# Patient Record
Sex: Male | Born: 1950 | Race: White | Hispanic: No | Smoking: Former smoker
Health system: Southern US, Community
[De-identification: ages and names within clinical notes are randomized; demographics above are authoritative.]

## PROBLEM LIST (undated history)

## (undated) DIAGNOSIS — G8929 Other chronic pain: Secondary | ICD-10-CM

## (undated) DIAGNOSIS — S79919A Unspecified injury of unspecified hip, initial encounter: Secondary | ICD-10-CM

## (undated) DIAGNOSIS — J309 Allergic rhinitis, unspecified: Secondary | ICD-10-CM

## (undated) DIAGNOSIS — R55 Syncope and collapse: Secondary | ICD-10-CM

## (undated) DIAGNOSIS — J069 Acute upper respiratory infection, unspecified: Secondary | ICD-10-CM

## (undated) DIAGNOSIS — M519 Unspecified thoracic, thoracolumbar and lumbosacral intervertebral disc disorder: Secondary | ICD-10-CM

## (undated) DIAGNOSIS — I639 Cerebral infarction, unspecified: Secondary | ICD-10-CM

## (undated) DIAGNOSIS — S069XAA Unspecified intracranial injury with loss of consciousness status unknown, initial encounter: Secondary | ICD-10-CM

## (undated) DIAGNOSIS — I10 Essential (primary) hypertension: Secondary | ICD-10-CM

## (undated) DIAGNOSIS — G9001 Carotid sinus syncope: Secondary | ICD-10-CM

## (undated) DIAGNOSIS — J020 Streptococcal pharyngitis: Secondary | ICD-10-CM

## (undated) DIAGNOSIS — I959 Hypotension, unspecified: Secondary | ICD-10-CM

## (undated) DIAGNOSIS — R040 Epistaxis: Secondary | ICD-10-CM

## (undated) DIAGNOSIS — S069X9A Unspecified intracranial injury with loss of consciousness of unspecified duration, initial encounter: Secondary | ICD-10-CM

## (undated) DIAGNOSIS — R42 Dizziness and giddiness: Secondary | ICD-10-CM

## (undated) DIAGNOSIS — R011 Cardiac murmur, unspecified: Secondary | ICD-10-CM

## (undated) DIAGNOSIS — M5136 Other intervertebral disc degeneration, lumbar region: Secondary | ICD-10-CM

## (undated) DIAGNOSIS — M1990 Unspecified osteoarthritis, unspecified site: Secondary | ICD-10-CM

## (undated) DIAGNOSIS — F329 Major depressive disorder, single episode, unspecified: Secondary | ICD-10-CM

## (undated) DIAGNOSIS — M51369 Other intervertebral disc degeneration, lumbar region without mention of lumbar back pain or lower extremity pain: Secondary | ICD-10-CM

## (undated) DIAGNOSIS — F32A Depression, unspecified: Secondary | ICD-10-CM

## (undated) DIAGNOSIS — F419 Anxiety disorder, unspecified: Secondary | ICD-10-CM

## (undated) DIAGNOSIS — K219 Gastro-esophageal reflux disease without esophagitis: Secondary | ICD-10-CM

## (undated) HISTORY — DX: Epistaxis: R04.0

## (undated) HISTORY — DX: Allergic rhinitis, unspecified: J30.9

## (undated) HISTORY — DX: Other chronic pain: G89.29

## (undated) HISTORY — DX: Unspecified intracranial injury with loss of consciousness status unknown, initial encounter: S06.9XAA

## (undated) HISTORY — DX: Unspecified injury of unspecified hip, initial encounter: S79.919A

## (undated) HISTORY — DX: Acute upper respiratory infection, unspecified: J06.9

## (undated) HISTORY — DX: Essential (primary) hypertension: I10

## (undated) HISTORY — DX: Streptococcal pharyngitis: J02.0

## (undated) HISTORY — DX: Cerebral infarction, unspecified: I63.9

## (undated) HISTORY — DX: Syncope and collapse: R55

## (undated) HISTORY — PX: PR UNLISTED PROCEDURE FEMUR/KNEE: 27599

## (undated) HISTORY — DX: Unspecified thoracic, thoracolumbar and lumbosacral intervertebral disc disorder: M51.9

## (undated) HISTORY — DX: Hypotension, unspecified: I95.9

## (undated) HISTORY — DX: Unspecified intracranial injury with loss of consciousness of unspecified duration, initial encounter: S06.9X9A

## (undated) HISTORY — PX: PR UNLISTED PROCEDURE HANDS/FINGERS: 26989

## (undated) HISTORY — DX: Cardiac murmur, unspecified: R01.1

## (undated) HISTORY — DX: Dizziness and giddiness: R42

## (undated) HISTORY — DX: Carotid sinus syncope: G90.01

## (undated) HISTORY — DX: Major depressive disorder, single episode, unspecified: F32.9

## (undated) HISTORY — DX: Depression, unspecified: F32.A

## (undated) HISTORY — PX: FRACTURE SURGERY: SHX138

## (undated) HISTORY — DX: Anxiety disorder, unspecified: F41.9

## (undated) HISTORY — DX: Gastro-esophageal reflux disease without esophagitis: K21.9

## (undated) MED ORDER — NADOLOL 40 MG OR TABS
ORAL_TABLET | ORAL | Status: AC
Start: 2015-02-27 — End: ?

## (undated) MED ORDER — EPINEPHRINE 0.3 MG/0.3ML IJ SOAJ
INTRAMUSCULAR | 0 refills | Status: AC
Start: 2022-10-22 — End: ?

## (undated) MED ORDER — NADOLOL 20 MG OR TABS
ORAL_TABLET | ORAL | Status: AC
Start: 2015-05-22 — End: ?

## (undated) MED ORDER — PREGABALIN 50 MG OR CAPS
ORAL_CAPSULE | ORAL | Status: AC
Start: 2020-06-07 — End: ?

## (undated) MED ORDER — DICLOFENAC SODIUM 50 MG OR TBEC
50.0000 mg | DELAYED_RELEASE_TABLET | Freq: Two times a day (BID) | ORAL | 0 refills | Status: AC
Start: 2022-10-22 — End: ?

## (undated) MED ORDER — NADOLOL 40 MG OR TABS
ORAL_TABLET | ORAL | Status: AC
Start: 2015-05-22 — End: ?

## (undated) MED ORDER — NADOLOL 20 MG OR TABS
20.0000 mg | ORAL_TABLET | Freq: Every day | ORAL | Status: AC
Start: 2015-05-08 — End: ?

## (undated) MED ORDER — PREDNISONE 50 MG OR TABS
ORAL_TABLET | ORAL | Status: AC
Start: 2020-03-29 — End: ?

## (undated) MED ORDER — PREGABALIN 100 MG OR CAPS
ORAL_CAPSULE | ORAL | Status: AC
Start: 2020-06-07 — End: ?

## (undated) MED ORDER — VOLTAREN 1 % TD GEL
TRANSDERMAL | Status: AC
Start: 2015-11-05 — End: ?

## (undated) DEATH — deceased

---

## 1998-03-28 ENCOUNTER — Ambulatory Visit (HOSPITAL_COMMUNITY): Admission: RE | Admit: 1998-03-28 | Discharge: 1998-03-28 | Payer: Self-pay | Admitting: Internal Medicine

## 2000-08-03 HISTORY — PX: PR ANES; COLONOSCOPY: AN44388

## 2002-12-15 ENCOUNTER — Ambulatory Visit (HOSPITAL_COMMUNITY): Admission: RE | Admit: 2002-12-15 | Discharge: 2002-12-15 | Payer: Self-pay | Admitting: Internal Medicine

## 2005-03-16 ENCOUNTER — Ambulatory Visit (HOSPITAL_COMMUNITY): Admission: RE | Admit: 2005-03-16 | Discharge: 2005-03-16 | Payer: Self-pay | Admitting: *Deleted

## 2005-03-16 ENCOUNTER — Ambulatory Visit (HOSPITAL_BASED_OUTPATIENT_CLINIC_OR_DEPARTMENT_OTHER): Admission: RE | Admit: 2005-03-16 | Discharge: 2005-03-16 | Payer: Self-pay | Admitting: *Deleted

## 2007-11-18 HISTORY — PX: PR ANES; COLONOSCOPY & POLYPECTOMY: AN44392

## 2008-08-31 ENCOUNTER — Other Ambulatory Visit (HOSPITAL_BASED_OUTPATIENT_CLINIC_OR_DEPARTMENT_OTHER): Payer: Self-pay | Admitting: Surgery of the Hand

## 2008-09-04 LAB — PATHOLOGY, SURGICAL

## 2009-09-30 ENCOUNTER — Encounter (INDEPENDENT_AMBULATORY_CARE_PROVIDER_SITE_OTHER): Payer: Self-pay | Admitting: Cardiovascular Disease

## 2009-10-02 ENCOUNTER — Ambulatory Visit (INDEPENDENT_AMBULATORY_CARE_PROVIDER_SITE_OTHER): Payer: Self-pay | Admitting: Cardiovascular Disease

## 2009-10-03 ENCOUNTER — Ambulatory Visit (INDEPENDENT_AMBULATORY_CARE_PROVIDER_SITE_OTHER): Payer: PPO | Admitting: Cardiovascular Disease

## 2009-10-03 VITALS — BP 120/90 | HR 70 | Wt 221.0 lb

## 2009-10-03 NOTE — Progress Notes (Signed)
Cardiovascular Clinical Evaluation Note    Primary Care Provider: Romie Jumper, MD    Referring Provider: No ref. provider found    DOB:  02-May-1951    CHIEF COMPLAINT:  hypertension    HISTORY OF PRESENT ILLNESS:  Patient reports feeling well with no sense of dizziness, lightheadedness or syncope. He had previous hypotension with syncope due to medications for high blood pressure. He is on a very carefully instructed regimen it seems to be keeping blood pressure controlled without causing symptomatic hypotension.  He brings with him a series of blood pressures all of which are in the normal range except for one with a diastolic pressure was elevated following weightlifting.  He denies any chest pain or shortness of breath. There've been no palpitations.    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 Labetalol HCl 200 MG OR TABS 1 tablet daily   . Lisinopril 10 MG OR TABS 1 tablet in the am 2 tablet in the pm         ALLERGIES:  Review of patient's allergies indicates no known allergies.      PSFH: no hospitalizations or recurrent illnesses. He is considerably less stressed because of employment issues.    REVIEW OF SYSTEMS:  See Cardiovascular HPI for pertinent positives and negatives.   RESPIRATORY:  No sputum, hemoptysis.    PHYSICAL EXAM    CONSTITUTIONAL:  This is a well-nourished male in no distress.   BP 120/90  Pulse 70  Wt 221 lb (100.245 kg)  NECK:  Jugular venous pressure normal.  Thyroid is not enlarged.  RESPIRATORY:   Clear to percussion and auscultation.  CARDIOVASCULAR:  regular rhythm.  S1 and S2 normal. No S3, S4, no murmur or gallop.   Carotid upstrokes are brisk without bruits. GASTROINTESTINAL: Abdomen without masses or tenderness. No hepatosplenomegaly.  EXTREMITIES:  No clubbing, cyanosis, edema or tenderness noted.      IMPRESSION :   1. Hypertension  2. Episodic hypotension with syncope  3. Carotid sinus hypersensitivity    PLAN:    Continue current medications. No changes were made. He will continue to monitor blood pressures. I've encouraged more aerobic activity rather than the weight lifting.    Electronically signed by Idamae Lusher, MD    10/03/2009  5:51 PM    CC:   Romie Jumper, MD  No ref. provider found

## 2010-01-21 ENCOUNTER — Other Ambulatory Visit (INDEPENDENT_AMBULATORY_CARE_PROVIDER_SITE_OTHER): Payer: Self-pay | Admitting: Cardiovascular Disease

## 2010-01-21 LAB — COMPREHENSIVE METABOLIC PANEL
ALT (GPT): 50
AST (GOT): 39
Albumin: 4.7
Alkaline Phosphatase (Total): 78
Anion Gap: 13.5
Bilirubin (Total): 0.8
Calcium: 9.9
Carbon Dioxide, Total: 26
Chloride: 107
Creatinine: 1
Glucose: 113 mg/dl (ref 62–125)
Potassium: 4.5
Protein (Total): 7.1
Sodium: 142
Urea Nitrogen: 21

## 2010-01-21 LAB — PR LIPID PANEL, ONSITE
Cholesterol (LDL): 115 mg/dL (ref ?–130)
HDL Cholesterol: 113 mg/dL (ref 40–?)
Total Cholesterol: 246 mg/dL — AB (ref ?–200)
Triglyceride: 91 mg/dL (ref ?–150)

## 2010-01-21 LAB — CBC (HEMOGRAM)
Hematocrit: 46.5 %
Hemoglobin: 16.3
MCH: 33.9 — ABNORMAL HIGH
MCHC: 35.1
MCV: 96.6
Platelet Count: 212
RBC: 4.82
RDW-CV: 14
WBC: 5.38

## 2010-01-21 LAB — THYROID STIMULATING HORMONE: Thyroid Stimulating Hormone: 1.377

## 2010-02-11 ENCOUNTER — Other Ambulatory Visit (INDEPENDENT_AMBULATORY_CARE_PROVIDER_SITE_OTHER): Payer: Self-pay | Admitting: Cardiovascular Disease

## 2010-02-11 MED ORDER — LISINOPRIL 10 MG OR TABS
ORAL_TABLET | ORAL | Status: DC
Start: 2010-02-11 — End: 2010-06-24

## 2010-02-11 NOTE — Telephone Encounter (Signed)
Follow up scheduled for 03/11/10.

## 2010-02-11 NOTE — Telephone Encounter (Signed)
Last seen by Dr. Aundria Rud 10/03/09    Doctor recommends follow up in (did not mention) from the date above

## 2010-03-11 ENCOUNTER — Ambulatory Visit (INDEPENDENT_AMBULATORY_CARE_PROVIDER_SITE_OTHER): Payer: PPO | Admitting: Cardiovascular Disease

## 2010-03-11 VITALS — BP 144/98 | HR 68 | Wt 213.0 lb

## 2010-03-11 DIAGNOSIS — M542 Cervicalgia: Secondary | ICD-10-CM

## 2010-03-11 HISTORY — DX: Cervicalgia: M54.2

## 2010-03-11 NOTE — Progress Notes (Signed)
Cardiovascular Clinical Evaluation Note    Primary Care Provider: Romie Jumper, MD    Referring Provider: No ref. provider found    DOB:  June 15, 1951    CHIEF COMPLAINT:  Hyper and  hypotension    HISTORY OF PRESENT ILLNESS:  Patient reports that he's had  Unusual Pain for the last 2 months. 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 He also has chronic lower back pain. Been going to physical therapy for the last 2 months with some improvement but he is not back to baseline.  Blood pressure in the office today is elevated. He brings with him blood pressures from home the lowest of which is approximately 112/70.  He was taking Advil with possible rise in blood pressure due to bad although I suspect most of the blood pressure elevation is simply due to pain.  He continues on labetalol and lisinopril.    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 Labetalol HCl 200 MG OR TABS 1 tablet daily   . Lisinopril 10 MG Oral Tab 1 tablet in the am 2 tablet in the pm         ALLERGIES:  Review of patient's allergies indicates no known allergies.      PSFH: No new cardiac issues. He has not had any syncope or presyncope.    REVIEW OF SYSTEMS:  See Cardiovascular HPI for pertinent positives and negatives.   RESPIRATORY:  No sputum, hemoptysis.    PHYSICAL EXAM    CONSTITUTIONAL:  This is a well-nourished male in no distress.   BP 144/98  Pulse 68  Wt 213 lb (96.616 kg)  NECK:  Jugular venous pressure normal.  Thyroid is not enlarged.  RESPIRATORY:   Clear to percussion and auscultation.  CARDIOVASCULAR:  Regular rhythm.  S1 and S2 normal. No  S3, S4, No murmur gallop.  Carotid upstrokes are brisk without bruits. GASTROINTESTINAL: Abdomen without masses or tenderness. No hepatosplenomegaly.  EXTREMITIES:  No clubbing, cyanosis, edema or tenderness noted.      IMPRESSION :   1. Hypertension, exacerbated by pain  2. Episodic hypotension    PLAN:    No changes were made at this time. I want him to monitor his blood pressures. I think that as his pain diminished over time, blood pressures will return to previous status.    Electronically signed by Idamae Lusher, MD    03/11/2010  12:00 PM    CC:   Romie Jumper, MD  No ref. provider found

## 2010-05-30 ENCOUNTER — Other Ambulatory Visit (INDEPENDENT_AMBULATORY_CARE_PROVIDER_SITE_OTHER): Payer: Self-pay | Admitting: Cardiovascular Disease

## 2010-05-30 MED ORDER — LABETALOL HCL 200 MG OR TABS
ORAL_TABLET | ORAL | Status: DC
Start: 2010-05-30 — End: 2011-06-09

## 2010-05-30 NOTE — Telephone Encounter (Signed)
Last seen by Dr. Aundria Rud 03/11/10    Doctor recommends follow up in (does not specify)  from the date above

## 2010-06-24 ENCOUNTER — Ambulatory Visit (INDEPENDENT_AMBULATORY_CARE_PROVIDER_SITE_OTHER): Payer: No Typology Code available for payment source | Admitting: Cardiovascular Disease

## 2010-06-24 VITALS — BP 146/106 | HR 84 | Wt 217.0 lb

## 2010-06-24 NOTE — Progress Notes (Signed)
Cardiovascular Clinical Evaluation Note    Primary Care Provider: Romie Jumper, MD    Referring Provider: No ref. provider found    DOB:  [birth year]    CHIEF COMPLAINT:  Hypertension, hypotension, protracted cervical pain    HISTORY OF PRESENT ILLNESS:  Patient's blood pressures are extremely volatile. His low was approximately 110 systolic but in the office today he is 146/100. He had no dizziness, syncope or presyncope. He denies any chest pain or shortness of breath.    He is having persistent pain from the left cervical spine. He is currently undergoing traction. This has however been present for about 6 months and has been fairly debilitating. Chronic pain is likely contributing to his elevated blood pressure.    PROBLEM LIST:  Patient Active Problem List   Diagnoses Date Noted   . CERVICALGIA 03/11/2010     Priority: High   . Syncope      Priority: High   . HYPOTENSION       Priority: High   . HYPERTENSION       Priority: High   . Carotid Sinus Hypersensitivity      Priority: High   . URI (upper respiratory infection)      Priority: High       MEDICATIONS:    Current Outpatient Prescriptions   Medication Sig   . Carisoprodol 350 MG Oral Tab 1 TABLET  DAILY AS NEEDED   . HYDROCODONE-APAP (VICODIN) 5-500 MG Oral Tab as needed   . Labetalol HCl 200 MG Oral Tab 1 tablet daily   . Lisinopril 10 MG Oral Tab Two tabs twice daily       ALLERGIES:  Review of patient's allergies indicates no known allergies.      PSFH: no hospitalizations. 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    REVIEW OF SYSTEMS:  See Cardiovascular HPI for pertinent positives and negatives.   RESPIRATORY:  No sputum, hemoptysis.    PHYSICAL EXAM    CONSTITUTIONAL:  This is a well-nourished male in no distress.   BP 146/106  Pulse 84  Wt 217 lb (98.431 kg)  NECK:  Jugular venous pressure normal.  Thyroid is not enlarged.  RESPIRATORY:   Clear to percussion and auscultation.  CARDIOVASCULAR:  regular rhythm.  S1 and S2  normal. No S3, S4, no murmur or gallop.  Carotid upstrokes are brisk without bruits. GASTROINTESTINAL: Abdomen without masses or tenderness. No hepatosplenomegaly.  EXTREMITIES:  No clubbing, cyanosis, edema or tenderness noted.      IMPRESSION :   1. Volatile blood pressure with components of hyper and hypotension  2. Protracted cervical spine pain    PLAN:    He has increased his lisinopril to 20 mg b.i.d. He has a prior history of syncope due to hypotension and we have to be careful about further increasing the dose. I would prefer that we achieve some resolution of the cervical spine pain which I think will also help mitigate his blood pressure elevation.    Electronically signed by Idamae Lusher, MD    06/24/2010  12:36 PM    CC:   Romie Jumper, MD  No ref. provider found

## 2010-09-02 ENCOUNTER — Other Ambulatory Visit (INDEPENDENT_AMBULATORY_CARE_PROVIDER_SITE_OTHER): Payer: Self-pay | Admitting: Cardiovascular Disease

## 2010-09-02 MED ORDER — LISINOPRIL 10 MG OR TABS
ORAL_TABLET | ORAL | Status: DC
Start: 2010-09-02 — End: 2011-07-02

## 2010-09-02 NOTE — Telephone Encounter (Signed)
Last seen by Dr. Aundria Rud on 06/24/2010    Doctor recommends follow up in NO DATE IN CHART  from the date above

## 2010-09-25 ENCOUNTER — Ambulatory Visit (INDEPENDENT_AMBULATORY_CARE_PROVIDER_SITE_OTHER): Payer: No Typology Code available for payment source | Admitting: Cardiovascular Disease

## 2010-10-13 ENCOUNTER — Ambulatory Visit (INDEPENDENT_AMBULATORY_CARE_PROVIDER_SITE_OTHER): Payer: No Typology Code available for payment source | Admitting: Cardiovascular Disease

## 2010-10-13 VITALS — BP 168/120 | HR 72 | Wt 217.0 lb

## 2010-10-13 NOTE — Progress Notes (Signed)
Cardiovascular Clinical Evaluation Note    Primary Care Provider: Romie Jumper, MD    Referring Provider: No ref. provider found    DOB:  03-09-1951    CHIEF COMPLAINT:  Fluctuating blood pressure    HISTORY OF PRESENT ILLNESS:  Patient demonstrates a high blood pressure today. However he brings with him measurements that are as low as in the high 80s systolic. He is a prior history of syncope due to hypotension.  He continues to have left neck pain with numbness. He has gone through extensive treatment for this but is not as yet seen a Midwife.  He denies any chest pain or shortness of breath. He has not had any recurrence of syncope.  He did fall and injure himself in the left ribs. This was not due to loss of consciousness but to a slippery surface      PROBLEM LIST:  Patient Active Problem List   Diagnoses Date Noted   . CERVICALGIA 03/11/2010     Priority: High   . Syncope      Priority: High   . HYPOTENSION       Priority: High   . HYPERTENSION       Priority: High   . Carotid Sinus Hypersensitivity      Priority: High   . URI (upper respiratory infection)      Priority: High       MEDICATIONS:    Current Outpatient Prescriptions   Medication Sig   . Carisoprodol 350 MG Oral Tab 1 TABLET  DAILY AS NEEDED   . HYDROCODONE-APAP (VICODIN) 5-500 MG Oral Tab as needed   . Labetalol HCl 200 MG Oral Tab 1 tablet daily   . Lisinopril 10 MG Oral Tab Two tabs twice daily       ALLERGIES:  Review of patient's allergies indicates no known allergies.      PSFH: No hospitalizations or emergency room visits. He has been undergoing therapy for his neck spasm with electrical stimulation.    REVIEW OF SYSTEMS:  See Cardiovascular HPI for pertinent positives and negatives.   RESPIRATORY:  No sputum, hemoptysis.    PHYSICAL EXAM    CONSTITUTIONAL:  This is a well-nourished male in no distress.   BP 168/120  Pulse 72  Wt 217 lb (98.431 kg)  NECK:  Jugular venous pressure normal.  Thyroid is not enlarged.  RESPIRATORY:    Clear to percussion and auscultation.  CARDIOVASCULAR:  regular rhythm.  S1 and S2 normal. No S3, S4, no murmur or gallop.  Carotid upstrokes are brisk without bruits. GASTROINTESTINAL: Abdomen without masses or tenderness. No hepatosplenomegaly.  EXTREMITIES:  No clubbing, cyanosis, edema or tenderness noted.      IMPRESSION :   1. Hypertension: this is a dramatic on occasion and I think may be related to pain that he suffers from his cervical nerve impingement  2. Hypotension: blood pressures in the 80 systolic on current medications fortunately without syncope    PLAN:    I think it's time for him to be seen by neurosurgery to see if there is a solution to the neck pain. This has gone on for sufficiently long period of time and I think there is probably a mechanical explanation  No medication changes were made.    Electronically signed by Idamae Lusher, MD    10/13/2010  1:57 PM    CC:   Romie Jumper, MD  No ref. provider found

## 2011-02-24 ENCOUNTER — Ambulatory Visit (INDEPENDENT_AMBULATORY_CARE_PROVIDER_SITE_OTHER): Payer: No Typology Code available for payment source | Admitting: Cardiovascular Disease

## 2011-02-24 VITALS — BP 150/98 | HR 68 | Wt 213.0 lb

## 2011-02-24 NOTE — Progress Notes (Signed)
Cardiovascular Clinical Evaluation Note    Primary Care Provider: No primary provider on file.    Referring Provider: No ref. provider found    DOB:  1950-09-07    CHIEF COMPLAINT:  Hypertension, severe cervical spine related pain with radiculopathy, hypotension and syncope    HISTORY OF PRESENT ILLNESS:  Patient continues to report a wide spread of blood pressures. One thing however is quite consistent in his case. His pain free baseline blood pressures are typically normal with a low of 106/61. His elevated blood pressures are clearly related to pain. He has documented this very clearly.  Pushing further antihypertensive therapy but cement risk of another fall which could be highly injurious.    He is pursuing a fairly aggressive therapy for his neck. He has received several injections in bleed she is at least 50% better. 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  He is considering undergoing acupuncture.    PROBLEM LIST:  Patient Active Problem List   Diagnoses Date Noted   . CERVICALGIA [723.1] 03/11/2010   . Syncope [780.2]    . HYPOTENSION  [458.9]    . HYPERTENSION  [401.9]    . 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 URI (upper respiratory infection) [465.9]        MEDICATIONS:    Current Outpatient Prescriptions   Medication Sig   . Carisoprodol 350 MG Oral Tab 1 TABLET  DAILY AS NEEDED   . HYDROCODONE-APAP (VICODIN) 5-500 MG Oral Tab as needed   . Labetalol HCl 200 MG Oral Tab 1 tablet daily   . Lisinopril 10 MG Oral Tab Two tabs twice daily       ALLERGIES:  Review of patient's allergies indicates no known allergies.      PSFH: no hospitalizations or emergency room visits. His lower back is also a problem and is this is probably exacerbated by his carriage  from his cervical spine disease.    REVIEW OF SYSTEMS:  See Cardiovascular HPI for pertinent positives and negatives.   RESPIRATORY:  No sputum, hemoptysis.    PHYSICAL EXAM    CONSTITUTIONAL:  This is  a well-nourished male in no distress.   BP 150/98  Pulse 68  Wt 213 lb (96.616 kg)  NECK:  Jugular venous pressure normal.  Thyroid is not enlarged.  RESPIRATORY:   Clear to percussion and auscultation.  CARDIOVASCULAR:  regular rhythm.  S1 and S2 normal. No S3, S4, no murmur or gallop.  Carotid upstrokes are brisk without bruits. GASTROINTESTINAL: Abdomen without masses or tenderness. No hepatosplenomegaly.  EXTREMITIES:  No clubbing, cyanosis, edema or tenderness noted.      IMPRESSION :   1. Hypertension, reactive and typically related to pain  2. Previous hypotension with syncope  3. 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 His blood pressures are already dropping to barely over 100. He needs pain relief and I think that this will allow for a drop in blood pressure. He seems to be improving on the current management and is going to continue to pursue this.     Electronically signed by Olene Craven, MD    02/24/2011  12:45 PM    CC:   No primary provider on file.  No ref. provider found

## 2011-03-12 ENCOUNTER — Telehealth (INDEPENDENT_AMBULATORY_CARE_PROVIDER_SITE_OTHER): Payer: Self-pay | Admitting: Cardiovascular Disease

## 2011-03-12 NOTE — Telephone Encounter (Addendum)
Phone call from patient requesting call from Dr DV Aundria Rud.  Patient explains he was scheduled for the second time for a procedure on his neck and it had to be cancelled because his blood pressure was too high.  He had doubled his blood pressure medications that morning and taken pain medication but his pressure was still too high.  Please have Dr DV Aundria Rud call him on his cell phone @ [phone].    Will give message to Dr DV Aundria Rud.     Discussed with Dr DV Aundria Rud please call patient back explain that he has reactive blood pressure to the pain and to the procedure.  His blood pressure needs to be managed by the anesthesiologist and physician doing the procedure.  Patient can not be prescribed anymore medication prior to procedure because he becomes hypotensive and is had risk for syncopal episode.  Called patient back with instructions.

## 2011-05-01 ENCOUNTER — Ambulatory Visit (HOSPITAL_BASED_OUTPATIENT_CLINIC_OR_DEPARTMENT_OTHER): Payer: No Typology Code available for payment source | Attending: Pain Medicine | Admitting: Pain Medicine

## 2011-05-01 DIAGNOSIS — M479 Spondylosis, unspecified: Secondary | ICD-10-CM | POA: Insufficient documentation

## 2011-05-01 DIAGNOSIS — M542 Cervicalgia: Secondary | ICD-10-CM | POA: Insufficient documentation

## 2011-05-08 ENCOUNTER — Ambulatory Visit (HOSPITAL_BASED_OUTPATIENT_CLINIC_OR_DEPARTMENT_OTHER): Payer: No Typology Code available for payment source | Attending: Pain Medicine | Admitting: Pain Medicine

## 2011-05-08 DIAGNOSIS — M47812 Spondylosis without myelopathy or radiculopathy, cervical region: Secondary | ICD-10-CM | POA: Insufficient documentation

## 2011-05-12 ENCOUNTER — Ambulatory Visit (HOSPITAL_BASED_OUTPATIENT_CLINIC_OR_DEPARTMENT_OTHER): Payer: No Typology Code available for payment source | Attending: Pain Medicine | Admitting: Pain Medicine

## 2011-05-12 ENCOUNTER — Ambulatory Visit: Payer: No Typology Code available for payment source | Admitting: Pain Medicine

## 2011-05-12 DIAGNOSIS — M47812 Spondylosis without myelopathy or radiculopathy, cervical region: Secondary | ICD-10-CM | POA: Insufficient documentation

## 2011-05-13 ENCOUNTER — Ambulatory Visit: Payer: No Typology Code available for payment source | Admitting: Pain Medicine

## 2011-06-09 ENCOUNTER — Other Ambulatory Visit (INDEPENDENT_AMBULATORY_CARE_PROVIDER_SITE_OTHER): Payer: Self-pay | Admitting: Cardiovascular Disease

## 2011-06-10 MED ORDER — LABETALOL HCL 200 MG OR TABS
ORAL_TABLET | ORAL | Status: DC
Start: 2011-06-09 — End: 2012-03-04

## 2011-06-10 NOTE — Telephone Encounter (Signed)
Last seen by Dr. Aundria Rud on 02/24/2011    Doctor recommends follow up in no date in chart from the date above

## 2011-06-12 ENCOUNTER — Ambulatory Visit (HOSPITAL_BASED_OUTPATIENT_CLINIC_OR_DEPARTMENT_OTHER): Payer: No Typology Code available for payment source | Attending: Pain Medicine | Admitting: Pain Medicine

## 2011-06-12 DIAGNOSIS — M542 Cervicalgia: Secondary | ICD-10-CM | POA: Insufficient documentation

## 2011-06-12 DIAGNOSIS — M25519 Pain in unspecified shoulder: Secondary | ICD-10-CM | POA: Insufficient documentation

## 2011-06-12 DIAGNOSIS — M47817 Spondylosis without myelopathy or radiculopathy, lumbosacral region: Secondary | ICD-10-CM | POA: Insufficient documentation

## 2011-06-19 ENCOUNTER — Ambulatory Visit (HOSPITAL_BASED_OUTPATIENT_CLINIC_OR_DEPARTMENT_OTHER): Payer: No Typology Code available for payment source | Attending: Pain Medicine | Admitting: Pain Medicine

## 2011-06-19 DIAGNOSIS — M47817 Spondylosis without myelopathy or radiculopathy, lumbosacral region: Secondary | ICD-10-CM | POA: Insufficient documentation

## 2011-07-02 ENCOUNTER — Other Ambulatory Visit (INDEPENDENT_AMBULATORY_CARE_PROVIDER_SITE_OTHER): Payer: Self-pay | Admitting: Cardiovascular Disease

## 2011-07-02 MED ORDER — LISINOPRIL 10 MG OR TABS
ORAL_TABLET | ORAL | Status: DC
Start: 2011-07-02 — End: 2011-10-07

## 2011-07-02 NOTE — Telephone Encounter (Signed)
Last seen by Dr Aundria Rud 02/24/11, RTC 6 months.

## 2011-07-03 ENCOUNTER — Ambulatory Visit (HOSPITAL_BASED_OUTPATIENT_CLINIC_OR_DEPARTMENT_OTHER): Payer: No Typology Code available for payment source | Attending: Pain Medicine | Admitting: Pain Medicine

## 2011-07-03 DIAGNOSIS — M47817 Spondylosis without myelopathy or radiculopathy, lumbosacral region: Secondary | ICD-10-CM | POA: Insufficient documentation

## 2011-08-04 HISTORY — PX: PR UNLISTED PROCEDURE SPINE: 22899

## 2011-08-28 ENCOUNTER — Ambulatory Visit (HOSPITAL_BASED_OUTPATIENT_CLINIC_OR_DEPARTMENT_OTHER): Payer: No Typology Code available for payment source | Attending: Pain Medicine | Admitting: Pain Medicine

## 2011-08-28 DIAGNOSIS — M5380 Other specified dorsopathies, site unspecified: Secondary | ICD-10-CM | POA: Insufficient documentation

## 2011-09-08 ENCOUNTER — Ambulatory Visit (INDEPENDENT_AMBULATORY_CARE_PROVIDER_SITE_OTHER): Payer: No Typology Code available for payment source | Admitting: Cardiovascular Disease

## 2011-10-02 ENCOUNTER — Ambulatory Visit (HOSPITAL_BASED_OUTPATIENT_CLINIC_OR_DEPARTMENT_OTHER): Payer: No Typology Code available for payment source | Attending: Pain Medicine | Admitting: Pain Medicine

## 2011-10-02 DIAGNOSIS — M542 Cervicalgia: Secondary | ICD-10-CM | POA: Insufficient documentation

## 2011-10-07 ENCOUNTER — Other Ambulatory Visit (INDEPENDENT_AMBULATORY_CARE_PROVIDER_SITE_OTHER): Payer: Self-pay | Admitting: Cardiovascular Disease

## 2011-10-08 MED ORDER — LISINOPRIL 10 MG OR TABS
ORAL_TABLET | ORAL | Status: DC
Start: 2011-10-07 — End: 2012-01-13

## 2011-10-08 NOTE — Telephone Encounter (Signed)
Last seen by Dr. Aundria Rud on 02/24/11    Doctor recommends follow-up in n/a from the date above.    Last Lipid was 01/16/10

## 2011-10-29 ENCOUNTER — Encounter (INDEPENDENT_AMBULATORY_CARE_PROVIDER_SITE_OTHER): Payer: Self-pay | Admitting: Cardiovascular Disease

## 2011-10-29 ENCOUNTER — Ambulatory Visit (INDEPENDENT_AMBULATORY_CARE_PROVIDER_SITE_OTHER): Payer: No Typology Code available for payment source | Admitting: Cardiovascular Disease

## 2011-10-29 VITALS — BP 142/94 | HR 74 | Ht 76.0 in | Wt 223.0 lb

## 2011-10-29 NOTE — Patient Instructions (Signed)
No changes

## 2011-11-02 NOTE — Progress Notes (Signed)
Cardiovascular Clinical Evaluation Note    Primary Care Provider: No primary provider on file.    Referring Provider: No ref. provider found    DOB:  03-18-1951    CHIEF COMPLAINT:  Hypertension, hypotension    HISTORY OF PRESENT ILLNESS:  Patient has had no extremes of blood pressure. This is in part due to markedly improve pain control. He has undergone spinal nerve ablation on C3-4 and 5 and L4 and 5 and S1.  Overall his pain is under much better control which limits the hypertension. He has not had any symptomatic hypotension.  Nonetheless diastolic pressures have been higher than ideal at approximately 90-92.    No complaints of chest pain or shortness of breath.  He has had no syncope or presyncope.    PROBLEM LIST:  Patient Active Problem List    Diagnosis Date Noted   . CERVICALGIA [723.1] 03/11/2010   . Syncope [780.2]    . HYPOTENSION  [458.9]    . HYPERTENSION  [401.9]    . 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 URI (upper respiratory infection) [465.9]        MEDICATIONS:    Current Outpatient Prescriptions   Medication Sig   . HYDROCODONE-APAP (VICODIN) 5-500 MG Oral Tab as needed   . Labetalol HCl 200 MG Oral Tab TAKE ONE TABLET ONCE DAILY   . Lisinopril 10 MG Oral Tab TAKE TWO TABLETS BY MOUTH TWICE DAILY       ALLERGIES:  Review of patient's allergies indicates no known allergies.      PSFH: there've been no hospitalizations. His  nerve ablations have been done as an outpatient    REVIEW OF SYSTEMS:  See Cardiovascular HPI for pertinent positives and negatives.   RESPIRATORY:  No sputum, hemoptysis.    PHYSICAL EXAM    CONSTITUTIONAL:  This is a well-nourished male in no distress.   BP 142/94  Pulse 74  Ht 6\' 4"  (1.93 m)  Wt 223 lb (101.152 kg)  BMI 27.14 kg/m2  NECK:  Jugular venous pressure normal.  Thyroid is not enlarged.  RESPIRATORY:   Clear to percussion and auscultation.  CARDIOVASCULAR:  regular rhythm.  S1 and S2 normal. No S3, S4, no murmur or gallop.  Carotid upstrokes are brisk  without bruits. GASTROINTESTINAL: Abdomen without masses or tenderness. No hepatosplenomegaly.  EXTREMITIES:  No clubbing, cyanosis, edema or tenderness noted.      IMPRESSION :   1. Hypertension: I think this is suboptimally controlled at present.  2. Hypotension: no recurrence  3. Cervical spine pain, improved    PLAN:    8 we'll wait until he has further improvement with the neuralgia and then push a little harder on the antihypertensive therapy.    Electronically signed by Olene Craven, MD    11/02/2011  8:19 AM    CC:   No primary provider on file.  No ref. provider found

## 2012-01-13 ENCOUNTER — Other Ambulatory Visit (INDEPENDENT_AMBULATORY_CARE_PROVIDER_SITE_OTHER): Payer: Self-pay | Admitting: Cardiovascular Disease

## 2012-01-13 MED ORDER — LISINOPRIL 10 MG OR TABS
ORAL_TABLET | ORAL | Status: DC
Start: 2012-01-13 — End: 2012-11-05

## 2012-01-13 NOTE — Telephone Encounter (Signed)
Last seen by Dr. Aundria Rud 10/29/2011    Doctor recommends follow-up in 6 months from the date above.    Lipids done 01/16/2010

## 2012-01-15 ENCOUNTER — Ambulatory Visit (HOSPITAL_BASED_OUTPATIENT_CLINIC_OR_DEPARTMENT_OTHER): Payer: No Typology Code available for payment source | Attending: Anesthesiology | Admitting: Anesthesiology

## 2012-01-15 DIAGNOSIS — M545 Low back pain, unspecified: Secondary | ICD-10-CM | POA: Insufficient documentation

## 2012-01-15 DIAGNOSIS — Z79899 Other long term (current) drug therapy: Secondary | ICD-10-CM | POA: Insufficient documentation

## 2012-01-15 DIAGNOSIS — M129 Arthropathy, unspecified: Secondary | ICD-10-CM | POA: Insufficient documentation

## 2012-01-15 DIAGNOSIS — M542 Cervicalgia: Secondary | ICD-10-CM | POA: Insufficient documentation

## 2012-01-27 ENCOUNTER — Ambulatory Visit (HOSPITAL_BASED_OUTPATIENT_CLINIC_OR_DEPARTMENT_OTHER): Payer: No Typology Code available for payment source | Attending: Anesthesiology | Admitting: Anesthesiology

## 2012-01-27 DIAGNOSIS — M47812 Spondylosis without myelopathy or radiculopathy, cervical region: Secondary | ICD-10-CM | POA: Insufficient documentation

## 2012-01-27 DIAGNOSIS — M542 Cervicalgia: Secondary | ICD-10-CM | POA: Insufficient documentation

## 2012-02-10 ENCOUNTER — Ambulatory Visit (HOSPITAL_BASED_OUTPATIENT_CLINIC_OR_DEPARTMENT_OTHER): Payer: No Typology Code available for payment source | Attending: Anesthesiology | Admitting: Anesthesiology

## 2012-02-10 DIAGNOSIS — M47812 Spondylosis without myelopathy or radiculopathy, cervical region: Secondary | ICD-10-CM | POA: Insufficient documentation

## 2012-03-04 ENCOUNTER — Other Ambulatory Visit (INDEPENDENT_AMBULATORY_CARE_PROVIDER_SITE_OTHER): Payer: Self-pay | Admitting: Cardiovascular Disease

## 2012-03-04 MED ORDER — LABETALOL HCL 200 MG OR TABS
ORAL_TABLET | ORAL | Status: DC
Start: 2012-03-04 — End: 2012-06-02

## 2012-03-04 NOTE — Telephone Encounter (Signed)
Last seen by Dr. Aundria Rud 10/29/11    Doctor recommends follow up in 6 months from the date above

## 2012-03-11 ENCOUNTER — Ambulatory Visit (HOSPITAL_BASED_OUTPATIENT_CLINIC_OR_DEPARTMENT_OTHER): Payer: No Typology Code available for payment source | Attending: Anesthesiology | Admitting: Anesthesiology

## 2012-03-11 DIAGNOSIS — IMO0001 Reserved for inherently not codable concepts without codable children: Secondary | ICD-10-CM | POA: Insufficient documentation

## 2012-03-11 DIAGNOSIS — M542 Cervicalgia: Secondary | ICD-10-CM | POA: Insufficient documentation

## 2012-03-11 DIAGNOSIS — M545 Low back pain, unspecified: Secondary | ICD-10-CM | POA: Insufficient documentation

## 2012-03-11 DIAGNOSIS — M129 Arthropathy, unspecified: Secondary | ICD-10-CM | POA: Insufficient documentation

## 2012-03-30 ENCOUNTER — Ambulatory Visit (HOSPITAL_BASED_OUTPATIENT_CLINIC_OR_DEPARTMENT_OTHER): Payer: No Typology Code available for payment source | Attending: Anesthesiology | Admitting: Anesthesiology

## 2012-03-30 DIAGNOSIS — M47817 Spondylosis without myelopathy or radiculopathy, lumbosacral region: Secondary | ICD-10-CM | POA: Insufficient documentation

## 2012-06-02 ENCOUNTER — Other Ambulatory Visit (INDEPENDENT_AMBULATORY_CARE_PROVIDER_SITE_OTHER): Payer: Self-pay | Admitting: Cardiovascular Disease

## 2012-06-03 MED ORDER — LABETALOL HCL 200 MG OR TABS
ORAL_TABLET | ORAL | Status: DC
Start: 2012-06-02 — End: 2012-07-14

## 2012-06-03 NOTE — Telephone Encounter (Signed)
Last seen by Dr. Aundria Rud 10/29/2011    Doctor did not specify when to follow-up    Appointment scheduled 06/07/2012

## 2012-06-07 ENCOUNTER — Ambulatory Visit (INDEPENDENT_AMBULATORY_CARE_PROVIDER_SITE_OTHER): Payer: No Typology Code available for payment source | Admitting: Cardiovascular Disease

## 2012-06-24 ENCOUNTER — Ambulatory Visit (HOSPITAL_BASED_OUTPATIENT_CLINIC_OR_DEPARTMENT_OTHER): Payer: No Typology Code available for payment source | Attending: Anesthesiology | Admitting: Anesthesiology

## 2012-06-24 DIAGNOSIS — IMO0001 Reserved for inherently not codable concepts without codable children: Secondary | ICD-10-CM | POA: Insufficient documentation

## 2012-06-24 DIAGNOSIS — M129 Arthropathy, unspecified: Secondary | ICD-10-CM | POA: Insufficient documentation

## 2012-06-24 DIAGNOSIS — M542 Cervicalgia: Secondary | ICD-10-CM | POA: Insufficient documentation

## 2012-06-24 DIAGNOSIS — M545 Low back pain, unspecified: Secondary | ICD-10-CM | POA: Insufficient documentation

## 2012-07-14 ENCOUNTER — Encounter (INDEPENDENT_AMBULATORY_CARE_PROVIDER_SITE_OTHER): Payer: Self-pay | Admitting: Cardiovascular Disease

## 2012-07-14 ENCOUNTER — Ambulatory Visit (INDEPENDENT_AMBULATORY_CARE_PROVIDER_SITE_OTHER): Payer: No Typology Code available for payment source | Admitting: Cardiovascular Disease

## 2012-07-14 VITALS — BP 150/98 | HR 74 | Ht 76.0 in | Wt 216.0 lb

## 2012-07-14 MED ORDER — LABETALOL HCL 200 MG OR TABS
ORAL_TABLET | ORAL | Status: DC
Start: 2012-07-14 — End: 2012-09-06

## 2012-07-14 NOTE — Patient Instructions (Signed)
Take labetalol 200 mg twice daily  Lisinopril unchanged

## 2012-07-14 NOTE — Progress Notes (Signed)
Cardiovascular Clinical Evaluation Note    Primary Care Provider: No, Pcp    Referring Provider: No ref. provider found    DOB:  [birth year]    CHIEF COMPLAINT:  Hypertension, previous hypotension    HISTORY OF PRESENT ILLNESS:  Patient has noted fairly consistent blood pressures with diastolics of 85-100. His minimum blood pressure was 120/85. He is to develop a lot of lower back pain starting in the last several months. He is due to have an MRI to evaluate this.  His previous hypertension was exacerbated by chronic cervical pain that was quite severe. He underwent ablation at [facility] which has improved this substantially.    There no complaints of dizziness, lightheadedness, syncope or presyncope.  He has no chest pain or shortness of breath.    His current regimen is unusual and was targeted to avoid hypotension.    PROBLEM LIST:  Patient Active Problem List    Diagnosis Date Noted   . CERVICALGIA [723.1] 03/11/2010   . Syncope [780.2]    . HYPOTENSION  [458.9]    . HYPERTENSION  [401.9]    . 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 URI (upper respiratory infection) [465.9]        MEDICATIONS:    Current Outpatient Prescriptions   Medication Sig   . HYDROCODONE-APAP (VICODIN) 5-500 MG Oral Tab as needed   . Labetalol HCl 200 MG Oral Tab TAKE ONE TABLET ONCE DAILY   . Lisinopril 10 MG Oral Tab TAKE TWO TABLETS BY MOUTH TWO TIMES DAILY     No current facility-administered medications for this visit.       ALLERGIES:  Lidocaine      PSFH: no hospitalizations or emergency room visits. He is due for the MRI would likely physical therapy for his low back symptoms.a lidocaine patch had been used with good result but he developed an allergy to it.    REVIEW OF SYSTEMS:  See Cardiovascular HPI for pertinent positives and negatives.   RESPIRATORY:  No sputum, hemoptysis.    PHYSICAL EXAM    CONSTITUTIONAL:  This is a well-nourished male in no distress.   BP 150/98  Pulse 74  Ht 6\' 4"  (1.93 m)  Wt 216 lb (97.977 kg)   BMI 26.3 kg/m2  SpO2 99%  NECK:  Jugular venous pressure normal.  Thyroid is not enlarged.  RESPIRATORY:   Clear to percussion and auscultation.  CARDIOVASCULAR:  regular rhythm.  S1 and S2 normal. No S3, S4, no murmur.  Carotid upstrokes are brisk without bruits. GASTROINTESTINAL: Abdomen without masses or tenderness. No hepatosplenomegaly.  EXTREMITIES:  No clubbing, cyanosis, edema or tenderness noted.      IMPRESSION :   1. Hypertension, with consistent blood pressures above the desirable range  2. Previous hypotension  3. Chronic no musculoskeletal pain    PLAN:    Increase labetalol to 200 mg b.i.d. He is to call if there are any symptoms suggestive of recurrent hypotension.    Electronically signed by Olene Craven, MD    07/14/2012  5:56 PM    CC:   No, Pcp  No ref. provider found

## 2012-08-11 ENCOUNTER — Ambulatory Visit (INDEPENDENT_AMBULATORY_CARE_PROVIDER_SITE_OTHER): Payer: PRIVATE HEALTH INSURANCE | Admitting: Family Medicine

## 2012-08-11 ENCOUNTER — Ambulatory Visit: Payer: PRIVATE HEALTH INSURANCE

## 2012-08-11 VITALS — BP 128/77 | HR 93 | Temp 97.9°F | Resp 16 | Ht 73.0 in | Wt 207.0 lb

## 2012-08-11 DIAGNOSIS — R0602 Shortness of breath: Secondary | ICD-10-CM

## 2012-08-11 DIAGNOSIS — R05 Cough: Secondary | ICD-10-CM

## 2012-08-11 DIAGNOSIS — F419 Anxiety disorder, unspecified: Secondary | ICD-10-CM

## 2012-08-11 DIAGNOSIS — J209 Acute bronchitis, unspecified: Secondary | ICD-10-CM

## 2012-08-11 LAB — POCT CBC
Granulocyte percent: 54.5 %G (ref 37–80)
Hemoglobin: 17.6 g/dL (ref 14.1–18.1)
MID (cbc): 0.8 (ref 0–0.9)
MPV: 9.6 fL (ref 0–99.8)
POC MID %: 11.3 %M (ref 0–12)
Platelet Count, POC: 295 10*3/uL (ref 142–424)
RBC: 5.93 M/uL (ref 4.69–6.13)
WBC: 7.1 10*3/uL (ref 4.6–10.2)

## 2012-08-11 MED ORDER — LEVOFLOXACIN 750 MG PO TABS: 750.0000 mg | ORAL_TABLET | Freq: Every day | ORAL | Status: DC

## 2012-08-11 NOTE — Progress Notes (Signed)
[address]   [place], [place]  [address]   [phone]  Subjective:    Patient ID: Joshua Clark, male    DOB: Aug 16, 1950, 62 y.o.   MRN: [id number]  HPIThis 62 y.o. male presents for evaluation of cough, shortness of breath.  Onset five days ago.  Onset of scratchy throat.  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  Awoke yesterday with horrible congestion, unable to breath, yellow phlegm. Missed work and stayed in bed. Hoarseness.  Mucinex DM without improvement.  No appetite.  No fever; no chills/sweats.  +body achiness; stiff all over.  No headache; no ear pain; prickly sharp pain in throat.  +rhinorrhea; mild nasal congestion; +coughing a lot.  +SOB; cannot take deep breaths lower congestion; no v/d.  +tobacco. No flu vaccine.    2. Stress:  Major work related stressors; previously treated for anxiety in past.  Continues to struggle with stressors at work. Not currently being treated for anxiety.  Coping well at this time.  Review of Systems  Constitutional: Positive for fatigue. Negative for fever, chills and diaphoresis.  HENT: Positive for congestion, sore throat, rhinorrhea and postnasal drip. Negative for ear pain and sinus pressure.   Respiratory: Positive for cough and shortness of breath. Negative for wheezing.   Gastrointestinal: Negative for nausea, vomiting and diarrhea.  Skin: Negative for rash.        Past Medical History  Diagnosis Date  . Depression   . GERD (gastroesophageal reflux disease)   . Anxiety     Past Surgical History  Procedure Date  . Fracture surgery     Prior to Admission medications   Not on File    No Known Allergies  History   Social History  . Marital Status: Divorced    Spouse Name: N/A    Number of Children: N/A  . Years of Education: N/A   Occupational History  . Not on file.   Social History Main Topics  . Smoking status: Current Every Day Smoker -- 0.5 packs/day for 5 years  . Smokeless tobacco: Not on file  . Alcohol Use:  Yes  . Drug Use: No  . Sexually Active: No   Other Topics Concern  . Not on file   Social History Narrative  . No narrative on file    Family History  Problem Relation Age of Onset  . Hypertension Mother     Objective:   Physical Exam  Nursing note and vitals reviewed. Constitutional: He is oriented to person, place, and time. He appears well-developed and well-nourished.  HENT:  Head: Normocephalic and atraumatic.  Right Ear: External ear normal.  Left Ear: External ear normal.  Nose: Nose normal.  Mouth/Throat: Posterior oropharyngeal erythema present.  Eyes: Conjunctivae normal are normal. Pupils are equal, round, and reactive to light.  Neck: Normal range of motion. Neck supple.  Cardiovascular: Normal rate, regular rhythm and normal heart sounds.   No murmur heard. Pulmonary/Chest: Effort normal and breath sounds normal. He has no wheezes. He has no rales.  Lymphadenopathy:    He has no cervical adenopathy.  Neurological: He is alert and oriented to person, place, and time.  Skin: No rash noted.  Psychiatric: He has a normal mood and affect. His behavior is normal.   Results for orders placed in visit on 08/11/12  POCT INFLUENZA A/B      Component Value Range   Influenza A, POC Negative     Influenza B, POC Negative  POCT CBC      Component Value Range   WBC 7.1  4.6 - 10.2 K/uL   Lymph, poc 2.4  0.6 - 3.4   POC LYMPH PERCENT 34.2  10 - 50 %L   MID (cbc) 0.8  0 - 0.9   POC MID % 11.3  0 - 12 %M   POC Granulocyte 3.9  2 - 6.9   Granulocyte percent 54.5  37 - 80 %G   RBC 5.93  4.69 - 6.13 M/uL   Hemoglobin 17.6  14.1 - 18.1 g/dL   HCT, POC 29.5 (*) 28.4 - 53.7 %   MCV 92.4  80 - 97 fL   MCH, POC 29.7  27 - 31.2 pg   MCHC 32.1  31.8 - 35.4 g/dL   RDW, POC 13.2     Platelet Count, POC 295  142 - 424 K/uL   MPV 9.6  0 - 99.8 fL    [facility] reading (PRIMARY) by  Dr. Katrinka Blazing.  CXR: NAD.      Assessment & Plan:   1. Cough  POCT Influenza A/B, POCT CBC, DG  Chest 2 View  2. SOB (shortness of breath)  POCT Influenza A/B, POCT CBC, DG Chest 2 View  3. Anxiety    4.  Acute Bronchitis    1.  Acute Bronchitis:  New.  Rx for Levaquin 750mg  daily x 5-7 days. Recommend Mucinex DM bid.  RTC for acute worsening.  2.  Anxiety:  Persistent issue; recommend exercise, regular sleep. If worsens, RTC to discuss psychotherapy and/or medication.  Meds ordered this encounter  Medications  . levofloxacin (LEVAQUIN) 750 MG tablet    Sig: Take 1 tablet (750 mg total) by mouth daily.    Dispense:  7 tablet    Refill:  0

## 2012-08-11 NOTE — Patient Instructions (Addendum)
1. Cough  POCT Influenza A/B, POCT CBC, DG Chest 2 View  2. SOB (shortness of breath)  POCT Influenza A/B, POCT CBC, DG Chest 2 View  3. Anxiety

## 2012-08-19 ENCOUNTER — Ambulatory Visit (HOSPITAL_BASED_OUTPATIENT_CLINIC_OR_DEPARTMENT_OTHER): Payer: No Typology Code available for payment source | Attending: Anesthesiology

## 2012-08-19 ENCOUNTER — Other Ambulatory Visit (HOSPITAL_BASED_OUTPATIENT_CLINIC_OR_DEPARTMENT_OTHER): Payer: Self-pay | Admitting: Anesthesiology

## 2012-08-19 DIAGNOSIS — M51379 Other intervertebral disc degeneration, lumbosacral region without mention of lumbar back pain or lower extremity pain: Secondary | ICD-10-CM | POA: Insufficient documentation

## 2012-08-19 DIAGNOSIS — M545 Low back pain, unspecified: Secondary | ICD-10-CM | POA: Insufficient documentation

## 2012-08-19 LAB — PR MRI SPINAL CANAL LUMBAR W/O CONTRAST MATERIAL

## 2012-08-26 ENCOUNTER — Ambulatory Visit (HOSPITAL_BASED_OUTPATIENT_CLINIC_OR_DEPARTMENT_OTHER): Payer: No Typology Code available for payment source | Attending: Anesthesiology | Admitting: Anesthesiology

## 2012-08-26 DIAGNOSIS — M545 Low back pain, unspecified: Secondary | ICD-10-CM | POA: Insufficient documentation

## 2012-08-26 DIAGNOSIS — M542 Cervicalgia: Secondary | ICD-10-CM | POA: Insufficient documentation

## 2012-08-26 DIAGNOSIS — G8929 Other chronic pain: Secondary | ICD-10-CM | POA: Insufficient documentation

## 2012-08-31 ENCOUNTER — Ambulatory Visit (HOSPITAL_BASED_OUTPATIENT_CLINIC_OR_DEPARTMENT_OTHER): Payer: No Typology Code available for payment source | Attending: Anesthesiology | Admitting: Anesthesiology

## 2012-08-31 DIAGNOSIS — M545 Low back pain, unspecified: Secondary | ICD-10-CM | POA: Insufficient documentation

## 2012-09-06 ENCOUNTER — Other Ambulatory Visit (INDEPENDENT_AMBULATORY_CARE_PROVIDER_SITE_OTHER): Payer: Self-pay | Admitting: Cardiovascular Disease

## 2012-09-06 MED ORDER — LABETALOL HCL 200 MG OR TABS
ORAL_TABLET | ORAL | Status: DC
Start: 2012-09-06 — End: 2013-02-23

## 2012-09-06 NOTE — Telephone Encounter (Signed)
Last seen by Dr. Aundria Rud on 07/14/2012    Doctor recommends follow up in 6 months(recall) from the date above    Per note Labetolol increased to 200 mg twice daily. Sig was not updated, Pharmacy is requesting updated Rx.

## 2012-09-26 ENCOUNTER — Encounter (INDEPENDENT_AMBULATORY_CARE_PROVIDER_SITE_OTHER): Payer: Self-pay | Admitting: Internal Medicine

## 2012-09-26 DIAGNOSIS — E785 Hyperlipidemia, unspecified: Secondary | ICD-10-CM

## 2012-09-26 HISTORY — DX: Hyperlipidemia, unspecified: E78.5

## 2012-09-27 ENCOUNTER — Encounter (INDEPENDENT_AMBULATORY_CARE_PROVIDER_SITE_OTHER): Payer: Self-pay | Admitting: Internal Medicine

## 2012-09-30 ENCOUNTER — Ambulatory Visit (HOSPITAL_BASED_OUTPATIENT_CLINIC_OR_DEPARTMENT_OTHER): Payer: No Typology Code available for payment source | Attending: Anesthesiology | Admitting: Anesthesiology

## 2012-09-30 DIAGNOSIS — M545 Low back pain, unspecified: Secondary | ICD-10-CM | POA: Insufficient documentation

## 2012-09-30 DIAGNOSIS — M542 Cervicalgia: Secondary | ICD-10-CM | POA: Insufficient documentation

## 2012-09-30 DIAGNOSIS — G8929 Other chronic pain: Secondary | ICD-10-CM | POA: Insufficient documentation

## 2012-10-28 ENCOUNTER — Ambulatory Visit: Payer: PRIVATE HEALTH INSURANCE

## 2012-10-28 ENCOUNTER — Ambulatory Visit (INDEPENDENT_AMBULATORY_CARE_PROVIDER_SITE_OTHER): Payer: PRIVATE HEALTH INSURANCE | Admitting: Internal Medicine

## 2012-10-28 ENCOUNTER — Other Ambulatory Visit: Payer: Self-pay | Admitting: *Deleted

## 2012-10-28 VITALS — BP 116/70 | HR 73 | Temp 98.2°F | Resp 12 | Ht 72.0 in | Wt 201.0 lb

## 2012-10-28 DIAGNOSIS — F39 Unspecified mood [affective] disorder: Secondary | ICD-10-CM

## 2012-10-28 DIAGNOSIS — F17200 Nicotine dependence, unspecified, uncomplicated: Secondary | ICD-10-CM

## 2012-10-28 DIAGNOSIS — Z0000 Encounter for general adult medical examination without abnormal findings: Secondary | ICD-10-CM

## 2012-10-28 DIAGNOSIS — R319 Hematuria, unspecified: Secondary | ICD-10-CM

## 2012-10-28 DIAGNOSIS — E538 Deficiency of other specified B group vitamins: Secondary | ICD-10-CM

## 2012-10-28 DIAGNOSIS — R413 Other amnesia: Secondary | ICD-10-CM

## 2012-10-28 LAB — LIPID PANEL
HDL: 46 mg/dL (ref >39)
LDL Cholesterol: 126 mg/dL — ABNORMAL HIGH (ref 0–99)
Total CHOL/HDL Ratio: 4 Ratio
VLDL: 13 mg/dL (ref 0–40)

## 2012-10-28 LAB — POCT CBC
HCT, POC: 50.4 % (ref 43.5–53.7)
Lymph, poc: 2.3 (ref 0.6–3.4)
MCH, POC: 29.9 pg (ref 27–31.2)
MCHC: 32.5 g/dL (ref 31.8–35.4)
MCV: 92 fL (ref 80–97)
MID (cbc): 0.8 (ref 0–0.9)
POC LYMPH PERCENT: 29.6 %L (ref 10–50)
Platelet Count, POC: 288 10*3/uL (ref 142–424)
RDW, POC: 14.2 %
WBC: 7.8 10*3/uL (ref 4.6–10.2)

## 2012-10-28 LAB — POCT URINALYSIS DIPSTICK
Bilirubin, UA: NEGATIVE
Nitrite, UA: NEGATIVE
Protein, UA: NEGATIVE
pH, UA: 5.5

## 2012-10-28 LAB — POCT UA - MICROSCOPIC ONLY
Crystals, Ur, HPF, POC: NEGATIVE
Yeast, UA: NEGATIVE

## 2012-10-28 LAB — PSA: PSA: 2.02 ng/mL (ref <=4.00)

## 2012-10-28 LAB — PULMONARY FUNCTION TEST

## 2012-10-28 LAB — COMPREHENSIVE METABOLIC PANEL
AST: 15 U/L (ref 0–37)
BUN: 15 mg/dL (ref 6–23)
CO2: 26 mEq/L (ref 19–32)
Calcium: 9.4 mg/dL (ref 8.4–10.5)
Chloride: 104 mEq/L (ref 96–112)
Creat: 1.12 mg/dL (ref 0.50–1.35)
Total Bilirubin: 0.4 mg/dL (ref 0.3–1.2)

## 2012-10-28 MED ORDER — CIPROFLOXACIN HCL 500 MG PO TABS: 500.0000 mg | ORAL_TABLET | Freq: Two times a day (BID) | ORAL | Status: DC

## 2012-10-28 MED ORDER — SERTRALINE HCL 50 MG PO TABS: 50.0000 mg | ORAL_TABLET | Freq: Every day | ORAL | Status: DC

## 2012-10-28 NOTE — Patient Instructions (Addendum)
Nicotine Addiction Nicotine can act as both a stimulant (excites/activates) and a sedative (calms/quiets). Immediately after exposure to nicotine, there is a "kick" caused in part by the drug's stimulation of the adrenal glands and resulting discharge of adrenaline (epinephrine). The rush of adrenaline stimulates the body and causes a sudden release of sugar. This means that smokers are always slightly hyperglycemic. Hyperglycemic means that the blood sugar is high, just like in diabetics. Nicotine also decreases the amount of insulin which helps control sugar levels in the body. There is an increase in blood pressure, breathing, and the rate of heart beats.  In addition, nicotine indirectly causes a release of dopamine in the brain that controls pleasure and motivation. A similar reaction is seen with other drugs of abuse, such as cocaine and heroin. This dopamine release is thought to cause the pleasurable sensations when smoking. In some different cases, nicotine can also create a calming effect, depending on sensitivity of the smoker's nervous system and the dose of nicotine taken. WHAT HAPPENS WHEN NICOTINE IS TAKEN FOR LONG PERIODS OF TIME?  Long-term use of nicotine results in addiction. It is difficult to stop.  Repeated use of nicotine creates tolerance. Higher doses of nicotine are needed to get the "kick." When nicotine use is stopped, withdrawal may last a month or more. Withdrawal may begin within a few hours after the last cigarette. Symptoms peak within the first few days and may lessen within a few weeks. For some people, however, symptoms may last for months or longer. Withdrawal symptoms include:   Irritability.  Craving.  Learning and attention deficits.  Sleep disturbances.  Increased appetite. Craving for tobacco may last for 6 months or longer. Many behaviors done while using nicotine can also play a part in the severity of withdrawal symptoms. For some people, the feel,  smell, and sight of a cigarette and the ritual of obtaining, handling, lighting, and smoking the cigarette are closely linked with the pleasure of smoking. When stopped, they also miss the related behaviors which make the withdrawal or craving worse. While nicotine gum and patches may lessen the drug aspects of withdrawal, cravings often persist. WHAT ARE THE MEDICAL CONSEQUENCES OF NICOTINE USE?  Nicotine addiction accounts for one-third of all cancers. The top cancer caused by tobacco is lung cancer. Lung cancer is the number one cancer killer of both men and women.  Smoking is also associated with cancers of the:  Mouth.  Pharynx.  Larynx.  Esophagus.  Stomach.  Pancreas.  Cervix.  Kidney.  Ureter.  Bladder.  Smoking also causes lung diseases such as lasting (chronic) bronchitis and emphysema.  It worsens asthma in adults and children.  Smoking increases the risk of heart disease, including:  Stroke.  Heart attack.  Vascular disease.  Aneurysm.  Passive or secondary smoke can also increase medical risks including:  Asthma in children.  Sudden Infant Death Syndrome (SIDS).  Additionally, dropped cigarettes are the leading cause of residential fire fatalities.  Nicotine poisoning has been reported from accidental ingestion of tobacco products by children and pets. Death usually results in a few minutes from respiratory failure (when a person stops breathing) caused by paralysis. TREATMENT   Medication. Nicotine replacement medicines such as nicotine gum and the patch are used to stop smoking. These medicines gradually lower the dosage of nicotine in the body. These medicines do not contain the carbon monoxide and other toxins found in tobacco smoke.  Hypnotherapy.  Relaxation therapy.  Nicotine Anonymous (a 12-step support   program). Find times and locations in your local yellow pages. Document Released: 03/25/2004 Document Revised: 10/12/2011 Document  Reviewed: 08/17/2007 [facility] Patient Information 2013 [place], [place]. Mood Disorders Mood disorders are conditions that affect the way a person feels emotionally. The main mood disorders include:  Depression.  Bipolar disorder.  Dysthymia. Dysthymia is a mild, lasting (chronic) depression. Symptoms of dysthymia are similar to depression, but not as severe.  Cyclothymia. Cyclothymia includes mood swings, but the highs and lows are not as severe as they are in bipolar disorder. Symptoms of cyclothymia are similar to those of bipolar disorder, but less extreme. CAUSES  Mood disorders are probably caused by a combination of factors. People with mood disorders seem to have physical and chemical changes in their brains. Mood disorders run in families, so there may be genetic causes. Severe trauma or stressful life events may also increase the risk of mood disorders.  SYMPTOMS  Symptoms of mood disorders depend on the specific type of condition. Depression symptoms include:  Feeling sad, worthless, or hopeless.  Negative thoughts.  Inability to enjoy one's usual activities.  Low energy.  Sleeping too much or too little.  Appetite changes.  Crying.  Concentration problems.  Thoughts of harming oneself. Bipolar disorder symptoms include:  Periods of depression (see above symptoms).  Mood swings, from sadness and depression, to abnormal elation and excitement.  Periods of mania:  Racing thoughts.  Fast speech.  Poor judgment, and careless, dangerous choices.  Decreased need for sleep.  Risky behavior.  Difficulty concentrating.  Irritability.  Increased energy.  Increased sex drive. DIAGNOSIS  There are no blood tests or X-rays that can confirm a mood disorder. However, your caregiver may choose to run some tests to make sure that there is not another physical cause for your symptoms. A mood disorder is usually diagnosed after an in-depth interview with a  caregiver. TREATMENT  Mood disorders can be treated with one or more of the following:  Medicine. This may include antidepressants, mood-stabilizers, or anti-psychotics.  Psychotherapy (talk therapy).  Cognitive behavioral therapy. You are taught to recognize negative thoughts and behavior patterns, and replace them with healthy thoughts and behaviors.  Electroconvulsive therapy. For very severe cases of deep depression, a series of treatments in which an electrical current is applied to the brain.  Vagus nerve stimulation. A pulse of electricity is applied to a portion of the brain.  Transcranial magnetic stimulation. Powerful magnets are placed on the head that produce electrical currents.  Hospitalization. In severe situations, or when someone is having serious thoughts of harming him or herself, hospitalization may be necessary in order to keep the person safe. This is also done to quickly start and monitor treatment. HOME CARE INSTRUCTIONS   Take your medicine exactly as directed.  Attend all of your therapy sessions.  Try to eat regular, healthy meals.  Exercise daily. Exercise may improve mood symptoms.  Get good sleep.  Do not drink alcohol or use pot or other drugs. These can worsen mood symptoms and cause anxiety and psychosis.  Tell your caregiver if you develop any side effects, such as feeling sick to your stomach (nauseous), dry mouth, dizziness, constipation, drowsiness, tremor, weight gain, or sexual symptoms. He or she may suggest things you can do to improve symptoms.  Learn ways to cope with the stress of having a chronic illness. This includes yoga, meditation, tai chi, or participating in a support group.  Drink enough water to keep your urine clear or pale yellow.  Eat a high-fiber diet. These habits may help you avoid constipation from your medicine. SEEK IMMEDIATE MEDICAL CARE IF:  Your mood worsens.  You have thoughts of hurting yourself or  others.  You cannot care for yourself.  You develop the sensation of hearing or seeing something that is not actually present (auditory or visual hallucinations).  You develop abnormal thoughts. Document Released: 05/17/2009 Document Revised: 10/12/2011 Document Reviewed: 05/17/2009 [facility] Patient Information 2013 [place], [place]. Hematuria, Adult Hematuria (blood in your urine) can be caused by a bladder infection (cystitis), kidney infection (pyelonephritis), prostate infection (prostatitis), or kidney stone. Infections will usually respond to antibiotics (medications which kill germs), and a kidney stone will usually pass through your urine without further treatment. If you were put on antibiotics, take all the medicine until gone. You may feel better in a few days, but take all of your medicine or the infection may not respond and become more difficult to treat. If antibiotics were not given, an infection did not cause the blood in the urine. A further work up to find out the reason may be needed. HOME CARE INSTRUCTIONS   Drink lots of fluid, 3 to 4 quarts a day. If you have been diagnosed with an infection, cranberry juice is especially recommended, in addition to large amounts of water.  Avoid caffeine, tea, and carbonated beverages, because they tend to irritate the bladder.  Avoid alcohol as it may irritate the prostate.  Only take over-the-counter or prescription medicines for pain, discomfort, or fever as directed by your caregiver.  If you have been diagnosed with a kidney stone follow your caregivers instructions regarding straining your urine to catch the stone. TO PREVENT FURTHER INFECTIONS:  Empty the bladder often. Avoid holding urine for long periods of time.  After a bowel movement, women should cleanse front to back. Use each tissue only once.  Empty the bladder before and after sexual intercourse if you are a male.  Return to your caregiver if you develop back  pain, fever, nausea (feeling sick to your stomach), vomiting, or your symptoms (problems) are not better in 3 days. Return sooner if you are getting worse. If you have been requested to return for further testing make sure to keep your appointments. If an infection is not the cause of blood in your urine, X-rays may be required. Your caregiver will discuss this with you. SEEK IMMEDIATE MEDICAL CARE IF:   You have a persistent fever over 102 F (38.9 C).  You develop severe vomiting and are unable to keep the medication down.  You develop severe back or abdominal pain despite taking your medications.  You begin passing a large amount of blood or clots in your urine.  You feel extremely weak or faint, or pass out. MAKE SURE YOU:   Understand these instructions.  Will watch your condition.  Will get help right away if you are not doing well or get worse. Document Released: 07/20/2005 Document Revised: 10/12/2011 Document Reviewed: 03/08/2008 [facility] Patient Information 2013 [place], [place]. Insomnia Insomnia is frequent trouble falling and/or staying asleep. Insomnia can be a long term problem or a short term problem. Both are common. Insomnia can be a short term problem when the wakefulness is related to a certain stress or worry. Long term insomnia is often related to ongoing stress during waking hours and/or poor sleeping habits. Overtime, sleep deprivation itself can make the problem worse. Every little thing feels more severe because you are overtired and your ability to cope  is decreased. CAUSES   Stress, anxiety, and depression.  Poor sleeping habits.  Distractions such as TV in the bedroom.  Naps close to bedtime.  Engaging in emotionally charged conversations before bed.  Technical reading before sleep.  Alcohol and other sedatives. They may make the problem worse. They can hurt normal sleep patterns and normal dream activity.  Stimulants such as caffeine for several  hours prior to bedtime.  Pain syndromes and shortness of breath can cause insomnia.  Exercise late at night.  Changing time zones may cause sleeping problems (jet lag). It is sometimes helpful to have someone observe your sleeping patterns. They should look for periods of not breathing during the night (sleep apnea). They should also look to see how long those periods last. If you live alone or observers are uncertain, you can also be observed at a sleep clinic where your sleep patterns will be professionally monitored. Sleep apnea requires a checkup and treatment. Give your caregivers your medical history. Give your caregivers observations your family has made about your sleep.  SYMPTOMS   Not feeling rested in the morning.  Anxiety and restlessness at bedtime.  Difficulty falling and staying asleep. TREATMENT   Your caregiver may prescribe treatment for an underlying medical disorders. Your caregiver can give advice or help if you are using alcohol or other drugs for self-medication. Treatment of underlying problems will usually eliminate insomnia problems.  Medications can be prescribed for short time use. They are generally not recommended for lengthy use.  Over-the-counter sleep medicines are not recommended for lengthy use. They can be habit forming.  You can promote easier sleeping by making lifestyle changes such as:  Using relaxation techniques that help with breathing and reduce muscle tension.  Exercising earlier in the day.  Changing your diet and the time of your last meal. No night time snacks.  Establish a regular time to go to bed.  Counseling can help with stressful problems and worry.  Soothing music and white noise may be helpful if there are background noises you cannot remove.  Stop tedious detailed work at least one hour before bedtime. HOME CARE INSTRUCTIONS   Keep a diary. Inform your caregiver about your progress. This includes any medication side  effects. See your caregiver regularly. Take note of:  Times when you are asleep.  Times when you are awake during the night.  The quality of your sleep.  How you feel the next day. This information will help your caregiver care for you.  Get out of bed if you are still awake after 15 minutes. Read or do some quiet activity. Keep the lights down. Wait until you feel sleepy and go back to bed.  Keep regular sleeping and waking hours. Avoid naps.  Exercise regularly.  Avoid distractions at bedtime. Distractions include watching television or engaging in any intense or detailed activity like attempting to balance the household checkbook.  Develop a bedtime ritual. Keep a familiar routine of bathing, brushing your teeth, climbing into bed at the same time each night, listening to soothing music. Routines increase the success of falling to sleep faster.  Use relaxation techniques. This can be using breathing and muscle tension release routines. It can also include visualizing peaceful scenes. You can also help control troubling or intruding thoughts by keeping your mind occupied with boring or repetitive thoughts like the old concept of counting sheep. You can make it more creative like imagining planting one beautiful flower after another in your backyard garden.  During your day, work to eliminate stress. When this is not possible use some of the previous suggestions to help reduce the anxiety that accompanies stressful situations. MAKE SURE YOU:   Understand these instructions.  Will watch your condition.  Will get help right away if you are not doing well or get worse. Document Released: 07/17/2000 Document Revised: 10/12/2011 Document Reviewed: 08/17/2007 [facility] Patient Information 2013 [place], [place].

## 2012-10-28 NOTE — Telephone Encounter (Signed)
Error

## 2012-10-28 NOTE — Progress Notes (Signed)
Subjective:    Patient ID: Joshua Clark, male    DOB: [birth year], 62 y.o.   MRN: [id number]  HPI Colonoscopy with Dr. Leone Payor. Hx depression in past and now, just fired from job. No overt signs of depression, discussed and has used paxil and effexor in the past. Father Greyson Peavy died MI 46yo, Mom alive 31 some memory issues.   Review of Systems  Constitutional: Positive for activity change, appetite change and fatigue.  HENT: Positive for tinnitus.   Respiratory: Positive for apnea and wheezing.   Gastrointestinal: Positive for abdominal distention.  Genitourinary: Positive for difficulty urinating.  Neurological: Positive for dizziness, speech difficulty and light-headedness.  Psychiatric/Behavioral: Positive for confusion, sleep disturbance, decreased concentration and agitation.       Objective:   Physical Exam  Vitals reviewed. Constitutional: He is oriented to person, place, and time. He appears well-developed and well-nourished.  HENT:  Right Ear: External ear normal.  Left Ear: External ear normal.  Nose: Mucosal edema present.  Mouth/Throat: No oral lesions. Normal dentition. Posterior oropharyngeal erythema present.  Eyes: Conjunctivae and EOM are normal. Pupils are equal, round, and reactive to light. No scleral icterus.  Fundoscopic nl  Neck: Neck supple. No tracheal deviation present. No thyromegaly present.  Cardiovascular: Normal rate, regular rhythm, normal heart sounds and intact distal pulses.   No murmur heard. Pulmonary/Chest: Effort normal and breath sounds normal. He has no wheezes. He exhibits no tenderness.  Abdominal: Soft. Bowel sounds are normal. He exhibits no distension and no mass. There is no tenderness.  Genitourinary: Rectum normal, prostate normal and penis normal. Guaiac negative stool.  Musculoskeletal: Normal range of motion.  Lymphadenopathy:    He has no cervical adenopathy.  Neurological: He is alert and oriented to person, place, and  time. He has normal reflexes. No cranial nerve deficit. He exhibits normal muscle tone. Coordination normal.  Skin: Skin is warm and dry. No rash noted.     Results for orders placed in visit on 10/28/12  POCT CBC      Result Value Range   WBC 7.8  4.6 - 10.2 K/uL   Lymph, poc 2.3  0.6 - 3.4   POC LYMPH PERCENT 29.6  10 - 50 %L   MID (cbc) 0.8  0 - 0.9   POC MID % 9.7  0 - 12 %M   POC Granulocyte 4.7  2 - 6.9   Granulocyte percent 60.7  37 - 80 %G   RBC 5.48  4.69 - 6.13 M/uL   Hemoglobin 16.4  14.1 - 18.1 g/dL   HCT, POC 96.2  95.2 - 53.7 %   MCV 92.0  80 - 97 fL   MCH, POC 29.9  27 - 31.2 pg   MCHC 32.5  31.8 - 35.4 g/dL   RDW, POC 84.1     Platelet Count, POC 288  142 - 424 K/uL   MPV 9.9  0 - 99.8 fL  POCT UA - MICROSCOPIC ONLY      Result Value Range   WBC, Ur, HPF, POC 0-1     RBC, urine, microscopic 2-9     Bacteria, U Microscopic trace     Mucus, UA moderate     Epithelial cells, urine per micros 0-1     Crystals, Ur, HPF, POC neg     Casts, Ur, LPF, POC RBC Cast     Yeast, UA neg    POCT URINALYSIS DIPSTICK      Result Value Range  Color, UA yellow     Clarity, UA clear     Glucose, UA neg     Bilirubin, UA neg     Ketones, UA trace     Spec Grav, UA 1.025     Blood, UA trace-lysed     pH, UA 5.5     Protein, UA neg     Urobilinogen, UA 0.2     Nitrite, UA neg     Leukocytes, UA Negative    IFOBT (OCCULT BLOOD)      Result Value Range   IFOBT Negative     [facility] reading (PRIMARY) by  Dr Perrin Maltese 30-24yr smoker cxr chronic changes and hyperinflation PFTsnormal EKG normal      Assessment & Plan:  Memory/Concentration issues Refer to Neurology Mood Disorder/ Possible depression Hematuria/trial cipro then repeat urine in 2-3 weeks

## 2012-11-05 ENCOUNTER — Other Ambulatory Visit (INDEPENDENT_AMBULATORY_CARE_PROVIDER_SITE_OTHER): Payer: Self-pay | Admitting: Cardiovascular Disease

## 2012-11-07 MED ORDER — LISINOPRIL 10 MG OR TABS
ORAL_TABLET | ORAL | Status: DC
Start: 2012-11-05 — End: 2013-04-20

## 2012-11-07 NOTE — Telephone Encounter (Signed)
Last seen by Dr. Aundria Rud on 07/14/2012   Doctor recommends follow up in 6 months(recall) from the date above

## 2012-11-25 ENCOUNTER — Ambulatory Visit (HOSPITAL_BASED_OUTPATIENT_CLINIC_OR_DEPARTMENT_OTHER): Payer: No Typology Code available for payment source | Attending: Anesthesiology | Admitting: Anesthesiology

## 2012-11-25 DIAGNOSIS — IMO0001 Reserved for inherently not codable concepts without codable children: Secondary | ICD-10-CM | POA: Insufficient documentation

## 2012-11-25 DIAGNOSIS — M545 Low back pain, unspecified: Secondary | ICD-10-CM | POA: Insufficient documentation

## 2012-11-25 DIAGNOSIS — M542 Cervicalgia: Secondary | ICD-10-CM | POA: Insufficient documentation

## 2012-12-06 ENCOUNTER — Encounter: Payer: Self-pay | Admitting: Internal Medicine

## 2012-12-08 ENCOUNTER — Telehealth: Payer: Self-pay

## 2012-12-08 NOTE — Telephone Encounter (Signed)
Patient is wanting to know if he needs to follow up with Dr Perrin Maltese from his visit in March.   Best#: [phone]

## 2012-12-08 NOTE — Telephone Encounter (Signed)
Was due for follow up in April, called him advised him he is past due, he states he has recently lost his job, he is without insurance. He states he is feeling like his bladder may not be emptying properly. Advised him he should come back in.

## 2012-12-21 ENCOUNTER — Other Ambulatory Visit (INDEPENDENT_AMBULATORY_CARE_PROVIDER_SITE_OTHER): Payer: Self-pay | Admitting: Internal Medicine

## 2012-12-21 ENCOUNTER — Encounter (INDEPENDENT_AMBULATORY_CARE_PROVIDER_SITE_OTHER): Payer: Self-pay | Admitting: Internal Medicine

## 2012-12-21 ENCOUNTER — Ambulatory Visit (INDEPENDENT_AMBULATORY_CARE_PROVIDER_SITE_OTHER): Payer: No Typology Code available for payment source | Admitting: Internal Medicine

## 2012-12-21 VITALS — BP 140/104 | HR 76 | Ht 76.0 in | Wt 213.2 lb

## 2012-12-21 DIAGNOSIS — E538 Deficiency of other specified B group vitamins: Secondary | ICD-10-CM

## 2012-12-21 DIAGNOSIS — N2889 Other specified disorders of kidney and ureter: Secondary | ICD-10-CM

## 2012-12-21 DIAGNOSIS — N183 Chronic kidney disease, stage 3 unspecified: Secondary | ICD-10-CM

## 2012-12-21 HISTORY — DX: Chronic kidney disease, stage 3 unspecified: N18.30

## 2012-12-21 HISTORY — DX: Other specified disorders of kidney and ureter: N28.89

## 2012-12-21 HISTORY — DX: Deficiency of other specified B group vitamins: E53.8

## 2012-12-21 LAB — LIPID PANEL
Cholesterol (LDL): 149 mg/dL — ABNORMAL HIGH (ref <130)
Cholesterol/HDL Ratio: 2.3
HDL Cholesterol: 132 mg/dL (ref >40)
Non-HDL Cholesterol: 176 mg/dL — ABNORMAL HIGH (ref 0–159)
Total Cholesterol: 308 mg/dL — ABNORMAL HIGH (ref <200)
Triglyceride: 135 mg/dL (ref <150)

## 2012-12-21 LAB — COMPREHENSIVE METABOLIC PANEL
ALT (GPT): 29 U/L (ref 10–48)
AST (GOT): 30 U/L (ref 15–40)
Albumin: 4.7 g/dL (ref 3.5–5.2)
Alkaline Phosphatase (Total): 65 U/L (ref 37–159)
Anion Gap: 12 — ABNORMAL HIGH (ref 3–11)
Bilirubin (Total): 1.1 mg/dL (ref 0.2–1.3)
Calcium: 10.1 mg/dL (ref 8.9–10.2)
Carbon Dioxide, Total: 28 mEq/L (ref 22–32)
Chloride: 99 mEq/L (ref 98–108)
Creatinine: 1.38 mg/dL — ABNORMAL HIGH (ref 0.51–1.18)
GFR, Calc, African American: >60 mL/min (ref >59)
GFR, Calc, European American: 53 mL/min — ABNORMAL LOW (ref >59)
Glucose: 110 mg/dL (ref 62–125)
Potassium: 4.5 mEq/L (ref 3.7–5.2)
Protein (Total): 7.2 g/dL (ref 6.0–8.2)
Sodium: 139 mEq/L (ref 136–145)
Urea Nitrogen: 31 mg/dL — ABNORMAL HIGH (ref 8–21)

## 2012-12-21 LAB — THYROID STIMULATING HORMONE: Thyroid Stimulating Hormone: 0.894 u[IU]/mL (ref 0.400–5.000)

## 2012-12-21 LAB — VITAMIN B12 (COBALAMIN)
Vitamin B12 (Cobalamin): 197 pg/mL (ref 180–914)
Vitamin B12 (Cobalamin): NORMAL

## 2012-12-21 LAB — T4, FREE: Thyroxine (Free): 0.9 ng/dL (ref 0.6–1.2)

## 2012-12-21 MED ORDER — EPINEPHRINE 0.3 MG/0.3ML IJ SOAJ
INTRAMUSCULAR | Status: DC
Start: 2012-12-21 — End: 2014-02-20

## 2012-12-21 MED ORDER — CYANOCOBALAMIN 1000 MCG OR TABS
ORAL_TABLET | ORAL | Status: DC
Start: 2012-12-21 — End: 2023-02-01

## 2012-12-21 MED ORDER — NEOMYCIN-POLYMYXIN-HC 3.5-10000-1 OT SOLN
OTIC | Status: DC
Start: 2012-12-21 — End: 2013-06-02

## 2012-12-21 NOTE — Patient Instructions (Addendum)
Labs by [facility]    If your ears are still bothering you at the end of the treatment please see    Otherwise I will see him back in followup yearly-unless of course neurology wants something different

## 2012-12-21 NOTE — Progress Notes (Signed)
Your total cholesterol is really high but only because you have amazingly good HDL    I know you'll be seeing Dr. Aundria Rud in followup-so you can talk to him about this  I not too excited about putting you on a pill    Surprisingly the b12-is low  In your case I would go ahead and just take 1000 mcg  use of B12-over-the-counter     I would recheck this level  Maybe in about 6-8 weeks    You could discuss it further with neurologist  I don't think is the cause for your tremors    Thyroid was fine    The creatinine indicates maybe a little bit of low kidney function test-renal insufficiency stage III    And we should follow this  Maybe repeated in about 4-6 months    Office Visit on 12/21/12   COMPREHENSIVE METABOLIC PANEL       Result Value Range    Sodium 139  136 - 145 mEq/L    Potassium 4.5  3.7 - 5.2 mEq/L    Chloride 99  98 - 108 mEq/L    Carbon Dioxide, Total 28  22 - 32 mEq/L    Anion Gap 12 (*) 3 - 11    Glucose 110  62 - 125 mg/dL    Urea Nitrogen 31 (*) 8 - 21 mg/dL    Creatinine 1.61 (*) 0.51 - 1.18 mg/dL    Protein (Total) 7.2  6.0 - 8.2 g/dL    Albumin 4.7  3.5 - 5.2 g/dL    Bilirubin (Total) 1.1  0.2 - 1.3 mg/dL    Calcium 09.6  8.9 - 10.2 mg/dL    AST (GOT) 30  15 - 40 U/L    Alkaline Phosphatase (Total) 65  37 - 159 U/L    ALT (GPT) 29  10 - 48 U/L    Gfr, Calc, European American 53 (*) >59 mL/min    GFR, Calc, African American >60  >59 mL/min    GFR, Information        Value: Calculated GFR in mL/min/1.73 m2 by MDRD equation.      Inaccurate with changing renal function.  See      [email or web address]://[email or web address]   LIPID PANEL       Result Value Range    Cholesterol (Total) 308 (*) <200 mg/dL    Triglyceride 045  <409 mg/dL    Cholesterol (HDL) 811  >40 mg/dL    Cholesterol (LDL) 914 (*) <130 mg/dL    Non-HDL Cholesterol 176 (*) 0 - 159 mg/dL    Cholesterol/ HDL Ratio 2.3      Lipid Panel, Additional Info.        Value:       (NOTE)      For complete information to help  interpret the lipid       panel, please       use the National Cholesterol Education Program (NCEP)       guideline based       reference range comments found here:      http://web.labmed.TelephoneAid.tn   VITAMIN B12 (COBALAMIN)       Result Value Range    Vitamin B12 (Cobalamin) 197  180 - 914 pg/mL    Vitamin B12 (Cobalamin)        Normal:  180-914 pg/mL      Vitamin B12 (Cobalamin) Indeterminate:  145-179 pg/mL      Vitamin B12 (Cobalamin)  Deficient:  <145 pg/mL     THYROID STIMULATING HORMONE       Result Value Range    Thyroid Stimulating Hormone 0.894  0.400 - 5.000 uIU/mL   T4, FREE       Result Value Range    Thyroxine (Free) 0.9  0.6 - 1.2 ng/dL

## 2012-12-21 NOTE — Progress Notes (Signed)
VACCINE SCREENING/ORDER WHEN CONTRAINDICATIONS PRESENT    Order date: 12/21/2012  Ordering provider: Marisa Sprinkles  Clinic stock used: YES   Interpreter used?: None  Vaccine information sheet(s) discussed, patient/parent/guardian verbalized understanding? YES   Vaccines given: Tdap  VIS given 12/21/2012 by RAY, SARAH DIXON MA.      SCREENING: INACTIVATED VACCINES  NO 1. Do you have a high fever, severe cold-like symptoms or serious infection?  NO 2. Do you have any food allergies such as eggs, chicken, chicken feathers, chicken dander, or gelatin?  NO 3. Do you have any allergies to neomycin, streptomycin, or polymyxin?   NO 4. Have you had any severe reaction to a vaccine in the past such as a seizure, a convulsion from a high fever, or a paralysis problem called Guillain-Barre Syndrome? If so, which vaccine(s):     If the patient/parent/or guardian answered "yes" to any of the above questions, consult with the provider to review the responses prior to administering vaccination. Parents with a contraindication to immunization will not receive the immunization without a specific physician order to vaccinate the patient and discussion of risk and benefits related to the contraindication.     Physician Order for Administration of Vaccine(s) When Contraindications Are Present  I have reviewed the existence in this patient's health history of possible contraindication(s) to administration of vaccine. The benefits to this patient outweigh the potential risks of immunization. Administer vaccine(s):  Nash Dimmer, 12/21/2012 10:15 AM MA.

## 2012-12-21 NOTE — Progress Notes (Signed)
Ronnie Mason is a 62 year old male     BP 140/104  Pulse 76  Ht 6\' 4"  (1.93 m)  Wt 213 lb 4 oz (96.73 kg)  BMI 25.97 kg/m2    Chief Complaint   Patient presents with   . Establish Care     Previous [facility] Patient     Previous patient of Dr. Nicolasa Ducking, establishing care    He is also followed at the pain clinic for chronic neck and low back pain-last visit November 25, 2012  chronic neck and low back pain s/ lumbar and cervcial facet RFA  Ongoing physical therapy and trigger point injections  Amada Jupiter, DO, [facility]      It is felt that he might also have essential tremor    He is also followed by Dr. [name] seen December 2013 for hypertension  Under control       Tremor x 1-2 yrr  Comes on with pain and movement not with rest  In L arm/hand note      Had mri of low back  Weakness in back occasional when it hurt  Feel pain with walk up down stairs the weakness and pain  Has numbness in left foot and calf from the 80's    Other issues  Need epi pen-significant bee sting allergy with hives  Need lab=he said that he doesn't recall his lipids being absolutely horrible so we'll screen  Ear check-scale in both ears-assessment for plastics weeks  TDAP-needed as has grandchild      Patient Active Problem List    Diagnosis Date Noted   . Chronic pain [338.29] 12/21/2012     Now at [facility], DO, [facility]    Neck pain -burn c3-7 on left side per patient  Some trigger injection    Lumbar pain-since 80's better with exercise     . Tremor [781.0] 12/21/2012     Left arm and hand see note intention 12/21/2012 exam with both     . 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 Numbness and tingling of leg [782.0] 12/21/2012     Left calf -left foot long term suspect from back-suspect L 45     . Chronic back pain [724.5, 338.29] 12/21/2012     Mri in mid scape 1/214  Multilevel degenerative disc disease of the lumbar spine. Circumferential disc bulge at all levels below and including L1-2. No significant  central spinal canal stenosis or neuroforaminal narrowing.  2. Mild retrolisthesis of L5 on S1.           [name] Hyperlipidemia [272.4] 09/26/2012   . CERVICALGIA [723.1] 03/11/2010   . Syncope [780.2]    . HYPOTENSION  [458.9]    . HYPERTENSION  [401.9]    . 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 URI (upper respiratory infection) [465.9]          Past Medical History   Diagnosis Date   . Syncope    . HYPOTENSION     . HYPERTENSION     . Carotid Sinus Hypersensitivity    . URI (upper respiratory infection)    . History of EKG 5/97       Past Surgical History   Procedure Laterality Date   . Unlisted procedure hands/fingers     . Unlisted procedure femur/knee     . Anes; colonoscopy  2002     repeat in 3 years   . Anes; colonoscopy &  polypectomy  11/18/2007     repeat in 3 years       Outpatient Prescriptions Prior to Visit   Medication Sig Dispense Refill   . Cetirizine HCl 10 MG Oral Tab PRN bee sting       . Hydrochlorothiazide 12.5 MG Oral Tab None Entered       . HYDROCODONE-APAP (VICODIN) 5-500 MG Oral Tab as needed       . Labetalol HCl 200 MG Oral Tab Take 1 tablet by mouth twice daily  180 Tab  3   . Lisinopril 10 MG Oral Tab TAKE TWO TABLETS BY MOUTH TWICE DAILY  360 Tab  1     No facility-administered medications prior to visit.       Review of patient's allergies indicates:  Allergies   Allergen Reactions   . Lidocaine Rash     Lidocaine patch. PATIENT STATES HE IS ALLERGIC TO THE ADHESIVE NOT THE PATCH.           Review of Systems   Musculoskeletal: Positive for back pain.   Skin: Negative.         Aside from to 6 weak severe issue   Neurological:        He has noted a tremor-see above   Psychiatric/Behavioral: Negative.        Physical Exam   Constitutional: He appears well-developed and well-nourished.   HENT:   Head: Normocephalic and atraumatic.   Neck: Normal range of motion. Neck supple. No tracheal deviation present. No thyromegaly present.   Cardiovascular: Normal rate, regular rhythm, normal  heart sounds and intact distal pulses.  Exam reveals no gallop.    No murmur heard.  Pulmonary/Chest: Effort normal and breath sounds normal. No stridor. No respiratory distress. He has no wheezes. He has no rales. He exhibits no tenderness.   Musculoskeletal:   He has intention tremor that is mild  He thought it was more on the left side but actually noticed in both    And he has cogwheeling   Lymphadenopathy:     He has no cervical adenopathy.   Neurological: No cranial nerve deficit.   Skin: Skin is warm and dry. No rash noted. No erythema. No pallor.   He has excoriation and irritation and slight scab on it and scab    He has noted this for about 6 weeks    No other areas of eczema is noted  Is not a swimmer   Psychiatric: He has a normal mood and affect. His behavior is normal. Judgment and thought content normal.     He has tremor-intentional finger to nose  It's very mild    He does have cogwheeling on exam    Impression    Bud was seen today for establish care.    Diagnoses and associated orders for this visit:    Tremor  - [facility]- --referral neurology  - VITAMIN B12 (COBALAMIN)    HTN (hypertension)  - COMPREHENSIVE METABOLIC PANEL    Screening, lipid  - LIPID PANEL    Need for DTaP vaccine  - TDAP VACCINE 7 YRS/> IM    Bee sting allergy  - EPINEPHrine 0.3 MG/0.3ML Injection Solution Auto-injector; Inject as instructed per patient package insert, 0.3 mg intramuscularly or subcutaneously into the thigh, if needed to treat anaphylaxis    Numbness and tingling of leg  - THYROID STIMULATING HORMONE  - T4, FREE  - HEMOGLOBIN A1C, HPLC    Eczema of external ear, bilateral  -  Neomycin-Polymyxin-HC 3.5-10000-1 Otic Solution; Place 4 drops into affected ear(s) 4 times a day    Other Orders  - Diclofenac Sodium (VOLTAREN) 1 % Transdermal Gel; Apply once daily to neck, trapezius muscle, and low back                 There are no discontinued medications.      Current Outpatient Prescriptions   Medication Sig   .  Cetirizine HCl 10 MG Oral Tab PRN bee sting   . Diclofenac Sodium (VOLTAREN) 1 % Transdermal Gel Apply once daily to neck, trapezius muscle, and low back   . EPINEPHrine 0.3 MG/0.3ML Injection Solution Auto-injector Inject as instructed per patient package insert, 0.3 mg intramuscularly or subcutaneously into the thigh, if needed to treat anaphylaxis   . Hydrochlorothiazide 12.5 MG Oral Tab None Entered   . HYDROCODONE-APAP (VICODIN) 5-500 MG Oral Tab as needed   . Labetalol HCl 200 MG Oral Tab Take 1 tablet by mouth twice daily   . Lisinopril 10 MG Oral Tab TAKE TWO TABLETS BY MOUTH TWICE DAILY   . Neomycin-Polymyxin-HC 3.5-10000-1 Otic Solution Place 4 drops into affected ear(s) 4 times a day     No current facility-administered medications for this visit.         Return in about 2 months (around 02/20/2013).        Plan/Discussion  The patient is establishing care    He is currently being seen at the pain clinic for neck pain  And for back pain    Per patient he has been having difficulty with intention tremor  And I do sense that he has cogwheeling  And I suspect that he might have Parkinson or Parkinson features  Referral to neurology    He is a new grandchild Tdap given    He has had about 6 weeks of excoriation to his earlobes after aspect  Non-swimmer  At the site has only been a short time will treat with topical antibiotic and if it continues he needs to be seen in clinic in followup    Blood pressure is controlled and he'll followup with Dr. Aundria Rud    History of severe bee sting allergy withHives  He tells me that he has Benadryl  He does not want to have prednisone on hand      45  min face to face greater than 50 %  Spent in counsel and coordinating  care

## 2012-12-22 LAB — HEMOGLOBIN A1C, HPLC: Hemoglobin A1C: 5 % (ref 4.0–6.0)

## 2012-12-22 NOTE — Progress Notes (Signed)
Office Visit on 12/21/12   COMPREHENSIVE METABOLIC PANEL       Result Value Range    Sodium 139  136 - 145 mEq/L    Potassium 4.5  3.7 - 5.2 mEq/L    Chloride 99  98 - 108 mEq/L    Carbon Dioxide, Total 28  22 - 32 mEq/L    Anion Gap 12 (*) 3 - 11    Glucose 110  62 - 125 mg/dL    Urea Nitrogen 31 (*) 8 - 21 mg/dL    Creatinine 2.84 (*) 0.51 - 1.18 mg/dL    Protein (Total) 7.2  6.0 - 8.2 g/dL    Albumin 4.7  3.5 - 5.2 g/dL    Bilirubin (Total) 1.1  0.2 - 1.3 mg/dL    Calcium 13.2  8.9 - 10.2 mg/dL    AST (GOT) 30  15 - 40 U/L    Alkaline Phosphatase (Total) 65  37 - 159 U/L    ALT (GPT) 29  10 - 48 U/L    Gfr, Calc, European American 53 (*) >59 mL/min    GFR, Calc, African American >60  >59 mL/min    GFR, Information        Value: Calculated GFR in mL/min/1.73 m2 by MDRD equation.      Inaccurate with changing renal function.  See      [email or web address]://[email or web address]   LIPID PANEL       Result Value Range    Cholesterol (Total) 308 (*) <200 mg/dL    Triglyceride 440  <102 mg/dL    Cholesterol (HDL) 725  >40 mg/dL    Cholesterol (LDL) 366 (*) <130 mg/dL    Non-HDL Cholesterol 176 (*) 0 - 159 mg/dL    Cholesterol/ HDL Ratio 2.3      Lipid Panel, Additional Info.        Value:       (NOTE)      For complete information to help interpret the lipid       panel, please       use the National Cholesterol Education Program (NCEP)       guideline based       reference range comments found here:      http://web.labmed.TelephoneAid.tn   VITAMIN B12 (COBALAMIN)       Result Value Range    Vitamin B12 (Cobalamin) 197  180 - 914 pg/mL    Vitamin B12 (Cobalamin)        Normal:  180-914 pg/mL      Vitamin B12 (Cobalamin) Indeterminate:  145-179 pg/mL      Vitamin B12 (Cobalamin)     Deficient:  <145 pg/mL     THYROID STIMULATING HORMONE       Result Value Range    Thyroid Stimulating Hormone 0.894  0.400 - 5.000 uIU/mL   T4, FREE       Result Value Range    Thyroxine (Free) 0.9  0.6 -  1.2 ng/dL   HEMOGLOBIN Y4I, HPLC       Result Value Range    Hemoglobin A1C by HPLC 5.0  4.0 - 6.0 %

## 2012-12-27 ENCOUNTER — Ambulatory Visit (HOSPITAL_BASED_OUTPATIENT_CLINIC_OR_DEPARTMENT_OTHER): Payer: No Typology Code available for payment source | Attending: Anesthesiology | Admitting: Anesthesiology

## 2012-12-27 DIAGNOSIS — M545 Low back pain, unspecified: Secondary | ICD-10-CM | POA: Insufficient documentation

## 2012-12-28 ENCOUNTER — Encounter: Payer: PRIVATE HEALTH INSURANCE | Admitting: Internal Medicine

## 2013-02-15 ENCOUNTER — Encounter (INDEPENDENT_AMBULATORY_CARE_PROVIDER_SITE_OTHER): Payer: Self-pay | Admitting: Neurology

## 2013-02-15 ENCOUNTER — Ambulatory Visit (INDEPENDENT_AMBULATORY_CARE_PROVIDER_SITE_OTHER): Payer: No Typology Code available for payment source | Admitting: Neurology

## 2013-02-15 VITALS — BP 171/123 | HR 84 | Wt 215.0 lb

## 2013-02-15 DIAGNOSIS — M545 Low back pain, unspecified: Secondary | ICD-10-CM

## 2013-02-15 HISTORY — DX: Low back pain, unspecified: M54.50

## 2013-02-15 NOTE — Patient Instructions (Signed)
1. You have a diagnosis of "Essential Tremor."  You do not have Parkinson's disease.    2. Medications can help your tremor-I recommend nadolol 20 mg daily.  Please bring this up with Dr. Aundria Rud when you see him.    3. Return to see me in follow up in 1 month.

## 2013-02-15 NOTE — Progress Notes (Signed)
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    For the last several months the patient and his wife then aware of the tremulousness affecting his upper extremities, particularly the left arm.  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  He has noticed the problem while typing, where he will miss acute stroke.  He is experienced difficulty holding a glass.  It is made is writing somewhat less legible.  He denies any micrographia.  He denies any sensation of stiffness in his limbs.  His gait is not impaired.  There is no history of sleep disorder.    There is no associated weakness or rigidity in the limbs.  He has a problem with chronic back and neck discomfort that went more severe are associated with exacerbation of his pain.  He is accompanied by his wife today and she expresses concern that this phenomenon might be related to a disorder of the brain.    His tremor is not improved  By alcohol nor is it worsened by caffeine.    He has a history of chronic low back pain that recently worsened in association with some weight gain.  When severe he describes his legs as "rubbery."  He denies any bowel or bladder difficulties.    He has had extensive treatments for "neck and nerve damage" that included "radiofrequency ablation" both in his neck and his lower back.  He has also recently had epidural steroid injections in his low back.  Reportedly, MRI scans of the lumbosacral spine performed at [facility] have not demonstrated evidence of spinal neuropathy, but demonstrated degenerative disc disease and facet arthropathy.    He has a long-standing history of left lower extremity hypoesthesia, affecting in particular the sole of the foot since a bout of severe back pain in 1985.  He has more recently noted similar symptoms over the external aspect of the right foot extending to the small toe.    ROS:  General   Headache: No   Wt Change: Yes   Fever: No   Fatigue: No   Appetite Change: No  Eyes   Eye Disease No  ENT   Hearing Problems: No   Sinus Problems: No   Mouth/throat sores: No   CV   Chest Pain: No   Palpitations: No  Respiratory   Cough: No   Dyspnea: No  GI   Bowel changes: No  GU   Urinating difficulty: No  M/S    Arthralgias/Stiffness/Swelling: No   Back Pain Yes    Neck pain Yes Skin   Rash/skin changes: No  Neuropsych   Memory problems: No   Concentration problems: No   Depression: No   Anxiety: No   Sleep problems: No  Endocrine   Polyuria/polydipsia: No  Hematological   Bleeding/bruising: No          Past Medical History   Diagnosis Date   . Syncope    . HYPOTENSION     . HYPERTENSION     . Carotid Sinus Hypersensitivity    . URI (upper respiratory infection)    . History of EKG 5/97   . Lipidemia    . Chronic kidney disease      Current Outpatient Prescriptions   Medication Sig   . Cetirizine HCl 10 MG Oral Tab PRN bee sting   . Cyanocobalamin 1000 MCG Oral Tab one per day over-the-counter started 5/21 /2012 this  level borderline low   . Diclofenac Sodium (VOLTAREN) 1 % Transdermal Gel Apply once daily to neck, trapezius muscle, and low back   . EPINEPHrine 0.3 MG/0.3ML Injection Solution Auto-injector Inject as instructed per patient package insert, 0.3 mg intramuscularly or subcutaneously into the thigh, if needed to treat anaphylaxis   . Hydrochlorothiazide 12.5 MG Oral Tab None Entered   . HYDROCODONE-APAP (VICODIN) 5-500 MG Oral Tab as needed   . Labetalol HCl 200 MG Oral Tab Take 1 tablet by mouth twice daily   . Lisinopril 10 MG Oral Tab TAKE TWO TABLETS BY MOUTH TWICE DAILY   . Neomycin-Polymyxin-HC 3.5-10000-1 Otic Solution Place 4 drops into affected ear(s) 4 times a day     No current facility-administered medications for this visit.       Family History   Problem Relation Age of Onset   . Colon Cancer Father    . Heart (other) Mother      MI   . Other Family Hx       myelodysplasia   Mother died a 35 MI.Mother with alcoholism. Brother  suicide 48. No chronic pain.   Family history is unremarkable for tremor.    History     Social History   . Marital Status: Married     Spouse Name: N/A     Number of Children: N/A   . Years of Education: N/A     Social History Main Topics   . Smoking status: Never Smoker    . Smokeless tobacco: Never Used   . Alcohol Use: Yes   . Drug Use: No   . Sexually Active: Not on file     Other Topics Concern   . Not on file     Social History Narrative   . No narrative on file   The patient has one alcoholic drink per day during the week and perhaps 2 or 3 per day over the weekends.  He has 2 daughters and one grandson.  The family has 2 homes and [place] and one on [place].    This is a well-developed, over nourished middle-aged man in no acute distress with somewhat diminished facial expressivity..  BP 171/123  Pulse 84  Wt 215 lb (97.523 kg)  BMI 26.18 kg/m2  Mental status: He is alert and oriented 3 with intact attention, language, memory, fund of knowledge.  Cranial nerves: Funduscopy is unremarkable.  There is no visual field deficit.  There is no ocular motor abnormality.  No facial sensory loss or weakness to face neck, tongue, or palate.  Motor: There are no abnormalities of tone and bulk or power except for weakness of the left gastrocnemius noted with toe raises. There is mild cogwheeling that improves when the patient relaxes.  Reflexes are preserved and symmetrical except for loss of the left Achilles reflex.  Plantar responses are flexor.  The gait is symmetrical with a broad station .  A postural tremor is noted in the upper extremities, more evident on the left than the right.  There is no dysmetria.   Sensory: There is hypoesthesia in the S1 dermatome on the left.  Vibration and position sensation are preserved.    HEENT: No cervical bruit.  Chest: Clear.  Cardiovascular: No murmur.  Abdomen: Nontender.  Liver edge nonpalpable.  Skin: No stigmata.    MRI scan of the lumbosacral spine from  [facility] is reviewed and as noted above.    Office Visit on 12/21/2012  Component Date Value Range Status   . Sodium 12/21/2012 139  136 - 145 mEq/L Final   . Potassium 12/21/2012 4.5  3.7 - 5.2 mEq/L Final   . Chloride 12/21/2012 99  98 - 108 mEq/L Final   . Carbon Dioxide, Total 12/21/2012 28  22 - 32 mEq/L Final   . Anion Gap 12/21/2012 12* 3 - 11 Final   . Glucose 12/21/2012 110  62 - 125 mg/dL Final   . Urea Nitrogen 12/21/2012 31* 8 - 21 mg/dL Final   . Creatinine 16/05/9603 1.38* 0.51 - 1.18 mg/dL Final   . Protein (Total) 12/21/2012 7.2  6.0 - 8.2 g/dL Final   . Albumin 54/04/8118 4.7  3.5 - 5.2 g/dL Final   . Bilirubin (Total) 12/21/2012 1.1  0.2 - 1.3 mg/dL Final   . Calcium 14/78/2956 10.1  8.9 - 10.2 mg/dL Final   . AST (GOT) 21/30/8657 30  15 - 40 U/L Final   . Alkaline Phosphatase (Total) 12/21/2012 65  37 - 159 U/L Final   . ALT (GPT) 12/21/2012 29  10 - 48 U/L Final   . Gfr, Calc, European American 12/21/2012 53* >59 mL/min Final   . GFR, Calc, African American 12/21/2012 >60  >59 mL/min Final   . GFR, Information 12/21/2012    Final                    Value:Calculated GFR in mL/min/1.73 m2 by MDRD equation.                          Inaccurate with changing renal function.  See                          http://depts.ThisTune.it.html   . Cholesterol (Total) 12/21/2012 308* <200 mg/dL Final   . Triglyceride 12/21/2012 135  <150 mg/dL Final   . Cholesterol (HDL) 12/21/2012 132  >40 mg/dL Final   . Cholesterol (LDL) 12/21/2012 149* <130 mg/dL Final   . Non-HDL Cholesterol 12/21/2012 176* 0 - 159 mg/dL Final   . Cholesterol/ HDL Ratio 12/21/2012 2.3   Final   . Lipid Panel, Additional Info. 12/21/2012    Final                    Value:                          (NOTE)                          For complete information to help interpret the lipid                           panel, please                           use the National Cholesterol Education Program (NCEP)                            guideline based                           reference range comments found here:  http://web.labmed.TelephoneAid.tn   . Vitamin B12 (Cobalamin) 12/21/2012 197  180 - 914 pg/mL Final   . Vitamin B12 (Cobalamin) 12/21/2012        Normal:  180-914 pg/mL   Final   . Vitamin B12 (Cobalamin) 12/21/2012 Indeterminate:  145-179 pg/mL   Final   . Vitamin B12 (Cobalamin) 12/21/2012     Deficient:  <145 pg/mL   Final   . Thyroid Stimulating Hormone 12/21/2012 0.894  0.400 - 5.000 uIU/mL Final   . Thyroxine (Free) 12/21/2012 0.9  0.6 - 1.2 ng/dL Final   . Hemoglobin Z6X by HPLC 12/21/2012 5.0  4.0 - 6.0 % Final     Diagnoses and associated orders for this visit:    Essential tremor    B12 deficiency    Back pain, lumbosacral    Lumbar radiculopathy, chronic    Neck pain        Because of the effects of his tremor on his work activity I recommended institution of a nonselective beta blocker today.  However, the patient has been having problems with blood pressure control.  He and his wife would be more comfortable consulting with Dr. Aundria Rud first before beginning any medication for the tremor.    I suspect he may be consuming toxic doses of alcohol, based on the findings on examination of a broad station while walking,as well as the possible rebound effects exacerbating his tremor intermittently.    I also discussed with him and his wife the basis for his focal neurological symptoms and chronic musculoskeletal pain.  Because his left leg symptoms are long-standing in nature and stable I have not suggested any further investigation.  I was surprised however to learn that the patient is never had any electrophysiological testing to verify the origins of his left calf weakness and insensitivity of the left foot.   Should he have any exacerbation of the symptoms further investigation would be warranted.    Patient Instructions   1. You have a diagnosis of  "Essential Tremor."  You do not have Parkinson's disease.    2. Medications can help your tremor-I recommend nadolol 20 mg daily.  Please bring this up with Dr. Aundria Rud when you see him.    3. Return to see me in follow up in 1 month.

## 2013-02-23 ENCOUNTER — Ambulatory Visit (INDEPENDENT_AMBULATORY_CARE_PROVIDER_SITE_OTHER): Payer: No Typology Code available for payment source | Admitting: Cardiovascular Disease

## 2013-02-23 ENCOUNTER — Encounter (INDEPENDENT_AMBULATORY_CARE_PROVIDER_SITE_OTHER): Payer: Self-pay | Admitting: Cardiovascular Disease

## 2013-02-23 VITALS — BP 110/72 | HR 72 | Ht 76.0 in | Wt 217.5 lb

## 2013-02-23 MED ORDER — NADOLOL 20 MG OR TABS
ORAL_TABLET | ORAL | Status: DC
Start: 2013-02-23 — End: 2013-04-20

## 2013-02-23 NOTE — Progress Notes (Signed)
Cardiovascular Clinical Evaluation Note    Primary Care Provider: [name]    Referring Provider: No ref. provider found    DOB:  November 04, 1950    CHIEF COMPLAINT:  Hyper and hypotension, falls    HISTORY OF PRESENT ILLNESS:  Patient has had at least 3 falls. He has recorded blood pressures of 85-93 systolic and  the likely etiology is hypotension. He did not totally lose consciousness.  His pain has generally been under better control which probably accounts for the lower blood pressures. In fact his pressure is only 110/72 in the office today.  He does have exacerbations typically if he is being treated for back pain. He reports blood pressure 150/110. This is always transient.  He has no palpitations. No chest pain or shortness of breath.    He has developed a tremor which impacts his work. As a result Dr. Miguel Dibble would like to place him on Nadolol.  He is currently taking labetalol for blood pressure control.  He has reduced his lisinopril to 30 mg daily.      PROBLEM LIST:  Patient Active Problem List    Diagnosis Date Noted   . CERVICALGIA [723.1] 03/11/2010   . Syncope [780.2]    . HYPOTENSION  [458.9]    . HYPERTENSION  [401.9]    . 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 URI (upper respiratory infection) [465.9]    . Tremor [781.0] 02/23/2013   . Essential tremor [333.1] 02/15/2013   . Back pain, lumbosacral [724.2, 724.6] 02/15/2013   . Lumbar radiculopathy, chronic [724.4] 02/15/2013   . Neck pain [723.1] 02/15/2013   . Chronic pain [338.29] 12/21/2012     Now at [facility], DO, Santiago Glad    Neck pain -burn c3-7 on left side per patient  Some trigger injection    Lumbar pain-since 80's better with exercise     . Tremor [781.0] 12/21/2012     Left arm and hand see note intention 12/21/2012 exam with both     . 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 Numbness and tingling of leg [782.0] 12/21/2012     Left calf -left foot long term suspect from back-suspect L 45     .  Chronic back pain [724.5, 338.29] 12/21/2012     Mri in mid scape 1/214  Multilevel degenerative disc disease of the lumbar spine. Circumferential disc bulge at all levels below and including L1-2. No significant central spinal canal stenosis or neuroforaminal narrowing.  2. Mild retrolisthesis of L5 on S1.           . B12 deficiency [266.2] 12/21/2012     Borderline low 5 /21/2014     . Chronic renal insufficiency, stage III (moderate) [585.3] 12/21/2012     5/ 2013     . Hyperlipidemia [272.4] 09/26/2012       MEDICATIONS:    Current Outpatient Prescriptions   Medication Sig   . Cetirizine HCl 10 MG Oral Tab PRN bee sting   . Cyanocobalamin 1000 MCG Oral Tab one per day over-the-counter started 5/21 /2012 this level borderline low   . Diclofenac Sodium (VOLTAREN) 1 % Transdermal Gel Apply once daily to neck, trapezius muscle, and low back   . EPINEPHrine 0.3 MG/0.3ML Injection Solution Auto-injector Inject as instructed per patient package insert, 0.3 mg intramuscularly or subcutaneously into the thigh, if needed to treat anaphylaxis   . Hydrochlorothiazide 12.5 MG Oral Tab None Entered   .  HYDROCODONE-APAP (VICODIN) 5-500 MG Oral Tab as needed   . Lisinopril 10 MG Oral Tab TAKE TWO TABLETS BY MOUTH TWICE DAILY   . Nadolol 20 MG Oral Tab 1 TABLET DAILY   . Neomycin-Polymyxin-HC 3.5-10000-1 Otic Solution Place 4 drops into affected ear(s) 4 times a day     No current facility-administered medications for this visit.       ALLERGIES:  Bee venom and Lidocaine      PSFH: Accompanied by his wife. His falls have been traumatic with laceration on his left fourth head. On one occasion he tripped on the deck at home and had a twinge of back pain which then caused him to fall due to leg weakness.  He continues with physical therapy.    REVIEW OF SYSTEMS:  See Cardiovascular HPI for pertinent positives and negatives.   RESPIRATORY:  No sputum, hemoptysis.    PHYSICAL EXAM    CONSTITUTIONAL:  This is a well-nourished male in  no distress.   BP 110/72  Pulse 72  Ht 6\' 4"  (1.93 m)  Wt 217 lb 8 oz (98.657 kg)  BMI 26.49 kg/m2  NECK:  Jugular venous pressure normal.  Thyroid is not enlarged.  RESPIRATORY:   Clear to percussion and auscultation.  CARDIOVASCULAR:  Regular rhythm.  S1 and S2 normal. No S3, S4, No murmur.  Carotid upstrokes are brisk without bruits. GASTROINTESTINAL: Abdomen without masses or tenderness. No hepatosplenomegaly.  EXTREMITIES:  No clubbing, cyanosis, edema or tenderness noted.      IMPRESSION :   1. Episodic hypotension with near syncope and fall: this simply can't be allowed to occur. He has too many musculoskeletal problems already and his risk is related to low blood pressure not high. His hypertensive episodes are typically triggered by pain and are transient.  2. Tremor    PLAN:    Medications will be adjusted as follows. He is to discontinue the labetalol which is a more potent blood pressure medicine. He will replace it with the natural wall recommended by Dr. Miguel Dibble at 20 mg daily. He is to try lisinopril at 20 mg b.i.d. However monitor blood pressure to make sure we do not have any tendency towards the low range. He clearly doesn't tolerate blood pressure even in the low normal range.  He is to  return in 3 months.    Electronically signed by Olene Craven, MD    02/23/2013  9:39 AM    CC:   Judi Saa Viseskul  No ref. provider found

## 2013-02-23 NOTE — Patient Instructions (Addendum)
Stop labetalol  Start nadolol 20 mg daily  Resume 20 mg lisinopril twice daily  Watch blood pressure

## 2013-03-17 ENCOUNTER — Encounter (HOSPITAL_BASED_OUTPATIENT_CLINIC_OR_DEPARTMENT_OTHER): Payer: Self-pay | Admitting: Anesthesiology

## 2013-03-17 ENCOUNTER — Ambulatory Visit (HOSPITAL_BASED_OUTPATIENT_CLINIC_OR_DEPARTMENT_OTHER): Payer: No Typology Code available for payment source | Attending: Anesthesiology | Admitting: Anesthesiology

## 2013-03-17 VITALS — BP 173/117 | HR 68 | Temp 98.4°F | Resp 16 | Ht 75.98 in | Wt 214.5 lb

## 2013-03-17 DIAGNOSIS — M542 Cervicalgia: Secondary | ICD-10-CM | POA: Insufficient documentation

## 2013-03-17 DIAGNOSIS — M549 Dorsalgia, unspecified: Secondary | ICD-10-CM | POA: Insufficient documentation

## 2013-03-17 MED ORDER — DICLOFENAC SODIUM 1 % TD GEL
TRANSDERMAL | Status: DC
Start: 2013-03-17 — End: 2014-10-04

## 2013-03-17 MED ORDER — HYDROCODONE-ACETAMINOPHEN 5-325 MG OR TABS
2.0000 | ORAL_TABLET | Freq: Two times a day (BID) | ORAL | Status: DC
Start: 2013-03-17 — End: 2013-12-15

## 2013-03-17 NOTE — Patient Instructions (Signed)
1. Continue PT, but cut down to every two weeks, then try pool therapy on the other weeks  2. Refill pain RX  3. Next visit for IMS vs Trigger point therapy for low back in conjunction with PT and pool therapy

## 2013-03-17 NOTE — Progress Notes (Signed)
HISTORY OF PRESENT ILLNESS:    Ronnie Mason is a 62 year old gentleman who returns today for a follow up of his chronic neck and low back pain.  He reported having significant pain relief in the low back and left hamstring following left L5 TFESI 5/14, but the relief only lasted 2 weeks or so. Currently, he is undergoing lumbar decompression traction in addition to ultrasound deep tissue massage and TENS unite with physical therapy. He finds PT to be very helpful, but needs a new rx. He is now being seen by a neurologist for his essential tremor and is very pleased with the results stating that his tremor is down by %80.    As for his cervical pain IMS has been very helpful. He hopes to have a repeat procedure today. Since starting his treatments, he has been able to be much more active. He recently had his annual office camping trip which he puts on at his house.     He has been taking on average 1 tab of 5mg  Vicodin per day. He has been stable on this dose, and tolerating well    Past interventions  12/27/12 L5-S1 interlaminar epidural steroid injection under fluoroscopic guidance. Relief lasted only 2 weeks  08/31/2012 Left L5 TFESI 6 days of 100% pain relief of left sided low back and lower extremity pain with now almost back to baseline pain  03/30/2012 Left L4/5, L5/S1 facet RFA  02/10/2012 Left C6/7 facet RFA  07/03/2011 Left L4/5, L5/S1 facet RFA  05/12/2011 Left C4/5, C5/6 facet RFA  several trigger point injections in office        Past medical/surgical history, social history, family history reviewed from previous notes.  No changes.    Outpatient Prescriptions Prior to Visit   Medication Sig Dispense Refill   . Cetirizine HCl 10 MG Oral Tab PRN bee sting       . Cyanocobalamin 1000 MCG Oral Tab one per day over-the-counter started 5/21 /2012 this level borderline low  1 Tab  1   . Diclofenac Sodium (VOLTAREN) 1 % Transdermal Gel Apply once daily to neck, trapezius muscle, and low back       . EPINEPHrine 0.3  MG/0.3ML Injection Solution Auto-injector Inject as instructed per patient package insert, 0.3 mg intramuscularly or subcutaneously into the thigh, if needed to treat anaphylaxis  3 Device  1   . Hydrochlorothiazide 12.5 MG Oral Tab None Entered       . HYDROCODONE-APAP (VICODIN) 5-500 MG Oral Tab as needed       . Lisinopril 10 MG Oral Tab TAKE TWO TABLETS BY MOUTH TWICE DAILY  360 Tab  1   . Nadolol 20 MG Oral Tab 1 TABLET DAILY  30 tablet  3   . Neomycin-Polymyxin-HC 3.5-10000-1 Otic Solution Place 4 drops into affected ear(s) 4 times a day  10 mL  0     No facility-administered medications prior to visit.     ROS  A complete ROS was performed and was negative except those items listed in the HPI    ALLERGIES:    THE PATIENT DID HAVE A REACTION TO THE LIDODERM PATCH THAT WAS PRESCRIBED AT HIS LAST VISIT.  HE REPORTS ITCHING AND SKIN REACTION ON HIS TORSO AND NECK WELL BEYOND THE BORDERS OF THE PATCH.  ONCE HE REMOVED THE PATCH THIS IMPROVED.  He has no allergic reaction to lidocaine alone.    PHYSICAL EXAMINATION:    GENERAL:  The patient is alert and oriented,  pleasant and cooperative.  HEENT:  Normocephalic, atraumatic.  Anicteric sclerae.  LUNGS:  No increased work of breathing.  No accessory muscle use.  CARDIAC:  Warm extremities times 4.  No vasomotor instability.  ABDOMEN:  Nontender, nondistended.  MUSCULOSKELETAL:    Limited ROM particular with left side bending and right rotation of neck. Tender points along cervical paraspinal musculature and trapezius    NEUROLOGIC:  The patient has 5/5 strength in bilateral lower extremities     IMAGING:    MRI of the lumbar spine from August 19, 2012.  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  There was circumferential disk bulge at all levels that was most pronounced at L3-L4 and L5-S1.  There is narrowing of L4-L5 and L5-S1 neural foramina, but no clear evidence of impingement on the exiting nerve roots.  There is also mild  retrolisthesis of L5 on S1.    ASSESSMENT:    Ronnie Mason is a 62 year old gentleman with known chronic neck and low back pain s/ lumbar and cervcial facet RFA.  He did not having lasting relief from his last epidural injection.    PLAN:    1. Prescritpion for Vicodin 5-325 (new formulary) 1-2 tabs/day #60 (should last 2 months), provided today.  He will return in 3 months for f/u.  2.  Repeat IMS today, please see procedure note below  3. We hope that the patient's pain will continue to improve with physical therapy and a home exercise program. I also recommend that he enroll in pool therapy. Perhaps he can do PT every other week and pool every other week  Trigger Point Injections    DIAGNOSIS: Myofacial Pain Syndrome   DESCRIPTION OF PROCEDURE: The procedure risks, hazards and alternatives were discussed with the patient and a proper consent was obtained. The area over the myofascial spasm was prepped with alcohol utilizing sterile technique. After isolating it between two palpating fingertips a 30-gauge accupuncture needle was placed in the center of the myofascial spasms. The patient tolerated the procedure well without any apparent difficulties or complications.     Muscles treated  1. Bilateral upper and lower fibers of trapezius  2. Bilateral levator scapulae  3. Bilateral splenius capitis  4. Bilateral splenius cervicis   5. Bilateral semispinalis capitis     After treatment there was noticeable improvement both subjective and objective of ROM with cervical sidebending and rotation.

## 2013-03-22 ENCOUNTER — Ambulatory Visit (INDEPENDENT_AMBULATORY_CARE_PROVIDER_SITE_OTHER): Payer: No Typology Code available for payment source | Admitting: Neurology

## 2013-03-22 ENCOUNTER — Encounter (INDEPENDENT_AMBULATORY_CARE_PROVIDER_SITE_OTHER): Payer: Self-pay | Admitting: Neurology

## 2013-03-22 VITALS — BP 145/104 | HR 61 | Ht 75.0 in | Wt 214.0 lb

## 2013-03-22 NOTE — Patient Instructions (Addendum)
1. Continue your nadolol.  You may wish to increase the dose if the tremor is not adequately controlled.    2. Consider an EMG and nerve conduction study to investigate your low back discomfort and left leg numbness (your exam shows that you likely have an S1 spinal neuropathy-"pinched nerve").  If surgery is considered this test should be performed to confirm the diagnosis.    3. Return for follow 3 months.

## 2013-03-22 NOTE — Progress Notes (Signed)
He returns for follow up of essential tremor.    He has responded to nadolol 20 mg daily.  His tremor has improved 75%.    He has chronic low back pain but has increased with weight gain. 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    Well-developed middle-aged man in no acute distress.  BP 145/104  Pulse 61  Ht 6\' 3"  (1.905 m)  Wt 214 lb (97.07 kg)  BMI 26.75 kg/m2  He is alert and oriented.  There is no facial weakness or oculomotor abnormality.  The gait is symmetrical.  There is no incoordination the limbs.    Diagnoses and associated orders for this visit:    Essential tremor    Lumbosacral radiculopathy at S1  Comments: Left        Patient Instructions   1. Continue your nadolol.  You may wish to increase the dose if the tremor is not adequately controlled.    2. Consider an EMG and nerve conduction study to investigate your low back discomfort and left leg numbness (your exam shows that you likely have an S1 spinal neuropathy-"pinched nerve").  If surgery is considered this test should be performed to confirm the diagnosis.    3. Return for follow 3 months.

## 2013-04-10 ENCOUNTER — Ambulatory Visit (INDEPENDENT_AMBULATORY_CARE_PROVIDER_SITE_OTHER): Payer: No Typology Code available for payment source | Admitting: Cardiovascular Disease

## 2013-04-20 ENCOUNTER — Encounter (INDEPENDENT_AMBULATORY_CARE_PROVIDER_SITE_OTHER): Payer: Self-pay | Admitting: Cardiovascular Disease

## 2013-04-20 ENCOUNTER — Ambulatory Visit (INDEPENDENT_AMBULATORY_CARE_PROVIDER_SITE_OTHER): Payer: No Typology Code available for payment source | Admitting: Cardiovascular Disease

## 2013-04-20 VITALS — BP 180/130 | HR 70 | Ht 76.0 in | Wt 212.0 lb

## 2013-04-20 MED ORDER — LISINOPRIL 20 MG OR TABS
20.0000 mg | ORAL_TABLET | Freq: Two times a day (BID) | ORAL | Status: DC
Start: 2013-04-20 — End: 2014-04-28

## 2013-04-20 MED ORDER — NADOLOL 40 MG OR TABS
40.0000 mg | ORAL_TABLET | Freq: Every day | ORAL | Status: DC
Start: 2013-04-20 — End: 2014-03-28

## 2013-04-20 NOTE — Patient Instructions (Signed)
Increase Nadolol to 40 mg daily

## 2013-04-20 NOTE — Progress Notes (Signed)
Cardiovascular Clinical Evaluation Note    Primary Care Provider: [name]    Referring Provider: No ref. provider found    DOB:  July 23, 1951    CHIEF COMPLAINT:  Hyper and hypotension    HISTORY OF PRESENT ILLNESS:  Ronnie Mason brings with him a list of blood pressures. All of the elevations or when he is visiting doctors. At home his pressure goes down as low as 110/70. He does not think there's been any significant change.  He has had a great p.o. More lower back pain and is due to have injection of the spine in the lumbosacral region. In addition he is developing neuropathy which is going to be explored with EMG.  His neck is actually considerably better.  He reports no sense of palpitations. There is been no syncope or presyncope.  He denies chest pain or shortness of breath.    PROBLEM LIST:  Patient Active Problem List    Diagnosis Date Noted   . CERVICALGIA [723.1] 03/11/2010   . Syncope [780.2]    . HYPOTENSION  [458.9]    . HYPERTENSION  [401.9]    . 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 URI (upper respiratory infection) [465.9]    . Lumbosacral radiculopathy at S1 [724.4] 03/22/2013     Left     . Fall [E888.9] 02/23/2013     BP < 100  Occasioanal tri[p and leg weakness     . Essential tremor [333.1] 02/15/2013   . Back pain, lumbosacral [724.2, 724.6] 02/15/2013   . Lumbar radiculopathy, chronic [724.4] 02/15/2013   . Neck pain [723.1] 02/15/2013   . Chronic pain [338.29] 12/21/2012     Now at [facility], DO, Santiago Glad    Neck pain -burn c3-7 on left side per patient  Some trigger injection    Lumbar pain-since 80's better with exercise     . 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 Numbness and tingling of leg [782.0] 12/21/2012     Left calf -left foot long term suspect from back-suspect L 45     . Chronic back pain [724.5, 338.29] 12/21/2012     Mri in mid scape 1/214  Multilevel degenerative disc disease of the lumbar spine. Circumferential disc bulge at all levels  below and including L1-2. No significant central spinal canal stenosis or neuroforaminal narrowing.  2. Mild retrolisthesis of L5 on S1.           . B12 deficiency [266.2] 12/21/2012     Borderline low 5 /21/2014     . Chronic renal insufficiency, stage III (moderate) [585.3] 12/21/2012     5/ 2013     . Hyperlipidemia [272.4] 09/26/2012       MEDICATIONS:    Current Outpatient Prescriptions   Medication Sig   . Cetirizine HCl 10 MG Oral Tab PRN bee sting   . Cyanocobalamin 1000 MCG Oral Tab one per day over-the-counter started 5/21 /2012 this level borderline low   . Diclofenac Sodium (VOLTAREN) 1 % Transdermal Gel Apply once daily to neck, trapezius muscle, and low back   . EPINEPHrine 0.3 MG/0.3ML Injection Solution Auto-injector Inject as instructed per patient package insert, 0.3 mg intramuscularly or subcutaneously into the thigh, if needed to treat anaphylaxis   . Hydrochlorothiazide 12.5 MG Oral Tab None Entered   . Hydrocodone-Acetaminophen 5-325 MG Oral Tab Take 2 tablets by mouth 2 times a day.   . Lisinopril (PRINIVIL) 20 MG  Oral Tab Take 1 tablet (20 mg) by mouth every 12 hours.   . Nadolol 40 MG Oral Tab Take 1 tablet (40 mg) by mouth daily.   [name] Neomycin-Polymyxin-HC 3.5-10000-1 Otic Solution Place 4 drops into affected ear(s) 4 times a day     No current facility-administered medications for this visit.       ALLERGIES:  Bee venom and Lidocaine      PSFH: No hospitalizations or emergency room visits. It does appear that the low back pain has become more of a problem than the cervical pain. He does develop a tremor as well which apparently was better controlled on a transiently higher dose of Nadolol at 40 mg daily.    REVIEW OF SYSTEMS:  See Cardiovascular HPI for pertinent positives and negatives.   RESPIRATORY:  No sputum, hemoptysis.    PHYSICAL EXAM    CONSTITUTIONAL:  This is a well-nourished male in no distress.   BP 180/130  Pulse 70  Ht 6\' 4"  (1.93 m)  Wt 212 lb (96.163 kg)  BMI 25.82  kg/m2  NECK:  Jugular venous pressure normal.  Thyroid is not enlarged.  RESPIRATORY:   Clear to percussion and auscultation.  CARDIOVASCULAR:  Regular rhythm.  S1 and S2 normal. No S3, S4, No murmur.  Carotid upstrokes are brisk without bruits.  EXTREMITIES:  No clubbing, cyanosis, edema or tenderness noted.      IMPRESSION :   1. Hypertension, reactive  2. Previous hypotension with syncope due to medication  3. 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 Tremor    PLAN:    We will increase his Nadolol to 40 mg daily. He will continue to watch blood pressure and pulse.  In the absence of any significant change will plan to see him back in 6 months    Electronically signed by Olene Craven, MD    04/20/2013  12:47 PM    CC:   Judi Saa Viseskul  No ref. provider found

## 2013-04-21 ENCOUNTER — Ambulatory Visit (HOSPITAL_BASED_OUTPATIENT_CLINIC_OR_DEPARTMENT_OTHER): Payer: No Typology Code available for payment source | Attending: Anesthesiology | Admitting: Anesthesiology

## 2013-04-21 VITALS — BP 173/115 | HR 63 | Temp 98.1°F | Resp 18 | Ht 76.0 in | Wt 214.0 lb

## 2013-04-21 DIAGNOSIS — M549 Dorsalgia, unspecified: Secondary | ICD-10-CM | POA: Insufficient documentation

## 2013-04-21 DIAGNOSIS — G8929 Other chronic pain: Secondary | ICD-10-CM | POA: Insufficient documentation

## 2013-04-21 NOTE — Progress Notes (Signed)
HISTORY OF PRESENT ILLNESS:   [name] is a 62 year old gentleman who returns today for a follow up of his chronic neck and low back pain. He responded very well to upper back/cervical IMS and now would like lumbar IMS. He has noticed over the past 6 months increasing right leg numbness. He has had stable left leg numbness on the lateral aspect of his lower leg, fifth toe and bottom of his foot for about 30 years . He now notices increasing numbness on the bottom of his right foot and lateral aspect of his fifth toe on the right. He denies having shooting pains or electrical sensations down his legs. No loss of bowl or bladder function. He also notices increasing lower back pain. No fevers. His neurologist recommended obtaining an EMG study to determine the etiology of his leg numbness. He is also interested in being evaluated by a neurosurgeon as he feels that if things keep getting worse, he would like surgery.  Since starting his IMS treatments, he has been able to be much more active. He recently had his annual office camping trip which he puts on at his house. 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   He has been taking on average 1 tab of 5mg  Vicodin per day. He has been stable on this dose, and tolerating well     Past interventions   12/27/12 L5-S1 interlaminar epidural steroid injection under fluoroscopic guidance. Relief lasted only 2 weeks   08/31/2012 Left L5 TFESI 6 days of 100% pain relief of left sided low back and lower extremity pain with now almost back to baseline pain   03/30/2012 Left L4/5, L5/S1 facet RFA   02/10/2012 Left C6/7 facet RFA   07/03/2011 Left L4/5, L5/S1 facet RFA   05/12/2011 Left C4/5, C5/6 facet RFA   several trigger point injections/IMS in office     Past medical/surgical history, social history, family history reviewed from previous notes. No changes.      Outpatient Prescriptions Prior to Visit   Medication Sig Dispense Refill   . Cetirizine HCl 10 MG Oral Tab PRN bee sting       . Cyanocobalamin 1000 MCG Oral Tab one per day over-the-counter started 5/21 /2012 this level borderline low  1 Tab  1   . Diclofenac Sodium (VOLTAREN) 1 % Transdermal Gel Apply once daily to neck, trapezius muscle, and low back  1 Tube  5   . EPINEPHrine 0.3 MG/0.3ML Injection Solution Auto-injector Inject as instructed per patient package insert, 0.3 mg intramuscularly or subcutaneously into the thigh, if needed to treat anaphylaxis  3 Device  1   . Hydrochlorothiazide 12.5 MG Oral Tab None Entered       . Hydrocodone-Acetaminophen 5-325 MG Oral Tab Take 2 tablets by mouth 2 times a day.  60 tablet  0   . Lisinopril (PRINIVIL) 20 MG Oral Tab Take 1 tablet (20 mg) by mouth every 12 hours.  180 tablet  3   . Lisinopril 10 MG Oral Tab TAKE TWO TABLETS BY MOUTH TWICE DAILY  360 Tab  1   . Nadolol 20 MG Oral Tab 1 TABLET DAILY  30 tablet  3   . Nadolol 40 MG Oral Tab Take 1 tablet (40 mg) by mouth daily.  90 tablet  3   . Neomycin-Polymyxin-HC 3.5-10000-1 Otic Solution Place 4 drops  into affected ear(s) 4 times a day  10 mL  0     No facility-administered medications prior to visit.     ROS   A complete ROS was performed and was negative except those items listed in the HPI   ALLERGIES:   THE PATIENT DID HAVE A REACTION TO THE LIDODERM PATCH THAT WAS PRESCRIBED AT HIS LAST VISIT. HE REPORTS ITCHING AND SKIN REACTION ON HIS TORSO AND NECK WELL BEYOND THE BORDERS OF THE PATCH. ONCE HE REMOVED THE PATCH THIS IMPROVED. He has no allergic reaction to lidocaine alone.   PHYSICAL EXAMINATION:   GENERAL: The patient is alert and oriented, pleasant and cooperative.   HEENT: Normocephalic, atraumatic. Anicteric sclerae.   LUNGS: No increased work of breathing. No accessory muscle use.   CARDIAC: Warm extremities times 4. No vasomotor instability.   ABDOMEN: Nontender, nondistended.   MUSCULOSKELETAL:    Straight leg raise is negative bilaterally for radicular pain. He does however have very tight hamstrings.  Gait is non-antalgic. Multiple tenderpoints over lumbar spine, no piriformis tenderness, tender points over greater trochanter on the right. Very tight quadratus lumborum on the right.   NEUROLOGIC: 1+ bilateral achilles and patellar reflexes, decreased sensation to light touch over S1 dermatome on the left, decreased sensation to light touch over plantar aspect of right foot and lateral 5th toe, normal over calf.     IMAGING:   MRI of the lumbar spine from August 19, 2012. 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 There was circumferential disk bulge at all levels that was most pronounced at L3-L4 and L5-S1. There is narrowing of L4-L5 and L5-S1 bilateral neural foramina, but no clear evidence of impingement on the exiting nerve roots. There is also mild retrolisthesis of L5 on S1.     ASSESSMENT:   [name] is a 62 year old gentleman with known chronic neck and low back pain s/ lumbar and cervcial facet RFA and lumbar ESI. old gentleman with known chronic neck and low back pain s/ lumbar and cervcial facet RFA and lumbar ESI. 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 His MRI done January of this year did show narrowing at bilateral L4-5 and L5-S1 neural foramina.    PLAN:   1. Medications: He still has 16 pills of Vicodin 5-325. He will call when he needs a refill  2. New onset right sided foot numbness: An EMG study will help clarify the etiology of his new onset right sided symptoms. He will contact his neurologist to schedule an appointment. He can then revisit the possibility of a neurosurgical consult. He is not having any red-flag symptoms and at this time, and so I will not order a repeat MRI. However if his symptoms continue to get worse, a new MRI may be called for.  2. Repeat IMS today, please see procedure note below      DIAGNOSIS: Myofacial Pain Syndrome   DESCRIPTION OF PROCEDURE: The procedure risks, hazards and alternatives were  discussed with the patient and a proper consent was obtained. The area over the myofascial spasm was prepped with alcohol utilizing sterile technique. After isolating it between two palpating fingertips a 30-gauge accupuncture needle was placed in the center of the myofascial spasms. The patient tolerated the procedure well without any apparent difficulties or complications.   Muscles treated   1. Bilateral quadratus lumborum  2. Bilateral lumbar paraspinal musculature  3. Right hamstrings  4. Right external hip rotators

## 2013-06-01 ENCOUNTER — Other Ambulatory Visit (INDEPENDENT_AMBULATORY_CARE_PROVIDER_SITE_OTHER): Payer: Self-pay | Admitting: Internal Medicine

## 2013-06-02 ENCOUNTER — Telehealth (HOSPITAL_BASED_OUTPATIENT_CLINIC_OR_DEPARTMENT_OTHER): Payer: Self-pay | Admitting: Critical Care Medicine

## 2013-06-02 ENCOUNTER — Other Ambulatory Visit (INDEPENDENT_AMBULATORY_CARE_PROVIDER_SITE_OTHER): Payer: Self-pay | Admitting: Internal Medicine

## 2013-06-02 MED ORDER — NEOMYCIN-POLYMYXIN-HC 1 % OT SOLN
OTIC | Status: DC
Start: 2013-06-02 — End: 2013-11-21

## 2013-06-02 NOTE — Telephone Encounter (Signed)
Pt's chart reviewed  Pt was ordered hydrocodone/acetaminophen 5/325, #60, in 8/14, by Dr Amada Jupiter    Phone made to [facility], pharmacist  She reports that the prescription received not only has the medication dosage written in error, but is written on an old [facility] prescription pad with a phone # starting with "[phone]"   The pharmacist questions the legitimacy of the prescription  She is asked to fax the script to the clinic for verification    Phone call made to [place]  She reports that Dr Aviva Signs wrote the prescription for a hydrocodone/acetaminophen refill, at Dr Dale's request, but unfortunately wrote for 5/500 instead of 5/325.  Dr Vista Lawman did use an old prescription form for this refill  Mickeal Skinner also states that she sent the prescription by [employer] to [facility]    Return call made to [place] to authorize the change of dosage from hydrocodone/acetaminophen 5/500 to 5/325

## 2013-06-02 NOTE — Telephone Encounter (Signed)
Last visit- 12/21/12  Next visit- none    Rx written on 12/21/12, 10mL with 0 refills

## 2013-06-02 NOTE — Telephone Encounter (Signed)
Spoke with patient, complains of minor ear irritation.  He will call clinic on Monday if appointment is needed.

## 2013-06-02 NOTE — Telephone Encounter (Signed)
Please call patient and find out if he is ok  I filled it  To be seen next week if problem

## 2013-06-02 NOTE — Telephone Encounter (Signed)
CONFIRMED PHONE NUMBER:  [phone]       CALLERS FIRST AND LAST NAME: Lyla Son   FACILITY NAME: [facility] TITLE: Pharmacy   CALLERS RELATIONSHIP:OTHER: pharmacy   RETURN CALL: Detailed message on voicemail only     SUBJECT: Prescription Management   REASON FOR REQUEST: [facility] received fed ex prescription request for strength that doesn't exist     MEDICATION: Vicoden  CONCERN/QUESTION: needs verbal authorization to change the strength from 5-500 to 5-325   PRESCRIBING PROVIDER: Stogicza  DOSE TAKING NOW: see above    PHARMACY NAME, LOCATION, & PHONE #: [facility] please call for more information

## 2013-06-08 ENCOUNTER — Other Ambulatory Visit: Payer: Self-pay

## 2013-07-10 ENCOUNTER — Other Ambulatory Visit: Payer: Self-pay | Admitting: Internal Medicine

## 2013-07-11 ENCOUNTER — Telehealth: Payer: Self-pay

## 2013-07-11 DIAGNOSIS — F39 Unspecified mood [affective] disorder: Secondary | ICD-10-CM

## 2013-07-11 DIAGNOSIS — F17200 Nicotine dependence, unspecified, uncomplicated: Secondary | ICD-10-CM

## 2013-07-11 NOTE — Telephone Encounter (Signed)
Pt has been denied a request for a refill on his medication and needs to talk with someone about getting just enough until he can get an appt scheduled  Patient is completely out

## 2013-07-11 NOTE — Telephone Encounter (Signed)
I'm happy to refill the medication until he can get in for a visit.  When does he think that will be?

## 2013-07-12 MED ORDER — SERTRALINE HCL 50 MG PO TABS: 50.0000 mg | ORAL_TABLET | Freq: Every day | ORAL | Status: DC

## 2013-07-12 NOTE — Telephone Encounter (Signed)
Meds ordered this encounter  Medications  . sertraline (ZOLOFT) 50 MG tablet    Sig: Take 1 tablet (50 mg total) by mouth daily.    Dispense:  30 tablet    Refill:  0   Please encourage him to contact the [facility] Financial Aid Office [phone].

## 2013-07-12 NOTE — Telephone Encounter (Signed)
Thank you. I called him to advise. He has questions about his bill from January, I have asked Katie in billing dept to speak to him, she will call him.

## 2013-07-12 NOTE — Telephone Encounter (Signed)
Called him, when can he come in? He states he has no insurance. He does not have any idea when he can come in, he will not give a time frame at all. Pended one month supply, please advise.

## 2013-11-21 ENCOUNTER — Ambulatory Visit (INDEPENDENT_AMBULATORY_CARE_PROVIDER_SITE_OTHER): Payer: No Typology Code available for payment source | Admitting: Cardiovascular Disease

## 2013-11-21 ENCOUNTER — Encounter (INDEPENDENT_AMBULATORY_CARE_PROVIDER_SITE_OTHER): Payer: Self-pay | Admitting: Cardiovascular Disease

## 2013-11-21 VITALS — BP 164/110 | HR 68 | Ht 76.0 in | Wt 214.4 lb

## 2013-11-21 DIAGNOSIS — E785 Hyperlipidemia, unspecified: Secondary | ICD-10-CM

## 2013-11-21 DIAGNOSIS — R251 Tremor, unspecified: Secondary | ICD-10-CM

## 2013-11-21 DIAGNOSIS — R55 Syncope and collapse: Secondary | ICD-10-CM

## 2013-11-21 DIAGNOSIS — I959 Hypotension, unspecified: Secondary | ICD-10-CM

## 2013-11-21 DIAGNOSIS — I10 Essential (primary) hypertension: Secondary | ICD-10-CM

## 2013-11-21 DIAGNOSIS — G9001 Carotid sinus syncope: Secondary | ICD-10-CM

## 2013-11-21 MED ORDER — NADOLOL 20 MG OR TABS
20.0000 mg | ORAL_TABLET | Freq: Every day | ORAL | Status: DC | PRN
Start: 2013-11-21 — End: 2014-05-24

## 2013-11-21 NOTE — Patient Instructions (Signed)
No changes for now  Repeat cholesterol

## 2013-11-23 NOTE — Progress Notes (Signed)
Cardiovascular Clinical Evaluation Note    Primary Care Provider: Shann MedalKraft, Vara Viseskul    Referring Provider: No ref. provider found    DOB:  07/30/1951    CHIEF COMPLAINT:  Hyper and hypotension    HISTORY OF PRESENT ILLNESS:  Patient reports overall good control of blood pressures.  They correlate very well with his pain.  He has chronic pain from multiple sites.  He is undergone ablation of nerve roots from C3 to CVA and his neck pain is a monster probably better but he has a lot of low back pain now and both legs are affected.  In the mornings when he has most of his pain blood pressures tend to be elevated and then dropped during the course of the day.  He has had syncope due to hypotension with attempts to more aggressively manage the pain related elevations in blood pressure.  He denies any chest pain or shortness of breath.  He has no syncope or presyncope.  There is no history of palpitations.      PROBLEM LIST:  Patient Active Problem List    Diagnosis Date Noted   . CERVICALGIA [723.1] 03/11/2010   . Syncope [780.2]    . HYPOTENSION  [458.9]    . HYPERTENSION  [401.9]    . 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 URI (upper respiratory infection) [465.9]    . Tremor [781.0] 04/20/2013   . Lumbosacral radiculopathy at S1 [724.4] 03/22/2013     Left     . Fall [E888.9] 02/23/2013     BP < 100  Occasioanal tri[p and leg weakness     . Essential tremor [333.1] 02/15/2013   . Back pain, lumbosacral [724.2, 724.6] 02/15/2013   . Lumbar radiculopathy, chronic [724.4] 02/15/2013   . Neck pain [723.1] 02/15/2013   . Chronic pain [338.29] 12/21/2012     Now at [facility], DO, Santiago Gladebecca Capen    Neck pain -burn c3-7 on left side per patient  Some trigger injection    Lumbar pain-since 80's better with exercise     . 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 Numbness and tingling of leg [782.0] 12/21/2012     Left calf -left foot long term suspect from back-suspect L 45     . Chronic back pain  [724.5, 338.29] 12/21/2012     Mri in mid scape 1/214  Multilevel degenerative disc disease of the lumbar spine. Circumferential disc bulge at all levels below and including L1-2. No significant central spinal canal stenosis or neuroforaminal narrowing.  2. Mild retrolisthesis of L5 on S1.           . B12 deficiency [266.2] 12/21/2012     Borderline low 5 /21/2014     . Chronic renal insufficiency, stage III (moderate) [585.3] 12/21/2012     5/ 2013     . Hyperlipidemia [272.4] 09/26/2012       MEDICATIONS:    Current Outpatient Prescriptions   Medication Sig Dispense Refill   . Cetirizine HCl 10 MG Oral Tab PRN bee sting       . Cyanocobalamin 1000 MCG Oral Tab one per day over-the-counter started 5/21 /2012 this level borderline low  1 Tab  1   . Diclofenac Sodium (VOLTAREN) 1 % Transdermal Gel Apply once daily to neck, trapezius muscle, and low back  1 Tube  5   . EPINEPHrine 0.3 MG/0.3ML Injection Solution Auto-injector Inject as instructed per patient  package insert, 0.3 mg intramuscularly or subcutaneously into the thigh, if needed to treat anaphylaxis  3 Device  1   . Hydrochlorothiazide 12.5 MG Oral Tab None Entered       . Hydrocodone-Acetaminophen 5-325 MG Oral Tab Take 2 tablets by mouth 2 times a day.  60 tablet  0   . Lisinopril (PRINIVIL) 20 MG Oral Tab Take 1 tablet (20 mg) by mouth every 12 hours.  180 tablet  3   . Nadolol 20 MG Oral Tab Take 1 tablet (20 mg) by mouth daily as needed (tremor).  30 tablet  3   . Nadolol 40 MG Oral Tab Take 1 tablet (40 mg) by mouth daily.  90 tablet  3     No current facility-administered medications for this visit.       ALLERGIES:  Bee venom and Lidocaine      PSFH: He is being followed by neurology for tremor and neuropathy of the lower extremities probably related to his lumbosacral spine disease.  He has stopped drinking coffee.  Family history is positive for mother with an MI in her mid 7470s.  His lipid profile obtained last May showed a total cholesterol of  308 with LDL of 149.    REVIEW OF SYSTEMS:  See Cardiovascular HPI for pertinent positives and negatives.   RESPIRATORY:  No sputum, hemoptysis.    PHYSICAL EXAM    CONSTITUTIONAL:  This is a well-nourished male in no distress.   BP 164/110   Pulse 68   Ht 6\' 4"  (1.93 m)   Wt 214 lb 6.4 oz (97.251 kg)   BMI 26.11 kg/m2     NECK:  Jugular venous pressure normal.  Thyroid is not enlarged.  RESPIRATORY:   Clear to percussion and auscultation.  CARDIOVASCULAR:  Regular rhythm.  S1 and S2 normal. No S3, S4, no murmur.  Carotid upstrokes are brisk without bruits.   EXTREMITIES:  No clubbing, cyanosis, edema or tenderness noted.      IMPRESSION :   1.  Hypertension: Most of his elevated pressures are related to pain .  Pressure drops coincidently with pain relief.  2.  Hypotension with syncope, no recurrence  3.  Diffuse spinal disease  4.  Lower extremity neuropathy  5.  Hyperlipidemia    PLAN:    He is to continue to monitor blood pressure.  I think we need to address the lipid situation as well.  I want him to have repeat labs obtained.  He does have a family history of ischemic heart disease and last years labs are clearly out of line with recommendations.    Electronically signed by Ronnie Cravenaniel V Alasia Enge JR, MD    11/23/2013  9:35 AM    CC:   Judi SaaKraft, Vara Viseskul  No ref. provider found

## 2013-12-11 ENCOUNTER — Telehealth (INDEPENDENT_AMBULATORY_CARE_PROVIDER_SITE_OTHER): Payer: Self-pay | Admitting: Internal Medicine

## 2013-12-11 DIAGNOSIS — E785 Hyperlipidemia, unspecified: Secondary | ICD-10-CM

## 2013-12-11 DIAGNOSIS — I10 Essential (primary) hypertension: Secondary | ICD-10-CM

## 2013-12-11 NOTE — Telephone Encounter (Signed)
Please see me after lab  Labs are ordered

## 2013-12-11 NOTE — Telephone Encounter (Signed)
CONFIRMED PHONE NUMBER:[phone]  CALLERS FIRST AND LAST NAME: Aleda GranaDavid Paul Delaughter    FACILITY NAME: na TITLE: na  CALLERS RELATIONSHIP:Self  RETURN CALL: Detailed message on voicemail only     SUBJECT: General Message   REASON FOR REQUEST: Patient was in to see his cardiologist last week and was asked to request an order for labwork for cholesterol work up , from his PCP, Dr Marisa SprinklesKraft.   Please phone to advise.     MESSAGE: see above

## 2013-12-12 NOTE — Telephone Encounter (Signed)
Patient notified, will schedule an appointment after he gets labs done.

## 2013-12-14 LAB — COMPREHENSIVE METABOLIC PANEL
ALT (GPT): 30 U/L (ref 10–48)
AST (GOT): 25 U/L (ref 9–38)
Albumin: 4.4 g/dL (ref 3.5–5.2)
Alkaline Phosphatase (Total): 68 U/L (ref 37–159)
Anion Gap: 5 (ref 4–12)
Bilirubin (Total): 0.6 mg/dL (ref 0.2–1.3)
Calcium: 9.7 mg/dL (ref 8.9–10.2)
Carbon Dioxide, Total: 30 mEq/L (ref 22–32)
Chloride: 104 mEq/L (ref 98–108)
Creatinine: 1.09 mg/dL (ref 0.51–1.18)
GFR, Calc, African American: >60 mL/min (ref >59)
GFR, Calc, European American: >60 mL/min (ref >59)
Glucose: 119 mg/dL (ref 62–125)
Potassium: 4.8 mEq/L (ref 3.6–5.2)
Protein (Total): 7.2 g/dL (ref 6.0–8.2)
Sodium: 139 mEq/L (ref 135–145)
Urea Nitrogen: 16 mg/dL (ref 8–21)

## 2013-12-14 LAB — LIPID PANEL
Cholesterol (LDL): 113 mg/dL (ref <130)
Cholesterol/HDL Ratio: 2.4
HDL Cholesterol: 100 mg/dL (ref >40)
Non-HDL Cholesterol: 140 mg/dL (ref 0–159)
Total Cholesterol: 240 mg/dL — ABNORMAL HIGH (ref <200)
Triglyceride: 135 mg/dL (ref <150)

## 2013-12-14 LAB — MAGNESIUM: Magnesium: 1.6 mg/dL — ABNORMAL LOW (ref 1.8–2.4)

## 2013-12-14 NOTE — Telephone Encounter (Signed)
Quick Note:    Your labs are as noted  Please make sure you follow up on this with me in clinic  Your magnesium is a little bit on the low side and this could be a function of your diuretic  So you can take magnesium oxide 400 mg per day and this is over-the-counter  But it can give you diarrhea    Please see me in clinic to further discuss your kidney function tests look stable  Your lipids are as noted  Telephone on 12/11/13    1. COMPREHENSIVE METABOLIC PANEL   Sodium mEq/L 098139 Ref Range: 135 - 145 mEq/L   Potassium mEq/L 4.8 Ref Range: 3.6 - 5.2 mEq/L   Chloride mEq/L 104 Ref Range: 98 - 108 mEq/L   Carbon Dioxide, Total mEq/L 30 Ref Range: 22 - 32 mEq/L   Anion Gap 5 Ref Range: 4 - 12   Glucose mg/dL 119119 Ref Range: 62 - 147125 mg/dL   Urea Nitrogen mg/dL 16 Ref Range: 8 - 21 mg/dL   Creatinine mg/dL 8.291.09 Ref Range: 5.620.51 - 1.18 mg/dL   Protein (Total) g/dL 7.2 Ref Range: 6.0 - 8.2 g/dL   Albumin g/dL 4.4 Ref Range: 3.5 - 5.2 g/dL   Bilirubin (Total) mg/dL 0.6 Ref Range: 0.2 - 1.3 mg/dL   Calcium mg/dL 9.7 Ref Range: 8.9 - 13.010.2 mg/dL   AST (GOT) U/L 25 Ref Range: 9 - 38 U/L   Alkaline Phosphatase (Total) U/L 68 Ref Range: 37 - 159 U/L   ALT (GPT) U/L 30 Ref Range: 10 - 48 U/L   Gfr, Calc, European American mL/min >60 Ref Range: >59 mL/min   GFR, Calc, African American mL/min >60 Ref Range: >59 mL/min   GFR, Information     2. LIPID PANEL   Cholesterol (Total) mg/dL 865240 (*) Ref Range: <784<200 mg/dL   Triglyceride mg/dL 696135 Ref Range: <295<150 mg/dL   Cholesterol (HDL) mg/dL 284100 Ref Range: >13>40 mg/dL   Cholesterol (LDL) mg/dL 244113 Ref Range: <010<130 mg/dL   Non-HDL Cholesterol mg/dL 272140 Ref Range: 0 - 536159 mg/dL   Cholesterol/HDL Ratio 2.4    Lipid Panel, Additional Info. (NOTE)     3. MAGNESIUM   Magnesium mg/dL 1.6 (*) Ref Range: 1.8 - 2.4 mg/dL    ______

## 2013-12-15 ENCOUNTER — Ambulatory Visit (HOSPITAL_BASED_OUTPATIENT_CLINIC_OR_DEPARTMENT_OTHER): Payer: No Typology Code available for payment source | Attending: Anesthesiology | Admitting: Anesthesiology

## 2013-12-15 ENCOUNTER — Encounter (HOSPITAL_BASED_OUTPATIENT_CLINIC_OR_DEPARTMENT_OTHER): Payer: Self-pay | Admitting: Anesthesiology

## 2013-12-15 VITALS — BP 163/113 | HR 64 | Temp 97.5°F | Resp 18 | Ht 76.0 in | Wt 215.4 lb

## 2013-12-15 DIAGNOSIS — M549 Dorsalgia, unspecified: Secondary | ICD-10-CM | POA: Insufficient documentation

## 2013-12-15 MED ORDER — HYDROCODONE-ACETAMINOPHEN 5-325 MG OR TABS
1.0000 | ORAL_TABLET | Freq: Four times a day (QID) | ORAL | Status: DC | PRN
Start: 2014-02-14 — End: 2014-02-14

## 2013-12-15 MED ORDER — HYDROCODONE-ACETAMINOPHEN 5-325 MG OR TABS
1.0000 | ORAL_TABLET | Freq: Four times a day (QID) | ORAL | Status: AC | PRN
Start: 2014-01-15 — End: 2014-02-14

## 2013-12-15 MED ORDER — HYDROCODONE-ACETAMINOPHEN 5-325 MG OR TABS
1.0000 | ORAL_TABLET | Freq: Four times a day (QID) | ORAL | Status: DC | PRN
Start: 2014-03-17 — End: 2013-12-15

## 2013-12-15 MED ORDER — HYDROCODONE-ACETAMINOPHEN 5-325 MG OR TABS
1.0000 | ORAL_TABLET | Freq: Every day | ORAL | Status: AC | PRN
Start: 2013-12-15 — End: 2014-01-15

## 2013-12-15 NOTE — Patient Instructions (Signed)
   Good luck with EMG study. If you are interested in surgery [facility]   Talk about gabapentin with Dr. Marisa SprinklesKraft for neuropathic pain   I will refill your Vicodin for these next three months   Follow up with me in three months

## 2013-12-15 NOTE — Progress Notes (Signed)
HISTORY OF PRESENT ILLNESS:   [name] is a 63 year old gentleman who returns today for a follow up of his chronic neck and low back pain. He responded very well to upper back/cervical IMS and now would like lumbar IMS. He has noticed over the past 6 months increasing right leg numbness. He has had stable left leg numbness on the lateral aspect of his lower leg, fifth toe and bottom of his foot for about 30 years . He now notices increasing numbness on the bottom of his right foot and lateral aspect of his fifth toe on the right. He denies having shooting pains or electrical sensations down his legs. No loss of bowl or bladder function. He also notices increasing lower back pain. No fevers. His neurologist recommended obtaining an EMG study to determine the etiology of his leg numbness. He is also interested in being evaluated by a neurosurgeon as he feels that if things keep getting worse, he would like surgery.  Since starting his IMS treatments, he has been able to be much more active. He recently had his annual office camping trip which he puts on at his house. 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   He has been taking on average 1 tab of 5mg  Vicodin per day. He has been stable on this dose, and tolerating well.     Nerve conduction study scheduled for next month for his leg numbness. Over past 4-5 months the numbness has increased in his right foot.         Past interventions   12/27/12 L5-S1 interlaminar epidural steroid injection under fluoroscopic guidance. Relief lasted only 2 weeks  08/31/2012 Left L5 TFESI 6 days of 100% pain relief of left sided low back and lower extremity pain with now almost back to baseline pain   03/30/2012 Left L4/5, L5/S1 facet RFA - not much effect  02/10/2012 Left C6/7 facet RFA   07/03/2011 Left L4/5, L5/S1 facet RFA   05/12/2011 Left C4/5, C5/6 facet RFA    several trigger point injections/IMS in office     Past medical/surgical history, social history, family history reviewed from previous notes. No changes.   Outpatient Prescriptions Prior to Visit   Medication Sig Dispense Refill   . Cetirizine HCl 10 MG Oral Tab PRN bee sting       . Cyanocobalamin 1000 MCG Oral Tab one per day over-the-counter started 5/21 /2012 this level borderline low  1 Tab  1   . Diclofenac Sodium (VOLTAREN) 1 % Transdermal Gel Apply once daily to neck, trapezius muscle, and low back  1 Tube  5   . EPINEPHrine 0.3 MG/0.3ML Injection Solution Auto-injector Inject as instructed per patient package insert, 0.3 mg intramuscularly or subcutaneously into the thigh, if needed to treat anaphylaxis  3 Device  1   . Hydrochlorothiazide 12.5 MG Oral Tab None Entered       . Hydrocodone-Acetaminophen 5-325 MG Oral Tab Take 2 tablets by mouth 2 times a day.  60 tablet  0   . Lisinopril (PRINIVIL) 20 MG Oral Tab Take 1 tablet (20 mg) by mouth every 12 hours.  180 tablet  3   . Nadolol 20 MG Oral Tab Take 1 tablet (20 mg) by mouth daily as needed (tremor).  30 tablet  3   . Nadolol 40 MG Oral Tab Take 1 tablet (40  mg) by mouth daily.  90 tablet  3     No facility-administered medications prior to visit.     ROS   A complete ROS was performed and was negative except those items listed in the HPI   ALLERGIES:   THE PATIENT DID HAVE A REACTION TO THE LIDODERM PATCH THAT WAS PRESCRIBED AT HIS LAST VISIT. HE REPORTS ITCHING AND SKIN REACTION ON HIS TORSO AND NECK WELL BEYOND THE BORDERS OF THE PATCH. ONCE HE REMOVED THE PATCH THIS IMPROVED. He has no allergic reaction to lidocaine alone.   PHYSICAL EXAMINATION:   GENERAL: The patient is alert and oriented, pleasant and cooperative.   HEENT: Normocephalic, atraumatic. Anicteric sclerae.   LUNGS: No increased work of breathing. No accessory muscle use.   CARDIAC: Warm extremities times 4. No vasomotor instability.   ABDOMEN: Nontender, nondistended.    MUSCULOSKELETAL:   Straight leg raise is negative bilaterally for radicular pain. He does however have very tight hamstrings.  Gait is non-antalgic. Multiple tenderpoints over lumbar spine, no piriformis tenderness, tender points over greater trochanter on the right. Very tight quadratus lumborum on the right.   NEUROLOGIC: 1+ bilateral achilles and patellar reflexes, decreased sensation to light touch over S1 dermatome on the left, decreased sensation to light touch over plantar aspect of right foot and lateral 5th toe, normal over calf.     IMAGING:   MRI of the lumbar spine from August 19, 2012. 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 There was circumferential disk bulge at all levels that was most pronounced at L3-L4 and L5-S1. There is narrowing of L4-L5 and L5-S1 bilateral neural foramina, but no clear evidence of impingement on the exiting nerve roots. There is also mild retrolisthesis of L5 on S1.     ASSESSMENT:   [name] is a 63 year old gentleman with known chronic neck and low back pain s/ lumbar and cervcial facet RFA and lumbar ESI. 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 His MRI done January of this year did show narrowing at bilateral L4-5 and L5-S1 neural foramina.    PLAN:   1. Medications: He still has 16 pills of Vicodin 5-325. He will call when he needs a refill  2. New onset right sided foot numbness: An EMG study will help clarify the etiology of his new onset right sided symptoms. He will contact his neurologist to schedule an appointment. He can then revisit the possibility of a neurosurgical consult. He is not having any red-flag symptoms and at this time, and so I will not order a repeat MRI. However if his symptoms continue to get worse, a new MRI may be called for.  2. Repeat IMS today, please see procedure note below      DIAGNOSIS: Myofacial Pain Syndrome   DESCRIPTION OF PROCEDURE: The procedure risks, hazards  and alternatives were discussed with the patient and a proper consent was obtained. The area over the myofascial spasm was prepped with alcohol utilizing sterile technique. After isolating it between two palpating fingertips a 30-gauge accupuncture needle was placed in the center of the myofascial spasms. The patient tolerated the procedure well without any apparent difficulties or complications.   Muscles treated   1. Bilateral quadratus lumborum  2. Bilateral lumbar paraspinal musculature  3. Right hamstrings  4. Right external hip rotators

## 2013-12-15 NOTE — Progress Notes (Signed)
HISTORY OF PRESENT ILLNESS:   [name] is a 63 year old gentleman who returns today for a follow up of his chronic neck and low back pain. He has noticed over the past 6 months increasing right leg numbness. He has had stable left leg numbness on the lateral aspect of his lower leg, fifth toe and bottom of his foot for about 30 years . He now notices increasing numbness on the bottom of his right foot and lateral aspect of his fifth toe on the right. He denies having shooting pains or electrical sensations down his legs. No loss of bowl or bladder function. He also notices increasing lower back pain. No fevers. His neurologist recommended obtaining an EMG study to determine the etiology of his leg numbness which he is scheduled to have soon. He is also interested in potentially being evaluated by a neurosurgeon depending on the results of his EMG.  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  He has been taking on average 1 tab of 5mg  Vicodin per day. He has been stable on this dose, and tolerating well     Past interventions   12/27/12 L5-S1 interlaminar epidural steroid injection under fluoroscopic guidance. Relief lasted only 2 weeks   08/31/2012 Left L5 TFESI 6 days of 100% pain relief of left sided low back and lower extremity pain with now almost back to baseline pain   03/30/2012 Left L4/5, L5/S1 facet RFA   02/10/2012 Left C6/7 facet RFA   07/03/2011 Left L4/5, L5/S1 facet RFA   05/12/2011 Left C4/5, C5/6 facet RFA   several trigger point injections/IMS in office which only gives him temporary relief     Past medical/surgical history, social history, family history reviewed from previous notes. No changes.   Outpatient Prescriptions Prior to Visit   Medication Sig Dispense Refill   . Cetirizine HCl 10 MG Oral  Tab PRN bee sting       . Cyanocobalamin 1000 MCG Oral Tab one per day over-the-counter started 5/21 /2012 this level borderline low  1 Tab  1   . Diclofenac Sodium (VOLTAREN) 1 % Transdermal Gel Apply once daily to neck, trapezius muscle, and low back  1 Tube  5   . EPINEPHrine 0.3 MG/0.3ML Injection Solution Auto-injector Inject as instructed per patient package insert, 0.3 mg intramuscularly or subcutaneously into the thigh, if needed to treat anaphylaxis  3 Device  1   . Hydrochlorothiazide 12.5 MG Oral Tab None Entered       . Hydrocodone-Acetaminophen 5-325 MG Oral Tab Take 2 tablets by mouth 2 times a day.  60 tablet  0   . Lisinopril (PRINIVIL) 20 MG Oral Tab Take 1 tablet (20 mg) by mouth every 12 hours.  180 tablet  3   . Nadolol 20 MG Oral Tab Take 1 tablet (20 mg) by mouth daily as needed (tremor).  30 tablet  3   . Nadolol 40 MG Oral Tab Take 1 tablet (40 mg) by mouth daily.  90 tablet  3     No facility-administered medications prior to visit.     ROS   A complete ROS was performed and was negative except those items listed in the HPI   ALLERGIES:   THE PATIENT [facility]  [facility] AT HIS LAST VISIT. HE REPORTS ITCHING AND SKIN REACTION ON HIS TORSO AND NECK WELL BEYOND THE BORDERS OF THE PATCH. ONCE HE REMOVED THE PATCH THIS IMPROVED. He has no allergic reaction to lidocaine alone.   PHYSICAL EXAMINATION:   GENERAL: The patient is alert and oriented, pleasant and cooperative.   HEENT: Normocephalic, atraumatic. Anicteric sclerae.   LUNGS: No increased work of breathing. No accessory muscle use.   CARDIAC: Warm extremities times 4. No vasomotor instability.   ABDOMEN: Nontender, nondistended.   MUSCULOSKELETAL:   Normal gait    IMAGING:   MRI of the lumbar spine from August 19, 2012. 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 There was circumferential disk bulge at all levels that was most pronounced at L3-L4 and L5-S1. There is  narrowing of L4-L5 and L5-S1 bilateral neural foramina, but no clear evidence of impingement on the exiting nerve roots. There is also mild retrolisthesis of L5 on S1.     ASSESSMENT:   [name] is a 63 year old gentleman with known chronic neck and low back pain s/ lumbar and cervcial facet RFA and lumbar ESI. 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 His MRI done January of this year did show narrowing at bilateral L4-5 and L5-S1 neural foramina. He will be getting an EMG to evaluate the etiology of his right leg symptoms    PLAN:    Medications: He takes Vicodin 5-325 one daily. I wrote for three months supply to last until March 17 2014. During his next appointment we will talk about switching to tramadol as this medications works on multiple receptors.   I recommend that he talk with his PCP about starting gabapentin   Right sided foot numbness: An EMG study will help clarify the etiology of his new onset right sided symptoms. He will contact his neurologist to schedule an appointment. He can then revisit the possibility of a neurosurgical consult. He is not having any red-flag symptoms and at this time, and so I will not order a repeat MRI. However if his symptoms continue to get worse, a new MRI may be called for.

## 2013-12-27 ENCOUNTER — Encounter (INDEPENDENT_AMBULATORY_CARE_PROVIDER_SITE_OTHER): Payer: Self-pay | Admitting: Neurology

## 2013-12-27 ENCOUNTER — Ambulatory Visit (INDEPENDENT_AMBULATORY_CARE_PROVIDER_SITE_OTHER): Payer: No Typology Code available for payment source | Admitting: Neurology

## 2013-12-27 VITALS — BP 155/105 | HR 64 | Ht 76.0 in | Wt 215.0 lb

## 2013-12-27 DIAGNOSIS — G5702 Lesion of sciatic nerve, left lower limb: Secondary | ICD-10-CM

## 2013-12-27 DIAGNOSIS — M545 Low back pain, unspecified: Secondary | ICD-10-CM

## 2013-12-27 DIAGNOSIS — G5730 Lesion of lateral popliteal nerve, unspecified lower limb: Secondary | ICD-10-CM

## 2013-12-27 DIAGNOSIS — IMO0002 Reserved for concepts with insufficient information to code with codable children: Secondary | ICD-10-CM

## 2013-12-27 DIAGNOSIS — G629 Polyneuropathy, unspecified: Secondary | ICD-10-CM

## 2013-12-27 DIAGNOSIS — G589 Mononeuropathy, unspecified: Secondary | ICD-10-CM

## 2013-12-27 DIAGNOSIS — M5417 Radiculopathy, lumbosacral region: Secondary | ICD-10-CM

## 2013-12-27 NOTE — Progress Notes (Signed)
He returns for follow up of tremor and symptoms of low back pain.    He has been getting PT for back discomfort.   Pain is dull and throbbing.  The symptoms are at their worst in the morning upon arising but improves with activity.  He treats his symptoms with Vicodin in the morning followed by a dose of naproxen in the early afternoon.    He reports numbness in the left leg that has been chronic since 1985 when he believes he herniated the disc at L4-5, perhaps while lifting weights or playing racket ball.    He began to have numbness in the small toe of the right foot gradually extended into the sole of the right foot last June.    He is having difficulty climbing stairs with leg fatigue but no limitation from walking on a level surface.    He also reports having to lift the left leg to get in and out of his car.    He has a prior history of neck discomfort for which he has "radiofrequency ablations" that gave him some relief in his neck.  In the low back this treatment was ineffective.    [name].  [facility].    His essential tremor is stable.    His follow up was delayed by his need to care for his wife who underwent knee replacement surgery.    This is a well developed male in no apparent distress.  BP 155/105  Pulse 64  Ht 6\' 4"  (1.93 m)  Wt 215 lb (97.523 kg)  BMI 26.18 kg/m2  Mental Status:  Alert and oriented, with intact attention, concentration, memory, language and fund of knowledge.  Cranial Nerves:  Oculomotor intact. No facial weakness. No hearing loss. No dysarthria.  Motor:  There is no weakness or atrophy in the extremities.  Foot muscles have normal power.  Reflexes are preserved except for the left ankle jerk.  There is no  incoordination in the limbs.  Gait is symmetrical. There is no postural instability.  Sensory: Distal hypoesthesia in the foot for pain and temperature in the S1 dermatome  bilaterally.    MRI scan of the lumbosacral spine from January 2014 of reviewed.  There is prominent degenerative disease of the disks and facet arthropathy.  There is no impingement on neural foramina or significant narrowing of the spinal canal.  At L3 4 on the left in the lateral recess there are features consistent with a small disc fragment.    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      ICD-9-CM    1. Lumbosacral radiculopathy at S1 724.4 MRI L SPINE WO CONTRAST     VITAMIN B12 (COBALAMIN)     SED RATE     CRP, HIGH SENSITIVITY     HEMOGLOBIN A1C, HPLC     PROTEIN ELECTR. REFLX PNL     IMMUNOFIXATION   2. Neuropathy 355.9 VITAMIN B12 (COBALAMIN)     SED RATE     CRP, HIGH SENSITIVITY     HEMOGLOBIN A1C, HPLC     PROTEIN ELECTR. REFLX PNL     IMMUNOFIXATION   3. 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  He is also reporting (possibly) neurogenic claudication.    He clearly has a sensory axonopathy-this cannot be localized to the spinal nerve roots.  Focal peripheral nerve disease (e.g. peroneal neuropathy) on the left should be considered as a likely cause.    His symptoms in the right lower extremity are of less certain origin (in the absence of nerve conduction abnormalities), but might conceivably be related to spinal column disease as well.  EMG testing of the right lower extremity was deferred due to time constraints.    His low back discomfort is most likely a consequence of facet arthropathy.  Symptoms are at their worst in the morning.  They do benefit from the use of naproxen.  I have recommended a twice a day dose of same though he is advised  to be cautious regarding potential gastrointestinal adverse effects.    Patient Instructions   I am referring you for another MRI scan of the lumbar spine since you have evidence of an abnormality at S1 on the left.    You also have evidence of a peripheral neuropathy.    You are referred for lab tests to investigate your neuropathy.    I recommend that you take Naproxen first thing in the morning and every 12 hours.    Total time of greater than 40 minutes was spent face-to-face with the patient, of which more than 50% was spent counseling and coordinating care  as outlined in this note in addition to EMG and nerve conduction study testing.        EMG and nerve conduction study    Nerve Conduction Studies  Anti Sensory Summary Table   Site NR Onset (ms) Norm Onset (ms) P-T Amp (V) Norm P-T Amp Site1 Site2 Delta-0 (ms) Dist (cm) Vel (m/s) Norm Vel (m/s)   Left Sup Peron Anti Sensory (Ant Lat Mall)  30C   14 cm *NR    >2 14 cm Ant Lat Mall  14.0  >32   Right Sup Peron Anti Sensory (Ant Lat Mall)  30C   14 cm    3.3  5.2 >2 14 cm Ant Lat Mall 3.3 14.0 42 >32   Left Sural Anti Sensory (Lat Mall)  30C    Averaged   Calf    3.7  *1.4 >3.5 Calf Lat Mall 3.7 14.0 38 >32   Right Sural Anti Sensory (Lat Mall)  30C    Averaged   Calf    3.8  5.3 >3.5 Calf Lat Mall 3.8 14.0 37 >32     Motor Summary Table   Site NR Onset (ms) Norm Onset (ms) O-P Amp (mV) Norm O-P Amp Site1 Site2 Delta-0 (ms) Dist (cm) Vel (m/s) Norm Vel (m/s)   Left Peroneal Motor (Ext Dig Brev)  30C   Ankle    5.1 <5.8 2.8 >1.5 B Fib Ankle 9.4 32.0 34 >34   B Fib    14.5  2.6  Poplt B Fib 2.9 10.0 34 >32   Poplt    17.4  3.1          Right Peroneal Motor (Ext Dig Brev)  30C   Ankle    5.2 <5.8 3.6 >1.5 B Fib Ankle 8.7 35.0 40 >34   B Fib    13.9  2.7  Poplt B Fib 2.3 10.0 43 >32   Poplt  16.2  2.6          Left Tibial Motor (Abd Hall Brev)  30C   Ankle    5.3 <5.8 11.2 >2 Knee Ankle 11.8 46.0 39 >34   Knee    17.1  8.3          Right Tibial Motor  (Abd Hall Brev)  30C   Ankle    4.8 <5.8 11.9 >2 Knee Ankle 13.2 47.0 36 >34   Knee    18.0  7.5            F Wave Studies   NR F-Lat (ms) Lat Norm (ms) L-R F-Lat (ms) L-R Lat Norm   Left Peroneal (Mrkrs) (EDB)  30C   *NR  <58.4  <5.1   Right Peroneal (Mrkrs) (EDB)  30C      *59.02 <58.4  <5.1   Left Tibial (Mrkrs) (Abd Hallucis)  30C      61.73 <63.4 0.00 <5.7   Right Tibial (Mrkrs) (Abd Hallucis)  30C      61.73 <63.4 0.00 <5.7     EMG   Side Muscle Nerve Root Ins Act Fibs Psw Amp Dur Poly Recrt Comment Fasciculations   Left Iliacus Femoral L2-3 Nml Nml Nml Nml Nml 0 Nml  0   Left VastusMed Femoral L2-4 Nml Nml Nml Nml Nml 0 *Reduced  0   Left BicepsFemS Sciatic L5-S1 Nml Nml Nml *Incr *Incr 0 Nml  0   Left AntTibialis Dp Br Peron L4-5 Nml Nml Nml Nml Nml 0 Nml  0   Left Gastroc Tibial S1-2 *Incr *1+ *2+ Nml Nml 0 Nml  0   Left Lumbo Parasp Up Rami L1-2 Nml Nml Nml         Left Lumbo Parasp Mid Rami L3-4 Nml Nml Nml         Left Lumbo Parasp Low Rami L5-S1 *Incr Nml *2+             Nerve Conduction Studies  Motor Left/Right Comparison   Site L Lat (ms) R Lat (ms) L-R Lat (ms) L Amp (mV) R Amp (mV) L-R Amp (%) Site1 Site2 L Vel (m/s) R Vel (m/s) L-R Vel (m/s)   Peroneal Motor (Ext Dig Brev)  30C   Ankle 5.1 5.2 0.1 2.8 3.6 22.2 B Fib Ankle 34 40 6   B Fib 14.5 13.9 0.6 2.6 2.7 3.7 Poplt B Fib 34 43 9   Poplt 17.4 16.2 1.2 3.1 2.6 16.1        Tibial Motor (Abd Hall Brev)  30C   Ankle 5.3 4.8 0.5 11.2 11.9 5.9 Knee Ankle 39 36 3   Knee 17.1 18.0 0.9 8.3 7.5 9.6          Anti Sensory Left/Right Comparison   Site L Lat (ms) R Lat (ms) L-R Lat (ms) L Amp (V) R Amp (V) L-R Amp (%) Site1 Site2 L Vel (m/s) R Vel (m/s) L-R Vel (m/s)   Sup Peron Anti Sensory (Ant Lat Mall)  30C   14 cm  3.3   5.2  14 cm Ant Lat Mall  42    Sural Anti Sensory (Lat Mall)  30C    Averaged   Calf 3.7 3.8 0.1 *1.4 5.3 73.6 Calf Lat Mall 38 37 1       NCV FINDINGS:  . Evaluation of the Left Sup Peron Anti Sensory nerve demonstrated no  response (14 cm).  . The Left Sural Anti Sensory nerve demonstrated  reduced amplitude and normal conduction velocity (Calf-Lat [place]).  . The Left Peroneal Motor and the Right Peroneal Motor nerves demonstrated normal distal onset latency, normal amplitude, normal conduction velocity (B Fib-Ankle), and normal conduction velocity (Poplt-B Fib).  . The Left Tibial Motor and the Right Tibial Motor nerves demonstrated normal distal onset latency, normal amplitude, and normal conduction velocity (Knee-Ankle).  . The Right Sup Peron Anti Sensory nerve demonstrated normal amplitude and normal conduction velocity (14 cm-Ant Lat Mall).  . The Right Sural Anti Sensory nerve demonstrated normal amplitude and normal conduction velocity (Calf-Lat Mall).  . F Wave studies indicate that the Left Peroneal F Wave has no response.  . The Right Peroneal F Wave has prolonged latency (59.02 ms).  . All remaining F Wave latencies were within normal limits.      EMG FINDINGS:  . Monopolar needle EMG of the Left VastusMed muscle demonstrated diminished recruitment.  . The Left BicepsFemS muscle demonstrated increased motor unit amplitude and increased  motor unit duration.  . The Left Gastrocnemius and the Left Lumbo Parasp Low muscles demonstrated increased insertional activity and moderately increased spontaneous activity.  . All remaining muscles (as indicated in the preceding table) showed no evidence of abnormal spontaneous activity, motor unit remodeling or myopathic changes.    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  There is also evidence of a focal left peroneal neuropathy at the fibular head.  The EMG is abnormal and diagnostic of a left S1 spinal neuropathy.  Clinical correlation is required.      ___________________________  Christean Grief, M.D., P.S.  Diplomate, Biomedical engineer of Psychiatry and Neurology.

## 2013-12-27 NOTE — Patient Instructions (Signed)
I am referring you for another MRI scan of the lumbar spine since you have evidence of an abnormality at S1 on the left.    You also have evidence of a peripheral neuropathy.    You are referred for lab tests to investigate.    I recommend that you take Naproxen first thing in the morning and every 12 hours.

## 2014-01-11 LAB — VITAMIN B12 (COBALAMIN): Vitamin B12 (Cobalamin): 509 pg/mL (ref 180–914)

## 2014-01-11 LAB — C_REACTIVE PROTEIN: C_Reactive Protein: 2.1 mg/L (ref 0.0–10.0)

## 2014-01-11 LAB — SED RATE: Erythrocyte Sedimentation Rate: 3 mm/hr (ref 0–15)

## 2014-01-12 LAB — PROTEIN ELECTR. REFLX PNL
Albumin: 4.4 g/dL (ref 3.5–4.9)
Alpha 1: 0.2 g/dL (ref 0.1–0.3)
Alpha 2: 0.8 g/dL (ref 0.3–0.8)
Beta: 0.8 g/dL (ref 0.6–1.0)
Electrophoresis Interp:: NORMAL
Gamma: 0.9 g/dL (ref 0.4–1.4)
Protein (Total): 7.1 g/dL (ref 6.0–8.2)

## 2014-01-12 LAB — IMMUNOFIXATION

## 2014-01-12 LAB — HEMOGLOBIN A1C, HPLC: Hemoglobin A1C: 5 % (ref 4.0–6.0)

## 2014-01-18 ENCOUNTER — Ambulatory Visit (INDEPENDENT_AMBULATORY_CARE_PROVIDER_SITE_OTHER): Payer: No Typology Code available for payment source | Admitting: Neurology

## 2014-01-18 ENCOUNTER — Encounter (INDEPENDENT_AMBULATORY_CARE_PROVIDER_SITE_OTHER): Payer: Self-pay | Admitting: Internal Medicine

## 2014-01-18 ENCOUNTER — Encounter (INDEPENDENT_AMBULATORY_CARE_PROVIDER_SITE_OTHER): Payer: Self-pay | Admitting: Neurology

## 2014-01-18 ENCOUNTER — Ambulatory Visit (INDEPENDENT_AMBULATORY_CARE_PROVIDER_SITE_OTHER): Payer: No Typology Code available for payment source | Admitting: Internal Medicine

## 2014-01-18 VITALS — BP 134/86 | HR 66 | Ht 76.0 in | Wt 215.2 lb

## 2014-01-18 VITALS — BP 160/100 | HR 67 | Ht 76.0 in | Wt 215.0 lb

## 2014-01-18 DIAGNOSIS — G8929 Other chronic pain: Secondary | ICD-10-CM

## 2014-01-18 DIAGNOSIS — E785 Hyperlipidemia, unspecified: Secondary | ICD-10-CM

## 2014-01-18 DIAGNOSIS — M549 Dorsalgia, unspecified: Secondary | ICD-10-CM

## 2014-01-18 DIAGNOSIS — M5417 Radiculopathy, lumbosacral region: Secondary | ICD-10-CM

## 2014-01-18 DIAGNOSIS — G629 Polyneuropathy, unspecified: Secondary | ICD-10-CM

## 2014-01-18 DIAGNOSIS — G609 Hereditary and idiopathic neuropathy, unspecified: Secondary | ICD-10-CM

## 2014-01-18 DIAGNOSIS — IMO0002 Reserved for concepts with insufficient information to code with codable children: Secondary | ICD-10-CM

## 2014-01-18 DIAGNOSIS — I10 Essential (primary) hypertension: Secondary | ICD-10-CM

## 2014-01-18 DIAGNOSIS — R251 Tremor, unspecified: Secondary | ICD-10-CM

## 2014-01-18 DIAGNOSIS — M542 Cervicalgia: Secondary | ICD-10-CM

## 2014-01-18 MED ORDER — ATORVASTATIN CALCIUM 10 MG OR TABS
10.0000 mg | ORAL_TABLET | Freq: Every day | ORAL | Status: DC
Start: 2014-01-18 — End: 2014-08-13

## 2014-01-18 NOTE — Progress Notes (Signed)
Ronnie Mason is a 63 year old male     Review of patient's allergies indicates:  Allergies   Allergen Reactions   . Bee Venom Hives, Itching and Swelling   . Lidocaine Rash     Lidocaine patch. PATIENT STATES HE IS ALLERGIC TO THE ADHESIVE NOT THE PATCH.       BP 134/86  Pulse 66  Ht 6\' 4"  (1.93 m)  Wt 215 lb 4 oz (97.637 kg)  BMI 26.21 kg/m2    Chief Complaint   Patient presents with   . Follow Up Visit     Discuss labs done with Dr. Aundria RudWilkinson and blood pressure       HPI      Patient is here to follow-up on his labs last set drawn noted  He is also being seen by Dr. Miguel DibbleKirschner  Also by the pain clinic  Last seen by me 5 2014  Chem normal 12/2013  Lipid  Tc 240  hdl 100   tg 135   ldl 113      Mom died of mi at 1276  Dad died of leukemia  Patient does not smoke    His left leg has always been bothering him with numbness and not feeling  In the last year he has had right leg affected  He has a follow-up with Dr. Miguel DibbleKirschner today to follow-up on studies  He has also a certain sense of discomfort in his left neck area    There is no muscle aches no nausea    He has not been able to exercise because of his nerve discomfort and chronic back pain    He is here today to review his cholesterol and blood pressure findings    It is noted he does take his nadolol every day at 40 mg  But when he has a hard day work he noticed his benign essential tremor escalating  And he would take an extra 20 mg twice a week  He tells me that Dr. Dahlia BailiffWilkinson okayed this      Office Visit on 12/27/13   VITAMIN B12 (COBALAMIN)       Result Value Ref Range    Vitamin B12 (Cobalamin) 509  180 - 914 pg/mL   SED RATE       Result Value Ref Range    Erythrocyte Sedimentation Rate 3  0 - 15 mm/hr   CRP, HIGH SENSITIVITY       Result Value Ref Range    CRP, High Sensitivity 2.1  0.0 - 10.0 mg/L   HEMOGLOBIN A1C, HPLC       Result Value Ref Range    Hemoglobin A1C by HPLC 5.0  4.0 - 6.0 %   PROTEIN ELECTR. REFLX PNL       Result Value Ref Range     Protein (Total) 7.1  6.0 - 8.2 g/dL    Albumin 4.4  3.5 - 4.9 g/dL    Alpha 1 0.2  0.1 - 0.3 g/dL    Alpha 2 0.8  0.3 - 0.8 g/dL    Beta 0.8  0.6 - 1.0 g/dL    Gamma 0.9  0.4 - 1.4 g/dL    Electrophoresis Interp: Normal pattern.  NORPAT   IMMUNOFIXATION       Result Value Ref Range    Immunofixation No monoclonal component observed.  [facility]         MEDICATION PRIOR TO VISIT  Reports -Complaint with medications   Outpatient  Prescriptions Prior to Visit   Medication Sig Dispense Refill   . Cetirizine HCl 10 MG Oral Tab PRN bee sting     . Cyanocobalamin 1000 MCG Oral Tab one per day over-the-counter started 5/21 /2012 this level borderline low 1 Tab 1   . Diclofenac Sodium (VOLTAREN) 1 % Transdermal Gel Apply once daily to neck, trapezius muscle, and low back 1 Tube 5   . EPINEPHrine 0.3 MG/0.3ML Injection Solution Auto-injector Inject as instructed per patient package insert, 0.3 mg intramuscularly or subcutaneously into the thigh, if needed to treat anaphylaxis 3 Device 1   . Hydrochlorothiazide 12.5 MG Oral Tab None Entered     . Hydrocodone-Acetaminophen 5-325 MG Oral Tab Take 1 tablet by mouth every 6 hours as needed for pain for up to 30 days. Do not fill before 01/15/14 30 tablet 0   . [START ON 02/14/2014] Hydrocodone-Acetaminophen 5-325 MG Oral Tab Take 1 tablet by mouth every 6 hours as needed for pain. Do not fill before 02/14/14 30 tablet 0   . Lisinopril (PRINIVIL) 20 MG Oral Tab Take 1 tablet (20 mg) by mouth every 12 hours. 180 tablet 3   . Nadolol 20 MG Oral Tab Take 1 tablet (20 mg) by mouth daily as needed (tremor). 30 tablet 3   . Nadolol 40 MG Oral Tab Take 1 tablet (40 mg) by mouth daily. 90 tablet 3     No facility-administered medications prior to visit.         REVIEW OF SYSTEMS - are otherwise negative unless as noted in HPI above or as additional issues below    Constitutional:     Eyes:    Head ears, nose mouth throat:     Cardiovascular:   Respiratory:     Gastrointestinal:   Genitourinary:       Musculoskeletal:    Integumentary:     Neurological:   Psychiatric/Behavioral:       Endocrine:   Hematological:   Allergic/Immunologic:      PHYSICAL EXAM    BP 134/86  Pulse 66  Ht 6\' 4"  (1.93 m)  Wt 215 lb 4 oz (97.637 kg)  BMI 26.21 kg/m2    Patient is oriented - to person place and time  Well Dressed  Pleasant Affect  Mood good  In general, patient looks well  Not in acute distress-Good coloring    Get up and go -no problem      Normal speech    Neck  Thyroid not enlarged  2+ carotid pulses no bruits  No neck or axillary adenopathy    Heart: Regular rate and rhythm. No murmurs    Lungs:  Clear to auscultation. No rales no rhonchi. No respiratory distress or stridor    Abdomen: soft. No masses. No guarding. No rebound    Extremities: no edema.     Neurological: non focal with no facial droop speech or gait above baseline  No gross obvious weakness  He does have essential tremor from finger to nose slight  His head is not bobbling today    IMPRESSION / PLAN / DISCUSSION     Won was seen today for follow up visit.    Diagnoses and associated orders for this visit:    Hyperlipidemia  - COMPREHENSIVE METABOLIC PANEL; Future  - MAGNESIUM; Future to be done in 2 months and to see me afterwards  - LIPID PANEL; Future  - Atorvastatin Calcium 10 MG Oral Tab; Take 1 tablet (10 mg) by  mouth daily.    Tremor    HYPERTENSION         I agree with Dr. Aundria RudWilkinson I think his blood pressure for the most part is well controlled  It is noted that Dr. Aundria RudWilkinson hazard he commented that his blood pressure may go up because of pain    Hence to treat the blood pressure would be to first treat the pain and he is in the process of doing this    When I look at his cholesterol numbers with primary prevention I probably would not have first put him on medication except for the fact that when we did his recent cardiac calculator his risk 10 years is 8.6%  For that reason I'm putting him on a moderate dose atorvastatin and side  effects of increasing body aches and fatigue and pain is discussed with patient    I will forward this note to Dr. Aundria RudWilkinson in case he wishes he does not feel he really needs to be on it  His mother had heart disease at an older age and she also smoked    As for his blood pressure medication it is noted that he does take an extra 20 mg twice a week when he feels his tremor escalating when he is tired   Patient is cautioned on this to make sure he does not have any syncope     He tells me he has not had any done in 3 years     Patient     express understanding of care plan/medications    MEDICATION as of Discharge  Current Outpatient Prescriptions   Medication Sig Dispense Refill   . Atorvastatin Calcium 10 MG Oral Tab Take 1 tablet (10 mg) by mouth daily. 90 tablet 1   . Cetirizine HCl 10 MG Oral Tab PRN bee sting     . Cyanocobalamin 1000 MCG Oral Tab one per day over-the-counter started 5/21 /2012 this level borderline low 1 Tab 1   . Diclofenac Sodium (VOLTAREN) 1 % Transdermal Gel Apply once daily to neck, trapezius muscle, and low back 1 Tube 5   . EPINEPHrine 0.3 MG/0.3ML Injection Solution Auto-injector Inject as instructed per patient package insert, 0.3 mg intramuscularly or subcutaneously into the thigh, if needed to treat anaphylaxis 3 Device 1   . Hydrochlorothiazide 12.5 MG Oral Tab None Entered     . Hydrocodone-Acetaminophen 5-325 MG Oral Tab Take 1 tablet by mouth every 6 hours as needed for pain for up to 30 days. Do not fill before 01/15/14 30 tablet 0   . [START ON 02/14/2014] Hydrocodone-Acetaminophen 5-325 MG Oral Tab Take 1 tablet by mouth every 6 hours as needed for pain. Do not fill before 02/14/14 30 tablet 0   . Lisinopril (PRINIVIL) 20 MG Oral Tab Take 1 tablet (20 mg) by mouth every 12 hours. 180 tablet 3   . Nadolol 20 MG Oral Tab Take 1 tablet (20 mg) by mouth daily as needed (tremor). 30 tablet 3   . Nadolol 40 MG Oral Tab Take 1 tablet (40 mg) by mouth daily. 90 tablet 3     No current  facility-administered medications for this visit.           Risk of taking medication properly and follow up discussed especially to call if problem    25  min face to face greater than 50 %  Spent in counsel and coordinating  Care  Return in about 3 months (around 04/20/2014)  for follow up on lipid.    Cc to following MD:            ++++++++++++++++++++++++++++++++++++++++++++++++++        Patient Active Problem List    Diagnosis Date Noted   . Tremor [781.0] 04/20/2013   . Lumbosacral radiculopathy at S1 [724.4] 03/22/2013     Left     . Fall [E888.9] 02/23/2013     BP < 100  Occasioanal tri[p and leg weakness     . Essential tremor [333.1] 02/15/2013   . Back pain, lumbosacral [724.2, 724.6] 02/15/2013   . Lumbar radiculopathy, chronic [724.4] 02/15/2013   . Neck pain [723.1] 02/15/2013   . Chronic pain [338.29] 12/21/2012     Now at [place], DO, Santiago Glad    Neck pain -burn c3-7 on left side per patient  Some trigger injection    Lumbar pain-since 80's better with exercise     . 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 Numbness and tingling of leg [782.0] 12/21/2012     Left calf -left foot long term suspect from back-suspect L 45     . Chronic back pain [724.5, 338.29] 12/21/2012     Mri in mid scape 1/214  Multilevel degenerative disc disease of the lumbar spine. Circumferential disc bulge at all levels below and including L1-2. No significant central spinal canal stenosis or neuroforaminal narrowing.  2. Mild retrolisthesis of L5 on S1.           . B12 deficiency [266.2] 12/21/2012     Borderline low 5 /21/2014     . Chronic renal insufficiency, stage III (moderate) [585.3] 12/21/2012     5/ 2013     . Hyperlipidemia [272.4] 09/26/2012   . Cervicalgia [723.1] 03/11/2010     Radiofrequency ablation of c3 to c 7     . Syncope [780.2]    . HYPOTENSION  [458.9]    . HYPERTENSION  [401.9]    . 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 URI (upper respiratory infection) [465.9]           Past Medical History   Diagnosis Date   . Syncope    . HYPOTENSION     . HYPERTENSION     . Carotid Sinus Hypersensitivity    . URI (upper respiratory infection)    . History of EKG 5/97   . Lipidemia    . Chronic kidney disease        History     Social History   . Marital Status: Married     Spouse Name: N/A     Number of Children: N/A   . Years of Education: N/A     Occupational History   . Not on file.     Social History Main Topics   . Smoking status: Never Smoker    . Smokeless tobacco: Never Used   . Alcohol Use: Yes   . Drug Use: No   . Sexual Activity: Not on file     Other Topics Concern   . Not on file     Social History Narrative         Family History   Problem Relation Age of Onset   . Colon Cancer Father    . Heart (other) Mother      MI   . Other Family Hx       myelodysplasia       History  Social History   . Marital Status: Married     Spouse Name: N/A     Number of Children: N/A   . Years of Education: N/A     Social History Main Topics   . Smoking status: Never Smoker    . Smokeless tobacco: Never Used   . Alcohol Use: Yes   . Drug Use: No   . Sexual Activity: Not on file     Other Topics Concern   . Not on file     Social History Narrative       Health Maintenance   Topic Date Due   . COLON CANCER SCREENING,FOBT/FIT  07/07/2002   . ZOSTER VACCINE  07/07/2012   . INFLUENZA >9 YRS  04/03/2014   . CHOLESTEROL- MALE  12/15/2018   . TETANUS BOOSTER  12/22/2022

## 2014-01-18 NOTE — Progress Notes (Signed)
He returns today for follow-up of his history of low back pain radiating into the left leg with associated findings on examination of hypoesthesia in the S1 dermatome, loss of the ankle reflex, and findings on EMG of active denervation.    Records [facility].  He recounts a history of chronic back and neck discomfort for which she has had extensive interventions by the [facility] pain management service.  He last had radiofrequency neurotomies performed in 2013 for management of his neck and low back discomfort.  He has also had epidural injections.  He has found these treatments to be beneficial.    He reports having had a couple of days of pain relief following his recent needle EMG study.    MRI scan of the lumbar sacral spine is reviewed today with him.  There is evidence of moderately severe disc degeneration at L5-S1 where there is also associated osteophyte causing S1 neural foramen stenosis.  This is evident on the affected left side and not on the right.  There is no central canal stenosis.  The radiologist did not observe stenosis on the left at S1.    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  He continues to experience "numbness" affecting the feet bilaterally.  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    Hemoglobin A1c, B12, serum protein electrophoresis with immunofixation were unremarkable.    He has concern today about the elevated blood pressures managed in this clinic.  From his personal experience he believes that the hand-held portable plethysmographs measure higher blood pressures when their batteries are low.  He is urging me to be sure that are devices are adequately calibrated.    This is a well-developed male in no acute distress.  BP 160/100  Pulse 67  Ht 6\' 4"  (1.93 m)  Wt 215 lb  (97.523 kg)  BMI 26.18 kg/m2  Mental Status:  Alert and oriented, with intact attention, concentration, memory, language and fund of knowledge.  Cranial Nerves:  Oculomotor intact. No facial weakness. No hearing loss. No dysarthria.  Motor:   There is no  incoordination in the limbs.  Gait is symmetrical. There is no postural instability.      ICD-9-CM    1. Lumbosacral radiculopathy at S1 724.4 REFERRAL TO INTERVENTIONAL RADIOLOGY   2. 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 Hypertension 401.9    4. Cervicalgia 723.1    5. Chronic back pain 724.5     338.29          I have recommended a left S1 spinal nerve blocking procedure by interventional radiology.  He has [facility] and I have arranged that for him.    Counseling also provided today regarding the significance of his length-dependent sensory neuropathy and indications for further, more aggressive investigation.  At this time, clinical follow-up is warranted.  Medication for the management of neuropathy related discomforts are available.  At this time he does not desire any treatment.      Patient Instructions   You are referred for a left S[facility] spinal nerve [facility].    Your lab tests do not identify a treatable cause of your foot numbness (neuropathy). This should be followed clinically.    Return for follow up 1 month after the injection.  Total time of greater than 40 minutes was spent face-to-face with the patient, of which more than 50% was spent counseling and coordinating care  as outlined in this note.

## 2014-01-18 NOTE — Progress Notes (Signed)
Does patient have 3 or more complaints:  NO  If yes, discuss with patient the need to schedule a follow up appointment due to limited appointment time.  Follow up appointment scheduled:  NO  Care Everywhere records available/ reconciled:  YES  PHQ2 complete:  NO  PHQ9 complete:  NO  Refills pended:  NO  Orders pended:  NO  Medications to discontinue:  YES  Cetrizine, HCTZ  Health Maintenance updated:  YES    Health Maintenance   Topic Date Due    COLON CANCER SCREENING,FOBT/FIT  07/07/2002    ZOSTER VACCINE  07/07/2012    INFLUENZA >9 YRS  04/03/2014    CHOLESTEROL- MALE  12/15/2018    TETANUS BOOSTER  12/22/2022

## 2014-01-18 NOTE — Patient Instructions (Addendum)
You are referred for a left S[facility] spinal nerve [facility].    Your lab tests do not identify a treatable cause of your foot numbness (neuropathy). This should be followed clinically.    Return for follow up 1 month after the injection.

## 2014-02-14 ENCOUNTER — Encounter (HOSPITAL_BASED_OUTPATIENT_CLINIC_OR_DEPARTMENT_OTHER): Payer: Self-pay | Admitting: Anesthesiology

## 2014-02-14 ENCOUNTER — Ambulatory Visit (HOSPITAL_BASED_OUTPATIENT_CLINIC_OR_DEPARTMENT_OTHER): Payer: No Typology Code available for payment source | Attending: Anesthesiology | Admitting: Anesthesiology

## 2014-02-14 VITALS — BP 162/112 | HR 60 | Temp 98.2°F | Resp 16 | Ht 76.0 in | Wt 215.0 lb

## 2014-02-14 DIAGNOSIS — M542 Cervicalgia: Secondary | ICD-10-CM

## 2014-02-14 DIAGNOSIS — G8929 Other chronic pain: Secondary | ICD-10-CM

## 2014-02-14 DIAGNOSIS — M545 Low back pain, unspecified: Secondary | ICD-10-CM | POA: Insufficient documentation

## 2014-02-14 MED ORDER — HYDROCODONE-ACETAMINOPHEN 5-325 MG OR TABS
1.0000 | ORAL_TABLET | Freq: Four times a day (QID) | ORAL | Status: DC | PRN
Start: 2014-03-17 — End: 2014-02-14

## 2014-02-14 MED ORDER — HYDROCODONE-ACETAMINOPHEN 5-325 MG OR TABS
1.0000 | ORAL_TABLET | Freq: Four times a day (QID) | ORAL | Status: DC | PRN
Start: 2014-04-17 — End: 2014-02-14

## 2014-02-14 MED ORDER — HYDROCODONE-ACETAMINOPHEN 5-325 MG OR TABS
1.0000 | ORAL_TABLET | Freq: Four times a day (QID) | ORAL | Status: DC | PRN
Start: 2014-05-17 — End: 2014-05-31

## 2014-02-14 NOTE — Progress Notes (Signed)
HISTORY OF PRESENT ILLNESS:   [name] is a 62 year old gentleman who returns today for a follow up of his chronic neck and low back pain.  He has had stable left leg numbness on the lateral aspect of his lower leg, fifth toe and bottom of his foot for about 30 years . He now notices increasing numbness on the bottom of his right foot and lateral aspect of his fifth toe on the right. He is being seen by Dr. Miguel Dibble at [facility] for this, as of now, idiopathic neuropathy. He denies having shooting pains or electrical sensations down his legs. No loss of bowl or bladder function. He also notices increasing lower back pain. No fevers.   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 He has been much more physically active since his retirement working on Psychologist, forensic and playing with his 8 month old grandson.  He has been taking on average 1 tab of 5mg  Vicodin per day. He has been stable on this dose, and tolerating well. He does note today that every once in a while he needs to take two Vicodin per day and so ends up a few days short at the end of the month.    Past interventions   12/27/12 L5-S1 interlaminar epidural steroid injection under fluoroscopic guidance. Relief lasted only 2 weeks   08/31/2012 Left L5 TFESI 6 days of 100% pain relief of left sided low back and lower extremity pain with now almost back to baseline pain   03/30/2012 Left L4/5, L5/S1 facet RFA   02/10/2012 Left C6/7 facet RFA   07/03/2011 Left L4/5, L5/S1 facet RFA   05/12/2011 Left C4/5, C5/6 facet RFA   several trigger point injections/IMS in office which only gives him temporary relief     Past medical/surgical history, social history, family history reviewed from previous notes. No changes.   Outpatient Prescriptions Prior to Visit   Medication Sig Dispense Refill   . Atorvastatin Calcium 10 MG Oral Tab Take 1 tablet (10 mg) by mouth daily. 90 tablet 1   . Cetirizine  HCl 10 MG Oral Tab PRN bee sting     . Cyanocobalamin 1000 MCG Oral Tab one per day over-the-counter started 5/21 /2012 this level borderline low 1 Tab 1   . Diclofenac Sodium (VOLTAREN) 1 % Transdermal Gel Apply once daily to neck, trapezius muscle, and low back 1 Tube 5   . EPINEPHrine 0.3 MG/0.3ML Injection Solution Auto-injector Inject as instructed per patient package insert, 0.3 mg intramuscularly or subcutaneously into the thigh, if needed to treat anaphylaxis 3 Device 1   . Hydrochlorothiazide 12.5 MG Oral Tab None Entered     . Hydrocodone-Acetaminophen 5-325 MG Oral Tab Take 1 tablet by mouth every 6 hours as needed for pain for up to 30 days. Do not fill before 01/15/14 30 tablet 0   . Hydrocodone-Acetaminophen 5-325 MG Oral Tab Take 1 tablet by mouth every 6 hours as needed for pain. Do not fill before 02/14/14 30 tablet 0   . Lisinopril (PRINIVIL) 20 MG Oral Tab Take 1 tablet (20 mg) by mouth every 12 hours. 180 tablet 3   . Nadolol 20 MG Oral Tab Take 1 tablet (20 mg) by mouth daily as needed (tremor). 30 tablet 3   . Nadolol 40 MG Oral Tab Take 1 tablet (40 mg) by mouth daily. 90 tablet 3     No facility-administered  medications prior to visit.     ROS   A complete ROS was performed and was negative except those items listed in the HPI   ALLERGIES:   THE PATIENT DID HAVE A REACTION TO THE LIDODERM PATCH THAT WAS PRESCRIBED AT HIS LAST VISIT. HE REPORTS ITCHING AND SKIN REACTION ON HIS TORSO AND NECK WELL BEYOND THE BORDERS OF THE PATCH. ONCE HE REMOVED THE PATCH THIS IMPROVED. He has no allergic reaction to lidocaine alone.     PHYSICAL EXAMINATION:   GENERAL: The patient is alert and oriented, pleasant and cooperative.   HEENT: Normocephalic, atraumatic. Anicteric sclerae.   LUNGS: No increased work of breathing. No accessory muscle use.   CARDIAC: Warm extremities times 4. No vasomotor instability.   ABDOMEN: Nontender, nondistended.   MUSCULOSKELETAL:   Normal gait, Bilateral st leg raise positive  for back pain only  NEURO: stable decreased sensation to touch on lateral aspect of      IMAGING:   MRI of the lumbar spine from August 19, 2012. 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 There was circumferential disk bulge at all levels that was most pronounced at L3-L4 and L5-S1. There is narrowing of L4-L5 and L5-S1 bilateral neural foramina, but no clear evidence of impingement on the exiting nerve roots. There is also mild retrolisthesis of L5 on S1    MRI of lumbar spine 01/04/14 offical read says Diffuse lumbar spondylosis without finding to explain left segmental S1 denervation. However Dr. Miguel Dibble from neurology states "There is evidence of moderately severe disc degeneration at L5-S1 where there is also associated osteophyte causing S1 neural foramen stenosis."  I do not have access to [facility] imaging as it does not pull into EPIC or [facility]CA, and so cannot review the images myself.    EMG performed by Dr. 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     ASSESSMENT:   Mr. [name] is a 63 year old gentleman with known chronic neck and low back pain s/p lumbar and cervcial facet RFA and lumbar ESI. 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 He is having EMG studies done with [facility], but I do not see the official results of those studies.    PLAN:   1. Back pain:   Medications: He takes Vicodin 5-325 one daily. I wrote for three months supply to last until May 17 2014. In the future, we could talk about switching to tramadol. However it also seems that neurology is perhaps interested in taking over his care for his back and leg pain. If that is the case, then Dr. Miguel Dibble or the pts  PCP can take over this Rx.   I recommend starting gabapentin for his back pain. As a rule, we do not write Rx for patients that we see in this clinic. I am writing his Vicodin as this is a historical medication that was started by a previous provider in our clinic. This is to decrease the risk of polypharmacy and to simplify medication obtaining for the patients.    Injections: At this point I do not feel that further epidural steroid injections are indicated. He is not having radicular symptoms or leg pain. He complains of leg fatigue and back pain both of  which are not amenable to treatment with steroids. The [facility] read [facility] states there is not foraminal stenosis, but Dr. Miguel DibbleKirschner feels that there is some stenosis at the left S1 level. I will contact [facility] and have them send the imaging over and obtain an official read from our radiologists and I will review the imaging myself. However epidural steroids have been found to be ineffective treatment for spinal stenosis (Friedly JL, [place] BA, Maryruth Bunurner JA, et al.. A randomized trial of epidural glucocorticoid injections for spinal stenosis. N Engl J Med. 2014;371(1):11-21.)    2. Idiopathic neuropathy   He is not having any pain associated with the neuropathy as of now. Continue to monitor the progression of the neuropathy and treat clinically as needed.   I will contact Dr. Miguel DibbleKirschner about the results of his EMG as I can not seem to locate them in EPIC. I am not clear if he only had the left side done, or if the right was completed as well.    Of note, the patient has had consistently elevated blood pressures when visiting our clinic. His chronic low back pain level is a 3/10 and is not the etiology of his HTN. I reviewed his cardiology note from 4/15 and this was stated as the etiology of his HTN which is why I am addressing this.

## 2014-02-14 NOTE — Patient Instructions (Addendum)
-   I would recommend starting gabapentin. Please discuss with PCP  - Follow up with me 1 month after injection  - I will refill RX for hydrocodone

## 2014-02-19 ENCOUNTER — Other Ambulatory Visit (INDEPENDENT_AMBULATORY_CARE_PROVIDER_SITE_OTHER): Payer: Self-pay | Admitting: Internal Medicine

## 2014-02-19 DIAGNOSIS — Z91030 Bee allergy status: Secondary | ICD-10-CM

## 2014-02-19 DIAGNOSIS — M542 Cervicalgia: Secondary | ICD-10-CM

## 2014-02-19 NOTE — Telephone Encounter (Signed)
Refill Request    Last visit: 01/18/2014  Next visit: Visit date not found  Last refill: 12/21/2012  Last Prescribed by: Marisa SprinklesKraft  Labs:               If Applicable:    lastlab[papdiag:1]      Outpatient Prescriptions Prior to Visit   Medication Sig Dispense Refill    Atorvastatin Calcium 10 MG Oral Tab Take 1 tablet (10 mg) by mouth daily. 90 tablet 1    Cetirizine HCl 10 MG Oral Tab PRN bee sting      Cyanocobalamin 1000 MCG Oral Tab one per day over-the-counter started 5/21 /2012 this level borderline low 1 Tab 1    Diclofenac Sodium (VOLTAREN) 1 % Transdermal Gel Apply once daily to neck, trapezius muscle, and low back 1 Tube 5    EPINEPHrine 0.3 MG/0.3ML Injection Solution Auto-injector Inject as instructed per patient package insert, 0.3 mg intramuscularly or subcutaneously into the thigh, if needed to treat anaphylaxis 3 Device 1    Hydrochlorothiazide 12.5 MG Oral Tab None Entered      [START ON 05/17/2014] Hydrocodone-Acetaminophen 5-325 MG Oral Tab Take 1 tablet by mouth every 6 hours as needed for pain. Do not fill before 05/17/14 30 tablet 0    Lisinopril (PRINIVIL) 20 MG Oral Tab Take 1 tablet (20 mg) by mouth every 12 hours. 180 tablet 3    Nadolol 20 MG Oral Tab Take 1 tablet (20 mg) by mouth daily as needed (tremor). 30 tablet 3    Nadolol 40 MG Oral Tab Take 1 tablet (40 mg) by mouth daily. 90 tablet 3     No facility-administered medications prior to visit.

## 2014-02-19 NOTE — Telephone Encounter (Signed)
From: Ronnie Granaavid Paul Kavanaugh  To: Judi SaaKraft, Vara Viseskul  Sent: 02/19/2014 9:53 AM PDT  Subject: Medication Renewal Request    Original authorizing provider: Macario CarlsVara V Kraft, MD    Ronnie Mason would like a refill of the following medications:  EPINEPHrine 0.3 MG/0.3ML Injection Solution Auto-injector Macario Carls[Vara V Kraft, MD]    [id number] [address] [place] [phone]     Comment:  Please renew epi-pens due to current ones being outdated. Request 2 2-pack boxes.

## 2014-02-20 MED ORDER — EPINEPHRINE 0.3 MG/0.3ML IJ SOAJ
INTRAMUSCULAR | Status: DC
Start: 2014-02-20 — End: 2016-01-30

## 2014-02-22 ENCOUNTER — Encounter (INDEPENDENT_AMBULATORY_CARE_PROVIDER_SITE_OTHER): Payer: Self-pay | Admitting: Internal Medicine

## 2014-02-22 DIAGNOSIS — M545 Low back pain, unspecified: Secondary | ICD-10-CM

## 2014-02-22 MED ORDER — GABAPENTIN 300 MG OR CAPS
ORAL_CAPSULE | ORAL | Status: DC
Start: 2014-02-22 — End: 2014-05-11

## 2014-02-22 NOTE — Telephone Encounter (Signed)
Please call patient get him to follow-up with me in about 3-4 weeks on gabapentin back pain

## 2014-03-28 ENCOUNTER — Other Ambulatory Visit (INDEPENDENT_AMBULATORY_CARE_PROVIDER_SITE_OTHER): Payer: Self-pay | Admitting: Cardiovascular Disease

## 2014-03-28 DIAGNOSIS — R251 Tremor, unspecified: Secondary | ICD-10-CM

## 2014-03-28 DIAGNOSIS — G8929 Other chronic pain: Secondary | ICD-10-CM

## 2014-03-28 DIAGNOSIS — I10 Essential (primary) hypertension: Secondary | ICD-10-CM

## 2014-03-28 MED ORDER — NADOLOL 40 MG OR TABS
40.0000 mg | ORAL_TABLET | Freq: Every day | ORAL | Status: DC
Start: 2014-03-28 — End: 2015-03-04

## 2014-03-28 NOTE — Telephone Encounter (Signed)
Last seen by Dr. Aundria Rud 11/21/2013    Doctor recommends follow up in 6 months from the date above  Patient has no upcoming appointment scheduled

## 2014-04-28 ENCOUNTER — Other Ambulatory Visit (INDEPENDENT_AMBULATORY_CARE_PROVIDER_SITE_OTHER): Payer: Self-pay | Admitting: Cardiovascular Disease

## 2014-04-28 DIAGNOSIS — I10 Essential (primary) hypertension: Secondary | ICD-10-CM

## 2014-04-30 MED ORDER — LISINOPRIL 40 MG OR TABS
40.0000 mg | ORAL_TABLET | Freq: Every day | ORAL | Status: DC
Start: 2014-04-30 — End: 2015-04-21

## 2014-04-30 NOTE — Telephone Encounter (Signed)
Last seen by Dr. Aundria Rud on 11/21/13.    Doctor didn't mention a follow up date.

## 2014-04-30 NOTE — Telephone Encounter (Addendum)
Ronnie Mason has confirmed with pt-he has been taking  daily

## 2014-05-11 ENCOUNTER — Ambulatory Visit (INDEPENDENT_AMBULATORY_CARE_PROVIDER_SITE_OTHER): Payer: No Typology Code available for payment source | Admitting: Internal Medicine

## 2014-05-11 ENCOUNTER — Other Ambulatory Visit: Payer: Self-pay | Admitting: Internal Medicine

## 2014-05-11 ENCOUNTER — Encounter (INDEPENDENT_AMBULATORY_CARE_PROVIDER_SITE_OTHER): Payer: Self-pay | Admitting: Internal Medicine

## 2014-05-11 ENCOUNTER — Telehealth (HOSPITAL_BASED_OUTPATIENT_CLINIC_OR_DEPARTMENT_OTHER): Payer: Self-pay | Admitting: Anesthesiology

## 2014-05-11 ENCOUNTER — Other Ambulatory Visit (HOSPITAL_BASED_OUTPATIENT_CLINIC_OR_DEPARTMENT_OTHER): Payer: Self-pay | Admitting: Anesthesiology

## 2014-05-11 VITALS — BP 164/102 | HR 68 | Temp 98.2°F | Ht 76.0 in | Wt 210.5 lb

## 2014-05-11 DIAGNOSIS — Z23 Encounter for immunization: Secondary | ICD-10-CM

## 2014-05-11 DIAGNOSIS — H9191 Unspecified hearing loss, right ear: Secondary | ICD-10-CM

## 2014-05-11 DIAGNOSIS — H6691 Otitis media, unspecified, right ear: Secondary | ICD-10-CM

## 2014-05-11 DIAGNOSIS — M542 Cervicalgia: Secondary | ICD-10-CM

## 2014-05-11 DIAGNOSIS — E785 Hyperlipidemia, unspecified: Secondary | ICD-10-CM

## 2014-05-11 DIAGNOSIS — M545 Low back pain, unspecified: Secondary | ICD-10-CM

## 2014-05-11 DIAGNOSIS — I10 Essential (primary) hypertension: Secondary | ICD-10-CM

## 2014-05-11 DIAGNOSIS — M5417 Radiculopathy, lumbosacral region: Secondary | ICD-10-CM

## 2014-05-11 LAB — COMPREHENSIVE METABOLIC PANEL
ALT (GPT): 26 U/L (ref 10–48)
AST (GOT): 31 U/L (ref 9–38)
Albumin: 4.8 g/dL (ref 3.5–5.2)
Alkaline Phosphatase (Total): 71 U/L (ref 37–159)
Anion Gap: 9 (ref 4–12)
Bilirubin (Total): 1.1 mg/dL (ref 0.2–1.3)
Calcium: 9.9 mg/dL (ref 8.9–10.2)
Carbon Dioxide, Total: 28 mEq/L (ref 22–32)
Chloride: 98 mEq/L (ref 98–108)
Creatinine: 1.12 mg/dL (ref 0.51–1.18)
GFR, Calc, African American: >60 mL/min (ref >59)
GFR, Calc, European American: >60 mL/min (ref >59)
Glucose: 93 mg/dL (ref 62–125)
Potassium: 4.6 mEq/L (ref 3.6–5.2)
Protein (Total): 7.7 g/dL (ref 6.0–8.2)
Sodium: 135 mEq/L (ref 135–145)
Urea Nitrogen: 25 mg/dL — ABNORMAL HIGH (ref 8–21)

## 2014-05-11 LAB — LIPID PANEL
Cholesterol (LDL): 75 mg/dL (ref <130)
Cholesterol/HDL Ratio: 2.1
HDL Cholesterol: 104 mg/dL (ref >39)
Non-HDL Cholesterol: 119 mg/dL (ref 0–159)
Total Cholesterol: 223 mg/dL — ABNORMAL HIGH (ref <200)
Triglyceride: 221 mg/dL — ABNORMAL HIGH (ref <150)

## 2014-05-11 LAB — SED RATE: Erythrocyte Sedimentation Rate: 4 mm/hr (ref 0–15)

## 2014-05-11 LAB — CK, CREATINE KINASE, TOTAL ACTIVITY: Creatine Kinase Total Activity: 51 U/L — ABNORMAL LOW (ref 62–325)

## 2014-05-11 LAB — C_REACTIVE PROTEIN: C_Reactive Protein: 0.9 mg/L (ref 0.0–10.0)

## 2014-05-11 MED ORDER — LIDOCAINE 5 % EX PTCH
1.0000 | MEDICATED_PATCH | Freq: Every day | CUTANEOUS | Status: DC
Start: 2014-05-11 — End: 2014-10-04

## 2014-05-11 MED ORDER — AMOXICILLIN-POT CLAVULANATE 875-125 MG OR TABS
1.0000 | ORAL_TABLET | Freq: Two times a day (BID) | ORAL | Status: DC
Start: 2014-05-11 — End: 2014-05-18

## 2014-05-11 NOTE — Progress Notes (Signed)
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

## 2014-05-11 NOTE — Telephone Encounter (Signed)
CONFIRMED PHONE NUMBER: [phone]  CALLERS FIRST AND LAST NAME: Aleda Granahomas, Jona Paul  FACILITY NAME: na TITLE: na  CALLERS RELATIONSHIP:Self  RETURN CALL: Detailed message on voicemail only     SUBJECT: Appointment Request   REASON FOR REQUEST: Patient has re-injured the Right side of his neck. Is experiencing a lot of pain from the upper neck down the the shoulder.     REQUEST APPOINTMENT WITH: Samule Ohmale, Rebecca   REFERRING PROVIDER: na  REQUESTED DATE: asap  REQUESTED TIME: mornings are best  UNABLE TO APPOINT: Other: Per SOP

## 2014-05-11 NOTE — Progress Notes (Signed)
Ronnie Mason is a 63 year old male     Review of patient's allergies indicates:  Allergies   Allergen Reactions   . Bee Venom Hives, Itching and Swelling   . Gabapentin      Flu like symptoms, diarrhea, body ache upset stomach   . Lidocaine Rash     Lidocaine patch. PATIENT STATES HE IS ALLERGIC TO THE ADHESIVE NOT THE PATCH.       BP 164/102 mmHg  Pulse 68  Temp(Src) 98.2 F (36.8 C) (Oral)  Ht 6\' 4"  (1.93 m)  Wt 210 lb 8 oz (95.482 kg)  BMI 25.63 kg/m2  SpO2 98%    Chief Complaint   Patient presents with   . Ear Problem     Complains of right ear pain x 3-4 weeks, [facility]   . Medication Review     Discuss meds   . Neck Problem     Patient states he sprained neck x 1 week while painting his house       HPI  History of:        Today follow-up above issues:     Ear pain Right ear amox - done and cortisporin- ended - 9/29  Started with swollen feeling , canal was swollen  Not chilled  Lost hearing  It started overnight  During the antibiotics draing   Draining has not stopped  No sore throat  No headache  Never experienced chills  Did not noticed himself that he was sniffling  He feel like he has a cold  No vertigo    No teeth pain  No pressure with bendinn     Neck pain chronic- prievious burn - shoulder blade /spine and trapezius shoulder- had been seeing Dr Amada Jupiter she could have the sixth but he realizes that surgery is not a good option-     bp has been high , in afternoon when pain drop bp drop 135/86     Back pain Dr Amada Jupiter did not recommend injection- low back better comes and goes- wants it fixed - currently back not in pain      Office Visit on 12/27/13   1. VITAMIN B12 (COBALAMIN)   Result Value Ref Range    Vitamin B12 (Cobalamin) 509 180 - 914 pg/mL   2. SED RATE   Result Value Ref Range    Erythrocyte Sedimentation Rate 3 0 - 15 mm/hr   3. CRP, HIGH SENSITIVITY   Result Value Ref Range    CRP, High Sensitivity 2.1 0.0 - 10.0 mg/L   4. HEMOGLOBIN A1C, HPLC   Result  Value Ref Range    Hemoglobin A1C 5.0 4.0 - 6.0 %   5. PROTEIN ELECTR. REFLX PNL   Result Value Ref Range    Protein (Total) 7.1 6.0 - 8.2 g/dL    Albumin 4.4 3.5 - 4.9 g/dL    Alpha 1 0.2 0.1 - 0.3 g/dL    Alpha 2 0.8 0.3 - 0.8 g/dL    Beta 0.8 0.6 - 1.0 g/dL    Gamma 0.9 0.4 - 1.4 g/dL    Electrophoresis Interp: Normal pattern. NORPAT   6. IMMUNOFIXATION   Result Value Ref Range    Immunofixation No monoclonal component observed. [facility]         MEDICATION PRIOR TO VISIT  Reports   Outpatient Prescriptions Prior to Visit   Medication Sig Dispense Refill   . Atorvastatin Calcium 10 MG Oral Tab Take 1  tablet (10 mg) by mouth daily. 90 tablet 1   . Cetirizine HCl 10 MG Oral Tab PRN bee sting     . Cyanocobalamin 1000 MCG Oral Tab one per day over-the-counter started 5/21 /2012 this level borderline low 1 Tab 1   . Diclofenac Sodium (VOLTAREN) 1 % Transdermal Gel Apply once daily to neck, trapezius muscle, and low back 1 Tube 5   . EPINEPHrine 0.3 MG/0.3ML Injection Solution Auto-injector Inject as instructed per patient package insert, 0.3 mg intramuscularly or subcutaneously into the thigh, if needed to treat anaphylaxis 2 Autoinjector 0   . Gabapentin 300 MG Oral Cap Start with one tablet at night and increase to twice a day in one week 60 capsule 1   . Hydrochlorothiazide 12.5 MG Oral Tab None Entered     . [START ON 05/17/2014] Hydrocodone-Acetaminophen 5-325 MG Oral Tab Take 1 tablet by mouth every 6 hours as needed for pain. Do not fill before 05/17/14 30 tablet 0   . Lisinopril 40 MG Oral Tab Take 1 tablet (40 mg) by mouth daily. Call pharmacy for refills 1-2 weeks in advance 90 tablet 3   . Nadolol 20 MG Oral Tab Take 1 tablet (20 mg) by mouth daily as needed (tremor). 30 tablet 3   . Nadolol 40 MG Oral Tab Take 1 tablet (40 mg) by mouth daily. Call pharmacy 1-2 weeks prior for refills. Follow up appt due Oct 2015 90 tablet 3     No facility-administered medications prior to visit.         REVIEW OF SYSTEMS -  are otherwise negative unless as noted in HPI above or as additional issues below    Constitutional:     Eyes:    Head ears, nose mouth throat:     Cardiovascular:   Respiratory:     Gastrointestinal:   Genitourinary:      Musculoskeletal:    Integumentary:     Neurological:   Psychiatric/Behavioral:       Endocrine:     Hematological:   Allergic/Immunologic:        PHYSICAL EXAM  BP 164/102 mmHg  Pulse 68  Temp(Src) 98.2 F (36.8 C) (Oral)  Ht 6\' 4"  (1.93 m)  Wt 210 lb 8 oz (95.482 kg)  BMI 25.63 kg/m2  SpO2 98%    Well Dressed    Get up and go -no problem        Constitution:  In general looks:   well , good coloring  No diaphoresis    Patient is Alert  & Attentive  Patient is conversive    Psychiatric Mental Status / Neurologic Mental Status:  Affect:  Pleasant   Calm  Mood:  Good    Thought::     content : no gross issue   Process : logical    Awake: Alert and attentive  Not confused    Language:   Speech is fluent  Intact comprehension    Eyes: There is no jaundice no discharge  Ears: Clear ear canal are both open without any redness no pain with pulling the track or pushing on the helix  Tympanic membrane appears to be dull bilaterally    Nose: Clear  Mouth:  His tongue looks black but then he said he just had black T  Throat: It is clear    Neck:    Thyroid not enlarged  2+ carotid pulses no bruits        Cardiovascular:   Heart Regular  rate , rhythm, no murmur  No edema    Lymph nodes:  No gross cervical swelling  No gross axillary swelling    Respiratory:   Not tachyphpnic  Clear to ascultation  No rales, rhonchi , without increased effort or stridor  No clubbing     Abdomen / Gastrointestinal:  Soft. No masses. No guarding. No rebound  No gross bulge or hernia. Abdomen, groin  No bruit or pulsatile mass  Bowel with normal tone    Genitourinary:  No suprapubic tenderness  No increased flank pain with percussion    Musculoskeletal:  No gross abnormal movement, twitch   No gross atrophy or  deformity    His neck is in discomfort he has a patch over it  His reflexes are equal in his arms bilaterally  He is not splinting as he is talking    Skin:  No gross lesion or rash    Neurologic:   Grossly intact  Memory:   Attention       Cranial Nerve         Facial Movement VII: grossly intact, no facial droop          Hematologic/Lymphatic /Immunologic:  No gross bruising or rash or hives       IMPRESSION / PLAN / DISCUSSION     Damichael was seen today for ear problem, medication review and neck problem.    Diagnoses and associated orders for this visit:    Infective right otitis media  - Amoxicillin-Pot Clavulanate (AUGMENTIN) 875-125 MG Oral Tab; Take 1 tablet by mouth every 12 hours.    Need for influenza vaccination  - influenza vaccine quadrivalent PF (adult) 0.5 mL IM - 16109    Cervicalgia  Comments: please see Dr Laddie Aquas  - Lidocaine 5 % External Patch; Apply 1 patch onto the skin daily. Apply to painful area for up to 12 hours in a 24 hour period.  - CRP, HIGH SENSITIVITY  - ANA COMPREHENSIVE ID PANEL  - RHEUMATOID FACTOR  - SED RATE  - CREATINE KINASE TOTAL ACTIVITY    Lumbosacral radiculopathy at S1  - Lidocaine 5 % External Patch; Apply 1 patch onto the skin daily. Apply to painful area for up to 12 hours in a 24 hour period.    Bilateral low back pain without sciatica  - ANA COMPREHENSIVE ID PANEL  - CREATINE KINASE TOTAL ACTIVITY    Hearing loss, right  - REFERRAL TO OTO-HEAD NECK SURGERY    Essential hypertension    Hyperlipaemia  - LIPID PANEL  - COMPREHENSIVE METABOLIC PANEL    Other Orders  - Cancel: Lido-Capsaicin-Men-Methyl Sal (MEDI-PATCH-LIDOCAINE) 0.5-0.035-5-20 % External Patch;           Possible incomplete treatment of his right otitis media/ sinus?  We'll go ahead and treat with course of Augmentin as he tells me that the sniffling is new although he did not even noticed it himself  He also has decreased sensation of hearing on the right ear  Referral to ENT I don't think he has otitis  media present    He has no pain on his tongue although it appears to be black E believes is from the black Tea    He does not think that his neck discomfort is from the cholesterol medication  Although he has had sedimentation rate and CRP done in the past we'll go ahead and repeat along with CPK    His main symptoms right now are neck discomfort  for which he has had prior radiofrequency ablation on the left side    And for this reason I think he needs to see Dr. Miguel DibbleKirschner  It is noted that his B12 previously was not low at the most recent check    Unfortunately he feels that he did not tolerate gabapentin and this is taken off his medication list  Dr. [name] is currently managing his pain medication- not sure where we are going with this    His blood pressure was elevated he believes from his pain    his lungs are clear and I don't think it's contributing to his chronic neck pain which is in a flare  We will go ahead and do imaging of his neck  He has no symptoms of radiation down his arms  Discussed consider mri but he will discuss with neurology first     he would like to also follow-up with Dr. Aundria RudWilkinson on his blood pressure      MEDICATION as of Discharge  Current Outpatient Prescriptions   Medication Sig Dispense Refill   . Amoxicillin-Pot Clavulanate (AUGMENTIN) 875-125 MG Oral Tab Take 1 tablet by mouth every 12 hours. 20 tablet 0   . Atorvastatin Calcium 10 MG Oral Tab Take 1 tablet (10 mg) by mouth daily. 90 tablet 1   . Cetirizine HCl 10 MG Oral Tab PRN bee sting     . Cyanocobalamin 1000 MCG Oral Tab one per day over-the-counter started 5/21 /2012 this level borderline low 1 Tab 1   . Diclofenac Sodium (VOLTAREN) 1 % Transdermal Gel Apply once daily to neck, trapezius muscle, and low back 1 Tube 5   . EPINEPHrine 0.3 MG/0.3ML Injection Solution Auto-injector Inject as instructed per patient package insert, 0.3 mg intramuscularly or subcutaneously into the thigh, if needed to treat anaphylaxis 2  Autoinjector 0   . Hydrochlorothiazide 12.5 MG Oral Tab None Entered     . [START ON 05/17/2014] Hydrocodone-Acetaminophen 5-325 MG Oral Tab Take 1 tablet by mouth every 6 hours as needed for pain. Do not fill before 05/17/14 30 tablet 0   . Lidocaine 5 % External Patch Apply 1 patch onto the skin daily. Apply to painful area for up to 12 hours in a 24 hour period. 30 patch 12   . Lisinopril 40 MG Oral Tab Take 1 tablet (40 mg) by mouth daily. Call pharmacy for refills 1-2 weeks in advance 90 tablet 3   . Nadolol 20 MG Oral Tab Take 1 tablet (20 mg) by mouth daily as needed (tremor). 30 tablet 3   . Nadolol 40 MG Oral Tab Take 1 tablet (40 mg) by mouth daily. Call pharmacy 1-2 weeks prior for refills. Follow up appt due Oct 2015 90 tablet 3     No current facility-administered medications for this visit.         Risk of taking medication properly and follow up discussed especially to call if problem    Patient express understanding of care plan/medications    I spent a total time of 25 minutes face-to-face with the patient, of which more than 50% was spent counseling and coordinating care as outlined in this note.    No Follow-up on file.     Cc to following MD:     Kirschner, wilkinson. [name]                 ++++++++++++++++++++++++++++++++++++++++++++++++++        Patient Active Problem List    Diagnosis  Date Noted   . 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 Tremor [R25.1] 04/20/2013   . Lumbosacral radiculopathy at S1 [M54.17] 03/22/2013     Left     . Fall [W19.XXXA] 02/23/2013     BP < 100  Occasioanal tri[p and leg weakness     . Essential tremor [G25.0] 02/15/2013   . Back pain, lumbosacral [M54.5, M54.89] 02/15/2013   . Lumbar radiculopathy, chronic [M54.16] 02/15/2013   . Neck pain [M54.2] 02/15/2013   . Chronic pain [G89.29] 12/21/2012     Now at [place], DO, Santiago Glad    Neck pain -burn c3-7 on left side per patient  Some trigger injection    Lumbar pain-since 80's better with exercise      . 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 Numbness and tingling of leg [R20.2] 12/21/2012     Left calf -left foot long term suspect from back-suspect L 45     . Chronic back pain [M54.9, G89.29] 12/21/2012     Mri in mid scape 1/214  Multilevel degenerative disc disease of the lumbar spine. Circumferential disc bulge at all levels below and including L1-2. No significant central spinal canal stenosis or neuroforaminal narrowing.  2. Mild retrolisthesis of L5 on S1.           . B12 deficiency [E53.8] 12/21/2012     Borderline low 5 /21/2014     . Chronic renal insufficiency, stage III (moderate) [N18.9] 12/21/2012     5/ 2013     . Hyperlipidemia [E78.5] 09/26/2012   . Cervicalgia [M54.2] 03/11/2010     Radiofrequency ablation of c3 to c 7     . Syncope [R55]    . HYPOTENSION  [I95.9]    . Essential hypertension [I10]    . 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 URI (upper respiratory infection) [J06.9]          Past Medical History   Diagnosis Date   . Syncope    . HYPOTENSION     . HYPERTENSION     . Carotid Sinus Hypersensitivity    . URI (upper respiratory infection)    . History of EKG 5/97   . Lipidemia    . Chronic kidney disease        History     Social History   . Marital Status: Married     Spouse Name: N/A     Number of Children: N/A   . Years of Education: N/A     Occupational History   . Not on file.     Social History Main Topics   . Smoking status: Never Smoker    . Smokeless tobacco: Never Used   . Alcohol Use: Yes   . Drug Use: No   . Sexual Activity: Not on file     Other Topics Concern   . Not on file     Social History Narrative         Family History   Problem Relation Age of Onset   . Colon Cancer Father    . Heart (other) Mother      MI   . Other Family Hx       myelodysplasia       History     Social History   . Marital Status: Married     Spouse Name: N/A     Number of Children: N/A   . Years of  Education: N/A     Social History Main Topics   . Smoking status: Never  Smoker    . Smokeless tobacco: Never Used   . Alcohol Use: Yes   . Drug Use: No   . Sexual Activity: Not on file     Other Topics Concern   . Not on file     Social History Narrative       Health Maintenance   Topic Date Due   . COLON CANCER SCREENING,FOBT/FIT  07/07/2002   . ZOSTER VACCINE  07/07/2012   . INFLUENZA VACCINE (1) 04/03/2014   . CHOLESTEROL- MALE  12/15/2018   . TETANUS BOOSTER  12/22/2022

## 2014-05-11 NOTE — Patient Instructions (Signed)
Next med to consider lyrica

## 2014-05-11 NOTE — Telephone Encounter (Signed)
How would you like to handle this request.? Do I need to call patient.  Thanks, Lucent TechnologiesHolly

## 2014-05-11 NOTE — Telephone Encounter (Signed)
Sent to Dr. [name] for response.

## 2014-05-11 NOTE — Progress Notes (Signed)
Does patient have 3 or more complaints:  NO  If yes, discuss with patient the need to schedule a follow up appointment due to limited appointment time.  Follow up appointment scheduled:  NO  Care Everywhere records available/ reconciled:  NO  PHQ2 complete:  NO  PHQ9 complete:  NO  Refills pended:  NO  Orders pended:  NO  Medications to discontinue:  NO    Health Maintenance updated:  NO    Health Maintenance   Topic Date Due    COLON CANCER SCREENING,FOBT/FIT  07/07/2002    ZOSTER VACCINE  07/07/2012    INFLUENZA VACCINE (1) 04/03/2014    CHOLESTEROL- MALE  12/15/2018    TETANUS BOOSTER  12/22/2022     VACCINE SCREENING/ORDER WHEN CONTRAINDICATIONS PRESENT    Order date: 05/11/2014  Ordering provider: Judi SaaVara Kraft, MD  Clinic stock used: YES   Interpreter used?: None  Vaccine information sheet(s) discussed, patient/parent/guardian verbalized understanding? YES   Vaccines given: Influenza  VIS given 05/11/2014 by Paulla ForePaul Roque CMA.    SCREENING: INACTIVATED VACCINES  NO 1. Do you have a high fever, severe cold-like symptoms or serious infection?  NO 2. Do you have any food allergies such as eggs, chicken, chicken feathers, chicken dander, or gelatin?  NO 3. Do you have any allergies to neomycin, streptomycin, or polymyxin?   NO 4. Have you had any severe reaction to a vaccine in the past such as a seizure, a convulsion from a high fever, or a paralysis problem called Guillain-Barre Syndrome?  NO 5. QUESTIONS FOR WOMEN: Are you pregnant or planning on becoming pregnant in the next month?    If the patient/parent/or guardian answered "yes" to any of the above questions, consult with the provider to review the responses prior to administering vaccination. Parents with a contraindication to immunization will not receive the immunization without a specific physician order to vaccinate the patient and discussion of risk and benefits related to the contraindication.     Physician Order for Administration of Vaccine(s) When  Contraindications Are Present  I have reviewed the existence in this patient's health history of possible contraindication(s) to administration of vaccine. The benefits to this patient outweigh the potential risks of immunization. Administer vaccine(s):  Paulla ForeRoque, Paul, [place] 05/11/2014 9:16 AM.

## 2014-05-11 NOTE — Telephone Encounter (Signed)
CONFIRMED PHONE NUMBER: [phone]  CALLERS FIRST AND LAST NAME: Aleda Granahomas, Marcin Paul  FACILITY NAME: na TITLE: na  CALLERS RELATIONSHIP:Self  RETURN CALL: Detailed message on voicemail only     SUBJECT: Prescription Management   REASON FOR REQUEST: Would like his Hydrocodone refilled a little bit early since his recent re-injury. Says he will not make it with the amount he has left before he can refill.     MEDICATION: Hydrocodone  CONCERN/QUESTION: Would like his Hydrocodone refilled a little bit early since his recent re-injury. Says he will not make it with the amount he has left before he can refill.   PRESCRIBING PROVIDER: Samule Ohmale, Rebecca Capen  DOSE TAKING NOW: Take 1 tablet by mouth every 6 hours as needed for pain.   PHARMACY NAME, LOCATION, & PHONE #:  [facility] [address] [place] [phone]

## 2014-05-11 NOTE — Telephone Encounter (Signed)
Please review for Dr. [name]. Thanks, [name].

## 2014-05-11 NOTE — Telephone Encounter (Signed)
For you. Thanks, Shahana Capes

## 2014-05-11 NOTE — Telephone Encounter (Signed)
Scheduled 10/14 @ 8:30    Haskell RilingLiz Petrak, PSS  [facility]

## 2014-05-12 NOTE — Progress Notes (Signed)
Quick Note:    The labs are all ok  ldl good with ok muscle enzyme  Negative inflammatory labs,  More labs pending    Office Visit on 05/11/14  1. LIPID PANEL   Result Value Ref Range    Cholesterol (Total) 223 (H) <200 mg/dL    Triglyceride 324221 (H) <150 mg/dL    Cholesterol (HDL) 401104 >39 mg/dL    Cholesterol (LDL) 75 <130 mg/dL    Non-HDL Cholesterol 119 0 - 159 mg/dL    Cholesterol/HDL Ratio 2.1    Lipid Panel, Additional Info. (NOTE)   2. CRP, HIGH SENSITIVITY   Result Value Ref Range    CRP, High Sensitivity 0.9 0.0 - 10.0 mg/L   3. SED RATE   Result Value Ref Range    Erythrocyte Sedimentation Rate 4 0 - 15 mm/hr   4. COMPREHENSIVE METABOLIC PANEL   Result Value Ref Range    Sodium 135 135 - 145 mEq/L    Potassium 4.6 3.6 - 5.2 mEq/L    Chloride 98 98 - 108 mEq/L    Carbon Dioxide, Total 28 22 - 32 mEq/L    Anion Gap 9 4 - 12    Glucose 93 62 - 125 mg/dL    Urea Nitrogen 25 (H) 8 - 21 mg/dL    Creatinine 0.271.12 2.530.51 - 1.18 mg/dL    Protein (Total) 7.7 6.0 - 8.2 g/dL    Albumin 4.8 3.5 - 5.2 g/dL    Bilirubin (Total) 1.1 0.2 - 1.3 mg/dL    Calcium 9.9 8.9 - 66.410.2 mg/dL    AST (GOT) 31 9 - 38 U/L    Alkaline Phosphatase (Total) 71 37 - 159 U/L    ALT (GPT) 26 10 - 48 U/L    GFR, Calc, European American >60 >59 mL/min    GFR, Calc, African American >60 >59 mL/min    GFR, Information    Calculated GFR in mL/min/1.73 m2 by MDRD equation. Inaccurate with changing renal function. See [email or web address]/cGFR.[email or web address]  5. CREATINE KINASE TOTAL ACTIVITY   Result Value Ref Range    Creatinine Kinase Total Activity 51 (L) 62 - 325 U/L     ______

## 2014-05-14 LAB — ANA COMPREHENSIVE ID PANEL
Anti Centromere B: NEGATIVE
Anti Chromatin: NEGATIVE
Anti Jo1: NEGATIVE
Anti RNP: NEGATIVE
Anti Ribosomal P: NEGATIVE
Anti SSA/Ro: NEGATIVE
Anti SSB/La: NEGATIVE
Anti Scl 70 Antibody: NEGATIVE
Anti Sm/RNP: NEGATIVE
Anti Sm: NEGATIVE
Anti dsDNA (EIA): 1 U/mL (ref 0–14)

## 2014-05-14 LAB — RHEUMATOID FACTOR: Rheumatoid Factor: <13 IU/mL (ref <13)

## 2014-05-14 NOTE — Telephone Encounter (Signed)
Reports spraining right side of neck while painting his house on October 3rd. Usually take 1 tab/day, has had to take 1 tab up to twice daily for a few days. Ran out of Norco yesterday (10/11). Saw PCP last Friday, for work up/x-rays; PCP did not prescribe any additional pain medications as patient is managed by Dr. [name] in clinic. Tolerating pain with Aleve and APAP. Will authorize early refill today.    Spoke to pharmacist at [facility] [place] to ok early refill on 05/14/14

## 2014-05-14 NOTE — Progress Notes (Signed)
Quick Note:    Addendum  Also negative labs for lupus type arthritis  Office Visit on 05/11/14  1. LIPID PANEL   Result Value Ref Range    Cholesterol (Total) 223 (H) <200 mg/dL    Triglyceride 161221 (H) <150 mg/dL    Cholesterol (HDL) 096104 >39 mg/dL    Cholesterol (LDL) 75 <130 mg/dL    Non-HDL Cholesterol 119 0 - 159 mg/dL    Cholesterol/HDL Ratio 2.1    Lipid Panel, Additional Info. (NOTE)   2. CRP, HIGH SENSITIVITY   Result Value Ref Range    CRP, High Sensitivity 0.9 0.0 - 10.0 mg/L   3. ANA COMPREHENSIVE ID PANEL   Result Value Ref Range    Anti dsDNA (EIA) 0 - 14 U/mL    Anti Chromatin Negative NRN    Anti Ribosomal P Negative NRN    Anti Sm Negative NRN    Anti Sm/RNP Negative NRN    Anti RNP Negative NRN    Anti SSA/Ro Negative NRN    Anti SSB/La Negative NRN    Anti Centromere B Negative NRN    Anti Scl 70 Antibody Negative NRN    Anti Jo1 Negative NRN    ANA Interpretation Comment 2   4. RHEUMATOID FACTOR   Result Value Ref Range    Rheumatoid Factor <13 <13 IU/mL   5. SED RATE   Result Value Ref Range    Erythrocyte Sedimentation Rate 4 0 - 15 mm/hr   6. COMPREHENSIVE METABOLIC PANEL   Result Value Ref Range    Sodium 135 135 - 145 mEq/L    Potassium 4.6 3.6 - 5.2 mEq/L    Chloride 98 98 - 108 mEq/L    Carbon Dioxide, Total 28 22 - 32 mEq/L    Anion Gap 9 4 - 12    Glucose 93 62 - 125 mg/dL    Urea Nitrogen 25 (H) 8 - 21 mg/dL    Creatinine 0.451.12 4.090.51 - 1.18 mg/dL    Protein (Total) 7.7 6.0 - 8.2 g/dL    Albumin 4.8 3.5 - 5.2 g/dL    Bilirubin (Total) 1.1 0.2 - 1.3 mg/dL    Calcium 9.9 8.9 - 81.110.2 mg/dL    AST (GOT) 31 9 - 38 U/L    Alkaline Phosphatase (Total) 71 37 - 159 U/L    ALT (GPT) 26 10 - 48 U/L    GFR, Calc, European American >60 >59 mL/min    GFR, Calc, African American >60 >59 mL/min    GFR, Information    Calculated GFR in mL/min/1.73 m2 by MDRD equation. Inaccurate with changing renal function. See http://[email or web address].edu/labweb/test/bclim/cGFR.html  7. CREATINE KINASE TOTAL ACTIVITY   Result Value Ref Range    Creatinine Kinase Total Activity 51 (L) 62 - 325 U/L       ______

## 2014-05-16 ENCOUNTER — Encounter (HOSPITAL_BASED_OUTPATIENT_CLINIC_OR_DEPARTMENT_OTHER): Payer: No Typology Code available for payment source | Admitting: Anesthesiology

## 2014-05-16 ENCOUNTER — Telehealth: Payer: Self-pay | Admitting: Anesthesiology

## 2014-05-16 NOTE — Telephone Encounter (Signed)
CONFIRMED PHONE NUMBER:   Telephone Information:   Home Phone [phone]   Work Phone [phone]   Mobile [phone]     CALLERS FIRST AND LAST NAME: Aleda GranaDavid Paul Friddle  FACILITY NAME: na TITLE: n  CALLERS RELATIONSHIP:Self  RETURN CALL: Detailed message on voicemail only     SUBJECT: Cancellation/Reschedule   REASON FOR CANCELLATION: Sick  RESCHEDULED: NO please have provider call and would like to reschedule

## 2014-05-16 NOTE — Telephone Encounter (Signed)
Pt has no clinical questions.  He will call clinic tomorrow to reschedule.

## 2014-05-16 NOTE — Telephone Encounter (Signed)
Thanks Markies Mowatt!

## 2014-05-18 ENCOUNTER — Encounter (INDEPENDENT_AMBULATORY_CARE_PROVIDER_SITE_OTHER): Payer: Self-pay | Admitting: Internal Medicine

## 2014-05-18 DIAGNOSIS — H6691 Otitis media, unspecified, right ear: Secondary | ICD-10-CM

## 2014-05-18 DIAGNOSIS — H9191 Unspecified hearing loss, right ear: Secondary | ICD-10-CM

## 2014-05-18 MED ORDER — AMOXICILLIN-POT CLAVULANATE 875-125 MG OR TABS
1.0000 | ORAL_TABLET | Freq: Two times a day (BID) | ORAL | Status: DC
Start: 2014-05-18 — End: 2014-07-09

## 2014-05-18 NOTE — Telephone Encounter (Signed)
Process referral.

## 2014-05-22 ENCOUNTER — Encounter (INDEPENDENT_AMBULATORY_CARE_PROVIDER_SITE_OTHER): Payer: No Typology Code available for payment source | Admitting: Otolaryngology

## 2014-05-24 ENCOUNTER — Ambulatory Visit (INDEPENDENT_AMBULATORY_CARE_PROVIDER_SITE_OTHER): Payer: No Typology Code available for payment source | Admitting: Otolaryngology

## 2014-05-24 ENCOUNTER — Other Ambulatory Visit (INDEPENDENT_AMBULATORY_CARE_PROVIDER_SITE_OTHER): Payer: Self-pay | Admitting: Cardiovascular Disease

## 2014-05-24 ENCOUNTER — Encounter (INDEPENDENT_AMBULATORY_CARE_PROVIDER_SITE_OTHER): Payer: Self-pay | Admitting: Otolaryngology

## 2014-05-24 VITALS — BP 164/102 | Ht 75.98 in | Wt 210.0 lb

## 2014-05-24 DIAGNOSIS — R251 Tremor, unspecified: Secondary | ICD-10-CM

## 2014-05-24 DIAGNOSIS — H6591 Unspecified nonsuppurative otitis media, right ear: Secondary | ICD-10-CM

## 2014-05-24 MED ORDER — METHYLPREDNISOLONE 4 MG OR TBPK
ORAL_TABLET | ORAL | Status: DC
Start: 2014-05-24 — End: 2014-07-09

## 2014-05-24 NOTE — Patient Instructions (Signed)
Otitis Media (Middle-Ear Infection) in Adults  Otitis media is another name for a middle-ear infection. It means an infection behind your eardrum. This kind of ear infection can happen after any condition that keeps fluid from draining from the middle ear. These conditions include allergies, a cold, a sore throat, or a respiratory infection.  Middle-ear infections are common in children, but they can also happen in adults. An ear infection in an adult may mean a more serious problem than in a child. So you may need additional tests. If you have an ear infection, you should see your health care provider for treatment.  What are the types of middle-ear infections?  Infections can affect the middle ear in several ways. They are:   Acute otitis media. This middle-ear infection occurs suddenly. It causes swelling and redness. Fluid and mucus become trapped inside the ear. You can have a fever and ear pain.   Otitis media with effusion. Fluid (effusion) and mucus build up in the middle ear after the infection goes away. You may feel like your middle ear is full. This can continue for months and may affect yourhearing.   Chronic otitis media with effusion. Fluid(effusion) remains in the middle ear for a long time. Or it builds up again and again, even though there is no infection. This type of middle-ear infection may be hard to treat. It may also affect your hearing.  Who is more likely to get a middle-ear infection?  You are more likely to get an ear infection if you:   Smoke or are around someone who smokes   Have seasonal or year-round allergy symptoms   Have a cold or other upper respiratory infection  What causes a middle-ear infection?  The middle ear connects to the throat by a canal called the eustachian tube. This tube helps even out the pressure between the outer ear and the inner ear. A cold or allergy can irritate the tube or cause the area around it to swell. This can keep fluid from draining from  the middle ear. The fluid builds up behind the eardrum. Bacteria and viruses can grow in this fluid. The bacteria and viruses cause the middle-ear infection.  What are the symptoms of a middle-ear infection?  Common symptoms of a middle-ear infection in adults are:   Pain in 1 or both ears   Drainage from the ear   Muffled hearing   Sore throat  You may also have a fever. Rarely, your balance can be affected.  These symptoms may be the same as for other conditions. It's important to talk with your health care provider if you think you have a middle-ear infection. If you have a high fever, severe pain behind your ear, or paralysis in your face, see your provider as soon as you can.  How is a middle-ear infection diagnosed?  Your health care provider will take a medical history and do a physical exam. He or she will look at the outer ear and eardrum with an otoscope. The otoscope is a lighted tool that lets your provider see inside the ear. A pneumatic otoscope blows a puff of air into the ear to check how well your eardrum moves. If you eardrum doesn't move well, it may mean you have fluid behind it.  Your provider may also do a test called tympanometry. This test tells how well the middle ear is working. It can find any changes in pressure in the middle ear. Your provider may test   your hearing with a tuning fork.  How is a middle-ear infection treated?  A middle-ear infection may be treated with:   Antibiotics, taken by mouth or as ear drops   Medication for pain   Decongestants, antihistamines, or nasal steroids  Your health care provider may also have you try autoinsufflation. This helps adjust the air pressure in your ear. For this, you pinch your nose and gently exhale. This forces air back through the eustachian tube.  The exact treatment for your ear infection will depend on the type of infection you have. In general, if your symptoms don't get better in 48 to 72 hours, contact your health care  provider.  Middle-ear infections can cause long-term problems if not treated. They can lead to:   Infection in other parts of the head   Permanent hearing loss   Paralysis of a nerve in your face  If you have a middle-ear infection that doesn't get better, you may need to see an ear, nose, and throat specialist (otolaryngologist). You may need a CT scan or MRI to check for head and neck cancer.  Ear tubes  Sometimes fluid stays in the middle ear even after you take antibiotics and the infection goes away. In this case, your health care provider may suggest that a small tube be placed in your ear. The tube is put at the opening of the eardrum. The tube keeps fluid from building up and relieves pressure in the middle ear. It can also help you hear better. This surgery is called myringotomy. It is not often done in adults.  The tubes usually fall out on their own after 6 months to a year.   2000-2015 [employer] [address], [place] [address]. All rights reserved. This information is not intended as a substitute for professional medical care. Always follow your healthcare professional's instructions.

## 2014-05-24 NOTE — Progress Notes (Signed)
05/24/2014    Seen in consultation at the request of:      Dear Dr. Marisa SprinklesKraft,      Thank you for trusting me with the care of Ronnie Mason.     Please feel free to call me with any questions (office: [phone]).      Sincerely,                 Doreene NestKaren  Lin, MD         CONSULTATION      PATIENT:  Ronnie Mason  DOB:  12/04/1950  MRN:  [id number]      CHIEF COMPLAINT/HPI:  Patient is a 63 year old male who presented with right hearing loss for 1 month.  Symptoms are mild-  moderate, continuous, and gradually improving.  It occurred after a recent ear infection.  Symptoms   improved with Augmentin x 3 courses and an ear drop.  Associated symptoms include imbalance and   nasal congestion.  He is able to pop his ears.  Otalgia and otorrhea resolved.  He denies tinnitus.    The patient has no past history of otological surgery, otological trauma, significant noise exposure,   RAOM, and family history of hearing loss.      Prior testing: none    Audiogram:     CT temporal bone:     MRI brain/internal auditory canals:        HEARING AID USE:  None      PMHX:  Past Medical History   Diagnosis Date   . Syncope    . HYPOTENSION     . HYPERTENSION     . Carotid Sinus Hypersensitivity    . URI (upper respiratory infection)    . History of EKG 5/97   . Lipidemia    . Chronic kidney disease      PSHX:  Past Surgical History   Procedure Laterality Date   . Unlisted procedure hands/fingers     . Unlisted procedure femur/knee     . Anes; colonoscopy  2002     repeat in 3 years   . Anes; colonoscopy & polypectomy  11/18/2007     repeat in 3 years   . Unlisted procedure spine  2013     rfa to c3 to c 7      FHX:   Family History   Problem Relation Age of Onset   . Colon Cancer Father    . Heart (other) Mother      MI   . Other Family Hx       myelodysplasia     SHX:  History     Social History   . Marital Status: Married     Spouse Name: N/A     Number of Children: N/A   . Years of Education: N/A     Occupational History   . Not on  file.     Social History Main Topics   . Smoking status: Never Smoker    . Smokeless tobacco: Never Used   . Alcohol Use: Yes   . Drug Use: No   . Sexual Activity: Not on file     Other Topics Concern   . Not on file     Social History Narrative   . No narrative on file       ALLERGIES:  is allergic to bee venom; gabapentin; and lidocaine.    MEDICATIONS:  has a current medication list which includes the following  prescription(s): amoxicillin-pot clavulanate, atorvastatin calcium, cetirizine hcl, cyanocobalamin, diclofenac sodium, epinephrine, hydrochlorothiazide, hydrocodone-acetaminophen, lidocaine, lisinopril, methylprednisolone (pak), nadolol, and nadolol.      REVIEW OF SYSTEMS:    Constitutional: denies fatigue, fever, chills, sweats   Eye: negative  ENT: + hearing loss, nasal congestion, imbalance.  Denies excessive cerumen, infections, otalgia, tinnitus, noise exposure, Nose/Sinus: anosmia/hyposmia, nasal drainage, epistaxis, facial pain, nasal obstruction, rhinorrhea, sinusitis, sneezing, Throat: taste change, voice change, hoarseness, dysphagia, lump in throat, odynophagia, post-nasal drainage, sore tongue, mouth sores, pharyngitis, snoring, tooth pain   Cardiovascular: negative   Respiratory:  negative  Gastrointestinal:  Negative  Endocrine:  negative    Neurological: negative  Psychiatric:  negative   Integumentary: negative and no rashes nor lesions of concern   Musculoskeletal: negative   Hematologic: negative  Immunologic: no known environmental allergies       PHYSICAL EXAMINATION:  Filed Vitals:    05/24/14 1054   BP: 164/102   Height: 6' 3.98" (1.93 m)   Weight: 210 lb (95.255 kg)        General: Well developed, appearing stated age and in no acute distress.  Participated in the patient interview appropriately.  Eyes: Clear conjunctive, normal lids. Pupils equal, round and reactive to light.  Extraocular movements intact.  Ears: visualized by microscopy.    Right: Pinna unremarkable.  External ear  canal without masses or skin abnormality. No cerumen.  Squamous debris on tympanic membrane, inflamed.  No obvious perforation.  Mobile TM. Middle ear with fluid.      Left: Pinna unremarkable.  External ear canal without masses or skin abnormality. No cerumen. Tympanic membrane intact and mobile without retraction or perforation. Middle ear without fluid. No hearing loss.      Nose:  Nasal mucosa normal with clear mucus.  Midline septum with no polyps or lesions.  Mild turbinate hypertrophy.  OP: Clear without masses or ulcerations. Normal lips, teeth, and gums.  Normal hard palate, soft palate, and posterior pharynx. No parotid or submandibular gland tenderness, masses, or swelling.    HP:  Clear without mucosal abnormalities or masses  Neck: Supple and without masses, midline trachea, normal thyroid without enlargement, tenderness, and masses.  Lymphatic: normal submandibular, cervical, and supraclavicular lymph nodes    Skin: No rashes   Neuro:  CN II-XII grossly intact (VII Movements of facial expression bilaterally intact and symmetrical)  Cardiovascular: Regular rate and rhythm, normal carotid pulses  Lungs:Clear to auscultation, normal effort  Extremities: No edema      Ear Procedure Note:  Consent obtained. Procedure discussed. Questions answered. Correct patient identified.    Right:  Microscope used.  Squamous debris completely removed.  Left:  Microscope used.  No cerumen.    Removal method:  Suction    Patient tolerated procedure well.         IMPRESSION/PLAN: AD acute otitis media with effusion  Audiogram  Medrol dose pack      Doreene NestKaren  Lin, MD      Cc:   Judi SaaKraft, Vara Viseskul (General)  Macario CarlsVara V Kraft, MD  [id number]  [place], [place] [id number]

## 2014-05-25 MED ORDER — NADOLOL 20 MG OR TABS
ORAL_TABLET | ORAL | Status: DC
Start: 2014-05-25 — End: 2014-08-28

## 2014-05-28 ENCOUNTER — Encounter (INDEPENDENT_AMBULATORY_CARE_PROVIDER_SITE_OTHER): Payer: Self-pay | Admitting: Neurology

## 2014-05-28 ENCOUNTER — Ambulatory Visit (INDEPENDENT_AMBULATORY_CARE_PROVIDER_SITE_OTHER): Payer: No Typology Code available for payment source | Admitting: Neurology

## 2014-05-28 VITALS — BP 152/103 | HR 70 | Ht 76.0 in | Wt 210.0 lb

## 2014-05-28 DIAGNOSIS — M47812 Spondylosis without myelopathy or radiculopathy, cervical region: Secondary | ICD-10-CM

## 2014-05-28 DIAGNOSIS — G8929 Other chronic pain: Secondary | ICD-10-CM

## 2014-05-28 DIAGNOSIS — M5417 Radiculopathy, lumbosacral region: Secondary | ICD-10-CM

## 2014-05-28 HISTORY — DX: Spondylosis without myelopathy or radiculopathy, cervical region: M47.812

## 2014-05-28 NOTE — Patient Instructions (Addendum)
I would recommend that you begin with gentle physical therapy for your neck that should include strengthening exercises and posture work.    Consider taking either naproxen or ibuprofen on a regular basis after you finish the methylprednisolone.  Follow the over the counter instructions.    Please consult with Dr. Marisa SprinklesKraft regarding ongoing need to opiates for pain.    Return for follow up in 6 months or as needed.

## 2014-05-28 NOTE — Progress Notes (Signed)
He returns for follow up of his history of low back pain and neck pain.    His low back discomfort has improved over the last 3 months. Recent work on his porch aggravated his symptoms but they did not persist.  However, after painting he developed a "stinger" on the right side of his neck.  He denies any numbness or weakness in the arm or hand.  He is using a lidocaine patch on his neck.  He has not had PT for his neck this year.  The other side of the neck was affected 1 1/2 years ago.    He has concerns about an ear infection--he is finishing a course of antibiotics.    Tried gabapentin-flu symptoms.  Abdominal symptoms muscle aches and pains.    He is taking hydrocodone in the morning and then taking naproxen in the afternoon getting some relief.  Today he wonders whether I would provide him with opiate prescriptions for his pain condition.    Pain is not interfering with his sleep.  Symptoms in the neck resolve when he lies down at night.      Hypertension followed by Dr. Aundria RudWilkinson.  He has a follow-up scheduled in December.  He has some diarrhea related to antibiotic therapy.    He is retired.  Walks dog three times per day.    Well-developed, middle-aged male in no acute distress.  BP 152/103 mmHg  Pulse 70  Ht 6\' 4"  (1.93 m)  Wt 210 lb (95.255 kg)  BMI 25.57 kg/m2  Mental Status:  Alert and oriented, with intact attention, concentration, memory, language and fund of knowledge.  Cranial Nerves:  Oculomotor intact. No facial weakness. No hearing loss. No dysarthria.  Motor: There are no abnormalities of tone, bulk, or power in the upper or lower extremities.  There is no shoulder girdle weakness.  Reflexes are preserved in the upper extremities.  There is normal coordination.  There is no dysmetria.  Sensory: There is normal touch and pain sensation.    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  The latter more severe on  the right at C5-6.      ICD-10-CM ICD-9-CM    1. 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 Chronic pain G89.29 338.29    3. 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  He has been favoring his neck for a number of weeks.  I advised him that loss of normal neck mobility was likely to worsen his neck pain.  Exercise and efforts to improve his posture were discussed.    I deferred management of his pain condition to Dr. Marisa SprinklesKraft, but perhaps he would do well to be followed by a pain clinic.  I have recommended the use of nonsteroidal agents and physical therapy in lieu of opiates.    Should he develop any focal neurological symptoms, I am happy to see him in follow-up.  For now, he is to return as needed.    Patient Instructions   I would recommend that you begin with gentle physical therapy for your neck that should include strengthening exercises and posture work.    Consider taking either naproxen or ibuprofen on a regular basis after you finish the methylprednisolone.  Follow the over the counter  instructions.    Please consult with Dr. Marisa SprinklesKraft regarding ongoing need to opiates for pain.    Return for follow up in 6 months or as needed.    Total time of greater than 30 minutes was spent face-to-face with the patient, of which more than 50% was spent counseling  as outlined in this note.

## 2014-05-29 ENCOUNTER — Encounter (INDEPENDENT_AMBULATORY_CARE_PROVIDER_SITE_OTHER): Payer: Self-pay | Admitting: Internal Medicine

## 2014-05-29 ENCOUNTER — Encounter (INDEPENDENT_AMBULATORY_CARE_PROVIDER_SITE_OTHER): Payer: No Typology Code available for payment source | Admitting: Neurology

## 2014-05-30 ENCOUNTER — Encounter (INDEPENDENT_AMBULATORY_CARE_PROVIDER_SITE_OTHER): Payer: Self-pay | Admitting: Otolaryngology

## 2014-05-30 ENCOUNTER — Telehealth (INDEPENDENT_AMBULATORY_CARE_PROVIDER_SITE_OTHER): Payer: Self-pay | Admitting: Otolaryngology

## 2014-05-30 ENCOUNTER — Encounter (INDEPENDENT_AMBULATORY_CARE_PROVIDER_SITE_OTHER): Payer: No Typology Code available for payment source | Admitting: Otolaryngology

## 2014-05-30 NOTE — Telephone Encounter (Signed)
Appointment was made for patient. Call was transferred.

## 2014-05-30 NOTE — Telephone Encounter (Signed)
CONFIRMED PHONE NUMBER: [phone]  CALLERS FIRST AND LAST NAME: Aleda Granaavid Paul Winbush  FACILITY NAME: na TITLE: na  CALLERS RELATIONSHIP:Self  RETURN CALL: General message OK     SUBJECT: General Message   REASON FOR REQUEST: Patient trying to be seen in tomorrow 05/31/2014    MESSAGE: [facility] warm transferred patient but made a mistake by not making sure clinic staff was on the other line. Please follow up with patient. Thanks!

## 2014-05-31 ENCOUNTER — Encounter (INDEPENDENT_AMBULATORY_CARE_PROVIDER_SITE_OTHER): Payer: Self-pay | Admitting: Otolaryngology

## 2014-05-31 ENCOUNTER — Encounter (INDEPENDENT_AMBULATORY_CARE_PROVIDER_SITE_OTHER): Payer: Self-pay | Admitting: Internal Medicine

## 2014-05-31 ENCOUNTER — Ambulatory Visit (INDEPENDENT_AMBULATORY_CARE_PROVIDER_SITE_OTHER): Payer: No Typology Code available for payment source | Admitting: Internal Medicine

## 2014-05-31 ENCOUNTER — Ambulatory Visit (INDEPENDENT_AMBULATORY_CARE_PROVIDER_SITE_OTHER): Payer: No Typology Code available for payment source | Admitting: Otolaryngology

## 2014-05-31 VITALS — BP 134/82 | HR 60 | Ht 76.0 in | Wt 212.6 lb

## 2014-05-31 VITALS — BP 152/104 | Ht 75.98 in | Wt 212.0 lb

## 2014-05-31 DIAGNOSIS — H6091 Unspecified otitis externa, right ear: Secondary | ICD-10-CM

## 2014-05-31 DIAGNOSIS — M545 Low back pain, unspecified: Secondary | ICD-10-CM

## 2014-05-31 MED ORDER — CLOTRIMAZOLE 1 % EX SOLN
CUTANEOUS | Status: DC
Start: 2014-05-31 — End: 2014-07-09

## 2014-05-31 MED ORDER — HYDROCODONE-ACETAMINOPHEN 5-325 MG OR TABS
1.0000 | ORAL_TABLET | Freq: Four times a day (QID) | ORAL | Status: DC | PRN
Start: 2014-05-31 — End: 2014-07-10

## 2014-05-31 MED ORDER — DULOXETINE HCL 20 MG OR CPEP
20.0000 mg | DELAYED_RELEASE_CAPSULE | Freq: Every day | ORAL | Status: DC
Start: 2014-05-31 — End: 2014-07-09

## 2014-05-31 NOTE — Progress Notes (Signed)
Ronnie Mason is a 63 year old male     Review of patient's allergies indicates:  Allergies   Allergen Reactions   . Bee Venom Hives, Itching and Swelling   . Gabapentin      Flu like symptoms, diarrhea, body ache upset stomach   . Lidocaine Rash     Lidocaine patch. PATIENT STATES HE IS ALLERGIC TO THE ADHESIVE NOT THE PATCH.       BP 134/82 mmHg  Pulse 60  Ht 6\' 4"  (1.93 m)  Wt 212 lb 9.6 oz (96.435 kg)  BMI 25.89 kg/m2  SpO2 98%    Chief Complaint   Patient presents with   . Medication Management     not taking hydrocodone & had reaction to gabapentin; nausea body aches, fever       HPI  History of:    Chronic s1 back pain , radiculopathy, sensory neuropathy, chronic pain meds- meds for years by Dr Amada Jupiter   Neck pain    Today follow-up above issues:     Doing pt - PT on it for neck as per Dr Valetta Mole- flair up on right side after painting    H/o left neck pain 3 yrs ago- that pain went away with radiofrequency ablation- for past year left neck has not been hurting     Right neck pain x now - arthritis- pain going away with PT, no radiation or and weakness or arm weakness         Hydrocodone for 3 years for neck and back pain  By Dr Amada Jupiter  1 in am , and supplement with aleve or ibuprofen   No no pain at night of significance  But does wake up with pain in am       Left leg numb knee down  Right knee- pain    For most part he is used to the neuropathy    Does drink 2-4 drinks on weekend only and not excessive      Office Visit on 05/11/14   1. LIPID PANEL   Result Value Ref Range    Cholesterol (Total) 223 (H) <200 mg/dL    Triglyceride 161 (H) <150 mg/dL    Cholesterol (HDL) 096 >39 mg/dL    Cholesterol (LDL) 75 <130 mg/dL    Non-HDL Cholesterol 119 0 - 159 mg/dL    Cholesterol/HDL Ratio 2.1     Lipid Panel, Additional Info. (NOTE)    2. CRP, HIGH SENSITIVITY   Result Value Ref Range    CRP, High Sensitivity 0.9 0.0 - 10.0 mg/L   3. ANA COMPREHENSIVE ID PANEL   Result Value Ref Range    Anti dsDNA  (EIA) 1 0 - 14 U/mL    Anti Chromatin Negative NRN    Anti Ribosomal P Negative NRN    Anti Sm Negative NRN    Anti Sm/RNP Negative NRN    Anti RNP Negative NRN    Anti SSA/Ro Negative NRN    Anti SSB/La Negative NRN    Anti Centromere B Negative NRN    Anti Scl 70 Antibody Negative NRN    Anti Jo1 Negative NRN    ANA Interpretation Comment 2       The eleven specific autoantibodies tested in the confirmatory panel are negative.     4. RHEUMATOID FACTOR   Result Value Ref Range    Rheumatoid Factor <13 <13 IU/mL   5. SED RATE   Result Value Ref Range  Erythrocyte Sedimentation Rate 4 0 - 15 mm/hr   6. COMPREHENSIVE METABOLIC PANEL   Result Value Ref Range    Sodium 135 135 - 145 mEq/L    Potassium 4.6 3.6 - 5.2 mEq/L    Chloride 98 98 - 108 mEq/L    Carbon Dioxide, Total 28 22 - 32 mEq/L    Anion Gap 9 4 - 12    Glucose 93 62 - 125 mg/dL    Urea Nitrogen 25 (H) 8 - 21 mg/dL    Creatinine 5.781.12 4.690.51 - 1.18 mg/dL    Protein (Total) 7.7 6.0 - 8.2 g/dL    Albumin 4.8 3.5 - 5.2 g/dL    Bilirubin (Total) 1.1 0.2 - 1.3 mg/dL    Calcium 9.9 8.9 - 62.910.2 mg/dL    AST (GOT) 31 9 - 38 U/L    Alkaline Phosphatase (Total) 71 37 - 159 U/L    ALT (GPT) 26 10 - 48 U/L    GFR, Calc, European American >60 >59 mL/min    GFR, Calc, African American >60 >59 mL/min    GFR, Information       Calculated GFR in mL/min/1.73 m2 by MDRD equation.  Inaccurate with changing renal function.  See http://[email or web address]/labweb/test/bclim/cGFR.html   7. CREATINE KINASE TOTAL ACTIVITY   Result Value Ref Range    Creatinine Kinase Total Activity 51 (L) 62 - 325 U/L         MEDICATION PRIOR TO VISIT  Reports   Outpatient Prescriptions Prior to Visit   Medication Sig Dispense Refill   . Amoxicillin-Pot Clavulanate (AUGMENTIN) 875-125 MG Oral Tab Take 1 tablet by mouth every 12 hours. 14 tablet 0   . Atorvastatin Calcium 10 MG Oral Tab Take 1 tablet (10 mg) by mouth daily. 90 tablet 1   . Cetirizine HCl 10 MG Oral Tab PRN bee sting     .  Cyanocobalamin 1000 MCG Oral Tab one per day over-the-counter started 5/21 /2012 this level borderline low 1 Tab 1   . Diclofenac Sodium (VOLTAREN) 1 % Transdermal Gel Apply once daily to neck, trapezius muscle, and low back 1 Tube 5   . EPINEPHrine 0.3 MG/0.3ML Injection Solution Auto-injector Inject as instructed per patient package insert, 0.3 mg intramuscularly or subcutaneously into the thigh, if needed to treat anaphylaxis 2 Autoinjector 0   . Hydrocodone-Acetaminophen 5-325 MG Oral Tab Take 1 tablet by mouth every 6 hours as needed for pain. Do not fill before 05/17/14 30 tablet 0   . Lidocaine 5 % External Patch Apply 1 patch onto the skin daily. Apply to painful area for up to 12 hours in a 24 hour period. 30 patch 12   . Lisinopril 40 MG Oral Tab Take 1 tablet (40 mg) by mouth daily. Call pharmacy for refills 1-2 weeks in advance 90 tablet 3   . MethylPREDNISolone, Pak, 4 MG Oral Tab Take by mouth as directed on package labeling for duration of 6 days. 21 tablet 0   . Nadolol 20 MG Oral Tab TAKE ONE TABLET BY MOUTH ONCE DAILY AS NEEDED FOR TREMOR 30 tablet 1   . Nadolol 40 MG Oral Tab Take 1 tablet (40 mg) by mouth daily. Call pharmacy 1-2 weeks prior for refills. Follow up appt due Oct 2015 90 tablet 3     No facility-administered medications prior to visit.         REVIEW OF SYSTEMS - are otherwise negative unless as noted in HPI above or as additional issues  below    Constitutional:          PHYSICAL EXAM  BP 134/82 mmHg  Pulse 60  Ht 6\' 4"  (1.93 m)  Wt 212 lb 9.6 oz (96.435 kg)  BMI 25.89 kg/m2  SpO2 98%    Well Dressed    Constitution:  In general looks:   well , good coloring  No diaphoresis    Patient is Alert  & Attentive  Patient is conversive      Skin:  No gross lesion or rash    Neurologic:   Grossly intact  Memory:   Attention        Hematologic/Lymphatic /Immunologic:  No gross bruising or rash or hives       IMPRESSION / PLAN / DISCUSSION     Onalee HuaDavid was seen today for medication  management.    Diagnoses and associated orders for this visit:    Bilateral low back pain without sciatica  - Hydrocodone-Acetaminophen 5-325 MG Oral Tab; Take 1 tablet by mouth every 6 hours as needed for pain. Assuming control substance management  - DULoxetine HCl 20 MG Oral CAPSULE ENTERIC COATED PARTICLES; Take 1 capsule (20 mg) by mouth daily. For first week and the increase to 2 per day        Discussed pain control and options  In past only required 1 hydrocodone /d until neck flared  Option tramadol  Option duloxetine- he chooses this- side effect discussed , dizziness, go slow, mental confusion    Problem during day so my first choice would not be nortriptylline   He is not too much bothered with the numbness    discussed lyrica    Hard to know how he reacts until he tries    See me in 1 month to discuss meds    MEDICATION as of Discharge  Current Outpatient Prescriptions   Medication Sig Dispense Refill   . Amoxicillin-Pot Clavulanate (AUGMENTIN) 875-125 MG Oral Tab Take 1 tablet by mouth every 12 hours. 14 tablet 0   . Atorvastatin Calcium 10 MG Oral Tab Take 1 tablet (10 mg) by mouth daily. 90 tablet 1   . Cetirizine HCl 10 MG Oral Tab PRN bee sting     . Clotrimazole 1 % External Solution 4 drops to right ear TID 1 bottle 3   . Cyanocobalamin 1000 MCG Oral Tab one per day over-the-counter started 5/21 /2012 this level borderline low 1 Tab 1   . Diclofenac Sodium (VOLTAREN) 1 % Transdermal Gel Apply once daily to neck, trapezius muscle, and low back 1 Tube 5   . DULoxetine HCl 20 MG Oral CAPSULE ENTERIC COATED PARTICLES Take 1 capsule (20 mg) by mouth daily. For first week and the increase to 2 per day 60 capsule 0   . EPINEPHrine 0.3 MG/0.3ML Injection Solution Auto-injector Inject as instructed per patient package insert, 0.3 mg intramuscularly or subcutaneously into the thigh, if needed to treat anaphylaxis 2 Autoinjector 0   . Hydrocodone-Acetaminophen 5-325 MG Oral Tab Take 1 tablet by mouth every  6 hours as needed for pain. Assuming control substance management 60 tablet 0   . Ibuprofen 200 MG Oral Tab Take 1 tablet (200 mg) by mouth. Give with food.mid day as needed for neck and back pain     . Lidocaine 5 % External Patch Apply 1 patch onto the skin daily. Apply to painful area for up to 12 hours in a 24 hour period. 30 patch 12   . Lisinopril  40 MG Oral Tab Take 1 tablet (40 mg) by mouth daily. Call pharmacy for refills 1-2 weeks in advance 90 tablet 3   . MethylPREDNISolone, Pak, 4 MG Oral Tab Take by mouth as directed on package labeling for duration of 6 days. 21 tablet 0   . Nadolol 20 MG Oral Tab TAKE ONE TABLET BY MOUTH ONCE DAILY AS NEEDED FOR TREMOR 30 tablet 1   . Nadolol 40 MG Oral Tab Take 1 tablet (40 mg) by mouth daily. Call pharmacy 1-2 weeks prior for refills. Follow up appt due Oct 2015 90 tablet 3     No current facility-administered medications for this visit.         Risk of taking medication properly and follow up discussed especially to call if problem    Patient     express understanding of care plan/medications    I spent a total time of 25 minutes face-to-face with the patient, of which more than 50% was spent counseling and coordinating care as outlined in this note.    Return in about 1 month (around 07/01/2014).  Plan to readjust med back down on pain meds as appropriate  Watch for ulcer on nsaid   Cc to following MD:     Holley Raring              ++++++++++++++++++++++++++++++++++++++++++++++++++        Patient Active Problem List    Diagnosis Date Noted   . 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 Tremor [R25.1] 04/20/2013   . Lumbosacral radiculopathy at S1 [M54.17] 03/22/2013     Left     . Fall [W19.XXXA] 02/23/2013     BP < 100  Occasioanal tri[p and leg weakness     . Essential tremor [G25.0] 02/15/2013   . Back pain, lumbosacral [M54.5, M54.89] 02/15/2013   . Lumbar  radiculopathy, chronic [M54.16] 02/15/2013   . Neck pain [M54.2] 02/15/2013   . Chronic pain [G89.29] 12/21/2012     Now at [facility], DO, Santiago Glad    Neck pain -burn c3-7 on left side per patient  Some trigger injection    Lumbar pain-since 80's better with exercise     . 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 Numbness and tingling of leg [R20.2] 12/21/2012     Left calf -left foot long term suspect from back-suspect L 45     . Chronic back pain [M54.9, G89.29] 12/21/2012     Mri in mid scape 1/214  Multilevel degenerative disc disease of the lumbar spine. Circumferential disc bulge at all levels below and including L1-2. No significant central spinal canal stenosis or neuroforaminal narrowing.  2. Mild retrolisthesis of L5 on S1.           . B12 deficiency [E53.8] 12/21/2012     Borderline low 5 /21/2014     . Chronic renal insufficiency, stage III (moderate) [N18.9] 12/21/2012     5/ 2013     . Hyperlipidemia [E78.5] 09/26/2012   . Cervicalgia [M54.2] 03/11/2010     Radiofrequency ablation of c3 to c 7     . Syncope [R55]    . HYPOTENSION  [I95.9]    . Essential hypertension [I10]    . 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 URI (upper respiratory infection) [J06.9]          Past  Medical History   Diagnosis Date   . Syncope    . HYPOTENSION     . HYPERTENSION     . Carotid Sinus Hypersensitivity    . URI (upper respiratory infection)    . History of EKG 5/97   . Lipidemia    . Chronic kidney disease    . 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 myelopathy or radiculopathy 05/28/2014       History     Social History   . Marital Status: Married     Spouse Name: N/A     Number of Children: N/A   . Years of Education: N/A     Occupational History   . Not on file.     Social History Main Topics   . Smoking status: Never Smoker    . Smokeless tobacco: Never Used   . Alcohol Use: Yes   . Drug Use: No   . Sexual Activity: Not on file     Other Topics Concern   . Not on file     Social  History Narrative         Family History   Problem Relation Age of Onset   . Colon Cancer Father    . Heart (other) Mother      MI   . Other Family Hx       myelodysplasia       History     Social History   . Marital Status: Married     Spouse Name: N/A     Number of Children: N/A   . Years of Education: N/A     Social History Main Topics   . Smoking status: Never Smoker    . Smokeless tobacco: Never Used   . Alcohol Use: Yes   . Drug Use: No   . Sexual Activity: Not on file     Other Topics Concern   . Not on file     Social History Narrative       Health Maintenance   Topic Date Due   . COLON CANCER SCREENING,FOBT/FIT  07/07/2002   . ZOSTER VACCINE  07/07/2012   . CHOLESTEROL- MALE  05/12/2019   . TETANUS BOOSTER  12/22/2022   . INFLUENZA VACCINE  Completed

## 2014-05-31 NOTE — Patient Instructions (Signed)
1 tablespoon apple cider vinegar  2 tablespoons water    4 drops in right ear 3x/day

## 2014-05-31 NOTE — Progress Notes (Signed)
OFFICE VISIT      PATIENT:  Ronnie Mason  DOB:  12/12/1950  MRN:  [id number]      CHIEF COMPLAINT/HPI:  Patient is a 63 year old male who presented with right hearing loss for 1 month. He is here for   audiogram review and management options.  Symptoms are mild-moderate, continuous, and did   not significantly improve with Medrol dose pack.  It occurred after a recent ear infection. Symptoms   improved with Augmentin x 3 courses and an ear drop. Associated symptoms include imbalance and   nasal congestion. He is able to pop his ears.  He denies otalgia, otorrhea, and tinnitus.       Prior testing:   Audiogram 05/25/14:  AD mod-severe mixed HL, AS mild high freq SNHL, tymp DNT, SDS 100% AD, 90% AS   CT temporal bone:   MRI brain/internal auditory canals:     HEARING AID USE:  None    PMHX:  Past Medical History   Diagnosis Date   . Syncope    . HYPOTENSION     . HYPERTENSION     . Carotid Sinus Hypersensitivity    . URI (upper respiratory infection)    . History of EKG 5/97   . Lipidemia    . Chronic kidney disease    . 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 myelopathy or radiculopathy 05/28/2014     PSHX:  Past Surgical History   Procedure Laterality Date   . Unlisted procedure hands/fingers     . Unlisted procedure femur/knee     . Anes; colonoscopy  2002     repeat in 3 years   . Anes; colonoscopy & polypectomy  11/18/2007     repeat in 3 years   . Unlisted procedure spine  2013     rfa to c3 to c 7      FHX:   Family History   Problem Relation Age of Onset   . Colon Cancer Father    . Heart (other) Mother      MI   . Other Family Hx       myelodysplasia     SHX:  History     Social History   . Marital Status: Married     Spouse Name: N/A     Number of Children: N/A   . Years of Education: N/A     Occupational History   . Not on file.     Social History Main Topics   . Smoking status: Never Smoker    . Smokeless tobacco: Never Used   . Alcohol Use: Yes   . Drug Use: No   . Sexual Activity: Not on file     Other  Topics Concern   . Not on file     Social History Narrative       ALLERGIES:  is allergic to bee venom; gabapentin; and lidocaine.    MEDICATIONS:  has a current medication list which includes the following prescription(s): amoxicillin-pot clavulanate, atorvastatin calcium, cetirizine hcl, clotrimazole, cyanocobalamin, diclofenac sodium, duloxetine hcl, epinephrine, hydrocodone-acetaminophen, ibuprofen, lidocaine, lisinopril, methylprednisolone (pak), nadolol, and nadolol.      REVIEW OF SYSTEMS:    Constitutional: denies fatigue, fever, chills, sweats   Eye: negative  ENT: + hearing loss, nasal congestion, imbalance. Denies excessive cerumen, infections, otalgia, tinnitus, noise exposure, Nose/Sinus: anosmia/hyposmia, nasal drainage, epistaxis, facial pain, nasal obstruction, rhinorrhea, sinusitis, sneezing, Throat: taste change, voice change, hoarseness, dysphagia, lump in throat, odynophagia, post-nasal drainage,  sore tongue, mouth sores, pharyngitis, snoring, tooth pain   Neurological: negative      PHYSICAL EXAMINATION:  Filed Vitals:    05/31/14 1202   BP: 152/104   Height: 6' 3.98" (1.93 m)   Weight: 212 lb (96.163 kg)        General: Well developed, appearing stated age and in no acute distress. Participated in the patient interview appropriately.  Eyes: Clear conjunctive, normal lids. Pupils equal, round and reactive to light. Extraocular movements intact.  Ears: visualized by microscopy.  Right: Pinna unremarkable. External ear canal with fungal debris.  Fungal debris on tympanic membrane, inflamed with granulation.  Pinpoint anterior perforation, middle ear without fluid.    Left: Pinna unremarkable. External ear canal without masses or skin abnormality. No cerumen. Tympanic membrane intact and mobile without retraction or perforation. Middle ear without fluid. No hearing loss.   Neuro: CN II-XII grossly intact (VII Movements of facial expression bilaterally intact and symmetrical)      Ear Procedure  Note:  Consent obtained. Procedure discussed. Questions answered. Correct patient identified.    Right: Microscope used. Fungal debris completely removed.  Left: Microscope used. No cerumen.    Removal method:  Suction    Patient tolerated procedure well.       IMPRESSION/PLAN: AD fungal otitis media    Lotrimin otic gtt  Dry ear precautions      Doreene NestKaren Lin, MD      Cc:   Judi SaaKraft, Vara Viseskul (General)  Macario CarlsVara V Kraft, MD  [id number]  [place], [place] [id number]

## 2014-05-31 NOTE — Progress Notes (Signed)
Does patient have 3 or more complaints:  NO  If yes, discuss with patient the need to schedule a follow up appointment due to limited appointment time.  Follow up appointment scheduled:  NO  Care Everywhere records available/ reconciled:  YES  PHQ2 complete:  NO  PHQ9 complete:  NO  Refills pended:  NO  Orders pended:  NO  Medications to discontinue:  NO    Health Maintenance updated:  NO    Health Maintenance   Topic Date Due    COLON CANCER SCREENING,FOBT/FIT  07/07/2002    ZOSTER VACCINE  07/07/2012    CHOLESTEROL- MALE  05/12/2019    TETANUS BOOSTER  12/22/2022    INFLUENZA VACCINE  Completed

## 2014-06-01 ENCOUNTER — Encounter (INDEPENDENT_AMBULATORY_CARE_PROVIDER_SITE_OTHER): Payer: No Typology Code available for payment source | Admitting: Otolaryngology

## 2014-06-04 ENCOUNTER — Encounter (INDEPENDENT_AMBULATORY_CARE_PROVIDER_SITE_OTHER): Payer: No Typology Code available for payment source | Admitting: Otolaryngology

## 2014-06-07 ENCOUNTER — Ambulatory Visit (INDEPENDENT_AMBULATORY_CARE_PROVIDER_SITE_OTHER): Payer: No Typology Code available for payment source | Admitting: Otolaryngology

## 2014-06-07 ENCOUNTER — Encounter (INDEPENDENT_AMBULATORY_CARE_PROVIDER_SITE_OTHER): Payer: Self-pay | Admitting: Otolaryngology

## 2014-06-07 ENCOUNTER — Encounter (INDEPENDENT_AMBULATORY_CARE_PROVIDER_SITE_OTHER): Payer: Self-pay | Admitting: Internal Medicine

## 2014-06-07 VITALS — BP 152/104 | Ht 75.98 in | Wt 212.0 lb

## 2014-06-07 DIAGNOSIS — J324 Chronic pansinusitis: Secondary | ICD-10-CM

## 2014-06-07 DIAGNOSIS — H6091 Unspecified otitis externa, right ear: Secondary | ICD-10-CM

## 2014-06-07 MED ORDER — FLUTICASONE PROPIONATE 50 MCG/ACT NA SUSP
2.0000 | Freq: Every day | NASAL | Status: DC
Start: 2014-06-07 — End: 2015-01-17

## 2014-06-07 NOTE — Progress Notes (Signed)
06/07/2014      OFFICE VISIT      PATIENT:  Ronnie Mason  DOB:  07/28/1951  MRN:  [id number]      CHIEF COMPLAINT/HPI:  Patient is a 63 year old male who presented with right hearing loss for 6 weeks.  Symptoms are   moderate, continuous, and improved with Lotrimin otic gtt.  He had a couple days with acetic acid gtt.    Associated symptoms include imbalance. He is able to pop his ears. He denies  otalgia, otorrhea,   and tinnitus.     Prior testing:   Audiogram 05/25/14: AD mod-severe mixed HL, AS mild high freq SNHL, tymp DNT, SDS 100% AD, 90% AS   CT temporal bone:   MRI brain/internal auditory canals:       Patient also reported chronic sinusitis for  many years.  Symptoms are moderate, continuous, and   unchanged.  Primary presenting symptom is nasal congestion.  He does not take antibiotics.   Associated   symptoms include posterior nasal drainage.  He denies headache, facial tenderness, nasal obstruction,   anterior nasal drainage, allergies, and fever.          PMHX:  Past Medical History   Diagnosis Date   . Syncope    . HYPOTENSION     . HYPERTENSION     . Carotid Sinus Hypersensitivity    . URI (upper respiratory infection)    . History of EKG 5/97   . Lipidemia    . Chronic kidney disease    . 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 myelopathy or radiculopathy 05/28/2014     PSHX:  Past Surgical History   Procedure Laterality Date   . Unlisted procedure hands/fingers     . Unlisted procedure femur/knee     . Anes; colonoscopy  2002     repeat in 3 years   . Anes; colonoscopy & polypectomy  11/18/2007     repeat in 3 years   . Unlisted procedure spine  2013     rfa to c3 to c 7      FHX:   Family History   Problem Relation Age of Onset   . Colon Cancer Father    . Heart (other) Mother      MI   . Other Family Hx       myelodysplasia     SHX:  History     Social History   . Marital Status: Married     Spouse Name: N/A     Number of Children: N/A   . Years of Education: N/A     Occupational History   . Not  on file.     Social History Main Topics   . Smoking status: Never Smoker    . Smokeless tobacco: Never Used   . Alcohol Use: Yes   . Drug Use: No   . Sexual Activity: Not on file     Other Topics Concern   . Not on file     Social History Narrative       ALLERGIES:  is allergic to bee venom; gabapentin; and lidocaine.    MEDICATIONS:  has a current medication list which includes the following prescription(s): amoxicillin-pot clavulanate, atorvastatin calcium, cetirizine hcl, clotrimazole, cyanocobalamin, diclofenac sodium, duloxetine hcl, epinephrine, fluticasone propionate, hydrocodone-acetaminophen, ibuprofen, lidocaine, lisinopril, methylprednisolone (pak), nadolol, and nadolol.       REVIEW OF SYSTEMS:    Constitutional: denies fatigue, fever, chills, sweats   Eye: negative  ENT: + hearing loss, nasal congestion, imbalance, post-nasal drainage. Denies excessive cerumen, infections, otalgia, tinnitus, noise exposure, Nose/Sinus: anosmia/hyposmia, nasal drainage, epistaxis, facial pain, nasal obstruction, rhinorrhea, sneezing, Throat: taste change, voice change, hoarseness, dysphagia, lump in throat, odynophagia, sore tongue, mouth sores, pharyngitis, snoring, tooth pain   Respiratory:  negative  Gastrointestinal:  Negative  Endocrine:  negative    Neurological: negative  Psychiatric:  negative   Genitourinary:  negative .  Integumentary: negative and no rashes nor lesions of concern   Musculoskeletal: negative   Hematologic: negative  Immunologic: no  environmental allergies       PHYSICAL EXAMINATION:  Filed Vitals:    06/07/14 0959   BP: 152/104   Height: 6' 3.98" (1.93 m)   Weight: 212 lb (96.163 kg)        General: Awake and alert. Well developed and participated in the patient interview appropriately.  Eyes: Pupils equal, round and reactive to light.  Extraocular movements intact.  Ears: visualized by microscopy.    Right: Pinna unremarkable.  External ear canal without masses or skin abnormality. Free of  cerumen. Tympanic membrane intact and inflamed, dull, with squamous debris.     Left: Pinna unremarkable.  External ear canal without masses or skin abnormality. Free of cerumen. Tympanic membrane intact and without retraction or perforation. Middle ear without fluid.     Nose:  Nasal mucosa normal with clear thick mucus.  Midline septum with no polyps or lesions.  Mild turbinate hypertrophy.  OP: Clear without masses or ulcerations. Normal lips, teeth, and gums.  Normal hard palate, soft palate, and posterior pharynx. No parotid or submandibular gland tenderness, masses, or swelling.    HP:  Clear without mucosal abnormalities or masses  Neck: Supple and without masses, normal carotid pulses, midline trachea, normal thyroid without enlargement, tenderness, and masses.  Lymphatic: normal submandibular, cervical, and supraclavicular lymph nodes    Skin: No rashes   Neuro:  CN II-XII grossly intact (VII Movements of facial expression bilaterally intact and symmetrical)  Cardiovascular: Regular rate and rhythm, normal carotid pulses  Lungs:Clear to auscultation, normal effort  Extremities: No edema      Ear Procedure Note:  Consent obtained. Procedure discussed. Questions answered. Correct patient identified.    Right: Microscope used. Squamous debris completely removed.  Left: Microscope used. No cerumen.    Removal method:  Suction    Patient tolerated procedure well.       IMPRESSION/PLAN: AD fungal otitis media, chronic sinusitis  Nasal irrigations, Steroid nasal spray (Flonase)  Lotrimin otic gtt  Dry ear precautions      Doreene NestKaren Lin, MD      Cc:   Judi SaaKraft, Vara Viseskul (General)  Macario CarlsVara V Kraft, MD  [phone]  [place], [place] [id number]

## 2014-06-07 NOTE — Patient Instructions (Signed)
NASAL SALINE CLEANSING - 2x/day    Flonase - 1-2 sprays in each nostril once daily    PURPOSE:  To clean the nose of debris (blood, mucus, dust) and to   restore normal acidity to the nose.    PROCEDURE:    1. Fill a basin with warm salt water.  The ratio is 1/2 of a   teaspoon of salt, 1/2 of a teaspoon of baking soda, and two cups of   clean water.    2.  Fill a soft bulb syringe (ear syringe) with salt water mixture.    3.  Lean over the sink with your head tilted to one side and depress the syringe into the upper nostril.  Gently squeeze the bulb and let the salt water go into the upper nostril; it will flow out the lower one.   If the salt water goes down your throat, while instilling the salt water, say "Coca-Cola" repeatedly.  This will help.     4.  Repeat the same procedure on the opposite side.    5.  You should instill the water 3 (three) times at each session.    6.  You need to carry out the cleansing procedure 3 (three) times a   day.

## 2014-06-14 ENCOUNTER — Telehealth (INDEPENDENT_AMBULATORY_CARE_PROVIDER_SITE_OTHER): Payer: Self-pay | Admitting: Internal Medicine

## 2014-06-14 ENCOUNTER — Ambulatory Visit (INDEPENDENT_AMBULATORY_CARE_PROVIDER_SITE_OTHER): Payer: No Typology Code available for payment source | Admitting: Otolaryngology

## 2014-06-14 ENCOUNTER — Encounter (INDEPENDENT_AMBULATORY_CARE_PROVIDER_SITE_OTHER): Payer: Self-pay | Admitting: Otolaryngology

## 2014-06-14 VITALS — BP 152/104 | Ht 75.98 in | Wt 212.0 lb

## 2014-06-14 DIAGNOSIS — H6591 Unspecified nonsuppurative otitis media, right ear: Secondary | ICD-10-CM

## 2014-06-14 NOTE — Patient Instructions (Signed)
Myringotomy and tube placement    General:    Myringotomy means a surgically-created hole in the ear drum.  After a Myringotomy is performed a tube is often placed in the hole to keep it open.  This procedure is designed to allow equalization of pressure between the middle ear space and the outside environment.  Myringotomy and tube placement are necessary when the Eustachian tube (natural ventilation duct between the throat and middle ear) does not function properly. Generally, the pressure equalizing tube placed at the time of surgery remains in the ear drum anywhere between 4-18 months.  Variable ear drum healing and different types of tubes can make this duration shorter or longer.  The procedure is usually performed under local anesthesia (numbing medication only) in adults and general anesthesia (completely asleep) in children.    Diet:    There are no dietary restrictions following myringotomy and tube placement.    Pain control:    You may experience mild ear pain following myringotomy and tube placement.  This is usually well controlled with acetaminophen.  Occasionally mild narcotic pain relievers may be prescribed by your surgeon.    Ear care following surgery:    Please use the medicated ear drops as prescribed by your surgeon.  Avoid submerging your head under water as this could allow water to enter the middle ear space through the tubes and temporary pain, vertigo or infection may result.  Small amounts of clean water (shower or well-maintained pool) may enter the ear canal and usually do not cause problems while the tubes are in place.  If the ear canals feel wet after showering or swimming you may gently wipe the outer-most portion of the ear canal with a tissue (do not enter the ear canal with a Q-tip® or your finger).  A hair dryer blown over the ear also helps to dry the ear canal.    Follow-up appointment:    Your follow up appointment in the office will be 3-4 weeks following your surgery. This  visit should be scheduled prior to your surgery. If you do not have the appointment made please contact our office.

## 2014-06-14 NOTE — Progress Notes (Signed)
OFFICE VISIT      PATIENT:  Ronnie Mason  DOB:  08/14/1950  MRN:  [id number]      CHIEF COMPLAINT/HPI:  Patient is a 63 year old male who presented with right hearing loss for 7 weeks. Symptoms are   moderate, continuous, and improved with Lotrimin otic gtt. He is able to pop his ears.  He denies otalgia,   otorrhea, vertigo, and tinnitus.  He is more concerned about nasal congestion - started Flonase and NS   last week.    Prior testing:   Audiogram 05/25/14: AD mod-severe mixed HL, AS mild high freq SNHL, tymp DNT, SDS 100% AD, 90% AS   CT temporal bone:   MRI brain/internal auditory canals:       PMHX:  Past Medical History   Diagnosis Date   . Syncope    . HYPOTENSION     . HYPERTENSION     . Carotid Sinus Hypersensitivity    . URI (upper respiratory infection)    . History of EKG 5/97   . Lipidemia    . Chronic kidney disease    . 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 myelopathy or radiculopathy 05/28/2014     PSHX:  Past Surgical History   Procedure Laterality Date   . Unlisted procedure hands/fingers     . Unlisted procedure femur/knee     . Anes; colonoscopy  2002     repeat in 3 years   . Anes; colonoscopy & polypectomy  11/18/2007     repeat in 3 years   . Unlisted procedure spine  2013     rfa to c3 to c 7      FHX:   Family History   Problem Relation Age of Onset   . Colon Cancer Father    . Heart (other) Mother      MI   . Other Family Hx       myelodysplasia     SHX:  History     Social History   . Marital Status: Married     Spouse Name: N/A     Number of Children: N/A   . Years of Education: N/A     Occupational History   . Not on file.     Social History Main Topics   . Smoking status: Never Smoker    . Smokeless tobacco: Never Used   . Alcohol Use: Yes   . Drug Use: No   . Sexual Activity: Not on file     Other Topics Concern   . Not on file     Social History Narrative       ALLERGIES:  is allergic to bee venom; gabapentin; and lidocaine.    MEDICATIONS:  has a current medication list which  includes the following prescription(s): amoxicillin-pot clavulanate, atorvastatin calcium, cetirizine hcl, clotrimazole, cyanocobalamin, diclofenac sodium, duloxetine hcl, epinephrine, fluticasone propionate, hydrocodone-acetaminophen, ibuprofen, lidocaine, lisinopril, methylprednisolone (pak), nadolol, and nadolol.      REVIEW OF SYSTEMS:    Constitutional: denies fatigue, fever, chills, sweats   Eye: negative  ENT: + hearing loss, nasal congestion, post-nasal drainage. Denies excessive cerumen, infections, otalgia, tinnitus, noise exposure, Nose/Sinus: anosmia/hyposmia, nasal drainage, epistaxis, facial pain, nasal obstruction, rhinorrhea, sneezing, Throat: taste change, voice change, hoarseness, dysphagia, lump in throat, odynophagia, sore tongue, mouth sores, pharyngitis, snoring, tooth pain   Immunologic: no environmental allergies       PHYSICAL EXAMINATION:  Filed Vitals:    06/14/14 1021   BP: 152/104  Height: 6' 3.98" (1.93 m)   Weight: 212 lb (96.163 kg)        General: Awake and alert. Well developed and participated in the patient interview appropriately.  Eyes: Pupils equal, round and reactive to light. Extraocular movements intact.  Ears: visualized by microscopy.  Right: Pinna unremarkable. External ear canal with white squamous debris. Tympanic membrane inflammation improved.   Middle ear with fluid.  Left: Pinna unremarkable. External ear canal without masses or skin abnormality. Free of cerumen. Tympanic membrane intact and without retraction or perforation. Middle ear without fluid.  Neuro: CN II-XII grossly intact (VII Movements of facial expression bilaterally intact and symmetrical)      Ear Procedure Note:  Consent obtained. Procedure discussed. Questions answered. Correct patient identified.    Right: Microscope used. Squamous debris completely removed.  Left: Microscope used. No cerumen.    Removal method:  Suction    Patient tolerated procedure well.       IMPRESSION/PLAN: AD otitis  media  Nasal irrigations, Steroid nasal spray (Flonase)  Dry ear precautions  Consider AD PET if not improved  F/u in 2 weeks      Doreene NestKaren Lin, MD      Cc:   Judi SaaKraft, Vara Viseskul (General)  Macario CarlsVara V Kraft, MD  [id number]  [place], [place] [id number]

## 2014-06-14 NOTE — Telephone Encounter (Signed)
Vista LawmanKesenia called from pharmacy for prior authorization had a few questions and needed diagnosis code.  We should be notified in 72 hours of decision.

## 2014-06-26 ENCOUNTER — Encounter (INDEPENDENT_AMBULATORY_CARE_PROVIDER_SITE_OTHER): Payer: Self-pay | Admitting: Otolaryngology

## 2014-06-26 ENCOUNTER — Ambulatory Visit (INDEPENDENT_AMBULATORY_CARE_PROVIDER_SITE_OTHER): Payer: No Typology Code available for payment source | Admitting: Otolaryngology

## 2014-06-26 VITALS — BP 152/104 | Ht 75.98 in | Wt 212.0 lb

## 2014-06-26 DIAGNOSIS — H6591 Unspecified nonsuppurative otitis media, right ear: Secondary | ICD-10-CM

## 2014-06-26 NOTE — Patient Instructions (Signed)
NASAL SALINE CLEANSING    PURPOSE:  To clean the nose of debris (blood, mucus, dust) and to   restore normal acidity to the nose.    PROCEDURE:    1. Fill a basin with warm salt water.  The ratio is 1/2 of a   teaspoon of salt, 1/2 of a teaspoon of baking soda, and two cups of   clean water.    2.  Fill a soft bulb syringe (ear syringe) with salt water mixture.    3.  Lean over the sink with your head tilted to one side and depress the syringe into the upper nostril.  Gently squeeze the bulb and let the salt water go into the upper nostril; it will flow out the lower one.   If the salt water goes down your throat, while instilling the salt water, say "Coca-Cola" repeatedly.  This will help.     4.  Repeat the same procedure on the opposite side.    5.  You should instill the water 3 (three) times at each session.    6.  You need to carry out the cleansing procedure 3 (three) times a   day.

## 2014-06-26 NOTE — Progress Notes (Signed)
OFFICE VISIT      PATIENT:  Ronnie GranaDavid Paul Mason  DOB:  07/18/1951  MRN:  [id number]      CHIEF COMPLAINT/HPI:  Patient is a 63 year old male who presented with right hearing loss for 2 months. Symptoms have much   Improved.  He is using NS and Flonase for nasal congestion. He is able to pop his ears. He denies otalgia,   otorrhea, vertigo, and tinnitus.  Going to [place] for family Thanksgiving dinner.    Prior testing:   Audiogram 05/25/14: AD mod-severe mixed HL, AS mild high freq SNHL, tymp DNT, SDS 100% AD, 90% AS   CT temporal bone:   MRI brain/internal auditory canals:         PMHX:  Past Medical History   Diagnosis Date   . Syncope    . HYPOTENSION     . HYPERTENSION     . Carotid Sinus Hypersensitivity    . URI (upper respiratory infection)    . History of EKG 5/97   . Lipidemia    . Chronic kidney disease    . 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 myelopathy or radiculopathy 05/28/2014     PSHX:  Past Surgical History   Procedure Laterality Date   . Unlisted procedure hands/fingers     . Unlisted procedure femur/knee     . Anes; colonoscopy  2002     repeat in 3 years   . Anes; colonoscopy & polypectomy  11/18/2007     repeat in 3 years   . Unlisted procedure spine  2013     rfa to c3 to c 7      FHX:   Family History   Problem Relation Age of Onset   . Colon Cancer Father    . Heart (other) Mother      MI   . Other Family Hx       myelodysplasia     SHX:  History     Social History   . Marital Status: Married     Spouse Name: N/A     Number of Children: N/A   . Years of Education: N/A     Occupational History   . Not on file.     Social History Main Topics   . Smoking status: Never Smoker    . Smokeless tobacco: Never Used   . Alcohol Use: Yes   . Drug Use: No   . Sexual Activity: Not on file     Other Topics Concern   . Not on file     Social History Narrative       ALLERGIES:  is allergic to bee venom; gabapentin; and lidocaine.    MEDICATIONS:  has a current medication list which includes the following  prescription(s): amoxicillin-pot clavulanate, atorvastatin calcium, cetirizine hcl, clotrimazole, cyanocobalamin, diclofenac sodium, duloxetine hcl, epinephrine, fluticasone propionate, hydrocodone-acetaminophen, ibuprofen, lidocaine, lisinopril, methylprednisolone (pak), nadolol, and nadolol.      REVIEW OF SYSTEMS:    Constitutional: denies fatigue, fever, chills, sweats   Eye: negative  ENT: + hearing loss, nasal congestion, post-nasal drainage. Denies excessive cerumen, infections, otalgia, tinnitus, noise exposure, Nose/Sinus: anosmia/hyposmia, nasal drainage, epistaxis, facial pain, nasal obstruction, rhinorrhea, sneezing, Throat: taste change, voice change, hoarseness, dysphagia, lump in throat, odynophagia, sore tongue, mouth sores, pharyngitis, snoring, tooth pain   Immunologic: no environmental allergies       PHYSICAL EXAMINATION:  Filed Vitals:    06/26/14 0959   BP: 152/104   Height: 6' 3.98" (  1.93 m)   Weight: 212 lb (96.163 kg)        General: Awake and alert. Well developed and participated in the patient interview appropriately.  Eyes: Pupils equal, round and reactive to light. Extraocular movements intact.  Ears: visualized by microscopy.  Right: Pinna unremarkable.  External ear canal without masses or skin abnormality. Free of cerumen. Tympanic membrane intact and without retraction or perforation. Middle ear without fluid.  Left: Pinna unremarkable. External ear canal without masses or skin abnormality. Free of cerumen. Tympanic membrane intact and without retraction or perforation. Middle ear without fluid.  Neuro: CN II-XII grossly intact (VII Movements of facial expression bilaterally intact and symmetrical)      IMPRESSION/PLAN: AD otitis media resolved  Nasal irrigations, Steroid nasal spray (Flonase)      Doreene NestKaren Lin, MD      Cc:   Judi SaaKraft, Vara Viseskul (General)  Macario CarlsVara V Kraft, MD  [id number]  [place], [place] [id number]

## 2014-06-29 ENCOUNTER — Encounter (INDEPENDENT_AMBULATORY_CARE_PROVIDER_SITE_OTHER): Payer: No Typology Code available for payment source | Admitting: Internal Medicine

## 2014-07-03 ENCOUNTER — Encounter (INDEPENDENT_AMBULATORY_CARE_PROVIDER_SITE_OTHER): Payer: Self-pay | Admitting: Internal Medicine

## 2014-07-09 ENCOUNTER — Encounter (INDEPENDENT_AMBULATORY_CARE_PROVIDER_SITE_OTHER): Payer: Self-pay | Admitting: Cardiovascular Disease

## 2014-07-09 ENCOUNTER — Ambulatory Visit (INDEPENDENT_AMBULATORY_CARE_PROVIDER_SITE_OTHER): Payer: No Typology Code available for payment source | Admitting: Cardiovascular Disease

## 2014-07-09 VITALS — BP 168/110 | HR 60 | Ht 76.0 in | Wt 217.6 lb

## 2014-07-09 DIAGNOSIS — M542 Cervicalgia: Secondary | ICD-10-CM

## 2014-07-09 DIAGNOSIS — G629 Polyneuropathy, unspecified: Secondary | ICD-10-CM

## 2014-07-09 DIAGNOSIS — I10 Essential (primary) hypertension: Secondary | ICD-10-CM

## 2014-07-09 DIAGNOSIS — I950 Idiopathic hypotension: Secondary | ICD-10-CM

## 2014-07-09 NOTE — Progress Notes (Signed)
Cardiovascular Clinical Evaluation Note    Primary Care Provider: Judi Mason, Ronnie Mason (General)    Referring Provider: No ref. provider found    DOB:  11/29/1950    CHIEF COMPLAINT: Hypertension, hypotension, chronic pain    HISTORY OF PRESENT ILLNESS: [name] has had highly reactive hypertension.  Under normal circumstances his blood pressure is generally well controlled but in response to pain, tends to accelerate excessively.  He had been doing much better following ablation of his cervical nerves.  Unfortunately he has had 2 recent problems which have caused a great deal of discomfort.  The first was an ear infection which proceeded to rupture his eardrum.  This required several months of therapy.  Following this he  Twisted the right side of his neck while working on his house.  His previous problems had been on the left side of his neck.  He iha developed neuropathic pain in that distribution since that injury.  He reports blood pressure 134/82 in Dr. Joette Mason's office.  More typical pressures have been 155/95-100.  In the past when more aggressive treatment for his hypertension was given he had syncope from hypotension which is a significant risk for him with his multiple neck and back issues.    He is currently having difficulty walking particularly uphill and downhill and stairs due to both pain and balance problems.    PROBLEM LIST:  Patient Active Problem List    Diagnosis Date Noted   . Cervicalgia [M54.2] 03/11/2010     Radiofrequency ablation of c3 to c 7     . Syncope [R55]    . HYPOTENSION  [I95.9]    . Essential hypertension [I10]    . 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 URI (upper respiratory infection) [J06.9]    . 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 Tremor [R25.1] 04/20/2013   . Lumbosacral radiculopathy at S1 [M54.17] 03/22/2013     Left     . Fall [W19.XXXA] 02/23/2013     BP <  100  Occasioanal tri[p and leg weakness     . Essential tremor [G25.0] 02/15/2013   . Back pain, lumbosacral [M54.5, M54.89] 02/15/2013   . Lumbar radiculopathy, chronic [M54.16] 02/15/2013   . Neck pain [M54.2] 02/15/2013   . Chronic pain [G89.29] 12/21/2012     Now at Ronnie Mason, Ronnie Mason    Neck pain -burn c3-7 on left side per patient  Some trigger injection    Lumbar pain-since 80's better with exercise     . 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 Numbness and tingling of leg [R20.2] 12/21/2012     Left calf -left foot long term suspect from back-suspect L 45     . Chronic back pain [M54.9, G89.29] 12/21/2012     Mri in mid scape 1/214  Multilevel degenerative disc disease of the lumbar spine. Circumferential disc bulge at all levels below and including L1-2. No significant central spinal canal stenosis or neuroforaminal narrowing.  2. Mild retrolisthesis of L5 on S1.           . B12 deficiency [E53.8] 12/21/2012     Borderline low 5 /21/2014     . Chronic renal insufficiency, stage III (moderate) [N18.9] 12/21/2012     5/ 2013     . Hyperlipidemia [E78.5] 09/26/2012       MEDICATIONS:  Current Outpatient Prescriptions   Medication Sig Dispense Refill   . Atorvastatin Calcium 10 MG Oral Tab Take 1 tablet (10 mg) by mouth daily. 90 tablet 1   . Cyanocobalamin 1000 MCG Oral Tab one per day over-the-counter started 5/21 /2012 this level borderline low 1 Tab 1   . Diclofenac Sodium (VOLTAREN) 1 % Transdermal Gel Apply once daily to neck, trapezius muscle, and low back 1 Tube 5   . EPINEPHrine 0.3 MG/0.3ML Injection Solution Auto-injector Inject as instructed per patient package insert, 0.3 mg intramuscularly or subcutaneously into the thigh, if needed to treat anaphylaxis 2 Autoinjector 0   . Fluticasone Propionate 50 MCG/ACT Nasal Suspension Spray 2 sprays into each nostril daily. 1 Inhaler 11   . Hydrocodone-Acetaminophen 5-325 MG Oral Tab Take 1 tablet by mouth every 6 hours as  needed for pain. Assuming control substance management 60 tablet 0   . Ibuprofen 200 MG Oral Tab Take 1 tablet (200 mg) by mouth. Give with food.mid day as needed for neck and back pain     . Lidocaine 5 % External Patch Apply 1 patch onto the skin daily. Apply to painful area for up to 12 hours in a 24 hour period. 30 patch 12   . Lisinopril 40 MG Oral Tab Take 1 tablet (40 mg) by mouth daily. Call pharmacy for refills 1-2 weeks in advance 90 tablet 3   . Nadolol 20 MG Oral Tab TAKE ONE TABLET BY MOUTH ONCE DAILY AS NEEDED FOR TREMOR 30 tablet 1   . Nadolol 40 MG Oral Tab Take 1 tablet (40 mg) by mouth daily. Call pharmacy 1-2 weeks prior for refills. Follow up appt due Oct 2015 90 tablet 3     No current facility-administered medications for this visit.       ALLERGIES:  Bee venom; Gabapentin; and Lidocaine      PSFH: Urine infection with ruptured tympanic membrane.  He is considering a disability from work due to his multiple problems.    REVIEW OF SYSTEMS:  See Cardiovascular HPI for pertinent positives and negatives.   RESPIRATORY:  No sputum, hemoptysis.    PHYSICAL EXAM    CONSTITUTIONAL:  This is a well-nourished male in no distress.   BP 168/110 mmHg  Pulse 60  Ht 6\' 4"  (1.93 m)  Wt 217 lb 9.6 oz (98.703 kg)  BMI 26.50 kg/m2  NECK:  Jugular venous pressure normal.    RESPIRATORY:   Clear to percussion and auscultation.  CARDIOVASCULAR: Regular rhythm.  S1 and S2 normal. No S3, S4, no murmur.  EXTREMITIES:  No clubbing, cyanosis, edema or tenderness noted.      IMPRESSION :   1.  Hypertension: This is typically been very reactive and associated with pain  2.  Previous hypotension with syncope  3.  Neuropathy  4.  Recent ear infection with perforation    PLAN:    No changes were made in his antihypertensive.  We have to be very careful in increasing the dosing in his case.  I think the best approach is to get his pain under control at which point his blood pressure typically follows.    Electronically  signed by Eilleen Kempfaniel V Estel Scholze, MD    07/09/2014  3:12 PM    CC:   Ronnie Mason, Ronnie Mason (General)  No ref. provider found

## 2014-07-09 NOTE — Patient Instructions (Signed)
Continue same meds  Get pain under control

## 2014-07-10 ENCOUNTER — Ambulatory Visit (INDEPENDENT_AMBULATORY_CARE_PROVIDER_SITE_OTHER): Payer: No Typology Code available for payment source | Admitting: Internal Medicine

## 2014-07-10 ENCOUNTER — Encounter (INDEPENDENT_AMBULATORY_CARE_PROVIDER_SITE_OTHER): Payer: Self-pay | Admitting: Internal Medicine

## 2014-07-10 VITALS — BP 138/88 | HR 64 | Ht 76.0 in | Wt 217.0 lb

## 2014-07-10 DIAGNOSIS — M545 Low back pain, unspecified: Secondary | ICD-10-CM

## 2014-07-10 DIAGNOSIS — Z800 Family history of malignant neoplasm of digestive organs: Secondary | ICD-10-CM

## 2014-07-10 DIAGNOSIS — Z1211 Encounter for screening for malignant neoplasm of colon: Secondary | ICD-10-CM

## 2014-07-10 MED ORDER — NAPROXEN SODIUM 220 MG OR CAPS
ORAL_CAPSULE | ORAL | Status: DC
Start: 2014-07-10 — End: 2015-01-17

## 2014-07-10 MED ORDER — HYDROCODONE-ACETAMINOPHEN 5-325 MG OR TABS
1.0000 | ORAL_TABLET | Freq: Four times a day (QID) | ORAL | Status: DC | PRN
Start: 2014-07-10 — End: 2014-08-28

## 2014-07-10 NOTE — Progress Notes (Signed)
Does patient have 3 or more complaints:  NO  If yes, discuss with patient the need to schedule a follow up appointment due to limited appointment time.  Follow up appointment scheduled:  NO  Care Everywhere records available/ reconciled:  YES  PHQ2 complete:  NO  PHQ9 complete:  NO  Refills pended:  NO  Orders pended:  NO  Medications to discontinue:  NO    Health Maintenance updated:  YES    Health Maintenance   Topic Date Due    HEPATITIS C SCREENING  Sep 11, 1950    HIV SCREENING  07/08/1967    COLON CANCER SCREENING,FOBT/FIT  07/07/2002    ZOSTER VACCINE  07/07/2012    CHOLESTEROL- MALE  05/12/2019    TETANUS BOOSTER  12/22/2022    INFLUENZA VACCINE  Completed

## 2014-07-10 NOTE — Progress Notes (Signed)
Ronnie Mason is a 63 year old male     Review of patient's allergies indicates:  Allergies   Allergen Reactions   . Bee Venom Hives, Itching and Swelling   . Gabapentin      Flu like symptoms, diarrhea, body ache upset stomach   . Lidocaine Rash     Lidocaine patch. PATIENT STATES HE IS ALLERGIC TO THE ADHESIVE NOT THE PATCH.       BP 138/88 mmHg  Pulse 64  Ht 6\' 4"  (1.93 m)  Wt 217 lb (98.431 kg)  BMI 26.43 kg/m2    Chief Complaint   Patient presents with   . Follow Up Visit     Discuss pain meds and disability insurance       HPI  History of:        Today follow-up above issues:      Thanksgiving was horrible                                     Wife sister's husband died last week   Mother in law sick , in [place], and  they have had to bring her back to take care of her thankfully she has recovered    Applying for disability injury   Can not draw because of tremmor  Pain in neck -somewhat stable  1977     Dr Amada Jupiter / Dr Kirschner have both seen him in the past  On going pt h/o radiofreq ablantion in neck and low back  He does not feel that he needs to go back to see Dr. Amada Jupiter    strenght hand ok/arms okay  strenth leg and stability a problem-working with physical therapy  In both legs pain     Hydrocodone ok-he is good with the 60 pills per month    Ear infection clear -at least he had a perforated eardrum    His insurance denied the duloxetine who wanted him to nsaid consistently first and also narcotics  He already takes    aleve 2 pills 4x per week already -thankfully he is not having any heartburn  But sometimes his blood pressure is quite elevated because he is in discomfort    Office Visit on 05/11/14   1. LIPID PANEL   Result Value Ref Range    Cholesterol (Total) 223 (H) <200 mg/dL    Triglyceride 161 (H) <150 mg/dL    Cholesterol (HDL) 096 >39 mg/dL    Cholesterol (LDL) 75 <130 mg/dL    Non-HDL Cholesterol 119 0 - 159 mg/dL    Cholesterol/HDL Ratio 2.1     Lipid Panel, Additional Info. (NOTE)    2.  CRP, HIGH SENSITIVITY   Result Value Ref Range    CRP, High Sensitivity 0.9 0.0 - 10.0 mg/L   3. ANA COMPREHENSIVE ID PANEL   Result Value Ref Range    Anti dsDNA (EIA) 1 0 - 14 U/mL    Anti Chromatin Negative NRN    Anti Ribosomal P Negative NRN    Anti Sm Negative NRN    Anti Sm/RNP Negative NRN    Anti RNP Negative NRN    Anti SSA/Ro Negative NRN    Anti SSB/La Negative NRN    Anti Centromere B Negative NRN    Anti Scl 70 Antibody Negative NRN    Anti Jo1 Negative NRN    ANA Interpretation Comment 2  The eleven specific autoantibodies tested in the confirmatory panel are negative.     4. RHEUMATOID FACTOR   Result Value Ref Range    Rheumatoid Factor <13 <13 IU/mL   5. SED RATE   Result Value Ref Range    Erythrocyte Sedimentation Rate 4 0 - 15 mm/hr   6. COMPREHENSIVE METABOLIC PANEL   Result Value Ref Range    Sodium 135 135 - 145 mEq/L    Potassium 4.6 3.6 - 5.2 mEq/L    Chloride 98 98 - 108 mEq/L    Carbon Dioxide, Total 28 22 - 32 mEq/L    Anion Gap 9 4 - 12    Glucose 93 62 - 125 mg/dL    Urea Nitrogen 25 (H) 8 - 21 mg/dL    Creatinine 8.651.12 7.840.51 - 1.18 mg/dL    Protein (Total) 7.7 6.0 - 8.2 g/dL    Albumin 4.8 3.5 - 5.2 g/dL    Bilirubin (Total) 1.1 0.2 - 1.3 mg/dL    Calcium 9.9 8.9 - 69.610.2 mg/dL    AST (GOT) 31 9 - 38 U/L    Alkaline Phosphatase (Total) 71 37 - 159 U/L    ALT (GPT) 26 10 - 48 U/L    GFR, Calc, European American >60 >59 mL/min    GFR, Calc, African American >60 >59 mL/min    GFR, Information       Calculated GFR in mL/min/1.73 m2 by MDRD equation.  Inaccurate with changing renal function.  See [email or web address]   7. CREATINE KINASE TOTAL ACTIVITY   Result Value Ref Range    Creatinine Kinase Total Activity 51 (L) 62 - 325 U/L         MEDICATION PRIOR TO VISIT  Reports   Outpatient Prescriptions Prior to Visit   Medication Sig Dispense Refill   . Atorvastatin Calcium 10 MG Oral Tab Take 1 tablet (10 mg) by mouth daily. 90 tablet 1   . Cyanocobalamin  1000 MCG Oral Tab one per day over-the-counter started 5/21 /2012 this level borderline low 1 Tab 1   . Diclofenac Sodium (VOLTAREN) 1 % Transdermal Gel Apply once daily to neck, trapezius muscle, and low back 1 Tube 5   . EPINEPHrine 0.3 MG/0.3ML Injection Solution Auto-injector Inject as instructed per patient package insert, 0.3 mg intramuscularly or subcutaneously into the thigh, if needed to treat anaphylaxis 2 Autoinjector 0   . Fluticasone Propionate 50 MCG/ACT Nasal Suspension Spray 2 sprays into each nostril daily. 1 Inhaler 11   . Hydrocodone-Acetaminophen 5-325 MG Oral Tab Take 1 tablet by mouth every 6 hours as needed for pain. Assuming control substance management 60 tablet 0   . Ibuprofen 200 MG Oral Tab Take 1 tablet (200 mg) by mouth. Give with food.mid day as needed for neck and back pain     . Lidocaine 5 % External Patch Apply 1 patch onto the skin daily. Apply to painful area for up to 12 hours in a 24 hour period. 30 patch 12   . Lisinopril 40 MG Oral Tab Take 1 tablet (40 mg) by mouth daily. Call pharmacy for refills 1-2 weeks in advance 90 tablet 3   . Nadolol 20 MG Oral Tab TAKE ONE TABLET BY MOUTH ONCE DAILY AS NEEDED FOR TREMOR 30 tablet 1   . Nadolol 40 MG Oral Tab Take 1 tablet (40 mg) by mouth daily. Call pharmacy 1-2 weeks prior for refills. Follow up appt due Oct 2015 90 tablet 3  No facility-administered medications prior to visit.         REVIEW OF SYSTEMS - are otherwise negative unless as noted in HPI above or as additional issues below    Constitutional:     Eyes:    Head ears, nose mouth throat:     Cardiovascular:   Respiratory:     Gastrointestinal:   Genitourinary:      Musculoskeletal:    Integumentary:     Neurological:   Psychiatric/Behavioral:       Endocrine:     Hematological:   Allergic/Immunologic:        PHYSICAL EXAM  BP 138/88 mmHg  Pulse 64  Ht 6\' 4"  (1.93 m)  Wt 217 lb (98.431 kg)  BMI 26.43 kg/m2    Well Dressed    Constitution:  In general looks:   well ,  good coloring  No diaphoresis    Patient is Alert  & Attentive  Patient is conversive    Psychiatric Mental Status / Neurologic Mental Status:  Affect:  Pleasant   Calm  Mood:  Good  Awake: Alert and attentive  Not confused    Thought::     content : no gross issue   Process : logical  Language:   Speech is fluent  Intact comprehension    Eyes: no discharge, no gross puffiness, non- icteric  Ears: clear  Nose: clear  Mouth & Throat: no lesions or exudate    Neck:    Thyroid not enlarged  2+ carotid pulses no bruits    Cardiovascular:   Heart Regular rate , rhythm, no murmur    No edema    Lymph nodes:  No gross cervical swelling  No gross axillary swelling    Respiratory:   Not tachyphpnic  Clear to ascultation  No rales, rhonchi , without increased effort or stridor  No clubbing         Musculoskeletal:  No gross abnormal movement, twitch   No gross atrophy or deformity  Range of motion of his shoulders are intact  There is no focal cervical or spine tenderness today    Skin:  No gross lesion or rash          Hematologic/Lymphatic /Immunologic:  No gross bruising or rash or hives       IMPRESSION / PLAN / DISCUSSION     Ronnie Mason was seen today for follow up visit.    Diagnoses and associated orders for this visit:    Bilateral low back pain without sciatica  - Hydrocodone-Acetaminophen 5-325 MG Oral Tab; Take 1 tablet by mouth every 6 hours as needed for pain. Assuming control substance management    Other Orders  - REFERRAL TO COLONOSCOPY           Patient has had a long history of low back pain and cervical pain  The cervical pain he feels he has fair control on but his back really bothers him  At this point he is applying for disability because he cannot function    He has already been doing Aleve 4 days a week  I'm worried about doing this medication too much but his insurance really wants him to try an anti-inflammatory first-  We'll go ahead and try daily making sure he takes it with food and see if it  improves his discomfort I really want him to try the duloxetine but they won't approve it until he has tried these medications    His previous MRI  had a lot of disc degeneration in his lumbar spine    I'm worried about long-term use of an nsaid in general -especially for the blood pressure and also risk of increasing gastritis     I just don't see this medication being a short-term medication for him  We'll see if his other specialists have any concerns and I'll send him a note    MEDICATION as of Discharge  Current Outpatient Prescriptions   Medication Sig Dispense Refill   . Atorvastatin Calcium 10 MG Oral Tab Take 1 tablet (10 mg) by mouth daily. 90 tablet 1   . Cyanocobalamin 1000 MCG Oral Tab one per day over-the-counter started 5/21 /2012 this level borderline low 1 Tab 1   . Diclofenac Sodium (VOLTAREN) 1 % Transdermal Gel Apply once daily to neck, trapezius muscle, and low back 1 Tube 5   . EPINEPHrine 0.3 MG/0.3ML Injection Solution Auto-injector Inject as instructed per patient package insert, 0.3 mg intramuscularly or subcutaneously into the thigh, if needed to treat anaphylaxis 2 Autoinjector 0   . Fluticasone Propionate 50 MCG/ACT Nasal Suspension Spray 2 sprays into each nostril daily. 1 Inhaler 11   . Hydrocodone-Acetaminophen 5-325 MG Oral Tab Take 1 tablet by mouth every 6 hours as needed for pain. Assuming control substance management 60 tablet 0   . Ibuprofen 200 MG Oral Tab Take 1 tablet (200 mg) by mouth. Give with food.mid day as needed for neck and back pain     . Lidocaine 5 % External Patch Apply 1 patch onto the skin daily. Apply to painful area for up to 12 hours in a 24 hour period. 30 patch 12   . Lisinopril 40 MG Oral Tab Take 1 tablet (40 mg) by mouth daily. Call pharmacy for refills 1-2 weeks in advance 90 tablet 3   . Nadolol 20 MG Oral Tab TAKE ONE TABLET BY MOUTH ONCE DAILY AS NEEDED FOR TREMOR 30 tablet 1   . Nadolol 40 MG Oral Tab Take 1 tablet (40 mg) by mouth daily. Call  pharmacy 1-2 weeks prior for refills. Follow up appt due Oct 2015 90 tablet 3     No current facility-administered medications for this visit.         Risk of taking medication properly and follow up discussed especially to call if problem    Patient     express understanding of care plan/medications    I spent a total time of 25 minutes face-to-face with the patient, of which more than 50% was spent counseling and coordinating care as outlined in this note.    No Follow-up on file.     Cc to following MD:     Kirschner, wilkinson              ++++++++++++++++++++++++++++++++++++++++++++++++++        Patient Active Problem List    Diagnosis Date Noted   . 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 Tremor [R25.1] 04/20/2013   . Lumbosacral radiculopathy at S1 [M54.17] 03/22/2013     Left     . Fall [W19.XXXA] 02/23/2013     BP < 100  Occasioanal tri[p and leg weakness     . Essential tremor [G25.0] 02/15/2013   . Back pain, lumbosacral [M54.5, M54.89] 02/15/2013   . Lumbar radiculopathy, chronic [M54.16] 02/15/2013   . Neck pain [M54.2] 02/15/2013   . Chronic pain [G89.29] 12/21/2012  [facility], Santiago Gladebecca Capen    Neck pain -burn c3-7 on left side per patient  Some trigger injection    Lumbar pain-since 80's better with exercise     . 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 Numbness and tingling of leg [R20.2] 12/21/2012     Left calf -left foot long term suspect from back-suspect L 45     . Chronic back pain [M54.9, G89.29] 12/21/2012     Mri in mid scape 1/214  Multilevel degenerative disc disease of the lumbar spine. Circumferential disc bulge at all levels below and including L1-2. No significant central spinal canal stenosis or neuroforaminal narrowing.  2. Mild retrolisthesis of L5 on S1.           . B12 deficiency [E53.8] 12/21/2012     Borderline low 5 /21/2014     . Chronic renal  insufficiency, stage III (moderate) [N18.9] 12/21/2012     5/ 2013     . Hyperlipidemia [E78.5] 09/26/2012   . Cervicalgia [M54.2] 03/11/2010     Radiofrequency ablation of c3 to c 7     . Syncope [R55]    . Hypotension [I95.9]    . Essential hypertension [I10]    . 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 URI (upper respiratory infection) [J06.9]          Past Medical History   Diagnosis Date   . Syncope    . HYPOTENSION     . HYPERTENSION     . Carotid Sinus Hypersensitivity    . URI (upper respiratory infection)    . History of EKG 5/97   . Lipidemia    . Chronic kidney disease    . 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 myelopathy or radiculopathy 05/28/2014       History     Social History   . Marital Status: Married     Spouse Name: N/A     Number of Children: N/A   . Years of Education: N/A     Occupational History   . Not on file.     Social History Main Topics   . Smoking status: Never Smoker    . Smokeless tobacco: Never Used   . Alcohol Use: Yes   . Drug Use: No   . Sexual Activity: Not on file     Other Topics Concern   . Not on file     Social History Narrative         Family History   Problem Relation Age of Onset   . Colon Cancer Father    . Heart (other) Mother      MI   . Other Family Hx       myelodysplasia       History     Social History   . Marital Status: Married     Spouse Name: N/A     Number of Children: N/A   . Years of Education: N/A     Social History Main Topics   . Smoking status: Never Smoker    . Smokeless tobacco: Never Used   . Alcohol Use: Yes   . Drug Use: No   . Sexual Activity: Not on file     Other Topics Concern   . Not on file     Social History Narrative       Health Maintenance   Topic Date Due   . COLON CANCER  SCREENING,FOBT/FIT  07/07/2002   . CHOLESTEROL- MALE  05/12/2019   . TETANUS BOOSTER  12/22/2022   . ZOSTER VACCINE  Completed   . INFLUENZA VACCINE  Completed   . HEPATITIS C SCREENING  Addressed   . HIV SCREENING  Addressed

## 2014-07-10 NOTE — Patient Instructions (Signed)
Ibuprofen or aleve but not both

## 2014-07-20 ENCOUNTER — Other Ambulatory Visit: Payer: Self-pay | Admitting: Family Medicine

## 2014-07-20 ENCOUNTER — Ambulatory Visit
Admission: RE | Admit: 2014-07-20 | Discharge: 2014-07-20 | Disposition: A | Discharge status: Home / Self Care | Payer: Self-pay | Source: Ambulatory Visit | Attending: Family Medicine | Admitting: Family Medicine

## 2014-07-20 ENCOUNTER — Encounter (INDEPENDENT_AMBULATORY_CARE_PROVIDER_SITE_OTHER): Payer: Self-pay

## 2014-07-20 DIAGNOSIS — M79645 Pain in left finger(s): Secondary | ICD-10-CM

## 2014-07-20 DIAGNOSIS — R52 Pain, unspecified: Secondary | ICD-10-CM

## 2014-07-24 ENCOUNTER — Ambulatory Visit (INDEPENDENT_AMBULATORY_CARE_PROVIDER_SITE_OTHER): Payer: PPO | Admitting: Internal Medicine

## 2014-07-24 ENCOUNTER — Telehealth (INDEPENDENT_AMBULATORY_CARE_PROVIDER_SITE_OTHER): Payer: Self-pay | Admitting: Internal Medicine

## 2014-07-24 ENCOUNTER — Encounter (INDEPENDENT_AMBULATORY_CARE_PROVIDER_SITE_OTHER): Payer: Self-pay | Admitting: Internal Medicine

## 2014-07-24 VITALS — BP 152/100 | HR 73 | Ht 76.0 in | Wt 213.8 lb

## 2014-07-24 DIAGNOSIS — M5417 Radiculopathy, lumbosacral region: Secondary | ICD-10-CM

## 2014-07-24 DIAGNOSIS — G8929 Other chronic pain: Secondary | ICD-10-CM

## 2014-07-24 DIAGNOSIS — M549 Dorsalgia, unspecified: Secondary | ICD-10-CM

## 2014-07-24 DIAGNOSIS — M47812 Spondylosis without myelopathy or radiculopathy, cervical region: Secondary | ICD-10-CM

## 2014-07-24 NOTE — Telephone Encounter (Signed)
Again get PA for cymbalta, he has tried the naproxen in am and it makes his stomach upset  Has been on hydrocodone and he does not want to take more than one per day as he is worried about confusion    Would like cymbalta 20 mg tablets and titrate as tolerated to 60 mg for pain    Paper work for disability for pick up tomorrow- let him know

## 2014-07-24 NOTE — Progress Notes (Signed)
Does patient have 3 or more complaints:  NO  If yes, discuss with patient the need to schedule a follow up appointment due to limited appointment time.  Follow up appointment scheduled:  NO  Care Everywhere records available/ reconciled:  YES  PHQ2 complete:  NO  PHQ9 complete:  NO  Refills pended:  NO  Orders pended:  NO  Medications to discontinue:  NO    Health Maintenance updated:  YES    Health Maintenance   Topic Date Due    COLON CANCER SCREENING,FOBT/FIT  07/07/2002    CHOLESTEROL- MALE  05/12/2019    TETANUS BOOSTER  12/22/2022    ZOSTER VACCINE  Completed    INFLUENZA VACCINE  Completed    HEPATITIS C SCREENING  Addressed    HIV SCREENING  Addressed

## 2014-07-24 NOTE — Progress Notes (Signed)
Ronnie Mason is a 63 year old male     Review of patient's allergies indicates:  Allergies   Allergen Reactions   . Bee Venom Hives, Itching and Swelling   . Gabapentin      Flu like symptoms, diarrhea, body ache upset stomach   . Lidocaine Rash     Lidocaine patch. PATIENT STATES HE IS ALLERGIC TO THE ADHESIVE NOT THE PATCH.       BP 152/100 mmHg  Pulse 73  Ht 6\' 4"  (1.93 m)  Wt 213 lb 12.8 oz (96.979 kg)  BMI 26.04 kg/m2  SpO2 97%    Chief Complaint   Patient presents with   . Follow-Up      2 wk fu       HPI  History of:        Today follow-up above issues:     nsaid aleve 2 in am - helpful - few days stomach ache    hydrocodone ok  1 /d last 2 week s in am -he really does not want to take anymore than this at this time   Last 2 nights problem sleeping - more restless because of back -hhis back is really been bothering him   Today pain for 2-3 d in low back on fire   Neck ok - mostly off pain even before aleve - helped by the ablation -that was done years ago no longer seeing Dr. 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 LIPID PANEL   Result Value Ref Range    Cholesterol (Total) 223 (H) <200 mg/dL    Triglyceride 401221 (H) <150 mg/dL    Cholesterol (HDL) 027104 >39 mg/dL    Cholesterol (LDL) 75 <130 mg/dL    Non-HDL Cholesterol 119 0 - 159 mg/dL    Cholesterol/HDL Ratio 2.1     Lipid Panel, Additional Info. (NOTE)    2. CRP, HIGH SENSITIVITY   Result Value Ref Range    CRP, High Sensitivity 0.9 0.0 - 10.0 mg/L   3. ANA COMPREHENSIVE ID PANEL   Result Value Ref Range    Anti dsDNA (EIA) 1 0 - 14 U/mL    Anti Chromatin Negative NRN       Anti Ribosomal P Negative NRN    Anti Sm Negative NRN    Anti Sm/RNP Negative NRN    Anti RNP Negative NRN    Anti SSA/Ro Negative NRN    Anti SSB/La Negative NRN    Anti Centromere B Negative NRN    Anti Scl 70 Antibody Negative NRN    Anti Jo1 Negative NRN    ANA Interpretation Comment 2       The eleven specific autoantibodies tested in the confirmatory panel are negative.     4. RHEUMATOID FACTOR   Result Value Ref Range    Rheumatoid Factor <13 <13 IU/mL   5. SED RATE   Result Value Ref Range    Erythrocyte Sedimentation Rate 4 0 - 15 mm/hr   6. COMPREHENSIVE METABOLIC PANEL  Result Value Ref Range    Sodium 135 135 - 145 mEq/L    Potassium 4.6 3.6 - 5.2 mEq/L    Chloride 98 98 - 108 mEq/L    Carbon Dioxide, Total 28 22 - 32 mEq/L    Anion Gap 9 4 - 12    Glucose 93 62 - 125 mg/dL    Urea Nitrogen 25 (H) 8 - 21 mg/dL    Creatinine 1.61 0.96 - 1.18 mg/dL    Protein (Total) 7.7 6.0 - 8.2 g/dL    Albumin 4.8 3.5 - 5.2 g/dL    Bilirubin (Total) 1.1 0.2 - 1.3 mg/dL    Calcium 9.9 8.9 - 04.5 mg/dL    AST (GOT) 31 9 - 38 U/L    Alkaline Phosphatase (Total) 71 37 - 159 U/L    ALT (GPT) 26 10 - 48 U/L    GFR, Calc, European American >60 >59 mL/min    GFR, Calc, African American >60 >59 mL/min    GFR, Information       Calculated GFR in mL/min/1.73 m2 by MDRD equation.  Inaccurate with changing renal function.  See [email or web address]   7. CREATINE KINASE TOTAL ACTIVITY   Result Value Ref Range    Creatinine Kinase Total Activity 51 (L) 62 - 325 U/L         MEDICATION PRIOR TO VISIT  Reports   Outpatient Prescriptions Prior to Visit   Medication Sig Dispense Refill   . Atorvastatin Calcium 10 MG Oral Tab Take 1 tablet (10 mg) by mouth daily. 90 tablet 1   . Cyanocobalamin 1000 MCG Oral Tab one per day over-the-counter started 5/21 /2012 this level borderline low 1 Tab 1   . Diclofenac Sodium (VOLTAREN) 1 % Transdermal Gel Apply once daily to neck, trapezius muscle, and low back 1  Tube 5   . EPINEPHrine 0.3 MG/0.3ML Injection Solution Auto-injector Inject as instructed per patient package insert, 0.3 mg intramuscularly or subcutaneously into the thigh, if needed to treat anaphylaxis 2 Autoinjector 0   . Fluticasone Propionate 50 MCG/ACT Nasal Suspension Spray 2 sprays into each nostril daily. 1 Inhaler 11   . Hydrocodone-Acetaminophen 5-325 MG Oral Tab Take 1 tablet by mouth every 6 hours as needed for pain. Assuming control substance management 60 tablet 0   . Ibuprofen 200 MG Oral Tab Take 1 tablet (200 mg) by mouth. Give with food.mid day as needed for neck and back pain     . Lidocaine 5 % External Patch Apply 1 patch onto the skin daily. Apply to painful area for up to 12 hours in a 24 hour period. 30 patch 12   . Lisinopril 40 MG Oral Tab Take 1 tablet (40 mg) by mouth daily. Call pharmacy for refills 1-2 weeks in advance 90 tablet 3   . Nadolol 20 MG Oral Tab TAKE ONE TABLET BY MOUTH ONCE DAILY AS NEEDED FOR TREMOR 30 tablet 1   . Nadolol 40 MG Oral Tab Take 1 tablet (40 mg) by mouth daily. Call pharmacy 1-2 weeks prior for refills. Follow up appt due Oct 2015 90 tablet 3   . Naproxen Sodium 220 MG Oral Cap 2 in am every day for the next 2 weeks with food and see me 1 capsule 1     No facility-administered medications prior to visit.         REVIEW OF SYSTEMS - are otherwise negative unless as noted in HPI above or as additional issues below  Constitutional:   he is not sick      Gastrointestinal: no bowel or bladder issues  Genitourinary:             PHYSICAL EXAM  BP 152/100 mmHg  Pulse 73  Ht 6\' 4"  (1.93 m)  Wt 213 lb 12.8 oz (96.979 kg)  BMI 26.04 kg/m2  SpO2 97%    Well Dressed    Constitution:  In general looks:   well , good coloring  No diaphoresis    Patient is Alert  & Attentive  Patient is conversive    Psychiatric Mental Status / Neurologic Mental Status:  Affect:  Pleasant   Calm  Mood:  Good  Awake: Alert and attentive  Not confused    Thought::     content : no  gross issue   Process : logical  Language:   Speech is fluent  Intact comprehension        Hematologic/Lymphatic /Immunologic:  No gross bruising or rash or hives       IMPRESSION / PLAN / DISCUSSION     Onalee HuaDavid was seen today for follow-up .    Diagnoses and associated orders for this visit:    Lumbosacral radiculopathy at S1  Comments: left leg    Chronic back pain    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 myelopathy or radiculopathy      [facility]     patient has known history Of spondylosis of his cervical region with history of radio frequency ablation      Patient has known history of lumbosacral  Radiculopathy of S1-his last visit with Dr. 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 Gay FillerKirshner's notes 01/18/2014 loss of  Ankle reflex and finding on EMG of active denervation with exam of hypoesthesia on S1 dermatome of left leg    Will see again if insurance will let him have the duloxetiene    MEDICATION as of Discharge  Current Outpatient Prescriptions   Medication Sig Dispense Refill   . Atorvastatin Calcium 10 MG Oral Tab Take 1 tablet (10 mg) by mouth daily. 90 tablet 1   . Cyanocobalamin 1000 MCG Oral Tab one per day over-the-counter started 5/21 /2012 this level borderline low 1 Tab 1   . Diclofenac Sodium (VOLTAREN) 1 % Transdermal Gel Apply once daily to neck, trapezius muscle, and low back 1 Tube 5   . EPINEPHrine 0.3 MG/0.3ML Injection Solution Auto-injector Inject as instructed per patient package insert, 0.3 mg intramuscularly or subcutaneously into the thigh, if needed to treat anaphylaxis 2  Autoinjector 0   . Fluticasone Propionate 50 MCG/ACT Nasal Suspension Spray 2 sprays into each nostril daily. 1 Inhaler 11   . Hydrocodone-Acetaminophen 5-325 MG Oral Tab Take 1 tablet by mouth every 6 hours as needed for pain. Assuming control substance management 60 tablet 0   . Ibuprofen 200 MG Oral Tab Take 1 tablet (200 mg) by mouth. Give with food.mid day as needed for neck and back pain     .  Lidocaine 5 % External Patch Apply 1 patch onto the skin daily. Apply to painful area for up to 12 hours in a 24 hour period. 30 patch 12   . Lisinopril 40 MG Oral Tab Take 1 tablet (40 mg) by mouth daily. Call pharmacy for refills 1-2 weeks in advance 90 tablet 3   . Nadolol 20 MG Oral Tab TAKE ONE TABLET BY MOUTH ONCE DAILY AS NEEDED FOR TREMOR 30 tablet 1   . Nadolol 40 MG Oral Tab Take 1 tablet (40 mg) by mouth daily. Call pharmacy 1-2 weeks prior for refills. Follow up appt due Oct 2015 90 tablet 3   . Naproxen Sodium 220 MG Oral Cap 2 in am every day for the next 2 weeks with food and see me 1 capsule 1     No current facility-administered medications for this visit.         Risk of taking medication properly and follow up discussed especially to call if problem    Patient     express understanding of care plan/medications    I spent a total time of 25 minutes face-to-face with the patient, of which more than 50% was spent counseling and coordinating care as outlined in this note.    No Follow-up on file.     Cc to following MD:     Kirschner   Cc this note to patient to attache to disability paper work along with MRI of lumbar spine  Cervical spine x ray  Patient to pick up form              ++++++++++++++++++++++++++++++++++++++++++++++++++        Patient Active Problem List    Diagnosis Date Noted   . 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 Tremor [R25.1] 04/20/2013   . Lumbosacral radiculopathy at S1 [M54.17]  03/22/2013     Left     . Fall [W19.XXXA] 02/23/2013     BP < 100  Occasioanal tri[p and leg weakness     . Essential tremor [G25.0] 02/15/2013   . Back pain, lumbosacral [M54.5, M54.89] 02/15/2013   . Lumbar radiculopathy, chronic [M54.16] 02/15/2013   . Neck pain [M54.2] 02/15/2013   . Chronic pain [G89.29] 12/21/2012     Now at [place], DO, Santiago Glad    Neck pain -burn c3-7 on left side per patient  Some trigger injection    Lumbar pain-since 80's better with exercise     . 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 Numbness and tingling of leg [R20.2] 12/21/2012     Left calf -left foot long term suspect from back-suspect L 45     . Chronic back pain [M54.9, G89.29] 12/21/2012     Mri in mid scape 1/214  Multilevel degenerative disc disease of the lumbar spine. Circumferential disc bulge at all levels below and including L1-2. No significant central spinal canal stenosis or neuroforaminal narrowing.  2. Mild retrolisthesis of L5 on S1.           . B12 deficiency [E53.8] 12/21/2012     Borderline low 5 /21/2014     . Chronic renal insufficiency, stage III (moderate) [N18.3] 12/21/2012     5/ 2013     . Hyperlipidemia [E78.5] 09/26/2012   . Cervicalgia [M54.2] 03/11/2010     Radiofrequency ablation of c3 to c 7     .  Syncope [R55]    . Hypotension [I95.9]    . Essential hypertension [I10]    . 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 URI (upper respiratory infection) [J06.9]          Past Medical History   Diagnosis Date   . Syncope    . HYPOTENSION     . HYPERTENSION     . Carotid Sinus Hypersensitivity    . URI (upper respiratory infection)    . History of EKG 5/97   . Lipidemia    . Chronic kidney disease    . 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 myelopathy or radiculopathy 05/28/2014       History     Social History   . Marital Status: Married     Spouse Name: N/A     Number of Children: N/A   . Years of Education: N/A     Occupational History   . Not on file.     Social History  Main Topics   . Smoking status: Never Smoker    . Smokeless tobacco: Never Used   . Alcohol Use: Yes   . Drug Use: No   . Sexual Activity: Not on file     Other Topics Concern   . Not on file     Social History Narrative         Family History   Problem Relation Age of Onset   . Colon Cancer Father    . Heart (other) Mother      MI   . Other Family Hx       myelodysplasia       History     Social History   . Marital Status: Married     Spouse Name: N/A     Number of Children: N/A   . Years of Education: N/A     Social History Main Topics   . Smoking status: Never Smoker    . Smokeless tobacco: Never Used   . Alcohol Use: Yes   . Drug Use: No   . Sexual Activity: Not on file     Other Topics Concern   . Not on file     Social History Narrative       Health Maintenance   Topic Date Due   . COLON CANCER SCREENING,FOBT/FIT  07/07/2002   . CHOLESTEROL- MALE  05/12/2019   . TETANUS BOOSTER  12/22/2022   . ZOSTER VACCINE  Completed   . INFLUENZA VACCINE  Completed   . HEPATITIS C SCREENING  Addressed   . HIV SCREENING  Addressed

## 2014-08-13 ENCOUNTER — Other Ambulatory Visit (INDEPENDENT_AMBULATORY_CARE_PROVIDER_SITE_OTHER): Payer: Self-pay | Admitting: Internal Medicine

## 2014-08-13 DIAGNOSIS — E785 Hyperlipidemia, unspecified: Secondary | ICD-10-CM

## 2014-08-13 NOTE — Telephone Encounter (Signed)
Refill Request    Last visit: 07/24/2014  Next visit: Visit date not found  Last refill: 01/18/14  Last Prescribed by: Dr. Marisa SprinklesKraft  Labs: 05/11/14    Results for orders placed or performed in visit on 05/11/14   LIPID PANEL   Result Value Ref Range    Cholesterol (Total) 223 (H) <200 mg/dL    Triglyceride 161221 (H) <150 mg/dL    Cholesterol (HDL) 096104 >39 mg/dL    Cholesterol (LDL) 75 <130 mg/dL    Non-HDL Cholesterol 119 0 - 159 mg/dL    Cholesterol/HDL Ratio 2.1     Lipid Panel, Additional Info. (NOTE)              If Applicable:    lastlab[papdiag:1]      Outpatient Prescriptions Prior to Visit   Medication Sig Dispense Refill   . Atorvastatin Calcium 10 MG Oral Tab Take 1 tablet (10 mg) by mouth daily. 90 tablet 1   . Cyanocobalamin 1000 MCG Oral Tab one per day over-the-counter started 5/21 /2012 this level borderline low 1 Tab 1   . Diclofenac Sodium (VOLTAREN) 1 % Transdermal Gel Apply once daily to neck, trapezius muscle, and low back 1 Tube 5   . EPINEPHrine 0.3 MG/0.3ML Injection Solution Auto-injector Inject as instructed per patient package insert, 0.3 mg intramuscularly or subcutaneously into the thigh, if needed to treat anaphylaxis 2 Autoinjector 0   . Fluticasone Propionate 50 MCG/ACT Nasal Suspension Spray 2 sprays into each nostril daily. 1 Inhaler 11   . Hydrocodone-Acetaminophen 5-325 MG Oral Tab Take 1 tablet by mouth every 6 hours as needed for pain. Assuming control substance management 60 tablet 0   . Ibuprofen 200 MG Oral Tab Take 1 tablet (200 mg) by mouth. Give with food.mid day as needed for neck and back pain     . Lidocaine 5 % External Patch Apply 1 patch onto the skin daily. Apply to painful area for up to 12 hours in a 24 hour period. 30 patch 12   . Lisinopril 40 MG Oral Tab Take 1 tablet (40 mg) by mouth daily. Call pharmacy for refills 1-2 weeks in advance 90 tablet 3   . Nadolol 20 MG Oral Tab TAKE ONE TABLET BY MOUTH ONCE DAILY AS NEEDED FOR TREMOR 30 tablet 1   . Nadolol 40 MG Oral  Tab Take 1 tablet (40 mg) by mouth daily. Call pharmacy 1-2 weeks prior for refills. Follow up appt due Oct 2015 90 tablet 3   . Naproxen Sodium 220 MG Oral Cap 2 in am every day for the next 2 weeks with food and see me 1 capsule 1     No facility-administered medications prior to visit.

## 2014-08-14 MED ORDER — ATORVASTATIN CALCIUM 10 MG OR TABS
10.0000 mg | ORAL_TABLET | Freq: Every day | ORAL | Status: DC
Start: 2014-08-14 — End: 2015-02-18

## 2014-08-27 ENCOUNTER — Encounter (INDEPENDENT_AMBULATORY_CARE_PROVIDER_SITE_OTHER): Payer: Self-pay | Admitting: Internal Medicine

## 2014-08-27 NOTE — Telephone Encounter (Signed)
Please get him in for follow up.

## 2014-08-28 ENCOUNTER — Encounter (INDEPENDENT_AMBULATORY_CARE_PROVIDER_SITE_OTHER): Payer: Self-pay | Admitting: Internal Medicine

## 2014-08-28 ENCOUNTER — Other Ambulatory Visit (INDEPENDENT_AMBULATORY_CARE_PROVIDER_SITE_OTHER): Payer: Self-pay | Admitting: Cardiovascular Disease

## 2014-08-28 ENCOUNTER — Ambulatory Visit (INDEPENDENT_AMBULATORY_CARE_PROVIDER_SITE_OTHER): Payer: PPO | Admitting: Internal Medicine

## 2014-08-28 ENCOUNTER — Telehealth (INDEPENDENT_AMBULATORY_CARE_PROVIDER_SITE_OTHER): Payer: Self-pay | Admitting: Internal Medicine

## 2014-08-28 VITALS — BP 164/108 | HR 66 | Ht 76.0 in | Wt 214.0 lb

## 2014-08-28 DIAGNOSIS — G894 Chronic pain syndrome: Secondary | ICD-10-CM

## 2014-08-28 DIAGNOSIS — M545 Low back pain, unspecified: Secondary | ICD-10-CM

## 2014-08-28 DIAGNOSIS — M542 Cervicalgia: Secondary | ICD-10-CM

## 2014-08-28 DIAGNOSIS — R251 Tremor, unspecified: Secondary | ICD-10-CM

## 2014-08-28 MED ORDER — HYDROCODONE-ACETAMINOPHEN 5-325 MG OR TABS
1.0000 | ORAL_TABLET | Freq: Four times a day (QID) | ORAL | Status: DC | PRN
Start: 2014-09-27 — End: 2014-11-29

## 2014-08-28 MED ORDER — HYDROCODONE-ACETAMINOPHEN 5-325 MG OR TABS
1.0000 | ORAL_TABLET | Freq: Four times a day (QID) | ORAL | Status: DC | PRN
Start: 2014-08-28 — End: 2014-11-29

## 2014-08-28 MED ORDER — HYDROCODONE-ACETAMINOPHEN 5-325 MG OR TABS
1.0000 | ORAL_TABLET | Freq: Four times a day (QID) | ORAL | Status: DC | PRN
Start: 2014-10-27 — End: 2014-11-29

## 2014-08-28 NOTE — Progress Notes (Signed)
Ronnie Mason is a 64 year old male     Review of patient's allergies indicates:  Allergies   Allergen Reactions   . Bee Venom Hives, Itching and Swelling   . Gabapentin      Flu like symptoms, diarrhea, body ache upset stomach   . Lidocaine Rash     Lidocaine patch. PATIENT STATES HE IS ALLERGIC TO THE ADHESIVE NOT THE PATCH.       Ht  (1.93 m)    Chief Complaint   Patient presents with   . 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 Ronney Lion history of hypotension was syncope    Today follow-up above issues:     Patient patient was seen December 22 for history of chronic back pain lumbosacral radiculopathy S1 and spondylosis of the cervical spine-at that visit we initiated narcotics-his insurance denied duloxetine-and they wish to see if narcotics are helpful   He tried Aleve and that gave him a stomachache-he does take it now and then-which I would rather he did not documented at the last visit his insurance would not allow duloxetine-  Gabapentin flu like symptoms   He has been on narcotic pills at one tablet per day to up to twice per day for at least 4 years has never abused this medication    It does take the pain away about 50% of the time       At the last visit 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 LIPID PANEL   Result Value Ref Range    Cholesterol (Total) 223 (H) <200 mg/dL    Triglyceride 161 (H) <150 mg/dL    Cholesterol (HDL) 096 >39 mg/dL    Cholesterol (LDL) 75 <130 mg/dL    Non-HDL Cholesterol 119 0  - 159 mg/dL    Cholesterol/HDL Ratio 2.1     Lipid Panel, Additional Info. (NOTE)    2. CRP, HIGH SENSITIVITY   Result Value Ref Range    CRP, High Sensitivity 0.9 0.0 - 10.0 mg/L   3. ANA COMPREHENSIVE ID PANEL   Result Value Ref Range    Anti dsDNA (EIA) 1 0 - 14 U/mL    Anti Chromatin Negative NRN    Anti Ribosomal P Negative NRN    Anti Sm Negative NRN    Anti Sm/RNP Negative NRN    Anti RNP Negative NRN    Anti SSA/Ro Negative NRN    Anti SSB/La Negative NRN    Anti Centromere B Negative NRN    Anti Scl 70 Antibody Negative NRN    Anti Jo1 Negative NRN    ANA Interpretation Comment 2       The eleven specific autoantibodies tested in the confirmatory panel are negative.     4. RHEUMATOID FACTOR   Result Value Ref Range    Rheumatoid Factor <13 <13 IU/mL   5. SED RATE   Result  Value Ref Range    Erythrocyte Sedimentation Rate 4 0 - 15 mm/hr   6. COMPREHENSIVE METABOLIC PANEL   Result Value Ref Range    Sodium 135 135 - 145 mEq/L    Potassium 4.6 3.6 - 5.2 mEq/L    Chloride 98 98 - 108 mEq/L    Carbon Dioxide, Total 28 22 - 32 mEq/L    Anion Gap 9 4 - 12    Glucose 93 62 - 125 mg/dL    Urea Nitrogen 25 (H) 8 - 21 mg/dL    Creatinine 6.041.12 5.400.51 - 1.18 mg/dL    Protein (Total) 7.7 6.0 - 8.2 g/dL    Albumin 4.8 3.5 - 5.2 g/dL    Bilirubin (Total) 1.1 0.2 - 1.3 mg/dL    Calcium 9.9 8.9 - 98.110.2 mg/dL    AST (GOT) 31 9 - 38 U/L    Alkaline Phosphatase (Total) 71 37 - 159 U/L    ALT (GPT) 26 10 - 48 U/L    GFR, Calc, European American >60 >59 mL/min    GFR, Calc, African American >60 >59 mL/min    GFR, Information       Calculated GFR in mL/min/1.73 m2 by MDRD equation.  Inaccurate with changing renal function.  See [email or web address]   7. CREATINE KINASE TOTAL ACTIVITY   Result Value Ref Range    Creatinine Kinase Total Activity 51 (L) 62 - 325 U/L         MEDICATION PRIOR TO VISIT  Reports   Outpatient Prescriptions Prior to Visit   Medication Sig Dispense Refill   . Atorvastatin  Calcium 10 MG Oral Tab Take 1 tablet (10 mg) by mouth daily. 90 tablet 1   . Cyanocobalamin 1000 MCG Oral Tab one per day over-the-counter started 5/21 /2012 this level borderline low 1 Tab 1   . Diclofenac Sodium (VOLTAREN) 1 % Transdermal Gel Apply once daily to neck, trapezius muscle, and low back 1 Tube 5   . EPINEPHrine 0.3 MG/0.3ML Injection Solution Auto-injector Inject as instructed per patient package insert, 0.3 mg intramuscularly or subcutaneously into the thigh, if needed to treat anaphylaxis 2 Autoinjector 0   . Fluticasone Propionate 50 MCG/ACT Nasal Suspension Spray 2 sprays into each nostril daily. 1 Inhaler 11   . Hydrocodone-Acetaminophen 5-325 MG Oral Tab Take 1 tablet by mouth every 6 hours as needed for pain. Assuming control substance management 60 tablet 0   . Ibuprofen 200 MG Oral Tab Take 1 tablet (200 mg) by mouth. Give with food.mid day as needed for neck and back pain     . Lidocaine 5 % External Patch Apply 1 patch onto the skin daily. Apply to painful area for up to 12 hours in a 24 hour period. 30 patch 12   . Lisinopril 40 MG Oral Tab Take 1 tablet (40 mg) by mouth daily. Call pharmacy for refills 1-2 weeks in advance 90 tablet 3   . Nadolol 20 MG Oral Tab TAKE ONE TABLET BY MOUTH ONCE DAILY AS NEEDED FOR TREMOR 30 tablet 1   . Nadolol 40 MG Oral Tab Take 1 tablet (40 mg) by mouth daily. Call pharmacy 1-2 weeks prior for refills. Follow up appt due Oct 2015 90 tablet 3   . Naproxen Sodium 220 MG Oral Cap 2 in am every day for the next 2 weeks with food and see me 1 capsule 1     No facility-administered medications prior to visit.  REVIEW OF SYSTEMS - are otherwise negative unless as noted in HPI above or as additional issues below    Constitutional:          PHYSICAL EXAM  Ht 6\' 4"  (1.93 m)    Well Dressed    Constitution:  In general looks:   well , good coloring  No diaphoresis    Patient is Alert  & Attentive  Patient is conversive    Normal gait     Hematologic/Lymphatic  /Immunologic:  No gross bruising or rash or hives       IMPRESSION / PLAN / DISCUSSION     There are no diagnoses linked to this encounter.    Patient is here to refill his narcotics   He has no history of addiction or abuse. this dose has been stable for years      with this medication his pain has gone down 50% of the time      he does have upset stomach with Aleve    and we'll try insurance again to see if they will cover Cymbalta  He gets flulike symptoms with gabapentin and I would not try this again      We discussed going back to see Dr. Miguel Dibble  but as there has been no changes in his symptoms will hold off on further neurological evaluation  MRI with extensive degeneration of disc  And he's had nerve block I believe L5 in the past      MEDICATION as of Discharge  Current Outpatient Prescriptions   Medication Sig Dispense Refill   . Atorvastatin Calcium 10 MG Oral Tab Take 1 tablet (10 mg) by mouth daily. 90 tablet 1   . Cyanocobalamin 1000 MCG Oral Tab one per day over-the-counter started 5/21 /2012 this level borderline low 1 Tab 1   . Diclofenac Sodium (VOLTAREN) 1 % Transdermal Gel Apply once daily to neck, trapezius muscle, and low back 1 Tube 5   . EPINEPHrine 0.3 MG/0.3ML Injection Solution Auto-injector Inject as instructed per patient package insert, 0.3 mg intramuscularly or subcutaneously into the thigh, if needed to treat anaphylaxis 2 Autoinjector 0   . Fluticasone Propionate 50 MCG/ACT Nasal Suspension Spray 2 sprays into each nostril daily. 1 Inhaler 11   . Hydrocodone-Acetaminophen 5-325 MG Oral Tab Take 1 tablet by mouth every 6 hours as needed for pain. Assuming control substance management 60 tablet 0   . Ibuprofen 200 MG Oral Tab Take 1 tablet (200 mg) by mouth. Give with food.mid day as needed for neck and back pain     . Lidocaine 5 % External Patch Apply 1 patch onto the skin daily. Apply to painful area for up to 12 hours in a 24 hour period. 30 patch 12   . Lisinopril 40 MG Oral  Tab Take 1 tablet (40 mg) by mouth daily. Call pharmacy for refills 1-2 weeks in advance 90 tablet 3   . Nadolol 20 MG Oral Tab TAKE ONE TABLET BY MOUTH ONCE DAILY AS NEEDED FOR TREMOR 30 tablet 1   . Nadolol 40 MG Oral Tab Take 1 tablet (40 mg) by mouth daily. Call pharmacy 1-2 weeks prior for refills. Follow up appt due Oct 2015 90 tablet 3   . Naproxen Sodium 220 MG Oral Cap 2 in am every day for the next 2 weeks with food and see me 1 capsule 1     No current facility-administered medications for this visit.  Risk of taking medication properly and follow up discussed especially to call if problem    Patient   Family Member -   express understanding of care plan/medications    I spent a total time of 25 minutes face-to-face with the patient, of which more than 50% was spent counseling and coordinating care as outlined in this note.    No Follow-up on file.     Cc to following MD:                ++++++++++++++++++++++++++++++++++++++++++++++++++        Patient Active Problem List    Diagnosis Date Noted   . 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 Tremor [R25.1] 04/20/2013   . Lumbosacral radiculopathy at S1 [M54.17] 03/22/2013     Left     . Fall [W19.XXXA] 02/23/2013     BP < 100  Occasioanal tri[p and leg weakness     . Essential tremor [G25.0] 02/15/2013   . Back pain, lumbosacral [M54.5, M54.89] 02/15/2013   . Lumbar radiculopathy, chronic [M54.16] 02/15/2013   . Neck pain [M54.2] 02/15/2013   . Chronic pain [G89.29] 12/21/2012     Now at [facility], DO, Santiago Glad    Neck pain -burn c3-7 on left side per patient  Some trigger injection    Lumbar pain-since 80's better with exercise     . 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 Numbness and tingling of leg [R20.2] 12/21/2012     Left calf -left foot long term suspect from back-suspect L 45     . Chronic back pain [M54.9, G89.29]  12/21/2012     Mri in mid scape 1/214  Multilevel degenerative disc disease of the lumbar spine. Circumferential disc bulge at all levels below and including L1-2. No significant central spinal canal stenosis or neuroforaminal narrowing.  2. Mild retrolisthesis of L5 on S1.           . B12 deficiency [E53.8] 12/21/2012     Borderline low 5 /21/2014     . Chronic renal insufficiency, stage III (moderate) [N18.3] 12/21/2012     5/ 2013     . Hyperlipidemia [E78.5] 09/26/2012   . Cervicalgia [M54.2] 03/11/2010     Radiofrequency ablation of c3 to c 7     . Syncope [R55]    . Hypotension [I95.9]    . Essential hypertension [I10]    . 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 URI (upper respiratory infection) [J06.9]          Past Medical History   Diagnosis Date   . Syncope    . HYPOTENSION     . HYPERTENSION     . Carotid Sinus Hypersensitivity    . URI (upper respiratory infection)    . History of EKG 5/97   . Lipidemia    . Chronic kidney disease    . 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 myelopathy or radiculopathy 05/28/2014       History     Social History   . Marital Status: Married     Spouse Name: N/A     Number of Children: N/A   . Years of Education: N/A     Occupational History   . Not on file.     Social History Main Topics   . Smoking status: Never Smoker    . Smokeless  tobacco: Never Used   . Alcohol Use: Yes   . Drug Use: No   . Sexual Activity: Not on file     Other Topics Concern   . Not on file     Social History Narrative         Family History   Problem Relation Age of Onset   . Colon Cancer Father    . Heart (other) Mother      MI   . Other Family Hx       myelodysplasia       History     Social History   . Marital Status: Married     Spouse Name: N/A     Number of Children: N/A   . Years of Education: N/A     Social History Main Topics   . Smoking status: Never Smoker    . Smokeless tobacco: Never Used   . Alcohol Use: Yes   . Drug Use: No   . Sexual Activity: Not on file     Other Topics  Concern   . Not on file     Social History Narrative       Health Maintenance   Topic Date Due   . COLON CANCER SCREENING,FOBT/FIT  07/07/2002   . CHOLESTEROL- MALE  05/12/2019   . TETANUS BOOSTER  12/22/2022   . ZOSTER VACCINE  Completed   . INFLUENZA VACCINE  Completed   . HEPATITIS C SCREENING  Addressed   . HIV SCREENING  Addressed

## 2014-08-28 NOTE — Telephone Encounter (Signed)
I would like him to try duloxetine  he really does not want to be on higher doses of narcotics and he is taking now Aleve makes his stomach upset so that's not a good idea   and gabapentin gives him flulike symptoms    the narcotic at 1 pill twice a day takes his pain down 50% and I think that we can do better than this  Please call insurance to get prior authorization for duloxetine

## 2014-08-28 NOTE — Progress Notes (Signed)
Does patient have 3 or more complaints:  NO  If yes, discuss with patient the need to schedule a follow up appointment due to limited appointment time.  Follow up appointment scheduled:  NO  Care Everywhere records available/ reconciled:  YES  PHQ2 complete:  NO  PHQ9 complete:  NO  Refills pended:  YES  Orders pended:  NO  Medications to discontinue:  NO    Health Maintenance updated:  YES    Health Maintenance   Topic Date Due   . COLON CANCER SCREENING,FOBT/FIT  07/07/2002   . CHOLESTEROL- MALE  05/12/2019   . TETANUS BOOSTER  12/22/2022   . ZOSTER VACCINE  Completed   . INFLUENZA VACCINE  Completed   . HEPATITIS C SCREENING  Addressed   . HIV SCREENING  Addressed

## 2014-08-28 NOTE — Telephone Encounter (Signed)
PA initiated, will await response from patient's insurance

## 2014-08-28 NOTE — Telephone Encounter (Signed)
Patient added to today's schedule.

## 2014-08-29 MED ORDER — NADOLOL 20 MG OR TABS
ORAL_TABLET | ORAL | Status: DC
Start: 2014-08-29 — End: 2014-10-07

## 2014-10-02 ENCOUNTER — Encounter (INDEPENDENT_AMBULATORY_CARE_PROVIDER_SITE_OTHER): Payer: PPO | Admitting: Internal Medicine

## 2014-10-02 ENCOUNTER — Encounter (INDEPENDENT_AMBULATORY_CARE_PROVIDER_SITE_OTHER): Payer: Self-pay | Admitting: Internal Medicine

## 2014-10-02 NOTE — Telephone Encounter (Signed)
Appointment canceled.

## 2014-10-04 ENCOUNTER — Ambulatory Visit (INDEPENDENT_AMBULATORY_CARE_PROVIDER_SITE_OTHER): Payer: PPO | Admitting: Internal Medicine

## 2014-10-04 ENCOUNTER — Telehealth (INDEPENDENT_AMBULATORY_CARE_PROVIDER_SITE_OTHER): Payer: Self-pay | Admitting: Internal Medicine

## 2014-10-04 ENCOUNTER — Encounter (INDEPENDENT_AMBULATORY_CARE_PROVIDER_SITE_OTHER): Payer: Self-pay | Admitting: Internal Medicine

## 2014-10-04 VITALS — BP 166/96 | HR 66 | Ht 76.0 in | Wt 212.2 lb

## 2014-10-04 DIAGNOSIS — S0101XS Laceration without foreign body of scalp, sequela: Secondary | ICD-10-CM

## 2014-10-04 DIAGNOSIS — M5417 Radiculopathy, lumbosacral region: Secondary | ICD-10-CM

## 2014-10-04 DIAGNOSIS — M542 Cervicalgia: Secondary | ICD-10-CM

## 2014-10-04 MED ORDER — LIDOCAINE 5 % EX PTCH
1.0000 | MEDICATED_PATCH | Freq: Every day | CUTANEOUS | Status: DC
Start: 2014-10-04 — End: 2015-11-25

## 2014-10-04 MED ORDER — DICLOFENAC SODIUM 1 % TD GEL
TRANSDERMAL | Status: DC
Start: 2014-10-04 — End: 2014-10-05

## 2014-10-04 NOTE — Telephone Encounter (Signed)
See if insurance will ok trial of cymbalta again as he has been on narcotics for 4 years  Unable to tolerate gabapentin

## 2014-10-04 NOTE — Progress Notes (Signed)
Ronnie Mason is a 64 year old male     Review of patient's allergies indicates:  Allergies   Allergen Reactions   . Bee Venom Hives, Itching and Swelling   . Gabapentin      Flu like symptoms, diarrhea, body ache upset stomach   . Lidocaine Rash     Lidocaine patch. PATIENT STATES HE IS ALLERGIC TO THE ADHESIVE NOT THE PATCH.       BP 166/96 mmHg  Pulse 66  Ht 6\' 4"  (1.93 m)  Wt 212 lb 4 oz (96.276 kg)  BMI 25.85 kg/m2    Chief Complaint   Patient presents with   . 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 S1   Aleve gives him a stomachache but he does take it now and then-gabapentin gave him flulike symptoms   Insurance previously has denied Cymbalta I'm not quite sure if and where we are with this we request-it is noted I think the missed appointment that he is already been on narcotics at least twice a day for the last 4 years- he takes 1-2 narcotics pills per day and he finds that the pill gives him about 50% relief but he really doesn't like to take more than that-   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 LIPID PANEL   Result Value Ref Range    Cholesterol (Total) 223 (H) <200 mg/dL    Triglyceride 161221 (H) <150 mg/dL    Cholesterol (HDL) 096104 >39 mg/dL    Cholesterol (LDL) 75 <130 mg/dL    Non-HDL Cholesterol 119 0 - 159 mg/dL    Cholesterol/HDL Ratio 2.1     Lipid Panel, Additional Info. (NOTE)    2. CRP, HIGH SENSITIVITY   Result Value Ref Range    CRP, High Sensitivity 0.9 0.0 - 10.0 mg/L   3. ANA COMPREHENSIVE ID PANEL   Result  Value Ref Range    Anti dsDNA (EIA) 1 0 - 14 U/mL    Anti Chromatin Negative NRN    Anti Ribosomal P Negative NRN    Anti Sm Negative NRN    Anti Sm/RNP Negative NRN    Anti RNP Negative NRN    Anti SSA/Ro Negative NRN    Anti SSB/La Negative NRN    Anti Centromere B Negative NRN    Anti Scl 70 Antibody Negative NRN    Anti Jo1 Negative NRN    ANA Interpretation Comment 2       The eleven specific autoantibodies tested in the confirmatory panel are negative.     4. RHEUMATOID FACTOR   Result Value Ref Range    Rheumatoid Factor <13 <13 IU/mL   5. SED RATE   Result Value Ref Range    Erythrocyte Sedimentation Rate 4 0 - 15 mm/hr   6. COMPREHENSIVE METABOLIC PANEL   Result Value Ref Range    Sodium 135 135 -  145 mEq/L    Potassium 4.6 3.6 - 5.2 mEq/L    Chloride 98 98 - 108 mEq/L    Carbon Dioxide, Total 28 22 - 32 mEq/L    Anion Gap 9 4 - 12    Glucose 93 62 - 125 mg/dL    Urea Nitrogen 25 (H) 8 - 21 mg/dL    Creatinine 1.61 0.96 - 1.18 mg/dL    Protein (Total) 7.7 6.0 - 8.2 g/dL    Albumin 4.8 3.5 - 5.2 g/dL    Bilirubin (Total) 1.1 0.2 - 1.3 mg/dL    Calcium 9.9 8.9 - 04.5 mg/dL    AST (GOT) 31 9 - 38 U/L    Alkaline Phosphatase (Total) 71 37 - 159 U/L    ALT (GPT) 26 10 - 48 U/L    GFR, Calc, European American >60 >59 mL/min    GFR, Calc, African American >60 >59 mL/min    GFR, Information       Calculated GFR in mL/min/1.73 m2 by MDRD equation.  Inaccurate with changing renal function.  See [email or web address]   7. CREATINE KINASE TOTAL ACTIVITY   Result Value Ref Range    Creatinine Kinase Total Activity 51 (L) 62 - 325 U/L         MEDICATION PRIOR TO VISIT  Reports   Outpatient Prescriptions Prior to Visit   Medication Sig Dispense Refill   . Atorvastatin Calcium 10 MG Oral Tab Take 1 tablet (10 mg) by mouth daily. 90 tablet 1   . Cyanocobalamin 1000 MCG Oral Tab one per day over-the-counter started 5/21 /2012 this level borderline low 1 Tab 1   . Diclofenac Sodium (VOLTAREN)  1 % Transdermal Gel Apply once daily to neck, trapezius muscle, and low back 1 Tube 5   . EPINEPHrine 0.3 MG/0.3ML Injection Solution Auto-injector Inject as instructed per patient package insert, 0.3 mg intramuscularly or subcutaneously into the thigh, if needed to treat anaphylaxis 2 Autoinjector 0   . Fluticasone Propionate 50 MCG/ACT Nasal Suspension Spray 2 sprays into each nostril daily. 1 Inhaler 11   . Hydrocodone-Acetaminophen 5-325 MG Oral Tab Take 1 tablet by mouth every 6 hours as needed for pain. Assuming control substance management 60 tablet 0   . Hydrocodone-Acetaminophen 5-325 MG Oral Tab Take 1 tablet by mouth every 6 hours as needed. 60 tablet 0   . [START ON 10/27/2014] Hydrocodone-Acetaminophen 5-325 MG Oral Tab Take 1 tablet by mouth every 6 hours as needed. 60 tablet 0   . Ibuprofen 200 MG Oral Tab Take 1 tablet (200 mg) by mouth. Give with food.mid day as needed for neck and back pain     . Lidocaine 5 % External Patch Apply 1 patch onto the skin daily. Apply to painful area for up to 12 hours in a 24 hour period. 30 patch 12   . Lisinopril 40 MG Oral Tab Take 1 tablet (40 mg) by mouth daily. Call pharmacy for refills 1-2 weeks in advance 90 tablet 3   . Nadolol 20 MG Oral Tab TAKE ONE TABLET BY MOUTH ONCE DAILY AS NEEDED FOR TREMOR 30 tablet 0   . Nadolol 40 MG Oral Tab Take 1 tablet (40 mg) by mouth daily. Call pharmacy 1-2 weeks prior for refills. Follow up appt due Oct 2015 90 tablet 3   . Naproxen Sodium 220 MG Oral Cap 2 in am every day for the next 2 weeks with food and see me 1 capsule 1  No facility-administered medications prior to visit.         REVIEW OF SYSTEMS - are otherwise negative unless as noted in HPI above or as additional issues below    Constitutional:   He is working with physical therapy and balance issues           PHYSICAL EXAM  BP 166/96 mmHg  Pulse 66  Ht $Remove .93 m)  Wt 212 lb 4 oz (96.276 kg)  BMI 25.85 kg/m2    Well Dressed    Constitution:  In general  looks:   well , good coloring  No diaphoresis    Patient is Alert  & Attentive  Patient is conversive    17 stitches were taken out of his scalp  This skin looks well-healed    Psychiatric Mental Status / Neurologic Mental Status:  Affect:  Pleasant   Calm  Mood:  Good  Awake: Alert and attentive  Not confused    Neurologic:   Grossly intact  Memory:   Attention           Motor- Gait-coordination: get up and go normal, no gross weakness   Gait- base normal appropriate for age   Get up and go -no problem      Hematologic/Lymphatic /Immunologic:  No gross bruising or rash or hives       IMPRESSION / PLAN / DISCUSSION     Ronnie Mason was seen today for suture removal.    Diagnoses and all orders for this visit:    Neck pain  Orders:  -     Diclofenac Sodium (VOLTAREN) 1 % Transdermal Gel; Apply once daily to neck, trapezius muscle, and low back    Cervicalgia  Comments:   please see Dr Laddie Aquas  Orders:  -     Lidocaine 5 % External Patch; Apply 1 patch onto the skin daily. Apply to painful area for up to 12 hours in a 24 hour period.    Lumbosacral radiculopathy at S1  Orders:  -     Lidocaine 5 % External Patch; Apply 1 patch onto the skin daily. Apply to painful area for up to 12 hours in a 24 hour period.    Laceration of scalp without foreign body, sequela  Comments:   17 stitches out         We'll go ahead and assess insurance again if they will allow Cymbalta  He really does not want to be more than a couple of pills of narcotics per day and he's been on it for 4 years  He uses it judiciously  And he would like to be able to function      MEDICATION as of Discharge  Current Outpatient Prescriptions   Medication Sig Dispense Refill   . Atorvastatin Calcium 10 MG Oral Tab Take 1 tablet (10 mg) by mouth daily. 90 tablet 1   . Cyanocobalamin 1000 MCG Oral Tab one per day over-the-counter started 5/21 /2012 this level borderline low 1 Tab 1   . Diclofenac Sodium (VOLTAREN) 1 % Transdermal Gel Apply once daily to neck,  trapezius muscle, and low back 1 Tube 5   . EPINEPHrine 0.3 MG/0.3ML Injection Solution Auto-injector Inject as instructed per patient package insert, 0.3 mg intramuscularly or subcutaneously into the thigh, if needed to treat anaphylaxis 2 Autoinjector 0   . Fluticasone Propionate 50 MCG/ACT Nasal Suspension Spray 2 sprays into each nostril daily. 1 Inhaler 11   . Hydrocodone-Acetaminophen 5-325 MG Oral Tab  Take 1 tablet by mouth every 6 hours as needed for pain. Assuming control substance management 60 tablet 0   . Hydrocodone-Acetaminophen 5-325 MG Oral Tab Take 1 tablet by mouth every 6 hours as needed. 60 tablet 0   . [START ON 10/27/2014] Hydrocodone-Acetaminophen 5-325 MG Oral Tab Take 1 tablet by mouth every 6 hours as needed. 60 tablet 0   . Ibuprofen 200 MG Oral Tab Take 1 tablet (200 mg) by mouth. Give with food.mid day as needed for neck and back pain     . Lidocaine 5 % External Patch Apply 1 patch onto the skin daily. Apply to painful area for up to 12 hours in a 24 hour period. 30 patch 12   . Lisinopril 40 MG Oral Tab Take 1 tablet (40 mg) by mouth daily. Call pharmacy for refills 1-2 weeks in advance 90 tablet 3   . Nadolol 20 MG Oral Tab TAKE ONE TABLET BY MOUTH ONCE DAILY AS NEEDED FOR TREMOR 30 tablet 0   . Nadolol 40 MG Oral Tab Take 1 tablet (40 mg) by mouth daily. Call pharmacy 1-2 weeks prior for refills. Follow up appt due Oct 2015 90 tablet 3   . Naproxen Sodium 220 MG Oral Cap 2 in am every day for the next 2 weeks with food and see me 1 capsule 1     No current facility-administered medications for this visit.         Risk of taking medication properly and follow up discussed especially to call if problem    Patient   Family Member -   express understanding of care plan/medications    I spent a total time of 15 minutes face-to-face with the patient, of which more than 50% was spent counseling and coordinating care as outlined in this note.    No Follow-up on file.     Cc to following MD:                 ++++++++++++++++++++++++++++++++++++++++++++++++++        Patient Active Problem List    Diagnosis Date Noted   . 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 Tremor [R25.1] 04/20/2013   . Lumbosacral radiculopathy at S1 [M54.17] 03/22/2013     Left     . Fall [W19.XXXA] 02/23/2013     BP < 100  Occasioanal tri[p and leg weakness     . Essential tremor [G25.0] 02/15/2013   . Back pain, lumbosacral [M54.5, M54.89] 02/15/2013   . Lumbar radiculopathy, chronic [M54.16] 02/15/2013   . Neck pain [M54.2] 02/15/2013   . Chronic pain [G89.29] 12/21/2012     Now at [place], DO, Santiago Glad    Neck pain -burn c3-7 on left side per patient  Some trigger injection    Lumbar pain-since 80's better with exercise     . 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 Numbness and tingling of leg [R20.2] 12/21/2012     Left calf -left foot long term suspect from back-suspect L 45     . Chronic back pain [M54.9, G89.29] 12/21/2012     Mri in mid scape 1/214  Multilevel degenerative disc disease of the lumbar spine. Circumferential disc bulge at all levels below and including L1-2. No significant central spinal canal stenosis or neuroforaminal narrowing.  2. Mild retrolisthesis of L5 on S1.           [name]  B12 deficiency [E53.8] 12/21/2012     Borderline low 5 /21/2014     . Chronic renal insufficiency, stage III (moderate) [N18.3] 12/21/2012     5/ 2013     . Hyperlipidemia [E78.5] 09/26/2012   . Cervicalgia [M54.2] 03/11/2010     Radiofrequency ablation of c3 to c 7     . Syncope [R55]    . Hypotension [I95.9]    . Essential hypertension [I10]    . 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 URI (upper respiratory infection) [J06.9]          Past Medical History   Diagnosis Date   . Syncope    . HYPOTENSION     . HYPERTENSION     . Carotid Sinus Hypersensitivity    . URI (upper respiratory infection)    . History of  EKG 5/97   . Lipidemia    . Chronic kidney disease    . 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 myelopathy or radiculopathy 05/28/2014       History     Social History   . Marital Status: Married     Spouse Name: N/A   . Number of Children: N/A   . Years of Education: N/A     Occupational History   . Not on file.     Social History Main Topics   . Smoking status: Never Smoker    . Smokeless tobacco: Never Used   . Alcohol Use: Yes   . Drug Use: No   . Sexual Activity: Not on file     Other Topics Concern   . Not on file     Social History Narrative         Family History   Problem Relation Age of Onset   . Colon Cancer Father    . Heart (other) Mother      MI   . Other Family Hx       myelodysplasia       History     Social History   . Marital Status: Married     Spouse Name: N/A   . Number of Children: N/A   . Years of Education: N/A     Social History Main Topics   . Smoking status: Never Smoker    . Smokeless tobacco: Never Used   . Alcohol Use: Yes   . Drug Use: No   . Sexual Activity: Not on file     Other Topics Concern   . None     Social History Narrative       Health Maintenance   Topic Date Due   . Colon Cancer Screen w/ FOBT/FIT  07/07/2002   . Cholesterol Test  05/12/2019   . Tetanus Vaccine  12/22/2022   . Zoster Vaccine  Completed   . Influenza Vaccine  Completed   . Hepatitis C Screen  Addressed   . HIV Screen  Addressed

## 2014-10-04 NOTE — Progress Notes (Signed)
Does patient have 3 or more complaints:  NO  If yes, discuss with patient the need to schedule a follow up appointment due to limited appointment time.  Follow up appointment scheduled:  NO  Care Everywhere records available/ reconciled:  YES  PHQ2 complete:  NO  PHQ9 complete:  NO  Refills pended:  YES  Orders pended:  NO  Medications to discontinue:  NO    Health Maintenance updated:  YES    Health Maintenance   Topic Date Due   . Colon Cancer Screen w/ FOBT/FIT  07/07/2002   . Cholesterol Test  05/12/2019   . Tetanus Vaccine  12/22/2022   . Zoster Vaccine  Completed   . Influenza Vaccine  Completed   . Hepatitis C Screen  Addressed   . HIV Screen  Addressed

## 2014-10-04 NOTE — Telephone Encounter (Signed)
PA initiated, will await response from patient's insurance

## 2014-10-04 NOTE — Telephone Encounter (Signed)
Pharmacy would like quantity of application for this medication Diclofenac. [facility] [place]

## 2014-10-05 MED ORDER — DICLOFENAC SODIUM 1 % TD GEL
TRANSDERMAL | Status: DC
Start: 2014-10-05 — End: 2014-10-05

## 2014-10-05 MED ORDER — DICLOFENAC SODIUM 1 % TD GEL
4.0000 g | Freq: Four times a day (QID) | TRANSDERMAL | Status: DC
Start: 2014-10-05 — End: 2014-10-05

## 2014-10-05 MED ORDER — DICLOFENAC SODIUM 1 % TD GEL
4.0000 g | Freq: Four times a day (QID) | TRANSDERMAL | Status: DC
Start: 2014-10-05 — End: 2015-11-05

## 2014-10-05 NOTE — Telephone Encounter (Signed)
Changed sig

## 2014-10-05 NOTE — Telephone Encounter (Signed)
Please advise, pharmacy is needing quantity of application for insurance purposes.

## 2014-10-05 NOTE — Addendum Note (Signed)
Addended by: Shann MedalKRAFT, Kuba Shepherd VISESKUL on: 10/05/2014 05:11 PM     Modules accepted: Orders, Medications

## 2014-10-07 ENCOUNTER — Other Ambulatory Visit (INDEPENDENT_AMBULATORY_CARE_PROVIDER_SITE_OTHER): Payer: Self-pay | Admitting: Cardiovascular Disease

## 2014-10-07 DIAGNOSIS — R251 Tremor, unspecified: Secondary | ICD-10-CM

## 2014-10-09 MED ORDER — NADOLOL 20 MG OR TABS
ORAL_TABLET | ORAL | Status: DC
Start: 2014-10-09 — End: 2015-02-14

## 2014-11-29 ENCOUNTER — Telehealth (INDEPENDENT_AMBULATORY_CARE_PROVIDER_SITE_OTHER): Payer: Self-pay | Admitting: Internal Medicine

## 2014-11-29 ENCOUNTER — Ambulatory Visit (INDEPENDENT_AMBULATORY_CARE_PROVIDER_SITE_OTHER): Payer: PPO | Admitting: Internal Medicine

## 2014-11-29 VITALS — BP 154/100 | HR 66 | Ht 76.0 in | Wt 210.0 lb

## 2014-11-29 DIAGNOSIS — G894 Chronic pain syndrome: Secondary | ICD-10-CM

## 2014-11-29 DIAGNOSIS — M542 Cervicalgia: Secondary | ICD-10-CM

## 2014-11-29 DIAGNOSIS — M545 Low back pain, unspecified: Secondary | ICD-10-CM

## 2014-11-29 DIAGNOSIS — R21 Rash and other nonspecific skin eruption: Secondary | ICD-10-CM

## 2014-11-29 MED ORDER — HYDROCODONE-ACETAMINOPHEN 5-325 MG OR TABS
1.0000 | ORAL_TABLET | Freq: Four times a day (QID) | ORAL | Status: DC | PRN
Start: 2014-12-06 — End: 2015-01-17

## 2014-11-29 MED ORDER — CLOBETASOL PROPIONATE 0.05 % EX OINT
TOPICAL_OINTMENT | CUTANEOUS | Status: DC
Start: 2014-11-29 — End: 2015-01-17

## 2014-11-29 MED ORDER — DULOXETINE HCL 30 MG OR CPEP
DELAYED_RELEASE_CAPSULE | ORAL | Status: DC
Start: 2014-11-29 — End: 2014-12-27

## 2014-11-29 MED ORDER — HYDROCODONE-ACETAMINOPHEN 5-325 MG OR TABS
1.0000 | ORAL_TABLET | Freq: Four times a day (QID) | ORAL | Status: DC | PRN
Start: 2015-01-05 — End: 2015-03-07

## 2014-11-29 MED ORDER — HYDROCODONE-ACETAMINOPHEN 5-325 MG OR TABS
1.0000 | ORAL_TABLET | Freq: Four times a day (QID) | ORAL | Status: DC | PRN
Start: 2015-02-04 — End: 2015-01-17

## 2014-11-29 NOTE — Progress Notes (Signed)
Does patient have 3 or more complaints:  NO  If yes, discuss with patient the need to schedule a follow up appointment due to limited appointment time.  Follow up appointment scheduled:  NO  Care Everywhere records available/ reconciled:  YES  PHQ2 complete:  NO  PHQ9 complete:  NO  Refills pended:  YES  Orders pended:  NO  Medications to discontinue:  NO    Health Maintenance updated:  YES    Health Maintenance   Topic Date Due   . Colon Cancer Screen w/ FOBT/FIT  08/03/2004   . Cholesterol Test  05/12/2019   . Tetanus Vaccine  12/22/2022   . Zoster Vaccine  Completed   . Influenza Vaccine  Completed   . Hepatitis C Screen  Addressed   . HIV Screen  Addressed

## 2014-11-29 NOTE — Progress Notes (Addendum)
Ronnie Mason is a 64 year old male     Review of patient's allergies indicates:  Allergies   Allergen Reactions   . Bee Venom Hives, Itching and Swelling   . Gabapentin      Flu like symptoms, diarrhea, body ache upset stomach   . Lidocaine Rash     Lidocaine patch. PATIENT STATES HE IS ALLERGIC TO THE ADHESIVE NOT THE PATCH.       BP 154/100 mmHg  Pulse 66  Ht 6\' 4"  (1.93 m)  Wt 210 lb (95.255 kg)  BMI 25.57 kg/m2  SpO2 97%    Chief Complaint   Patient presents with   . Follow-Up      2 mo fu, medication management       HPI  History of:        Today follow-up above issues:     1 per day hydrocodone and ice back so has a lot of pain meds left ,no h/o addiction , need refill    Pt 2-3 x per week to get balance back and better than 4 months ago    4/10 pain lower back  all the time worst at bed time , most in am when up - 6/7 more the lower back , neck coming around- pt helpful    Doing [place] back and ham strings has been helpful    [employer] will cover- cymbata - h/o gabapentin allergy -which  stomach nausea, tried it twice , h/o lidocaine ok if not the adhesive    Already on narcotics and ibuprofen, no gi side effect - not enough relief   Leg fatigue and he with walk, not any worst than when he saw Dr Miguel Dibble - did go to [place] to try to get injection and at time told he thinks too soon    Pill bottle still has a lot of pills left     Also    Rash back large 3-4 months , itch , rx self with benedryl hydrocortisone   No other areas        Office Visit on 05/11/14   1. LIPID PANEL   Result Value Ref Range    Cholesterol (Total) 223 (H) <200 mg/dL    Triglyceride 161 (H) <150 mg/dL    Cholesterol (HDL) 096 >39 mg/dL    Cholesterol (LDL) 75 <130 mg/dL    Non-HDL Cholesterol 119 0 - 159 mg/dL    Cholesterol/HDL Ratio 2.1     Lipid Panel, Additional Info. (NOTE)    2. CRP, HIGH SENSITIVITY   Result Value Ref Range    CRP, High Sensitivity 0.9 0.0 - 10.0 mg/L   3. ANA COMPREHENSIVE ID PANEL      Result Value Ref Range    Anti dsDNA (EIA) 1 0 - 14 U/mL    Anti Chromatin Negative NRN    Anti Ribosomal P Negative NRN    Anti Sm Negative NRN    Anti Sm/RNP Negative NRN    Anti RNP Negative NRN    Anti SSA/Ro Negative NRN    Anti SSB/La Negative NRN    Anti Centromere B Negative NRN    Anti Scl 70 Antibody Negative NRN    Anti Jo1 Negative NRN    ANA Interpretation Comment 2       The eleven specific autoantibodies tested in the confirmatory panel are negative.     4. RHEUMATOID FACTOR   Result Value Ref Range    Rheumatoid Factor <13 <  13 IU/mL   5. SED RATE   Result Value Ref Range    Erythrocyte Sedimentation Rate 4 0 - 15 mm/hr   6. COMPREHENSIVE METABOLIC PANEL   Result Value Ref Range    Sodium 135 135 - 145 mEq/L    Potassium 4.6 3.6 - 5.2 mEq/L    Chloride 98 98 - 108 mEq/L    Carbon Dioxide, Total 28 22 - 32 mEq/L    Anion Gap 9 4 - 12    Glucose 93 62 - 125 mg/dL    Urea Nitrogen 25 (H) 8 - 21 mg/dL    Creatinine 3.08 6.57 - 1.18 mg/dL    Protein (Total) 7.7 6.0 - 8.2 g/dL    Albumin 4.8 3.5 - 5.2 g/dL    Bilirubin (Total) 1.1 0.2 - 1.3 mg/dL    Calcium 9.9 8.9 - 84.6 mg/dL    AST (GOT) 31 9 - 38 U/L    Alkaline Phosphatase (Total) 71 37 - 159 U/L    ALT (GPT) 26 10 - 48 U/L    GFR, Calc, European American >60 >59 mL/min    GFR, Calc, African American >60 >59 mL/min    GFR, Information       Calculated GFR in mL/min/1.73 m2 by MDRD equation.  Inaccurate with changing renal function.  See [email or web address]   7. CREATINE KINASE TOTAL ACTIVITY   Result Value Ref Range    Creatinine Kinase Total Activity 51 (L) 62 - 325 U/L         MEDICATION PRIOR TO VISIT  Reports   Outpatient Prescriptions Prior to Visit   Medication Sig Dispense Refill   . Atorvastatin Calcium 10 MG Oral Tab Take 1 tablet (10 mg) by mouth daily. 90 tablet 1   . Cyanocobalamin 1000 MCG Oral Tab one per day over-the-counter started 5/21 /2012 this level borderline low 1 Tab 1   . Diclofenac Sodium  (VOLTAREN) 1 % Transdermal Gel Apply 4 g topically 4 times a day. Apply to neck, trapezius muscle, and low back. No more than 4 g applied at a time. 1 Tube 5   . EPINEPHrine 0.3 MG/0.3ML Injection Solution Auto-injector Inject as instructed per patient package insert, 0.3 mg intramuscularly or subcutaneously into the thigh, if needed to treat anaphylaxis 2 Autoinjector 0   . Fluticasone Propionate 50 MCG/ACT Nasal Suspension Spray 2 sprays into each nostril daily. 1 Inhaler 11   . Hydrocodone-Acetaminophen 5-325 MG Oral Tab Take 1 tablet by mouth every 6 hours as needed for pain. Assuming control substance management 60 tablet 0   . Hydrocodone-Acetaminophen 5-325 MG Oral Tab Take 1 tablet by mouth every 6 hours as needed. 60 tablet 0   . Hydrocodone-Acetaminophen 5-325 MG Oral Tab Take 1 tablet by mouth every 6 hours as needed. 60 tablet 0   . Ibuprofen 200 MG Oral Tab Take 1 tablet (200 mg) by mouth. Give with food.mid day as needed for neck and back pain     . Lidocaine 5 % External Patch Apply 1 patch onto the skin daily. Apply to painful area for up to 12 hours in a 24 hour period. 30 patch 12   . Lisinopril 40 MG Oral Tab Take 1 tablet (40 mg) by mouth daily. Call pharmacy for refills 1-2 weeks in advance 90 tablet 3   . Nadolol 20 MG Oral Tab TAKE ONE TABLET BY MOUTH ONCE DAILY AS NEEDED FOR TREMOR 30 tablet 3   . Nadolol 40 MG Oral Tab  Take 1 tablet (40 mg) by mouth daily. Call pharmacy 1-2 weeks prior for refills. Follow up appt due Oct 2015 90 tablet 3   . Naproxen Sodium 220 MG Oral Cap 2 in am every day for the next 2 weeks with food and see me 1 capsule 1     No facility-administered medications prior to visit.         REVIEW OF SYSTEMS - are otherwise negative unless as noted in HPI above or as additional issues below    Constitutional:   doing ok   Eyes:    Head ears, nose mouth throat:     Cardiovascular:   Respiratory:     Gastrointestinal:   Genitourinary:      Musculoskeletal:  Not weak in legs ,  no joint other large joint pain   Integumentary:     Neurological:   Psychiatric/Behavioral:  Has been gardening      Endocrine:     Hematological:   Allergic/Immunologic:        PHYSICAL EXAM  BP 154/100 mmHg  Pulse 66  Ht 6\' 4"  (1.93 m)  Wt 210 lb (95.255 kg)  BMI 25.57 kg/m2  SpO2 97%    Well Dressed    Constitution:  In general looks:   well , good coloring  No diaphoresis    Patient is Alert  & Attentive  Patient is conversive    Psychiatric Mental Status / Neurologic Mental Status:  Affect:  Pleasant   Calm  Mood:  Good  Awake: Alert and attentive  Not confused    Thought::     content : no gross issue   Process : logical  Language:   Speech is fluent  Intact comprehension    Eyes: no discharge, no gross puffiness, non- icteric          Musculoskeletal:  No gross abnormal movement, twitch   No gross atrophy or deformity    Skin:  Isolated to mid thoracic- dollar size well demarcated, flakey raised non infected look- does not look like fungal or bacterial infection  No nail pitting and nothing on scalpe    Neurologic:   Grossly intact  Memory:   Attention       Cranial Nerve    Eyes pupil and lid are intact   II, III, IV, VIII   Facial Movement VII: grossly intact, no facial droop     Motor- Gait-coordination: get up and go normal, no gross weakness       Hematologic/Lymphatic /Immunologic:  No gross bruising or rash or hives       IMPRESSION / PLAN / DISCUSSION     Josmar was seen today for follow-up .    Diagnoses and all orders for this visit:    Bilateral low back pain without sciatica  Orders:  -     Hydrocodone-Acetaminophen 5-325 MG Oral Tab; Take 1 tablet by mouth every 6 hours as needed.    Chronic pain syndrome  Orders:  -     Hydrocodone-Acetaminophen 5-325 MG Oral Tab; Take 1 tablet by mouth every 6 hours as needed.    Cervicalgia  Orders:  -     Hydrocodone-Acetaminophen 5-325 MG Oral Tab; Take 1 tablet by mouth every 6 hours as needed.        history of low back pain and neck pain.   Neck  ok  But back is 4/10 all the time and he would like to get some relief   No  gi issue with NSAID and would like to revisit the option of cymbalta and will send again     Has symptoms now on both legs, he said was like this before but I would like him to see Dr Juanetta Beets     He was reading about gamma knife and other modality and request he discuss this with Dr Sheppard Coil and Dr Amada Jupiter     Rash looks like psoriasis but no other areas   Try changing steroid or option to see derm  See me in 3-4 week s      MEDICATION as of Discharge  Current Outpatient Prescriptions   Medication Sig Dispense Refill   . Atorvastatin Calcium 10 MG Oral Tab Take 1 tablet (10 mg) by mouth daily. 90 tablet 1   . Cyanocobalamin 1000 MCG Oral Tab one per day over-the-counter started 5/21 /2012 this level borderline low 1 Tab 1   . Diclofenac Sodium (VOLTAREN) 1 % Transdermal Gel Apply 4 g topically 4 times a day. Apply to neck, trapezius muscle, and low back. No more than 4 g applied at a time. 1 Tube 5   . EPINEPHrine 0.3 MG/0.3ML Injection Solution Auto-injector Inject as instructed per patient package insert, 0.3 mg intramuscularly or subcutaneously into the thigh, if needed to treat anaphylaxis 2 Autoinjector 0   . Fluticasone Propionate 50 MCG/ACT Nasal Suspension Spray 2 sprays into each nostril daily. 1 Inhaler 11   . Hydrocodone-Acetaminophen 5-325 MG Oral Tab Take 1 tablet by mouth every 6 hours as needed for pain. Assuming control substance management 60 tablet 0   . Hydrocodone-Acetaminophen 5-325 MG Oral Tab Take 1 tablet by mouth every 6 hours as needed. 60 tablet 0   . Hydrocodone-Acetaminophen 5-325 MG Oral Tab Take 1 tablet by mouth every 6 hours as needed. 60 tablet 0   . Ibuprofen 200 MG Oral Tab Take 1 tablet (200 mg) by mouth. Give with food.mid day as needed for neck and back pain     . Lidocaine 5 % External Patch Apply 1 patch onto the skin daily. Apply to painful area for up to 12 hours in a 24 hour period. 30 patch 12    . Lisinopril 40 MG Oral Tab Take 1 tablet (40 mg) by mouth daily. Call pharmacy for refills 1-2 weeks in advance 90 tablet 3   . Nadolol 20 MG Oral Tab TAKE ONE TABLET BY MOUTH ONCE DAILY AS NEEDED FOR TREMOR 30 tablet 3   . Nadolol 40 MG Oral Tab Take 1 tablet (40 mg) by mouth daily. Call pharmacy 1-2 weeks prior for refills. Follow up appt due Oct 2015 90 tablet 3   . Naproxen Sodium 220 MG Oral Cap 2 in am every day for the next 2 weeks with food and see me 1 capsule 1     No current facility-administered medications for this visit.         Risk of taking medication properly and follow up discussed especially to call if problem    Patient   Family Member -   express understanding of care plan/medications    I spent a total time of 25 minutes face-to-face with the patient, of which more than 50% was spent counseling and coordinating care as outlined in this note.    No Follow-up on file.     Cc to following MD:    Krischner             ++++++++++++++++++++++++++++++++++++++++++++++++++  Patient Active Problem List    Diagnosis Date Noted   . 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 Tremor [R25.1] 04/20/2013   . Lumbosacral radiculopathy at S1 [M54.17] 03/22/2013     Left     . Fall [W19.XXXA] 02/23/2013     BP < 100  Occasioanal tri[p and leg weakness     . Essential tremor [G25.0] 02/15/2013   . Back pain, lumbosacral [M54.5, M54.89] 02/15/2013   . Lumbar radiculopathy, chronic [M54.16] 02/15/2013   . Neck pain [M54.2] 02/15/2013   . Chronic pain [G89.29] 12/21/2012     [facility], [facility], [facility] Gladebecca Capen    Neck pain -burn c3-7 on left side per patient  Some trigger injection    Lumbar pain-since 80's better with exercise     . 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 Numbness and tingling of leg [R20.2] 12/21/2012     Left calf -left foot long term suspect from back-suspect L 45     .  Chronic back pain [M54.9, G89.29] 12/21/2012     Mri in mid scape 1/214  Multilevel degenerative disc disease of the lumbar spine. Circumferential disc bulge at all levels below and including L1-2. No significant central spinal canal stenosis or neuroforaminal narrowing.  2. Mild retrolisthesis of L5 on S1.           . B12 deficiency [E53.8] 12/21/2012     Borderline low 5 /21/2014     . Chronic renal insufficiency, stage III (moderate) [N18.3] 12/21/2012     5/ 2013     . Hyperlipidemia [E78.5] 09/26/2012   . Cervicalgia [M54.2] 03/11/2010     Radiofrequency ablation of c3 to c 7     . Syncope [R55]    . Hypotension [I95.9]    . Essential hypertension [I10]    . 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 URI (upper respiratory infection) [J06.9]          Past Medical History   Diagnosis Date   . Syncope    . HYPOTENSION     . HYPERTENSION     . Carotid Sinus Hypersensitivity    . URI (upper respiratory infection)    . History of EKG 5/97   . Lipidemia    . Chronic kidney disease    . 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 myelopathy or radiculopathy 05/28/2014       History     Social History   . Marital Status: Married     Spouse Name: N/A   . Number of Children: N/A   . Years of Education: N/A     Occupational History   . Not on file.     Social History Main Topics   . Smoking status: Never Smoker    . Smokeless tobacco: Never Used   . Alcohol Use: Yes   . Drug Use: No   . Sexual Activity: Not on file     Other Topics Concern   . Not on file     Social History Narrative         Family History   Problem Relation Age of Onset   . Colon Cancer Father    . Heart (other) Mother      MI   . Other Family Hx       myelodysplasia       History  Social History   . Marital Status: Married     Spouse Name: N/A   . Number of Children: N/A   . Years of Education: N/A     Social History Main Topics   . Smoking status: Never Smoker    . Smokeless tobacco: Never Used   . Alcohol Use: Yes   . Drug Use: No   . Sexual Activity:  Not on file     Other Topics Concern   . Not on file     Social History Narrative       Health Maintenance   Topic Date Due   . Colon Cancer Screen w/ FOBT/FIT  08/03/2004   . Cholesterol Test  05/12/2019   . Tetanus Vaccine  12/22/2022   . Zoster Vaccine  Completed   . Influenza Vaccine  Completed   . Hepatitis C Screen  Addressed   . HIV Screen  Addressed

## 2014-11-29 NOTE — Telephone Encounter (Signed)
Please call insurance  Need cymbalta  Already on narcotics, iburofen  Failed gaba

## 2014-12-04 ENCOUNTER — Encounter (INDEPENDENT_AMBULATORY_CARE_PROVIDER_SITE_OTHER): Payer: Self-pay | Admitting: Internal Medicine

## 2014-12-04 NOTE — Telephone Encounter (Signed)
Spoke w/ Patty from patients insurance. Phone: [phone]. Pt does not require prior auth for Cymbalta (Duloxetine)

## 2014-12-27 ENCOUNTER — Ambulatory Visit (INDEPENDENT_AMBULATORY_CARE_PROVIDER_SITE_OTHER): Payer: PPO | Admitting: Internal Medicine

## 2014-12-27 VITALS — BP 130/90 | Temp 98.1°F | Ht 76.0 in | Wt 209.0 lb

## 2014-12-27 DIAGNOSIS — L309 Dermatitis, unspecified: Secondary | ICD-10-CM

## 2014-12-27 DIAGNOSIS — M542 Cervicalgia: Secondary | ICD-10-CM

## 2014-12-27 DIAGNOSIS — M545 Low back pain, unspecified: Secondary | ICD-10-CM

## 2014-12-27 DIAGNOSIS — G894 Chronic pain syndrome: Secondary | ICD-10-CM

## 2014-12-27 DIAGNOSIS — G8929 Other chronic pain: Secondary | ICD-10-CM

## 2014-12-27 MED ORDER — DULOXETINE HCL 60 MG OR CPEP
60.0000 mg | DELAYED_RELEASE_CAPSULE | Freq: Every day | ORAL | Status: DC
Start: 2014-12-27 — End: 2015-07-02

## 2014-12-27 NOTE — Progress Notes (Signed)
Ronnie Mason is a 64 year old male     Review of patient's allergies indicates:  Allergies   Allergen Reactions   . Bee Venom Hives, Itching and Swelling   . Gabapentin      Flu like symptoms, diarrhea, body ache upset stomach   . Lidocaine Rash     Lidocaine patch. PATIENT STATES HE IS ALLERGIC TO THE ADHESIVE NOT THE PATCH.       BP 130/90 mmHg  Temp(Src) 98.1 F (36.7 C) (Oral)  Ht 6\' 4"  (1.93 m)  Wt 209 lb (94.802 kg)  BMI 25.45 kg/m2    Chief Complaint   Patient presents with   . Follow Up Visit       HPI  History of:    Cervical radioablation history of chronic low back pain multiple level degenerative disc disease in the lumbar and also disc bulge without significant stenosis back in MRI in 2014 mild listhesis   In the last month he has been on Cymbalta 60 mg    Today follow-up above issues:    He finds that he gets better going on the Cymbalta than before  And that pain is better controlled in the afternoon  It's not gone but it does help making it more dull  Currently at Cymbalta 60 mg pill    He will be seeing Dr. Miguel Dibble  He is wondering about other modalities for treatment he seen various pads about laser treatment he is hoping that it somehow would help with the neuropathy in his feet        Today he is feeling so much better  He has a follow-up with cardiology pending  Get physical therapy 2 days ago and his back is always better    He needs to get his colonoscopy  But they've been dealing with a sick mother-in-law in [place]  She almost bled out    Social he has not had time to take care of his own physical health with a colonoscopy      Office Visit on 05/11/14   1. LIPID PANEL   Result Value Ref Range    Cholesterol (Total) 223 (H) <200 mg/dL    Triglyceride 161 (H) <150 mg/dL    Cholesterol (HDL) 096 >39 mg/dL    Cholesterol (LDL) 75 <130 mg/dL    Non-HDL Cholesterol 119 0 - 159 mg/dL    Cholesterol/HDL Ratio 2.1     Lipid Panel, Additional Info. (NOTE)    2. CRP, HIGH SENSITIVITY      Result Value Ref Range    CRP, High Sensitivity 0.9 0.0 - 10.0 mg/L   3. ANA COMPREHENSIVE ID PANEL   Result Value Ref Range    Anti dsDNA (EIA) 1 0 - 14 U/mL    Anti Chromatin Negative NRN    Anti Ribosomal P Negative NRN    Anti Sm Negative NRN    Anti Sm/RNP Negative NRN    Anti RNP Negative NRN    Anti SSA/Ro Negative NRN    Anti SSB/La Negative NRN    Anti Centromere B Negative NRN    Anti Scl 70 Antibody Negative NRN    Anti Jo1 Negative NRN    ANA Interpretation Comment 2       The eleven specific autoantibodies tested in the confirmatory panel are negative.     4. RHEUMATOID FACTOR   Result Value Ref Range    Rheumatoid Factor <13 <13 IU/mL   5. SED RATE  Result Value Ref Range    Erythrocyte Sedimentation Rate 4 0 - 15 mm/hr   6. COMPREHENSIVE METABOLIC PANEL   Result Value Ref Range    Sodium 135 135 - 145 mEq/L    Potassium 4.6 3.6 - 5.2 mEq/L    Chloride 98 98 - 108 mEq/L    Carbon Dioxide, Total 28 22 - 32 mEq/L    Anion Gap 9 4 - 12    Glucose 93 62 - 125 mg/dL    Urea Nitrogen 25 (H) 8 - 21 mg/dL    Creatinine 1.61 0.96 - 1.18 mg/dL    Protein (Total) 7.7 6.0 - 8.2 g/dL    Albumin 4.8 3.5 - 5.2 g/dL    Bilirubin (Total) 1.1 0.2 - 1.3 mg/dL    Calcium 9.9 8.9 - 04.5 mg/dL    AST (GOT) 31 9 - 38 U/L    Alkaline Phosphatase (Total) 71 37 - 159 U/L    ALT (GPT) 26 10 - 48 U/L    GFR, Calc, European American >60 >59 mL/min    GFR, Calc, African American >60 >59 mL/min    GFR, Information       Calculated GFR in mL/min/1.73 m2 by MDRD equation.  Inaccurate with changing renal function.  See [email or web address]   7. CREATINE KINASE TOTAL ACTIVITY   Result Value Ref Range    Creatinine Kinase Total Activity 51 (L) 62 - 325 U/L         MEDICATION PRIOR TO VISIT  Reports   Outpatient Prescriptions Prior to Visit   Medication Sig Dispense Refill   . Atorvastatin Calcium 10 MG Oral Tab Take 1 tablet (10 mg) by mouth daily. 90 tablet 1   . Clobetasol Propionate 0.05 %  External Ointment 2x per day small amount to rash for next 2-3 week 30 g 0   . Cyanocobalamin 1000 MCG Oral Tab one per day over-the-counter started 5/21 /2012 this level borderline low 1 Tab 1   . Diclofenac Sodium (VOLTAREN) 1 % Transdermal Gel Apply 4 g topically 4 times a day. Apply to neck, trapezius muscle, and low back. No more than 4 g applied at a time. 1 Tube 5   . DULoxetine HCl 30 MG Oral CAPSULE ENTERIC COATED PARTICLES Start with one pill per day and in one week increase to 2 per day 180 capsule 0   . EPINEPHrine 0.3 MG/0.3ML Injection Solution Auto-injector Inject as instructed per patient package insert, 0.3 mg intramuscularly or subcutaneously into the thigh, if needed to treat anaphylaxis 2 Autoinjector 0   . Fluticasone Propionate 50 MCG/ACT Nasal Suspension Spray 2 sprays into each nostril daily. 1 Inhaler 11   . [START ON 01/05/2015] Hydrocodone-Acetaminophen 5-325 MG Oral Tab Take 1 tablet by mouth every 6 hours as needed. 60 tablet 0   . Hydrocodone-Acetaminophen 5-325 MG Oral Tab Take 1 tablet by mouth every 6 hours as needed for pain. Assuming control substance management 60 tablet 0   . [START ON 02/04/2015] Hydrocodone-Acetaminophen 5-325 MG Oral Tab Take 1 tablet by mouth every 6 hours as needed. 60 tablet 0   . Ibuprofen 200 MG Oral Tab Take 1 tablet (200 mg) by mouth. Give with food.mid day as needed for neck and back pain     . Lidocaine 5 % External Patch Apply 1 patch onto the skin daily. Apply to painful area for up to 12 hours in a 24 hour period. 30 patch 12   . Lisinopril 40 MG Oral  Tab Take 1 tablet (40 mg) by mouth daily. Call pharmacy for refills 1-2 weeks in advance 90 tablet 3   . Nadolol 20 MG Oral Tab TAKE ONE TABLET BY MOUTH ONCE DAILY AS NEEDED FOR TREMOR 30 tablet 3   . Nadolol 40 MG Oral Tab Take 1 tablet (40 mg) by mouth daily. Call pharmacy 1-2 weeks prior for refills. Follow up appt due Oct 2015 90 tablet 3   . Naproxen Sodium 220 MG Oral Cap 2 in am every day for the  next 2 weeks with food and see me 1 capsule 1     No facility-administered medications prior to visit.         REVIEW OF SYSTEMS - are otherwise negative unless as noted in HPI above or as additional issues below    Constitutional:     He is here to follow-up on his rash still on the clobetasol is no longer itching on his back       PHYSICAL EXAM  BP 130/90 mmHg  Temp(Src) 98.1 F (36.7 C) (Oral)  Ht 6\' 4"  (1.93 m)  Wt 209 lb (94.802 kg)  BMI 25.45 kg/m2    Well Dressed    Constitution:  In general looks:   well , good coloring  No diaphoresis    Patient is Alert  & Attentive  Patient is conversive    Psychiatric Mental Status / Neurologic Mental Status:  Affect:  Pleasant   Calm  Mood:  Good  Awake: Alert and attentive  Not confused    Thought::     content : no gross issue   Process : logical  Language:   Speech is fluent  Intact comprehension    Eyes: no discharge, no gross puffiness, non- icteric    Skin:  There is just pigmentation changes the skin is now flat and no longer raised      Neurologic:   Grossly intact  Memory:   Attention       Cranial Nerve    Eyes pupil and lid are intact   II, III, IV, VIII   Facial Movement VII: grossly intact, no facial droop     Motor- Gait-coordination: get up and go normal, no gross weakness   Gait- base normal appropriate for age   Get up and go -no problem       Hematologic/Lymphatic /Immunologic:  No gross bruising or rash or hives       IMPRESSION / PLAN / DISCUSSION     Lawerence was seen today for follow up visit.    Diagnoses and all orders for this visit:    Chronic bilateral low back pain without sciatica  Orders:  -     DULoxetine HCl 60 MG Oral CAPSULE ENTERIC COATED PARTICLES; Take 1 capsule (60 mg) by mouth daily.    Chronic pain syndrome  Orders:  -     DULoxetine HCl 60 MG Oral CAPSULE ENTERIC COATED PARTICLES; Take 1 capsule (60 mg) by mouth daily.    Cervicalgia  Orders:  -     DULoxetine HCl 60 MG Oral CAPSULE ENTERIC COATED PARTICLES; Take 1 capsule  (60 mg) by mouth daily.    Dermatitis  Comments:  taper the clobetasol over the next week once a day and stop, use if itchy short term as neeed       He does feel the Cymbalta is helping at 60 mg with pain control  He does not yet want to increase the dose  to 90 mg  He would like to wait a couple months to see an weight helping stabilize out he will be discussing this also with Dr. Miguel Dibble    He would like to discuss other forms of hopeful treatment    On the clobetasol the rash is improved no longer itching is no longer raised I think she broke the cycle and he should try to taper and come off and use it as needed      MEDICATION as of Discharge  Current Outpatient Prescriptions   Medication Sig Dispense Refill   . Atorvastatin Calcium 10 MG Oral Tab Take 1 tablet (10 mg) by mouth daily. 90 tablet 1   . Clobetasol Propionate 0.05 % External Ointment 2x per day small amount to rash for next 2-3 week 30 g 0   . Cyanocobalamin 1000 MCG Oral Tab one per day over-the-counter started 5/21 /2012 this level borderline low 1 Tab 1   . Diclofenac Sodium (VOLTAREN) 1 % Transdermal Gel Apply 4 g topically 4 times a day. Apply to neck, trapezius muscle, and low back. No more than 4 g applied at a time. 1 Tube 5   . DULoxetine HCl 60 MG Oral CAPSULE ENTERIC COATED PARTICLES Take 1 capsule (60 mg) by mouth daily. 90 capsule 1   . EPINEPHrine 0.3 MG/0.3ML Injection Solution Auto-injector Inject as instructed per patient package insert, 0.3 mg intramuscularly or subcutaneously into the thigh, if needed to treat anaphylaxis 2 Autoinjector 0   . Fluticasone Propionate 50 MCG/ACT Nasal Suspension Spray 2 sprays into each nostril daily. 1 Inhaler 11   . [START ON 01/05/2015] Hydrocodone-Acetaminophen 5-325 MG Oral Tab Take 1 tablet by mouth every 6 hours as needed. 60 tablet 0   . Hydrocodone-Acetaminophen 5-325 MG Oral Tab Take 1 tablet by mouth every 6 hours as needed for pain. Assuming control substance management 60 tablet 0   .  [START ON 02/04/2015] Hydrocodone-Acetaminophen 5-325 MG Oral Tab Take 1 tablet by mouth every 6 hours as needed. 60 tablet 0   . Ibuprofen 200 MG Oral Tab Take 1 tablet (200 mg) by mouth. Give with food.mid day as needed for neck and back pain     . Lidocaine 5 % External Patch Apply 1 patch onto the skin daily. Apply to painful area for up to 12 hours in a 24 hour period. 30 patch 12   . Lisinopril 40 MG Oral Tab Take 1 tablet (40 mg) by mouth daily. Call pharmacy for refills 1-2 weeks in advance 90 tablet 3   . Nadolol 20 MG Oral Tab TAKE ONE TABLET BY MOUTH ONCE DAILY AS NEEDED FOR TREMOR 30 tablet 3   . Nadolol 40 MG Oral Tab Take 1 tablet (40 mg) by mouth daily. Call pharmacy 1-2 weeks prior for refills. Follow up appt due Oct 2015 90 tablet 3   . Naproxen Sodium 220 MG Oral Cap 2 in am every day for the next 2 weeks with food and see me 1 capsule 1     No current facility-administered medications for this visit.         Risk of taking medication properly and follow up discussed especially to call if problem    Patient   Family Member -   express understanding of care plan/medications    I spent a total time of 15 minutes face-to-face with the patient, of which more than 50% was spent counseling and coordinating care as outlined in this note.  No Follow-up on file.     Cc to following MD:                ++++++++++++++++++++++++++++++++++++++++++++++++++        Patient Active Problem List    Diagnosis Date Noted   . 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 Tremor [R25.1] 04/20/2013   . Lumbosacral radiculopathy at S1 [M54.17] 03/22/2013     Left     . Fall [W19.XXXA] 02/23/2013     BP < 100  Occasioanal tri[p and leg weakness     . Essential tremor [G25.0] 02/15/2013   . Back pain, lumbosacral [M54.5, M54.89] 02/15/2013   . Lumbar radiculopathy, chronic [M54.16] 02/15/2013   . Neck pain [M54.2] 02/15/2013   .  Chronic pain [G89.29] 12/21/2012     Now at [facility], DO, Santiago Gladebecca Capen    Neck pain -burn c3-7 on left side per patient  Some trigger injection    Lumbar pain-since 80's better with exercise     . 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 Numbness and tingling of leg [R20.2] 12/21/2012     Left calf -left foot long term suspect from back-suspect L 45     . Chronic back pain [M54.9, G89.29] 12/21/2012     Mri in mid scape 1/214  Multilevel degenerative disc disease of the lumbar spine. Circumferential disc bulge at all levels below and including L1-2. No significant central spinal canal stenosis or neuroforaminal narrowing.  2. Mild retrolisthesis of L5 on S1.           . B12 deficiency [E53.8] 12/21/2012     Borderline low 5 /21/2014     . Chronic renal insufficiency, stage III (moderate) [N18.3] 12/21/2012     5/ 2013     . Hyperlipidemia [E78.5] 09/26/2012   . Cervicalgia [M54.2] 03/11/2010     Radiofrequency ablation of c3 to c 7     . Syncope [R55]    . Hypotension [I95.9]    . Essential hypertension [I10]    . 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 URI (upper respiratory infection) [J06.9]          Past Medical History   Diagnosis Date   . Syncope    . HYPOTENSION     . HYPERTENSION     . Carotid Sinus Hypersensitivity    . URI (upper respiratory infection)    . History of EKG 5/97   . Lipidemia    . Chronic kidney disease    . 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 myelopathy or radiculopathy 05/28/2014       History     Social History   . Marital Status: Married     Spouse Name: N/A   . Number of Children: N/A   . Years of Education: N/A     Occupational History   . Not on file.     Social History Main Topics   . Smoking status: Never Smoker    . Smokeless tobacco: Never Used   . Alcohol Use: Yes   . Drug Use: No   . Sexual Activity: Not on file     Other Topics Concern   . Not on file     Social History Narrative         Family History   Problem Relation Age of Onset   . Colon  Cancer Father    . Heart (other) Mother      MI   . Other Family Hx       myelodysplasia       History     Social History   . Marital Status: Married     Spouse Name: N/A   . Number of Children: N/A   . Years of Education: N/A     Social History Main Topics   . Smoking status: Never Smoker    . Smokeless tobacco: Never Used   . Alcohol Use: Yes   . Drug Use: No   . Sexual Activity: Not on file     Other Topics Concern   . Not on file     Social History Narrative       Health Maintenance   Topic Date Due   . Colonoscopy  08/03/2013   . Diabetes Screen  01/11/2017   . Cholesterol Test  05/12/2019   . Tetanus Vaccine  12/22/2022   . Zoster Vaccine  Completed   . Influenza Vaccine  Completed   . Hepatitis C Screen  Addressed   . HIV Screen  Addressed

## 2015-01-03 ENCOUNTER — Ambulatory Visit (INDEPENDENT_AMBULATORY_CARE_PROVIDER_SITE_OTHER): Payer: PPO | Admitting: Neurology

## 2015-01-03 ENCOUNTER — Encounter (INDEPENDENT_AMBULATORY_CARE_PROVIDER_SITE_OTHER): Payer: Self-pay | Admitting: Neurology

## 2015-01-03 VITALS — BP 144/109 | HR 75 | Ht 76.0 in | Wt 209.0 lb

## 2015-01-03 DIAGNOSIS — G629 Polyneuropathy, unspecified: Secondary | ICD-10-CM

## 2015-01-03 DIAGNOSIS — G894 Chronic pain syndrome: Secondary | ICD-10-CM

## 2015-01-03 DIAGNOSIS — M5417 Radiculopathy, lumbosacral region: Secondary | ICD-10-CM

## 2015-01-03 DIAGNOSIS — G250 Essential tremor: Secondary | ICD-10-CM

## 2015-01-03 NOTE — Patient Instructions (Addendum)
Return for EMG and nerve conduction tests within the next month. Skin should be clean and no lotion applied for 24 hours prior.    Go to the lab for blood testing.

## 2015-01-03 NOTE — Progress Notes (Signed)
[facility]  Physician: Milus Mallick, MD   Phone: [phone]  Today's date: 01/03/2015     HPI/CHIEF COMPLAINT:  Ronnie Mason is a 64 year old who returns for a follow up of low back pain and neck pain and newer complaints of imbalance and unintentional weight loss.     At this time, he is no longer having neck problems however, the lower back pain persists. He reports the back pain continues to radiate down both legs and into the feet. He has had progression and worsening of his symptoms since October 2015.  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     EMG and nerve conduction studies from 2014 are reviewed-these demonstrated asymmetry of sensory nerve action potential amplitudes, with significant reduction on the left relative to the right affecting both sural and superficial peroneal nerves.  There was also evidence of active denervation in the left S1 myotome, correlating well with the patient's symptoms at that time.    Remote examinations are also reviewed and are of note for the presence of a broad station and gait difficulty then.  He also was noted to have had a low normal B12 level in 2014.    The back pain improves when he is active, described as "loosens up". The pain is usually worst in the mornings. He has been in physical therapy once a week for the past 5 years. The benefits of physical therapy usually last the subsequent 3 days. He states that he does his physical therapy home exercises every day. It is difficult for him to exercise aggressively this due to his back pain. However, he tries to walk daily.     He followed up with Dr. Marisa Sprinkles for pain management. He was started on 60 mg of Cymbalta about 5 weeks ago. He is unsure if this is helping him. 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 He also reports he received a left S1 nerve block injection at [facility] in the past. He reports he is due  for a repeat injection. He has a history of a radioablation procedure in his neck.     Additionally, he is having balance problems and had a fall on 09/16/14 while walking his dog. He reports he slipped in his driveway because the ground was wet. He fell and hit the left-side of his head, which required 17 sutures. He reports he could not catch himself due to back pain. He states that the fall did not exacerbate his back pain.     Initially he reports that he drinks 2 beers per week but subsequently when asked again indicated that he drinks about 4-5 beers per week. He denies substance abuse. He denies numbness in the fingers. However, he does have numbness in his toes bilaterally with the right side occuring most recently.     He has lost 16 pounds since February that he states is due to stress. His mother-in-law is terminally ill and is wife as been living with her in [place] instead of at home. He reports he is eating well and does not eat out often.  He is preparing his own food.    Denies difficulty swallowing. He denies choking. Denies slurred speech.He denies illness or flu-like symptoms. He does feel lightheaded and dizzy occasionally. Denies rashes.      He has a history of essential tremor-this is been well managed  with nadolol, that is also used for the management of his hypertension.    He takes supplemental vitamin B12 due to a history of low B12. He reports his history of tremors are stable on nadolol.     He retired in 05/2013. He only had one brother and he committed suicide many years ago.      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      REVIEW OF SYSTEMS:  Neurological: See HPI  Skin: See HPI  HENT: See HPI  Musculoskeletal: See HPI    PHYSICAL EXAM:  Constitutional: This is a well-developed, pleasant male in no acute distress.  BP 144/109 mmHg  Pulse 75  Ht 6\' 4"  (1.93 m)  Wt 209 lb (94.802 kg)  BMI 25.45 kg/m2  Mental Status: Alert and oriented, with intact attention,  concentration, memory, language and fund of knowledge.  Cranial Nerves:  Oculomotor intact. Normal saccades. No facial weakness. No hearing loss. No dysarthria.  Motor:   There is no  incoordination in the limbs. Very broad station and circumduction on the right.  No abnormalities of tone, bulk, or power.  Plantar response on the right was extensor and equivocal on the left.  There is no postural instability.  Muscle bulk is good in the bilateral lower extremities. No atrophy. He has a mild postural tremor. Right ankle reflex is trace. No dysmetria in the bilateral upper or lower extremities.  There is no truncal ataxia  Sensory: Loss of positional sensation bilaterally, worse on the right. Decreased sensation at the left foot. Decreased vibration sensation in the right foot. Decreased proprioception in the toes on the right greater than the left. Normal propioception of the fingers. Normal pain and sensation of the lower extremities. He is fairly stable during Romberg's test.   Extremities: Bilateral pes cavus. He expresses low back pain with a right straight leg raise.   Abdomen: No organomegaly.     ASSESSMENT/PLAN:     ICD-10-CM ICD-9-CM    1. Neuropathy (HCC) G62.9 355.9 COMPREHENSIVE METABOLIC PANEL      CBC, DIFF      VITAMIN B12 (COBALAMIN)      THYROID STIMULATING HORMONE      PROTEIN ELECTR. REFLX PNL      IMMUNOFIXATION   2. Essential tremor G25.0 333.1    3. Chronic pain syndrome G89.4 338.4    4. Lumbosacral radiculopathy at S1 M54.17 724.4      The patient's chronic pain disorder is independent of his other clinical complaints of gait instability and paresthesias.  Gait does appear to have deteriorated in the last 3 years, though on his initial examination in 2014, he had an abnormally broad station.    I have recommended additional electrophysiological testing to look for evidence of progression of neuropathy first noted in 2014.    Alcohol toxicity has previously been observed as the best explanation  for the patient's clinical presentation, though he denies any excessive alcohol use.  Further discussion will be necessary at follow-up.    Given the presence of long tract findings on the right, imaging of the central nervous system may ultimately be warranted.    Patient Instructions   Return for EMG and nerve conduction tests within the next month. Skin should be clean and no lotion applied for 24 hours prior.    Go to the lab for blood testing.      Prior EMR records reviewed as available and clinically relevant. All questions  answered. Discharge and follow up instructions were discussed with the patient and/or accompanying persons.      PROBLEM LIST:  Patient Active Problem List   Diagnosis   . Syncope   . Hypotension   . Essential hypertension   . Carotid Sinus Hypersensitivity   . URI (upper respiratory infection)   . Cervicalgia   . Hyperlipidemia   . Chronic pain   . Bee sting allergy   . Numbness and tingling of leg   . Chronic back pain   . B12 deficiency   . Chronic renal insufficiency, stage III (moderate)   . Essential tremor   . Back pain, lumbosacral   . Lumbar radiculopathy, chronic   . Neck pain   . Fall   . Lumbosacral radiculopathy at S1   . Tremor   . Sensory neuropathy (HCC)   . 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 myelopathy or radiculopathy       PAST MEDICAL HISTORY:  Past Medical History   Diagnosis Date   . Syncope    . HYPOTENSION     . HYPERTENSION     . Carotid Sinus Hypersensitivity    . URI (upper respiratory infection)    . History of EKG 5/97   . Lipidemia    . Chronic kidney disease    . 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 myelopathy or radiculopathy 05/28/2014       SURGICAL HISTORY:  Past Surgical History   Procedure Laterality Date   . Unlisted procedure hands/fingers     . Unlisted procedure femur/knee     . Anes; colonoscopy  2002     repeat in 3 years   . Anes; colonoscopy & polypectomy  11/18/2007     repeat in 3 years   . Unlisted procedure spine  2013     rfa to c3 to  c 7        MEDICATIONS:  Current Outpatient Prescriptions   Medication Sig Dispense Refill   . Atorvastatin Calcium 10 MG Oral Tab Take 1 tablet (10 mg) by mouth daily. 90 tablet 1   . Clobetasol Propionate 0.05 % External Ointment 2x per day small amount to rash for next 2-3 week 30 g 0   . Cyanocobalamin 1000 MCG Oral Tab one per day over-the-counter started 5/21 /2012 this level borderline low 1 Tab 1   . Diclofenac Sodium (VOLTAREN) 1 % Transdermal Gel Apply 4 g topically 4 times a day. Apply to neck, trapezius muscle, and low back. No more than 4 g applied at a time. 1 Tube 5   . DULoxetine HCl 60 MG Oral CAPSULE ENTERIC COATED PARTICLES Take 1 capsule (60 mg) by mouth daily. 90 capsule 1   . EPINEPHrine 0.3 MG/0.3ML Injection Solution Auto-injector Inject as instructed per patient package insert, 0.3 mg intramuscularly or subcutaneously into the thigh, if needed to treat anaphylaxis 2 Autoinjector 0   . Fluticasone Propionate 50 MCG/ACT Nasal Suspension Spray 2 sprays into each nostril daily. 1 Inhaler 11   . [START ON 01/05/2015] Hydrocodone-Acetaminophen 5-325 MG Oral Tab Take 1 tablet by mouth every 6 hours as needed. 60 tablet 0   . [START ON 02/04/2015] Hydrocodone-Acetaminophen 5-325 MG Oral Tab Take 1 tablet by mouth every 6 hours as needed. 60 tablet 0   . Hydrocodone-Acetaminophen 5-325 MG Oral Tab Take 1 tablet by mouth every 6 hours as needed for pain. Assuming control substance management 60 tablet 0   . Ibuprofen 200 MG Oral Tab Take 1  tablet (200 mg) by mouth. Give with food.mid day as needed for neck and back pain     . Lidocaine 5 % External Patch Apply 1 patch onto the skin daily. Apply to painful area for up to 12 hours in a 24 hour period. 30 patch 12   . Lisinopril 40 MG Oral Tab Take 1 tablet (40 mg) by mouth daily. Call pharmacy for refills 1-2 weeks in advance 90 tablet 3   . Nadolol 20 MG Oral Tab TAKE ONE TABLET BY MOUTH ONCE DAILY AS NEEDED FOR TREMOR 30 tablet 3   . Nadolol 40 MG Oral  Tab Take 1 tablet (40 mg) by mouth daily. Call pharmacy 1-2 weeks prior for refills. Follow up appt due Oct 2015 90 tablet 3   . Naproxen Sodium 220 MG Oral Cap 2 in am every day for the next 2 weeks with food and see me 1 capsule 1     No current facility-administered medications for this visit.       ALLERGIES:  Review of patient's allergies indicates:  Allergies   Allergen Reactions   . Bee Venom Hives, Itching and Swelling   . Gabapentin      Flu like symptoms, diarrhea, body ache upset stomach   . Lidocaine Rash     Lidocaine patch. PATIENT STATES HE IS ALLERGIC TO THE ADHESIVE NOT THE PATCH.       FAMILY HISTORY:  Family History   Problem Relation Age of Onset   . Colon Cancer Father    . Heart (other) Mother      MI   . Other Family Hx       myelodysplasia       SOCIAL HISTORY:  History     Social History   . Marital Status: Married     Spouse Name: N/A   . Number of Children: N/A   . Years of Education: N/A     Occupational History   . Not on file.     Social History Main Topics   . Smoking status: Never Smoker    . Smokeless tobacco: Never Used   . Alcohol Use: Yes   . Drug Use: No   . Sexual Activity: Not on file     Other Topics Concern   . Not on file     Social History Narrative         Total time of greater than 40 minutes was spent face-to-face with the patient, of which more than 50% was spent counseling and coordinating care  as outlined in this note.      Portions of this chart were written by Henry RusselMadeline Mercier, Medical Scribe with oversight by Christean GriefMarc A. Kirschner, MD on 01/03/2015  3:13 PM

## 2015-01-03 NOTE — Progress Notes (Signed)
The patient presents today for 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 myelopathy or radiculopathy    Last visit:05/28/2014  Vital complete:  YES  Care Everywhere records available/ reconciled:  YES  PHQ2 complete:  YES  Pharmacy complete: YES   Refills pended:  NO  Medications reviewed:  YES    Medical, surgical, family, and social updated:  YES  Health Maintenance updated:  YES  Recent imaging or hospital visit?:  NO (if yes=pull up images/documents)  Physician added to patient's care team:  YES

## 2015-01-09 ENCOUNTER — Ambulatory Visit: Payer: PPO | Attending: Neurology

## 2015-01-09 DIAGNOSIS — G629 Polyneuropathy, unspecified: Secondary | ICD-10-CM | POA: Insufficient documentation

## 2015-01-09 DIAGNOSIS — G6289 Other specified polyneuropathies: Secondary | ICD-10-CM

## 2015-01-09 LAB — COMPREHENSIVE METABOLIC PANEL
ALT (GPT): 26 U/L (ref 10–48)
AST (GOT): 29 U/L (ref 9–38)
Albumin: 4.7 g/dL (ref 3.5–5.2)
Alkaline Phosphatase (Total): 92 U/L (ref 37–159)
Anion Gap: 9 (ref 4–12)
Bilirubin (Total): 1.2 mg/dL (ref 0.2–1.3)
Calcium: 9.9 mg/dL (ref 8.9–10.2)
Carbon Dioxide, Total: 30 meq/L (ref 22–32)
Chloride: 99 meq/L (ref 98–108)
Creatinine: 1.22 mg/dL — ABNORMAL HIGH (ref 0.51–1.18)
GFR, Calc, African American: >60 mL/min (ref >59)
GFR, Calc, European American: >60 mL/min (ref >59)
Glucose: 106 mg/dL (ref 62–125)
Potassium: 4.4 meq/L (ref 3.6–5.2)
Protein (Total): 7.4 g/dL (ref 6.0–8.2)
Sodium: 138 meq/L (ref 135–145)
Urea Nitrogen: 19 mg/dL (ref 8–21)

## 2015-01-09 LAB — CBC, DIFF
% Basophils: 1 %
% Eosinophils: 3 %
% Immature Granulocytes: 0 %
% Lymphocytes: 29 %
% Monocytes: 12 %
% Neutrophils: 55 %
% Nucleated RBC: 0 %
Absolute Eosinophil Count: 0.12 10*3/uL (ref 0.00–0.50)
Absolute Lymphocyte Count: 1.34 10*3/uL (ref 1.00–4.80)
Basophils: 0.06 10*3/uL (ref 0.00–0.20)
Hematocrit: 48 % (ref 38–50)
Hemoglobin: 16.3 g/dL (ref 13.0–18.0)
Immature Granulocytes: 0.01 10*3/uL (ref 0.00–0.05)
MCH: 35.2 pg — ABNORMAL HIGH (ref 27.3–33.6)
MCHC: 33.7 g/dL (ref 32.2–36.5)
MCV: 105 fL — ABNORMAL HIGH (ref 81–98)
Monocytes: 0.54 10*3/uL (ref 0.00–0.80)
Neutrophils: 2.52 10*3/uL (ref 1.80–7.00)
Nucleated RBC: 0 10*3/uL
Platelet Count: 200 10*3/uL (ref 150–400)
RBC: 4.63 10*6/uL (ref 4.40–5.60)
RDW-CV: 12.9 % (ref 11.6–14.4)
WBC: 4.59 10*3/uL (ref 4.3–10.0)

## 2015-01-09 LAB — THYROID STIMULATING HORMONE: Thyroid Stimulating Hormone: 1.54 u[IU]/mL (ref 0.400–5.000)

## 2015-01-09 LAB — VITAMIN B12 (COBALAMIN): Vitamin B12 (Cobalamin): 589 pg/mL (ref 180–914)

## 2015-01-10 LAB — PROTEIN ELECTR. REFLX PNL
Albumin: 4.8 g/dL (ref 3.5–4.9)
Alpha 1: 0.2 g/dL (ref 0.1–0.3)
Alpha 2: 0.8 g/dL (ref 0.3–0.8)
Beta: 0.7 g/dL (ref 0.6–1.0)
Electrophoresis Interp:: NORMAL
Gamma: 0.9 g/dL (ref 0.4–1.4)
Protein (Total): 7.4 g/dL (ref 6.0–8.2)

## 2015-01-10 LAB — IMMUNOFIXATION

## 2015-01-17 ENCOUNTER — Encounter (INDEPENDENT_AMBULATORY_CARE_PROVIDER_SITE_OTHER): Payer: Self-pay | Admitting: Cardiovascular Disease

## 2015-01-17 ENCOUNTER — Ambulatory Visit (INDEPENDENT_AMBULATORY_CARE_PROVIDER_SITE_OTHER): Payer: PPO | Admitting: Cardiovascular Disease

## 2015-01-17 VITALS — BP 170/118 | HR 64 | Ht 76.0 in | Wt 209.7 lb

## 2015-01-17 DIAGNOSIS — G8929 Other chronic pain: Secondary | ICD-10-CM

## 2015-01-17 DIAGNOSIS — I10 Essential (primary) hypertension: Secondary | ICD-10-CM

## 2015-01-17 DIAGNOSIS — I952 Hypotension due to drugs: Secondary | ICD-10-CM

## 2015-01-17 MED ORDER — AMLODIPINE BESYLATE 2.5 MG OR TABS
2.5000 mg | ORAL_TABLET | Freq: Every day | ORAL | Status: DC
Start: 2015-01-17 — End: 2015-02-28

## 2015-01-17 NOTE — Patient Instructions (Signed)
New medication is amlodipine 2.5 mg
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
## 2015-02-14 ENCOUNTER — Other Ambulatory Visit (INDEPENDENT_AMBULATORY_CARE_PROVIDER_SITE_OTHER): Payer: Self-pay | Admitting: Cardiovascular Disease

## 2015-02-14 DIAGNOSIS — R251 Tremor, unspecified: Secondary | ICD-10-CM

## 2015-02-14 MED ORDER — NADOLOL 20 MG OR TABS
ORAL_TABLET | ORAL | Status: DC
Start: 2015-02-14 — End: 2015-05-08

## 2015-02-15 IMAGING — CR DG LUMBAR SPINE COMPLETE 4+V
5 series · 5 of 5 positions shown · non-contrast
Comparison: None.

CLINICAL DATA: Low back pain, no known injury.

EXAM:
LUMBAR SPINE - COMPLETE 4+ VIEW

[t l-spine a.p.]
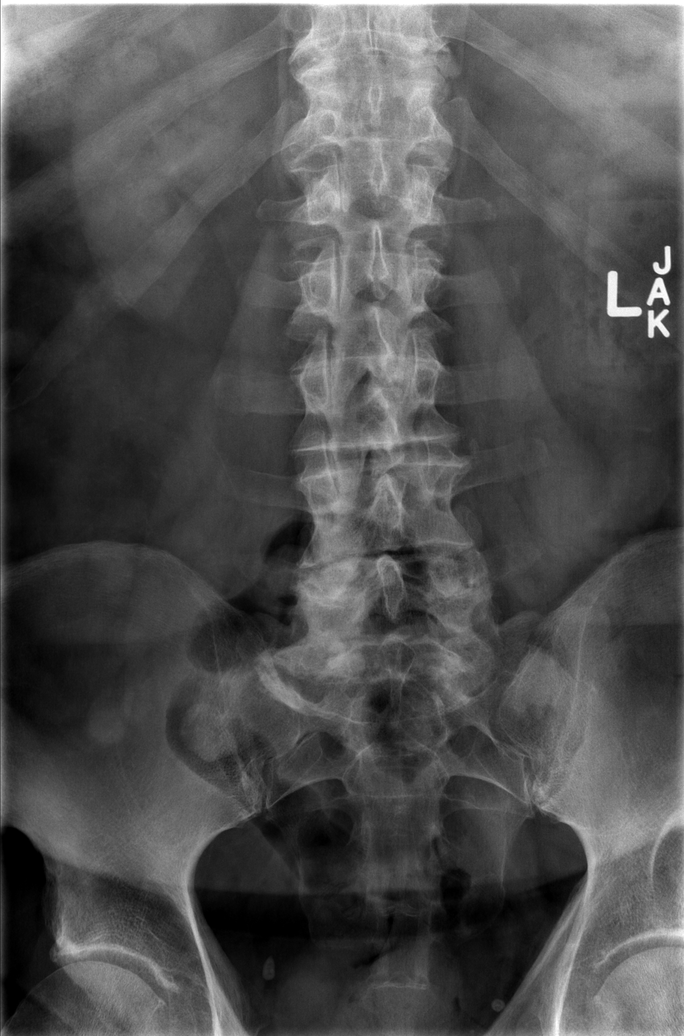

[t l-spine oblique exposure (1 of 2)]
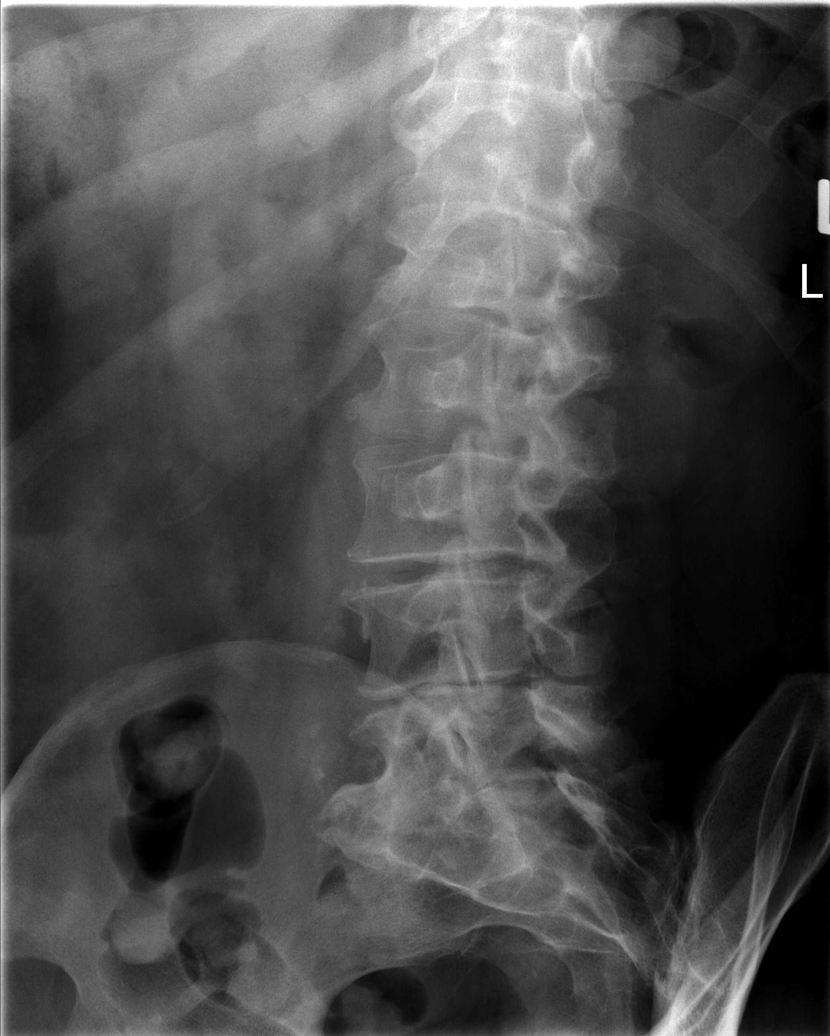

[t l-spine oblique exposure (2 of 2)]
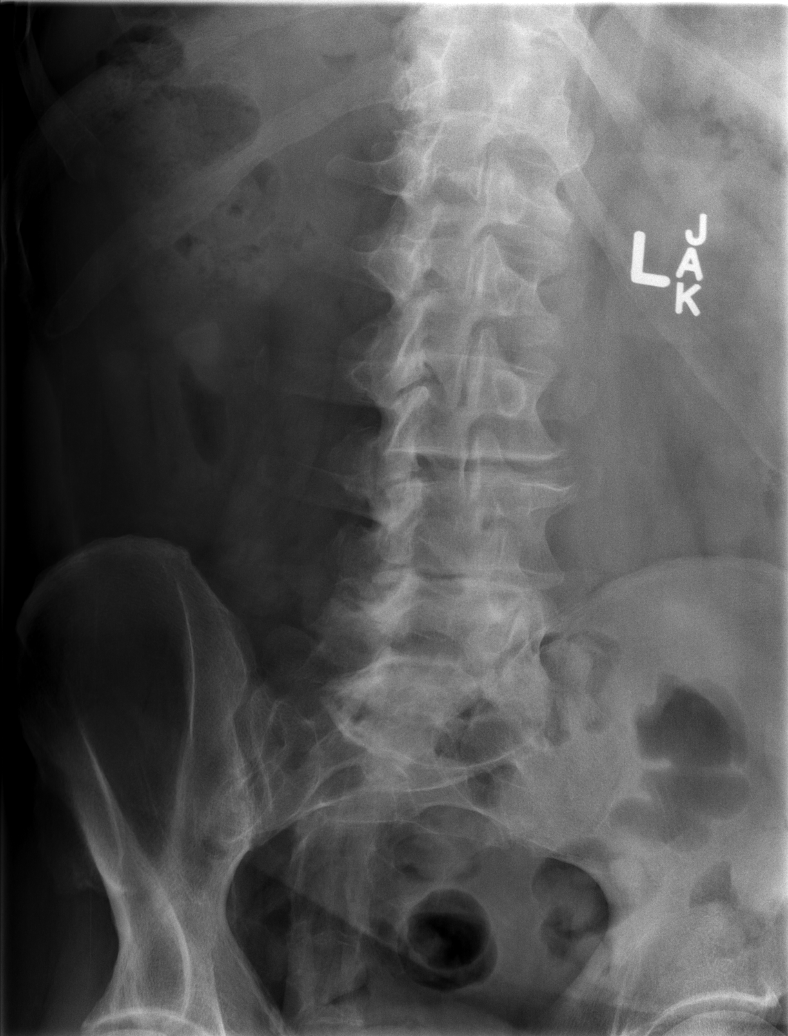

[t l-spine lat]
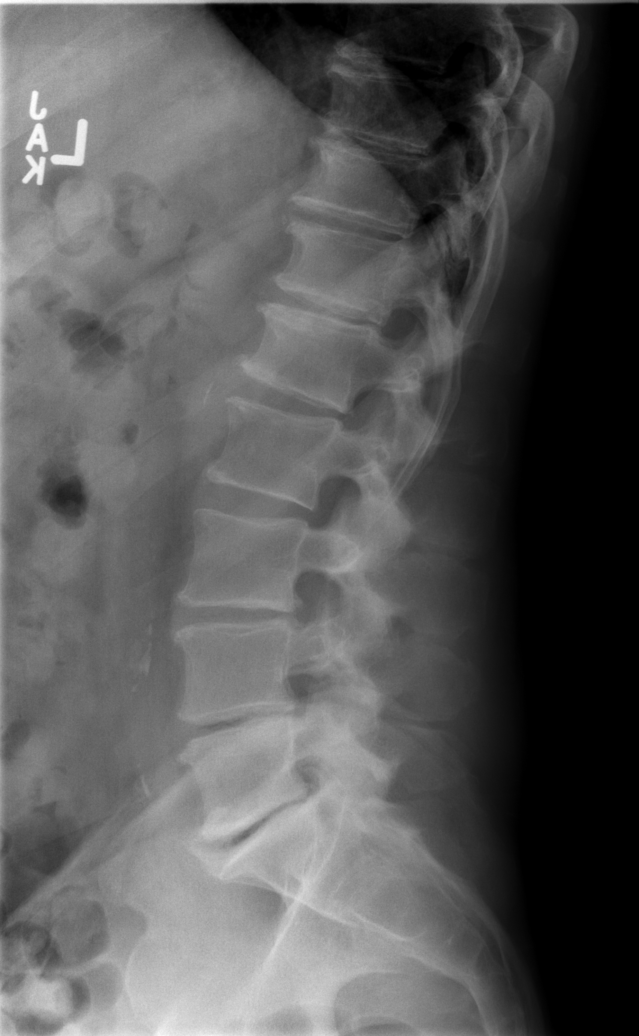

[t l-spine l5-s1 spot]
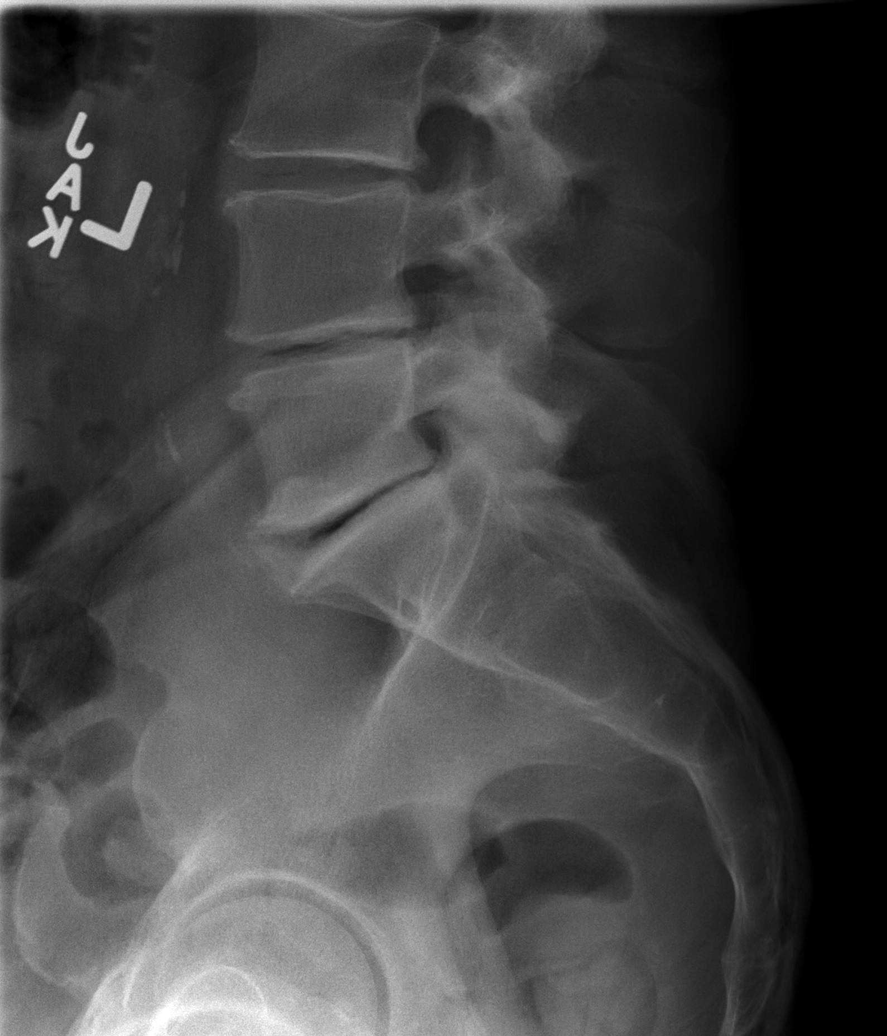

[5 of 5 positions shown; findings below may reference images not displayed]

FINDINGS: Alignment is anatomic. Vertebral body height is maintained.
Multilevel endplate degenerative changes. Findings are worst at L4-5
and L5-S1, where there is also loss of disc space height and vacuum
disc phenomenon. Facet hypertrophy in the lower lumbar spine. No
definite pars defects.
IMPRESSION: Multilevel spondylosis, worst at L4-5 and L5-S1.

## 2015-02-18 ENCOUNTER — Other Ambulatory Visit: Payer: Self-pay | Admitting: Internal Medicine

## 2015-02-18 DIAGNOSIS — E785 Hyperlipidemia, unspecified: Secondary | ICD-10-CM

## 2015-02-18 MED ORDER — ATORVASTATIN CALCIUM 10 MG OR TABS
10.0000 mg | ORAL_TABLET | Freq: Every day | ORAL | Status: DC
Start: 2015-02-18 — End: 2015-07-02

## 2015-02-18 NOTE — Telephone Encounter (Signed)
The patient last received this medication at the requesting pharmacy on 11/07/2014 #90

## 2015-02-19 ENCOUNTER — Encounter (INDEPENDENT_AMBULATORY_CARE_PROVIDER_SITE_OTHER): Payer: PPO | Admitting: Neurology

## 2015-02-22 ENCOUNTER — Ambulatory Visit: Payer: Self-pay | Admitting: Physician Assistant

## 2015-02-22 NOTE — H&P (Signed)
TOTAL HIP ADMISSION H&P  Patient is admitted for right total hip arthroplasty.  Subjective:  Chief Complaint: right hip pain  HPI: Joshua Clark, 64 y.o. male, has a history of pain and functional disability in the right hip(s) due to arthritis and patient has failed non-surgical conservative treatments for greater than 12 weeks to include NSAID's and/or analgesics, corticosteriod injections, use of assistive devices and activity modification.  Onset of symptoms was gradual starting 5 years ago with gradually worsening course since that time.The patient noted no past surgery on the right hip(s).  Patient currently rates pain in the right hip at 10 out of 10 with activity. Patient has night pain, worsening of pain with activity and weight bearing, trendelenberg gait, pain that interfers with activities of daily living, pain with passive range of motion, crepitus and joint swelling. Patient has evidence of subchondral cysts, periarticular osteophytes and joint space narrowing by imaging studies. This condition presents safety issues increasing the risk of falls.  There is no current active infection.  There are no active problems to display for this patient.  Past Medical History  Diagnosis Date  . Depression   . GERD (gastroesophageal reflux disease)   . Anxiety     Past Surgical History  Procedure Laterality Date  . Fracture surgery       (Not in a hospital admission) No Known Allergies  History  Substance Use Topics  . Smoking status: Current Every Day Smoker -- 0.50 packs/day for 5 years  . Smokeless tobacco: Not on file  . Alcohol Use: 0.0 oz/week    4-6 Cans of beer per week    Family History  Problem Relation Age of Onset  . Hypertension Mother      Review of Systems  Musculoskeletal: Positive for joint pain. Negative for falls.  Psychiatric/Behavioral: The patient is nervous/anxious.   All other systems reviewed and are negative.   Objective:  Physical Exam   Constitutional: He is oriented to person, place, and time. He appears well-developed and well-nourished. No distress.  HENT:  Head: Normocephalic and atraumatic.  Nose: Nose normal.  Eyes: Conjunctivae and EOM are normal. Pupils are equal, round, and reactive to light.  Neck: Normal range of motion. Neck supple.  Cardiovascular: Normal rate, regular rhythm, normal heart sounds and intact distal pulses.   Respiratory: Effort normal and breath sounds normal. No respiratory distress. He has no wheezes.  GI: Soft. Bowel sounds are normal. He exhibits no distension. There is no tenderness.  Musculoskeletal:       Right hip: He exhibits decreased range of motion, tenderness, bony tenderness, swelling and crepitus.  Lymphadenopathy:    He has no cervical adenopathy.  Neurological: He is alert and oriented to person, place, and time. No cranial nerve deficit.  Skin: Skin is warm and dry. No Clark noted. No erythema.  Psychiatric: He has a normal mood and affect. His behavior is normal.    Vital signs in last 24 hours: @  Labs:   Estimated body mass index is 27.25 kg/(m^2) as calculated from the following:   Height as of 10/28/12: 6' (1.829 m).   Weight as of 10/28/12: 91.173 kg (201 lb).   Imaging Review Plain radiographs demonstrate severe degenerative joint disease of the right hip(s). The bone quality appears to be good for age and reported activity level.  Assessment/Plan:  End stage arthritis, right hip(s)  The patient history, physical examination, clinical judgement of the provider and imaging studies are consistent with end  stage degenerative joint disease of the right hip(s) and total hip arthroplasty is deemed medically necessary. The treatment options including medical management, injection therapy, arthroscopy and arthroplasty were discussed at length. The risks and benefits of total hip arthroplasty were presented and reviewed. The risks due to aseptic loosening,  infection, stiffness, dislocation/subluxation,  thromboembolic complications and other imponderables were discussed.  The patient acknowledged the explanation, agreed to proceed with the plan and consent was signed. Patient is being admitted for inpatient treatment for surgery, pain control, PT, OT, prophylactic antibiotics, VTE prophylaxis, progressive ambulation and ADL's and discharge planning.The patient is planning to be discharged to skilled nursing facility [facility]

## 2015-02-26 ENCOUNTER — Encounter (INDEPENDENT_AMBULATORY_CARE_PROVIDER_SITE_OTHER): Payer: PPO | Admitting: Internal Medicine

## 2015-02-27 ENCOUNTER — Other Ambulatory Visit (INDEPENDENT_AMBULATORY_CARE_PROVIDER_SITE_OTHER): Payer: Self-pay | Admitting: Cardiovascular Disease

## 2015-02-27 DIAGNOSIS — R251 Tremor, unspecified: Secondary | ICD-10-CM

## 2015-02-27 DIAGNOSIS — G8929 Other chronic pain: Secondary | ICD-10-CM

## 2015-02-27 DIAGNOSIS — I10 Essential (primary) hypertension: Secondary | ICD-10-CM

## 2015-02-27 NOTE — Telephone Encounter (Signed)
Patient has an appointment with you 02/28/15 .  Please address patient's refill requests at the appt for the following medication(s): nadolol .  Please note: Pt on  prn for tremors as well.

## 2015-02-28 ENCOUNTER — Encounter (INDEPENDENT_AMBULATORY_CARE_PROVIDER_SITE_OTHER): Payer: PPO | Admitting: Internal Medicine

## 2015-02-28 ENCOUNTER — Encounter (INDEPENDENT_AMBULATORY_CARE_PROVIDER_SITE_OTHER): Payer: Self-pay | Admitting: Cardiovascular Disease

## 2015-02-28 ENCOUNTER — Ambulatory Visit (INDEPENDENT_AMBULATORY_CARE_PROVIDER_SITE_OTHER): Payer: PPO | Admitting: Cardiovascular Disease

## 2015-02-28 VITALS — BP 102/80 | HR 70 | Ht 76.0 in | Wt 203.0 lb

## 2015-02-28 DIAGNOSIS — G8929 Other chronic pain: Secondary | ICD-10-CM

## 2015-02-28 DIAGNOSIS — I10 Essential (primary) hypertension: Secondary | ICD-10-CM

## 2015-02-28 DIAGNOSIS — I950 Idiopathic hypotension: Secondary | ICD-10-CM

## 2015-02-28 MED ORDER — AMLODIPINE BESYLATE 2.5 MG OR TABS
2.5000 mg | ORAL_TABLET | Freq: Every day | ORAL | Status: DC
Start: 2015-02-28 — End: 2015-07-02

## 2015-02-28 NOTE — Patient Instructions (Addendum)
Take amlodipine before bed    Talk with Dr Miguel Dibble about dose/sresponse with Nadolol

## 2015-02-28 NOTE — Progress Notes (Signed)
Cardiovascular Clinical Evaluation Note    Primary Care Provider: [name], MD    Referring Provider: No ref. provider found    DOB:  Oct 26, 1950    CHIEF COMPLAINT:  Hypertension    HISTORY OF PRESENT ILLNESS:  Ronnie Mason has chronic pain syndrome which appears to be worsening.  He reports more imbalance than before and progressive increase in his leg pain.  His tremor has also worsened.  This is despite 60 mg of nadolol.  On his last visit, blood pressure was elevated and we elected to reintroduce a small additional dose of antihypertensive therapy.  This is against the backdrop of previous hypotensive syncope due to medications.  He was started on amlodipine 2.5 mg daily.  Measurement since that time if shown a low of 86/66 during which he felt poorly but did not have syncope.  Highest blood pressure is 150/100.  Most of the low pressures occur in the afternoon.  This is probably due to the cumulative effects of his morning lisinopril and amlodipine.       PROBLEM LIST:  Patient Active Problem List    Diagnosis Date Noted   . Cervicalgia [M54.2] 03/11/2010     Radiofrequency ablation of c3 to c 7     . Syncope [R55]    . Hypotension [I95.9]    . Essential hypertension [I10]    . 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 URI (upper respiratory infection) [J06.9]    . 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 Tremor [R25.1] 04/20/2013   . Lumbosacral radiculopathy at S1 [M54.17] 03/22/2013     Left     . Fall [W19.XXXA] 02/23/2013     BP < 100  Occasioanal tri[p and leg weakness     . Essential tremor [G25.0] 02/15/2013   . Back pain, lumbosacral [M54.5, M54.89] 02/15/2013   . Lumbar radiculopathy, chronic [M54.16] 02/15/2013   . Neck pain [M54.2] 02/15/2013   . Chronic pain [G89.29] 12/21/2012     Now at [facility], DO, Santiago Glad    Neck pain -burn c3-7 on left side per patient  Some trigger  injection    Lumbar pain-since 80's better with exercise     . 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 Numbness and tingling of leg [R20.2] 12/21/2012     Left calf -left foot long term suspect from back-suspect L 45     . Chronic back pain [M54.9, G89.29] 12/21/2012     Mri in mid scape 1/214  Multilevel degenerative disc disease of the lumbar spine. Circumferential disc bulge at all levels below and including L1-2. No significant central spinal canal stenosis or neuroforaminal narrowing.  2. Mild retrolisthesis of L5 on S1.           . B12 deficiency [E53.8] 12/21/2012     Borderline low 5 /21/2014     . Chronic renal insufficiency, stage III (moderate) [N18.3] 12/21/2012     5/ 2013     . Hyperlipidemia [E78.5] 09/26/2012       MEDICATIONS:    Current Outpatient Prescriptions   Medication Sig Dispense Refill   . AmLODIPine Besylate 2.5 MG Oral Tab Take 1 tablet (2.5 mg) by mouth daily. 90 tablet 3   . Atorvastatin Calcium 10 MG Oral Tab Take 1 tablet (10 mg) by mouth daily. 90 tablet 1   . Cyanocobalamin  1000 MCG Oral Tab one per day over-the-counter started 5/21 /2012 this level borderline low 1 Tab 1   . Diclofenac Sodium (VOLTAREN) 1 % Transdermal Gel Apply 4 g topically 4 times a day. Apply to neck, trapezius muscle, and low back. No more than 4 g applied at a time. 1 Tube 5   . DULoxetine HCl 60 MG Oral CAPSULE ENTERIC COATED PARTICLES Take 1 capsule (60 mg) by mouth daily. 90 capsule 1   . EPINEPHrine 0.3 MG/0.3ML Injection Solution Auto-injector Inject as instructed per patient package insert, 0.3 mg intramuscularly or subcutaneously into the thigh, if needed to treat anaphylaxis 2 Autoinjector 0   . Hydrocodone-Acetaminophen 5-325 MG Oral Tab Take 1 tablet by mouth every 6 hours as needed. 60 tablet 0   . Ibuprofen 200 MG Oral Tab Take 1 tablet (200 mg) by mouth. Give with food.mid day as needed for neck and back pain     . Lidocaine 5 % External Patch Apply 1 patch onto the skin daily.  Apply to painful area for up to 12 hours in a 24 hour period. 30 patch 12   . Lisinopril 40 MG Oral Tab Take 1 tablet (40 mg) by mouth daily. Call pharmacy for refills 1-2 weeks in advance 90 tablet 3   . Nadolol 20 MG Oral Tab TAKE ONE TABLET BY MOUTH ONCE DAILY AS NEEDED FOR TREMOR 30 tablet 2   . Nadolol 40 MG Oral Tab Take 1 tablet (40 mg) by mouth daily. Call pharmacy 1-2 weeks prior for refills. Follow up appt due Oct 2015 90 tablet 3     No current facility-administered medications for this visit.       ALLERGIES:  Bee venom; Gabapentin; and Lidocaine      PSFH: No hospitalizations or emergency room visits.      REVIEW OF SYSTEMS:  See Cardiovascular HPI for pertinent positives and negatives.   RESPIRATORY:  No sputum, hemoptysis.    PHYSICAL EXAM    CONSTITUTIONAL:  This is a well-nourished male in no distress.   BP 102/80 mmHg  Pulse 70  Ht  (1.93 m)  Wt 203 lb (92.08 kg)  BMI 24.72 kg/m2  NECK:  Jugular venous pressure normal.  Thyroid is not enlarged.  RESPIRATORY:   Clear to percussion and auscultation.  CARDIOVASCULAR:  Regularhm.  S1 and S2 normal. No S3, S4, no murmur.    EXTREMITIES:  No clubbing, cyanosis, edema or tenderness noted.      IMPRESSION :   1.  Hypertension: This is at least partially due to his pain syndrome.  2.  Hypotension secondary to medications  3.  Chronic neuropathy     PLAN:    He is going to switch the amlodipine to the evening.  Hopefully by dispersing the timing of the medication we can prevent summation of affects as I do not want his blood pressure dropping below 90.      Electronically signed by Eilleen Kempf, MD    02/28/2015  12:50 PM    CC:   [name], MD  No ref. provider found

## 2015-03-04 ENCOUNTER — Telehealth (INDEPENDENT_AMBULATORY_CARE_PROVIDER_SITE_OTHER): Payer: Self-pay | Admitting: Cardiovascular Disease

## 2015-03-04 ENCOUNTER — Inpatient Hospital Stay (HOSPITAL_COMMUNITY)
Admission: RE | Admit: 2015-03-04 | Discharge: 2015-03-04 | Disposition: A | Discharge status: Home / Self Care | Payer: No Typology Code available for payment source | Source: Ambulatory Visit

## 2015-03-04 DIAGNOSIS — I10 Essential (primary) hypertension: Secondary | ICD-10-CM

## 2015-03-04 DIAGNOSIS — R251 Tremor, unspecified: Secondary | ICD-10-CM

## 2015-03-04 MED ORDER — NADOLOL 40 MG OR TABS
40.0000 mg | ORAL_TABLET | Freq: Every day | ORAL | Status: DC
Start: 2015-03-04 — End: 2015-03-13

## 2015-03-04 NOTE — Pre-Procedure Instructions (Signed)
    Joshua Clark  03/04/2015      Your procedure is scheduled on : Friday, August 12.  Report to [facility] Admitting at 8:25 A.M.               Your surgery is scheduled for 10:35 AM.   Call this number if you have problems the morning of surgery:  [phone]                 For any other questions, please call [phone], Monday - Friday 8 AM - 4 PM.   Remember:  Do not eat food or drink liquids after midnight Thursday, August 11.  Take these medicines the morning of surgery with A SIP OF WATER:  venlafaxine XR (EFFEXOR-XR).               Take if needed: traMADol [name].                 On Friday, August 5 STOP taking : traMADol [name]  Do not wear jewelry, make-up or nail polish.  Do not wear lotions, powders, or perfumes.   Men may shave face and neck.  Do not bring valuables to the hospital.  [facility] is not responsible for any belongings or valuables.  Contacts, dentures or bridgework may not be worn into surgery.  Leave your suitcase in the car.  After surgery it may be brought to your room.  For patients admitted to the hospital, discharge time will be determined by your treatment team.   Special instructions:  Review  [facility] - Preparing For Surgery.  Please read over the following fact sheets that you were given. Pain Booklet, Coughing and Deep Breathing, Blood Transfusion Information and Surgical Site Infection Prevention and Incentive Spirometery

## 2015-03-04 NOTE — Telephone Encounter (Signed)
-----   Message from Cristy Hilts sent at 03/04/2015  2:33 PM PDT -----    Has patient already contacted Pharmacy [address]):  yes  ______________________    Pharmacy (if multiples):  [id number] [address] [place] [phone]  ______________________    Medication:  Nadolol   ______________________    Pill Size (mg, mEq):  20 MG and 40 mg  ______________________    How often (daily, twice a day): TAKE ONE TABLET BY MOUTH ONCE DAILY AS NEEDED FOR TREMOR, Take 1 tablet (40 mg) by mouth daily  ______________________    Other Comments:  Pt is out of both

## 2015-03-05 ENCOUNTER — Encounter (HOSPITAL_COMMUNITY): Payer: Self-pay

## 2015-03-05 ENCOUNTER — Encounter (HOSPITAL_COMMUNITY)
Admission: RE | Admit: 2015-03-05 | Discharge: 2015-03-05 | Disposition: A | Discharge status: Home / Self Care | Payer: 59 | Source: Ambulatory Visit | Attending: Physician Assistant | Admitting: Physician Assistant

## 2015-03-05 ENCOUNTER — Encounter (HOSPITAL_COMMUNITY)
Admission: RE | Admit: 2015-03-05 | Discharge: 2015-03-05 | Disposition: A | Discharge status: Home / Self Care | Payer: 59 | Source: Ambulatory Visit | Attending: Orthopedic Surgery | Admitting: Orthopedic Surgery

## 2015-03-05 DIAGNOSIS — M1611 Unilateral primary osteoarthritis, right hip: Secondary | ICD-10-CM

## 2015-03-05 DIAGNOSIS — Z01818 Encounter for other preprocedural examination: Secondary | ICD-10-CM | POA: Insufficient documentation

## 2015-03-05 DIAGNOSIS — K219 Gastro-esophageal reflux disease without esophagitis: Secondary | ICD-10-CM | POA: Insufficient documentation

## 2015-03-05 DIAGNOSIS — Z79899 Other long term (current) drug therapy: Secondary | ICD-10-CM | POA: Diagnosis not present

## 2015-03-05 DIAGNOSIS — Z01812 Encounter for preprocedural laboratory examination: Secondary | ICD-10-CM | POA: Insufficient documentation

## 2015-03-05 DIAGNOSIS — Z87891 Personal history of nicotine dependence: Secondary | ICD-10-CM | POA: Diagnosis not present

## 2015-03-05 DIAGNOSIS — Z0183 Encounter for blood typing: Secondary | ICD-10-CM | POA: Diagnosis not present

## 2015-03-05 DIAGNOSIS — R9431 Abnormal electrocardiogram [ECG] [EKG]: Secondary | ICD-10-CM | POA: Insufficient documentation

## 2015-03-05 HISTORY — DX: Unspecified osteoarthritis, unspecified site: M19.90

## 2015-03-05 HISTORY — DX: Other intervertebral disc degeneration, lumbar region: M51.36

## 2015-03-05 HISTORY — DX: Other intervertebral disc degeneration, lumbar region without mention of lumbar back pain or lower extremity pain: M51.369

## 2015-03-05 LAB — COMPREHENSIVE METABOLIC PANEL
ALT: 23 U/L (ref 17–63)
AST: 21 U/L (ref 15–41)
Albumin: 3.8 g/dL (ref 3.5–5.0)
Alkaline Phosphatase: 102 U/L (ref 38–126)
Anion gap: 5 (ref 5–15)
BUN: 29 mg/dL — AB (ref 6–20)
CALCIUM: 9.3 mg/dL (ref 8.9–10.3)
CO2: 29 mmol/L (ref 22–32)
Chloride: 106 mmol/L (ref 101–111)
Creatinine, Ser: 0.93 mg/dL (ref 0.61–1.24)
GFR calc Af Amer: >60 mL/min (ref >60)
GLUCOSE: 108 mg/dL — AB (ref 65–99)
POTASSIUM: 4.5 mmol/L (ref 3.5–5.1)
SODIUM: 140 mmol/L (ref 135–145)
TOTAL PROTEIN: 6.7 g/dL (ref 6.5–8.1)
Total Bilirubin: 0.4 mg/dL (ref 0.3–1.2)

## 2015-03-05 LAB — URINALYSIS, ROUTINE W REFLEX MICROSCOPIC
GLUCOSE, UA: NEGATIVE mg/dL
HGB URINE DIPSTICK: NEGATIVE
Ketones, ur: 15 mg/dL — AB
Leukocytes, UA: NEGATIVE
Nitrite: NEGATIVE
PH: 5.5 (ref 5.0–8.0)
PROTEIN: NEGATIVE mg/dL
SPECIFIC GRAVITY, URINE: 1.031 — AB (ref 1.005–1.030)
Urobilinogen, UA: 1 mg/dL (ref 0.0–1.0)

## 2015-03-05 LAB — TYPE AND SCREEN
ABO/RH(D): O POS
ANTIBODY SCREEN: NEGATIVE

## 2015-03-05 LAB — CBC WITH DIFFERENTIAL/PLATELET
BASOS ABS: 0.1 10*3/uL (ref 0.0–0.1)
Basophils Relative: 1 % (ref 0–1)
Eosinophils Absolute: 0.6 10*3/uL (ref 0.0–0.7)
Eosinophils Relative: 5 % (ref 0–5)
HCT: 45.6 % (ref 39.0–52.0)
Hemoglobin: 15.1 g/dL (ref 13.0–17.0)
LYMPHS ABS: 2.2 10*3/uL (ref 0.7–4.0)
Lymphocytes Relative: 21 % (ref 12–46)
MCH: 29.5 pg (ref 26.0–34.0)
MCHC: 33.1 g/dL (ref 30.0–36.0)
MCV: 89.2 fL (ref 78.0–100.0)
Monocytes Absolute: 1.4 10*3/uL — ABNORMAL HIGH (ref 0.1–1.0)
Monocytes Relative: 13 % — ABNORMAL HIGH (ref 3–12)
NEUTROS ABS: 6.2 10*3/uL (ref 1.7–7.7)
NEUTROS PCT: 60 % (ref 43–77)
Platelets: 269 10*3/uL (ref 150–400)
RBC: 5.11 MIL/uL (ref 4.22–5.81)
RDW: 13.1 % (ref 11.5–15.5)
WBC: 10.3 10*3/uL (ref 4.0–10.5)

## 2015-03-05 LAB — PROTIME-INR
INR: 1 (ref 0.00–1.49)
Prothrombin Time: 13.4 seconds (ref 11.6–15.2)

## 2015-03-05 LAB — SURGICAL PCR SCREEN
MRSA, PCR: NEGATIVE
Staphylococcus aureus: NEGATIVE

## 2015-03-05 LAB — ABO/RH: ABO/RH(D): O POS

## 2015-03-05 LAB — APTT: aPTT: 25 seconds (ref 24–37)

## 2015-03-05 MED ORDER — CHLORHEXIDINE GLUCONATE 4 % EX LIQD: 60.0000 mL | Freq: Once | CUTANEOUS | Status: DC

## 2015-03-05 NOTE — Progress Notes (Signed)
Patient states that he does not want any visitors or calls from family or friends while visiting.  Contacted admissions to make patient a XXX.

## 2015-03-05 NOTE — Pre-Procedure Instructions (Signed)
    Joshua Clark  03/05/2015      [facility] [address] [place], [place] - [address] AT [facility] [address] [place] [address] Phone: [phone] Fax: [phone]  [facility] - [place], [place] - [place] [address] [place] [address] Phone: [phone] Fax: [phone]    Your procedure is scheduled on Friday, August 12th, 2016.  Report to [facility] Admitting at 8:30 A.M.  Call this number if you have problems the morning of surgery:  [phone]   Remember:  Do not eat food or drink liquids after midnight.   Take these medicines the morning of surgery with A SIP OF WATER: Effexor-XR, Tramadol (Ultram) if needed  Stop taking the following 7 days prior to surgery: Meloxicam (Mobic), NSAIDS, Aspirin, Aleve, Naproxen, Fish Oil, all herbal medications, and all vitamins.    Do not wear jewelry.  Do not wear lotions, powders, or colgones.  You may wear deodorant.  Men may shave face and neck.  Do not bring valuables to the hospital.  [facility] is not responsible for any belongings or valuables.  Contacts, dentures or bridgework may not be worn into surgery.  Leave your suitcase in the car.  After surgery it may be brought to your room.  For patients admitted to the hospital, discharge time will be determined by your treatment team.  Patients discharged the day of surgery will not be allowed to drive home.   Special instructions:  See attached.   Please read over the following fact sheets that you were given. Pain Booklet, Coughing and Deep Breathing, Blood Transfusion Information, Total Joint Packet, MRSA Information and Surgical Site Infection Prevention

## 2015-03-05 NOTE — Progress Notes (Signed)
PCP- Dr. Abigail Miyamoto Cardiologist - denies  EKG- 03/05/15 - Epic CXR - 03/05/15 - Epic Cardiac Cath/Stress Test/Echo - denies

## 2015-03-06 ENCOUNTER — Encounter (HOSPITAL_COMMUNITY): Payer: Self-pay

## 2015-03-06 LAB — URINE CULTURE

## 2015-03-06 NOTE — Progress Notes (Addendum)
Anesthesia Chart Review: Patient is a 64 year old male scheduled for right THA on 03/15/15 by Dr. Madelon Lips. Patient made "XXX" per PAT/Admission because he reported not wanting to be contacted from family or friends during his hospitalization. Case is posted for [place].  History includes former smoker, GERD, depression, anxiety, DDD. P[facility] Patient reports recent pre-operative evaluation by Amy PA. He was contacted by her yesterday following review of his EKG and is being referred to Dr. Jacinto Halim with [facility].  Appointment is on 03/07/15.   Meds include melatonin, Mobic, Ultram, Effexor-XR.  03/05/15 EKG: NSR, LAD, non-specific intra-ventricular conduction delay, possible inferior infarct (age undetermined)--per Dr. Gala Romney, new since previous tracing (03/13/05).   03/05/15 CXR: IMPRESSION: No active cardiopulmonary disease. Slight hyper aeration may indicate mild emphysematous change.  Normal spirometry on 10/28/12.  Preoperative labs noted.   Additional input following cardiology recommendations.  Velna Ochs [facility] Short Stay Center/Anesthesiology Phone [phone] 03/06/2015 5:11 PM  Addendum: Cardiology clearance received from [facility]. He was seen by Marcy Salvo, AGNP-C on 03/07/15. By notes, 03/07/15 EKG showed NSR, LAD, LAFB, incomplete right BBB. Based on findings at that visit, she writes, ".Marland KitchenMarland KitchenHence, from cardiac standpoint I do not think that he is at high risk for undergoing moderate risk surgery, he can be taken up for the upcoming surgery with acceptable cardiovascular risk..." PRN cardiology follow-up was recommended.  Velna Ochs [facility] Short Stay Center/Anesthesiology Phone [phone] 03/12/2015 5:06 PM

## 2015-03-07 ENCOUNTER — Ambulatory Visit (INDEPENDENT_AMBULATORY_CARE_PROVIDER_SITE_OTHER): Payer: PPO | Admitting: Internal Medicine

## 2015-03-07 VITALS — BP 140/104 | HR 74 | Ht 74.25 in | Wt 206.0 lb

## 2015-03-07 DIAGNOSIS — G894 Chronic pain syndrome: Secondary | ICD-10-CM

## 2015-03-07 DIAGNOSIS — M545 Low back pain, unspecified: Secondary | ICD-10-CM

## 2015-03-07 DIAGNOSIS — M542 Cervicalgia: Secondary | ICD-10-CM

## 2015-03-07 DIAGNOSIS — G8929 Other chronic pain: Secondary | ICD-10-CM

## 2015-03-07 MED ORDER — HYDROCODONE-ACETAMINOPHEN 5-325 MG OR TABS
1.0000 | ORAL_TABLET | Freq: Four times a day (QID) | ORAL | Status: DC | PRN
Start: 2015-05-06 — End: 2015-06-05

## 2015-03-07 MED ORDER — HYDROCODONE-ACETAMINOPHEN 5-325 MG OR TABS
1.0000 | ORAL_TABLET | Freq: Four times a day (QID) | ORAL | Status: DC | PRN
Start: 2015-04-06 — End: 2015-07-02

## 2015-03-07 MED ORDER — HYDROCODONE-ACETAMINOPHEN 5-325 MG OR TABS
1.0000 | ORAL_TABLET | Freq: Four times a day (QID) | ORAL | Status: DC | PRN
Start: 2015-03-07 — End: 2015-07-02

## 2015-03-07 MED ORDER — HYDROCODONE-ACETAMINOPHEN 5-325 MG OR TABS
1.0000 | ORAL_TABLET | Freq: Four times a day (QID) | ORAL | Status: DC | PRN
Start: 2015-04-06 — End: 2015-03-07

## 2015-03-07 NOTE — Progress Notes (Signed)
Does patient have 3 or more complaints:  NO  If yes, discuss with patient the need to schedule a follow up appointment due to limited appointment time.  Follow up appointment scheduled:  NO  Care Everywhere records available/ reconciled:  YES  PHQ2 complete:  NO  PHQ9 complete:  NO  Refills pended:  NO  Orders pended:  YES  Medications to discontinue:   NO  Health Maintenance updated:  YES    Health Maintenance   Topic Date Due   . Colonoscopy  08/03/2013   . Influenza Vaccine (1) 04/04/2015   . Cholesterol Test  05/12/2019   . Tetanus Vaccine  12/22/2022   . Zoster Vaccine  Completed   . Hepatitis C Screen  Addressed   . HIV Screen  Addressed

## 2015-03-07 NOTE — Progress Notes (Signed)
Ronnie Mason is a 64 year old male     Review of patient's allergies indicates:  Allergies   Allergen Reactions   . Bee Venom Hives, Itching and Swelling   . Gabapentin      Flu like symptoms, diarrhea, body ache upset stomach   . Lidocaine Rash     Lidocaine patch. PATIENT STATES HE IS ALLERGIC TO THE ADHESIVE NOT THE PATCH.       BP 150/110 mmHg  Pulse 74  Ht 6' 2.25" (1.886 m)  Wt 206 lb (93.441 kg)  BMI 26.27 kg/m2  SpO2 96%    Chief Complaint   Patient presents with   . Follow-Up      3 mo fu       HPI  History of:    History of chronic neck and back pain-Cervical radioablation history of chronic low back pain multiple level degenerative disc disease in the lumbar and also disc bulge without significant stenosis back in MRI in 2014 mild listhesis    Today follow-up above issues:    Patient reports that his neck pain is under control    For the most part he takes about one pain pill per day in his lumbar area    Today he feels a lot better because he changed his routine around  He started doing some yard work in the morning stretching  What is better is that he is learning to set his limits he stretches he ices    He reports that Dr. Aundria Rud changed his amlodipine to the evening    and he reports that he has not had any major dizzy episodes    However his back although stable he's starting to get some discomfort at the top of his feet that comes around the bottom of the arch to the top bilaterally without any sciatica basically now some numbness on both sides of the feet     he has not noted dramatic difference on thinking about Cymbalta today in that he did not take the pain away but he does admit it is helping somewhat        He has a follow-up pending with Dr. Miguel Dibble            Office Visit on 01/03/15   1. COMPREHENSIVE METABOLIC PANEL   Result Value Ref Range    Sodium 138 135 - 145 meq/L    Potassium 4.4 3.6 - 5.2 meq/L    Chloride 99 98 - 108 meq/L    Carbon Dioxide, Total 30 22 - 32 meq/L     Anion Gap 9 4 - 12    Glucose 106 62 - 125 mg/dL    Urea Nitrogen 19 8 - 21 mg/dL    Creatinine 1.61 (H) 0.51 - 1.18 mg/dL    Protein (Total) 7.4 6.0 - 8.2 g/dL    Albumin 4.7 3.5 - 5.2 g/dL    Bilirubin (Total) 1.2 0.2 - 1.3 mg/dL    Calcium 9.9 8.9 - 09.6 mg/dL    AST (GOT) 29 9 - 38 U/L    Alkaline Phosphatase (Total) 92 37 - 159 U/L    ALT (GPT) 26 10 - 48 U/L    GFR, Calc, European American >60 >59 mL/min    GFR, Calc, African American >60 >59 mL/min    GFR, Information       Calculated GFR in mL/min/1.73 m2 by MDRD equation.  Inaccurate with changing renal function.  See [email or web address]   2.  CBC, DIFF   Result Value Ref Range    WBC 4.59 4.3 - 10.0 10*3/uL    RBC 4.63 4.40 - 5.60 10*6/uL    Hemoglobin 16.3 13.0 - 18.0 g/dL    Hematocrit 48 38 - 50 %    MCV 105 (H) 81 - 98 fL    MCH 35.2 (H) 27.3 - 33.6 pg    MCHC 33.7 32.2 - 36.5 g/dL    Platelet Count 295 621 - 400 10*3/uL    RDW-CV 12.9 11.6 - 14.4 %    % Neutrophils 55 %    % Lymphocytes 29 %    % Monocytes 12 %    % Eosinophils 3 %    % Basophils 1 %    % Immature Granulocytes 0 %    Neutrophils 2.52 1.80 - 7.00 10*3/uL    Absolute Lymphocyte Count 1.34 1.00 - 4.80 10*3/uL    Monocytes 0.54 0.00 - 0.80 10*3/uL    Absolute Eosinophil Count 0.12 0.00 - 0.50 10*3/uL    Basophils 0.06 0.00 - 0.20 10*3/uL    Immature Granulocytes 0.01 0.00 - 0.05 10*3/uL    Nucleated RBC 0.00 0.00 10*3/uL    % Nucleated RBC 0 %   3. VITAMIN B12 (COBALAMIN)   Result Value Ref Range    Vitamin B12 (Cobalamin) 589 180 - 914 pg/mL   4. THYROID STIMULATING HORMONE   Result Value Ref Range    Thyroid Stimulating Hormone 1.540 0.400 - 5.000 u[IU]/mL   5. PROTEIN ELECTR. REFLX PNL   Result Value Ref Range    Protein (Total) 7.4 6.0 - 8.2 g/dL    Albumin 4.8 3.5 - 4.9 g/dL    Alpha 1 0.2 0.1 - 0.3 g/dL    Alpha 2 0.8 0.3 - 0.8 g/dL    Beta 0.7 0.6 - 1.0 g/dL    Gamma 0.9 0.4 - 1.4 g/dL    Electrophoresis Interp: Normal pattern. NORPAT   6.  IMMUNOFIXATION   Result Value Ref Range    Immunofixation No monoclonal component observed. [facility]         MEDICATION PRIOR TO VISIT  Reports   Outpatient Prescriptions Prior to Visit   Medication Sig Dispense Refill   . AmLODIPine Besylate 2.5 MG Oral Tab Take 1 tablet (2.5 mg) by mouth daily. 90 tablet 3   . Atorvastatin Calcium 10 MG Oral Tab Take 1 tablet (10 mg) by mouth daily. 90 tablet 1   . Cyanocobalamin 1000 MCG Oral Tab one per day over-the-counter started 5/21 /2012 this level borderline low 1 Tab 1   . Diclofenac Sodium (VOLTAREN) 1 % Transdermal Gel Apply 4 g topically 4 times a day. Apply to neck, trapezius muscle, and low back. No more than 4 g applied at a time. 1 Tube 5   . DULoxetine HCl 60 MG Oral CAPSULE ENTERIC COATED PARTICLES Take 1 capsule (60 mg) by mouth daily. 90 capsule 1   . EPINEPHrine 0.3 MG/0.3ML Injection Solution Auto-injector Inject as instructed per patient package insert, 0.3 mg intramuscularly or subcutaneously into the thigh, if needed to treat anaphylaxis 2 Autoinjector 0   . Hydrocodone-Acetaminophen 5-325 MG Oral Tab Take 1 tablet by mouth every 6 hours as needed. 60 tablet 0   . Ibuprofen 200 MG Oral Tab Take 1 tablet (200 mg) by mouth. Give with food.mid day as needed for neck and back pain     . Lidocaine 5 % External Patch Apply 1 patch onto the skin daily.  Apply to painful area for up to 12 hours in a 24 hour period. 30 patch 12   . Lisinopril 40 MG Oral Tab Take 1 tablet (40 mg) by mouth daily. Call pharmacy for refills 1-2 weeks in advance 90 tablet 3   . Nadolol 20 MG Oral Tab TAKE ONE TABLET BY MOUTH ONCE DAILY AS NEEDED FOR TREMOR 30 tablet 2   . Nadolol 40 MG Oral Tab Take 1 tablet (40 mg) by mouth daily. Call pharmacy 1-2 weeks prior for refills. Follow up appt due Oct 2015--Follow up with Neurologists 30 tablet 2     No facility-administered medications prior to visit.         REVIEW OF SYSTEMS - are otherwise negative unless as noted in HPI above or as  additional issues below    Constitutional:     Eyes:    Head ears, nose mouth throat:     Cardiovascular:   Respiratory:     Gastrointestinal:   Genitourinary:      Musculoskeletal:    Integumentary:     Neurological:   Psychiatric/Behavioral:       Endocrine:     Hematological:   Allergic/Immunologic:        PHYSICAL EXAM  BP 150/110 mmHg  Pulse 74  Ht 6' 2.25" (1.886 m)  Wt 206 lb (93.441 kg)  BMI 26.27 kg/m2  SpO2 96%    Well Dressed  He is pleasant under no acute distress     Constitution:  In general looks:   well , good coloring  No diaphoresis    Patient is Alert  & Attentive  Patient is conversive    Psychiatric Mental Status / Neurologic Mental Status:  Affect:  Pleasant   Calm  Mood:  Good  Awake: Alert and attentive  Not confused    Thought::     content : no gross issue   Process : logical  Language:   Speech is fluent  Intact comprehension    Eyes: no discharge, no gross puffiness, non- icteric  Ears: clear  Nose: clear  Mouth & Throat: no lesions or exudate    Neck:    Thyroid not enlarged  2+ carotid pulses no bruits    Cardiovascular:   Heart Regular rate , rhythm, no murmur    No edema    Lymph nodes:  No gross cervical swelling  No gross axillary swelling    Respiratory:   Not tachyphpnic  Clear to ascultation  No rales, rhonchi , without increased effort or stridor  No clubbing         Musculoskeletal:  No gross abnormal movement, twitch   No gross atrophy or deformity    Skin:  No gross lesion or rash    Neurologic:   Grossly intact  Memory:   Attention       Cranial Nerve    Eyes pupil and lid are intact   II, III, IV, VIII   Facial Movement VII: grossly intact, no facial droop     Motor- Gait-coordination: get up and go normal, no gross weakness           Foot exam:  Skin intact  Feet without any gross deformity warm and skin is intact without any redness  Nails     Hematologic/Lymphatic /Immunologic:  No gross bruising or rash or hives       IMPRESSION / PLAN / DISCUSSION     Alice  was seen today for follow-up .  Diagnoses and all orders for this visit:    Chronic bilateral low back pain without sciatica  -     Hydrocodone-Acetaminophen 5-325 MG Oral Tab; Take 1 tablet by mouth every 6 hours as needed.  -     Discontinue: Hydrocodone-Acetaminophen 5-325 MG Oral Tab; Take 1 tablet by mouth every 6 hours as needed.  -     Hydrocodone-Acetaminophen 5-325 MG Oral Tab; Take 1 tablet by mouth every 6 hours as needed.    Chronic pain syndrome  -     Hydrocodone-Acetaminophen 5-325 MG Oral Tab; Take 1 tablet by mouth every 6 hours as needed.  -     Hydrocodone-Acetaminophen 5-325 MG Oral Tab; Take 1 tablet by mouth every 6 hours as needed.  -  -     Hydrocodone-Acetaminophen 5-325 MG Oral Tab; Take 1 tablet by mouth every 6 hours as needed.    Cervicalgia  -     Hydrocodone-Acetaminophen 5-325 MG Oral Tab; Take 1 tablet by mouth every 6 hours as needed.  -     Hydrocodone-Acetaminophen 5-325 MG Oral Tab; Take 1 tablet by mouth every 6 hours as needed.      - Patient is on chronic pain medication dosage has been stable and minimal and he has no history of addiction-  He is continue to work with physical therapy the small amount of medication that he takes it so he can get out and do regular physical activities     and he does try to maintain his health    However he is now bothered with more sensation and discomfort paresthesias and tingling in his feet and he will be following this up with Dr. Miguel Dibble    Blood pressure appears to be adequate today and not as low      MEDICATION as of Discharge  Current Outpatient Prescriptions   Medication Sig Dispense Refill   . AmLODIPine Besylate 2.5 MG Oral Tab Take 1 tablet (2.5 mg) by mouth daily. 90 tablet 3   . Atorvastatin Calcium 10 MG Oral Tab Take 1 tablet (10 mg) by mouth daily. 90 tablet 1   . Cyanocobalamin 1000 MCG Oral Tab one per day over-the-counter started 5/21 /2012 this level borderline low 1 Tab 1   . Diclofenac Sodium (VOLTAREN) 1 %  Transdermal Gel Apply 4 g topically 4 times a day. Apply to neck, trapezius muscle, and low back. No more than 4 g applied at a time. 1 Tube 5   . DULoxetine HCl 60 MG Oral CAPSULE ENTERIC COATED PARTICLES Take 1 capsule (60 mg) by mouth daily. 90 capsule 1   . EPINEPHrine 0.3 MG/0.3ML Injection Solution Auto-injector Inject as instructed per patient package insert, 0.3 mg intramuscularly or subcutaneously into the thigh, if needed to treat anaphylaxis 2 Autoinjector 0   . Hydrocodone-Acetaminophen 5-325 MG Oral Tab Take 1 tablet by mouth every 6 hours as needed. 60 tablet 0   . [START ON 04/06/2015] Hydrocodone-Acetaminophen 5-325 MG Oral Tab Take 1 tablet by mouth every 6 hours as needed. 60 tablet 0   . [START ON 05/06/2015] Hydrocodone-Acetaminophen 5-325 MG Oral Tab Take 1 tablet by mouth every 6 hours as needed. 60 tablet 0   . Ibuprofen 200 MG Oral Tab Take 1 tablet (200 mg) by mouth. Give with food.mid day as needed for neck and back pain     . Lidocaine 5 % External Patch Apply 1 patch onto the skin daily. Apply to painful area for up to  12 hours in a 24 hour period. 30 patch 12   . Lisinopril 40 MG Oral Tab Take 1 tablet (40 mg) by mouth daily. Call pharmacy for refills 1-2 weeks in advance 90 tablet 3   . Nadolol 20 MG Oral Tab TAKE ONE TABLET BY MOUTH ONCE DAILY AS NEEDED FOR TREMOR 30 tablet 2   . Nadolol 40 MG Oral Tab Take 1 tablet (40 mg) by mouth daily. Call pharmacy 1-2 weeks prior for refills. Follow up appt due Oct 2015--Follow up with Neurologists 30 tablet 2     No current facility-administered medications for this visit.         Risk of taking medication properly and follow up discussed especially to call if problem    Patient   Family Member -   express understanding of care plan/medications    I spent a total time of 15 minutes face-to-face with the patient, of which more than 50% was spent counseling and coordinating care as outlined in this note.    No Follow-up on file.     Cc to following  MD:                ++++++++++++++++++++++++++++++++++++++++++++++++++        Patient Active Problem List    Diagnosis Date Noted   . 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 Tremor [R25.1] 04/20/2013   . Lumbosacral radiculopathy at S1 [M54.17] 03/22/2013     Left     . Fall [W19.XXXA] 02/23/2013     BP < 100  Occasioanal tri[p and leg weakness     . Essential tremor [G25.0] 02/15/2013   . Back pain, lumbosacral [M54.5, M54.89] 02/15/2013   . Lumbar radiculopathy, chronic [M54.16] 02/15/2013   . Neck pain [M54.2] 02/15/2013   . Chronic pain [G89.29] 12/21/2012     Now at [place], DO, Santiago Glad    Neck pain -burn c3-7 on left side per patient  Some trigger injection    Lumbar pain-since 80's better with exercise     . 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 Numbness and tingling of leg [R20.2] 12/21/2012     Left calf -left foot long term suspect from back-suspect L 45     . Chronic back pain [M54.9, G89.29] 12/21/2012     Mri in mid scape 1/214  Multilevel degenerative disc disease of the lumbar spine. Circumferential disc bulge at all levels below and including L1-2. No significant central spinal canal stenosis or neuroforaminal narrowing.  2. Mild retrolisthesis of L5 on S1.           . B12 deficiency [E53.8] 12/21/2012     Borderline low 5 /21/2014     . Chronic renal insufficiency, stage III (moderate) [N18.3] 12/21/2012     5/ 2013     . Hyperlipidemia [E78.5] 09/26/2012   . Cervicalgia [M54.2] 03/11/2010     Radiofrequency ablation of c3 to c 7     . Syncope [R55]    . Hypotension [I95.9]    . Essential hypertension [I10]    . 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 URI (upper respiratory infection) [J06.9]          Past Medical History   Diagnosis Date   . Syncope    . HYPOTENSION     . HYPERTENSION     . Carotid Sinus  Hypersensitivity    . URI (upper respiratory infection)    .  History of EKG 5/97   . Lipidemia    . Chronic kidney disease    . 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 myelopathy or radiculopathy 05/28/2014       Social History     Social History   . Marital Status: Married     Spouse Name: N/A   . Number of Children: N/A   . Years of Education: N/A     Occupational History   . Not on file.     Social History Main Topics   . Smoking status: Never Smoker    . Smokeless tobacco: Never Used   . Alcohol Use: Yes   . Drug Use: No   . Sexual Activity: Not on file     Other Topics Concern   . Not on file     Social History Narrative         Family History   Problem Relation Age of Onset   . Colon Cancer Father    . Heart (other) Mother      MI   . Other Family Hx       myelodysplasia       Social History     Social History   . Marital Status: Married     Spouse Name: N/A   . Number of Children: N/A   . Years of Education: N/A     Social History Main Topics   . Smoking status: Never Smoker    . Smokeless tobacco: Never Used   . Alcohol Use: Yes   . Drug Use: No   . Sexual Activity: Not on file     Other Topics Concern   . Not on file     Social History Narrative       Health Maintenance   Topic Date Due   . Colonoscopy  08/03/2013   . Influenza Vaccine (1) 04/04/2015   . Cholesterol Test  05/12/2019   . Tetanus Vaccine  12/22/2022   . Zoster Vaccine  Completed   . Hepatitis C Screen  Addressed   . HIV Screen  Addressed

## 2015-03-12 NOTE — Progress Notes (Signed)
Requested OV and any test performed at Dr. Jacinto Halim office on 03/07/15 appointment.

## 2015-03-13 ENCOUNTER — Ambulatory Visit (INDEPENDENT_AMBULATORY_CARE_PROVIDER_SITE_OTHER): Payer: PPO | Admitting: Neurology

## 2015-03-13 VITALS — BP 142/103 | HR 66 | Ht 74.25 in | Wt 206.0 lb

## 2015-03-13 DIAGNOSIS — G629 Polyneuropathy, unspecified: Secondary | ICD-10-CM

## 2015-03-13 DIAGNOSIS — R292 Abnormal reflex: Secondary | ICD-10-CM

## 2015-03-13 DIAGNOSIS — M792 Neuralgia and neuritis, unspecified: Secondary | ICD-10-CM

## 2015-03-13 DIAGNOSIS — R251 Tremor, unspecified: Secondary | ICD-10-CM

## 2015-03-13 DIAGNOSIS — I10 Essential (primary) hypertension: Secondary | ICD-10-CM

## 2015-03-13 MED ORDER — NADOLOL 40 MG OR TABS
40.0000 mg | ORAL_TABLET | Freq: Every day | ORAL | Status: DC
Start: 2015-03-13 — End: 2015-08-27

## 2015-03-13 NOTE — Progress Notes (Addendum)
[facility]  Physician: Milus Mallick, MD   Address: [address]   City/State/Zip: [place], [place] [address]   Phone: [phone]    DATE: 03/13/2015     HPI/CHIEF COMPLAINT:  Ronnie Mason is a 64 year old male who presents for EMG/NCS in investigation for bilateral foot paraesthesia.     He says that he has started having worsening bilateral foot paraesthesia since February. He has numbness from his knees down to his ankles. He has noticed increased gait stability 3 weeks ago after he quit drinking 6 weeks ago. He stopped because he wants to lose weight. He has not had a fall since February. No family history of leg paraesthesia.     He continues to have lower back pain. He says that the pain causes fatigue and weakness in his legs making it difficult to use the stairs. He has been going to physical therapy for the last 5 years. He continues to do exercises provided by his PT. He uses 5% Lidocaine patches for the pain.    He continues to have tremor and says that it is worse in the morning. The pain interferes with using utensils and effects his penmanship. He takes 40mg  Nadolol in the morning and 20mg  in the afternoon as needed for pain. He is interested in adjusting the prescription because the current dose is not effective. He spoke to Dr. Aundria Rud about his concerns and was redirected to speak to me.     He exercises by walking his dog about 0.5-1 mile twice per day.     No history of tropical travel. He does not use pesticides while gardening. He has not had HIV testing.     REVIEW OF SYSTEMS:  Neurological: See HPI  Musculoskeletal: See HPI    PHYSICAL EXAM:  Constitutional: This is a well-developed, pleasant male in no acute distress.  Vital Signs: BP 142/103 mmHg   Pulse 66   Ht 6' 2.25" (1.886 m)   Wt 206 lb (93.441 kg)   BMI 26.27 kg/m2  Mental Status: Alert and oriented, with intact attention, concentration, memory, language and fund of knowledge.  Cranial Nerves:  Oculomotor intact. No  facial weakness. No hearing loss. No dysarthria.  Motor:   There is no  incoordination in the limbs.  Broad based gait. Broader gait when walking on heels. Mild trunk ataxia and tremor. Normal finger to nose maneuver. Normal tone, bulk, and power. Brisk knee reflexes bilaterally. No ankle reflexes. Plantar responses are equivocal on left and extensor on right.   Sensory: Vibration sensation absent in the feet. Palpable sural nerve bilaterally. Mid calf over the inner aspect hypesthesia for pain/temperature.       EMG RESULTS:  Nerve Conduction Studies  Anti Sensory Summary Table   Site NR Onset (ms) Norm Onset (ms) P-T Amp (V) Norm P-T Amp Site1 Site2 Delta-0 (ms) Dist (cm) Vel (m/s) Norm Vel (m/s)   Left Sup Peron Anti Sensory (Ant Lat Mall)  29C   14 cm    4.0  2.3 >2 14 cm Ant Lat Mall 4.0 14.0 35 >32   Right Sup Peron Anti Sensory (Ant Lat Mall)  29C   14 cm    4.0  4.1 >2 14 cm Ant Lat Mall 4.0 14.0 35 >32   Left Sural Anti Sensory (Lat Mall)  29C    Averaged   Calf    4.6  4.1 >3.5 Calf Lat Mall 4.6 14.0 *30 >32  Right Sural Anti Sensory (Lat Mall)  29C   Calf    4.3  *3.4 >3.5 Calf Lat Mall 4.3 14.0 33 >32     Motor Summary Table   Site NR Onset (ms) Norm Onset (ms) O-P Amp (mV) Norm O-P Amp Site1 Site2 Delta-0 (ms) Dist (cm) Vel (m/s) Norm Vel (m/s)   Left Peroneal Motor (Ext Dig Brev)  29C   Ankle    5.8 <5.8 4.0 >1.5 B Fib Ankle 9.4 35.0 37 >34   B Fib    15.2  3.5  Poplt B Fib 2.3 10.0 43 >32   Poplt    17.5  3.4          Right Peroneal Motor (Ext Dig Brev)  29C   Ankle    5.5 <5.8 3.5 >1.5 B Fib Ankle 8.6 26.0 *30 >34   B Fib    14.1  2.7  Poplt B Fib 2.5 10.0 40 >32   Poplt    16.6  2.8          Left Tibial Motor (Abd Hall Brev)  29C   Ankle    4.5 <5.8 11.5 >2 Knee Ankle 13.8 47.0 34 >34   Knee    18.3  9.3          Right Tibial Motor (Abd Hall Brev)  29C   Ankle    5.2 <5.8 13.0 >2 Knee Ankle 12.8 48.0 38 >34   Knee    18.0  8.6            F Wave Studies   NR F-Lat (ms) Lat Norm (ms) L-R  F-Lat (ms) L-R Lat Norm   Left Peroneal (Mrkrs) (EDB)  29C      *62.71 <57 2.99 <5.1   Right Peroneal (Mrkrs) (EDB)  29C      *59.72 <57 2.99 <5.1   Left Tibial (Mrkrs) (Abd Hallucis)  29C      *69.35 <61.4 1.06 <5.7   Right Tibial (Mrkrs) (Abd Hallucis)  29C      *68.29 <61.4 1.06 <5.7     EMG   Side Muscle Nerve Root Ins Act Fibs Psw Amp Dur Poly Recrt Comment Fasciculations   Right Iliacus Femoral L2-3 Nml Nml Nml Nml Nml 0 Nml  0   Right VastusMed Femoral L2-4 Nml Nml Nml Nml Nml 0 Nml  0   Right BicepsFemS Sciatic L5-S1 Nml Nml Nml Nml Nml 0 Nml  0   Right AntTibialis Dp Br Peron L4-5 Nml Nml Nml Nml Nml 0 Nml  0   Right Gastroc Tibial S1-2 Nml Nml Nml Nml Nml 0 Nml  0   Left Iliacus Femoral L2-3 Nml Nml Nml Nml Nml 0 Nml  0   Left VastusMed Femoral L2-4 Nml Nml Nml Nml Nml 0 Nml  0   Left BicepsFemS Sciatic L5-S1 *Incr Nml *1+ *Incr *Incr 0 Nml  0   Left AntTibialis Dp Br Peron L4-5 Nml Nml Nml Nml Nml 0 Nml  0   Left Gastroc Tibial S1-2 *Incr Nml *1+ Nml Nml 0 Nml  0   Left L3 Parasp Rami L3 Nml Nml Nml         Left L4 Parasp Rami L4 *Incr Nml Nml         Left L5 Parasp Rami L5 Nml Nml Nml         Left S1 Parasp Rami S1 *Incr Nml *2+         Right L3 Parasp  Rami L3 Nml Nml Nml         Right L4 Parasp Rami L4 Nml Nml Nml         Right L5 Parasp Rami L5 Nml Nml Nml         Right S1 Parasp Rami S1 Nml Nml Nml           Nerve Conduction Studies  Motor Left/Right Comparison   Site L Lat (ms) R Lat (ms) L-R Lat (ms) L Amp (mV) R Amp (mV) L-R Amp (%) Site1 Site2 L Vel (m/s) R Vel (m/s) L-R Vel (m/s)   Peroneal Motor (Ext Dig Brev)  29C   Ankle 5.8 5.5 0.3 4.0 3.5 12.5 B Fib Ankle 37 *30 7   B Fib 15.2 14.1 1.1 3.5 2.7 22.9 Poplt B Fib 43 40 3   Poplt 17.5 16.6 0.9 3.4 2.8 17.6        Tibial Motor (Abd Hall Brev)  29C   Ankle 4.5 5.2 0.7 11.5 13.0 11.5 Knee Ankle 34 38 4   Knee 18.3 18.0 0.3 9.3 8.6 7.5          Anti Sensory Left/Right Comparison     Site L Lat (ms) R Lat (ms) L-R Lat (ms) L Amp (V) R Amp (V)  L-R Amp (%) Site1 Site2 L Vel (m/s) R Vel (m/s) L-R Vel (m/s)   Sup Peron Anti Sensory (Ant Lat Mall)  29C   14 cm 4.0 4.0 0.0 2.3 4.1 43.9 14 cm Ant Lat Mall 35 35 0   Sural Anti Sensory (Lat Mall)  29C    Averaged   Calf 4.6 4.3 0.3 4.1 *3.4 17.1 Calf Lat Mall *30 33 3       NCV FINDINGS:   Evaluation of the Right Peroneal Motor nerve demonstrated normal distal onset latency, normal amplitude, decreased conduction velocity (B Fib-Ankle), and normal conduction velocity (Poplt-B Fib).   The Left Sural Anti Sensory nerve demonstrated normal amplitude and decreased conduction velocity (Calf-Lat Mall).   The Right Sural Anti Sensory nerve demonstrated reduced amplitude and normal conduction velocity (Calf-Lat [place]).   The Left Peroneal Motor nerve demonstrated normal distal onset latency, normal amplitude, normal conduction velocity (B Fib-Ankle), and normal conduction velocity (Poplt-B Fib).   The Left Tibial Motor and the Right Tibial Motor nerves demonstrated normal distal onset latency, normal amplitude, and normal conduction velocity (Knee-Ankle).   The Left Sup Peron Anti Sensory and the Right Sup Peron Anti Sensory nerves demonstrated normal amplitude and normal conduction velocity (14 cm-Ant Lat Mall).   F Wave studies indicate that the Left Peroneal F Wave has prolonged latency (62.71 ms).   The Right Peroneal F Wave has prolonged latency (59.72 ms).   The Left Tibial F Wave has prolonged latency (69.35 ms).   The Right Tibial F Wave has prolonged latency (68.29 ms).      EMG FINDINGS:   Monopolar needle EMG of the Left BicepsFemS muscle demonstrated increased insertional activity, slightly increased spontaneous activity, increased motor unit amplitude, and increased  motor unit duration.   The Left Gastrocnemius muscle demonstrated increased insertional activity and slightly increased spontaneous activity.   The Left L4 Parasp muscle demonstrated increased insertional activity.   The Left S1  Parasp muscle demonstrated increased insertional activity and moderately increased spontaneous activity.   All remaining muscles (as indicated in the preceding table) showed no evidence of abnormal spontaneous activity, motor unit remodeling or myopathic changes.    IMPRESSION:  This is an abnormal nerve conduction study demonstrating peroneal motor nerve slowing in the segment below the fibular head to the ankle.  In addition there is significant prolongation of f-response latencies in all motor nerves tested with evidence of progression since the prior study of just over one year ago (12/27/13). SAP amplitudes and conduction velocities are normal.  EMG demonstrates abnormal spontaneous activity again in the S1 myotome on the left, consistent with findings reported in 2015.  Findings are consistent with a proximal/multifocal  motor nerve demyelinating disorder with evidence for subtle segmental motor axonopathy at S1 on the left only.  Clinical correlation is required.          ___________________________  Christean Grief, M.D., P.S.  Diplomate, Biomedical engineer of Psychiatry and Neurology.                                               ASSESSMENT/PLAN:     ICD-10-CM ICD-9-CM    1. 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 Tremor R25.1 781.0 Nadolol 40 MG Oral Tab   3. Babinski sign R29.2 796.1 MRI BRAIN WO CONTRAST   4. 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  This entity  may be amenable to treatment.  Additional investigations are explained and as noted above.    He is essential tremor is bothersome-an increase in his nadolol dose is recommended.    While his gait disorder and imbalance are readily explicable on the basis of large fiber, sensory neuropathy (i.e. that due to inflammatory demyelination), the presence of long track signs(a Babinski sign) suggests the possibility of an additional CNS process.  I therefore recommended imaging of the brain as well.    Patient Instructions   It appears that you have a progressive neuropathy that may be inflammatory in nature.  It is affecting sensation principally and causing imbalance.    We will consider whether you should have a spinal tap after your tests have been completed.    Increase 40mg  Nadolol dose to 60mg  (1.5 tablet) in the morning for 1 week. If tolerated, increase the dose to 80mg  (2 tablets) in the morning.    Visit radiology for MRI brain (without contrast) and MRI lumbar spine (with and without contrast).     Visit the lab for blood work.     Follow up with me after completing blood work and imaging.     Prior EMR records reviewed as available and clinically relevant. All questions answered. Discharge and follow up instructions were discussed with the patient and/or accompanying persons.    LAB RESULTS:  Office Visit on 01/03/15   1. COMPREHENSIVE METABOLIC PANEL   Result Value Ref Range    Sodium 138 135 - 145 meq/L  Potassium 4.4 3.6 - 5.2 meq/L    Chloride 99 98 - 108 meq/L    Carbon Dioxide, Total 30 22 - 32 meq/L    Anion Gap 9 4 - 12    Glucose 106 62 - 125 mg/dL    Urea Nitrogen 19 8 - 21 mg/dL    Creatinine 1.61 (H) 0.51 - 1.18 mg/dL    Protein (Total) 7.4 6.0 - 8.2 g/dL    Albumin 4.7 3.5 - 5.2 g/dL    Bilirubin (Total) 1.2 0.2 - 1.3 mg/dL    Calcium 9.9 8.9 - 09.6 mg/dL    AST (GOT) 29 9 - 38 U/L    Alkaline Phosphatase (Total) 92 37 - 159 U/L    ALT (GPT) 26 10 - 48 U/L    GFR, Calc, European American >60 >59  mL/min    GFR, Calc, African American >60 >59 mL/min    GFR, Information       Calculated GFR in mL/min/1.73 m2 by MDRD equation.  Inaccurate with changing renal function.  See [email or web address]://depts.[email or web address].it.html   2. CBC, DIFF   Result Value Ref Range    WBC 4.59 4.3 - 10.0 10*3/uL    RBC 4.63 4.40 - 5.60 10*6/uL    Hemoglobin 16.3 13.0 - 18.0 g/dL    Hematocrit 48 38 - 50 %    MCV 105 (H) 81 - 98 fL    MCH 35.2 (H) 27.3 - 33.6 pg    MCHC 33.7 32.2 - 36.5 g/dL    Platelet Count 045 409 - 400 10*3/uL    RDW-CV 12.9 11.6 - 14.4 %    % Neutrophils 55 %    % Lymphocytes 29 %    % Monocytes 12 %    % Eosinophils 3 %    % Basophils 1 %    % Immature Granulocytes 0 %    Neutrophils 2.52 1.80 - 7.00 10*3/uL    Absolute Lymphocyte Count 1.34 1.00 - 4.80 10*3/uL    Monocytes 0.54 0.00 - 0.80 10*3/uL    Absolute Eosinophil Count 0.12 0.00 - 0.50 10*3/uL    Basophils 0.06 0.00 - 0.20 10*3/uL    Immature Granulocytes 0.01 0.00 - 0.05 10*3/uL    Nucleated RBC 0.00 0.00 10*3/uL    % Nucleated RBC 0 %   3. VITAMIN B12 (COBALAMIN)   Result Value Ref Range    Vitamin B12 (Cobalamin) 589 180 - 914 pg/mL   4. THYROID STIMULATING HORMONE   Result Value Ref Range    Thyroid Stimulating Hormone 1.540 0.400 - 5.000 u[IU]/mL   5. PROTEIN ELECTR. REFLX PNL   Result Value Ref Range    Protein (Total) 7.4 6.0 - 8.2 g/dL    Albumin 4.8 3.5 - 4.9 g/dL    Alpha 1 0.2 0.1 - 0.3 g/dL    Alpha 2 0.8 0.3 - 0.8 g/dL    Beta 0.7 0.6 - 1.0 g/dL    Gamma 0.9 0.4 - 1.4 g/dL    Electrophoresis Interp: Normal pattern. NORPAT   6. IMMUNOFIXATION   Result Value Ref Range    Immunofixation No monoclonal component observed. [facility]       PROBLEM LIST:  Patient Active Problem List   Diagnosis    Syncope    Hypotension    Essential hypertension    Carotid Sinus Hypersensitivity    URI (upper respiratory infection)    Cervicalgia    Hyperlipidemia    Chronic pain    Bee sting allergy  Numbness and tingling of leg    Chronic back  pain    B12 deficiency    Chronic renal insufficiency, stage III (moderate)    Essential tremor    Back pain, lumbosacral    Lumbar radiculopathy, chronic    Neck pain    Fall    Lumbosacral radiculopathy at S1    Tremor    Sensory neuropathy (HCC)    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 myelopathy or radiculopathy       PAST MEDICAL HISTORY:  Past Medical History   Diagnosis Date    Syncope     HYPOTENSION      HYPERTENSION      Carotid Sinus Hypersensitivity     URI (upper respiratory infection)     History of EKG 5/97    Lipidemia     Chronic kidney disease     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 myelopathy or radiculopathy 05/28/2014       SURGICAL HISTORY:  Past Surgical History   Procedure Laterality Date    Unlisted procedure hands/fingers      Unlisted procedure femur/knee      Anes; colonoscopy  2002     repeat in 3 years    Anes; colonoscopy & polypectomy  11/18/2007     repeat in 3 years    Unlisted procedure spine  2013     rfa to c3 to c 7        MEDICATIONS:  Outpatient Prescriptions Prior to Visit   Medication Sig Dispense Refill    AmLODIPine Besylate 2.5 MG Oral Tab Take 1 tablet (2.5 mg) by mouth daily. 90 tablet 3    Atorvastatin Calcium 10 MG Oral Tab Take 1 tablet (10 mg) by mouth daily. 90 tablet 1    Cyanocobalamin 1000 MCG Oral Tab one per day over-the-counter started 5/21 /2012 this level borderline low 1 Tab 1    Diclofenac Sodium (VOLTAREN) 1 % Transdermal Gel Apply 4 g topically 4 times a day. Apply to neck, trapezius muscle, and low back. No more than 4 g applied at a time. 1 Tube 5    DULoxetine HCl 60 MG Oral CAPSULE ENTERIC COATED PARTICLES Take 1 capsule (60 mg) by mouth daily. 90 capsule 1    EPINEPHrine 0.3 MG/0.3ML Injection Solution Auto-injector Inject as instructed per patient package insert, 0.3 mg intramuscularly or subcutaneously into the thigh, if needed to treat anaphylaxis 2 Autoinjector 0    Hydrocodone-Acetaminophen 5-325 MG Oral Tab  Take 1 tablet by mouth every 6 hours as needed. 60 tablet 0    [START ON 04/06/2015] Hydrocodone-Acetaminophen 5-325 MG Oral Tab Take 1 tablet by mouth every 6 hours as needed. 60 tablet 0    [START ON 05/06/2015] Hydrocodone-Acetaminophen 5-325 MG Oral Tab Take 1 tablet by mouth every 6 hours as needed. 60 tablet 0    Ibuprofen 200 MG Oral Tab Take 1 tablet (200 mg) by mouth. Give with food.mid day as needed for neck and back pain      Lidocaine 5 % External Patch Apply 1 patch onto the skin daily. Apply to painful area for up to 12 hours in a 24 hour period. 30 patch 12    Lisinopril 40 MG Oral Tab Take 1 tablet (40 mg) by mouth daily. Call pharmacy for refills 1-2 weeks in advance 90 tablet 3    Nadolol 20 MG Oral Tab TAKE ONE TABLET BY MOUTH ONCE DAILY AS NEEDED FOR TREMOR 30 tablet 2  Nadolol 40 MG Oral Tab Take 1 tablet (40 mg) by mouth daily. Call pharmacy 1-2 weeks prior for refills. Follow up appt due Oct 2015--Follow up with Neurologists 30 tablet 2     No facility-administered medications prior to visit.       ALLERGIES:  Review of patient's allergies indicates:  Allergies   Allergen Reactions    Bee Venom Hives, Itching and Swelling    Gabapentin      Flu like symptoms, diarrhea, body ache upset stomach    Lidocaine Rash     Lidocaine patch. PATIENT STATES HE IS ALLERGIC TO THE ADHESIVE NOT THE PATCH.       FAMILY HISTORY:  Family History   Problem Relation Age of Onset    Colon Cancer Father     Heart (other) Mother      MI    Other Family Hx       myelodysplasia       SOCIAL HISTORY:  Social History     Social History    Marital Status: Married     Spouse Name: N/A    Number of Children: N/A    Years of Education: N/A     Occupational History    Not on file.     Social History Main Topics    Smoking status: Never Smoker     Smokeless tobacco: Never Used    Alcohol Use: Yes    Drug Use: No    Sexual Activity: Not on file     Other Topics Concern    Not on file     Social History  Narrative         Total time of greater than 45 minutes was spent face-to-face with the patient, of which more than 50% was spent counseling and coordinating care  as outlined in this note in addition to EMG and nerve conduction testing.      Portions of today's documentation have been created with the assistance of voice recognition software. Therefore, it may contain anomalous punctuation, inadvertent misrecognitions, word substitutions, insertions or omissions. Occasional wrong-word or sound-alike substitutions may also occur, all due to the inherent limitations of voice recognition software. Attempts to correct the above have been made by Dr. Miguel Dibble, but it is recommended that the chart be read carefully to recognize, using context, where these substitutions may have occurred. Please notify us if a serious error or discrepancy is present so that it may be corrected or if there is confusion regarding this document    Portions of this chart were written by medical scribe, Johnnye Lana, with oversight by Dr. Liz Beach. Kirschner on 03/13/2015 10:41 AM

## 2015-03-13 NOTE — Progress Notes (Signed)
The patient presents today for Nueropathy  Last visit: 01/03/2015  Vital complete:  YES  Care Everywhere records available/ reconciled:  YES  PHQ2 complete:  YES  Pharmacy complete: YES   Refills pended:  NO  Medications reviewed:  YES    Medical, surgical, family, and social updated:  YES  Health Maintenance updated:  YES  Recent imaging or hospital visit?:  NO (if yes=pull up images/documents)  Physician added to patient's care team:  YES

## 2015-03-13 NOTE — Patient Instructions (Addendum)
It appears that you have a progressive neuropathy that may be inflammatory in nature.  It is affecting sensation principally and causing imbalance.    We will consider whether you should have a spinal tap after your tests have been completed.    Increase  Nadolol dose to  (1.5 tablet) in the morning for 1 week. If tolerated, increase the dose to  (2 tablets) in the morning.    Visit radiology for MRI brain (without contrast) and MRI lumbar spine (with and without contrast).     Visit the lab for blood work.     Follow up with me after completing blood work and imaging.

## 2015-03-14 MED ORDER — CEFAZOLIN SODIUM-DEXTROSE 2-3 GM-% IV SOLR
2.0000 g | INTRAVENOUS | Status: AC
  Administered 2015-03-15: 2 g via INTRAVENOUS
  Filled 2015-03-14: qty 50

## 2015-03-14 MED ORDER — SODIUM CHLORIDE 0.9 % IV SOLN: INTRAVENOUS | Status: DC

## 2015-03-14 MED ORDER — TRANEXAMIC ACID 1000 MG/10ML IV SOLN
1000.0000 mg | INTRAVENOUS | Status: AC
  Administered 2015-03-15: 1000 mg via INTRAVENOUS
  Filled 2015-03-14: qty 10

## 2015-03-15 ENCOUNTER — Encounter (HOSPITAL_COMMUNITY)
Admission: RE | Disposition: A | Discharge status: Skilled Nursing Facility | Payer: Self-pay | Source: Ambulatory Visit | Attending: Orthopedic Surgery

## 2015-03-15 ENCOUNTER — Encounter (HOSPITAL_COMMUNITY): Payer: Self-pay | Admitting: *Deleted

## 2015-03-15 ENCOUNTER — Inpatient Hospital Stay (HOSPITAL_COMMUNITY): Payer: 59 | Admitting: Anesthesiology

## 2015-03-15 ENCOUNTER — Inpatient Hospital Stay (HOSPITAL_COMMUNITY): Payer: 59 | Admitting: Vascular Surgery

## 2015-03-15 ENCOUNTER — Inpatient Hospital Stay (HOSPITAL_COMMUNITY)
Admission: RE | Admit: 2015-03-15 | Discharge: 2015-03-18 | DRG: 470 | Disposition: A | Discharge status: Skilled Nursing Facility | Payer: 59 | Source: Ambulatory Visit | Attending: Orthopedic Surgery | Admitting: Orthopedic Surgery

## 2015-03-15 ENCOUNTER — Inpatient Hospital Stay (HOSPITAL_COMMUNITY): Payer: 59

## 2015-03-15 DIAGNOSIS — G609 Hereditary and idiopathic neuropathy, unspecified: Secondary | ICD-10-CM

## 2015-03-15 DIAGNOSIS — F419 Anxiety disorder, unspecified: Secondary | ICD-10-CM | POA: Diagnosis present

## 2015-03-15 DIAGNOSIS — M25551 Pain in right hip: Secondary | ICD-10-CM | POA: Diagnosis present

## 2015-03-15 DIAGNOSIS — Z96649 Presence of unspecified artificial hip joint: Secondary | ICD-10-CM

## 2015-03-15 DIAGNOSIS — K219 Gastro-esophageal reflux disease without esophagitis: Secondary | ICD-10-CM | POA: Diagnosis present

## 2015-03-15 DIAGNOSIS — M5136 Other intervertebral disc degeneration, lumbar region: Secondary | ICD-10-CM | POA: Diagnosis present

## 2015-03-15 DIAGNOSIS — D62 Acute posthemorrhagic anemia: Secondary | ICD-10-CM | POA: Diagnosis not present

## 2015-03-15 DIAGNOSIS — F17210 Nicotine dependence, cigarettes, uncomplicated: Secondary | ICD-10-CM | POA: Diagnosis present

## 2015-03-15 DIAGNOSIS — F329 Major depressive disorder, single episode, unspecified: Secondary | ICD-10-CM | POA: Diagnosis present

## 2015-03-15 DIAGNOSIS — M1611 Unilateral primary osteoarthritis, right hip: Secondary | ICD-10-CM | POA: Diagnosis present

## 2015-03-15 DIAGNOSIS — Z8249 Family history of ischemic heart disease and other diseases of the circulatory system: Secondary | ICD-10-CM | POA: Diagnosis not present

## 2015-03-15 HISTORY — DX: Hereditary and idiopathic neuropathy, unspecified: G60.9

## 2015-03-15 HISTORY — PX: TOTAL HIP ARTHROPLASTY: SHX124

## 2015-03-15 LAB — CBC
HCT: 40.2 % (ref 39.0–52.0)
Hemoglobin: 13.1 g/dL (ref 13.0–17.0)
MCH: 29.2 pg (ref 26.0–34.0)
MCHC: 32.6 g/dL (ref 30.0–36.0)
MCV: 89.5 fL (ref 78.0–100.0)
Platelets: 258 10*3/uL (ref 150–400)
RBC: 4.49 MIL/uL (ref 4.22–5.81)
RDW: 13.1 % (ref 11.5–15.5)
WBC: 13.8 10*3/uL — ABNORMAL HIGH (ref 4.0–10.5)

## 2015-03-15 LAB — CREATININE, SERUM: CREATININE: 1.12 mg/dL (ref 0.61–1.24)

## 2015-03-15 SURGERY — ARTHROPLASTY, HIP, TOTAL,POSTERIOR APPROACH
Anesthesia: General | Site: Hip | Laterality: Right

## 2015-03-15 MED ORDER — METHOCARBAMOL 500 MG PO TABS
500.0000 mg | ORAL_TABLET | Freq: Four times a day (QID) | ORAL | Status: DC | PRN
  Administered 2015-03-16 – 2015-03-18 (×3): 500 mg via ORAL
  Filled 2015-03-15 (×3): qty 1

## 2015-03-15 MED ORDER — PROPOFOL 10 MG/ML IV BOLUS
INTRAVENOUS | Status: AC
  Filled 2015-03-15: qty 20

## 2015-03-15 MED ORDER — PROMETHAZINE HCL 25 MG/ML IJ SOLN
6.2500 mg | INTRAMUSCULAR | Status: DC | PRN
Start: 2015-03-15 — End: 2015-03-15

## 2015-03-15 MED ORDER — ENOXAPARIN SODIUM 30 MG/0.3ML ~~LOC~~ SOLN: 30.0000 mg | Freq: Two times a day (BID) | SUBCUTANEOUS | Status: DC

## 2015-03-15 MED ORDER — NEOSTIGMINE METHYLSULFATE 10 MG/10ML IV SOLN
INTRAVENOUS | Status: AC
  Filled 2015-03-15: qty 1

## 2015-03-15 MED ORDER — NEOSTIGMINE METHYLSULFATE 10 MG/10ML IV SOLN
INTRAVENOUS | Status: DC | PRN
  Administered 2015-03-15: 4 mg via INTRAVENOUS

## 2015-03-15 MED ORDER — ONDANSETRON HCL 4 MG PO TABS: 4.0000 mg | ORAL_TABLET | Freq: Four times a day (QID) | ORAL | Status: DC | PRN

## 2015-03-15 MED ORDER — FENTANYL CITRATE (PF) 250 MCG/5ML IJ SOLN
INTRAMUSCULAR | Status: AC
  Filled 2015-03-15: qty 5

## 2015-03-15 MED ORDER — FENTANYL CITRATE (PF) 100 MCG/2ML IJ SOLN
INTRAMUSCULAR | Status: DC | PRN
  Administered 2015-03-15: 50 ug via INTRAVENOUS
  Administered 2015-03-15: 100 ug via INTRAVENOUS
  Administered 2015-03-15 (×2): 50 ug via INTRAVENOUS

## 2015-03-15 MED ORDER — PHENYLEPHRINE 40 MCG/ML (10ML) SYRINGE FOR IV PUSH (FOR BLOOD PRESSURE SUPPORT)
PREFILLED_SYRINGE | INTRAVENOUS | Status: AC
  Filled 2015-03-15: qty 10

## 2015-03-15 MED ORDER — OXYCODONE HCL 5 MG PO TABS
5.0000 mg | ORAL_TABLET | ORAL | Status: DC | PRN
  Administered 2015-03-16: 5 mg via ORAL
  Administered 2015-03-16 – 2015-03-17 (×2): 10 mg via ORAL
  Administered 2015-03-17: 5 mg via ORAL
  Administered 2015-03-17 – 2015-03-18 (×4): 10 mg via ORAL
  Filled 2015-03-15 (×4): qty 2
  Filled 2015-03-15 (×2): qty 1
  Filled 2015-03-15 (×3): qty 2

## 2015-03-15 MED ORDER — MENTHOL 3 MG MT LOZG
1.0000 | LOZENGE | OROMUCOSAL | Status: DC | PRN
Start: 2015-03-15 — End: 2015-03-18

## 2015-03-15 MED ORDER — VENLAFAXINE HCL ER 37.5 MG PO CP24
37.5000 mg | ORAL_CAPSULE | Freq: Every day | ORAL | Status: DC
  Administered 2015-03-16 – 2015-03-18 (×3): 37.5 mg via ORAL
  Filled 2015-03-15 (×4): qty 1

## 2015-03-15 MED ORDER — ENOXAPARIN SODIUM 30 MG/0.3ML ~~LOC~~ SOLN
30.0000 mg | Freq: Two times a day (BID) | SUBCUTANEOUS | Status: DC
  Administered 2015-03-16 – 2015-03-18 (×5): 30 mg via SUBCUTANEOUS
  Filled 2015-03-15 (×5): qty 0.3

## 2015-03-15 MED ORDER — ACETAMINOPHEN 650 MG RE SUPP: 650.0000 mg | Freq: Four times a day (QID) | RECTAL | Status: DC | PRN

## 2015-03-15 MED ORDER — ROCURONIUM BROMIDE 50 MG/5ML IV SOLN
INTRAVENOUS | Status: AC
  Filled 2015-03-15: qty 1

## 2015-03-15 MED ORDER — ROCURONIUM BROMIDE 100 MG/10ML IV SOLN
INTRAVENOUS | Status: DC | PRN
  Administered 2015-03-15: 50 mg via INTRAVENOUS

## 2015-03-15 MED ORDER — HYDROMORPHONE HCL 1 MG/ML IJ SOLN
INTRAMUSCULAR | Status: AC
  Filled 2015-03-15: qty 1

## 2015-03-15 MED ORDER — MIDAZOLAM HCL 5 MG/5ML IJ SOLN
INTRAMUSCULAR | Status: DC | PRN
  Administered 2015-03-15: 2 mg via INTRAVENOUS

## 2015-03-15 MED ORDER — OXYCODONE HCL 5 MG PO TABS: 5.0000 mg | ORAL_TABLET | Freq: Once | ORAL | Status: DC | PRN

## 2015-03-15 MED ORDER — MIDAZOLAM HCL 2 MG/2ML IJ SOLN
INTRAMUSCULAR | Status: AC
  Filled 2015-03-15: qty 4

## 2015-03-15 MED ORDER — LIDOCAINE HCL (CARDIAC) 20 MG/ML IV SOLN
INTRAVENOUS | Status: AC
  Filled 2015-03-15: qty 5

## 2015-03-15 MED ORDER — HYDROMORPHONE HCL 1 MG/ML IJ SOLN
0.2500 mg | INTRAMUSCULAR | Status: DC | PRN
  Administered 2015-03-15 (×4): 0.5 mg via INTRAVENOUS

## 2015-03-15 MED ORDER — LIDOCAINE HCL (CARDIAC) 20 MG/ML IV SOLN
INTRAVENOUS | Status: DC | PRN
  Administered 2015-03-15: 80 mg via INTRAVENOUS

## 2015-03-15 MED ORDER — METHOCARBAMOL 1000 MG/10ML IJ SOLN
500.0000 mg | INTRAMUSCULAR | Status: AC
  Administered 2015-03-15: 500 mg via INTRAVENOUS
  Filled 2015-03-15: qty 5

## 2015-03-15 MED ORDER — BISACODYL 10 MG RE SUPP: 10.0000 mg | Freq: Every day | RECTAL | Status: DC | PRN

## 2015-03-15 MED ORDER — DOCUSATE SODIUM 100 MG PO CAPS
100.0000 mg | ORAL_CAPSULE | Freq: Two times a day (BID) | ORAL | Status: DC
  Administered 2015-03-15 – 2015-03-18 (×5): 100 mg via ORAL
  Filled 2015-03-15 (×6): qty 1

## 2015-03-15 MED ORDER — OXYCODONE HCL 5 MG/5ML PO SOLN: 5.0000 mg | Freq: Once | ORAL | Status: DC | PRN

## 2015-03-15 MED ORDER — PHENOL 1.4 % MT LIQD: 1.0000 | OROMUCOSAL | Status: DC | PRN

## 2015-03-15 MED ORDER — PROPOFOL 10 MG/ML IV BOLUS
INTRAVENOUS | Status: DC | PRN
  Administered 2015-03-15: 200 mg via INTRAVENOUS
  Administered 2015-03-15: 50 mg via INTRAVENOUS

## 2015-03-15 MED ORDER — POLYETHYLENE GLYCOL 3350 17 G PO PACK: 17.0000 g | PACK | Freq: Every day | ORAL | Status: DC | PRN

## 2015-03-15 MED ORDER — SODIUM CHLORIDE 0.9 % IV SOLN
INTRAVENOUS | Status: DC
  Administered 2015-03-15: 18:00:00 via INTRAVENOUS

## 2015-03-15 MED ORDER — ACETAMINOPHEN 325 MG PO TABS: 650.0000 mg | ORAL_TABLET | Freq: Four times a day (QID) | ORAL | Status: DC | PRN

## 2015-03-15 MED ORDER — GLYCOPYRROLATE 0.2 MG/ML IJ SOLN
INTRAMUSCULAR | Status: DC | PRN
  Administered 2015-03-15: .6 mg via INTRAVENOUS

## 2015-03-15 MED ORDER — CEFAZOLIN SODIUM-DEXTROSE 2-3 GM-% IV SOLR
2.0000 g | Freq: Four times a day (QID) | INTRAVENOUS | Status: AC
Start: 2015-03-15 — End: 2015-03-15
  Administered 2015-03-15 (×2): 2 g via INTRAVENOUS
  Filled 2015-03-15 (×2): qty 50

## 2015-03-15 MED ORDER — PHENYLEPHRINE HCL 10 MG/ML IJ SOLN
INTRAMUSCULAR | Status: DC | PRN
  Administered 2015-03-15 (×2): 80 ug via INTRAVENOUS
  Administered 2015-03-15: 40 ug via INTRAVENOUS

## 2015-03-15 MED ORDER — DIPHENHYDRAMINE HCL 12.5 MG/5ML PO ELIX: 12.5000 mg | ORAL_SOLUTION | ORAL | Status: DC | PRN

## 2015-03-15 MED ORDER — HYDROMORPHONE HCL 1 MG/ML IJ SOLN
1.0000 mg | INTRAMUSCULAR | Status: DC | PRN
  Administered 2015-03-16 (×2): 1 mg via INTRAVENOUS
  Filled 2015-03-15 (×2): qty 1

## 2015-03-15 MED ORDER — ALUM & MAG HYDROXIDE-SIMETH 200-200-20 MG/5ML PO SUSP: 30.0000 mL | ORAL | Status: DC | PRN

## 2015-03-15 MED ORDER — GLYCOPYRROLATE 0.2 MG/ML IJ SOLN
INTRAMUSCULAR | Status: AC
  Filled 2015-03-15: qty 2

## 2015-03-15 MED ORDER — ONDANSETRON HCL 4 MG/2ML IJ SOLN: 4.0000 mg | Freq: Four times a day (QID) | INTRAMUSCULAR | Status: DC | PRN

## 2015-03-15 MED ORDER — FLEET ENEMA 7-19 GM/118ML RE ENEM: 1.0000 | ENEMA | Freq: Once | RECTAL | Status: DC | PRN

## 2015-03-15 MED ORDER — OXYCODONE-ACETAMINOPHEN 5-325 MG PO TABS: 1.0000 | ORAL_TABLET | ORAL | Status: DC | PRN

## 2015-03-15 MED ORDER — 0.9 % SODIUM CHLORIDE (POUR BTL) OPTIME
TOPICAL | Status: DC | PRN
  Administered 2015-03-15: 1000 mL

## 2015-03-15 MED ORDER — ONDANSETRON HCL 4 MG/2ML IJ SOLN
INTRAMUSCULAR | Status: AC
  Filled 2015-03-15: qty 2

## 2015-03-15 MED ORDER — ONDANSETRON HCL 4 MG/2ML IJ SOLN
INTRAMUSCULAR | Status: DC | PRN
  Administered 2015-03-15: 4 mg via INTRAVENOUS

## 2015-03-15 MED ORDER — LACTATED RINGERS IV SOLN
INTRAVENOUS | Status: DC
  Administered 2015-03-15 (×3): via INTRAVENOUS

## 2015-03-15 MED ORDER — METOCLOPRAMIDE HCL 5 MG PO TABS: 5.0000 mg | ORAL_TABLET | Freq: Three times a day (TID) | ORAL | Status: DC | PRN

## 2015-03-15 MED ORDER — METOCLOPRAMIDE HCL 5 MG/ML IJ SOLN: 5.0000 mg | Freq: Three times a day (TID) | INTRAMUSCULAR | Status: DC | PRN

## 2015-03-15 MED ORDER — METHOCARBAMOL 1000 MG/10ML IJ SOLN
500.0000 mg | Freq: Four times a day (QID) | INTRAVENOUS | Status: DC | PRN
  Filled 2015-03-15: qty 5

## 2015-03-15 SURGICAL SUPPLY — 63 items
BLADE SAW SAG 73X25 THK (BLADE) ×2
BLADE SAW SGTL 73X25 THK (BLADE) ×1 IMPLANT
BRUSH FEMORAL CANAL (MISCELLANEOUS) IMPLANT
CAPT HIP TOTAL 2 ×2 IMPLANT
COVER SURGICAL LIGHT HANDLE (MISCELLANEOUS) ×5 IMPLANT
DRAPE IMP U-DRAPE 54X76 (DRAPES) ×3 IMPLANT
DRAPE INCISE IOBAN 66X45 STRL (DRAPES) IMPLANT
DRAPE ORTHO SPLIT 77X108 STRL (DRAPES) ×6
DRAPE SURG ORHT 6 SPLT 77X108 (DRAPES) ×2 IMPLANT
DRAPE U-SHAPE 47X51 STRL (DRAPES) ×3 IMPLANT
DRSG ADAPTIC 3X8 NADH LF (GAUZE/BANDAGES/DRESSINGS) ×3 IMPLANT
DRSG PAD ABDOMINAL 8X10 ST (GAUZE/BANDAGES/DRESSINGS) ×6 IMPLANT
DURAPREP 26ML APPLICATOR (WOUND CARE) ×3 IMPLANT
ELECT BLADE 4.0 EZ CLEAN MEGAD (MISCELLANEOUS) ×3
ELECT BLADE 6.5 EXT (BLADE) IMPLANT
ELECT CAUTERY BLADE 6.4 (BLADE) ×3 IMPLANT
ELECT REM PT RETURN 9FT ADLT (ELECTROSURGICAL) ×3
ELECTRODE BLDE 4.0 EZ CLN MEGD (MISCELLANEOUS) IMPLANT
ELECTRODE REM PT RTRN 9FT ADLT (ELECTROSURGICAL) ×1 IMPLANT
FACESHIELD WRAPAROUND (MASK) IMPLANT
FACESHIELD WRAPAROUND OR TEAM (MASK) ×2 IMPLANT
GAUZE SPONGE 4X4 12PLY STRL (GAUZE/BANDAGES/DRESSINGS) ×3 IMPLANT
GLOVE BIOGEL PI IND STRL 7.5 (GLOVE) IMPLANT
GLOVE BIOGEL PI IND STRL 8 (GLOVE) ×2 IMPLANT
GLOVE BIOGEL PI INDICATOR 7.5 (GLOVE) ×2
GLOVE BIOGEL PI INDICATOR 8 (GLOVE) ×4
GLOVE ORTHO TXT STRL SZ7.5 (GLOVE) ×6 IMPLANT
GLOVE SURG ORTHO 8.0 STRL STRW (GLOVE) ×6 IMPLANT
GOWN STRL REUS W/ TWL LRG LVL3 (GOWN DISPOSABLE) ×1 IMPLANT
GOWN STRL REUS W/ TWL XL LVL3 (GOWN DISPOSABLE) ×1 IMPLANT
GOWN STRL REUS W/TWL 2XL LVL3 (GOWN DISPOSABLE) ×3 IMPLANT
GOWN STRL REUS W/TWL LRG LVL3 (GOWN DISPOSABLE) ×3
GOWN STRL REUS W/TWL XL LVL3 (GOWN DISPOSABLE) ×3
HANDPIECE INTERPULSE COAX TIP (DISPOSABLE)
HOOD PEEL AWAY FACE SHEILD DIS (HOOD) ×3 IMPLANT
IMMOBILIZER KNEE 22 UNIV (SOFTGOODS) ×3 IMPLANT
KIT BASIN OR (CUSTOM PROCEDURE TRAY) ×3 IMPLANT
KIT ROOM TURNOVER OR (KITS) ×3 IMPLANT
MANIFOLD NEPTUNE II (INSTRUMENTS) ×3 IMPLANT
NDL MAYO TROCAR (NEEDLE) IMPLANT
NEEDLE 22X1 1/2 (OR ONLY) (NEEDLE) ×3 IMPLANT
NEEDLE MAYO TROCAR (NEEDLE) IMPLANT
NS IRRIG 1000ML POUR BTL (IV SOLUTION) ×3 IMPLANT
PACK TOTAL JOINT (CUSTOM PROCEDURE TRAY) ×3 IMPLANT
PACK UNIVERSAL I (CUSTOM PROCEDURE TRAY) ×3 IMPLANT
PAD ABD 8X10 STRL (GAUZE/BANDAGES/DRESSINGS) ×2 IMPLANT
PAD ARMBOARD 7.5X6 YLW CONV (MISCELLANEOUS) ×6 IMPLANT
PRESSURIZER FEMORAL UNIV (MISCELLANEOUS) IMPLANT
SET HNDPC FAN SPRY TIP SCT (DISPOSABLE) IMPLANT
SPONGE GAUZE 4X4 12PLY STER LF (GAUZE/BANDAGES/DRESSINGS) ×2 IMPLANT
STAPLER VISISTAT 35W (STAPLE) ×3 IMPLANT
SUCTION FRAZIER TIP 10 FR DISP (SUCTIONS) ×3 IMPLANT
SUT ETHIBOND 2 V 37 (SUTURE) ×6 IMPLANT
SUT VIC AB 0 CT1 27 (SUTURE) ×3
SUT VIC AB 0 CT1 27XBRD ANBCTR (SUTURE) ×1 IMPLANT
SUT VIC AB 2-0 CT1 27 (SUTURE) ×6
SUT VIC AB 2-0 CT1 TAPERPNT 27 (SUTURE) ×2 IMPLANT
SYR CONTROL 10ML LL (SYRINGE) ×3 IMPLANT
TAPE CLOTH SURG 4X10 WHT LF (GAUZE/BANDAGES/DRESSINGS) ×2 IMPLANT
TOWEL OR 17X24 6PK STRL BLUE (TOWEL DISPOSABLE) ×3 IMPLANT
TOWEL OR 17X26 10 PK STRL BLUE (TOWEL DISPOSABLE) ×3 IMPLANT
TOWER CARTRIDGE SMART MIX (DISPOSABLE) IMPLANT
WATER STERILE IRR 1000ML POUR (IV SOLUTION) ×1 IMPLANT

## 2015-03-15 NOTE — Transfer of Care (Signed)
Immediate Anesthesia Transfer of Care Note  Patient: Joshua Clark  Procedure(s) Performed: Procedure(s): RIGHT TOTAL HIP ARTHROPLASTY (Right)  Patient Location: PACU  Anesthesia Type:General  Level of Consciousness: awake, alert , oriented and sedated  Airway & Oxygen Therapy: Patient Spontanous Breathing and Patient connected to nasal cannula oxygen  Post-op Assessment: Report given to RN, Post -op Vital signs reviewed and stable and Patient moving all extremities  Post vital signs: Reviewed and stable  Last Vitals:  Filed Vitals:   03/15/15 0909  BP: 181/97  Pulse:   Temp:     Complications: No apparent anesthesia complications

## 2015-03-15 NOTE — Interval H&P Note (Signed)
History and Physical Interval Note:  03/15/2015 7:21 AM  Joshua Clark  has presented today for surgery, with the diagnosis of OA RIGHT HIP  The various methods of treatment have been discussed with the patient and family. After consideration of risks, benefits and other options for treatment, the patient has consented to  Procedure(s): RIGHT TOTAL HIP ARTHROPLASTY (Right) as a surgical intervention .  The patient's history has been reviewed, patient examined, no change in status, stable for surgery.  I have reviewed the patient's chart and labs.  Questions were answered to the patient's satisfaction.     Taylynn Easton JR,W D

## 2015-03-15 NOTE — Discharge Instructions (Signed)

## 2015-03-15 NOTE — Evaluation (Signed)
Physical Therapy Evaluation Patient Details Name: Joshua Clark MRN: [id number] DOB: Sep 28, 1950 Today's Date: 03/15/2015   History of Present Illness  Patient is a 64 y/o male s/p R THA. PMH includes depression, anxiety, GERD.  Clinical Impression  Patient presents with pain and post surgical deficits RLE s/p R THA. Education provided on hip precautions. Instructed pt in exercises. Pt tolerated short distance ambulation to chair with Min guard assist for safety due to impulsivity. Plans to d/c [facility]. Will follow acutely to maximize independence and mobility to prepare pt for d/c to [facility].    Follow Up Recommendations SNF    Equipment Recommendations  None recommended by PT    Recommendations for Other Services       Precautions / Restrictions Precautions Precautions: Posterior Hip Precaution Booklet Issued: No Precaution Comments: Reviewed 3/3 hip precautions. Required Braces or Orthoses: Knee Immobilizer - Right Knee Immobilizer - Right:  (No order for one however donned upon PT arrival.) Restrictions Other Position/Activity Restrictions: No WB status in chart. Assumed WBAT. Will clarify.      Mobility  Bed Mobility Overal bed mobility: Needs Assistance Bed Mobility: Supine to Sit     Supine to sit: Min guard;HOB elevated     General bed mobility comments: Cues for sequencing and safety. Use of rail.   Transfers Overall transfer level: Needs assistance Equipment used: Rolling walker (2 wheeled) Transfers: Sit to/from Stand Sit to Stand: Min guard         General transfer comment: Min guard for safety as pt impulsive.   Ambulation/Gait Ambulation/Gait assistance: Min guard Ambulation Distance (Feet): 6 Feet Assistive device: Rolling walker (2 wheeled) Gait Pattern/deviations: Step-to pattern;Decreased stride length;Decreased stance time - left;Decreased step length - right     General Gait Details: Pt with mildly unsteady gait. Cues to decrease gait  speed and for RW management. Sa02 decreased to high 80s on RA. Donned 02. HR 130 bpm.  Stairs            Wheelchair Mobility    Modified Rankin (Stroke Patients Only)       Balance Overall balance assessment: Needs assistance Sitting-balance support: Feet supported;No upper extremity supported Sitting balance-Leahy Scale: Good     Standing balance support: During functional activity Standing balance-Leahy Scale: Fair                               Pertinent Vitals/Pain Pain Assessment: Faces Faces Pain Scale: Hurts little more Pain Location: right hip with movement. Pain Descriptors / Indicators: Sore;Aching Pain Intervention(s): Monitored during session;Repositioned;Ice applied    Home Living Family/patient expects to be discharged to:: Skilled nursing facility Living Arrangements: Alone                    Prior Function Level of Independence: Independent with assistive device(s)         Comments: Pt using RW amd SPC PTA.     Hand Dominance        Extremity/Trunk Assessment   Upper Extremity Assessment: Defer to OT evaluation           Lower Extremity Assessment: RLE deficits/detail RLE Deficits / Details: Limited AROM/strength secondary to pain/surgery.       Communication   Communication: No difficulties  Cognition Arousal/Alertness: Awake/alert Behavior During Therapy: WFL for tasks assessed/performed Overall Cognitive Status: Within Functional Limits for tasks assessed  General Comments      Exercises Total Joint Exercises Ankle Circles/Pumps: Both;10 reps;Seated Quad Sets: Both;10 reps;Seated Gluteal Sets: Both;10 reps;Seated Long Arc Quad: Right;10 reps;Seated      Assessment/Plan    PT Assessment Patient needs continued PT services  PT Diagnosis Difficulty walking;Generalized weakness;Acute pain   PT Problem List Decreased strength;Pain;Decreased range of motion;Impaired  sensation;Decreased activity tolerance;Decreased balance;Decreased mobility;Decreased knowledge of precautions;Cardiopulmonary status limiting activity  PT Treatment Interventions Balance training;Gait training;Functional mobility training;Therapeutic activities;Therapeutic exercise;Patient/family education   PT Goals (Current goals can be found in the Care Plan section) Acute Rehab PT Goals Patient Stated Goal: to walk pain free PT Goal Formulation: With patient Time For Goal Achievement: 03/29/15 Potential to Achieve Goals: Good    Frequency 7X/week   Barriers to discharge Decreased caregiver support      Co-evaluation               End of Session Equipment Utilized During Treatment: Gait belt;Right knee immobilizer Activity Tolerance: Patient tolerated treatment well Patient left: in chair;with call bell/phone within reach Nurse Communication: Mobility status         Time: 1610-9604 PT Time Calculation (min) (ACUTE ONLY): 33 min   Charges:   PT Evaluation $Initial PT Evaluation Tier I: 1 Procedure PT Treatments $Therapeutic Activity: 8-22 mins   PT G Codes:        Kymberli Wiegand A Mikyla Schachter 03/15/2015, 4:43 PM Mylo Red, PT, DPT [phone]

## 2015-03-15 NOTE — Anesthesia Preprocedure Evaluation (Addendum)
Anesthesia Evaluation  Patient identified by MRN, date of birth, ID band Patient awake    Reviewed: Allergy & Precautions, NPO status , Patient's Chart, lab work & pertinent test results  Airway Mallampati: III  TM Distance: >3 FB Neck ROM: Full    Dental  (+) Dental Advisory Given   Pulmonary former smoker,  breath sounds clear to auscultation        Cardiovascular negative cardio ROS  Rhythm:Regular Rate:Normal     Neuro/Psych Anxiety Depression negative neurological ROS     GI/Hepatic Neg liver ROS, GERD-  ,  Endo/Other  negative endocrine ROS  Renal/GU negative Renal ROS     Musculoskeletal  (+) Arthritis -,   Abdominal   Peds  Hematology negative hematology ROS (+)   Anesthesia Other Findings   Reproductive/Obstetrics                           Anesthesia Physical Anesthesia Plan  ASA: II  Anesthesia Plan: General   Post-op Pain Management:    Induction: Intravenous  Airway Management Planned: Oral ETT  Additional Equipment:   Intra-op Plan:   Post-operative Plan: Extubation in OR  Informed Consent: I have reviewed the patients History and Physical, chart, labs and discussed the procedure including the risks, benefits and alternatives for the proposed anesthesia with the patient or authorized representative who has indicated his/her understanding and acceptance.   Dental advisory given  Plan Discussed with: CRNA  Anesthesia Plan Comments:        Anesthesia Quick Evaluation

## 2015-03-15 NOTE — Anesthesia Postprocedure Evaluation (Signed)
  Anesthesia Post-op Note  Patient: Joshua Clark  Procedure(s) Performed: Procedure(s): RIGHT TOTAL HIP ARTHROPLASTY (Right)  Patient Location: PACU  Anesthesia Type:General  Level of Consciousness: awake and alert   Airway and Oxygen Therapy: Patient Spontanous Breathing  Post-op Pain: mild  Post-op Assessment: Post-op Vital signs reviewed and Patient's Cardiovascular Status Stable     RLE Motor Response: Purposeful movement RLE Sensation: Full sensation      Post-op Vital Signs: Reviewed and stable  Last Vitals:  Filed Vitals:   03/15/15 1414  BP: 108/67  Pulse: 94  Temp: 36.7 C  Resp: 16    Complications: No apparent anesthesia complications

## 2015-03-15 NOTE — Brief Op Note (Signed)
03/15/2015  12:56 PM  PATIENT:  Helane Gunther  64 y.o. male  PRE-OPERATIVE DIAGNOSIS:  OSTEOARTHRITIS RIGHT HIP  POST-OPERATIVE DIAGNOSIS:  OSTEOARTHRITIS RIGHT HIP  PROCEDURE:  Procedure(s): RIGHT TOTAL HIP ARTHROPLASTY (Right)  SURGEON:  Surgeon(s) and Role:    * Frederico Hamman, MD - Primary  PHYSICIAN ASSISTANT: Margart Sickles, PA-C  ASSISTANTS: x1   ANESTHESIA:   general  EBL:  Total I/O In: 1000 [I.V.:1000] Out: -   BLOOD ADMINISTERED:none  DRAINS: none   LOCAL MEDICATIONS USED:  NONE  SPECIMEN:  No Specimen  DISPOSITION OF SPECIMEN:  N/A  COUNTS:  YES  TOURNIQUET:  * No tourniquets in log *  DICTATION: .Other Dictation: Dictation Number unknown  PLAN OF CARE: Admit to inpatient   PATIENT DISPOSITION:  PACU - hemodynamically stable.   Delay start of Pharmacological VTE agent (>24hrs) due to surgical blood loss or risk of bleeding: yes

## 2015-03-15 NOTE — H&P (View-Only) (Signed)
TOTAL HIP ADMISSION H&P  Patient is admitted for right total hip arthroplasty.  Subjective:  Chief Complaint: right hip pain  HPI: Joshua Clark, 64 y.o. male, has a history of pain and functional disability in the right hip(s) due to arthritis and patient has failed non-surgical conservative treatments for greater than 12 weeks to include NSAID's and/or analgesics, corticosteriod injections, use of assistive devices and activity modification.  Onset of symptoms was gradual starting 5 years ago with gradually worsening course since that time.The patient noted no past surgery on the right hip(s).  Patient currently rates pain in the right hip at 10 out of 10 with activity. Patient has night pain, worsening of pain with activity and weight bearing, trendelenberg gait, pain that interfers with activities of daily living, pain with passive range of motion, crepitus and joint swelling. Patient has evidence of subchondral cysts, periarticular osteophytes and joint space narrowing by imaging studies. This condition presents safety issues increasing the risk of falls.  There is no current active infection.  There are no active problems to display for this patient.  Past Medical History  Diagnosis Date  . Depression   . GERD (gastroesophageal reflux disease)   . Anxiety     Past Surgical History  Procedure Laterality Date  . Fracture surgery       (Not in a hospital admission) No Known Allergies  History  Substance Use Topics  . Smoking status: Current Every Day Smoker -- 0.50 packs/day for 5 years  . Smokeless tobacco: Not on file  . Alcohol Use: 0.0 oz/week    4-6 Cans of beer per week    Family History  Problem Relation Age of Onset  . Hypertension Mother      Review of Systems  Musculoskeletal: Positive for joint pain. Negative for falls.  Psychiatric/Behavioral: The patient is nervous/anxious.   All other systems reviewed and are negative.   Objective:  Physical Exam   Constitutional: He is oriented to person, place, and time. He appears well-developed and well-nourished. No distress.  HENT:  Head: Normocephalic and atraumatic.  Nose: Nose normal.  Eyes: Conjunctivae and EOM are normal. Pupils are equal, round, and reactive to light.  Neck: Normal range of motion. Neck supple.  Cardiovascular: Normal rate, regular rhythm, normal heart sounds and intact distal pulses.   Respiratory: Effort normal and breath sounds normal. No respiratory distress. He has no wheezes.  GI: Soft. Bowel sounds are normal. He exhibits no distension. There is no tenderness.  Musculoskeletal:       Right hip: He exhibits decreased range of motion, tenderness, bony tenderness, swelling and crepitus.  Lymphadenopathy:    He has no cervical adenopathy.  Neurological: He is alert and oriented to person, place, and time. No cranial nerve deficit.  Skin: Skin is warm and dry. No Clark noted. No erythema.  Psychiatric: He has a normal mood and affect. His behavior is normal.    Vital signs in last 24 hours: @  Labs:   Estimated body mass index is 27.25 kg/(m^2) as calculated from the following:   Height as of 10/28/12: 6' (1.829 m).   Weight as of 10/28/12: 91.173 kg (201 lb).   Imaging Review Plain radiographs demonstrate severe degenerative joint disease of the right hip(s). The bone quality appears to be good for age and reported activity level.  Assessment/Plan:  End stage arthritis, right hip(s)  The patient history, physical examination, clinical judgement of the provider and imaging studies are consistent with end  stage degenerative joint disease of the right hip(s) and total hip arthroplasty is deemed medically necessary. The treatment options including medical management, injection therapy, arthroscopy and arthroplasty were discussed at length. The risks and benefits of total hip arthroplasty were presented and reviewed. The risks due to aseptic loosening,  infection, stiffness, dislocation/subluxation,  thromboembolic complications and other imponderables were discussed.  The patient acknowledged the explanation, agreed to proceed with the plan and consent was signed. Patient is being admitted for inpatient treatment for surgery, pain control, PT, OT, prophylactic antibiotics, VTE prophylaxis, progressive ambulation and ADL's and discharge planning.The patient is planning to be discharged to skilled nursing facility [facility]

## 2015-03-15 NOTE — Progress Notes (Signed)
Utilization review completed.  

## 2015-03-15 NOTE — Anesthesia Procedure Notes (Signed)
Procedure Name: Intubation Date/Time: 03/15/2015 11:02 AM Performed by: Fransisca Kaufmann Pre-anesthesia Checklist: Patient identified, Emergency Drugs available, Suction available, Patient being monitored and Timeout performed Patient Re-evaluated:Patient Re-evaluated prior to inductionOxygen Delivery Method: Circle system utilized Preoxygenation: Pre-oxygenation with 100% oxygen Intubation Type: IV induction Ventilation: Mask ventilation without difficulty Laryngoscope Size: Miller and 3 Grade View: Grade I Tube type: Oral Tube size: 7.5 mm Number of attempts: 1 Airway Equipment and Method: Stylet Placement Confirmation: ETT inserted through vocal cords under direct vision,  positive ETCO2 and breath sounds checked- equal and bilateral Secured at: 23 cm Tube secured with: Tape Dental Injury: Teeth and Oropharynx as per pre-operative assessment

## 2015-03-16 LAB — BASIC METABOLIC PANEL
ANION GAP: 6 (ref 5–15)
BUN: 14 mg/dL (ref 6–20)
CALCIUM: 8.3 mg/dL — AB (ref 8.9–10.3)
CO2: 26 mmol/L (ref 22–32)
CREATININE: 1.23 mg/dL (ref 0.61–1.24)
Chloride: 102 mmol/L (ref 101–111)
GFR calc non Af Amer: >60 mL/min (ref >60)
GLUCOSE: 108 mg/dL — AB (ref 65–99)
POTASSIUM: 4 mmol/L (ref 3.5–5.1)
Sodium: 134 mmol/L — ABNORMAL LOW (ref 135–145)

## 2015-03-16 LAB — CBC
HEMATOCRIT: 36.6 % — AB (ref 39.0–52.0)
Hemoglobin: 11.7 g/dL — ABNORMAL LOW (ref 13.0–17.0)
MCH: 28.5 pg (ref 26.0–34.0)
MCHC: 32 g/dL (ref 30.0–36.0)
MCV: 89.1 fL (ref 78.0–100.0)
Platelets: 236 10*3/uL (ref 150–400)
RBC: 4.11 MIL/uL — ABNORMAL LOW (ref 4.22–5.81)
RDW: 13.1 % (ref 11.5–15.5)
WBC: 9.9 10*3/uL (ref 4.0–10.5)

## 2015-03-16 NOTE — Progress Notes (Signed)
Physical Therapy Treatment Patient Details Name: Joshua Clark MRN: [id number] DOB: [birth year] Today's Date: 03/16/2015    History of Present Illness Patient is a 64 y/o male s/p R THA. PMH includes depression, anxiety, GERD.    PT Comments    Patient with impulsive mobility.  He reports that he does not have family support upon discharge, therefore needs to be completely independent prior to d/c home alone to private residence.  Will continue to follow patient while on this venue of care to progress mobility.   Follow Up Recommendations  SNF     Equipment Recommendations  None recommended by PT    Recommendations for Other Services OT consult     Precautions / Restrictions Precautions Precautions: Posterior Hip Precaution Booklet Issued: Yes (comment) Precaution Comments: Reviewed 3/3 hip precautions. pt recalled 1/3 Required Braces or Orthoses: Knee Immobilizer - Right Knee Immobilizer - Right:  (per evaluating PT, no specific order received) Restrictions Weight Bearing Restrictions: No Other Position/Activity Restrictions: No WB status in chart. Assumed WBAT. Still no clarification order    Mobility  Bed Mobility Overal bed mobility: Needs Assistance Bed Mobility: Supine to Sit     Supine to sit: Supervision     General bed mobility comments: cues for safety  Transfers Overall transfer level: Needs assistance Equipment used: Rolling walker (2 wheeled) Transfers: Sit to/from Stand Sit to Stand: Min guard         General transfer comment: pt impulsive, 1 LOB upon standing as he felt dizzy  Ambulation/Gait Ambulation/Gait assistance: Min guard Ambulation Distance (Feet): 75 Feet Assistive device: Rolling walker (2 wheeled) Gait Pattern/deviations: Step-to pattern;Decreased weight shift to right;Decreased stance time - right     General Gait Details: needs min cues for safety with RW positioning   Stairs            Wheelchair Mobility     Modified Rankin (Stroke Patients Only)       Balance Overall balance assessment: Needs assistance Sitting-balance support: No upper extremity supported;Feet supported Sitting balance-Leahy Scale: Good     Standing balance support: Bilateral upper extremity supported Standing balance-Leahy Scale: Fair Standing balance comment: LOB upon standing                    Cognition Arousal/Alertness: Awake/alert Behavior During Therapy: Impulsive Overall Cognitive Status: Within Functional Limits for tasks assessed                      Exercises Total Joint Exercises Ankle Circles/Pumps: AROM;Both;10 reps;Seated Quad Sets: AROM;Both;10 reps;Supine Short Arc Quad: AROM;Right;10 reps;Supine Heel Slides: AAROM;Right;10 reps;Supine Hip ABduction/ADduction: AAROM;Right;10 reps;Supine    General Comments General comments (skin integrity, edema, etc.): placed pillow between knees after tx for comfort and safety      Pertinent Vitals/Pain Pain Assessment: 0-10 Pain Score: 6  Pain Location: Rt lateral hip Pain Descriptors / Indicators: Sore;Aching Pain Intervention(s): Limited activity within patient's tolerance;Monitored during session    Home Living                      Prior Function            PT Goals (current goals can now be found in the care plan section) Acute Rehab PT Goals Patient Stated Goal: be as independent as possible PT Goal Formulation: With patient Time For Goal Achievement: 03/29/15 Potential to Achieve Goals: Good Progress towards PT goals: Progressing toward goals    Frequency  7X/week  PT Plan Current plan remains appropriate    Co-evaluation             End of Session Equipment Utilized During Treatment: Gait belt;Right knee immobilizer Activity Tolerance: Patient tolerated treatment well Patient left: in chair;with call bell/phone within reach     Time: 1350-1420 PT Time Calculation (min) (ACUTE ONLY): 30  min  Charges:  $Gait Training: 8-22 mins $Therapeutic Exercise: 8-22 mins                    G CodesNestor Lewandowsky, [place] [phone]  Remijio Holleran 03/16/2015, 2:37 PM

## 2015-03-16 NOTE — Progress Notes (Signed)
Subjective: 1 Day Post-Op Procedure(s) (LRB): RIGHT TOTAL HIP ARTHROPLASTY (Right) Patient reports pain as moderate.  No nausea/vomiting, lightheadedness/dizziness, chest pain/sob.  Good appetite.  Objective: Vital signs in last 24 hours: Temp:  [97.3 F (36.3 C)-98.9 F (37.2 C)] 98.9 F (37.2 C) (08/13 0442) Pulse Rate:  [83-116] 111 (08/13 0442) Resp:  [9-19] 19 (08/13 0442) BP: (108-181)/(55-97) 141/62 mmHg (08/13 0442) SpO2:  [96 %-100 %] 96 % (08/13 0442)  Intake/Output from previous day: 08/12 0701 - 08/13 0700 In: 1540 [P.O.:240; I.V.:1300] Out: -  Intake/Output this shift:     Recent Labs  03/15/15 1555 03/16/15 0517  HGB 13.1 11.7*    Recent Labs  03/15/15 1555 03/16/15 0517  WBC 13.8* 9.9  RBC 4.49 4.11*  HCT 40.2 36.6*  PLT 258 236    Recent Labs  03/15/15 1555 03/16/15 0517  NA  --  134*  K  --  4.0  CL  --  102  CO2  --  26  BUN  --  14  CREATININE 1.12 1.23  GLUCOSE  --  108*  CALCIUM  --  8.3*   No results for input(s): LABPT, INR in the last 72 hours.  Neurologically intact Neurovascular intact Sensation intact distally Intact pulses distally Dorsiflexion/Plantar flexion intact Compartment soft  No drainage noted through dressing Negative homans bilaterally  Assessment/Plan: 1 Day Post-Op Procedure(s) (LRB): RIGHT TOTAL HIP ARTHROPLASTY (Right) Advance diet Up with therapy D/C IV fluids Discharge to SNF Monday to [facility] RLE-posterior hip precautions ABLA- mild and stable Dry dressing change prn  Otilio Saber 03/16/2015, 8:53 AM

## 2015-03-16 NOTE — Op Note (Signed)
NAME:  Joshua Clark, Joshua Clark NO.:  [id number]  MEDICAL RECORD NO.:  [id number]  LOCATION:  [id number]                        FACILITY:  [facility]  PHYSICIAN:  Dyke Brackett, M.D.    DATE OF BIRTH:  1950/10/02  DATE OF PROCEDURE:  03/15/2015 DATE OF DISCHARGE:                              OPERATIVE REPORT   PREOPERATIVE DIAGNOSIS:  Severe osteoarthritis, right hip.  POSTOPERATIVE DIAGNOSIS:  Severe osteoarthritis, right hip.  OPERATION:  Right total hip replacement (AML 15 mm narrow stature stem +5 mm neck length, 36 mm hip ball, 56 mm acetabulum with marathon poly with Gription).  SURGEON:  Dyke Brackett, M.D.  ASSISTANT:  Margart Sickles, PA-C.  ANESTHESIA:  General with local supplementation.  ESTIMATED BLOOD LOSS:  Approximately 300.  DESCRIPTION OF PROCEDURE:  Sterile prep and drape, lateral position, posterior approach to the hip made splitting the iliotibial band, fascia lata, and gluteus maximus fascia.  Short external rotators were split. Hip was dislocated.  We cut the diseased femoral head about 1 fingerbreadth above the lesser trochanter followed by progressive rasping and reaming to accept a 15 mm small-statured AML stem.  Attention was next directed to the acetabulum.  Acetabular wing retractors were placed superiorly and posteriorly.  The acetabulum was freed, labrum was debrided.  Moderate erosion was noted with enlargement of the acetabulum and some superior migration.  We Medialized the acetabulum, then reamed it progressively up 1 mm under reaming for a 56 mm cup, trial cup did not bottom out, we then placed a Gription cup with sector holes in it with about 45 to 50 degrees of abduction, 10 to 50 degrees of anteversion.  We then placed 1 acetabular screw for additional fixation.  The cup was noted to be extremely stable.  Trial reduction was carried out with the broach and the trial acetabulum.  We could flex the hip to 100 degrees internal rotation,  full adduction and the hip would tend to sublux at about 60 degrees of internal rotation. This was noted to be acceptable.  Leg lengths were approximately equal. Trial components were removed.  The final cup had been placed.  We placed the poly with the 10-degree lip liner at about the 10 o'clock position followed by placement of the 15 mm small-statured stem.  We trialed off the stem and settled on the +5 mm neck length, 36 mm all metal hip ball.  We split the capsule, T capsulotomy was closed with interrupted Ethibond, the fascia lata with #1 Ethibond, the subcutaneous tissues with 2-0 Vicryl, skin with skin clips.  Lightly compressive sterile dressing applied, taken to the recovery room in a stable condition.     Dyke Brackett, M.D.     WDC/MEDQ  D:  03/15/2015  T:  03/16/2015  Job:  [id number]

## 2015-03-17 LAB — CBC
HEMATOCRIT: 37.2 % — AB (ref 39.0–52.0)
Hemoglobin: 12 g/dL — ABNORMAL LOW (ref 13.0–17.0)
MCH: 28.8 pg (ref 26.0–34.0)
MCHC: 32.3 g/dL (ref 30.0–36.0)
MCV: 89.2 fL (ref 78.0–100.0)
PLATELETS: 255 10*3/uL (ref 150–400)
RBC: 4.17 MIL/uL — AB (ref 4.22–5.81)
RDW: 12.9 % (ref 11.5–15.5)
WBC: 12.3 10*3/uL — ABNORMAL HIGH (ref 4.0–10.5)

## 2015-03-17 MED ORDER — ALUM & MAG HYDROXIDE-SIMETH 200-200-20 MG/5ML PO SUSP: 15.0000 mL | Freq: Four times a day (QID) | ORAL | Status: DC | PRN

## 2015-03-17 NOTE — Progress Notes (Signed)
Subjective: 2 Days Post-Op Procedure(s) (LRB): RIGHT TOTAL HIP ARTHROPLASTY (Right) Patient reports pain as mild.  Overall doing well  Objective: Vital signs in last 24 hours: Temp:  [97.5 F (36.4 C)-99.1 F (37.3 C)] 99.1 F (37.3 C) (08/14 0642) Pulse Rate:  [113-114] 113 (08/14 0642) Resp:  [17-20] 18 (08/14 0642) BP: (124-141)/(64-69) 124/64 mmHg (08/14 0642) SpO2:  [94 %-96 %] 96 % (08/14 0642)  Intake/Output from previous day: 08/13 0701 - 08/14 0700 In: 600 [P.O.:600] Out: -  Intake/Output this shift:     Recent Labs  03/15/15 1555 03/16/15 0517  HGB 13.1 11.7*    Recent Labs  03/15/15 1555 03/16/15 0517  WBC 13.8* 9.9  RBC 4.49 4.11*  HCT 40.2 36.6*  PLT 258 236    Recent Labs  03/15/15 1555 03/16/15 0517  NA  --  134*  K  --  4.0  CL  --  102  CO2  --  26  BUN  --  14  CREATININE 1.12 1.23  GLUCOSE  --  108*  CALCIUM  --  8.3*   No results for input(s): LABPT, INR in the last 72 hours.  Neurologically intact Neurovascular intact Sensation intact distally Intact pulses distally Dorsiflexion/Plantar flexion intact Incision: scant drainage No cellulitis present Compartment soft  Dressing changed by me today  Assessment/Plan: 2 Days Post-Op Procedure(s) (LRB): RIGHT TOTAL HIP ARTHROPLASTY (Right) Advance diet Up with therapy Discharge to [facility] Monday WBAT RLE-posterior hip precautions ABLA-mild and stable Dry dressing change prn  Otilio Saber 03/17/2015, 8:50 AM

## 2015-03-17 NOTE — Care Management Note (Signed)
Case Management Note  Patient Details  Name: IZAAC REISIG MRN: [id number] Date of Birth: 1950/09/07  Subjective/Objective:                   RIGHT TOTAL HIP ARTHROPLASTY (Right) Action/Plan:  Discharge planning Expected Discharge Date:  03/17/15                Expected Discharge Plan:  Skilled Nursing Facility  In-House Referral:     Discharge planning Services  CM Consult  Post Acute Care Choice:    Choice offered to:     DME Arranged:    DME Agency:     HH Arranged:    HH Agency:     Status of Service:  Completed, signed off  Medicare Important Message Given:    Date Medicare IM Given:    Medicare IM give by:    Date Additional Medicare IM Given:    Additional Medicare Important Message give by:     If discussed at Long Length of Stay Meetings, dates discussed:    Additional Comments: CM notes plan for pt is SNF; CSW arranging.  No other CM needs were communicated. Yves Dill, RN 03/17/2015, 3:00 PM

## 2015-03-17 NOTE — Progress Notes (Signed)
Physical Therapy Treatment Patient Details Name: Joshua Clark MRN: [id number] DOB: 1950-08-21 Today's Date: 03/17/2015    History of Present Illness Patient is a 64 y/o male s/p R THA. PMH includes depression, anxiety, GERD.    PT Comments    Pt progressing with PT goals/mobility as he increased ambulation distance but he cont's to be impulsive as he moves quickly, requiring cues for increased safety awareness.  Cont to follow acutely & follow POC.     Follow Up Recommendations  SNF     Equipment Recommendations  None recommended by PT    Recommendations for Other Services OT consult     Precautions / Restrictions Precautions Precautions: Posterior Hip Precaution Comments: Reviewed 3/3 hip precautions. pt recalled 1/3 Required Braces or Orthoses: Knee Immobilizer - Right Knee Immobilizer - Right:  (per evaluating PT, no specific order received) Restrictions Weight Bearing Restrictions: No    Mobility  Bed Mobility                  Transfers Overall transfer level: Needs assistance Equipment used: Rolling walker (2 wheeled) Transfers: Sit to/from Stand Sit to Stand: Min guard         General transfer comment: guarding for safety as pt impulsive- stands quickly without RW in front of him, & lets go of AD to perform 180 degree turn to fix linens in chair before sitting.    Ambulation/Gait Ambulation/Gait assistance: Min guard Ambulation Distance (Feet): 140 Feet Assistive device: Rolling walker (2 wheeled) Gait Pattern/deviations: Decreased weight shift to right;Decreased step length - left;Step-through pattern     General Gait Details: cues for safety as pt moving fast- encouraged slower steps & increased weight shift to RLE.    Stairs            Wheelchair Mobility    Modified Rankin (Stroke Patients Only)       Balance                                    Cognition Arousal/Alertness: Awake/alert Behavior During  Therapy: Impulsive Overall Cognitive Status: Within Functional Limits for tasks assessed                      Exercises Total Joint Exercises Ankle Circles/Pumps: AROM;Both;10 reps Quad Sets: AROM;Both;10 reps Heel Slides: AROM;Strengthening;Right;10 reps Hip ABduction/ADduction: AAROM;Strengthening;Right;10 reps Straight Leg Raises: AAROM;Strengthening;Right;10 reps Long Arc Quad: AROM;Strengthening;Right;10 reps Marching in Standing: AROM;Strengthening;Right;10 reps    General Comments        Pertinent Vitals/Pain Pain Assessment: 0-10 Pain Score: 5  Pain Location: Rt hip Pain Descriptors / Indicators: Aching;Constant Pain Intervention(s): Monitored during session;Repositioned    Home Living                      Prior Function            PT Goals (current goals can now be found in the care plan section) Acute Rehab PT Goals Patient Stated Goal: be as independent as possible PT Goal Formulation: With patient Time For Goal Achievement: 03/29/15 Potential to Achieve Goals: Good Progress towards PT goals: Progressing toward goals    Frequency  7X/week    PT Plan Current plan remains appropriate    Co-evaluation             End of Session Equipment Utilized During Treatment: Gait belt Activity Tolerance: Patient tolerated treatment  well Patient left: in chair;with call bell/phone within reach     Time: 1024-1044 PT Time Calculation (min) (ACUTE ONLY): 20 min  Charges:  $Gait Training: 8-22 mins                    G Codes:      Lara Mulch 03/17/2015, 12:25 PM   Verdell Face, PTA [phone] 03/17/2015

## 2015-03-17 NOTE — Evaluation (Signed)
Occupational Therapy Evaluation Patient Details Name: Joshua Clark MRN: [id number] DOB: 1950/11/16 Today's Date: 03/17/2015    History of Present Illness Patient is a 64 y/o male s/p R THA. PMH includes depression, anxiety, GERD.   Clinical Impression   Pt is s/p THA resulting in the deficits listed below (see OT Problem List).  Pt will benefit from skilled OT to increase their safety and independence with ADL and functional mobility for ADL to facilitate discharge to venue listed below.        Follow Up Recommendations  SNF    Equipment Recommendations  Other (comment) (TBD)    Recommendations for Other Services       Precautions / Restrictions Precautions Precautions: Posterior Hip Precaution Comments: Reviewed 3/3 hip precautions. pt recalled 1/3 Required Braces or Orthoses: Knee Immobilizer - Right Knee Immobilizer - Right:  (per evaluating PT, no specific order received) Restrictions Weight Bearing Restrictions: No      Mobility Bed Mobility   Bed Mobility: Supine to Sit     Supine to sit: Min guard     General bed mobility comments: cues for safety  Transfers Overall transfer level: Needs assistance Equipment used: Rolling walker (2 wheeled) Transfers: Sit to/from Stand Sit to Stand: Min guard         General transfer comment: pt moves quickly         ADL Overall ADL's : Needs assistance/impaired                     Lower Body Dressing: Sit to/from stand;Maximal assistance;Adhering to hip precautions   Toilet Transfer: RW;Minimal Dentist Details (indicate cue type and reason): sit to stand Toileting- Clothing Manipulation and Hygiene: Maximal assistance;Cueing for safety;Cueing for sequencing;Adhering to hip precautions;Sit to/from stand         General ADL Comments: Plan is SNF. Pt will need AE as he has posterior precautions. Educated on where to obtain     Vision     Perception     Praxis       Pertinent Vitals/Pain Pain Assessment: 0-10 Pain Score: 5  Pain Location: r hip Pain Descriptors / Indicators: Aching;Sore Pain Intervention(s): Limited activity within patient's tolerance;Repositioned     Hand Dominance     Extremity/Trunk Assessment Upper Extremity Assessment Upper Extremity Assessment: Overall WFL for tasks assessed           Communication Communication Communication: No difficulties   Cognition Arousal/Alertness: Awake/alert Behavior During Therapy: Impulsive Overall Cognitive Status: Within Functional Limits for tasks assessed                                Home Living Family/patient expects to be discharged to:: Skilled nursing facility Living Arrangements: Alone                                      Prior Functioning/Environment Level of Independence: Independent with assistive device(s)             OT Diagnosis: Generalized weakness   OT Problem List: Decreased strength;Decreased activity tolerance;Decreased knowledge of precautions;Decreased knowledge of use of DME or AE;Decreased safety awareness   OT Treatment/Interventions: Self-care/ADL training;DME and/or AE instruction;Patient/family education    OT Goals(Current goals can be found in the care plan section) Acute Rehab OT Goals Patient Stated Goal: be as independent as  possible OT Goal Formulation: With patient Time For Goal Achievement: 03/31/15 Potential to Achieve Goals: Good  OT Frequency: Min 2X/week   Barriers to D/C: Decreased caregiver support             End of Session Equipment Utilized During Treatment: Rolling walker Nurse Communication: Mobility status  Activity Tolerance: Patient tolerated treatment well Patient left: in chair   Time: 1530-1549 OT Time Calculation (min): 19 min Charges:  OT General Charges $OT Visit: 1 Procedure OT Evaluation $Initial OT Evaluation Tier I: 1 Procedure G-Codes:    Alba Cory 03/17/2015, 4:40 PM

## 2015-03-18 ENCOUNTER — Encounter (HOSPITAL_COMMUNITY): Payer: Self-pay | Admitting: Orthopedic Surgery

## 2015-03-18 LAB — CBC
HCT: 35.3 % — ABNORMAL LOW (ref 39.0–52.0)
Hemoglobin: 11.5 g/dL — ABNORMAL LOW (ref 13.0–17.0)
MCH: 29.3 pg (ref 26.0–34.0)
MCHC: 32.6 g/dL (ref 30.0–36.0)
MCV: 90.1 fL (ref 78.0–100.0)
Platelets: 247 10*3/uL (ref 150–400)
RBC: 3.92 MIL/uL — AB (ref 4.22–5.81)
RDW: 12.8 % (ref 11.5–15.5)
WBC: 10.1 10*3/uL (ref 4.0–10.5)

## 2015-03-18 MED ORDER — ENOXAPARIN SODIUM 30 MG/0.3ML ~~LOC~~ SOLN: 30.0000 mg | Freq: Two times a day (BID) | SUBCUTANEOUS | Status: DC

## 2015-03-18 MED ORDER — OXYCODONE-ACETAMINOPHEN 5-325 MG PO TABS: 1.0000 | ORAL_TABLET | ORAL | Status: AC | PRN

## 2015-03-18 MED ORDER — OXYCODONE-ACETAMINOPHEN 5-325 MG PO TABS: 1.0000 | ORAL_TABLET | ORAL | Status: DC | PRN

## 2015-03-18 NOTE — Discharge Planning (Signed)
Patient to be discharged to [facility]. Patient updated at bedside.  Facility: [facility] RN report number: [phone] Transportation: [facility] [phone] Clinical Social Work Department Orthopedics [phone]) and Surgical [phone])

## 2015-03-18 NOTE — Progress Notes (Signed)
Physical Therapy Treatment Patient Details Name: Joshua Clark MRN: [id number] DOB: [birth year] Today's Date: 03/18/2015    History of Present Illness Patient is a 64 y/o male s/p R THA. PMH includes depression, anxiety, GERD.    PT Comments    Patient continues to be slightly impulsive with mobility. He stated "its all or nothing with me". Plan is to DC to SNF for ongoing therapy to increase functional independence prior to returning home alone.   Follow Up Recommendations  SNF     Equipment Recommendations  None recommended by PT    Recommendations for Other Services       Precautions / Restrictions Precautions Precautions: Posterior Hip Precaution Comments: Reviewed 3/3 hip precautions. Restrictions Weight Bearing Restrictions: Yes RLE Weight Bearing: Weight bearing as tolerated    Mobility  Bed Mobility Overal bed mobility: Needs Assistance Bed Mobility: Sit to Supine     Supine to sit: Supervision Sit to supine: Supervision   General bed mobility comments: cues for safety and precautions  Transfers Overall transfer level: Needs assistance Equipment used: Rolling walker (2 wheeled)   Sit to Stand: Min guard         General transfer comment: pt moves quickly, cues for safety.   Ambulation/Gait Ambulation/Gait assistance: Min guard Ambulation Distance (Feet): 200 Feet Assistive device: Rolling walker (2 wheeled) Gait Pattern/deviations: Step-through pattern     General Gait Details: cues for safety as pt moving fast- encouraged slower steps & increased weight shift to RLE. Cues for safety with RW   Stairs            Wheelchair Mobility    Modified Rankin (Stroke Patients Only)       Balance                                    Cognition Arousal/Alertness: Awake/alert Behavior During Therapy: Impulsive Overall Cognitive Status: Within Functional Limits for tasks assessed                      Exercises Total  Joint Exercises Quad Sets: AROM;Both;10 reps Short Arc Quad: AROM;Right;10 reps Heel Slides: AROM;Right;10 reps Hip ABduction/ADduction: AAROM;Right;10 reps    General Comments        Pertinent Vitals/Pain Pain Score: 5  Pain Location: R hip Pain Descriptors / Indicators: Aching;Sore Pain Intervention(s): Monitored during session;Repositioned    Home Living                      Prior Function            PT Goals (current goals can now be found in the care plan section) Progress towards PT goals: Progressing toward goals    Frequency  7X/week    PT Plan Current plan remains appropriate    Co-evaluation             End of Session   Activity Tolerance: Patient tolerated treatment well Patient left: in bed;with call bell/phone within reach     Time: 1004-1021 PT Time Calculation (min) (ACUTE ONLY): 17 min  Charges:  $Gait Training: 8-22 mins                    G Codes:      Fredrich Birks 03/18/2015, 10:27 AM 03/18/2015 Fredrich Birks PTA [phone] pager [phone] office

## 2015-03-18 NOTE — Clinical Social Work Placement (Signed)
   CLINICAL SOCIAL WORK PLACEMENT  NOTE  Date:  03/18/2015  Patient Details  Name: Joshua Clark MRN: [id number] Date of Birth: 03-16-1951  Clinical Social Work is seeking post-discharge placement for this patient at the Skilled  Nursing Facility level of care (*CSW will initial, date and re-position this form in  chart as items are completed):  Yes   Patient/family provided with [facility] Clinical Social Work Department's list of facilities offering this level of care within the geographic area requested by the patient (or if unable, by the patient's family).  Yes   Patient/family informed of their freedom to choose among providers that offer the needed level of care, that participate in Medicare, Medicaid or managed care program needed by the patient, have an available bed and are willing to accept the patient.  Yes   Patient/family informed of [facility]'s ownership interest in [facility] and [facility], as well as of the fact that they are under no obligation to receive care at these facilities.  PASRR submitted to EDS on 03/18/15     PASRR number received on 03/18/15     Existing PASRR number confirmed on  (n/a)     FL2 transmitted to all facilities in geographic area requested by pt/family on 03/18/15     FL2 transmitted to all facilities within larger geographic area on  (n/a)     Patient informed that his/her managed care company has contracts with or will negotiate with certain facilities, including the following:   (yes, [facility].)     Yes   Patient/family informed of bed offers received.  Patient chooses bed at [facility]     Physician recommends and patient chooses bed at  (n/a)    Patient to be transferred to [facility] on 03/18/15.  Patient to be transferred to facility by [facility]     Patient family notified on 03/18/15 of transfer.  Name of family member notified:  Patient updated at bedside.     PHYSICIAN       Additional Comment:     _______________________________________________ Rod Mae, LCSW 03/18/2015, 11:48 AM [phone]

## 2015-03-18 NOTE — Discharge Summary (Signed)
PATIENT ID: Joshua Clark        MRN:  [id number]          DOB/AGE: 63-May-1952 / 63 y.o.    DISCHARGE SUMMARY  ADMISSION DATE:    03/15/2015 DISCHARGE DATE:   03/18/2015   ADMISSION DIAGNOSIS: OA RIGHT HIP    DISCHARGE DIAGNOSIS:  OSTEOARTHRITIS RIGHT HIP    ADDITIONAL DIAGNOSIS: Active Problems:   Primary localized osteoarthritis of right hip  Past Medical History  Diagnosis Date  . Depression   . GERD (gastroesophageal reflux disease)   . Anxiety   . Arthritis   . DDD (degenerative disc disease), lumbar     PROCEDURE: Procedure(s): RIGHT TOTAL HIP ARTHROPLASTY Right on 03/15/2015  CONSULTS: PT/OT/SW      HISTORY:  See H&P in chart  HOSPITAL COURSE:  Joshua Clark is a 64 y.o. admitted on 03/15/2015 and found to have a diagnosis of OSTEOARTHRITIS RIGHT HIP.  After appropriate laboratory studies were obtained  they were taken to the operating room on 03/15/2015 and underwent  Procedure(s): RIGHT TOTAL HIP ARTHROPLASTY  Right.   They were given perioperative antibiotics:      Anti-infectives    Start     Dose/Rate Route Frequency Ordered Stop   03/15/15 1430  ceFAZolin (ANCEF) IVPB 2 g/50 mL premix     2 g 100 mL/hr over 30 Minutes Intravenous Every 6 hours 03/15/15 1425 03/15/15 2144   03/15/15 1000  ceFAZolin (ANCEF) IVPB 2 g/50 mL premix     2 g 100 mL/hr over 30 Minutes Intravenous To ShortStay Surgical 03/14/15 1343 03/15/15 1057    .  Tolerated the procedure well.    POD #1, allowed out of bed to a chair.  PT for ambulation and exercise program.   IV saline locked.  O2 discontionued.  POD #2, continued PT and ambulation.  .  The remainder of the hospital course was dedicated to ambulation and strengthening.   The patient was discharged on 3 Days Post-Op in  Stable condition.  Blood products given:none  DIAGNOSTIC STUDIES: Recent vital signs:  Patient Vitals for the past 24 hrs:  BP Temp Temp src Pulse Resp SpO2  03/18/15 0324 137/86 mmHg 98  F (36.7 C) Oral (!) 111 18 95 %  03/17/15 2217 (!) 141/63 mmHg 98.1 F (36.7 C) Oral 100 19 98 %  03/17/15 1304 (!) 156/81 mmHg 98.9 F (37.2 C) - (!) 117 18 94 %       Recent laboratory studies:  Recent Labs  03/15/15 1555 03/16/15 0517 03/17/15 0735 03/18/15 0430  WBC 13.8* 9.9 12.3* 10.1  HGB 13.1 11.7* 12.0* 11.5*  HCT 40.2 36.6* 37.2* 35.3*  PLT 258 236 255 247    Recent Labs  03/15/15 1555 03/16/15 0517  NA  --  134*  K  --  4.0  CL  --  102  CO2  --  26  BUN  --  14  CREATININE 1.12 1.23  GLUCOSE  --  108*  CALCIUM  --  8.3*   Lab Results  Component Value Date   INR 1.00 03/05/2015     Recent Radiographic Studies :  Dg Chest 2 View  03/05/2015   CLINICAL DATA:  Preop for hip surgery  EXAM: CHEST  2 VIEW  COMPARISON:  Chest x-ray of 10/28/2012  FINDINGS: No active infiltrate or effusion is seen. Mediastinal and hilar contours are unremarkable. The lungs are slightly hyperaerated with some flattening of the hemidiaphragms,  possibly indicating mild emphysematous change. The heart is within normal limits in size. There are mild degenerative changes in the mid to lower thoracic spine.  IMPRESSION: No active cardiopulmonary disease. Slight hyper aeration may indicate mild emphysematous change.   Electronically Signed   By: Dwyane Dee M.D.   On: 03/05/2015 10:16   Dg Hip Port Unilat With Pelvis 1v Right  03/15/2015   CLINICAL DATA:  Right hip replacement .  EXAM: DG HIP (WITH OR WITHOUT PELVIS) 1V PORT RIGHT  COMPARISON:  None.  FINDINGS: Total right hip replacement with good anatomic alignment. No evidence of fracture or dislocation.  IMPRESSION: Total right hip replacement with good anatomic alignment.   Electronically Signed   By: Maisie Fus  Register   On: 03/15/2015 13:47    DISCHARGE INSTRUCTIONS:   DISCHARGE MEDICATIONS:     Medication List    STOP taking these medications        meloxicam 15 MG tablet  Commonly known as:  MOBIC     sertraline 50 MG  tablet  Commonly known as:  ZOLOFT     traMADol 50 MG tablet  Commonly known as:  ULTRAM      TAKE these medications        enoxaparin 30 MG/0.3ML injection  Commonly known as:  LOVENOX  Inject 0.3 mLs (30 mg total) into the skin every 12 (twelve) hours.     MELATONIN PO  Take 2 mg by mouth at bedtime as needed (sleep).     oxyCODONE-acetaminophen 5-325 MG per tablet  Commonly known as:  ROXICET  Take 1-2 tablets by mouth every 4 (four) hours as needed for moderate pain or severe pain.     venlafaxine XR 37.5 MG 24 hr capsule  Commonly known as:  EFFEXOR-XR  Take 37.5 mg by mouth daily before breakfast.        FOLLOW UP VISIT:   Follow-up Information    Follow up with CAFFREY JR,W D, MD. Schedule an appointment as soon as possible for a visit in 2 weeks.   Specialty:  Orthopedic Surgery   Contact information:   [address] [place] [address] [phone]       DISPOSITION:   Skilled Nursing Facility/Rehab  CONDITION:  Stable   Margart Sickles, PA-C  03/18/2015 8:36 AM

## 2015-03-18 NOTE — Clinical Social Work Note (Signed)
Clinical Social Work Assessment  Patient Details  Name: Joshua Clark MRN: [id number] Date of Birth: 07-28-1951  Date of referral:  03/18/15               Reason for consult:  Facility Placement, Discharge Planning                Permission sought to share information with:  Facility Art therapist granted to share information::  Yes, Verbal Permission Granted  Name::     n/a  Agency::  [facility]  Relationship::  SNF  Contact Information:  [phone]  Housing/Transportation Living arrangements for the past 2 months:  Apartment Source of Information:  Patient Patient Interpreter Needed:  None Criminal Activity/Legal Involvement Pertinent to Current Situation/Hospitalization:  No - Comment as needed Significant Relationships:  None, Other(Comment) (Patient's mother with advanced dementia.) Lives with:  Self Do you feel safe going back to the place where you live?  No (High fall risk.) Need for family participation in patient care:  No (Coment) (Patient able to make own decisions.)  Care giving concerns:  Patient expressed no concerns at this time.   Social Worker assessment / plan:  CSW received referral for possible SNF placement at time of discharge. CSW met with patient at bedside to discuss discharge disposition. Per patient, patient lives alone on ground level apartment. Patient states patient has pre-registered with [facility] SNF for short-term rehabilitation at time of discharge. CSW confirmed discharge plans with [facility]. Patient able to be admitted to [facility] once medically stable for discharge.  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  Patient/Family's Understanding  of and Emotional Response to Diagnosis, Current Treatment, and Prognosis:  Patient understanding and agreeable to CSW plan of care.  Emotional Assessment Appearance:  Appears stated age Attitude/Demeanor/Rapport:  Other (Pleasant.) Affect (typically observed):  Accepting, Appropriate, Pleasant Orientation:  Oriented to Self, Oriented to Place, Oriented to  Time, Oriented to Situation Alcohol / Substance use:  Not Applicable Psych involvement (Current and /or in the community):  No (Comment) (Not appropriate on this admission.)  Discharge Needs  Concerns to be addressed:  No discharge needs identified Readmission within the last 30 days:  No Current discharge risk:  None Barriers to Discharge:  No Barriers Identified   Caroline Sauger, LCSW 03/18/2015, 11:44 AM [phone]

## 2015-03-19 ENCOUNTER — Non-Acute Institutional Stay (SKILLED_NURSING_FACILITY): Payer: 59 | Admitting: Internal Medicine

## 2015-03-19 ENCOUNTER — Encounter: Payer: Self-pay | Admitting: Internal Medicine

## 2015-03-19 DIAGNOSIS — K5901 Slow transit constipation: Secondary | ICD-10-CM | POA: Diagnosis not present

## 2015-03-19 DIAGNOSIS — G4700 Insomnia, unspecified: Secondary | ICD-10-CM | POA: Diagnosis not present

## 2015-03-19 DIAGNOSIS — M1611 Unilateral primary osteoarthritis, right hip: Secondary | ICD-10-CM | POA: Diagnosis not present

## 2015-03-19 DIAGNOSIS — F322 Major depressive disorder, single episode, severe without psychotic features: Secondary | ICD-10-CM | POA: Diagnosis not present

## 2015-03-19 DIAGNOSIS — D62 Acute posthemorrhagic anemia: Secondary | ICD-10-CM | POA: Diagnosis not present

## 2015-03-19 DIAGNOSIS — F329 Major depressive disorder, single episode, unspecified: Secondary | ICD-10-CM

## 2015-03-19 NOTE — Progress Notes (Signed)
Patient ID: Joshua Clark, male   DOB: [birth year], 64 y.o.   MRN: [id number]     [facility]  PCP: Irving Copas, MD  Code Status: DNR  No Known Allergies  Chief Complaint  Patient presents with  . New Admit To SNF    New Admission      HPI:  64 y.o. patient is here for short term rehabilitation post hospital admission from 03/15/15-03/18/15 with right hip OA. He underwent right total hip arthroplasty on 03/15/15. His pain is under control. He is seen in his room today. He is getting around with a walker. He has not had a bowel movement since last Thursday. He is passing flatus.   Review of Systems:  Constitutional: Negative for fever, chills, malaise/fatigue and diaphoresis.  HENT: Negative for headache, congestion, nasal discharge  Eyes: Negative for eye pain, blurred vision, double vision and discharge.  Respiratory: Negative for cough, shortness of breath and wheezing.   Cardiovascular: Negative for chest pain, palpitations, leg swelling.  Gastrointestinal: Negative for heartburn, nausea, vomiting, abdominal pain Genitourinary: Negative for dysuria. Positive for nocturia  Musculoskeletal: Negative for back pain, falls Skin: Negative for itching, rash.  Neurological: Negative for dizziness, tingling, focal weakness Psychiatric/Behavioral: Negative for depression.    Past Medical History  Diagnosis Date  . Depression   . GERD (gastroesophageal reflux disease)   . Anxiety   . Arthritis   . DDD (degenerative disc disease), lumbar    Past Surgical History  Procedure Laterality Date  . Fracture surgery Right   . Total hip arthroplasty Right 03/15/2015    Procedure: RIGHT TOTAL HIP ARTHROPLASTY;  Surgeon: Frederico Hamman, MD;  Location: [facility];  Service: Orthopedics;  Laterality: Right;   Social History:   reports that he quit smoking about 12 months ago. He does not have any smokeless tobacco history on file. He reports that he drinks alcohol. He  reports that he does not use illicit drugs.  Family History  Problem Relation Age of Onset  . Hypertension Mother     Medications:   Medication List       This list is accurate as of: 03/19/15  2:51 PM.  Always use your most recent med list.               enoxaparin 30 MG/0.3ML injection  Commonly known as:  LOVENOX  Inject 0.3 mLs (30 mg total) into the skin every 12 (twelve) hours.     MELATONIN PO  Take 2 mg by mouth at bedtime as needed (sleep).     oxyCODONE 5 MG immediate release tablet  Commonly known as:  Oxy IR/ROXICODONE  Take 5 mg by mouth every 4 (four) hours as needed for severe pain (Until PERCOCET is available).     oxyCODONE-acetaminophen 5-325 MG per tablet  Commonly known as:  ROXICET  Take 1-2 tablets by mouth every 4 (four) hours as needed for moderate pain or severe pain.     venlafaxine XR 37.5 MG 24 hr capsule  Commonly known as:  EFFEXOR-XR  Take 37.5 mg by mouth daily before breakfast.         Physical Exam: Filed Vitals:   03/19/15 1448  BP: 126/75  Pulse: 87  Temp: 99 F (37.2 C)  TempSrc: Oral  Resp: 20  SpO2: 96%    General- elderly male, well built, in no acute distress Head- normocephalic, atraumatic Throat- moist mucus membrane Eyes- PERRLA, EOMI, no pallor, no icterus, no discharge, normal  conjunctiva, normal sclera Neck- no cervical lymphadenopathy Cardiovascular- normal s1,s2, no murmurs, palpable dorsalis pedis and radial pulses, trace right leg edema Respiratory- bilateral clear to auscultation, no wheeze, no rhonchi, no crackles, no use of accessory muscles Abdomen- bowel sounds present, soft, non tender Musculoskeletal- able to move all 4 extremities, generalized weakness, using walker Neurological- no focal deficit, alert and oriented to person, place and time Skin- warm and dry, right hip surgical incision healing well, has staples in place and dressing clean and dry Psychiatry- normal mood and affect    Labs  reviewed: Basic Metabolic Panel:  Recent Labs  16/10/96 0955 03/15/15 1555 03/16/15 0517  NA 140  --  134*  K 4.5  --  4.0  CL 106  --  102  CO2 29  --  26  GLUCOSE 108*  --  108*  BUN 29*  --  14  CREATININE 0.93 1.12 1.23  CALCIUM 9.3  --  8.3*   Liver Function Tests:  Recent Labs  03/05/15 0955  AST 21  ALT 23  ALKPHOS 102  BILITOT 0.4  PROT 6.7  ALBUMIN 3.8   No results for input(s): LIPASE, AMYLASE in the last 8760 hours. No results for input(s): AMMONIA in the last 8760 hours. CBC:  Recent Labs  03/05/15 0955  03/16/15 0517 03/17/15 0735 03/18/15 0430  WBC 10.3  < > 9.9 12.3* 10.1  NEUTROABS 6.2  --   --   --   --   HGB 15.1  < > 11.7* 12.0* 11.5*  HCT 45.6  < > 36.6* 37.2* 35.3*  MCV 89.2  < > 89.1 89.2 90.1  PLT 269  < > 236 255 247  < > = values in this interval not displayed. Cardiac Enzymes: No results for input(s): CKTOTAL, CKMB, CKMBINDEX, TROPONINI in the last 8760 hours. BNP: Invalid input(s): POCBNP CBG: No results for input(s): GLUCAP in the last 8760 hours.  Radiological Exams: Dg Chest 2 View  03/05/2015   CLINICAL DATA:  Preop for hip surgery  EXAM: CHEST  2 VIEW  COMPARISON:  Chest x-ray of 10/28/2012  FINDINGS: No active infiltrate or effusion is seen. Mediastinal and hilar contours are unremarkable. The lungs are slightly hyperaerated with some flattening of the hemidiaphragms, possibly indicating mild emphysematous change. The heart is within normal limits in size. There are mild degenerative changes in the mid to lower thoracic spine.  IMPRESSION: No active cardiopulmonary disease. Slight hyper aeration may indicate mild emphysematous change.   Electronically Signed   By: Dwyane Dee M.D.   On: 03/05/2015 10:16    Assessment/Plan  Right hip OA S/p right total hip arthroplasty. Continue percocet 5-325 mg 1-2 tab q4h prn pain. Continue lovenox for dvt prophylaxis. Has follow up with orthopedics. Will have him work with physical  therapy and occupational therapy team to help with gait training and muscle strengthening exercises.fall precautions. Skin care. Encourage to be out of bed.   Blood loss anemia Lab Results  Component Value Date   WBC 10.1 03/18/2015   HGB 11.5* 03/18/2015   HCT 35.3* 03/18/2015   MCV 90.1 03/18/2015   PLT 247 03/18/2015  Monitor h&h  Constipation Add miralax 17 g daily with colace 100 mg bid and monitor, hydration encouraged  Depression Stable mood, continue home regimen effexor, no changes made  Insomnia Stable, on prn melatonin   Goals of care: short term rehabilitation   Labs/tests ordered: cbc in 1 week  Family/ staff Communication: reviewed care plan  with patient and nursing supervisor    Oneal Grout, MD  [facility] [phone] (Monday-Friday 8 am - 5 pm) [phone] (afterhours)

## 2015-04-03 ENCOUNTER — Non-Acute Institutional Stay (SKILLED_NURSING_FACILITY): Payer: 59 | Admitting: Adult Health

## 2015-04-03 ENCOUNTER — Encounter: Payer: Self-pay | Admitting: Adult Health

## 2015-04-03 DIAGNOSIS — G4700 Insomnia, unspecified: Secondary | ICD-10-CM | POA: Diagnosis not present

## 2015-04-03 DIAGNOSIS — M1611 Unilateral primary osteoarthritis, right hip: Secondary | ICD-10-CM | POA: Diagnosis not present

## 2015-04-03 DIAGNOSIS — F329 Major depressive disorder, single episode, unspecified: Secondary | ICD-10-CM

## 2015-04-03 DIAGNOSIS — F322 Major depressive disorder, single episode, severe without psychotic features: Secondary | ICD-10-CM

## 2015-04-03 DIAGNOSIS — K5901 Slow transit constipation: Secondary | ICD-10-CM | POA: Diagnosis not present

## 2015-04-03 NOTE — Progress Notes (Signed)
Patient ID: Joshua Clark, male   DOB: 1950-11-22, 64 y.o.   MRN: [id number]    DATE:  04/03/2015 MRN:  [id number]  BIRTHDAY: 1951/04/22  Facility:  Nursing Home Location:  [facility]  Nursing Home Room Number: 603-P  LEVEL OF CARE:  SNF (31)  Contact Information    Name Relation Home Work [place] Sister   [phone]   Toy Cookey [phone]         Chief Complaint  Patient presents with  . Discharge Note    Osteoarthritis S/P right total hip arthroplasty, depression, insomnia and constipation    HISTORY OF PRESENT ILLNESS:  This is a 64 year old male who is for discharge home with home health PT for endurance, OT for ADLs, CNA for showers and skilled Nurse for medication monitoring. DME: Tub bench for baths. He has been admitted to [facility] on 03/18/15 from [facility]. He has PMH of depression, GERD, anxiety, arthritis and DDD. He has osteoarthritis for which he had right total hip arthroplasty on 03/15/15.  Patient was admitted to this facility for short-term rehabilitation after the patient's recent hospitalization.  Patient has completed SNF rehabilitation and therapy has cleared the patient for discharge.   PAST MEDICAL HISTORY:  Past Medical History  Diagnosis Date  . Depression   . GERD (gastroesophageal reflux disease)   . Anxiety   . Arthritis   . DDD (degenerative disc disease), lumbar      CURRENT MEDICATIONS: Reviewed  Patient's Medications  New Prescriptions   No medications on file  Previous Medications   DOCUSATE SODIUM (COLACE) 100 MG CAPSULE    Take 100 mg by mouth 2 (two) times daily.   MELATONIN PO    Take 2 mg by mouth at bedtime as needed (sleep).   OXYCODONE-ACETAMINOPHEN (ROXICET) 5-325 MG PER TABLET    Take 1-2 tablets by mouth every 4 (four) hours as needed for moderate pain or severe pain.   POLYETHYLENE GLYCOL (MIRALAX / GLYCOLAX) PACKET    Take 17 g by mouth daily.   VENLAFAXINE  XR (EFFEXOR-XR) 37.5 MG 24 HR CAPSULE    Take 37.5 mg by mouth daily before breakfast.  Modified Medications   No medications on file  Discontinued Medications   ENOXAPARIN (LOVENOX) 30 MG/0.3ML INJECTION    Inject 0.3 mLs (30 mg total) into the skin every 12 (twelve) hours.   OXYCODONE (OXY IR/ROXICODONE) 5 MG IMMEDIATE RELEASE TABLET    Take 5 mg by mouth every 4 (four) hours as needed for severe pain (Until PERCOCET is available).     No Known Allergies   REVIEW OF SYSTEMS:  GENERAL: no change in appetite, no fatigue, no weight changes, no fever, chills or weakness EYES: Denies change in vision, dry eyes, eye pain, itching or discharge EARS: Denies change in hearing, ringing in ears, or earache NOSE: Denies nasal congestion or epistaxis MOUTH and THROAT: Denies oral discomfort, gingival pain or bleeding, pain from teeth or hoarseness   RESPIRATORY: no cough, SOB, DOE, wheezing, hemoptysis CARDIAC: no chest pain, edema or palpitations GI: no abdominal pain, diarrhea, constipation, heart burn, nausea or vomiting GU: Denies dysuria, frequency, hematuria, incontinence, or discharge PSYCHIATRIC: Denies feeling of depression or anxiety. No report of hallucinations, insomnia, paranoia, or agitation   PHYSICAL EXAMINATION  GENERAL APPEARANCE: Well nourished. In no acute distress. Normal body habitus SKIN:  Right hip surgical wound has steri-strips, dry, no erythema HEAD: Normal in size and contour. No evidence  of trauma EYES: Lids open and close normally. No blepharitis, entropion or ectropion. PERRL. Conjunctivae are clear and sclerae are white. Lenses are without opacity EARS: Pinnae are normal. Patient hears normal voice tunes of the examiner MOUTH and THROAT: Lips are without lesions. Oral mucosa is moist and without lesions. Tongue is normal in shape, size, and color and without lesions NECK: supple, trachea midline, no neck masses, no thyroid tenderness, no thyromegaly LYMPHATICS:  no LAN in the neck, no supraclavicular LAN RESPIRATORY: breathing is even & unlabored, BS CTAB CARDIAC: RRR, no murmur,no extra heart sounds, no edema GI: abdomen soft, normal BS, no masses, no tenderness, no hepatomegaly, no splenomegaly EXTREMITIES:  Able to move X 4 extremities PSYCHIATRIC: Alert and oriented X 3. Affect and behavior are appropriate  LABS/RADIOLOGY: Labs reviewed: 03/26/15  WBC 8.3 hemoglobin 12.3 hematocrit 36.5 MCV 85.5 platelet count 414 Basic Metabolic Panel:  Recent Labs  09/81/19 0955 03/15/15 1555 03/16/15 0517  NA 140  --  134*  K 4.5  --  4.0  CL 106  --  102  CO2 29  --  26  GLUCOSE 108*  --  108*  BUN 29*  --  14  CREATININE 0.93 1.12 1.23  CALCIUM 9.3  --  8.3*   Liver Function Tests:  Recent Labs  03/05/15 0955  AST 21  ALT 23  ALKPHOS 102  BILITOT 0.4  PROT 6.7  ALBUMIN 3.8   CBC:  Recent Labs  03/05/15 0955  03/16/15 0517 03/17/15 0735 03/18/15 0430  WBC 10.3  < > 9.9 12.3* 10.1  NEUTROABS 6.2  --   --   --   --   HGB 15.1  < > 11.7* 12.0* 11.5*  HCT 45.6  < > 36.6* 37.2* 35.3*  MCV 89.2  < > 89.1 89.2 90.1  PLT 269  < > 236 255 247  < > = values in this interval not displayed.   Dg Chest 2 View  03/05/2015   CLINICAL DATA:  Preop for hip surgery  EXAM: CHEST  2 VIEW  COMPARISON:  Chest x-ray of 10/28/2012  FINDINGS: No active infiltrate or effusion is seen. Mediastinal and hilar contours are unremarkable. The lungs are slightly hyperaerated with some flattening of the hemidiaphragms, possibly indicating mild emphysematous change. The heart is within normal limits in size. There are mild degenerative changes in the mid to lower thoracic spine.  IMPRESSION: No active cardiopulmonary disease. Slight hyper aeration may indicate mild emphysematous change.   Electronically Signed   By: Dwyane Dee M.D.   On: 03/05/2015 10:16   Dg Hip Port Unilat With Pelvis 1v Right  03/15/2015   CLINICAL DATA:  Right hip replacement .  EXAM: DG  HIP (WITH OR WITHOUT PELVIS) 1V PORT RIGHT  COMPARISON:  None.  FINDINGS: Total right hip replacement with good anatomic alignment. No evidence of fracture or dislocation.  IMPRESSION: Total right hip replacement with good anatomic alignment.   Electronically Signed   By: Maisie Fus  Register   On: 03/15/2015 13:47    ASSESSMENT/PLAN:  Osteoarthritis S/P right total hip arthroplasty - for home health PT, OT, CNA and skilled Nurse; continue Percocet 5/325 mg 1-2 tabs by mouth every 4 hours when necessary for pain; follow-up with Dr. Madelon Lips, orthopedic surgeon  Major depression - mood this is stable; continue Effexor 37.5 mg 24 hour 1 capsule by mouth daily  Insomnia - continue melatonin 2 mg 1 tab by mouth daily at bedtime  Constipation - continue  Colace 100 mg by mouth twice a day and MiraLAX 17 g by mouth daily     I have filled out patient's discharge paperwork and written prescriptions.  Patient will receive home health PT, OT, skilled Nurse and CNA.  DME provided:  Tub bench for baths  Total discharge time: Greater than 30 minutes  Discharge time involved coordination of the discharge process with social worker, nursing staff and therapy department. Medical justification for home health services/DME verified.    [facility], NP [employer] [phone]

## 2015-04-04 ENCOUNTER — Ambulatory Visit (INDEPENDENT_AMBULATORY_CARE_PROVIDER_SITE_OTHER): Payer: PPO

## 2015-04-05 ENCOUNTER — Ambulatory Visit: Payer: PPO | Attending: Neurology

## 2015-04-05 DIAGNOSIS — G629 Polyneuropathy, unspecified: Secondary | ICD-10-CM | POA: Insufficient documentation

## 2015-04-05 DIAGNOSIS — M792 Neuralgia and neuritis, unspecified: Secondary | ICD-10-CM | POA: Insufficient documentation

## 2015-04-05 LAB — C_REACTIVE PROTEIN: C_Reactive Protein: 2.8 mg/L (ref 0.0–10.0)

## 2015-04-05 LAB — SED RATE: Erythrocyte Sedimentation Rate: 8 mm/h (ref 0–15)

## 2015-04-06 LAB — ANTI NUCLEAR ANTIBODIES: Antibodies to Nuclear Ags by IF (ANA): NEGATIVE

## 2015-04-06 LAB — HIV ANTIGEN AND ANTIBODY SCRN
HIV Antigen and Antibody Interpretation: NONREACTIVE
HIV Antigen and Antibody Result: NONREACTIVE

## 2015-04-10 LAB — VITAMIN E (A-TOCOPHEROL): Vitamin E (alpha Tocopherol): 17.7 mg/L (ref 5–20)

## 2015-04-11 ENCOUNTER — Ambulatory Visit (INDEPENDENT_AMBULATORY_CARE_PROVIDER_SITE_OTHER): Payer: PPO

## 2015-04-15 ENCOUNTER — Other Ambulatory Visit: Payer: Self-pay

## 2015-04-18 ENCOUNTER — Encounter (INDEPENDENT_AMBULATORY_CARE_PROVIDER_SITE_OTHER): Payer: PPO | Admitting: Neurology

## 2015-04-21 ENCOUNTER — Other Ambulatory Visit (INDEPENDENT_AMBULATORY_CARE_PROVIDER_SITE_OTHER): Payer: Self-pay | Admitting: Cardiovascular Disease

## 2015-04-21 DIAGNOSIS — I10 Essential (primary) hypertension: Secondary | ICD-10-CM

## 2015-04-22 MED ORDER — LISINOPRIL 40 MG OR TABS
ORAL_TABLET | ORAL | Status: DC
Start: 2015-04-22 — End: 2015-10-12

## 2015-04-25 ENCOUNTER — Telehealth (INDEPENDENT_AMBULATORY_CARE_PROVIDER_SITE_OTHER): Payer: Self-pay | Admitting: Internal Medicine

## 2015-04-25 NOTE — Telephone Encounter (Signed)
Called patient, he did not answer.  Message left to call back clinic.

## 2015-04-25 NOTE — Telephone Encounter (Signed)
Let him know he needs to go to er and get this checked ou t

## 2015-04-25 NOTE — Telephone Encounter (Signed)
(  TEXTING IS AN OPTION FOR [facility] CLINICS ONLY)  Is this a [facility] clinic? No      RETURN CALL: Detailed message on voicemail only      SUBJECT:  General Message     REASON FOR REQUEST: Physical Therapist Drue [facility] (referred by Dr. Judi Saa) called to inform Dr. Judi Saa about the patient's symptoms: nauseated, sweating profusely, dizzy, weak and fell over (stroke or heart attack) prior to the patient's Physical Therapy session resulting to Ms. Lubin contacting 911 and the paramedics evaluated the patient and his blood pressure was at 200 over 109. Paramedics recommended that the patient should go to the emergency department, which the patient declined. Paramedics had the patient sign a form declining their recommendation for the patient to be take to an emergency department. Paramedics then allowed the patient to drive himself home. Ms. Carlyle Basques is very concerned about the patient who is home alone with his wife being out of town. Please contact the patient as soon as possible. You may also contact Ms. Carlyle Basques. Thanks!    MESSAGE: Please see above.

## 2015-05-07 ENCOUNTER — Other Ambulatory Visit (INDEPENDENT_AMBULATORY_CARE_PROVIDER_SITE_OTHER): Payer: Self-pay | Admitting: Cardiovascular Disease

## 2015-05-07 ENCOUNTER — Ambulatory Visit (INDEPENDENT_AMBULATORY_CARE_PROVIDER_SITE_OTHER): Payer: PPO | Admitting: Neurology

## 2015-05-07 VITALS — BP 150/110 | Ht 74.25 in | Wt 206.0 lb

## 2015-05-07 DIAGNOSIS — G379 Demyelinating disease of central nervous system, unspecified: Secondary | ICD-10-CM | POA: Insufficient documentation

## 2015-05-07 DIAGNOSIS — G629 Polyneuropathy, unspecified: Secondary | ICD-10-CM

## 2015-05-07 DIAGNOSIS — R251 Tremor, unspecified: Secondary | ICD-10-CM

## 2015-05-07 DIAGNOSIS — G319 Degenerative disease of nervous system, unspecified: Secondary | ICD-10-CM

## 2015-05-07 NOTE — Progress Notes (Signed)
[facility]  Physician: Milus Mallick, MD   Phone: [phone]  Today's date: 05/07/2015     HPI/CHIEF COMPLAINT:  Ronnie Mason is a 64 year old man who returns for a follow up of low back pain, bilateral foot paraesthesia, and gait problems.    He continues to have bilateral foot paraesthesia, right more than left. The tingling in his right foot is now on top of his foot, compared to being on the bottom of his foot during his last visit.     He has imbalance problems, including difficulty standing in the shower. No recent falls. He has dizziness when standing up and when closing his eyes.    He continues to have lower back pain. He has been going to physical therapy and taking pain medication.     He has not had an alcoholic drink for 3 months. Has not missed any doses in his medication. He has not had any urinary or fecal incontinence. His memory is stable. His gait has not slowed.  He has had no falls.    REVIEW OF SYSTEMS:  Neurological: See HPI  Musculoskeletal: See HPI    PHYSICAL EXAM:  Constitutional: This is a well-developed, pleasant male in no acute distress.  BP 150/110 mmHg   Ht 6' 2.25" (1.886 m)   Wt 206 lb (93.441 kg)   BMI 26.27 kg/m2  Mental Status: Alert and oriented, with intact attention, concentration, memory, language and fund of knowledge.  Cranial Nerves:  Oculomotor intact. No facial weakness. No hearing loss. No dysarthria.  Motor: There is no  incoordination in the limbs. Gait is symmetrical. There is mild postural instability.  Sensory: There are no deficits for touch or pain. Romberg's test is positive.    Laboratory studies: See below\    Cranial MRI scan: Reviewed with the patient.  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  There appears to be involvement of the fourth ventricle as well.  On T2-weighted images there is prominence of white matter  changes periventricularly.    MRI scan of the lumbosacral spine-there is no enhancement of the nerve roots (as might be seen in an inflammatory polyneuropathy).  Degenerative changes on the left at S1 may explain the findings on electrophysiological testing.    ASSESSMENT/PLAN:     ICD-10-CM ICD-9-CM    1. 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  The appearance of the brain is suggestive of normal pressure hydrocephalus, except for the white matter changes, and the lack of typical complaints involving deterioration of gait, cognition, and sphincter dysfunction.    How the CNS findings might relate to the observation of polyneuropathy remains uncertain.  Further investigation warrant CSF analysis.  Extensive counseling provided today regarding indications for this type of investigation.    I will review the results of the MR imaging with neuroradiology.    Patient Instructions   You have evidence of peripheral neuropathy and abnormality of the central nervous system that explain your problems with balance.  The CAUSE remains unknown.  If inflammatory, you might  benefit with special therapies to reduce inflammation in the peripheral and/or central nervous system.    Please present at [facility] for lumbar puncture under fluoroscopy (spinal tap), for analysis of cerebral-spinal fluid.    Return for follow up after your test.        Prior EMR records reviewed as available and clinically relevant. All questions answered. Discharge and follow up instructions were discussed with the patient and/or accompanying persons.    LAB and IMAGING RESULTS:  Office Visit on 03/13/15   1. CRP, HIGH SENSITIVITY   Result Value Ref Range    CRP, High Sensitivity 2.8 0.0 - 10.0 mg/L   2. SED RATE   Result Value Ref Range    Erythrocyte Sedimentation Rate 8 0 - 15 mm/h   3. VITAMIN E (A-TOCOPHEROL)   Result Value Ref Range    Vitamin E (a_Tocopherol) 17.7 5 - 20 mg/L   4. HIV ANTIGEN AND ANTIBODY SCRN   Result Value Ref Range    HIV Antigen and Antibody Rslt Nonreactive NREAC    HIV Ag And Ab Result Interp       Nonreactive. No evidence of infection with HIV-1 or HIV-2.   5. ANTI NUCLEAR ANTIBODIES   Result Value Ref Range    Anti Nuclear Antibody Negative NRN    ANA Pattern None     ANA Screen Comment 2       IFA screen for anti nuclear antibodies which may not detect anti SSA/Ro or anti Jo1.         MRI BRAIN  04/11/15    IMPRESSION:  1. No acute intracranial abnormality. 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  2. Ventricular enlargement out of portion to sulcal prominence may reflect central predominant atrophy the pressure  hydrocephalus is not excluded.    Dictated and Verified by: Monia Pouch MD, [facility] 04/11/2015 15:41    MRI LUMBAR SPINE  04/11/15    IMPRESSION:  1. Mild canal and left subarticular zone narrowing at L3-L4, L4-L5 and L5-S1. No significant foraminal narrowing.    Dictated and Verified by: Monia Pouch MD, [facility] 04/11/2015 15:57      PROBLEM LIST:  Patient Active Problem List   Diagnosis    Syncope    Hypotension    Essential hypertension     Carotid Sinus Hypersensitivity    URI (upper respiratory infection)    Cervicalgia    Hyperlipidemia    Chronic pain    Bee sting allergy    Numbness and tingling of leg    Chronic back pain    B12 deficiency  Chronic renal insufficiency, stage III (moderate)    Essential tremor    Back pain, lumbosacral    Lumbar radiculopathy, chronic    Neck pain    Fall    Lumbosacral radiculopathy at S1    Tremor    Sensory neuropathy    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 myelopathy or radiculopathy    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 [facility])       PAST MEDICAL HISTORY:  Past Medical History   Diagnosis Date    Syncope     HYPOTENSION      HYPERTENSION      Carotid Sinus Hypersensitivity     URI (upper respiratory infection)     History of EKG 5/97    Lipidemia     Chronic kidney disease     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 myelopathy or radiculopathy 05/28/2014       SURGICAL HISTORY:  Past Surgical History   Procedure Laterality Date    Unlisted procedure hands/fingers      Unlisted procedure femur/knee      Anes; colonoscopy  2002     repeat in 3 years    Anes; colonoscopy & polypectomy  11/18/2007     repeat in 3 years    Unlisted procedure spine  2013     rfa to c3 to c 7        MEDICATIONS:  Current Outpatient Prescriptions   Medication Sig Dispense Refill    AmLODIPine Besylate 2.5 MG Oral Tab Take 1 tablet (2.5 mg) by mouth daily. 90 tablet 3    Atorvastatin Calcium 10 MG Oral Tab Take 1 tablet (10 mg) by mouth daily. 90 tablet 1    Cyanocobalamin 1000 MCG Oral Tab one per day over-the-counter started 5/21 /2012 this level borderline low 1 Tab 1    Diclofenac Sodium (VOLTAREN) 1 % Transdermal Gel Apply 4 g topically 4 times a day. Apply to neck, trapezius muscle, and low back. No more than 4 g applied at a time. 1 Tube 5    DULoxetine HCl 60 MG Oral CAPSULE ENTERIC COATED PARTICLES Take 1 capsule (60 mg) by mouth daily. 90 capsule  1    EPINEPHrine 0.3 MG/0.3ML Injection Solution Auto-injector Inject as instructed per patient package insert, 0.3 mg intramuscularly or subcutaneously into the thigh, if needed to treat anaphylaxis 2 Autoinjector 0    Hydrocodone-Acetaminophen 5-325 MG Oral Tab Take 1 tablet by mouth every 6 hours as needed. 60 tablet 0    Hydrocodone-Acetaminophen 5-325 MG Oral Tab Take 1 tablet by mouth every 6 hours as needed. 60 tablet 0    Hydrocodone-Acetaminophen 5-325 MG Oral Tab Take 1 tablet by mouth every 6 hours as needed. 60 tablet 0    Ibuprofen 200 MG Oral Tab Take 1 tablet (200 mg) by mouth. Give with food.mid day as needed for neck and back pain      Lidocaine 5 % External Patch Apply 1 patch onto the skin daily. Apply to painful area for up to 12 hours in a 24 hour period. 30 patch 12    Lisinopril 40 MG Oral Tab TAKE ONE TABLET BY MOUTH ONCE DAILY 90 tablet 1    Nadolol 20 MG Oral Tab TAKE ONE TABLET BY MOUTH ONCE DAILY AS NEEDED FOR TREMOR 30 tablet 2    Nadolol 40 MG Oral Tab Take 1 tablet (40 mg) by mouth daily. 180 tablet 3     No current  facility-administered medications for this visit.       ALLERGIES:  Review of patient's allergies indicates:  Allergies   Allergen Reactions    Bee Venom Hives, Itching and Swelling    Gabapentin      Flu like symptoms, diarrhea, body ache upset stomach    Lidocaine Rash     Lidocaine patch. PATIENT STATES HE IS ALLERGIC TO THE ADHESIVE NOT THE PATCH.       FAMILY HISTORY:  Family History   Problem Relation Age of Onset    Colon Cancer Father     Heart (other) Mother      MI    Other Family Hx       myelodysplasia       SOCIAL HISTORY:  Social History     Social History    Marital Status: Married     Spouse Name: N/A    Number of Children: N/A    Years of Education: N/A     Occupational History    Not on file.     Social History Main Topics    Smoking status: Never Smoker     Smokeless tobacco: Never Used    Alcohol Use: Yes    Drug Use: No    Sexual  Activity: Not on file     Other Topics Concern    Not on file     Social History Narrative         Total time of greater than 40 minutes was spent of which greater than 50% was spent in coordination of care and counseling with the patient, as outlined in this note.    Portions of today's documentation have been created with the assistance of voice recognition software. Therefore, it may contain anomalous punctuation, inadvertent misrecognitions, word substitutions, insertions or omissions. Occasional wrong-word or sound-alike substitutions may also occur, all due to the inherent limitations of voice recognition software. Attempts to correct the above have been made by Dr. Miguel Dibble, but it is recommended that the chart be read carefully to recognize, using context, where these substitutions may have occurred. Please notify us if a serious error or discrepancy is present so that it may be corrected or if there is confusion regarding this document    Portions of this chart were written by medical scribe, Loletha Grayer, with oversight by Dr. Liz Beach. Kirschner on 05/07/2015 9:20 AM

## 2015-05-07 NOTE — Progress Notes (Signed)
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

## 2015-05-07 NOTE — Patient Instructions (Signed)
You have evidence of peripheral neuropathy and abnormality of the central nervous system that explain your problems with balance.  The CAUSE remains unknown.  If inflammatory, you might benefit with special therapies to reduce inflammation in the peripheral and/or central nervous system.    Please present at [facility] for lumbar puncture under fluoroscopy (spinal tap), for analysis of cerebral-spinal fluid.    Return for follow up after your test.

## 2015-05-08 ENCOUNTER — Other Ambulatory Visit (INDEPENDENT_AMBULATORY_CARE_PROVIDER_SITE_OTHER): Payer: Self-pay | Admitting: Neurology

## 2015-05-08 DIAGNOSIS — R251 Tremor, unspecified: Secondary | ICD-10-CM

## 2015-05-08 NOTE — Telephone Encounter (Signed)
This was routed to [facility].  We do not do refill authorization for Neurology clinic.

## 2015-05-09 ENCOUNTER — Other Ambulatory Visit: Payer: Self-pay

## 2015-05-09 MED ORDER — NADOLOL 20 MG OR TABS
20.0000 mg | ORAL_TABLET | Freq: Every day | ORAL | Status: DC | PRN
Start: 2015-05-09 — End: 2015-08-27

## 2015-05-10 ENCOUNTER — Other Ambulatory Visit: Payer: Self-pay | Admitting: Adult Health

## 2015-05-13 ENCOUNTER — Encounter: Payer: Self-pay | Admitting: Neurology

## 2015-05-15 ENCOUNTER — Other Ambulatory Visit (HOSPITAL_BASED_OUTPATIENT_CLINIC_OR_DEPARTMENT_OTHER): Payer: Self-pay

## 2015-05-15 ENCOUNTER — Ambulatory Visit (HOSPITAL_BASED_OUTPATIENT_CLINIC_OR_DEPARTMENT_OTHER): Payer: PPO

## 2015-05-15 ENCOUNTER — Other Ambulatory Visit (INDEPENDENT_AMBULATORY_CARE_PROVIDER_SITE_OTHER): Payer: Self-pay | Admitting: Neurology

## 2015-05-15 ENCOUNTER — Other Ambulatory Visit
Admit: 2015-05-15 | Discharge: 2015-05-15 | Disposition: A | Discharge status: Home / Self Care | Payer: PPO | Attending: Neurology | Admitting: Neurology

## 2015-05-15 DIAGNOSIS — G969 Disorder of central nervous system, unspecified: Secondary | ICD-10-CM | POA: Insufficient documentation

## 2015-05-15 DIAGNOSIS — G379 Demyelinating disease of central nervous system, unspecified: Secondary | ICD-10-CM | POA: Insufficient documentation

## 2015-05-15 DIAGNOSIS — M5410 Radiculopathy, site unspecified: Secondary | ICD-10-CM | POA: Insufficient documentation

## 2015-05-15 LAB — CELL COUNT AND DIFF, CSF
% Basophils, Fluid: 0 %
% Basophils, Fluid: 0 %
% Eosinophils, Fluid: 0 %
% Eosinophils, Fluid: 0 %
% Lymphocytes, Fluid: 13 %
% Lymphocytes, Fluid: 67 %
% Macrophages, Fluid: 0 %
% Macrophages, Fluid: 19 %
% Neutrophils, Fluid: 33 %
% Neutrophils, Fluid: 68 %
% Unclassified Cells, Fluid: 0 %
% Unclassified Cells, Fluid: 0 %
CSF Nucleated Cells: 0 {cells}/uL (ref 0–5)
CSF Nucleated Cells: 2 {cells}/uL (ref 0–5)
CSF RBC: 1000 {cells}/uL — ABNORMAL HIGH (ref 0–5)
CSF RBC: 22 {cells}/uL — ABNORMAL HIGH (ref 0–5)
No. of Cells Counted for Diff: 16 {cells}
No. of Cells Counted for Diff: 3 {cells}

## 2015-05-15 LAB — GLUCOSE, CSF: Glucose, CSF: 73 mg/dL (ref 40–80)

## 2015-05-15 LAB — PROTEIN (TOTAL), CSF: Protein (Total), Csf: 61 mg/dL — ABNORMAL HIGH (ref 15–45)

## 2015-05-16 ENCOUNTER — Telehealth (INDEPENDENT_AMBULATORY_CARE_PROVIDER_SITE_OTHER): Payer: Self-pay | Admitting: Neurology

## 2015-05-16 DIAGNOSIS — G379 Demyelinating disease of central nervous system, unspecified: Secondary | ICD-10-CM

## 2015-05-16 LAB — LAB ADD ON ORDER

## 2015-05-16 LAB — SEROLOGIC SYPHILIS PANEL, SRM
Syphilis Igg Ab Result: NEGATIVE
Syphilis Screening Status: NEGATIVE

## 2015-05-16 LAB — IGG/ALBUMIN RATIOS, CSF/BLOOD
IgG/Albumin Ratio (Index), CSF: 0.48 (ref 0.34–0.66)
Immunoglobulin G, CSF: 3.8 mg/dL — ABNORMAL HIGH (ref 0.0–3.3)
Immunoglobulin G: 903 mg/dL (ref 610–1616)

## 2015-05-16 NOTE — Telephone Encounter (Signed)
Sent additional orders to lab for CSF microbiological studies after call from pathologist recommending evaluation for cryptococcus.    Also discussed lack of notification of results of cell count and chemistry on 05/15/15.

## 2015-05-17 LAB — CRYPTOCOCCAL ANTIGEN, CSF: Cryptococcal Antigen, CSF: NEGATIVE

## 2015-05-21 ENCOUNTER — Other Ambulatory Visit (INDEPENDENT_AMBULATORY_CARE_PROVIDER_SITE_OTHER): Payer: Self-pay | Admitting: Cardiovascular Disease

## 2015-05-21 DIAGNOSIS — I10 Essential (primary) hypertension: Secondary | ICD-10-CM

## 2015-05-21 DIAGNOSIS — R251 Tremor, unspecified: Secondary | ICD-10-CM

## 2015-05-21 LAB — OLIGOCLONAL BANDING, CSF

## 2015-05-22 LAB — TREPONEMA PALLIDUM AB,IFA,CSF: FTA Result, CSF (Sendout): NONREACTIVE

## 2015-05-23 LAB — ANGIOTENSIN CONVERTING ENZYME, CSF (SENDOUT): Angiotensin Converting Enzyme, CSF (Sendout): <0.4 U/L (ref 0.0–2.5)

## 2015-05-30 ENCOUNTER — Encounter (INDEPENDENT_AMBULATORY_CARE_PROVIDER_SITE_OTHER): Payer: PPO | Admitting: Internal Medicine

## 2015-06-05 ENCOUNTER — Ambulatory Visit (INDEPENDENT_AMBULATORY_CARE_PROVIDER_SITE_OTHER): Payer: PPO | Admitting: Internal Medicine

## 2015-06-05 ENCOUNTER — Ambulatory Visit: Payer: PPO | Attending: Internal Medicine

## 2015-06-05 VITALS — BP 128/80 | HR 60 | Ht 74.25 in

## 2015-06-05 DIAGNOSIS — M545 Low back pain, unspecified: Secondary | ICD-10-CM

## 2015-06-05 DIAGNOSIS — M542 Cervicalgia: Secondary | ICD-10-CM

## 2015-06-05 DIAGNOSIS — G894 Chronic pain syndrome: Secondary | ICD-10-CM | POA: Insufficient documentation

## 2015-06-05 DIAGNOSIS — Z23 Encounter for immunization: Secondary | ICD-10-CM

## 2015-06-05 DIAGNOSIS — G8929 Other chronic pain: Secondary | ICD-10-CM

## 2015-06-05 DIAGNOSIS — R42 Dizziness and giddiness: Secondary | ICD-10-CM

## 2015-06-05 MED ORDER — HYDROCODONE-ACETAMINOPHEN 5-325 MG OR TABS
1.0000 | ORAL_TABLET | Freq: Four times a day (QID) | ORAL | Status: DC | PRN
Start: 2015-06-05 — End: 2015-07-02

## 2015-06-05 NOTE — Progress Notes (Signed)
History  Ronnie Mason is a 64 year old male here for chronic pain    Has been followed by Dr. Marisa SprinklesKraft for his chronic neck and back pain.  See pain tracker form.    Having some dizziness  Not sure if med side effect  Reports a 10-20 point drop in blood pressure going from sitting to standing with Dr. Miguel DibbleKirschner  Has lightheadedness with standing up, but not every time he stands up     Past Medical History   Diagnosis Date   . Syncope    . HYPOTENSION     . HYPERTENSION     . Carotid Sinus Hypersensitivity    . URI (upper respiratory infection)    . History of EKG 5/97   . Lipidemia    . Chronic kidney disease    . 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 myelopathy or radiculopathy 05/28/2014     Patient Active Problem List   Diagnosis   . Syncope   . Hypotension   . Essential hypertension   . Carotid Sinus Hypersensitivity   . URI (upper respiratory infection)   . Cervicalgia   . Hyperlipidemia   . Chronic pain   . Bee sting allergy   . Numbness and tingling of leg   . Chronic back pain   . B12 deficiency   . Chronic renal insufficiency, stage III (moderate)   . Essential tremor   . Back pain, lumbosacral   . Lumbar radiculopathy, chronic   . Neck pain   . Fall   . Lumbosacral radiculopathy at S1   . Tremor   . Sensory neuropathy   . 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 myelopathy or radiculopathy   . 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 Never Smoker    Smokeless tobacco   . Never Used     Outpatient Prescriptions Prior to Visit   Medication Sig Dispense Refill   . AmLODIPine Besylate 2.5 MG Oral Tab Take 1 tablet (2.5 mg) by mouth daily. 90 tablet 3   . Atorvastatin Calcium 10 MG Oral Tab Take 1 tablet (10 mg) by mouth daily. 90 tablet 1   . Cyanocobalamin 1000 MCG Oral Tab one per day over-the-counter started 5/21 /2012 this level borderline low 1 Tab 1   . Diclofenac Sodium (VOLTAREN) 1 % Transdermal Gel Apply 4 g topically 4  times a day. Apply to neck, trapezius muscle, and low back. No more than 4 g applied at a time. 1 Tube 5   . DULoxetine HCl 60 MG Oral CAPSULE ENTERIC COATED PARTICLES Take 1 capsule (60 mg) by mouth daily. 90 capsule 1   . EPINEPHrine 0.3 MG/0.3ML Injection Solution Auto-injector Inject as instructed per patient package insert, 0.3 mg intramuscularly or subcutaneously into the thigh, if needed to treat anaphylaxis 2 Autoinjector 0   . Hydrocodone-Acetaminophen 5-325 MG Oral Tab Take 1 tablet by mouth every 6 hours as needed. 60 tablet 0   . Hydrocodone-Acetaminophen 5-325 MG Oral Tab Take 1 tablet by mouth every 6 hours as needed. 60 tablet 0   . Hydrocodone-Acetaminophen 5-325 MG Oral Tab Take 1 tablet by mouth every 6 hours as needed. 60 tablet 0   . Ibuprofen 200 MG Oral Tab Take 1 tablet (200 mg) by mouth. Give with food.mid day as needed for neck and back pain     . Lidocaine 5 % External Patch  Apply 1 patch onto the skin daily. Apply to painful area for up to 12 hours in a 24 hour period. 30 patch 12   . Lisinopril 40 MG Oral Tab TAKE ONE TABLET BY MOUTH ONCE DAILY 90 tablet 1   . Nadolol 20 MG Oral Tab Take 1 tablet (20 mg) by mouth daily as needed (tremor). 30 tablet 2   . Nadolol 40 MG Oral Tab Take 1 tablet (40 mg) by mouth daily. 180 tablet 3     No facility-administered medications prior to visit.     Allergies  Bee venom; Gabapentin; and Lidocaine    Physical Exam  BP 128/80 mmHg  Pulse 60  Ht 6' 2.25" (1.886 m)  SpO2 96%  General: alert  and no acute distress  Skin: skin color, texture, turgor normal  and no visible rashes  Head: normocephalic  Eyes: eyelids normal and sclera clear  Msk: gets on and off exam table easily  Neuro: grossly intact, normal gait  Psych: normal affect, interacts appropriately      Assessment and Plan  Delaine was seen today for follow up visit.    Diagnoses and all orders for this visit:    Chronic bilateral low back pain without sciatica  -     Hydrocodone-Acetaminophen  5-325 MG Oral Tab; Take 1 tablet by mouth every 6 hours as needed.    Chronic pain syndrome  -     Hydrocodone-Acetaminophen 5-325 MG Oral Tab; Take 1 tablet by mouth every 6 hours as needed.  -     CHRONIC PAIN DRUGS W/RFLX, URN    Cervicalgia  -     Hydrocodone-Acetaminophen 5-325 MG Oral Tab; Take 1 tablet by mouth every 6 hours as needed.    Lightheaded    Need for influenza vaccination  -     influenza vaccine quadrivalent PF (adult) 0.5 mL IM - [id number]      See pain tracker form  [place] PMP reviewed, no discrepancies  I note that he has not had drug screening in some time, so went ahead and ordered it   Refilled hydrocodone for 1 month.      Discussed that his lightheadedness is more likely due to side effect of nadolol.  Pathophysiology and management of this discussed.  Suggested that he may not want to increase his nadolol dose for his tremor for this reason.    Drink plenty of fluids - goal is at least 2 quarts (or liters) of non-caffeinated fluids daily.  I suspect the drop in blood pressure and consequent lightheadedness is due to nadolol.  Dehydration will exaggerate this side effect.    No Follow-up on file.  Follow-up in one month with Dr. Marisa Sprinkles    Greater than 50% of time spent on counseling or coordination of care, see above. Time spent: 20 minutes.

## 2015-06-05 NOTE — Patient Instructions (Addendum)
Drink plenty of fluids - goal is at least 2 quarts (or liters) of non-caffeinated fluids daily.  I suspect the drop in blood pressure and consequent lightheadedness is due to nadolol.  Dehydration will exaggerate this side effect.

## 2015-06-05 NOTE — Progress Notes (Signed)
Does patient have 3 or more complaints:  NO  If yes, discuss with patient the need to schedule a follow up appointment due to limited appointment time.  Follow up appointment scheduled:  NO  Care Everywhere records available/ reconciled:  NO  PHQ2 complete:  NO  PHQ9 complete:  NO  Refills pended:  NO  Orders pended:  NO  Medications to discontinue:  NO    Health Maintenance updated:  NO    Health Maintenance   Topic Date Due   . Colonoscopy  08/03/2013   . Influenza Vaccine (1) 04/04/2015   . Diabetes Screen  01/08/2018   . Cholesterol Test  05/12/2019   . Tetanus Vaccine  12/22/2022   . Zoster Vaccine  Completed   . Hepatitis C Screen  Addressed   . HIV Screen  Addressed       Spine tap per Dr. Harden MoKerners 10/12th , MRI 9/8 low back came back ok , [facility]     VACCINE SCREENING/ORDER WHEN CONTRAINDICATIONS PRESENT    Order date: 06/05/2015  [facility]  Clinic stock used: YES   Interpreter used?: None  Vaccine information sheet(s) discussed, patient/parent/guardian verbalized understanding? YES   Vaccines given: FLU   VIS given 06/05/2015 by Meryl Dareosemary  Salgado Chavez CMA      SCREENING: INACTIVATED VACCINES  NO 1. Do you have a high fever, severe cold-like symptoms or serious infection?  NO 2. Do you have any food allergies such as eggs, chicken, chicken feathers, chicken dander, or gelatin?  NO 3. Do you have any allergies to neomycin, streptomycin, or polymyxin?   NO 4. Have you had any severe reaction to a vaccine in the past such as a seizure, a convulsion from a high fever, or a paralysis problem called Guillain-Barre Syndrome? If so, which vaccine(s):   N/A QUESTIONS FOR WOMEN: Are you pregnant or planning on becoming pregnant in the next month?  Physician Order for Administration of Vaccine(s) When Contraindications Are Present  I have reviewed the existence in this patient's health history of possible contraindication(s) to administration of vaccine. The benefits to this patient outweigh the  potential risks of immunization. Administer vaccine(s):  Meryl DareSalgado Chavez, Rosemary, 06/05/2015 2:27 PM CMA

## 2015-06-06 LAB — CHRONIC PAIN DRUGS W/RFLX, URN
Alcohol (Ethyl), URN: NEGATIVE mg/dL
Amphet/Methamphetamine Qual,URN: NEGATIVE
Barbiturate (Qual), URN: NEGATIVE
Benzodiazepines (Qual), URN: NEGATIVE
Cocaine (Qual), URN: NEGATIVE
Methadone (Qual), URN: NEGATIVE
Opiates (Qual), URN: POSITIVE — AB
Oxycodone (Qual), URN: NEGATIVE
Phencyclidine (Qual), URN: NEGATIVE

## 2015-06-10 LAB — OPIOID CONFIRMATION, URINE
6-Monoacetyl Morphine Quant, URN: NEGATIVE ng/mL
Buprenorphine Quant, URN: NEGATIVE ng/mL
Codeine Quant, URN: NEGATIVE ng/mL
EDDP Quant, URN: NEGATIVE ng/mL
Fentanyl Quant, URN: NEGATIVE ng/mL
Hydrocodone Quant, URN: 6087 ng/mL — AB
Hydromorphone Gluc. Qual, URN: POSITIVE — AB
Hydromorphone Quant, URN: 27 ng/mL — AB
Meperidine Quant, URN: NEGATIVE ng/mL
Methadone Quant, URN: NEGATIVE ng/mL
Morphine Quant, URN: NEGATIVE ng/mL
Morphine_3_Glucuronide QL, URN: NEGATIVE
Morphine_6_Glucuronide QL, URN: NEGATIVE
Norbuprenorphine Gluc. QL,URN: NEGATIVE
Norfentanyl Quant, URN: NEGATIVE ng/mL
Norhydrocodone Quant, URN: 2599 ng/mL — AB
Normeperidine Quant, URN: NEGATIVE ng/mL
Oxycodone Quant, URN: NEGATIVE ng/mL
Oxymorphone Gluc. Qual, URN: NEGATIVE
Oxymorphone Quant, URN: NEGATIVE ng/mL
Propoxyphene Quant, URN: NEGATIVE ng/mL
Tramadol Qual, URN: NEGATIVE

## 2015-06-12 ENCOUNTER — Encounter (INDEPENDENT_AMBULATORY_CARE_PROVIDER_SITE_OTHER): Payer: PPO | Admitting: Neurology

## 2015-06-26 ENCOUNTER — Ambulatory Visit (INDEPENDENT_AMBULATORY_CARE_PROVIDER_SITE_OTHER): Payer: PPO | Admitting: Neurology

## 2015-06-26 VITALS — BP 140/100 | HR 71 | Wt 206.0 lb

## 2015-06-26 DIAGNOSIS — F1021 Alcohol dependence, in remission: Secondary | ICD-10-CM

## 2015-06-26 DIAGNOSIS — R251 Tremor, unspecified: Secondary | ICD-10-CM

## 2015-06-26 DIAGNOSIS — R55 Syncope and collapse: Secondary | ICD-10-CM

## 2015-06-26 DIAGNOSIS — G379 Demyelinating disease of central nervous system, unspecified: Secondary | ICD-10-CM

## 2015-06-26 DIAGNOSIS — G629 Polyneuropathy, unspecified: Secondary | ICD-10-CM

## 2015-06-26 NOTE — Progress Notes (Signed)
[facility]  Physician: Milus MallickMarc Kirschner, MD   Phone: [phone]  Today's date: 06/26/2015     HPI/CHIEF COMPLAINT:  Ronnie Mason is a 64 year old man who returns for a follow up of lower back pain, bilateral foot paraesthesia, and gait problems.    His tremor has improved by taking 40mg  Nadolol daily, however, he is concerned that it is lowering his blood pressure. Tremor worsens with alcohol consumption the evening prior.  He reports that currently he is abstinent but did an experiment with alcohol consumption in the evening to see if it worsened his tremor the following day.    He has had 3 episodes of dizziness associated with "flops." The first event occurred on 04/25/15 while waiting for his physical therapy appointment. He notified the receptionist that he wasn't feeling well and was escorted to his car when he suddenly felt faint and and was diaphoretic. His symptoms resolved when EMS arrived. The other 2 presyncopal episodes occurred while he was at home (9/24 and 11/17). His blood pressures at the time were 102/79 and 129/85 respectively. He is followed by Dr. Aundria RudWilkinson.     He takes 2 tablets Hydrocodone daily for lower back pain. He goes to physical therapy weekly and does exercises at home. He started a program called "[employer]" 2 weeks ago and says that is has significantly improved the numbness in his legs, right greater than left.     He exercises by walking his dog 2 times per day.      REVIEW OF SYSTEMS:  Neurological: See HPI  Musculoskeletal: See HPI      PHYSICAL EXAM:  Constitutional: This is a well-developed, pleasant male in no acute distress.  BP 140/100 mmHg   Pulse 71   Wt 206 lb (93.441 kg)  Mental Status: Alert and oriented, with intact attention,concentration, memory, language and fund of knowledge.  Cranial Nerves: Oculomotor intact. No facial weakness. No hearing loss. No dysarthria.  Motor: No abnormalities of tone, bulk, or power. There is no incoordination in the limbs.  Gait is symmetrical. There is no postural instability.      MRI brain (04/11/15) reviewed with patient: There is atrophy of the corpus callosum and enlargement of the lateral ventricles, as well as disseminated hyperintensity of uncertain significance but that may be compatible with inflammatory demyelination.  Some of the lesions appear perivenular and radiate from the periventricular space.  There are no juxtacortical lesions.    CSF results are unremarkable.  The lumbar puncture was traumatic that may account for a modest elevation in the CSF protein.  There were no oligoclonal bands.    Cytology of the CSF was somewhat surprising when reported to show "unusual objects" that might of been microorganisms (fungus in particular was suggested including cryptococcus).  Therefore a cryptococcal antigen and fungal culture was added on to the more standard investigations.  Results of this study were again reviewed with the pathologist-Dr. Cheng-has indicated that they could represent a contaminant.  The lack of abnormal cellularity in the spinal fluid argues strongly against there being infection.    CSF cryptococcal antigen titer: Negative.    ASSESSMENT/PLAN:     ICD-10-CM ICD-9-CM    1. 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 Syncope R55 780.2    4. History of alcohol dependence (HCC) F10.21 303.93    5. Tremor R25.1 781.0      Today we discussed at  some length the findings pertaining to central nervous system disease and their significance.  At this time, there is no evidence of active inflammation based on CSF analysis.  Furthermore, his symptoms are most likely related to his polyneuropathy is posterior central nervous system process.  Therefore, I do not believe that his problems pertain to the changes noted on the MRI scan.    That is not to say that he should not have further investigation.  I have recommended a follow-up MRI in March that should be performed  without and with contrast and with sagittal FLAIR images.    He will consider a trial of primidone after discontinuing the nadolol for management of tremor.    Patient Instructions   Reduce the nadolol dose to 20 mg per day for 1 week then discontinue.    Continue to abstain from alcohol.    Follow up with Dr. Aundria Rud for management of blood pressure.    Consider a trial of primidone for the management of your tremor.    Your spinal fluid was normal except for changes most likely related to some blood contamination of the sample--there is no evidence of active brain inflammation.    I recommend repeating your MRI scan in March to look for evidence of active denervation.  The changes on that test appear chronic--along with the spinal fluid results, there is no evidence of active inflammation affecting the central nervous system.    Follow up for your tremor management before the new year.    Prior EMR records reviewed as available and clinically relevant. All questions answered. Discharge and follow up instructions were discussed with the patient and/or accompanying persons.    LAB RESULTS:  Office Visit on 06/05/15   1. CHRONIC PAIN DRUGS W/RFLX, URN   Result Value Ref Range    Suspected Drugs, URN Information not provided     Alcohol (Ethyl), URN Negative NRN mg/dL    Amphet/Metamphetamine Qual,URN Negative NRN    Barbiturate (Qual), URN Negative NRN    Benzodiazepines (Qual), URN Negative NRN    Cocaine (Qual), URN Negative NRN    Methadone (Qual), URN Negative NRN    Opiates (Qual), URN Positive (A) NRN    Phencyclidine (Qual), URN Negative NRN    Oxycodone (Qual), URN Negative NRN    Drug Screen Test Info, URN       These results are presumptive and have not been confirmed as positive by an alternate method. Note that positive Amphet/Methamphetamine and Opiate results will be reflexively confirmed by LCMS at an   additional charge. See Amphetamines confirmation, URN and Opioid Confirmation, URN for results.      2. OPIOID CONFIRMATION, URINE   Result Value Ref Range    Opioid Confirm Interp, URN (NOTE)     Hydromorphone Quant, URN 27 (A) NRN ng/mL    Hydrocodone Quant, URN 6087 (A) NRN ng/mL    Norhydrocodone Quant, URN 2599 (A) NRN ng/mL    6-Monoacetyl Morphine Quant, URN Negative NRN ng/mL    Normeperidine Quant, URN Negative NRN ng/mL    Meperidine Quant, URN Negative NRN ng/mL    Methadone Quant, URN Negative NRN ng/mL    EDDP Quant, URN Negative NRN ng/mL    Oxymorphone Quant, URN Negative NRN ng/mL    Oxycodone Quant, URN Negative NRN ng/mL    Propoxyphene Quant, URN Negative NRN ng/mL    Buprenorphine Quant, URN Negative NRN ng/mL    Morphine Quant, URN Negative NRN ng/mL  Codeine Quant, URN Negative NRN ng/mL    Norfentanyl Quant, URN Negative NRN ng/mL    Fentanyl Quant, URN Negative NRN ng/mL    Morphine_3_Glucuronide QL, URN Negative NRN    Morphine_6_Glucuronide QL, URN Negative NRN    Hydromorphone Gluc. Qual, URN Positive (A) NRN    Oxymorphone Gluc. Marlane Mingle Negative NRN    Codeine_6_Glucuronide QL, URN Interfering Substance.  (A) NRN    Norbuprenorphine Gluc. QL,URN Negative NRN    Tramadol Qual, URN Negative NRN    Drug Screen Test Info, URN       Results for quantitative testing less than 10 ng/mL (except fentanyl 0.25 ng/mL and 6-monoacetyl morphine 5 ng/mL) and results for qualitative testing less than 50 ng/mL (except ethyl glucuronide 500   ng/mL) are reported as negative.         PROBLEM LIST:  Patient Active Problem List   Diagnosis    Syncope    Hypotension    Essential hypertension    Carotid Sinus Hypersensitivity    URI (upper respiratory infection)    Cervicalgia    Hyperlipidemia    Chronic pain    Bee sting allergy    Numbness and tingling of leg    Chronic back pain    B12 deficiency    Chronic renal insufficiency, stage III (moderate)    Essential tremor    Back pain, lumbosacral    Lumbar radiculopathy, chronic    Neck pain    Fall    Lumbosacral radiculopathy at S1     Tremor    Sensory neuropathy    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 myelopathy or radiculopathy    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 [facility])    History of alcohol dependence (HCC)       PAST MEDICAL HISTORY:  Past Medical History   Diagnosis Date    Syncope     HYPOTENSION      HYPERTENSION      Carotid Sinus Hypersensitivity     URI (upper respiratory infection)     History of EKG 5/97    Lipidemia     Chronic kidney disease     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 myelopathy or radiculopathy 05/28/2014       SURGICAL HISTORY:  Past Surgical History   Procedure Laterality Date    Unlisted procedure hands/fingers      Unlisted procedure femur/knee      Anes; colonoscopy  2002     repeat in 3 years    Anes; colonoscopy & polypectomy  11/18/2007     repeat in 3 years    Unlisted procedure spine  2013     rfa to c3 to c 7        MEDICATIONS:  Current Outpatient Prescriptions   Medication Sig Dispense Refill    AmLODIPine Besylate 2.5 MG Oral Tab Take 1 tablet (2.5 mg) by mouth daily. 90 tablet 3    Atorvastatin Calcium 10 MG Oral Tab Take 1 tablet (10 mg) by mouth daily. 90 tablet 1    Cyanocobalamin 1000 MCG Oral Tab one per day over-the-counter started 5/21 /2012 this level borderline low 1 Tab 1    Diclofenac Sodium (VOLTAREN) 1 % Transdermal Gel Apply 4 g topically 4 times a day. Apply to neck, trapezius muscle, and low back. No more than 4 g applied at a time. 1 Tube 5    DULoxetine HCl 60 MG Oral CAPSULE ENTERIC COATED PARTICLES Take  1 capsule (60 mg) by mouth daily. 90 capsule 1    EPINEPHrine 0.3 MG/0.3ML Injection Solution Auto-injector Inject as instructed per patient package insert, 0.3 mg intramuscularly or subcutaneously into the thigh, if needed to treat anaphylaxis 2 Autoinjector 0    Hydrocodone-Acetaminophen 5-325 MG Oral Tab Take 1 tablet by mouth every 6 hours as needed. 60 tablet 0    Hydrocodone-Acetaminophen 5-325 MG Oral  Tab Take 1 tablet by mouth every 6 hours as needed. 60 tablet 0    Hydrocodone-Acetaminophen 5-325 MG Oral Tab Take 1 tablet by mouth every 6 hours as needed. 60 tablet 0    Ibuprofen 200 MG Oral Tab Take 1 tablet (200 mg) by mouth. Give with food.mid day as needed for neck and back pain      Lidocaine 5 % External Patch Apply 1 patch onto the skin daily. Apply to painful area for up to 12 hours in a 24 hour period. 30 patch 12    Lisinopril 40 MG Oral Tab TAKE ONE TABLET BY MOUTH ONCE DAILY 90 tablet 1    Nadolol 20 MG Oral Tab Take 1 tablet (20 mg) by mouth daily as needed (tremor). 30 tablet 2    Nadolol 40 MG Oral Tab Take 1 tablet (40 mg) by mouth daily. 180 tablet 3     No current facility-administered medications for this visit.       ALLERGIES:  Review of patient's allergies indicates:  Allergies   Allergen Reactions    Bee Venom Hives, Itching and Swelling    Gabapentin      Flu like symptoms, diarrhea, body ache upset stomach    Lidocaine Rash     Lidocaine patch. PATIENT STATES HE IS ALLERGIC TO THE ADHESIVE NOT THE PATCH.       FAMILY HISTORY:  Family History   Problem Relation Age of Onset    Colon Cancer Father     Heart (other) Mother      MI    Other Family Hx       myelodysplasia       SOCIAL HISTORY:  Social History     Social History    Marital Status: Married     Spouse Name: N/A    Number of Children: N/A    Years of Education: N/A     Occupational History    Not on file.     Social History Main Topics    Smoking status: Never Smoker     Smokeless tobacco: Never Used    Alcohol Use: Yes    Drug Use: No    Sexual Activity: Not on file     Other Topics Concern    Not on file     Social History Narrative         Total time of greater than 45 minutes was spent of which greater than 50% was spent in coordination of care and counseling with the patient, as outlined in this note.    Portions of today's documentation have been created with the assistance of voice recognition  software. Therefore, it may contain anomalous punctuation, inadvertent misrecognitions, word substitutions, insertions or omissions. Occasional wrong-word or sound-alike substitutions may also occur, all due to the inherent limitations of voice recognition software. Attempts to correct the above have been made by Dr. Miguel Dibble, but it is recommended that the chart be read carefully to recognize, using context, where these substitutions may have occurred. Please notify us if a serious error or discrepancy is  present so that it may be corrected or if there is confusion regarding this document.    Portions of this chart were written by medical scribe, Loletha Grayer, with oversight by Dr. Liz Beach. Kirschner on 06/26/2015 11:10 AM

## 2015-06-26 NOTE — Patient Instructions (Signed)
Reduce the nadolol dose to 20 mg per day for 1 week then discontinue.    Continue to abstain from alcohol.    Follow up with Dr. Aundria RudWilkinson for management of blood pressure.    Consider a trial of primidone for the management of your tremor.    Your spinal fluid was normal except for changes most likely related to some blood contamination of the sample--there is no evidence of active brain inflammation.    I recommend repeating your MRI scan in March to look for evidence of active denervation.  The changes on that test appear chronic--along with the spinal fluid results, there is no evidence of active inflammation affecting the central nervous system.    Follow up for your tremor management before the new year.

## 2015-06-26 NOTE — Progress Notes (Signed)
The patient presents today for 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    Last visit: 05/07/2015  Vital complete:  YES  Care Everywhere records available/ reconciled:  YES  PHQ2 complete:  YES  Pharmacy complete: YES   Refills pended:  NO  Medications reviewed:  YES    Medical, surgical, family, and social updated:  YES  Health Maintenance updated:  YES  Recent imaging or hospital visit?:  NO (if yes=pull up images/documents)  Physician added to patient's care team:  YES

## 2015-07-02 ENCOUNTER — Ambulatory Visit (INDEPENDENT_AMBULATORY_CARE_PROVIDER_SITE_OTHER): Payer: PPO | Admitting: Internal Medicine

## 2015-07-02 ENCOUNTER — Encounter (INDEPENDENT_AMBULATORY_CARE_PROVIDER_SITE_OTHER): Payer: Self-pay | Admitting: Internal Medicine

## 2015-07-02 VITALS — BP 130/82 | HR 72 | Ht 74.25 in | Wt 209.0 lb

## 2015-07-02 DIAGNOSIS — M542 Cervicalgia: Secondary | ICD-10-CM

## 2015-07-02 DIAGNOSIS — E785 Hyperlipidemia, unspecified: Secondary | ICD-10-CM

## 2015-07-02 DIAGNOSIS — G379 Demyelinating disease of central nervous system, unspecified: Secondary | ICD-10-CM

## 2015-07-02 DIAGNOSIS — G894 Chronic pain syndrome: Secondary | ICD-10-CM

## 2015-07-02 DIAGNOSIS — G8929 Other chronic pain: Secondary | ICD-10-CM

## 2015-07-02 DIAGNOSIS — M545 Low back pain, unspecified: Secondary | ICD-10-CM

## 2015-07-02 DIAGNOSIS — Z131 Encounter for screening for diabetes mellitus: Secondary | ICD-10-CM

## 2015-07-02 MED ORDER — ATORVASTATIN CALCIUM 10 MG OR TABS
10.0000 mg | ORAL_TABLET | Freq: Every day | ORAL | Status: DC
Start: 2015-07-02 — End: 2016-02-17

## 2015-07-02 MED ORDER — HYDROCODONE-ACETAMINOPHEN 5-325 MG OR TABS
ORAL_TABLET | ORAL | Status: DC
Start: 2015-08-01 — End: 2015-08-27

## 2015-07-02 MED ORDER — HYDROCODONE-ACETAMINOPHEN 5-325 MG OR TABS
ORAL_TABLET | ORAL | Status: DC
Start: 2015-08-31 — End: 2015-08-27

## 2015-07-02 MED ORDER — HYDROCODONE-ACETAMINOPHEN 5-325 MG OR TABS
ORAL_TABLET | ORAL | Status: DC
Start: 2015-07-02 — End: 2015-07-02

## 2015-07-02 MED ORDER — DULOXETINE HCL 60 MG OR CPEP
60.0000 mg | DELAYED_RELEASE_CAPSULE | Freq: Every day | ORAL | Status: DC
Start: 2015-07-02 — End: 2016-02-17

## 2015-07-02 MED ORDER — HYDROCODONE-ACETAMINOPHEN 5-325 MG OR TABS
ORAL_TABLET | ORAL | Status: DC
Start: 2015-07-02 — End: 2015-10-01

## 2015-07-02 NOTE — Progress Notes (Signed)
Does patient have 3 or more complaints:  NO  If yes, discuss with patient the need to schedule a follow up appointment due to limited appointment time.  Follow up appointment scheduled:  YES  Care Everywhere records available/ reconciled:  YES  PHQ2 complete:  NO  PHQ9 complete:  YES  Refills pended:  YES  Orders pended:  NO  Medications to discontinue:  YES    Health Maintenance updated:  YES    Health Maintenance   Topic Date Due   . Colonoscopy  08/03/2013   . Diabetes Screen  01/08/2018   . Cholesterol Test  05/12/2019   . Tetanus Vaccine  12/22/2022   . Zoster Vaccine  Completed   . Influenza Vaccine  Completed   . Hepatitis C Screen  Addressed   . HIV Screen  Addressed

## 2015-07-02 NOTE — Patient Instructions (Signed)
Office Visit on 06/05/15   1. CHRONIC PAIN DRUGS W/RFLX, URN   Result Value Ref Range    Suspected Drugs, URN Information not provided     Alcohol (Ethyl), URN Negative NRN mg/dL    Amphet/Metamphetamine Qual,URN Negative NRN    Barbiturate (Qual), URN Negative NRN    Benzodiazepines (Qual), URN Negative NRN    Cocaine (Qual), URN Negative NRN    Methadone (Qual), URN Negative NRN    Opiates (Qual), URN Positive (A) NRN    Phencyclidine (Qual), URN Negative NRN    Oxycodone (Qual), URN Negative NRN    Drug Screen Test Info, URN       These results are presumptive and have not been confirmed as positive by an alternate method. Note that positive Amphet/Methamphetamine and Opiate results will be reflexively confirmed by LCMS at an   additional charge. See Amphetamines confirmation, URN and Opioid Confirmation, URN for results.     2. OPIOID CONFIRMATION, URINE   Result Value Ref Range    Opioid Confirm Interp, URN (NOTE)     Hydromorphone Quant, URN 27 (A) NRN ng/mL    Hydrocodone Quant, URN 6087 (A) NRN ng/mL    Norhydrocodone Quant, URN 2599 (A) NRN ng/mL    6-Monoacetyl Morphine Quant, URN Negative NRN ng/mL    Normeperidine Quant, URN Negative NRN ng/mL    Meperidine Quant, URN Negative NRN ng/mL    Methadone Quant, URN Negative NRN ng/mL    EDDP Quant, URN Negative NRN ng/mL    Oxymorphone Quant, URN Negative NRN ng/mL    Oxycodone Quant, URN Negative NRN ng/mL    Propoxyphene Quant, URN Negative NRN ng/mL    Buprenorphine Quant, URN Negative NRN ng/mL    Morphine Quant, URN Negative NRN ng/mL    Codeine Quant, URN Negative NRN ng/mL    Norfentanyl Quant, URN Negative NRN ng/mL    Fentanyl Quant, URN Negative NRN ng/mL    Morphine_3_Glucuronide QL, URN Negative NRN    Morphine_6_Glucuronide QL, URN Negative NRN    Hydromorphone Gluc. Qual, URN Positive (A) NRN    Oxymorphone Gluc. Marlane MingleQual, URN Negative NRN    Codeine_6_Glucuronide QL, URN Interfering Substance.  (A) NRN    Norbuprenorphine Gluc. QL,URN Negative NRN     Tramadol Qual, URN Negative NRN    Drug Screen Test Info, URN       Results for quantitative testing less than 10 ng/mL (except fentanyl 0.25 ng/mL and 6-monoacetyl morphine 5 ng/mL) and results for qualitative testing less than 50 ng/mL (except ethyl glucuronide 500   ng/mL) are reported as negative.

## 2015-07-02 NOTE — Progress Notes (Signed)
Ronnie Mason is a 64 year old male     Review of patient's allergies indicates:  Allergies   Allergen Reactions   . Bee Venom Hives, Itching and Swelling   . Gabapentin      Flu like symptoms, diarrhea, body ache upset stomach   . Lidocaine Rash     Lidocaine patch. PATIENT STATES HE IS ALLERGIC TO THE ADHESIVE NOT THE PATCH.       BP 130/82 mmHg  Pulse 72  Ht 6' 2.25" (1.886 m)  Wt 209 lb (94.802 kg)  BMI 26.65 kg/m2    Chief Complaint   Patient presents with   . Follow Up Visit       HPI  History of:    Hydrocephalus seen 04/11/2015 working with Dr. Miguel Dibble to find etiology thought to have a traumatic lumbar puncture and so far negative Cryptococcus   History of chronic back pain chronic neck pain and degenerative disc disease   Chronic numbness and tingling in his feet    Today follow-up above issues:     He was noting a lot of dizziness on the higher dose of nadolol and is now down to 20 mg in the last past week he has not had any symptoms   he also was noting that he had a little bit more tremor the morning after drinking alcohol-and he never thought himself is drinking excessively and he had never drink during the week but would occasionally drink a little bit up to 3 glasses on the weekend    because of the tremor question associated with alcohol he stopped alcohol or barely is drinking at this point and he does notice an improvement in his tremor        Patient has a history of also seeing Dr. Aundria Rud low blood pressure was noted history of hypotensive syncope -secondary to medications noted on visit 02/23/2015   The patient was initiated at that visit on amlodipine 2.5 mg per day at that visit that he has not taken this medication for the last 3 months because he felt it made him have very low blood pressure      currently patient has no urinary incontinence no headaches his balance is not very good         Office Visit on 06/05/15   1. CHRONIC PAIN DRUGS W/RFLX, URN   Result Value Ref  Range    Suspected Drugs, URN Information not provided     Alcohol (Ethyl), URN Negative NRN mg/dL    Amphet/Metamphetamine Qual,URN Negative NRN    Barbiturate (Qual), URN Negative NRN    Benzodiazepines (Qual), URN Negative NRN    Cocaine (Qual), URN Negative NRN    Methadone (Qual), URN Negative NRN    Opiates (Qual), URN Positive (A) NRN    Phencyclidine (Qual), URN Negative NRN    Oxycodone (Qual), URN Negative NRN    Drug Screen Test Info, URN       These results are presumptive and have not been confirmed as positive by an alternate method. Note that positive Amphet/Methamphetamine and Opiate results will be reflexively confirmed by LCMS at an   additional charge. See Amphetamines confirmation, URN and Opioid Confirmation, URN for results.     2. OPIOID CONFIRMATION, URINE   Result Value Ref Range    Opioid Confirm Interp, URN (NOTE)     Hydromorphone Quant, URN 27 (A) NRN ng/mL    Hydrocodone Quant, URN 6087 (A) NRN ng/mL  Norhydrocodone Quant, URN 2599 (A) NRN ng/mL    6-Monoacetyl Morphine Quant, URN Negative NRN ng/mL    Normeperidine Quant, URN Negative NRN ng/mL    Meperidine Quant, URN Negative NRN ng/mL    Methadone Quant, URN Negative NRN ng/mL    EDDP Quant, URN Negative NRN ng/mL    Oxymorphone Quant, URN Negative NRN ng/mL    Oxycodone Quant, URN Negative NRN ng/mL    Propoxyphene Quant, URN Negative NRN ng/mL    Buprenorphine Quant, URN Negative NRN ng/mL    Morphine Quant, URN Negative NRN ng/mL    Codeine Quant, URN Negative NRN ng/mL    Norfentanyl Quant, URN Negative NRN ng/mL    Fentanyl Quant, URN Negative NRN ng/mL    Morphine_3_Glucuronide QL, URN Negative NRN    Morphine_6_Glucuronide QL, URN Negative NRN    Hydromorphone Gluc. Qual, URN Positive (A) NRN    Oxymorphone Gluc. Marlane Mingle Negative NRN    Codeine_6_Glucuronide QL, URN Interfering Substance.  (A) NRN    Norbuprenorphine Gluc. QL,URN Negative NRN    Tramadol Qual, URN Negative NRN    Drug Screen Test Info, URN       Results  for quantitative testing less than 10 ng/mL (except fentanyl 0.25 ng/mL and 6-monoacetyl morphine 5 ng/mL) and results for qualitative testing less than 50 ng/mL (except ethyl glucuronide 500   ng/mL) are reported as negative.           MEDICATION PRIOR TO VISIT  Reports   Outpatient Prescriptions Prior to Visit   Medication Sig Dispense Refill   . AmLODIPine Besylate 2.5 MG Oral Tab Take 1 tablet (2.5 mg) by mouth daily. 90 tablet 3   . Atorvastatin Calcium 10 MG Oral Tab Take 1 tablet (10 mg) by mouth daily. 90 tablet 1   . Cyanocobalamin 1000 MCG Oral Tab one per day over-the-counter started 5/21 /2012 this level borderline low 1 Tab 1   . Diclofenac Sodium (VOLTAREN) 1 % Transdermal Gel Apply 4 g topically 4 times a day. Apply to neck, trapezius muscle, and low back. No more than 4 g applied at a time. 1 Tube 5   . DULoxetine HCl 60 MG Oral CAPSULE ENTERIC COATED PARTICLES Take 1 capsule (60 mg) by mouth daily. 90 capsule 1   . EPINEPHrine 0.3 MG/0.3ML Injection Solution Auto-injector Inject as instructed per patient package insert, 0.3 mg intramuscularly or subcutaneously into the thigh, if needed to treat anaphylaxis 2 Autoinjector 0   . Hydrocodone-Acetaminophen 5-325 MG Oral Tab Take 1 tablet by mouth every 6 hours as needed. 60 tablet 0   . Hydrocodone-Acetaminophen 5-325 MG Oral Tab Take 1 tablet by mouth every 6 hours as needed. 60 tablet 0   . Hydrocodone-Acetaminophen 5-325 MG Oral Tab Take 1 tablet by mouth every 6 hours as needed. 60 tablet 0   . Ibuprofen 200 MG Oral Tab Take 1 tablet (200 mg) by mouth. Give with food.mid day as needed for neck and back pain     . Lidocaine 5 % External Patch Apply 1 patch onto the skin daily. Apply to painful area for up to 12 hours in a 24 hour period. 30 patch 12   . Lisinopril 40 MG Oral Tab TAKE ONE TABLET BY MOUTH ONCE DAILY 90 tablet 1   . Nadolol 20 MG Oral Tab Take 1 tablet (20 mg) by mouth daily as needed (tremor). 30 tablet 2   . Nadolol 40 MG Oral Tab  Take 1 tablet (40 mg)  by mouth daily. (Patient taking differently: Take 40 mg by mouth daily. Patient will be going off medication at the end of the week per Dr. Miguel DibbleKirschner) 180 tablet 3     No facility-administered medications prior to visit.         REVIEW OF SYSTEMS - are otherwise negative unless as noted in HPI above or as additional issues below    Constitutional:     Eyes:    Head ears, nose mouth throat:     Cardiovascular:   Respiratory:     Gastrointestinal:   Genitourinary:   no urinary incontinence     Musculoskeletal:    Integumentary:     Neurological:  no headaches   Psychiatric/Behavioral:       Endocrine:     Hematological:   Allergic/Immunologic:        PHYSICAL EXAM  BP 130/82 mmHg  Pulse 72  Ht 6' 2.25" (1.886 m)  Wt 209 lb (94.802 kg)  BMI 26.65 kg/m2    Well Dressed    Constitution:  In general looks:   well , good coloring  No diaphoresis    Patient is Alert  & Attentive  Patient is conversive    Psychiatric Mental Status / Neurologic Mental Status:  Affect:  Pleasant   Calm  Mood:  Good  Awake: Alert and attentive  Not confused    Thought::     content : no gross issue   Process : logical  Language:   Speech is fluent  Intact comprehension    Eyes: no discharge, no gross puffiness, non- icteric        Neck:    Thyroid not enlarged  2+ carotid pulses no bruits    Cardiovascular:   Heart Regular rate , rhythm, no murmur    No edema    Lymph nodes:  No gross cervical swelling  No gross axillary swelling    Respiratory:   Not tachyphpnic  Clear to ascultation  No rales, rhonchi , without increased effort or stridor  No clubbing       Musculoskeletal:  No gross abnormal movement, twitch   No gross atrophy or deformity    Skin:  No gross lesion or rash    Neurologic:   Grossly intact  Memory:   Attention       Cranial Nerve    Eyes pupil and lid are intact   II, III, IV, VIII   Facial Movement VII: grossly intact, no facial droop     Motor- Gait-coordination: get up and go normal, no gross  weakness   Gait- base normal appropriate for age   Get up and go -no problem      slight left hand tremor no hand atrophy           Hematologic/Lymphatic /Immunologic:  No gross bruising or rash or hives       IMPRESSION / PLAN / DISCUSSION     Ronnie Mason was seen today for follow up visit.    Diagnoses and all orders for this visit:    Chronic bilateral low back pain without sciatica  -     Hydrocodone-Acetaminophen 5-325 MG Oral Tab; 1-2 po in am as needed  -     Discontinue: Hydrocodone-Acetaminophen 5-325 MG Oral Tab; 1-2 po in am as needed  -     DULoxetine HCl 60 MG Oral CAPSULE ENTERIC COATED PARTICLES; Take 1 capsule (60 mg) by mouth daily.  -     Hydrocodone-Acetaminophen 5-325  MG Oral Tab; 1-2 po in am as needed    Chronic pain syndrome  -     Hydrocodone-Acetaminophen 5-325 MG Oral Tab; 1-2 po in am as needed  -     Discontinue: Hydrocodone-Acetaminophen 5-325 MG Oral Tab; 1-2 po in am as needed  -     Hydrocodone-Acetaminophen 5-325 MG Oral Tab; 1-2 po in am as needed  -     DULoxetine HCl 60 MG Oral CAPSULE ENTERIC COATED PARTICLES; Take 1 capsule (60 mg) by mouth daily.  -     Hydrocodone-Acetaminophen 5-325 MG Oral Tab; 1-2 po in am as needed    Cervicalgia  -     Hydrocodone-Acetaminophen 5-325 MG Oral Tab; 1-2 po in am as needed  -     Discontinue: Hydrocodone-Acetaminophen 5-325 MG Oral Tab; 1-2 po in am as needed  -     Hydrocodone-Acetaminophen 5-325 MG Oral Tab; 1-2 po in am as needed  -     DULoxetine HCl 60 MG Oral CAPSULE ENTERIC COATED PARTICLES; Take 1 capsule (60 mg) by mouth daily.  -     Hydrocodone-Acetaminophen 5-325 MG Oral Tab; 1-2 po in am as needed    Hyperlipidemia, unspecified hyperlipidemia type  -     Atorvastatin Calcium 10 MG Oral Tab; Take 1 tablet (10 mg) by mouth daily.  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)    Screening for diabetes mellitus  -     HEMOGLOBIN A1C, HPLC      Patient is here to review and refill chronic  pain medication    [place] prescription monitoring program reviewed   Gad 7 / PhQ 9 reviewed  Pain tracker reviewed   Reviewed pain contract     Patient denies diversion, has not had drug escalation    Urine drug testing for safety on medication last visit at our clinic     Medication enable patient to : get up in am          neck pain is controlled he actually takes 2 pain pills in the morning only and this is because his back seizes up in the morning    cause for his demyelination on MRI scan is still being worked up with Dr. Miguel Dibble       The cause for the multiple episodes of falling down is concerning and it's not exactly clear why although he feels that he is less dizzy on less nadolol      He will follow-up with Dr. Aundria Rud    He never felt that he drink too much but alcohol does make the tremor worse and he will stop that medication plus he is on narcotic pain medication at this point    Check labs due for his medication antihypertensive and cholesterol medication he will continue these medications    B12 was corrected in June and he continues to take his medication  He has never really taken the amlodipine after it was initiated      MEDICATION as of Discharge  Current Outpatient Prescriptions   Medication Sig Dispense Refill   . Atorvastatin Calcium 10 MG Oral Tab Take 1 tablet (10 mg) by mouth daily. 90 tablet 1   . Cyanocobalamin 1000 MCG Oral Tab one per day over-the-counter started 5/21 /2012 this level borderline low 1 Tab 1   . Diclofenac Sodium (VOLTAREN) 1 % Transdermal Gel Apply  4 g topically 4 times a day. Apply to neck, trapezius muscle, and low back. No more than 4 g applied at a time. 1 Tube 5   . DULoxetine HCl 60 MG Oral CAPSULE ENTERIC COATED PARTICLES Take 1 capsule (60 mg) by mouth daily. 90 capsule 1   . EPINEPHrine 0.3 MG/0.3ML Injection Solution Auto-injector Inject as instructed per patient package insert, 0.3 mg intramuscularly or subcutaneously into the thigh, if needed to  treat anaphylaxis 2 Autoinjector 0   . Hydrocodone-Acetaminophen 5-325 MG Oral Tab 1-2 po in am as needed 60 tablet 0   . [START ON 08/01/2015] Hydrocodone-Acetaminophen 5-325 MG Oral Tab 1-2 po in am as needed 60 tablet 0   . [START ON 08/31/2015] Hydrocodone-Acetaminophen 5-325 MG Oral Tab 1-2 po in am as needed 60 tablet 0   . Ibuprofen 200 MG Oral Tab Take 1 tablet (200 mg) by mouth. Give with food.mid day as needed for neck and back pain     . Lidocaine 5 % External Patch Apply 1 patch onto the skin daily. Apply to painful area for up to 12 hours in a 24 hour period. 30 patch 12   . Lisinopril 40 MG Oral Tab TAKE ONE TABLET BY MOUTH ONCE DAILY 90 tablet 1   . Nadolol 20 MG Oral Tab Take 1 tablet (20 mg) by mouth daily as needed (tremor). 30 tablet 2   . Nadolol 40 MG Oral Tab Take 1 tablet (40 mg) by mouth daily. (Patient taking differently: Take 40 mg by mouth daily. Patient will be going off medication at the end of the week per Dr. Miguel Dibble) 180 tablet 3     No current facility-administered medications for this visit.         Risk of taking medication properly and follow up discussed especially to call if problem    Patient   Family Member -   express understanding of care plan/medications    I spent a total time of 25 minutes face-to-face with the patient, of which more than 50% was spent counseling and coordinating care as outlined in this note.    No Follow-up on file.     Cc to following MD:                ++++++++++++++++++++++++++++++++++++++++++++++++++        Patient Active Problem List    Diagnosis Date Noted   . History of alcohol dependence (HCC) [F10.21] 06/26/2015   . 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 Tremor [R25.1] 04/20/2013   . Lumbosacral radiculopathy at S1  [M54.17] 03/22/2013     Left     . Fall [W19.XXXA] 02/23/2013     BP < 100  Occasioanal tri[p and leg weakness     . Essential tremor [G25.0] 02/15/2013   . Back pain, lumbosacral [M54.5, M54.89] 02/15/2013   . Lumbar radiculopathy, chronic [M54.16] 02/15/2013   . Neck pain [M54.2] 02/15/2013   . Chronic pain [G89.29] 12/21/2012     Now at [facility], DO, Santiago Glad    Neck pain -burn c3-7 on left side per patient  Some trigger injection    Lumbar pain-since 80's better with exercise     . 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 Numbness  and tingling of leg [R20.2] 12/21/2012     Left calf -left foot long term suspect from back-suspect L 45     . Chronic back pain [M54.9, G89.29] 12/21/2012     Mri in mid scape 1/214  Multilevel degenerative disc disease of the lumbar spine. Circumferential disc bulge at all levels below and including L1-2. No significant central spinal canal stenosis or neuroforaminal narrowing.  2. Mild retrolisthesis of L5 on S1.           . B12 deficiency [E53.8] 12/21/2012     Borderline low 5 /21/2014     . Chronic renal insufficiency, stage III (moderate) [N18.3] 12/21/2012     5/ 2013     . Hyperlipidemia [E78.5] 09/26/2012   . Cervicalgia [M54.2] 03/11/2010     Radiofrequency ablation of c3 to c 7     . Syncope [R55]    . Hypotension [I95.9]    . Essential hypertension [I10]    . 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 URI (upper respiratory infection) [J06.9]          Past Medical History   Diagnosis Date   . Syncope    . HYPOTENSION     . HYPERTENSION     . Carotid Sinus Hypersensitivity    . URI (upper respiratory infection)    . History of EKG 5/97   . Lipidemia    . Chronic kidney disease    . 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 myelopathy or radiculopathy 05/28/2014       Social History     Social History   . Marital Status: Married     Spouse Name: N/A   . Number of Children: N/A   . Years of Education: N/A     Occupational History   . Not on file.      Social History Main Topics   . Smoking status: Never Smoker    . Smokeless tobacco: Never Used   . Alcohol Use: Yes   . Drug Use: No   . Sexual Activity: Not on file     Other Topics Concern   . Not on file     Social History Narrative         Family History   Problem Relation Age of Onset   . Colon Cancer Father    . Heart (other) Mother      MI   . Other Family Hx       myelodysplasia       Social History     Social History   . Marital Status: Married     Spouse Name: N/A   . Number of Children: N/A   . Years of Education: N/A     Social History Main Topics   . Smoking status: Never Smoker    . Smokeless tobacco: Never Used   . Alcohol Use: Yes   . Drug Use: No   . Sexual Activity: Not Asked     Other Topics Concern   . None     Social History Narrative       Health Maintenance   Topic Date Due   . Colonoscopy  08/03/2013   . Diabetes Screen  01/08/2018   . Cholesterol Test  05/12/2019   . Tetanus Vaccine  12/22/2022   . Zoster Vaccine  Completed   . Influenza Vaccine  Completed   . Hepatitis C Screen  Addressed   . HIV Screen  Addressed  I'll obtain a

## 2015-07-14 ENCOUNTER — Emergency Department
Admission: EM | Admit: 2015-07-14 | Discharge: 2015-07-14 | Disposition: A | Discharge status: Home / Self Care | Payer: PPO | Attending: Emergency Medicine | Admitting: Emergency Medicine

## 2015-07-14 ENCOUNTER — Encounter (HOSPITAL_COMMUNITY): Payer: Self-pay | Admitting: Emergency Medicine

## 2015-07-14 DIAGNOSIS — R55 Syncope and collapse: Secondary | ICD-10-CM

## 2015-07-14 DIAGNOSIS — I10 Essential (primary) hypertension: Secondary | ICD-10-CM | POA: Insufficient documentation

## 2015-07-14 LAB — CBC, DIFF
% Basophils: 1 %
% Eosinophils: 1 %
% Immature Granulocytes: 0 %
% Lymphocytes: 20 %
% Monocytes: 7 %
% Neutrophils: 71 %
% Nucleated RBC: 0 %
Absolute Eosinophil Count: 0.08 10*3/uL (ref 0.00–0.50)
Absolute Lymphocyte Count: 1.31 10*3/uL (ref 1.00–4.80)
Basophils: 0.04 10*3/uL (ref 0.00–0.20)
Hematocrit: 43 % (ref 38–50)
Hemoglobin: 14.4 g/dL (ref 13.0–18.0)
Immature Granulocytes: 0.01 10*3/uL (ref 0.00–0.05)
MCH: 35.3 pg — ABNORMAL HIGH (ref 27.3–33.6)
MCHC: 33.6 g/dL (ref 32.2–36.5)
MCV: 105 fL — ABNORMAL HIGH (ref 81–98)
Monocytes: 0.42 10*3/uL (ref 0.00–0.80)
Neutrophils: 4.63 10*3/uL (ref 1.80–7.00)
Nucleated RBC: 0 10*3/uL
Platelet Count: 168 10*3/uL (ref 150–400)
RBC: 4.08 10*6/uL — ABNORMAL LOW (ref 4.40–5.60)
RDW-CV: 12.6 % (ref 11.6–14.4)
WBC: 6.49 10*3/uL (ref 4.3–10.0)

## 2015-07-14 LAB — COMPREHENSIVE METABOLIC PANEL
ALT (GPT): 17 U/L (ref 10–48)
AST (GOT): 23 U/L (ref 9–38)
Albumin: 4.1 g/dL (ref 3.5–5.2)
Alkaline Phosphatase (Total): 77 U/L (ref 37–159)
Anion Gap: 10 (ref 4–12)
Bilirubin (Total): 0.4 mg/dL (ref 0.2–1.3)
Calcium: 8.8 mg/dL — ABNORMAL LOW (ref 8.9–10.2)
Carbon Dioxide, Total: 26 meq/L (ref 22–32)
Chloride: 105 meq/L (ref 98–108)
Creatinine: 1.02 mg/dL (ref 0.51–1.18)
GFR, Calc, African American: >60 mL/min/{1.73_m2}
GFR, Calc, European American: >60 mL/min/{1.73_m2}
Glucose: 105 mg/dL (ref 62–125)
Potassium: 4.8 meq/L (ref 3.6–5.2)
Protein (Total): 7.2 g/dL (ref 6.0–8.2)
Sodium: 141 meq/L (ref 135–145)
Urea Nitrogen: 30 mg/dL — ABNORMAL HIGH (ref 8–21)

## 2015-07-14 LAB — TROPONIN_I
Troponin_I Interpretation: NORMAL
Troponin_I: <0.03 ng/mL (ref <0.04)

## 2015-07-14 LAB — MAGNESIUM: Magnesium: 2 mg/dL (ref 1.8–2.4)

## 2015-07-17 ENCOUNTER — Telehealth (INDEPENDENT_AMBULATORY_CARE_PROVIDER_SITE_OTHER): Payer: Self-pay | Admitting: Internal Medicine

## 2015-07-17 NOTE — Telephone Encounter (Signed)
Please scan note to chart  Follow up please schedule

## 2015-07-31 ENCOUNTER — Encounter (INDEPENDENT_AMBULATORY_CARE_PROVIDER_SITE_OTHER): Payer: Self-pay | Admitting: Neurology

## 2015-07-31 ENCOUNTER — Ambulatory Visit (INDEPENDENT_AMBULATORY_CARE_PROVIDER_SITE_OTHER): Payer: PPO | Admitting: Neurology

## 2015-07-31 VITALS — BP 140/96 | HR 111 | Ht 74.25 in | Wt 209.0 lb

## 2015-07-31 DIAGNOSIS — R55 Syncope and collapse: Secondary | ICD-10-CM

## 2015-07-31 DIAGNOSIS — G379 Demyelinating disease of central nervous system, unspecified: Secondary | ICD-10-CM

## 2015-07-31 DIAGNOSIS — R251 Tremor, unspecified: Secondary | ICD-10-CM

## 2015-07-31 DIAGNOSIS — M545 Low back pain: Secondary | ICD-10-CM

## 2015-07-31 DIAGNOSIS — G8929 Other chronic pain: Secondary | ICD-10-CM

## 2015-07-31 MED ORDER — PRIMIDONE 50 MG OR TABS
25.0000 mg | ORAL_TABLET | Freq: Every day | ORAL | Status: DC
Start: 2015-07-31 — End: 2015-10-09

## 2015-07-31 NOTE — Progress Notes (Signed)
The patient presents today for Peripheral  neuropathy , 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   Last visit: 06/26/2015  Vital complete:  YES  Care Everywhere records available/ reconciled:  YES  PHQ2 complete:  YES  Pharmacy complete: YES   Refills pended:  NO  Medications reviewed:  YES    Medical, surgical, family, and social updated:  YES  Health Maintenance updated:  YES  Recent imaging or hospital visit?:  NO (if yes=pull up images/documents)  Physician added to patient's care team:  YES

## 2015-07-31 NOTE — Patient Instructions (Addendum)
I am referring you for 24 heart monitoring to investigate your recent fainting spell.    I recommend a trial of primidone for management of your tremor: begin 25  Mg (1/2 tablet) at bedtime for 2 weeks.  If no improvement increase the dose to 50 mg.    Follow up with the [place] pain clinic regarding your low back pain.    Return for follow up in 1-2 months.  Report any recurrence of fainting spells as soon as possible.

## 2015-07-31 NOTE — Progress Notes (Signed)
[facility]  Physician: Milus Mallick, MD   Phone: [phone]  Today's date: 07/31/2015     HPI/CHIEF COMPLAINT:  Ronnie Mason is a 64 year old man who returns for a follow up of tremor, demyelinating lesions on brain MRI, lower back pain, bilateral foot paraesthesia, and gait problems.    He continues to have a tremor in his extremities, left greater than right. The tremor worsens while standing in the morning. It subsides by 1-2PM. Walking improves tremor. He discontinued taking Nadolol on 07/02/15 because of concerns that it was affecting blood pressure and resulting in presyncope.    He was seen at [facility] on 07/14/15 for syncope and collapse. Work up was negative. He was watching the football game with his neighbor. After the game, he left the door open for his dog and then was found collapsed in his bathtub by his neighbor. He did not entirely "black out." No alcohol use at the time.  He has been abstinent for the last  month.  In the ER supine BP was 164/115 upon presentation.  No postural vitals were recorded .  The episode was considered to be of unknown cause.  Followed by Dr. Aundria Rud. He had a holter monitor study 7 years ago.    He continues to have lower back pain and numbness. The pain is rated 4-5/10 today. He is doing physical therapy. Using "roller balls" on the bottom of his feet improves numbness. Left leg is affected more than the right.  He had 1 treatment for lower back pain and "it didn't take." It was beneficial for neck pain. He is interested in another nerve block treatment.     He quit drinking alcohol on 07/02/15.    REVIEW OF SYSTEMS:  Neurological: See HPI  Cardiology: See HPI  Musculoskeletal: See HPI    PHYSICAL EXAM:  Constitutional: This is a well-developed, pleasant but mildly anxious male in no acute distress.  BP 140/96 mmHg   Pulse 111   Ht 6' 2.25" (1.886 m)   Wt 209 lb (94.802 kg)   BMI 26.65 kg/m2  Mental Status: Alert and oriented, with intact attention,  concentration, memory, language and fund of knowledge.  Cranial Nerves: Oculomotor intact. No facial weakness. No hearing loss. No dysarthria.  Motor: No abnormalities of tone, bulk, or power. There is no  incoordination in the limbs. Gait is symmetrical. There is no postural instability. Tremor, fine left hand more affected.    ASSESSMENT/PLAN:     ICD-10-CM ICD-9-CM    1. Syncope R55 780.2 REFERRAL TO ECG SERVICES   2. 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 Chronic low back pain, unspecified back pain laterality, with sciatica presence unspecified M54.5 724.2     G89.29 338.29      Recurrent syncope remains a concern.  Cardiac monitoring recommended.  Follow-up has already been planned with Dr. Aundria Rud next month.    Reports of gait instability may be related to "orthostatic tremor".  A trial of primidone is planned.  This entity is reported to sometimes responds to clonazepam.    He should continue with physical therapy for his low back pain.  Weight loss and improvement in his conditioning is recommended.  However, if he is interested in obtaining more injection treatments, I have advised that he consult with the pain clinic again.  I expressed some skepticism however, regarding the efficacy of these  treatments.    Follow-up of his abnormal MRI scan is planned for March.  I wish to see him in the meantime when repeat MRI scan of the brain will be ordered.    Patient Instructions   I am referring you for 24 heart monitoring to investigate your recent fainting spell.    I recommend a trial of primidone for management of your tremor: begin 25  Mg (1/2 tablet) at bedtime for 2 weeks.  If no improvement increase the dose to 50 mg.    Follow up with the [facility] pain clinic regarding your low back pain.    Return for follow up in 1-2 months.  Report any recurrence of fainting spells as soon as possible.    Prior EMR records reviewed as available  and clinically relevant. All questions answered. Discharge and follow up instructions were discussed with the patient and/or accompanying persons.    LAB RESULTS:  Hospital Encounter on 07/14/15   1. CBC, DIFF   Result Value Ref Range    WBC 6.49 4.3 - 10.0 10*3/uL    RBC 4.08 (L) 4.40 - 5.60 10*6/uL    Hemoglobin 14.4 13.0 - 18.0 g/dL    Hematocrit 43 38 - 50 %    MCV 105 (H) 81 - 98 fL    MCH 35.3 (H) 27.3 - 33.6 pg    MCHC 33.6 32.2 - 36.5 g/dL    Platelet Count 604168 540150 - 400 10*3/uL    RDW-CV 12.6 11.6 - 14.4 %    % Neutrophils 71 %    % Lymphocytes 20 %    % Monocytes 7 %    % Eosinophils 1 %    % Basophils 1 %    % Immature Granulocytes 0 %    Neutrophils 4.63 1.80 - 7.00 10*3/uL    Absolute Lymphocyte Count 1.31 1.00 - 4.80 10*3/uL    Monocytes 0.42 0.00 - 0.80 10*3/uL    Absolute Eosinophil Count 0.08 0.00 - 0.50 10*3/uL    Basophils 0.04 0.00 - 0.20 10*3/uL    Immature Granulocytes 0.01 0.00 - 0.05 10*3/uL    Nucleated RBC 0.00 0.00 10*3/uL    % Nucleated RBC 0 %   2. COMPREHENSIVE METABOLIC PANEL   Result Value Ref Range    Sodium 141 135 - 145 meq/L    Potassium 4.8 3.6 - 5.2 meq/L    Chloride 105 98 - 108 meq/L    Carbon Dioxide, Total 26 22 - 32 meq/L    Anion Gap 10 4 - 12    Glucose 105 62 - 125 mg/dL    Urea Nitrogen 30 (H) 8 - 21 mg/dL    Creatinine 9.811.02 1.910.51 - 1.18 mg/dL    Protein (Total) 7.2 6.0 - 8.2 g/dL    Albumin 4.1 3.5 - 5.2 g/dL    Bilirubin (Total) 0.4 0.2 - 1.3 mg/dL    Calcium 8.8 (L) 8.9 - 10.2 mg/dL    AST (GOT) 23 9 - 38 U/L    Alkaline Phosphatase (Total) 77 37 - 159 U/L    ALT (GPT) 17 10 - 48 U/L    GFR, Calc, European American >60 mL/min/[1.73_m2]    GFR, Calc, African American >60 mL/min/[1.73_m2]    GFR, Information       Calculated GFR in mL/min/1.73 m2 by MDRD equation.  Inaccurate with changing renal function.  See http://[email or web address]/labweb/test/bclim/cGFR.html   3. MAGNESIUM   Result Value Ref Range    Magnesium 2.0 1.8 - 2.4  mg/dL   4. TROPONIN_I   Result Value  Ref Range    Troponin_I <0.03 <0.04 ng/mL    Troponin_I Interpretation Normal        PROBLEM LIST:  Patient Active Problem List   Diagnosis    Syncope    Hypotension    Essential hypertension    Carotid Sinus Hypersensitivity    URI (upper respiratory infection)    Cervicalgia    Hyperlipidemia    Chronic pain    Bee sting allergy    Numbness and tingling of leg    Chronic back pain    B12 deficiency    Chronic renal insufficiency, stage III (moderate)    Essential tremor    Back pain, lumbosacral    Lumbar radiculopathy, chronic    Neck pain    Fall    Lumbosacral radiculopathy at S1    Tremor    Sensory neuropathy    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 myelopathy or radiculopathy    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)    History of alcohol dependence (HCC)       PAST MEDICAL HISTORY:  Past Medical History   Diagnosis Date    Syncope     HYPOTENSION      HYPERTENSION      Carotid Sinus Hypersensitivity     URI (upper respiratory infection)     History of EKG 5/97    Lipidemia     Chronic kidney disease     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 myelopathy or radiculopathy 05/28/2014       SURGICAL HISTORY:  Past Surgical History   Procedure Laterality Date    Unlisted procedure hands/fingers      Unlisted procedure femur/knee      Anes; colonoscopy  2002     repeat in 3 years    Anes; colonoscopy & polypectomy  11/18/2007     repeat in 3 years    Unlisted procedure spine  2013     rfa to c3 to c 7        MEDICATIONS:  Current Outpatient Prescriptions   Medication Sig Dispense Refill    Atorvastatin Calcium 10 MG Oral Tab Take 1 tablet (10 mg) by mouth daily. 90 tablet 1    Cyanocobalamin 1000 MCG Oral Tab one per day over-the-counter started 5/21 /2012 this level borderline low 1 Tab 1    Diclofenac Sodium (VOLTAREN) 1 % Transdermal Gel Apply 4 g topically 4 times a day. Apply to neck, trapezius muscle, and low back. No more  than 4 g applied at a time. 1 Tube 5    DULoxetine HCl 60 MG Oral CAPSULE ENTERIC COATED PARTICLES Take 1 capsule (60 mg) by mouth daily. 90 capsule 1    EPINEPHrine 0.3 MG/0.3ML Injection Solution Auto-injector Inject as instructed per patient package insert, 0.3 mg intramuscularly or subcutaneously into the thigh, if needed to treat anaphylaxis 2 Autoinjector 0    Hydrocodone-Acetaminophen 5-325 MG Oral Tab 1-2 po in am as needed 60 tablet 0    [START ON 08/01/2015] Hydrocodone-Acetaminophen 5-325 MG Oral Tab 1-2 po in am as needed 60 tablet 0    [START ON 08/31/2015] Hydrocodone-Acetaminophen 5-325 MG Oral Tab 1-2 po in am as needed 60 tablet 0    Ibuprofen 200 MG Oral Tab Take 1 tablet (200 mg) by mouth. Give with food.mid day as needed for neck and back pain      Lidocaine 5 %  External Patch Apply 1 patch onto the skin daily. Apply to painful area for up to 12 hours in a 24 hour period. 30 patch 12    Lisinopril 40 MG Oral Tab TAKE ONE TABLET BY MOUTH ONCE DAILY 90 tablet 1    Nadolol 20 MG Oral Tab Take 1 tablet (20 mg) by mouth daily as needed (tremor). 30 tablet 2    Nadolol 40 MG Oral Tab Take 1 tablet (40 mg) by mouth daily. (Patient taking differently: Take 40 mg by mouth daily. Patient will be going off medication at the end of the week per Dr. Miguel Dibble) 180 tablet 3    Primidone 50 MG Oral Tab Take 0.5-1 tablets (25-50 mg) by mouth daily. 30 tablet 3     No current facility-administered medications for this visit.       ALLERGIES:  Review of patient's allergies indicates:  Allergies   Allergen Reactions    Bee Venom Hives, Itching and Swelling    Gabapentin      Flu like symptoms, diarrhea, body ache upset stomach    Lidocaine Rash     Lidocaine patch. PATIENT STATES HE IS ALLERGIC TO THE ADHESIVE NOT THE PATCH.       FAMILY HISTORY:  Family History   Problem Relation Age of Onset    Colon Cancer Father     Heart (other) Mother      MI    Other Family Hx       myelodysplasia        SOCIAL HISTORY:  Social History     Social History    Marital Status: Married     Spouse Name: N/A    Number of Children: N/A    Years of Education: N/A     Occupational History    Not on file.     Social History Main Topics    Smoking status: Never Smoker     Smokeless tobacco: Never Used    Alcohol Use: Yes    Drug Use: No    Sexual Activity: Not on file     Other Topics Concern    Not on file     Social History Narrative         Total time of greater than 45 minutes was spent of which greater than 50% was spent in coordination of care and counseling with the patient, as outlined in this note.    Portions of today's documentation have been created with the assistance of voice recognition software. Therefore, it may contain anomalous punctuation, inadvertent misrecognitions, word substitutions, insertions or omissions. Occasional wrong-word or sound-alike substitutions may also occur, all due to the inherent limitations of voice recognition software. Attempts to correct the above have been made by Dr. Miguel Dibble, but it is recommended that the chart be read carefully to recognize, using context, where these substitutions may have occurred. Please notify us if a serious error or discrepancy is present so that it may be corrected or if there is confusion regarding this document.    Portions of this chart were written by medical scribe, Loletha Grayer, with oversight by Dr. Liz Beach Kirschner on 07/31/2015 12:26 PM    I, Christean Grief, MD, interviewed and examined the patient while overseeing the documentation performed by my Medical Scribe, Johnnye Lana. I have reviewed and revised as necessary the scribes note and agree with the documented findings and plan of care. Please refer to the scribe's note above for further detailed information regarding the  patient encounter and exam.  07/31/2015. 1:40 PM

## 2015-08-01 ENCOUNTER — Telehealth (HOSPITAL_BASED_OUTPATIENT_CLINIC_OR_DEPARTMENT_OTHER): Payer: Self-pay | Admitting: Anesthesiology

## 2015-08-01 NOTE — Telephone Encounter (Signed)
(  TEXTING IS AN OPTION FOR [facility] CLINICS ONLY)  Is this a [facility] clinic? No      RETURN CALL: Detailed message on voicemail only      SUBJECT:  Appointment Request     REASON FOR REQUEST/SYMPTOMS: discuss injections in lower back  REFERRING PROVIDER: n/a  REQUEST APPOINTMENT WITH: Dale,Rebecca  REQUESTED DATE: Wednesdays or Thursdays TIME: 10-12  UNABLE TO APPOINT BECAUSE: Per SOP, send TE. Please contact to discuss. Thank you

## 2015-08-02 NOTE — Telephone Encounter (Signed)
Phoebe,    Injection appointment or questions. Thanks

## 2015-08-04 DIAGNOSIS — I639 Cerebral infarction, unspecified: Secondary | ICD-10-CM

## 2015-08-04 DIAGNOSIS — I619 Nontraumatic intracerebral hemorrhage, unspecified: Secondary | ICD-10-CM

## 2015-08-04 HISTORY — DX: Nontraumatic intracerebral hemorrhage, unspecified: I61.9

## 2015-08-04 HISTORY — DX: Cerebral infarction, unspecified: I63.9

## 2015-08-07 NOTE — Telephone Encounter (Signed)
Informed patient of the Dr [name] maternity leave and will try to schedule his procedure with another physician. When I  have the schedule available I will call him back. Pt expressed understanding.OOr

## 2015-08-08 ENCOUNTER — Other Ambulatory Visit (INDEPENDENT_AMBULATORY_CARE_PROVIDER_SITE_OTHER): Payer: PPO

## 2015-08-08 DIAGNOSIS — R55 Syncope and collapse: Secondary | ICD-10-CM

## 2015-08-08 NOTE — Progress Notes (Signed)
24 hour holter monitor placed on patient, instructions given, patient verbalized understanding

## 2015-08-15 ENCOUNTER — Other Ambulatory Visit (INDEPENDENT_AMBULATORY_CARE_PROVIDER_SITE_OTHER): Payer: PPO

## 2015-08-15 DIAGNOSIS — R55 Syncope and collapse: Secondary | ICD-10-CM

## 2015-08-15 NOTE — Progress Notes (Signed)
24 hour holter monitor placed on patient, instructions given, patient verbalized understanding

## 2015-08-27 ENCOUNTER — Ambulatory Visit (INDEPENDENT_AMBULATORY_CARE_PROVIDER_SITE_OTHER): Payer: PPO | Admitting: Cardiovascular Disease

## 2015-08-27 ENCOUNTER — Encounter (INDEPENDENT_AMBULATORY_CARE_PROVIDER_SITE_OTHER): Payer: Self-pay | Admitting: Cardiovascular Disease

## 2015-08-27 VITALS — BP 158/110 | HR 104 | Ht 74.25 in | Wt 214.7 lb

## 2015-08-27 DIAGNOSIS — I950 Idiopathic hypotension: Secondary | ICD-10-CM

## 2015-08-27 DIAGNOSIS — I10 Essential (primary) hypertension: Secondary | ICD-10-CM

## 2015-08-27 DIAGNOSIS — R55 Syncope and collapse: Secondary | ICD-10-CM

## 2015-08-27 NOTE — Patient Instructions (Signed)
Wide fluctuations in Blood pressure

## 2015-08-28 NOTE — Progress Notes (Signed)
Cardiovascular Clinical Evaluation Note    Primary Care Provider: [name], MD    Referring Provider: No ref. provider found    DOB:  01-01-1951    CHIEF COMPLAINT:  Syncope, hyper and hypotension    HISTORY OF PRESENT ILLNESS:  Patient reports a syncopal event on 12/11.  He had been watching football game.  He describes sitting down to urinate and then stood up and passed out into the bathtub.  Fortunately he did not sustain any injury.  He does have a history of orthostasis.  At that time he had also been titrated up on her nadolol dose from 20 to a total of 80 mg since this summer.  He was weaned off of the nadolol and has had no orthostatic symptoms since that time.  Blood pressure remains problematic with him as he has both extremes of hyper and hypotension.   The nadolol had been given because of tremor.  He has since been switched to primidone which she thinks is working well.    Holter monitoring earlier this month was unremarkable.  EKG and from the emergency room showed normal sinus rhythm at 94 bpm.    PROBLEM LIST:  Patient Active Problem List    Diagnosis Date Noted   . Cervicalgia [M54.2] 03/11/2010     Radiofrequency ablation of c3 to c 7     . Syncope [R55]    . Hypotension [I95.9]    . Essential hypertension [I10]    . 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 URI (upper respiratory infection) [J06.9]    . History of alcohol dependence (HCC) [F10.21] 06/26/2015   . 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 Idiopathic peripheral neuropathy [G60.9] 03/15/2015   . 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 Tremor [R25.1] 04/20/2013   . Lumbosacral radiculopathy at S1 [M54.17] 03/22/2013     Left     . Fall [W19.XXXA] 02/23/2013     BP < 100  Occasioanal tri[p and leg weakness     . Essential tremor [G25.0] 02/15/2013   . Back pain, lumbosacral [M54.5, M54.89] 02/15/2013   . Lumbar  radiculopathy, chronic [M54.16] 02/15/2013   . Neck pain [M54.2] 02/15/2013   . Chronic pain [G89.29] 12/21/2012     Now at [place], DO, Santiago Glad    Neck pain -burn c3-7 on left side per patient  Some trigger injection    Lumbar pain-since 80's better with exercise     . 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 Numbness and tingling of leg [R20.2] 12/21/2012     Left calf -left foot long term suspect from back-suspect L 45     . Chronic back pain [M54.9, G89.29] 12/21/2012     Mri in mid scape 1/214  Multilevel degenerative disc disease of the lumbar spine. Circumferential disc bulge at all levels below and including L1-2. No significant central spinal canal stenosis or neuroforaminal narrowing.  2. Mild retrolisthesis of L5 on S1.           . B12 deficiency [E53.8] 12/21/2012     Borderline low 5 /21/2014     . Chronic renal insufficiency, stage III (moderate) [N18.3] 12/21/2012     5/ 2013     . Hyperlipidemia [E78.5] 09/26/2012       MEDICATIONS:    Current Outpatient Prescriptions   Medication  Sig Dispense Refill   . Atorvastatin Calcium 10 MG Oral Tab Take 1 tablet (10 mg) by mouth daily. 90 tablet 1   . Cyanocobalamin 1000 MCG Oral Tab one per day over-the-counter started 5/21 /2012 this level borderline low 1 Tab 1   . Diclofenac Sodium (VOLTAREN) 1 % Transdermal Gel Apply 4 g topically 4 times a day. Apply to neck, trapezius muscle, and low back. No more than 4 g applied at a time. 1 Tube 5   . DULoxetine HCl 60 MG Oral CAPSULE ENTERIC COATED PARTICLES Take 1 capsule (60 mg) by mouth daily. 90 capsule 1   . EPINEPHrine 0.3 MG/0.3ML Injection Solution Auto-injector Inject as instructed per patient package insert, 0.3 mg intramuscularly or subcutaneously into the thigh, if needed to treat anaphylaxis 2 Autoinjector 0   . Hydrocodone-Acetaminophen 5-325 MG Oral Tab 1-2 po in am as needed 60 tablet 0   . Ibuprofen 200 MG Oral Tab Take 1 tablet (200 mg) by mouth. Give with food.mid day  as needed for neck and back pain     . Lidocaine 5 % External Patch Apply 1 patch onto the skin daily. Apply to painful area for up to 12 hours in a 24 hour period. 30 patch 12   . Lisinopril 40 MG Oral Tab TAKE ONE TABLET BY MOUTH ONCE DAILY 90 tablet 1   . Primidone 50 MG Oral Tab Take 0.5-1 tablets (25-50 mg) by mouth daily. 30 tablet 3     No current facility-administered medications for this visit.       ALLERGIES:  Bee venom; Gabapentin; and Lidocaine      PSFH: His neck pain was dramatically improved with radiofrequency ablation at the nerve roots.  He is considering similar therapy for his low back pain.    REVIEW OF SYSTEMS:  See Cardiovascular HPI for pertinent positives and negatives.   RESPIRATORY:  No sputum, hemoptysis.    PHYSICAL EXAM    CONSTITUTIONAL:  This is a well-nourished male in no distress.   BP 158/110 mmHg  Pulse 104  Ht 6' 2.25" (1.886 m)  Wt 214 lb 11.2 oz (97.387 kg)  BMI 27.38 kg/m2  NECK:  Jugular venous pressure normal.  Thyroid is not enlarged.  RESPIRATORY:   Clear to percussion and auscultation.  CARDIOVASCULAR:  Regular rhythm.  S1 and S2 normal. No S3, S4, no murmur.  Carotid upstrokes are brisk without bruits.  EXTREMITIES:  No clubbing, cyanosis, edema or tenderness noted.      IMPRESSION :   1.  Syncope: I suspect that this was an exacerbation of his orthostatic hypotension  2.  Reactive hypertension  3.  Chronic pain    PLAN:    No changes were made for the time being.  We had started him last summer on amlodipine prior to initiation of the nadolol.  At this point, given his syncopal event I think we have to be careful about initiating any further antihypertensive medication.  He is to monitor his blood pressure    Electronically signed by Eilleen Kempf, MD    08/28/2015  1:35 PM    CC:   [name], MD  No ref. provider found

## 2015-09-03 ENCOUNTER — Encounter (HOSPITAL_BASED_OUTPATIENT_CLINIC_OR_DEPARTMENT_OTHER): Payer: PPO | Admitting: Critical Care Medicine

## 2015-09-06 ENCOUNTER — Telehealth (INDEPENDENT_AMBULATORY_CARE_PROVIDER_SITE_OTHER): Payer: Self-pay | Admitting: Internal Medicine

## 2015-09-06 ENCOUNTER — Ambulatory Visit: Payer: PPO | Attending: Internal Medicine

## 2015-09-06 DIAGNOSIS — I10 Essential (primary) hypertension: Secondary | ICD-10-CM

## 2015-09-06 DIAGNOSIS — E785 Hyperlipidemia, unspecified: Secondary | ICD-10-CM

## 2015-09-06 DIAGNOSIS — Z131 Encounter for screening for diabetes mellitus: Secondary | ICD-10-CM

## 2015-09-06 NOTE — Telephone Encounter (Signed)
Please let him know that labs are ordered please make an appointment to see me in clinic after labs are done

## 2015-09-06 NOTE — Telephone Encounter (Signed)
Pt's lab orders from last November expired and pt. showed up wanting to do his fasting labs today. Since the orders were not in, the pt. decided to come back fasting on Monday. Please re-order the labs from late November that were expired/cancelled.

## 2015-09-06 NOTE — Telephone Encounter (Signed)
Labs pended.

## 2015-09-09 NOTE — Telephone Encounter (Signed)
Called pt but no answer so left vm stating the labs have been ordered and he can go get them done any time.

## 2015-09-12 ENCOUNTER — Ambulatory Visit: Payer: PPO | Attending: Internal Medicine

## 2015-09-12 DIAGNOSIS — E785 Hyperlipidemia, unspecified: Secondary | ICD-10-CM | POA: Insufficient documentation

## 2015-09-12 DIAGNOSIS — Z131 Encounter for screening for diabetes mellitus: Secondary | ICD-10-CM | POA: Insufficient documentation

## 2015-09-12 LAB — COMPREHENSIVE METABOLIC PANEL
ALT (GPT): 25 U/L (ref 10–48)
AST (GOT): 26 U/L (ref 9–38)
Albumin: 4.4 g/dL (ref 3.5–5.2)
Alkaline Phosphatase (Total): 86 U/L (ref 37–159)
Anion Gap: 10 (ref 4–12)
Bilirubin (Total): 0.6 mg/dL (ref 0.2–1.3)
Calcium: 9.6 mg/dL (ref 8.9–10.2)
Carbon Dioxide, Total: 26 meq/L (ref 22–32)
Chloride: 102 meq/L (ref 98–108)
Creatinine: 1.12 mg/dL (ref 0.51–1.18)
GFR, Calc, African American: >60 mL/min/{1.73_m2}
GFR, Calc, European American: >60 mL/min/{1.73_m2}
Glucose: 99 mg/dL (ref 62–125)
Potassium: 5.1 meq/L (ref 3.6–5.2)
Protein (Total): 7.1 g/dL (ref 6.0–8.2)
Sodium: 138 meq/L (ref 135–145)
Urea Nitrogen: 24 mg/dL — ABNORMAL HIGH (ref 8–21)

## 2015-09-12 LAB — LIPID PANEL
Cholesterol (LDL): 86 mg/dL (ref <130)
Cholesterol/HDL Ratio: 1.7
HDL Cholesterol: 149 mg/dL (ref >39)
Non-HDL Cholesterol: 104 mg/dL (ref 0–159)
Total Cholesterol: 253 mg/dL — ABNORMAL HIGH (ref <200)
Triglyceride: 92 mg/dL (ref <150)

## 2015-09-12 NOTE — Telephone Encounter (Signed)
Quick Note:    Lipid is fine on atorvastatin 10 mg, see you soon  Chemistry is ok  aic not back    Telephone on 09/06/15  1. LIPID PANEL   Result Value Ref Range    Cholesterol (Total) 253 (H) <200 mg/dL    Triglyceride 92 <161 mg/dL    Cholesterol (HDL) 096 >39 mg/dL    Cholesterol (LDL) 86 <130 mg/dL    Non-HDL Cholesterol 104 0 - 159 mg/dL    Cholesterol/HDL Ratio 1.7    Lipid Panel, Additional Info. (NOTE)   2. COMPREHENSIVE METABOLIC PANEL   Result Value Ref Range    Sodium 138 135 - 145 meq/L    Potassium 5.1 3.6 - 5.2 meq/L    Chloride 102 98 - 108 meq/L    Carbon Dioxide, Total 26 22 - 32 meq/L    Anion Gap 10 4 - 12    Glucose 99 62 - 125 mg/dL    Urea Nitrogen 24 (H) 8 - 21 mg/dL    Creatinine 0.45 4.09 - 1.18 mg/dL    Protein (Total) 7.1 6.0 - 8.2 g/dL    Albumin 4.4 3.5 - 5.2 g/dL    Bilirubin (Total) 0.6 0.2 - 1.3 mg/dL    Calcium 9.6 8.9 - 81.1 mg/dL    AST (GOT) 26 9 - 38 U/L    Alkaline Phosphatase (Total) 86 37 - 159 U/L    ALT (GPT) 25 10 - 48 U/L    GFR, Calc, European American >60 mL/min/[1.73_m2]    GFR, Calc, African American >60 mL/min/[1.73_m2]    GFR, Information    Calculated GFR in mL/min/1.73 m2 by MDRD equation. Inaccurate with changing renal function. See http://depts.[employer].it.html      ______

## 2015-09-13 LAB — HEMOGLOBIN A1C, HPLC: Hemoglobin A1C: 5 % (ref 4.0–6.0)

## 2015-09-13 NOTE — Telephone Encounter (Signed)
Quick Note:    Addendum  Blood sugar is normal aic of 5.0  ______

## 2015-09-26 ENCOUNTER — Telehealth (HOSPITAL_BASED_OUTPATIENT_CLINIC_OR_DEPARTMENT_OTHER): Payer: Self-pay | Admitting: Acupuncturist

## 2015-09-26 NOTE — Telephone Encounter (Signed)
(  TEXTING IS AN OPTION FOR [facility] ONLY)  Is this a [facility] clinic? No      RETURN CALL: Detailed message on voicemail only      SUBJECT:  General Message     REASON FOR REQUEST: New provider appointment    MESSAGE: Patient called to check status on new provider appointment. Please follow up with patient. Thank you

## 2015-09-27 NOTE — Telephone Encounter (Signed)
Ronnie Mason,  Patient does not have a referral for [place] but looks like he was scheduled with Dr Vista Lawman? Do you know more about this? Thanks!

## 2015-10-01 ENCOUNTER — Ambulatory Visit (INDEPENDENT_AMBULATORY_CARE_PROVIDER_SITE_OTHER): Payer: PPO | Admitting: Internal Medicine

## 2015-10-01 ENCOUNTER — Encounter (INDEPENDENT_AMBULATORY_CARE_PROVIDER_SITE_OTHER): Payer: Self-pay | Admitting: Internal Medicine

## 2015-10-01 VITALS — BP 136/84 | HR 106 | Ht 74.25 in | Wt 211.1 lb

## 2015-10-01 DIAGNOSIS — M545 Low back pain, unspecified: Secondary | ICD-10-CM

## 2015-10-01 DIAGNOSIS — G8929 Other chronic pain: Secondary | ICD-10-CM

## 2015-10-01 DIAGNOSIS — M542 Cervicalgia: Secondary | ICD-10-CM

## 2015-10-01 DIAGNOSIS — R21 Rash and other nonspecific skin eruption: Secondary | ICD-10-CM

## 2015-10-01 DIAGNOSIS — G894 Chronic pain syndrome: Secondary | ICD-10-CM

## 2015-10-01 MED ORDER — CLOBETASOL PROPIONATE 0.05 % EX OINT
TOPICAL_OINTMENT | CUTANEOUS | Status: DC
Start: 2015-10-01 — End: 2018-02-18

## 2015-10-01 MED ORDER — HYDROCODONE-ACETAMINOPHEN 5-325 MG OR TABS
ORAL_TABLET | ORAL | Status: DC
Start: 2015-10-31 — End: 2015-12-17

## 2015-10-01 MED ORDER — HYDROCODONE-ACETAMINOPHEN 5-325 MG OR TABS
ORAL_TABLET | ORAL | Status: DC
Start: 2015-10-01 — End: 2015-12-17

## 2015-10-01 MED ORDER — HYDROCODONE-ACETAMINOPHEN 5-325 MG OR TABS
ORAL_TABLET | ORAL | Status: DC
Start: 2015-11-30 — End: 2015-12-17

## 2015-10-01 NOTE — Telephone Encounter (Signed)
Left a voice mail message for pt to call regarding scheduling.

## 2015-10-01 NOTE — Progress Notes (Signed)
Does patient have 3 or more complaints:  NO  If yes, discuss with patient the need to schedule a follow up appointment due to limited appointment time.  Follow up appointment scheduled:  YES  Care Everywhere records available/ reconciled:  YES  PHQ2 complete:  NO  PHQ9 complete:  NO  Refills pended:  YES  Orders pended:  NO  Medications to discontinue:  NO    Health Maintenance updated:  YES    Health Maintenance   Topic Date Due   . Colonoscopy  08/03/2013   . Diabetes Screen  09/11/2018   . Cholesterol Test  09/11/2020   . Tetanus Vaccine  12/22/2022   . Zoster Vaccine  Completed   . Influenza Vaccine  Completed   . Hepatitis C Screen  Addressed   . HIV Screen  Addressed

## 2015-10-01 NOTE — Progress Notes (Signed)
Ronnie Mason is a 65 year old male     Review of patient's allergies indicates:  Allergies   Allergen Reactions   . Bee Venom Hives, Itching and Swelling   . Gabapentin      Flu like symptoms, diarrhea, body ache upset stomach   . Lidocaine Rash     Lidocaine patch. PATIENT STATES HE IS ALLERGIC TO THE ADHESIVE NOT THE PATCH.       BP 136/84 mmHg  Pulse 106  Ht 6' 2.25" (1.886 m)  Wt 211 lb 2 oz (95.766 kg)  BMI 26.92 kg/m2    Chief Complaint   Patient presents with   . Follow Up Visit       HPI  History of:    Dr Juanetta Beets -tremor, demyelinating lesions on brain MRI, lower back pain, bilateral foot paraesthesia, and gait problems.   07/14/2015 [facility] syncope , collapse - not alcohol collapsed in tub , did not entirely passed out    chaged to primadone for termor    Last visit with Dr Aundria Rud - 08/27/2015 on syncope suspect from orthostatic hypotension - monitor w/o changes     Today follow-up above issues:     Patient is scheduled to possibly go back to Dr. Amada Jupiter or one of her partners and possibly get a steroid injection of the back   He also will be following up with Dr. Valetta Mole on the question of the demyelinating problem patient was not aware of this diagnosis   He tells me that the syncope is now no longer problem as he has changed to primidone for his essential tremor   He has followed up with Dr. Aundria Rud and he feels currently not lightheaded or dizzy   He maintains on 2 Vicodin pills per day which is a chronic dosage for him he is no longer drinking alcohol as of last fall and the medication brings his level of pain to about 5 out of 10 mainly for his back    he has stopped working a long time ago and he has no feeling of weakness in his hands however he cannot go back to work as sitting prolonged more than an hour and a half causes great discomfort in his shoulders and neck and history of radiofrequency ablation C3 -C7   He denies any fevers chills sweats    need refill  with  clobetasol for a rash that is recurrent-he said a touch of steroid cream makes the rash go away and only comes back rather infrequently      Telephone on 09/06/15   1. LIPID PANEL   Result Value Ref Range    Cholesterol (Total) 253 (H) <200 mg/dL    Triglyceride 92 <540 mg/dL    Cholesterol (HDL) 981 >39 mg/dL    Cholesterol (LDL) 86 <130 mg/dL    Non-HDL Cholesterol 104 0 - 159 mg/dL    Cholesterol/HDL Ratio 1.7     Lipid Panel, Additional Info. (NOTE)    2. HEMOGLOBIN A1C, HPLC   Result Value Ref Range    Hemoglobin A1C 5.0 4.0 - 6.0 %   3. COMPREHENSIVE METABOLIC PANEL   Result Value Ref Range    Sodium 138 135 - 145 meq/L    Potassium 5.1 3.6 - 5.2 meq/L    Chloride 102 98 - 108 meq/L    Carbon Dioxide, Total 26 22 - 32 meq/L    Anion Gap 10 4 - 12    Glucose 99 62 -  125 mg/dL    Urea Nitrogen 24 (H) 8 - 21 mg/dL    Creatinine 1.61 0.96 - 1.18 mg/dL    Protein (Total) 7.1 6.0 - 8.2 g/dL    Albumin 4.4 3.5 - 5.2 g/dL    Bilirubin (Total) 0.6 0.2 - 1.3 mg/dL    Calcium 9.6 8.9 - 04.5 mg/dL    AST (GOT) 26 9 - 38 U/L    Alkaline Phosphatase (Total) 86 37 - 159 U/L    ALT (GPT) 25 10 - 48 U/L    GFR, Calc, European American >60 mL/min/[1.73_m2]    GFR, Calc, African American >60 mL/min/[1.73_m2]    GFR, Information       Calculated GFR in mL/min/1.73 m2 by MDRD equation.  Inaccurate with changing renal function.  See [email or web address]         MEDICATION PRIOR TO VISIT  Reports   Outpatient Prescriptions Prior to Visit   Medication Sig Dispense Refill   . Atorvastatin Calcium 10 MG Oral Tab Take 1 tablet (10 mg) by mouth daily. 90 tablet 1   . Cyanocobalamin 1000 MCG Oral Tab one per day over-the-counter started 5/21 /2012 this level borderline low 1 Tab 1   . Diclofenac Sodium (VOLTAREN) 1 % Transdermal Gel Apply 4 g topically 4 times a day. Apply to neck, trapezius muscle, and low back. No more than 4 g applied at a time. 1 Tube 5   . DULoxetine HCl 60 MG Oral CAPSULE ENTERIC  COATED PARTICLES Take 1 capsule (60 mg) by mouth daily. 90 capsule 1   . EPINEPHrine 0.3 MG/0.3ML Injection Solution Auto-injector Inject as instructed per patient package insert, 0.3 mg intramuscularly or subcutaneously into the thigh, if needed to treat anaphylaxis 2 Autoinjector 0   . Hydrocodone-Acetaminophen 5-325 MG Oral Tab 1-2 po in am as needed 60 tablet 0   . Ibuprofen 200 MG Oral Tab Take 1 tablet (200 mg) by mouth. Give with food.mid day as needed for neck and back pain     . Lidocaine 5 % External Patch Apply 1 patch onto the skin daily. Apply to painful area for up to 12 hours in a 24 hour period. 30 patch 12   . Lisinopril 40 MG Oral Tab TAKE ONE TABLET BY MOUTH ONCE DAILY 90 tablet 1   . Primidone 50 MG Oral Tab Take 0.5-1 tablets (25-50 mg) by mouth daily. 30 tablet 3     No facility-administered medications prior to visit.         REVIEW OF SYSTEMS - are otherwise negative unless as noted in HPI above or as additional issues below    Constitutional:   Activities include woodworking and walking the dog   Eyes:    Head ears, nose mouth throat:     Cardiovascular:   Respiratory:     Gastrointestinal:   Genitourinary:      Musculoskeletal:    Integumentary:     Neurological:   Psychiatric/Behavioral:       Endocrine:     Hematological:   Allergic/Immunologic:        PHYSICAL EXAM  BP 136/84 mmHg  Pulse 106  Ht 6' 2.25" (1.886 m)  Wt 211 lb 2 oz (95.766 kg)  BMI 26.92 kg/m2    Well Dressed    Constitution:  In general looks:   well , good coloring  No diaphoresis    Patient is Alert  & Attentive  Patient is conversive    Psychiatric Mental Status / Neurologic  Mental Status:  Affect:  Pleasant   Calm  Mood:  Good  Awake: Alert and attentive  Not confused    Thought::     content : no gross issue   Process : logical  Language:   Speech is fluent  Intact comprehension    Eyes: no discharge, no gross puffiness, non- icteric    Neck:    Thyroid not enlarged  2+ carotid pulses no  bruits    Cardiovascular:   Heart Regular rate , rhythm, no murmur    No edema    Lymph nodes:  No gross cervical swelling  No gross axillary swelling    Respiratory:   Not tachyphpnic  Clear to ascultation  No rales, rhonchi , without increased effort or stridor  No clubbing         Musculoskeletal:   no atrophy noted in his hands    Skin   Back dollar size scale   out any scales on his scalp or elbow   No pitting nails nothing to really truly indicate psoriasis    Neurology   Memory:   Attention       Cranial Nerve    Eyes pupil and lid are intact   II, III, IV, VIII   Facial Movement VII: grossly intact, no facial droop     Motor- Gait-coordination: get up and go normal, no gross weakness   Gait- base normal appropriate for age   Get up and go -no problem       Hematologic/Lymphatic /Immunologic:  No gross bruising or rash or hives       IMPRESSION / PLAN / DISCUSSION     Jarvin was seen today for follow up visit.    Diagnoses and all orders for this visit:    Chronic bilateral low back pain without sciatica  -     Hydrocodone-Acetaminophen 5-325 MG Oral Tab; 1-2 po in am as needed  -     Hydrocodone-Acetaminophen 5-325 MG Oral Tab; 1- 2 in am as needed  -     Hydrocodone-Acetaminophen 5-325 MG Oral Tab; 1-2 po in am as needed    Chronic pain syndrome  -     Hydrocodone-Acetaminophen 5-325 MG Oral Tab; 1-2 po in am as needed  -     Hydrocodone-Acetaminophen 5-325 MG Oral Tab; 1- 2 in am as needed  -     Hydrocodone-Acetaminophen 5-325 MG Oral Tab; 1-2 po in am as needed    Cervicalgia  -     Hydrocodone-Acetaminophen 5-325 MG Oral Tab; 1-2 po in am as needed  -     Hydrocodone-Acetaminophen 5-325 MG Oral Tab; 1- 2 in am as needed  -     Hydrocodone-Acetaminophen 5-325 MG Oral Tab; 1-2 po in am as needed    Rash  -     Clobetasol Propionate 0.05 % External Ointment; Small amount to rash as needed bid for 1-2 weeks at a time         chronic pain for which he is already on duloxetine he feels his balance is worse  and it's coming from his back   He is working closely with Dr. Valetta Mole on this and he was not really aware that he had a concern for demyelinating changes on the brain per neurology    Disability paperwork given to patient had recent neurology workup and also workup with cardiology to paperwork along with MRI scans/2016 brain and spine    And he will discuss  further he feels his gait and stability is getting worse    He is not drinking alcohol he does not feel depressegad7 /phq9   wpmp  Previous drug screen       medicATION as of Discharge  Current Outpatient Prescriptions   Medication Sig Dispense Refill   . Atorvastatin Calcium 10 MG Oral Tab Take 1 tablet (10 mg) by mouth daily. 90 tablet 1   . Clobetasol Propionate 0.05 % External Ointment Small amount to rash as needed bid for 1-2 weeks at a time 15 g 1   . Cyanocobalamin 1000 MCG Oral Tab one per day over-the-counter started 5/21 /2012 this level borderline low 1 Tab 1   . Diclofenac Sodium (VOLTAREN) 1 % Transdermal Gel Apply 4 g topically 4 times a day. Apply to neck, trapezius muscle, and low back. No more than 4 g applied at a time. 1 Tube 5   . DULoxetine HCl 60 MG Oral CAPSULE ENTERIC COATED PARTICLES Take 1 capsule (60 mg) by mouth daily. 90 capsule 1   . EPINEPHrine 0.3 MG/0.3ML Injection Solution Auto-injector Inject as instructed per patient package insert, 0.3 mg intramuscularly or subcutaneously into the thigh, if needed to treat anaphylaxis 2 Autoinjector 0   . Hydrocodone-Acetaminophen 5-325 MG Oral Tab 1-2 po in am as needed 60 tablet 0   . [START ON 10/31/2015] Hydrocodone-Acetaminophen 5-325 MG Oral Tab 1- 2 in am as needed 60 tablet 0   . [START ON 11/30/2015] Hydrocodone-Acetaminophen 5-325 MG Oral Tab 1-2 po in am as needed 60 tablet 0   . Ibuprofen 200 MG Oral Tab Take 1 tablet (200 mg) by mouth. Give with food.mid day as needed for neck and back pain     . Lidocaine 5 % External Patch Apply 1 patch onto the skin daily. Apply to painful  area for up to 12 hours in a 24 hour period. 30 patch 12   . Lisinopril 40 MG Oral Tab TAKE ONE TABLET BY MOUTH ONCE DAILY 90 tablet 1   . Primidone 50 MG Oral Tab Take 0.5-1 tablets (25-50 mg) by mouth daily. 30 tablet 3     No current facility-administered medications for this visit.         Risk of taking medication properly and follow up discussed especially to call if problem    Patient   Family Member -   express understanding of care plan/medications    I spent a total time of 40 minutes face-to-face with the patient, of which more than 50% was spent counseling and coordinating care as outlined in this note.    Return in about 3 months (around 12/29/2015).     Cc to following MD:    Kirschner             ++++++++++++++++++++++++++++++++++++++++++++++++++        Patient Active Problem List    Diagnosis Date Noted   . History of alcohol dependence (HCC) [F10.21] 06/26/2015   . 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 Idiopathic peripheral neuropathy [G60.9] 03/15/2015   . 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 Tremor [R25.1] 04/20/2013   . Lumbosacral radiculopathy at S1 [M54.17] 03/22/2013     Left     . Fall [W19.XXXA] 02/23/2013     BP < 100  Occasioanal tri[p and leg weakness     . Essential tremor [G25.0] 02/15/2013   . Back  pain, lumbosacral [M54.5, M54.89] 02/15/2013   . Lumbar radiculopathy, chronic [M54.16] 02/15/2013   . Neck pain [M54.2] 02/15/2013   . Chronic pain [G89.29] 12/21/2012     Now at [facility], DO, Santiago Glad    Neck pain -burn c3-7 on left side per patient  Some trigger injection    Lumbar pain-since 80's better with exercise     . 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 Numbness and tingling of leg [R20.2] 12/21/2012     Left calf -left foot long term suspect from back-suspect L 45     . Chronic back pain [M54.9, G89.29] 12/21/2012     Mri in mid  scape 1/214  Multilevel degenerative disc disease of the lumbar spine. Circumferential disc bulge at all levels below and including L1-2. No significant central spinal canal stenosis or neuroforaminal narrowing.  2. Mild retrolisthesis of L5 on S1.           . B12 deficiency [E53.8] 12/21/2012     Borderline low 5 /21/2014     . Chronic renal insufficiency, stage III (moderate) [N18.3] 12/21/2012     5/ 2013     . Hyperlipidemia [E78.5] 09/26/2012   . Cervicalgia [M54.2] 03/11/2010     Radiofrequency ablation of c3 to c 7     . Syncope [R55]    . Hypotension [I95.9]    . Essential hypertension [I10]    . 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 URI (upper respiratory infection) [J06.9]          Past Medical History   Diagnosis Date   . Syncope    . HYPOTENSION     . HYPERTENSION     . Carotid Sinus Hypersensitivity    . URI (upper respiratory infection)    . History of EKG 5/97   . Lipidemia    . Chronic kidney disease    . 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 myelopathy or radiculopathy 05/28/2014       Social History     Social History   . Marital Status: Married     Spouse Name: N/A   . Number of Children: N/A   . Years of Education: N/A     Occupational History   . Not on file.     Social History Main Topics   . Smoking status: Never Smoker    . Smokeless tobacco: Never Used   . Alcohol Use: Yes   . Drug Use: No   . Sexual Activity: Not on file     Other Topics Concern   . Not on file     Social History Narrative         Family History   Problem Relation Age of Onset   . Colon Cancer Father    . Heart (other) Mother      MI   . Other Family Hx       myelodysplasia       Social History     Social History   . Marital Status: Married     Spouse Name: N/A   . Number of Children: N/A   . Years of Education: N/A     Social History Main Topics   . Smoking status: Never Smoker    . Smokeless tobacco: Never Used   . Alcohol Use: Yes   . Drug Use: No   . Sexual Activity: Not Asked     Other Topics Concern   .  None      Social History Narrative       Health Maintenance   Topic Date Due   . Diabetes Screen  09/11/2018   . Cholesterol Test  09/11/2020   . Colonoscopy  09/19/2020   . Tetanus Vaccine  12/22/2022   . Zoster Vaccine  Completed   . Influenza Vaccine  Completed   . Hepatitis C Screen  Addressed   . HIV Screen  Addressed

## 2015-10-02 ENCOUNTER — Telehealth (INDEPENDENT_AMBULATORY_CARE_PROVIDER_SITE_OTHER): Payer: Self-pay | Admitting: Internal Medicine

## 2015-10-02 NOTE — Telephone Encounter (Signed)
Called patient back, he states that he did not receive L spine report.  I will send it through [facility] per patient request

## 2015-10-02 NOTE — Telephone Encounter (Signed)
RETURN CALL: General message OK    SUBJECT:  General Message     REASON FOR REQUEST: patient is asking for clinical staff member Paulla Fore to give him a call back.     MESSAGE: see above.

## 2015-10-03 NOTE — Telephone Encounter (Signed)
Spoke with [name], he will pick up copy in clinic this afternoon

## 2015-10-03 NOTE — Telephone Encounter (Signed)
Teo called back. Please call at [phone]

## 2015-10-05 ENCOUNTER — Encounter: Payer: Self-pay | Admitting: Internal Medicine

## 2015-10-09 ENCOUNTER — Ambulatory Visit (INDEPENDENT_AMBULATORY_CARE_PROVIDER_SITE_OTHER): Payer: PPO | Admitting: Neurology

## 2015-10-09 VITALS — BP 158/102 | HR 114 | Ht 74.25 in | Wt 211.0 lb

## 2015-10-09 DIAGNOSIS — G250 Essential tremor: Secondary | ICD-10-CM

## 2015-10-09 DIAGNOSIS — G9389 Other specified disorders of brain: Secondary | ICD-10-CM | POA: Insufficient documentation

## 2015-10-09 DIAGNOSIS — G629 Polyneuropathy, unspecified: Secondary | ICD-10-CM

## 2015-10-09 DIAGNOSIS — G379 Demyelinating disease of central nervous system, unspecified: Secondary | ICD-10-CM

## 2015-10-09 DIAGNOSIS — F1021 Alcohol dependence, in remission: Secondary | ICD-10-CM

## 2015-10-09 DIAGNOSIS — Q043 Other reduction deformities of brain: Secondary | ICD-10-CM

## 2015-10-09 MED ORDER — PRIMIDONE 50 MG OR TABS
50.0000 mg | ORAL_TABLET | Freq: Two times a day (BID) | ORAL | Status: DC
Start: 2015-10-09 — End: 2016-01-02

## 2015-10-09 NOTE — Progress Notes (Signed)
The patient presents today for Syncope  Last visit: 07/31/2015  Vital complete:  YES  Care Everywhere records available/ reconciled:  YES  PHQ2 complete:  YES  Pharmacy complete: YES   Refills pended:  NO  Medications reviewed:  YES    Medical, surgical, family, and social updated:  YES  Health Maintenance updated:  YES  Recent imaging or hospital visit?:  NO (if yes=pull up images/documents)  Physician added to patient's care team:  YES

## 2015-10-09 NOTE — Patient Instructions (Signed)
Continue primidone, increasing the dose if needed to 50 mg at bedtime and the second 50 mg dose in the morning.  You may also experiment with a somewhat lower dose of 25 mg in the morning if you think the medication is causing drowsiness.    Continue to monitor your gait stability and balance and report any problems with falls.  Problems with control of your bladder or cognitive problems also need to be reported.  Any new symptoms would warrant further investigation for the possibility of normal pressure hydrocephalus (NPH) as we have discussed today.    I would also recommend additional cranial imaging with MRI scan to be performed with contrast if you have any new symptoms.  We will defer these studies for now at your request.    I recommend that you abstain from all alcohol use.  This may be an important contributing factor to gait unsteadiness you have had in the past, and wearing off of alcohol will cause your tremor to increase.    Return for follow-up in 3 months.

## 2015-10-09 NOTE — Progress Notes (Signed)
[facility]  Physician: Milus Mallick, MD   Phone: [phone]  Today's date: 10/09/2015       HPI/CHIEF COMPLAINT:  Ronnie Mason is a 65 year old man who returns for a follow up of orthostatic tremor, demyelinating lesions on brain MRI, lower back pain, bilateral foot paraesthesias, and syncope.     He continues to have a tremor in his extremities. Primidone 50mg  at bedtime has been beneficial until evening the next day. He is interested in increasing the dose after a trial of 1 tablet bid. No drowsiness or other medication side effects.  He does not think that the primidone has provided specific benefit for his gait stability.  He has not had any more syncopal symptoms.    He feels that his balance and gait is "a little bit" better by going to physical therapy sessions. He is concerned that he "wobbles" on the stationary bike.     He continues to have lower back pain. He is scheduled to see Dr. Araceli Bouche at [facility] pain clinic on 10/16/15.  He would like to have epidural steroid injections and "radiofrequency ablations" as he has had in the past.    No bowel or bladder problems. No urgency or incontinence. Memory is stable.     Holter monitor on 08/08/15 was unremarkable.     Couple glasses of wine per week.     REVIEW OF SYSTEMS:  Neurological: See HPI  GU: See history of present illness.  Psychiatry: See history of present illness.  Cardiology: See HPI    PHYSICAL EXAM:  Constitutional: This is a well-developed, pleasant male in no acute distress.  Filed Vitals:    10/09/15 0902 10/09/15 0907   BP:  158/102   Pulse: 86 114   Height: 6' 2.25" (1.886 m)    Weight: 211 lb (95.709 kg)      Mental Status: Alert and oriented, with intact attention, concentration, memory, language and fund of knowledge.  Cranial Nerves: Oculomotor intact. no facial weakness. No hearing loss. No dysarthria.  Facial and vocal tremor noted.  Motor: No abnormalities of tone, bulk or power in the limbs. There is no   incoordination in the limbs. Gait is mostly symmetrical.  The basis somewhat broad.  There is no shuffling.  There is no postural instability. Tremor, postural.    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  There is however scattered periventricular white matter changes ("demyelination") of a nonspecific nature.  Finally there appears to be hypoplasia (as opposed to atrophy?)  of the cerebellum affecting principally the midline.    ASSESSMENT/PLAN:     ICD-10-CM ICD-9-CM    1. Essential tremor G25.0 333.1 Primidone 50 MG Oral Tab   2. 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  He is severely claustrophobic and is fearful of having another study at this time.  It would be beneficial for him to have MR imaging without and with contrast, and sagittal FLAIR views in an effort to ensure that they white matter changes noted periventricularly do not represent inflammation.    Appearance  of the cerebellum upon review of the films today suggests the possibility of congenital brain anomalies including cerebellar hypoplasia and congenital ventriculomegaly.    Clinically, he has not clearly reporting gait deterioration, incontinence, or cognitive problems as might be anticipated with NPH.  His gait does not have a shuffling quality to it.  His sensory neuropathy and history of alcohol use, might also explain some of his gait instability.    He does not appear to have orthostatic tremor-his gait does not seem to have improved coincident with the use of primidone, or to correlate with the severity of his tremor.    Counseling  provided today regarding the "slippery slope" of resuming alcohol consumption.  His tremor will certainly worsen (not to mention his gait stability) with alcohol use and withdrawal.    Patient Instructions   Continue primidone, increasing the dose if needed to 50 mg at bedtime and the second 50 mg dose in the morning.  You may also experiment with a somewhat lower dose of 25 mg in the morning if you think the medication is causing drowsiness.    Continue to monitor your gait stability and balance and report any problems with falls.  Problems with control of your bladder or cognitive problems also need to be reported.  Any new symptoms would warrant further investigation for the possibility of normal pressure hydrocephalus (NPH) as we have discussed today.    I would also recommend additional cranial imaging with MRI scan to be performed with contrast if you have any new symptoms.  We will defer these studies for now at your request.    I recommend that you abstain from all alcohol use.  This may be an important contributing factor to gait unsteadiness you have had in the past, and wearing off of alcohol will cause your tremor to increase.    Return for follow-up in 3 months.    Prior EMR records reviewed as available and clinically relevant. All questions answered. Discharge and follow up instructions were discussed with the patient and/or accompanying persons.    LAB RESULTS:  Telephone on 09/06/15   1. LIPID PANEL   Result Value Ref Range    Cholesterol (Total) 253 (H) <200 mg/dL    Triglyceride 92 <161<150 mg/dL    Cholesterol (HDL) 096149 >39 mg/dL    Cholesterol (LDL) 86 <130 mg/dL    Non-HDL Cholesterol 104 0 - 159 mg/dL    Cholesterol/HDL Ratio 1.7     Lipid Panel, Additional Info. (NOTE)    2. HEMOGLOBIN A1C, HPLC   Result Value Ref Range    Hemoglobin A1C 5.0 4.0 - 6.0 %   3. COMPREHENSIVE METABOLIC PANEL   Result Value Ref Range    Sodium 138 135 - 145 meq/L    Potassium 5.1 3.6 - 5.2 meq/L    Chloride 102 98 -  108 meq/L    Carbon Dioxide, Total 26 22 - 32 meq/L    Anion Gap 10 4 - 12    Glucose 99 62 - 125 mg/dL    Urea Nitrogen 24 (H) 8 - 21 mg/dL    Creatinine 0.451.12 4.090.51 - 1.18 mg/dL    Protein (Total) 7.1 6.0 - 8.2 g/dL    Albumin 4.4 3.5 - 5.2 g/dL    Bilirubin (Total) 0.6 0.2 - 1.3 mg/dL    Calcium 9.6 8.9 - 81.110.2 mg/dL    AST (GOT) 26 9 - 38 U/L    Alkaline Phosphatase (Total) 86 37 - 159  U/L    ALT (GPT) 25 10 - 48 U/L    GFR, Calc, European American >60 mL/min/[1.73_m2]    GFR, Calc, African American >60 mL/min/[1.73_m2]    GFR, Information       Calculated GFR in mL/min/1.73 m2 by MDRD equation.  Inaccurate with changing renal function.  See http://depts.ThisTune.it.html       PROBLEM LIST:  Patient Active Problem List   Diagnosis    Syncope    Hypotension    Essential hypertension    Carotid Sinus Hypersensitivity    URI (upper respiratory infection)    Cervicalgia    Hyperlipidemia    Chronic pain    Bee sting allergy    Numbness and tingling of leg    Chronic back pain    B12 deficiency    Chronic renal insufficiency, stage III (moderate)    Essential tremor    Back pain, lumbosacral    Lumbar radiculopathy, chronic    Neck pain    Fall    Lumbosacral radiculopathy at S1    Tremor    Sensory neuropathy    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 myelopathy or radiculopathy    Idiopathic peripheral neuropathy    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)    History of alcohol dependence (HCC)    Cerebral ventriculomegaly       PAST MEDICAL HISTORY:  Past Medical History   Diagnosis Date    Syncope     HYPOTENSION      HYPERTENSION      Carotid Sinus Hypersensitivity     URI (upper respiratory infection)     History of EKG 5/97    Lipidemia     Chronic kidney disease     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 myelopathy or radiculopathy 05/28/2014       SURGICAL HISTORY:  Past Surgical History   Procedure Laterality Date    Unlisted procedure hands/fingers       Unlisted procedure femur/knee      Anes; colonoscopy  2002     repeat in 3 years    Anes; colonoscopy & polypectomy  11/18/2007     repeat in 3 years    Unlisted procedure spine  2013     rfa to c3 to c 7        MEDICATIONS:  Current Outpatient Prescriptions   Medication Sig Dispense Refill    Atorvastatin Calcium 10 MG Oral Tab Take 1 tablet (10 mg) by mouth daily. 90 tablet 1    Clobetasol Propionate 0.05 % External Ointment Small amount to rash as needed bid for 1-2 weeks at a time 15 g 1    Cyanocobalamin 1000 MCG Oral Tab one per day over-the-counter started 5/21 /2012 this level borderline low 1 Tab 1    Diclofenac Sodium (VOLTAREN) 1 % Transdermal Gel Apply 4 g topically 4 times a day. Apply to neck, trapezius muscle, and low back. No more than 4 g applied at a time. 1 Tube 5    DULoxetine HCl 60 MG Oral CAPSULE ENTERIC COATED PARTICLES Take 1 capsule (60 mg) by mouth daily. 90 capsule 1    EPINEPHrine 0.3 MG/0.3ML Injection Solution Auto-injector Inject as instructed per patient package insert, 0.3 mg intramuscularly or subcutaneously into the thigh, if needed to treat anaphylaxis 2 Autoinjector 0    Hydrocodone-Acetaminophen 5-325 MG Oral Tab 1-2 po in am as needed 60 tablet 0    [START ON 10/31/2015] Hydrocodone-Acetaminophen 5-325 MG Oral Tab 1- 2 in am  as needed 60 tablet 0    [START ON 11/30/2015] Hydrocodone-Acetaminophen 5-325 MG Oral Tab 1-2 po in am as needed 60 tablet 0    Ibuprofen 200 MG Oral Tab Take 1 tablet (200 mg) by mouth. Give with food.mid day as needed for neck and back pain      Lidocaine 5 % External Patch Apply 1 patch onto the skin daily. Apply to painful area for up to 12 hours in a 24 hour period. 30 patch 12    Lisinopril 40 MG Oral Tab TAKE ONE TABLET BY MOUTH ONCE DAILY 90 tablet 1    Primidone 50 MG Oral Tab Take 1 tablet (50 mg) by mouth 2 times a day. 60 tablet 3     No current facility-administered medications for this visit.       ALLERGIES:  Review of  patient's allergies indicates:  Allergies   Allergen Reactions    Bee Venom Hives, Itching and Swelling    Gabapentin      Flu like symptoms, diarrhea, body ache upset stomach    Lidocaine Rash     Lidocaine patch. PATIENT STATES HE IS ALLERGIC TO THE ADHESIVE NOT THE PATCH.       FAMILY HISTORY:  Family History   Problem Relation Age of Onset    Colon Cancer Father     Heart (other) Mother      MI    Other Family Hx       myelodysplasia       SOCIAL HISTORY:  Social History     Social History    Marital Status: Married     Spouse Name: N/A    Number of Children: N/A    Years of Education: N/A     Occupational History    Not on file.     Social History Main Topics    Smoking status: Never Smoker     Smokeless tobacco: Never Used    Alcohol Use: Yes    Drug Use: No    Sexual Activity: Not on file     Other Topics Concern    Not on file     Social History Narrative     Total time of greater than 40 minutes was spent of which greater than 50% was spent in coordination of care and counseling with the patient, as outlined in this note.    Portions of today's documentation have been created with the assistance of voice recognition software. Therefore, it may contain anomalous punctuation, inadvertent misrecognitions, word substitutions, insertions or omissions. Occasional wrong-word or sound-alike substitutions may also occur, all due to the inherent limitations of voice recognition software. Attempts to correct the above have been made by Dr. Miguel Dibble, but it is recommended that the chart be read carefully to recognize, using context, where these substitutions may have occurred. Please notify us if a serious error or discrepancy is present so that it may be corrected or if there is confusion regarding this document.    Portions of this chart were written by medical scribe, Loletha Grayer, with oversight by Dr. Liz Beach Kirschner on 10/09/2015 8:56 AM    I, Christean Grief, MD, interviewed and examined  the patient while overseeing the documentation performed by my Medical Scribe, Loletha Grayer. I have reviewed and revised as necessary the scribes note and agree with the documented findings and plan of care. Please refer to the scribe's note above for further detailed information regarding the patient encounter and exam.  10/09/2015.  10:57 AM

## 2015-10-12 ENCOUNTER — Other Ambulatory Visit (INDEPENDENT_AMBULATORY_CARE_PROVIDER_SITE_OTHER): Payer: Self-pay | Admitting: Cardiovascular Disease

## 2015-10-12 DIAGNOSIS — I10 Essential (primary) hypertension: Secondary | ICD-10-CM

## 2015-10-14 MED ORDER — LISINOPRIL 40 MG OR TABS
ORAL_TABLET | ORAL | Status: DC
Start: 2015-10-14 — End: 2015-12-17

## 2015-10-16 ENCOUNTER — Encounter (HOSPITAL_BASED_OUTPATIENT_CLINIC_OR_DEPARTMENT_OTHER): Payer: Self-pay | Admitting: Anesthesiology

## 2015-10-16 ENCOUNTER — Ambulatory Visit (HOSPITAL_BASED_OUTPATIENT_CLINIC_OR_DEPARTMENT_OTHER): Payer: PPO | Attending: Anesthesiology | Admitting: Anesthesiology

## 2015-10-16 VITALS — BP 144/104 | HR 112 | Temp 96.8°F | Ht 74.25 in | Wt 214.0 lb

## 2015-10-16 DIAGNOSIS — M545 Low back pain, unspecified: Secondary | ICD-10-CM

## 2015-10-16 DIAGNOSIS — G8929 Other chronic pain: Secondary | ICD-10-CM | POA: Insufficient documentation

## 2015-10-16 DIAGNOSIS — M47816 Spondylosis without myelopathy or radiculopathy, lumbar region: Secondary | ICD-10-CM

## 2015-10-16 DIAGNOSIS — M791 Myalgia: Secondary | ICD-10-CM

## 2015-10-16 DIAGNOSIS — M7918 Myalgia, other site: Secondary | ICD-10-CM

## 2015-10-16 DIAGNOSIS — M1288 Other specific arthropathies, not elsewhere classified, other specified site: Secondary | ICD-10-CM | POA: Insufficient documentation

## 2015-10-16 NOTE — Patient Instructions (Addendum)
Consider evaluation by PM&R physician/physiatrist    We will call you to schedule medial branch blocks

## 2015-10-16 NOTE — Progress Notes (Signed)
Ronnie Mason  10/16/2015   Ronnie Mason  [id number]    REFERRED BY:   No referring provider defined for this encounter.  PCP:  Ronnie CarlsVara V Kraft, MD (General)  [phone] / [place] [id number]    REFERRED FOR: Evaluation of low back and neck pain  with the following specific issues to be addressed:   CHIEF COMPLAINT:   Chief Complaint   Patient presents with   . Pain Management     Ronnie Mason pt c/o neck and back pain        HISTORY OF PRESENT ILLNESS:  [name] is a 65 year old male with chronic pain located in lower back pain, specifically left L4 through S1.  Patient reports that the pain is worse in the morning, characterized as a tight muscle, dull and achy.  He rates his baseline pain at 4-6/10.  Pain intermittently worsens with jerking or abrupt turning motions, causing 7-9/10 pain.  When aggravated, can cause nausea and pain lasts 3-4 days.  Low back pain improves with icing in the morning and with activity, prescribed by his PT.  Worse when seated for greater than 1 hour.  Pain does not interrupt Ronnie Mason' sleep, never woken him from sleep.     Ronnie Mason also has left sided neck pain, improved greatly after "injections" by Dr. Amada Mason, 2 years prior.  States that he has some mild numbness in his left trapezius.  Per chart review I believe he is referring to IMS.    Ronnie Mason endorses taking duloxetine 60 mg qD, hydrocodone-APAP 5-325mg  BID, ibuprofen 200mg  qD, APAP 500mg  qD, diclofenac sodium transdermal gel, and lidocaine 5% patch.      Review of Systems:  1. Constitutional: Negative   2. Cardiovascular: Negative   3. Respiratory: Negative   4. MSK: As noted in HPI  5. Neuro: Denies lower extremity weakness      PAST HISTORY:  Past Medical History   Diagnosis Date   . Syncope    . HYPOTENSION     . HYPERTENSION     . Carotid Sinus Hypersensitivity    . URI (upper respiratory infection)    . History of EKG 5/97   . Lipidemia    . Chronic kidney disease     . 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 myelopathy or radiculopathy 05/28/2014     Past Surgical History   Procedure Laterality Date   . Unlisted procedure hands/fingers     . Unlisted procedure femur/knee     . Anes; colonoscopy  2002     repeat in 3 years   . Anes; colonoscopy & polypectomy  11/18/2007     repeat in 3 years   . Unlisted procedure spine  2013     rfa to c3 to c 7      Family History   Problem Relation Age of Onset   . Colon Cancer Father    . Heart (other) Mother      MI   . Other Family Hx       myelodysplasia     Social History     Social History Narrative       Allergies: Bee venom; Gabapentin; and Lidocaine    Physical Exam:  General:  NAD, seated comfortably in chair  Respiratory:  Speaking in full sentences.   Neuro: strength 5/5 in lower extremities in both proximal and distal muscle groups.Sensation intact throughout all major dermatomes  MSK:  Pain @ L4-S1 with loading and extension (facet loading significantly positive on left), Lumbar flexion and extension intact    Past medical, surgical, family, and social history as above was reviewed and updated personally with [name].  Medications and allergies also reviewed and confirmed personally.    Assessment/Plan:  [name] is a 65 year old male with chronic lower back pain, specifically at L4 through S1.  Pain is intermittent and is aggravated by abrupt turning, twisting, and jerking motions.  Patient is seeking improvement from his baseline pain, however this may not be possible.  He previously had an RFA procedure done in 2013 without any documented results therefore I would am willing to repeat a medial branch block to see if he may benefit.  Patient advised to consult his primary provider for a referral to physiatry for evaluation to and possible changes to his PT regimen.  He will also require more intensive PT after RFA if it is indicated.     -clinic will schedule medial branch block targeted to Left L3-L4,  L4-5 and L5-S1  facet joints. (L3 was not previously targeted on prior RFA but may be worthwhile based on exam and imaging)  -patient to follow-up with PCP for referral to physiatry  -continue current treatment regimen until follow-up

## 2015-11-04 ENCOUNTER — Other Ambulatory Visit (INDEPENDENT_AMBULATORY_CARE_PROVIDER_SITE_OTHER): Payer: Self-pay | Admitting: Internal Medicine

## 2015-11-04 DIAGNOSIS — M542 Cervicalgia: Secondary | ICD-10-CM

## 2015-11-05 ENCOUNTER — Encounter: Payer: Self-pay | Admitting: Internal Medicine

## 2015-11-05 DIAGNOSIS — M542 Cervicalgia: Secondary | ICD-10-CM

## 2015-11-05 MED ORDER — DICLOFENAC SODIUM 1 % TD GEL
4.0000 g | Freq: Four times a day (QID) | TRANSDERMAL | Status: DC
Start: 2015-11-05 — End: 2016-11-09

## 2015-11-05 NOTE — Telephone Encounter (Signed)
The pharmacy did not provide last refill date

## 2015-11-08 ENCOUNTER — Ambulatory Visit (HOSPITAL_BASED_OUTPATIENT_CLINIC_OR_DEPARTMENT_OTHER): Payer: PPO | Attending: Anesthesiology | Admitting: Anesthesiology

## 2015-11-08 DIAGNOSIS — M4686 Other specified inflammatory spondylopathies, lumbar region: Secondary | ICD-10-CM

## 2015-11-08 DIAGNOSIS — M545 Low back pain: Secondary | ICD-10-CM

## 2015-11-25 ENCOUNTER — Other Ambulatory Visit: Payer: Self-pay | Admitting: Internal Medicine

## 2015-11-25 DIAGNOSIS — M5417 Radiculopathy, lumbosacral region: Secondary | ICD-10-CM

## 2015-11-25 DIAGNOSIS — M542 Cervicalgia: Secondary | ICD-10-CM

## 2015-11-25 NOTE — Telephone Encounter (Signed)
The patient last received this medication at the requesting pharmacy on 05/07/15

## 2015-11-26 MED ORDER — LIDOCAINE 5 % EX PTCH
1.0000 | MEDICATED_PATCH | Freq: Every day | CUTANEOUS | Status: DC
Start: 2015-11-26 — End: 2016-12-30

## 2015-11-30 ENCOUNTER — Inpatient Hospital Stay (HOSPITAL_BASED_OUTPATIENT_CLINIC_OR_DEPARTMENT_OTHER)
Admission: EM | Admit: 2015-11-30 | Discharge: 2015-12-10 | DRG: 064 | Disposition: A | Discharge status: Home / Self Care | Payer: PPO | Attending: Neurology | Admitting: Neurology

## 2015-11-30 ENCOUNTER — Other Ambulatory Visit: Payer: Self-pay | Admitting: Emergency Medicine

## 2015-11-30 ENCOUNTER — Inpatient Hospital Stay (HOSPITAL_COMMUNITY): Payer: PPO | Admitting: Neurology

## 2015-11-30 DIAGNOSIS — S298XXA Other specified injuries of thorax, initial encounter: Secondary | ICD-10-CM

## 2015-11-30 DIAGNOSIS — M542 Cervicalgia: Secondary | ICD-10-CM | POA: Diagnosis present

## 2015-11-30 DIAGNOSIS — S3993XA Unspecified injury of pelvis, initial encounter: Secondary | ICD-10-CM

## 2015-11-30 DIAGNOSIS — S3992XA Unspecified injury of lower back, initial encounter: Secondary | ICD-10-CM

## 2015-11-30 DIAGNOSIS — Y908 Blood alcohol level of 240 mg/100 ml or more: Secondary | ICD-10-CM | POA: Diagnosis present

## 2015-11-30 DIAGNOSIS — S022XXA Fracture of nasal bones, initial encounter for closed fracture: Secondary | ICD-10-CM | POA: Diagnosis present

## 2015-11-30 DIAGNOSIS — G8191 Hemiplegia, unspecified affecting right dominant side: Secondary | ICD-10-CM | POA: Diagnosis present

## 2015-11-30 DIAGNOSIS — W19XXXA Unspecified fall, initial encounter: Secondary | ICD-10-CM

## 2015-11-30 DIAGNOSIS — S01511A Laceration without foreign body of lip, initial encounter: Secondary | ICD-10-CM

## 2015-11-30 DIAGNOSIS — I618 Other nontraumatic intracerebral hemorrhage: Secondary | ICD-10-CM

## 2015-11-30 DIAGNOSIS — S066X9A Traumatic subarachnoid hemorrhage with loss of consciousness of unspecified duration, initial encounter: Secondary | ICD-10-CM | POA: Diagnosis present

## 2015-11-30 DIAGNOSIS — S3991XA Unspecified injury of abdomen, initial encounter: Secondary | ICD-10-CM

## 2015-11-30 DIAGNOSIS — S199XXA Unspecified injury of neck, initial encounter: Secondary | ICD-10-CM

## 2015-11-30 DIAGNOSIS — S299XXA Unspecified injury of thorax, initial encounter: Secondary | ICD-10-CM

## 2015-11-30 DIAGNOSIS — S0181XA Laceration without foreign body of other part of head, initial encounter: Secondary | ICD-10-CM | POA: Diagnosis present

## 2015-11-30 DIAGNOSIS — W1830XA Fall on same level, unspecified, initial encounter: Secondary | ICD-10-CM

## 2015-11-30 DIAGNOSIS — Y9289 Other specified places as the place of occurrence of the external cause: Secondary | ICD-10-CM

## 2015-11-30 DIAGNOSIS — G8929 Other chronic pain: Secondary | ICD-10-CM | POA: Diagnosis present

## 2015-11-30 DIAGNOSIS — N189 Chronic kidney disease, unspecified: Secondary | ICD-10-CM | POA: Diagnosis present

## 2015-11-30 DIAGNOSIS — I10 Essential (primary) hypertension: Secondary | ICD-10-CM | POA: Diagnosis present

## 2015-11-30 DIAGNOSIS — I619 Nontraumatic intracerebral hemorrhage, unspecified: Secondary | ICD-10-CM

## 2015-11-30 DIAGNOSIS — I129 Hypertensive chronic kidney disease with stage 1 through stage 4 chronic kidney disease, or unspecified chronic kidney disease: Secondary | ICD-10-CM | POA: Diagnosis present

## 2015-11-30 DIAGNOSIS — E876 Hypokalemia: Secondary | ICD-10-CM | POA: Diagnosis not present

## 2015-11-30 DIAGNOSIS — R402431 Glasgow coma scale score 3-8, in the field [EMT or ambulance]: Secondary | ICD-10-CM | POA: Diagnosis present

## 2015-11-30 DIAGNOSIS — G250 Essential tremor: Secondary | ICD-10-CM | POA: Diagnosis present

## 2015-11-30 DIAGNOSIS — G629 Polyneuropathy, unspecified: Secondary | ICD-10-CM | POA: Diagnosis present

## 2015-11-30 DIAGNOSIS — M545 Low back pain: Secondary | ICD-10-CM | POA: Diagnosis present

## 2015-11-30 DIAGNOSIS — Z9181 History of falling: Secondary | ICD-10-CM

## 2015-11-30 DIAGNOSIS — M4182 Other forms of scoliosis, cervical region: Secondary | ICD-10-CM

## 2015-11-30 DIAGNOSIS — R000 Tachycardia, unspecified: Secondary | ICD-10-CM

## 2015-11-30 DIAGNOSIS — F10229 Alcohol dependence with intoxication, unspecified: Secondary | ICD-10-CM | POA: Diagnosis present

## 2015-11-30 DIAGNOSIS — F10239 Alcohol dependence with withdrawal, unspecified: Secondary | ICD-10-CM | POA: Diagnosis present

## 2015-11-30 DIAGNOSIS — S065X0A Traumatic subdural hemorrhage without loss of consciousness, initial encounter: Secondary | ICD-10-CM

## 2015-11-30 DIAGNOSIS — I951 Orthostatic hypotension: Secondary | ICD-10-CM | POA: Diagnosis present

## 2015-11-30 DIAGNOSIS — R32 Unspecified urinary incontinence: Secondary | ICD-10-CM | POA: Diagnosis not present

## 2015-11-30 LAB — CK, CREATINE KINASE, TOTAL ACTIVITY: Creatine Kinase Total Activity: 87 U/L (ref 62–325)

## 2015-11-30 LAB — STANDARD DRUG SCREEN, URN
Acetaminophen Qualitative, URN: POSITIVE — AB
Alcohol (Ethyl), URN: 316 mg/dL — AB
Amphet/Methamphetamine Qual,URN: NEGATIVE
Barbiturate (Qual), URN: POSITIVE — AB
Benzodiazepines (Qual), URN: NEGATIVE
Cannabinoids (Qual), URN: NEGATIVE
Cocaine (Qual), URN: NEGATIVE
Methadone (Qual), URN: NEGATIVE
Opiates (Qual), URN: POSITIVE — AB
Phencyclidine (Qual), URN: NEGATIVE
Tricyclic Antidepressants, URN: NEGATIVE

## 2015-11-30 LAB — URINALYSIS COMPLETE, URN
Bacteria, URN: NONE SEEN
Bilirubin (Qual), URN: NEGATIVE
Epith Cells_Renal/Trans,URN: NEGATIVE /HPF
Epith Cells_Squamous, URN: NEGATIVE /LPF
Glucose Qual, URN: NEGATIVE mg/dL
Ketones, URN: NEGATIVE mg/dL
Leukocyte Esterase, URN: NEGATIVE
Nitrite, URN: NEGATIVE
Occult Blood, URN: NEGATIVE
Protein (Alb Semiquant), URN: NEGATIVE mg/dL
Specific Gravity, URN: 1.029 g/mL — ABNORMAL HIGH (ref 1.002–1.027)
WBC, URN: NEGATIVE /HPF
pH, URN: 6 (ref 5.0–8.0)

## 2015-11-30 LAB — TRAUMA PANEL
Alcohol (Ethyl): 324 mg/dL — AB
Anion Gap: 8 (ref 4–12)
Calcium: 8.9 mg/dL (ref 8.9–10.2)
Carbon Dioxide, Total: 27 meq/L (ref 22–32)
Chloride: 103 meq/L (ref 98–108)
Creatinine: 1.01 mg/dL (ref 0.51–1.18)
GFR, Calc, African American: >60 mL/min/{1.73_m2}
GFR, Calc, European American: >60 mL/min/{1.73_m2}
Glucose: 104 mg/dL (ref 62–125)
Hematocrit: 43 % (ref 38–50)
Hemoglobin: 14.6 g/dL (ref 13.0–18.0)
Lipase: 53 U/L (ref <70)
MCH: 34.4 pg — ABNORMAL HIGH (ref 27.3–33.6)
MCHC: 34.1 g/dL (ref 32.2–36.5)
MCV: 101 fL — ABNORMAL HIGH (ref 81–98)
Partial Thromboplastin Time: 28 s (ref 22–35)
Platelet Count: 199 10*3/uL (ref 150–400)
Potassium: 4.5 meq/L (ref 3.6–5.2)
Prothrombin INR: 0.9 (ref 0.8–1.3)
Prothrombin Time Patient: 11.7 s (ref 10.7–15.6)
RBC: 4.25 10*6/uL — ABNORMAL LOW (ref 4.40–5.60)
RDW-CV: 13.5 % (ref 11.6–14.4)
Sodium: 138 meq/L (ref 135–145)
Urea Nitrogen: 26 mg/dL — ABNORMAL HIGH (ref 8–21)
WBC: 5.25 10*3/uL (ref 4.30–10.00)

## 2015-12-01 ENCOUNTER — Encounter (HOSPITAL_BASED_OUTPATIENT_CLINIC_OR_DEPARTMENT_OTHER): Payer: Self-pay | Admitting: Neurology

## 2015-12-01 ENCOUNTER — Other Ambulatory Visit: Payer: Self-pay | Admitting: Neurology

## 2015-12-01 ENCOUNTER — Other Ambulatory Visit: Payer: Self-pay | Admitting: Nurse Practitioner

## 2015-12-01 DIAGNOSIS — I618 Other nontraumatic intracerebral hemorrhage: Secondary | ICD-10-CM

## 2015-12-01 DIAGNOSIS — S0993XA Unspecified injury of face, initial encounter: Secondary | ICD-10-CM

## 2015-12-01 DIAGNOSIS — S065X0A Traumatic subdural hemorrhage without loss of consciousness, initial encounter: Secondary | ICD-10-CM

## 2015-12-01 DIAGNOSIS — W19XXXA Unspecified fall, initial encounter: Secondary | ICD-10-CM

## 2015-12-01 DIAGNOSIS — F1010 Alcohol abuse, uncomplicated: Secondary | ICD-10-CM

## 2015-12-01 DIAGNOSIS — S066X0A Traumatic subarachnoid hemorrhage without loss of consciousness, initial encounter: Secondary | ICD-10-CM

## 2015-12-01 DIAGNOSIS — R296 Repeated falls: Secondary | ICD-10-CM

## 2015-12-01 DIAGNOSIS — I619 Nontraumatic intracerebral hemorrhage, unspecified: Secondary | ICD-10-CM

## 2015-12-01 LAB — BASIC METABOLIC PANEL
Anion Gap: 17 — ABNORMAL HIGH (ref 4–12)
Calcium: 8.8 mg/dL — ABNORMAL LOW (ref 8.9–10.2)
Carbon Dioxide, Total: 20 meq/L — ABNORMAL LOW (ref 22–32)
Chloride: 102 meq/L (ref 98–108)
Creatinine: 0.94 mg/dL (ref 0.51–1.18)
GFR, Calc, African American: >60 mL/min/{1.73_m2}
GFR, Calc, European American: >60 mL/min/{1.73_m2}
Glucose: 115 mg/dL (ref 62–125)
Potassium: 4.3 meq/L (ref 3.6–5.2)
Sodium: 139 meq/L (ref 135–145)
Urea Nitrogen: 21 mg/dL (ref 8–21)

## 2015-12-01 LAB — CBC (HEMOGRAM)
Hematocrit: 41 % (ref 38–50)
Hemoglobin: 13.9 g/dL (ref 13.0–18.0)
MCH: 34.5 pg — ABNORMAL HIGH (ref 27.3–33.6)
MCHC: 34.2 g/dL (ref 32.2–36.5)
MCV: 101 fL — ABNORMAL HIGH (ref 81–98)
Platelet Count: 176 10*3/uL (ref 150–400)
RBC: 4.03 10*6/uL — ABNORMAL LOW (ref 4.40–5.60)
RDW-CV: 13.6 % (ref 11.6–14.4)
WBC: 10.05 10*3/uL — ABNORMAL HIGH (ref 4.30–10.00)

## 2015-12-01 LAB — PROTHROMBIN & PTT
Partial Thromboplastin Time: 27 s (ref 22–35)
Prothrombin INR: 1 (ref 0.8–1.3)
Prothrombin Time Patient: 12.6 s (ref 10.7–15.6)

## 2015-12-01 LAB — MAGNESIUM: Magnesium: 1.7 mg/dL — ABNORMAL LOW (ref 1.8–2.4)

## 2015-12-02 ENCOUNTER — Telehealth: Payer: Self-pay | Admitting: Anesthesiology

## 2015-12-02 DIAGNOSIS — R32 Unspecified urinary incontinence: Secondary | ICD-10-CM

## 2015-12-02 LAB — POTASSIUM, WBLD: Potassium: 3.6 meq/L — ABNORMAL LOW (ref 3.7–5.2)

## 2015-12-02 LAB — CBC (HEMOGRAM)
Hematocrit: 38 % (ref 38–50)
Hemoglobin: 12.7 g/dL — ABNORMAL LOW (ref 13.0–18.0)
MCH: 34.5 pg — ABNORMAL HIGH (ref 27.3–33.6)
MCHC: 33.8 g/dL (ref 32.2–36.5)
MCV: 102 fL — ABNORMAL HIGH (ref 81–98)
Platelet Count: 165 10*3/uL (ref 150–400)
RBC: 3.68 10*6/uL — ABNORMAL LOW (ref 4.40–5.60)
RDW-CV: 13.8 % (ref 11.6–14.4)
WBC: 5.38 10*3/uL (ref 4.30–10.00)

## 2015-12-02 LAB — MAGNESIUM: Magnesium: 1.9 mg/dL (ref 1.8–2.4)

## 2015-12-02 LAB — BASIC METABOLIC PANEL
Anion Gap: 8 (ref 4–12)
Calcium: 8.7 mg/dL — ABNORMAL LOW (ref 8.9–10.2)
Carbon Dioxide, Total: 28 meq/L (ref 22–32)
Chloride: 101 meq/L (ref 98–108)
Creatinine: 0.79 mg/dL (ref 0.51–1.18)
GFR, Calc, African American: >60 mL/min/{1.73_m2}
GFR, Calc, European American: >60 mL/min/{1.73_m2}
Glucose: 115 mg/dL (ref 62–125)
Potassium: 3.4 meq/L — ABNORMAL LOW (ref 3.6–5.2)
Sodium: 137 meq/L (ref 135–145)
Urea Nitrogen: 18 mg/dL (ref 8–21)

## 2015-12-02 LAB — VANCO-RESIST ENTEROCOCCUS C/S

## 2015-12-02 LAB — R/O MRSA

## 2015-12-02 LAB — LAB ADD ON ORDER

## 2015-12-02 LAB — IMMEDIATE CARE HGB AND HCT
Hematocrit: 42 % (ref 38–50)
Hemoglobin: 13.6 g/dL (ref 13.0–18.0)

## 2015-12-02 NOTE — Telephone Encounter (Signed)
Routing to procedure scheduler

## 2015-12-02 NOTE — Telephone Encounter (Signed)
(  TEXTING IS AN OPTION FOR [facility] CLINICS ONLY)  Is this a [facility] clinic? No      RETURN CALL: No call back needed      SUBJECT:  Cancellation/Reschedule Notification     ORIGINAL APPOINTMENT DATE: 12-04-15, TIME: 11 am  REASON: conflict with time  RESCHEDULED: NO

## 2015-12-03 LAB — BASIC METABOLIC PANEL
Anion Gap: 10 (ref 4–12)
Calcium: 9.1 mg/dL (ref 8.9–10.2)
Carbon Dioxide, Total: 28 meq/L (ref 22–32)
Chloride: 99 meq/L (ref 98–108)
Creatinine: 0.81 mg/dL (ref 0.51–1.18)
GFR, Calc, African American: >60 mL/min/{1.73_m2}
GFR, Calc, European American: >60 mL/min/{1.73_m2}
Glucose: 139 mg/dL — ABNORMAL HIGH (ref 62–125)
Potassium: 3.3 meq/L — ABNORMAL LOW (ref 3.6–5.2)
Sodium: 137 meq/L (ref 135–145)
Urea Nitrogen: 14 mg/dL (ref 8–21)

## 2015-12-03 LAB — R/O ACINETOBACTER CULTURE

## 2015-12-03 LAB — CBC (HEMOGRAM)
Hematocrit: 40 % (ref 38–50)
Hemoglobin: 13.5 g/dL (ref 13.0–18.0)
MCH: 34.1 pg — ABNORMAL HIGH (ref 27.3–33.6)
MCHC: 33.8 g/dL (ref 32.2–36.5)
MCV: 101 fL — ABNORMAL HIGH (ref 81–98)
Platelet Count: 157 10*3/uL (ref 150–400)
RBC: 3.96 10*6/uL — ABNORMAL LOW (ref 4.40–5.60)
RDW-CV: 13.5 % (ref 11.6–14.4)
WBC: 6.22 10*3/uL (ref 4.30–10.00)

## 2015-12-04 ENCOUNTER — Ambulatory Visit: Payer: PPO | Admitting: Anesthesiology

## 2015-12-04 ENCOUNTER — Other Ambulatory Visit: Payer: Self-pay | Admitting: Pediatrics

## 2015-12-04 DIAGNOSIS — L539 Erythematous condition, unspecified: Secondary | ICD-10-CM

## 2015-12-04 DIAGNOSIS — M7989 Other specified soft tissue disorders: Secondary | ICD-10-CM

## 2015-12-04 DIAGNOSIS — M79671 Pain in right foot: Secondary | ICD-10-CM

## 2015-12-04 LAB — CBC (HEMOGRAM)
Hematocrit: 41 % (ref 38–50)
Hemoglobin: 14.1 g/dL (ref 13.0–18.0)
MCH: 34.3 pg — ABNORMAL HIGH (ref 27.3–33.6)
MCHC: 34.1 g/dL (ref 32.2–36.5)
MCV: 101 fL — ABNORMAL HIGH (ref 81–98)
Platelet Count: 199 10*3/uL (ref 150–400)
RBC: 4.11 10*6/uL — ABNORMAL LOW (ref 4.40–5.60)
RDW-CV: 13.3 % (ref 11.6–14.4)
WBC: 6.16 10*3/uL (ref 4.30–10.00)

## 2015-12-04 LAB — BASIC METABOLIC PANEL
Anion Gap: 9 (ref 4–12)
Calcium: 9.4 mg/dL (ref 8.9–10.2)
Carbon Dioxide, Total: 29 meq/L (ref 22–32)
Chloride: 98 meq/L (ref 98–108)
Creatinine: 0.98 mg/dL (ref 0.51–1.18)
GFR, Calc, African American: >60 mL/min/{1.73_m2}
GFR, Calc, European American: >60 mL/min/{1.73_m2}
Glucose: 121 mg/dL (ref 62–125)
Potassium: 3.6 meq/L (ref 3.6–5.2)
Sodium: 136 meq/L (ref 135–145)
Urea Nitrogen: 17 mg/dL (ref 8–21)

## 2015-12-04 NOTE — Telephone Encounter (Signed)
Left a voice mail message for pt to call to reschedule procedure.

## 2015-12-05 DIAGNOSIS — F10239 Alcohol dependence with withdrawal, unspecified: Secondary | ICD-10-CM

## 2015-12-05 LAB — BASIC METABOLIC PANEL
Anion Gap: 10 (ref 4–12)
Calcium: 9.3 mg/dL (ref 8.9–10.2)
Carbon Dioxide, Total: 26 meq/L (ref 22–32)
Chloride: 100 meq/L (ref 98–108)
Creatinine: 1.02 mg/dL (ref 0.51–1.18)
GFR, Calc, African American: >60 mL/min/{1.73_m2}
GFR, Calc, European American: >60 mL/min/{1.73_m2}
Glucose: 114 mg/dL (ref 62–125)
Potassium: 3.6 meq/L (ref 3.6–5.2)
Sodium: 136 meq/L (ref 135–145)
Urea Nitrogen: 18 mg/dL (ref 8–21)

## 2015-12-05 LAB — CBC (HEMOGRAM)
Hematocrit: 41 % (ref 38–50)
Hemoglobin: 13.7 g/dL (ref 13.0–18.0)
MCH: 34 pg — ABNORMAL HIGH (ref 27.3–33.6)
MCHC: 33.8 g/dL (ref 32.2–36.5)
MCV: 101 fL — ABNORMAL HIGH (ref 81–98)
Platelet Count: 230 10*3/uL (ref 150–400)
RBC: 4.03 10*6/uL — ABNORMAL LOW (ref 4.40–5.60)
RDW-CV: 13.4 % (ref 11.6–14.4)
WBC: 7.69 10*3/uL (ref 4.30–10.00)

## 2015-12-06 DIAGNOSIS — R4189 Other symptoms and signs involving cognitive functions and awareness: Secondary | ICD-10-CM

## 2015-12-06 DIAGNOSIS — R278 Other lack of coordination: Secondary | ICD-10-CM

## 2015-12-06 DIAGNOSIS — S06890S Other specified intracranial injury without loss of consciousness, sequela: Secondary | ICD-10-CM

## 2015-12-06 LAB — BASIC METABOLIC PANEL
Anion Gap: 8 (ref 4–12)
Calcium: 9.7 mg/dL (ref 8.9–10.2)
Carbon Dioxide, Total: 28 meq/L (ref 22–32)
Chloride: 99 meq/L (ref 98–108)
Creatinine: 1.05 mg/dL (ref 0.51–1.18)
GFR, Calc, African American: >60 mL/min/{1.73_m2}
GFR, Calc, European American: >60 mL/min/{1.73_m2}
Glucose: 161 mg/dL — ABNORMAL HIGH (ref 62–125)
Potassium: 4 meq/L (ref 3.6–5.2)
Sodium: 135 meq/L (ref 135–145)
Urea Nitrogen: 20 mg/dL (ref 8–21)

## 2015-12-06 LAB — CBC (HEMOGRAM)
Hematocrit: 42 % (ref 38–50)
Hemoglobin: 14.1 g/dL (ref 13.0–18.0)
MCH: 34.3 pg — ABNORMAL HIGH (ref 27.3–33.6)
MCHC: 33.6 g/dL (ref 32.2–36.5)
MCV: 102 fL — ABNORMAL HIGH (ref 81–98)
Platelet Count: 237 10*3/uL (ref 150–400)
RBC: 4.11 10*6/uL — ABNORMAL LOW (ref 4.40–5.60)
RDW-CV: 13.5 % (ref 11.6–14.4)
WBC: 6.44 10*3/uL (ref 4.30–10.00)

## 2015-12-06 LAB — VITAMIN B12 (COBALAMIN): Vitamin B12 (Cobalamin): 837 pg/mL (ref 180–914)

## 2015-12-06 LAB — TSH WITH REFLEXIVE FREE T4: TSH with Reflexive Free T4: 1.835 u[IU]/mL (ref 0.400–5.000)

## 2015-12-07 LAB — URINALYSIS WITH REFLEX CULTURE
Bacteria, URN: NONE SEEN
Bilirubin (Qual), URN: NEGATIVE
Cast_Hyaline, URN: <1 /LPF — AB
Epith Cells_Renal/Trans,URN: NEGATIVE /HPF
Epith Cells_Squamous, URN: NEGATIVE /LPF
Glucose Qual, URN: NEGATIVE mg/dL
Ketones, URN: NEGATIVE mg/dL
Leukocyte Esterase, URN: NEGATIVE
Nitrite, URN: NEGATIVE
Specific Gravity, URN: 1.016 g/mL (ref 1.002–1.027)
WBC, URN: NEGATIVE /HPF

## 2015-12-07 LAB — REFLEX CULTURE FOR UA

## 2015-12-08 DIAGNOSIS — I10 Essential (primary) hypertension: Secondary | ICD-10-CM

## 2015-12-08 LAB — URINE C/S: Culture: NO GROWTH

## 2015-12-09 ENCOUNTER — Inpatient Hospital Stay (HOSPITAL_COMMUNITY): Admission: RE | Admit: 2015-12-09 | Payer: PPO | Source: Intra-hospital

## 2015-12-10 ENCOUNTER — Telehealth (HOSPITAL_BASED_OUTPATIENT_CLINIC_OR_DEPARTMENT_OTHER): Payer: Self-pay | Admitting: Neurology

## 2015-12-10 ENCOUNTER — Other Ambulatory Visit (HOSPITAL_BASED_OUTPATIENT_CLINIC_OR_DEPARTMENT_OTHER): Payer: Self-pay | Admitting: Neurology

## 2015-12-10 ENCOUNTER — Telehealth: Payer: Self-pay | Admitting: Rehabilitative and Restorative Service Providers"

## 2015-12-10 DIAGNOSIS — I610 Nontraumatic intracerebral hemorrhage in hemisphere, subcortical: Secondary | ICD-10-CM

## 2015-12-10 NOTE — Telephone Encounter (Signed)
Inpatient Discharge Follow-up Appointment  Notification ONLY, no response needed.     Name: Ronnie GranaDavid Paul Mason  MRN: [id number]  Inpatient Floor: 3E   Anticipated Discharge date: 12/10/15  Disposition: Inpt Rehab    Per Computerized Physician Order Entry (CPOE)  Diagnosis: Nontraumatic intracerebral hemorrhage, unspecified; Alcohol dependence with withdrawal, unspecified; Alcohol dependence with withdrawal, unspecified  Provider: Fontaine NoWax MD, Victorino DikeJennifer  Follow Up & Time Frame: follow up with PT      For more information please see the Discharge Readiness Tab in [facility].      Thank you,  Elizbeth SquiresKaohle Vang, CNT CTR Rep  Scheduling Liaison   Complex Appointing Team  [facility]

## 2015-12-10 NOTE — Telephone Encounter (Signed)
Inpatient Discharge Follow-up Appointment  Notification ONLY, no response needed.     Name: Ronnie GranaDavid Paul Raine  MRN: [id number]  Inpatient Floor: 3E  Anticipated Discharge date: 12/10/15  [facility]    Per Computerized Physician Order Entry (CPOE)  Diagnosis: Nontraumatic intracerebral hemorrhage, unspecified; Alcohol dependence with withdrawal, unspecified; Alcohol dependence with withdrawal, unspecified    Provider: Fontaine NoWax MD, Victorino DikeJennifer    Follow Up & Time Frame: 2 to 5 weeks with Dr. Chrissie NoaWilliam Evaluate for NPH        For more information please see the Discharge Readiness Tab in [facility].      Thank you,  Elizbeth SquiresKaohle Vang, CNT CTR Rep  Scheduling Liaison   Complex Appointing Team  [facility]

## 2015-12-11 ENCOUNTER — Telehealth (INDEPENDENT_AMBULATORY_CARE_PROVIDER_SITE_OTHER): Payer: Self-pay | Admitting: Internal Medicine

## 2015-12-11 NOTE — Telephone Encounter (Signed)
Curry d/c yesterday  Please call him today or tomorrow   Follow up early next week with me?  Home health pt /ot? Set up?    Please have wife come to visit     DISCHARGE MEDICATIONS:   1. amLODIPine 10 mg oral tablet Dose: 10 mg PO Daily   2. atorvastatin 10 mg oral tablet Dose: 10 mg PO Daily   3. DULoxetine 60 mg oral delayed release capsule Dose: 60 mg PO Daily   4. lisinopril 20 mg oral tablet Dose: 20 mg PO Q12 Hours   5. primidone 50 mg oral tablet Dose: 50 mg PO BID   6. thiamine 100 mg oral tablet Dose: 100 mg PO Daily   7. melatonin 3 mg oral tablet Dose: 6 mg PO QHS PRN Insomnia     DISCHARGE DIAGNOSIS:   Non-traumatic intraparenchymal hemorrhage, hypertensive of the left thalamus   Traumatic hemorrhage   -- falcine subdural hemorrhage, small cortical subarachnoid hemorrhage   Hypertension   Alchol withdrawal   Ventriculomegaly   Facial laceration   Unspecified fracture of facial bones

## 2015-12-11 NOTE — Telephone Encounter (Signed)
----- Message from Angelique Holm sent at 12/10/2015  4:10 PM PDT -----  Regarding: Discharge   [phone])  Encounter Date: 11/30/15                          Discharge Notification                12/10/2015  --------------------------------------------------------------------------------  Patient: Ronnie Mason, Ronnie Mason ([id number])                   Sex: Male                  DOB: 25-Jan-1951 (64 years)                                                  Address: [address]                                                               [place], [place] [address]                                                   Hosp Account: [id number]                                                       Discharge Date: Dec 10, 2015 1610                                                         Unit: [facility]                                                                     Room: E306-                                                                     Bed: G956-21  Patient Class: Inpatient                                                             Service: H NEUROLOGY S                                                  Admission Type: Emergency                                                                 PCP: Shann MedalKraft, Karalina Tift Viseskul, MD                                       Admitting Prov: Valerie RoysZunt, Joseph R, MD                                                    Ref Phy:             Phone:           Service: H NEUROLOGY S                                                     Disposition: 01 HOME/SELF CARE                                                 Destination:        [phone](   [id number])  Encounter Date: 11/30/15

## 2015-12-11 NOTE — Telephone Encounter (Signed)
noted 

## 2015-12-11 NOTE — Telephone Encounter (Signed)
(  TEXTING IS AN OPTION FOR [facility] CLINICS ONLY)  Is this a [facility] clinic? No      RETURN CALL: No call back needed      SUBJECT:  General Message     REASON FOR REQUEST: Beginning PT with patient on May 15.    MESSAGE: Tammy wanted to advise Dr. Judi SaaVara Kraft. Thank you.

## 2015-12-12 NOTE — Telephone Encounter (Signed)
Scheduled a follow up Monday.

## 2015-12-16 ENCOUNTER — Ambulatory Visit (INDEPENDENT_AMBULATORY_CARE_PROVIDER_SITE_OTHER): Payer: PPO | Admitting: Internal Medicine

## 2015-12-16 ENCOUNTER — Telehealth (INDEPENDENT_AMBULATORY_CARE_PROVIDER_SITE_OTHER): Payer: Self-pay | Admitting: Internal Medicine

## 2015-12-16 ENCOUNTER — Encounter (INDEPENDENT_AMBULATORY_CARE_PROVIDER_SITE_OTHER): Payer: Self-pay | Admitting: Internal Medicine

## 2015-12-16 VITALS — BP 126/78 | HR 72 | Wt 211.4 lb

## 2015-12-16 DIAGNOSIS — I10 Essential (primary) hypertension: Secondary | ICD-10-CM

## 2015-12-16 DIAGNOSIS — G250 Essential tremor: Secondary | ICD-10-CM

## 2015-12-16 DIAGNOSIS — F1010 Alcohol abuse, uncomplicated: Secondary | ICD-10-CM

## 2015-12-16 DIAGNOSIS — Z8679 Personal history of other diseases of the circulatory system: Secondary | ICD-10-CM

## 2015-12-16 HISTORY — DX: Essential (primary) hypertension: I10

## 2015-12-16 HISTORY — DX: Essential tremor: G25.0

## 2015-12-16 MED ORDER — PRIMIDONE 50 MG OR TABS
ORAL_TABLET | ORAL | Status: DC
Start: 2015-12-16 — End: 2016-04-08

## 2015-12-16 MED ORDER — AMLODIPINE BESYLATE 10 MG OR TABS
10.0000 mg | ORAL_TABLET | Freq: Every day | ORAL | Status: DC
Start: 2015-12-16 — End: 2016-01-02

## 2015-12-16 MED ORDER — LISINOPRIL 20 MG OR TABS
20.0000 mg | ORAL_TABLET | Freq: Two times a day (BID) | ORAL | Status: DC
Start: 2015-12-16 — End: 2016-07-21

## 2015-12-16 MED ORDER — PRIMIDONE 50 MG OR TABS
50.0000 mg | ORAL_TABLET | Freq: Three times a day (TID) | ORAL | Status: DC
Start: 2015-12-16 — End: 2015-12-16

## 2015-12-16 NOTE — Telephone Encounter (Signed)
(  TEXTING IS AN OPTION FOR [facility] CLINICS ONLY)  Is this a [facility] clinic? No      RETURN CALL: Detailed message on voicemail only      SUBJECT:  General Message     REASON FOR REQUEST: Please leave verbal approval of new start date for home health    MESSAGE: [facility] to ask let Dr. Marisa SprinklesKraft know that home health was going to do a start of care for this patient on 12/16/15 but the patient refused that date so they would like to start 12/17/15. Please call with verbal approval of new start of care date.
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
## 2015-12-16 NOTE — Progress Notes (Signed)
Does patient have 3 or more complaints:  NO  If yes, discuss with patient the need to schedule a follow up appointment due to limited appointment time.  Follow up appointment scheduled:  NO  Care Everywhere records available/ reconciled:  NO  PHQ2 complete:  NO  PHQ9 complete:  NO  Refills pended:  NO  Orders pended:  NO  Medications to discontinue:  NO    Health Maintenance updated:  YES    Health Maintenance   Topic Date Due   . Hepatitis C Screen  1951/05/10   . HIV Screen  1951/05/10   . Tetanus Vaccine  07/07/1964   . Cholesterol Test  07/08/1987   . Colon Cancer Screen w/ FOBT/FIT  07/07/2002   . Zoster Vaccine  07/07/2012   . Influenza Vaccine (Season Ended) 04/03/2016

## 2015-12-17 ENCOUNTER — Telehealth (HOSPITAL_BASED_OUTPATIENT_CLINIC_OR_DEPARTMENT_OTHER): Payer: Self-pay

## 2015-12-17 ENCOUNTER — Telehealth (INDEPENDENT_AMBULATORY_CARE_PROVIDER_SITE_OTHER): Payer: Self-pay | Admitting: Internal Medicine

## 2015-12-17 DIAGNOSIS — R52 Pain, unspecified: Secondary | ICD-10-CM

## 2015-12-17 DIAGNOSIS — I10 Essential (primary) hypertension: Secondary | ICD-10-CM

## 2015-12-17 MED ORDER — ACETAMINOPHEN 325 MG OR TABS
325.0000 mg | ORAL_TABLET | Freq: Four times a day (QID) | ORAL | Status: DC | PRN
Start: 2015-12-17 — End: 2016-01-02

## 2015-12-17 NOTE — Telephone Encounter (Signed)
Let her know  Can do voltaren gel as he has been driking  Dollar Generalk tylenol     Call pharmacy , no narcotic, cancel narcotic script

## 2015-12-17 NOTE — Telephone Encounter (Signed)
Ronnie Mason, a home health nurse from [facility] called. The doctor she has been working under is saying that pt shouldn't have hydrocodone and is asking what a good substitute would be. Please give Ronnie Landngela a call to discuss as soon as possible.

## 2015-12-17 NOTE — Telephone Encounter (Signed)
5/16-no answer, left VM

## 2015-12-18 ENCOUNTER — Telehealth (INDEPENDENT_AMBULATORY_CARE_PROVIDER_SITE_OTHER): Payer: Self-pay | Admitting: Internal Medicine

## 2015-12-18 NOTE — Telephone Encounter (Signed)
We do not do verbals. Left message for Marylene Landngela to call back.

## 2015-12-18 NOTE — Telephone Encounter (Signed)
5/17-no answer, left VM.

## 2015-12-18 NOTE — Telephone Encounter (Signed)
Left message to call back  

## 2015-12-18 NOTE — Telephone Encounter (Signed)
Marylene Landngela from [facility] called. She said Dr Marisa SprinklesKraft told Ronnie Mason not to take the hydrocodone at his last appt. She was out today to see him and he is continuing to take it. She would like to know why she wanted him to discontinue it and what type of hydrocodone was it. He did not show her the bottle but admitted he was still on it

## 2015-12-18 NOTE — Telephone Encounter (Signed)
Spoke with Ronnie Mason informed Ronnie Mason is not to take Hydrocodone.  Gave other options voltaren gel and Tylenol.

## 2015-12-20 ENCOUNTER — Encounter (HOSPITAL_BASED_OUTPATIENT_CLINIC_OR_DEPARTMENT_OTHER): Payer: Self-pay | Admitting: Anesthesiology

## 2015-12-23 ENCOUNTER — Telehealth (INDEPENDENT_AMBULATORY_CARE_PROVIDER_SITE_OTHER): Payer: Self-pay | Admitting: Internal Medicine

## 2015-12-23 NOTE — Telephone Encounter (Signed)
(  TEXTING IS AN OPTION FOR [facility] CLINICS ONLY)  Is this a [facility] clinic? No      RETURN CALL: Detailed message on voicemail only      SUBJECT:  General Message     REASON FOR REQUEST: The patient was given a home health referral by Dr Marisa SprinklesKraft 05/15. His wife called to let the clinic know that the referral accidentally went to [facility] of [facility]. She said that [facility] has been coming by and it doesn't appear that any action needs to be taken, she just wanted to make sure that the patients records reflect what is going on. He is not receiving hospice care. She provided the address and phone number for [facility] just in case. [address] [place] [id number] [phone]    MESSAGE: Please call if any concerns or questions, otherwise no call back requested. Thank you!

## 2015-12-24 ENCOUNTER — Telehealth (INDEPENDENT_AMBULATORY_CARE_PROVIDER_SITE_OTHER): Payer: Self-pay | Admitting: Internal Medicine

## 2015-12-24 NOTE — Telephone Encounter (Signed)
Please call pharmacy to make sure that there are no refills but cancel all prescriptions for narcotics    According to home health nurse  Patient has been told not to take this as he's been drinking     He is still drinking 3 beers a day  Cognitively he went out walking himself again and is probably not having a lot of insight    Working on setting goals  He has a follow-up with neurology not until June

## 2015-12-24 NOTE — Telephone Encounter (Signed)
Please make sure you call the pharmacy and make sure no narcotic is to be dispensed on further prescription by me

## 2015-12-24 NOTE — Telephone Encounter (Signed)
Natalia LeatherwoodKatherine from [facility] called to let Dr Marisa SprinklesKraft know that pt is misusing his medication hydrocodone. Pt is still taking 1 a day in the morning. Please follow up with her at [phone].

## 2015-12-25 NOTE — Telephone Encounter (Signed)
Called pharmacy and canceled narcotics from Dr. Marisa SprinklesKraft.

## 2015-12-26 ENCOUNTER — Telehealth (INDEPENDENT_AMBULATORY_CARE_PROVIDER_SITE_OTHER): Payer: Self-pay | Admitting: Neurology

## 2015-12-26 ENCOUNTER — Telehealth (INDEPENDENT_AMBULATORY_CARE_PROVIDER_SITE_OTHER): Payer: Self-pay | Admitting: Internal Medicine

## 2015-12-26 NOTE — Telephone Encounter (Signed)
Please review

## 2015-12-26 NOTE — Telephone Encounter (Signed)
Spoke to Ronnie Mason. Ronnie Mason fell @ home and was in the ICU @  to 10 days with a head injury.  Still having cognitive problems but better and having outpatient therapy.Saw PCP and was told to see Dr. Miguel DibbleKirschner sooner than his June 21st appt. He was scheduled for 01-15-16 and will keep other appt in case he needs both.

## 2015-12-26 NOTE — Telephone Encounter (Signed)
(  TEXTING IS AN OPTION FOR [facility] CLINICS ONLY)  Is this a [facility] clinic? No      RETURN CALL: Detailed message on voicemail only      SUBJECT:  General Message     REASON FOR REQUEST: Appt     MESSAGE: Please contact pt's wife to schedule hospital discharge appt for IPH.

## 2015-12-26 NOTE — Telephone Encounter (Signed)
(  TEXTING IS AN OPTION FOR [facility] CLINICS ONLY)  Is this a [facility] clinic? No      RETURN CALL: Detailed message on voicemail only      SUBJECT:  General Message     REASON FOR REQUEST: Recommendation    MESSAGE: Patient would like a recommendation for his wound on his upper lip.

## 2015-12-29 NOTE — Progress Notes (Signed)
Ronnie Mason is a 65 year old male     Review of patient's allergies indicates:  Allergies   Allergen Reactions   . Bee Venom Hives, Itching and Swelling   . Gabapentin      Flu like symptoms, diarrhea, body ache upset stomach   . Lidocaine Rash     Lidocaine patch. PATIENT STATES HE IS ALLERGIC TO THE ADHESIVE NOT THE PATCH.       BP 108/72   Pulse 70   Ht 6' 2.25" (1.886 m)   Wt 217 lb (98.4 kg)   SpO2 98%   BMI 27.67 kg/m     Chief Complaint   Patient presents with   . 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, w h/o chronic back pain and bilateral foot paresthesia and gait problems    Today follow-up above issues:     This last week still got message that he was still taking narcotic and still drinking 3 drinks per day -reported to me by home health    Wife [name] here at visit , she reported he had some beers but he declined it, now said he is drinking non- alcoholic beer ( he said in basement ) , he did sneak out w- the car , she now has the keys    Pt /ot/speech cog - pt done  he is doing exercises, ot done today last time, speech on going June 21 - working on [employer] , memory  , impulse , safe practices      Has not been driving now    On pain pill - I told him I will not be prescribing this for him as alcohol and narcotic does not match   He has pain in back , an old issue new pain on left toe since fall and right thumb and healing pain on lip, he want it eventually re done as he has difficulty closing lips     He took himself off amlodipine 10 mg 5 days ago as he was worried about bp   quit amlodipine 110/80  137/92, this am     Out patient pt ot speech June 22- [facility]      Office Visit on 01/02/16   1. CBC, DIFF   Result Value Ref Range    WBC 6.83 4.3 - 10.0 10*3/uL    RBC 4.28 (L) 4.40 - 5.60 10*6/uL    Hemoglobin 14.2 13.0 - 18.0 g/dL    Hematocrit 43 38 - 50 %    MCV 101 (H) 81 - 98 fL    MCH 33.2 27.3 - 33.6 pg    MCHC 32.9 32.2 - 36.5 g/dL    Platelet Count 161 096 - 400 10*3/uL    RDW-CV 12.0 11.6 - 14.4 %    % Neutrophils 54 %    % Lymphocytes 31 %    % Monocytes 11 %    % Eosinophils 3 %    % Basophils 1 %    % Immature Granulocytes 0 %    Neutrophils 3.68 1.80 - 7.00 10*3/uL    Absolute Lymphocyte Count 2.10  1.00 - 4.80 10*3/uL    Monocytes 0.76 0.00 - 0.80 10*3/uL    Absolute Eosinophil Count 0.22 0.00 - 0.50 10*3/uL    Basophils 0.05 0.00 - 0.20 10*3/uL    Immature Granulocytes 0.02 0.00 - 0.05 10*3/uL    Nucleated RBC 0.00 0.00 10*3/uL    % Nucleated RBC 0 %   2. COMPREHENSIVE METABOLIC PANEL   Result Value Ref Range    Sodium 138 135 - 145 meq/L    Potassium 4.5 3.6 - 5.2 meq/L    Chloride 104 98 - 108 meq/L    Carbon Dioxide, Total 25 22 - 32 meq/L    Anion Gap 9 4 - 12    Glucose 99 62 - 125 mg/dL    Urea Nitrogen 22 (H) 8 - 21 mg/dL    Creatinine 0.98 1.19 - 1.18 mg/dL    Protein (Total) 7.0 6.0 - 8.2 g/dL    Albumin 4.3 3.5 - 5.2 g/dL    Bilirubin (Total) 0.3 0.2 - 1.3 mg/dL    Calcium 9.4 8.9 - 14.7 mg/dL    AST (GOT) 19 9 - 38 U/L    Alkaline Phosphatase (Total) 81 37 - 159 U/L    ALT (GPT) 21 10 - 48 U/L    GFR, Calc, European American >60 mL/min/[1.73_m2]    GFR, Calc, African American >60 mL/min/[1.73_m2]    GFR, Information       Calculated GFR in mL/min/1.73 m2 by MDRD equation.  Inaccurate with changing renal function.  See [email or web address]   3. CRP, HIGH SENSITIVITY   Result Value Ref Range    CRP, High Sensitivity 1.8 0.0 - 10.0 mg/L   4. CREATINE KINASE TOTAL ACTIVITY   Result Value Ref Range    Creatinine Kinase Total Activity 118 62 - 325 U/L         MEDICATION PRIOR TO VISIT  Reports   Outpatient Medications  Prior to Visit   Medication Sig Dispense Refill   . Acetaminophen 325 MG Oral Tab Take 1 tablet (325 mg) by mouth every 6 hours as needed for pain. 50 tablet 0   . AmLODIPine Besylate 10 MG Oral Tab Take 1 tablet (10 mg) by mouth daily. 90 tablet 3   . AmLODIPine Besylate 10 MG Oral Tab Take 1 tablet (10 mg) by mouth daily. 90 tablet 0   . Atorvastatin Calcium 10 MG Oral Tab      . Atorvastatin Calcium 10 MG Oral Tab Take 1 tablet (10 mg) by mouth daily. 90 tablet 1   . Clobetasol Propionate 0.05 % External Ointment      . Clobetasol Propionate 0.05 % External Ointment Small amount to rash as needed bid for 1-2 weeks at a time 15 g 1   . Cyanocobalamin 1000 MCG Oral Tab one per day over-the-counter started 5/21 /2012 this level borderline low 1 Tab 1   . Diclofenac Sodium (VOLTAREN) 1 % Transdermal Gel Apply 4 g topically 4 times a day. Apply to neck, trapezius muscle, and low back. No more than 4 g applied at a time. 1 Tube 5   . Diclofenac Sodium 1 % Transdermal Gel      . DULoxetine HCl 60 MG Oral CAPSULE ENTERIC COATED PARTICLES      . DULoxetine HCl 60 MG Oral CAPSULE ENTERIC COATED PARTICLES Take 1 capsule (60 mg) by mouth daily. 90 capsule 1   . EPINEPHrine 0.3 MG/0.3ML Injection Solution Auto-injector Inject as instructed per patient package insert, 0.3 mg  intramuscularly or subcutaneously into the thigh, if needed to treat anaphylaxis 2 Autoinjector 0   . Lidocaine 5 % External Patch      . Lidocaine 5 % External Patch Apply 1 patch onto the skin daily. Apply to painful area for up to 12 hours in a 24 hour period. 30 patch 10   . Lisinopril 20 MG Oral Tab Take 1 tablet (20 mg) by mouth every 12 hours.     . Lisinopril 20 MG Oral Tab Take 1 tablet (20 mg) by mouth every 12 hours. 180 tablet 1   . Primidone 50 MG Oral Tab 1 po bid 180 tablet 0   . Primidone 50 MG Oral Tab Take 1 tablet (50 mg) by mouth 2 times a day. 60 tablet 3   . THIAMINE HCL OR        No facility-administered medications prior to visit.           REVIEW OF SYSTEMS - are otherwise negative unless as noted in HPI above or as additional issues below    Constitutional:   sleep 6 hr at time and during day and am and afternoon 12 hrs/d  per wife   Eyes:    Head ears, nose mouth throat:     Cardiovascular:   Respiratory:     Gastrointestinal:   Genitourinary:      Musculoskeletal:  Left toe pain and r thumb pain post fall  Integumentary:     Neurological:   Psychiatric/Behavioral:       Endocrine:     Hematological:   Allergic/Immunologic:        PHYSICAL EXAM  BP 108/72   Pulse 70   Ht 6' 2.25" (1.886 m)   Wt 217 lb (98.4 kg)   SpO2 98%   BMI 27.67 kg/m     Well Dressed    Constitution:  In general looks:   well , good coloring  No diaphoresis    Patient is Alert  & Attentive  Patient is conversive    Psychiatric Mental Status / Neurologic Mental Status:  Affect:  Pleasant   Calm  Mood:  Good  Awake: Alert and attentive  Affect is flat but does seem more lively than last week     Eyes: no discharge, no gross puffiness, non- icteric  Ears: clear  Nose: clear  Mouth & Throat: no lesions or exudate  No visible suit ure on lips , scar tissue noted     Neck:    Thyroid not enlarged  2+ carotid pulses no bruits    Cardiovascular:   Heart Regular rate , rhythm, no murmur    No edema    Lymph nodes:  No gross cervical swelling  No gross axillary swelling    Respiratory:   Not tachyphpnic  Clear to ascultation  No rales, rhonchi , without increased effort or stridor  No clubbing     Abdomen / Gastrointestinal:  Soft. No masses. No guarding. No rebound  No gross bulge or hernia. Abdomen, groin  No bruit or pulsatile mass  Bowel with normal tone    Genitourinary:  No suprapubic tenderness  No increased flank pain with percussion    Musculoskeletal:  No gross abnormal movement, twitch   No gross atrophy or deformity    Skin:  No gross lesion or rash    Neurologic:   Grossly intact  Memory:   Attention       Cranial Nerve    Eyes pupil and  lid are intact   II,  III, IV, VIII   Facial Movement VII: grossly intact, no facial droop       Up and go no problem       Foot exam:  Left toe slightly pink  Right thumb tender at base , no synovitis     Hematologic/Lymphatic /Immunologic:  No gross bruising or rash or hives       IMPRESSION / PLAN / DISCUSSION     Onalee HuaDavid was seen today for follow-up .    Diagnoses and all orders for this visit:    Alcohol abuse    Cerebral hemorrhage, nontraumatic (HCC)  Comments:   [place]-4/29- 12/10/2015 intraparenchymal hemorrhage /hypertensive of left thalamus , sub arachnoid hemorrhage      Pain    Body aches  -     CBC, DIFF  -     COMPREHENSIVE METABOLIC PANEL  -     VITAMIN D (25 HYDROXY)  -     SED RATE  -     CRP, HIGH SENSITIVITY  -     CREATINE KINASE TOTAL ACTIVITY  -     THYROID STIMULATING HORMONE  -     T4, FREE    Essential hypertension  -     AmLODIPine Besylate 2.5 MG Oral Tab; Take 1 tablet (2.5 mg) by mouth daily.    Lip pain    Pain of right great toe  -     XR FOOT 3 VW RIGHT    Pain of right thumb  -     XR HAND 3+ VW RIGHT    Insomnia, unspecified type  -     Melatonin 3 MG Oral Tab; 2 at night as needed for sleep      Because of the bleed felt to be from hemorrhage from bp will try amlodipine 2.5 and see how he feels   Am worried bp may be rising the rest of day  Ok the melatonin he has been on    To be off narcotics, per his wife he did not need it in the hospital and he has a history of drinking   Welcome to have other providers take over    [facility]     No driving    May eventually need to see plastics if no cerebral issue I suspect 3 mo or if ok by Dr Kirschner   Try Vaseline       Not sure why body aches everywhere   Excessive sleep may be his brain recovering      MEDICATION as of Discharge  Current Outpatient Prescriptions   Medication Sig Dispense Refill   . Acetaminophen 500 MG Oral Tab 2 tablets bid     . AmLODIPine Besylate 2.5 MG Oral Tab Take 1 tablet (2.5 mg) by mouth daily. 90 tablet  3   . Atorvastatin Calcium 10 MG Oral Tab Take 1 tablet (10 mg) by mouth daily. 90 tablet 1   . Clobetasol Propionate 0.05 % External Ointment Small amount to rash as needed bid for 1-2 weeks at a time 15 g 1   . Cyanocobalamin 1000 MCG Oral Tab one per day over-the-counter started 5/21 /2012 this level borderline low 1 Tab 1   . Diclofenac Sodium (VOLTAREN) 1 % Transdermal Gel Apply 4 g topically 4 times a day. Apply to neck, trapezius muscle, and low back. No more than 4 g applied at a time. 1 Tube 5   .  DULoxetine HCl 60 MG Oral CAPSULE ENTERIC COATED PARTICLES Take 1 capsule (60 mg) by mouth daily. 90 capsule 1   . EPINEPHrine 0.3 MG/0.3ML Injection Solution Auto-injector Inject as instructed per patient package insert, 0.3 mg intramuscularly or subcutaneously into the thigh, if needed to treat anaphylaxis 2 Autoinjector 0   . Glucosamine-Chondroitin (GLUCOSAMINE CHONDR COMPLEX OR)      . Lidocaine 5 % External Patch      . Lidocaine 5 % External Patch Apply 1 patch onto the skin daily. Apply to painful area for up to 12 hours in a 24 hour period. 30 patch 10   . Lisinopril 20 MG Oral Tab Take 1 tablet (20 mg) by mouth every 12 hours. 180 tablet 1   . Melatonin 3 MG Oral Tab 2 at night as needed for sleep 1 tablet 1   . Primidone 50 MG Oral Tab 1 po bid 180 tablet 0   . THIAMINE HCL OR      . TURMERIC OR        No current facility-administered medications for this visit.          Risk of taking medication properly and follow up discussed especially to call if problem    Patient   Family Member -   express understanding of care plan/medications    I spent a total time of 40  minutes face-to-face with the patient, of which more than 50% was spent counseling and coordinating care as outlined in this note.    No Follow-up on file.     Cc to following MD:                ++++++++++++++++++++++++++++++++++++++++++++++++++        Patient Active Problem List    Diagnosis Date Noted   . Essential hypertension [I10]  12/16/2015   . Essential tremor [G25.0] 12/16/2015   . Hx of spontan intraparenchymal intracran bleed assoc with hypertension [Z86.79] 12/16/2015     [place] 4/29- 12/10/2015  Non-traumatic intraparenchymal hemorrhage, hypertensive of the left thalamus   Traumatic hemorrhage   -- falcine subdural hemorrhage, small cortical subarachnoid     . Alcohol abuse [F10.10] 12/16/2015   . 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 History of alcohol dependence (HCC) [F10.21] 06/26/2015   . 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 Idiopathic peripheral neuropathy [G60.9] 03/15/2015   . 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 Tremor [R25.1] 04/20/2013   . Lumbosacral radiculopathy at S1 [M54.17] 03/22/2013     Left     . Fall [W19.XXXA] 02/23/2013     BP < 100  Occasioanal tri[p and leg weakness     . Essential tremor [G25.0] 02/15/2013   . Back pain, lumbosacral [M54.5, M54.89] 02/15/2013   . Lumbar radiculopathy, chronic [M54.16] 02/15/2013   . Neck pain [M54.2] 02/15/2013   . Chronic pain [G89.29] 12/21/2012     Now at [place], DO, Santiago Glad    Neck pain -burn c3-7 on left side per patient  Some trigger injection    Lumbar pain-since 80's better with exercise     . 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 Numbness and tingling of leg [R20.2] 12/21/2012     Left calf -left foot long term suspect from back-suspect L 45     .  Chronic back pain [M54.9, G89.29] 12/21/2012     Mri in mid scape 1/214  Multilevel degenerative disc disease of the lumbar spine. Circumferential disc bulge at all levels below and including L1-2. No significant central spinal canal stenosis or neuroforaminal narrowing.  2. Mild retrolisthesis of L5 on S1.           . B12 deficiency [E53.8] 12/21/2012     Borderline low 5 /21/2014     . Chronic  renal insufficiency, stage III (moderate) [N18.3] 12/21/2012     5/ 2013     . Hyperlipidemia [E78.5] 09/26/2012   . Cervicalgia [M54.2] 03/11/2010     Radiofrequency ablation of c3 to c 7     . Syncope [R55]    . Hypotension [I95.9]    . Essential hypertension [I10]    . 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 URI (upper respiratory infection) [J06.9]          Past Medical History:   Diagnosis Date   . Carotid Sinus Hypersensitivity    . Chronic kidney disease    . History of EKG 5/97   . HYPERTENSION     . HYPOTENSION     . Lipidemia    . 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 Syncope    . URI (upper respiratory infection)        Social History     Social History   . Marital status: Married     Spouse name: N/A   . Number of children: N/A   . Years of education: N/A     Occupational History   . Not on file.     Social History Main Topics   . Smoking status: Not on file   . Smokeless tobacco: Not on file   . Alcohol use Yes   . Drug use: No   . Sexual activity: Not on file     Other Topics Concern   . Not on file     Social History Narrative    ** Merged History Encounter **              Family History   Problem Relation Age of Onset   . Colon Cancer Father    . Heart (other) Mother      MI   . Other Family Hx Other      myelodysplasia       Social History     Social History   . Marital status: Married     Spouse name: N/A   . Number of children: N/A   . Years of education: N/A     Social History Main Topics   . Smoking status: Not on file   . Smokeless tobacco: Not on file   . Alcohol use Yes   . Drug use: No   . Sexual activity: Not on file     Other Topics Concern   . Not on file     Social History Narrative    ** Merged History Encounter **            Health Maintenance   Topic Date Due   . Diabetes Screen  12/06/2018   . Cholesterol Test  09/11/2020   . Colonoscopy  09/24/2020   . Tetanus Vaccine  12/22/2022   . Zoster Vaccine  Completed   . Influenza Vaccine   Completed   . Hepatitis C Screen  Addressed   . HIV Screen  Addressed

## 2016-01-01 ENCOUNTER — Other Ambulatory Visit (INDEPENDENT_AMBULATORY_CARE_PROVIDER_SITE_OTHER): Payer: Self-pay

## 2016-01-01 DIAGNOSIS — Z8679 Personal history of other diseases of the circulatory system: Secondary | ICD-10-CM

## 2016-01-01 NOTE — Progress Notes (Signed)
done

## 2016-01-01 NOTE — Telephone Encounter (Signed)
Patient has appointment tomorrow

## 2016-01-02 ENCOUNTER — Encounter (INDEPENDENT_AMBULATORY_CARE_PROVIDER_SITE_OTHER): Payer: Self-pay | Admitting: Internal Medicine

## 2016-01-02 ENCOUNTER — Ambulatory Visit: Payer: PPO | Attending: Internal Medicine

## 2016-01-02 ENCOUNTER — Ambulatory Visit (HOSPITAL_BASED_OUTPATIENT_CLINIC_OR_DEPARTMENT_OTHER): Payer: PPO

## 2016-01-02 ENCOUNTER — Ambulatory Visit (INDEPENDENT_AMBULATORY_CARE_PROVIDER_SITE_OTHER): Payer: PPO | Admitting: Internal Medicine

## 2016-01-02 VITALS — BP 108/72 | HR 70 | Ht 74.25 in | Wt 217.0 lb

## 2016-01-02 DIAGNOSIS — F1010 Alcohol abuse, uncomplicated: Secondary | ICD-10-CM

## 2016-01-02 DIAGNOSIS — R52 Pain, unspecified: Secondary | ICD-10-CM

## 2016-01-02 DIAGNOSIS — M79644 Pain in right finger(s): Secondary | ICD-10-CM

## 2016-01-02 DIAGNOSIS — I619 Nontraumatic intracerebral hemorrhage, unspecified: Secondary | ICD-10-CM

## 2016-01-02 DIAGNOSIS — I10 Essential (primary) hypertension: Secondary | ICD-10-CM

## 2016-01-02 DIAGNOSIS — M79674 Pain in right toe(s): Secondary | ICD-10-CM | POA: Insufficient documentation

## 2016-01-02 DIAGNOSIS — K130 Diseases of lips: Secondary | ICD-10-CM

## 2016-01-02 DIAGNOSIS — G4700 Insomnia, unspecified: Secondary | ICD-10-CM

## 2016-01-02 LAB — COMPREHENSIVE METABOLIC PANEL
ALT (GPT): 21 U/L (ref 10–48)
AST (GOT): 19 U/L (ref 9–38)
Albumin: 4.3 g/dL (ref 3.5–5.2)
Alkaline Phosphatase (Total): 81 U/L (ref 37–159)
Anion Gap: 9 (ref 4–12)
Bilirubin (Total): 0.3 mg/dL (ref 0.2–1.3)
Calcium: 9.4 mg/dL (ref 8.9–10.2)
Carbon Dioxide, Total: 25 meq/L (ref 22–32)
Chloride: 104 meq/L (ref 98–108)
Creatinine: 1.16 mg/dL (ref 0.51–1.18)
GFR, Calc, African American: >60 mL/min/{1.73_m2}
GFR, Calc, European American: >60 mL/min/{1.73_m2}
Glucose: 99 mg/dL (ref 62–125)
Potassium: 4.5 meq/L (ref 3.6–5.2)
Protein (Total): 7 g/dL (ref 6.0–8.2)
Sodium: 138 meq/L (ref 135–145)
Urea Nitrogen: 22 mg/dL — ABNORMAL HIGH (ref 8–21)

## 2016-01-02 LAB — CBC, DIFF
% Basophils: 1 %
% Eosinophils: 3 %
% Immature Granulocytes: 0 %
% Lymphocytes: 31 %
% Monocytes: 11 %
% Neutrophils: 54 %
% Nucleated RBC: 0 %
Absolute Eosinophil Count: 0.22 10*3/uL (ref 0.00–0.50)
Absolute Lymphocyte Count: 2.1 10*3/uL (ref 1.00–4.80)
Basophils: 0.05 10*3/uL (ref 0.00–0.20)
Hematocrit: 43 % (ref 38–50)
Hemoglobin: 14.2 g/dL (ref 13.0–18.0)
Immature Granulocytes: 0.02 10*3/uL (ref 0.00–0.05)
MCH: 33.2 pg (ref 27.3–33.6)
MCHC: 32.9 g/dL (ref 32.2–36.5)
MCV: 101 fL — ABNORMAL HIGH (ref 81–98)
Monocytes: 0.76 10*3/uL (ref 0.00–0.80)
Neutrophils: 3.68 10*3/uL (ref 1.80–7.00)
Nucleated RBC: 0 10*3/uL
Platelet Count: 233 10*3/uL (ref 150–400)
RBC: 4.28 10*6/uL — ABNORMAL LOW (ref 4.40–5.60)
RDW-CV: 12 % (ref 11.6–14.4)
WBC: 6.83 10*3/uL (ref 4.3–10.0)

## 2016-01-02 LAB — T4, FREE: Thyroxine (Free): 0.8 ng/dL (ref 0.6–1.2)

## 2016-01-02 LAB — SED RATE: Erythrocyte Sedimentation Rate: 15 mm/h (ref 0–15)

## 2016-01-02 LAB — C_REACTIVE PROTEIN: C_Reactive Protein: 1.8 mg/L (ref 0.0–10.0)

## 2016-01-02 LAB — CK, CREATINE KINASE, TOTAL ACTIVITY: Creatine Kinase Total Activity: 118 U/L (ref 62–325)

## 2016-01-02 LAB — THYROID STIMULATING HORMONE: Thyroid Stimulating Hormone: 1.127 u[IU]/mL (ref 0.400–5.000)

## 2016-01-02 MED ORDER — AMLODIPINE BESYLATE 2.5 MG OR TABS
2.5000 mg | ORAL_TABLET | Freq: Every day | ORAL | 3 refills | Status: DC
Start: 2016-01-02 — End: 2016-01-30

## 2016-01-02 MED ORDER — MELATONIN 3 MG OR TABS
ORAL_TABLET | ORAL | 1 refills | Status: DC
Start: 2016-01-02 — End: 2016-02-27

## 2016-01-02 NOTE — Progress Notes (Signed)
So far blood work ok more labs coming  mcv may be because of the alcohol not because of b12   No abnormal inflammatory lab seen   Normal muscle enzymes     Office Visit on 01/02/16  1. CBC, DIFF       Result                                            Value                         Ref Range                       WBC                                               6.83                          4.3 - 10.0 10*3/uL              RBC                                               4.28 (L)                      4.40 - 5.60 10*6/uL             Hemoglobin                                        14.2                          13.0 - 18.0 g/dL                Hematocrit                                        43                            38 - 50 %                       MCV                                               101 (H)                       81 - 98 fL                      MCH  33.2                          27.3 - 33.6 pg                  MCHC                                              32.9                          32.2 - 36.5 g/dL                Platelet Count                                    233                           150 - 400 10*3/uL               RDW-CV                                            12.0                          11.6 - 14.4 %                   % Neutrophils                                     54                            %                               % Lymphocytes                                     31                            %                               % Monocytes                                       11                            %                               %  Eosinophils                                     3                             %                               % Basophils                                       1                             %                               % Immature Granulocytes                           0                              %                               Neutrophils                                       3.68                          1.80 - 7.00 10*3/uL             Absolute Lymphocyte Count                         2.10                          1.00 - 4.80 10*3/uL             Monocytes                                         0.76                          0.00 - 0.80 10*3/uL             Absolute Eosinophil Count                         0.22                          0.00 - 0.50 10*3/uL             Basophils  0.05                          0.00 - 0.20 10*3/uL             Immature Granulocytes                             0.02                          0.00 - 0.05 10*3/uL             Nucleated RBC                                     0.00                          0.00 10*3/uL                    % Nucleated RBC                                   0                             %                          2. COMPREHENSIVE METABOLIC PANEL       Result                                            Value                         Ref Range                       Sodium                                            138                           135 - 145 meq/L                 Potassium                                         4.5                           3.6 - 5.2 meq/L                 Chloride  104                           98 - 108 meq/L                  Carbon Dioxide, Total                             25                            22 - 32 meq/L                   Anion Gap                                         9                             4 - 12                          Glucose                                           99                            62 - 125 mg/dL                  Urea Nitrogen                                     22 (H)                        8 - 21 mg/dL                    Creatinine                                        1.16                           0.51 - 1.18 mg/dL               Protein (Total)                                   7.0                           6.0 - 8.2 g/dL                  Albumin  4.3                           3.5 - 5.2 g/dL                  Bilirubin (Total)                                 0.3                           0.2 - 1.3 mg/dL                 Calcium                                           9.4                           8.9 - 10.2 mg/dL                AST (GOT)                                         19                            9 - 38 U/L                      Alkaline Phosphatase (Total)                      81                            37 - 159 U/L                    ALT (GPT)                                         21                            10 - 48 U/L                     GFR, Calc, European American                      >60                           mL/min/[1.73_m2]                GFR, Calc, African American                       >60  mL/min/[1.73_m2]                GFR, Information                                                                                            Calculated GFR in mL/min/1.73 m2 by MDRD equation.  Inaccurate with changing renal function.  See [email or web address]  3. CRP, HIGH SENSITIVITY       Result                                            Value                         Ref Range                       CRP, High Sensitivity                             1.8                           0.0 - 10.0 mg/L            4. CREATINE KINASE TOTAL ACTIVITY       Result                                            Value                         Ref Range                       Creatinine Kinase Total Activity                  118                           62 - 325 U/L

## 2016-01-02 NOTE — Progress Notes (Signed)
Does patient have 3 or more complaints:  NO  If yes, discuss with patient the need to schedule a follow up appointment due to limited appointment time.  Follow up appointment scheduled:  NO  Care Everywhere records available/ reconciled:  NO  PHQ2 complete:  YES  PHQ9 complete:  YES  Refills pended:  NO  Orders pended:  NO  Medications to discontinue:  NO    Health Maintenance updated:  YES    Health Maintenance   Topic Date Due   . Diabetes Screen  12/06/2018   . Cholesterol Test  09/11/2020   . Colonoscopy  09/24/2020   . Tetanus Vaccine  12/22/2022   . Zoster Vaccine  Completed   . Influenza Vaccine  Completed   . Hepatitis C Screen  Addressed   . HIV Screen  Addressed

## 2016-01-03 ENCOUNTER — Encounter (HOSPITAL_BASED_OUTPATIENT_CLINIC_OR_DEPARTMENT_OTHER): Payer: Self-pay | Admitting: Physical Medicine & Rehabilitation

## 2016-01-03 ENCOUNTER — Ambulatory Visit (HOSPITAL_BASED_OUTPATIENT_CLINIC_OR_DEPARTMENT_OTHER): Payer: PPO | Attending: Physical Medicine & Rehabilitation | Admitting: Physical Medicine & Rehabilitation

## 2016-01-03 VITALS — BP 136/102 | HR 93 | Temp 98.1°F | Ht 74.25 in | Wt 217.0 lb

## 2016-01-03 DIAGNOSIS — I619 Nontraumatic intracerebral hemorrhage, unspecified: Secondary | ICD-10-CM | POA: Insufficient documentation

## 2016-01-03 DIAGNOSIS — S069X9D Unspecified intracranial injury with loss of consciousness of unspecified duration, subsequent encounter: Secondary | ICD-10-CM

## 2016-01-03 NOTE — Progress Notes (Signed)
Physiatry (PM&R)  [facility]    Consult information:  referred by Ronnie Mason, [name], MD for evaluate and treat after brain injury.    Visit Information:  Accompanied to clinic by: his Ronnie Mason, who provides additional history   Additional information obtained from review of electronic medical records   Language: [place];  Last seen by rehab medicine consult on 12/10/15 during his inpatient hospitalization     CC: Brain Injury (New pt Eval )    ID: Mr. 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      HPI:  Discharged to home on 12/10/15 with intermittent supervision from his wife and started home PT, OT, and SLP.  Has been participating well with Ronnie therapies.  He will soon be transitioning to outpatient [facility] at [facility].      Function: no physical assistance with ADLs at home now but needs intermittent supervision due to his impulsivity and decreased reasoning.  He needs help with sorting his medication, since he has trouble remembering Ronnie changes that occurred since his hospitalization.  He takes his medication without reminders once sorted.    He tried to drive since going home despite being told not to, so his wife took all Ronnie keys.  When he tried driving a short distance, he felt off (described as feeling guilty) and returned home.      Cognitive impairment: has difficulty with short term memory.  He easily forgets where Ronnie correct place is for Ronnie dishes in Ronnie kitchen.  Per his wife, he has impulsivity and difficulty with decision making as well as decreased insight into deficits.  For example, he attempted driving despite being told multiple times that he could not.  His wife had to hide all Ronnie car keys.  He also has been continuing to drink alcohol.      He later reports that he has trouble choosing  Ronnie right words and tends to pause.  Slurs his words due to lip injury.      Sleep difficulty: sleeps for about 5-6 hours at night time and needs to take a nap several times a day.  Denies difficulty falling asleep but once he wakes up around 2 am, he has trouble falling back asleep.      Balance impairment: feels about 75% back to baseline.  No falls since going home.  Denies losing balance but still unsteady on his feet.  Able to go up/down stairs without difficulty after 1-2 weeks.      Mood: his wife notices that he is more irritable towards her especially since she has to supervise him and restrict him from driving.       ROS:   Review of systems is as per HPI and below. All other systems reviewed and otherwise negative.    Constitutional: Denies fever or chills  Eyes: Denies blurry or double vision  ENT: Denies tinnitus, hearing trouble, or change in sense of taste/smell  CV: Denies palpitation or irregular heartbeat  Respiratory: Denies shortness of breath or wheezing  GI: Reports nausea without vomiting and diarrhea; denies constipation  GU: Denies hematuria, dysuria, or urinary incontinence  MSK: Reports joint stiffness and back pain.  Has chronic back pain.    Skin: Reports persistent rash  Neuro: Reports balance difficulty and memory difficulties; denies new sensory  change or weakness  Psych: Reports sleep difficulties; denies anxiety or depression  Endo: Spinal hair or nails, heat/cold intolerance or unexplained weight change    Falls since last medical visit: NO No falls since 11/30/15.  Previously, fell 4-5 times per year.     Past Medical and Surgical History:   Chronic low back pain for decades  HTN (labile and difficult to control)  Syncopal episodes  Demyelinating peripheral neuropathy  Essential tremor  S1 radiculopathy  Alcohol use  Chronic renal insufficiency    Family History:   Dad: myelodysplastic syndrome  Mother: deceased.  No significant medical history.     Social History: Lives in [place]  in a three-story home with wife, Ronnie Mason.  There is a 60 foot he'll to climb and 2 steps to enter with rails.  His wife works 3-4 days per week and provide intermittent supervision.  Retired Technical sales engineer (2014).  Denies smoking or drug use.  Graduated college.    Denies drinking alcohol since his injury.  Prior to his injury, he drinks 1 bottle of wine daily for decades.     Review of patient's allergies indicates:  Allergies   Allergen Reactions   . Bee Venom Hives, Itching and Swelling   . Gabapentin      Flu like symptoms, diarrhea, body ache upset stomach   . Lidocaine Rash     Lidocaine patch. PATIENT STATES HE IS ALLERGIC TO Ronnie ADHESIVE NOT Ronnie PATCH.     Current Outpatient Prescriptions   Medication Sig Dispense Refill   . Acetaminophen 500 MG Oral Tab 2 tablets bid     . AmLODIPine Besylate 2.5 MG Oral Tab Take 1 tablet (2.5 mg) by mouth daily. 90 tablet 3   . Atorvastatin Calcium 10 MG Oral Tab Take 1 tablet (10 mg) by mouth daily. 90 tablet 1   . Clobetasol Propionate 0.05 % External Ointment Small amount to rash as needed bid for 1-2 weeks at a time 15 g 1   . Cyanocobalamin 1000 MCG Oral Tab one per day over-Ronnie-counter started 5/21 /2012 this level borderline low 1 Tab 1   . Diclofenac Sodium (VOLTAREN) 1 % Transdermal Gel Apply 4 g topically 4 times a day. Apply to neck, trapezius muscle, and low back. No more than 4 g applied at a time. 1 Tube 5   . DULoxetine HCl 60 MG Oral CAPSULE ENTERIC COATED PARTICLES Take 1 capsule (60 mg) by mouth daily. 90 capsule 1   . EPINEPHrine 0.3 MG/0.3ML Injection Solution Auto-injector Inject as instructed per patient package insert, 0.3 mg intramuscularly or subcutaneously into Ronnie thigh, if needed to treat anaphylaxis 2 Autoinjector 0   . Glucosamine-Chondroitin (GLUCOSAMINE CHONDR COMPLEX OR)      . Lidocaine 5 % External Patch      . Lidocaine 5 % External Patch Apply 1 patch onto Ronnie skin daily. Apply to painful area for up to 12 hours in a 24 hour period. 30 patch  10   . Lisinopril 20 MG Oral Tab Take 1 tablet (20 mg) by mouth every 12 hours. 180 tablet 1   . Melatonin 3 MG Oral Tab 2 at night as needed for sleep 1 tablet 1   . Primidone 50 MG Oral Tab 1 po bid 180 tablet 0   . THIAMINE HCL OR      . TURMERIC OR        No current facility-administered medications for this visit.      Physical Exam:  VS: BP (!) 136/102   Pulse 93   Temp 98.1 F (36.7 C) (Temporal)   Ht 6' 2.25" (1.886 m)   Wt 217 lb (98.4 kg)   SpO2 100%   BMI 27.67 kg/m  Ht 6' 2.252" (1.886 m) Wt 217 lb (98.431 kg) Body mass index is 27.67 kg/m.   Pain: 4/10 back pain aching     GEN: Normally developed, appearing stated age, neatly groomed and dressed in casual clothes, NAD.   HEAD: NC.  Atraumatic.   EYES: Sclera non-icteric, non-injected   EARS, NOSE, MOUTH, THROAT: Oropharynx clear, moist mucus membranes   SKIN: No rashes, ecchymosis or open lesions on hands or face.  Erythema noted with telangiectasia on face.   NEURO:  CRANIAL NERVE:  PERRLA, EOMI without nystagmus, conjugate gaze, saccadic pursuit  able to read clock across Ronnie exam room  No facial droop   hearing adequate for conversation   Shoulder shrug full strength bilaterally   Tongue midline   SPEECH: Fluent and spontaneous.  Normal tone, volume and pace.  No dysarthria.  Tangential.    COORDINATION  No tremor   Rapid alternating movement: No dysdiadochokinesis  PSYCH / COGNITIVE: inappropriate at time, brash, quick to answer with good eye contact  Rancho score: VIII (does not ask for help appropriately and needs supervision with low frustration tolerance, argumentative and irritable)  LOC : Alert   Oriented to: Self, Name of hospital/clinic, Floor, [place], [place], [place], [place], Date, Day of Week, Year, Season   Registration: 5/5 after first trial; able to spell WORLD forwards and backwards (desk   Recall: 4/5 at 5 minutes; no interference from spelling task.  With category cue, unable to recall.  With multiple choice cue, able to recall  Ronnie last word.   Visuospatial: Able to copy cuboid accurately   Visuoconstructional: Able to draw a circle and place all numbers present and in correct quadrants but unable to draw hands for correct time requested (10:45).  He is able to correct Ronnie numbers on his own without cues.  He initially draws hand to 10 and 11 and has to verbally say which is minute and hour hand.  Despite looking at Ronnie drawing and pointing at Ronnie hands, he is unable to recognize his mistake. Even with additional cue, says he placed Ronnie hand correctly.  When asked where Ronnie minute hand should be fore 45, he recognizes Ronnie error and draws Ronnie third hand.      EXECUTIVE FUNCTION:   Verbal fluency: able to name 12 words starting with Ronnie letter F in one minute with 2 errors without repetition or profanity.  Total 10 words. N >11. Free, fun, family, frolic, fast, furious, fake, [place] Ro[place] (recognizes that he made error and gives up), forgetful, freaky, favoritism  Saccades: 1/10 error, scanning   Antisaccades: 11/20 errors; continues to look at Ronnie moving finger.  Recognizes error but does not improve with practice.     Results Reviewed:   Reviewed images his head CT scans performed during his hospitalization in person with Mr. Congrove and his wife.      Assessment and Plan:  Ronnie Mason is a 65 year old year old male now almost one month out from left thalamic IPH, complicated-mild TBI with SDH and facial fractures after a ground level fall on 11/30/15 with resultant cognitive communication impairment and irritability.  Further assessment/treatment recommended for Ronnie following issues:    1. Complicated-mild TBI with SDH, left thalamic IPH and facial  fractures: Mr. Hartig wanted to know if his thalamic IPH occurred prior or after his fall.  Counseled at length that it would be difficult to find out exactly but that more probable than not he sustained a TBI in addition to IPH given Ronnie SDH and facial fracture.  He did not recall  education he received on TBI during his hospitalization, so Ronnie imaging findings as well as his risk for recurrent TBI and seizure was discussed.   - counseled on why he should avoid drinking alcohol at this time and why he should not drive.  Discussed that he may need on-road driving evaluation before returning to driving without restriction.    - no medication changes at this time   - continue with PT, OT and SLP with plans to transition to outpatient at [facility]    - continue with intermittent supervision   - discussed with his wife that it's important for her to also have support and education opportunity per organizations such as [facility] [place]    2. Cognitive impairment: has impulsivity which could be a sign of agitation after TBI.  Currently on multiple medications and reportedly not far off from his baseline.   - No new medications at this time but consider starting beta blocker such as propranolol or divalproex if agitation worsens.    Return to clinic for follow-up: 2 months    Follow-up with other providers as scheduled    At least 70 minutes was spent in face-to-face time in Ronnie care of Ronnie patient, of which more than 50% was spent in counseling  as outlined in this note.

## 2016-01-03 NOTE — Progress Notes (Signed)
Right foot x ray ok by report  If not better will refer you to podiatry at follow up

## 2016-01-03 NOTE — Progress Notes (Signed)
Addendum   Thyroid is ok     Office Visit on 01/02/16  1. CBC, DIFF       Result                                            Value                         Ref Range                       WBC                                               6.83                          4.3 - 10.0 10*3/uL              RBC                                               4.28 (L)                      4.40 - 5.60 10*6/uL             Hemoglobin                                        14.2                          13.0 - 18.0 g/dL                Hematocrit                                        43                            38 - 50 %                       MCV                                               101 (H)                       81 - 98 fL                      MCH  33.2                          27.3 - 33.6 pg                  MCHC                                              32.9                          32.2 - 36.5 g/dL                Platelet Count                                    233                           150 - 400 10*3/uL               RDW-CV                                            12.0                          11.6 - 14.4 %                   % Neutrophils                                     54                            %                               % Lymphocytes                                     31                            %                               % Monocytes                                       11                            %                               %  Eosinophils                                     3                             %                               % Basophils                                       1                             %                               % Immature Granulocytes                           0                             %                               Neutrophils                                       3.68                           1.80 - 7.00 10*3/uL             Absolute Lymphocyte Count                         2.10                          1.00 - 4.80 10*3/uL             Monocytes                                         0.76                          0.00 - 0.80 10*3/uL             Absolute Eosinophil Count                         0.22                          0.00 - 0.50 10*3/uL             Basophils  0.05                          0.00 - 0.20 10*3/uL             Immature Granulocytes                             0.02                          0.00 - 0.05 10*3/uL             Nucleated RBC                                     0.00                          0.00 10*3/uL                    % Nucleated RBC                                   0                             %                          2. COMPREHENSIVE METABOLIC PANEL       Result                                            Value                         Ref Range                       Sodium                                            138                           135 - 145 meq/L                 Potassium                                         4.5                           3.6 - 5.2 meq/L                 Chloride  104                           98 - 108 meq/L                  Carbon Dioxide, Total                             25                            22 - 32 meq/L                   Anion Gap                                         9                             4 - 12                          Glucose                                           99                            62 - 125 mg/dL                  Urea Nitrogen                                     22 (H)                        8 - 21 mg/dL                    Creatinine                                        1.16                          0.51 - 1.18 mg/dL               Protein (Total)                                   7.0                           6.0 - 8.2  g/dL                  Albumin  4.3                           3.5 - 5.2 g/dL                  Bilirubin (Total)                                 0.3                           0.2 - 1.3 mg/dL                 Calcium                                           9.4                           8.9 - 10.2 mg/dL                AST (GOT)                                         19                            9 - 38 U/L                      Alkaline Phosphatase (Total)                      81                            37 - 159 U/L                    ALT (GPT)                                         21                            10 - 48 U/L                     GFR, Calc, European American                      >60                           mL/min/[1.73_m2]                GFR, Calc, African American                       >60  mL/min/[1.73_m2]                GFR, Information                                                                                            Calculated GFR in mL/min/1.73 m2 by MDRD equation.  Inaccurate with changing renal function.  See [email or web address]://[email or web address]  3. SED RATE       Result                                            Value                         Ref Range                       Erythrocyte Sedimentation Rate                    15                            0 - 15 mm/h                4. CRP, HIGH SENSITIVITY       Result                                            Value                         Ref Range                       CRP, High Sensitivity                             1.8                           0.0 - 10.0 mg/L            5. CREATINE KINASE TOTAL ACTIVITY       Result                                            Value                         Ref Range                       Creatinine Kinase Total Activity                  118  62 - 325 U/L               6. THYROID STIMULATING  HORMONE       Result                                            Value                         Ref Range                       Thyroid Stimulating Hormone                       1.127                         0.400 - 5.000 u[IU]/mL     7. T4, FREE       Result                                            Value                         Ref Range                       Thyroxine (Free)                                  0.8                           0.6 - 1.2 ng/dL

## 2016-01-03 NOTE — Patient Instructions (Addendum)
Thank you for coming to [facility] Clinic today.   Our goal is to provide easy to understand instructions.      Reminders from your visit today:  Please continue to exercise.  It's good for your recovery.      Please continue with therapy as scheduled at [facility].     No change was made to your medication today.     Please do not drive at this time.      Please continue to monitor your blood pressure.      You should not drink alcohol at this time because:   o Alcohol may slow your brain from recovering.  It can also increase your risk of reinjury by affecting your cognition and balance.   o Negative effect of alcohol can linger for days or weeks after the last drink after brain injury.    o Alcohol is a depressant; drinking after TBI may increase the risk of depression  o Alcohol can reduce effectiveness of many medications, including antidepressants.  o TBI survivors are at increased risk of seizures.  Since alcohol lowers the seizure threshold, drinking can lead to increased risk of seizures.     Please feel free to call us at: [phone] option [phone] to speak with rehab nursing staff during business hours.      If a rehab clinic nurse is not available to answer, your phone will be forwarded to the call center [phone]).  Please ask to speak with at rehabilitation clinic nurse if you do get forwarded to them.    You can also use the "email" feature in [email or web address].    We want your care experience to be excellent every time - if you receive a survey in the mail please fill it out and mail it back.    Dr. Ferdinand LangoJunn appreciates the opportunity to work with you, your primary care provider and the rest of your health care team to work to optimize your function and quality of life.

## 2016-01-03 NOTE — Progress Notes (Signed)
Negative x ray of hand by report  Try to not stress the joints when opening cans or containers

## 2016-01-05 LAB — VITAMIN D (25 HYDROXY)
Vit D (25_Hydroxy) Total: 17.5 ng/mL — ABNORMAL LOW (ref 20.1–50.0)
Vitamin D2 (25_Hydroxy): 1.1 ng/mL
Vitamin D3 (25_Hydroxy): 16.4 ng/mL

## 2016-01-06 ENCOUNTER — Other Ambulatory Visit (INDEPENDENT_AMBULATORY_CARE_PROVIDER_SITE_OTHER): Payer: Self-pay | Admitting: Internal Medicine

## 2016-01-06 DIAGNOSIS — S069XAS Unspecified intracranial injury with loss of consciousness status unknown, sequela: Secondary | ICD-10-CM

## 2016-01-06 DIAGNOSIS — I619 Nontraumatic intracerebral hemorrhage, unspecified: Secondary | ICD-10-CM | POA: Insufficient documentation

## 2016-01-06 DIAGNOSIS — F09 Unspecified mental disorder due to known physiological condition: Secondary | ICD-10-CM | POA: Insufficient documentation

## 2016-01-06 DIAGNOSIS — E559 Vitamin D deficiency, unspecified: Secondary | ICD-10-CM

## 2016-01-06 DIAGNOSIS — S069X9S Unspecified intracranial injury with loss of consciousness of unspecified duration, sequela: Secondary | ICD-10-CM | POA: Insufficient documentation

## 2016-01-06 HISTORY — DX: Unspecified mental disorder due to known physiological condition: F09

## 2016-01-06 HISTORY — DX: Unspecified intracranial injury with loss of consciousness status unknown, sequela: S06.9XAS

## 2016-01-06 MED ORDER — VITAMIN D3 50 MCG (2000 UT) OR CAPS
2000.0000 [IU] | ORAL_CAPSULE | Freq: Every day | ORAL | 1 refills | Status: AC
Start: 2016-01-06 — End: ?

## 2016-01-06 NOTE — Progress Notes (Signed)
Vit d is very low at 617 , suspect why you feel achy more than ususal   Vit D is at 2000 IU per day - please start this it is over the counter

## 2016-01-07 ENCOUNTER — Telehealth (INDEPENDENT_AMBULATORY_CARE_PROVIDER_SITE_OTHER): Payer: Self-pay | Admitting: Internal Medicine

## 2016-01-07 NOTE — Telephone Encounter (Signed)
Ronnie LeatherwoodKatherine ( Warehouse managerspeech language pathologist) called stated that pt is having an elevated blood pressure today . Before she went to see the pt it was 153/104 and when she checked again it was 141/100 with pulse 90/.

## 2016-01-08 NOTE — Telephone Encounter (Signed)
He took himself off the amlodipine 10 mg last week  I asked that he restart this at 2.5 mg  Has he done so    If he has restarted the amlodipine 2.5 mg  Please ask him to go to 5 mg per day    Let me know what he is doing

## 2016-01-10 NOTE — Telephone Encounter (Signed)
Left message to call back  

## 2016-01-15 ENCOUNTER — Ambulatory Visit (INDEPENDENT_AMBULATORY_CARE_PROVIDER_SITE_OTHER): Payer: PPO | Admitting: Neurology

## 2016-01-15 ENCOUNTER — Telehealth (INDEPENDENT_AMBULATORY_CARE_PROVIDER_SITE_OTHER): Payer: Self-pay | Admitting: Internal Medicine

## 2016-01-15 VITALS — BP 130/98 | HR 96 | Ht 74.25 in | Wt 217.0 lb

## 2016-01-15 DIAGNOSIS — F1010 Alcohol abuse, uncomplicated: Secondary | ICD-10-CM

## 2016-01-15 DIAGNOSIS — G912 (Idiopathic) normal pressure hydrocephalus: Secondary | ICD-10-CM | POA: Insufficient documentation

## 2016-01-15 DIAGNOSIS — G250 Essential tremor: Secondary | ICD-10-CM

## 2016-01-15 DIAGNOSIS — Z8679 Personal history of other diseases of the circulatory system: Secondary | ICD-10-CM

## 2016-01-15 DIAGNOSIS — I619 Nontraumatic intracerebral hemorrhage, unspecified: Secondary | ICD-10-CM

## 2016-01-15 DIAGNOSIS — Q043 Other reduction deformities of brain: Secondary | ICD-10-CM

## 2016-01-15 DIAGNOSIS — S069X9D Unspecified intracranial injury with loss of consciousness of unspecified duration, subsequent encounter: Secondary | ICD-10-CM

## 2016-01-15 DIAGNOSIS — I10 Essential (primary) hypertension: Secondary | ICD-10-CM

## 2016-01-15 NOTE — Progress Notes (Signed)
[facility]  Physician: Milus Mallick, MD   Phone: [phone]  Today's date: 01/15/2016     HPI/CHIEF COMPLAINT:  Ronnie Mason is a 65 year old man, with history of alcohol dependence and hypertension, who returns for a follow up of essential tremor, and new complaints of a fall on 11/30/15 resulting in a hemorraghic stroke. Accompanied by his wife who helps provide history.    He was seen at [facility] ER on 11/30/15 after being found down outside of his house on his driveway. He had initial right-sided paralysis which resolved by the time he arrived to the hospital.Glucose in the field was 143. CT of the head noted a small punctate high left frontal hyperdensity concerning for possible contusion, and a 1 x 1 cm left thalamic hyperdensity consistent with a thalamic hemorrhage of hypertensive etiology.     During his hospitalization, he was seen in consult by Dr. 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 Mayford Knife for evaluation of possible normal pressure hydrocephalus. Has been to PT and OT.    His wife is concerned that he does not understand his blood pressure readings. He will follow up with Dr. Marisa Sprinkles and Dr. Aundria Rud.    He continues to have lower back pain and now complains of right 1st digit and right foot pain. The pain in his foot worsens with weight bearing. XR of the foot completed earlier this month was unremarkable. Wears a brace on his right hand. Interested in injection for pain.    History of alcohol dependence and hypertension.     Per his wife, he only drinks "nonalcoholic" beer now.      REVIEW OF SYSTEMS:  Neurological: See HPI  Cardiology: See HPI  Musculoskeletal: See HPI  Psych: See HPI      PHYSICAL EXAM:  Constitutional: This is a well-developed, pleasant male in no acute distress.  Vitals:    01/15/16 1523   BP: (!)  130/98   Pulse: 96   Weight: 217 lb (98.4 kg)   Height: 6' 2.25" (1.886 m)     Mental Status: Alert and oriented, with Grossly intact attention, concentration, memory, language and fund of knowledge.  Cranial Nerves: Oculomotor intact.  Diminished facial expressivity.  Mild right facial weakness with sparing of frontalis. No hearing loss.  Mild dysarthria.  Motor: No evident loss of dexterity in the hands.  No proximal weakness.Gait is circumducting on the right. There is no evident postural instability.    ASSESSMENT/PLAN:     ICD-10-CM ICD-9-CM    1. Intraparenchymal hemorrhage of brain (HCC) I61.9 431    2. Essential hypertension I10 401.9    3. Alcohol abuse F10.10 305.00    4. 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 Essential tremor G25.0 333.1    6. 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) G91.2 331.5      Recommendations as below.    Patient Instructions   Follow-up with Drs. Aundria Rud and [employer] regarding management of your blood pressure.  Also, I recommend you discuss your joint pains with Dr. Marisa Sprinkles.    Please follow-up as recommended by the neurologists at [facility] with Dr. Fransisca Connors.  He will be able to best manage evaluation of your apparent enlargement of the ventricles in the brain and help determine whether your problems with chronic imbalance are related to alcohol, a congenital disorder, or  dilatation of the ventricles increase in pressure.    I am happy to see you in follow-up after you have been evaluated for these problems.    Please make every effort to abstain from alcohol consumption.  I recommend that you participate in a formal alcohol cessation program.  Your continued success with abstinence will likely require counseling and probably medication.      Prior EMR records reviewed as available and clinically relevant. All questions answered. Discharge and follow up instructions were discussed with the patient and/or accompanying persons.        LAB RESULTS:  Office Visit on  01/02/16   1. CBC, DIFF   Result Value Ref Range    WBC 6.83 4.3 - 10.0 10*3/uL    RBC 4.28 (L) 4.40 - 5.60 10*6/uL    Hemoglobin 14.2 13.0 - 18.0 g/dL    Hematocrit 43 38 - 50 %    MCV 101 (H) 81 - 98 fL    MCH 33.2 27.3 - 33.6 pg    MCHC 32.9 32.2 - 36.5 g/dL    Platelet Count 161 096 - 400 10*3/uL    RDW-CV 12.0 11.6 - 14.4 %    % Neutrophils 54 %    % Lymphocytes 31 %    % Monocytes 11 %    % Eosinophils 3 %    % Basophils 1 %    % Immature Granulocytes 0 %    Neutrophils 3.68 1.80 - 7.00 10*3/uL    Absolute Lymphocyte Count 2.10 1.00 - 4.80 10*3/uL    Monocytes 0.76 0.00 - 0.80 10*3/uL    Absolute Eosinophil Count 0.22 0.00 - 0.50 10*3/uL    Basophils 0.05 0.00 - 0.20 10*3/uL    Immature Granulocytes 0.02 0.00 - 0.05 10*3/uL    Nucleated RBC 0.00 0.00 10*3/uL    % Nucleated RBC 0 %   2. COMPREHENSIVE METABOLIC PANEL   Result Value Ref Range    Sodium 138 135 - 145 meq/L    Potassium 4.5 3.6 - 5.2 meq/L    Chloride 104 98 - 108 meq/L    Carbon Dioxide, Total 25 22 - 32 meq/L    Anion Gap 9 4 - 12    Glucose 99 62 - 125 mg/dL    Urea Nitrogen 22 (H) 8 - 21 mg/dL    Creatinine 0.45 4.09 - 1.18 mg/dL    Protein (Total) 7.0 6.0 - 8.2 g/dL    Albumin 4.3 3.5 - 5.2 g/dL    Bilirubin (Total) 0.3 0.2 - 1.3 mg/dL    Calcium 9.4 8.9 - 81.1 mg/dL    AST (GOT) 19 9 - 38 U/L    Alkaline Phosphatase (Total) 81 37 - 159 U/L    ALT (GPT) 21 10 - 48 U/L    GFR, Calc, European American >60 mL/min/[1.73_m2]    GFR, Calc, African American >60 mL/min/[1.73_m2]    GFR, Information       Calculated GFR in mL/min/1.73 m2 by MDRD equation.  Inaccurate with changing renal function.  See http://depts.ThisTune.it.html   3. VITAMIN D (25 HYDROXY)   Result Value Ref Range    Vitamin D2 (25_Hydroxy) 1.1 ng/mL    Vitamin D3 (25_Hydroxy) 16.4 ng/mL    Vit D (25_Hydroxy) Total 17.5 (L) 20.1 - 50.0 ng/mL    Vit D (25_Hydroxy) Interp Deficient:         8.0-20.0 ng/mL    4. SED RATE   Result Value Ref Range    Erythrocyte  Sedimentation Rate 15 0 - 15 mm/h   5. CRP, HIGH SENSITIVITY   Result Value Ref Range    CRP, High Sensitivity 1.8 0.0 - 10.0 mg/L   6. CREATINE KINASE TOTAL ACTIVITY   Result Value Ref Range    Creatinine Kinase Total Activity 118 62 - 325 U/L   7. THYROID STIMULATING HORMONE   Result Value Ref Range    Thyroid Stimulating Hormone 1.127 0.400 - 5.000 u[IU]/mL   8. T4, FREE   Result Value Ref Range    Thyroxine (Free) 0.8 0.6 - 1.2 ng/dL       PROBLEM LIST:  Patient Active Problem List   Diagnosis    Syncope    Hypotension    Carotid Sinus Hypersensitivity    URI (upper respiratory infection)    Cervicalgia    Hyperlipidemia    Chronic pain    Bee sting allergy    Numbness and tingling of leg    Chronic back pain    B12 deficiency    Chronic renal insufficiency, stage III (moderate)    Back pain, lumbosacral    Lumbar radiculopathy, chronic    Neck pain    Fall    Lumbosacral radiculopathy at S1    Tremor    Sensory neuropathy    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 myelopathy or radiculopathy    Idiopathic peripheral neuropathy    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)    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)       PAST MEDICAL HISTORY:  Past Medical History:   Diagnosis Date    Carotid Sinus Hypersensitivity     Chronic kidney disease     History of EKG 5/97    HYPERTENSION      HYPOTENSION      Lipidemia     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 myelopathy or radiculopathy 05/28/2014    Syncope     URI (upper respiratory infection)        SURGICAL HISTORY:  Past Surgical History:   Procedure Laterality Date     ANES; COLONOSCOPY  2002    repeat in 3 years    ANES; COLONOSCOPY & POLYPECTOMY  11/18/2007    repeat in 3 years    FEMUR/KNEE SURG UNLISTED      HAND/FINGER SURGERY UNLISTED      SPINE SURGERY PROCEDURE UNLISTED  2013    rfa to c3 to c 7        MEDICATIONS:  Current Outpatient Prescriptions   Medication Sig Dispense Refill    Acetaminophen 500 MG Oral Tab 2 tablets bid      AmLODIPine Besylate 2.5 MG Oral Tab Take 1 tablet (2.5 mg) by mouth daily. 90 tablet 3    Atorvastatin Calcium 10 MG Oral Tab Take 1 tablet (10 mg) by mouth daily. 90 tablet 1    Cholecalciferol (VITAMIN D3) 2000 units Oral Cap Take 1 capsule (2,000 Units) by mouth daily. For low level 1 capsule 1    Clobetasol Propionate 0.05 % External Ointment Small amount to rash as needed bid for 1-2 weeks at a time 15 g 1  Cyanocobalamin 1000 MCG Oral Tab one per day over-the-counter started 5/21 /2012 this level borderline low 1 Tab 1    Diclofenac Sodium (VOLTAREN) 1 % Transdermal Gel Apply 4 g topically 4 times a day. Apply to neck, trapezius muscle, and low back. No more than 4 g applied at a time. 1 Tube 5    DULoxetine HCl 60 MG Oral CAPSULE ENTERIC COATED PARTICLES Take 1 capsule (60 mg) by mouth daily. 90 capsule 1    EPINEPHrine 0.3 MG/0.3ML Injection Solution Auto-injector Inject as instructed per patient package insert, 0.3 mg intramuscularly or subcutaneously into the thigh, if needed to treat anaphylaxis 2 Autoinjector 0    Glucosamine-Chondroitin (GLUCOSAMINE CHONDR COMPLEX OR)       Lidocaine 5 % External Patch       Lidocaine 5 % External Patch Apply 1 patch onto the skin daily. Apply to painful area for up to 12 hours in a 24 hour period. 30 patch 10    Lisinopril 20 MG Oral Tab Take 1 tablet (20 mg) by mouth every 12 hours. 180 tablet 1    Melatonin 3 MG Oral Tab 2 at night as needed for sleep 1 tablet 1    Primidone 50 MG Oral Tab 1 po bid 180 tablet 0    THIAMINE HCL OR       TURMERIC OR        No current  facility-administered medications for this visit.        ALLERGIES:  Review of patient's allergies indicates:  Allergies   Allergen Reactions    Bee Venom Hives, Itching and Swelling    Gabapentin      Flu like symptoms, diarrhea, body ache upset stomach    Lidocaine Rash     Lidocaine patch. PATIENT STATES HE IS ALLERGIC TO THE ADHESIVE NOT THE PATCH.       FAMILY HISTORY:  Family History   Problem Relation Age of Onset    Colon Cancer Father     Heart (other) Mother      MI    Other Family Hx Other      myelodysplasia       SOCIAL HISTORY:  Social History     Social History    Marital status: Married     Spouse name: N/A    Number of children: N/A    Years of education: N/A     Occupational History    Not on file.     Social History Main Topics    Smoking status: Never Smoker    Smokeless tobacco: Not on file    Alcohol use Yes    Drug use: No    Sexual activity: Not on file     Other Topics Concern    Not on file     Social History Narrative    ** Merged History Encounter **          Total time of greater than 25 minutes was spent of which greater than 50% was spent in coordination of care and counseling with the patient, as outlined in this note.    Portions of today's documentation have been created with the assistance of voice recognition software. Therefore, it may contain anomalous punctuation, inadvertent misrecognitions, word substitutions, insertions or omissions. Occasional wrong-word or sound-alike substitutions may also occur, all due to the inherent limitations of voice recognition software. Attempts to correct the above have been made by Dr. Miguel DibbleKirschner, but it is recommended that the chart be read  carefully to recognize, using context, where these substitutions may have occurred. Please notify us if a serious error or discrepancy is present so that it may be corrected or if there is confusion regarding this document.      Portions of this chart were written by medical scribe, Loletha Grayer, with oversight by Dr. Liz Beach. Kirschner on 01/15/2016 3:09 PM

## 2016-01-15 NOTE — Progress Notes (Signed)
The patient presents today for Tremor   Last visit: 10/09/2015  Vital complete:  YES  Care Everywhere records available/ reconciled:  YES  PHQ2 complete:  YES  Pharmacy complete: YES   Refills pended:  NO  Medications reviewed:  YES    Medical, surgical, family, and social updated:  YES  Health Maintenance updated:  YES  Recent imaging or hospital visit?:  YES (if yes=pull up images/documents)  Physician added to patient's care team:  YES

## 2016-01-15 NOTE — Progress Notes (Signed)
I, Marc A. Kirschner, MD, interviewed and examined the patient while overseeing the documentation performed by my Medical Scribe, Ulyana Tishchenko. I have reviewed and revised as necessary the scribe's note and agree with the documented findings and plan of care. Please refer to the scribe's note above for further detailed information regarding the patient encounter and exam.  01/15/2016. 6:19 PM

## 2016-01-15 NOTE — Telephone Encounter (Signed)
done

## 2016-01-15 NOTE — Telephone Encounter (Signed)
Please sign home health order pended.

## 2016-01-15 NOTE — Patient Instructions (Addendum)
Follow-up with Drs. Aundria RudWilkinson and [employer] regarding management of your blood pressure.  Also, I recommend you discuss your joint pains with Dr. Marisa SprinklesKraft.    Please follow-up as recommended by the neurologists at [facility] with Dr. Fransisca ConnorsMichael Williams.  He will be able to best manage evaluation of your apparent enlargement of the ventricles in the brain and help determine whether your problems with chronic imbalance are related to alcohol, a congenital disorder, or dilatation of the ventricles increase in pressure.    I am happy to see you in follow-up after you have been evaluated for these problems.    Please make every effort to abstain from alcohol consumption.  I recommend that you participate in a formal alcohol cessation program.  Your continued success with abstinence will likely require counseling and probably medication.

## 2016-01-16 NOTE — Telephone Encounter (Signed)
Faxed orders

## 2016-01-21 ENCOUNTER — Telehealth (INDEPENDENT_AMBULATORY_CARE_PROVIDER_SITE_OTHER): Payer: Self-pay | Admitting: Internal Medicine

## 2016-01-21 ENCOUNTER — Encounter (INDEPENDENT_AMBULATORY_CARE_PROVIDER_SITE_OTHER): Payer: Self-pay | Admitting: Internal Medicine

## 2016-01-21 NOTE — Telephone Encounter (Signed)
He should tell Dr [name] at visit and she is to decide on what to do   She is to make the call on this

## 2016-01-21 NOTE — Telephone Encounter (Signed)
Pt is having a procedure tomorrow; he is having a spinal injection. He called wanting to make sure that Dr.Kraft is ok with that given his past history. Please call back soon.

## 2016-01-21 NOTE — Telephone Encounter (Signed)
Called pt relayed PCP's ,message below, he understood and will be talking to Dr.Dale's MA

## 2016-01-22 ENCOUNTER — Ambulatory Visit (HOSPITAL_BASED_OUTPATIENT_CLINIC_OR_DEPARTMENT_OTHER): Payer: PPO | Attending: Anesthesiology | Admitting: Anesthesiology

## 2016-01-22 ENCOUNTER — Ambulatory Visit (INDEPENDENT_AMBULATORY_CARE_PROVIDER_SITE_OTHER): Payer: PPO | Admitting: Neurology

## 2016-01-22 DIAGNOSIS — M4696 Unspecified inflammatory spondylopathy, lumbar region: Secondary | ICD-10-CM | POA: Insufficient documentation

## 2016-01-22 DIAGNOSIS — M545 Low back pain: Secondary | ICD-10-CM

## 2016-01-23 ENCOUNTER — Encounter (HOSPITAL_BASED_OUTPATIENT_CLINIC_OR_DEPARTMENT_OTHER): Payer: Self-pay | Admitting: Rehabilitative and Restorative Service Providers"

## 2016-01-23 ENCOUNTER — Ambulatory Visit (HOSPITAL_BASED_OUTPATIENT_CLINIC_OR_DEPARTMENT_OTHER): Payer: PPO | Admitting: Speech-Language Pathologist

## 2016-01-23 ENCOUNTER — Ambulatory Visit: Payer: PPO | Attending: Neurology | Admitting: Rehabilitative and Restorative Service Providers"

## 2016-01-23 ENCOUNTER — Ambulatory Visit (HOSPITAL_BASED_OUTPATIENT_CLINIC_OR_DEPARTMENT_OTHER): Payer: PPO | Admitting: Rehabilitative and Restorative Service Providers"

## 2016-01-23 DIAGNOSIS — Z8679 Personal history of other diseases of the circulatory system: Secondary | ICD-10-CM | POA: Insufficient documentation

## 2016-01-23 DIAGNOSIS — R41841 Cognitive communication deficit: Secondary | ICD-10-CM

## 2016-01-23 DIAGNOSIS — M545 Low back pain, unspecified: Secondary | ICD-10-CM

## 2016-01-23 DIAGNOSIS — G8929 Other chronic pain: Secondary | ICD-10-CM | POA: Insufficient documentation

## 2016-01-23 DIAGNOSIS — M25512 Pain in left shoulder: Secondary | ICD-10-CM | POA: Insufficient documentation

## 2016-01-23 DIAGNOSIS — R2689 Other abnormalities of gait and mobility: Secondary | ICD-10-CM | POA: Insufficient documentation

## 2016-01-23 DIAGNOSIS — M25551 Pain in right hip: Secondary | ICD-10-CM

## 2016-01-23 DIAGNOSIS — I619 Nontraumatic intracerebral hemorrhage, unspecified: Secondary | ICD-10-CM | POA: Insufficient documentation

## 2016-01-23 DIAGNOSIS — M5489 Other dorsalgia: Secondary | ICD-10-CM | POA: Insufficient documentation

## 2016-01-23 DIAGNOSIS — M25511 Pain in right shoulder: Secondary | ICD-10-CM | POA: Insufficient documentation

## 2016-01-23 HISTORY — DX: Pain in right hip: M25.551

## 2016-01-23 NOTE — Progress Notes (Signed)
Occupational Therapy Evaluation    The following patient identifiers were confirmed today: name and date of birth.    General Assessment  Reason for Referral: 65 year old male with L thalamic ICH    Onset: 11/30/15  Visits from start of Episode: 1    Referring Provider: Mikey Bussingavis, Arielle Patricia*       Treatment/Therapy diagnosis:   Encounter Diagnosis   Name Primary?   . Intraparenchymal hemorrhage of brain (HCC) Yes     Precautions: none    PERTINENT MEDICAL/SURGICAL HISTORY:   Past Medical History:   Diagnosis Date   . Carotid Sinus Hypersensitivity    . Chronic kidney disease    . History of EKG 5/97   . HYPERTENSION     . HYPOTENSION     . Lipidemia    . 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 Syncope    . URI (upper respiratory infection)         Past Surgical History:   Procedure Laterality Date   . ANES; COLONOSCOPY  2002    repeat in 3 years   . ANES; COLONOSCOPY & POLYPECTOMY  11/18/2007    repeat in 3 years   . FEMUR/KNEE SURG UNLISTED     . HAND/FINGER SURGERY UNLISTED     . SPINE SURGERY PROCEDURE UNLISTED  2013    rfa to c3 to c 7        Previous interventions: Discharged to home on 12/10/15 with intermittent supervision from his wife and started home PT, OT, and SLP.  Has been participating well with the therapies.       Home Environment: Ronnie Mason lives with his wife Ronnie Mason in a 3 story home including basement (where the laundry, office and bathroom are). Master bath has tub/shower combination. He uses a walk in shower in the bathroom he uses and has a standard height toilet there. His wife has an ADA toilet in the master bath. His wife works 3-4 days per week and provides intermittent supervision.    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 Ronnie Mason has two adult children that live in the area and 2 grandchildren    Fall Risk: The patient fell on 11/30/15 which led to his hospitilization but none since then.     SUBJECTIVE:   Patient Statement: Ronnie Mason  arrived on time and accompanied by his wife Ronnie Mason today.   He states that [place] he is not currently doing because he can't drive. He is having difficulty with sleeping: sleeping more during the day than he typically does and feels very sleepy during the day.      "I'm fatiguing very quickly." Pain also limits him at this time.     The patient is reporting 5-6/10 level of pain now, on a 0-10 Numeric Pain Scale. Location is in right foot and right thumb. Swelling and pain has gone down in his right thumb. He occasionally wears a hand-based thumb splint.    Aggravating factors: walking long distances. Alleviating factors: ice, rest. Also has decades of chronic back pain. Having pain in both shoulders during active ROM into shoulder abduction.     Barriers to learning: memory                   Preferred Learning style: seeing    OBJECTIVE MEASURES/IMPAIRMENTS:    Upper Extremity Eval:     B UE [facility]OM [facility]  Pain during right shoulder abduction, pain during left shoulder flexion    Coordination: finger to nose test normal bilaterally though left (non-dominant hand) slightly less accurate         Hole Peg Test: R hand: 22.22 seconds (M=20.3, SD=2.6)      L hand: 23.62 seconds (M=21.0, SD=2.5)    Lower Extremity Eval:  numbness in the lateral calf to the bottom of the feet per report    Cognition/Visual Perceptual  Attention: alert  Orientation: not evaluated  Memory: some difficulty remembering  Spouse has reported decreased insight into deficits    Basic Activities of Daily Living   Functional Transfers: independent  Functional Mobility: supervision with ambulation into clinic  Intermittent supervision with ADL due to impulsivity and difficulty with reasoning.    Instrumental Activities of Daily Living  Driving/Transportation: not driving  Pet Care: takes care of his dog     TREATMENT & EDUCATION:    Interventions/Treatment provided:      Occupational Therapy evaluation      2. Therapeutic activities for 25 minutes  Introduced patient to outpatient scope of practice, provided brief TBI education and established goals.     ASSESSMENT: Ronnie Mason is a 65 year old man with complicated-mild TBI with SDH, left thalamic IPH and facial fractures who presents to outpatient OT with deficits in activity tolerance, coordination, and cognition. These deficits subsequently impact his independence with ADL and IADL. He will likely benefit from continued outpatient OT to progress stated goals below.     These goals were discussed and the patient actively participated in developing them.     DISCHARGE GOALS      Functional Discharge     Goals Short Term Goals Current Level of Function /   Progress Toward Goal Test/Outcome Measure Score & Date   1. Patient will report increased activity tolerance / productivity / IADL participation within 3 months' time.    Patient will report increased activity tolerance / productivity / IADL participation within 2 months' time.  Goal established today.  Patient report    COPM     2. Patient will complete home exercise program aimed to manage pain, increase UE strength and coordination within 3 months' time.  Initiate HEP for B UEs. Goal established today.  9 hole peg test     Plan of care:   This patient will be seen for 1 therapy sessions per week for 4-6 weeks.     The therapy will include: Self care/ADL Management, Therapeutic Activities, Therapeutic Exercise, Cognitive Skills Development    Maxie BarbMary Pat Dodge, OTR/L

## 2016-01-23 NOTE — Progress Notes (Signed)
Physical Therapy Evaluation and Initial Plan of Care  This note serves as a CMS evaluation form that requires the referring provider to certify the need for therapy services furnished under this Plan of Treatment. The signing provider is certifying the plan of care.    The following patient identifiers were confirmed today: name and date of birth.    Certification From: 01/23/16  Certification To: 04/22/16    Date of Symptom Onset : 11/30/15  Start of Care Date: 01/23/16    Visits from Start of Episode: 1    Referring Provider: Davis, Arielle Patricia*    NPI: [id number]  Reason for Referral: 64 year old male with hypertensive L thalamic ICH.   Treatment/Therapy diagnosis:   Encounter Diagnoses   Name Primary?   . Intraparenchymal hemorrhage of brain (HCC) Yes   . Back pain, lumbosacral    . Balance problem    . Chronic pain of both shoulders    . Pain of right hip joint      Precautions: None          PERTINENT MEDICAL/SURGICAL HISTORY:   Past Medical History:   Diagnosis Date   . Carotid Sinus Hypersensitivity    . Chronic kidney disease    . History of EKG 5/97   . HYPERTENSION     . HYPOTENSION     . Lipidemia    . Pain of right hip joint 01/23/2016   . 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 Syncope    . URI (upper respiratory infection)         Past Surgical History:   Procedure Laterality Date   . ANES; COLONOSCOPY  2002    repeat in 3 years   . ANES; COLONOSCOPY & POLYPECTOMY  11/18/2007    repeat in 3 years   . FEMUR/KNEE SURG UNLISTED     . HAND/FINGER SURGERY UNLISTED     . SPINE SURGERY PROCEDURE UNLISTED  2013    rfa to c3 to c 7        Previous interventions: Home health PT finished a couple a weeks ago.      Prior Level of Function: Before the onset of the current condition, the patient was able to perform all his mobility independently.    Home Environment: Pt lives in [place] with his wife and dog.     Equipment: Cane-but does not use    Have you fallen in the  past year?: Yes  Are you afraid of falling?: No  Fall Screen Date: 01/23/16  The patient has a moderate risk for falls and this note will be forwarded to the referring physician.  Pt has fallen 5x in the past year.  This is due to tripping or getting pulled by the dog.    INITIAL EVALUATION SUBJECTIVE:   Patient Statement: Pt arrives on time with [name] his wife.  She works part time so he has intermittant supervision.  He reports that he has also been having R hip pain that the therapist has been working on.  The MD advised him to see a podiatrist for his foot pain because they are unsure of what is causing it. He was doing a lot of weights for his arms but has stopped due to the pain it would cause.    Current HEP: stretching, walking, weights for his arms    The patient is reporting Current Level of Pain: 5 - Moderate pain/10 level of pain, on a   0-10 Numeric Pain Distress Scale. Pain location is R hand and foot and pt describes the pain as aching and throbbing.  .  He may be seeing a podiatrist soon.     Aggravating Factors: walking    Relieving Factors: resting, medication    Barriers to learning: issues with short term memory  Preferred Learning style: seeing    Interpreter Status: No    OBJECTIVE MEASURES/IMPAIRMENTS:  There were no vitals filed for this visit.  -Attention/Cognition/Speech: Difficulty with short term memory which has improved since going home.   -Posture: forward head, rounded shoulders, R shoulder elevated compared to L,falttening in his lumbar spine.   -Sensation: Intact to light touch per report except numbness in the lateral calf to the bottom of the feet  -Tone assessment: (Measured via MAS): 0/4 in B hamstrings, quads, and calves    LE Strength: 01/23/2016    L LE (_/5) R LE (_/5)   Hip Flexion 4 4- (gives away due to hip pain)   Hip Ext 4 4   Hip Abduction 4 4   Knee Extension 5 5   Knee Flexion 5 5   Ankle Dorsiflexion 4 4     UE Strength: 01/23/2016    L UE (_/5) R UE (_/5)   Shoulder  Abduction 4- (c/o shoulder pain) 4- (c/o shoulder pain)   Shoulder Flexion 4 4   Shoulder IR 5 5   Shoulder ER 4 4   Elbow Flexion 5 5   Elbow Extension 5 5     Range of Motion:  -Slight tightness in hamstring and hip flexors  -Otherwise WFL    -Ober's test positive on R    -Functional Mobility:    -Bed mobility: Independent with increased time due to back and shoulder pain    -Transfers: Independent     -Gait: good foot clearance, narrow BOS, decreased arm swing on the R compared to the L     -Balance:   Mini Balance Evaluation Systems Test (Mini BEST test)    1. Sit to stand: 2  2. Rise to toes: 1  3. Stand on one leg: 1  Left: 1  Right: 1  4. Compensatory stepping correction - forward: 1  5. Compensatory stepping correction - backward:0  6. Compensatory stepping correction - lateral: 0  Left: 0  Right: 1  7. Stance (feet together); eyes open, firm surface: 2  8. Stance (feet together); eyes closed, foam surface: 2  9. Incline - Eyes closed: 2  10.  Change in gait speed: 2  11.  Walk with head turns - horizontal: 1  12.  Walk with pivot turns: 2  13.  Step over obstacles: 2  14.  Timed up and go with dual task: 1  TUG: 08.59  TUG with dual task: 10.35    Initial Score:  19/28 (01/23/2016)  Most recent score:      Score interpretation:   Chronic Stroke - (Tsang et al 2013): Scores of ? 17.5 identified those with a history of falling, sensitivity 64%, specificity 64% AUC 0.64  Parkinson's Disease: - Scores of ? 19 identified recurrent fallers; sensitivity 79%, specificity 67% AUC 0.75 (King et al 2012). Scores of ? 21 differentiate those with and without postural response deficits with a sensitivity of 89%, specificity 81%    Modified CTSIB    1. Stand on firm surface with eyes open: 30/30 sec  2. Stand on firm surface with eyes closed: 30/30 sec  3. Stand   on foam with eyes open: 30/30 sec  4. Stand on foam with eyes closed: 30/30 sec (significant increase in sway)    Initial score.  120  out of 120 sec  (01/23/2016)  Most recent score:   out of 120 sec    INITIAL EVALUATION ASSESSMENT: Ronnie Mason is a 64 year old male status post IPH in April 2017 who presents to physical therapy with sensations deficits, weakness, impaired balance and decreased functional mobility.  His case is also complicated by his hx of shoulder and back pain that has been exacerbated since his IPH.  As demonstrated by his MiniBEST test the pt is at increased risk for falls having increased difficulty with his dynamic gait and reactive balance. These impairments are limiting his ability to maintain normal balance for functional mobility as well as impaired gait kinematics and increasing the likelihood for falls, and require physical assistance for safety.  He would benefit from skill Physical Therapy to address the above stated impairments.  Recommend the following plan of care:    Rehabilitation potential is: Good  Potential Barriers to achieve rehab goals: decreased short term memory, co-morbidities  Facilitators to achieve rehab goals: motivated, good support system.     TREATMENT & EDUCATION:  Time in: 0815  Total Treatment time/minutes: 45    Interventions/Treatment provided:     1. High Complexity for 30 minutes PT evaluation    2. Therapeutic exercise for 10 minutes   -Went over his stretches.    -Added a hip flexor stretch with leg hanging over the edge of the bed with contralateral hip flexion.  C/o initial back pain but improved.     Home exercise instruction: verbal instructions and demonstrations were provided for the above exercises  a handout was provided describing the exercises in written and picture format  The patient was able to demonstrate the exercises after instructions.    -Hip flexor stretch    ASSESSMENT OF TREATMENT PROVIDED TODAY: Pt responded well to addition to his stretching program.      DISCHARGE GOALS  to be met by:  04/22/2016  Patient Goals: Improve his balance     Functional Discharge      Goals Short Term Goals  Impairments Current Level of Function /   Progress Toward Goal   1. Pt will improve his MiniBEST test to 23/28 to demonstrate improved balance.     2. Pt will be report his pain in his shoulder on average being a 2/10.     3. Pt will be able to sit with good posture without cueing. A. Pt will be independent in his HEP.     B. Pt will be able to perform dual task DGI with only a 10% time difference between each other. Weakness   Decreased sensation  Decreased balance Initial evaluation completed and initial stretching done     These goals were discussed and the patient agrees with them.    PLAN OF CARE:  This patient will be seen for 1 therapy sessions per week over a 90 day time frame.  The patient will perform the instructed home program 2-3 times per day.  The therapy will include therapeutic exercise, manual therapy, ultrasound, therapeutic activities, neuromuscular reeducation and gait training    Intervention for next treatment:   -BP and aerobic exercise   -Core strengthening and stabilization exercises   -Follow up on stretching program  -Address shoulder pain  -Progress dynamic balance  -TENS unit for back pain?  -Address   his IT band.  -Educate on safety with posture.    Catherine Kieu PT, DPT  [facility]   Physical Therapy   [phone] (Voicemail)  [phone] (Pager)     Referring Physician/Practitioner Certification Statement:   I certify the need for these services furnished under this plan of treatment and while under my care.      Signature:__________________________________Date:___________________

## 2016-01-23 NOTE — Progress Notes (Signed)
Speech Therapy Evaluation and Initial Plan of Care    The following patient identifiers were confirmed today: name and date of birth.    General Assessment  Date of Symptom Onset : 11/30/15  Start of Care Date: 01/23/16  Reason for Referral: cognitive communication impairment  Pain Score: 0 - No pain  Have you fallen in the past year?: Yes  Fall Screen Date: 01/23/16  Interpreter Status: Not Needed      Visit from start of care: 1    Referring Provider: Mikey Bussing*      Treatment/Therapy diagnosis:   Encounter Diagnoses   Name Primary?   [name] Hx of spontan intraparenchymal intracran bleed assoc with hypertension Yes   . Cognitive communication deficit          PERTINENT MEDICAL/SURGICAL HISTORY:   Past Medical History:   Diagnosis Date   . Carotid Sinus Hypersensitivity    . Chronic kidney disease    . History of EKG 5/97   . HYPERTENSION     . HYPOTENSION     . Lipidemia    . Pain of right hip joint 01/23/2016   . 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 Syncope    . URI (upper respiratory infection)         Past Surgical History:   Procedure Laterality Date   . ANES; COLONOSCOPY  2002    repeat in 3 years   . ANES; COLONOSCOPY & POLYPECTOMY  11/18/2007    repeat in 3 years   . FEMUR/KNEE SURG UNLISTED     . HAND/FINGER SURGERY UNLISTED     . SPINE SURGERY PROCEDURE UNLISTED  2013    rfa to c3 to c 7      Pt is a 65 y/o gentleman who sustained a mild-complicated TBI with left thalamic IPH, SDH and facial fractures on 11/30/2015 following a fall.  He was He participated in IPR at [facility] and was discharged to home with his wife on 12/10/2015.  He continued to receive home health for OT, PT, and speech therapy services.  He also has a history of syncope, chronic back and neck pain, essential tremor, demyelinating peripheral neuropathy and HTN.    Previous interventions: He was seen by speech therapy while an inpatient and continued with home health.  Therapy focused on  strategies for memory, attention, executive function, and communication.  He has made good gains in therapy and continued outpatient speech therapy was recommended at this time.    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  He was accompanied by his wife, [name], who was present for the entire session.  He reports that he is interested in continuing to work to improve cognitive skills that were affected as a result of his TBI.    Education  Engineer, production: Patient  Preferred Language: English  Education Preference: Demonstration (hands on)  Barriers to Education: Cognitive  Cultural practices that influence care: None    OBJECTIVE MEASURES/IMPAIRMENTS:   Speech/language: Croy reports that he initially noted decreased word retrieval and provided a specific example of when he was given feedback on this, but that he does not note this being a problem.  His wife does note that he continues to  have difficulty with word retrieval in conversation and will often use a word that is not quite accurate for the meaning that he is trying to communicate.  He required intermittent cues during the evaluation to effectively communicate his intent.  He also required cues to redirect to original question as he became tangential in answering that.      Cognition:  Onalee HuaDavid is using a day timer to keep a record of what he is doing each day as well as keeping track of upcoming appointments.  He had worked on this strategy with home health Doctor, general practicespeech pathologist and feels that he is becoming more efficient with this.  He initially took out paper for our session and attempted to take notes, but was not able to sustain attention to writing his notes and they ended up being more confusing when he looked at them.  On an  informal auditory memory task patient immediately recalled 6/10 words following 3 trials.  Following a 25 minute delay, he was unable to spontaneously recall any of the word, but was able to recognize 4/5 of the words with 1 false positive.  On the [employer] Story Recall Test, the patient immediately recalled 5/21 details (adjusted score 9) and only recalled vague details (e.g. "lots of people got hurt") 2/21 following a 20 minute delay (norm 13 for immediate recall and 15 for delayed recall).  He reports that he is following some basic routines during the day which helps to keep him on track.  He completed subtest 1 (planning an event) from the [facility] and scored 2/5 for accuracy (<1st percentile), 4/5 for rationale (15th percentile), and in the 11th percentile for speed.  He was able to recognize that his response was not accurate, but required cues to sustain attention and narrow focus to section of page that contained the correct response.  Once cued to that section, he was able to make an accurate decision from choice of 2.    TREATMENT & EDUCATION:  Time in:10:00  Duration: 60 minutes     Interventions/Treatment provided:    Standardized Cognitive Performance Test, per hr    Reviewed results of evaluation and goals of therapy.  Provided education regarding continued use of strategies as well as continuing to practice tasks in a book on mild cognitive impairment that his daughter gave him.    DISCHARGE GOALS:      Goal #1: Pt will develop and utilize strategies for increased recall of daily information with minimal to no cues.  Time frame:  April 02, 2016  Progress toward goal:  Pt has been working on developing strategies and is making progress with using a dayplanner.  He requires continued treatment to address use of more effective notes for increased recall.    Goal #2: Pt will develop and utilize strategies for improved attention and problem solving with minimal cues in moderately complex reasoning  tasks.  Time frame:  April 02, 2016  Progress toward goal:  Pt required mod/max cues to complete complex reasoning task today.      ASSESSMENT: Pt is a very pleasant gentleman who presents with moderate cognitive/communication difficulties characterized by decreased memory, attention, and problem solving.  Prognosis for improvement is good based upon progress to date and family support.      Rehabilitation potential is: Good  Potential Barriers to achieve rehab goals: memory  These goals were discussed and the patient agrees with them.    Plan of care:   This patient will be seen  for 1 therapy sessions per week for 6-10 weeks.      Intervention for next treatment: develop and train in strategies for memory, attention, and problem solving    Raynelle FanningJulie A Corlis Angelica, MS, CCC-SLP, BC-ANCDS

## 2016-01-28 ENCOUNTER — Ambulatory Visit (HOSPITAL_BASED_OUTPATIENT_CLINIC_OR_DEPARTMENT_OTHER): Payer: PPO | Admitting: Speech-Language Pathologist

## 2016-01-28 DIAGNOSIS — Z8679 Personal history of other diseases of the circulatory system: Secondary | ICD-10-CM

## 2016-01-28 DIAGNOSIS — R41841 Cognitive communication deficit: Secondary | ICD-10-CM

## 2016-01-28 NOTE — Progress Notes (Signed)
Speech Therapy Progress Note    The following patient identifiers were confirmed today: name and date of birth.    General Assessment  Date of Symptom Onset : 11/30/15  Start of Care Date: 01/23/16  Reason for Referral: cognitive communication impairment  Pain Score: 0 - No pain  Have you fallen in the past year?: Yes  Fall Screen Date: 01/23/16  Interpreter Status: Not Needed      Visits from start of care: 2    Referring Provider: Mikey Bussingavis, Arielle Patricia*    Treatment/Therapy diagnosis:   Encounter Diagnoses   Name Primary?   [name]. 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 Cognitive communication deficit          SUBJECTIVE:   Patient Statement: Ronnie Mason was a few minutes late to the appointment today because his wife had the wrong time written in her calendar.  However, Ronnie Mason reports that he had the correct time in his planner and he saw this and initiated that they needed to leave.  He participated well in the session today.  He has been trying to figure out a bus route that he can take to come to therapy, but needs to further research the times related to when he has his appointments.                       TREATMENT & EDUCATION:   Time in: 8:21     Duration: 53 minutes     Interventions/Treatment provided:    Cognitive skills Tx    Memory:  Reviewed use of day planner for keeping track of appointments.  He was very pleased that he had the correct information for today's appointment and was able to correct what his wife had written down.  He will continue to use this system.  Began to address more complex note taking for increased recall and follow through of novel information.  Practiced listening to short paragraphs and then taking a summary note of the information under two different conditions: taking notes simultaneously while listening to the story or listening and then writing a summary note.  He required mod/max cues under both conditions to completely comprehend and retain  the information.  He verbalized good understanding of difficulty and was anxious to continue to practice this skill.    Attention/planning/organizing:  Completed task to determine names of streets on a map based upon written clues on a separate page.  He required moderate cues to develop and implement strategies for improved attention to detail and required min to min/mod cues overall for managing information as it became more complex or lengthy.      ASSESSMENT OF TREATMENT: Ronnie Mason verbalized good understanding of strategies that were taught and practiced today for increased note taking as well as improved attention in complex reasoning.  He benefits from use of strategies to focus on one item at a time and minimize external distractions.  He is aware of difficulties with attention and memory and is anxious to improve note-taking as well as other cognitive skills.       DISCHARGE GOALS:    Goal #1: Pt will develop and utilize strategies for increased recall of daily information with minimal to no cues.  Time frame:  April 02, 2016  Progress toward goal:  Began to develop and train in strategies for improved recall and follow through.    Goal #2: Pt will develop and utilize strategies for improved attention and  problem solving with minimal cues in moderately complex reasoning tasks.  Time frame:  April 02, 2016  Progress toward goal:  Began to develop and train in strategies for improved attention and problem solving.        Rehabilitation potential is: Good  Potential Barriers to achieve rehab goals: memory  These goals were discussed and the patient agrees with them.    Plan of care: Ronnie Mason was given several different types of homework tasks to work on over the next week.  This included listening to short TED talks and then writing a brief summary of what he could recall.  He was instructed to then listen to the talk a second time to see how he did with recall and note taking.  He will return the therapy next  week to continue POT.        Zenaida DeedJulie A Haralambos Yeatts, MS, CCC-SLP, BC-ANCDS

## 2016-01-29 NOTE — Progress Notes (Signed)
Does patient have 3 or more complaints:  NO  If yes, discuss with patient the need to schedule a follow up appointment due to limited appointment time.  Follow up appointment scheduled:  NO  Care Everywhere records available/ reconciled:  NO  PHQ2 complete:  NO  PHQ9 complete:  NO  Refills pended:  NO  Orders pended:  NO  Medications to discontinue:  NO    Health Maintenance updated:  NO    Health Maintenance   Topic Date Due   . Diabetes Screen  01/02/2019   . Cholesterol Test  09/11/2020   . Colonoscopy  09/24/2020   . Tetanus Vaccine  12/22/2022   . Zoster Vaccine  Completed   . Influenza Vaccine  Completed   . Hepatitis C Screen  Addressed   . HIV Screen  Addressed

## 2016-01-29 NOTE — Progress Notes (Signed)
Ronnie Mason is a 65 year old male     Review of patient's allergies indicates:  Allergies   Allergen Reactions   . Bee Venom Hives, Itching and Swelling   . Gabapentin      Flu like symptoms, diarrhea, body ache upset stomach   . Lidocaine Rash     Lidocaine patch. PATIENT STATES HE IS ALLERGIC TO THE ADHESIVE NOT THE PATCH.       BP 130/80   Pulse 90   Ht 6' 2.25" (1.886 m)   Wt (!) 219 lb 3.2 oz (99.4 kg)   SpO2 97%   BMI 27.95 kg/m     Chief Complaint   Patient presents with   . Follow-Up      on alcohol abuse       HPI  History of:    pcp [name], MD   HM needed :  None     He is also followed by neurology Dr. 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 Valetta Mole -Patient should also be following up with Dr. 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 Elder Cyphers at [facility] January 22, 2016-Nerve block of the facet joint/medial branch blocks targeted to L3-L4, L4-L5, and L5-S1 facet joints (left) for chronic low back pain suspected to be facet origin-       Last visit w/ me 01/02/2016 - vitamin D was very low at 17 suspect contributing to achiness    Today follow-up above issues:     Back pain - injected went well and good x 1 since seeing Dr Amada Jupiter, pending final injection - 7/19 - pain pain down both less with recent injection so hopeful   Neck pain  - gone    Hand pain  - 1 wk after home form hospital right side- base of thumb - has use Voltaren    Foot pain - x started prior to hospital , right toe - x ray at hospital not fracture          Accession: [id number] Completion Date: 04-Dec-2015 11:46 Requested By: Lonn Georgia MD, Pin-Yi  XR FOOT 3VIEWS RIGHT    Diagnosis:  swollen tender R foot (1st MCP) s/p fall, r/o fracture    EXAMINATION:  XR FOOT 3VIEWS RIGHT    CLINICAL INDICATION:  swollen tender R foot (1st MCP) s/p fall, r/o fracture  Re x ray 01/2016 negative          Alcohol use  - not drinking - non alcohol beer       Cognitive impairment  - better per patient    PT OT - ot at [facility], done w home health  This week [place] pt /ot /speech- tue saw speech , pt starting,and ot in a month   Vit d deficiency - did start recent , hope to help w some bodyache    bp - not great , in am - report , speech therapist 130/86 138/80 , last night 160/103 - home bp cuff matches the home health      Office Visit on 01/02/16   1. CBC,  DIFF   Result Value Ref Range    WBC 6.83 4.3 - 10.0 10*3/uL    RBC 4.28 (L) 4.40 - 5.60 10*6/uL    Hemoglobin 14.2 13.0 - 18.0 g/dL    Hematocrit 43 38 - 50 %    MCV 101 (H) 81 - 98 fL    MCH 33.2 27.3 - 33.6 pg    MCHC 32.9 32.2 - 36.5 g/dL    Platelet Count 161 096 - 400 10*3/uL    RDW-CV 12.0 11.6 - 14.4 %    % Neutrophils 54 %    % Lymphocytes 31 %    % Monocytes 11 %    % Eosinophils 3 %    % Basophils 1 %    % Immature Granulocytes 0 %    Neutrophils 3.68 1.80 - 7.00 10*3/uL    Absolute Lymphocyte Count 2.10 1.00 - 4.80 10*3/uL    Monocytes 0.76 0.00 - 0.80 10*3/uL    Absolute Eosinophil Count 0.22 0.00 - 0.50 10*3/uL    Basophils 0.05 0.00 - 0.20 10*3/uL    Immature Granulocytes 0.02 0.00 - 0.05 10*3/uL    Nucleated RBC 0.00 0.00 10*3/uL    % Nucleated RBC 0 %   2. COMPREHENSIVE METABOLIC PANEL   Result Value Ref Range    Sodium 138 135 - 145 meq/L    Potassium 4.5 3.6 - 5.2 meq/L    Chloride 104 98 - 108 meq/L    Carbon Dioxide, Total 25 22 - 32 meq/L    Anion Gap 9 4 - 12    Glucose 99 62 - 125 mg/dL    Urea Nitrogen 22 (H) 8 - 21 mg/dL    Creatinine 0.45 4.09 - 1.18 mg/dL    Protein (Total) 7.0 6.0 - 8.2 g/dL    Albumin 4.3  3.5 - 5.2 g/dL    Bilirubin (Total) 0.3 0.2 - 1.3 mg/dL    Calcium 9.4 8.9 - 81.1 mg/dL    AST (GOT) 19 9 - 38 U/L    Alkaline Phosphatase (Total) 81 37 - 159 U/L    ALT (GPT) 21 10 - 48 U/L    GFR, Calc, European American >60 mL/min/[1.73_m2]    GFR, Calc, African American >60 mL/min/[1.73_m2]    GFR, Information       Calculated GFR in mL/min/1.73 m2 by MDRD equation.  Inaccurate with changing renal function.  See http://depts.[email or web address].it.html   3. VITAMIN D (25 HYDROXY)   Result Value Ref Range    Vitamin D2 (25_Hydroxy) 1.1 ng/mL    Vitamin D3 (25_Hydroxy) 16.4 ng/mL    Vit D (25_Hydroxy) Total 17.5 (L) 20.1 - 50.0 ng/mL    Vit D (25_Hydroxy) Interp Deficient:         8.0-20.0 ng/mL    4. SED RATE   Result Value Ref Range    Erythrocyte Sedimentation Rate 15 0 - 15 mm/h   5. CRP, HIGH SENSITIVITY   Result Value Ref Range    CRP, High Sensitivity 1.8 0.0 - 10.0 mg/L   6. CREATINE KINASE TOTAL ACTIVITY   Result Value Ref Range    Creatinine Kinase Total Activity 118 62 - 325 U/L   7. THYROID STIMULATING HORMONE   Result Value Ref Range    Thyroid Stimulating Hormone 1.127 0.400 - 5.000 u[IU]/mL   8. T4, FREE   Result Value Ref Range    Thyroxine (Free) 0.8 0.6 - 1.2 ng/dL         MEDICATION PRIOR TO VISIT  Reports   Outpatient Medications Prior to Visit   Medication Sig Dispense Refill   . Acetaminophen 500 MG Oral Tab 2 tablets bid     . AmLODIPine Besylate 2.5 MG Oral Tab Take 1 tablet (2.5 mg) by mouth daily. 90 tablet 3   . Atorvastatin Calcium 10 MG Oral Tab Take 1 tablet (10 mg) by mouth daily. 90 tablet 1   . Cholecalciferol (VITAMIN D3) 2000 units Oral Cap Take 1 capsule (2,000 Units) by mouth daily. For low level 1 capsule 1   . Clobetasol Propionate 0.05 % External Ointment Small amount to rash as needed bid for 1-2 weeks at a time 15 g 1   . Cyanocobalamin 1000 MCG Oral Tab one per day over-the-counter started 5/21 /2012 this level borderline low 1 Tab 1   . Diclofenac Sodium  (VOLTAREN) 1 % Transdermal Gel Apply 4 g topically 4 times a day. Apply to neck, trapezius muscle, and low back. No more than 4 g applied at a time. 1 Tube 5   . DULoxetine HCl 60 MG Oral CAPSULE ENTERIC COATED PARTICLES Take 1 capsule (60 mg) by mouth daily. 90 capsule 1   . EPINEPHrine 0.3 MG/0.3ML Injection Solution Auto-injector Inject as instructed per patient package insert, 0.3 mg intramuscularly or subcutaneously into the thigh, if needed to treat anaphylaxis 2 Autoinjector 0   . Glucosamine-Chondroitin (GLUCOSAMINE CHONDR COMPLEX OR)      . Lidocaine 5 % External Patch      . Lidocaine 5 % External Patch Apply 1 patch onto the skin daily. Apply to painful area for up to 12 hours in a 24 hour period. 30 patch 10   . Lisinopril 20 MG Oral Tab Take 1 tablet (20 mg) by mouth every 12 hours. 180 tablet 1   . Melatonin 3 MG Oral Tab 2 at night as needed for sleep 1 tablet 1   . Primidone 50 MG Oral Tab 1 po bid 180 tablet 0   . THIAMINE HCL OR      . TURMERIC OR        No facility-administered medications prior to visit.          REVIEW OF SYSTEMS - are otherwise negative unless as noted in HPI above or as additional issues below    Constitutional:     Eyes:    Head ears, nose mouth throat:     Cardiovascular:   Respiratory:     Gastrointestinal:   Genitourinary:      Musculoskeletal:  The right toe pain  His left hand is improving base of thumb  Integumentary:     Neurological:   Psychiatric/Behavioral: He complains of not sleeping but his wife said he slept all day      Endocrine:     Hematological:   Allergic/Immunologic:        PHYSICAL EXAM  BP 130/80   Pulse 90   Ht 6' 2.25" (1.886 m)   Wt (!) 219 lb 3.2 oz (99.4 kg)   SpO2 97%   BMI 27.95 kg/m     Well Dressed    Constitution:  In general looks:   well , good coloring  No diaphoresis    Patient is Alert  & Attentive  Patient is conversive    Psychiatric Mental Status / Neurologic Mental Status:  Affect:  Pleasant   Calm  Mood:  Good  Awake: Alert and  attentive      Eyes: no discharge, no gross puffiness, non-  icteric      Neck:    Thyroid not enlarged  2+ carotid pulses no bruits    Cardiovascular:   Heart Regular rate , rhythm, no murmur  No edema    Lymph nodes:  No gross cervical swelling  No gross axillary swelling    Respiratory:   Not tachyphpnic  Clear to ascultation  No rales, rhonchi , without increased effort or stridor  No clubbing         Musculoskeletal:  No gross abnormal movement, twitch   No gross atrophy or deformity    There is no gross deformity in his base of thumb or toe joint right foot    Skin:  No gross lesion or rash    Neurologic:   Grossly intact  Memory:   Attention       Cranial Nerve    Eyes pupil and lid are intact   II, III, IV, VIII   Facial Movement VII: grossly intact, no facial droop     Motor- Gait-coordination: get up and go normal, no gross weakness   Gait- base normal appropriate for age   Get up and go -no problem      Stereotactic          Foot exam:  Above    Hematologic/Lymphatic /Immunologic:  No gross bruising or rash or hives       IMPRESSION / PLAN / DISCUSSION     Onalee HuaDavid was seen today for follow-up .    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, with loss of consciousness of unspecified duration, subsequent encounter    Essential hypertension  -     AmLODIPine Besylate 2.5 MG Oral Tab; 1 tablet bid dose increase 01/30/2016    Chronic bilateral low back pain without sciatica    Alcohol abuse  -     REFERRAL TO SLEEP DISORDER [facility]    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)    Vitamin D deficiency    Right hand pain    Bee sting allergy  -     EPINEPHrine 0.3 MG/0.3ML Injection Solution Auto-injector; Inject as instructed per patient package insert, 0.3 mg intramuscularly or subcutaneously into the thigh, if needed to treat anaphylaxis    Right foot drop    Excessive  sleepiness  -     REFERRAL TO ORTHOPEDICS  -     REFERRAL TO SLEEP DISORDER [facility]    Snoring  -     REFERRAL TO SLEEP DISORDER [facility]    Other orders  -     Cancel: XR HAND 3+ VW RIGHT  -     Cancel: REFERRAL TO ORTHOPEDICS      Patient is status post intra-parenchymal bleed in April    Patient is to continue ongoing PT OT speech as outpatient  He is to follow-up with Dr. Fransisca ConnorsMichael Williams neurology and then follow-up with Dr. Miguel DibbleKirschner    He would like to very much start driving and he is not ready  And he really needs to do OT once -he has been released to drive by time 6 months  And it's not clear to me when he could drive again but right now he is not ready as he has a lot of recovery left to do    Continuing ongoing treatment with Dr. Andris Flurryale-his neck is not having any difficulty his back  he is hopeful that the ablation will help and that appointment is coming as the recent injection noted markedly less pain    On right hand pain-it is better and he points to the base of the thumb x-ray was negative    Not quite sure why his foot is in so much pain referral to podiatry  For both of these he has been using Voltaren gel    He is now off narcotics    He really should see a sleep specialist I wonder if he even has a little bit of apnea going  He currently states that he goes to bed at 10 and frequently wakes up at 3 lays there and tell his wife goes to work maybe week up again later and then take an afternoon nap    His wife stated that he slept all day yesterday and is why he didn't sleep well last night and he snores    He is now no longer drinking    Epinephrine for bee sting allergy    We discussed how it could even take even a year to recover from his recent trauma and time will tell but he has to maintain good health diet stay away from pain medication/stay away from alcohol  Better control his blood pressure  Work with speech physical therapy OT  And see neurology  And that there may not be one thing that makes it  worked the best up but all of it together     He will be seeing Dr. Aundria Rud in one month's  He will be seeing me in 2 weeks to focus mainly on his blood pressure control and I have made it so he can take his amlodipine at 2.5 mg twice a day as in the past he took himself off the 10 mg      MEDICATION as of Discharge  Current Outpatient Prescriptions   Medication Sig Dispense Refill   . Acetaminophen 500 MG Oral Tab 2 tablets bid     . AmLODIPine Besylate 2.5 MG Oral Tab 1 tablet bid dose increase 01/30/2016 180 tablet 3   . Atorvastatin Calcium 10 MG Oral Tab Take 1 tablet (10 mg) by mouth daily. 90 tablet 1   . Cholecalciferol (VITAMIN D3) 2000 units Oral Cap Take 1 capsule (2,000 Units) by mouth daily. For low level 1 capsule 1   . Clobetasol Propionate 0.05 % External Ointment Small amount to rash as needed bid for 1-2 weeks at a time 15 g 1   . Cyanocobalamin 1000 MCG Oral Tab one per day over-the-counter started 5/21 /2012 this level borderline low 1 Tab 1   . Diclofenac Sodium (VOLTAREN) 1 % Transdermal Gel Apply 4 g topically 4 times a day. Apply to neck, trapezius muscle, and low back. No more than 4 g applied at a time. 1 Tube 5   . DULoxetine HCl 60 MG Oral CAPSULE ENTERIC COATED PARTICLES Take 1 capsule (60 mg) by mouth daily. 90 capsule 1   . EPINEPHrine 0.3 MG/0.3ML Injection Solution Auto-injector Inject as instructed per patient package insert, 0.3 mg intramuscularly or subcutaneously into the thigh, if needed to treat anaphylaxis 3 each 1   . Glucosamine-Chondroitin (GLUCOSAMINE CHONDR COMPLEX OR)      . Lidocaine 5 % External Patch Apply 1 patch onto the skin daily. Apply to painful area for up to 12 hours in a 24 hour period. 30 patch 10   . Lisinopril 20 MG Oral Tab Take  1 tablet (20 mg) by mouth every 12 hours. 180 tablet 1   . Melatonin 3 MG Oral Tab 2 at night as needed for sleep 1 tablet 1   . Primidone 50 MG Oral Tab 1 po bid 180 tablet 0   . THIAMINE HCL OR      . TURMERIC OR        No  current facility-administered medications for this visit.          Risk of taking medication properly and follow up discussed especially to call if problem    Patient   Family Member - His wife  express understanding of care plan/medications    I spent a total time of 40 minutes face-to-face with the patient, of which more than 50% was spent counseling and coordinating care as outlined in this note.    Return in about 2 weeks (around 02/13/2016).     Cc to following MD:                ++++++++++++++++++++++++++++++++++++++++++++++++++        Patient Active Problem List    Diagnosis Date Noted   . Balance problem [R26.89] 01/23/2016   . Chronic pain of both shoulders [M25.512, G89.29, M25.511] 01/23/2016   . Pain of right hip joint [M25.551] 01/23/2016   . 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 Essential hypertension [I10] 12/16/2015   . Essential tremor [G25.0] 12/16/2015   . Hx of spontan intraparenchymal intracran bleed assoc with hypertension [Z86.79] 12/16/2015     [place] 4/29- 12/10/2015  Non-traumatic intraparenchymal hemorrhage, hypertensive of the left thalamus   Traumatic hemorrhage   -- falcine subdural hemorrhage, small cortical subarachnoid     . Alcohol abuse [F10.10] 12/16/2015   . 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 History of alcohol dependence (HCC) [F10.21] 06/26/2015   . 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 Idiopathic peripheral neuropathy [G60.9] 03/15/2015   . 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 Tremor [R25.1] 04/20/2013   . Lumbosacral radiculopathy at S1 [M54.17] 03/22/2013     Left     . Fall  [W19.XXXA] 02/23/2013     BP < 100  Occasioanal tri[p and leg weakness     . Back pain, lumbosacral [M54.5, M54.89] 02/15/2013   . Lumbar radiculopathy, chronic [M54.16] 02/15/2013   . Neck pain [M54.2] 02/15/2013   . Chronic pain [G89.29] 12/21/2012     [facility], [place], [place] Gladebecca Capen    Neck pain -burn c3-7 on left side per patient  Some trigger injection    Lumbar pain-since 80's better with exercise     . 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 Numbness and tingling of leg [R20.2] 12/21/2012     Left calf -left foot long term suspect from back-suspect L 45     . Chronic back pain [M54.9, G89.29] 12/21/2012     Mri in mid scape 1/214  Multilevel degenerative disc disease of the lumbar spine. Circumferential disc bulge at all levels below and including L1-2. No significant central spinal canal stenosis or neuroforaminal narrowing.  2. Mild retrolisthesis of L5 on S1.           .[name]  B12 deficiency [E53.8] 12/21/2012     Borderline low 5 /21/2014     . Chronic renal insufficiency, stage III (moderate) [N18.3] 12/21/2012     5/ 2013     . Hyperlipidemia [E78.5] 09/26/2012   . Cervicalgia [M54.2] 03/11/2010     Radiofrequency ablation of c3 to c 7     . Syncope [R55]    . Hypotension [I95.9]    . 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 URI (upper respiratory infection) [J06.9]          Past Medical History:   Diagnosis Date   . Carotid Sinus Hypersensitivity    . Chronic kidney disease    . History of EKG 5/97   . HYPERTENSION     . HYPOTENSION     . Lipidemia    . Pain of right hip joint 01/23/2016   . 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 Syncope    . URI (upper respiratory infection)        Social History     Social History   . Marital status: Married     Spouse name: N/A   . Number of children: N/A   . Years of education: N/A     Occupational History   . Not on file.     Social History Main Topics   . Smoking status: Never Smoker   . Smokeless  tobacco: Not on file   . Alcohol use Yes   . Drug use: No   . Sexual activity: Not on file     Other Topics Concern   . Not on file     Social History Narrative    ** Merged History Encounter **              Family History   Problem Relation Age of Onset   . Colon Cancer Father    . Heart (other) Mother      MI   . Other Family Hx Other      myelodysplasia       Social History     Social History   . Marital status: Married     Spouse name: N/A   . Number of children: N/A   . Years of education: N/A     Social History Main Topics   . Smoking status: Never Smoker   . Smokeless tobacco: Not on file   . Alcohol use Yes   . Drug use: No   . Sexual activity: Not on file     Other Topics Concern   . Not on file     Social History Narrative    ** Merged History Encounter **            Health Maintenance   Topic Date Due   . Diabetes Screen  01/02/2019   . Cholesterol Test  09/11/2020   . Colonoscopy  09/24/2020   . Tetanus Vaccine  12/22/2022   . Zoster Vaccine  Completed   . Influenza Vaccine  Completed   . Hepatitis C Screen  Addressed   . HIV Screen  Addressed

## 2016-01-30 ENCOUNTER — Ambulatory Visit (HOSPITAL_BASED_OUTPATIENT_CLINIC_OR_DEPARTMENT_OTHER): Payer: PPO | Admitting: Rehabilitative and Restorative Service Providers"

## 2016-01-30 ENCOUNTER — Encounter (INDEPENDENT_AMBULATORY_CARE_PROVIDER_SITE_OTHER): Payer: Self-pay | Admitting: Internal Medicine

## 2016-01-30 ENCOUNTER — Ambulatory Visit (INDEPENDENT_AMBULATORY_CARE_PROVIDER_SITE_OTHER): Payer: PPO | Admitting: Internal Medicine

## 2016-01-30 VITALS — BP 130/80 | HR 90 | Ht 74.25 in | Wt 219.2 lb

## 2016-01-30 DIAGNOSIS — S069X9D Unspecified intracranial injury with loss of consciousness of unspecified duration, subsequent encounter: Secondary | ICD-10-CM

## 2016-01-30 DIAGNOSIS — M79641 Pain in right hand: Secondary | ICD-10-CM

## 2016-01-30 DIAGNOSIS — M545 Low back pain, unspecified: Secondary | ICD-10-CM

## 2016-01-30 DIAGNOSIS — M25551 Pain in right hip: Secondary | ICD-10-CM

## 2016-01-30 DIAGNOSIS — G8929 Other chronic pain: Secondary | ICD-10-CM

## 2016-01-30 DIAGNOSIS — G912 (Idiopathic) normal pressure hydrocephalus: Secondary | ICD-10-CM

## 2016-01-30 DIAGNOSIS — Z91030 Bee allergy status: Secondary | ICD-10-CM

## 2016-01-30 DIAGNOSIS — I10 Essential (primary) hypertension: Secondary | ICD-10-CM

## 2016-01-30 DIAGNOSIS — Z91038 Other insect allergy status: Secondary | ICD-10-CM

## 2016-01-30 DIAGNOSIS — M21371 Foot drop, right foot: Secondary | ICD-10-CM

## 2016-01-30 DIAGNOSIS — R0683 Snoring: Secondary | ICD-10-CM

## 2016-01-30 DIAGNOSIS — F1010 Alcohol abuse, uncomplicated: Secondary | ICD-10-CM

## 2016-01-30 DIAGNOSIS — G4710 Hypersomnia, unspecified: Secondary | ICD-10-CM

## 2016-01-30 DIAGNOSIS — M25511 Pain in right shoulder: Secondary | ICD-10-CM

## 2016-01-30 DIAGNOSIS — I619 Nontraumatic intracerebral hemorrhage, unspecified: Secondary | ICD-10-CM

## 2016-01-30 DIAGNOSIS — E559 Vitamin D deficiency, unspecified: Secondary | ICD-10-CM

## 2016-01-30 DIAGNOSIS — R2689 Other abnormalities of gait and mobility: Secondary | ICD-10-CM

## 2016-01-30 DIAGNOSIS — Z8679 Personal history of other diseases of the circulatory system: Secondary | ICD-10-CM

## 2016-01-30 MED ORDER — AMLODIPINE BESYLATE 2.5 MG OR TABS
ORAL_TABLET | ORAL | 3 refills | Status: DC
Start: 2016-01-30 — End: 2016-07-10

## 2016-01-30 MED ORDER — EPINEPHRINE 0.3 MG/0.3ML IJ SOAJ
INTRAMUSCULAR | 1 refills | Status: DC
Start: 2016-01-30 — End: 2018-04-22

## 2016-01-30 NOTE — Progress Notes (Signed)
Physical Therapy Progress Note  This note serves as a CMS evaluation form that requires the referring provider to certify the need for therapy services furnished under this Plan of Treatment. The signing provider is certifying the plan of care.    The following patient identifiers were confirmed today: name and date of birth.    Certification From: 29/56/21  Certification To: 30/86/57    Date of Symptom Onset : 11/30/15  Start of Care Date: 01/23/16    Visits from Start of Episode: 2    Referring Provider: Royce Macadamia*    NPI: [id number]  Reason for Referral: 65 year old male with hypertensive L thalamic ICH.   Treatment/Therapy diagnosis:   Encounter Diagnoses   Name Primary?   . Intraparenchymal hemorrhage of brain ([place]) Yes   . Back pain, lumbosacral    . Balance problem    . Chronic pain of both shoulders    . Pain of right hip joint      Precautions: None          PERTINENT MEDICAL/SURGICAL HISTORY:   Past Medical History:   Diagnosis Date   . Carotid Sinus Hypersensitivity    . Chronic kidney disease    . History of EKG 5/97   . HYPERTENSION     . HYPOTENSION     . Lipidemia    . Pain of right hip joint 01/23/2016   . 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 Syncope    . URI (upper respiratory infection)         Past Surgical History:   Procedure Laterality Date   . ANES; COLONOSCOPY  2002    repeat in 3 years   . ANES; COLONOSCOPY & POLYPECTOMY  11/18/2007    repeat in 3 years   . FEMUR/KNEE SURG UNLISTED     . HAND/FINGER SURGERY UNLISTED     . SPINE SURGERY PROCEDURE UNLISTED  2013    rfa to c3 to c 7      OBJECTIVE MEASURES  -Attention/Cognition/Speech: Difficulty with short term memory which has improved since going home.   -Posture: forward head, rounded shoulders, R shoulder elevated compared to L,falttening in his lumbar spine.   -Sensation: Intact to light touch per report except numbness in the lateral calf to the bottom of the feet  -Tone  assessment: (Measured via MAS): 0/4 in B hamstrings, quads, and calves    LE Strength: 01/23/2016    L LE (_/5) R LE (_/5)   Hip Flexion 4 4- (gives away due to hip pain)   Hip Ext 4 4   Hip Abduction 4 4   Knee Extension 5 5   Knee Flexion 5 5   Ankle Dorsiflexion 4 4     UE Strength: 01/23/2016    L UE (_/5) R UE (_/5)   Shoulder Abduction 4- (c/o shoulder pain) 4- (c/o shoulder pain)   Shoulder Flexion 4 4   Shoulder IR 5 5   Shoulder ER 4 4   Elbow Flexion 5 5   Elbow Extension 5 5     Range of Motion:  -Slight tightness in hamstring and hip flexors  -Otherwise WFL    -Ober's test positive on R    -Functional Mobility:    -Bed mobility: Independent with increased time due to back and shoulder pain    -Transfers: Independent     -Gait: good foot clearance, narrow BOS, decreased arm swing on the R compared  to the L     -Balance:   Mini Balance Evaluation Systems Test (Mini BEST test)    1. Sit to stand: 2  2. Rise to toes: 1  3. Stand on one leg: 1  Left: 1  Right: 1  4. Compensatory stepping correction - forward: 1  5. Compensatory stepping correction - backward:0  6. Compensatory stepping correction - lateral: 0  Left: 0  Right: 1  7. Stance (feet together); eyes open, firm surface: 2  8. Stance (feet together); eyes closed, foam surface: 2  9. Incline - Eyes closed: 2  10.  Change in gait speed: 2  11.  Walk with head turns - horizontal: 1  12.  Walk with pivot turns: 2  13.  Step over obstacles: 2  14.  Timed up and go with dual task: 1  TUG: 08.59  TUG with dual task: 10.35    Initial Score:  19/28 (01/23/2016)  Most recent score:      Score interpretation:   Chronic Stroke - Francie Massing et al 2013): Scores of ? 17.5 identified those with a history of falling, sensitivity 64%, specificity 64% AUC 0.64  Parkinson's Disease: - Scores of ? 19 identified recurrent fallers; sensitivity 79%, specificity 67% AUC 0.75 (King et al 2012). Scores of ? 21 differentiate those with and without postural response deficits with a  sensitivity of 89%, specificity 81%    Modified CTSIB    1. Stand on firm surface with eyes open: 30/30 sec  2. Stand on firm surface with eyes closed: 30/30 sec  3. Stand on foam with eyes open: 30/30 sec  4. Stand on foam with eyes closed: 30/30 sec (significant increase in sway)    Initial score.  120  out of 120 sec (01/23/2016)  Most recent score:   out of 120 sec  ______________________________________________________________________________    01/30/2016    TREATMENT & EDUCATION:  Time in: 1103  Total Treatment time/minutes: 57    SUBJECTIVE: He will be getting a procedure done in his low back for pain. His shoulders have continued to bug him.   Have you fallen since your last visit? No  Pain: low back and foot 4-6/10      1. Therapeutic exercise for 35 minutes   -Pt has added the leg stretches to his program which is slightly uncomfortable but does not increase his back pain     -AROM in degrees:   R shoulder flexion: 105   L shoulder flexion: 75   R shoulder abduction: 110   L shoulder abduction: 75    -PROM in degrees:   R shoulder flexion: 120    L shoulder flexion: 130   R shoulder abduction: 115   L shoulder abduction: 75     -Attempted different shoulder stretches and ROM exercises but pt having significant pain with them.  Attempted supine shoulder AAROM for shoulder flexion, table slides, and pulleys but pt c/o increased pain.   -Wall angles with shoulder retraction 10x.   -Shoulder ER with red band.  10x.   -Shoulder Rows with a red band. 10x.   -Shoulder punches with a red band. 10x.   -Cervical side bend stretch.  30 sec hold.     2. Manual Therapy for 10 minutes   -STM to upper trap and levator  -Posterior GH glide but pt c/o pain  -Gentle traction to B shoulders    3. Therapeutic activity for 10 minutes  -Discussed proper posture in sitting  and standing.  Pt has rounded shoulders that close down the space in his shoulder joint.  Went over what positions will help improve his ROM and decrease his  pain such as ER his shoulder to open up the joint.     Home exercise instruction: verbal instructions and demonstrations were provided for the above exercises  a handout was provided describing the exercises in written and picture format  The patient was able to demonstrate the exercises after instructions.    -Hip flexor stretch  -Cervical side bend stretch    -Wall angles with shoulder retraction .   -Shoulder ER with red band.    -Shoulder Rows with a red band.   -Shoulder punches with a red band. .      ASSESSMENT OF TREATMENT PROVIDED TODAY:Pt limited in today's session focusing on his shoulder.  Pt was very irritable with his exercises due to his shoulder pain.  Suspect that he has either impingement and/or a rotator cuff tear.  Able to add exercises to his HEP but pt needs to be careful of his form due to pain.     DISCHARGE GOALS  to be met by:  04/22/2016  Patient Goals: Improve his balance     Functional Discharge      Goals Short Term Goals Impairments Current Level of Function /   Progress Toward Goal   1. Pt will improve his MiniBEST test to 23/28 to demonstrate improved balance.     2. Pt will be report his pain in his shoulder on average being a 2/10.     3. Pt will be able to sit with good posture without cueing. A. Pt will be independent in his HEP.     B. Pt will be able to perform dual task DGI with only a 10% time difference between each other. Weakness   Decreased sensation  Decreased balance 1. Not addressed.     2. Worked on his shoulder today.     3. Needing cueing throughout session.     A. Initiated    B. Not addressed     These goals were discussed and the patient agrees with them.    PLAN OF CARE:  This patient will be seen for 1 therapy sessions per week over a 90 day time frame.  The patient will perform the instructed home program 2-3 times per day.  The therapy will include therapeutic exercise, manual therapy, ultrasound, therapeutic activities, neuromuscular reeducation and gait  training    Intervention for next treatment:   -BP and aerobic exercise   -Core strengthening and stabilization exercises   -Follow up on stretching program  -Address shoulder pain  -Progress dynamic balance  -Address his IT band.  -Educate on safety with posture.    Morley Kos PT, DPT  [facility] Rehabilitation Department   Physical Therapy   [phone] (Pager)     Referring Physician/Practitioner Certification Statement:   I certify the need for these services furnished under this plan of treatment and while under my care.      Signature:__________________________________Date:___________________

## 2016-02-07 ENCOUNTER — Other Ambulatory Visit (HOSPITAL_BASED_OUTPATIENT_CLINIC_OR_DEPARTMENT_OTHER): Payer: Self-pay | Admitting: Physical Medicine & Rehabilitation

## 2016-02-07 ENCOUNTER — Ambulatory Visit: Payer: PPO | Attending: Neurology | Admitting: Speech-Language Pathologist

## 2016-02-07 DIAGNOSIS — I619 Nontraumatic intracerebral hemorrhage, unspecified: Secondary | ICD-10-CM

## 2016-02-07 DIAGNOSIS — R2689 Other abnormalities of gait and mobility: Secondary | ICD-10-CM | POA: Insufficient documentation

## 2016-02-07 DIAGNOSIS — G8929 Other chronic pain: Secondary | ICD-10-CM | POA: Insufficient documentation

## 2016-02-07 DIAGNOSIS — M25512 Pain in left shoulder: Secondary | ICD-10-CM | POA: Insufficient documentation

## 2016-02-07 DIAGNOSIS — Z8679 Personal history of other diseases of the circulatory system: Secondary | ICD-10-CM | POA: Insufficient documentation

## 2016-02-07 DIAGNOSIS — S069X9D Unspecified intracranial injury with loss of consciousness of unspecified duration, subsequent encounter: Secondary | ICD-10-CM

## 2016-02-07 DIAGNOSIS — M545 Low back pain: Secondary | ICD-10-CM | POA: Insufficient documentation

## 2016-02-07 DIAGNOSIS — R41841 Cognitive communication deficit: Secondary | ICD-10-CM

## 2016-02-07 DIAGNOSIS — M25511 Pain in right shoulder: Secondary | ICD-10-CM | POA: Insufficient documentation

## 2016-02-07 DIAGNOSIS — M5489 Other dorsalgia: Secondary | ICD-10-CM | POA: Insufficient documentation

## 2016-02-07 DIAGNOSIS — M25551 Pain in right hip: Secondary | ICD-10-CM | POA: Insufficient documentation

## 2016-02-07 NOTE — Progress Notes (Signed)
Speech Therapy Progress Note    The following patient identifiers were confirmed today: name and date of birth.    General Assessment  Date of Symptom Onset : 11/30/15  Start of Care Date: 01/23/16  Reason for Referral: cognitive communication impairment  Pain Score: 0 - No pain  Have you fallen in the past year?: Yes  Fall Screen Date: 01/23/16  Interpreter Status: Not Needed      Visits from start of care: 3    Referring Provider: Mikey Bussingavis, Arielle Patricia*    Treatment/Therapy diagnosis:   Encounter Diagnoses   Name Primary?   [name]. 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 Cognitive communication deficit          SUBJECTIVE:   Patient Statement: Ronnie Mason was on time to the appointment and participated well throughout the session.  He was accompanied by his wife, who was present for the entire session and took notes to help Ronnie Mason follow through with specific strategies.  Ronnie Mason indicated that the past week was overwhelming as they went to their cabin and had their young grandchildren (ages 1 and 3) there as well.  Provided education regarding managing stimuli and impact of over stimulation such as increased irritability and fatigue.                       TREATMENT & EDUCATION:   Time in: 10:00 am     Duration: 55 minutes     Interventions/Treatment provided:    Cognitive skills Tx    Memory:  Reviewed strategies for memory such as note taking and planning.  Ronnie Mason reports that he did try to complete the homework to practice note taking, but only wrote out a main idea with the plan to go back and listen to the information again.  Provided feedback on strategies to focus on one item at a time rather than trying to take in multiple ideas.  Practiced listening to stories from Discourse Comprehension Test and summarizing key information.  Provided education in strategies to pause and organize ideas before verbalizing to increase comprehension and storage.  Answering questions related to  stories with 60% accuracy initially and increased to 85% accuracy using strategies.  Prompted to take a note to summarize homework and strategies.  He required min/mod cues to write complete notes necessary for recall and follow through.      Attention/planning/organizing:  Discussed benefit of creating structured weekly schedule.  Provided template and education for scheduling tasks each day.  He will work on this over the next week.        ASSESSMENT OF TREATMENT: Ronnie Mason verbalized good understanding of strategies that were taught and practiced today for increased note taking as well as improved attention in complex reasoning.  He benefits from use of strategies to focus on one item at a time and minimize external distractions.  He is aware of difficulties with attention and memory and is anxious to improve note-taking as well as other cognitive skills.       DISCHARGE GOALS:    Goal #1: Pt will develop and utilize strategies for increased recall of daily information with minimal to no cues.  Time frame:  April 02, 2016  Progress toward goal:  Continued to develop and train in strategies for improved recall and follow through.    Goal #2: Pt will develop and utilize strategies for improved attention and problem solving with minimal cues in moderately complex reasoning tasks.  Time frame:  April 02, 2016  Progress toward goal:  Continued to develop and train in strategies for improved attention and problem solving.        Rehabilitation potential is: Good  Potential Barriers to achieve rehab goals: memory  These goals were discussed and the patient agrees with them.    Plan of care: Ronnie Mason will work on making a written plan for each day using the weekly template provided today.  He will also work on listening to one TED talk over the weekend and practice note taking. He will return the therapy next week to continue POT.        Ronnie DeedJulie A Shirly Bartosiewicz, MS, CCC-SLP, BC-ANCDS

## 2016-02-10 ENCOUNTER — Ambulatory Visit (HOSPITAL_BASED_OUTPATIENT_CLINIC_OR_DEPARTMENT_OTHER): Payer: PPO | Admitting: Speech-Language Pathologist

## 2016-02-10 ENCOUNTER — Ambulatory Visit (HOSPITAL_BASED_OUTPATIENT_CLINIC_OR_DEPARTMENT_OTHER): Payer: PPO | Admitting: Rehabilitative and Restorative Service Providers"

## 2016-02-10 DIAGNOSIS — I619 Nontraumatic intracerebral hemorrhage, unspecified: Secondary | ICD-10-CM

## 2016-02-10 DIAGNOSIS — R41841 Cognitive communication deficit: Secondary | ICD-10-CM

## 2016-02-10 DIAGNOSIS — R2689 Other abnormalities of gait and mobility: Secondary | ICD-10-CM

## 2016-02-10 DIAGNOSIS — M545 Low back pain, unspecified: Secondary | ICD-10-CM

## 2016-02-10 DIAGNOSIS — G8929 Other chronic pain: Secondary | ICD-10-CM

## 2016-02-10 DIAGNOSIS — Z8679 Personal history of other diseases of the circulatory system: Secondary | ICD-10-CM

## 2016-02-10 NOTE — Progress Notes (Signed)
Speech Therapy Progress Note    The following patient identifiers were confirmed today: name and date of birth.    General Assessment  Date of Symptom Onset : 11/30/15  Start of Care Date: 01/23/16  Reason for Referral: cognitive communication impairment  Pain Score: 0 - No pain  Have you fallen in the past year?: Yes  Fall Screen Date: 01/23/16  Interpreter Status: Not Needed      Visits from start of care: 4    Referring Provider: Mikey Bussing*    Treatment/Therapy diagnosis:   Encounter Diagnoses   Name Primary?   [name] Hx of spontan intraparenchymal intracran bleed assoc with hypertension Yes   . Cognitive communication deficit          SUBJECTIVE:   Patient Statement: Ronnie Mason was on time to the appointment and participated well throughout the session.  He had his homework assignments with him today and spent time discussing that.  He indicated that he would like to use his daily planner to track information rather than the weekly template that was given to him last week.  He had followed through with listening to several Felicity Pellegrini talks and had written notes for 2.  He was surprised how long it took him and the amount of information that he had misinterpreted when he went back to listen to the talk a second time.                       TREATMENT & EDUCATION:   Time in: 10:00 am     Duration: 55 minutes     Interventions/Treatment provided:    Cognitive skills Tx    Memory:  Reviewed notes that he had taken for homework tasks.  He was able to organize his notes more effectively by reviewing the information and then rewriting it.  Reviewed use of day planner versus weekly template for keeping track of tasks and appointments.  He indicated that he had been writing more information in his planner while working with home health therapist and felt that had been beneficial for him.  However, he has not been writing as much over the past few weeks after discontinuing that therapy.  Reviewed what he should be writing in  his planner and developed system to also incorporate a to do list that he can pull from in the book as well.       Attention/planning/organizing:  Completed several deductive reasoning puzzles.  He required moderate cues on the first task to utilize strategies to determine specific area of focus and then maintain that focus as well as utilizing notes consistently.  On the second puzzle, he required only intermittent cues to utilize these strategies.          ASSESSMENT OF TREATMENT: Ronnie Mason verbalized good understanding of strategies that were taught and practiced today for increased note taking as well as improved attention in complex reasoning.  He benefits from use of strategies to focus on one item at a time and minimize external distractions.  He is aware of difficulties with attention and memory and is anxious to improve note-taking as well as other cognitive skills.       DISCHARGE GOALS:    Goal #1: Pt will develop and utilize strategies for increased recall of daily information with minimal to no cues.  Time frame:  April 02, 2016  Progress toward goal:  Continued to develop and train in strategies for improved recall and follow through.  Goal #2: Pt will develop and utilize strategies for improved attention and problem solving with minimal cues in moderately complex reasoning tasks.  Time frame:  April 02, 2016  Progress toward goal:  Continued to develop and train in strategies for improved attention and problem solving.        Rehabilitation potential is: Good  Potential Barriers to achieve rehab goals: memory  These goals were discussed and the patient agrees with them.    Plan of care: Ronnie Mason will work on using his daily planner and to do lists for making a plan each day and then following that plan.  He will also continue to practice listening to 2-3 TED talks over the week and practice note taking. He will return the therapy next week to continue POT.        Zenaida DeedJulie A Horald Birky, MS, CCC-SLP,  BC-ANCDS

## 2016-02-10 NOTE — Patient Instructions (Signed)
Balance Exercises:      *Perform this in the corner for safety.    -Feet together.  Eyes closed.  Horizontal or vertical head turns.     -Staggered stance.  Toes pointed in the same direction.  If you feel like you can do this safely for 30 seconds with your eyes open.  Try doing it with your eyes closed.    -[place] walking: Over-out-behind-out.  You should be working on your balance when you cross your feet.  Do this along a counter where you can hold onto something.     -Heel toe walking along a line.

## 2016-02-10 NOTE — Progress Notes (Signed)
Physical Therapy Progress Note  This note serves as a CMS evaluation form that requires the referring provider to certify the need for therapy services furnished under this Plan of Treatment. The signing provider is certifying the plan of care.    The following patient identifiers were confirmed today: name and date of birth.    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   Treatment/Therapy diagnosis:   Encounter Diagnoses   Name Primary?   . Intraparenchymal hemorrhage of brain ([place]) Yes   . Back pain, lumbosacral    . Balance problem    . Chronic pain of both shoulders      Precautions: None          PERTINENT MEDICAL/SURGICAL HISTORY:   Past Medical History:   Diagnosis Date   . Carotid Sinus Hypersensitivity    . Chronic kidney disease    . History of EKG 5/97   . HYPERTENSION     . HYPOTENSION     . Lipidemia    . Pain of right hip joint 01/23/2016   . 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 Syncope    . URI (upper respiratory infection)         Past Surgical History:   Procedure Laterality Date   . ANES; COLONOSCOPY  2002    repeat in 3 years   . ANES; COLONOSCOPY & POLYPECTOMY  11/18/2007    repeat in 3 years   . FEMUR/KNEE SURG UNLISTED     . HAND/FINGER SURGERY UNLISTED     . SPINE SURGERY PROCEDURE UNLISTED  2013    rfa to c3 to c 7      OBJECTIVE MEASURES  -Attention/Cognition/Speech: Difficulty with short term memory which has improved since going home.   -Posture: forward head, rounded shoulders, R shoulder elevated compared to L,falttening in his lumbar spine.   -Sensation: Intact to light touch per report except numbness in the lateral calf to the bottom of the feet  -Tone assessment: (Measured via MAS): 0/4 in  B hamstrings, quads, and calves    LE Strength: 01/23/2016    L LE (_/5) R LE (_/5)   Hip Flexion 4 4- (gives away due to hip pain)   Hip Ext 4 4   Hip Abduction 4 4   Knee Extension 5 5   Knee Flexion 5 5   Ankle Dorsiflexion 4 4     UE Strength: 01/23/2016    L UE (_/5) R UE (_/5)   Shoulder Abduction 4- (c/o shoulder pain) 4- (c/o shoulder pain)   Shoulder Flexion 4 4   Shoulder IR 5 5   Shoulder ER 4 4   Elbow Flexion 5 5   Elbow Extension 5 5     Range of Motion:  -Slight tightness in hamstring and hip flexors  -Otherwise WFL    -AROM in degrees: 01/30/2016   R shoulder flexion: 105   L shoulder flexion: 75   R shoulder abduction: 110   L shoulder abduction: 75    -PROM in degrees: 01/30/2016   R shoulder flexion: 120    L shoulder flexion: 130   R shoulder abduction: 115  L shoulder abduction: 75     -Ober's test positive on R    -Functional Mobility:    -Bed mobility: Independent with increased time due to back and shoulder pain    -Transfers: Independent     -Gait: good foot clearance, narrow BOS, decreased arm swing on the R compared to the L     -Balance:   Mini Balance Evaluation Systems Test (Mini BEST test)    1. Sit to stand: 2  2. Rise to toes: 1  3. Stand on one leg: 1  Left: 1  Right: 1  4. Compensatory stepping correction - forward: 1  5. Compensatory stepping correction - backward:0  6. Compensatory stepping correction - lateral: 0  Left: 0  Right: 1  7. Stance (feet together); eyes open, firm surface: 2  8. Stance (feet together); eyes closed, foam surface: 2  9. Incline - Eyes closed: 2  10.  Change in gait speed: 2  11.  Walk with head turns - horizontal: 1  12.  Walk with pivot turns: 2  13.  Step over obstacles: 2  14.  Timed up and go with dual task: 1  TUG: 08.59  TUG with dual task: 10.35    Initial Score:  19/28 (01/23/2016)  Most recent score:      Score interpretation:   Chronic Stroke - Francie Massing et al 2013): Scores of ? 17.5 identified those with a history of falling, sensitivity 64%,  specificity 64% AUC 0.64  Parkinson's Disease: - Scores of ? 19 identified recurrent fallers; sensitivity 79%, specificity 67% AUC 0.75 (King et al 2012). Scores of ? 21 differentiate those with and without postural response deficits with a sensitivity of 89%, specificity 81%    Modified CTSIB    1. Stand on firm surface with eyes open: 30/30 sec  2. Stand on firm surface with eyes closed: 30/30 sec  3. Stand on foam with eyes open: 30/30 sec  4. Stand on foam with eyes closed: 30/30 sec (significant increase in sway)    Initial score.  120  out of 120 sec (01/23/2016)  Most recent score:   out of 120 sec  ______________________________________________________________________________    02/10/2016    TREATMENT & EDUCATION:  Time in: 0900 Total Treatment time/minutes: 45    SUBJECTIVE: He reports that he is getting his procedure for the back pain next week.   Have you fallen since your last visit? No  Pain: low back and foot 4-6/10      1. Therapeutic exercise for 45 minutes     -Balance on a big anterior/posterior and lateral tilt board.  Worked on static balance and then reactive balance.  Pt grabbing onto the railing to catch his balance throughout.   -Static stance on a foam cushion.  Progressed to eyes closed.  -Narrow BOS with eyes closed with horizontal and vertical head turns.   -Star balance exercise. 3x on each leg  -Walking while tossing a basketball back and forth reaching in different directions. 100'x2.  Performed this again with a weighted ball. 100' x2.   -Walking while dribbling a basketball 100'x2  -Walking in a 1 foot path while tossing a weighted ball up and down to himself 100'.  Then performed this while tossing the ball from the L to the R hand. 100'  -Grapevine walking 100' with verbal cueing on sequencing his steps.   -Heel toe walking.  -Attempted SLS but pt c/o pain in his ankles  -Performed balance exercises  in the corner for safety: narrow BOS with eyes closed, Semi-tandem stance -initial  pain but improved with cues on form.     -Discussed following up with the MD with his shoulder.   -Discussed pt's back pain and gave him cues on posture to decrease his back pain    Home exercise instruction: verbal instructions and demonstrations were provided for the above exercises  a handout was provided describing the exercises in written and picture format  The patient was able to demonstrate the exercises after instructions.    -Hip flexor stretch  -Cervical side bend stretch    -Wall angles with shoulder retraction .   -Shoulder ER with red band.    -Shoulder Rows with a red band.   -Shoulder punches with a red band.     -Narrow BOS with eyes closed with head turns.   -Semi-tandem stance   -Heel toe walking  -Grapevine walking     ASSESSMENT OF TREATMENT PROVIDED TODAY: Pt continues to be limited due to his chronic back pain.  Able to work on higher level exercises with pt having increased difficulty when on uneven surfaces and with his reactive balance.  Added balance exercises to his home program that he can perform on his own.    DISCHARGE GOALS  to be met by:  04/22/2016  Patient Goals: Improve his balance     Functional Discharge      Goals Short Term Goals Impairments Current Level of Function /   Progress Toward Goal   1. Pt will improve his MiniBEST test to 23/28 to demonstrate improved balance.     2. Pt will be report his pain in his shoulder on average being a 2/10.     3. Pt will be able to sit with good posture without cueing. A. Pt will be independent in his HEP.     B. Pt will be able to perform dual task DGI with only a 10% time difference between each other. Weakness   Decreased sensation  Decreased balance 1. Worked on balance.     2. Worked on his shoulder today.     3. Needing cueing throughout session.     A. Initiated    B. Not addressed     These goals were discussed and the patient agrees with them.    PLAN OF CARE:  This patient will be seen for 1 therapy sessions per week over a 90  day time frame.  The patient will perform the instructed home program 2-3 times per day.  The therapy will include therapeutic exercise, manual therapy, ultrasound, therapeutic activities, neuromuscular reeducation and gait training    Intervention for next treatment:   -BP and aerobic exercise   -Core strengthening and stabilization exercises   -Follow up on stretching program  -Progress dynamic balance  -Address his IT band.  -Educate on safety with posture.    Morley Kos PT, DPT  [facility] Department   Physical Therapy   [phone] (Pager)     Referring Physician/Practitioner Certification Statement:   I certify the need for these services furnished under this plan of treatment and while under my care.      Signature:__________________________________Date:___________________

## 2016-02-11 ENCOUNTER — Telehealth (HOSPITAL_BASED_OUTPATIENT_CLINIC_OR_DEPARTMENT_OTHER): Payer: Self-pay | Admitting: Neurology

## 2016-02-11 NOTE — Telephone Encounter (Signed)
Left voice mail with pts wife to call us to sch an appointment with Dr. Mayford KnifeWilliams.

## 2016-02-11 NOTE — Telephone Encounter (Signed)
(  TEXTING IS AN OPTION FOR [facility] CLINICS ONLY)  Is this a [facility] clinic? No      RETURN CALL: Detailed message on voicemail only      SUBJECT:  Appointment Request     REASON FOR REQUEST/SYMPTOMS: hospital follow up  REFERRING PROVIDER: [facility]  REQUEST APPOINTMENT WITH: Dr. Fransisca ConnorsMichael Williams  REQUESTED DATE: first avail, TIME: first avail  UNABLE TO APPOINT BECAUSE: per sop, send TE for hospital follow up appointments. Please see [facility] info        When: 2 to 5 weeks   [facility]  [id number]  [place], [place] [id number]  [phone]   Comments: Follow up with Dr. Fransisca ConnorsMichael Williams Evaluate for NPH         Please assist and call patients wife back, thanks.

## 2016-02-12 ENCOUNTER — Ambulatory Visit: Payer: PPO | Admitting: Anesthesiology

## 2016-02-12 ENCOUNTER — Telehealth (INDEPENDENT_AMBULATORY_CARE_PROVIDER_SITE_OTHER): Payer: Self-pay | Admitting: Internal Medicine

## 2016-02-12 DIAGNOSIS — Z8679 Personal history of other diseases of the circulatory system: Secondary | ICD-10-CM

## 2016-02-12 DIAGNOSIS — S069X9D Unspecified intracranial injury with loss of consciousness of unspecified duration, subsequent encounter: Secondary | ICD-10-CM

## 2016-02-12 NOTE — Telephone Encounter (Signed)
Please sign and then I can fax back orders.

## 2016-02-12 NOTE — Telephone Encounter (Signed)
done

## 2016-02-13 DIAGNOSIS — E559 Vitamin D deficiency, unspecified: Secondary | ICD-10-CM

## 2016-02-13 HISTORY — DX: Vitamin D deficiency, unspecified: E55.9

## 2016-02-13 NOTE — Progress Notes (Signed)
Ronnie Mason is a 65 year old male     Review of patient's allergies indicates:  Allergies   Allergen Reactions   . Bee Venom Hives, Itching and Swelling   . Gabapentin      Flu like symptoms, diarrhea, body ache upset stomach   . Lidocaine Rash     Lidocaine patch. PATIENT STATES HE IS ALLERGIC TO THE ADHESIVE NOT THE PATCH.       BP 124/80   Pulse 89   Ht 6' 2.25" (1.886 m)   Wt (!) 221 lb (100.2 kg)   SpO2 99%   BMI 28.18 kg/m     Chief Complaint   Patient presents with   . Follow-Up      2 week follow up       HPI  History of:    pcp [name], MD   HM needed :  None     He is also followed by neurology Dr. 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 Valetta Mole -Patient should also be following up with Dr. 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 Elder Cyphers at [facility] January 22, 2016-Nerve block of the facet joint/medial branch blocks targeted to L3-L4, L4-L5, and L5-S1 facet joints (left) for chronic low back pain suspected to be facet origin-   Chronic pain        01/02/2016 - vitamin D was very low at 17 suspect contributing to achiness       Last visit w/ me 01/30/2016 amlodipine increase to 2.5 mg bid as concern for bp being up -referred to sleep clinic     Today follow-up above issues:     bp - 120/ 80 no dizziness - took once at home    cognitive fine most of time , unless in car w wife    Alcohol - none    Neck issue gone w Dr Amada Jupiter back , pending issue w right shoulder , cant do do  anything above 90 , right side 6 month ago and left side a few year ago-    Has been living with it    Pt for the shoulder recent- could not reach back 3-4 yrs but only complant for last few months, lidocaine patch both , seems to help    PT/OT/Speech - out patient - at [facility]    Foot R good - better if sandals  covered, seems diminish,    Hand R almost gone, now no longer using - brace worked the best ,    Need referral Engineer, petroleum for lip - post fall   Stem cell- want to discuss this for lower back -to ask Dr Amada Jupiter   Off hydrocodone - aleve 2 tylenol 2 ibufrofen  2 bid or 4 tylenol - this am 4 ibuprofen and 2 aleve- no gi problem yet    Vit d was low    This am forgot last night pills -       Office Visit on 01/02/16   1. CBC, DIFF   Result  Value Ref Range    WBC 6.83 4.3 - 10.0 10*3/uL    RBC 4.28 (L) 4.40 - 5.60 10*6/uL    Hemoglobin 14.2 13.0 - 18.0 g/dL    Hematocrit 43 38 - 50 %    MCV 101 (H) 81 - 98 fL    MCH 33.2 27.3 - 33.6 pg    MCHC 32.9 32.2 - 36.5 g/dL    Platelet Count 295 621 - 400 10*3/uL    RDW-CV 12.0 11.6 - 14.4 %    % Neutrophils 54 %    % Lymphocytes 31 %    % Monocytes 11 %    % Eosinophils 3 %    % Basophils 1 %    % Immature Granulocytes 0 %    Neutrophils 3.68 1.80 - 7.00 10*3/uL    Absolute Lymphocyte Count 2.10 1.00 - 4.80 10*3/uL    Monocytes 0.76 0.00 - 0.80 10*3/uL    Absolute Eosinophil Count 0.22 0.00 - 0.50 10*3/uL    Basophils 0.05 0.00 - 0.20 10*3/uL    Immature Granulocytes 0.02 0.00 - 0.05 10*3/uL    Nucleated RBC 0.00 0.00 10*3/uL    % Nucleated RBC 0 %   2. COMPREHENSIVE METABOLIC PANEL   Result Value Ref Range    Sodium 138 135 - 145 meq/L    Potassium 4.5 3.6 - 5.2 meq/L    Chloride 104 98 - 108 meq/L    Carbon Dioxide, Total 25 22 - 32 meq/L    Anion Gap 9 4 - 12    Glucose 99 62 - 125 mg/dL    Urea Nitrogen 22 (H) 8 - 21 mg/dL    Creatinine 3.08 6.57 - 1.18 mg/dL    Protein (Total) 7.0 6.0 - 8.2 g/dL    Albumin 4.3 3.5 - 5.2 g/dL    Bilirubin (Total) 0.3 0.2 - 1.3  mg/dL    Calcium 9.4 8.9 - 84.6 mg/dL    AST (GOT) 19 9 - 38 U/L    Alkaline Phosphatase (Total) 81 37 - 159 U/L    ALT (GPT) 21 10 - 48 U/L    GFR, Calc, European American >60 mL/min/[1.73_m2]    GFR, Calc, African American >60 mL/min/[1.73_m2]    GFR, Information       Calculated GFR in mL/min/1.73 m2 by MDRD equation.  Inaccurate with changing renal function.  See http://depts.[email or web address].it.html   3. VITAMIN D (25 HYDROXY)   Result Value Ref Range    Vitamin D2 (25_Hydroxy) 1.1 ng/mL    Vitamin D3 (25_Hydroxy) 16.4 ng/mL    Vit D (25_Hydroxy) Total 17.5 (L) 20.1 - 50.0 ng/mL    Vit D (25_Hydroxy) Interp Deficient:         8.0-20.0 ng/mL    4. SED RATE   Result Value Ref Range    Erythrocyte Sedimentation Rate 15 0 - 15 mm/h   5. CRP, HIGH SENSITIVITY   Result Value Ref Range    CRP, High Sensitivity 1.8 0.0 - 10.0 mg/L   6. CREATINE KINASE TOTAL ACTIVITY   Result Value Ref Range    Creatinine Kinase Total Activity 118 62 - 325 U/L   7. THYROID STIMULATING HORMONE   Result Value Ref Range    Thyroid Stimulating Hormone 1.127 0.400 - 5.000 u[IU]/mL   8. T4, FREE   Result Value Ref Range    Thyroxine (Free) 0.8 0.6 - 1.2 ng/dL         MEDICATION PRIOR TO VISIT  Reports  Outpatient Medications Prior to Visit   Medication Sig Dispense Refill   . Acetaminophen 500 MG Oral Tab 2 tablets bid     . AmLODIPine Besylate 2.5 MG Oral Tab 1 tablet bid dose increase 01/30/2016 180 tablet 3   . Atorvastatin Calcium 10 MG Oral Tab Take 1 tablet (10 mg) by mouth daily. 90 tablet 1   . Cholecalciferol (VITAMIN D3) 2000 units Oral Cap Take 1 capsule (2,000 Units) by mouth daily. For low level 1 capsule 1   . Clobetasol Propionate 0.05 % External Ointment Small amount to rash as needed bid for 1-2 weeks at a time 15 g 1   . Cyanocobalamin 1000 MCG Oral Tab one per day over-the-counter started 5/21 /2012 this level borderline low 1 Tab 1   . Diclofenac Sodium (VOLTAREN) 1 % Transdermal Gel Apply 4 g topically 4  times a day. Apply to neck, trapezius muscle, and low back. No more than 4 g applied at a time. 1 Tube 5   . DULoxetine HCl 60 MG Oral CAPSULE ENTERIC COATED PARTICLES Take 1 capsule (60 mg) by mouth daily. 90 capsule 1   . EPINEPHrine 0.3 MG/0.3ML Injection Solution Auto-injector Inject as instructed per patient package insert, 0.3 mg intramuscularly or subcutaneously into the thigh, if needed to treat anaphylaxis 3 each 1   . Glucosamine-Chondroitin (GLUCOSAMINE CHONDR COMPLEX OR)      . Lidocaine 5 % External Patch Apply 1 patch onto the skin daily. Apply to painful area for up to 12 hours in a 24 hour period. 30 patch 10   . Lisinopril 20 MG Oral Tab Take 1 tablet (20 mg) by mouth every 12 hours. 180 tablet 1   . Melatonin 3 MG Oral Tab 2 at night as needed for sleep 1 tablet 1   . Primidone 50 MG Oral Tab 1 po bid 180 tablet 0   . THIAMINE HCL OR      . TURMERIC OR        No facility-administered medications prior to visit.          REVIEW OF SYSTEMS - are otherwise negative unless as noted in HPI above or as additional issues below    Constitutional:     Eyes:    Head ears, nose mouth throat:     Cardiovascular:   Respiratory:     Gastrointestinal:   Genitourinary:      Musculoskeletal:  Frozen shoulders bilaterally   Integumentary:     Neurological:   Psychiatric/Behavioral:       Endocrine:     Hematological:   Allergic/Immunologic:        PHYSICAL EXAM  BP 124/80   Pulse 89   Ht 6' 2.25" (1.886 m)   Wt (!) 221 lb (100.2 kg)   SpO2 99%   BMI 28.18 kg/m     Well Dressed    Constitution:  In general looks:   well , good coloring  No diaphoresis    Patient is Alert  & Attentive  Patient is conversive    Psychiatric Mental Status / Neurologic Mental Status:  Affect:  Pleasant   Calm  Mood:  Good  Awake: Alert and attentive        Eyes: no discharge, no gross puffiness, non- icteric      Cardiovascular:     No edema    Lymph nodes:  No gross cervical swelling  No gross axillary swelling      Skin:  No gross  lesion or rash    Neurologic:   Grossly intact  Memory:   Attention       Cranial Nerve    Eyes pupil and lid are intact   II, III, IV, VIII   Facial Movement VII: grossly intact, no facial droop  Lip pulls up on right    90 deg frozen on both shoulders , can not reach up     Hematologic/Lymphatic /Immunologic:  No gross bruising or rash or hives       IMPRESSION / PLAN / DISCUSSION     [place] was seen today for follow-up .    Diagnoses and all orders for this visit:    Essential hypertension    History of alcohol dependence (HCC)    Intraparenchymal hemorrhage of brain (HCC)    Hyperlipidemia, unspecified hyperlipidemia type  -     Atorvastatin Calcium 10 MG Oral Tab; Take 1 tablet (10 mg) by mouth daily.    Chronic bilateral low back pain without sciatica  -     DULoxetine HCl 60 MG Oral CAPSULE ENTERIC COATED PARTICLES; Take 1 capsule (60 mg) by mouth daily.    Chronic pain syndrome  -     DULoxetine HCl 60 MG Oral CAPSULE ENTERIC COATED PARTICLES; Take 1 capsule (60 mg) by mouth daily.  -     Meloxicam 7.5 MG Oral Tab; 1-2 po per day as needed    Cervicalgia  -     DULoxetine HCl 60 MG Oral CAPSULE ENTERIC COATED PARTICLES; Take 1 capsule (60 mg) by mouth daily.  -     Meloxicam 7.5 MG Oral Tab; 1-2 po per day as needed    Adhesive capsulitis of both shoulders  -     XR SHOULDER 2+ VW LEFT  -     XR SHOULDER 2+ VW RIGHT  -     Meloxicam 7.5 MG Oral Tab; 1-2 po per day as needed    Scar of lip  -     REFERRAL TO PLASTIC SURGERY        bp controlled  He tends to forget pills and should not double up next am  Try to take med at breakfast , dinner    Not to use the term nag as it is up setting his wife    Work w pt for now  To discuss w Dr Amada Jupiter on the plan for  Back  Refer to plastics for lip    Shoulder work w pt  Change meloxicam ,less risk but still risk of bleed  No more ibuprofen naproxen  Stagger the tylenol  Risk for bleed   Currently no gi issue  Discussed concern consider prilosec   Recheck lab next  visit    Progressing on pt ot and speech h/o hypertensive brain bleed     Continue lipid med     See me 6 wk    Pending eval for neurology           MEDICATION as of Discharge  Current Outpatient Prescriptions   Medication Sig Dispense Refill   . Acetaminophen 500 MG Oral Tab 2 tablets bid     . AmLODIPine Besylate 2.5 MG Oral Tab 1 tablet bid dose increase 01/30/2016 180 tablet 3   . Atorvastatin Calcium 10 MG Oral Tab Take 1 tablet (10 mg) by mouth daily. 90 tablet 1   . Cholecalciferol (VITAMIN D3) 2000 units Oral Cap Take 1 capsule (2,000 Units) by mouth daily. For low  level 1 capsule 1   . Clobetasol Propionate 0.05 % External Ointment Small amount to rash as needed bid for 1-2 weeks at a time 15 g 1   . Cyanocobalamin 1000 MCG Oral Tab one per day over-the-counter started 5/21 /2012 this level borderline low 1 Tab 1   . Diclofenac Sodium (VOLTAREN) 1 % Transdermal Gel Apply 4 g topically 4 times a day. Apply to neck, trapezius muscle, and low back. No more than 4 g applied at a time. 1 Tube 5   . DULoxetine HCl 60 MG Oral CAPSULE ENTERIC COATED PARTICLES Take 1 capsule (60 mg) by mouth daily. 90 capsule 1   . EPINEPHrine 0.3 MG/0.3ML Injection Solution Auto-injector Inject as instructed per patient package insert, 0.3 mg intramuscularly or subcutaneously into the thigh, if needed to treat anaphylaxis 3 each 1   . Glucosamine-Chondroitin (GLUCOSAMINE CHONDR COMPLEX OR)      . Lidocaine 5 % External Patch Apply 1 patch onto the skin daily. Apply to painful area for up to 12 hours in a 24 hour period. 30 patch 10   . Lisinopril 20 MG Oral Tab Take 1 tablet (20 mg) by mouth every 12 hours. 180 tablet 1   . Melatonin 3 MG Oral Tab 2 at night as needed for sleep 1 tablet 1   . Meloxicam 7.5 MG Oral Tab 1-2 po per day as needed 60 tablet 1   . Primidone 50 MG Oral Tab 1 po bid 180 tablet 0   . THIAMINE HCL OR      . TURMERIC OR        No current facility-administered medications for this visit.          Risk of taking  medication properly and follow up discussed especially to call if problem    Patient   Family Member -   express understanding of care plan/medications    I spent a total time of 40 minutes face-to-face with the patient, of which more than 50% was spent counseling and coordinating care as outlined in this note.    Return in about 6 weeks (around 03/30/2016).     Cc to following MD:                ++++++++++++++++++++++++++++++++++++++++++++++++++        Patient Active Problem List    Diagnosis Date Noted   . Vitamin D deficiency [E55.9] 02/13/2016   . Balance problem [R26.89] 01/23/2016   . Chronic pain of both shoulders [M25.512, G89.29, M25.511] 01/23/2016   . Pain of right hip joint [M25.551] 01/23/2016   . 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 Essential hypertension [I10] 12/16/2015   . Essential tremor [G25.0] 12/16/2015   . Hx of spontan intraparenchymal intracran bleed assoc with hypertension [Z86.79] 12/16/2015     [place] - 4/29- 12/10/2015  Non-traumatic intraparenchymal hemorrhage, hypertensive of the left thalamus   Traumatic hemorrhage   -- falcine subdural hemorrhage, small cortical subarachnoid     . Alcohol abuse [F10.10] 12/16/2015   . 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 History of alcohol dependence (HCC) [F10.21] 06/26/2015   . 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 Idiopathic peripheral neuropathy [G60.9] 03/15/2015   . 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 myelopathy or radiculopathy [U98.119]  05/28/2014   . 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 Tremor [R25.1] 04/20/2013   . Lumbosacral radiculopathy at S1 [M54.17] 03/22/2013     Left     . Fall [W19.XXXA] 02/23/2013     BP < 100  Occasioanal tri[p  and leg weakness     . Back pain, lumbosacral [M54.5, M54.89] 02/15/2013   . Lumbar radiculopathy, chronic [M54.16] 02/15/2013   . Neck pain [M54.2] 02/15/2013   . Chronic pain [G89.29] 12/21/2012     Now at [facility], DO, [place] Gladebecca [facility]    Neck pain -burn c3-7 on left side per patient  Some trigger injection    Lumbar pain-since 80's better with exercise     . 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 Numbness and tingling of leg [R20.2] 12/21/2012     Left calf -left foot long term suspect from back-suspect L 45     . Chronic back pain [M54.9, G89.29] 12/21/2012     Mri in mid scape 1/214  Multilevel degenerative disc disease of the lumbar spine. Circumferential disc bulge at all levels below and including L1-2. No significant central spinal canal stenosis or neuroforaminal narrowing.  2. Mild retrolisthesis of L5 on S1.           . B12 deficiency [E53.8] 12/21/2012     Borderline low 5 /21/2014     . Chronic renal insufficiency, stage III (moderate) [N18.3] 12/21/2012     5/ 2013     . Hyperlipidemia [E78.5] 09/26/2012   . Cervicalgia [M54.2] 03/11/2010     Radiofrequency ablation of c3 to c 7     . Syncope [R55]    . Hypotension [I95.9]    . 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 URI (upper respiratory infection) [J06.9]          Past Medical History:   Diagnosis Date   . Carotid Sinus Hypersensitivity    . Chronic kidney disease    . History of EKG 5/97   . HYPERTENSION     . HYPOTENSION     . Lipidemia    . Pain of right hip joint 01/23/2016   . 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 Syncope    . URI (upper respiratory infection)        Social History     Social History   . Marital status: Married     Spouse name: N/A   . Number of children: N/A   . Years of education: N/A     Occupational History   . Not on file.     Social History Main Topics   . Smoking status: Never Smoker   . Smokeless tobacco: Not on file   . Alcohol use Yes   . Drug use:  No   . Sexual activity: Not on file     Other Topics Concern   . Not on file     Social History Narrative    ** Merged History Encounter **              Family History   Problem Relation Age of Onset   . Colon Cancer Father    . Heart (other) Mother      MI   . Other Family Hx Other      myelodysplasia       Social History     Social History   . Marital status: Married  Spouse name: N/A   . Number of children: N/A   . Years of education: N/A     Social History Main Topics   . Smoking status: Never Smoker   . Smokeless tobacco: Not on file   . Alcohol use Yes   . Drug use: No   . Sexual activity: Not on file     Other Topics Concern   . Not on file     Social History Narrative    ** Merged History Encounter **            Health Maintenance   Topic Date Due   . Influenza Vaccine (1) 04/03/2016   . Diabetes Screen  01/02/2019   . Cholesterol Test  09/11/2020   . Colonoscopy  09/24/2020   . Tetanus Vaccine  12/22/2022   . Zoster Vaccine  Completed   . Hepatitis C Screen  Addressed   . HIV Screen  Addressed

## 2016-02-17 ENCOUNTER — Encounter (INDEPENDENT_AMBULATORY_CARE_PROVIDER_SITE_OTHER): Payer: Self-pay | Admitting: Internal Medicine

## 2016-02-17 ENCOUNTER — Telehealth (INDEPENDENT_AMBULATORY_CARE_PROVIDER_SITE_OTHER): Payer: Self-pay | Admitting: Internal Medicine

## 2016-02-17 ENCOUNTER — Ambulatory Visit (INDEPENDENT_AMBULATORY_CARE_PROVIDER_SITE_OTHER): Payer: PPO | Admitting: Internal Medicine

## 2016-02-17 VITALS — BP 124/80 | HR 89 | Ht 74.25 in | Wt 221.0 lb

## 2016-02-17 DIAGNOSIS — M545 Low back pain, unspecified: Secondary | ICD-10-CM

## 2016-02-17 DIAGNOSIS — I10 Essential (primary) hypertension: Secondary | ICD-10-CM

## 2016-02-17 DIAGNOSIS — M542 Cervicalgia: Secondary | ICD-10-CM

## 2016-02-17 DIAGNOSIS — G894 Chronic pain syndrome: Secondary | ICD-10-CM

## 2016-02-17 DIAGNOSIS — Z6828 Body mass index (BMI) 28.0-28.9, adult: Secondary | ICD-10-CM

## 2016-02-17 DIAGNOSIS — M7501 Adhesive capsulitis of right shoulder: Secondary | ICD-10-CM

## 2016-02-17 DIAGNOSIS — L905 Scar conditions and fibrosis of skin: Secondary | ICD-10-CM

## 2016-02-17 DIAGNOSIS — M7502 Adhesive capsulitis of left shoulder: Secondary | ICD-10-CM

## 2016-02-17 DIAGNOSIS — I619 Nontraumatic intracerebral hemorrhage, unspecified: Secondary | ICD-10-CM

## 2016-02-17 DIAGNOSIS — G8929 Other chronic pain: Secondary | ICD-10-CM

## 2016-02-17 DIAGNOSIS — K130 Diseases of lips: Secondary | ICD-10-CM

## 2016-02-17 DIAGNOSIS — E785 Hyperlipidemia, unspecified: Secondary | ICD-10-CM

## 2016-02-17 DIAGNOSIS — F1021 Alcohol dependence, in remission: Secondary | ICD-10-CM

## 2016-02-17 MED ORDER — ATORVASTATIN CALCIUM 10 MG OR TABS
10.0000 mg | ORAL_TABLET | Freq: Every day | ORAL | 1 refills | Status: DC
Start: 2016-02-17 — End: 2016-08-24

## 2016-02-17 MED ORDER — DULOXETINE HCL 60 MG OR CPEP
60.0000 mg | DELAYED_RELEASE_CAPSULE | Freq: Every day | ORAL | 1 refills | Status: DC
Start: 2016-02-17 — End: 2016-03-30

## 2016-02-17 MED ORDER — MELOXICAM 7.5 MG OR TABS
ORAL_TABLET | ORAL | 1 refills | Status: DC
Start: 2016-02-17 — End: 2016-03-30

## 2016-02-17 NOTE — Progress Notes (Signed)
Does patient have 3 or more complaints:  NO  If yes, discuss with patient the need to schedule a follow up appointment due to limited appointment time.  Follow up appointment scheduled:  NO  Care Everywhere records available/ reconciled:  NO  PHQ2 complete:  NO  PHQ9 complete:  NO  Refills pended:  NO  Orders pended:  NO  Medications to discontinue:  NO    Health Maintenance updated:  NO    Health Maintenance   Topic Date Due   . Influenza Vaccine (1) 04/03/2016   . Diabetes Screen  01/02/2019   . Cholesterol Test  09/11/2020   . Colonoscopy  09/24/2020   . Tetanus Vaccine  12/22/2022   . Zoster Vaccine  Completed   . Hepatitis C Screen  Addressed   . HIV Screen  Addressed

## 2016-02-17 NOTE — Patient Instructions (Addendum)
No more aleve or ibprofen   Change med to breakfast dinner

## 2016-02-17 NOTE — Telephone Encounter (Signed)
Received a call today from Brand[facility]arlene at [facility] asking if the plan is still for pt to begin home health services tomorrow. Asked Dr.Kraft; Dr.Kraft had just seen pt this morning and was under the impression that outpatient would be taking over. Called pt's wife and left VM asking her to call back and clarify what the plan is for pt's care.

## 2016-02-18 NOTE — Telephone Encounter (Signed)
Noted,   Let home health know

## 2016-02-18 NOTE — Telephone Encounter (Signed)
Pt's wife Ronnie Mason called back stating they are currently using [facility] outpatient PT, OT, and Speech therapy. She is not sure why [facility] called; pt. is not using them. You can call her back if you have any additional questions.

## 2016-02-19 ENCOUNTER — Ambulatory Visit (HOSPITAL_BASED_OUTPATIENT_CLINIC_OR_DEPARTMENT_OTHER): Payer: PPO | Attending: Anesthesiology | Admitting: Anesthesiology

## 2016-02-19 ENCOUNTER — Other Ambulatory Visit: Payer: Self-pay | Admitting: Internal Medicine

## 2016-02-19 DIAGNOSIS — M7502 Adhesive capsulitis of left shoulder: Secondary | ICD-10-CM

## 2016-02-19 DIAGNOSIS — M7501 Adhesive capsulitis of right shoulder: Secondary | ICD-10-CM

## 2016-02-19 DIAGNOSIS — M545 Low back pain: Secondary | ICD-10-CM | POA: Insufficient documentation

## 2016-02-19 DIAGNOSIS — M5416 Radiculopathy, lumbar region: Secondary | ICD-10-CM

## 2016-02-19 NOTE — Progress Notes (Signed)
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

## 2016-02-20 ENCOUNTER — Encounter (HOSPITAL_BASED_OUTPATIENT_CLINIC_OR_DEPARTMENT_OTHER): Payer: PPO | Admitting: Speech-Language Pathologist

## 2016-02-20 ENCOUNTER — Ambulatory Visit (HOSPITAL_BASED_OUTPATIENT_CLINIC_OR_DEPARTMENT_OTHER): Payer: PPO | Admitting: Rehabilitative and Restorative Service Providers"

## 2016-02-20 DIAGNOSIS — I619 Nontraumatic intracerebral hemorrhage, unspecified: Secondary | ICD-10-CM

## 2016-02-20 DIAGNOSIS — M545 Low back pain, unspecified: Secondary | ICD-10-CM

## 2016-02-20 DIAGNOSIS — R2689 Other abnormalities of gait and mobility: Secondary | ICD-10-CM

## 2016-02-20 DIAGNOSIS — G8929 Other chronic pain: Secondary | ICD-10-CM

## 2016-02-20 DIAGNOSIS — M25551 Pain in right hip: Secondary | ICD-10-CM

## 2016-02-20 NOTE — Progress Notes (Signed)
Physical Therapy Progress Note  This note serves as a CMS evaluation form that requires the referring provider to certify the need for therapy services furnished under this Plan of Treatment. The signing provider is certifying the plan of care.    The following patient identifiers were confirmed today: name and date of birth.    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   Treatment/Therapy diagnosis:   Encounter Diagnoses   Name Primary?   . Intraparenchymal hemorrhage of brain ([place]) Yes   . Back pain, lumbosacral    . Balance problem    . Chronic pain of both shoulders    . Pain of right hip joint      Precautions: None          PERTINENT MEDICAL/SURGICAL HISTORY:   Past Medical History:   Diagnosis Date   . Carotid Sinus Hypersensitivity    . Chronic kidney disease    . History of EKG 5/97   . HYPERTENSION     . HYPOTENSION     . Lipidemia    . Pain of right hip joint 01/23/2016   . 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 Syncope    . URI (upper respiratory infection)         Past Surgical History:   Procedure Laterality Date   . ANES; COLONOSCOPY  2002    repeat in 3 years   . ANES; COLONOSCOPY & POLYPECTOMY  11/18/2007    repeat in 3 years   . FEMUR/KNEE SURG UNLISTED     . HAND/FINGER SURGERY UNLISTED     . SPINE SURGERY PROCEDURE UNLISTED  2013    rfa to c3 to c 7      OBJECTIVE MEASURES  -Attention/Cognition/Speech: Difficulty with short term memory which has improved since going home.   -Posture: forward head, rounded shoulders, R shoulder elevated compared to L,falttening in his lumbar spine.   -Sensation: Intact to light touch per report except numbness in the lateral calf to the bottom of the feet  -Tone  assessment: (Measured via MAS): 0/4 in B hamstrings, quads, and calves    LE Strength: 01/23/2016    L LE (_/5) R LE (_/5)   Hip Flexion 4 4- (gives away due to hip pain)   Hip Ext 4 4   Hip Abduction 4 4   Knee Extension 5 5   Knee Flexion 5 5   Ankle Dorsiflexion 4 4     UE Strength: 01/23/2016    L UE (_/5) R UE (_/5)   Shoulder Abduction 4- (c/o shoulder pain) 4- (c/o shoulder pain)   Shoulder Flexion 4 4   Shoulder IR 5 5   Shoulder ER 4 4   Elbow Flexion 5 5   Elbow Extension 5 5     Range of Motion:  -Slight tightness in hamstring and hip flexors  -Otherwise WFL    -AROM in degrees: 01/30/2016   R shoulder flexion: 105   L shoulder flexion: 75   R shoulder abduction: 110   L shoulder abduction: 75    -PROM in degrees: 01/30/2016   R shoulder flexion: 120    L shoulder flexion:  130   R shoulder abduction: 115   L shoulder abduction: 75     -Ober's test positive on R    -Functional Mobility:    -Bed mobility: Independent with increased time due to back and shoulder pain    -Transfers: Independent     -Gait: good foot clearance, narrow BOS, decreased arm swing on the R compared to the L     -Balance:   Mini Balance Evaluation Systems Test (Mini BEST test)    1. Sit to stand: 2  2. Rise to toes: 1  3. Stand on one leg: 1  Left: 1  Right: 1  4. Compensatory stepping correction - forward: 1  5. Compensatory stepping correction - backward:0  6. Compensatory stepping correction - lateral: 0  Left: 0  Right: 1  7. Stance (feet together); eyes open, firm surface: 2  8. Stance (feet together); eyes closed, foam surface: 2  9. Incline - Eyes closed: 2  10.  Change in gait speed: 2  11.  Walk with head turns - horizontal: 1  12.  Walk with pivot turns: 2  13.  Step over obstacles: 2  14.  Timed up and go with dual task: 1  TUG: 08.59  TUG with dual task: 10.35    Initial Score:  19/28 (01/23/2016)  Most recent score:      Score interpretation:   Chronic Stroke - Francie Massing et al 2013): Scores of ? 17.5 identified those with a  history of falling, sensitivity 64%, specificity 64% AUC 0.64  Parkinson's Disease: - Scores of ? 19 identified recurrent fallers; sensitivity 79%, specificity 67% AUC 0.75 (King et al 2012). Scores of ? 21 differentiate those with and without postural response deficits with a sensitivity of 89%, specificity 81%    Modified CTSIB    1. Stand on firm surface with eyes open: 30/30 sec  2. Stand on firm surface with eyes closed: 30/30 sec  3. Stand on foam with eyes open: 30/30 sec  4. Stand on foam with eyes closed: 30/30 sec (significant increase in sway)    Initial score.  120  out of 120 sec (01/23/2016)  Most recent score:   out of 120 sec  ______________________________________________________________________________    02/20/2016    TREATMENT & EDUCATION:  Time in: 0815 Total Treatment time/minutes: 45    SUBJECTIVE:  He had his back injections yesterday and it feels a lot better. He also had imaging done on his shoulder yesterday as well.  He has not gotten the results back from his shoulder.     Have you fallen since your last visit? No  Pain: 3/10 in low back    1. Therapeutic exercise for 30 minutes   -Prayer stretch forward.  1 minute hold. Attempted lateral prayer stretch but pt having increased pain in his lumbar spine  -Cat camel stretching 10x with cues on using slow movements   -Supine knees to chest stretch.   -Lateral table slide with inferior GH glide.   -Attempted supine shoulder ER stretch but pt c/o pain.  -Inferior GH glide while hanging onto the seat of the chair and leaning away.    -Using a weight and leaning over the edge of the chair for traction to his [place].    -Chest stretch in the doorway with cues on keeping his arm lower to prevent pain.     -X-ray findings: 02/19/2016  RIGHT SIDE: Moderate glenohumeral joint osteoarthropathy. Mild elevation of   the humeral head.  Prominent spur the acromion nearly contacts the humeral   head. AC joint mild proliferation. Coracoid unremarkable. No fracture.    Probable osteopenia accounting for subtle lucencies in the proximal humerus.    LEFT SIDE: Mild lateral humeral joint osteoarthropathy.    Spur the acromion. AC joint mild proliferation. Coracoid unremarkable. No   fracture. No loose body.    -Briefly went over his X-ray findings and printed out the report for his family.    2. Manual Therapy for 15 minutes .  -Inferior/posterior GH glide to both sides.  Pt having difficulty due to hand placement at times.  Showed 10 degrees improvement in AROM for abduction on the R but not on the L.   -Cross tissue mobilization to B pecs but pt having increased tenderness.     Home exercise instruction: verbal instructions and demonstrations were provided for the above exercises  a handout was provided describing the exercises in written and picture format  The patient was able to demonstrate the exercises after instructions.    -Hip flexor stretch  -Cervical side bend stretch    -Wall angles with shoulder retraction .   -Shoulder ER with red band.    -Shoulder Rows with a red band.   -Shoulder punches with a red band.     -Narrow BOS with eyes closed with head turns.   -Semi-tandem stance   -Heel toe walking  -[place] walking     ASSESSMENT OF TREATMENT PROVIDED TODAY: Focused on getting exercises for the pt's shoulder to increase joint space and decrease pain.  Able to give him stretching exercises that he was able to perform without increase in pain with some cueing on form..  Added balance exercises to his home program that he can perform on his own.    DISCHARGE GOALS  to be met by:  04/22/2016  Patient Goals: Improve his balance     Functional Discharge      Goals Short Term Goals Impairments Current Level of Function /   Progress Toward Goal   1. Pt will improve his MiniBEST test to 23/28 to demonstrate improved balance.     2. Pt will be report his pain in his shoulder on average being a 2/10.     3. Pt will be able to sit with good posture without cueing. A. Pt will  be independent in his HEP.     B. Pt will be able to perform dual task DGI with only a 10% time difference between each other. Weakness   Decreased sensation  Decreased balance 1. Not addressed today    2. Worked on his shoulder today.     3. Needing cueing throughout session.     A. Initiated    B. Not addressed     These goals were discussed and the patient agrees with them.    PLAN OF CARE:  This patient will be seen for 1 therapy sessions per week over a 90 day time frame.  The patient will perform the instructed home program 2-3 times per day.  The therapy will include therapeutic exercise, manual therapy, ultrasound, therapeutic activities, neuromuscular reeducation and gait training    Intervention for next treatment:   -BP and aerobic exercise   -Core strengthening and stabilization exercises   -Follow up on stretching program  -Progress dynamic balance  -Address his IT band.  -Educate on safety with posture.    Morley Kos PT, DPT  [facility] Rehabilitation Department   Physical Therapy  [phone] Health and safety inspector)  [phone] (Pager)     Referring Physician/Practitioner Certification Statement:   I certify the need for these services furnished under this plan of treatment and while under my care.      Signature:__________________________________Date:___________________

## 2016-02-21 ENCOUNTER — Ambulatory Visit: Payer: PPO | Attending: Neurology | Admitting: Neurology

## 2016-02-21 ENCOUNTER — Encounter (HOSPITAL_BASED_OUTPATIENT_CLINIC_OR_DEPARTMENT_OTHER): Payer: Self-pay | Admitting: Neurology

## 2016-02-21 VITALS — BP 130/89 | HR 93 | Temp 97.9°F | Ht 74.25 in | Wt 220.0 lb

## 2016-02-21 DIAGNOSIS — F068 Other specified mental disorders due to known physiological condition: Secondary | ICD-10-CM | POA: Insufficient documentation

## 2016-02-21 DIAGNOSIS — G629 Polyneuropathy, unspecified: Secondary | ICD-10-CM | POA: Insufficient documentation

## 2016-02-21 DIAGNOSIS — G910 Communicating hydrocephalus: Secondary | ICD-10-CM | POA: Insufficient documentation

## 2016-02-21 DIAGNOSIS — Z6828 Body mass index (BMI) 28.0-28.9, adult: Secondary | ICD-10-CM

## 2016-02-21 DIAGNOSIS — S069X0S Unspecified intracranial injury without loss of consciousness, sequela: Secondary | ICD-10-CM | POA: Insufficient documentation

## 2016-02-21 NOTE — Progress Notes (Signed)
ATTENDING ADDENDUM  I was present for the entirety history and exam.  I reviewed the images.  I counseled him and his wife.  He has hydrocephalus; however, his current symptoms are not from it.  His cognitive impairment is the result of his recent small intracerebral hemorrhage and the traumatic brain injury.  He is recovering.    The gait impairment is from his neuropathy and chronic back pain.    We need to better understand the hydrocephalus.  I would like him to have an MRI of the brain with special sequences to assess the cerebral aqueduct.  When him to continue his physical therapy and rehabilitation measures.    His follow-up will be with me in about 4 or 6 weeks, on the same day as the MRI scan of the brain.      Casimiro NeedleMichael A. Mayford KnifeWilliams M.D.  Professor of Neurology and Neurological Surgery    Note: Parts of this document have beencreated with voice recognition software.Although I have proofread the note, transcription errors may still exist.

## 2016-02-21 NOTE — Progress Notes (Signed)
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 Danella Deis. He is accompanied by his wife.  The patient and his wife provide the history. Additionally, I have reviewed approximately 10 pages of prior medical records, as well as recent CT and CTA scans of the brain in anticipation of our visit today.    History of Present Illness:  He was admitted to [facility] on November 30 2015 following a fall.  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  As a result of the fall, he also had a mild-moderate traumatic brain injury.  He has no recollection of the first 3 or 4 days while in the hospital.  The head CT incidentally revealed enlarged ventricles.  Thus, he is referred for evaluation of hydrocephalus.    Gait: Walking has been difficult for "many years" due to long-standing back pain.  Both of his legs are numb from the knee down. These symptoms were present as early as the 1980's and have been worsening since that time.  Nonetheless, he walks over a mile with the dog regularly. Leg weakness and back pain are the main limiting factors.  He has had radiofrequency ablation to the low back with Dr. Louie Bun. He notes that within the last year he has to slow down when walking or he will stumble and fall, and he does not have the reflexes to keep himself from falling.  He and his wife report that he has fallen at least 6 times in the past year and that this was occurring prior to his stroke.  He does not use a cane or assistive device. He is known to have neuropathy and was evaluated by Dr. Cameron Proud at [facility]. 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  With therapy, his gait and balance are  improving in the last 12 weeks since he fell.    Thinking and Memory: He agrees with his wife that he has had short term memory loss since the fall. This has been gradually improving. He recalls no memory issues before the stroke . In contrast, his wife has noticed changes in his thinking for the last several years which include confusion, not putting lids on food items and putting things in the wrong location in the house but is not sure if this was in the setting of past drinking behavior. Since the stroke, she notes that he will not remember people he has met, medical visits he has had and comments that he has been more irritable with her. She says is he is not frequently alone, but that she feels he could manage independently for several days at this point if needed. She helps him load his medi set but reports that he takes his medications regularly with this assistance. 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    Urinary Control:  No issues with incontinence except for a brief period during his inpatient hospitalization. Prior and post hospitalization he reports no issues with incontinence.    Other: The patient denies headache or new visual symptoms.     Copies of the patient's complete medical history form  and other documentation that I have reviewed, annotated, or created for this visit have been scanned into the electronic medical record.     Review of Systems   Musculoskeletal: Positive for back pain.   Neurological: Positive for weakness and headaches.   Psychiatric/Behavioral: Positive for memory loss.   All other systems reviewed and are negative.    Past Medical History  He  has a past medical history of Carotid Sinus Hypersensitivity; HYPERTENSION ; HYPOTENSION ; Intraparenchymal hemorrhage of brain ([place]) (2017); Pain of right hip joint (01/23/2016);  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 myelopathy or radiculopathy (05/28/2014); Syncope; 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([place]); and URI (upper respiratory infection).    Past Surgical History  He  has a past surgical history that includes UNLISTED PROCEDURE HANDS/FINGERS; UNLISTED PROCEDURE FEMUR/KNEE; ANES; COLONOSCOPY (2002); ANES; COLONOSCOPY & POLYPECTOMY (11/18/2007); and UNLISTED PROCEDURE SPINE (2013).    Family History  He family history includes Colon Cancer in his father; Heart (other) in his mother; Other Family Hx in an other family member.    Social History  He  reports that he has never smoked. He does not have any smokeless tobacco history on file. He reports that he drinks alcohol. He reports that he does not use drugs.    Advance Care Planning  He is in the process of writing a living and indicates he would like wife to make medical decisions for him if he could not make them for himself.    Allergies  Bee venom; Gabapentin; and Lidocaine    Medications  Outpatient Prescriptions Marked as Taking for the 02/21/16 encounter (Office Visit) with Michae Kava, MD   Medication Sig Dispense Refill   . Acetaminophen 500 MG Oral Tab 2 tablets bid     . AmLODIPine Besylate 2.5 MG Oral Tab 1 tablet bid dose increase 01/30/2016 180 tablet 3   . Atorvastatin Calcium 10 MG Oral Tab Take 1 tablet (10 mg) by mouth daily. 90 tablet 1   . Cholecalciferol (VITAMIN D3) 2000 units Oral Cap Take 1 capsule (2,000 Units) by mouth daily. For low level 1 capsule 1   . Clobetasol Propionate 0.05 % External Ointment Small amount to rash as needed bid for 1-2 weeks at a time 15 g 1   . Cyanocobalamin 1000 MCG Oral Tab one per day over-the-counter started 5/21 /2012 this level borderline low 1 Tab 1   . Diclofenac Sodium (VOLTAREN) 1 % Transdermal Gel Apply 4 g topically 4 times a day. Apply to neck, trapezius muscle, and low back. No more than 4 g applied at a time. 1 Tube 5   . DULoxetine HCl 60 MG Oral CAPSULE ENTERIC COATED  PARTICLES Take 1 capsule (60 mg) by mouth daily. 90 capsule 1   . EPINEPHrine 0.3 MG/0.3ML Injection Solution Auto-injector Inject as instructed per patient package insert, 0.3 mg intramuscularly or subcutaneously into the thigh, if needed to treat anaphylaxis 3 each 1   . Glucosamine-Chondroitin (GLUCOSAMINE CHONDR COMPLEX OR)      . Lidocaine 5 % External Patch Apply 1 patch onto the skin daily. Apply to painful area for up to 12 hours in a 24 hour period. 30 patch 10   . Lisinopril 20 MG Oral Tab Take 1 tablet (20 mg) by mouth every 12 hours. 180 tablet 1   . Melatonin 3 MG Oral Tab 2 at night as needed for sleep 1 tablet 1   . Meloxicam 7.5 MG Oral Tab 1-2 po per day as needed 60  tablet 1   . Primidone 50 MG Oral Tab 1 po bid 180 tablet 0   . THIAMINE HCL OR      . TURMERIC OR          Major Findings  On examination today, this is a pleasant 65 year old-year-old male sitting up straight on the exam table.  He is mildly irritable and interjects frequently throughout the interview.      Vital signs:  BP 130/89   Pulse 93   Temp 97.9 F (36.6 C) (Temporal)   Ht 6' 2.25" (1.886 m)   Wt (!) 220 lb (99.8 kg)   SpO2 97%   BMI 28.05 kg/m     Head:  The head is normocephalic.  The head circumference is 61.5 cm, which is the 90-97th percentile for an adult male whose height is 76 inches or 193 centimeters.     Spine:  The spine is straight and not tender.  I can palpate the spinous processes.      Skin:  The skin has no rashes or visible lesions.    Cardiovascular: Warm and well perfused.    Mental Status:  Assessed for orientation, recent and remote memory, attention, concentration, language, and fund of knowledge. The MOCA form 7.1 score is 20/30.  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  He named all 3 animals.  He registered 5 of 5 words for immediate repetition and with delay recalled none, but  seemed to give up too easily.  With category cueing he had one correct answer and 4 blanks.  He got the remaining 4 words with multiple choice.  He had no difficult with for her backward digit span, serial 7 subtractions, or orientation.  He had mild difficulty with sentence repetition, phonemic fluency, and abstraction.  His effort was not full.  He was impulsive during the visit.  The MoCA form is scanned into the electronic medical record.    Cranial Nerve Examination:  The optic fundi are normal.  The pupils are equal and reactive.  The extraocular movements are full including vertical gaze without nystagmus.  The cover/uncover test is normal.  The muscles of facial expression are intact as are facial sensation, hearing, head turning, tongue, and palate movement.    Motor Exam: Normal bulk, and strength, without atrophy, fasciculations, or drift.  Movements about the shoulders were limited by pain.  There is no resting tremor.  There is no tremor of the outstretched arms.    Reflexes: The reflexes are 1+ and symmetric in the biceps, brachioradialis.  They are 1+ and symmetric at the knees and ankles. No clonus at the ankle.    Sensory Exam:   Is intact to light touch.    Cerebellar Exam:  Intact for finger-to-nose, heel-to-knee.  There is no dysarthria.    Gait:  Assessed using the Tinetti assessment tool that shows a balance score of 16/16 and a gait score of 12/12 for a total score of 28/28.  He demonstrated no sway on Romberg exam was able to walk without stepping on heel to toe tandem walking.  For the 10 meter timed gait, on trial 1 he required 14 steps in 7.24 seconds, for a velocity of 1.49ms. On trial 2 he required 12 steps in 6.1 seconds, for a velocity of 1.666m. The 180-degree turn required less than 3 steps.  Review of Imaging:  I personally reviewed the the patient's CT and CTA head imaging from April 2017.  On the axial view, the frontal horn span is 5.7cm and the Evans ratio is 0.4.  The  IIIrd ventricle span is 12.99m.  On the sagittal view, the floor of the IIIrd ventricle is flat.  Atrophy is not noted. A hemorrhagic infarction is present in the left thalamus in close proximity to the internal capsule. There are small amounts of punctate blood at the apex adjacent to the falx.     Records Reviewed: Meds, Diagnostic Tests, Radiology and Reports    Assessment:  Today's visit was high complexity due to the multiple symptoms and diagnostic considerations, the need to review old records and scans, perform a comprehensive neurologic examination, create a differential diagnosis, formulate a plan, and counsel the patient regarding my impressions and recommendations.      By imaging criteria, Mr. TSullengerhas hydrocephalus; however, none of his current symptoms are from hydrocephalus.      His gait impairment is multifactorial from peripheral neuropathy and chronic low back pain.  His current memory difficulty is the consequence of the recent stroke and his traumatic brain injury.  He may have some contribution from his chronic alcohol use.      I explained my thoughts to him and his wife.  They understood and agreed to the plan as discussed below.    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 (F06.8,  S06.9X0S) Cognitive deficit as late [place] of [place]  3. (G62.9) Neuropathy    Orders:  Orders Placed This Encounter   . MRI BRAIN W/O CONTRAST     Plan:     -The next appointment will be occur in approximately 4-6 weeks.  -The [facility]  -We will collect a MRI image of the brain with attention to CSF spaces and flow on the day of his follow-up and plan to review them with him at that time.    ALegrand Como MD    ATTENDING ADDENDUM  I saw and evaluated the patient and personally reviewed the images and specimen and agree with Dr.Zylee Marchiano's note, which I  edited. I was present and observing the resident for the entire history and examination.  I personally reviewed the imaging.  I personally counseled the patient and his wife at the end of the visit.    See my separate note.    MLegrand ComoA. WJimmye NormanM.D.  Professor. of Neurology and Neurological Surgery      Note: Parts of this document have beencreated with voice recognition software.Although I have proofread the note, transcription errors may still exist.

## 2016-02-21 NOTE — Patient Instructions (Addendum)
You were sent to us for the question of hydrocephalus, which was discovered incidentally on the CT scans at the time of your stroke and your fall.  Indeed, the spinal fluid cavities of the brain, known as the ventricles are large, which means that you do have hydrocephalus.  However, you do not have symptoms of hydrocephalus.    Currently your symptoms are from several other issues.  The first is that you are still recovering from the small brain hemorrhage and traumatic brain injury.  The second is that you have the chronic low back pain.  The third is that you have the neuropathy.  Together these are responsible for your cognitive difficulties and your gait difficulty.  In fact, on examination today, your exam is essentially normal.  You had only minor difficulty on the cognitive screening test and no difficulty with your gait.    To better understand the hydrocephalus, I would like to have an MRI of the brain.  We will arrange it so that you can have that scan and see me in follow-up on the same day. [facility]    I want you to continue to work with your rehabilitation team for your recovery.  Please continue see Dr. Miguel DibbleKirschner for your neuropathy.

## 2016-02-26 ENCOUNTER — Ambulatory Visit (HOSPITAL_BASED_OUTPATIENT_CLINIC_OR_DEPARTMENT_OTHER): Payer: PPO | Admitting: Rehabilitative and Restorative Service Providers"

## 2016-02-26 ENCOUNTER — Ambulatory Visit (HOSPITAL_BASED_OUTPATIENT_CLINIC_OR_DEPARTMENT_OTHER): Payer: PPO | Admitting: Speech-Language Pathologist

## 2016-02-26 ENCOUNTER — Encounter (HOSPITAL_BASED_OUTPATIENT_CLINIC_OR_DEPARTMENT_OTHER): Payer: PPO | Admitting: Rehabilitative and Restorative Service Providers"

## 2016-02-26 DIAGNOSIS — I619 Nontraumatic intracerebral hemorrhage, unspecified: Secondary | ICD-10-CM

## 2016-02-26 DIAGNOSIS — M545 Low back pain, unspecified: Secondary | ICD-10-CM

## 2016-02-26 DIAGNOSIS — Z8679 Personal history of other diseases of the circulatory system: Secondary | ICD-10-CM

## 2016-02-26 DIAGNOSIS — G8929 Other chronic pain: Secondary | ICD-10-CM

## 2016-02-26 DIAGNOSIS — R2689 Other abnormalities of gait and mobility: Secondary | ICD-10-CM

## 2016-02-26 DIAGNOSIS — R41841 Cognitive communication deficit: Secondary | ICD-10-CM

## 2016-02-26 DIAGNOSIS — M25511 Pain in right shoulder: Secondary | ICD-10-CM

## 2016-02-26 DIAGNOSIS — M25551 Pain in right hip: Secondary | ICD-10-CM

## 2016-02-26 NOTE — Progress Notes (Signed)
Physical Therapy Progress Note  This note serves as a CMS evaluation form that requires the referring provider to certify the need for therapy services furnished under this Plan of Treatment. The signing provider is certifying the plan of care.    The following patient identifiers were confirmed today: name and date of birth.    Certification From: 42/68/34  Certification To: 19/62/22    Date of Symptom Onset : 11/30/15  Start of Care Date: 01/23/16    Visits from Start of Episode: 5    Referring Provider: Royce Macadamia*    NPI: [id number]  Reason for Referral: 65 year old male with hypertensive L thalamic ICH.   Treatment/Therapy diagnosis:   Encounter Diagnoses   Name Primary?   . Chronic pain of both shoulders Yes   . Pain of right hip joint    . Back pain, lumbosacral    . Intraparenchymal hemorrhage of brain ([place])    . Balance problem      Precautions: None          PERTINENT MEDICAL/SURGICAL HISTORY:   Past Medical History:   Diagnosis Date   . Carotid Sinus Hypersensitivity    . HYPERTENSION     . HYPOTENSION     . Intraparenchymal hemorrhage of brain ([place]) 2017   . Pain of right hip joint 01/23/2016   . 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 Syncope    . 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([place])    . URI (upper respiratory infection)       X-ray findings: 02/19/2016  RIGHT SIDE: Moderate glenohumeral joint osteoarthropathy. Mild elevation of   the humeral head. Prominent spur the acromion nearly contacts the humeral   head. AC joint mild proliferation. Coracoid unremarkable. No fracture.   Probable osteopenia accounting for subtle lucencies in the proximal humerus.    LEFT SIDE: Mild lateral humeral joint osteoarthropathy.    Spur the acromion. AC joint mild proliferation. Coracoid unremarkable. No   fracture. No loose body.    Past Surgical History:   Procedure Laterality Date   . ANES; COLONOSCOPY  2002    repeat in 3 years   . ANES; COLONOSCOPY & POLYPECTOMY   11/18/2007    repeat in 3 years   . FEMUR/KNEE SURG UNLISTED     . HAND/FINGER SURGERY UNLISTED     . SPINE SURGERY PROCEDURE UNLISTED  2013    rfa to c3 to c 7      OBJECTIVE MEASURES  -Attention/Cognition/Speech: Difficulty with short term memory which has improved since going home.   -Posture: forward head, rounded shoulders, R shoulder elevated compared to L,falttening in his lumbar spine.   -Sensation: Intact to light touch per report except numbness in the lateral calf to the bottom of the feet  -Tone assessment: (Measured via MAS): 0/4 in B hamstrings, quads, and calves    LE Strength: 01/23/2016    L LE (_/5) R LE (_/5)   Hip Flexion 4 4- (gives away due to hip pain)   Hip Ext 4 4   Hip Abduction 4 4   Knee Extension 5 5   Knee Flexion 5 5   Ankle Dorsiflexion 4 4     UE Strength: 01/23/2016    L UE (_/5) R UE (_/5)   Shoulder Abduction 4- (c/o shoulder pain) 4- (c/o shoulder pain)   Shoulder Flexion 4 4   Shoulder IR 5 5   Shoulder ER 4 4   Elbow  Flexion 5 5   Elbow Extension 5 5     Range of Motion:  -Slight tightness in hamstring and hip flexors  -Otherwise WFL    -AROM in degrees: 01/30/2016   R shoulder flexion: 105   L shoulder flexion: 75   R shoulder abduction: 110   L shoulder abduction: 75    -PROM in degrees: 01/30/2016   R shoulder flexion: 120    L shoulder flexion: 130   R shoulder abduction: 115   L shoulder abduction: 75     -Ober's test positive on R    -Functional Mobility:    -Bed mobility: Independent with increased time due to back and shoulder pain    -Transfers: Independent     -Gait: good foot clearance, narrow BOS, decreased arm swing on the R compared to the L     -Balance:   Mini Balance Evaluation Systems Test (Mini BEST test)    1. Sit to stand: 2  2. Rise to toes: 1  3. Stand on one leg: 1  Left: 1  Right: 1  4. Compensatory stepping correction - forward: 1  5. Compensatory stepping correction - backward:0  6. Compensatory stepping correction - lateral: 0  Left: 0  Right:  1  7. Stance (feet together); eyes open, firm surface: 2  8. Stance (feet together); eyes closed, foam surface: 2  9. Incline - Eyes closed: 2  10.  Change in gait speed: 2  11.  Walk with head turns - horizontal: 1  12.  Walk with pivot turns: 2  13.  Step over obstacles: 2  14.  Timed up and go with dual task: 1  TUG: 08.59  TUG with dual task: 10.35    Initial Score:  19/28 (01/23/2016)  Most recent score:      Score interpretation:   Chronic Stroke - Francie Massing et al 2013): Scores of ? 17.5 identified those with a history of falling, sensitivity 64%, specificity 64% AUC 0.64  Parkinson's Disease: - Scores of ? 19 identified recurrent fallers; sensitivity 79%, specificity 67% AUC 0.75 (King et al 2012). Scores of ? 21 differentiate those with and without postural response deficits with a sensitivity of 89%, specificity 81%    Modified CTSIB    1. Stand on firm surface with eyes open: 30/30 sec  2. Stand on firm surface with eyes closed: 30/30 sec  3. Stand on foam with eyes open: 30/30 sec  4. Stand on foam with eyes closed: 30/30 sec (significant increase in sway)    Initial score.  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   Have you fallen since your last visit? No  Pain: 3/10 in low back    1. Manual Therapy for 10 minutes  -Manual stretching for upper trap and levator.   -STM to upper trap  -Trigger point release along upper trap and levator    2. Neuromuscular Re-education for 35 minutes   -Star balance exercise working on SLS and reaching in a clockwise direction.  3x on each leg  -Static balance on a BOSU on both sides.  10x mini squats on upside down BOSU.  Progressed to perturbations   -Static balance  on foam cushion with eyes closed.    -Static balance on ground with eyes closed with perturbations.   -Tandem stance on ground.  Switched which foot was leading.    -SLS.   -Seated rest break in between exercises due to fatigue in his legs.     -Continued to reinforce good posture to decrease strain on his shoulders.     Home exercise instruction: verbal instructions and demonstrations were provided for the above exercises  a handout was provided describing the exercises in written and picture format  The patient was able to demonstrate the exercises after instructions.    -Hip flexor stretch  -Cervical side bend stretch    -Wall angles with shoulder retraction .   -Shoulder ER with red band.    -Shoulder Rows with a red band.   -Shoulder punches with a red band.     -Narrow BOS with eyes closed with head turns.   -Semi-tandem stance   -Heel toe walking  -Grapevine walking     ASSESSMENT OF TREATMENT PROVIDED TODAY: Continued to work on reactive and static balance.  Pt getting easily fatigued in his legs needing rest breaks in between exercises.  Continued to need reinforcement on posture in order to help with his shoulders and back pain.     DISCHARGE GOALS  to be met by:  04/22/2016  Patient Goals: Improve his balance     Functional Discharge      Goals Short Term Goals Impairments Current Level of Function /   Progress Toward Goal   1. Pt will improve his MiniBEST test to 23/28 to demonstrate improved balance.     2. Pt will be report his pain in his shoulder on average being a 2/10.     3. Pt will be able to sit with good posture without cueing. A. Pt will be independent in his HEP.     B. Pt will be able to perform dual task DGI with only a 10% time difference between each other. Weakness   Decreased sensation  Decreased balance 1.Worked on balance    2. Worked on his shoulder today.     3. Needing cueing throughout session.     A. Initiated    B. Not addressed     These goals were discussed and the patient  agrees with them.    PLAN OF CARE:  This patient will be seen for 1 therapy sessions per week over a 90 day time frame.  The patient will perform the instructed home program 2-3 times per day.  The therapy will include therapeutic exercise, manual therapy, ultrasound, therapeutic activities, neuromuscular reeducation and gait training    Intervention for next treatment:   -BP and aerobic exercise   -Core strengthening and stabilization exercises   -Follow up on stretching program  -Progress dynamic balance  -Address his IT band.  -Educate on safety with posture.    Morley Kos PT, DPT  [facility] Rehabilitation Department   Physical Therapy   [phone] (Pager)     Referring Physician/Practitioner Certification Statement:   I certify the need for these services furnished under this plan of treatment and while under my care.      Signature:__________________________________Date:___________________

## 2016-02-26 NOTE — Patient Instructions (Signed)
Balance Exercises:    *Perform this in the corner for safety.    -Feet together.  Eyes closed.  Horizontal or vertical head turns.     -Staggered stance.  Toes pointed in the same direction.  If you feel like you can do this safely for 30 seconds with your eyes open.  Try doing it with your eyes closed.    Walking Balance Exercises :     -Grapevine walking: Over-out-behind-out.  You should be working on your balance when you cross your feet.  Do this along a counter where you can hold onto something.     -Heel toe walking along a line.

## 2016-02-26 NOTE — Progress Notes (Signed)
Speech Therapy Progress Note    The following patient identifiers were confirmed today: name and date of birth.    General Assessment  Date of Symptom Onset : 11/30/15  Start of Care Date: 01/23/16  Reason for Referral: cognitive communication impairment  Pain Score: 0 - No pain  Have you fallen in the past year?: Yes  Fall Screen Date: 01/23/16  Interpreter Status: Not Needed      Visits from start of care: 5    Referring Provider: Mikey Bussing*    Treatment/Therapy diagnosis:   Encounter Diagnoses   Name Primary?   [name] Hx of spontan intraparenchymal intracran bleed assoc with hypertension Yes   . Cognitive communication deficit          SUBJECTIVE:   Patient Statement: Ronnie Mason was on time to the appointment and participated well throughout the session.  He reports that he has been using the bus on a regular basis and has found it to be fairly easy.  He had an appointment with neurology which he felt was productive and has a follow up with Dr. Ferdinand Lango later this week.  He had questions about rehab psychology and anxiety which he was encouraged to discuss with Dr. Ferdinand Lango.                     TREATMENT & EDUCATION:   Time in: 10:00 am     Duration: 55 minutes     Interventions/Treatment provided:    Cognitive skills Tx    Memory:  Clide has been using his planner on a regular basis over the past 2 weeks.  He is writing both future events as well as recording information and tasks that have been completed.         Attention/planning/organizing:  Completed subtest 3 (making a decision) from the [facility].  He required moderate cues to slow down and accurately assess information that he was reading.  Without cues, he scored 3/5 for accuracy (4th percentile) and 4/5 for rationale (33rd percentile).  However, when given prompts to redirect to specific sentences in the written information, he was able to make more accurate deductions.            ASSESSMENT OF TREATMENT: Dwane verbalized good understanding of strategies that  were taught and practiced today for increased note taking as well as improved attention in complex reasoning.  He benefits from use of strategies to focus on one item at a time and minimize external distractions.  He is aware of difficulties with attention and memory and is anxious to improve note-taking as well as other cognitive skills.       DISCHARGE GOALS:    Goal #1: Pt will develop and utilize strategies for increased recall of daily information with minimal to no cues.  Time frame:  April 02, 2016  Progress toward goal:  Continued to develop and train in strategies for improved recall and follow through.    Goal #2: Pt will develop and utilize strategies for improved attention and problem solving with minimal cues in moderately complex reasoning tasks.  Time frame:  April 02, 2016  Progress toward goal:  Continued to develop and train in strategies for improved attention and problem solving.        Rehabilitation potential is: Good  Potential Barriers to achieve rehab goals: memory  These goals were discussed and the patient agrees with them.    Plan of care: Yahel will work on using his daily planner and  to do lists for making a plan each day and then following that plan.  He will also continue to practice listening to 2-3 TED talks over the week and practice note taking. He will return the therapy next week to continue POT.        Zenaida Deed Ariyonna Twichell, MS, CCC-SLP, BC-ANCDS

## 2016-02-27 ENCOUNTER — Encounter (INDEPENDENT_AMBULATORY_CARE_PROVIDER_SITE_OTHER): Payer: Self-pay | Admitting: Cardiovascular Disease

## 2016-02-27 ENCOUNTER — Telehealth (HOSPITAL_BASED_OUTPATIENT_CLINIC_OR_DEPARTMENT_OTHER): Payer: Self-pay | Admitting: Neurology

## 2016-02-27 ENCOUNTER — Ambulatory Visit (INDEPENDENT_AMBULATORY_CARE_PROVIDER_SITE_OTHER): Payer: PPO | Admitting: Cardiovascular Disease

## 2016-02-27 ENCOUNTER — Ambulatory Visit (HOSPITAL_BASED_OUTPATIENT_CLINIC_OR_DEPARTMENT_OTHER): Payer: PPO | Attending: Physical Medicine & Rehabilitation | Admitting: Physical Medicine & Rehabilitation

## 2016-02-27 VITALS — BP 145/99 | HR 97 | Temp 97.0°F | Ht 74.35 in | Wt 225.5 lb

## 2016-02-27 VITALS — BP 150/110 | HR 82 | Ht 74.25 in | Wt 225.7 lb

## 2016-02-27 DIAGNOSIS — S069X9D Unspecified intracranial injury with loss of consciousness of unspecified duration, subsequent encounter: Secondary | ICD-10-CM | POA: Insufficient documentation

## 2016-02-27 DIAGNOSIS — Z6828 Body mass index (BMI) 28.0-28.9, adult: Secondary | ICD-10-CM

## 2016-02-27 DIAGNOSIS — F1010 Alcohol abuse, uncomplicated: Secondary | ICD-10-CM

## 2016-02-27 DIAGNOSIS — I619 Nontraumatic intracerebral hemorrhage, unspecified: Secondary | ICD-10-CM

## 2016-02-27 DIAGNOSIS — I10 Essential (primary) hypertension: Secondary | ICD-10-CM

## 2016-02-27 NOTE — Telephone Encounter (Signed)
(  TEXTING IS AN OPTION FOR [facility] CLINICS ONLY)  Is this a [facility] clinic? No      RETURN CALL: Detailed message on voicemail only      SUBJECT:  Appointment Request     REASON FOR REQUEST/SYMPTOMS: Patient needs a 4 week follow up.   REFERRING PROVIDER: was a patient at [facility] with [place] APPOINTMENT WITH: Mayford Knife  REQUESTED DATE: week of 03/30/16, TIME: depends on day  UNABLE TO APPOINT BECAUSE: no recall tab, return to provider, new to clinic

## 2016-02-27 NOTE — Progress Notes (Signed)
Cardiovascular Clinical Evaluation Note    Primary Care Provider: [name], MD    Referring Provider: No ref. provider found    DOB:  [birth year]    CHIEF COMPLAINT:  Intracranial bleed following fall    HISTORY OF PRESENT ILLNESS:  [name] for an intracranial bleed and was hospitalized at [facility].  As his wife recounts, he was drinking bourbon in his coffee at an early hours of the morning and went walking his dog.  He has no recollection of the events.  The suspicion is that he tripped and suffered head injury on falling.  His blood alcohol level was over 300 at the time of evaluation in the emergency room.  He apparently has had previous episodes of falls related to alcohol.  He has known intermittent hypotension as well as hypertension.  Blood pressures at home are typically 130/80.  He has not taken it however in recent weeks.      PROBLEM LIST:  Patient Active Problem List    Diagnosis Date Noted   . Cervicalgia [M54.2] 03/11/2010     Radiofrequency ablation of c3 to c 7     . Syncope [R55]    . Hypotension [I95.9]    . 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 URI (upper respiratory infection) [J06.9]    . Vitamin D deficiency [E55.9] 02/13/2016   . Balance problem [R26.89] 01/23/2016   . Chronic pain of both shoulders [M25.512, G89.29, M25.511] 01/23/2016     X ray arthritis  02/19/2016       . Pain of right hip joint [M25.551] 01/23/2016   . 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 Essential hypertension [I10] 12/16/2015   . Essential tremor [G25.0] 12/16/2015   . Hx of spontan intraparenchymal intracran bleed assoc with hypertension [Z86.79] 12/16/2015     [place] - 4/29- 12/10/2015  Non-traumatic intraparenchymal hemorrhage, hypertensive of the left thalamus   Traumatic hemorrhage      -- falcine subdural hemorrhage, small cortical subarachnoid     . Alcohol abuse [F10.10] 12/16/2015   . 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 History of alcohol dependence (HCC) [F10.21] 06/26/2015   . 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 Idiopathic peripheral neuropathy [G60.9] 03/15/2015   . 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 Tremor [R25.1] 04/20/2013   . Lumbosacral radiculopathy at S1 [M54.17] 03/22/2013     Left     . Fall [W19.XXXA] 02/23/2013     BP < 100  Occasioanal tri[p and leg weakness     . Back pain, lumbosacral [M54.5, M54.89] 02/15/2013   . Lumbar radiculopathy, chronic [M54.16] 02/15/2013   . Neck pain [M54.2] 02/15/2013   . Chronic pain [G89.29] 12/21/2012     Now at [facility], DO, Santiago Glad    Neck pain -burn c3-7 on left side per patient  Some trigger injection    Lumbar pain-since 80's better with exercise     . 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 Numbness and tingling of leg [R20.2] 12/21/2012     Left calf -left foot long term suspect from back-suspect L 45     . Chronic  back pain [M54.9, G89.29] 12/21/2012     Mri in mid scape 1/214  Multilevel degenerative disc disease of the lumbar spine. Circumferential disc bulge at all levels below and including L1-2. No significant central spinal canal stenosis or neuroforaminal narrowing.  2. Mild retrolisthesis of L5 on S1.           . B12 deficiency [E53.8] 12/21/2012     Borderline low 5 /21/2014     . Chronic renal insufficiency, stage III (moderate) [N18.3] 12/21/2012     5/ 2013     . Hyperlipidemia [E78.5] 09/26/2012       MEDICATIONS:    Current Outpatient Prescriptions   Medication Sig Dispense Refill   . Acetaminophen 500 MG Oral Tab 2 tablets bid     . AmLODIPine Besylate 2.5 MG Oral Tab 1 tablet bid dose increase 01/30/2016 180  tablet 3   . Atorvastatin Calcium 10 MG Oral Tab Take 1 tablet (10 mg) by mouth daily. 90 tablet 1   . Cholecalciferol (VITAMIN D3) 2000 units Oral Cap Take 1 capsule (2,000 Units) by mouth daily. For low level 1 capsule 1   . Clobetasol Propionate 0.05 % External Ointment Small amount to rash as needed bid for 1-2 weeks at a time 15 g 1   . Cyanocobalamin 1000 MCG Oral Tab one per day over-the-counter started 5/21 /2012 this level borderline low 1 Tab 1   . Diclofenac Sodium (VOLTAREN) 1 % Transdermal Gel Apply 4 g topically 4 times a day. Apply to neck, trapezius muscle, and low back. No more than 4 g applied at a time. 1 Tube 5   . DULoxetine HCl 60 MG Oral CAPSULE ENTERIC COATED PARTICLES Take 1 capsule (60 mg) by mouth daily. 90 capsule 1   . EPINEPHrine 0.3 MG/0.3ML Injection Solution Auto-injector Inject as instructed per patient package insert, 0.3 mg intramuscularly or subcutaneously into the thigh, if needed to treat anaphylaxis 3 each 1   . Glucosamine-Chondroitin (GLUCOSAMINE CHONDR COMPLEX OR)      . Lidocaine 5 % External Patch Apply 1 patch onto the skin daily. Apply to painful area for up to 12 hours in a 24 hour period. 30 patch 10   . Lisinopril 20 MG Oral Tab Take 1 tablet (20 mg) by mouth every 12 hours. 180 tablet 1   . Melatonin 3 MG Oral Tab 2 at night as needed for sleep 1 tablet 1   . Meloxicam 7.5 MG Oral Tab 1-2 po per day as needed 60 tablet 1   . Primidone 50 MG Oral Tab 1 po bid 180 tablet 0   . THIAMINE HCL OR      . TURMERIC OR        No current facility-administered medications for this visit.        ALLERGIES:  Bee venom; Gabapentin; and Lidocaine      PSFH: He has chronic Neck and back pain.  The lower back pain was recently addressed with nerve ablation with symptomatic improvement.  He has also been identified as having hydrocephalus.    REVIEW OF SYSTEMS:  See Cardiovascular HPI for pertinent positives and negatives.   RESPIRATORY:  No sputum, hemoptysis.    PHYSICAL EXAM     CONSTITUTIONAL:  This is a well-nourished male in no distress.   BP (!) 150/110   Pulse 82   Ht 6' 2.25" (1.886 m) Comment: pt provided  Wt (!) 225 lb 11.2 oz (102.4 kg)   BMI 28.78 kg/m  NECK:  Jugular venous pressure normal.  Thyroid is not enlarged.  RESPIRATORY:   Clear to percussion and auscultation.  CARDIOVASCULAR:  Regular rhythm.  S1 and S2 normal. No S3, S4, no murmur.   EXTREMITIES:  No clubbing, cyanosis, edema or tenderness noted.      IMPRESSION :   1.  Intracranial hemorrhage: This appears to of been traumatic by history.  There is a bee especially likely given his elevated alcohol level and his chronic balance issues of the lower extremities from his neurologic disease.  2.  Hypertension: This is always labile, impacted by stress and pain and subject to periods of symptomatic low pressure  3.  Reported hydrocephalus    PLAN:    He is going to need to monitor pressures at home.  They're typically elevated when he is in the office but tends to treat that have precipitated symptomatic hypotension in the past.  We cannot allow him to fall again from an iatrogenic standpoint.  Alcohol was discussed with him and he is aware that that needs to stop.  He clearly had no appreciation for his consumption as he tries to deny the validity of the blood alcohol test in the emergency room.      Electronically signed by Eilleen Kempf, MD    02/27/2016  1:38 PM    CC:   Ronnie Medal, MD  No ref. provider found

## 2016-02-27 NOTE — Telephone Encounter (Signed)
I spoke to [place] and scheduled an MRI brain without contrast and office visit for August 31, per patient's spouse's request.  I mailed the reminder letters,map, and established patient questionnaire.  I requested that the forms be filled out at a date closer to the appointment.

## 2016-02-27 NOTE — Patient Instructions (Signed)
Thank you for coming to [facility] today.   Our goal is to provide easy to understand instructions.      Reminders from your visit today:  - Please continue with therapy.  I will check with them to see when would be the best time to refer you to rehab psychology.      - Please continue with exercise at home and please continue to avoid alcohol.     - Please do not drive at this time.  Let's keep discussing with Dr. Marisa Sprinkles about how to proceed.  I will send my note to her as well.     - No change in your medication except that you can stop melatonin.     Have other questions? - Just let us know, we'd be happy to help. Please feel free to call us at: [phone] option [phone] to speak with rehab nursing staff during business hours.      If a rehab clinic nurse is not available to answer, your phone will be forwarded to the call center [phone]).  Please ask to speak with at rehabilitation clinic nurse if you do get forwarded to them.    You can also use the "email" feature in eCare/myChart.

## 2016-02-27 NOTE — Progress Notes (Signed)
Physiatry (PM&R)  [facility]  Portions of this chart may have been created with speech recognition software. Occasional wrong word or "sound-alike" substitutions may have occurred    Visit Information:  His wife presents with to the clinic and provides additional history  Additional information obtained from review of electronic medical records   Last clinic visit: 01/03/16  Language: [place];    CC: cognitive impairment and agitation after thalamic IPH     ID: 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     HPI:  He began outpatient therapy at [facility] since his last clinic visit.  Usually wakes up from joint and muscle pain.  He underwent treatment for his back pain with improvement for his endurance and allows him to participate more with therapy.      Function: trying to more on his own.      Cognitive impairment: continues to have difficulty with his memory.  Now working on remembering what he listened to and read with his SLP.  He is improving but still having difficulty with his memory.  He also has been doing homework from his SLP.  She notices him being impulsive and making unsafe choice such as leaving things out where he doesn't close cupboards where he may trip.  No falls.  He has been pushing limits by using drills and saws.  He has been learned to ride buses.     Sleep difficulty: has difficulty staying asleep.  Once he wakes up around 3-4 am and has trouble falling back asleep.  He tried melatonin for about a week but could not tell a difference.  He then able to fall back asleep few hours later and would take a nap.      Balance problem: working on outpatient PT on his balance.  He enjoys doing his home exercise program but tries to them all at once and makes it hard.  He talked to his PT about  getting a summary so that he has a format that he can use to remind himself which ones to do.    He needs to watch his feet when he is walking to not trip.  Has numbness in his feet that makes it more complicated.      Short temper: feels that his mood is getting better.  His wife still notices that he gets short with her often where he is irritable, yelling and obnoxious.     ROS:   Review of systems is as per HPI and below. All other systems reviewed and otherwise negative.    Constitutional: Reports waking secondary to decreased activity  GI: Reports diarrhea  MSK: Reports back pain  Neuro: Reports memory problem and balance problem; denies new sensory change or weakness  Psych: Reports sleep difficulty and short temper    Social History: Denies smoking, drinking alcohol, or using marijuana or other recreational drugs    Review of patient's allergies indicates:  Allergies   Allergen Reactions   . Bee Venom Hives, Itching and Swelling   . Gabapentin      Flu like symptoms, diarrhea, body ache upset stomach   . Lidocaine Rash     Lidocaine patch. PATIENT STATES HE IS ALLERGIC TO THE ADHESIVE NOT THE PATCH.     Outpatient Medications Prior to Visit  Medication Sig Dispense Refill   . Acetaminophen 500 MG Oral Tab 2 tablets bid     . AmLODIPine Besylate 2.5 MG Oral Tab 1 tablet bid dose increase 01/30/2016 180 tablet 3   . Atorvastatin Calcium 10 MG Oral Tab Take 1 tablet (10 mg) by mouth daily. 90 tablet 1   . Cholecalciferol (VITAMIN D3) 2000 units Oral Cap Take 1 capsule (2,000 Units) by mouth daily. For low level 1 capsule 1   . Clobetasol Propionate 0.05 % External Ointment Small amount to rash as needed bid for 1-2 weeks at a time 15 g 1   . Cyanocobalamin 1000 MCG Oral Tab one per day over-the-counter started 5/21 /2012 this level borderline low 1 Tab 1   . Diclofenac Sodium (VOLTAREN) 1 % Transdermal Gel Apply 4 g topically 4 times a day. Apply to neck, trapezius muscle, and low back. No more than 4 g applied  at a time. 1 Tube 5   . DULoxetine HCl 60 MG Oral CAPSULE ENTERIC COATED PARTICLES Take 1 capsule (60 mg) by mouth daily. 90 capsule 1   . EPINEPHrine 0.3 MG/0.3ML Injection Solution Auto-injector Inject as instructed per patient package insert, 0.3 mg intramuscularly or subcutaneously into the thigh, if needed to treat anaphylaxis 3 each 1   . Glucosamine-Chondroitin (GLUCOSAMINE CHONDR COMPLEX OR)      . Lidocaine 5 % External Patch Apply 1 patch onto the skin daily. Apply to painful area for up to 12 hours in a 24 hour period. 30 patch 10   . Lisinopril 20 MG Oral Tab Take 1 tablet (20 mg) by mouth every 12 hours. 180 tablet 1   . Melatonin 3 MG Oral Tab 2 at night as needed for sleep 1 tablet 1   . Meloxicam 7.5 MG Oral Tab 1-2 po per day as needed 60 tablet 1   . Primidone 50 MG Oral Tab 1 po bid 180 tablet 0   . THIAMINE HCL OR      . TURMERIC OR        No facility-administered medications prior to visit.      Physical Exam:  VS: BP (!) 145/99   Pulse 97   Temp 97 F (36.1 C) (Temporal)   Ht 6' 2.35" (1.889 m)   Wt (!) 225 lb 8.5 oz (102.3 kg)   SpO2 97%   BMI 28.68 kg/m  Ht 6' 2.354" (1.889 m) Wt 225 lb 8.5 oz (102.3 kg) Body mass index is 28.68 kg/m.   Pain: 0 (No Pain)    GEN: Normally developed, appearing stated age, neatly groomed and dressed in casual clothes, NAD.    HEAD: NC.  Atraumatic.   EYES: Sclera non-icteric, non-injected   EARS, NOSE, MOUTH, THROAT: Oropharynx clear, moist mucus membranes   PSYCH / COGNITIVE: appropriate mostly, cooperative, good eye contact.  Noted to have short temper and displays irritability towards his wife especially when she points out to him that he has previously asked the same questions to all his providers.   LOC : Alert    Assessment and Plan:  [name] is a 65 year old year old male now 3 months out from TBI with left thalamic IPH, SDH and facial fractures on 11/30/15 after a fall with persistent cognitive impairment and agitation.  Further  assessment/treatment recommended for the following issues:    1. Left thalamic IPH: improving function but struggling with his decreased independence.    - continue with rehab psychology to help  with coping    - continue with PT, OT and SLP    - counseled that he cannot be driving at this time given his short attention span, decreased attention to detail and slowed reaction time.  Will need to undergo on-road driving evaluation prior to returning to driving    - encouraged to continue to avoid alcohol at this time (recommended to not drink for at least one year from his brain injury)    2. Sleep difficulty: no change with melatonin.    - discontinue melatonin and see if any change to see if it's beneficial to continue     Return to clinic for follow-up: 3 months or earlier as needed    Follow-up with other providers as scheduled    At least 15 minutes was spent in face-to-face time in the care of the patient, of which more than 50% was spent in counseling and coordinating care  as outlined in this note.

## 2016-02-27 NOTE — Patient Instructions (Signed)
Continue current medications.  Monitor blood pressure ?

## 2016-03-05 ENCOUNTER — Ambulatory Visit: Payer: PPO | Attending: Neurology | Admitting: Rehabilitative and Restorative Service Providers"

## 2016-03-05 ENCOUNTER — Ambulatory Visit (HOSPITAL_BASED_OUTPATIENT_CLINIC_OR_DEPARTMENT_OTHER): Payer: PPO | Admitting: Speech-Language Pathologist

## 2016-03-05 DIAGNOSIS — Z8679 Personal history of other diseases of the circulatory system: Secondary | ICD-10-CM

## 2016-03-05 DIAGNOSIS — I619 Nontraumatic intracerebral hemorrhage, unspecified: Secondary | ICD-10-CM | POA: Insufficient documentation

## 2016-03-05 DIAGNOSIS — G8929 Other chronic pain: Secondary | ICD-10-CM

## 2016-03-05 DIAGNOSIS — R41841 Cognitive communication deficit: Secondary | ICD-10-CM

## 2016-03-05 DIAGNOSIS — M545 Low back pain, unspecified: Secondary | ICD-10-CM

## 2016-03-05 DIAGNOSIS — M25512 Pain in left shoulder: Secondary | ICD-10-CM | POA: Insufficient documentation

## 2016-03-05 DIAGNOSIS — R262 Difficulty in walking, not elsewhere classified: Secondary | ICD-10-CM | POA: Insufficient documentation

## 2016-03-05 DIAGNOSIS — R2689 Other abnormalities of gait and mobility: Secondary | ICD-10-CM | POA: Insufficient documentation

## 2016-03-05 DIAGNOSIS — M5489 Other dorsalgia: Secondary | ICD-10-CM | POA: Insufficient documentation

## 2016-03-05 DIAGNOSIS — M25511 Pain in right shoulder: Secondary | ICD-10-CM | POA: Insufficient documentation

## 2016-03-05 NOTE — Progress Notes (Signed)
Speech Therapy Progress Note    The following patient identifiers were confirmed today: name and date of birth.    General Assessment  Date of Symptom Onset : 11/30/15  Start of Care Date: 01/23/16  Reason for Referral: cognitive communication impairment  Pain Score: 0 - No pain  Have you fallen in the past year?: Yes  Fall Screen Date: 01/23/16  Interpreter Status: Not Needed      Visits from start of care: 6    Referring Provider: Royce Macadamia*    Treatment/Therapy diagnosis:   Encounter Diagnoses   Name Primary?   [name] Hx of spontan intraparenchymal intracran bleed assoc with hypertension Yes   . Cognitive communication deficit          SUBJECTIVE:   Patient Statement: Ronnie Mason was on time to the appointment and participated well throughout the session.  He was accompanied by his wife, who was present for the entire session.  He reports that he met with Dr. Patsi Sears last week and did follow through with suggestion to write out questions prior to that appointment.                      TREATMENT & EDUCATION:   Time in: 9:00 am     Duration: 60 minutes     Interventions/Treatment provided:    Cognitive skills Tx    Memory:  Ronnie Mason reports that he is continuing to use his planner, but did not have it with him today.   Reviewed notes from last session and spent time working on taking more effective notes.  Provided training in strategies for note taking such as dating, putting a title and reviewing to make sure that notes are meaningful for later use.  He required moderate cues to write effective notes during session.      Attention/planning/organizing:  Completed several exercises to organize information that was embedded in a paragraph into a schedule.  He required moderate cues on the first two exercises to develop and utilize strategies to take notes, attend to details, and focus on smaller amounts of information at a time.  On the final exercise, he was able to complete the task with only minimal  cues.    ASSESSMENT OF TREATMENT: Ronnie Mason verbalized good understanding of strategies that were taught and practiced today for increased note taking as well as improved attention in complex reasoning.  He continues to benefit from use of strategies to focus on one item at a time and minimize external distractions.  He is aware of difficulties with attention and memory and is anxious to improve note-taking as well as other cognitive skills.       DISCHARGE GOALS:    Goal #1: Pt will develop and utilize strategies for increased recall of daily information with minimal to no cues.  Time frame:  April 02, 2016  Progress toward goal:  Continued to develop and train in strategies for improved recall and follow through.    Goal #2: Pt will develop and utilize strategies for improved attention and problem solving with minimal cues in moderately complex reasoning tasks.  Time frame:  April 02, 2016  Progress toward goal:  Continued to develop and train in strategies for improved attention and problem solving.        Rehabilitation potential is: Good  Potential Barriers to achieve rehab goals: memory  These goals were discussed and the patient agrees with them.    Plan of care: Ronnie Mason will work on  using his daily planner and to do lists for making a plan each day and then following that plan.  He will also continue to practice listening to 2-3 TED talks over the week and practice note taking. He will return the therapy next week to continue POT.        Ronnie Cocking Simon [place], MS, CCC-SLP, BC-ANCDS

## 2016-03-05 NOTE — Progress Notes (Signed)
Physical Therapy Progress Note  This note serves as a CMS evaluation form that requires the referring provider to certify the need for therapy services furnished under this Plan of Treatment. The signing provider is certifying the plan of care.    The following patient identifiers were confirmed today: name and date of birth.    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   Treatment/Therapy diagnosis:   Encounter Diagnoses   Name Primary?   . Intraparenchymal hemorrhage of brain ([place]) Yes   . Back pain, lumbosacral    . Chronic pain of both shoulders    . Balance problem      Precautions: None          PERTINENT MEDICAL/SURGICAL HISTORY:   Past Medical History:   Diagnosis Date   . Carotid Sinus Hypersensitivity    . HYPERTENSION     . HYPOTENSION     . Intraparenchymal hemorrhage of brain ([place]) 2017   . Pain of right hip joint 01/23/2016   . 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 Syncope    . 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([place])    . URI (upper respiratory infection)       X-ray findings: 02/19/2016  RIGHT SIDE: Moderate glenohumeral joint osteoarthropathy. Mild elevation of   the humeral head. Prominent spur the acromion nearly contacts the humeral   head. AC joint mild proliferation. Coracoid unremarkable. No fracture.   Probable osteopenia accounting for subtle lucencies in the proximal humerus.    LEFT SIDE: Mild lateral humeral joint osteoarthropathy.    Spur the acromion. AC joint mild proliferation. Coracoid unremarkable. No   fracture. No loose body.    Past Surgical History:   Procedure Laterality Date   . ANES; COLONOSCOPY  2002    repeat in 3 years   . ANES; COLONOSCOPY & POLYPECTOMY  11/18/2007    repeat in 3 years   .  FEMUR/KNEE SURG UNLISTED     . HAND/FINGER SURGERY UNLISTED     . SPINE SURGERY PROCEDURE UNLISTED  2013    rfa to c3 to c 7      OBJECTIVE MEASURES  -Attention/Cognition/Speech: Difficulty with short term memory which has improved since going home.   -Posture: forward head, rounded shoulders, R shoulder elevated compared to L,falttening in his lumbar spine.   -Sensation: Intact to light touch per report except numbness in the lateral calf to the bottom of the feet  -Tone assessment: (Measured via MAS): 0/4 in B hamstrings, quads, and calves    LE Strength: 01/23/2016    L LE (_/5) R LE (_/5)   Hip Flexion 4 4- (gives away due to hip pain)   Hip Ext 4 4   Hip Abduction 4 4   Knee Extension 5 5   Knee Flexion 5 5   Ankle Dorsiflexion 4 4     UE Strength: 01/23/2016    L UE (_/5) R UE (_/5)   Shoulder Abduction 4- (c/o shoulder pain) 4- (c/o shoulder pain)   Shoulder Flexion 4 4   Shoulder IR 5 5   Shoulder ER 4 4   Elbow Flexion 5 5   Elbow Extension 5 5  Range of Motion:  -Slight tightness in hamstring and hip flexors  -Otherwise WFL    -AROM in degrees: 01/30/2016   R shoulder flexion: 105   L shoulder flexion: 75   R shoulder abduction: 110   L shoulder abduction: 75    -AROM in degrees: 03/05/2016   R shoulder flexion: 135   L shoulder flexion: 110   R shoulder abduction: 130   L shoulder abduction: 85    -PROM in degrees: 01/30/2016   R shoulder flexion: 120    L shoulder flexion: 130   R shoulder abduction: 115   L shoulder abduction: 75     -Ober's test positive on R    -Functional Mobility:    -Bed mobility: Independent with increased time due to back and shoulder pain    -Transfers: Independent     -Gait: good foot clearance, narrow BOS, decreased arm swing on the R compared to the L     -Balance:   Mini Balance Evaluation Systems Test (Mini BEST test)    1. Sit to stand: 2  2. Rise to toes: 1  3. Stand on one leg: 1  Left: 1  Right: 1  4. Compensatory stepping correction - forward: 1  5. Compensatory stepping  correction - backward:0  6. Compensatory stepping correction - lateral: 0  Left: 0  Right: 1  7. Stance (feet together); eyes open, firm surface: 2  8. Stance (feet together); eyes closed, foam surface: 2  9. Incline - Eyes closed: 2  10.  Change in gait speed: 2  11.  Walk with head turns - horizontal: 1  12.  Walk with pivot turns: 2  13.  Step over obstacles: 2  14.  Timed up and go with dual task: 1  TUG: 08.59  TUG with dual task: 10.35    Initial Score:  19/28 (01/23/2016)  Most recent score:      Score interpretation:   Chronic Stroke - Francie Massing et al 2013): Scores of ? 17.5 identified those with a history of falling, sensitivity 64%, specificity 64% AUC 0.64  Parkinson's Disease: - Scores of ? 19 identified recurrent fallers; sensitivity 79%, specificity 67% AUC 0.75 (King et al 2012). Scores of ? 21 differentiate those with and without postural response deficits with a sensitivity of 89%, specificity 81%    Modified CTSIB    1. Stand on firm surface with eyes open: 30/30 sec  2. Stand on firm surface with eyes closed: 30/30 sec  3. Stand on foam with eyes open: 30/30 sec  4. Stand on foam with eyes closed: 30/30 sec (significant increase in sway)    Initial score.  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 He has been doing his exercises daily.   Have you fallen since your last visit? No  Pain: 3/10 in low back    1. Manual Therapy for 25 minutes  -Manual stretching for upper trap and levator.   -STM to upper trap  -Trigger point release along upper trap and levator  -Traction to B arms.   -STM to B pecs.     -AROM in degrees: 03/05/2016   R shoulder flexion: 135   L shoulder  flexion: 110   R shoulder  abduction: 130   L shoulder abduction: 85    2. Therapeutic Exercise for 20 minutes   -Supine single leg bridges. 10x on each side.  -Supine bridges on a therapy ball.  10x on each side.   -Lumbar rotation over a therapy ball. 10x on each side.   -Posterior pelvic tilt with deep breathing.  10x2.   -Posterior pelvic tilt with heel slides. 10x1.   -Reviewed his shoulder exercises and reinforced his exercise program.    -Discussed his walking program and to increase his speed to get a better cardiovascular work out.     -Educated about using B pillows in supine underneath his arm to support him if he is sleeping in supine.  -Continued to reinforce good posture to decrease strain on his shoulders.     Home exercise instruction: verbal instructions and demonstrations were provided for the above exercises  a handout was provided describing the exercises in written and picture format  The patient was able to demonstrate the exercises after instructions.    -Hip flexor stretch  -Cervical side bend stretch    -Wall angles with shoulder retraction .   -Shoulder ER with red band.    -Shoulder Rows with a red band.   -Shoulder punches with a red band.   -GH inferior glide.   -Doorway pec stretch.   -Posterior pelvic tilt with heel slides   -Single leg bridges  -Lumbar rotation over the therapy ball.     -Narrow BOS with eyes closed with head turns.   -Semi-tandem stance   -Heel toe walking  -[place] walking     ASSESSMENT OF TREATMENT PROVIDED TODAY: Pt showing great progress with improved shoulder ROM with stretching and exercises.  Able to work on core strengthening today as well to help with his back pain and core stability..  Continued to need reinforcement on posture in order to help with his shoulders and back pain.     DISCHARGE GOALS  to be met by:  04/22/2016  Patient Goals: Improve his balance     Functional Discharge      Goals Short Term Goals Impairments Current Level of Function /   Progress Toward Goal   1. Pt  will improve his MiniBEST test to 23/28 to demonstrate improved balance.     2. Pt will be report his pain in his shoulder on average being a 2/10.     3. Pt will be able to sit with good posture without cueing. A. Pt will be independent in his HEP.     B. Pt will be able to perform dual task DGI with only a 10% time difference between each other. Weakness   Decreased sensation  Decreased balance 1.Not addressed today    2. Worked on his shoulder today with improvements in ROM.    3. Needing cueing throughout session.     A.Continued to reinforce    B. Not addressed     These goals were discussed and the patient agrees with them.    PLAN OF CARE:  This patient will be seen for 1 therapy sessions per week over a 90 day time frame.  The patient will perform the instructed home program 2-3 times per day.  The therapy will include therapeutic exercise, manual therapy, ultrasound, therapeutic activities, neuromuscular reeducation and gait training    Intervention for next treatment:   -BP and aerobic exercise   -Core strengthening and stabilization exercises   -  Follow up on stretching program  -Progress dynamic balance  -Address his IT band.  -Educate on safety with posture.    Morley Kos PT, DPT  [facility]   Physical Therapy   [phone] (Pager)     Referring Physician/Practitioner Certification Statement:   I certify the need for these services furnished under this plan of treatment and while under my care.      Signature:__________________________________Date:___________________

## 2016-03-12 ENCOUNTER — Ambulatory Visit (HOSPITAL_BASED_OUTPATIENT_CLINIC_OR_DEPARTMENT_OTHER): Payer: PPO | Admitting: Rehabilitative and Restorative Service Providers"

## 2016-03-12 ENCOUNTER — Telehealth (INDEPENDENT_AMBULATORY_CARE_PROVIDER_SITE_OTHER): Payer: Self-pay | Admitting: Internal Medicine

## 2016-03-12 ENCOUNTER — Ambulatory Visit (HOSPITAL_BASED_OUTPATIENT_CLINIC_OR_DEPARTMENT_OTHER): Payer: PPO | Admitting: Speech-Language Pathologist

## 2016-03-12 DIAGNOSIS — I619 Nontraumatic intracerebral hemorrhage, unspecified: Secondary | ICD-10-CM

## 2016-03-12 DIAGNOSIS — Z789 Other specified health status: Secondary | ICD-10-CM

## 2016-03-12 DIAGNOSIS — Z8679 Personal history of other diseases of the circulatory system: Secondary | ICD-10-CM

## 2016-03-12 DIAGNOSIS — Z7409 Other reduced mobility: Secondary | ICD-10-CM

## 2016-03-12 DIAGNOSIS — G8929 Other chronic pain: Secondary | ICD-10-CM

## 2016-03-12 DIAGNOSIS — R41841 Cognitive communication deficit: Secondary | ICD-10-CM

## 2016-03-12 DIAGNOSIS — M25511 Pain in right shoulder: Secondary | ICD-10-CM

## 2016-03-12 DIAGNOSIS — R262 Difficulty in walking, not elsewhere classified: Secondary | ICD-10-CM | POA: Insufficient documentation

## 2016-03-12 NOTE — Progress Notes (Signed)
Physical Therapy Progress Note  This note serves as a CMS evaluation form that requires the referring provider to certify the need for therapy services furnished under this Plan of Treatment. The signing provider is certifying the plan of care.    The following patient identifiers were confirmed today: name and date of birth.    Certification From: 16/10/96  Certification To: 04/54/09    Date of Symptom Onset : 11/30/15  Start of Care Date: 01/23/16    Visits from Start of Episode: 7    Referring Provider: Royce Macadamia*    NPI: [id number]  Reason for Referral: 65 year old male with hypertensive L thalamic ICH.   Treatment/Therapy diagnosis:   Encounter Diagnoses   Name Primary?   . Chronic pain of both shoulders Yes   . Intraparenchymal hemorrhage of brain ([place])    . Difficulty in walking, not elsewhere classified      Precautions: None          PERTINENT MEDICAL/SURGICAL HISTORY:   Past Medical History:   Diagnosis Date   . Carotid Sinus Hypersensitivity    . HYPERTENSION     . HYPOTENSION     . Intraparenchymal hemorrhage of brain ([place]) 2017   . Pain of right hip joint 01/23/2016   . 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 Syncope    . 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([place])    . URI (upper respiratory infection)       X-ray findings: 02/19/2016  RIGHT SIDE: Moderate glenohumeral joint osteoarthropathy. Mild elevation of   the humeral head. Prominent spur the acromion nearly contacts the humeral   head. AC joint mild proliferation. Coracoid unremarkable. No fracture.   Probable osteopenia accounting for subtle lucencies in the proximal humerus.    LEFT SIDE: Mild lateral humeral joint osteoarthropathy.    Spur the acromion. AC joint mild proliferation. Coracoid unremarkable. No   fracture. No loose body.    Past Surgical History:   Procedure Laterality Date   . ANES; COLONOSCOPY  2002    repeat in 3 years   . ANES; COLONOSCOPY & POLYPECTOMY  11/18/2007    repeat in 3 years    . FEMUR/KNEE SURG UNLISTED     . HAND/FINGER SURGERY UNLISTED     . SPINE SURGERY PROCEDURE UNLISTED  2013    rfa to c3 to c 7      OBJECTIVE MEASURES  -Attention/Cognition/Speech: Difficulty with short term memory which has improved since going home.   -Posture: forward head, rounded shoulders, R shoulder elevated compared to L,falttening in his lumbar spine.   -Sensation: Intact to light touch per report except numbness in the lateral calf to the bottom of the feet  -Tone assessment: (Measured via MAS): 0/4 in B hamstrings, quads, and calves    LE Strength: 01/23/2016    L LE (_/5) R LE (_/5)   Hip Flexion 4 4- (gives away due to hip pain)   Hip Ext 4 4   Hip Abduction 4 4   Knee Extension 5 5   Knee Flexion 5 5   Ankle Dorsiflexion 4 4     UE Strength: 01/23/2016    L UE (_/5) R UE (_/5)   Shoulder Abduction 4- (c/o shoulder pain) 4- (c/o shoulder pain)   Shoulder Flexion 4 4   Shoulder IR 5 5   Shoulder ER 4 4   Elbow Flexion 5 5   Elbow Extension 5 5  Range of Motion:  -Slight tightness in hamstring and hip flexors  -Otherwise WFL    -AROM in degrees: 01/30/2016   R shoulder flexion: 105   L shoulder flexion: 75   R shoulder abduction: 110   L shoulder abduction: 75    -AROM in degrees: 03/05/2016   R shoulder flexion: 135   L shoulder flexion: 110   R shoulder abduction: 130   L shoulder abduction: 85    -PROM in degrees: 01/30/2016   R shoulder flexion: 120    L shoulder flexion: 130   R shoulder abduction: 115   L shoulder abduction: 75     -Ober's test positive on R    -Functional Mobility:    -Bed mobility: Independent with increased time due to back and shoulder pain    -Transfers: Independent     -Gait: good foot clearance, narrow BOS, decreased arm swing on the R compared to the L     -Balance:   Mini Balance Evaluation Systems Test (Mini BEST test)    1. Sit to stand: 2  2. Rise to toes: 1  3. Stand on one leg: 1  Left: 1  Right: 1  4. Compensatory stepping correction - forward: 2  5. Compensatory  stepping correction - backward:2  6. Compensatory stepping correction - lateral: 2  Left: 2  Right: 2  7. Stance (feet together); eyes open, firm surface: 2  8. Stance (feet together); eyes closed, foam surface: 2  9. Incline - Eyes closed: 2  10.  Change in gait speed: 2  11.  Walk with head turns - horizontal: 1  12.  Walk with pivot turns: 2  13.  Step over obstacles: 2  14.  Timed up and go with dual task: 1  TUG: 08.59  TUG with dual task: 10.35    Initial Score:  19/28 (01/23/2016)  Most recent score:  23/28 (03/12/2016)    Score interpretation:   Chronic Stroke - Francie Massing et al 2013): Scores of ? 17.5 identified those with a history of falling, sensitivity 64%, specificity 64% AUC 0.64  Parkinson's Disease: - Scores of ? 19 identified recurrent fallers; sensitivity 79%, specificity 67% AUC 0.75 (King et al 2012). Scores of ? 21 differentiate those with and without postural response deficits with a sensitivity of 89%, specificity 81%    Modified CTSIB    1. Stand on firm surface with eyes open: 30/30 sec  2. Stand on firm surface with eyes closed: 30/30 sec  3. Stand on foam with eyes open: 30/30 sec  4. Stand on foam with eyes closed: 30/30 sec (significant increase in sway)    Initial score.  120  out of 120 sec (01/23/2016)  Most recent score:   out of 120 sec  ______________________________________________________________________________    03/12/2016    TREATMENT & EDUCATION:  Time in: 0815 Total Treatment time/minutes: 45    SUBJECTIVE:  He continues to do his stretches every day but has been working a lot on different projects. His shoulders have still been having trouble.   Have you fallen since your last visit? No  Pain: 3-4/10 in B shoulders    1. Manual Therapy for 15 minutes  -Inferior GH glide with shoulder in slight abduction.  Needed to start gradually as pt was having a lot of pain with this.   -GH traction through his arm.   -Posterior GH glide with shoulder slightly abduction.   -Pt showing full  AROM in shoulder flexion  with treatment and significantly improved shoulder abduction range with treatment as well.   -Taught his wife on how to provide inferior glide with his arm abducted on the table.    2.Therapeutic Activity for 15 minutes  -Performed MiniBEST test.  See score above for details.     3. Therapeutic Exercise for 15 minutes   -Reviewed his HEP.  Continued to reinforce his shoulder exercises.  Pt has been performing mostly his shoulder exercises but not his balance exercises.   -Standing heel raises 10x.   -Static balance on an anterior/posterior big tilt board.  Progressed to perturbation.  Progressed to weighted ball toss up and down to himself.   -Standing toe raises. 10x.   -Walking on his heels.  -Walking on his toes  -SLS   -Tandem stance.    Home exercise instruction: verbal instructions and demonstrations were provided for the above exercises  a handout was provided describing the exercises in written and picture format  The patient was able to demonstrate the exercises after instructions.    -Hip flexor stretch  -Cervical side bend stretch    -Wall angles with shoulder retraction .   -Shoulder ER with red band.    -Shoulder Rows with a red band.   -Shoulder punches with a red band.   -GH inferior glide.   -Doorway pec stretch.   -Posterior pelvic tilt with heel slides   -Single leg bridges  -Lumbar rotation over the therapy ball.     -Narrow BOS with eyes closed with head turns on foam   -Tandem stance   -Balance on tip toes  -Heel toe walking  -Grapevine walking     ASSESSMENT OF TREATMENT PROVIDED TODAY: Pt shows great improvement in ROM with therapy but has poor posture and mechanics perpetuating his shoulder pain.  Shows improvement in his balance as demonstrated by improvement of his MiniBEST test. Continues to require skilled PT which will shift focus to higher level balance.       DISCHARGE GOALS  to be met by:  04/22/2016  Patient Goals: Improve his balance     Functional Discharge       Goals Short Term Goals Impairments Current Level of Function /   Progress Toward Goal   1. Pt will improve his MiniBEST test to 23/28 to demonstrate improved balance.     2. Pt will be report his pain in his shoulder on average being a 2/10.     3. Pt will be able to sit with good posture without cueing. A. Pt will be independent in his HEP.     B. Pt will be able to perform dual task DGI with only a 10% time difference between each other. Weakness   Decreased sensation  Decreased balance 1. GOAL MET    2. Worked on his shoulder today with improvements in ROM. Average pain is 3-4/10    3. Needing cueing throughout session.     A.Continued to reinforce    B. Not addressed     These goals were discussed and the patient agrees with them.    PLAN OF CARE:  This patient will be seen for 1 therapy sessions per week over a 90 day time frame.  The patient will perform the instructed home program 2-3 times per day.  The therapy will include therapeutic exercise, manual therapy, ultrasound, therapeutic activities, neuromuscular reeducation and gait training    Intervention for next treatment:   -BP and aerobic exercise   -Progress dynamic  balance      Morley Kos PT, DPT  [facility] Department   Physical Therapy   [phone] (Pager)     Referring Physician/Practitioner Certification Statement:   I certify the need for these services furnished under this plan of treatment and while under my care.      Signature:__________________________________Date:___________________

## 2016-03-12 NOTE — Patient Instructions (Addendum)
Balance Exercises:    *Perform this in the corner for safety.   -Feet together. Eyes closed. Standing on a foam cushion.  Progress this by adding in horizontal head turns.     -Standing on one leg. Goal is to get to 20-30 seconds.     -Standing on your tip toes.  Goal is to get to 10 seconds as high as you can.      -Heel toe stance on flat ground.    Walking Balance Exercises :     -Grapevine walking: Over-out-behind-out. You should be working on your balance when you cross your feet. Do this along a counter where you can hold onto something.     -Heel toe walking along a line.

## 2016-03-12 NOTE — Telephone Encounter (Signed)
-----   Message from Cleatrice BurkeMary P Mason, OT sent at 03/12/2016 10:53 AM PDT -----  Regarding: mutual patient  Hi Dr. Marisa SprinklesKraft,     I have working with Ronnie Mason in outpatient occupational therapy. During his visit today, he mentioned waking up with fisted hands with subsequent pain. It takes awhile to progressively open them up. I could certainly give him night splints if you think that would be indicated. It looks like you have an appointment to see him on 8/28. Just a heads up. Thank you,     Ronnie Mason, OTR/L

## 2016-03-12 NOTE — Progress Notes (Signed)
Speech Therapy Progress Note    The following patient identifiers were confirmed today: name and date of birth.    General Assessment  Date of Symptom Onset : 11/30/15  Start of Care Date: 01/23/16  Reason for Referral: cognitive communication impairment  Pain Score: 0 - No pain  Have you fallen in the past year?: Yes  Fall Screen Date: 01/23/16  Interpreter Status: Not Needed      Visits from start of care: 7    Referring Provider: Mikey Bussingavis, Arielle Patricia*    Treatment/Therapy diagnosis:   Encounter Diagnoses   Name Primary?   [name]. 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 Cognitive communication deficit          SUBJECTIVE:   Patient Statement: Onalee HuaDavid was on time to the appointment and participated well throughout the session.  He was accompanied by his wife, who was present for the entire session.  He had followed through with homework tasks and brought that with him today.  He did not have any new issues.                       TREATMENT & EDUCATION:   Time in: 9:05 am     Duration: 55 minutes     Interventions/Treatment provided:    Cognitive skills Tx    Memory:  Onalee HuaDavid reports that he is continuing to use his planner and had that with him today.   Reviewed information that he is writing in planner and provided with additional items that he could put in his planner as well.  Also reviewed notes that he had taken for homework and provided feedback and education on changes he could make to note taking for increased comprehension and recall.   He initiated taking a note at the end of the session for increased recall and follow through of homework assignment.    Attention/planning/organizing:  Completed exercise to organize information into a diagram based upon given parameters.  He was prompted to review the information and make a plan first and then begin to work on the task.  He was then able to implement strategies for planning and note taking to be able to complete the task  accurately.  Also completed task to read through complex passage and answer questions in which he needed to make inferences and draw conclusions.  He was able to choose the correct answer to complex multiple choice questions, but was unable to provide a rationale for why his response was the correct answer.  Also required moderate cues to describe the main point of the article and connect the ideas together.      ASSESSMENT OF TREATMENT: Onalee HuaDavid verbalized good understanding of strategies that were taught and practiced today for increased note taking as well as improved attention in complex reasoning.  He continues to benefit from use of strategies to focus on one item at a time and minimize external distractions.         DISCHARGE GOALS:    Goal #1: Pt will develop and utilize strategies for increased recall of daily information with minimal to no cues.  Time frame:  April 02, 2016  Progress toward goal:  Continued to develop and train in strategies for improved recall and follow through.    Goal #2: Pt will develop and utilize strategies for improved attention and problem solving with minimal cues in moderately complex reasoning tasks.  Time frame:  April 02, 2016  Progress toward goal:  Continued to develop and train in strategies for improved attention and problem solving.        Rehabilitation potential is: Good  Potential Barriers to achieve rehab goals: memory  These goals were discussed and the patient agrees with them.    Plan of care: Korvin will work on using his daily planner and to do lists for making a plan each day and then following that plan.  He will also continue to practice listening to 2-3 TED talks over the week and practice note taking. He will return the therapy next week to continue POT.        Zenaida Deed Mayumi Summerson, MS, CCC-SLP, BC-ANCDS

## 2016-03-12 NOTE — Progress Notes (Signed)
Occupational Therapy Progress Note    The following patient identifiers were confirmed today: name and date of birth.        Onset: 11/30/15  Visits from start of Episode: 2    Referring Provider: Mikey Mason, Ronnie Mason*       Treatment/Therapy diagnosis:   Encounter Diagnoses   Name Primary?   . Intraparenchymal hemorrhage of brain ([facility]) Yes   . Impaired mobility and ADLs      Precautions: none    PERTINENT MEDICAL/SURGICAL HISTORY:   Past Medical History:   Diagnosis Date   . Carotid Sinus Hypersensitivity    . HYPERTENSION     . HYPOTENSION     . Intraparenchymal hemorrhage of brain ([facility]) 2017   . Pain of right hip joint 01/23/2016   . 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 Syncope    . 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([facility])    . URI (upper respiratory infection)         Past Surgical History:   Procedure Laterality Date   . ANES; COLONOSCOPY  2002    repeat in 3 years   . ANES; COLONOSCOPY & POLYPECTOMY  11/18/2007    repeat in 3 years   . UNLISTED PROCEDURE FEMUR/KNEE     . UNLISTED PROCEDURE HANDS/FINGERS     . UNLISTED PROCEDURE SPINE  2013    rfa to c3 to c 7        Previous interventions: Discharged to home on 12/10/15 with intermittent supervision from his wife and started home PT, OT, and SLP.  Has been participating well with the therapies.       Home Environment: Ronnie Mason lives with his wife Ronnie Mason in a 3 story home including basement (where the laundry, office and bathroom are). Master bath has tub/shower combination. He uses a walk in shower in the bathroom he uses and has a standard height toilet there. His wife has an ADA toilet in the master bath. His wife works 3-4 days per week and provides intermittent supervision.    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 Ronnie Mason has two adult children that live in the area and 2 grandchildren    Fall Risk: The patient fell on 11/30/15 which led to his hospitilization but none since then.     SUBJECTIVE:   Patient  Statement: Ronnie Mason arrived on time and accompanied by his wife [name] today.   He had radiofrequency ablation to his low back to treat pain.  His right thumb pain is essentially gone but has shoulder pain.   He is sleeping better at night.  Working on balance with PT.  Wakes up with fisted hands with subsequent pain.       The patient is reporting 5-6/10 level of pain now, on a 0-10 Numeric Pain Scale. Location is in right foot and right thumb. Swelling and pain has gone down in his right thumb. He occasionally wears a hand-based thumb splint.    Aggravating factors: walking long distances. Alleviating factors: ice, rest. Also has decades of chronic back pain. Having pain in both shoulders during active ROM into shoulder abduction.      Barriers to learning: memory                   Preferred Learning style: seeing    TREATMENT & EDUCATION:    Interventions/Treatment provided:      1. Therapeutic activities for 45 minutes  Gathered the following additional data today  and retested him on the 9 hole peg test with the following results:  9 Hole Peg Test:    R hand: 21.88 seconds (M=20.3, SD=2.6)      L hand: 20.09 seconds (M=21.0, SD=2.5)    ROM:  Right wrist flexion 45 degrees to extension 45 degrees  Left wrist flexion measured at 50 degrees to extension 50 degrees    With respect to Ronnie Mason waking up with fisted hands, I cannot appreciate any spasticity in his wrists or fingers but will ask his MD about doing night splints to manage this.     Advised patient to avoid using power tools to minimize shoulder pain and fatigue.     ASSESSMENT: Ronnie Mason's fine motor speed has improved upon retesting today, as measured by the 9 hole peg test. May issue and fit with hand splints based on fisted hands in the morning. Will continue to advise.     These goals were discussed and the patient actively participated in developing them.     DISCHARGE GOALS      Functional Discharge     Goals Short Term Goals Current Level of Function /    Progress Toward Goal Test/Outcome Measure Score & Date   1. Patient will report increased activity tolerance / productivity / IADL participation within 3 months' time.    Patient will report increased activity tolerance / productivity / IADL participation within 2 months' time.  Patient reports improved sleep with subsequent improvement in productivity in the daytime.   Patient report    COPM     2. Patient will complete home exercise program aimed to manage pain, increase UE strength and coordination within 3 months' time.  Initiate HEP for B UEs. Coordination improved, please see above note.  9 hole peg test     Plan of care:   This patient will be seen for an additional session to fit with splints if needed.      The therapy will include: Self care/ADL Management, Therapeutic Activities, Therapeutic Exercise, Cognitive Skills Development    Maxie Barb, OTR/L

## 2016-03-12 NOTE — Telephone Encounter (Signed)
Will address at visit.

## 2016-03-13 ENCOUNTER — Encounter (INDEPENDENT_AMBULATORY_CARE_PROVIDER_SITE_OTHER): Payer: Self-pay | Admitting: Internal Medicine

## 2016-03-19 ENCOUNTER — Ambulatory Visit (HOSPITAL_BASED_OUTPATIENT_CLINIC_OR_DEPARTMENT_OTHER): Payer: PPO | Admitting: Speech-Language Pathologist

## 2016-03-19 ENCOUNTER — Ambulatory Visit (HOSPITAL_BASED_OUTPATIENT_CLINIC_OR_DEPARTMENT_OTHER): Payer: PPO | Admitting: Rehabilitative and Restorative Service Providers"

## 2016-03-19 DIAGNOSIS — M25511 Pain in right shoulder: Secondary | ICD-10-CM

## 2016-03-19 DIAGNOSIS — R41841 Cognitive communication deficit: Secondary | ICD-10-CM

## 2016-03-19 DIAGNOSIS — Z8679 Personal history of other diseases of the circulatory system: Secondary | ICD-10-CM

## 2016-03-19 DIAGNOSIS — I619 Nontraumatic intracerebral hemorrhage, unspecified: Secondary | ICD-10-CM

## 2016-03-19 DIAGNOSIS — G8929 Other chronic pain: Secondary | ICD-10-CM

## 2016-03-19 DIAGNOSIS — R262 Difficulty in walking, not elsewhere classified: Secondary | ICD-10-CM

## 2016-03-19 DIAGNOSIS — M545 Low back pain, unspecified: Secondary | ICD-10-CM

## 2016-03-19 NOTE — Progress Notes (Signed)
Physical Therapy Progress Note  This note serves as a CMS evaluation form that requires the referring provider to certify the need for therapy services furnished under this Plan of Treatment. The signing provider is certifying the plan of care.    The following patient identifiers were confirmed today: name and date of birth.    Certification From: 90/24/09  Certification To: 73/53/29    Date of Symptom Onset : 11/30/15  Start of Care Date: 01/23/16    Visits from Start of Episode: 8    Referring Provider: Royce Macadamia*    NPI: [id number]  Reason for Referral: 65 year old male with hypertensive L thalamic ICH.   Treatment/Therapy diagnosis:   Encounter Diagnoses   Name Primary?   . Difficulty in walking, not elsewhere classified Yes   . Chronic pain of both shoulders    . Back pain, lumbosacral    . Intraparenchymal hemorrhage of brain (HCC)      Precautions: None          PERTINENT MEDICAL/SURGICAL HISTORY:   Past Medical History:   Diagnosis Date   . Carotid Sinus Hypersensitivity    . HYPERTENSION     . HYPOTENSION     . Intraparenchymal hemorrhage of brain ([place]) 2017   . Pain of right hip joint 01/23/2016   . 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 Syncope    . 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([place])    . URI (upper respiratory infection)       X-ray findings: 02/19/2016  RIGHT SIDE: Moderate glenohumeral joint osteoarthropathy. Mild elevation of   the humeral head. Prominent spur the acromion nearly contacts the humeral   head. AC joint mild proliferation. Coracoid unremarkable. No fracture.   Probable osteopenia accounting for subtle lucencies in the proximal humerus.    LEFT SIDE: Mild lateral humeral joint osteoarthropathy.    Spur the acromion. AC joint mild proliferation. Coracoid unremarkable. No   fracture. No loose body.    Past Surgical History:   Procedure Laterality Date   . ANES; COLONOSCOPY  2002    repeat in 3 years   . ANES; COLONOSCOPY & POLYPECTOMY   11/18/2007    repeat in 3 years   . UNLISTED PROCEDURE FEMUR/KNEE     . UNLISTED PROCEDURE HANDS/FINGERS     . UNLISTED PROCEDURE SPINE  2013    rfa to c3 to c 7      OBJECTIVE MEASURES  -Attention/Cognition/Speech: Difficulty with short term memory which has improved since going home.   -Posture: forward head, rounded shoulders, R shoulder elevated compared to L,falttening in his lumbar spine.   -Sensation: Intact to light touch per report except numbness in the lateral calf to the bottom of the feet  -Tone assessment: (Measured via MAS): 0/4 in B hamstrings, quads, and calves    LE Strength: 01/23/2016    L LE (_/5) R LE (_/5)   Hip Flexion 4 4- (gives away due to hip pain)   Hip Ext 4 4   Hip Abduction 4 4   Knee Extension 5 5   Knee Flexion 5 5   Ankle Dorsiflexion 4 4     UE Strength: 01/23/2016    L UE (_/5) R UE (_/5)   Shoulder Abduction 4- (c/o shoulder pain) 4- (c/o shoulder pain)   Shoulder Flexion 4 4   Shoulder IR 5 5   Shoulder ER 4 4   Elbow Flexion 5 5   Elbow  Extension 5 5     Range of Motion:  -Slight tightness in hamstring and hip flexors  -Otherwise WFL    -AROM in degrees: 01/30/2016   R shoulder flexion: 105   L shoulder flexion: 75   R shoulder abduction: 110   L shoulder abduction: 75    -AROM in degrees: 03/05/2016   R shoulder flexion: 135   L shoulder flexion: 110   R shoulder abduction: 130   L shoulder abduction: 85    -PROM in degrees: 01/30/2016   R shoulder flexion: 120    L shoulder flexion: 130   R shoulder abduction: 115   L shoulder abduction: 75     -Ober's test positive on R    -Functional Mobility:    -Bed mobility: Independent with increased time due to back and shoulder pain    -Transfers: Independent     -Gait: good foot clearance, narrow BOS, decreased arm swing on the R compared to the L     -Balance:   Mini Balance Evaluation Systems Test (Mini BEST test)    1. Sit to stand: 2  2. Rise to toes: 1  3. Stand on one leg: 1  Left: 1  Right: 1  4. Compensatory stepping correction -  forward: 2  5. Compensatory stepping correction - backward:2  6. Compensatory stepping correction - lateral: 2  Left: 2  Right: 2  7. Stance (feet together); eyes open, firm surface: 2  8. Stance (feet together); eyes closed, foam surface: 2  9. Incline - Eyes closed: 2  10.  Change in gait speed: 2  11.  Walk with head turns - horizontal: 1  12.  Walk with pivot turns: 2  13.  Step over obstacles: 2  14.  Timed up and go with dual task: 1  TUG: 08.59  TUG with dual task: 10.35    Initial Score:  19/28 (01/23/2016)  Most recent score:  23/28 (03/12/2016)    Score interpretation:   Chronic Stroke - Francie Massing et al 2013): Scores of ? 17.5 identified those with a history of falling, sensitivity 64%, specificity 64% AUC 0.64  Parkinson's Disease: - Scores of ? 19 identified recurrent fallers; sensitivity 79%, specificity 67% AUC 0.75 (King et al 2012). Scores of ? 21 differentiate those with and without postural response deficits with a sensitivity of 89%, specificity 81%    Modified CTSIB    1. Stand on firm surface with eyes open: 30/30 sec  2. Stand on firm surface with eyes closed: 30/30 sec  3. Stand on foam with eyes open: 30/30 sec  4. Stand on foam with eyes closed: 30/30 sec (significant increase in sway)    Initial score.  120  out of 120 sec (01/23/2016)  Most recent score:   out of 120 sec  ______________________________________________________________________________    03/12/2016    TREATMENT & EDUCATION:  Time in: 0815 Total Treatment time/minutes: 45    SUBJECTIVE:  He continues to do the projects every day.  He feels like his shoulders have been getting better.  He is meeting with his back pain MD next week.   Have you fallen since your last visit? No  Pain: 3-4/10 in B shoulders    1. Manual Therapy for 30 minutes.  -GH traction through his arm.   -Posterior GH glide with shoulder slightly abduction.   -Cross friction and pin and stretch to his B becs and upper traps  -Inferior GH glide with shoulder in  slight abduction.  Needed to start gradually as pt was having a lot of pain with this. Had [name] work on this as well.  Afterwards pt had full AROM without pain.      2. Neuromuscular Re-education for 15 minutes   -Clock balance exercise reaching in all directions. 3x on each side.   -SLS 20 seconds on each side.   -Heel toe walking   -Tandem stance progressing to eyes closed.   -Walking on his toes   -Walking on his heels   -Grapevine walking.     Home exercise instruction: verbal instructions and demonstrations were provided for the above exercises  a handout was provided describing the exercises in written and picture format  The patient was able to demonstrate the exercises after instructions.    -Hip flexor stretch  -Cervical side bend stretch    -Wall angles with shoulder retraction .   -Shoulder ER with red band.    -Shoulder Rows with a red band.   -Shoulder punches with a red band.   -GH inferior glide.   -Doorway pec stretch.   -Posterior pelvic tilt with heel slides   -Single leg bridges  -Lumbar rotation over the therapy ball.     -Narrow BOS with eyes closed with head turns on foam   -Tandem stance with eyes closed  -Balance on tip toes  -Heel toe walking  -Grapevine walking  -Clock balance exercises     ASSESSMENT OF TREATMENT PROVIDED TODAY: Pt responding well to manual therapy having significantly improved ROM in a pain free range afterwards.  Reinforced having [name] perform a inferior glide to continue improving his stretches.  Able to progress his balance exercise program as well. Continues to require skilled PT which will shift focus to higher level balance.     DISCHARGE GOALS  to be met by:  04/22/2016  Patient Goals: Improve his balance     Functional Discharge      Goals Short Term Goals Impairments Current Level of Function /   Progress Toward Goal   1. Pt will improve his MiniBEST test to 23/28 to demonstrate improved balance.     2. Pt will be report his pain in his shoulder on average  being a 2/10.     3. Pt will be able to sit with good posture without cueing. A. Pt will be independent in his HEP.     B. Pt will be able to perform dual task DGI with only a 10% time difference between each other. Weakness   Decreased sensation  Decreased balance 1. GOAL MET    2. Worked on his shoulder today with improvements in ROM. Average pain is 3-4/10    3. Needing cueing throughout session.     A.Continued to reinforce    B.Goal met      These goals were discussed and the patient agrees with them.    PLAN OF CARE:  This patient will be seen for 1 therapy sessions per week over a 90 day time frame.  The patient will perform the instructed home program 2-3 times per day.  The therapy will include therapeutic exercise, manual therapy, ultrasound, therapeutic activities, neuromuscular reeducation and gait training    Intervention for next treatment:   -BP and aerobic exercise   -Progress dynamic balance      Morley Kos PT, DPT  [facility] Rehabilitation Department   Physical Therapy   [phone] (Pager)     Referring Physician/Practitioner Certification Statement:  I certify the need for these services furnished under this plan of treatment and while under my care.      Signature:__________________________________Date:___________________

## 2016-03-19 NOTE — Progress Notes (Signed)
Speech Therapy Progress Note    The following patient identifiers were confirmed today: name and date of birth.    General Assessment  Date of Symptom Onset : 11/30/15  Start of Care Date: 01/23/16  Reason for Referral: cognitive communication impairment  Pain Score: 0 - No pain  Have you fallen in the past year?: Yes  Fall Screen Date: 01/23/16  Interpreter Status: Not Needed      Visits from start of care: 8    Referring Provider: Mikey Bussingavis, Arielle Patricia*    Treatment/Therapy diagnosis:   Encounter Diagnoses   Name Primary?   [name]. 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 Cognitive communication deficit          SUBJECTIVE:   Patient Statement: Ronnie Mason was on time to the appointment and participated well throughout the session.  He was accompanied by his wife, who was present for the entire session.  He had followed through with homework tasks and brought that with him today.  He did not have any new issues.                       TREATMENT & EDUCATION:   Time in: 11:00 am     Duration: 55 minutes     Interventions/Treatment provided:    Cognitive skills Tx    Memory:  Ronnie Mason reports that he is continuing to use his planner, but did not have it with him today.   Reviewed information that he needs to put in his planner, specifically that he has written out questions that he wants to ask for several upcoming medical appointments.  He agreed that he needs to do this and will continue to work on it.  Also reviewed notes that he had taken for homework and provided feedback.  He had taken notes on a TED talk that he listened to, added to the notes in a different color on listening to it a second time, and then organized and rewrote his notes that he was then able to use to effectively explain the information from the TED talk to this therapist.  He self-initiated taking notes at the end of the session.     Attention/planning/organizing:  Completed task to read through complex passage and  answer questions in which he needed to make inferences and draw conclusions.  He was able to choose the correct answer to complex multiple choice questions, but was unable to provide a rationale for why his response was the correct answer.  Also required moderate cues to describe the main point of the article and connect the ideas together.      ASSESSMENT OF TREATMENT: Ronnie Mason verbalized good understanding of strategies that were taught and practiced today for increased note taking as well as improved attention in complex reasoning.  He continues to benefit from use of strategies to focus on one item at a time and minimize external distractions.         DISCHARGE GOALS:    Goal #1: Pt will develop and utilize strategies for increased recall of daily information with minimal to no cues.  Time frame:  April 02, 2016  Progress toward goal:  Continued to develop and train in strategies for improved recall and follow through.    Goal #2: Pt will develop and utilize strategies for improved attention and problem solving with minimal cues in moderately complex reasoning tasks.  Time frame:  April 02, 2016  Progress toward goal:  Continued to develop and train in strategies for improved attention and problem solving.        Rehabilitation potential is: Good  Potential Barriers to achieve rehab goals: memory  These goals were discussed and the patient agrees with them.    Plan of care: Ronnie Mason will work on using his daily planner and to do lists for making a plan each day and then following that plan.  He will also continue to practice listening to 2-3 TED talks over the week and practice note taking. Encouraged him to increase the length of the talks that he is listening to up to 10-15 minutes.  He will return the therapy next week to continue POT.        Zenaida DeedJulie A Ashlon Lottman, MS, CCC-SLP, BC-ANCDS

## 2016-03-19 NOTE — Patient Instructions (Signed)
Balance Exercises:    *Perform this in the corner for safety.   -Feet together. Eyes closed. Standing on a foam cushion.  Progress this by adding in horizontal head turns.     -Standing on one leg. Goal is to get to 20-30 seconds.     -Standing on your tip toes.  Goal is to get to 10 seconds as high as you can.      -Heel toe stance on flat ground. Work on getting your eyes closed.     -Clock balance exercise.  Stand on one leg and use the other leg to reach in a clock wise direction.     Walking Balance Exercises :     -Grapevine walking: Over-out-behind-out. You should be working on your balance when you cross your feet. Do this along a counter where you can hold onto something.     -Heel toe walking along a line.

## 2016-03-25 ENCOUNTER — Ambulatory Visit (HOSPITAL_BASED_OUTPATIENT_CLINIC_OR_DEPARTMENT_OTHER): Payer: PPO | Attending: Anesthesiology | Admitting: Anesthesiology

## 2016-03-25 ENCOUNTER — Encounter (HOSPITAL_BASED_OUTPATIENT_CLINIC_OR_DEPARTMENT_OTHER): Payer: Self-pay | Admitting: Anesthesiology

## 2016-03-25 VITALS — BP 148/110 | HR 92 | Temp 97.5°F | Ht 74.35 in | Wt 230.2 lb

## 2016-03-25 DIAGNOSIS — G894 Chronic pain syndrome: Secondary | ICD-10-CM | POA: Insufficient documentation

## 2016-03-25 DIAGNOSIS — M7918 Myalgia, other site: Secondary | ICD-10-CM

## 2016-03-25 DIAGNOSIS — Z6829 Body mass index (BMI) 29.0-29.9, adult: Secondary | ICD-10-CM

## 2016-03-25 DIAGNOSIS — M791 Myalgia: Secondary | ICD-10-CM | POA: Insufficient documentation

## 2016-03-25 NOTE — Progress Notes (Signed)
[facility]  10/16/2015   Ronnie Mason  [id number]    REFERRED BY:   No referring provider defined for this encounter.  PCP:  Ronnie CarlsVara V Kraft, MD (General)  [phone] / [place] [phone]    REFERRED FOR: Evaluation of low back and neck pain  with the following specific issues to be addressed:   CHIEF COMPLAINT:   Chief Complaint   Patient presents with   . Pain Management     f/u RFA       HISTORY OF PRESENT ILLNESS:  Ronnie Mason is a 65 year old male with chronic pain located in lower back pain, specifically left L4 through S1.  Patient reports that the pain is worse in the morning, characterized as a tight muscle, dull and achy.  He has multiple other pain complaints today and presents with his wife.  He states that the RFA on the left side that he had with me July 2017 helped tremendously.  And he would like to continue to do the right side.  He also complains of bilateral lower extremity numbness, bilateral shoulder pain and bilateral hand cramping and pain.  He states that his primary care provider recently took him off of his hydrocodone secondary to alcohol abuse.  He and his wife both state that he has been abstinent from alcohol for several weeks now.  He complains that the medications that he has for pain are not as effective and that he has been eating NSAIDs and Tylenol by the handful despite having been warned by his primary care provider to not take more than prescribed.  His wife is concerned about this behavior.  Regarding his lower extremity numbness.  He had EMGs with neurology and is currently diagnosed with peripheral neuropathy and he is in physical therapy to address balance as she is secondary to this.  He denies having any lower extremity pain or radicular symptoms.  Regarding his hand tightness, he states that this has gotten worse since his become more active in his woodworking.  Both hands have tightness and stiffness particularly in  the morning and one hand does not worsen the other.  Regarding his shoulder pain he states his primary care provider obtained x-rays and he is currently in the process of physical therapy to see if things improve conservatively.  Ronnie Mason also has left sided neck pain, improved greatly after "injections" by Dr. [name], 2 years prior.  States that he has some mild numbness in his left trapezius.  Per chart review I believe he is referring to IMS.    Ronnie Mason endorses taking duloxetine 60 mg qD, melixcam, APAP, diclofenac transdermal gel, and lidocaine 5% patch.      Review of Systems:  1. Constitutional: Negative   2. Cardiovascular: Negative   3. Respiratory: Negative   4. MSK: As noted in HPI  5. Neuro: Denies lower extremity weakness      PAST HISTORY:  Past Medical History:   Diagnosis Date   . Carotid Sinus Hypersensitivity    . HYPERTENSION     . HYPOTENSION     . Intraparenchymal hemorrhage of brain (HCC) 2017   . Pain of right hip joint 01/23/2016   . 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 Syncope    . 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([facility])    . URI (upper respiratory infection)      Past Surgical History:  Procedure Laterality Date   . ANES; COLONOSCOPY  2002    repeat in 3 years   . ANES; COLONOSCOPY & POLYPECTOMY  11/18/2007    repeat in 3 years   . UNLISTED PROCEDURE FEMUR/KNEE     . UNLISTED PROCEDURE HANDS/FINGERS     . UNLISTED PROCEDURE SPINE  2013    rfa to c3 to c 7      Family History   Problem Relation Age of Onset   . Colon Cancer Father    . Heart (other) Mother      MI   . Other Family Hx Other      myelodysplasia     Social History     Social History Narrative    ** Merged History Encounter **            Allergies: Bee venom; Gabapentin; and Lidocaine    Physical Exam:  General:  NAD, seated comfortably in chair  Respiratory:  Speaking in full sentences.   Neuro: strength 5/5 in lower extremities in both proximal and distal muscle groups.  MSK:  Pain @  L4-S1 with loading and extension (facet loading significantly positive on left), Lumbar flexion and extension intact    Past medical, surgical, family, and social history as above was reviewed and updated personally with Mr. Vicencio.  Medications and allergies also reviewed and confirmed personally.    Assessment/Plan:  Ronnie Mason is a 65 year old male with chronic lower back pain, specifically at L4 through S1.  Pain is intermittent and is aggravated by abrupt turning, twisting, and jerking motions.  He successfully underwent an left sided L4,5, S1 RFA with good results, and would Like to pursue a right-sided RFA.    - Ask you PT about water exercises that you would be able to do at the pool  - Talk to your doctor about increasing duloxetine dose to 90 mg. If you do not see any effect after 8 weeks, then taper off.  - Do not take more meloxicam than prescribed. If it does not work, then do not take it  - Do not take more than 4g of tylenol per day, if it doesn't work, then don't take it  Ambulance person- Congratulations on your alcohol abstinence. However, given your balance issues and the fact that your hydrocodone was such a low dose, I do not think restarting it will significantly help you enough to justify the risks.  - We will schedule you for right sided lumbar MBB    - Discuss with your PCP initiating a muscle relaxant, such as tizanidine for your hands and low back pain

## 2016-03-25 NOTE — Patient Instructions (Addendum)
-   Ask you PT about water exercises that you would be able to do at the pool  - Talk to your doctor about increasing duloxetine dose to 90 mg. If you do not see any effect after 8 weeks, then taper off.  - Do not take more meloxicam than prescribed. If it does not work, then do not take it  - Do not take more than 4g of tylenol per day, if it doesn't work, then don't take it  Ambulance person- Congratulations on your alcohol abstinence. However, given your balance issues and the fact that your hydrocodone was such a low dose, I do not think restarting it will significantly help you enough to justify the risks.  - We will schedule you for right sided lumbar MBB    - Discuss with your PCP initiating a muscle relaxer, such as tizanidine

## 2016-03-26 ENCOUNTER — Ambulatory Visit (HOSPITAL_BASED_OUTPATIENT_CLINIC_OR_DEPARTMENT_OTHER): Payer: PPO | Admitting: Rehabilitative and Restorative Service Providers"

## 2016-03-26 ENCOUNTER — Ambulatory Visit (HOSPITAL_BASED_OUTPATIENT_CLINIC_OR_DEPARTMENT_OTHER): Payer: PPO | Admitting: Speech-Language Pathologist

## 2016-03-26 DIAGNOSIS — M25512 Pain in left shoulder: Secondary | ICD-10-CM

## 2016-03-26 DIAGNOSIS — I619 Nontraumatic intracerebral hemorrhage, unspecified: Secondary | ICD-10-CM

## 2016-03-26 DIAGNOSIS — R41841 Cognitive communication deficit: Secondary | ICD-10-CM

## 2016-03-26 DIAGNOSIS — G8929 Other chronic pain: Secondary | ICD-10-CM

## 2016-03-26 DIAGNOSIS — R2689 Other abnormalities of gait and mobility: Secondary | ICD-10-CM

## 2016-03-26 DIAGNOSIS — Z8679 Personal history of other diseases of the circulatory system: Secondary | ICD-10-CM

## 2016-03-26 DIAGNOSIS — M545 Low back pain, unspecified: Secondary | ICD-10-CM

## 2016-03-26 DIAGNOSIS — R262 Difficulty in walking, not elsewhere classified: Secondary | ICD-10-CM

## 2016-03-26 NOTE — Progress Notes (Signed)
Physical Therapy Progress Note  This note serves as a CMS evaluation form that requires the referring provider to certify the need for therapy services furnished under this Plan of Treatment. The signing provider is certifying the plan of care.    The following patient identifiers were confirmed today: name and date of birth.    Certification From: 16/10/96  Certification To: 04/54/09    Date of Symptom Onset : 11/30/15  Start of Care Date: 01/23/16    Visits from Start of Episode: 9    Referring Provider: Royce Macadamia*    NPI: [id number]  Reason for Referral: 65 year old male with hypertensive L thalamic ICH.   Treatment/Therapy diagnosis:   Encounter Diagnoses   Name Primary?   . Difficulty in walking, not elsewhere classified Yes   . Chronic pain of both shoulders    . Back pain, lumbosacral    . Intraparenchymal hemorrhage of brain ([place])    . Balance problem      Precautions: None          PERTINENT MEDICAL/SURGICAL HISTORY:   Past Medical History:   Diagnosis Date   . Carotid Sinus Hypersensitivity    . HYPERTENSION     . HYPOTENSION     . Intraparenchymal hemorrhage of brain ([place]) 2017   . Pain of right hip joint 01/23/2016   . 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 Syncope    . 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([place])    . URI (upper respiratory infection)       X-ray findings: 02/19/2016  RIGHT SIDE: Moderate glenohumeral joint osteoarthropathy. Mild elevation of   the humeral head. Prominent spur the acromion nearly contacts the humeral   head. AC joint mild proliferation. Coracoid unremarkable. No fracture.   Probable osteopenia accounting for subtle lucencies in the proximal humerus.    LEFT SIDE: Mild lateral humeral joint osteoarthropathy.    Spur the acromion. AC joint mild proliferation. Coracoid unremarkable. No   fracture. No loose body.    Past Surgical History:   Procedure Laterality Date   . ANES; COLONOSCOPY  2002    repeat in 3 years   . ANES;  COLONOSCOPY & POLYPECTOMY  11/18/2007    repeat in 3 years   . UNLISTED PROCEDURE FEMUR/KNEE     . UNLISTED PROCEDURE HANDS/FINGERS     . UNLISTED PROCEDURE SPINE  2013    rfa to c3 to c 7      OBJECTIVE MEASURES  -Attention/Cognition/Speech: Difficulty with short term memory which has improved since going home.   -Posture: forward head, rounded shoulders, R shoulder elevated compared to L,falttening in his lumbar spine.   -Sensation: Intact to light touch per report except numbness in the lateral calf to the bottom of the feet  -Tone assessment: (Measured via MAS): 0/4 in B hamstrings, quads, and calves    LE Strength: 01/23/2016    L LE (_/5) R LE (_/5)   Hip Flexion 4 4- (gives away due to hip pain)   Hip Ext 4 4   Hip Abduction 4 4   Knee Extension 5 5   Knee Flexion 5 5   Ankle Dorsiflexion 4 4     UE Strength: 01/23/2016    L UE (_/5) R UE (_/5)   Shoulder Abduction 4- (c/o shoulder pain) 4- (c/o shoulder pain)   Shoulder Flexion 4 4   Shoulder IR 5 5   Shoulder ER 4 4   Elbow  Flexion 5 5   Elbow Extension 5 5     Range of Motion:  -Slight tightness in hamstring and hip flexors  -Otherwise WFL    -AROM in degrees: 01/30/2016   R shoulder flexion: 105   L shoulder flexion: 75   R shoulder abduction: 110   L shoulder abduction: 75    -AROM in degrees: 03/05/2016   R shoulder flexion: 135   L shoulder flexion: 110   R shoulder abduction: 130   L shoulder abduction: 85    -PROM in degrees: 01/30/2016   R shoulder flexion: 120    L shoulder flexion: 130   R shoulder abduction: 115   L shoulder abduction: 75     -Ober's test positive on R    -Functional Mobility:    -Bed mobility: Independent with increased time due to back and shoulder pain    -Transfers: Independent     -Gait: good foot clearance, narrow BOS, decreased arm swing on the R compared to the L     -Balance:   Mini Balance Evaluation Systems Test (Mini BEST test)    1. Sit to stand: 2  2. Rise to toes: 1  3. Stand on one leg: 1  Left: 1  Right:  1  4. Compensatory stepping correction - forward: 2  5. Compensatory stepping correction - backward:2  6. Compensatory stepping correction - lateral: 2  Left: 2  Right: 2  7. Stance (feet together); eyes open, firm surface: 2  8. Stance (feet together); eyes closed, foam surface: 2  9. Incline - Eyes closed: 2  10.  Change in gait speed: 2  11.  Walk with head turns - horizontal: 1  12.  Walk with pivot turns: 2  13.  Step over obstacles: 2  14.  Timed up and go with dual task: 1  TUG: 08.59  TUG with dual task: 10.35    Initial Score:  19/28 (01/23/2016)  Most recent score:  23/28 (03/12/2016)    Score interpretation:   Chronic Stroke - Francie Massing et al 2013): Scores of ? 17.5 identified those with a history of falling, sensitivity 64%, specificity 64% AUC 0.64  Parkinson's Disease: - Scores of ? 19 identified recurrent fallers; sensitivity 79%, specificity 67% AUC 0.75 (King et al 2012). Scores of ? 21 differentiate those with and without postural response deficits with a sensitivity of 89%, specificity 81%    Modified CTSIB    1. Stand on firm surface with eyes open: 30/30 sec  2. Stand on firm surface with eyes closed: 30/30 sec  3. Stand on foam with eyes open: 30/30 sec  4. Stand on foam with eyes closed: 30/30 sec (significant increase in sway)    Initial score.  120  out of 120 sec (01/23/2016)  Most recent score:   out of 120 sec  ______________________________________________________________________________    03/26/2016    TREATMENT & EDUCATION:  Time in: 0815 Total Treatment time/minutes: 45    SUBJECTIVE:  Pt has been seeing the MD about his back.  He still needs to get the other side of his back done.  Pt's shoulder really bothered him after he fell asleep on his side.   Have you fallen since your last visit? No  Pain: 2-3/10 in B shoulders    1. Therapeutic Exercise for 10 minutes.   -Scapular squeezes with green tubing. 1x10  -Shoulder rows with green tubing. 1x10.   -Shoulder extension with scapular squeeze  with green tubing.  1x10.     2. Neuromuscular Re-education for 30 minutes   -Clock balance exercise reaching in all directions. 3x on each side.   -SLS 15-20 seconds on each side.   -Balance on wobble board. Having significant difficulty.  Only able to maintain for a couple of seconds.   -Static stance on upside down BOSU.  Progressed to perturbations.  -Balance on his tip toes.   -Walking with horizontal head turns.    -Walking with vertical head turns.   -Walking with eyes closed. Pt tends to veer towards the right.  Showed significant improvement with practice.        Home exercise instruction: verbal instructions and demonstrations were provided for the above exercises  a handout was provided describing the exercises in written and picture format  The patient was able to demonstrate the exercises after instructions.    -Hip flexor stretch  -Cervical side bend stretch    -Wall angles with shoulder retraction .   -Shoulder ER with red band.    -Shoulder Rows with a red band.   -Shoulder punches with a red band.   -GH inferior glide.   -Doorway pec stretch.   -Posterior pelvic tilt with heel slides   -Single leg bridges  -Lumbar rotation over the therapy ball.     -Narrow BOS with eyes closed with head turns on foam   -Tandem stance with eyes closed  -Balance on tip toes  -Heel toe walking  -G[place]vine walking  -Clock balance exercises     ASSESSMENT OF TREATMENT PROVIDED TODAY: Pt continues to have difficulty due to his shoulders.  Overall he has shown improvement in his ROM.  Able to review strengthening exercises and perform dynamic walking exercises. Able to progress his balance exercises for home. Continues to require skilled PT which will shift focus to higher level balance.     DISCHARGE GOALS  to be met by:  04/22/2016  Patient Goals: Improve his balance     Functional Discharge      Goals Short Term Goals Impairments Current Level of Function /   Progress Toward Goal   1. Pt will improve his MiniBEST test  to 23/28 to demonstrate improved balance.     2. Pt will be report his pain in his shoulder on average being a 2/10.     3. Pt will be able to sit with good posture without cueing. A. Pt will be independent in his HEP.     B. Pt will be able to perform dual task DGI with only a 10% time difference between each other. Weakness   Decreased sensation  Decreased balance 1. GOAL MET    2. Worked on his shoulder today with improvements in ROM. Average pain is 2-3/10    3. Needing cueing throughout session.     A.Continued to reinforce    B.Goal met      These goals were discussed and the patient agrees with them.    PLAN OF CARE:  This patient will be seen for 1 therapy sessions per week over a 90 day time frame.  The patient will perform the instructed home program 2-3 times per day.  The therapy will include therapeutic exercise, manual therapy, ultrasound, therapeutic activities, neuromuscular reeducation and gait training    Intervention for next treatment:   -BP and aerobic exercise   -Progress dynamic balance-balance beam, multitasking   -Retest outcome measures.     Morley Kos PT, DPT  [facility] Rehabilitation Department  Physical Therapy   [phone] (Voicemail)  [phone] (Pager)     Referring Physician/Practitioner Certification Statement:   I certify the need for these services furnished under this plan of treatment and while under my care.      Signature:__________________________________Date:___________________

## 2016-03-26 NOTE — Progress Notes (Signed)
Speech Therapy Progress Note    The following patient identifiers were confirmed today: name and date of birth.    General Assessment  Date of Symptom Onset : 11/30/15  Start of Care Date: 01/23/16  Reason for Referral: cognitive communication impairment  Pain Score: 0 - No pain  Have you fallen in the past year?: Yes  Fall Screen Date: 01/23/16  Interpreter Status: Not Needed      Visits from start of care: 9    Referring Provider: Mikey Bussingavis, Arielle Patricia*    Treatment/Therapy diagnosis:   Encounter Diagnoses   Name Primary?   [name]. 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 Cognitive communication deficit          SUBJECTIVE:   Patient Statement: Ronnie Mason was on time to the appointment and participated well throughout the session.  He was accompanied by his wife, who was present for the entire session.  He had followed through with homework tasks and brought that with him today.  He did not have any new issues.                       TREATMENT & EDUCATION:   Time in: 11:00 am     Duration: 60 minutes     Interventions/Treatment provided:    Cognitive skills Tx    Memory:  Ronnie Mason reports that he did follow through with writing out questions for his medical appointment earlier this week.  He now needs to take the information to his primary care provider.  Encouraged him to take time to review the information and write out any additional questions prior to this appointment.   He had taken notes on 2 different TED talks that he listened to.  He had taken notes, reviewed and printed information from the TED talk links, and then reorganized.  He used his notes to provide a summary of the information that he had learned.  He noted that this talk was more difficult for him to understand and that he listened to it 3-4 times.  Therapist needed to ask questions to comprehend links between ideas.  Ronnie Mason was then able to recognize that his notes did not contain enough information that he had  synthesized but had more verbatim details that were hard to link to main idea.  Provided feedback on strategies to improve comprehension and note taking for later recall and discussion.       Attention/planning/organizing:  Completed task to create a hypothetical work and class schedule based upon given parameters.  He required minimal cues to fully comprehend the task, but was able to ask appropriate questions to increase comprehension.  He took notes, but did not initially use tools suggested by therapist to better manage information.  He was then given feedback on errors that he had made in scheduling and provided instruction to utilize more effective strategies.  He then required moderate cues to successfully use these strategies as he continued to rush through task with decreased attention to detail and impulsivity in overall decision making.        ASSESSMENT OF TREATMENT: Ronnie Mason verbalized good understanding of strategies that were taught and practiced today for increased note taking as well as improved attention in complex reasoning.  However, he did not initially utilize strategies in given task and then required moderate cues to work systematically through the task using given strategies.  He demonstrated difficulty with attention to detail and impulsivity in decision  making in this task today.  He continues to benefit from use of strategies to focus on one item at a time and minimize external distractions.         DISCHARGE GOALS:    Goal #1: Pt will develop and utilize strategies for increased recall of daily information with minimal to no cues.  Time frame:  April 02, 2016  Progress toward goal:  Continued to develop and train in strategies for improved recall and follow through.    Goal #2: Pt will develop and utilize strategies for improved attention and problem solving with minimal cues in moderately complex reasoning tasks.  Time frame:  April 02, 2016  Progress toward goal:  Continued to develop  and train in strategies for improved attention and problem solving.        Rehabilitation potential is: Good  Potential Barriers to achieve rehab goals: memory  These goals were discussed and the patient agrees with them.    Plan of care: Ronnie Mason will work on using his daily planner and to do lists for making a plan each day and then following that plan.  He will also continue to practice listening to 2-3 TED talks over the week and practice note taking. Encouraged him to increase the length of the talks that he is listening to up to 10-15 minutes.  He will return the therapy next week to continue POT.        Zenaida DeedJulie A Jamekia Gannett, MS, CCC-SLP, BC-ANCDS

## 2016-03-26 NOTE — Patient Instructions (Signed)
*  Perform this in the corner for safety.     -Feet together. Eyes closed. Standing on a foam cushion. Progress this by adding in horizontal head turns.     -Standing on one leg. Goal is to get to 20-30 seconds.     -Standing on your tip toes. Goal is to get to 10 seconds as high as you can.     -Heel toe stance on flat ground. Work on getting your eyes closed.       -Clock balance exercise.  Stand on one leg and use the other leg to reach in a clock wise direction. Try reaching your leg further.     Walking Balance Exercises :     -Grapevine walking: Over-out-behind-out. You should be working on your balance when you cross your feet. Do this along a counter where you can hold onto something.     -Heel toe walking along a line.

## 2016-03-27 ENCOUNTER — Encounter (INDEPENDENT_AMBULATORY_CARE_PROVIDER_SITE_OTHER): Payer: Self-pay | Admitting: Plastic and Reconstructive Surgery

## 2016-03-30 ENCOUNTER — Encounter (INDEPENDENT_AMBULATORY_CARE_PROVIDER_SITE_OTHER): Payer: Self-pay | Admitting: Internal Medicine

## 2016-03-30 ENCOUNTER — Ambulatory Visit (INDEPENDENT_AMBULATORY_CARE_PROVIDER_SITE_OTHER): Payer: PPO | Admitting: Internal Medicine

## 2016-03-30 VITALS — BP 130/89 | HR 81 | Ht 74.35 in | Wt 224.4 lb

## 2016-03-30 DIAGNOSIS — G8929 Other chronic pain: Secondary | ICD-10-CM

## 2016-03-30 DIAGNOSIS — G894 Chronic pain syndrome: Secondary | ICD-10-CM

## 2016-03-30 DIAGNOSIS — R635 Abnormal weight gain: Secondary | ICD-10-CM

## 2016-03-30 DIAGNOSIS — M25512 Pain in left shoulder: Secondary | ICD-10-CM

## 2016-03-30 DIAGNOSIS — M545 Low back pain, unspecified: Secondary | ICD-10-CM

## 2016-03-30 DIAGNOSIS — I10 Essential (primary) hypertension: Secondary | ICD-10-CM

## 2016-03-30 DIAGNOSIS — F1021 Alcohol dependence, in remission: Secondary | ICD-10-CM

## 2016-03-30 DIAGNOSIS — M542 Cervicalgia: Secondary | ICD-10-CM

## 2016-03-30 DIAGNOSIS — I619 Nontraumatic intracerebral hemorrhage, unspecified: Secondary | ICD-10-CM

## 2016-03-30 DIAGNOSIS — Z6828 Body mass index (BMI) 28.0-28.9, adult: Secondary | ICD-10-CM

## 2016-03-30 DIAGNOSIS — M25511 Pain in right shoulder: Secondary | ICD-10-CM

## 2016-03-30 MED ORDER — DULOXETINE HCL 60 MG OR CPEP
60.0000 mg | DELAYED_RELEASE_CAPSULE | Freq: Every day | ORAL | 1 refills | Status: DC
Start: 2016-03-30 — End: 2016-08-24

## 2016-03-30 MED ORDER — DULOXETINE HCL 30 MG OR CPEP
30.0000 mg | DELAYED_RELEASE_CAPSULE | Freq: Every day | ORAL | 1 refills | Status: DC
Start: 2016-03-30 — End: 2016-09-07

## 2016-03-30 NOTE — Progress Notes (Signed)
Ronnie Mason is a 65 year old male     Review of patient's allergies indicates:  Allergies   Allergen Reactions   . Bee Venom Hives, Itching and Swelling   . Gabapentin      Flu like symptoms, diarrhea, body ache upset stomach   . Lidocaine Rash     Lidocaine patch. PATIENT STATES HE IS ALLERGIC TO THE ADHESIVE NOT THE PATCH.       BP 130/89   Pulse 81   Ht 6' 2.35" (1.888 m)   Wt (!) 224 lb 6.4 oz (101.8 kg)   SpO2 98%   BMI 28.54 kg/m     Chief Complaint   Patient presents with   . Follow-Up      on medication   . Hand Problem     both hands       HPI  History of:    pcp [name], MD   HM needed :      He is also followed by neurology Dr. 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 Valetta Mole -Patient should also be following up with Dr. 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 Elder Cyphers at [facility] 2 January 22, 2016-Nerve block of the facet joint/medial branch blocks targeted to L3-L4, L4-L5, and L5-S1 facet joints (left) for chronic low back pain suspected to be facet origin-   Chronic pain        01/02/2016 - vitamin D was very low at 17 suspect contributing to achiness     Last visit w/ me- 02/17/2016    Pending visit w Dr Chrissie Noa , neurosurgery     Today follow-up above issues:     Good over , wants to drive    Dr Amada Jupiter last week - can inc dulexetine    Quit meloxicam 2 days ago , was taking 3 pills/ was also taking aleve   Tylenol - now none - has stopped it    Alcohol none -    Seeing  ot for pain    Sleep on hand - pain in hand in muscle between when wake    Pending injection spine    Both shoulder bone spur - rom pain - - they hurt more post pt and doing stretching / has tens but has not tried on shoulder, tens does not stick to back    Weight gain - inactivity and junk food per wife, not walking and barely exercise dog as dog old and only walk down block    She /he also wonders about hydrodrangia root for bone spurs ?     Has mri pending w Dr Mayford Knife     Office Visit on 01/02/16   1. CBC, DIFF   Result Value Ref Range    WBC 6.83 4.3 - 10.0 10*3/uL    RBC 4.28 (L) 4.40 - 5.60 10*6/uL    Hemoglobin 14.2 13.0 - 18.0 g/dL    Hematocrit 43 38 - 50 %    MCV 101 (H) 81 - 98 fL    MCH 33.2 27.3 -  33.6 pg    MCHC 32.9 32.2 - 36.5 g/dL    Platelet Count 161 096 - 400 10*3/uL    RDW-CV 12.0 11.6 - 14.4 %    % Neutrophils 54 %    % Lymphocytes 31 %    % Monocytes 11 %    % Eosinophils 3 %    % Basophils 1 %    % Immature Granulocytes 0 %    Neutrophils 3.68 1.80 - 7.00 10*3/uL    Absolute Lymphocyte Count 2.10 1.00 - 4.80 10*3/uL    Monocytes 0.76 0.00 - 0.80 10*3/uL    Absolute Eosinophil Count 0.22 0.00 - 0.50 10*3/uL    Basophils 0.05 0.00 - 0.20 10*3/uL    Immature Granulocytes 0.02 0.00 - 0.05 10*3/uL    Nucleated RBC 0.00 0.00 10*3/uL    % Nucleated RBC 0 %   2. COMPREHENSIVE METABOLIC PANEL   Result Value Ref Range    Sodium 138 135 - 145 meq/L    Potassium 4.5 3.6 - 5.2 meq/L    Chloride 104 98 - 108 meq/L    Carbon Dioxide, Total 25 22 - 32 meq/L    Anion Gap 9 4 - 12    Glucose 99 62 - 125 mg/dL    Urea Nitrogen 22 (H) 8 - 21 mg/dL    Creatinine 0.45 4.09 - 1.18 mg/dL    Protein (Total) 7.0 6.0 - 8.2 g/dL    Albumin 4.3 3.5 - 5.2 g/dL    Bilirubin (Total) 0.3 0.2 - 1.3 mg/dL    Calcium 9.4 8.9 - 81.1 mg/dL    AST (GOT) 19 9 - 38 U/L    Alkaline Phosphatase (Total) 81 37 - 159 U/L    ALT (GPT) 21 10 - 48 U/L    GFR, Calc, European American >60 mL/min/[1.73_m2]    GFR, Calc, African American  >60 mL/min/[1.73_m2]    GFR, Information       Calculated GFR in mL/min/1.73 m2 by MDRD equation.  Inaccurate with changing renal function.  See http://depts.[email or web address].it.html   3. VITAMIN D (25 HYDROXY)   Result Value Ref Range    Vitamin D2 (25_Hydroxy) 1.1 ng/mL    Vitamin D3 (25_Hydroxy) 16.4 ng/mL    Vit D (25_Hydroxy) Total 17.5 (L) 20.1 - 50.0 ng/mL    Vit D (25_Hydroxy) Interp Deficient:         8.0-20.0 ng/mL    4. SED RATE   Result Value Ref Range    Erythrocyte Sedimentation Rate 15 0 - 15 mm/h   5. CRP, HIGH SENSITIVITY   Result Value Ref Range    CRP, High Sensitivity 1.8 0.0 - 10.0 mg/L   6. CREATINE KINASE TOTAL ACTIVITY   Result Value Ref Range    Creatinine Kinase Total Activity 118 62 - 325 U/L   7. THYROID STIMULATING HORMONE   Result Value Ref Range    Thyroid Stimulating Hormone 1.127 0.400 - 5.000 u[IU]/mL   8. T4, FREE   Result Value Ref Range    Thyroxine (Free) 0.8 0.6 - 1.2 ng/dL         MEDICATION PRIOR TO VISIT  Reports   Outpatient Medications Prior to Visit   Medication Sig Dispense Refill   . Acetaminophen 500 MG Oral Tab 2 tablets bid     . AmLODIPine Besylate 2.5 MG Oral Tab 1 tablet bid dose increase 01/30/2016 180 tablet 3   . Atorvastatin Calcium 10 MG Oral Tab Take 1 tablet (10 mg) by  mouth daily. 90 tablet 1   . Cholecalciferol (VITAMIN D3) 2000 units Oral Cap Take 1 capsule (2,000 Units) by mouth daily. For low level 1 capsule 1   . Clobetasol Propionate 0.05 % External Ointment Small amount to rash as needed bid for 1-2 weeks at a time 15 g 1   . Cyanocobalamin 1000 MCG Oral Tab one per day over-the-counter started 5/21 /2012 this level borderline low 1 Tab 1   . Diclofenac Sodium (VOLTAREN) 1 % Transdermal Gel Apply 4 g topically 4 times a day. Apply to neck, trapezius muscle, and low back. No more than 4 g applied at a time. 1 Tube 5   . DULoxetine HCl 60 MG Oral CAPSULE ENTERIC COATED PARTICLES Take 1 capsule (60 mg) by mouth daily. 90 capsule 1   .  EPINEPHrine 0.3 MG/0.3ML Injection Solution Auto-injector Inject as instructed per patient package insert, 0.3 mg intramuscularly or subcutaneously into the thigh, if needed to treat anaphylaxis 3 each 1   . Glucosamine-Chondroitin (GLUCOSAMINE CHONDR COMPLEX OR)      . Lidocaine 5 % External Patch Apply 1 patch onto the skin daily. Apply to painful area for up to 12 hours in a 24 hour period. 30 patch 10   . Lisinopril 20 MG Oral Tab Take 1 tablet (20 mg) by mouth every 12 hours. 180 tablet 1   . Meloxicam 7.5 MG Oral Tab 1-2 po per day as needed (Patient not taking: Reported on 03/30/2016) 60 tablet 1   . Primidone 50 MG Oral Tab 1 po bid 180 tablet 0   . THIAMINE HCL OR      . TURMERIC OR        No facility-administered medications prior to visit.          REVIEW OF SYSTEMS - are otherwise negative unless as noted in HPI above or as additional issues below    Constitutional:   per wife overall better   He said fine   Eyes:    Head ears, nose mouth throat:     Cardiovascular:   Respiratory:     Gastrointestinal:   Genitourinary:      Musculoskeletal:  Doing ok hand pain in am , ot has discussed splint    Integumentary:     Neurological:   Psychiatric/Behavioral:       Endocrine:     Hematological:   Allergic/Immunologic:        PHYSICAL EXAM  BP 130/89   Pulse 81   Ht 6' 2.35" (1.888 m)   Wt (!) 224 lb 6.4 oz (101.8 kg)   SpO2 98%   BMI 28.54 kg/m     Well Dressed     Constitution, In general looks:   well , good coloring  No diaphoresis  Patient is Alert  & Attentive  Patient is conversive    Psychiatric Mental Status / Neurologic Mental Status:  Affect:  Pleasant   Calm  Mood:  Good      Eyes: no discharge, no gross puffiness, non- icteric      Respiratory:   Not tachyphpnic  Clear to ascultation  No rales, rhonchi , without increased effort or stridor  No clubbing     Abdomen / Gastrointestinal:  Soft. No masses. No guarding. No rebound  No gross bulge or hernia. Abdomen, groin  No bruit or pulsatile  mass    Genitourinary:  No suprapubic tenderness  No increased flank pain with percussion    Musculoskeletal:  No  gross abnormal movement, twitch   No gross atrophy or deformity  rom shoulder intact except w certain movement pain     Skin:  No gross lesion or rash    Neurologic:   Grossly intact  Memory:   Attention       Cranial Nerve    Eyes pupil and lid are intact   II, III, IV, VIII   Facial Movement VII: grossly intact, no facial droop     Motor- Gait-coordination: get up and go normal, no gross weakness   Gait- base normal appropriate for age   Get up and go -no problem        Hematologic/Lymphatic /Immunologic:  No gross bruising or rash or hives       IMPRESSION / PLAN / DISCUSSION     Adel was seen today for follow-up  and hand problem.    Diagnoses and all orders for this visit:    Chronic pain of both shoulders    Chronic bilateral low back pain without sciatica  -     DULoxetine HCl 60 MG Oral CAPSULE ENTERIC COATED PARTICLES; Take 1 capsule (60 mg) by mouth daily.  -     DULoxetine HCl 30 MG Oral CAPSULE ENTERIC COATED PARTICLES; Take 1 capsule (30 mg) by mouth daily.    Chronic pain syndrome  -     DULoxetine HCl 60 MG Oral CAPSULE ENTERIC COATED PARTICLES; Take 1 capsule (60 mg) by mouth daily.  -     DULoxetine HCl 30 MG Oral CAPSULE ENTERIC COATED PARTICLES; Take 1 capsule (30 mg) by mouth daily.    Cervicalgia  -     DULoxetine HCl 60 MG Oral CAPSULE ENTERIC COATED PARTICLES; Take 1 capsule (60 mg) by mouth daily.  -     DULoxetine HCl 30 MG Oral CAPSULE ENTERIC COATED PARTICLES; Take 1 capsule (30 mg) by mouth daily.    History of alcohol dependence (HCC)    Intraparenchymal hemorrhage of brain (HCC)    Weight gain    Essential hypertension      Chronic neck back shoulder pain   Increased the duloxetine to 90 mg  Cut the meloxicam as he is not using and it did not help  Watch and dec med if dizziness    bp is fine    Seeing ot - recommend getting the hand splint - to leg me know if another  referral needed   He sleeps w hand shut and wake up w pain and also end to sleep on hand    I do not want to add muscle relaxant   Already a fall risk    Cut the junk food and if wt continue to recheck thyroid  Need more activity    bp ok ,no changes     On the driving , at 6 mo will need to consider ot driving assessment if he really wants to drive and if neuro has no major advice against    He need to be better in charge of med and not to self medicate or escalate dos       MEDICATION as of Discharge  Current Outpatient Prescriptions   Medication Sig Dispense Refill   . Acetaminophen 500 MG Oral Tab 2 tablets bid     . AmLODIPine Besylate 2.5 MG Oral Tab 1 tablet bid dose increase 01/30/2016 180 tablet 3   . Atorvastatin Calcium 10 MG Oral Tab Take 1 tablet (10 mg) by mouth daily. 90 tablet  1   . Cholecalciferol (VITAMIN D3) 2000 units Oral Cap Take 1 capsule (2,000 Units) by mouth daily. For low level 1 capsule 1   . Clobetasol Propionate 0.05 % External Ointment Small amount to rash as needed bid for 1-2 weeks at a time 15 g 1   . Cyanocobalamin 1000 MCG Oral Tab one per day over-the-counter started 5/21 /2012 this level borderline low 1 Tab 1   . Diclofenac Sodium (VOLTAREN) 1 % Transdermal Gel Apply 4 g topically 4 times a day. Apply to neck, trapezius muscle, and low back. No more than 4 g applied at a time. 1 Tube 5   . DULoxetine HCl 30 MG Oral CAPSULE ENTERIC COATED PARTICLES Take 1 capsule (30 mg) by mouth daily. 90 capsule 1   . DULoxetine HCl 60 MG Oral CAPSULE ENTERIC COATED PARTICLES Take 1 capsule (60 mg) by mouth daily. 90 capsule 1   . EPINEPHrine 0.3 MG/0.3ML Injection Solution Auto-injector Inject as instructed per patient package insert, 0.3 mg intramuscularly or subcutaneously into the thigh, if needed to treat anaphylaxis 3 each 1   . Glucosamine-Chondroitin (GLUCOSAMINE CHONDR COMPLEX OR)      . Lidocaine 5 % External Patch Apply 1 patch onto the skin daily. Apply to painful area for up to 12  hours in a 24 hour period. 30 patch 10   . Lisinopril 20 MG Oral Tab Take 1 tablet (20 mg) by mouth every 12 hours. 180 tablet 1   . Primidone 50 MG Oral Tab 1 po bid 180 tablet 0   . THIAMINE HCL OR      . TURMERIC OR        No current facility-administered medications for this visit.          Risk of taking medication properly and follow up discussed especially to call if problem    Patient   Family Member -   express understanding of care plan/medications    I spent a total time of 25 minutes face-to-face with the patient, of which more than 50% was spent counseling and coordinating care as outlined in this note.    No Follow-up on file.     Cc to following MD:        ++++++++++++++++++++++++++++++++++++++++++++++++++    Patient Active Problem List    Diagnosis Date Noted   . Difficulty in walking, not elsewhere classified [R26.2] 03/12/2016   . Vitamin D deficiency [E55.9] 02/13/2016   . Balance problem [R26.89] 01/23/2016   . Chronic pain of both shoulders [M25.512, G89.29, M25.511] 01/23/2016     X ray arthritis  02/19/2016       . Pain of right hip joint [M25.551] 01/23/2016   . 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 Essential hypertension [I10] 12/16/2015   . Essential tremor [G25.0] 12/16/2015   . 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 Alcohol abuse [F10.10] 12/16/2015   . 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 History of alcohol dependence (HCC) [F10.21] 06/26/2015   . Demyelinating changes  in brain (HCC) [G37.9] 05/07/2015   .  Idiopathic peripheral neuropathy [G60.9] 03/15/2015   . 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 Tremor [R25.1] 04/20/2013   . Lumbosacral radiculopathy at S1 [M54.17] 03/22/2013     Left     . Fall [W19.XXXA] 02/23/2013     BP < 100  Occasioanal tri[p and leg weakness     . Back pain, lumbosacral [M54.5, M54.89] 02/15/2013   . Lumbar radiculopathy, chronic [M54.16] 02/15/2013   . Neck pain [M54.2] 02/15/2013   . Chronic pain [G89.29] 12/21/2012     [facility] at [facility], DO, [place] Gladebecca Capen    Neck pain -burn c3-7 on left side per patient  Some trigger injection    Lumbar pain-since 80's better with exercise     . 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 Numbness and tingling of leg [R20.2] 12/21/2012     Left calf -left foot long term suspect from back-suspect L 45     . Chronic back pain [M54.9, G89.29] 12/21/2012     Mri in mid scape 1/214  Multilevel degenerative disc disease of the lumbar spine. Circumferential disc bulge at all levels below and including L1-2. No significant central spinal canal stenosis or neuroforaminal narrowing.  2. Mild retrolisthesis of L5 on S1.           . B12 deficiency [E53.8] 12/21/2012     Borderline low 5 /21/2014     . Chronic renal insufficiency, stage III (moderate) [N18.3] 12/21/2012     5/ 2013     . Hyperlipidemia [E78.5] 09/26/2012   . Cervicalgia [M54.2] 03/11/2010     Radiofrequency ablation of c3 to c 7     . Syncope [R55]    . Hypotension [I95.9]    . 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 URI (upper respiratory infection) [J06.9]          Past Medical History:   Diagnosis Date   . Carotid Sinus Hypersensitivity    . HYPERTENSION     . HYPOTENSION     . Intraparenchymal hemorrhage of brain (HCC) 2017   . Pain of right hip joint 01/23/2016   . 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  Syncope    . 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(HCC)    . URI (upper respiratory infection)        Social History     Social History   . Marital status: Married     Spouse name: N/A   . Number of children: N/A   . Years of education: N/A     Occupational History   . Not on file.     Social History Main Topics   . Smoking status: Never Smoker   . Smokeless tobacco: Not on file   . Alcohol use Yes   . Drug use: No   . Sexual activity: Not on file     Other Topics Concern   . Not on file     Social History Narrative    ** Merged History Encounter **              Family History   Problem Relation Age of Onset   . Colon Cancer Father    . Heart (other) Mother      MI   . Other Family Hx Other      myelodysplasia  Social History     Social History   . Marital status: Married     Spouse name: N/A   . Number of children: N/A   . Years of education: N/A     Social History Main Topics   . Smoking status: Never Smoker   . Smokeless tobacco: Not on file   . Alcohol use Yes   . Drug use: No   . Sexual activity: Not on file     Other Topics Concern   . Not on file     Social History Narrative    ** Merged History Encounter **            Health Maintenance   Topic Date Due   . Influenza Vaccine (1) 05/03/2016   . Diabetes Screen  01/02/2019   . Cholesterol Test  09/11/2020   . Colonoscopy  09/24/2020   . Tetanus Vaccine  12/22/2022   . Zoster Vaccine  Completed   . Hepatitis C Screen  Addressed   . HIV Screen  Addressed

## 2016-03-30 NOTE — Patient Instructions (Addendum)
Plan wt loss  Pool activities   Cut soda , cut ice cream , candy     Try tens on shoulder  Get the split for ot  If dizzy on duloxitiene back off    bp ok no change

## 2016-04-01 ENCOUNTER — Encounter (HOSPITAL_BASED_OUTPATIENT_CLINIC_OR_DEPARTMENT_OTHER): Payer: PPO | Admitting: Anesthesiology

## 2016-04-02 ENCOUNTER — Ambulatory Visit (HOSPITAL_BASED_OUTPATIENT_CLINIC_OR_DEPARTMENT_OTHER): Payer: PPO | Attending: Neurology | Admitting: Neurology

## 2016-04-02 ENCOUNTER — Encounter (HOSPITAL_BASED_OUTPATIENT_CLINIC_OR_DEPARTMENT_OTHER): Payer: Self-pay | Admitting: Neurology

## 2016-04-02 ENCOUNTER — Ambulatory Visit: Payer: PPO

## 2016-04-02 VITALS — BP 121/90 | HR 90 | Temp 98.2°F | Ht 74.33 in | Wt 224.0 lb

## 2016-04-02 DIAGNOSIS — G910 Communicating hydrocephalus: Secondary | ICD-10-CM

## 2016-04-02 DIAGNOSIS — S069X9D Unspecified intracranial injury with loss of consciousness of unspecified duration, subsequent encounter: Secondary | ICD-10-CM | POA: Insufficient documentation

## 2016-04-02 DIAGNOSIS — Z6828 Body mass index (BMI) 28.0-28.9, adult: Secondary | ICD-10-CM

## 2016-04-02 NOTE — Patient Instructions (Signed)
We reviewed the brain MRI. It shows the hydrocephalus, but no obstruction to flow. It also shows changes consistent with the cumulative effects of vascular risk factors. Both of these can cause memory problems.    We will get neuropsychology testing to establish a baseline, and I will see you with the neuropsychologist to review the results.

## 2016-04-02 NOTE — Progress Notes (Signed)
Adult and Transitional Hydrocephalus and CSF Disorders    Reason for Visit:  Ronnie Mason is a 65 year old male who is here for a follow up visit for possible hydrocephalus.  I last saw this patient on 02/21/16.  He is accompanied by his wife.  I have reviewed the prior chart notes on this patient.      History of Present Illness:  Our first visit with him was on 02/21/16.  He had been incidentally discovered to have enlarged ventricles at the time that he was admitted to [facility] on 11/30/15.  He had had a small left thalamic hypertensive hemorrhage, and fell forward at the time that he had it, so that he also had a mild-moderate traumatic brain injury.  He had anterograde amnesia, with no recollection of the first or 4 days of the hospitalization.    With respect to hydrocephalus symptoms, his only gait difficulty was due to low back problems.  He had memory difficulty following the stroke and the head injury, but he and his wife thought this was getting better.  He had no urinary symptoms.  The neurological examination was notable for a MoCA score of 20/30, but was otherwise normal.  The CT scan showed enlarged ventricles with a frontal horn span of 5.7 cm and an Evans ratio of 0.4.  We arranged for him to have an MRI of the brain and then follow-up.  We counseled him that we did not think that his symptoms were from hydrocephalus area without the cognitive impairment was the result of the cerebral hemorrhage and a traumatic brain injury.    Interval changes in the medical or surgical history:  Both he and his wife say that he is continuing to improve.  With respect to the thinking and memory, he is working with a Facilities manager and Building services engineer at [facility] [facility] [employer].  He has one more visit.  His wife also agrees that he is making progress, but thinks she has still some difficulty with remembering things at home.  As before, he has no trouble with his gait aside from the back trouble.  He has  no trouble with the bladder.    He would like to know if he can resume driving.    When asked, he states that he is no longer using alcohol, as previously recommended.    Copies of the patient's complete medical history form and other documentation that I have reviewed, annotated, or created for this visit have been scanned into the electronic medical record.    I personally reviewed and confirmed the past medical history and past surgical history in the record with the patient today. There are no other changes.    ROS    Medications  Outpatient Prescriptions Marked as Taking for the 04/02/16 encounter (Office Visit) with Tillman Abide, MD   Medication Sig Dispense Refill   . Acetaminophen 500 MG Oral Tab 2 tablets bid     . AmLODIPine Besylate 2.5 MG Oral Tab 1 tablet bid dose increase 01/30/2016 180 tablet 3   . Atorvastatin Calcium 10 MG Oral Tab Take 1 tablet (10 mg) by mouth daily. 90 tablet 1   . Cholecalciferol (VITAMIN D3) 2000 units Oral Cap Take 1 capsule (2,000 Units) by mouth daily. For low level 1 capsule 1   . Clobetasol Propionate 0.05 % External Ointment Small amount to rash as needed bid for 1-2 weeks at a time 15 g 1   . Cyanocobalamin 1000  MCG Oral Tab one per day over-the-counter started 5/21 /2012 this level borderline low 1 Tab 1   . Diclofenac Sodium (VOLTAREN) 1 % Transdermal Gel Apply 4 g topically 4 times a day. Apply to neck, trapezius muscle, and low back. No more than 4 g applied at a time. 1 Tube 5   . DULoxetine HCl 30 MG Oral CAPSULE ENTERIC COATED PARTICLES Take 1 capsule (30 mg) by mouth daily. 90 capsule 1   . DULoxetine HCl 60 MG Oral CAPSULE ENTERIC COATED PARTICLES Take 1 capsule (60 mg) by mouth daily. 90 capsule 1   . EPINEPHrine 0.3 MG/0.3ML Injection Solution Auto-injector Inject as instructed per patient package insert, 0.3 mg intramuscularly or subcutaneously into the thigh, if needed to treat anaphylaxis 3 each 1   . Glucosamine-Chondroitin (GLUCOSAMINE CHONDR  COMPLEX OR)      . Lidocaine 5 % External Patch Apply 1 patch onto the skin daily. Apply to painful area for up to 12 hours in a 24 hour period. 30 patch 10   . Lisinopril 20 MG Oral Tab Take 1 tablet (20 mg) by mouth every 12 hours. 180 tablet 1   . Primidone 50 MG Oral Tab 1 po bid 180 tablet 0   . THIAMINE HCL OR      . TURMERIC OR          Allergies  Bee venom; Gabapentin; and Lidocaine    Major Findings:  On examination today, this is a pleasant 65 year old-year-old male.      Vital signs:  BP 121/90   Pulse 90   Temp 98.2 F (36.8 C) (Temporal)   Ht 6' 2.33" (1.888 m)   Wt (!) 224 lb (101.6 kg)   SpO2 98%   BMI 28.50 kg/m       Mental Status:  Assessed for orientation, recent and remote memory, attention, concentration, language, and fund of knowledge. The MoCA version 7.2 score is 26/30, which is 5 points better than a month ago.  He had no difficulty with visuospatial or executive tasks.  He named all 3 animals.  He registered 5 of 5 words for immediate repetition and with delay recalled 2, with one false positive.  He had remaining 3 words with either category cueing or multiple choice.  His only other difficulty was with phonemic fluency. The MoCA form is scanned into the electronic medical record.    Review of Imaging:  I personally reviewed the MRI of the brain from today. On the sagittal view, the corpus callosum profile is effaced, and the Sylvian aqueduct is patent on the CISS sequences with a pulsation artifact seen on the phase-contrast study.  On the axial view, the frontal horn span is 5.7 cm and the Evans ratio is 0.4.  The IIIrd ventricle span is 13 mm.  On the sagittal view, the floor of the IIIrd ventricle is not bowed downward and the pituitary is normal.   The convexity profile is not consistent with DESH, and is not consistent with the [place] high-and-tight criteria. Atrophy is possibly noted over the convexity. There are no malformations or cysts. T2 signal abnormalities  scattered throughout the corona radiata. Infarctions are not present, but the residual of the small thalamic hemorrhages seen.    Assessment:  I spent a total time of 40 minutes face-to-face with the patient, of which more than 50% was spent counseling and coordinating care as outlined in this note.    I explained my thoughts to him and  his wife.  Sydnee CabalDavid Amerman is recovering slowly following the small and cerebral hemorrhage and traumatic brain injury.  It is noteworthy that his MoCA score reveals improvement.  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 REFERRAL TO NEUROPSYCHOLOGY   . REFERRAL TO OCCUPATIONAL THERAPY       Plan:  We talked at length about his recovery and the next steps.  Given that he is essentially symptomatically hydrocephalus, but has to risk factors for cognitive impairment, namely the cerebral microvascular disease and the hydrocephalus, I think that this is an important time to obtain baseline neuropsychological testing.  I have referred him to Dr[facility] at [facility].    With respect to the driving, I highly recommend a formal driving evaluation by occupational therapy.  This helps us to be assured that he has all of the cognitive and motor skills necessary to drive (if he has them), or that he does not and we have good reason to restrict him from driving.  I have written a referral for this evaluation.     I next appointment will be about 4 weeks following the neuropsychology evaluations to that we can review the results together.    Casimiro NeedleMichael A. Mayford KnifeWilliams, MD  Professor of Neurology and Neurological  Surgery  Director, Adult and Transitional Hydrocephalus and CSF Disorders    Note: Parts of this document have beencreated with voice recognition software.Although I have proofread the note, transcription errors may still exist.

## 2016-04-07 ENCOUNTER — Telehealth (HOSPITAL_BASED_OUTPATIENT_CLINIC_OR_DEPARTMENT_OTHER): Payer: Self-pay | Admitting: Rehabilitative and Restorative Service Providers"

## 2016-04-07 NOTE — Telephone Encounter (Signed)
Ronnie Mason,    I just spoke with this patient. He was referring to you as the person he wanted to speak with.    Ronnie Mason

## 2016-04-07 NOTE — Telephone Encounter (Signed)
(  TEXTING IS AN OPTION FOR [facility] CLINICS ONLY)  Is this a [facility] clinic? No      RETURN CALL: General message OK      SUBJECT:  General Message     REASON FOR REQUEST: Follow up     MESSAGE: Patient states he would like to speak to [facility] has questions about upcoming appointment.      Thank  You.

## 2016-04-08 ENCOUNTER — Other Ambulatory Visit: Payer: Self-pay | Admitting: Internal Medicine

## 2016-04-08 DIAGNOSIS — G250 Essential tremor: Secondary | ICD-10-CM

## 2016-04-08 DIAGNOSIS — Z789 Other specified health status: Secondary | ICD-10-CM | POA: Insufficient documentation

## 2016-04-08 DIAGNOSIS — Z7409 Other reduced mobility: Secondary | ICD-10-CM | POA: Insufficient documentation

## 2016-04-08 NOTE — Telephone Encounter (Signed)
The patient last received this medication at the requesting pharmacy on 12/24/15    The directions on the request are different than the directions on the med list.

## 2016-04-09 ENCOUNTER — Ambulatory Visit: Payer: PPO | Attending: Neurology | Admitting: Rehabilitative and Restorative Service Providers"

## 2016-04-09 ENCOUNTER — Encounter (INDEPENDENT_AMBULATORY_CARE_PROVIDER_SITE_OTHER): Payer: Self-pay | Admitting: Internal Medicine

## 2016-04-09 DIAGNOSIS — G8929 Other chronic pain: Secondary | ICD-10-CM

## 2016-04-09 DIAGNOSIS — Z8679 Personal history of other diseases of the circulatory system: Secondary | ICD-10-CM

## 2016-04-09 DIAGNOSIS — M5416 Radiculopathy, lumbar region: Secondary | ICD-10-CM

## 2016-04-09 DIAGNOSIS — R262 Difficulty in walking, not elsewhere classified: Secondary | ICD-10-CM | POA: Insufficient documentation

## 2016-04-09 DIAGNOSIS — M545 Low back pain, unspecified: Secondary | ICD-10-CM

## 2016-04-09 DIAGNOSIS — M25511 Pain in right shoulder: Secondary | ICD-10-CM | POA: Insufficient documentation

## 2016-04-09 DIAGNOSIS — M25512 Pain in left shoulder: Secondary | ICD-10-CM | POA: Insufficient documentation

## 2016-04-09 DIAGNOSIS — S069X9D Unspecified intracranial injury with loss of consciousness of unspecified duration, subsequent encounter: Secondary | ICD-10-CM

## 2016-04-09 DIAGNOSIS — I619 Nontraumatic intracerebral hemorrhage, unspecified: Secondary | ICD-10-CM | POA: Insufficient documentation

## 2016-04-09 DIAGNOSIS — F1021 Alcohol dependence, in remission: Secondary | ICD-10-CM

## 2016-04-09 DIAGNOSIS — M542 Cervicalgia: Secondary | ICD-10-CM

## 2016-04-09 DIAGNOSIS — R2689 Other abnormalities of gait and mobility: Secondary | ICD-10-CM

## 2016-04-09 DIAGNOSIS — M5489 Other dorsalgia: Secondary | ICD-10-CM | POA: Insufficient documentation

## 2016-04-09 DIAGNOSIS — M25551 Pain in right hip: Secondary | ICD-10-CM | POA: Insufficient documentation

## 2016-04-09 MED ORDER — PRIMIDONE 50 MG OR TABS
50.0000 mg | ORAL_TABLET | Freq: Two times a day (BID) | ORAL | 0 refills | Status: DC
Start: 2016-04-09 — End: 2016-07-06

## 2016-04-09 NOTE — Progress Notes (Signed)
Physical Therapy Progress Note  This note serves as a CMS evaluation form that requires the referring provider to certify the need for therapy services furnished under this Plan of Treatment. The signing provider is certifying the plan of care.    The following patient identifiers were confirmed today: name and date of birth.    Certification From: 37/85/88  Certification To: 50/27/74    Date of Symptom Onset : 11/30/15  Start of Care Date: 01/23/16    Visits from Start of Episode: 10    Referring Provider: Royce Macadamia*    NPI: [id number]  Reason for Referral: 65 year old male with hypertensive L thalamic ICH.   Treatment/Therapy diagnosis:   Encounter Diagnoses   Name Primary?   . Difficulty in walking, not elsewhere classified Yes   . Chronic pain of both shoulders    . Back pain, lumbosacral    . Intraparenchymal hemorrhage of brain ([place])    . Balance problem    . Pain of right hip joint      Precautions: None          PERTINENT MEDICAL/SURGICAL HISTORY:   Past Medical History:   Diagnosis Date   . Carotid Sinus Hypersensitivity    . HYPERTENSION     . HYPOTENSION     . Intraparenchymal hemorrhage of brain ([place]) 2017   . Pain of right hip joint 01/23/2016   . 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 Syncope    . 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([place])    . URI (upper respiratory infection)       X-ray findings: 02/19/2016  RIGHT SIDE: Moderate glenohumeral joint osteoarthropathy. Mild elevation of   the humeral head. Prominent spur the acromion nearly contacts the humeral   head. AC joint mild proliferation. Coracoid unremarkable. No fracture.   Probable osteopenia accounting for subtle lucencies in the proximal humerus.    LEFT SIDE: Mild lateral humeral joint osteoarthropathy.    Spur the acromion. AC joint mild proliferation. Coracoid unremarkable. No   fracture. No loose body.    Past Surgical History:   Procedure Laterality Date   . ANES; COLONOSCOPY  2002    repeat  in 3 years   . ANES; COLONOSCOPY & POLYPECTOMY  11/18/2007    repeat in 3 years   . UNLISTED PROCEDURE FEMUR/KNEE     . UNLISTED PROCEDURE HANDS/FINGERS     . UNLISTED PROCEDURE SPINE  2013    rfa to c3 to c 7      OBJECTIVE MEASURES  -Attention/Cognition/Speech: Difficulty with short term memory which has improved since going home.   -Posture: forward head, rounded shoulders, R shoulder elevated compared to L,falttening in his lumbar spine.   -Sensation: Intact to light touch per report except numbness in the lateral calf to the bottom of the feet  -Tone assessment: (Measured via MAS): 0/4 in B hamstrings, quads, and calves    LE Strength: 01/23/2016    L LE (_/5) R LE (_/5)   Hip Flexion 4 4- (gives away due to hip pain)   Hip Ext 4 4   Hip Abduction 4 4   Knee Extension 5 5   Knee Flexion 5 5   Ankle Dorsiflexion 4 4     UE Strength: 01/23/2016    L UE (_/5) R UE (_/5)   Shoulder Abduction 4- (c/o shoulder pain) 4- (c/o shoulder pain)   Shoulder Flexion 4 4   Shoulder IR 5 5  Shoulder ER 4 4   Elbow Flexion 5 5   Elbow Extension 5 5     Range of Motion:  -Slight tightness in hamstring and hip flexors  -Otherwise WFL    -AROM in degrees: 01/30/2016   R shoulder flexion: 105   L shoulder flexion: 75   R shoulder abduction: 110   L shoulder abduction: 75    -AROM in degrees: 03/05/2016   R shoulder flexion: 135   L shoulder flexion: 110   R shoulder abduction: 130   L shoulder abduction: 85    -PROM in degrees: 01/30/2016   R shoulder flexion: 120    L shoulder flexion: 130   R shoulder abduction: 115   L shoulder abduction: 75     -Ober's test positive on R    -Functional Mobility:    -Bed mobility: Independent with increased time due to back and shoulder pain    -Transfers: Independent     -Gait: good foot clearance, narrow BOS, decreased arm swing on the R compared to the L     -Balance:   Mini Balance Evaluation Systems Test (Mini BEST test)    1. Sit to stand: 2  2. Rise to toes: 1  3. Stand on one leg: 1  Left:  1  Right: 1  4. Compensatory stepping correction - forward: 2  5. Compensatory stepping correction - backward:2  6. Compensatory stepping correction - lateral: 2  Left: 2  Right: 2  7. Stance (feet together); eyes open, firm surface: 2  8. Stance (feet together); eyes closed, foam surface: 2  9. Incline - Eyes closed: 2  10.  Change in gait speed: 2  11.  Walk with head turns - horizontal: 2  12.  Walk with pivot turns: 2  13.  Step over obstacles: 2  14.  Timed up and go with dual task: 0  TUG: 08.55  TUG with dual task: Unable    Initial Score:  19/28 (01/23/2016)  Subsequent score:  23/28 (03/12/2016)  Most Recent score: 24/28 (04/09/2016)    Score interpretation:   Chronic Stroke - Francie Massing et al 2013): Scores of ? 17.5 identified those with a history of falling, sensitivity 64%, specificity 64% AUC 0.64  Parkinson's Disease: - Scores of ? 19 identified recurrent fallers; sensitivity 79%, specificity 67% AUC 0.75 (King et al 2012). Scores of ? 21 differentiate those with and without postural response deficits with a sensitivity of 89%, specificity 81%    Modified CTSIB    1. Stand on firm surface with eyes open: 30/30 sec  2. Stand on firm surface with eyes closed: 30/30 sec  3. Stand on foam with eyes open: 30/30 sec  4. Stand on foam with eyes closed: 30/30 sec (significant increase in sway)    Initial score.  120  out of 120 sec (01/23/2016)  Most recent score:   out of 120 sec  ______________________________________________________________________________    04/09/2016    TREATMENT & EDUCATION:  Time in: 0817 Total Treatment time/minutes: 40    SUBJECTIVE:  Pt has been doing his shoulder exercises but they have not been having [name] push down on his shoulder.    Have you fallen since your last visit? No  Pain: 2-3/10 in B shoulders    1. Manual Therapy for 25 minutes  -Shoulder traction  -Inferior grade III GH mobilization with shoulder elevation  -STM to teres major and minor.   -Posterior grade III GH  mobilization  -Following  manual therapy pt showing greater improvement in his R shoulder abduction ROM compared to his L shoulder.  Reinforced having [name] perform inferior GH glide.     2. Neuromuscular Re-education for 15 minutes   -Performed modified CTSIB.  See score above for details.     Home exercise instruction: verbal instructions and demonstrations were provided for the above exercises  a handout was provided describing the exercises in written and picture format  The patient was able to demonstrate the exercises after instructions.    -Hip flexor stretch  -Cervical side bend stretch    -Wall angles with shoulder retraction .   -Shoulder ER with red band.    -Shoulder Rows with a red band.   -Shoulder punches with a red band.   -GH inferior glide.   -Doorway pec stretch.   -Posterior pelvic tilt with heel slides   -Single leg bridges  -Lumbar rotation over the therapy ball.     -Narrow BOS with eyes closed with head turns on foam   -Tandem stance with eyes closed  -Balance on tip toes  -Heel toe walking  -Grapevine walking  -Clock balance exercises     ASSESSMENT OF TREATMENT PROVIDED TODAY: Pt continues to show improvement with his shoulders with manual therapy.  Shows improvement by one point in his MiniBEST test. Continues to require skilled PT which will shift focus to higher level balance.     DISCHARGE GOALS  to be met by:  04/22/2016  Patient Goals: Improve his balance     Functional Discharge      Goals Short Term Goals Impairments Current Level of Function /   Progress Toward Goal   1. Pt will improve his MiniBEST test to 23/28 to demonstrate improved balance.     2. Pt will be report his pain in his shoulder on average being a 2/10.     3. Pt will be able to sit with good posture without cueing. A. Pt will be independent in his HEP.     B. Pt will be able to perform dual task DGI with only a 10% time difference between each other. Weakness   Decreased sensation  Decreased balance 1. GOAL  MET    2. Worked on his shoulder today with improvements in ROM. Average pain is 2-3/10    3. Showing improved self-awareness    A.Continued to reinforce    B.Goal met      These goals were discussed and the patient agrees with them.    PLAN OF CARE:  This patient will be seen for 1 therapy sessions per week over a 90 day time frame.  The patient will perform the instructed home program 2-3 times per day.  The therapy will include therapeutic exercise, manual therapy, ultrasound, therapeutic activities, neuromuscular reeducation and gait training    Intervention for next treatment:   -Last PT visit   -Duel task activities  -Progress his balance program    Morley Kos PT, DPT  [facility] Rehabilitation Department   Physical Therapy   [phone] (Pager)     Referring Physician/Practitioner Certification Statement:   I certify the need for these services furnished under this plan of treatment and while under my care.      Signature:__________________________________Date:___________________

## 2016-04-09 NOTE — Telephone Encounter (Signed)
Please send

## 2016-04-10 NOTE — Addendum Note (Signed)
Addended by: Shann MedalKRAFT, Kerney Hopfensperger VISESKUL on: 04/10/2016 11:49 AM     Modules accepted: Orders

## 2016-04-14 ENCOUNTER — Ambulatory Visit (HOSPITAL_BASED_OUTPATIENT_CLINIC_OR_DEPARTMENT_OTHER): Payer: PPO | Admitting: Speech-Language Pathologist

## 2016-04-14 DIAGNOSIS — Z8679 Personal history of other diseases of the circulatory system: Secondary | ICD-10-CM

## 2016-04-14 DIAGNOSIS — R41841 Cognitive communication deficit: Secondary | ICD-10-CM

## 2016-04-14 NOTE — Progress Notes (Signed)
Speech Therapy Progress Note    The following patient identifiers were confirmed today: name and date of birth.    General Assessment  Date of Symptom Onset : 11/30/15  Start of Care Date: 01/23/16  Reason for Referral: cognitive communication impairment  Pain Score: 0 - No pain  Have you fallen in the past year?: Yes  Fall Screen Date: 01/23/16  Interpreter Status: Not Needed      Visits from start of care: 10    Referring Provider: Royce Macadamia*    Treatment/Therapy diagnosis:   Encounter Diagnoses   Name Primary?   [name] Hx of spontan intraparenchymal intracran bleed assoc with hypertension Yes   . Cognitive communication deficit          SUBJECTIVE:   Patient Statement: Ronnie Mason was on time to the appointment and participated well throughout the session.  He was accompanied by his wife, who was present for the entire session.  Answered questions from Ronnie Mason and his wife regarding use of cognitive puzzle books and online activities to help with recovery.  Encouraged Ronnie Mason to find tasks that are engaging to him and suggested that he might benefit more from taking a class rather than doing brain teasers that he finds frustrating.  Ronnie Mason reports that his neurologist has indicated that he is ready to take a driving evaluation and will look into scheduling this with OT at [facility].                       TREATMENT & EDUCATION:   Time in: 9:00 am     Duration: 55 minutes     Interventions/Treatment provided:    Cognitive skills Tx    Memory:  Ronnie Mason reports that he is continuing to use his planner and notebook.  However, he did not follow through with completing the homework assignment, but he reports that he misplaced the notes that he was given.  Reviewed strategies for note taking so that notes are most effective and easy to locate.  He agreed with this and will continue to focus on using strategies more consistently.       Attention/planning/organizing:  Completed task to determine best insurance plan from choice of 6  based upon given information.  He required moderate cues to develop and utilize effective strategies to focus on salient information and sustain that attention to details to be able to arrive at accurate decision.  He benefited from using a structured system such as a chart to manage and then evaluate this information.        ASSESSMENT OF TREATMENT: Ronnie Mason verbalized good understanding of strategies that were taught and practiced today for increased note taking as well as improved attention in complex reasoning.  He is aware of need to implement strategies and benefits when strategies are in place, but continues to require cues to develop and implement them in novel tasks.         DISCHARGE GOALS:    Goal #1: Pt will develop and utilize strategies for increased recall of daily information with minimal to no cues.  Time frame:  April 02, 2016  Progress toward goal:  Ronnie Mason is using strategies for increased recall with minimal to no cues.  Goal met.    Goal #2: Pt will develop and utilize strategies for improved attention and problem solving with minimal cues in moderately complex reasoning tasks.  Time frame:  April 02, 2016  Progress toward goal:  Continued to develop and train  in strategies for improved attention and problem solving.  Ronnie Mason requires up to moderate cues to develop effective strategies for more complex problem solving, but can complete moderately complex reasoning tasks with minimal cues.  Goal met.        Rehabilitation potential is: Good  Potential Barriers to achieve rehab goals: memory  These goals were discussed and the patient agrees with them.    Plan of care: Ronnie Mason was encouraged to continue to work on using his daily planner and to do lists for making a plan each day and then following that plan.  He will also continue to work on implementing strategies for more complex problem solving at home.  He has met current goals of treatment an no further speech therapy is scheduled at this time.   Discharge from therapy.          Domingo Cocking Nimah Uphoff, MS, CCC-SLP, BC-ANCDS

## 2016-04-16 ENCOUNTER — Encounter (INDEPENDENT_AMBULATORY_CARE_PROVIDER_SITE_OTHER): Payer: Self-pay | Admitting: Plastic and Reconstructive Surgery

## 2016-04-16 ENCOUNTER — Encounter (HOSPITAL_BASED_OUTPATIENT_CLINIC_OR_DEPARTMENT_OTHER): Payer: PPO | Admitting: Speech-Language Pathologist

## 2016-04-16 ENCOUNTER — Ambulatory Visit (HOSPITAL_BASED_OUTPATIENT_CLINIC_OR_DEPARTMENT_OTHER): Payer: PPO | Admitting: Rehabilitative and Restorative Service Providers"

## 2016-04-16 DIAGNOSIS — Z7409 Other reduced mobility: Secondary | ICD-10-CM

## 2016-04-16 DIAGNOSIS — I619 Nontraumatic intracerebral hemorrhage, unspecified: Secondary | ICD-10-CM

## 2016-04-16 DIAGNOSIS — R2689 Other abnormalities of gait and mobility: Secondary | ICD-10-CM

## 2016-04-16 DIAGNOSIS — R262 Difficulty in walking, not elsewhere classified: Secondary | ICD-10-CM

## 2016-04-16 DIAGNOSIS — M25511 Pain in right shoulder: Secondary | ICD-10-CM

## 2016-04-16 DIAGNOSIS — G8929 Other chronic pain: Secondary | ICD-10-CM

## 2016-04-16 DIAGNOSIS — M545 Low back pain, unspecified: Secondary | ICD-10-CM

## 2016-04-16 DIAGNOSIS — Z789 Other specified health status: Secondary | ICD-10-CM

## 2016-04-16 DIAGNOSIS — G629 Polyneuropathy, unspecified: Secondary | ICD-10-CM

## 2016-04-16 NOTE — Progress Notes (Signed)
Physical Therapy Progress Note  This note serves as a CMS evaluation form that requires the referring provider to certify the need for therapy services furnished under this Plan of Treatment. The signing provider is certifying the plan of care.    The following patient identifiers were confirmed today: name and date of birth.    Certification From: 99/37/16  Certification To: 96/78/93    Date of Symptom Onset : 11/30/15  Start of Care Date: 01/23/16    Visits from Start of Episode: 11    Referring Provider: Royce Macadamia*    NPI: [id number]  Reason for Referral: 65 year old male with hypertensive L thalamic ICH.   Treatment/Therapy diagnosis:   Encounter Diagnoses   Name Primary?   . Difficulty in walking, not elsewhere classified Yes   . Chronic pain of both shoulders    . Back pain, lumbosacral    . Intraparenchymal hemorrhage of brain ([place])    . Balance problem      Precautions: None          PERTINENT MEDICAL/SURGICAL HISTORY:   Past Medical History:   Diagnosis Date   . Carotid Sinus Hypersensitivity    . HYPERTENSION     . HYPOTENSION     . Intraparenchymal hemorrhage of brain ([place]) 2017   . Pain of right hip joint 01/23/2016   . 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 Syncope    . 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([place])    . URI (upper respiratory infection)       X-ray findings: 02/19/2016  RIGHT SIDE: Moderate glenohumeral joint osteoarthropathy. Mild elevation of   the humeral head. Prominent spur the acromion nearly contacts the humeral   head. AC joint mild proliferation. Coracoid unremarkable. No fracture.   Probable osteopenia accounting for subtle lucencies in the proximal humerus.    LEFT SIDE: Mild lateral humeral joint osteoarthropathy.    Spur the acromion. AC joint mild proliferation. Coracoid unremarkable. No   fracture. No loose body.    Past Surgical History:   Procedure Laterality Date   . ANES; COLONOSCOPY  2002    repeat in 3 years   . ANES;  COLONOSCOPY & POLYPECTOMY  11/18/2007    repeat in 3 years   . UNLISTED PROCEDURE FEMUR/KNEE     . UNLISTED PROCEDURE HANDS/FINGERS     . UNLISTED PROCEDURE SPINE  2013    rfa to c3 to c 7      OBJECTIVE MEASURES  -Attention/Cognition/Speech: Difficulty with short term memory which has improved since going home.   -Posture: forward head, rounded shoulders, R shoulder elevated compared to L,falttening in his lumbar spine.   -Sensation: Intact to light touch per report except numbness in the lateral calf to the bottom of the feet  -Tone assessment: (Measured via MAS): 0/4 in B hamstrings, quads, and calves    LE Strength: 01/23/2016    L LE (_/5) R LE (_/5)   Hip Flexion 4 4- (gives away due to hip pain)   Hip Ext 4 4   Hip Abduction 4 4   Knee Extension 5 5   Knee Flexion 5 5   Ankle Dorsiflexion 4 4     UE Strength: 01/23/2016    L UE (_/5) R UE (_/5)   Shoulder Abduction 4- (c/o shoulder pain) 4- (c/o shoulder pain)   Shoulder Flexion 4 4   Shoulder IR 5 5   Shoulder ER 4 4   Elbow  Flexion 5 5   Elbow Extension 5 5     Range of Motion:  -Slight tightness in hamstring and hip flexors  -Otherwise WFL    -AROM in degrees: 01/30/2016   R shoulder flexion: 105   L shoulder flexion: 75   R shoulder abduction: 110   L shoulder abduction: 75    -AROM in degrees: 03/05/2016   R shoulder flexion: 135   L shoulder flexion: 110   R shoulder abduction: 130   L shoulder abduction: 85    -PROM in degrees:03/05/2016   R shoulder flexion: 120    L shoulder flexion: 130   R shoulder abduction: 115   L shoulder abduction: 75     -Ober's test positive on R    -Functional Mobility:    -Bed mobility: Independent with increased time due to back and shoulder pain    -Transfers: Independent     -Gait: good foot clearance, narrow BOS, decreased arm swing on the R compared to the L     -Balance:   Mini Balance Evaluation Systems Test (Mini BEST test)    1. Sit to stand: 2  2. Rise to toes: 1  3. Stand on one leg: 1  Left: 1  Right:  1  4. Compensatory stepping correction - forward: 2  5. Compensatory stepping correction - backward:2  6. Compensatory stepping correction - lateral: 2  Left: 2  Right: 2  7. Stance (feet together); eyes open, firm surface: 2  8. Stance (feet together); eyes closed, foam surface: 2  9. Incline - Eyes closed: 2  10.  Change in gait speed: 2  11.  Walk with head turns - horizontal: 2  12.  Walk with pivot turns: 2  13.  Step over obstacles: 2  14.  Timed up and go with dual task: 2  TUG: 08.55  TUG with dual task: 8.50    Initial Score:  19/28 (01/23/2016)  Subsequent score:  23/28 (03/12/2016), 24/28 (04/09/2016)  Most Recent score: 26/28 (04/09/2016)    Score interpretation:   Chronic Stroke - Francie Massing et al 2013): Scores of ? 17.5 identified those with a history of falling, sensitivity 64%, specificity 64% AUC 0.64  Parkinson's Disease: - Scores of ? 19 identified recurrent fallers; sensitivity 79%, specificity 67% AUC 0.75 (King et al 2012). Scores of ? 21 differentiate those with and without postural response deficits with a sensitivity of 89%, specificity 81%    Modified CTSIB    1. Stand on firm surface with eyes open: 30/30 sec  2. Stand on firm surface with eyes closed: 30/30 sec  3. Stand on foam with eyes open: 30/30 sec  4. Stand on foam with eyes closed: 30/30 sec (significant increase in sway)    Initial score.  120  out of 120 sec (01/23/2016)  Most recent score:   out of 120 sec  ______________________________________________________________________________    04/16/2016    TREATMENT & EDUCATION:  Time in: 1004 Total Treatment time/minutes: 55 minutes    SUBJECTIVE:  Pt has been doing his shoulder exercises but they have not been having [name] push down on his shoulder.    Have you fallen since your last visit? No  Pain: 2-3/10 in B shoulders    1. Therapeutic Activity for 15 minutes   -Performed MiniBEST test.  See score above for details.     2. Therapeutic Exercise for 40 minutes  -Vitals before activity: BP  133/90 HR 90 bpm   -Instructed  on RPE and how to use it to judge how hard he is working. Discussed aerobic exercise recommendations.   -Upright bike for 10 minutes with an RPE of 5/10.  -Vitals after activity: BP 153/98 HR 96 bpm     -Verbally reviewed his shoulder exercise program.   -Reviewed his balance exercise program and progressed his exercises with discussion of progression.    -Feet together on foam cushion.   -SLS  -Heel toe stance  -Clock SLS balance exercise   -Pivoting to the R and L  -Heel toe walking  -Grapevine walking.    Home exercise instruction: verbal instructions and demonstrations were provided for the above exercises  a handout was provided describing the exercises in written and picture format  The patient was able to demonstrate the exercises after instructions.    -Hip flexor stretch  -Cervical side bend stretch    -Wall angles with shoulder retraction .   -Shoulder ER with red band.    -Shoulder Rows with a red band.   -Shoulder punches with a red band.   -GH inferior glide.   -Doorway pec stretch.   -Posterior pelvic tilt with heel slides   -Single leg bridges  -Lumbar rotation over the therapy ball.     -Narrow BOS with eyes closed with head turns on foam   -Tandem stance with eyes closed  -Balance on tip toes  -Heel toe walking  -Grapevine walking  -Clock balance exercises     ASSESSMENT OF TREATMENT PROVIDED TODAY: Pt continues to show improvement in his balance as demonstrated by improvement in his MiniBEST test.  Able to progress his program.  Pt shows overall improvement in his shoulder pain.  He is independent in his HEP and will follow up with his local orthopaedic PT for his shoulder and back pain.     DISCHARGE GOALS    Patient Goals: Improve his balance     Functional Discharge      Goals Short Term Goals Impairments Current Level of Function /   Progress Toward Goal   1. Pt will improve his MiniBEST test to 23/28 to demonstrate improved balance.     2. Pt will be report his  pain in his shoulder on average being a 2/10.     3. Pt will be able to sit with good posture without cueing. A. Pt will be independent in his HEP.     B. Pt will be able to perform dual task DGI with only a 10% time difference between each other. Weakness   Decreased sensation  Decreased balance 1. GOAL MET    2. Shows significant improvement in shoulder ROM    3. Showing improved self-awareness    A.Goal met    B.Goal met      These goals were discussed and the patient agrees with them.    PLAN OF CARE:  No further OP PT at this time.     Morley Kos PT, DPT  [facility] Rehabilitation Department   Physical Therapy   [phone] (Pager)     Referring Physician/Practitioner Certification Statement:   I certify the need for these services furnished under this plan of treatment and while under my care.      Signature:__________________________________Date:___________________

## 2016-04-16 NOTE — Patient Instructions (Addendum)
*  Perform this in the corner for safety.     -Feet together. Eyes closed. Standing on a foam cushion.    +Once you can do that safely for 30 seconds.  Add in horizontal/vertical head turns.     -Standing on one leg. Goal is to get to 20-30 seconds.     -Heel toe stance on flat ground.   +Once you can do it easily for 30 seconds.  Progressed to eyes closed.     -Clock balance exercise. Stand on one leg and use the other leg to reach in a clock wise direction. Try reaching your leg further.     -Pivoting to the left and right.  You should be picking up your heel on the pivoting foot.       Walking Balance Exercises:     -Grapevine walking: Over-out-behind-out. You should be working on your balance when you cross your feet. Do this along a counter where you can hold onto something.     -Heel toe walking along a line. Start out with some space in between your heel and toe.  Work towards doing this with your heel toe touching.

## 2016-04-17 ENCOUNTER — Encounter (HOSPITAL_BASED_OUTPATIENT_CLINIC_OR_DEPARTMENT_OTHER): Payer: Self-pay

## 2016-04-22 ENCOUNTER — Ambulatory Visit (HOSPITAL_BASED_OUTPATIENT_CLINIC_OR_DEPARTMENT_OTHER): Payer: PPO | Attending: Anesthesiology | Admitting: Anesthesiology

## 2016-04-22 DIAGNOSIS — M545 Low back pain: Secondary | ICD-10-CM | POA: Insufficient documentation

## 2016-04-22 DIAGNOSIS — M4696 Unspecified inflammatory spondylopathy, lumbar region: Secondary | ICD-10-CM | POA: Insufficient documentation

## 2016-04-24 ENCOUNTER — Encounter (HOSPITAL_BASED_OUTPATIENT_CLINIC_OR_DEPARTMENT_OTHER): Payer: PPO

## 2016-04-30 ENCOUNTER — Ambulatory Visit: Payer: PPO | Attending: Internal Medicine

## 2016-04-30 DIAGNOSIS — Z8679 Personal history of other diseases of the circulatory system: Secondary | ICD-10-CM | POA: Insufficient documentation

## 2016-05-05 NOTE — Progress Notes (Signed)
Occupational Therapy Progress Note    The following patient identifiers were confirmed today: name and date of birth.        Onset: 11/30/15  Visits from start of Episode: 3    Referring Provider: Royce Mason*       Treatment/Therapy diagnosis:   Encounter Diagnoses   Name Primary?   . Intraparenchymal hemorrhage of brain ([place]) Yes   . Impaired mobility and ADLs    . Sensory neuropathy      Precautions: none    PERTINENT MEDICAL/SURGICAL HISTORY:   Past Medical History:   Diagnosis Date   . Carotid Sinus Hypersensitivity    . HYPERTENSION     . HYPOTENSION     . Intraparenchymal hemorrhage of brain ([place]) 2017   . Pain of right hip joint 01/23/2016   . 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 Syncope    . 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([place])    . URI (upper respiratory infection)         Past Surgical History:   Procedure Laterality Date   . ANES; COLONOSCOPY  2002    repeat in 3 years   . ANES; COLONOSCOPY & POLYPECTOMY  11/18/2007    repeat in 3 years   . UNLISTED PROCEDURE FEMUR/KNEE     . UNLISTED PROCEDURE HANDS/FINGERS     . UNLISTED PROCEDURE SPINE  2013    rfa to c3 to c 7        Previous interventions: Discharged to home on 12/10/15 with intermittent supervision from his wife and started home PT, OT, and SLP.  Has been participating well with the therapies.       Home Environment: Ronnie Mason lives with his wife Ronnie Mason in a 3 story home including basement (where the laundry, office and bathroom are). Master bath has tub/shower combination. He uses a walk in shower in the bathroom he uses and has a standard height toilet there. His wife has an ADA toilet in the master bath. His wife works 3-4 days per week and provides intermittent supervision.    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 Ronnie Mason has two adult children that live in the area and 2 grandchildren    Fall Risk: The patient fell on 11/30/15 which led to his hospitilization but none since then.          SUBJECTIVE:   Patient Statement: Ronnie Mason arrived on time and accompanied by his wife Ronnie Mason today. He says that he met with his physician who relayed that it was okay to do the splints given his waking up with fisted hands on occasion.        The patient is reporting 5-6/10 level of pain now, on a 0-10 Numeric Pain Scale. Location is in right foot and right thumb. Swelling and pain has gone down in his right thumb. He occasionally wears a hand-based thumb splint.    Aggravating factors: walking long distances. Alleviating factors: ice, rest. Also has decades of chronic back pain. Having pain in both shoulders during active ROM into shoulder abduction.      Barriers to learning: memory                   Preferred Learning style: seeing    TREATMENT & EDUCATION:    Interventions/Treatment provided:      1. Wrist Hand Finger Orthosis, rigid without joints, prefabricated for 25 minutes (Qty 2)  Fit and provided right and left prefab resting  hand splints today including strapping and donning/doffing techniques.     2. Orthotic management and train for 10 minutes  Educated [place] in purposes of and wearing schedule of his new splints. Encouraged him to work up to an overnight wearing schedule and alternate nights, L and R.     ASSESSMENT: Ronnie Mason was fit and provided with bilateral resting hand splints, to alternate wearing at night in order to manage his fisting. No furhter concerns for outpatient OT.      These goals were discussed and the patient actively participated in developing them.     DISCHARGE GOALS      Functional Discharge     Goals Short Term Goals Current Level of Function /   Progress Toward Goal Test/Outcome Measure Score & Date   1. Patient will report increased activity tolerance / productivity / IADL participation within 3 months' time.    Patient will report increased activity tolerance / productivity / IADL participation within 2 months' time.  Patient reports improved sleep with subsequent improvement  in productivity in the daytime.   Patient report    COPM     2. Patient will complete home exercise program aimed to manage pain, increase UE strength and coordination within 3 months' time.  Initiate HEP for B UEs. Coordination improved per 9 hole peg test administered last time.  9 hole peg test     Plan of care:   This patient is discharged today.        Ronnie Mason, OTR/L

## 2016-05-07 ENCOUNTER — Encounter: Payer: PPO | Attending: Internal Medicine

## 2016-05-08 ENCOUNTER — Ambulatory Visit (HOSPITAL_BASED_OUTPATIENT_CLINIC_OR_DEPARTMENT_OTHER): Payer: PPO | Attending: Anesthesiology | Admitting: Anesthesiology

## 2016-05-08 DIAGNOSIS — G8929 Other chronic pain: Secondary | ICD-10-CM

## 2016-05-08 DIAGNOSIS — M5431 Sciatica, right side: Secondary | ICD-10-CM

## 2016-05-08 NOTE — Progress Notes (Addendum)
DIAGNOSTIC NERVE BLOCK OF LUMBAR FACET JOINTS #2    PROCEDURE  Lumbar Medial Branch blocks  right L4/5 and L5/S1 facet joints were blocked with bony targets at L4, L5, and the sacral ala, to block the L3, L4 medial branches, and the L5 dorsal ramus.    PRE-PROCEDURE DIAGNOSIS  (M54.5,  G89.29) Chronic right-sided low back pain, with sciatica presence unspecified  (primary encounter diagnosis).    POST-PROCEDURE DIAGNOSIS  (M54.5,  G89.29) Chronic right-sided low back pain, with sciatica presence unspecified  (primary encounter diagnosis).    INDICATION FOR INJECTION  Ronnie Mason has moderate to severe axial pain which impacts his  function and quality of life and has been present for more than 3 months.  He has had reasonable conservative treatment efforts including physical therapy and medication management.  His imaging does not reveal another source of pain and is consistent with a facet source for his pain. His physical examination is consistent with a facet source for his pain.   If two injections on two different sessions lead to substantial pain relief and improvement in function, we would conclude that the tested joints are likely a relevant source of his pain. In this case, radiofrequency ablation of the nerves could be offered as a treatment.    ATTENDING PHYSICIAN: Melven SartoriusKatherin [name], MD    FELLOW / RESIDENT: Diesel Lina, DO    SEDATION:  NO    NPO STATUS:  NO    INTRAVENOUS LINE  NO    IMAGING TECHNIQUE: Multiplanar fluoroscopic guidance was used for this procedure, appropriate images were saved.     TIME OUT/FINAL VERIFICATION: performed and documented.    POSITION:  Prone    PREP:  Chloroprep and sterile drapes.  Sterile technique used throughout.     DESCRIPTION OF PROCEDURE  The C-arm was positioned to afford optimal view of right L4 & 5 transverse processes and sacral alar notches.  Lidocaine 1%  without added sodium bicarbonate  was used to anesthetize the skin at each level. 25 G 3.5 inch  needles with the tips curved were positioned inferior and lateral to the intersection of the transverse process and pedicle at L4 & L5 right, and over the sacral alar notch right. They were advanced under intermittent multiplanar fluoroscopy until they contacted the respective targets.  The position of the needles was optimized from the oblique angle.    Local anesthetic, bupivacaine 0.5% 0.5 ml (for L4/5) and 1 ml for (l5/S1) was injected via each needle (total of 3 injections).    The needles were withdrawn.    COMPLICATIONS:   None.      POST-PROCEDURE:    He was escorted to the recovery area in stable condition where he  made an uneventful recovery.    Ronnie Mason was reassessed after the procedure.  Ronnie Mason did note initial improvement in pain, including pain with movement.     PLAN  Ronnie Mason  was instructed to keep his  pain diary and report the results of this procedure.  Subsequent care will be based on the results of today's procedure.    Melvenia BeamShweta Zariah Jost, DO  Pain Management Fellow

## 2016-05-08 NOTE — Progress Notes (Signed)
I was present for the entirety of the procedure and agree with the fellow's documentation by Dr. Beckie Busingeckchandani.    The patient did not have the pain diary available for review at today's visit, but he reported 70% reduction in pain in the expected distribution for at least several hours following his first diagnostic injection.

## 2016-05-11 ENCOUNTER — Encounter (HOSPITAL_BASED_OUTPATIENT_CLINIC_OR_DEPARTMENT_OTHER): Payer: Self-pay | Admitting: Neurology

## 2016-05-11 ENCOUNTER — Encounter (INDEPENDENT_AMBULATORY_CARE_PROVIDER_SITE_OTHER): Payer: Self-pay | Admitting: Internal Medicine

## 2016-05-11 ENCOUNTER — Telehealth (HOSPITAL_BASED_OUTPATIENT_CLINIC_OR_DEPARTMENT_OTHER): Payer: Self-pay | Admitting: Neurology

## 2016-05-11 ENCOUNTER — Ambulatory Visit (INDEPENDENT_AMBULATORY_CARE_PROVIDER_SITE_OTHER): Payer: PPO | Admitting: Internal Medicine

## 2016-05-11 VITALS — BP 138/89 | HR 86 | Ht 74.33 in | Wt 225.4 lb

## 2016-05-11 DIAGNOSIS — I619 Nontraumatic intracerebral hemorrhage, unspecified: Secondary | ICD-10-CM

## 2016-05-11 DIAGNOSIS — E559 Vitamin D deficiency, unspecified: Secondary | ICD-10-CM

## 2016-05-11 DIAGNOSIS — Z6828 Body mass index (BMI) 28.0-28.9, adult: Secondary | ICD-10-CM

## 2016-05-11 DIAGNOSIS — R197 Diarrhea, unspecified: Secondary | ICD-10-CM

## 2016-05-11 DIAGNOSIS — G8929 Other chronic pain: Secondary | ICD-10-CM

## 2016-05-11 DIAGNOSIS — Z23 Encounter for immunization: Secondary | ICD-10-CM

## 2016-05-11 NOTE — Patient Instructions (Signed)
Consider setting a clock and limit 1 hr sleep   And never sleep after 1 pm

## 2016-05-11 NOTE — Progress Notes (Signed)
Ronnie Mason is a 65 year old male     Review of patient's allergies indicates:  Allergies   Allergen Reactions   . Bee Venom Hives, Itching and Swelling   . Gabapentin      Flu like symptoms, diarrhea, body ache upset stomach   . Lidocaine Rash     Lidocaine patch. PATIENT STATES HE IS ALLERGIC TO THE ADHESIVE NOT THE PATCH.       BP 138/89   Pulse 86   Ht 6' 2.33" (1.888 m)   Wt (!) 225 lb 6.4 oz (102.2 kg)   SpO2 96%   BMI 28.68 kg/m     Chief Complaint   Patient presents with   . Follow-Up      1 month follow up       HPI  History of:    pcp [name], MD   HM needed :       Alcohol use   Neuropathy, chronic pain    Cognitive impairment post intraparenchymal hemorrhage brain    Last visit w/ me    Today follow-up above issues:         Dr Amada Jupiter - injection Medial branch block has been able to move, legs back - feeling , occassional - pain 2 ibuprofen  bid and tylenol - ok for now, he said pain in good control     He then said he wants something to help with pain    - meloxicam did not help - pain however close to zero , sleep not ok -I get the sense not from pain      wake up at 3 am - still taking nap- late nap 1/2 hr 45  Bed 9-10 and up 3-4 am   Then feel the need to go back to sleep  Can be up for 2-3 hrs , was doing after head injury   He is getting a lot of sleep is the sense from his wife    Had 2 drink w vodka 5 week ago and now   Last wkend small pint, had a couple of shots   Bought self alcohol   He said he did not enjoy it    Driving - ot - but we got the message that there were a couple of not successful trials with something with the computer, they recommend in person on the road eval w Driving specialist   Dr Mayford Knife to still see patient    meds on duloxetiene - noticed a difference - in reduction  of nerve pain 90 mg /d  Again he is conflicting in the wording-he said earlier he was ok with the pain then he said he was not- to decrease level of pain - currently at 90 mg  at am , he does not think it makes him dizzy      Stomach issue - diarrhea daily - not black , 1 x per day  Liquid -but this comes on after being plugged  up - comes out hard daily then  liquid x 5 months now , has take peptobisom0l , tums antidiarrhea pills - may be doing this daily     Dad had colon ca -  Dr Maren Reamer had colon ca , had colonoscopy feb 2017 per patient report    Taking Vit d     Office Visit on 01/02/16   1. CBC, DIFF   Result Value Ref Range    WBC 6.83 4.3 - 10.0 10*3/uL  RBC 4.28 (L) 4.40 - 5.60 10*6/uL    Hemoglobin 14.2 13.0 - 18.0 g/dL    Hematocrit 43 38 - 50 %    MCV 101 (H) 81 - 98 fL    MCH 33.2 27.3 - 33.6 pg    MCHC 32.9 32.2 - 36.5 g/dL    Platelet Count 161 096 - 400 10*3/uL    RDW-CV 12.0 11.6 - 14.4 %    % Neutrophils 54 %    % Lymphocytes 31 %    % Monocytes 11 %    % Eosinophils 3 %    % Basophils 1 %    % Immature Granulocytes 0 %    Neutrophils 3.68 1.80 - 7.00 10*3/uL    Absolute Lymphocyte Count 2.10 1.00 - 4.80 10*3/uL    Monocytes 0.76 0.00 - 0.80 10*3/uL    Absolute Eosinophil Count 0.22 0.00 - 0.50 10*3/uL    Basophils 0.05 0.00 - 0.20 10*3/uL    Immature Granulocytes 0.02 0.00 - 0.05 10*3/uL    Nucleated RBC 0.00 0.00 10*3/uL    % Nucleated RBC 0 %   2. COMPREHENSIVE METABOLIC PANEL   Result Value Ref Range    Sodium 138 135 - 145 meq/L    Potassium 4.5 3.6 - 5.2 meq/L    Chloride 104 98 - 108 meq/L    Carbon Dioxide, Total 25 22 - 32 meq/L    Anion Gap 9 4 - 12    Glucose 99 62 - 125 mg/dL    Urea Nitrogen 22 (H) 8 - 21 mg/dL    Creatinine 0.45 4.09 - 1.18 mg/dL    Protein (Total) 7.0 6.0 - 8.2 g/dL    Albumin 4.3 3.5 - 5.2 g/dL    Bilirubin (Total) 0.3 0.2 - 1.3 mg/dL    Calcium 9.4 8.9 - 81.1 mg/dL    AST (GOT) 19 9 - 38 U/L    Alkaline Phosphatase (Total) 81 37 - 159 U/L    ALT (GPT) 21 10 - 48 U/L    GFR, Calc, European American >60 mL/min/[1.73_m2]    GFR, Calc, African American >60 mL/min/[1.73_m2]    GFR, Information       Calculated GFR in mL/min/1.73 m2 by MDRD  equation.  Inaccurate with changing renal function.  See http://depts.[email or web address].it.html   3. VITAMIN D (25 HYDROXY)   Result Value Ref Range    Vitamin D2 (25_Hydroxy) 1.1 ng/mL    Vitamin D3 (25_Hydroxy) 16.4 ng/mL    Vit D (25_Hydroxy) Total 17.5 (L) 20.1 - 50.0 ng/mL    Vit D (25_Hydroxy) Interp Deficient:         8.0-20.0 ng/mL    4. SED RATE   Result Value Ref Range    Erythrocyte Sedimentation Rate 15 0 - 15 mm/h   5. CRP, HIGH SENSITIVITY   Result Value Ref Range    C_Reactive Protein 1.8 0.0 - 10.0 mg/L   6. CREATINE KINASE TOTAL ACTIVITY   Result Value Ref Range    Creatinine Kinase Total Activity 118 62 - 325 U/L   7. THYROID STIMULATING HORMONE   Result Value Ref Range    Thyroid Stimulating Hormone 1.127 0.400 - 5.000 u[IU]/mL   8. T4, FREE   Result Value Ref Range    Thyroxine (Free) 0.8 0.6 - 1.2 ng/dL         MEDICATION PRIOR TO VISIT  Reports   Outpatient Medications Prior to Visit   Medication Sig Dispense Refill   . Acetaminophen 500  MG Oral Tab 2 tablets bid     . AmLODIPine Besylate 2.5 MG Oral Tab 1 tablet bid dose increase 01/30/2016 180 tablet 3   . Atorvastatin Calcium 10 MG Oral Tab Take 1 tablet (10 mg) by mouth daily. 90 tablet 1   . Cholecalciferol (VITAMIN D3) 2000 units Oral Cap Take 1 capsule (2,000 Units) by mouth daily. For low level 1 capsule 1   . Clobetasol Propionate 0.05 % External Ointment Small amount to rash as needed bid for 1-2 weeks at a time 15 g 1   . Cyanocobalamin 1000 MCG Oral Tab one per day over-the-counter started 5/21 /2012 this level borderline low 1 Tab 1   . Diclofenac Sodium (VOLTAREN) 1 % Transdermal Gel Apply 4 g topically 4 times a day. Apply to neck, trapezius muscle, and low back. No more than 4 g applied at a time. 1 Tube 5   . DULoxetine HCl 30 MG Oral CAPSULE ENTERIC COATED PARTICLES Take 1 capsule (30 mg) by mouth daily. 90 capsule 1   . DULoxetine HCl 60 MG Oral CAPSULE ENTERIC COATED PARTICLES Take 1 capsule (60 mg) by mouth  daily. 90 capsule 1   . EPINEPHrine 0.3 MG/0.3ML Injection Solution Auto-injector Inject as instructed per patient package insert, 0.3 mg intramuscularly or subcutaneously into the thigh, if needed to treat anaphylaxis 3 each 1   . Glucosamine-Chondroitin (GLUCOSAMINE CHONDR COMPLEX OR)      . Lidocaine 5 % External Patch Apply 1 patch onto the skin daily. Apply to painful area for up to 12 hours in a 24 hour period. 30 patch 10   . Lisinopril 20 MG Oral Tab Take 1 tablet (20 mg) by mouth every 12 hours. 180 tablet 1   . Primidone 50 MG Oral Tab Take 1 tablet (50 mg) by mouth 2 times a day. 180 tablet 0   . THIAMINE HCL OR      . TURMERIC OR        No facility-administered medications prior to visit.          REVIEW OF SYSTEMS - are otherwise negative unless as noted in HPI above or as additional issues below    Constitutional:     Eyes:    Head ears, nose mouth throat:     Cardiovascular:   Respiratory:     Gastrointestinal:   Genitourinary:      Musculoskeletal:  Pain ?   Integumentary:     Neurological:   Psychiatric/Behavioral:       Endocrine:     Hematological:   Allergic/Immunologic:        PHYSICAL EXAM  BP 138/89   Pulse 86   Ht 6' 2.33" (1.888 m)   Wt (!) 225 lb 6.4 oz (102.2 kg)   SpO2 96%   BMI 28.68 kg/m     Well Dressed    Constitution, In general looks:   well , good coloring  No diaphoresis    Patient is conversive    Psychiatric Mental Status / Neurologic Mental Status:  Affect:  Pleasant   Calm  Mood:  Good  Awake: Alert and attentive  Not confused    Thought::     content : no gross issue   Process : logical  Language:   Speech is fluent  Intact comprehension    Eyes: no discharge, no gross puffiness, non- icteric      Neck:    Thyroid not enlarged  2+ carotid pulses no bruits  Cardiovascular:   Heart Regular rate , rhythm, no murmur  No edema    Lymph nodes:  No gross cervical swelling  No gross axillary swelling    Respiratory:   Not tachyphpnic  Clear to ascultation  No rales, rhonchi  , without increased effort or stridor  No clubbing         Musculoskeletal:  No gross abnormal movement, twitch   No gross atrophy or deformity    Skin:  No gross lesion or rash    Neurologic:   Grossly intact  Memory:   Attention       Cranial Nerve    Eyes pupil and lid are intact   II, III, IV, VIII   Facial Movement VII: grossly intact, no facial droop      Hematologic/Lymphatic /Immunologic:  No gross bruising or rash or hives       IMPRESSION / PLAN / DISCUSSION     Navarro was seen today for follow-up .    Diagnoses and all orders for this visit:    Diarrhea, unspecified type  -     REFERRAL TO GASTROENTEROLOGY  -     C. DIFFICILE, TOXIGENIC PCR  -     STOOL LEUKOCYTE EXAM    Needs flu shot  -     influenza vaccine quadrivalent PF (adult) 0.5 mL IM - 16109    Other chronic pain    Vitamin D deficiency    Intraparenchymal hemorrhage of brain (HCC)      I think the diarrhea comes on post constipation daily   He should try not taking the antidiarrhea med and see if better, follow up w Dr Isidore Moos, fhx colon ca but he was scoped      He is keeping up with the Vit D    On pain , it is not clear to me exactly what he is saying . One hand he said he is great, on the other hand he is said he is not and wonders if there is something better than meloxicam. His wife suggested he slit the timing of tylenol /ibuprofen , he said he could not be bothered    I am worried about his choice of alcohol use    He wants me to clear him of driving and I am deferring him to [facility] , will send him note that we just got fax that he needs to do in person testing per ot- will have staff call and see were this can be done- driving rehab specialist was recommended   I also have concern with this ongoing drinking and poor judgement of this   He is referred back to Dr Charlton Haws for this question     MEDICATION as of Discharge  Current Outpatient Prescriptions   Medication Sig Dispense Refill   . Acetaminophen 500 MG Oral Tab 2 tablets bid      . AmLODIPine Besylate 2.5 MG Oral Tab 1 tablet bid dose increase 01/30/2016 180 tablet 3   . Atorvastatin Calcium 10 MG Oral Tab Take 1 tablet (10 mg) by mouth daily. 90 tablet 1   . Cholecalciferol (VITAMIN D3) 2000 units Oral Cap Take 1 capsule (2,000 Units) by mouth daily. For low level 1 capsule 1   . Clobetasol Propionate 0.05 % External Ointment Small amount to rash as needed bid for 1-2 weeks at a time 15 g 1   . Cyanocobalamin 1000 MCG Oral Tab one per day over-the-counter started 5/21 /2012 this  level borderline low 1 Tab 1   . Diclofenac Sodium (VOLTAREN) 1 % Transdermal Gel Apply 4 g topically 4 times a day. Apply to neck, trapezius muscle, and low back. No more than 4 g applied at a time. 1 Tube 5   . DULoxetine HCl 30 MG Oral CAPSULE ENTERIC COATED PARTICLES Take 1 capsule (30 mg) by mouth daily. 90 capsule 1   . DULoxetine HCl 60 MG Oral CAPSULE ENTERIC COATED PARTICLES Take 1 capsule (60 mg) by mouth daily. 90 capsule 1   . EPINEPHrine 0.3 MG/0.3ML Injection Solution Auto-injector Inject as instructed per patient package insert, 0.3 mg intramuscularly or subcutaneously into the thigh, if needed to treat anaphylaxis 3 each 1   . Glucosamine-Chondroitin (GLUCOSAMINE CHONDR COMPLEX OR)      . Lidocaine 5 % External Patch Apply 1 patch onto the skin daily. Apply to painful area for up to 12 hours in a 24 hour period. 30 patch 10   . Lisinopril 20 MG Oral Tab Take 1 tablet (20 mg) by mouth every 12 hours. 180 tablet 1   . Primidone 50 MG Oral Tab Take 1 tablet (50 mg) by mouth 2 times a day. 180 tablet 0   . THIAMINE HCL OR      . TURMERIC OR        No current facility-administered medications for this visit.          Risk of taking medication properly and follow up discussed especially to call if problem    Patient   Family Member -   express understanding of care plan/medications    I spent a total time of 25 minutes face-to-face with the patient, of which more than 50% was spent counseling and  coordinating care as outlined in this note.    No Follow-up on file.     Cc to following MD:        ++++++++++++++++++++++++++++++++++++++++++++++++++    Patient Active Problem List    Diagnosis Date Noted   . Impaired mobility and ADLs [Z74.09] 04/08/2016   . Difficulty in walking, not elsewhere classified [R26.2] 03/12/2016   . Vitamin D deficiency [E55.9] 02/13/2016   . Balance problem [R26.89] 01/23/2016   . Chronic pain of both shoulders [M25.511, G89.29, M25.512] 01/23/2016     X ray arthritis  02/19/2016       . Pain of right hip joint [M25.551] 01/23/2016   . 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 Essential hypertension [I10] 12/16/2015   . Essential tremor [G25.0] 12/16/2015   . Hx of spontan intraparenchymal intracran bleed assoc with hypertension [Z86.79] 12/16/2015     [place] 4/29- 12/10/2015  Non-traumatic intraparenchymal hemorrhage, hypertensive of the left thalamus   Traumatic hemorrhage   -- falcine subdural hemorrhage, small cortical subarachnoid     . Alcohol abuse [F10.10] 12/16/2015   . 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 History of alcohol dependence (HCC) [F10.21] 06/26/2015   . 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 Idiopathic peripheral neuropathy [G60.9] 03/15/2015   . 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 Tremor [R25.1] 04/20/2013   . Lumbosacral radiculopathy at S1 [M54.17] 03/22/2013     Left     .  Fall [W19.XXXA] 02/23/2013     BP < 100  Occasioanal tri[p and leg weakness     . Back pain, lumbosacral [M54.5] 02/15/2013   . Lumbar radiculopathy, chronic [M54.16] 02/15/2013   . Neck pain [M54.2]  02/15/2013   . Chronic pain [G89.29] 12/21/2012     Now at [place], DO, Santiago Glad    Neck pain -burn c3-7 on left side per patient  Some trigger injection    Lumbar pain-since 80's better with exercise     . 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 Numbness and tingling of leg [R20.0, R20.2] 12/21/2012     Left calf -left foot long term suspect from back-suspect L 45     . Chronic back pain [M54.9, G89.29] 12/21/2012     Mri in mid scape 1/214  Multilevel degenerative disc disease of the lumbar spine. Circumferential disc bulge at all levels below and including L1-2. No significant central spinal canal stenosis or neuroforaminal narrowing.  2. Mild retrolisthesis of L5 on S1.           . B12 deficiency [E53.8] 12/21/2012     Borderline low 5 /21/2014     . Chronic renal insufficiency, stage III (moderate) [N18.3] 12/21/2012     5/ 2013     . Hyperlipidemia [E78.5] 09/26/2012   . Cervicalgia [M54.2] 03/11/2010     Radiofrequency ablation of c3 to c 7     . Syncope [R55]    . Hypotension [I95.9]    . 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 URI (upper respiratory infection) [J06.9]          Past Medical History:   Diagnosis Date   . Carotid Sinus Hypersensitivity    . HYPERTENSION     . HYPOTENSION     . Intraparenchymal hemorrhage of brain (HCC) 2017   . Pain of right hip joint 01/23/2016   . 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 Syncope    . 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(HCC)    . URI (upper respiratory infection)        Social History     Social History   . Marital status: Married     Spouse name: N/A   . Number of children: N/A   . Years of education: N/A     Occupational History   . Not on file.     Social History Main Topics   . Smoking status: Never Smoker   . Smokeless tobacco: Former Neurosurgeon   . Alcohol use No   . Drug use: No   . Sexual activity: Not on file     Other Topics Concern   . Not on file     Social History Narrative    ** Merged  History Encounter **              Family History   Problem Relation Age of Onset   . Colon Cancer Father    . Heart (other) Mother      MI   . Other Family Hx Other      myelodysplasia       Social History     Social History   . Marital status: Married     Spouse name: N/A   . Number of children: N/A   . Years of education: N/A     Social History Main Topics   . Smoking status: Never Smoker   .  Smokeless tobacco: Former Neurosurgeon   . Alcohol use No   . Drug use: No   . Sexual activity: Not on file     Other Topics Concern   . Not on file     Social History Narrative    ** Merged History Encounter **            Health Maintenance   Topic Date Due   . Diabetes Screen  01/02/2019   . Cholesterol Test  09/11/2020   . Colonoscopy  09/24/2020   . Tetanus Vaccine  12/22/2022   . Zoster Vaccine  Completed   . Influenza Vaccine  Completed   . Hepatitis C Screen  Addressed   . HIV Screen  Addressed

## 2016-05-11 NOTE — Telephone Encounter (Signed)
(  TEXTING IS AN OPTION FOR [facility] CLINICS ONLY)  Is this a [facility] clinic? No      RETURN CALL: Detailed message on voicemail only      SUBJECT:  Results Request     TEST ORDERED BY: Dr Mayford KnifeWilliams   TYPE OF TEST? Other driving test   LOCATION OF TEST: [facility]   DATE OF TEST: 05/07/16  ADDITIONAL INFORMATION: Pt asking for results.  [facility]vised by [facility] that results went to both PCP and Dr Mayford KnifeWilliams, pt called PCP and they do not have the results.

## 2016-05-12 NOTE — Telephone Encounter (Signed)
Please call driving ot  Find out who they want him to see for in person diving rehab specialist

## 2016-05-13 ENCOUNTER — Ambulatory Visit (HOSPITAL_BASED_OUTPATIENT_CLINIC_OR_DEPARTMENT_OTHER): Payer: PPO | Attending: Anesthesiology | Admitting: Anesthesiology

## 2016-05-13 DIAGNOSIS — G8929 Other chronic pain: Secondary | ICD-10-CM

## 2016-05-13 DIAGNOSIS — M47816 Spondylosis without myelopathy or radiculopathy, lumbar region: Secondary | ICD-10-CM | POA: Insufficient documentation

## 2016-05-13 NOTE — Telephone Encounter (Signed)
Called [facility] Ot specialist left message for her to call back and let us know if they can do driving rehab there.

## 2016-05-13 NOTE — Progress Notes (Signed)
Right L4, L5, S1 RF ablation    PREPROCEDURE DIAGNOSIS:   Lumbar facet joint arthropathy.   POSTPROCEDURE DIAGNOSIS:   same   PROCEDURE PERFORMED:   Right L4, L5, S1 RFA  OPERATOR:   Samule Ohmebecca Dale, D.O.   ANESTHESIA:   local   ESTIMATED BLOOD LOSS:   Less than 1 mL.   INDICATIONS FOR THE PROCEDURE:   Briefly Ronnie Mason is a [age] yom with lumbar facet arthropathy. He responded well to two MBB (04/22/16 and 05/08/16) at these levels with a >70% pain relief response and so today we are proceeding with RF ablation at the same levels.  Ronnie Mason underwent a left sided L4, 5, S1 RFA on 02/19/16 lasting results.     DESCRIPTION OF PROCEDURE:   After  obtaining written informed consent, the patient was transported to the fluoroscopy suite where he was laid prone on the fluoroscopy suite table. Pulse oximetry was monitored throughout the procedure.  The lower back was prepped and draped in the usual sterile fashion with Betadine x3. AP fluoroscopic view of the lumbar spine was obtained and the right S1 SAP was identified. The fluoroscope was then tilted 15 degrees caudally and a  L4 and L5 facet joints were identified. The fluoroscope was then tilted 15 degrees caudally and a small amount of lidocaine 1% was injected into the point of needle entry. A  20-gauge RF needle with a 10mm active tip was inserted and advanced under fluoroscopic guidance until the target was reached. A test 2Hz , 2.0V current was run with no motor stimulation. 2 cc of .25% bupi with 2% lido was injected and a lesion was made at 90 degrees for 90 seconds. The needle was then rotated, and and another lesion made.    The L5 level were then identified, and the fluoroscope was tiled 15 degrees caudally and 15 degrees obliquely. A small amount of lidocaine 1% was injected into the point of needle entry. Under direct fluoroscopic guidance a 20-gauge RF needle with a 10mm active tip was directed to the base of the TP and SAP of right L5. Once the tips of the  needles were on oss, the RF stylette was then placed and test stimulation demostrated multifidi stimulation at 2 Hz, 2.0 V with no other motor stimulation. 1 cc of a mixture of 0.25% bupi and 4% lidocaine was injected into the needle. A 90 second lesion was made at 90 degrees C. The needle was then rotated from lateral to medial and another 90 sec 80 degree lesion was made. The same procedure was repeated at the L4 level. The needles were withdrawn and the patient returned to the recovery room in satisfactory condition.   Fluoroscopy time 34 seconds     Of note, Ronnie Mason's BP was 141/110. [facility] I encouraged him to retake his BP at home, and if elevated, contact his cardiologist

## 2016-05-14 ENCOUNTER — Telehealth (HOSPITAL_BASED_OUTPATIENT_CLINIC_OR_DEPARTMENT_OTHER): Payer: Self-pay | Admitting: Neurological Surgery

## 2016-05-14 ENCOUNTER — Telehealth (HOSPITAL_BASED_OUTPATIENT_CLINIC_OR_DEPARTMENT_OTHER): Payer: Self-pay | Admitting: Neurology

## 2016-05-14 NOTE — Telephone Encounter (Signed)
Patient called wanting to confirm [facility]. I let him know that the paperwork has been rec'd and I have forwarded a message to Dr. Mayford KnifeWilliams for review. Provided my direct contact number for patient to call back with questions.

## 2016-05-14 NOTE — Telephone Encounter (Signed)
Please call them again   Who does ot suggest he see for in person driving rehab ?

## 2016-05-14 NOTE — Telephone Encounter (Addendum)
Received a voicemail from Sydnee Cabalavid Kratzke requesting me to contact him regarding Dr. Chrissie NoaWilliam. Left patient a message I would be in office until 5:00pm or he can contact me tomorrow.     Onalee HuaDavid returned call indicated he spoke with someone early today, but could not remember the name. Onalee HuaDavid wanted to verify if Dr. Mayford KnifeWilliams received the test [facility] to review. Jaclyn PrimeAdvised Holl[place] I will have Adela LankJacqueline check and follow up with him.

## 2016-05-19 ENCOUNTER — Telehealth (HOSPITAL_BASED_OUTPATIENT_CLINIC_OR_DEPARTMENT_OTHER): Payer: Self-pay | Admitting: Neurology

## 2016-05-19 ENCOUNTER — Telehealth (HOSPITAL_BASED_OUTPATIENT_CLINIC_OR_DEPARTMENT_OTHER): Payer: Self-pay

## 2016-05-19 NOTE — Telephone Encounter (Signed)
[facility], she states that she has talk to the pt about this. Corrie DandyMary told pt that [facility] but they do not take insurance. Corrie DandyMary also told the pt that [facility] needs [facility]  about driving rehab suggestions.

## 2016-05-19 NOTE — Telephone Encounter (Signed)
Called and left a message for Ronnie Mason to call back.

## 2016-05-19 NOTE — Telephone Encounter (Signed)
Ronnie Mason called to verify if Dr. Mayford KnifeWilliams reviewed his records. Patient would like to start driving. Adv the patient I will follow up with Ronnie Mason.

## 2016-05-19 NOTE — Telephone Encounter (Addendum)
Pre-procedure phone call completed.      Date of Procedure: 10/18  Planned Procedure:  1300  Provider:  Samule Ohmebecca Dale  Reviewed Arrival Time of:  1230  Need Intepreter:  No  Are there any changes to your medication list since your last office visit?    Outpatient Medications Prior to Visit   Medication Sig Dispense Refill   . Acetaminophen 500 MG Oral Tab 2 tablets bid     . AmLODIPine Besylate 2.5 MG Oral Tab 1 tablet bid dose increase 01/30/2016 180 tablet 3   . Atorvastatin Calcium 10 MG Oral Tab Take 1 tablet (10 mg) by mouth daily. 90 tablet 1   . Cholecalciferol (VITAMIN D3) 2000 units Oral Cap Take 1 capsule (2,000 Units) by mouth daily. For low level 1 capsule 1   . Clobetasol Propionate 0.05 % External Ointment Small amount to rash as needed bid for 1-2 weeks at a time 15 g 1   . Cyanocobalamin 1000 MCG Oral Tab one per day over-the-counter started 5/21 /2012 this level borderline low 1 Tab 1   . Diclofenac Sodium (VOLTAREN) 1 % Transdermal Gel Apply 4 g topically 4 times a day. Apply to neck, trapezius muscle, and low back. No more than 4 g applied at a time. 1 Tube 5   . DULoxetine HCl 30 MG Oral CAPSULE ENTERIC COATED PARTICLES Take 1 capsule (30 mg) by mouth daily. 90 capsule 1   . DULoxetine HCl 60 MG Oral CAPSULE ENTERIC COATED PARTICLES Take 1 capsule (60 mg) by mouth daily. 90 capsule 1   . EPINEPHrine 0.3 MG/0.3ML Injection Solution Auto-injector Inject as instructed per patient package insert, 0.3 mg intramuscularly or subcutaneously into the thigh, if needed to treat anaphylaxis 3 each 1   . Glucosamine-Chondroitin (GLUCOSAMINE CHONDR COMPLEX OR)      . Lidocaine 5 % External Patch Apply 1 patch onto the skin daily. Apply to painful area for up to 12 hours in a 24 hour period. 30 patch 10   . Lisinopril 20 MG Oral Tab Take 1 tablet (20 mg) by mouth every 12 hours. 180 tablet 1   . Primidone 50 MG Oral Tab Take 1 tablet (50 mg) by mouth 2 times a day. 180 tablet 0   . THIAMINE HCL OR      .  TURMERIC OR        No facility-administered medications prior to visit.        Do you have a history of blood disease or blood clotting problems?  No  Do you take any type of blood thinners (aspirin, clopidogrel, warfarin, enoxaparin, dabigatroban, rivaroxiban)?  No              If yes, list drug and when last taken:              Has your primary care MD or cardiologist been consulted about stopping?    Do you have Diabetes?  No              If yes, please check your blood sugar the morning of the procedure and call our office if it is greater than 200. **PLEASE BRING YOUR INSULIN WITH YOU**  Do you have or think you may have any type of infection?  No  Do you have any cold or flu symptoms, fever, or not feeling well?  No              If yes, for how long?  Allergies: Do  you have any new allergies since your last office visit?  No              Are you allergic to Latex?               Are you allergic to contrast?   Are there any other issues or concerns we should be aware of?      Patient verbalized understanding and states no further questions.

## 2016-05-19 NOTE — Telephone Encounter (Signed)
Called and talked to patient after review of the Occupational Therapy Initial Examination Report re: [facility]ford Kn[facility]. Dr. Mayford KnifeWilliams has said that patient is okay to resume driving. Patient would like a copy a hard copy and is going to pick the hard copy up tomorrow 05/20/16.

## 2016-05-20 ENCOUNTER — Telehealth (HOSPITAL_BASED_OUTPATIENT_CLINIC_OR_DEPARTMENT_OTHER): Payer: Self-pay | Admitting: Anesthesiology

## 2016-05-20 ENCOUNTER — Ambulatory Visit (HOSPITAL_BASED_OUTPATIENT_CLINIC_OR_DEPARTMENT_OTHER): Payer: PPO | Attending: Anesthesiology | Admitting: Anesthesiology

## 2016-05-20 VITALS — BP 141/85 | HR 101 | Temp 97.7°F | Resp 16

## 2016-05-20 DIAGNOSIS — M25511 Pain in right shoulder: Secondary | ICD-10-CM

## 2016-05-20 DIAGNOSIS — G8929 Other chronic pain: Secondary | ICD-10-CM

## 2016-05-20 NOTE — Progress Notes (Signed)
PROCEDURE     Intra-articular shoulder injection. Side: bilateral.    PRE-PROCEDURE DIAGNOSIS  (M25.511,  G89.29,  M25.512) Chronic pain of both shoulders  (primary encounter diagnosis).    INDICATION FOR INJECTION  Shoulder pain of suspected origin from the glenohumeral joint.    ATTENDING PHYSICIAN     Velta Addison, DO    FELLOW / RESIDENT  Ludell Zacarias, DO    NPO STATUS  No.    CONSCIOUS SEDATION     None.    IV LINE  None.    MONITORING  Pulse oximetry and non-invasive blood pressure.    TIME-OUT  Performed.    DESCRIPTION OF PROCEDURE     The patient was taken to the procedure room and placed in a seated position.  His shoulder was cleaned with ChloraPrep and draped. Ultrasound probe was dressed in a sterile fashion (6-15 MHz transducer).  The transducer was placed transversally at the posterior aspect of the shoulder to visualize the glenohumeral cleft. A 25G 1.5 inch needle was inserted in-plane from medial to lateral and its tip was positioned into the joint. Depomedrol 40 mg was injected intra-articularly bilaterally.  No resistance was met during the injection.    COMPLICATIONS  NONE.    POST-PROCEDURE  Ronnie Mason was transported to the recovery area in stable condition for observation. Immediately after the procedure, he did note reduction in pain.    PLAN  The patient was informed that the steroid may take up to one week to work.  In case of insufficient improvement, the procedure can be repeated twice at intervals of minimum 7 days.    Blanchard Kelch, DO  Pain Management Fellow

## 2016-05-20 NOTE — Telephone Encounter (Signed)
(  TEXTING IS AN OPTION FOR [facility] CLINICS ONLY)  Is this a [facility] clinic? No      RETURN CALL: Detailed message on voicemail only      SUBJECT:  General Message     REASON FOR REQUEST: APPOINTMENT    MESSAGE: Patients wife would like to schedule a follow up for 05/20/16 appointment. Please call to assist. Thank you

## 2016-05-21 NOTE — Telephone Encounter (Signed)
Returned call to PrenticeLinda but she couldn't make an appointment at this time. She will return my call at a later time.

## 2016-05-22 NOTE — Progress Notes (Signed)
I saw and evaluated the patient and agree with the report above. I was present for the entire procedure and agree with the imaging interpretation

## 2016-05-25 ENCOUNTER — Telehealth (INDEPENDENT_AMBULATORY_CARE_PROVIDER_SITE_OTHER): Payer: Self-pay | Admitting: Internal Medicine

## 2016-05-25 DIAGNOSIS — M542 Cervicalgia: Secondary | ICD-10-CM

## 2016-05-25 DIAGNOSIS — R202 Paresthesia of skin: Secondary | ICD-10-CM

## 2016-05-25 DIAGNOSIS — S069X9S Unspecified intracranial injury with loss of consciousness of unspecified duration, sequela: Secondary | ICD-10-CM

## 2016-05-25 DIAGNOSIS — R200 Anesthesia of skin: Secondary | ICD-10-CM

## 2016-05-25 DIAGNOSIS — S069XAS Unspecified intracranial injury with loss of consciousness status unknown, sequela: Secondary | ICD-10-CM

## 2016-05-25 DIAGNOSIS — Z8679 Personal history of other diseases of the circulatory system: Secondary | ICD-10-CM

## 2016-05-25 NOTE — Telephone Encounter (Signed)
[facility] called. She faxed over a driver evaluation report, and Dr.Kraft had written on the plan of care wanting to know where pt could receive on-the-road training. Angie said that the company that they use is called [employer]. They have a driver rehabilitation program, which includes vehicle modification consults and driver rehabilitation. Their phone number is [phone], fax. [phone]. She said that the pt also received a brochure with this information. Angie can be reached at [phone] with any additional questions.

## 2016-06-02 ENCOUNTER — Ambulatory Visit (INDEPENDENT_AMBULATORY_CARE_PROVIDER_SITE_OTHER): Payer: PPO | Admitting: Internal Medicine

## 2016-06-02 ENCOUNTER — Ambulatory Visit: Payer: PPO | Attending: Internal Medicine

## 2016-06-02 VITALS — BP 110/76 | HR 98 | Temp 98.4°F | Ht 74.33 in | Wt 224.0 lb

## 2016-06-02 DIAGNOSIS — J40 Bronchitis, not specified as acute or chronic: Secondary | ICD-10-CM

## 2016-06-02 DIAGNOSIS — R059 Cough, unspecified: Secondary | ICD-10-CM

## 2016-06-02 DIAGNOSIS — R0602 Shortness of breath: Secondary | ICD-10-CM

## 2016-06-02 DIAGNOSIS — R05 Cough: Secondary | ICD-10-CM

## 2016-06-02 DIAGNOSIS — Z6828 Body mass index (BMI) 28.0-28.9, adult: Secondary | ICD-10-CM

## 2016-06-02 LAB — PR INFLUENZA ASSAY RAPID, ONSITE
Influenza Viral Ag-A: NEGATIVE
Influenza Viral Ag-B: NEGATIVE

## 2016-06-02 MED ORDER — FLUTICASONE-SALMETEROL 250-50 MCG/ACT IN AEPB
1.0000 | INHALATION_SPRAY | Freq: Two times a day (BID) | RESPIRATORY_TRACT | 0 refills | Status: DC
Start: 2016-06-02 — End: 2016-06-04

## 2016-06-02 MED ORDER — ALBUTEROL SULFATE HFA 108 (90 BASE) MCG/ACT IN AERS
2.0000 | INHALATION_SPRAY | Freq: Four times a day (QID) | RESPIRATORY_TRACT | 0 refills | Status: DC | PRN
Start: 2016-06-02 — End: 2017-05-19

## 2016-06-02 MED ORDER — AZITHROMYCIN 250 MG OR TABS
ORAL_TABLET | ORAL | 0 refills | Status: DC
Start: 2016-06-02 — End: 2016-06-09
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
## 2016-06-02 NOTE — Patient Instructions (Signed)
Probiotics - culturelle

## 2016-06-02 NOTE — Progress Notes (Signed)
Left lung base linear opacification - they thought scar or not well expanded  Otherwise negative  As you know flu shot negative  Hope you feel better w z pack inhalers  If not see me sooner    Otherwise see you next week

## 2016-06-04 ENCOUNTER — Encounter (INDEPENDENT_AMBULATORY_CARE_PROVIDER_SITE_OTHER): Payer: Self-pay | Admitting: Internal Medicine

## 2016-06-04 DIAGNOSIS — J40 Bronchitis, not specified as acute or chronic: Secondary | ICD-10-CM

## 2016-06-04 MED ORDER — BUDESONIDE 180 MCG/ACT IN AEPB
1.0000 | INHALATION_SPRAY | Freq: Two times a day (BID) | RESPIRATORY_TRACT | 1 refills | Status: DC
Start: 2016-06-04 — End: 2017-05-19

## 2016-06-05 ENCOUNTER — Encounter (HOSPITAL_BASED_OUTPATIENT_CLINIC_OR_DEPARTMENT_OTHER): Payer: PPO | Admitting: Physical Medicine & Rehabilitation

## 2016-06-09 ENCOUNTER — Ambulatory Visit (INDEPENDENT_AMBULATORY_CARE_PROVIDER_SITE_OTHER): Payer: PPO | Admitting: Internal Medicine

## 2016-06-09 ENCOUNTER — Encounter (INDEPENDENT_AMBULATORY_CARE_PROVIDER_SITE_OTHER): Payer: Self-pay | Admitting: Internal Medicine

## 2016-06-09 VITALS — BP 142/81 | HR 85 | Ht 74.33 in | Wt 225.6 lb

## 2016-06-09 DIAGNOSIS — R059 Cough, unspecified: Secondary | ICD-10-CM

## 2016-06-09 DIAGNOSIS — R05 Cough: Secondary | ICD-10-CM

## 2016-06-09 DIAGNOSIS — M545 Low back pain: Secondary | ICD-10-CM

## 2016-06-09 DIAGNOSIS — R2689 Other abnormalities of gait and mobility: Secondary | ICD-10-CM

## 2016-06-09 DIAGNOSIS — Z6828 Body mass index (BMI) 28.0-28.9, adult: Secondary | ICD-10-CM

## 2016-06-09 DIAGNOSIS — F1021 Alcohol dependence, in remission: Secondary | ICD-10-CM

## 2016-06-09 DIAGNOSIS — I619 Nontraumatic intracerebral hemorrhage, unspecified: Secondary | ICD-10-CM

## 2016-06-09 DIAGNOSIS — G8929 Other chronic pain: Secondary | ICD-10-CM

## 2016-06-09 NOTE — Progress Notes (Signed)
Ronnie Mason is a 65 year old male     Review of patient's allergies indicates:  Allergies   Allergen Reactions   . Bee Venom Hives, Itching and Swelling   . Gabapentin      Flu like symptoms, diarrhea, body ache upset stomach   . Lidocaine Rash     Lidocaine patch. PATIENT STATES HE IS ALLERGIC TO THE ADHESIVE NOT THE PATCH.       BP 142/81   Pulse 85   Ht 6' 2.33" (1.888 m)   Wt (!) 225 lb 9.6 oz (102.3 kg)   SpO2 98%   BMI 28.71 kg/m     Chief Complaint   Patient presents with   . Follow-Up      1 month follow up       HPI  History of:    pcp [name], MD   HM needed :       b12 def, h/o cognitive impairment post intraparenchymal bleed and alcholol dependence    Seen with his wife today     Today follow-up above issues:     Has been getting over the cough    Using albuterol, not feeling the steroid inhaler     When cough start last 10 min    z pack - took 4 of 6 pills as it gave him diarrhea now better     Cough , wheezing w hill , minimal production    Runny nose gone    Fever gone   No diarrhea     Driving - he said Dr Mayford Knife cleared him    Shoulder x ray - has copy feeling better rom better     Taking bp med   Not drinking        Office Visit on 06/02/16   1. INFLUENZA ASSAY RAPID, ONSITE   Result Value Ref Range    Influenza Viral Ag-A Negative     Influenza Viral Ag-B Negative          MEDICATION PRIOR TO VISIT  Reports   Outpatient Medications Prior to Visit   Medication Sig Dispense Refill   . Acetaminophen 500 MG Oral Tab 2 tablets bid     . Albuterol Sulfate HFA (VENTOLIN HFA) 108 (90 Base) MCG/ACT Inhalation Aero Soln Inhale 2 puffs by mouth every 6 hours as needed for shortness of breath/wheezing. 1 Inhaler 0   . AmLODIPine Besylate 2.5 MG Oral Tab 1 tablet bid dose increase 01/30/2016 180 tablet 3   . Atorvastatin Calcium 10 MG Oral Tab Take 1 tablet (10 mg) by mouth daily. 90 tablet 1   . Azithromycin 250 MG Oral Tab 2 po on day one and one per day for 4 more  days (Patient not taking: Reported on 06/09/2016) 6 tablet 0   . Budesonide 180 MCG/ACT Inhalation AEROSOL POWDER, BREATH ACTIVATED Inhale 1 puff by mouth every 12 hours. As needed 1 Inhaler 1   . Cholecalciferol (VITAMIN D3) 2000 units Oral Cap Take 1 capsule (2,000 Units) by mouth daily. For low level 1 capsule 1   . Clobetasol Propionate 0.05 % External Ointment Small amount to rash as needed bid for 1-2 weeks at a time 15 g 1   . Cyanocobalamin 1000 MCG Oral Tab one per day over-the-counter started 5/21 /2012 this level borderline low 1 Tab 1   . Diclofenac Sodium (VOLTAREN) 1 % Transdermal Gel Apply 4 g topically 4 times a day. Apply to neck, trapezius muscle,  and low back. No more than 4 g applied at a time. 1 Tube 5   . DULoxetine HCl 30 MG Oral CAPSULE ENTERIC COATED PARTICLES Take 1 capsule (30 mg) by mouth daily. 90 capsule 1   . DULoxetine HCl 60 MG Oral CAPSULE ENTERIC COATED PARTICLES Take 1 capsule (60 mg) by mouth daily. 90 capsule 1   . EPINEPHrine 0.3 MG/0.3ML Injection Solution Auto-injector Inject as instructed per patient package insert, 0.3 mg intramuscularly or subcutaneously into the thigh, if needed to treat anaphylaxis 3 each 1   . Glucosamine-Chondroitin (GLUCOSAMINE CHONDR COMPLEX OR)      . Lidocaine 5 % External Patch Apply 1 patch onto the skin daily. Apply to painful area for up to 12 hours in a 24 hour period. 30 patch 10   . Lisinopril 20 MG Oral Tab Take 1 tablet (20 mg) by mouth every 12 hours. 180 tablet 1   . Primidone 50 MG Oral Tab Take 1 tablet (50 mg) by mouth 2 times a day. 180 tablet 0   . THIAMINE HCL OR      . TURMERIC OR        No facility-administered medications prior to visit.          REVIEW OF SYSTEMS - are otherwise negative unless as noted in HPI above or as additional issues below    Constitutional:     Eyes:    Head ears, nose mouth throat:     Cardiovascular:   Respiratory:     Gastrointestinal:   Genitourinary:      Musculoskeletal:    Integumentary:      Neurological:   Psychiatric/Behavioral:       Endocrine:     Hematological:   Allergic/Immunologic:        PHYSICAL EXAM  BP 142/81   Pulse 85   Ht 6' 2.33" (1.888 m)   Wt (!) 225 lb 9.6 oz (102.3 kg)   SpO2 98%   BMI 28.71 kg/m     Well Dressed    Constitution, In general looks:   well , good coloring  No diaphoresis  Patient is Alert  & Attentive  Patient is conversive    Psychiatric Mental Status / Neurologic Mental Status:  Affect:  Pleasant   Calm  Mood:  Good  Awake: Alert and attentive  Not confused    Thought::     content : no gross issue   Process : logical  Language:   Speech is fluent  Intact comprehension    Eyes: no discharge, no gross puffiness, non- icteric  Ears: clear  Nose: clear  Mouth & Throat: no lesions or exudate    Neck:    Thyroid not enlarged  2+ carotid pulses no bruits    Cardiovascular:   Heart Regular rate , rhythm, no murmur  No edema    Lymph nodes:  No gross cervical swelling  No gross axillary swelling    Respiratory:   Not tachyphpnic  Clear to ascultation  No rales, rhonchi , without increased effort or stridor  No clubbing     Abdomen / Gastrointestinal:  Soft. No masses. No guarding. No rebound  No gross bulge or hernia. Abdomen, groin  No bruit or pulsatile mass    Genitourinary:  No suprapubic tenderness  No increased flank pain with percussion    Musculoskeletal:  No gross abnormal movement, twitch   No gross atrophy or deformity    Skin:  No gross lesion or rash  Neurologic:   Grossly intact  Memory:   Attention       Cranial Nerve    Eyes pupil and lid are intact   II, III, IV, VIII   Facial Movement VII: grossly intact, no facial droop     Motor- Gait-coordination: get up and go normal, no gross weakness   Gait- base normal appropriate for age   Get up and go -no problem       Baseline unsteady gait but not lateralizing    rom shoulder intact     Hematologic/Lymphatic /Immunologic:  No gross bruising or rash or hives       IMPRESSION / PLAN /  DISCUSSION     Ronnie Mason was seen today for follow-up .    Diagnoses and all orders for this visit:    Cough    Balance problem    Chronic right-sided low back pain, with sciatica presence unspecified    History of alcohol dependence (HCC)    Intraparenchymal hemorrhage of brain (HCC)      Cough ,he did not do in clinic at all  Continue inhaler    chronic back pain and sciatica  Now driving   His mental status is more aware and does seem clearer     Off alcohol  Pain is better w the pt and injections - working w Dr [name]     MEDICATION as of Discharge  Current Outpatient Prescriptions   Medication Sig Dispense Refill   . Acetaminophen 500 MG Oral Tab 2 tablets bid     . Albuterol Sulfate HFA (VENTOLIN HFA) 108 (90 Base) MCG/ACT Inhalation Aero Soln Inhale 2 puffs by mouth every 6 hours as needed for shortness of breath/wheezing. 1 Inhaler 0   . AmLODIPine Besylate 2.5 MG Oral Tab 1 tablet bid dose increase 01/30/2016 180 tablet 3   . Atorvastatin Calcium 10 MG Oral Tab Take 1 tablet (10 mg) by mouth daily. 90 tablet 1   . Budesonide 180 MCG/ACT Inhalation AEROSOL POWDER, BREATH ACTIVATED Inhale 1 puff by mouth every 12 hours. As needed 1 Inhaler 1   . Cholecalciferol (VITAMIN D3) 2000 units Oral Cap Take 1 capsule (2,000 Units) by mouth daily. For low level 1 capsule 1   . Clobetasol Propionate 0.05 % External Ointment Small amount to rash as needed bid for 1-2 weeks at a time 15 g 1   . Cyanocobalamin 1000 MCG Oral Tab one per day over-the-counter started 5/21 /2012 this level borderline low 1 Tab 1   . Diclofenac Sodium (VOLTAREN) 1 % Transdermal Gel Apply 4 g topically 4 times a day. Apply to neck, trapezius muscle, and low back. No more than 4 g applied at a time. 1 Tube 5   . DULoxetine HCl 30 MG Oral CAPSULE ENTERIC COATED PARTICLES Take 1 capsule (30 mg) by mouth daily. 90 capsule 1   . DULoxetine HCl 60 MG Oral CAPSULE ENTERIC COATED PARTICLES Take 1 capsule (60 mg) by mouth daily. 90 capsule 1   . EPINEPHrine 0.3  MG/0.3ML Injection Solution Auto-injector Inject as instructed per patient package insert, 0.3 mg intramuscularly or subcutaneously into the thigh, if needed to treat anaphylaxis 3 each 1   . Glucosamine-Chondroitin (GLUCOSAMINE CHONDR COMPLEX OR)      . Lidocaine 5 % External Patch Apply 1 patch onto the skin daily. Apply to painful area for up to 12 hours in a 24 hour period. 30 patch 10   . Lisinopril 20 MG Oral Tab Take  1 tablet (20 mg) by mouth every 12 hours. 180 tablet 1   . Primidone 50 MG Oral Tab Take 1 tablet (50 mg) by mouth 2 times a day. 180 tablet 0   . THIAMINE HCL OR      . TURMERIC OR        No current facility-administered medications for this visit.          Risk of taking medication properly and follow up discussed especially to call if problem    Patient   Family Member -   express understanding of care plan/medications    I spent a total time of 25 minutes face-to-face with the patient, of which more than 50% was spent counseling and coordinating care as outlined in this note.    No Follow-up on file.     Cc to following MD:        ++++++++++++++++++++++++++++++++++++++++++++++++++    Patient Active Problem List    Diagnosis Date Noted   . Impaired mobility and ADLs [Z74.09] 04/08/2016   . Difficulty in walking, not elsewhere classified [R26.2] 03/12/2016   . Vitamin D deficiency [E55.9] 02/13/2016   . Balance problem [R26.89] 01/23/2016   . Chronic pain of both shoulders [M25.511, G89.29, M25.512] 01/23/2016     X ray arthritis  02/19/2016       . Pain of right hip joint [M25.551] 01/23/2016   . 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 Essential hypertension [I10] 12/16/2015   . Essential tremor [G25.0] 12/16/2015   . Hx of spontan intraparenchymal intracran bleed assoc with hypertension  [Z86.79] 12/16/2015     [place] 4/29- 12/10/2015  Non-traumatic intraparenchymal hemorrhage, hypertensive of the left thalamus   Traumatic hemorrhage   -- falcine subdural hemorrhage, small cortical subarachnoid     . Alcohol abuse [F10.10] 12/16/2015   . 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 History of alcohol dependence (HCC) [F10.21] 06/26/2015   . 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 Idiopathic peripheral neuropathy [G60.9] 03/15/2015   . 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 Tremor [R25.1] 04/20/2013   . Lumbosacral radiculopathy at S1 [M54.17] 03/22/2013     Left     . Fall [W19.XXXA] 02/23/2013     BP < 100  Occasioanal tri[p and leg weakness     . Back pain, lumbosacral [M54.5] 02/15/2013   . Lumbar radiculopathy, chronic [M54.16] 02/15/2013   . Neck pain [M54.2] 02/15/2013   . Chronic pain [G89.29] 12/21/2012     Now at [place], DO, Santiago Glad    Neck pain -burn c3-7 on left side per patient  Some trigger injection    Lumbar pain-since 80's better with exercise     . 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 Numbness and tingling of leg [R20.0, R20.2] 12/21/2012     Left calf -left foot long term suspect from back-suspect L 45     . Chronic back pain [M54.9, G89.29] 12/21/2012     Mri in mid scape 1/214  Multilevel degenerative disc disease of the lumbar spine. Circumferential disc bulge at all levels below and including L1-2. No significant central spinal canal stenosis  or neuroforaminal narrowing.  2. Mild retrolisthesis of L5 on S1.           . B12 deficiency [E53.8] 12/21/2012     Borderline low 5 /21/2014     . Chronic renal insufficiency, stage III (moderate) [N18.3] 12/21/2012     5/ 2013     . Hyperlipidemia [E78.5] 09/26/2012   . Cervicalgia [M54.2] 03/11/2010     Radiofrequency  ablation of c3 to c 7     . Syncope [R55]    . Hypotension [I95.9]    . 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 URI (upper respiratory infection) [J06.9]          Past Medical History:   Diagnosis Date   . Carotid Sinus Hypersensitivity    . HYPERTENSION     . HYPOTENSION     . Intraparenchymal hemorrhage of brain (HCC) 2017   . Pain of right hip joint 01/23/2016   . 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 Syncope    . 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(HCC)    . URI (upper respiratory infection)        Social History     Social History   . Marital status: Married     Spouse name: N/A   . Number of children: N/A   . Years of education: N/A     Occupational History   . Not on file.     Social History Main Topics   . Smoking status: Never Smoker   . Smokeless tobacco: Former Neurosurgeon   . Alcohol use No   . Drug use: No   . Sexual activity: Not on file     Other Topics Concern   . Not on file     Social History Narrative    ** Merged History Encounter **              Family History     Problem (# of Occurrences) Relation (Name,Age of Onset)    Colon Cancer (1) Father    Heart (other) (1) Mother: MI    Other Family Hx (1) Other: myelodysplasia          Social History     Social History   . Marital status: Married     Spouse name: N/A   . Number of children: N/A   . Years of education: N/A     Social History Main Topics   . Smoking status: Never Smoker   . Smokeless tobacco: Former Neurosurgeon   . Alcohol use No   . Drug use: No   . Sexual activity: Not on file     Other Topics Concern   . Not on file     Social History Narrative    ** Merged History Encounter **            Health Maintenance   Topic Date Due   . Diabetes Screen  01/02/2019   . Cholesterol Test  09/11/2020   . Colonoscopy  09/24/2020   . Tetanus Vaccine  12/22/2022   . Zoster Vaccine  Completed   . Influenza Vaccine  Completed   . Hepatitis C Screen  Addressed   . HIV Screen  Addressed

## 2016-06-19 LAB — SAVE SPECIMEN FOR FUTURE TESTING: Save Specimen Number Of Days: 90

## 2016-06-24 ENCOUNTER — Ambulatory Visit (HOSPITAL_BASED_OUTPATIENT_CLINIC_OR_DEPARTMENT_OTHER): Payer: PPO | Attending: Anesthesiology | Admitting: Anesthesiology

## 2016-06-24 ENCOUNTER — Encounter (HOSPITAL_BASED_OUTPATIENT_CLINIC_OR_DEPARTMENT_OTHER): Payer: Self-pay | Admitting: Anesthesiology

## 2016-06-24 VITALS — BP 169/111 | HR 85 | Temp 98.6°F | Ht 74.33 in | Wt 227.3 lb

## 2016-06-24 DIAGNOSIS — M25551 Pain in right hip: Secondary | ICD-10-CM | POA: Insufficient documentation

## 2016-06-24 DIAGNOSIS — R1031 Right lower quadrant pain: Secondary | ICD-10-CM | POA: Insufficient documentation

## 2016-06-24 NOTE — Patient Instructions (Addendum)
1. Hip xray to evaluate for osteoarthritis     2. Bursa injection scheduled for next week    3. Try Bengay cream, TENS unit, NSAID, and PT in the mean time

## 2016-06-24 NOTE — Progress Notes (Signed)
[facility]  10/16/2015   Ronnie Mason  [id number]    REFERRED BY:   No referring provider defined for this encounter.  PCP:  Macario CarlsVara V Kraft, MD (General)  [phone] / [place] [id number]    REFERRED FOR: Evaluation of low back and neck pain  with the following specific issues to be addressed:   CHIEF COMPLAINT:   Chief Complaint   Patient presents with   . Pain Management     1 mo f/u        HISTORY OF PRESENT ILLNESS:  Ronnie Mason is a 65 year old male who I have seen for chronic pain located in lower back pain, specifically left L4 through S1.  He has since undergone bilateral L4-S1 RFAs which gave him complete relief of his back pain. Most recently she was seen by me for bilateral shoulder injection on May 20, 2016.  He had good relief from his shoulder injections however he now has pain over the lateral aspect of his right hip.  She is being seen by physical therapy for his low back and the physical therapist suggested that he may be suffering from trochanteric bursitis.  This pain has been going on for 2 weeks now and he has been taking Aleve, TENS unit, and Tylenol to help relieve the pain, however it remains severe.  The pain is so severe that in fact he is almost vomited several times.  He wonders if there is an injection that he can have that might help.  Ronnie Mason endorses taking duloxetine  90 mg qd, melixcam, APAP, diclofenac transdermal gel, and lidocaine 5% patch.      Review of Systems:  1. Constitutional: Negative   2. Cardiovascular: Negative   3. Respiratory: Negative   4. MSK: As noted in HPI  5. Neuro: Denies lower extremity weakness      PAST HISTORY:  Past Medical History:   Diagnosis Date   . Carotid Sinus Hypersensitivity    . HYPERTENSION     . HYPOTENSION     . Intraparenchymal hemorrhage of brain (HCC) 2017   . Pain of right hip joint 01/23/2016   . 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 myelopathy or radiculopathy 05/28/2014    . Syncope    . 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(HCC)    . URI (upper respiratory infection)      Past Surgical History:   Procedure Laterality Date   . ANES; COLONOSCOPY  2002    repeat in 3 years   . ANES; COLONOSCOPY & POLYPECTOMY  11/18/2007    repeat in 3 years   . UNLISTED PROCEDURE FEMUR/KNEE     . UNLISTED PROCEDURE HANDS/FINGERS     . UNLISTED PROCEDURE SPINE  2013    rfa to c3 to c 7      Family History   Problem Relation Age of Onset   . Colon Cancer Father    . Heart (other) Mother      MI   . Other Family Hx Other      myelodysplasia     Social History     Social History Narrative    ** Merged History Encounter **            Allergies: Bee venom; Gabapentin; and Lidocaine    Physical Exam:  General:  NAD, seated comfortably in chair  Respiratory:  Speaking in full sentences.   Neuro: strength 5/5 in lower extremities  in both proximal and distal muscle groups.   MSK: He walks with a cane due to lateral right hip pain, pain with palpation over the trochanteric bursa, FABER test is positive for groin pain on the right, negative for pain on the left. St. Leg raise is positive for lateral hip pain     Past medical, surgical, family, and social history as above was reviewed and updated personally with Ronnie Mason.  Medications and allergies also reviewed and confirmed personally.    Assessment/Plan:  Ronnie Mason is a 65 year old male with right-sided trochanteric bursitis.  On physical exam, he may be demonstrating signs of hip arthritis. Otherwise he is doing well in regards to his back pain and shoulder pain.  She is involved in physical therapy and progressing well.    1.  We will schedule him for a right-sided trochanteric bursa injection  2.  I ordered right-sided hip x-rays to evaluate for osteoarthritis  3.  I suggested that he use a heat or cold pack, continue taking NSAIDs, acetaminophen, and TENS unit in the meantime.

## 2016-06-26 ENCOUNTER — Ambulatory Visit: Payer: PPO | Attending: Anesthesiology

## 2016-06-26 DIAGNOSIS — R1031 Right lower quadrant pain: Secondary | ICD-10-CM | POA: Insufficient documentation

## 2016-07-01 ENCOUNTER — Encounter (HOSPITAL_BASED_OUTPATIENT_CLINIC_OR_DEPARTMENT_OTHER): Payer: PPO | Admitting: Anesthesiology

## 2016-07-01 ENCOUNTER — Ambulatory Visit: Payer: PPO | Admitting: Anesthesiology

## 2016-07-01 ENCOUNTER — Ambulatory Visit (HOSPITAL_BASED_OUTPATIENT_CLINIC_OR_DEPARTMENT_OTHER): Payer: PPO | Attending: Anesthesiology | Admitting: Anesthesiology

## 2016-07-01 DIAGNOSIS — M25551 Pain in right hip: Secondary | ICD-10-CM

## 2016-07-06 ENCOUNTER — Other Ambulatory Visit: Payer: Self-pay | Admitting: Internal Medicine

## 2016-07-06 DIAGNOSIS — G250 Essential tremor: Secondary | ICD-10-CM

## 2016-07-06 NOTE — Telephone Encounter (Signed)
The patient last received this medication at the requesting pharmacy on 04/09/16.

## 2016-07-07 MED ORDER — PRIMIDONE 50 MG OR TABS
50.0000 mg | ORAL_TABLET | Freq: Two times a day (BID) | ORAL | 0 refills | Status: DC
Start: 2016-07-07 — End: 2016-09-22

## 2016-07-09 NOTE — Progress Notes (Deleted)
STROKE CLINIC NOTE:    Subjective:     Patient Referred by: Raliegh ScarletJennifer Wax, MD  [address]  [phone]  [place], [place] [id number]    Chief Complaint: Hypertensive intraparenchymal hemorrhage    History of Present Illness:     Ronnie Mason is a 65 year old man with past medical history of hypertension, alcohol use disorder, who was admitted to [facility] November 30, 2015 and was found to have a left thalamic intraparenchymal hemorrhage thought secondary to hypertension.  Per prior records, the patient left his home to walk his dogs and was found down by a neighbor.  He had a blood alcohol level of 360 on admission.  During hospital stay he did experience alcohol withdrawal and required symptom triggered Ativan for high CIWA scores but did not experience any observed seizures.  He also suffered traumatic fractures of his left nasal bone frontal maxilla and anterior nasal spine.  No surgical intervention was recommended.  In addition he was found to have ventriculomegaly.  He was referred to Dr. Dimas MillinMichael Williamson saw him in clinic on April 02, 2016 and who recommended neuropsychological testing and occupational therapy evaluation for driving.  The patient was subsequently cleared for driving.    After hospitalization the patient was discharged on amlodipine 10 mg, atorvastatin 10 mg daily, lisinopril 20 mg every 12 hours.  He was discharged to home.    Obstructive sleep apnea screening:   -Unexplained excessive daytime sleepiness: {YES DEFAULT/NO:106416::"yes"}   Snoring: {YES DEFAULT/NO:106416::"yes"}   -Resistant hypertension: {YES DEFAULT/NO:106416::"yes"}  Cryptogenic Stroke:  {YES DEFAULT/NO:106416::"yes"}   -cancer screening: {YES DEFAULT/NO:106416::"yes"}  Outpatient BP monitoring: {YES DEFAULT/NO:106416::"yes"}     PHQ-2 (if yes to either, positive screen for depression)  1.During the past month, have you often been bothered by feeling down, depressed, or hopeless?  2.During the  past month, have you often been bothered by little interest or pleasure in doing things?     ROS: 14 point ROS performed. Pertinent positives and negatives included in HPI.     Past Medical History:  1.  Alcohol use disorder  2.  Hypertension  3.  Orthostatic hypotension  4.  Chronic back pain  5.  Essential tremor    Home Medications:  Outpatient Prescriptions as of 07/10/2016   Medication Sig Dispense Refill   . Acetaminophen 500 MG Oral Tab 2 tablets bid     . Albuterol Sulfate HFA (VENTOLIN HFA) 108 (90 Base) MCG/ACT Inhalation Aero Soln Inhale 2 puffs by mouth every 6 hours as needed for shortness of breath/wheezing. 1 Inhaler 0   . AmLODIPine Besylate 2.5 MG Oral Tab 1 tablet bid dose increase 01/30/2016 180 tablet 3   . Atorvastatin Calcium 10 MG Oral Tab Take 1 tablet (10 mg) by mouth daily. 90 tablet 1   . Budesonide 180 MCG/ACT Inhalation AEROSOL POWDER, BREATH ACTIVATED Inhale 1 puff by mouth every 12 hours. As needed 1 Inhaler 1   . Cholecalciferol (VITAMIN D3) 2000 units Oral Cap Take 1 capsule (2,000 Units) by mouth daily. For low level 1 capsule 1   . Clobetasol Propionate 0.05 % External Ointment Small amount to rash as needed bid for 1-2 weeks at a time 15 g 1   . Cyanocobalamin 1000 MCG Oral Tab one per day over-the-counter started 5/21 /2012 this level borderline low 1 Tab 1   . Diclofenac Sodium (VOLTAREN) 1 % Transdermal Gel Apply 4 g topically 4 times a day. Apply to neck,  trapezius muscle, and low back. No more than 4 g applied at a time. 1 Tube 5   . DULoxetine HCl 30 MG Oral CAPSULE ENTERIC COATED PARTICLES Take 1 capsule (30 mg) by mouth daily. 90 capsule 1   . DULoxetine HCl 60 MG Oral CAPSULE ENTERIC COATED PARTICLES Take 1 capsule (60 mg) by mouth daily. 90 capsule 1   . EPINEPHrine 0.3 MG/0.3ML Injection Solution Auto-injector Inject as instructed per patient package insert, 0.3 mg intramuscularly or subcutaneously into the thigh, if needed to treat anaphylaxis 3 each 1   .  Glucosamine-Chondroitin (GLUCOSAMINE CHONDR COMPLEX OR)      . Lidocaine 5 % External Patch Apply 1 patch onto the skin daily. Apply to painful area for up to 12 hours in a 24 hour period. 30 patch 10   . Lisinopril 20 MG Oral Tab Take 1 tablet (20 mg) by mouth every 12 hours. 180 tablet 1   . Primidone 50 MG Oral Tab Take 1 tablet (50 mg) by mouth 2 times a day. 180 tablet 0   . THIAMINE HCL OR      . TURMERIC OR        No current facility-administered medications on file as of 07/10/2016.        Allergies:  Review of patient's allergies indicates:  Allergies   Allergen Reactions   . Bee Venom Hives, Itching and Swelling   . Gabapentin      Flu like symptoms, diarrhea, body ache upset stomach   . Lidocaine Rash     Lidocaine patch. PATIENT STATES HE IS ALLERGIC TO THE ADHESIVE NOT THE PATCH.        Family History:    Social History:  Social History     Social History   . Marital status: Married     Spouse name: N/A   . Number of children: N/A   . Years of education: N/A     Occupational History   . Not on file.     Social History Main Topics   . Smoking status: Never Smoker   . Smokeless tobacco: Former Neurosurgeon   . Alcohol use No   . Drug use: No   . Sexual activity: Not on file     Other Topics Concern   . Not on file     Social History Narrative    ** Merged History Encounter **              Objective:   There were no vitals taken for this visit.    Physical Exam    Neurological Examination  General: Well appearing male in no acute distress  Mental Status: oriented to person, time, place and situation.   Cranial Nerves: good functional vision, grossly appropriate visual fields, EOMI, appropriate pupillary responses, muscles of mastication strong, face symmetrical and palate elevated symmetrically, SCM strength symmetric, facial sensation intact in bilateral V1/V2/V3 distributions   Motor:    Tone: normal     Bulk: normal     Pronator Drift: absent    Strength:5/5 in all muscle groups of the upper and lower  extremities   Reflexes: 2+/4 throughout symmetrical, Hoffman's absent, toes downgoing  Coordination: finger to nose, fine finger movements and rapid alternating movements were intact. Heel to shin normal.   Station and Gait: Normal gait, good arm swing, able to walk on toes and heels, tandem gait intact. Romberg absent.   Sensory: normal to light touch throughout.     mRS =  0 - No symptoms at all  1 - No significant disability despite symptoms; able to carry out all usual duties and activities    2 - Slight disability; unable to carry out all previous activities, but able to look after own affairs without assistance     3 - Moderate disability; requiring some help, but able to walk without assistance     4 - Moderately severe disability; unable to walk without assistance and unable to attend to own bodily needs without assistance     5 - Severe disability; bedridden, incontinent and requiring constant nursing care and attention    6 - Dead    Imaging/Laboratory review:     ***See HPI    Assessment:        Recommendations:  ***    Linus GalasSteve O'Donnell MD [email or web address]  Vascular Neurology Fellow    Follow up: ***

## 2016-07-10 ENCOUNTER — Ambulatory Visit (HOSPITAL_BASED_OUTPATIENT_CLINIC_OR_DEPARTMENT_OTHER): Payer: PPO | Attending: Neurology | Admitting: Internal Medicine

## 2016-07-10 VITALS — BP 150/109 | HR 79 | Temp 98.2°F | Ht 76.0 in | Wt 230.0 lb

## 2016-07-10 DIAGNOSIS — Z8679 Personal history of other diseases of the circulatory system: Secondary | ICD-10-CM | POA: Insufficient documentation

## 2016-07-10 DIAGNOSIS — G609 Hereditary and idiopathic neuropathy, unspecified: Secondary | ICD-10-CM | POA: Insufficient documentation

## 2016-07-10 DIAGNOSIS — I619 Nontraumatic intracerebral hemorrhage, unspecified: Secondary | ICD-10-CM

## 2016-07-10 DIAGNOSIS — Z6828 Body mass index (BMI) 28.0-28.9, adult: Secondary | ICD-10-CM

## 2016-07-10 LAB — VITAMIN B12 (COBALAMIN): Vitamin B12 (Cobalamin): 961 pg/mL — ABNORMAL HIGH (ref 180–914)

## 2016-07-10 LAB — HEMOGLOBIN A1C, HPLC: Hemoglobin A1C: 5.3 % (ref 4.0–6.0)

## 2016-07-10 MED ORDER — AMLODIPINE BESYLATE 5 MG OR TABS
5.0000 mg | ORAL_TABLET | Freq: Two times a day (BID) | ORAL | 3 refills | Status: DC
Start: 2016-07-10 — End: 2017-01-11

## 2016-07-10 NOTE — Progress Notes (Addendum)
ASSESSMENT/RECOMMENDATIONS:  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 R parafalcine SAH likely related to trauma sustained on the day of presentation. Work-up has included CTA which did not demonstrate underlying vascular malformation. MRI brain obtained in 04/02/16 obtained for hydrocephalus w/u did not demonstrate any findings concerning for underlying mass or other areas of hemorrhage, though blood sensitive (SWI/GRE) sequences were not obtained and imaging was done w/o contrast. FLAIR did show moderate amount of chronic microvascular ischemic changes.    We recommend that patient increase his amlodipine from 2.5mg  PO BID to 5mg  PO BID (can take 10mg  PO once daily instead if needed) for BP goal <130/80. He should also follow-up with Sleep Medicine for evaluation of OSA. Additionally, we discussed maintaining a healthy diet, such as the Mediterranean Diet, and regular exercise (aquatic therapy given joint pain). He should also get up from a seated position slowly and maintain adequate hydration given hx of orthostatic hypotension. Compression stockings may also be considered.  --BP goal <130/80, pt to check regularly  --Increase amlodipine from 2.5mg  PO BID to 5mg  PO BID (also can do 10mg  once a day dosing)  --Additional BP medications may be required if this increase in amlodipine dosing is not sufficient for patient to reach goal  --Sleep Medicine Referral  --Maintain healthy diet and regular exercise    #Neuropathy  Suspect this is related to hx of heavy EtOH use. B12 deficiency of consideration as well given hx of 'borderline' B12 deficiency. May also consider other common causes such as monoclonal gammopathy of unknown significance (MGUS).  Patient apparently had a thorough w/u [facility] and had EMG/NCS. However this was done, per patient, years ago and since that time patient has had worsening of symptoms. Will refer patient to our Neuromuscular colleagues vs General Neurology [facility]ident [facility] for further evaluation and consideration of repeat EMG/NCS. Patient states that he would like to see a Neurologist within our system. We will send for general peripheral neuropathy w/u labs now.  --Labs: B12, A1c, Free light chains  --referral to Neuromuscular Clinic vs General Neurology Clinic    RTC 72mo  ____________________________________________    HPI  65 year old man with past medical history of hypertension, alcohol use disorder, who was admitted to [facility] November 30, 2015 and was found to have a left thalamic intraparenchymal hemorrhage thought secondary to hypertension.  Per prior records, the patient left his home to walk his dogs and was found down by a neighbor.  He had a blood alcohol level of 360 on admission.  During hospital stay he did experience alcohol withdrawal and required symptom triggered Ativan for high CIWA scores but did not experience any observed seizures.  He also suffered traumatic fractures of his left nasal bone frontal maxilla and anterior nasal spine.  No surgical intervention was recommended.  In addition he was found to have ventriculomegaly.  He was referred to Dr. Fransisca ConnorsMichael Williams saw him in clinic on April 02, 2016 and who recommended neuropsychological testing and occupational therapy evaluation for driving.  The patient was subsequently cleared for driving.    After hospitalization the patient was discharged on amlodipine 10 mg, atorvastatin 10  mg daily, lisinopril 20 mg every 12 hours.  He was discharged to home.    Today, patient states that he is unable tor recall the specific events of his fall or details of his hospitalization (up until day 4). He is unsure if he had any focal neurologic  deficits after his fall, but his wife reports that he possibly had R sided weakness though is not sure. No weakness at discharge. No recurrence of syumptoms. No new focal neurologic deficits including changes in motor strength, sensation (other than that attributed to his neuropathy), coordination, speech, cognition.     We reviewed patient's imaging together today and discussed his BP regimen. Patient reports that he checks his BP regularly (approximately 1x per week at his doctors office during his visits) and states that his BP is usually 'normal.' However, his wife, who takes meticulous notes of his care, reports that he does not, infact, check his BP regularly and when he does it typically runs in 140-150s. Patient does follow with cardiology for his BP (reportedly for BP variability) and recently had his amlodipine does decreased and increased again. He currently is taking Amlodipine 2.5mg  PO BID and lisinopril 20mg  PO BID. Additionally he is taking atorvastatin.     Patient states that he would also like to discuss his neuropathy today. States that he has had numbness from the knee down for a few years and more recently in the last 29mo started to have numbness in his R leg. Was seeing Dr. Maryelizabeth RowanKurshner (sp?) [facility] for both his neuropathy and essential tremors. Has had EMG/NCS in the past (several years ago) but has had worsening of his symptoms after this. Reports that he had a thorough w/u and was told to take B12 supplements (oral supplements for 'borderline low level') and cut back on his drinking. Recently stopped drinking after his fall in 11/2015 and states that he may have had some worsening since that time but is not sure. He feels that his instability while walking is due to his neuropathy and likely led to his fall in 11/2015.     Medication Compliance: yes  Symptom Recurence: no  Obstructive sleep apnea screening:              -Unexplained excessive daytime sleepiness: yes              -Snoring: yes               -Denies dry mouth or morning HA  Outpatient BP monitoring: yes while at doctors visits - typically 140s    mRS = 0  0 - No symptoms at all  1 - No significant disability despite symptoms; able to carry out all usual duties and activities    2 - Slight disability; unable to carry out all previous activities, but able to look after own affairs without assistance     3 - Moderate disability; requiring some help, but able to walk without assistance     4 - Moderately severe disability; unable to walk without assistance and unable to attend to own bodily needs without assistance     5 - Severe disability; bedridden, incontinent and requiring constant nursing care and attention    6 - Dead    PHQ-2 (if yes to either, positive screen for depression)  1.During the past month, have you often been bothered by feeling down, depressed, or hopeless? No  2.During the past month, have you often been bothered by little interest or pleasure in  doing things? No    Past Medical History:  1.  Alcohol use disorder - quit in 11/2015  2.  Hypertension  3.  Orthostatic hypotension  4.  Chronic back pain  5.  Essential tremor    Medications:  .  Acetaminophen 500 MG Oral Tab, 2 tablets bid  .  Albuterol Sulfate HFA (VENTOLIN HFA) 108 (90 Base) MCG/ACT Inhalation Aero Soln, Inhale 2 puffs by mouth every 6 hours as needed for shortness of breath/wheezing  .  AmLODIPine Besylate 5 MG Oral Tab, Take 1 tablet (5 mg) by mouth 2 times a day  .  Atorvastatin Calcium 10 MG Oral Tab, Take 1 tablet (10 mg) by mouth daily  .  Budesonide 180 MCG/ACT Inhalation AEROSOL POWDER, BREATH ACTIVATED, Inhale 1 puff by mouth every 12 hours. As needed  .  Cholecalciferol (VITAMIN D3) 2000 units Oral Cap, Take 1 capsule (2,000 Units) by mouth daily.  .  Clobetasol Propionate 0.05 % External Ointment, Small amount to rash as needed bid for 1-2 weeks at a time  .  Cyanocobalamin 1000 MCG Oral Tab, one per day over-the-counter started 5/21 /2012 this level  borderline low,   .  Diclofenac Sodium (VOLTAREN) 1 % Transdermal Gel, Apply 4 g topically 4 times a day. Apply to neck, trapezius muscle, and low back. No more than 4 g applied at a time  .  DULoxetine HCl 30 MG Oral CAPSULE ENTERIC COATED PARTICLES, Take 1 capsule (30 mg) by mouth daily.  .  DULoxetine HCl 60 MG Oral CAPSULE ENTERIC COATED PARTICLES, Take 1 capsule (60 mg) by mouth daily  .  EPINEPHrine 0.3 MG/0.3ML Injection Solution Auto-injector, Inject as instructed per patient package insert, 0.3 mg intramuscularly or subcutaneously into the thigh, if needed to treat anaphylaxis,  .  Lidocaine 5 % External Patch, Apply 1 patch onto the skin daily. Apply to painful area for up to 12 hours in a 24 hour period  .  Lisinopril 20 MG Oral Tab, Take 1 tablet (20 mg) by mouth every 12 hours  .  Primidone 50 MG Oral Tab, Take 1 tablet (50 mg) by mouth 2 times a day  .  THIAMINE HCL OR:     Allergies:  Review of patient's allergies indicates:  Allergies   Allergen Reactions   . Bee Venom Hives, Itching and Swelling   . Gabapentin      Flu like symptoms, diarrhea, body ache upset stomach   . Lidocaine Rash     Lidocaine patch. PATIENT STATES HE IS ALLERGIC TO THE ADHESIVE NOT THE PATCH.     Family History:  No family hx of stroke  Mother - MI    Social History:  Residence/Occupation Facilities manager, retired  EtOH - previous heavy drinker, none currently  Tobacco - denies  Illicits - denies    Review of Systems  Complete ROS negative except as otherwise noted in HPI    Objective:  Physical Exam  BP (!) 150/109   Pulse 79   Temp 98.2 F (36.8 C) (Temporal)   Ht 6\' 4"  (1.93 m)   Wt (!) 230 lb (104.3 kg)   SpO2 99%   BMI 28.00 kg/m    Orthostatic vital signs negative  Gen: Patient seated comfortably in chair, talkative, pleasant, in NAD.  Resp: Non-labored  CV: RRR  Skin: no suspicious rahs or lesions    Neuro:  Mental status:  Awake, alert, and oriented to person, place, and time. Speech clear and fluent.  Cranial  nerves:   II: Full visual fields; Unable to visualize fundi b/l   III/IV/VI: EOMI, PERRLA 4-->57mm.  V: Facial sensation intact in V1/V2/V3 distribution b/l  VII: Facial expressions intact and symmetric.   VIII: Hearing grossly intact to conversation  IX/X: Palate raises symmetrically  XI: Strength 5/5 in shoulder elevation and neck rotation b/l.  XII: Tongue protrusion midline, normal lateral movements.  Motor exam:   Grip Wr f Wr e bic tric delt hip f leg f leg e ank d ank p  L 5 5 5 5 5 5 5 5 5 5 5    R         5          5          5          5   5      5  5         5         5    5          5       No pronator drift. Normal bulk and tone. Fine postural tremor.     No abnormal movements seen during my exam. No asterixis.   Sensation: Decreased to light touch, temperature, and pinprick from knees down bilaterally (approximately 50% difference from above the knee). R foreleg with patchy distribution of sensory change. Vibration: 5 sec in L great to and 10sec in R great toe.   DTRs:                       R    ___    L                 tr I         I_'_'_I         I tr                   I__tr___ I ___tr__I                                  I                                   I                              ___I___                         I              I                    1   I              I   0                    __ I 1        0 I__  Cerebellum: FTN and HTS intact. Romberg negative.   Gait: Mildly wide based and foot slapping.  Unable to test tandem gait given instability    Imaging:  CTA C/A/P 11/30/2015 :   "1. No traumatic injury  to the chest, abdomen or pelvis. 2. Bibasilar atelectasis. Aspirations to the left base is considered. 3. Hepatic steatosis. 4. Diverticulosis without evidence of acute diverticulitis."      [facility] 11/30/2015:   "1. 11 mm x 10mm focus of acute intraparenchymal hemorrhage in the genu of the left internal capsule. 2. Trace falcine and subarachnoid blood. 3. Fractures of the left nasal bone,  frontal process of maxilla, and anterior nasal spine with an adjacent laceration. 4. Diffuse ventriculomegaly out of proportion to the degree of sulcal enlargement. 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 Stable trace extra-axial blood. Stable ventriculomegaly."      CTA head 12/01/2015:    "Expected evolution of a 1.6cm left thalamic hematoma and right parafalcine subarachnoid hemorrhage. An underlying etiology is not apparent by CT or CTA."      XR R foot 12/04/2015:   "Mild degenerative changes about the first MTP. There are vascular calcifications present. No acute fracture or dislocation. No radiographic evidence of soft tissue swelling."     MRI brain 04/02/16:  IMPRESSION:   1. 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   2. 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 There is brisk flow within the cerebral aqueduct across this structure and enlargement of the fourth ventricle, however.   3. Moderate diffuse cerebral volume loss and evidence of chronic ischemic microvascular disease.

## 2016-07-10 NOTE — Patient Instructions (Addendum)
You were seen today in the stroke clinic at [facility]. We discussed your stroke and how to prevent the next one.    We suspect that your hemorrhagic stroke was due to high blood pressure. High blood pressure and blood pressure variability is a risk factor for this.     To help prevent another stroke we recommend the following:    # Keep your blood pressure <130/80 - we will increase your amlodipine from 2.5mg  by mouth twice a day to 5mg  by mouth twice a day  # Please check your blood pressure twice a week  # Maintain healthy diet (Mediterranean Diet)  # Regular exercise (try aquatic therapy)  # Sleep study to evaluate for sleep apnea    Additionally:    # We will order labs for your neuropathy and refer you to our Neuromuscular colleagues at [facility].   # Follow up with Dr. Mayford KnifeWilliams for your hydrocephalus  # get up slowly from a sitting position and wait a few seconds before walking and stay adequately hydrated    Thank you for entrusting me with your medical care today. It is a privilege to be your doctor. If you have any questions, please do not hesitate to contact us.     Sincerely,     Franki CabotJenny Chieko Neises, MD (with Dr. Fredderick SeveranceJonathan Weinstein)  [facility]  Vascular Neurology Fellow

## 2016-07-13 ENCOUNTER — Ambulatory Visit (HOSPITAL_BASED_OUTPATIENT_CLINIC_OR_DEPARTMENT_OTHER): Payer: PPO

## 2016-07-13 ENCOUNTER — Ambulatory Visit: Payer: PPO | Attending: Internal Medicine

## 2016-07-13 ENCOUNTER — Encounter (INDEPENDENT_AMBULATORY_CARE_PROVIDER_SITE_OTHER): Payer: Self-pay | Admitting: Internal Medicine

## 2016-07-13 ENCOUNTER — Ambulatory Visit (INDEPENDENT_AMBULATORY_CARE_PROVIDER_SITE_OTHER): Payer: PPO | Admitting: Internal Medicine

## 2016-07-13 VITALS — BP 140/90 | HR 56 | Ht 76.0 in | Wt 226.5 lb

## 2016-07-13 DIAGNOSIS — R938 Abnormal findings on diagnostic imaging of other specified body structures: Secondary | ICD-10-CM

## 2016-07-13 DIAGNOSIS — I10 Essential (primary) hypertension: Secondary | ICD-10-CM

## 2016-07-13 DIAGNOSIS — I619 Nontraumatic intracerebral hemorrhage, unspecified: Secondary | ICD-10-CM

## 2016-07-13 DIAGNOSIS — R9389 Abnormal findings on diagnostic imaging of other specified body structures: Secondary | ICD-10-CM

## 2016-07-13 DIAGNOSIS — F1021 Alcohol dependence, in remission: Secondary | ICD-10-CM

## 2016-07-13 DIAGNOSIS — Z6827 Body mass index (BMI) 27.0-27.9, adult: Secondary | ICD-10-CM

## 2016-07-13 LAB — FREE LIGHT CHAINS
Kappa Free Light Chain: 1.19 mg/dL (ref 0.33–1.94)
Kappa/Lambda FLC Ratio: 1.34 (ref 0.26–1.65)
Lambda Free Light Chain: 0.89 mg/dL (ref 0.57–2.63)

## 2016-07-13 NOTE — Progress Notes (Signed)
Ronnie GranaDavid Paul Mason is a 65 year old male     Review of patient's allergies indicates:  Allergies   Allergen Reactions   . Bee Venom Hives, Itching and Swelling   . Gabapentin      Flu like symptoms, diarrhea, body ache upset stomach   . Lidocaine Rash     Lidocaine patch. PATIENT STATES HE IS ALLERGIC TO THE ADHESIVE NOT THE PATCH.       BP 140/90   Pulse 56   Ht 6\' 4"  (1.93 m)   Wt (!) 226 lb 8 oz (102.7 kg)   SpO2 97%   BMI 27.57 kg/m     Chief Complaint   Patient presents with   . 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, neuropathy, alcohol in remission , chronic pain off narcotics and pain in cervical spine and lumbar, shoulder pain , hip pain - trochanteric bursitis R- see Dr Ronnie Mason note last 06/24/2016      Today follow-up above issues:     Cough gone no wheezing     Pain better- low back better, bursitis hip last shot- better, neck pain gone   Got us massager - good on hip    His is overall happy with this     Brain bleed possible bp - amlodipine now doubled as of Saturday now 5 mg bid - pending Ronnie Mason follow up    He reports now taking meds      Neuropathy - alcohol in past  , no now alcoholol -        Diet he feels he eats healthy- salad meat pork, wonders how to eat a Mediterranean diet- he will look on internet   H/o vit D defVit D 1000 bid - will need vit D next time      Seeing rehab, Dr Ronnie RudWilkinson, pend sleep and neuromuscular eval     He just bought a  House for their land and is happy   His wife reports he is driving safely but with the new car, taking some use to getting used to how fast it maneuvers- he does not feel in person Driving Rehab is needed  is needed         Office Visit on 07/10/16   1. VITAMIN B12 (COBALAMIN)   Result Value Ref Range    Vitamin B12 (Cobalamin) 961 (H) 180 - 914 pg/mL   2. HEMOGLOBIN A1C, HPLC   Result Value Ref  Range    Hemoglobin A1C 5.3 4.0 - 6.0 %   3. FREE LIGHT CHAINS   Result Value Ref Range    Kappa Free Light Chain 1.19 0.33 - 1.94 mg/dL    Lambda Free Light Chain 0.89 0.57 - 2.63 mg/dL    Kappa/Lambda FLC Ratio 1.34 0.26 - 1.65         MEDICATION PRIOR TO VISIT  Reports   Outpatient Medications Prior to Visit   Medication Sig Dispense Refill   . Acetaminophen 500 MG Oral Tab 2 tablets bid     . Albuterol Sulfate HFA (VENTOLIN HFA) 108 (90 Base) MCG/ACT Inhalation Aero Soln Inhale 2 puffs by mouth every 6 hours as needed for shortness of breath/wheezing. 1 Inhaler 0   . AmLODIPine Besylate 5 MG Oral  Tab Take 1 tablet (5 mg) by mouth 2 times a day. 90 tablet 3   . Atorvastatin Calcium 10 MG Oral Tab Take 1 tablet (10 mg) by mouth daily. 90 tablet 1   . Budesonide 180 MCG/ACT Inhalation AEROSOL POWDER, BREATH ACTIVATED Inhale 1 puff by mouth every 12 hours. As needed 1 Inhaler 1   . Cholecalciferol (VITAMIN D3) 2000 units Oral Cap Take 1 capsule (2,000 Units) by mouth daily. For low level 1 capsule 1   . Clobetasol Propionate 0.05 % External Ointment Small amount to rash as needed bid for 1-2 weeks at a time 15 g 1   . Cyanocobalamin 1000 MCG Oral Tab one per day over-the-counter started 5/21 /2012 this level borderline low 1 Tab 1   . Diclofenac Sodium (VOLTAREN) 1 % Transdermal Gel Apply 4 g topically 4 times a day. Apply to neck, trapezius muscle, and low back. No more than 4 g applied at a time. 1 Tube 5   . DULoxetine HCl 30 MG Oral CAPSULE ENTERIC COATED PARTICLES Take 1 capsule (30 mg) by mouth daily. 90 capsule 1   . DULoxetine HCl 60 MG Oral CAPSULE ENTERIC COATED PARTICLES Take 1 capsule (60 mg) by mouth daily. 90 capsule 1   . EPINEPHrine 0.3 MG/0.3ML Injection Solution Auto-injector Inject as instructed per patient package insert, 0.3 mg intramuscularly or subcutaneously into the thigh, if needed to treat anaphylaxis 3 each 1   . Lidocaine 5 % External Patch Apply 1 patch onto the skin daily. Apply to  painful area for up to 12 hours in a 24 hour period. 30 patch 10   . Lisinopril 20 MG Oral Tab Take 1 tablet (20 mg) by mouth every 12 hours. 180 tablet 1   . Primidone 50 MG Oral Tab Take 1 tablet (50 mg) by mouth 2 times a day. 180 tablet 0   . THIAMINE HCL OR        No facility-administered medications prior to visit.          REVIEW OF SYSTEMS - are otherwise negative unless as noted in HPI above or as additional issues below    Constitutional:     Eyes:    Head ears, nose mouth throat:     Cardiovascular:   Respiratory:     Gastrointestinal:   Genitourinary:      Musculoskeletal:    Integumentary:     Neurological:   Psychiatric/Behavioral:       Endocrine:     Hematological:   Allergic/Immunologic:        PHYSICAL EXAM  BP 140/90   Pulse 56   Ht 6\' 4"  (1.93 m)   Wt (!) 226 lb 8 oz (102.7 kg)   SpO2 97%   BMI 27.57 kg/m     Well Dressed    Constitution, In general looks:   well , good coloring  No diaphoresis  Patient is Alert  & Attentive  Patient is conversive    Psychiatric Mental Status / Neurologic Mental Status:  Affect:  Pleasant   Calm  Mood:  Good  Awake: Alert and attentive      Eyes: no discharge, no gross puffiness, non- icteric      Neck:    Thyroid not enlarged  2+ carotid pulses no bruits    Cardiovascular:   Heart Regular rate , rhythm, no murmur  No edema    Lymph nodes:  No gross cervical swelling  No gross axillary swelling  Respiratory:   Not tachyphpnic  Clear to ascultation  No rales, rhonchi , without increased effort or stridor  No clubbing     Abdomen / Gastrointestinal:  Soft. No masses. No guarding. No rebound  No gross bulge or hernia. Abdomen, groin  No bruit or pulsatile mass    Genitourinary:  No suprapubic tenderness  No increased flank pain with percussion    Musculoskeletal:  No gross abnormal movement, twitch   No gross atrophy or deformity    Skin:  No gross lesion or rash    Neurologic:   Grossly intact  Memory:   Attention       Cranial Nerve    Eyes pupil and lid  are intact   II, III, IV, VIII   Facial Movement VII: grossly intact, no facial droop       Hematologic/Lymphatic /Immunologic:  No gross bruising or rash or hives       IMPRESSION / PLAN / DISCUSSION     Desman was seen today for follow-up .    Diagnoses and all orders for this visit:    Abnormal CXR  -     XR CHEST 2 VW    History of alcohol dependence (HCC)    Intraparenchymal hemorrhage of brain (HCC)    Essential hypertension      Follow up on cxr  Now no symptoms    Now off alcohol  bp is better    He now admits that alcohol may have contributed to the neuropathy  That his  ICH  may add to his lack of understanding and patience and the alcohol may have added the damange  As time passes he is able to gain more understanding and self control and patience but his wife states he is still not quite stable    bp is better but not perfect and he will follow up with cardiology     MEDICATION as of Discharge  Current Outpatient Prescriptions   Medication Sig Dispense Refill   . Acetaminophen 500 MG Oral Tab 2 tablets bid     . Albuterol Sulfate HFA (VENTOLIN HFA) 108 (90 Base) MCG/ACT Inhalation Aero Soln Inhale 2 puffs by mouth every 6 hours as needed for shortness of breath/wheezing. 1 Inhaler 0   . AmLODIPine Besylate 5 MG Oral Tab Take 1 tablet (5 mg) by mouth 2 times a day. 90 tablet 3   . Atorvastatin Calcium 10 MG Oral Tab Take 1 tablet (10 mg) by mouth daily. 90 tablet 1   . Budesonide 180 MCG/ACT Inhalation AEROSOL POWDER, BREATH ACTIVATED Inhale 1 puff by mouth every 12 hours. As needed 1 Inhaler 1   . Cholecalciferol (VITAMIN D3) 2000 units Oral Cap Take 1 capsule (2,000 Units) by mouth daily. For low level 1 capsule 1   . Clobetasol Propionate 0.05 % External Ointment Small amount to rash as needed bid for 1-2 weeks at a time 15 g 1   . Cyanocobalamin 1000 MCG Oral Tab one per day over-the-counter started 5/21 /2012 this level borderline low 1 Tab 1   . Diclofenac Sodium (VOLTAREN) 1 % Transdermal Gel Apply  4 g topically 4 times a day. Apply to neck, trapezius muscle, and low back. No more than 4 g applied at a time. 1 Tube 5   . DULoxetine HCl 30 MG Oral CAPSULE ENTERIC COATED PARTICLES Take 1 capsule (30 mg) by mouth daily. 90 capsule 1   . DULoxetine HCl 60 MG Oral CAPSULE ENTERIC COATED  PARTICLES Take 1 capsule (60 mg) by mouth daily. 90 capsule 1   . EPINEPHrine 0.3 MG/0.3ML Injection Solution Auto-injector Inject as instructed per patient package insert, 0.3 mg intramuscularly or subcutaneously into the thigh, if needed to treat anaphylaxis 3 each 1   . Lidocaine 5 % External Patch Apply 1 patch onto the skin daily. Apply to painful area for up to 12 hours in a 24 hour period. 30 patch 10   . Lisinopril 20 MG Oral Tab Take 1 tablet (20 mg) by mouth every 12 hours. 180 tablet 1   . Primidone 50 MG Oral Tab Take 1 tablet (50 mg) by mouth 2 times a day. 180 tablet 0   . THIAMINE HCL OR        No current facility-administered medications for this visit.          Risk of taking medication properly and follow up discussed especially to call if problem    Patient   Family Member -   express understanding of care plan/medications    I spent a total time of 25 minutes face-to-face with the patient, of which more than 50% was spent counseling and coordinating care as outlined in this note.    Return in about 2 months (around 09/13/2016).     Cc to following MD:        ++++++++++++++++++++++++++++++++++++++++++++++++++    Patient Active Problem List    Diagnosis Date Noted   . Syncope [R55]    . Hypotension [I95.9]    . 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 URI (upper respiratory infection) [J06.9]    . Impaired mobility and ADLs [Z74.09] 04/08/2016   . Difficulty in walking, not elsewhere classified [R26.2] 03/12/2016   . Vitamin D deficiency [E55.9] 02/13/2016   . Balance problem [R26.89] 01/23/2016   . Chronic pain of both shoulders [M25.511, G89.29, M25.512] 01/23/2016     X ray arthritis  02/19/2016       . Pain  of right hip joint [M25.551] 01/23/2016   . 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 Essential hypertension [I10] 12/16/2015   . Essential tremor [G25.0] 12/16/2015   . Hx of spontan intraparenchymal intracran bleed assoc with hypertension [Z86.79] 12/16/2015     [place] [place] 4/29- 12/10/2015  Non-traumatic intraparenchymal hemorrhage, hypertensive of the left thalamus   Traumatic hemorrhage   -- falcine subdural hemorrhage, small cortical subarachnoid     . Alcohol abuse [F10.10] 12/16/2015   . 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 History of alcohol dependence (HCC) [F10.21] 06/26/2015   . 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 Idiopathic peripheral neuropathy [G60.9] 03/15/2015   . 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 Tremor [R25.1] 04/20/2013   . Lumbosacral radiculopathy at S1 [M54.17] 03/22/2013     Left     . Fall [W19.XXXA] 02/23/2013     BP < 100  Occasioanal tri[p and leg weakness     . Back pain, lumbosacral [M54.5] 02/15/2013   . Lumbar radiculopathy, chronic [M54.16] 02/15/2013   . Neck pain [M54.2] 02/15/2013   . Chronic pain [G89.29] 12/21/2012     Now at [facility], DO, Santiago Glad    Neck  pain -burn c3-7 on left side per patient  Some trigger injection    Lumbar pain-since 80's better with exercise     . 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 Numbness and tingling of leg [R20.0, R20.2] 12/21/2012     Left calf -left foot long term suspect from back-suspect L 45     . Chronic back pain [M54.9, G89.29] 12/21/2012     Mri in mid scape 1/214  Multilevel degenerative disc disease of the lumbar  spine. Circumferential disc bulge at all levels below and including L1-2. No significant central spinal canal stenosis or neuroforaminal narrowing.  2. Mild retrolisthesis of L5 on S1.           . B12 deficiency [E53.8] 12/21/2012     Borderline low 5 /21/2014     . Chronic renal insufficiency, stage III (moderate) [N18.3] 12/21/2012     5/ 2013     . Hyperlipidemia [E78.5] 09/26/2012   . Cervicalgia [M54.2] 03/11/2010     Radiofrequency ablation of c3 to c 7           Past Medical History:   Diagnosis Date   . Carotid Sinus Hypersensitivity    . HYPERTENSION     . HYPOTENSION     . Intraparenchymal hemorrhage of brain (HCC) 2017   . Pain of right hip joint 01/23/2016   . 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 Syncope    . 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(HCC)    . URI (upper respiratory infection)        Social History     Social History   . Marital status: Married     Spouse name: N/A   . Number of children: N/A   . Years of education: N/A     Occupational History   . Not on file.     Social History Main Topics   . Smoking status: Never Smoker   . Smokeless tobacco: Former NeurosurgeonUser   . Alcohol use No   . Drug use: No   . Sexual activity: Not on file     Other Topics Concern   . Not on file     Social History Narrative    ** Merged History Encounter **              Family History     Problem (# of Occurrences) Relation (Name,Age of Onset)    Colon Cancer (1) Father    Heart (other) (1) Mother: MI    Other Family Hx (1) Other: myelodysplasia          Social History     Social History   . Marital status: Married     Spouse name: N/A   . Number of children: N/A   . Years of education: N/A     Social History Main Topics   . Smoking status: Never Smoker   . Smokeless tobacco: Former NeurosurgeonUser   . Alcohol use No   . Drug use: No   . Sexual activity: Not on file     Other Topics Concern   . Not on file     Social History Narrative    ** Merged History Encounter **            Health Maintenance    Topic Date Due   . Diabetes Screening  07/11/2019   . Lipid Disorders Screening  09/11/2020   .  Colorectal Cancer Screening (Colonoscopy)  09/24/2020   . Tetanus Vaccine  12/22/2022   . Zoster Vaccine  Completed   . Influenza Vaccine  Completed   . Hepatitis C Screening  Addressed   . HIV Screening  Addressed

## 2016-07-15 NOTE — Progress Notes (Signed)
Left basilar scar atelectasis noted stable since 2009 by report from radiology on cxr   All Is good

## 2016-07-16 NOTE — Progress Notes (Signed)
I reviewed patient history and examined patient with vascular neurology fellow Dr. Siv. I agree with the findings and plans as documented in Dr. Siv's stroke clinic note which I have reviewed and edited.

## 2016-07-17 ENCOUNTER — Telehealth (INDEPENDENT_AMBULATORY_CARE_PROVIDER_SITE_OTHER): Payer: Self-pay | Admitting: Internal Medicine

## 2016-07-17 NOTE — Telephone Encounter (Signed)
(  TEXTING IS AN OPTION FOR [facility] CLINICS ONLY)  Is this a [facility] clinic? No      RETURN CALL: OK to leave detailed message with anyone that answers      SUBJECT:  Medication Management/Questions     MEDICATION(S): Advair   CONCERNS/QUESTIONS: Calling in regard to prior authorization.   ADDITIONAL INFORMATION: Requesting to check on status of prior auth. Thanks!

## 2016-07-20 NOTE — Telephone Encounter (Signed)
Called pt, no answer. Left a voice message stating pt was approved for advair disku 250/50 till 08/02/2038.  Approval letter scanned in.

## 2016-07-21 ENCOUNTER — Other Ambulatory Visit: Payer: Self-pay | Admitting: Internal Medicine

## 2016-07-21 DIAGNOSIS — I10 Essential (primary) hypertension: Secondary | ICD-10-CM

## 2016-07-21 NOTE — Telephone Encounter (Signed)
The patient last received this medication at the requesting pharmacy on 04/18/2016 #[id number]

## 2016-07-22 MED ORDER — LISINOPRIL 20 MG OR TABS
20.0000 mg | ORAL_TABLET | Freq: Two times a day (BID) | ORAL | 1 refills | Status: DC
Start: 2016-07-22 — End: 2016-09-10

## 2016-08-05 ENCOUNTER — Ambulatory Visit (HOSPITAL_BASED_OUTPATIENT_CLINIC_OR_DEPARTMENT_OTHER): Payer: PPO | Attending: Anesthesiology | Admitting: Anesthesiology

## 2016-08-05 VITALS — BP 132/95 | HR 100 | Temp 97.7°F | Resp 22 | Ht 76.0 in | Wt 225.0 lb

## 2016-08-05 DIAGNOSIS — M5416 Radiculopathy, lumbar region: Secondary | ICD-10-CM | POA: Insufficient documentation

## 2016-08-05 DIAGNOSIS — Z6827 Body mass index (BMI) 27.0-27.9, adult: Secondary | ICD-10-CM

## 2016-08-05 NOTE — Patient Instructions (Addendum)
It was nice meeting you today    We discussed the following plan today    - Continue home exercises and physical therapy    - MRI lumbosacral spine order placed. Please make an appointment and follow up    - Utilizing a spinal model, we discussed epidural steroid injections including interlaminar approach. We had a detailed discussion of the procedure, risks, and alternatives. Scheduled for lumbar interlaminar epidural steroid injection    - Follow up on the day of procedure

## 2016-08-05 NOTE — Progress Notes (Signed)
Note deleted

## 2016-08-06 ENCOUNTER — Encounter (HOSPITAL_BASED_OUTPATIENT_CLINIC_OR_DEPARTMENT_OTHER): Payer: Self-pay | Admitting: Physical Medicine & Rehabilitation

## 2016-08-06 ENCOUNTER — Ambulatory Visit (HOSPITAL_BASED_OUTPATIENT_CLINIC_OR_DEPARTMENT_OTHER): Payer: PPO | Attending: Physical Medicine & Rehabilitation | Admitting: Physical Medicine & Rehabilitation

## 2016-08-06 VITALS — BP 123/85 | HR 98 | Temp 98.4°F | Ht 76.0 in | Wt 225.0 lb

## 2016-08-06 DIAGNOSIS — S069X9D Unspecified intracranial injury with loss of consciousness of unspecified duration, subsequent encounter: Secondary | ICD-10-CM

## 2016-08-06 DIAGNOSIS — S069X0S Unspecified intracranial injury without loss of consciousness, sequela: Secondary | ICD-10-CM | POA: Insufficient documentation

## 2016-08-06 DIAGNOSIS — F09 Unspecified mental disorder due to known physiological condition: Secondary | ICD-10-CM | POA: Insufficient documentation

## 2016-08-06 DIAGNOSIS — I619 Nontraumatic intracerebral hemorrhage, unspecified: Secondary | ICD-10-CM | POA: Insufficient documentation

## 2016-08-06 DIAGNOSIS — S069X9A Unspecified intracranial injury with loss of consciousness of unspecified duration, initial encounter: Secondary | ICD-10-CM | POA: Insufficient documentation

## 2016-08-06 DIAGNOSIS — Z6827 Body mass index (BMI) 27.0-27.9, adult: Secondary | ICD-10-CM

## 2016-08-06 DIAGNOSIS — S069XAA Unspecified intracranial injury with loss of consciousness status unknown, initial encounter: Secondary | ICD-10-CM | POA: Insufficient documentation

## 2016-08-06 DIAGNOSIS — Z87820 Personal history of traumatic brain injury: Secondary | ICD-10-CM | POA: Insufficient documentation

## 2016-08-06 DIAGNOSIS — R2689 Other abnormalities of gait and mobility: Secondary | ICD-10-CM

## 2016-08-06 DIAGNOSIS — S069XAS Unspecified intracranial injury with loss of consciousness status unknown, sequela: Secondary | ICD-10-CM

## 2016-08-06 DIAGNOSIS — G3189 Other specified degenerative diseases of nervous system: Secondary | ICD-10-CM | POA: Insufficient documentation

## 2016-08-06 NOTE — Progress Notes (Signed)
Physiatry (PM&R)  [facility]    Portions of this chart may have been created with speech recognition software. Occasional wrong word or "sound-alike" substitutions may have occurred    Visit Information:  Accompanied to clinic by: his wife, who provides additional history   Additional information obtained from review of electronic medical records   Last clinic visit: 02/27/16  Language: [place];    CC: Cognitive impairment and agitation after thalamic IPH    ID: Mr. 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  Now with persistent cognitive impairment and aggression.    HPI:  He has completed outpatient therapy at [facility] since his last clinic visit.    Function: at home, he is able to do ADLs including shower, dressing, and toileting independently.      Cognitive impairment: Denies difficulty with memory, concentration, and thinking.  Still easily forgets things.  For example, he lost his checkbook, which he found it in his glove box.  He loses his keys and glasses easily in the house as well as in the community.  He also easily forgets appointments that his wife reminds him.    Underwent driving eval with OT at [facility] and passed the test.  He has been cleared to drive  Has not done on-road driving.  His wife continues to notice that he is jerky which his baseline.  He also needed to get used to his new car.  His wife feels safe.     Agitation/irritability: his wife continues to note episodes of anger and irritability that are back to his baseline.  He also does things that irritate her but seems to be something he had been doing for a long time.     Sleep difficulties: Continues to report difficulty staying asleep.  Recently had sleep medicine referral which  is pending.    Right leg pain: began around Thanksgiving, he was diagnosed with trochanteric bursitis and then about a week later, he developed burning and radiating pain (down the lateral leg).  He uses TENS unit and ultrasound to help with his pain on his own with some relief, which allows him to sleep.  He also continued with PT and has a pending lumbar spine MRI and plan to follow up with pain clinic for possible epidural injection.  Due to this pain, he has not been able to walk or exercise.     ROS:   Review of systems is as per HPI and below. All other systems reviewed and otherwise negative.    MSK: Reports persistent back pain, cyst chronic  Neuro: Denies new weakness or sensory change  Psych: Reports sleep difficulties; denies anxiety or depression    Falls since last medical visit: NO    Social History: Lives with his significant other.  Denies smoking or using nicotine.  Also denies striking alcohol or using marijuana/other recreational drugs.    Review of patient's allergies indicates:  Allergies   Allergen Reactions   . Bee Venom Hives, Itching and Swelling   . Gabapentin      Flu like symptoms, diarrhea, body ache upset stomach   . Lidocaine Rash     Lidocaine patch. PATIENT STATES HE IS ALLERGIC TO THE ADHESIVE NOT THE PATCH.  Current Outpatient Prescriptions   Medication Sig Dispense Refill   . Acetaminophen 500 MG Oral Tab 2 tablets bid     . Albuterol Sulfate HFA (VENTOLIN HFA) 108 (90 Base) MCG/ACT Inhalation Aero Soln Inhale 2 puffs by mouth every 6 hours as needed for shortness of breath/wheezing. 1 Inhaler 0   . AmLODIPine Besylate 5 MG Oral Tab Take 1 tablet (5 mg) by mouth 2 times a day. 90 tablet 3   . Atorvastatin Calcium 10 MG Oral Tab Take 1 tablet (10 mg) by mouth daily. 90 tablet 1   . Budesonide 180 MCG/ACT Inhalation AEROSOL POWDER, BREATH ACTIVATED Inhale 1 puff by mouth every 12 hours. As needed 1 Inhaler 1   . Cholecalciferol (VITAMIN D3) 2000 units Oral Cap Take 1 capsule  (2,000 Units) by mouth daily. For low level 1 capsule 1   . Clobetasol Propionate 0.05 % External Ointment Small amount to rash as needed bid for 1-2 weeks at a time 15 g 1   . Cyanocobalamin 1000 MCG Oral Tab one per day over-the-counter started 5/21 /2012 this level borderline low 1 Tab 1   . Diclofenac Sodium (VOLTAREN) 1 % Transdermal Gel Apply 4 g topically 4 times a day. Apply to neck, trapezius muscle, and low back. No more than 4 g applied at a time. 1 Tube 5   . DULoxetine HCl 30 MG Oral CAPSULE ENTERIC COATED PARTICLES Take 1 capsule (30 mg) by mouth daily. 90 capsule 1   . DULoxetine HCl 60 MG Oral CAPSULE ENTERIC COATED PARTICLES Take 1 capsule (60 mg) by mouth daily. 90 capsule 1   . EPINEPHrine 0.3 MG/0.3ML Injection Solution Auto-injector Inject as instructed per patient package insert, 0.3 mg intramuscularly or subcutaneously into the thigh, if needed to treat anaphylaxis 3 each 1   . Lidocaine 5 % External Patch Apply 1 patch onto the skin daily. Apply to painful area for up to 12 hours in a 24 hour period. 30 patch 10   . Lisinopril 20 MG Oral Tab Take 1 tablet (20 mg) by mouth every 12 hours. 180 tablet 1   . Primidone 50 MG Oral Tab Take 1 tablet (50 mg) by mouth 2 times a day. 180 tablet 0   . THIAMINE HCL OR        No current facility-administered medications for this visit.      Physical Exam:  VS: BP 123/85   Pulse 98   Temp 98.4 F (36.9 C) (Temporal)   Ht 6\' 4"  (1.93 m)   Wt (!) 225 lb (102.1 kg)   BMI 27.39 kg/m  Ht 6\' 4"  (1.93 m) Wt 225 lb (102.059 kg) Body mass index is 27.39 kg/m.   Pain: 10/10 right leg pain   GEN: Normally developed, appearing stated age, neatly groomed and dressed in casual clothes, NAD.    HEAD: NC.  Atraumatic.   EYES: Sclera non-icteric, non-injected   EARS, NOSE, MOUTH, THROAT: Oropharynx clear, moist mucus membranes   PSYCH / COGNITIVE: appropriate, pleasant, cooperative, good eye contact  Rancho score: VIII   LOC : Alert   Oriented to: Self, Name of  hospital/clinic, Floor, [place], [place], [place], [place], Date, Day of Week, Year, Season   Registration: 5/5; able to spell WORLD forwards and backwards (purple, jazz, liberty, table, truck) '36  Recall: 2/5 at 5 minutes; no interference from spelling task.  2/3 with category cues.  Able to recall last word with multiple choice cue.    EXECUTIVE FUNCTION:  Able to name the months of the year forward and backwards without error  Able to name the days of the week forward and backwards without error   Unable to arrange the days of the week in alphabetical order:   Monday, Friday, Thursday, Tuesday, Wednesday, Saturday, Sunday (unable to recognize error each time without cues)  Friday, Monday, Tuesday, Thursday, Saturday, Sunday, Wednesday  Friday, Monday, Saturday, Sunday, Tuesday, Thursday, Wednesday (still incorrect but doesn't recognize)    Agitated Behavior Scale:  1 = absent: the behavior is not present.  2 = present to a slight degree: the behavior is present but does not prevent  the conduct of other, contextually appropriate behavior. (The individual  may redirect spontaneously, or the continuation of the agitated behavior  does not disrupt appropriate behavior. )  3 = present to a moderate degree: the individual needs to be redirected from  an agitated to an appropriate behavior, but benefits from such cueing.  4 = present to an extreme degree: the individual is not able to engage in  appropriate behavior due to the interference of the agitated behavior,  even when external cueing or redirection is provided.     1. Short attention span, easy distractibility, inability to concentrate = 2  2. Impulsive, impatient, low tolerance for pain or frustration = 2  3. Uncooperative, resistant to care, demanding = 2  4. Violent and or threatening violence toward people or property = 1  5. Explosive and/or unpredictable anger = 1  6. Rocking, rubbing, moaning or other self-stimulating behavior = 1  7. Pulling at tubes,  restraints, etc = 1  8. Wandering from treatment areas = 1  9. Restlessness, pacing, excessive movement = 1  10. Repetitive behaviors, motor and/or verbal = 2  11. Rapid, loud or excessive talking = 3  12. Sudden changes of mood = 1  13. Easily initiated or excessive crying and/or laughter = 1   14. Self-abusiveness, physical and/or verbal = 1  Total Score= 20  (N<21 or below: within normal limits; 22- 28: mild occurrence; 29 - 35: moderate; and > 35: severe)        Assessment and Plan:  Mr. Valentine Barney is a 66 year old year old male now 9 months out from left thalamic hemorrhagic stroke and complicated-mild TBI with R SDH and facial fractures with associated cognitive impairment, agitation/irritability, and sleep difficulties. Further assessment/treatment recommended for the following issues:    1. 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  Continues to have difficulty with short-term memory.  He always asks same question during his clinic visits despite lengthy discussion each time.  Also noted to have executive dysfunction.  Improving but not back to baseline.  Does not interfere with his ADLs or IADLs.   - Continue to monitor clinically.  At this time no medication for his cognitive impairment.  May benefit from returning to speech therapy in the future or neuropsychology evaluation.  However, controlling his pain and sleep difficulty may also help with his cognitive function and should be addressed first   - Counseled to continue with exercise as tolerated    2. Agitation/irritability: Improved, likely back to his baseline.  Likely due to his TBI.   - Continue to monitor clinically.     3. Sleep difficulties: Referral has been placed.  Counseled to make appointment with sleep medicine clinic    4.  Complicated-mild TBI and stroke: Discussed  risk factors for recurrent TBI and stroke, mainly preventing fall and controlling blood pressure.   Mr. Germond has persistent question on this matter despite it being went over multiple times.   - Counseled to continue getting treated for his blood pressure.  Currently well controlled on current medication.   - Discussed avoiding any alcohol at this time especially given his risk for fall.    5. Right leg pain: Likely due to right L5 radiculopathy.  Continue with treatment with pain medicine and physical therapy    Return to clinic for follow-up: 6 months to assess if he will benefit from returning to therapy at [facility]    Follow-up with other providers as scheduled    At least 25 minutes was spent in face-to-face time in the care of the patient, of which more than 50% was spent in counseling  as outlined in this note

## 2016-08-06 NOTE — Patient Instructions (Signed)
Thank you for coming to Rehabilitation Medicine Clinic today.   Our goal is to provide easy to understand instructions.      Reminders from your visit today:  - To prevent another stroke or traumatic brain injury, metastases to following:   Control your blood pressure for stroke prevention.  Please continue to follow with you neurologist and cardiologist   Decreased risk for falls.  This includes avoiding alcohol.  This is because:  o Alcohol may slow your brain from recovering.  It can also increase your risk of reinjury by affecting your cognition and balance.   o Negative effect of alcohol can linger for days or weeks after the last drink after brain injury.    o Alcohol is a depressant to your brain, making it harder for you to balance and recover from a loss of balance    - Please continue with exercise and follow-up with other providers including sleep medicine    Have other questions? - Just let us know, we'd be happy to help. Please feel free to call us at: [phone] option [phone] to speak with rehab nursing staff during business hours.      If a rehab clinic nurse is not available to answer, your phone will be forwarded to the call center [phone]).  Please ask to speak with at rehabilitation clinic nurse if you do get forwarded to them.    You can also use the "email" feature in eCare/myChart.    We want your care experience to be excellent every time - if you receive a survey in the mail please fill it out and mail it back.    Dr. Ferdinand LangoJunn appreciates the opportunity to work with you, your primary care provider and the rest of your health care team to work to optimize your function and quality of life.

## 2016-08-07 NOTE — Progress Notes (Signed)
[facility]  10/16/2015   Ron[facility]  [id number]    REFERRED BY:   No referring provider defined for this encounter.  PCP:  Ronnie Carls, MD (General)  [address] / [place] [address]    REFERRED FOR: Evaluation of low back and neck pain  with the following specific issues to be addressed:   CHIEF COMPLAINT:   No chief complaint on file.      HISTORY OF PRESENT ILLNESS:  Ronnie Mason is a 66 year old male who I have seen for chronic pain located in lower back pain, specifically left L4 through S1.  He has since undergone bilateral L4-S1 RFAs which gave him complete relief of his back pain. Recently he was seen by me for right trochanteric bursa injection, which helped with his lateral hip pain, but he continues to have progressing pain that now shoots down his entire right leg. He denies unintentional weight loss, denies night sweats or fevers.  Ronnie Mason endorses taking duloxetine  90 mg qd, melixcam, APAP, diclofenac transdermal gel, and lidocaine 5% patch, TENS unit     Injections in our clinic have included (exluding MBB and trigger point injections):  07/01/16 - Ultrasound guided right trochanteric bursa injection   05/20/16 - Bilateral shoulder injections  05/13/16 - Right L3 L4 L5/S1 RFA  02/19/16 -  Left L3, L4, L5/S1 RFA  12/27/12 - L5-S1 IL ESI  08/31/12 - Left L5 TFESI  03/30/12 - Left L4/5, L5/S1 facet inj  02/10/12 - Left C6 C7 MB RFA  07/03/11 - Left L3, L4, L5/S1 RFA  05/12/11 - Left C4, C5, C6 RFA      Review of Systems:  1. Constitutional: Negative   2. Cardiovascular: Negative   3. Respiratory: Negative   4. MSK: As noted in HPI  5. Neuro: Denies lower extremity weakness      PAST HISTORY:  Past Medical History:   Diagnosis Date   . Carotid Sinus Hypersensitivity    . HYPERTENSION     . HYPOTENSION     . Intraparenchymal hemorrhage of brain (HCC) 2017   . Pain of right hip joint 01/23/2016   . 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 myelopathy or  radiculopathy 05/28/2014   . Syncope    . 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(HCC)    . URI (upper respiratory infection)      Past Surgical History:   Procedure Laterality Date   . ANES; COLONOSCOPY  2002    repeat in 3 years   . ANES; COLONOSCOPY & POLYPECTOMY  11/18/2007    repeat in 3 years   . UNLISTED PROCEDURE FEMUR/KNEE     . UNLISTED PROCEDURE HANDS/FINGERS     . UNLISTED PROCEDURE SPINE  2013    rfa to c3 to c 7      Family History   Problem Relation Age of Onset   . Colon Cancer Father    . Heart (other) Mother      MI   . Other Family Hx Other      myelodysplasia     Social History     Social History Narrative    ** Merged History Encounter **            Allergies: Bee venom; Gabapentin; and Lidocaine    Physical Exam:  General:  NAD, seated comfortably in chair  Respiratory:  Speaking in full sentences.   Neuro: strength 5/5 in lower extremities  in both proximal and distal muscle groups.   MSK: no pain with palpation over the right trochanteric bursa,St. Leg raise is positive for shooting pain down to foot on right at 30 degrees and exacerbated by foot dorsiflexion. Negative on the left at 60 degrees. No point tenderness with percussion down spine  Neuro: bilateral 0/2 patellar reflexes, 0/2 bilateral achilles     Past medical, surgical, family, and social history as above was reviewed and updated personally with Ronnie Mason.  Medications and allergies also reviewed and confirmed personally.    IMAGING:  [facility]  Moderate right hip osteoarthritis     MRI L-spine 04/11/15 [facility]      Assessment/Plan:  Ronnie Mason is a 66 year old male with progressive radicular pain suggestive of an L5 radiculopathy. When I last saw him on 11/22 I was concerned he may have right hip arthritis. However, he now has symptoms that extend into his foot which points to a more central etiology. He last had a lumbar MRI 9/16, but given his new symptoms, I would like to obtain a repeat image to  reevaluate degree of stenosis and evaluate for right sided pathology    Plan:  - Continue home exercises and physical therapy  - MRI lumbosacral spine order placed. Please make an appointment and follow up  - Utilizing a spinal model, we discussed epidural steroid injections including interlaminar approach. We had a detailed discussion of the procedure, risks, and alternatives. Scheduled for lumbar interlaminar epidural steroid injection  - Follow up on the day of procedure

## 2016-08-10 ENCOUNTER — Ambulatory Visit: Payer: PPO | Attending: Anesthesiology

## 2016-08-10 DIAGNOSIS — M5136 Other intervertebral disc degeneration, lumbar region: Secondary | ICD-10-CM

## 2016-08-10 DIAGNOSIS — M5416 Radiculopathy, lumbar region: Secondary | ICD-10-CM | POA: Insufficient documentation

## 2016-08-10 DIAGNOSIS — M5127 Other intervertebral disc displacement, lumbosacral region: Secondary | ICD-10-CM | POA: Insufficient documentation

## 2016-08-12 ENCOUNTER — Ambulatory Visit (HOSPITAL_BASED_OUTPATIENT_CLINIC_OR_DEPARTMENT_OTHER): Payer: PPO | Attending: Anesthesiology | Admitting: Anesthesiology

## 2016-08-12 ENCOUNTER — Encounter (INDEPENDENT_AMBULATORY_CARE_PROVIDER_SITE_OTHER): Payer: PPO | Admitting: Cardiovascular Disease

## 2016-08-12 ENCOUNTER — Other Ambulatory Visit: Payer: Self-pay

## 2016-08-12 DIAGNOSIS — M5416 Radiculopathy, lumbar region: Secondary | ICD-10-CM

## 2016-08-12 DIAGNOSIS — M545 Low back pain: Secondary | ICD-10-CM | POA: Insufficient documentation

## 2016-08-13 ENCOUNTER — Encounter (INDEPENDENT_AMBULATORY_CARE_PROVIDER_SITE_OTHER): Payer: Self-pay | Admitting: Internal Medicine

## 2016-08-13 DIAGNOSIS — G609 Hereditary and idiopathic neuropathy, unspecified: Secondary | ICD-10-CM

## 2016-08-14 MED ORDER — PREGABALIN 25 MG OR CAPS
25.0000 mg | ORAL_CAPSULE | Freq: Two times a day (BID) | ORAL | 0 refills | Status: DC
Start: 2016-08-14 — End: 2016-08-14

## 2016-08-14 MED ORDER — PREGABALIN 25 MG OR CAPS
25.0000 mg | ORAL_CAPSULE | Freq: Two times a day (BID) | ORAL | 0 refills | Status: DC
Start: 2016-08-14 — End: 2016-10-20

## 2016-08-14 NOTE — Telephone Encounter (Signed)
Script to front desk

## 2016-08-19 NOTE — Telephone Encounter (Signed)
LVM for pt to call back clinic.

## 2016-08-19 NOTE — Telephone Encounter (Signed)
No notice of pick up of medication from front desk. Usually a DOT phrase is used. I am routing to provider to get OK if verbal call be called in and patient can obtain medication. Don from the pharmacy stated that wife stated patient picked form up but Pharmacy has nothing. Please call Ronnie CoombsDon as patient wife is there. Ronnie Coombson [phone]

## 2016-08-20 NOTE — Telephone Encounter (Signed)
Called script into pharmacy ok per kraft

## 2016-08-24 ENCOUNTER — Other Ambulatory Visit: Payer: Self-pay | Admitting: Internal Medicine

## 2016-08-24 DIAGNOSIS — M545 Low back pain, unspecified: Secondary | ICD-10-CM

## 2016-08-24 DIAGNOSIS — E785 Hyperlipidemia, unspecified: Secondary | ICD-10-CM

## 2016-08-24 DIAGNOSIS — G894 Chronic pain syndrome: Secondary | ICD-10-CM

## 2016-08-24 DIAGNOSIS — M542 Cervicalgia: Secondary | ICD-10-CM

## 2016-08-24 DIAGNOSIS — G8929 Other chronic pain: Secondary | ICD-10-CM

## 2016-08-24 NOTE — Telephone Encounter (Signed)
The patient last received this medication at the requesting pharmacy on  05/21/16

## 2016-08-25 MED ORDER — ATORVASTATIN CALCIUM 10 MG OR TABS
10.0000 mg | ORAL_TABLET | Freq: Every day | ORAL | 0 refills | Status: DC
Start: 2016-08-25 — End: 2016-11-23

## 2016-08-25 MED ORDER — DULOXETINE HCL 60 MG OR CPEP
60.0000 mg | DELAYED_RELEASE_CAPSULE | Freq: Every day | ORAL | 0 refills | Status: DC
Start: 2016-08-25 — End: 2016-11-23

## 2016-08-25 NOTE — Telephone Encounter (Signed)
Left voice mail

## 2016-08-25 NOTE — Telephone Encounter (Signed)
Patient last seen on 07/13/16 and was to return in 2 months.  One refill authorized.  Please schedule follow up visit.

## 2016-08-31 ENCOUNTER — Encounter (INDEPENDENT_AMBULATORY_CARE_PROVIDER_SITE_OTHER): Payer: Self-pay | Admitting: Internal Medicine

## 2016-09-01 ENCOUNTER — Other Ambulatory Visit (INDEPENDENT_AMBULATORY_CARE_PROVIDER_SITE_OTHER): Payer: Self-pay | Admitting: Internal Medicine

## 2016-09-01 DIAGNOSIS — G609 Hereditary and idiopathic neuropathy, unspecified: Secondary | ICD-10-CM

## 2016-09-03 NOTE — Telephone Encounter (Addendum)
Yes we need to put in PA for lyrica  Neuropathy  And neuropathic pain   In tolerant of gabapentin   Recommended by specialist     I sent him ecare to let him know

## 2016-09-03 NOTE — Telephone Encounter (Signed)
Patients insurance requiring prior authorization for prescription    Medication Requested: Lyrica 25mg      Pharmacy:   [id number] [address] [place] [phone]   [address]  [place] [id number]  Phone: [phone] Fax: [phone]      @PAYORNAME @  Payor: [employer] / Plan: [employer] PREFERRED/PPO / Product Type: blue shield  @PAYORNAM1 @  @MEMBERID1 @    Approved: NO  Prior Authorization Number: n/a  Patient/Pharmacy informed: NO      Medication has been denied by insurance , patient not yet notified , Do you want to appeal denial ?

## 2016-09-04 NOTE — Telephone Encounter (Signed)
PA for lyrica was denied

## 2016-09-04 NOTE — Telephone Encounter (Signed)
Why?  

## 2016-09-07 ENCOUNTER — Other Ambulatory Visit: Payer: Self-pay | Admitting: Internal Medicine

## 2016-09-07 DIAGNOSIS — G894 Chronic pain syndrome: Secondary | ICD-10-CM

## 2016-09-07 DIAGNOSIS — M542 Cervicalgia: Secondary | ICD-10-CM

## 2016-09-07 DIAGNOSIS — M545 Low back pain, unspecified: Secondary | ICD-10-CM

## 2016-09-07 DIAGNOSIS — G8929 Other chronic pain: Secondary | ICD-10-CM

## 2016-09-07 NOTE — Telephone Encounter (Signed)
The patient last received this medication at the requesting pharmacy on 06/10/16

## 2016-09-08 MED ORDER — DULOXETINE HCL 30 MG OR CPEP
30.0000 mg | DELAYED_RELEASE_CAPSULE | Freq: Every day | ORAL | 0 refills | Status: DC
Start: 2016-09-08 — End: 2016-11-30

## 2016-09-09 NOTE — Telephone Encounter (Signed)
Denial letter on desk

## 2016-09-09 NOTE — Telephone Encounter (Addendum)
Start appeal  They said he has to try or failed one of the following for 30 days     Has not tried   Amitriptyline  Desipramine  Imipramine  nortriptlyline     Already on Duloxetine    Please appeal as he has been already on the duloxeine   I think cognitive impairment+  balance will be worst with the other meds and I don't want to give the       Amitriptyline  Desipramine  Imipramine  nortriptlyline

## 2016-09-10 ENCOUNTER — Ambulatory Visit (INDEPENDENT_AMBULATORY_CARE_PROVIDER_SITE_OTHER): Payer: PPO | Admitting: Cardiovascular Disease

## 2016-09-10 ENCOUNTER — Encounter (INDEPENDENT_AMBULATORY_CARE_PROVIDER_SITE_OTHER): Payer: Self-pay | Admitting: Cardiovascular Disease

## 2016-09-10 VITALS — BP 126/84 | HR 88 | Ht 76.0 in | Wt 236.6 lb

## 2016-09-10 DIAGNOSIS — S069X9D Unspecified intracranial injury with loss of consciousness of unspecified duration, subsequent encounter: Secondary | ICD-10-CM

## 2016-09-10 DIAGNOSIS — I950 Idiopathic hypotension: Secondary | ICD-10-CM

## 2016-09-10 DIAGNOSIS — Z6828 Body mass index (BMI) 28.0-28.9, adult: Secondary | ICD-10-CM

## 2016-09-10 DIAGNOSIS — I10 Essential (primary) hypertension: Secondary | ICD-10-CM

## 2016-09-10 MED ORDER — LISINOPRIL 20 MG OR TABS
20.0000 mg | ORAL_TABLET | Freq: Two times a day (BID) | ORAL | 3 refills | Status: DC
Start: 2016-09-10 — End: 2017-10-14

## 2016-09-10 NOTE — Progress Notes (Signed)
Cardiovascular Clinical Evaluation Note    Primary Care Provider: Shann MedalKraft, Vara Viseskul, MD    Referring Provider: No ref. provider found    DOB:  08/14/1950    CHIEF COMPLAINT:  Hyper and hypotension, status post intracranial bleed    HISTORY OF PRESENT ILLNESS:  Ronnie Mason seems better controlled at present.  I think the biggest issue has been reduction in pain which keeps his blood pressure from spiking.  He reports working out 4 times a week with a Psychologist, educationaltrainer.  He is using a pool and  machines.  There are no reports of chest pain or shortness of breath.  He has had no recurrence of any syncope      PROBLEM LIST:  Patient Active Problem List    Diagnosis Date Noted   . Cervicalgia [M54.2] 03/11/2010     Radiofrequency ablation of c3 to c 7     . Syncope [R55]    . Hypotension [I95.9]    . 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 URI (upper respiratory infection) [J06.9]    . 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 Impaired mobility and ADLs [Z74.09] 04/08/2016   . Difficulty in walking, not elsewhere classified [R26.2] 03/12/2016   . Vitamin D deficiency [E55.9] 02/13/2016   . Balance problem [R26.89] 01/23/2016   . Chronic pain of both shoulders [M25.511, G89.29, M25.512] 01/23/2016     X ray arthritis  02/19/2016       . Pain of right hip joint [M25.551] 01/23/2016   . 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 Essential hypertension [I10] 12/16/2015   . Essential tremor [G25.0] 12/16/2015   . Hx of spontan intraparenchymal intracran bleed assoc with hypertension [Z86.79] 12/16/2015     [place] 4/29- 12/10/2015  Non-traumatic intraparenchymal hemorrhage, hypertensive of the left thalamus   Traumatic hemorrhage   -- falcine subdural hemorrhage, small cortical subarachnoid     . Alcohol abuse  [F10.10] 12/16/2015   . 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 History of alcohol dependence (HCC) [F10.21] 06/26/2015   . 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 Idiopathic peripheral neuropathy [G60.9] 03/15/2015   . 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 Tremor [R25.1] 04/20/2013   . Lumbosacral radiculopathy at S1 [M54.17] 03/22/2013     Left     . Fall [W19.XXXA] 02/23/2013     BP < 100  Occasioanal tri[p and leg weakness     . Back pain, lumbosacral [M54.5] 02/15/2013   . Lumbar radiculopathy, chronic [M54.16] 02/15/2013   . Neck pain [M54.2] 02/15/2013   . Chronic pain [G89.29] 12/21/2012     [facility], [facility], [place] Gladebecca Capen    Neck pain -burn c3-7 on left side per patient  Some trigger injection    Lumbar pain-since 80's better with exercise     . 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 Numbness and tingling of leg [R20.0, R20.2] 12/21/2012     Left calf -left foot long term suspect from back-suspect L 45     . Chronic back pain [M54.9, G89.29] 12/21/2012     Mri in mid scape 1/214  Multilevel  degenerative disc disease of the lumbar spine. Circumferential disc bulge at all levels below and including L1-2. No significant central spinal canal stenosis or neuroforaminal narrowing.  2. Mild retrolisthesis of L5 on S1.           . B12 deficiency [E53.8] 12/21/2012     Borderline low 5 /21/2014     . Chronic renal insufficiency, stage III (moderate) [N18.3] 12/21/2012     5/ 2013     . Hyperlipidemia [E78.5] 09/26/2012       MEDICATIONS:    Current Outpatient Prescriptions   Medication Sig Dispense Refill   . Acetaminophen 500 MG Oral Tab 2 tablets bid     . Albuterol Sulfate HFA (VENTOLIN HFA) 108 (90 Base) MCG/ACT Inhalation Aero Soln Inhale 2 puffs by mouth every 6 hours as needed for shortness of breath/wheezing.  1 Inhaler 0   . AmLODIPine Besylate 5 MG Oral Tab Take 1 tablet (5 mg) by mouth 2 times a day. 90 tablet 3   . Atorvastatin Calcium 10 MG Oral Tab Take 1 tablet (10 mg) by mouth daily. 90 tablet 0   . Budesonide 180 MCG/ACT Inhalation AEROSOL POWDER, BREATH ACTIVATED Inhale 1 puff by mouth every 12 hours. As needed 1 Inhaler 1   . Cholecalciferol (VITAMIN D3) 2000 units Oral Cap Take 1 capsule (2,000 Units) by mouth daily. For low level 1 capsule 1   . Clobetasol Propionate 0.05 % External Ointment Small amount to rash as needed bid for 1-2 weeks at a time 15 g 1   . Cyanocobalamin 1000 MCG Oral Tab one per day over-the-counter started 5/21 /2012 this level borderline low 1 Tab 1   . Diclofenac Sodium (VOLTAREN) 1 % Transdermal Gel Apply 4 g topically 4 times a day. Apply to neck, trapezius muscle, and low back. No more than 4 g applied at a time. 1 Tube 5   . DULoxetine HCl 30 MG Oral CAPSULE ENTERIC COATED PARTICLES Take 1 capsule (30 mg) by mouth daily. 90 capsule 0   . DULoxetine HCl 60 MG Oral CAPSULE ENTERIC COATED PARTICLES Take 1 capsule (60 mg) by mouth daily. 90 capsule 0   . EPINEPHrine 0.3 MG/0.3ML Injection Solution Auto-injector Inject as instructed per patient package insert, 0.3 mg intramuscularly or subcutaneously into the thigh, if needed to treat anaphylaxis 3 each 1   . Lidocaine 5 % External Patch Apply 1 patch onto the skin daily. Apply to painful area for up to 12 hours in a 24 hour period. 30 patch 10   . Lisinopril 20 MG Oral Tab Take 1 tablet (20 mg) by mouth every 12 hours. 180 tablet 3   . Pregabalin 25 MG Oral Cap Take 1 capsule (25 mg) by mouth 2 times a day. As needed (Patient not taking: Reported on 09/10/2016) 180 capsule 0   . Primidone 50 MG Oral Tab Take 1 tablet (50 mg) by mouth 2 times a day. 180 tablet 0   . THIAMINE HCL OR        No current facility-administered medications for this visit.        ALLERGIES:  Bee venom; Gabapentin; and Lidocaine      PSFH: Accompanied by his  wife.  Ports that the final evaluation of his head injury was that he probably suffered a hypertensive bleed and syncope following that rather than a traumatic bleed.    REVIEW OF SYSTEMS:  See Cardiovascular HPI for pertinent positives and negatives.   RESPIRATORY:  No  sputum, hemoptysis.    PHYSICAL EXAM    CONSTITUTIONAL:  This is a well-nourished male in no distress.   BP 126/84   Pulse 88   Ht 6\' 4"  (1.93 m) Comment: pt provided  Wt (!) 236 lb 9.6 oz (107.3 kg)   BMI 28.80 kg/m   NECK:  Jugular venous pressure normal.  Thyroid is not enlarged.  RESPIRATORY:   Clear to percussion and auscultation.  CARDIOVASCULAR:  Regular rhythm.  S1 and S2 normal. No S3, S4, no murmur.  EXTREMITIES:  No clubbing, cyanosis, edema or tenderness noted.      IMPRESSION :   1.  Hypertension, controlled  2.  Hypotension: This is created syncope in the past due to overmedication  3.  Status post intracranial bleed  4.  Chronic pain, improved    PLAN:    No changes were made.  As long as pressures are stable we'll plan to see him back in one year    Electronically signed by Eilleen Kempf, MD    09/10/2016  12:53 PM    CC:   [name], MD  No ref. provider found

## 2016-09-10 NOTE — Patient Instructions (Signed)
No change in meds.

## 2016-09-14 ENCOUNTER — Encounter (INDEPENDENT_AMBULATORY_CARE_PROVIDER_SITE_OTHER): Payer: Self-pay | Admitting: Internal Medicine

## 2016-09-22 ENCOUNTER — Other Ambulatory Visit: Payer: Self-pay | Admitting: Internal Medicine

## 2016-09-22 DIAGNOSIS — G250 Essential tremor: Secondary | ICD-10-CM

## 2016-09-23 MED ORDER — PRIMIDONE 50 MG OR TABS
50.0000 mg | ORAL_TABLET | Freq: Two times a day (BID) | ORAL | 0 refills | Status: DC
Start: 2016-09-23 — End: 2016-11-30

## 2016-09-23 NOTE — Telephone Encounter (Signed)
From: Aleda Granaavid Paul Sharrow  Sent: 09/22/2016 6:47 PM PST  Subject: Medication Renewal Request    Ronnie Mason would like a refill of the following medications:     Primidone 50 MG Oral Tab Macario Carls[Vara V Kraft, MD]   Patient Comment: Dr Marlou PorchKraft-Please renew this med. Thanks [name]    [id number] [place] [phone]

## 2016-10-01 ENCOUNTER — Encounter (HOSPITAL_BASED_OUTPATIENT_CLINIC_OR_DEPARTMENT_OTHER): Payer: Self-pay | Admitting: Neurology

## 2016-10-01 ENCOUNTER — Ambulatory Visit (HOSPITAL_BASED_OUTPATIENT_CLINIC_OR_DEPARTMENT_OTHER): Payer: PPO | Attending: Neurology | Admitting: Neurology

## 2016-10-01 VITALS — BP 127/89 | HR 89 | Ht 76.0 in | Wt 225.0 lb

## 2016-10-01 DIAGNOSIS — Z8679 Personal history of other diseases of the circulatory system: Secondary | ICD-10-CM | POA: Insufficient documentation

## 2016-10-01 DIAGNOSIS — G4733 Obstructive sleep apnea (adult) (pediatric): Secondary | ICD-10-CM | POA: Insufficient documentation

## 2016-10-01 DIAGNOSIS — Z6827 Body mass index (BMI) 27.0-27.9, adult: Secondary | ICD-10-CM

## 2016-10-01 NOTE — Patient Instructions (Signed)
Thank you for coming in today. During today's visit, we reviewed only your sleep related medications. Please follow up with your Primary Care Provider for any questions related to your other medications. Please also be aware that there is an increased risk of an accident or injury while driving or performing other activities that require your attention and alertness when you feel sleepy, tired or fatigued. I recommended that you always avoid such activities when you feel this way.

## 2016-10-01 NOTE — Progress Notes (Signed)
SLEEP CENTER INITIAL CONSULTATION    REFERRING PROVIDER: Fredderick Severance Ric*    CC    We are asked to see this patient referred for consultation from Fredderick Severance Ric for evaluation of possible sleep apnea.    HISTORY OF PRESENT ILLNESS    Ronnie Mason is a 66 year old male presenting for evaluation of possible sleep apnea.  Previous sleep testing has not been done.  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    Bedtime 10 pm  Wake time 5 am  Delayed by ~1 hour on weekends.   Consistent schedule. Occasionally will wake up spontaneously earlier than usual. Not a problem.   2-3 awakenings, pain of full bladder.     Napping on occasion during the day, has declined from 2x per day over past year following stroke.     Sleeping on floor with pads, frequent nocturia, side sleeper, wife close by.  Snoring has been noted by his wife.     No parasomnia activity reported, no RLS, no narcolepsy symptoms  Epworth Sleepiness Scale:4 out of 24 [score >11 clinically significant sleepiness].    Patient denies drowsy driving.    On primidone for ET. Since ~2001.     No family h/o snoring or sleep problems.    REVIEW OF SYSTEMS  Complete review of systems was performed and was negative except as noted above and in the sleep questionnaire.    ALLERGIES    Bee venom; Gabapentin; and Lidocaine    MEDICATIONS       Current Outpatient Prescriptions   Medication Sig Dispense Refill   . Acetaminophen 500 MG Oral Tab 2 tablets bid     . Albuterol Sulfate HFA (VENTOLIN HFA) 108 (90 Base) MCG/ACT Inhalation Aero Soln Inhale 2 puffs by mouth every 6 hours as needed for shortness of breath/wheezing. 1 Inhaler 0   . AmLODIPine Besylate 5 MG Oral Tab Take 1 tablet (5 mg) by mouth 2 times a day. 90 tablet 3   . Atorvastatin Calcium 10 MG Oral Tab Take 1 tablet (10 mg) by mouth daily. 90 tablet 0   . Budesonide 180 MCG/ACT Inhalation AEROSOL POWDER, BREATH ACTIVATED Inhale 1 puff by mouth  every 12 hours. As needed 1 Inhaler 1   . Cholecalciferol (VITAMIN D3) 2000 units Oral Cap Take 1 capsule (2,000 Units) by mouth daily. For low level 1 capsule 1   . Clobetasol Propionate 0.05 % External Ointment Small amount to rash as needed bid for 1-2 weeks at a time 15 g 1   . Cyanocobalamin 1000 MCG Oral Tab one per day over-the-counter started 5/21 /2012 this level borderline low 1 Tab 1   . Diclofenac Sodium (VOLTAREN) 1 % Transdermal Gel Apply 4 g topically 4 times a day. Apply to neck, trapezius muscle, and low back. No more than 4 g applied at a time. 1 Tube 5   . DULoxetine HCl 30 MG Oral CAPSULE ENTERIC COATED PARTICLES Take 1 capsule (30 mg) by mouth daily. 90 capsule 0   . DULoxetine HCl 60 MG Oral CAPSULE ENTERIC COATED PARTICLES Take 1 capsule (60 mg) by mouth daily. 90 capsule 0   . EPINEPHrine 0.3 MG/0.3ML Injection Solution Auto-injector Inject as instructed per patient package insert, 0.3 mg intramuscularly or subcutaneously into the thigh, if needed to treat anaphylaxis 3 each 1   . Lidocaine 5 % External Patch Apply 1 patch onto the skin daily. Apply to painful area for up  to 12 hours in a 24 hour period. 30 patch 10   . Lisinopril 20 MG Oral Tab Take 1 tablet (20 mg) by mouth every 12 hours. 180 tablet 3   . Pregabalin 25 MG Oral Cap Take 1 capsule (25 mg) by mouth 2 times a day. As needed (Patient not taking: Reported on 09/10/2016) 180 capsule 0   . Primidone 50 MG Oral Tab Take 1 tablet (50 mg) by mouth 2 times a day. 180 tablet 0   . THIAMINE HCL OR        No current facility-administered medications for this visit.         PAST MEDICAL HISTORY  Past Medical History:   Diagnosis Date   . Carotid Sinus Hypersensitivity    . HYPERTENSION     . HYPOTENSION     . Intraparenchymal hemorrhage of brain (HCC) 2017   . Pain of right hip joint 01/23/2016   . 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 Syncope    . 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(HCC)    . URI (upper  respiratory infection)      Past Surgical History:   Procedure Laterality Date   . ANES; COLONOSCOPY  2002    repeat in 3 years   . ANES; COLONOSCOPY & POLYPECTOMY  11/18/2007    repeat in 3 years   . UNLISTED PROCEDURE FEMUR/KNEE     . UNLISTED PROCEDURE HANDS/FINGERS     . UNLISTED PROCEDURE SPINE  2013    rfa to c3 to c 7      Patient Active Problem List   Diagnosis   . Syncope   . Hypotension   . Carotid Sinus Hypersensitivity   . URI (upper respiratory infection)   . Cervicalgia   . Hyperlipidemia   . Chronic pain   . Bee sting allergy   . Numbness and tingling of leg   . Chronic back pain   . B12 deficiency   . Chronic renal insufficiency, stage III (moderate)   . Back pain, lumbosacral   . Lumbar radiculopathy, chronic   . Neck pain   . Fall   . Lumbosacral radiculopathy at S1   . Tremor   . Sensory neuropathy   . 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 myelopathy or radiculopathy   . Idiopathic peripheral neuropathy   . 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)   . History of alcohol dependence (HCC)   . Cerebral ventriculomegaly   . Cerebellar hypoplasia (HCC)   . Essential hypertension   . Essential tremor   . Hx of spontan intraparenchymal intracran bleed assoc with hypertension   . Alcohol abuse   . Intraparenchymal hemorrhage of brain (HCC)   . 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 Balance problem   . Chronic pain of both shoulders   . Pain of right hip joint   . Vitamin D deficiency   . Difficulty in walking, not elsewhere classified   . Impaired mobility and ADLs   . 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[facility](HCC)     SOCIAL HISTORY  Social History     Social History   . Marital status: Married     Spouse name: N/A   . Number of children: N/A   . Years of education: N/A     Social History Main Topics   . Smoking status: Never Smoker   . Smokeless tobacco: Former NeurosurgeonUser   .  Alcohol use No   . Drug use: No   . Sexual activity: Not on file      Other Topics Concern   . Not on file     Social History Narrative    ** Merged History Encounter **          FAMILY HISTORY  Family History     Problem (# of Occurrences) Relation (Name,Age of Onset)    Colon Cancer (1) Father    Heart (other) (1) Mother: MI    Other Family Hx (1) Other: myelodysplasia        PHYSICAL EXAM  BP 127/89   Pulse 89   Ht 6\' 4"  (1.93 m)   Wt (!) 225 lb (102.1 kg) Comment: per patient  SpO2 97%   BMI 27.39 kg/m   BP Readings from Last 5 Encounters:   10/01/16 127/89   09/10/16 126/84   08/06/16 123/85   08/05/16 (!) 132/95   07/13/16 140/90     Wt Readings from Last 5 Encounters:   10/01/16 (!) 225 lb (102.1 kg)   09/10/16 (!) 236 lb 9.6 oz (107.3 kg)   08/06/16 (!) 225 lb (102.1 kg)   08/05/16 (!) 225 lb (102.1 kg)   07/13/16 (!) 226 lb 8 oz (102.7 kg)     Neck circumference 44.5 cm  GEN: NAD  HEENT: no turbinate hypertrophy was present; SD right to left, mild  Oropharyngeal exam reveals Modified Mallampati grade 2 - 3 airway without sig tonsils   CV: RRR, no m/r/g  RESP: breathing ra comfortably, CTA bilat UML lung fields  EXT: warm, dry  PSYCH: pleasant, appr affect    ASSESSMENT  Patient is 66 years old with history of hemorrhagic stroke, hypertension referred for consultation for possible sleep apnea.  Risk factors for sleep apnea include history of snoring, elevated neck circumference, crowded airway, associated medical conditions including hypertension and stroke.  We discussed the pathophysiology of sleep apnea today, associated risks including hypertension, heart attack, stroke, arryhthmia with untreated moderate to severe sleep apnea.  We reviewed the symptoms that can be associated with untreated sleep apnea including disturbed sleep, excessive daytime sleepiness, fatigue, reduced concentration and that these symptoms can improve with treatment. We also discussed the process of diagnosis via polysomnography or home sleep apnea testing.   We reviewed treatment options  for sleep apnea, including CPAP (gold standard),  weight loss, positional therapy, mandibular advancement device, and surgery as well as the option of no treatment. 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 May prefer to use MAD for treatment of OSA if present.   Patient had no questions at the end of the visit and was in full agreement with the plan.   I spent greater than 50% of the 45 minutes in counseling and coordinating care for this patient as listed in the assessment above.    PLAN  Home sleep apnea testing  Appropriate treatment and fu plans will be made after testing.       Thank you for the opportunity to participate in this patient's care.    This document has been created using voice recognition software. Therefore, it may contain errors inherent to this technology.

## 2016-10-16 ENCOUNTER — Encounter (HOSPITAL_BASED_OUTPATIENT_CLINIC_OR_DEPARTMENT_OTHER): Payer: Self-pay | Admitting: Neurology

## 2016-10-16 NOTE — Telephone Encounter (Signed)
error 

## 2016-10-19 ENCOUNTER — Encounter (HOSPITAL_BASED_OUTPATIENT_CLINIC_OR_DEPARTMENT_OTHER): Payer: PPO

## 2016-10-19 NOTE — Progress Notes (Signed)
Ronnie Mason is a 66 year old male       BP 136/64   Pulse 84   Temp 97.6 F (36.4 C) (Oral)   Ht 6\' 4"  (1.93 m)   Wt (!) 226 lb (102.5 kg)   SpO2 95%   BMI 27.51 kg/m   Chief Complaint   Patient presents with   . Medication Management     increase medication lyrica         IMPRESSION / PLAN / DISCUSSION   Ronnie Mason was seen today for medication management.    Diagnoses and all orders for this visit:    Chronic otitis externa of right ear, unspecified type  -     Triamcinolone Acetonide 0.1 % External Cream; As needed to right ear bid x 1 week    Idiopathic peripheral neuropathy  -     Pregabalin 25 MG Oral Cap; Take 1 capsule (25 mg) by mouth 3 times a day. As needed dose increase 10/20/2016         Patient is feeling much improved with his Lyrica he lives that 25 mg twice a day he has had no side effects specifically no dizziness he finds it improvement in his feet although he would like to see if he can get the discomfort in his right lateral foot to go away in general he feels no side effects and improvement we'll bump it up to 3 times per day and see him back in follow-up in 2   month    His cognition has improved  he is feeling overall much better  He is accompanied by his wife and he is no longer drinking    He has some flakiness in the right ear will go ahead and add on triamcinolone he's been using clobetasol  He will see Dr. Juel BurrowLin if his right ear flakiness does not resolve    Cc :    Return in about 2 months (around 12/20/2016).    Patient   Family Member -   express understanding of care plan/medications    Risk of taking medication properly and follow up discussed especially to call if problem  MEDICATION as of Discharge  Current Outpatient Prescriptions   Medication Sig Dispense Refill   . Acetaminophen 500 MG Oral Tab 2 tablets bid     . Albuterol Sulfate HFA (VENTOLIN HFA) 108 (90 Base) MCG/ACT Inhalation Aero Soln Inhale 2 puffs by mouth every 6 hours as needed for shortness of breath/wheezing.  (Patient not taking: Reported on 10/20/2016) 1 Inhaler 0   . AmLODIPine Besylate 5 MG Oral Tab Take 1 tablet (5 mg) by mouth 2 times a day. 90 tablet 3   . Atorvastatin Calcium 10 MG Oral Tab Take 1 tablet (10 mg) by mouth daily. 90 tablet 0   . Budesonide 180 MCG/ACT Inhalation AEROSOL POWDER, BREATH ACTIVATED Inhale 1 puff by mouth every 12 hours. As needed (Patient not taking: Reported on 10/20/2016) 1 Inhaler 1   . Cholecalciferol (VITAMIN D3) 2000 units Oral Cap Take 1 capsule (2,000 Units) by mouth daily. For low level 1 capsule 1   . Clobetasol Propionate 0.05 % External Ointment Small amount to rash as needed bid for 1-2 weeks at a time 15 g 1   . Cyanocobalamin 1000 MCG Oral Tab one per day over-the-counter started 5/21 /2012 this level borderline low 1 Tab 1   . Diclofenac Sodium (VOLTAREN) 1 % Transdermal Gel Apply 4 g topically 4 times a day.  Apply to neck, trapezius muscle, and low back. No more than 4 g applied at a time. 1 Tube 5   . DULoxetine HCl 30 MG Oral CAPSULE ENTERIC COATED PARTICLES Take 1 capsule (30 mg) by mouth daily. 90 capsule 0   . DULoxetine HCl 60 MG Oral CAPSULE ENTERIC COATED PARTICLES Take 1 capsule (60 mg) by mouth daily. 90 capsule 0   . EPINEPHrine 0.3 MG/0.3ML Injection Solution Auto-injector Inject as instructed per patient package insert, 0.3 mg intramuscularly or subcutaneously into the thigh, if needed to treat anaphylaxis 3 each 1   . Lidocaine 5 % External Patch Apply 1 patch onto the skin daily. Apply to painful area for up to 12 hours in a 24 hour period. 30 patch 10   . Lisinopril 20 MG Oral Tab Take 1 tablet (20 mg) by mouth every 12 hours. 180 tablet 3   . Pregabalin 25 MG Oral Cap Take 1 capsule (25 mg) by mouth 3 times a day. As needed dose increase 10/20/2016 270 capsule 1   . Primidone 50 MG Oral Tab Take 1 tablet (50 mg) by mouth 2 times a day. 180 tablet 0   . THIAMINE HCL OR      . Triamcinolone Acetonide 0.1 % External Cream As needed to right ear bid x 1 week 1  Tube 0     No current facility-administered medications for this visit.      ++++++++++++++++++++++++++++++++++++++++++++++++++++++++++++++  Start of visit   ++++++++++++++++++++++++++++++++++++++++++++++++++++++++++++++  Ronnie Mason is a 66 year old male                   BP 136/64   Pulse 84   Temp 97.6 F (36.4 C) (Oral)   Ht 6\' 4"  (1.93 m)   Wt (!) 226 lb (102.5 kg)   SpO2 95%   BMI 27.51 kg/m   Chief Complaint   Patient presents with   . Medication Management     increase medication lyrica       HPI  History of:    pcp [name], MD reviewed last visit  December 2017   patient last saw Dr. Aundria Rud September 10, 2016  hypertension hypotension he felt that the biggest issue for decreasing the blood pressure spike has been reduction in pain     history of intracranial bleed possibly blood pressure related   Past alcohol consumption   neuropathy possibly alcohol associated   chronic pain cervical region lumbar shoulder hip-trochanteric bursitis on the right side he is also followed by Dr. Amada Jupiter   His never smoked  Seen today with  There are no preventive care reminders to display for this patient.    Today follow-up above issues:      He has now been on Lyrica 25 mg he noticed dramatic improvement almost 100% according to the patient although he is having some residual right foot discomfort noted negative x-ray 6 2017 history of neuropathy    He is sleeping much better pain tractor  Driving has been good he is off alcohol    Has no dizziness or side effect from [place]    He is seen with his wife he is happy  In the feeling of the brain fog is gone    100 % better  Numb and painful he would like to see if he can increase his Lyrica little bit to CPAP but get additional benefit from the discomfort in his feet now mostly on the right  lateral foot      Office Visit on 07/10/16   1. VITAMIN B12 (COBALAMIN)   Result Value Ref Range    Vitamin B12 (Cobalamin) 961 (H) 180 - 914 pg/mL   2.  HEMOGLOBIN A1C, HPLC   Result Value Ref Range    Hemoglobin A1C 5.3 4.0 - 6.0 %   3. FREE LIGHT CHAINS   Result Value Ref Range    Kappa Free Light Chain 1.19 0.33 - 1.94 mg/dL    Lambda Free Light Chain 0.89 0.57 - 2.63 mg/dL    Kappa/Lambda FLC Ratio 1.34 0.26 - 1.65       MEDICATION PRIOR TO VISIT  Reports   Outpatient Medications Prior to Visit   Medication Sig Dispense Refill   . Acetaminophen 500 MG Oral Tab 2 tablets bid     . Albuterol Sulfate HFA (VENTOLIN HFA) 108 (90 Base) MCG/ACT Inhalation Aero Soln Inhale 2 puffs by mouth every 6 hours as needed for shortness of breath/wheezing. (Patient not taking: Reported on 10/20/2016) 1 Inhaler 0   . AmLODIPine Besylate 5 MG Oral Tab Take 1 tablet (5 mg) by mouth 2 times a day. 90 tablet 3   . Atorvastatin Calcium 10 MG Oral Tab Take 1 tablet (10 mg) by mouth daily. 90 tablet 0   . Budesonide 180 MCG/ACT Inhalation AEROSOL POWDER, BREATH ACTIVATED Inhale 1 puff by mouth every 12 hours. As needed (Patient not taking: Reported on 10/20/2016) 1 Inhaler 1   . Cholecalciferol (VITAMIN D3) 2000 units Oral Cap Take 1 capsule (2,000 Units) by mouth daily. For low level 1 capsule 1   . Clobetasol Propionate 0.05 % External Ointment Small amount to rash as needed bid for 1-2 weeks at a time 15 g 1   . Cyanocobalamin 1000 MCG Oral Tab one per day over-the-counter started 5/21 /2012 this level borderline low 1 Tab 1   . Diclofenac Sodium (VOLTAREN) 1 % Transdermal Gel Apply 4 g topically 4 times a day. Apply to neck, trapezius muscle, and low back. No more than 4 g applied at a time. 1 Tube 5   . DULoxetine HCl 30 MG Oral CAPSULE ENTERIC COATED PARTICLES Take 1 capsule (30 mg) by mouth daily. 90 capsule 0   . DULoxetine HCl 60 MG Oral CAPSULE ENTERIC COATED PARTICLES Take 1 capsule (60 mg) by mouth daily. 90 capsule 0   . EPINEPHrine 0.3 MG/0.3ML Injection Solution Auto-injector Inject as instructed per patient package insert, 0.3 mg intramuscularly or subcutaneously into the  thigh, if needed to treat anaphylaxis 3 each 1   . Lidocaine 5 % External Patch Apply 1 patch onto the skin daily. Apply to painful area for up to 12 hours in a 24 hour period. 30 patch 10   . Lisinopril 20 MG Oral Tab Take 1 tablet (20 mg) by mouth every 12 hours. 180 tablet 3   . Pregabalin 25 MG Oral Cap Take 1 capsule (25 mg) by mouth 2 times a day. As needed 180 capsule 0   . Primidone 50 MG Oral Tab Take 1 tablet (50 mg) by mouth 2 times a day. 180 tablet 0   . THIAMINE HCL OR        No facility-administered medications prior to visit.        REVIEW OF SYSTEMS - are otherwise negative unless as noted in HPI above or as additional issues below  Constitutional:     brain fog is improvedEyes:    Head ears, nose  mouth throat:   Cardiovascular:  No cardiovascular symptoms  Respiratory:   Gastrointestinal:   Genitourinary:    Musculoskeletal:    Integumentary:   Neurological:  Still neuropathic pain in the right lower foot  Psychiatric/Behavioral:     Endocrine:   Hematological:   Allergic/Immunologic:     PHYSICAL EXAM  BP 136/64   Pulse 84   Temp 97.6 F (36.4 C) (Oral)   Ht 6\' 4"  (1.93 m)   Wt (!) 226 lb (102.5 kg)   SpO2 95%   BMI 27.51 kg/m   Well Dressed   in normal-without any acute distress he's very happy  Constitution, In general looks:   well , good coloring  No diaphoresis  Patient is Alert  & Attentive  Patient is conversive    Psychiatric Mental Status / Neurologic Mental Status:  Affect:  Pleasant calm  Mood:  Good  Awake: Alert and attentive    Not confused  Thought::     content : no gross issue   Process : logical  Language:   Speech is fluent  Intact comprehension    Eyes: no discharge, no gross puffiness, non- icteric      Neck:    Thyroid not enlarged  2+ carotid pulses no bruits  Lymph nodes:  No gross cervical swelling  No gross axillary swelling    Cardiovascular:   Heart Regular rate , rhythm, no murmur  No edema  Respiratory:   Not tachyphpnic  Clear to ascultation  No rales,  rhonchi , without increased effort or stridor  No clubbing     Abdomen / Gastrointestinal:  Soft. No masses. No guarding. No rebound  No gross bulge or hernia. Abdomen, groin  No bruit or pulsatile mass  Genitourinary:  No suprapubic tenderness  No increased flank pain with percussion    Skin:  No gross lesion or rash    Musculoskeletal:  No gross abnormal movement, twitch   No gross atrophy or deformity  Neurologic:   Grossly intact  Memory:   Attention       Cranial Nerve    Eyes pupil and lid are intact   II, III, IV, VIII   Facial Movement VII: grossly intact, no facial droop     Motor- Gait-coordination: get up and go normal, no gross weakness   Gait- base normal appropriate for age   Get up and go -no problem    Hematologic/Lymphatic /Immunologic:  No gross bruising or rash or hives     His foot looks well there is no soreness or lesions    his skin looks nice and soft on the feet is    ++++++++++++++++++++++++++++++++++++++++++++++++++  Allergy  Review of patient's allergies indicates:  Allergies   Allergen Reactions   . Bee Venom Hives, Itching and Swelling   . Gabapentin      Flu like symptoms, diarrhea, body ache upset stomach   . Lidocaine Rash     Lidocaine patch. PATIENT STATES HE IS ALLERGIC TO THE ADHESIVE NOT THE PATCH.       ++++++++++++++++++++++++++++++++++++++++++++++++++    Patient Active Problem List    Diagnosis Date Noted   . Syncope [R55]    . Hypotension [I95.9]    . 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 URI (upper respiratory infection) [J06.9]    . 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 Impaired mobility and ADLs [Z74.09] 04/08/2016   . Difficulty in walking, not elsewhere classified [R26.2] 03/12/2016   .  Vitamin D deficiency [E55.9] 02/13/2016   . Balance problem [R26.89] 01/23/2016   . Chronic pain of both shoulders [M25.511, G89.29, M25.512] 01/23/2016     X ray arthritis  02/19/2016       . Pain of right hip joint [M25.551] 01/23/2016   . 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 Essential hypertension [I10] 12/16/2015   . Essential tremor [G25.0] 12/16/2015   . Hx of spontan intraparenchymal intracran bleed assoc with hypertension [Z86.79] 12/16/2015     [place] 4/29- 12/10/2015  Non-traumatic intraparenchymal hemorrhage, hypertensive of the left thalamus   Traumatic hemorrhage   -- falcine subdural hemorrhage, small cortical subarachnoid     . Alcohol abuse [F10.10] 12/16/2015   . 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 History of alcohol dependence (HCC) [F10.21] 06/26/2015   . 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 Idiopathic peripheral neuropathy [G60.9] 03/15/2015   . 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 Tremor [R25.1] 04/20/2013   . Lumbosacral radiculopathy at S1 [M54.17] 03/22/2013     Left     . Fall [W19.XXXA] 02/23/2013     BP < 100  Occasioanal tri[p and leg weakness     . Back pain, lumbosacral [M54.5] 02/15/2013   . Lumbar radiculopathy, chronic [M54.16] 02/15/2013   . Neck pain [M54.2] 02/15/2013   . Chronic pain [G89.29] 12/21/2012     Now at [place], DO, Santiago Glad    Neck pain -burn c3-7 on left side per patient  Some trigger injection    Lumbar pain-since 80's better with exercise     . 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 Numbness and tingling of leg [R20.0, R20.2] 12/21/2012     Left calf -left foot long term suspect from back-suspect L 45     . Chronic back pain [M54.9, G89.29] 12/21/2012     Mri in mid scape 1/214  Multilevel degenerative disc disease of the lumbar spine. Circumferential disc bulge at all levels below and including  L1-2. No significant central spinal canal stenosis or neuroforaminal narrowing.  2. Mild retrolisthesis of L5 on S1.           . B12 deficiency [E53.8] 12/21/2012     Borderline low 5 /21/2014     . Chronic renal insufficiency, stage III (moderate) [N18.3] 12/21/2012     5/ 2013     . Hyperlipidemia [E78.5] 09/26/2012   . Cervicalgia [M54.2] 03/11/2010     Radiofrequency ablation of c3 to c 7           Past Medical History:   Diagnosis Date   . Carotid Sinus Hypersensitivity    . HYPERTENSION     . HYPOTENSION     . Intraparenchymal hemorrhage of brain (HCC) 2017   . Pain of right hip joint 01/23/2016   . 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 Syncope    . 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(HCC)    . URI (upper respiratory infection)        Social History     Social History   . Marital status: Married     Spouse  name: N/A   . Number of children: N/A   . Years of education: N/A     Occupational History   . Not on file.     Social History Main Topics   . Smoking status: Never Smoker   . Smokeless tobacco: Former Neurosurgeon   . Alcohol use No   . Drug use: No   . Sexual activity: Not on file     Other Topics Concern   . Not on file     Social History Narrative    ** Merged History Encounter **              Family History     Problem (# of Occurrences) Relation (Name,Age of Onset)    Colon Cancer (1) Father    Heart (other) (1) Mother: MI    Other Family Hx (1) Other: myelodysplasia          Social History     Social History   . Marital status: Married     Spouse name: N/A   . Number of children: N/A   . Years of education: N/A     Social History Main Topics   . Smoking status: Never Smoker   . Smokeless tobacco: Former Neurosurgeon   . Alcohol use No   . Drug use: No   . Sexual activity: Not on file     Other Topics Concern   . Not on file     Social History Narrative    ** Merged History Encounter **            Health Maintenance   Topic Date Due   . Depression Screening (PHQ-2)  10/20/2017   .  Diabetes Screening  07/11/2019   . Lipid Disorders Screening  09/11/2020   . Colorectal Cancer Screening (Colonoscopy)  09/24/2020   . Tetanus Vaccine  12/22/2022   . Zoster Vaccine  Completed   . Influenza Vaccine  Completed   . Hepatitis C Screening  Addressed   . HIV Screening  Addressed

## 2016-10-20 ENCOUNTER — Ambulatory Visit (INDEPENDENT_AMBULATORY_CARE_PROVIDER_SITE_OTHER): Payer: PPO | Admitting: Internal Medicine

## 2016-10-20 VITALS — BP 136/64 | HR 84 | Temp 97.6°F | Ht 76.0 in | Wt 226.0 lb

## 2016-10-20 DIAGNOSIS — G609 Hereditary and idiopathic neuropathy, unspecified: Secondary | ICD-10-CM

## 2016-10-20 DIAGNOSIS — Z6827 Body mass index (BMI) 27.0-27.9, adult: Secondary | ICD-10-CM

## 2016-10-20 DIAGNOSIS — H6061 Unspecified chronic otitis externa, right ear: Secondary | ICD-10-CM

## 2016-10-20 MED ORDER — TRIAMCINOLONE ACETONIDE 0.1 % EX CREA
TOPICAL_CREAM | CUTANEOUS | 0 refills | Status: DC
Start: 2016-10-20 — End: 2017-09-17

## 2016-10-20 MED ORDER — PREGABALIN 25 MG OR CAPS
25.0000 mg | ORAL_CAPSULE | Freq: Three times a day (TID) | ORAL | 1 refills | Status: DC
Start: 2016-10-20 — End: 2017-01-11

## 2016-10-21 ENCOUNTER — Ambulatory Visit (HOSPITAL_BASED_OUTPATIENT_CLINIC_OR_DEPARTMENT_OTHER): Payer: PPO | Attending: Anesthesiology | Admitting: Anesthesiology

## 2016-10-21 ENCOUNTER — Encounter (HOSPITAL_BASED_OUTPATIENT_CLINIC_OR_DEPARTMENT_OTHER): Payer: Self-pay | Admitting: Anesthesiology

## 2016-10-21 VITALS — BP 142/101 | HR 92 | Temp 98.4°F | Resp 12 | Ht 76.0 in | Wt 223.0 lb

## 2016-10-21 DIAGNOSIS — G609 Hereditary and idiopathic neuropathy, unspecified: Secondary | ICD-10-CM | POA: Insufficient documentation

## 2016-10-21 DIAGNOSIS — Z6827 Body mass index (BMI) 27.0-27.9, adult: Secondary | ICD-10-CM

## 2016-10-21 DIAGNOSIS — G912 (Idiopathic) normal pressure hydrocephalus: Secondary | ICD-10-CM

## 2016-10-21 NOTE — Patient Instructions (Signed)
   Referral placed to neurology at [facility]   Agree with increasing Lyrica dose

## 2016-10-21 NOTE — Progress Notes (Signed)
[facility] CENTER FOR PAIN RELIEF CONSULTATION VISIT  10/16/2015   Ronnie Mason  [id number]    REFERRED BY:   No referring provider defined for this encounter.  PCP:  Macario Carls, MD (General)  [address] / [place] [address]    REFERRED FOR: Evaluation of low back and neck pain  with the following specific issues to be addressed:   CHIEF COMPLAINT:   No chief complaint on file.  right foot pain    HISTORY OF PRESENT ILLNESS:  Ronnie Mason is a 66 year old male who I have seen for various chronic pain issues including low back pain, hip pain, as well as shoulder pain.  He has had multiple injections in our clinic (see list below). I saw him most recently regarding new onset right sided radicular pain. I ordered an MRI and performed an L5-S1 ILESI. After his epidural, he states his pain is much improved. However, he is here secondary to progressive lateral right foot pain with accompanying difficulty walking and gait changes. He denies any loss of bowel or bladder function, denies cognitive changes. He describes the pain as constant and "almost a burning" sensation. He finds the pain problematic, but also has noted significant changes in his ability to walk due to a hard time figuring out where his foot is. Placing his foot requires quite a bit of conscious thought and planning which is new.     Injections in our clinic have included (exluding MBB and trigger point injections):  08/12/16 - L5-S1 ILESI  07/01/16 - Ultrasound guided right trochanteric bursa injection   05/20/16 - Bilateral shoulder injections  05/13/16 - Right L3 L4 L5/S1 RFA  02/19/16 -  Left L3, L4, L5/S1 RFA  12/27/12 - L5-S1 IL ESI  08/31/12 - Left L5 TFESI  03/30/12 - Left L4/5, L5/S1 facet inj  02/10/12 - Left C6 C7 MB RFA  07/03/11 - Left L3, L4, L5/S1 RFA  05/12/11 - Left C4, C5, C6 RFA    Review of Systems:  1. Constitutional: Negative, intentional wt loss of 8 pounds  2. Cardiovascular: Negative   3. Respiratory: Negative    4. MSK: As noted in HPI  5. Neuro: Denies lower extremity weakness      PAST HISTORY:  Past Medical History:   Diagnosis Date   . Carotid Sinus Hypersensitivity    . HYPERTENSION     . HYPOTENSION     . Intraparenchymal hemorrhage of brain (HCC) 2017   . Pain of right hip joint 01/23/2016   . 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 Syncope    . 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(HCC)    . URI (upper respiratory infection)      Past Surgical History:   Procedure Laterality Date   . ANES; COLONOSCOPY  2002    repeat in 3 years   . ANES; COLONOSCOPY & POLYPECTOMY  11/18/2007    repeat in 3 years   . UNLISTED PROCEDURE FEMUR/KNEE     . UNLISTED PROCEDURE HANDS/FINGERS     . UNLISTED PROCEDURE SPINE  2013    rfa to c3 to c 7      Family History     Problem (# of Occurrences) Relation (Name,Age of Onset)    Colon Cancer (1) Father    Heart (other) (1) Mother: MI    Other Family Hx (1) Other: myelodysplasia        Social History  Social History Narrative    ** Merged History Encounter **            Allergies: Bee venom; Gabapentin; and Lidocaine    Physical Exam:  General:  NAD, seated comfortably in chair  Respiratory:  Speaking in full sentences.   Neuro: strength 5/5 in lower extremities in both proximal and distal muscle groups.   MSK: He walks with a cane due to lateral right hip pain, pain with palpation over the trochanteric bursa, FABER test is positive for groin pain on the right, negative for pain on the left. St. Leg raise is positive for lateral hip pain     Past medical, surgical, family, and social history as above was reviewed and updated personally with Ronnie Mason.  Medications and allergies also reviewed and confirmed personally.    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 He also has a history of normal pressure hydrocephalus and peripheral neuropathy of unclear etiology, but past ETOH use is thought to have played a part in it.     Ronnie Mason was seen by Dr. Miguel DibbleKirschner with neurology for his normal pressure hydrocephalus and peripheral neuropathy. Dr. Miguel DibbleKirschner was monitoring his MRIs to ensure that things were stable. Ronnie Mason was instructed to arrange for follow up should he have any changes in gait, cognition, or sphincter dysfunction. It is unclear to me if his new right foot symptoms are secondary to progression of his peripheral neuropathy, radiculopathy, or hydrocephalus. I placed a referral to neurology as Ronnie Mason does not want to see Dr. Vela ProseKischner again.    I agree with increasing his Lyrica which his PCP is currently working on. I often start Pregabalin (Lyrica) at 75 mg at bedtime.  In 3 to 7 days, increase the dose of pregabalin 75 mg to twice daily. Further dose adjustments can occur every 3-7 days as needed and tolerated.  The dose can be adjusted up to a maximum of 600 mg day.  At times we suggest a higher dose at nighttime to facilitate sleep (e.g. 300 mg at hs and 150 mg in the AM). If no benefit or dose limiting persistent side effects, taper and stop.   Follow up with me PRN

## 2016-10-30 ENCOUNTER — Ambulatory Visit (HOSPITAL_BASED_OUTPATIENT_CLINIC_OR_DEPARTMENT_OTHER): Payer: PPO | Attending: Neurology

## 2016-10-30 DIAGNOSIS — G4733 Obstructive sleep apnea (adult) (pediatric): Secondary | ICD-10-CM

## 2016-11-05 ENCOUNTER — Telehealth (HOSPITAL_BASED_OUTPATIENT_CLINIC_OR_DEPARTMENT_OTHER): Payer: Self-pay | Admitting: Neurology

## 2016-11-05 NOTE — Telephone Encounter (Signed)
(  TEXTING IS AN OPTION FOR [facility] CLINICS ONLY)  Is this a [facility] clinic? No      RETURN CALL: Detailed message on voicemail only      SUBJECT:  General Message     REASON FOR REQUEST: Appointment    MESSAGE: Patient's spouse called to schedule (please see patient's [facility] Referral #[id number]). There are no scheduling instructions in referral, and patient's issue/symptom not listed in PST (TE sent per SOP).

## 2016-11-06 NOTE — Telephone Encounter (Signed)
Referral intended for [facility]; rerouted.

## 2016-11-09 ENCOUNTER — Other Ambulatory Visit: Payer: Self-pay | Admitting: Internal Medicine

## 2016-11-09 DIAGNOSIS — M542 Cervicalgia: Secondary | ICD-10-CM

## 2016-11-09 NOTE — Telephone Encounter (Signed)
The patient last received diclofenac at the requesting pharmacy on 07/28/2016 for qty #100g.

## 2016-11-10 MED ORDER — DICLOFENAC SODIUM 1 % TD GEL
2.0000 g | Freq: Four times a day (QID) | TRANSDERMAL | 5 refills | Status: DC
Start: 2016-11-10 — End: 2018-01-24

## 2016-11-12 ENCOUNTER — Telehealth (HOSPITAL_BASED_OUTPATIENT_CLINIC_OR_DEPARTMENT_OTHER): Payer: Self-pay | Admitting: Clinical

## 2016-11-12 ENCOUNTER — Encounter (HOSPITAL_BASED_OUTPATIENT_CLINIC_OR_DEPARTMENT_OTHER): Payer: Self-pay

## 2016-11-12 NOTE — Telephone Encounter (Signed)
patient

## 2016-11-12 NOTE — Telephone Encounter (Addendum)
Patients wife [name] returned clinic call please call back  [phone]

## 2016-11-12 NOTE — Telephone Encounter (Signed)
(  TEXTING IS AN OPTION FOR [facility] CLINICS ONLY)  Is this a [facility] clinic? No      RETURN CALL: Detailed message on voicemail only      SUBJECT:  Appointment Request     REASON FOR REQUEST/SYMPTOMS: hydrocepahlus  REFERRING PROVIDER: L[place]le [place] APPOINTMENT WITH: na  REQUESTED DATE: any, TIME: any  UNABLE TO APPOINT BECAUSE: no schedule instructions in referral  Please call [name]

## 2016-11-18 NOTE — Telephone Encounter (Signed)
Called pt wife back, she was at work spoke with the pt (husband)  He has been out of town,  He was unaware his wife had called and unaware of the results.  He will check [facility].

## 2016-11-23 ENCOUNTER — Other Ambulatory Visit: Payer: Self-pay

## 2016-11-23 DIAGNOSIS — M542 Cervicalgia: Secondary | ICD-10-CM

## 2016-11-23 DIAGNOSIS — M545 Low back pain, unspecified: Secondary | ICD-10-CM

## 2016-11-23 DIAGNOSIS — G894 Chronic pain syndrome: Secondary | ICD-10-CM

## 2016-11-23 DIAGNOSIS — E785 Hyperlipidemia, unspecified: Secondary | ICD-10-CM

## 2016-11-23 DIAGNOSIS — G8929 Other chronic pain: Secondary | ICD-10-CM

## 2016-11-23 MED ORDER — ATORVASTATIN CALCIUM 10 MG OR TABS
10.0000 mg | ORAL_TABLET | Freq: Every day | ORAL | 0 refills | Status: DC
Start: 2016-11-23 — End: 2017-01-11

## 2016-11-23 MED ORDER — DULOXETINE HCL 60 MG OR CPEP
60.0000 mg | DELAYED_RELEASE_CAPSULE | Freq: Every day | ORAL | 0 refills | Status: DC
Start: 2016-11-23 — End: 2017-01-11

## 2016-11-23 NOTE — Addendum Note (Signed)
Addended by: Camila Li on: 11/23/2016 08:27 AM     Modules accepted: Orders

## 2016-11-30 ENCOUNTER — Other Ambulatory Visit: Payer: Self-pay | Admitting: Internal Medicine

## 2016-11-30 ENCOUNTER — Encounter: Payer: Self-pay | Admitting: Internal Medicine

## 2016-11-30 DIAGNOSIS — M545 Low back pain, unspecified: Secondary | ICD-10-CM

## 2016-11-30 DIAGNOSIS — G250 Essential tremor: Secondary | ICD-10-CM

## 2016-11-30 DIAGNOSIS — G894 Chronic pain syndrome: Secondary | ICD-10-CM

## 2016-11-30 DIAGNOSIS — M542 Cervicalgia: Secondary | ICD-10-CM

## 2016-11-30 DIAGNOSIS — G8929 Other chronic pain: Secondary | ICD-10-CM

## 2016-11-30 NOTE — Telephone Encounter (Signed)
The patient last received this medication at the requesting pharmacy on 09/23/16.

## 2016-12-01 MED ORDER — DULOXETINE HCL 30 MG OR CPEP
30.0000 mg | DELAYED_RELEASE_CAPSULE | Freq: Every day | ORAL | 0 refills | Status: DC
Start: 2016-12-01 — End: 2017-01-11

## 2016-12-01 MED ORDER — PRIMIDONE 50 MG OR TABS
50.0000 mg | ORAL_TABLET | Freq: Two times a day (BID) | ORAL | 0 refills | Status: DC
Start: 2016-12-01 — End: 2017-03-08

## 2016-12-16 ENCOUNTER — Encounter (INDEPENDENT_AMBULATORY_CARE_PROVIDER_SITE_OTHER): Payer: PPO | Admitting: Internal Medicine

## 2016-12-29 ENCOUNTER — Other Ambulatory Visit: Payer: Self-pay | Admitting: Internal Medicine

## 2016-12-29 ENCOUNTER — Encounter (INDEPENDENT_AMBULATORY_CARE_PROVIDER_SITE_OTHER): Payer: Self-pay | Admitting: Internal Medicine

## 2016-12-29 DIAGNOSIS — M5417 Radiculopathy, lumbosacral region: Secondary | ICD-10-CM

## 2016-12-29 DIAGNOSIS — M542 Cervicalgia: Secondary | ICD-10-CM

## 2016-12-29 NOTE — Telephone Encounter (Signed)
The patient last received lidocaine patches at the requesting pharmacy on 11/24/2016 for qty #30.

## 2016-12-30 MED ORDER — LIDOCAINE 5 % EX PTCH
1.0000 | MEDICATED_PATCH | Freq: Every day | CUTANEOUS | 10 refills | Status: DC
Start: 2016-12-30 — End: 2019-07-07

## 2016-12-30 NOTE — Telephone Encounter (Signed)
Refill Request    Last visit: 10/20/2016  Next visit: 01/11/2017  Last refill: 11/28/2015  Last Prescribed VZ:DGLOVby:kraft  Labs:               If Applicable:    lastlab[papdiag:1]      Outpatient Medications Prior to Visit   Medication Sig Dispense Refill    Acetaminophen 500 MG Oral Tab 2 tablets bid      Albuterol Sulfate HFA (VENTOLIN HFA) 108 (90 Base) MCG/ACT Inhalation Aero Soln Inhale 2 puffs by mouth every 6 hours as needed for shortness of breath/wheezing. (Patient not taking: Reported on 10/20/2016) 1 Inhaler 0    AmLODIPine Besylate 5 MG Oral Tab Take 1 tablet (5 mg) by mouth 2 times a day. 90 tablet 3    Atorvastatin Calcium 10 MG Oral Tab Take 1 tablet (10 mg) by mouth daily. 90 tablet 0    Budesonide 180 MCG/ACT Inhalation AEROSOL POWDER, BREATH ACTIVATED Inhale 1 puff by mouth every 12 hours. As needed (Patient not taking: Reported on 10/20/2016) 1 Inhaler 1    Cholecalciferol (VITAMIN D3) 2000 units Oral Cap Take 1 capsule (2,000 Units) by mouth daily. For low level 1 capsule 1    Clobetasol Propionate 0.05 % External Ointment Small amount to rash as needed bid for 1-2 weeks at a time 15 g 1    Cyanocobalamin 1000 MCG Oral Tab one per day over-the-counter started 5/21 /2012 this level borderline low 1 Tab 1    Diclofenac Sodium (VOLTAREN) 1 % Transdermal Gel Apply 2 g topically 4 times a day. Apply to neck, trapezius muscle, and low back. No more than 2g applied at a time. 1 Tube 5    DULoxetine HCl 30 MG Oral CAPSULE ENTERIC COATED PARTICLES Take 1 capsule (30 mg) by mouth daily. 90 capsule 0    DULoxetine HCl 60 MG Oral CAPSULE ENTERIC COATED PARTICLES Take 1 capsule (60 mg) by mouth daily. 90 capsule 0    EPINEPHrine 0.3 MG/0.3ML Injection Solution Auto-injector Inject as instructed per patient package insert, 0.3 mg intramuscularly or subcutaneously into the thigh, if needed to treat anaphylaxis 3 each 1    Lidocaine 5 % External Patch Apply 1 patch onto the skin daily. Apply to painful area  for up to 12 hours in a 24 hour period. 30 patch 10    Lisinopril 20 MG Oral Tab Take 1 tablet (20 mg) by mouth every 12 hours. 180 tablet 3    Pregabalin 25 MG Oral Cap Take 1 capsule (25 mg) by mouth 3 times a day. As needed dose increase 10/20/2016 270 capsule 1    Primidone 50 MG Oral Tab Take 1 tablet (50 mg) by mouth 2 times a day. 180 tablet 0    THIAMINE HCL OR       Triamcinolone Acetonide 0.1 % External Cream As needed to right ear bid x 1 week 1 Tube 0     No facility-administered medications prior to visit.

## 2017-01-08 ENCOUNTER — Ambulatory Visit (HOSPITAL_BASED_OUTPATIENT_CLINIC_OR_DEPARTMENT_OTHER): Payer: PPO | Attending: Anesthesiology | Admitting: Anesthesiology

## 2017-01-08 VITALS — BP 109/76 | HR 95 | Temp 97.7°F | Resp 13 | Ht 76.0 in | Wt 225.0 lb

## 2017-01-08 DIAGNOSIS — G609 Hereditary and idiopathic neuropathy, unspecified: Secondary | ICD-10-CM | POA: Insufficient documentation

## 2017-01-08 DIAGNOSIS — Z6827 Body mass index (BMI) 27.0-27.9, adult: Secondary | ICD-10-CM

## 2017-01-10 NOTE — Progress Notes (Signed)
[facility]IEF CONSULTATION VISIT  10/16/2015   Ronnie Mason  [id number]    REFERRED BY:   No referring provider defined for this encounter.  PCP:  Ronnie Carls, MD (General)  [address] / [place] [address]    REFERRED FOR: Evaluation of low back and neck pain  with the following specific issues to be addressed:   CHIEF COMPLAINT:   Chief Complaint   Patient presents with   . Medication Management   right foot pain    HISTORY OF PRESENT ILLNESS:  Ronnie Mason is a 66 year old male  Seen by Dr. Amada Jupiter   for various chronic pain issues including low back pain, hip pain, as well as shoulder pain.  He has had multiple injections in our clinic (see list below). Dr. Amada Jupiter was not available today for his visit. Today Ronnie Mason wished to discuss next steps in medication management and the role of any additional injections to address his RIGHT leg pain and spasm. He does not spasms interference with sleep and difficult proprioception with RIGHT foot.     Injections in our clinic have included (exluding MBB and trigger point injections):  08/12/16 - L5-S1 ILESI  07/01/16 - Ultrasound guided right trochanteric bursa injection   05/20/16 - Bilateral shoulder injections  05/13/16 - Right L3 L4 L5/S1 RFA  02/19/16 -  Left L3, L4, L5/S1 RFA  12/27/12 - L5-S1 IL ESI  08/31/12 - Left L5 TFESI  03/30/12 - Left L4/5, L5/S1 facet inj  02/10/12 - Left C6 C7 MB RFA  07/03/11 - Left L3, L4, L5/S1 RFA  05/12/11 - Left C4, C5, C6 RFA      PAST HISTORY:  Past Medical History:   Diagnosis Date   . Carotid Sinus Hypersensitivity    . HYPERTENSION     . HYPOTENSION     . Intraparenchymal hemorrhage of brain (HCC) 2017   . Pain of right hip joint 01/23/2016   . 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 Syncope    . 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(HCC)    . URI (upper respiratory infection)      Past Surgical History:   Procedure Laterality Date   . ANES; COLONOSCOPY  2002    repeat in 3  years   . ANES; COLONOSCOPY & POLYPECTOMY  11/18/2007    repeat in 3 years   . UNLISTED PROCEDURE FEMUR/KNEE     . UNLISTED PROCEDURE HANDS/FINGERS     . UNLISTED PROCEDURE SPINE  2013    rfa to c3 to c 7      Family History     Problem (# of Occurrences) Relation (Name,Age of Onset)    Colon Cancer (1) Father    Heart (other) (1) Mother: MI    Other Family Hx (1) Other: myelodysplasia        Social History     Social History Narrative    ** Merged History Encounter **            Allergies: Bee venom; Gabapentin; and Lidocaine    Physical Exam:  General:  NAD, seated comfortably in chair  Respiratory:  Speaking in full sentences.   Neuro: strength 5/5 in lower extremities in both proximal and distal muscle groups.   MSK: He walks with a cane due to lateral right hip pain. Decreased light touch bilaterally LE in below the knees.    Assessment/Plan:   Mr.  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 He also has a history of normal pressure hydrocephalus and peripheral neuropathy of unclear etiology,    Appointment with neurology in AUG to address progression in RIGHT LE neuropathic symptoms.    Lyrica  can be adjusted up to a maximum of 600 mg day.   In 3 to 7 days, increase the dose of pregabalin 75 mg to twice daily. Further dose adjustments can occur every 3-7 days as needed and tolerated.  At times we suggest a higher dose at nighttime to facilitate sleep (e.g. 300 mg at hs and 150 mg in the AM). If no benefit or dose limiting persistent side effects, taper and stop.   Consider potential role for tizanidine to address LE spasm and pain   Follow up with Dr. [name] 01/22/17 to discuss potential  role of injections to address LE symtom    Time statement  I spent a total time of over  25 minutes face-to-face with the patient, of which more than 50% was spent counseling as outlined in this note.

## 2017-01-11 ENCOUNTER — Ambulatory Visit: Payer: PPO | Attending: Internal Medicine

## 2017-01-11 ENCOUNTER — Ambulatory Visit (INDEPENDENT_AMBULATORY_CARE_PROVIDER_SITE_OTHER): Payer: PPO | Admitting: Internal Medicine

## 2017-01-11 ENCOUNTER — Encounter (INDEPENDENT_AMBULATORY_CARE_PROVIDER_SITE_OTHER): Payer: Self-pay | Admitting: Internal Medicine

## 2017-01-11 VITALS — BP 126/82 | HR 95 | Ht 76.0 in | Wt 225.0 lb

## 2017-01-11 DIAGNOSIS — M542 Cervicalgia: Secondary | ICD-10-CM

## 2017-01-11 DIAGNOSIS — G8929 Other chronic pain: Secondary | ICD-10-CM

## 2017-01-11 DIAGNOSIS — M5441 Lumbago with sciatica, right side: Secondary | ICD-10-CM

## 2017-01-11 DIAGNOSIS — G609 Hereditary and idiopathic neuropathy, unspecified: Secondary | ICD-10-CM

## 2017-01-11 DIAGNOSIS — I619 Nontraumatic intracerebral hemorrhage, unspecified: Secondary | ICD-10-CM

## 2017-01-11 DIAGNOSIS — G894 Chronic pain syndrome: Secondary | ICD-10-CM

## 2017-01-11 DIAGNOSIS — E785 Hyperlipidemia, unspecified: Secondary | ICD-10-CM

## 2017-01-11 DIAGNOSIS — M1611 Unilateral primary osteoarthritis, right hip: Secondary | ICD-10-CM | POA: Insufficient documentation

## 2017-01-11 DIAGNOSIS — R69 Illness, unspecified: Secondary | ICD-10-CM

## 2017-01-11 DIAGNOSIS — M25551 Pain in right hip: Secondary | ICD-10-CM | POA: Insufficient documentation

## 2017-01-11 DIAGNOSIS — Z6827 Body mass index (BMI) 27.0-27.9, adult: Secondary | ICD-10-CM

## 2017-01-11 DIAGNOSIS — E559 Vitamin D deficiency, unspecified: Secondary | ICD-10-CM

## 2017-01-11 MED ORDER — ATORVASTATIN CALCIUM 10 MG OR TABS
10.0000 mg | ORAL_TABLET | Freq: Every day | ORAL | 1 refills | Status: DC
Start: 2017-01-11 — End: 2017-08-09

## 2017-01-11 MED ORDER — NAPROXEN SODIUM 220 MG OR CAPS
440.0000 mg | ORAL_CAPSULE | Freq: Two times a day (BID) | ORAL | 1 refills | Status: DC | PRN
Start: 2017-01-11 — End: 2017-12-17

## 2017-01-11 MED ORDER — AMLODIPINE BESYLATE 5 MG OR TABS
5.0000 mg | ORAL_TABLET | Freq: Two times a day (BID) | ORAL | 3 refills | Status: DC
Start: 2017-01-11 — End: 2017-07-21

## 2017-01-11 MED ORDER — DULOXETINE HCL 30 MG OR CPEP
30.0000 mg | DELAYED_RELEASE_CAPSULE | Freq: Every day | ORAL | 0 refills | Status: DC
Start: 2017-01-11 — End: 2017-03-08

## 2017-01-11 MED ORDER — DULOXETINE HCL 60 MG OR CPEP
60.0000 mg | DELAYED_RELEASE_CAPSULE | Freq: Every day | ORAL | 0 refills | Status: DC
Start: 2017-01-11 — End: 2017-03-08

## 2017-01-11 MED ORDER — PREGABALIN 25 MG OR CAPS
ORAL_CAPSULE | ORAL | 1 refills | Status: DC
Start: 2017-01-11 — End: 2017-02-15

## 2017-01-11 NOTE — Progress Notes (Signed)
Ronnie Mason is a 66 year old male       BP 126/82   Pulse 95   Ht 6\' 4"  (1.93 m)   Wt (!) 225 lb (102.1 kg)   SpO2 98%   BMI 27.39 kg/m   Chief Complaint   Patient presents with   . ER FOLLOW UP       IMPRESSION / PLAN / DISCUSSION   Ronnie Mason was seen today for er follow up.    Diagnoses and all orders for this visit:    Right hip pain  -     XR HIP UNILAT  W PELVIS 2-3 VW RIGHT  -     CBC, DIFF  -     COMPREHENSIVE METABOLIC PANEL  -     Naproxen Sodium 220 MG Oral Cap; Take 2 capsules (440 mg) by mouth every 12 hours as needed.  -     CREATINE KINASE TOTAL ACTIVITY    Hyperlipidemia, unspecified hyperlipidemia type  -     Atorvastatin Calcium 10 MG Oral Tab; Take 1 tablet (10 mg) by mouth daily.  -     LIPID PANEL    Intraparenchymal hemorrhage of brain (HCC)  -     AmLODIPine Besylate 5 MG Oral Tab; Take 1 tablet (5 mg) by mouth 2 times a day.    Idiopathic peripheral neuropathy  -     AmLODIPine Besylate 5 MG Oral Tab; Take 1 tablet (5 mg) by mouth 2 times a day.  -     Pregabalin 25 MG Oral Cap; 2 po bid for 1 week and then 3 tablets bid as tolerated    Chronic pain syndrome  -     DULoxetine HCl 60 MG Oral CAPSULE ENTERIC COATED PARTICLES; Take 1 capsule (60 mg) by mouth daily.  -     DULoxetine HCl 30 MG Oral CAPSULE ENTERIC COATED PARTICLES; Take 1 capsule (30 mg) by mouth daily.  -     REFERRAL TO NEUROSURGERY  -     CREATINE KINASE TOTAL ACTIVITY    Cervicalgia  -     DULoxetine HCl 60 MG Oral CAPSULE ENTERIC COATED PARTICLES; Take 1 capsule (60 mg) by mouth daily.  -     DULoxetine HCl 30 MG Oral CAPSULE ENTERIC COATED PARTICLES; Take 1 capsule (30 mg) by mouth daily.    Taking medication for chronic disease  -     CBC, DIFF  -     COMPREHENSIVE METABOLIC PANEL  -     Naproxen Sodium 220 MG Oral Cap; Take 2 capsules (440 mg) by mouth every 12 hours as needed.    Vitamin D deficiency  -     VITAMIN D (25 HYDROXY)    Chronic midline low back pain with right-sided sciatica  -     REFERRAL TO  NEUROSURGERY  -     CREATINE KINASE TOTAL ACTIVITY         patient is seen with his wife  Today we will proved on increasing his Lyrica  He is currently not appears not to have increased confusion with the 25 mg as  Twice a day  However he is having a lot of symptoms  Of discomfort in his right leg and symptoms of sciatica  Confusing this is that he also felt that he possibly popped out his right hip    I don't think that putting him on a muscle relaxant is a good idea  he is no longer  drinking alcohol   but sometimes He still gets a little bit forgetful according to his wife  They will watch for signs of confusion with Lyrica    He also has been ordered for taking his Naprosyn and Aleve  And he does not yet have stomach issues caution given    Patient requests a referral to Dr. Graciela Husbands  But I suspect he will need to have new neurology    He has not had any further episodes of falling down  And increased confusion in the setting o fintracranial hemorrhage with need to keep his amlodipine dose on the Blood pressure low    She has a history of vitamin D deficiency which can contributed to body aches check level hyperlipidemia medication is also due      Return in about 1 month (around 02/10/2017).    Patient   Family Member -   express understanding of care plan/medications    Risk of taking medication properly and follow up discussed especially to call if problem  MEDICATION as of Discharge  Current Outpatient Prescriptions   Medication Sig Dispense Refill   . Acetaminophen 500 MG Oral Tab 2 tablets bid     . Albuterol Sulfate HFA (VENTOLIN HFA) 108 (90 Base) MCG/ACT Inhalation Aero Soln Inhale 2 puffs by mouth every 6 hours as needed for shortness of breath/wheezing. 1 Inhaler 0   . AmLODIPine Besylate 5 MG Oral Tab Take 1 tablet (5 mg) by mouth 2 times a day. 90 tablet 3   . Atorvastatin Calcium 10 MG Oral Tab Take 1 tablet (10 mg) by mouth daily. 90 tablet 1   . Budesonide 180 MCG/ACT Inhalation AEROSOL POWDER,  BREATH ACTIVATED Inhale 1 puff by mouth every 12 hours. As needed (Patient not taking: Reported on 01/11/2017) 1 Inhaler 1   . Cholecalciferol (VITAMIN D3) 2000 units Oral Cap Take 1 capsule (2,000 Units) by mouth daily. For low level 1 capsule 1   . Clobetasol Propionate 0.05 % External Ointment Small amount to rash as needed bid for 1-2 weeks at a time 15 g 1   . Cyanocobalamin 1000 MCG Oral Tab one per day over-the-counter started 5/21 /2012 this level borderline low 1 Tab 1   . Diclofenac Sodium (VOLTAREN) 1 % Transdermal Gel Apply 2 g topically 4 times a day. Apply to neck, trapezius muscle, and low back. No more than 2g applied at a time. 1 Tube 5   . DULoxetine HCl 30 MG Oral CAPSULE ENTERIC COATED PARTICLES Take 1 capsule (30 mg) by mouth daily. 90 capsule 0   . DULoxetine HCl 60 MG Oral CAPSULE ENTERIC COATED PARTICLES Take 1 capsule (60 mg) by mouth daily. 90 capsule 0   . EPINEPHrine 0.3 MG/0.3ML Injection Solution Auto-injector Inject as instructed per patient package insert, 0.3 mg intramuscularly or subcutaneously into the thigh, if needed to treat anaphylaxis 3 each 1   . Lidocaine 5 % External Patch Apply 1 patch onto the skin daily. Apply to painful area for up to 12 hours in a 24 hour period. 30 patch 10   . Lisinopril 20 MG Oral Tab Take 1 tablet (20 mg) by mouth every 12 hours. 180 tablet 3   . Naproxen Sodium 220 MG Oral Cap Take 2 capsules (440 mg) by mouth every 12 hours as needed. 1 capsule 1   . Pregabalin 25 MG Oral Cap 2 po bid for 1 week and then 3 tablets bid as tolerated 540 capsule 1   .  Primidone 50 MG Oral Tab Take 1 tablet (50 mg) by mouth 2 times a day. 180 tablet 0   . THIAMINE HCL OR      . Triamcinolone Acetonide 0.1 % External Cream As needed to right ear bid x 1 week 1 Tube 0     No current facility-administered medications for this visit.      ++++++++++++++++++++++++++++++++++++++++++++++++++++++++++++++  Start of visit    ++++++++++++++++++++++++++++++++++++++++++++++++++++++++++++++  Ronnie Mason is a 66 year old male                   BP 126/82   Pulse 95   Ht 6\' 4"  (1.93 m)   Wt (!) 225 lb (102.1 kg)   SpO2 98%   BMI 27.39 kg/m   Chief Complaint   Patient presents with   . ER FOLLOW UP     HPI  History of:    pcp [name], MD reviewed last visit   March 2018 normal pressure hydrocephalus idiopathic peripheral neuropathy all possibly mixed in with a history of alcohol usage/    At that visit we initiated pregabalin had difficulty with insurance coverage initially for his neuropathy  Past alcohol consumption and in March she was no longer drinking at that visit  Chronic otitis externa     Continues to see Dr. Amada Jupiter Pain relief last visit 321 ongoing low back as well as shoulder pain multiple injections low back pain lumbar ESI reported at the visit with her the ongoing right foot pain    Patient did not wish to see Dr. Valetta Mole again Dr. Amada Jupiter put in a referral for new neurologist for the ongoing issue of the right spastic- Dr Joice Lofts      01/03/2017 last week saw Dr. Cleon Dew -for the ongoing low back and neck pain have recommendations on dosage for Lyrica--in addition to other recommendations Lyrica  can be adjusted up to a maximum of 600 mg day.   In 3 to 7 days, increase the dose of pregabalin 75 mg to twice daily. Further dose adjustments can occur every 3-7 days as needed and tolerated.  At times we suggest a higher dose at nighttime to facilitate sleep (e.g. 300 mg at hs and 150 mg in the AM). If no benefit or dose limiting persistent side effects, taper and stop.  Consider potential role for tizanidine to address LE spasm and pain    There are no preventive care reminders to display for this patient.    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 VITAMIN B12 (COBALAMIN)   Result Value Ref Range    Vitamin B12 (Cobalamin) 961 (H) 180 - 914 pg/mL   2. HEMOGLOBIN A1C, HPLC   Result Value Ref Range    Hemoglobin A1C 5.3 4.0 - 6.0 %   3. FREE LIGHT CHAINS   Result Value Ref Range  Kappa Free Light Chain 1.19 0.33 - 1.94 mg/dL    Lambda Free Light Chain 0.89 0.57 - 2.63 mg/dL    Kappa/Lambda FLC Ratio 1.34 0.26 - 1.65       MEDICATION PRIOR TO VISIT  Reports   Outpatient Medications Prior to Visit   Medication Sig Dispense Refill   . Acetaminophen 500 MG Oral Tab 2 tablets bid     . Albuterol Sulfate HFA (VENTOLIN HFA) 108 (90 Base) MCG/ACT Inhalation Aero Soln Inhale 2 puffs by mouth every 6 hours as needed for shortness of breath/wheezing. 1 Inhaler 0   . AmLODIPine Besylate 5 MG Oral Tab Take 1 tablet (5 mg) by mouth 2 times a day. 90 tablet 3   . Atorvastatin Calcium 10 MG Oral Tab Take 1 tablet (10 mg) by mouth daily. 90 tablet 0   . Budesonide 180 MCG/ACT Inhalation AEROSOL POWDER, BREATH ACTIVATED Inhale 1 puff by mouth every 12 hours. As needed (Patient not taking: Reported on 01/11/2017) 1 Inhaler 1   . Cholecalciferol (VITAMIN D3) 2000 units Oral Cap Take 1 capsule (2,000 Units) by mouth daily. For low level 1 capsule 1   . Clobetasol Propionate 0.05 % External Ointment Small amount to rash as needed bid for 1-2 weeks at a time 15 g 1   . Cyanocobalamin 1000 MCG Oral Tab one per day over-the-counter started 5/21 /2012 this level borderline low 1 Tab 1   . Diclofenac Sodium (VOLTAREN) 1 % Transdermal Gel Apply 2 g topically 4 times a day. Apply to neck, trapezius muscle, and low back. No more than 2g applied at a time. 1 Tube 5   . DULoxetine HCl 30 MG Oral CAPSULE ENTERIC COATED  PARTICLES Take 1 capsule (30 mg) by mouth daily. 90 capsule 0   . DULoxetine HCl 60 MG Oral CAPSULE ENTERIC COATED PARTICLES Take 1 capsule (60 mg) by mouth daily. 90 capsule 0   . EPINEPHrine 0.3 MG/0.3ML Injection Solution Auto-injector Inject as instructed per patient package insert, 0.3 mg intramuscularly or subcutaneously into the thigh, if needed to treat anaphylaxis 3 each 1   . Lidocaine 5 % External Patch Apply 1 patch onto the skin daily. Apply to painful area for up to 12 hours in a 24 hour period. 30 patch 10   . Lisinopril 20 MG Oral Tab Take 1 tablet (20 mg) by mouth every 12 hours. 180 tablet 3   . Pregabalin 25 MG Oral Cap Take 1 capsule (25 mg) by mouth 3 times a day. As needed dose increase 10/20/2016 270 capsule 1   . Primidone 50 MG Oral Tab Take 1 tablet (50 mg) by mouth 2 times a day. 180 tablet 0   . THIAMINE HCL OR      . Triamcinolone Acetonide 0.1 % External Cream As needed to right ear bid x 1 week 1 Tube 0     No facility-administered medications prior to visit.        REVIEW OF SYSTEMS - are otherwise negative unless as noted in HPI above or as additional issues below  Constitutional:     Eyes:    Head ears, nose mouth throat:   Cardiovascular:   Respiratory:   Gastrointestinal:   Genitourinary:    Musculoskeletal:    Integumentary:   Neurological:   Psychiatric/Behavioral:     Endocrine:   Hematological:   Allergic/Immunologic:     PHYSICAL EXAM  BP 126/82   Pulse 95   Ht 6\' 4"  (1.93 m)  Wt (!) 225 lb (102.1 kg)   SpO2 98%   BMI 27.39 kg/m   Well Dressed     Constitution, In general looks:   well , good coloring  No diaphoresis  Patient is Alert  & Attentive  Patient is conversive    Psychiatric Mental Status / Neurologic Mental Status:  Affect:  Pleasant calm  Mood:  Good  Awake: Alert and attentive    Not confused  Thought::     content : no gross issue   Process : logical  Language:   Speech is fluent  Intact comprehension    Eyes: no discharge, no gross puffiness, non-  icteric        Skin:  No gross lesion or rash    Musculoskeletal:  No gross abnormal movement, twitch   No gross atrophy or deformity  Neurologic:   Grossly intact  Memory:   Attention       Cranial Nerve    Eyes pupil and lid are intact   II, III, IV, VIII   Facial Movement VII: grossly intact, no facial droop     Motor- Gait-coordination: get up and go      Cane slow limp  Not sure what is bothering the right hip  ? Rotation  ? Pain ?   Hematologic/Lymphatic /Immunologic:  No gross bruising or rash or hives     ++++++++++++++++++++++++++++++++++++++++++++++++++  Allergy  Review of patient's allergies indicates:  Allergies   Allergen Reactions   . Bee Venom Hives, Itching and Swelling   . Gabapentin      Flu like symptoms, diarrhea, body ache upset stomach   . Lidocaine Rash     Lidocaine patch. PATIENT STATES HE IS ALLERGIC TO THE ADHESIVE NOT THE PATCH.       ++++++++++++++++++++++++++++++++++++++++++++++++++    Patient Active Problem List    Diagnosis Date Noted   . Syncope [R55]    . Hypotension [I95.9]    . 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 URI (upper respiratory infection) [J06.9]    . 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 Impaired mobility and ADLs [Z74.09] 04/08/2016   . Difficulty in walking, not elsewhere classified [R26.2] 03/12/2016   . Vitamin D deficiency [E55.9] 02/13/2016   . Balance problem [R26.89] 01/23/2016   . Chronic pain of both shoulders [M25.511, G89.29, M25.512] 01/23/2016     X ray arthritis  02/19/2016       . Pain of right hip joint [M25.551] 01/23/2016   . 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 Essential hypertension [I10] 12/16/2015   . Essential tremor [G25.0] 12/16/2015   . Hx of spontan intraparenchymal intracran bleed assoc with hypertension [Z86.79]  12/16/2015     [place] 4/29- 12/10/2015  Non-traumatic intraparenchymal hemorrhage, hypertensive of the left thalamus   Traumatic hemorrhage   -- falcine subdural hemorrhage, small cortical subarachnoid     . Alcohol abuse [F10.10] 12/16/2015   . 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 History of alcohol dependence (HCC) [F10.21] 06/26/2015   . 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 Idiopathic peripheral neuropathy [G60.9] 03/15/2015   . 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 Tremor [  R25.1] 04/20/2013   . Lumbosacral radiculopathy at S1 [M54.17] 03/22/2013     Left     . Fall [W19.XXXA] 02/23/2013     BP < 100  Occasioanal tri[p and leg weakness     . Back pain, lumbosacral [M54.5] 02/15/2013   . Lumbar radiculopathy, chronic [M54.16] 02/15/2013   . Neck pain [M54.2] 02/15/2013   . Chronic pain [G89.29] 12/21/2012     Now at [place], DO, Santiago Glad    Neck pain -burn c3-7 on left side per patient  Some trigger injection    Lumbar pain-since 80's better with exercise     . 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 Numbness and tingling of leg [R20.0, R20.2] 12/21/2012     Left calf -left foot long term suspect from back-suspect L 45     . Chronic back pain [M54.9, G89.29] 12/21/2012     Mri in mid scape 1/214  Multilevel degenerative disc disease of the lumbar spine. Circumferential disc bulge at all levels below and including L1-2. No significant central spinal canal stenosis or neuroforaminal narrowing.  2. Mild retrolisthesis of L5 on S1.           . B12 deficiency [E53.8] 12/21/2012     Borderline low 5 /21/2014     . Chronic renal insufficiency, stage III (moderate) [N18.3] 12/21/2012     5/ 2013     . Hyperlipidemia [E78.5] 09/26/2012   . Cervicalgia [M54.2] 03/11/2010     Radiofrequency ablation of  c3 to c 7           Past Medical History:   Diagnosis Date   . Carotid Sinus Hypersensitivity    . HYPERTENSION     . HYPOTENSION     . Intraparenchymal hemorrhage of brain (HCC) 2017   . Pain of right hip joint 01/23/2016   . 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 Syncope    . 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(HCC)    . URI (upper respiratory infection)        Social History     Social History   . Marital status: Married     Spouse name: N/A   . Number of children: N/A   . Years of education: N/A     Occupational History   . Not on file.     Social History Main Topics   . Smoking status: Never Smoker   . Smokeless tobacco: Former Neurosurgeon   . Alcohol use No   . Drug use: No   . Sexual activity: Not on file     Other Topics Concern   . Not on file     Social History Narrative    ** Merged History Encounter **              Family History     Problem (# of Occurrences) Relation (Name,Age of Onset)    Colon Cancer (1) Father    Heart (other) (1) Mother: MI    Other Family Hx (1) Other: myelodysplasia          Social History     Social History   . Marital status: Married     Spouse name: N/A   . Number of children: N/A   . Years of education: N/A     Social History Main Topics   . Smoking status: Never Smoker   . Smokeless tobacco: Former Neurosurgeon   .  Alcohol use No   . Drug use: No   . Sexual activity: Not on file     Other Topics Concern   . Not on file     Social History Narrative    ** Merged History Encounter **            Health Maintenance   Topic Date Due   . Depression Screening (PHQ-2)  10/20/2017   . Diabetes Screening  07/11/2019   . Lipid Disorders Screening  09/11/2020   . Colorectal Cancer Screening (Colonoscopy)  09/24/2020   . Tetanus Vaccine  11/29/2025   . Zoster Vaccine  Completed   . Influenza Vaccine  Completed   . Hepatitis C Screening  Addressed   . HIV Screening  Addressed

## 2017-01-11 NOTE — Progress Notes (Signed)
Osteoarthritis of right hip x ray report - moderate to advanced radiology read 01/11/2017

## 2017-01-22 ENCOUNTER — Encounter (HOSPITAL_BASED_OUTPATIENT_CLINIC_OR_DEPARTMENT_OTHER): Payer: PPO | Admitting: Anesthesiology

## 2017-01-24 ENCOUNTER — Emergency Department
Admission: EM | Admit: 2017-01-24 | Discharge: 2017-01-24 | Disposition: A | Discharge status: Home / Self Care | Payer: PPO | Attending: Emergency Medicine | Admitting: Emergency Medicine

## 2017-01-24 DIAGNOSIS — M25551 Pain in right hip: Secondary | ICD-10-CM

## 2017-01-24 DIAGNOSIS — I10 Essential (primary) hypertension: Secondary | ICD-10-CM | POA: Insufficient documentation

## 2017-01-24 DIAGNOSIS — M1611 Unilateral primary osteoarthritis, right hip: Secondary | ICD-10-CM | POA: Insufficient documentation

## 2017-01-24 DIAGNOSIS — Z888 Allergy status to other drugs, medicaments and biological substances status: Secondary | ICD-10-CM | POA: Insufficient documentation

## 2017-01-24 DIAGNOSIS — E785 Hyperlipidemia, unspecified: Secondary | ICD-10-CM | POA: Insufficient documentation

## 2017-02-11 ENCOUNTER — Ambulatory Visit: Payer: PPO | Attending: Internal Medicine

## 2017-02-11 DIAGNOSIS — M5441 Lumbago with sciatica, right side: Secondary | ICD-10-CM | POA: Insufficient documentation

## 2017-02-11 DIAGNOSIS — G894 Chronic pain syndrome: Secondary | ICD-10-CM | POA: Insufficient documentation

## 2017-02-11 DIAGNOSIS — G8929 Other chronic pain: Secondary | ICD-10-CM | POA: Insufficient documentation

## 2017-02-11 DIAGNOSIS — M25551 Pain in right hip: Secondary | ICD-10-CM | POA: Insufficient documentation

## 2017-02-11 DIAGNOSIS — E785 Hyperlipidemia, unspecified: Secondary | ICD-10-CM | POA: Insufficient documentation

## 2017-02-11 DIAGNOSIS — E559 Vitamin D deficiency, unspecified: Secondary | ICD-10-CM | POA: Insufficient documentation

## 2017-02-11 DIAGNOSIS — R69 Illness, unspecified: Secondary | ICD-10-CM | POA: Insufficient documentation

## 2017-02-11 LAB — COMPREHENSIVE METABOLIC PANEL
ALT (GPT): 26 U/L (ref 10–48)
AST (GOT): 27 U/L (ref 9–38)
Albumin: 4.3 g/dL (ref 3.5–5.2)
Alkaline Phosphatase (Total): 91 U/L (ref 37–159)
Anion Gap: 8 (ref 4–12)
Bilirubin (Total): 0.5 mg/dL (ref 0.2–1.3)
Calcium: 9.3 mg/dL (ref 8.9–10.2)
Carbon Dioxide, Total: 27 meq/L (ref 22–32)
Chloride: 103 meq/L (ref 98–108)
Creatinine: 1.23 mg/dL — ABNORMAL HIGH (ref 0.51–1.18)
GFR, Calc, African American: >60 mL/min/{1.73_m2} (ref >59)
GFR, Calc, European American: 59 mL/min/{1.73_m2} — ABNORMAL LOW (ref >59)
Glucose: 107 mg/dL (ref 62–125)
Potassium: 4.8 meq/L (ref 3.6–5.2)
Protein (Total): 6.9 g/dL (ref 6.0–8.2)
Sodium: 138 meq/L (ref 135–145)
Urea Nitrogen: 26 mg/dL — ABNORMAL HIGH (ref 8–21)

## 2017-02-11 LAB — LIPID PANEL
Cholesterol (LDL): 70 mg/dL (ref <130)
Cholesterol/HDL Ratio: 1.9
HDL Cholesterol: 106 mg/dL (ref >39)
Non-HDL Cholesterol: 96 mg/dL (ref 0–159)
Total Cholesterol: 202 mg/dL — ABNORMAL HIGH (ref <200)
Triglyceride: 131 mg/dL (ref <150)

## 2017-02-11 LAB — CBC, DIFF
% Basophils: 1 %
% Eosinophils: 4 %
% Immature Granulocytes: 0 %
% Lymphocytes: 28 %
% Monocytes: 13 %
% Neutrophils: 54 %
% Nucleated RBC: 0 %
Absolute Eosinophil Count: 0.18 10*3/uL (ref 0.00–0.50)
Absolute Lymphocyte Count: 1.27 10*3/uL (ref 1.00–4.80)
Basophils: 0.05 10*3/uL (ref 0.00–0.20)
Hematocrit: 43 % (ref 38–50)
Hemoglobin: 14.3 g/dL (ref 13.0–18.0)
Immature Granulocytes: 0.01 10*3/uL (ref 0.00–0.05)
MCH: 34 pg — ABNORMAL HIGH (ref 27.3–33.6)
MCHC: 33 g/dL (ref 32.2–36.5)
MCV: 103 fL — ABNORMAL HIGH (ref 81–98)
Monocytes: 0.56 10*3/uL (ref 0.00–0.80)
Neutrophils: 2.4 10*3/uL (ref 1.80–7.00)
Nucleated RBC: 0 10*3/uL
Platelet Count: 206 10*3/uL (ref 150–400)
RBC: 4.21 10*6/uL — ABNORMAL LOW (ref 4.40–5.60)
RDW-CV: 13.7 % (ref 11.6–14.4)
WBC: 4.47 10*3/uL (ref 4.3–10.0)

## 2017-02-11 LAB — CK, CREATINE KINASE, TOTAL ACTIVITY: Creatine Kinase Total Activity: 134 U/L (ref 62–325)

## 2017-02-11 NOTE — Progress Notes (Signed)
Labs are stable   Mild decreased in kidney function   Could be you are a little dehydrated ?                                                        Office Visit on 01/11/17  1. CBC, DIFF       Result                                            Value                         Ref Range                       WBC                                               4.47                          4.3 - 10.0 10*3/uL              RBC                                               4.21 (L)                      4.40 - 5.60 10*6/uL             Hemoglobin                                        14.3                          13.0 - 18.0 g/dL                Hematocrit                                        43                            38 - 50 %                       MCV                                               103 (H)  81 - 98 fL                      MCH                                               34.0 (H)                      27.3 - 33.6 pg                  MCHC                                              33.0                          32.2 - 36.5 g/dL                Platelet Count                                    206                           150 - 400 10*3/uL               RDW-CV                                            13.7                          11.6 - 14.4 %                   % Neutrophils                                     54                            %                               % Lymphocytes                                     28                            %                               % Monocytes  13                            %                               % Eosinophils                                     4                             %                               % Basophils                                       1                             %                               % Immature Granulocytes                           0                              %                               Neutrophils                                       2.40                          1.80 - 7.00 10*3/uL             Absolute Lymphocyte Count                         1.27                          1.00 - 4.80 10*3/uL             Monocytes                                         0.56                          0.00 - 0.80 10*3/uL             Absolute Eosinophil Count  0.18                          0.00 - 0.50 10*3/uL             Basophils                                         0.05                          0.00 - 0.20 10*3/uL             Immature Granulocytes                             0.01                          0.00 - 0.05 10*3/uL             Nucleated RBC                                     0.00                          0.00 10*3/uL                    % Nucleated RBC                                   0                             %                          2. COMPREHENSIVE METABOLIC PANEL       Result                                            Value                         Ref Range                       Sodium                                            138                           135 - 145 meq/L                 Potassium  4.8                           3.6 - 5.2 meq/L                 Chloride                                          103                           98 - 108 meq/L                  Carbon Dioxide, Total                             27                            22 - 32 meq/L                   Anion Gap                                         8                             4 - 12                          Glucose                                           107                           62 - 125 mg/dL                  Urea Nitrogen                                     26 (H)                        8 - 21 mg/dL                    Creatinine                                        1.23 (H)                      0.51 -  1.18 mg/dL               Protein (Total)  6.9                           6.0 - 8.2 g/dL                  Albumin                                           4.3                           3.5 - 5.2 g/dL                  Bilirubin (Total)                                 0.5                           0.2 - 1.3 mg/dL                 Calcium                                           9.3                           8.9 - 10.2 mg/dL                AST (GOT)                                         27                            9 - 38 U/L                      Alkaline Phosphatase (Total)                      91                            37 - 159 U/L                    ALT (GPT)                                         26                            10 - 48 U/L                     GFR, Calc, European American                      59 (L)                        >  59 mL/min/[1.73_m2]            GFR, Calc, African American                       >60                           >59 mL/min/[1.73_m2]            GFR, Information                                                                                            Calculated GFR in mL/min/1.73 m2 by MDRD equation.  Inaccurate with changing renal function.  See http://depts.[email or web address].html  3. LIPID PANEL       Result                                            Value                         Ref Range                       Cholesterol (Total)                               202 (H)                       <200 mg/dL                      Triglyceride                                      131                           <150 mg/dL                      Cholesterol (HDL)                                 106                           >39 mg/dL                       Cholesterol (LDL)                                 70                            <  130 mg/dL                      Non-HDL Cholesterol                               96                             0 - 159 mg/dL                   Cholesterol/HDL Ratio                             1.9                                                           Lipid Panel, Additional Info.                     (NOTE)                                                   4. CREATINE KINASE TOTAL ACTIVITY       Result                                            Value                         Ref Range                       Creatine Kinase Total Activity                    134                           62 - 325 U/L

## 2017-02-14 NOTE — Progress Notes (Signed)
Ronnie GranaDavid Paul Mason is a 66 year old male       BP 126/80   Pulse 90   Ht 6\' 4"  (1.93 m)   Wt (!) 222 lb (100.7 kg)   SpO2 98%   BMI 27.02 kg/m   Chief Complaint   Patient presents with   . Medication Management   . Leg Pain     increasing lyrica help nerve pain . Seeing Dr.Klein possible surgery        IMPRESSION / PLAN / DISCUSSION   (M25.551) Right hip pain  (primary encounter diagnosis)  (G60.9) Idiopathic peripheral neuropathy  (I10) Essential hypertension     Ronnie HuaDavid was seen today for medication management and leg pain.    Diagnoses and all orders for this visit:    Right hip pain  -     MRI HIP RIGHT W/O CONTRAST    Idiopathic peripheral neuropathy  -     Pregabalin 25 MG Oral Cap; 4 po bid 02/15/2017    Essential hypertension        He now wants mri of his hip  He declined this in er  He did not tell his wife he went to er   He does have djd of hip and hard to what is causing pain or the back , will be seeing Dr Graciela HusbandsKlein   No controlled substance for him as he is at risk for falling   Increased lyrica may be safest med but montior for dizziness - was ok until he added antihistamine    - I ask that he again not self medicate    bp no change      No long drinking  Has been busy building his house       No Follow-up on file.    Patient   Family Member -   express understanding of care plan/medications    Risk of taking medication properly and follow up discussed especially to call if problem  MEDICATION as of Discharge  Current Outpatient Prescriptions   Medication Sig Dispense Refill   . Acetaminophen 500 MG Oral Tab 2 tablets bid     . Albuterol Sulfate HFA (VENTOLIN HFA) 108 (90 Base) MCG/ACT Inhalation Aero Soln Inhale 2 puffs by mouth every 6 hours as needed for shortness of breath/wheezing. 1 Inhaler 0   . AmLODIPine Besylate 5 MG Oral Tab Take 1 tablet (5 mg) by mouth 2 times a day. 90 tablet 3   . Atorvastatin Calcium 10 MG Oral Tab Take 1 tablet (10 mg) by mouth daily. 90 tablet 1   . Budesonide 180  MCG/ACT Inhalation AEROSOL POWDER, BREATH ACTIVATED Inhale 1 puff by mouth every 12 hours. As needed (Patient not taking: Reported on 01/11/2017) 1 Inhaler 1   . Cholecalciferol (VITAMIN D3) 2000 units Oral Cap Take 1 capsule (2,000 Units) by mouth daily. For low level 1 capsule 1   . Clobetasol Propionate 0.05 % External Ointment Small amount to rash as needed bid for 1-2 weeks at a time 15 g 1   . Cyanocobalamin 1000 MCG Oral Tab one per day over-the-counter started 5/21 /2012 this level borderline low 1 Tab 1   . Diclofenac Sodium (VOLTAREN) 1 % Transdermal Gel Apply 2 g topically 4 times a day. Apply to neck, trapezius muscle, and low back. No more than 2g applied at a time. 1 Tube 5   . DULoxetine HCl 30 MG Oral CAPSULE ENTERIC COATED PARTICLES Take 1 capsule (30 mg) by  mouth daily. 90 capsule 0   . DULoxetine HCl 60 MG Oral CAPSULE ENTERIC COATED PARTICLES Take 1 capsule (60 mg) by mouth daily. 90 capsule 0   . EPINEPHrine 0.3 MG/0.3ML Injection Solution Auto-injector Inject as instructed per patient package insert, 0.3 mg intramuscularly or subcutaneously into the thigh, if needed to treat anaphylaxis 3 each 1   . Lidocaine 5 % External Patch Apply 1 patch onto the skin daily. Apply to painful area for up to 12 hours in a 24 hour period. 30 patch 10   . Lisinopril 20 MG Oral Tab Take 1 tablet (20 mg) by mouth every 12 hours. 180 tablet 3   . Naproxen Sodium 220 MG Oral Cap Take 2 capsules (440 mg) by mouth every 12 hours as needed. 1 capsule 1   . Pregabalin 25 MG Oral Cap 4 po bid 02/15/2017 540 capsule 1   . Primidone 50 MG Oral Tab Take 1 tablet (50 mg) by mouth 2 times a day. 180 tablet 0   . THIAMINE HCL OR      . Triamcinolone Acetonide 0.1 % External Cream As needed to right ear bid x 1 week 1 Tube 0     No current facility-administered medications for this visit.      ++++++++++++++++++++++++++++++++++++++++++++++++++++++++++++++  Start of visit    ++++++++++++++++++++++++++++++++++++++++++++++++++++++++++++++  Ronnie Mason is a 66 year old male                   BP 126/80   Pulse 90   Ht 6\' 4"  (1.93 m)   Wt (!) 222 lb (100.7 kg)   SpO2 98%   BMI 27.02 kg/m   Chief Complaint   Patient presents with   . Medication Management   . Leg Pain     increasing lyrica help nerve pain . 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 R parafalcine SAH likely related to trauma sustained on the day of presentation     Hydrocephalus                                 Was in er 01/24/2017   Hip -Notes recorded by [name], MD on 01/11/2017 at 6:18 PM PDT  Osteoarthritis of right hip x ray report - moderate to advanced radiology read 01/11/2017        Seen today with  There are no preventive care reminders to display for this patient.    Today follow-up above issues:     Dr Graciela Husbands pending on back    He said Dr Amada Jupiter - know on hep    Sleep ok until spasm    He increase by self  the lyirca 4 tablet and was ok until he took an antihistamine and  felt loopy   then back to 25 mg tab as 75 mg bid  Again and felt ok   Allergy sneeze now but dose back to 75 mg bid   Right hip hurt a lot       Office Visit on 01/11/17   1. CBC, DIFF   Result Value Ref Range    WBC 4.47 4.3 - 10.0 10*3/uL    RBC 4.21 (L) 4.40 - 5.60 10*6/uL    Hemoglobin 14.3 13.0 - 18.0 g/dL    Hematocrit 43 38 - 50 %    MCV 103  (H) 81 - 98 fL    MCH 34.0 (H) 27.3 - 33.6 pg    MCHC 33.0 32.2 - 36.5 g/dL    Platelet Count 841 324 - 400 10*3/uL    RDW-CV 13.7 11.6 - 14.4 %    % Neutrophils 54 %    % Lymphocytes 28 %    % Monocytes 13 %    % Eosinophils 4 %    % Basophils 1 %    % Immature Granulocytes 0 %    Neutrophils 2.40 1.80 - 7.00 10*3/uL    Absolute Lymphocyte Count 1.27 1.00 - 4.80 10*3/uL    Monocytes 0.56 0.00 - 0.80 10*3/uL    Absolute Eosinophil Count 0.18 0.00 - 0.50 10*3/uL    Basophils 0.05 0.00 - 0.20 10*3/uL    Immature Granulocytes 0.01 0.00 - 0.05 10*3/uL    Nucleated RBC 0.00 0.00 10*3/uL    % Nucleated RBC 0 %   2. COMPREHENSIVE METABOLIC PANEL   Result Value Ref Range    Sodium 138 135 - 145 meq/L    Potassium 4.8 3.6 - 5.2 meq/L    Chloride 103 98 - 108 meq/L    Carbon Dioxide, Total 27 22 - 32 meq/L    Anion Gap 8 4 - 12    Glucose 107 62 - 125 mg/dL    Urea Nitrogen 26 (H) 8 - 21 mg/dL    Creatinine 4.01 (H) 0.51 - 1.18 mg/dL    Protein (Total) 6.9 6.0 - 8.2 g/dL    Albumin 4.3 3.5 - 5.2 g/dL    Bilirubin (Total) 0.5 0.2 - 1.3 mg/dL    Calcium 9.3 8.9 - 02.7 mg/dL    AST (GOT) 27 9 - 38 U/L    Alkaline Phosphatase (Total) 91 37 - 159 U/L    ALT (GPT) 26 10 - 48 U/L    GFR, Calc, European American 59 (L) >59 mL/min/[1.73_m2]    GFR, Calc, African American >60 >59 mL/min/[1.73_m2]    GFR, Information       Calculated GFR in mL/min/1.73 m2 by MDRD equation.  Inaccurate with changing renal function.  See http://depts.[email or web address].html   3. VITAMIN D (25 HYDROXY)   Result Value Ref Range    Vitamin D2 (25_Hydroxy) <1.0 ng/mL    Vitamin D3 (25_Hydroxy) 47.5 ng/mL    Vit D (25_Hydroxy) Total 47.5 20.1 - 50.0 ng/mL    Vit D (25_Hydroxy) Interp Normal:            20.1-50.0 ng/mL    4. LIPID PANEL   Result Value Ref Range    Cholesterol (Total) 202 (H) <200 mg/dL    Triglyceride 253 <664 mg/dL    Cholesterol (HDL) 403 >39 mg/dL    Cholesterol (LDL) 70 <130 mg/dL    Non-HDL Cholesterol 96 0 - 159 mg/dL     Cholesterol/HDL Ratio 1.9     Lipid Panel, Additional  Info. (NOTE)    5. CREATINE KINASE TOTAL ACTIVITY   Result Value Ref Range    Creatine Kinase Total Activity 134 62 - 325 U/L       MEDICATION PRIOR TO VISIT  Reports   Outpatient Medications Prior to Visit   Medication Sig Dispense Refill   . Acetaminophen 500 MG Oral Tab 2 tablets bid     . Albuterol Sulfate HFA (VENTOLIN HFA) 108 (90 Base) MCG/ACT Inhalation Aero Soln Inhale 2 puffs by mouth every 6 hours as needed for shortness of breath/wheezing. 1 Inhaler 0   . AmLODIPine Besylate 5 MG Oral Tab Take 1 tablet (5 mg) by mouth 2 times a day. 90 tablet 3   . Atorvastatin Calcium 10 MG Oral Tab Take 1 tablet (10 mg) by mouth daily. 90 tablet 1   . Budesonide 180 MCG/ACT Inhalation AEROSOL POWDER, BREATH ACTIVATED Inhale 1 puff by mouth every 12 hours. As needed (Patient not taking: Reported on 01/11/2017) 1 Inhaler 1   . Cholecalciferol (VITAMIN D3) 2000 units Oral Cap Take 1 capsule (2,000 Units) by mouth daily. For low level 1 capsule 1   . Clobetasol Propionate 0.05 % External Ointment Small amount to rash as needed bid for 1-2 weeks at a time 15 g 1   . Cyanocobalamin 1000 MCG Oral Tab one per day over-the-counter started 5/21 /2012 this level borderline low 1 Tab 1   . Diclofenac Sodium (VOLTAREN) 1 % Transdermal Gel Apply 2 g topically 4 times a day. Apply to neck, trapezius muscle, and low back. No more than 2g applied at a time. 1 Tube 5   . DULoxetine HCl 30 MG Oral CAPSULE ENTERIC COATED PARTICLES Take 1 capsule (30 mg) by mouth daily. 90 capsule 0   . DULoxetine HCl 60 MG Oral CAPSULE ENTERIC COATED PARTICLES Take 1 capsule (60 mg) by mouth daily. 90 capsule 0   . EPINEPHrine 0.3 MG/0.3ML Injection Solution Auto-injector Inject as instructed per patient package insert, 0.3 mg intramuscularly or subcutaneously into the thigh, if needed to treat anaphylaxis 3 each 1   . Lidocaine 5 % External Patch Apply 1 patch onto the skin daily. Apply to painful  area for up to 12 hours in a 24 hour period. 30 patch 10   . Lisinopril 20 MG Oral Tab Take 1 tablet (20 mg) by mouth every 12 hours. 180 tablet 3   . Naproxen Sodium 220 MG Oral Cap Take 2 capsules (440 mg) by mouth every 12 hours as needed. 1 capsule 1   . Pregabalin 25 MG Oral Cap 2 po bid for 1 week and then 3 tablets bid as tolerated 540 capsule 1   . Primidone 50 MG Oral Tab Take 1 tablet (50 mg) by mouth 2 times a day. 180 tablet 0   . THIAMINE HCL OR      . Triamcinolone Acetonide 0.1 % External Cream As needed to right ear bid x 1 week 1 Tube 0     No facility-administered medications prior to visit.        REVIEW OF SYSTEMS - are otherwise negative unless as noted in HPI above or as additional issues below  Constitutional:     Eyes:    Head ears, nose mouth throat:   Cardiovascular:   Respiratory:   Gastrointestinal:   Genitourinary:    Musculoskeletal:  Right leg pain better w rest   Integumentary:   Neurological:   Psychiatric/Behavioral:     Endocrine:  Hematological:   Allergic/Immunologic:     PHYSICAL EXAM  BP 126/80   Pulse 90   Ht 6\' 4"  (1.93 m)   Wt (!) 222 lb (100.7 kg)   SpO2 98%   BMI 27.02 kg/m   Well Dressed     Constitution, In general looks:   well , good coloring  No diaphoresis  Patient is Alert  & Attentive  Patient is conversive    Psychiatric Mental Status / Neurologic Mental Status:  Affect:  Pleasant calm  Mood:  Good  Awake: Alert and attentive    Not confused  Thought::     content : no gross issue   Process : logical  Language:   Speech is fluent  Intact comprehension    Eyes: no discharge, no gross puffiness, non- icteric      Cardiovascular:   Heart Regular rate , rhythm, no murmur  No edema  Respiratory:   Not tachyphpnic  Clear to ascultation  No rales, rhonchi , without increased effort or stridor  No clubbing       Skin:  No gross lesion or rash    Musculoskeletal:  rom right hip ? Hard to tell if pain from back or hip   Neurologic:   Grossly intact  Memory:    Attention       Cranial Nerve    Eyes pupil and lid are intact   II, III, IV, VIII   Facial Movement VII: grossly intact, no facial droop      Hematologic/Lymphatic /Immunologic:  No gross bruising or rash or hives     ++++++++++++++++++++++++++++++++++++++++++++++++++  Allergy  Review of patient's allergies indicates:  Allergies   Allergen Reactions   . Bee Venom Hives, Itching and Swelling   . Gabapentin      Flu like symptoms, diarrhea, body ache upset stomach   . Lidocaine Rash     Lidocaine patch. PATIENT STATES HE IS ALLERGIC TO THE ADHESIVE NOT THE PATCH.       ++++++++++++++++++++++++++++++++++++++++++++++++++    Patient Active Problem List    Diagnosis Date Noted   . Syncope [R55]    . Hypotension [I95.9]    . 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 URI (upper respiratory infection) [J06.9]    . 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] 01/11/2017     Osteoarthritis of right hip x ray report - moderate to advanced radiology read 01/11/2017     . 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 Impaired mobility and ADLs [Z74.09] 04/08/2016   . Difficulty in walking, not elsewhere classified [R26.2] 03/12/2016   . Vitamin D deficiency [E55.9] 02/13/2016   . Balance problem [R26.89] 01/23/2016   . Chronic pain of both shoulders [M25.511, G89.29, M25.512] 01/23/2016     X ray arthritis  02/19/2016       . Pain of right hip joint [M25.551] 01/23/2016   . 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 Essential hypertension [I10] 12/16/2015   . Essential tremor [G25.0] 12/16/2015   . Hx of spontan intraparenchymal intracran bleed assoc with hypertension [Z86.79] 12/16/2015     [place] 4/29- 12/10/2015  Non-traumatic intraparenchymal hemorrhage, hypertensive of the left thalamus   Traumatic  hemorrhage   -- falcine subdural hemorrhage, small cortical subarachnoid     . Alcohol abuse [F10.10] 12/16/2015   .  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 History of alcohol dependence (HCC) [F10.21] 06/26/2015   . 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 Idiopathic peripheral neuropathy [G60.9] 03/15/2015   . 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 Tremor [R25.1] 04/20/2013   . Lumbosacral radiculopathy at S1 [M54.17] 03/22/2013     Left     . Fall [W19.XXXA] 02/23/2013     BP < 100  Occasioanal tri[p and leg weakness     . Back pain, lumbosacral [M54.5] 02/15/2013   . Lumbar radiculopathy, chronic [M54.16] 02/15/2013   . Neck pain [M54.2] 02/15/2013   . Chronic pain [G89.29] 12/21/2012     Now at [facility], DO, Santiago Glad    Neck pain -burn c3-7 on left side per patient  Some trigger injection    Lumbar pain-since 80's better with exercise     . 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 Numbness and tingling of leg [R20.0, R20.2] 12/21/2012     Left calf -left foot long term suspect from back-suspect L 45     . Chronic back pain [M54.9, G89.29] 12/21/2012     Mri in mid sc[facility] 1/214  Multilevel degenerative disc disease of the lumbar spine. Circumferential disc bulge at all levels below and including L1-2. No significant central spinal canal stenosis or neuroforaminal narrowing.  2. Mild retrolisthesis of L5 on S1.           . B12 deficiency [E53.8] 12/21/2012     Borderline low 5 /21/2014     . Chronic renal insufficiency, stage III (moderate) [N18.3] 12/21/2012     5/ 2013     . Hyperlipidemia [E78.5] 09/26/2012   . Cervicalgia [M54.2] 03/11/2010     Radiofrequency ablation of c3 to c 7           Past Medical History:   Diagnosis Date   . Carotid Sinus Hypersensitivity    . HYPERTENSION     . HYPOTENSION     .  Intraparenchymal hemorrhage of brain (HCC) 2017   . Pain of right hip joint 01/23/2016   . 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 Syncope    . 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(HCC)    . URI (upper respiratory infection)        Social History     Social History   . Marital status: Married     Spouse name: N/A   . Number of children: N/A   . Years of education: N/A     Occupational History   . Not on file.     Social History Main Topics   . Smoking status: Never Smoker   . Smokeless tobacco: Former Neurosurgeon   . Alcohol use No   . Drug use: No   . Sexual activity: Not on file     Other Topics Concern   . Not on file     Social History Narrative    ** Merged History Encounter **              Family History     Problem (# of Occurrences) Relation (Name,Age of Onset)    Colon Cancer (1) Father    Heart (other) (1) Mother: MI    Other Family Hx (1) Other: myelodysplasia  Social History     Social History   . Marital status: Married     Spouse name: N/A   . Number of children: N/A   . Years of education: N/A     Social History Main Topics   . Smoking status: Never Smoker   . Smokeless tobacco: Former Neurosurgeon   . Alcohol use No   . Drug use: No   . Sexual activity: Not on file     Other Topics Concern   . Not on file     Social History Narrative    ** Merged History Encounter **            Health Maintenance   Topic Date Due   . Influenza Vaccine (1) 05/03/2017   . Depression Screening (PHQ-2)  02/15/2018   . Diabetes Screening  02/12/2020   . Colorectal Cancer Screening (Colonoscopy)  09/24/2020   . Lipid Disorders Screening  02/11/2022   . Tetanus Vaccine  11/29/2025   . Zoster Vaccine  Completed   . Hepatitis C Screening  Addressed   . HIV Screening  Addressed

## 2017-02-15 ENCOUNTER — Ambulatory Visit (INDEPENDENT_AMBULATORY_CARE_PROVIDER_SITE_OTHER): Payer: PPO | Admitting: Neurological Surgery

## 2017-02-15 ENCOUNTER — Encounter (HOSPITAL_BASED_OUTPATIENT_CLINIC_OR_DEPARTMENT_OTHER): Payer: PPO | Admitting: Anesthesiology

## 2017-02-15 ENCOUNTER — Ambulatory Visit (INDEPENDENT_AMBULATORY_CARE_PROVIDER_SITE_OTHER): Payer: PPO | Admitting: Internal Medicine

## 2017-02-15 VITALS — BP 126/85 | HR 86 | Ht 76.0 in | Wt 222.0 lb

## 2017-02-15 VITALS — BP 126/80 | HR 90 | Ht 76.0 in | Wt 222.0 lb

## 2017-02-15 DIAGNOSIS — G609 Hereditary and idiopathic neuropathy, unspecified: Secondary | ICD-10-CM

## 2017-02-15 DIAGNOSIS — M5416 Radiculopathy, lumbar region: Secondary | ICD-10-CM

## 2017-02-15 DIAGNOSIS — Z6827 Body mass index (BMI) 27.0-27.9, adult: Secondary | ICD-10-CM

## 2017-02-15 DIAGNOSIS — I10 Essential (primary) hypertension: Secondary | ICD-10-CM

## 2017-02-15 DIAGNOSIS — M25551 Pain in right hip: Secondary | ICD-10-CM

## 2017-02-15 LAB — VITAMIN D (25 HYDROXY)
Vit D (25_Hydroxy) Total: 47.5 ng/mL (ref 20.1–50.0)
Vitamin D2 (25_Hydroxy): <1 ng/mL
Vitamin D3 (25_Hydroxy): 47.5 ng/mL

## 2017-02-15 MED ORDER — PREGABALIN 25 MG OR CAPS
ORAL_CAPSULE | ORAL | 1 refills | Status: DC
Start: 2017-02-15 — End: 2017-05-21

## 2017-02-15 NOTE — Progress Notes (Deleted)
[facility] CENTER FOR PAIN RELIEF CONSULTATION VISIT  10/16/2015   Ronnie Mason  [id number]    REFERRED BY:   No referring provider defined for this encounter.  PCP:  Macario Carls, MD (General)  [address] / [place] [address]    REFERRED FOR: Evaluation of low back and neck pain  with the following specific issues to be addressed:   CHIEF COMPLAINT:   No chief complaint on file.  right hip pain    HISTORY OF PRESENT ILLNESS:  Ronnie Mason is a 66 year old male  Seen by Dr. Lillie Columbia for follow up 01/08/17 while I was out of the office. He is seen for various chronic pain issues including low back pain, hip pain, as well as shoulder pain.  He has had multiple injections in our clinic (see list below). During that visit, Mr. Greek wished to discuss next steps in medication management and the role of any additional injections to address his RIGHT leg pain and spasm. It was recommended that his Lyrica be titrated up and that he might try tizanidine to help with muscle spasms.    Injections in our clinic have included (exluding MBB and trigger point injections):  08/12/16 - L5-S1 ILESI  07/01/16 - Ultrasound guided right trochanteric bursa injection   05/20/16 - Bilateral shoulder injections  05/13/16 - Right L3 L4 L5/S1 RFA  02/19/16 -  Left L3, L4, L5/S1 RFA  12/27/12 - L5-S1 IL ESI  08/31/12 - Left L5 TFESI  03/30/12 - Left L4/5, L5/S1 facet inj  02/10/12 - Left C6 C7 MB RFA  07/03/11 - Left L3, L4, L5/S1 RFA  05/12/11 - Left C4, C5, C6 RFA      PAST HISTORY:  Past Medical History:   Diagnosis Date   . Carotid Sinus Hypersensitivity    . HYPERTENSION     . HYPOTENSION     . Intraparenchymal hemorrhage of brain (HCC) 2017   . Pain of right hip joint 01/23/2016   . 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 Syncope    . 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(HCC)    . URI (upper respiratory infection)      Past Surgical History:   Procedure Laterality Date   . ANES; COLONOSCOPY  2002    repeat in 3 years   . ANES; COLONOSCOPY & POLYPECTOMY  11/18/2007    repeat in 3 years   . UNLISTED PROCEDURE FEMUR/KNEE     . UNLISTED PROCEDURE HANDS/FINGERS     . UNLISTED PROCEDURE SPINE  2013    rfa to c3 to c 7      Family History     Problem (# of Occurrences) Relation (Name,Age of Onset)    Colon Cancer (1) Father    Heart (other) (1) Mother: MI    Other Family Hx (1) Other: myelodysplasia        Social History     Social History Narrative    ** Merged History Encounter **            Allergies: Bee venom; Gabapentin; and Lidocaine    Physical Exam:  General:  NAD, seated comfortably in chair  Respiratory:  Speaking in full sentences.   Neuro: strength 5/5 in lower extremities in both proximal and distal muscle groups.   MSK: He walks with a cane due to lateral right hip pain. Decreased light touch bilaterally LE in below the knees.    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 He also has a history of normal pressure hydrocephalus and peripheral neuropathy of unclear etiology,    Appointment with neurology in AUG to address progression in RIGHT LE neuropathic symptoms.    Lyrica  can be adjusted up to a maximum of 600 mg day.   In 3 to 7 days, increase the dose of pregabalin 75 mg to twice daily. Further dose adjustments can occur every 3-7 days as needed and tolerated.  At times we suggest a higher dose at nighttime to facilitate sleep (e.g. 300 mg at hs and 150 mg in the AM). If no benefit or dose limiting persistent side effects, taper and stop.   Consider potential role for tizanidine to address LE spasm and pain   Follow up with Dr. [name] 01/22/17 to discuss potential  role of injections to address LE symtom    Time statement  I spent a total time of over  25 minutes face-to-face with the patient, of which more than 50% was spent counseling as outlined in this note.

## 2017-02-15 NOTE — Progress Notes (Signed)
Does patient have 3 or more complaints:  NO  If yes, discuss with patient the need to schedule a follow up appointment due to limited appointment time.  Follow up appointment scheduled:  NO  Care Everywhere records available/ reconciled:  NO  PHQ2 complete:  NO  PHQ9 complete:  YES  Refills pended:  NO  Orders pended:  NO  Medications to discontinue:  NO    Health Maintenance updated:  NO    Health Maintenance   Topic Date Due   . Influenza Vaccine (1) 05/03/2017   . Depression Screening (PHQ-2)  10/20/2017   . Diabetes Screening  02/12/2020   . Colorectal Cancer Screening (Colonoscopy)  09/24/2020   . Lipid Disorders Screening  02/11/2022   . Tetanus Vaccine  11/29/2025   . Zoster Vaccine  Completed   . Hepatitis C Screening  Addressed   . HIV Screening  Addressed

## 2017-02-15 NOTE — Progress Notes (Signed)
[facility] Neurosurgery  Provider: Sherryl Manges LEWIS   Visit Date: 02/15/2017      HPI:  Jaquari Reckner is a 66 year old male with chronic low back pain. He was referred by Dr. Marisa Sprinkles.    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  He's been seen at the pain clinic for some years by Dr. Amada Jupiter.  Treatment has consisted of RFA, nerve blocks, TENS unit, physical therapy, and medical management.  Recently, Mr. Edenfield has developed right lower extremity pain.  This is several months old.  It was so bad that he was in the emergency room a couple weeks ago.  Mr. Ferger is currently taking Cymbalta and Lyrica.  In the past he has been managed with chronic narcotics.  This was stopped last year.    Recently, Mr., stability with sounds like right trochanteric bursitis.  There was swelling in his lateral hip and this preceded his thigh and leg pain.  His hip was injected and there was some improvement in the local discomfort but not in the radiating pain.    Mr. Arcia'  physical therapist told him he has a right L5 radiculopathy.    I reviewed an MRI from January of this year.  This demonstrates no obvious nerve root compression.  There is a broad-based disc protrusion at L4 5 however without nerve root compression or central canal stenosis.  Disc spaces are collapsed at L3 4, L4 5 and L5-S1.  There is not evidence of fusion.    REVIEW OF SYSTEMS:  Reviewed/verified scanned ROS form with patient. Pertinent positives/negatives are listed above in HPI.    PHYSICAL EXAM:  BP 126/85   Pulse 86   Ht 6\' 4"  (1.93 m)   Wt (!) 222 lb (100.7 kg)   BMI 27.02 kg/m      Gait appears normal.  He does have stocking distribution sensory deficit bilaterally.  Reflexes are absent in the lower extremity.  Motor examination is grossly intact.  Mr. Mcadams does have difficulty standing on his heels and toes, but I believe this is due to insensate feet.  Internal and sternal rotation of the hip does not reproduce  pain.  Straight leg raising test is negative.  Knee examination is unremarkable.    CURRENT MEDICATIONS:  Current Outpatient Prescriptions   Medication Sig Dispense Refill   . Acetaminophen 500 MG Oral Tab 2 tablets bid     . Albuterol Sulfate HFA (VENTOLIN HFA) 108 (90 Base) MCG/ACT Inhalation Aero Soln Inhale 2 puffs by mouth every 6 hours as needed for shortness of breath/wheezing. 1 Inhaler 0   . AmLODIPine Besylate 5 MG Oral Tab Take 1 tablet (5 mg) by mouth 2 times a day. 90 tablet 3   . Atorvastatin Calcium 10 MG Oral Tab Take 1 tablet (10 mg) by mouth daily. 90 tablet 1   . Budesonide 180 MCG/ACT Inhalation AEROSOL POWDER, BREATH ACTIVATED Inhale 1 puff by mouth every 12 hours. As needed (Patient not taking: Reported on 01/11/2017) 1 Inhaler 1   . Cholecalciferol (VITAMIN D3) 2000 units Oral Cap Take 1 capsule (2,000 Units) by mouth daily. For low level 1 capsule 1   . Clobetasol Propionate 0.05 % External Ointment Small amount to rash as needed bid for 1-2 weeks at a time 15 g 1   . Cyanocobalamin 1000 MCG Oral Tab one per day over-the-counter started 5/21 /2012 this level borderline low 1 Tab 1   . Diclofenac Sodium (VOLTAREN)  1 % Transdermal Gel Apply 2 g topically 4 times a day. Apply to neck, trapezius muscle, and low back. No more than 2g applied at a time. 1 Tube 5   . DULoxetine HCl 30 MG Oral CAPSULE ENTERIC COATED PARTICLES Take 1 capsule (30 mg) by mouth daily. 90 capsule 0   . DULoxetine HCl 60 MG Oral CAPSULE ENTERIC COATED PARTICLES Take 1 capsule (60 mg) by mouth daily. 90 capsule 0   . EPINEPHrine 0.3 MG/0.3ML Injection Solution Auto-injector Inject as instructed per patient package insert, 0.3 mg intramuscularly or subcutaneously into the thigh, if needed to treat anaphylaxis 3 each 1   . Lidocaine 5 % External Patch Apply 1 patch onto the skin daily. Apply to painful area for up to 12 hours in a 24 hour period. 30 patch 10   . Lisinopril 20 MG Oral Tab Take 1 tablet (20 mg) by mouth every 12  hours. 180 tablet 3   . Naproxen Sodium 220 MG Oral Cap Take 2 capsules (440 mg) by mouth every 12 hours as needed. 1 capsule 1   . Pregabalin 25 MG Oral Cap 4 po bid 02/15/2017 540 capsule 1   . Primidone 50 MG Oral Tab Take 1 tablet (50 mg) by mouth 2 times a day. 180 tablet 0   . THIAMINE HCL OR      . Triamcinolone Acetonide 0.1 % External Cream As needed to right ear bid x 1 week 1 Tube 0     No current facility-administered medications for this visit.        ALLERGIES:  Review of patient's allergies indicates:  Allergies   Allergen Reactions   . Bee Venom Hives, Itching and Swelling   . Gabapentin      Flu like symptoms, diarrhea, body ache upset stomach   . Lidocaine Rash     Lidocaine patch. PATIENT STATES HE IS ALLERGIC TO THE ADHESIVE NOT THE PATCH.       PAST SURGICAL HISTORY:  Past Surgical History:   Procedure Laterality Date   . ANES; COLONOSCOPY  2002    repeat in 3 years   . ANES; COLONOSCOPY & POLYPECTOMY  11/18/2007    repeat in 3 years   . UNLISTED PROCEDURE FEMUR/KNEE     . UNLISTED PROCEDURE HANDS/FINGERS     . UNLISTED PROCEDURE SPINE  2013    rfa to c3 to c 7        FAMILY HISTORY:  Family History     Problem (# of Occurrences) Relation (Name,Age of Onset)    Colon Cancer (1) Father    Heart (other) (1) Mother: MI    Other Family Hx (1) Other: myelodysplasia          SOCIAL HISTORY:  Social History     Social History   . Marital status: Married     Spouse name: N/A   . Number of children: N/A   . Years of education: N/A     Occupational History   . Not on file.     Social History Main Topics   . Smoking status: Never Smoker   . Smokeless tobacco: Former NeurosurgeonUser   . Alcohol use No   . Drug use: No   . Sexual activity: Not on file     Other Topics Concern   . Not on file     Social History Narrative    ** Merged History Encounter **  IMPRESSION / ASSESSMENT:    ICD-10-CM ICD-9-CM    1. Lumbar radiculopathy, chronic M54.16 724.4        I do not think Mr. Vest has a surgical option.   Because of chronicity it may be reasonable for him to see one of the orthopedic spine surgeons at [facility].  This may be fruitful.  I can say in my experience however surgery is not helpful.  I do not think flexion extension films would be helpful but because of chronicity are worse getting.    Prior EMR records reviewed as available and clinically relevant. All questions answered. Discharge and follow up instructions were discussed with the patient. The patient fully understands and is in agreement with the plan.    This was a 45 minute appointment with greater than 50% of the time spent in face-to-face counseling regarding the patient's care and treatment options.

## 2017-02-15 NOTE — Progress Notes (Signed)
Addendum   Vit d at 2147 , keep it up , .[name].[name].Issue stable no change needed \

## 2017-02-18 ENCOUNTER — Telehealth (HOSPITAL_BASED_OUTPATIENT_CLINIC_OR_DEPARTMENT_OTHER): Payer: Self-pay | Admitting: Neurology

## 2017-02-18 NOTE — Telephone Encounter (Signed)
(  TEXTING IS AN OPTION FOR [facility] CLINICS ONLY)  Is this a [facility] clinic? No      RETURN CALL: Detailed message on voicemail only      SUBJECT:  Appointment Request     REASON FOR REQUEST/SYMPTOMS: Follow up neuropsych testing results  REFERRING PROVIDER: n/a  REQUEST APPOINTMENT WITH: Tillman AbideWilliams, Michael Allan  REQUESTED DATE: Sept , TIME: n/a  UNABLE TO APPOINT BECAUSE: Call got dropped. Unable to appoint, no recall tab

## 2017-02-18 NOTE — Telephone Encounter (Signed)
Patient wife Bonita QuinLinda called back and asked that she be called on [phone] and a detailed message can be left on that line as her husbands cell is out of service currently. Thanks

## 2017-02-24 ENCOUNTER — Ambulatory Visit: Payer: PPO | Attending: Clinical Neuropsychologist

## 2017-02-24 ENCOUNTER — Ambulatory Visit (HOSPITAL_BASED_OUTPATIENT_CLINIC_OR_DEPARTMENT_OTHER): Payer: PPO | Admitting: Clinical Neuropsychologist

## 2017-02-24 DIAGNOSIS — S069X9S Unspecified intracranial injury with loss of consciousness of unspecified duration, sequela: Secondary | ICD-10-CM

## 2017-02-24 DIAGNOSIS — S069XAS Unspecified intracranial injury with loss of consciousness status unknown, sequela: Secondary | ICD-10-CM

## 2017-02-24 DIAGNOSIS — R4189 Other symptoms and signs involving cognitive functions and awareness: Secondary | ICD-10-CM | POA: Insufficient documentation

## 2017-02-24 DIAGNOSIS — F09 Unspecified mental disorder due to known physiological condition: Secondary | ICD-10-CM

## 2017-02-24 NOTE — Progress Notes (Signed)
Neuropsychology Testing Lab, Rehabilitation Medicine  Administered neuropsychological tests to patient under the direct supervision of Claretta FraiseNickolas Dasher, Ph.D.  The appointment started at  8:30 am and ended at  2:30 pm for a total of 4 hours of face to face time.    Tests scored and forwarded to Dr. Adolphus Birchwoodasher.

## 2017-02-24 NOTE — Progress Notes (Signed)
I completed an interview with the patient who was accompanied by his wife. Neuropsychological tests were chosen and are being administered by Jessica Pillerunder my direction.

## 2017-02-24 NOTE — Telephone Encounter (Signed)
Patient's wife Bonita QuinLinda called to check on status of request.

## 2017-03-01 ENCOUNTER — Ambulatory Visit (INDEPENDENT_AMBULATORY_CARE_PROVIDER_SITE_OTHER): Payer: PPO

## 2017-03-01 ENCOUNTER — Other Ambulatory Visit (INDEPENDENT_AMBULATORY_CARE_PROVIDER_SITE_OTHER): Payer: Self-pay | Admitting: Internal Medicine

## 2017-03-01 DIAGNOSIS — M1651 Unilateral post-traumatic osteoarthritis, right hip: Secondary | ICD-10-CM

## 2017-03-01 DIAGNOSIS — M25551 Pain in right hip: Secondary | ICD-10-CM

## 2017-03-01 NOTE — Progress Notes (Signed)
Addendum     There is noted severe osteoarthritis of the right hip and labrum tear on the MRI by report   will need to see a hip specialist  [facility] [phone]  Dr Madaline GuthrieFernando    I did refer you to him to see what you're options could  be

## 2017-03-08 ENCOUNTER — Ambulatory Visit (INDEPENDENT_AMBULATORY_CARE_PROVIDER_SITE_OTHER): Payer: PPO | Admitting: Internal Medicine

## 2017-03-08 VITALS — BP 142/86 | HR 87 | Ht 76.0 in | Wt 225.0 lb

## 2017-03-08 DIAGNOSIS — G894 Chronic pain syndrome: Secondary | ICD-10-CM

## 2017-03-08 DIAGNOSIS — M542 Cervicalgia: Secondary | ICD-10-CM

## 2017-03-08 DIAGNOSIS — L720 Epidermal cyst: Secondary | ICD-10-CM

## 2017-03-08 DIAGNOSIS — S069X9S Unspecified intracranial injury with loss of consciousness of unspecified duration, sequela: Secondary | ICD-10-CM

## 2017-03-08 DIAGNOSIS — Z6827 Body mass index (BMI) 27.0-27.9, adult: Secondary | ICD-10-CM

## 2017-03-08 DIAGNOSIS — G250 Essential tremor: Secondary | ICD-10-CM

## 2017-03-08 MED ORDER — DULOXETINE HCL 30 MG OR CPEP
30.0000 mg | DELAYED_RELEASE_CAPSULE | Freq: Every day | ORAL | 1 refills | Status: DC
Start: 2017-03-08 — End: 2017-05-21

## 2017-03-08 MED ORDER — DULOXETINE HCL 60 MG OR CPEP
60.0000 mg | DELAYED_RELEASE_CAPSULE | Freq: Every day | ORAL | 1 refills | Status: DC
Start: 2017-03-08 — End: 2017-05-21

## 2017-03-08 MED ORDER — PRIMIDONE 50 MG OR TABS
50.0000 mg | ORAL_TABLET | Freq: Two times a day (BID) | ORAL | 0 refills | Status: DC
Start: 2017-03-08 — End: 2017-06-28

## 2017-03-08 NOTE — Progress Notes (Signed)
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    Diagnoses and all orders for this visit:    Chronic pain syndrome  -     DULoxetine HCl 30 MG Oral CAPSULE ENTERIC COATED PARTICLES; Take 1 capsule (30 mg) by mouth daily.  -     DULoxetine HCl 60 MG Oral CAPSULE ENTERIC COATED PARTICLES; Take 1 capsule (60 mg) by mouth daily.    Cervicalgia  -     DULoxetine HCl 30 MG Oral CAPSULE ENTERIC COATED PARTICLES; Take 1 capsule (30 mg) by mouth daily.  -     DULoxetine HCl 60 MG Oral CAPSULE ENTERIC COATED PARTICLES; Take 1 capsule (60 mg) by mouth daily.    Essential tremor  -     Primidone 50 MG Oral Tab; Take 1 tablet (50 mg) by mouth 2 times a day.    Inclusion cyst  Comments:  to see Dr Wetzel BjornstadZook     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with loss of consciousness, sequela [facility](HCC)          He gets so easily frustrated  He reports having so much upset that he spanked the grand kids not ok ( he had this as child )  Grand kids not to be left alone w him   Maybe they should not visit while house under construction    Last brain injury was 11/2015 and no longer drinking  But am sure has left him short fused and poor temper   Will be seeing neurology    On back - Dr Graciela HusbandsKlein said not a good surgical candidate for hip  If the pain is down leg that bothers him the most , it will not be fixed by doing hip surgery  Pending Dr Madaline GuthrieFernando       Left back cyst - 1cm ,see derm     Refill this duloxetine , primadone ,and is on lyrica     See  Dr [name] and neurology and Dr Mayford KnifeWilliams and see me in follow up     I spent a total time of 25 minutes face-to-face with the patient, of which more than 50% was spent counseling and coordinating care as outlined in this note.     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    Patient      express understanding of care plan/medications    Risk of taking medication properly  and follow up discussed especially to call if problem  MEDICATION as of Discharge  Current Outpatient Prescriptions   Medication Sig Dispense Refill    Acetaminophen 500 MG Oral Tab 2 tablets bid      Albuterol Sulfate HFA (VENTOLIN HFA) 108 (90 Base) MCG/ACT Inhalation Aero Soln Inhale 2 puffs by mouth every 6 hours as needed for shortness of breath/wheezing. (Patient not taking: Reported on 03/08/2017) 1 Inhaler 0    AmLODIPine Besylate 5 MG Oral Tab Take 1 tablet (5 mg) by mouth 2 times a day. 90 tablet 3    Atorvastatin Calcium 10 MG Oral Tab Take 1 tablet (10 mg) by mouth daily. 90 tablet 1    Budesonide 180 MCG/ACT Inhalation AEROSOL POWDER, BREATH ACTIVATED Inhale 1 puff by mouth every 12 hours. As needed (Patient not taking: Reported on 01/11/2017) 1 Inhaler 1    Cholecalciferol (VITAMIN D3) 2000 units Oral Cap Take 1 capsule (2,000 Units) by mouth daily. For low level 1 capsule 1    Clobetasol Propionate 0.05 % External Ointment Small amount to rash as needed bid for 1-2 weeks at a time 15 g 1    Cyanocobalamin 1000 MCG Oral Tab one per day over-the-counter started 5/21 /2012 this level borderline low 1 Tab 1    Diclofenac Sodium (VOLTAREN) 1 % Transdermal Gel Apply 2 g topically 4 times a day. Apply to neck, trapezius muscle, and low back. No more than  2g applied at a time. 1 Tube 5    DULoxetine HCl 30 MG Oral CAPSULE ENTERIC COATED PARTICLES Take 1 capsule (30 mg) by mouth daily. 90 capsule 1    DULoxetine HCl 60 MG Oral CAPSULE ENTERIC COATED PARTICLES Take 1 capsule (60 mg) by mouth daily. 90 capsule 1    EPINEPHrine 0.3 MG/0.3ML Injection Solution Auto-injector Inject as instructed per patient package insert, 0.3 mg intramuscularly or subcutaneously into the thigh, if needed to treat anaphylaxis 3 each 1    Lidocaine 5 % External Patch Apply 1 patch onto the skin daily. Apply to painful area for up to 12 hours in a 24 hour period. 30 patch 10    Lisinopril 20 MG Oral Tab Take 1 tablet (20 mg) by mouth every 12 hours. 180 tablet 3    Naproxen Sodium 220 MG Oral Cap Take 2 capsules (440 mg) by mouth every 12 hours as needed. 1 capsule 1    Pregabalin 25 MG Oral Cap 4 po bid 02/15/2017 540 capsule 1    Primidone 50 MG Oral Tab Take 1 tablet (50 mg) by mouth 2 times a day. 180 tablet 0    THIAMINE HCL OR       Triamcinolone Acetonide 0.1 % External Cream As needed to right ear bid x 1 week 1 Tube 0     No current facility-administered medications for this visit.      ++++++++++++++++++++++++++++++++++++++++++++++++++++++++++++++  Start of visit   ++++++++++++++++++++++++++++++++++++++++++++++++++++++++++++++  Ronnie Mason is a 66 year old male                   BP 142/86    Pulse 87    Ht 6\' 4"  (1.93 m)    Wt (!) 225 lb (102.1 kg)    SpO2 97%    BMI 27.39 kg/m   Chief Complaint   Patient presents with    Medication Management    Imaging f/u     HPI  History of:    pcp [name],  MD reviewed last visit 02/15/2017 - mri w severe OA of right him and Labrium tear , radiology read 03/01/2017 referred to ortho- fernando   lyricia for neruopathic pain -   Chronic pain neuropathy/lyrica/pain clinic - chronic pain , not a good controlled substance candidate - sees also Dr Amada Jupiter    Past heavy  alcohol now not drinking    Normal pressure  hydrocephalus - now followed by Dr Chrissie Noa /Dr Dasher     was seen at sleep clinic    Also followed by Dr Aundria Rud    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 R parafalcine SAH likely related to trauma sustained on the day of presentation         Right leg numb - Dr Dr Tera Helper saw him 02/15/2017  -I do not think Mr. Stamour has a surgical option.  Because of chronicity it may be reasonable for him to see one of the orthopedic spine surgeons at [facility].  This may be fruitful.  I can say in my experience however surgery is not helpful.  I do not think flexion extension films would be helpful but because of chronicity are worse getting.    Seen today with  There are no preventive care reminders to display for this patient.        Today follow-up above issues:    His goal is to be able to walk normally  In pain   especially down right leg and right hip   Reports easily frustrated   No longer drinking   Wife reports short tempered when he drank  Now continue even after stop  And kids he reports make him cranky , he did spank them 2 mo ago   He know he is not supposed to do so - they are 2 and 4- but the trailer is small and another is on the way     [facility] [place] neurology pending   tramatic brain injury speech has told him about mindfulness, he is not doing it       Office Visit on 01/11/17   1. CBC, DIFF   Result Value Ref Range    WBC 4.47 4.3 - 10.0 10*3/uL    RBC 4.21 (L) 4.40 - 5.60 10*6/uL    Hemoglobin 14.3 13.0 - 18.0 g/dL    Hematocrit 43 38 - 50 %    MCV 103 (H) 81 - 98 fL    MCH 34.0 (H) 27.3 - 33.6 pg    MCHC 33.0 32.2 - 36.5 g/dL    Platelet Count 161 096 - 400 10*3/uL    RDW-CV 13.7 11.6 - 14.4 %    % Neutrophils 54 %    % Lymphocytes 28 %    % Monocytes 13 %    %  Eosinophils 4 %    % Basophils 1 %    % Immature Granulocytes 0 %    Neutrophils 2.40 1.80 - 7.00 10*3/uL    Absolute Lymphocyte Count 1.27 1.00 - 4.80 10*3/uL    Monocytes 0.56 0.00 - 0.80 10*3/uL    Absolute Eosinophil Count 0.18 0.00 - 0.50 10*3/uL    Basophils 0.05 0.00 - 0.20 10*3/uL    Immature Granulocytes 0.01 0.00 - 0.05 10*3/uL    Nucleated  RBC 0.00 0.00 10*3/uL    % Nucleated RBC 0 %   2. COMPREHENSIVE METABOLIC PANEL   Result Value Ref Range    Sodium 138 135 - 145 meq/L    Potassium 4.8 3.6 - 5.2 meq/L    Chloride 103 98 - 108 meq/L    Carbon Dioxide, Total 27 22 - 32 meq/L    Anion Gap 8 4 - 12    Glucose 107 62 - 125 mg/dL    Urea Nitrogen 26 (H) 8 - 21 mg/dL    Creatinine 1.61 (H) 0.51 - 1.18 mg/dL    Protein (Total) 6.9 6.0 - 8.2 g/dL    Albumin 4.3 3.5 - 5.2 g/dL    Bilirubin (Total) 0.5 0.2 - 1.3 mg/dL    Calcium 9.3 8.9 - 09.6 mg/dL    AST (GOT) 27 9 - 38 U/L    Alkaline Phosphatase (Total) 91 37 - 159 U/L    ALT (GPT) 26 10 - 48 U/L    GFR, Calc, European American 59 (L) >59 mL/min/[1.73_m2]    GFR, Calc, African American >60 >59 mL/min/[1.73_m2]    GFR, Information       Calculated GFR in mL/min/1.73 m2 by MDRD equation.  Inaccurate with changing renal function.  See http://depts.[email or web address].it.html   3. VITAMIN D (25 HYDROXY)   Result Value Ref Range    Vitamin D2 (25_Hydroxy) <1.0 ng/mL    Vitamin D3 (25_Hydroxy) 47.5 ng/mL    Vit D (25_Hydroxy) Total 47.5 20.1 - 50.0 ng/mL    Vit D (25_Hydroxy) Interp Normal:            20.1-50.0 ng/mL    4. LIPID PANEL   Result Value Ref Range    Cholesterol (Total) 202 (H) <200 mg/dL    Triglyceride 045 <409 mg/dL    Cholesterol (HDL) 811 >39 mg/dL    Cholesterol (LDL) 70 <130 mg/dL    Non-HDL Cholesterol 96 0 - 159 mg/dL    Cholesterol/HDL Ratio 1.9     Lipid Panel, Additional Info. (NOTE)    5. CREATINE KINASE TOTAL ACTIVITY   Result Value Ref Range    Creatine Kinase Total Activity 134 62 - 325 U/L       MEDICATION PRIOR TO  VISIT  Reports   Outpatient Medications Prior to Visit   Medication Sig Dispense Refill    Acetaminophen 500 MG Oral Tab 2 tablets bid      Albuterol Sulfate HFA (VENTOLIN HFA) 108 (90 Base) MCG/ACT Inhalation Aero Soln Inhale 2 puffs by mouth every 6 hours as needed for shortness of breath/wheezing. (Patient not taking: Reported on 03/08/2017) 1 Inhaler 0    AmLODIPine Besylate 5 MG Oral Tab Take 1 tablet (5 mg) by mouth 2 times a day. 90 tablet 3    Atorvastatin Calcium 10 MG Oral Tab Take 1 tablet (10 mg) by mouth daily. 90 tablet 1    Budesonide 180 MCG/ACT Inhalation AEROSOL POWDER, BREATH ACTIVATED Inhale 1 puff by mouth every 12 hours. As needed (Patient not taking: Reported on 01/11/2017) 1 Inhaler 1    Cholecalciferol (VITAMIN D3) 2000 units Oral Cap Take 1 capsule (2,000 Units) by mouth daily. For low level 1 capsule 1    Clobetasol Propionate 0.05 % External Ointment Small amount to rash as needed bid for 1-2 weeks at a time 15 g 1    Cyanocobalamin 1000 MCG Oral Tab one per day over-the-counter started 5/21 /2012 this level borderline low 1 Tab 1  Diclofenac Sodium (VOLTAREN) 1 % Transdermal Gel Apply 2 g topically 4 times a day. Apply to neck, trapezius muscle, and low back. No more than 2g applied at a time. 1 Tube 5    DULoxetine HCl 30 MG Oral CAPSULE ENTERIC COATED PARTICLES Take 1 capsule (30 mg) by mouth daily. 90 capsule 0    DULoxetine HCl 60 MG Oral CAPSULE ENTERIC COATED PARTICLES Take 1 capsule (60 mg) by mouth daily. 90 capsule 0    EPINEPHrine 0.3 MG/0.3ML Injection Solution Auto-injector Inject as instructed per patient package insert, 0.3 mg intramuscularly or subcutaneously into the thigh, if needed to treat anaphylaxis 3 each 1    Lidocaine 5 % External Patch Apply 1 patch onto the skin daily. Apply to painful area for up to 12 hours in a 24 hour period. 30 patch 10    Lisinopril 20 MG Oral Tab Take 1 tablet (20 mg) by mouth every 12 hours. 180 tablet 3    Naproxen Sodium  220 MG Oral Cap Take 2 capsules (440 mg) by mouth every 12 hours as needed. 1 capsule 1    Pregabalin 25 MG Oral Cap 4 po bid 02/15/2017 540 capsule 1    Primidone 50 MG Oral Tab Take 1 tablet (50 mg) by mouth 2 times a day. 180 tablet 0    THIAMINE HCL OR       Triamcinolone Acetonide 0.1 % External Cream As needed to right ear bid x 1 week 1 Tube 0     No facility-administered medications prior to visit.        REVIEW OF SYSTEMS - are otherwise negative unless as noted in HPI above or as additional issues below  Constitutional:     Right sciatica      PHYSICAL EXAM  BP 142/86    Pulse 87    Ht 6\' 4"  (1.93 m)    Wt (!) 225 lb (102.1 kg)    SpO2 97%    BMI 27.39 kg/m   Well Dressed     Constitution, In general looks:   well , good coloring  No diaphoresis  Patient is Alert  & Attentive  Patient is conversive    Psychiatric Mental Status / Neurologic Mental Status:  Speech is at baseline pressured and aggitated     Eyes: no discharge, no gross puffiness, non- icteric    Musculoskeletal:  No gross abnormal movement, twitch   No gross atrophy or deformity  Neurologic:   Grossly intact  Memory:   Attention       Thin legs   Tender on right hip on palp     Hematologic/Lymphatic /Immunologic:  1 cm inclusion cyst on back     ++++++++++++++++++++++++++++++++++++++++++++++++++  Allergy  Review of patient's allergies indicates:  Allergies   Allergen Reactions    Bee Venom Hives, Itching and Swelling    Gabapentin      Flu like symptoms, diarrhea, body ache upset stomach    Lidocaine Rash     Lidocaine patch. PATIENT STATES HE IS ALLERGIC TO THE ADHESIVE NOT THE PATCH.       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]     URI (upper respiratory infection) [J06.9]     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] 01/11/2017     Osteoarthritis of right hip x ray report -  moderate to advanced radiology read 01/11/2017      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[facility]) [S06.9X9A] 08/06/2016    Impaired mobility and ADLs [Z74.09] 04/08/2016    Difficulty in walking, not elsewhere classified [R26.2] 03/12/2016    Vitamin D deficiency [E55.9] 02/13/2016    Balance problem [R26.89] 01/23/2016    Chronic pain of both shoulders [M25.511, G89.29, M25.512] 01/23/2016     X ray arthritis  02/19/2016        Pain of right hip joint [M25.551] 01/23/2016    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) [G91.2] 01/15/2016    Intraparenchymal hemorrhage of brain (HCC) [I61.9] 01/06/2016    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, 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, 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] 01/06/2016    Essential hypertension [I10] 12/16/2015    Essential tremor [G25.0] 12/16/2015    Hx of spontan intraparenchymal intracran bleed assoc with hypertension [Z86.79] 12/16/2015     [place] - 4/29- 12/10/2015  Non-traumatic intraparenchymal hemorrhage, hypertensive of the left thalamus   Traumatic hemorrhage   -- falcine subdural hemorrhage, small cortical subarachnoid      Alcohol abuse [F10.10] 12/16/2015    Cerebral ventriculomegaly [G93.89] 10/09/2015    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    History of alcohol dependence (HCC) [F10.21] 06/26/2015    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) [G37.9] 05/07/2015    Idiopathic peripheral neuropathy [G60.9] 03/15/2015    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] 05/28/2014    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] 01/18/2014    Tremor [R25.1] 04/20/2013    Lumbosacral radiculopathy at S1 [M54.17] 03/22/2013     Left      Fall [W19.XXXA] 02/23/2013     BP < 100  Occasioanal tri[p and leg weakness      Back pain, lumbosacral [M54.5] 02/15/2013    Lumbar radiculopathy, chronic [M54.16] 02/15/2013    Neck pain [M54.2] 02/15/2013    Chronic pain [G89.29] 12/21/2012     Now at [facility], DO, Santiago Glad    Neck  pain -burn c3-7 on left side per patient  Some trigger injection    Lumbar pain-since 80's better with exercise      Bee sting allergy [Z91.038] 12/21/2012     hives      Numbness and tingling of leg [R20.0, R20.2] 12/21/2012     Left calf -left foot long term suspect from back-suspect L 45      Chronic back pain [M54.9, G89.29] 12/21/2012     Mri in mid scape 1/214  Multilevel degenerative disc disease of the lumbar spine. Circumferential disc bulge at all levels below and including L1-2. No significant central spinal canal stenosis or neuroforaminal narrowing.  2. Mild retrolisthesis of L5 on S1.            B12 deficiency [E53.8] 12/21/2012     Borderline low 5 /21/2014      Chronic renal insufficiency, stage III (moderate) [N18.3] 12/21/2012     5/ 2013      Hyperlipidemia [E78.5] 09/26/2012    Cervicalgia [M54.2] 03/11/2010     Radiofrequency ablation of c3 to c 7           Past Medical History:   Diagnosis Date    Carotid Sinus Hypersensitivity     HYPERTENSION      HYPOTENSION      Intraparenchymal hemorrhage of brain (HCC) 2017    Pain of right hip joint 01/23/2016    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 myelopathy or radiculopathy 05/28/2014    Syncope  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(HCC)     URI (upper respiratory infection)        Social History     Social History    Marital status: Married     Spouse name: N/A    Number of children: N/A    Years of education: N/A     Occupational History    Not on file.     Social History Main Topics    Smoking status: Never Smoker    Smokeless tobacco: Former Neurosurgeon    Alcohol use No    Drug use: No    Sexual activity: Not on file     Other Topics Concern    Not on file     Social History Narrative    ** Merged History Encounter **              Family History     Problem (# of Occurrences) Relation (Name,Age of Onset)    Colon Cancer (1) Father    Heart (other) (1) Mother: MI    Other Family Hx (1) Other: myelodysplasia          Social History      Social History    Marital status: Married     Spouse name: N/A    Number of children: N/A    Years of education: N/A     Social History Main Topics    Smoking status: Never Smoker    Smokeless tobacco: Former Neurosurgeon    Alcohol use No    Drug use: No    Sexual activity: Not on file     Other Topics Concern    Not on file     Social History Narrative    ** Merged History Encounter **            Health Maintenance   Topic Date Due    Influenza Vaccine (1) 05/03/2017    Depression Screening (PHQ-2)  02/15/2018    Diabetes Screening  02/12/2020    Colorectal Cancer Screening (Colonoscopy)  09/24/2020    Lipid Disorders Screening  02/11/2022    Tetanus Vaccine  11/29/2025    Zoster Vaccine  Completed    Hepatitis C Screening  Addressed    HIV Screening  Addressed

## 2017-03-08 NOTE — Progress Notes (Signed)
Does patient have 3 or more complaints:  NO  If yes, discuss with patient the need to schedule a follow up appointment due to limited appointment time.  Follow up appointment scheduled:  NO  Care Everywhere records available/ reconciled:  NO  PHQ2 complete:  NO  PHQ9 complete:  YES  Refills pended:  NO  Orders pended:  NO  Medications to discontinue:  NO    Health Maintenance updated:  NO    Health Maintenance   Topic Date Due    Influenza Vaccine (1) 05/03/2017    Depression Screening (PHQ-2)  02/15/2018    Diabetes Screening  02/12/2020    Colorectal Cancer Screening (Colonoscopy)  09/24/2020    Lipid Disorders Screening  02/11/2022    Tetanus Vaccine  11/29/2025    Zoster Vaccine  Completed    Hepatitis C Screening  Addressed    HIV Screening  Addressed

## 2017-03-25 NOTE — Telephone Encounter (Signed)
Patient is already scheduled on 04/14/17 @ 1pm with Dr. Mayford Knife at [facility].    Gretta Cool, [facility]

## 2017-03-29 ENCOUNTER — Encounter (HOSPITAL_BASED_OUTPATIENT_CLINIC_OR_DEPARTMENT_OTHER): Payer: Self-pay | Admitting: Neurology

## 2017-03-29 ENCOUNTER — Ambulatory Visit: Payer: PPO | Attending: Neurology | Admitting: Neurology

## 2017-03-29 VITALS — BP 126/85 | HR 87 | Ht 76.0 in | Wt 225.0 lb

## 2017-03-29 DIAGNOSIS — Z6827 Body mass index (BMI) 27.0-27.9, adult: Secondary | ICD-10-CM

## 2017-03-29 DIAGNOSIS — R269 Unspecified abnormalities of gait and mobility: Secondary | ICD-10-CM

## 2017-03-29 NOTE — Progress Notes (Signed)
ID/CC: Ronnie Mason is a 66 yo man presenting for evaluation of worsening gait in the setting of longstanding neuropathy, lumbar radiculopathy, and recent possible normal pressure hydrocephalus/ventriculomegaly found after a traumatic brain injury caused by a fall. The main question under consideration is whether the gait difficulty is coming more from peripheral neuropathy, radiculopathy, NPH, or other.      HPI:    #) Gait deterioration: Ronnie Mason has had a bad back for many years, due to an initial back injury in 1985. He is not sure what the nature of it was, but was told he needed shots. His gait had always been poor, with ups and down over time.    In November 2016 it was quite bad, as he had been diagnosed with , but he went to therapy, did lots of stretching, and was very careful with it. By April 2017, he was walking better than previously, was working out daily, and felt good. He has lost 50 lbs, half of which he has now regained. He was working full force on the the house project on [place]. However, his gait was not back to baseline and fell six times the year prior to his hemorrhagic stroke.    Unfortunately, he then had a significant fall in April 2017 with a  left thalamic hypertensive hemorrhage, which also led to a traumatic brain injury, and feels his gait has continued to deteriorate over the past four months, with a total of 9 months of acute-on-chronic walking issues. After the fall, he was incidentally found to have radiographic hydrocephalus and was evaluated by Dr. Mayford Knife & Dr. Marland Mcalpine. The gait and cognitive changes were not thought to be attributable to NPH at that time.    In November 2017 he was diagnosed with trochanteric bursitis and developed burning and radiating pain down the lateral leg for which he uses a TENS unit and ultrasound for pain relief especially before bed. This pain prevented him from walking or exercising, and he attributes the worsened gait in part to this.    The  most significant change is having to use a cane whenever he is outside the house. He apologizes that his memory is not what it used to be, and his wife has not been present for all of this. He is still independent with ADLs and most IADLs, but requires help from his wife to remember where things are, such as losing his checkbook.    He has not had incontinence of urine or stool except a brief time in the hospital.    #) Neuropathic-type pain and numbness:     Initially, the left side of his leg from the thigh down went numb (2001); he cannot recall if it was the whole leg or progressively ascending from the foot. It got number and number until he couldn't feel it at all. Currently, it's always numb or dull to sensation. The right leg was absolutely fine, but then the right side began to really hurt 1.5 years ago; the ball and outside of the foot both hurt. There are stinging and shooting/burning type pains down the outside of the right leg and the outside of the foot; the location of this pain does change and is not always the same. It is worst at night or on uneven ground. The only things that help are a combination of duloxetine and pregabalin (latter being uptitrated by Dr. Marisa Sprinkles). He has tried gabapentin with flulike symptoms, but Lyrica with more success. He has used  opiates chronically in the past, but not currently (stopped over a year ago).    In June 2015 after EMG showing an absent sural and superficial peroneal motor response on the left, he was diagnosed with a length-dependent polyneuropathy at Dr. Janan RidgeKirschner's office and had a repeat EMG in August 2016 (included below). Based on these findings, he was sent for LP in October 2016 to exclude inflammatory causes. This showed a protein of 60 but in the setting of 1000 RBCs. Left S1 nerve block did not help.    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 The ED gave him a muscle relaxant and  hydrocodone to get him through, but he ran out of the medication about halfway through the two week construction project. He now presents to better assess what is causing the gait difficulties.    #) Back pain/possible radiculopathy:     Low back pain and neck pain had been ongoing, he was seeing Dr. Miguel DibbleKirschner for this in October 2015. He had chronic back pain since the 1980's (see above), but L4-S1 radiofrequency ablations with Dr. Aubery LappingYoder had provided complete relief of back pain per Dr. [name] (his pain physician) on 06/24/2016. He endorses nerve root with injections for over a year (the right side), and then treated the left side for 1.5 years. Per our neurosurgery colleagues, he had in the past been treated with chronic opioids but then these were stopped in 2017. He currently has Cymbalta and Lyrica for medical management, and PT, TENS unit, RFA, nerve blocks for adjunctive therapy. Although there was no radiographic cord impingement or signal abnormality, there was concern for nerve root or radiculopathy due to NCS/EMG study (included below). It showed a demyelinating proximal or multifocal motor process, with subtle S1 axonopathy.      #) 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with radiographic ventriculomegaly:    In April 2017 he sustained a left thalamic IPH and concussion in the context of falling, with findings of ventriculomegaly incidentally noted. Dr. Mayford KnifeWilliams saw him 04/02/2016 and reviewed his exam and images. While he did agree that he has incidentally found hydrocephalus, he did not think that the current clinical presentation was secondary to this. The gait impairment was thought to be from neuropathy and chronic back pain, and the cognitive impairment from the small IPH and trauamtic brain injury.       PMH:     Past Medical History:   Diagnosis Date    Carotid Sinus Hypersensitivity     HYPERTENSION      HYPOTENSION      Intraparenchymal hemorrhage of brain (HCC) 2017    Pain of right hip joint 01/23/2016     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 myelopathy or radiculopathy 05/28/2014    Syncope     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(HCC)     URI (upper respiratory infection)       Essential tremor   Severe OA of right him and Labrium tear , radiology read 03/01/2017 referred to ortho- fernando   Lyrica for neruopathic pain -   Chronic pain neuropathy/lyrica/pain clinic - chronic pain , not a good controlled substance candidate - sees also Dr [name]    Past heavy alcohol now not drinking    Normal pressure hydrocephalus - now followed by Dr Chrissie NoaWilliam /Dr Dasher    Was seen at sleep clinic    Also followed by Dr Aundria RudWilkinson    H/o intraparenchymal brain hemorrhage -  11/2015 followinga fall and found to have a L thalamic IPH as well as right parafalcine SAH   Per Dr Blaine Hamper 07/2016 note 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 R parafalcine SAH likely related to trauma sustained on the day of presentation    Outpatient Medications Prior to Visit   Medication Sig Dispense Refill    Acetaminophen 500 MG Oral Tab 2 tablets bid      Albuterol Sulfate HFA (VENTOLIN HFA) 108 (90 Base) MCG/ACT Inhalation Aero Soln Inhale 2 puffs by mouth every 6 hours as needed for shortness of breath/wheezing. (Patient not taking: Reported on 03/08/2017) 1 Inhaler 0    AmLODIPine Besylate 5 MG Oral Tab Take 1 tablet (5 mg) by mouth 2 times a day. 90 tablet 3    Atorvastatin Calcium 10 MG Oral Tab Take 1 tablet (10 mg) by mouth daily. 90 tablet 1    Budesonide 180 MCG/ACT Inhalation AEROSOL POWDER, BREATH ACTIVATED Inhale 1 puff by mouth every 12 hours. As needed (Patient not taking: Reported on 01/11/2017) 1 Inhaler 1    Cholecalciferol (VITAMIN D3) 2000 units Oral Cap Take 1 capsule (2,000 Units) by mouth daily. For low level 1 capsule 1    Clobetasol Propionate 0.05 % External Ointment Small amount to rash as needed bid for 1-2 weeks at a time 15 g 1    Cyanocobalamin 1000  MCG Oral Tab one per day over-the-counter started 5/21 /2012 this level borderline low 1 Tab 1    Diclofenac Sodium (VOLTAREN) 1 % Transdermal Gel Apply 2 g topically 4 times a day. Apply to neck, trapezius muscle, and low back. No more than 2g applied at a time. 1 Tube 5    DULoxetine HCl 30 MG Oral CAPSULE ENTERIC COATED PARTICLES Take 1 capsule (30 mg) by mouth daily. 90 capsule 1    DULoxetine HCl 60 MG Oral CAPSULE ENTERIC COATED PARTICLES Take 1 capsule (60 mg) by mouth daily. 90 capsule 1    EPINEPHrine 0.3 MG/0.3ML Injection Solution Auto-injector Inject as instructed per patient package insert, 0.3 mg intramuscularly or subcutaneously into the thigh, if needed to treat anaphylaxis 3 each 1    Lidocaine 5 % External Patch Apply 1 patch onto the skin daily. Apply to painful area for up to 12 hours in a 24 hour period. 30 patch 10    Lisinopril 20 MG Oral Tab Take 1 tablet (20 mg) by mouth every 12 hours. 180 tablet 3    Naproxen Sodium 220 MG Oral Cap Take 2 capsules (440 mg) by mouth every 12 hours as needed. 1 capsule 1    Pregabalin 25 MG Oral Cap 4 po bid 02/15/2017 540 capsule 1    Primidone 50 MG Oral Tab Take 1 tablet (50 mg) by mouth 2 times a day. 180 tablet 0    THIAMINE HCL OR       Triamcinolone Acetonide 0.1 % External Cream As needed to right ear bid x 1 week 1 Tube 0     No facility-administered medications prior to visit.          Labs: SPEP, IF done by Dr. Miguel Dibble, IgG index normal, oligoclonal bands are negative. CSF: No nucleated cells. CSF RBCs 1000, protein 60.    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 He is currently building a house in the [place], but couldn't go due to foot pain. He has a history of heavy EtOH abuse.    FH: No FH neuropathy, both parents lived into their 34s. Father had colon cancer. Mother with heart disease.     ROS: Tends to run low for calcium,  magnesium. 14 pt ROS otherwise negative except for that above.    PE:  BP 126/85    Pulse 87    Ht 6\' 4"  (1.93 m)    Wt (!) 225 lb (102.1 kg)    BMI 27.39 kg/m     General: Right elbow with fluid-filled non-tender bulla over olecranon. New today after banging elbow, per pt.  MS: Fluent to 12 words, non-dysarthric, significantly blocking, tangential    Neuro Examination  General: Awake, alert man, looking irritable, cooperative but constantly interjection during interview, tangential though redirectable. Oriented to self and context as well as day, but not date nor recent dates/events. Wife is corroborating historian. Non dysarthric, fluent to 15 words, appropriate vocabulary.  Resp/CV: No incr WOB, no accessory mm use, no tachypnea; radial pulse regular, WWP, CR<2 seconds  MSK: Bilateral high arches.  Mental status:  Cranial nerves: PERRL, EOM notable for: cannot abduct the eyes to bury the sclerae bilaterally. Otherwise smooth pursuits. Upper and lower face symmetric. Tongue thrust midline, palate elevates midline. SCM/trapezius full. No diplopia. Did not tolerate funduscopy.  Motor: Full 5/5 strength at BL: deltoid, biceps, triceps, WE, WF, IO, APB, FE, FF; HF, KE, KF, DF, PF  Normal bulk and tone. No spasticity, fasciculations or atrophy. No pronator drift.  Sensation:  PP and temp, LT to high shin. Vibration: 5 seconds at ankle on left, 8 seconds at right great toe. Romberg with pseudoathetosis of toes with step-off at 3 seconds. Finger proprioception intact.  Reflexes (R/L): Patellar 2/2, Achilles 1/0. Plantar: down/mute  Coordination: Today, without resting, postural, or intention tremor except very slight at end arm-extension bilaterally (fine sinusoidal). No nystagmus, ataxia, titubation, scanning speech. No slowing of movements.  Gait and station: Tall man, able to stand upright without kyphosis or scoliosis. Can stand with feet together (eyes open), but feels unstable. Gait is slightly wide based with  foot eversion, but no significant steppage, ataxia, magnetic quality, mincing, or circumduction. No shuffling.       DATA/STUDIES:      Imaging (08/10/16 MRI L-spine):    FINDINGS:  ALIGNMENT: Normal  MARROW: There is a well circumscribed osseous lesion in L3 vertebral body,   demonstrating T1 and T2 hyperintensity, likely representing a benign osseous   hemangioma.  DISCS: Multilevel disc height loss and desiccation.   CORD: Conus ends normally at T12-L1. Visualized cord and cauda equina are   normal.  PARAVERTEBRAL SOFT TISSUES: Normal    AXIAL DISCS, DURAL COMPRESSION & FORAMINA:  L1-2: Circumferential disc bulge resulting in mild central canal stenosis. No   facet arthropathy or neural foraminal narrowing.  L2-3: Circumferential disc bulge resulting in mild central canal stenosis. No  neural foraminal narrowing.   L3-4: Circumferential disc bulge and facet arthropathy resulting in mild   central canal stenosis. There is mild left neural foraminal narrowing. Right   neural foramina is patent.   L4-5: Circumferential disc bulge with an annular fissure resulting in mild   central canal stenosis. Bilateral neural foramina are patent.   L5-S1: Circumferential disc bulge resulting in mild central canal stenosis. No  neural foraminal narrowing.      IMPRESSION:    Mild multilevel degenerative changes of the  lumbar spine, similar compared to   prior exam on 01/04/2014. There are circumferential disc bulges at multiple   levels with associated annular fissure at level L4-L5.    Note: The following findings are so common in people without low back pain   that while we report their presence, they must be interpreted with caution and  in the context of the clinical situation. (Reference --Jarvik et al, Spine   2001)    Findings (prevalence in patients without low back pain)  Disc degeneration (decreased T2 signal, height loss, bulge) (91%)  Disc T2 -- signal loss (83%)  Disc height loss (56%)  Disc bulge  (64%)  Disc protrusion (32%)  Annular tear (38%)         EMG RESULTS: 03/13/15 Dr. Miguel Dibble    Nerve Conduction Studies  Anti Sensory Summary Table   Site NR Onset (ms) Norm Onset (ms) P-T Amp (V) Norm P-T Amp Site1 Site2 Delta-0 (ms) Dist (cm) Vel (m/s) Norm Vel (m/s)   Left Sup Peron Anti Sensory (Ant Lat Mall)  29C   14 cm    4.0  2.3 >2 14 cm Ant Lat Mall 4.0 14.0 35 >32   Right Sup Peron Anti Sensory (Ant Lat Mall)  29C   14 cm    4.0  4.1 >2 14 cm Ant Lat Mall 4.0 14.0 35 >32   Left Sural Anti Sensory (Lat Mall)  29C    Averaged   Calf    4.6  4.1 >3.5 Calf Lat Mall 4.6 14.0 *30 >32   Right Sural Anti Sensory (Lat Mall)  29C   Calf    4.3  *3.4 >3.5 Calf Lat Mall 4.3 14.0 33 >32     Motor Summary Table   Site NR Onset (ms) Norm Onset (ms) O-P Amp (mV) Norm O-P Amp Site1 Site2 Delta-0 (ms) Dist (cm) Vel (m/s) Norm Vel (m/s)   Left Peroneal Motor (Ext Dig Brev)  29C   Ankle    5.8 <5.8 4.0 >1.5 B Fib Ankle 9.4 35.0 37 >34   B Fib    15.2  3.5  Poplt B Fib 2.3 10.0 43 >32   Poplt    17.5  3.4          Right Peroneal Motor (Ext Dig Brev)  29C   Ankle    5.5 <5.8 3.5 >1.5 B Fib Ankle 8.6 26.0 *30 >34   B Fib    14.1  2.7  Poplt B Fib 2.5 10.0 40 >32   Poplt    16.6  2.8          Left Tibial Motor (Abd Hall Brev)  29C   Ankle    4.5 <5.8 11.5 >2 Knee Ankle 13.8 47.0 34 >34   Knee    18.3  9.3          Right Tibial Motor (Abd Hall Brev)  29C   Ankle    5.2 <5.8 13.0 >2 Knee Ankle 12.8 48.0 38 >34   Knee    18.0  8.6            F Wave Studies   NR F-Lat (ms) Lat Norm (ms) L-R F-Lat (ms) L-R Lat Norm   Left Peroneal (Mrkrs) (EDB)  29C      *62.71 <57 2.99 <5.1   Right Peroneal (Mrkrs) (EDB)  29C      *59.72 <57 2.99 <5.1   Left Tibial (Mrkrs) (Abd Hallucis)  29C      *  69.35 <61.4 1.06 <5.7   Right Tibial (Mrkrs) (Abd Hallucis)  29C      *68.29 <61.4 1.06 <5.7     EMG   Side Muscle Nerve Root Ins Act Fibs Psw Amp Dur Poly Recrt Comment Fasciculations   Right  Iliacus Femoral L2-3 Nml Nml Nml Nml Nml 0 Nml  0   Right VastusMed Femoral L2-4 Nml Nml Nml Nml Nml 0 Nml  0   Right BicepsFemS Sciatic L5-S1 Nml Nml Nml Nml Nml 0 Nml  0   Right AntTibialis Dp Br Peron L4-5 Nml Nml Nml Nml Nml 0 Nml  0   Right Gastroc Tibial S1-2 Nml Nml Nml Nml Nml 0 Nml  0   Left Iliacus Femoral L2-3 Nml Nml Nml Nml Nml 0 Nml  0   Left VastusMed Femoral L2-4 Nml Nml Nml Nml Nml 0 Nml  0   Left BicepsFemS Sciatic L5-S1 *Incr Nml *1+ *Incr *Incr 0 Nml  0   Left AntTibialis Dp Br Peron L4-5 Nml Nml Nml Nml Nml 0 Nml  0   Left Gastroc Tibial S1-2 *Incr Nml *1+ Nml Nml 0 Nml  0   Left L3 Parasp Rami L3 Nml Nml Nml         Left L4 Parasp Rami L4 *Incr Nml Nml         Left L5 Parasp Rami L5 Nml Nml Nml         Left S1 Parasp Rami S1 *Incr Nml *2+         Right L3 Parasp Rami L3 Nml Nml Nml         Right L4 Parasp Rami L4 Nml Nml Nml         Right L5 Parasp Rami L5 Nml Nml Nml         Right S1 Parasp Rami S1 Nml Nml Nml           Nerve Conduction Studies  Motor Left/Right Comparison   Site L Lat (ms) R Lat (ms) L-R Lat (ms) L Amp (mV) R Amp (mV) L-R Amp (%) Site1 Site2 L Vel (m/s) R Vel (m/s) L-R Vel (m/s)   Peroneal Motor (Ext Dig Brev)  29C   Ankle 5.8 5.5 0.3 4.0 3.5 12.5 B Fib Ankle 37 *30 7   B Fib 15.2 14.1 1.1 3.5 2.7 22.9 Poplt B Fib 43 40 3   Poplt 17.5 16.6 0.9 3.4 2.8 17.6        Tibial Motor (Abd Hall Brev)  29C   Ankle 4.5 5.2 0.7 11.5 13.0 11.5 Knee Ankle 34 38 4   Knee 18.3 18.0 0.3 9.3 8.6 7.5          Anti Sensory Left/Right Comparison     Site L Lat (ms) R Lat (ms) L-R Lat (ms) L Amp (V) R Amp (V) L-R Amp (%) Site1 Site2 L Vel (m/s) R Vel (m/s) L-R Vel (m/s)   Sup Peron Anti Sensory (Ant Lat Mall)  29C   14 cm 4.0 4.0 0.0 2.3 4.1 43.9 14 cm Ant Lat Mall 35 35 0   Sural Anti Sensory (Lat Mall)  29C    Averaged   Calf 4.6 4.3 0.3 4.1 *3.4 17.1 Calf Lat Mall *30 33 3       NCV FINDINGS:   Evaluation of the  Right Peroneal Motor nerve demonstrated normal distal onset latency, normal amplitude, decreased conduction velocity (B Fib-Ankle), and normal conduction velocity (Poplt-B Fib).   The Left Sural  Anti Sensory nerve demonstrated normal amplitude and decreased conduction velocity (Calf-Lat [place]).   The Right Sural Anti Sensory nerve demonstrated reduced amplitude and normal conduction velocity (Calf-Lat [place]).   The Left Peroneal Motor nerve demonstrated normal distal onset latency, normal amplitude, normal conduction velocity (B Fib-Ankle), and normal conduction velocity (Poplt-B Fib).   The Left Tibial Motor and the Right Tibial Motor nerves demonstrated normal distal onset latency, normal amplitude, and normal conduction velocity (Knee-Ankle).   The Left Sup Peron Anti Sensory and the Right Sup Peron Anti Sensory nerves demonstrated normal amplitude and normal conduction velocity (14 cm-Ant Lat Mall).   F Wave studies indicate that the Left Peroneal F Wave has prolonged latency (62.71 ms).   The Right Peroneal F Wave has prolonged latency (59.72 ms).   The Left Tibial F Wave has prolonged latency (69.35 ms).   The Right Tibial F Wave has prolonged latency (68.29 ms).      EMG FINDINGS:   Monopolar needle EMG of the Left BicepsFemS muscle demonstrated increased insertional activity, slightly increased spontaneous activity, increased motor unit amplitude, and increased  motor unit duration.   The Left Gastrocnemius muscle demonstrated increased insertional activity and slightly increased spontaneous activity.   The Left L4 Parasp muscle demonstrated increased insertional activity.   The Left S1 Parasp muscle demonstrated increased insertional activity and moderately increased spontaneous activity.   All remaining muscles (as indicated in the preceding table) showed no evidence of abnormal spontaneous activity, motor unit remodeling or myopathic changes.    IMPRESSION:    This is an abnormal nerve  conduction study demonstrating peroneal motor nerve slowing in the segment below the fibular head to the ankle.  In addition there is significant prolongation of f-response latencies in all motor nerves tested with evidence of progression since the prior study of just over one year ago (12/27/13). SAP amplitudes and conduction velocities are normal.  EMG demonstrates abnormal spontaneous activity again in the S1 myotome on the left, consistent with findings reported in 2015.  Findings are consistent with a proximal/multifocal  motor nerve demyelinating disorder with evidence for subtle segmental motor axonopathy at S1 on the left only.  Clinical correlation is required.    Assessment/Recommendations: 66 yo man with chronic back pain and longstanding neuropathy that began asymmetrically, presenting for evaluation due to 9 months of acute on chronic gait deterioration.    We concluded that the gait changes are most likely due to an ongoing polyneuropathy given his clinical and EMG findings. On exam today, his gait is slightly wide based with foot eversion, but he has no magnetic or shuffling gait, nor steppage or circumduction. He does lack proprioception with a positive Romberg and likely both large and small fiber involvement. The gait changes are therefore unlikely due to hydrocephalus or his thalamic hemorrhage. With only mild foraminal narrowing at L3-4 and preserved strength without upper motor neuron signs or tone changes, structural root impingment is also less likely. Rather, the EMG suggests a multilevel proximal demyelinating process that bears further investigation.    Differential considerations include a mononeuritis multiplex, an inflammatory demyelinating progress, or a systemic illness. Lymphoma or neoplasm is less likely, though multiple levels were involved, given the long time frame. The increased spontaneous activity seen on the August 2016 EMG at L4, L5, and S1 levels suggested active denervation  in the month or so preceding the study.    1. Would recommend some additional labs, including ANA, hepatitis, cryoglobulin testing (causes of polyneuropathy)  2.  Would recommend a repeat NCS/EMG study as it has been 2 years since the last one, looking for active denervation and e/o demyelinating polyneuropathy  3. Agree with Dr. Joette Catching plan to increase the pregabalin dosage    RTC: 3 months or after the labs + EMG/NCS study are complete

## 2017-03-29 NOTE — Patient Instructions (Addendum)
1. Would recommend some additional labs, including ANA, hepatitis, cryoglobulin testing  2. Would recommend a repeat NCS/EMG study  3. Agree with Dr. Joette Catching plan to increase the pregabalin dosage    RTC: 3 months or after the labs + EMG/NCS study are complete

## 2017-03-30 ENCOUNTER — Ambulatory Visit: Payer: PPO | Attending: Neurology

## 2017-03-30 ENCOUNTER — Ambulatory Visit
Admit: 2017-03-30 | Discharge: 2017-03-30 | Disposition: A | Discharge status: Home / Self Care | Payer: PPO | Attending: Neurology | Admitting: Neurology

## 2017-03-30 DIAGNOSIS — R269 Unspecified abnormalities of gait and mobility: Secondary | ICD-10-CM | POA: Insufficient documentation

## 2017-03-30 LAB — HEPATITIS B BATTERY (HBSAG W/RFLX PCR, ANTI-HBS, ANIT-HBC)
Hepatitis B Core Ab: NONREACTIVE
Hepatitis B Surf Antibody Intl Units: <8 m[IU]/mL
Hepatitis B Surface Ab: NONREACTIVE
Hepatitis B Surface Antigen w/Reflex: NONREACTIVE

## 2017-03-30 LAB — HEPATITIS C AB WITH REFLEX PCR: Hepatitis C Antibody w/Rflx PCR: NONREACTIVE

## 2017-03-31 LAB — ANA REFLEX COMPREHENSIVE PANEL
Ana Interpretation Comment 1: NEGATIVE
Ana Screen By Multiplex: NEGATIVE
Antibodies to Nuclear Ags by IF (ANA): NEGATIVE

## 2017-03-31 NOTE — Progress Notes (Incomplete)
ID/CC: Ronnie Mason is a 66 yo man presenting for evaluation of worsening gait in the setting of longstanding neuropathy, lumbar radiculopathy, and recent possible normal pressure hydrocephalus/ventriculomegaly found after a traumatic brain injury caused by a fall. The main question under consideration is whether the gait difficulty is coming more from peripheral neuropathy, radiculopathy, NPH, or other.      HPI:    #) Gait deterioration: Ronnie Mason has had a bad back for many years, due to an initial back injury in 1985. He is not sure what the nature of it was, but was told he needed shots. His gait had always been poor, with ups and down over time.    In November 2016 it was quite bad, as he had been diagnosed with , but he went to therapy, did lots of stretching, and was very careful with it. By April 2017, he was walking better than previously, was working out daily, and felt good. He has lost 50 lbs, half of which he has now regained. He was working full force on the the house project on [place]. However, his gait was not back to baseline and fell six times the year prior to his hemorrhagic stroke.    Unfortunately, he then had a significant fall in April 2017 with a  left thalamic hypertensive hemorrhage, which also led to a traumatic brain injury, and feels his gait has continued to deteriorate over the past four months, with a total of 9 months of acute-on-chronic walking issues. After the fall, he was incidentally found to have radiographic hydrocephalus and was evaluated by Dr. Mayford Knife & Dr. Marland Mcalpine. The gait and cognitive changes were not thought to be attributable to NPH at that time.    In November 2017 he was diagnosed with trochanteric bursitis and developed burning and radiating pain down the lateral leg for which he uses a TENS unit and ultrasound for pain relief especially before bed. This pain prevented him from walking or exercising, and he attributes the worsened gait in part to this.    The  most significant change is having to use a cane whenever he is outside the house. He apologizes that his memory is not what it used to be, and his wife has not been present for all of this. He is still independent with ADLs and most IADLs, but requires help from his wife to remember where things are, such as losing his checkbook.    He has not had incontinence of urine or stool except a brief time in the hospital.    #) Neuropathic-type pain and numbness:     Initially, the left side of his leg from the thigh down went numb (2001); he cannot recall if it was the whole leg or progressively ascending from the foot. It got number and number until he couldn't feel it at all. Currently, it's always numb or dull to sensation. The right leg was absolutely fine, but then the right side began to really hurt 1.5 years ago; the ball and outside of the foot both hurt. There are stinging and shooting/burning type pains down the outside of the right leg and the outside of the foot; the location of this pain does change and is not always the same. It is worst at night or on uneven ground. The only things that help are a combination of duloxetine and pregabalin (latter being uptitrated by Dr. Marisa Sprinkles). He has tried gabapentin with flulike symptoms, but Lyrica with more success. He has used  opiates chronically in the past, but not currently (stopped over a year ago).    In June 2015 after EMG showing an absent sural and superficial peroneal motor response on the left, he was diagnosed with a length-dependent polyneuropathy at Dr. Janan RidgeKirschner's office and had a repeat EMG in August 2016 (included below). Based on these findings, he was sent for LP in October 2016 to exclude inflammatory causes. This showed a protein of 60 but in the setting of 1000 RBCs. Left S1 nerve block did not help.    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 The ED gave him a muscle relaxant and  hydrocodone to get him through, but he ran out of the medication about halfway through the two week construction project. He now presents to better assess what is causing the gait difficulties.    #) Back pain/possible radiculopathy:     Low back pain and neck pain had been ongoing, he was seeing Dr. Miguel DibbleKirschner for this in October 2015. He had chronic back pain since the 1980's (see above), but L4-S1 radiofrequency ablations with Dr. Aubery LappingYoder had provided complete relief of back pain per Dr. [name] (his pain physician) on 06/24/2016. He endorses nerve root with injections for over a year (the right side), and then treated the left side for 1.5 years. Per our neurosurgery colleagues, he had in the past been treated with chronic opioids but then these were stopped in 2017. He currently has Cymbalta and Lyrica for medical management, and PT, TENS unit, RFA, nerve blocks for adjunctive therapy. Although there was no radiographic cord impingement or signal abnormality, there was concern for nerve root or radiculopathy due to NCS/EMG study (included below). It showed a demyelinating proximal or multifocal motor process, with subtle S1 axonopathy.      #) 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with radiographic ventriculomegaly:    In April 2017 he sustained a left thalamic IPH and concussion in the context of falling, with findings of ventriculomegaly incidentally noted. Dr. Mayford KnifeWilliams saw him 04/02/2016 and reviewed his exam and images. While he did agree that he has incidentally found hydrocephalus, he did not think that the current clinical presentation was secondary to this. The gait impairment was thought to be from neuropathy and chronic back pain, and the cognitive impairment from the small IPH and trauamtic brain injury.       PMH:     Past Medical History:   Diagnosis Date    Carotid Sinus Hypersensitivity     HYPERTENSION      HYPOTENSION      Intraparenchymal hemorrhage of brain (HCC) 2017    Pain of right hip joint 01/23/2016     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 myelopathy or radiculopathy 05/28/2014    Syncope     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(HCC)     URI (upper respiratory infection)       Essential tremor   Severe OA of right him and Labrium tear , radiology read 03/01/2017 referred to ortho- fernando   Lyrica for neruopathic pain -   Chronic pain neuropathy/lyrica/pain clinic - chronic pain , not a good controlled substance candidate - sees also Dr [name]    Past heavy alcohol now not drinking    Normal pressure hydrocephalus - now followed by Dr Chrissie NoaWilliam /Dr Dasher    Was seen at sleep clinic    Also followed by Dr Aundria RudWilkinson    H/o intraparenchymal brain hemorrhage -  11/2015 followinga fall and found to have a L thalamic IPH as well as right parafalcine SAH   Per Dr Blaine Hamper 07/2016 note 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 R parafalcine SAH likely related to trauma sustained on the day of presentation    Outpatient Medications Prior to Visit   Medication Sig Dispense Refill    Acetaminophen 500 MG Oral Tab 2 tablets bid      Albuterol Sulfate HFA (VENTOLIN HFA) 108 (90 Base) MCG/ACT Inhalation Aero Soln Inhale 2 puffs by mouth every 6 hours as needed for shortness of breath/wheezing. (Patient not taking: Reported on 03/08/2017) 1 Inhaler 0    AmLODIPine Besylate 5 MG Oral Tab Take 1 tablet (5 mg) by mouth 2 times a day. 90 tablet 3    Atorvastatin Calcium 10 MG Oral Tab Take 1 tablet (10 mg) by mouth daily. 90 tablet 1    Budesonide 180 MCG/ACT Inhalation AEROSOL POWDER, BREATH ACTIVATED Inhale 1 puff by mouth every 12 hours. As needed (Patient not taking: Reported on 01/11/2017) 1 Inhaler 1    Cholecalciferol (VITAMIN D3) 2000 units Oral Cap Take 1 capsule (2,000 Units) by mouth daily. For low level 1 capsule 1    Clobetasol Propionate 0.05 % External Ointment Small amount to rash as needed bid for 1-2 weeks at a time 15 g 1    Cyanocobalamin 1000  MCG Oral Tab one per day over-the-counter started 5/21 /2012 this level borderline low 1 Tab 1    Diclofenac Sodium (VOLTAREN) 1 % Transdermal Gel Apply 2 g topically 4 times a day. Apply to neck, trapezius muscle, and low back. No more than 2g applied at a time. 1 Tube 5    DULoxetine HCl 30 MG Oral CAPSULE ENTERIC COATED PARTICLES Take 1 capsule (30 mg) by mouth daily. 90 capsule 1    DULoxetine HCl 60 MG Oral CAPSULE ENTERIC COATED PARTICLES Take 1 capsule (60 mg) by mouth daily. 90 capsule 1    EPINEPHrine 0.3 MG/0.3ML Injection Solution Auto-injector Inject as instructed per patient package insert, 0.3 mg intramuscularly or subcutaneously into the thigh, if needed to treat anaphylaxis 3 each 1    Lidocaine 5 % External Patch Apply 1 patch onto the skin daily. Apply to painful area for up to 12 hours in a 24 hour period. 30 patch 10    Lisinopril 20 MG Oral Tab Take 1 tablet (20 mg) by mouth every 12 hours. 180 tablet 3    Naproxen Sodium 220 MG Oral Cap Take 2 capsules (440 mg) by mouth every 12 hours as needed. 1 capsule 1    Pregabalin 25 MG Oral Cap 4 po bid 02/15/2017 540 capsule 1    Primidone 50 MG Oral Tab Take 1 tablet (50 mg) by mouth 2 times a day. 180 tablet 0    THIAMINE HCL OR       Triamcinolone Acetonide 0.1 % External Cream As needed to right ear bid x 1 week 1 Tube 0     No facility-administered medications prior to visit.        Labs: SPEP, IF done by Dr. Miguel Dibble, IgG index normal, oligoclonal bands are negative. CSF: No nucleated cells. CSF RBCs 1000, protein 60.    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 He is currently building a house in the [place],  but couldn't go due to foot pain. He has a history of heavy EtOH abuse.    FH: No FH neuropathy, both parents lived into their 18s. Father had colon cancer. Mother with heart disease.     ROS: Tends to run low for calcium,  magnesium. 14 pt ROS otherwise negative except for that above.    PE:  BP 126/85    Pulse 87    Ht 6\' 4"  (1.93 m)    Wt (!) 225 lb (102.1 kg)    BMI 27.39 kg/m     General: Right elbow with fluid-filled non-tender bulla over olecranon. New today after banging elbow, per pt.  MS: Fluent to 12 words, non-dysarthric, significantly blocking, tangential    Neuro Examination  General: Awake, alert man, looking irritable, cooperative but constantly interjection during interview, tangential though redirectable. Oriented to self and context as well as day, but not date nor recent dates/events. Wife is corroborating historian. Non dysarthric, fluent to 15 words, appropriate vocabulary.  Resp/CV: No incr WOB, no accessory mm use, no tachypnea; radial pulse regular, WWP, CR<2 seconds  MSK: Bilateral high arches.  Mental status:  Cranial nerves: PERRL, EOM notable for: cannot abduct the eyes to bury the sclerae bilaterally. Otherwise smooth pursuits. Upper and lower face symmetric. Tongue thrust midline, palate elevates midline. SCM/trapezius full. No diplopia. Did not tolerate funduscopy.  Motor: Full 5/5 strength at BL: deltoid, biceps, triceps, WE, WF, IO, APB, FE, FF; HF, KE, KF, DF, PF  Normal bulk and tone. No spasticity, fasciculations or atrophy. No pronator drift.  Sensation:  PP and temp, LT to high shin. Vibration: 5 seconds at ankle on left, 8 seconds at right great toe. Romberg with pseudoathetosis of toes with step-off at 3 seconds. Finger proprioception intact.  Reflexes (R/L): Patellar 2/2, Achilles 1/0. Plantar: down/mute  Coordination: Today, without resting, postural, or intention tremor except very slight at end arm-extension bilaterally (fine sinusoidal). No nystagmus, ataxia, titubation, scanning speech. No slowing of movements.  Gait and station: Tall man, able to stand upright without kyphosis or scoliosis. Can stand with feet together (eyes open), but feels unstable. Gait is slightly wide based with  foot eversion, but no significant steppage, ataxia, magnetic quality, mincing, or circumduction. No shuffling.     DATA/STUDIES:  Imaging (08/10/16 MRI L-spine):  FINDINGS:ALIGNMENT: Normal  MARROW: There is a well circumscribed osseous lesion in L3 vertebral body, demonstrating T1 and T2 hyperintensity, likely representing a benign osseous hemangioma.DISCS: Multilevel disc height loss and desiccation. CORD: Conus ends normally at T12-L1. Visualized cord and cauda equina are normal.PARAVERTEBRAL SOFT TISSUES: Normal  AXIAL DISCS, DURAL COMPRESSION & FORAMINA:  L1-2: Circumferential disc bulge resulting in mild central canal stenosis. No facet arthropathy or neural foraminal narrowing.  L2-3: Circumferential disc bulge resulting in mild central canal stenosis. Noneural foraminal narrowing.   L3-4: Circumferential disc bulge and facet arthropathy resulting in mild central canal stenosis. There is mild left neural foraminal narrowing. Right neural foramina is patent.   L4-5: Circumferential disc bulge with an annular fissure resulting in mild central canal stenosis. Bilateral neural foramina are patent.   L5-S1: Circumferential disc bulge resulting in mild central canal stenosis. Noneural foraminal narrowing.  IMPRESSION:  Mild multilevel degenerative changes of the lumbar spine, similar compared to prior exam on 01/04/2014. There are circumferential disc bulges at multiple levels with associated annular fissure at level L4-L5.    EMG RESULTS: 03/13/15 Dr. Miguel Dibble    Nerve Conduction Studies  Anti Sensory Summary Table  Site NR Onset (ms) Norm Onset (ms) P-T Amp (V) Norm P-T Amp Site1 Site2 Delta-0 (ms) Dist (cm) Vel (m/s) Norm Vel (m/s)   Left Sup Peron Anti Sensory (Ant Lat Mall)  29C   14 cm    4.0  2.3 >2 14 cm Ant Lat Mal 4.0 14.0 35 >32   Right Sup Peron Anti Sensory (Ant Lat Mall)  29C   14 cm    4.0  4.1 >2 14 cm Ant Lat Mall 4.0 14.0 35 >32   Left Sural Anti Sensory (Lat Mall)  29C    Averaged    Calf    4.6  4.1 >3.5 Calf Lat Mall 4.6 14.0 *30 >32   Right Sural Anti Sensory (Lat Mall)  29C   Calf    4.3  *3.4 >3.5 Calf Lat Mall 4.3 14.0 33 >32     Motor Summary Table   Site NR Onset (ms) Norm Onset (ms) O-P Amp (mV) Norm O-P Amp Site1 Site2 Delta-0 (ms) Dist (cm) Vel (m/s) Norm Vel (m/s)   Left Peroneal Motor (Ext Dig Brev)  29C   Ankle    5.8 <5.8 4.0 >1.5 B Fib Ankle 9.4 35.0 37 >34   B Fib    15.2  3.5  Poplt B Fib 2.3 10.0 43 >32   Poplt    17.5  3.4          Right Peroneal Motor (Ext Dig Brev)  29C   Ankle    5.5 <5.8 3.5 >1.5 B Fib Ankle 8.6 26.0 *30 >34   B Fib    14.1  2.7  Poplt B Fib 2.5 10.0 40 >32   Poplt    16.6  2.8          Left Tibial Motor (Abd Hall Brev)  29C   Ankle    4.5 <5.8 11.5 >2 Knee Ankle 13.8 47.0 34 >34   Knee    18.3  9.3          Right Tibial Motor (Abd Hall Brev)  29C   Ankle    5.2 <5.8 13.0 >2 Knee Ankle 12.8 48.0 38 >34   Knee    18.0  8.6            F Wave Studies   NR F-Lat (ms) Lat Norm (ms) L-R F-Lat (ms) L-R Lat Norm   Left Peroneal (Mrkrs) (EDB)  29C      *62.71 <57 2.99 <5.1   Right Peroneal (Mrkrs) (EDB)  29C      *59.72 <57 2.99 <5.1   Left Tibial (Mrkrs) (Abd Hallucis)  29C      *69.35 <61.4 1.06 <5.7   Right Tibial (Mrkrs) (Abd Hallucis)  29C      *68.29 <61.4 1.06 <5.7     EMG   Side Muscle Nerve Root Ins Act Fibs Psw Amp Dur Poly Recrt Comment Fasciculations   Right Iliacus Femoral L2-3 Nml Nml Nml Nml Nml 0 Nml  0   Right VastusMed Femoral L2-4 Nml Nml Nml Nml Nml 0 Nml  0   Right BicepsFemS Sciatic L5-S1 Nml Nml Nml Nml Nml 0 Nml  0   Right AntTibialis Dp Br Peron L4-5 Nml Nml Nml Nml Nml 0 Nml  0   Right Gastroc Tibial S1-2 Nml Nml Nml Nml Nml 0 Nml  0   Left Iliacus Femoral L2-3 Nml Nml Nml Nml Nml 0 Nml  0   Left VastusMed Femoral L2-4 Nml Nml Nml Nml  Nml 0 Nml  0   Left BicepsFemS Sciatic L5-S1 *Incr Nml *1+ *Incr *Incr 0 Nml  0   Left AntTibialis Dp Br Peron L4-5 Nml Nml Nml Nml Nml 0 Nml  0    Left Gastroc Tibial S1-2 *Incr Nml *1+ Nml Nml 0 Nml  0   Left L3 Parasp Rami L3 Nml Nml Nml         Left L4 Parasp Rami L4 *Incr Nml Nml         Left L5 Parasp Rami L5 Nml Nml Nml         Left S1 Parasp Rami S1 *Incr Nml *2+         Right L3 Parasp Rami L3 Nml Nml Nml         Right L4 Parasp Rami L4 Nml Nml Nml         Right L5 Parasp Rami L5 Nml Nml Nml         Right S1 Parasp Rami S1 Nml Nml Nml           NCV FINDINGS:   Evaluation of the Right Peroneal Motor nerve demonstrated normal distal onset latency, normal amplitude, decreased conduction velocity (B Fib-Ankle), and normal conduction velocity (Poplt-B Fib).   The Left Sural Anti Sensory nerve demonstrated normal amplitude and decreased conduction velocity (Calf-Lat Mall).   The Right Sural Anti Sensory nerve demonstrated reduced amplitude and normal conduction velocity (Calf-Lat [place]).   The Left Peroneal Motor nerve demonstrated normal distal onset latency, normal amplitude, normal conduction velocity (B Fib-Ankle), and normal conduction velocity (Poplt-B Fib).   The Left Tibial Motor and the Right Tibial Motor nerves demonstrated normal distal onset latency, normal amplitude, and normal conduction velocity (Knee-Ankle).   The Left Sup Peron Anti Sensory and the Right Sup Peron Anti Sensory nerves demonstrated normal amplitude and normal conduction velocity (14 cm-Ant Lat Mall).   F Wave studies indicate that the Left Peroneal F Wave has prolonged latency (62.71 ms).   The Right Peroneal F Wave has prolonged latency (59.72 ms).   The Left Tibial F Wave has prolonged latency (69.35 ms).   The Right Tibial F Wave has prolonged latency (68.29 ms).  EMG FINDINGS:   Monopolar needle EMG of the Left BicepsFemS muscle demonstrated increased insertional activity, slightly increased spontaneous activity, increased motor unit amplitude, and increased  motor unit duration.   The Left Gastrocnemius muscle  demonstrated increased insertional activity and slightly increased spontaneous activity.   The Left L4 Parasp muscle demonstrated increased insertional activity.   The Left S1 Parasp muscle demonstrated increased insertional activity and moderately increased spontaneous activity.   All remaining muscles (as indicated in the preceding table) showed no evidence of abnormal spontaneous activity, motor unit remodeling or myopathic changes.  IMPRESSION:  This is an abnormal nerve conduction study demonstrating peroneal motor nerve slowing in the segment below the fibular head to the ankle.  In addition there is significant prolongation of f-response latencies in all motor nerves tested with evidence of progression since the prior study of just over one year ago (12/27/13). SAP amplitudes and conduction velocities are normal.  EMG demonstrates abnormal spontaneous activity again in the S1 myotome on the left, consistent with findings reported in 2015.  Findings are consistent with a proximal/multifocal  motor nerve demyelinating disorder with evidence for subtle segmental motor axonopathy at S1 on the left only.  Clinical correlation is required.    Assessment/Recommendations: 66 yo man with chronic back pain and longstanding  neuropathy that began asymmetrically, presenting for evaluation due to 9 months of acute on chronic gait deterioration.  His 2016 NCS shows really rather well preserved studies - not much to support a neuropathy at that time and most remarkable for acute/subacute L S1 radic - the side today on which we see more abnormality.  He does not have a particularly wide based gait which we would expect w/ neuropathy being a main contributor to walking difficulty.  Updated NCS/ EMG will address this issue as to current level of neuropathy  We concluded that the gait changes are most likely due to an ongoing polyneuropathy given his clinical and EMG findings. On exam today, his gait is slightly wide based  with foot eversion, but he has no magnetic or shuffling gait, nor steppage or circumduction. He does lack proprioception with a positive Romberg and likely both large and small fiber involvement. The gait changes are therefore unlikely due to hydrocephalus or his thalamic hemorrhage. With only mild foraminal narrowing at L3-4 and preserved strength without upper motor neuron signs or tone changes, structural root impingment is also less likely. Rather, the EMG suggests a multilevel proximal demyelinating process that bears further investigation.    Differential considerations include a mononeuritis multiplex, an inflammatory demyelinating progress, or a systemic illness. Lymphoma or neoplasm is less likely, though multiple levels were involved, given the long time frame. The increased spontaneous activity seen on the August 2016 EMG at L4, L5, and S1 levels suggested active denervation    1. Would recommend some additional labs, including ANA, hepatitis, cryoglobulin testing (causes of polyneuropathy)  2. Would recommend a repeat NCS/EMG study as it has been 2 years since the last one, looking for active denervation and e/o demyelinating polyneuropathy  3. Agree with Dr. Joette Catching plan to increase the pregabalin dosage    RTC: 3 months or after the labs + EMG/NCS study are complete

## 2017-03-31 NOTE — Progress Notes (Signed)
I saw and evaluated the patient and agree with Dr.Schwarz's note.

## 2017-04-01 ENCOUNTER — Telehealth (HOSPITAL_BASED_OUTPATIENT_CLINIC_OR_DEPARTMENT_OTHER): Payer: Self-pay | Admitting: Clinical Neuropsychologist

## 2017-04-01 ENCOUNTER — Ambulatory Visit (INDEPENDENT_AMBULATORY_CARE_PROVIDER_SITE_OTHER): Payer: PPO | Admitting: Orthopaedic Surgery

## 2017-04-01 ENCOUNTER — Encounter (INDEPENDENT_AMBULATORY_CARE_PROVIDER_SITE_OTHER): Payer: Self-pay | Admitting: Orthopaedic Surgery

## 2017-04-01 VITALS — BP 130/90 | HR 93 | Ht 75.98 in | Wt 225.0 lb

## 2017-04-01 DIAGNOSIS — Z6827 Body mass index (BMI) 27.0-27.9, adult: Secondary | ICD-10-CM

## 2017-04-01 DIAGNOSIS — M25551 Pain in right hip: Secondary | ICD-10-CM

## 2017-04-01 NOTE — Progress Notes (Signed)
Primary Care Physician: [name], MD    Referring Physician: [name], MD      Dear Dr. Marisa Sprinkles, Scot Dock, MD    It was a pleasure to see your patient, [name], in clinic.  As you may recall, Ronnie Mason is a 66 year old year old male with a 4 month long history of right hip pain.    The pain began gradually; there was not obvious history of trauma or discrete injury.     The pain is located in the lateral thigh. He has previously been treated for trochanteric bursitis and reports significant relief from a bursal injection in April. He has been supplementing this with physical/massage therapy and home exercises. He states his pain today resembles the pain he was having with his bursitis. He does not have any groin pain. It is worse with walking and pressure to the area.    Ronnie Mason principal limitation is pain, and so he presents today for evaluation.    Functional history reveals that he can walk 2-3 blocks before needing to stop or rest.  Ronnie Mason uses the aid of a cane for ambulation.  Ronnie Mason does stairs needing a banister, one step at a time, and can sit in any chair, at least 1 hour.  Ronnie Mason arises from a chair normally, and can reach socks and shoes easily.    On a visual-analogue pain scale, he rates the pain as a 7 out of 10 in severity at rest, and as a 10 out of 10 in severity with weightbearing;he has tried activity modifications, NSAIDS, intra-articular corticosteroid injections and physical therapy to relieve the pain with moderate success.    Ronnie Mason has the following drug allergies: Review of patient's allergies indicates:  Allergies   Allergen Reactions    Bee Venom Hives, Itching and Swelling    Gabapentin      Flu like symptoms, diarrhea, body ache upset stomach    Lidocaine Rash     Lidocaine patch. PATIENT STATES HE IS ALLERGIC TO THE ADHESIVE NOT THE PATCH.       Ronnie Mason past medical history:     Patient Active Problem List   Diagnosis       Syncope    Hypotension    Carotid Sinus Hypersensitivity    URI (upper respiratory infection)    Cervicalgia    Hyperlipidemia    Chronic pain    Bee sting allergy    Numbness and tingling of leg    Chronic back pain    B12 deficiency    Chronic renal insufficiency, stage III (moderate)    Back pain, lumbosacral    Lumbar radiculopathy, chronic    Neck pain    Fall    Lumbosacral radiculopathy at S1    Tremor    Sensory neuropathy    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 myelopathy or radiculopathy    Idiopathic peripheral neuropathy    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 [facility])    History of alcohol dependence (HCC)    Cerebral ventriculomegaly    Cerebellar hypoplasia (HCC)    Essential hypertension    Essential tremor    Hx of spontan intraparenchymal intracran bleed assoc with hypertension    Alcohol abuse    Intraparenchymal hemorrhage of brain (HCC)    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       Ronnie Mason's family history:    Family History     Problem (# of Occurrences) Relation (Name,Age of Onset)    Colon Cancer (1) Father    Heart (other) (1) Mother: MI    Other Family Hx (1) Other: myelodysplasia            Ronnie Mason's past surgical history includes:    Past Surgical History:   Procedure Laterality Date    ANES; COLONOSCOPY  2002    repeat in 3 years    ANES; COLONOSCOPY & POLYPECTOMY  11/18/2007    repeat in  3 years    UNLISTED PROCEDURE FEMUR/KNEE      UNLISTED PROCEDURE HANDS/FINGERS      UNLISTED PROCEDURE SPINE  2013    rfa to c3 to c 7        Social history:     Social History     Social History    Marital status: Married     Spouse name: N/A    Number of children: N/A    Years of education: N/A     Occupational History    Not on file.     Social History Main Topics    Smoking status: Never Smoker    Smokeless tobacco: Former [employer]    Alcohol use No    Drug use: No    Sexual activity: Not on file     Other Topics Concern    Not on file     Social History Narrative    ** Merged History Encounter **            Comprehensive ROS is significant for muscle pain.  Ronnie Mason denies shortness of breath, chest pain or difficulty breathing.  The remaining systems out of a total of 14 (see standard form) are negative.    __________________________  Ht 6' 3.984" (1.93 m)  Wt 225 lb (102.059 kg)  Body mass index is 27.4 kg/m.  __________________________    On physical exam, Ronnie Mason looks his stated age, and he is oriented to time, place, and person.  Ronnie Mason demonstrates no excessive respiratory effort and his gait is normal. The overall limb alignment is neutral with weight bearing on the right, and neutral on the left.    Exam of the right hip:   No visible atrophy.  No pain with flexion or internal rotation of the hip. ROM is supple and pain free. Greater trochanter is tender to palpation.      Exam of the left hip: No visible atrophy.  No pain or crepitus with passive rotation. Greater trochanter is non-tender to palpation. Full, supple, pain-free passive ROM. Stinchfield test is negative.  SLR is negative.  Greater trochanter is non-tender to palpation.      Exam of the right knee:  Quads are well developed.  No effusion or atrophy.  Patella tracks centrally.  Passive full ROM, without pain.  No tenderness to palpation of  patellar facets or joint lines.  No pain with deep forced flexion, quad strength  5/5 against resistance.  Ligaments are stable in the coronal and sagittal planes, without pain.  Patellar grind test is negative.     Exam of the left knee:  Quads are well developed.  No effusion or atrophy.  Patella tracks centrally.  Passive full ROM, without pain.  No tenderness to palpation of patellar facets or joint lines.  No pain with deep forced flexion, quad strength 5/5 against resistance.  Ligaments are stable in the coronal and sagittal planes, without pain.  Patellar grind test is negative.     Leg length: equal      Distal neurocirculatory exam: Pulses intact (2+ PT and DP) bilaterally, sensation intact to light touch bilaterally, EHL motor strength 5/5 bilaterally.    Skin temperature and turgor are normal throughout the lower extremities, without cyanosis, swelling, varicosities, or lymphadenopathy.    __________________________      Independent visualization of the radiographic exam: Moderate osteoarthritis of the right hip. Kellgren-Lawrence Stage II        Review of the MRI shows advanced right hip arthritis.  __________________________    In summary, Ronnie Mason  is a 66 year old year old male with signs, symptoms, and radiological imaging consistent with right trochanteric bursitis.    Given the diagnosis, I feel this patient would continue to benefit from optimization of conservative management.  These strategies were described in detail with Ronnie Mason, including the use of NSAIDs, activity modification, physical therapy, and selective cortisone injections. Today, I recommended physical therapy to focus on abductor strengthening as well as another cortisone injection to be performed by Dr. Amada Jupiter.     Thank you again for allowing me to participate in the care of this very pleasant patient. No formal follow-up has been organized at this point, but we would be happy to see Ronnie Mason in clinic should his symptoms worsen or change.    Warmest  regards,      _______________________________________  Dr. Lance Bosch  Clinical Assistant Professor  Adult Joint Reconstruction  [place] [facility]  [place], [place]    Portions of this chart were written by Vivia Budge, Medical Scribe with oversight by Alphonse Guild. Madaline Guthrie, MD 04/01/2017 1:34 PM.

## 2017-04-01 NOTE — Telephone Encounter (Signed)
(  TEXTING IS AN OPTION FOR [facility] CLINICS ONLY)  Is this a [facility] clinic? No      RETURN CALL: Detailed message on voicemail only      SUBJECT:  General Message     REASON FOR REQUEST: Appointment    MESSAGE: Wife requesting to speak to Ronnie Mason in regards to the appointment scheduled on 09/12 with Dr. Adolphus Birchwoodasher and Dr. Mayford KnifeWilliams on the same day.  She states she was great with helping in scheduling,she is looking to reschedule

## 2017-04-01 NOTE — Progress Notes (Signed)
Wendall StadeI,Via Rosado, MD, interviewed and examined the patient while overseeing the documentation performed by my Medical Scribe, Vivia BudgeNoah Paisner. I have reviewed and revised as necessary the scribes note and agree with the documented findings and plan of care. Please refer to the scribe's note below for further detailed information regarding the patient encounter and exam.  04/01/2017. 6:48 PM

## 2017-04-07 NOTE — Telephone Encounter (Signed)
Appointment has been rescheduled.

## 2017-04-09 ENCOUNTER — Ambulatory Visit (HOSPITAL_BASED_OUTPATIENT_CLINIC_OR_DEPARTMENT_OTHER): Payer: PPO | Attending: Anesthesiology | Admitting: Anesthesiology

## 2017-04-09 DIAGNOSIS — M719 Bursopathy, unspecified: Secondary | ICD-10-CM | POA: Insufficient documentation

## 2017-04-12 LAB — CRYOGLOBULINS
Cryoglobulin, Serum At 10 Day: NEGATIVE %
Cryoglobulin, Serum At 3 Day: NEGATIVE %

## 2017-04-13 ENCOUNTER — Telehealth (INDEPENDENT_AMBULATORY_CARE_PROVIDER_SITE_OTHER): Payer: Self-pay | Admitting: Internal Medicine

## 2017-04-13 ENCOUNTER — Ambulatory Visit: Payer: PPO | Admitting: Clinical Neuropsychologist

## 2017-04-13 DIAGNOSIS — S069X9D Unspecified intracranial injury with loss of consciousness of unspecified duration, subsequent encounter: Secondary | ICD-10-CM

## 2017-04-13 DIAGNOSIS — G9389 Other specified disorders of brain: Secondary | ICD-10-CM

## 2017-04-13 DIAGNOSIS — F01A0 Vascular dementia, mild, without behavioral disturbance, psychotic disturbance, mood disturbance, and anxiety: Secondary | ICD-10-CM | POA: Insufficient documentation

## 2017-04-13 DIAGNOSIS — F0150 Vascular dementia without behavioral disturbance: Secondary | ICD-10-CM

## 2017-04-13 DIAGNOSIS — I619 Nontraumatic intracerebral hemorrhage, unspecified: Secondary | ICD-10-CM

## 2017-04-13 NOTE — Telephone Encounter (Signed)
Patient in lobby and is leaving town tomorrow AM. He came in he has golf size if not bigger abcess looking growth on elbow. He is not sure if it needs to be drained and or he needs referral. There is NO OPENINGS- and patient is wondering what to do. Please advise.

## 2017-04-13 NOTE — Telephone Encounter (Signed)
Spoke with the patient, he does not recall injuring his elbow and the abscess is not painful to the touch. The skin is not warm or red, no fever and range of motion is not limited.     Patient was advised that we do not have any openings available today, but [facility] to [facility] trip. Given a list of clinics in the area, he plans to head there now.

## 2017-04-13 NOTE — Progress Notes (Signed)
[place] [facility]  Department of Rehabilitation Medicine   Neuropsychological Evaluation Summary Report       Patient Ronnie, Mason Referred By Fransisca Connors, MD   Age 66 MRN [id number]   Date of Birth 10/13/1950 Handedness Right   Education 55 Sex Male     Dates of Evaluation:  02/24/2017 - Interview  02/24/2017 - Psychometric Testing (Billed separately under CPT Code 54098)  05/26/2017 - Scheduled Feedback    Time & Service:   6.0 Hours - Interview; Test Selection; Record Review; Results Integration; Summary Report Preparation    REASON FOR REFERRAL:  The patient is a 66 year old, right-handed, married male with 16 years of education. He was referred by Dr. 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 The information in this report was obtained from a clinical interview with the patient and his wife. His available medical records were also reviewed.    HISTORY OF PRESENTING CONDITION:  The patient was in his usual state of health until 11/30/2015, when he was found down outside of his house on his driveway. He was taken to [facility] ED an initially exhibited right-sided paralysis, but it resolved itself shortly after arrival. He underwent neuroimaging, which revealed enlarged ventricles, a left thalamic intraparenchymal hemorrhage, and a right parafalcine subdural hematoma, the latter of which was the result of his fall. 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 He was admitted to [facility]) for inpatient rehabilitation (IPR) and discharged home on 12/10/15. He then received intermittent supervision from his wife and started home PT, OT, and SLP. Has participated well with the therapies, and then transitioned to outpatient therapies at [facility], which were completed in September 2017.    Given  the incidental discovery of his enlarged ventricles, he was referred to Dr. Mayford Knife in the [facility], who saw him 02/21/16 and 04/02/16. Review of Dr. Mayford Knife most recent notes from that August indicated that the patient had no urinary symptoms, his only gait difficulty was due to low back problems, and that his memory difficulties following the stroke and the head injury were thought to be getting better. His neurological examination at that time was notable for an improvement in his MoCA score since July, from 20 to 26. Dr. 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 Given that that patient was essentially symptomatically hydrocephalus, but had risk factors for cognitive impairment (i.e., cerebral microvascular disease and hydrocephalus), he was referred for baseline neuropsychological testing.      Regarding the patients current cognitive complaints, he stated that his cognitive abilities have continued to improve since his stroke, though not yet to baseline. He stated that he continues to forget where he places items, has difficulty remembering names, and his wife stated that she still has to remind him more often than before. However, he denied any notable difficulties with attention, problem solving, visuospatial skills, and language. Functionally, he stated that he continues to experience pain in his legs and lower back, which he is currently pursuing treatment options with neurosurgery and orthopedic spine specialists, but he denied any problems with balance or recent falls. Both he and his wife also stated that he is capable of managing all of  his ADLs and IADLs without difficulty.    REMAINING MEDICAL HISTORY:  For more comprehensive information, please refer to his medical chart but to summarize, the  patients medical history also includes chronic lumbar radiculopathy, idiopathic peripheral neuropathy, hyperlipidemia, hypertension, and stage III chronic renal insufficiency. He reported that he went to the ED at [facility]) in mid-July due to difficulty walking from pain. He underwent an MRI of his hip, which found severe right hip osteoarthritis with extensive superior labral tear and degeneration. He was prescribed muscle relaxers, hydrocodone, and then discharged home. He otherwise denied any history of seizures, head injuries prior to his 2017 event, or known exposure to toxins. His family history is negative for neurodegenerative disease but includes myocardial infarction in his mother, a blood disorder in his father, and a completed suicide by his brother.     RECENT NEUROIMAGING (per chart):  1. Brain MRI (04/02/2016)  a. 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  b. 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 There is brisk flow within the cerebral aqueduct across this structure and enlargement of the fourth ventricle, however.  c. Moderate diffuse cerebral volume loss and evidence of chronic ischemic microvascular disease.    CURRENT MEDICATIONS (per chart):  1. Acetaminiphen 500mg   2. Albuterol Sulfate  3. Hydrocodone  4. Amlodipine 5mg   5. Budesonide Inhaler  6. Cyanocobalamin  7. Duloxetine HCl 60mg   8. Lisinopril 20mg   9. Naproxen 220mg   10. Pregabalin 25mg   11. Primidone 50mg     PSYCHIATRIC & SUBSTANCE USE HISTORY:  The patient described his recent mood as okay but that he can become easily irritable due to his degree of pain and how physically limited it can make him. He also alluded to some more long-standing emotional difficulties that he would not elaborate further on,  but acknowledged that they were being adequately attended to and managed. He denied any history of psychotherapy as well as any recent or remote thoughts of suicide or self-harm. He described his sleep as not too bad and getting on average 8-10 daily hours of sleep total (over the night and with naps), though he will experience muscle spasms that interfere with sleep maintenance. He stated that since his head injury, he has rarely drunk alcohol but beforehand, would drink approximately 1-2 bottle of hard alcohol per week. He did not elaborate more on the how long he had been drinking at that frequency. He otherwise denied any history of tobacco or illicit substance use.     PERTINANT PSYCHOSOCIAL & DEVELOPMENTAL HISTORY:  The patient is married and currently lives in [place], [place] with his wife. The patient was originally born and raised in [place], [place] along with his brother, who has since deceased. He denied any complications surrounding his birth and has far as he knows he reached all of his developmental milestones appropriately. The patients highest level of education is a bachelors degree of the [employer]. He described himself as a straight A student and he denied any notable history of learning difficulties or disability. He is employed as an Technical sales engineer and currently in the process of building a home on [place]. He stated that he spends most of his time working on the house, reading, and watching television.     THE FOLLOWING PROCEDURES WERE ADMINISTERED:    Advanced  Clinical Solutions (ACS) - Test of Premorbid Functioning, Brief Test of Attention (BTA), [place] Verbal Learning Test - 3rd Edition (CVLT-3), Controlled Oral Word Association Test (COWAT), Delis-Kaplan Executive Functioning System (D-KEFS) - Tower, Runner, broadcasting/film/video, Neuropsychological Assessment Battery (NAB) - Naming Test, Patient Health Questionnaire (PHQ-9), Ruff 2&7 Selective Attention Test Microsoft 2&7), Test of Memory  Malingering (TOMM), Trailmaking Test (Parts A & B), Wechsler Adult Intelligence Scale - 4th Edition (WAIS-IV), Wechsler Memory Scale - 4th Edition (WMS-IV) - Logical Memory, Visual Reproduction, & Symbol Span, [place] Card Sorting Test (WCST), Clinical Interview.    BEHAVIORAL OBSERVATIONS:  All observations during testing were seen and documented exclusively by the psychometrist. The patient arrived to his appointment on time and was accompanied by his wife. He was casually and appropriately dressed to climate. He wore corrective lenses for reading at times and stated that his vision and hearing were reportedly adequate for testing. He exhibited a very mild resting tremor in his right hand that became unnoticeable during writing tasks, but his gait, posture, and overall gross motor skills were unremarkable based on casual observation. His conversational speech was normal in volume, tone, prosody, and no word-finding difficulties or parphasias were observed. His affect appeared largely irritable and he expressed frustration about why he was referred for testing. Nonetheless, he was cooperative and understood task instructions without difficulty. He appeared to put forth adequate effort and passed all measures of dissimulation so the results of testing are considered an accurate reflection of his current cognitive abilities.    TEST RESULTS: (see attached appendix)  INTELLECTUAL ABILITY: The patients current intellectual abilities were estimated in the average range, which was concordant with his premorbid estimate as indicated by tests of word-reading and crystalized word-knowledge.     ATTENTION AND EXECUTIVE FUNCTIONING: The patients performance on a test of basic auditory attention was in the very superior range, while his auditory working memory and sustained attention performance fell in the low average to average range. On a test of mental arithmetic, he performed in the impaired-to-borderline range,  though his pattern of errors were notable for being slightly incorrect (i.e., often off by only 1 or 2 numbers). As for his visual attention, he performed in the average range in regard to scanning speed across different tasks, though made several inattentive errors when required to sustain his focus to details over time (performing in the borderline-to-low average range). He also performed in the low average range on a test of visual working memory.     In regard to his executive functioning, he also performed in the impaired range on a test of visuomotor set-shifting/flexibility, both in speed and accuracy. On tests of verbal reasoning and conceptual problem solving, he performed largely in the borderline range. On the former task, he appeared to provide fairly concrete responses suggestive of difficulty describing more abstract relationships. On the latter, his approach was also notable for predominant difficulty conceptualizing the underlying rule of the task while incorporating feedback. However, on a test of planning and sequencing, he performed in the average range and performed markedly well on a test of visual reasoning, in the high average range.     LEARNING AND MEMORY: On an unorganized, repeatedly presented word-list, he performed in the low average range in regard to learning efficiency and borderline range in with long delayed free recall, recalling 3 of 10 initially learned words. His pattern of recall was notable for a substantial number of intrusion errors (impaired range of performance) that were either similar  to target words or from incorrect lists. When provided with recognition cues, he performed in the borderline range but also made several false positives for semantically similar or distractor words on a separate list. On a second, organized and contextual story task, his learning efficiency and delayed recall performances were respectively in the average and impaired-to-borderline range. He  was able to freely recall 7 of 20 initially learned components of the stories but when provided recognition cues, he performed in the average range. This differential pattern between tasks suggests he is able to consolidate new verbal information but due to more predominant executive difficulties with source monitoring and mental organization, he has difficulty accurately accessing new information if it was presented in a less structured manner.    Lastly, on a test of visual memory with stimuli presented only once, his encoding and delayed free recall performances were respectively in the average and low average range. He was able to freely recall 14 of 31 initially learned visual details and when provided with recognition cues, he performed in the average range. With cues, he was able to accurately recognize the stimuli he initially learned but not any additional components.    LANGUAGE SKILLS: On a test of confrontation naming, he performed in the impaired-to-borderline range but was able to correctly name the items he missed with the aid of semantic and phonemic cues. His performance across tests of verbal fluency (semantic and letter) was respectively in the low average and average range. His performance across tests of general information and crystalized word-knowledge was in the low average and average range.    VISUAL SPATIAL SKILLS: The patients visuospatial and integrated visuoconstructional abilities were intact, in the average to high average range.    MOTOR FUNCTIONING: On a test of fine motor dexterity, the patient demonstrated a lateralized performance as he performed in the impaired-to-borderline range with both his dominant, right hand but the low average range for his left, non-dominant hand. There was no evidence of gross dyspraxia but he dropped the pegs twice when using his dominant hand.    EMOTIONAL FUNCTIONING: On a questionnaire of emotional distress, the patient denied experiencing any  recent depressive symptoms.     SUMMARY AND IMPRESSIONS:  Ronnie Mason is a 66 year old, right-handed, married male who was referred for a neuropsychological evaluation due to cognitive difficulties in the context of a history of complicated TBI, left thalamic stroke, and communicating hydrocephalus. Results of testing indicated his intellectual abilities were in the average range, concordant to his premorbid estimate, and across domains of basic attention, visuospatial ability, and visual memory, he performed to this expectation. However, he exhibited a predominantly lateralized pattern of deficits, with poorer performances across more language-oriented tasks. He showed consistent deficits across test of verbal learning and memory, confrontation naming/recall, and several tasks of executive functioning. Specifically, tests of cognitive flexibility, conceptual problem solving, and verbal reasoning, though for tasks of spatial sequencing and visual reasoning, he performed to expectation or better. He also exhibited weaknesses on tests of complex and sustained attention, but again, more predominantly when attending to auditory information versus visual stimuli. In regard to his reported memory difficulties, his results suggest that his ability to retain new information is largely intact, but how effectively he is able to encode and freely recall the information is dependent on the manner it is presented. For instance, he performed largely as expected with visual stimuli (though exhibited some weakness with free recall), but for loosely organized verbal information, he had considerable  difficulty encoding the information in an efficient manner. This discrepancy was somewhat mitigated if information was told to him in a more structured approach, but he still exhibited comparatively better learning and recall for visual stimuli.    Taken together, Mr. Thomass neuropsychological profile suggests that the primary etiology  of his cognitive difficulties is the result of his left thalamic stroke, as evidenced by his lateralized cognitive and motor abilities. However, although his subcortical pattern of executive deficits is also likely impacted by his stroke, the effects of hydrocephalus and cerebrovascular disease also cannot be ruled out as comorbid contributors. Fortunately, given the observed improvement on his MoCA scores between visits with Dr. Mayford Knife and that he was still able to largely retain structured and visual information the presence of a neurodegenerative process is unlikely. 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 I offer the following recommendations.     RECOMMENDATIONS:  1. Given Mr. Thomass neuropathic and chronic back pain, he may also benefit from engaging in psychotherapy with a provider whom has specialized training in cognitive-behavioral treatment for pain and those with chronic illness (i.e., a health psychologist). If interested, I would be happy to place this referral at [facility] or assist in an outside provider if preferred.     2. Mr. Oak may also benefit from some additional sessions of cognitive remediation, specifically aimed at helping him to develop more effective cognitive strategies to compensate for his observed verbal learning and executive deficits. Such services are also available here at [facility] so if interested, he is encouraged to discuss this with his primary care provider who would have to submit a referral.     3. Although Mr. Boudoin was hesitant to discuss his prior reasons for using alcohol to cope, he stated that he is attending to his reasons accordingly and is encouraged to remain abstinent from alcohol and  engaged in what he currently finds therapeutic. However, should he later desire a different course of treatment for any reason, such as psychotherapy, I would be happy to assist him with seeking other resources and establishing an appropriate provider.    4. Neuropsychological results indicated that his learning efficiency and retention is best when less demands are placed on him to organize new verbal information and visually. He benefited most when it was presented in a contextual, structured fashion and with visual aids. Therefore, whenever faced with new information, he is encouraged to take time to rehearse and associate it within a broader and meaningful context and if applicable, create visual cues (i.e., drawings; maps). If being told new information or instructions by another individual, he is encouraged to paraphrase the information back to ensure it is not only heard correctly, but to also enhance consolidation.     5. Mr. Puertas may also be interested in workbooks aimed at providing him with some additional strategies and tools to improve his cognitive abilities. Below are recommended texts on cognitive rehabilitation that he may find useful.  a. The Thinking Skills Workbook: A Cognitive Skills Remediation Manual for Adults  i. http://a.co/5NP9ruL  b. The Mild Traumatic Brain Injury Workbook  i. http://a.co/dLbMqgd  c. The Brain Injury Workbook: Exercises for Cognitive Rehabilitation  i. http://a.co/9WanVsi    6. Mr. Hessling is highly encouraged to remain physically active and exercise  daily in order to reduce his risk of any further neurovascular related causes of cognitive difficulties and improve his vascular health in order to reduce his risk of further stroke. Regular, moderate exercise has been shown to have a positive, long term impact on cognitive health and well as mood. It is also recommended that he continue to adhere to his current medication regimen and under the guidance of his physician, he  eat a balanced diet of lean protein, vegetables, fruits, and legumes, limiting his intake of processed foods, items high in added sugar, and sodium.    7. Although Mr. Thomass variable sleep issues appear to be due to pain, and consequently would improve concurrently with improved pain management, he may still benefit as well from is reviewing the non-pharmacological methods listed to enhance sleep hygiene.  a. Avoid drinking caffeinated beverages after 12:00PM.  b. Make the bedroom as dark as possible and avoid watching television, using computers, or smart phone/tablets approximately one hour before bed. Keeping computers, TVs, and work Proofreader out of the room will also strengthen the mental association between the bedroom and sleep.  c. Ease the transition from wake time to sleep time with a period of relaxing activities an hour or so before bed. Read a book or practice relaxation exercises. Avoid stressful, stimulating activities such as doing work or discussing emotional issues. Physically and psychologically stressful activities can cause the body to secrete stress hormones, which is associated with alertness.  d. Do not look at the clock when trying to fall asleep. Staring at a clock in your bedroom, either when you are trying to fall asleep or when you wake in the middle of the night, can actually increase stress, making it harder to fall asleep. Turn your clocks face away from you.   e. Keep your sleep schedule consistent. Going to bed and waking up at the same time each day sets the bodys "internal clock" to expect sleep at a certain time night after night. Try to stick as closely as possible to your routine on weekends to avoid a Monday morning sleep hangover. Waking up at the same time each day is the very best way to set your clock.    8. A neuropsychological re-evaluation is recommended after a period of 12-18 months in order to assess for cognitive changes with continued recovery from his stroke  and to update recommendations accordingly.      Thank you for this referral. Please call [phone] if you have specific questions or require further information.    Sincerely,    Brandy Zuba A. Wren Pryce, Ph.D.    Licensed Clinical Neuropsychologist [phone])  Assistant Professor  Department of Rehabilitation Medicine  [employer]    Appendix    PROCEDURES Score Norms   BASELINE INTELLECTUAL ABILITY       ACS - Test of Premorbid Functioning 108 (M=100, SD=15)        INTELLECTUAL ABILITY:       WAIS-IV         Index Scores           Full Scale IQ 92 (M=100, SD=15)         General Ability 97 (M=100, SD=15)       Verbal Comprehension Index 85 (M=100, SD=15)         Similarities 6 (M=10, SD=3)         Vocabulary 9 (M=10, SD=3)         Information 7 (M=10, SD=3)  Perceptual Reasoning Index 109 (M=100, SD=15)         Block Design 11 (M=10, SD=3)         Matrix Reasoning 14 (M=10, SD=3)         Visual Puzzles 10 (M=10, SD=3)       Working Memory Index 92 (M=100, SD=15)         Digit Span 13 (M=10, SD=3)         Arithmetic 4 (M=10, SD=3)       Processing Speed Index 86 (M=100, SD=15)         Coding 8 (M=10, SD=3)         Symbol Search 7 (M=10, SD=3)        ATTENTION AND EXECUTIVE FUNCTIONING      WMS-IV Symbol Span 16 Percentile     BTA 10-24 Percentile     Ruff 2 & 7         Auto Speed 21 Percentile       Auto Accuracy 19 Percentile       Control Speed 25 Percentile       Control Accuracy 7 Percentile       Total Speed 27 Percentile       Total Accuracy 12 Percentile     WCST-128         Total Categories (2) 6-10 Percentile       Trials to First Category (31) 2-5 Percentile       Perseverative Errors (29) 13 Percentile       Non-Perseverative Errors (26) 5 Percentile       Failure to Maintain Set (3) 11-16 Percentile     D-KEFS         Tower Test           Total Achievement 50 Percentile         Time Per Move 50 Percentile         Move Accuracy 37 Percentile     Trailmaking Test         Part A (36", 0  errors) 30 Percentile       Part B (297", 3 errors) <1 Percentile        LEARNING AND MEMORY       CVLT-3         Trials 1-5 (5,6,10,7,6) 19 Percentile       Trial B (0) 1 Percentile       Short Delay Free Recall (3) 5 Percentile       Long Delay Free Recall (3) 5 Percentile       Intrusions (19) 2 Percentile       Repetitions (3) 63 Percentile       Recognition Discriminability (14 Hits; 11 FP) 9 Percentile     WMS-IV         Logical Memory I (20) 25 Percentile       Logical Memory II (7) 2 Percentile       Logical Memory Recognition 26-50 Percentile       Visual Reproduction I (31) 37 Percentile       Visual Reproduction II (14) 16 Percentile       Visual Reproduction Recognition 26-50 Percentile        LANGUAGE       NAB Naming (28) 3 Percentile       Semantic Cues (1/3) - -       Phonemic Cues (2/2) - -     COWAT (FAS) 54 Percentile  Semantic Fluency - Animals 19 Percentile        VISUOSPATIAL SKILLS       Visual Reproduction Copy (43) >75 Percentile        MOTOR FUNCTIONING       Grooved Pegboard         Dominant (Right, 126, 2 Drops) 2 Percentile       Non-Dominant (89, 0 Drops) 21 Percentile        MOOD/PERSONALITY       PHQ-9 (0) None Descriptor   _____________________________________________________________________________________________  FP = False Positive  *Scores converted so higher value indicates better performance

## 2017-04-14 ENCOUNTER — Encounter (HOSPITAL_BASED_OUTPATIENT_CLINIC_OR_DEPARTMENT_OTHER): Payer: PPO | Admitting: Neurology

## 2017-04-14 ENCOUNTER — Encounter (HOSPITAL_BASED_OUTPATIENT_CLINIC_OR_DEPARTMENT_OTHER): Payer: PPO | Admitting: Clinical Neuropsychologist

## 2017-04-21 ENCOUNTER — Encounter (HOSPITAL_BASED_OUTPATIENT_CLINIC_OR_DEPARTMENT_OTHER): Payer: PPO | Admitting: Clinical Neuropsychologist

## 2017-04-21 ENCOUNTER — Encounter (HOSPITAL_BASED_OUTPATIENT_CLINIC_OR_DEPARTMENT_OTHER): Payer: PPO | Admitting: Neurology

## 2017-04-22 ENCOUNTER — Ambulatory Visit: Payer: PPO | Attending: Neurology | Admitting: Neurology

## 2017-04-22 ENCOUNTER — Other Ambulatory Visit (HOSPITAL_BASED_OUTPATIENT_CLINIC_OR_DEPARTMENT_OTHER): Payer: Self-pay | Admitting: Neurology

## 2017-04-22 ENCOUNTER — Telehealth (INDEPENDENT_AMBULATORY_CARE_PROVIDER_SITE_OTHER): Payer: Self-pay | Admitting: Internal Medicine

## 2017-04-22 DIAGNOSIS — M62838 Other muscle spasm: Secondary | ICD-10-CM

## 2017-04-22 DIAGNOSIS — G629 Polyneuropathy, unspecified: Secondary | ICD-10-CM | POA: Insufficient documentation

## 2017-04-22 DIAGNOSIS — M4300 Spondylolysis, site unspecified: Secondary | ICD-10-CM

## 2017-04-22 DIAGNOSIS — R200 Anesthesia of skin: Secondary | ICD-10-CM

## 2017-04-22 NOTE — Telephone Encounter (Signed)
Please paste  01/24/2017 er note to chart - sorain  And send back to me    I will forward to Dr Britta Mccreedy Distad   Looking for what meds were given to patient   Include all note and studies done please

## 2017-04-23 NOTE — Telephone Encounter (Signed)
[facility] ED 01/24/17:

## 2017-04-23 NOTE — Telephone Encounter (Signed)
Looks like he got narco and robaxnn   See note   Will send to     Dr Rosette Reveal

## 2017-04-29 ENCOUNTER — Encounter (HOSPITAL_BASED_OUTPATIENT_CLINIC_OR_DEPARTMENT_OTHER): Payer: Self-pay | Admitting: Neurology

## 2017-04-29 MED ORDER — METHOCARBAMOL 500 MG OR TABS
500.0000 mg | ORAL_TABLET | Freq: Four times a day (QID) | ORAL | 2 refills | Status: DC | PRN
Start: 2017-04-29 — End: 2017-05-21

## 2017-04-29 NOTE — Addendum Note (Signed)
Addended by: Tommi Rumps) on: 04/29/2017 09:12 AM     Modules accepted: Orders

## 2017-05-04 ENCOUNTER — Telehealth (HOSPITAL_BASED_OUTPATIENT_CLINIC_OR_DEPARTMENT_OTHER): Payer: Self-pay | Admitting: Sports Medicine

## 2017-05-04 NOTE — Telephone Encounter (Signed)
(  TEXTING IS AN OPTION FOR [facility] CLINICS ONLY)  Is this a [facility] clinic? 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 Patient mobile not working.   Patient needs to be rescheduled with Sports and Spine Doctor. cr "    Service Manual/PST does not list providers that do ESIs. Referral instructions advised ccr to schedule per PST diagnosis:Spinal Spondylolysis -Dr. Eliberto Ivory is one of the providers listed.    Please note which providers clinic would like patient to reschedule with in referral or as a part of this TE.        ------      Referral scheduling instructions:    PLEASE SCHEDULE THE FOLLOWING:    [facility]    CCR: Okay to schedule. Services are offered at multiple locations. Assist patient with scheduling at patient's location preference.     Note: when using the "Make Appt" button from the referral, you will need to accept the scheduling prompt and manually enter the department    Department:   Patient preferred location     Provider: Per Ortho PST       Visit Type:       Per Clinic SOP     Block Type:    Per Clinic SOP     Urgency: Routine    Consult Type: na    Additional Info: na    Please add the following to the appt note line: [facility]/Distad/Spondylolysis    PST symptom: Spinal Spondylolysis

## 2017-05-05 ENCOUNTER — Ambulatory Visit: Payer: PPO | Attending: Physical Medicine & Rehabilitation | Admitting: Physical Medicine & Rehabilitation

## 2017-05-05 ENCOUNTER — Encounter (HOSPITAL_BASED_OUTPATIENT_CLINIC_OR_DEPARTMENT_OTHER): Payer: Self-pay | Admitting: Physical Medicine & Rehabilitation

## 2017-05-05 ENCOUNTER — Encounter (HOSPITAL_BASED_OUTPATIENT_CLINIC_OR_DEPARTMENT_OTHER): Payer: PPO | Admitting: Sports Medicine

## 2017-05-05 VITALS — BP 141/97 | HR 98 | Temp 96.8°F | Resp 16 | Ht 76.0 in | Wt 225.0 lb

## 2017-05-05 DIAGNOSIS — M5416 Radiculopathy, lumbar region: Secondary | ICD-10-CM | POA: Insufficient documentation

## 2017-05-05 DIAGNOSIS — Z6827 Body mass index (BMI) 27.0-27.9, adult: Secondary | ICD-10-CM

## 2017-05-05 NOTE — Progress Notes (Signed)
Specializing in sports-related injuries and non-surgical care for conditions of the spine,  shoulder, elbow, wrist, hand, hip, knee, foot, and ankle.    DATE OF SERVICE: 05/05/2017    ID and CHIEF COMPLAINT   PATIENT:     Ronnie Mason     [id number]    PRIMARY CARE PROVIDER:  CONSULTING PROVIDER:    Langley Adie, MD  [address] [place] [address] [place]  [place] [id number]  [place] [address]     CHIEF COMPLAINT:  New Patient Consult and Back Pain      HISTORY OF PRESENT ILLNESS   Ronnie Mason is a 66 year old RHD male w/ PMH significant for chronic low back pain, bilateral leg  who is being seen at the request of Dr. Rudolpho Sevin for right sided worsening leg pain and consideration of epidural steroid injections. He reports that he has had right sided back and lower leg pain for 5+ years and this initially began at that time as atraumatic, insidious onset pain which has worsened over time. Initially the pain was intermittent in nature but is now constant. The pain is over the right lower back and radiates to the lateral right hip, lateral thigh, lateral-anterior knee, lateral calf, and into the lateral-plantar foot and is aching and pulsatile in nature. He has associated numbness and tingling in the right lateral foot. Associated also with pain-induced weakness, but not with frank weakness. His progression of symptoms has increased over the last six months.    He has a history of low back pain and reports that this is related to a trauma that he had in 1985. His symptoms have historically been mostly on the left side and he has tried multiple treatments for this pain including epidural steroid injections. He actually follows with Dr. Quita Skye at the [facility] pain clinic and she has performed multiple epidural steroid injections in the past, including on the right side in the upper lumbar region. Apparently Dr. Quita Skye is out of town currently thus when he met with Dr. Rudolpho Sevin  on 9/20 for an EMG test she elected to enter another referral for only the RLE symptoms which were in a radicular pattern for consideration of an TF ESI sooner rather than waiting for Dr. Quita Skye to return back in town.    Notably, he has seen a neurosurgeon at [facility] (Dr. Caryl Comes) who noted that he likely does not have a surgical option due to the chronicity of his pain, but noted that it would be reasonable to still see the spine surgeons at [facility], which he has not yet done. 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 Felicita Gage for possible surgical intervention who recommended ongoing conservative management.      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    Syncope     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URI (upper respiratory infection)      PAST SURGICAL HISTORY:   Past Surgical History:   Procedure Laterality  Date    ANES; COLONOSCOPY  2002    repeat in 3 years    ANES; COLONOSCOPY & POLYPECTOMY  11/18/2007    repeat in 3 years    UNLISTED PROCEDURE FEMUR/KNEE      UNLISTED PROCEDURE HANDS/FINGERS      UNLISTED PROCEDURE SPINE  2013    rfa to c3 to c 7      ALLERGIES:   Review of patient's allergies indicates:  Allergies   Allergen Reactions    Bee Venom Hives, Itching and Swelling    Gabapentin      Flu like symptoms, diarrhea, body ache upset stomach    Lidocaine Rash     Lidocaine patch. PATIENT STATES HE IS ALLERGIC TO THE ADHESIVE NOT THE PATCH.     MEDICATIONS:   Current Outpatient Prescriptions   Medication Sig Dispense Refill    Acetaminophen 500 MG Oral Tab 2 tablets bid      Albuterol Sulfate HFA (VENTOLIN HFA) 108 (90 Base) MCG/ACT  Inhalation Aero Soln Inhale 2 puffs by mouth every 6 hours as needed for shortness of breath/wheezing. (Patient not taking: Reported on 03/08/2017) 1 Inhaler 0    AmLODIPine Besylate 5 MG Oral Tab Take 1 tablet (5 mg) by mouth 2 times a day. 90 tablet 3    Atorvastatin Calcium 10 MG Oral Tab Take 1 tablet (10 mg) by mouth daily. 90 tablet 1    Budesonide 180 MCG/ACT Inhalation AEROSOL POWDER, BREATH ACTIVATED Inhale 1 puff by mouth every 12 hours. As needed (Patient not taking: Reported on 01/11/2017) 1 Inhaler 1    Cholecalciferol (VITAMIN D3) 2000 units Oral Cap Take 1 capsule (2,000 Units) by mouth daily. For low level 1 capsule 1    Clobetasol Propionate 0.05 % External Ointment Small amount to rash as needed bid for 1-2 weeks at a time 15 g 1    Cyanocobalamin 1000 MCG Oral Tab one per day over-the-counter started 5/21 /2012 this level borderline low 1 Tab 1    Diclofenac Sodium (VOLTAREN) 1 % Transdermal Gel Apply 2 g topically 4 times a day. Apply to neck, trapezius muscle, and low back. No more than 2g applied at a time. 1 Tube 5    DULoxetine HCl 30 MG Oral CAPSULE ENTERIC COATED PARTICLES Take 1 capsule (30 mg) by mouth daily. 90 capsule 1    DULoxetine HCl 60 MG Oral CAPSULE ENTERIC COATED PARTICLES Take 1 capsule (60 mg) by mouth daily. 90 capsule 1    EPINEPHrine 0.3 MG/0.3ML Injection Solution Auto-injector Inject as instructed per patient package insert, 0.3 mg intramuscularly or subcutaneously into the thigh, if needed to treat anaphylaxis 3 each 1    Lidocaine 5 % External Patch Apply 1 patch onto the skin daily. Apply to painful area for up to 12 hours in a 24 hour period. 30 patch 10    Lisinopril 20 MG Oral Tab Take 1 tablet (20 mg) by mouth every 12 hours. 180 tablet 3    Methocarbamol 500 MG Oral Tab Take 1 tablet (500 mg) by mouth every 6 hours as needed for muscle spasms. 30 tablet 2    Naproxen Sodium 220 MG Oral Cap Take 2 capsules (440 mg) by mouth every 12 hours as needed. 1  capsule 1    Pregabalin 25 MG Oral Cap 4 po bid 02/15/2017 540 capsule 1    Primidone 50 MG Oral Tab Take 1 tablet (50 mg) by mouth 2 times a day. 180 tablet 0  THIAMINE HCL OR       Triamcinolone Acetonide 0.1 % External Cream As needed to right ear bid x 1 week 1 Tube 0     No current facility-administered medications for this visit.      FAMILY HISTORY:   Family History     Problem (# of Occurrences) Relation (Name,Age of Onset)    Colon Cancer (1) Father    Heart (other) (1) Mother: MI    Other Family Hx (1) Other: myelodysplasia        SOCIAL HISTORY:  Social History     Social History    Marital status: Married     Spouse name: N/A    Number of children: N/A    Years of education: N/A     Occupational History    Not on file.     Social History Main Topics    Smoking status: Never Smoker    Smokeless tobacco: Former Systems developer    Alcohol use Yes    Drug use: No    Sexual activity: Not on file     Other Topics Concern    Not on file     Social History Narrative    ** Merged History Encounter **          REVIEW OF SYSTEMS:  Except for those mentioned in the HPI, complete review of systems is negative other than the following positive symptoms: Weight gain, right posterior elbow swelling which occurred after direct trauma to the area while working on his home on [place], he has previously lysed this with a push-pin and noted drainage of clear pale fluid.      PHYSICAL EXAMINATION   VITAL SIGNS:  BP (!) 141/97    Pulse 98    Temp 96.8 F (36 C) (Temporal)    Resp 16    Ht _0  (1.93 m)    Wt (!) 225 lb (102.1 kg)    SpO2 98%    BMI 27.39 kg/m .    Exercise Vitals: Total Minutes of Exercise per Week: (!) 0  GENERAL:  Well-Developed and Well Nourished.  PSYCHOLOGICAL:  No acute distress. Mood euthymic. Normal affect.  HEENT: Normal cephalic, atraumatic. Non-interic sclera. Moist mucous membranes    RESPIRATORY:  Non-labored breathing  CARDIOVASCULAR: No cyanosis, clubbing or edema.  SKIN: No  rashes or open wounds involving the areas examined.  NEUROLOGICAL:  CN: Grossly Intact  Sensation: Full to LT and PP in dermatomal and peripheral nerve distributions in BLE.  Reflexes: Reflexes are full in RLE (patella, medial hamstrings and achilles) and L achilles but diminished in L patella and medial hamstring. No clonus and negative Babinski bilaterally.  Gait/Station: Gait is antalgic. Slowed walking speed and decreased stride length bilaterally. Able to toe walk but not heel walk. Hip flexed with hyper lumbar lordotic posture.  Balance: Wide base of support with overall good stability.  MUSCULOSKELETAL:  Inspection: Lumbar spine reveals hyperlordosis, no evidence of lumbar shift or scoliosis. Pelvis is level and symmetric. Bilateral hips demonstrate normal alignment without obvious deformities. No erythema over the bilateral hips or lumbar spine. No atrophy. He also has swelling over the right posterior elbow with is consistent with a olecranon bursitis which is not erythematous or warm.  ROM: Lumbar flexion 65/90, extension 20/25 with pain in the bilateral lower back  Palpation: No increase in warmth over the lumbar spine or hips bilaterally. Tenderness to palpation over the lumbar paraspinals diffusely.  MMT: 5/5 strength in  bilateral HF, HE, KE, KF, Ankle DF, Ankle PF, Ankle Inversion/Eversion, EHL.    Special testing:   Right Left   Lumbar Facet Loading Pain in the lower lumbar spine diffusely Pain in the lower lumbar spine diffusely   SLR/ASLR Right lateral calf and foot pain in typical distribution Posterior calf pain on the left   Slump Right lateral calf and foot pain in typical distribution Lumbar pain   FABER Pain around hip and lumbar spine Negative   FADIR Pain around hip and lumbar spine Negative       DATA REVIEW   Imaging: The patient's imaging was independently reviewed by myself and Dr. Hinda Kehr, as well as with the patient.  XR L Spine, 03/01/2017: Severe multilevel degenerative disc and joint  disease with straightening of the lumbar spine and no instability on flex/ex views  MRI R Hip, 03/01/2017: Hip OA with severe chondromalacia without evidence of soft disease abnormalities  XR R Hip, 01/11/2017: Moderate, KL Grade 2 Hip OA  MRI L Spine, 08/10/2016: Mild multilevel degenerative changes of the lumbar spine, similar compared to prior exam on 01/04/2014. There are circumferential disc bulges at multiple levels with associated annular fissure at level L4-L5.    Records Reviewed:    04/01/2017: Progress Note by Dr. Jackey Loge (and by his medical scribe)  02/15/2017: Progress Note by Dr. Virl Axe  10/21/2016: Progress Note by Dr. Velta Addison    IMPRESSION   1. Chronic low back pain with bilateral sciatica  2. Worsening Right Lower extremity pain in an L5 radicular pattern  3. Right, non-septic, olecranon bursitis    Discussion:  Ronnie Mason has a very complicated history of low back pain bilaterally and is followed by pain medicine at [facility] with Dr. Quita Skye. He is being referred to [place] at this time for consideration of a one-time epidural steroid injection given his worsening right lower extremity symptoms. His subjective report of the symptoms are certainly in an L5 distribution, and possibly radicular in nature, however, there are no signs of radiculopathy on exam and his Lumbar MRI from 2018 demonstrates no definitive neural compression at the L4/5 level or at the L5/S1 neural foramen (though there is some disc encroachment of the lateral recess at L4/5). Despite this, he does have positive dural tension signs on physical examination which also localize to an L5 distribution in the RLE. We noted that we could perform a Right L5 TF ESI, but we would not be able to do so until 21/30, and he could certainly wait for Dr. Quita Skye to get back in town on 10/17. We also counseled him that our hope for this affecting his symptoms is limited as he has chronic symptoms and no definitive signs of radiculopathy on physical exam.  Despite this he still wanted to proceed with the right L5 TF ESI, hopefully next week.    Also, he has a right-sided olecranon bursitis based on history and physical examination. We have no concern for this being injected at this time, and despite his request to drain this bursa we refused given the high risk for infection in such cases. We also counseled him not to try to train it on his own. We educated him that in the setting of aseptic bursa the best approach to treatment is compression.    RECOMMENDATIONS   We reviewed records as noted above. Time was spent with the patient discussing the etiology, natural history, and treatment options for managing the patient's problems as noted above. Recommendations  are as follows:   IMAGING:  Images were reviewed as noted above.   OTHER DIAGNOSTICS: We will be trying to get a report from Dr. Rudolpho Sevin of his most recent EMG   BRACING/ORTHOTICS/DEVICES: We provided him with a compression sleeve to treat his right olecranon bursitis   ACTIVITY MODIFICATIONS: Patient was counseled to remain active and continue with activity despite low levels of pain. Should their pain significantly increase with activity then they should seek immediate reevaluation.   INJECTIONS: We placed an order for a right L5 TF ESI to be performed at the [facility], hopefully on 10/10. He realizes that this is a one-time procedure and will need to follow-up with Dr. Quita Skye when she returns back in town for further management    FOLLOW-UP: 1 week for right L5 TF ESI       If Not Better: Follow-up with Dr. Quita Skye    This patient was seen and examined with the attending physician, Dr. Hinda Kehr, who verified the salient portions of the history, physical examination, and management plan.    Beckie Busing, MD  Fellow, Sports Medicine  Department of Rehabilitation Medicine

## 2017-05-05 NOTE — Progress Notes (Signed)
ATTENDING ADDENDUM  I saw and examined the patient with  Tyson Babinski, Sports Medicine Fellow and agree with the above documented history, physical exam, assessment and plan with the following additions:    Jaquarius is a 66 year old man, referred for Dr. Leanne Chang for evaluation for possible right epidural steroid injection.  He has had radiating pain in his right leg for the past 2 years.  Workup has included an MRI lumbar spine and he also recently has seen a spine surgeon, Dr. Graciela Husbands at [facility] who did not feel that it was a surgical intervention.  He has also been seen by Dr. Amada Jupiter at [facility] pain clinic over the past few years and has had a number of spinal interventions including epidurals and radiofrequency ablations targeting the facet joints.  In talking with the patient, it sounds as though he was referred here for an injection because Dr. Amada Jupiter is not in the office and the patient had requested an injection as soon as possible.  He currently localizes the pain to the right buttock, radiating right posterior thigh, lateral leg and into the dorsum of the foot.    Imaging: I personally reviewed imaging with the patient  MRI lumbar spine 08-08-16  -Diffuse disc bulge at L4-L5 but no significant compression in the lateral recess  -At L5-S1 No significant foraminal narrowing bilaterally              X-ray right hip demonstrates hip joint osteoarthritis    Impression/Plan:  Bienvenido is a 66 year old man with right-sided low back, right buttock and radiating right leg pain, at this time possibly consistent with L5 radiculitis.    His symptoms do fit with an L5 radicular type pattern but we also some time reviewing his MRI lumbar spine from degenerative thousand 18 which were the most part does not show any significant compression of the L5 nerve root either in the lateral recess at L4 to 5 or the foramen at L5.  He has had these symptoms for at least 2 years and has also seen a spine surgeon at [facility] who did not  recommend surgical intervention.  We had a long discussion about the role of an epidural steroid injection to potentially help with pain but that we cannot make any promises that this would be of any benefit to his current symptoms.  We discussed the potential risk of the injection including but not limited to  infection, bleeding, nerve damage, spinal headache, spinal cord injury, worsening pain and steroid related side effects (immunosupression, headache, facial flushing, irritability/mood changes).     Ultimately we decided to move forward with a right L5 transforaminal epidural steroid injection.  I have placed the order in for this and we will get the insurance authorization process moving.  He notes that he has a appointment with Dr. Amada Jupiter on 05-19-17 and there is a chance that he may see her instead for this injection.    He also had another issue today of right posterior elbow swelling.  Clinically this is most consistent with an olecranon bursitis.  This was not hot nor erythematous and we discussed that at this time it does not appear infected.  We discussed the treatment typically is to compress the area and that the body will naturally reabsorb this.  Discussed indications for an aspiration include potential infection.  He indicated that several weeks ago he actually punctured this bursitis with a needle.  We counseled him against doing this in the future.  We provided  him with a compression sleeve for the elbow today.     Park Liter, MD  Attending Physician  [facility]  [facility] at [facility]

## 2017-05-05 NOTE — Patient Instructions (Signed)
Thank you for coming to the Sports Medicine Clinic today. We value the opportunity to work with you, your primary care provider, and the rest of your health care team to work to optimize your function and quality of life.    Our goal is to provide easy to understand instructions.    Your Diagnosis:  Right Lumbar Radicular pain in an L5 Pattern    Plan:  Schedule the injection into your back    Follow-Up: 1 week    When scheduling follow-up appointments or to reach a clinic nurse it is often easier to call the Sports Medicine Clinic back line directly rather than going through the central [facility] scheduling department. To access the back line dial [phone] and choose option 8.      You may receive a survey in the mail about your experience in our clinic today and about the care my team and I provided for you. Your feedback is very important to me. Please take a few moments to complete and return the survey

## 2017-05-06 ENCOUNTER — Encounter (HOSPITAL_BASED_OUTPATIENT_CLINIC_OR_DEPARTMENT_OTHER): Payer: Self-pay

## 2017-05-10 ENCOUNTER — Telehealth (HOSPITAL_BASED_OUTPATIENT_CLINIC_OR_DEPARTMENT_OTHER): Payer: Self-pay

## 2017-05-12 NOTE — Telephone Encounter (Signed)
Pt returned my call today.  Provided procedure scheduling and Instructions.

## 2017-05-18 NOTE — Progress Notes (Signed)
[facility]    Specializing in sports-related injuries and non-surgical care for conditions of the spine,  shoulder, elbow, wrist, hand, hip, knee, foot, and ankle.      05/19/2017    Ronnie Mason  [id number]      PROCEDURE:  Fluoroscopic guided right L5 transforaminal epidural steroid injection    DIAGNOSIS/INDICATION: right L5 radicular pain.  MRI from 08/10/2016 demonstrated Mild multilevel degenerative changes of the lumbar spine, similar compared to prior exam on 01/04/2014. There are circumferential disc bulges at multiple levels with associated annular fissure at level L4-L5.  Please see clinic notes for additional information (progress note from 05/05/2017).        Allergies: is allergic to bee venom; gabapentin; and lidocaine.  Allergies were reviewed with the patient. No contraindications were identified.      CONSENT:    Potential risks including pain, serious infection, paralysis, nerve injury, spinal headache, allergic reaction, and others were discussed with the patient. The patient was given and read a patient information sheet regarding injection procedures prior to todays injection. The patient understands the benefits, risks, and alternatives of the procedure and agrees to the procedure today.  Written informed consent was obtained.       PROCEDURE TECHNIQUE:    The patient was escorted to the procedure suite and positioned prone on the procedure table. A brief pause occurred prior to procedure in which final verification was performed. The skin overlying the area was prepped and draped in the usual sterile fashion using chlorhexidine.  Under fluoroscopic guidance the right L5 neuroforamen was located. Using a 25-gauge needle, the skin overlying the area was anesthetized with 1% lidocaine. Using intermittent fluoroscopic guidance, a 22 gauge 5 inch spinal needle was advanced to the region of the target nerve root.  A small amount of Omnipaque (iohexol) contrast was  instilled through extension tubing to identify the nerve root sleeve and assure no vascular tracking using live fluoroscopy. This was followed by the injection of 1.0 cc 1% lidocaine and 1.5 cc of dexamethasone /ml. The needle was then flushed with 1% lidocaine and removed, the skin was cleansed, and a bandage placed over the injection site.  The patient was assisted to the seated position and then escorted to the post procedure recovery area in good condition.  The patient tolerated the procedure well.  Vital signs remained stable both pre- and post- procedure.   Complications: none  Pre-injection VAS: 8/10  Post-injection VAS: 8/10    POST-INJECTION PLAN  1.  The patient was given instructions regarding the use of ice and appropriate activity level over the next few weeks.  Patient was instructed to call my office immediately if there are any questions or problems.    2.  The patient will follow up with Dr. Amada Jupiter in clinic in 2-3 weeks.     This procedure was performed under the supervision of the attending physician, Dr. Wynetta Fines, MD  Fellow, Sports Medicine  Department of Rehabilitation Medicine

## 2017-05-19 ENCOUNTER — Ambulatory Visit: Payer: PPO | Attending: Physical Medicine & Rehabilitation | Admitting: Physical Medicine & Rehabilitation

## 2017-05-19 ENCOUNTER — Encounter (HOSPITAL_BASED_OUTPATIENT_CLINIC_OR_DEPARTMENT_OTHER): Payer: Self-pay | Admitting: Physical Medicine & Rehabilitation

## 2017-05-19 ENCOUNTER — Ambulatory Visit (HOSPITAL_BASED_OUTPATIENT_CLINIC_OR_DEPARTMENT_OTHER): Payer: PPO | Admitting: Anesthesiology

## 2017-05-19 VITALS — BP 127/95 | HR 96 | Temp 97.7°F | Resp 14

## 2017-05-19 DIAGNOSIS — M5416 Radiculopathy, lumbar region: Secondary | ICD-10-CM | POA: Insufficient documentation

## 2017-05-19 MED ORDER — DEXAMETHASONE SODIUM PHOSPHATE 10 MG/ML IJ SOLN
15.0000 mg | Freq: Once | INTRAMUSCULAR | Status: AC
Start: 2017-05-19 — End: 2017-05-19
  Administered 2017-05-19: 15 mg via INTRALESIONAL

## 2017-05-19 NOTE — Progress Notes (Addendum)
[name] identified by name and DOB.   He ambulated to procedure area with steady gait.  He confirmed he is having a Right L5 TFESI  He denies taking any blood thinners.   He denies a history of fainting during medical procedures.   He has not fallen in the last 6 months.  He held his medications as directed on yellow form.  He has been NPO since 1100  He undressed self without assistance.   I verbally reviewed written discharge instructions and pain diary with him.   Confirmed ride home with his family Eduardo Osier, [phone]    Pain score prior to procedure:  8/10  Pain score after procedure:  8/10    Mr. Rumery met discharge criteria:  A/OX4/baseline, VSS/returned to baseline, ambulatory with steady gait. Site clean dry and intact.  Written and verbal discharge instructions, including emergency contact phone numbers, reviewed with Mr. Liller. Verbal understanding obtained from him.  Mr. Mustin dressed self without assistance. He was discharged in stable condition, ambulatory with steady gait to home with all belongings and with ride/responsible person.

## 2017-05-19 NOTE — Patient Instructions (Signed)
PATIENT EDUCATION for INJECTION PROCEDURES    Post-Procedure Discharge Instructions    After the procedure you might experience:  . Immediate and complete pain relief is rare; sometimes a mild increase in pain can occur for several days after the procedure  . Numbness and/or weakness in the area of your body supplied by the injected nerve; these symptoms should resolve but may last up to several hours  . Some soreness and bruising at the injection site(s)    Activities:  . If you have any weakness or numbness caused by the injection, DO NOT DRIVE or operate machinery and limit other activity until sensation returns to normal.  . Activity should be limited to basic levels (no exercise or other heavy activity) on the day of the procedure  . Physical therapy and higher level exercise should be delayed for three days following the procedure    Medications:   If you stopped taking any blood thinning medications such as Coumadin or Plavix, you may resume these tomorrow unless specified differently by the prescribing physician.    Site care:  . You may remove the band-aid after 6 hours.  . You may shower today. No swimming, tub baths or hot tubs for 24 hours following your procedure.  . For the first 48 hours, apply ice packs to the injection site for 15-20 minutes hourly as needed for comfort.  Wrap a light towel or cloth around ice packs and heating pad to protect the skin.  . After 48 hours, use a warm heating pad to the injection site for 15-20 minutes hourly as needed for comfort.    If you have diabetes and you received steroids today:   Check your blood sugar more frequently than usual as you may develop an increase in blood sugar for the next 10-14 days. Contact your diabetes physician if this occurs.    Call us if you develop any of the following symptoms in the next 7 days:  . Fever above 100 degrees F     . Any unusual increase in your level of pain  . Swelling, bleeding, redness, or increased tenderness at  the procedure or IV site  . Headache not relieved by Tylenol (if you had an epidural steroid injection)    Follow-up: As instructed.    Contact Us    [facility]:  [phone], option 8 to speak to the nurse    [facility]:  [phone], option 8    [facility]:   [phone]    Thank you for allowing us to participate in the management of your medical care.  Dr. Harrast, Dr. Krabak, Dr. Liem, Dr. Johnson and Dr. Sandhu                 POST-INJECTION EVALUATION    The following information needs to be completed and returned to our offices at your follow-up appointment.  You may be sore from the needles, so when rating your pain, please concentrate on your regular pain and not any soreness from the needle injection itself.          1-HOUR after the procedure, my pain level is (place an 'x' along the line below):                           No Pain                                                         Worst Pain Imaginable  0 1 2 3 4 5 6 7 8 9 10        2-HOURS after the procedure, my pain level is (place an 'x' along the line below):                 No Pain                                                                               Worst Pain Imaginable  0 1 2 3 4 5 6 7 8 9 10        3-HOURS after the procedure, my pain level is (place an 'x' along the line below):      No Pain                                                         Worst Pain Imaginable  0 1 2 3 4 5 6 7 8 9 10      4-HOURS after the procedure, my pain level is (place an 'x' along the line below):      No Pain              Worst Pain Imaginable  0 1 2 3 4 5 6 7 8 9 10      5-HOURS after the procedure, my pain level is (place an 'x' along the line below):       No Pain                                    Worst Pain Imaginable  0 1 2 3 4 5 6 7 8 9 10      24-HOURS after the procedure, my pain level is (place an 'x' along the line below):       No Pain               Worst Pain  Imaginable  0 1 2 3 4 5 6 7 8 9 10      2-DAYS after the procedure, my pain level is (place an 'x' along the line below):                            No Pain                                                                                                       Worst Pain Imaginable  0 1 2 3 4 5   6 7 8 9 10    3-DAYS after the procedure, my pain level is (place an 'x' along the line below):                No Pain                                                 Worst Pain Imaginable  0 1 2 3 4 5 6 7 8 9 10      1-WEEK after the procedure, my pain level is (place an 'x' along the line below):      No Pain                                                                        Worst Pain Imaginable  0 1 2 3 4 5 6 7 8 9 10      2-WEEKS after the procedure, my pain level is (place an 'x' along the line below):       No Pain                                               Worst Pain Imaginable  0 1 2 3 4 5 6 7 8 9 10            Thank you for your responses.    Please return this form to the Medical Assistant or your Physician.

## 2017-05-21 ENCOUNTER — Ambulatory Visit (INDEPENDENT_AMBULATORY_CARE_PROVIDER_SITE_OTHER): Payer: PPO | Admitting: Internal Medicine

## 2017-05-21 VITALS — BP 110/80 | HR 94 | Temp 98.1°F | Resp 18 | Ht 75.98 in | Wt 229.0 lb

## 2017-05-21 DIAGNOSIS — Z6827 Body mass index (BMI) 27.0-27.9, adult: Secondary | ICD-10-CM

## 2017-05-21 DIAGNOSIS — M542 Cervicalgia: Secondary | ICD-10-CM

## 2017-05-21 DIAGNOSIS — G609 Hereditary and idiopathic neuropathy, unspecified: Secondary | ICD-10-CM

## 2017-05-21 DIAGNOSIS — G894 Chronic pain syndrome: Secondary | ICD-10-CM

## 2017-05-21 DIAGNOSIS — Z23 Encounter for immunization: Secondary | ICD-10-CM

## 2017-05-21 DIAGNOSIS — M62838 Other muscle spasm: Secondary | ICD-10-CM

## 2017-05-21 MED ORDER — PREGABALIN 25 MG OR CAPS
ORAL_CAPSULE | ORAL | 1 refills | Status: DC
Start: 2017-05-21 — End: 2017-12-07

## 2017-05-21 MED ORDER — PREGABALIN 100 MG OR CAPS
100.0000 mg | ORAL_CAPSULE | Freq: Two times a day (BID) | ORAL | 1 refills | Status: DC
Start: 2017-05-21 — End: 2017-12-08

## 2017-05-21 MED ORDER — DULOXETINE HCL 60 MG OR CPEP
60.0000 mg | DELAYED_RELEASE_CAPSULE | Freq: Every day | ORAL | 1 refills | Status: DC
Start: 2017-05-21 — End: 2017-10-12

## 2017-05-21 MED ORDER — DULOXETINE HCL 30 MG OR CPEP
30.0000 mg | DELAYED_RELEASE_CAPSULE | Freq: Every day | ORAL | 1 refills | Status: DC
Start: 2017-05-21 — End: 2017-10-12

## 2017-05-21 MED ORDER — METHOCARBAMOL 500 MG OR TABS
ORAL_TABLET | ORAL | 2 refills | Status: DC
Start: 2017-05-21 — End: 2017-09-17

## 2017-05-21 NOTE — Progress Notes (Signed)
Ronnie Mason is a 66 year old male       BP 110/80    Pulse 94    Temp 98.1 F (36.7 C) (Oral)    Resp 18    Ht 6' 3.98" (1.93 m)    Wt (!) 229 lb (103.9 kg)    SpO2 96%    BMI 27.89 kg/m   Chief Complaint   Patient presents with    Medication Management     pt f/u Lyrica adjustment. Would like to dicuss new medication for muscle spasms.    Immunization or Injection     pt would like flu vaccine.     Patient seen with His wife    IMPRESSION / PLAN / DISCUSSION   (G89.4) Chronic pain syndrome  (primary encounter diagnosis)  (Z23) Needs flu shot  (Z61.096) Muscle spasm  (M54.2) Cervicalgia  (G60.9) Idiopathic peripheral neuropathy    Ronnie Mason was seen today for medication management and immunization or injection.    Diagnoses and all orders for this visit:    Chronic pain syndrome  -     DULoxetine HCl 60 MG Oral CAPSULE ENTERIC COATED PARTICLES; Take 1 capsule (60 mg) by mouth daily.  -     DULoxetine HCl 30 MG Oral CAPSULE ENTERIC COATED PARTICLES; Take 1 capsule (30 mg) by mouth daily.    Needs flu shot  -     influenza vaccine quadrivalent (adult) 0.5 mL IM - [id number]    Muscle spasm  -     Methocarbamol 500 MG Oral Tab; 1/2 to one at night for leg spasm    Cervicalgia  -     DULoxetine HCl 60 MG Oral CAPSULE ENTERIC COATED PARTICLES; Take 1 capsule (60 mg) by mouth daily.  -     DULoxetine HCl 30 MG Oral CAPSULE ENTERIC COATED PARTICLES; Take 1 capsule (30 mg) by mouth daily.    Idiopathic peripheral neuropathy  -     Pregabalin 25 MG Oral Cap; 1  po bid 05/21/2017  -     Pregabalin 100 MG Oral Cap; Take 1 capsule (100 mg) by mouth 2 times a day.     patient has no problems with the Lyrica at 100 mg twice a day he would like to try increasing the dosage and will go ahead and go to 125 mg twice a day  Side effect of medication discussed currently has none    Muscle spasm if improved with the muscle relaxant but he doesn't like to take more then 1 pill per day and even that one pill makes him feel somewhat  groggy he will cut that back to half a pill    We talked about the pain management clinic his very happy with injection in his back  He is thinking that and he will be talking with Dr. Amada Jupiter in the future he's not quite sure of the transition between [facility] and [facility]    We talked about how he can keep notes for himself and using the clues and assist  As recommended by Dr. Adolphus Birchwood so he doesn't get so irritated    Health Maintainace reviewed with patient today that are due  Health Maintenance   Topic Date Due    Zoster Vaccine (2 of 3) shingrix    Influenza Vaccine (1) Got today     Depression Screening (PHQ-2)  03/29/2018    Ok   PHQ2 Total Score: 0      Diabetes Screening  02/12/2020    Colorectal Cancer Screening (Colonoscopy)  09/24/2020    Lipid Disorders Screening     Results for orders placed or performed in visit on 01/11/17   LIPID PANEL   Result Value Ref Range    Cholesterol (Total) 202 (H) <200 mg/dL    Triglyceride 604131 <540<150 mg/dL    Cholesterol (HDL) 981106 >39 mg/dL    Cholesterol (LDL) 70 <130 mg/dL    Non-HDL Cholesterol 96 0 - 159 mg/dL    Cholesterol/HDL Ratio 1.9     Lipid Panel, Additional Info. (NOTE)          Tetanus Vaccine  11/29/2025    Hepatitis C Screening  Addressed    HIV Screening  Addressed       Return for wellness visit, next visit after this birthday.    Patient   express understanding of care plan/medications    Risk of taking medication properly and follow up discussed especially to call if problem  MEDICATION as of Discharge  Current Outpatient Prescriptions   Medication Sig Dispense Refill    Acetaminophen 500 MG Oral Tab 2 tablets bid      AmLODIPine Besylate 5 MG Oral Tab Take 1 tablet (5 mg) by mouth 2 times a day. 90 tablet 3    Atorvastatin Calcium 10 MG Oral Tab Take 1 tablet (10 mg) by mouth daily. 90 tablet 1    Cholecalciferol (VITAMIN D3) 2000 units Oral Cap Take 1 capsule (2,000 Units) by mouth daily. For low level 1 capsule 1    Clobetasol  Propionate 0.05 % External Ointment Small amount to rash as needed bid for 1-2 weeks at a time (Patient not taking: Reported on 05/21/2017) 15 g 1    Cyanocobalamin 1000 MCG Oral Tab one per day over-the-counter started 5/21 /2012 this level borderline low 1 Tab 1    Diclofenac Sodium (VOLTAREN) 1 % Transdermal Gel Apply 2 g topically 4 times a day. Apply to neck, trapezius muscle, and low back. No more than 2g applied at a time. 1 Tube 5    DULoxetine HCl 30 MG Oral CAPSULE ENTERIC COATED PARTICLES Take 1 capsule (30 mg) by mouth daily. 90 capsule 1    DULoxetine HCl 60 MG Oral CAPSULE ENTERIC COATED PARTICLES Take 1 capsule (60 mg) by mouth daily. 90 capsule 1    EPINEPHrine 0.3 MG/0.3ML Injection Solution Auto-injector Inject as instructed per patient package insert, 0.3 mg intramuscularly or subcutaneously into the thigh, if needed to treat anaphylaxis 3 each 1    Lidocaine 5 % External Patch Apply 1 patch onto the skin daily. Apply to painful area for up to 12 hours in a 24 hour period. 30 patch 10    Lisinopril 20 MG Oral Tab Take 1 tablet (20 mg) by mouth every 12 hours. 180 tablet 3    Methocarbamol 500 MG Oral Tab 1/2 to one at night for leg spasm 30 tablet 2    Naproxen Sodium 220 MG Oral Cap Take 2 capsules (440 mg) by mouth every 12 hours as needed. 1 capsule 1    Pregabalin 100 MG Oral Cap Take 1 capsule (100 mg) by mouth 2 times a day. 180 capsule 1    Pregabalin 25 MG Oral Cap 1  po bid 05/21/2017 180 capsule 1    Primidone 50 MG Oral Tab Take 1 tablet (50 mg) by mouth 2 times a day. 180 tablet 0    THIAMINE HCL OR  Triamcinolone Acetonide 0.1 % External Cream As needed to right ear bid x 1 week 1 Tube 0     No current facility-administered medications for this visit.         ++++++++++++++++++++++++++++++++++++++++++++++++++++++++++++++  Start of visit   Ronnie Mason is a 66 year old male                   BP 110/80    Pulse 94    Temp 98.1 F (36.7 C) (Oral)    Resp 18    Ht  6' 3.98" (1.93 m)    Wt (!) 229 lb (103.9 kg)    SpO2 96%    BMI 27.89 kg/m   Chief Complaint   Patient presents with    Medication Management     pt f/u Lyrica adjustment. Would like to dicuss new medication for muscle spasms.    Immunization or Injection     pt would like flu vaccine.     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   he tends to get short fused   Patient is followed by pain management last visit 04/13/2017- Dr Adolphus Birchwood -  referred for neuropsychological evaluation for cognitive difficulties-complicated TBI, left thalamic stroke, and communicating hydrocephalus.- See discussion recommendation recommendation for engaging in psychotherapy training and cognitive behavioral treatment for pain with cognitive illnesses and may benefit from additional session of cognitive remediation [place] speech-language therapy-see the recommendation on learning efficacy #4      best when less demands are placed on him to organize new verbal information and visually. He benefited most when it was presented in a contextual, structured fashion and with visual aids. Therefore, whenever faced with new information, he is encouraged to take time to rehearse and associate it within a broader and meaningful context and if applicable, create visual cues (i.e., drawings; maps). If being told new information or instructions by another individual, he is encouraged to paraphrase the information back to ensure it is not only heard correctly, but to also enhance consolidation.     Dr Margurite Auerbach - -Fluoroscopic guided right L5 transforaminal epidural steroid injection    Today follow-up above issues:     Wilford Grist is better   Pain gone and numbness not gone    Now he report need to  change to [place] Dr Lolita Lenz  / [facility] [name] [place] [facility]    Maybe going to [facility] for pain relief    Not sure who will be there?   Dr Gust Brooms    Not sure if will continue w Dr Amada Jupiter , will let me know     Need to see Doreene Nest - right ear and both need cleaning - establish patient   Methocarbamol works ok for leg spasm but it messes his head next day - helps at night , it wanders and spasms all over  Spasm not better post shot     Not taking every 6 hours     Want more lyrica now 100 bid  Want     Office Visit on 03/29/17   1. HEPATITIS B Battery (HBsAg w/rflx PCR, anti-HBs, anit-HBc)  Result Value Ref Range    Hepatitis B S Ag w/Rflx PCR Nonreactive NREAC    Hepatitis B Surface Ab Nonreactive     Hepatitis B Surf Antibody Intl Units <8.00 m[IU]/mL    Hepatitis B Core Ab Nonreactive NREAC    Hepatitis B Battery Interp       No evidence of past or present Hepatitis B infection.   2. Hepatitis C Ab with REFLEX PCR   Result Value Ref Range    Hepatitis C Antibody w/Rflx PCR Nonreactive NREAC    Hepatitis C Antibody Interpretation       No evidence of antibody to hepatitis C virus (HCV).  Anti-HCV may develop slowly (6 weeks - 6 months) over the course of hepatitis C virus infection.  Rarely it may be absent in cases of chronic   infection.  Anti-HCV may also disappear after recovery from acute, self-limited disease.     3. ANA REFLEX COMPREHENSIVE PANEL   Result Value Ref Range    Antibodies to Nuclear Ags by IF (ANA) Negative NRN    ANA Pattern by IF None     Ana Screen By Multiplex Negative NRN    Ana Interpretation Comment 1 Screen negative by IFA and multiplex.    4. CRYOGLOBULINS   Result Value Ref Range    Cryoglobulin, Serum At 3 Day Negative NRN %    Cryoglobulin, Serum At 10 Day Negative NRN %    Cryoglobulins Method       This test was developed and its performance characteristics determined by the [facility] Department of Laboratory Medicine in a manner consistent with CLIA  requirements. This test has   not been cleared or approved by the U.S. FDA.         MEDICATION PRIOR TO VISIT  Reports   Outpatient Medications Prior to Visit   Medication Sig Dispense Refill    Acetaminophen 500 MG Oral Tab 2 tablets bid      AmLODIPine Besylate 5 MG Oral Tab Take 1 tablet (5 mg) by mouth 2 times a day. 90 tablet 3    Atorvastatin Calcium 10 MG Oral Tab Take 1 tablet (10 mg) by mouth daily. 90 tablet 1    Cholecalciferol (VITAMIN D3) 2000 units Oral Cap Take 1 capsule (2,000 Units) by mouth daily. For low level 1 capsule 1    Clobetasol Propionate 0.05 % External Ointment Small amount to rash as needed bid for 1-2 weeks at a time (Patient not taking: Reported on 05/21/2017) 15 g 1    Cyanocobalamin 1000 MCG Oral Tab one per day over-the-counter started 5/21 /2012 this level borderline low 1 Tab 1    Diclofenac Sodium (VOLTAREN) 1 % Transdermal Gel Apply 2 g topically 4 times a day. Apply to neck, trapezius muscle, and low back. No more than 2g applied at a time. 1 Tube 5    DULoxetine HCl 30 MG Oral CAPSULE ENTERIC COATED PARTICLES Take 1 capsule (30 mg) by mouth daily. 90 capsule 1    DULoxetine HCl 60 MG Oral CAPSULE ENTERIC COATED PARTICLES Take 1 capsule (60 mg) by mouth daily. 90 capsule 1    EPINEPHrine 0.3 MG/0.3ML Injection Solution Auto-injector Inject as instructed per patient package insert, 0.3 mg intramuscularly or subcutaneously into the thigh, if needed to treat anaphylaxis 3 each 1    Lidocaine 5 % External Patch Apply 1 patch onto the skin daily. Apply to painful area for up to 12 hours in a 24 hour  period. 30 patch 10    Lisinopril 20 MG Oral Tab Take 1 tablet (20 mg) by mouth every 12 hours. 180 tablet 3    Methocarbamol 500 MG Oral Tab Take 1 tablet (500 mg) by mouth every 6 hours as needed for muscle spasms. 30 tablet 2    Naproxen Sodium 220 MG Oral Cap Take 2 capsules (440 mg) by mouth every 12 hours as needed. 1 capsule 1    Pregabalin 25 MG Oral Cap 4 po bid  02/15/2017 540 capsule 1    Primidone 50 MG Oral Tab Take 1 tablet (50 mg) by mouth 2 times a day. 180 tablet 0    THIAMINE HCL OR       Triamcinolone Acetonide 0.1 % External Cream As needed to right ear bid x 1 week 1 Tube 0     No facility-administered medications prior to visit.        REVIEW OF SYSTEMS - are otherwise negative unless as noted in HPI above or as additional issues below  Constitutional:     Eyes:    Head ears, nose mouth throat:   Cardiovascular:   Respiratory:   Gastrointestinal:   Genitourinary:    Musculoskeletal:    Integumentary:   Neurological:   Psychiatric/Behavioral:     Endocrine:   Hematological:   Allergic/Immunologic:     PHYSICAL EXAM  BP 110/80    Pulse 94    Temp 98.1 F (36.7 C) (Oral)    Resp 18    Ht 6' 3.98" (1.93 m)    Wt (!) 229 lb (103.9 kg)    SpO2 96%    BMI 27.89 kg/m   Well Dressed     Constitution, In general looks:   well , good coloring  No diaphoresis  Patient is Alert  & Attentive  Patient is conversive    Psychiatric Mental Status / Neurologic Mental Status:  Affect:  Pleasant calm  Mood:  Good  Awake: Alert and attentive    Not confused  Thought::     content : no gross issue   Process : logical  Language:   Speech is fluent  Intact comprehension    Eyes: no discharge, no gross puffiness, non- icteric    Cardiovascular:   Regular rate and rhythm no murmurs  No edema  Respiratory:   Not tachyphpnic  Clear to ascultation  No rales, rhonchi , without increased effort or stridor  No clubbing         Neurologic:   Grossly intact  Memory:   Attention       Cranial Nerve    Eyes pupil and lid are intact   II, III, IV, VIII   Facial Movement VII: grossly intact, no facial droop     Motor- Gait-coordination: get up and go normal, no gross weakness   Gait- base normal appropriate for age   Get up and go -no problem           Musculoskeletal:  He does not feel jittery   Derm   No gross bruising or rash or hives      ++++++++++++++++++++++++++++++++++++++++++++++++++  Allergy  Review of patient's allergies indicates:  Allergies   Allergen Reactions    Bee Venom Hives, Itching and Swelling    Gabapentin      Flu like symptoms, diarrhea, body ache upset stomach    Lidocaine Rash     Lidocaine patch. PATIENT STATES HE IS ALLERGIC TO THE ADHESIVE NOT THE PATCH.       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] 01/11/2017     Osteoarthritis of right hip x ray report - moderate to advanced radiology read 01/11/2017      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[facility]) [S06.9X9A] 08/06/2016    Impaired mobility and ADLs [Z74.09] 04/08/2016    Difficulty in walking, not elsewhere classified [R26.2] 03/12/2016    Vitamin D deficiency [E55.9] 02/13/2016    Balance problem [R26.89] 01/23/2016    Chronic pain of both shoulders [M25.511, G89.29, M25.512] 01/23/2016     X ray arthritis  02/19/2016        Pain of right hip joint [M25.551] 01/23/2016    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) [G91.2] 01/15/2016    Intraparenchymal hemorrhage of brain (HCC) [I61.9] 01/06/2016    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, 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, 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] 01/06/2016    Essential hypertension [I10] 12/16/2015    Essential tremor [G25.0] 12/16/2015    Hx of spontan intraparenchymal intracran bleed assoc with hypertension [Z86.79] 12/16/2015     [place] 4/29- 12/10/2015  Non-traumatic intraparenchymal hemorrhage, hypertensive of the left thalamus   Traumatic hemorrhage   -- falcine subdural hemorrhage, small cortical subarachnoid      Alcohol abuse [F10.10]  12/16/2015    Cerebral ventriculomegaly [G93.89] 10/09/2015    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    History of alcohol dependence (HCC) [F10.21] 06/26/2015    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) [G37.9] 05/07/2015    Idiopathic peripheral neuropathy [G60.9] 03/15/2015    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] 05/28/2014    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] 01/18/2014    Tremor [R25.1] 04/20/2013    Lumbosacral radiculopathy at S1 [M54.17] 03/22/2013     Left      Fall [W19.XXXA] 02/23/2013     BP < 100  Occasioanal tri[p and leg weakness      Back pain, lumbosacral [M54.5] 02/15/2013    Lumbar radiculopathy, chronic [M54.16] 02/15/2013    Neck pain [M54.2] 02/15/2013    Chronic pain [G89.29] 12/21/2012     Now at [facility], DO, Santiago Glad    Neck pain -burn c3-7 on left side per patient  Some trigger injection    Lumbar pain-since 80's better with exercise      Bee sting allergy [Z91.030] 12/21/2012     hives      Numbness and tingling of leg [R20.0, R20.2] 12/21/2012     Left calf -left foot long term suspect from back-suspect L 45      Chronic back pain [M54.9, G89.29] 12/21/2012     Mri in mid scape 1/214  Multilevel degenerative disc disease of the lumbar spine. Circumferential disc bulge at all levels below and including L1-2. No significant central spinal canal stenosis or neuroforaminal narrowing.  2. Mild retrolisthesis of L5 on S1.            B12 deficiency [E53.8] 12/21/2012     Borderline low 5 /21/2014      Chronic renal insufficiency, stage III (moderate) (HCC) [N18.3] 12/21/2012     5/ 2013      Hyperlipidemia [E78.5] 09/26/2012    Cervicalgia [M54.2] 03/11/2010     Radiofrequency  ablation of c3 to c 7           Past Medical History:   Diagnosis Date    Carotid Sinus Hypersensitivity     Disc disorder of lumbosacral region     HYPERTENSION      HYPOTENSION      Intraparenchymal hemorrhage of brain (HCC) 2017    Pain of  right hip joint 01/23/2016    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 myelopathy or radiculopathy 05/28/2014    Syncope     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(HCC)     URI (upper respiratory infection)        Social History     Social History    Marital status: Married     Spouse name: N/A    Number of children: N/A    Years of education: N/A     Occupational History    Not on file.     Social History Main Topics    Smoking status: Never Smoker    Smokeless tobacco: Former Neurosurgeon    Alcohol use Yes    Drug use: No    Sexual activity: Not on file     Other Topics Concern    Not on file     Social History Narrative    ** Merged History Encounter **              Family History     Problem (# of Occurrences) Relation (Name,Age of Onset)    Colon Cancer (1) Father    Heart (other) (1) Mother: MI    Other Family Hx (1) Other: myelodysplasia          Social History     Social History    Marital status: Married     Spouse name: N/A    Number of children: N/A    Years of education: N/A     Social History Main Topics    Smoking status: Never Smoker    Smokeless tobacco: Former Neurosurgeon    Alcohol use Yes    Drug use: No    Sexual activity: Not on file     Other Topics Concern    Not on file     Social History Narrative    ** Merged History Encounter **            Health Maintenance   Topic Date Due    Zoster Vaccine (2 of 3) 07/17/2013    Lipid Disorders Screening  02/11/2018    Depression Screening (PHQ-2)  05/21/2018    Diabetes Screening  02/12/2020    Colorectal Cancer Screening (Colonoscopy)  09/24/2020    Tetanus Vaccine  11/29/2025    Influenza Vaccine  Completed    Hepatitis C Screening  Addressed    HIV Screening  Addressed

## 2017-05-21 NOTE — Patient Instructions (Addendum)
Now new shingles vaccine is out called shingrix  It is a dead - not live vaccine    It is a series of 2 shots  We do not have this yet     I am not sure of your insurance coverage    You could ask you pharmacy , but if you get any shots at the pharmacy , please send us a fax or scan this to your ecare as we want to track it all

## 2017-05-26 ENCOUNTER — Encounter (HOSPITAL_BASED_OUTPATIENT_CLINIC_OR_DEPARTMENT_OTHER): Payer: PPO | Admitting: Clinical Neuropsychologist

## 2017-05-26 ENCOUNTER — Ambulatory Visit: Payer: PPO | Attending: Neurology | Admitting: Neurology

## 2017-05-26 VITALS — BP 123/81 | HR 95 | Temp 97.3°F | Ht 76.0 in | Wt 226.0 lb

## 2017-05-26 DIAGNOSIS — I6789 Other cerebrovascular disease: Secondary | ICD-10-CM

## 2017-05-26 DIAGNOSIS — G9389 Other specified disorders of brain: Secondary | ICD-10-CM | POA: Insufficient documentation

## 2017-05-26 DIAGNOSIS — I679 Cerebrovascular disease, unspecified: Secondary | ICD-10-CM | POA: Insufficient documentation

## 2017-05-26 DIAGNOSIS — Z6827 Body mass index (BMI) 27.0-27.9, adult: Secondary | ICD-10-CM

## 2017-05-26 NOTE — Progress Notes (Signed)
Note: Parts of this document have beencreated with voice recognition software.Although I have proofread the note, transcription errors may still exist.     Adult and Transitional Hydrocephalus and CSF Disorders    Reason for Visit:  Ronnie Mason is a 66 year old male who is here for a follow up visit for the purpose of reviewing his neuropsychology testing.  I last saw this patient on 04/02/16.  His wife is with him.  I have reviewed the prior chart notes on this patient.      History of Present Illness:  His first visit with me was on 02/21/16.  He had been incidentally discovered to have enlarged ventricles at the time that he had been admitted to [facility] in April 2017 for a small left thalamic hypertensive hemorrhage.  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  At the time of his first visit with me, he only had gait difficulty from low back problems, but no evidence of neurologic gait impairment.  He had no urinary symptoms.  His initial MoCA score was 20/30, but this increased to 26/30 at his follow-up visit with me on 04/02/16.  I recommended neuropsychology testing.  He had this done earlier this summer.    Interval changes in the medical or surgical history:  He continues to improve.  His gait is much better, and the only limitation is from pain.  He has had an injection in his hip which helped, and last week he had an epidural injection which she says was the best he had ever had for back pain.  He occasionally has urinary incontinence.  It seems as if most of the time this occurs if he has waited too long to go to the bathroom.  He says that his thinking and memory are better.  He does need some reminders from his wife.  He has no headaches.    Copies of the patient's complete medical history form and other documentation that I have reviewed, annotated, or created for this visit have been scanned into the  electronic medical record.    I personally reviewed and confirmed the past medical history and past surgical history in the record with the patient today. There are no other changes.    ROS    Medications  Outpatient Prescriptions Marked as Taking for the 05/26/17 encounter (Office Visit) with Tillman Abide, MD   Medication Sig Dispense Refill    Acetaminophen 500 MG Oral Tab 2 tablets bid      AmLODIPine Besylate 5 MG Oral Tab Take 1 tablet (5 mg) by mouth 2 times a day. 90 tablet 3    Atorvastatin Calcium 10 MG Oral Tab Take 1 tablet (10 mg) by mouth daily. 90 tablet 1    Cholecalciferol (VITAMIN D3) 2000 units Oral Cap Take 1 capsule (2,000 Units) by mouth daily. For low level 1 capsule 1    Clobetasol Propionate 0.05 % External Ointment Small amount to rash as needed bid for 1-2 weeks at a time 15 g 1    Cyanocobalamin 1000 MCG Oral Tab one per day over-the-counter started 5/21 /2012 this level borderline low 1 Tab 1    Diclofenac Sodium (VOLTAREN) 1 % Transdermal Gel Apply 2 g topically 4 times a day. Apply to neck, trapezius muscle, and low back. No more than 2g applied at a time. 1 Tube 5    DULoxetine HCl 30 MG Oral  CAPSULE ENTERIC COATED PARTICLES Take 1 capsule (30 mg) by mouth daily. 90 capsule 1    DULoxetine HCl 60 MG Oral CAPSULE ENTERIC COATED PARTICLES Take 1 capsule (60 mg) by mouth daily. 90 capsule 1    EPINEPHrine 0.3 MG/0.3ML Injection Solution Auto-injector Inject as instructed per patient package insert, 0.3 mg intramuscularly or subcutaneously into the thigh, if needed to treat anaphylaxis 3 each 1    Lidocaine 5 % External Patch Apply 1 patch onto the skin daily. Apply to painful area for up to 12 hours in a 24 hour period. 30 patch 10    Lisinopril 20 MG Oral Tab Take 1 tablet (20 mg) by mouth every 12 hours. 180 tablet 3    Methocarbamol 500 MG Oral Tab 1/2 to one at night for leg spasm 30 tablet 2    Naproxen Sodium 220 MG Oral Cap Take 2 capsules (440 mg) by mouth  every 12 hours as needed. 1 capsule 1    Pregabalin 100 MG Oral Cap Take 1 capsule (100 mg) by mouth 2 times a day. 180 capsule 1    Pregabalin 25 MG Oral Cap 1  po bid 05/21/2017 180 capsule 1    Primidone 50 MG Oral Tab Take 1 tablet (50 mg) by mouth 2 times a day. 180 tablet 0    THIAMINE HCL OR       Triamcinolone Acetonide 0.1 % External Cream As needed to right ear bid x 1 week 1 Tube 0       Allergies  Adhesives; Bee venom; and Gabapentin    Major Findings:  On examination today, this is a pleasant 66 year old-year-old male.      Vital signs:  BP 123/81    Pulse 95    Temp 97.3 F (36.3 C)    Ht 6\' 4"  (1.93 m)    Wt (!) 226 lb (102.5 kg)    SpO2 98%    BMI 27.51 kg/m     I reviewed the neuropsychology test results in person with Dr. Adolphus Birchwoodasher.  The 2 of us then counseled him and his wife regarding the findings.    Overall, we find difficulty that would be consistent with his previous small left thalamic hemorrhage, and evidence of cerebral microvascular disease seen on the prior scans.  He is able to encode memories, but has trouble with retrieval.  He benefits from cueing.  His visual memory is better than his verbal memory.  Most of the difficulty that he has is with verbal mediated tasks.  He also has a bit of psychomotor slowing on the right.  We see nothing to suggest a degenerative form of dementia.    Assessment:  I spent a total time of 40 minutes face-to-face with the patient, of which more than 50% was spent counseling and coordinating care as outlined in this note.    Dr. Adolphus Birchwoodasher and I explained our thoughts to him and his wife.  Sydnee CabalDavid Bruyere has mild cognitive difficulty as a consequence of his cerebral microvascular disease and his prior thalamic hemorrhage and mild traumatic brain injury.  From a practical and functional perspective, he has improved since April 2017.    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      Plan:  We reviewed the test results with them.  We in coverage tandem to make  use of notes and other forms of structure that will improve his functional memory.  We told him that we don't see anything to suggest a degenerative form of dementia.  I told them that the neuropsychology testing serves as a baseline.  With enlargement of the ventricle seen on the scan, it is always possible that he may develop worsening problems in the future.  Should this happen, the question will then be whether this is a new process, or simply worsening of the pre-existing deficits.    We will see him in follow-up in one year.  We don't need another scan for at least 2 years.    Casimiro Needle A. Mayford Knife, MD  Professor of Neurology and Neurological Surgery  Director, Adult and Transitional Hydrocephalus and CSF Disorders

## 2017-06-03 ENCOUNTER — Encounter (INDEPENDENT_AMBULATORY_CARE_PROVIDER_SITE_OTHER): Payer: Self-pay | Admitting: Internal Medicine

## 2017-06-03 ENCOUNTER — Ambulatory Visit: Payer: PPO | Attending: Physical Medicine & Rehabilitation | Admitting: Physical Medicine & Rehabilitation

## 2017-06-03 VITALS — BP 107/69 | HR 116 | Resp 16

## 2017-06-03 DIAGNOSIS — M5416 Radiculopathy, lumbar region: Secondary | ICD-10-CM

## 2017-06-03 NOTE — Progress Notes (Addendum)
ATTENDING PROCEDURE ADDENDUM / ATTESTATION  The above procedure was performed with the assistance Tyson Babinskirevor Gessel, Sports Medicine Fellow Physician. I was present for the entire procedure and directly supervised the procedure. I personally reviewed all available imaging and clinic notes prior to the procedure.     Park LiterBrian Traquan Duarte, MD  Attending Physician  [facility]  [facility] at [facility].

## 2017-06-03 NOTE — Progress Notes (Signed)
CC: Follow up after right L5 transforaminal epidural steroid injection    HPI:  Ronnie Mason is a very pleasant 66 year old man I last saw in clinic 05-19-17 for right L5 transforaminal epidural steroid injection.  Recall he had been having right leg pain for 2 years.  An MRI lumbar spine had demonstrated a diffuse disc bulge at L4 to 5 with some possible abutment of the L5 nerve root.  Since he had completed very appropriate nonsurgical management, we moved forward with a epidural steroid injection and he returns clinic today stating that his pain is completely resolved in his leg.  He has 0 out of 10 pain in the right leg.  While residual pain he does have is from his underlying neuropathy which she localizes primarily to his right foot.  He denies any side effects from the injection and reports that this was the "best injection" that he has had.  He denies any new weakness, bowel or bladder incontinence or saddle anesthesia.    He is currently taking methocarbamol one half a tablet (250 mg) at nighttime mainly for spasming in his leg.  He notes that while this is helpful, he feels drowsy and dizzy on this medication.  He recently went down to this half tablet per his PCP.    Past medical, past surgical, social, family hx: Reviewed with patient and is: No new changes since last visit    Medications:  Reviewed  as above    ROS:  A complete review of systems is negative except for what is documented in the HPI above and what is noted here:  No changes since last visit.     Physical Exam  Gen: In no acute distress.  Psych: Normal mood.  HEENT: EOMI. Normal cephalic, atruamatic  CVS: No clubbing, cyanosis, edema  Pulm: Non-labored respirations  Skin: Moist, no lesions  Neuro: Gait is non-ataxic. Strength 5/5 hip flexors, knee extensors, ankle dorsiflexors.  Decreased sensation over the lateral aspects of both legs and feet.  MSK:  Able walk on toes without difficulty  Full lumbar flexion and lumbar Center today without  significant reproduction of right low back or radiating leg pain    Imaging/Diagnostic studies:  MRI lumbar spine 08-08-16  -Diffuse disc bulge at L4-L5 but no significant compression in the lateral recess  -At L5-S1 No significant foraminal narrowing bilaterally    Procedures I have performed  RIGHT L5 TFESI 05-19-17    Impression:  Ronnie Mason is a 66 year old man with chronic 2 year hx of  right-sided low back, right buttock and radiating right leg pain, at this time possibly consistent with L5 radiculitis, pain completely resolved at this time with a L5 epidural steroid injection    Discussion/Plan:   Ronnie Mason is overall very pleased with the recent right L5 transforaminal epidural steroid injection.  He feels that this injection is completely taken away his right radiating leg pain that he had been having for 2 years.  I spent some time again reviewing the anatomy of his lumbar spine and his MRI images.  Again, there really is no significant compression of the L5 nerve root in the lateral recess or at the foramen at L5 but he may have simply had some inflammation around the nerve root which the epidural was targeting.      I also reminded him that I am not a pain physician in that for his other pain issues, he should either follow with Dr. Amada Jupiter at [facility] or he can be referred  to the Center for pain relief at [facility].  He notes that there is another pain physician, Dr. Araceli BouchePeperzak whom he plans on following up with.     - We'll hold on any repeat epidural steroid injections.  However if his symptoms do return, we certainly can consider repeat right L5 TF ESI  - No surgical indications at this time.  However remember he [facility] who did not feel that it was a surgical intervention.   - No new medications prescribed.  I advised him to speak with his primary care provider about considering discontinuing the methocarbamol since an accident dizzy and wraps trying cyclobenzaprine which she has used in the past for spasms in  his leg related to neuropathy.       Ronnie LiterBrian Lehman Whiteley, MD  Attending Physician  [facility]  [facility]

## 2017-06-10 ENCOUNTER — Telehealth (INDEPENDENT_AMBULATORY_CARE_PROVIDER_SITE_OTHER): Payer: Self-pay | Admitting: Internal Medicine

## 2017-06-10 DIAGNOSIS — M545 Low back pain: Secondary | ICD-10-CM

## 2017-06-10 DIAGNOSIS — M79604 Pain in right leg: Secondary | ICD-10-CM

## 2017-06-10 NOTE — Telephone Encounter (Signed)
Patient requesting referral to:    Provider name Drue SecondGretchen Lubin, Physical Therapy   Reason for referral low back pain , R side leg pain  Provider specialty Physical therapy  Number of Visits 24  [phone]  [place]   Phone [phone]  Fax call for fax   Insurance type [employer]  Have you previously been referred to this provider: yes

## 2017-06-11 NOTE — Telephone Encounter (Signed)
PT referral pended.    Pt had been referred to Drue SecondGretchen Lubin back on 04/09/16.

## 2017-06-11 NOTE — Telephone Encounter (Signed)
Done

## 2017-06-14 ENCOUNTER — Encounter (INDEPENDENT_AMBULATORY_CARE_PROVIDER_SITE_OTHER): Payer: Self-pay | Admitting: Internal Medicine

## 2017-06-14 DIAGNOSIS — M79673 Pain in unspecified foot: Secondary | ICD-10-CM

## 2017-06-15 ENCOUNTER — Encounter (INDEPENDENT_AMBULATORY_CARE_PROVIDER_SITE_OTHER): Payer: Self-pay | Admitting: Internal Medicine

## 2017-06-15 NOTE — Telephone Encounter (Signed)
Pended referral to Podiatry.

## 2017-06-28 ENCOUNTER — Encounter (INDEPENDENT_AMBULATORY_CARE_PROVIDER_SITE_OTHER): Payer: Self-pay | Admitting: Podiatrist

## 2017-06-28 ENCOUNTER — Ambulatory Visit (INDEPENDENT_AMBULATORY_CARE_PROVIDER_SITE_OTHER): Payer: PPO | Admitting: Podiatrist

## 2017-06-28 ENCOUNTER — Telehealth (INDEPENDENT_AMBULATORY_CARE_PROVIDER_SITE_OTHER): Payer: Self-pay | Admitting: Internal Medicine

## 2017-06-28 DIAGNOSIS — M2021 Hallux rigidus, right foot: Secondary | ICD-10-CM

## 2017-06-28 DIAGNOSIS — G250 Essential tremor: Secondary | ICD-10-CM

## 2017-06-28 MED ORDER — PRIMIDONE 50 MG OR TABS
50.0000 mg | ORAL_TABLET | Freq: Two times a day (BID) | ORAL | 0 refills | Status: DC
Start: 2017-06-28 — End: 2017-12-29

## 2017-06-28 NOTE — Telephone Encounter (Signed)
Refill Request    Last visit: 05/21/2017  Next visit: Visit date not found  Last refill: 03/08/17  Last Prescribed by: Dr.Kraft  Labs:     Results for orders placed or performed during the hospital encounter of 11/30/15   BASIC METABOLIC PANEL   Result Value Ref Range    Sodium 135 135 - 145 meq/L    Potassium 4.0 3.6 - 5.2 meq/L    Chloride 99 98 - 108 meq/L    Carbon Dioxide, Total 28 22 - 32 meq/L    Anion Gap 8 4 - 12    Glucose 161 (H) 62 - 125 mg/dL    Urea Nitrogen 20 8 - 21 mg/dL    Creatinine 1.611.05 0.960.51 - 1.18 mg/dL    Calcium 9.7 8.9 - 04.510.2 mg/dL    GFR, Calc, European American >60 mL/min/[1.73_m2]    GFR, Calc, African American >60 mL/min/[1.73_m2]    GFR, Information       Calculated GFR in mL/min/1.73 m2 by MDRD equation.  Inaccurate with changing renal function.  See [email or web address]

## 2017-06-28 NOTE — Progress Notes (Signed)
Ronnie Mason  PCP:  Shann MedalKraft, Vara Viseskul, MD     Visit type: New patient extended visit for evaluation of chronic symptoms with his right great toe joint.    Appointment start time: 11:00 AM  Appointment end time: 11:45 AM    CC:  66 year old male with   Chief Complaint   Patient presents with    Foot Pain     NP - R foot 1st mtp joint pain ongoing a year, NKI, swelling, difficult to wear shoes.  XR prior.         HPI:  66 year old male with a complex medical history, multiple problems comes in today with a specific complaint right great toe joint pain.  About a year and a half ago he had an injury where he fell and had a head injury.  [facility].  When he was improving from that injury he noticed that his foot was very painful.  He did not have any problems there previously.  X-rays were taken showing no fracture of the toe but his great toe joint I was bothering him.  That's progressively become worse with time where the joint swollen, red and uncomfortable.  It bothers him both with shoes and without.  Bending the toe is uncomfortable.  He hurts both plantarly and dorsally.  He does have a history of chronic pain and also has a history of some peripheral neuropathy although the symptoms.  The more mechanical and joint related from a musculoskeletal standpoint.  No recent x-rays.    Review of patient's allergies indicates:  Allergies   Allergen Reactions    Adhesives Rash    Bee Venom Hives, Itching and Swelling    Gabapentin      Flu like symptoms, diarrhea, body ache upset stomach       has a current medication list which includes the following prescription(s): acetaminophen, amlodipine besylate, atorvastatin calcium, vitamin d3, clobetasol propionate, cyanocobalamin, diclofenac sodium, duloxetine hcl, duloxetine hcl, epinephrine, lidocaine, lisinopril, methocarbamol, naproxen sodium, pregabalin, pregabalin, primidone, thiamine hcl, and triamcinolone acetonide.     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     has a past surgical history that includes UNLISTED PROCEDURE HANDS/FINGERS; UNLISTED PROCEDURE FEMUR/KNEE; ANES; COLONOSCOPY (2002); ANES; COLONOSCOPY & POLYPECTOMY (11/18/2007); and UNLISTED PROCEDURE SPINE (2013).    family history includes Colon Cancer in his father; Heart (other) in his mother; Other Family Hx in an other family member.    Social History     Social History    Marital status: Married     Spouse name: N/A    Number of children: N/A    Years of education: N/A     Occupational History    Not on file.     Social History Main Topics    Smoking status: Never Smoker    Smokeless tobacco: Former NeurosurgeonUser    Alcohol use Yes    Drug use: No    Sexual activity: Not on file     Other Topics Concern    Not on file     Social History Narrative    ** Merged History Encounter **              ROS:  ROS is positive for musculoskeletal as mentioned in the HPI.  All other symptoms reviewed are negative and noncontributory.  Documented in EHR on 06/28/2017    PHYSICAL EXAM    GENERAL: Alert and oriented to person, place, and time.  No acute distress.    VASCULAR:  Palpable dorsalis pedis and posterior tibial pulses, capillary refill immediate to toes.  No signs of ischemia.    NEUROLOGICAL:  Epicritic sensation intact, no neuritic symptoms provoked with exam.    DERMATOLOGICAL:  No skin lesions, no erythema, no increased warmth.    MUSCULOSKELETAL:   Right great toe joint with stiffness and joint effusion with apparent osteophytes on palpation exam.  Some sesamoid tenderness.  Flexion is restricted to about 15 or 20 and is painful across the dorsal  joint line.  No problems at the hallux interphalangeal joint.  Contralateral foot normal.    IMAGING:  Xr Foot 3 Vw Right    Result Date: 06/28/2017  3 views of the right foot were taken to evaluate the great toe joint.  He has some joint space narrowing especially at the medial joint.  Some marginal osteophytes are present.  He does have a little bit a subchondral lucency right at the central aspect of the metatarsal head and a bit on the phalanx.  On the medial oblique view there does appear to be some osteophytes that have become free and possibly wedged a bit in the side of the joint.    ASSESSMENT:  (M20.21) Hallux rigidus of right foot  (primary encounter diagnosis)    PLAN:  Patient and wife education on the nature of the problem and options with respect to treatment.    Normally with this problem I would rest and protect his foot in a walking boot.  However he has a left knee problem and is recently had an MRI this morning to evaluate that for possible meniscus injury.  He also has chronic low back issues and I'm reticent to put him in any sort of gait altering device as these issues could flare.  Until we know more about his knee I would recommend we hold off on protecting the foot in that manner.  As such were limited to a good supportive pair shoes such as a rocker soled shoe like the Hoka.  This can rest and protect his toe.  He'll also need to back off activities.  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    It is possible the joint could continue to hurt and joint cleanup with removal of loose body could be necessary.  I can't guarantee this would improve symptoms or eliminate them as he does have some arthrosis to the joint developing.  It's possible this could progress over time with or without current treatment.  There are times at arthrodesis can be necessary.  We are hopeful to  avoid this in his particular case although it could become necessary over time if the joint undergoes further degeneration.  I reviewed all of this with him and his wife took notes.  I also gave him our handout on arthrosis to help them understand the nature of joint injury that he has.  Also gave him a referral to sole perfection shoe store to look at shoes.  Follow-up in 3 weeks.    45 minutes were spent face-to-face contact with the patient, greater than 50%  of which was spent counseling and coordinating care.        Rolly Salter, DPM  [facility]    Portions of this document were created with voice-recognition software and may contain occasional errant or misspelled words.

## 2017-07-01 ENCOUNTER — Ambulatory Visit: Payer: PPO | Attending: Neurology | Admitting: Neurology

## 2017-07-01 VITALS — BP 134/96 | HR 94 | Temp 98.4°F | Ht <= 58 in | Wt 231.1 lb

## 2017-07-01 DIAGNOSIS — Z6841 Body Mass Index (BMI) 40.0 and over, adult: Secondary | ICD-10-CM

## 2017-07-01 DIAGNOSIS — G609 Hereditary and idiopathic neuropathy, unspecified: Secondary | ICD-10-CM | POA: Insufficient documentation

## 2017-07-01 NOTE — Progress Notes (Addendum)
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 The main question under consideration is whether the gait difficulty is coming more from peripheral neuropathy, radiculopathy, NPH, or other.    Please see our last note 03/29/17 for further details.      Interval History: On 05/19/2017 fluoroscopic guided right L5 transforaminal epidural steroid injection. He reports that the epidural steroid injection was wonderful with Dr. [name], [facility] through [facility]. Since then, he's not really getting back pain or shooting pains. The left knee meniscus may be torn from wrenching it moving his dog and he was vomiting from the knee pain, may have also had GI bug. In addition, the right great MCP is painful from osteophytes.    Right calf is spasming frequently. The previously reported shooting pains are better in the legs. Methocarbamol made him woozy and hallucinations. Wants to know if diclofenac tablets a possibility as the gel helps somewhat but temporarily and only very local. The foot numbness persists.    ROS: + Bruising over the right eye from a fall into a doorjamb. Not sure how that happened.    SH/FH: Occasionally, one glass of wine at dinner. 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 He has slowed down this work a little, but not entirely.    Targeted exam:  BP (!) 134/96    Pulse 94    Temp 98.4 F (36.9 C) (Temporal)    Ht 4\' 6"  (1.372 m)    Wt (!) 231 lb 2 oz (104.8 kg)    SpO2 95%    BMI 55.73 kg/m     Gen: Pleasant, cooperative man, NAD, feeling better.  Motor: Full 5/5 strength at BL: deltoid, biceps, triceps, WE, WF, IO, APB, FE, FF; HF, KE, KF, DF, PF  Normal bulk and tone. No spasticity, fasciculations or atrophy. No pronator  drift.  Sensory: Vibration: 2.5 right MM, 1 sec left MM; Proprioception: 2/4 bilaterally; Romberg negative but with pseudoathetosis of the toe  Reflexes: Patellars today again asymmetric, right 2+, left 1+, achilles 1/0. Facilitated by Jendrassik maneuver.  Coordination: Today, without resting, postural, or intention tremor except very slight at end arm-extension bilaterally (fine sinusoidal). No nystagmus, ataxia, titubation, scanning speech. No slowing of movements.  Gait and station: Tall man, able to stand upright without kyphosis or scoliosis. Can stand with feet together (eyes open), but feels unstable. Gait is slightly wide based with foot eversion, but no significant steppage, ataxia, magnetic quality, mincing, or circumduction. No shuffling.     Medications:  Outpatient Medications Prior to Visit   Medication Sig Dispense Refill    Acetaminophen 500 MG Oral Tab 2 tablets bid      AmLODIPine Besylate 5 MG Oral Tab Take 1 tablet (5 mg) by mouth 2 times a day. 90 tablet 3    Atorvastatin Calcium 10 MG Oral Tab Take 1 tablet (10 mg) by mouth daily. 90 tablet 1    Cholecalciferol (VITAMIN D3) 2000 units Oral Cap Take 1 capsule (2,000 Units) by mouth daily. For low level 1 capsule 1    Clobetasol Propionate 0.05 % External Ointment Small amount to rash as needed bid for 1-2 weeks at a time 15 g 1    Cyanocobalamin 1000 MCG Oral Tab one  per day over-the-counter started 5/21 /2012 this level borderline low 1 Tab 1    Diclofenac Sodium (VOLTAREN) 1 % Transdermal Gel Apply 2 g topically 4 times a day. Apply to neck, trapezius muscle, and low back. No more than 2g applied at a time. 1 Tube 5    DULoxetine HCl 30 MG Oral CAPSULE ENTERIC COATED PARTICLES Take 1 capsule (30 mg) by mouth daily. 90 capsule 1    DULoxetine HCl 60 MG Oral CAPSULE ENTERIC COATED PARTICLES Take 1 capsule (60 mg) by mouth daily. 90 capsule 1    EPINEPHrine 0.3 MG/0.3ML Injection Solution Auto-injector Inject as instructed per patient  package insert, 0.3 mg intramuscularly or subcutaneously into the thigh, if needed to treat anaphylaxis 3 each 1    Lidocaine 5 % External Patch Apply 1 patch onto the skin daily. Apply to painful area for up to 12 hours in a 24 hour period. 30 patch 10    Lisinopril 20 MG Oral Tab Take 1 tablet (20 mg) by mouth every 12 hours. 180 tablet 3    Methocarbamol 500 MG Oral Tab 1/2 to one at night for leg spasm 30 tablet 2    Naproxen Sodium 220 MG Oral Cap Take 2 capsules (440 mg) by mouth every 12 hours as needed. 1 capsule 1    Pregabalin 100 MG Oral Cap Take 1 capsule (100 mg) by mouth 2 times a day. 180 capsule 1    Pregabalin 25 MG Oral Cap 1  po bid 05/21/2017 180 capsule 1    Primidone 50 MG Oral Tab Take 1 tablet (50 mg) by mouth 2 times a day. 180 tablet 0    THIAMINE HCL OR       Triamcinolone Acetonide 0.1 % External Cream As needed to right ear bid x 1 week 1 Tube 0     No facility-administered medications prior to visit.      CBD cream provides 6 hours of relief to the left knee.  Diclofenac 75 mg twice a day helps with muscle stiffness/soreness, but does not really prevent spasming.    PMH:    Patient Active Problem List   Diagnosis    Syncope    Hypotension    Carotid Sinus Hypersensitivity    URI (upper respiratory infection)    Cervicalgia    Hyperlipidemia    Chronic pain    Bee sting allergy    Numbness and tingling of leg    Chronic back pain    B12 deficiency    Chronic renal insufficiency, stage III (moderate) (HCC)    Back pain, lumbosacral    Lumbar radiculopathy, chronic    Neck pain    Fall    Tremor    Sensory neuropathy    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 myelopathy or radiculopathy    Idiopathic peripheral neuropathy    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 [facility](HCC)    History of alcohol dependence (HCC)    Cerebral ventriculomegaly    Cerebellar hypoplasia (HCC)    Essential hypertension    Essential tremor    Hx of spontan intraparenchymal intracran bleed  assoc with hypertension    Alcohol abuse    Intraparenchymal hemorrhage of brain (HCC)    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 Right peroneal and tibial motor NCSs showed normal compound muscle action potential (CMAP) amplitudes, normal onset latencies, and normal conduction velocities.  1.   Right tibial and peroneal F-waves showed normal latencies.    2.   Right sural nerve sensory NCS was absent    3.   Left sural nerve antidromic sensory NCS showed decreased amplitude sensory nerve action potential and conduction velocity.    4.   EMG of right tibialis anterior, gastrocnemius, and extensor digitorum brevis showed no abnormal spontaneous activity, normal morphology motor unit action potentials (MUAPs) with normal recruitment .       IMPRESSION : There is electrodiagnostic evidence of a length dependent sensory axonal-predominant polyneuropathy, and no electrodiagnostic evidence of L5-S1 radiculopathy or CIDP.   Melvenia BeamKatrina Bernardo, MD Neuromuscular Fellow     Assessment/Recommendations:    66 yo man with length dependent polyneuropathy and (now improved) sharp neuropathic pain as well as (improved) back pain and radiating pain after L5 transforaminal steroid injection; main pain currently is from the spasming of the calves. On repeat nerve conduction/EMG, there is no evidence of a demyelinating disease such as CIDP, nor of an active radiculopathy or active denervation.    The spasming is not likely coming from nerves, it is probably coming from muscles. The toe  and knee issues are likely joint related rather than nerve related. In general, the gait issues are likely evolving sequelae of stroke combined with sensory ataxia from the numbness/neuropathy, as well as back pain (the latter is greatly improved with steroid injections). The spasming may be tough to treat.      1. K-Mg-Ca pill daily (available OTC) for spasming -- or per Dr. Marisa SprinklesKraft  2. Stretch the calves out every night before bed  3. Apply diclofenac gel & stretch/massage when spasming  4. Would not recommend systemic diclofenac if not currently helping  5. Continue duloxetine and pregabalin per Dr. Marisa SprinklesKraft    At this point, we do not need to see [name] back in this clinic because there is no evidence of a demyelinating neuromuscular disease and the neuropathy-pain is improved, thus is being well managed by Dr. Marisa SprinklesKraft.     RTC: As needed if further questions arise.

## 2017-07-01 NOTE — Patient Instructions (Addendum)
Your EMG showed that the neuropathy is only sensory, not affecting your motor (movement) nerves. The nerves in the back are also not pinched. This likely makes you unstable and less able to feel your feet. The duloxetine and pregabalin are the right medications for neuropathic pain, but only mask the symptoms.    1. Can try potassium, magnesium, calcium combined pill for spasming or per Dr. Marisa SprinklesKraft  2. Stretch the calves out every night before bed  3. Apply diclofenac gel & stretch when spasming  4. Would not recommend systemic diclofenac if not currently helping  5. Continue duloxetine and pregabalin per Dr. Marisa SprinklesKraft  6. Limit alcohol as it can damage your nerves over time  7. To try to slow down the progression of your neuropathy, you can try a daily alpha-lipoic acid supplement which is available over the counter (this is taken for life as there is no way to measure the net benefit over time)    At this point, we do not need to see you back in this clinic because your neuropathy is being well managed by Dr. Marisa SprinklesKraft. The spasming is not likely coming from your nerves, it is probably coming from your muscles. The toe and knee issues are likely joint related rather than nerve related. Do come back if further questions arise.

## 2017-07-03 HISTORY — PX: SURGICAL HX OTHER: 99

## 2017-07-04 NOTE — Progress Notes (Signed)
I saw and evaluated the patient and agree with Dr.Schwarz's note.

## 2017-07-09 ENCOUNTER — Encounter (INDEPENDENT_AMBULATORY_CARE_PROVIDER_SITE_OTHER): Payer: Self-pay | Admitting: Otolaryngology

## 2017-07-09 ENCOUNTER — Ambulatory Visit (INDEPENDENT_AMBULATORY_CARE_PROVIDER_SITE_OTHER): Payer: PPO | Admitting: Otolaryngology

## 2017-07-09 VITALS — BP 118/74 | HR 86 | Resp 14

## 2017-07-09 DIAGNOSIS — H6061 Unspecified chronic otitis externa, right ear: Secondary | ICD-10-CM

## 2017-07-09 NOTE — Progress Notes (Signed)
OFFICE VISIT      PATIENT:  Ronnie Mason  DOB:  05/15/1951  MRN:  [id number]      CHIEF COMPLAINT/HPI:  Patient is a 66 year old male who presented with right itchy ear for 1 year.  Symptoms are moderate,   continuous, and unchanged.  It is located lateral to the ear canal.  He had right serous otitis media in   2015 with SENT.  Symptoms improved temporarily with clobetasol cream prescribed by PCP.  He   denies otalgia, otorrhea, vertigo, tinnitus, and hearing loss.  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      Prior testing:   Audiogram 05/25/14: AD mod-severe mixed HL, AS mild high freq SNHL, tymp DNT, SDS 100% AD, 90% AS   CT temporal bone:   MRI brain 04/02/16: As compared to 11/30/2015, unchanged enlargement of the lateral, third, and   fourth ventricles in a pattern most in keeping with noncommunicating   hydrocephalus such as normal pressure hydrocephalus. :       PMHX:  Past Medical History:   Diagnosis Date    Carotid Sinus Hypersensitivity     Disc disorder of lumbosacral region     HYPERTENSION      HYPOTENSION      Intraparenchymal hemorrhage of brain (HCC) 2017    Pain of right hip joint 01/23/2016    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 myelopathy or radiculopathy 05/28/2014    Syncope     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(HCC)     URI (upper respiratory infection)      PSHX:  Past Surgical History:   Procedure Laterality Date    ANES; COLONOSCOPY  2002    repeat in 3 years    ANES; COLONOSCOPY & POLYPECTOMY  11/18/2007    repeat in 3 years    UNLISTED PROCEDURE FEMUR/KNEE      UNLISTED PROCEDURE HANDS/FINGERS      UNLISTED PROCEDURE SPINE  2013    rfa to c3 to c 7      FHX: @HXFAMILY @  SHX:  Social History     Social History    Marital status: Married     Spouse name: N/A    Number of children: N/A    Years of education: N/A     Occupational History    Not on file.     Social History Main Topics    Smoking status: Never Smoker    Smokeless tobacco: Former NeurosurgeonUser     Alcohol use Yes    Drug use: No    Sexual activity: Not on file     Other Topics Concern    Not on file     Social History Narrative    ** Merged History Encounter **            ALLERGIES:  is allergic to adhesives; bee venom; and gabapentin.    MEDICATIONS:  has a current medication list which includes the following prescription(s): acetaminophen, amlodipine besylate, atorvastatin calcium, vitamin d3, clobetasol propionate, cyanocobalamin, diclofenac sodium, duloxetine hcl, duloxetine hcl, epinephrine, lidocaine, lisinopril, methocarbamol, naproxen sodium, pregabalin, pregabalin, primidone, thiamine hcl, and triamcinolone acetonide.      REVIEW OF SYSTEMS:    Constitutional: denies fatigue, fever, chills, sweats   Eye: negative  ENT: + itchy ear,  Denies excessive cerumen, infections, otalgia, tinnitus, noise exposure, Nose/Sinus: anosmia/hyposmia, nasal drainage, epistaxis, facial pain, nasal obstruction, rhinorrhea, sinusitis, sneezing, Throat: taste change, voice change, hoarseness, dysphagia, lump in throat,  odynophagia, post-nasal drainage, sore tongue, mouth sores, pharyngitis, snoring, tooth pain   Cardiovascular: negative   Respiratory:  negative  Gastrointestinal:  Negative  Endocrine:  negative    Neurological: negative  Psychiatric:  negative   Integumentary: negative and no rashes nor lesions of concern   Musculoskeletal: negative   Hematologic: negative  Immunologic: no known environmental allergies       PHYSICAL EXAMINATION:  Vitals:    07/09/17 0836   BP: 118/74   BP Cuff Size: Regular   BP Site: Left Arm   BP Position: Sitting   Pulse: 86   Resp: 14   SpO2: 97%        General: Well developed, appearing stated age and in no acute distress.  Participated in the patient interview appropriately.  Eyes: Clear conjunctive, normal lids. Pupils equal, round and reactive to light.  Extraocular movements intact.  Ears: visualized by microscopy.               Right: Pinna unremarkable.  External ear canal  with mild dry flaky skin. No cerumen. Tympanic membrane intact and mobile without retraction or perforation. Middle ear without fluid. No hearing loss.                   Left: Pinna unremarkable.  External ear canal without masses or skin abnormality. No cerumen. Tympanic membrane intact and mobile without retraction or perforation. Middle ear without fluid. No hearing loss.      Nose:  Nasal mucosa normal with clear mucus.  Midline septum with no polyps or lesions.  Mild turbinate hypertrophy.  OP: Clear without masses or ulcerations. Normal lips, teeth, and gums.  Normal hard palate, soft palate, and posterior pharynx. No parotid or submandibular gland tenderness, masses, or swelling.    HP:  Clear without mucosal abnormalities or masses  Neck: Supple and without masses, midline trachea, normal thyroid without enlargement, tenderness, and masses.  Lymphatic: normal submandibular, cervical, and supraclavicular lymph nodes    Skin: No rashes   Neuro:  CN II-XII grossly intact (VII Movements of facial expression bilaterally intact and symmetrical)  Cardiovascular: Regular rate and rhythm, normal carotid pulses  Lungs: normal effort  Extremities: No edema                    IMPRESSION/PLAN: AD chronic otitis externa  Trial hydrocortisone cream, Lotrimin cream prn ear itching      Doreene NestKaren  Ahman Dugdale, MD      Cc:   Judi SaaKraft, Vara Viseskul (General)  Macario CarlsVara V Kraft, MD  [id number]  [place], [place] [id number]

## 2017-07-09 NOTE — Patient Instructions (Addendum)
1% hydrocortisone cream, apply to itchy ears 2 times a day as needed, do not use more than 5-7 days in a row  Clotrimazole cream - anti-fungal, may apply to itchy ears 2 times a day as needed        Itchy ears are a very common problem. For some people the problem is so bad that they stick various objects into the ears, causing trauma to the ear canal. The most common causes of itching are a nervous habit, fungal infection or the beginning of an infection. Other causes can be skin diseases such as psoriasis or dermatitis. Some people with allergies complain of Itchy ears.  The ear canal may be normal on examination or there may be scaling of the skin. People aggravate the problem by using things such as bobby-pins, coat hangers and tooth picks to scratch the ear. This can produce abrasions of the ear canal. Any break in the skin can allow bacteria to enter through this protective barrier. The ear will then become infected requiring it to be treated the same as swimmer's ear.  Itching by itself without evidence of trauma or infection can be treated with a mild steroid ear drop. A few drops placed in the ear will help to decrease the amount of itching. Another helpful treatment is the use of 70% alcohol (rubbing alcohol) as an ear drop. CAUTION: itchy ears may be the first signal that an infection is developing. If the ear is infected the alcohol will burn.

## 2017-07-21 ENCOUNTER — Other Ambulatory Visit: Payer: Self-pay | Admitting: Internal Medicine

## 2017-07-21 ENCOUNTER — Ambulatory Visit (INDEPENDENT_AMBULATORY_CARE_PROVIDER_SITE_OTHER): Payer: PPO | Admitting: Podiatrist

## 2017-07-21 ENCOUNTER — Encounter (INDEPENDENT_AMBULATORY_CARE_PROVIDER_SITE_OTHER): Payer: Self-pay | Admitting: Podiatrist

## 2017-07-21 DIAGNOSIS — I619 Nontraumatic intracerebral hemorrhage, unspecified: Secondary | ICD-10-CM

## 2017-07-21 DIAGNOSIS — G609 Hereditary and idiopathic neuropathy, unspecified: Secondary | ICD-10-CM

## 2017-07-21 DIAGNOSIS — M2021 Hallux rigidus, right foot: Secondary | ICD-10-CM

## 2017-07-21 MED ORDER — AMLODIPINE BESYLATE 5 MG OR TABS
5.0000 mg | ORAL_TABLET | Freq: Two times a day (BID) | ORAL | 1 refills | Status: DC
Start: 2017-07-21 — End: 2017-10-12

## 2017-07-21 NOTE — Progress Notes (Signed)
Ronnie Mason  PCP:  Shann MedalKraft, Vara Viseskul, MD     Visit type: Established patient extended visit for follow-up of his right great toe joint in the context of the left knee pain and a complex medical history.    SUBJECTIVE:  Ronnie Mason is having a lot of problems.  See my previous notes.  He's getting ready to have left knee surgery with a meniscus this upcoming Friday.  His right great toe joints feeling a little bit better having up purchase some [employer] shoes and using a stiff soled hiking boot.  He has a graphite insert that he wears in the [employer] shoe although that's probably a bit redundant as this has a stiff last.  He cannot fitted into the hiking boot as it will not tender around the corner and he can inserted all the way.      OBJECTIVE:   Right great toe joint was some stiffness and he has an effusion.  He has a little bit of loose body laterally and is some osteophyte buildup.  He does have some lucency to the metatarsal head consistent with a little bit cartilage lesion here.  Grade 2 arthrosis of this joint.    ASSESSMENT:  (M20.21) Hallux rigidus, right foot  (primary encounter diagnosis)    PLAN:  He's having his knee surgery so we will let him get through that.  Once he normalizes gait on the left side and hopeful the right toe will feel better.  He's in a good supportive shoe and hiking boot at this point in time and that should help.  There can be a curative effective lessening stress on the joint over time.  I don't want to put him in a walking boot for the right great toe joint rainout disease having left knee surgery and I do not want through off his gait.  His symptoms don't really warranted at this point either.  We will see how things go in about a month once he is recovered from the meniscus surgery.  If he still having persistent issues we'll determine whether shoe modifications or inserts could be helpful versus a walking boot.  If he does have surgery he would need a joint cleanup surgery.   His joints not bad enough to consider fusion.  It's possible that could help quite a bit but is also possible the joint could have continued symptoms and upper rest with arthrosis over time requiring arthrodesis a later date.  We like to avoid surgery if possible as she's had quite a lot of issues going on in recent years and I'm hopeful that conservative therapy can lessen his symptoms.    45 minutes were spent face-to-face contact with the patient, greater than 50% of which was spent counseling and coordinating care.        Miguel RotaBrian D. Darilyn Storbeck, DPM  [facility]    Portions of this document were created with voice-recognition software and may contain occasional errant or misspelled words.

## 2017-08-09 ENCOUNTER — Other Ambulatory Visit (INDEPENDENT_AMBULATORY_CARE_PROVIDER_SITE_OTHER): Payer: Self-pay | Admitting: Internal Medicine

## 2017-08-09 DIAGNOSIS — E785 Hyperlipidemia, unspecified: Secondary | ICD-10-CM

## 2017-08-10 MED ORDER — ATORVASTATIN CALCIUM 10 MG OR TABS
10.0000 mg | ORAL_TABLET | Freq: Every day | ORAL | 2 refills | Status: DC
Start: 2017-08-10 — End: 2018-05-23

## 2017-08-10 NOTE — Telephone Encounter (Signed)
Patient last seen on 05/21/17 for visit and was to return in 2 months after birthday.  One refill authorized.  Please schedule follow up visit.

## 2017-08-11 NOTE — Telephone Encounter (Signed)
LM to infom patient to schedule follow up as advised in 05/21/2017 with VK.

## 2017-08-25 ENCOUNTER — Encounter (INDEPENDENT_AMBULATORY_CARE_PROVIDER_SITE_OTHER): Payer: Self-pay | Admitting: Podiatrist

## 2017-08-25 ENCOUNTER — Ambulatory Visit (INDEPENDENT_AMBULATORY_CARE_PROVIDER_SITE_OTHER): Payer: PPO | Admitting: Podiatrist

## 2017-08-25 DIAGNOSIS — M2021 Hallux rigidus, right foot: Secondary | ICD-10-CM

## 2017-08-25 NOTE — Progress Notes (Signed)
Ronnie Mason  PCP:  Shann MedalKraft, Vara Viseskul, MD     Visit type: Established patient extended visit in conjunction with his wife for surgical discussion right great toe joint.    SUBJECTIVE:  Ronnie Mason has grade 2 arthrosis of his right great toe joint with hallux rigidus.  This is been bothering him but he is also has some concurrent left knee problems and was getting ready to undergo surgery with him meniscus repair when I last saw him.  I recommended he get through that surgery and recover as he would need to be nonweightbearing on this right side for any procedure we did.  He had the surgery on his left knee.  He has not started physical therapy yet, that starts tomorrow.  He has a visit with his orthopedist, Dr. Marcelle OverlieHolland, next week.  Today he is requesting to move forward with his right great toe joint despite not having rehabilitated his left knee all the way yet.      OBJECTIVE:   Right great toe joint has some stiffness and pain along the dorsal joint line.  Joint effusion is present.  Review of his x-ray shows a loose body within the joint and some mild joint space narrowing and spurring laterally.    ASSESSMENT:  (M20.21) Hallux rigidus of right foot  (primary encounter diagnosis)    PLAN:  I feel is premature to operate on his right side as he would need for weeks postoperative no way to have followed by weightbearing in a walking boot, possibly extending even the fiber 6 weeks before he could walk in issue.  This would put increased stress on his left knee that he just had that fixed recently and has not even had physical therapy yet.  Certainly he needs a procedure on his right great toe joint but the timing of this is keep.  We don't want his recovery from the foot surgery to interview with his left knee rehabilitation nor vice versa.  He starting physical therapy and also has a follow-up with his orthopedist in one week.  I wrote a note to his orthopedist Dr. Marcelle OverlieHolland regarding the plans for his right  foot and the necessary nonweightbearing.  I like it is okay with respect to the left knee to proceed with the right foot.  We want him to be adequately prepared to accept full body weight on that left knee before we operate on the foot.  I reviewed all of this with he and his wife.    When we were about two thirds of the way through this discussion to still finishing it he brought up a completely separate problem was some right ankle issues.  He pointed to the area just below the lateral malleolus asking "what is this".  He was pointing near the peroneal tendons.  He's never really complained of pain here before any of his visits but does have a history of neuropathy and back issues including radiculopathy I believe.  We were part way through his discussion regarding the great toe joint when he changed subjective this and I deferred this conversation and workup to another time.  There is simply no way to get through his current consultation adequately and also respect the time of other patients who are here for visits.  I'm happy to work that problem up at another visit if he would like to schedule it.  I can't predict that his great toe joint will help him in any capacity on the lateral  ankle and I did tell him this.  Brief visual inspection of the area revealed no swelling or effusion, no obvious issues with the peroneal tendons although he was pointing to that location.  I suspect he may have some radicular or neuropathic issues causing him symptoms in that location.    30 minutes were spent face-to-face contact with the patient, greater than 50% of which was spent counseling and coordinating care.        Ronnie Mason, DPM  [facility]    Portions of this document were created with voice-recognition software and may contain occasional errant or misspelled words.

## 2017-08-30 ENCOUNTER — Telehealth (HOSPITAL_BASED_OUTPATIENT_CLINIC_OR_DEPARTMENT_OTHER): Payer: Self-pay

## 2017-08-30 DIAGNOSIS — M5416 Radiculopathy, lumbar region: Secondary | ICD-10-CM

## 2017-08-30 NOTE — Telephone Encounter (Signed)
Okay with directly scheduling a repeat right L5 TF ESI.  In his left Kling 06-03-17 knee noted complete relief of typical pain after injection on 05-19-17 and we had discussed repeating this injection in the future.    Order placed.  Schedule with me.    Park LiterBrian Kanija Remmel, MD  Attending Physician  [facility]  [facility]

## 2017-08-30 NOTE — Telephone Encounter (Signed)
Received a VM from patient requesting Dr. Gustavus BryantLiem to do another injection. No further details given    RTC to patient.  LVM requesting he contact Dr. Lysle RubensLiem's office at [facility] to inquire about further treatment and requested injection.  Provided [facility]'s number to call [phone]

## 2017-08-31 ENCOUNTER — Telehealth (INDEPENDENT_AMBULATORY_CARE_PROVIDER_SITE_OTHER): Payer: Self-pay | Admitting: Internal Medicine

## 2017-08-31 NOTE — Telephone Encounter (Signed)
LM @ 9:14 AM. this appnt is saved for patient to confirm OK. ok to transfer to front if needed. Patient was on schedule on 09/13/2017 but VK will not be in clinic. -Delaney Meigsamara

## 2017-09-01 NOTE — Telephone Encounter (Signed)
Called pt and LVM that Dr. Gustavus BryantLiem has ordered the requested procedure and to call me back when he is ready to schedule at [phone].

## 2017-09-08 ENCOUNTER — Encounter (INDEPENDENT_AMBULATORY_CARE_PROVIDER_SITE_OTHER): Payer: Self-pay | Admitting: Podiatrist

## 2017-09-08 ENCOUNTER — Ambulatory Visit (INDEPENDENT_AMBULATORY_CARE_PROVIDER_SITE_OTHER): Payer: PPO | Admitting: Podiatrist

## 2017-09-08 DIAGNOSIS — M1712 Unilateral primary osteoarthritis, left knee: Secondary | ICD-10-CM | POA: Insufficient documentation

## 2017-09-08 DIAGNOSIS — S83232A Complex tear of medial meniscus, current injury, left knee, initial encounter: Secondary | ICD-10-CM | POA: Insufficient documentation

## 2017-09-08 DIAGNOSIS — M2021 Hallux rigidus, right foot: Secondary | ICD-10-CM

## 2017-09-08 NOTE — Progress Notes (Signed)
Ronnie Mason  PCP:  Ronnie Medal, MD     Visit type: Established patient extended visit in conjunction with his wife for further discussion regarding his painful right great toe joint where he has a loose body in some arthrosis.    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  Continued pain in the area.  Would like to have a cheilectomy.  When I last saw him he was only a few weeks postoperative from an meniscus surgery on the left side.  He had not even started physical therapy at.  He is wine a have surgery on his right great toe joint but his contralateral limb was not excepting weight while yet without pain and he had not been through any therapy.  I recommended he mentioned his right great toe joint to his orthopedic surgeon Dr. Marcelle Overlie.  He did so and Dr. Marcelle Overlie feels like he can move forward with his right great toe joint surgery from a standpoint of the left knee.  That said, the patient still relates some symptoms with his knee.  He is having trouble pushing off of it and going upstairs.  He doesn't feel like he's role functional with it and is progressing through physical therapy.    His right great toe joint does bother him more than the knee.  He tells me this is the issue with his right foot but also mentions numerous other areas of bother him including his bilateral lower extremities "everywhere" he says.  He relates that pain areas lateral ankle at times but not today.  He also relates some pain in his arch at times.  He says the great toe joints are worse.  He also relates is on his other lower extremity.      He does have a complex medical history with a long problem list including chronic pain, lumbar radiculopathy, peripheral neuropathy, traumatic brain injury, stage III renal insufficiency.      OBJECTIVE:   His right great toe joint has some limitation of motion and pain.  X-ray showed a loose body and some grade 2 arthrosis.  He's currently wearing an  Ace wrap on his left knee is not entirely comfortable walking on it yet in my ration.  He's right is some lateral ankle pain but his peroneal tendons are intact without subluxation and no effusion.  His peroneal tubercles not tender.  His ankle and subtalar joint range of motion appear normal and I can't reproduce any symptoms.  He does relate subjective discomfort throughout the "entire" foot and ankle at times and he says this is due to as "neuropathy".  I'll see any musculoskeletal abnormalities on the right foot other than his right great toe joint.    ASSESSMENT:  (M20.21) Hallux rigidus of right foot  (primary encounter diagnosis)    PLAN:  Still concerned with the timing of his request for intervention on the right great toe joint.  He's really not rehabilitation from the knee very well yet and not real functional.  Asked to be the sole weightbearing force for up to 4 weeks is a bit rough I suspect.  I'll see any harm with the right great toe joint or the knee in waiting a few more weeks until he gets further along with the left knee.  In talking with him he says that's reasonable.  He would like to wear a walking boot for a while.  We did dispense this and I recommended  even up device to give him his normal gait as possible as far as leg length.  It wouldn't surprise me if he has some difficulty with the weightbearing in the walking boot with some of these other complaints that he has is he complains of discomfort in the lower extremities and multiple areas.  2 bothersome that he should take it off and go back into his shoes.    I also spent a good deal of time today talking about expectations with respect to his surgery.  His great toe joint isn't that different than his knee and that he does have cartilage damage in the joint that is structural in nature.  Cleaning this up can help to slight cleaning up the knee can help.  It is possible with the arthritis in his great toe joint, and for that matter his  knee as well" that he could have further trouble in the future and in the case of the great toe joint he could need arthrodesis or other more involved procedures.  The cleanup procedure with cheilectomy and removal of the loose body with synovectomy and chondroplasty as needed may not be completely curative for the problem.  I suspect he should experience improvement but he could still have some persistent issues with the right great toe joint.  On top of that he subjectively relates complaints with multiple other areas of his foot and ankle that do not seem to have any musculoskeletal cause.  I did let he and his wife know that the surgery on the great toe joint will not predictably help any of those other areas.  Follow-up in 3-4 weeks and we'll see how is progressing with his knee rehabilitation and consider surgery on the right great toe joint sometime around then.    He has a complex medical history and with his age of [age] years old only the preoperative history and physical with his primary care physician Dr. Judi SaaVara Kraft.    30 minutes were spent face-to-face contact with the patient, greater than 50% of which was spent counseling and coordinating care.        Miguel RotaBrian D. Anthem Frazer, DPM  [facility]    Portions of this document were created with voice-recognition software and may contain occasional errant or misspelled words.

## 2017-09-13 ENCOUNTER — Ambulatory Visit (INDEPENDENT_AMBULATORY_CARE_PROVIDER_SITE_OTHER): Payer: PPO | Admitting: Internal Medicine

## 2017-09-15 ENCOUNTER — Ambulatory Visit: Payer: PPO | Attending: Physical Medicine & Rehabilitation | Admitting: Physical Medicine & Rehabilitation

## 2017-09-15 VITALS — BP 150/91 | HR 94 | Temp 97.7°F | Resp 12

## 2017-09-15 DIAGNOSIS — M5416 Radiculopathy, lumbar region: Secondary | ICD-10-CM | POA: Insufficient documentation

## 2017-09-15 MED ORDER — DEXAMETHASONE SODIUM PHOSPHATE 10 MG/ML IJ SOLN
20.0000 mg | Freq: Once | INTRAMUSCULAR | Status: AC
Start: 2017-09-15 — End: 2017-09-15
  Administered 2017-09-15: 20 mg via INTRALESIONAL

## 2017-09-15 NOTE — Patient Instructions (Addendum)
PATIENT EDUCATION for INJECTION PROCEDURES    Post-Procedure Discharge Instructions    After the procedure you might experience:  . Immediate and complete pain relief is rare; sometimes a mild increase in pain can occur for several days after the procedure  . Numbness and/or weakness in the area of your body supplied by the injected nerve; these symptoms should resolve but may last up to several hours  . Some soreness and bruising at the injection site(s)    Activities:  . If you have any weakness or numbness caused by the injection, DO NOT DRIVE or operate machinery and limit other activity until sensation returns to normal.  . Activity should be limited to basic levels (no exercise or other heavy activity) on the day of the procedure  . Physical therapy and higher level exercise should be delayed for three days following the procedure    Medications:   If you stopped taking any blood thinning medications such as Coumadin or Plavix, you may resume these tomorrow unless specified differently by the prescribing physician.    Site care:  . You may remove the band-aid after 6 hours.  . You may shower today. No swimming, tub baths or hot tubs for 24 hours following your procedure.  . For the first 48 hours, apply ice packs to the injection site for 15-20 minutes hourly as needed for comfort.  Wrap a light towel or cloth around ice packs and heating pad to protect the skin.  . After 48 hours, use a warm heating pad to the injection site for 15-20 minutes hourly as needed for comfort.    If you have diabetes and you received steroids today:   Check your blood sugar more frequently than usual as you may develop an increase in blood sugar for the next 10-14 days. Contact your diabetes physician if this occurs.    Call us if you develop any of the following symptoms in the next 7 days:  . Fever above 100 degrees F     . Any unusual increase in your level of pain  . Swelling, bleeding, redness, or increased tenderness at  the procedure or IV site  . Headache not relieved by Tylenol (if you had an epidural steroid injection)    Follow-up: As instructed.    Contact Us    [facility]:  [phone], option 8 to speak to the nurse    [facility]:  [phone], option 8    [facility]:   [phone]    Thank you for allowing us to participate in the management of your medical care.  Dr. Harrast, Dr. Krabak, Dr. Liem, Dr. Johnson and Dr. Sandhu                 POST-INJECTION EVALUATION    The following information needs to be completed and returned to our offices at your follow-up appointment.  You may be sore from the needles, so when rating your pain, please concentrate on your regular pain and not any soreness from the needle injection itself.          1-HOUR after the procedure, my pain level is (place an 'x' along the line below):                           No Pain                                                         Worst Pain Imaginable  0 1 2 3 4 5 6 7 8 9 10        2-HOURS after the procedure, my pain level is (place an 'x' along the line below):                 No Pain                                                                               Worst Pain Imaginable  0 1 2 3 4 5 6 7 8 9 10        3-HOURS after the procedure, my pain level is (place an 'x' along the line below):      No Pain                                                         Worst Pain Imaginable  0 1 2 3 4 5 6 7 8 9 10      4-HOURS after the procedure, my pain level is (place an 'x' along the line below):      No Pain              Worst Pain Imaginable  0 1 2 3 4 5 6 7 8 9 10      5-HOURS after the procedure, my pain level is (place an 'x' along the line below):       No Pain                                    Worst Pain Imaginable  0 1 2 3 4 5 6 7 8 9 10      24-HOURS after the procedure, my pain level is (place an 'x' along the line below):       No Pain               Worst Pain  Imaginable  0 1 2 3 4 5 6 7 8 9 10      2-DAYS after the procedure, my pain level is (place an 'x' along the line below):                            No Pain                                                                                                       Worst Pain Imaginable  0 1 2 3 4 5   6 7 8 9 10    3-DAYS after the procedure, my pain level is (place an 'x' along the line below):                No Pain                                                 Worst Pain Imaginable  0 1 2 3 4 5 6 7 8 9 10      1-WEEK after the procedure, my pain level is (place an 'x' along the line below):      No Pain                                                                        Worst Pain Imaginable  0 1 2 3 4 5 6 7 8 9 10      2-WEEKS after the procedure, my pain level is (place an 'x' along the line below):       No Pain                                               Worst Pain Imaginable  0 1 2 3 4 5 6 7 8 9 10            Thank you for your responses.    Please return this form to the Medical Assistant or your Physician.

## 2017-09-15 NOTE — Progress Notes (Signed)
ATTENDING PROCEDURE ADDENDUM / ATTESTATION  The above procedure (Bilateral L5 TFESI ) was performed by myself with the assistance of Tyson Babinskirevor Gessel, Sports Medicine Fellow Physician. I was present for the entire procedure and directly supervised the procedure. I personally reviewed all available imaging and clinic notes prior to the procedure.     Park LiterBrian Hernando Reali, MD  Attending Physician  [facility]  [facility].

## 2017-09-15 NOTE — Progress Notes (Signed)
Pain score prior to procedure:  7/10  Pain score after procedure:  4/10    Ronnie Mason met discharge criteria:  A/OX4/baseline, VSS/returned to baseline, ambulatory with steady gait. Site clean dry and intact.  Written and verbal discharge instructions, including emergency contact phone numbers, reviewed with Ronnie Mason. Verbal understanding obtained from him.  Ronnie Mason dressed self without assistance. He was discharged in stable condition, ambulatory with steady gait to home with all belongings and with ride/responsible person.

## 2017-09-15 NOTE — Progress Notes (Signed)
[name] identified by name and DOB.  He ambulated to procedure area with steady gait.  He confirmed he is having a Right L5 TF ESI  He denies taking any blood thinners.   He denies a history of fainting during medical procedures.   He has not fallen in the last 6 months.  He held his medications as directed on yellow form.  He has been NPO since   He undressed self without assistance.   I verbally reviewed written discharge instructions and pain diary with him.   Confirmed ride home with his Buel ReamLindy, wife, [phone]

## 2017-09-15 NOTE — Progress Notes (Signed)
[facility]    Specializing in sports-related injuries and non-surgical care for conditions of the spine,  shoulder, elbow, wrist, hand, hip, knee, foot, and ankle.      09/15/2017    Aleda Granaavid Paul Engh  [id number]      PROCEDURE:  Fluoroscopic guided bilateral L5 transforaminal epidural steroid injection    DIAGNOSIS/INDICATION: Bilateral L5 radicular pain.  MRI from 08/10/2016 demonstrated Mild multilevel degenerative changes of the lumbar spine, similar compared to prior exam on 01/04/2014. There are circumferential disc bulges at multiple levels with associated annular fissure at level L4-L5. He got significant relief from a similar injection preformed 05/19/2017. Since then he has had left knee surgery, following which he has started to have a lot more left leg, particularly with PT. Please see clinic notes for additional information (progress note from 06/03/2017).        Allergies: is allergic to bee venom; gabapentin; and lidocaine.  Allergies were reviewed with the patient. No contraindications were identified.      CONSENT:    Potential risks including pain, serious infection, paralysis, nerve injury, spinal headache, allergic reaction, and others were discussed with the patient. The patient was given and read a patient information sheet regarding injection procedures prior to todays injection. The patient understands the benefits, risks, and alternatives of the procedure and agrees to the procedure today.  Written informed consent was obtained.       PROCEDURE TECHNIQUE:    The patient was escorted to the procedure suite and positioned prone on the procedure table. A brief pause occurred prior to procedure in which final verification was performed. The skin overlying the area was prepped and draped in the usual sterile fashion using chlorhexidine.  Under fluoroscopic guidance the right L5 neuroforamen was located. Using a 25-gauge needle, the skin overlying the area was  anesthetized with 1% lidocaine. Using intermittent fluoroscopic guidance, a 22 gauge 5 inch spinal needle was advanced to the region of the target nerve root. Unfortunately, after multiple attempts we continually encountered os and were initially unable to place the needle in the right foraminal space. We elected to abort this side and turn our attention to the left side. Using a 25-gauge needle, the skin overlying the area was anesthetized with 1% lidocaine. Using intermittent fluoroscopic guidance, a 22 gauge 5 inch spinal needle was advanced to the region of the target nerve root with moderate difficulty but we were eventually able to get to place the needle in the target location.  A small amount of Omnipaque (iohexol) contrast was instilled through extension tubing to identify the nerve root sleeve and assure no vascular tracking using live fluoroscopy. This was followed by the injection of 1.0 cc 1% lidocaine and 1.0 cc of dexamethasone 10mg /ml. The needle was then flushed with 1% lidocaine and removed, and we turned our attention again to the right side. We again anesthetized the overlying skin with 1% lidocaine. Using intermittent fluoroscopic guidance, a 22 gauge 5 inch spinal needle was again advanced to the region of the target nerve root on the right. With this second attempt we took a more inferior approach, similar to our last procedure on 05/19/17, and while we again encountered os (the transverse process) we were able to maneuver around this and placed the needle in the target location. A small amount of Omnipaque (iohexol) contrast was instilled through extension tubing to identify the nerve root sleeve and assure no vascular tracking using live fluoroscopy. This was followed  by the injection of 1.0 cc 1% lidocaine and 1.0 cc of dexamethasone 10mg /ml. The needle was then flushed with 1% lidocaine and removed, the skin was cleansed, and a bandage placed over the injection site.  The patient was  assisted to the seated position and then escorted to the post procedure recovery area in good condition.  The patient tolerated the procedure well.  Vital signs remained stable both pre- and post- procedure.    Complications: As above, to place both sides, and in particular the right side, we required multiple attempts and encountered ox multiple times. In review of the imaging during the procedure and afterwards, it appears the needle placement during the 05/19/17 injection which resulted in good pain relief, was substantially lower in the neural foramen than what we were initially attempting today. In there future it would likely be easier to error on placing the needle lower in the foramen than higher as the transverse process seems to block a more superior approach.    Pre-injection VAS: 7/10  Post-injection VAS: 4/10    POST-INJECTION PLAN  1.  The patient was given instructions regarding the use of ice and appropriate activity level over the next few weeks.  Patient was instructed to call my office immediately if there are any questions or problems.    2.  The patient will follow up in clinic in 2-3 weeks.     This procedure was performed under the supervision of the attending physician, Dr. Wynetta Fines, MD  Fellow, Sports Medicine  Department of Rehabilitation Medicine

## 2017-09-16 ENCOUNTER — Telehealth (INDEPENDENT_AMBULATORY_CARE_PROVIDER_SITE_OTHER): Payer: Self-pay | Admitting: Internal Medicine

## 2017-09-16 NOTE — Telephone Encounter (Signed)
I did not need him to check a lipid as he did this last fall   ecare is sent to him     Results for orders placed or performed in visit on 01/11/17   COMPREHENSIVE METABOLIC PANEL   Result Value Ref Range    Sodium 138 135 - 145 meq/L    Potassium 4.8 3.6 - 5.2 meq/L    Chloride 103 98 - 108 meq/L    Carbon Dioxide, Total 27 22 - 32 meq/L    Anion Gap 8 4 - 12    Glucose 107 62 - 125 mg/dL    Urea Nitrogen 26 (H) 8 - 21 mg/dL    Creatinine 7.821.23 (H) 0.51 - 1.18 mg/dL    Protein (Total) 6.9 6.0 - 8.2 g/dL    Albumin 4.3 3.5 - 5.2 g/dL    Bilirubin (Total) 0.5 0.2 - 1.3 mg/dL    Calcium 9.3 8.9 - 95.610.2 mg/dL    AST (GOT) 27 9 - 38 U/L    Alkaline Phosphatase (Total) 91 37 - 159 U/L    ALT (GPT) 26 10 - 48 U/L    GFR, Calc, European American 59 (L) >59 mL/min/[1.73_m2]    GFR, Calc, African American >60 >59 mL/min/[1.73_m2]    GFR, Information       Calculated GFR in mL/min/1.73 m2 by MDRD equation.  Inaccurate with changing renal function.  See [email or web address]/test/bclim/cGFR.[email or web address]     Results for orders placed or performed in visit on 01/11/17   LIPID PANEL   Result Value Ref Range    Cholesterol (Total) 202 (H) <200 mg/dL    Triglyceride 213131 <086<150 mg/dL    Cholesterol (HDL) 578106 >39 mg/dL    Cholesterol (LDL) 70 <130 mg/dL    Non-HDL Cholesterol 96 0 - 159 mg/dL    Cholesterol/HDL Ratio 1.9     Lipid Panel, Additional Info. (NOTE)

## 2017-09-16 NOTE — Telephone Encounter (Signed)
Patient came in a little upset that he fasted and went to the lab to complete labs for tomorrow's annual exam. He stated at his last visit with Dr. Marisa SprinklesKraft she told him to complete labs the day before his appointment, but no labs were ever ordered.     He will come in for his appointment tomorrow, not fasted.     He left as he has an appointment elsewhere at 10.

## 2017-09-17 ENCOUNTER — Ambulatory Visit (INDEPENDENT_AMBULATORY_CARE_PROVIDER_SITE_OTHER): Payer: PPO | Admitting: Internal Medicine

## 2017-09-17 ENCOUNTER — Encounter (INDEPENDENT_AMBULATORY_CARE_PROVIDER_SITE_OTHER): Payer: Self-pay | Admitting: Internal Medicine

## 2017-09-17 VITALS — BP 124/80 | HR 90 | Temp 97.8°F | Resp 16 | Ht <= 58 in | Wt 232.0 lb

## 2017-09-17 DIAGNOSIS — E785 Hyperlipidemia, unspecified: Secondary | ICD-10-CM

## 2017-09-17 DIAGNOSIS — R739 Hyperglycemia, unspecified: Secondary | ICD-10-CM

## 2017-09-17 DIAGNOSIS — E559 Vitamin D deficiency, unspecified: Secondary | ICD-10-CM

## 2017-09-17 DIAGNOSIS — I10 Essential (primary) hypertension: Secondary | ICD-10-CM

## 2017-09-17 DIAGNOSIS — M25562 Pain in left knee: Secondary | ICD-10-CM

## 2017-09-17 DIAGNOSIS — Z23 Encounter for immunization: Secondary | ICD-10-CM

## 2017-09-17 DIAGNOSIS — M79674 Pain in right toe(s): Secondary | ICD-10-CM

## 2017-09-17 DIAGNOSIS — Z6841 Body Mass Index (BMI) 40.0 and over, adult: Secondary | ICD-10-CM

## 2017-09-17 DIAGNOSIS — M62838 Other muscle spasm: Secondary | ICD-10-CM

## 2017-09-17 DIAGNOSIS — Z0000 Encounter for general adult medical examination without abnormal findings: Secondary | ICD-10-CM

## 2017-09-17 DIAGNOSIS — H6061 Unspecified chronic otitis externa, right ear: Secondary | ICD-10-CM

## 2017-09-17 DIAGNOSIS — E538 Deficiency of other specified B group vitamins: Secondary | ICD-10-CM

## 2017-09-17 MED ORDER — METHOCARBAMOL 500 MG OR TABS
ORAL_TABLET | ORAL | 2 refills | Status: DC
Start: 2017-09-17 — End: 2017-10-12

## 2017-09-17 MED ORDER — TRIAMCINOLONE ACETONIDE 0.1 % EX CREA
TOPICAL_CREAM | CUTANEOUS | 0 refills | Status: DC
Start: 2017-09-17 — End: 2017-12-17

## 2017-09-17 NOTE — Patient Instructions (Signed)
Call insurance for nutrition clinic coverage

## 2017-09-17 NOTE — Progress Notes (Signed)
Ronnie GranaDavid Paul Mason is a 67 year old male       BP 124/80    Pulse 90    Temp 97.8 F (36.6 C) (Oral)    Resp 16    Ht 4' 6.02" (1.372 m)    Wt (!) 232 lb (105.2 kg)    SpO2 98%    BMI 55.90 kg/m   Chief Complaint   Patient presents with    Annual Exam     Physical. Pt would like to discuss recent knee surgery.    Nutrition Counseling     pt would like referral to a Nutrionalist.    Leg Pain     discuss leg npain in both legs.         IMPRESSION / PLAN / DISCUSSION   (Z00.00) Preventative health care  (primary encounter diagnosis)  (M25.562) Acute pain of left knee  (M79.674) Pain of toe of right foot  (Z23) Need for vaccination with 13-polyvalent pneumococcal conjugate vaccine  (I10) Essential hypertension  (R73.9) Hyperglycaemia  (E78.5) Hyperlipidemia, unspecified hyperlipidemia type  (E55.9) Vitamin D deficiency  (E53.8) B12 deficiency  (H60.61) Chronic otitis externa of right ear, unspecified type  (F62.130(M62.838) Muscle spasm    Ronnie Mason was seen today for annual exam, nutrition counseling and leg pain.    Diagnoses and all orders for this visit:    Preventative health care  Comments:  overall doing well   no longer drinking      Acute pain of left knee  Comments:  had recent left knee meniscus Re do surgery w Dr Marcelle OverlieHolland as injured post lifing dog  Orders:  -     DISABLED PARKING PERMIT AUTHORIZATION    Pain of toe of right foot  Comments:  coming up toe surgery   henec need dmv when wife or other drives   Orders:  -     DISABLED PARKING PERMIT AUTHORIZATION    Need for vaccination with 13-polyvalent pneumococcal conjugate vaccine  -     pneumococcal (Prevnar 13) conjugate vacc (adult) 0.5 mL IM - [id number]    Essential hypertension  Comments:  controlled and no changes needed     Hyperglycaemia  Comments:  aic previously ok  past inc glucose  wt gain   he will be referred to dm educator if bs up  he said from [facility] can call insurance for nutrition referral   Orders:  -     Hemoglobin A1c; Future  -      Comprehensive Metabolic Panel; Future    Hyperlipidemia, unspecified hyperlipidemia type  Comments:  labs ok last fall  ok to re check as wt gain   Orders:  -     Lipid Panel; Future    Vitamin D deficiency  Comments:  just keep taking , recent lab ok     B12 deficiency  Comments:  keep taking     Chronic otitis externa of right ear, unspecified type  Comments:  use hydrocortisone  then triamcinolone  clobetasol ok if failed other but use spairing ly   Orders:  -     Triamcinolone Acetonide 0.1 % External Cream; As needed to right ear bid x 1 week    Muscle spasm  Comments:  he wanted more med  already on occ muscle relaxant at night  norcotic not ok  tylenol can take 1 qid   Orders:  -     Methocarbamol 500 MG Oral Tab; 1/2 to one at night for leg spasm  Health Maintainace reviewed with patient today that are due  Health Maintenance   Topic Date Due    Zoster Vaccine (2 of 3)      shingrix  Sometime next year defer ok as not available anyway    07/17/2013    Pneumococcal Vaccine (1 of 2 - PCV13) 07/07/2017  prevnar 13 now   Pneumovax 23 next year     Lipid Disorders Screening  02/11/2018    Depression Screening (PHQ-2)  05/21/2018    Diabetes Screening  02/12/2020    Colorectal Cancer Screening (Colonoscopy)  09/24/2020    Tetanus Vaccine  11/29/2025    Influenza Vaccine  Completed    Hepatitis C Screening  Addressed    HIV Screening  Addressed     Decline psa     Return in about 3 months (around 12/15/2017).    Patient   express understanding of care plan/medications    Risk of taking medication properly and follow up discussed especially to call if problem  MEDICATION as of Discharge  Current Outpatient Medications   Medication Sig Dispense Refill    Acetaminophen 500 MG Oral Tab 2 tablets bid      AmLODIPine Besylate 5 MG Oral Tab Take 1 tablet (5 mg) by mouth 2 times a day. 90 tablet 1    Atorvastatin Calcium 10 MG Oral Tab Take 1 tablet (10 mg) by mouth daily. 90 tablet 2    Cholecalciferol  (VITAMIN D3) 2000 units Oral Cap Take 1 capsule (2,000 Units) by mouth daily. For low level 1 capsule 1    Clobetasol Propionate 0.05 % External Ointment Small amount to rash as needed bid for 1-2 weeks at a time 15 g 1    Cyanocobalamin 1000 MCG Oral Tab one per day over-the-counter started 5/21 /2012 this level borderline low 1 Tab 1    DICLOFENAC OR Take 75 mg by mouth.      Diclofenac Sodium (VOLTAREN) 1 % Transdermal Gel Apply 2 g topically 4 times a day. Apply to neck, trapezius muscle, and low back. No more than 2g applied at a time. 1 Tube 5    DULoxetine HCl 30 MG Oral CAPSULE ENTERIC COATED PARTICLES Take 1 capsule (30 mg) by mouth daily. 90 capsule 1    DULoxetine HCl 60 MG Oral CAPSULE ENTERIC COATED PARTICLES Take 1 capsule (60 mg) by mouth daily. 90 capsule 1    EPINEPHrine 0.3 MG/0.3ML Injection Solution Auto-injector Inject as instructed per patient package insert, 0.3 mg intramuscularly or subcutaneously into the thigh, if needed to treat anaphylaxis 3 each 1    Lidocaine 5 % External Patch Apply 1 patch onto the skin daily. Apply to painful area for up to 12 hours in a 24 hour period. 30 patch 10    Lisinopril 20 MG Oral Tab Take 1 tablet (20 mg) by mouth every 12 hours. 180 tablet 3    Methocarbamol 500 MG Oral Tab 1/2 to one at night for leg spasm 30 tablet 2    Naproxen Sodium 220 MG Oral Cap Take 2 capsules (440 mg) by mouth every 12 hours as needed. (Patient not taking: Reported on 09/17/2017) 1 capsule 1    Pregabalin 100 MG Oral Cap Take 1 capsule (100 mg) by mouth 2 times a day. 180 capsule 1    Pregabalin 25 MG Oral Cap 1  po bid 05/21/2017 180 capsule 1    Primidone 50 MG Oral Tab Take 1 tablet (50 mg) by mouth 2 times  a day. 180 tablet 0    THIAMINE HCL OR       Triamcinolone Acetonide 0.1 % External Cream As needed to right ear bid x 1 week 1 Tube 0     No current facility-administered medications for this visit.          ++++++++++++++++++++++++++++++++++++++++++++++++++++++++++++++  Start of visit   Ronnie Mason is a 67 year old male                   BP 124/80    Pulse 90    Temp 97.8 F (36.6 C) (Oral)    Resp 16    Ht 4' 6.02" (1.372 m)    Wt (!) 232 lb (105.2 kg)    SpO2 98%    BMI 55.90 kg/m   Chief Complaint   Patient presents with    Annual Exam     Physical. Pt would like to discuss recent knee surgery.    Nutrition Counseling     pt would like referral to a Nutrionalist.    Leg Pain     discuss leg npain in both legs.     HPI  History of:    pcp [name], MD    Had L5 epidural yesterday   Dr [name]     Today follow-up above issues:     Left knee - 07/2017 - Dr Erlinda Hong innes    Right big toe , Dr Alwyn Ren x 4 weeks in boot    Pending new grand baby    Want see nutritionist as pounds come on fast as has not been able to walk   Pending sleep apnea test w dentist per patient    Not driving   Alcohol occ glass wine- no hard liquor for while    Decline prostate check     He does not want hep b vac     Office Visit on 03/29/17   1. HEPATITIS B Battery (HBsAg w/rflx PCR, anti-HBs, anit-HBc)   Result Value Ref Range    Hepatitis B S Ag w/Rflx PCR Nonreactive NREAC    Hepatitis B Surface Ab Nonreactive     Hepatitis B Surf Antibody Intl Units <8.00 m[IU]/mL    Hepatitis B Core Ab Nonreactive NREAC    Hepatitis B Battery Interp       No evidence of past or present Hepatitis B infection.   2. Hepatitis C Ab with REFLEX PCR   Result Value Ref Range    Hepatitis C Antibody w/Rflx PCR Nonreactive NREAC    Hepatitis C Antibody Interpretation       No evidence of antibody to hepatitis C virus (HCV).  Anti-HCV may develop slowly (6 weeks - 6 months) over the course of hepatitis C virus infection.  Rarely it may be absent in cases of chronic   infection.  Anti-HCV may also disappear after recovery from acute, self-limited disease.     3. ANA REFLEX COMPREHENSIVE PANEL   Result Value Ref Range    Antibodies to  Nuclear Ags by IF (ANA) Negative NRN    ANA Pattern by IF None     Ana Screen By Multiplex Negative NRN    Ana Interpretation Comment 1 Screen negative by IFA and multiplex.    4. CRYOGLOBULINS   Result Value Ref Range    Cryoglobulin, Serum At 3 Day Negative NRN %    Cryoglobulin, Serum At 10 Day Negative NRN %    Cryoglobulins Method  This test was developed and its performance characteristics determined by the [facility] Department of Laboratory Medicine in a manner consistent with CLIA requirements. This test has   not been cleared or approved by the U.S. FDA.         MEDICATION PRIOR TO VISIT  Reports   Outpatient Medications Prior to Visit   Medication Sig Dispense Refill    Acetaminophen 500 MG Oral Tab 2 tablets bid      AmLODIPine Besylate 5 MG Oral Tab Take 1 tablet (5 mg) by mouth 2 times a day. 90 tablet 1    Atorvastatin Calcium 10 MG Oral Tab Take 1 tablet (10 mg) by mouth daily. 90 tablet 2    Cholecalciferol (VITAMIN D3) 2000 units Oral Cap Take 1 capsule (2,000 Units) by mouth daily. For low level 1 capsule 1    Clobetasol Propionate 0.05 % External Ointment Small amount to rash as needed bid for 1-2 weeks at a time 15 g 1    Cyanocobalamin 1000 MCG Oral Tab one per day over-the-counter started 5/21 /2012 this level borderline low 1 Tab 1    DICLOFENAC OR Take 75 mg by mouth.      Diclofenac Sodium (VOLTAREN) 1 % Transdermal Gel Apply 2 g topically 4 times a day. Apply to neck, trapezius muscle, and low back. No more than 2g applied at a time. 1 Tube 5    DULoxetine HCl 30 MG Oral CAPSULE ENTERIC COATED PARTICLES Take 1 capsule (30 mg) by mouth daily. 90 capsule 1    DULoxetine HCl 60 MG Oral CAPSULE ENTERIC COATED PARTICLES Take 1 capsule (60 mg) by mouth daily. 90 capsule 1    EPINEPHrine 0.3 MG/0.3ML Injection Solution Auto-injector Inject as instructed per patient package insert, 0.3 mg intramuscularly or subcutaneously into the thigh, if needed to treat anaphylaxis  3 each 1    Lidocaine 5 % External Patch Apply 1 patch onto the skin daily. Apply to painful area for up to 12 hours in a 24 hour period. 30 patch 10    Lisinopril 20 MG Oral Tab Take 1 tablet (20 mg) by mouth every 12 hours. 180 tablet 3    Methocarbamol 500 MG Oral Tab 1/2 to one at night for leg spasm (Patient not taking: Reported on 09/17/2017) 30 tablet 2    Naproxen Sodium 220 MG Oral Cap Take 2 capsules (440 mg) by mouth every 12 hours as needed. (Patient not taking: Reported on 09/17/2017) 1 capsule 1    Pregabalin 100 MG Oral Cap Take 1 capsule (100 mg) by mouth 2 times a day. 180 capsule 1    Pregabalin 25 MG Oral Cap 1  po bid 05/21/2017 180 capsule 1    Primidone 50 MG Oral Tab Take 1 tablet (50 mg) by mouth 2 times a day. 180 tablet 0    THIAMINE HCL OR       Triamcinolone Acetonide 0.1 % External Cream As needed to right ear bid x 1 week (Patient not taking: Reported on 09/17/2017) 1 Tube 0     No facility-administered medications prior to visit.        REVIEW OF SYSTEMS - are otherwise negative unless as noted in HPI above or as additional issues below  Constitutional:     Eyes:    Head ears, nose mouth throat:   Cardiovascular:   Respiratory:   Gastrointestinal:   Genitourinary:    Musculoskeletal:  Legs working w Dr Margurite Auerbach   Integumentary:  Neurological:   Psychiatric/Behavioral:     Endocrine:   Hematological:   Allergic/Immunologic:     PHYSICAL EXAM  BP 124/80    Pulse 90    Temp 97.8 F (36.6 C) (Oral)    Resp 16    Ht 4' 6.02" (1.372 m)    Wt (!) 232 lb (105.2 kg)    SpO2 98%    BMI 55.90 kg/m   Well Dressed     Constitution, In general looks:   well , good coloring  No diaphoresis  Patient is Alert  & Attentive  Patient is conversive    Psychiatric Mental Status / Neurologic Mental Status:  Affect:  Pleasant calm  Mood:  Good  Awake: Alert and attentive    Not confused  Thought::     content : no gross issue   Process : logical  Language:   Speech is fluent  Intact  comprehension    Eyes: no discharge, no gross puffiness, non- icteric    Neck:    Thyroid not enlarged  2+ carotid pulses no bruits  Lymph nodes:  No gross cervical swelling  No gross axillary swelling    Cardiovascular:   Heart Regular rate , rhythm, no murmur  No edema  Respiratory:   Not tachyphpnic  Clear to ascultation  No rales, rhonchi , without increased effort or stridor  No clubbing     Abdomen / Gastrointestinal:  Soft. No masses. No guarding. No rebound  No gross bulge or hernia. Abdomen, groin  No bruit or pulsatile mass  Genitourinary:  No suprapubic tenderness  No increased flank pain with percussion    Neurologic:   Grossly intact  Memory:   Attention       Cranial Nerve    Eyes pupil and lid are intact   II, III, IV, VIII   Facial Movement VII: grossly intact, no facial droop     Motor- Gait-coordination: get up and go normal, no gross weakness   Gait- base normal appropriate for age   Get up and go -no problem        Musculoskeletal:  No gross abnormal movement, twitch   No gross atrophy or deformity    Skin:  No gross lesion or rash  Old pale mole back     Hematologic/Lymphatic /Immunologic:  No gross bruising or rash or hives     ++++++++++++++++++++++++++++++++++++++++++++++++++  Allergy  Review of patient's allergies indicates:  Allergies   Allergen Reactions    Adhesives Rash    Bee Venom Hives, Itching and Swelling    Gabapentin      Flu like symptoms, diarrhea, body ache upset stomach       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]     URI (upper respiratory infection) [J06.9]     Right lumbar radiculitis [M54.16] 05/05/2017    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] 01/11/2017     Osteoarthritis of right hip x ray report - moderate to advanced radiology read  01/11/2017      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] 08/06/2016    Impaired mobility and ADLs [Z74.09] 04/08/2016    Difficulty in walking, not elsewhere classified [R26.2] 03/12/2016    Vitamin D deficiency [E55.9] 02/13/2016    Balance problem [R26.89] 01/23/2016  Chronic pain of both shoulders [M25.511, G89.29, M25.512] 01/23/2016     X ray arthritis  02/19/2016        Pain of right hip joint [M25.551] 01/23/2016    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) [G91.2] 01/15/2016    Intraparenchymal hemorrhage of brain (HCC) [I61.9] 01/06/2016    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, 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, 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] 01/06/2016    Essential hypertension [I10] 12/16/2015    Essential tremor [G25.0] 12/16/2015    Hx of spontan intraparenchymal intracran bleed assoc with hypertension [Z86.79] 12/16/2015     [place] 4/29- 12/10/2015  Non-traumatic intraparenchymal hemorrhage, hypertensive of the left thalamus   Traumatic hemorrhage   -- falcine subdural hemorrhage, small cortical subarachnoid      Alcohol abuse [F10.10] 12/16/2015    Cerebral ventriculomegaly [G93.89] 10/09/2015    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    History of alcohol dependence (HCC) [F10.21] 06/26/2015    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) [G37.9] 05/07/2015    Idiopathic peripheral neuropathy [G60.9] 03/15/2015    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] 05/28/2014    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] 01/18/2014    Tremor [R25.1] 04/20/2013    Fall [W19.XXXA] 02/23/2013     BP < 100  Occasioanal tri[p and leg weakness      Back pain, lumbosacral [M54.5] 02/15/2013    Lumbar radiculopathy, chronic [M54.16] 02/15/2013    Neck pain [M54.2] 02/15/2013    Chronic pain [G89.29] 12/21/2012     Now at [facility], DO, Santiago Glad    Neck pain -burn c3-7 on left side per patient  Some trigger injection    Lumbar pain-since 80's better with  exercise      Bee sting allergy [Z91.030] 12/21/2012     hives      Numbness and tingling of leg [R20.0, R20.2] 12/21/2012     Left calf -left foot long term suspect from back-suspect L 45      Chronic back pain [M54.9, G89.29] 12/21/2012     Mri in mid scape 1/214  Multilevel degenerative disc disease of the lumbar spine. Circumferential disc bulge at all levels below and including L1-2. No significant central spinal canal stenosis or neuroforaminal narrowing.  2. Mild retrolisthesis of L5 on S1.            B12 deficiency [E53.8] 12/21/2012     Borderline low 5 /21/2014      Chronic renal insufficiency, stage III (moderate) (HCC) [N18.3] 12/21/2012     5/ 2013      Hyperlipidemia [E78.5] 09/26/2012    Cervicalgia [M54.2] 03/11/2010     Radiofrequency ablation of c3 to c 7           Past Medical History:   Diagnosis Date    Carotid Sinus Hypersensitivity     Disc disorder of lumbosacral region     HYPERTENSION      HYPOTENSION      Intraparenchymal hemorrhage of brain (HCC) 2017    Pain of right hip joint 01/23/2016    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 myelopathy or radiculopathy 05/28/2014    Syncope     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(HCC)     URI (upper respiratory infection)        Social History     Socioeconomic History    Marital status: Married     Spouse name: Not on file    Number of children: Not on file    Years of education: Not on file  Highest education level: Not on file   Social Needs    Financial resource strain: Not on file    Food insecurity - worry: Not on file    Food insecurity - inability: Not on file    Transportation needs - medical: Not on file    Transportation needs - non-medical: Not on file   Occupational History    Not on file   Tobacco Use    Smoking status: Never Smoker    Smokeless tobacco: Former Neurosurgeon   Substance and Sexual Activity    Alcohol use: Yes    Drug use: No    Sexual activity: Not on file   Other Topics Concern    Not on file   Social  History Narrative    ** Merged History Encounter **              Family History     Problem (# of Occurrences) Relation (Name,Age of Onset)    Colon Cancer (1) Father    Heart (other) (1) Mother: MI    Other Family Hx (1) Other: myelodysplasia          Social History     Socioeconomic History    Marital status: Married     Spouse name: Not on file    Number of children: Not on file    Years of education: Not on file    Highest education level: Not on file   Social Needs    Financial resource strain: Not on file    Food insecurity - worry: Not on file    Food insecurity - inability: Not on file    Transportation needs - medical: Not on file    Transportation needs - non-medical: Not on file   Occupational History    Not on file   Tobacco Use    Smoking status: Never Smoker    Smokeless tobacco: Former Estate agent and Sexual Activity    Alcohol use: Yes    Drug use: No    Sexual activity: Not on file   Other Topics Concern    Not on file   Social History Narrative    ** Merged History Encounter **

## 2017-09-30 ENCOUNTER — Ambulatory Visit: Payer: PPO | Attending: Physical Medicine & Rehabilitation | Admitting: Physical Medicine & Rehabilitation

## 2017-09-30 ENCOUNTER — Encounter (HOSPITAL_BASED_OUTPATIENT_CLINIC_OR_DEPARTMENT_OTHER): Payer: Self-pay | Admitting: Physical Medicine & Rehabilitation

## 2017-09-30 VITALS — BP 119/80 | HR 88 | Temp 97.5°F | Resp 16 | Ht 76.0 in | Wt 232.0 lb

## 2017-09-30 DIAGNOSIS — M5416 Radiculopathy, lumbar region: Secondary | ICD-10-CM | POA: Insufficient documentation

## 2017-09-30 DIAGNOSIS — Z6828 Body mass index (BMI) 28.0-28.9, adult: Secondary | ICD-10-CM

## 2017-09-30 NOTE — Progress Notes (Signed)
CC: Follow up after bilateral L5 transforaminal epidural steroid injections    HPI:  Ronnie Mason is a 67 year old man we saw on 09-15-17 for bilateral L5 transforaminal epidural steroid injections.  Recall, we had seen him previously on 06-03-17 in follow-up after a right L5 transforaminal epidural steroid injection he noted 100% relief of his symptoms.  However a few months after that began noting a recurrence and right leg radiating pain.  We had felt that his right leg symptoms were possibly due to a right L5 radiculitis possibly due to chemical irritation due to degenerative disc disease at L5-S1.  Returns clinic today stating that he had no side effects from the injection and is 50-75% better in regards to his right leg symptoms.  He also attributes some of his improvement to recently starting methocarbamol 500 mg.  This is prescribed by his primary care physician Dr. Marisa SprinklesKraft.  He notes that he takes 500 mg in the evening and 500 mg in the morning for total daily dose of 1000 mg.     It ishe also saw Dr. Joice Loftsistad of neurology, who after reviewing an EMG/nerve conduction study consistent with length dependent peripheral polyneuropathy, felt that his right leg symptoms were not due to a neuromuscular disorder but would rather do to orthopedic spine condition.     He is also followed by Dr. Renard MatterMcInnis for RIGHT hallux rigidis and [facility] oste[facility] and has been on Diclofenac 75 mg twice a day    Currently doing physical therapy with [facility] at [facility].     Past medical, past surgical, social, family hx: Reviewed with patient and is:     Medications:  Reviewed  as above    ROS:  A complete review of systems is negative except for what is documented in the HPI above and what is noted here:  No changes since last visit.     Physical Exam  Gen: In no acute distress.  Psych: Normal mood.  HEENT: EOMI. Normal cephalic, atruamatic  CVS: No clubbing, cyanosis, edema  Pulm: Non-labored  respirations  Skin: Moist, no lesions  Neuro: Gait is non-ataxic. Strength 5/5 bilateral hip flexion, knee section, ankle dorsiflexion and plantar flexion  MSK: No pain with lumbar flexion low back or leg.  Lumbar extension is worse for the patient.      Imaging/Diagnostic studies:  Reviewed with the patient and his wife  MRI lumbar spine showing multilevel degenerative disc disease.  Mild foraminal narrowing bilaterally      Procedures  Bilateral L5 TFESI 09-15-17 with 50-75% relief  RIGHT L5 TFESI 05-19-17 with 100% relief of symptoms for about 3 months    Impression:  Ronnie Mason is a 67 year old man with clinical symptoms of right lumbar radiculitis and L5 distribution, likely due to chemical irritation of the L5 nerve root from degenerative disc disease    Discussion/Plan:   At this point, it sounds as though they've has been getting about 3-4 months of relief from the epidural steroid injections that we have performed.  I spent some time discussing that we would limit these injections to no more than 3 in a 12 month period due to concern for cumulative doses of steroid.  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  He has a few other musculoskeletal conditions including left knee osteoarthritis and right  hallux rigidus that is continuing to likely altered biomechanics and loading.      - I am okay with repeating bilateral transforaminal epidural steroid injections in the future but hold for at least a 4-6 month period between this last injection and another injection.  He will call me should his symptoms return and we can set him up directly for bilateral L5 TFESIs.   - He is continuing with physical therapy with Drue Second at [facility]  - He notes that exercises given to him by Dr. Marcelle Overlie aggravate his back and that when he is doing cycling for his left knee  range of motion is worse since his back pain.  I suggested that he try deep water running which helps with the buoyancy to take pressure off the back but allows him to do range of motion for the knee  - He will continue to take medications as prescribed by his primary care physician including methocarbamol and also diclofenac from Dr. Marcelle Overlie.  I spent some time explaining what both of these medications do from a [place] extent point.  He is also on duloxetine and Lyrica.  I encouraged him to discuss with Dr. Marisa Sprinkles his current usage of methocarbamol so that she can update the prescription to reflect what is actually taking, which is 500 mg twice a day        Total time of 25 minutes was spent facet-to-face with the patient, of which, more than 50% was spent counseling and coordinating the patient's care as outlined above      Park Liter, MD  Attending Physician  [facility]  [facility] at [facility]

## 2017-10-01 ENCOUNTER — Ambulatory Visit (INDEPENDENT_AMBULATORY_CARE_PROVIDER_SITE_OTHER): Payer: PPO | Admitting: Podiatrist

## 2017-10-01 ENCOUNTER — Encounter (INDEPENDENT_AMBULATORY_CARE_PROVIDER_SITE_OTHER): Payer: Self-pay | Admitting: Podiatrist

## 2017-10-01 DIAGNOSIS — M2021 Hallux rigidus, right foot: Secondary | ICD-10-CM

## 2017-10-01 NOTE — Progress Notes (Signed)
Ronnie Mason  PCP:  Shann MedalKraft, Vara Viseskul, MD     Visit type: Established patient extended visit for further discussion regarding right great toe joint surgery with cheilectomy for treatment of mild arthrosis and loose body.    SUBJECTIVE:  Ronnie Mason is had trouble with his right great toe joint for a while.  A while back he was having trouble with his left knee and had a meniscus surgery with Dr. Marcelle OverlieHolland.  When I last saw him and he was requesting surgery on his right great toe joint he was still in the first few weeks postoperative from his knee surgery and had not started physical therapy up.  He was still favoring the knee and wearing a compression wrap.  This was a few weeks ago is now doing much better.  He feels like he is walking more normal on the knee.  He feels ready to have his right foot operated on.      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    OBJECTIVE:   He has reduced range of motion of the great toe joint has a loose body within the joint.  Very mild joint space narrowing on x-ray, not too severe.  He does have a joint effusion.  No sesamoid tenderness.    ASSESSMENT:  (M20.21) Hallux rigidus, right foot  (primary encounter diagnosis)    PLAN:      1.)  First metatarsal phalangeal joint cleanup procedure with cheilectomy, synovectomy and chondroplasty.  Removing the loose body should make a big difference.  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  The surgery is not necessarily a curative procedure given the nature of the problem.  3.)  He'll need to weeks nonweightbearing followed by 2-4 weeks in a walking boot and then gradual progression to shoes and activities thereafter.  2-3 months  before his walking functioning pre-well in shoes and he may have improvements all the way up to one year postoperative.  4.)  Is possible with the nature of his problem that further surgery including possible joint arthrodesis could be needed if he has worsening condition of the joint over time.  5.)  He has multiple issues with neuropathy and radiculopathy and has some foot pain and leg pain related to this.  As I talked with him about it past visits this surgery won't address those problems and won't fix any pain in other areas of his foot, only his great toe joint.  6.)  His orthopedic surgeon Dr. Marcelle OverlieHolland who did his left knee surgery has agreed that he's ready to have surgery on his right great toe joint from a standpoint of his left knee.  He was in quite there yet in my opinion when I saw him last visit but he seems to be now.    I reviewed the nature of the problem with her and his wife.  We talked about the procedure where the incision would be and all the aftercare.  I answered their questions.    He'll have his history and physical with Dr. Marisa SprinklesKraft as he is 67 years old and we'll have a combination anesthesia with some IV and inhalational general combined with some  local anesthetic in the foot.    25 minutes were spent face-to-face contact with the patient, greater than 50% of which was spent counseling and coordinating care.        Miguel Rota, DPM  [facility]    Portions of this document were created with voice-recognition software and may contain occasional errant or misspelled words.

## 2017-10-04 ENCOUNTER — Encounter (INDEPENDENT_AMBULATORY_CARE_PROVIDER_SITE_OTHER): Payer: Self-pay | Admitting: Internal Medicine

## 2017-10-07 ENCOUNTER — Telehealth (INDEPENDENT_AMBULATORY_CARE_PROVIDER_SITE_OTHER): Payer: Self-pay | Admitting: Podiatrist

## 2017-10-07 NOTE — Telephone Encounter (Signed)
LVM for patient to call back tomorrow when I am back in clinic.

## 2017-10-07 NOTE — Telephone Encounter (Signed)
General Message:    Detailed Message: Patients wife called and has some question regarding paper work/forms. Please call wife back.   Return Call: General message okay

## 2017-10-08 ENCOUNTER — Telehealth (INDEPENDENT_AMBULATORY_CARE_PROVIDER_SITE_OTHER): Payer: Self-pay | Admitting: Podiatrist

## 2017-10-08 ENCOUNTER — Telehealth (INDEPENDENT_AMBULATORY_CARE_PROVIDER_SITE_OTHER): Payer: Self-pay | Admitting: Orthopaedic Surgery

## 2017-10-08 NOTE — Telephone Encounter (Signed)
Closing encounter, see TE for 10/07/17 "Surgery".

## 2017-10-08 NOTE — Telephone Encounter (Signed)
General Message:    Detailed Message: Patients wife called, she has supplied her work # at [employer] for a call back in regards to scheduling her husbands surgery. Please call.   Return Call: No message

## 2017-10-08 NOTE — Telephone Encounter (Signed)
Called Ronnie Mason back, she stepped away from her desk, will tray to call back this afternoon.

## 2017-10-08 NOTE — Telephone Encounter (Signed)
Spoke to patient's wife, discussed surgery dates and they would like to do March 21st.  I advised that he will need to get PCP clearance no later than 3/18. Patient also seeing cardiology on 3/14, would be good to contact PCP to see if cardiac clearance is recommended.  She will contact Dr. Joette CatchingKraft's office and get back to me.

## 2017-10-08 NOTE — Telephone Encounter (Signed)
Physician performing procedure:  Dr. Donn PieriniBrian McInnes    Surgery Scheduling Logistics:   Procedure: RIGHT Foot-Cheilectomy  Date: 10-21-17  [facility]  Case status: Outpt  Diagnosis/ICD10: M20.21  Primary CPT: 28289  Additional CPT(s) including possible procedures: N/A    Insurance:  Insurance Company: [employer] [phone]   Group Number: [id number]    Subscriber ID: [id number]  Insurance Rep: N/A    Pre Authorization Needed: NO  PA Number: N/A  Authorization Status:  N/A  Reference #: N/A  Auth Received Date/Time: N/A  Auth Received Via: N/A  If auth received via website insert Screenshot here:   Fax, or Letter scanned to media: N/A /Date    Referral from PCP Needed: NO   Second Opinion Needed: NO

## 2017-10-11 NOTE — Telephone Encounter (Signed)
Patient called back, appointment scheduled for 3/18.

## 2017-10-11 NOTE — Telephone Encounter (Signed)
LVM for patient to call back to schedule surgery consult appointment for 3-18 or 3-19.

## 2017-10-11 NOTE — Telephone Encounter (Signed)
Patient called and he is able to see his PCP tomorrow for surgical clearance.  Reminded him to take forms I gave him to the visit.  He stated understanding.

## 2017-10-12 ENCOUNTER — Ambulatory Visit (INDEPENDENT_AMBULATORY_CARE_PROVIDER_SITE_OTHER): Payer: PPO | Admitting: Internal Medicine

## 2017-10-12 ENCOUNTER — Ambulatory Visit: Payer: PPO | Attending: Internal Medicine

## 2017-10-12 ENCOUNTER — Other Ambulatory Visit (INDEPENDENT_AMBULATORY_CARE_PROVIDER_SITE_OTHER): Payer: Self-pay | Admitting: Internal Medicine

## 2017-10-12 VITALS — BP 136/74 | HR 92 | Temp 97.4°F | Ht 75.98 in | Wt 224.0 lb

## 2017-10-12 DIAGNOSIS — G609 Hereditary and idiopathic neuropathy, unspecified: Secondary | ICD-10-CM

## 2017-10-12 DIAGNOSIS — E785 Hyperlipidemia, unspecified: Secondary | ICD-10-CM

## 2017-10-12 DIAGNOSIS — Z6827 Body mass index (BMI) 27.0-27.9, adult: Secondary | ICD-10-CM

## 2017-10-12 DIAGNOSIS — Z01818 Encounter for other preprocedural examination: Secondary | ICD-10-CM

## 2017-10-12 DIAGNOSIS — M542 Cervicalgia: Secondary | ICD-10-CM

## 2017-10-12 DIAGNOSIS — M2021 Hallux rigidus, right foot: Secondary | ICD-10-CM

## 2017-10-12 DIAGNOSIS — R739 Hyperglycemia, unspecified: Secondary | ICD-10-CM | POA: Insufficient documentation

## 2017-10-12 DIAGNOSIS — G894 Chronic pain syndrome: Secondary | ICD-10-CM

## 2017-10-12 DIAGNOSIS — I619 Nontraumatic intracerebral hemorrhage, unspecified: Secondary | ICD-10-CM

## 2017-10-12 DIAGNOSIS — M62838 Other muscle spasm: Secondary | ICD-10-CM

## 2017-10-12 LAB — EKG 12 LEAD
Atrial Rate: 86 {beats}/min
Diagnosis: NORMAL
P Axis: 48 degrees
P-R Interval: 154 ms
Q-T Interval: 356 ms
QRS Duration: 72 ms
QTC Calculation: 426 ms
R Axis: 39 degrees
T Axis: 44 degrees
Ventricular Rate: 86 {beats}/min

## 2017-10-12 LAB — COMPREHENSIVE METABOLIC PANEL
ALT (GPT): 32 U/L (ref 10–48)
AST (GOT): 33 U/L (ref 9–38)
Albumin: 4.5 g/dL (ref 3.5–5.2)
Alkaline Phosphatase (Total): 91 U/L (ref 36–161)
Anion Gap: 10 (ref 4–12)
Bilirubin (Total): 0.6 mg/dL (ref 0.2–1.3)
Calcium: 9.4 mg/dL (ref 8.9–10.2)
Carbon Dioxide, Total: 27 meq/L (ref 22–32)
Chloride: 101 meq/L (ref 98–108)
Creatinine: 1.1 mg/dL (ref 0.51–1.18)
GFR, Calc, African American: >60 mL/min/{1.73_m2} (ref >59)
GFR, Calc, European American: >60 mL/min/{1.73_m2} (ref >59)
Glucose: 117 mg/dL (ref 62–125)
Potassium: 4.1 meq/L (ref 3.6–5.2)
Protein (Total): 6.9 g/dL (ref 6.0–8.2)
Sodium: 138 meq/L (ref 135–145)
Urea Nitrogen: 20 mg/dL (ref 8–21)

## 2017-10-12 LAB — THYROID STIMULATING HORMONE: Thyroid Stimulating Hormone: 0.936 u[IU]/mL (ref 0.400–5.000)

## 2017-10-12 LAB — LAB ADD ON ORDER

## 2017-10-12 LAB — LIPID PANEL
Cholesterol (LDL): 71 mg/dL (ref <130)
Cholesterol/HDL Ratio: 2
HDL Cholesterol: 119 mg/dL (ref >39)
Non-HDL Cholesterol: 121 mg/dL (ref 0–159)
Total Cholesterol: 240 mg/dL — ABNORMAL HIGH (ref <200)
Triglyceride: 248 mg/dL — ABNORMAL HIGH (ref <150)

## 2017-10-12 LAB — T4, FREE: Thyroxine (Free): 0.7 ng/dL (ref 0.6–1.2)

## 2017-10-12 MED ORDER — DULOXETINE HCL 60 MG OR CPEP
60.0000 mg | DELAYED_RELEASE_CAPSULE | Freq: Every day | ORAL | 1 refills | Status: DC
Start: 2017-10-12 — End: 2018-05-23

## 2017-10-12 MED ORDER — AMLODIPINE BESYLATE 5 MG OR TABS
5.0000 mg | ORAL_TABLET | Freq: Two times a day (BID) | ORAL | 1 refills | Status: DC
Start: 2017-10-12 — End: 2018-01-17

## 2017-10-12 MED ORDER — DULOXETINE HCL 30 MG OR CPEP
30.0000 mg | DELAYED_RELEASE_CAPSULE | Freq: Every day | ORAL | 1 refills | Status: DC
Start: 2017-10-12 — End: 2018-06-13

## 2017-10-12 MED ORDER — METHOCARBAMOL 500 MG OR TABS
ORAL_TABLET | ORAL | 3 refills | Status: DC
Start: 2017-10-12 — End: 2018-02-18

## 2017-10-12 NOTE — Progress Notes (Signed)
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    Diagnoses and all orders for this visit:    Pre-op evaluation  -     EKG 12-LEAD    Hallux rigidus of right foot  -     EKG 12-LEAD    Muscle spasm  Comments:  he wanted more med  already on occ muscle relaxant at night  norcotic not ok  tylenol can take 1 qid   Orders:  -     Methocarbamol 500 MG Oral Tab; one at night for leg spasm may take 1/2 to one in am if not driving    Chronic pain syndrome  -     DULoxetine HCl 30 MG Oral CAPSULE ENTERIC COATED PARTICLES; Take 1 capsule (30 mg) by mouth daily.  -     DULoxetine HCl 60 MG Oral CAPSULE ENTERIC COATED PARTICLES; Take 1 capsule (60 mg) by mouth daily.    Cervicalgia  -     DULoxetine HCl 30 MG Oral CAPSULE ENTERIC COATED PARTICLES; Take 1 capsule (30 mg) by mouth daily.  -     DULoxetine HCl 60 MG Oral CAPSULE ENTERIC COATED PARTICLES; Take 1 capsule (60 mg) by mouth daily.    Intraparenchymal hemorrhage of brain (HCC)  -     amLODIPine 5 MG Oral Tab; Take 1 tablet (5 mg) by mouth 2 times a day.    Idiopathic peripheral neuropathy  -     amLODIPine 5 MG Oral Tab; Take 1 tablet (5 mg) by mouth 2 times a day.    ekg ok   From my standpoint he looks quite stable and no contraindication to surgery  He no longer is drinking alcohol for that is not a problem  His mind appears to be more lucid than when he first had the cerebral bleed    He will be seeing  cardiology    His medication are continued he is on methocarbamol and he has seen the pain clinic  He has been taking a little bit more he said at the suggestion of Dr. Alan MulderLiam  However I will go ahead and bump up his medication dosage  But he is not to increase the medication in the future without any prescription in hand  Continue on all his medications    His blood pressure is well controlled    From my standpoint no major contraindication to surgery    Health Maintainace reviewed with patient today that are due  Health Maintenance   Topic Date Due    Lipid Disorders Screening  02/11/2018    Zoster Vaccine (2 of 3) 09/03/2018 (Originally 07/17/2013)    Pneumococcal Vaccine (2 of 2 - PPSV23) 09/17/2018  Depression Screening (PHQ-2)  10/13/2018    Diabetes Screening  02/12/2020    Colorectal Cancer Screening (Colonoscopy)  09/24/2020    Tetanus Vaccine  11/29/2025    Influenza Vaccine  Completed    Hepatitis C Screening  Addressed    HIV Screening  Addressed       No follow-ups on file.    Patient   express understanding of care plan/medications    Created with the assistance of voice recognition software dictation      Risk of taking medication properly and follow up discussed especially to call if problem  MEDICATION as of Discharge  Current Outpatient Medications   Medication Sig Dispense Refill    Acetaminophen 500 MG Oral Tab 2 tablets bid      amLODIPine 5 MG Oral Tab Take 1 tablet (5 mg) by mouth 2 times a day. 90 tablet 1    Atorvastatin Calcium 10 MG Oral Tab Take 1 tablet (10 mg) by mouth daily. 90 tablet 2    Cholecalciferol (VITAMIN D3) 2000 units Oral Cap Take 1 capsule (2,000 Units) by mouth daily. For low level 1 capsule 1    Clobetasol Propionate 0.05 % External Ointment Small amount to rash as needed bid for 1-2 weeks at a time 15 g 1    Cyanocobalamin 1000 MCG Oral Tab one per day over-the-counter started 5/21 /2012 this level borderline low 1 Tab 1    DICLOFENAC OR Take 75 mg by  mouth.      Diclofenac Sodium (VOLTAREN) 1 % Transdermal Gel Apply 2 g topically 4 times a day. Apply to neck, trapezius muscle, and low back. No more than 2g applied at a time. 1 Tube 5    DULoxetine HCl 30 MG Oral CAPSULE ENTERIC COATED PARTICLES Take 1 capsule (30 mg) by mouth daily. 90 capsule 1    DULoxetine HCl 60 MG Oral CAPSULE ENTERIC COATED PARTICLES Take 1 capsule (60 mg) by mouth daily. 90 capsule 1    EPINEPHrine 0.3 MG/0.3ML Injection Solution Auto-injector Inject as instructed per patient package insert, 0.3 mg intramuscularly or subcutaneously into the thigh, if needed to treat anaphylaxis 3 each 1    Lidocaine 5 % External Patch Apply 1 patch onto the skin daily. Apply to painful area for up to 12 hours in a 24 hour period. 30 patch 10    Lisinopril 20 MG Oral Tab Take 1 tablet (20 mg) by mouth every 12 hours. 180 tablet 3    Methocarbamol 500 MG Oral Tab one at night for leg spasm may take 1/2 to one in am if not driving 60 tablet 3    Naproxen Sodium 220 MG Oral Cap Take 2 capsules (440 mg) by mouth every 12 hours as needed. 1 capsule 1    Pregabalin 100 MG Oral Cap Take 1 capsule (100 mg) by mouth 2 times a day. 180 capsule 1    Pregabalin 25 MG Oral Cap 1  po bid 05/21/2017 180 capsule 1    Primidone 50 MG Oral Tab Take 1 tablet (50 mg) by mouth 2 times a day. 180 tablet 0    THIAMINE HCL OR       Triamcinolone Acetonide 0.1 % External Cream As needed to right ear bid x 1 week 1 Tube 0     No current facility-administered medications for this visit.         ++++++++++++++++++++++++++++++++++++++++++++++++++++++++++++++  Start of visit   Ronnie Mason is a 67 year old male  BP 136/74    Pulse 92    Temp 97.4 F (36.3 C) (Oral)    Ht 6' 3.98" (1.93 m)    Wt (!) 224 lb (101.6 kg)    SpO2 96%    BMI 27.28 kg/m   Chief Complaint   Patient presents with    Pre-Op Exam     foot surgery 10/21/17     HPI  History of:    pcp [name], MD reviewed last visit  09/17/2017        Last visit w Dr Juanetta Beets 01/2016 - in his note   Past alcohol , thn , essential tremmor  He was seen at [facility] ER on 11/30/15 after being found down outside of his house on his driveway. He had initial right-sided paralysis which resolved by the time he arrived to the hospital.Glucose in the field was 143. CT of the head noted a small punctate high left frontal hyperdensity concerning for possible contusion, and a 1 x 1 cm left thalamic hyperdensity consistent with a thalamic hemorrhage of hypertensive etiology.    Dr. 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 Mayford Knife for evaluation of possible normal pressure hydrocephalus       Has been free of alcohol      Last visit Dr Britta Mccreedy Distad 06/2017 - neurology  - on the idiopathic peripheral neuropathy    Dr [name] on epidural steroid injection    Left knee meniscus torn , form wretching diog calf spasm   IMPRESSION : There is electrodiagnostic evidence of a length dependent sensory axonal-predominant polyneuropathy, and no electrodiagnostic evidence of L5-S1 radiculopathy or CIDP.   Melvenia Beam, MD Neuromuscular Fellow   there is no evidence of a demyelinating disease such as CIDP, nor of an active radiculopathy or active denervation.    The spasming is not likely coming from nerves, it is probably coming from muscles. The toe and knee issues are likely joint related rather than nerve related. In general, the gait issues are likely evolving sequelae of stroke combined with sensory ataxia from the numbness/neuropathy, as well as back pain (the latter is greatly improved with steroid injections). The spasming may be tough to treat.            Patient Care Team:  [name], MD as PCP - General (Internal Medicine)  [name], MD (Internal Medicine)  Jeanella Craze.,  Bryan Lemma, MD as Cardiologist  (Cardiology)  Geri Seminole, MD (Sports Medicine)  Holli Humbles, DPM (Podiatry)       Today follow-up above issues:     No alcohol  now   No more falling    No confusion    No HA   History oh he will be having surgery   He is being managed for his body aches without narcotics and he is on some muscle relaxant cause of his spasms which she describes as being very helpful for his legs      Office Visit on 03/29/17   1. HEPATITIS B Battery (HBsAg w/rflx PCR, anti-HBs, anit-HBc)   Result Value Ref Range    Hepatitis B S Ag w/Rflx PCR Nonreactive NREAC    Hepatitis B Surface Ab Nonreactive     Hepatitis B Surf Antibody Intl Units <8.00 m[IU]/mL    Hepatitis B Core Ab Nonreactive NREAC    Hepatitis  B Battery Interp       No evidence of past or present Hepatitis B infection.   2. Hepatitis C Ab with REFLEX PCR   Result Value Ref Range    Hepatitis C Antibody w/Rflx PCR Nonreactive NREAC    Hepatitis C Antibody Interpretation       No evidence of antibody to hepatitis C virus (HCV).  Anti-HCV may develop slowly (6 weeks - 6 months) over the course of hepatitis C virus infection.  Rarely it may be absent in cases of chronic   infection.  Anti-HCV may also disappear after recovery from acute, self-limited disease.     3. ANA REFLEX COMPREHENSIVE PANEL   Result Value Ref Range    Antibodies to Nuclear Ags by IF (ANA) Negative NRN    ANA Pattern by IF None     Ana Screen By Multiplex Negative NRN    Ana Interpretation Comment 1 Screen negative by IFA and multiplex.    4. CRYOGLOBULINS   Result Value Ref Range    Cryoglobulin, Serum At 3 Day Negative NRN %    Cryoglobulin, Serum At 10 Day Negative NRN %    Cryoglobulins Method       This test was developed and its performance characteristics determined by the [facility] Department of Laboratory Medicine in a manner consistent with CLIA requirements. This test has   not been cleared or approved by the [place] FDA.          MEDICATION PRIOR TO VISIT  Reports   Outpatient Medications Prior to Visit   Medication Sig Dispense Refill    Acetaminophen 500 MG Oral Tab 2 tablets bid      AmLODIPine Besylate 5 MG Oral Tab Take 1 tablet (5 mg) by mouth 2 times a day. 90 tablet 1    Atorvastatin Calcium 10 MG Oral Tab Take 1 tablet (10 mg) by mouth daily. 90 tablet 2    Cholecalciferol (VITAMIN D3) 2000 units Oral Cap Take 1 capsule (2,000 Units) by mouth daily. For low level 1 capsule 1    Clobetasol Propionate 0.05 % External Ointment Small amount to rash as needed bid for 1-2 weeks at a time 15 g 1    Cyanocobalamin 1000 MCG Oral Tab one per day over-the-counter started 5/21 /2012 this level borderline low 1 Tab 1    DICLOFENAC OR Take 75 mg by mouth.      Diclofenac Sodium (VOLTAREN) 1 % Transdermal Gel Apply 2 g topically 4 times a day. Apply to neck, trapezius muscle, and low back. No more than 2g applied at a time. 1 Tube 5    DULoxetine HCl 30 MG Oral CAPSULE ENTERIC COATED PARTICLES Take 1 capsule (30 mg) by mouth daily. 90 capsule 1    DULoxetine HCl 60 MG Oral CAPSULE ENTERIC COATED PARTICLES Take 1 capsule (60 mg) by mouth daily. 90 capsule 1    EPINEPHrine 0.3 MG/0.3ML Injection Solution Auto-injector Inject as instructed per patient package insert, 0.3 mg intramuscularly or subcutaneously into the thigh, if needed to treat anaphylaxis 3 each 1    Lidocaine 5 % External Patch Apply 1 patch onto the skin daily. Apply to painful area for up to 12 hours in a 24 hour period. 30 patch 10    Lisinopril 20 MG Oral Tab Take 1 tablet (20 mg) by mouth every 12 hours. 180 tablet 3    Methocarbamol 500 MG Oral Tab 1/2 to one at night for leg spasm 30  tablet 2    Naproxen Sodium 220 MG Oral Cap Take 2 capsules (440 mg) by mouth every 12 hours as needed. 1 capsule 1    Pregabalin 100 MG Oral Cap Take 1 capsule (100 mg) by mouth 2 times a day. 180 capsule 1    Pregabalin 25 MG Oral Cap 1  po bid 05/21/2017 180 capsule 1     Primidone 50 MG Oral Tab Take 1 tablet (50 mg) by mouth 2 times a day. 180 tablet 0    THIAMINE HCL OR       Triamcinolone Acetonide 0.1 % External Cream As needed to right ear bid x 1 week 1 Tube 0     No facility-administered medications prior to visit.        REVIEW OF SYSTEMS - are otherwise negative unless as noted in HPI above or as additional issues below  Constitutional:     Eyes:    Head ears, nose mouth throat:   Cardiovascular: Has not had any cardiovascular symptoms  Respiratory:   Gastrointestinal:   Genitourinary:    Musculoskeletal:    Integumentary:   Neurological:   Psychiatric/Behavioral:  He is quite happy new grand baby as of 2 days ago   Endocrine:   Hematological:   Allergic/Immunologic:     PHYSICAL EXAM  BP 136/74    Pulse 92    Temp 97.4 F (36.3 C) (Oral)    Ht 6' 3.98" (1.93 m)    Wt (!) 224 lb (101.6 kg)    SpO2 96%    BMI 27.28 kg/m   Well Dressed     Constitution, In general looks:   well , good coloring  No diaphoresis  Patient is Alert  & Attentive  Patient is conversive    Psychiatric Mental Status / Neurologic Mental Status:  Affect:  Pleasant calm  Mood:  Good  Awake: Alert and attentive    Not confused  Thought::     content : no gross issue   Process : logical  Language:   Speech is fluent  Intact comprehension    Eyes: no discharge, no gross puffiness, non- icteric  Ears: clear  Nose: clear  Mouth & Throat: no lesions or exudate  Neck:    Thyroid not enlarged  2+ carotid pulses no bruits  Lymph nodes:  No gross cervical swelling  No gross axillary swelling    Cardiovascular:   Heart Regular rate , rhythm, no murmur  No edema  Respiratory:   Not tachyphpnic  Clear to ascultation  No rales, rhonchi , without increased effort or stridor  No clubbing     Abdomen / Gastrointestinal:  Soft.     Neurologic:   Grossly intact  Memory:   Attention       Cranial Nerve    Eyes pupil and lid are intact   II, III, IV, VIII   Facial Movement VII: grossly intact, no facial  droop     Motor- Gait-coordination: get up and go normal, no gross weakness   Gait- base normal appropriate for age   Get up and go -no problem    Negative pronator drift    Musculoskeletal:  No gross abnormal movement, twitch   No gross atrophy or deformity    Skin:  No gross lesion or rash    Hematologic/Lymphatic /Immunologic:  No gross bruising or rash or hives     ++++++++++++++++++++++++++++++++++++++++++++++++++  Allergy  Review of patient's allergies indicates:  Allergies   Allergen  Reactions    Adhesives Rash    Bee Venom Hives, Itching and Swelling    Gabapentin      Flu like symptoms, diarrhea, body ache upset stomach       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]     URI (upper respiratory infection) [J06.9]     Right lumbar radiculitis [M54.16] 05/05/2017    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] 01/11/2017     Osteoarthritis of right hip x ray report - moderate to advanced radiology read 01/11/2017      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] 08/06/2016    Impaired mobility and ADLs [Z74.09] 04/08/2016    Difficulty in walking, not elsewhere classified [R26.2] 03/12/2016    Vitamin D deficiency [E55.9] 02/13/2016    Balance problem [R26.89] 01/23/2016    Chronic pain of both shoulders [M25.511, G89.29, M25.512] 01/23/2016     X ray arthritis  02/19/2016        Pain of right hip joint [M25.551] 01/23/2016    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) [G91.2] 01/15/2016    Intraparenchymal hemorrhage of brain (HCC) [I61.9] 01/06/2016    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, 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, 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] 01/06/2016    Essential hypertension [I10] 12/16/2015    Essential tremor [G25.0] 12/16/2015    Hx of spontan intraparenchymal intracran  bleed assoc with hypertension [Z86.79] 12/16/2015     [place] 4/29- 12/10/2015  Non-traumatic intraparenchymal hemorrhage, hypertensive of the left thalamus   Traumatic hemorrhage   -- falcine subdural hemorrhage, small cortical subarachnoid      Alcohol abuse [F10.10] 12/16/2015    Cerebral ventriculomegaly [G93.89] 10/09/2015    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    History of alcohol dependence (HCC) [F10.21] 06/26/2015    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) [G37.9] 05/07/2015    Idiopathic peripheral neuropathy [G60.9] 03/15/2015    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] 05/28/2014    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] 01/18/2014    Tremor [R25.1] 04/20/2013    Fall [W19.XXXA] 02/23/2013     BP < 100  Occasioanal tri[p and leg weakness      Back pain, lumbosacral [M54.5] 02/15/2013    Lumbar radiculopathy, chronic [M54.16] 02/15/2013    Neck pain [M54.2] 02/15/2013    Chronic pain [G89.29] 12/21/2012     Now at [facility], DO, Santiago Glad    Neck pain -burn c3-7 on left side per patient  Some trigger injection    Lumbar pain-since 80's better with exercise      Bee sting allergy [Z91.030] 12/21/2012     hives      Numbness and tingling of leg [R20.0, R20.2] 12/21/2012     Left calf -left foot long term suspect from back-suspect L 45      Chronic back pain [M54.9, G89.29] 12/21/2012     Mri in mid scape 1/214  Multilevel degenerative disc disease of the lumbar spine. Circumferential disc bulge at all levels below and including L1-2. No significant central spinal canal stenosis or neuroforaminal narrowing.  2. Mild retrolisthesis of L5 on S1.            B12 deficiency [E53.8] 12/21/2012     Borderline low 5 /21/2014      Chronic renal insufficiency, stage III (moderate) (HCC) [N18.3]  12/21/2012     5/ 2013      Hyperlipidemia [E78.5] 09/26/2012    Cervicalgia [M54.2] 03/11/2010     Radiofrequency ablation of c3 to c 7            Past Medical History:   Diagnosis Date    Carotid Sinus Hypersensitivity     Disc disorder of lumbosacral region     HYPERTENSION      HYPOTENSION      Intraparenchymal hemorrhage of brain (HCC) 2017    Pain of right hip joint 01/23/2016    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 myelopathy or radiculopathy 05/28/2014    Syncope     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(HCC)     URI (upper respiratory infection)        Social History     Socioeconomic History    Marital status: Married     Spouse name: Not on file    Number of children: Not on file    Years of education: Not on file    Highest education level: Not on file   Social Needs    Financial resource strain: Not on file    Food insecurity - worry: Not on file    Food insecurity - inability: Not on file    Transportation needs - medical: Not on file    Transportation needs - non-medical: Not on file   Occupational History    Not on file   Tobacco Use    Smoking status: Never Smoker    Smokeless tobacco: Former Estate agent and Sexual Activity    Alcohol use: Yes    Drug use: No    Sexual activity: Not on file   Other Topics Concern    Not on file   Social History Narrative    ** Merged History Encounter **              Family History     Problem (# of Occurrences) Relation (Name,Age of Onset)    Colon Cancer (1) Father    Heart (other) (1) Mother: MI    Other Family Hx (1) Other: myelodysplasia          Social History     Socioeconomic History    Marital status: Married     Spouse name: Not on file    Number of children: Not on file    Years of education: Not on file    Highest education level: Not on file   Social Needs    Financial resource strain: Not on file    Food insecurity - worry: Not on file    Food insecurity - inability: Not on file    Transportation needs - medical: Not on file    Transportation needs - non-medical: Not on file   Occupational History    Not on file   Tobacco Use    Smoking status: Never Smoker     Smokeless tobacco: Former Estate agent and Sexual Activity    Alcohol use: Yes    Drug use: No    Sexual activity: Not on file   Other Topics Concern    Not on file   Social History Narrative    ** Merged History Encounter **

## 2017-10-12 NOTE — Progress Notes (Signed)
Does patient have 3 or more complaints:  NO  If yes, discuss with patient the need to schedule a follow up appointment due to limited appointment time.  Follow up appointment scheduled:  NO  Care Everywhere records available/ reconciled:  YES  PHQ2 complete:  YES  PHQ9 complete:  NO  Refills pended:  NO  Orders pended:  NO  Medications to discontinue:  NO    Health Maintenance updated:  NO    Health Maintenance   Topic Date Due    Lipid Disorders Screening  02/11/2018    Zoster Vaccine (2 of 3) 09/03/2018 (Originally 07/17/2013)    Depression Screening (PHQ-2)  05/21/2018    Pneumococcal Vaccine (2 of 2 - PPSV23) 09/17/2018    Diabetes Screening  02/12/2020    Colorectal Cancer Screening (Colonoscopy)  09/24/2020    Tetanus Vaccine  11/29/2025    Influenza Vaccine  Completed    Hepatitis C Screening  Addressed    HIV Screening  Addressed

## 2017-10-12 NOTE — Result Encounter Note (Signed)
Glucose is a little up   Maybe not ok  aic is pending  Will let you know  Were you fasting ?     On atorvastain   LDL is ok   But triglycerides up   Will repeat thyroid lab     aic is not back     Orders Only on 10/12/17  1. Lipid Panel       Result                                            Value                         Ref Range                       Cholesterol (Total)                               240 (H)                       <200 mg/dL                      Triglyceride                                      248 (H)                       <150 mg/dL                      Cholesterol (HDL)                                 119                           >39 mg/dL                       Cholesterol (LDL)                                 71                            <130 mg/dL                      Non-HDL Cholesterol                               121                           0 - 159 mg/dL                   Cholesterol/HDL Ratio  2.0                                                           Lipid Panel, Additional Info.                     (NOTE)                                                   2. Comprehensive Metabolic Panel       Result                                            Value                         Ref Range                       Sodium                                            138                           135 - 145 meq/L                 Potassium                                         4.1                           3.6 - 5.2 meq/L                 Chloride                                          101                           98 - 108 meq/L                  Carbon Dioxide, Total                             27                            22 - 32 meq/L                   Anion Gap  10                            4 - 12                          Glucose                                           117                           62 - 125 mg/dL                   Urea Nitrogen                                     20                            8 - 21 mg/dL                    Creatinine                                        1.10                          0.51 - 1.18 mg/dL               Protein (Total)                                   6.9                           6.0 - 8.2 g/dL                  Albumin                                           4.5                           3.5 - 5.2 g/dL                  Bilirubin (Total)                                 0.6                           0.2 - 1.3 mg/dL                 Calcium  9.4                           8.9 - 10.2 mg/dL                AST (GOT)                                         33                            9 - 38 U/L                      Alkaline Phosphatase (Total)                      91                            36 - 161 U/L                    ALT (GPT)                                         32                            10 - 48 U/L                     GFR, Calc, European American                      >60                           >59 mL/min/[1.73_m2]            GFR, Calc, African American                       >60                           >59 mL/min/[1.73_m2]            GFR, Information                                                                                            Calculated GFR in mL/min/1.73 m2 by MDRD equation.  Inaccurate with changing renal function.  See [email or web address]

## 2017-10-13 ENCOUNTER — Encounter (INDEPENDENT_AMBULATORY_CARE_PROVIDER_SITE_OTHER): Payer: Self-pay | Admitting: Internal Medicine

## 2017-10-13 LAB — HEMOGLOBIN A1C, HPLC: Hemoglobin A1C: 5.3 % (ref 4.0–6.0)

## 2017-10-13 NOTE — Result Encounter Note (Signed)
Addendum   Cardiology read negative EKG  Forward chart to Dr. Renard MatterMcInnis

## 2017-10-14 ENCOUNTER — Ambulatory Visit (INDEPENDENT_AMBULATORY_CARE_PROVIDER_SITE_OTHER): Payer: PPO | Admitting: Cardiovascular Disease

## 2017-10-14 ENCOUNTER — Encounter (INDEPENDENT_AMBULATORY_CARE_PROVIDER_SITE_OTHER): Payer: Self-pay | Admitting: Cardiovascular Disease

## 2017-10-14 ENCOUNTER — Telehealth (INDEPENDENT_AMBULATORY_CARE_PROVIDER_SITE_OTHER): Payer: Self-pay | Admitting: Cardiovascular Disease

## 2017-10-14 VITALS — BP 110/72 | HR 102 | Ht 75.98 in | Wt 224.0 lb

## 2017-10-14 DIAGNOSIS — Z01810 Encounter for preprocedural cardiovascular examination: Secondary | ICD-10-CM

## 2017-10-14 DIAGNOSIS — E782 Mixed hyperlipidemia: Secondary | ICD-10-CM

## 2017-10-14 DIAGNOSIS — Z6827 Body mass index (BMI) 27.0-27.9, adult: Secondary | ICD-10-CM

## 2017-10-14 DIAGNOSIS — G8929 Other chronic pain: Secondary | ICD-10-CM

## 2017-10-14 DIAGNOSIS — E669 Obesity, unspecified: Secondary | ICD-10-CM

## 2017-10-14 DIAGNOSIS — I10 Essential (primary) hypertension: Secondary | ICD-10-CM

## 2017-10-14 MED ORDER — LISINOPRIL 20 MG OR TABS
20.0000 mg | ORAL_TABLET | Freq: Two times a day (BID) | ORAL | 3 refills | Status: DC
Start: 2017-10-14 — End: 2018-07-25

## 2017-10-14 NOTE — Patient Instructions (Addendum)
No changes    No issues with surgery

## 2017-10-14 NOTE — Telephone Encounter (Signed)
Dr DV Aundria RudWilkinson ordered referral to nutrition for counseling regarding weight and cholesterol management.

## 2017-10-18 ENCOUNTER — Encounter (INDEPENDENT_AMBULATORY_CARE_PROVIDER_SITE_OTHER): Payer: Self-pay | Admitting: Podiatrist

## 2017-10-18 ENCOUNTER — Ambulatory Visit (INDEPENDENT_AMBULATORY_CARE_PROVIDER_SITE_OTHER): Payer: PPO | Admitting: Podiatrist

## 2017-10-18 DIAGNOSIS — M19071 Primary osteoarthritis, right ankle and foot: Secondary | ICD-10-CM

## 2017-10-18 DIAGNOSIS — M2021 Hallux rigidus, right foot: Secondary | ICD-10-CM

## 2017-10-18 NOTE — Progress Notes (Signed)
Ronnie Mason  PCP:  [name], MD     Visit type: Established patient extended visit for surgical consultation right great toe joint.  Follow-up after his primary care history and physical exam as well as cardiology consult.    SUBJECTIVE:      1.)  Continued right great toe joint problems due to arthrosis and loose body.  2.)  Plan for cheilectomy, joint cleanup.  3.)  Saw Dr. Marisa Sprinkles, thorough preoperative history and physical done including EKG.  No contraindications to surgery.  4.)  Also cell cardiology.  5.)  Was on chronic narcotics I previously for back pain.  We'll be using Percocet during the first week postoperative, should not needed after that.  For acute postoperative pain only.  Discussed that with him today.  6.)  Has a scooter and crutches and we talked about nonweightbearing up to 4 weeks.  After that a walking boot and then gradually into shoes and activities.  7.)  His left knee seems to be doing fine.  He's gone to physical therapy and doesn't feel limitations with respect to it at this juncture.  8.)  Today he is complaining that anesthesia "keeps calling him".  This is a typical preoperative protocol to touch base with patient's and go through a series of preoperative questions.  For some reason he does not want to do this and instead wants to go in and talk with someone at the anesthesia department, not typical protocol.  I encouraged him to talk on the phone with him as usual.  He'll office he talk with him the morning of surgery.  He seems dead set on trying to go find someone in person to do this for some reason despite my suggestion.    OBJECTIVE:     1.)  Right great toe joint with limitation, effusion.  2.)  X-rays show arthritic changes, grade 2 over 4 with loose body.    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  With joint cleanup  we will be removing a loose body, exostectomy of any marginal osteophytes, synovectomy, cartilage evaluation and excision of any loose pieces or flaps.  2.)  From an expectations standpoint he does have arthrosis of this joint and is possible despite surgical intervention peek could have persistent or even worsening symptoms in the future.  Could need further surgery with arthrodesis or otherwise.  3.)  He's also had other foot pain related to neuropathy etc.  As I talked with him about before the surgery will address his issues in the joint but will not affect any neuropathic type pain.  4.)  Postoperative pain medication with Percocet.  Patient's are typically off of this medication within the first 2 or 3 days and certainly by the first week postoperative.  No plans to have him on longer than that.  5.)  Reviewed the nonweightbearing period of up to 4 weeks.  Then he'll need a walking boot for 2-4 weeks and then gradual progression to shoes and activities thereafter.    Informed consent was performed by me and afterward the patient was instructed on the peri-operative logistics by my staff.  As part of this, the patient was instructed to review the document "Surgery and Post-operative Recovery - Patient Guide to Navigating the Process" authored by myself.  This document  provides detailed information regarding the entire peri-operative experience including important post-operative instructions.         25 minutes were spent face-to-face contact with the patient, greater than 50% of which was spent counseling and coordinating care.        Ronnie Mason, DPM  [facility]    Portions of this document were created with voice-recognition software and may contain occasional errant or misspelled words.

## 2017-10-20 NOTE — Progress Notes (Signed)
Cardiovascular Clinical Evaluation Note    Primary Care Provider: Shann MedalKraft, Vara Viseskul, MD    Referring Provider: No ref. provider found    DOB:  10/16/1950    CHIEF COMPLAINT:  Hypertension    HISTORY OF PRESENT ILLNESS:  Ronnie Mason  reports blood pressures under good control.  Part of this relates to pain control.  He still has nerve pain at L5-S1.  He has also set to undergo right foot operation as he cannot walk.  He has not had any cardiac complaints.   There is no recurrent syncope or presyncope.    Recent labs show normal electrolytes.  Creatinine is 1.1    LDL is 71 but with elevated triglycerides at 248.    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) [S06.9X9A] 08/06/2016    Impaired mobility and ADLs [Z74.09] 04/08/2016    Difficulty in walking, not elsewhere classified [R26.2] 03/12/2016    Vitamin D deficiency [E55.9] 02/13/2016    Balance problem [R26.89] 01/23/2016    Chronic pain of both shoulders [M25.511, G89.29, M25.512] 01/23/2016     X ray arthritis  02/19/2016        Pain of right hip joint [M25.551] 01/23/2016    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) [G91.2] 01/15/2016    Intraparenchymal hemorrhage of brain (HCC) [I61.9] 01/06/2016    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, 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, 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] 01/06/2016    Essential hypertension [I10] 12/16/2015    Essential tremor [G25.0] 12/16/2015    Hx of spontan intraparenchymal  intracran bleed assoc with hypertension [Z86.79] 12/16/2015     [facility] -[facility] 4/29- 12/10/2015  Non-traumatic intraparenchymal hemorrhage, hypertensive of the left thalamus   Traumatic hemorrhage   -- falcine subdural hemorrhage, small cortical subarachnoid      Alcohol abuse [F10.10] 12/16/2015    Cerebral ventriculomegaly [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.89] 10/09/2015    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    History of alcohol dependence (HCC) [F10.21] 06/26/2015    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) [G37.9] 05/07/2015    Idiopathic peripheral neuropathy [G60.9] 03/15/2015    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] 05/28/2014    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] 01/18/2014    Tremor [R25.1] 04/20/2013    Fall [W19.XXXA] 02/23/2013     BP < 100  Occasioanal tri[p and leg weakness      Back pain, lumbosacral [M54.5] 02/15/2013    Lumbar radiculopathy, chronic [M54.16] 02/15/2013    Neck pain [M54.2] 02/15/2013    Chronic pain [G89.29] 12/21/2012     [facility], DO, Santiago Gladebecca Capen    Neck pain -burn c3-7 on left side per patient  Some trigger injection    Lumbar pain-since 80's better with exercise      Bee sting allergy [Z91.030] 12/21/2012     hives      Numbness and tingling of leg [R20.0, R20.2] 12/21/2012  Left calf -left foot long term suspect from back-suspect L 45      Chronic back pain [M54.9, G89.29] 12/21/2012     Mri in mid scape 1/214  Multilevel degenerative disc disease of the lumbar spine. Circumferential disc bulge at all levels below and including L1-2. No significant central spinal canal stenosis or neuroforaminal narrowing.  2. Mild retrolisthesis of L5 on S1.            B12 deficiency [E53.8] 12/21/2012     Borderline low 5 /21/2014      Chronic renal insufficiency, stage III (moderate) (HCC) [N18.3] 12/21/2012     5/ 2013      Hyperlipidemia [E78.5] 09/26/2012       MEDICATIONS:    Current Outpatient Medications   Medication Sig Dispense  Refill    Acetaminophen 500 MG Oral Tab 2 tablets bid      amLODIPine 5 MG Oral Tab Take 1 tablet (5 mg) by mouth 2 times a day. 90 tablet 1    Atorvastatin Calcium 10 MG Oral Tab Take 1 tablet (10 mg) by mouth daily. 90 tablet 2    Cholecalciferol (VITAMIN D3) 2000 units Oral Cap Take 1 capsule (2,000 Units) by mouth daily. For low level 1 capsule 1    Clobetasol Propionate 0.05 % External Ointment Small amount to rash as needed bid for 1-2 weeks at a time 15 g 1    Cyanocobalamin 1000 MCG Oral Tab one per day over-the-counter started 5/21 /2012 this level borderline low 1 Tab 1    DICLOFENAC OR Take 75 mg by mouth.      Diclofenac Sodium (VOLTAREN) 1 % Transdermal Gel Apply 2 g topically 4 times a day. Apply to neck, trapezius muscle, and low back. No more than 2g applied at a time. 1 Tube 5    DULoxetine HCl 30 MG Oral CAPSULE ENTERIC COATED PARTICLES Take 1 capsule (30 mg) by mouth daily. 90 capsule 1    DULoxetine HCl 60 MG Oral CAPSULE ENTERIC COATED PARTICLES Take 1 capsule (60 mg) by mouth daily. 90 capsule 1    EPINEPHrine 0.3 MG/0.3ML Injection Solution Auto-injector Inject as instructed per patient package insert, 0.3 mg intramuscularly or subcutaneously into the thigh, if needed to treat anaphylaxis 3 each 1    Lidocaine 5 % External Patch Apply 1 patch onto the skin daily. Apply to painful area for up to 12 hours in a 24 hour period. 30 patch 10    Lisinopril 20 MG Oral Tab Take 1 tablet (20 mg) by mouth every 12 hours. 180 tablet 3    Methocarbamol 500 MG Oral Tab one at night for leg spasm may take 1/2 to one in am if not driving 60 tablet 3    Naproxen Sodium 220 MG Oral Cap Take 2 capsules (440 mg) by mouth every 12 hours as needed. 1 capsule 1    Pregabalin 100 MG Oral Cap Take 1 capsule (100 mg) by mouth 2 times a day. 180 capsule 1    Pregabalin 25 MG Oral Cap 1  po bid 05/21/2017 180 capsule 1    Primidone 50 MG Oral Tab Take 1 tablet (50 mg) by mouth 2 times a day. 180 tablet  0    THIAMINE HCL OR       Triamcinolone Acetonide 0.1 % External Cream As needed to right ear bid x 1 week 1 Tube 0     No current facility-administered medications for this visit.  ALLERGIES:  Adhesives; Bee venom; and Gabapentin      PSFH: Foot surgery next week as indicated above.  He has been now working on building a house on [employer].  He has no exertional symptoms suggestive of cardiac disease.    REVIEW OF SYSTEMS:  See Cardiovascular HPI for pertinent positives and negatives.   RESPIRATORY:  No sputum, hemoptysis.    PHYSICAL EXAM    CONSTITUTIONAL:  This is a well-nourished male in no distress.   BP 110/72    Pulse (!) 102    Ht 6' 3.98" (1.93 m) Comment: pt provided   Wt (!) 224 lb (101.6 kg) Comment: pt provided   BMI 27.28 kg/m   NECK:  Jugular venous pressure normal.  Thyroid is not enlarged.  RESPIRATORY:   Clear to auscultation.  CARDIOVASCULAR:  Regular rhythm.  S1 and S2 normal. No S3, S4, no murmur.  Carotid upstrokes are brisk without bruits.  EXTREMITIES:  No clubbing, cyanosis, edema or tenderness noted.      Twelve-lead EKG from 10/12/17 is normal    IMPRESSION :   1.  Hypertension, controlled  2.  Hypertriglyceridemia  3.  Pending right foot operation    PLAN:    I don't see any reason he can't proceed with the intended surgery.    Electronically signed by Eilleen Kempf, MD    10/20/2017  8:46 AM    CC:   [name], MD  No ref. provider found

## 2017-10-21 ENCOUNTER — Ambulatory Visit (HOSPITAL_COMMUNITY): Payer: Self-pay | Admitting: Podiatrist

## 2017-10-21 ENCOUNTER — Ambulatory Visit
Admission: RE | Admit: 2017-10-21 | Discharge: 2017-10-21 | Disposition: A | Discharge status: Home / Self Care | Payer: PPO | Attending: Podiatrist | Admitting: Podiatrist

## 2017-10-21 DIAGNOSIS — M19071 Primary osteoarthritis, right ankle and foot: Secondary | ICD-10-CM | POA: Insufficient documentation

## 2017-10-21 DIAGNOSIS — M62838 Other muscle spasm: Secondary | ICD-10-CM | POA: Insufficient documentation

## 2017-10-21 DIAGNOSIS — I10 Essential (primary) hypertension: Secondary | ICD-10-CM | POA: Insufficient documentation

## 2017-10-21 DIAGNOSIS — M2021 Hallux rigidus, right foot: Secondary | ICD-10-CM | POA: Insufficient documentation

## 2017-10-21 DIAGNOSIS — Z79899 Other long term (current) drug therapy: Secondary | ICD-10-CM | POA: Insufficient documentation

## 2017-10-21 DIAGNOSIS — G894 Chronic pain syndrome: Secondary | ICD-10-CM | POA: Insufficient documentation

## 2017-10-21 DIAGNOSIS — E785 Hyperlipidemia, unspecified: Secondary | ICD-10-CM | POA: Insufficient documentation

## 2017-10-21 HISTORY — PX: PR HALLUX RIGIDUS W/CHEILECTOMY 1ST MP JT W/O IMPLT: 28289

## 2017-10-22 ENCOUNTER — Encounter (INDEPENDENT_AMBULATORY_CARE_PROVIDER_SITE_OTHER): Payer: Self-pay | Admitting: Registered Nurse

## 2017-10-22 ENCOUNTER — Telehealth (INDEPENDENT_AMBULATORY_CARE_PROVIDER_SITE_OTHER): Payer: Self-pay | Admitting: Podiatrist

## 2017-10-22 NOTE — Telephone Encounter (Signed)
Spoke to patient, states doing well with pain management, no nausea or falls reported.

## 2017-10-27 ENCOUNTER — Ambulatory Visit (INDEPENDENT_AMBULATORY_CARE_PROVIDER_SITE_OTHER): Payer: PPO | Admitting: Podiatrist

## 2017-10-27 ENCOUNTER — Encounter (INDEPENDENT_AMBULATORY_CARE_PROVIDER_SITE_OTHER): Payer: Self-pay | Admitting: Podiatrist

## 2017-10-27 DIAGNOSIS — Z91199 Patient's noncompliance with other medical treatment and regimen due to unspecified reason: Secondary | ICD-10-CM

## 2017-10-27 DIAGNOSIS — M2021 Hallux rigidus, right foot: Secondary | ICD-10-CM

## 2017-10-27 DIAGNOSIS — Z9119 Patient's noncompliance with other medical treatment and regimen: Secondary | ICD-10-CM

## 2017-10-27 DIAGNOSIS — M19071 Primary osteoarthritis, right ankle and foot: Secondary | ICD-10-CM

## 2017-10-27 NOTE — Progress Notes (Addendum)
Ronnie Mason  PCP:  Shann MedalKraft, Vara Viseskul, MD    SUBJECTIVE:      1.)  Completely noncompliant with postoperative recommendations.  2.)  Presented weightbearing on his foot in the blue splint boot using 1 crutch despite strict and very clear nonweightbearing recommendations.  3.)  He says his foot feels "fine" and that he can walk on it.  Making his own decision and complete disregard for written and verbal postoperative instructions.  4.)  He also remove the dressing as he says it "fell off in one day".  The dressings we placed was 4 inch Kerlix taped on and then covered with a 4 inch Ace wrap securely.  Covered with a splint boot on top of that.  It takes work to have these "fall off".  5.)  Has some bleeding after surgery but this has stopped.  6.)  Patient is very argumentative both with my staff and me today.  7.)  Is very obvious to me that he's going to do whatever he would like despite recommendations.  His wife is with him here in the office today and verbally criticize does attitude and lack of compliance with postoperative directions.    OBJECTIVE:     1.)  Incision intact.  Steri-Strips applied for reinforcement.  2.)  Swelling minimal.  3.)  Did not do range of motion exercises today, usually start at 2 weeks postoperative.  I'm not confident that dictating him on those will result in him actually performing them based on his approach thus far.  4.)  No evidence of infection and no active bleeding.  5.)  Intraoperatively he had a lot of synovitis scar tissue a loose body and arthrosis of the dorsal aspect of the cartilage.    ASSESSMENT:  (Z91.19) Noncompliance  (primary encounter diagnosis)  (M20.21) Hallux rigidus of right foot  (M19.071) Arthrosis of right foot    PLAN:      1.)  In 21 years I have never seen a patient ignoring and argue against postoperative instructions as Ronnie Mason is doing today.  Completely inappropriate.  I told him so.  2.)  Recommendations are for strict nonweightbearing  with 2 crutches or scooter.  He should not be walking on the foot in a splint boot or a walking boot at this stage.  3.)  He should also not take his dressings off and today I placed Betadine on the incisions, Steri-Strips for reinforcement, a new Kerlix wrap covered with an Ace wrap and I tape this up with athletic tape, 1 inch over 4 separate areas on the foot and ankle.  I told him that the application of the tape would prevent this from coming off "on its own".  He would have to physically remove these 4 separate strands of athletic tape to do so.  He told me "that is what scissors are for".  Unbelievably disrespectful with complete disregard for postoperative instructions.  4.)  He did have significant arthrosis of the great toe joint intraoperatively and it would not surprise me if he has persistent symptoms with this longer term.  5.)  With the level of noncompliance and disregard for postoperative instructions as well as his argumentative attitude towards any suggestions I make I would not undertake further surgery with him with arthrodesis or otherwise.  Hopefully he'll improve his noncompliance and his social interaction in upcoming weeks.    Miguel RotaBrian D. Hanzel Pizzo, DPM  [facility]    Portions of this document were  created with voice-recognition software and may contain occasional errant or misspelled words.

## 2017-11-03 ENCOUNTER — Ambulatory Visit (INDEPENDENT_AMBULATORY_CARE_PROVIDER_SITE_OTHER): Payer: PPO | Admitting: Podiatrist

## 2017-11-03 ENCOUNTER — Encounter (INDEPENDENT_AMBULATORY_CARE_PROVIDER_SITE_OTHER): Payer: Self-pay | Admitting: Podiatrist

## 2017-11-03 DIAGNOSIS — M2021 Hallux rigidus, right foot: Secondary | ICD-10-CM

## 2017-11-03 NOTE — Progress Notes (Signed)
Ronnie Mason Ronnie Mason  PCP:  Shann MedalKraft, Ronnie Viseskul, MD    SUBJECTIVE:      Review of patient's allergies indicates:  Allergies   Allergen Reactions    Adhesives Rash    Bee Venom Hives, Itching and Swelling    Gabapentin      Flu like symptoms, diarrhea, body ache upset stomach       has a current medication list which includes the following prescription(s): acetaminophen, amlodipine, atorvastatin calcium, vitamin d3, clobetasol, cyanocobalamin, diclofenac, diclofenac, duloxetine hcl, duloxetine hcl, epinephrine, lidocaine, lisinopril, methocarbamol, naproxen sodium, pregabalin, pregabalin, primidone, thiamine hcl, and triamcinolone.    1.)  2 weeks postoperative right first metatarsal phalangeal joint cleanup for loose body and arthrosis.  2.)  See my note from postoperative week 1, patient was very noncompliant and antagonistic to all postoperative instructions.  3.)  Today when I come in the room he has been nonweightbearing with the scooter and his dressing was intact.  He is apologetic for his approach at our visit last week.  Apology accepted.  4.)  Relates doing pretty well this week.  5.)  His wife is with him and takes notes.  6.)  In talking with him he does seem a bit sedated and I wonder if he's taken some medication to keep him a bit more calm.    OBJECTIVE:     1.)  Incisions well-healed.  Minimal swelling.  Sutures removed see below.  2.)  He's able to tolerate dorsiflexion and plantarflexion range of motion.    ASSESSMENT:  (M20.21) Hallux rigidus, right foot  (primary encounter diagnosis)    PLAN:      1.)  Sutures removed.  Betadine on the incision site.  Steri-Strips applied.  He'll keep dry for 4 more days.    2.)  He can transition from nonweightbearing to weightbearing in a walking boot.  No shoes just yet.  Follow-up in 2 weeks.  3.)  Started range of motion exercises.  He demonstrated good technique.  4.)  Shared his intraoperative pictures with him showing him the cartilage loss in his great  toe joint.  He does have fairly advanced arthrosis here and he could need arthrodesis in the future at the cleanup fails to relieve symptoms long-term.  I still have some concerns with his ability to stay nonweightbearing after a procedure like that.  He's much more compliant today than last weeks will have to see how things go in the future with a cautious approach to anymore surgery depending on his compliance level.      Ronnie RotaBrian D. Mason Cordts, DPM  [facility]    Portions of this document were created with voice-recognition software and may contain occasional errant or misspelled words.

## 2017-11-17 ENCOUNTER — Ambulatory Visit (INDEPENDENT_AMBULATORY_CARE_PROVIDER_SITE_OTHER): Payer: PPO | Admitting: Podiatrist

## 2017-11-17 ENCOUNTER — Encounter (INDEPENDENT_AMBULATORY_CARE_PROVIDER_SITE_OTHER): Payer: Self-pay | Admitting: Podiatrist

## 2017-11-17 VITALS — BP 120/100

## 2017-11-17 DIAGNOSIS — M2021 Hallux rigidus, right foot: Secondary | ICD-10-CM

## 2017-11-17 NOTE — Progress Notes (Signed)
Ronnie Mason  PCP:  Shann MedalKraft, Vara Viseskul, MD    SUBJECTIVE:      Review of patient's allergies indicates:  Allergies   Allergen Reactions    Adhesives Rash    Bee Venom Hives, Itching and Swelling    Gabapentin      Flu like symptoms, diarrhea, body ache upset stomach       has a current medication list which includes the following prescription(s): acetaminophen, amlodipine, atorvastatin calcium, vitamin d3, clobetasol, cyanocobalamin, diclofenac, diclofenac, duloxetine hcl, duloxetine hcl, epinephrine, lidocaine, lisinopril, methocarbamol, naproxen sodium, pregabalin, pregabalin, primidone, thiamine hcl, and triamcinolone.    1.)  3.5 weeks postoperative.  2.)  Comes in using the scooter and a crutch but is mostly been walking in a walking boot and the blue splint boot.  3.)  Have recommended walking in the black walking boot which has a rocker sole versus the blue splint boot which is just protected while he was nonweightbearing.  He's been walking more than the blue splint it sounds like.  4.)  Indications otherwise he did try as Hoka shoes for a couple hours this last couple of days and did fine.  5.)  Has been doing range of motion exercises.  6.)  Not having any of the pain he had preoperatively.      OBJECTIVE:     1.)  Edema is consistent with the postoperative timeframe.  Some mild inflammation around the incision.  2.)  Range of motion is limited as expected due to the arthrosis in the joint.  About 25 of dorsiflexion and 20 plantar.  3.)  During range of motion exam I felt one mild catch in the joint but otherwise it felt smooth.  He did not relate any pain with this.  4.)  Incision is well-healed without hypersensitivity.    ASSESSMENT:  (M20.21) Hallux rigidus, right foot  (primary encounter diagnosis)    PLAN:      1.)  Use the black walking boot rather than the blue splint boot.  2.)  Start transitioning from the walking boot into shoes gradually.  The Hoka shoes are excellent.  Two-hour  rule.  Reviewed this with he and his wife who took notes.  3.)  Follow-up in 4 weeks.  That'll be 8 weeks postoperative.  4.)  Activity should be basic daily activities for now no excessive walking standing etc.      Miguel RotaBrian D. Tex Conroy, DPM  [facility]    Portions of this document were created with voice-recognition software and may contain occasional errant or misspelled words.

## 2017-11-22 ENCOUNTER — Encounter (HOSPITAL_BASED_OUTPATIENT_CLINIC_OR_DEPARTMENT_OTHER): Payer: Self-pay | Admitting: Physical Medicine & Rehabilitation

## 2017-11-22 NOTE — Telephone Encounter (Signed)
Dr. [name],    Patient last seen on 09/30/2017 for lumbar radiculopathy.  Scheduled to follow up on 11/25/2017.    ----------------------------------------------------------------------------------------------------------------------    eCare message from patient:        11/22/17 11:26 AM   Good morning Dr. [name],   This consultation is not to discuss the epidural injections you did in Feb. They have worked great. Thank you.     I would like to discuss my foot surgery that Dr. Argie Ramming did on March 21st and how it is affecting my right hip. The foot has healed very well and he put me back in shoes last week. However due to "gimping" around for 3 months it has taken a toll on my right hip and is causing a frequent stabbing pain in the hip joint. It has not made me fall yet but I am using a cane for stability. Maybe when my gate gets back to normal it will get better, but that may take some time.     Unless I hear from you before Thursday I will see you at 9:00am.   Thanks much,   Ronnie Mason   [phone]       ----------------------------------------------------------------------------------------------------------------------    Plan from last visit:    At this point, it sounds as though they've has been getting about 3-4 months of relief from the epidural steroid injections that we have performed.  I spent some time discussing that we would limit these injections to no more than 3 in a 12 month period due to concern for cumulative doses of steroid.  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  He has a few other musculoskeletal conditions including left knee osteoarthritis and right hallux rigidus that is continuing to likely altered biomechanics and loading.      - I am okay with repeating bilateral transforaminal epidural steroid injections in the future but hold for at least  a 4-6 month period between this last injection and another injection.  He will call me should his symptoms return and we can set him up directly for bilateral L5 TFESIs.   - He is continuing with physical therapy with Drue Second at [facility]  - He notes that exercises given to him by Dr. Marcelle Overlie aggravate his back and that when he is doing cycling for his left knee range of motion is worse since his back pain.  I suggested that he try deep water running which helps with the buoyancy to take pressure off the back but allows him to do range of motion for the knee  - He will continue to take medications as prescribed by his primary care physician including methocarbamol and also diclofenac from Dr. Marcelle Overlie.  I spent some time explaining what both of these medications do from a [place] extent point.  He is also on duloxetine and Lyrica.  I encouraged him to discuss with Dr. Marisa Sprinkles his current usage of methocarbamol so that she can update the prescription to reflect what is actually taking, which is 500 mg twice a day        Total time of 25 minutes was spent facet-to-face with the patient, of which, more than 50% was spent counseling and coordinating the patient's care as outlined above    ----------------------------------------------------------------------------------------------------------------------    Please review eCare message and advise.    Thanks,  Ronnie DerryShacole Glenwood Revoir, MA  [facility]

## 2017-11-23 NOTE — Progress Notes (Signed)
CC: New issue RIGHT hip pain    HPI:  Ronnie Mason is a 67 y/o man I last saw in clinc on 09-30-17 in follow up after bilatearl L5 TFESIs performed on 09-15-17. He notes that his back and leg pain are doing well still.     His main reason for today's visit is for RIGHT hip pain  He has been seeing Dr. Bari MantisBrian McInnis for his right hallux rigius and had surgery 10-21-17.   He notes in the past 6 weeks she has began developing right hip pain.  He localizes the pain to the right greater trochanter/lateral hip, worse with walking and with sleeping on his right hip.  Rates pain 8 out of 10, sharp with a feeling of instability and catching.  Denies any radiation of pain down his leg.    He has had no recent imaging of his hip but does have x-rays from 2017 after a fall.  Has had no formal treatment for this recently but in the past he has had an injection to the right intertrochanter without image guidance by Dr. Araceli BouchePeperzak.     Past medical, past surgical, social, family hx: Reviewed with patient and is: As above in HPI    Medications:  Reviewed  In the chart.     ROS:  A complete review of systems is negative except for what is documented in the HPI above and what is noted here:  No changes since last visit.     Physical Exam  Gen: In no acute distress.  Psych: Normal mood.  HEENT: EOMI. Normal cephalic, atruamatic  CVS: No clubbing, cyanosis, edema  Pulm: Non-labored respirations  Skin: Moist, no lesions  Neuro: Gait is non-ataxic. Strength 5/5 right hip flexion, knee extension  MSK:   Tender to palpation over the right greater trochanter.  No tenderness over the left greater trochanter  Stiffness with deep hip flexion and internal rotation of the right hip but does not reproduce his typical pain    Imaging/Diagnostic studies:  X-ray LEFT hip (but shows AP standing) 01-11-17  - Evidence narrowing RIGHT hip joint compared to left c/w OA        Impression:  67 y/o with RIGHT lateral hip pain c/w  1) right greater trochanteric pain  syndrome  2) osteoarthritis    Discussion/Plan:   Ronnie Mason's current lateral hip pain is most consistent with greater trochanteric pain syndrome.  We discussed the pathophysiology of this being more of a tendinopathy rather than a true bursitis.  Clearly with his recent foot surgery he likely has altered some gait mechanics and had some stress on the area.  While he does have evidence of osteoarthritis, I do not get a good sense of this is the main driver of his current pain but it may be a contributing factor.     Discussed non operative management including physical therapy, medications, injections, protected weight bearing, analgesics and injections.  It is noted that he had a right greater trochanteric bursa steroid injection on 04-09-17 with Dr. Arac[facility] which he notes gave him good relief.     - Discussed the risk and benefits and pros and cons of a right greater trochanteric bursa steroid injection.  Ultimately we decided to move forward with this.  Super positional below  - Referral for physical therapy focused on strengthening of the gluteal muscles, hip abductors..   - In the future if we consider the pain to be coming from the arthritis, we could consider  performing an  intra-articular hip joint injection would do this with fluoro guidance.   - Follow up with me as needed.    Total time of 25 minutes was spent facet-to-face with the patient, of which, more than 50% was spent counseling and coordinating the patient's care as outlined above        Park Liter, MD  Attending Physician  [facility]  [facility] at [facility]

## 2017-11-25 ENCOUNTER — Ambulatory Visit: Payer: PPO | Attending: Physical Medicine & Rehabilitation | Admitting: Physical Medicine & Rehabilitation

## 2017-11-25 ENCOUNTER — Encounter (HOSPITAL_BASED_OUTPATIENT_CLINIC_OR_DEPARTMENT_OTHER): Payer: Self-pay | Admitting: Physical Medicine & Rehabilitation

## 2017-11-25 VITALS — BP 136/84 | HR 93 | Temp 96.8°F | Resp 16

## 2017-11-25 DIAGNOSIS — M25551 Pain in right hip: Secondary | ICD-10-CM | POA: Insufficient documentation

## 2017-11-25 DIAGNOSIS — M1611 Unilateral primary osteoarthritis, right hip: Secondary | ICD-10-CM | POA: Insufficient documentation

## 2017-11-25 MED ORDER — TRIAMCINOLONE ACETONIDE 40 MG/ML IJ SUSP
40.0000 mg | Freq: Once | INTRAMUSCULAR | Status: AC
Start: 2017-11-25 — End: 2017-11-25
  Administered 2017-11-25: 40 mg via INTRALESIONAL

## 2017-11-25 NOTE — Patient Instructions (Signed)
Procedure you had today:  RIGHT Ultrasound guided greater trochanteric bursa injection.     Plan  - You may have slight increase in pain today and tomorrow as the local anesthetic wears off  -With steroid, some patients may experience headache and facial flushing and a feeling that they have had "too much caffeine"  - Ice the injection site 2-3 times today as needed for pain, 15-20 minutes at a time  - Do not soak/submerge the area in water for 3 days to prevent infection.   - Steroid medication takes anywhere from 2 days to 2 weeks to take effect  - Monitor for signs for signs of bleeding, infection (redness, warmth, swelling)  -Call our office at [phone] [facility](Press option 8)  if you have any questions.   - Follow up as neeed.     Park LiterBrian Deval Mroczka, MD  Attending Physician  [facility]  [facility] at [facility]

## 2017-12-01 ENCOUNTER — Ambulatory Visit (HOSPITAL_BASED_OUTPATIENT_CLINIC_OR_DEPARTMENT_OTHER): Payer: PPO

## 2017-12-01 ENCOUNTER — Telehealth (INDEPENDENT_AMBULATORY_CARE_PROVIDER_SITE_OTHER): Payer: Self-pay | Admitting: Internal Medicine

## 2017-12-01 ENCOUNTER — Encounter (INDEPENDENT_AMBULATORY_CARE_PROVIDER_SITE_OTHER): Payer: PPO | Admitting: Podiatrist

## 2017-12-01 NOTE — Telephone Encounter (Signed)
Did you discuss changing pregamlin dose with pt?

## 2017-12-01 NOTE — Telephone Encounter (Signed)
Happy to see him in clinic  Has not seen him in 6 wks

## 2017-12-01 NOTE — Telephone Encounter (Signed)
Ronnie Mason thought Dr. Marisa Sprinkles was increasing the Pregabalin dosage, but it doesn't appear that change was made on Rx.     Please call him to discuss.     He is on his way to [facility], and can be reached at two different numbers:  Mobile: [name] #: [phone]    If you can't reach him by cell, please call the [facility] number, as cell service is poor at his property.     Thank you!

## 2017-12-02 NOTE — Telephone Encounter (Signed)
Patient wife calling in Lyrica was increased to a total of 125 MG and (patient taking 1 100 MG tablet and a 25 MG tablet daily) somewhere along the line they did not re-order the 100 MG and patient  was just taking (2) 25 MG which is only 50 MG and is now complaining his foot hurts. [place] is on [place]-until the weekend. I informed wife that appointment was requested per PCP. She did agree and patient is now scheduled for 12/08/17 at 1:40 PM. Wife seems to think maybe Dr. Marisa Sprinkles can respond via [facility] [place] with instructions on what patient can do in mean time and or if this appnt if needed. Again informed wife appnt is requested and medication adjustment is done in clinic not over the phone. Routing to PCP as I informed wife I would.

## 2017-12-07 ENCOUNTER — Telehealth (INDEPENDENT_AMBULATORY_CARE_PROVIDER_SITE_OTHER): Payer: Self-pay | Admitting: Internal Medicine

## 2017-12-07 DIAGNOSIS — G609 Hereditary and idiopathic neuropathy, unspecified: Secondary | ICD-10-CM

## 2017-12-08 ENCOUNTER — Encounter (INDEPENDENT_AMBULATORY_CARE_PROVIDER_SITE_OTHER): Payer: PPO | Admitting: Internal Medicine

## 2017-12-08 MED ORDER — PREGABALIN 100 MG OR CAPS
100.0000 mg | ORAL_CAPSULE | Freq: Two times a day (BID) | ORAL | 1 refills | Status: DC
Start: 2017-12-08 — End: 2018-03-09

## 2017-12-08 MED ORDER — PREGABALIN 100 MG OR CAPS
100.0000 mg | ORAL_CAPSULE | Freq: Two times a day (BID) | ORAL | 1 refills | Status: DC
Start: 2017-12-08 — End: 2017-12-08

## 2017-12-08 MED ORDER — PREGABALIN 25 MG OR CAPS
ORAL_CAPSULE | ORAL | 1 refills | Status: DC
Start: 2017-12-08 — End: 2017-12-17

## 2017-12-08 NOTE — Telephone Encounter (Signed)
This is a controlled substance  Make sure this is updated for contract and doh disposal info    Prescription must pick up

## 2017-12-08 NOTE — Telephone Encounter (Signed)
Done

## 2017-12-09 NOTE — Telephone Encounter (Signed)
Checked wife's ID. Patient will be in next week. The substance contact needs to be signed by the patient. Wife cannot sign and since she picked up I did not have her sign. Informed this will be done in person with patient and PCP.

## 2017-12-14 ENCOUNTER — Encounter (INDEPENDENT_AMBULATORY_CARE_PROVIDER_SITE_OTHER): Payer: PPO | Admitting: Podiatrist

## 2017-12-15 ENCOUNTER — Encounter (INDEPENDENT_AMBULATORY_CARE_PROVIDER_SITE_OTHER): Payer: PPO | Admitting: Podiatrist

## 2017-12-15 NOTE — Progress Notes (Signed)
Ronnie Mason is a 67 year old male       BP 110/80    Pulse 91    Temp 97.9 F (36.6 C) (Oral)    Resp 16    Ht 6' 0.5" (1.842 m)    Wt 214 lb (97.1 kg)    SpO2 98%    BMI 28.62 kg/m   Chief Complaint   Patient presents with    Medication Management     pt would like to discuss adjusting Lyrica.     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    Diagnoses and all orders for this visit:    Idiopathic peripheral neuropathy  -     Pregabalin 25 MG Oral Cap; Will be at goal of 125 bid , by increasing by 50 per day per week until total 125 total    Intraparenchymal hemorrhage of brain (HCC)    Hyperlipidemia, unspecified hyperlipidemia type    Vitamin D deficiency    Essential hypertension    Chronic otitis externa of right ear, unspecified type  Comments:  use hydrocortisone  then triamcinolone  clobetasol ok if failed other but use spairing ly   Orders:  -     triamcinolone 0.1 % External Cream; As needed to right ear bid x 1 week    History of alcohol dependence (HCC)      Somewhere along the line since our last visit he did not fill the 100 mg of Lyrica and he's only been taking 25 mg of Lyrica twice a day  According to his wife it's not clear how long he's been doing this  He is now having pain in his foot  And it's slightly different than the history of the right foot surgery that he recently had  It's very hard to tell    The right toe appears to be healing slightly more red slightly more swollen but not infected    He has also been taking diclofenac from Dr. Marcelle Overlie  In the past she was  taking this for his left knee he has no GI side effect    Recently he's felt a little bit dizzy it is really not clear in what setting and he fell  However it might have been in the setting that he's had some alcohol    He is to stop alcohol 100%  I reviewed with him that he has a history of excessive alcohol  And I don't think he does handle it very well  That I want him to live and I don't him to hit his head again    As for the Lyrica were going to double it for about a week and then double it again eventually reaching the goal of 125 mg twice a day and if he has increasing confusion or dizziness by this medication he is to back off this dose    Currently he has a slight cut over his left forehead he has no signs of concussion    But anybody who is drinking when he's been told he should not has an alcohol problem  Refilled his steroid cream for him  Blood pressure is not a problem continue    History of other issues including hypertension and past history of intraparenchymal hemorrhage of the brain  Currently he is not having any headaches  If symptoms escalate he can go to the emergency room     No follow-ups on file.  Health Maintainace reviewed with patient today that are due  Health Maintenance   Topic Date Due    Zoster Vaccine (2 of 3) 09/03/2018 (Originally 07/17/2013)    Pneumococcal Vaccine (2 of 2 - PPSV23) 09/17/2018    Lipid Disorders Screening  10/13/2018    Depression Screening (PHQ-2)  12/18/2018    Colorectal Cancer Screening (Colonoscopy)  09/24/2020    Diabetes Screening  10/12/2020    Tetanus Vaccine  11/29/2025    Influenza Vaccine  Completed    Hepatitis C Screening  Addressed    HIV Screening  Addressed    gad /phq9 /pain tracker     Patient   express understanding of care plan/medications  Created with the assistance of voice recognition software dictation    Risk of taking medication properly and follow up discussed especially to call if problem  MEDICATION as of  Discharge  Current Outpatient Medications   Medication Sig Dispense Refill    Acetaminophen 500 MG Oral Tab 2 tablets bid      amLODIPine 5 MG Oral Tab Take 1 tablet (5 mg) by mouth 2 times a day. 90 tablet 1    Atorvastatin Calcium 10 MG Oral Tab Take 1 tablet (10 mg) by mouth daily. 90 tablet 2    Cholecalciferol (VITAMIN D3) 2000 units Oral Cap Take 1 capsule (2,000 Units) by mouth daily. For low level 1 capsule 1    Clobetasol Propionate 0.05 % External Ointment Small amount to rash as needed bid for 1-2 weeks at a time 15 g 1    Cyanocobalamin 1000 MCG Oral Tab one per day over-the-counter started 5/21 /2012 this level borderline low 1 Tab 1    DICLOFENAC OR Take 75 mg by mouth.      Diclofenac Sodium (VOLTAREN) 1 % Transdermal Gel Apply 2 g topically 4 times a day. Apply to neck, trapezius muscle, and low back. No more than 2g applied at a time. 1 Tube 5    DULoxetine HCl 30 MG Oral CAPSULE ENTERIC COATED PARTICLES Take 1 capsule (30 mg) by mouth daily. 90 capsule 1    DULoxetine HCl 60 MG Oral CAPSULE ENTERIC COATED PARTICLES Take 1 capsule (60 mg) by mouth daily. 90 capsule 1    EPINEPHrine 0.3 MG/0.3ML Injection Solution Auto-injector Inject as instructed per patient package insert, 0.3 mg intramuscularly or subcutaneously into the thigh, if needed to treat anaphylaxis 3 each 1    Lidocaine 5 % External Patch Apply 1 patch onto the skin daily. Apply to painful area for up to 12 hours in a 24 hour period. 30 patch 10    Lisinopril 20 MG Oral Tab Take 1 tablet (20 mg) by mouth every 12 hours. 180 tablet 3    Methocarbamol 500 MG Oral Tab one at night for leg spasm may take 1/2 to one in am if not driving (Patient not taking: Reported on 12/17/2017) 60 tablet 3    Pregabalin 100 MG Oral Cap Take 1 capsule (100 mg) by mouth 2 times a day. (Patient not taking: Reported on 12/17/2017) 180 capsule 1    Pregabalin 25 MG Oral Cap Will be at  goal of 125 bid , by increasing by 50 per day per week until  total 125 total 180 capsule 1    Primidone 50 MG Oral Tab Take 1 tablet (50 mg) by mouth 2 times a day. 180 tablet 0    THIAMINE HCL OR       triamcinolone 0.1 % External Cream As needed to right ear bid x 1 week 1 Tube 0     No current facility-administered medications for this visit.         ________________________  Start of visit   Jahzier Villalon is a 67 year old male                   BP 110/80    Pulse 91    Temp 97.9 F (36.6 C) (Oral)    Resp 16    Ht 6' 0.5" (1.842 m)    Wt 214 lb (97.1 kg)    SpO2 98%    BMI 28.62 kg/m   Chief Complaint   Patient presents with    Medication Management     pt would like to discuss adjusting Lyrica.     HPI  History of:    pcp [name], MD reviewed last visit  10/2017    Neuropathy- on lyrica - confusion w med dose as he forgot the new dose increase and we had to write new script   Past brain trauma   hyperlipid  htn   Vit d def   past alcohol consumption excessive      Patient Care Team:  [name], MD as PCP - General (Internal Medicine)  [name], MD (Internal Medicine)  Jeanella Craze., Bryan Lemma, MD as Cardiologist  (Cardiology)  Geri Seminole, MD (Sports Medicine)  Holli Humbles, DPM (Podiatry)  Dixon Boos, MD (Orthopedic Surgery)       Today follow-up above issues:     On the lyrica - they messed up - not sure how long at 25 twice a day instead of 125 twice a day   He has not yet increased the dose   Last night he fell don't know exactly why don't know it was dizzy and hit his head   In further discussion he had been having a couple of alcoholic drinks a night      Orders Only on 10/12/17   1. Lipid Panel   Result Value Ref Range    Cholesterol (Total) 240 (H) <200 mg/dL    Triglyceride 604 (H) <150 mg/dL    Cholesterol (HDL) 540 >39 mg/dL    Cholesterol (LDL) 71 <130 mg/dL    Non-HDL Cholesterol 121 0 - 159 mg/dL    Cholesterol/HDL Ratio 2.0     Lipid Panel, Additional Info. (NOTE)    2.  Comprehensive Metabolic Panel   Result Value Ref Range    Sodium 138 135 - 145 meq/L    Potassium 4.1 3.6 - 5.2 meq/L    Chloride 101 98 - 108 meq/L    Carbon Dioxide, Total 27 22 - 32 meq/L    Anion Gap 10 4 - 12    Glucose 117 62 - 125 mg/dL    Urea Nitrogen 20 8 - 21 mg/dL    Creatinine 9.81 1.91 - 1.18 mg/dL    Protein (Total) 6.9 6.0 - 8.2 g/dL    Albumin 4.5 3.5 - 5.2 g/dL    Bilirubin (Total) 0.6 0.2 - 1.3 mg/dL  Calcium 9.4 8.9 - 10.2 mg/dL    AST (GOT) 33 9 - 38 U/L    Alkaline Phosphatase (Total) 91 36 - 161 U/L    ALT (GPT) 32 10 - 48 U/L    GFR, Calc, European American >60 >59 mL/min/[1.73_m2]    GFR, Calc, African American >60 >59 mL/min/[1.73_m2]    GFR, Information       Calculated GFR in mL/min/1.73 m2 by MDRD equation.  Inaccurate with changing renal function.  See http://[email or web address]   3. Hemoglobin A1c   Result Value Ref Range    Hemoglobin A1C 5.3 4.0 - 6.0 %   4. Lab Add On Order, Non-Micro   Result Value Ref Range    Lab Test Requested ths free t4     Specimen Type/Description Blood     Sample To Use Most recent     Test Request Status Order Processed    5. Free T4   Result Value Ref Range    Thyroxine (Free) 0.7 0.6 - 1.2 ng/dL   6. Thyroid Stimulating Hormone   Result Value Ref Range    Thyroid Stimulating Hormone 0.936 0.400 - 5.000 u[IU]/mL       MEDICATION PRIOR TO VISIT  Reports   Outpatient Medications Prior to Visit   Medication Sig Dispense Refill    Acetaminophen 500 MG Oral Tab 2 tablets bid      amLODIPine 5 MG Oral Tab Take 1 tablet (5 mg) by mouth 2 times a day. 90 tablet 1    Atorvastatin Calcium 10 MG Oral Tab Take 1 tablet (10 mg) by mouth daily. 90 tablet 2    Cholecalciferol (VITAMIN D3) 2000 units Oral Cap Take 1 capsule (2,000 Units) by mouth daily. For low level 1 capsule 1    Clobetasol Propionate 0.05 % External Ointment Small amount to rash as needed bid for 1-2 weeks at a time 15 g 1    Cyanocobalamin 1000 MCG Oral Tab one  per day over-the-counter started 5/21 /2012 this level borderline low 1 Tab 1    DICLOFENAC OR Take 75 mg by mouth.      Diclofenac Sodium (VOLTAREN) 1 % Transdermal Gel Apply 2 g topically 4 times a day. Apply to neck, trapezius muscle, and low back. No more than 2g applied at a time. 1 Tube 5    DULoxetine HCl 30 MG Oral CAPSULE ENTERIC COATED PARTICLES Take 1 capsule (30 mg) by mouth daily. 90 capsule 1    DULoxetine HCl 60 MG Oral CAPSULE ENTERIC COATED PARTICLES Take 1 capsule (60 mg) by mouth daily. 90 capsule 1    EPINEPHrine 0.3 MG/0.3ML Injection Solution Auto-injector Inject as instructed per patient package insert, 0.3 mg intramuscularly or subcutaneously into the thigh, if needed to treat anaphylaxis 3 each 1    Lidocaine 5 % External Patch Apply 1 patch onto the skin daily. Apply to painful area for up to 12 hours in a 24 hour period. 30 patch 10    Lisinopril 20 MG Oral Tab Take 1 tablet (20 mg) by mouth every 12 hours. 180 tablet 3    Methocarbamol 500 MG Oral Tab one at night for leg spasm may take 1/2 to one in am if not driving (Patient not taking: Reported on 12/17/2017) 60 tablet 3    Naproxen Sodium 220 MG Oral Cap Take 2 capsules (440 mg) by mouth every 12 hours as needed. (Patient not taking: Reported on 12/17/2017) 1 capsule 1    Pregabalin 100 MG  Oral Cap Take 1 capsule (100 mg) by mouth 2 times a day. (Patient not taking: Reported on 12/17/2017) 180 capsule 1    Pregabalin 25 MG Oral Cap 1  po bid 05/21/2017 180 capsule 1    Primidone 50 MG Oral Tab Take 1 tablet (50 mg) by mouth 2 times a day. 180 tablet 0    THIAMINE HCL OR       Triamcinolone Acetonide 0.1 % External Cream As needed to right ear bid x 1 week 1 Tube 0     No facility-administered medications prior to visit.        REVIEW OF SYSTEMS - are otherwise negative unless as noted in HPI above or as additional issues below  Constitutional:     Eyes:    Head ears, nose mouth throat:   Cardiovascular: There is no clear  palpitations   Respiratory:   Gastrointestinal:   Genitourinary:    Musculoskeletal:    Integumentary:   Neurological:   Psychiatric/Behavioral:     Endocrine:   Hematological:   Allergic/Immunologic:     PHYSICAL EXAM  BP 110/80    Pulse 91    Temp 97.9 F (36.6 C) (Oral)    Resp 16    Ht 6' 0.5" (1.842 m)    Wt 214 lb (97.1 kg)    SpO2 98%    BMI 28.62 kg/m   Well Dressed   He has a 1 inch slight scar that is healing well over his left forehead    Constitution, In general looks:   well , good coloring  No diaphoresis  Patient is Alert  & Attentive  Patient is conversive    Psychiatric Mental Status / Neurologic Mental Status:  Affect:  Pleasant calm    He is always slightly short tempered or rather no patient's ever since his fall previously and that is at baseline    Eyes: no discharge, no gross puffiness, non- icteric    Neck:    Thyroid not enlarged  2+ carotid pulses no bruits  Lymph nodes:  No gross cervical swelling  No gross axillary swelling    Cardiovascular:   Heart Regular rate , rhythm, no murmur  No edema  Respiratory:   Not tachyphpnic  Clear to ascultation  No rales, rhonchi , without increased effort or stridor  No clubbing     Abdomen / Gastrointestinal:  Soft. No masses. No guarding. No rebound  No gross bulge or hernia. Abdomen, groin  No bruit or pulsatile mass  Genitourinary:  No suprapubic tenderness  No increased flank pain with percussion    Neurologic:   Grossly intact  Memory:   Attention       Cranial Nerve    Eyes pupil and lid are intact   II, III, IV, VIII   Facial Movement VII: grossly intact, no facial droop     Motor- Gait-coordination: get up and go normal, no gross weakness   Gait- base normal appropriate for age   Get up and go -no problem        Musculoskeletal:  No gross abnormal movement, twitch   No gross atrophy or deformity    Skin:Left for head drying up laceration well healed   Hematologic/Lymphatic /Immunologic:  No gross bruising or rash or hives      ++++++++++++++++++++++++++++++++++++++++++++++++++  Allergy  Review of patient's allergies indicates:  Allergies   Allergen Reactions    Adhesives Rash    Bee Venom Hives, Itching and Swelling  Gabapentin      Flu like symptoms, diarrhea, body ache upset stomach       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]     URI (upper respiratory infection) [J06.9]     Greater trochanteric pain syndrome of right lower extremity [M25.551] 11/25/2017    Right lumbar radiculitis [M54.16] 05/05/2017    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] 01/11/2017     Osteoarthritis of right hip x ray report - moderate to advanced radiology read 01/11/2017      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] 08/06/2016    Impaired mobility and ADLs [Z74.09] 04/08/2016    Difficulty in walking, not elsewhere classified [R26.2] 03/12/2016    Vitamin D deficiency [E55.9] 02/13/2016    Balance problem [R26.89] 01/23/2016    Chronic pain of both shoulders [M25.511, G89.29, M25.512] 01/23/2016     X ray arthritis  02/19/2016        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) [G91.2] 01/15/2016    Intraparenchymal hemorrhage of brain (HCC) [I61.9] 01/06/2016    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, 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, 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] 01/06/2016    Essential hypertension [I10] 12/16/2015    Essential tremor [G25.0] 12/16/2015    Hx of spontan intraparenchymal intracran bleed assoc with hypertension [Z86.79] 12/16/2015     Herrin -Cotati 4/29- 12/10/2015  Non-traumatic intraparenchymal hemorrhage, hypertensive of the left thalamus   Traumatic hemorrhage   -- falcine subdural hemorrhage, small cortical subarachnoid      Alcohol abuse [F10.10] 12/16/2015    Cerebral ventriculomegaly [G93.89]  10/09/2015    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    History of alcohol dependence (HCC) [F10.21] 06/26/2015    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) [G37.9] 05/07/2015    Idiopathic peripheral neuropathy [G60.9] 03/15/2015    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] 05/28/2014    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] 01/18/2014    Tremor [R25.1] 04/20/2013    Fall [W19.XXXA] 02/23/2013     BP < 100  Occasioanal tri[p and leg weakness      Back pain, lumbosacral [M54.5] 02/15/2013    Lumbar radiculopathy, chronic [M54.16] 02/15/2013    Neck pain [M54.2] 02/15/2013    Chronic pain [G89.29] 12/21/2012     Now at [facility], DO, Santiago Glad    Neck pain -burn c3-7 on left side per patient  Some trigger injection    Lumbar pain-since 80's better with exercise      Bee sting allergy [Z91.030] 12/21/2012     hives      Numbness and tingling of leg [R20.0, R20.2] 12/21/2012     Left calf -left foot long term suspect from back-suspect L 45      Chronic back pain [M54.9, G89.29] 12/21/2012     Mri in mid scape 1/214  Multilevel degenerative disc disease of the lumbar spine. Circumferential disc bulge at all levels below and including L1-2. No significant central spinal canal stenosis or neuroforaminal narrowing.  2. Mild retrolisthesis of L5 on S1.            B12 deficiency [E53.8] 12/21/2012     Borderline low 5 /21/2014      Chronic renal insufficiency, stage III (moderate) (HCC) [N18.3] 12/21/2012     5/ 2013      Hyperlipidemia [E78.5] 09/26/2012  Cervicalgia [M54.2] 03/11/2010     Radiofrequency ablation of c3 to c 7           Past Medical History:   Diagnosis Date    Carotid Sinus Hypersensitivity     Disc disorder of lumbosacral region     Hip injury     HYPERTENSION      HYPOTENSION      Intraparenchymal hemorrhage of brain (HCC) 2017    Pain of right hip joint 01/23/2016    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 myelopathy or radiculopathy  05/28/2014    Syncope     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(HCC)     URI (upper respiratory infection)        Social History     Socioeconomic History    Marital status: Married     Spouse name: Not on file    Number of children: Not on file    Years of education: Not on file    Highest education level: Not on file   Social Needs    Financial resource strain: Not on file    Food insecurity - worry: Not on file    Food insecurity - inability: Not on file    Transportation needs - medical: Not on file    Transportation needs - non-medical: Not on file   Occupational History    Not on file   Tobacco Use    Smoking status: Never Smoker    Smokeless tobacco: Former Estate agent and Sexual Activity    Alcohol use: Yes    Drug use: No    Sexual activity: Not on file   Other Topics Concern    Not on file   Social History Narrative    ** Merged History Encounter **              Family History     Problem (# of Occurrences) Relation (Name,Age of Onset)    Colon Cancer (1) Father    Heart (other) (1) Mother: MI    Other Family Hx (1) Other: myelodysplasia          Social History     Socioeconomic History    Marital status: Married     Spouse name: Not on file    Number of children: Not on file    Years of education: Not on file    Highest education level: Not on file   Social Needs    Financial resource strain: Not on file    Food insecurity - worry: Not on file    Food insecurity - inability: Not on file    Transportation needs - medical: Not on file    Transportation needs - non-medical: Not on file   Occupational History    Not on file   Tobacco Use    Smoking status: Never Smoker    Smokeless tobacco: Former Estate agent and Sexual Activity    Alcohol use: Yes    Drug use: No    Sexual activity: Not on file   Other Topics Concern    Not on file   Social History Narrative    ** Merged History Encounter **

## 2017-12-17 ENCOUNTER — Encounter (INDEPENDENT_AMBULATORY_CARE_PROVIDER_SITE_OTHER): Payer: Self-pay | Admitting: Internal Medicine

## 2017-12-17 ENCOUNTER — Ambulatory Visit (INDEPENDENT_AMBULATORY_CARE_PROVIDER_SITE_OTHER): Payer: PPO | Admitting: Internal Medicine

## 2017-12-17 ENCOUNTER — Encounter (INDEPENDENT_AMBULATORY_CARE_PROVIDER_SITE_OTHER): Payer: PPO | Admitting: Internal Medicine

## 2017-12-17 VITALS — BP 110/80 | HR 91 | Temp 97.9°F | Resp 16 | Ht 72.5 in | Wt 214.0 lb

## 2017-12-17 DIAGNOSIS — G609 Hereditary and idiopathic neuropathy, unspecified: Secondary | ICD-10-CM

## 2017-12-17 DIAGNOSIS — E785 Hyperlipidemia, unspecified: Secondary | ICD-10-CM

## 2017-12-17 DIAGNOSIS — H6061 Unspecified chronic otitis externa, right ear: Secondary | ICD-10-CM

## 2017-12-17 DIAGNOSIS — I10 Essential (primary) hypertension: Secondary | ICD-10-CM

## 2017-12-17 DIAGNOSIS — F1021 Alcohol dependence, in remission: Secondary | ICD-10-CM

## 2017-12-17 DIAGNOSIS — I619 Nontraumatic intracerebral hemorrhage, unspecified: Secondary | ICD-10-CM

## 2017-12-17 DIAGNOSIS — E559 Vitamin D deficiency, unspecified: Secondary | ICD-10-CM

## 2017-12-17 DIAGNOSIS — Z6828 Body mass index (BMI) 28.0-28.9, adult: Secondary | ICD-10-CM

## 2017-12-17 MED ORDER — PREGABALIN 25 MG OR CAPS
ORAL_CAPSULE | ORAL | 1 refills | Status: DC
Start: 2017-12-17 — End: 2018-03-09

## 2017-12-17 MED ORDER — TRIAMCINOLONE ACETONIDE 0.1 % EX CREA
TOPICAL_CREAM | CUTANEOUS | 0 refills | Status: DC
Start: 2017-12-17 — End: 2018-05-02

## 2017-12-17 NOTE — Patient Instructions (Addendum)
Currently at 25 mg lyrica 2x per day  This week double   50 mg 2x per day  Next week  100 mg 2x per day  3rd wk  125 mg 2x per day    See me in 4 week     Back off med if comfusion     No alcohol at all

## 2017-12-22 ENCOUNTER — Encounter (INDEPENDENT_AMBULATORY_CARE_PROVIDER_SITE_OTHER): Payer: Self-pay | Admitting: Podiatrist

## 2017-12-22 ENCOUNTER — Ambulatory Visit: Payer: PPO | Attending: Cardiovascular Disease

## 2017-12-22 ENCOUNTER — Ambulatory Visit (INDEPENDENT_AMBULATORY_CARE_PROVIDER_SITE_OTHER): Payer: PPO | Admitting: Podiatrist

## 2017-12-22 VITALS — BP 120/90

## 2017-12-22 VITALS — Ht 76.0 in | Wt 212.0 lb

## 2017-12-22 DIAGNOSIS — M2021 Hallux rigidus, right foot: Secondary | ICD-10-CM

## 2017-12-22 DIAGNOSIS — E782 Mixed hyperlipidemia: Secondary | ICD-10-CM | POA: Insufficient documentation

## 2017-12-22 NOTE — Patient Instructions (Addendum)
-   Follow heart healthy eating diet, with low sodium (2g daily, 2000 -  daily), more fruits and vegetables intake.  - Follow my plate method for meal planning.  - Resume routine exercise when able.

## 2017-12-22 NOTE — Progress Notes (Signed)
Ronnie Mason  PCP:  [name], MD    SUBJECTIVE:      Review of patient's allergies indicates:  Allergies   Allergen Reactions    Adhesives Rash    Bee Venom Hives, Itching and Swelling    Gabapentin      Flu like symptoms, diarrhea, body ache upset stomach       has a current medication list which includes the following prescription(s): acetaminophen, amlodipine, atorvastatin, vitamin d3, clobetasol, cyanocobalamin, diclofenac, diclofenac, duloxetine hcl, duloxetine hcl, epinephrine, lidocaine, lisinopril, methocarbamol, pregabalin, pregabalin, primidone, thiamine hcl, and triamcinolone.    1.)  8 weeks postoperative, back into shoes.  2.)  Relates his preoperative symptoms being relieved.  3.)  Wearing Hoka shoes and taking it easy with activity.  4.)  Occasionally notices a twinge in his toe but nothing like he had before surgery.  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      OBJECTIVE:     1.)  Foot looks good for a weeks postoperative.  Still some swelling and incisional inflammation but overall good improvement.  2.)  Dorsiflexion is limited due to arthrosis in the joint, only about 20 of dorsiflexion and 10 of plantarflexion.  3.)  He does not experience any impingement symptoms or crepitation or catching with loading of the joint, however.  No sesamoid tenderness.    ASSESSMENT:  (M20.21) Hallux rigidus, right foot  (primary encounter diagnosis)    PLAN:      1.)  Doing well thus far, continue to gradually work back into activities.  2.)  Avoid activities that hyperdorsiflex the hallux.  Activities such as squatting.  3.)  Good supportive shoes like the Hoka or a work/hiking boot are helpful.  Rocker sole with a slight curve to it is best.  4.)  Barefoot and flimsy shoes will be harder on the joint.  5.)  As I talked with him before surgery and is possible he could have some persistent or worsening symptoms over time is he has significant arthrosis in the  joint.  If the joint were to deteriorate over time and become painful he would need arthrodesis.  6.)  I would certainly be concerned about performing that procedure in his case is he was quite noncompliant with the nonweightbearing regimen when he had just a cheilectomy.  If he did that sort of approach after having a fusion he would most likely end up breaking the screws and suffering a nonunion.  I would be very reticent to pursue that surgery given his approach to this most recent surgery.  He would need to provide full assurance of compliance before I would recommend it.    Miguel Rota, DPM  [facility]    Portions of this document were created with voice-recognition software and may contain occasional errant or misspelled words.

## 2017-12-22 NOTE — Progress Notes (Signed)
Ronnie Mason is a 67 year old male here for a New visit      Subjective/Objective     Referred by: Adan Sis*      Reason For Visit:   Chief Complaint   Patient presents with    Nutrition Counseling     Heart health, hyperlipidemia, hypertension    Weight Problem     long term healthy wt loss        An interpreter was not needed for the visit.    Ht  (1.93 m)  Weight History:   Last Recorded Weight:   12/22/17 212 lb (96.2 kg)   12/17/17 214 lb (97.1 kg)   10/14/17 (!) 224 lb (101.6 kg)   10/12/17 (!) 224 lb (101.6 kg)   09/30/17 (!) 232 lb (105.2 kg)   09/17/17 (!) 232 lb (105.2 kg)   07/01/17 (!) 231 lb 2 oz (104.8 kg)   05/26/17 (!) 226 lb (102.5 kg)   05/21/17 (!) 229 lb (103.9 kg)   05/05/17 (!) 225 lb (102.1 kg)   04/01/17 (!) 225 lb (102.1 kg)   03/29/17 (!) 225 lb (102.1 kg)     Body mass index is 25.81 kg/m. Adult BMI Classification: overweight BMI (25-29.9)  Weight Goal: Pt wants to lose 10 more lbs     Labs:   Sodium   Date Value Ref Range Status   01/16/2010 142       Potassium   Date Value Ref Range Status   10/12/2017 4.1 3.6 - 5.2 meq/L Final     Chloride   Date Value Ref Range Status   10/12/2017 101 98 - 108 meq/L Final     Carbon Dioxide, Total   Date Value Ref Range Status   10/12/2017 27 22 - 32 meq/L Final     Anion Gap   Date Value Ref Range Status   10/12/2017 10 4 - 12 Final     Glucose   Date Value Ref Range Status   10/12/2017 117 62 - 125 mg/dL Final     Urea Nitrogen   Date Value Ref Range Status   10/12/2017 20 8 - 21 mg/dL Final     Creatinine   Date Value Ref Range Status   10/12/2017 1.10 0.51 - 1.18 mg/dL Final     Calcium   Date Value Ref Range Status   10/12/2017 9.4 8.9 - 10.2 mg/dL Final     No results found for: GFREUROPNA  No results found for: GFRAFRICANA  GFR, Information   Date Value Ref Range Status   10/12/2017   Final    Calculated GFR in mL/min/1.73 m2 by MDRD equation.  Inaccurate with changing renal function.  See  [email or web address]     Cholesterol (HDL)   Date Value Ref Range Status   10/12/2017 119 >39 mg/dL Final   16/05/9603 540 >39 mg/dL Final     Cholesterol (LDL)   Date Value Ref Range Status   10/12/2017 71 <130 mg/dL Final   98/06/9146 70 <829 mg/dL Final     Cholesterol (Total)   Date Value Ref Range Status   10/12/2017 240 (H) <200 mg/dL Final   56/21/3086 578 (H) <200 mg/dL Final     Cholesterol/HDL Ratio   Date Value Ref Range Status   10/12/2017 2.0  Final   02/11/2017 1.9  Final     Triglyceride   Date Value Ref Range Status   10/12/2017 248 (H) <150 mg/dL Final   46/96/2952  131 <150 mg/dL Final     Hematocrit   Date Value Ref Range Status   02/11/2017 43 38 - 50 % Final     No results found for: FERRITIN  No results found for: FOLATE  No results found for: TRANSFERRIN  No results found for: IRON  No results found for: TIBC  No results found for: TRANSFERNSAT  Hemoglobin A1C   Date Value Ref Range Status   10/12/2017 5.3 4.0 - 6.0 % Final       Medications:  Current Outpatient Medications   Medication Sig Dispense Refill    Acetaminophen 500 MG Oral Tab 2 tablets bid      amLODIPine 5 MG Oral Tab Take 1 tablet (5 mg) by mouth 2 times a day. 90 tablet 1    Atorvastatin Calcium 10 MG Oral Tab Take 1 tablet (10 mg) by mouth daily. 90 tablet 2    Cholecalciferol (VITAMIN D3) 2000 units Oral Cap Take 1 capsule (2,000 Units) by mouth daily. For low level 1 capsule 1    Clobetasol Propionate 0.05 % External Ointment Small amount to rash as needed bid for 1-2 weeks at a time 15 g 1    Cyanocobalamin 1000 MCG Oral Tab one per day over-the-counter started 5/21 /2012 this level borderline low 1 Tab 1    DICLOFENAC OR Take 75 mg by mouth.      Diclofenac Sodium (VOLTAREN) 1 % Transdermal Gel Apply 2 g topically 4 times a day. Apply to neck, trapezius muscle, and low back. No more than 2g applied at a time. 1 Tube 5    DULoxetine HCl 30 MG Oral CAPSULE ENTERIC COATED PARTICLES  Take 1 capsule (30 mg) by mouth daily. 90 capsule 1    DULoxetine HCl 60 MG Oral CAPSULE ENTERIC COATED PARTICLES Take 1 capsule (60 mg) by mouth daily. 90 capsule 1    EPINEPHrine 0.3 MG/0.3ML Injection Solution Auto-injector Inject as instructed per patient package insert, 0.3 mg intramuscularly or subcutaneously into the thigh, if needed to treat anaphylaxis 3 each 1    Lidocaine 5 % External Patch Apply 1 patch onto the skin daily. Apply to painful area for up to 12 hours in a 24 hour period. 30 patch 10    Lisinopril 20 MG Oral Tab Take 1 tablet (20 mg) by mouth every 12 hours. 180 tablet 3    Methocarbamol 500 MG Oral Tab one at night for leg spasm may take 1/2 to one in am if not driving (Patient not taking: Reported on 12/17/2017) 60 tablet 3    Pregabalin 100 MG Oral Cap Take 1 capsule (100 mg) by mouth 2 times a day. (Patient not taking: Reported on 12/17/2017) 180 capsule 1    Pregabalin 25 MG Oral Cap Will be at goal of 125 bid , by increasing by 50 per day per week until total 125 total 180 capsule 1    Primidone 50 MG Oral Tab Take 1 tablet (50 mg) by mouth 2 times a day. 180 tablet 0    THIAMINE HCL OR       triamcinolone 0.1 % External Cream As needed to right ear bid x 1 week 1 Tube 0     No current facility-administered medications for this visit.        Vitamins/Minerals/Herbal Supplements:  Vitamin B12, B1, Vitamin D      GI tolerance: None    Social History     Tobacco Use    Smoking status:  Never Smoker    Smokeless tobacco: Former Estate agent Use Topics    Alcohol use: Yes    Drug use: No       Physical Activity: Pt was going to the gym 5 times a week prior to his foot injury, has been off exercising for the past 8 weeks d/t food surgery. Plan to return to routine exercise soon. Water aerobics for less pressure on lower extremities.    Special/alternative diet: None    Food intolerances/allergies: None    Diet History:   Br: apple and peanut butter, 1 glass of milk, coffee or  tea  Lunch: variety, more take outs such as soba noodle, hamburger with bacon from [employer], pizza with small salad, BLT sandwich, hotdog on the bun.   Dinner: similar to lunch but more cooked from home, such as fish, steak, pizza, with glass of wine.   Not snacking a lot, usually eats peanuts or almonds for snacks if needed.    Information from Patient:   Pt wants to learn how to manage high blood pressure and healthy wt loss with dietary approach.       Assessment    Nutrition Diagnosis: Limited adherence to nutrition related recmd  related to lack of information on sodium content of foods evidenced by food choices often include high sodium items. and unbalanced meal planning   related to inappropriate portions of different food groups evidenced by diet low on fruits/ vegetables routinely  Assessment Summary:   Pt would benefit from low sodium diet, cooking more from scratch and using less salt contaning seasoning.   Pt can achieve wt loss goal by return to routine exercise and increasing proportion of vegetables/ fruits in his overall diet pattern. (replace half of the meal with non-starchy vegetables and fruits).     Interventions    Learning Assessment     12/22/2017 1258 Outpatient Nutrition [facility] (12/22/2017 - Present)   Created by Tama Gander - Nutritionist Status: Complete               EDUCATION ASSESSMENT     Primary learner:  Patient GW - 12/22/2017 1258    Challenges impacting this teaching session:  None GW - 12/22/2017 1258    Desire and Motivation to Learn:  Engaging with education, asks questions GW - 12/22/2017 1258        TEACHING     Method(s) used for teaching:  Discussion with patient, Written information GW - 12/22/2017 1258    Topics Taught:  Diet/nutrition GW - 12/22/2017 1258        POST EDUCATION     Post educational response:  States understanding GW - 12/22/2017 1258    Follow-up:  Reinforce specific content, Reinforce teaching GW - 12/22/2017 1258    Total time(min):  60 GW -  12/22/2017 1258        Comments     MyPlate Planner handout from [employer] provided.    Heart healthy nutrition therapy handout from [employer] provided.         Edit History     Tama Gander - Nutritionist   12/22/2017 1258                        Plan/Recommendations    Patient Instructions   - Follow heart healthy eating diet, with low sodium (2g daily, 2000 -  daily), more fruits and vegetables intake.  - Follow my  plate method for meal planning.  - Resume routine exercise when able.        Plan:   Pt will ask for additional referral for follow up visits with RD.   Return in about 3 months (around 03/24/2018), or Call clinic for schedule.    Chritopher Coster Julious Oka) Ronen Bromwell, RD. CD.

## 2017-12-29 ENCOUNTER — Other Ambulatory Visit (INDEPENDENT_AMBULATORY_CARE_PROVIDER_SITE_OTHER): Payer: Self-pay | Admitting: Internal Medicine

## 2017-12-29 DIAGNOSIS — G250 Essential tremor: Secondary | ICD-10-CM

## 2017-12-30 MED ORDER — PRIMIDONE 50 MG OR TABS
50.0000 mg | ORAL_TABLET | Freq: Two times a day (BID) | ORAL | 1 refills | Status: DC
Start: 2017-12-30 — End: 2018-07-06

## 2017-12-30 NOTE — Telephone Encounter (Signed)
Refill request for Primidone requires your authorization because not addressed in visits, defer to provider.    If you want to authorize it, please indicate # of refills, sign the order and close the encounter.    If this medication is denied please have your staff inform the patient and schedule an appointment if necessary.

## 2018-01-17 ENCOUNTER — Ambulatory Visit (INDEPENDENT_AMBULATORY_CARE_PROVIDER_SITE_OTHER): Payer: PPO | Admitting: Internal Medicine

## 2018-01-17 VITALS — BP 136/80 | HR 92 | Ht 76.0 in | Wt 219.6 lb

## 2018-01-17 DIAGNOSIS — S93401A Sprain of unspecified ligament of right ankle, initial encounter: Secondary | ICD-10-CM

## 2018-01-17 DIAGNOSIS — Z6826 Body mass index (BMI) 26.0-26.9, adult: Secondary | ICD-10-CM

## 2018-01-17 DIAGNOSIS — F1021 Alcohol dependence, in remission: Secondary | ICD-10-CM

## 2018-01-17 DIAGNOSIS — I619 Nontraumatic intracerebral hemorrhage, unspecified: Secondary | ICD-10-CM

## 2018-01-17 DIAGNOSIS — I10 Essential (primary) hypertension: Secondary | ICD-10-CM

## 2018-01-17 DIAGNOSIS — G250 Essential tremor: Secondary | ICD-10-CM

## 2018-01-17 DIAGNOSIS — G609 Hereditary and idiopathic neuropathy, unspecified: Secondary | ICD-10-CM

## 2018-01-17 MED ORDER — AMLODIPINE BESYLATE 5 MG OR TABS
5.0000 mg | ORAL_TABLET | Freq: Two times a day (BID) | ORAL | 1 refills | Status: DC
Start: 2018-01-17 — End: 2018-04-22

## 2018-01-17 NOTE — Progress Notes (Signed)
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  Health Maintainace reviewed with patient today that are due  Health Maintenance   Topic Date Due    Zoster Vaccine (2 of 3) 09/03/2018 (Originally 07/17/2013)    Pneumococcal Vaccine: 65+ years (2 of 2 - PPSV23) 09/17/2018    Lipid Disorders Screening  10/13/2018    Depression Screening (PHQ-2)  12/18/2018    Colorectal Cancer Screening (Colonoscopy)  09/24/2020    Diabetes Screening  10/12/2020    Tetanus Vaccine  11/29/2025    Influenza Vaccine  Completed    Hepatitis C Screening  Addressed    HIV Screening  Addressed    Pneumococcal Vaccine: Pediatrics (0-5 years) and At-Risk Patients (6-64 years)  Aged Out        Patient   express understanding of care plan/medications  Created with the assistance of voice recognition software dictation    Risk of taking medication properly and follow up discussed especially to call if problem  MEDICATION as of Discharge  Current Outpatient Medications   Medication Sig  Dispense Refill    Acetaminophen 500 MG Oral Tab 2 tablets bid      amLODIPine 5 MG Oral Tab Take 1 tablet (5 mg) by mouth 2 times a day. 90 tablet 1    Atorvastatin Calcium 10 MG Oral Tab Take 1 tablet (10 mg) by mouth daily. 90 tablet 2    Cholecalciferol (VITAMIN D3) 2000 units Oral Cap Take 1 capsule (2,000 Units) by mouth daily. For low level 1 capsule 1    Clobetasol Propionate 0.05 % External Ointment Small amount to rash as needed bid for 1-2 weeks at a time 15 g 1    Cyanocobalamin 1000 MCG Oral Tab one per day over-the-counter started 5/21 /2012 this level borderline low 1 Tab 1    DICLOFENAC OR Take 75 mg by mouth.      Diclofenac Sodium (VOLTAREN) 1 % Transdermal Gel Apply 2 g topically 4 times a day. Apply to neck, trapezius muscle, and low back. No more than 2g applied at a time. 1 Tube 5    DULoxetine HCl 30 MG Oral CAPSULE ENTERIC COATED PARTICLES  Take 1 capsule (30 mg) by mouth daily. 90 capsule 1    DULoxetine HCl 60 MG Oral CAPSULE ENTERIC COATED PARTICLES Take 1 capsule (60 mg) by mouth daily. 90 capsule 1    EPINEPHrine 0.3 MG/0.3ML Injection Solution Auto-injector Inject as instructed per patient package insert, 0.3 mg intramuscularly or subcutaneously into the thigh, if needed to treat anaphylaxis 3 each 1    Lidocaine 5 % External Patch Apply 1 patch onto the skin daily. Apply to painful area for up to 12 hours in a 24 hour period. 30 patch 10    Lisinopril 20 MG Oral Tab Take 1 tablet (20 mg) by mouth every 12 hours. 180 tablet 3    Methocarbamol 500 MG Oral Tab one at night for leg spasm may take 1/2 to one in am if not driving (Patient not taking: Reported on 12/17/2017) 60 tablet 3    Pregabalin 100 MG Oral Cap Take 1 capsule (100 mg) by mouth 2 times a day. (Patient not taking: Reported on 12/17/2017) 180 capsule 1    Pregabalin 25 MG Oral Cap Will be at goal of 125 bid , by increasing by 50 per day per week until total 125 total 180 capsule 1    Primidone 50 MG Oral Tab  Take 1 tablet (50 mg) by mouth 2 times a day. 180 tablet 1    THIAMINE HCL OR       triamcinolone 0.1 % External Cream As needed to right ear bid x 1 week 1 Tube 0     No current facility-administered medications for this visit.         ________________________  Start of visit   Ronnie Mason is a 67 year old male              BP 136/80    Pulse 92    Ht 6\' 4"  (1.93 m)    Wt (!) 219 lb 9.6 oz (99.6 kg)    SpO2 97%    BMI 26.73 kg/m     Chief Complaint   Patient presents with    Neuropathy    Medication Management     Lyrica    Ankle Injury     right       HPI  History of:    pcp [name], MD reviewed last visit Dec 17, 2017 reinitiated Lyrica for neuropathy he had inadvertently decreased this medication some time earlier in the year for unknown reason-possibly medication confusion   History of alcohol use he was supposed to be in remission   Past brain trauma and parenchymal hemorrhage      t Care Team:   [name], MD as PCP - General (Internal Medicine)   [name], MD (Internal Medicine)   Jeanella Craze., Bryan Lemma, MD as Cardiologist  (Cardiology)   Geri Seminole, MD (Sports Medicine)   Holli Humbles, DPM (Podiatry)   Dixon Boos, MD (Orthopedic Surgery)       Today follow-up above issues:     Larey Seat in hole th night - taking tylenol , ibuprofen - fell   Lyrical 125 bid , the overall body aches and pain better  Can not define , brain ok w med - sleep ok , the toe sensitive ,was able to handle w tennis shoes  -    No long drinking       Orders Only on 10/12/17   1. Lipid Panel   Result Value  Ref Range    Cholesterol (Total) 240 (H) <200 mg/dL    Triglyceride 161 (H) <150 mg/dL    Cholesterol (HDL) 096 >39 mg/dL    Cholesterol (LDL) 71 <130 mg/dL    Non-HDL Cholesterol 121 0 - 159 mg/dL    Cholesterol/HDL Ratio 2.0     Lipid Panel, Additional Info. (NOTE)    2. Comprehensive Metabolic Panel   Result Value Ref Range    Sodium 138 135 -  145 meq/L    Potassium 4.1 3.6 - 5.2 meq/L    Chloride 101 98 - 108 meq/L    Carbon Dioxide, Total 27 22 - 32 meq/L    Anion Gap 10 4 - 12    Glucose 117 62 - 125 mg/dL    Urea Nitrogen 20 8 - 21 mg/dL    Creatinine 0.45 4.09 - 1.18 mg/dL    Protein (Total) 6.9 6.0 - 8.2 g/dL    Albumin 4.5 3.5 - 5.2 g/dL    Bilirubin (Total) 0.6 0.2 - 1.3 mg/dL    Calcium 9.4 8.9 - 81.1 mg/dL    AST (GOT) 33 9 - 38 U/L    Alkaline Phosphatase (Total) 91 36 - 161 U/L    ALT (GPT) 32 10 - 48 U/L    GFR, Calc, European American >60 >59 mL/min/[1.73_m2]    GFR, Calc, African American >60 >59 mL/min/[1.73_m2]    GFR, Information       Calculated GFR in mL/min/1.73 m2 by MDRD equation.  Inaccurate with changing renal function.  See http://[email or web address]   3. Hemoglobin A1c   Result Value Ref Range    Hemoglobin A1C 5.3 4.0 - 6.0 %   4. Lab Add On Order, Non-Micro   Result Value Ref Range    Lab Test Requested ths free t4     Specimen Type/Description Blood     Sample To Use Most recent     Test Request Status Order Processed    5. Free T4   Result Value Ref Range    Thyroxine (Free) 0.7 0.6 - 1.2 ng/dL   6. Thyroid Stimulating Hormone   Result Value Ref Range    Thyroid Stimulating Hormone 0.936 0.400 - 5.000 u[IU]/mL       MEDICATION PRIOR TO VISIT  Reports   Outpatient Medications Prior to Visit   Medication Sig Dispense Refill    Acetaminophen 500 MG Oral Tab 2 tablets bid      amLODIPine 5 MG Oral Tab Take 1 tablet (5 mg) by mouth 2 times a day. 90 tablet 1    Atorvastatin Calcium 10 MG Oral Tab Take 1 tablet (10 mg) by mouth daily. 90 tablet 2    Cholecalciferol (VITAMIN D3) 2000 units Oral Cap Take 1 capsule (2,000 Units) by mouth daily. For low level 1 capsule 1    Clobetasol Propionate 0.05 % External Ointment Small amount to rash as needed bid for 1-2 weeks at a time 15 g 1    Cyanocobalamin 1000 MCG Oral Tab one per day over-the-counter started 5/21 /2012 this level borderline low 1 Tab 1     DICLOFENAC OR Take 75 mg by mouth.      Diclofenac Sodium (VOLTAREN) 1 % Transdermal Gel Apply 2 g topically 4 times a day. Apply to neck, trapezius muscle, and low back. No more than 2g applied at a time. 1 Tube 5    DULoxetine HCl 30 MG Oral CAPSULE ENTERIC COATED PARTICLES Take 1 capsule (30 mg) by mouth  daily. 90 capsule 1    DULoxetine HCl 60 MG Oral CAPSULE ENTERIC COATED PARTICLES Take 1 capsule (60 mg) by mouth daily. 90 capsule 1    EPINEPHrine 0.3 MG/0.3ML Injection Solution Auto-injector Inject as instructed per patient package insert, 0.3 mg intramuscularly or subcutaneously into the thigh, if needed to treat anaphylaxis 3 each 1    Lidocaine 5 % External Patch Apply 1 patch onto the skin daily. Apply to painful area for up to 12 hours in a 24 hour period. 30 patch 10    Lisinopril 20 MG Oral Tab Take 1 tablet (20 mg) by mouth every 12 hours. 180 tablet 3    Methocarbamol 500 MG Oral Tab one at night for leg spasm may take 1/2 to one in am if not driving (Patient not taking: Reported on 12/17/2017) 60 tablet 3    Pregabalin 100 MG Oral Cap Take 1 capsule (100 mg) by mouth 2 times a day. (Patient not taking: Reported on 12/17/2017) 180 capsule 1    Pregabalin 25 MG Oral Cap Will be at goal of 125 bid , by increasing by 50 per day per week until total 125 total 180 capsule 1    Primidone 50 MG Oral Tab Take 1 tablet (50 mg) by mouth 2 times a day. 180 tablet 1    THIAMINE HCL OR       triamcinolone 0.1 % External Cream As needed to right ear bid x 1 week 1 Tube 0     No facility-administered medications prior to visit.        REVIEW OF SYSTEMS - are otherwise negative unless as noted in HPI above or as additional issues below  Constitutional:   all is well   Wonders if can have pain meds  Able to walk   Does not want x ray     PHYSICAL EXAM  BP 136/80    Pulse 92    Ht 6\' 4"  (1.93 m)    Wt (!) 219 lb 9.6 oz (99.6 kg)    SpO2 97%    BMI 26.73 kg/m     Well Dressed     Constitution, In general  looks:   well , good coloring  No diaphoresis  Patient is Alert  & Attentive  Patient is conversive    Psychiatric Mental Status / Neurologic Mental Status:  Affect:  Pleasant calm  Mood:  Good  Awake: Alert and attentive    \  Cardiovascular:   Heart Regular rate , rhythm, no murmur  No edema  Respiratory:   Not tachyphpnic  Clear to ascultation  No rales, rhonchi , without increased effort or stridor  No clubbing     \    Neurologic:   Grossly intact  Memory:   Attention       Cranial Nerve    Eyes pupil and lid are intact   II, III, IV, VIII   Facial Movement VII: grossly intact, no facial droop    Bruise on right ankle  Tender R malleolar burise in and out but can bear wt  No bone pain  H/o toe surgery     ++++++++++++++++++++++++++++++++++++++++++++++++++  Allergy  Review of patient's allergies indicates:  Allergies   Allergen Reactions    Adhesives Rash    Bee Venom Hives, Itching and Swelling    Gabapentin      Flu like symptoms, diarrhea, body ache upset stomach       ++++++++++++++++++++++++++++++++++++++++++++++++++    Patient Active  Problem List    Diagnosis Date Noted    Syncope [R55]     Hypotension [I95.9]     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]     URI (upper respiratory infection) [J06.9]     Greater trochanteric pain syndrome of right lower extremity [M25.551] 11/25/2017    Right lumbar radiculitis [M54.16] 05/05/2017    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] 01/11/2017     Osteoarthritis of right hip x ray report - moderate to advanced radiology read 01/11/2017      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[facility]) [S06.9X9A] 08/06/2016    Impaired mobility and ADLs [Z74.09] 04/08/2016    Difficulty in walking, not elsewhere classified [R26.2] 03/12/2016    Vitamin D deficiency [E55.9] 02/13/2016    Balance problem [R26.89] 01/23/2016    Chronic pain of both shoulders [M25.511, G89.29, M25.512] 01/23/2016     X ray arthritis   02/19/2016        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) [G91.2] 01/15/2016    Intraparenchymal hemorrhage of brain (HCC) [I61.9] 01/06/2016    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, 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, 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] 01/06/2016    Essential hypertension [I10] 12/16/2015    Essential tremor [G25.0] 12/16/2015    Hx of spontan intraparenchymal intracran bleed assoc with hypertension [Z86.79] 12/16/2015     [place] 4/29- 12/10/2015  Non-traumatic intraparenchymal hemorrhage, hypertensive of the left thalamus   Traumatic hemorrhage   -- falcine subdural hemorrhage, small cortical subarachnoid      Alcohol abuse [F10.10] 12/16/2015    Cerebral ventriculomegaly [G93.89] 10/09/2015    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    History of alcohol dependence (HCC) [F10.21] 06/26/2015    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) [G37.9] 05/07/2015    Idiopathic peripheral neuropathy [G60.9] 03/15/2015    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] 05/28/2014    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] 01/18/2014    Tremor [R25.1] 04/20/2013    Fall [W19.XXXA] 02/23/2013     BP < 100  Occasioanal tri[p and leg weakness      Back pain, lumbosacral [M54.5] 02/15/2013    Lumbar radiculopathy, chronic [M54.16] 02/15/2013    Neck pain [M54.2] 02/15/2013    Chronic pain [G89.29] 12/21/2012     Now at [facility], DO, Santiago Glad    Neck pain -burn c3-7 on left side per patient  Some trigger injection    Lumbar pain-since 80's better with exercise      Bee sting allergy [Z91.030] 12/21/2012     hives      Numbness and tingling of leg [R20.0, R20.2] 12/21/2012     Left calf -left foot long term suspect from back-suspect L 45      Chronic back pain [M54.9, G89.29] 12/21/2012     Mri in mid scape 1/214  Multilevel degenerative disc disease of the lumbar spine. Circumferential disc bulge at all levels below and including L1-2. No significant central  spinal canal stenosis or neuroforaminal narrowing.  2. Mild retrolisthesis of L5 on S1.            B12 deficiency [E53.8] 12/21/2012     Borderline low 5 /21/2014      Chronic renal insufficiency, stage III (moderate) (HCC) [N18.3] 12/21/2012     5/ 2013      Hyperlipidemia [E78.5] 09/26/2012    Cervicalgia [M54.2] 03/11/2010     Radiofrequency ablation of c3 to c 7  Past Medical History:   Diagnosis Date    Carotid Sinus Hypersensitivity     Disc disorder of lumbosacral region     Hip injury     HYPERTENSION      HYPOTENSION      Intraparenchymal hemorrhage of brain (HCC) 2017    Pain of right hip joint 01/23/2016    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 myelopathy or radiculopathy 05/28/2014    Syncope     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(HCC)     URI (upper respiratory infection)        Social History     Socioeconomic History    Marital status: Married     Spouse name: Not on file    Number of children: Not on file    Years of education: Not on file    Highest education level: Not on file   Occupational History    Not on file   Social Needs    Financial resource strain: Not on file    Food insecurity:     Worry: Not on file     Inability: Not on file    Transportation needs:     Medical: Not on file     Non-medical: Not on file   Tobacco Use    Smoking status: Never Smoker    Smokeless tobacco: Former Neurosurgeon   Substance and Sexual Activity    Alcohol use: Yes    Drug use: No    Sexual activity: Not on file   Lifestyle    Physical activity:     Days per week: Not on file     Minutes per session: Not on file    Stress: Not on file   Relationships    Social connections:     Talks on phone: Not on file     Gets together: Not on file     Attends religious service: Not on file     Active member of club or organization: Not on file     Attends meetings of clubs or organizations: Not on file     Relationship status: Not on file    Intimate partner violence:     Fear of current or ex  partner: Not on file     Emotionally abused: Not on file     Physically abused: Not on file     Forced sexual activity: Not on file   Other Topics Concern    Not on file   Social History Narrative    ** Merged History Encounter **              Family History     Problem (# of Occurrences) Relation (Name,Age of Onset)    Colon Cancer (1) Father    Heart (other) (1) Mother: MI    Other Family Hx (1) Other: myelodysplasia          Social History     Socioeconomic History    Marital status: Married     Spouse name: Not on file    Number of children: Not on file    Years of education: Not on file    Highest education level: Not on file   Occupational History    Not on file   Social Needs    Financial resource strain: Not on file    Food insecurity:     Worry: Not on file     Inability: Not on file    Transportation needs:     Medical:  Not on file     Non-medical: Not on file   Tobacco Use    Smoking status: Never Smoker    Smokeless tobacco: Former Neurosurgeon   Substance and Sexual Activity    Alcohol use: Yes    Drug use: No    Sexual activity: Not on file   Lifestyle    Physical activity:     Days per week: Not on file     Minutes per session: Not on file    Stress: Not on file   Relationships    Social connections:     Talks on phone: Not on file     Gets together: Not on file     Attends religious service: Not on file     Active member of club or organization: Not on file     Attends meetings of clubs or organizations: Not on file     Relationship status: Not on file    Intimate partner violence:     Fear of current or ex partner: Not on file     Emotionally abused: Not on file     Physically abused: Not on file     Forced sexual activity: Not on file   Other Topics Concern    Not on file   Social History Narrative    ** Merged History Encounter **

## 2018-01-17 NOTE — Patient Instructions (Signed)
Tylenol 2 gram per day   No oral nsaid preferred no ibuproen ,  No aleve because of bp   Do use your topical  And put your feet up

## 2018-01-24 ENCOUNTER — Other Ambulatory Visit (INDEPENDENT_AMBULATORY_CARE_PROVIDER_SITE_OTHER): Payer: Self-pay | Admitting: Internal Medicine

## 2018-01-24 DIAGNOSIS — M542 Cervicalgia: Secondary | ICD-10-CM

## 2018-01-24 MED ORDER — DICLOFENAC SODIUM 1 % TD GEL
2.0000 g | Freq: Four times a day (QID) | TRANSDERMAL | 0 refills | Status: DC
Start: 2018-01-24 — End: 2018-02-18

## 2018-02-18 ENCOUNTER — Ambulatory Visit (INDEPENDENT_AMBULATORY_CARE_PROVIDER_SITE_OTHER): Payer: PPO | Admitting: Family

## 2018-02-18 ENCOUNTER — Telehealth (INDEPENDENT_AMBULATORY_CARE_PROVIDER_SITE_OTHER): Payer: Self-pay | Admitting: Family Medicine

## 2018-02-18 ENCOUNTER — Other Ambulatory Visit (INDEPENDENT_AMBULATORY_CARE_PROVIDER_SITE_OTHER): Payer: Self-pay | Admitting: Internal Medicine

## 2018-02-18 VITALS — BP 122/70 | HR 77 | Temp 98.8°F | Resp 16 | Ht 76.0 in | Wt 222.0 lb

## 2018-02-18 DIAGNOSIS — Z6827 Body mass index (BMI) 27.0-27.9, adult: Secondary | ICD-10-CM

## 2018-02-18 DIAGNOSIS — M10071 Idiopathic gout, right ankle and foot: Secondary | ICD-10-CM

## 2018-02-18 DIAGNOSIS — M542 Cervicalgia: Secondary | ICD-10-CM

## 2018-02-18 MED ORDER — PREDNISONE 20 MG OR TABS
40.0000 mg | ORAL_TABLET | Freq: Every day | ORAL | 0 refills | Status: AC
Start: 2018-02-18 — End: 2018-02-23

## 2018-02-18 NOTE — Telephone Encounter (Signed)
GIVEN AN APPOINTMENT FOR 2:40

## 2018-02-18 NOTE — Progress Notes (Signed)
[facility]  [place] [facility] Initial Clinic Note    DATE 02/18/2018    Ronnie Mason is a 67 year old male new patient who is here today for right great toe joint inflammation.     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/06/2016    Impaired mobility and ADLs 04/08/2016    Difficulty in walking, not elsewhere classified 03/12/2016    Vitamin D deficiency 02/13/2016    Balance problem 01/23/2016    Chronic pain of both shoulders 01/23/2016     X ray arthritis  02/19/2016        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) 01/15/2016    Intraparenchymal hemorrhage of brain (HCC) 01/06/2016    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    Essential hypertension 12/16/2015    Essential tremor 12/16/2015    Hx of spontan intraparenchymal intracran bleed assoc with hypertension 12/16/2015     [place] 4/29- 12/10/2015  Non-traumatic intraparenchymal hemorrhage, hypertensive of the left thalamus   Traumatic hemorrhage   -- falcine subdural hemorrhage, small cortical subarachnoid      Alcohol abuse 12/16/2015    Cerebral ventriculomegaly 10/09/2015    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    History of alcohol dependence (HCC) 06/26/2015    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) 05/07/2015    Idiopathic peripheral neuropathy 03/15/2015    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 myelopathy or radiculopathy 05/28/2014    Sensory neuropathy  01/18/2014    Tremor 04/20/2013    Fall 02/23/2013     BP < 100  Occasioanal tri[p and leg weakness      Back pain, lumbosacral 02/15/2013    Lumbar radiculopathy, chronic 02/15/2013    Neck pain 02/15/2013    Chronic pain 12/21/2012     Now at [place], DO, Santiago Glad    Neck pain -burn c3-7 on left side per patient  Some trigger injection    Lumbar pain-since 80's better with exercise      Bee sting allergy 12/21/2012     hives      Numbness and tingling of leg 12/21/2012     Left calf -left foot long term suspect from back-suspect L 45      Chronic back pain 12/21/2012     Mri in mid scape 1/214  Multilevel degenerative disc disease of the lumbar spine. Circumferential disc bulge at all levels below and including L1-2. No significant central spinal canal stenosis or neuroforaminal narrowing.  2. Mild retrolisthesis of L5 on S1.            B12 deficiency 12/21/2012     Borderline low 5 /21/2014      Chronic renal insufficiency, stage III (moderate) (HCC) 12/21/2012     5/ 2013      Hyperlipidemia 09/26/2012    Cervicalgia 03/11/2010     Radiofrequency ablation of c3 to c 7  CHIEF COMPLAINT: Musculoskeletal Problem (Right foot had surgery on the great toe to remove calcium deposits and bone spurs in February. Became inflammed on Thursday, and tender on the bottom. Now it is swollen and red and painful. Looks infected. It feels warm to touch. )    INTERVAL HISTORY/HPI:  Ronnie Mason had foot surgery in February of this year. He has been receiving physical therapy for this. He is a resident of [place]. He was walking yesterday and noticed it when he was walking that there was increased pain in the right toe. Within a half hour it was red. Last night the pain increased and the redness was worse. He has a hot, red spot and it is inflamed and hurts to walk on. This occurred overnight and is a sudden onset.     Additional History Details:  I reviewed the patient's recorded medical history, and updated  as appropriate the medications, problem list, allergies and past medical history with the patient.      PAST MEDICAL HISTORY:  Past Medical History:   Diagnosis Date    Carotid Sinus Hypersensitivity     Disc disorder of lumbosacral region     Hip injury     HYPERTENSION      HYPOTENSION      Intraparenchymal hemorrhage of brain (HCC) 2017    Pain of right hip joint 01/23/2016    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 myelopathy or radiculopathy 05/28/2014    Syncope     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(HCC)     URI (upper respiratory infection)      MEDICATIONS:  Current Outpatient Medications   Medication Sig Dispense Refill    Acetaminophen 500 MG Oral Tab 2 tablets bid      amLODIPine 5 MG Oral Tab Take 1 tablet (5 mg) by mouth 2 times a day. 90 tablet 1    Atorvastatin Calcium 10 MG Oral Tab Take 1 tablet (10 mg) by mouth daily. 90 tablet 2    Cholecalciferol (VITAMIN D3) 2000 units Oral Cap Take 1 capsule (2,000 Units) by mouth daily. For low level 1 capsule 1    Cyanocobalamin 1000 MCG Oral Tab one per day over-the-counter started 5/21 /2012 this level borderline low 1 Tab 1    diclofenac (VOLTAREN) 1 % Transdermal Gel Apply 2 g topically 4 times a day. Apply to neck, trapezius muscle, and low back. No more than 2g applied at a time. 1 Tube 0    DULoxetine HCl 30 MG Oral CAPSULE ENTERIC COATED PARTICLES Take 1 capsule (30 mg) by mouth daily. 90 capsule 1    DULoxetine HCl 60 MG Oral CAPSULE ENTERIC COATED PARTICLES Take 1 capsule (60 mg) by mouth daily. 90 capsule 1    EPINEPHrine 0.3 MG/0.3ML Injection Solution Auto-injector Inject as instructed per patient package insert, 0.3 mg intramuscularly or subcutaneously into the thigh, if needed to treat anaphylaxis 3 each 1    Lidocaine 5 % External Patch Apply 1 patch onto the skin daily. Apply to painful area for up to 12 hours in a 24 hour period. 30 patch 10    Lisinopril 20 MG Oral Tab Take 1 tablet (20 mg) by mouth every 12 hours. 180 tablet 3     predniSONE 20 MG Oral Tab Take 2 tablets (40 mg) by mouth daily for 5 days. 10 tablet 0    Pregabalin 100 MG Oral Cap Take 1 capsule (100 mg) by mouth 2 times a day. 180 capsule 1    Pregabalin  25 MG Oral Cap Will be at goal of 125 bid , by increasing by 50 per day per week until total 125 total (Patient taking differently: 100 mg every morning. Will be at goal of 125 bid , by increasing by 50 per day per week until total 125 total) 180 capsule 1    Primidone 50 MG Oral Tab Take 1 tablet (50 mg) by mouth 2 times a day. 180 tablet 1    THIAMINE HCL OR       triamcinolone 0.1 % External Cream As needed to right ear bid x 1 week 1 Tube 0     No current facility-administered medications for this visit.      ALLERGIES:  Review of patient's allergies indicates:  Allergies   Allergen Reactions    Adhesives Rash    Bee Venom Hives, Itching and Swelling    Gabapentin      Flu like symptoms, diarrhea, body ache upset stomach     RISK FACTORS:  Substance abuse: No  Tobacco history: Waldemar reports that he has never smoked. He has quit using smokeless tobacco.  Comments:      FAMILY MEDICAL HISTORY:  Mitchael's family history includes Colon Cancer in his father; Heart (other) in his mother; Other Family Hx in an other family member.    REVIEW OF SYSTEMS:  CONSTITUTIONAL: Denies, fatigue, unexpected weight loss, unexpected weight gain, fever, chills, night sweats, any constitutional problems  RESPIRATORY: Denies, dyspnea on exertion, dyspnea at rest, cough, wheezing, any respiratory problems  CARDIOVASCULAR: Denies, chest pain or pressure at rest or during exercise, palpitations, peripheral edema, claudication, any cardiovascular problems  MUSCULOSKEL/RHEUMATOLOGIC: Denies, .  Does have, joint swelling right great toe lateral , joint stiffness right great toe  INTEGUMENTARY: Denies, skin rash, easy bruising, hair loss, changes in moles or new moles, any skin problems      PHYSICAL EXAM:  BP 122/70    Pulse 77    Temp 98.8  F (37.1 C) (Temporal)    Resp 16    Ht 6\' 4"  (1.93 m)    Wt (!) 222 lb (100.7 kg)    SpO2 99%    BMI 27.02 kg/m     Exam:  Physical Exam   Constitutional: He is oriented to person, place, and time. He appears well-developed and well-nourished.   HENT:   Head: Normocephalic and atraumatic.   Eyes: Pupils are equal, round, and reactive to light. Conjunctivae and EOM are normal.   Cardiovascular: Normal rate, regular rhythm and normal heart sounds. Exam reveals no gallop and no friction rub.   No murmur heard.  Pulmonary/Chest: Effort normal and breath sounds normal. No respiratory distress. He has no wheezes. He has no rales. He exhibits no tenderness.   Musculoskeletal: He exhibits tenderness.        Right ankle: He exhibits swelling. Tenderness.        Feet:    Right great toe lateral aspect with erythema and tenderness on palpation and weight bearing. Joint is inflamed. No exudate, no fluctuance, no induration.      Neurological: He is alert and oriented to person, place, and time.   Skin: Skin is warm, dry and intact. No cyanosis. Nails show no clubbing.   Psychiatric: He has a normal mood and affect. His speech is normal and behavior is normal. Judgment and thought content normal. Cognition and memory are normal.   Vitals reviewed.    Ronnie Mason is visiting Ronnie Mason for a week. 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 He will be returning next week and was instructed to follow up with his PCP.     Ronnie HuaDavid was seen today for musculoskeletal problem.    Diagnoses and all orders for this visit:    Acute idiopathic gout of right foot  -     predniSONE 20 MG Oral Tab; Take 2 tablets (40 mg) by mouth daily for 5 days.

## 2018-02-18 NOTE — Patient Instructions (Addendum)
Patient Education     Treating Gout Attacks     Raising the joint above the level of your heart can help reduce gout symptoms.     Gout is a disease that affects the joints. It is caused by excess uric acid in your blood that may lead to crystals forming in your joints. Left untreated, it can lead to painful foot and joint deformities and even kidney problems. But, by treating gout early, you can relieve pain and help prevent future problems. Gout can usually be treated with medicine and proper diet. In severe cases, surgery may be needed.  Gout attacks are painful and often happen more than once. Taking medicines may reduce pain and prevent attacks in the future. There are also some things you can do at home to relieve symptoms.  Medicines for gout  Your healthcare provider may prescribe a daily medicine to reduce levels of uric acid. Reducing your uric acid levels may help prevent gout attacks. Allopurinol is one commonly used medicine takendaily to reduce uric acid levels. Other daily medicines used to reduce uric acid levels include febuxostat, lesinurad, and probencid. Other medicines can help relieve pain and swelling during an acuteattack.Medicines such as NSAIDs (nonsteroidal anti-inflammatory medicines), steroids, and colchicine may be prescribed for intermittent use to relieve an acute gout attack. Be sure to take your medicine as directed.  What you can do  Below are some things you can do at home to relieve gout symptoms. Your healthcare provider may have other tips.   Rest the painful joint as much as you can.   Raise the painful joint so it is at a level higher than your heart.   Use ice for 10 minutes every 1 to 2 hours as possible.  How can I prevent gout?  With a little effort, you may be able to prevent gout attacks in the future. Here are some things you can do:   Don't eat foods high in purines  ? Certain meats (red meat, processed meat, Malaw[place]urkey)  ? Organ meats (kidney, liver,  sweetbread)  ? Shellfish (lobster, crab, shrimp, scallop, mussel)  ? Certain fish (anchovy, sardine, herring, mackerel)   Take any medicines prescribed by your healthcare provider.   Lose weight if you need to.   Reduce high fructose corn syrup in meals and drinks.   Reduce or cut out alcohol, particularly beer, but also red wine and spirits.   Control blood pressure, diabetes, and cholesterol.   Drink plenty of water to help flush uric acid from your body.  Date Last Reviewed: 11/01/2016   2000-2018 [employer] [address], [place], [place] [id number]. All rights reserved. This information is not intended as a substitute for professional medical care. Always follow your healthcare professional's instructions.  Patient Education     Discharge from Outpatient Surgery  You are scheduled for outpatient surgery. This is also called same-day or ambulatory surgery. This sheet will help you learn what to expect after your surgery.  Going home  You will be told when you can go home after your surgery. If you were given medicine (anesthesia) to make you sleepy and prevent pain, you may still feel drowsy or have an upset stomach.   Be sure to have an adult family member or friend ready to drive you home.   If you need crutches or other tools, be sure you are shown how to use them. If you can, have a family member or friend listen to  the instructions with you.   Have someone ready to stay with you for at least the first night.  Recovering at home  At home, follow all instructions Myrtis Ser been given.   Don't drive or make any important decisions for at least 24 hours after getting any type of sedation or anesthesia.   Keep any dressing or bandage you have clean and dry. Know when you can change or remove the bandage. Ask when you can shower or take a bath again.   Follow your healthcare providers instructions about food and drink. At first, your stomach may be upset. You may not feel like eating  much. Drink plenty of clear liquids such as water, apple juice, or ginger ale.   Follow instructions for treating constipation.   Unless youre told not to, get up and move around. This helps you heal. Walking a few times a day is often recommended.   Do any deep breathing or coughing exercises as instructed. These help keep your lungs clear.   Be sure to follow all after-care instructions.  Taking medicines  You may be given pain medicines or other medicines after surgery.   Take pain medicine at regular times as instructed. Do not wait until pain gets bad before you take it. Take only as much pain medicine as prescribed.   Dont drive, use power tools or other dangerous machines, or drink alcohol while taking pain medicine.   If you have been given antibiotics, take them until they are gone or you are told to stop. If you have trouble taking them or have side effects, call your healthcare provider.  Following up  Someone from your healthcare team may call to check how youre doing. Tell him or her if you have any problems or questions.  When to call your healthcare provider  Call if you have any of the following:   You cant keep food or fluids down   You have not urinated within the time your healthcare team noted   You have not had a bowel movement within the time your healthcare team noted   Your bandage soaks through (some bleeding and leakage is normal)   Your pain gets worse and is not eased by pain medicine  Call if you have any of these signs of infection:   Fever of 100.16F (38C) or higher, or as directed   Bleeding or swelling that increases   Bad smell, warmth, or green or yellow discharge from the cut (incision)   A red, hard, hot, or painful area around the cut or on your legs   Shortness of breath   Chest pain   Date Last Reviewed: 12/02/2014   2000-2018 [employer]. [address] [place], [place] [address]. All rights reserved. This information is not intended as a  substitute for professional medical care. Always follow your healthcare professional's instructions.

## 2018-02-18 NOTE — Telephone Encounter (Signed)
Right foot is swollen and painful. He had surgery on it in Feb, [place] residence, thought it was [place] and [place] [address] and it is now painful.  He is on the lobby.  Please advise  Thank you

## 2018-02-21 ENCOUNTER — Encounter (INDEPENDENT_AMBULATORY_CARE_PROVIDER_SITE_OTHER): Payer: PPO | Admitting: Family

## 2018-02-21 MED ORDER — DICLOFENAC SODIUM 1 % TD GEL
2.0000 g | Freq: Four times a day (QID) | TRANSDERMAL | 0 refills | Status: DC
Start: 2018-02-21 — End: 2018-03-09

## 2018-02-28 ENCOUNTER — Other Ambulatory Visit (INDEPENDENT_AMBULATORY_CARE_PROVIDER_SITE_OTHER): Payer: Self-pay | Admitting: Internal Medicine

## 2018-03-01 ENCOUNTER — Telehealth (INDEPENDENT_AMBULATORY_CARE_PROVIDER_SITE_OTHER): Payer: Self-pay | Admitting: Podiatrist

## 2018-03-01 NOTE — Telephone Encounter (Signed)
LVM for patient advising that I have reviewed note from [facility] Provider and recommend he follow up with Dr. Marisa SprinklesKraft.  Advised that Dr. Frederic JerichoMcInnes is leaving clinic and last day is tomorrow.  He is welcome to call and make an appointment with another provider for next week or contact Dr. Joette CatchingKraft's office.

## 2018-03-01 NOTE — Telephone Encounter (Signed)
[facility].  [facility] [place] [facility] [place]. He is hoping to catch her tomorrow when she is in.   I suggested he call before he come in.   Please reach out to patient to discuss treatment options.  Thank you.

## 2018-03-04 ENCOUNTER — Encounter (INDEPENDENT_AMBULATORY_CARE_PROVIDER_SITE_OTHER): Payer: PPO | Admitting: Internal Medicine

## 2018-03-09 ENCOUNTER — Ambulatory Visit (INDEPENDENT_AMBULATORY_CARE_PROVIDER_SITE_OTHER): Payer: PPO | Admitting: Internal Medicine

## 2018-03-09 VITALS — BP 110/70 | HR 86 | Temp 98.4°F | Resp 16 | Ht 75.98 in | Wt 222.0 lb

## 2018-03-09 DIAGNOSIS — E785 Hyperlipidemia, unspecified: Secondary | ICD-10-CM

## 2018-03-09 DIAGNOSIS — M79671 Pain in right foot: Secondary | ICD-10-CM

## 2018-03-09 DIAGNOSIS — I10 Essential (primary) hypertension: Secondary | ICD-10-CM

## 2018-03-09 DIAGNOSIS — M79674 Pain in right toe(s): Secondary | ICD-10-CM

## 2018-03-09 DIAGNOSIS — M542 Cervicalgia: Secondary | ICD-10-CM

## 2018-03-09 DIAGNOSIS — F1021 Alcohol dependence, in remission: Secondary | ICD-10-CM

## 2018-03-09 DIAGNOSIS — Z6827 Body mass index (BMI) 27.0-27.9, adult: Secondary | ICD-10-CM

## 2018-03-09 DIAGNOSIS — G609 Hereditary and idiopathic neuropathy, unspecified: Secondary | ICD-10-CM

## 2018-03-09 MED ORDER — DICLOFENAC SODIUM 1 % TD GEL
2.0000 g | Freq: Four times a day (QID) | TRANSDERMAL | 4 refills | Status: DC
Start: 2018-03-09 — End: 2018-07-22

## 2018-03-09 MED ORDER — PREGABALIN 50 MG OR CAPS
ORAL_CAPSULE | ORAL | 1 refills | Status: DC
Start: 2018-03-09 — End: 2018-09-13

## 2018-03-09 MED ORDER — PREGABALIN 100 MG OR CAPS
100.0000 mg | ORAL_CAPSULE | Freq: Two times a day (BID) | ORAL | 1 refills | Status: DC
Start: 2018-03-09 — End: 2018-09-13

## 2018-03-09 NOTE — Progress Notes (Signed)
Ronnie Mason is a 67 year old male       BP 110/70    Pulse 86    Temp 98.4 F (36.9 C) (Oral)    Resp 16    Ht 6' 3.98" (1.93 m)    Wt (!) 222 lb (100.7 kg)    SpO2 97%    BMI 27.03 kg/m   Chief Complaint   Patient presents with    Foot Swelling     pts right foot starting swelling 2 weeks ago, went to urgent care on [facility] and was prescribed Prednisone.     Patient seen with       IMPRESSION / PLAN / DISCUSSION   (G60.9) Idiopathic peripheral neuropathy  (primary encounter diagnosis)  (I10) Essential hypertension  (F10.21) History of alcohol dependence (HCC)  (E78.5) Hyperlipidemia, unspecified hyperlipidemia type  (M54.2) Neck pain  (M79.671) Right foot pain  (M79.674) Pain of toe of right foot    Chane was seen today for foot swelling.    Diagnoses and all orders for this visit:    Idiopathic peripheral neuropathy  -     pregabalin 100 MG Oral Cap; Take 1 capsule (100 mg) by mouth 2 times a day.  -     pregabalin 50 MG Oral Cap; 1 po bid  -     DISABLED PARKING PERMIT AUTHORIZATION    Essential hypertension  -     CBC with Differential  -     Comprehensive Metabolic Panel    History of alcohol dependence (HCC)    Hyperlipidemia, unspecified hyperlipidemia type    Neck pain  -     diclofenac (VOLTAREN) 1 % Transdermal Gel; Apply 2 g topically 4 times a day. Apply to neck, trapezius muscle, and low back. Toe  No more than 32 g applied per day total  -     DISABLED PARKING PERMIT AUTHORIZATION    Right foot pain  -     REFERRAL TO ORTHOPEDICS  -     XR FOOT 3 VW RIGHT; Future  -     XR ANKLE 3+ VW RIGHT; Future  -     DISABLED PARKING PERMIT AUTHORIZATION    Pain of toe of right foot  -     Uric Acid  -     DISABLED PARKING PERMIT AUTHORIZATION      He is not driking  lyrica now at 125 bid will increase to 150 bid to see if it helps him further w back pain    Toe - not sure why the toe hurts  Uric acid - if high may help , if low does not r/o    X ray if he can not get into podiatry as Dr [facility] innes  left  He would like to see Dr [name]   It is red   He is now off the heavy meats    The ankle has been in pain and puffy since he rolled it  But the toe was only affected recently now better w prednisone    See picture       I spent a total time of 25 minutes face-to-face with the patient, of which more than 50% was spent counseling and coordinating care as outlined in this note.      No follow-ups on file.  Health Maintainace reviewed with patient today that are due  Health Maintenance   Topic Date Due    Zoster Vaccine (2 of 3) 09/03/2018 (  Originally 07/17/2013)    Influenza Vaccine (1) 05/03/2018    Pneumococcal Vaccine: 65+ years (2 of 2 - PPSV23) 09/17/2018    Lipid Disorders Screening  10/13/2018    Depression Screening (PHQ-2)  03/10/2019    Colorectal Cancer Screening (Colonoscopy)  09/24/2020    Diabetes Screening  10/12/2020    Tetanus Vaccine  11/29/2025    Hepatitis C Screening  Addressed    HIV Screening  Addressed    Pneumococcal Vaccine: Pediatrics (0-5 years) and At-Risk Patients (6-64 years)  Aged Out        Patient   express understanding of care plan/medications  Created with the assistance of voice recognition software dictation    Risk of taking medication properly and follow up discussed especially to call if problem  MEDICATION as of Discharge  Current Outpatient Medications   Medication Sig Dispense Refill    Acetaminophen 500 MG Oral Tab 2 tablets bid      amLODIPine 5 MG Oral Tab Take 1 tablet (5 mg) by mouth 2 times a day. 90 tablet 1    Atorvastatin Calcium 10 MG Oral Tab Take 1 tablet (10 mg) by mouth daily. 90 tablet 2    Cholecalciferol (VITAMIN D3) 2000 units Oral Cap Take 1 capsule (2,000 Units) by mouth daily. For low level 1 capsule 1    Cyanocobalamin 1000 MCG Oral Tab one per day over-the-counter started 5/21 /2012 this level borderline low 1 Tab 1    diclofenac (VOLTAREN) 1 % Transdermal Gel Apply 2 g topically 4 times a day. Apply to neck, trapezius muscle, and  low back. Toe  No more than 32 g applied per day total 3 Tube 4    DULoxetine HCl 30 MG Oral CAPSULE ENTERIC COATED PARTICLES Take 1 capsule (30 mg) by mouth daily. 90 capsule 1    DULoxetine HCl 60 MG Oral CAPSULE ENTERIC COATED PARTICLES Take 1 capsule (60 mg) by mouth daily. 90 capsule 1    EPINEPHrine 0.3 MG/0.3ML Injection Solution Auto-injector Inject as instructed per patient package insert, 0.3 mg intramuscularly or subcutaneously into the thigh, if needed to treat anaphylaxis 3 each 1    Labetalol HCl 100 MG Oral Tab Take 100 mg by mouth.      Lidocaine 5 % External Patch Apply 1 patch onto the skin daily. Apply to painful area for up to 12 hours in a 24 hour period. 30 patch 10    Lisinopril 20 MG Oral Tab Take 1 tablet (20 mg) by mouth every 12 hours. 180 tablet 3    predniSONE 20 MG Oral Tab Take 20 mg by mouth.      pregabalin 100 MG Oral Cap Take 1 capsule (100 mg) by mouth 2 times a day. 180 capsule 1    pregabalin 50 MG Oral Cap 1 po bid 180 capsule 1    Primidone 50 MG Oral Tab Take 1 tablet (50 mg) by mouth 2 times a day. 180 tablet 1    THIAMINE HCL OR       triamcinolone 0.1 % External Cream As needed to right ear bid x 1 week 1 Tube 0     No current facility-administered medications for this visit.         ________________________  Start of visit   Ronnie Mason is a 67 year old male                   BP 110/70    Pulse 86  Temp 98.4 F (36.9 C) (Oral)    Resp 16    Ht 6' 3.98" (1.93 m)    Wt (!) 222 lb (100.7 kg)    SpO2 97%    BMI 27.03 kg/m   Chief Complaint   Patient presents with    Foot Swelling     pts right foot starting swelling 2 weeks ago, went to urgent care on [facility] and was prescribed Prednisone.     HPI  History of:    pcp [name], MD reviewed last visit 01/2018 redose lyrica as he was confused and was taking less than prescribed      Patient Care Team:  [name], MD as PCP - General (Internal Medicine)  [name], MD  (Internal Medicine)  Jeanella Craze., Bryan Lemma, MD as Cardiologist  (Cardiology)  Geri Seminole, MD (Sports Medicine)  Holli Humbles, DPM (Podiatry)  Dixon Boos, MD (Orthopedic Surgery)       Today follow-up above issues:     Toe feb march 2019 had Right toe surgery w Dr Erlinda Hong innes   2 days prior to our last visit fell on rabbit hole 01/2018 - only ankle right was affected, toe was ok  , then gout 2 fridays ago in am woke up with pain and swelling  - went to clinic on [place] the whole foot was red and swollen , went down w prednisone - came on with steak and shrimp - he had 5 d of prednisone 40 mg - so first probable gout attack - no lab or x ray done    They suggested gout- this would have been his first time-      So tried to get into Dr Alwyn Ren as gone out of [place]    He is wondering if should have mri    As currently the toe is throbbing x 2 wks since gout , the pad hurts    It has been red since surgery   During red increase    Since fall can feel to ankle and ankle bone   And lateral ankle since fall    Since the fall   It has hurt the first metatarsal     Will contact Dr Alan Mulder on the lumbar injection   Neck ok    Would like to inc the lyrica from 125 bid to 150 bid - he wants to see back could be better   Will also follow up Dr Alan Mulder       Orders Only on 10/12/17   1. Lipid Panel   Result Value Ref Range    Cholesterol (Total) 240 (H) <200 mg/dL    Triglyceride 540 (H) <150 mg/dL    Cholesterol (HDL) 981 >39 mg/dL    Cholesterol (LDL) 71 <130 mg/dL    Non-HDL Cholesterol 121 0 - 159 mg/dL    Cholesterol/HDL Ratio 2.0     Lipid Panel, Additional Info. (NOTE)    2. Comprehensive Metabolic Panel   Result Value Ref Range    Sodium 138 135 - 145 meq/L    Potassium 4.1 3.6 - 5.2 meq/L    Chloride 101 98 - 108 meq/L    Carbon Dioxide, Total 27 22 - 32 meq/L    Anion Gap 10 4 - 12    Glucose 117 62 - 125 mg/dL    Urea Nitrogen 20 8 - 21 mg/dL    Creatinine 1.91 4.78 - 1.18 mg/dL  Protein  (Total) 6.9 6.0 - 8.2 g/dL    Albumin 4.5 3.5 - 5.2 g/dL    Bilirubin (Total) 0.6 0.2 - 1.3 mg/dL    Calcium 9.4 8.9 - 16.1 mg/dL    AST (GOT) 33 9 - 38 U/L    Alkaline Phosphatase (Total) 91 36 - 161 U/L    ALT (GPT) 32 10 - 48 U/L    GFR, Calc, European American >60 >59 mL/min/[1.73_m2]    GFR, Calc, African American >60 >59 mL/min/[1.73_m2]    GFR, Information       Calculated GFR in mL/min/1.73 m2 by MDRD equation.  Inaccurate with changing renal function.  See http://depts.[email or web address].it.html   3. Hemoglobin A1c   Result Value Ref Range    Hemoglobin A1C 5.3 4.0 - 6.0 %   4. Lab Add On Order, Non-Micro   Result Value Ref Range    Lab Test Requested ths free t4     Specimen Type/Description Blood     Sample To Use Most recent     Test Request Status Order Processed    5. Free T4   Result Value Ref Range    Thyroxine (Free) 0.7 0.6 - 1.2 ng/dL   6. Thyroid Stimulating Hormone   Result Value Ref Range    Thyroid Stimulating Hormone 0.936 0.400 - 5.000 u[IU]/mL       No results found.    MEDICATION PRIOR TO VISIT  Reports   Outpatient Medications Prior to Visit   Medication Sig Dispense Refill    Acetaminophen 500 MG Oral Tab 2 tablets bid      amLODIPine 5 MG Oral Tab Take 1 tablet (5 mg) by mouth 2 times a day. 90 tablet 1    Atorvastatin Calcium 10 MG Oral Tab Take 1 tablet (10 mg) by mouth daily. 90 tablet 2    Cholecalciferol (VITAMIN D3) 2000 units Oral Cap Take 1 capsule (2,000 Units) by mouth daily. For low level 1 capsule 1    Cyanocobalamin 1000 MCG Oral Tab one per day over-the-counter started 5/21 /2012 this level borderline low 1 Tab 1    diclofenac (VOLTAREN) 1 % Transdermal Gel Apply 2 g topically 4 times a day. Apply to neck, trapezius muscle, and low back. No more than 2g applied at a time. 1 Tube 0    diclofenac 75 MG Oral Tab EC Take 75 mg by mouth.      DULoxetine HCl 30 MG Oral CAPSULE ENTERIC COATED PARTICLES Take 1 capsule (30 mg) by mouth daily. 90 capsule 1     DULoxetine HCl 60 MG Oral CAPSULE ENTERIC COATED PARTICLES Take 1 capsule (60 mg) by mouth daily. 90 capsule 1    EPINEPHrine 0.3 MG/0.3ML Injection Solution Auto-injector Inject as instructed per patient package insert, 0.3 mg intramuscularly or subcutaneously into the thigh, if needed to treat anaphylaxis 3 each 1    Labetalol HCl 100 MG Oral Tab Take 100 mg by mouth.      Lidocaine 5 % External Patch Apply 1 patch onto the skin daily. Apply to painful area for up to 12 hours in a 24 hour period. 30 patch 10    Lisinopril 20 MG Oral Tab Take 1 tablet (20 mg) by mouth every 12 hours. 180 tablet 3    predniSONE 20 MG Oral Tab Take 20 mg by mouth.      Pregabalin 100 MG Oral Cap Take 1 capsule (100 mg) by mouth 2 times a day. 180 capsule 1  Pregabalin 25 MG Oral Cap Will be at goal of 125 bid , by increasing by 50 per day per week until total 125 total (Patient taking differently: 100 mg every morning. Will be at goal of 125 bid , by increasing by 50 per day per week until total 125 total) 180 capsule 1    Primidone 50 MG Oral Tab Take 1 tablet (50 mg) by mouth 2 times a day. 180 tablet 1    THIAMINE HCL OR       triamcinolone 0.1 % External Cream As needed to right ear bid x 1 week 1 Tube 0     No facility-administered medications prior to visit.              REVIEW OF SYSTEMS - are otherwise negative unless as noted in HPI above or as additional issues below  Constitutional:     Eyes:    Head ears, nose mouth throat:   Cardiovascular:   Respiratory:   Gastrointestinal:   Genitourinary:    Musculoskeletal:    Integumentary:   Neurological:   Psychiatric/Behavioral:     Endocrine:   Hematological:   Allergic/Immunologic:     PHYSICAL EXAM  BP 110/70    Pulse 86    Temp 98.4 F (36.9 C) (Oral)    Resp 16    Ht 6' 3.98" (1.93 m)    Wt (!) 222 lb (100.7 kg)    SpO2 97%    BMI 27.03 kg/m   Well Dressed     Constitution, In general looks:   well , good coloring  No diaphoresis  Patient is Alert  &  Attentive  Patient is conversive    Psychiatric Mental Status / Neurologic Mental Status:  Affect:  Pleasant calm  Mood:  Good  Awake: Alert and attentive    Not confused  Thought::     content : no gross issue   Process : logical  Language:   Speech is fluent  Intact comprehension    Eyes: no discharge, no gross puffiness, non- icteric    Neck:    Thyroid not enlarged  2+ carotid pulses no bruits  Lymph nodes:  No gross cervical swelling  No gross axillary swelling    Cardiovascular:   Heart Regular rate , rhythm, no murmur  No edema  Respiratory:   Not tachyphpnic  Clear to ascultation  No rales, rhonchi , without increased effort or stridor  No clubbing     Abdomen / Gastrointestinal:  Soft. No masses. No guarding. No rebound  No gross bulge or hernia. Abdomen, groin  No bruit or pulsatile mass  Genitourinary:  No suprapubic tenderness  No increased flank pain with percussion    Neurologic:   Grossly intact  Memory:   Attention       Cranial Nerve    Eyes pupil and lid are intact   II, III, IV, VIII   Facial Movement VII: grossly intact, no facial droop     Motor- Gait-coordination: get up and go ok     Musculoskeletal:  Toe  Red  Pad metatarsal he said has been swollen the whole week but better               Skin:  No gross lesion or rash    Hematologic/Lymphatic /Immunologic:  No gross bruising or rash or hives     ++++++++++++++++++++++++++++++++++++++++++++++++++  Allergy  Review of patient's allergies indicates:  Allergies   Allergen Reactions    Adhesives  Rash    Bee Venom Hives, Itching and Swelling    Gabapentin      Flu like symptoms, diarrhea, body ache upset stomach       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]     URI (upper respiratory infection) [J06.9]     Greater trochanteric pain syndrome of right lower extremity [M25.551] 11/25/2017     Complex tear of medial meniscus of left knee as current injury [S83.232A] 09/08/2017    Primary osteoarthritis of left knee [M17.12] 09/08/2017    Right lumbar radiculitis [M54.16] 05/05/2017    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] 01/11/2017     Osteoarthritis of right hip x ray report - moderate to advanced radiology read 01/11/2017      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] 08/06/2016    Impaired mobility and ADLs [Z74.09] 04/08/2016    Difficulty in walking, not elsewhere classified [R26.2] 03/12/2016    Vitamin D deficiency [E55.9] 02/13/2016    Balance problem [R26.89] 01/23/2016    Chronic pain of both shoulders [M25.511, G89.29, M25.512] 01/23/2016     X ray arthritis  02/19/2016        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) [G91.2] 01/15/2016    Intraparenchymal hemorrhage of brain (HCC) [I61.9] 01/06/2016    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, 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, 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] 01/06/2016    Essential hypertension [I10] 12/16/2015    Essential tremor [G25.0] 12/16/2015    Hx of spontan intraparenchymal intracran bleed assoc with hypertension [Z86.79] 12/16/2015     [place] 4/29- 12/10/2015  Non-traumatic intraparenchymal hemorrhage, hypertensive of the left thalamus   Traumatic hemorrhage   -- falcine subdural hemorrhage, small cortical subarachnoid      Alcohol abuse [F10.10] 12/16/2015    Cerebral ventriculomegaly [G93.89] 10/09/2015    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    History of alcohol dependence (HCC) [F10.21] 06/26/2015    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) [G37.9] 05/07/2015    Idiopathic peripheral neuropathy [G60.9] 03/15/2015    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] 05/28/2014    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] 01/18/2014    Tremor [R25.1] 04/20/2013    Fall [W19.XXXA] 02/23/2013     BP < 100  Occasioanal  tri[p and leg weakness      Back pain, lumbosacral [M54.5] 02/15/2013    Lumbar radiculopathy, chronic [M54.16] 02/15/2013    Neck pain [M54.2] 02/15/2013    Chronic pain [G89.29] 12/21/2012     Now at [facility], DO, Santiago Glad    Neck pain -burn c3-7 on left side per patient  Some trigger injection    Lumbar pain-since 80's better with exercise      Bee sting allergy [Z91.030] 12/21/2012     hives      Numbness and tingling of leg [R20.0, R20.2] 12/21/2012     Left calf -left foot long term suspect from back-suspect L 45      Chronic back pain [M54.9, G89.29] 12/21/2012     Mri in mid scape 1/214  Multilevel degenerative disc disease of the lumbar spine. Circumferential disc bulge at all levels below and including L1-2. No significant central spinal canal stenosis or neuroforaminal narrowing.  2. Mild retrolisthesis of L5 on S1.  B12 deficiency [E53.8] 12/21/2012     Borderline low 5 /21/2014      Chronic renal insufficiency, stage III (moderate) (HCC) [N18.3] 12/21/2012     5/ 2013      Hyperlipidemia [E78.5] 09/26/2012    Cervicalgia [M54.2] 03/11/2010     Radiofrequency ablation of c3 to c 7           Past Medical History:   Diagnosis Date    Carotid Sinus Hypersensitivity     Disc disorder of lumbosacral region     Hip injury     HYPERTENSION      HYPOTENSION      Intraparenchymal hemorrhage of brain (HCC) 2017    Pain of right hip joint 01/23/2016    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 myelopathy or radiculopathy 05/28/2014    Syncope     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(HCC)     URI (upper respiratory infection)        Social History     Socioeconomic History    Marital status: Married     Spouse name: Not on file    Number of children: Not on file    Years of education: Not on file    Highest education level: Not on file   Occupational History    Not on file   Social Needs    Financial resource strain: Not on file    Food insecurity:     Worry: Not on file      Inability: Not on file    Transportation needs:     Medical: Not on file     Non-medical: Not on file   Tobacco Use    Smoking status: Never Smoker    Smokeless tobacco: Former Neurosurgeon   Substance and Sexual Activity    Alcohol use: Yes    Drug use: No    Sexual activity: Not on file   Lifestyle    Physical activity:     Days per week: Not on file     Minutes per session: Not on file    Stress: Not on file   Relationships    Social connections:     Talks on phone: Not on file     Gets together: Not on file     Attends religious service: Not on file     Active member of club or organization: Not on file     Attends meetings of clubs or organizations: Not on file     Relationship status: Not on file    Intimate partner violence:     Fear of current or ex partner: Not on file     Emotionally abused: Not on file     Physically abused: Not on file     Forced sexual activity: Not on file   Other Topics Concern    Not on file   Social History Narrative    ** Merged History Encounter **              Family History     Problem (# of Occurrences) Relation (Name,Age of Onset)    Colon Cancer (1) Father    Heart (other) (1) Mother: MI    Other Family Hx (1) Other: myelodysplasia          Social History     Socioeconomic History    Marital status: Married     Spouse name: Not on file    Number of children: Not on file    Years of  education: Not on file    Highest education level: Not on file   Occupational History    Not on file   Social Needs    Financial resource strain: Not on file    Food insecurity:     Worry: Not on file     Inability: Not on file    Transportation needs:     Medical: Not on file     Non-medical: Not on file   Tobacco Use    Smoking status: Never Smoker    Smokeless tobacco: Former NeurosurgeonUser   Substance and Sexual Activity    Alcohol use: Yes    Drug use: No    Sexual activity: Not on file   Lifestyle    Physical activity:     Days per week: Not on file     Minutes per session: Not on file     Stress: Not on file   Relationships    Social connections:     Talks on phone: Not on file     Gets together: Not on file     Attends religious service: Not on file     Active member of club or organization: Not on file     Attends meetings of clubs or organizations: Not on file     Relationship status: Not on file    Intimate partner violence:     Fear of current or ex partner: Not on file     Emotionally abused: Not on file     Physically abused: Not on file     Forced sexual activity: Not on file   Other Topics Concern    Not on file   Social History Narrative    ** Merged History Encounter **                      [facility]  [address]  [place] [id number]    [phone]  Fax   [phone]    First building on the right as you enter the campus

## 2018-03-14 ENCOUNTER — Ambulatory Visit (HOSPITAL_BASED_OUTPATIENT_CLINIC_OR_DEPARTMENT_OTHER): Payer: PPO

## 2018-03-14 ENCOUNTER — Ambulatory Visit: Payer: PPO | Attending: Internal Medicine

## 2018-03-14 ENCOUNTER — Encounter (INDEPENDENT_AMBULATORY_CARE_PROVIDER_SITE_OTHER): Payer: Self-pay | Admitting: Internal Medicine

## 2018-03-14 ENCOUNTER — Ambulatory Visit: Payer: PPO | Attending: Physical Medicine & Rehabilitation | Admitting: Physical Medicine & Rehabilitation

## 2018-03-14 ENCOUNTER — Encounter (HOSPITAL_BASED_OUTPATIENT_CLINIC_OR_DEPARTMENT_OTHER): Payer: Self-pay | Admitting: Physical Medicine & Rehabilitation

## 2018-03-14 VITALS — BP 115/72 | HR 89 | Ht 73.62 in | Wt 224.0 lb

## 2018-03-14 DIAGNOSIS — Z6829 Body mass index (BMI) 29.0-29.9, adult: Secondary | ICD-10-CM

## 2018-03-14 DIAGNOSIS — M7989 Other specified soft tissue disorders: Secondary | ICD-10-CM

## 2018-03-14 DIAGNOSIS — M545 Low back pain, unspecified: Secondary | ICD-10-CM

## 2018-03-14 DIAGNOSIS — M25571 Pain in right ankle and joints of right foot: Secondary | ICD-10-CM

## 2018-03-14 DIAGNOSIS — M79671 Pain in right foot: Secondary | ICD-10-CM

## 2018-03-14 LAB — COMPREHENSIVE METABOLIC PANEL
ALT (GPT): 19 U/L (ref 10–48)
AST (GOT): 22 U/L (ref 9–38)
Albumin: 4.4 g/dL (ref 3.5–5.2)
Alkaline Phosphatase (Total): 89 U/L (ref 36–161)
Anion Gap: 8 (ref 4–12)
Bilirubin (Total): 0.4 mg/dL (ref 0.2–1.3)
Calcium: 9.1 mg/dL (ref 8.9–10.2)
Carbon Dioxide, Total: 28 meq/L (ref 22–32)
Chloride: 103 meq/L (ref 98–108)
Creatinine: 1.1 mg/dL (ref 0.51–1.18)
GFR, Calc, African American: >60 mL/min/{1.73_m2} (ref >59)
GFR, Calc, European American: >60 mL/min/{1.73_m2} (ref >59)
Glucose: 79 mg/dL (ref 62–125)
Potassium: 5.2 meq/L (ref 3.6–5.2)
Protein (Total): 6.6 g/dL (ref 6.0–8.2)
Sodium: 139 meq/L (ref 135–145)
Urea Nitrogen: 28 mg/dL — ABNORMAL HIGH (ref 8–21)

## 2018-03-14 LAB — CBC, DIFF
% Basophils: 1 %
% Eosinophils: 2 %
% Immature Granulocytes: 1 %
% Lymphocytes: 23 %
% Monocytes: 10 %
% Neutrophils: 63 %
% Nucleated RBC: 0 %
Absolute Eosinophil Count: 0.13 10*3/uL (ref 0.00–0.50)
Absolute Lymphocyte Count: 1.31 10*3/uL (ref 1.00–4.80)
Basophils: 0.07 10*3/uL (ref 0.00–0.20)
Hematocrit: 46 % (ref 38–50)
Hemoglobin: 14.5 g/dL (ref 13.0–18.0)
Immature Granulocytes: 0.03 10*3/uL (ref 0.00–0.05)
MCH: 34.4 pg — ABNORMAL HIGH (ref 27.3–33.6)
MCHC: 31.8 g/dL — ABNORMAL LOW (ref 32.2–36.5)
MCV: 108 fL — ABNORMAL HIGH (ref 81–98)
Monocytes: 0.57 10*3/uL (ref 0.00–0.80)
Neutrophils: 3.71 10*3/uL (ref 1.80–7.00)
Nucleated RBC: 0 10*3/uL
Platelet Count: 217 10*3/uL (ref 150–400)
RBC: 4.22 10*6/uL — ABNORMAL LOW (ref 4.40–5.60)
RDW-CV: 13 % (ref 11.6–14.4)
WBC: 5.82 10*3/uL (ref 4.3–10.0)

## 2018-03-14 LAB — URIC ACID, SERUM: Uric Acid: 7.7 mg/dL — ABNORMAL HIGH (ref 3.9–7.6)

## 2018-03-14 MED ORDER — DICLOFENAC SODIUM 75 MG OR TBEC
75.0000 mg | DELAYED_RELEASE_TABLET | Freq: Two times a day (BID) | ORAL | 3 refills | Status: DC
Start: 2018-03-14 — End: 2018-06-21

## 2018-03-14 NOTE — Patient Instructions (Signed)
Ask Dr. Marisa SprinklesKraft if ok to take oral diclofenac 75 mg twice a day for just 2 weeks  In your chart there is a notation of "Chronic Kidney Disease, Stage III"-- ask her if this is the case. If it is, then do not take diclofenac    Order was placed for a repeat Epidural steroid injection (LEFT L5).  Prior to the injection, would need to stop ORAL diclofenac if on this.     Park LiterBrian Zorianna Taliaferro, MD, CAQ Sports Medicine, [facility]  Attending Physician  Department of Rehabilitation Medicine  Division of Sports, Spine and Orthopedic Health  [facility]  [phone], Option #2

## 2018-03-14 NOTE — Progress Notes (Signed)
CC: New issue of acute left sided low back pain    HPI:  Ronnie Mason is a very pleasant 67 year old man I last saw in clinic on 11-25-17 mainly for right hip pain felt to be due to comminution of greater trochanteric pain syndrome versus hip osteoarthritis.    He comes in with a new issue today of left-sided low back pain.  Recall I treated him in the past for back pain and we have performed 2 epidural steroid injections.  About 2 months ago and mid June he was on his property when his right leg fell into a rabbit hole and he then fell statically onto his left side.  Is then he has had pain in his low back on the left side but nothing on the right.  No radiation of pain down his legs.  Pain 7 out of 10, sharp, stabbing and constant nature and worse with walking and bending.  Symptoms are better with heat.  Denies any bowel or bladder incontinence, saddle anesthesia, weakness in the lower extremity.  He has not had had any formal treatment for this.  He is connected with a physical therapist but has not been able to get in because of a busy schedule.      Recently he has been treated for a right foot swelling with oral prednisone for 5 days and noted no change in his symptoms with the prednisone.    Past medical, past surgical, social, family hx: Reviewed with patient and is: He just completed building his house.    Medications:  Reviewed  as above    ROS:  A complete review of systems is negative except for what is documented in the HPI above and what is noted here:  No changes since last visit.     Physical Exam  Gen: In no acute distress.  Psych: Normal mood.  HEENT: EOMI. Normal cephalic, atruamatic  CVS: No clubbing, cyanosis, edema  Pulm: Non-labored respirations  Skin: Moist, no lesions  Neuro: Gait is non-ataxic. Strength 5/5. Sensation intact to LT. No clonus or Babinski's  MSK:   Stiffness in the lumbar spine worsened with lumbar extension and facet loading.  Lumbar flexion today improved some of his  symptoms  Mildly tender to palpation lumbar paraspinals    Imaging/Diagnostic studies:  MRI lumbar spine 08-10-17 showing multilevel degenerative disc disease.  Mild foraminal narrowing bilaterally      Procedures  Bilateral L5 TFESI 09-15-17 with 50-75% relief  RIGHT L5 TFESI 05-19-17 with 100% relief of symptoms for about 3 months    Impression:  67 year old man with acute left-sided low back pain without radicular symptoms since January 14, 2018, at this time likely exacerbation of underlying degenerative disc disease at L4 to L5 and L5 to S1    Discussion/Plan:   [name]'s current low back symptoms is axial in nature.  He had an acute injury.  Has no radicular symptoms.  Continues to have a normal neurologic examination.  He has not yet gotten into any physical therapy but is connected to a physical therapist.  He recently took a 5 day oral prednisone course mainly for his right foot swelling and noted no improvement in his low back with this oral medication.  He came in asking today about the possibility of a repeat epidural steroid injection.  We discussed the pros and cons of this and discuss of indications typically more for radicular pain which she does not have.  However this is a reasonable option to  consider given that he has had injections the past and done well and additionally he might be limited in regards to medications that he can take such as oral NSAIDs, since he apparently has been told by his primary care physician in the past but to take these.  The main reason he may not build to take these is that he has a notation of chronic kidney disease in his history.    - Prescribed diclofenac oral 75 mg BID for 2 weeks.  Prior to starting this I have asked him to speak with his primary care physician to see if this is okay.  He has an irritation of chronic kidney disease in his medical history which she is not aware about.  His last known creatinine was 1.10.  If he is not allowed to take this and he will  continue with acetaminophen  - He will set up appointments with his physical therapist is seeking get into work on his low back  - Also plan for a left L5 transforaminal epidural steroid injection with dexamethasone.  Certainly if he takes the diclofenac and his symptoms improve and there is no need to proceed with the injection        Total time of 25 minutes was spent facet-to-face with the patient, of which, more than 50% was spent counseling and coordinating the patient's care as outlined above          Park LiterBrian Suly Vukelich, MD, CAQ Sports Medicine, [facility]  Attending Physician  Department of Rehabilitation Medicine  Division of Sports, Spine and Orthopedic Health  [facility]  [phone], Option #2

## 2018-03-14 NOTE — Result Encounter Note (Signed)
The uric acid is on the slightly higher side and would like you to follow this up with podiatry I'm not sure if this is possibly a diagnosis of gout versus arthritis    Chemistry is okay  Macrocytosis continues  I would think that if  off alcohol this should get better    However it is quite significant  And if it continues this way the next step is to see a hematologist      Office Visit on 03/09/18  1. Uric Acid       Result                                            Value                         Ref Range                       Uric Acid                                         7.7 (H)                       3.9 - 7.6 mg/dL            2. CBC with Differential       Result                                            Value                         Ref Range                       WBC                                               5.82                          4.3 - 10.0 10*3/uL              RBC                                               4.22 (L)                      4.40 - 5.60 10*6/uL             Hemoglobin                                        14.5  13.0 - 18.0 g/dL                Hematocrit                                        46                            38 - 50 %                       MCV                                               108 (H)                       81 - 98 fL                      MCH                                               34.4 (H)                      27.3 - 33.6 pg                  MCHC                                              31.8 (L)                      32.2 - 36.5 g/dL                Platelet Count                                    217                           150 - 400 10*3/uL               RDW-CV                                            13.0                          11.6 - 14.4 %                   % Neutrophils  63                            %                               % Lymphocytes                                      23                            %                               % Monocytes                                       10                            %                               % Eosinophils                                     2                             %                               % Basophils                                       1                             %                               % Immature Granulocytes                           1                             %                               Neutrophils                                       3.71  1.80 - 7.00 10*3/uL             Absolute Lymphocyte Count                         1.31                          1.00 - 4.80 10*3/uL             Monocytes                                         0.57                          0.00 - 0.80 10*3/uL             Absolute Eosinophil Count                         0.13                          0.00 - 0.50 10*3/uL             Basophils                                         0.07                          0.00 - 0.20 10*3/uL             Immature Granulocytes                             0.03                          0.00 - 0.05 10*3/uL             Nucleated RBC                                     0.00                          0.00 10*3/uL                    % Nucleated RBC                                   0                             %                          3. Comprehensive Metabolic Panel       Result  Value                         Ref Range                       Sodium                                            139                           135 - 145 meq/L                 Potassium                                         5.2                           3.6 - 5.2 meq/L                 Chloride                                          103                           98 - 108 meq/L                  Carbon Dioxide, Total                             28                             22 - 32 meq/L                   Anion Gap                                         8                             4 - 12                          Glucose                                           79                            62 - 125 mg/dL                  Urea Nitrogen  28 (H)                        8 - 21 mg/dL                    Creatinine                                        1.10                          0.51 - 1.18 mg/dL               Protein (Total)                                   6.6                           6.0 - 8.2 g/dL                  Albumin                                           4.4                           3.5 - 5.2 g/dL                  Bilirubin (Total)                                 0.4                           0.2 - 1.3 mg/dL                 Calcium                                           9.1                           8.9 - 10.2 mg/dL                AST (GOT)                                         22                            9 - 38 U/L                      Alkaline Phosphatase (Total)                      89  36 - 161 U/L                    ALT (GPT)                                         19                            10 - 48 U/L                     GFR, Calc, European American                      >60                           >59 mL/min/[1.73_m2]            GFR, Calc, African American                       >60                           >59 mL/min/[1.73_m2]            GFR, Information                                                                                            Calculated GFR in mL/min/1.73 m2 by MDRD equation.  Inaccurate with changing renal function.  See [email or web address]

## 2018-03-22 ENCOUNTER — Ambulatory Visit (INDEPENDENT_AMBULATORY_CARE_PROVIDER_SITE_OTHER): Payer: PPO | Admitting: Podiatrist

## 2018-03-22 DIAGNOSIS — M7671 Peroneal tendinitis, right leg: Secondary | ICD-10-CM

## 2018-03-22 DIAGNOSIS — M10071 Idiopathic gout, right ankle and foot: Secondary | ICD-10-CM

## 2018-03-22 MED ORDER — COLCHICINE 0.6 MG OR TABS
ORAL_TABLET | ORAL | 0 refills | Status: DC
Start: 2018-03-22 — End: 2018-06-21

## 2018-03-22 MED ORDER — METHYLPREDNISOLONE ACETATE 40 MG/ML IJ SUSP
20.0000 mg | Freq: Once | INTRAMUSCULAR | Status: AC
Start: 2018-03-22 — End: 2018-03-22
  Administered 2018-03-22: 20 mg via INTRA_ARTICULAR

## 2018-03-22 NOTE — Patient Instructions (Signed)
Low-Purine Diet (Anti-GOUT Diet)     Purines ([id number]) are found in many foods, especially organ meats, anchovies, mackerel, and sardines. Purines make up 15 percent of the uric acid found in the body. Too much uric acid can lead to problems, including kidney stones and gout. For this reason, a low-purine diet is recommended for people who have kidney stones, gout, and sometimes for people who have had an organ transplant. The diet is often used with medication to lower uric acid levels. Some people can lower their uric acid levels through diet alone.  Not everyone needs to follow a rigid diet to treat gout, but avoiding foods that are high in purines may help. Check with your doctor or dietitian to see if you should follow this diet.   IMPORTANT POINTS TO KEEP IN MIND  • Avoid high-purine foods. See below lists.  • Avoid or limit alcohol. Alcohol increases purine production, leading to higher uric acid levels in your blood and urine. (especially beer) 1 drink 3 times a wk.  • Limit meat to 3 ounces per meal.  • Limit high-fat foods such as salad dressings, ice cream, fried foods, gravies, and dressings.  Fat holds onto uric acid in your kidneys.  • Eat enough carbohydrates. They help your body get rid of extra uric acid.  • If you are overweight, lose weight gradually.  Rapid weight loss can increase uric acid levels.  • Drink 8 to 12 cups of fluid every day to help reduce kidney stone formation.  • Don't take baker's or brewer's yeast as a supplement.     Breads, Cereals, Rice, and Pasta  Foods to Choose  - all enriched breads, cereals, rice, noodles, pasta, and     potatoes  - limit to 2 servings per week: whole-grain breads and     cereals,     wheat germ, bran, and oatmeal  Foods to Avoid  - limit high-fat breads like pancakes, French toast,     biscuits, muffins, and french fries    Meats, Poultry, Fish, Dried Beans, Peas, Eggs, and Cheese  Foods to Choose  - beef, lamb, veal, pork, poultry, fish, eggs,  peanut butter,     nuts, and low-fat cheese - limit to 4 -6 oz  (114-170gm)/day  - limit to 2 servings per week: dried peas and beans  Foods to Avoid  Sweetbreads, anchovies, sardines, liver, kidneys, brains, meat extracts, herring, mackerel, scallops, gravies, goose, heart, mincemeat, and mussels.    Fats, Condiments, and Drinks   Foods to Choose  - salt, herbs, spices, and condiments  - carbonated drinks, coffee, cocoa, and tea  - soups made with low-fat milk and vegetable-based broth  - limit to 3 teaspoons daily: butter, margarine, oils, and       mayonnaise  Foods to Avoid  - meat gravies, baker's and brewer's yeast, alcohol, and     meat stock-based soups (such as bouillon, broth, and     Consommé). Limit alcohol 1 drink 3 times a week. Milk and Dairy Products  Foods to Choose  - skim or low-fat milk  - low-fat yogurt  Foods to Avoid  - whole milk, cream, and sour cream    Fruits  Foods to Choose  - all fruit and juices  Foods to Avoid  - limit avocados (high in fat)    Vegetables  Foods to Choose  - all  - limit to 2 servings per week: mushrooms, dried peas       and beans, spinach, asparagus, cauliflower  Foods to Avoid  - limit high-fat cooking, including au gratin, fried foods,     and cream sauces    Snacks, Sweets, and Desserts   Foods to Choose  - gelatin, ice milk, vanilla wafers, angel food cake  - low-fat frozen yogurt  Foods to Avoid  - high-fat desserts such as ice cream, cookies, cakes, pies,     doughnuts, and chocolate  - mince meat pie         If there are any questions, please ask your physician or dietitian.  Information obtained from [facility]

## 2018-03-22 NOTE — Progress Notes (Addendum)
NEW PATIENT EVALUATION    Patient: Ronnie Mason   Patient DOB: May 29, 1951     DOS:  03/22/2018     Accompanied by:  patient was unaccompanied    Chief Complaint: Mr. Kyzen Horn is a 67 year old year old male who presents today with complaint(s):   Chief Complaint   Patient presents with    Foot Pain     right foot, big toe issue, seen dr Frederic Jericho pt        History of present Illness:     The location of the condition is lateral ankle, right  The patient states that the condition has existed for 2.  It began traumatically - step in rebit hole.   The precipitating event was twist ankle.  Patient describes the symptoms as swollen.  Patient denies:  allodynia.  The quality of pain aching.  The level the pain mild  The condition is improving - 75 %.  The affected area is made worse by ambulating.  Past treatments/studies include cast boot - tall, cast boot - short, ice and rest.  The patient states moderate improvement with treatment .  Previous diagnostic test/evaluation:  Clinic Shawnie Dapper and x-ray.  Patient's limitation is activities are limited in walking.    Goals: evaluate current condition.    The location of the condition is right big toe issue, seen mcinnes. Had surgery in feb 2019  In June feel in rabbit hole, very painful, still swelling, then got ? Gout. Ankle issue  The patient states that the condition has existed for 1, year(s) .  It began gradually. Bone spur , had surgery  The precipitating event was unknown.  Patient describes the symptoms as   Patient denies:  any other symptoms.  The quality of pain aches.  The level the pain mild  The condition is comes and goes.  The affected area is made worse by active, hills, .  Past treatments/studies include had surgery.  The patient states temporary relief with treatment .  Previous diagnostic test/evaluation:  shoe wear etc.  Patient's limitation is activities are limited in walking and limits activities of daily living.stairs    Goals: evaluate  current condition, walk without pain and exercise without pain.    PCP:  [name], MD     PMH:   Past Medical History:   Diagnosis Date    Carotid Sinus Hypersensitivity     Disc disorder of lumbosacral region     Hip injury     HYPERTENSION      HYPOTENSION      Intraparenchymal hemorrhage of brain (HCC) 2017    Pain of right hip joint 01/23/2016    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 myelopathy or radiculopathy 05/28/2014    Syncope     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(HCC)     URI (upper respiratory infection)         Review of patient's allergies indicates:  Allergies   Allergen Reactions    Adhesives Rash    Bee Venom Hives, Itching and Swelling    Gabapentin      Flu like symptoms, diarrhea, body ache upset stomach        Medications:   Outpatient Medications Prior to Visit   Medication Sig Dispense Refill    Acetaminophen 500 MG Oral Tab 2 tablets bid      amLODIPine 5 MG Oral Tab Take 1 tablet (5 mg) by mouth 2 times a day. 90 tablet 1  Atorvastatin Calcium 10 MG Oral Tab Take 1 tablet (10 mg) by mouth daily. 90 tablet 2    Cholecalciferol (VITAMIN D3) 2000 units Oral Cap Take 1 capsule (2,000 Units) by mouth daily. For low level 1 capsule 1    Cyanocobalamin 1000 MCG Oral Tab one per day over-the-counter started 5/21 /2012 this level borderline low 1 Tab 1    diclofenac (VOLTAREN) 1 % Transdermal Gel Apply 2 g topically 4 times a day. Apply to neck, trapezius muscle, and low back. Toe  No more than 32 g applied per day total 3 Tube 4    diclofenac 75 MG Oral Tab EC Take 1 tablet (75 mg) by mouth 2 times a day. 60 tablet 3    DULoxetine HCl 30 MG Oral CAPSULE ENTERIC COATED PARTICLES Take 1 capsule (30 mg) by mouth daily. 90 capsule 1    DULoxetine HCl 60 MG Oral CAPSULE ENTERIC COATED PARTICLES Take 1 capsule (60 mg) by mouth daily. 90 capsule 1    EPINEPHrine 0.3 MG/0.3ML Injection Solution Auto-injector Inject as instructed per patient package insert, 0.3 mg intramuscularly  or subcutaneously into the thigh, if needed to treat anaphylaxis 3 each 1    Labetalol HCl 100 MG Oral Tab Take 100 mg by mouth.      Lidocaine 5 % External Patch Apply 1 patch onto the skin daily. Apply to painful area for up to 12 hours in a 24 hour period. 30 patch 10    Lisinopril 20 MG Oral Tab Take 1 tablet (20 mg) by mouth every 12 hours. 180 tablet 3    predniSONE 20 MG Oral Tab Take 20 mg by mouth.      pregabalin 100 MG Oral Cap Take 1 capsule (100 mg) by mouth 2 times a day. 180 capsule 1    pregabalin 50 MG Oral Cap 1 po bid 180 capsule 1    Primidone 50 MG Oral Tab Take 1 tablet (50 mg) by mouth 2 times a day. 180 tablet 1    THIAMINE HCL OR       triamcinolone 0.1 % External Cream As needed to right ear bid x 1 week 1 Tube 0     No facility-administered medications prior to visit.         PSH:   Past Surgical History:   Procedure Laterality Date    ANES; COLONOSCOPY  2002    repeat in 3 years    ANES; COLONOSCOPY & POLYPECTOMY  11/18/2007    repeat in 3 years    HALLUX RIGIDUS W/CHEILECTOMY 1ST MP JT W/O IMPLT Right 10/21/2017    Dr. Donn PieriniBrian McInnes    L meniciscus Left 07/2017    by Dr Marcelle OverlieHolland , re did left menisicus     UNLISTED PROCEDURE FEMUR/KNEE      UNLISTED PROCEDURE HANDS/FINGERS      UNLISTED PROCEDURE SPINE  2013    rfa to c3 to c 7         Family History:  family history includes Colon Cancer in his father; Heart (other) in his mother; Other Family Hx in an other family member.    Social History:  Social History     Socioeconomic History    Marital status: Married     Spouse name: Not on file    Number of children: Not on file    Years of education: Not on file    Highest education level: Not on file   Occupational History  Not on file   Social Needs    Financial resource strain: Not on file    Food insecurity:     Worry: Not on file     Inability: Not on file    Transportation needs:     Medical: Not on file     Non-medical: Not on file   Tobacco Use    Smoking  status: Never Smoker    Smokeless tobacco: Former NeurosurgeonUser   Substance and Sexual Activity    Alcohol use: Yes    Drug use: No    Sexual activity: Not on file   Lifestyle    Physical activity:     Days per week: Not on file     Minutes per session: Not on file    Stress: Not on file   Relationships    Social connections:     Talks on phone: Not on file     Gets together: Not on file     Attends religious service: Not on file     Active member of club or organization: Not on file     Attends meetings of clubs or organizations: Not on file     Relationship status: Not on file    Intimate partner violence:     Fear of current or ex partner: Not on file     Emotionally abused: Not on file     Physically abused: Not on file     Forced sexual activity: Not on file   Other Topics Concern    Not on file   Social History Narrative    ** Merged History Encounter **             Review of Systems   Constitutional: Negative    Eyes: Deferred   Ears, Nose, Mouth, Throat: Deferred   Cardiovascular: Negative    Respiratory: Negative    Gastrointestinal: Negative   Genitourinary: Deferred   Skin: Negative    Neurological: Negative    Psychiatric: Deferred   Endocrine: Negative    Hematologic/Lymphatic: Negative   Allergic/Immunologic: Deferred     ROS is positive for musculoskeletal as mentioned in the HPI and as stated in the past medical history.  The patient's review of symptoms is otherwise unremarkable and documented in the electronic health record on 03/22/2018.    Physical Exam:   There were no vitals filed for this visit.     General: The patient is Not Hispanic or Latino, Well developed, appearing stated age and in no acute distress  Pleasant, well-developed, well-nourished. Alert and oriented to person, place, and time.  No apparent distress.    Vascular:   RIGHT:  Dorsalis pedis pulse is palpable.  Posterior tibial pulse is palpable. Capillary filling time note to be immediate  on the distal hallux.      LEFT:   Dorsalis pedis pulse is palpable.  Posterior tibial pulse is palpable. Capillary filling time note to be immediate  on the distal hallux.      Neurologic:  RIGHT:  The sensation is intact on the foot/ankle.  Motor coordination is intact with normal muscle tone.  There is no manifestations of pathological reflexes noted.     LEFT:  The sensation is intact on the foot/ankle.  Motor coordination is intact with normal muscle tone.  There is no manifestations of pathological reflexes noted.      Dermatological:   RIGHT:  The skin has normal texture, tone and turgor.  There is no ulceration/open  wound or dermatosis present.  Integument coloration and temperature are within normal range.    LEFT:  The skin has normal texture, tone and turgor.  There is no ulceration/open wound or dermatosis present.  Integument coloration and temperature are within normal range.    Musculoskeletal:   RIGHT:  There is mild pain on palpation of the.peroneal tendons inferior to lateral malleolus region  There is Mild swelling along the tendon.  There is no warmth to touch and ecchymosis in the tendon of the affected area.   There is moderated on palpation along the dorsal, plantar, 1st metatarso-phalangeal joint.  There is swelling noted over the affect region. There is warmth to touch of the affected area.              LEFT:  There is no deformities, manual muscle testing 5/5 all extrinsic muscles to the foot, ROM within normal limits and pain free, no pain to palpation.    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  We explained how condition possibly occurred.  We discuss that this condition can return, even with current treatment.   We  did explain to the patient that gout is a system condition and patient may want to follow up with primary physician for evaluation.  Current options are no treatment,continue current care,low purine diet, Colcrys, steroid injection, see PCP regards to allopurinal.  The patient elected to try,low purine diet,Colcrys steroid injection.    A corticosteroid injection was administered to plantar 1st metatarso-phalangeal joint, right was performed this date to reduce symptoms.  The involved joint was prepped with Betadine and ethyl chloride was used as a topical anesthetic.  2% Lidocaine with Depomedrol(20mg ) was then administered.  The patient was cautioned regarding hypopigmentation, fat atrophy and steroid flare.   We recommend that patient take NSAID and ice on the injection for couple days.   He had complete relief.   I have RX Colcrys.      We have reviewed previous X-ray finding and detail discussion took place with the patient.        (M76.71) Peroneal tendinitis, right s/p ankle sprain - discuss about possible peroneal tendon tear.    Plan: Improving and continue with PT and wear ankle brace/compression device.  If symptom continues, consider MRI.     Return appointment: No follow-ups on file.    TIME SPENT:  45 minutes was spent face-to-face with patient, greater than 50% of which was spent counseling and coordinating care, and it is separate from procedure  . They were given an opportunity to ask questions which were answered in a detailed manner.    [name] DPM, FACFAS  Reconstructive Ankle and Foot Surgeon  Ankle Joint Replacement Surgeon  Fellowship Trained

## 2018-03-22 NOTE — Progress Notes (Signed)
ATTENDING PROCEDURE ADDENDUM / ATTESTATION  The above procedure (LEFT L5 TFESI  ) was performed by Dr. Donzetta KohutSherry Igbinigie, Sports Medicine Fellow Physician. I was present for the entire procedure and directly supervised the procedure. I personally reviewed all available imaging and clinic notes prior to the procedure.     He had requested injection for the RIGHT side as well today. Unfortunately due to timing, we did not have time to accommodate this. Counseled him that the injection was an epidural and some of the medication will travel to the other side.     Park LiterBrian Waverly Chavarria, MD, CAQ Sports Medicine, [facility]APMR  Attending Physician  Department of Rehabilitation Medicine  Division of Sports, Spine and Orthopedic Health  [facility]  [phone], Option #2

## 2018-03-22 NOTE — Progress Notes (Signed)
The location of the condition is right big toe issue, seen mcinnes. Had surgery in feb 2019  In June feel in rabbit hole, very painful, still swelling, then got ? Gout. Ankle issue  The patient states that the condition has existed for 1, year(s) .  It began gradually. Bone spur , had surgery  The precipitating event was unknown.  Patient describes the symptoms as   Patient denies:  any other symptoms.  The quality of pain aches.  The level the pain mild  The condition is comes and goes.  The affected area is made worse by active, hills, .  Past treatments/studies include had surgery.  The patient states temporary relief with treatment .  Previous diagnostic test/evaluation:  shoe wear etc.  Patient's limitation is activities are limited in walking and limits activities of daily living.stairs    Goals: evaluate current condition, walk without pain and exercise without pain.

## 2018-03-22 NOTE — Progress Notes (Signed)
[facility]         Specializing in sports-related injuries and non-surgical care for conditions of the spine,  shoulder, elbow, wrist, hand, hip, knee, foot, and ankle.      03/23/2018    Ronnie Granaavid Paul Guinta  [id number]      PROCEDURE:  Fluoroscopic guided left L5 transforaminal epidural steroid injection    DIAGNOSIS/INDICATION: Ronnie Mason is a 67 year old male with left sided low back pain after a fall. Referred by Dr. Park LiterBrian Liem.  MRI from 08/10/16 demonstrated broad central disc bulge at L5-S1 with lateral recess narrowing worse on left than right, central and left lateral disc bulge at L4-5 with left foraminal narrowing, left paracentral disc bulge at L3-4 neuroforaminal narrowing and possibly abutting the exiting left L4 nerve root.  Please see clinic notes for additional information.      Allergies: is allergic to adhesives; bee venom; and gabapentin.  Allergies were reviewed with the patient. No contraindications were identified.      CONSENT:    Potential risks including pain, serious infection, paralysis, nerve injury, spinal headache, allergic reaction, and others were discussed with the patient. The patient was given and read a patient information sheet regarding injection procedures prior to todays injection. The patient understands the benefits, risks, and alternatives of the procedure and agrees to the procedure today.  Written informed consent was obtained.       PROCEDURE TECHNIQUE:    The patient was escorted to the procedure suite and positioned prone on the procedure table. A brief pause occurred prior to procedure in which final verification was performed. The skin overlying the area was prepped and draped in the usual sterile fashion using chlorhexidine.  Under fluoroscopic guidance the left L5 neuroforamen was located. Using a 25-gauge needle, the skin overlying the area was anesthetized with 1% lidocaine. Using intermittent fluoroscopic guidance, a 22 gauge 5  inch spinal needle was advanced to the region of the target nerve root.  A small amount of Omnipaque (iohexol) contrast was instilled through extension tubing to identify the nerve root sleeve and assure no vascular tracking using live fluoroscopy. This was followed by the injection of 1.0 cc 1% lidocaine and 1.5 cc of dexamethasone 10mg /ml. The needle was then flushed with 1% lidocaine and removed, the skin was cleansed, and a bandage placed over the injection site.  The patient was assisted to the seated position and then escorted to the post procedure recovery area in good condition.  The patient tolerated the procedure well.  Vital signs remained stable both pre- and post- procedure.   Complications: none  Pre-injection VAS: 7/10  Post-injection VAS: 0/10    POST-INJECTION PLAN  1.  The patient was given instructions regarding the use of ice and appropriate activity level over the next few weeks.  Patient was instructed to call my office immediately if there are any questions or problems.    2.  The patient will follow up in clinic with Dr. Park LiterBrian Liem in 2-3 weeks.     Procedure was supervised with attending, Dr. Jenetta DownerBrian Liem     Rajvi Armentor, MD  Sports Medicine Fellow  Department of Rehabilitation Medicine   [facility]

## 2018-03-23 ENCOUNTER — Ambulatory Visit: Payer: PPO | Attending: Physical Medicine & Rehabilitation | Admitting: Physical Medicine & Rehabilitation

## 2018-03-23 VITALS — BP 142/85 | HR 92 | Temp 97.5°F | Resp 12

## 2018-03-23 DIAGNOSIS — M545 Low back pain, unspecified: Secondary | ICD-10-CM

## 2018-03-23 MED ORDER — DEXAMETHASONE SODIUM PHOSPHATE 10 MG/ML IJ SOLN
15.0000 mg | Freq: Once | INTRAMUSCULAR | Status: AC
Start: 2018-03-23 — End: 2018-03-23
  Administered 2018-03-23: 15 mg via INTRALESIONAL

## 2018-03-23 NOTE — Patient Instructions (Signed)
PATIENT EDUCATION for INJECTION PROCEDURES    Post-Procedure Discharge Instructions    After the procedure you might experience:  . Immediate and complete pain relief is rare; sometimes a mild increase in pain can occur for several days after the procedure  . Numbness and/or weakness in the area of your body supplied by the injected nerve; these symptoms should resolve but may last up to several hours  . Some soreness and bruising at the injection site(s)    Activities:  . If you have any weakness or numbness caused by the injection, DO NOT DRIVE or operate machinery and limit other activity until sensation returns to normal.  . Activity should be limited to basic levels (no exercise or other heavy activity) on the day of the procedure  . Physical therapy and higher level exercise should be delayed for three days following the procedure    Medications:   If you stopped taking any blood thinning medications such as Coumadin or Plavix, you may resume these tomorrow unless specified differently by the prescribing physician.    Site care:  . You may remove the band-aid after 6 hours.  . You may shower today. No swimming, tub baths or hot tubs for 24 hours following your procedure.  . For the first 48 hours, apply ice packs to the injection site for 15-20 minutes hourly as needed for comfort.  Wrap a light towel or cloth around ice packs and heating pad to protect the skin.  . After 48 hours, use a warm heating pad to the injection site for 15-20 minutes hourly as needed for comfort.    If you have diabetes and you received steroids today:   Check your blood sugar more frequently than usual as you may develop an increase in blood sugar for the next 10-14 days. Contact your diabetes physician if this occurs.    Call us if you develop any of the following symptoms in the next 7 days:  . Fever above 100 degrees F     . Any unusual increase in your level of pain  . Swelling, bleeding, redness, or increased tenderness at  the procedure or IV site  . Headache not relieved by Tylenol (if you had an epidural steroid injection)    Follow-up: As instructed.    Contact Us    [facility]:  [phone], option 8 to speak to the nurse     Sports & Spine:  [phone], option 8    [facility]:   [phone]    Thank you for allowing us to participate in the management of your medical care.  Dr. Harrast, Dr. Krabak, Dr. Liem, Dr. Johnson and Dr. Sandhu                 POST-INJECTION EVALUATION    The following information needs to be completed and returned to our offices at your follow-up appointment.  You may be sore from the needles, so when rating your pain, please concentrate on your regular pain and not any soreness from the needle injection itself.          1-HOUR after the procedure, my pain level is (place an 'x' along the line below):                           No Pain                                                         Worst Pain Imaginable  0 1 2 3 4 5 6 7 8 9 10        2-HOURS after the procedure, my pain level is (place an 'x' along the line below):                 No Pain                                                                               Worst Pain Imaginable  0 1 2 3 4 5 6 7 8 9 10        3-HOURS after the procedure, my pain level is (place an 'x' along the line below):      No Pain                                                         Worst Pain Imaginable  0 1 2 3 4 5 6 7 8 9 10      4-HOURS after the procedure, my pain level is (place an 'x' along the line below):      No Pain              Worst Pain Imaginable  0 1 2 3 4 5 6 7 8 9 10      5-HOURS after the procedure, my pain level is (place an 'x' along the line below):       No Pain                                    Worst Pain Imaginable  0 1 2 3 4 5 6 7 8 9 10      24-HOURS after the procedure, my pain level is (place an 'x' along the line below):       No Pain               Worst Pain  Imaginable  0 1 2 3 4 5 6 7 8 9 10      2-DAYS after the procedure, my pain level is (place an 'x' along the line below):                            No Pain                                                                                                       Worst Pain Imaginable  0 1 2 3 4 5   6 7 8 9 10    3-DAYS after the procedure, my pain level is (place an 'x' along the line below):                No Pain                                                 Worst Pain Imaginable  0 1 2 3 4 5 6 7 8 9 10      1-WEEK after the procedure, my pain level is (place an 'x' along the line below):      No Pain                                                                        Worst Pain Imaginable  0 1 2 3 4 5 6 7 8 9 10      2-WEEKS after the procedure, my pain level is (place an 'x' along the line below):       No Pain                                               Worst Pain Imaginable  0 1 2 3 4 5 6 7 8 9 10            Thank you for your responses.    Please return this form to the Medical Assistant or your Physician.

## 2018-03-23 NOTE — Progress Notes (Signed)
[name] identified by name and DOB.   He confirmed he is having a Left L5 TFESI  He ambulated to procedure area with steady gait.  He has not fallen in the last 6 months.  Fall prevention interventions: Patient provided with non-skid stockings  He denies taking any blood thinners.  He denies having a bleeding disorder.  He  Has held his medications as directed on yellow form.  He denies a history of fainting during medical procedures.   He has not been NPO.  He undressed self without assistance.   I verbally reviewed written discharge instructions and pain diary with him.   Confirmed ride home with his wife, Buel ReamLindy.

## 2018-03-23 NOTE — Progress Notes (Signed)
Pain score prior to procedure:  7/10  Pain score after procedure:  0/10    [name] met discharge criteria:  A/OX4/baseline, VSS/returned to baseline, ambulatory with steady gait. Site clean dry and intact.  Written and verbal discharge instructions, including emergency contact phone numbers, reviewed with [name]. Verbal understanding obtained from him.  [name] dressed self without assistance. He was discharged in stable condition, ambulatory with steady gait to home with all belongings and with ride/responsible person.

## 2018-04-07 ENCOUNTER — Encounter (INDEPENDENT_AMBULATORY_CARE_PROVIDER_SITE_OTHER): Payer: Self-pay | Admitting: Internal Medicine

## 2018-04-22 ENCOUNTER — Telehealth (INDEPENDENT_AMBULATORY_CARE_PROVIDER_SITE_OTHER): Payer: Self-pay | Admitting: Internal Medicine

## 2018-04-22 DIAGNOSIS — I619 Nontraumatic intracerebral hemorrhage, unspecified: Secondary | ICD-10-CM

## 2018-04-22 DIAGNOSIS — G609 Hereditary and idiopathic neuropathy, unspecified: Secondary | ICD-10-CM

## 2018-04-22 DIAGNOSIS — Z91030 Bee allergy status: Secondary | ICD-10-CM

## 2018-04-22 MED ORDER — EPINEPHRINE 0.3 MG/0.3ML IJ SOAJ
INTRAMUSCULAR | 1 refills | Status: DC
Start: 2018-04-22 — End: 2020-01-15

## 2018-04-22 MED ORDER — AMLODIPINE BESYLATE 5 MG OR TABS
5.0000 mg | ORAL_TABLET | Freq: Two times a day (BID) | ORAL | 1 refills | Status: DC
Start: 2018-04-22 — End: 2018-07-25

## 2018-04-27 ENCOUNTER — Ambulatory Visit (INDEPENDENT_AMBULATORY_CARE_PROVIDER_SITE_OTHER): Payer: PPO | Admitting: Podiatrist

## 2018-04-27 VITALS — BP 129/91 | HR 84

## 2018-04-27 DIAGNOSIS — M10071 Idiopathic gout, right ankle and foot: Secondary | ICD-10-CM

## 2018-04-27 MED ORDER — METHYLPREDNISOLONE ACETATE 40 MG/ML IJ SUSP
20.0000 mg | Freq: Once | INTRAMUSCULAR | Status: AC
Start: 2018-04-27 — End: 2018-04-27
  Administered 2018-04-27: 20 mg via INTRA_ARTICULAR

## 2018-04-27 NOTE — Patient Instructions (Addendum)
I recommend that he see his PCP regards to allopurinal.

## 2018-04-27 NOTE — Progress Notes (Signed)
FOLLOW UP SOAP NOTE    Patient: Ronnie Mason   Patient DOB: 02-08-1951     DOS:  04/27/2018     Accompanied by:  patient was unaccompanied    Subjective:  Ronnie Mason  is a 67 year old year old male who presents today for re-evaluation.  Patient's current C/C, problem/diagnosis is right foot and ankle.  Evaluate forefoot, bottom , very painful now, question gout.  The patient states that the current condition unchanged.  The current condition has existed for 1, year(s) recent forefoot issues now and onset was gradually.  He states of more pain on weight bearing along the sesamoid region. Patient describes the symptoms as sharp and painful all the time.  Patient denies any other issues.  The affected area is made worse by unknown, just hurts all the time.  Past treatments/studies include gout medicine, PT, Injection, other options.  The patient states no improvement.  Patient's limitation is limited in walking.      Goals: evaluate current condition    PCP:  [name], MD     PMH:   Past Medical History:   Diagnosis Date    Carotid Sinus Hypersensitivity     Disc disorder of lumbosacral region     Hip injury     HYPERTENSION      HYPOTENSION      Intraparenchymal hemorrhage of brain (HCC) 2017    Pain of right hip joint 01/23/2016    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 myelopathy or radiculopathy 05/28/2014    Syncope     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(HCC)     URI (upper respiratory infection)         Review of patient's allergies indicates:  Allergies   Allergen Reactions    Adhesives Rash    Bee Venom Hives, Itching and Swelling    Gabapentin      Flu like symptoms, diarrhea, body ache upset stomach        Medications:   Outpatient Medications Prior to Visit   Medication Sig Dispense Refill    Acetaminophen 500 MG Oral Tab 2 tablets bid      amLODIPine 5 MG Oral Tablet Take 1 tablet (5 mg) by mouth 2 times a day. 90 tablet 1    Atorvastatin Calcium 10 MG Oral Tab Take 1 tablet (10  mg) by mouth daily. 90 tablet 2    Cholecalciferol (VITAMIN D3) 2000 units Oral Cap Take 1 capsule (2,000 Units) by mouth daily. For low level 1 capsule 1    colchicine 0.6 MG Oral Tab Take 2 tabs po now and one tab hour later on the first day, then take 1 tab po qd till better 30 tablet 0    Cyanocobalamin 1000 MCG Oral Tab one per day over-the-counter started 5/21 /2012 this level borderline low (Patient not taking: No sig reported) 1 Tab 1    diclofenac (VOLTAREN) 1 % Transdermal Gel Apply 2 g topically 4 times a day. Apply to neck, trapezius muscle, and low back. Toe  No more than 32 g applied per day total 3 Tube 4    diclofenac 75 MG Oral Tab EC Take 1 tablet (75 mg) by mouth 2 times a day. 60 tablet 3    DULoxetine HCl 30 MG Oral CAPSULE ENTERIC COATED PARTICLES Take 1 capsule (30 mg) by mouth daily. 90 capsule 1    DULoxetine HCl 60 MG Oral CAPSULE ENTERIC COATED PARTICLES Take 1 capsule (60 mg) by mouth  daily. 90 capsule 1    EPINEPHrine 0.3 MG/0.3ML Injection Solution Auto-injector Inject as instructed per patient package insert, 0.3 mg intramuscularly or subcutaneously into the thigh, if needed to treat anaphylaxis 3 each 1    Labetalol HCl 100 MG Oral Tab Take 100 mg by mouth.      Lidocaine 5 % External Patch Apply 1 patch onto the skin daily. Apply to painful area for up to 12 hours in a 24 hour period. 30 patch 10    Lisinopril 20 MG Oral Tab Take 1 tablet (20 mg) by mouth every 12 hours. 180 tablet 3    predniSONE 20 MG Oral Tab Take 20 mg by mouth.      pregabalin 100 MG Oral Cap Take 1 capsule (100 mg) by mouth 2 times a day. 180 capsule 1    pregabalin 50 MG Oral Cap 1 po bid 180 capsule 1    Primidone 50 MG Oral Tab Take 1 tablet (50 mg) by mouth 2 times a day. 180 tablet 1    THIAMINE HCL OR       triamcinolone 0.1 % External Cream As needed to right ear bid x 1 week 1 Tube 0     No facility-administered medications prior to visit.         PSH:   Past Surgical History:    Procedure Laterality Date    ANES; COLONOSCOPY  2002    repeat in 3 years    ANES; COLONOSCOPY & POLYPECTOMY  11/18/2007    repeat in 3 years    HALLUX RIGIDUS W/CHEILECTOMY 1ST MP JT W/O IMPLT Right 10/21/2017    Dr. Donn Pierini    L meniciscus Left 07/2017    by Dr Marcelle Overlie , re did left menisicus     UNLISTED PROCEDURE FEMUR/KNEE      UNLISTED PROCEDURE HANDS/FINGERS      UNLISTED PROCEDURE SPINE  2013    rfa to c3 to c 7         Family History:  family history includes Colon Cancer in his father; Heart (other) in his mother; Other Family Hx in an other family member.    Social History:  Social History     Socioeconomic History    Marital status: Married     Spouse name: Not on file    Number of children: Not on file    Years of education: Not on file    Highest education level: Not on file   Occupational History    Not on file   Social Needs    Financial resource strain: Not on file    Food insecurity:     Worry: Not on file     Inability: Not on file    Transportation needs:     Medical: Not on file     Non-medical: Not on file   Tobacco Use    Smoking status: Never Smoker    Smokeless tobacco: Former Neurosurgeon   Substance and Sexual Activity    Alcohol use: Yes    Drug use: No    Sexual activity: Not on file   Lifestyle    Physical activity:     Days per week: Not on file     Minutes per session: Not on file    Stress: Not on file   Relationships    Social connections:     Talks on phone: Not on file     Gets together: Not on file  Attends religious service: Not on file     Active member of club or organization: Not on file     Attends meetings of clubs or organizations: Not on file     Relationship status: Not on file    Intimate partner violence:     Fear of current or ex partner: Not on file     Emotionally abused: Not on file     Physically abused: Not on file     Forced sexual activity: Not on file   Other Topics Concern    Not on file   Social History Narrative    ** Merged  History Encounter **             Physical Exam:   Vitals:    04/27/18 0948   BP: (!) 129/91   BP Cuff Size: Regular   BP Site: Right Arm   BP Position: Sitting   Pulse: 84        General: The patient is White, Well developed, appearing stated age and in no acute distress      Vascular:   RIGHT:  Dorsalis pedis pulse is palpable.  Posterior tibial pulse is palpable. Capillary filling time note to be immediate  on the distal hallux.      Neurologic:  RIGHT:  The sensation is intact on the foot/ankle.  Motor coordination is intact with normal muscle tone.  There is no manifestations of pathological reflexes noted.      Dermatological:   RIGHT:  Upon inspection, the skin note to have normal texture/tone/turgor.  There is no ulceration/open wound or dermatosis present.  Integument coloration is within normal range and temperature is within normal range.  The hair growth is normal.     Musculoskeletal:   RIGHT:  There is moderated pain with palpation to dorsal and plantar 1st metatarsophalangeal joint and over the lateral malleolus.  There is redness, swellingon the 1st MPJ and lateral ankle joint.  There is stiffness on 1st MTPJ particularly plantarflexion.      Assessment:   (M10.071) Acute idiopathic gout involving toe of right foot  (primary encounter diagnosis)  Plan: INJECTION 1 TENDON SHEATH/LIGAMENT APONEUROSIS,        methylPREDNISolone acetate (Depo-Medrol) 40         mg/mL inj susp    Plan:  Detailed education and discussion took place with the patient regards to Gout and not responding to current care.  We explained how condition possibly occurred.  We discuss that this condition can return, even with current treatment.  We did explain to the patient that gout is a system condition and patient may want to follow up with primary physician for evaluation.  Current options are continue current care, diagnostic lab test, low purine diet, Colcrys, steroid injection and see PCP regards to allopurinal.  The patient  elected to try steroid injection and see PCP regards to allopurinal.  We have reviewed previous uric acid level 7.7.   I will order another lab (uric acid level, CBC diff, CRP) before he see his PCP.    A corticosteroid injection was administered surrounding the sub 1st metatarsal region over the sesamoids.  I have prepped the injection site with three betadine prep.  Sprayed area with Ethyl Chloride.  A mixture of 2% Lidocaine with DepoMedrol 20mg  was administered   The purpose of the injection is to reduce symptoms.   Patient has complete relief following injection.    I recommend adding felt arch pad to  current insert.  Other option is orthotic if there is biomechanical component.    Return appointment: Return in about 3 weeks (around 05/18/2018), or if symptoms worsen or fail to improve.    TIME SPENT:  25 minutes was spent face-to-face with patient, greater than 50% of which was spent counseling and coordinating care, and it is separate from procedure. They were given an opportunity to ask questions which were answered in a detailed manner.

## 2018-04-27 NOTE — Progress Notes (Signed)
Ronnie Mason  is a 67 year old year old male who presents today for re-evaluation.  Patient's current C/C, problem/diagnosis is right foot and ankle.  Evaluate forefoot, bottom , very painful now, question gout  The patient states that the current condition unchanged.  The current condition has existed for 1, year(s) recent forefoot issues now   and onset was gradually.    Patient describes the symptoms as sharp and painful all the time.  Patient denies any other issues.  The affected area is made worse by unknown, just hurts all the time.  Past treatments/studies include gout medicine, PT, Injection, other options.  The patient states no improvement.  Patient's limitation is limited in walking.

## 2018-04-28 ENCOUNTER — Telehealth (INDEPENDENT_AMBULATORY_CARE_PROVIDER_SITE_OTHER): Payer: Self-pay | Admitting: Internal Medicine

## 2018-04-28 ENCOUNTER — Ambulatory Visit: Payer: PPO | Attending: Podiatrist

## 2018-04-28 ENCOUNTER — Other Ambulatory Visit (INDEPENDENT_AMBULATORY_CARE_PROVIDER_SITE_OTHER): Payer: Self-pay | Admitting: Podiatrist

## 2018-04-28 DIAGNOSIS — M10071 Idiopathic gout, right ankle and foot: Secondary | ICD-10-CM

## 2018-04-28 LAB — CBC, DIFF
% Basophils: 1 %
% Eosinophils: 1 %
% Immature Granulocytes: 0 %
% Lymphocytes: 19 %
% Monocytes: 13 %
% Neutrophils: 66 %
% Nucleated RBC: 0 %
Absolute Eosinophil Count: 0.08 10*3/uL (ref 0.00–0.50)
Absolute Lymphocyte Count: 1.47 10*3/uL (ref 1.00–4.80)
Basophils: 0.06 10*3/uL (ref 0.00–0.20)
Hematocrit: 48 % (ref 38–50)
Hemoglobin: 15.4 g/dL (ref 13.0–18.0)
Immature Granulocytes: 0.03 10*3/uL (ref 0.00–0.05)
MCH: 33.5 pg (ref 27.3–33.6)
MCHC: 32 g/dL — ABNORMAL LOW (ref 32.2–36.5)
MCV: 105 fL — ABNORMAL HIGH (ref 81–98)
Monocytes: 1.01 10*3/uL — ABNORMAL HIGH (ref 0.00–0.80)
Neutrophils: 4.95 10*3/uL (ref 1.80–7.00)
Nucleated RBC: 0 10*3/uL
Platelet Count: 226 10*3/uL (ref 150–400)
RBC: 4.6 10*6/uL (ref 4.40–5.60)
RDW-CV: 13.6 % (ref 11.6–14.4)
WBC: 7.6 10*3/uL (ref 4.3–10.0)

## 2018-04-28 LAB — URIC ACID, SERUM: Uric Acid: 5.5 mg/dL (ref 3.9–7.6)

## 2018-04-28 LAB — C_REACTIVE PROTEIN: C_Reactive Protein: 2 mg/L (ref 0.0–10.0)

## 2018-04-28 NOTE — Telephone Encounter (Signed)
Pt called in because he saw [name] yesterday and is testing Uric acid level but wanted to know what you thpught about pt taking medication Allopurinol?

## 2018-04-29 NOTE — Telephone Encounter (Signed)
Uric acid is down   Not sure  To see Dr [name] in follow up     Please let him know

## 2018-04-29 NOTE — Telephone Encounter (Signed)
Left message for Ronnie Mason regarding decrease in his uric acid level and Dr. Joette Catching recommendation to  Follow up with with [name]

## 2018-05-02 ENCOUNTER — Telehealth (INDEPENDENT_AMBULATORY_CARE_PROVIDER_SITE_OTHER): Payer: Self-pay | Admitting: Internal Medicine

## 2018-05-02 DIAGNOSIS — H6061 Unspecified chronic otitis externa, right ear: Secondary | ICD-10-CM

## 2018-05-02 MED ORDER — TRIAMCINOLONE ACETONIDE 0.1 % EX CREA
TOPICAL_CREAM | CUTANEOUS | 0 refills | Status: DC
Start: 2018-05-02 — End: 2018-11-14

## 2018-05-02 NOTE — Telephone Encounter (Signed)
Refill signed by VK.

## 2018-05-18 ENCOUNTER — Encounter (INDEPENDENT_AMBULATORY_CARE_PROVIDER_SITE_OTHER): Payer: Self-pay

## 2018-05-18 ENCOUNTER — Ambulatory Visit (INDEPENDENT_AMBULATORY_CARE_PROVIDER_SITE_OTHER): Payer: PPO | Admitting: Podiatrist

## 2018-05-18 DIAGNOSIS — M2021 Hallux rigidus, right foot: Secondary | ICD-10-CM

## 2018-05-18 DIAGNOSIS — M2031 Hallux varus (acquired), right foot: Secondary | ICD-10-CM

## 2018-05-18 DIAGNOSIS — M10071 Idiopathic gout, right ankle and foot: Secondary | ICD-10-CM

## 2018-05-18 NOTE — Progress Notes (Signed)
Ronnie Mason  is a 67 year old year old male who presents today for re-evaluation.    Patient's C/C - current problem is Right foot follow up-not getting any better.    The patient states that the current condition not getting better.    The current condition has existed for year(s)  and onset was gradually.    Patient describes the symptoms as pain.    Patient denies any other issues.    The affected area is made worse by uneven ground.    Past treatments/studies include ankle brace.    The patient states not getting better  Patient's limitation is limited in walking.

## 2018-05-18 NOTE — Progress Notes (Signed)
Ronnie Mason is here today to see [name].  Please see [name] notes on today's visit.  While he was here I fit him with a size 44 quick stride.  I modified these by heeding and increasing the lateral support of both left and right inserts by about 4.  I then added a 1/8 inch Spenco top cover to both left and right inserts.  I then added a forefoot piece of 1/8 inch EVA to the front's left orthotic just to ensure that everything stays together.  On the right side I modified this with an additional 3/16 inch thick Korex piece to the sulcus.  I added a first metatarsal accommodation to this with a valgus wedging of the Korex itself.  I then backfilled this.  He will try this over the next 4-6 weeks.  He may require a second and third set of the quick strides if he likes these.  He is been utilizing a different type of an over-the-counter insole with an additional felt arch added to it.  He will let us know and we'll follow back as is needed.

## 2018-05-18 NOTE — Patient Instructions (Signed)
-   Wear Quickstride insert all time

## 2018-05-18 NOTE — Progress Notes (Signed)
FOLLOW UP SOAP NOTE    Patient: Ronnie Mason   Patient DOB: 1951/04/18     DOS:  05/18/2018     Accompanied by:  patient was unaccompanied    Subjective:  id Ronnie Mason  is a 67 year old year old male who presents today for re-evaluation.    Patient's C/C - current problem is Right foot follow up-not getting any better.  The patient states that the current condition not getting better.  The current condition has existed for year(s)  and onset was gradually.  Patient describes the symptoms as pain - 4/10.  Patient denies any other issues.  The affected area is made worse by uneven ground.    Past treatments/studies include ankle brace.  The patient states not getting better  Patient's limitation is limited in walking.     PCP:  Ronnie Medal, MD     PMH:   Past Medical History:   Diagnosis Date    Carotid Sinus Hypersensitivity     Disc disorder of lumbosacral region     Hip injury     HYPERTENSION      HYPOTENSION      Intraparenchymal hemorrhage of brain (HCC) 2017    Pain of right hip joint 01/23/2016    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 myelopathy or radiculopathy 05/28/2014    Syncope     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(HCC)     URI (upper respiratory infection)         Review of patient's allergies indicates:  Allergies   Allergen Reactions    Adhesives Rash    Bee Venom Hives, Itching and Swelling    Gabapentin      Flu like symptoms, diarrhea, body ache upset stomach        Medications:   Outpatient Medications Prior to Visit   Medication Sig Dispense Refill    Acetaminophen 500 MG Oral Tab 2 tablets bid      amLODIPine 5 MG Oral Tablet Take 1 tablet (5 mg) by mouth 2 times a day. 90 tablet 1    Atorvastatin Calcium 10 MG Oral Tab Take 1 tablet (10 mg) by mouth daily. 90 tablet 2    Cholecalciferol (VITAMIN D3) 2000 units Oral Cap Take 1 capsule (2,000 Units) by mouth daily. For low level 1 capsule 1    colchicine 0.6 MG Oral Tab Take 2 tabs po now and one tab hour later on the  first day, then take 1 tab po qd till better 30 tablet 0    Cyanocobalamin 1000 MCG Oral Tab one per day over-the-counter started 5/21 /2012 this level borderline low (Patient not taking: No sig reported) 1 Tab 1    diclofenac (VOLTAREN) 1 % Transdermal Gel Apply 2 g topically 4 times a day. Apply to neck, trapezius muscle, and low back. Toe  No more than 32 g applied per day total 3 Tube 4    diclofenac 75 MG Oral Tab EC Take 1 tablet (75 mg) by mouth 2 times a day. 60 tablet 3    DULoxetine HCl 30 MG Oral CAPSULE ENTERIC COATED PARTICLES Take 1 capsule (30 mg) by mouth daily. 90 capsule 1    DULoxetine HCl 60 MG Oral CAPSULE ENTERIC COATED PARTICLES Take 1 capsule (60 mg) by mouth daily. 90 capsule 1    EPINEPHrine 0.3 MG/0.3ML Injection Solution Auto-injector Inject as instructed per patient package insert, 0.3 mg intramuscularly or subcutaneously into the thigh, if needed to treat anaphylaxis 3  each 1    Labetalol HCl 100 MG Oral Tab Take 100 mg by mouth.      Lidocaine 5 % External Patch Apply 1 patch onto the skin daily. Apply to painful area for up to 12 hours in a 24 hour period. 30 patch 10    Lisinopril 20 MG Oral Tab Take 1 tablet (20 mg) by mouth every 12 hours. 180 tablet 3    predniSONE 20 MG Oral Tab Take 20 mg by mouth.      pregabalin 100 MG Oral Cap Take 1 capsule (100 mg) by mouth 2 times a day. 180 capsule 1    pregabalin 50 MG Oral Cap 1 po bid 180 capsule 1    Primidone 50 MG Oral Tab Take 1 tablet (50 mg) by mouth 2 times a day. 180 tablet 1    THIAMINE HCL OR       triamcinolone 0.1 % External Cream As needed to right ear bid x 1 week 1 Tube 0     No facility-administered medications prior to visit.         PSH:   Past Surgical History:   Procedure Laterality Date    ANES; COLONOSCOPY  2002    repeat in 3 years    ANES; COLONOSCOPY & POLYPECTOMY  11/18/2007    repeat in 3 years    HALLUX RIGIDUS W/CHEILECTOMY 1ST MP JT W/O IMPLT Right 10/21/2017    Dr. Donn Pierini    L  meniciscus Left 07/2017    by Dr Marcelle Overlie , re did left menisicus     UNLISTED PROCEDURE FEMUR/KNEE      UNLISTED PROCEDURE HANDS/FINGERS      UNLISTED PROCEDURE SPINE  2013    rfa to c3 to c 7         Family History:  family history includes Colon Cancer in his father; Heart (other) in his mother; Other Family Hx in an other family member.    Social History:  Social History     Socioeconomic History    Marital status: Married     Spouse name: Not on file    Number of children: Not on file    Years of education: Not on file    Highest education level: Not on file   Occupational History    Not on file   Social Needs    Financial resource strain: Not on file    Food insecurity:     Worry: Not on file     Inability: Not on file    Transportation needs:     Medical: Not on file     Non-medical: Not on file   Tobacco Use    Smoking status: Never Smoker    Smokeless tobacco: Former Neurosurgeon   Substance and Sexual Activity    Alcohol use: Yes    Drug use: No    Sexual activity: Not on file   Lifestyle    Physical activity:     Days per week: Not on file     Minutes per session: Not on file    Stress: Not on file   Relationships    Social connections:     Talks on phone: Not on file     Gets together: Not on file     Attends religious service: Not on file     Active member of club or organization: Not on file     Attends meetings of clubs or organizations: Not on file  Relationship status: Not on file    Intimate partner violence:     Fear of current or ex partner: Not on file     Emotionally abused: Not on file     Physically abused: Not on file     Forced sexual activity: Not on file   Other Topics Concern    Not on file   Social History Narrative    ** Merged History Encounter **             Physical Exam:   There were no vitals filed for this visit.     General: The patient is White, Well developed, appearing stated age and in no acute distress      Vascular:   RIGHT:  Dorsalis pedis pulse is  palpable.  Posterior tibial pulse is palpable. Capillary filling time note to be immediate  on the distal hallux.      Neurologic:  RIGHT:  The sensation is intact on the foot/ankle.  Motor coordination is intact with normal muscle tone.  There is no manifestations of pathological reflexes noted.      Dermatological:   RIGHT:  Upon inspection, the skin note to have normal texture/tone/turgor.  There is no ulceration/open wound or dermatosis present.  Integument coloration is within normal range and temperature is within normal range.  The hair growth is normal.     Musculoskeletal:   RIGHT:  There is pain with palpation on 1st MTPJ and sesmoid .  There is  severe limitation of ROM on 1st MTPJ with hallux mallet toe deformity.  The 1st ray is plantarflexion.      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 The patient was given an opportunity to ask questions.  We have reviewed previous X-ray finding and detail discussion took place with the patient.  We have recommend following options: continue current treatment regimen, orthotic, surgical treatment and Quickstride insert-with forefoot valgus modification.  He inquire about another injection.  After discussing the treatment options, patient elected to try following treatment:  Quickstride insert-with forefoot valgus modification.      Quickstride insert with modification order:  The patient was educated about the purpose of the device.  All questions were answered and no guarantee was given.  Patient will see pedorthist for evaluation and dispense and modification of Quickstride insert.      Return appointment: Return in about 3  weeks (around 06/08/2018), or if symptoms worsen or fail to improve.    TIME SPENT:  15 minutes was spent face-to-face with patient, greater than 50% of which was spent counseling and coordinating care. They were given an opportunity to ask questions which were answered in a detailed manner.    [name] DPM, FACFAS  Reconstructive Ankle and Foot Surgeon  Ankle Joint Replacement Surgeon  Fellowship Trained

## 2018-05-23 ENCOUNTER — Telehealth (INDEPENDENT_AMBULATORY_CARE_PROVIDER_SITE_OTHER): Payer: Self-pay | Admitting: Internal Medicine

## 2018-05-23 DIAGNOSIS — M542 Cervicalgia: Secondary | ICD-10-CM

## 2018-05-23 DIAGNOSIS — G894 Chronic pain syndrome: Secondary | ICD-10-CM

## 2018-05-23 DIAGNOSIS — E785 Hyperlipidemia, unspecified: Secondary | ICD-10-CM

## 2018-05-23 MED ORDER — ATORVASTATIN CALCIUM 10 MG OR TABS
10.0000 mg | ORAL_TABLET | Freq: Every day | ORAL | 2 refills | Status: DC
Start: 2018-05-23 — End: 2020-07-05

## 2018-05-23 MED ORDER — DULOXETINE HCL 60 MG OR CPEP
60.0000 mg | DELAYED_RELEASE_CAPSULE | Freq: Every day | ORAL | 1 refills | Status: DC
Start: 2018-05-23 — End: 2018-06-13

## 2018-05-26 ENCOUNTER — Encounter (INDEPENDENT_AMBULATORY_CARE_PROVIDER_SITE_OTHER): Payer: Self-pay | Admitting: Internal Medicine

## 2018-05-26 NOTE — Telephone Encounter (Signed)
Opened in error

## 2018-06-13 ENCOUNTER — Telehealth (INDEPENDENT_AMBULATORY_CARE_PROVIDER_SITE_OTHER): Payer: Self-pay | Admitting: Internal Medicine

## 2018-06-13 ENCOUNTER — Telehealth (HOSPITAL_BASED_OUTPATIENT_CLINIC_OR_DEPARTMENT_OTHER): Payer: Self-pay | Admitting: Family Medicine

## 2018-06-13 DIAGNOSIS — G894 Chronic pain syndrome: Secondary | ICD-10-CM

## 2018-06-13 DIAGNOSIS — M542 Cervicalgia: Secondary | ICD-10-CM

## 2018-06-13 MED ORDER — DULOXETINE HCL 60 MG OR CPEP
60.0000 mg | DELAYED_RELEASE_CAPSULE | Freq: Every day | ORAL | 1 refills | Status: DC
Start: 2018-06-13 — End: 2018-07-25

## 2018-06-13 MED ORDER — DULOXETINE HCL 30 MG OR CPEP
30.0000 mg | DELAYED_RELEASE_CAPSULE | Freq: Every day | ORAL | 1 refills | Status: DC
Start: 2018-06-13 — End: 2018-07-25

## 2018-06-13 NOTE — Telephone Encounter (Signed)
There was no pharmacy   So I put in [facility]  Hope this is the right one

## 2018-06-13 NOTE — Telephone Encounter (Signed)
RETURN CALL: Voicemail - Detailed Message      SUBJECT:  Appointment Request     REASON FOR VISIT: follow up low back pain injection consult  PREFERRED DATE/TIME: Afternoon 11/13  ADDITIONAL INFORMATION: Providers schedule template does not match SM for med/short slot type in Epic

## 2018-06-14 NOTE — Telephone Encounter (Signed)
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  He had an acute injury.  Has no radicular symptoms.  Continues to have a normal neurologic examination.  He has not yet gotten into any physical therapy but is connected to a physical therapist.  He recently took a 5 day oral prednisone course mainly for his right foot swelling and noted no improvement in his low back with this oral medication.  He came in asking today about the possibility of a repeat epidural steroid injection.  We discussed the pros and cons of this and discuss of indications typically more for radicular pain which she does not have.  However this is a reasonable option to consider given that he has had injections the past and done well and additionally he might be limited in regards to medications that he can take such as oral NSAIDs, since he apparently has been told by his primary care physician in the past but to take these.  The main reason he may not build to take these is that he has a notation of chronic kidney disease in his history.    - Prescribed diclofenac oral 75 mg BID for 2 weeks.  Prior to starting this I have asked him to speak with his primary care physician to see if this is okay.  He has an irritation of chronic kidney disease in his medical history which she is not aware about.  His last known creatinine was 1.10.  If he is not allowed to take this and he will continue with acetaminophen  - He will set up appointments with his physical therapist is seeking get into work on his low back  - Also plan for a left L5 transforaminal epidural steroid injection with dexamethasone.  Certainly if he takes the diclofenac and his symptoms improve and  there is no need to proceed with the injection    03/23/2018 Dr Gustavus BryantLiem performed: PROCEDURE:  Fluoroscopic guided  left L5 transforaminal epidural steroid injection    Future appt: None   _______________________    #1. Called the patient:      A. Helped quite a bit right side the left side low back is still acting up for me      B. [place]      C. I Only want to see Dr Gustavus BryantLiem     #2. Advised him    A. We can see you next week on Tuesday at 330pm with Dr Gustavus BryantLiem      B. He was agreeable with this    Closing the TE.    Ronnie NeighborShazad E Laddie Naeem RN  [facility]

## 2018-06-14 NOTE — Telephone Encounter (Signed)
Hi-    This message was accidentally sent to us.    Thanks,  Amy C, PSS-S

## 2018-06-15 ENCOUNTER — Encounter (INDEPENDENT_AMBULATORY_CARE_PROVIDER_SITE_OTHER): Payer: PPO | Admitting: Podiatrist

## 2018-06-15 ENCOUNTER — Ambulatory Visit (INDEPENDENT_AMBULATORY_CARE_PROVIDER_SITE_OTHER): Payer: PPO | Admitting: Unknown Physician Specialty

## 2018-06-15 DIAGNOSIS — Z23 Encounter for immunization: Secondary | ICD-10-CM

## 2018-06-15 NOTE — Progress Notes (Signed)
INFLUENZA VACCINE SCREENING  Order date: 06/15/2018  Interpreter used?: None  Vaccine information sheet(s) discussed, patient/parent/guardian verbalized understanding? YES   Vaccines given:   Orders Placed This Encounter    influenza high dose vaccine trivalent PF (adult greater than 3415yrs) 0.5 mL IM - [id number]     VIS given 06/15/2018 by Vicie Mutterslaudia Rhea Jenesis Martin, CMA .      SCREENING QUESTIONS: INACTIVATED INFLUENZA VACCINE:  1. Do you have a high fever, severe cold-like symptoms or serious infection? NO   2. Do you have any food allergies such as eggs, chicken, chicken feathers, chicken dander, or gelatin? NO  3. Have you had a severe reaction to a vaccine in the past? NO  4. Have you had any severe reaction to a vaccine in the past such as a seizure, a convulsion from a high fever, or a paralysis problem called Guillain-Barre Syndrome? NO  5. QUESTIONS FOR WOMEN: Are you pregnant or planning on becoming pregnant in the next month? (If YES, you may give Fluarix or Fluzone Pre-Filled syringe rather than Multi-Dose Fluzone). N/A    If the patient/parent/or guardian answered "yes" to any of the above questions, consult with the provider to review the responses prior to administering vaccination. Parents with a contraindication to immunization will not receive the immunization without a specific physician order to vaccinate the patient and discussion of risk and benefits related to the contraindication.

## 2018-06-16 ENCOUNTER — Ambulatory Visit (INDEPENDENT_AMBULATORY_CARE_PROVIDER_SITE_OTHER): Payer: PPO

## 2018-06-21 ENCOUNTER — Encounter (HOSPITAL_BASED_OUTPATIENT_CLINIC_OR_DEPARTMENT_OTHER): Payer: Self-pay | Admitting: Physical Medicine & Rehabilitation

## 2018-06-21 ENCOUNTER — Ambulatory Visit: Payer: PPO | Attending: Physical Medicine & Rehabilitation | Admitting: Physical Medicine & Rehabilitation

## 2018-06-21 VITALS — BP 145/96 | HR 99 | Ht 74.33 in | Wt 228.0 lb

## 2018-06-21 DIAGNOSIS — Z6829 Body mass index (BMI) 29.0-29.9, adult: Secondary | ICD-10-CM

## 2018-06-21 DIAGNOSIS — G8929 Other chronic pain: Secondary | ICD-10-CM

## 2018-06-21 DIAGNOSIS — M545 Low back pain, unspecified: Secondary | ICD-10-CM

## 2018-06-21 MED ORDER — PREDNISONE 50 MG OR TABS
50.0000 mg | ORAL_TABLET | Freq: Every day | ORAL | 0 refills | Status: DC
Start: 2018-06-21 — End: 2018-07-22

## 2018-06-21 NOTE — Patient Instructions (Signed)
Take prednisone for 5 days for acute flare    Referral to Pain Clinic to see Dr. [name].     Ronnie LiterBrian Eleena Grater, MD, CAQ Sports Medicine, [facility]  Attending Physician  Department of Rehabilitation Medicine  Division of Sports, Spine and Orthopedic Health  [facility]  [phone], Option #2

## 2018-06-21 NOTE — Progress Notes (Addendum)
CC: Follow up LEFT low back.     HPI:  Ronnie Mason is a 67 year old man with chronic low back pain I last saw on 03-23-18 for a left L5 TF ESI.  Recall, he had been describing acute left-sided low back pain at our visit on 03-14-18 after a twisting injury in June 2019 which I felt was an exacerbation of degenerative disc disease at L4 to L5 and L5 to S1.  I had asked him to follow up 2-3 weeks after the injection to assess the efficacy but have not seen him since that time, essentially 3 months later.    He reports that he had about 3-4 weeks of 80-85% relief of typical left-sided low back pain.  Then within the past few weeks he has had a gradually worsening of symptoms in the left low back.  Denies any back pain on the right side and denies any pain rating down his left leg.  Denies any specific exacerbating event over the past few weeks but he has been active.  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  His pain is rated 5 out of 10, aching and stabbing and typically worse with turning and twisting.  Better with rest.  Denies any new bowel or bladder incontinence, numbness or tingling.    He has been dealing with a first MTP joint pain.  He notes that he had been walking primarily in the lateral aspect of his foot but recently with new orthotics has been able to walk more normally.    Has been advised by his PCP to stick with acetaminophen and to avoid NSAIDs mainly because of his hypertension.  He is also taking Lyrica and duloxetine for neuropathy but also for chronic back pain.    He has also been having some recurrent left sided neck pain.  He recalls having some RFA to this in the past    Past medical, past surgical, social, family hx: Reviewed with patient and is: Seen by [facility] for RIGHT foot hallux rigidus    Physical Exam  Vital signs: There were no vitals taken for this visit.  HEENT: Normocephalic  Pulm: Non-labored respirations  Cardiovasc: No clubbing, cyanosis,  edema  Skin: No lesions in areas examined  Neuro: Strength is 5/5 lower extremities  MSK: Typical left-sided low back pain reproduced with lumbar extension.  Full lumbar flexion without pain.  Positive facet loading.  Negative seated slump and straight leg raise    Imaging/Diagnostic studies:  MRI from 08/10/16   - Significant degenerative disc disease most pronounced at L4 to L5 and L5 to S1    Procedures with me.   LEFT L5 TFESI  03-23-18 with 80-85% relief for about 3-4 weeks of just axial back pain.   Bilateral L5TFESI 09-15-17 with 50-75% relief of mixture of low back and leg pain.   RIGHT L5 TFESI 10-17-18with 100% relief of Radicular symptoms for about 3 months    Procedures performed by [facility] (Dr. Amada Jupiter)  Per her note 10-21-16  Injections in our clinic have included (exluding MBB and trigger point injections):  08/12/16 - L5-S1 ILESI  07/01/16 - Ultrasound guided right trochanteric bursa injection   05/20/16 - Bilateral shoulder injections  05/13/16 - Right L3 L4 L5/S1 RFA  02/19/16 - Left L3, L4, L5/S1 RFA  12/27/12 - L5-S1 IL ESI  08/31/12 - Left L5 TFESI  03/30/12 - Left L4/5, L5/S1 facet inj  02/10/12 - Left  C6 C7 MB RFA  07/03/11 - Left L3, L4, L5/S1 RFA  05/12/11 - Left C4, C5, C6 RFA    Impression:  67 year old man with chronic low back pain, recent acute flares on the left, likely a combination of facet mediated pain versus discogenic    Recurrent left-sided neck pain, likely facet mediated    Discussion/Plan:   It sounds as though Ronnie GristDave has essentially had a flare of his left-sided low back pain.  I feel that the pain at this point today is coming from his facet joints.  I spent some time reviewing the potential pain generators.  He does have significant degenerative disc disease.  We reviewed treatment options moving forward.      Certainly continuing with physical therapy is one option which she has been doing once a week already.  Other option is some interventional procedures.  He has now done 3  ESI's with me over the past 12 months, and his pain is really axial in nature.  I therefore do not feel that a repeat epidural steroid injection will provide him much long lasting relief.    To provide some temporary relief of acute pain, prescribed prednisone 50 mg daily x 5 days.     Extensive review of the chart shows that he had undergone radiofrequency ablation to the lumbar facets by Dr. [name] in 2017.  He does not recall this.  At this time I'm recommending that he return to Dr. [name] (last seen 10-21-16)  to see if repeating medial branch blocks and RFA I an option for his current left-sided axial back pain. I [facility].     It is noted that he has are on medications for chronic low back pain including Lyrica and duloxetine.  He notes that he had been on opioids for the past but was taken off of these medications by his PCP.  I do agree with not being on opioids for chronic back pain.  He cannot take NSAIDs because of hypertension as well as elevated renal markers.     Also Discussed with Ronnie Mason that he has seen a neurosurgeon in the past, Dr. Graciela HusbandsKlein on 02-15-17 and was felt that there was not a surgical option for his chronic low back pain    He can follow-up with me as needed.    Ronnie LiterBrian Jachob Mcclean, MD, CAQ Sports Medicine, [facility]  Clinical Assistant Professor  Attending Physician, Department of Rehabilitation Medicine  [facility]  Division of  Sports, Spine and  Orthopedic Health  [facility]

## 2018-06-22 ENCOUNTER — Ambulatory Visit (INDEPENDENT_AMBULATORY_CARE_PROVIDER_SITE_OTHER): Payer: PPO | Admitting: Podiatrist

## 2018-06-22 DIAGNOSIS — M2031 Hallux varus (acquired), right foot: Secondary | ICD-10-CM

## 2018-06-22 DIAGNOSIS — M2580 Other specified joint disorders, unspecified joint: Secondary | ICD-10-CM

## 2018-06-22 DIAGNOSIS — M2021 Hallux rigidus, right foot: Secondary | ICD-10-CM

## 2018-06-22 NOTE — Progress Notes (Signed)
Ronnie Mason is a 67 year old year old malewho presents today for re-evaluation.   Patient's C/C - current problem isRight foot follow up different feeling, feels like its in to ankle now, now the left foot is getting bad, into ankle. Lots of swelling in knee and back now,  The patient states that the current conditionnot getting better. The current condition has existed foryear(s)and onset was gradually. Patient describes the symptoms aspain - 7 /10. Patient deniesany other issues. The affected area is made worse byuneven ground.   Past treatments/studies includeankle brace. The patient statesnot getting better  Patient's limitation islimited in walking.  Wants it fixed,  Having issues with orthotics, shoes an issue

## 2018-06-22 NOTE — Progress Notes (Signed)
FOLLOW UP SOAP NOTE    Patient: Ronnie Mason   Patient DOB: [birth year]     DOS:  06/22/2018     Accompanied by:  spouse    Subjective:  Ronnie Mason is a 67 year old year old malewho presents today for re-evaluation.   Patient's C/C - current problem iscomplex problem:  significant hallux rigidus with mallet toe and plantarflexed 1st metatarsal with sesamoiditis and h/o gout. The main area of pain is sub 1st MTPJ with swelling. The patient states that the current condition getting better with using the modified Quickstride insert. The current condition's duration: year(s). Patient describes the symptoms aspain- 5-7 /10 at mid afternoon with swelling plantarly. Patient deniesfever and chills chest pain shortness breath. The affected area is made worse byuneven ground. Past treatments/studies includeQuickstride with modification. The patient statessome improvement with the insert.  Patient's limitation islimited in walking. Dr. [name] Rx prednisone and will start today.     Goals: walk without pain.    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 90 tablet  1    atorvastatin 10 MG Oral Tablet Take 1 tablet (10 mg) by mouth daily. 90 tablet 2    Cholecalciferol (VITAMIN D3) 2000 units Oral Cap Take 1 capsule (2,000 Units) by mouth daily. For low level 1 capsule 1    Cyanocobalamin 1000 MCG Oral Tab one per day over-the-counter started 5/21 /2012 this level borderline low 1 Tab 1    diclofenac (VOLTAREN) 1 % Transdermal Gel Apply 2 g topically 4 times a day. Apply to neck, trapezius muscle, and low back. Toe  No more than 32 g applied per day total 3 Tube 4    DULoxetine 30 MG DR capsule Take 1 capsule (30 mg) by mouth daily. 90 capsule 1    DULoxetine 60 MG DR capsule Take 1 capsule (60 mg) by mouth daily. 90 capsule 1    EPINEPHrine 0.3 MG/0.3ML Injection Solution Auto-injector Inject as instructed per patient package insert, 0.3 mg intramuscularly or subcutaneously into the thigh, if needed to treat anaphylaxis 3 each 1    Lidocaine 5 % External Patch Apply 1 patch onto the skin daily. Apply to painful area for up to 12 hours in a 24 hour period. 30 patch 10    Lisinopril 20 MG Oral Tab Take 1 tablet (  20 mg) by mouth every 12 hours. 180 tablet 3    predniSONE 50 MG tablet Take 1 tablet (50 mg) by mouth daily. 5 tablet 0    pregabalin 100 MG Oral Cap Take 1 capsule (100 mg) by mouth 2 times a day. 180 capsule 1    pregabalin 50 MG Oral Cap 1 po bid 180 capsule 1    Primidone 50 MG Oral Tab Take 1 tablet (50 mg) by mouth 2 times a day. 180 tablet 1    THIAMINE HCL OR       triamcinolone 0.1 % External Cream As needed to right ear bid x 1 week 1 Tube 0     No facility-administered medications prior to visit.         PSH:   Past Surgical History:   Procedure Laterality Date    ANES; COLONOSCOPY  2002    repeat in 3 years    ANES; COLONOSCOPY & POLYPECTOMY  11/18/2007    repeat in 3 years    HALLUX RIGIDUS W/CHEILECTOMY 1ST MP JT W/O IMPLT Right 10/21/2017    Dr. Donn PieriniBrian McInnes    L meniciscus Left 07/2017    by Dr Marcelle OverlieHolland , re did left menisicus      UNLISTED PROCEDURE FEMUR/KNEE      UNLISTED PROCEDURE HANDS/FINGERS      UNLISTED PROCEDURE SPINE  2013    rfa to c3 to c 7         Family History:  family history includes Colon Cancer in his father; Heart (other) in his mother; Other Family Hx in an other family member.    Social History:  Social History     Socioeconomic History    Marital status: Married     Spouse name: Not on file    Number of children: Not on file    Years of education: Not on file    Highest education level: Not on file   Occupational History    Not on file   Social Needs    Financial resource strain: Not on file    Food insecurity:     Worry: Not on file     Inability: Not on file    Transportation needs:     Medical: Not on file     Non-medical: Not on file   Tobacco Use    Smoking status: Never Smoker    Smokeless tobacco: Former NeurosurgeonUser   Substance and Sexual Activity    Alcohol use: Yes    Drug use: No    Sexual activity: Not on file   Lifestyle    Physical activity:     Days per week: Not on file     Minutes per session: Not on file    Stress: Not on file   Relationships    Social connections:     Talks on phone: Not on file     Gets together: Not on file     Attends religious service: Not on file     Active member of club or organization: Not on file     Attends meetings of clubs or organizations: Not on file     Relationship status: Not on file    Intimate partner violence:     Fear of current or ex partner: Not on file     Emotionally abused: Not on file     Physically abused: Not on file     Forced sexual activity: Not on file   Other  Topics Concern    Not on file   Social History Narrative    ** Merged History Encounter **             Physical Exam:   There were no vitals filed for this visit.     General: The patient is White, Well developed, appearing stated age and in no acute distress      Vascular:   RIGHT:  Dorsalis pedis pulse is palpable.  Posterior tibial pulse is palpable. Capillary filling time note to  be immediate  on the distal hallux.      Neurologic:  RIGHT:  The sensation is intact on the foot/ankle.  Motor coordination is intact with normal muscle tone.  There is no manifestations of pathological reflexes noted.      Dermatological:   RIGHT:  Upon inspection, the skin note to have normal texture/tone/turgor.  There is no ulceration/open wound or dermatosis present.  Integument coloration is within normal range and temperature is within normal range.  The hair growth is normal.     Musculoskeletal:   RIGHT:  There is severe pain on palpation of the fibular sesamoid. There is swelling noted over the affect region.  There is no warmth to touch and ecchymosis of the affected area.   here is  severe limitation of ROM on 1st MTPJ with hallux mallet toe deformity.  The 1st ray is plantarflexion.      DIAGNOSTIC IMAGING:    Right:  AP/LAT/PA foot views     Finding:   On lateral view there is joint compression of the base of the proximal phalanx with worsening of arthrosis of the first metatarsophalangeal joint compared to prior x-ray.  There is hallux mallet deformity.  The tibial sesamoid is probably but no evidence of osteolysis, AVN and fracture.    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 The patient was given an opportunity to ask questions.  We have reviewed x-ray finding and detail discussion took place with the patient - worsening of the first metatarsal phalangeal joint arthrosis.  We have recommend following options: continue current treatment regimen, corticosteroid injection, orthotic and surgical treatment - first metatarsal phalangeal joint arthrodesis.  After discussing the treatment options, patient elected to try following treatment:   continue current treatment regimen.  He will start prednisone therapy today and elected see how it will help him.    Return appointment: Return in about 4 weeks (around 07/20/2018), or if symptoms worsen or fail to improve, for follow-up.    TIME SPENT:  15 minutes was spent face-to-face with patient, greater than 50% of which was spent counseling and coordinating care. They were given an opportunity to ask questions which were answered in a detailed manner.    [name] DPM, FACFAS  Reconstructive Ankle and Foot Surgeon

## 2018-06-23 NOTE — Patient Instructions (Signed)
Continue to wear coach tried inserted with modification

## 2018-06-27 ENCOUNTER — Telehealth (HOSPITAL_BASED_OUTPATIENT_CLINIC_OR_DEPARTMENT_OTHER): Payer: Self-pay

## 2018-06-27 NOTE — Telephone Encounter (Signed)
Left message to call and schedule appt

## 2018-06-27 NOTE — Telephone Encounter (Signed)
Patient called, scheduled appt, verified reg and insurance

## 2018-07-04 ENCOUNTER — Ambulatory Visit (HOSPITAL_BASED_OUTPATIENT_CLINIC_OR_DEPARTMENT_OTHER): Payer: PPO | Attending: Anesthesiology | Admitting: Anesthesiology

## 2018-07-04 ENCOUNTER — Encounter (HOSPITAL_BASED_OUTPATIENT_CLINIC_OR_DEPARTMENT_OTHER): Payer: Self-pay | Admitting: Anesthesiology

## 2018-07-04 VITALS — BP 118/79 | HR 115 | Temp 97.2°F | Resp 20 | Ht 75.0 in | Wt 225.0 lb

## 2018-07-04 DIAGNOSIS — M542 Cervicalgia: Secondary | ICD-10-CM | POA: Insufficient documentation

## 2018-07-04 DIAGNOSIS — G629 Polyneuropathy, unspecified: Secondary | ICD-10-CM | POA: Insufficient documentation

## 2018-07-04 DIAGNOSIS — Z6828 Body mass index (BMI) 28.0-28.9, adult: Secondary | ICD-10-CM

## 2018-07-04 DIAGNOSIS — M545 Low back pain, unspecified: Secondary | ICD-10-CM

## 2018-07-04 DIAGNOSIS — G8929 Other chronic pain: Secondary | ICD-10-CM

## 2018-07-04 NOTE — Patient Instructions (Signed)
1. The key to your recovery is slowly increasing your physical activity. You will need to start low and go slow. Expect to have bad days. Speak with your PT about ideas for safe activities for the pool  2. You have so much pain all over your body, and one injection is not going to be of benefit for you at this point. I would like you to first engage in reconditioning and strengthening over the next several months, then follow up. You may also benefit from CBT as was mentioned in your rehab psych note from 2018. [facility], but you could check in the Dr[facility] Arac[facility]Pe[facility]zak [facility] since you have seen her before. I could also see you in fellows clinic, but we do not do procedures in that clinic.

## 2018-07-04 NOTE — Progress Notes (Signed)
[facility] CONS[facility] VISIT  07/04/2018   Ronnie Mason  [id number]    REFERRED BY:   Ronnie SeminoleBrian C Liem, MD  [address]  [address] [id number]  [place], [place] [id number]  PCP:  Ronnie CarlsVara V Kraft, MD (General)  [address] / [place] [phone]    CHIEF COMPLAINT: Low back, neck, foot pain  Chief Complaint   Patient presents with   . Follow-Up        HISTORY OF PRESENT ILLNESS:  Ronnie GranaDavid Paul Mason is a 67 year old male who I have seen for various chronic pain issues including low back pain, hip pain, as well as shoulder pain.  He has had multiple injections in our clinic (see list below) as well as by [facility]. I last saw [name] 10/21/16. Since then, he broke his right foot as well as had a gout flair up in that great toe which is is currently grappling with with podiatry. 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 They are not concerned about his hydrocephalus being the etiology of his gait changes or positive Rhomberg.     He was seen by Rehab psych 04/13/17 and, among other things, it was recommended that he be evaluated by pain psychology for CBT which has not occurred. He actually [facility] and that he was seeing me there today.     He is having occasional wine at dinner, but does not feel that ETOH is a problem for him currently.     Injections in our clinic have included (exluding MBB and trigger point injections):  08/12/16 - L5-S1 ILESI  07/01/16 - Ultrasound guided right trochanteric bursa injection   05/20/16 - Bilateral shoulder injections  05/13/16 - Right L3 L4 L5/S1 RFA  02/19/16 - Left L3, L4, L5/S1 RFA  12/27/12 - L5-S1 IL ESI  08/31/12 - Left L5 TFESI  03/30/12 - Left L4/5, L5/S1 facet inj  02/10/12 - Left C6 C7 MB RFA  07/03/11 - Left L3,  L4, L5/S1 RFA  05/12/11 - Left C4, C5, C6 RFA    Injections with Dr. Gustavus Mason (coppied from his note)  LEFT L5 TFESI  03-23-18 with 80-85% relief for about 3-4 weeks of just axial back pain.   Bilateral L5TFESI 09-15-17 with 50-75% relief of mixture of low back and leg pain.   RIGHT L5 TFESI 10-17-18with 100% relief of Radicular symptoms for about 3 months    Important Activity:  Working on his house    Other specialists/providers who have seen/evaluated him for pain complaints include Sports and Spine, Rehab psych, podiatry,.  Treatment for this pain complaint and associated symptoms has included:   INTERVENTIONS: see above   THERAPIES: currently in PT for foot   MEDICATIONS   OPIOID MEDICATIONS: was tapered off after fall resulting in head injury (ETOH was involoved)   ANTI-EPILEPTIC MEDICATIONS: Lyrica   MUSCLE RELAXANTS:   ANTI-DEPRESSANTS: Cymbalta   ANTI-ANXIETY:   SLEEP:   NSAIDS/OTHER PAIN/PSYCHIATRIC/SEDATING/MOOD MEDICATIONS:   OTHER TREATMENTS, INCLUDING COMPLEMENTARY AND ALTERNATIVE MEDICINE APPROACHES:    He feels that the most effective treatments are or have been  meds and shots..    Diagnostic and radiologic tests/results:  EMG testing done by Neurology 2018  showed improved polyneuropathy with no radiculopathy    PAST HISTORY:  Past Medical History:   Diagnosis Date   . Carotid Sinus Hypersensitivity    . Disc disorder of lumbosacral region    . Hip injury    . HYPERTENSION     . HYPOTENSION     . Intraparenchymal hemorrhage of brain (HCC) 2017   . Pain of right hip joint 01/23/2016   . 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 Syncope    . 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(HCC)    . URI (upper respiratory infection)      Past Surgical History:   Procedure Laterality Date   . ANES; COLONOSCOPY  2002    repeat in 3 years   . ANES; COLONOSCOPY & POLYPECTOMY  11/18/2007    repeat in 3 years   . HALLUX RIGIDUS W/CHEILECTOMY 1ST MP JT W/O IMPLT Right 10/21/2017    Dr.  Donn Pierini   . L meniciscus Left 07/2017    by Dr Marcelle Overlie , re did left menisicus    . UNLISTED PROCEDURE FEMUR/KNEE     . UNLISTED PROCEDURE HANDS/FINGERS     . UNLISTED PROCEDURE SPINE  2013    rfa to c3 to c 7      Family History     Problem (# of Occurrences) Relation (Name,Age of Onset)    Colon Cancer (1) Father    Heart (other) (1) Mother: MI    Other Family Hx (1) Other: myelodysplasia        Social History     Patient does not qualify to have social determinant information on file (likely too young).   Social History Narrative    ** Merged History Encounter **          Current Outpatient Medications   Medication Sig Dispense Refill   . Acetaminophen 500 MG Oral Tab 2 tablets bid     . amLODIPine 5 MG Oral Tablet Take 1 tablet (5 mg) by mouth 2 times a day. 90 tablet 1   . atorvastatin 10 MG Oral Tablet Take 1 tablet (10 mg) by mouth daily. 90 tablet 2   . Cholecalciferol (VITAMIN D3) 2000 units Oral Cap Take 1 capsule (2,000 Units) by mouth daily. For low level 1 capsule 1   . Cyanocobalamin 1000 MCG Oral Tab one per day over-the-counter started 5/21 /2012 this level borderline low 1 Tab 1   . diclofenac (VOLTAREN) 1 % Transdermal Gel Apply 2 g topically 4 times a day. Apply to neck, trapezius muscle, and low back. Toe  No more than 32 g applied per day total 3 Tube 4   . DULoxetine 30 MG DR capsule Take 1 capsule (30 mg) by mouth daily. 90 capsule 1   . DULoxetine 60 MG DR capsule Take 1 capsule (60 mg) by mouth daily. 90 capsule 1   . EPINEPHrine 0.3 MG/0.3ML Injection Solution Auto-injector Inject as instructed per patient package insert, 0.3 mg intramuscularly or subcutaneously into the thigh, if needed to treat anaphylaxis 3 each 1   . Lidocaine 5 % External Patch Apply 1 patch onto the skin daily. Apply to painful area for up to 12 hours in a 24 hour period. 30 patch 10   . Lisinopril 20 MG Oral Tab Take 1 tablet (20 mg) by mouth every 12 hours. 180 tablet 3   . predniSONE 50 MG tablet Take 1  tablet (50 mg) by mouth daily. 5 tablet 0   .  pregabalin 100 MG Oral Cap Take 1 capsule (100 mg) by mouth 2 times a day. 180 capsule 1   . pregabalin 50 MG Oral Cap 1 po bid 180 capsule 1   . Primidone 50 MG Oral Tab Take 1 tablet (50 mg) by mouth 2 times a day. 180 tablet 1   . THIAMINE HCL OR      . triamcinolone 0.1 % External Cream As needed to right ear bid x 1 week 1 Tube 0     No current facility-administered medications for this visit.      ALLERGIES: Adhesives; Bee venom; and Gabapentin      Past medical, surgical, and social history as above was  reviewed and updated personally with Mr. Austill.  Medications and allergies were also reviewed and confirmed personally.    REVIEW OF SYSTEMS:  ROS:   Constitutional: Positive for wt gain   Eyes: Negative    Ears, Nose, Mouth, Throat: Negative    Cardiovascular: Negative    Respiratory: Negative    Gastrointestinal: Deferred   Musculoskeletal: As noted in HPI above   Skin: Negative    Neurological: Negative    Psychiatric: Deferred   Endocrine: Deferred    BP 118/79   Pulse (!) 115   Temp 97.2 F (36.2 C) (Temporal)   Resp 20   Ht 6\' 3"  (1.905 m)   Wt (!) 225 lb (102.1 kg)   SpO2 98%   BMI 28.12 kg/m   Estimated body mass index is 28.12 kg/m as calculated from the following:    Height as of this encounter: 6\' 3"  (1.905 m).    Weight as of this encounter: 225 lb (102.1 kg).    Physical Exam    Constitutional: Oriented to person, place, and time and well-developed, well-nourished, and in no distress.   HENT:    Head: Normocephalic and atraumatic.    Eyes: Conjunctivae are normal. Right eye exhibits no discharge. Left eye exhibits no discharge   Neck: No tracheal deviation present. No thyromegaly present.   Cardiovascular: Normal rate.    Pulmonary/Chest: Effort normal. No respiratory distress.    MUSCULOSKELETAL:    Spine:  decreased lumbar lordosis  Lumbar active range of motion:   Flexion: limited; pain  Extension:  limited; pain  Right rotation +  extension: pain, Left rotation + extension: pain  Cervical spine:   Curvature:  flattened  Cervical active range of motion:  Flexion: limited; pain, Extension:  Limited ; pain  Right rotation + extension: pain, Left rotation + extension: pain    DIAGNOSES:  Patient Active Problem List    Diagnosis Date Noted   . Syncope [R55]    . Hypotension [I95.9]    . 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 URI (upper respiratory infection) [J06.9]    . Chronic left-sided low back pain without sciatica [M54.5, G89.29] 06/21/2018   . Acute left-sided low back pain without sciatica [M54.5] 03/14/2018   . Greater trochanteric pain syndrome of right lower extremity [M25.551] 11/25/2017   . Complex tear of medial meniscus of left knee as current injury [S83.232A] 09/08/2017   . Primary osteoarthritis of left knee [M17.12] 09/08/2017   . Right lumbar radiculitis [M54.16] 05/05/2017   . 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] 01/11/2017     Osteoarthritis of right hip x ray report - moderate to advanced radiology read 01/11/2017     . 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[facility]) [  S06.9X9A] 08/06/2016   . Impaired mobility and ADLs [Z74.09] 04/08/2016   . Difficulty in walking, not elsewhere classified [R26.2] 03/12/2016   . Vitamin D deficiency [E55.9] 02/13/2016   . Balance problem [R26.89] 01/23/2016   . Chronic pain of both shoulders [M25.511, G89.29, M25.512] 01/23/2016     X ray arthritis  02/19/2016       . 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 Essential hypertension [I10] 12/16/2015   . Essential tremor [G25.0] 12/16/2015   . Hx of spontan intraparenchymal intracran bleed assoc with hypertension [Z86.79] 12/16/2015     [place] [facility] 4/29-  12/10/2015  Non-traumatic intraparenchymal hemorrhage, hypertensive of the left thalamus   Traumatic hemorrhage   -- falcine subdural hemorrhage, small cortical subarachnoid     . Alcohol abuse [F10.10] 12/16/2015   . 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 History of alcohol dependence (HCC) [F10.21] 06/26/2015   . 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 Idiopathic peripheral neuropathy [G60.9] 03/15/2015   . 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 Tremor [R25.1] 04/20/2013   . Fall [W19.XXXA] 02/23/2013     BP < 100  Occasioanal tri[p and leg weakness     . Back pain, lumbosacral [M54.5] 02/15/2013   . Lumbar radiculopathy, chronic [M54.16] 02/15/2013   . Neck pain [M54.2] 02/15/2013   . Chronic pain [G89.29] 12/21/2012     Now at [facility], DO, Santiago Glad    Neck pain -burn c3-7 on left side per patient  Some trigger injection    Lumbar pain-since 80's better with exercise     . Bee sting allergy [Z91.030] 12/21/2012     hives     . Numbness and tingling of leg [R20.0, R20.2] 12/21/2012     Left calf -left foot long term suspect from back-suspect L 45     . Chronic back pain [M54.9, G89.29] 12/21/2012     Mri in mid scape 1/214  Multilevel degenerative disc disease of the lumbar spine. Circumferential disc bulge at all levels below and including L1-2. No significant central spinal canal stenosis or neuroforaminal narrowing.  2. Mild retrolisthesis of L5 on S1.           . B12 deficiency [E53.8] 12/21/2012     Borderline low 5 /21/2014     . Chronic renal insufficiency, stage III (moderate) (HCC) [N18.3] 12/21/2012     5/ 2013     . Hyperlipidemia [E78.5] 09/26/2012   . Cervicalgia [M54.2] 03/11/2010     Radiofrequency ablation of c3 to c 7         Visit Specific Diagnoses:  (M54.2) Neck pain  (primary encounter diagnosis)  (G62.9)  Sensory neuropathy  (M54.5,  G89.29) Chronic bilateral low back pain without sciatica  (G62.9) Polyneuropathy    IMPRESSION/DISCUSSION:     Monnie Anspach is a 67 year old male with chronic all over body pain. He is seen by [facility] and they sent him back to [facility] clinic to be evaluated for MBB/RFA of his lumbar spine. However, upon evaluation I do not feel that Mr. Siedschlag would have significant benefit from a nerve block at this time. I am concerned about the  amount of care Mr. Schirmer is getting from so many providers. He has a tendency to focus on one aspect of his body leading to many procedures, but I think he likely meets criteria for a centralized pain syndrome such as fibromyalgia and though that does not preclude him from injections, we should carefully weigh the risk and benefits of further interventions.     We discussed our impressions and the following suggestions/options in detail with Mr. Duchemin and  provided him with additional information in the after visit summary.  Mr. Devino had no further questions.    Recommendations:  1. The key to your recovery is slowly increasing your physical activity. You will need to start low and go slow. Expect to have bad days. Speak with your PT about ideas for safe activities for the pool  2. You have so much pain all over your body, and one injection is not going to be of benefit for you at this point. I would like you to first engage in reconditioning and strengthening over the next several months, then follow up. You may also benefit from CBT as was mentioned in your rehab psych note from 2018. We do not offer that at [facility], but you could check in the Dr. Araceli Bouche at the [facility] pain clinic since you have seen her before. I could also see you in fellows clinic, but we do not do procedures in that clinic.    Coordination of care:    We suggested that Mr. Maisie Fus discuss our consultation findings with his  PCP, Ronnie Carls, MD and other involved health  care providers.    Sherron Monday, DO

## 2018-07-05 ENCOUNTER — Ambulatory Visit (INDEPENDENT_AMBULATORY_CARE_PROVIDER_SITE_OTHER): Payer: PPO | Admitting: Podiatrist

## 2018-07-05 ENCOUNTER — Encounter (INDEPENDENT_AMBULATORY_CARE_PROVIDER_SITE_OTHER): Payer: Self-pay | Admitting: Podiatrist

## 2018-07-05 VITALS — BP 118/85 | HR 93 | Resp 18

## 2018-07-05 DIAGNOSIS — M2580 Other specified joint disorders, unspecified joint: Secondary | ICD-10-CM

## 2018-07-05 DIAGNOSIS — M2031 Hallux varus (acquired), right foot: Secondary | ICD-10-CM

## 2018-07-05 DIAGNOSIS — M25871 Other specified joint disorders, right ankle and foot: Secondary | ICD-10-CM

## 2018-07-05 DIAGNOSIS — M2021 Hallux rigidus, right foot: Secondary | ICD-10-CM

## 2018-07-05 MED ORDER — METHYLPREDNISOLONE ACETATE 40 MG/ML IJ SUSP
20.0000 mg | Freq: Once | INTRAMUSCULAR | Status: AC
Start: 2018-07-05 — End: 2018-07-05
  Administered 2018-07-05: 20 mg via INTRA_ARTICULAR

## 2018-07-05 NOTE — Patient Instructions (Signed)
The patient was cautioned regarding hypopigmentation, fat atrophy and steroid flare.   We recommend that patient take NSAID and ice on the injection for couple days.

## 2018-07-05 NOTE — Progress Notes (Signed)
Ronnie GranaDavid Paul Mason  is a 67 year old year old male who presents today for re-evaluation.    Patient's C/C - current problem is right foot pain.    The patient states that the current condition is worsening.    The current condition has existed since March 2019.  Patient describes the symptoms as worsening, his leg is buckling..    Patient denies new injury.    The condition is worse when nothing that the patient is aware of.    Past treatments/studies include orthotics, injection therapy.    The patient states no improvement.    Patient's limitation is limited in walking.

## 2018-07-05 NOTE — Progress Notes (Signed)
FOLLOW UP SOAP NOTE    Patient: Ronnie Mason   Patient DOB: Aug 22, 1950     DOS:  07/05/2018     Accompaniedby:  patient was unaccompanied    Subjective:  Ronnie Mason  is a 67 year old year old male who presents today for re-evaluation.  Patient's C/C - current problem is right significant hallux rigidus with mallet toe and plantarflexed 1st metatarsal with sesamoiditis.  The patient states that the current condition is worsening.  The current condition has existed since March 2019.Patient describes the symptoms as worsening, his leg is buckling.  Patient denies new injury.  The condition is worse when nothing that the patient is aware of.  Past treatments/studies include orthotics, injection therapy.  The patient states no improvement.  Patient's limitation is limited in walking.      Goals: evaluate current condition    PCP:  [name], MD     PMH:   Past Medical History:   Diagnosis Date    Carotid Sinus Hypersensitivity     Disc disorder of lumbosacral region     Hip injury     HYPERTENSION      HYPOTENSION      Intraparenchymal hemorrhage of brain (HCC) 2017    Pain of right hip joint 01/23/2016    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 myelopathy or radiculopathy 05/28/2014    Syncope     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(HCC)     URI (upper respiratory infection)         Review of patient's allergies indicates:  Allergies   Allergen Reactions    Adhesives Rash    Bee Venom Hives, Itching and Swelling    Gabapentin      Flu like symptoms, diarrhea, body ache upset stomach        Medications:   Outpatient Medications Prior to Visit   Medication Sig Dispense Refill    Acetaminophen 500 MG Oral Tab 2 tablets bid      amLODIPine 5 MG Oral Tablet Take 1 tablet (5 mg) by mouth 2 times a day. 90 tablet 1    atorvastatin 10 MG Oral Tablet Take 1 tablet (10 mg) by mouth daily. 90 tablet 2    Cholecalciferol (VITAMIN D3) 2000 units Oral Cap Take 1 capsule (2,000 Units) by mouth daily. For low  level 1 capsule 1    Cyanocobalamin 1000 MCG Oral Tab one per day over-the-counter started 5/21 /2012 this level borderline low 1 Tab 1    diclofenac (VOLTAREN) 1 % Transdermal Gel Apply 2 g topically 4 times a day. Apply to neck, trapezius muscle, and low back. Toe  No more than 32 g applied per day total 3 Tube 4    DULoxetine 30 MG DR capsule Take 1 capsule (30 mg) by mouth daily. 90 capsule 1    DULoxetine 60 MG DR capsule Take 1 capsule (60 mg) by mouth daily. 90 capsule 1    EPINEPHrine 0.3 MG/0.3ML Injection Solution Auto-injector Inject as instructed per patient package insert, 0.3 mg intramuscularly or subcutaneously into the thigh, if needed to treat anaphylaxis 3 each 1    Lidocaine 5 % External Patch Apply 1 patch onto the skin daily. Apply to painful area for up to 12 hours in a 24 hour period. 30 patch 10    Lisinopril 20 MG Oral Tab Take 1 tablet (20 mg) by mouth every 12 hours. 180 tablet 3    predniSONE 50 MG tablet Take 1 tablet (  50 mg) by mouth daily. 5 tablet 0    pregabalin 100 MG Oral Cap Take 1 capsule (100 mg) by mouth 2 times a day. 180 capsule 1    pregabalin 50 MG Oral Cap 1 po bid 180 capsule 1    Primidone 50 MG Oral Tab Take 1 tablet (50 mg) by mouth 2 times a day. 180 tablet 1    THIAMINE HCL OR       triamcinolone 0.1 % External Cream As needed to right ear bid x 1 week (Patient not taking: Reported on 07/05/2018) 1 Tube 0     No facility-administered medications prior to visit.         PSH:   Past Surgical History:   Procedure Laterality Date    ANES; COLONOSCOPY  2002    repeat in 3 years    ANES; COLONOSCOPY & POLYPECTOMY  11/18/2007    repeat in 3 years    HALLUX RIGIDUS W/CHEILECTOMY 1ST MP JT W/O IMPLT Right 10/21/2017    Dr. Donn Pierini    L meniciscus Left 07/2017    by Dr Marcelle Overlie , re did left menisicus     UNLISTED PROCEDURE FEMUR/KNEE      UNLISTED PROCEDURE HANDS/FINGERS      UNLISTED PROCEDURE SPINE  2013    rfa to c3 to c 7         Family  History:  family history includes Colon Cancer in his father; Heart (other) in his mother; Other Family Hx in an other family member.    Social History:  Social History     Socioeconomic History    Marital status: Married     Spouse name: Not on file    Number of children: Not on file    Years of education: Not on file    Highest education level: Not on file   Occupational History    Not on file   Social Needs    Financial resource strain: Not on file    Food insecurity:     Worry: Not on file     Inability: Not on file    Transportation needs:     Medical: Not on file     Non-medical: Not on file   Tobacco Use    Smoking status: Never Smoker    Smokeless tobacco: Former Neurosurgeon   Substance and Sexual Activity    Alcohol use: Yes    Drug use: No    Sexual activity: Not on file   Lifestyle    Physical activity:     Days per week: Not on file     Minutes per session: Not on file    Stress: Not on file   Relationships    Social connections:     Talks on phone: Not on file     Gets together: Not on file     Attends religious service: Not on file     Active member of club or organization: Not on file     Attends meetings of clubs or organizations: Not on file     Relationship status: Not on file    Intimate partner violence:     Fear of current or ex partner: Not on file     Emotionally abused: Not on file     Physically abused: Not on file     Forced sexual activity: Not on file   Other Topics Concern    Not on file   Social History Narrative    **  Merged History Encounter **             Physical Exam:   There were no vitals filed for this visit.     General: The patient is White, Well developed, appearing stated age and in no acute distress      Vascular:   RIGHT:  Dorsalis pedis pulse is palpable.  Posterior tibial pulse is palpable. Capillary filling time note to be immediate  on the distal hallux.      Neurologic:  RIGHT:  The sensation is intact on the foot/ankle.  Motor coordination is intact with  normal muscle tone.  There is no manifestations of pathological reflexes noted.      Dermatological:   RIGHT:  Upon inspection, the skin note to have normal texture/tone/turgor.  There is no ulceration/open wound or dermatosis present.  Integument coloration is within normal range and temperature is within normal range.  The hair growth is normal and decrease.     Musculoskeletal:   RIGHT: There is severe pain on palpation on dorsal 1st MTPJ and at fibular sesamoid. There is swelling noted over the affect region.  There is no warmth to touch and ecchymosis of the affected area.   There is severelimitation of ROM on 1st MTPJ with hallux mallet toe deformity and dorsal contracture of 1st MTPJ.     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   Discussion included spectrum of treatments available for this condition both conservative i.e. continue current care, cortisone injection and surgery - joint arthrodesis.   After the discussion, the patient elected cortisone injection.    Patient informed that this injection is not a permanent solution.  I believe that his continued symptoms surgical intervention probably necessary. We have reviewed previous X-ray finding and detail discussion took place with the patient.      INJECTION:  A corticosteroid injection was administered to dorsal 1st metatarso-phalangeal joint, right was performed this date to reduce symptoms.  The involved joint was prepped with Betadine and ethyl chloride was used as a topical anesthetic.  2% Lidocaine with DepoMedrol (20mg ) was then administered.  The patient was cautioned regarding hypopigmentation, fat atrophy and steroid flare.   We recommend that patient take NSAID and ice on the injection for couple days.      Detailed education  and discussion took place with the patient regards to  fibular sesamoid sesamoiditis.  We have explained to the patient the possible contributing factor to this condition - biomechanical abnormality and plantarflexed metatarsal.  We have discussed multiple treatment options i.e.- continue current care, injections and surgery  The patient elected to try injections.     INJECTION:  A corticosteroid injection was administered surrounding the tibial and fibular sesamoid.  I have prepped the injection site with three betadine prep.  Sprayed area with Ethyl Chloride.  A mixture of 2% Lidocaine with DepoMedrol (20mg ) was administered   The purpose of the injection is to reduce symptoms.      He had good relief 2nd injection.      Return appointment: No follow-ups on file.    TIME SPENT:  25 minutes was spent face-to-face with patient, greater than 50% of which was spent counseling and coordinating care, and it is separate from procedure - 1st MTPJ injection  and 2nd injection to sesamoid due residual pain. They were given an opportunity to ask questions which were answered in a detailed manner.    [name] DPM, FACFAS  Reconstructive Ankle and Foot Surgeon

## 2018-07-06 ENCOUNTER — Other Ambulatory Visit (INDEPENDENT_AMBULATORY_CARE_PROVIDER_SITE_OTHER): Payer: Self-pay | Admitting: Internal Medicine

## 2018-07-06 DIAGNOSIS — G250 Essential tremor: Secondary | ICD-10-CM

## 2018-07-06 MED ORDER — PRIMIDONE 50 MG OR TABS
50.0000 mg | ORAL_TABLET | Freq: Two times a day (BID) | ORAL | 1 refills | Status: DC
Start: 2018-07-06 — End: 2018-12-15

## 2018-07-06 NOTE — Telephone Encounter (Signed)
Primidone 50 MG Oral Tab  Last visit: 06/15/2018  Next visit: 07/25/2018  Last refill: 12/30/17  Last Prescribed by: Judi SaaVara Kraft, MD

## 2018-07-22 ENCOUNTER — Ambulatory Visit: Payer: PPO | Attending: Physical Medicine & Rehabilitation | Admitting: Physical Medicine & Rehabilitation

## 2018-07-22 VITALS — BP 146/89 | HR 86 | Ht 73.0 in | Wt 228.0 lb

## 2018-07-22 DIAGNOSIS — Z6830 Body mass index (BMI) 30.0-30.9, adult: Secondary | ICD-10-CM

## 2018-07-22 DIAGNOSIS — M1611 Unilateral primary osteoarthritis, right hip: Secondary | ICD-10-CM

## 2018-07-22 MED ORDER — MELOXICAM 7.5 MG OR TABS
7.5000 mg | ORAL_TABLET | Freq: Every morning | ORAL | 3 refills | Status: DC
Start: 2018-07-22 — End: 2018-11-16

## 2018-07-22 NOTE — Progress Notes (Signed)
CC: New issue of bilateral hip pain.     HPI:  Ronnie Mason  is a 67 year old man I last saw on [phone] for left low back pain.  At the time, I felt that his symptoms were more coming from the facet joints and had some reservations about proceeding with any further interventions.  I asked him to see Dr[facility] [name] [facility] again and also see if he would benefit from potential medial branch blocks targeting mainly the facet joints to see views or sources of pain.  I also gave him a short course of prednisone to see we can help with his acute pain.    Since our visit he saw Dr. [name] on [phone] noted that he had "chronic low over body pain."  She noted some concern about his degree of care from some a different providers and a tendency to focus on one aspect of his body leading to many procedures.  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    He reports that his back pain actually was not that bad at her last visit.  Then after some recollection, relies that he had been taking oral prednisone which helped with his symptoms.    He reports that he has a somewhat different issue today of bilateral groin and hip pain.  He has had these symptoms for the past month which have been more progressive in nature.  He notes a difficult time getting up from a seated to a standing position.  He has pain with walking.  Rates pain 7 out of 10, sharp, throbbing with a feeling of tingling and weakness.  Rates the pain 7-8 out of 10.  Recall he has a history of right hip osteoarthritis on x-rays as well as an MRI of the right hip showing this.  He also had been seen by Dr. Madaline GuthrieFernando in August 2018 and they decided to hold off on any hip replacement.    Past medical, past surgical, social, family hx: Reviewed with patient and is: As above in HPI    Physical Exam  Vital signs: There were no vitals taken for this visit.  HEENT: Normocephalic  Pulm: Non-labored respirations  Cardiovasc: No clubbing,  cyanosis, edema  Skin: No lesions in areas examined  MSK: He has good strength with resisted hip extension and knee extension.  Typical bilateral hip pain reproduced with deep hip flexion, internal rotation, FADIR.     Imaging/Diagnostic studies:  X-ray RIGHT hip 2018  - Moderate osteoarthritis of the right hip      Procedures with me.   LEFT L5 TFESI  03-23-18 with 80-85% relief for about 3-4 weeks of just axial back pain.   Bilateral L5TFESI 09-15-17 with 50-75% relief of mixture of low back and leg pain.   RIGHT L5 TFESI 10-17-18with 100% relief of Radicular symptoms for about 3 months    Procedures performed by [facility] (Dr. 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  He describes pain now more the groin and anterior thighs whereas previously he describes pain mainly in the left low back.  In reviewing his chart and imaging, he has some known right hip moderate osteoarthritis.  He has seen Dr. Madaline GuthrieFernando in consultation for hip replacement in August 2018 and was felt that they would hold off on any hip replacement.  He denies any recent traumatic event or any increase activities that would cause a flare of his hip symptoms.  We reviewed nonsurgical manager for this include physical therapy, medication management and  injections.    Given the number of injections he has had previously to other parts of his body as listed above, I discussed my duration about jumping directly into the hip joint injection which she and his wife acknowledged understanding of.  I would prefer to start with a course of anti-inflammatories and see if this can calm down.  He was just recently on a 5 day course of oral prednisone for his back so would not do this again and instead with trial meloxicam 7.5 mg daily for the next 4 weeks.  Also encouraged him to use acetaminophen  Discussed that meloxicam can certainly can raise his hypertension    Certainly, if he has no improvements with meloxicam, a right fluoroscopically guided intra-articular hip injection could be considered.  But again, I would be very careful to attribute this as the best treatment option.     I  Also had a very direct conversation with Onalee HuaDavid about his long-term pain care.  I think that Theodoro GristDave has some difficulty remembering how exactly feels was certain symptoms.  With his low back pain that I saw him about 1 month ago, he had noted that his pain was "not that bad."  My recollection was quite different in that he was complaining of worsening symptoms and that was the reason for why we put him on prednisone.  He later recall that the prednisone actually had been helping with his pain and that was the reason why he felt that his pain was not as bad.    I reviewed the recent assessment from Dr. 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  I think he would benefit the most from a comprehensive pain management approach, which would be best directed and coordinated by a pain physician.   I also think that any further injections for his LUMBAR SPINE  may be best centralized with one team and especially with a pain provider like Dr. [name].  Dr. [name] has suggested that he be seen by [facility] Arac[facility]ak at [facility] for a comprehensive pain management approach.     Park LiterBrian Maliha Outten, MD, CAQ Sports Medicine, [facility]  Clinical Assistant Professor  Sports Medicine  Attending Physician, Department of Rehabilitation Medicine  [facility]  Division of  Sports, Spine and  Orthopedic Health  [facility]

## 2018-07-22 NOTE — Patient Instructions (Addendum)
Start Meloxicam 7.5 mg tablet. Take one tablet daily in the morning for next 4 weeks. Take with food. Do not take with any other NSAID (ie: ibuprofen, advil, naproxen, Aleve, aspirin).  Okay to take and same time as acetaminophen (Tylenol) which is not an NSAID. Main side effect: Same as all other NSAIDs. Stomach upset, possible GI bleeding with prolonged use.  Long-term can potentially cause kidney/renal impairment and increase risk cor cardiac events.     In the future, if right hip pain continues, would consider a RIGHT hip joint injection under fluoroscopic guidance and then if not better, seeing Dr. Madaline GuthrieFernando again to talk about hip replacement.     Follow up with Dr. Araceli BouchePeperzak for a more comprehensive pain approach, which might include Cognitive Behavioral Therapy (CBT)    Park LiterBrian Gloria Lambertson, MD, CAQ Sports Medicine, [facility]  Attending Physician  Department of Rehabilitation Medicine  Division of Sports, Spine and Orthopedic Health  [facility]  [phone], Option #2

## 2018-07-25 ENCOUNTER — Ambulatory Visit (INDEPENDENT_AMBULATORY_CARE_PROVIDER_SITE_OTHER): Payer: PPO | Admitting: Internal Medicine

## 2018-07-25 VITALS — BP 130/88 | HR 96 | Temp 98.1°F | Resp 8 | Ht 72.99 in | Wt 228.6 lb

## 2018-07-25 DIAGNOSIS — Z8679 Personal history of other diseases of the circulatory system: Secondary | ICD-10-CM

## 2018-07-25 DIAGNOSIS — S06385S Contusion, laceration, and hemorrhage of brainstem with loss of consciousness greater than 24 hours with return to pre-existing conscious level, sequela: Secondary | ICD-10-CM

## 2018-07-25 DIAGNOSIS — M542 Cervicalgia: Secondary | ICD-10-CM

## 2018-07-25 DIAGNOSIS — G894 Chronic pain syndrome: Secondary | ICD-10-CM

## 2018-07-25 DIAGNOSIS — I10 Essential (primary) hypertension: Secondary | ICD-10-CM

## 2018-07-25 DIAGNOSIS — E782 Mixed hyperlipidemia: Secondary | ICD-10-CM

## 2018-07-25 DIAGNOSIS — Z6830 Body mass index (BMI) 30.0-30.9, adult: Secondary | ICD-10-CM

## 2018-07-25 DIAGNOSIS — R32 Unspecified urinary incontinence: Secondary | ICD-10-CM

## 2018-07-25 DIAGNOSIS — G609 Hereditary and idiopathic neuropathy, unspecified: Secondary | ICD-10-CM

## 2018-07-25 DIAGNOSIS — E538 Deficiency of other specified B group vitamins: Secondary | ICD-10-CM

## 2018-07-25 DIAGNOSIS — M79674 Pain in right toe(s): Secondary | ICD-10-CM

## 2018-07-25 MED ORDER — DULOXETINE HCL 30 MG OR CPEP
30.0000 mg | DELAYED_RELEASE_CAPSULE | Freq: Every day | ORAL | 1 refills | Status: DC
Start: 2018-07-25 — End: 2019-04-25

## 2018-07-25 MED ORDER — AMLODIPINE BESYLATE 5 MG OR TABS
5.0000 mg | ORAL_TABLET | Freq: Two times a day (BID) | ORAL | 1 refills | Status: DC
Start: 2018-07-25 — End: 2018-10-26

## 2018-07-25 MED ORDER — LISINOPRIL 20 MG OR TABS
20.0000 mg | ORAL_TABLET | Freq: Two times a day (BID) | ORAL | 3 refills | Status: DC
Start: 2018-07-25 — End: 2019-08-14

## 2018-07-25 MED ORDER — DULOXETINE HCL 60 MG OR CPEP
60.0000 mg | DELAYED_RELEASE_CAPSULE | Freq: Every day | ORAL | 1 refills | Status: DC
Start: 2018-07-25 — End: 2019-04-24

## 2018-07-25 NOTE — Progress Notes (Signed)
Ronnie Mason is a 67 year old male       BP 130/88   Pulse 96   Temp 98.1 F (36.7 C) (Oral)   Resp 8   Ht 6' 0.99" (1.854 m)   Wt (!) 228 lb 9.6 oz (103.7 kg)   SpO2 96%   BMI 30.17 kg/m   Chief Complaint   Patient presents with   . Refill Request     amlodipine, duloxetine, and lisinopril   . Foot Swelling     4 month f/u. 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    Diagnoses and all orders for this visit:    Urinary incontinence, unspecified type  -     REFERRAL TO UROLOGY    Idiopathic peripheral neuropathy    Essential hypertension  -     amLODIPine 5 MG tablet; Take 1 tablet (5 mg) by mouth 2 times a day.  -     lisinopril 20 MG tablet; Take 1 tablet (20 mg) by mouth every 12 hours.  -     Comprehensive Metabolic Panel    Chronic pain syndrome  -     DULoxetine 60 MG DR capsule; Take 1 capsule (60 mg) by mouth daily.  -     DULoxetine 30 MG DR capsule; Take 1 capsule (30 mg) by mouth daily.    Cervicalgia  -     DULoxetine 60 MG DR capsule; Take 1 capsule (60 mg) by mouth daily.  -     DULoxetine 30 MG DR capsule; Take 1 capsule (30 mg) by mouth daily.    B12 deficiency  -     Folate and Vitamin B12  -     CBC with Differential    Mixed hyperlipidemia  -     Lipid Panel    Contusion, laceration, and hemorrhage of brainstem with loss of consciousness  greater than 24 hours with return to pre-existing conscious level, sequela    H/O cerebral parenchymal hemorrhage  -     amLODIPine 5 MG tablet; Take 1 tablet (5 mg) by mouth 2 times a day.    Pain of toe of right foot       bp is good  on current medication no changes are needed  meloxicam in last 2 days for oa of hip is not sure it's helping he'll be seeing the pain clinic specialist-   Foot need to redo R w dr [name] - Patient is somewhat taking this in stride     Pending w Dr Meriam Sprague - for hemorrhoids -Referred by Dr. Charlestine Massed     He's been having urinary incontinence referral to urology I'm not sure if some of this is contributory from his history of back issues    He is on chronic pain Lyrica neck pain history and hip pain and back pain  He is telling me that he is keeping his alcohol consumption under control    Diclofenac has been helpful he is also using topical CBD creams    Blood work is indicated as multiple medications and history of B12 deficiency to make sure it stays correct on replacement      I spent a total time of 25 minutes face-to-face with the patient, of which more than 50% was spent counseling and coordinating care as outlined in this note.      No follow-ups on file.  Health Maintainace reviewed with patient today that are due  Health Maintenance   Topic Date Due   . Lipid Disorders Screening  10/13/2018   . Zoster Vaccine (2 of 3) 09/03/2018 (Originally 07/17/2013)   . Pneumococcal Vaccine: 65+ years (2 of 2 - PPSV23) 09/17/2018   . Depression Screening (PHQ-2)  07/26/2019   . Colorectal Cancer Screening (Colonoscopy)  09/24/2020   . Diabetes Screening  03/14/2021   . Tetanus Vaccine  11/29/2025   . Influenza Vaccine  Completed   . Hepatitis C Screening  Addressed   . Pneumococcal Vaccine: Pediatrics (0-5 years) and At-Risk Patients (6-64 years)  Aged Out        Patient   express understanding of care plan/medications  Created with the assistance of voice recognition software  dictation    Risk of taking medication properly and follow up discussed especially to call if problem  MEDICATION as of Discharge  Current Outpatient Medications   Medication Sig Dispense Refill   . Acetaminophen 500 MG Oral Tab 2 tablets bid     . Alpha-Lipoic Acid 300 MG tablet Take 1 tablet by mouth daily.     [name]. amLODIPine 5 MG tablet Take 1 tablet (5 mg) by mouth 2 times a day. 90 tablet 1   . atorvastatin 10 MG Oral Tablet Take 1 tablet (10 mg) by mouth daily. 90 tablet 2   . BENFOTIAMINE OR Take 250 mg by mouth daily.     . Cholecalciferol (VITAMIN D3) 2000 units Oral Cap Take 1 capsule (2,000 Units) by mouth daily. For low level 1 capsule 1   . Cyanocobalamin 1000 MCG Oral Tab one per day over-the-counter started 5/21 /2012 this level borderline low 1 Tab 1   . diclofenac 1 % gel      . DULoxetine 30 MG DR capsule Take 1 capsule (30 mg) by mouth daily. 90 capsule 1   . DULoxetine 60 MG DR capsule Take 1 capsule (60 mg) by mouth daily. 90 capsule 1   . EPINEPHrine 0.3 MG/0.3ML Injection Solution Auto-injector Inject as instructed per patient package insert, 0.3 mg intramuscularly or subcutaneously into the thigh, if needed to treat anaphylaxis 3 each 1   . Lidocaine 5 % External Patch Apply 1 patch onto the skin daily. Apply to painful area for up to 12 hours in a 24 hour period. 30 patch 10   . lisinopril 20 MG tablet Take 1 tablet (20 mg) by mouth every 12 hours. 180 tablet 3   . meloxicam 7.5 MG tablet Take 1 tablet (7.5 mg) by mouth every morning. 30 tablet 3   . pregabalin 100 MG Oral Cap Take 1 capsule (100 mg) by mouth 2 times a day. 180 capsule 1   . pregabalin 50 MG Oral Cap 1 po bid 180 capsule 1   . primidone 50 MG tablet Take 1 tablet (50 mg) by mouth 2 times a day. 180 tablet 1   .  thiamine 100 MG tablet Take 1 tablet by mouth daily.      [name] triamcinolone 0.1 % External Cream As needed to right ear bid x 1 week 1 Tube 0     No current facility-administered medications for this visit.          ________________________  Start of visit   Ronnie Mason is a 67 year old male                   BP 130/88   Pulse 96   Temp 98.1 F (36.7 C) (Oral)   Resp 8   Ht 6' 0.99" (1.854 m)   Wt (!) 228 lb 9.6 oz (103.7 kg)   SpO2 96%   BMI 30.17 kg/m   Chief Complaint   Patient presents with   . Refill Request     amlodipine, duloxetine, and lisinopril   . Foot Swelling     4 month f/u. 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/history of traumatic pain injury   Alcohol abuse in remission chronic balance issues   Patient remains on cholesterol medication will not be fasting for blood draw today   History of hypertension    Patient Care Team:  [name], MD as PCP - General (Internal Medicine)  [name], MD (Internal Medicine)  Jeanella Craze., Bryan Lemma, MD as Cardiologist  (Cardiology)  Geri Seminole, MD (Sports Medicine)  Holli Humbles, DPM (Podiatry)  Dixon Boos, MD (Orthopedic Surgery)       Today follow-up above issues:     Saw Dr Antionette Fairy has hemorrhoid and what him to see specialist -Per patient     Today he tells me he has new urine incontinence    Unable to stop urination - if dirve up , incontinence of urine if after prolong sitting  standing up or trying to run to the bathroom    House just about done    Not tired    cbd cream on foot ok    He tells me that he will need further surgery with [name]      Orders Only on 04/28/18   1. CRP, high sensitivity   Result Value Ref Range    C_Reactive Protein 2.0 0.0 - 10.0 mg/L   2. CBC with Differential   Result Value Ref Range    WBC 7.60 4.3 - 10.0 10*3/uL    RBC 4.60 4.40 - 5.60 10*6/uL    Hemoglobin 15.4 13.0 - 18.0 g/dL    Hematocrit 48 38 - 50 %    MCV  105 (H) 81 - 98 fL    MCH 33.5 27.3 - 33.6 pg    MCHC 32.0 (L) 32.2 - 36.5 g/dL    Platelet Count 161 096 - 400 10*3/uL    RDW-CV 13.6 11.6 - 14.4 %    % Neutrophils 66 %    % Lymphocytes 19 %    % Monocytes 13 %    % Eosinophils 1 %    % Basophils 1 %    % Immature Granulocytes 0 %    Neutrophils 4.95 1.80 - 7.00 10*3/uL    Absolute Lymphocyte Count 1.47 1.00 - 4.80  10*3/uL    Monocytes 1.01 (H) 0.00 - 0.80 10*3/uL    Absolute Eosinophil Count 0.08 0.00 - 0.50 10*3/uL    Basophils 0.06 0.00 - 0.20 10*3/uL    Immature Granulocytes 0.03 0.00 - 0.05 10*3/uL    Nucleated RBC 0.00 0.00 10*3/uL    % Nucleated RBC 0 %   3. Uric Acid   Result Value Ref Range    Uric Acid 5.5 3.9 - 7.6 mg/dL       No results found.    MEDICATION PRIOR TO VISIT  Reports   Outpatient Medications Prior to Visit   Medication Sig Dispense Refill   . Acetaminophen 500 MG Oral Tab 2 tablets bid     . Alpha-Lipoic Acid 300 MG tablet Take 1 tablet by mouth daily.     [name] amLODIPine 5 MG Oral Tablet Take 1 tablet (5 mg) by mouth 2 times a day. 90 tablet 1   . atorvastatin 10 MG Oral Tablet Take 1 tablet (10 mg) by mouth daily. 90 tablet 2   . BENFOTIAMINE OR Take 250 mg by mouth daily.     . Cholecalciferol (VITAMIN D3) 2000 units Oral Cap Take 1 capsule (2,000 Units) by mouth daily. For low level 1 capsule 1   . Cyanocobalamin 1000 MCG Oral Tab one per day over-the-counter started 5/21 /2012 this level borderline low 1 Tab 1   . diclofenac 1 % gel      . DULoxetine 30 MG DR capsule Take 1 capsule (30 mg) by mouth daily. 90 capsule 1   . DULoxetine 60 MG DR capsule Take 1 capsule (60 mg) by mouth daily. 90 capsule 1   . EPINEPHrine 0.3 MG/0.3ML Injection Solution Auto-injector Inject as instructed per patient package insert, 0.3 mg intramuscularly or subcutaneously into the thigh, if needed to treat anaphylaxis 3 each 1   . Lidocaine 5 % External Patch Apply 1 patch onto the skin daily. Apply to painful area for up to 12 hours in a 24 hour period. 30  patch 10   . Lisinopril 20 MG Oral Tab Take 1 tablet (20 mg) by mouth every 12 hours. 180 tablet 3   . meloxicam 7.5 MG tablet Take 1 tablet (7.5 mg) by mouth every morning. 30 tablet 3   . pregabalin 100 MG Oral Cap Take 1 capsule (100 mg) by mouth 2 times a day. 180 capsule 1   . pregabalin 50 MG Oral Cap 1 po bid 180 capsule 1   . primidone 50 MG tablet Take 1 tablet (50 mg) by mouth 2 times a day. 180 tablet 1   . thiamine 100 MG tablet Take 1 tablet by mouth daily.      [name] triamcinolone 0.1 % External Cream As needed to right ear bid x 1 week 1 Tube 0     No facility-administered medications prior to visit.              REVIEW OF SYSTEMS - are otherwise negative unless as noted in HPI above or as additional issues below  Constitutional:     Eyes:    Head ears, nose mouth throat:   Cardiovascular:   Respiratory:   Gastrointestinal:   Genitourinary:   the urine incontinence   Musculoskeletal:   carousel was bothering him ongoing back and hip issues   Integumentary:   Neurological:   Psychiatric/Behavioral:     Endocrine:   Hematological:   Allergic/Immunologic:     PHYSICAL EXAM  BP 130/88  Pulse 96   Temp 98.1 F (36.7 C) (Oral)   Resp 8   Ht 6' 0.99" (1.854 m)   Wt (!) 228 lb 9.6 oz (103.7 kg)   SpO2 96%   BMI 30.17 kg/m   Well Dressed     Constitution, In general looks:   well , good coloring  No diaphoresis  Patient is Alert  & Attentive  Patient is conversive    Psychiatric Mental Status / Neurologic Mental Status:  Affect:  Pleasant calm  Mood:  Good  Awake: Alert and attentive    Not confused  Thought::     content : no gross issue   Process : logical  Language:   Speech is fluent  Intact comprehension    Eyes: no discharge, no gross puffiness, non- icteric    Neck:    Thyroid not enlarged    Cardiovascular:   Heart Regular rate , rhythm, no murmur  No edema  Respiratory:   Not tachyphpnic  Clear to ascultation  No rales, rhonchi , without increased effort or stridor  No clubbing     Abdomen /  Gastrointestinal:  Soft. No masses. No guarding. No rebound  No gross bulge or hernia. Abdomen, groin  No bruit or pulsatile mass  Genitourinary:  No suprapubic tenderness  No increased flank pain with percussion    Neurologic:   Grossly intact  Memory:   Attention       Cranial Nerve    Eyes pupil and lid are intact   II, III, IV, VIII   Facial Movement VII: grossly intact, no facial droop     Motor- Gait-coordination: get up and go-he started on his right toe     it is pink where he's had 2 previous surgical scar but it's not infected    Musculoskeletal:  No gross abnormal movement, twitch       Skin:  No gross lesion or rash    Hematologic/Lymphatic /Immunologic:  No gross bruising or rash or hives     ++++++++++++++++++++++++++++++++++++++++++++++++++  Allergy  Review of patient's allergies indicates:  Allergies   Allergen Reactions   . Adhesives Rash   . Bee Venom Hives, Itching and Swelling   . Gabapentin      Flu like symptoms, diarrhea, body ache upset stomach       ++++++++++++++++++++++++++++++++++++++++++++++++++    Patient Active Problem List    Diagnosis Date Noted   . Syncope [R55]    . Hypotension [I95.9]    . 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 URI (upper respiratory infection) [J06.9]    . Chronic left-sided low back pain without sciatica [M54.5, G89.29] 06/21/2018   . Acute left-sided low back pain without sciatica [M54.5] 03/14/2018   . Greater trochanteric pain syndrome of right lower extremity [M25.551] 11/25/2017   . Complex tear of medial meniscus of left knee as current injury [S83.232A] 09/08/2017   . Primary osteoarthritis of left knee [M17.12] 09/08/2017   . Right lumbar radiculitis [M54.16] 05/05/2017   . 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] 01/11/2017     Osteoarthritis of right hip x ray report - moderate to advanced radiology read 01/11/2017     . 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 Impaired mobility and ADLs [Z74.09] 04/08/2016   . Difficulty in walking, not elsewhere classified [R26.2] 03/12/2016   . Vitamin D deficiency [E55.9] 02/13/2016   . Balance problem [R26.89]  01/23/2016   . Chronic pain of both shoulders [M25.511, G89.29, M25.512] 01/23/2016     X ray arthritis  02/19/2016       . 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 Essential hypertension [I10] 12/16/2015   . Essential tremor [G25.0] 12/16/2015   . Hx of spontan intraparenchymal intracran bleed assoc with hypertension [Z86.79] 12/16/2015     [place] 4/29- 12/10/2015  Non-traumatic intraparenchymal hemorrhage, hypertensive of the left thalamus   Traumatic hemorrhage   -- falcine subdural hemorrhage, small cortical subarachnoid     . Alcohol abuse [F10.10] 12/16/2015   . 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 History of alcohol dependence (HCC) [F10.21] 06/26/2015   . 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 Idiopathic peripheral neuropathy [G60.9] 03/15/2015   . 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 Tremor [R25.1] 04/20/2013   . Fall [W19.XXXA] 02/23/2013     BP < 100  Occasioanal tri[p and leg weakness     . Back pain, lumbosacral [M54.5] 02/15/2013   . Lumbar radiculopathy, chronic [M54.16] 02/15/2013   . Neck pain [M54.2] 02/15/2013   . Chronic pain [G89.29] 12/21/2012     Now at [place], DO, Santiago Glad    Neck pain -burn c3-7 on left side per patient  Some trigger injection    Lumbar pain-since 80's better with exercise     . Bee sting allergy [Z91.030] 12/21/2012     hives     . Numbness and tingling of leg [R20.0, R20.2]  12/21/2012     Left calf -left foot long term suspect from back-suspect L 45     . Chronic back pain [M54.9, G89.29] 12/21/2012     Mri in mid scape 1/214  Multilevel degenerative disc disease of the lumbar spine. Circumferential disc bulge at all levels below and including L1-2. No significant central spinal canal stenosis or neuroforaminal narrowing.  2. Mild retrolisthesis of L5 on S1.           . B12 deficiency [E53.8] 12/21/2012     Borderline low 5 /21/2014     . Chronic renal insufficiency, stage III (moderate) (HCC) [N18.3] 12/21/2012     5/ 2013     . Hyperlipidemia [E78.5] 09/26/2012   . Cervicalgia [M54.2] 03/11/2010     Radiofrequency ablation of c3 to c 7           Past Medical History:   Diagnosis Date   . Carotid Sinus Hypersensitivity    . Disc disorder of lumbosacral region    . Hip injury    . HYPERTENSION     . HYPOTENSION     . Intraparenchymal hemorrhage of brain (HCC) 2017   . Pain of right hip joint 01/23/2016   . 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 Syncope    . 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(HCC)    . URI (upper respiratory infection)        Social History     Socioeconomic History   . Marital status: Married     Spouse name: Not on file   . Number of children: Not on file   . Years of education: Not on file   .  Highest education level: Not on file   Occupational History   . Not on file   Social Needs   . Financial resource strain: Not on file   . Food insecurity:     Worry: Not on file     Inability: Not on file   . Transportation needs:     Medical: Not on file     Non-medical: Not on file   Tobacco Use   . Smoking status: Never Smoker   . Smokeless tobacco: Former Estate agentUser   Substance and Sexual Activity   . Alcohol use: Yes   . Drug use: No   . Sexual activity: Not on file   Lifestyle   . Physical activity:     Days per week: Not on file     Minutes per session: Not on file   . Stress: Not on file   Relationships   . Social connections:     Talks on  phone: Not on file     Gets together: Not on file     Attends religious service: Not on file     Active member of club or organization: Not on file     Attends meetings of clubs or organizations: Not on file     Relationship status: Not on file   . Intimate partner violence:     Fear of current or ex partner: Not on file     Emotionally abused: Not on file     Physically abused: Not on file     Forced sexual activity: Not on file   Other Topics Concern   . Not on file   Social History Narrative    ** Merged History Encounter **              Family History     Problem (# of Occurrences) Relation (Name,Age of Onset)    Colon Cancer (1) Father    Heart (other) (1) Mother: MI    Other Family Hx (1) Other: myelodysplasia          Social History     Socioeconomic History   . Marital status: Married     Spouse name: Not on file   . Number of children: Not on file   . Years of education: Not on file   . Highest education level: Not on file   Occupational History   . Not on file   Social Needs   . Financial resource strain: Not on file   . Food insecurity:     Worry: Not on file     Inability: Not on file   . Transportation needs:     Medical: Not on file     Non-medical: Not on file   Tobacco Use   . Smoking status: Never Smoker   . Smokeless tobacco: Former Estate agentUser   Substance and Sexual Activity   . Alcohol use: Yes   . Drug use: No   . Sexual activity: Not on file   Lifestyle   . Physical activity:     Days per week: Not on file     Minutes per session: Not on file   . Stress: Not on file   Relationships   . Social connections:     Talks on phone: Not on file     Gets together: Not on file     Attends religious service: Not on file     Active member of club or organization: Not on file  Attends meetings of clubs or organizations: Not on file     Relationship status: Not on file   . Intimate partner violence:     Fear of current or ex partner: Not on file     Emotionally abused: Not on file     Physically abused: Not on  file     Forced sexual activity: Not on file   Other Topics Concern   . Not on file   Social History Narrative    ** Merged History Encounter **

## 2018-07-26 ENCOUNTER — Encounter (INDEPENDENT_AMBULATORY_CARE_PROVIDER_SITE_OTHER): Payer: Self-pay | Admitting: Internal Medicine

## 2018-08-04 ENCOUNTER — Telehealth (HOSPITAL_BASED_OUTPATIENT_CLINIC_OR_DEPARTMENT_OTHER): Payer: Self-pay | Admitting: Anesthesiology

## 2018-08-04 NOTE — Telephone Encounter (Signed)
RETURN CALL: Voicemail - Detailed Message      SUBJECT:  Referral Request/Questions      REASON FOR REFERRAL: CBT and Pain Management  NAME OF CLINIC/SPECIALTY: [facility]  PROVIDER: [name]  PHONE: n/a  FAX: n/a  ADDITIONAL INFORMATION: Patient states that Dr [name] sent over a referral on his behalf to be able to be seen by Dr [facility]zak [facility], but referral is not found in Epic.

## 2018-08-05 NOTE — Telephone Encounter (Addendum)
Patient is calling back to schedule CCR is unable to schedule per the SM Please call him directly to schedule the needed appointment for follow up. And Amada JupiterDale and Gustavus BryantLiem suggested to go back to [employer] (no offical referral in system)

## 2018-08-08 ENCOUNTER — Telehealth (HOSPITAL_BASED_OUTPATIENT_CLINIC_OR_DEPARTMENT_OTHER): Payer: Self-pay

## 2018-08-08 NOTE — Telephone Encounter (Signed)
Called patient gave him ph[facility] for [facility], patient should follow up their per Dr [name] notes from 07-04-18

## 2018-08-08 NOTE — Telephone Encounter (Signed)
Friday 08-05-18 1:50pm patient called left message stating seeking appt with Dr Araceli Bouche @ [facility],     Friday 08-05-18 1:57 CCR Correne left message patient seeking appt with Dr Araceli Bouche

## 2018-08-08 NOTE — Telephone Encounter (Signed)
Called and left pt message. Please warm transfer to the clinic to schedule.

## 2018-08-10 ENCOUNTER — Encounter (INDEPENDENT_AMBULATORY_CARE_PROVIDER_SITE_OTHER): Payer: PPO | Admitting: Podiatrist

## 2018-08-12 ENCOUNTER — Encounter (INDEPENDENT_AMBULATORY_CARE_PROVIDER_SITE_OTHER): Payer: PPO | Admitting: Podiatrist

## 2018-08-15 ENCOUNTER — Other Ambulatory Visit (HOSPITAL_COMMUNITY): Admit: 2018-08-15 | Payer: PPO

## 2018-08-15 ENCOUNTER — Ambulatory Visit (INDEPENDENT_AMBULATORY_CARE_PROVIDER_SITE_OTHER): Payer: PPO | Admitting: Physician Assistant

## 2018-08-15 ENCOUNTER — Other Ambulatory Visit
Admit: 2018-08-15 | Discharge: 2018-08-15 | Disposition: A | Discharge status: Home / Self Care | Payer: PPO | Attending: Internal Medicine | Admitting: Internal Medicine

## 2018-08-15 VITALS — BP 135/85 | HR 101 | Temp 98.3°F | Resp 16 | Wt 234.4 lb

## 2018-08-15 DIAGNOSIS — E782 Mixed hyperlipidemia: Secondary | ICD-10-CM | POA: Insufficient documentation

## 2018-08-15 DIAGNOSIS — I10 Essential (primary) hypertension: Secondary | ICD-10-CM | POA: Insufficient documentation

## 2018-08-15 DIAGNOSIS — E538 Deficiency of other specified B group vitamins: Secondary | ICD-10-CM | POA: Insufficient documentation

## 2018-08-15 DIAGNOSIS — R0989 Other specified symptoms and signs involving the circulatory and respiratory systems: Secondary | ICD-10-CM

## 2018-08-15 LAB — CBC, DIFF
% Basophils: 1 %
% Eosinophils: 2 %
% Immature Granulocytes: 1 %
% Lymphocytes: 19 %
% Monocytes: 9 %
% Neutrophils: 68 %
% Nucleated RBC: 0 %
Absolute Eosinophil Count: 0.16 10*3/uL (ref 0.00–0.50)
Absolute Lymphocyte Count: 1.45 10*3/uL (ref 1.00–4.80)
Basophils: 0.05 10*3/uL (ref 0.00–0.20)
Hematocrit: 42 % (ref 38–50)
Hemoglobin: 14 g/dL (ref 13.0–18.0)
Immature Granulocytes: 0.04 10*3/uL (ref 0.00–0.05)
MCH: 34.5 pg — ABNORMAL HIGH (ref 27.3–33.6)
MCHC: 33.3 g/dL (ref 32.2–36.5)
MCV: 103 fL — ABNORMAL HIGH (ref 81–98)
Monocytes: 0.73 10*3/uL (ref 0.00–0.80)
Neutrophils: 5.3 10*3/uL (ref 1.80–7.00)
Nucleated RBC: 0 10*3/uL
Platelet Count: 219 10*3/uL (ref 150–400)
RBC: 4.06 10*6/uL — ABNORMAL LOW (ref 4.40–5.60)
RDW-CV: 13.3 % (ref 11.6–14.4)
WBC: 7.73 10*3/uL (ref 4.3–10.0)

## 2018-08-15 LAB — COMPREHENSIVE METABOLIC PANEL
ALT (GPT): 19 U/L (ref 10–48)
AST (GOT): 24 U/L (ref 9–38)
Albumin: 4.1 g/dL (ref 3.5–5.2)
Alkaline Phosphatase (Total): 99 U/L (ref 36–161)
Anion Gap: 12 (ref 4–12)
Bilirubin (Total): 0.6 mg/dL (ref 0.2–1.3)
Calcium: 9 mg/dL (ref 8.9–10.2)
Carbon Dioxide, Total: 26 meq/L (ref 22–32)
Chloride: 104 meq/L (ref 98–108)
Creatinine: 0.88 mg/dL (ref 0.51–1.18)
GFR, Calc, African American: >60 mL/min/{1.73_m2} (ref >59)
GFR, Calc, European American: >60 mL/min/{1.73_m2} (ref >59)
Glucose: 96 mg/dL (ref 62–125)
Potassium: 4.3 meq/L (ref 3.6–5.2)
Protein (Total): 6.7 g/dL (ref 6.0–8.2)
Sodium: 142 meq/L (ref 135–145)
Urea Nitrogen: 27 mg/dL — ABNORMAL HIGH (ref 8–21)

## 2018-08-15 LAB — LIPID PANEL
Cholesterol (LDL): 69 mg/dL (ref <130)
Cholesterol/HDL Ratio: 2
HDL Cholesterol: 101 mg/dL (ref >39)
Non-HDL Cholesterol: 100 mg/dL (ref 0–159)
Total Cholesterol: 201 mg/dL — ABNORMAL HIGH (ref <200)
Triglyceride: 156 mg/dL — ABNORMAL HIGH (ref <150)

## 2018-08-15 MED ORDER — ALBUTEROL SULFATE HFA 108 (90 BASE) MCG/ACT IN AERS
2.0000 | INHALATION_SPRAY | RESPIRATORY_TRACT | 2 refills | Status: DC | PRN
Start: 2018-08-15 — End: 2019-12-02

## 2018-08-15 MED ORDER — BENZONATATE 200 MG OR CAPS
200.0000 mg | ORAL_CAPSULE | Freq: Every evening | ORAL | 0 refills | Status: DC | PRN
Start: 2018-08-15 — End: 2019-01-26

## 2018-08-15 MED ORDER — DOXYCYCLINE MONOHYDRATE 100 MG OR CAPS
100.0000 mg | ORAL_CAPSULE | Freq: Two times a day (BID) | ORAL | 0 refills | Status: AC
Start: 2018-08-15 — End: 2018-08-25

## 2018-08-15 NOTE — Progress Notes (Signed)
Above nursing notes reviewed by Twanna Hy, PA-C, 1:18 PM     Chief Complaint   Patient presents with   . URI     Pt states cough,sore throat, fatigue x 5 days     SUBJECTIVE:  History of Present Illness:  Ronnie Mason is a 68 year old male who presents c/o sinus congestion, rhinorrhea, sore throat, sunjective fevers/chills, intially productive, now dry cough a/w mild SOB and body aches x 5 days. He did get a flu shot this year. He has tried rest, increasing fluids, OTC Nyquil, Dayquil w/ "pretty good" temporary s/sx improvement.    Pt denies rash, CP/SOB, hemoptysis, calf pain/swelling, n/v/d, recent ABx use, recent hospitalizations, recent steroid use, abdominal pain and hx of chronic liver or kidney dz, recent lisinopril dose changes, recent travel, hx of asthma or COPD.    Review of Systems:  General: see HPI  ENT: see HPI  Resp: see HPI  CV: negative  GI: negative  MSK: negative    Social History:  Social History     Tobacco Use   . Smoking status: Never Smoker   . Smokeless tobacco: Former Estate agent Use Topics   . Alcohol use: Yes   . Drug use: No       Past Medical History:  Past Medical History:   Diagnosis Date   . Carotid Sinus Hypersensitivity    . Disc disorder of lumbosacral region    . Hip injury    . HYPERTENSION     . HYPOTENSION     . Intraparenchymal hemorrhage of brain (HCC) 2017   . Pain of right hip joint 01/23/2016   . 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 Syncope    . 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(HCC)    . URI (upper respiratory infection)       Allergies:  Review of patient's allergies indicates:  Allergies   Allergen Reactions   . Adhesives Rash   . Bee Venom Hives, Itching and Swelling   . Gabapentin      Flu like symptoms, diarrhea, body ache upset stomach     Surgical History:  Past Surgical History:   Procedure Laterality Date   . ANES; COLONOSCOPY  2002    repeat in 3 years   . ANES; COLONOSCOPY & POLYPECTOMY  11/18/2007     repeat in 3 years   . HALLUX RIGIDUS W/CHEILECTOMY 1ST MP JT W/O IMPLT Right 10/21/2017    Dr. Donn Pierini   . L meniciscus Left 07/2017    by Dr Marcelle Overlie , re did left menisicus    . UNLISTED PROCEDURE FEMUR/KNEE     . UNLISTED PROCEDURE HANDS/FINGERS     . UNLISTED PROCEDURE SPINE  2013    rfa to c3 to c 7      Family History:  Family History     Problem (# of Occurrences) Relation (Name,Age of Onset)    Colon Cancer (1) Father    Heart (other) (1) Mother: MI    Other Family Hx (1) Other: myelodysplasia        Medications Currently Taking:  Prescription:    Current Outpatient Medications   Medication Sig Dispense Refill   . Acetaminophen 500 MG Oral Tab 2 tablets bid     . Alpha-Lipoic Acid 300 MG tablet Take 1 tablet by mouth daily.     [name] amLODIPine 5 MG tablet Take 1 tablet (5 mg) by mouth 2 times  a day. 90 tablet 1   . atorvastatin 10 MG Oral Tablet Take 1 tablet (10 mg) by mouth daily. 90 tablet 2   . BENFOTIAMINE OR Take 250 mg by mouth daily.     . Cholecalciferol (VITAMIN D3) 2000 units Oral Cap Take 1 capsule (2,000 Units) by mouth daily. For low level 1 capsule 1   . Cyanocobalamin 1000 MCG Oral Tab one per day over-the-counter started 5/21 /2012 this level borderline low 1 Tab 1   . diclofenac 1 % gel      . DULoxetine 30 MG DR capsule Take 1 capsule (30 mg) by mouth daily. 90 capsule 1   . DULoxetine 60 MG DR capsule Take 1 capsule (60 mg) by mouth daily. 90 capsule 1   . EPINEPHrine 0.3 MG/0.3ML Injection Solution Auto-injector Inject as instructed per patient package insert, 0.3 mg intramuscularly or subcutaneously into the thigh, if needed to treat anaphylaxis 3 each 1   . Lidocaine 5 % External Patch Apply 1 patch onto the skin daily. Apply to painful area for up to 12 hours in a 24 hour period. 30 patch 10   . lisinopril 20 MG tablet Take 1 tablet (20 mg) by mouth every 12 hours. 180 tablet 3   . meloxicam 7.5 MG tablet Take 1 tablet (7.5 mg) by mouth every morning. 30 tablet 3   . pregabalin 100  MG Oral Cap Take 1 capsule (100 mg) by mouth 2 times a day. 180 capsule 1   . pregabalin 50 MG Oral Cap 1 po bid 180 capsule 1   . primidone 50 MG tablet Take 1 tablet (50 mg) by mouth 2 times a day. 180 tablet 1   . thiamine 100 MG tablet Take 1 tablet by mouth daily.      [name] triamcinolone 0.1 % External Cream As needed to right ear bid x 1 week 1 Tube 0     No current facility-administered medications for this visit.      VITALS:  BP 135/85   Pulse (!) 101   Temp 98.3 F (36.8 C) (Temporal)   Resp 16   Wt (!) 234 lb 6.4 oz (106.3 kg)   SpO2 95%   BMI 30.93 kg/m     PHYSICAL EXAM:  GENERAL APPEARANCE: WD/WN, non-toxic appearing, alert, in NAD.   HEAD: NC/AT.   EYES: Conjunctivae non-injected. No discharge. Sclerae anicteric.  ENT: Oropharyngeal MMM. Posterior pharynx is w/ mild cobblestoning, w/o erythema, edema, tonsillar hypertrophy or exudates. Uvula midline; airway is intact.   Bilat TMs bulging slightly, with clear landmarks, w/o erythema or effusions  No TTP noted over maxillary or frontal sinsues.   NECK: No cervical adenopathy, supple, non-tender.  LUNGS: Congested -sounding cough noted w/ slightly prolonged, 1:2 I:E Ratio. Good inspiratory effort and air entry w/o accessory mm use or stridor. Rhonchi noted to RLLF w/o rales or wheezes.  CV: RRR, w/o M/R/G. No JVD noted.  SKIN: Warm, dry  EXTREMITIES: No edema, no cyanosis.    MSK: Good muscle strength, normal tone, steady gait, coordination normal. ROM full.    CXR: FUTURE ORDERED, no XR capability in clinic today. Pt will RTC tomorrow for CXR    LABS  CBC w/ diff, CMP, Folate/B12, Lipid panel were future-ordered per pt's PCP, will release orders. Pt is non-fasting, expect triglycerides will be artificially elevated.    ASSESSMENT AND PLAN  [name] is a 68 year old male presenting to clinic today with the following medical  problems:     1. Rhonchi at right lung base  - [age] yoM w/ hx of Stage III CKD (last CMP w/ normal GFR, Aug' 19),  never-smoker c/o 5 days moderate productive cough, tactile and unmeasured fevers/chills, night sweats. Pt is stable, non-toxic appearing, w/ slightly prolonged expiratory phase and rhonchi noted isolated to RLLF. Afebrile in clinic. No CXR capability d/t staff call-out today. Pt will RTC tomorrow. Will treat presumptively for RLL CA-PNA. PORT/PSI score = moderate risk d/t CKD hx alone. Will release CBC and CMP for further risk stratification. Will start Doxycycline, Albuterol, PRN Benzonatate. Pt given clear ER and F/U precautions; he voiced his understanding.  - XR CHEST 2 VW; Future  - albuterol HFA 108 (90 Base) MCG/ACT inhaler; Inhale 2 puffs by mouth every 4 hours as needed for shortness of breath/wheezing.  Dispense: 1 Inhaler; Refill: 2  - doxycycline monohydrate 100 MG capsule; Take 1 capsule (100 mg) by mouth 2 times a day for 10 days. Take until gone.  Dispense: 20 capsule; Refill: 0  - benzonatate 200 MG capsule; Take 1 capsule (200 mg) by mouth at bedtime as needed for cough.  Dispense: 30 capsule; Refill: 0  - Please RETURN TO THE CLINIC TOMORROW or proceed to any [facility] location for Chest X-ray    - We will contact you with your lab results in 2-3 days    - In the meantime . .   - Please BEGIN the Doxycycline as prescribed  - Please BEGIN the Albuterol as needed for coughing fits/wheeze/shortness of breath  - Please BEGIN the Benzonatate AS NEEDED at [facility] to help suppress cough while you sleep    - Sinus rinses with warmed nasal saline (using DISTILLED water- NEVER tap water!) with at least 200 mL of saline, up to four times per day as needed. There are numerous OTC nasal saline rinses available (neti-pot, etc); you may use which ever one you prefer, but most patients prefer the "squeeze-bottle" versions over the "tea-pot shaped" versions.     - After the saline rinse; you may also use Fluticasone Propionate (flonase) nasal spray: 1-2 sprays in each nostril every 12 hrs, tapering to one spray in each  nostril, once daily as tolerated. We discussed proper use of your Flonase, please remember that you should not be able to taste the medication! If you do then you know you have used it incorrectly. Remember to spray THEN sniff!     You may also:  - Rest, get plenty of fluids  - You may use 200-400 mg of ibuprofen every 4-6 hours as needed for fever or body aches, you may also alternate with 325 mg of Tylenol every 4-6 hours, in between doses of ibuprofen as needed. DO NOT exceed 3000 mg of Tylenol in a 24 hr period.  - You may use an OTC Expectorant (Mucinex/Guaifenesin) to help thin secretions and make it easier to cough phlegm/mucus up and out. You may also use an OTC non-drowsy, once-daily anti-histamine (Zyrtec/Cetirizine, Allegra/Fexofenadine, or Claritin/Loratadine) to help dry-up mucus secretions in your upper airway.     Many patients also experience relief from cough/sore throat symptoms with Home Remedies such as:    - Warm compresses applied to the sinuses (under the eyes, and above the bridge of the nose)  - Warm Tea with honey and/or ginger  - Warm Salt Water Gargles  - OTC Throat Sprays or lozenges (chloroseptic, cepacol, etc)  - Warm humidifier in the bedroom at night or a  warm, steamy shower/bath before bed  - Vix vapor rub or similar products  - Diffusing Eucalyptus essential oil  - A warm, steamy shower/bath before bed  - A cool-mist humidifier and/or HEPA filter in the bedroom  - Closing all bedroom windows, and keeping any pets out of the bedroom    If your symptoms worsen (especially after starting on the Antibiotics), or are associated with ongoing high fever (>100.4*F), swelling around the eyes, difficulty speaking, sore throat, masses in the mouth/throat, wheezing, shortness or breath, worsening Asthma symptoms, chest pain or pressure, coughing-up blood, or swelling in the feet or legs, please call 911 and/or seek immediate medical attention in the Emergency Department.    - If your symptoms  worsen or don't improve, please return to the Urgent Care clinic or see your PCP.     [facility]! We hope you feel better soon!    AVS was discussed and a copy given to the patient. Questions were encouraged and answered. The patient agreed with the above stated plan of care.    Twanna HyAlexander M Isiah Scheel, PA-C  Physician Assistant  [facility]

## 2018-08-15 NOTE — Patient Instructions (Signed)
-   Please RETURN TO THE CLINIC TOMORROW or proceed to any [facility] location for Chest X-ray    - We will contact you with your lab results in 2-3 days    - In the meantime . .   - Please BEGIN the Doxycycline as prescribed  - Please BEGIN the Albuterol as needed for coughing fits/wheeze/shortness of breath  - Please BEGIN the Benzonatate AS NEEDED at [facility] to help suppress cough while you sleep    - Sinus rinses with warmed nasal saline (using DISTILLED water- NEVER tap water!) with at least 200 mL of saline, up to four times per day as needed. There are numerous OTC nasal saline rinses available (neti-pot, etc); you may use which ever one you prefer, but most patients prefer the "squeeze-bottle" versions over the "tea-pot shaped" versions.     - After the saline rinse; you may also use Fluticasone Propionate (flonase) nasal spray: 1-2 sprays in each nostril every 12 hrs, tapering to one spray in each nostril, once daily as tolerated. We discussed proper use of your Flonase, please remember that you should not be able to taste the medication! If you do then you know you have used it incorrectly. Remember to spray THEN sniff!     You may also:  - Rest, get plenty of fluids  - You may use 200-400 mg of ibuprofen every 4-6 hours as needed for fever or body aches, you may also alternate with 325 mg of Tylenol every 4-6 hours, in between doses of ibuprofen as needed. DO NOT exceed 3000 mg of Tylenol in a 24 hr period.  - You may use an OTC Expectorant (Mucinex/Guaifenesin) to help thin secretions and make it easier to cough phlegm/mucus up and out. You may also use an OTC non-drowsy, once-daily anti-histamine (Zyrtec/Cetirizine, Allegra/Fexofenadine, or Claritin/Loratadine) to help dry-up mucus secretions in your upper airway.     Many patients also experience relief from cough/sore throat symptoms with Home Remedies such as:    - Warm compresses applied to the sinuses (under the eyes, and above the bridge of the nose)  -  Warm Tea with honey and/or ginger  - Warm Salt Water Gargles  - OTC Throat Sprays or lozenges (chloroseptic, cepacol, etc)  - Warm humidifier in the bedroom at night or a warm, steamy shower/bath before bed  - Vix vapor rub or similar products  - Diffusing Eucalyptus essential oil  - A warm, steamy shower/bath before bed  - A cool-mist humidifier and/or HEPA filter in the bedroom  - Closing all bedroom windows, and keeping any pets out of the bedroom    If your symptoms worsen (especially after starting on the Antibiotics), or are associated with ongoing high fever (>100.4*F), swelling around the eyes, difficulty speaking, sore throat, masses in the mouth/throat, wheezing, shortness or breath, worsening Asthma symptoms, chest pain or pressure, coughing-up blood, or swelling in the feet or legs, please call 911 and/or seek immediate medical attention in the Emergency Department.    - If your symptoms worsen or don't improve, please return to the Urgent Care clinic or see your PCP.     Thank you for choosing [facility]! We hope you feel better soon!

## 2018-08-16 ENCOUNTER — Encounter (INDEPENDENT_AMBULATORY_CARE_PROVIDER_SITE_OTHER): Payer: Self-pay | Admitting: Internal Medicine

## 2018-08-16 LAB — FOLATE & VITAMIN B12
Folate, SRM: 6.2 ng/mL (ref >5.8)
Vitamin B12 (Cobalamin): 1378 pg/mL — ABNORMAL HIGH (ref 180–914)

## 2018-08-17 ENCOUNTER — Ambulatory Visit (INDEPENDENT_AMBULATORY_CARE_PROVIDER_SITE_OTHER): Payer: PPO

## 2018-08-17 DIAGNOSIS — R0989 Other specified symptoms and signs involving the circulatory and respiratory systems: Secondary | ICD-10-CM

## 2018-08-18 ENCOUNTER — Encounter (INDEPENDENT_AMBULATORY_CARE_PROVIDER_SITE_OTHER): Payer: Self-pay | Admitting: Physician Assistant

## 2018-08-18 NOTE — Telephone Encounter (Signed)
I reviewed the visit note from 08/15/18.     Agree with providers recommendations to try tessalon perles that helps suppress the cough but may not completely resolve it.     There aren't any medications to eliminate the cough but the following supportive care: teaspoon honey to soothe the throat or drinking warm tea with honey, using a humidifier at bedside, increased water intake to stay hydrated.

## 2018-08-18 NOTE — Telephone Encounter (Signed)
Pt sent a ecare message saying his cough is persistent and dry and hurts. Pt wants to know what can he do for relief. Pt had x-ray done. Pt was seen on 08/15/2018    Please advise

## 2018-08-18 NOTE — Progress Notes (Signed)
Apollo Macher;  MRN: [id number];   DOB: Aug 15, 1950    CHIEF COMPLAINT:   Right leg pain, below the knee    HISTORY OF PRESENT ILLNESS:  The patient presents to today for interval reevaluation of chronic low back pain, specifically at L4-S1. The patient has a pain history significant for low back pain with radicular leg pain that has been managed by multiple providers in multiple clinics. The patient was last seen by Dr. Araceli Bouche on 10/16/15 at which time we recommended PT and physiatry referral. Since that time he has followed with Dr. [name] and Dr. Amada Jupiter who both feel he has multiple pain complaints that may individually improve with intervention, but concern exists about the number of pain procedures he is receiving over the years that may be a smaller component of an overall central sensitization syndrome like fibromyalgia.    Today the patient's pain is rated as 7/10 in severity on the verbal numeric rating scale.     The pain is located back, lower legs and neck. He had mild hip/groin pain that is now improved.   His right foot is the worst pain he has, which is now s/p surgery on his R big toe March 2019, fell down a rabbit hole that went up to his knee. Also sprained his ankle, diagnosed with a lateral malleolus fracture s/p boot, now stable. He receives epidurals frequently with Dr. [name] which he feels are very helpful. Uses a SPC for 2 years for balance in uneven terrain. Uses a custom foot orthotic in any shoe he wears, has a R metatarsal pad.    He has "neuropathy" in his right leg, that started a few years ago, just affects his lateral leg and foot. He had a "neuropathy" in his left leg 5+ years ago that is less painful than the left.    He describes his pain as dull, constant in his right leg. Stabs when turning/twisting his ankle.   He feels the pain is  gradually worsening.  The pain is made better by medications/injectiosn.  The pain is made worse by activity.  Sleeps well.    "My diet is not great."  Note they are not eating as much vegetables as they should or used to.    REVIEW OF SYSTEMS:  The Review of Systems was provided by Mr. Drotar and entered in the [place] note associated with this encounter.  I personally reviewed and confirmed the ROS information with Mr. Gamarra and have the following additional comments:  none.    Important Activity:  Working on his house    Other specialists/providers who have seen/evaluated him for pain complaints include Sports and Spine, Rehab psych, podiatry,.  Treatment for this pain complaint and associated symptoms has included:   INTERVENTIONS: see above   THERAPIES: currently in PT for foot   MEDICATIONS   OPIOID MEDICATIONS: was tapered off after fall resulting in head injury (ETOH was involoved)   ANTI-EPILEPTIC MEDICATIONS: Lyrica (125mg  BID)   MUSCLE RELAXANTS:   ANTI-DEPRESSANTS: Cymbalta (60mg  AM and 30mg  PM)   ANTI-ANXIETY:   SLEEP:   NSAIDS/OTHER PAIN/PSYCHIATRIC/SEDATING/MOOD MEDICATIONS: Tylenol 2000-3000mg  per day.   OTHER TREATMENTS, INCLUDING COMPLEMENTARY AND ALTERNATIVE MEDICINE APPROACHES:  Diclofenac gel as needed, Meloxicam -helpful. Vitamin D, alphalipoic acid.  He feels that the most effective treatments are or have been  meds and shots..    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    Per  Dr. Lysle Rubens note 07/22/18:  "Procedureswith me.  LEFT L5 TFESI 03-23-18 with 80-85%relief for about 3-4 weeksof just axial back pain.  Bilateral L5TFESI 09-15-17 with 50-75% reliefof mixture of low back and leg pain.  RIGHT L5 TFESI 10-17-18with 100% relief ofRadicularsymptoms for about 3 months    Procedures performed byPain Colleagues (Dr. Amada Jupiter)  Per her note 10-21-16  Injections in our clinic have included (exluding MBB and trigger point injections):  08/12/16 - L5-S1 ILESI  07/01/16 - Ultrasound guided right trochanteric bursa injection   05/20/16 - Bilateral  shoulder injections  05/13/16 - Right L3 L4 L5/S1 RFA  02/19/16 - Left L3, L4, L5/S1 RFA  12/27/12 - L5-S1 IL ESI  08/31/12 - Left L5 TFESI  03/30/12 - Left L4/5, L5/S1 facet inj  02/10/12 - Left C6 C7 MB RFA  07/03/11 - Left L3, L4, L5/S1 RFA  05/12/11 - Left C4, C5, C6 RFA"    PHYSICAL EXAMINATION:  BP 123/87   Pulse 89   Ht 6\' 3"  (1.905 m) Comment: pt reported  Wt (!) 237 lb 6.4 oz (107.7 kg)   SpO2 97%   BMI 29.67 kg/m    Constitutional: He is Constitutional: well-appearing, well-groomed, in no distress, obese and cooperative and exhibits no pain behavior  Skin, exposed parts: well healed surgical incision on R great toe over dorsum, mild erythema  Eyes: Negative for miosis bilaterally. Pupils appropriate for room lighting.  Neurologic: Reduced sensation of lateral ankle/foot, intact over medial foot, to light touch. Moderate atrophy noted to R intrinsic foot muscles, especially abductor hallicus compared to left. 5/5 bilateral ADF, APF, and EHL. 5- R hip flexor, otherwise 5/5 L hip flexor and bilateral knee extension/flexion.  Musculoskeletal: Very limited lumbar extension, rotation bilaterally, moderately reduced flexion. Positive facet loading bilaterally. TTP lumbar paraspinalss with increased tone. Most tender over L5-S1 midline.  Psychiatric: Judgement/insight are normal. Mood is noral. Affect is normal. Patient is oriented to person, place and time    IMPRESSION:   Barre Acors is a 68 year old male with   1. Axial low back pain concerning for facet arthropathy at L5-S1 with prior positive response to RFA when including  L4-L5 as well.  2. R leg/foot pain laterally in setting of L malleolus fracture, but also with intrinsic foot atrophy, concerning for mechanical pain vs radiculopathy.  3. Prior treatment failures including: None  4. Medical comorbidities significant for multiple pain syndromes (neck pain, shoulder pain previously, groin/hip pain, prior greater trochanteric pain syndrome, R foot  fracture, R lateral malleolus fracture).    PLAN:    My impressions and treatment recommendations were discussed in detail with the patient who had no further questions before leaving the office today.    -EMG/NCS RLE to evaluate R foot and lateral leg pain/numbness in setting of some intrinsic foot atrophy. Depending on results of this, could consider R L5/S1 TESI vs caudal given some bilateral symptoms. We had a lengthy discussion regarding our desire to minimize total steroids used in the setting wanting to provide the minimal procedures necessary to improve his physical activity and quality of life.  -Discussed physical activity (has gym membership, pool) and dietary changes (has a nutritionist).  -Provided anti-inflammatory diet.   -Limit your tylenol/meloxicam as you are able as these can impact your liver, kidney, and blood pressure.    Constance Goltz, MD  Pain Medicine Fellow  [facility] [place]

## 2018-08-18 NOTE — Telephone Encounter (Signed)
I called and spoke to the pt and explained to try the tessalon perles that helps suppress the cough but may not completely resolve it.     There aren't any medications to eliminate the cough but the following supportive care: teaspoon honey to soothe the throat or drinking warm tea with honey, using a humidifier at bedside, increased water intake to stay hydrated.    Pt understood fully what I was saying.    Nothing further needed    Closing encounter

## 2018-08-25 ENCOUNTER — Ambulatory Visit: Payer: PPO | Attending: Anesthesiology | Admitting: Anesthesiology

## 2018-08-25 VITALS — BP 123/87 | HR 89 | Ht 75.0 in | Wt 237.4 lb

## 2018-08-25 DIAGNOSIS — R2689 Other abnormalities of gait and mobility: Secondary | ICD-10-CM | POA: Insufficient documentation

## 2018-08-25 DIAGNOSIS — M542 Cervicalgia: Secondary | ICD-10-CM

## 2018-08-25 DIAGNOSIS — Z789 Other specified health status: Secondary | ICD-10-CM

## 2018-08-25 DIAGNOSIS — M25571 Pain in right ankle and joints of right foot: Secondary | ICD-10-CM | POA: Insufficient documentation

## 2018-08-25 DIAGNOSIS — M545 Low back pain, unspecified: Secondary | ICD-10-CM

## 2018-08-25 DIAGNOSIS — Z7409 Other reduced mobility: Secondary | ICD-10-CM | POA: Insufficient documentation

## 2018-08-25 DIAGNOSIS — G8929 Other chronic pain: Secondary | ICD-10-CM | POA: Insufficient documentation

## 2018-08-25 DIAGNOSIS — M62571 Muscle wasting and atrophy, not elsewhere classified, right ankle and foot: Secondary | ICD-10-CM | POA: Insufficient documentation

## 2018-08-25 DIAGNOSIS — M5416 Radiculopathy, lumbar region: Secondary | ICD-10-CM

## 2018-08-25 DIAGNOSIS — Z6829 Body mass index (BMI) 29.0-29.9, adult: Secondary | ICD-10-CM

## 2018-08-25 NOTE — Patient Instructions (Addendum)
Thank you for your visit today.    1. Work hard on your diet and exercise, we strongly recommend more exercise via the pool to improve the pain associated with aerobic activity.  2. We have placed a referral for an EMG of the right leg. Based on this we can discuss the next steps in your care.  3. Limit your tylenol/meloxicam as you are able as these can impact your liver, kidney, and blood pressure.    Anti-inflammatory eating patterns have the ability to reduce inflammation and reduce pain research shows:  https://www.pubfacts.com/detail/30294938/A-systematic-review-and-meta-analysis-of-nutrition-interventions-for-chronic-noncancer-pain  Journal of Human Nutrition Diet 2019 Apr 7;32(2):198-225. Epub 2018 Oct 7.    [facility] Anti-inflammatory lifestyle eating handout   https://www.fammed.PromotionalReview.nl.pdf  (read 5 mins day, quick guide)    Other sources:   -Good book, local author on topic: The [address], used by headache patiens at [facility]  -Herbert Seta Tick MD website, recipes, strategies: EternalVitamin.dk, an Integrative pain specialist [place] [facility]    -Arthritis Foundation:  BodyEditor.com.cy.php  -Low inflammation eating food pyramids for most cultures http://dominguez.com/ RECIPES FOR MANY CULTURES      Follow up in 2 months, AFTER EMG is completed.

## 2018-08-25 NOTE — Progress Notes (Signed)
Review of Systems   Constitutional: Positive for fatigue and fever.   HENT: Positive for congestion, postnasal drip, sneezing and sore throat.    Eyes: Negative.    Respiratory: Positive for cough.    Cardiovascular: Negative.    Gastrointestinal: Positive for blood in stool, diarrhea and rectal pain.   Endocrine: Negative.    Genitourinary: Positive for enuresis and urgency.   Musculoskeletal: Positive for arthralgias, back pain, gait problem, myalgias, neck pain and neck stiffness.   Skin: Negative.    Allergic/Immunologic: Negative.    Neurological: Positive for weakness and numbness.   Hematological: Negative.    Psychiatric/Behavioral: Negative.

## 2018-08-25 NOTE — Progress Notes (Signed)
I saw and evaluated the patient with Dr. Jamesetta SoGoldish, who conducted the initial history.  I reviewed and confirmed the history in detail with [name] in person.   I was present for the examination and formulation portions of the encounter. I agree with the findings and the plan of care as documented in our notes. I did not edit the trainee's note.      Melven SartoriusKatherin Letisia Schwalb, MD  Assistant Professor  [facility]

## 2018-09-05 ENCOUNTER — Ambulatory Visit: Payer: PPO | Attending: Anesthesiology | Admitting: Physical Medicine & Rehabilitation

## 2018-09-05 DIAGNOSIS — G629 Polyneuropathy, unspecified: Secondary | ICD-10-CM

## 2018-09-06 ENCOUNTER — Ambulatory Visit (INDEPENDENT_AMBULATORY_CARE_PROVIDER_SITE_OTHER): Payer: PPO | Admitting: Podiatrist

## 2018-09-06 ENCOUNTER — Ambulatory Visit: Payer: PPO | Attending: Physical Medicine & Rehabilitation | Admitting: Registered Nurse

## 2018-09-06 ENCOUNTER — Encounter (HOSPITAL_BASED_OUTPATIENT_CLINIC_OR_DEPARTMENT_OTHER): Payer: Self-pay | Admitting: Registered Nurse

## 2018-09-06 VITALS — BP 121/82 | HR 92 | Ht 75.0 in | Wt 225.0 lb

## 2018-09-06 DIAGNOSIS — M2031 Hallux varus (acquired), right foot: Secondary | ICD-10-CM

## 2018-09-06 DIAGNOSIS — Z6828 Body mass index (BMI) 28.0-28.9, adult: Secondary | ICD-10-CM

## 2018-09-06 DIAGNOSIS — N3941 Urge incontinence: Secondary | ICD-10-CM | POA: Insufficient documentation

## 2018-09-06 DIAGNOSIS — R32 Unspecified urinary incontinence: Secondary | ICD-10-CM

## 2018-09-06 DIAGNOSIS — M2021 Hallux rigidus, right foot: Secondary | ICD-10-CM

## 2018-09-06 DIAGNOSIS — G629 Polyneuropathy, unspecified: Secondary | ICD-10-CM | POA: Insufficient documentation

## 2018-09-06 DIAGNOSIS — M7671 Peroneal tendinitis, right leg: Secondary | ICD-10-CM

## 2018-09-06 DIAGNOSIS — M2580 Other specified joint disorders, unspecified joint: Secondary | ICD-10-CM

## 2018-09-06 HISTORY — DX: Unspecified urinary incontinence: R32

## 2018-09-06 LAB — PR U/A NONAUTO DIPSTICK ONLY, ONSITE
Bilirubin (Indirect): NEGATIVE
Glucose, Urine: NEGATIVE mg/dL
Ketones, URN: NEGATIVE mg/dL
Leukocytes: NEGATIVE
Nitrite, URN: NEGATIVE
Occult Blood, URN: NEGATIVE
Urobilinogen, URN: 0.2 E.U./dL (ref 0.2–1.0)
pH, URN (UWNC): 6 (ref 5.0–8.0)

## 2018-09-06 LAB — PR MEAS POST-VOIDING RESIDUAL URINE&/BLADDER CAP: Volume, URN: 32

## 2018-09-06 MED ORDER — SOLIFENACIN SUCCINATE 5 MG OR TABS
5.0000 mg | ORAL_TABLET | Freq: Every day | ORAL | 11 refills | Status: DC
Start: 2018-09-06 — End: 2019-07-21

## 2018-09-06 NOTE — Progress Notes (Signed)
Patient: Ronnie Mason   Patient DOB: November 29, 1950     DOS:  09/06/2018     Accompanied by:  spouse    Subjective:  Ronnie Mason is a 68 year old year old malewho presents today for re-evaluation. Patient's C/C - current problem isrightsignificanthallux rigidus with mallet toe and plantarflexed 1st metatarsal with sesamoiditis. The patient states that the current conditionis 10% better, injection wore off on big toe. The current condition has existedsince March 2019.Patient describes the symptoms asworsening, his leg is buckling. Patient deniesnew injury. The condition is worse whennothing that the patient is aware of. Past treatments/studies includeorthotics, injection therapy. The patient statesno improvement. Patient's limitation islimited in walking.    He also c/o lateral ankle pain s/p ankle 01/13/2018.  It is not getting better.  Prior x-ray was negative for fracture.  It is not getting better.  Prior treatment is ice, ankle brace, rest, high top boot.  He states of ankle instability.      Goals:evaluate current condition    PCP:  Ronnie Medal, MD     PMH:   Past Medical History:   Diagnosis Date   . Carotid Sinus Hypersensitivity    . Disc disorder of lumbosacral region    . Hip injury    . HYPERTENSION     . HYPOTENSION     . Intraparenchymal hemorrhage of brain (HCC) 2017   . Pain of right hip joint 01/23/2016   . 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 Syncope    . 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(HCC)    . URI (upper respiratory infection)         Review of patient's allergies indicates:  Allergies   Allergen Reactions   . Adhesives Rash   . Bee Venom Hives, Itching and Swelling   . Gabapentin      Flu like symptoms, diarrhea, body ache upset stomach        Medications:   Outpatient Medications Prior to Visit   Medication Sig Dispense Refill   . Acetaminophen 500 MG Oral Tab 2 tablets bid     . albuterol HFA 108 (90 Base) MCG/ACT inhaler  Inhale 2 puffs by mouth every 4 hours as needed for shortness of breath/wheezing. 1 Inhaler 2   . Alpha-Lipoic Acid 300 MG tablet Take 1 tablet by mouth daily.     [name] amLODIPine 5 MG tablet Take 1 tablet (5 mg) by mouth 2 times a day. 90 tablet 1   . atorvastatin 10 MG Oral Tablet Take 1 tablet (10 mg) by mouth daily. 90 tablet 2   . BENFOTIAMINE OR Take 250 mg by mouth daily.     . benzonatate 200 MG capsule Take 1 capsule (200 mg) by mouth at bedtime as needed for cough. (Patient not taking: Reported on 08/25/2018) 30 capsule 0   . Cholecalciferol (VITAMIN D3) 2000 units Oral Cap Take 1 capsule (2,000 Units) by mouth daily. For low level 1 capsule 1   . Cyanocobalamin 1000 MCG Oral Tab one per day over-the-counter started 5/21 /2012 this level borderline low 1 Tab 1   . diclofenac 1 % gel      . DULoxetine 30 MG DR capsule Take 1 capsule (30 mg) by mouth daily. 90 capsule 1   . DULoxetine 60 MG DR capsule Take 1 capsule (60 mg) by mouth daily. 90 capsule 1   . EPINEPHrine 0.3 MG/0.3ML Injection Solution Auto-injector Inject as instructed per patient package  insert, 0.3 mg intramuscularly or subcutaneously into the thigh, if needed to treat anaphylaxis 3 each 1   . Lidocaine 5 % External Patch Apply 1 patch onto the skin daily. Apply to painful area for up to 12 hours in a 24 hour period. 30 patch 10   . lisinopril 20 MG tablet Take 1 tablet (20 mg) by mouth every 12 hours. 180 tablet 3   . meloxicam 7.5 MG tablet Take 1 tablet (7.5 mg) by mouth every morning. 30 tablet 3   . pregabalin 100 MG Oral Cap Take 1 capsule (100 mg) by mouth 2 times a day. 180 capsule 1   . pregabalin 50 MG Oral Cap 1 po bid (Patient not taking: Reported on 09/06/2018) 180 capsule 1   . primidone 50 MG tablet Take 1 tablet (50 mg) by mouth 2 times a day. 180 tablet 1   . solifenacin 5 MG tablet Take 1 tablet (5 mg) by mouth daily. 30 tablet 11   . thiamine 100 MG tablet Take 1 tablet by mouth daily.      [name] triamcinolone 0.1 % External Cream  As needed to right ear bid x 1 week 1 Tube 0     No facility-administered medications prior to visit.         PSH:   Past Surgical History:   Procedure Laterality Date   . ANES; COLONOSCOPY  2002    repeat in 3 years   . ANES; COLONOSCOPY & POLYPECTOMY  11/18/2007    repeat in 3 years   . HALLUX RIGIDUS W/CHEILECTOMY 1ST MP JT W/O IMPLT Right 10/21/2017    Dr. Donn Pierini   . L meniciscus Left 07/2017    by Dr Marcelle Overlie , re did left menisicus    . UNLISTED PROCEDURE FEMUR/KNEE     . UNLISTED PROCEDURE HANDS/FINGERS     . UNLISTED PROCEDURE SPINE  2013    rfa to c3 to c 7         Family History:  family history includes Colon Cancer in his father; Heart (other) in his mother; Other Family Hx in an other family member.    Social History:  Social History     Socioeconomic History   . Marital status: Married     Spouse name: Not on file   . Number of children: Not on file   . Years of education: Not on file   . Highest education level: Not on file   Occupational History   . Not on file   Social Needs   . Financial resource strain: Not on file   . Food insecurity:     Worry: Not on file     Inability: Not on file   . Transportation needs:     Medical: Not on file     Non-medical: Not on file   Tobacco Use   . Smoking status: Never Smoker   . Smokeless tobacco: Former Estate agent and Sexual Activity   . Alcohol use: Yes   . Drug use: No   . Sexual activity: Not on file   Lifestyle   . Physical activity:     Days per week: Not on file     Minutes per session: Not on file   . Stress: Not on file   Relationships   . Social connections:     Talks on phone: Not on file     Gets together: Not on file     Attends  religious service: Not on file     Active member of club or organization: Not on file     Attends meetings of clubs or organizations: Not on file     Relationship status: Not on file   . Intimate partner violence:     Fear of current or ex partner: Not on file     Emotionally abused: Not on file     Physically  abused: Not on file     Forced sexual activity: Not on file   Other Topics Concern   . 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  Non-smoker, 14 or more drinks/week almost exclusively wine with dinner.               Physical Exam:   There were no vitals filed for this visit.     General: The patient is White, Well developed, appearing stated age and in no acute distress      Vascular:   RIGHT:  Dorsalis pedis pulse is palpable.  Posterior tibial pulse is palpable. Capillary filling time note to be immediate  on the distal hallux.      Neurologic:  RIGHT:  The sensation is intact on the foot/ankle.  Motor coordination is intact with normal muscle tone.  There is no manifestations of pathological reflexes noted.      Dermatological:   RIGHT:  Upon inspection, the skin note to have normal texture/tone/turgor.  There is no ulceration/open wound or dermatosis present.  Integument coloration is within normal range and temperature is within normal range.  The hair growth is normal.      Musculoskeletal:   RIGHT:  There isseverepain on palpation on dorsal 1st MTPJ and at fibular sesamoid. Thereisswelling noted over the affect region. There is nowarmth to touch and ecchymosisof the affected area. There is severelimitation of ROM on 1st MTPJ with hallux mallet toe deformity and dorsal contracture of 1st MTPJ.  There is moderated pain on palpation of the.peroneal tendons on the along the peroneal tubercle region of calcaneus and peroneal tendons posterior to lateral malleolus region  There is mild swelling along the tendon.  There is no weakness in the tendon of the affected area.       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  We discuss possible etiology such as trauma.  Following treatment options were recommended - continue with current care, cast boot, cortisone injection, diagnostic MRI exam, physical therapy and surgery.  The patient elected to try -  diagnostic MRI exam.      Detailed education and discussion took place with the patient regards to hallux rigidus/limitus - Grade 4 with hallus mallet toe, right.   Discussion included spectrum of treatments available for this condition both conservative i.e. no treatment, orthotic and surgery.   After the discussion, the patient elected cast boot immobilization.          Order:  MRI without contrast on right ankle.    Return appointment: Return in about 4 weeks (around 10/04/2018), or if symptoms worsen or fail to improve.    TIME SPENT:  25 minutes was spent face-to-face with patient, greater than 50% of which was  spent counseling and coordinating care. They were given an opportunity to ask questions which were answered in a detailed manner.    Randa Spikeony D.H. Anika Shore DPM, FACFAS  Reconstructive Ankle and Foot Surgeon

## 2018-09-06 NOTE — Progress Notes (Signed)
FOLLOW UP SOAP NOTE    Patient: Ronnie Mason   Patient DOB: 09/05/1950     DOS:  07/05/2018     Accompaniedby:  patient was unaccompanied    Subjective:  Ronnie Mason is a 68 year old year old malewho presents today for re-evaluation. Patient's C/C - current problem isright significanthallux rigidus with mallet toe and plantarflexed 1st metatarsal with sesamoiditis. The patient states that the current conditionis 10% better, injection wore off on big toe. The current condition has existedsince March 2019.Patient describes the symptoms asworsening, his leg is buckling.  Patient deniesnew injury. The condition is worse whennothing that the patient is aware of. Past treatments/studies includeorthotics, injection therapy. The patient statesno improvement. Patient's limitation islimited in walking.      Goals: evaluate current condition

## 2018-09-06 NOTE — Progress Notes (Signed)
[facility] NEW PATIENT VISIT    ID/CC:    Chief Complaint   Patient presents with   . Urine Problem     Urinary frequency, occasional incontinence       History of Present Illness:   Ronnie Mason is a 68 year old male who presents to me for an approximately one years history of urinary frequency, urgency, and urge incontinence.  Is experiencing urge incontinence about 1-2 times a week.  He also experiences enuresis about once a week or so without urge.  (AUA ss today 10/4 with mostly irritative symptoms).      Pt has peripheral neuropathy in feet bilaterally, unclear cause.      Review of Systems:   ROS:   Constitutional: Negative    Eyes: Negative    Ears, Nose, Mouth, Throat: Negative    Cardiovascular: Negative    Respiratory: Negative    Gastrointestinal: Negative   Genitourinary: per above   Musculoskeletal: Negative    Skin: Negative    Neurological: per above    Psychiatric: Negative    Endocrine: Negative    Hematologic/Lymphatic: Negative   Allergic/Immunologic: Negative      Past Medical Hx:    Past Medical History:   Diagnosis Date   . Carotid Sinus Hypersensitivity    . Disc disorder of lumbosacral region    . Hip injury    . HYPERTENSION     . HYPOTENSION     . Intraparenchymal hemorrhage of brain (HCC) 2017   . Pain of right hip joint 01/23/2016   . 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 Syncope    . 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(HCC)    . URI (upper respiratory infection)      Patient Active Problem List   Diagnosis   . Syncope   . Hypotension   . Carotid Sinus Hypersensitivity   . URI (upper respiratory infection)   . Cervicalgia   . Hyperlipidemia   . Chronic pain   . Bee sting allergy   . Numbness and tingling of leg   . Chronic back pain   . B12 deficiency   . Chronic renal insufficiency, stage III (moderate) (HCC)   . Back pain, lumbosacral   . Lumbar radiculopathy, chronic   . Neck pain   . Fall   . Tremor   . Sensory neuropathy   . Spondylosis of  cervical region without myelopathy or radiculopathy   . Idiopathic peripheral neuropathy   . 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)   . History of alcohol dependence (HCC)   . Cerebral ventriculomegaly   . Cerebellar hypoplasia (HCC)   . Essential hypertension   . Essential tremor   . Hx of spontan intraparenchymal intracran bleed assoc with hypertension   . Alcohol abuse   . Intraparenchymal hemorrhage of brain (HCC)   . 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 Balance problem   . Chronic pain of both shoulders   . Vitamin D deficiency   . Difficulty in walking, not elsewhere classified   . Impaired mobility and ADLs   . 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(HCC)   . Primary osteoarthritis of right hip   . Mild vascular neurocognitive disorder (HCC)   . Right lumbar radiculitis   . Greater trochanteric pain syndrome of right lower extremity   . Complex tear of medial meniscus of left knee as current injury   .  Primary osteoarthritis of left knee   . Acute left-sided low back pain without sciatica   . Chronic left-sided low back pain without sciatica       Past Surgical Hx:    Past Surgical History:   Procedure Laterality Date   . ANES; COLONOSCOPY  2002    repeat in 3 years   . ANES; COLONOSCOPY & POLYPECTOMY  11/18/2007    repeat in 3 years   . HALLUX RIGIDUS W/CHEILECTOMY 1ST MP JT W/O IMPLT Right 10/21/2017    Dr. Donn PieriniBrian McInnes   . L meniciscus Left 07/2017    by Dr Marcelle OverlieHolland , re did left menisicus    . UNLISTED PROCEDURE FEMUR/KNEE     . UNLISTED PROCEDURE HANDS/FINGERS     . UNLISTED PROCEDURE SPINE  2013    rfa to c3 to c 7        Family and Social Hx:    Ronnie Mason , family history includes Colon Cancer in his father; Heart (other) in his mother; Other Family Hx in an other family member.[name]. He  reports that he has never smoked. He has quit using smokeless tobacco. He reports current alcohol use. He reports that he does not use drugs..  Social  History     Patient does not qualify to have social determinant information on file (likely too young).   Social History Narrative    ** Merged History Encounter **            Active Meds:    Outpatient Medications Marked as Taking for the 09/06/18 encounter (Office Visit) with Reggie Pileothman, Indria Bishara, ARNP   Medication Sig Dispense Refill   . Acetaminophen 500 MG Oral Tab 2 tablets bid     . albuterol HFA 108 (90 Base) MCG/ACT inhaler Inhale 2 puffs by mouth every 4 hours as needed for shortness of breath/wheezing. 1 Inhaler 2   . Alpha-Lipoic Acid 300 MG tablet Take 1 tablet by mouth daily.     [name]. amLODIPine 5 MG tablet Take 1 tablet (5 mg) by mouth 2 times a day. 90 tablet 1   . atorvastatin 10 MG Oral Tablet Take 1 tablet (10 mg) by mouth daily. 90 tablet 2   . BENFOTIAMINE OR Take 250 mg by mouth daily.     . Cholecalciferol (VITAMIN D3) 2000 units Oral Cap Take 1 capsule (2,000 Units) by mouth daily. For low level 1 capsule 1   . Cyanocobalamin 1000 MCG Oral Tab one per day over-the-counter started 5/21 /2012 this level borderline low 1 Tab 1   . diclofenac 1 % gel      . DULoxetine 30 MG DR capsule Take 1 capsule (30 mg) by mouth daily. 90 capsule 1   . DULoxetine 60 MG DR capsule Take 1 capsule (60 mg) by mouth daily. 90 capsule 1   . EPINEPHrine 0.3 MG/0.3ML Injection Solution Auto-injector Inject as instructed per patient package insert, 0.3 mg intramuscularly or subcutaneously into the thigh, if needed to treat anaphylaxis 3 each 1   . Lidocaine 5 % External Patch Apply 1 patch onto the skin daily. Apply to painful area for up to 12 hours in a 24 hour period. 30 patch 10   . lisinopril 20 MG tablet Take 1 tablet (20 mg) by mouth every 12 hours. 180 tablet 3   . meloxicam 7.5 MG tablet Take 1 tablet (7.5 mg) by mouth every morning. 30 tablet 3   . pregabalin 100 MG Oral Cap Take 1 capsule (  100 mg) by mouth 2 times a day. 180 capsule 1   . pregabalin 50 MG Oral Cap 1 po bid (Patient taking differently: 25 mg. 1 po bid)  180 capsule 1   . primidone 50 MG tablet Take 1 tablet (50 mg) by mouth 2 times a day. 180 tablet 1   . thiamine 100 MG tablet Take 1 tablet by mouth daily.      [name]. triamcinolone 0.1 % External Cream As needed to right ear bid x 1 week 1 Tube 0       Allergies:    Allergies as of 09/06/2018 - Reviewed 09/06/2018   Allergen Reaction Noted   . Adhesives Rash 05/26/2017   . Bee venom Hives, Itching, and Swelling 02/15/2013   . Gabapentin  05/11/2014         OBJECTIVE:  Physical Exam:    BP 121/82   Pulse 92   Ht 6\' 3"  (1.905 m)   Wt (!) 225 lb (102.1 kg)   SpO2 96%   BMI 28.12 kg/m   General: alert, no distress, healthy, well nourished  Skin: Skin color, texture, turgor normal. No rashes or concerning lesions.  Lymphatic: No inguinal lymphadenopathy.  Head: Normocephalic. No masses or lesions.  Eyes: Lids/periorbital skin normal, conjunctivae anicteric, EOMI.  Genitourinary: Circumcised penis with no lesions noticed. Orthotopic meatus that is patent. Testes descended bilaterally: Right testis 8 mL in volume with normal turgidity; Left testis 8 mL in volume with normal turgidity. Vasa are palpable.  No scrotal rash or lesions noticed, Epidimymis normal to palpation, No urethral discharge.  Grossly normal phallus.    Neurological: Alert and oriented x 3, muscle tone and strength normal, symmetric.    Pt had low urge to void, voided just under 50 ml, BVI of 32 ml.  Urine dip NEG today in office.     ASSESSMENT/PLAN:   Ronnie Mason is a 68 year old male with urge incontinence, overactivity.   Enuresis probably related to pregabalin, duloxetine use.    1)  Discussed significance of problem and its benign nature.   2)  Rx SOLIFENACIN 5 MG daily, warned of possible dry mouth and constipation.   3)  Avoid fluids for >2hr before bed time.  4)  Call with results, will plan care based on that.

## 2018-09-06 NOTE — Patient Instructions (Signed)
Walk with the boot for now

## 2018-09-06 NOTE — Addendum Note (Signed)
Addended by: Jon Billings on: 09/06/2018 02:14 PM     Modules accepted: Orders

## 2018-09-08 ENCOUNTER — Telehealth (HOSPITAL_BASED_OUTPATIENT_CLINIC_OR_DEPARTMENT_OTHER): Payer: Self-pay | Admitting: Anesthesiology

## 2018-09-08 NOTE — Telephone Encounter (Signed)
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 This is unchanged from previous EMG in Sept of 2018 (amplitudes of motor responses are smaller but still in normal range).     There is no electrodiagnostic evidence of a right lumbosacral motor radiculopathy."    Based on results would not recommend proceeding with caudal epidural. Recommend continued workup with podiatry.

## 2018-09-13 ENCOUNTER — Telehealth (INDEPENDENT_AMBULATORY_CARE_PROVIDER_SITE_OTHER): Payer: Self-pay | Admitting: Internal Medicine

## 2018-09-13 DIAGNOSIS — G609 Hereditary and idiopathic neuropathy, unspecified: Secondary | ICD-10-CM

## 2018-09-13 MED ORDER — PREGABALIN 100 MG OR CAPS
100.0000 mg | ORAL_CAPSULE | Freq: Two times a day (BID) | ORAL | 1 refills | Status: DC
Start: 2018-09-13 — End: 2019-03-16

## 2018-09-13 MED ORDER — PREGABALIN 50 MG OR CAPS
ORAL_CAPSULE | ORAL | 1 refills | Status: DC
Start: 2018-09-13 — End: 2019-03-16

## 2018-09-13 NOTE — Telephone Encounter (Signed)
Pended script from fax.

## 2018-09-13 NOTE — Telephone Encounter (Signed)
Done

## 2018-09-21 ENCOUNTER — Ambulatory Visit: Payer: PPO | Attending: Podiatrist

## 2018-09-21 DIAGNOSIS — M65871 Other synovitis and tenosynovitis, right ankle and foot: Secondary | ICD-10-CM | POA: Insufficient documentation

## 2018-09-21 DIAGNOSIS — S93491D Sprain of other ligament of right ankle, subsequent encounter: Secondary | ICD-10-CM

## 2018-09-21 DIAGNOSIS — M7671 Peroneal tendinitis, right leg: Secondary | ICD-10-CM | POA: Insufficient documentation

## 2018-10-04 ENCOUNTER — Ambulatory Visit (INDEPENDENT_AMBULATORY_CARE_PROVIDER_SITE_OTHER): Payer: PPO | Admitting: Podiatrist

## 2018-10-04 DIAGNOSIS — M2031 Hallux varus (acquired), right foot: Secondary | ICD-10-CM

## 2018-10-04 DIAGNOSIS — M2021 Hallux rigidus, right foot: Secondary | ICD-10-CM

## 2018-10-04 DIAGNOSIS — S86311A Strain of muscle(s) and tendon(s) of peroneal muscle group at lower leg level, right leg, initial encounter: Secondary | ICD-10-CM

## 2018-10-04 NOTE — Patient Instructions (Signed)
Recovery time and weight bearing status (protective weight bearing) were discussed with the patient.

## 2018-10-04 NOTE — Progress Notes (Signed)
Patient: Ronnie Mason   Patient DOB: 08-03-1951     DOS:  10/04/2018     Accompanied by:  spouse    Subjective:  Ronnie Mason is a 68 year old year old male who presents today for re-evaluation.  Patient's C/C - current problem is hallux rigidus with mallet toe and plantarflexed 1st metatarsal with sesamoiditis and pain along the peroneal tendon.  The patient states that the current condition is plateau.  The current condition has existed for few year(s) .Patient describes the symptoms as very painful under big toe joint on pad of foot.  Patient denies other syndoms.  The condition is worse when moving around, walking .  Past treatments/studies include orthotics, injections.  The patient states no improvement.  Patient's limitation is limited in walking.      Goals: evaluate current condition    PCP:  [name], MD     PMH:   Past Medical History:   Diagnosis Date   . Carotid Sinus Hypersensitivity    . Disc disorder of lumbosacral region    . Hip injury    . HYPERTENSION     . HYPOTENSION     . Intraparenchymal hemorrhage of brain (HCC) 2017   . Pain of right hip joint 01/23/2016   . 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 Syncope    . 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(HCC)    . URI (upper respiratory infection)         Review of patient's allergies indicates:  Allergies   Allergen Reactions   . Adhesives Rash   . Bee Venom Hives, Itching and Swelling   . Gabapentin      Flu like symptoms, diarrhea, body ache upset stomach        Medications:   Outpatient Medications Prior to Visit   Medication Sig Dispense Refill   . Acetaminophen 500 MG Oral Tab 2 tablets bid     . albuterol HFA 108 (90 Base) MCG/ACT inhaler Inhale 2 puffs by mouth every 4 hours as needed for shortness of breath/wheezing. 1 Inhaler 2   . Alpha-Lipoic Acid 300 MG tablet Take 1 tablet by mouth daily.     [name] amLODIPine 5 MG tablet Take 1 tablet (5 mg) by mouth 2 times a day. 90 tablet 1   .  atorvastatin 10 MG Oral Tablet Take 1 tablet (10 mg) by mouth daily. 90 tablet 2   . BENFOTIAMINE OR Take 250 mg by mouth daily.     . benzonatate 200 MG capsule Take 1 capsule (200 mg) by mouth at bedtime as needed for cough. (Patient not taking: Reported on 08/25/2018) 30 capsule 0   . Cholecalciferol (VITAMIN D3) 2000 units Oral Cap Take 1 capsule (2,000 Units) by mouth daily. For low level 1 capsule 1   . Cyanocobalamin 1000 MCG Oral Tab one per day over-the-counter started 5/21 /2012 this level borderline low 1 Tab 1   . diclofenac 1 % gel      . DULoxetine 30 MG DR capsule Take 1 capsule (30 mg) by mouth daily. 90 capsule 1   . DULoxetine 60 MG DR capsule Take 1 capsule (60 mg) by mouth daily. 90 capsule 1   . EPINEPHrine 0.3 MG/0.3ML Injection Solution Auto-injector Inject as instructed per patient package insert, 0.3 mg intramuscularly or subcutaneously into the thigh, if needed to treat anaphylaxis 3 each 1   . Lidocaine 5 % External Patch Apply 1 patch  onto the skin daily. Apply to painful area for up to 12 hours in a 24 hour period. 30 patch 10   . lisinopril 20 MG tablet Take 1 tablet (20 mg) by mouth every 12 hours. 180 tablet 3   . meloxicam 7.5 MG tablet Take 1 tablet (7.5 mg) by mouth every morning. 30 tablet 3   . pregabalin 100 MG capsule Take 1 capsule (100 mg) by mouth 2 times a day. 180 capsule 1   . pregabalin 50 MG capsule 1 po bid 180 capsule 1   . primidone 50 MG tablet Take 1 tablet (50 mg) by mouth 2 times a day. 180 tablet 1   . solifenacin 5 MG tablet Take 1 tablet (5 mg) by mouth daily. 30 tablet 11   . thiamine 100 MG tablet Take 1 tablet by mouth daily.      [name] triamcinolone 0.1 % External Cream As needed to right ear bid x 1 week 1 Tube 0     No facility-administered medications prior to visit.         PSH:   Past Surgical History:   Procedure Laterality Date   . ANES; COLONOSCOPY  2002    repeat in 3 years   . ANES; COLONOSCOPY & POLYPECTOMY  11/18/2007    repeat in 3 years   . HALLUX  RIGIDUS W/CHEILECTOMY 1ST MP JT W/O IMPLT Right 10/21/2017    Dr. Donn Pierini   . L meniciscus Left 07/2017    by Dr Marcelle Overlie , re did left menisicus    . UNLISTED PROCEDURE FEMUR/KNEE     . UNLISTED PROCEDURE HANDS/FINGERS     . UNLISTED PROCEDURE SPINE  2013    rfa to c3 to c 7         Family History:  family history includes Colon Cancer in his father; Heart (other) in his mother; Other Family Hx in an other family member.    Social History:  Social History     Socioeconomic History   . Marital status: Married     Spouse name: Not on file   . Number of children: Not on file   . Years of education: Not on file   . Highest education level: Not on file   Occupational History   . Not on file   Social Needs   . Financial resource strain: Not on file   . Food insecurity     Worry: Not on file     Inability: Not on file   . Transportation needs     Medical: Not on file     Non-medical: Not on file   Tobacco Use   . Smoking status: Never Smoker   . Smokeless tobacco: Former Estate agent and Sexual Activity   . Alcohol use: Yes   . Drug use: No   . Sexual activity: Not on file   Lifestyle   . Physical activity     Days per week: Not on file     Minutes per session: Not on file   . Stress: Not on file   Relationships   . Social Wellsite geologist on phone: Not on file     Gets together: Not on file     Attends religious service: Not on file     Active member of club or organization: Not on file     Attends meetings of clubs or organizations: Not on file     Relationship  status: Not on file   . Intimate partner violence     Fear of current or ex partner: Not on file     Emotionally abused: Not on file     Physically abused: Not on file     Forced sexual activity: Not on file   Other Topics Concern   . 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  Non-smoker, 14 or more drinks/week almost exclusively wine with dinner.                Physical Exam:   There were no vitals filed for this visit.     General: The patient is White, Well developed, appearing stated age and in no acute distress      Vascular:   RIGHT:  Dorsalis pedis pulse is palpable.  Posterior tibial pulse is palpable. Capillary filling time note to be immediate  on the distal hallux.      Neurologic:  RIGHT:  The sensation is intact on the foot/ankle.  Motor coordination is intact with normal muscle tone.  There is no manifestations of pathological reflexes noted.      Dermatological:   RIGHT:  Upon inspection, the skin note to have normal texture/tone/turgor.  There is no ulceration/open wound or dermatosis present.  Integument coloration is within normal range and temperature is within normal range.  The hair growth is normal.      Musculoskeletal:   RIGHT:  There is moderated pain on palpation of the.peroneal tendons inferior to lateral malleolus region and peroneal tendons posterior to lateral malleolus region  There is mild swelling along the tendon.  There is no weakness and nodule along the tendon in the tendon of the affected area.   There is moderatepain on palpationon dorsal 1st MTPJ and atfibular sesamoid. Thereisswelling noted over the affect region. There is nowarmth to touch and ecchymosisof the affected area.There is severelimitation of ROM on 1st MTPJ with hallux mallet toe deformity and dorsal contracture of 1st MTPJ.     Assessment:   (M20.21) Hallux rigidus, right foot  (primary encounter diagnosis)  (M20.31) Hallux malleus of right foot  (B14.782N) Tear of peroneal tendon, right, initial encounter    Plan:  Detailed education and discussion took place with the patient regards to hallux rigidus/limitus - Grade 4.   Discussion included spectrum of treatments available for this condition both conservative i.e. continue current care, cortisone injection and surgery - joint arthrodesis.   After the discussion, the patient elected surgery - joint  arthrodesis.   His wife will undergone surgery end of April and consider surgery after she has recovered.  Regards to sesamoiditis, once the mallet toe is address, I believe it will resolved.      The patient elected to schedule for surgery.  We'll plan for the following procedure(s):   1st MTPJ arthrodesis(28750), RIGHT    Recovery time and weight bearing status (protective weight bearing) were discussed with the patient.  Patient was given an opportunity to ask all questions.  Questions were answered to the best of my ability.  The patient was instructed to speak with our surgical coordinator and schedule surgery at [facility].  Approximate time needed for this surgery is 1.5 hours. The pre-operative H & P will be perform by [name].  Patient will schedule for pre-operative examination date.       Type of anesthesia:  Monitored anesthesia care; moderate to  deep anesthesia with IV sedation however final determination will be referred to anesthesiologist.      Instrumentation:  [employer] - Orthohelix.       Detailed education and discussion took place with the patient regards to peroneal brevis and longus tendon tear, right.  We discussed possible mechanism of the tear.   We explained that tendons can take time to heal. Following treatment options were recommended:  continue current care, ankle splint, high top boot, cortisone injection and surgery.  The patient elected to try -  no treatment.    Regards to surgery, it requires NWB for 6-8 wks.  He is not interested in surgery.     Return appointment: Return for pre op.    TIME SPENT:  15 minutes was spent face-to-face with patient, greater than 50% of which was spent counseling and coordinating care. They were given an opportunity to ask questions which were answered in a detailed manner.    [name] DPM, FACFAS  Reconstructive Ankle and Foot Surgeon

## 2018-10-04 NOTE — Progress Notes (Signed)
Accompanied by:  patient was unaccompanied and spouse    Ronnie Mason  is a 68 year old year old male who presents today for re-evaluation.    Patient's C/C - current problem is Hallux rigidus with mallet toe and plantarflexed 1st metatarsal with sesamoiditis.s  .    The patient states that the current condition is plateau.    The current condition has existed for few, year(s) .  Patient describes the symptoms as very painful under big toe joint on pad of foot.    Patient denies other syndoms.    The condition is worse when moving around, walking .    Past treatments/studies include orthotics, injections.    The patient states no improvement.    Patient's limitation is limited in walking.

## 2018-10-25 ENCOUNTER — Telehealth (HOSPITAL_BASED_OUTPATIENT_CLINIC_OR_DEPARTMENT_OTHER): Payer: Self-pay | Admitting: Anesthesiology

## 2018-10-25 NOTE — Telephone Encounter (Signed)
Hi, this is Dr. [name]'s office, [facility] April visits. Due to the impact that the coranavirus is having on our community and the healthcare system, at this time, we are postponing all in-clinic appointments indefinitely. Please call   [phone] , option 2 to cancel any March/April appointments, as we are not able to cancel without your knowledge. We do not know about May and beyond. If you would like a telephone visit, let them know when you call to cancel. Thank you for your understanding. Please be well. This is an unmonitored, non-response SMS number. Do not reply.

## 2018-10-25 NOTE — Telephone Encounter (Signed)
Left vm for pt. Need to change appt on 4/9 with Dr. Araceli Bouche to a phone visit or reschedule until after 12/02/18.

## 2018-10-26 ENCOUNTER — Other Ambulatory Visit (INDEPENDENT_AMBULATORY_CARE_PROVIDER_SITE_OTHER): Payer: Self-pay | Admitting: Internal Medicine

## 2018-10-26 ENCOUNTER — Telehealth (HOSPITAL_BASED_OUTPATIENT_CLINIC_OR_DEPARTMENT_OTHER): Payer: PPO | Admitting: Registered Nurse

## 2018-10-26 DIAGNOSIS — I10 Essential (primary) hypertension: Secondary | ICD-10-CM

## 2018-10-26 DIAGNOSIS — Z8679 Personal history of other diseases of the circulatory system: Secondary | ICD-10-CM

## 2018-10-26 MED ORDER — AMLODIPINE BESYLATE 5 MG OR TABS
5.0000 mg | ORAL_TABLET | Freq: Two times a day (BID) | ORAL | 1 refills | Status: DC
Start: 2018-10-26 — End: 2019-01-31

## 2018-10-26 NOTE — Telephone Encounter (Signed)
Called pt at time of planned phone consult. Left VOICEMAIL MESSAGE with my phone number asking pt call me back.

## 2018-10-26 NOTE — Telephone Encounter (Signed)
amLODIPine 5 MG tablet   Last visit: 07/25/2018  Last CPE: 09/17/2017  Next visit: Visit date not found  Last refill: 07/25/2018  Last Prescribed by: Judi Saa, MD

## 2018-11-10 ENCOUNTER — Ambulatory Visit (HOSPITAL_BASED_OUTPATIENT_CLINIC_OR_DEPARTMENT_OTHER): Payer: PPO | Admitting: Anesthesiology

## 2018-11-10 ENCOUNTER — Encounter (HOSPITAL_BASED_OUTPATIENT_CLINIC_OR_DEPARTMENT_OTHER): Payer: Self-pay

## 2018-11-10 NOTE — Progress Notes (Signed)
Called patient x 2. Went directly to [employer]. Left message to call to reschedule.

## 2018-11-14 ENCOUNTER — Other Ambulatory Visit (INDEPENDENT_AMBULATORY_CARE_PROVIDER_SITE_OTHER): Payer: Self-pay | Admitting: Internal Medicine

## 2018-11-14 DIAGNOSIS — H6061 Unspecified chronic otitis externa, right ear: Secondary | ICD-10-CM

## 2018-11-14 MED ORDER — TRIAMCINOLONE ACETONIDE 0.1 % EX CREA
TOPICAL_CREAM | CUTANEOUS | 0 refills | Status: DC
Start: 2018-11-14 — End: 2019-07-13

## 2018-11-14 NOTE — Telephone Encounter (Signed)
triamcinolone 0.1 % External Cream   Last visit: 07/25/2018  Last CPE: 09/17/17  Next visit: Visit date not found  Last refill: 05/02/2018  Last Prescribed by: Judi Saa, MD

## 2018-11-16 ENCOUNTER — Other Ambulatory Visit (HOSPITAL_BASED_OUTPATIENT_CLINIC_OR_DEPARTMENT_OTHER): Payer: Self-pay | Admitting: Physical Medicine & Rehabilitation

## 2018-11-16 DIAGNOSIS — M1611 Unilateral primary osteoarthritis, right hip: Secondary | ICD-10-CM

## 2018-11-16 MED ORDER — MELOXICAM 7.5 MG OR TABS
ORAL_TABLET | ORAL | 3 refills | Status: DC
Start: 2018-11-16 — End: 2019-08-01

## 2018-11-16 NOTE — Telephone Encounter (Signed)
Patient's LOV 07/22/2018 with Dr. Gustavus BryantLiem:     chronic pain syndrome, acute 1 month flare of underlying right greater than left hip osteoarthritis    Discussion/Plan:   His symptoms today seem different than when I saw him about a month ago.  He describes pain now more the groin and anterior thighs whereas previously he describes pain mainly in the left low back.  In reviewing his chart and imaging, he has some known right hip moderate osteoarthritis.  He has seen Dr. Madaline GuthrieFernando in consultation for hip replacement in August 2018 and was felt that they would hold off on any hip replacement.  He denies any recent traumatic event or any increase activities that would cause a flare of his hip symptoms.  We reviewed nonsurgical manager for this include physical therapy, medication management and injections.    Given the number of injections he has had previously to other parts of his body as listed above, I discussed my duration about jumping directly into the hip joint injection which she and his wife acknowledged understanding of.  I would prefer to start with a course of anti-inflammatories and see if this can calm down.  He was just recently on a 5 day course of oral prednisone for his back so would not do this again and instead with trial meloxicam 7.5 mg daily for the next 4 weeks.  Also encouraged him to use acetaminophen  Discussed that meloxicam can certainly can raise his hypertension    Certainly, if he has no improvements with meloxicam, a right fluoroscopically guided intra-articular hip injection could be considered.  But again, I would be very careful to attribute this as the best treatment option.     Also had a very direct conversation with Onalee HuaDavid about his long-term pain care.  I think that Theodoro GristDave has some difficulty remembering how exactly feels was certain symptoms.  With his low back pain that I saw him about 1 month ago, he had noted that his pain was "not that bad."  My recollection was quite different  in that he was complaining of worsening symptoms and that was the reason for why we put him on prednisone.  He later recall that the prednisone actually had been helping with his pain and that was the reason why he felt that his pain was not as bad.    I reviewed the recent assessment from Dr. 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  I think he would benefit the most from a comprehensive pain management approach, which would be best directed and coordinated by a pain physician.   I also think that any further injections for his LUMBAR SPINE  may be best centralized with one team and especially with a pain provider like Dr. [name].  Dr. [name] has suggested that he be seen by Dr. Araceli [facility]chePeperzak at [facility] for a comprehensive pain management approach.     _______________________      Patient saw Dr. Araceli BouchePeperzak at pain management clinic 08/25/2018:    The patient presents to today for interval reevaluation of chronic low back pain, specifically at L4-S1. The patient has a pain history significant for low back pain with radicular leg pain that has been managed by multiple providers in multiple clinics. The patient was last seen by Dr. Araceli BouchePeperzak on 10/16/15 at which time we recommended PT and physiatry referral. Since that time he has followed with Dr. Gustavus BryantLiem  and Dr. Amada Jupiter who both feel he has multiple pain complaints that may individually improve with intervention, but concern exists about the number of pain procedures he is receiving over the years that may be a smaller component of an overall central sensitization syndrome like fibromyalgia.    Has follow up appointment with Dr. Araceli Bouche 11/24/2018.    _______________________    Patient would like Rx refill of meloxicam.     Routing to Dr. [name] to advise. Will Dr. Araceli Bouche be providing care/refill for this patient?       Alda Lea, RN  [facility] at [facility]

## 2018-11-16 NOTE — Telephone Encounter (Signed)
meloxicam 7.5 MG tablet          Sig:  TAKE ONE TABLET BY MOUTH EVERY MORNING         Sent to pharmacy as  : Meloxicam 7.5 MG Oral Tablet         Class:  Normal         Order:  [id number]         E-Prescribing Status:  Receipt confirmed by pharmacy  (11/16/2018 12:01 PM PDT)           RX refilled.     Closing TE    Alda Lea, RN  [facility] at [facility]

## 2018-11-17 ENCOUNTER — Encounter (HOSPITAL_BASED_OUTPATIENT_CLINIC_OR_DEPARTMENT_OTHER): Payer: Self-pay

## 2018-11-22 NOTE — Progress Notes (Signed)
Review of Systems   Constitutional: Negative.    HENT: Negative.    Eyes: Negative.    Respiratory: Negative.    Cardiovascular: Negative.    Gastrointestinal: Negative.    Endocrine: Negative.    Genitourinary: Negative.    Musculoskeletal: Positive for arthralgias, back pain, gait problem, joint swelling and myalgias.   Skin: Negative.    Allergic/Immunologic: Negative.    Neurological: Positive for numbness.   Hematological: Negative.    Psychiatric/Behavioral: Negative.

## 2018-11-24 ENCOUNTER — Ambulatory Visit: Payer: PPO | Admitting: Anesthesiology

## 2018-11-24 DIAGNOSIS — G629 Polyneuropathy, unspecified: Secondary | ICD-10-CM

## 2018-11-24 MED ORDER — MEMANTINE HCL 5 MG OR TABS
5.0000 mg | ORAL_TABLET | Freq: Every day | ORAL | 2 refills | Status: DC
Start: 2018-11-24 — End: 2018-12-29

## 2018-11-24 NOTE — Progress Notes (Signed)
PHONE VISIT    Context of the visit  This encounter was provided by the [facility] via phone conferencing.  The provider, Renea Ee, MD, was located in private office in [place] accompanied by no one.  The patient was located at home and was accompanied by wife, per patient report. We attempted Telehealth visit via Zoom, but patient was unable to locate link easily.    CHIEF COMPLAINT:   Right foot/ankle pain    HISTORY OF PRESENT ILLNESS:  The patient is scheduled today for interval reevaluation of right leg pain. The patient has a pain history significant for lower back and right leg pain. The patient was last seen by myself on 08/25/18 at which time we recommended EMG/NCV (did not show radiculopathy), increased activity, and dietary changes. He has since seen a podiatrist who has offered surgery 1st MTPJ arthrodesis and diagnosed with peroneal brevis and longus tear. This has been deferred due to delay in elective procedures with COVID-19.    The patient continues to have worsening 10/10 pain in his right foot/ankle. He describes two distinct pains: his underlying MSK pain to be addressed by podiatry and his underlying neuropathy pain. He continues to take pregabalin and duloxetine at adequate doses but is not having sufficient relief. He is needing to use a cane all of the time now. When he has pain in his lower extremity it leads to pain in his pain. He also notes pain shooting up his extremity towards his back when he presses on the ball of his foot.    He is now taking meloxicam 7.5 mg daily and acetaminophen 1000mg  TID prn pain.    REVIEW OF SYSTEMS:  ROS documented by MA on 11/22/18 and reviewed.        IMPRESSION:   1. Axial low back pain concerning for facet arthropathy at L5-S1 with prior positive response to RFA when including  L4-L5 as well.  2. R leg/foot pain laterally in setting mallet toe and peroneal brevis and longus tear diagnosed by  podiatry-surgery declined  3. Prior treatment failures including: None  4. Medical comorbidities significant for multiple pain syndromes (neck pain, shoulder pain previously, groin/hip pain, prior greater trochanteric pain syndrome, R foot fracture, R lateral malleolus fracture).    PLAN:    -Follow up phone visit in 4 weeks  -Plan trial of memantine 5mg  PO daily for neuropathic pain. Will prescribe until steady dose/plan reached with plan for PCP to take over  -If memantine not effective or tolerated will consider dose increase versus trial of low dose naltrexone. Patient reports compounding pharmacy nearby  -Consider referral for lidocaine infusion in future  -Patient may want to consider temporary increase in meloxicam dose until surgery      Time attestation  I spent a total of 25 minutes of medical discussion with the patient.

## 2018-11-25 ENCOUNTER — Encounter (HOSPITAL_BASED_OUTPATIENT_CLINIC_OR_DEPARTMENT_OTHER): Payer: Self-pay

## 2018-11-30 ENCOUNTER — Telehealth (INDEPENDENT_AMBULATORY_CARE_PROVIDER_SITE_OTHER): Payer: Self-pay | Admitting: Internal Medicine

## 2018-11-30 DIAGNOSIS — G894 Chronic pain syndrome: Secondary | ICD-10-CM

## 2018-11-30 DIAGNOSIS — M545 Low back pain, unspecified: Secondary | ICD-10-CM

## 2018-11-30 MED ORDER — LIDOCAINE 5 % EX PTCH
1.0000 | MEDICATED_PATCH | Freq: Every day | CUTANEOUS | 0 refills | Status: DC
Start: 2018-11-30 — End: 2019-07-07

## 2018-11-30 NOTE — Telephone Encounter (Signed)
I pended script from fax.

## 2018-12-09 ENCOUNTER — Telehealth (INDEPENDENT_AMBULATORY_CARE_PROVIDER_SITE_OTHER): Payer: Self-pay

## 2018-12-09 NOTE — Telephone Encounter (Signed)
General Message:    Detailed Message: Pt requests a call to discuss scheduling surgery and if [name] is able to do elective surgeries now pls.  Return Call: General message okay

## 2018-12-12 ENCOUNTER — Other Ambulatory Visit (INDEPENDENT_AMBULATORY_CARE_PROVIDER_SITE_OTHER): Payer: Self-pay | Admitting: Podiatrist

## 2018-12-12 ENCOUNTER — Telehealth (INDEPENDENT_AMBULATORY_CARE_PROVIDER_SITE_OTHER): Payer: Self-pay | Admitting: Podiatrist

## 2018-12-12 DIAGNOSIS — Z01818 Encounter for other preprocedural examination: Secondary | ICD-10-CM

## 2018-12-12 NOTE — Telephone Encounter (Signed)
Pt called and states that 01/12/19 will work well for him.

## 2018-12-12 NOTE — Telephone Encounter (Signed)
L/m for pt to call me back to discuss surgery options

## 2018-12-12 NOTE — Telephone Encounter (Signed)
Talked with pt and everything is scheduled

## 2018-12-12 NOTE — Telephone Encounter (Signed)
General Message:     Detailed Message: Pt returned a call from our office and requests another call back pls.  Return Call: General message okay

## 2018-12-13 ENCOUNTER — Telehealth (INDEPENDENT_AMBULATORY_CARE_PROVIDER_SITE_OTHER): Payer: Self-pay | Admitting: Podiatrist

## 2018-12-13 NOTE — Telephone Encounter (Signed)
Physician performing procedure:  [name]    Surgery Scheduling Logistics:   Procedure: R FOOT 1 MTPJ ARTHRODESIS  Date: 01/12/2019  Location of Procedure: [facility]  Case status:  outpatient  Diagnosis/ICD10: M20.21, M20.31, S86.311A  Primary CPT: 28750  Additional CPT(s) including possible procedures     Insurance:  Insurance Company: [employer] Telephone Number:   Group Number:     Statistician ID:   Insurance Rep:     Pre Authorization Needed: N   PA Number:   Authorization Status:    Reference #:   Berkley Harvey Received Date/Time:   Auth Received Via:   If auth received via website insert Screenshot here:   Fax, or Letter scanned to media:  /Date    Referral from PCP Needed:    Second Opinion Needed:    PER WEBSITE NO PA REQUIRED

## 2018-12-15 ENCOUNTER — Other Ambulatory Visit (INDEPENDENT_AMBULATORY_CARE_PROVIDER_SITE_OTHER): Payer: Self-pay | Admitting: Internal Medicine

## 2018-12-15 DIAGNOSIS — G250 Essential tremor: Secondary | ICD-10-CM

## 2018-12-15 MED ORDER — PRIMIDONE 50 MG OR TABS
50.0000 mg | ORAL_TABLET | Freq: Two times a day (BID) | ORAL | 1 refills | Status: DC
Start: 2018-12-15 — End: 2019-06-12

## 2018-12-15 NOTE — Telephone Encounter (Signed)
primidone 50 MG tablet  Last visit: 07/25/2018  Last CPE: 09/17/17  Next visit: Visit date not found  Last refill: 07/06/18  Last Prescribed by: Judi Saa, MD

## 2018-12-29 ENCOUNTER — Ambulatory Visit: Payer: PPO | Attending: Anesthesiology | Admitting: Anesthesiology

## 2018-12-29 ENCOUNTER — Encounter (HOSPITAL_BASED_OUTPATIENT_CLINIC_OR_DEPARTMENT_OTHER): Payer: Self-pay

## 2018-12-29 DIAGNOSIS — M792 Neuralgia and neuritis, unspecified: Secondary | ICD-10-CM | POA: Insufficient documentation

## 2018-12-29 DIAGNOSIS — M7918 Myalgia, other site: Secondary | ICD-10-CM | POA: Insufficient documentation

## 2018-12-29 DIAGNOSIS — G629 Polyneuropathy, unspecified: Secondary | ICD-10-CM | POA: Insufficient documentation

## 2018-12-29 DIAGNOSIS — G894 Chronic pain syndrome: Secondary | ICD-10-CM | POA: Insufficient documentation

## 2018-12-29 DIAGNOSIS — M79604 Pain in right leg: Secondary | ICD-10-CM | POA: Insufficient documentation

## 2018-12-29 DIAGNOSIS — M47816 Spondylosis without myelopathy or radiculopathy, lumbar region: Secondary | ICD-10-CM | POA: Insufficient documentation

## 2018-12-29 MED ORDER — MEMANTINE HCL 10 MG OR TABS
10.0000 mg | ORAL_TABLET | Freq: Every day | ORAL | 0 refills | Status: DC
Start: 2018-12-29 — End: 2019-02-08

## 2018-12-29 NOTE — Progress Notes (Signed)
Ronnie Mason;  MRN: [id number];   DOB: [birth year]    TELE-HEALTH VISIT    Context of the visit  This encounter was provided by the [facility] via video conferencing.  The provider, Renea Ee, MD, was located in private office at home.  The patient was located at home with his wife.    I have explained to the patient that:  . This method of communication has been selected in the context of the current COVID-19 global health crisis, to minimize travel and interpersonal interactions and preserve the health of patients, providers and our community;  . There are different risks with this method, including concerns regarding confidentiality, which we have worked to create a secure environment for our communication,  and the inability to perform a physical examination;  . The encounter is not being recorded and that permission is not granted for recording.    The patient expressed understanding and agreed.    CHIEF COMPLAINT: Right foot and ankle pain    HISTORY OF PRESENT ILLNESS:  The patient presents to today for interval reevaluation of right leg pain. The patient has a pain history significant for lower back and right leg pain. I last spoke with Ronnie Mason on the phone 11/24/18 at with time we discussed his upcoming arthrodesis and his underlying neuropathic pain. We discussed a trial of memantine (prescribed by me) with future options including low dose naltrexone and/or lidocaine infusion.    The patient is now scheduled for R foot 1 MTP arthrodesis on 01/12/19.    The pain is located right lower leg. It is getting more severe and he is looking forward to surgery. His medications such a tylenol, lyrica, and duloxetine help some but it is not sufficient. He fells limited in what he can do in PT. Walking and activity make the pain worse.The patient also feel worsening back pain across the lower back. He feels there is a large muscular component. He continues to complain  of neuropathy in the legs as well.    Other than that as stated above patient denies any changes in past medical history, medications, or allergies since we last spoke.    REVIEW OF SYSTEMS:  Endorses easy bruising. Endorses poor balance. Remaining review of systems negative.    PHYSICAL EXAMINATION:  Constitutional: He is Constitutional: well-appearing, in no distress and well-nourished and exhibits no pain behavior  Skin, exposed parts: color normal  Eyes: Negative for miosis bilaterally. Pupils appropriate for room lighting. Extraocular movements are intact bilaterally  Musculoskeletal:Moving all 4 extremities  Psychiatric: Judgement/insight are good. Mood is good. Affect is normal. Patient is oriented to person, place time    IMPRESSION:   Ronnie Mason is a 68 year old male with   1.Axial low back pain concerning for facet arthropathy at L5-S1 with prior positive response to RFA when including L4-L5 as well.  2. R leg/foot pain laterally in setting mallet toe and peroneal brevis and longus tear diagnosed by podiatry- R foot 1 MTP arthrodesis palnned on 01/12/19.  3. Prior treatment failures including: None  4. Medical comorbidities significant formultiple pain syndromes (neck pain, shoulder pain previously, groin/hip pain, prior greater trochanteric pain syndrome, R foot fracture, R lateral malleolus fracture).    PLAN:    My impressions and treatment recommendations were discussed in detail with the patient who had no further questions before leaving the office today.     Recommend continuing pregabalin 150mg  BID, duloxetine  90 mg daily, meloxicam 7.5 mg daily, acetaminophen 1000 mg TID prn pain.    Increase memantine to 10 mg daily. I will continue to prescribe until steady dose.    Consider celecoxib as alternative to meloxicam if bruising continues. Patient should discuss this with Dr. Gustavus BryantLiem..    Proceed with surgery and continue with PT.    Will reevaluate patient in 6-8 weeks after surgery to  determine candidacy for procedure (trigger point, versus MBB, versus caudal). Will consider dose increase of memantine at that time.     Future treatment options continue to include LDN or lidocaine infusion.

## 2019-01-02 NOTE — Patient Instructions (Signed)
PREOPERATIVE DISCUSSION:    Planned procedure(s):    1st MTPJ arthrodesis(28750), RIGHT    The purpose of these procedure(s): address the hallux rigidus/limitus - Grade 4 which will alleviate the symptom complex.   Patient understands and rationale for the surgery.      Alternative treatments: live with current condition    Detailed pre & post-operative instructions and recommendation to given.  The weight bearing status will be: partial heel weight bearing with cast boot using following modality to assist ambulation: cane and crutches.   Patient was instructed not to drive if operated foot is driving foot.   Patient was educated about elevating and icing the surgical foot to control pain and swelling.  And patient was instructed to keep the dressing dry and not to remove the dressing.  Preoperative Hibiclens bathing instructions and sponge was given.      Rx dispensed: Oxycodone 5 mg.   Patient was instructed not to drive while on opioid until affect was realize.      DME: A cast boot    Anesthesia: Monitored anesthesia care; moderate to deep anesthesia with IV sedation however final determination will be referred to anesthesiologist.  Patient position:  supine      POSSIBLE RISKS INHERENT TO ANY SURGERY:  . Infection  . development of a DVT(blood clot) or phlebitis  . Excessive bleeding - hematoma formation and swelling  . painful scar tissue formation  . Numbness and nerve entrapment  . limited motion  . delayed-union , non-union and/or mal-union  . reaction to implanted biomaterials  . over-correction, under-correction with recurrence of the deformities  . CRPS (severe nerve pain)  . continued pain  . possibility that future surgery may need to be performed  . Amputation/death  . General reaction to anesthesia/death  . No guarantee given.     Pre-op medical clearance:  PCP.    Pre-operative risk factors:   1.  DVT/PE (Blood clot) risk factors are minimal.  The preventive measure for DVT are regular leg/ankle  exercises with compression sock was ordered and early mobilization with/without weight bearing and none - patient is ambulating.    2. Prophylaxis for infection:  4% CHG E-Z scrub surgical scrub bottle was dispensed prepping the surgical extremity where patient was instructed scrub the extremity night before and morning of.   Additionally, the surgical extremity will be scrub at the hospital twice at [facility] and in OR room just before surgery.  Patient will also receive pre-operative IV antibiotic and possibly 2nd dose of IV antibiotic if needed.      3.  Change in medication regimen before surgery:  Discontinue NSAIDS/ASA and supplements 7 days prior to surgery.     PLEASE CALL WITH ANY QUESTIONS:  [phone]

## 2019-01-02 NOTE — Progress Notes (Signed)
PRE-OPERATIVE HISTORY AND PHYSCIAL EXAMINATION    Patient: Ronnie Mason   Patient DOB: [birth year]     DOS:  01/02/2019     Accompanied by:  spouse    Chief Complaint: [name] is a 68 year old year old male who presents today for pre-operative visit with complaint(s) of painful left big toe joint and painful lateral ankle.     History of present Illness:     Planned date of surgery:  01/12/19  The patient states that the condition has existed for many, year(s)  and began gradually.  The location of the condition is Right Hallux, plantar joint area.  The precipitating event was none.  Patient describes the symptoms as painful still with activity.  Patient denies fever, chills, calf pain.  The quality of pain aching, burning and cramping.  The level the pain moderate.  The condition is is not getting better.  The affected area is made worse by walking, uneven ground.  Past treatments/studies include inserts, rest, ice, injections and nothing.  The patient states no improvement with treatment.  Previous diagnostic test/evaluation:  seen by podiatrist and x-ray.  Patient's limitation is activities are limited in walking, limited in recreation and limits activities of daily living.      He also states lateral/peroneal tendon tear which bother's him.  However the repair requires NWB for 6 weeks.  He decline surgery.     Goals: walk without pain and exercise without pain.      History of MRSA:  Negative    Covid test within 72 hours: Yes    Prior anesthesia reaction:  none    PCP:  Ronnie Medal, MD     PMH:   Past Medical History:   Diagnosis Date   . Carotid Sinus Hypersensitivity    . Disc disorder of lumbosacral region    . Hip injury    . HYPERTENSION     . HYPOTENSION     . Intraparenchymal hemorrhage of brain (HCC) 2017   . Pain of right hip joint 01/23/2016   . 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 Syncope    . 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(HCC)    . URI  (upper respiratory infection)         Review of patient's allergies indicates:  Allergies   Allergen Reactions   . Adhesives Rash   . Bee Venom Hives, Itching and Swelling   . Gabapentin      Flu like symptoms, diarrhea, body ache upset stomach        Medications:   Outpatient Medications Prior to Visit   Medication Sig Dispense Refill   . Acetaminophen 500 MG Oral Tab 2 tablets bid     . albuterol HFA 108 (90 Base) MCG/ACT inhaler Inhale 2 puffs by mouth every 4 hours as needed for shortness of breath/wheezing. (Patient not taking: Reported on 11/22/2018) 1 Inhaler 2   . Alpha-Lipoic Acid 300 MG tablet Take 1 tablet by mouth daily.     [name] amLODIPine 5 MG tablet Take 1 tablet (5 mg) by mouth 2 times a day. 90 tablet 1   . atorvastatin 10 MG Oral Tablet Take 1 tablet (10 mg) by mouth daily. 90 tablet 2   . BENFOTIAMINE OR Take 250 mg by mouth daily.     . benzonatate 200 MG capsule Take 1 capsule (200 mg) by mouth at bedtime as needed for cough. (Patient not taking: Reported  on 08/25/2018) 30 capsule 0   . Cholecalciferol (VITAMIN D3) 2000 units Oral Cap Take 1 capsule (2,000 Units) by mouth daily. For low level 1 capsule 1   . Cyanocobalamin 1000 MCG Oral Tab one per day over-the-counter started 5/21 /2012 this level borderline low 1 Tab 1   . diclofenac 1 % gel      . DULoxetine 30 MG DR capsule Take 1 capsule (30 mg) by mouth daily. 90 capsule 1   . DULoxetine 60 MG DR capsule Take 1 capsule (60 mg) by mouth daily. 90 capsule 1   . EPINEPHrine 0.3 MG/0.3ML Injection Solution Auto-injector Inject as instructed per patient package insert, 0.3 mg intramuscularly or subcutaneously into the thigh, if needed to treat anaphylaxis 3 each 1   . Lidocaine 5 % External Patch Apply 1 patch onto the skin daily. Apply to painful area for up to 12 hours in a 24 hour period. 30 patch 10   . lidocaine 5 % patch Apply 1 patch onto the skin daily. Apply to painful area for up to 12 hours in a 24 hour period. 30 patch 0   . lisinopril  20 MG tablet Take 1 tablet (20 mg) by mouth every 12 hours. 180 tablet 3   . meloxicam 7.5 MG tablet TAKE ONE TABLET BY MOUTH EVERY MORNING 60 tablet 3   . memantine 10 MG tablet Take 1 tablet (10 mg) by mouth daily. 30 tablet 0   . pregabalin 100 MG capsule Take 1 capsule (100 mg) by mouth 2 times a day. 180 capsule 1   . pregabalin 50 MG capsule 1 po bid 180 capsule 1   . primidone 50 MG tablet Take 1 tablet (50 mg) by mouth 2 times a day. 180 tablet 1   . solifenacin 5 MG tablet Take 1 tablet (5 mg) by mouth daily. 30 tablet 11   . thiamine 100 MG tablet Take 1 tablet by mouth daily.      [name] triamcinolone 0.1 % cream As needed to right ear bid x 1 week 1 Tube 0     No facility-administered medications prior to visit.         PSH:   Past Surgical History:   Procedure Laterality Date   . ANES; COLONOSCOPY  2002    repeat in 3 years   . ANES; COLONOSCOPY & POLYPECTOMY  11/18/2007    repeat in 3 years   . HALLUX RIGIDUS W/CHEILECTOMY 1ST MP JT W/O IMPLT Right 10/21/2017    Dr. Donn Pierini   . L meniciscus Left 07/2017    by Dr Marcelle Overlie , re did left menisicus    . UNLISTED PROCEDURE FEMUR/KNEE     . UNLISTED PROCEDURE HANDS/FINGERS     . UNLISTED PROCEDURE SPINE  2013    rfa to c3 to c 7         Family History:  family history includes Colon Cancer in his father; Heart (other) in his mother; Other Family Hx in an other family member.    Social History:  Social History     Socioeconomic History   . Marital status: Married     Spouse name: Not on file   . Number of children: Not on file   . Years of education: Not on file   . Highest education level: Not on file   Occupational History   . Not on file   Social Needs   . Financial resource strain: Not on file   .  Food insecurity     Worry: Not on file     Inability: Not on file   . Transportation needs     Medical: Not on file     Non-medical: Not on file   Tobacco Use   . Smoking status: Never Smoker   . Smokeless tobacco: Former Estate agentUser   Substance and Sexual Activity   .  Alcohol use: Yes   . Drug use: No   . Sexual activity: Not on file   Lifestyle   . Physical activity     Days per week: Not on file     Minutes per session: Not on file   . Stress: Not on file   Relationships   . Social Wellsite geologistconnections     Talks on phone: Not on file     Gets together: Not on file     Attends religious service: Not on file     Active member of club or organization: Not on file     Attends meetings of clubs or organizations: Not on file     Relationship status: Not on file   . Intimate partner violence     Fear of current or ex partner: Not on file     Emotionally abused: Not on file     Physically abused: Not on file     Forced sexual activity: Not on file   Other Topics Concern   . 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  Non-smoker, 14 or more drinks/week almost exclusively wine with dinner.               Review of Systems   Constitutional: Negative    Eyes: Deferred   Ears, Nose, Mouth, Throat: Deferred   Cardiovascular: Negative    Respiratory: Negative    Gastrointestinal: Negative   Genitourinary: Deferred   Musculoskeletal: As noted in HPI above   Skin: Negative    Neurological: Negative    Psychiatric: Deferred   Endocrine: Negative    Hematologic/Lymphatic: Negative   Allergic/Immunologic: Deferred     Physical Exam:   There were no vitals filed for this visit.     General: The patient is White Well developed, appearing stated age and in no acute distress  Pleasant, well-developed, well-nourished. Alert and oriented to person, place, and time.  No apparent distress.    Head/face:    normocephalic, atraumatic, no lesions    Eyes:  PEERL/EOMI    Ears, Nose, Mouth and Throat:   Hearing:  Normal as tested by [finger rub, tuning fork, whispered voice].  , Nose:  normal nasal mucosa, septum and turbinates, Mouth:  Lips, gums, tongue and oral mucosa are normal.  Teeth appear healthy., Pharynx:  oropharynx shows  normal tonsils and adenoids without post-nasal drainage. supple, no thyromegaly    Neck and Thyroid:  normal,supple,nontender to palpation,no adenopathy     Heart:  regular rate and rhythm, no murmurs, rubs or gallops    Respiratory:   clear to ausculation nl resp effort    Abdomen:  soft, normoactive bowel sounds and nontender, obese    LOWER EXTREMITIES EXAM:  Vascular:   RIGHT:  Dorsalis pedis pulse is palpable.  Posterior tibial pulse is palpable. Capillary filling time note to be immediate  on the distal hallux.      Neurologic:  RIGHT:  The sensation is intact on the foot/ankle.  Motor coordination is intact  with normal muscle tone.  There is no manifestations of pathological reflexes noted.      Dermatological:   RIGHT:   Upon inspection, the skin note to have normal texture/tone/turgor.  There is no ulceration/open wound or dermatosis present.  Integument coloration is within normal range and temperature is within normal range.  The hair growth is normal.      Musculoskeletal:   RIGHT:   There is moderatepain on palpationon dorsal 1st MTPJ and atfibular sesamoid. Thereisswelling noted over the affect region. There is nowarmth to touch and ecchymosisof the affected area.There is severelimitation of ROM on 1st MTPJ with hallux mallet toe deformity and dorsal contracture of 1st MTPJ.    Assessment:   (M20.21) Hallux rigidus, right foot  (primary encounter diagnosis)    Planned procedure(s):    1st MTPJ arthrodesis(28750), RIGHT    The purpose of these procedure(s): address the hallux rigidus/limitus - Grade 4 which will alleviate the symptom complex.   Patient understands and rationale for the surgery.      Alternative treatments: live with current condition    Detailed pre & post-operative instructions and recommendation to given.  The weight bearing status will be: partial heel weight bearing with cast boot using following modality to assist ambulation: cane and crutches.   Patient was  instructed not to drive if operated foot is driving foot.   Patient was educated about elevating and icing the surgical foot to control pain and swelling.  And patient was instructed to keep the dressing dry and not to remove the dressing.  Preoperative Hibiclens bathing instructions and sponge was given.      Rx dispensed: Oxycodone 5 mg.   Patient was instructed not to drive while on opioid until affect was realize.      DME: A cast boot     Cast boot/camboot dispensed:  A cast boot/camboot was fitted and dispensed by Diane MA.  Due to the patient's diagnosis and related symptoms this is medically necessary for treatment.  The function of this device is to restrict and limit motion, provide stabilization, and immobilization to the affected area.  The goals and function of this device was explained in detail to the patient.  The device appeared to be fitting well and the patient states that the device is comfortable at this time.  At the time the device was dispensed, it was suitable for the condition and was not substandard.  The boot is under warranty and precautions were reviewed - if ambulating contralateral leg should have a lift to accommodate limb length discrepancy to help avoid hip/back pain.  Prescription/Rx for dispensing one boot made today, diagnosis code on chart note today, the patients current condition needs protective weight bearing status, [facility] paperwork reviewed and dispensed.  If patient is ambulating with the boot, limb length discrepancy was informed and recommended wearing appropriate lift and raised shoes on contralateral leg.       Anesthesia: Monitored anesthesia care; moderate to deep anesthesia with IV sedation however final determination will be referred to anesthesiologist.  Patient position:  supine    Consent:  The patient has been advised of the approximate disability involved for these procedures.  In addition, the patient has been advised as to the alternatives of care,  including continued conservative care as well as surgical procedures.  The patient understands that if surgical procedures are performed, there are risks and complications that could occur, including but not limited to: hematoma formation, development of a DVT or  phlebitis, infection, painful scar tissue formation, limited motion, delayed-union, non-union, mal-union, reaction to implanted biomaterials, over-correction, under-correction with recurrence of the deformities, CRPS, continued pain, and the possibility that future surgery may need to be performed.  The patient was given the opportunity to ask questions which were answered to the best of my ability.  The patient voiced no concerns, will consider all these options, and schedule accordingly.  No guarantee given.     Pre-op medical clearance:  PCP.    Pre-operative risk factors:   1.  DVT risk factors are minimal.  The preventive measure for DVT are regular leg/ankle exercises with compression sock was ordered and early mobilization with/without weight bearing and none - patient is ambulating.      2.  Prophylaxis for cardiac events with perioperative beta-blockers:  No.    3. Prophylaxis for infection:  4% CHG E-Z scrub surgical scrub bottle was dispensed prepping the surgical extremity where patient was instructed scrub the extremity night before and morning of.   Additionally, the surgical extremity will be scrub at the hospital twice at [facility] and in OR room just before surgery.  Patient will also receive pre-operative IV antibiotic and possibly 2nd dose of IV antibiotic if needed.      4.  Change in medication regimen before surgery:  Discontinue NSAIDS/ASA and supplements 7 days prior to surgery.      5.  Metabolic/bone healing risk factors: not indicated.  Lab. work up: none.     Return appointment: Return in about 10 days (around 01/13/2019) for post op, x-ray AP/LAT/MO foot.    TIME SPENT:  25 minutes was spent face-to-face with patient, greater than 50%  of which was spent counseling and coordinating care. They were given an opportunity to ask questions which were answered in a detailed manner.         [name] DPM, FACFAS  Reconstructive Ankle and Foot Surgeon  [facility]

## 2019-01-03 ENCOUNTER — Ambulatory Visit (INDEPENDENT_AMBULATORY_CARE_PROVIDER_SITE_OTHER): Payer: PPO | Admitting: Podiatrist

## 2019-01-03 ENCOUNTER — Encounter (INDEPENDENT_AMBULATORY_CARE_PROVIDER_SITE_OTHER): Payer: PPO | Admitting: Podiatrist

## 2019-01-03 VITALS — BP 129/87 | HR 93 | Temp 98.8°F

## 2019-01-03 DIAGNOSIS — M2031 Hallux varus (acquired), right foot: Secondary | ICD-10-CM

## 2019-01-03 DIAGNOSIS — M2021 Hallux rigidus, right foot: Secondary | ICD-10-CM

## 2019-01-03 DIAGNOSIS — M1611 Unilateral primary osteoarthritis, right hip: Secondary | ICD-10-CM

## 2019-01-03 MED ORDER — OXYCODONE HCL 5 MG OR TABS
5.0000 mg | ORAL_TABLET | Freq: Four times a day (QID) | ORAL | 0 refills | Status: DC | PRN
Start: 2019-01-03 — End: 2019-08-01

## 2019-01-03 NOTE — Progress Notes (Signed)
Accompanied by:  wife    Planned date of surgery:  01/12/19  The patient states that the condition has existed for many, year(s)  and began gradually.    The location of the condition is Right Hallux, plantar joint area.    The precipitating event was none.    Patient describes the symptoms as painful still with activity.    Patient denies fever, chills, calf pain.    The quality of pain aching, burning and cramping   .    The level the pain moderate.    The condition is is not getting better.    The affected area is made worse by walking, uneven ground.    Past treatments/studies include inserts, rest, ice, injections and nothing .    The patient states no improvement with treatment.    Previous diagnostic test/evaluation:  seen by podiatrist and x-ray.    Patient's limitation is activities are limited in walking, limited in recreation and limits activities of daily living.      Goals: walk without pain and exercise without pain.

## 2019-01-04 ENCOUNTER — Encounter (INDEPENDENT_AMBULATORY_CARE_PROVIDER_SITE_OTHER): Payer: PPO | Admitting: Podiatrist

## 2019-01-05 ENCOUNTER — Ambulatory Visit: Payer: PPO

## 2019-01-10 ENCOUNTER — Ambulatory Visit: Payer: PPO | Attending: Family Medicine

## 2019-01-10 ENCOUNTER — Encounter (INDEPENDENT_AMBULATORY_CARE_PROVIDER_SITE_OTHER): Payer: PPO

## 2019-01-10 DIAGNOSIS — Z1159 Encounter for screening for other viral diseases: Secondary | ICD-10-CM

## 2019-01-10 DIAGNOSIS — Z119 Encounter for screening for infectious and parasitic diseases, unspecified: Secondary | ICD-10-CM

## 2019-01-10 DIAGNOSIS — Z01812 Encounter for preprocedural laboratory examination: Secondary | ICD-10-CM | POA: Insufficient documentation

## 2019-01-10 LAB — COVID-19 CORONAVIRUS QUALITATIVE PCR: COVID-19 Coronavirus Qual PCR Result: NOT DETECTED

## 2019-01-11 NOTE — Result Encounter Note (Signed)
This test was performed as part of pre-operative or pre-procedural COVID-19 screening. The result will be managed by the primary team.

## 2019-01-12 ENCOUNTER — Ambulatory Visit
Admission: RE | Admit: 2019-01-12 | Discharge: 2019-01-12 | Disposition: A | Discharge status: Home / Self Care | Payer: PPO | Attending: Podiatrist | Admitting: Podiatrist

## 2019-01-12 ENCOUNTER — Ambulatory Visit (HOSPITAL_COMMUNITY): Payer: Self-pay | Admitting: Podiatrist

## 2019-01-12 DIAGNOSIS — S86311A Strain of muscle(s) and tendon(s) of peroneal muscle group at lower leg level, right leg, initial encounter: Secondary | ICD-10-CM

## 2019-01-12 DIAGNOSIS — M19071 Primary osteoarthritis, right ankle and foot: Secondary | ICD-10-CM

## 2019-01-12 DIAGNOSIS — M2021 Hallux rigidus, right foot: Secondary | ICD-10-CM | POA: Insufficient documentation

## 2019-01-12 DIAGNOSIS — M13871 Other specified arthritis, right ankle and foot: Secondary | ICD-10-CM | POA: Insufficient documentation

## 2019-01-12 HISTORY — PX: SURGICAL HX OTHER: 99

## 2019-01-13 ENCOUNTER — Ambulatory Visit (INDEPENDENT_AMBULATORY_CARE_PROVIDER_SITE_OTHER): Payer: PPO | Admitting: Podiatrist

## 2019-01-13 ENCOUNTER — Encounter (INDEPENDENT_AMBULATORY_CARE_PROVIDER_SITE_OTHER): Payer: Self-pay

## 2019-01-13 VITALS — BP 128/79 | HR 89 | Temp 97.9°F

## 2019-01-13 DIAGNOSIS — M2021 Hallux rigidus, right foot: Secondary | ICD-10-CM

## 2019-01-13 DIAGNOSIS — M1611 Unilateral primary osteoarthritis, right hip: Secondary | ICD-10-CM

## 2019-01-13 NOTE — Progress Notes (Signed)
Post-Surgery Follow-Up SOAP Note    Patient: Ronnie GranaDavid Paul Mason   Patient DOB: 11/06/1950     DOS:  01/13/2019     Accompanied by:  spouse    Subjective:    Procedure:  1st MTPJ Arthrodesis  RIGHT  PO week #:  1 Day    Ronnie GranaDavid Paul Mason  is a 68 year old year old male return today for unscheduled visit due to fresh blood on the dressing.  This is 1st post operative visit.    The patient states excessive bleeding since home, all thru night, still bright red and wet. Patient relates bleeding through dressings.  The patient states of mild pain.  The patient is compliant with post-operative instructions.  Currently instructed post-op weightbearing status is partial weight bearing, uses cane .  The patient denies of and fever/chill, SOB/CP and calf pain.  Patient denies of side effects of the prescribed medication.  Current post-op pain medication is oxycodone 5mg .    The pain is well controlled.    DVT prophylaxis:  81 Aspirin bid.    PMH:   Past Medical History:   Diagnosis Date   . Carotid Sinus Hypersensitivity    . Disc disorder of lumbosacral region    . Hip injury    . HYPERTENSION     . HYPOTENSION     . Intraparenchymal hemorrhage of brain (HCC) 2017   . Pain of right hip joint 01/23/2016   . 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 Syncope    . 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(HCC)    . URI (upper respiratory infection)         Review of patient's allergies indicates:  Allergies   Allergen Reactions   . Adhesives Rash   . Bee Venom Hives, Itching and Swelling   . Gabapentin      Flu like symptoms, diarrhea, body ache upset stomach        Medications:   Outpatient Medications Prior to Visit   Medication Sig Dispense Refill   . Acetaminophen 500 MG Oral Tab 2 tablets bid     . albuterol HFA 108 (90 Base) MCG/ACT inhaler Inhale 2 puffs by mouth every 4 hours as needed for shortness of breath/wheezing. (Patient not taking: Reported on 01/13/2019) 1 Inhaler 2   . Alpha-Lipoic Acid 300  MG tablet Take 1 tablet by mouth daily.     [name]. amLODIPine 5 MG tablet Take 1 tablet (5 mg) by mouth 2 times a day. 90 tablet 1   . atorvastatin 10 MG Oral Tablet Take 1 tablet (10 mg) by mouth daily. 90 tablet 2   . BENFOTIAMINE OR Take 250 mg by mouth daily.     . benzonatate 200 MG capsule Take 1 capsule (200 mg) by mouth at bedtime as needed for cough. (Patient not taking: Reported on 08/25/2018) 30 capsule 0   . Cholecalciferol (VITAMIN D3) 2000 units Oral Cap Take 1 capsule (2,000 Units) by mouth daily. For low level 1 capsule 1   . Cyanocobalamin 1000 MCG Oral Tab one per day over-the-counter started 5/21 /2012 this level borderline low 1 Tab 1   . diclofenac 1 % gel      . DULoxetine 30 MG DR capsule Take 1 capsule (30 mg) by mouth daily. 90 capsule 1   . DULoxetine 60 MG DR capsule Take 1 capsule (60 mg) by mouth daily. 90 capsule 1   . EPINEPHrine 0.3 MG/0.3ML Injection Solution Auto-injector Inject  as instructed per patient package insert, 0.3 mg intramuscularly or subcutaneously into the thigh, if needed to treat anaphylaxis 3 each 1   . Lidocaine 5 % External Patch Apply 1 patch onto the skin daily. Apply to painful area for up to 12 hours in a 24 hour period. 30 patch 10   . lidocaine 5 % patch Apply 1 patch onto the skin daily. Apply to painful area for up to 12 hours in a 24 hour period. 30 patch 0   . lisinopril 20 MG tablet Take 1 tablet (20 mg) by mouth every 12 hours. 180 tablet 3   . meloxicam 7.5 MG tablet TAKE ONE TABLET BY MOUTH EVERY MORNING 60 tablet 3   . memantine 10 MG tablet Take 1 tablet (10 mg) by mouth daily. 30 tablet 0   . oxyCODONE 5 MG tablet Take 1 tablet (5 mg) by mouth every 6 hours as needed for severe pain. 30 tablet 0   . pregabalin 100 MG capsule Take 1 capsule (100 mg) by mouth 2 times a day. 180 capsule 1   . pregabalin 50 MG capsule 1 po bid 180 capsule 1   . primidone 50 MG tablet Take 1 tablet (50 mg) by mouth 2 times a day. 180 tablet 1   . solifenacin 5 MG tablet Take  1 tablet (5 mg) by mouth daily. 30 tablet 11   . thiamine 100 MG tablet Take 1 tablet by mouth daily.      [name]. triamcinolone 0.1 % cream As needed to right ear bid x 1 week 1 Tube 0     No facility-administered medications prior to visit.         Objective:   Vitals:    01/13/19 1050   BP: 128/79   BP Cuff Size: Regular   BP Site: Right Arm   BP Position: Sitting   Pulse: 89   Temp: 97.9 F (36.6 C)        The dressings are wet from fresh blood.  The surgical sites were examined and the incision(s) has/have well approximate, no dehiscence and no erythema.  There is mild pain on surgical site.  There is mild edema and is no erythema noted.  There is no signs of DVT i.e. no calf pain, swelling and redness.  Clinically, the post-operative alignments is good.   There is no evidence of allodynia.  Signs of hyperalgesia is absent.  There is no motor inhibition. The skin color is Normal for ethnicity.  The skin temperature is Warm.          Assessment:   (M20.21) Hallux rigidus, right foot  (primary encounter diagnosis)  (M16.11) Primary osteoarthritis of right hip     The patient is making steady progress post-operatively. The patient appears to be NON COMPLIANT regarding post-operative instruction - he may be on his foot too much.    Plan:      A discussion took place regarding post-operative progress.  Patient was instructed to call our office if they notice any significant change is post-operative progress and experience severe pain, swelling, fever/chill, calf pain, SOB.   Precaution were stressed today regards to level of activity/elevation/icing.   He was instructed minimize his activity.  Following dressing was applied:  Dressing entails: steri strip and Dressing entails: new sterile dressings: 4 x 4, Kerlix, and ace bandage  Surgical finding:  intraoperative findings were reviewed.      Ambulatory status:  The patient was instructed to partial  heel weight bearing using camboot/cast boot and cane.        DVT  prophylaxis:  none - patient is ambulating.      Patient allowed activities: every one hour of elevation (8 - 10") of the surgical foot, foot can be down for 5 minutes to address swelling and pain.       Pain management:  The patient was instructed to use rest, ice, elevation, compression and NSAID as primary care for pain and swelling.  At this point we agree to use:  oxycodone.      Return/Follow-up:  Return for As scheduled.    Vira Browns DPM, FACFAS  Reconstructive Ankle and Foot Surgeon  [facility]

## 2019-01-13 NOTE — Progress Notes (Signed)
Correction of computer generated error.

## 2019-01-13 NOTE — Progress Notes (Signed)
Accompanied by:  spouse    Procedure:  1st MTPJ Arthrodesis  RIGHT  PO week #:  1 Day    Ronnie Mason  is a 68 year old year old male return today for unscheduled visit.    This is 1st post operative visit.    The patient states excessive bleeding since home, all thru night, still bright red and wet.   Patient relates bleeding through dressings.       The patient states of mild pain.    The patient is compliant with post-operative instructions.    Currently instructed post-op weightbearing status is partial weight bearing, uses cane .    The patient denies of and fever/chill, SOB/CP and calf pain.    Patient denies of side effects of the prescribed medication.    Current post-op pain medication is oxycodone 5mg .    The pain is well controlled.    DVT prophylaxis:  81 Aspirin bid.

## 2019-01-13 NOTE — Patient Instructions (Signed)
Ambulatory status:  The patient was instructed to partial heel weight bearing using camboot/cast boot and cane.        Patient allowed activities: every one hour of elevation (8 - 10") of the surgical foot, foot can be down for 5 minutes to address swelling and pain.

## 2019-01-13 NOTE — Progress Notes (Signed)
Pt called, bled thru the night after surgery, says its still bright red and wet, so we are just going to have him come in for [place] to redress, he will come in a few hours

## 2019-01-16 ENCOUNTER — Encounter (INDEPENDENT_AMBULATORY_CARE_PROVIDER_SITE_OTHER): Payer: Self-pay | Admitting: Podiatrist

## 2019-01-17 ENCOUNTER — Encounter (INDEPENDENT_AMBULATORY_CARE_PROVIDER_SITE_OTHER): Payer: Self-pay

## 2019-01-17 ENCOUNTER — Other Ambulatory Visit (INDEPENDENT_AMBULATORY_CARE_PROVIDER_SITE_OTHER): Payer: Self-pay | Admitting: Podiatrist

## 2019-01-17 DIAGNOSIS — M2021 Hallux rigidus, right foot: Secondary | ICD-10-CM

## 2019-01-17 DIAGNOSIS — S86311A Strain of muscle(s) and tendon(s) of peroneal muscle group at lower leg level, right leg, initial encounter: Secondary | ICD-10-CM

## 2019-01-17 NOTE — Addendum Note (Signed)
Addended by: Sampson Goon on: 01/17/2019 04:58 PM     Modules accepted: Orders

## 2019-01-17 NOTE — Telephone Encounter (Signed)
Patient called in stating that his pharmacy has not received the order for Augmentin.    I pended order to Dr. Maudie Mercury. Prescription was called to patient's pharmacy.  Sampson Goon, CMA

## 2019-01-18 MED ORDER — AMOXICILLIN-POT CLAVULANATE 500-125 MG OR TABS
500.0000 mg | ORAL_TABLET | Freq: Three times a day (TID) | ORAL | 0 refills | Status: DC
Start: 2019-01-18 — End: 2019-02-21

## 2019-01-18 MED ORDER — AMOXICILLIN-POT CLAVULANATE 500-125 MG OR TABS
500.0000 mg | ORAL_TABLET | Freq: Two times a day (BID) | ORAL | 0 refills | Status: DC
Start: 2019-01-18 — End: 2019-02-21

## 2019-01-20 ENCOUNTER — Telehealth (INDEPENDENT_AMBULATORY_CARE_PROVIDER_SITE_OTHER): Payer: Self-pay | Admitting: Podiatrist

## 2019-01-20 NOTE — Telephone Encounter (Signed)
General Message:    Detailed Message:   Phone call from patient. He states he has a few questions about his appointment on with Dr. Maudie Mercury on 01/24/2019. Please advise   Return Call: Detailed message on voicemail

## 2019-01-20 NOTE — Telephone Encounter (Signed)
L/m for pt to call me back

## 2019-01-24 ENCOUNTER — Ambulatory Visit (INDEPENDENT_AMBULATORY_CARE_PROVIDER_SITE_OTHER): Payer: PPO | Admitting: Podiatrist

## 2019-01-24 VITALS — Temp 97.8°F

## 2019-01-24 DIAGNOSIS — S92415A Nondisplaced fracture of proximal phalanx of left great toe, initial encounter for closed fracture: Secondary | ICD-10-CM

## 2019-01-24 DIAGNOSIS — M1611 Unilateral primary osteoarthritis, right hip: Secondary | ICD-10-CM

## 2019-01-24 DIAGNOSIS — M2021 Hallux rigidus, right foot: Secondary | ICD-10-CM

## 2019-01-24 DIAGNOSIS — S92414D Nondisplaced fracture of proximal phalanx of right great toe, subsequent encounter for fracture with routine healing: Secondary | ICD-10-CM

## 2019-01-24 MED ORDER — OXYCODONE HCL 5 MG OR TABS
5.0000 mg | ORAL_TABLET | Freq: Four times a day (QID) | ORAL | 0 refills | Status: DC | PRN
Start: 2019-01-24 — End: 2019-08-01

## 2019-01-24 NOTE — Progress Notes (Signed)
Accompanied by:  patient was unaccompanied and spouse    Procedure:  1st MTPJ Arthordesis  RIGHT  PO week #:  11 day post op.  Date of surgery:  01/12/19    Ronnie Mason  is a 68 year old year old male return today for routine post operative check.    This is 1st post operative visit.    The patient states having pain on surgical site.    Patient relates fell last night, very painful, was not wearing boot, lots of bleeding, wife changed dressing twice, very unstable .       The patient states of moderate pain.    The patient is NON-COMPLIANT with post-operative instructions.    Currently instructed post-op weightbearing status is partial weightbearing.    The patient denies of and fever/chill, SOB/CP and calf pain.    Patient denies of side effects of the prescribed medication.    Current post-op pain medication is Tylenol and oxycodone 5mg .    The pain need to be better control.    DVT prophylaxis:  none.

## 2019-01-24 NOTE — Progress Notes (Signed)
Post-Surgery Follow-Up SOAP Note    Patient: Ronnie Mason   Patient DOB: 11/02/1950     DOS:  01/24/2019     Accompanied by:  spouse    Subjective:    Procedure:  1st MTPJ Arthordesis  RIGHT  PO week #:  11 day post op.  Date of surgery:  01/12/19    Ronnie Mason  is a 68 year old year old male return today for routine post operative check.  This is 1st post operative visit.  The patient states having pain on surgical site.  Patient relates increase in pain, he was not wearing boot and walk down stair and fell last night, lots of bleeding, wife changed dressing twice.  He was doing well before the fall.    The patient states of moderate pain.  The patient is NON-COMPLIANT with post-operative instructions.  Currently he was instructed post-op weightbearing status is partial weightbearing using the cast boot.  The patient denies of and fever/chill, SOB/CP and calf pain.  Patient denies of side effects of the prescribed medication.  Current post-op pain medication is Tylenol and oxycodone 5mg .  The pain need to be better control.  DVT prophylaxis:  none since he was ambulating.      PMH:   Past Medical History:   Diagnosis Date   . Carotid Sinus Hypersensitivity    . Disc disorder of lumbosacral region    . Hip injury    . HYPERTENSION     . HYPOTENSION     . Intraparenchymal hemorrhage of brain (HCC) 2017   . Pain of right hip joint 01/23/2016   . 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 Syncope    . 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(HCC)    . URI (upper respiratory infection)         Review of patient's allergies indicates:  Allergies   Allergen Reactions   . Adhesives Rash   . Bee Venom Hives, Itching and Swelling   . Gabapentin      Flu like symptoms, diarrhea, body ache upset stomach        Medications:   Outpatient Medications Prior to Visit   Medication Sig Dispense Refill   . Acetaminophen 500 MG Oral Tab 2 tablets bid     . albuterol HFA 108 (90 Base) MCG/ACT inhaler  Inhale 2 puffs by mouth every 4 hours as needed for shortness of breath/wheezing. (Patient not taking: Reported on 01/13/2019) 1 Inhaler 2   . Alpha-Lipoic Acid 300 MG tablet Take 1 tablet by mouth daily.     [name]. amLODIPine 5 MG tablet Take 1 tablet (5 mg) by mouth 2 times a day. 90 tablet 1   . amoxicillin-clavulanate (Augmentin) 500-125 MG tablet Take 1 tablet (500 mg) by mouth every 12 hours. 20 tablet 0   . amoxicillin-clavulanate (Augmentin) 500-125 MG tablet Take 1 tablet (500 mg) by mouth every 8 hours. Take medication until finished. 20 tablet 0   . atorvastatin 10 MG Oral Tablet Take 1 tablet (10 mg) by mouth daily. 90 tablet 2   . BENFOTIAMINE OR Take 250 mg by mouth daily.     . benzonatate 200 MG capsule Take 1 capsule (200 mg) by mouth at bedtime as needed for cough. (Patient not taking: Reported on 08/25/2018) 30 capsule 0   . Cholecalciferol (VITAMIN D3) 2000 units Oral Cap Take 1 capsule (2,000 Units) by mouth daily. For low level 1 capsule 1   .  Cyanocobalamin 1000 MCG Oral Tab one per day over-the-counter started 5/21 /2012 this level borderline low 1 Tab 1   . diclofenac 1 % gel      . DULoxetine 30 MG DR capsule Take 1 capsule (30 mg) by mouth daily. 90 capsule 1   . DULoxetine 60 MG DR capsule Take 1 capsule (60 mg) by mouth daily. 90 capsule 1   . EPINEPHrine 0.3 MG/0.3ML Injection Solution Auto-injector Inject as instructed per patient package insert, 0.3 mg intramuscularly or subcutaneously into the thigh, if needed to treat anaphylaxis 3 each 1   . Lidocaine 5 % External Patch Apply 1 patch onto the skin daily. Apply to painful area for up to 12 hours in a 24 hour period. 30 patch 10   . lidocaine 5 % patch Apply 1 patch onto the skin daily. Apply to painful area for up to 12 hours in a 24 hour period. 30 patch 0   . lisinopril 20 MG tablet Take 1 tablet (20 mg) by mouth every 12 hours. 180 tablet 3   . meloxicam 7.5 MG tablet TAKE ONE TABLET BY MOUTH EVERY MORNING 60 tablet 3   . memantine 10  MG tablet Take 1 tablet (10 mg) by mouth daily. 30 tablet 0   . oxyCODONE 5 MG tablet Take 1 tablet (5 mg) by mouth every 6 hours as needed for severe pain. 30 tablet 0   . pregabalin 100 MG capsule Take 1 capsule (100 mg) by mouth 2 times a day. 180 capsule 1   . pregabalin 50 MG capsule 1 po bid 180 capsule 1   . primidone 50 MG tablet Take 1 tablet (50 mg) by mouth 2 times a day. 180 tablet 1   . solifenacin 5 MG tablet Take 1 tablet (5 mg) by mouth daily. 30 tablet 11   . thiamine 100 MG tablet Take 1 tablet by mouth daily.      [name]. triamcinolone 0.1 % cream As needed to right ear bid x 1 week 1 Tube 0     No facility-administered medications prior to visit.         Objective:   Vitals:    01/24/19 1503   Temp: 97.8 F (36.6 C)        The dressings are wet from fresh blood.  The surgical sites were examined and the incision(s) has/have well approximate, no dehiscence and no erythema with small laceration on medial hallux.  There is moderate pain on surgical site.  There is moderated edema and is no erythema noted.  There is no signs of DVT i.e. no calf pain, swelling and redness.  Clinically, the post-operative alignments is fair - slightly elevated and there is movement on fusion site.   There is no evidence of allodynia.  Signs of hyperalgesia is absent.  There is no motor inhibition. The skin color is Normal for ethnicity.  The skin temperature is Warm.          DIAGNOSTIC IMAGING:    Right:  AP/LAT/MO/LO foot (CPT- 16109- 73630) views     Finding:   The 1st metatarso-phalangeal joint fusion is/are well opposed/approximated. The fixation is solid and good position. The osseous alignment is good.  However there is mildly displaced fracture noted on the proximal phalanx on hallux on the distal screw fixation site.        Assessment:   (S92.415A) Closed nondisplaced fracture of proximal phalanx of left great toe, initial encounter  (primary encounter diagnosis)  (  M20.21) Hallux rigidus, right foot  (M16.11) Primary  osteoarthritis of right hip     The patient is making set back in progress post-operatively due to fall related fracture of proximal phalanx. The patient appears to be NON COMPLIANT regarding post-operative instruction.    Plan:      Detailed education and discussion took place with the patient regards to mild displaced hallux proximal phalanx fracture, right.  We have reviewed X-ray finding and detail discussion took place with the patient.  Explained that normal healing time- 6-8 weeks.  Answered all of patient's questions.  We have discuss following treatment options:  continue with current care, NWB with short leg cast with crutches and/or knee scooter, non-surgical and surgical treatment - ORIF.   After the discussion, the patient elected NWB with short leg cast with crutches and/or knee scooter and non-surgical for now.  Again, they was informed that this is NWB cast.      A discussion took place regarding post-operative progress.  Patient was instructed to call our office if they notice any significant change is post-operative progress and experience severe pain, swelling, fever/chill, calf pain, SOB.  Currently it is too soon to remove the sutures today and betadine paint, Steri strip was applied, sterile dressings: 4 x 4, Kerlix, and ace bandage.  The hallux was splint using 1" tap to stabilize the hallux.  Precaution were stressed today regards to level of activity/elevation/icing.       Ambulatory status:  The patient was instructed to non weight bearing using crutches, knee scooter and wheel chair.      Short leg cast  A well padded short leg fiberglass was applied to on right leg.  Bony prominences were well padded and the knee was flexed.  In addition, the foot was placed at 90 degree to the leg.  The cast was applied by Scott pedorthist.     DVT prophylaxis:  81 Aspirin bid.      Pain management:  The patient was instructed to use rest, ice, elevation, compression and NSAID as primary care for pain  and swelling.  At this point we agree to use:  oxycodone.      Return/Follow-up:  follow up 1 - 2 weeks for post op check, follow up for incision check/suture removal and x-ray AP/LAT/ MO views foot    Vira Browns DPM, FACFAS  Reconstructive Ankle and Foot Surgeon  [facility]

## 2019-01-24 NOTE — Patient Instructions (Signed)
Ambulatory status:  The patient was instructed to non weight bearing using crutches, knee scooter and wheel chair.        DVT prophylaxis:  81 Aspirin bid.

## 2019-01-25 NOTE — Telephone Encounter (Signed)
Patient asked for a call back today.

## 2019-01-26 ENCOUNTER — Encounter (INDEPENDENT_AMBULATORY_CARE_PROVIDER_SITE_OTHER): Payer: Self-pay | Admitting: Internal Medicine

## 2019-01-26 ENCOUNTER — Ambulatory Visit (INDEPENDENT_AMBULATORY_CARE_PROVIDER_SITE_OTHER): Payer: PPO

## 2019-01-26 ENCOUNTER — Ambulatory Visit (INDEPENDENT_AMBULATORY_CARE_PROVIDER_SITE_OTHER): Payer: PPO | Admitting: Internal Medicine

## 2019-01-26 ENCOUNTER — Ambulatory Visit (HOSPITAL_COMMUNITY): Payer: PPO

## 2019-01-26 ENCOUNTER — Encounter (INDEPENDENT_AMBULATORY_CARE_PROVIDER_SITE_OTHER): Payer: Self-pay

## 2019-01-26 ENCOUNTER — Ambulatory Visit: Payer: PPO | Attending: Internal Medicine

## 2019-01-26 VITALS — BP 100/60 | HR 92 | Resp 16 | Ht 75.5 in | Wt 230.0 lb

## 2019-01-26 DIAGNOSIS — M546 Pain in thoracic spine: Secondary | ICD-10-CM | POA: Insufficient documentation

## 2019-01-26 DIAGNOSIS — W19XXXS Unspecified fall, sequela: Secondary | ICD-10-CM

## 2019-01-26 DIAGNOSIS — R0781 Pleurodynia: Secondary | ICD-10-CM

## 2019-01-26 DIAGNOSIS — Z23 Encounter for immunization: Secondary | ICD-10-CM

## 2019-01-26 DIAGNOSIS — S2231XA Fracture of one rib, right side, initial encounter for closed fracture: Secondary | ICD-10-CM | POA: Insufficient documentation

## 2019-01-26 NOTE — Progress Notes (Signed)
Ronnie Mason is here today to see Dr. Maudie Mercury and myself.  We see Dr. Julianne Rice notes on today's visit.  While he is here we changed his low profile cast.  He is complaining of pressure on the front of his tibia.  Also it is quite obvious that he has been weightbearing on this as the cast is completely broken in the heel area especially.  Dr. Maudie Mercury observe this and proceeded to help with the removal of the old cast.  Also he helped with the replacement of this with a new cast that went further up Ronnie Mason's leg.  First Dr. Maudie Mercury wrapped him with an Ace bandage to help with some compression as is he is not been really great at keeping the leg elevated thus it is quite swollen today.  Also he has taken a tumble down some stairs.  He is going to see his other doctors for solutions with ribs and shoulder issues.  Dr. Maudie Mercury utilized a cast stand for better positioning of his foot and ankle.  After application of 4 inch fiberglass over a well-padded cast prewrap, we also added a heel reinforcement along with utilization of blue 2 inch fiberglass for extra support as well as coloring.  He was instructed to stay off of this but to let us know if there are any issues or problems.

## 2019-01-26 NOTE — Progress Notes (Signed)
Vaccine Screening Questions    Interpreter: No    1. Are you allergic to Latex? NO    2.  Have you had a serious reaction or an allergic reaction to a vaccine?  NO    3.  Currently have a moderate or severe illness, including fever?  NO    4.  Ever had a seizure or any neurological problem associated with a vaccine? (DTaP/TDaP/DTP pertinent) NO    5.  Is patient receiving any live vaccinations today? (Varicella-Chickenpox, MMR-Measles/Mumps/Rubella, Zoster-Shingles, Flumist, Yellow Fever) NOTE: oral rotavirus is exempt  NO    If YES to any of the questions above - Do NOT give vaccine.  Consult with RN or provider in clinic.  (#5 can be YES if all Live vaccine questions are answered NO)    If NO to all questions above - Patient may receive vaccine.    6.  Do you need to receive the Flu vaccine today? NO      All patients are encouraged to wait 15 minutes before leaving after receiving any vaccine.    VIS given 01/26/2019 by Delsa Bern, [place].

## 2019-01-26 NOTE — Progress Notes (Signed)
[facility]  PRIMARY [facility]  [address]  [place] [address]  [phone]  Fax   [phone]    First building on the right as you enter the campus    Ronnie Mason is a 68 year old male       BP 100/60   Pulse 92   Resp 16   Ht 6' 3.5" (1.918 m)   Wt (!) 230 lb (104.3 kg)   BMI 28.37 kg/m   Chief Complaint   Patient presents with   . Fall     09/24/2018   . Back Pain     due to fall     Patient seen with  His wife      IMPRESSION / PLAN / DISCUSSION   (W19.XXXS) Accidental fall, sequela  (primary encounter diagnosis)  (M54.6) Acute right-sided thoracic back pain  (R07.81) Rib pain on right side  (Z23) Need for pneumococcal vaccination    Quientin was seen today for fall and back pain.    Diagnoses and all orders for this visit:    Accidental fall, sequela  -     XR T SPINE 3 VW; Future  -     XR RIBS UNILAT 2 VW RIGHT; Future    Acute right-sided thoracic back pain  -     XR T SPINE 3 VW; Future  -     XR RIBS UNILAT 2 VW RIGHT; Future    Rib pain on right side  -     XR T SPINE 3 VW; Future  -     XR RIBS UNILAT 2 VW RIGHT; Future    Need for pneumococcal vaccination  -     pneumococcal (Pneumovax 23) polysaccharide vacc (adult) 0.5 mL IM or SUBCUTANEOUS - [phone]      Unfortunate fell down the one step broke the  R toe   May have broken the rib on back right too - it is intermittently and sharp    X ray suspect fx     Already on lyrica pain med  Symptoms are sharp  Will x ray- if fx could take few months to heal     telemed w me in 2 wks     He missed a step  He said he had not had excessive alcohol  But he did have a boot on and it was hard to maneuver       I spent a total time of 25 minutes face-to-face with the patient, of which more than 50% was spent counseling and coordinating care as outlined in this note.        No follow-ups on file.  Health Maintainace reviewed with patient today that are due  Health Maintenance   Topic Date Due   . Zoster Vaccine (2 of 3) 07/17/2013   .  Pneumococcal Vaccine: 65+ years (2 of 2 - PPSV23) 09/17/2018   . Depression Screening (PHQ-2)  07/26/2019   . Lipid Disorders Screening  08/16/2019   . Colorectal Cancer Screening  09/24/2020   . Diabetes Screening  08/15/2021   . DTaP, Tdap, and Td Vaccines (4 - Td) 11/29/2025   . Influenza Vaccine  Completed   . Hepatitis C Screening  Addressed   . Hepatitis B Vaccine  Aged Out   . Pneumococcal Vaccine: Pediatrics (0-5 years) and At-Risk Patients (6-64 years)  Aged Out        Patient   express understanding of care plan/medications  Created with the assistance of voice recognition software dictation    Risk of taking medication properly and follow up discussed especially to call if problem  MEDICATION as of Discharge  Current Outpatient Medications   Medication Sig Dispense Refill   . Acetaminophen 500 MG Oral Tab 2 tablets bid     . albuterol HFA 108 (90 Base) MCG/ACT inhaler Inhale 2 puffs by mouth every 4 hours as needed for shortness of breath/wheezing. (Patient not taking: Reported on 01/13/2019) 1 Inhaler 2   . Alpha-Lipoic Acid 300 MG tablet Take 1 tablet by mouth daily.     [name] amLODIPine 5 MG tablet Take 1 tablet (5 mg) by mouth 2 times a day. 90 tablet 1   . amoxicillin-clavulanate (Augmentin) 500-125 MG tablet Take 1 tablet (500 mg) by mouth every 12 hours. 20 tablet 0   . amoxicillin-clavulanate (Augmentin) 500-125 MG tablet Take 1 tablet (500 mg) by mouth every 8 hours. Take medication until finished. 20 tablet 0   . atorvastatin 10 MG Oral Tablet Take 1 tablet (10 mg) by mouth daily. 90 tablet 2   . BENFOTIAMINE OR Take 250 mg by mouth daily.     . Cholecalciferol (VITAMIN D3) 2000 units Oral Cap Take 1 capsule (2,000 Units) by mouth daily. For low level 1 capsule 1   . Cyanocobalamin 1000 MCG Oral Tab one per day over-the-counter started 5/21 /2012 this level borderline low 1 Tab 1   . diclofenac 1 % gel      . DULoxetine 30 MG DR capsule Take 1 capsule (30 mg) by mouth daily. 90 capsule 1   .  DULoxetine 60 MG DR capsule Take 1 capsule (60 mg) by mouth daily. 90 capsule 1   . EPINEPHrine 0.3 MG/0.3ML Injection Solution Auto-injector Inject as instructed per patient package insert, 0.3 mg intramuscularly or subcutaneously into the thigh, if needed to treat anaphylaxis 3 each 1   . Lidocaine 5 % External Patch Apply 1 patch onto the skin daily. Apply to painful area for up to 12 hours in a 24 hour period. 30 patch 10   . lidocaine 5 % patch Apply 1 patch onto the skin daily. Apply to painful area for up to 12 hours in a 24 hour period. 30 patch 0   . lisinopril 20 MG tablet Take 1 tablet (20 mg) by mouth every 12 hours. 180 tablet 3   . meloxicam 7.5 MG tablet TAKE ONE TABLET BY MOUTH EVERY MORNING (Patient not taking: Reported on 01/26/2019) 60 tablet 3   . memantine 10 MG tablet Take 1 tablet (10 mg) by mouth daily. 30 tablet 0   . oxyCODONE 5 MG tablet Take 1 tablet (5 mg) by mouth every 6 hours as needed for severe pain. 20 tablet 0   . oxyCODONE 5 MG tablet Take 1 tablet (5 mg) by mouth every 6 hours as needed for severe pain. 30 tablet 0   . pregabalin 100 MG capsule Take 1 capsule (100 mg) by mouth 2 times a day. 180 capsule 1   . pregabalin 50 MG capsule 1 po bid 180 capsule 1   . primidone 50 MG tablet Take 1 tablet (50 mg) by mouth 2 times a day. 180 tablet 1   . solifenacin 5 MG tablet Take 1 tablet (5 mg) by mouth daily. 30 tablet 11   . thiamine 100 MG tablet Take 1 tablet by mouth daily.      [name] triamcinolone 0.1 % cream As  needed to right ear bid x 1 week 1 Tube 0     No current facility-administered medications for this visit.         ________________________  Start of visit   Ronnie Mason is a 68 year old male                   BP 100/60   Pulse 92   Resp 16   Ht 6' 3.5" (1.918 m)   Wt (!) 230 lb (104.3 kg)   BMI 28.37 kg/m   Chief Complaint   Patient presents with   . Fall     09/24/2018   . Back Pain     due to fall     HPI  History of:    pcp [name], MD    Recent  re do to surgery   Ho back pain neck pain - lyrica and duloxetine        Patient Care Team:  [name], MD as PCP - General (Internal Medicine)  [name], MD (Internal Medicine)  Jeanella Craze., Bryan Lemma, MD as Cardiologist  (Cardiology)  Geri Seminole, MD (Sports Medicine)  Holli Humbles, DPM (Podiatry)  Dixon Boos, MD (Orthopedic Surgery)  Clydene Pugh, MD (Gastroenterology)  Casandra Doffing, MD (General Surgery)  Reggie Pile, ARNP (Urology)       Today follow-up above issues:     tue 12 day post up for right toe and in cast    Missed the bottom basement stair 01/23/2019   fx xray 6/23 - at visit w Dr [name] now scooter    When fell upper back pain by right scapula  Went to pt yesterday     Not sob  When crashed into basement landed In right elbow     But elbow and arm ok  Not sob  Neck ok       Erroneous Encounter on 01/10/19   1. COVID-19 Coronavirus Qualitative PCR   Result Value Ref Range    COVID-19 Coronavirus Qual PCR Specimen Type Nasopharyngeal Swab     COVID-19 Coronavirus Qual PCR Result None detected.  NDET    COVID-19 Coronavirus Qual PCR Interpretation       Performed using the [employer] (R) SARS-CoV-2 real-time RT-PCR assay. An inconclusive result is most likely an indication of virus present near the limit of detection and should be treated as a result of   Detected. Additional details available here:  [email or web address]         Xr Foot 3 Vw Right    Result Date: 01/25/2019  EXAMINATION: XR FOOT MIN 3 VIEWS RIGHT  CLINICAL INDICATION: right foot post op  COMPARISON: Radiographs of the right foot dated March 14, 2018.  FINDINGS AND IMPRESSION: The patient is status post 1st MTP joint arthrodesis, transfixed with plate and screws. Hardware is intact.  There is a mildly displaced fracture through the proximal phalanx of the great  toe, with mild dorsal displacement of the distal fracture fragment.  There is regional soft  tissue swelling. ATTENDING RADIOLOGIST AND PAGER NUMBER [phone] [facility] Jesus Genera MD [phone]    Xr Foot 3 Vw Right    Result Date: 01/24/2019  EXAMINATION: XR FOOT MIN 3 VIEWS RIGHT  CLINICAL INDICATION: right foot post op  COMPARISON: Radiographs of the right foot dated March 14, 2018.  FINDINGS AND IMPRESSION: The patient is status post 1st MTP joint arthrodesis, transfixed with plate and screws.  Hardware is intact.  There is a mildly displaced fracture through the proximal phalanx of the great  toe, with mild dorsal displacement of the distal fracture fragment.  There is regional soft tissue swelling. ATTENDING RADIOLOGIST AND PAGER NUMBER [phone] [facility] CHRISTINE MARIE MD      MEDICATION PRIOR TO VISIT  Reports   Outpatient Medications Prior to Visit   Medication Sig Dispense Refill   . Acetaminophen 500 MG Oral Tab 2 tablets bid     . albuterol HFA 108 (90 Base) MCG/ACT inhaler Inhale 2 puffs by mouth every 4 hours as needed for shortness of breath/wheezing. (Patient not taking: Reported on 01/13/2019) 1 Inhaler 2   . Alpha-Lipoic Acid 300 MG tablet Take 1 tablet by mouth daily.     [name] amLODIPine 5 MG tablet Take 1 tablet (5 mg) by mouth 2 times a day. 90 tablet 1   . amoxicillin-clavulanate (Augmentin) 500-125 MG tablet Take 1 tablet (500 mg) by mouth every 12 hours. 20 tablet 0   . amoxicillin-clavulanate (Augmentin) 500-125 MG tablet Take 1 tablet (500 mg) by mouth every 8 hours. Take medication until finished. 20 tablet 0   . atorvastatin 10 MG Oral Tablet Take 1 tablet (10 mg) by mouth daily. 90 tablet 2   . BENFOTIAMINE OR Take 250 mg by mouth daily.     . benzonatate 200 MG capsule Take 1 capsule (200 mg) by mouth at bedtime as needed for cough. (Patient not taking: Reported on 08/25/2018) 30 capsule 0   . Cholecalciferol (VITAMIN D3) 2000 units Oral Cap Take 1 capsule (2,000 Units) by mouth daily. For low level 1 capsule 1   . Cyanocobalamin 1000 MCG Oral Tab one per day over-the-counter started  5/21 /2012 this level borderline low 1 Tab 1   . diclofenac 1 % gel      . DULoxetine 30 MG DR capsule Take 1 capsule (30 mg) by mouth daily. 90 capsule 1   . DULoxetine 60 MG DR capsule Take 1 capsule (60 mg) by mouth daily. 90 capsule 1   . EPINEPHrine 0.3 MG/0.3ML Injection Solution Auto-injector Inject as instructed per patient package insert, 0.3 mg intramuscularly or subcutaneously into the thigh, if needed to treat anaphylaxis 3 each 1   . Lidocaine 5 % External Patch Apply 1 patch onto the skin daily. Apply to painful area for up to 12 hours in a 24 hour period. 30 patch 10   . lidocaine 5 % patch Apply 1 patch onto the skin daily. Apply to painful area for up to 12 hours in a 24 hour period. 30 patch 0   . lisinopril 20 MG tablet Take 1 tablet (20 mg) by mouth every 12 hours. 180 tablet 3   . meloxicam 7.5 MG tablet TAKE ONE TABLET BY MOUTH EVERY MORNING (Patient not taking: Reported on 01/26/2019) 60 tablet 3   . memantine 10 MG tablet Take 1 tablet (10 mg) by mouth daily. 30 tablet 0   . oxyCODONE 5 MG tablet Take 1 tablet (5 mg) by mouth every 6 hours as needed for severe pain. 20 tablet 0   . oxyCODONE 5 MG tablet Take 1 tablet (5 mg) by mouth every 6 hours as needed for severe pain. 30 tablet 0   . pregabalin 100 MG capsule Take 1 capsule (100 mg) by mouth 2 times a day. 180 capsule 1   . pregabalin 50 MG capsule 1 po bid 180 capsule 1   . primidone 50 MG  tablet Take 1 tablet (50 mg) by mouth 2 times a day. 180 tablet 1   . solifenacin 5 MG tablet Take 1 tablet (5 mg) by mouth daily. 30 tablet 11   . thiamine 100 MG tablet Take 1 tablet by mouth daily.      [name] triamcinolone 0.1 % cream As needed to right ear bid x 1 week 1 Tube 0     No facility-administered medications prior to visit.              REVIEW OF SYSTEMS - are otherwise negative unless as noted in HPI above or as additional issues below  Constitutional:     Above     PHYSICAL EXAM  BP 100/60   Pulse 92   Resp 16   Ht 6' 3.5" (1.918 m)    Wt (!) 230 lb (104.3 kg)   BMI 28.37 kg/m   Well Dressed     Constitution, In general looks:   well , good coloring  No diaphoresis  Patient is Alert  & Attentive  Patient is conversive    Psychiatric Mental Status / Neurologic Mental Status:  Affect:  Pleasant calm  Mood:  Good  Awake: Alert and attentive    Not confused  Thought::     content : no gross issue   Process : logical  Language:   Speech is fluent  Intact comprehension    Eyes: no discharge, no gross puffiness, non- icteric        Respiratory:   Not tachyphpnic  Clear to ascultation        Neurologic:   Grossly intact  Memory:   Attention       Cranial Nerve    Eyes pupil and lid are intact   II, III, IV, VIII   Facial Movement VII: grossly intact, no facial droop      Musculoskeletal:  No gross abnormal movement, twitch   No gross atrophy or deformity    Skin:  No gross lesion or rash  No bruising  No spine tenderness on thoracic  Tender in right paraspinus and R scapula     rom shoulder it self is ok  Neck he said not affected     Hematologic/Lymphatic /Immunologic:  No gross bruising or rash or hives     ++++++++++++++++++++++++++++++++++++++++++++++++++  Allergy  Review of patient's allergies indicates:  Allergies   Allergen Reactions   . Adhesives Rash   . Bee Venom Hives, Itching and Swelling   . Gabapentin      Flu like symptoms, diarrhea, body ache upset stomach       ++++++++++++++++++++++++++++++++++++++++++++++++++    Patient Active Problem List    Diagnosis Date Noted   . Syncope [R55]    . Hypotension [I95.9]    . 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 URI (upper respiratory infection) [J06.9]    . Enuresis [R32] 09/06/2018   . Chronic left-sided low back pain without sciatica [M54.5, G89.29] 06/21/2018   . Acute left-sided low back pain without sciatica [M54.5] 03/14/2018   . Greater trochanteric pain syndrome of right lower extremity [M25.551] 11/25/2017   . Complex tear of medial meniscus of left knee as current injury  [S83.232A] 09/08/2017   . Primary osteoarthritis of left knee [M17.12] 09/08/2017   . Right lumbar radiculitis [M54.16] 05/05/2017   . 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] 01/11/2017     Osteoarthritis of right hip  x ray report - moderate to advanced radiology read 01/11/2017     . 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 Impaired mobility and ADLs [Z74.09] 04/08/2016   . Difficulty in walking, not elsewhere classified [R26.2] 03/12/2016   . Vitamin D deficiency [E55.9] 02/13/2016   . Balance problem [R26.89] 01/23/2016   . Chronic pain of both shoulders [M25.511, G89.29, M25.512] 01/23/2016     X ray arthritis  02/19/2016       . 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 Essential hypertension [I10] 12/16/2015   . Essential tremor [G25.0] 12/16/2015   . Hx of spontan intraparenchymal intracran bleed assoc with hypertension [Z86.79] 12/16/2015     [place] 4/29- 12/10/2015  Non-traumatic intraparenchymal hemorrhage, hypertensive of the left thalamus   Traumatic hemorrhage   -- falcine subdural hemorrhage, small cortical subarachnoid     . Alcohol abuse [F10.10] 12/16/2015   . 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 History of alcohol dependence (HCC) [F10.21] 06/26/2015   . 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 Idiopathic peripheral neuropathy [G60.9] 03/15/2015   . 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 Tremor [R25.1] 04/20/2013   . Fall [W19.XXXA] 02/23/2013     BP < 100  Occasioanal tri[p and leg weakness     . Back pain, lumbosacral [M54.5]  02/15/2013   . Lumbar radiculopathy, chronic [M54.16] 02/15/2013   . Neck pain [M54.2] 02/15/2013   . Chronic pain [G89.29] 12/21/2012     [facility] at [facility], DO, [place] Gladebecca Capen    Neck pain -burn c3-7 on left side per patient  Some trigger injection    Lumbar pain-since 80's better with exercise     . Bee sting allergy [Z91.030] 12/21/2012     hives     . Numbness and tingling of leg [R20.0, R20.2] 12/21/2012     Left calf -left foot long term suspect from back-suspect L 45     . Chronic back pain [M54.9, G89.29] 12/21/2012     Mri in mid scape 1/214  Multilevel degenerative disc disease of the lumbar spine. Circumferential disc bulge at all levels below and including L1-2. No significant central spinal canal stenosis or neuroforaminal narrowing.  2. Mild retrolisthesis of L5 on S1.           . B12 deficiency [E53.8] 12/21/2012     Borderline low 5 /21/2014     . Chronic renal insufficiency, stage III (moderate) (HCC) [N18.3] 12/21/2012     5/ 2013     . Hyperlipidemia [E78.5] 09/26/2012   . Cervicalgia [M54.2] 03/11/2010     Radiofrequency ablation of c3 to c 7           Past Medical History:   Diagnosis Date   . Carotid Sinus Hypersensitivity    . Disc disorder of lumbosacral region    . Hip injury    . HYPERTENSION     . HYPOTENSION     . Intraparenchymal hemorrhage of brain (HCC) 2017   . Pain of right hip joint 01/23/2016   . 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 Syncope    . 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(HCC)    .  URI (upper respiratory infection)        Social History     Socioeconomic History   . Marital status: Married     Spouse name: Not on file   . Number of children: Not on file   . Years of education: Not on file   . Highest education level: Not on file   Occupational History   . Not on file   Social Needs   . Financial resource strain: Not on file   . Food insecurity     Worry: Not on file     Inability: Not on file   . Transportation needs     Medical: Not on  file     Non-medical: Not on file   Tobacco Use   . Smoking status: Never Smoker   . Smokeless tobacco: Former Estate agentUser   Substance and Sexual Activity   . Alcohol use: Yes   . Drug use: No   . Sexual activity: Not on file   Lifestyle   . Physical activity     Days per week: Not on file     Minutes per session: Not on file   . Stress: Not on file   Relationships   . Social Wellsite geologistconnections     Talks on phone: Not on file     Gets together: Not on file     Attends religious service: Not on file     Active member of club or organization: Not on file     Attends meetings of clubs or organizations: Not on file     Relationship status: Not on file   . Intimate partner violence     Fear of current or ex partner: Not on file     Emotionally abused: Not on file     Physically abused: Not on file     Forced sexual activity: Not on file   Other Topics Concern   . 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  Non-smoker, 14 or more drinks/week almost exclusively wine with dinner.                Family History     Problem (# of Occurrences) Relation (Name,Age of Onset)    Colon Cancer (1) Father    Heart (other) (1) Mother: MI    Other Family Hx (1) Other: myelodysplasia          Social History     Socioeconomic History   . Marital status: Married     Spouse name: Not on file   . Number of children: Not on file   . Years of education: Not on file   . Highest education level: Not on file   Occupational History   . Not on file   Social Needs   . Financial resource strain: Not on file   . Food insecurity     Worry: Not on file     Inability: Not on file   . Transportation needs     Medical: Not on file     Non-medical: Not on file   Tobacco Use   . Smoking status: Never Smoker   . Smokeless tobacco: Former Estate agentUser   Substance and Sexual Activity   . Alcohol use: Yes   . Drug use: No   . Sexual activity: Not on file   Lifestyle   . Physical activity     Days  per  week: Not on file     Minutes per session: Not on file   . Stress: Not on file   Relationships   . Social Wellsite geologistconnections     Talks on phone: Not on file     Gets together: Not on file     Attends religious service: Not on file     Active member of club or organization: Not on file     Attends meetings of clubs or organizations: Not on file     Relationship status: Not on file   . Intimate partner violence     Fear of current or ex partner: Not on file     Emotionally abused: Not on file     Physically abused: Not on file     Forced sexual activity: Not on file   Other Topics Concern   . 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  Non-smoker, 14 or more drinks/week almost exclusively wine with dinner.

## 2019-01-26 NOTE — Progress Notes (Signed)
Pt called today to come in and get his cast worked on, feels like its to tight and hurts in shin area. We scheduled him for 1pm.  When he comes in , he is still putting weight on heel mostly, but finds it difficult to [facility] roller aid and get up and down from chair, (though he is hurting from his fall he took, so moving slowly from that). When cast was examined it was in poor shape and it had only been two days, heel was broken up, so  It was decided to redo the cast. Dr. Maudie Mercury came in and did majority of first part of applying the cast. Pt is not putting his foot up enough so is dealing with a lot of swelling. New cast was applied with reinforcement, pt will see how it goes. And will return in a few weeks.

## 2019-01-30 ENCOUNTER — Encounter (INDEPENDENT_AMBULATORY_CARE_PROVIDER_SITE_OTHER): Payer: Self-pay

## 2019-01-30 ENCOUNTER — Encounter (INDEPENDENT_AMBULATORY_CARE_PROVIDER_SITE_OTHER): Payer: Self-pay | Admitting: Podiatrist

## 2019-01-30 NOTE — Progress Notes (Signed)
Pt called to let us know [place] was [place] weekend, wanted to know if we needed to see him before hand, we discussed how is foot was doing, he said the cast was fine, just very large and bulky, can't do to much with it, which I told him was the idea. He needed to stay low and elevate the heck out of it. Especially now that he is traveling. He understands this.  He did go see his doctor regarding his shoulder pain, he said he has a fractured scapula, but she would not give him pain meds, that we would be the ones managing his pain,  I did let him know we could only do that regarding his foot.  He will see [place] back July 8th.

## 2019-01-31 ENCOUNTER — Telehealth (INDEPENDENT_AMBULATORY_CARE_PROVIDER_SITE_OTHER): Payer: Self-pay | Admitting: Internal Medicine

## 2019-01-31 DIAGNOSIS — Z8679 Personal history of other diseases of the circulatory system: Secondary | ICD-10-CM

## 2019-01-31 DIAGNOSIS — I10 Essential (primary) hypertension: Secondary | ICD-10-CM

## 2019-01-31 MED ORDER — AMLODIPINE BESYLATE 5 MG OR TABS
5.0000 mg | ORAL_TABLET | Freq: Two times a day (BID) | ORAL | 3 refills | Status: DC
Start: 2019-01-31 — End: 2019-08-10

## 2019-01-31 NOTE — Telephone Encounter (Signed)
Set up refill from fax.    Last OV 01/26/2019

## 2019-01-31 NOTE — Addendum Note (Signed)
Addended by: Hoyle Sauer on: 01/31/2019 12:13 PM     Modules accepted: Level of Service, SmartSet

## 2019-01-31 NOTE — Progress Notes (Addendum)
Patient was seen on 01/10/2019 at the [facility] drive up site where a sample of dual nasal pharyngeal collection was taken. The specimen was sent to the [place] lab for COVID-19 testing.  Patient will be informed of test results within 48 hours.  Patient received informational instructions on self-care.    The specimen was collected by: Kaelyn Stanton, RN.

## 2019-01-31 NOTE — Patient Instructions (Signed)
Evaluation for COVID-19  Testing, Result Information, Symptom Management    Who is being tested for COVID-19?  [facility] is testing patients for COVID-19 for one of two main reasons:  1. Patients who have symptoms that may be related to COVID-19  2. Patients who do not have symptoms, but have an upcoming surgery or procedure that requires routine COVID-19 testing beforehand    If you are experiencing symptoms, what do we believe you have?  You have a viral syndrome, which may include symptoms like muscle aches, fevers, chills, runny nose, cough, sneezing, sore throat, vomiting or diarrhea.     SARS-CoV-2, the virus that causes COVID-19, is one of the potential viruses you may have. You may be just as likely to have a different viral infection such as the common cold or flu.    Most patients with COVID-19 have mild symptoms and recover on their own. Resting, staying hydrated, and sleeping are typically helpful. As of today's visit, you are well enough to go home and treat your symptoms with oral fluids and medicines for fevers, cough, pain, etc. If your symptoms worsen, you should seek additional medical care.    Why is COVID-19 testing being performed before my surgery/procedure?  The safety of our patients and staff is our top priority. We are performing COVID-19 testing before certain surgeries and procedures to maintain everyone's safety and help prevent others from getting infected or exposed.    When will I receive results for my COVID-19 test?  If COVID-19 testing is performed, the results should be available in 1-2 days. You may find testing follow-up instructions here: [email or web address]    Who can I contact for questions?  Call [phone] for any COVID-19 questions or if your symptoms are worsening. Please allow 48 hours for results to finalize before contacting us about your result status.    How do I receive results?  Please do not contact the Emergency  Department or clinic for results of this test. Please wait to be contacted as outlined below and do not go to your doctor's office for results.    IF THE RESULT IS POSITIVE OR INCONCLUSIVE  A member of the [facility] team will call you for further discussion. You may also view your result in eCare or through a QR code you may receive at your testing site.    IF THE RESULT IS NEGATIVE  You will receive this information by phone, via eCare, or a QR code you may receive at your testing site.    Pre-surgical evaluations  A member of your surgery team will review your results and contact you if needed. Please remain isolated until your surgery date to reduce the risk of COVID-19 exposure.    eCare  If you are a [facility] patient, eCare ([email or web address]) is the fastest way to receive your results. Results will be released to eCare within one hour of being posted in our system, and you may receive your results before we are able to contact you.    QR Code  You may receive a QR code label at the time of your test. If you do not have an eCare account, you may use the QR code label to view your results at [email or web address]. You will not receive a notification when your result is ready to view on this site, but you can visit the site as many times as you wish to check the result status.    What do I do while I wait   for my test results?   Stay home except to get medical care. Do not return to work or your regular activities outside of home. Remain isolated until you receive your results.    After receiving your results, follow the instructions here: [email or web address]    Please follow the precautions below:   Stay home except to get medical care.     Do not go to work, school, or public areas. Avoid using public transportation, ride-sharing, or taxis.   Separate yourself from other people in your home as much as possible.   Stay in a specific room and away  from other people in your home as much as possible. Use a separate bathroom if possible.   Do not share household items with other people in your home.   This includes sharing dishes, drinking glasses, cups, eating utensils, towels or bedding. After using these items, they should be washed thoroughly with soap and water.   Clean all "high-touch" surfaces regularly.   This includes counters, tabletops, doorknobs, bathroom fixtures, toilets, phones, keyboards, tablets and bedside tables. Also, clean any surfaces that may have blood, stool or body fluids on them. Use a household cleaning spray or wipe, according to the label instructions.   Cover your coughs and sneezes with a tissue, mask or the inside of your elbow.   Throw used tissues in a lined trash can; immediately wash your hands with soap and water for at least 20 seconds or clean your hands with an alcohol-based hand sanitizer that contains at least 60% alcohol. Soap and water should be used if hands are visibly dirty.   When seeking care at a healthcare facility:   Seek prompt medical attention if your illness is worsening (e.g., difficulty breathing).   When possible, call the healthcare provider before arriving.   Put on a facemask before you enter the facility.   If possible, put on a facemask before the ambulance or paramedics arrive.   These steps will help the healthcare provider's office prevent other people from getting infected or exposed.        Please see the resources below for more information  Information Lines  [place] [facility] COVID-19 Call Center: 1-800-525-0127   [facility] COVID-19 Line: [phone]    [facility] Websites  COVID-19 Information  [email or web address]    [place] Department of Health Websites  General Facts on COVID-19  doh.[place].gov/Emergencies/NovelCoronavirusOutbreak2020/FactSheet    What to do if you were potentially exposed to someone with confirmed coronavirus disease  (COVID-19)  doh.wa.gov/Portals/1/Documents/1600/coronavirus/COVIDexposed.pdf    Centers for Disease Control and Prevention (CDC) Websites  COVID-19 FAQs:  cdc.gov/coronavirus/2019-ncov/faq.html    What to do if you are sick: cdc.gov/coronavirus/2019-ncov/if-you-are-sick/steps-when-sick.html

## 2019-02-01 ENCOUNTER — Other Ambulatory Visit (INDEPENDENT_AMBULATORY_CARE_PROVIDER_SITE_OTHER): Payer: Self-pay | Admitting: Podiatrist

## 2019-02-01 ENCOUNTER — Other Ambulatory Visit (INDEPENDENT_AMBULATORY_CARE_PROVIDER_SITE_OTHER): Payer: Self-pay | Admitting: Internal Medicine

## 2019-02-01 DIAGNOSIS — M2021 Hallux rigidus, right foot: Secondary | ICD-10-CM

## 2019-02-01 DIAGNOSIS — S92415A Nondisplaced fracture of proximal phalanx of left great toe, initial encounter for closed fracture: Secondary | ICD-10-CM

## 2019-02-01 DIAGNOSIS — I10 Essential (primary) hypertension: Secondary | ICD-10-CM

## 2019-02-01 DIAGNOSIS — Z8679 Personal history of other diseases of the circulatory system: Secondary | ICD-10-CM

## 2019-02-02 NOTE — Telephone Encounter (Signed)
General Message:    Detailed Message:   Phone call from patient. HE would like his medications filled today and a call back when this has been completed. Please advise   Return Call: Detailed message on voicemail

## 2019-02-03 ENCOUNTER — Other Ambulatory Visit (INDEPENDENT_AMBULATORY_CARE_PROVIDER_SITE_OTHER): Payer: Self-pay

## 2019-02-03 ENCOUNTER — Other Ambulatory Visit (INDEPENDENT_AMBULATORY_CARE_PROVIDER_SITE_OTHER): Payer: Self-pay | Admitting: Podiatrist

## 2019-02-03 DIAGNOSIS — S92415A Nondisplaced fracture of proximal phalanx of left great toe, initial encounter for closed fracture: Secondary | ICD-10-CM

## 2019-02-03 MED ORDER — OXYCODONE HCL 5 MG OR TABS
5.0000 mg | ORAL_TABLET | Freq: Four times a day (QID) | ORAL | 0 refills | Status: DC | PRN
Start: 2019-02-03 — End: 2019-08-01

## 2019-02-07 ENCOUNTER — Other Ambulatory Visit (HOSPITAL_BASED_OUTPATIENT_CLINIC_OR_DEPARTMENT_OTHER): Payer: Self-pay | Admitting: Anesthesiology

## 2019-02-07 DIAGNOSIS — G629 Polyneuropathy, unspecified: Secondary | ICD-10-CM

## 2019-02-07 NOTE — Telephone Encounter (Signed)
Medication: Memantine  Requested by: Pharmacy    Last refill written: 12/29/2018  Written by: Dr. Lisbeth Ply    Last appointment: 12/29/2018 with Dr. Lisbeth Ply  Scheduled follow up: 03/02/2019 with Dr. [name] [phone]) Identity ID   [id number] Sex   Male DOB   [birth year]   Medication order tracking    Outpatient Medication Detail    memantine 10 MG tablet        Sig: Take 1 tablet (10 mg) by mouth daily.        Sent to pharmacy as: Memantine HCl 10 MG Oral Tablet        Class: Normal        Route: Oral        Order: [id number]        E-Prescribing Status: Receipt confirmed by pharmacy (12/29/2018 4:40 PM PDT)        Order Ronnie Mason Method   E-Prescribed [3]   Pharmacy    [name] [phone]    Associated Diagnoses:    Neuropathy [G62.9]       Order Providers    Prescribing Provider Encounter Provider   Art Buff, MD Art Buff, MD   Medication Detail    Medication Quantity Refills Start End   memantine 10 MG tablet 30 tablet 0 12/29/2018 01/28/2019   Sig: Comment:   Take 1 tablet (10 mg) by mouth daily.    Admin Instructions:      Route: PRN Reasons: PRN Comment:   Oral     Order Class: Number of Doses: Order #: DAW:   Normal 30 [id number] No   NDC: LOT #: MAR COMMENT:        Discontinuing user Discontinue Reason Order End Date Order End Time     Jan 28, 2019 23:59 PDT   Order Information    Date and Time Department Ordering/Authorizing   12/29/2018 4:40 PM [facility] Art Buff, MD   Electronically signed by    Art Buff

## 2019-02-08 ENCOUNTER — Ambulatory Visit: Payer: PPO | Attending: Podiatrist

## 2019-02-08 ENCOUNTER — Telehealth (INDEPENDENT_AMBULATORY_CARE_PROVIDER_SITE_OTHER): Payer: Self-pay | Admitting: Podiatrist

## 2019-02-08 ENCOUNTER — Ambulatory Visit (INDEPENDENT_AMBULATORY_CARE_PROVIDER_SITE_OTHER): Payer: PPO | Admitting: Podiatrist

## 2019-02-08 VITALS — BP 129/82 | HR 92

## 2019-02-08 DIAGNOSIS — Z01818 Encounter for other preprocedural examination: Secondary | ICD-10-CM

## 2019-02-08 DIAGNOSIS — S92421A Displaced fracture of distal phalanx of right great toe, initial encounter for closed fracture: Secondary | ICD-10-CM

## 2019-02-08 LAB — COVID-19 CORONAVIRUS QUALITATIVE PCR: COVID-19 Coronavirus Qual PCR Result: NOT DETECTED

## 2019-02-08 MED ORDER — OXYCODONE HCL 5 MG OR TABS
5.0000 mg | ORAL_TABLET | Freq: Four times a day (QID) | ORAL | 0 refills | Status: DC | PRN
Start: 2019-02-08 — End: 2019-08-01

## 2019-02-08 MED ORDER — MEMANTINE HCL 10 MG OR TABS
ORAL_TABLET | ORAL | 0 refills | Status: DC
Start: 2019-02-08 — End: 2019-03-02

## 2019-02-08 NOTE — Progress Notes (Signed)
Covid 19 test completed as patient is having surgery tomorrow by Dr. Maudie Mercury. Culture taken to [facility] for a stat read. MBechen RN

## 2019-02-08 NOTE — Progress Notes (Addendum)
PRE-OPERATIVE HISTORY AND PHYSCIAL EXAMINATION    Patient: Ronnie Mason   Patient DOB: 02/28/1951     DOS:  02/08/2019     Accompanied by:  spouse    Chief Complaint: Ronnie Mason is a 68 year old year old male who presents today for post-op visit with fracture proximal phalanx from a fall during recovery.  The x-ray reveal screw backing out and increase angulation of the fracture which we agree to undergone ORIF and change to visit to pre-operative visit    History of present Illness:     Planned date of surgery:  02/09/19 - added on today  The patient had undergone 1st MTPJ arthrodesis on 01/12/19.  Unfortunately, against medical advice, he walked down stair barefoot(without cast boot) and fell.  He has fractured the proximal phalanx distal to the fixation.  Today's x-ray reveal, screw is backing out and increase angulation of the hallux.  He states of having moderate pain.  He denies of fever/chill/SOB and CP.  At fiber glass was applied with NWB instruction but he has not been compliant base the cast wear.      History of MRSA:  Negative    Possibility of Pregnancy: N/A    72 hours Covid 19 test will be taken:  Yes    Prior anesthesia issue:  none    PCP:  Shann MedalKraft, Vara Viseskul, MD     PMH:   Past Medical History:   Diagnosis Date   . Carotid Sinus Hypersensitivity    . Disc disorder of lumbosacral region    . Hip injury    . HYPERTENSION     . HYPOTENSION     . Intraparenchymal hemorrhage of brain (HCC) 2017   . Pain of right hip joint 01/23/2016   . 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 Syncope    . 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(HCC)    . URI (upper respiratory infection)         Review of patient's allergies indicates:  Allergies   Allergen Reactions   . Adhesives Rash   . Bee Venom Hives, Itching and Swelling   . Gabapentin      Flu like symptoms, diarrhea, body ache upset stomach        Medications:   Outpatient Medications Prior to Visit   Medication Sig Dispense  Refill   . Acetaminophen 500 MG Oral Tab 2 tablets bid     . albuterol HFA 108 (90 Base) MCG/ACT inhaler Inhale 2 puffs by mouth every 4 hours as needed for shortness of breath/wheezing. (Patient not taking: Reported on 01/13/2019) 1 Inhaler 2   . Alpha-Lipoic Acid 300 MG tablet Take 1 tablet by mouth daily.     [name]. amLODIPine 5 MG tablet Take 1 tablet (5 mg) by mouth 2 times a day. 90 tablet 3   . amoxicillin-clavulanate (Augmentin) 500-125 MG tablet Take 1 tablet (500 mg) by mouth every 12 hours. 20 tablet 0   . amoxicillin-clavulanate (Augmentin) 500-125 MG tablet Take 1 tablet (500 mg) by mouth every 8 hours. Take medication until finished. 20 tablet 0   . atorvastatin 10 MG Oral Tablet Take 1 tablet (10 mg) by mouth daily. 90 tablet 2   . BENFOTIAMINE OR Take 250 mg by mouth daily.     . Cholecalciferol (VITAMIN D3) 2000 units Oral Cap Take 1 capsule (2,000 Units) by mouth daily. For low level 1 capsule 1   . Cyanocobalamin 1000  MCG Oral Tab one per day over-the-counter started 5/21 /2012 this level borderline low 1 Tab 1   . diclofenac 1 % gel      . DULoxetine 30 MG DR capsule Take 1 capsule (30 mg) by mouth daily. 90 capsule 1   . DULoxetine 60 MG DR capsule Take 1 capsule (60 mg) by mouth daily. 90 capsule 1   . EPINEPHrine 0.3 MG/0.3ML Injection Solution Auto-injector Inject as instructed per patient package insert, 0.3 mg intramuscularly or subcutaneously into the thigh, if needed to treat anaphylaxis 3 each 1   . Lidocaine 5 % External Patch Apply 1 patch onto the skin daily. Apply to painful area for up to 12 hours in a 24 hour period. 30 patch 10   . lidocaine 5 % patch Apply 1 patch onto the skin daily. Apply to painful area for up to 12 hours in a 24 hour period. 30 patch 0   . lisinopril 20 MG tablet Take 1 tablet (20 mg) by mouth every 12 hours. 180 tablet 3   . meloxicam 7.5 MG tablet TAKE ONE TABLET BY MOUTH EVERY MORNING (Patient not taking: Reported on 01/26/2019) 60 tablet 3   . memantine 10 MG  tablet Take 1 tablet (10 mg) by mouth daily. 30 tablet 0   . oxyCODONE 5 MG tablet Take 1 tablet (5 mg) by mouth every 6 hours as needed. 20 tablet 0   . oxyCODONE 5 MG tablet Take 1 tablet (5 mg) by mouth every 6 hours as needed for severe pain. 20 tablet 0   . oxyCODONE 5 MG tablet Take 1 tablet (5 mg) by mouth every 6 hours as needed for severe pain. 30 tablet 0   . pregabalin 100 MG capsule Take 1 capsule (100 mg) by mouth 2 times a day. 180 capsule 1   . pregabalin 50 MG capsule 1 po bid 180 capsule 1   . primidone 50 MG tablet Take 1 tablet (50 mg) by mouth 2 times a day. 180 tablet 1   . solifenacin 5 MG tablet Take 1 tablet (5 mg) by mouth daily. 30 tablet 11   . thiamine 100 MG tablet Take 1 tablet by mouth daily.      [name] triamcinolone 0.1 % cream As needed to right ear bid x 1 week 1 Tube 0     No facility-administered medications prior to visit.         PSH:   Past Surgical History:   Procedure Laterality Date   . ANES; COLONOSCOPY  2002    repeat in 3 years   . ANES; COLONOSCOPY & POLYPECTOMY  11/18/2007    repeat in 3 years   . HALLUX RIGIDUS W/CHEILECTOMY 1ST MP JT W/O IMPLT Right 10/21/2017    Dr. Megan Salon   . L meniciscus Left 07/2017    by Dr Matthew Saras , re did left menisicus    . toe surgery  Right 01/12/2019    Dr Wilmon Arms    . UNLISTED PROCEDURE FEMUR/KNEE     . UNLISTED PROCEDURE HANDS/FINGERS     . UNLISTED PROCEDURE SPINE  2013    rfa to c3 to c 7         Family History:  family history includes Colon Cancer in his father; Heart (other) in his mother; Other Family Hx in an other family member.    Social History:  Social History     Socioeconomic History   . Marital status: Married  Spouse name: Not on file   . Number of children: Not on file   . Years of education: Not on file   . Highest education level: Not on file   Occupational History   . Not on file   Social Needs   . Financial resource strain: Not on file   . Food insecurity     Worry: Not on file     Inability: Not on file   .  Transportation needs     Medical: Not on file     Non-medical: Not on file   Tobacco Use   . Smoking status: Never Smoker   . Smokeless tobacco: Former Estate agent and Sexual Activity   . Alcohol use: Yes   . Drug use: No   . Sexual activity: Not on file   Lifestyle   . Physical activity     Days per week: Not on file     Minutes per session: Not on file   . Stress: Not on file   Relationships   . Social Wellsite geologist on phone: Not on file     Gets together: Not on file     Attends religious service: Not on file     Active member of club or organization: Not on file     Attends meetings of clubs or organizations: Not on file     Relationship status: Not on file   . Intimate partner violence     Fear of current or ex partner: Not on file     Emotionally abused: Not on file     Physically abused: Not on file     Forced sexual activity: Not on file   Other Topics Concern   . 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  Non-smoker, 14 or more drinks/week almost exclusively wine with dinner.               Review of Systems   Constitutional: Negative    Eyes: Deferred   Ears, Nose, Mouth, Throat: Deferred   Cardiovascular: Negative    Respiratory: Negative    Gastrointestinal: Negative   Genitourinary: Deferred   Musculoskeletal: As noted in HPI above   Skin: Negative    Neurological: Negative    Psychiatric: Deferred   Endocrine: Negative    Hematologic/Lymphatic: Negative   Allergic/Immunologic: Deferred     Physical Exam:   Vitals:    02/08/19 0935   BP: 129/82   BP Cuff Size: Regular   BP Site: Right Arm   BP Position: Sitting   Pulse: 92        General: The patient is White Well developed, appearing stated age and in no acute distress  Pleasant, well-developed, well-nourished. Alert and oriented to person, place, and time.  No apparent distress.    Head/face:    normocephalic, atraumatic, no lesions    Eyes:   PEERL/EOMI    Ears, Nose, Mouth and Throat:   Hearing:  Normal as tested by [finger rub, tuning fork, whispered voice].  , Nose:  normal nasal mucosa, septum and turbinates, Mouth:  Lips, gums, tongue and oral mucosa are normal.  Teeth appear healthy., Pharynx:  oropharynx shows normal tonsils and adenoids without post-nasal drainage. supple, no thyromegaly    Neck and Thyroid:  normal,supple,nontender to palpation,no adenopathy     Heart:  regular rate and rhythm, no  murmurs, rubs or gallops    Respiratory:   clear to ausculation nl resp effort    Abdomen:  soft, normoactive bowel sounds, nontender and obese    LOWER EXTREMITIES EXAM:  Vascular:   RIGHT:  Dorsalis pedis pulse is palpable.  Posterior tibial pulse is palpable. Capillary filling time note to be immediate  on the distal hallux.      Neurologic:  RIGHT:  The sensation is intact on the foot/ankle.  Motor coordination is intact with normal muscle tone.  There is no manifestations of pathological reflexes noted.      Dermatological:   RIGHT:   The surgical scar is dehisced at 1st MTPJ region without erythema, drainage, odor.  .     Musculoskeletal:   RIGHT:   There is moderated pain on palpation of the shaft of 1st metatarsal where is the tinting on skin. There is swelling noted over the affect region.  There is erythema from the screw prominence.      DIAGNOSTIC IMAGING:    Right:  AP/LAT/LO foot (CPT- 40981- 73630) views     Finding:   There is fracture noted on the shaft proximal phalanx on hallux distal to the plate fixation.  The 1st metatarso-phalangeal joint fusion site note to have widen on lateral view. The long screw fixation is backing out. The osseous alignment is satisfactory on the fusion..    Assessment:   (X91.478G(S92.421A) Closed displaced fracture of distal phalanx of right great toe, initial encounter  (primary encounter diagnosis)  Displacement of fusion site, RIGHT    Detailed education and discussion took place with the patient/wire regards to  displaced hallux proximal phalanx fracture and screw backing out, right.  We have reviewed x-ray finding and detail discussion took place with the patient.  Explained that normal healing time- 6-8 weeks.  Answered all of patient's questions.  We have discuss following treatment options:  continue with current care and surgical treatment - ORIF with revision of 1st MTPJ fusion.   After the discussion, the patient elected surgical treatment - ORIF with revision of 1st MTPJ fusion.       Planned procedure(s):    ORIF proximal phalanx-hallux(28505), RIGHT  Revision fusion of 1st MTPJ, RIGHT    The purpose of these procedure(s): address the displace fracture of proximal phalanx and separation of fusion site which will alleviate the symptom complex.   Patient understands and rationale for the surgery.      Alternative treatments: live with current condition    Detailed pre & post-operative instructions and recommendation to given.  The weight bearing status will be:  NWB for 6-8 weeks  with short leg cast using following modality to assist ambulation: crutches and knee scooter.   Patient was instructed not to drive if operated foot is driving foot.   Patient was educated about elevating and icing the surgical foot to control pain and swelling.  And patient was instructed to keep the dressing dry and not to remove the dressing.  Preoperative Hibiclens bathing instructions and sponge was given.      Rx dispensed: Oxycodone 5 mg.   Patient was instructed not to drive while on opioid until affect was realize.  For precautionary measure, I have Rx Augmentin for post op due to redness/hematoma.      DME: Patient has crutches and A knee scooter    Anesthesia: General anesthesia however final determination will be referred to anesthesiologist.  Patient position:  supine    Consent:  The patient  has been advised of the approximate disability involved for these procedures.  In addition, the patient has been advised as to the  alternatives of care, including continued conservative care as well as surgical procedures.  The patient understands that if surgical procedures are performed, there are risks and complications that could occur, including but not limited to: hematoma formation, development of a DVT or phlebitis, infection, painful scar tissue formation, limited motion, delayed-union, non-union, mal-union, reaction to implanted biomaterials, over-correction, under-correction with recurrence of the deformities, CRPS, continued pain, and the possibility that future surgery may need to be performed.  The patient was given the opportunity to ask questions which were answered to the best of my ability.  The patient voiced no concerns, will consider all these options, and schedule accordingly.  No guarantee given.     Pre-op medical clearance:  by Dr. Selena BattenKim.    Pre-operative risk factors:   1.  DVT risk factors are prolong non weight bearing.  The preventive measure for DVT are regular leg/ankle exercises with compression sock was ordered and early mobilization with/without weight bearing and 81 Aspirin bid.    2.  Prophylaxis for cardiac events with perioperative beta-blockers:  No.    3.  Pre-op labs:  Patient's age: 68 year old yo, BMI: 6728 (Age 965-74 - ECG, Age > 2775 - CBC/BMP, BMI >40 & poor exercise tolerance - ECG, Diuretic ACEI or ARB - BMP, DM - ECG, BMP, A1c): Yes[facility]    4. Prophylaxis for infection:  4% CHG E-Z scrub surgical scrub bottle was dispensed prepping the surgical extremity where patient was instructed scrub the extremity night before and morning of.   Additionally, the surgical extremity will be scrub at the hospital twice at [facility] and in OR room just before surgery.  Patient will also receive pre-operative IV antibiotic and possibly 2nd dose of IV antibiotic if needed.      5.  Change in medication regimen before surgery:  Discontinue NSAIDS/ASA and supplements 7 days prior to surgery.       6.  Post-op steroid  coverage:  No     7.  Metabolic/bone healing risk factors:  No      Return appointment: follow up 1 - 2 weeks for post op check    TIME SPENT:  40 minutes was spent face-to-face with patient, greater than 50% of which was spent counseling and coordinating care. They were given an opportunity to ask questions which were answered in a detailed manner.         Randa Spikeony D.H. Kim DPM, FACFAS  Reconstructive Ankle and Foot Surgeon  [facility]

## 2019-02-08 NOTE — Patient Instructions (Signed)
PREOPERATIVE DISCUSSION:    Planned procedure(s):    ORIF proximal phalanx-hallux(28505), RIGHT    The purpose of these procedure(s): address the displace fracture of proximal phalanx which will alleviate the symptom complex.   Patient understands and rationale for the surgery.      Alternative treatments: live with current condition    Detailed pre & post-operative instructions and recommendation to given.  The weight bearing status will be:  NWB for 6-8 weeks  with short leg cast using following modality to assist ambulation: crutches and knee scooter.   Patient was instructed not to drive if operated foot is driving foot.   Patient was educated about elevating and icing the surgical foot to control pain and swelling.  And patient was instructed to keep the dressing dry and not to remove the dressing.  Preoperative Hibiclens bathing instructions and sponge was given.      Rx dispensed: Oxycodone 5 mg.   Patient was instructed not to drive while on opioid until affect was realize.      DME: Patient has crutches and A knee scooter    Anesthesia: General anesthesia however final determination will be referred to anesthesiologist.  Patient position:  supine      POSSIBLE RISKS INHERENT TO ANY SURGERY:   Infection   development of a DVT(blood clot) or phlebitis   Excessive bleeding - hematoma formation and swelling   painful scar tissue formation   Numbness and nerve entrapment   limited motion   delayed-union , non-union and/or mal-union   reaction to implanted biomaterials   over-correction, under-correction with recurrence of the deformities   CRPS (severe nerve pain)   continued pain   possibility that future surgery may need to be performed   Amputation/death   General reaction to anesthesia/death   No guarantee given.     Pre-op medical clearance:  by Dr. Selena BattenKim.    Pre-operative risk factors:   1.  A blood clot on your leg (DVT) or a clot travel to your lung (PE) is a serious potential complication  during your recovery.   Although the risk is low for DVT/PE (Blood clot) for foot and ankle surgery, we recommend preventive measures.   Based on our assessment, the preventive measure for DVT are regular leg/ankle exercises with compression sock and early mobilization with/without weight bearing and 81 Aspirin bid.    2. Prophylaxis for infection:  4% CHG E-Z scrub surgical scrub bottle is given for prepping the surgical extremity where patient was instructed scrub the extremity night before and morning of.   Additionally, the surgical [facility] in OR room just before surgery.  Patient will also receive pre-operative IV antibiotic and possibly 2nd dose of IV antibiotic if needed.      3.  Changes in medication regimen before surgery:  Discontinue NSAIDS/ASA and supplements 7 days prior to surgery.     Preventing Deep Vein Thrombosis After Surgery  In the days and weeks after surgery, you have a higher chance of developing a deep vein thrombosis (DVT). This is a condition in which a blood clot or thrombus develops in a deep vein. They are most common in the leg. But, a DVT may develop in an arm, or another deep vein in the body. A piece of the clot, called an embolus, can separate from the vein and travel to the lungs. A blood clot in the lungs is called a pulmonary embolus (PE). This  can cut off the flow of blood to the lungs. It is a medical emergency and may cause death.    Healthcare providers use the term venous thromboembolism (VTE) to describe both DVT and PE. They use the term VTE because the two conditions are very closely related. And, because their prevention and treatment are closely related.      A patient with a sequential compression device.     Prevention in the hospital or other facility  Your healthcare provider will usually prescribe one or more of the following to prevent blood clots:   Anticoagulant. This is medicine that prevents blood clots. You  take it by mouth, by injection, or through an IV. Commonly used anticoagulants include warfarin and heparin. Newer anticoagulants may also be used, including rivaroxaban, apixaban, dabigatran and enoxaparin. Sometimes your healthcare provider may not give you an anticoagulant medicine. It is important that he or she discuss the risks and benefits with you, and document them.    Compression stockings. These are elastic stockings that fit tightly around your legs. They help keep blood flowing toward your heart by the pressure they apply. They prevent blood from pooling and forming blood clots. When you first put them on, the stockings may be uncomfortable. But after a while, you should get used to them.    Exercises. Simple exercises while you are resting in bed or sitting in a chair can help prevent blood clots. Move your feet in a circle or up and down. Do this 10 times an hour to improve circulation.   Ambulation (getting out of bed and walking). After surgery, a nurse will help you out of bed, as soon as you are able. Moving around improves circulation and helps prevent blood clots.   Sequential compression device (SCD) or intermittent pneumatic compression (IPC). Plastic sleeves are wrapped around your legs and connected to a pump that inflates and deflates the sleeves. This applies gentle pressure to promote blood flow in the legs and prevent blood clots. Remove the sleeves so that you do not trip or fall when you are walking. For example, when you use the bathroom or shower. If you need help removing the sleeves, ask for help.    Prevention at home     Ankle exercises can help keep blood flowing in the veins.     Deep vein thrombosis can happen even after you go home. Follow all instructions from your healthcare provider. The following are some general guidelines about DVT prevention:    Anticoagulant medicine. If an anticoagulant was prescribed, make sure you follow all directions about taking it. Be sure  you know what foods and  medicines may interact. Also, ask your healthcare provider what to do if you forget to take a dose.   Compression stockings. Your healthcare provider will tell you how often to wear and remove the stockings. Follow all instructions closely. Each time you remove your stockings, check your legs and feet for reddened areas or sores. If you see any changes, call your healthcare provider right away.   Returning to activity. Follow all instructions about returning to activities. Be as active as you can. This improves blood flow and helps prevent a clot from forming. When  in bed or in a chair, continue with the ankle exercises you did in the hospital.   Sequential compression device (SCD) or intermittent pneumatic compression (IPC). In certain situations, this device may be recommended at home. If you are using this device at home, make  sure you closely follow all instructions from your healthcare provider. You will be instructed on how often and for how long to use the device. Again, remove the sleeves if you are up and walking.    When to get help  You may have signs or symptoms of a blood clot. Or, you may have signs or symptoms of bleeding from medicines to prevent clots.  Call 911 if you have the following:   Chest pain   Shortness of breath   Fast heartbeat   Excessive sweating   Fainting   Coughing (may cough up blood)   Heavy or uncontrolled bleeding    Call your healthcare provider if you have the following:   Pain, swelling, or redness in the leg, arm, or other area   Blood in the urine or stool   Very dark or tar-like stool   Vomiting with blood   Bleeding from the nose   Bleeding from the gums   A cut that will not stop bleeding   Bleeding from the vagina       PLEASE CALL WITH ANY QUESTIONS:  [phone]

## 2019-02-08 NOTE — Telephone Encounter (Signed)
Physician performing procedure:  Dr. Maudie Mercury    Surgery Scheduling Logistics:   Procedure: R FOOT PROXIMAL PHALANX-HALLUX ORIF  Date: 02/09/2019  Location of Procedure: [facility]  Case status: outpatient  Diagnosis/ICD10: F74.944H  Primary CPT: 28505  Additional CPT(s) including possible procedures     Insurance:  [place]: [employer] Telephone Number:   Group Number:     Veterinary surgeon ID:   Insurance Rep:     Pre Authorization Needed: N   PA Number:   Authorization Status:    Reference #:   Josem Kaufmann Received Date/Time:   Auth Received Via:   If auth received via website insert Screenshot here:   Fax, or Letter scanned to media:  /Date    Referral from PCP Needed:    Second Opinion Needed:

## 2019-02-08 NOTE — Progress Notes (Signed)
Post-Surgery Follow-Up SOAP Note    Patient: Ronnie Mason   Patient DOB: Mar 06, 1951     DOS:  02/08/2019     Accompanied by:  spouse    Subjective:    Procedure:1st MTPJ ArthordesisRIGHT  PO week #:3 week post op. Date of surgery:01/12/19    Joss Mcdill is a 68 year old year old malereturn today for routine post operative check. This is 2nd post operative visit. The patient states ofmoderatepain. The patient isNON-COMPLIANT with post-operative instructions. Currently he was instructed post-op weightbearing status ispartial weightbearing using the cast boot. The patientdenies of and fever/chill, SOB/CP and calf pain. Patientdenies of side effects of the prescribed medication. Current post-op pain medication isTylenol and oxycodone 5mg . The painneed to be better control. DVT prophylaxis:none since he was ambulating.

## 2019-02-09 ENCOUNTER — Ambulatory Visit
Admission: RE | Admit: 2019-02-09 | Discharge: 2019-02-09 | Disposition: A | Discharge status: Other Acute Inpatient Hospital | Payer: PPO | Attending: Podiatrist | Admitting: Podiatrist

## 2019-02-09 ENCOUNTER — Ambulatory Visit (HOSPITAL_COMMUNITY): Payer: Self-pay | Admitting: Podiatrist

## 2019-02-09 ENCOUNTER — Ambulatory Visit (HOSPITAL_COMMUNITY)
Admission: RE | Admit: 2019-02-09 | Discharge: 2019-02-09 | Disposition: A | Discharge status: Home / Self Care | Payer: PPO | Source: Other Acute Inpatient Hospital | Attending: Podiatrist | Admitting: Podiatrist

## 2019-02-09 ENCOUNTER — Other Ambulatory Visit: Payer: Self-pay | Admitting: Podiatrist

## 2019-02-09 DIAGNOSIS — S92421A Displaced fracture of distal phalanx of right great toe, initial encounter for closed fracture: Secondary | ICD-10-CM

## 2019-02-09 DIAGNOSIS — Y9389 Activity, other specified: Secondary | ICD-10-CM | POA: Insufficient documentation

## 2019-02-09 DIAGNOSIS — I959 Hypotension, unspecified: Secondary | ICD-10-CM | POA: Insufficient documentation

## 2019-02-09 DIAGNOSIS — M25374 Other instability, right foot: Secondary | ICD-10-CM

## 2019-02-09 DIAGNOSIS — M545 Low back pain: Secondary | ICD-10-CM | POA: Insufficient documentation

## 2019-02-09 DIAGNOSIS — W1789XA Other fall from one level to another, initial encounter: Secondary | ICD-10-CM | POA: Insufficient documentation

## 2019-02-09 DIAGNOSIS — I10 Essential (primary) hypertension: Secondary | ICD-10-CM | POA: Insufficient documentation

## 2019-02-09 DIAGNOSIS — Z4789 Encounter for other orthopedic aftercare: Secondary | ICD-10-CM | POA: Insufficient documentation

## 2019-02-09 DIAGNOSIS — R251 Tremor, unspecified: Secondary | ICD-10-CM | POA: Insufficient documentation

## 2019-02-09 DIAGNOSIS — E785 Hyperlipidemia, unspecified: Secondary | ICD-10-CM | POA: Insufficient documentation

## 2019-02-09 HISTORY — PX: TOE SURGERY: SHX1073

## 2019-02-09 MED ORDER — AMOXICILLIN-POT CLAVULANATE 500-125 MG OR TABS
500.0000 mg | ORAL_TABLET | Freq: Two times a day (BID) | ORAL | 0 refills | Status: AC
Start: 2019-02-09 — End: 2019-02-19

## 2019-02-09 NOTE — Addendum Note (Signed)
Addended by: Elie Goody on: 02/09/2019 06:41 PM     Modules accepted: Orders

## 2019-02-10 LAB — EKG 12 LEAD
Atrial Rate: 87 {beats}/min
Diagnosis: NORMAL
P Axis: 15 degrees
P-R Interval: 162 ms
Q-T Interval: 368 ms
QRS Duration: 70 ms
QTC Calculation: 442 ms
R Axis: 31 degrees
T Axis: 32 degrees
Ventricular Rate: 87 {beats}/min

## 2019-02-13 ENCOUNTER — Encounter (INDEPENDENT_AMBULATORY_CARE_PROVIDER_SITE_OTHER): Payer: Self-pay

## 2019-02-13 ENCOUNTER — Other Ambulatory Visit (INDEPENDENT_AMBULATORY_CARE_PROVIDER_SITE_OTHER): Payer: Self-pay | Admitting: Foot & Ankle Surgery

## 2019-02-13 DIAGNOSIS — S92421A Displaced fracture of distal phalanx of right great toe, initial encounter for closed fracture: Secondary | ICD-10-CM

## 2019-02-13 MED ORDER — OXYCODONE HCL 5 MG OR TABS
5.0000 mg | ORAL_TABLET | Freq: Four times a day (QID) | ORAL | 0 refills | Status: DC | PRN
Start: 2019-02-13 — End: 2019-08-01

## 2019-02-13 MED ORDER — NARCAN 4 MG/0.1ML NA LIQD
NASAL | 0 refills | Status: AC
Start: 2019-02-13 — End: 2020-02-13

## 2019-02-13 NOTE — Progress Notes (Signed)
Pt request more pain med, he says he has been off his foot, elevating, but he is a 6 out of 10 pain with foot itself. Shoulder is better. Will check with a dpm to see if they will refill for a small amount until Dr Maudie Mercury gets back. Will call pt back

## 2019-02-21 ENCOUNTER — Ambulatory Visit (INDEPENDENT_AMBULATORY_CARE_PROVIDER_SITE_OTHER): Payer: PPO | Admitting: Podiatrist

## 2019-02-21 ENCOUNTER — Ambulatory Visit: Payer: PPO | Attending: Podiatrist

## 2019-02-21 VITALS — BP 111/68 | Temp 97.2°F

## 2019-02-21 DIAGNOSIS — S92421A Displaced fracture of distal phalanx of right great toe, initial encounter for closed fracture: Secondary | ICD-10-CM

## 2019-02-21 DIAGNOSIS — S92421D Displaced fracture of distal phalanx of right great toe, subsequent encounter for fracture with routine healing: Secondary | ICD-10-CM

## 2019-02-21 DIAGNOSIS — M2021 Hallux rigidus, right foot: Secondary | ICD-10-CM | POA: Insufficient documentation

## 2019-02-21 LAB — CBC, DIFF
% Basophils: 1 %
% Eosinophils: 8 %
% Immature Granulocytes: 1 %
% Lymphocytes: 21 %
% Monocytes: 9 %
% Neutrophils: 60 %
% Nucleated RBC: 0 %
Absolute Eosinophil Count: 0.7 10*3/uL — ABNORMAL HIGH (ref 0.00–0.50)
Absolute Lymphocyte Count: 1.77 10*3/uL (ref 1.00–4.80)
Basophils: 0.11 10*3/uL (ref 0.00–0.20)
Hematocrit: 46 % (ref 38–50)
Hemoglobin: 14.9 g/dL (ref 13.0–18.0)
Immature Granulocytes: 0.04 10*3/uL (ref 0.00–0.05)
MCH: 33.7 pg — ABNORMAL HIGH (ref 27.3–33.6)
MCHC: 32.1 g/dL — ABNORMAL LOW (ref 32.2–36.5)
MCV: 105 fL — ABNORMAL HIGH (ref 81–98)
Monocytes: 0.79 10*3/uL (ref 0.00–0.80)
Neutrophils: 5.02 10*3/uL (ref 1.80–7.00)
Nucleated RBC: 0 10*3/uL
Platelet Count: 264 10*3/uL (ref 150–400)
RBC: 4.42 10*6/uL (ref 4.40–5.60)
RDW-CV: 13.1 % (ref 11.6–14.4)
WBC: 8.43 10*3/uL (ref 4.3–10.0)

## 2019-02-21 LAB — C_REACTIVE PROTEIN: C_Reactive Protein: 17.4 mg/L — ABNORMAL HIGH (ref 0.0–10.0)

## 2019-02-21 MED ORDER — AMOXICILLIN-POT CLAVULANATE 500-125 MG OR TABS
500.0000 mg | ORAL_TABLET | Freq: Two times a day (BID) | ORAL | 0 refills | Status: DC
Start: 2019-02-21 — End: 2019-03-21

## 2019-02-21 NOTE — Patient Instructions (Signed)
Ambulatory status:  The patient was instructed to continue non weight bearing using short leg cast.     Wound care instruction:  Apply betadine paint bid with sterile 4 x 4 gauze and cover the window.

## 2019-02-21 NOTE — Addendum Note (Signed)
Addended by: Denton Lank on: 02/21/2019 03:15 PM     Modules accepted: Orders

## 2019-02-21 NOTE — Progress Notes (Signed)
Accompanied by:  patient was unaccompanied and spouse    Procedure:  Closed displaced fracture of distal phalanx of right great toe,   Displacement of fusion site, RIGHT   RIGHT  PO week #:  11 day post op.  Date of surgery:  01/10/19    Ronnie Mason  is a 68 year old year old male return today for routine post operative check.    This is 1st post operative visit.    The patient states improving with minor discomfort.    Patient relates no new problems/complaints.       The patient states of mild and moderate pain.    The patient is compliant with post-operative instructions.    Currently instructed post-op weightbearing status is non weightbearing. But putting weight on it when he moves around or gets up to put his leg on scooter  The patient denies of and fever/chill, SOB/CP and calf pain.    Patient denies of side effects of the prescribed medication.    Current post-op pain medication is Tylenol.    The pain need to be better control.    DVT prophylaxis:  81 Aspirin bid.

## 2019-02-21 NOTE — Progress Notes (Signed)
Post-Surgery Follow-Up SOAP Note    Patient: Ronnie Mason   Patient DOB: 11/07/1950     DOS:  02/21/2019     Accompanied by:  spouse    Subjective:    Procedure:  Closed displaced fracture of distal phalanx of right great toe,   Displacement of fusion site, RIGHT   RIGHT  PO week #:  11 day post op.  Date of surgery:  01/10/19    Ronnie Mason  is a 68 year old year old male return today for routine post operative check.  This is 1st post operative visit.  The patient states improving with minor discomfort.  Patient relates no new problems/complaints.     The patient states of mild and moderate pain.  The patient is compliant with post-operative instructions.  Currently instructed post-op weightbearing status is non weightbearing but still putting weight on it when he moves around or gets up to put his leg on scooter.  The patient denies of and fever/chill, SOB/CP and calf pain.  Patient denies of side effects of the prescribed medication.      Current post-op pain medication is Tylenol.    The pain need to be better control.    DVT prophylaxis:  81 Aspirin bid.      PMH:   Past Medical History:   Diagnosis Date   . Carotid Sinus Hypersensitivity    . Disc disorder of lumbosacral region    . Hip injury    . HYPERTENSION     . HYPOTENSION     . 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 Pain of right hip joint 01/23/2016   . 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 Syncope    . 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([place])    . URI (upper respiratory infection)         Review of patient's allergies indicates:  Allergies   Allergen Reactions   . Adhesives Rash   . Bee Venom Hives, Itching and Swelling   . Gabapentin      Flu like symptoms, diarrhea, body ache upset stomach        Medications:   Outpatient Medications Prior to Visit   Medication Sig Dispense Refill   . Acetaminophen 500 MG Oral Tab 2 tablets bid     . albuterol HFA 108 (90 Base) MCG/ACT inhaler Inhale 2 puffs  by mouth every 4 hours as needed for shortness of breath/wheezing. (Patient not taking: Reported on 01/13/2019) 1 Inhaler 2   . Alpha-Lipoic Acid 300 MG tablet Take 1 tablet by mouth daily.     [name] amLODIPine 5 MG tablet Take 1 tablet (5 mg) by mouth 2 times a day. 90 tablet 3   . amoxicillin-clavulanate (Augmentin) 500-125 MG tablet Take 1 tablet (500 mg) by mouth every 12 hours. 20 tablet 0   . amoxicillin-clavulanate (Augmentin) 500-125 MG tablet Take 1 tablet (500 mg) by mouth every 8 hours. Take medication until finished. 20 tablet 0   . atorvastatin 10 MG Oral Tablet Take 1 tablet (10 mg) by mouth daily. 90 tablet 2   . BENFOTIAMINE OR Take 250 mg by mouth daily.     . Cholecalciferol (VITAMIN D3) 2000 units Oral Cap Take 1 capsule (2,000 Units) by mouth daily. For low level 1 capsule 1   . Cyanocobalamin 1000 MCG Oral Tab one per day over-the-counter started 5/21 /2012 this level borderline low 1 Tab 1   . diclofenac 1 %  gel      . DULoxetine 30 MG DR capsule Take 1 capsule (30 mg) by mouth daily. 90 capsule 1   . DULoxetine 60 MG DR capsule Take 1 capsule (60 mg) by mouth daily. 90 capsule 1   . EPINEPHrine 0.3 MG/0.3ML Injection Solution Auto-injector Inject as instructed per patient package insert, 0.3 mg intramuscularly or subcutaneously into the thigh, if needed to treat anaphylaxis 3 each 1   . Lidocaine 5 % External Patch Apply 1 patch onto the skin daily. Apply to painful area for up to 12 hours in a 24 hour period. 30 patch 10   . lidocaine 5 % patch Apply 1 patch onto the skin daily. Apply to painful area for up to 12 hours in a 24 hour period. 30 patch 0   . lisinopril 20 MG tablet Take 1 tablet (20 mg) by mouth every 12 hours. 180 tablet 3   . meloxicam 7.5 MG tablet TAKE ONE TABLET BY MOUTH EVERY MORNING (Patient not taking: Reported on 01/26/2019) 60 tablet 3   . memantine 10 MG tablet TAKE ONE TABLET BY MOUTH ONCE DAILY 30 tablet 0   . naloxone (Narcan) 4 MG/0.1ML nasal spray Use 1 spray in one  nostril for suspected opioid overdose. Call 911. If unresponsive in 2 to 3 minutes, repeat with new naloxone nasal spray. 2 each 0   . oxyCODONE 5 MG tablet Take 1 tablet (5 mg) by mouth every 6 hours as needed for severe pain. 10 tablet 0   . oxyCODONE 5 MG tablet Take 1 tablet (5 mg) by mouth every 6 hours as needed for severe pain. 20 tablet 0   . oxyCODONE 5 MG tablet Take 1 tablet (5 mg) by mouth every 6 hours as needed. 20 tablet 0   . oxyCODONE 5 MG tablet Take 1 tablet (5 mg) by mouth every 6 hours as needed for severe pain. 20 tablet 0   . oxyCODONE 5 MG tablet Take 1 tablet (5 mg) by mouth every 6 hours as needed for severe pain. 30 tablet 0   . pregabalin 100 MG capsule Take 1 capsule (100 mg) by mouth 2 times a day. 180 capsule 1   . pregabalin 50 MG capsule 1 po bid 180 capsule 1   . primidone 50 MG tablet Take 1 tablet (50 mg) by mouth 2 times a day. 180 tablet 1   . solifenacin 5 MG tablet Take 1 tablet (5 mg) by mouth daily. 30 tablet 11   . thiamine 100 MG tablet Take 1 tablet by mouth daily.      [name]. triamcinolone 0.1 % cream As needed to right ear bid x 1 week 1 Tube 0     No facility-administered medications prior to visit.         Objective:   Vitals:    02/21/19 1029   BP: 111/68   BP Cuff Size: Regular   BP Site: Right Arm   BP Position: Sitting   Temp: 97.2 F (36.2 C)        The dressings are clean, dry and intact with mild dry blood.  The surgical sites were examined and the incision(s) has erythema distally at MTPJ(likely related to hematoma) and proximal redness has resolved and partial dehisced (<935mm) at 1st MTPJ fusion site.  There is no proximal proximal lymphangitis/cellulitis.  There is moderate pain on incision.  There is mild edema.  There is no signs of DVT i.e. no calf pain, swelling  and redness.  Clinically, the post-operative alignments is good.   There is no evidence of allodynia.  Signs of hyperalgesia is absent.  There is no motor inhibition. The skin color is Normal for  ethnicity.  The skin temperature is Warm.    The two wire pins are stable and intact.  There is no signs of infection.        DIAGNOSTIC IMAGING:    Right:  AP/LAT/LO foot (CPT- 16109- 73630) views     Finding:   The 1st metatarso-phalangeal joint and hallux proximal phalanx  fusion and fracture  is/are well opposed/approximated. The fixation is solid and good position. The osseous alignment is good.    Assessment:   (S92.421A) Closed displaced fracture of distal phalanx of right great toe, initial encounter  (primary encounter diagnosis)  (M20.21) Hallux rigidus, right foot     The patient is making fair progress post-operatively - incision erythema with small dehiscence. The patient appears continues to NON COMPLIANT with Non weight bearing  regarding post-operative instruction.  Again we discuss importance of NWB and elevation.      Plan:      A discussion took place regarding post-operative progress and the redness and swelling along the incision.  I have expressed my concern regards to proximal erythema and swelling had resolved once migrating screw was removed.  Patient was instructed to call our office if they notice any significant change is post-operative progress and experience severe pain, swelling, fever/chill, calf pain, SOB.  Currently sutures are not ready to take off today.  Precaution were stressed today regards to level of activity/elevation/icing.       I express concern regards to redness and possibility of infection.  Following prescription was given:  Augmentin(500) #20 bid.        Following dressing was applied:  betadine paint was applied along the incision and sterile dressings: 4 x 4, Kerlix.      Surgical finding:  intraoperative findings were reviewed.      Ambulatory status:  The patient was instructed to continue non weight bearing using short leg cast.       Short leg cast  A well padded short leg fiberglass was applied to on right leg by Lorin PicketScott and me.  The pins were well padded and the knee  was flexed.  In addition, the foot was placed at gravity equinus to the leg.  A window was cut out for wound care.      Wound care instruction:  Apply betadine paint bid with sterile 4 x 4 gauze and cover the window.     DVT prophylaxis:  81 Aspirin bid.      Pain management:  The patient was instructed to use rest, ice, elevation, compression and NSAID as primary care for pain and swelling.  At this point we agree to use:  Tylenol.      Return/Follow-up:  follow up 1 - 2 weeks for post op check    Randa Spikeony D.H. Kim DPM, FACFAS  Reconstructive Ankle and Foot Surgeon  [facility]

## 2019-02-22 ENCOUNTER — Encounter (INDEPENDENT_AMBULATORY_CARE_PROVIDER_SITE_OTHER): Payer: Self-pay

## 2019-02-22 ENCOUNTER — Ambulatory Visit (INDEPENDENT_AMBULATORY_CARE_PROVIDER_SITE_OTHER): Payer: PPO | Admitting: Podiatrist

## 2019-02-22 DIAGNOSIS — M2021 Hallux rigidus, right foot: Secondary | ICD-10-CM

## 2019-02-22 DIAGNOSIS — S92421A Displaced fracture of distal phalanx of right great toe, initial encounter for closed fracture: Secondary | ICD-10-CM

## 2019-02-22 DIAGNOSIS — S92421D Displaced fracture of distal phalanx of right great toe, subsequent encounter for fracture with routine healing: Secondary | ICD-10-CM

## 2019-02-22 NOTE — Progress Notes (Signed)
Procedure:  Closed displaced fracture of distal phalanx of right great toe  RIGHT.  PO week #:  12 day post op, Date of surgery:  02/09/19      Ronnie Mason  is a 68 year old year old male return today for unscheduled visit.    Patient relates current issues: cast is too tight.     The patient states unchanged.    The patient states of mild pain.    The patient is NON-COMPLIANT with post-operative instructions.    Current instructed post-op weightbearing status is non weightbearing.    The patient denies of and fever/chill, SOB/CP and calf pain.    Patient denies of side effects of the prescribed medication.    Current post-op pain medication is Tylenol.    The pain is well controlled.    Current therapy:  none at present.    DVT prophylaxis:  81 Aspirin bid.

## 2019-02-22 NOTE — Progress Notes (Signed)
Ronnie Mason is here today to see Dr. Maudie Mercury.  Please see his note on today's visit.  While Ronnie Mason is here we removed his existing cast.  After removal Dr. Maudie Mercury examined the foot and redressed the incision.  He then applied new fiberglass cast in a much more neutral 90 degree position.  I then added a heel pad to these so he could at least touch his heel down as he is not learned the idea that he is not to walk on these casts.  We reinforced this numerous times.  He will return in 2 to 4 weeks with Dr. Maudie Mercury.

## 2019-02-22 NOTE — Patient Instructions (Signed)
Ambulatory status:  The patient was instructed to continue non weight bearing using short leg cast, crutches and knee scooter.       Dressing:  Apply betadine paint bid with sterile 4x4.    Short leg cast  A Short leg cast was applied by Dr. Maudie Mercury - the foot was place on 90 degree and rubber heel was place for balance and NOT FOR WALKING.     DVT prophylaxis:  81 Aspirin bid.

## 2019-02-22 NOTE — Progress Notes (Signed)
Post-Surgery Follow-Up SOAP Note    Patient: Ronnie Mason   Patient DOB: 05/07/1951     DOS:  02/22/2019     Accompanied by:  daughter    Subjective:      Procedure:ORIF Closed displaced fracture of distal phalanx of right great toe, and revision of 1st MTPJ arthrodesis, right  PO week #:12 day post op. Date of surgery:01/10/19    Ronnie GranaDavid Paul Glendening  is a 68 year old year old male return today for unscheduled visit.  He had called due cast irritation and was instructed to come in for evaluation.  atient relates current issues: cast is too tight and irritating his shin - he was instructed to come for evaluation.  The patient states unchanged.  The patient states that the cast is irritating his shin.    The patient is NON-COMPLIANT with post-operative instructions.  Current instructed post-op weightbearing status is non weightbearing.  The patient denies of and fever/chill, SOB/CP and calf pain.  Patient denies of side effects of the prescribed medication.  Current post-op pain medication is Tylenol.  The pain is well controlled.      DVT prophylaxis:  81 Aspirin bid.     PMH:   Past Medical History:   Diagnosis Date   . Carotid Sinus Hypersensitivity    . Disc disorder of lumbosacral region    . Hip injury    . HYPERTENSION     . HYPOTENSION     . Intraparenchymal hemorrhage of brain (HCC) 2017   . Pain of right hip joint 01/23/2016   . 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 Syncope    . 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(HCC)    . URI (upper respiratory infection)         Review of patient's allergies indicates:  Allergies   Allergen Reactions   . Adhesives Rash   . Bee Venom Hives, Itching and Swelling   . Gabapentin      Flu like symptoms, diarrhea, body ache upset stomach        Medications:   Outpatient Medications Prior to Visit   Medication Sig Dispense Refill   . Acetaminophen 500 MG Oral Tab 2 tablets bid     . albuterol HFA 108 (90 Base) MCG/ACT inhaler Inhale 2  puffs by mouth every 4 hours as needed for shortness of breath/wheezing. (Patient not taking: Reported on 01/13/2019) 1 Inhaler 2   . Alpha-Lipoic Acid 300 MG tablet Take 1 tablet by mouth daily.     [name]. amLODIPine 5 MG tablet Take 1 tablet (5 mg) by mouth 2 times a day. 90 tablet 3   . amoxicillin-clavulanate (Augmentin) 500-125 MG tablet Take 1 tablet (500 mg) by mouth every 12 hours. 21 tablet 0   . atorvastatin 10 MG Oral Tablet Take 1 tablet (10 mg) by mouth daily. 90 tablet 2   . BENFOTIAMINE OR Take 250 mg by mouth daily.     . Cholecalciferol (VITAMIN D3) 2000 units Oral Cap Take 1 capsule (2,000 Units) by mouth daily. For low level 1 capsule 1   . Cyanocobalamin 1000 MCG Oral Tab one per day over-the-counter started 5/21 /2012 this level borderline low 1 Tab 1   . diclofenac 1 % gel      . DULoxetine 30 MG DR capsule Take 1 capsule (30 mg) by mouth daily. 90 capsule 1   . DULoxetine 60 MG DR capsule Take 1 capsule (60 mg) by mouth  daily. 90 capsule 1   . EPINEPHrine 0.3 MG/0.3ML Injection Solution Auto-injector Inject as instructed per patient package insert, 0.3 mg intramuscularly or subcutaneously into the thigh, if needed to treat anaphylaxis 3 each 1   . Lidocaine 5 % External Patch Apply 1 patch onto the skin daily. Apply to painful area for up to 12 hours in a 24 hour period. 30 patch 10   . lidocaine 5 % patch Apply 1 patch onto the skin daily. Apply to painful area for up to 12 hours in a 24 hour period. 30 patch 0   . lisinopril 20 MG tablet Take 1 tablet (20 mg) by mouth every 12 hours. 180 tablet 3   . meloxicam 7.5 MG tablet TAKE ONE TABLET BY MOUTH EVERY MORNING (Patient not taking: Reported on 02/22/2019) 60 tablet 3   . memantine 10 MG tablet TAKE ONE TABLET BY MOUTH ONCE DAILY 30 tablet 0   . naloxone (Narcan) 4 MG/0.1ML nasal spray Use 1 spray in one nostril for suspected opioid overdose. Call 911. If unresponsive in 2 to 3 minutes, repeat with new naloxone nasal spray. 2 each 0   . oxyCODONE 5  MG tablet Take 1 tablet (5 mg) by mouth every 6 hours as needed for severe pain. 10 tablet 0   . oxyCODONE 5 MG tablet Take 1 tablet (5 mg) by mouth every 6 hours as needed for severe pain. 20 tablet 0   . oxyCODONE 5 MG tablet Take 1 tablet (5 mg) by mouth every 6 hours as needed. 20 tablet 0   . oxyCODONE 5 MG tablet Take 1 tablet (5 mg) by mouth every 6 hours as needed for severe pain. 20 tablet 0   . oxyCODONE 5 MG tablet Take 1 tablet (5 mg) by mouth every 6 hours as needed for severe pain. 30 tablet 0   . pregabalin 100 MG capsule Take 1 capsule (100 mg) by mouth 2 times a day. 180 capsule 1   . pregabalin 50 MG capsule 1 po bid 180 capsule 1   . primidone 50 MG tablet Take 1 tablet (50 mg) by mouth 2 times a day. 180 tablet 1   . solifenacin 5 MG tablet Take 1 tablet (5 mg) by mouth daily. 30 tablet 11   . thiamine 100 MG tablet Take 1 tablet by mouth daily.      [name]. triamcinolone 0.1 % cream As needed to right ear bid x 1 week 1 Tube 0     No facility-administered medications prior to visit.         Objective:   There were no vitals filed for this visit.     Cast condition:  The cast was placed in equinus setting to prevent him from walking.  The cast is broke down on plantar forefoot and and removed without complication.  Placing weight on forefoot caused pressure on the shin.  There is no pressure wound/sore/irritation noted on the leg/shin.      The surgical sites were examined and the incision(s) has NOT change from previous exam.  The dorsal incision has erythema distally at MTPJ(likely related to hematoma) and partial dehisced (<455mm) at 1st MTPJ fusion site.  The redness on the parameter appears to be less red.  There is no proximal proximal lymphangitis/cellulitis.  There is moderate pain on incision.  There is mild edema.  There is no signs of DVT i.e. no calf pain, swelling and redness.  Clinically, the post-operative alignments is good.  There is no evidence of allodynia.  Signs of hyperalgesia is  absent.  There is no motor inhibition. The skin color is Normal for ethnicity.  The skin temperature is Warm.    The two wire pins are stable and intact.  There is no signs of infection.        Lab:  WBC 8.43 and CRP:  17.4    Assessment:   Post op 12 days to address (B63.893T) Closed displaced fracture of distal phalanx of right great toe, initial encounter  (primary encounter diagnosis)  (M20.21) Hallux rigidus, right foot    The patient is making set back in progress post-operatively and concern of the redness and discuss regards to possible bone infection.   The patient appears to be NON COMPLIANT regarding post-operative instruction.    Plan:    A discussion took place regarding post-operative progress.  Patient was instructed to call our office if they notice any significant change is post-operative progress and experience severe pain, swelling, fever/chill, calf pain, SOB.  betadine paint was applied along the incision.  Precaution were stressed today regards to level of activity/elevation/icing.      Ambulatory status:  The patient was instructed to continue non weight bearing using short leg cast, crutches and knee scooter.       Dressing:  Apply betadine paint bid with sterile 4x4.    Short leg cast  A Short leg cast was applied by Dr. Maudie Mercury - the foot was place on 90 degree and rubber heel was place for balance and NOT FOR WALKING.     DVT prophylaxis:  81 Aspirin bid.     Pain management:  The patient was instructed to use rest, ice, elevation, compression and NSAID as primary care for pain and swelling.  At this point we agree to use:  Tylenol.     Return/Follow-up:  follow up in 1-2 weeks    Vira Browns DPM, FACFAS  Reconstructive Ankle and Foot Surgeon  [facility]

## 2019-03-01 ENCOUNTER — Ambulatory Visit (INDEPENDENT_AMBULATORY_CARE_PROVIDER_SITE_OTHER): Payer: PPO | Admitting: Podiatrist

## 2019-03-01 DIAGNOSIS — M2021 Hallux rigidus, right foot: Secondary | ICD-10-CM

## 2019-03-01 DIAGNOSIS — S92421D Displaced fracture of distal phalanx of right great toe, subsequent encounter for fracture with routine healing: Secondary | ICD-10-CM

## 2019-03-01 DIAGNOSIS — S92421A Displaced fracture of distal phalanx of right great toe, initial encounter for closed fracture: Secondary | ICD-10-CM

## 2019-03-01 MED ORDER — AMOXICILLIN-POT CLAVULANATE 500-125 MG OR TABS
500.0000 mg | ORAL_TABLET | Freq: Two times a day (BID) | ORAL | 0 refills | Status: DC
Start: 2019-03-01 — End: 2019-12-02

## 2019-03-01 NOTE — Patient Instructions (Signed)
Wound/incision care:  Continue to apply betadine paint bid.     Ambulatory status:  The patient was instructed to continue non weight bearing using short leg cast and knee scooter.       DVT prophylaxis:  81 Aspirin bid.

## 2019-03-01 NOTE — Progress Notes (Signed)
Post-Surgery Follow-Up SOAP Note    Patient: Ronnie Mason   Patient DOB: 03/30/1951     DOS:  03/01/2019     Accompanied by:  patient was unaccompanied    Subjective:    Procedure:  ORIFClosed displaced fracture of distal phalanx of right great toe,and revision of 1st MTPJ arthrodesis, right  RIGHT.  PO week #:  3 week post op, Date of surgery:  02/09/19.      Ronnie Mason is a 68 year old year old male return today for routine post operative and incision check.  The patient states no major issue.  The patient states of mild and moderate pain.  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  The patient is doing best he can staying off it.  Current instructed post-op weightbearing status is non weightbearing.  The patient denies of and fever/chill, SOB/CP and calf pain.  Patient denies of side effects of the prescribed medication.  Current post-op pain medication is Tylenol and aspirin.  Current therapy:  nothing right now.  DVT prophylaxis:  81 Aspirin bid.  Wound care:  His wife is applying betadine paint with new dressing bid.      PMH:   Past Medical History:   Diagnosis Date   . Carotid Sinus Hypersensitivity    . Disc disorder of lumbosacral region    . Hip injury    . HYPERTENSION     . HYPOTENSION     . Intraparenchymal hemorrhage of brain ([place]) 2017   . Pain of right hip joint 01/23/2016   . 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 Syncope    . 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([place])    . URI (upper respiratory infection)         Review of patient's allergies indicates:  Allergies   Allergen Reactions   . Adhesives Rash   . Bee Venom Hives, Itching and Swelling   . Gabapentin      Flu like symptoms, diarrhea, body ache upset stomach        Medications:   Outpatient Medications Prior to Visit   Medication Sig Dispense Refill   . Acetaminophen 500 MG Oral Tab 2 tablets bid     . albuterol HFA 108 (90 Base) MCG/ACT inhaler Inhale 2 puffs by mouth every 4 hours as needed for  shortness of breath/wheezing. (Patient not taking: Reported on 01/13/2019) 1 Inhaler 2   . Alpha-Lipoic Acid 300 MG tablet Take 1 tablet by mouth daily.     [name] amLODIPine 5 MG tablet Take 1 tablet (5 mg) by mouth 2 times a day. 90 tablet 3   . amoxicillin-clavulanate (Augmentin) 500-125 MG tablet Take 1 tablet (500 mg) by mouth every 12 hours. 21 tablet 0   . atorvastatin 10 MG Oral Tablet Take 1 tablet (10 mg) by mouth daily. 90 tablet 2   . BENFOTIAMINE OR Take 250 mg by mouth daily.     . Cholecalciferol (VITAMIN D3) 2000 units Oral Cap Take 1 capsule (2,000 Units) by mouth daily. For low level 1 capsule 1   . Cyanocobalamin 1000 MCG Oral Tab one per day over-the-counter started 5/21 /2012 this level borderline low 1 Tab 1   . diclofenac 1 % gel      . DULoxetine 30 MG DR capsule Take 1 capsule (30 mg) by mouth daily. 90 capsule 1   . DULoxetine 60 MG DR capsule Take 1 capsule (60 mg) by mouth daily. 90 capsule 1   .  EPINEPHrine 0.3 MG/0.3ML Injection Solution Auto-injector Inject as instructed per patient package insert, 0.3 mg intramuscularly or subcutaneously into the thigh, if needed to treat anaphylaxis 3 each 1   . Lidocaine 5 % External Patch Apply 1 patch onto the skin daily. Apply to painful area for up to 12 hours in a 24 hour period. 30 patch 10   . lidocaine 5 % patch Apply 1 patch onto the skin daily. Apply to painful area for up to 12 hours in a 24 hour period. 30 patch 0   . lisinopril 20 MG tablet Take 1 tablet (20 mg) by mouth every 12 hours. 180 tablet 3   . meloxicam 7.5 MG tablet TAKE ONE TABLET BY MOUTH EVERY MORNING (Patient not taking: Reported on 02/22/2019) 60 tablet 3   . memantine 10 MG tablet TAKE ONE TABLET BY MOUTH ONCE DAILY 30 tablet 0   . naloxone (Narcan) 4 MG/0.1ML nasal spray Use 1 spray in one nostril for suspected opioid overdose. Call 911. If unresponsive in 2 to 3 minutes, repeat with new naloxone nasal spray. 2 each 0   . oxyCODONE 5 MG tablet Take 1 tablet (5 mg) by mouth  every 6 hours as needed for severe pain. 10 tablet 0   . oxyCODONE 5 MG tablet Take 1 tablet (5 mg) by mouth every 6 hours as needed for severe pain. 20 tablet 0   . oxyCODONE 5 MG tablet Take 1 tablet (5 mg) by mouth every 6 hours as needed. 20 tablet 0   . oxyCODONE 5 MG tablet Take 1 tablet (5 mg) by mouth every 6 hours as needed for severe pain. 20 tablet 0   . oxyCODONE 5 MG tablet Take 1 tablet (5 mg) by mouth every 6 hours as needed for severe pain. 30 tablet 0   . pregabalin 100 MG capsule Take 1 capsule (100 mg) by mouth 2 times a day. 180 capsule 1   . pregabalin 50 MG capsule 1 po bid 180 capsule 1   . primidone 50 MG tablet Take 1 tablet (50 mg) by mouth 2 times a day. 180 tablet 1   . solifenacin 5 MG tablet Take 1 tablet (5 mg) by mouth daily. 30 tablet 11   . thiamine 100 MG tablet Take 1 tablet by mouth daily.      [name]. triamcinolone 0.1 % cream As needed to right ear bid x 1 week 1 Tube 0     No facility-administered medications prior to visit.         Objective:   There were no vitals filed for this visit.     Cast condition:  The cast is intact and in good condition  .    The surgical sites were examined and the incision(s) has improved - redness is resolving.  The partial dehisced (<485mm) at 1st MTPJ fusion site is closing in due to reduction of swelling.  There is mild pain on surgical site.  The two wire pins are stable and intact. There is no signs of infection.     DIAGNOSTIC IMAGING:    Right:  AP/LAT/MO foot (CPT- 04540- 73630) views     Finding:   The 1st metatarso-phalangeal joint and hallux proximal phalanx  fusion and fracture  is/are well opposed/approximated. The fixation is solid and good position. The osseous alignment is good.  There is no evidence of osteolysis or emphysema noted.    Assessment:   Post op 3 weeks to address (J81.191Y(S92.421A) Closed displaced  fracture of distal phalanx of right great toe, initial encounter  (primary encounter diagnosis)  (M20.21) Hallux rigidus, right foot      The patient is making steady progress post-operatively.     Plan:    A discussion took place regarding post-operative progress.  Patient was instructed to call our office if they notice any significant change is post-operative progress and experience severe pain, swelling, fever/chill, calf pain, SOB.  betadine paint was applied along the incision and continue at home.  Precaution were stressed today regards to level of activity/elevation/icing.      There is no signs of osteomyelitis but still with the revision surgery and wound dehiscence, we decided to complete 6 weeks regimen of oral abx.      Following prescription was given:  Augmentin(500) #20 bid and The antibiotic prescription was given for precautionary measure.        Ambulatory status:  The patient was instructed to continue non weight bearing using short leg cast and knee scooter.       DVT prophylaxis:  81 Aspirin bid.     Pain management:  The patient was instructed to use rest, ice, elevation, compression and NSAID as primary care for pain and swelling.  At this point we agree to use:  Tylenol.     Return/Follow-up:  follow up 3 - 4 weeks for post op check and x-ray AP/LAT/ MO views foot and cast removal.     Randa Spikeony D.H. Kim DPM, FACFAS  Reconstructive Ankle and Foot Surgeon  [facility]

## 2019-03-01 NOTE — Progress Notes (Signed)
Procedure:  ORIF Closed displaced fracture of distal phalanx of right great toe, and revision of 1st MTPJ arthrodesis, right  RIGHT.  PO week #:  3 week post op, Date of surgery:  02/09/19.      Demarquez Ciolek is a 69 year old year old male return today for routine post operative check.  Patient relates current issues: checking on cast and incision.   The patient states no major issue.  The patient states of mild and moderate pain.  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  The patient is doing best he can staying off it.  Current instructed post-op weightbearing status is non weightbearing.  The patient denies of and fever/chill, SOB/CP and calf pain.  Patient denies of side effects of the prescribed medication.  Current post-op pain medication is Tylenol and aspirin.  Current therapy:  nothing right now.  DVT prophylaxis:  81 Aspirin bid.  Wound care:  His wife is applying betadine paint with new dressing bid.

## 2019-03-02 ENCOUNTER — Ambulatory Visit: Payer: PPO | Attending: Anesthesiology | Admitting: Anesthesiology

## 2019-03-02 ENCOUNTER — Encounter (HOSPITAL_BASED_OUTPATIENT_CLINIC_OR_DEPARTMENT_OTHER): Payer: Self-pay | Admitting: Anesthesiology

## 2019-03-02 DIAGNOSIS — G629 Polyneuropathy, unspecified: Secondary | ICD-10-CM | POA: Insufficient documentation

## 2019-03-02 MED ORDER — MEMANTINE HCL 10 MG OR TABS
10.0000 mg | ORAL_TABLET | Freq: Every day | ORAL | 0 refills | Status: DC
Start: 2019-03-02 — End: 2019-03-30

## 2019-03-02 NOTE — Progress Notes (Signed)
Review of Systems   Constitutional: Negative.    HENT: Negative.    Eyes: Negative.    Respiratory: Negative.    Cardiovascular: Negative.    Gastrointestinal: Negative.    Endocrine: Negative.    Genitourinary: Negative.    Musculoskeletal: Positive for arthralgias, back pain and gait problem.   Skin: Negative.    Allergic/Immunologic: Negative.    Neurological: Positive for weakness and numbness.   Hematological: Negative.    Psychiatric/Behavioral: Negative.

## 2019-03-02 NOTE — Patient Instructions (Signed)
Talk to your PCP about increasing Lyrica

## 2019-03-02 NOTE — Progress Notes (Signed)
Jakaleb Payer;  MRN: [id number];   DOB: [birth year]    CHIEF COMPLAINT:   Right foot and ankle pain    HISTORY OF PRESENT ILLNESS:  The patient presents to today for interval reevaluation of right ankle pain. The patient has a pain history significant for lower back and right leg pain.  I last spoke with the patient on Dec 29, 2018 via telehealth.  At that visit we discussed continuing pregabalin, duloxetine, meloxicam, acetaminophen.  We also increased his dose of memantine.  At that time the patient has since undergone right foot 1 MTP arthrodesis on 01/12/19.  Unfortunately approximately 2 weeks after his surgery the patient did trip causing further injury to his foot and ankle which required further surgical repair.  Since that time he has been healing appropriately.  Office note from February 22, 2019 from Dr. Maudie Mercury notes concerns for noncompliance regarding postoperative instruction.  The patient's wife endorses that the patient may not have been fully compliant with weightbearing status.  At today's visit attempts to weight-bear on the extremity were noted as well.  minoph  Today the patient's pain is rated as 4/10 in severity on the verbal numeric rating scale.  His primary pain is located in the right foot and ankle and is described as sharp and shooting.  The pain is occasional and brief when it occurs.  It does awaken him at night.  The patient believes that the pain is been consistently present since his last surgery.  He is concerned that it may take up to a year for his pain to resolve.  In addition to his right lower extremity pain he also endorses bilateral back pain primarily myofascial in nature secondary to having limb length discrepancy as well as using wheeled scooter.  The patient continues to use pregabalin 150 mg twice a day, duloxetine 90 mg, and memantine 10 mg daily.  He believes he is having some benefit from pregabalin and duloxetine though is unclear whether he is having good benefit from  amantadine as of yet.    Other than that as stated above the patient denies any changes in past medical history, medications, or allergies since the last office visit.    REVIEW OF SYSTEMS:  The review of systems documented by our clinic staff was provided by the patient. I reviewed and confirmed these details with [name] and have no further detail to add.    PHYSICAL EXAMINATION:  BP 107/77   Pulse (!) 102   Wt (!) 228 lb (103.4 kg)   BMI 28.12 kg/m    Constitutional: He is Constitutional: well-appearing, well-groomed, in no distress, well-nourished and cooperative and exhibits no pain behavior  Skin, exposed parts: color normal. Right toes appear warm and well perfused  Eyes: Negative for miosis bilaterally. Pupils appropriate for room lighting. Extraocular movements are intact bilaterally  Neurologic: Able to wiggle right toes.  Sensation grossly intact in lower extremities bilaterally.  Strength 5 out of 5 in bilateral proximal lower extremities.  Strength at least antigravity in bilateral upper extremities.  Musculoskeletal: Right ankle in cast.  Right toes are warm and well-perfused.  Able to ambulate with use of wheeled scooter. Significant hypertonicity of bilateral trapezius, thoracic, and lumbar paraspinous muscles  Psychiatric: Judgement/insight are good. Mood is good. Affect is normal. Patient is oriented to person, place and time    IMPRESSION:   Schawn Byas is a 68 year old male with   1.Axial low back pain concerning for facet arthropathy  at L5-S1 with prior positive response to RFA when including L4-L5 as well.  2. R leg/foot pain laterally in settingmallet toe and peroneal brevis and longus tear diagnosed by podiatry- now s/p R foot 1 MTP arthrodesis  01/12/19.  3. Prior treatment failures including: None  4. Medical comorbidities significant formultiple pain syndromes (neck pain, shoulder pain previously, groin/hip pain, prior greater trochanteric pain syndrome, R foot fracture, R  lateral malleolus fracture).    PLAN:    My impressions and treatment recommendations were discussed in detail with the patient who had no further questions before leaving the office today.     I adamantly reinforced with the patient the importance of maintaining his nonweightbearing status as this will affect the overall healing of his foot and ankle. I encouraged him to discuss with his physical therapist more strategies for tranisitioning     Given ongoing nerve related complaints in the right foot and ankle I believe it is reasonable to increase his pregabalin to a goal dose of 150 mg 3 times a day or 450 mg per 24 hours.  The patient should follow-up with his prescribing provider, Dr. Marisa SprinklesKraft, in order to increase this medication.  It may be prudent to increase by 50 mg every several days from his present dose until goal dose is reached.    I encouraged the patient to continue using acetaminophen as needed for pain.  Currently he is using 4 g or less per 24 hours.  More commonly the patient is using 3 g or less.    As for the patient's back pain, and encouraged him to increase his use of the TENS unit throughout his back.  He does have significant muscular spasms at this time.  I will plan to see the patient back for follow-up in approximately 1 month after he has had his cast removed.  At that time we will determine whether it is appropriate to consider trigger point injections for his muscular spasms versus a short course of muscle relaxants.  At that visit we will also evaluate whether to increase dose of memantine or discontinue this medication. One month refill of memantine was provided today.

## 2019-03-09 ENCOUNTER — Telehealth (INDEPENDENT_AMBULATORY_CARE_PROVIDER_SITE_OTHER): Payer: Self-pay | Admitting: Podiatrist

## 2019-03-09 NOTE — Telephone Encounter (Signed)
Pt concerned about infection in toe and wants to discuss getting new cast.  appt made

## 2019-03-09 NOTE — Telephone Encounter (Signed)
General Message:    Detailed Message: Pt requests a call from Dr. Julianne Rice nurse pls.  Return Call: General message okay

## 2019-03-10 ENCOUNTER — Ambulatory Visit (INDEPENDENT_AMBULATORY_CARE_PROVIDER_SITE_OTHER): Payer: PPO | Admitting: Podiatrist

## 2019-03-10 ENCOUNTER — Telehealth (INDEPENDENT_AMBULATORY_CARE_PROVIDER_SITE_OTHER): Payer: Self-pay | Admitting: Internal Medicine

## 2019-03-10 DIAGNOSIS — S92421A Displaced fracture of distal phalanx of right great toe, initial encounter for closed fracture: Secondary | ICD-10-CM

## 2019-03-10 DIAGNOSIS — S92421D Displaced fracture of distal phalanx of right great toe, subsequent encounter for fracture with routine healing: Secondary | ICD-10-CM

## 2019-03-10 DIAGNOSIS — M2021 Hallux rigidus, right foot: Secondary | ICD-10-CM

## 2019-03-10 NOTE — Telephone Encounter (Signed)
I spoke with the patient and scheduled a Telemedicine visit.

## 2019-03-10 NOTE — Patient Instructions (Signed)
Ambulatory status:  The patient was instructed to continue non weight bearing using crutches, walker and knee scooter.

## 2019-03-10 NOTE — Telephone Encounter (Signed)
Tele med next wk

## 2019-03-10 NOTE — Progress Notes (Signed)
Post-Surgery Follow-Up SOAP Note    Patient: Ronnie Mason   Patient DOB: 18-Oct-1950     DOS:  03/10/2019     Accompanied by:  daughter    Subjective:  Procedure:ORIFClosed displaced fracture of distal phalanx of right great toe,and revision of 1st MTPJ arthrodesis, rightRIGHT. PO week #:4 week post op,Date of surgery:02/09/19.    Ronnie Mason is a 68 year old year oldmalereturn today for unscheduled post operativeand incisioncheck. The patient stated when called for appt that cast needed changed and top of foot hurt from where betadine wasgetting in. But on inspection at visit, cast is intact, and he expresses itching top of foot, which can be normal with wearing a cast and healing from surgery. He wants to check for infection and pin check as it rubs in cast.  He has been moving foot in the boot a lot and pressing the pin medially to the cast and he concerned it is getting pushed in further. He also feels his toe/foot wants to come out of cast. The patient states of mild and moderatepain. Mostly from neurapathy issues.The patient isdoing best he can staying off it.Current instructed post-op weightbearing status isnon weightbearing. The patient denies of and fever/chill, SOB/CP and calf pain. Patient denies of side effects of the prescribed medication. Current post-op pain medication is Tylenol and aspirin. Current therapy: nothing right now. DVT prophylaxis: 81 Aspirin bid.Wound care: His wife is applying betadine paint with new dressing bid.His daughter has states multiple time that he is not compliment for post-op instruction.     PMH:   Past Medical History:   Diagnosis Date   . Carotid Sinus Hypersensitivity    . Disc disorder of lumbosacral region    . Hip injury    . HYPERTENSION     . HYPOTENSION     . 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 Pain of right hip joint 01/23/2016   . 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 myelopathy or  radiculopathy 05/28/2014   . Syncope    . 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([place])    . URI (upper respiratory infection)         Review of patient's allergies indicates:  Allergies   Allergen Reactions   . Adhesives Rash   . Bee Venom Hives, Itching and Swelling   . Gabapentin      Flu like symptoms, diarrhea, body ache upset stomach        Medications:   Outpatient Medications Prior to Visit   Medication Sig Dispense Refill   . Acetaminophen 500 MG Oral Tab 2 tablets bid     . albuterol HFA 108 (90 Base) MCG/ACT inhaler Inhale 2 puffs by mouth every 4 hours as needed for shortness of breath/wheezing. (Patient not taking: Reported on 01/13/2019) 1 Inhaler 2   . Alpha-Lipoic Acid 300 MG tablet Take 1 tablet by mouth daily.     [name] amLODIPine 5 MG tablet Take 1 tablet (5 mg) by mouth 2 times a day. 90 tablet 3   . amoxicillin-clavulanate (Augmentin) 500-125 MG tablet Take 1 tablet (500 mg) by mouth every 12 hours. 20 tablet 0   . amoxicillin-clavulanate (Augmentin) 500-125 MG tablet Take 1 tablet (500 mg) by mouth every 12 hours. 21 tablet 0   . atorvastatin 10 MG Oral Tablet Take 1 tablet (10 mg) by mouth daily. 90 tablet 2   . BENFOTIAMINE OR Take 250 mg by mouth daily.     . Cholecalciferol (VITAMIN D3) 2000  units Oral Cap Take 1 capsule (2,000 Units) by mouth daily. For low level 1 capsule 1   . Cyanocobalamin 1000 MCG Oral Tab one per day over-the-counter started 5/21 /2012 this level borderline low 1 Tab 1   . diclofenac 1 % gel      . DULoxetine 30 MG DR capsule Take 1 capsule (30 mg) by mouth daily. 90 capsule 1   . DULoxetine 60 MG DR capsule Take 1 capsule (60 mg) by mouth daily. 90 capsule 1   . EPINEPHrine 0.3 MG/0.3ML Injection Solution Auto-injector Inject as instructed per patient package insert, 0.3 mg intramuscularly or subcutaneously into the thigh, if needed to treat anaphylaxis 3 each 1   . Lidocaine 5 % External Patch Apply 1 patch onto the skin daily. Apply to painful area for up to 12 hours in a 24 hour  period. 30 patch 10   . lidocaine 5 % patch Apply 1 patch onto the skin daily. Apply to painful area for up to 12 hours in a 24 hour period. 30 patch 0   . lisinopril 20 MG tablet Take 1 tablet (20 mg) by mouth every 12 hours. 180 tablet 3   . meloxicam 7.5 MG tablet TAKE ONE TABLET BY MOUTH EVERY MORNING (Patient not taking: Reported on 02/22/2019) 60 tablet 3   . memantine 10 MG tablet Take 1 tablet (10 mg) by mouth daily. 30 tablet 0   . naloxone (Narcan) 4 MG/0.1ML nasal spray Use 1 spray in one nostril for suspected opioid overdose. Call 911. If unresponsive in 2 to 3 minutes, repeat with new naloxone nasal spray. 2 each 0   . oxyCODONE 5 MG tablet Take 1 tablet (5 mg) by mouth every 6 hours as needed for severe pain. 10 tablet 0   . oxyCODONE 5 MG tablet Take 1 tablet (5 mg) by mouth every 6 hours as needed for severe pain. 20 tablet 0   . oxyCODONE 5 MG tablet Take 1 tablet (5 mg) by mouth every 6 hours as needed. (Patient not taking: Reported on 03/02/2019) 20 tablet 0   . oxyCODONE 5 MG tablet Take 1 tablet (5 mg) by mouth every 6 hours as needed for severe pain. (Patient not taking: Reported on 03/02/2019) 20 tablet 0   . oxyCODONE 5 MG tablet Take 1 tablet (5 mg) by mouth every 6 hours as needed for severe pain. 30 tablet 0   . pregabalin 100 MG capsule Take 1 capsule (100 mg) by mouth 2 times a day. 180 capsule 1   . pregabalin 50 MG capsule 1 po bid 180 capsule 1   . primidone 50 MG tablet Take 1 tablet (50 mg) by mouth 2 times a day. 180 tablet 1   . solifenacin 5 MG tablet Take 1 tablet (5 mg) by mouth daily. 30 tablet 11   . thiamine 100 MG tablet Take 1 tablet by mouth daily.      [name] triamcinolone 0.1 % cream As needed to right ear bid x 1 week 1 Tube 0     No facility-administered medications prior to visit.         Objective:   There were no vitals filed for this visit.   Cast condition:  The cast is intact and the stockinet is stained with betadine.  The surgical sites were examined and the  incision(s) has/have healing with only two 2 mm spot of dehiscence with granulation tissue.   The K-wire on the tip of hallux  is stable intact without irritation and infection.  On the medial, the K-wire is slightly pressed in with irritation but no signs of infection.  There is mild pain on surgical site.  There is mark reduction of edema and erythema has resolved.   There is no signs of DVT i.e. no calf pain, swelling and redness.     Assessment:   Post op 3 weeks to address (Z61.096E) Closed displaced fracture of distal phalanx of right great toe, initial encounter  (primary encounter diagnosis)  (M20.21) Hallux rigidus, right foot    The patient is making fair progress post-operatively. The patient appears to be NON COMPLIANT regarding post-operative instruction.    Plan:    A discussion took place regarding post-operative progress.  Patient was instructed to call our office if they notice any significant change is post-operative progress and experience severe pain, swelling, fever/chill, calf pain, SOB.  Using sterile suture kit, sutures were removed without complication and Antibiotic ointment with 4 x 4.  Precaution were stressed today regards to level of activity/elevation/icing.      Since the cast is intact, we decided to widen the window of the cast where the medial side of the window on the cast was removed.  A 1/8" felt was apply on the plantar the forefoot.  I have removed all sutures using sterile instrument.  An antibiotic ointment was apply and 4 X 4 gauze which they need to continue bid.    We decided to keep window of the cast open and just apply ace bandage on the forefoot.     Ambulatory status:  The patient was instructed to continue non weight bearing using crutches, walker and knee scooter.       DVT prophylaxis:  81 Aspirin bid.     Pain management:  The patient was instructed to use rest, ice, elevation, compression and NSAID as primary care for pain and swelling.  At this point we agree to  use:  Tylenol.     Return/Follow-up:  as schedule - K-wire removal.     Vira Browns DPM, FACFAS  Reconstructive Ankle and Foot Surgeon  [facility]

## 2019-03-10 NOTE — Telephone Encounter (Signed)
Patient is asking if VK will take  look at notes from patients visit with Dr. Lisbeth Ply?  Dr. Lisbeth Ply would like patients to increased the Lyrica from 300 to 450 mg daily patient said that VK prescribes this medication for patient.  Please advise patient

## 2019-03-10 NOTE — Progress Notes (Signed)
Post-Surgery Follow-Up SOAP Note    Patient: Ronnie Mason   Patient DOB: [birth year]     DOS:  03/10/2019     Accompanied by:  daughter    Subjective:    Procedure:ORIFClosed displaced fracture of distal phalanx of right great toe,and revision of 1st MTPJ arthrodesis, rightRIGHT. PO week #:4 week post op,Date of surgery:02/09/19.    [name] is a 68 year old year oldmalereturn today for unscheduled post operative and incision check. The patient stated when called for appt that cast needed changed and top of foot hurt from where betadine wasgetting in. But on inspection at visit, cast is intact, and he expresses itching top of foot, which can be normal with wearing a cast and healing from surgery. He wants to check for infection and pin check as it rubs in cast.  He has been moving foot in the boot a lot and pressing the pin medially to the cast and he concerned it is getting pushed in further. He also feels his toe/foot wants to come out of cast. The patient states of mild and moderatepain. Mostly from neurapathy issues.The patient isdoing best he can staying off it.Current instructed post-op weightbearing status isnon weightbearing. The patient denies of and fever/chill, SOB/CP and calf pain. Patient denies of side effects of the prescribed medication. Current post-op pain medication is Tylenol and aspirin. Current therapy: nothing right now. DVT prophylaxis: 81 Aspirin bid.Wound care: His wife is applying betadine paint with new dressing bid.

## 2019-03-16 ENCOUNTER — Telehealth (INDEPENDENT_AMBULATORY_CARE_PROVIDER_SITE_OTHER): Payer: PPO | Admitting: Internal Medicine

## 2019-03-16 ENCOUNTER — Telehealth (INDEPENDENT_AMBULATORY_CARE_PROVIDER_SITE_OTHER): Payer: Self-pay | Admitting: Internal Medicine

## 2019-03-16 ENCOUNTER — Encounter (INDEPENDENT_AMBULATORY_CARE_PROVIDER_SITE_OTHER): Payer: Self-pay | Admitting: Internal Medicine

## 2019-03-16 VITALS — Ht 75.5 in | Wt 225.0 lb

## 2019-03-16 DIAGNOSIS — G609 Hereditary and idiopathic neuropathy, unspecified: Secondary | ICD-10-CM

## 2019-03-16 DIAGNOSIS — F1021 Alcohol dependence, in remission: Secondary | ICD-10-CM

## 2019-03-16 MED ORDER — PREGABALIN 50 MG OR CAPS
ORAL_CAPSULE | ORAL | 1 refills | Status: DC
Start: 2019-03-16 — End: 2019-05-23

## 2019-03-16 MED ORDER — PREGABALIN 100 MG OR CAPS
100.0000 mg | ORAL_CAPSULE | Freq: Two times a day (BID) | ORAL | 1 refills | Status: DC
Start: 2019-03-16 — End: 2019-05-23

## 2019-03-16 NOTE — Progress Notes (Signed)
[facility]     Is this visit being conducted using   Telemedicine (live, interactive video and audio)     [x]  Distant Site Telemedicine Encounter  [facility] secure, live, face-to-face video conference with the patient.   Prior to the interview, the risks and benefits of telemedicine were discussed with the patient and verbal consent was obtained.    Insurance to be billed, copay may apply     Ronnie Mason   68 year old  located at home  8:57 AM  I spent a total time of 55 minutes face-to-face with the patient, of which more than 50% was spent counseling and coordinating care as outlined in this note.    9:22 AM          Chief Complaint   Patient presents with   . Medication Review     discuss Lyrica       Dr Allyn Kennerpeperzac want to inc to the med from   300 pregabalin 150 bid   Goal to 150 tid or 450 per day total      50 mg every several days from his present dose until goal dose is reached.      Part of body in pain   Foot - has had 3 surgeries, now in cast- feels good and sore - in 2 wks will have cast off - currently 4 when dont walk   Lower back and the foot worsening the back 4 not walking     Hurts toe  Last night slept on toe so when up it hurts   He thinks laying     He said it has been bleeding   Since 7/22    He said there is stuff poking out     REVIEW OF SYSTEMS -   Not reviewed if not marked   P=Positive  N=Negative      P   N  []  []    []  [x]  Constitutional:        No Fever       Physical Exam:      General:   [x]  Alert []  Lethargic   [x]  Well-appearing []  Ill-appearing   [x]  In no acute distress []  In acute distress     Respiratory:  [x]  Speaking in full sentences comfortably []  Unable to speak in full sentences comfortably   [x]  Normal work of breathing []  Increased work of breathing   [x]  No cough during visit []  Coughing during visit     []   I can see the foot on exam   []    []      Assessment /Plan  (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    Diagnoses and all orders for this visit:    Alcoholism in remission (HCC)    Idiopathic peripheral neuropathy  -     pregabalin 50 MG capsule; 1 po bid and start 50 mg at lunch per day for one week and inc by 50 mg weekly until 150 at lunch as tolerated  -     pregabalin 100 MG capsule; Take 1 capsule (100 mg) by mouth 2 times a day.          I suspect will have to stay off the foot even if cast off   Will follow this up W Dr Selena BattenKim  Patient will make  sure he totally understand the instruction       Patient reporting open wound to toe   Since 7/22   I am concerned with this   To contact Dr Maudie Mercury     See note in chart from patient call last wk    Currently patient report no smell to wound or pus   But I am concerned he said liquid x 2 spots draining     Patient To call Dr Noel Journey office   Ronnie Adie, MD    Has been off alcoholol   He said [facility]  To call [facility]   Flu shot when available     Cc Dr Maudie Mercury   See me telemed after mid September

## 2019-03-16 NOTE — Telephone Encounter (Signed)
He said shingrix at [employer] records [employer]

## 2019-03-16 NOTE — Telephone Encounter (Signed)
Sent fax requesting immunization list to [employer] at F: [phone].

## 2019-03-17 ENCOUNTER — Encounter (INDEPENDENT_AMBULATORY_CARE_PROVIDER_SITE_OTHER): Payer: Self-pay

## 2019-03-17 ENCOUNTER — Ambulatory Visit (INDEPENDENT_AMBULATORY_CARE_PROVIDER_SITE_OTHER): Payer: PPO | Admitting: Podiatrist

## 2019-03-17 VITALS — BP 131/91 | HR 93 | Temp 96.7°F

## 2019-03-17 DIAGNOSIS — M2021 Hallux rigidus, right foot: Secondary | ICD-10-CM

## 2019-03-17 DIAGNOSIS — S92421A Displaced fracture of distal phalanx of right great toe, initial encounter for closed fracture: Secondary | ICD-10-CM

## 2019-03-17 DIAGNOSIS — S92421D Displaced fracture of distal phalanx of right great toe, subsequent encounter for fracture with routine healing: Secondary | ICD-10-CM

## 2019-03-17 NOTE — Progress Notes (Signed)
Post-Surgery Follow-Up SOAP Note    Patient: Romelo Sciandra   Patient DOB: 1951/02/22     DOS:  03/17/2019     Accompanied by:  wife    Subjective:  Procedure:ORIFClosed displaced fracture of distal phalanx of right great toe,and revision of 1st MTPJ arthrodesis, rightRIGHT. PO week #:5week post op,Date of surgery:02/09/19.    Zahari Xiang is a 68 year old year oldmalereturn today forunscheduledpost operativeand incisioncheck. The patient stated saw primary yesterday and she didn't like how the incision looked, it was telemedicne visit also. Marland KitchenHe wantstocheck for infection and pin check as it rubs in cast. He has been moving foot in the boot a lot. The patient states of mild and moderatepain. Mostly from neurapathy issues.The patient isdoing best he can staying off it.Current instructed post-op weightbearing status isnon weightbearing. The patient denies of and fever/chill, SOB/CP and calf pain. Patient denies of side effects of the prescribed medication. Current post-op pain medication is Tylenol and aspirin. Current therapy: nothing right now. DVT prophylaxis: 81 Aspirin bid.Wound care: His wife is applying betadine paint with new dressing bid.He has pictures from yesterday.

## 2019-03-17 NOTE — Progress Notes (Signed)
Post-Surgery Follow-Up SOAP Note    Patient: Ronnie GranaDavid Paul Mason   Patient DOB: 07/08/1951     DOS:  03/17/2019     Accompanied by:  Spouse    Subjective:    Procedure:ORIFClosed displaced fracture of distal phalanx of right great toe and revision of 1st MTPJ arthrodesis due to non-compliance of NWB, rightRIGHT. PO week #:5week post op,Date of surgery:02/09/19 - revision and ORIF surgery.    Ronnie GranaDavid Paul Mason is a 68 year old year oldmalereturn today forunscheduledpost operativeand incisioncheck. We had multiple set back due to his non compliance.  Currently it is the partial open wound along the incision.  The patient stated he had telemedicine meeting with primary yesterday and she didn't like how the incision looked and instructed to see us.  He states that the open wound is healingwell but the medial pin rubs in cast.  The patient states of mild and moderatepain - mostly from neurapathy issues.The patient isdoing best he can staying off it.Current instructed post-op weightbearing status isnon weightbearing. The patient denies of and fever/chill, SOB/CP and calf pain. Patient denies of side effects of the prescribed medication. Current post-op pain medication is Tylenol and aspirin. Current therapy: none. DVT prophylaxis: 81 Aspirin bid.Wound care: His wife is applying betadine paint with new dressing bid.He has pictures from yesterday.     PMH:   Past Medical History:   Diagnosis Date   . Carotid Sinus Hypersensitivity    . Disc disorder of lumbosacral region    . Hip injury    . HYPERTENSION     . HYPOTENSION     . Intraparenchymal hemorrhage of brain ([facility]) 2017   . Pain of right hip joint 01/23/2016   . 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 Syncope    . 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([facility])    . URI (upper respiratory infection)         Review of patient's allergies indicates:  Allergies   Allergen Reactions   . Adhesives Rash   . Bee  Venom Hives, Itching and Swelling   . Gabapentin      Flu like symptoms, diarrhea, body ache upset stomach        Medications:   Outpatient Medications Prior to Visit   Medication Sig Dispense Refill   . Acetaminophen 500 MG Oral Tab 2 tablets bid     . albuterol HFA 108 (90 Base) MCG/ACT inhaler Inhale 2 puffs by mouth every 4 hours as needed for shortness of breath/wheezing. (Patient not taking: Reported on 01/13/2019) 1 Inhaler 2   . Alpha-Lipoic Acid 300 MG tablet Take 1 tablet by mouth daily.     [name]. amLODIPine 5 MG tablet Take 1 tablet (5 mg) by mouth 2 times a day. 90 tablet 3   . amoxicillin-clavulanate (Augmentin) 500-125 MG tablet Take 1 tablet (500 mg) by mouth every 12 hours. 20 tablet 0   . amoxicillin-clavulanate (Augmentin) 500-125 MG tablet Take 1 tablet (500 mg) by mouth every 12 hours. 21 tablet 0   . atorvastatin 10 MG Oral Tablet Take 1 tablet (10 mg) by mouth daily. 90 tablet 2   . BENFOTIAMINE OR Take 250 mg by mouth daily.     . Cholecalciferol (VITAMIN D3) 2000 units Oral Cap Take 1 capsule (2,000 Units) by mouth daily. For low level 1 capsule 1   . Cyanocobalamin 1000 MCG Oral Tab one per day over-the-counter started 5/21 /2012 this level borderline low 1 Tab 1   .  diclofenac 1 % gel      . DULoxetine 30 MG DR capsule Take 1 capsule (30 mg) by mouth daily. 90 capsule 1   . DULoxetine 60 MG DR capsule Take 1 capsule (60 mg) by mouth daily. 90 capsule 1   . EPINEPHrine 0.3 MG/0.3ML Injection Solution Auto-injector Inject as instructed per patient package insert, 0.3 mg intramuscularly or subcutaneously into the thigh, if needed to treat anaphylaxis 3 each 1   . Lidocaine 5 % External Patch Apply 1 patch onto the skin daily. Apply to painful area for up to 12 hours in a 24 hour period. 30 patch 10   . lidocaine 5 % patch Apply 1 patch onto the skin daily. Apply to painful area for up to 12 hours in a 24 hour period. 30 patch 0   . lisinopril 20 MG tablet Take 1 tablet (20 mg) by mouth every 12  hours. 180 tablet 3   . meloxicam 7.5 MG tablet TAKE ONE TABLET BY MOUTH EVERY MORNING 60 tablet 3   . memantine 10 MG tablet Take 1 tablet (10 mg) by mouth daily. 30 tablet 0   . naloxone (Narcan) 4 MG/0.1ML nasal spray Use 1 spray in one nostril for suspected opioid overdose. Call 911. If unresponsive in 2 to 3 minutes, repeat with new naloxone nasal spray. (Patient not taking: Reported on 03/17/2019) 2 each 0   . oxyCODONE 5 MG tablet Take 1 tablet (5 mg) by mouth every 6 hours as needed for severe pain. 10 tablet 0   . oxyCODONE 5 MG tablet Take 1 tablet (5 mg) by mouth every 6 hours as needed for severe pain. 20 tablet 0   . oxyCODONE 5 MG tablet Take 1 tablet (5 mg) by mouth every 6 hours as needed. (Patient not taking: Reported on 03/02/2019) 20 tablet 0   . oxyCODONE 5 MG tablet Take 1 tablet (5 mg) by mouth every 6 hours as needed for severe pain. (Patient not taking: Reported on 03/02/2019) 20 tablet 0   . oxyCODONE 5 MG tablet Take 1 tablet (5 mg) by mouth every 6 hours as needed for severe pain. (Patient not taking: Reported on 03/17/2019) 30 tablet 0   . pregabalin 100 MG capsule Take 1 capsule (100 mg) by mouth 2 times a day. 180 capsule 1   . pregabalin 50 MG capsule 1 po bid and start 50 mg at lunch per day for one week and inc by 50 mg weekly until 150 at lunch as tolerated 270 capsule 1   . primidone 50 MG tablet Take 1 tablet (50 mg) by mouth 2 times a day. 180 tablet 1   . solifenacin 5 MG tablet Take 1 tablet (5 mg) by mouth daily. 30 tablet 11   . thiamine 100 MG tablet Take 1 tablet by mouth daily.      [name] triamcinolone 0.1 % cream As needed to right ear bid x 1 week 1 Tube 0     No facility-administered medications prior to visit.         Objective:   Vitals:    03/17/19 1342 03/17/19 1344   BP: (!) 131/91    BP Cuff Size: Regular    BP Site: Right Arm    BP Position: Sitting    Pulse: 93    Temp:  96.7 F (35.9 C)      Cast condition:  The cast is worn on the heel and rubber heel is loose.  The  cast was removed without complication.    The surgical sites were examined and the incision(s) has/have almost completel heel except on 2 mm wound at medial hallux with granulation tissue  There is no drainage, no odor, no sinus tract and no evidence of cellulitis.  There is mild pain on surgical site.  There is moderated swelling around the surgical site dorsally likely to not elevated.  The distal pin on hallux is stable with NO erythema and drainage.  The medial pin is pressed inward due to his movement toward the cast and it is loose.    There is no signs of DVT i.e. no calf pain, swelling and redness.   The hallux is in satisfactory position.     DIAGNOSTIC IMAGING:    Right:  AP/LAT/MO foot (CPT- 16109- 73630) views     Finding:   The 1st metatarso-phalangeal joint and hallux proximal phalanx  fusion and fracture  is/are well opposed/approximated. The fixation is solid and good position. The osseous alignment is satisfactory.  There is no evidence of osteolysis or emphysema noted.    Assessment:   Post op 5 weeks revision surgery to address (U04.540J(S92.421A) Closed displaced fracture of distal phalanx of right great toe, initial encounter  (primary encounter diagnosis)  (M20.21) Hallux rigidus, right foot     The patient is making fair progress post-operatively against his chronic non compliance.   The open wound is almost closed but there is significant swelling.   The patient had difficulty complying to post-operative instruction.    Plan:    A discussion took place regarding post-operative progress.  Patient was instructed to call our office if they notice any significant change is post-operative progress and experience severe pain, swelling, fever/chill, calf pain, SOB.  The medial K-wire/pin was removed with complication.  Antibiotic ointment with Band-Aid was apply and instructed continue till head and on the distal K-wire/pin was covered with Band-Aid and instructed to keep it clean.  Precaution were stressed today  regards to level of activity/elevation/icing where the elevation is critical is addressing the swelling.  His spouse states that he has been elevating on a lounge chair but it truly is not elevating high enough - 8-10" elevation was discussed.    At this point he does not need wound care.  The small granulation tissue was removed and antibiotic ointment was applied which I suspect will heal in the next few days.  Therefore he does not need to apply any antibiotic ointment or Betadine.    We have reviewed x-ray finding and detail discussion took place with the patient.      Ambulatory status:  The patient was instructed to continue non weight bearing till next visit.  We had extensive discussion of how he can comply with the nonweightbearing status.  He agreed that he will comply with using the long cast boot that is slightly longer to prevent bending movement of the K wire.  We have covered the proximal and with the Covan as a deterrent for him to not to take the cast boot off.  We do not have the zip tie to wrap around the cast boot as a deterrent so that he does not remove the boot.    DVT prophylaxis:  81 Aspirin bid.     Pain management:  The patient was instructed to use rest, ice, elevation, compression and NSAID as primary care for pain and swelling.  At this point we agree to use:  Tylenol.  Regards to neuropathic  pain I will defer to the pain clinic or the is PCP.    Return/Follow-up:  as schedule    Randa Spikeony D.H. Kim DPM, FACFAS  Reconstructive Ankle and Foot Surgeon  [facility]

## 2019-03-17 NOTE — Patient Instructions (Signed)
Ambulatory status:  The patient was instructed to continue non weight bearing till next visit.

## 2019-03-17 NOTE — Progress Notes (Signed)
Ronnie Mason is a pt of Dr. Maudie Mercury.    During his appt w/Dr. Maudie Mercury today I was asked to remover the cast on [name]'s R foot/leg.  That was done quickly.    After he visited w/Dr. Maudie Mercury I was asked to provide Ronnie Mason with a boot that will protect is foot but that he will not be walking on.  Ronnie Mason has a boot at home, it is a L.    Unfortunately, the L boot I tried to fit him with was just a little bit short.  He is a size 12.5 and his toes were a bit closer to the distal end of the boot that I wanted.  The concern I had was where the injury was could get damaged if the front of the boot was struck and the toes were not protected.    Because of thisI fit him for an XL boot.  I was very clear with both Ronnie Mason and his wife that Ronnie Mason is NOT to walk in this boot.  The distal end of his toes are approx 2 - 2.5 inches away from the end of the boot.  This will protect the end of his toes but if he tries to walk on it he can trip because the boot is simply too long for him.  I educated that when he is allowed to walk he should transfer back to his boot he has at home.    They both said they understand.

## 2019-03-21 ENCOUNTER — Encounter (INDEPENDENT_AMBULATORY_CARE_PROVIDER_SITE_OTHER): Payer: Self-pay | Admitting: Podiatrist

## 2019-03-21 ENCOUNTER — Encounter (INDEPENDENT_AMBULATORY_CARE_PROVIDER_SITE_OTHER): Payer: Self-pay

## 2019-03-21 ENCOUNTER — Telehealth (INDEPENDENT_AMBULATORY_CARE_PROVIDER_SITE_OTHER): Payer: Self-pay | Admitting: Podiatrist

## 2019-03-21 ENCOUNTER — Other Ambulatory Visit (INDEPENDENT_AMBULATORY_CARE_PROVIDER_SITE_OTHER): Payer: Self-pay | Admitting: Podiatrist

## 2019-03-21 DIAGNOSIS — S92421A Displaced fracture of distal phalanx of right great toe, initial encounter for closed fracture: Secondary | ICD-10-CM

## 2019-03-21 DIAGNOSIS — M2021 Hallux rigidus, right foot: Secondary | ICD-10-CM

## 2019-03-21 MED ORDER — AMOXICILLIN-POT CLAVULANATE 500-125 MG OR TABS
500.0000 mg | ORAL_TABLET | Freq: Two times a day (BID) | ORAL | 0 refills | Status: DC
Start: 2019-03-21 — End: 2020-03-01

## 2019-03-21 NOTE — Telephone Encounter (Signed)
Refill Request:    Medication and dosage: amoxicillin-clavulanate (Augmentin) 500-125 MG tablet  Pick up location: Pharmacy  Pharmacy information: [facility] [place]  Additional information:   Return Call: Detailed message on voicemail

## 2019-03-21 NOTE — Telephone Encounter (Signed)
done

## 2019-03-21 NOTE — Progress Notes (Signed)
Talked with Ronnie Mason after reviewing pictures of his foot he sent via ecare with Dr Maudie Mercury,   [name]'s concern was that it was getting bigger , especially in knuckle area. Dr Maudie Mercury thought it looked ok, he needs to keep it elevated much more then he is doing. He was instructed to leave it alone, although he opened it up and washed it. Instructed him again to put gauze on it and close it up with boot straps and leave it alone. He understands this.  Offered to have him come in and let us take a peak at him, he felt that was not necessary and will see [place] next week. Again I stressed on him to elevate it a lot

## 2019-03-24 ENCOUNTER — Ambulatory Visit (INDEPENDENT_AMBULATORY_CARE_PROVIDER_SITE_OTHER): Payer: PPO | Admitting: Podiatrist

## 2019-03-24 VITALS — BP 118/82 | HR 101

## 2019-03-24 DIAGNOSIS — S92421A Displaced fracture of distal phalanx of right great toe, initial encounter for closed fracture: Secondary | ICD-10-CM

## 2019-03-24 DIAGNOSIS — S92421D Displaced fracture of distal phalanx of right great toe, subsequent encounter for fracture with routine healing: Secondary | ICD-10-CM

## 2019-03-24 DIAGNOSIS — M2021 Hallux rigidus, right foot: Secondary | ICD-10-CM

## 2019-03-24 NOTE — Progress Notes (Signed)
Procedure: ORIFClosed displaced fracture of distal phalanx of right great toe and revision of 1st MTPJ arthrodesis due to non-compliance of NWB, rightRIGHT  PO week #:  6 weeks, Date of surgery:  02/09/19    Ronnie Mason  is a 68 year old year old male return today for wound care/check.  Patient relates current issues: incisional numbness, cast is too tight, having foot accidently stepped on and bleeding through dressings.   He states that he move is foot a lot even in the boot.  The patient states of 2/10 pain.  The patient is compliant with post-operative instructions.  Current instructed post-op weightbearing status is cast boot.  The patient admits of bleeding.  He denies of fever/chill.  Patient denies of side effects of the prescribed medication.  Current post-op pain medication is none and Tylenol and currently on Augmentin.   The pain is well controlled and patient has stopped taking opioids.  Current therapy:  no physical therapy at this point.  DVT prophylaxis:  none - patient is ambulating.

## 2019-03-24 NOTE — Progress Notes (Signed)
Post-Surgery Follow-Up SOAP Note    Patient: Ronnie Mason   Patient DOB: 06/15/1951     DOS:  03/24/2019     Accompanied by:  spouse    Subjective:    Procedure: ORIFClosed displaced fracture of distal phalanx of right great toe and revision of 1st MTPJ arthrodesisdue to non-compliance of NWB, rightRIGHT  PO week #:  6 weeks, Date of surgery:  02/09/19    Ronnie Mason  is a 68 year old year old male return today is unscheduled visit.  Patient relates current issues: incisional numbness, bleeding through dressings.   He states that he move is foot a lot even in boot.  The patient states of 2/10 pain.  The patient is compliant with post-operative instructions.  Current instructed post-op weightbearing status is cast boot.  The patient admits of bleeding.  He denies of fever/chill.  Patient denies of side effects of the prescribed medication.  Current post-op pain medication is none and Tylenol and currently on Augmentin.   The pain is well controlled and patient has stopped taking opioids.  Current therapy:  no physical therapy at this point.  DVT prophylaxis:  none - patient is ambulating.      PMH:   Past Medical History:   Diagnosis Date   . Carotid Sinus Hypersensitivity    . Disc disorder of lumbosacral region    . Hip injury    . HYPERTENSION     . HYPOTENSION     . Intraparenchymal hemorrhage of brain (HCC) 2017   . Pain of right hip joint 01/23/2016   . 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 Syncope    . 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(HCC)    . URI (upper respiratory infection)         Review of patient's allergies indicates:  Allergies   Allergen Reactions   . Adhesives Rash   . Bee Venom Hives, Itching and Swelling   . Gabapentin      Flu like symptoms, diarrhea, body ache upset stomach   . Methocarbamol Other     Patient's wife reports cognitive difficulties ("goofy")        Medications:   Outpatient Medications Prior to Visit   Medication Sig Dispense  Refill   . Acetaminophen 500 MG Oral Tab 2 tablets bid     . albuterol HFA 108 (90 Base) MCG/ACT inhaler Inhale 2 puffs by mouth every 4 hours as needed for shortness of breath/wheezing. (Patient not taking: Reported on 01/13/2019) 1 Inhaler 2   . Alpha-Lipoic Acid 300 MG tablet Take 1 tablet by mouth daily.     [name]. amLODIPine 5 MG tablet Take 1 tablet (5 mg) by mouth 2 times a day. 90 tablet 3   . amoxicillin-clavulanate (Augmentin) 500-125 MG tablet Take 1 tablet (500 mg) by mouth every 12 hours. 21 tablet 0   . amoxicillin-clavulanate (Augmentin) 500-125 MG tablet Take 1 tablet (500 mg) by mouth every 12 hours. 20 tablet 0   . atorvastatin 10 MG Oral Tablet Take 1 tablet (10 mg) by mouth daily. 90 tablet 2   . BENFOTIAMINE OR Take 250 mg by mouth daily.     . Cholecalciferol (VITAMIN D3) 2000 units Oral Cap Take 1 capsule (2,000 Units) by mouth daily. For low level 1 capsule 1   . Cyanocobalamin 1000 MCG Oral Tab one per day over-the-counter started 5/21 /2012 this level borderline low 1 Tab 1   . diclofenac 1 %  gel      . DULoxetine 30 MG DR capsule Take 1 capsule (30 mg) by mouth daily. 90 capsule 1   . DULoxetine 60 MG DR capsule Take 1 capsule (60 mg) by mouth daily. 90 capsule 1   . EPINEPHrine 0.3 MG/0.3ML Injection Solution Auto-injector Inject as instructed per patient package insert, 0.3 mg intramuscularly or subcutaneously into the thigh, if needed to treat anaphylaxis 3 each 1   . Lidocaine 5 % External Patch Apply 1 patch onto the skin daily. Apply to painful area for up to 12 hours in a 24 hour period. 30 patch 10   . lidocaine 5 % patch Apply 1 patch onto the skin daily. Apply to painful area for up to 12 hours in a 24 hour period. 30 patch 0   . lisinopril 20 MG tablet Take 1 tablet (20 mg) by mouth every 12 hours. 180 tablet 3   . meloxicam 7.5 MG tablet TAKE ONE TABLET BY MOUTH EVERY MORNING 60 tablet 3   . memantine 10 MG tablet Take 1 tablet (10 mg) by mouth daily. 30 tablet 0   . naloxone  (Narcan) 4 MG/0.1ML nasal spray Use 1 spray in one nostril for suspected opioid overdose. Call 911. If unresponsive in 2 to 3 minutes, repeat with new naloxone nasal spray. (Patient not taking: Reported on 03/17/2019) 2 each 0   . oxyCODONE 5 MG tablet Take 1 tablet (5 mg) by mouth every 6 hours as needed for severe pain. 10 tablet 0   . oxyCODONE 5 MG tablet Take 1 tablet (5 mg) by mouth every 6 hours as needed for severe pain. 20 tablet 0   . oxyCODONE 5 MG tablet Take 1 tablet (5 mg) by mouth every 6 hours as needed. (Patient not taking: Reported on 03/02/2019) 20 tablet 0   . oxyCODONE 5 MG tablet Take 1 tablet (5 mg) by mouth every 6 hours as needed for severe pain. (Patient not taking: Reported on 03/02/2019) 20 tablet 0   . oxyCODONE 5 MG tablet Take 1 tablet (5 mg) by mouth every 6 hours as needed for severe pain. (Patient not taking: Reported on 03/17/2019) 30 tablet 0   . pregabalin 100 MG capsule Take 1 capsule (100 mg) by mouth 2 times a day. (Patient taking differently: Take 200 mg by mouth 2 times a day. ) 180 capsule 1   . pregabalin 50 MG capsule 1 po bid and start 50 mg at lunch per day for one week and inc by 50 mg weekly until 150 at lunch as tolerated 270 capsule 1   . primidone 50 MG tablet Take 1 tablet (50 mg) by mouth 2 times a day. 180 tablet 1   . solifenacin 5 MG tablet Take 1 tablet (5 mg) by mouth daily. 30 tablet 11   . thiamine 100 MG tablet Take 1 tablet by mouth daily.      [name] triamcinolone 0.1 % cream As needed to right ear bid x 1 week 1 Tube 0     No facility-administered medications prior to visit.         Objective:   Vitals:    03/24/19 1512   BP: 118/82   BP Cuff Size: Regular   Pulse: (!) 101   SpO2: 95%        The surgical sites were examined and the incision(s) has hematoma on dorsal base of the hallus where the ace bandage has rolled up and rubbing on the  skin.  There is small residual granulation tissue just medial to the hematoma.  There is noticeable swelling where the ace  base is not on the foot which is hallux.   There is mild pain on surgical site.  There is is no erythema/ evidence of cellulitis noted.  There is no signs of DVT i.e. no calf pain, swelling and redness.   There is tiny about drainage on the tip of the k-wire but it is stable.  His chronic toe/foot is finally irritating the k-wire.      DIAGNOSTIC IMAGING:    Right:  AP/LAT/MO foot (CPT- 16109- 73630) views     Finding:   The 1st metatarso-phalangeal joint and hallux proximal phalanx  fusion and fracture  is/are fused on 1st MTPJ but the fracture line is visible on the base of proximal phalanx. The fixation is backing out on distal screw. The osseous alignment is satisfactory.  There is small round cyst note head of proximally phalanx.  There is no evidence of osteolysis or emphysema noted.    Assessment:   Post op 6 weeks to address (U04.540J(S92.421A) Closed displaced fracture of distal phalanx of right great toe, initial encounter  (primary encounter diagnosis)  (M20.21) Hallux rigidus, right foot     The patient is making steady progress post-operatively but continue have challenges in his recovery due to overall lack of strict compliance.  I express my concern of bone infection/osteomyelitis - currently he is on Augmentin.     Plan:    A discussion took place regarding post-operative progress.  Patient was instructed to call our office if they notice any significant change is post-operative progress and experience severe pain, swelling, fever/chill, calf pain, SOB.  At this point we decided to pull the pin since it is irrigated.  Using sterile pin puller, K-wire was removed with complication.   Precaution were stressed today regards to level of activity/elevation/icing.    Following dressing was applied:  betadine paint was applied along the hematoma site/tip of the hallux and Ace bandage was applied for compression.     We have reviewed x-ray finding and detail discussion took place with the patient.      Regards to wounds: I  have explore the hematoma/small granulation tissue due sterile mosquito and NO purulence was expressed and only blood that was clotted.  I recommend apply betadine paint bid with 4x4 and compression/ace bandage around the hold foot/toes to provide homogeneous compression.  With his chronic foot movement, I recommend sleeping and resting without boot.     Ambulatory status:  The patient was instructed to continue non weight bearing using camboot/cast boot and knee scooter.       DVT prophylaxis:  81 Aspirin bid.     Pain management:  The patient was instructed to use rest, ice, elevation, compression and NSAID as primary care for pain and swelling.  At this point we agree to use:  Tylenol and Ibuprofen.     Return/Follow-up:  follow up 1 - 2 weeks for post op check and x-ray AP/LAT/ MO views foot    Randa Spikeony D.H. Kim DPM, FACFAS  Reconstructive Ankle and Foot Surgeon  [facility]

## 2019-03-24 NOTE — Patient Instructions (Signed)
Apply betadine paint bid with 4x4 and compression/ace bandage around the hold foot/toes to provide homogeneous compression.  With his chronic foot movement, I recommend sleeping and resting without boot.     Ambulatory status:  The patient was instructed to continue non weight bearing using camboot/cast boot and knee scooter.

## 2019-03-29 ENCOUNTER — Other Ambulatory Visit (HOSPITAL_BASED_OUTPATIENT_CLINIC_OR_DEPARTMENT_OTHER): Payer: Self-pay | Admitting: Anesthesiology

## 2019-03-29 ENCOUNTER — Encounter (INDEPENDENT_AMBULATORY_CARE_PROVIDER_SITE_OTHER): Payer: PPO | Admitting: Podiatrist

## 2019-03-29 DIAGNOSIS — G629 Polyneuropathy, unspecified: Secondary | ICD-10-CM

## 2019-03-30 MED ORDER — MEMANTINE HCL 10 MG OR TABS
ORAL_TABLET | ORAL | 0 refills | Status: DC
Start: 2019-03-30 — End: 2019-05-08

## 2019-03-30 NOTE — Telephone Encounter (Signed)
Requested Medication: memantine 10mg   Requested by: pharmacy    Last refill written: 03/02/19  Written by: Dr. Lisbeth Ply    Last appointment: 03/02/2019 with Dr. Lisbeth Ply  Scheduled follow up: 04/14/2019 with Dr. Lisbeth Ply    Patient Information    Name   Ronnie Mason [phone]) Identity ID   [id number] Sex   Male DOB   [birth year]   Medication order tracking    Outpatient Medication Detail    memantine 10 MG tablet        Sig: Take 1 tablet (10 mg) by mouth daily.        Sent to pharmacy as: Memantine HCl 10 MG Oral Tablet        Class: Normal        Route: Oral        Order: [id number]        E-Prescribing Status: Receipt confirmed by pharmacy (03/02/2019 4:23 PM PDT)        Order Cedric Fishman Method   E-Prescribed [3]   Pharmacy    [facility] [id number] [place] [phone]   Associated Diagnoses:    Neuropathy [G62.9]       Order Providers    Prescribing Provider Encounter Provider   Art Buff, MD Art Buff, MD   Medication Detail    Medication Quantity Refills Start End   memantine 10 MG tablet 30 tablet 0 03/02/2019    Sig: Comment:   Take 1 tablet (10 mg) by mouth daily.    Admin Instructions:      Route: PRN Reasons: PRN Comment:   Oral     Order Class: Number of Doses: Order #: DAW:   Normal  [id number] No   NDC: LOT #: MAR COMMENT:        Discontinuing user Discontinue Reason Order End Date Order End Time         Order Information    Date and Time Department Ordering/Authorizing   03/02/2019 4:23 PM [facility] Art Buff, MD   Electronically signed by    Art Buff

## 2019-04-07 ENCOUNTER — Ambulatory Visit (INDEPENDENT_AMBULATORY_CARE_PROVIDER_SITE_OTHER): Payer: PPO | Admitting: Podiatrist

## 2019-04-07 ENCOUNTER — Other Ambulatory Visit: Payer: Self-pay

## 2019-04-07 DIAGNOSIS — S91301A Unspecified open wound, right foot, initial encounter: Secondary | ICD-10-CM

## 2019-04-07 DIAGNOSIS — S92421A Displaced fracture of distal phalanx of right great toe, initial encounter for closed fracture: Secondary | ICD-10-CM

## 2019-04-07 DIAGNOSIS — M2021 Hallux rigidus, right foot: Secondary | ICD-10-CM

## 2019-04-07 DIAGNOSIS — S92421D Displaced fracture of distal phalanx of right great toe, subsequent encounter for fracture with routine healing: Secondary | ICD-10-CM

## 2019-04-07 DIAGNOSIS — S91301D Unspecified open wound, right foot, subsequent encounter: Secondary | ICD-10-CM

## 2019-04-07 MED ORDER — AMOXICILLIN-POT CLAVULANATE 500-125 MG OR TABS
500.0000 mg | ORAL_TABLET | Freq: Two times a day (BID) | ORAL | 0 refills | Status: AC
Start: 2019-04-07 — End: 2019-04-17

## 2019-04-07 NOTE — Progress Notes (Addendum)
Post-Surgery Follow-Up SOAP Note    Patient: Ronnie Mason   Patient DOB: 04/01/1951     DOS:  04/07/2019     Accompanied by:  wife    Subjective:    Procedure:ORIFClosed displaced fracture of distal phalanx of right great toe and revision of 1st MTPJ arthrodesisdue to non-compliance of NWB, rightRIGHT  PO week #:8 weeks,Date of surgery:02/09/19, 2nd sx, 01/12/19 1st sx    Ronnie GranaDavid Paul Sobczyk is a 68 year old year old malereturn todayfor status post visit. Patient relates current issues: none, is walking in athletic shoes, which he said he modified by stretching big toe areaHe states that he move is foot a lot even inboot.The patient states of no pain, still little drainage on dorsal wound but closing in per wife's observation. The patient is compliant with post-operative instructions. Current instructed post-op weightbearing status is cast boot, but when left for last appt took off boot and started wearing athletic shoe.The patient admits of very little bleeding.He denies of fever/chill.Patientdenies of side effects of the prescribed medication. Current post-op pain medication is none and Tylenoland currently on Augmentin.The pain is well controlledand patient has stopped taking opioids. Current therapy: no physical therapy at this point. DVT prophylaxis: none - patient is ambulating.    PMH:   Past Medical History:   Diagnosis Date   . Carotid Sinus Hypersensitivity    . Disc disorder of lumbosacral region    . Hip injury    . HYPERTENSION     . HYPOTENSION     . Intraparenchymal hemorrhage of brain (HCC) 2017   . Pain of right hip joint 01/23/2016   . 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 Syncope    . 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(HCC)    . URI (upper respiratory infection)         Review of patient's allergies indicates:  Allergies   Allergen Reactions   . Adhesives Rash   . Bee Venom Hives, Itching and Swelling   . Gabapentin      Flu  like symptoms, diarrhea, body ache upset stomach   . Methocarbamol Other     Patient's wife reports cognitive difficulties ("goofy")        Medications:   Outpatient Medications Prior to Visit   Medication Sig Dispense Refill   . Acetaminophen 500 MG Oral Tab 2 tablets bid     . albuterol HFA 108 (90 Base) MCG/ACT inhaler Inhale 2 puffs by mouth every 4 hours as needed for shortness of breath/wheezing. (Patient not taking: Reported on 01/13/2019) 1 Inhaler 2   . Alpha-Lipoic Acid 300 MG tablet Take 1 tablet by mouth daily.     [name]. amLODIPine 5 MG tablet Take 1 tablet (5 mg) by mouth 2 times a day. 90 tablet 3   . amoxicillin-clavulanate (Augmentin) 500-125 MG tablet Take 1 tablet (500 mg) by mouth every 12 hours. 21 tablet 0   . amoxicillin-clavulanate (Augmentin) 500-125 MG tablet Take 1 tablet (500 mg) by mouth every 12 hours. 20 tablet 0   . atorvastatin 10 MG Oral Tablet Take 1 tablet (10 mg) by mouth daily. 90 tablet 2   . BENFOTIAMINE OR Take 250 mg by mouth daily.     . Cholecalciferol (VITAMIN D3) 2000 units Oral Cap Take 1 capsule (2,000 Units) by mouth daily. For low level 1 capsule 1   . Cyanocobalamin 1000 MCG Oral Tab one per day over-the-counter started 5/21 /2012 this level borderline low  1 Tab 1   . diclofenac 1 % gel      . DULoxetine 30 MG DR capsule Take 1 capsule (30 mg) by mouth daily. 90 capsule 1   . DULoxetine 60 MG DR capsule Take 1 capsule (60 mg) by mouth daily. 90 capsule 1   . EPINEPHrine 0.3 MG/0.3ML Injection Solution Auto-injector Inject as instructed per patient package insert, 0.3 mg intramuscularly or subcutaneously into the thigh, if needed to treat anaphylaxis 3 each 1   . Lidocaine 5 % External Patch Apply 1 patch onto the skin daily. Apply to painful area for up to 12 hours in a 24 hour period. 30 patch 10   . lidocaine 5 % patch Apply 1 patch onto the skin daily. Apply to painful area for up to 12 hours in a 24 hour period. 30 patch 0   . lisinopril 20 MG tablet Take 1 tablet (20  mg) by mouth every 12 hours. 180 tablet 3   . meloxicam 7.5 MG tablet TAKE ONE TABLET BY MOUTH EVERY MORNING 60 tablet 3   . memantine 10 MG tablet TAKE ONE TABLET BY MOUTH ONCE DAILY 30 tablet 0   . naloxone (Narcan) 4 MG/0.1ML nasal spray Use 1 spray in one nostril for suspected opioid overdose. Call 911. If unresponsive in 2 to 3 minutes, repeat with new naloxone nasal spray. (Patient not taking: Reported on 03/17/2019) 2 each 0   . oxyCODONE 5 MG tablet Take 1 tablet (5 mg) by mouth every 6 hours as needed for severe pain. (Patient not taking: Reported on 04/07/2019) 10 tablet 0   . oxyCODONE 5 MG tablet Take 1 tablet (5 mg) by mouth every 6 hours as needed for severe pain. (Patient not taking: Reported on 04/07/2019) 20 tablet 0   . oxyCODONE 5 MG tablet Take 1 tablet (5 mg) by mouth every 6 hours as needed. (Patient not taking: Reported on 03/02/2019) 20 tablet 0   . oxyCODONE 5 MG tablet Take 1 tablet (5 mg) by mouth every 6 hours as needed for severe pain. (Patient not taking: Reported on 03/02/2019) 20 tablet 0   . oxyCODONE 5 MG tablet Take 1 tablet (5 mg) by mouth every 6 hours as needed for severe pain. (Patient not taking: Reported on 03/17/2019) 30 tablet 0   . pregabalin 100 MG capsule Take 1 capsule (100 mg) by mouth 2 times a day. (Patient taking differently: Take 200 mg by mouth 2 times a day. ) 180 capsule 1   . pregabalin 50 MG capsule 1 po bid and start 50 mg at lunch per day for one week and inc by 50 mg weekly until 150 at lunch as tolerated 270 capsule 1   . primidone 50 MG tablet Take 1 tablet (50 mg) by mouth 2 times a day. 180 tablet 1   . solifenacin 5 MG tablet Take 1 tablet (5 mg) by mouth daily. 30 tablet 11   . thiamine 100 MG tablet Take 1 tablet by mouth daily.      [name] triamcinolone 0.1 % cream As needed to right ear bid x 1 week 1 Tube 0     No facility-administered medications prior to visit.         Objective:   There were no vitals filed for this visit.     The surgical sites were  examined and the incision(s) has 3 mm x 78mm open on base of proximal phalanx region without drainage/odor/erythema.  However is general  swelling and redness on the surgical which could be typical.  There is minimal pain on surgical site.  There is mild-moderate edema and is erythema noted but no evidence of lymphangitis/cellulitis.  There is no signs of DVT i.e. no calf pain, swelling and redness. There is no motion noted at 1st MTPJ and proximal phalanx.     DIAGNOSTIC IMAGING:    Right:  AP/LAT/MO/LO foot (CPT- 41638) views     Finding:   Post-op x-ray:  The 1st metatarso-phalangeal joint and hallux proximal phalanx  fusion and fracture  is/are well opposed/approximated and healing. The fixation is solid and good position with slight backing out of distal but has not change from prior x-ray. The osseous alignment is good.  There is evidence of osteolysis on the head of the proximal phalanx with NO emphysema noted. He lucency has not change from prior x-ray.    Assessment:   Post op 8 weeks to address (G53.646O) Closed displaced fracture of distal phalanx of right great toe, initial encounter  (primary encounter diagnosis)  (M20.21) Hallux rigidus, right foot  (S91.301A) Open wound of right foot, initial encounter     The patient is making fair progress post-operatively. The patient appears to be NON COMPLIANT regarding post-operative instruction.    Plan:    A discussion took place regarding post-operative progress.  Patient was instructed to call our office if they notice any significant change is post-operative progress and experience severe pain, swelling, fever/chill, calf pain, SOB.  We have betadine paint was applied on the small open wound site which need to continue till heal.  Precaution were stressed today regards to level of activity/elevation/icing.       We have reviewed x-ray finding and detail discussion took place with the patient.      I have express my concern regards to open wound and possible  bone injection.  He has been on oral abx for 40 days.  We will extend another 10 days Augmentin for precautionary measure.  I have mention regards wound care clinic which they decline.  It this has not completely heal by next visit, we will send him to wound care clinic.     Following prescription was given:  Augmentin(500) #20 bid and The antibiotic prescription was given for precautionary measure.      Ambulatory status:  The patient was instructed to progressive gradual weight bearing as tolerated using stable shoes.        Patient allowed activities: shower starting tomorrow with wound cover but NO hot tubing/swimming    Pain management:  The patient was instructed to use rest, ice, elevation, compression and NSAID as primary care for pain and swelling.  At this point we agree to use:  Tylenol.     Return/Follow-up:  follow up in 2 weeks for wound check.     [name] DPM, FACFAS  Reconstructive Ankle and Foot Surgeon  [facility]

## 2019-04-07 NOTE — Progress Notes (Signed)
Post-Surgery Follow-Up SOAP Note    Patient: Ronnie Mason   Patient DOB: 1950-09-17     DOS:  04/07/2019     Accompanied by:  spouse    Subjective:    Procedure:ORIFClosed displaced fracture of distal phalanx of right great toe and revision of 1st MTPJ arthrodesisdue to non-compliance of NWB, rightRIGHT  PO week #:8 weeks,Date of surgery:02/09/19, 2nd sx, 01/12/19 1st sx    [name] is a 69 year old year old malereturn today for status post visit. Patient relates current issues: none, is walking in athletic shoes, which he said he modified by stretching big toe areaHe states that he move is foot a lot even in boot.The patient states of no pain, still little drainage on dorsal wound but closing in per wife's observation. The patient is compliant with post-operative instructions. Current instructed post-op weightbearing status is cast boot, but when left for last appt took off boot and started wearing athletic shoe.The patient admits of very little bleeding.He denies of fever/chill.Patientdenies of side effects of the prescribed medication. Current post-op pain medication is none and Tylenoland currently on Augmentin.The pain is well controlledand patient has stopped taking opioids. Current therapy: no physical therapy at this point. DVT prophylaxis: none - patient is ambulating.

## 2019-04-07 NOTE — Patient Instructions (Signed)
Apply betadine paint with sterile dressing daily till heal.

## 2019-04-13 NOTE — Progress Notes (Signed)
04/14/2019   [name];  MRN: [id number];   DOB: 1950-12-13    Chief Complaint   Patient presents with   . Back Pain       History of Present Illness:  Mr. Mashak was  last seen by Dr. Araceli Bouche on 03/02/2019    From that visit:  "Agrim Wieting is a 68 year old male with   1.Axial low back pain concerning for facet arthropathy at L5-S1 with prior positive response to RFA when including L4-L5 as well.  2. R leg/foot pain laterally in settingmallet toe and peroneal brevis and longus tear diagnosed by podiatry- now s/p R foot 1 MTP arthrodesis  01/12/19.  3. Prior treatment failures including: None  4. Medical comorbidities significant formultiple pain syndromes (neck pain, shoulder pain previously, groin/hip pain, prior greater trochanteric pain syndrome, R foot fracture, R lateral malleolus fracture).    PLAN:     My impressions and treatment recommendations were discussed in detail with the patient who had no further questions before leaving the office today.     I adamantly reinforced with the patient the importance of maintaining his nonweightbearing status as this will affect the overall healing of his foot and ankle. I encouraged him to discuss with his physical therapist more strategies for tranisitioning     Given ongoing nerve related complaints in the right foot and ankle I believe it is reasonable to increase his pregabalin to a goal dose of 150 mg 3 times a day or 450 mg per 24 hours.  The patient should follow-up with his prescribing provider, Dr. Marisa Sprinkles, in order to increase this medication.  It may be prudent to increase by 50 mg every several days from his present dose until goal dose is reached.    I encouraged the patient to continue using acetaminophen as needed for pain.  Currently he is using 4 g or less per 24 hours.  More commonly the patient is using 3 g or less.    As for the patient's back pain, and encouraged him to increase his use of the TENS unit throughout his back.  He does have  significant muscular spasms at this time.  I will plan to see the patient back for follow-up in approximately 1 month after he has had his cast removed.  At that time we will determine whether it is appropriate to consider trigger point injections for his muscular spasms versus a short course of muscle relaxants.  At that visit we will also evaluate whether to increase dose of memantine or discontinue this medication. One month refill of memantine was provided today.    He is here today for a follow up visit.   [facility] with the following problem list:    Patient Active Problem List    Diagnosis Date Noted   . Syncope [R55]    . Hypotension [I95.9]    . 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 URI (upper respiratory infection) [J06.9]    . 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 Closed fracture of one rib of right side [S22.31XA] 01/26/2019     See x ray 01/26/2019   Right anterior lat sub acute chronic      . Enuresis [R32] 09/06/2018   . Chronic left-sided low back pain without sciatica [M54.5, G89.29] 06/21/2018   . Acute left-sided low back pain without sciatica [  M54.5] 03/14/2018   . Greater trochanteric pain syndrome of right lower extremity [M25.551] 11/25/2017   . Complex tear of medial meniscus of left knee as current injury [S83.232A] 09/08/2017   . Primary osteoarthritis of left knee [M17.12] 09/08/2017   . Right lumbar radiculitis [M54.16] 05/05/2017   . 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] 01/11/2017     Osteoarthritis of right hip x ray report - moderate to advanced radiology read 01/11/2017     . 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 Impaired mobility and ADLs [Z74.09] 04/08/2016   . Difficulty in walking, not elsewhere classified [R26.2] 03/12/2016   . Vitamin D deficiency [E55.9] 02/13/2016   . Balance problem [R26.89] 01/23/2016   .  Chronic pain of both shoulders [M25.511, G89.29, M25.512] 01/23/2016     X ray arthritis  02/19/2016       . 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 Essential hypertension [I10] 12/16/2015   . Essential tremor [G25.0] 12/16/2015   . Hx of spontan intraparenchymal intracran bleed assoc with hypertension [Z86.79] 12/16/2015     [place] 4/29- 12/10/2015  Non-traumatic intraparenchymal hemorrhage, hypertensive of the left thalamus   Traumatic hemorrhage   -- falcine subdural hemorrhage, small cortical subarachnoid     . Alcohol abuse [F10.10] 12/16/2015   . 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 History of alcohol dependence (HCC) [F10.21] 06/26/2015   . 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 Idiopathic peripheral neuropathy [G60.9] 03/15/2015   . 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 Tremor [R25.1] 04/20/2013   . Fall [W19.XXXA] 02/23/2013     BP < 100  Occasioanal tri[p and leg weakness     . Back pain, lumbosacral [M54.5] 02/15/2013   . Lumbar radiculopathy, chronic [M54.16] 02/15/2013   . Neck pain [M54.2] 02/15/2013   . Chronic pain [G89.29] 12/21/2012     [facility], [place], [place] Gladebecca Capen    Neck pain -burn c3-7 on left side per patient  Some trigger injection    Lumbar pain-since 80's better with exercise     . Bee sting allergy [Z91.030] 12/21/2012     hives     . Numbness and tingling of leg [R20.0, R20.2] 12/21/2012     Left calf -left foot long term suspect from back-suspect L 45     . Chronic back pain [M54.9, G89.29] 12/21/2012     Mri in mid scape 1/214  Multilevel degenerative disc disease of the lumbar spine.  Circumferential disc bulge at all levels below and including L1-2. No significant central spinal canal stenosis or neuroforaminal narrowing.  2. Mild retrolisthesis of L5 on S1.           . B12 deficiency [E53.8] 12/21/2012     Borderline low 5 /21/2014     . Chronic renal insufficiency, stage III (moderate) (HCC) [N18.3] 12/21/2012     5/ 2013     . Hyperlipidemia [E78.5] 09/26/2012   . Cervicalgia [M54.2] 03/11/2010     Radiofrequency ablation of c3 to c 7          Issues that [name] would like to  address today include lower back pain.    Interval/new treatment and medical events since his last visit includes:   1. Increasing pregabalin to 150mg  TID (he notes no improvement in symptoms with uptitration from 150mg  BID since 04/02/2019 visit)  2. TENS unit (he uses for in morning which "gets me going for the day", he notes this is beneficial).    Other current treatments for his pain include acetaminophen which he takes total of 3000mg  a day, duloxetine 90mg  daily, memantidine 10mg  daily.    The pain is located lower back and radiates down both legs to knees (he claims he has no sensation below knees).  He says back pain is in distribution of L4-S1.  He describes his pain as throbbing, continuous and "like a headache that never goes away [in the low back]". He feels the pain is  gradually worsening.  The pain in his legs is made better by TENS unit, but effect is transient.  The pain is made worse by twisting his back.    Past pain treatments include:   Dr. [name]   03/23/2018 - LEFT L5 TFESI with 80-85%relief for about 3-4 weeksof just axial back pain.   09/15/2017 - Bilateral L5TFESI with 50-75% reliefof mixture of low back and leg pain.   05/19/2017 - RIGHT L5 TFESIwith 100% relief ofRadicularsymptoms for about 3 months   Dr. Amada Jupiter   Injections in our clinic have included (exluding MBB and trigger point injections):   08/12/2016 - L5-S1 ILESI   07/01/2016 - Ultrasound guided right trochanteric  bursa injection    05/20/2016 - Bilateral shoulder injections   05/13/2016 - Right L3 L4 L5/S1 RFA   02/19/2016 - Left L3, L4, L5/S1 RFA   12/27/2012 - L5-S1 IL ESI   08/31/2012 - Left L5 TFESI   03/30/2012 - Left L4/5, L5/S1 facet inj   02/10/2012 - Left C6 C7 MB RFA   07/03/2011 - Left L3, L4, L5/S1 RFA   05/12/2011 - Left C4, C5, C6 RFA    Today patient is interested in epidural or lower-back injections/procedures.    IMAGING  MRI L Spine w/o Contrast - 08/10/2016  EXAMINATION:   MRI L SPINE WO CONT     CLINICAL INDICATION:  Right lumbar radicular symptoms L5, S1, radiating pain in right leg     TECHNIQUE:  MRI Lumbar Spine without contrast : Degenerative (L 1)  Non-contrast:    Sagittal T1, T2, STIR. Axial T1, T stack, T2 angle.     COMPARISON:  Full spine CT on 11/30/2015. Lumbar spine MRI on 01/04/2014     FINDINGS:  ALIGNMENT:  Normal  MARROW: There is a well circumscribed osseous lesion in L3 vertebral body,   demonstrating T1 and T2 hyperintensity, likely representing a benign osseous   hemangioma.  DISCS: Multilevel disc height loss and desiccation.   CORD: Conus ends normally at T12-L1.  Visualized cord and cauda equina are   normal.  PARAVERTEBRAL SOFT TISSUES: Normal     AXIAL DISCS, DURAL COMPRESSION & FORAMINA:  L1-2: Circumferential disc bulge resulting in mild central canal stenosis. No   facet arthropathy or neural foraminal narrowing.  L2-3:  Circumferential disc bulge resulting in mild central canal stenosis. No   neural foraminal narrowing.   L3-4:  Circumferential disc bulge and facet arthropathy resulting in mild   central canal stenosis. There is mild left neural foraminal narrowing. Right   neural foramina is patent.   L4-5:  Circumferential disc bulge with an annular  fissure resulting in mild   central canal stenosis. Bilateral neural foramina are patent.   L5-S1: Circumferential disc bulge resulting in mild central canal stenosis. No   neural foraminal narrowing.      ATTENDING RADIOLOGIST  AND PAGER NUMBER  [phone] Karilyn Cota MD  [phone]  IMPRESSION  IMPRESSION:     Mild multilevel degenerative changes of the lumbar spine, similar compared to   prior exam on 01/04/2014. There are circumferential disc bulges at multiple   levels with associated annular fissure at level L4-L5.     Note: The following findings are so common in people without low back pain   that while we report their presence, they must be interpreted with caution and   in the context of the clinical situation.  (Reference --Jarvik et al, Spine   2001)     Findings (prevalence in patients without low back pain)  Disc degeneration (decreased T2 signal, height loss, bulge) (91%)  Disc T2 -- signal loss (83%)  Disc height loss (56%)  Disc bulge (64%)  Disc protrusion (32%)  Annular tear (38%)       Past Medical History:   Diagnosis Date   . Carotid Sinus Hypersensitivity    . Disc disorder of lumbosacral region    . Hip injury    . HYPERTENSION     . HYPOTENSION     . Intraparenchymal hemorrhage of brain (HCC) 2017   . Pain of right hip joint 01/23/2016   . 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 Syncope    . 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(HCC)    . URI (upper respiratory infection)      Past Surgical History:   Procedure Laterality Date   . ANES; COLONOSCOPY  2002    repeat in 3 years   . ANES; COLONOSCOPY & POLYPECTOMY  11/18/2007    repeat in 3 years   . HALLUX RIGIDUS W/CHEILECTOMY 1ST MP JT W/O IMPLT Right 10/21/2017    Dr. Donn Pierini   . L meniciscus Left 07/2017    by Dr Marcelle Overlie , re did left menisicus    . toe surgery  Right 01/12/2019    Dr Hilarie Fredrickson    . UNLISTED PROCEDURE FEMUR/KNEE     . UNLISTED PROCEDURE HANDS/FINGERS     . UNLISTED PROCEDURE SPINE  2013    rfa to c3 to c 7      Family History     Problem (# of Occurrences) Relation (Name,Age of Onset)    Colon Cancer (1) Father    Heart (other) (1) Mother: MI    Other Family Hx (1) Other: myelodysplasia        Social History      Tobacco Use   . Smoking status: Never Smoker   . Smokeless tobacco: Former Estate agent Use Topics   . Alcohol use: Yes     Past medical, surgical, family, and social history was not  reviewed and updated personally with Mr. Courville.  He reports no other change in past medical history, past surgical history, family history, or social history since his  last visit except as noted above.  Medications and allergies were not also reviewed and confirmed personally.  Current Outpatient Medications   Medication Sig Dispense Refill   . Acetaminophen 500 MG Oral Tab 2 tablets bid     . albuterol HFA 108 (90 Base) MCG/ACT inhaler Inhale 2 puffs by mouth every 4 hours as  needed for shortness of breath/wheezing. (Patient not taking: Reported on 01/13/2019) 1 Inhaler 2   . Alpha-Lipoic Acid 300 MG tablet Take 1 tablet by mouth daily.     [name] amLODIPine 5 MG tablet Take 1 tablet (5 mg) by mouth 2 times a day. 90 tablet 3   . amoxicillin-clavulanate (Augmentin) 500-125 MG tablet Take 1 tablet (500 mg) by mouth every 12 hours for 10 days. 20 tablet 0   . amoxicillin-clavulanate (Augmentin) 500-125 MG tablet Take 1 tablet (500 mg) by mouth every 12 hours. 21 tablet 0   . amoxicillin-clavulanate (Augmentin) 500-125 MG tablet Take 1 tablet (500 mg) by mouth every 12 hours. 20 tablet 0   . atorvastatin 10 MG Oral Tablet Take 1 tablet (10 mg) by mouth daily. 90 tablet 2   . BENFOTIAMINE OR Take 250 mg by mouth daily.     . Cholecalciferol (VITAMIN D3) 2000 units Oral Cap Take 1 capsule (2,000 Units) by mouth daily. For low level 1 capsule 1   . Cyanocobalamin 1000 MCG Oral Tab one per day over-the-counter started 5/21 /2012 this level borderline low 1 Tab 1   . diclofenac 1 % gel      . DULoxetine 30 MG DR capsule Take 1 capsule (30 mg) by mouth daily. 90 capsule 1   . DULoxetine 60 MG DR capsule Take 1 capsule (60 mg) by mouth daily. 90 capsule 1   . EPINEPHrine 0.3 MG/0.3ML Injection Solution Auto-injector Inject as instructed per  patient package insert, 0.3 mg intramuscularly or subcutaneously into the thigh, if needed to treat anaphylaxis 3 each 1   . Lidocaine 5 % External Patch Apply 1 patch onto the skin daily. Apply to painful area for up to 12 hours in a 24 hour period. 30 patch 10   . lidocaine 5 % patch Apply 1 patch onto the skin daily. Apply to painful area for up to 12 hours in a 24 hour period. 30 patch 0   . lisinopril 20 MG tablet Take 1 tablet (20 mg) by mouth every 12 hours. 180 tablet 3   . meloxicam 7.5 MG tablet TAKE ONE TABLET BY MOUTH EVERY MORNING 60 tablet 3   . memantine 10 MG tablet TAKE ONE TABLET BY MOUTH ONCE DAILY 30 tablet 0   . naloxone (Narcan) 4 MG/0.1ML nasal spray Use 1 spray in one nostril for suspected opioid overdose. Call 911. If unresponsive in 2 to 3 minutes, repeat with new naloxone nasal spray. (Patient not taking: Reported on 03/17/2019) 2 each 0   . oxyCODONE 5 MG tablet Take 1 tablet (5 mg) by mouth every 6 hours as needed for severe pain. (Patient not taking: Reported on 04/07/2019) 10 tablet 0   . oxyCODONE 5 MG tablet Take 1 tablet (5 mg) by mouth every 6 hours as needed for severe pain. (Patient not taking: Reported on 04/07/2019) 20 tablet 0   . oxyCODONE 5 MG tablet Take 1 tablet (5 mg) by mouth every 6 hours as needed. (Patient not taking: Reported on 03/02/2019) 20 tablet 0   . oxyCODONE 5 MG tablet Take 1 tablet (5 mg) by mouth every 6 hours as needed for severe pain. (Patient not taking: Reported on 03/02/2019) 20 tablet 0   . oxyCODONE 5 MG tablet Take 1 tablet (5 mg) by mouth every 6 hours as needed for severe pain. (Patient not taking: Reported on 03/17/2019) 30 tablet 0   . pregabalin 100 MG capsule Take 1 capsule (100 mg)  by mouth 2 times a day. (Patient not taking: Reported on 04/14/2019) 180 capsule 1   . pregabalin 50 MG capsule 1 po bid and start 50 mg at lunch per day for one week and inc by 50 mg weekly until 150 at lunch as tolerated 270 capsule 1   . primidone 50 MG tablet Take 1  tablet (50 mg) by mouth 2 times a day. 180 tablet 1   . solifenacin 5 MG tablet Take 1 tablet (5 mg) by mouth daily. 30 tablet 11   . thiamine 100 MG tablet Take 1 tablet by mouth daily.      [name] triamcinolone 0.1 % cream As needed to right ear bid x 1 week 1 Tube 0     No current facility-administered medications for this visit.      [facility] State PMP   Dispensed Written Strength Quantity Refills Days Supply Provider Pharmacy   PREGABALIN 100 MG CAPSULE 03/16/2019 03/16/2019  180 unspecified  90 KRAFT, MD[facility]   PREGABALIN 50 MG CAPSULE 03/16/2019 03/16/2019  249 unspecified  90 KRAFT, MD[facility]   OXYCODONE HCL 5 MG TABLET 02/13/2019 02/13/2019  10 unspecified  5 BLAHOUS, JR, DPM,[facility]   OXYCODONE HCL 5 MG TABLET 02/08/2019 02/08/2019  20 unspecified  5 KIM, DPM,[facility]   OXYCODONE HCL 5 MG TABLET 02/03/2019 02/03/2019  20 unspecified  5 KIM, DPM,[facility]   OXYCODONE HCL 5 MG TABLET 01/24/2019 01/24/2019  20 unspecified  5 KIM, DPM,[facility]   OXYCODONE HCL 5 MG TABLET 01/03/2019 01/03/2019  30 unspecified  7 KIM, DPM[facility]   PREGABALIN 100 MG CAPSULE 12/23/2018 09/13/2018  180 unspecified  90 KRAFT, MD[facility]   PREGABALIN 50 MG CAPSULE 12/23/2018 09/13/2018  180 unspecified  90 KRAFT, MD[facility]   PREGABALIN 50 MG CAPSULE 09/16/2018 09/13/2018  180 unspecified  90 KRAFT[facility]   PREGABALIN 100 MG CAPSULE 09/13/2018 09/13/2018  180 unspecified  90 KRAFT[facility]   PREGABALIN 100 MG CAPSULE 06/17/2018 03/09/2018  180 unspecified  90 KRAFT[facility]   PREGABALIN 50 MG CAPSULE 06/13/2018 03/09/2018  180 unspecified  90 KRAFT[facility]         Review of patient's allergies indicates:  Allergies   Allergen Reactions   . Adhesives Rash   . Bee Venom Hives, Itching and Swelling   . Gabapentin      Flu like  symptoms, diarrhea, body ache upset stomach   . Methocarbamol Other     Patient's wife reports cognitive difficulties ("goofy")         Review of Systems:  The review of systems provided by Mr. Yo and entered in the MA/nursing note was reviewed and verified with Mr. Garms.  Additional comments: NONE.      Physical Examination: BP 126/77   Pulse 87   Wt (!) 245 lb (111.1 kg)   BMI 30.22 kg/m    General: healthy, alert, no distress, relaxed, cooperative  HEENT: Normocephalic, atraumatic. PERRL. EOMI. Pupils appropriate for room lighting   CARDIAC: Regular rate and rhythm. Bilateral radial pulses intact and regular   LUNGS: Unlabored respirations. No accessory muscle use noted Psychiatric  Judgement/insight: Normal;  Psych: Orientation: Normal, Oriented , interactive. Judgement/insight are good. Mood is good. Affect is normal.  MSK: Pain  radiates to bilateral legs above the knee, Right great toe and forefoot is swollen and erythematous with leakage of serous fluid onto sock.  Spinous processes palpated midline, tenderness in L5-S1 lumbar region to palpation, pain in band-like pattern horizontally across back in this area; limited extension of back to 20 degrees; pain on twisting torso left and right Lumbar facet loading positive b/l with significant guarding. Able to rise from seated to standing position without push or delay. Gait mildly antalgic.  Neuro: Strength 5 out of 5 in bilateral proximal lower extremities.  Strength at least antigravity in bilateral upper extremities. Sensation grossly intact in all major dermatomes       Assessment/Discussion  (M47.816) Lumbar facet arthropathy  (primary encounter diagnosis)  (M54.5,  G89.29) Chronic bilateral low back pain without sciatica  (M79.18) Myofascial pain    1.Axial low back pain secondary to facet arthropathy at L4-L5, L5-S1 with prior positive response to RFA (last in 2017) as well as lumbar facet arthropathy  2. R leg/foot pain laterally in  settingmallet toe and peroneal brevis and longus tear diagnosed by podiatry-now s/p R foot 1 MTP arthrodesis  01/12/19 with challenges with wound healing  3. Prior treatment failures including: Short duration of benefit for epidural steroids  4. Medical comorbidities significant formultiple pain syndromes (neck pain, shoulder pain previously, groin/hip pain, prior greater trochanteric pain syndrome, R foot fracture, R lateral malleolus fracture).      Plan:    1. Schedule bilateral diagnostic medial branch block to bilateral L4/L5, L5/S1, as patient has previously responded well to RFA. Will defer any plan for epidural given short-lived benefit in past, lack of true radicular symptoms currently, and desire to avoid steroids in context of poor wound healing of foot.  2. Medications to discuss with his PCP, Macario CarlsVara V Kraft, MD and/or other involved providers: continue current medication regimen unchanged   (pregabalin 150mg  TID)  3. Follow-up: We will call you to schedule a time for your procedure.    Following procedures will plan thorough medication review and determine whether any medications can be discontinued.    Felipa Ethonit Brodin Gelpi, MD

## 2019-04-14 ENCOUNTER — Encounter (HOSPITAL_BASED_OUTPATIENT_CLINIC_OR_DEPARTMENT_OTHER): Payer: Self-pay | Admitting: Anesthesiology

## 2019-04-14 ENCOUNTER — Ambulatory Visit: Payer: PPO | Attending: Anesthesiology | Admitting: Anesthesiology

## 2019-04-14 VITALS — BP 126/77 | HR 87 | Wt 245.0 lb

## 2019-04-14 DIAGNOSIS — M47816 Spondylosis without myelopathy or radiculopathy, lumbar region: Secondary | ICD-10-CM | POA: Insufficient documentation

## 2019-04-14 DIAGNOSIS — G8929 Other chronic pain: Secondary | ICD-10-CM | POA: Insufficient documentation

## 2019-04-14 DIAGNOSIS — M545 Low back pain, unspecified: Secondary | ICD-10-CM

## 2019-04-14 DIAGNOSIS — M7918 Myalgia, other site: Secondary | ICD-10-CM | POA: Insufficient documentation

## 2019-04-14 NOTE — Progress Notes (Signed)
Review of Systems   Constitutional: Negative.    HENT: Negative.    Eyes: Negative.    Respiratory: Negative.    Cardiovascular: Negative.    Gastrointestinal: Negative.    Endocrine: Negative.    Genitourinary: Negative.    Musculoskeletal: Positive for back pain and myalgias.   Skin: Positive for wound.   Allergic/Immunologic: Negative.    Neurological: Negative.    Hematological: Negative.

## 2019-04-14 NOTE — Patient Instructions (Signed)
We will call you to schedule diagnostic medial branch blocks to the lumbar facets.

## 2019-04-14 NOTE — Progress Notes (Signed)
I saw and evaluated the patient with Drs. Chalmette, who conducted the initial history.  I reviewed and confirmed the history in detail with Mr. Denardo in person.   I was present for the examination and formulation portions of the encounter. I agree with the findings and the plan of care as documented in our notes. I did edit the trainee's note.      Marion Downer, MD  Assistant Professor  [facility]

## 2019-04-21 ENCOUNTER — Ambulatory Visit (INDEPENDENT_AMBULATORY_CARE_PROVIDER_SITE_OTHER): Payer: PPO | Admitting: Podiatrist

## 2019-04-21 VITALS — BP 143/92 | HR 93 | Temp 96.5°F

## 2019-04-21 DIAGNOSIS — M2021 Hallux rigidus, right foot: Secondary | ICD-10-CM

## 2019-04-21 DIAGNOSIS — S92421A Displaced fracture of distal phalanx of right great toe, initial encounter for closed fracture: Secondary | ICD-10-CM

## 2019-04-21 DIAGNOSIS — S92421D Displaced fracture of distal phalanx of right great toe, subsequent encounter for fracture with routine healing: Secondary | ICD-10-CM

## 2019-04-21 NOTE — Patient Instructions (Signed)
Ambulatory status:  The patient was instructed to progressive gradual weight bearing as tolerated using stable shoes.

## 2019-04-21 NOTE — Progress Notes (Signed)
Post-Surgery Follow-Up SOAP Note    Patient: Ronnie Mason   Patient DOB: [birth year]     DOS:  04/21/2019     Accompanied by:  spouse    Subjective:  Procedure:ORIFClosed displaced fracture of distal phalanx of right great toe and revision of 1st MTPJ arthrodesisdue to non-compliance of NWB, rightRIGHT  PO week #:10weeks,Date of surgery:02/09/19, 2nd sx, 01/12/19 1st sx    Ronnie Mason is a 68 year old year old malereturn todayfor status postvisit. Patient relates current issues:none.  He is walking in athletic shoes, which he said he modified by stretching big toe area.The patient states ofnopain and open wound has healed. Current instructed post-op weightbearing status is cast boot, but when left for last appt took off boot and started wearing athletic shoe.The patient admits ofnobleeding but has redness.He denies of fever/chill.Patientdenies of side effects of the prescribed medication. Current post-op pain medication is none .The pain is well controlledand patient has stopped taking opioids. Current therapy: no physical therapy at this point. DVT prophylaxis: none - patient is ambulating.    PMH:   Past Medical History:   Diagnosis Date   . Carotid Sinus Hypersensitivity    . Disc disorder of lumbosacral region    . Hip injury    . HYPERTENSION     . HYPOTENSION     . 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 Pain of right hip joint 01/23/2016   . 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 Syncope    . 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([place])    . URI (upper respiratory infection)         Review of patient's allergies indicates:  Allergies   Allergen Reactions   . Adhesives Rash   . Bee Venom Hives, Itching and Swelling   . Gabapentin      Flu like symptoms, diarrhea, body ache upset stomach   . Methocarbamol Other     Patient's wife reports cognitive difficulties ("goofy")        Medications:   Outpatient Medications Prior to  Visit   Medication Sig Dispense Refill   . Acetaminophen 500 MG Oral Tab 2 tablets bid     . albuterol HFA 108 (90 Base) MCG/ACT inhaler Inhale 2 puffs by mouth every 4 hours as needed for shortness of breath/wheezing. (Patient not taking: Reported on 01/13/2019) 1 Inhaler 2   . Alpha-Lipoic Acid 300 MG tablet Take 1 tablet by mouth daily.     [name] amLODIPine 5 MG tablet Take 1 tablet (5 mg) by mouth 2 times a day. 90 tablet 3   . amoxicillin-clavulanate (Augmentin) 500-125 MG tablet Take 1 tablet (500 mg) by mouth every 12 hours. 21 tablet 0   . amoxicillin-clavulanate (Augmentin) 500-125 MG tablet Take 1 tablet (500 mg) by mouth every 12 hours. 20 tablet 0   . atorvastatin 10 MG Oral Tablet Take 1 tablet (10 mg) by mouth daily. 90 tablet 2   . BENFOTIAMINE OR Take 250 mg by mouth daily.     . Cholecalciferol (VITAMIN D3) 2000 units Oral Cap Take 1 capsule (2,000 Units) by mouth daily. For low level 1 capsule 1   . Cyanocobalamin 1000 MCG Oral Tab one per day over-the-counter started 5/21 /2012 this level borderline low 1 Tab 1   . diclofenac 1 % gel      . DULoxetine 30 MG DR capsule Take 1 capsule (30 mg) by mouth daily. 90 capsule 1   .  DULoxetine 60 MG DR capsule Take 1 capsule (60 mg) by mouth daily. 90 capsule 1   . EPINEPHrine 0.3 MG/0.3ML Injection Solution Auto-injector Inject as instructed per patient package insert, 0.3 mg intramuscularly or subcutaneously into the thigh, if needed to treat anaphylaxis 3 each 1   . Lidocaine 5 % External Patch Apply 1 patch onto the skin daily. Apply to painful area for up to 12 hours in a 24 hour period. 30 patch 10   . lidocaine 5 % patch Apply 1 patch onto the skin daily. Apply to painful area for up to 12 hours in a 24 hour period. 30 patch 0   . lisinopril 20 MG tablet Take 1 tablet (20 mg) by mouth every 12 hours. 180 tablet 3   . meloxicam 7.5 MG tablet TAKE ONE TABLET BY MOUTH EVERY MORNING 60 tablet 3   . memantine 10 MG tablet TAKE ONE TABLET BY MOUTH ONCE DAILY 30  tablet 0   . naloxone (Narcan) 4 MG/0.1ML nasal spray Use 1 spray in one nostril for suspected opioid overdose. Call 911. If unresponsive in 2 to 3 minutes, repeat with new naloxone nasal spray. (Patient not taking: Reported on 03/17/2019) 2 each 0   . oxyCODONE 5 MG tablet Take 1 tablet (5 mg) by mouth every 6 hours as needed for severe pain. (Patient not taking: Reported on 04/07/2019) 10 tablet 0   . oxyCODONE 5 MG tablet Take 1 tablet (5 mg) by mouth every 6 hours as needed for severe pain. (Patient not taking: Reported on 04/07/2019) 20 tablet 0   . oxyCODONE 5 MG tablet Take 1 tablet (5 mg) by mouth every 6 hours as needed. (Patient not taking: Reported on 03/02/2019) 20 tablet 0   . oxyCODONE 5 MG tablet Take 1 tablet (5 mg) by mouth every 6 hours as needed for severe pain. (Patient not taking: Reported on 03/02/2019) 20 tablet 0   . oxyCODONE 5 MG tablet Take 1 tablet (5 mg) by mouth every 6 hours as needed for severe pain. (Patient not taking: Reported on 03/17/2019) 30 tablet 0   . pregabalin 100 MG capsule Take 1 capsule (100 mg) by mouth 2 times a day. (Patient not taking: Reported on 04/14/2019) 180 capsule 1   . pregabalin 50 MG capsule 1 po bid and start 50 mg at lunch per day for one week and inc by 50 mg weekly until 150 at lunch as tolerated 270 capsule 1   . primidone 50 MG tablet Take 1 tablet (50 mg) by mouth 2 times a day. 180 tablet 1   . solifenacin 5 MG tablet Take 1 tablet (5 mg) by mouth daily. 30 tablet 11   . thiamine 100 MG tablet Take 1 tablet by mouth daily.      [name]. triamcinolone 0.1 % cream As needed to right ear bid x 1 week 1 Tube 0     No facility-administered medications prior to visit.         Objective:   Vitals:    04/21/19 1130   BP: (!) 143/92   BP Cuff Size: Regular   BP Site: Right Arm   BP Position: Sitting   Pulse: 93   Temp: 96.5 F (35.8 C)        The surgical sites were examined and the incision(s) has/have healed and no dehiscence.  There is no pain on surgical site.  There  is moderated edema and is no erythema noted.  There is  no signs of DVT i.e. no calf pain, swelling and redness.  Toe purchase on stance:  There is no static toe purchase on great toe.  There is no dynamic toe purchase on great toe.      DIAGNOSTIC IMAGING:    Right:  AP/LAT/MO foot (CPT- 57846) views     Finding:   Post-op x-ray:  The 1st metatarso-phalangeal joint and hallux proximal phalanx  fusion and fracture  is/are fused and healed. The fixation is solid and good position - distal screw has slightly backed out but no changes from previous xray. The osseous alignment is good.    Assessment:   Post op 10 weeks to address (N62.952W) Closed displaced fracture of distal phalanx of right great toe, initial encounter  (primary encounter diagnosis)  (M20.21) Hallux rigidus, right foot     The patient is making steady progress post-operatively. The open wound is closed.     Plan:    A discussion took place regarding post-operative progress.  Patient was instructed to call our office if they notice any significant change is post-operative progress and experience severe pain, swelling, fever/chill, calf pain, SOB.  Precaution were stressed today regards to level of activity/elevation/icing.   We have reviewed x-ray finding and detail discussion took place with the patient.      Ambulatory status:  The patient was instructed to progressive gradual weight bearing as tolerated using stable shoes.        Pain management:  The patient was instructed to use rest, ice, elevation, compression and NSAID as primary care for pain and swelling.  At this point we agree to use:  Tylenol.     Return/Follow-up:  follow up in 5-6 weeks and x-ray AP/LAT/ MO views foot    [name] DPM, FACFAS  Reconstructive Ankle and Foot Surgeon  [facility]

## 2019-04-21 NOTE — Progress Notes (Signed)
Post-Surgery Follow-Up SOAP Note    Patient: Ronnie Mason   Patient DOB: 1951-06-22     DOS:  04/21/2019     Accompanied by:  wife    Subjective:    Procedure:ORIFClosed displaced fracture of distal phalanx of right great toe and revision of 1st MTPJ arthrodesisdue to non-compliance of NWB, rightRIGHT  PO week #:10weeks,Date of surgery:02/09/19, 2nd sx, 01/12/19 1st sx    [name] is a 68 year old year old malereturn todayfor status postvisit. Patient relates current issues:none, is walking in athletic shoes, which he said he modified by stretching big toe areaHe states that he move is foot a lot even inboot.The patient states ofnopain, The patient is compliant with post-operative instructions. Current instructed post-op weightbearing status is cast boot, but when left for last appt took off boot and started wearing athletic shoe.The patient admits ofnobleeding but has redness.He denies of fever/chill.Patientdenies of side effects of the prescribed medication. Current post-op pain medication is none .The pain is well controlledand patient has stopped taking opioids. Current therapy: no physical therapy at this point. DVT prophylaxis: none - patient is ambulating.

## 2019-04-24 ENCOUNTER — Telehealth (INDEPENDENT_AMBULATORY_CARE_PROVIDER_SITE_OTHER): Payer: Self-pay | Admitting: Internal Medicine

## 2019-04-24 DIAGNOSIS — G894 Chronic pain syndrome: Secondary | ICD-10-CM

## 2019-04-24 DIAGNOSIS — M542 Cervicalgia: Secondary | ICD-10-CM

## 2019-04-24 NOTE — Telephone Encounter (Signed)
Pended script from fax received.

## 2019-04-25 MED ORDER — DULOXETINE HCL 30 MG OR CPEP
30.0000 mg | DELAYED_RELEASE_CAPSULE | Freq: Every day | ORAL | 1 refills | Status: DC
Start: 2019-04-25 — End: 2019-11-07

## 2019-04-25 MED ORDER — DULOXETINE HCL 60 MG OR CPEP
60.0000 mg | DELAYED_RELEASE_CAPSULE | Freq: Every day | ORAL | 1 refills | Status: DC
Start: 2019-04-25 — End: 2019-11-07

## 2019-04-25 NOTE — Telephone Encounter (Signed)
Done

## 2019-04-25 NOTE — Telephone Encounter (Signed)
Pended Duloxetine 30mg  per [name]'s request.

## 2019-04-25 NOTE — Telephone Encounter (Signed)
Confirm   Is he on 8 + 90 ?

## 2019-04-25 NOTE — Telephone Encounter (Signed)
He takes 60 mg in the morning and 30mg  at night.The refill needs to be for [facility]

## 2019-04-25 NOTE — Telephone Encounter (Signed)
I left a message for Ronnie Mason to call me about his Duloxetine.  Does he only need #60?  Does he take 60mg  plus 30mg ?

## 2019-04-28 ENCOUNTER — Encounter (INDEPENDENT_AMBULATORY_CARE_PROVIDER_SITE_OTHER): Payer: Self-pay | Admitting: Podiatrist

## 2019-05-01 ENCOUNTER — Encounter (HOSPITAL_BASED_OUTPATIENT_CLINIC_OR_DEPARTMENT_OTHER): Payer: Self-pay

## 2019-05-01 NOTE — Telephone Encounter (Signed)
appt made

## 2019-05-03 ENCOUNTER — Encounter (INDEPENDENT_AMBULATORY_CARE_PROVIDER_SITE_OTHER): Payer: Self-pay | Admitting: Podiatrist

## 2019-05-05 ENCOUNTER — Ambulatory Visit: Payer: PPO | Attending: Podiatrist

## 2019-05-05 ENCOUNTER — Ambulatory Visit (INDEPENDENT_AMBULATORY_CARE_PROVIDER_SITE_OTHER): Payer: PPO | Admitting: Podiatrist

## 2019-05-05 DIAGNOSIS — S91301A Unspecified open wound, right foot, initial encounter: Secondary | ICD-10-CM | POA: Insufficient documentation

## 2019-05-05 DIAGNOSIS — M2021 Hallux rigidus, right foot: Secondary | ICD-10-CM

## 2019-05-05 DIAGNOSIS — S92421A Displaced fracture of distal phalanx of right great toe, initial encounter for closed fracture: Secondary | ICD-10-CM

## 2019-05-05 LAB — CBC, DIFF
% Basophils: 1 %
% Eosinophils: 3 %
% Immature Granulocytes: 0 %
% Lymphocytes: 24 %
% Monocytes: 12 %
% Neutrophils: 60 %
% Nucleated RBC: 0 %
Absolute Eosinophil Count: 0.19 10*3/uL (ref 0.00–0.50)
Absolute Lymphocyte Count: 1.58 10*3/uL (ref 1.00–4.80)
Basophils: 0.07 10*3/uL (ref 0.00–0.20)
Hematocrit: 52 % — ABNORMAL HIGH (ref 38–50)
Hemoglobin: 16.7 g/dL (ref 13.0–18.0)
Immature Granulocytes: 0.02 10*3/uL (ref 0.00–0.05)
MCH: 32.1 pg (ref 27.3–33.6)
MCHC: 32.2 g/dL (ref 32.2–36.5)
MCV: 99 fL — ABNORMAL HIGH (ref 81–98)
Monocytes: 0.8 10*3/uL (ref 0.00–0.80)
Neutrophils: 3.96 10*3/uL (ref 1.80–7.00)
Nucleated RBC: 0 10*3/uL
Platelet Count: 248 10*3/uL (ref 150–400)
RBC: 5.21 10*6/uL (ref 4.40–5.60)
RDW-CV: 13.4 % (ref 11.6–14.4)
WBC: 6.62 10*3/uL (ref 4.3–10.0)

## 2019-05-05 LAB — C_REACTIVE PROTEIN: C_Reactive Protein: 8.6 mg/L (ref 0.0–10.0)

## 2019-05-05 LAB — WOUND CULTURE W/GRAM ORDER

## 2019-05-05 NOTE — Patient Instructions (Signed)
apply betadine paint bid with dressing till heel  f/u wound care clinic

## 2019-05-05 NOTE — Progress Notes (Signed)
Accompanied by:  unaccompanied      Maggie Dworkin  is a 68 year old year old male who presents today for re-evaluation.  Patient's C/C - current problem is history of right foot surgery, sores on top of foot now.  The patient states that the current condition is not getting better.   The current condition has existed for 2 week(s).  Patient describes the symptoms as not hurting, but draining.   Patient denies injury, infection.     The condition is worse when pressure is on it.   Past treatments/studies include keeping it dry.   The patient states no improvement.     Patient's limitation is limited in walking.

## 2019-05-05 NOTE — Progress Notes (Signed)
Patient: Ronnie Mason   Patient DOB: 16-May-1951     DOS:  05/05/2019     Accompanied by:  patient was unaccompanied    Subjective:  Ronnie Mason  is a 68 year old year old male who presents today for re-evaluation.  Patient's C/C - current problem is history of right foot surgery, sores on top of foot now.  The patient states that the current condition is not getting better.   The current condition has existed for 2 week(s).  Patient describes the symptoms as not hurting, but draining.   Patient denies injury, infection, fever, chill.   He is wearing tennis shoe and no sandal/flipflop.  The condition is worse when pressure is on it.   Past treatments/studies include keeping it dry.   The patient states no improvement.   Patient's limitation is limited in walking.       Goals: evaluate current condition    PCP:  [name], MD     PMH:   Past Medical History:   Diagnosis Date   . Carotid Sinus Hypersensitivity    . Disc disorder of lumbosacral region    . Hip injury    . HYPERTENSION     . HYPOTENSION     . Intraparenchymal hemorrhage of brain ([facility]) 2017   . Pain of right hip joint 01/23/2016   . 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 Syncope    . 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([facility])    . URI (upper respiratory infection)         Review of patient's allergies indicates:  Allergies   Allergen Reactions   . Adhesives Rash   . Bee Venom Hives, Itching and Swelling   . Gabapentin      Flu like symptoms, diarrhea, body ache upset stomach   . Methocarbamol Other     Patient's wife reports cognitive difficulties ("goofy")        Medications:   Outpatient Medications Prior to Visit   Medication Sig Dispense Refill   . Acetaminophen 500 MG Oral Tab 2 tablets bid     . albuterol HFA 108 (90 Base) MCG/ACT inhaler Inhale 2 puffs by mouth every 4 hours as needed for shortness of breath/wheezing. (Patient not taking: Reported on 01/13/2019) 1 Inhaler 2   . Alpha-Lipoic Acid 300  MG tablet Take 1 tablet by mouth daily.     [name] amLODIPine 5 MG tablet Take 1 tablet (5 mg) by mouth 2 times a day. 90 tablet 3   . amoxicillin-clavulanate (Augmentin) 500-125 MG tablet Take 1 tablet (500 mg) by mouth every 12 hours. 21 tablet 0   . amoxicillin-clavulanate (Augmentin) 500-125 MG tablet Take 1 tablet (500 mg) by mouth every 12 hours. 20 tablet 0   . atorvastatin 10 MG Oral Tablet Take 1 tablet (10 mg) by mouth daily. 90 tablet 2   . BENFOTIAMINE OR Take 250 mg by mouth daily.     . Cholecalciferol (VITAMIN D3) 2000 units Oral Cap Take 1 capsule (2,000 Units) by mouth daily. For low level 1 capsule 1   . Cyanocobalamin 1000 MCG Oral Tab one per day over-the-counter started 5/21 /2012 this level borderline low 1 Tab 1   . diclofenac 1 % gel      . DULoxetine 30 MG DR capsule Take 1 capsule (30 mg) by mouth daily. 90 capsule 1   . DULoxetine 60 MG DR capsule Take 1 capsule (60 mg) by mouth  daily. 90 capsule 1   . EPINEPHrine 0.3 MG/0.3ML Injection Solution Auto-injector Inject as instructed per patient package insert, 0.3 mg intramuscularly or subcutaneously into the thigh, if needed to treat anaphylaxis 3 each 1   . Lidocaine 5 % External Patch Apply 1 patch onto the skin daily. Apply to painful area for up to 12 hours in a 24 hour period. 30 patch 10   . lidocaine 5 % patch Apply 1 patch onto the skin daily. Apply to painful area for up to 12 hours in a 24 hour period. 30 patch 0   . lisinopril 20 MG tablet Take 1 tablet (20 mg) by mouth every 12 hours. 180 tablet 3   . meloxicam 7.5 MG tablet TAKE ONE TABLET BY MOUTH EVERY MORNING 60 tablet 3   . memantine 10 MG tablet TAKE ONE TABLET BY MOUTH ONCE DAILY 30 tablet 0   . naloxone (Narcan) 4 MG/0.1ML nasal spray Use 1 spray in one nostril for suspected opioid overdose. Call 911. If unresponsive in 2 to 3 minutes, repeat with new naloxone nasal spray. 2 each 0   . oxyCODONE 5 MG tablet Take 1 tablet (5 mg) by mouth every 6 hours as needed for severe pain.  (Patient not taking: Reported on 04/07/2019) 10 tablet 0   . oxyCODONE 5 MG tablet Take 1 tablet (5 mg) by mouth every 6 hours as needed for severe pain. (Patient not taking: Reported on 04/07/2019) 20 tablet 0   . oxyCODONE 5 MG tablet Take 1 tablet (5 mg) by mouth every 6 hours as needed. (Patient not taking: Reported on 03/02/2019) 20 tablet 0   . oxyCODONE 5 MG tablet Take 1 tablet (5 mg) by mouth every 6 hours as needed for severe pain. (Patient not taking: Reported on 03/02/2019) 20 tablet 0   . oxyCODONE 5 MG tablet Take 1 tablet (5 mg) by mouth every 6 hours as needed for severe pain. (Patient not taking: Reported on 03/17/2019) 30 tablet 0   . pregabalin 100 MG capsule Take 1 capsule (100 mg) by mouth 2 times a day. (Patient not taking: Reported on 04/14/2019) 180 capsule 1   . pregabalin 50 MG capsule 1 po bid and start 50 mg at lunch per day for one week and inc by 50 mg weekly until 150 at lunch as tolerated 270 capsule 1   . primidone 50 MG tablet Take 1 tablet (50 mg) by mouth 2 times a day. 180 tablet 1   . solifenacin 5 MG tablet Take 1 tablet (5 mg) by mouth daily. 30 tablet 11   . thiamine 100 MG tablet Take 1 tablet by mouth daily.      [name]. triamcinolone 0.1 % cream As needed to right ear bid x 1 week 1 Tube 0     No facility-administered medications prior to visit.         PSH:   Past Surgical History:   Procedure Laterality Date   . ANES; COLONOSCOPY  2002    repeat in 3 years   . ANES; COLONOSCOPY & POLYPECTOMY  11/18/2007    repeat in 3 years   . HALLUX RIGIDUS W/CHEILECTOMY 1ST MP JT W/O IMPLT Right 10/21/2017    Dr. Donn PieriniBrian McInnes   . L meniciscus Left 07/2017    by Dr Marcelle OverlieHolland , re did left menisicus    . toe surgery  Right 01/12/2019    Dr Hilarie Fredricksonony Kim    . UNLISTED PROCEDURE FEMUR/KNEE     .  UNLISTED PROCEDURE HANDS/FINGERS     . UNLISTED PROCEDURE SPINE  2013    rfa to c3 to c 7         Family History:  family history includes Colon Cancer in his father; Heart (other) in his mother; Other Family Hx in an  other family member.    Social History:  Social History     Socioeconomic History   . Marital status: Married     Spouse name: Not on file   . Number of children: Not on file   . Years of education: Not on file   . Highest education level: Not on file   Occupational History   . Not on file   Social Needs   . Financial resource strain: Not on file   . Food insecurity     Worry: Not on file     Inability: Not on file   . Transportation needs     Medical: Not on file     Non-medical: Not on file   Tobacco Use   . Smoking status: Never Smoker   . Smokeless tobacco: Former Chief Strategy Officer and Sexual Activity   . Alcohol use: Yes   . Drug use: No   . Sexual activity: Not on file   Lifestyle   . Physical activity     Days per week: Not on file     Minutes per session: Not on file   . Stress: Not on file   Relationships   . Social Product manager on phone: Not on file     Gets together: Not on file     Attends religious service: Not on file     Active member of club or organization: Not on file     Attends meetings of clubs or organizations: Not on file     Relationship status: Not on file   . Intimate partner violence     Fear of current or ex partner: Not on file     Emotionally abused: Not on file     Physically abused: Not on file     Forced sexual activity: Not on file   Other Topics Concern   . 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  Non-smoker, 14 or more drinks/week almost exclusively wine with dinner.               Physical Exam:   There were no vitals filed for this visit.     General: The patient is White, Well developed, appearing stated age and in no acute distress      Vascular:   RIGHT:  Dorsalis pedis pulse is palpable.  Posterior tibial pulse is palpable. Capillary filling time note to be immediate  on the distal hallux.      Neurologic:  RIGHT:  The sensation is intact on the foot/ankle.  Motor coordination is  intact with normal muscle tone.  There is no manifestations of pathological reflexes noted.      Dermatological:   RIGHT:  There is granulation/open wound 4 x 4 mm on distal incision site/base of hallux and new small open wound - blood blister on proximal end of the incision (2 x 2 mm) opening.  There is no erythema, no odor, cellulitis.       Musculoskeletal:   RIGHT:  There is mild/moderate swelling hallux with no motion on fusion  site.  The hallux is in good position.    DIAGNOSTIC IMAGING:    Right:  AP/LAT/MO foot (CPT- 16109) views     Finding:   Post-op x-ray:  The 1st metatarso-phalangeal joint and hallux proximal phalanx  fusion and fracture  is/are fused and healed. The fixation is solid and good position. The osseous alignment is good.  Today, there is apparent lucency on under that distal plate with slight backing out of distal screw which has not change from prior x-ray.      Component   Ref Range & Units 02/21/19 1140   C_Reactive Protein   0.0 - 10.0 mg/L 17.4High                    Order: [id number]    Status:  Final result Visible to patient:  Yes ([facility]) Next appt:  05/11/2019 at 09:45 AM in Pain Management Renea Ee, MD) Dx:  Closed displaced fracture of distal p...  Component   Ref Range & Units 02/21/19 1140   WBC   4.3 - 10.0 10*3/uL 8.43    RBC   4.40 - 5.60 10*6/uL 4.42    Hemoglobin   13.0 - 18.0 g/dL 19.1    Hematocrit   38 - 50 % 46                Assessment:   (S91.301A) Open wound of right foot, initial encounter    Plan:  Detailed education and discussion took place with the patient regards to open wound, right. The discussion includes the spectrum of treatment and potential complications that includes amputation.  I have express my concern of bone infection (osteomyelitis) which he was on oral abx greater than 6 weeks.  We have discuss peri-hardware festering infection.  Patient was given an opportunity to answer all questions.  We have recommend following options: local  wound care - apply betadine paint bid with dressing till, referral to wound care clinic, blood test (CBC with diff and compare CRP), removal hardware.  After discussing the treatment options, patient elected to try following treatment:  local wound care - apply betadine paint bid with dressing till heel, referral to wound care clinic, blood test (CBC with diff and compare CRP).     I have place referral for wound care clinic at [facility] - Dr. Barb Merino.    Wound debridement:  I have debrided and remove non viable tissue the ulcer using 15 blade and tissue nipper to dermal layer (47829) and dermal layer.  Following thorough debridement, there is good bleeding noted.  The wound was dressing with antibiotic ointment with dressing.  Patient was instructed to continue with current dressing/wound care until heal.        ORDERS:  wound gram strain/culture  right foot. CBC with diff, CRP    We have reviewed x-ray and previous X-ray finding and detail discussion took place with the patient.      Return appointment: follow up in 3-4 weeks    TIME SPENT:  25 minutes was spent face-to-face with patient, greater than 50% of which was spent counseling and coordinating care, and it is separate from procedure - debrided the ulcer. They were given an opportunity to ask questions which were answered in a detailed manner.    [name] DPM, FACFAS  Reconstructive Ankle and Foot Surgeon  [facility]

## 2019-05-07 ENCOUNTER — Other Ambulatory Visit (HOSPITAL_BASED_OUTPATIENT_CLINIC_OR_DEPARTMENT_OTHER): Payer: Self-pay | Admitting: Anesthesiology

## 2019-05-07 DIAGNOSIS — G629 Polyneuropathy, unspecified: Secondary | ICD-10-CM

## 2019-05-08 LAB — WOUND C/S W/GRAM (ANAEROBIC): Gram Smear: NONE SEEN

## 2019-05-08 MED ORDER — MEMANTINE HCL 10 MG OR TABS
ORAL_TABLET | ORAL | 0 refills | Status: DC
Start: 2019-05-08 — End: 2019-06-08

## 2019-05-08 NOTE — Telephone Encounter (Signed)
Medication: memantine 10mg  tablet  Requested by: pharmacy    Last refill written: 03/30/2019  Written by: Dr Lisbeth Ply    Last appointment: 04/14/2019 with Dr. Lisbeth Ply  Scheduled follow up: 05/11/2019 with Dr. Lisbeth Ply      Medication  memantine 10 MG tablet (Order [id number])  Patient Information    Name   Ronnie Mason ([id number]) Identity ID   [id number] Sex   Male DOB   1951-01-22   Medication order tracking    Outpatient Medication Detail    memantine 10 MG tablet        Sig: TAKE ONE TABLET BY MOUTH ONCE DAILY        Sent to pharmacy as: Memantine HCl 10 MG Oral Tablet        Class: Normal        Order: [id number]        E-Prescribing Status: Receipt confirmed by pharmacy (03/30/2019 12:46 PM PDT)        Order Cedric Fishman Method   E-Prescribed [3]   Pharmacy    [facility] [id number] [phone]   Associated Diagnoses:    Neuropathy [G62.9]       Order Providers    Prescribing Provider Encounter Provider   Art Buff, MD Art Buff, MD   Medication Detail    Medication Quantity Refills Start End   memantine 10 MG tablet 30 tablet 0 03/30/2019    Sig: Comment:   TAKE ONE TABLET BY MOUTH ONCE DAILY    Admin Instructions:      Route: PRN Reasons: PRN Comment:        Order Class: Number of Doses: Order #: DAW:   Normal  [id number] No   NDC: LOT #: MAR COMMENT:        Discontinuing user Discontinue Reason Order End Date Order End Time         Order Information    Date and Time Department Ordering/Authorizing   03/30/2019 12:46 PM [facility] [name], Benita Stabile, MD   Electronically signed by    [place] Encounter         [facility] Stock Order Reprint    memantine 10 MG tablet (Order #[id number]) on 03/30/19   340B Eligibility    [id number] [place]

## 2019-05-10 NOTE — Progress Notes (Signed)
DIAGNOSTIC NERVE BLOCK OF LUMBAR FACET JOINTS    PROCEDURE  Lumbar Medial Branch blocks targeting the bilateral L4-L5, L5-S1 lumbar facet joints     PRE-PROCEDURE DIAGNOSIS  bilateral lumbar facet arthropathy    POST-PROCEDURE DIAGNOSIS  bilateral lumbar facet arthropathy    INDICATION FOR INJECTION  [name] has moderate to severe axial pain which impacts his  function and quality of life and has been present for more than 3 months.   His imaging does not reveal another source of pain and is consistent with a facet source for his pain. His physical examination is consistent with a facet source for his pain.   If two injections on two different sessions lead to substantial pain relief and improvement in function, we would conclude that the tested joints are likely a relevant source of his pain. In this case, radiofrequency ablation of the nerves could be offered as a treatment.    ATTENDING PHYSICIAN: Marion Downer, MD    FELLOW / RESIDENT: Vishnu Madapura        SEDATION:  NO    NPO STATUS:  NO    IMAGING TECHNIQUE: Multiplanar fluoroscopic guidance was used for this procedure, appropriate images were saved.     TIME OUT/FINAL VERIFICATION: performed and documented.    POSITION:  Prone    PREP:  ChloraPrep and sterile drapes.  Sterile technique used throughout.     DESCRIPTION OF PROCEDURE    bilateral L4, L5 (medial branch L3, L4)  Imaging for needle placement: An anterior-posterior fluoroscopic image allowed symmetric visualization of the upper and lower endplates; then the beam was tilted oblique to visualize the junction between transverse process and articular process at L4 and L5. Lidocaine 1% was used to anesthetize the skin at each level using a 25 G 2.5 inch needle.   Procedure needle used: 25G, 3.5 inch.  Needle insertion: Down the beam.  Imaging conditions: Good visualization of the target bony structures.  Procedure needle positioned inferior and lateral to the intersection of the transverse  process and pedicle at L4 & L5.  Injection of 0.5 ml of Bupivicaine 0.5% at each level.  Uneventful removal of the needle.    bilateral  S1 (dorsal ramus L5)  Imaging for needle placement: An anterior-posterior fluoroscopic image allowed symmetric visualization of the upper and lower endplates; then the beam was tilted oblique to visualize the sacral alar notch. Lidocaine 1% was used to anesthetize the skin at the injection site using a 25G 2.5 inch needle.  Procedure needle used: 25 G, 3.5 inch.  Needle insertion: Down the beam.  Imaging conditions: Good visualization of the target bony structures  Procedure needle positioned over sacral alar notch.  Injection of 0.4ml of Bupivicaine 0.5% at each level.  Uneventful removal of the needle.    COMPLICATIONS:   None.    POST-PROCEDURE:    He was escorted to the recovery area in stable condition where he  made an uneventful recovery.    Mr. Kervin was reassessed after the procedure.  The pain score was  6/10 before the procedure and 1/10 prior to discharge (0 = no pain, 10 = worst pain imaginable).  Mr. Kimmey did note initial improvement in pain, including pain with movement.     PLAN  Mr. Strohecker  was instructed to keep his  pain diary and report the results of this procedure.  Subsequent care will be based on the results of today's procedure.    I was continuously present for  the entire procedure which was performed under my direct personal supervision (and majority by myself personally).   Imaging guidance was used for this procedure and representative images were saved. I personally viewed these images.

## 2019-05-11 ENCOUNTER — Ambulatory Visit: Payer: PPO | Attending: Anesthesiology | Admitting: Anesthesiology

## 2019-05-11 VITALS — BP 141/90 | HR 90 | Temp 98.1°F | Resp 16

## 2019-05-11 DIAGNOSIS — M47816 Spondylosis without myelopathy or radiculopathy, lumbar region: Secondary | ICD-10-CM | POA: Insufficient documentation

## 2019-05-11 NOTE — Progress Notes (Signed)
Mr. Heal identified by name and DOB.   He confirmed he is having a Bilateral L4-L5 and L5-S1 MBB  He ambulated to procedure area using a cane.  He has not fallen in the last 6 months.  Fall prevention interventions: Patient provided with non-skid stockings  He denies taking any blood thinners.  He denies having a bleeding disorder.  He  Has held his medications as directed on yellow form.  He denies a history of fainting during medical procedures.   He has not been NPO.  He undressed self without assistance.   I verbally reviewed written discharge instructions and pain diary with him.   Confirmed ride home with his daughter [name] in [place]     He has a pre-procedure pain score of 6    [facility] PAIN ASSESSMENT FOR LUMBAR MEDIAL BRANCH BLOCK    TIME Pain Score at rest: Pain Score:  Bend forward   Pain Score:   Bend backward   Pain Score:  Back Bend;twist right Pain Score:  Back Bend; twist left     Pre-Procedure    30 minutes post procedure   6    1 8    1 8     0 7    5 8    4        Pain score prior to procedure:  6/10  Pain score after procedure:  1/10    Mr. Hinchliffe met discharge criteria:  A/OX4/baseline, VSS/returned to baseline, ambulatory with steady gait. Site clean dry and intact.  Written and verbal discharge instructions, including emergency contact phone numbers, reviewed with Mr. Pierron. Verbal understanding obtained from him.=  Mr. Schwinn dressed self without assistance. He was discharged in stable condition, ambulatory with steady gait to home with all belongings and with ride/responsible person.

## 2019-05-11 NOTE — Patient Instructions (Signed)
[facility]  Post-Procedure Diagnostic Block Instructions with Pain Diary    Post-procedure care:  Today, you underwent a procedure that may or may not treat your pain, but should help us identify the source of your pain.  Continue taking your scheduled medications.  If you experience increasing pain, take your regular short-acting medications.    Site care:  You may remove the band-aid after a few hours.  You may shower today. No swimming, tub baths, or hot tubs for 24 hours following your procedure.    Activity recommended during the 1st 6 hours after the procedure:  Continue normal activities, including those that cause the pain.     Do not lie down and rest after the procedure.    Do not drive or operate machinery for 24 hours after your procedure if you were given sedation    Medications:  If you stopped taking any blood thinning medications such as Coumadin or Plavix, you may resume these tomorrow unless specified differently by the prescribing physician.    Follow-up:   You are being sent home with the Patient Self Administered 6 Hour Pain Diary to record your pain scores during the 1st 6 hours after your procedure.  After you have completed your 6 hour pain diary, find the time when you had the lowest pain score meaning the least amount of pain.  Then answer “When I had the lowest pain score= least amount of pain, my pain was relieved by _____%”.    If you have pain relief longer than 6 hours after your procedure, please make note of this on the diary every 4-5 hours until the pain returns.  You do not need to stay up and active during this time.     Call us if you develop any of the following symptoms in the next 7 days:  Fever above 100 degrees F     Any unusual increase in your level of pain  Swelling, bleeding, redness, or increased tenderness at the procedure or IV site    Contact us:  Anytime, 24 hours/day, 7 days/week at [phone]        Patient Self-Administered 6 hour Pain  Diary    1.  For accuracy, keep this diary with you during the 6 hour period so you can write down your pain scores as you go, at the time noted on your diary.  During the 6 hours, please your pain scores while doing activities that provoke your pain as well as when you are not doing those activities.      2.  Within 24 hours of completing this pain score diary, please send us your pain score diary either by:  1)  Scanning into [facility]  2)  Faxing to [phone] or 3)  Calling the pain score voicemail line at [phone].  Leave the spelling of your first and last name,  U#, date of birth, date of service and the 20 numbers you wrote below, including pre-procedure, immediately post procedure and % pain relief.  After that, you may leave any comments regarding your post block experience that may be helpful (e.g. was able to walk, much worse, had to take medications).      Use this scale to rate your pain  0 1 2 3 4 5 6 7 8 9 10  No pain                                                            worst pain     TIME PAIN SCORE WITH PAINFUL ACTIVITY PAIN SCORE WITHOUT PAINFUL ACTIVITY   Before injections:       Immediately after injections:       30 minutes after injections:       1 hour after injections:       2 hours after injections:       3 hours after injections:       4 hours after injections:       5 hours after injections:       6 hours after injections:       When I had the lowest pain score, my % (percentage) pain relief:          IT IS ESSENTIAL YOU CALL IN YOUR PAIN DIARY RESULTS BEFORE ANY FURTHER PROCEDURE(S) CAN BE SCHED

## 2019-05-17 ENCOUNTER — Ambulatory Visit: Payer: PPO | Attending: Podiatrist | Admitting: Podiatrist

## 2019-05-17 VITALS — BP 172/88 | HR 98 | Temp 98.0°F | Resp 18 | Ht 76.0 in | Wt 242.6 lb

## 2019-05-17 DIAGNOSIS — T8131XA Disruption of external operation (surgical) wound, not elsewhere classified, initial encounter: Secondary | ICD-10-CM

## 2019-05-17 DIAGNOSIS — T8131XS Disruption of external operation (surgical) wound, not elsewhere classified, sequela: Secondary | ICD-10-CM | POA: Insufficient documentation

## 2019-05-17 DIAGNOSIS — G629 Polyneuropathy, unspecified: Secondary | ICD-10-CM

## 2019-05-17 DIAGNOSIS — L97512 Non-pressure chronic ulcer of other part of right foot with fat layer exposed: Secondary | ICD-10-CM

## 2019-05-17 DIAGNOSIS — M2021 Hallux rigidus, right foot: Secondary | ICD-10-CM

## 2019-05-17 NOTE — Progress Notes (Signed)
Wound Care Orders:     Right dorsal foot/hallux:     Cleanse NS and pat dry.   Apply skin prep to periwound intact skin.   Fill wound space with fibracol collagen,   Cover with dry gauze and tape to secure.   Single layer compressogrip.   Post operative surgical shoe with felt to offload wound area (patient already has this at home).   Quarter inch u shaped offloading to skin today. Patient to obtain quarter inch adhesive orthopedic felt to continue with offloading.   Change dressing every other day.     Elevate legs 6 times daily.     RTC in 2 weeks for follow up with Dr. Joeseph Amor.

## 2019-05-17 NOTE — Progress Notes (Signed)
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 He has history of hallux rigidus and had been followed by Dr Frederic Jericho and more recently by Dr [name]. Underwent 1stmptj fusion, subsequently fell after not wearing his post-op boot and fractured his proximal phalanx. ORIF of proximal phalanx was done in 02/2019. Patient was initially healing well however in the past several weeks noticed 2 sores on the top of his foot at the area of the incision. They heal over and open again. No pain but some slight drainage noted. Patient denies injury, infection, fever, chill. He is wearing tennis shoe and no sandal/flipflop.  The condition is worse whenpressure is on it. No acute symptoms of infection.       Past Medical History:   Diagnosis Date   . Carotid Sinus Hypersensitivity    . Disc disorder of lumbosacral region    . Hip injury    . HYPERTENSION     . HYPOTENSION     . Intraparenchymal hemorrhage of brain (HCC) 2017   . Pain of right hip joint 01/23/2016   . 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 Syncope    . 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(HCC)    . URI (upper respiratory infection)          FAMILY HISTORY:  Family History     Problem (# of Occurrences) Relation (Name,Age of Onset)    Colon Cancer (1) Father    Heart (other) (1) Mother: MI    Other Family Hx (1) Other: myelodysplasia          SOCIAL HISTORY:  Social History     Socioeconomic History   . Marital status: Married     Spouse name: Not on file   . Number of children: Not on file   . Years of education: Not on file   . Highest education level: Not on file   Occupational History   . Not on file   Social Needs   . Financial resource strain: Not on file    . Food insecurity     Worry: Not on file     Inability: Not on file   . Transportation needs     Medical: Not on file     Non-medical: Not on file   Tobacco Use   . Smoking status: Never Smoker   . Smokeless tobacco: Former Estate agent and Sexual Activity   . Alcohol use: Yes   . Drug use: No   . Sexual activity: Not on file   Lifestyle   . Physical activity     Days per week: Not on file     Minutes per session: Not on file   . Stress: Not on file   Relationships   . Social Wellsite geologist on phone: Not on file     Gets together: Not on file     Attends religious service: Not on file     Active member of club or organization: Not on file     Attends meetings of clubs or organizations: Not on file     Relationship status: Not on file   .  Intimate partner violence     Fear of current or ex partner: Not on file     Emotionally abused: Not on file     Physically abused: Not on file     Forced sexual activity: Not on file   Other Topics Concern   . 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  Non-smoker, 14 or more drinks/week almost exclusively wine with dinner.              Review of patient's allergies indicates:  Allergies   Allergen Reactions   . Adhesives Rash   . Bee Venom Hives, Itching and Swelling   . Gabapentin      Flu like symptoms, diarrhea, body ache upset stomach   . Methocarbamol Other     Patient's wife reports cognitive difficulties ("goofy")       Current Outpatient Medications:   .  Acetaminophen 500 MG Oral Tab, 2 tablets bid, Disp: , Rfl:   .  albuterol HFA 108 (90 Base) MCG/ACT inhaler, Inhale 2 puffs by mouth every 4 hours as needed for shortness of breath/wheezing. (Patient not taking: Reported on 05/17/2019), Disp: 1 Inhaler, Rfl: 2  .  Alpha-Lipoic Acid 300 MG tablet, Take 1 tablet by mouth daily., Disp: , Rfl:   .  amLODIPine 5 MG tablet, Take 1 tablet (5 mg) by mouth 2 times a day., Disp: 90  tablet, Rfl: 3  .  amoxicillin-clavulanate (Augmentin) 500-125 MG tablet, Take 1 tablet (500 mg) by mouth every 12 hours., Disp: 21 tablet, Rfl: 0  .  amoxicillin-clavulanate (Augmentin) 500-125 MG tablet, Take 1 tablet (500 mg) by mouth every 12 hours., Disp: 20 tablet, Rfl: 0  .  atorvastatin 10 MG Oral Tablet, Take 1 tablet (10 mg) by mouth daily., Disp: 90 tablet, Rfl: 2  .  BENFOTIAMINE OR, Take 250 mg by mouth daily., Disp: , Rfl:   .  Cholecalciferol (VITAMIN D3) 2000 units Oral Cap, Take 1 capsule (2,000 Units) by mouth daily. For low level, Disp: 1 capsule, Rfl: 1  .  Cyanocobalamin 1000 MCG Oral Tab, one per day over-the-counter started 5/21 /2012 this level borderline low, Disp: 1 Tab, Rfl: 1  .  diclofenac 1 % gel, , Disp: , Rfl:   .  DULoxetine 30 MG DR capsule, Take 1 capsule (30 mg) by mouth daily., Disp: 90 capsule, Rfl: 1  .  DULoxetine 60 MG DR capsule, Take 1 capsule (60 mg) by mouth daily., Disp: 90 capsule, Rfl: 1  .  EPINEPHrine 0.3 MG/0.3ML Injection Solution Auto-injector, Inject as instructed per patient package insert, 0.3 mg intramuscularly or subcutaneously into the thigh, if needed to treat anaphylaxis, Disp: 3 each, Rfl: 1  .  Lidocaine 5 % External Patch, Apply 1 patch onto the skin daily. Apply to painful area for up to 12 hours in a 24 hour period., Disp: 30 patch, Rfl: 10  .  lidocaine 5 % patch, Apply 1 patch onto the skin daily. Apply to painful area for up to 12 hours in a 24 hour period., Disp: 30 patch, Rfl: 0  .  lisinopril 20 MG tablet, Take 1 tablet (20 mg) by mouth every 12 hours., Disp: 180 tablet, Rfl: 3  .  meloxicam 7.5 MG tablet, TAKE ONE TABLET BY MOUTH EVERY MORNING, Disp: 60 tablet, Rfl: 3  .  memantine 10 MG tablet, TAKE ONE TABLET BY MOUTH ONCE DAILY, Disp:  30 tablet, Rfl: 0  .  naloxone (Narcan) 4 MG/0.1ML nasal spray, Use 1 spray in one nostril for suspected opioid overdose. Call 911. If unresponsive in 2 to 3 minutes, repeat with new naloxone nasal spray.,  Disp: 2 each, Rfl: 0  .  oxyCODONE 5 MG tablet, Take 1 tablet (5 mg) by mouth every 6 hours as needed for severe pain. (Patient not taking: Reported on 04/07/2019), Disp: 10 tablet, Rfl: 0  .  oxyCODONE 5 MG tablet, Take 1 tablet (5 mg) by mouth every 6 hours as needed for severe pain. (Patient not taking: Reported on 04/07/2019), Disp: 20 tablet, Rfl: 0  .  oxyCODONE 5 MG tablet, Take 1 tablet (5 mg) by mouth every 6 hours as needed. (Patient not taking: Reported on 03/02/2019), Disp: 20 tablet, Rfl: 0  .  oxyCODONE 5 MG tablet, Take 1 tablet (5 mg) by mouth every 6 hours as needed for severe pain. (Patient not taking: Reported on 03/02/2019), Disp: 20 tablet, Rfl: 0  .  oxyCODONE 5 MG tablet, Take 1 tablet (5 mg) by mouth every 6 hours as needed for severe pain., Disp: 30 tablet, Rfl: 0  .  pregabalin 100 MG capsule, Take 1 capsule (100 mg) by mouth 2 times a day. (Patient not taking: Reported on 04/14/2019), Disp: 180 capsule, Rfl: 1  .  pregabalin 50 MG capsule, 1 po bid and start 50 mg at lunch per day for one week and inc by 50 mg weekly until 150 at lunch as tolerated, Disp: 270 capsule, Rfl: 1  .  primidone 50 MG tablet, Take 1 tablet (50 mg) by mouth 2 times a day., Disp: 180 tablet, Rfl: 1  .  solifenacin 5 MG tablet, Take 1 tablet (5 mg) by mouth daily., Disp: 30 tablet, Rfl: 11  .  thiamine 100 MG tablet, Take 1 tablet by mouth daily. , Disp: , Rfl:   .  triamcinolone 0.1 % cream, As needed to right ear bid x 1 week, Disp: 1 Tube, Rfl: 0    EXAM:    Vital Signs: BP (!) 172/88   Pulse 98   Temp 98 F (36.7 C) (Oral)   Resp 18   Ht  (1.93 m)   Wt (!) 242 lb 9.6 oz (110 kg)   BMI 29.53 kg/m  Body mass index is 29.53 kg/m.     Edema:    Left Lower:   Left Lower Extremity (LLE)  LLE Calf - Cm w/ Point of Measurement: 37.3 cm  LLE Calf - Cm from the Medial Instep: 34 cm  Right Lower:  Right Lower Extremity (RLE)  RLE Calf - Cm w/ Point of Measurement: 37 cm  RLE Calf - Cm from the Medial Instep: 34  cm    Vascular:  Left Lower:  Left Lower Extremity (LLE)  LLE Capillary Refill: Less than/equal to 3 seconds  LLE Color: Appropriate for ethnicity  LLE Hair Growth: No  LLE Temperature: Warm  L Posterior Tibial Pulse: Moderate  L Pedal Pulse: Moderate  L Foot Neuropathy Assessment: 10/10  Right Lower:   Right Lower Extremity (RLE)  RLE Capillary Refill: Less than/equal to 3 seconds  RLE Color: Pink  RLE Hair Growth: No  RLE Temperature: Warm  R Posterior Tibial Pulse: Moderate  R Pedal Pulse: Moderate  R Foot Neuropathy Assessment: 7/10    Vascular:   RIGHT:  Dorsalis pedis pulse is palpable.  Posterior tibial pulse is palpable. Capillary filling time note to  be immediate  on the distal hallux.      Neurologic:  RIGHT:  The sensation is intact on the foot/ankle.   Decreased protective sensation       Musculoskeletal:   RIGHT:  There is mild/moderate swelling hallux with no motion on fusion site.  The hallux is in good position.      Dermatological:   Dorsal 1st mptj with scar present   1 distal and 1 proximal opening, does not probe deep to bone    Distal has slight undermining at 9oclock position  Proximal has hypertrophic apperance to it.       Wound Data:  Wound 05/17/19 #1Right prox dorsal foot FT with (Active)   Date First Assessed/Time First Assessed: 05/17/19 1443   Date Acquired: 02/09/19  Wound Number: #1Right prox dorsal foot FT with  Primary Wound Type: Incision  Wounding Event: Surgical Injury  Result of an Accident: No  Recurrence: Yes  Clustered Woun...   Number of days: 0       Wound 05/17/19 #2 Right dorsal foot distal FT with (Active)   Date First Assessed/Time First Assessed: 05/17/19 1447   Date Acquired: 02/09/19  Wound Number: #2 Right dorsal foot distal FT with  Primary Wound Type: Incision  Wounding Event: Surgical Injury  Result of an Accident: No  Recurrence: Yes  Clustered W...   Number of days: 0     Wound 05/17/19 #1Right prox dorsal foot FT with (Active)   Thickness Full thickness with  exposed support structure 05/17/19 1446   Wound Length (cm) 0.2 cm 05/17/19 1446   Wound Width (cm) 0.2 cm 05/17/19 1446   Wound Depth (cm) 0.2 cm 05/17/19 1446   Wound Volume (cm^3) 0.01 cm^3 05/17/19 1446   Wound Surface Area (cm^2) 0.04 cm^2 05/17/19 1446   Exudate Amt Small 05/17/19 1446   Exudate Type Serous;Serosanguineous 05/17/19 1446   Foul Odor After Cleansing No 05/17/19 1446   Granulation Amt Large - 67-100% 05/17/19 1446   Necrosis None Present 05/17/19 1446   Slough None Present 05/17/19 1446   Structure Exposed None Present 05/17/19 1446   Margin Distinct, Outline Attached 05/17/19 1446   Tunneling none 05/17/19 1446   Undermining none 05/17/19 1446   Peri-wound Assessment Intact 05/17/19 1446   Temperature No Abnormality 05/17/19 1446   Number of days: 0       Wound 05/17/19 #2 Right dorsal foot distal FT with (Active)   Thickness Full thickness with exposed support structure 05/17/19 1446   Wound Length (cm) 0.1 cm 05/17/19 1446   Wound Width (cm) 0.1 cm 05/17/19 1446   Wound Depth (cm) 0.1 cm 05/17/19 1446   Wound Volume (cm^3) 0 cm^3 05/17/19 1446   Wound Surface Area (cm^2) 0.01 cm^2 05/17/19 1446   Exudate Amt Small 05/17/19 1446   Exudate Type Serous 05/17/19 1446   Foul Odor After Cleansing No 05/17/19 1446   Granulation Amt Small - 1-33% 05/17/19 1446   Necrosis None Present 05/17/19 1446   Slough None Present 05/17/19 1446   Structure Exposed None Present 05/17/19 1446   Margin Distinct, Outline Attached 05/17/19 1446   Tunneling none 05/17/19 1446   Undermining none 05/17/19 1446   Peri-wound Assessment Intact 05/17/19 1446   Temperature No Abnormality 05/17/19 1446   Number of days: 0                     ASSESSMENT/PLAN:   68yo male with Right hallux rigidus  with post-op wound complications with ulceration     -discussed with patient that this maybe a reaction to the metal in the screws or possibly from sutures(which is less likely). Advised him that we will plan on switching him to silver  nitrate and collagen dressings. If no improvement in next several weeks will consider surgical hardware removal with Dr Selena BattenKim. Per his xray and clinical picture does not to look for have hardware infection or osteomyelitis  -debridement of wounds, packed distal end with collagen and proximal hypergranular tissue cauterized with silver nitrate   -post-op shoe dispensed with felt offloading padding to prevent dorsal friction   -compression for edema control  -referral to vascular lab for baseline studies   -educated on signs and symptoms of infection       F/u in 2 weeks               PROCEDURE  Wound/Ulcer: # Right great toe   Indication for Debridement: abnormal wound base and slough, exudate  Anesthetic: None  Type and Level of Debridement:  -Surgical/Excisional: : subcutaneous tissue  Tissue and/or Material Removed: exudate and fibrin/slough  Severity: fat layer exposed  Instruments Used: curette  Bleeding: minimal  Bleeding Controlled with: pressure  Actual Area Debrided   -Total Square cm2 = 0.04cm^2  Character of Wound/Ulcer Post-Debridement and Post-Debridement Measurements: Improved:   Length 0.2cm X Width 0.2cm X Depth: 0.1cm proximal  0.2cm x 0.2cm x 0.2cm x with 0.2cm undermining distal       Patient tolerated the procedure well.   The wound looked improved after the debridement      Patient Instructions   Wound Care Orders:     Right dorsal foot/hallux:     Cleanse NS and pat dry.   Apply skin prep to periwound intact skin.   Fill wound space with fibracol collagen,   Cover with dry gauze and tape to secure.   Single layer compressogrip.   Post operative surgical shoe with felt to offload wound area (patient already has this at home).   Quarter inch u shaped offloading to skin today. Patient to obtain quarter inch adhesive orthopedic felt to continue with offloading.   Change dressing every other day.     Elevate legs 6 times daily.     RTC in 2 weeks for follow up with Dr. Barb MerinoMahmood.             Return in  about 2 weeks (around 05/31/2019) for routine follow-up.    TIME ATTESTATION  Total time of 25  minutes  was spent face to face with the patient, of which   >50% was spent counseling  coordinating the patient's care as outlined above and 5 minutes was spend on procedure total of 30 mins      Zenaida DeedSara Mahmood, [place]                                                          Subjective:   Aleda GranaDavid Paul Fort is a 68 year old year old male who presents today for re-evaluation. Patient's C/C - current problem is history of right foot surgery, sores on top of foot now. The patient states that the current condition is not getting better. The current condition has existed for 2 week(s). Patient describes the symptoms as  not hurting, but draining. Patient denies injury, infection, fever, chill. He is wearing tennis shoe and no sandal/flipflop. The condition is worse when pressure is on it. Past treatments/studies include keeping it dry. The patient states no improvement. Patient's limitation is limited in walking.   Goals: evaluate current condition   PCP: [name], MD   PMH:   Medical History                                                                                    Review of patient's allergies indicates:         Allergies   Allergen Reactions   . Adhesives Rash   . Bee Venom Hives, Itching and Swelling   . Gabapentin      Flu like symptoms, diarrhea, body ache upset stomach   . Methocarbamol Other     Patient's wife reports cognitive difficulties ("goofy")   Medications:          Outpatient Medications Prior to Visit   Medication Sig Dispense Refill   . Acetaminophen 500 MG Oral Tab 2 tablets bid     . albuterol HFA 108 (90 Base) MCG/ACT inhaler Inhale 2 puffs by mouth every 4 hours as needed for shortness of breath/wheezing. (Patient not taking: Reported on 01/13/2019) 1 Inhaler 2   . Alpha-Lipoic Acid 300 MG tablet Take 1 tablet by mouth daily.     [name] amLODIPine 5 MG tablet Take 1 tablet (5 mg) by mouth 2 times  a day. 90 tablet 3   . amoxicillin-clavulanate (Augmentin) 500-125 MG tablet Take 1 tablet (500 mg) by mouth every 12 hours. 21 tablet 0   . amoxicillin-clavulanate (Augmentin) 500-125 MG tablet Take 1 tablet (500 mg) by mouth every 12 hours. 20 tablet 0   . atorvastatin 10 MG Oral Tablet Take 1 tablet (10 mg) by mouth daily. 90 tablet 2   . BENFOTIAMINE OR Take 250 mg by mouth daily.     . Cholecalciferol (VITAMIN D3) 2000 units Oral Cap Take 1 capsule (2,000 Units) by mouth daily. For low level 1 capsule 1   . Cyanocobalamin 1000 MCG Oral Tab one per day over-the-counter started 5/21 /2012 this level borderline low 1 Tab 1   . diclofenac 1 % gel      . DULoxetine 30 MG DR capsule Take 1 capsule (30 mg) by mouth daily. 90 capsule 1   . DULoxetine 60 MG DR capsule Take 1 capsule (60 mg) by mouth daily. 90 capsule 1   . EPINEPHrine 0.3 MG/0.3ML Injection Solution Auto-injector Inject as instructed per patient package insert, 0.3 mg intramuscularly or subcutaneously into the thigh, if needed to treat anaphylaxis 3 each 1   . Lidocaine 5 % External Patch Apply 1 patch onto the skin daily. Apply to painful area for up to 12 hours in a 24 hour period. 30 patch 10   . lidocaine 5 % patch Apply 1 patch onto the skin daily. Apply to painful area for up to 12 hours in a 24 hour period. 30 patch 0   . lisinopril 20 MG tablet Take 1 tablet (20 mg) by mouth every 12 hours. 180 tablet  3   . meloxicam 7.5 MG tablet TAKE ONE TABLET BY MOUTH EVERY MORNING 60 tablet 3   . memantine 10 MG tablet TAKE ONE TABLET BY MOUTH ONCE DAILY 30 tablet 0   . naloxone (Narcan) 4 MG/0.1ML nasal spray Use 1 spray in one nostril for suspected opioid overdose. Call 911. If unresponsive in 2 to 3 minutes, repeat with new naloxone nasal spray. 2 each 0   . oxyCODONE 5 MG tablet Take 1 tablet (5 mg) by mouth every 6 hours as needed for severe pain. (Patient not taking: Reported on 04/07/2019) 10 tablet 0   . oxyCODONE 5 MG tablet Take 1 tablet (5 mg) by  mouth every 6 hours as needed for severe pain. (Patient not taking: Reported on 04/07/2019) 20 tablet 0   . oxyCODONE 5 MG tablet Take 1 tablet (5 mg) by mouth every 6 hours as needed. (Patient not taking: Reported on 03/02/2019) 20 tablet 0   . oxyCODONE 5 MG tablet Take 1 tablet (5 mg) by mouth every 6 hours as needed for severe pain. (Patient not taking: Reported on 03/02/2019) 20 tablet 0   . oxyCODONE 5 MG tablet Take 1 tablet (5 mg) by mouth every 6 hours as needed for severe pain. (Patient not taking: Reported on 03/17/2019) 30 tablet 0   . pregabalin 100 MG capsule Take 1 capsule (100 mg) by mouth 2 times a day. (Patient not taking: Reported on 04/14/2019) 180 capsule 1   . pregabalin 50 MG capsule 1 po bid and start 50 mg at lunch per day for one week and inc by 50 mg weekly until 150 at lunch as tolerated 270 capsule 1   . primidone 50 MG tablet Take 1 tablet (50 mg) by mouth 2 times a day. 180 tablet 1   . solifenacin 5 MG tablet Take 1 tablet (5 mg) by mouth daily. 30 tablet 11   . thiamine 100 MG tablet Take 1 tablet by mouth daily.      [name] triamcinolone 0.1 % cream As needed to right ear bid x 1 week 1 Tube 0     No facility-administered medications prior to visit.    PSH:   Past Surgical History                                                                                                          Family History:   family history includes Colon Cancer in his father; Heart (other) in his mother; Other Family Hx in an other family member.   Social History:   Social History           Socioeconomic History   . Marital status: Married     Spouse name: Not on file   . Number of children: Not on file   . Years of education: Not on file   . Highest education level: Not on file   Occupational History   . Not on file   Social Needs   . Financial resource strain: Not on file   . Food  insecurity     Worry: Not on file     Inability: Not on file   . Transportation needs     Medical: Not on file     Non-medical: Not on  file   Tobacco Use   . Smoking status: Never Smoker   . Smokeless tobacco: Former Chief Strategy Officer and Sexual Activity   . Alcohol use: Yes   . Drug use: No   . Sexual activity: Not on file   Lifestyle   . Physical activity     Days per week: Not on file     Minutes per session: Not on file   . Stress: Not on file   Relationships   . Social Product manager on phone: Not on file     Gets together: Not on file     Attends religious service: Not on file     Active member of club or organization: Not on file     Attends meetings of clubs or organizations: Not on file     Relationship status: Not on file   . Intimate partner violence     Fear of current or ex partner: Not on file     Emotionally abused: Not on file     Physically abused: Not on file     Forced sexual activity: Not on file   Other Topics Concern   . 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 Non-smoker, 14 or more drinks/week almost exclusively wine with dinner.        Physical Exam:   Vitals      General: The patient is White, Well developed, appearing stated age and in no acute distress   Vascular:   RIGHT: Dorsalis pedis pulse is palpable. Posterior tibial pulse is palpable. Capillary filling time note to be immediate on the distal hallux.   Neurologic:   RIGHT: The sensation is intact on the foot/ankle. Motor coordination is intact with normal muscle tone. There is no manifestations of pathological reflexes noted.   Dermatological:   RIGHT: There is granulation/open wound 4 x 4 mm on distal incision site/base of hallux and new small open wound - blood blister on proximal end of the incision (2 x 2 mm) opening. There is no erythema, no odor, cellulitis.   Musculoskeletal:   RIGHT: There is mild/moderate swelling hallux with no motion on fusion site. The hallux is in good position.   DIAGNOSTIC IMAGING:   Right: AP/LAT/MO foot (CPT- 86578) views   Finding:    Post-op x-ray: The 1st metatarso-phalangeal joint and hallux proximal phalanx fusion and fracture is/are fused and healed. The fixation is solid and good position. The osseous alignment is good. Today, there is apparent lucency on under that distal plate with slight backing out of distal screw which has not change from prior x-ray.        Component   Ref Range & Units 02/21/19 1140   C_Reactive Protein   0.0 - 10.0 mg/L 17.4High                Order: [id number]     Status: Final result Visible to patient: Yes ([facility]) Next appt: 05/11/2019 at 09:45 AM in Pain Management Art Buff, MD) Dx: Closed displaced fracture of distal p...   Component   Ref Range & Units 02/21/19 1140   WBC   4.3 - 10.0  10*3/uL 8.43    RBC   4.40 - 5.60 10*6/uL 4.42    Hemoglobin   13.0 - 18.0 g/dL 16.1    Hematocrit   38 - 50 % 46    Assessment:   (S91.301A) Open wound of right foot, initial encounter   Plan:   Detailed education and discussion took place with the patient regards to open wound, right. The discussion includes the spectrum of treatment and potential complications that includes amputation. I have express my concern of bone infection (osteomyelitis) which he was on oral abx greater than 6 weeks. We have discuss peri-hardware festering infection. Patient was given an opportunity to answer all questions. We have recommend following options: local wound care - apply betadine paint bid with dressing till, referral to wound care clinic, blood test (CBC with diff and compare CRP), removal hardware. After discussing the treatment options, patient elected to try following treatment: local wound care - apply betadine paint bid with dressing till heel, referral to wound care clinic, blood test (CBC with diff and compare CRP).   I have place referral for wound care clinic at [facility] - Dr. Barb Merino.   Wound debridement: I have debrided and remove non viable tissue the ulcer using 15 blade and tissue nipper to dermal layer (09604) and  dermal layer. Following thorough debridement, there is good bleeding noted. The wound was dressing with antibiotic ointment with dressing. Patient was instructed to continue with current dressing/wound care until heal.   ORDERS:   wound gram strain/culture right foot. CBC with diff, CRP   We have reviewed x-ray and previous X-ray finding and detail discussion took place with the patient.   Return appointment: follow up in 3-4 weeks

## 2019-05-17 NOTE — Patient Instructions (Signed)
Wound Care Orders:     Right dorsal foot/hallux:     Cleanse NS and pat dry.   Apply skin prep to periwound intact skin.   Fill wound space with fibracol collagen,   Cover with dry gauze and tape to secure.   Single layer compressogrip.   Post operative surgical shoe with felt to offload wound area (patient already has this at home).   Quarter inch u shaped offloading to skin today. Patient to obtain quarter inch adhesive orthopedic felt to continue with offloading.   Change dressing every other day.     Elevate legs 6 times daily.     RTC in 2 weeks for follow up with Dr. Mahmood.

## 2019-05-21 ENCOUNTER — Other Ambulatory Visit: Payer: Self-pay

## 2019-05-23 ENCOUNTER — Encounter (INDEPENDENT_AMBULATORY_CARE_PROVIDER_SITE_OTHER): Payer: Self-pay | Admitting: Internal Medicine

## 2019-05-23 DIAGNOSIS — G609 Hereditary and idiopathic neuropathy, unspecified: Secondary | ICD-10-CM

## 2019-05-23 MED ORDER — PREGABALIN 100 MG OR CAPS
100.0000 mg | ORAL_CAPSULE | Freq: Three times a day (TID) | ORAL | 1 refills | Status: DC
Start: 2019-05-23 — End: 2019-12-14

## 2019-05-23 MED ORDER — PREGABALIN 50 MG OR CAPS
ORAL_CAPSULE | ORAL | 1 refills | Status: DC
Start: 2019-05-23 — End: 2019-12-14

## 2019-05-23 NOTE — Telephone Encounter (Signed)
The patient stopped by. He takes 3X150 per day , total 450 per day. He's out of the 50 MG's. ( He has 100 MG's).    Please call in the 50's at [facility] in [employer]

## 2019-05-23 NOTE — Telephone Encounter (Signed)
Pended script

## 2019-05-23 NOTE — Telephone Encounter (Signed)
Done

## 2019-05-24 NOTE — Progress Notes (Signed)
DIAGNOSTIC NERVE BLOCK OF LUMBAR FACET JOINTS #2    PROCEDURE  Lumbar Medial Branch blocks targeting the bilateral L4-L5, L5-S1 lumbar facet joints     PRE-PROCEDURE DIAGNOSIS  bilateral lumbar facet arthropathy    POST-PROCEDURE DIAGNOSIS  bilateral lumbar facet arthropathy    INDICATION FOR INJECTION  Mr. Maldonado has moderate to severe axial pain which impacts his  function and quality of life and has been present for more than 3 months.   His imaging does not reveal another source of pain and is consistent with a facet source for his pain. His physical examination is consistent with a facet source for his pain.   If two injections on two different sessions lead to substantial pain relief and improvement in function, we would conclude that the tested joints are likely a relevant source of his pain. In this case, radiofrequency ablation of the nerves could be offered as a treatment.    ATTENDING PHYSICIAN: Marion Downer, MD    FELLOW / RESIDENT: Claudie Fisherman, MD        SEDATION:  NO    NPO STATUS:  NO    IMAGING TECHNIQUE: Multiplanar fluoroscopic guidance was used for this procedure, appropriate images were saved.     TIME OUT/FINAL VERIFICATION: performed and documented.    POSITION:  Prone    PREP:  ChloraPrep and sterile drapes.  Sterile technique used throughout.     DESCRIPTION OF PROCEDURE    bilateral L4, L5 (medial branch L3, L4)  Imaging for needle placement: An anterior-posterior fluoroscopic image allowed symmetric visualization of the upper and lower endplates; then the beam was tilted oblique to visualize the junction between transverse process and articular process at L4 and L5. Lidocaine 1% was used to anesthetize the skin at each level using a 25 G 2.5 inch needle.   Procedure needle used: 25G, 3.5 inch.  Needle insertion: Down the beam.  Imaging conditions: Good visualization of the target bony structures.  Procedure needle positioned inferior and lateral to the intersection of the  transverse process and pedicle at L4 & L5.  Injection of 0.5 ml of Lidocaine 2% at each level.  Uneventful removal of the needle.    bilateral  S1 (dorsal ramus L5)  Imaging for needle placement: An anterior-posterior fluoroscopic image allowed symmetric visualization of the upper and lower endplates; then the beam was tilted oblique to visualize the sacral alar notch. Lidocaine 1% was used to anesthetize the skin at the injection site using a 25G 2.5 inch needle.  Procedure needle used: 25 G, 3.5 inch.  Needle insertion: Down the beam.  Imaging conditions: Good visualization of the target bony structures  Procedure needle positioned over sacral alar notch.  Injection of 0.5 ml of Lidocaine 2% at each level.  Uneventful removal of the needle.    COMPLICATIONS:   None.    POST-PROCEDURE:    He was escorted to the recovery area in stable condition where he  made an uneventful recovery.    Mr. Rodriques was reassessed after the procedure.  The pain score was  5/10 before the procedure and 2/10 prior to discharge (0 = no pain, 10 = worst pain imaginable).  Mr. Tout did note initial improvement in pain, including pain with movement.     PLAN  Mr. Zoeller  was instructed to keep his  pain diary and report the results of this procedure.  Subsequent care will be based on the results of today's procedure.  I was present for the entire procedure (bilateral medial branch blocks) which was performed under my direct personal supervision. I wrote the procedure note.      [name], MD  Assistant Professor  Department of Anesthesiology and Pain Medicine  [facility]        [name], MD  Assistant Professor  [facility]

## 2019-05-25 ENCOUNTER — Ambulatory Visit: Payer: PPO | Attending: Anesthesiology | Admitting: Anesthesiology

## 2019-05-25 VITALS — BP 138/93 | HR 95 | Temp 98.2°F | Resp 16

## 2019-05-25 DIAGNOSIS — M47816 Spondylosis without myelopathy or radiculopathy, lumbar region: Secondary | ICD-10-CM | POA: Insufficient documentation

## 2019-05-25 NOTE — Patient Instructions (Signed)
[facility]  Post-Procedure Diagnostic Block Instructions with Pain Diary    Post-procedure care:  Today, you underwent a procedure that may or may not treat your pain, but should help us identify the source of your pain.  Continue taking your scheduled medications.  If you experience increasing pain, take your regular short-acting medications.    Site care:  You may remove the band-aid after a few hours.  You may shower today. No swimming, tub baths, or hot tubs for 24 hours following your procedure.    Activity recommended during the 1st 6 hours after the procedure:  Continue normal activities, including those that cause the pain.     Do not lie down and rest after the procedure.    Do not drive or operate machinery for 24 hours after your procedure if you were given sedation    Medications:  If you stopped taking any blood thinning medications such as Coumadin or Plavix, you may resume these tomorrow unless specified differently by the prescribing physician.    Follow-up:   You are being sent home with the Patient Self Administered 6 Hour Pain Diary to record your pain scores during the 1st 6 hours after your procedure.  After you have completed your 6 hour pain diary, find the time when you had the lowest pain score meaning the least amount of pain.  Then answer “When I had the lowest pain score= least amount of pain, my pain was relieved by _____%”.    If you have pain relief longer than 6 hours after your procedure, please make note of this on the diary every 4-5 hours until the pain returns.  You do not need to stay up and active during this time.     Call us if you develop any of the following symptoms in the next 7 days:  Fever above 100 degrees F     Any unusual increase in your level of pain  Swelling, bleeding, redness, or increased tenderness at the procedure or IV site    Contact us:  Anytime, 24 hours/day, 7 days/week at [phone]        Patient Self-Administered 6 hour Pain  Diary    1.  For accuracy, keep this diary with you during the 6 hour period so you can write down your pain scores as you go, at the time noted on your diary.  During the 6 hours, please your pain scores while doing activities that provoke your pain as well as when you are not doing those activities.      2.  Within 24 hours of completing this pain score diary, please send us your pain score diary either by:  1)  Scanning into [facility]  2)  Faxing to [phone] or 3)  Calling the pain score voicemail line at [phone].  Leave the spelling of your first and last name,  U#, date of birth, date of service and the 20 numbers you wrote below, including pre-procedure, immediately post procedure and % pain relief.  After that, you may leave any comments regarding your post block experience that may be helpful (e.g. was able to walk, much worse, had to take medications).      Use this scale to rate your pain  0 1 2 3 4 5 6 7 8 9 10  No pain                                                            worst pain     TIME PAIN SCORE WITH PAINFUL ACTIVITY PAIN SCORE WITHOUT PAINFUL ACTIVITY   Before injections:       Immediately after injections:       30 minutes after injections:       1 hour after injections:       2 hours after injections:       3 hours after injections:       4 hours after injections:       5 hours after injections:       6 hours after injections:       When I had the lowest pain score, my % (percentage) pain relief:          IT IS ESSENTIAL YOU CALL IN YOUR PAIN DIARY RESULTS BEFORE ANY FURTHER PROCEDURE(S) CAN BE SCHED

## 2019-05-25 NOTE — Progress Notes (Signed)
MANEUVERS PAIN ASSESSMENT FOR LUMBAR MEDIAL BRANCH BLOCK    TIME Pain Score at rest: Pain Score:  Bend forward   Pain Score:   Bend backward   Pain Score:  Back Bend;twist right Pain Score:  Back Bend; twist left       30 minutes post procedure     2   0   Pt did not want to do this maneuver    3   3     Pain score prior to procedure: 5/10  Pain score after procedure:  2/10    Mr. Kerwin met discharge criteria:  A/OX4/baseline, VSS/returned to baseline, ambulatory with steady gait. Site clean dry and intact.  Written and verbal discharge instructions, including emergency contact phone numbers, reviewed with Mr. Cumpton. Verbal understanding obtained from him.  Mr. Kunin dressed self without assistance. He was discharged in stable condition, ambulatory with steady gait to home with all belongings and with ride/responsible person.

## 2019-05-25 NOTE — Progress Notes (Signed)
Ronnie Mason identified by name and DOB.   He confirmed he is having a Bilateral L4-L5 and L5-S1 MBB Series of 2, 2 of 2  He ambulated to procedure area using a cane.  He has not fallen in the last 6 months.  Fall prevention interventions: Patient provided with non-skid stockings  He denies taking any blood thinners.  He denies having a bleeding disorder.  He  Has held his medications as directed on yellow form.  He denies a history of fainting during medical procedures.   He has not been NPO.  He undressed self without assistance.   I verbally reviewed written discharge instructions and pain diary with him.   Confirmed ride home with his daughter, Ronnie Mason, in [place]     He has a pre-procedure pain score of 5/10      MANEUVERS PAIN ASSESSMENT FOR LUMBAR MEDIAL BRANCH BLOCK    TIME Pain Score at rest: Pain Score:  Bend forward   Pain Score:   Bend backward   Pain Score:  Back Bend;twist right Pain Score:  Back Bend; twist left     Pre-Procedure   5 0 8 7 7

## 2019-05-31 ENCOUNTER — Ambulatory Visit (HOSPITAL_BASED_OUTPATIENT_CLINIC_OR_DEPARTMENT_OTHER): Payer: PPO | Admitting: Podiatrist

## 2019-05-31 ENCOUNTER — Ambulatory Visit (INDEPENDENT_AMBULATORY_CARE_PROVIDER_SITE_OTHER): Payer: PPO | Admitting: Podiatrist

## 2019-05-31 VITALS — BP 150/80 | HR 80 | Temp 98.1°F | Resp 18 | Wt 242.8 lb

## 2019-05-31 VITALS — BP 133/85 | HR 93 | Temp 96.9°F

## 2019-05-31 DIAGNOSIS — S91301A Unspecified open wound, right foot, initial encounter: Secondary | ICD-10-CM

## 2019-05-31 DIAGNOSIS — G629 Polyneuropathy, unspecified: Secondary | ICD-10-CM

## 2019-05-31 DIAGNOSIS — T8131XS Disruption of external operation (surgical) wound, not elsewhere classified, sequela: Secondary | ICD-10-CM

## 2019-05-31 DIAGNOSIS — M2021 Hallux rigidus, right foot: Secondary | ICD-10-CM

## 2019-05-31 DIAGNOSIS — L97512 Non-pressure chronic ulcer of other part of right foot with fat layer exposed: Secondary | ICD-10-CM

## 2019-05-31 NOTE — Progress Notes (Signed)
Patient: Ronnie Mason   Patient DOB: 03/21/1951     DOS:  05/31/2019     Accompanied by:  patient was unaccompanied    Subjective:  Procedure:ORIFClosed displaced fracture of distal phalanx of right great toe and revision of 1st MTPJ arthrodesisdue to non-compliance of NWB, rightRIGHT  PO week  19 weeks post op from original surgery,Date of surgery:02/09/19, 2nd sx, 01/12/19 1st sx    Ronnie Mason is a 68 year old year old malereturn todayfor follow up status post surgery. Patient relates current issues:following with Dr Barb Merino for wound care which is help.  Heis walking in athletic shoes, which he said he modified by stretching big toe area.The patient states ofnopainand open wound is healing but not as good as he hoped. Current instructed post-op weightbearing status The patient admits ofnobleedingbut has redness, looks a little angry. He denies of fever/chill.Patientdenies of side effects of the prescribed medication. Current post-op pain medication is none.The pain is well controlledand patient has stopped taking opioids. Current therapy: no physical therapy at this point. DVT prophylaxis: none - patient is ambulating.    Goals: evaluate current condition    PCP:  [name], MD     PMH:   Past Medical History:   Diagnosis Date   . Carotid Sinus Hypersensitivity    . Disc disorder of lumbosacral region    . Hip injury    . HYPERTENSION     . HYPOTENSION     . Intraparenchymal hemorrhage of brain (HCC) 2017   . Pain of right hip joint 01/23/2016   . 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 Syncope    . 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(HCC)    . URI (upper respiratory infection)         Review of patient's allergies indicates:  Allergies   Allergen Reactions   . Adhesives Rash   . Bee Venom Hives, Itching and Swelling   . Gabapentin      Flu like symptoms, diarrhea, body ache upset stomach   . Methocarbamol Other      Patient's wife reports cognitive difficulties ("goofy")        Medications:   Outpatient Medications Prior to Visit   Medication Sig Dispense Refill   . Acetaminophen 500 MG Oral Tab 2 tablets bid     . albuterol HFA 108 (90 Base) MCG/ACT inhaler Inhale 2 puffs by mouth every 4 hours as needed for shortness of breath/wheezing. (Patient not taking: Reported on 05/17/2019) 1 Inhaler 2   . Alpha-Lipoic Acid 300 MG tablet Take 1 tablet by mouth daily.     [name] amLODIPine 5 MG tablet Take 1 tablet (5 mg) by mouth 2 times a day. 90 tablet 3   . amoxicillin-clavulanate (Augmentin) 500-125 MG tablet Take 1 tablet (500 mg) by mouth every 12 hours. (Patient not taking: Reported on 05/25/2019) 21 tablet 0   . amoxicillin-clavulanate (Augmentin) 500-125 MG tablet Take 1 tablet (500 mg) by mouth every 12 hours. (Patient not taking: Reported on 05/25/2019) 20 tablet 0   . atorvastatin 10 MG Oral Tablet Take 1 tablet (10 mg) by mouth daily. 90 tablet 2   . BENFOTIAMINE OR Take 250 mg by mouth daily.     . Cholecalciferol (VITAMIN D3) 2000 units Oral Cap Take 1 capsule (2,000 Units) by mouth daily. For low level 1 capsule 1   . Cyanocobalamin 1000 MCG Oral Tab one per day over-the-counter started 5/21 /2012 this level  borderline low 1 Tab 1   . diclofenac 1 % gel      . DULoxetine 30 MG DR capsule Take 1 capsule (30 mg) by mouth daily. 90 capsule 1   . DULoxetine 60 MG DR capsule Take 1 capsule (60 mg) by mouth daily. 90 capsule 1   . EPINEPHrine 0.3 MG/0.3ML Injection Solution Auto-injector Inject as instructed per patient package insert, 0.3 mg intramuscularly or subcutaneously into the thigh, if needed to treat anaphylaxis 3 each 1   . Lidocaine 5 % External Patch Apply 1 patch onto the skin daily. Apply to painful area for up to 12 hours in a 24 hour period. 30 patch 10   . lidocaine 5 % patch Apply 1 patch onto the skin daily. Apply to painful area for up to 12 hours in a 24 hour period. 30 patch 0   . lisinopril 20 MG tablet  Take 1 tablet (20 mg) by mouth every 12 hours. 180 tablet 3   . meloxicam 7.5 MG tablet TAKE ONE TABLET BY MOUTH EVERY MORNING 60 tablet 3   . memantine 10 MG tablet TAKE ONE TABLET BY MOUTH ONCE DAILY 30 tablet 0   . naloxone (Narcan) 4 MG/0.1ML nasal spray Use 1 spray in one nostril for suspected opioid overdose. Call 911. If unresponsive in 2 to 3 minutes, repeat with new naloxone nasal spray. 2 each 0   . oxyCODONE 5 MG tablet Take 1 tablet (5 mg) by mouth every 6 hours as needed for severe pain. (Patient not taking: Reported on 04/07/2019) 10 tablet 0   . oxyCODONE 5 MG tablet Take 1 tablet (5 mg) by mouth every 6 hours as needed for severe pain. (Patient not taking: Reported on 04/07/2019) 20 tablet 0   . oxyCODONE 5 MG tablet Take 1 tablet (5 mg) by mouth every 6 hours as needed. (Patient not taking: Reported on 03/02/2019) 20 tablet 0   . oxyCODONE 5 MG tablet Take 1 tablet (5 mg) by mouth every 6 hours as needed for severe pain. (Patient not taking: Reported on 03/02/2019) 20 tablet 0   . oxyCODONE 5 MG tablet Take 1 tablet (5 mg) by mouth every 6 hours as needed for severe pain. 30 tablet 0   . pregabalin 100 MG capsule Take 1 capsule (100 mg) by mouth 3 times a day. 270 capsule 1   . pregabalin 50 MG capsule 1 po tid 270 capsule 1   . primidone 50 MG tablet Take 1 tablet (50 mg) by mouth 2 times a day. 180 tablet 1   . solifenacin 5 MG tablet Take 1 tablet (5 mg) by mouth daily. 30 tablet 11   . thiamine 100 MG tablet Take 1 tablet by mouth daily.      [name] triamcinolone 0.1 % cream As needed to right ear bid x 1 week 1 Tube 0     No facility-administered medications prior to visit.         PSH:   Past Surgical History:   Procedure Laterality Date   . ANES; COLONOSCOPY  2002    repeat in 3 years   . ANES; COLONOSCOPY & POLYPECTOMY  11/18/2007    repeat in 3 years   . HALLUX RIGIDUS W/CHEILECTOMY 1ST MP JT W/O IMPLT Right 10/21/2017    Dr. Megan Salon   . L meniciscus Left 07/2017    by Dr Matthew Saras , re did left  menisicus    . toe surgery  Right 01/12/2019  Dr Hilarie Fredricksonony Kim    . UNLISTED PROCEDURE FEMUR/KNEE     . UNLISTED PROCEDURE HANDS/FINGERS     . UNLISTED PROCEDURE SPINE  2013    rfa to c3 to c 7         Family History:  family history includes Colon Cancer in his father; Heart (other) in his mother; Other Family Hx in an other family member.    Social History:  Social History     Socioeconomic History   . Marital status: Married     Spouse name: Not on file   . Number of children: Not on file   . Years of education: Not on file   . Highest education level: Not on file   Occupational History   . Not on file   Social Needs   . Financial resource strain: Not on file   . Food insecurity     Worry: Not on file     Inability: Not on file   . Transportation needs     Medical: Not on file     Non-medical: Not on file   Tobacco Use   . Smoking status: Never Smoker   . Smokeless tobacco: Former Estate agentUser   Substance and Sexual Activity   . Alcohol use: Yes   . Drug use: No   . Sexual activity: Not on file   Lifestyle   . Physical activity     Days per week: Not on file     Minutes per session: Not on file   . Stress: Not on file   Relationships   . Social Wellsite geologistconnections     Talks on phone: Not on file     Gets together: Not on file     Attends religious service: Not on file     Active member of club or organization: Not on file     Attends meetings of clubs or organizations: Not on file     Relationship status: Not on file   . Intimate partner violence     Fear of current or ex partner: Not on file     Emotionally abused: Not on file     Physically abused: Not on file     Forced sexual activity: Not on file   Other Topics Concern   . 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  Non-smoker, 14 or more drinks/week almost exclusively wine with dinner.               Physical Exam:   Vitals:    05/31/19 1036   BP: 133/85   BP Cuff Size: Regular   BP Site: Right  Arm   BP Position: Sitting   Pulse: 93   Temp: 96.9 F (36.1 C)        General: The patient is White, Well developed, appearing stated age and in no acute distress      Vascular:   RIGHT:  Dorsalis pedis pulse is palpable.  Posterior tibial pulse is palpable. Capillary filling time note to be immediate  on the distal hallux.      Neurologic:  RIGHT:  The sensation is intact on the foot/ankle.  Motor coordination is intact with normal muscle tone.  There is no manifestations of pathological reflexes noted.      Dermatological:   RIGHT:  There is open wound on distal incision site/base of hallux with packing from wound care.  The small open wound proximally had healed.  There is no erythema, no odor, cellulitis.       Musculoskeletal:   RIGHT:  There is mild/moderate swelling hallux with no motion on fusion site.  The hallux is in good position.    DIAGNOSTIC IMAGING:    Right:  AP/LAT/MO foot (CPT- [id number]) views     Finding:   Post-op x-ray:  There is evidence of calcification dorsally.  The 1st metatarso-phalangeal joint and hallux proximal phalanx  fusion and fracture  is/are fused and healed. The fixation is solid and good position. The osseous alignment is good.  There is no changes is lucency on under that distal plate with slight backing out of distal screw which has not change from prior x-ray.    Assessment:   (S91.301A) Open wound of right foot, initial encounter  (primary encounter diagnosis)  (M20.21) Hallux rigidus, right foot    Plan:  Detailed education and discussion took place with the patient regards to open wound, right which he will continue with wound care clinic.  We have discuss about retained hardware, right.  After the discussion, the patient elected  living with this for now.       We have reviewed x-ray finding and detail discussion took place with the patient.      Return appointment: PRN or symptom worsen or fail to improve    TIME SPENT:  15 minutes was spent face-to-face with patient,  greater than 50% of which was spent counseling and coordinating care. They were given an opportunity to ask questions which were answered in a detailed manner.    [name] DPM, FACFAS  Reconstructive Ankle and Foot Surgeon  [facility]

## 2019-05-31 NOTE — Patient Instructions (Signed)
Wound Care Orders:     Right dorsal foot/hallux:     Cleanse NS and pat dry.   Apply skin prep to periwound intact skin.   Fill wound space with fibracol collagen,   Cover with dry gauze and tape to secure.   Single layer compressogrip.   Post operative surgical shoe with felt to offload wound area (patient already has this at home).   Quarter inch u shaped offloading to skin today. Patient to obtain quarter inch adhesive orthopedic felt to continue with offloading.   Change dressing every other day.     Elevate legs 6 times daily.     Bilateral lower extremity arterial study scheduled for 11/06 at 1030.    RTC in 2 weeks for follow up with Dr. Mahmood.

## 2019-05-31 NOTE — Progress Notes (Signed)
Patient denies any changes in medication since last visit.    Wound Center Orders for Topical Lidocaine Wound Application:    Apply 2% topical gel or 4% topical liquid lidocaine to open wound prior to surgical debridement and assessment, not to exceed 10 ml.     Wound/Ulcer: #1  Anesthetic: lidocaine 2% topical gel

## 2019-05-31 NOTE — Progress Notes (Signed)
Wound Care Orders:     Right dorsal foot/hallux:     Cleanse NS and pat dry.   Apply skin prep to periwound intact skin.   Fill wound space with fibracol collagen,   Cover with dry gauze and tape to secure.   Single layer compressogrip.   Post operative surgical shoe with felt to offload wound area (patient already has this at home).   Quarter inch u shaped offloading to skin today. Patient to obtain quarter inch adhesive orthopedic felt to continue with offloading.   Change dressing every other day.     Elevate legs 6 times daily.     Bilateral lower extremity arterial study scheduled for 11/06 at 1030.    RTC in 2 weeks for follow up with Dr. Joeseph Amor.

## 2019-05-31 NOTE — Patient Instructions (Signed)
He will continue with wound care.

## 2019-05-31 NOTE — Telephone Encounter (Signed)
Informed pt per Jonelle Sidle to call our clinic Monday 06/05/2019 to update his insurance information and we will enter the referrals in the same day.    I will leave the 2 referral requests and information below.    Referral to Sports Medicine  Dr. Wilmon Arms  Dx: [phone] Right foot pain    Referral to [facility]  Dr. 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

## 2019-05-31 NOTE — Progress Notes (Signed)
[facility]  05/31/2019    ID/CC:  Ronnie Mason is a 68 year old male with a history of Right hallux rigidus s/p 1st mptj fusion and proximal phalanx ORIF is seen today for Right foot ulceration  New Patient Consult      INTERVAL HISTORY: Patient see today for ulceration on the Right post-surgical complication. Saw Dr [name] today who believes wound is getting better as does the patient. On new xray does not have hardware loosening. Overall patient and wife are pleased with felt offloading and improving.       Medical History        Past Medical History:   Diagnosis Date   . Carotid Sinus Hypersensitivity    . Disc disorder of lumbosacral region    . Hip injury    . HYPERTENSION     . HYPOTENSION     . Intraparenchymal hemorrhage of brain (HCC) 2017   . Pain of right hip joint 01/23/2016   . 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 Syncope    . 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(HCC)    . URI (upper respiratory infection)         Past Surgical History:   Procedure Laterality Date   . ANES; COLONOSCOPY  2002    repeat in 3 years   . ANES; COLONOSCOPY & POLYPECTOMY  11/18/2007    repeat in 3 years   . HALLUX RIGIDUS W/CHEILECTOMY 1ST MP JT W/O IMPLT Right 10/21/2017    Dr. Donn Pierini   . L meniciscus Left 07/2017    by Dr Marcelle Overlie , re did left menisicus    . toe surgery  Right 01/12/2019    Dr Hilarie Fredrickson    . UNLISTED PROCEDURE FEMUR/KNEE     . UNLISTED PROCEDURE HANDS/FINGERS     . UNLISTED PROCEDURE SPINE  2013    rfa to c3 to c 7          Review of patient's allergies indicates:        Allergies   Allergen Reactions   . Adhesives Rash   . Bee Venom Hives, Itching and Swelling   . Gabapentin      Flu like symptoms, diarrhea, body ache upset stomach   . Methocarbamol Other     Patient's wife reports cognitive difficulties ("goofy")       Current Outpatient Medications:   .  Acetaminophen 500 MG Oral Tab, 2 tablets bid, Disp: , Rfl:   .  albuterol HFA  108 (90 Base) MCG/ACT inhaler, Inhale 2 puffs by mouth every 4 hours as needed for shortness of breath/wheezing. (Patient not taking: Reported on 05/17/2019), Disp: 1 Inhaler, Rfl: 2  .  Alpha-Lipoic Acid 300 MG tablet, Take 1 tablet by mouth daily., Disp: , Rfl:   .  amLODIPine 5 MG tablet, Take 1 tablet (5 mg) by mouth 2 times a day., Disp: 90 tablet, Rfl: 3  .  amoxicillin-clavulanate (Augmentin) 500-125 MG tablet, Take 1 tablet (500 mg) by mouth every 12 hours., Disp: 21 tablet, Rfl: 0  .  amoxicillin-clavulanate (Augmentin) 500-125 MG tablet, Take 1 tablet (500 mg) by mouth every 12 hours., Disp: 20 tablet, Rfl: 0  .  atorvastatin 10 MG Oral Tablet, Take 1 tablet (10 mg) by mouth daily., Disp: 90 tablet, Rfl: 2  .  BENFOTIAMINE OR, Take 250 mg by mouth daily., Disp: , Rfl:   .  Cholecalciferol (VITAMIN D3) 2000 units Oral Cap,  Take 1 capsule (2,000 Units) by mouth daily. For low level, Disp: 1 capsule, Rfl: 1  .  Cyanocobalamin 1000 MCG Oral Tab, one per day over-the-counter started 5/21 /2012 this level borderline low, Disp: 1 Tab, Rfl: 1  .  diclofenac 1 % gel, , Disp: , Rfl:   .  DULoxetine 30 MG DR capsule, Take 1 capsule (30 mg) by mouth daily., Disp: 90 capsule, Rfl: 1  .  DULoxetine 60 MG DR capsule, Take 1 capsule (60 mg) by mouth daily., Disp: 90 capsule, Rfl: 1  .  EPINEPHrine 0.3 MG/0.3ML Injection Solution Auto-injector, Inject as instructed per patient package insert, 0.3 mg intramuscularly or subcutaneously into the thigh, if needed to treat anaphylaxis, Disp: 3 each, Rfl: 1  .  Lidocaine 5 % External Patch, Apply 1 patch onto the skin daily. Apply to painful area for up to 12 hours in a 24 hour period., Disp: 30 patch, Rfl: 10  .  lidocaine 5 % patch, Apply 1 patch onto the skin daily. Apply to painful area for up to 12 hours in a 24 hour period., Disp: 30 patch, Rfl: 0  .  lisinopril 20 MG tablet, Take 1 tablet (20 mg) by mouth every 12 hours., Disp: 180 tablet, Rfl: 3  .  meloxicam 7.5 MG  tablet, TAKE ONE TABLET BY MOUTH EVERY MORNING, Disp: 60 tablet, Rfl: 3  .  memantine 10 MG tablet, TAKE ONE TABLET BY MOUTH ONCE DAILY, Disp: 30 tablet, Rfl: 0  .  naloxone (Narcan) 4 MG/0.1ML nasal spray, Use 1 spray in one nostril for suspected opioid overdose. Call 911. If unresponsive in 2 to 3 minutes, repeat with new naloxone nasal spray., Disp: 2 each, Rfl: 0  .  oxyCODONE 5 MG tablet, Take 1 tablet (5 mg) by mouth every 6 hours as needed for severe pain. (Patient not taking: Reported on 04/07/2019), Disp: 10 tablet, Rfl: 0  .  oxyCODONE 5 MG tablet, Take 1 tablet (5 mg) by mouth every 6 hours as needed for severe pain. (Patient not taking: Reported on 04/07/2019), Disp: 20 tablet, Rfl: 0  .  oxyCODONE 5 MG tablet, Take 1 tablet (5 mg) by mouth every 6 hours as needed. (Patient not taking: Reported on 03/02/2019), Disp: 20 tablet, Rfl: 0  .  oxyCODONE 5 MG tablet, Take 1 tablet (5 mg) by mouth every 6 hours as needed for severe pain. (Patient not taking: Reported on 03/02/2019), Disp: 20 tablet, Rfl: 0  .  oxyCODONE 5 MG tablet, Take 1 tablet (5 mg) by mouth every 6 hours as needed for severe pain., Disp: 30 tablet, Rfl: 0  .  pregabalin 100 MG capsule, Take 1 capsule (100 mg) by mouth 2 times a day. (Patient not taking: Reported on 04/14/2019), Disp: 180 capsule, Rfl: 1  .  pregabalin 50 MG capsule, 1 po bid and start 50 mg at lunch per day for one week and inc by 50 mg weekly until 150 at lunch as tolerated, Disp: 270 capsule, Rfl: 1  .  primidone 50 MG tablet, Take 1 tablet (50 mg) by mouth 2 times a day., Disp: 180 tablet, Rfl: 1  .  solifenacin 5 MG tablet, Take 1 tablet (5 mg) by mouth daily., Disp: 30 tablet, Rfl: 11  .  thiamine 100 MG tablet, Take 1 tablet by mouth daily. , Disp: , Rfl:   .  triamcinolone 0.1 % cream, As needed to right ear bid x 1 week, Disp: 1 Tube, Rfl: 0  EXAM:    Blood pressure (!) 150/80, pulse 80, temperature 98.1 F (36.7 C), temperature source Temporal, resp. rate 18, weight  (!) 242 lb 12.8 oz (110.1 kg).    Vascular:  RIGHT: Dorsalis pedis pulse is palpable. Posterior tibial pulse is palpable. Capillary filling time note to beimmediateon the distal hallux.     Neurologic:  RIGHT: The sensation is intacton the foot/ankle.   Decreased protective sensation -    Musculoskeletal:   RIGHT:There is mild/moderate swelling hallux with no motion on fusion site. The hallux is in good position.    Dermatological:   Dorsal 1st mptj with scar present   distal wound is smaller, not hypergranular   No acute signs of infection.         Right Lower:  Right Lower Extremity (RLE)  RLE Calf - Cm w/ Point of Measurement: 37 cm  RLE Calf - Cm from the Medial Instep: 34 cm           Wound Data  Wound 05/17/19 #1Right prox dorsal foot FT with (Active)   Date First Assessed/Time First Assessed: 05/17/19 1443   Date Acquired: 02/09/19  Wound Number: #1Right prox dorsal foot FT with  Primary Wound Type: Incision  Wounding Event: Surgical Injury  Result of an Accident: No  Recurrence: Yes  Clustered Woun...   Number of days: 14       Wound 05/17/19 #2 Right dorsal foot distal FT with (Active)   Date First Assessed/Time First Assessed: 05/17/19 1447   Date Acquired: 02/09/19  Wound Number: #2 Right dorsal foot distal FT with  Primary Wound Type: Incision  Wounding Event: Surgical Injury  Result of an Accident: No  Recurrence: Yes  Clustered W...   Number of days: 14     Wound 05/17/19 #1Right prox dorsal foot FT with (Active)   Thickness Other (Comment) 05/31/19 1435   Wound Length (cm) 0 cm 05/31/19 1435   Wound Width (cm) 0 cm 05/31/19 1435   Wound Depth (cm) 0 cm 05/31/19 1435   Wound Volume (cm^3) 0 cm^3 05/31/19 1435   Wound Healing % 100 cm 05/31/19 1435   Wound Surface Area (cm^2) 0 cm^2 05/31/19 1435   Exudate Amt None 05/31/19 1435   Foul Odor After Cleansing No 05/31/19 1435   Granulation Amt None Present 05/31/19 1435   Necrosis None Present 05/31/19 1435   Slough None Present 05/31/19  1435   Structure Exposed None Present 05/31/19 1435   Margin Indistinct, Non-Visible 05/31/19 1435   Peri-wound Assessment Intact 05/31/19 1435   Temperature No Abnormality 05/31/19 1435   Number of days: 14       Wound 05/17/19 #2 Right dorsal foot distal FT with (Active)   Thickness Full thickness with exposed support structure 05/31/19 1435   Wound Length (cm) 0.4 cm 05/31/19 1435   Wound Width (cm) 0.2 cm 05/31/19 1435   Wound Depth (cm) 0.2 cm 05/31/19 1435   Wound Volume (cm^3) 0.02 cm^3 05/31/19 1435   Wound Surface Area (cm^2) 0.08 cm^2 05/31/19 1435   Exudate Amt Small 05/31/19 1435   Exudate Type Serosanguineous 05/31/19 1435   Foul Odor After Cleansing No 05/31/19 1435   Granulation Amt Large - 67-100% 05/31/19 1435   Necrosis None Present 05/31/19 1435   Slough None Present 05/31/19 1435   Structure Exposed Adipose 05/31/19 1435   Margin Well Defined, Not Attached 05/31/19 1435   Undermining 0.3 @ 12 to 4 05/31/19 1435   Peri-wound Assessment  Intact 05/31/19 1435   Temperature No Abnormality 05/31/19 1435   Debridement Instrument Used Curette 05/31/19 1435   Number of days: 14       ASSESSMENT/PLAN:   67yo male with Right hallux rigidus with post-op wound complications with ulceration     -discussed with patient that this maybe a reaction, however is improving and will continue with collagen. Wife instructed on wound care. Debridement today of fibrotic tissue and cauterized.   -post-op shoe dispensed with felt offloading padding to prevent dorsal friction   -compression for edema control  -referral to vascular lab will be completed on 05/09/2019  -educated on signs and symptoms of infection       F/u in 1 week          PROCEDURE  Wound/Ulcer: # Right great toe   Indication for Debridement: abnormal wound base and slough, exudate  Anesthetic: None  Type and Level of Debridement:  -Surgical/Excisional: : subcutaneous tissue  Tissue and/or Material Removed: exudate and fibrin/slough  Severity: fat  layer exposed  Instruments Used: curette  Bleeding: minimal  Bleeding Controlled with: pressure  Actual Area Debrided   -Total Square cm2 = 0.04cm^2  Character of Wound/Ulcer Post-Debridement and Post-Debridement Measurements: Improved:   0.1cm x 0.2cm x 0.2cm

## 2019-05-31 NOTE — Progress Notes (Signed)
Note    Patient: Ronnie Mason   Patient DOB: Feb 24, 1951     DOS:  05/31/2019     Accompanied by:  spouse    Subjective:  Procedure:ORIFClosed displaced fracture of distal phalanx of right great toe and revision of 1st MTPJ arthrodesisdue to non-compliance of NWB, rightRIGHT  PO week  19 weeks post op from original surgery,Date of surgery:02/09/19, 2nd sx, 01/12/19 1st sx    Ronnie Mason is a 68 year old year old malereturn todayfor follow up status post surgery. Patient relates current issues:following with Dr Joeseph Amor for wound care.   He is walking in athletic shoes, which he said he modified by stretching big toe area.The patient states ofnopain and open wound is healing but not as good as he hoped. Current instructed post-op weightbearing status The patient admits ofnobleedingbut has redness, looks a little angry.He denies of fever/chill.Patientdenies of side effects of the prescribed medication. Current post-op pain medication is none.The pain is well controlledand patient has stopped taking opioids. Current therapy: no physical therapy at this point. DVT prophylaxis: none - patient is ambulating.

## 2019-06-05 NOTE — Telephone Encounter (Signed)
Called back she has no insurance information yet , she is pretty sure medicare is primary which would not require a special referral she will check and call back

## 2019-06-06 ENCOUNTER — Telehealth (HOSPITAL_BASED_OUTPATIENT_CLINIC_OR_DEPARTMENT_OTHER): Payer: Self-pay

## 2019-06-06 NOTE — Telephone Encounter (Signed)
Procedure 05/2219 lumbar MBB bilateral L4-L5, L5-S1

## 2019-06-07 ENCOUNTER — Ambulatory Visit: Payer: Medicare HMO | Attending: Podiatrist | Admitting: Podiatrist

## 2019-06-07 VITALS — BP 142/80 | HR 80 | Temp 97.7°F | Resp 20 | Wt 235.0 lb

## 2019-06-07 DIAGNOSIS — L97512 Non-pressure chronic ulcer of other part of right foot with fat layer exposed: Secondary | ICD-10-CM

## 2019-06-07 DIAGNOSIS — T8131XA Disruption of external operation (surgical) wound, not elsewhere classified, initial encounter: Secondary | ICD-10-CM

## 2019-06-07 DIAGNOSIS — G629 Polyneuropathy, unspecified: Secondary | ICD-10-CM | POA: Insufficient documentation

## 2019-06-07 DIAGNOSIS — T819XXD Unspecified complication of procedure, subsequent encounter: Secondary | ICD-10-CM | POA: Insufficient documentation

## 2019-06-07 DIAGNOSIS — T8131XS Disruption of external operation (surgical) wound, not elsewhere classified, sequela: Secondary | ICD-10-CM | POA: Insufficient documentation

## 2019-06-07 NOTE — Progress Notes (Signed)
Wound Care Orders:     Right dorsal foot/hallux:     Cleanse NS and pat dry.   Apply skin prep to periwound intact skin.   Fill wound space with fibracol collagen,   Cover with dry gauze and tape to secure.   Single layer compressogrip.   Post operative surgical shoe with felt to offload wound area (patient already has this at home).   Quarter inch u shaped offloading to skin today. Patient to obtain quarter inch adhesive orthopedic felt to continue with offloading.   Change dressing every other day.   Re-evaluate for revision at next clinic visit.     Discontinue betadine until next clinic visit in 2 weeks.     Elevate legs 6 times daily.     Bilateral lower extremity arterial study scheduled for 11/06 at 1030.     RTC in 2 weeks for follow up with Dr. Mahmood.

## 2019-06-07 NOTE — Progress Notes (Signed)
No change in meds.  Lidocaine 2% gel applied to the wounds.

## 2019-06-07 NOTE — Patient Instructions (Signed)
Wound Care Orders:     Right dorsal foot/hallux:     Cleanse NS and pat dry.   Apply skin prep to periwound intact skin.   Fill wound space with fibracol collagen,   Cover with dry gauze and tape to secure.   Single layer compressogrip.   Post operative surgical shoe with felt to offload wound area (patient already has this at home).   Quarter inch u shaped offloading to skin today. Patient to obtain quarter inch adhesive orthopedic felt to continue with offloading.   Change dressing every other day.   Re-evaluate for revision at next clinic visit.     Discontinue betadine until next clinic visit in 2 weeks.     Elevate legs 6 times daily.     Bilateral lower extremity arterial study scheduled for 11/06 at 1030.     RTC in 2 weeks for follow up with Dr. Joeseph Amor.

## 2019-06-07 NOTE — Progress Notes (Signed)
Dressing applied per provider order.

## 2019-06-08 ENCOUNTER — Ambulatory Visit: Payer: Medicare HMO | Attending: Anesthesiology | Admitting: Anesthesiology

## 2019-06-08 ENCOUNTER — Other Ambulatory Visit: Payer: Self-pay

## 2019-06-08 DIAGNOSIS — M545 Low back pain, unspecified: Secondary | ICD-10-CM

## 2019-06-08 DIAGNOSIS — G8929 Other chronic pain: Secondary | ICD-10-CM | POA: Insufficient documentation

## 2019-06-08 DIAGNOSIS — M792 Neuralgia and neuritis, unspecified: Secondary | ICD-10-CM

## 2019-06-08 DIAGNOSIS — M47816 Spondylosis without myelopathy or radiculopathy, lumbar region: Secondary | ICD-10-CM | POA: Insufficient documentation

## 2019-06-08 DIAGNOSIS — M48061 Spinal stenosis, lumbar region without neurogenic claudication: Secondary | ICD-10-CM | POA: Insufficient documentation

## 2019-06-08 NOTE — Progress Notes (Signed)
Review of Systems   Constitutional: Negative.    HENT: Negative.    Eyes: Negative.    Respiratory: Negative.    Cardiovascular: Negative.    Gastrointestinal: Negative.    Endocrine: Negative.    Genitourinary: Negative.    Musculoskeletal: Positive for back pain and gait problem.   Skin: Negative.    Allergic/Immunologic: Negative.    Hematological: Negative.    Psychiatric/Behavioral: Negative.

## 2019-06-08 NOTE — Progress Notes (Signed)
[facility]  06/08/2019    ID/CC:  Ronnie Mason is a 68 year oldmalewith a history of Right hallux rigidus s/p 1st mptj fusion and proximal phalanx ORIFis seen today for Right foot ulcerationNew Patient Consult      INTERVAL HISTORY:Doing well and his wife has been doing his dressing changes.     Medical History        Past Medical History:   Diagnosis Date   . Carotid Sinus Hypersensitivity    . Disc disorder of lumbosacral region    . Hip injury    . HYPERTENSION     . HYPOTENSION     . Intraparenchymal hemorrhage of brain (HCC) 2017   . Pain of right hip joint 01/23/2016   . 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 Syncope    . 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(HCC)    . URI (upper respiratory infection)         Past Surgical History         Past Surgical History:   Procedure Laterality Date   . ANES; COLONOSCOPY  2002    repeat in 3 years   . ANES; COLONOSCOPY & POLYPECTOMY  11/18/2007    repeat in 3 years   . HALLUX RIGIDUS W/CHEILECTOMY 1ST MP JT W/O IMPLT Right 10/21/2017    Dr. Donn PieriniBrian McInnes   . L meniciscus Left 07/2017    by Dr Marcelle OverlieHolland , re did left menisicus    . toe surgery  Right 01/12/2019    Dr Hilarie Fredricksonony Kim    . UNLISTED PROCEDURE FEMUR/KNEE     . UNLISTED PROCEDURE HANDS/FINGERS     . UNLISTED PROCEDURE SPINE  2013    rfa to c3 to c 7             Review of patient's allergies indicates:        Allergies   Allergen Reactions   . Adhesives Rash   . Bee Venom Hives, Itching and Swelling   . Gabapentin      Flu like symptoms, diarrhea, body ache upset stomach   . Methocarbamol Other     Patient's wife reports cognitive difficulties ("goofy")       Current Outpatient Medications:   .Acetaminophen 500 MG Oral Tab, 2 tablets bid, Disp: , Rfl:   .albuterol HFA 108 (90 Base) MCG/ACT inhaler, Inhale 2 puffs by mouth every 4 hours as needed for shortness of breath/wheezing. (Patient not taking: Reported on  05/17/2019), Disp: 1 Inhaler, Rfl: 2  .Alpha-Lipoic Acid 300 MG tablet, Take 1 tablet by mouth daily., Disp: , Rfl:   .amLODIPine 5 MG tablet, Take 1 tablet (5 mg) by mouth 2 times a day., Disp: 90 tablet, Rfl: 3  .amoxicillin-clavulanate (Augmentin) 500-125 MG tablet, Take 1 tablet (500 mg) by mouth every 12 hours., Disp: 21 tablet, Rfl: 0  .amoxicillin-clavulanate (Augmentin) 500-125 MG tablet, Take 1 tablet (500 mg) by mouth every 12 hours., Disp: 20 tablet, Rfl: 0  .atorvastatin 10 MG Oral Tablet, Take 1 tablet (10 mg) by mouth daily., Disp: 90 tablet, Rfl: 2  .BENFOTIAMINE OR, Take 250 mg by mouth daily., Disp: , Rfl:   .Cholecalciferol (VITAMIN D3) 2000 units Oral Cap, Take 1 capsule (2,000 Units) by mouth daily. For low level, Disp: 1 capsule, Rfl: 1  .Cyanocobalamin 1000 MCG Oral Tab, one per day over-the-counter started 5/21 /2012 this level borderline low, Disp: 1 Tab, Rfl: 1  .diclofenac 1 % gel, ,  Disp: , Rfl:   .DULoxetine 30 MG DR capsule, Take 1 capsule (30 mg) by mouth daily., Disp: 90 capsule, Rfl: 1  .DULoxetine 60 MG DR capsule, Take 1 capsule (60 mg) by mouth daily., Disp: 90 capsule, Rfl: 1  .EPINEPHrine 0.3 MG/0.3ML Injection Solution Auto-injector, Inject as instructed per patient package insert, 0.3 mg intramuscularly or subcutaneously into the thigh, if needed to treat anaphylaxis, Disp: 3 each, Rfl: 1  .Lidocaine 5 % External Patch, Apply 1 patch onto the skin daily. Apply to painful area for up to 12 hours in a 24 hour period., Disp: 30 patch, Rfl: 10  .lidocaine 5 % patch, Apply 1 patch onto the skin daily. Apply to painful area for up to 12 hours in a 24 hour period., Disp: 30 patch, Rfl: 0  .lisinopril 20 MG tablet, Take 1 tablet (20 mg) by mouth every 12 hours., Disp: 180 tablet, Rfl: 3  .meloxicam 7.5 MG tablet, TAKE ONE TABLET BY MOUTH EVERY MORNING, Disp: 60 tablet, Rfl: 3  .memantine 10 MG tablet, TAKE ONE TABLET BY MOUTH ONCE DAILY, Disp: 30  tablet, Rfl: 0  .naloxone (Narcan) 4 MG/0.1ML nasal spray, Use 1 spray in one nostril for suspected opioid overdose. Call 911. If unresponsive in 2 to 3 minutes, repeat with new naloxone nasal spray., Disp: 2 each, Rfl: 0  .oxyCODONE 5 MG tablet, Take 1 tablet (5 mg) by mouth every 6 hours as needed for severe pain. (Patient not taking: Reported on 04/07/2019), Disp: 10 tablet, Rfl: 0  .oxyCODONE 5 MG tablet, Take 1 tablet (5 mg) by mouth every 6 hours as needed for severe pain. (Patient not taking: Reported on 04/07/2019), Disp: 20 tablet, Rfl: 0  .oxyCODONE 5 MG tablet, Take 1 tablet (5 mg) by mouth every 6 hours as needed. (Patient not taking: Reported on 03/02/2019), Disp: 20 tablet, Rfl: 0  .oxyCODONE 5 MG tablet, Take 1 tablet (5 mg) by mouth every 6 hours as needed for severe pain. (Patient not taking: Reported on 03/02/2019), Disp: 20 tablet, Rfl: 0  .oxyCODONE 5 MG tablet, Take 1 tablet (5 mg) by mouth every 6 hours as needed for severe pain., Disp: 30 tablet, Rfl: 0  .pregabalin 100 MG capsule, Take 1 capsule (100 mg) by mouth 2 times a day. (Patient not taking: Reported on 04/14/2019), Disp: 180 capsule, Rfl: 1  .pregabalin 50 MG capsule, 1 po bid and start 50 mg at lunch per day for one week and inc by 50 mg weekly until 150 at lunch as tolerated, Disp: 270 capsule, Rfl: 1  .primidone 50 MG tablet, Take 1 tablet (50 mg) by mouth 2 times a day., Disp: 180 tablet, Rfl: 1  .solifenacin 5 MG tablet, Take 1 tablet (5 mg) by mouth daily., Disp: 30 tablet, Rfl: 11  .thiamine 100 MG tablet, Take 1 tablet by mouth daily. , Disp: , Rfl:   .triamcinolone 0.1 % cream, As needed to right ear bid x 1 week, Disp: 1 Tube, Rfl: 0    EXAM:   Blood pressure (!) 142/80, pulse 80, temperature 97.7 F (36.5 C), resp. rate 20, weight (!) 235 lb (106.6 kg).      Vascular:  RIGHT: Dorsalis pedis pulse is palpable. Posterior tibial pulse is palpable. Capillary filling time note to beimmediateon the  distal hallux.     Neurologic:  RIGHT: The sensation is intacton the foot/ankle.  Decreased protective sensation-    Musculoskeletal:   RIGHT:There is mild/moderate swelling hallux with no motion on fusion site.  The hallux is in good position.    Dermatological:  Dorsal 1st mptj with scar present   Distal wound larger with hypergranular wound bed   Proximal wound is larger with hypergranular wound bed   No acute signs of infection.         Wound Data  Wound 05/17/19 #1Right prox dorsal foot FT with (Active)   Date First Assessed/Time First Assessed: 05/17/19 1443   Date Acquired: 02/09/19  Wound Number: #1Right prox dorsal foot FT with  Primary Wound Type: Incision  Wounding Event: Surgical Injury  Result of an Accident: No  Recurrence: Yes  Clustered Woun...   Number of days: 22       Wound 05/17/19 #2 Right dorsal foot distal FT with (Active)   Date First Assessed/Time First Assessed: 05/17/19 1447   Date Acquired: 02/09/19  Wound Number: #2 Right dorsal foot distal FT with  Primary Wound Type: Incision  Wounding Event: Surgical Injury  Result of an Accident: No  Recurrence: Yes  Clustered W...   Number of days: 22     Wound 05/17/19 #1Right prox dorsal foot FT with (Active)   Thickness Full thickness without exposed support structure 06/07/19 1503   Wound Length (cm) 0.3 cm 06/07/19 1503   Wound Width (cm) 0.3 cm 06/07/19 1503   Wound Depth (cm) 0.4 cm 06/07/19 1503   Wound Volume (cm^3) 0.04 cm^3 06/07/19 1503   Wound Healing % -300 cm 06/07/19 1503   Wound Surface Area (cm^2) 0.09 cm^2 06/07/19 1503   Exudate Amt Scant 06/07/19 1503   Exudate Type Serosanguineous 06/07/19 1503   Foul Odor After Cleansing No 06/07/19 1503   Granulation Amt None Present 06/07/19 1503   Necrosis None Present 06/07/19 [place] - 1-33% 06/07/19 1503   Structure Exposed Adipose 06/07/19 1503   Margin Distinct, Outline Attached 06/07/19 1503   Peri-wound Assessment Intact 06/07/19 1503   Temperature No  Abnormality 06/07/19 1503   Debridement Instrument Used Curette 06/07/19 1503   Number of days: 22       Wound 05/17/19 #2 Right dorsal foot distal FT with (Active)   Thickness Full thickness with exposed support structure 06/07/19 1503   Wound Length (cm) 0.4 cm 06/07/19 1503   Wound Width (cm) 0.3 cm 06/07/19 1503   Wound Depth (cm) 0.4 cm 06/07/19 1503   Wound Volume (cm^3) 0.05 cm^3 06/07/19 1503   Wound Surface Area (cm^2) 0.12 cm^2 06/07/19 1503   Exudate Amt Scant 06/07/19 1503   Exudate Type Serosanguineous 06/07/19 1503   Foul Odor After Cleansing No 06/07/19 1503   Granulation Amt None Present 06/07/19 1503   Necrosis None Present 06/07/19 [place] - 1-33% 06/07/19 1503   Structure Exposed Bone 06/07/19 1503   Margin Distinct, Outline Attached 06/07/19 1503   Peri-wound Assessment Intact 06/07/19 1503   Temperature No Abnormality 06/07/19 1503   Debridement Instrument Used Curette 06/07/19 1503   Number of days: 22         ASSESSMENT/PLAN:  68yo male with Right hallux rigidus withpost-op wound complications with ulceration     -discussed with patient that wound have progressed back to previous week. Continue with collagen dressings. If no improvement will plan on discussing hardware removal with Dr Maudie Mercury   -post-op shoe dispensed with felt offloading padding to prevent dorsal friction   -compression for edema control  -educated on signs and symptoms of infection       F/u in  2 week          PROCEDURE  Wound/Ulcer: #Right great toe  Indication for Debridement: abnormal wound base and slough, exudate  Anesthetic: None  Type and Level of Debridement:  -Surgical/Excisional:: subcutaneous tissue  Tissue and/or Material Removed: exudate and fibrin/slough  Severity: fat layer exposed  Instruments Used: curette  Bleeding:minimal  Bleeding Controlled with: pressure  Actual Area Debrided   -Total Square cm2 =0.04cm^2  Character of Wound/Ulcer Post-Debridement and Post-Debridement Measurements:  Improved:   0.1cm x 0.2cm x 0.2cm both proximal and distal

## 2019-06-08 NOTE — Progress Notes (Signed)
TELE-HEALTH VISIT    Context of the visit  This encounter was provided by the [facility] of [facility] via video teleconferencing.  The provider, Art Buff, MD, was located at [facility] accompanied by no one.  The patient was located at home and was accompanied by his wife, per patient report.    I have explained to the patient that:  . This method of communication has been selected in the context of the current COVID-19 global health crisis, to minimize travel and interpersonal interactions and preserve the health of patients, providers and our community;  . There are different risks with this method, including concerns regarding confidentiality, which we have worked to create a secure environment for our communication,  and the inability to perform a physical examination;  . The encounter is not being recorded and that permission is not granted for recording.    The patient expressed understanding and agreed.      CHIEF COMPLAINT:   Lower back pain    HISTORY OF PRESENT ILLNESS:  The purpose of today's visit is interval reevaluation of lower back pain after diagnotic medial branch blocks. The patient has a pain history significant for bilateral lumbar facet arthropathy with prior positive response to RFA. The patient has recently undergone diagnostic medial branch blocks to the bilateral L4-L5, L5-S1 facet joints on 05/11/19 and 05/25/19.    On his first injection he received bupivicaine 0.5%. His pain scores reduced from 8 to 1 with painful activity and 6 to 1 with rest at its best. Relief was >= 80% and lasted at least 6 hours. He did misinterpret a field on the pain diary and wrote 30% indicating 20% of pain remained. At his second injection he received lidocaine 2%. He noted maybe 50% improvement with that injection and that last a coupled hours.    Today the patient's pain is rated as 6/10 in severity on the verbal numeric rating scale.     The pain is  located in the lower back. He has radiation down to posterior thigh into lateral lower legs bilaterally. Pain is aching and sharp. He believes his gait is worsening.  The pain is made better by heat, tylenol  The pain is made worse by activity.    He is still in PT for his lower back.    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 He has a vascular study scheduled for tomorrow.There is a possibility he may need some hardware removed.    Other than that as stated above the patient denies any changes in past medical history, medications, or allergies since the last office visit.    REVIEW OF SYSTEMS:  The review of systems documented by our clinic staff was provided by the patient. I reviewed and confirmed these details with Ronnie Mason and have no further detail to add.    PHYSICAL EXAMINATION:  There were no vitals taken for this visit.   Constitutional: He is Constitutional: well-appearing, in no distress and cooperative   Skin, exposed parts: color normal, no lesions noted  Eyes: Negative for miosis bilaterally. Pupils appropriate for room lighting. Extraocular movements are intact bilaterally  Psychiatric: Judgement/insight are good. Mood is frustrated. Affect is normal. Patient is oriented to person, place and time    IMPRESSION:   Ronnie Mason is a 68 year old male with  1.Axial low back pain secondary to facet arthropathy at L4-L5, L5-S1 with prior positive response to RFA (last in 2017) as well as lumbar facet arthropathy. More recent diagnostic medial branch blocks have not been helpful so do not plan to proceed to RFA again. Also has histoy of central stenosis and is now describing lower extremity radicular symptoms.  2. R leg/foot pain laterally in settingmallet toe and peroneal brevis and longus tear diagnosed by podiatry-now s/pR foot 1 MTP arthrodesis 01/12/19 with challenges with wound  healing  3. Prior treatment failures including: Short duration of benefit for interlaminar lumbar epidural steroids and TFESI, memantine  4. Multiple prior injections by outside providers (exluding MBB and trigger point injections):   Dr. [name]  ? 03/23/2018 - LEFT L5 TFESI with 80-85%relief for about 3-4 weeksof just axial back pain.  ? 09/15/2017 - Bilateral L5TFESI with 50-75% reliefof mixture of low back and leg pain.  ? 05/19/2017 - RIGHT L5 TFESIwith 100% relief ofRadicularsymptoms for about 3 months  ? 08/12/2016 - L5-S1 ILESI  ? 07/01/2016 - Ultrasound guided right trochanteric bursa injection   ? 05/20/2016 - Bilateral shoulder injections  ? 05/13/2016 - Right L3 L4 L5/S1 RFA   ? 02/19/2016 - Left L3, L4, L5/S1 RFA   ? 12/27/2012 - L5-S1 IL ESI  ? 08/31/2012 - Left L5 TFESI   ? 03/30/2012 - Left L4/5, L5/S1 facet inj  ? 02/10/2012 - Left C6 C7 MB RFA  ? 07/03/2011 - Left L3, L4, L5/S1 RFA  ? 05/12/2011 - Left C4, C5, C6 RFA  5. 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     Unfortunately the patient has inconsistent results with his diagnostic medial branch blocks and his degeneration of his lumbar spine seems to be worsening since his RFA in 2017. Based on recent results I do not think it is worth repeating RFA. In addition, he now seems to be describing radicular symptoms.    We will plan for caudal epidural steroid injection in January assuming wound heals on foot. Would like to defer steroid injection for now so as to not  interfere with wound healing.    We will plan to stop memantine due to lack of efficacy.      Recommend continuing with physical therapy.    Recommend self-massage with massage cane device given he has been noted to have significant myofascial components on exam.    Continue pregabalin, duloxetine, acetaminophen.    Recommend patient see a spine surgeon regarding his back. He does have history of spinal stenosis, though prior MRI not particularly severe. Still may be useful to know what his options may be. Would not rule out SCS trial for patient in future depending how symptoms progress.    I have spent a total of 40 minutes on this visit today including chart review, face to face time with the patient via Zoom, and documentation and coordination of care after the visit.

## 2019-06-09 ENCOUNTER — Ambulatory Visit: Payer: Medicare Other | Attending: Podiatrist

## 2019-06-09 DIAGNOSIS — I70235 Atherosclerosis of native arteries of right leg with ulceration of other part of foot: Secondary | ICD-10-CM

## 2019-06-09 DIAGNOSIS — G629 Polyneuropathy, unspecified: Secondary | ICD-10-CM | POA: Insufficient documentation

## 2019-06-09 DIAGNOSIS — L97519 Non-pressure chronic ulcer of other part of right foot with unspecified severity: Secondary | ICD-10-CM

## 2019-06-12 ENCOUNTER — Other Ambulatory Visit (INDEPENDENT_AMBULATORY_CARE_PROVIDER_SITE_OTHER): Payer: Self-pay | Admitting: Internal Medicine

## 2019-06-12 DIAGNOSIS — G250 Essential tremor: Secondary | ICD-10-CM

## 2019-06-12 NOTE — Telephone Encounter (Signed)
primidone 50 MG tablet  Last visit: 03/16/2019  Last CPE: 09/17/17  Next visit: Visit date not found  Last refill: 12/15/2018  Last Prescribed by: Marthe Patch, MD

## 2019-06-13 ENCOUNTER — Telehealth (HOSPITAL_BASED_OUTPATIENT_CLINIC_OR_DEPARTMENT_OTHER): Payer: Self-pay

## 2019-06-13 ENCOUNTER — Other Ambulatory Visit (HOSPITAL_BASED_OUTPATIENT_CLINIC_OR_DEPARTMENT_OTHER): Payer: Self-pay | Admitting: Anesthesiology

## 2019-06-13 DIAGNOSIS — G8929 Other chronic pain: Secondary | ICD-10-CM

## 2019-06-13 DIAGNOSIS — R2689 Other abnormalities of gait and mobility: Secondary | ICD-10-CM

## 2019-06-13 DIAGNOSIS — M5410 Radiculopathy, site unspecified: Secondary | ICD-10-CM

## 2019-06-13 MED ORDER — PRIMIDONE 50 MG OR TABS
50.0000 mg | ORAL_TABLET | Freq: Two times a day (BID) | ORAL | 1 refills | Status: DC
Start: 2019-06-13 — End: 2019-12-07

## 2019-06-13 NOTE — Telephone Encounter (Signed)
Notified patient that spine MRI has been ordered and provided phone number for scheduling    Laqueta Carina, RN

## 2019-06-20 NOTE — Progress Notes (Signed)
Wound Care Orders:     Right dorsal foot/hallux:     Cleanse NS and pat dry.   Apply skin prep to periwound intact skin.   Cover wound areas with mesalt, Cover with plain alginate,   Cover with dry gauze and tape to secure.   Single layer compressogrip.   Post operative surgical shoe with felt to offload wound area (patient already has this at home).   Quarter inch u shaped offloading to skin today. Patient to obtain quarter inch adhesive orthopedic felt to continue with offloading.   Change dressing every other day.   Re-evaluate for revision at next clinic visit.     Discontinue betadine until next clinic visit in 2 weeks.     Elevate legs 6 times daily.     RTC in 2 weeks for follow up with Dr. Joeseph Amor.

## 2019-06-21 ENCOUNTER — Telehealth (INDEPENDENT_AMBULATORY_CARE_PROVIDER_SITE_OTHER): Payer: Self-pay | Admitting: Internal Medicine

## 2019-06-21 ENCOUNTER — Ambulatory Visit (HOSPITAL_BASED_OUTPATIENT_CLINIC_OR_DEPARTMENT_OTHER): Payer: Medicare HMO | Admitting: Podiatrist

## 2019-06-21 VITALS — BP 160/90 | HR 96 | Temp 97.5°F | Resp 16 | Wt 235.0 lb

## 2019-06-21 DIAGNOSIS — T8131XA Disruption of external operation (surgical) wound, not elsewhere classified, initial encounter: Secondary | ICD-10-CM

## 2019-06-21 DIAGNOSIS — G629 Polyneuropathy, unspecified: Secondary | ICD-10-CM

## 2019-06-21 DIAGNOSIS — M48061 Spinal stenosis, lumbar region without neurogenic claudication: Secondary | ICD-10-CM

## 2019-06-21 DIAGNOSIS — T8131XS Disruption of external operation (surgical) wound, not elsewhere classified, sequela: Secondary | ICD-10-CM

## 2019-06-21 DIAGNOSIS — G8929 Other chronic pain: Secondary | ICD-10-CM

## 2019-06-21 DIAGNOSIS — R2689 Other abnormalities of gait and mobility: Secondary | ICD-10-CM

## 2019-06-21 DIAGNOSIS — S91301A Unspecified open wound, right foot, initial encounter: Secondary | ICD-10-CM

## 2019-06-21 DIAGNOSIS — M5410 Radiculopathy, site unspecified: Secondary | ICD-10-CM

## 2019-06-21 DIAGNOSIS — T819XXD Unspecified complication of procedure, subsequent encounter: Secondary | ICD-10-CM

## 2019-06-21 NOTE — Progress Notes (Signed)
Dressing applied per provider order.

## 2019-06-21 NOTE — Telephone Encounter (Addendum)
Pt states referrals and orders need to come from PCP due to his change in insurance.    I pended the referrals and MRI. All of these were already in Epic ordered by other providers.

## 2019-06-21 NOTE — Progress Notes (Signed)
Ronnie Mason  06/21/2019    ID/CC:  Ronnie Mason is a [age] year oldmalewith a history of Right hallux rigidus s/p 1st mptj fusion and proximal phalanx ORIFis seen today for Right foot ulceration      INTERVAL HISTORY:Overall is doing better, but is still worried that the foot looks inflamed and swollen. Cauterization has helped the wound. Wife has been doing dressing changes for him. Not noted any new acute signs of infection.     Medical History        Past Medical History:   Diagnosis Date   . Carotid Sinus Hypersensitivity    . Disc disorder of lumbosacral region    . Hip injury    . HYPERTENSION     . HYPOTENSION     . Intraparenchymal hemorrhage of brain (HCC) 2017   . Pain of right hip joint 01/23/2016   . 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 Syncope    . 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(HCC)    . URI (upper respiratory infection)         Past Surgical History         Past Surgical History:   Procedure Laterality Date   . ANES; COLONOSCOPY  2002    repeat in 3 years   . ANES; COLONOSCOPY & POLYPECTOMY  11/18/2007    repeat in 3 years   . HALLUX RIGIDUS W/CHEILECTOMY 1ST MP JT W/O IMPLT Right 10/21/2017    Dr. Donn PieriniBrian McInnes   . L meniciscus Left 07/2017    by Dr Marcelle OverlieHolland , re did left menisicus    . toe surgery  Right 01/12/2019    Dr Hilarie Fredricksonony Kim    . UNLISTED PROCEDURE FEMUR/KNEE     . UNLISTED PROCEDURE HANDS/FINGERS     . UNLISTED PROCEDURE SPINE  2013    rfa to c3 to c 7            Review of patient's allergies indicates:        Allergies   Allergen Reactions   . Adhesives Rash   . Bee Venom Hives, Itching and Swelling   . Gabapentin      Flu like symptoms, diarrhea, body ache upset stomach   . Methocarbamol Other     Patient's wife reports cognitive difficulties ("goofy")       Current Outpatient Medications:   .Acetaminophen 500 MG Oral Tab, 2 tablets bid, Disp: , Rfl:   .albuterol HFA 108 (90 Base)  MCG/ACT inhaler, Inhale 2 puffs by mouth every 4 hours as needed for shortness of breath/wheezing. (Patient not taking: Reported on 05/17/2019), Disp: 1 Inhaler, Rfl: 2  .Alpha-Lipoic Acid 300 MG tablet, Take 1 tablet by mouth daily., Disp: , Rfl:   .amLODIPine 5 MG tablet, Take 1 tablet (5 mg) by mouth 2 times a day., Disp: 90 tablet, Rfl: 3  .amoxicillin-clavulanate (Augmentin) 500-125 MG tablet, Take 1 tablet (500 mg) by mouth every 12 hours., Disp: 21 tablet, Rfl: 0  .amoxicillin-clavulanate (Augmentin) 500-125 MG tablet, Take 1 tablet (500 mg) by mouth every 12 hours., Disp: 20 tablet, Rfl: 0  .atorvastatin 10 MG Oral Tablet, Take 1 tablet (10 mg) by mouth daily., Disp: 90 tablet, Rfl: 2  .BENFOTIAMINE OR, Take 250 mg by mouth daily., Disp: , Rfl:   .Cholecalciferol (VITAMIN D3) 2000 units Oral Cap, Take 1 capsule (2,000 Units) by mouth daily. For low level, Disp: 1 capsule, Rfl: 1  .Cyanocobalamin 1000 MCG Oral Tab,  one per day over-the-counter started 5/21 /2012 this level borderline low, Disp: 1 Tab, Rfl: 1  .diclofenac 1 % gel, , Disp: , Rfl:   .DULoxetine 30 MG DR capsule, Take 1 capsule (30 mg) by mouth daily., Disp: 90 capsule, Rfl: 1  .DULoxetine 60 MG DR capsule, Take 1 capsule (60 mg) by mouth daily., Disp: 90 capsule, Rfl: 1  .EPINEPHrine 0.3 MG/0.3ML Injection Solution Auto-injector, Inject as instructed per patient package insert, 0.3 mg intramuscularly or subcutaneously into the thigh, if needed to treat anaphylaxis, Disp: 3 each, Rfl: 1  .Lidocaine 5 % External Patch, Apply 1 patch onto the skin daily. Apply to painful area for up to 12 hours in a 24 hour period., Disp: 30 patch, Rfl: 10  .lidocaine 5 % patch, Apply 1 patch onto the skin daily. Apply to painful area for up to 12 hours in a 24 hour period., Disp: 30 patch, Rfl: 0  .lisinopril 20 MG tablet, Take 1 tablet (20 mg) by mouth every 12 hours., Disp: 180 tablet, Rfl: 3  .meloxicam 7.5 MG tablet, TAKE ONE  TABLET BY MOUTH EVERY MORNING, Disp: 60 tablet, Rfl: 3  .memantine 10 MG tablet, TAKE ONE TABLET BY MOUTH ONCE DAILY, Disp: 30 tablet, Rfl: 0  .naloxone (Narcan) 4 MG/0.1ML nasal spray, Use 1 spray in one nostril for suspected opioid overdose. Call 911. If unresponsive in 2 to 3 minutes, repeat with new naloxone nasal spray., Disp: 2 each, Rfl: 0  .oxyCODONE 5 MG tablet, Take 1 tablet (5 mg) by mouth every 6 hours as needed for severe pain. (Patient not taking: Reported on 04/07/2019), Disp: 10 tablet, Rfl: 0  .oxyCODONE 5 MG tablet, Take 1 tablet (5 mg) by mouth every 6 hours as needed for severe pain. (Patient not taking: Reported on 04/07/2019), Disp: 20 tablet, Rfl: 0  .oxyCODONE 5 MG tablet, Take 1 tablet (5 mg) by mouth every 6 hours as needed. (Patient not taking: Reported on 03/02/2019), Disp: 20 tablet, Rfl: 0  .oxyCODONE 5 MG tablet, Take 1 tablet (5 mg) by mouth every 6 hours as needed for severe pain. (Patient not taking: Reported on 03/02/2019), Disp: 20 tablet, Rfl: 0  .oxyCODONE 5 MG tablet, Take 1 tablet (5 mg) by mouth every 6 hours as needed for severe pain., Disp: 30 tablet, Rfl: 0  .pregabalin 100 MG capsule, Take 1 capsule (100 mg) by mouth 2 times a day. (Patient not taking: Reported on 04/14/2019), Disp: 180 capsule, Rfl: 1  .pregabalin 50 MG capsule, 1 po bid and start 50 mg at lunch per day for one week and inc by 50 mg weekly until 150 at lunch as tolerated, Disp: 270 capsule, Rfl: 1  .primidone 50 MG tablet, Take 1 tablet (50 mg) by mouth 2 times a day., Disp: 180 tablet, Rfl: 1  .solifenacin 5 MG tablet, Take 1 tablet (5 mg) by mouth daily., Disp: 30 tablet, Rfl: 11  .thiamine 100 MG tablet, Take 1 tablet by mouth daily. , Disp: , Rfl:   .triamcinolone 0.1 % cream, As needed to right ear bid x 1 week, Disp: 1 Tube, Rfl: 0    EXAM:  Blood pressure (!) 160/90, pulse 96, temperature 97.5 F (36.4 C), temperature source Oral, resp. rate 16, weight (!) 235 lb (106.6  kg).        Vascular:  RIGHT: Dorsalis pedis pulse is palpable. Posterior tibial pulse is palpable. Capillary filling time note to beimmediateon the distal hallux.     Neurologic:  RIGHT: The  sensation is intacton the foot/ankle.  Decreased protective sensation-    Musculoskeletal:   RIGHT:There is mild/moderate swelling hallux with no motion on fusion site. The hallux is in good position.    Dermatological:  Dorsal 1st mptj with scar present   Distalwound smaller with hypergranular wound bed   Proximal wound is smaller with hypergranular wound bed   No acute signs of infection.      Wound Data  Wound 05/17/19 #1Right prox dorsal foot FT with (Active)   Date First Assessed/Time First Assessed: 05/17/19 1443   Date Acquired: 02/09/19  Wound Number: #1Right prox dorsal foot FT with  Primary Wound Type: Incision  Wounding Event: Surgical Injury  Result of an Accident: No  Recurrence: Yes  Clustered Woun...   Number of days: 35       Wound 05/17/19 #2 Right dorsal foot distal FT with (Active)   Date First Assessed/Time First Assessed: 05/17/19 1447   Date Acquired: 02/09/19  Wound Number: #2 Right dorsal foot distal FT with  Primary Wound Type: Incision  Wounding Event: Surgical Injury  Result of an Accident: No  Recurrence: Yes  Clustered W...   Number of days: 35     Wound 05/17/19 #1Right prox dorsal foot FT with (Active)   Thickness Full thickness with exposed support structure 06/21/19 1548   Wound Length (cm) 0.2 cm 06/21/19 1548   Wound Width (cm) 0.2 cm 06/21/19 1548   Wound Depth (cm) 0.4 cm 06/21/19 1548   Wound Volume (cm^3) 0.02 cm^3 06/21/19 1548   Wound Healing % -100 cm 06/21/19 1548   Wound Surface Area (cm^2) 0.04 cm^2 06/21/19 1548   Exudate Amt Scant 06/21/19 1548   Exudate Type Serosanguineous 06/21/19 1548   Foul Odor After Cleansing No 06/21/19 1548   Granulation Amt Large - 67-100% 06/21/19 1548   Necrosis None Present 06/21/19 [place] None Present 06/21/19 1548    Structure Exposed Bone 06/21/19 1548   Margin Distinct, Outline Attached 06/21/19 1548   Tunneling none 06/21/19 1548   Undermining none 06/21/19 1548   Peri-wound Assessment Intact 06/21/19 1548   Temperature No Abnormality 06/21/19 1548   Secondary Dressing Alginate 06/21/19 1548   Number of days: 35       Wound 05/17/19 #2 Right dorsal foot distal FT with (Active)   Thickness Full thickness with exposed support structure 06/21/19 1548   Wound Length (cm) 0.3 cm 06/21/19 1548   Wound Width (cm) 0.2 cm 06/21/19 1548   Wound Depth (cm) 0.5 cm 06/21/19 1548   Wound Volume (cm^3) 0.03 cm^3 06/21/19 1548   Wound Surface Area (cm^2) 0.06 cm^2 06/21/19 1548   Exudate Amt Scant 06/21/19 1548   Exudate Type Serosanguineous 06/21/19 1548   Foul Odor After Cleansing No 06/21/19 1548   Granulation Amt Large - 67-100% 06/21/19 1548   Necrosis None Present 06/21/19 [place] None Present 06/21/19 1548   Structure Exposed Bone 06/21/19 1548   Margin Distinct, Outline Attached 06/21/19 1548   Tunneling none 06/21/19 1548   Undermining none 06/21/19 1548   Peri-wound Assessment Intact 06/21/19 1548   Temperature No Abnormality 06/21/19 1548   Secondary Dressing Alginate 06/21/19 1548   Number of days: 35                     ASSESSMENT/PLAN:  68yo male with Right hallux rigidus withpost-op wound complications with ulceration     -discussed with patient that improvement is  noted but minimal. Looks to be that the wound has been improving and regressing during the post-op period, or at least since I have been seeing him. Will continue with collagen therapy, however will discuss with Dr [name] about surgical removal of hardware. Patient is in agreement with this and plans to reach out to Dr Reuel Derby office.   -cauterization of wounds   -compression for edema control  -dorsal felt offloading pad applied  -educated on signs and symptoms of infection       F/u in2 week    I spent a total time of 10 minutes face-to-face with the  patient, of which more than 50% was spent counseling and coordinating care as outlined in this note and 2 mins on procedure total of 12 mins

## 2019-06-21 NOTE — Patient Instructions (Signed)
Wound Care Orders:     Right dorsal foot/hallux:     Cleanse NS and pat dry.   Apply skin prep to periwound intact skin.   Cover wound areas with mesalt, Cover with plain alginate,   Cover with dry gauze and tape to secure.   Single layer compressogrip.   Post operative surgical shoe with felt to offload wound area (patient already has this at home).   Quarter inch u shaped offloading to skin today. Patient to obtain quarter inch adhesive orthopedic felt to continue with offloading.   Change dressing every other day.   Re-evaluate for revision at next clinic visit.     Discontinue betadine until next clinic visit in 2 weeks.     Elevate legs 6 times daily.     RTC in 2 weeks for follow up with Dr. Joeseph Amor.        Make follow up appointment with Dr. Maudie Mercury for surgical revision.

## 2019-06-21 NOTE — Progress Notes (Signed)
No change in meds.  No pain reported.

## 2019-06-21 NOTE — Telephone Encounter (Signed)
The patient changed insurance and needs referrals coming from the PCP. They were initially placed by specialists .  He needs:  -MRI referral initially placed by Dr. Lisbeth Ply,  -Wound care referral to Dr. Erby Pian,  -Neurology- Dr. Lisbeth Ply  -Open wound of right foot to Dr.KIM, TONY DONG-HYUN

## 2019-06-22 NOTE — Telephone Encounter (Signed)
Done

## 2019-06-22 NOTE — Telephone Encounter (Signed)
[name], wife, informed and verbalized thanks.

## 2019-06-26 ENCOUNTER — Encounter (HOSPITAL_BASED_OUTPATIENT_CLINIC_OR_DEPARTMENT_OTHER): Payer: Self-pay | Admitting: Anesthesiology

## 2019-06-28 ENCOUNTER — Ambulatory Visit (INDEPENDENT_AMBULATORY_CARE_PROVIDER_SITE_OTHER): Payer: Medicare HMO | Admitting: Podiatrist

## 2019-06-28 ENCOUNTER — Telehealth (HOSPITAL_BASED_OUTPATIENT_CLINIC_OR_DEPARTMENT_OTHER): Payer: Self-pay

## 2019-06-28 DIAGNOSIS — Z01818 Encounter for other preprocedural examination: Secondary | ICD-10-CM

## 2019-06-28 DIAGNOSIS — S91301A Unspecified open wound, right foot, initial encounter: Secondary | ICD-10-CM

## 2019-06-28 DIAGNOSIS — Z969 Presence of functional implant, unspecified: Secondary | ICD-10-CM

## 2019-06-28 NOTE — Progress Notes (Signed)
Patient: Ronnie Mason   Patient DOB: 15-Jul-1951     DOS:  06/28/2019     Accompanied by:  wife    Subjective:  Procedure:ORIFClosed displaced fracture of distal phalanx of right great toe and revision of 1st MTPJ arthrodesisdue to non-compliance of NWB, rightRIGHT  Date of surgery:02/09/19, 2nd sx - ORIF, 01/12/19 1st sx - 1st MTPJ arthrosis.    Ronnie Mason is a 68 year old year old malereturn todayforfollow up status post surgery still wound issues which has reopened.  Dr. Joeseph Amor felt that the hardware could be the nitus for the open wound. Patient relates that Dr Sandy Salaam wound care did helped a bit but it had reopen.Heis walking in athletic shoes, which he said he modified by stretching big toe area.The patient states ofnopainand open woundis healingbut not as good as he hoped.The patient admits ofnobleedingbut has redness and looks a little angry. He denies of fever/chill.Patientdenies of side effects of the prescribed medication. Current post-op pain medication is none.The pain is well controlledand patient has stopped taking opioids. Current therapy: no physical therapy at this point. DVT prophylaxis: none - patient is ambulating.    Goals: evaluate current condition

## 2019-06-28 NOTE — Telephone Encounter (Signed)
RETURN CALL: Voicemail - Detailed Message      SUBJECT:  Appointment Request     REASON FOR VISIT: spinal stenosis   PREFERRED DATE/TIME: discuss with caller.   ADDITIONAL INFORMATION: per DT

## 2019-07-01 NOTE — Patient Instructions (Signed)
We'll plan for the following procedure(s): removal of screw/plate(20680), right

## 2019-07-01 NOTE — Progress Notes (Signed)
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    Ronnie Mason is a 68 year old year old malereturn todayforfollow up status post surgery still wound issues which has reopened.  Dr. Joeseph Amor felt that the hardware could be the nitus for the open wound. Patient relates that Dr Sandy Salaam wound caredid helped a bit but it had reopen.Heis walking in athletic shoes, which he said he modified by stretching big toe area.The patient states ofnopainand open woundis healingbut not as good as he hoped.The patient admits ofnobleedingbut has redness and looks a little angry. He denies of fever/chill.Patientdenies of side effects of the prescribed medication. Current post-op pain medication is none.The pain is well controlledand patient has stopped taking opioids. Current therapy: no physical therapy at this point. DVT prophylaxis: none - patient is ambulating.    Goals:evaluate current condition    PCP:  Donnal Moat, MD     PMH:   Past Medical History:   Diagnosis Date   . Carotid Sinus Hypersensitivity    . Disc disorder of lumbosacral region    . Hip injury    . HYPERTENSION     . HYPOTENSION     . 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 Pain of right hip joint 01/23/2016   . 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 Syncope    . 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([place])    . URI (upper respiratory infection)         Review of patient's allergies indicates:  Allergies   Allergen Reactions   . Adhesives Rash   . Bee Venom Hives, Itching and Swelling   . Gabapentin      Flu like symptoms, diarrhea, body ache upset stomach   .  Methocarbamol Other     Patient's wife reports cognitive difficulties ("goofy")        Medications:   Outpatient Medications Prior to Visit   Medication Sig Dispense Refill   . Acetaminophen 500 MG Oral Tab 2 tablets bid     . albuterol HFA 108 (90 Base) MCG/ACT inhaler Inhale 2 puffs by mouth every 4 hours as needed for shortness of breath/wheezing. 1 Inhaler 2   . Alpha-Lipoic Acid 300 MG tablet Take 1 tablet by mouth daily.     [name] amLODIPine 5 MG tablet Take 1 tablet (5 mg) by mouth 2 times a day. 90 tablet 3   . amoxicillin-clavulanate (Augmentin) 500-125 MG tablet Take 1 tablet (500 mg) by mouth every 12 hours. (Patient not taking: Reported on 05/25/2019) 21 tablet 0   . amoxicillin-clavulanate (Augmentin) 500-125 MG tablet Take 1 tablet (500 mg) by mouth every 12 hours. (Patient not taking: Reported on 05/25/2019) 20 tablet 0   . atorvastatin 10 MG Oral Tablet Take 1 tablet (10 mg) by mouth daily. 90 tablet 2   . BENFOTIAMINE OR Take 250 mg by mouth daily.     . Cholecalciferol (VITAMIN D3) 2000 units Oral Cap Take 1 capsule (2,000 Units) by mouth daily. For low level 1 capsule 1   . Cyanocobalamin 1000 MCG Oral Tab one per day over-the-counter started 5/21 /2012 this level  borderline low 1 Tab 1   . diclofenac 1 % gel      . DULoxetine 30 MG DR capsule Take 1 capsule (30 mg) by mouth daily. 90 capsule 1   . DULoxetine 60 MG DR capsule Take 1 capsule (60 mg) by mouth daily. 90 capsule 1   . EPINEPHrine 0.3 MG/0.3ML Injection Solution Auto-injector Inject as instructed per patient package insert, 0.3 mg intramuscularly or subcutaneously into the thigh, if needed to treat anaphylaxis 3 each 1   . Lidocaine 5 % External Patch Apply 1 patch onto the skin daily. Apply to painful area for up to 12 hours in a 24 hour period. 30 patch 10   . lidocaine 5 % patch Apply 1 patch onto the skin daily. Apply to painful area for up to 12 hours in a 24 hour period. 30 patch 0   . lisinopril 20 MG tablet Take 1 tablet (20 mg) by  mouth every 12 hours. 180 tablet 3   . meloxicam 7.5 MG tablet TAKE ONE TABLET BY MOUTH EVERY MORNING 60 tablet 3   . naloxone (Narcan) 4 MG/0.1ML nasal spray Use 1 spray in one nostril for suspected opioid overdose. Call 911. If unresponsive in 2 to 3 minutes, repeat with new naloxone nasal spray. 2 each 0   . oxyCODONE 5 MG tablet Take 1 tablet (5 mg) by mouth every 6 hours as needed for severe pain. (Patient not taking: Reported on 04/07/2019) 10 tablet 0   . oxyCODONE 5 MG tablet Take 1 tablet (5 mg) by mouth every 6 hours as needed for severe pain. (Patient not taking: Reported on 04/07/2019) 20 tablet 0   . oxyCODONE 5 MG tablet Take 1 tablet (5 mg) by mouth every 6 hours as needed. (Patient not taking: Reported on 03/02/2019) 20 tablet 0   . oxyCODONE 5 MG tablet Take 1 tablet (5 mg) by mouth every 6 hours as needed for severe pain. (Patient not taking: Reported on 06/28/2019) 20 tablet 0   . oxyCODONE 5 MG tablet Take 1 tablet (5 mg) by mouth every 6 hours as needed for severe pain. 30 tablet 0   . pregabalin 100 MG capsule Take 1 capsule (100 mg) by mouth 3 times a day. 270 capsule 1   . pregabalin 50 MG capsule 1 po tid 270 capsule 1   . primidone 50 MG tablet Take 1 tablet (50 mg) by mouth 2 times a day. 180 tablet 1   . solifenacin 5 MG tablet Take 1 tablet (5 mg) by mouth daily. 30 tablet 11   . thiamine 100 MG tablet Take 1 tablet by mouth daily.      [name] triamcinolone 0.1 % cream As needed to right ear bid x 1 week 1 Tube 0     No facility-administered medications prior to visit.         PSH:   Past Surgical History:   Procedure Laterality Date   . ANES; COLONOSCOPY  2002    repeat in 3 years   . ANES; COLONOSCOPY & POLYPECTOMY  11/18/2007    repeat in 3 years   . HALLUX RIGIDUS W/CHEILECTOMY 1ST MP JT W/O IMPLT Right 10/21/2017    Dr. Donn Pierini   . L meniciscus Left 07/2017    by Dr Marcelle Overlie , re did left menisicus    . toe surgery  Right 01/12/2019    Dr Hilarie Fredrickson    . UNLISTED PROCEDURE FEMUR/KNEE     .  UNLISTED PROCEDURE HANDS/FINGERS     . UNLISTED PROCEDURE SPINE  2013    rfa to c3 to c 7         Family History:  family history includes Colon Cancer in his father; Heart (other) in his mother; Other Family Hx in an other family member.    Social History:  Social History     Socioeconomic History   . Marital status: Married     Spouse name: Not on file   . Number of children: Not on file   . Years of education: Not on file   . Highest education level: Not on file   Occupational History   . Not on file   Social Needs   . Financial resource strain: Not on file   . Food insecurity     Worry: Not on file     Inability: Not on file   . Transportation needs     Medical: Not on file     Non-medical: Not on file   Tobacco Use   . Smoking status: Never Smoker   . Smokeless tobacco: Former Estate agentUser   Substance and Sexual Activity   . Alcohol use: Yes   . Drug use: No   . Sexual activity: Not on file   Lifestyle   . Physical activity     Days per week: Not on file     Minutes per session: Not on file   . Stress: Not on file   Relationships   . Social Wellsite geologistconnections     Talks on phone: Not on file     Gets together: Not on file     Attends religious service: Not on file     Active member of club or organization: Not on file     Attends meetings of clubs or organizations: Not on file     Relationship status: Not on file   . Intimate partner violence     Fear of current or ex partner: Not on file     Emotionally abused: Not on file     Physically abused: Not on file     Forced sexual activity: Not on file   Other Topics Concern   . 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  Non-smoker, 14 or more drinks/week almost exclusively wine with dinner.               Physical Exam:   There were no vitals filed for this visit.     General: The patient is White, Well developed, appearing stated age and in no acute distress      Vascular:   RIGHT:  Dorsalis pedis  pulse is palpable.  Posterior tibial pulse is palpable. Capillary filling time note to be immediate  on the distal hallux.      Neurologic:  RIGHT:  The sensation is diminished on the foot/ankle.  Motor coordination is intact with normal muscle tone.  There is no manifestations of pathological reflexes noted.      Dermatological:   RIGHT:  There is two open wound with red granulation tissue on proximal and distal ends of the incision.  There is no drainage, erythema, odor, cellulitis noted.  The remaining incision site has healed nicely.      Musculoskeletal:   RIGHT:  There is no pain to palpation.  There is no motion on 1st MTPJ s/p arthrodesis.  The swelling  has decreased since last vision.  Toe purchase on stance:  There is no static toe purchase on great toe.  There is dynamic toe purchase on great toe.      DIAGNOSTIC IMAGING:    Right:  AP/LAT/MO foot (CPT- 82423) views     Finding:   Post-op x-ray:  The 1st metatarso-phalangeal joint and hallux proximal phalanx  arthrodesis and fracture  - is/are well opposed/approximated, fused and healed. The fixation is solid and good position. The osseous alignment is good.  There is slight lucency noted distal head of proximal phalanx on distal region of the plate but it has not change from previous x-ray.      Assessment:   (Z96.9) Retained orthopaedic hardware  (primary encounter diagnosis)  Plan: XR Foot 3 View Right    (S91.301A) Open wound of right foot, initial encounter    Plan:  Detailed education and discussion took place with the patient regards to retained hardware and chronic open wound, right.   Discussion included spectrum of treatments: continue current care, living with this open wound and removal of hardware.   After the discussion, the patient elected  removal of hardware.       We have reviewed x-ray and previous X-ray finding and detail discussion took place with the patient.      The patient elected to schedule for surgery.  We'll plan for the  following procedure(s):      Procedure(s):       Diagnosis  1. removal of screw/plate(20680), right (Z96.9) Retained orthopaedic hardware      Recovery time and weight bearing status (protective weight bearing) were discussed with the patient.  Patient was given an opportunity to ask all questions.  Questions were answered to the best of my ability.  The patient was instructed to speak with our surgical coordinator and schedule surgery at [facility].  Approximate time needed for this surgery is 1 hours. The pre-operative H & P will be perform by PCP.  Patient will schedule for pre-operative examination date.       Type of anesthesia:  Monitored anesthesia care; moderate to deep anesthesia with IV sedation however final determination will be referred to anesthesiologist.      Patient's BMI:  29    Instrumentation:  none.    Return appointment: follow up for pre-op    TIME SPENT:  25 minutes was spent face-to-face with patient, greater than 50% of which was spent counseling and coordinating care. They were given an opportunity to ask questions which were answered in a detailed manner.    [name] DPM, FACFAS  Reconstructive Ankle and Foot Surgeon  [facility]

## 2019-07-03 ENCOUNTER — Telehealth (HOSPITAL_BASED_OUTPATIENT_CLINIC_OR_DEPARTMENT_OTHER): Payer: Self-pay

## 2019-07-03 NOTE — Telephone Encounter (Signed)
Received call from patient that he is scheduled for surgery with Dr. Maudie Mercury on December 22 for hardware removal. Will cancel future appointments with wound clinic and await new referral prn postoperatively.

## 2019-07-03 NOTE — Addendum Note (Signed)
Addended by: Denton Lank on: 07/03/2019 10:11 AM     Modules accepted: Orders

## 2019-07-04 ENCOUNTER — Telehealth (INDEPENDENT_AMBULATORY_CARE_PROVIDER_SITE_OTHER): Payer: Self-pay | Admitting: Podiatrist

## 2019-07-04 NOTE — Telephone Encounter (Signed)
Physician performing procedure:  Dr. Maudie Mercury    Surgery Scheduling Logistics:   Procedure: R FOOT REMOVAL SCREW/PLATE  Date: 73/22/0254  Location of Procedure: [facility]  Case status:  outpatient  Diagnosis/ICD10: Z96.9  Primary CPT: 20680  Additional CPT(s) including possible procedures     Insurance:  [place]: [place] Telephone Number:   Group Number:     Veterinary surgeon ID:   Insurance Rep:     Pre Authorization Needed: N   PA Number:   Authorization Status:    Reference #:   Josem Kaufmann Received Date/Time:   Auth Received Via:   If auth received via website insert Screenshot here:   Fax, or Letter scanned to media:  /Date    Referral from PCP Needed:    Second Opinion Needed:    SPOKE WITH JAKE NO PA REQUIRED  REF # [id number]

## 2019-07-05 ENCOUNTER — Encounter (HOSPITAL_BASED_OUTPATIENT_CLINIC_OR_DEPARTMENT_OTHER): Payer: Medicare HMO | Admitting: Podiatrist

## 2019-07-05 NOTE — Telephone Encounter (Signed)
Patients wife called to check status. Please call and schedule. Thank you

## 2019-07-06 ENCOUNTER — Telehealth (INDEPENDENT_AMBULATORY_CARE_PROVIDER_SITE_OTHER): Payer: Self-pay | Admitting: Internal Medicine

## 2019-07-06 DIAGNOSIS — M542 Cervicalgia: Secondary | ICD-10-CM

## 2019-07-06 DIAGNOSIS — M5417 Radiculopathy, lumbosacral region: Secondary | ICD-10-CM

## 2019-07-06 NOTE — Telephone Encounter (Signed)
Refill Request: Lidocaine Patches    Last visit: 03/16/2019  Next visit: 07/13/2019  Last refill: 12/30/2016  Last Prescribed by: Marthe Patch MD

## 2019-07-07 MED ORDER — LIDOCAINE 5 % EX PTCH
1.0000 | MEDICATED_PATCH | Freq: Every day | CUTANEOUS | 10 refills | Status: DC
Start: 2019-07-07 — End: 2020-09-04

## 2019-07-11 ENCOUNTER — Ambulatory Visit: Payer: Medicare Other | Attending: Anesthesiology

## 2019-07-11 ENCOUNTER — Encounter (INDEPENDENT_AMBULATORY_CARE_PROVIDER_SITE_OTHER): Payer: Self-pay | Admitting: Internal Medicine

## 2019-07-11 DIAGNOSIS — M47896 Other spondylosis, lumbar region: Secondary | ICD-10-CM | POA: Insufficient documentation

## 2019-07-11 DIAGNOSIS — G8929 Other chronic pain: Secondary | ICD-10-CM

## 2019-07-11 DIAGNOSIS — R2689 Other abnormalities of gait and mobility: Secondary | ICD-10-CM

## 2019-07-12 ENCOUNTER — Ambulatory Visit (INDEPENDENT_AMBULATORY_CARE_PROVIDER_SITE_OTHER): Payer: Medicare HMO | Admitting: Podiatrist

## 2019-07-12 ENCOUNTER — Telehealth (HOSPITAL_BASED_OUTPATIENT_CLINIC_OR_DEPARTMENT_OTHER): Payer: Self-pay

## 2019-07-12 VITALS — BP 149/88 | HR 80 | Temp 97.0°F

## 2019-07-12 DIAGNOSIS — M79671 Pain in right foot: Secondary | ICD-10-CM

## 2019-07-12 DIAGNOSIS — Z969 Presence of functional implant, unspecified: Secondary | ICD-10-CM

## 2019-07-12 MED ORDER — OXYCODONE HCL 5 MG OR TABS
5.0000 mg | ORAL_TABLET | Freq: Four times a day (QID) | ORAL | 0 refills | Status: DC | PRN
Start: 2019-07-12 — End: 2019-08-01

## 2019-07-12 NOTE — Progress Notes (Signed)
Accompanied by:  wife    Planned date of surgery:  07/25/19  The patient states that the condition has existed for many, month(s) and began gradually.    The location of the condition is Right foot painful hardware.    The precipitating event was after surgery issues.    Patient describes the symptoms as tender , sore, causing wounds.    Patient denies heel or calf pain.    The quality of pain aching and throbbing.    The level the pain moderate.    The condition is is no improvement.    The affected area is made worse by unknown.    Past treatments/studies include wound care, boot, elevation.    The patient states slight improvement with treatment.    Previous diagnostic test/evaluation:  seen by podiatrist and x-ray  Patient's limitation is activities are limited in walking.      Goals: walk without pain.

## 2019-07-12 NOTE — Telephone Encounter (Signed)
Left message for Ronnie Mason to call clinic back to relay Dr Onnie Boer message below    Laqueta Carina, RN

## 2019-07-12 NOTE — Telephone Encounter (Signed)
Pt called back. Relayed message below. Pt declined to schedule fu, will pursue neurosurgery referral    Laqueta Carina, RN

## 2019-07-12 NOTE — Telephone Encounter (Signed)
-----   Message from Art Buff, MD sent at 07/12/2019  1:36 PM PST -----  Regarding: MRI results  Can you let Mr. Koelzer know that we received the results of his MRI. As expected, it shows that he has some more degenerative changes to his spine which seems consistent with his symptoms. I am happy to discuss more over Telehealth if he would like or he is welcome to see Neurosurgery as planned and discuss results with them.

## 2019-07-12 NOTE — Progress Notes (Signed)
PRE-OPERATIVE HISTORY AND PHYSCIAL EXAMINATION    Patient: Ronnie Mason   Patient DOB: Mar 30, 1951     DOS:  07/12/2019     Accompanied by:  spouse    Chief Complaint: Mr. Maximos Zayas is a 68 year old year old male who presents today for pre-operative visit with complaint(s) of retain hardware with two small recurrent wound, right.   Chief Complaint   Patient presents with   . Foot Pain     right foot surgery discussion        History of present Illness:   Accompanied by: wife   Planned date of surgery: 07/25/19   The patient states that the condition has existed for total 22 months and began gradually. The location of the condition is Right foot painful hardware with recurrent open wound. The precipitating event was after surgery issues.  Patient describes the symptoms as tender, sore, causing wounds. Patient denies heel or calf pain, fever/chill. The quality of pain aching and throbbing. The level the pain moderate. The condition is is no improvement. The affected area is made worse by unknown. Past treatments/studies include wound care, boot, elevation. The patient states slight improvement with treatment. Previous diagnostic test/evaluation: seen by podiatrist and x-ray Patient's limitation is activities are limited in walking. Goals: walk without pain.     History of MRSA:  Negative    Possibility of Pregnancy: N/A    72 hours Covid 19 test will be taken:  Yes    Prior anesthesia issue:  none    PCP:  [name], MD     PMH:   Past Medical History:   Diagnosis Date   . Carotid Sinus Hypersensitivity    . Disc disorder of lumbosacral region    . Hip injury    . HYPERTENSION     . HYPOTENSION     . Intraparenchymal hemorrhage of brain (HCC) 2017   . Pain of right hip joint 01/23/2016   . 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 Syncope    . 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(HCC)    . URI (upper respiratory infection)         Review of patient's allergies  indicates:  Allergies   Allergen Reactions   . Adhesives Rash   . Bee Venom Hives, Itching and Swelling   . Gabapentin      Flu like symptoms, diarrhea, body ache upset stomach   . Methocarbamol Other     Patient's wife reports cognitive difficulties ("goofy")        Medications:   Outpatient Medications Prior to Visit   Medication Sig Dispense Refill   . Acetaminophen 500 MG Oral Tab 2 tablets bid     . albuterol HFA 108 (90 Base) MCG/ACT inhaler Inhale 2 puffs by mouth every 4 hours as needed for shortness of breath/wheezing. 1 Inhaler 2   . Alpha-Lipoic Acid 300 MG tablet Take 1 tablet by mouth daily.     [name] amLODIPine 5 MG tablet Take 1 tablet (5 mg) by mouth 2 times a day. 90 tablet 3   . amoxicillin-clavulanate (Augmentin) 500-125 MG tablet Take 1 tablet (500 mg) by mouth every 12 hours. (Patient not taking: Reported on 05/25/2019) 21 tablet 0   . amoxicillin-clavulanate (Augmentin) 500-125 MG tablet Take 1 tablet (500 mg) by mouth every 12 hours. (Patient not taking: Reported on 05/25/2019) 20 tablet 0   . atorvastatin 10 MG Oral Tablet Take 1 tablet (10  mg) by mouth daily. 90 tablet 2   . BENFOTIAMINE OR Take 250 mg by mouth daily.     . Cholecalciferol (VITAMIN D3) 2000 units Oral Cap Take 1 capsule (2,000 Units) by mouth daily. For low level 1 capsule 1   . Cyanocobalamin 1000 MCG Oral Tab one per day over-the-counter started 5/21 /2012 this level borderline low 1 Tab 1   . diclofenac 1 % gel      . DULoxetine 30 MG DR capsule Take 1 capsule (30 mg) by mouth daily. 90 capsule 1   . DULoxetine 60 MG DR capsule Take 1 capsule (60 mg) by mouth daily. 90 capsule 1   . EPINEPHrine 0.3 MG/0.3ML Injection Solution Auto-injector Inject as instructed per patient package insert, 0.3 mg intramuscularly or subcutaneously into the thigh, if needed to treat anaphylaxis 3 each 1   . lidocaine 5 % patch Apply 1 patch onto the skin daily. Apply to painful area for up to 12 hours in a 24 hour period. 30 patch 10   .  lisinopril 20 MG tablet Take 1 tablet (20 mg) by mouth every 12 hours. 180 tablet 3   . meloxicam 7.5 MG tablet TAKE ONE TABLET BY MOUTH EVERY MORNING 60 tablet 3   . naloxone (Narcan) 4 MG/0.1ML nasal spray Use 1 spray in one nostril for suspected opioid overdose. Call 911. If unresponsive in 2 to 3 minutes, repeat with new naloxone nasal spray. 2 each 0   . oxyCODONE 5 MG tablet Take 1 tablet (5 mg) by mouth every 6 hours as needed for severe pain. (Patient not taking: Reported on 04/07/2019) 10 tablet 0   . oxyCODONE 5 MG tablet Take 1 tablet (5 mg) by mouth every 6 hours as needed for severe pain. (Patient not taking: Reported on 04/07/2019) 20 tablet 0   . oxyCODONE 5 MG tablet Take 1 tablet (5 mg) by mouth every 6 hours as needed. (Patient not taking: Reported on 03/02/2019) 20 tablet 0   . oxyCODONE 5 MG tablet Take 1 tablet (5 mg) by mouth every 6 hours as needed for severe pain. (Patient not taking: Reported on 06/28/2019) 20 tablet 0   . oxyCODONE 5 MG tablet Take 1 tablet (5 mg) by mouth every 6 hours as needed for severe pain. 30 tablet 0   . pregabalin 100 MG capsule Take 1 capsule (100 mg) by mouth 3 times a day. 270 capsule 1   . pregabalin 50 MG capsule 1 po tid 270 capsule 1   . primidone 50 MG tablet Take 1 tablet (50 mg) by mouth 2 times a day. 180 tablet 1   . solifenacin 5 MG tablet Take 1 tablet (5 mg) by mouth daily. 30 tablet 11   . thiamine 100 MG tablet Take 1 tablet by mouth daily.      [name] triamcinolone 0.1 % cream As needed to right ear bid x 1 week 1 Tube 0     No facility-administered medications prior to visit.         PSH:   Past Surgical History:   Procedure Laterality Date   . ANES; COLONOSCOPY  2002    repeat in 3 years   . ANES; COLONOSCOPY & POLYPECTOMY  11/18/2007    repeat in 3 years   . HALLUX RIGIDUS W/CHEILECTOMY 1ST MP JT W/O IMPLT Right 10/21/2017    Dr. Donn Pierini   . L meniciscus Left 07/2017    by Dr Marcelle Overlie , re did  left menisicus    . toe surgery  Right 01/12/2019    Dr  Wilmon Arms    . UNLISTED PROCEDURE FEMUR/KNEE     . UNLISTED PROCEDURE HANDS/FINGERS     . UNLISTED PROCEDURE SPINE  2013    rfa to c3 to c 7         Family History:  family history includes Colon Cancer in his father; Heart (other) in his mother; Other Family Hx in an other family member.    Social History:  Social History     Socioeconomic History   . Marital status: Married     Spouse name: Not on file   . Number of children: Not on file   . Years of education: Not on file   . Highest education level: Not on file   Occupational History   . Not on file   Social Needs   . Financial resource strain: Not on file   . Food insecurity     Worry: Not on file     Inability: Not on file   . Transportation needs     Medical: Not on file     Non-medical: Not on file   Tobacco Use   . Smoking status: Never Smoker   . Smokeless tobacco: Former Chief Strategy Officer and Sexual Activity   . Alcohol use: Yes   . Drug use: No   . Sexual activity: Not on file   Lifestyle   . Physical activity     Days per week: Not on file     Minutes per session: Not on file   . Stress: Not on file   Relationships   . Social Product manager on phone: Not on file     Gets together: Not on file     Attends religious service: Not on file     Active member of club or organization: Not on file     Attends meetings of clubs or organizations: Not on file     Relationship status: Not on file   . Intimate partner violence     Fear of current or ex partner: Not on file     Emotionally abused: Not on file     Physically abused: Not on file     Forced sexual activity: Not on file   Other Topics Concern   . 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  Non-smoker, 14 or more drinks/week almost exclusively wine with dinner.               Review of Systems   Constitutional: Negative    Eyes: Deferred   Ears, Nose, Mouth, Throat: Deferred   Cardiovascular: Negative    Respiratory:  Negative    Gastrointestinal: Negative   Genitourinary: Deferred   Musculoskeletal: As noted in HPI above   Skin: Negative    Neurological: Negative    Psychiatric: Deferred   Endocrine: Negative    Hematologic/Lymphatic: Negative   Allergic/Immunologic: Deferred     Physical Exam:   There were no vitals filed for this visit.     General: The patient is White Well developed, appearing stated age and in no acute distress  Pleasant, well-developed, well-nourished. Alert and oriented to person, place, and time.  No apparent distress.    Head/face:    normocephalic, atraumatic, no lesions    Eyes:  PERRL/EOMI  Ears, Nose, Mouth and Throat:   Hearing:  Normal as tested by [finger rub, tuning fork, whispered voice].  , Nose:  normal nasal mucosa, septum and turbinates, Mouth:  Lips, gums, tongue and oral mucosa are normal.  Teeth appear healthy., Pharynx:  oropharynx shows normal tonsils and adenoids without post-nasal drainage. supple, no thyromegaly    Neck and Thyroid:  normal,supple,nontender to palpation,no adenopathy     Heart:  regular rate and rhythm, no murmurs, rubs or gallops    Respiratory:   clear to ausculation nl resp effort    Abdomen:  soft, normoactive bowel sounds and nontender, overweight    LOWER EXTREMITIES EXAM:  Vascular:   RIGHT:  Dorsalis pedis pulse is palpable.  Posterior tibial pulse is palpable. Capillary filling time note to be immediate  on the distal hallux.      Neurologic:  RIGHT:  The sensation is intact on the foot/ankle.  Motor coordination is intact with normal muscle tone.  There is no manifestations of pathological reflexes noted.      Dermatological:   RIGHT:  There is two open wound with red granulation tissue on proximal and distal ends of the incision.  There is no drainage, erythema, odor, cellulitis noted.  The remaining incision site has healed nicely.      Musculoskeletal:   RIGHT:  There is no pain to palpation.  There is no motion on 1st MTPJ s/p arthrodesis.  The  swelling has decreased since last vision.  Toe purchase on stance:  There is no static toe purchase on great toe.  There is dynamic toe purchase on great toe.      Assessment:   (Z96.9) Retained orthopaedic hardware  (primary encounter diagnosis)    Planned procedure(s):    1. removal of screw/plate(20680), right  2.   The purpose of these procedure(s): address the retain hardware with recurrent open wound which will alleviate the symptom complex.   Patient understands and rationale for the surgery.      Alternative treatments: live with current condition    Detailed pre & post-operative instructions and recommendation to given.  The weight bearing status will be:  protective weight bearing  with cast boot using following modality to assist ambulation: none.   Patient was instructed not to drive if operated foot is driving foot.   Patient was educated about elevating and icing the surgical foot to control pain and swelling.  And patient was instructed to keep the dressing dry and not to remove the dressing.  Preoperative Hibiclens bathing instructions and sponge was given.      Rx dispensed: Oxycodone 5 mg.   Patient was instructed not to drive while on opioid until affect was realize.      DME: A long camwalker / cast boot    Anesthesia: General anesthesia however final determination will be referred to anesthesiologist.  Patient position:  supine    Consent:  The patient has been advised of the approximate disability involved for these procedures.  In addition, the patient has been advised as to the alternatives of care, including continued conservative care as well as surgical procedures.  The patient understands that if surgical procedures are performed, there are risks and complications that could occur, including but not limited to: hematoma formation, development of a DVT or phlebitis, infection, painful scar tissue formation, limited motion, delayed-union, non-union, mal-union, reaction to implanted  biomaterials, over-correction, under-correction with recurrence of the deformities, CRPS, continued pain, and the possibility that future surgery  may need to be performed.  The patient was given the opportunity to ask questions which were answered to the best of my ability.  The patient voiced no concerns, will consider all these options, and schedule accordingly.  No guarantee given.     Pre-op medical clearance:  PCP.    Pre-operative decision:   1.  DVT risk factors are minimal.  The preventive measure for DVT are regular leg/ankle exercises with compression sock was ordered and early mobilization with/without weight bearing and none - patient is ambulating.    2.  Prophylaxis for cardiac events with perioperative beta-blockers:  No.    3.  Pre-op labs:  Patient's age: 68 year old yo, BMI: 2628 (Age 67-74 - ECG, Age > 8975 - CBC/BMP, BMI >40 & poor exercise tolerance - ECG, Diuretic ACEI or ARB - BMP, DM - ECG, BMP, A1c): No.    4.  Pre-op Covid order:  No.      5.  Prophylaxis for infection:  4% CHG E-Z scrub surgical scrub bottle was dispensed prepping the surgical extremity where patient was instructed scrub the extremity night before and morning of.   Additionally, the surgical extremity will [facility] and in OR room just before surgery.  Patient will also receive pre-operative IV antibiotic and possibly 2nd dose of IV antibiotic if needed.      6.  Change in medication regimen before surgery:  Discontinue NSAIDS/ASA and supplements 7 days prior to surgery.       7.  Post-op steroid coverage:  No     8.  Metabolic/bone healing risk factors:  No      Return appointment: follow up in 1 - 2 weeks for post op check    TIME SPENT:  25 minutes was spent face-to-face with patient, greater than 50% of which was spent counseling and coordinating care. They were given an opportunity to ask questions which were answered in a detailed manner.         Randa Spikeony D.H. Kim DPM, FACFAS  Reconstructive Ankle  and Foot Surgeon  [facility]

## 2019-07-12 NOTE — Patient Instructions (Signed)
PREOPERATIVE DISCUSSION:    Planned procedure(s):    1. removal of screw/plate(20680), right big toe joint    The purpose of these procedure(s): address the retain hardware.      Alternative treatments: live with current condition    Before surgery:    1) STOP taking aspirin or any anti-inflammatory (i.e. Advil (ibuprofen), Aleve, Excedrin, Orudis, naproxen, etc), two weeks prior to your surgery.  These medications can cause increased bleeding at the time of surgery and increase the time it takes to perform the surgery.  This can also lead to increased scar tissue post-operatively.  Tylenol is ok if you need to take something for aches and pains prior to surgery.  If you are on coumadin (blood thinner), typically this should be stopped approximately 3 to 5 days prior to surgery.  Check with your Primary Care Physician.     2) Pre-operative scrub instructions:  The evening before and morning of your surgery use Hibiclens soap to scrub your foot and lower leg from the knee down.  We will typically provide you with a Betadine / Hibiclens soap scrub brush.  For the morning scrub use the same scrub brush just re-wet it.  Pay particular attention to the toenails.  Make sure they are trimmed and clean.  Clean well between your toes.  Rinse with water and towel dry, especially between the toes.  (If you are allergic to Iodine make sure to inform your doctor and use Hibiclens instead of the iodine.  This should be available at your local drug store.  Apply the Hibiclens with a washcloth).  The scrubs are to help prevent infection.    3) Do not eat after midnight the night before your surgery.  You may drink small amount water (only) up to four hours before you are supposed to come in for surgery.    4) You should take your normal morning medications with a sip of water with the following exceptions:  1) Do not take your insulin or oral diabetic medications unless otherwise instructed by your Primary Care Physician or by the  Nurse that calls you the day or so prior to the surgery from the hospital or clinic.  If you have asthma or uses any inhalers, please bring your inhalers to the hospital or surgical center.    5) Herbs and "natural" medicine such as ginkgo biloba, fish oil, feverfew, garlic, ginger, ginseng, Flax-seed oil, dong quai (Angelica sinensis),) and danshen (Salvia miltiorrhiza.),[place], Primrose oil, Chondroitin glucosamine and villains (valerian, kava kava and St. John's wort) should be stopped two weeks prior to surgery since these will affect bleeding and anesthesia. The following medications are uncommon but should be discontinued two weeks prior to your surgery:  Nardil (Phenelzine), Parnate (Tranylcyperonine).  Exceptions can be made but this should be discussed with your Primary Care Physician.      6) A Nurse from the hospital, our surgical center or clinic (depending on where you are having your surgery) will call a day or so prior to your surgery and inform you what time to be at the facility.  She / He will also be asking other questions regarding medications, allergies, etc.    7) Dietary Note:  You should avoid using additional salt on your food prior to your surgery for one week.  Salt can cause increased swelling post-operatively and this can lead to increased discomfort.  It is also important to avoid additional salt during your recovery period for the same reason.  8) No pedicures 3 weeks prior to surgery.  If you do get a pedicure prior to 3 weeks, watch for redness and signs of infection.  Let our office know immediately if you see any signs of infection.      Weight bearing status:  protective weight bearing using following modality to assist ambulation: camboot/cast boot.   Do not to drive if operated foot is driving foot.      Dressing:  Keep the dressing dry and not to remove the dressing until 1st post-op visit.      Pain management:  -  rest, ice, elevation - 8-10 inches above the heart,  compression and NSAID as primary care for pain and swelling.    -  Rx dispensed: Oxycodone 5 mg.    -  To avoid constipation:  take extra fluid/water, add fruit/vegetables to diet, eat prunes, and consider laxative or stool softener (docusate sodium -colace) if persist but your doctor first.   -  Consider extra strength Tylenol for pain.  Maximum in 24 hours is 4000mg  (8 Extra Strength tablets or 12 Regular Strengthtablets)    DME: A long camwalker / cast boot    Anesthesia: Monitored anesthesia care; moderate to deep anesthesia with IV sedation however final determination will be referred to anesthesiologist.     POSSIBLE RISKS INHERENT TO ANY SURGERY:  . Infection  . development of a DVT(blood clot) or phlebitis  . Excessive bleeding - hematoma formation and swelling  . painful scar tissue formation  . Numbness and nerve entrapment  . limited motion  . delayed-union , non-union and/or mal-union  . reaction to implanted biomaterials  . over-correction, under-correction with recurrence of the deformities  . CRPS (severe nerve pain)  . continued pain  . possibility that future surgery may need to be performed  . Amputation/death  . General reaction to anesthesia/death  . No guarantee given.     Pre-op medical clearance:  PCP.    Pre-operative risk factors:   1.  A blood clot on your leg (DVT) or a clot travel to your lung (PE) is a serious potential complication during your recovery.   Although the risk is low for DVT/PE (Blood clot) for foot and ankle surgery, we recommend preventive measures.   Based on our assessment, the preventive measure for DVT are regular leg/ankle exercises with compression sock and early mobilization with/without weight bearing and none - patient is ambulating.    2. Prophylaxis for infection:  4% CHG E-Z scrub surgical scrub bottle is given for prepping the surgical extremity where patient was instructed scrub the extremity night before and morning of.   Additionally, the surgical  extremity will be scrub at the hospital twice at [facility] and in OR room just before surgery.  Patient will also receive pre-operative IV antibiotic and possibly 2nd dose of IV antibiotic if needed.      3.  Changes in medication regimen before surgery:  Discontinue NSAIDS/ASA and supplements 7 days prior to surgery.     Preventing Deep Vein Thrombosis After Surgery  In the days and weeks after surgery, you have a higher chance of developing a deep vein thrombosis (DVT). This is a condition in which a blood clot or thrombus develops in a deep vein. They are most common in the leg. But, a DVT may develop in an arm, or another deep vein in the body. A piece of the clot, called an embolus, can separate from the vein and travel to the  lungs. A blood clot in the lungs is called a pulmonary embolus (PE). This can cut off the flow of blood to the lungs. It is a medical emergency and may cause death.    Healthcare providers use the term venous thromboembolism (VTE) to describe both DVT and PE. They use the term VTE because the two conditions are very closely related. And, because their prevention and treatment are closely related.      A patient with a sequential compression device.     Prevention in the hospital or other facility  Your healthcare provider will usually prescribe one or more of the following to prevent blood clots:   Anticoagulant. This is medicine that prevents blood clots. You take it by mouth, by injection, or through an IV. Commonly used anticoagulants include warfarin and heparin. Newer anticoagulants may also be used, including rivaroxaban, apixaban, dabigatran and enoxaparin. Sometimes your healthcare provider may not give you an anticoagulant medicine. It is important that he or she discuss the risks and benefits with you, and document them.    Compression stockings. These are elastic stockings that fit tightly around your legs. They help keep blood flowing toward your heart by the pressure they apply.  They prevent blood from pooling and forming blood clots. When you first put them on, the stockings may be uncomfortable. But after a while, you should get used to them.    Exercises. Simple exercises while you are resting in bed or sitting in a chair can help prevent blood clots. Move your feet in a circle or up and down. Do this 10 times an hour to improve circulation.   Ambulation (getting out of bed and walking). After surgery, a nurse will help you out of bed, as soon as you are able. Moving around improves circulation and helps prevent blood clots.   Sequential compression device (SCD) or intermittent pneumatic compression (IPC). Plastic sleeves are wrapped around your legs and connected to a pump that inflates and deflates the sleeves. This applies gentle pressure to promote blood flow in the legs and prevent blood clots. Remove the sleeves so that you do not trip or fall when you are walking. For example, when you use the bathroom or shower. If you need help removing the sleeves, ask for help.    Prevention at home     Ankle exercises can help keep blood flowing in the veins.     Deep vein thrombosis can happen even after you go home. Follow all instructions from your healthcare provider. The following are some general guidelines about DVT prevention:    Anticoagulant medicine. If an anticoagulant was prescribed, make sure you follow all directions about taking it. Be sure you know what foods and  medicines may interact. Also, ask your healthcare provider what to do if you forget to take a dose.   Compression stockings. Your healthcare provider will tell you how often to wear and remove the stockings. Follow all instructions closely. Each time you remove your stockings, check your legs and feet for reddened areas or sores. If you see any changes, call your healthcare provider right away.   Returning to activity. Follow all instructions about returning to activities. Be as active as you can. This  improves blood flow and helps prevent a clot from forming. When  in bed or in a chair, continue with the ankle exercises you did in the hospital.   Sequential compression device (SCD) or intermittent pneumatic compression (IPC). In certain situations, this device  may be recommended at home. If you are using this device at home, make sure you closely follow all instructions from your healthcare provider. You will be instructed on how often and for how long to use the device. Again, remove the sleeves if you are up and walking.    When to get help  You may have signs or symptoms of a blood clot. Or, you may have signs or symptoms of bleeding from medicines to prevent clots.  Call 911 if you have the following:   Chest pain   Shortness of breath   Fast heartbeat   Excessive sweating   Fainting   Coughing (may cough up blood)   Heavy or uncontrolled bleeding    Call your healthcare provider if you have the following:   Pain, swelling, or redness in the leg, arm, or other area   Blood in the urine or stool   Very dark or tar-like stool   Vomiting with blood   Bleeding from the nose   Bleeding from the gums   A cut that will not stop bleeding   Bleeding from the vagina     Instructions: Using Crutches (Non-Weight-Bearing)  Your healthcare provider has prescribed crutches for you. A healthy leg can support your body weight, but when you have an injured leg or foot, you need to keep weight off it. The "swing to" method of walking, sometimes called gait, is easy to learn and takes less arm strength and balance. The "swing through" gait takes more practice, but it moves you farther with each step and is less tiring overall. Start with "swing to" and progress to "swing through" when instructed.      Before you use crutches  Be prepared:   Remove throw rugs, electrical cords, and anything else that may cause you to fall.   Arrange your household to keep the items you need handy. Keep everything else out of the  way.   Find a backpack, fanny pack, or apron, or use pockets to carry things. This will help you keep your hands free.  Standing with crutches  Use the balanced standing (tripod) position when you start or end a movement. Also use it whenever you're standing for a length of time.   Move your crutches in front of you about 12 inches.   Hold the injured (or weaker) foot off the floor.   Find your balance.   Be sure not to rest your armpits on the pads of the crutches.  Walking with crutches  Tips include the following:    Start in a balanced standing (tripod) position.   Squeeze the crutch pads against the sides of your chest.   The bottom tips of the crutches should be wide enough apart for you to move easily between them.   Support your weight on your hands, not on your armpits.  Swing to gait   With your crutches in front of you, press down on the handgrips.   Lift your good (stronger) foot and swing your body up to the crutches.   Land on your good foot, between your crutches.   Keep the knee of your injured or weaker foot slightly bent.   Reach forward and out with the crutches to begin the next step.  Swing through gait   With your crutches in front of you, press down on the handgrips.   Lift your good (stronger) foot and swing your body through the crutches.   Land on your good  foot, about 12 inches in front of the crutches.   Keep the knee of your injured leg slightly bent.   Reach forward and out with the crutches to begin the next step.     PLEASE CALL WITH ANY QUESTIONS:  [phone]

## 2019-07-13 ENCOUNTER — Encounter (INDEPENDENT_AMBULATORY_CARE_PROVIDER_SITE_OTHER): Payer: Self-pay | Admitting: Internal Medicine

## 2019-07-13 ENCOUNTER — Ambulatory Visit (INDEPENDENT_AMBULATORY_CARE_PROVIDER_SITE_OTHER): Payer: Medicare HMO | Admitting: Internal Medicine

## 2019-07-13 ENCOUNTER — Ambulatory Visit: Payer: Medicare HMO | Attending: Internal Medicine

## 2019-07-13 VITALS — BP 140/90 | HR 96 | Temp 97.9°F | Resp 8 | Ht 76.0 in | Wt 238.4 lb

## 2019-07-13 DIAGNOSIS — G894 Chronic pain syndrome: Secondary | ICD-10-CM

## 2019-07-13 DIAGNOSIS — Z22322 Carrier or suspected carrier of Methicillin resistant Staphylococcus aureus: Secondary | ICD-10-CM

## 2019-07-13 DIAGNOSIS — E782 Mixed hyperlipidemia: Secondary | ICD-10-CM | POA: Insufficient documentation

## 2019-07-13 DIAGNOSIS — F1021 Alcohol dependence, in remission: Secondary | ICD-10-CM | POA: Insufficient documentation

## 2019-07-13 DIAGNOSIS — H6061 Unspecified chronic otitis externa, right ear: Secondary | ICD-10-CM

## 2019-07-13 DIAGNOSIS — M79671 Pain in right foot: Secondary | ICD-10-CM

## 2019-07-13 DIAGNOSIS — G8929 Other chronic pain: Secondary | ICD-10-CM

## 2019-07-13 DIAGNOSIS — Z01818 Encounter for other preprocedural examination: Secondary | ICD-10-CM | POA: Insufficient documentation

## 2019-07-13 DIAGNOSIS — I10 Essential (primary) hypertension: Secondary | ICD-10-CM

## 2019-07-13 DIAGNOSIS — E538 Deficiency of other specified B group vitamins: Secondary | ICD-10-CM

## 2019-07-13 LAB — CBC, DIFF
% Basophils: 1 %
% Eosinophils: 2 %
% Immature Granulocytes: 0 %
% Lymphocytes: 19 %
% Monocytes: 11 %
% Neutrophils: 67 %
% Nucleated RBC: 0 %
Absolute Eosinophil Count: 0.12 10*3/uL (ref 0.00–0.50)
Absolute Lymphocyte Count: 1.52 10*3/uL (ref 1.00–4.80)
Basophils: 0.06 10*3/uL (ref 0.00–0.20)
Hematocrit: 50 % (ref 38–50)
Hemoglobin: 16.4 g/dL (ref 13.0–18.0)
Immature Granulocytes: 0.03 10*3/uL (ref 0.00–0.05)
MCH: 33.3 pg (ref 27.3–33.6)
MCHC: 32.8 g/dL (ref 32.2–36.5)
MCV: 101 fL — ABNORMAL HIGH (ref 81–98)
Monocytes: 0.85 10*3/uL — ABNORMAL HIGH (ref 0.00–0.80)
Neutrophils: 5.43 10*3/uL (ref 1.80–7.00)
Nucleated RBC: 0 10*3/uL
Platelet Count: 233 10*3/uL (ref 150–400)
RBC: 4.93 10*6/uL (ref 4.40–5.60)
RDW-CV: 14.6 % — ABNORMAL HIGH (ref 11.6–14.4)
WBC: 8.01 10*3/uL (ref 4.3–10.0)

## 2019-07-13 LAB — COMPREHENSIVE METABOLIC PANEL
ALT (GPT): 33 U/L (ref 10–48)
AST (GOT): 35 U/L (ref 9–38)
Albumin: 4.9 g/dL (ref 3.5–5.2)
Alkaline Phosphatase (Total): 95 U/L (ref 36–161)
Anion Gap: 7 (ref 4–12)
Bilirubin (Total): 0.6 mg/dL (ref 0.2–1.3)
Calcium: 9.8 mg/dL (ref 8.9–10.2)
Carbon Dioxide, Total: 29 meq/L (ref 22–32)
Chloride: 101 meq/L (ref 98–108)
Creatinine: 1.12 mg/dL (ref 0.51–1.18)
Glucose: 92 mg/dL (ref 62–125)
Potassium: 4.3 meq/L (ref 3.6–5.2)
Protein (Total): 7.6 g/dL (ref 6.0–8.2)
Sodium: 137 meq/L (ref 135–145)
Urea Nitrogen: 30 mg/dL — ABNORMAL HIGH (ref 8–21)
eGFR by CKD-EPI: >60 mL/min/{1.73_m2} (ref >59)

## 2019-07-13 LAB — LIPID PANEL
Cholesterol (LDL): 142 mg/dL — ABNORMAL HIGH (ref <130)
Cholesterol/HDL Ratio: 2.7
HDL Cholesterol: 114 mg/dL (ref >39)
Non-HDL Cholesterol: 195 mg/dL — ABNORMAL HIGH (ref 0–159)
Total Cholesterol: 309 mg/dL — ABNORMAL HIGH (ref <200)
Triglyceride: 263 mg/dL — ABNORMAL HIGH (ref <150)

## 2019-07-13 MED ORDER — TRIAMCINOLONE ACETONIDE 0.1 % EX CREA
TOPICAL_CREAM | CUTANEOUS | 1 refills | Status: DC
Start: 2019-07-13 — End: 2020-12-03

## 2019-07-13 NOTE — Progress Notes (Signed)
Ronnie Mason is a 68 year old male       BP (!) 140/90   Pulse 96   Temp 97.9 F (36.6 C) (Oral)   Resp 8   Ht  (1.93 m)   Wt (!) 238 lb 6.4 oz (108.1 kg)   SpO2 96%   BMI 29.02 kg/m   Chief Complaint   Patient presents with   . Pre-Op Exam     completed labs and tests with [name] office yesterday. Just need medical clearance and chart note will be approved. Right foot surgery with 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    Diagnoses and all orders for this visit:    Pre-op exam  -     Comprehensive Metabolic Panel  -     CBC with Differential    Chronic pain in right foot    MRSA (methicillin resistant staph aureus) culture positive    Chronic otitis externa of right ear, unspecified type  Comments:  use hydrocortisone  then triamcinolone  clobetasol ok if failed other but use spairing ly   Orders:  -     triamcinolone 0.1 % cream; As needed to rear bid x 1 week    Chronic pain syndrome  -     Comprehensive Metabolic Panel  -     CBC with Differential    Alcoholism in remission (HCC)  -     Comprehensive Metabolic Panel  -     CBC with Differential    B12 deficiency    Essential hypertension    Mixed hyperlipidemia  -     Lipid Panel      No major contraindication to surgery   Non healing foot and h/o mrisa     Must not drink peri op he aggress to this   Concern is med post op pain med and alcohol  Plus I dont want for him to slip into illness again   He has been drinking 2 glasses of wine per day , he agrees to stop now      H/o chronic pain on chronic meds     Health directive   Has done will years ago   polst form done     Full code for now   No long term ventilation for now   Wife to make decision if he can not     More current labs prior to surgery   htn controlled for now    Ekg was done in July   covid test pre ordered by Dr [name]       I spent a total time of 25 minutes face-to-face with the patient, of which more than 50% was spent counseling and coordinating care as outlined in this note.    Cc Dr [name]       No follow-ups on file.    Patient   express understanding of care plan/medications  Risk of taking medication properly and follow up  discussed especially to call if problem      ______________________________________  Start of  Visit __________________________      HPI  History of:    pcp Shann MedalKraft, Tad Fancher Viseskul, MD     H/o past alcohol abuse , chronic back pain , lyrica, renal insuff, mrsa non healing toe         Patient Care Team:  Shann MedalKraft, Johathon Overturf Viseskul, MD as PCP - General (Internal Medicine)  Shann MedalKraft, Bricia Taher Viseskul, MD (Internal Medicine)  Jeanella CrazeWilkinson Jr., Bryan Lemmaaniel Voorhees, MD as Cardiologist  (Cardiology)  Geri SeminoleLiem, Brian C, MD (Sports Medicine)  Holli HumblesMcInnes, Brian Adyn, DPM (Podiatry)  Dixon BoosHolland, Lawrence Edward, MD (Orthopedic Surgery)  Clydene PughWegley, Steven Jack, MD (Gastroenterology)  Casandra DoffingMenon, Raman Sasi, MD (General Surgery)  Reggie Pileothman, Ivan, ARNP (Urology)  Hilarie FredricksonKim, Tony Dong-Hyun, DPM (Podiatry)       Today follow-up above issues:     Plan surgery w Dr Selena BattenKim 12/22 for removal of plates and screws    H/o staph resistance     Back and foot keeps him up     He maintains on 2 glasses of wine per day and he said not drinking much anymore , drives     Takes b12       Office Visit on 05/05/19   1. CBC with Differential   Result Value Ref Range    WBC 6.62 4.3 - 10.0 10*3/uL    RBC 5.21 4.40 - 5.60 10*6/uL    Hemoglobin 16.7 13.0 - 18.0 g/dL    Hematocrit 52 (H) 38 - 50 %    MCV 99 (H) 81 - 98 fL    MCH 32.1 27.3 - 33.6 pg    MCHC 32.2 32.2 - 36.5  g/dL    Platelet Count 161248 096150 - 400 10*3/uL    RDW-CV 13.4 11.6 - 14.4 %    % Neutrophils 60 %    % Lymphocytes 24 %    % Monocytes 12 %    % Eosinophils 3 %    % Basophils 1 %    % Immature Granulocytes 0 %    Neutrophils 3.96 1.80 - 7.00 10*3/uL    Absolute Lymphocyte Count 1.58 1.00 - 4.80 10*3/uL    Monocytes 0.80 0.00 - 0.80 10*3/uL    Absolute Eosinophil Count 0.19 0.00 - 0.50 10*3/uL    Basophils 0.07 0.00 - 0.20 10*3/uL    Immature Granulocytes 0.02 0.00 - 0.05 10*3/uL    Nucleated RBC 0.00 0.00 10*3/uL    % Nucleated RBC 0 %   2. CRP, high sensitivity   Result Value Ref Range    C_Reactive Protein 8.6 0.0 - 10.0 mg/L   3. WOUND CULTURE W/GRAM ORDER    Specimen: Wound   Result Value Ref Range    Special Requests GS and culture     Microbiology Comment Specimen tracking    4. Wound C/S w/Gram (Anaerobic)    Specimen: Wound   Result Value Ref Range    Special Requests Includes Aerobic screen     Gram Smear No polymorphonuclear cells or organisms seen     Culture       1+  Staphylococcus aureus, coagulase positive  : Oxacillin (representing methicillin) resistance indicates resistance to cephalosporins, beta lactamase inhibitors, imipenem and all other beta lactams.  - For inpatients, isolate using contact precautions per institutional policy.  Contact Infection Control if you have any questions.      Culture 1+  Staphylococcus, coagulase negative  Culture       Providers, see link for important guidelines regarding the interpretation of susceptibility reports: http://tests.labmed.[email or web address].no       Susceptibility    Staphylococcus aureus, coagulase positive - MICROTITER MIC (MCG/ML)-SELECT     Clindamycin <=0.5 Susceptible      Daptomycin <=0.5 Susceptible      Erythromycin >4 Resistant      Levofloxacin 4 Resistant      Moxifloxacin 2 Resistant      Oxacillin >2 Resistant      Tetracycline <=2 Susceptible      Trimeth_Sulfamethoxazole <=2 Susceptible      Vancomycin 1 Susceptible        Mri L  Spine Wo Contrast    Result Date: 07/11/2019  EXAMINATION: MRI L Spine WO Cont  CLINICAL INDICATION: Hx of lower back pain and LE radiculopathy with prior MRI evidence of DDD, spinal stenosis, lumbar facet arthropathy., Non-invasive treatments not working as well as previously for lower back pain and leg pain, so looking to see if any indications for surgery  TECHNIQUE: MRI Lumbar Spine without contrast : Degenerative (L 1)  Non-contrast:  Sagittal T1, T2,  STIR. Axial T1, T2 stack, T2 angle.   COMPARISON: 08/10/2016  FINDINGS: ALIGNMENT:  Straightening. Grade 1 retrolisthesis of L5 upon S1. MARROW: Multilevel degenerative endplate signal changes. Hemangioma within L3. DISCS: Moderate multilevel disc height loss. CORD: Conus ends normally at L1.  Visualized cord and cauda equina are normal. PARAVERTEBRAL SOFT TISSUES: Normal  AXIAL DISCS, DURAL COMPRESSION & FORAMINA: L1-2:  Circumferential disc bulge and mild bilateral facet arthropathy. Mild bilateral foraminal narrowing. No spinal canal stenosis. L2-3:  Circumferential disc bulge and mild bilateral facet arthropathy/ligament flavum hypertrophy. Mild left-sided foraminal narrowing. No spinal canal stenosis. L3-4:  Circumferential disc bulge and moderate bilateral facet arthropathy/ligamentum flavum hypertrophy. Increased moderate spinal canal stenosis with crowding of the cauda equina nerve roots mild right and moderate  left-sided foraminal narrowing. L4-5:  Circumferential disc bulge and moderate bilateral facet arthropathy/ligament flavum hypertrophy. Increased mild spinal canal stenosis with narrowing of the right greater than left subarticular recesses. Moderate bilateral foraminal narrowing. L5-S1: Disc bulge, mild bilateral facet arthropathy and epidural lipomatosis. This results in increased moderate spinal canal stenosis. Moderate bilateral foraminal narrowing.  ATTENDING RADIOLOGIST AND PAGER NUMBER [phone] Shannan Harper MD [phone]    IMPRESSION:  Compared to the MRI of 08/10/2016, interval progression of multilevel degenerative change and epidural lipomatosis which results in worsening moderate spinal canal stenosis at L3-L4 and L5-S1 with crowding of the cauda equina nerve roots and multilevel moderate foraminal stenosis.  Note: The following findings are so common in people without low back pain that while we report their presence, they must be interpreted with caution and  in the context of the clinical situation.  (Reference --Jarvik et al, Spine 2001)  Findings (prevalence in patients without low back pain) Disc degeneration (decreased T2 signal, height loss, bulge) (91%) Disc T2 -- signal loss (83%) Disc height loss (56%) Disc bulge (64%) Disc protrusion (32%) Annular tear (38%)    Xr Foot 3 View Right    Result Date: 07/03/2019  EXAMINATION: XR FOOT MIN 3 VIEWS RIGHT  CLINICAL INDICATION: RIGHT FOOT PAIN  COMPARISON: 05/31/2019  FINDINGS AND IMPRESSION: Postoperative changes of first MTP arthrodesis with osseous ankylosis. No evidence of hardware complication. Second digit hammertoe deformity. Mild first interphalangeal joint osteoarthritis.  ATTENDING RADIOLOGIST AND PAGER NUMBER [phone] CHALIAN MAJID  MD [phone]    Xr Foot 3 View Right  Result Date: 06/28/2019  EXAMINATION: XR FOOT MIN 3 VIEWS RIGHT  CLINICAL INDICATION: RIGHT FOOT PAIN  COMPARISON: 05/31/2019  FINDINGS AND IMPRESSION: Postoperative changes of first MTP arthrodesis with osseous ankylosis. No evidence of hardware complication. Second digit hammertoe deformity. Mild first interphalangeal joint osteoarthritis.  ATTENDING RADIOLOGIST AND PAGER NUMBER [phone] Bethann Goo  MD    ekg was done in July in chart    Results  EKG 12-LEAD (Order [id number])  EKG 12-LEAD  Order: [id number]  Status:  Final result Visible to patient:  Yes ([facility]) Next appt:  07/17/2019 at 11:15 AM in [place] [facility] RN 2)  Component   Ref Range & Units 02/09/19 1556   Ventricular Rate   BPM 87    Atrial Rate    BPM 87    P-R Interval   ms 162    QRS Duration   ms 70    Q-T Interval   ms 368    QTC Calculation   ms 442    P Axis   degrees 15    R Axis   degrees 31    T Axis   degrees 32    Diagnosis  Normal sinus rhythm   Normal ECG   When compared with ECG of 12-Oct-2017 10:58,   No significant change was found   Confirmed by DODGE M.D., Annie Main [phone]) on 02/10/2019 11:29:37 AM          Specimen Collected: 02/09/19 15:56                 MEDICATION PRIOR TO VISIT  Reports   Outpatient Medications Prior to Visit   Medication Sig Dispense Refill   . Acetaminophen 500 MG Oral Tab 2 tablets bid     . albuterol HFA 108 (90 Base) MCG/ACT inhaler Inhale 2 puffs by mouth every 4 hours as needed for shortness of breath/wheezing. 1 Inhaler 2   . Alpha-Lipoic Acid 300 MG tablet Take 1 tablet by mouth daily.     [name] amLODIPine 5 MG tablet Take 1 tablet (5 mg) by mouth 2 times a day. 90 tablet 3   . amoxicillin-clavulanate (Augmentin) 500-125 MG tablet Take 1 tablet (500 mg) by mouth every 12 hours. (Patient not taking: Reported on 05/25/2019) 21 tablet 0   . amoxicillin-clavulanate (Augmentin) 500-125 MG tablet Take 1 tablet (500 mg) by mouth every 12 hours. (Patient not taking: Reported on 05/25/2019) 20 tablet 0   . atorvastatin 10 MG Oral Tablet Take 1 tablet (10 mg) by mouth daily. 90 tablet 2   . BENFOTIAMINE OR Take 250 mg by mouth daily.     . Cholecalciferol (VITAMIN D3) 2000 units Oral Cap Take 1 capsule (2,000 Units) by mouth daily. For low level 1 capsule 1   . Cyanocobalamin 1000 MCG Oral Tab one per day over-the-counter started 5/21 /2012 this level borderline low 1 Tab 1   . diclofenac 1 % gel      . DULoxetine 30 MG DR capsule Take 1 capsule (30 mg) by mouth daily. 90 capsule 1   . DULoxetine 60 MG DR capsule Take 1 capsule (60 mg) by mouth daily. 90 capsule 1   . EPINEPHrine 0.3 MG/0.3ML Injection Solution Auto-injector Inject as instructed per patient package insert, 0.3 mg intramuscularly or subcutaneously into the  thigh, if needed to treat anaphylaxis 3 each 1   . lidocaine 5 % patch Apply 1 patch onto the skin daily. Apply to painful area for up to 12 hours in  a 24 hour period. 30 patch 10   . lisinopril 20 MG tablet Take 1 tablet (20 mg) by mouth every 12 hours. 180 tablet 3   . meloxicam 7.5 MG tablet TAKE ONE TABLET BY MOUTH EVERY MORNING 60 tablet 3   . naloxone (Narcan) 4 MG/0.1ML nasal spray Use 1 spray in one nostril for suspected opioid overdose. Call 911. If unresponsive in 2 to 3 minutes, repeat with new naloxone nasal spray. 2 each 0   . oxyCODONE 5 MG tablet Take 1 tablet (5 mg) by mouth every 6 hours as needed for severe pain. 10 tablet 0   . oxyCODONE 5 MG tablet Take 1 tablet (5 mg) by mouth every 6 hours as needed for severe pain. (Patient not taking: Reported on 04/07/2019) 10 tablet 0   . oxyCODONE 5 MG tablet Take 1 tablet (5 mg) by mouth every 6 hours as needed for severe pain. (Patient not taking: Reported on 04/07/2019) 20 tablet 0   . oxyCODONE 5 MG tablet Take 1 tablet (5 mg) by mouth every 6 hours as needed. (Patient not taking: Reported on 03/02/2019) 20 tablet 0   . oxyCODONE 5 MG tablet Take 1 tablet (5 mg) by mouth every 6 hours as needed for severe pain. (Patient not taking: Reported on 06/28/2019) 20 tablet 0   . oxyCODONE 5 MG tablet Take 1 tablet (5 mg) by mouth every 6 hours as needed for severe pain. 30 tablet 0   . pregabalin 100 MG capsule Take 1 capsule (100 mg) by mouth 3 times a day. 270 capsule 1   . pregabalin 50 MG capsule 1 po tid 270 capsule 1   . primidone 50 MG tablet Take 1 tablet (50 mg) by mouth 2 times a day. 180 tablet 1   . solifenacin 5 MG tablet Take 1 tablet (5 mg) by mouth daily. 30 tablet 11   . thiamine 100 MG tablet Take 1 tablet by mouth daily.      [name] triamcinolone 0.1 % cream As needed to right ear bid x 1 week 1 Tube 0     No facility-administered medications prior to visit.        REVIEW OF SYSTEMS -   Not reviewed if not marked   P=Positive  N=Negative      P    N      Constitutional: does snore , few yrs ago report having sleep test   Did some test not sure     Driving , no accidents   Not falling      Eyes:      Head ears, nose mouth throat:    Cardiovascular:   No palp no chest pain     Respiratory:    Sob as sedentary     Gastrointestinal:       Genitourinary:        Musculoskeletal:        Integumentary:       Neurological:       Psychiatric/Behavioral:       1. Little interest or pleasure in doing things: 0    2. Feeling down, depressed or hopeless: 0    3. Trouble falling or staying asleep, or sleeping too much: 0    4. Feeling tired or having little energy : 0    5. Poor appetite or overeating: 0    6. Feeling bad about yourself - or that you are a failure or have let yourself or your family  down: 0    7. Trouble concentrating on things, such as reading the newspaper or watching television: 0    8. Moving or speaking much more slowly than usual.  Or the opposite - fidgety or restless: 0    9. Thoughts that you would be better off dead or of hurting yourself in some way: 0    PHQ9 Total Score: 0    No data recorded      []  []  Endocrine:     []  []  Hematological:     []  []  Allergic/Immunologic:      []  []  Breast:   []  [x]  Male: GU /testicular:       Does drink at most 2 wine glass daily     PHYSICAL EXAM    Physical Exam  Constitutional:       Appearance: Normal appearance. He is obese.      Comments: cane   Neck:      Musculoskeletal: Normal range of motion and neck supple.   Cardiovascular:      Rate and Rhythm: Normal rate and regular rhythm.      Pulses: Normal pulses.      Heart sounds: Normal heart sounds.   Pulmonary:      Effort: Pulmonary effort is normal.      Breath sounds: Normal breath sounds.   Abdominal:      Palpations: Abdomen is soft.      Comments: Protuberant    Skin:     General: Skin is warm and dry.   Neurological:      General: No focal deficit present.      Mental Status: He is alert and  oriented to person, place, and time. Mental status is at baseline.         Under bandaid toe not healing     Want to wait on the arm lesion - noted a month ( put steroid cream on it )   No pronator drift no tremor

## 2019-07-14 ENCOUNTER — Encounter (INDEPENDENT_AMBULATORY_CARE_PROVIDER_SITE_OTHER): Payer: Self-pay | Admitting: Internal Medicine

## 2019-07-17 ENCOUNTER — Ambulatory Visit: Payer: Medicare HMO

## 2019-07-17 ENCOUNTER — Telehealth (INDEPENDENT_AMBULATORY_CARE_PROVIDER_SITE_OTHER): Payer: Medicare HMO | Admitting: Neurological Surgery

## 2019-07-20 ENCOUNTER — Other Ambulatory Visit (HOSPITAL_BASED_OUTPATIENT_CLINIC_OR_DEPARTMENT_OTHER): Payer: Self-pay | Admitting: Registered Nurse

## 2019-07-20 DIAGNOSIS — N3941 Urge incontinence: Secondary | ICD-10-CM

## 2019-07-20 DIAGNOSIS — R32 Unspecified urinary incontinence: Secondary | ICD-10-CM

## 2019-07-21 MED ORDER — SOLIFENACIN SUCCINATE 5 MG OR TABS
ORAL_TABLET | ORAL | 0 refills | Status: DC
Start: 2019-07-21 — End: 2020-07-05

## 2019-07-23 ENCOUNTER — Ambulatory Visit: Payer: Medicare Other | Attending: Podiatrist

## 2019-07-23 DIAGNOSIS — Z969 Presence of functional implant, unspecified: Secondary | ICD-10-CM | POA: Insufficient documentation

## 2019-07-23 DIAGNOSIS — Z01812 Encounter for preprocedural laboratory examination: Secondary | ICD-10-CM

## 2019-07-23 DIAGNOSIS — Z20822 Contact with and (suspected) exposure to covid-19: Secondary | ICD-10-CM

## 2019-07-23 DIAGNOSIS — Z20828 Contact with and (suspected) exposure to other viral communicable diseases: Secondary | ICD-10-CM | POA: Insufficient documentation

## 2019-07-23 NOTE — Patient Instructions (Signed)
COVID-19 Antibody Testing  Also known as COVID-19 Serological Testing    The COVID-19 antibody test is a non-urgent laboratory test. To ensure your safety, we recommend discussing with your doctor the risks and benefits of having this test performed at a health care facility.    When is antibody testing performed?  This testing is done weeks AFTER you have potential or confirmed COVID-19 symptoms. Testing is done for:   Patients who were tested and diagnosed with COVID-19   Patients who had symptoms that could have been due to COVID-19 and were never tested    Patients who were exposed to COVID-19 more than 10 days ago      Is antibody testing used if I am currently experiencing symptoms?  No. This test is not used to diagnose acute infections.       What are the possible results I may receive from antibody testing?  You may receive a positive or negative result from this test.    Positive result:  If your test result comes back positive, it means you had a previous COVID-19 infection. Although we are not testing patients who are currently symptomatic, it could also indicate a current COVID-19 infection.     Negative result:  If your test result comes back negative, it means you likely have not had a COVID-19 infection. Depending on when your test was performed, it could also be too early to show antibodies. In certain cases, such as those patients who are immunocompromised, a negative test result may be due to a delayed antibody response or there may not be detectable levels of antibodies.       Does a positive antibody test result mean I am immune to COVID-19?  At this point, it is not known if a positive test result means you are immune to COVID-19. Even if it means you are immune, it is not known how long that immunity may last. A positive test does not mean you are safe to continue your daily activities without precautions.      Do I still need to practice social distancing and other precautions?  Yes,  even with a positive test result, you should still continue social distancing, practicing good hand hygiene, and wearing a mask when social distancing is not possible.      Is it possible that my blood test is wrong?  Yes, there is always a risk of a false positive or false negative test result. If you have questions, you can discuss this with your doctor.       What type of sample is needed?  A blood sample      How long does it take to get my results?  Usually within 1 day, although it may take longer depending on how many tests are run.      Will this test be covered by my insurance?  Each insurance plan may offer different coverage. If you have questions or concerns, please check with your individual insurance plan.      My test came back positive and I am interested in donating my plasma. Where can I find more information?  If you are interested in being a potential plasma donor, please visit the following website: [email or web address]

## 2019-07-23 NOTE — Progress Notes (Signed)
Patient was seen on 07/23/2019 at the [facility] [place] site where a sample of dual nasal pharyngeal collection was taken. The specimen was sent to the [place] lab for COVID-19 testing.  Patient will be informed of test results within 48 hours.  Patient received informational instructions on self-care.    The specimen was collected by: Jolene [place], MA-C

## 2019-07-24 LAB — COVID-19 CORONAVIRUS QUALITATIVE PCR: COVID-19 Coronavirus Qual PCR Result: NOT DETECTED

## 2019-07-25 ENCOUNTER — Ambulatory Visit (HOSPITAL_COMMUNITY): Payer: Self-pay | Admitting: Podiatrist

## 2019-07-25 ENCOUNTER — Ambulatory Visit
Admission: RE | Admit: 2019-07-25 | Discharge: 2019-07-25 | Disposition: A | Discharge status: Home / Self Care | Payer: Medicare HMO | Attending: Podiatrist | Admitting: Podiatrist

## 2019-07-25 DIAGNOSIS — Z472 Encounter for removal of internal fixation device: Secondary | ICD-10-CM

## 2019-07-25 DIAGNOSIS — T8484XA Pain due to internal orthopedic prosthetic devices, implants and grafts, initial encounter: Secondary | ICD-10-CM

## 2019-07-25 DIAGNOSIS — Z969 Presence of functional implant, unspecified: Secondary | ICD-10-CM | POA: Insufficient documentation

## 2019-07-31 ENCOUNTER — Ambulatory Visit (INDEPENDENT_AMBULATORY_CARE_PROVIDER_SITE_OTHER): Payer: Medicare HMO | Admitting: Podiatrist

## 2019-07-31 ENCOUNTER — Telehealth (INDEPENDENT_AMBULATORY_CARE_PROVIDER_SITE_OTHER): Payer: Self-pay | Admitting: Podiatrist

## 2019-07-31 ENCOUNTER — Encounter (INDEPENDENT_AMBULATORY_CARE_PROVIDER_SITE_OTHER): Payer: Self-pay | Admitting: Podiatrist

## 2019-07-31 DIAGNOSIS — S91301A Unspecified open wound, right foot, initial encounter: Secondary | ICD-10-CM

## 2019-07-31 DIAGNOSIS — Z969 Presence of functional implant, unspecified: Secondary | ICD-10-CM

## 2019-07-31 MED ORDER — AMOXICILLIN-POT CLAVULANATE 500-125 MG OR TABS
500.0000 mg | ORAL_TABLET | Freq: Two times a day (BID) | ORAL | 0 refills | Status: AC
Start: 2019-07-31 — End: 2019-08-10

## 2019-07-31 NOTE — Progress Notes (Signed)
Accompanied by:  wife    Procedure:  Retained orthopaedic hardware    RIGHT  PO week #:  6 days.  Date of surgery:  07/25/19    Ronnie Mason  is a 68 year old year old male return today for unscheduled visit.    This is 1st post operative visit.    The patient states took dressing and steri strips off due to nerve pain on top of foot,    Patient relates he could not tolerate dressings in a few days after surgery, so took them off, steri strips came with them, wife tried to keep sterile dressings on it. 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       The patient states of mild and moderate pain.    The patient is NON-COMPLIANT with post-operative instructions.    Currently instructed post-op weightbearing status is post op shoe/boot, limited walking and activity, but he has been pretty active, mentioned he could do so much more that hardware is out.   The patient denies of and fever/chill, SOB/CP and calf pain.    Patient denies of side effects of the prescribed medication.    Current post-op pain medication is Tylenol.    The pain is well controlled.    DVT prophylaxis:  none - patient is ambulating.

## 2019-07-31 NOTE — Telephone Encounter (Signed)
L/m for pt that we called him back, he should call us back.

## 2019-07-31 NOTE — Telephone Encounter (Signed)
General Message:    Detailed Message:   Patient is requesting a call back from Diane. He would not give me anymore information. Please advise   Return Call: Detailed message on voicemail

## 2019-07-31 NOTE — Progress Notes (Signed)
Post-Surgery Follow-Up SOAP Note    Patient: Ronnie Mason   Patient DOB: 02/13/1951     DOS:  07/31/2019     Accompanied by:  spouse    Subjective:    Procedure:  Retained orthopaedic hardware   RIGHT  PO week #:  6 days.  Date of surgery:  07/25/19    Ronnie GranaDavid Paul Capek  is a 68 year old year old male return today for unscheduled visit.  He have called concerned about open wound. This is 1st post operative visit.  Patient relates he could not tolerate dressings in two days after surgery, so took them off, steri strips came with them.  They have agreed pre-operatively he will keep the dressings on for two couples.  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  The patient states of mild and moderate pain.  The patient is completely NON-COMPLIANT with post-operative instructions.  Currently instructed post-op weightbearing status is post op shoe/boot with limited walking and activity, but he has been pretty active and he mentioned he could do so much more that hardware is out.  He is wearing regular shoes with minimal dressing.   The patient denies of and fever/chill, SOB/CP and calf pain.  Patient denies of side effects of the prescribed medication.  Current post-op pain medication is Tylenol.  The pain is well controlled.  DVT prophylaxis:  none - patient is ambulating.     PMH:   Past Medical History:   Diagnosis Date   . Carotid Sinus Hypersensitivity    . Disc disorder of lumbosacral region    . Hip injury    . HYPERTENSION     . HYPOTENSION     . Intraparenchymal hemorrhage of brain (HCC) 2017   . Pain of right hip joint 01/23/2016   . 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 Syncope    . 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(HCC)    . URI (upper respiratory infection)         Review of patient's allergies indicates:  Allergies   Allergen Reactions   . Adhesives Rash   . Bee Venom Hives, Itching and  Swelling   . Gabapentin      Flu like symptoms, diarrhea, body ache upset stomach   . Methocarbamol Other     Patient's wife reports cognitive difficulties ("goofy")        Medications:   Outpatient Medications Prior to Visit   Medication Sig Dispense Refill   . Acetaminophen 500 MG Oral Tab 2 tablets bid     . albuterol HFA 108 (90 Base) MCG/ACT inhaler Inhale 2 puffs by mouth every 4 hours as needed for shortness of breath/wheezing. 1 Inhaler 2   . Alpha-Lipoic Acid 300 MG tablet Take 1 tablet by mouth daily.     [name]. amLODIPine 5 MG tablet Take 1 tablet (5 mg) by mouth 2 times a day. 90 tablet 3   . amoxicillin-clavulanate (Augmentin) 500-125 MG tablet Take 1 tablet (500 mg) by mouth every 12 hours. (Patient not taking: Reported on 05/25/2019) 21 tablet 0   . amoxicillin-clavulanate (Augmentin) 500-125 MG tablet Take 1 tablet (500 mg) by mouth every 12 hours. (Patient not taking: Reported on 05/25/2019) 20 tablet 0   . atorvastatin 10 MG Oral Tablet Take 1 tablet (10 mg) by mouth daily. 90 tablet 2   . BENFOTIAMINE OR Take 250 mg  by mouth daily.     . Cholecalciferol (VITAMIN D3) 2000 units Oral Cap Take 1 capsule (2,000 Units) by mouth daily. For low level 1 capsule 1   . Cyanocobalamin 1000 MCG Oral Tab one per day over-the-counter started 5/21 /2012 this level borderline low 1 Tab 1   . diclofenac 1 % gel      . DULoxetine 30 MG DR capsule Take 1 capsule (30 mg) by mouth daily. 90 capsule 1   . DULoxetine 60 MG DR capsule Take 1 capsule (60 mg) by mouth daily. 90 capsule 1   . EPINEPHrine 0.3 MG/0.3ML Injection Solution Auto-injector Inject as instructed per patient package insert, 0.3 mg intramuscularly or subcutaneously into the thigh, if needed to treat anaphylaxis 3 each 1   . lidocaine 5 % patch Apply 1 patch onto the skin daily. Apply to painful area for up to 12 hours in a 24 hour period. 30 patch 10   . lisinopril 20 MG tablet Take 1 tablet (20 mg) by mouth every 12 hours. 180 tablet 3   . meloxicam 7.5  MG tablet TAKE ONE TABLET BY MOUTH EVERY MORNING 60 tablet 3   . naloxone (Narcan) 4 MG/0.1ML nasal spray Use 1 spray in one nostril for suspected opioid overdose. Call 911. If unresponsive in 2 to 3 minutes, repeat with new naloxone nasal spray. 2 each 0   . oxyCODONE 5 MG tablet Take 1 tablet (5 mg) by mouth every 6 hours as needed for severe pain. 10 tablet 0   . oxyCODONE 5 MG tablet Take 1 tablet (5 mg) by mouth every 6 hours as needed for severe pain. (Patient not taking: Reported on 04/07/2019) 10 tablet 0   . oxyCODONE 5 MG tablet Take 1 tablet (5 mg) by mouth every 6 hours as needed for severe pain. (Patient not taking: Reported on 04/07/2019) 20 tablet 0   . oxyCODONE 5 MG tablet Take 1 tablet (5 mg) by mouth every 6 hours as needed. (Patient not taking: Reported on 03/02/2019) 20 tablet 0   . oxyCODONE 5 MG tablet Take 1 tablet (5 mg) by mouth every 6 hours as needed for severe pain. (Patient not taking: Reported on 06/28/2019) 20 tablet 0   . oxyCODONE 5 MG tablet Take 1 tablet (5 mg) by mouth every 6 hours as needed for severe pain. 30 tablet 0   . pregabalin 100 MG capsule Take 1 capsule (100 mg) by mouth 3 times a day. 270 capsule 1   . pregabalin 50 MG capsule 1 po tid 270 capsule 1   . primidone 50 MG tablet Take 1 tablet (50 mg) by mouth 2 times a day. 180 tablet 1   . solifenacin 5 MG tablet TAKE ONE TABLET BY MOUTH ONCE DAILY 90 tablet 0   . thiamine 100 MG tablet Take 1 tablet by mouth daily.      [name] triamcinolone 0.1 % cream As needed to rear bid x 1 week 15 g 1     No facility-administered medications prior to visit.         Objective:   There were no vitals filed for this visit.     The dressings are off, patient remove the dressing against our instruction.  The surgical sites were examined and the incision(s): central 2 cm has dehisced and inflamed.  There is no evidence of cellulitis.  There is moderate pain on incision.  There is mild-moderate edema and is mild erythema noted on dorsal  hallux.   There is no signs of DVT i.e. no calf pain, swelling and redness. There is no evidence of allodynia.  Signs of hyperalgesia is absent.  There is no motor inhibition. The skin color is Normal for ethnicity.  The skin temperature is Warm.          Assessment:   (S91.301A) Open wound of right foot, initial encounter  (primary encounter diagnosis  (Z96.9) Retained orthopaedic hardware    The patient is making set back in progress post-operatively - wound dehiscence. The patient is NON COMPLIANT - removed the dressing prematurely regarding post-operative instruction.  It is unreal that he is continues to be non compliant with prior open wound issues.      Plan:      Detailed education and discussion took place with the patient regards to open wound/dehiscence due to noncompliance, right.  At this point, he refuse steri strip application and non complication with keeping the dressing on is an issue.  We decided to re-close the dehiscence with suture.     Procedure:   The foot/ankle was prepped for injection with an alcohol wipe, sprayed with ethyl chloride, anesthetized with 61ml of 2% Lidocaine - partial ankle block. The foot was prepped and drape in usual sterile fashion. Using 4-0 nylon, dehisced incision was re-approximated with multiple simple interrupted stitches.  Dressing entails sterile 4 x 4 and ABD cut in U shape to avoid pressure on the incision per patient's request, kerlex, and Ace bandage.   He was instructed to keep the dressing on till next visit and DO NOT [place].     Patient was instructed to call our office if they notice any significant change is post-operative progress and experience severe pain, swelling, fever/chill, calf pain, SOB.  Precaution were stressed today regards to level of activity/elevation/icing.        Following prescription was given:  Augmentin(500) #20 bid with probiotic and this antibiotic prescription was given for precautionary measure.        Ambulatory status:   The patient was instructed to protective weight bearing using camboot/cast boot since post-op irritates his foot.        DVT prophylaxis:  none - patient is ambulating.        Pain management:  The patient was instructed to use rest, ice, elevation, compression and NSAID as primary care for pain and swelling.  At this point we agree to use:  Tylenol.       Return/Follow-up:  as schedule    Vira Browns DPM, FACFAS  Reconstructive Ankle and Foot Surgeon  [facility]

## 2019-07-31 NOTE — Telephone Encounter (Signed)
Talked to pt, his bandages bothered him a few days after surgery , middle of night, so took them off, steri strips were taken off also, a few stitches popped out. And he left surgery site undressed at times, wife tried to keep it covered multiple times. He did not like anything touching his foot. appt made to come in right away was done.

## 2019-07-31 NOTE — Patient Instructions (Signed)
He was instructed to keep the dressing on till next visit and DO NOT [place].

## 2019-08-01 ENCOUNTER — Ambulatory Visit: Payer: Medicare Other | Attending: Neurological Surgery | Admitting: Neurological Surgery

## 2019-08-01 ENCOUNTER — Ambulatory Visit (HOSPITAL_BASED_OUTPATIENT_CLINIC_OR_DEPARTMENT_OTHER): Payer: Medicare Other

## 2019-08-01 ENCOUNTER — Encounter (HOSPITAL_BASED_OUTPATIENT_CLINIC_OR_DEPARTMENT_OTHER): Payer: Self-pay | Admitting: Neurological Surgery

## 2019-08-01 VITALS — BP 137/81 | HR 86 | Temp 96.1°F | Ht 76.0 in | Wt 242.0 lb

## 2019-08-01 DIAGNOSIS — M48061 Spinal stenosis, lumbar region without neurogenic claudication: Secondary | ICD-10-CM

## 2019-08-01 NOTE — Progress Notes (Signed)
Patient Referred By: Donnal Moat, MD  Patient's PCP: Donnal Moat, MD     Subjective:  Patient is a 68 year old male with long history of low back pain and intermittent sciatic pain.      HPI  68yo male with a greater than 10-year history of LBP and right greater than left sciatic pain.  He has tried physical therapy and multiple rounds of injections with the [facility].  He also has had a protracted course with his right foot surgery and now feels that overall he is significantly debilitated by his back pain.    Review of Systems   Neurological: Positive for numbness. Negative for seizures, syncope, weakness and light-headedness.         Objective:  Physical Exam  Neurological:      General: No focal deficit present.      Mental Status: He is oriented to person, place, and time.      Sensory: Sensory deficit present.      Motor: No weakness.      Coordination: Coordination normal.      Gait: Gait abnormal.      Deep Tendon Reflexes: Reflexes normal.      Comments: Patient has a right AFO in place so his gait is affected from his right foot surgery.  No motor weakness but does complain of patchy numbness in right foot     Imaging:  MRI L-spine demonstrates degenerative disc disease throughout the lumbar spine.  Stenosis most pronounced at L3/4 and L5/S1     Assessment and Plan:   68yo male with lumbar stenosis    We have discussed the imaging studies and I have explained to him that a potential surgery would be relatively involved due to multiple levels of pathology.  I recommended exhausting all non-surgical treatments before considering surgery.  He and his wife are in complete agreement.  So they will be trying the following:    1) Possible RFAs in the lower lumbar spine along with possible facet joint injections    2) Physical therapy with possibly water therapy to offload the spine    3) Weight loss back to his baseline weight    4) Possible acupuncture to try again    After  these modalities have been attempted and if they do not result in some lasting relief, he will call us to schedule a follow up appt and possibly broach the topic of surgery.

## 2019-08-02 ENCOUNTER — Encounter (INDEPENDENT_AMBULATORY_CARE_PROVIDER_SITE_OTHER): Payer: Medicare HMO | Admitting: Podiatrist

## 2019-08-08 ENCOUNTER — Ambulatory Visit (INDEPENDENT_AMBULATORY_CARE_PROVIDER_SITE_OTHER): Payer: Medicare PPO | Admitting: Podiatrist

## 2019-08-08 ENCOUNTER — Other Ambulatory Visit: Payer: Self-pay

## 2019-08-08 DIAGNOSIS — Z969 Presence of functional implant, unspecified: Secondary | ICD-10-CM

## 2019-08-08 DIAGNOSIS — S91301A Unspecified open wound, right foot, initial encounter: Secondary | ICD-10-CM

## 2019-08-08 NOTE — Progress Notes (Signed)
Accompanied by:  spouse    Procedure:  Retained orthopaedic hardware  RIGHT  PO week #: 2 week  post op.  Date of surgery:  07/25/19    Ronnie Mason  is a 69 year old year old male return today for routine post operative check.    This is 1st post operative visit.    The patient states doing better, did get it wet twice, but wife changed dressing and said it looked ok  Patient relates no new problems/complaints.       The patient states of mild pain.    The patient is complainant to best of his ability  Currently instructed post-op weightbearing status is weight bearing with cane  The patient denies of and fever/chill, SOB/CP and calf pain.    Patient denies of side effects of the prescribed medication.    Current post-op pain medication is Tylenol. As needed  The pain is well controlled.    DVT prophylaxis:  81 Aspirin bid

## 2019-08-08 NOTE — Patient Instructions (Addendum)
continue to betadine paint was applied along the incision and 4 x 4 with ace bandage until there is no drainage then he can start apply abx ointment qd.

## 2019-08-08 NOTE — Progress Notes (Signed)
Post-Surgery Follow-Up SOAP Note    Patient: Ronnie Mason   Patient DOB: June 04, 1951     DOS:  08/08/2019     Accompanied by:  patient was unaccompanied    Subjective:    Procedure:  Retained orthopaedic hardware  RIGHT  PO week #: 2 week  post op.  Date of surgery:  07/25/19    Ronnie Mason  is a 69 year old year old male return today for routine post operative check.  This is 2nd post operative visit.  The patient states doing better.  It did get it wet twice, but wife changed dressing and said it looked ok.  Patient relates no new problems/complaints.    The patient states of mild pain on surgical site.  The patient is complainant to best of his ability.  Currently instructed post-op weightbearing status is weight bearing with cane.  The patient denies of and fever/chill, SOB/CP and calf pain.  Patient denies of side effects of the prescribed medication.  Current post-op pain medication is Tylenol as needed.  The pain is well controlled.  DVT prophylaxis:  81 Aspirin bid.  Although he states he is walking in the post-op shoe, in our clinic we observe walking in barefoot with socks.     PMH:   Past Medical History:   Diagnosis Date   . Carotid Sinus Hypersensitivity    . Disc disorder of lumbosacral region    . Hip injury    . HYPERTENSION     . HYPOTENSION     . Intraparenchymal hemorrhage of brain (HCC) 2017   . Pain of right hip joint 01/23/2016   . 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 Syncope    . 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(HCC)    . URI (upper respiratory infection)         Review of patient's allergies indicates:  Allergies   Allergen Reactions   . Adhesives Rash   . Bee Venom Hives, Itching and Swelling   . Gabapentin      Flu like symptoms, diarrhea, body ache upset stomach   . Methocarbamol Other     Patient's wife reports cognitive difficulties ("goofy")        Medications:   Outpatient Medications Prior to Visit   Medication Sig Dispense Refill   .  Acetaminophen 500 MG Oral Tab 2 tablets bid     . albuterol HFA 108 (90 Base) MCG/ACT inhaler Inhale 2 puffs by mouth every 4 hours as needed for shortness of breath/wheezing. 1 Inhaler 2   . Alpha-Lipoic Acid 300 MG tablet Take 1 tablet by mouth daily.     [name] amLODIPine 5 MG tablet Take 1 tablet (5 mg) by mouth 2 times a day. 90 tablet 3   . amoxicillin-clavulanate (Augmentin) 500-125 MG tablet Take 1 tablet (500 mg) by mouth 2 times a day for 10 days. 20 tablet 0   . amoxicillin-clavulanate (Augmentin) 500-125 MG tablet Take 1 tablet (500 mg) by mouth every 12 hours. (Patient not taking: Reported on 05/25/2019) 21 tablet 0   . amoxicillin-clavulanate (Augmentin) 500-125 MG tablet Take 1 tablet (500 mg) by mouth every 12 hours. 20 tablet 0   . atorvastatin 10 MG Oral Tablet Take 1 tablet (10 mg) by mouth daily. 90 tablet 2   . BENFOTIAMINE OR Take 250 mg by mouth daily.     . Cholecalciferol (VITAMIN D3) 2000 units Oral Cap Take 1 capsule (2,000 Units) by mouth  daily. For low level 1 capsule 1   . Cyanocobalamin 1000 MCG Oral Tab one per day over-the-counter started 5/21 /2012 this level borderline low 1 Tab 1   . diclofenac 1 % gel      . DULoxetine 30 MG DR capsule Take 1 capsule (30 mg) by mouth daily. 90 capsule 1   . DULoxetine 60 MG DR capsule Take 1 capsule (60 mg) by mouth daily. 90 capsule 1   . EPINEPHrine 0.3 MG/0.3ML Injection Solution Auto-injector Inject as instructed per patient package insert, 0.3 mg intramuscularly or subcutaneously into the thigh, if needed to treat anaphylaxis 3 each 1   . lidocaine 5 % patch Apply 1 patch onto the skin daily. Apply to painful area for up to 12 hours in a 24 hour period. 30 patch 10   . lisinopril 20 MG tablet Take 1 tablet (20 mg) by mouth every 12 hours. 180 tablet 3   . naloxone (Narcan) 4 MG/0.1ML nasal spray Use 1 spray in one nostril for suspected opioid overdose. Call 911. If unresponsive in 2 to 3 minutes, repeat with new naloxone nasal spray. 2 each 0   .  pregabalin 100 MG capsule Take 1 capsule (100 mg) by mouth 3 times a day. 270 capsule 1   . pregabalin 50 MG capsule 1 po tid 270 capsule 1   . primidone 50 MG tablet Take 1 tablet (50 mg) by mouth 2 times a day. 180 tablet 1   . solifenacin 5 MG tablet TAKE ONE TABLET BY MOUTH ONCE DAILY 90 tablet 0   . thiamine 100 MG tablet Take 1 tablet by mouth daily.      [name] triamcinolone 0.1 % cream As needed to rear bid x 1 week 15 g 1     No facility-administered medications prior to visit.         Objective:   There were no vitals filed for this visit.     The surgical sites were examined and the incision(s) has central dehiscence (1cm) with minimal drainage.    There is mild pain on surgical site.  There is mild-moderate edema and is redness peri-incision noted.  There is no evidence of infection/cellulitis.  There is no signs of DVT i.e. no calf pain, swelling and redness.       DIAGNOSTIC IMAGING:    Right:  AP/LAT/MO foot (CPT- 86578) views     Finding:   Post-op x-ray:  S/P hardware remove on the 1st metatarso-phalangeal joint arthrodesis - is/are fused. The osseous alignment is good.    Assessment:   Post op 2 weeks to address (S91.301A) Open wound of right foot, initial encounter  (primary encounter diagnosis)  (Z96.9) Retained orthopaedic hardware     The patient is making set back in progress post-operatively. The patient appears to be NON COMPLIANT regarding post-operative instruction.    Plan:    A discussion took place regarding post-operative progress.  Patient was instructed to call our office if they notice any significant change is post-operative progress and experience severe pain, swelling, fever/chill, calf pain, SOB. A betadine paint was applied along the incision and 4 x 4 with ace bandage which he was instructed to continue to betadine paint was applied along the incision and 4 x 4 with ace bandage until there is no drainage then he can start apply abx ointment qd.  Precaution were stressed today  regards to level of activity/elevation/icing.      We have reviewed x-ray finding  and detail discussion took place with the patient.      Ambulatory status:  The patient was instructed to protective weight bearing using post-op shoe.       Physical therapy:  none.       DVT prophylaxis:  none - patient is ambulating.     Pain management:  The patient was instructed to use rest, ice, elevation, compression and NSAID as primary care for pain and swelling.  At this point we agree to use:  Tylenol and Ibuprofen.     Return/Follow-up:  follow up in 1 - 2 weeks for post op check and follow up for incision check/suture removal    [name] DPM, FACFAS  Reconstructive Ankle and Foot Surgeon  [facility]

## 2019-08-10 ENCOUNTER — Other Ambulatory Visit (INDEPENDENT_AMBULATORY_CARE_PROVIDER_SITE_OTHER): Payer: Self-pay | Admitting: Internal Medicine

## 2019-08-10 DIAGNOSIS — Z8679 Personal history of other diseases of the circulatory system: Secondary | ICD-10-CM

## 2019-08-10 DIAGNOSIS — I10 Essential (primary) hypertension: Secondary | ICD-10-CM

## 2019-08-10 MED ORDER — AMLODIPINE BESYLATE 5 MG OR TABS
5.0000 mg | ORAL_TABLET | Freq: Two times a day (BID) | ORAL | 3 refills | Status: DC
Start: 2019-08-10 — End: 2020-04-01

## 2019-08-10 NOTE — Telephone Encounter (Signed)
amLODIPine 5 MG tablet  Last visit: 07/13/2019  Last CPE: 09/17/17  Next visit: Visit date not found  Last refill: 01/31/2019  Last Prescribed by: Judi Saa, MD

## 2019-08-14 ENCOUNTER — Other Ambulatory Visit (INDEPENDENT_AMBULATORY_CARE_PROVIDER_SITE_OTHER): Payer: Self-pay | Admitting: Internal Medicine

## 2019-08-14 DIAGNOSIS — I10 Essential (primary) hypertension: Secondary | ICD-10-CM

## 2019-08-14 MED ORDER — LISINOPRIL 20 MG OR TABS
20.0000 mg | ORAL_TABLET | Freq: Two times a day (BID) | ORAL | 3 refills | Status: DC
Start: 2019-08-14 — End: 2020-09-04

## 2019-08-14 NOTE — Telephone Encounter (Signed)
lisinopril 20 MG tablet  Last visit: 07/13/2019  Last CPE: Unknown  Next visit: Visit date not found  Last refill: 07/25/18  Last Prescribed by: Judi Saa, MD

## 2019-08-22 ENCOUNTER — Ambulatory Visit (INDEPENDENT_AMBULATORY_CARE_PROVIDER_SITE_OTHER): Payer: Medicare PPO | Admitting: Podiatrist

## 2019-08-22 ENCOUNTER — Telehealth (INDEPENDENT_AMBULATORY_CARE_PROVIDER_SITE_OTHER): Payer: Self-pay | Admitting: Podiatrist

## 2019-08-22 DIAGNOSIS — Z969 Presence of functional implant, unspecified: Secondary | ICD-10-CM

## 2019-08-22 DIAGNOSIS — S91301A Unspecified open wound, right foot, initial encounter: Secondary | ICD-10-CM

## 2019-08-22 NOTE — Telephone Encounter (Signed)
L/m for pt that I returned call, will wait for him to call back

## 2019-08-22 NOTE — Progress Notes (Signed)
Post-Surgery Follow-Up SOAP Note    Patient: Ronnie Mason   Patient DOB: [birth year]     DOS:  08/22/2019     Accompanied by:  patient was unaccompanied    Subjective:    Procedure:Retained orthopaedic hardwareRIGHT  PO week #:4 weekpost op. Date of surgery:07/25/19    Ronnie Mason is a 69 year old year old malereturn today for routine post operative check. This is3rdpost operative visit. Patient relatesthinks we need to put in more sutures, area is red and inflamed looking.  He states its been that way, looks better today he says then it has. The patient states ofno painon surgical site. It does Itch a lot, which could be why it gets irritated, he scratches it at night with his other foot. Hoping some ointment might help.The patient iscompliant as "best of his ability."Currently instructed post-op weightbearing status isweight bearing with cane.The patientdenies of and fever/chill, SOB/CP and calf pain. Patientdenies of side effects of the prescribed medication. Current post-op pain medication isTylenolas needed.The painis well controlled. DVT prophylaxis:81 Aspirin bid. Although he states he is walking in the post-op shoe, in our clinic we observe walking in barefoot with socks. Checking on sutures, see if they look well enough to take out.    PMH:   Past Medical History:   Diagnosis Date   . Carotid Sinus Hypersensitivity    . Disc disorder of lumbosacral region    . Hip injury    . HYPERTENSION     . HYPOTENSION     . 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 Pain of right hip joint 01/23/2016   . 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 Syncope    . 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([place])    . URI (upper respiratory infection)         Review of patient's allergies indicates:  Allergies   Allergen Reactions   . Adhesives Rash   . Bee Venom Hives, Itching and Swelling   . Gabapentin       Flu like symptoms, diarrhea, body ache upset stomach   . Methocarbamol Other     Patient's wife reports cognitive difficulties ("goofy")        Medications:   Outpatient Medications Prior to Visit   Medication Sig Dispense Refill   . Acetaminophen 500 MG Oral Tab 2 tablets bid     . albuterol HFA 108 (90 Base) MCG/ACT inhaler Inhale 2 puffs by mouth every 4 hours as needed for shortness of breath/wheezing. 1 Inhaler 2   . Alpha-Lipoic Acid 300 MG tablet Take 1 tablet by mouth daily.     [name] amLODIPine 5 MG tablet Take 1 tablet (5 mg) by mouth 2 times a day. 90 tablet 3   . amoxicillin-clavulanate (Augmentin) 500-125 MG tablet Take 1 tablet (500 mg) by mouth every 12 hours. (Patient not taking: Reported on 05/25/2019) 21 tablet 0   . amoxicillin-clavulanate (Augmentin) 500-125 MG tablet Take 1 tablet (500 mg) by mouth every 12 hours. 20 tablet 0   . atorvastatin 10 MG Oral Tablet Take 1 tablet (10 mg) by mouth daily. 90 tablet 2   . BENFOTIAMINE OR Take 250 mg by mouth daily.     . Cholecalciferol (VITAMIN D3) 2000 units Oral Cap Take 1 capsule (2,000 Units) by mouth daily. For low level 1 capsule 1   . Cyanocobalamin 1000 MCG Oral Tab one per day over-the-counter started 5/21 /2012 this level borderline low 1  Tab 1   . diclofenac 1 % gel      . DULoxetine 30 MG DR capsule Take 1 capsule (30 mg) by mouth daily. 90 capsule 1   . DULoxetine 60 MG DR capsule Take 1 capsule (60 mg) by mouth daily. 90 capsule 1   . EPINEPHrine 0.3 MG/0.3ML Injection Solution Auto-injector Inject as instructed per patient package insert, 0.3 mg intramuscularly or subcutaneously into the thigh, if needed to treat anaphylaxis 3 each 1   . lidocaine 5 % patch Apply 1 patch onto the skin daily. Apply to painful area for up to 12 hours in a 24 hour period. 30 patch 10   . lisinopril 20 MG tablet Take 1 tablet (20 mg) by mouth every 12 hours. 180 tablet 3    . naloxone (Narcan) 4 MG/0.1ML nasal spray Use 1 spray in one nostril for suspected opioid overdose. Call 911. If unresponsive in 2 to 3 minutes, repeat with new naloxone nasal spray. 2 each 0   . pregabalin 100 MG capsule Take 1 capsule (100 mg) by mouth 3 times a day. 270 capsule 1   . pregabalin 50 MG capsule 1 po tid 270 capsule 1   . primidone 50 MG tablet Take 1 tablet (50 mg) by mouth 2 times a day. 180 tablet 1   . solifenacin 5 MG tablet TAKE ONE TABLET BY MOUTH ONCE DAILY 90 tablet 0   . thiamine 100 MG tablet Take 1 tablet by mouth daily.      [name] triamcinolone 0.1 % cream As needed to rear bid x 1 week 15 g 1     No facility-administered medications prior to visit.         Objective:   There were no vitals filed for this visit.     The surgical sites were examined and the incision(s) has/have dehiscence (1cm on dorsal 1st MTPJ area with 79m gap) and inflamed skin on peri-incision area.  It does not appear that cellulitis.  There is 2-3 mm granulation tissue on proximal incision site.  However, it looks better than before. There is mild-moderate pain on palpation on proximal pole of the incision which he states at neuropathy pain.  There is moderate edema.  There is no abscess.  The hallux is in satisfactory alignment. There is no signs of DVT i.e. no calf pain, swelling and redness.       Assessment:   Post op 4 weeks to address (S91.301A) Open wound of right foot, initial encounter  (primary encounter diagnosis)  (Z96.9) Retained orthopaedic hardware     The patient is making fair progress post-operatively with small dehiscence and inflammation due to scratching.      Plan:     A discussion took place regarding post-operative progress - slight improvement from prior visit.  Patient was instructed to call our office if they notice any significant change is post-operative progress and experience severe pain, swelling, fever/chill, calf pain, SOB.  Using sterile suture kit, sutures were removed without complication and Antibiotic ointment (Mupirocin with band aid was applied and continue till heal.  Precaution were stressed today regards importance of incision care.  At this point, I do not recommend corticosteroid due to incision dehiscence.     Ambulatory status:  The patient was instructed to progressive gradual weight bearing as tolerated using post-op shoe to stable shoes.       Physical therapy:  none.       DVT prophylaxis:  none - patient  is ambulating.     Pain management:  The patient was instructed to use rest, ice, elevation, compression and NSAID as primary care for pain and swelling.  At this point we agree to use:  Tylenol.     Return/Follow-up:  follow up in 1-2 weeks for wound check    Vira Browns DPM, FACFAS  Reconstructive Ankle and Foot Surgeon  [facility]

## 2019-08-22 NOTE — Telephone Encounter (Signed)
General Message:    Detailed Message: Patient would like a call back to discuss his foot.   Return Call: Detailed message on voicemail

## 2019-08-22 NOTE — Patient Instructions (Addendum)
Apply (Polysporin or Mupirocin) to the wound bid till heal.

## 2019-08-22 NOTE — Progress Notes (Signed)
Post-Surgery Follow-Up SOAP Note    Patient: Ronnie Mason   Patient DOB: March 07, 1951     DOS:  08/22/2019     Accompanied by:  patient was unaccompanied    Subjective:    Procedure:Retained orthopaedic hardwareRIGHT  PO week #:4 weekpost op. Date of surgery:07/25/19    [name] is a 69 year old year old malereturn today for routine post operative check. This is 3rd post operative visit. The patient statesdoing better.  Patient relatesthinks we need to put in more sutures, area is red and inflamed looking, states its been that way, looks better today he says then it has.. The patient states ofno pain on surgical site. It does Itch a lot, which could be why it gets irritated, he itches it at night with his other foot. Hoping some ointment might help.The patient iscomplainant to best of his ability.  Currently instructed post-op weightbearing status isweight bearing with cane.  The patientdenies of and fever/chill, SOB/CP and calf pain. Patientdenies of side effects of the prescribed medication. Current post-op pain medication isTylenol as needed.  The painis well controlled. DVT prophylaxis:81 Aspirin bid.  Although he states he is walking in the post-op shoe, in our clinic we observe walking in barefoot with socks. Checking on sutures, see if they look well enough to take out.

## 2019-08-30 ENCOUNTER — Encounter (HOSPITAL_BASED_OUTPATIENT_CLINIC_OR_DEPARTMENT_OTHER): Payer: Self-pay

## 2019-08-30 ENCOUNTER — Ambulatory Visit (INDEPENDENT_AMBULATORY_CARE_PROVIDER_SITE_OTHER): Payer: Medicare PPO | Admitting: Podiatrist

## 2019-08-30 DIAGNOSIS — Z969 Presence of functional implant, unspecified: Secondary | ICD-10-CM

## 2019-08-30 DIAGNOSIS — S91301A Unspecified open wound, right foot, initial encounter: Secondary | ICD-10-CM

## 2019-08-30 NOTE — Telephone Encounter (Signed)
Opened in Error.

## 2019-08-30 NOTE — Progress Notes (Signed)
Post-Surgery Follow-Up SOAP Note    Patient: Ronnie Mason   Patient DOB: 1950-10-02     DOS:  08/30/2019     Accompanied by:  patient was unaccompanied    Subjective:    Procedure:Retained orthopaedic hardwareRIGHT  PO week #:5 week post op. Date of surgery:07/25/19    [name] is a 69 year old year old malereturn today for unscheduled post operative check. This is4thpost operative visit.He called today to get pain meds. After questioning, the patient states last few days having lots of pain in foot and up leg, back of calf and up thru lower back. Has no idea why, very difficult for him to walk now, says surgical site looks good. .The patient iscompliant as "best of his ability."Currently instructed post-op weightbearing status isweight bearing with cane.The patientdenies of and fever/chill, SOB/CP and calf pain. Patientdenies of side effects of the prescribed medication. Current post-op pain medication isTylenolas needed.The painis well controlled. taking tylenol 6 a day

## 2019-08-30 NOTE — Progress Notes (Signed)
Post-Surgery Follow-Up SOAP Note    Patient: Ronnie Mason   Patient DOB: Oct 04, 1950     DOS:  08/30/2019     Accompanied by:  patient was unaccompanied    Subjective:    Procedure:Retained orthopaedic hardwareRIGHT  PO week #:5 week post op. Date of surgery:07/25/19    Wendal Wilkie is a 69 year old year old malereturn today for unscheduled post operative check. This is4thpost operative visit.He called today to get pain meds due to severe leg/thigh/back pain.  Currently he is waiting for the foot to heal so that he can receive injection to his back. After questioning, the patient states last few days having lots of pain in foot and up leg, back of calf and up thru lower back. He has no idea why, very difficult for him to walk now, says surgical site looks good.  The patient iscompliantas "best of his ability."Currently instructed post-op weightbearing status isweight bearing with cane.The patientdenies of and fever/chill, SOB/CP and calf pain. Patientdenies of side effects of the prescribed medication. Current post-op pain medication isTylenolas needed.The painis well controlled. taking tylenol 6 a day.     PMH:   Past Medical History:   Diagnosis Date   . Carotid Sinus Hypersensitivity    . Disc disorder of lumbosacral region    . Hip injury    . HYPERTENSION     . HYPOTENSION     . 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 Pain of right hip joint 01/23/2016   . 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 Syncope    . 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([place])    . URI (upper respiratory infection)         Review of patient's allergies indicates:  Allergies   Allergen Reactions   . Adhesives Rash   . Bee Venom Hives, Itching and Swelling   . Gabapentin      Flu like symptoms, diarrhea, body ache upset stomach   . Methocarbamol Other     Patient's wife reports cognitive difficulties ("goofy")        Medications:   Outpatient Medications  Prior to Visit   Medication Sig Dispense Refill   . Acetaminophen 500 MG Oral Tab 2 tablets bid     . albuterol HFA 108 (90 Base) MCG/ACT inhaler Inhale 2 puffs by mouth every 4 hours as needed for shortness of breath/wheezing. 1 Inhaler 2   . Alpha-Lipoic Acid 300 MG tablet Take 1 tablet by mouth daily.     [name] amLODIPine 5 MG tablet Take 1 tablet (5 mg) by mouth 2 times a day. 90 tablet 3   . amoxicillin-clavulanate (Augmentin) 500-125 MG tablet Take 1 tablet (500 mg) by mouth every 12 hours. 21 tablet 0   . amoxicillin-clavulanate (Augmentin) 500-125 MG tablet Take 1 tablet (500 mg) by mouth every 12 hours. (Patient not taking: Reported on 08/30/2019) 20 tablet 0   . atorvastatin 10 MG Oral Tablet Take 1 tablet (10 mg) by mouth daily. 90 tablet 2   . BENFOTIAMINE OR Take 250 mg by mouth daily.     . Cholecalciferol (VITAMIN D3) 2000 units Oral Cap Take 1 capsule (2,000 Units) by mouth daily. For low level 1 capsule 1   . Cyanocobalamin 1000 MCG Oral Tab one per day over-the-counter started 5/21 /2012 this level borderline low 1 Tab 1   . diclofenac 1 % gel      . DULoxetine 30 MG DR  capsule Take 1 capsule (30 mg) by mouth daily. (Patient not taking: Reported on 08/30/2019) 90 capsule 1   . DULoxetine 60 MG DR capsule Take 1 capsule (60 mg) by mouth daily. 90 capsule 1   . EPINEPHrine 0.3 MG/0.3ML Injection Solution Auto-injector Inject as instructed per patient package insert, 0.3 mg intramuscularly or subcutaneously into the thigh, if needed to treat anaphylaxis 3 each 1   . lidocaine 5 % patch Apply 1 patch onto the skin daily. Apply to painful area for up to 12 hours in a 24 hour period. 30 patch 10   . lisinopril 20 MG tablet Take 1 tablet (20 mg) by mouth every 12 hours. 180 tablet 3   . naloxone (Narcan) 4 MG/0.1ML nasal spray Use 1 spray in one nostril for suspected opioid overdose. Call 911. If unresponsive in 2 to 3 minutes, repeat with new naloxone nasal spray. 2 each 0   . pregabalin 100 MG capsule Take 1  capsule (100 mg) by mouth 3 times a day. 270 capsule 1   . pregabalin 50 MG capsule 1 po tid 270 capsule 1   . primidone 50 MG tablet Take 1 tablet (50 mg) by mouth 2 times a day. 180 tablet 1   . solifenacin 5 MG tablet TAKE ONE TABLET BY MOUTH ONCE DAILY 90 tablet 0   . thiamine 100 MG tablet Take 1 tablet by mouth daily.      [name] triamcinolone 0.1 % cream As needed to rear bid x 1 week 15 g 1     No facility-administered medications prior to visit.         Objective:   There were no vitals filed for this visit.     The surgical sites were examined and the incision(s) has/have healed with thin scab on the previous dehiscence site.  The surgical scar(s) is bound down to subcutaneous tissue.  There is erythema along surgical site which is typical for him but not evidence infection.  The scab was removed and there is complete healing of the incision.  There is mild pain on incision.  There is mild swelling noted.  Clinically, the post-operative alignments is fair.   There is no signs of DVT i.e. no calf pain, swelling and redness.       Assessment:   Post op 5 weeks - hardware removal.     (S91.301A) Open wound of right foot  - COMPLETELY HEALED - He may see the doctor for back injection.   (Z96.9) Retained orthopaedic hardware    The patient is making good progress post-operatively. The patient appears to be compliant regarding post-operative instruction.    Plan:    A discussion took place regarding post-operative progress.  Patient was instructed to call our office if they notice any significant change is post-operative progress and experience severe pain, swelling, fever/chill, calf pain, SOB.    Precaution were stressed today regards to level of activity/elevation/icing.  At this point, does not need to apply abx ointment.       Ambulatory status:  The patient was instructed to return to regular activity as tolerated using stable shoes.       Detailed education and discussion took place with the patient regards to  skin adhesion.  Patient was informed of following treatment:  100% silicone gel and Cica care.           Return/Follow-up:  PRN or symptom worsen or fail to improve    [name] DPM,  FACFAS  Reconstructive Ankle and Foot Surgeon  [facility]

## 2019-09-04 ENCOUNTER — Encounter (HOSPITAL_BASED_OUTPATIENT_CLINIC_OR_DEPARTMENT_OTHER): Payer: Self-pay

## 2019-09-06 ENCOUNTER — Encounter (INDEPENDENT_AMBULATORY_CARE_PROVIDER_SITE_OTHER): Payer: Medicare PPO | Admitting: Podiatrist

## 2019-09-07 ENCOUNTER — Ambulatory Visit: Payer: Medicare PPO | Attending: Anesthesiology | Admitting: Anesthesiology

## 2019-09-07 DIAGNOSIS — M47816 Spondylosis without myelopathy or radiculopathy, lumbar region: Secondary | ICD-10-CM | POA: Insufficient documentation

## 2019-09-07 DIAGNOSIS — M545 Low back pain, unspecified: Secondary | ICD-10-CM

## 2019-09-07 DIAGNOSIS — G8929 Other chronic pain: Secondary | ICD-10-CM | POA: Insufficient documentation

## 2019-09-07 DIAGNOSIS — M48061 Spinal stenosis, lumbar region without neurogenic claudication: Secondary | ICD-10-CM | POA: Insufficient documentation

## 2019-09-07 DIAGNOSIS — M7918 Myalgia, other site: Secondary | ICD-10-CM | POA: Insufficient documentation

## 2019-09-07 NOTE — Progress Notes (Signed)
TELE-HEALTH VISIT    Context of the visit  This encounter was provided by the [facility] via video teleconferencing.  The provider, Renea Ee, MD, was located at [facility] accompanied by Sydell Axon.  The patient was located at home and was accompanied by his wife by phone, per patient report.    I have explained to the patient that:  . This method of communication has been selected in the context of the current COVID-19 global health crisis, to minimize travel and interpersonal interactions and preserve the health of patients, providers and our community;  . There are different risks with this method, including concerns regarding confidentiality, which we have worked to create a secure environment for our communication,  and the inability to perform a physical examination;  . The encounter is not being recorded and that permission is not granted for recording.    The patient expressed understanding and agreed.    CHIEF COMPLAINT:   Chief Complaint   Patient presents with   . Extremity Pain   . Back Pain       HISTORY OF PRESENT ILLNESS:  The purpose of today's visit is interval reevaluation of lower back pain. The patient has a pain history significant for significant for bilateral lumbar facet arthropathy with prior positive response to RFA though most recent diagnostic injections for evaluation for repeat RFA did not provide impressive response. The patient was last seen by Dr. Araceli Bouche on 06/08/19 at which time we recommended continuing physical therapy, using self massage, stopping memantine, and continuing pregabalin, duloxetine, acetaminophen. We also recommended follow up with spine surgeon and obtained repeat MRI in preparation.    His treatment has been complicated by ongoing issues with poor healing from a R foot 1 MTP arthrodesis in the context of poor compliance with weightbearing instructions. He has been walking more easily now that  his foot has been fixed. He still uses a cane for balance. However, recently he even forgot his can at pharmacy counter which is a good sign. Over the weekend he did have severe pain in his left L4, L5, S1 area. He also has pain on the right thought it is not as severe.         Labs/Imaging Reviewed:  MRI Lumbar spine 07/11/19:  "L1-2:  Circumferential disc bulge and mild bilateral facet arthropathy. Mild   bilateral foraminal narrowing. No spinal canal stenosis.   L2-3:  Circumferential disc bulge and mild bilateral facet   arthropathy/ligament flavum hypertrophy. Mild left-sided foraminal narrowing.   No spinal canal stenosis.   L3-4:  Circumferential disc bulge and moderate bilateral facet   arthropathy/ligamentum flavum hypertrophy. Increased moderate spinal canal   stenosis with crowding of the cauda equina nerve roots mild right and moderate   left-sided foraminal narrowing.   L4-5:  Circumferential disc bulge and moderate bilateral facet   arthropathy/ligament flavum hypertrophy. Increased mild spinal canal stenosis   with narrowing of the right greater than left subarticular recesses. Moderate   bilateral foraminal narrowing.   L5-S1: Disc bulge, mild bilateral facet arthropathy and epidural lipomatosis.   This results in increased moderate spinal canal stenosis. Moderate bilateral   foraminal narrowing.    Compared to the MRI of 08/10/2016, interval progression of multilevel   degenerative change and epidural lipomatosis which results in worsening   moderate spinal canal stenosis at L3-L4 and L5-S1 with crowding of the cauda   equina nerve roots and multilevel  moderate foraminal stenosis."      PHYSICAL EXAMINATION:  There were no vitals taken for this visit.   Constitutional: He is diaphoretic. Voice tremulous. Ruddy complexion.  Eyes: Negative for miosis bilaterally. Pupils appropriate for room lighting. Extraocular movements are intact bilaterally  Psychiatric: Judgement/insight are good. Mood is good.  Affect is normal. Patient is oriented to person, place and time    IMPRESSION:   Ronnie Mason is a 69 year old male with   1.Axial low back painsecondary tofacet arthropathy at L4-L5,L5-S1 with prior positive response to RFA(last in 2017) as well as lumbar facet arthropathy. More recent diagnostic medial branch blocks have not been helpful so do not plan to proceed to RFA again. Also has histoy of central stenosis and is now describing lower extremity radicular symptoms.  2. R leg/foot pain laterally in settingmallet toe and peroneal brevis and longus tear diagnosed by podiatry-now s/pR foot 1 MTP arthrodesis 6/11/20with challenges with wound healing  3. Prior treatment failures including:Short duration of benefit for interlaminar lumbar epidural steroids and TFESI, memantine  4. Multiple prior injections by outside providers (exluding MBB and trigger point injections):   Dr. Hinda Kehr  ? 03/23/2018 -LEFT L5 TFESI with 80-85%relief for about 3-4 weeksof just axial back pain.  ? 09/15/2017 -Bilateral L5TFESI with 50-75% reliefof mixture of low back and leg pain.  ? 05/19/2017 -RIGHT L5 TFESIwith 100% relief ofRadicularsymptoms for about 3 months  ? 08/12/2016 - L5-S1 ILESI  ? 07/01/2016 - Ultrasound guided right trochanteric bursa injection   ? 05/20/2016 - Bilateral shoulder injections  ? 05/13/2016 - Right L3 L4 L5/S1 RFA   ? 02/19/2016 - Left L3, L4, L5/S1 RFA   ? 12/27/2012 - L5-S1 IL ESI  ? 08/31/2012 - Left L5 TFESI   ? 03/30/2012 - Left L4/5, L5/S1 facet inj  ? 02/10/2012 - Left C6 C7 MB RFA  ? 07/03/2011 - Left L3, L4, L5/S1 RFA  ? 05/12/2011 - Left C4, C5, C6 RFA  5. 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    If ineffective will resconsider diagnostic medical branch blocks, possibly adding an additional level.    Continue pregabalin 150 mg TID, duloxetine 60 mg qam and 30 mg pm, acetaminophen prn.    The medical student performed portions of the interview.    I spent a total of 30 minutes for the patient's care on the date of the service.

## 2019-09-15 ENCOUNTER — Encounter (HOSPITAL_BASED_OUTPATIENT_CLINIC_OR_DEPARTMENT_OTHER): Payer: Self-pay

## 2019-10-04 NOTE — Progress Notes (Signed)
PROCEDURE NOTE: TRIGGER POINT INJECTIONS    PROCEDURE  Trigger point injection x 10 to the bilateral low lumbar back and gluteal muscles    PRE-PROCEDURE DIAGNOSIS  (M79.18) Myofascial pain  (primary encounter diagnosis)  (M54.5,  G89.29) Chronic bilateral low back pain, unspecified whether sciatica present.    POST-PROCEDURE DIAGNOSIS  (M79.18) Myofascial pain  (primary encounter diagnosis)  (M54.5,  G89.29) Chronic bilateral low back pain, unspecified whether sciatica present.    INDICATION FOR INJECTION  Chronic myofascial pain of the bilateral low lumbar back and gluteal muscles     ATTENDING PHYSICIAN:  [name], MD    FELLOW / RESIDENT:  Emi Belfast, MD    SEDATION:  None    NPO STATUS:  None    INTRAVENOUS LINE: None    DESCRIPTION OF THE PROCEDURE  Position: Prone  Monitoring: Pulse oximetry    Final Verification/Time-out: Performed and documented.  Preparation: The injection area was prepped with Chloraprep.    Palpation was used to identify trigger points in the bilateral low lumbar back and gluteal muscles which reproduced the patient's myofascial pain symptoms. These areas were marked.    Needle used: 25g, 2 inch.  At each trigger point, 1 ml of Bupivicaine 0.5%  was injected in a fan-like manner.    The following muscles were injected:    [x]  bilateral gluteus maximus/medius  [x]  bilateral quadratus lumborum   [x]  bilateral latissimus dorsi    [x]  bilateral erector spinae    The needle was removed uneventfully and hemostasis was maintained.     POST-PROCEDURE:  He was escorted to the recovery area in stable condition where he made an uneventful recovery.    COMPLICATIONS:  none     PLAN  Referral was placed for PT at an external PT location to address patient's balance and gait.   Follow-up in clinic with Dr. .

## 2019-10-06 ENCOUNTER — Ambulatory Visit: Payer: Medicare PPO | Attending: Anesthesiology | Admitting: Anesthesiology

## 2019-10-06 VITALS — BP 134/86 | HR 94 | Temp 97.2°F | Resp 16

## 2019-10-06 DIAGNOSIS — G8929 Other chronic pain: Secondary | ICD-10-CM

## 2019-10-06 DIAGNOSIS — M545 Low back pain, unspecified: Secondary | ICD-10-CM

## 2019-10-06 DIAGNOSIS — M7918 Myalgia, other site: Secondary | ICD-10-CM | POA: Insufficient documentation

## 2019-10-06 NOTE — Progress Notes (Signed)
I was continuously present for the entire procedure (trigger point injections under ultrasound) which was performed under my direct personal supervision.   I reviewed the documentation of the other providers and concur with Dr. Teola Bradley findings.  Imaging guidance was used for this procedure and representative images were saved. I personally viewed these images.

## 2019-10-06 NOTE — Patient Instructions (Signed)
[facility]  Post-Procedure Discharge Instructions      After the procedure:  • Immediate and complete pain relief is rare  • Numbness and/or weakness in the area of your body supplied by the injected nerve; these symptoms should resolve but may last up to several hours  • Some soreness and bruising at the injection site(s)    Activities:  • If you have any weakness or numbness caused by the injection, DO NOT DRIVE or operate machinery and limit other activity until sensation returns to normal.  • You may resume regular exercises/activities as tolerated.  • If you received sedation, DO NOT DRIVE or operate machinery for 24 hours.    Medications:  If you stopped taking any blood thinning medications such as Coumadin or Plavix, you may resume these tomorrow unless specified differently by the prescribing physician.    Site care:  • You may remove the band-aid after 6 hours.  • You may shower today. No swimming, tub baths or hot tubs for 24 hours following your procedure.  • For the first 48 hours, apply ice packs to the injection site for 15-20 minutes hourly as needed for comfort.  Wrap a light towel or cloth around ice packs and heating pad to protect the skin.  • After 48 hours, use a warm heating pad to the injection site for 15-20 minutes hourly as needed for comfort.    If you received steroids today:  • Steroid medications may cause facial flushing, occasional low-grade fevers, hiccups, insomnia, headaches, water retention, increased appetite, increased heart rate, and abdominal cramping or bloating. These side effects occur in only about 5 percent of patients and commonly disappear within one to three days after the injection.  • If you are diabetic check your blood sugar more frequently than usual as you may develop an increase in blood sugar for the next 10-14 days. Contact your diabetes physician if this occurs.    Call us if you develop any of the following symptoms in the next 7  days:  • Fever above 100 degrees F     • Any unusual increase in your level of pain  • Swelling, bleeding, redness, or increased tenderness at the procedure or IV site  • Headache not relieved by Tylenol (if you had an epidural steroid injection)  .    Contact us:  Anytime, 24 hours/day, 7 days/week at [phone]    [facility]  Patient Self-Administered 6 Hour Pain Diary    1.  For accuracy keep this diary with you during the 6 hour period so you can write down your pain scores as you go, at the time noted on your diary.  During the 6 hours, please document your pain scores while doing activities that provoke your pain as well as when you are not doing those activities.      2.  Within 24 hours of completing this pain score diary, please send us your pain score diary either by:  1) Scanning into [facility]  2)  Faxing to [phone] or 3)  Calling the pain score voicemail line at [phone].  Leave the spelling of your first and last name,  U#, date of birth, date of service and the 20 numbers you wrote below, including pre-procedure, immediately post procedure and % pain relief.  After that, you may leave any comments regarding your post block experience that may be helpful (e.g. was able to walk, much worse, had to take   medications).      Use this scale to rate your pain  0 1 2 3 4 5 6 7 8 9 10  No pain                                          worst pain     TIME PAIN SCORE WITH PAINFUL ACTIVITY PAIN SCORE WITHOUT PAINFUL ACTIVITY   Before injections:       Immediately after injections:       30 minutes after injections:       1 hour after injections:       2 hours after injections:       3 hours after injections:       4 hours after injections:       5 hours after injections:       6 hours after injections:       When I had the lowest pain score, my % (percentage) pain relief:          IT IS ESSENTIAL YOU CALL IN YOUR PAIN DIARY RESULTS BEFORE ANY FURTHER PROCEDURE(S) CAN BE SCHEDULED.

## 2019-10-06 NOTE — Progress Notes (Signed)
Ronnie Mason identified by name and DOB.   He confirmed he is having a Bilateral Lower back TPI 1 of 3  He ambulated to procedure area with a cane  He has fallen in the last 6 months.  Fall prevention interventions: Patient provided with non-skid stockings  He denies taking any blood thinners.  He denies having a bleeding disorder.  He  Has held his medications as directed on yellow form.  He denies a history of fainting during medical procedures.   He has not been NPO.  He undressed self without assistance.  Is He receiving a steroid injection today? No  If yes, has He received the COVID-19 vaccine (either brand) within the past 2 weeks? NA  OR does He plan to receive the COVID-19 vaccine (either brand) within the next 2 weeks?NA  I verbally reviewed written discharge instructions and pain diary with him.   Confirmed ride home with his    Daughter Ronnie Mason [phone]  He has a pre-procedure pain score of 8    Pain score prior to procedure:  8/10  Pain score after procedure:  7/10    Ronnie Mason met discharge criteria:  A/OX4/baseline, VSS/returned to baseline, ambulatory with steady gait. Site clean dry and intact.  Written and verbal discharge instructions, including emergency contact phone numbers, reviewed with Ronnie Mason. Verbal understanding obtained from him.  Ronnie Mason dressed self without assistance. He was discharged in stable condition, ambulatory with steady gait to home with all belongings and with ride/responsible person.

## 2019-10-18 ENCOUNTER — Telehealth (INDEPENDENT_AMBULATORY_CARE_PROVIDER_SITE_OTHER): Payer: Self-pay | Admitting: Nurse Practitioner

## 2019-10-18 NOTE — Telephone Encounter (Signed)
Outreach to patient to schedule Annual Wellness Visit.  Insurer: [employer] for Ronnie Mason to schedule AWV.

## 2019-10-19 NOTE — Progress Notes (Signed)
PROCEDURE NOTE: TRIGGER POINT INJECTIONS  10/20/2019  PROCEDURE  Trigger point injection of the bilateral low back region    PRE-PROCEDURE DIAGNOSIS  (M54.5) Back pain, lumbosacral  (primary encounter diagnosis)  (M79.18) Myofascial pain.    POST-PROCEDURE DIAGNOSIS  (M54.5) Back pain, lumbosacral  (primary encounter diagnosis)  (M79.18) Myofascial pain.    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     ATTENDING PHYSICIAN:  [name], MD    FELLOW / RESIDENT:  Emi Belfast, MD.    SEDATION:  NO    NPO STATUS:  NO    INTRAVENOUS LINE  NO    Guidance:  Ultrasound guidance used for this procedure, appropriate images viewed and saved to the medical record.    DESCRIPTION OF THE PROCEDURE  Position: prone  Monitoring: standard  Final Verification/Time-out: was performed and documented.  Preparation: The injection area was prepped with Chloraprep.  Imaging for needle placement: 5-16MHz Transducer   Needle used: 25g, 2 inch.  Needle insertion: Out-of plane.  Imaging conditions: Good visualization of the muscle planes.  Injection of 15 ml of Bupivacaine 0.5% without epinephrine  at 14 trigger points localized in muscle groups below..    The following muscles were injected:  1. Latissimus dorsi (bilateral)  2. Erector spinae (bilateral)  3. Multifidus of low lumbar spine (bilateral)  4. Gluteus medius/maximus (bilateral)    Uneventful removal of the needle.    POST-PROCEDURE:  He was escorted to the recovery area in stable condition where he made an uneventful recovery.    Mr. Sindelar was reassessed after the procedure.  The pain score was  4/10 before the procedure and 3/10 prior to discharge (0 = no pain, 10 = worst pain imaginable).  Mr. Chauca did note initial improvement in pain, including pain with movement.     COMPLICATIONS:  none     PLAN  Mr. Azzarello  was instructed to keep his pain diary and report the results of this procedure.  Subsequent care will be based on the  results of today's procedure.    Heloise Purpura, MD

## 2019-10-20 ENCOUNTER — Telehealth (HOSPITAL_BASED_OUTPATIENT_CLINIC_OR_DEPARTMENT_OTHER): Payer: Self-pay

## 2019-10-20 ENCOUNTER — Ambulatory Visit: Payer: Medicare PPO | Attending: Anesthesiology | Admitting: Anesthesiology

## 2019-10-20 VITALS — BP 138/90 | HR 100 | Temp 98.8°F | Resp 12

## 2019-10-20 DIAGNOSIS — M7918 Myalgia, other site: Secondary | ICD-10-CM

## 2019-10-20 DIAGNOSIS — M545 Low back pain, unspecified: Secondary | ICD-10-CM

## 2019-10-20 NOTE — Patient Instructions (Signed)
[facility]  Post-Procedure Discharge Instructions      After the procedure:  • Immediate and complete pain relief is rare  • Numbness and/or weakness in the area of your body supplied by the injected nerve; these symptoms should resolve but may last up to several hours  • Some soreness and bruising at the injection site(s)    Activities:  • If you have any weakness or numbness caused by the injection, DO NOT DRIVE or operate machinery and limit other activity until sensation returns to normal.  • You may resume regular exercises/activities as tolerated.  • If you received sedation, DO NOT DRIVE or operate machinery for 24 hours.    Medications:  If you stopped taking any blood thinning medications such as Coumadin or Plavix, you may resume these tomorrow unless specified differently by the prescribing physician.    Site care:  • You may remove the band-aid after 6 hours.  • You may shower today. No swimming, tub baths or hot tubs for 24 hours following your procedure.  • For the first 48 hours, apply ice packs to the injection site for 15-20 minutes hourly as needed for comfort.  Wrap a light towel or cloth around ice packs and heating pad to protect the skin.  • After 48 hours, use a warm heating pad to the injection site for 15-20 minutes hourly as needed for comfort.    If you received steroids today:  • Steroid medications may cause facial flushing, occasional low-grade fevers, hiccups, insomnia, headaches, water retention, increased appetite, increased heart rate, and abdominal cramping or bloating. These side effects occur in only about 5 percent of patients and commonly disappear within one to three days after the injection.  • If you are diabetic check your blood sugar more frequently than usual as you may develop an increase in blood sugar for the next 10-14 days. Contact your diabetes physician if this occurs.    Call us if you develop any of the following symptoms in the next 7  days:  • Fever above 100 degrees F     • Any unusual increase in your level of pain  • Swelling, bleeding, redness, or increased tenderness at the procedure or IV site  • Headache not relieved by Tylenol (if you had an epidural steroid injection)  .    Contact us:  Anytime, 24 hours/day, 7 days/week at [phone]    [facility]  Patient Self-Administered 6 Hour Pain Diary    1.  For accuracy keep this diary with you during the 6 hour period so you can write down your pain scores as you go, at the time noted on your diary.  During the 6 hours, please document your pain scores while doing activities that provoke your pain as well as when you are not doing those activities.      2.  Within 24 hours of completing this pain score diary, please send us your pain score diary either by:  1) Scanning into [facility]  2)  Faxing to [phone] or 3)  Calling the pain score voicemail line at [phone].  Leave the spelling of your first and last name,  U#, date of birth, date of service and the 20 numbers you wrote below, including pre-procedure, immediately post procedure and % pain relief.  After that, you may leave any comments regarding your post block experience that may be helpful (e.g. was able to walk, much worse, had to take   medications).      Use this scale to rate your pain  0 1 2 3 4 5 6 7 8 9 10  No pain                                          worst pain     TIME PAIN SCORE WITH PAINFUL ACTIVITY PAIN SCORE WITHOUT PAINFUL ACTIVITY   Before injections:       Immediately after injections:       30 minutes after injections:       1 hour after injections:       2 hours after injections:       3 hours after injections:       4 hours after injections:       5 hours after injections:       6 hours after injections:       When I had the lowest pain score, my % (percentage) pain relief:          IT IS ESSENTIAL YOU CALL IN YOUR PAIN DIARY RESULTS BEFORE ANY FURTHER PROCEDURE(S) CAN BE SCHEDULED.

## 2019-10-20 NOTE — Telephone Encounter (Signed)
Procedure MD:  Dr. Araceli Bouche  Procedure:  TPI   Date of Procedure:  10/06/2019    Pain Scores:    TIME PAIN SCORE WITH PAINFUL ACTIVITY PAIN SCORE WITHOUT PAINFUL ACTIVITY   Before injections:   10 8   Immediately after injections:   - 7   30 minutes after injections:   - 7   1 hour after injections:   - 6   2 hours after injections:   - 6   3 hours after injections:   - 5   4 hours after injections:   - 4   5 hours after injections:   - 4   6 hours after injections:   - 3   When I had the lowest pain score, my % (percentage) pain relief:  -% 65%     Ronnie Mason's Comments:

## 2019-10-20 NOTE — Progress Notes (Signed)
I was continuously present for the entire procedure (trigger point injection under ultrasound) which was performed under my direct personal supervision.   I reviewed the documentation of the other providers and concur with Dr. Teola Bradley findings.  Imaging guidance was used for this procedure and representative images were saved. I personally viewed these images.

## 2019-10-20 NOTE — Progress Notes (Signed)
[name] identified by name and DOB.   He confirmed he is having a Bilateral Lower back TPI Series of 3, 2 of 3  He ambulated to procedure area using a cane.  He has not fallen in the last 6 months.  Fall prevention interventions: Patient provided with non-skid stockings  He denies taking any blood thinners.  He denies having a bleeding disorder.  He  Has held his medications as directed on yellow form.  He denies a history of fainting during medical procedures.   He has not been NPO.  He undressed self without assistance.  Is He receiving a steroid injection today? no  If yes, has He received the COVID-19 vaccine (either brand) within the past 2 weeks?   OR does He plan to receive the COVID-19 vaccine (either brand) within the next 2 weeks?  I verbally reviewed written discharge instructions and pain diary with him.   Confirmed ride home with his daughter [name] [phone]     He has a pre-procedure pain score of 4/10 bilateral lower back    Pain score prior to procedure:  4/10  Pain score after procedure:  3/10    Mr. [name] met discharge criteria:  A/OX4/baseline, VSS/returned to baseline, ambulatory with steady gait. Site clean dry and intact.  Written and verbal discharge instructions, including emergency contact phone numbers, reviewed with Mr. [name]. Verbal understanding obtained from him.  Mr. [name] dressed self without assistance. He was discharged in stable condition, ambulatory with steady gait to home with all belongings and with ride/responsible person.

## 2019-10-24 ENCOUNTER — Other Ambulatory Visit (INDEPENDENT_AMBULATORY_CARE_PROVIDER_SITE_OTHER): Payer: Self-pay | Admitting: Podiatrist

## 2019-10-24 ENCOUNTER — Encounter (INDEPENDENT_AMBULATORY_CARE_PROVIDER_SITE_OTHER): Payer: Self-pay | Admitting: Podiatrist

## 2019-10-24 NOTE — Telephone Encounter (Signed)
General Message:    Detailed Message:   Phone call from patient. He would like a call back to discus the photos he sent. Please advise   Return Call: Detailed message on voicemail

## 2019-10-25 ENCOUNTER — Telehealth (INDEPENDENT_AMBULATORY_CARE_PROVIDER_SITE_OTHER): Payer: Self-pay | Admitting: Podiatrist

## 2019-10-25 NOTE — Telephone Encounter (Signed)
General Message:    Detailed Message:   Patient is requesting a call back. Please advise   Return Call: Detailed message on voicemail

## 2019-10-25 NOTE — Telephone Encounter (Signed)
Talked to pt, we scheduled him to see Dr Willow Ora tomorrow for his blister

## 2019-10-25 NOTE — Telephone Encounter (Signed)
General Message:    Detailed Message:   Phone call from patient. He is requesting a call back from Diane. Please advise   Return Call: Detailed message on voicemail

## 2019-10-26 ENCOUNTER — Telehealth (INDEPENDENT_AMBULATORY_CARE_PROVIDER_SITE_OTHER): Payer: Medicare PPO | Admitting: Podiatrist

## 2019-10-26 ENCOUNTER — Ambulatory Visit (INDEPENDENT_AMBULATORY_CARE_PROVIDER_SITE_OTHER): Payer: Medicare PPO | Admitting: Foot & Ankle Surgery

## 2019-10-26 ENCOUNTER — Ambulatory Visit (INDEPENDENT_AMBULATORY_CARE_PROVIDER_SITE_OTHER): Payer: Medicare PPO

## 2019-10-26 ENCOUNTER — Encounter (INDEPENDENT_AMBULATORY_CARE_PROVIDER_SITE_OTHER): Payer: Self-pay

## 2019-10-26 ENCOUNTER — Encounter (INDEPENDENT_AMBULATORY_CARE_PROVIDER_SITE_OTHER): Payer: Medicare PPO | Admitting: Podiatrist

## 2019-10-26 VITALS — Temp 97.0°F

## 2019-10-26 DIAGNOSIS — S90421A Blister (nonthermal), right great toe, initial encounter: Secondary | ICD-10-CM

## 2019-10-26 NOTE — Progress Notes (Signed)
Accompanied by:  patient was unaccompanied    Referring provider/PCP:  Seen in past  The location of the condition is right. Big toe blister   The patient states that the condition has existed for few day(s).    It began acutely.    The precipitating event was started friday, got worse by Sunday, skin came off, blister popped.    Patient describes the symptoms as sore and tender.    Patient denies: injury, cracking.    The quality of pain aching.    The level the pain mild.    The condition is is not getting better.    The affected area is made worse by pressure, walking.    Past treatments/studies include nothing .    The patient states no improvement with treatment.    Previous diagnostic test/evaluation:  none.    Patient's limitation is activities are limited in walking.      Occupation: Retired    Activity level: limited, back issues, feet     Goals: evaluate current condition.

## 2019-10-26 NOTE — Progress Notes (Deleted)
Ronnie Mason  PCP:  [name], MD    Chief Complaint:  No chief complaint on file.      SUBJECTIVE:  ***      OBJECTIVE:  ***      ASSESSMENT:        Lanier Prude, DPM  [facility]    Portions of this document were created with voice-recognition software and may contain occasional errant or misspelled words.

## 2019-10-26 NOTE — Progress Notes (Signed)
Ronnie Mason was seen today through telemed arrangements with [name].  As per [name] consultation I accommodated his insole that he has.  I was able to carve out a distal first digit accommodation to this.  I was able to accommodate this quite deep utilizing his inserts.  He will try this and is scheduled to follow back with [name] in approximately 2 weeks.  He was instructed to put this into his walking boot that he has at home.  I spent less than 5 minutes in the accommodation of this insert.  Today's visit with me is a no charge visit.

## 2019-10-26 NOTE — Progress Notes (Signed)
[facility] Telemedicine Encounter    Patient: Ronnie Mason   Patient DOB: [birth year]     DOS:  10/26/2019     I conducted this encounter from my home via secure, live, face-to-face video conference with the patient.   Ronnie Mason was located at our clinic with spouse.  Prior to the interview, the risks and benefits of telemedicine were discussed with the patient and verbal consent was obtained.     Chief Complaint: Ronnie Mason is a 69 year old year old male who presents today with complaint(s):  New blister on right hallux    Subjective:   (history is obtained by MA/RN)  Referring provider/PCP:  Seen in past    The location of the condition is right big toe blister, plantar.  The patient states that the condition has existed for few day(s).  It began acutely.  The precipitating event was started Friday, got worse by Sunday, skin came off, blister popped.  Patient describes the symptoms as sore and tender with pressure/walking.  Patient denies: injury, cracking, fever/chill, infection.  The quality of pain aching.  The level the pain mild.  The condition is is not getting better.  The affected area is made worse by pressure, walking.  Past treatments/studies include apply antidotic ointment.  The patient states no improvement with treatment.  Previous diagnostic test/evaluation:  none.  Patient's limitation is activities are limited in walking.      Occupation: Retired    Activity level: limited, back issues, feet     Goals: evaluate current condition    PCP:  Donnal Moat, MD     PMH:   Past Medical History:   Diagnosis Date   . Carotid Sinus Hypersensitivity    . Disc disorder of lumbosacral region    . Hip injury    . HYPERTENSION     . HYPOTENSION     . 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 Pain of right hip joint 01/23/2016   . 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 Syncope    . 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([place])    . URI (upper  respiratory infection)         Review of patient's allergies indicates:  Allergies   Allergen Reactions   . Adhesives Skin: Rash   . Bee Venom Skin: Hives, Skin: Itching and Swelling   . Gabapentin      Flu like symptoms, diarrhea, body ache upset stomach   . Methocarbamol Other     Patient's wife reports cognitive difficulties ("goofy")        Medications:   Outpatient Medications Prior to Visit   Medication Sig Dispense Refill   . Acetaminophen 500 MG Oral Tab 2 tablets bid     . albuterol HFA 108 (90 Base) MCG/ACT inhaler Inhale 2 puffs by mouth every 4 hours as needed for shortness of breath/wheezing. 1 Inhaler 2   . Alpha-Lipoic Acid 300 MG tablet Take 1 tablet by mouth daily.     [name] amLODIPine 5 MG tablet Take 1 tablet (5 mg) by mouth 2 times a day. 90 tablet 3   . amoxicillin-clavulanate (Augmentin) 500-125 MG tablet Take 1 tablet (500 mg) by mouth every 12 hours. (Patient not taking: Reported on 10/06/2019) 21 tablet 0   . amoxicillin-clavulanate (Augmentin) 500-125 MG tablet Take 1 tablet (500 mg) by mouth every 12 hours. (Patient not taking: Reported on 08/30/2019) 20 tablet 0   .  atorvastatin 10 MG Oral Tablet Take 1 tablet (10 mg) by mouth daily. 90 tablet 2   . BENFOTIAMINE OR Take 250 mg by mouth daily.     . Cholecalciferol (VITAMIN D3) 2000 units Oral Cap Take 1 capsule (2,000 Units) by mouth daily. For low level 1 capsule 1   . Cyanocobalamin 1000 MCG Oral Tab one per day over-the-counter started 5/21 /2012 this level borderline low 1 Tab 1   . diclofenac 1 % gel      . DULoxetine 30 MG DR capsule Take 1 capsule (30 mg) by mouth daily. 90 capsule 1   . DULoxetine 60 MG DR capsule Take 1 capsule (60 mg) by mouth daily. 90 capsule 1   . EPINEPHrine 0.3 MG/0.3ML Injection Solution Auto-injector Inject as instructed per patient package insert, 0.3 mg intramuscularly or subcutaneously into the thigh, if needed to treat anaphylaxis 3 each 1   . lidocaine 5 % patch Apply 1 patch onto the skin daily. Apply to  painful area for up to 12 hours in a 24 hour period. 30 patch 10   . lisinopril 20 MG tablet Take 1 tablet (20 mg) by mouth every 12 hours. 180 tablet 3   . naloxone (Narcan) 4 MG/0.1ML nasal spray Use 1 spray in one nostril for suspected opioid overdose. Call 911. If unresponsive in 2 to 3 minutes, repeat with new naloxone nasal spray. 2 each 0   . pregabalin 100 MG capsule Take 1 capsule (100 mg) by mouth 3 times a day. 270 capsule 1   . pregabalin 50 MG capsule 1 po tid 270 capsule 1   . primidone 50 MG tablet Take 1 tablet (50 mg) by mouth 2 times a day. 180 tablet 1   . solifenacin 5 MG tablet TAKE ONE TABLET BY MOUTH ONCE DAILY 90 tablet 0   . thiamine 100 MG tablet Take 1 tablet by mouth daily.      [name] triamcinolone 0.1 % cream As needed to rear bid x 1 week (Patient not taking: Reported on 10/06/2019) 15 g 1     No facility-administered medications prior to visit.         PSH:   Past Surgical History:   Procedure Laterality Date   . ANES; COLONOSCOPY  2002    repeat in 3 years   . ANES; COLONOSCOPY & POLYPECTOMY  11/18/2007    repeat in 3 years   . HALLUX RIGIDUS W/CHEILECTOMY 1ST MP JT W/O IMPLT Right 10/21/2017    Dr. Donn Pierini   . L meniciscus Left 07/2017    by Dr Marcelle Overlie , re did left menisicus    . TOE SURGERY Right 02/09/2019   . toe surgery  Right 01/12/2019    Dr Hilarie Fredrickson    . UNLISTED PROCEDURE FEMUR/KNEE     . UNLISTED PROCEDURE HANDS/FINGERS     . UNLISTED PROCEDURE SPINE  2013    rfa to c3 to c 7         Family History:  family history includes Colon Cancer in his father; Heart (other) in his mother; Other Family Hx in an other family member.    Social History:  Social History     Socioeconomic History   . Marital status: Married     Spouse name: Not on file   . Number of children: Not on file   . Years of education: Not on file   . Highest education level: Not on file   Occupational History   .  Not on file   Social Needs   . Financial resource strain: Not on file   . Food insecurity     Worry:  Not on file     Inability: Not on file   . Transportation needs     Medical: Not on file     Non-medical: Not on file   Tobacco Use   . Smoking status: Never Smoker   . Smokeless tobacco: Former Estate agent and Sexual Activity   . Alcohol use: Yes   . Drug use: No   . Sexual activity: Not on file   Lifestyle   . Physical activity     Days per week: Not on file     Minutes per session: Not on file   . Stress: Not on file   Relationships   . Social Wellsite geologist on phone: Not on file     Gets together: Not on file     Attends religious service: Not on file     Active member of club or organization: Not on file     Attends meetings of clubs or organizations: Not on file     Relationship status: Not on file   . Intimate partner violence     Fear of current or ex partner: Not on file     Emotionally abused: Not on file     Physically abused: Not on file     Forced sexual activity: Not on file   Other Topics Concern   . 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  Non-smoker, 14 or more drinks/week almost exclusively wine with dinner.               Review of Systems   ROS is positive for musculoskeletal as mentioned in the HPI and as stated in the past medical history.  The patient's review of symptoms is otherwise unremarkable and documented in the electronic health record on 10/26/2019.    Physical Exam:   Vitals:    10/26/19 1434   Temp: 97 F (36.1 C)        General: The patient is White Well developed, appearing stated age and in no acute distress,  normal affect.     On video, he has partial deroofed blister on plantar hallux without ulceration.  The hallux is swollen but no evidence of infection/cellulitiis.      Assessment:   (C94.709G) Blister of great toe of right foot, initial encounter  (primary encounter diagnosis)    Plan:  Detailed education and discussion took place with the patient regards to current  diagnoses/impression - mechanically induced blister, right hallux. The patient was given an opportunity to ask questions.  We discuss importance of offloading.  We have discuss follow options: avoid barefoot, wear postop shoes, obtain accommodative Quickstride we can fabricate for him today, custom orthotic.  He does not need oral abx since he has no signs/symptoms of infection.   After the discussion, today patient agree to try avoid barefoot, wear postop shoes, obtain accommodative Quickstride.  He was instructed to call if there is any signs and symptoms of infection.      Quickstride insert order:  The patient was educated about the purpose of the device.  All questions were answered and no guarantee was given.  Patient was seen by Roe Coombs (pedorthist) for evaluation and dispense of Quickstride insert with accommodative  pad to hallux.       Return appointment:  follow up in 1-2 weeks    TIME SPENT:  15 minutes was spent face-to-face with patient, greater than 50% of which was spent counseling/coordinating care. They were given an opportunity to ask questions which were answered in a detailed manner.    [name] DPM, FACFAS  Reconstructive Ankle and Foot Surgeon  [facility]

## 2019-11-02 NOTE — Progress Notes (Signed)
PROCEDURE NOTE: TRIGGER POINT INJECTIONS UNDER ULTRASOUND GUIDANCE    PROCEDURE  Trigger point injection x 14 to the bilateral  Lower back and thoracic back  Procedure peformed by: Attending physician    PRE-PROCEDURE DIAGNOSIS  (M79.18) Myofascial pain  (primary encounter diagnosis).    POST-PROCEDURE DIAGNOSIS  (M79.18) Myofascial pain  (primary encounter diagnosis).    INDICATION FOR INJECTION  Myofascial pain of the lower back    ATTENDING PHYSICIAN:  [name], MD    FELLOW / RESIDENT:  none    SEDATION:  None    NPO STATUS:  None    INTRAVENOUS LINE: None    Guidance: Ultrasound guidance used for this procedure, appropriate images viewed and saved to the medical record    DESCRIPTION OF THE PROCEDURE  Position: Prone  Monitoring: Pulse oximetry    Final Verification/Time-out: Performed and documented.  Preparation: The injection area was prepped with Chloraprep.  Imaging for needle placement: Transducer placed over the  region.  Needle insertion: Out-of plane.  Imaging conditions: Good visualization of the muscle planes.  Needle used: 25g, 2 inch.  At each trigger point, 0.7 ml of an injectate consisting of Bupivicaine 0.5% x 47mL + DepoMedorl 40 mg/mL x 1 mL was injected for a total of 10 mL.    The following muscles were injected:    [x]  bilateral latissimus dorsi   [x]  bilateral quadratus lumborum   [x]  bilateral erector spinae   [x]  bilateral multifidus at spinal level T7, T8     The needle was removed uneventfully and hemostasis was maintained.     POST-PROCEDURE:  He was escorted to the recovery area in stable condition where he made an uneventful recovery.    COMPLICATIONS:  none     PLAN  Follow up in clinic

## 2019-11-03 ENCOUNTER — Ambulatory Visit: Payer: Medicare PPO | Attending: Anesthesiology | Admitting: Anesthesiology

## 2019-11-03 VITALS — BP 112/74 | HR 88 | Temp 97.5°F | Resp 12

## 2019-11-03 DIAGNOSIS — M7918 Myalgia, other site: Secondary | ICD-10-CM | POA: Insufficient documentation

## 2019-11-03 MED ORDER — METHYLPREDNISOLONE ACETATE 40 MG/ML IJ SUSP
40.0000 mg | Freq: Once | INTRAMUSCULAR | Status: AC
Start: 2019-11-03 — End: 2019-11-03
  Administered 2019-11-03: 40 mg via INTRAMUSCULAR

## 2019-11-03 NOTE — Progress Notes (Signed)
Mr. Lamoureaux identified by name and DOB.   He confirmed he is having a Bilateral Lower back TPI Series of 3, 3 of 3  He ambulated to procedure area using a cane.  He has not fallen in the last 6 months.  Fall prevention interventions: Patient provided with non-skid stockings  He denies taking any blood thinners.  He denies having a bleeding disorder.  He  Has held his medications per provider instructions.  He denies a history of fainting during medical procedures.   He has not been NPO.  He undressed self without assistance.  Is He receiving a steroid injection today? No  If yes, has He received the COVID-19 vaccine (either brand) within the past 2 weeks? No  OR does He plan to receive the COVID-19 vaccine (either brand) within the next 2 weeks?No  I verbally reviewed written discharge instructions and pain diary with him.   Confirmed ride home with his daughter Lyla Son [phone]     He has a pre-procedure pain score of 8/10

## 2019-11-03 NOTE — Patient Instructions (Addendum)
[facility]  Post-Procedure Discharge Instructions      After the procedure:  ? Immediate and complete pain relief is rare  ? Numbness and/or weakness in the area of your body supplied by the injected nerve; these symptoms should resolve but may last up to several hours  ? Some soreness and bruising at the injection site(s)    Activities:  ? If you have any weakness or numbness caused by the injection, DO NOT DRIVE or operate machinery and limit other activity until sensation returns to normal.  ? You may resume regular exercises/activities as tolerated.  ? If you received sedation, DO NOT DRIVE or operate machinery for 24 hours.    Medications:  If you stopped taking any blood thinning medications such as Coumadin or Plavix, you may resume these tomorrow unless specified differently by the prescribing physician.    Site care:  ? You may remove the band-aid after 6 hours.  ? You may shower today. No swimming, tub baths or hot tubs for 24 hours following your procedure.  ? For the first 48 hours, apply ice packs to the injection site for 15-20 minutes hourly as needed for comfort.  Wrap a light towel or cloth around ice packs and heating pad to protect the skin.  ? After 48 hours, use a warm heating pad to the injection site for 15-20 minutes hourly as needed for comfort.    If you received steroids today:  ? Steroid medications may cause facial flushing, occasional low-grade fevers, hiccups, insomnia, headaches, water retention, increased appetite, increased heart rate, and abdominal cramping or bloating. These side effects occur in only about 5 percent of patients and commonly disappear within one to three days after the injection.  ? If you are diabetic check your blood sugar more frequently than usual as you may develop an increase in blood sugar for the next 10-14 days. Contact your diabetes physician if this occurs.    Call us if you develop any of the following symptoms in the next 7 days:  ?  Fever above 100 degrees F     ? Any unusual increase in your level of pain  ? Swelling, bleeding, redness, or increased tenderness at the procedure or IV site  ? Headache not relieved by Tylenol (if you had an epidural steroid injection)  .    Contact us:  Anytime, 24 hours/day, 7 days/week at [phone]    [facility]  Patient Self-Administered Pain Diary  1 month        The procedure you just had was done in hopes of providing long term pain relief. Depending on the type of procedure you had, you will need to answer the questions below 1 month after your procedure.  Accurate completion and timely reporting of your pain diary(s) enables your Pain MD to review the results and make further treatment recommendations.    1 month after your procedure, write your answers to the 4 questions below and send your pain score diary to us either by:      1)  Sending a picture via eCare    2)  Faxing to [phone]  3)  Calling the pain score voicemail line at [phone].  Leave the spelling of your first and last name, U#, date of birth and date of procedure and the answers to all of the 4 questions below.             Use this scale to rate your   pain  0 1 2 3 4 5 6 7 8 9 10  No pain                      worst pain        Procedure Date: 11/03/19    1.  Pain score immediately before procedure: __________    2. 1 month after your procedure, do you feel better?  YES/NO    3.  If you answered YES to the above, by what percentage__________% (0-100%) has your pain been relieved.    4.  Pain Score now: _________ (at time of telephone call)      [facility]  Patient Self-Administered Pain Diary  3 months        The procedure you just had was done in hopes of providing long term pain relief. Depending on the type of procedure you had, you will need to answer the questions below 3 months after your procedure.  Accurate completion and timely reporting of your pain diary(s) enables your Pain MD to review the  results and make further treatment recommendations.    3 months after your procedure, write your answers to the 4 questions below and send your pain score diary to us either by:      1)  Sending a picture via eCare    2)  Faxing to [phone]  3)  Calling the pain score voicemail line at [phone].  Leave the spelling of your first and last name, U#, date of birth and date of procedure and the answers to all of the 4 questions below.             Use this scale to rate your pain  0 1 2 3 4 5 6 7 8 9 10  No pain                      worst pain        Procedure Date: 11/03/19    1.  Pain score immediately before procedure: __________    2. 3 months after your procedure, do you feel better?  YES/NO    3.  If you answered YES to the above, by what percentage__________% (0-100%) has your pain been relieved.    4.  Pain Score now: _________ (at time of telephone call)

## 2019-11-03 NOTE — Progress Notes (Signed)
Pain score prior to procedure:  8/10  Pain score after procedure:  "better"    Patient was in normal sinus rhythm during the entire procedure.  Ronnie Mason met discharge criteria:  A/OX4/baseline, VSS/returned to baseline, ambulatory with steady gait. Site clean dry and intact.  Written and verbal discharge instructions, including emergency contact phone numbers, reviewed with Ronnie Mason. Verbal understanding obtained from him.  Ronnie Mason dressed self without assistance. He was discharged in stable condition, ambulatory with steady gait to home with all belongings and with ride/responsible person.

## 2019-11-05 ENCOUNTER — Other Ambulatory Visit (INDEPENDENT_AMBULATORY_CARE_PROVIDER_SITE_OTHER): Payer: Self-pay | Admitting: Internal Medicine

## 2019-11-05 DIAGNOSIS — M542 Cervicalgia: Secondary | ICD-10-CM

## 2019-11-05 DIAGNOSIS — G894 Chronic pain syndrome: Secondary | ICD-10-CM

## 2019-11-07 ENCOUNTER — Ambulatory Visit (INDEPENDENT_AMBULATORY_CARE_PROVIDER_SITE_OTHER): Payer: Medicare PPO | Admitting: Podiatrist

## 2019-11-07 DIAGNOSIS — S90421A Blister (nonthermal), right great toe, initial encounter: Secondary | ICD-10-CM

## 2019-11-07 DIAGNOSIS — L97501 Non-pressure chronic ulcer of other part of unspecified foot limited to breakdown of skin: Secondary | ICD-10-CM

## 2019-11-07 MED ORDER — DULOXETINE HCL 30 MG OR CPEP
DELAYED_RELEASE_CAPSULE | ORAL | 0 refills | Status: DC
Start: 2019-11-07 — End: 2020-01-25

## 2019-11-07 MED ORDER — DULOXETINE HCL 60 MG OR CPEP
DELAYED_RELEASE_CAPSULE | ORAL | 0 refills | Status: DC
Start: 2019-11-07 — End: 2020-01-25

## 2019-11-07 NOTE — Progress Notes (Signed)
Patient: Rage Beever   Patient DOB: [birth year]     DOS:  [id number]         Chief Complaint: Mr. Finnbar Cedillos is a 69 year old year old male who presents today with complaint(s):  blister on right hallux, recheck     Subjective:   (history is obtained by MA/RN)  Referring provider/PCP:Seen in past    The location of the condition isright big toe blister, plantar.  The patient states that the condition has existed forfewweek(s). It beganacutely. The precipitating event just happened, it still hurts but looking better. Patient describes the symptoms assore and tender with pressure/walking.  Patient denies:injury, cracking, fever/chill, infection.  The quality of painaching. The level the painmild. The condition isis not getting better. The affected area is made worse bypressure, walking. Past treatments/studies includeapply antidotic ointment. The patient statesno improvementwith treatment. Previous diagnostic test/evaluation:none. Patient's limitation is activities arelimited in walking.

## 2019-11-07 NOTE — Progress Notes (Signed)
Patient: Ronnie Mason   Patient DOB: 1950/11/19     DOS:  11/07/2019     Accompanied by:  patient was unaccompanied    Subjective:  [name] is a 69 year old year old malewho presents today with complaint(s):  blister on right hallux, recheck.  The location of the condition isrightbig toe blister, plantar.The patient states that the condition has existed forfewweek(s). It beganacutely. The precipitating event just happened, it still hurts but looking better. Patient describes the symptoms assore and tenderwith pressure/walking.Patient denies:injury, cracking, fever/chill, infection.The quality of painaching. The level the painmild. The condition isis not getting better. The affected area is made worse bypressure, walking. Past treatments/studies includeapply antidotic ointment, Quickstride with modification. The patient statesno improvementwith treatment. Previous diagnostic test/evaluation:none. Patient's limitation is activities arelimited in walking.  He walk on barefoot with sock at home.      Goals: evaluate current condition    PCP:  Donnal Moat, MD     PMH:   Past Medical History:   Diagnosis Date   . Pain of right hip joint 01/23/2016   . 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 Carotid Sinus Hypersensitivity    . Disc disorder of lumbosacral region    . Hip injury    . HYPERTENSION     . HYPOTENSION     . Syncope    . 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([place])    . URI (upper respiratory infection)         Review of patient's allergies indicates:  Allergies   Allergen Reactions   . Adhesives Skin: Rash   . Bee Venom Skin: Hives, Skin: Itching and Swelling   . Gabapentin      Flu like symptoms, diarrhea, body ache upset stomach   . Methocarbamol Other     Patient's wife reports cognitive difficulties ("goofy")        Medications:   Outpatient Medications Prior to Visit    Medication Sig Dispense Refill   . Acetaminophen 500 MG Oral Tab 2 tablets bid     . albuterol HFA 108 (90 Base) MCG/ACT inhaler Inhale 2 puffs by mouth every 4 hours as needed for shortness of breath/wheezing. 1 Inhaler 2   . Alpha-Lipoic Acid 300 MG tablet Take 1 tablet by mouth daily.     [name] amLODIPine 5 MG tablet Take 1 tablet (5 mg) by mouth 2 times a day. 90 tablet 3   . amoxicillin-clavulanate (Augmentin) 500-125 MG tablet Take 1 tablet (500 mg) by mouth every 12 hours. (Patient not taking: Reported on 10/06/2019) 21 tablet 0   . amoxicillin-clavulanate (Augmentin) 500-125 MG tablet Take 1 tablet (500 mg) by mouth every 12 hours. (Patient not taking: Reported on 08/30/2019) 20 tablet 0   . atorvastatin 10 MG Oral Tablet Take 1 tablet (10 mg) by mouth daily. 90 tablet 2   . BENFOTIAMINE OR Take 250 mg by mouth daily.     . Cholecalciferol (VITAMIN D3) 2000 units Oral Cap Take 1 capsule (2,000 Units) by mouth daily. For low level 1 capsule 1   . Cyanocobalamin 1000 MCG Oral Tab one per day over-the-counter started 5/21 /2012 this level borderline low 1 Tab 1   . diclofenac 1 % gel      . DULoxetine 30 MG DR capsule TAKE ONE CAPSULE BY MOUTH ONCE DAILY 90 capsule 0   . DULoxetine 60 MG DR capsule  TAKE ONE CAPSULE BY MOUTH ONCE DAILY 90 capsule 0   . EPINEPHrine 0.3 MG/0.3ML Injection Solution Auto-injector Inject as instructed per patient package insert, 0.3 mg intramuscularly or subcutaneously into the thigh, if needed to treat anaphylaxis 3 each 1   . lidocaine 5 % patch Apply 1 patch onto the skin daily. Apply to painful area for up to 12 hours in a 24 hour period. 30 patch 10   . lisinopril 20 MG tablet Take 1 tablet (20 mg) by mouth every 12 hours. 180 tablet 3   . naloxone (Narcan) 4 MG/0.1ML nasal spray Use 1 spray in one nostril for suspected opioid overdose. Call 911. If unresponsive in 2 to 3 minutes, repeat with new naloxone nasal spray. 2 each 0   . pregabalin 100 MG capsule Take 1 capsule (100 mg) by  mouth 3 times a day. 270 capsule 1   . pregabalin 50 MG capsule 1 po tid 270 capsule 1   . primidone 50 MG tablet Take 1 tablet (50 mg) by mouth 2 times a day. 180 tablet 1   . solifenacin 5 MG tablet TAKE ONE TABLET BY MOUTH ONCE DAILY 90 tablet 0   . thiamine 100 MG tablet Take 1 tablet by mouth daily.      [name] triamcinolone 0.1 % cream As needed to rear bid x 1 week (Patient not taking: Reported on 10/06/2019) 15 g 1     No facility-administered medications prior to visit.         PSH:   Past Surgical History:   Procedure Laterality Date   . ANES; COLONOSCOPY  2002    repeat in 3 years   . ANES; COLONOSCOPY & POLYPECTOMY  11/18/2007    repeat in 3 years   . HALLUX RIGIDUS W/CHEILECTOMY 1ST MP JT W/O IMPLT Right 10/21/2017    Dr. Donn Pierini   . L meniciscus Left 07/2017    by Dr Marcelle Overlie , re did left menisicus    . TOE SURGERY Right 02/09/2019   . toe surgery  Right 01/12/2019    Dr Hilarie Fredrickson    . UNLISTED PROCEDURE FEMUR/KNEE     . UNLISTED PROCEDURE HANDS/FINGERS     . UNLISTED PROCEDURE SPINE  2013    rfa to c3 to c 7         Family History:  family history includes Colon Cancer in his father; Heart (other) in his mother; Other Family Hx in an other family member.    Social History:  Social History     Socioeconomic History   . Marital status: Married     Spouse name: Not on file   . Number of children: Not on file   . Years of education: Not on file   . Highest education level: Not on file   Occupational History   . Not on file   Social Needs   . Financial resource strain: Not on file   . Food insecurity     Worry: Not on file     Inability: Not on file   . Transportation needs     Medical: Not on file     Non-medical: Not on file   Tobacco Use   . Smoking status: Never Smoker   . Smokeless tobacco: Former Estate agent and Sexual Activity   . Alcohol use: Yes   . Drug use: No   . Sexual activity: Not on file   Lifestyle   . Physical activity  Days per week: Not on file     Minutes per session: Not on file    . Stress: Not on file   Relationships   . Social Wellsite geologist on phone: Not on file     Gets together: Not on file     Attends religious service: Not on file     Active member of club or organization: Not on file     Attends meetings of clubs or organizations: Not on file     Relationship status: Not on file   . Intimate partner violence     Fear of current or ex partner: Not on file     Emotionally abused: Not on file     Physically abused: Not on file     Forced sexual activity: Not on file   Other Topics Concern   . 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  Non-smoker, 14 or more drinks/week almost exclusively wine with dinner.               Physical Exam:   There were no vitals filed for this visit.     General: The patient is White, Well developed, appearing stated age and in no acute distress      Vascular:   RIGHT:  Dorsalis pedis pulse is palpable.  Posterior tibial pulse is palpable. Capillary filling time note to be immediate  on the distal hallux.      Neurologic:  RIGHT:  The sensation is intact on the foot/ankle.  Motor coordination is intact with normal muscle tone.  There is no manifestations of pathological reflexes noted.      Dermatological:   RIGHT:  Ulcer/wound examination:  Type: Pressure  Possible Etiology:  deformity  Ulcer location:  Plantar hallux  Size:  10 x 5 x 1 millimeters (length, width, depth)  Wound shape:  oval.  Wound staging: state 2: full skin thickness   Wound edge: No erythema.   Wound base: moist  Granular tissue: complete  Tracking: none  Signs of infection(IDSA classification): none  Drainage: no  Odor:  none  Blood supply:  active bleeding  Vascular concern: none - has adequate flow    Musculoskeletal:   RIGHT:  There is mild semirigid mallet toe deformity on great toe.  There is mild-moderate pain at hallux.  There is no redness/erythema noted.  The 1st MTPJ has no motion s/p  fusion.  Toe purchase on stance:  There is no static toe purchase on great toe.  There is dynamic toe purchase on great toe.       Assessment:   (L97.501) Ulcer of foot, limited to breakdown of skin, unspecified laterality (HCC)  (primary encounter diagnosis)  (K74.259D) Blister of great toe of right foot, initial encounter    Plan:  Detailed education and discussion took place with the patient regards to ulcer, right hallux.  We discussed the possible causes of ulceration/wound with patient - walk without support.  Patient was informed of importance of offloading and wound care. Patient was given an opportunity to answer all questions.  I have recommend following treatment: offloading, apply antibiotic ointment bid till heal and wear sandal at home.     Following Sandals are recommended:  Vionic and Hoka.          Return appointment: follow up in 1-2 weeks    [name] DPM, FACFAS  Reconstructive Ankle and Foot Surgeon  [facility]

## 2019-11-22 ENCOUNTER — Encounter (INDEPENDENT_AMBULATORY_CARE_PROVIDER_SITE_OTHER): Payer: Self-pay | Admitting: Podiatrist

## 2019-11-22 ENCOUNTER — Encounter (HOSPITAL_BASED_OUTPATIENT_CLINIC_OR_DEPARTMENT_OTHER): Payer: Self-pay | Admitting: Anesthesiology

## 2019-11-22 ENCOUNTER — Ambulatory Visit: Payer: Medicare PPO | Attending: Anesthesiology | Admitting: Anesthesiology

## 2019-11-22 ENCOUNTER — Encounter (INDEPENDENT_AMBULATORY_CARE_PROVIDER_SITE_OTHER): Payer: Medicare PPO | Admitting: Podiatrist

## 2019-11-22 VITALS — BP 137/81 | HR 89 | Wt 238.0 lb

## 2019-11-22 DIAGNOSIS — G8929 Other chronic pain: Secondary | ICD-10-CM | POA: Insufficient documentation

## 2019-11-22 DIAGNOSIS — M47816 Spondylosis without myelopathy or radiculopathy, lumbar region: Secondary | ICD-10-CM | POA: Insufficient documentation

## 2019-11-22 DIAGNOSIS — M48061 Spinal stenosis, lumbar region without neurogenic claudication: Secondary | ICD-10-CM | POA: Insufficient documentation

## 2019-11-22 DIAGNOSIS — M7918 Myalgia, other site: Secondary | ICD-10-CM | POA: Insufficient documentation

## 2019-11-22 DIAGNOSIS — M545 Low back pain, unspecified: Secondary | ICD-10-CM

## 2019-11-22 DIAGNOSIS — G894 Chronic pain syndrome: Secondary | ICD-10-CM | POA: Insufficient documentation

## 2019-11-22 NOTE — Progress Notes (Addendum)
CHIEF COMPLAINT:   Back pain    HISTORY OF PRESENT ILLNESS:  The purpose of today's visit is interval reevaluation of back pain. The patient has a pain history significant for bilateral lumbar facet arthropathy with prior positive response to RFA, but less response to more recent diagnostic injections. The patient was last seen by myself on 11/03/19 at which time we performed the last in a series of trigger point injections to the lower and thoracic back. We have previously recommended continuing pegabalin, duloxetine, and acetaminophen and recommended follow up with a spine surgery to know options.    He reports his right sided lower back is worsening. His left side is improved since his last trigger point injection which was focused on the left and performed with steroid. He is very frustrated with his level of pain. Walking is becoming much more challenging.    He notes he recently attended the funeral for his wife's father which was very difficult.    The patient has had some issues with an ulcer on his right hallux which was seen by Dr. Maudie Mercury of foot and ankle.      PHYSICAL EXAMINATION:  There were no vitals taken for this visit.   Constitutional:Constitutional: well-groomed and cooperative, occasionally tearful. Diaphoretic   Eyes: Negative for miosis bilaterally. Pupils appropriate for room lighting. Extraocular movements areintact bilaterally  Neurologic: Strength at least antigravity in b/l lower extremities. Weakness of proximal thighs noted when standing from chair  Musculoskeletal: Old cchymosis on right flank without tenderness to palpation of ribcage. Significant lumbar facet loading positive b/l with pain with palpation of b/l lumbar facet joints and thoracic facet joints. Some hypertonicity of b/l thoracic paraspinous muscles and rhomboids. Able to rise from seated to standing position with pushoff and delay. Ambulates with cane. Muscle wasting of bilateral lower legs  Psychiatric: Judgement/insight are  good. Mood is depressed. Affect is tearful. Patient is oriented to person, place and time    IMPRESSION:   Ronnie Mason is a 69 year old male with   1.Axial low back painsecondary tofacet arthropathy at L4-L5,L5-S1 with prior positive response to RFA(last in 2017) as well as lumbar facet arthropathy. More recent diagnostic medial branch blocks have not been helpful so do not plan to proceed to RFA again. Also has histoy of central stenosis and is now describing lower extremity radicular symptoms.  2. R leg/foot pain laterally in settingmallet toe and peroneal brevis and longus tear diagnosed by podiatry-now s/pR foot 1 MTP arthrodesis 6/11/20with challenges with wound healing and recent ulcer  3. Prior treatment failures including:Short duration of benefit forinterlaminar lumbarepidural steroids and TFESI, memantine  4. Multiple prior injections by outside providers(exluding MBB and trigger point injections):   Dr. Hinda Kehr  ? 03/23/2018 -LEFT L5 TFESI with 80-85%relief for about 3-4 weeksof just axial back pain.  ? 09/15/2017 -Bilateral L5TFESI with 50-75% reliefof mixture of low back and leg pain.  ? 05/19/2017 -RIGHT L5 TFESIwith 100% relief ofRadicularsymptoms for about 3 months  ? 08/12/2016 - L5-S1 ILESI  ? 07/01/2016 - Ultrasound guided right trochanteric bursa injection   ? 05/20/2016 - Bilateral shoulder injections  ? 05/13/2016 - Right L3 L4 L5/S1 RFA   ? 02/19/2016 - Left L3, L4, L5/S1 RFA   ? 12/27/2012 - L5-S1 IL ESI  ? 08/31/2012 - Left L5 TFESI  ? 03/30/2012 - Left L4/5, L5/S1 facet inj  ? 02/10/2012 - Left C6 C7 MB RFA  ? 07/03/2011 - Left L3, L4, L5/S1 RFA  ?  05/12/2011 - Left C4, C5, C6 RFA  5. 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    Will plan for medial branch blocks with steroid of L3-S1 level bilaterally. Previously not a good candidate for repeat RFA given inconsistent results, but does seem to have continue facet mediated pain based on exam.      I spent a total of 38 minutes for the patient's care on the date of the service.

## 2019-11-22 NOTE — Patient Instructions (Signed)
Increase duloxetine to 60 mg twice a day    We will call you to schedule your procedure.

## 2019-11-28 ENCOUNTER — Encounter (HOSPITAL_BASED_OUTPATIENT_CLINIC_OR_DEPARTMENT_OTHER): Payer: Self-pay

## 2019-11-29 ENCOUNTER — Other Ambulatory Visit (INDEPENDENT_AMBULATORY_CARE_PROVIDER_SITE_OTHER): Payer: Self-pay | Admitting: Internal Medicine

## 2019-11-29 DIAGNOSIS — M542 Cervicalgia: Secondary | ICD-10-CM

## 2019-11-29 NOTE — Telephone Encounter (Signed)
The medication requested is listed as historical in the med list.

## 2019-11-30 NOTE — Progress Notes (Deleted)
DIAGNOSTIC NERVE BLOCK OF LUMBAR FACET JOINTS #1    PROCEDURE  Lumbar Medial Branch blocks  {RIGHT/LEFT:104821} L4/5 and L5/S1 facet joints were blocked with bony targets at L4, L5, and the sacral ala, to block the L3, L4 medial branches, and the L5 dorsal ramus.    PRE-PROCEDURE DIAGNOSIS  (M47.816) Lumbar facet arthropathy  (primary encounter diagnosis).    POST-PROCEDURE DIAGNOSIS  (M47.816) Lumbar facet arthropathy  (primary encounter diagnosis).    INDICATION FOR INJECTION  Ronnie Mason has moderate to severe axial pain which impacts his  function and quality of life and has been present for more than 3 months.  He has had reasonable conservative treatment efforts including physical therapy and medication management as outlined in the CPR note dated ***.  His imaging does not reveal another source of pain and is consistent with a facet source for his pain. His physical examination is consistent with a facet source for his pain.   If two injections on two different sessions lead to substantial pain relief and improvement in function, we would conclude that the tested joints are likely a relevant source of his pain. In this case, radiofrequency ablation of the nerves could be offered as a treatment.    ATTENDING PHYSICIAN: Mal Misty, MD    FELLOW / RESIDENT: Felipa Eth, MD    SEDATION:  {.:106422::"NO"}    NPO STATUS:  {.:106422::"NO"}    INTRAVENOUS LINE  {.:106422::"NO"}    IMAGING TECHNIQUE: Multiplanar fluoroscopic guidance was used for this procedure, appropriate images were saved.     TIME OUT/FINAL VERIFICATION: performed and documented.    POSITION:  Prone    PREP:  ChloraPrep and sterile drapes.  Sterile technique used throughout.     DESCRIPTION OF PROCEDURE  The C-arm was positioned to afford optimal view of {RIGHT/LEFT:104821} L4 & 5 transverse processes and sacral alar notches.  Lidocaine 1% {BICARB:100029}  was used to anesthetize the skin at each level. 22 G 3.5 inch needles with the tips  curved were positioned inferior and lateral to the intersection of the transverse process and pedicle at L4 & L5 {RIGHT/LEFT:12}, and over the sacral alar notch {RIGHT/LEFT:12} . They were advanced under intermittent multiplanar fluoroscopy until they contacted the respective targets.  The position of the needles was optimized from the oblique angle.    Omnipaque-240 was injected, and appropriate spread of contrast was noted.  Local anesthetic, bupivacaine 0.5% 0.5 ml was injected via each needle (total of {1-6:106751} injections).    The needles were withdrawn.    COMPLICATIONS:   ***None.    Representative image(s): ***    POST-PROCEDURE:    He was escorted to the recovery area in stable condition where he  made an uneventful recovery.    Ronnie Mason was reassessed after the procedure.  The pain score was  {Numbers 0-10:107815}/10 before the procedure and {Numbers 0-10:107815}/10 prior to discharge (0 = no pain, 10 = worst pain imaginable).  Ronnie Mason did note initial improvement in pain, including pain with movement.     PLAN  Ronnie Mason  was instructed to keep his  pain diary and report the results of this procedure.  Subsequent care will be based on the results of today's procedure.    Felipa Eth, MD

## 2019-12-01 ENCOUNTER — Telehealth (INDEPENDENT_AMBULATORY_CARE_PROVIDER_SITE_OTHER): Payer: Self-pay | Admitting: Internal Medicine

## 2019-12-01 ENCOUNTER — Encounter (HOSPITAL_BASED_OUTPATIENT_CLINIC_OR_DEPARTMENT_OTHER): Payer: Self-pay

## 2019-12-01 ENCOUNTER — Encounter (HOSPITAL_BASED_OUTPATIENT_CLINIC_OR_DEPARTMENT_OTHER): Payer: Self-pay | Admitting: Anesthesiology

## 2019-12-01 ENCOUNTER — Ambulatory Visit: Payer: Medicare PPO | Attending: Anesthesiology | Admitting: Anesthesiology

## 2019-12-01 DIAGNOSIS — M47816 Spondylosis without myelopathy or radiculopathy, lumbar region: Secondary | ICD-10-CM

## 2019-12-01 MED ORDER — METHYLPREDNISOLONE ACETATE 80 MG/ML IJ SUSP
80.0000 mg | Freq: Once | INTRAMUSCULAR | Status: DC
Start: 2019-12-01 — End: 2019-12-01

## 2019-12-01 MED ORDER — DICLOFENAC SODIUM 1 % EX GEL
4.0000 g | Freq: Four times a day (QID) | CUTANEOUS | 4 refills | Status: AC
Start: 2019-12-01 — End: ?

## 2019-12-01 NOTE — Telephone Encounter (Signed)
diclofenac 1 % gel  Last visit: 07/13/2019  Last CPE: 09/17/2017  Next visit: Visit date not found  Last refill: 11/10/2016  Last Prescribed by: Judi Saa, MD

## 2019-12-01 NOTE — Telephone Encounter (Signed)
Spoke with Ronnie Mason nurse at Dr. Nathaniel Man office and they advised that patient go to ER and or Urgent care as they feared patient had broken rib (s). Patient called here thinking he could just order a Xray over the phone. I informed he needs to go to Urgent Care and ER for immediate assistance. Patient agreeded and will go to be evaluated. Follow up later is recommended.

## 2019-12-01 NOTE — Telephone Encounter (Signed)
RETURN CALL: Voicemail - Detailed Message      SUBJECT:  Orders     ORDERS FOR: X-ray  VERBAL OR FAXED ORDER REQUEST: Through Epic  ADDITIONAL INFORMATION: Patient is calling to request an X-ray order be sent over to the [place] clinic. Please call patient back to let him know that this has been taken care of or if there are any questions or concerns.

## 2019-12-01 NOTE — Progress Notes (Deleted)
Ronnie Mason identified by name and DOB. ***  He confirmed he is having a Bilateral L3-L4, L4-L5 and L5-S1 MBB  He ambulated to procedure area {Ambulating:110661::"with steady gait."}  He has not*** fallen in the last 6 months.  Fall prevention interventions: Patient {Fall Prevention:110653::"provided with non-skid stockings"}  He denies taking any blood thinners.***  He denies having a bleeding disorder.***  He  Has held his medications per provider instructions.***  He denies*** a history of fainting during medical procedures.   He has not*** been NPO.  He undressed self without*** assistance.  Is He receiving a steroid injection today? ***  If yes, has He received the COVID-19 vaccine (either brand) within the past 2 weeks? ***  OR does He plan to receive the COVID-19 vaccine (either brand) within the next 2 weeks?***  PIV inserted ***  I verbally reviewed written discharge instructions and pain diary*** with him.   Confirmed ride home with his ***     He has a pre-procedure pain score of ***    MANEUVERS PAIN ASSESSMENT FOR LUMBAR MEDIAL BRANCH BLOCK    TIME Pain Score at rest: Pain Score:  Bend forward   Pain Score:   Bend backward   Pain Score:  Back Bend;twist right Pain Score:  Back Bend; twist left     Pre-Procedure***    30 minutes post procedure***   *** *** *** *** ***

## 2019-12-01 NOTE — Progress Notes (Signed)
Procedure cancelled as patient reported worsening bruising along his right flank with pain on inspiration. Patient previously had known fall. Given progression of symptoms he was instructed to proceed to urgent care for consideration of xrays and today's procedure was cancelled. I did not see the patient.  This encounter was opened in error

## 2019-12-01 NOTE — Patient Instructions (Signed)
Post-Procedure Instructions with Pain Diary      Post-procedure care:  . Today, you underwent a procedure that may or may not treat your pain, but should help us identify the source of your pain.  . Continue taking your scheduled medications.  . If you experience increasing pain, take your regular short-acting medications.    Site care:  . You may remove the band-aid after a few hours.  . You may shower today. No swimming, tub baths, or hot tubs for 24 hours following your procedure.    Activity recommended during the 1st 6 hours after the procedure:  . Continue normal activities, including those that cause the pain.     . Do not lie down and rest after the procedure.    . Do not drive or operate machinery for 24 hours after your procedure if you were given sedation    Medications:  If you stopped taking any blood thinning medications such as Coumadin or Plavix, you may resume these tomorrow unless specified differently by the prescribing physician.    Follow-up:   You are being sent home with the Patient Self Administered 6 Hour Pain Diary to record your pain scores during the 1st 6 hours after your procedure.  After you have completed your 6 hour pain diary, find the time when you had the lowest pain score meaning the least amount of pain.  Then answer "When I had the lowest pain score= least amount of pain, my pain was relieved by _____%".    If you have pain relief longer than 6 hours after your procedure, please make note of this on the diary every 4-5 hours until the pain returns.  You do not need to stay up and active during this time.     Please call the Pain Score Reporting voice mail at [phone] to report your 6 hour pain diary results.  Your report will be sent to provider to review and make further recommendations.  You may not be scheduled for a follow-up appointment until you have completed this call.    Call us if you develop any of the following symptoms in the next 7 days:  . Fever above 100  degrees F     . Any unusual increase in your level of pain  . Swelling, bleeding, redness, or increased tenderness at the procedure or IV site    Contact us:  Anytime, 24 hours/day, 7 days/week at [phone]          Patient Self-Administered 6 hour Pain Diary    1.  For accuracy, keep this diary with you during the 6 hour period so you can write down your pain scores as you go, at the time noted on your diary.  During the 6 hours, please your pain scores while doing activities that provoke your pain as well as when you are not doing those activities.      2.  Within 24 hours of completing this pain score diary, please send us your pain score diary either by:  1)  Scanning into [facility]  2)  Faxing to [phone] or 3)  Calling the pain score voicemail line at [phone].  Leave the spelling of your first and last name,  U#, date of birth, date of service and just the 20 numbers you wrote below, including pre-procedure, immediately post procedure and % pain relief.  After that, you may leave any comments regarding your post block experience that may be helpful (e.g. was able to   walk, much worse, had to take medications).      Use this scale to rate your pain  0 1 2 3 4 5 6 7 8 9 10  No pain                                          worst pain     TIME PAIN SCORE WITH PAINFUL ACTIVITY PAIN SCORE WITHOUT PAINFUL ACTIVITY   Before injections:       Immediately after injections:       30 minutes after injections:       1 hour after injections:       2 hours after injections:       3 hours after injections:       4 hours after injections:       5 hours after injections:       6 hours after injections:       When I had the lowest pain score, my % (percentage) pain relief:          IT IS ESSENTIAL YOU CALL IN YOUR PAIN DIARY RESULTS BEFORE ANY FURTHER PROCEDURE(S) CAN BE SCHEDULED.

## 2019-12-01 NOTE — Telephone Encounter (Signed)
Still open on our end. Please respond.

## 2019-12-02 ENCOUNTER — Ambulatory Visit (INDEPENDENT_AMBULATORY_CARE_PROVIDER_SITE_OTHER): Payer: Medicare PPO | Admitting: Family Medicine

## 2019-12-02 ENCOUNTER — Ambulatory Visit (INDEPENDENT_AMBULATORY_CARE_PROVIDER_SITE_OTHER): Admit: 2019-12-02 | Discharge: 2019-12-02 | Disposition: A | Discharge status: Home / Self Care | Payer: Medicare PPO

## 2019-12-02 ENCOUNTER — Encounter (INDEPENDENT_AMBULATORY_CARE_PROVIDER_SITE_OTHER): Payer: Self-pay | Admitting: Family Medicine

## 2019-12-02 VITALS — BP 102/72 | HR 85 | Temp 97.8°F | Resp 16

## 2019-12-02 DIAGNOSIS — S20211A Contusion of right front wall of thorax, initial encounter: Secondary | ICD-10-CM

## 2019-12-02 NOTE — Progress Notes (Signed)
Chief Complaint   Patient presents with    Rib Injury     2 weeks        Subjective:     Ronnie Mason is a 69 year old male who presents today to the [facility] urgent care clinic for rib injury.  3 weeks ago he was standing on his new deck at his home in the [place] when he slipped.  He was near the railing and he caught himself with his right arm but struck his rib cage on the right side as well.  He had the wind knocked out of him and he was in lots of pain.  The elbow is currently "fine" with normal range of motion and minimal pain.  However he has persistent 5-6 out of 10 pain along the right lateral rib cage.  He denies shortness of breath but taking deep breath makes the pain increase.    Review of Systems   Constitutional: Negative for chills and fever.   Respiratory: Negative for cough and shortness of breath.    Gastrointestinal: Negative for abdominal pain, nausea and vomiting.      Objective:   PHYSICAL EXAM:  BP 102/72    Pulse 85    Temp 36.6 C (Temporal)    Resp 16    SpO2 95%   General: healthy, no distress, alert and oriented x 3  Skin: Skin color normal. No rashes or concerning lesions  Lungs: CTA bilaterally.  Torso: There is a large, 10 x 30 cm bruise along the lower ribs on the right side.    Abdomen:  Right low abdomen with a large 20 x 20 cm bruise, not tender.    Neuro: Grossly intact    X-ray: No rib fractures identified.  Mild left hemidiaphragm elevation.    Assessment and Plan:     (S20.211A) Contusion of rib on right side, initial encounter  (primary encounter diagnosis)  Fall with contusion of right rib cage and elbow 3 weeks ago with persistent pain.  X-ray without fractures that I could identify.  Will advise him of the final results when available by [facility].  He already has pain medications.  He also understands that identifying a fracture does not change the recommendations for management.    Active Ambulatory Problems     Diagnosis Date Noted    Syncope      Hypotension     Carotid Sinus Hypersensitivity     URI (upper respiratory infection)     Cervicalgia 03/11/2010    Hyperlipidemia 09/26/2012    Chronic pain 12/21/2012    Bee sting allergy 12/21/2012    Numbness and tingling of leg 12/21/2012    Chronic back pain 12/21/2012    B12 deficiency 12/21/2012    Chronic renal insufficiency, stage III (moderate) 12/21/2012    Back pain, lumbosacral 02/15/2013    Lumbar radiculopathy, chronic 02/15/2013    Neck pain 02/15/2013    Fall 02/23/2013    Tremor 04/20/2013    Sensory neuropathy 01/18/2014    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 myelopathy or radiculopathy 05/28/2014    Idiopathic peripheral neuropathy 03/15/2015    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 [facility]) 05/07/2015    History of alcohol dependence (HCC) 06/26/2015    Cerebral ventriculomegaly 10/09/2015    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    Essential hypertension 12/16/2015    Essential tremor 12/16/2015    Hx of spontan intraparenchymal intracran bleed assoc with hypertension 12/16/2015    Alcohol abuse  12/16/2015    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) 01/15/2016    Balance problem 01/23/2016    Chronic pain of both shoulders 01/23/2016    Vitamin D deficiency 02/13/2016    Difficulty in walking, not elsewhere classified 03/12/2016    Impaired mobility and ADLs 04/08/2016    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    Primary osteoarthritis of right hip 01/11/2017    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    Right lumbar radiculitis 05/05/2017    Greater trochanteric pain syndrome of right lower extremity 11/25/2017    Complex tear of medial meniscus of left knee as current injury 09/08/2017    Primary osteoarthritis of left knee 09/08/2017    Acute left-sided low back pain without sciatica 03/14/2018     Chronic left-sided low back pain without sciatica 06/21/2018    Enuresis 09/06/2018    Closed fracture of one rib of right side 01/26/2019    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    MRSA (methicillin resistant staph aureus) culture positive 07/13/2019    Myofascial pain 10/20/2019     Resolved Ambulatory Problems     Diagnosis Date Noted    Lumbosacral radiculopathy at S1 03/22/2013    Pain of right hip joint 01/23/2016     Past Medical History:   Diagnosis Date    Disc disorder of lumbosacral region     Hip injury     HYPERTENSION      HYPOTENSION         Patient's Medications   New Prescriptions    No medications on file   Previous Medications    ACETAMINOPHEN 500 MG ORAL TAB    2 tablets bid    ALBUTEROL HFA 108 (90 BASE) MCG/ACT INHALER    Inhale 2 puffs by mouth every 4 hours as needed for shortness of breath/wheezing.    ALPHA-LIPOIC ACID 300 MG TABLET    Take 1 tablet by mouth daily.    AMLODIPINE 5 MG TABLET    Take 1 tablet (5 mg) by mouth 2 times a day.    AMOXICILLIN-CLAVULANATE (AUGMENTIN) 500-125 MG TABLET    Take 1 tablet (500 mg) by mouth every 12 hours.    AMOXICILLIN-CLAVULANATE (AUGMENTIN) 500-125 MG TABLET    Take 1 tablet (500 mg) by mouth every 12 hours.    ATORVASTATIN 10 MG ORAL TABLET    Take 1 tablet (10 mg) by mouth daily.    BENFOTIAMINE OR    Take 250 mg by mouth daily.    CHOLECALCIFEROL (VITAMIN D3) 2000 UNITS ORAL CAP    Take 1 capsule (2,000 Units) by mouth daily. For low level    CYANOCOBALAMIN 1000 MCG ORAL TAB    one per day over-the-counter started 5/21 /2012 this level borderline low    DICLOFENAC 1 % GEL        DICLOFENAC 1 % GEL    Apply 4 g topically 4 times a day. Apply to 2 gram four times daily to neck, trapezius muscle, and low back. Use a max of 32 grams a day.    DULOXETINE 30 MG DR CAPSULE    TAKE ONE CAPSULE BY MOUTH ONCE DAILY    DULOXETINE 60 MG DR CAPSULE    TAKE ONE CAPSULE BY MOUTH ONCE DAILY    EPINEPHRINE 0.3 MG/0.3ML INJECTION SOLUTION  AUTO-INJECTOR    Inject as instructed  per patient package insert, 0.3 mg intramuscularly or subcutaneously into the thigh, if needed to treat anaphylaxis    LIDOCAINE 5 % PATCH    Apply 1 patch onto the skin daily. Apply to painful area for up to 12 hours in a 24 hour period.    LISINOPRIL 20 MG TABLET    Take 1 tablet (20 mg) by mouth every 12 hours.    NALOXONE (NARCAN) 4 MG/0.1ML NASAL SPRAY    Use 1 spray in one nostril for suspected opioid overdose. Call 911. If unresponsive in 2 to 3 minutes, repeat with new naloxone nasal spray.    PREGABALIN 100 MG CAPSULE    Take 1 capsule (100 mg) by mouth 3 times a day.    PREGABALIN 50 MG CAPSULE    1 po tid    PRIMIDONE 50 MG TABLET    Take 1 tablet (50 mg) by mouth 2 times a day.    SOLIFENACIN 5 MG TABLET    TAKE ONE TABLET BY MOUTH ONCE DAILY    THIAMINE 100 MG TABLET    Take 1 tablet by mouth daily.     TRIAMCINOLONE 0.1 % CREAM    As needed to rear bid x 1 week   Modified Medications    No medications on file   Discontinued Medications    No medications on file       Review of patient's allergies indicates:  Allergies   Allergen Reactions    Adhesives Skin: Rash    Bee Venom Skin: Hives, Skin: Itching and Swelling    Gabapentin      Flu like symptoms, diarrhea, body ache upset stomach    Methocarbamol Other     Patient's wife reports cognitive difficulties ("goofy")

## 2019-12-06 ENCOUNTER — Other Ambulatory Visit (INDEPENDENT_AMBULATORY_CARE_PROVIDER_SITE_OTHER): Payer: Self-pay | Admitting: Internal Medicine

## 2019-12-06 DIAGNOSIS — G250 Essential tremor: Secondary | ICD-10-CM

## 2019-12-07 MED ORDER — PRIMIDONE 50 MG OR TABS
ORAL_TABLET | ORAL | 1 refills | Status: DC
Start: 2019-12-07 — End: 2020-06-07

## 2019-12-13 ENCOUNTER — Other Ambulatory Visit (INDEPENDENT_AMBULATORY_CARE_PROVIDER_SITE_OTHER): Payer: Self-pay | Admitting: Internal Medicine

## 2019-12-13 DIAGNOSIS — G609 Hereditary and idiopathic neuropathy, unspecified: Secondary | ICD-10-CM

## 2019-12-14 MED ORDER — PREGABALIN 50 MG OR CAPS
ORAL_CAPSULE | ORAL | 1 refills | Status: DC
Start: 2019-12-14 — End: 2020-06-07

## 2019-12-14 MED ORDER — PREGABALIN 100 MG OR CAPS
ORAL_CAPSULE | ORAL | 1 refills | Status: DC
Start: 2019-12-14 — End: 2020-06-07

## 2019-12-14 NOTE — Telephone Encounter (Signed)
Please schedule next available with me  telemed ok   Ok refill

## 2019-12-29 ENCOUNTER — Encounter (INDEPENDENT_AMBULATORY_CARE_PROVIDER_SITE_OTHER): Payer: Self-pay | Admitting: Internal Medicine

## 2019-12-29 DIAGNOSIS — F1010 Alcohol abuse, uncomplicated: Secondary | ICD-10-CM

## 2019-12-29 NOTE — Telephone Encounter (Signed)
I left a VM that a referral was done.

## 2019-12-29 NOTE — Telephone Encounter (Signed)
Please advise 

## 2019-12-29 NOTE — Telephone Encounter (Signed)
Referral

## 2020-01-08 ENCOUNTER — Encounter (HOSPITAL_BASED_OUTPATIENT_CLINIC_OR_DEPARTMENT_OTHER): Payer: Self-pay

## 2020-01-09 ENCOUNTER — Ambulatory Visit: Payer: Medicare Other | Attending: Internal Medicine | Admitting: Addiction (Substance Use Disorder)

## 2020-01-09 DIAGNOSIS — F1020 Alcohol dependence, uncomplicated: Secondary | ICD-10-CM

## 2020-01-09 DIAGNOSIS — F1010 Alcohol abuse, uncomplicated: Secondary | ICD-10-CM | POA: Insufficient documentation

## 2020-01-09 NOTE — Progress Notes (Signed)
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  Yes      []  No     If yes, what is the longest period of time?  ________years; ___6_____months; ________days            When?_________________________   Relapse history (report of relapses, what triggers relapse, how long do the relapses last):  Quit working out.       Is there significant preoccupation/craving?      []  Yes      [x]  No            If yes, what are the thoughts or events that evoke?       Recognizes role of secondary substances:      []  Yes      [x]  No   Counselors assessment of clients risk for relapse:      [x]  High      []  Medium      []  Low   Patient aware of interaction between addictive and mental disorders?      [x]  Yes      []  No      []  N/A   Dimensional assessment summary (strengths and needs):  Patient is likely to continue drinking without treatment.     Recommended level of care:  []  Level 0.5 Needs understanding of, or skills to change, current use patterns  []  Level OMT None or manageable with medical monitoring.Methadone maintenance  []  Level I Able to maintain abstinence and pursue recovery goals with minimal support  [x]  Level II.1 High likelihood of relapse or continued use without close monitoring and support  []  Level II.5 High likelihood of relapse or continued use without near-daily monitoring and support  []  Level III.1 Understands relapse dynamics, but needs structure to maintain therapeutic gains  []  Level III.3 Little awareness of relapse issues; needs Level III.3 interventions to prevent continued use  []   Level III.5 No recognition of skills needed to prevent continued use, with dangerous consequences  []  Level III.7 Unable to control use, with dangerous consequences  []  Level IV Problems in this dimension do not qualify the patient for Level IV services       DIMENSION 6:  RECOVERY / LIVING ENVIRONMENT   A.  RECOVERY ENVIRONMENT     Yes No Comments   Family history of chemical dependency  [x]  []  mother   Family supportive of  abstinence  [x]  []     Friends supportive of abstinence  []  [x]     Spouse supportive of abstinence  [x]  []     Living arrangements supportive  [x]  []     Funds for basic needs  [x]  []     Employment opportunities  [x]  []     Safe environment in home/neighborhood  [x]  []     B.  CULTURAL   Do you identify yourself with any particular culture, ethnic background or community?      []  Yes      [x]  No            If yes, explain:     Do you currently identify with any organized religion?      [x]  Yes      []  No      If yes, which:     Were you raised in an organized religion?      [x]  Yes      []  No            If yes, which: Catholic     DIMENSION 6 (continued)   Do you believe in a higher power?      [x]  Yes      []  No   Is there a particular form of support from this community you can use for your recovery?      []  Yes      [x]  No            If yes, explain:         C.  WORK HISTORY   How many jobs held in the last year?     References?     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    []  Yes   []  No   Alcohol/drug-related employment problems:   []  Fired  []  Other (specify): []  Quit []  Absenteeism []  Late []  Used at work []  Diminished productivity   Job-seeking motivation:      []  High      []  Medium      []  Low   Dimensional assessment summary (strengths and needs):  Patient reported that he has family support.     Recommended level of care:  []  Level 0.5 Social support system or significant others increase risk for personal conflict about use  []  Level OMT Supportive recovery environment and/or patient has skills to cope w/o treatment (Methadone maintenance)  [x]  Level I Supportive recovery environment and/or skills to cope  []  Level II.1 Recovery environment not supportive, but with structure and support, can cope  []  Level II.5 Recovery environment not supportive, but with structure and support and relief from the home environment, can cope  []  Level  III.1 Environment is dangerous, but recovery achievable if Level III.1 24-hour structure is available.   []  Level III.3 Environment is dangerous; needs 24-hour structure to learn to cope  []   Level III.5 Environment is dangerous; lacks skills to cope outside of a highly structured 24-hour setting  []  Level III.7 Environment is dangerous; lacks skills to cope outside of a highly structured 24-hour setting  []  Level IV Problems in this dimension do not qualify the patient for Level IV services   DIAGNOSTIC IMPRESSIONS   A.  SYMPTOMATOLOGY   Check all that apply:  []  None  [x]  Increased tolerance  [x]  Frequency in using/drinking  []  Loss of control  []  Preoccupation  [x]  A.M. use []  Blackouts  []  Compulsions  []  Binge uses  []  Attempts to control  []  Protecting/hoarding supply  []  Unusual behavior  []  Crawling skin/goose flesh []  Violence when using  []  Decreased tolerance  []  Neglected responsibilities  []  Financial difficulties  []  Difficulty performing job  [x]  Family and friends concerned  []  Medical consequences []  Seizures  []  Cramps  []  Severe withdrawals  []  Memory problems  []  Undefinable fears  []  Arrested (use)   B.  DIAGNOSTIC CRITERIA FOR DSM-IV DIAGNOSES   At what time in your life did you drink the most?      From age__28______ to age___33_____   At what time in your life did you use other drugs the most?      From age_____x___ to age________   At any time in your life, have you:  CRITERIA  Dependence: At least 2 of the following in a 46-month period:       [x] Taking substances in larger amounts or for              longer than you intended       []  Wanting to cut down or stop using substances but           not able to manage to.   []   Spending a lot of time getting, using and or recovering             from use       [x]  Cravings and Urges to continue using substances       []  Not managing to do what you should at work,    home, or school due to substance use.   []  Continuing to use, even when it causes  problems             in relationships of all types         []  Giving up important social, occupational, or   recreational activities because of substance use.    []  Using substances again and again, even when it puts you in danger.   []  Continued use despite physical or psychological problem known to be related to use   [x]  Tolerance   [x]  Development of Withdrawal symptoms, which can be relieved by taking more of the substance.      CRITERIA   [] Two or three symptoms indicate a mild substance use disorder   [] ? four or five symptoms indicate a moderate substance use disorder   []  six or more symptoms indicate a severe substance use disorder.        DIAGNOSTIC IMPRESSIONS (continued)   C.  DIAGNOSTIC IMPRESSION   DIAGNOSIS(ES) - all that apply   ICD-10 Code: f10.20   ICD-10 Code:     ICD-10 Code:    ICD-10 Code:    ICD-10 Code:    TREATMENT RECOMMENDATIONS   A.  TREATMENT RECOMMENDATIONS   Dimension results:      Dimension 1 - Level: none Dimension  3 - Level: none Dimension 5 - Level: I.0      Dimension 2 - Level: I.0 Dimension 4 - Level: I.0 Dimension 6 - Level: I.0          B.  LEVEL OF CARE RECOMMENDED PER [facility]   []  None   []  Level 0.5 Early intervention   Evidenced by:  ________________________________________________________________________________________________________________  []  Level OMT Opioid maintenance therapy   Evidenced by:       [x]  Level I Outpatient   Evidenced by: patient needs education/insight into addiction/relapse.     []  Level II.1 Intensive outpatient   Evidenced by:     []  Level II.5 Outpatient with partial hospitalization   Evidenced by:     []  Level III.1 Clinically managed low intensity residential services   Evidenced by:     []  Level III.3 Clinically managed medium intensity residential services   Evidenced by:     []  Level III.5 Clinically managed medium/high intensity residential services   Evidenced by:     []  Level III.7 Medically monitored intensive inpatient  services   Evidenced by:     []  Level IV Medically managed intensive inpatient services   Evidenced by:     TREATMENT RECOMMENDATIONS (continued)   C.  OVERRIDES   Are there any circumstances which override placement at the recommended level of care (e.g., legal mandates, logistical barriers to treatment, lack of intensive inpatient, recent treatment failure or need for extended assessment, inpatient aversion therapy, etc.)?  []  Yes      [x]  No            If yes, explain:       D.  ASSESSMENT SUMMARY   Patient is a 69 year old white male seeking an assessment related to his alcohol use. Patient reported that he recently drank heavily for several days - a bottle wine and a bottle of whiskey each day. Patient also reported past heavy use of alcohol. When he quit drinking a few weeks ago he had alcohol w/d symptoms - nausea, shaking. He may have also had a seizure years ago. Patient appears physically healthy and is receiving concurrent medical monitoring by [facility]. Patient reported no mental health symptoms. Patient's goal is to get rid of his cravings for alcohol. Patient meets criteria for alcohol use disorder. He is recommended to enter and complete treatment at [facility] level I.0.      REFERRING PROVIDER INFORMATION     AGENCY NAME AGENCY SITE/PROGRAM NAME   [facility]   CONTACT PERSON PHONE NUMBER   [name] [place], [place] [phone]   EMAIL ADDRESS FAX NUMBER   [email or web address] [phone]     Amedeo Gory.A. CDP

## 2020-01-10 ENCOUNTER — Telehealth (INDEPENDENT_AMBULATORY_CARE_PROVIDER_SITE_OTHER): Payer: Self-pay | Admitting: Podiatrist

## 2020-01-10 NOTE — Telephone Encounter (Signed)
Patient called in and would like to discuss getting some new orthotics    Please call

## 2020-01-11 ENCOUNTER — Encounter (HOSPITAL_BASED_OUTPATIENT_CLINIC_OR_DEPARTMENT_OTHER): Payer: Medicare PPO | Admitting: Anesthesiology

## 2020-01-11 ENCOUNTER — Ambulatory Visit: Payer: Medicare PPO | Attending: Anesthesiology | Admitting: Anesthesiology

## 2020-01-11 DIAGNOSIS — G894 Chronic pain syndrome: Secondary | ICD-10-CM | POA: Insufficient documentation

## 2020-01-11 DIAGNOSIS — G8929 Other chronic pain: Secondary | ICD-10-CM | POA: Insufficient documentation

## 2020-01-11 DIAGNOSIS — M545 Low back pain, unspecified: Secondary | ICD-10-CM

## 2020-01-11 DIAGNOSIS — M792 Neuralgia and neuritis, unspecified: Secondary | ICD-10-CM | POA: Insufficient documentation

## 2020-01-11 DIAGNOSIS — M47816 Spondylosis without myelopathy or radiculopathy, lumbar region: Secondary | ICD-10-CM | POA: Insufficient documentation

## 2020-01-11 DIAGNOSIS — M7918 Myalgia, other site: Secondary | ICD-10-CM | POA: Insufficient documentation

## 2020-01-11 NOTE — Telephone Encounter (Signed)
L/m for pt to call me back

## 2020-01-11 NOTE — Progress Notes (Signed)
TELEPHONE VISIT    Context of the visit  Patient was unable to get Telehealth to work properly from home so encounter changed to Telephone. He was accompanied by his wife [name].    CHIEF COMPLAINT:   Chief Complaint   Patient presents with    Pain     myofascial pain       HISTORY OF PRESENT ILLNESS:  The purpose of today's visit is interval reevaluation of back pain. The patient has a pain history significant for bilateral lumbar facet arthropathy with prior positive response to RFA, but less response to more recent diagnostic injections. We had recently been doing a series of trigger point injections though the last was deferred due to a rib injury he had sustained several weeks prior with worsening bruising. He was seen in urgent care for xrays and no rib fractures were identified.    On 12/29/19 the patient sent a message to his PCP regarding desiring referring for Alcohol Treatment program. He does have a history of alcohol use in the past.    He still complains of left sided back pain up and down the whole spine. He has stopped using a cane. Overall he is walking better than has         IMPRESSION:   Ronnie Mason is a 69 year old male with   1.Axial low back painsecondary tofacet arthropathy at L4-L5,L5-S1 with prior positive response to RFA(last in 2017) as well as lumbar facet arthropathy. More recent diagnostic medial branch blocks have not been helpful so do not plan to proceed to RFA again.   2. R leg/foot pain laterally in settingmallet toe and peroneal brevis and longus tear diagnosed by podiatry-now s/pR foot 1 MTP arthrodesis 6/11/20with challenges with wound healing and recent ulcer  3. Prior treatment failures including:Short duration of benefit forinterlaminar lumbarepidural steroids and TFESI, memantine  4. Multiple prior injections by outside providers(exluding MBB and trigger point injections):   Dr. Hinda Kehr  ? 03/23/2018 -LEFT L5 TFESI with 80-85%relief for about 3-4 weeksof  just axial back pain.  ? 09/15/2017 -Bilateral L5TFESI with 50-75% reliefof mixture of low back and leg pain.  ? 05/19/2017 -RIGHT L5 TFESIwith 100% relief ofRadicularsymptoms for about 3 months  ? 08/12/2016 - L5-S1 ILESI  ? 07/01/2016 - Ultrasound guided right trochanteric bursa injection   ? 05/20/2016 - Bilateral shoulder injections  ? 05/13/2016 - Right L3 L4 L5/S1 RFA   ? 02/19/2016 - Left L3, L4, L5/S1 RFA   ? 12/27/2012 - L5-S1 IL ESI  ? 08/31/2012 - Left L5 TFESI  ? 03/30/2012 - Left L4/5, L5/S1 facet inj  ? 02/10/2012 - Left C6 C7 MB RFA  ? 07/03/2011 - Left L3, L4, L5/S1 RFA  ? 05/12/2011 - Left C4, C5, C6 RFA  5. 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    I applauded his reaching out to his PCP for assistance with referral for alcohol use. It sounds as though naltrexone may be an option for him. I am very supportive of this as I had considered low dose naltrexone for his pain in the past. Full dose  naltrexone could potentially have some overlap in benefit.    I encouraged the patient to continue with PT.    I will see him for follow up in 10-12 weeks and we will consider repeat trigger points or simplification of him medication regimen depending on how he is doing.    I spent a total of 15 minutes on the phone with the patient.

## 2020-01-15 ENCOUNTER — Ambulatory Visit: Payer: Medicare PPO | Attending: Addiction (Substance Use Disorder) | Admitting: Addiction (Substance Use Disorder)

## 2020-01-15 ENCOUNTER — Telehealth (INDEPENDENT_AMBULATORY_CARE_PROVIDER_SITE_OTHER): Payer: Self-pay | Admitting: Internal Medicine

## 2020-01-15 DIAGNOSIS — Z91030 Bee allergy status: Secondary | ICD-10-CM

## 2020-01-15 DIAGNOSIS — F1020 Alcohol dependence, uncomplicated: Secondary | ICD-10-CM | POA: Insufficient documentation

## 2020-01-15 NOTE — Telephone Encounter (Signed)
LV-07/13/19  NV- no future appointments scheduled

## 2020-01-15 NOTE — Telephone Encounter (Signed)
Out of medication    Medication: epi pen   Pharmacy: [facility] [place]   Is this a new pharmacy? No   Is this a new medication?  No   Quantity 4 (comes in two pack)

## 2020-01-15 NOTE — Telephone Encounter (Signed)
Pt is scheduled for Friday with Roe Coombs to discuss getting Quick Strides w/Mod for his new shoes.

## 2020-01-15 NOTE — Progress Notes (Signed)
[facility]  1:1 Session Note    Problem #: 1,2,3,4   Time: 30 Minutes  Notes: D) Patient reported that his sobriety date is 12/27/19 and that he hasnt been to any meetings in the past week.  Patient reported that his wife spent less time in [place] than originally planned -  Patient said that he talked to his family about how isolation/loneliness are a trigger for his drinking. Patient stated that he is sleeping well, eating well. He reported no cravings - last week he talked about how he had constant cravings. -Patient has an appointment for possibly getting on Naltrexone soon. Patient appears to be doing well A) Patient appeared open and honest during this session. P) Attend all appointments.              [name] M.A. CDP

## 2020-01-16 MED ORDER — EPINEPHRINE 0.3 MG/0.3ML IJ SOAJ
INTRAMUSCULAR | 1 refills | Status: DC
Start: 2020-01-16 — End: 2021-04-17

## 2020-01-19 ENCOUNTER — Ambulatory Visit (INDEPENDENT_AMBULATORY_CARE_PROVIDER_SITE_OTHER): Payer: Medicare PPO

## 2020-01-19 VITALS — Temp 98.2°F

## 2020-01-19 DIAGNOSIS — L97501 Non-pressure chronic ulcer of other part of unspecified foot limited to breakdown of skin: Secondary | ICD-10-CM

## 2020-01-19 DIAGNOSIS — S90421A Blister (nonthermal), right great toe, initial encounter: Secondary | ICD-10-CM

## 2020-01-19 NOTE — Progress Notes (Signed)
Ronnie Mason is here today to have 2 pair of quick strides fit and modified to his new shoes.  Please see [name] notes on this patient.  [name] has a new pair of [employer] balance shoes.  They are a size 45 quick stride that what is needed.  I fabricated 2 pair of these with 1/8 in Spenco top covers on left and right inserts x4.  I then added 1/8 in Korex to the right insert this was done to the forefoot of the right insert.  I accommodated the hallux distal tip area where he is having his issues.  This is to match up to what he is using in his shoe currently.  I spent approximately 15 min in the modification of these 2 pair of inserts.  He will try them and will return back on an as-needed basis with [name].

## 2020-01-23 ENCOUNTER — Ambulatory Visit (HOSPITAL_BASED_OUTPATIENT_CLINIC_OR_DEPARTMENT_OTHER): Payer: Medicare PPO | Admitting: Addiction (Substance Use Disorder)

## 2020-01-23 ENCOUNTER — Ambulatory Visit: Payer: Medicare PPO | Attending: Psychiatry | Admitting: Psychiatry

## 2020-01-23 DIAGNOSIS — F01A0 Vascular dementia, mild, without behavioral disturbance, psychotic disturbance, mood disturbance, and anxiety: Secondary | ICD-10-CM

## 2020-01-23 DIAGNOSIS — F1020 Alcohol dependence, uncomplicated: Secondary | ICD-10-CM

## 2020-01-23 DIAGNOSIS — F0150 Vascular dementia without behavioral disturbance: Secondary | ICD-10-CM | POA: Insufficient documentation

## 2020-01-23 DIAGNOSIS — G894 Chronic pain syndrome: Secondary | ICD-10-CM | POA: Insufficient documentation

## 2020-01-23 MED ORDER — NALTREXONE HCL 50 MG OR TABS
50.0000 mg | ORAL_TABLET | Freq: Every day | ORAL | 5 refills | Status: AC
Start: 2020-01-23 — End: 2020-02-22

## 2020-01-23 NOTE — Progress Notes (Signed)
[facility] ADDICTION CLINIC NOTE    DATE: 01/23/20  TYPE OF SERVICE: I spent 60 minutes of face-to-face time with the patient (and his wife, whom he invited into evaluation), the majority (>50%) of which was in evaluation, medical management, and counseling (focused on treatment engagement, addictions and psychiatric symptomatology and treatment options, enhancing coping strategies) as outlined below.   LOCATION OF SERVICE: In-clinic Visit     ID: Ronnie Mason is a 69 year old male who is here today for an Addiction Psychiatry appointment.     PROBLEM LIST:  Patient Active Problem List    Diagnosis Date Noted    Syncope     Hypotension     Carotid Sinus Hypersensitivity     URI (upper respiratory infection)     Myofascial pain 10/20/2019    MRSA (methicillin resistant staph aureus) culture positive 07/13/2019    Alcoholism in remission ([place]) 03/16/2019     03/16/2019        Closed fracture of one rib of right side 01/26/2019     See x ray 01/26/2019   Right anterior lat sub acute chronic       Enuresis 09/06/2018    Chronic left-sided low back pain without sciatica 06/21/2018    Acute left-sided low back pain without sciatica 03/14/2018    Greater trochanteric pain syndrome of right lower extremity 11/25/2017    Complex tear of medial meniscus of left knee as current injury 09/08/2017    Primary osteoarthritis of left knee 09/08/2017    Right lumbar radiculitis 05/05/2017    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    Primary osteoarthritis of right hip 01/11/2017     Osteoarthritis of right hip x ray report - moderate to advanced radiology read 01/11/2017      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    Impaired mobility and ADLs 04/08/2016    Difficulty in walking, not elsewhere classified 03/12/2016    Vitamin D deficiency 02/13/2016    Balance problem 01/23/2016    Chronic pain of both shoulders 01/23/2016     X ray arthritis  02/19/2016        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) 01/15/2016    Intraparenchymal hemorrhage of brain ([place]) 01/06/2016    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    Essential hypertension 12/16/2015    Essential tremor 12/16/2015    Hx of spontan intraparenchymal intracran bleed assoc with hypertension 12/16/2015     [place] - 4/29- 12/10/2015  Non-traumatic intraparenchymal hemorrhage, hypertensive of the left thalamus   Traumatic hemorrhage   -- falcine subdural hemorrhage, small cortical subarachnoid      Alcohol abuse 12/16/2015    Cerebral ventriculomegaly 10/09/2015    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    History of alcohol dependence ([place]) 06/26/2015    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 ([place]) 05/07/2015    Idiopathic peripheral neuropathy 03/15/2015    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 myelopathy or radiculopathy 05/28/2014    Sensory neuropathy 01/18/2014    Tremor 04/20/2013    Fall 02/23/2013     BP < 100  Occasioanal tri[p and leg weakness      Back pain, lumbosacral 02/15/2013    Lumbar radiculopathy, chronic 02/15/2013    Neck pain 02/15/2013    Chronic pain 12/21/2012     Now at [facility], DO, Robbie Lis    Neck pain -burn c3-7 on left side per patient  Some trigger injection    Lumbar pain-since 80's better  with exercise      Bee sting allergy 12/21/2012     hives      Numbness and tingling of leg 12/21/2012     Left calf -left foot long term suspect from back-suspect L 45      Chronic back pain 12/21/2012     Mri in mid scape 1/214  Multilevel degenerative disc disease of the lumbar spine. Circumferential disc bulge at all levels below and including L1-2. No significant central spinal canal stenosis or neuroforaminal narrowing.  2. Mild retrolisthesis of L5 on S1.            B12 deficiency 12/21/2012     Borderline low 5 /21/2014      Chronic renal insufficiency, stage III (moderate) 12/21/2012     5/ 2013      Hyperlipidemia 09/26/2012     Cervicalgia 03/11/2010     Radiofrequency ablation of c3 to c 7          CHIEF COMPLAINT: AUD     HISTORY OF PRESENT ILLNESS:    Pt presents to clinic for evaluation and treatment planning related to substance use disorders and general mental/behavioral health. Arrives on time and in NAD, with wife (whom he indicates he would like to have in room during assessment), w/o report or evidence of acute psychiatric or other medical decompensation. Pt reports that he began drinking around 8th grade, progressed in frequency and quantity of through college but was not function-impairing; drink wine throughout her adult life (usually with dinner, socialization), however several years ago this progressed to a fifth of burbon/day several years ago (patient states that when opioids for pain were rapidly tapered, he "turned to alcohol" to manage pain and has since lost control over use), which, following a significant fall and CNS injury, he switch to wine, drinking approximately 2 bottles/day about a year ago, with more recent efforts to cut down again (in the context of support/encouragement from family), leading to a 1 week period of sobriety in May 2021, with subsequent return to drinking of 2 glasses with dinner.  Patient indicates that is been "the extent of it" though his wife indicates that she has been finding bottles of wine/bourbon/whiskey/vodka from time to time in and around the house as recently as this past month (the patient at times seems surprised are unaware of this, though also indicating that earlier in the spring when his wife was gone for family matters he would walk to the store by alcohol regularly.).  He states he is not until recently undergone any formal treatment involvement commonly either psychosocial or pharmacologic.  He states he has never struggled with withdrawal although couple weeks back he did note an increase in his essential tremor and some nausea that may have been associated with  cessation of drinking while he was in the [facility].  He denies any more recent withdrawal symptoms.    Based on his report, Ronnie Mason' alcohol use characterized by:   - [ x ] Using larger amounts than intended  - [ x ] Persistent desire and/or unsuccessful efforts to cut down/control use  - [  ] Significant time spent on obtaining, using, and/or recovering from use   - [ x ] Craving/strong desire/urge to use   - [  ] Recurrent use resulting in failure to fulfill major role obligations at school/work/home   - [ x ] Continued use despite persistent/recurrent social/interpersonal problems caused or exacerbated by substance's effects   - [  ]  Important social/occupational/recreational activities are given up/reduced d/t use   - [ x ] Recurrent use in physically hazardous situations   - [ x ] Recurrent use despite knowledge of persistent/recurrent physical/psychological problem likely caused by or exacerbated by use      - [ x ] Tolerance, defined as:  - [ x ] Withdrawal (symptoms or substance taken to avoid withdrawal symptoms)    _0  2-3 criteria, mild UD  _1  4-5 criteria, moderate UD  [x ] 6 or more criteria, severe UD    Psychiatric: Denies any significant history of psychiatric symptoms (depression, anxiety, mania/hypomania, PD, delusional thought content, ADHD); did suffer IPH several years ago with apparent neurocognitive deficits thereafter.    Somatic/General Medical:   -chronic pain and numbness in bilat lower extremities (no other pain in shoulders, hips, neck), cared for by Dr. Lisbeth Ply at the [facility]; history of multiple GLFs (some of which are within the last month), sometimes in the context of drinking though he cites LE numbness and poor balance is being significant contributing factors.  Denies new or significant headaches, dizziness, vision changes, S OB, CP, abdominal pain or other GI distress, recent changes in urination, new numbness/tingling/paresthesias (states normal sensation in his  upper extremities is preserved)    Psychosocial: Stably housed with wife, has supportive children; retired without significant financial concerns.    Discussed indications, relative risks & benefits, and alternatives to treatment options as well as relevant aspects of pharmacology, the induction process, and reviewed key elements of and expectation regarding the addictions program. Pt was given opportunities to ask and have questions answered and agrees with the treatment plan outlined below.      SUBSTANCE USE HISTORY:       -- Opioid use history: Denies      -- Alcohol: began drinking 8th grade, progressed in frequency and quantity of through college but was not function-impairing; drink wine throughout her adult life (usually with dinner, socialization), however several years ago this progressed to a fifth of burbon/day several years ago (patient states that when opioids for pain were rapidly tapered, he "turned to alcohol" to manage pain and has since lost control over use), which, following a significant fall and CNS injury, he switch to wine, drinking approximately 2 bottles/day about a year ago, with more recent efforts to cut down again (in the context of support/encouragement from family), leading to a 1 week period of sobriety in May 2021, with subsequent return to drinking of 2 glasses with dinner.  Patient indicates that is been "the extent of it" though his wife indicates that she has been finding bottles of wine/bourbon/whiskey/vodka from time to time in and around the house as recently as this past month (the patient at times seems surprised are unaware of this, though also indicating that earlier in the spring when his wife was gone for family matters he would walk to the store by alcohol regularly.).  He states he is not until recently undergone any formal treatment involvement commonly either psychosocial or pharmacologic.  He states he has never struggled with withdrawal although couple weeks back  he did note an increase in his essential tremor and some nausea that may have been associated with cessation of drinking while he was in the [facility].      -- Sedative-Hypnotic: Denies      -- Stimulants: Denies      -- MDMA: denies      -- Inhalants: denies       --  Hallicinogens: denies      -- PCP: denies      -- Cannabis/cannabinoids: Denies      -- Tobacco: Denies    PSYCHIATRIC HISTORY:  -Diagnoses: neurocognitive disorder (likely vascular etiology)  -Hospitalizations: Denies  -ITAs: Denies  -Suicide Attempts: Denies  -Outpt Treatment Hx: Denies  -Previous Psychiatric Meds: Memantine  -Current Psychiatric Meds: Duloxetine (for pain), pregabalin (for pain)    PAST MEDICAL HISTORY:  -Chronic shoulder/hip/back/neck pain, lumbar radiculopathy and lower extremity peripheral neuropathy, hyperlipidemia, HTN, 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, history of GLFs, essential tremor, and history of vitamin D and B12 deficiencies      SOCIAL HISTORY:  Living situation: House independently but wife  Marital status: Married, adult children  Social supports: family  Education: Gaffer  Employment history: Arboriculturist, now retired  Scientist, research (physical sciences): No issues reported    FAMILY HISTORY:  SUDS: severe AUD in both mat/pat lines  Psych: brother with depression who died by suicide    MEDICATIONS:  Outpatient Medications Prior to Visit   Medication Sig Dispense Refill    Acetaminophen 500 MG Oral Tab 2 tablets bid      Alpha-Lipoic Acid 300 MG tablet Take 1 tablet by mouth daily.      amLODIPine 5 MG tablet Take 1 tablet (5 mg) by mouth 2 times a day. 90 tablet 3    amoxicillin-clavulanate (Augmentin) 500-125 MG tablet Take 1 tablet (500 mg) by mouth every 12 hours. 21 tablet 0    atorvastatin 10 MG Oral Tablet Take 1 tablet (10 mg) by mouth daily. 90 tablet 2    BENFOTIAMINE OR Take 250 mg by mouth daily.      Cholecalciferol (VITAMIN D3) 2000 units Oral Cap Take 1 capsule (2,000 Units) by mouth daily. For  low level 1 capsule 1    Cyanocobalamin 1000 MCG Oral Tab one per day over-the-counter started 5/21 /2012 this level borderline low 1 Tab 1    diclofenac 1 % gel Apply 4 g topically 4 times a day. Apply to 2 gram four times daily to neck, trapezius muscle, and low back. Use a max of 32 grams a day. 300 g 4    diclofenac 1 % gel       DULoxetine 30 MG DR capsule TAKE ONE CAPSULE BY MOUTH ONCE DAILY 90 capsule 0    DULoxetine 60 MG DR capsule TAKE ONE CAPSULE BY MOUTH ONCE DAILY 90 capsule 0    EPINEPHrine 0.3 MG/0.3ML auto-injector Inject as instructed per patient package insert, 0.3 mg intramuscularly or subcutaneously into the thigh, if needed to treat anaphylaxis 4 each 1    lidocaine 5 % patch Apply 1 patch onto the skin daily. Apply to painful area for up to 12 hours in a 24 hour period. 30 patch 10    lisinopril 20 MG tablet Take 1 tablet (20 mg) by mouth every 12 hours. 180 tablet 3    naloxone (Narcan) 4 MG/0.1ML nasal spray Use 1 spray in one nostril for suspected opioid overdose. Call 911. If unresponsive in 2 to 3 minutes, repeat with new naloxone nasal spray. 2 each 0    pregabalin 100 MG capsule TAKE ONE CAPSULE BY MOUTH THREE TIMES A DAY 270 capsule 1    pregabalin 50 MG capsule TAKE ONE CAPSULE BY MOUTH THREE TIMES A DAY 270 capsule 1    primidone 50 MG tablet TAKE ONE TABLET BY MOUTH TWICE DAILY 180 tablet 1    solifenacin 5 MG tablet  TAKE ONE TABLET BY MOUTH ONCE DAILY 90 tablet 0    thiamine 100 MG tablet Take 1 tablet by mouth daily.       triamcinolone 0.1 % cream As needed to rear bid x 1 week 15 g 1     No facility-administered medications prior to visit.        ALLERGIES:  Review of patient's allergies indicates:  Allergies   Allergen Reactions    Adhesives Skin: Rash    Bee Venom Skin: Hives, Skin: Itching and Swelling    Gabapentin      Flu like symptoms, diarrhea, body ache upset stomach    Methocarbamol Other     Patient's wife reports cognitive difficulties ("goofy")      MEDICAL REVIEW OF SYMPTOMS:  Other than in the HPI, all other systems are negative.    PHYSICAL EXAM:  There were no vitals taken for this visit.    -GENERAL: in no acute distress, non-diaphoretic   -HEENT: sclera/conjunctiva clear, PERRLA, no significant bruising or excoriations (in the setting of history of falls)  -RESPIRATORY: non-labored breathing  -EXTREMITIES: No visible deformities, edema (albeit limited physical exam)  -SKIN : No skin abscesses, lesions, rashes, ulcers on exposed skin  -NEUROLOGICAL: Bilateral UE tremor (with reported chronic essential tremor), antalgic and slightly wider set gait.      MENTAL STATUS EXAM:  General appearance: appropriately groomed and casually but neatly dressed  Behavior/activity: appropriate and reserved but cooperative  Speech: generally normal rate and prosody  Affect: constricted  Mood: euthymic  Thought Form: circumstantial  Thought Content: No SI/HI and No AH or VH  Orientation: person, place and situation  Attention/concentration: attentive  Memory: impaired  Insight and judgment: some insight  Additional comments/details:     LAB RESULTS:  Results for orders placed or performed in visit on 07/13/19   Comprehensive Metabolic Panel   Result Value Ref Range    Sodium 137 135 - 145 meq/L    Potassium 4.3 3.6 - 5.2 meq/L    Chloride 101 98 - 108 meq/L    Carbon Dioxide, Total 29 22 - 32 meq/L    Anion Gap 7 4 - 12    Glucose 92 62 - 125 mg/dL    Urea Nitrogen 30 (H) 8 - 21 mg/dL    Creatinine 1.12 0.51 - 1.18 mg/dL    Protein (Total) 7.6 6.0 - 8.2 g/dL    Albumin 4.9 3.5 - 5.2 g/dL    Bilirubin (Total) 0.6 0.2 - 1.3 mg/dL    Calcium 9.8 8.9 - 10.2 mg/dL    AST (GOT) 35 9 - 38 U/L    Alkaline Phosphatase (Total) 95 36 - 161 U/L    ALT (GPT) 33 10 - 48 U/L    eGFR, Calculated >60 >59 mL/min/[1.73_m2]    GFR, Information       Calculated GFR by CKD-EPI equation. Inaccurate with changing renal function. See [email or web address].   CBC  with Differential   Result Value Ref Range    WBC 8.01 4.3 - 10.0 10*3/uL    RBC 4.93 4.40 - 5.60 10*6/uL    Hemoglobin 16.4 13.0 - 18.0 g/dL    Hematocrit 50 38 - 50 %    MCV 101 (H) 81 - 98 fL    MCH 33.3 27.3 - 33.6 pg    MCHC 32.8 32.2 - 36.5 g/dL    Platelet Count 233 150 - 400 10*3/uL    RDW-CV 14.6 (H) 11.6 - 14.4 %    %  Neutrophils 67 %    % Lymphocytes 19 %    % Monocytes 11 %    % Eosinophils 2 %    % Basophils 1 %    % Immature Granulocytes 0 %    Neutrophils 5.43 1.80 - 7.00 10*3/uL    Absolute Lymphocyte Count 1.52 1.00 - 4.80 10*3/uL    Monocytes 0.85 (H) 0.00 - 0.80 10*3/uL    Absolute Eosinophil Count 0.12 0.00 - 0.50 10*3/uL    Basophils 0.06 0.00 - 0.20 10*3/uL    Immature Granulocytes 0.03 0.00 - 0.05 10*3/uL    Nucleated RBC 0.00 0.00 10*3/uL    % Nucleated RBC 0 %   Lipid Panel   Result Value Ref Range    Cholesterol (Total) 309 (H) <200 mg/dL    Triglyceride 263 (H) <150 mg/dL    Cholesterol (HDL) 114 >39 mg/dL    Cholesterol (LDL) 142 (H) <130 mg/dL    Non-HDL Cholesterol 195 (H) 0 - 159 mg/dL    Cholesterol/HDL Ratio 2.7     Lipid Panel, Additional Info. (NOTE)        IMAGING/EKG/OTHER STUDIES REVIEWED:  EKG 12-LEAD  Order: [id number]  Status:  Final result Visible to patient:  Yes (MyChart) Next appt:  04/04/2020 at 10:00 AM in Pain Management Art Buff, MD)   Component   Ref Range & Units 02/09/19 1556    Ventricular Rate   BPM 87     Atrial Rate   BPM 87     P-R Interval   ms 162     QRS Duration   ms 70     Q-T Interval   ms 368     QTC Calculation   ms 442     P Axis   degrees 15     R Axis   degrees 31     T Axis   degrees 32     Diagnosis  Normal sinus rhythm   Normal ECG   When compared with ECG of 12-Oct-2017 10:58,   No significant change was found   Confirmed by DODGE M.D., STEPHEN [phone]) on 02/10/2019 11:29:37 AM          Specimen Collected: 02/09/19 15:56 Last Resulted: 02/10/19 11:29               PRESCRIPTION MONITORING PROGRAM RESULTS:  Medication Dispense History  (from 01/23/2019 to 01/23/2020)     Dispensed Written Strength Quantity Refills Days Supply Provider Pharmacy   PREGABALIN 50 MG CAPSULE 12/21/2019 12/14/2019  270 unspecified  90 KRAFT, MD[facility][id number]   PREGABALIN 100 MG CAPSULE 12/20/2019 12/14/2019  270 unspecified  90 KRAFT, [facility][id number]   PREGABALIN 100 MG CAPSULE 11/06/2019 05/23/2019  90 unspecified  30 KRAFT, MD[id number]   PREGABALIN 100 MG CAPSULE 09/30/2019 05/23/2019  90 unspecified  30 KRAFT, [facility] #[id number]   PREGABALIN 100 MG CAPSULE 09/30/2019 05/23/2019  90 unspecified  30 KRAFT, MD[facility] #[id number]   PREGABALIN 50 MG CAPSULE 09/30/2019 05/23/2019  180 unspecified  60 KRAFT, MD[facility][id number]   PREGABALIN 50 MG CAPSULE 09/30/2019 05/23/2019  180 unspecified  60 KRAFT, MD[id number]   PREGABALIN 100 MG CAPSULE 08/26/2019 05/23/2019  90 unspecified  30 KRAFT, [facility]   PREGABALIN 100 MG CAPSULE 08/26/2019 05/23/2019  90 unspecified  30 KRAFT, MD[facility]   PREGABALIN 50 MG CAPSULE 07/20/2019 05/23/2019  180 unspecified  60 KRAFT, MD[facility]   PREGABALIN 50 MG CAPSULE 07/20/2019 05/23/2019  180 unspecified  60 KRAFT, MD[facility]   PREGABALIN 100 MG CAPSULE 03/16/2019 03/16/2019  180 unspecified  90 KRAFT, MD[facility]   PREGABALIN 100 MG CAPSULE 03/16/2019 03/16/2019  180 unspecified  90 KRAFT, MD[facility]   PREGABALIN 50 MG CAPSULE 03/16/2019 03/16/2019  249 unspecified  90 KRAFT, MD[facility]   PREGABALIN 50 MG CAPSULE 03/16/2019 03/16/2019  249 unspecified  90 KRAFT, MD[facility]   OXYCODONE HCL 5 MG TABLET 02/13/2019 02/13/2019  10 unspecified  5 BLAHOUS, JR, DPM[facility]   OXYCODONE HCL 5 MG TABLET 02/13/2019 02/13/2019  10 unspecified  5 BLAHOUS, JR, DPM[facility]   OXYCODONE HCL 5 MG TABLET  02/08/2019 02/08/2019  20 unspecified  5 KIM, DPM,[facility]   OXYCODONE HCL 5 MG TABLET 02/08/2019 02/08/2019  20 unspecified  5 KIM, DPM[facility]   OXYCODONE HCL 5 MG TABLET 02/03/2019 02/03/2019  20 unspecified  5 KIM, DPM,[facility]   OXYCODONE HCL 5 MG TABLET 02/03/2019 02/03/2019  20 unspecified  5 KIM, DPM,[facility]   OXYCODONE HCL 5 MG TABLET 01/24/2019 01/24/2019  20 unspecified  5 KIM, DPM[facility]   OXYCODONE HCL 5 MG TABLET 01/24/2019 01/24/2019  20 unspecified  5 KIM, DPM[id number]            SUICIDE SAFETY ASSESSMENT:  Suicide Ideation Risk Category: Low: No SI, or, only passive death wishes    Risk factors include: substance use disorder    Suicide Ideation protective factors: hope for future, responsibility to others, well engaged in treatment, high spirituality or belief that suicide is immoral, protective social network and willingness to follow crisis plan    OVERALL SUICIDE ASSESSMENT:     At present, pt is estimated to be at low acute risk for harm to self or others (though pts baseline risk, relative to the general population, is increased) based on the balance of the following risk/protective factors: age, gender, race/ethnicity, sexual orientation, degree of social isolation/support, current psychiatric symptom-burden, SU hx, hx of SI/self-harm, current access to means, estimated personality structure, relative severity of comorbid medical issues/pain, other acute on chronic psychosocial stressors, engagement in treatment. Pt should, however, be monitored for significant changes in contextual and intra-personal factors that could enhance risk (e.g., major losses, worsening psychiatric symptom burden, relapse/escalating SU.)    ASSESSMENT:     Pt is a married, house, retired 69 year old male with history of AUD, neurocognitive disorder with prior CVA, chronic pain and other significant medical  history here for an Addiction Psychiatry appointment.  Based on available data, pt meets criteria for the SUDs, general psychiatric, and other medical conditions listed below (under diagnoses).  This includes meeting criteria for alcohol use disorder, severe.time spent with patient and wife discussing diagnosis, etiology, potential  treatment options (psychosocial and pharmacologic).  Both were given opportunities to ask and have all questions answered. The treatment plan, below, was developed in a collaborative manner.  Mr. Manigo is, at this time, interested in controlled drinking with assistance of naltrexone; patient is already on a gabapentinoid (pregabalin), which may be synergistic with naltrexone for alcohol cessation/controlled use. Patient was instructed on proper administration of medications, including the potential side effects and  verbalized understanding of the information provided.  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, history of GLFs, essential tremor, and history of vitamin D and B12 deficiencies    PLAN:     ## Alcohol Use DO:  -No indication of need for withdrawal management (and based on history provided in terms of prior withdrawal experience and current use, would appear to be at low risk for future severe withdrawal in the setting of cessation)  - thiamine 137m qday (while alcohol use continues)  - c/w pregabalin 100 mg TID  - begin naltrexone 50 mg/day  - Psychosocial (usually the more effective intervention, larger effect size than MAT for AUD):  encourage participation in psychotherapy for SUDs w/Ronnie Mason, further involvement in IOP, AA/NA; residential tx may be an option if outpt treatment is ineffective.  - Pt to present to ED/hospital with emergence of any significant w/d sxs, confusion, seizure or hallucinations    # OTHER:  -Patient to continue care with other providers (including pain, PCP)  -Continue to work on reduction/medication of fall risk  -Consider recheck of B12, vitamin D given history of deficiencies (about current repletion)    # SAFETY:  - no indication of acute medical concern or acute risk of harm to self or others  - patient instructed to present to clinic/MH provider, the local Emergency Department, dial 911, and/or call the crisis line (1-866-4CRISIS, or [phone] should mental health status worsen or require immediate attention.     # FOLLOW-UP:  -RTC with this provider in 4 weeks for follow-up.  -Pt to f/u with his addiction clinic team and groups, per pt's established program.  -Pt to f/u with primary care, psychiatric services, other specialty medical care, as needed.

## 2020-01-23 NOTE — Progress Notes (Signed)
[facility] Addictions Program  1:1 Session Note    Problem #: 1,2,3,4   Time: 30 Minutes  Notes: D) Patient met with the psychiatrist prior to this appt. Patient was prescribed 8m of Naltrexone. Patient stated that he is planning to drink 1-2 glasses of wine regularly. He's hopeful that the medication/behavioral changes will help him to stay away from vodka and bourbon. We discussed not isolating and talking things over with his family. Patient had been sober for a while until last weekend - when his daughter brought him a bottle of wine for Father's day. Patient said that he will be [place]. A) Patient appeared open and honest during this session. P) Attend all appointments. Take medication as prescribed.              MTenny CrawM.A. CDP

## 2020-01-24 ENCOUNTER — Encounter (HOSPITAL_BASED_OUTPATIENT_CLINIC_OR_DEPARTMENT_OTHER): Payer: Self-pay | Admitting: Anesthesiology

## 2020-01-24 ENCOUNTER — Encounter (INDEPENDENT_AMBULATORY_CARE_PROVIDER_SITE_OTHER): Payer: Self-pay | Admitting: Internal Medicine

## 2020-01-24 DIAGNOSIS — M542 Cervicalgia: Secondary | ICD-10-CM

## 2020-01-24 DIAGNOSIS — G894 Chronic pain syndrome: Secondary | ICD-10-CM

## 2020-01-25 MED ORDER — DULOXETINE HCL 60 MG OR CPEP
60.0000 mg | DELAYED_RELEASE_CAPSULE | Freq: Every day | ORAL | 0 refills | Status: DC
Start: 2020-01-25 — End: 2020-02-09

## 2020-01-25 MED ORDER — DULOXETINE HCL 30 MG OR CPEP
30.0000 mg | DELAYED_RELEASE_CAPSULE | Freq: Every day | ORAL | 0 refills | Status: DC
Start: 2020-01-25 — End: 2020-02-09

## 2020-01-25 NOTE — Telephone Encounter (Signed)
DULoxetine 30 MG DR capsule  Last visit: 07/13/2019  Last CPE: 09/17/17  Next visit: Visit date not found  Last refill: 11/07/2019  Last Prescribed by: Judi Saa, MD    DULoxetine 60 MG DR capsule  Last visit: 07/13/2019  Last CPE: 09/17/17  Next visit: Visit date not found  Last refill: 11/07/2019  Last Prescribed by: Judi Saa, MD

## 2020-01-25 NOTE — Telephone Encounter (Signed)
Per VK, please schedule pt for a medication f/u after VK moves to [place]. Thank you!

## 2020-01-25 NOTE — Telephone Encounter (Signed)
Please schedule next available with me  At [facility] due

## 2020-01-25 NOTE — Telephone Encounter (Signed)
Left message

## 2020-01-31 ENCOUNTER — Telehealth (INDEPENDENT_AMBULATORY_CARE_PROVIDER_SITE_OTHER): Payer: Self-pay | Admitting: Internal Medicine

## 2020-01-31 DIAGNOSIS — M542 Cervicalgia: Secondary | ICD-10-CM

## 2020-01-31 DIAGNOSIS — G894 Chronic pain syndrome: Secondary | ICD-10-CM

## 2020-01-31 NOTE — Telephone Encounter (Signed)
Called pt per his request.  Pt staes he's having trouble getting through to his PCP's, Dr. Joette Catching new office as their phone number is not working well.  Advised pt to send an ecare message requesting med refill and to let them know their phone system is having issues as they may not be aware.  He agreed to plan.    Pt also requested a sooner f/u appt as his pain in his low back to hip and down right leg is bothering him more.  Pt now has appt on 02/09/20.  Pt states appreciation for appt and has no further questions.

## 2020-01-31 NOTE — Telephone Encounter (Signed)
RETURN CALL: Voicemail - Detailed Message      SUBJECT:  Medication Questions     NAME OF MEDICATION(S): DULoxetine 60 MG DR capsule  CONCERNS/QUESTIONS: increase dosage  ADDITIONAL INFORMATION: Patient called to say Dr Roselle Locus told him to increase to twice a day instead of once and he wants to make sure that's okay with pcp and to increase quantity. Please follow up with patient. Thank you

## 2020-01-31 NOTE — Telephone Encounter (Signed)
Per review of chart:     RXs for 30 mg and 60 mg were sent for #90 each on 06/24, each with directions to take one daily    Per review of visit w/ Dr. Araceli Bouche on 11/22/19:    PLAN:     Recommend continuing pregabalin150 mg TID  Increase duloxetineto 60 mg qam and 60 mg pm  Continue acetaminophenprn.    RX pended at requested 60 mg BID dosage, routing to PCP to review increased dosage request

## 2020-01-31 NOTE — Telephone Encounter (Signed)
Please ask him for appointment with me   Need to be seen best in clinic

## 2020-02-06 NOTE — Telephone Encounter (Signed)
Attempted to call patient but his mailbox is full. Sent a MyChart message to patient.

## 2020-02-08 NOTE — Progress Notes (Signed)
CHIEF COMPLAINT:   Chief Complaint   Patient presents with   . Follow-Up    Back Mason    HISTORY OF PRESENT ILLNESS:  The purpose of today's visit is interval reevaluation of back Mason. The patient has a Mason history significant for bilateral lumbar facet arthropathy with prior positive response to RFA, but less response to more recent diagnostic injections as well as myofascial back Mason. The patient was last seen by Dr. Araceli Bouche on 01/11/20 at which time I applauded him in his efforts to get treatment for alcohol use and supported naltrexone. I also recommended increasing duloxetine to 60 mg BID. He does not believe this has been useful.    Over the past 3 weeks he has noted new groin Mason which radiates in the bilateral lateral legs. Mason in groin is 7/10 in severity when he moves. His lowe back Mason is 7/10 in his lower back. Mason is significantly interfering with his his ADLs. Denies urinary or fecal symptoms. Continues to have neuropathic symptoms in feet. He is sitting/sleeping in a recliner frequently.    He is taking ibuprofen 800mg  daily and tylenol 3gm daily which helps some.He will be starting PT next week due to scheduling delay.    As written by the medical student :  "The Mason is localized at low back and recently the groin. Since last consultation, the low back Mason has been gradually worsening radiating down on the lateral R side to his feet and lateral L side above his knee and he developed new groin Mason approximately 3 weeks ago. He has fallen twice since last telemedicine visit. Patient endorses difficulty going up and down stairs due to new groin Mason, low back Mason, and hip tightness.  Ronnie Mason is reporting back Mason as a 7/10 , groin Mason is a 7/10 with movement but 0/10 at rest level of Mason, on a 0-10 Numeric Mason Rating Scale. The Mason interferes substantially with daily activities, currently immobilized in his recliner. Ronnie Mason.  Denies urinary and fecal incontinence."      REVIEW OF SYSTEMS:  The review of systems documented by our clinic staff was provided by the patient. I reviewed and confirmed these details with Ronnie Mason and have no further detail to add.    PHYSICAL EXAMINATION:  BP (!) 145/80   Pulse 79   Temp 36.1 C (Temporal)   Ht 6\' 4"  (1.93 m) Comment: per patient  Wt (!) 109 kg (240 lb 6.4 oz)   SpO2 97%   BMI 29.26 kg/m    Constitutional:Constitutional: well-appearing, well-groomed and cooperative   Skin, exposed parts: color normal, temperature normal  Eyes: Negative for miosis bilaterally. Pupils appropriate for room lighting. Extraocular movements are intact bilaterally  Neurologic: Gait slow and antalgic. Strength 5/5 in bilateral lower extremities in proximal and distal muscle groups on left. Strength 4+/5-/5 in distal muscle groups in right. Strength 5/5 in right proximal muscles on left. Reduced sensation to light touch and pinprick throughout lateral right leg and left lower leg. Patella reflex 1+ on right and unable to be elicited on left.  Musculoskeletal: Lumbar ROM exam deferred due to expected Mason. Refuses extension. Able to rise from seated to standing position without some pushoff. Mason to palpation of bilateral. paraspinous muscles with significant hypertonicity. Mason to palpation of lumbar midline spine. Left calf appears small compared to right. Straight leg raise produces Mason on right hip.  Psychiatric: Judgement/insight are good. Mood is  good. Affect is appropriate. Patient is oriented to person, place and time    IMPRESSION:   Ronnie Mason is a 69 year old male with   1.Axial low back painsecondary tofacet arthropathy at L4-L5,L5-S1 with prior positive response to RFA(last in 2017) as well as lumbar facet arthropathy. More recent diagnostic medial branch blocks have not been helpful so do not plan to proceed to RFA again.  Significant ongoing myofascial component with Mason radiating from back  to hips.  2. 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 Deconditioning  4. R leg/foot Mason laterally in settingmallet toe and peroneal brevis and longus tear diagnosed by podiatry-now s/pR foot 1 MTP arthrodesis 6/11/20with challenges with wound healingand ulcer, now resolved   5. Prior treatment failures including:Short duration of benefit forinterlaminar lumbarepidural steroids and TFESI, memantine  6. Multiple prior injections by outside providers(exluding MBB and trigger point injections):   Dr. [name]  ? 03/23/2018 -LEFT L5 TFESI with 80-85%relief for about 3-4 weeksof just axial back Mason.  ? 09/15/2017 -Bilateral L5TFESI with 50-75% reliefof mixture of low back and leg Mason.  ? 05/19/2017 -RIGHT L5 TFESIwith 100% relief ofRadicularsymptoms for about 3 months  ? 08/12/2016 - L5-S1 ILESI  ? 07/01/2016 - Ultrasound guided right trochanteric bursa injection   ? 05/20/2016 - Bilateral shoulder injections  ? 05/13/2016 - Right L3 L4 L5/S1 RFA   ? 02/19/2016 - Left L3, L4, L5/S1 RFA   ? 12/27/2012 - L5-S1 IL ESI  ? 08/31/2012 - Left L5 TFESI  ? 03/30/2012 - Left L4/5, L5/S1 facet inj  ? 02/10/2012 - Left C6 C7 MB RFA  ? 07/03/2011 - Left L3, L4, L5/S1 RFA  ? 05/12/2011 - Left C4, C5, C6 RFA  7. 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    Subsequently will consider series of trigger point injections to lower  back under ultrasound to include quadratus lumborum.    Will consider EMG/NCV of left lower extremity given calf atrophy depending on progress with PT.    Highly encouraged PT which will start next week.    I again encouraged him with his treatment for AUD.    I spent a total of 40 minutes for the patient's care on the date of the service.    I saw and evaluated the patient with the medical student , who conducted the initial history.  I reviewed and confirmed the history in detail with Mr. Kreuser in person. We performed the physical together.      Lelon Perla, MD  Assistant Professor  [facility]

## 2020-02-08 NOTE — Telephone Encounter (Signed)
Pt returned call. Scheduled appt for tomorrow. Nothing further needed; closing encounter.

## 2020-02-09 ENCOUNTER — Ambulatory Visit (INDEPENDENT_AMBULATORY_CARE_PROVIDER_SITE_OTHER): Payer: Medicare PPO | Admitting: Internal Medicine

## 2020-02-09 ENCOUNTER — Ambulatory Visit: Payer: Medicare PPO | Attending: Anesthesiology | Admitting: Anesthesiology

## 2020-02-09 ENCOUNTER — Encounter (INDEPENDENT_AMBULATORY_CARE_PROVIDER_SITE_OTHER): Payer: Self-pay | Admitting: Internal Medicine

## 2020-02-09 VITALS — BP 145/80 | HR 79 | Temp 97.0°F | Ht 76.0 in | Wt 240.4 lb

## 2020-02-09 VITALS — BP 130/80 | HR 90 | Temp 96.0°F | Wt 235.0 lb

## 2020-02-09 DIAGNOSIS — E785 Hyperlipidemia, unspecified: Secondary | ICD-10-CM

## 2020-02-09 DIAGNOSIS — M5410 Radiculopathy, site unspecified: Secondary | ICD-10-CM | POA: Insufficient documentation

## 2020-02-09 DIAGNOSIS — M545 Low back pain, unspecified: Secondary | ICD-10-CM

## 2020-02-09 DIAGNOSIS — M542 Cervicalgia: Secondary | ICD-10-CM

## 2020-02-09 DIAGNOSIS — G894 Chronic pain syndrome: Secondary | ICD-10-CM

## 2020-02-09 DIAGNOSIS — M7918 Myalgia, other site: Secondary | ICD-10-CM | POA: Insufficient documentation

## 2020-02-09 DIAGNOSIS — M48061 Spinal stenosis, lumbar region without neurogenic claudication: Secondary | ICD-10-CM | POA: Insufficient documentation

## 2020-02-09 DIAGNOSIS — G8929 Other chronic pain: Secondary | ICD-10-CM | POA: Insufficient documentation

## 2020-02-09 DIAGNOSIS — I10 Essential (primary) hypertension: Secondary | ICD-10-CM

## 2020-02-09 DIAGNOSIS — M47816 Spondylosis without myelopathy or radiculopathy, lumbar region: Secondary | ICD-10-CM | POA: Insufficient documentation

## 2020-02-09 MED ORDER — DULOXETINE HCL 60 MG OR CPEP
60.0000 mg | DELAYED_RELEASE_CAPSULE | Freq: Two times a day (BID) | ORAL | 1 refills | Status: DC
Start: 2020-02-09 — End: 2020-06-08

## 2020-02-09 NOTE — Progress Notes (Signed)
Ronnie Mason is a 69 year old male       BP 130/80   Pulse 90   Temp 35.6 C   Wt (!) 106.6 kg (235 lb)   SpO2 95%   BMI 28.61 kg/m   Chief Complaint   Patient presents with   . Medication Management     review and change of dosage       HPI  History of:    pcp Donnal Moat, MD    H/o alcohol , chronic pain , back - foot past surgery  b12 def on replacement level ok 07/2019         Patient Care Team:  Donnal Moat, MD as PCP - General (Internal Medicine)  Donnal Moat, MD (Internal Medicine)  Cleotis Nipper, MD as Cardiologist  (Cardiology)  Dennison Bulla, MD (Sports Medicine)  Fabiola Backer, DPM (Podiatry)  Temple Pacini, MD (Orthopedic Surgery)  Lavell Islam, MD (Gastroenterology)  Gerrianne Scale, MD (General Surgery)  Gayleen Orem, [place] (Urology)  Wilmon Arms Dong-Hyun, DPM (Podiatry)       Today follow-up above issues:      Sore pain in pack and legs all the time     Dr Ronita Hipps will be doing more epidural and maybe ttrigger point  Back groin and hip   Sore in right leg=- all on that side sciatica     Right side still pain toe repair  Many operations on the foot   But foot does not bother him    Last wk   2-3 days could not get out of char as muscle in back groin and hips   Had had to get out cane     No F   No chills     In rehab outpatient for alcohol   Now on naltrexone started 1-2 wks ago   1-2 glasses of wine per night   His goal is to not be off 100%  Seeing addiction clinic   Working w Sheran Lawless and Dr Blair Promise- shih - last visit 6/22     He would like for me to increase the duloxetine   curently 60 am and 30 pm   Has been on 60 bid for few weeks ago   He thinks not helping  Taking tylenol or ibuprofen  If forget can feel the next day     Has grumpy and good day   Today is ok     pregabalin is now 150 bid - no side effect    bp at home has been 135/80         Office Visit on 07/13/19   1. Comprehensive Metabolic Panel   Result  Value Ref Range    Sodium 137 135 - 145 meq/L    Potassium 4.3 3.6 - 5.2 meq/L    Chloride 101 98 - 108 meq/L    Carbon Dioxide, Total 29 22 - 32 meq/L    Anion Gap 7 4 - 12    Glucose 92 62 - 125 mg/dL    Urea Nitrogen 30 (H) 8 - 21 mg/dL    Creatinine 1.12 0.51 - 1.18 mg/dL    Protein (Total) 7.6 6.0 - 8.2 g/dL    Albumin 4.9 3.5 - 5.2 g/dL    Bilirubin (Total) 0.6 0.2 - 1.3 mg/dL    Calcium 9.8 8.9 - 10.2 mg/dL    AST (GOT) 35 9 - 38 U/L  Alkaline Phosphatase (Total) 95 36 - 161 U/L    ALT (GPT) 33 10 - 48 U/L    eGFR, Calculated >60 >59 mL/min/[1.73_m2]    GFR, Information       Calculated GFR by CKD-EPI equation. Inaccurate with changing renal function. See [email or web address].[email or web address].   2. CBC with Differential   Result Value Ref Range    WBC 8.01 4.3 - 10.0 10*3/uL    RBC 4.93 4.40 - 5.60 10*6/uL    Hemoglobin 16.4 13.0 - 18.0 g/dL    Hematocrit 50 38 - 50 %    MCV 101 (H) 81 - 98 fL    MCH 33.3 27.3 - 33.6 pg    MCHC 32.8 32.2 - 36.5 g/dL    Platelet Count 233 150 - 400 10*3/uL    RDW-CV 14.6 (H) 11.6 - 14.4 %    % Neutrophils 67 %    % Lymphocytes 19 %    % Monocytes 11 %    % Eosinophils 2 %    % Basophils 1 %    % Immature Granulocytes 0 %    Neutrophils 5.43 1.80 - 7.00 10*3/uL    Absolute Lymphocyte Count 1.52 1.00 - 4.80 10*3/uL    Monocytes 0.85 (H) 0.00 - 0.80 10*3/uL    Absolute Eosinophil Count 0.12 0.00 - 0.50 10*3/uL    Basophils 0.06 0.00 - 0.20 10*3/uL    Immature Granulocytes 0.03 0.00 - 0.05 10*3/uL    Nucleated RBC 0.00 0.00 10*3/uL    % Nucleated RBC 0 %   3. Lipid Panel   Result Value Ref Range    Cholesterol (Total) 309 (H) <200 mg/dL    Triglyceride 263 (H) <150 mg/dL    Cholesterol (HDL) 114 >39 mg/dL    Cholesterol (LDL) 142 (H) <130 mg/dL    Non-HDL Cholesterol 195 (H) 0 - 159 mg/dL    Cholesterol/HDL Ratio 2.7     Lipid Panel, Additional Info. (NOTE)        No results found.    MEDICATION PRIOR TO VISIT  Reports   Outpatient Medications Prior to Visit    Medication Sig Dispense Refill   . Acetaminophen 500 MG Oral Tab 2 tablets bid     . Alpha-Lipoic Acid 300 MG tablet Take 1 tablet by mouth daily.     [name] amLODIPine 5 MG tablet Take 1 tablet (5 mg) by mouth 2 times a day. 90 tablet 3   . amoxicillin-clavulanate (Augmentin) 500-125 MG tablet Take 1 tablet (500 mg) by mouth every 12 hours. 21 tablet 0   . atorvastatin 10 MG Oral Tablet Take 1 tablet (10 mg) by mouth daily. 90 tablet 2   . BENFOTIAMINE OR Take 250 mg by mouth daily.     . Cholecalciferol (VITAMIN D3) 2000 units Oral Cap Take 1 capsule (2,000 Units) by mouth daily. For low level 1 capsule 1   . Cyanocobalamin 1000 MCG Oral Tab one per day over-the-counter started 5/21 /2012 this level borderline low 1 Tab 1   . diclofenac 1 % gel Apply 4 g topically 4 times a day. Apply to 2 gram four times daily to neck, trapezius muscle, and low back. Use a max of 32 grams a day. 300 g 4   . diclofenac 1 % gel      . DULoxetine 30 MG DR capsule Take 1 capsule (30 mg) by mouth daily. 90 capsule 0   . DULoxetine 60 MG DR capsule Take 1 capsule (60 mg) by  mouth daily. (Patient taking differently: Take 120 mg by mouth daily. ) 90 capsule 0   . EPINEPHrine 0.3 MG/0.3ML auto-injector Inject as instructed per patient package insert, 0.3 mg intramuscularly or subcutaneously into the thigh, if needed to treat anaphylaxis 4 each 1   . lidocaine 5 % patch Apply 1 patch onto the skin daily. Apply to painful area for up to 12 hours in a 24 hour period. 30 patch 10   . lisinopril 20 MG tablet Take 1 tablet (20 mg) by mouth every 12 hours. 180 tablet 3   . naloxone (Narcan) 4 MG/0.1ML nasal spray Use 1 spray in one nostril for suspected opioid overdose. Call 911. If unresponsive in 2 to 3 minutes, repeat with new naloxone nasal spray. 2 each 0   . naltrexone 50 MG tablet Take 1 tablet (50 mg) by mouth daily. 30 tablet 5   . pregabalin 100 MG capsule TAKE ONE CAPSULE BY MOUTH THREE TIMES A DAY 270 capsule 1   . pregabalin 50 MG  capsule TAKE ONE CAPSULE BY MOUTH THREE TIMES A DAY 270 capsule 1   . primidone 50 MG tablet TAKE ONE TABLET BY MOUTH TWICE DAILY 180 tablet 1   . solifenacin 5 MG tablet TAKE ONE TABLET BY MOUTH ONCE DAILY 90 tablet 0   . thiamine 100 MG tablet Take 1 tablet by mouth daily.      [name] triamcinolone 0.1 % cream As needed to rear bid x 1 week 15 g 1     No facility-administered medications prior to visit.        REVIEW OF SYSTEMS -   Not reviewed if not marked   P=Positive  N=Negative      P   N    _0  _1  Constitutional:   _2  _3  Eyes:    _4  _5  Head ears, nose mouth throat:  _6  _7  Cardiovascular:    _8  _9  Respiratory:     _10  _11  Gastrointestinal:     _12  _13  Genitourinary:      _14  _15  Musculoskeletal:     Above   _16  _17  Integumentary:     _18  _19  Neurological:     _20  _21  Psychiatric/Behavioral:      PHQ2 Total Score: 0      _22  _23  Endocrine:     _24  _25  Hematological:     _26  _27  Allergic/Immunologic:      _28  _29  Breast:   _30  _31  Male: GU /testicular:         PHYSICAL Exam    BP 130/80   Pulse 90   Temp 35.6 C   Wt (!) 106.6 kg (235 lb)   SpO2 95%   BMI 28.61 kg/m     Physical Exam    In nad  Foot socks slight baseline puffy right ankles  Legs are thin     Heart rrr no m  Lungs ct     In nad  Pleasant     IMPRESSION / PLAN / DISCUSSION   (I10) Essential hypertension  (primary encounter diagnosis)  (G89.4) Chronic pain syndrome  (M54.2) Cervicalgia  (E78.5) Hyperlipidemia, unspecified hyperlipidemia type    Anguel was seen today for medication management.    Diagnoses and all orders for this visit:    Essential hypertension  -     Comprehensive Metabolic Panel; Future  -     CBC with Diff; Future    Chronic pain syndrome  -     DULoxetine 60 MG DR capsule;  Take 1 capsule (60 mg) by mouth 2 times a day.    Cervicalgia  -     DULoxetine 60 MG DR capsule; Take 1 capsule (60 mg) by mouth 2 times a day.    Hyperlipidemia, unspecified hyperlipidemia type  -     Lipid Panel; Future      bp controlled   No changes needed  Labs due on  lipid    Lipid went up in Dec   Need recheck     On going pain seeing pain clinic  Has been and continues on gabapentin  Has inc the duloxetine to 60 bid - this is max  Body hurts all the time    Lipid issue     Not clear why ldl jumped  On atrovastatin still   checklabs       Discussed hm due along with other issues above   Health Maintenance Topics with due status: Overdue       Topic Date Due    COVID-19 Vaccine complete Never done    Zoster Vaccine will get [facility] at [facility]  07/17/2013     Health Maintenance Topics with due status: Due Soon       Topic Date Due    Lipid Disorders Screening 07/12/2020         No follow-ups on file.    Patient   express understanding of care plan/medications  Risk of taking medication properly and follow up discussed especially to call if problem        See me medicare well ness

## 2020-02-09 NOTE — Progress Notes (Signed)
Review of Systems   Constitutional: Negative.    HENT: Negative.    Eyes: Negative.    Respiratory: Negative.    Cardiovascular: Negative.    Gastrointestinal: Negative.    Endocrine: Negative.    Genitourinary: Negative.    Musculoskeletal: Negative.    Skin: Negative.    Allergic/Immunologic: Negative.    Neurological: Negative.    Hematological: Negative.    Psychiatric/Behavioral: Negative.

## 2020-02-09 NOTE — Patient Instructions (Signed)
We will call you to schedule lumbar epidural steroid injection    We will plan to do trigger point injections under ultrasound in the future

## 2020-02-10 ENCOUNTER — Other Ambulatory Visit (INDEPENDENT_AMBULATORY_CARE_PROVIDER_SITE_OTHER): Payer: Medicare PPO

## 2020-02-10 DIAGNOSIS — I10 Essential (primary) hypertension: Secondary | ICD-10-CM

## 2020-02-10 DIAGNOSIS — E785 Hyperlipidemia, unspecified: Secondary | ICD-10-CM

## 2020-02-10 LAB — CBC, DIFF
% Basophils: 1 %
% Eosinophils: 3 %
% Immature Granulocytes: 0 %
% Lymphocytes: 23 %
% Monocytes: 13 %
% Neutrophils: 60 %
% Nucleated RBC: 0 %
Absolute Eosinophil Count: 0.16 10*3/uL (ref 0.00–0.50)
Absolute Lymphocyte Count: 1.32 10*3/uL (ref 1.00–4.80)
Basophils: 0.08 10*3/uL (ref 0.00–0.20)
Hematocrit: 47 % (ref 38–50)
Hemoglobin: 15.8 g/dL (ref 13.0–18.0)
Immature Granulocytes: 0.02 10*3/uL (ref 0.00–0.05)
MCH: 35.5 pg — ABNORMAL HIGH (ref 27.3–33.6)
MCHC: 33.3 g/dL (ref 32.2–36.5)
MCV: 107 fL — ABNORMAL HIGH (ref 81–98)
Monocytes: 0.74 10*3/uL (ref 0.00–0.80)
Neutrophils: 3.45 10*3/uL (ref 1.80–7.00)
Nucleated RBC: 0 10*3/uL
Platelet Count: 239 10*3/uL (ref 150–400)
RBC: 4.45 10*6/uL (ref 4.40–5.60)
RDW-CV: 13.2 % (ref 11.6–14.4)
WBC: 5.77 10*3/uL (ref 4.3–10.0)

## 2020-02-10 LAB — LIPID PANEL
Cholesterol (LDL): 123 mg/dL (ref <130)
Cholesterol/HDL Ratio: 2.6
HDL Cholesterol: 100 mg/dL (ref >39)
Non-HDL Cholesterol: 164 mg/dL — ABNORMAL HIGH (ref 0–159)
Total Cholesterol: 264 mg/dL — ABNORMAL HIGH (ref <200)
Triglyceride: 206 mg/dL — ABNORMAL HIGH (ref <150)

## 2020-02-10 LAB — COMPREHENSIVE METABOLIC PANEL
ALT (GPT): 19 U/L (ref 10–48)
AST (GOT): 20 U/L (ref 9–38)
Albumin: 4.4 g/dL (ref 3.5–5.2)
Alkaline Phosphatase (Total): 87 U/L (ref 36–161)
Anion Gap: 9 (ref 4–12)
Bilirubin (Total): 0.5 mg/dL (ref 0.2–1.3)
Calcium: 9.7 mg/dL (ref 8.9–10.2)
Carbon Dioxide, Total: 31 meq/L (ref 22–32)
Chloride: 101 meq/L (ref 98–108)
Creatinine: 0.93 mg/dL (ref 0.51–1.18)
Glucose: 104 mg/dL (ref 62–125)
Potassium: 4.8 meq/L (ref 3.6–5.2)
Protein (Total): 7.1 g/dL (ref 6.0–8.2)
Sodium: 141 meq/L (ref 135–145)
Urea Nitrogen: 20 mg/dL (ref 8–21)
eGFR by CKD-EPI: >60 mL/min/{1.73_m2} (ref >59)

## 2020-02-11 ENCOUNTER — Other Ambulatory Visit (INDEPENDENT_AMBULATORY_CARE_PROVIDER_SITE_OTHER): Payer: Self-pay | Admitting: Internal Medicine

## 2020-02-11 ENCOUNTER — Encounter (INDEPENDENT_AMBULATORY_CARE_PROVIDER_SITE_OTHER): Payer: Self-pay | Admitting: Internal Medicine

## 2020-02-11 DIAGNOSIS — D7589 Other specified diseases of blood and blood-forming organs: Secondary | ICD-10-CM

## 2020-02-12 ENCOUNTER — Encounter (INDEPENDENT_AMBULATORY_CARE_PROVIDER_SITE_OTHER): Payer: Self-pay | Admitting: Internal Medicine

## 2020-02-12 ENCOUNTER — Telehealth (INDEPENDENT_AMBULATORY_CARE_PROVIDER_SITE_OTHER): Payer: Self-pay | Admitting: Internal Medicine

## 2020-02-12 DIAGNOSIS — D7589 Other specified diseases of blood and blood-forming organs: Secondary | ICD-10-CM | POA: Insufficient documentation

## 2020-02-12 LAB — LAB ADD ON ORDER

## 2020-02-12 NOTE — Telephone Encounter (Signed)
Please ask him for repeat in 2-4 wks

## 2020-02-12 NOTE — Telephone Encounter (Signed)
[facility] called in regards to the lab add on from Dr. Marisa Sprinkles.  They could not process the pathology review or smear from Dr. Marisa Sprinkles due to the stability of the sample.  The CBC w/ diff was collected more than 24 hours upon request.  Will route to Dr. Marisa Sprinkles as an Lorain Childes.  Thank you     -[facility]

## 2020-02-13 NOTE — Telephone Encounter (Signed)
LVM infoming Pt to call back.     Sent MyChart message informing Pt to repeat orders.     Leaving open for Pt response, then okay to close.

## 2020-02-13 NOTE — Telephone Encounter (Signed)
Patient called back and scheduled new lab appointment.    Nothing further needed.  Closing encounter.

## 2020-02-14 ENCOUNTER — Ambulatory Visit: Payer: Medicare PPO | Attending: Addiction (Substance Use Disorder) | Admitting: Addiction (Substance Use Disorder)

## 2020-02-14 ENCOUNTER — Encounter (HOSPITAL_BASED_OUTPATIENT_CLINIC_OR_DEPARTMENT_OTHER): Payer: Self-pay

## 2020-02-14 DIAGNOSIS — F1020 Alcohol dependence, uncomplicated: Secondary | ICD-10-CM | POA: Insufficient documentation

## 2020-02-14 NOTE — Progress Notes (Signed)
[facility] Addictions Program  1:1 Session Note    Problem #: 1,2,3,4   Time: 60 Minutes  Notes: D) Patient spent 5 days on [place] with his wife over the 4th weekend. Patient reported that he is taking his meds. He said that he has been drinking 1.5 glasses of wine with dinner and that is it. This is about 1/2 of what he used to drink with dinner. Patient and I discussed the pros and cons of drinking - he had said that he would ask his doctor if taking more Naltrexone would result in him drinking even less. At the same time, patient said that he is ok with drinking 1.5 glasses of wine a night. Patient's main trigger for drinking excessively seems to be loneliness - since getting help for his drinking he has had less time away from his wife. Patient appears to be doing well.   A) Patient appeared open and honest during this session. P) Attend all meetings/appointments.              [name] M.A. CDP

## 2020-02-15 NOTE — Progress Notes (Signed)
[facility] Individual Service Plan            Dimension(s):  Dimension 5 - Relapse/Continued Use Potential    Problem Statement(s):  I want to stop my use of drugs and/or alcohol    Goal Statement(s):  I want to be sober    Objectives: Est. Comp. Date: Completion Date:   Impulsivity:Identify and list the negative consequences of impulsivity 07/10/20    Relapse / Continued Use Potential:Verbalize five reasons why it is essential to work on a daily program of recovery to maintain abstinence 07/10/20                               Interventions: Clinical responsibility:   Impulsivity:Help patient make connections between impulsivity, negative consequences, and use Case Manager   Relapse:Have the patient write a chemical use history. Teach the patient the high-risk situations that lead to relapse Case Manager    Case Manager    Case Manager    Case Manager                 Dimension(s):  Dimension 6 - Treatment and Recovery Environment    Problem Statement(s):  I want to stop my use of drugs and/or alcohol    Goal Statement(s):  I want to be sober    Objectives: Est. Comp. Date: Completion Date:   Peer Group:Identify several occasions when peer group negativity led to substance abuse 07/10/20    Peer Group:Attend a minimum of two 12-step meetings per week 07/10/20                               Interventions: Clinical responsibility:   Peer Group:Help the patient see the relationship between their peer group and substance abuse, particularly how the peer group often encouraged the use of alcohol/other drugs Case Manager   Peer Group:Have the patient list instances in which peers encouraged substance abuse Case Manager    Case Manager    Case Manager    Case Manager

## 2020-02-22 ENCOUNTER — Encounter (HOSPITAL_BASED_OUTPATIENT_CLINIC_OR_DEPARTMENT_OTHER): Payer: Medicare PPO | Admitting: Anesthesiology

## 2020-02-22 ENCOUNTER — Ambulatory Visit: Payer: Medicare PPO | Attending: Anesthesiology | Admitting: Anesthesiology

## 2020-02-22 VITALS — BP 129/84 | HR 80 | Temp 98.2°F | Resp 16

## 2020-02-22 DIAGNOSIS — G8929 Other chronic pain: Secondary | ICD-10-CM | POA: Insufficient documentation

## 2020-02-22 DIAGNOSIS — M5410 Radiculopathy, site unspecified: Secondary | ICD-10-CM | POA: Insufficient documentation

## 2020-02-22 MED ORDER — DEXAMETHASONE SOD PHOSPHATE PF 10 MG/ML IJ SOLN
INTRAMUSCULAR | Status: AC
Start: 2020-02-22 — End: ?
  Filled 2020-02-22: qty 1

## 2020-02-22 MED ORDER — DEXAMETHASONE SODIUM PHOSPHATE 10 MG/ML IJ SOLN
10.0000 mg | Freq: Once | INTRAMUSCULAR | Status: AC
Start: 2020-02-22 — End: 2020-02-22
  Administered 2020-02-22: 10 mg

## 2020-02-22 NOTE — Patient Instructions (Signed)
[facility]  Post-Procedure Discharge Instructions      After the procedure:  . Immediate and complete pain relief is rare  . Numbness and/or weakness in the area of your body supplied by the injected nerve; these symptoms should resolve but may last up to several hours  . Some soreness and bruising at the injection site(s)    Activities:  . If you have any weakness or numbness caused by the injection, DO NOT DRIVE or operate machinery and limit other activity until sensation returns to normal.  . You may resume regular exercises/activities as tolerated.  . If you received sedation, DO NOT DRIVE or operate machinery for 24 hours.    Medications:  If you stopped taking any blood thinning medications such as Coumadin or Plavix, you may resume these tomorrow unless specified differently by the prescribing physician.    Site care:  . You may remove the band-aid after 6 hours.  . You may shower today. No swimming, tub baths or hot tubs for 24 hours following your procedure.  . For the first 48 hours, apply ice packs to the injection site for 15-20 minutes hourly as needed for comfort.  Wrap a light towel or cloth around ice packs and heating pad to protect the skin.  . After 48 hours, use a warm heating pad to the injection site for 15-20 minutes hourly as needed for comfort.    If you received steroids today:  . Steroid medications may cause facial flushing, occasional low-grade fevers, hiccups, insomnia, headaches, water retention, increased appetite, increased heart rate, and abdominal cramping or bloating. These side effects occur in only about 5 percent of patients and commonly disappear within one to three days after the injection.  . If you are diabetic check your blood sugar more frequently than usual as you may develop an increase in blood sugar for the next 10-14 days. Contact your diabetes physician if this occurs.    Call us if you develop any of the following symptoms in the next 7  days:  . Fever above 100 degrees F     . Any unusual increase in your level of pain  . Swelling, bleeding, redness, or increased tenderness at the procedure or IV site  . Headache not relieved by Tylenol (if you had an epidural steroid injection)  .    Contact us:  Anytime, 24 hours/day, 7 days/week at [phone]    [facility]  Patient Self-Administered Pain Diary  1 month        The procedure you just had was done in hopes of providing long term pain relief. Depending on the type of procedure you had, you will need to answer the questions below 1 month after your procedure.  Accurate completion and timely reporting of your pain diary(s) enables your Pain MD to review the results and make further treatment recommendations.    1 month after your procedure, write your answers to the 4 questions below and send your pain score diary to us either by:      1)  Sending a picture via [employer]    2)  Faxing to [phone]  3)  Calling the pain score voicemail line at [phone].  Leave the spelling of your first and last name, U#, date of birth and date of procedure and the answers to all of the 4 questions below.             Use this scale to rate your   pain  0 1 2 3 4 5 6 7 8 9 10  No pain                      worst pain        Procedure Date: 02/22/20    1.  Pain score immediately before procedure: __________    2. 1 month after your procedure, do you feel better?  YES/NO    3.  If you answered YES to the above, by what percentage__________% (0-100%) has your pain been relieved.    4.  Pain Score now: _________ (at time of telephone call)    [facility]  Patient Self-Administered Pain Diary  3 months        The procedure you just had was done in hopes of providing long term pain relief. Depending on the type of procedure you had, you will need to answer the questions below 3 months after your procedure.  Accurate completion and timely reporting of your pain diary(s) enables your Pain MD to review  the results and make further treatment recommendations.    3 months after your procedure, write your answers to the 4 questions below and send your pain score diary to us either by:      1)  Sending a picture via [employer]    2)  Faxing to [phone]  3)  Calling the pain score voicemail line at [phone].  Leave the spelling of your first and last name, U#, date of birth and date of procedure and the answers to all of the 4 questions below.             Use this scale to rate your pain  0 1 2 3 4 5 6 7 8 9 10  No pain                      worst pain        Procedure Date: 02/22/20    1.  Pain score immediately before procedure: __________    2. 3 months after your procedure, do you feel better?  YES/NO    3.  If you answered YES to the above, by what percentage__________% (0-100%) has your pain been relieved.    4.  Pain Score now: _________ (at time of telephone call)

## 2020-02-22 NOTE — Progress Notes (Signed)
[name] identified by name and DOB.   He confirmed he is having a Midline L4-L5 Interlaminar  He ambulated to procedure area with a cane   He has not fallen in the last 6 months.  Fall prevention interventions: Patient provided with non-skid stockings  He denies taking any blood thinners.  He denies having a bleeding disorder.  He  Has held his medications per provider instructions.  He denies a history of fainting during medical procedures.   He has not been NPO.  He undressed self without assistance.  Is He receiving a steroid injection today? Yes  If yes, has He received the COVID-19 vaccine (either brand) within the past 2 weeks? No  OR does He plan to receive the COVID-19 vaccine (either brand) within the next 2 weeks? No  I verbally reviewed written discharge instructions and pain diary with him.   Confirmed ride home with Ronnie Mason [phone]     He has a pre-procedure pain score of 9    Pain score prior to procedure:  9/10  Pain score after procedure:  8/10    Patient was in normal sinus rhythm during the entire procedure.  [name] met discharge criteria:  A/OX4/baseline, VSS/returned to baseline, ambulatory with steady gait. Site clean dry and intact.  Written and verbal discharge instructions, including emergency contact phone numbers, reviewed with [name]. Verbal understanding obtained from him.  [name] dressed self without assistance. He was discharged in stable condition, ambulatory with steady gait to home with all belongings and with ride/responsible person.

## 2020-02-22 NOTE — Progress Notes (Signed)
PROCEDURE NOTE       Lumbosacral Interlaminar Epidural Steroid Injection    PROCEDURE  Interlaminar epidural steroid injection L4-L5; side: midline.    PRE-PROCEDURE DIAGNOSIS  (M54.10,  G89.29) Chronic radicular pain of lower extremity  (primary encounter diagnosis)    POST-PROCEDURE DIAGNOSIS  (M54.10,  G89.29) Chronic radicular pain of lower extremity  (primary encounter diagnosis)    INDICATION  Lumbar radicular pain bilaterally     ATTENDING PHYSICIAN  [name], MD    FELLOW / RESIDENT  Janett Billow, MD    SEDATION  No    NPO STATUS  No    INTRAVENOUS LINE  No    OPERATIVE PROCEDURE  Prior to the procedure a final verification was performed.   Mr. Folmar  was brought to the procedure room and placed on the exam table in a comfortable prone position. The sterile field was prepped in a sterile fashion with Chloroprep and sterile drapes were placed.   The target interlaminar space was visualized by anteroposterior fluoroscopic image. Local anesthesia was provided by infiltration of  1% lidocaine. An 18-gauge 3.5 inch Touhy needle was placed below the superior laminar edge. Using fluoroscopic guidance, the needle was advanced into the intralaminar window under multiplanar fluoroscopic control to prevent excessively deep insertion of the needle.  After engagement of the ligamentum flavum, the loss of resistance to saline technique was used to identify the epidural space. Attempted aspiration yielded no blood or cerebrospinal fluid. Subsequently, Omnipaque-240 was injected. Fluoroscopy showed the typical pattern of the epidural space and no vascular uptake. Lateral view confirmed proper epidural spread.  Injectate:  A total of dexamethasone 10 mg (1 ml) and 1 ml of 0.25% bupivacaine were injected into the epidural space. The needle was then withdrawn uneventfully.        POST-PROCEDURE  Mr. Schnorr  was transported to the recovery room for observation. Where he  made an uneventful recovery.  Mr. Legault was  given detailed discharge instructions.     COMPLICATIONS  None.     PLAN  Pain diary. Subsequent care will depend on the results of today's procedure.     Janett Billow, MD  Pain Medicine Fellow

## 2020-02-22 NOTE — Progress Notes (Signed)
I was present for the entire procedure (lumbar epidural steroid injection) which was performed under my direct personal supervision.   I reviewed the documentation of the other providers and concur with Dr. Clarene Reamer findings. I edited the procedure note.      [name], MD  Assistant Professor  Department of Anesthesiology and Pain Medicine  [facility]

## 2020-02-23 ENCOUNTER — Encounter (HOSPITAL_BASED_OUTPATIENT_CLINIC_OR_DEPARTMENT_OTHER): Payer: Self-pay | Admitting: Anesthesiology

## 2020-02-23 NOTE — Telephone Encounter (Signed)
Patient asking if he can start PT after injection/ next Tuesday.  I let him know that it is best to take it easy for 2-3 days after procedure, but PT is ok as tolerated. Message also sent to Dr. Araceli Bouche.

## 2020-02-27 ENCOUNTER — Other Ambulatory Visit (INDEPENDENT_AMBULATORY_CARE_PROVIDER_SITE_OTHER): Payer: Medicare PPO

## 2020-02-27 ENCOUNTER — Ambulatory Visit (HOSPITAL_BASED_OUTPATIENT_CLINIC_OR_DEPARTMENT_OTHER): Payer: Medicare PPO | Admitting: Addiction (Substance Use Disorder)

## 2020-02-27 ENCOUNTER — Ambulatory Visit: Payer: Medicare PPO | Attending: Psychiatry | Admitting: Psychiatry

## 2020-02-27 DIAGNOSIS — D7589 Other specified diseases of blood and blood-forming organs: Secondary | ICD-10-CM

## 2020-02-27 DIAGNOSIS — F0150 Vascular dementia without behavioral disturbance: Secondary | ICD-10-CM | POA: Insufficient documentation

## 2020-02-27 DIAGNOSIS — G894 Chronic pain syndrome: Secondary | ICD-10-CM | POA: Insufficient documentation

## 2020-02-27 DIAGNOSIS — F01A0 Vascular dementia, mild, without behavioral disturbance, psychotic disturbance, mood disturbance, and anxiety: Secondary | ICD-10-CM

## 2020-02-27 DIAGNOSIS — F1020 Alcohol dependence, uncomplicated: Secondary | ICD-10-CM

## 2020-02-27 LAB — CBC, DIFF
% Basophils: 1 %
% Eosinophils: 3 %
% Immature Granulocytes: 0 %
% Lymphocytes: 20 %
% Monocytes: 11 %
% Neutrophils: 65 %
% Nucleated RBC: 0 %
Absolute Eosinophil Count: 0.22 10*3/uL (ref 0.00–0.50)
Absolute Lymphocyte Count: 1.43 10*3/uL (ref 1.00–4.80)
Basophils: 0.05 10*3/uL (ref 0.00–0.20)
Hematocrit: 44 % (ref 38–50)
Hemoglobin: 14.6 g/dL (ref 13.0–18.0)
Immature Granulocytes: 0.02 10*3/uL (ref 0.00–0.05)
MCH: 35.2 pg — ABNORMAL HIGH (ref 27.3–33.6)
MCHC: 33.6 g/dL (ref 32.2–36.5)
MCV: 105 fL — ABNORMAL HIGH (ref 81–98)
Monocytes: 0.76 10*3/uL (ref 0.00–0.80)
Neutrophils: 4.64 10*3/uL (ref 1.80–7.00)
Nucleated RBC: 0 10*3/uL
Platelet Count: 248 10*3/uL (ref 150–400)
RBC: 4.15 10*6/uL — ABNORMAL LOW (ref 4.40–5.60)
RDW-CV: 12.7 % (ref 11.6–14.4)
WBC: 7.12 10*3/uL (ref 4.3–10.0)

## 2020-02-27 MED ORDER — NALTREXONE HCL 50 MG OR TABS
ORAL_TABLET | ORAL | 4 refills | Status: DC
Start: 2020-02-27 — End: 2020-03-01

## 2020-02-27 NOTE — Progress Notes (Signed)
[facility] ADDICTION CLINIC NOTE    DATE: 02/27/20    LOCATION OF SERVICE: In-clinic Visit   Duration  E&M (Mins): 45 minutes   Add on Psychotherapy:   Duration (Mins): 45 minutes  Type of Psychotherapy/Interventions used: Motivational Enhancement Therapy & Harm Reduction Therapy, elements of CBT for SUDs/chronic pain.     ID: Ronnie Mason is a 69 year old male who is here today for an Addiction Psychiatry appointment.     PROBLEM LIST:  Patient Active Problem List    Diagnosis Date Noted   . Syncope    . Hypotension    . Carotid Sinus Hypersensitivity    . URI (upper respiratory infection)    . Macrocytosis without anaemia 02/12/2020   . Myofascial pain 10/20/2019   . MRSA (methicillin resistant staph aureus) culture positive 07/13/2019   . Alcoholism in remission [facility]) 03/16/2019     03/16/2019       . Closed fracture of one rib of right side 01/26/2019     See x ray 01/26/2019   Right anterior lat sub acute chronic      . Enuresis 09/06/2018   . Chronic left-sided low back pain without sciatica 06/21/2018   . Acute left-sided low back pain without sciatica 03/14/2018   . Greater trochanteric pain syndrome of right lower extremity 11/25/2017   . Complex tear of medial meniscus of left knee as current injury 09/08/2017   . Primary osteoarthritis of left knee 09/08/2017   . Right lumbar radiculitis 05/05/2017   . 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 Primary osteoarthritis of right hip 01/11/2017     Osteoarthritis of right hip x ray report - moderate to advanced radiology read 01/11/2017     . 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([place]) 08/06/2016   . Impaired mobility and ADLs 04/08/2016   . Difficulty in walking, not elsewhere classified 03/12/2016   . Vitamin D deficiency 02/13/2016   . Balance problem 01/23/2016   . Chronic pain of both shoulders 01/23/2016     X ray arthritis  02/19/2016       . 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 Intraparenchymal hemorrhage of brain ([place]) 01/06/2016    . 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 Essential hypertension 12/16/2015   . Essential tremor 12/16/2015   . Hx of spontan intraparenchymal intracran bleed assoc with hypertension 12/16/2015     [place] - 4/29- 12/10/2015  Non-traumatic intraparenchymal hemorrhage, hypertensive of the left thalamus   Traumatic hemorrhage   -- falcine subdural hemorrhage, small cortical subarachnoid     . Alcohol abuse 12/16/2015   . Cerebral ventriculomegaly 10/09/2015   . 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 History of alcohol dependence ([place]) 06/26/2015   . 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 ([place]) 05/07/2015   . Idiopathic peripheral neuropathy 03/15/2015   . 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 Sensory neuropathy 01/18/2014   . Tremor 04/20/2013   . Fall 02/23/2013     BP < 100  Occasioanal tri[p and leg weakness     . Back pain, lumbosacral 02/15/2013   . Lumbar radiculopathy, chronic 02/15/2013   . Neck pain 02/15/2013   . Chronic pain 12/21/2012     Now at [place], DO, Robbie Lis    Neck pain -burn c3-7 on left side per patient  Some trigger injection    Lumbar pain-since 80's better with exercise     .  Bee sting allergy 12/21/2012     hives     . Numbness and tingling of leg 12/21/2012     Left calf -left foot long term suspect from back-suspect L 45     . Chronic back pain 12/21/2012     Mri in mid scape 1/214  Multilevel degenerative disc disease of the lumbar spine. Circumferential disc bulge at all levels below and including L1-2. No significant central spinal canal stenosis or neuroforaminal narrowing.  2. Mild retrolisthesis of L5 on S1.           . B12 deficiency 12/21/2012     Borderline low 5 /21/2014     . Chronic renal insufficiency, stage III (moderate) 12/21/2012     5/ 2013     . Hyperlipidemia 09/26/2012   . Cervicalgia 03/11/2010     Radiofrequency ablation of c3 to c 7          CHIEF  COMPLAINT: AUD     INTERVAL HISTORY:    SUD:  -AUD: ~4 glasses wine/night; a few instances of liquor in past month (brief, self-arrested); one beer at lunch this past month. States that while he appreciates risks of continued use, his goal is controlled drinking at this time. Is made aware of NIAAA recommendations (given age and sex, </3drk/24hr, </=7/wk), which he still far exceeds. Discussed strategies for limiting access to use (e.g., only at dinner, having only enough wine in kitchen/dining area to meet couples drinking max goals.) Is concerned he has not noted increased sense of control after beginning NTX; though liquor use has significantly declined, wine use is above intended target; wife is more consistently at home/not traveling with likely helps w/structure and monitoring (though she also likes to drink wine at night); interested further titration of NTX No recent reported falls, no w/d symptoms.     Psychiatric: has denied any significant history of psychiatric symptoms (depression, anxiety, mania/hypomania, PD, delusional thought content, ADHD); did suffer IPH several years ago with apparent neurocognitive deficits thereafter. Duloxetine titrated to 127m/day (pt states no SEs but also no perceived benefit (for pain.))    Somatic/General Medical:   -chronic pain and numbness in bilat lower extremities (no other pain in shoulders, hips, neck), [facility]; history of multiple GLFs (some of which are within the several month), sometimes in the context of drinking though he cites LE numbness and poor balance is being significant contributing factors.  Denies new or significant headaches, dizziness, vision changes, S OB, CP, abdominal pain or other GI distress, recent changes in urination, new numbness/tingling/paresthesias (states normal sensation in his upper extremities is preserved)    Psychosocial: Stably housed with wife, has supportive children; retired without  significant financial concerns.    Discussed indications, relative risks & benefits, and alternatives to treatment options as well as relevant aspects of pharmacology, the induction process, and reviewed key elements of and expectation regarding the addictions program. Pt was given opportunities to ask and have questions answered and agrees with the treatment plan outlined below.    SUBSTANCE USE HISTORY:       -- Opioid use history: Denies      -- Alcohol: began drinking 8th grade, progressed in frequency and quantity of through college but was not function-impairing; drink wine throughout her adult life (usually with dinner, socialization), however several years ago this progressed to a fifth of burbon/day several years ago (patient states that when opioids for pain were rapidly  tapered, he "turned to alcohol" to manage pain and has since lost control over use), which, following a significant fall and CNS injury, he switch to wine, drinking approximately 2 bottles/day about a year ago, with more recent efforts to cut down again (in the context of support/encouragement from family), leading to a 1 week period of sobriety in May 2021, with subsequent return to drinking of 2 glasses with dinner (now, increased up to 1 bottle nightly by 02/2020).  Patient indicates that has been "the extent of it" though his wife indicates that she has been finding bottles of wine/bourbon/whiskey/vodka from time to time in and around the house as recently as 01/2020 (the patient at times seems surprised are unaware of this, though also indicating that earlier in the spring when his wife was gone for family matters he would walk to the store by alcohol regularly.).  He states he is not until recently undergone any formal treatment involvement commonly either psychosocial or pharmacologic until 6/21 (began NTX).  He states he has never struggled with withdrawal although in late spring 2021 back he did note an increase in his essential  tremor and some nausea that may have been associated with cessation of drinking while he was in the [facility].      -- Sedative-Hypnotic: Denies      -- Stimulants: Denies      -- MDMA: denies      -- Inhalants: denies       -- Hallicinogens: denies      -- PCP: denies      -- Cannabis/cannabinoids: Denies      -- Tobacco: Denies    PSYCHIATRIC HISTORY:  -Diagnoses: neurocognitive disorder (likely vascular etiology)  -Hospitalizations: Denies  -ITAs: Denies  -Suicide Attempts: Denies  -Outpt Treatment Hx: Denies  -Previous Psychiatric Meds: Memantine  -Current Psychiatric Meds: Duloxetine 138m/day (for pain), pregabalin (for pain)    PAST MEDICAL HISTORY:  -Chronic shoulder/hip/back/neck pain, lumbar radiculopathy and lower extremity peripheral neuropathy, hyperlipidemia, HTN, 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 Acetaminophen 500 MG Oral Tab 2 tablets bid     . Alpha-Lipoic Acid 300 MG tablet Take 1 tablet by mouth daily.     .Marland KitchenamLODIPine 5 MG tablet Take 1 tablet (5 mg) by mouth 2 times a day. 90 tablet 3   . amoxicillin-clavulanate (Augmentin) 500-125 MG tablet Take 1 tablet (500 mg) by mouth every 12 hours. (Patient not taking: Reported on 02/22/2020) 21 tablet 0   . atorvastatin 10 MG Oral Tablet Take 1 tablet (10 mg) by mouth daily. 90 tablet 2   . BENFOTIAMINE OR Take 250 mg by mouth daily.     . Cholecalciferol (VITAMIN D3) 2000 units Oral Cap Take 1 capsule (2,000 Units) by  mouth daily. For low level 1 capsule 1   . Cyanocobalamin 1000 MCG Oral Tab one per day over-the-counter started 5/21 /2012 this level borderline low 1 Tab 1   . diclofenac  1 % gel Apply 4 g topically 4 times a day. Apply to 2 gram four times daily to neck, trapezius muscle, and low back. Use a max of 32 grams a day. 300 g 4   . diclofenac 1 % gel      . DULoxetine 60 MG DR capsule Take 1 capsule (60 mg) by mouth 2 times a day. 180 capsule 1   . EPINEPHrine 0.3 MG/0.3ML auto-injector Inject as instructed per patient package insert, 0.3 mg intramuscularly or subcutaneously into the thigh, if needed to treat anaphylaxis 4 each 1   . lidocaine 5 % patch Apply 1 patch onto the skin daily. Apply to painful area for up to 12 hours in a 24 hour period. 30 patch 10   . lisinopril 20 MG tablet Take 1 tablet (20 mg) by mouth every 12 hours. 180 tablet 3   . pregabalin 100 MG capsule TAKE ONE CAPSULE BY MOUTH THREE TIMES A DAY 270 capsule 1   . pregabalin 50 MG capsule TAKE ONE CAPSULE BY MOUTH THREE TIMES A DAY 270 capsule 1   . primidone 50 MG tablet TAKE ONE TABLET BY MOUTH TWICE DAILY 180 tablet 1   . solifenacin 5 MG tablet TAKE ONE TABLET BY MOUTH ONCE DAILY (Patient not taking: Reported on 02/22/2020) 90 tablet 0   . thiamine 100 MG tablet Take 1 tablet by mouth daily.      [name] triamcinolone 0.1 % cream As needed to rear bid x 1 week 15 g 1     No facility-administered medications prior to visit.        ALLERGIES:  Review of patient's allergies indicates:  Allergies   Allergen Reactions   . Adhesives Skin: Rash   . Bee Venom Skin: Hives, Skin: Itching and Swelling   . Gabapentin      Flu like symptoms, diarrhea, body ache upset stomach   . Methocarbamol Other     Patient's wife reports cognitive difficulties ("goofy")     MEDICAL REVIEW OF SYMPTOMS:  Other than in the HPI, all other systems are negative.    PHYSICAL EXAM:  There were no vitals taken for this visit.    -GENERAL: in no acute distress, non-diaphoretic    -HEENT: sclera/conjunctiva clear, PERRLA, no significant bruising or excoriations  -RESPIRATORY: non-labored breathing  -EXTREMITIES: No visible deformities, edema (albeit limited physical exam)  -SKIN : No skin abscesses, lesions, rashes, ulcers on exposed skin  -NEUROLOGICAL: Bilateral UE tremor (with reported chronic essential tremor), antalgic and slightly wider set gait.      MENTAL STATUS EXAM:  General appearance: appropriately groomed and casually but neatly dressed  Behavior/activity: appropriate and reserved but cooperative  Speech: generally normal rate and prosody  Affect: constricted  Mood: euthymic  Thought Form: circumstantial  Thought Content: No SI/HI and No AH or VH  Orientation: person, place and situation  Attention/concentration: attentive  Memory: impaired  Insight and judgment: some insight  Additional comments/details:     LAB RESULTS:  Results for orders placed or performed in visit on 02/11/20   Lab Add On Order   Result Value Ref Range    Lab Test Requested pathology review of smear     Specimen Type/Description Blood     Sample To Use Most recent     Test Request Status Reorder requested, stability limit exceeded      Results for STRIDER, VALLANCE (MRN [id number]) as of 02/27/2020 11:42   Ref. Range 02/10/2020 09:12  Sodium Latest Ref Range: 135 - 145 meq/L 141   Potassium Latest Ref Range: 3.6 - 5.2 meq/L 4.8   Chloride Latest Ref Range: 98 - 108 meq/L 101   Carbon Dioxide, Total Latest Ref Range: 22 - 32 meq/L 31   Anion Gap Latest Ref Range: 4 - 12  9   Glucose Latest Ref Range: 62 - 125 mg/dL 104   Urea Nitrogen Latest Ref Range: 8 - 21 mg/dL 20   Creatinine Latest Ref Range: 0.51 - 1.18 mg/dL 0.93   eGFR, Calculated Latest Ref Range: >59 mL/min/1.73_m2 >60   GFR, Information Unknown Calculated GFR by CKD-EPI equation. Inaccurate with changing renal function. See http://depts.was...   Calcium Latest Ref Range: 8.9 - 10.2 mg/dL 9.7   Cholesterol (Total) Latest Ref Range: <200 mg/dL 264  (H)   Triglyceride Latest Ref Range: <150 mg/dL 206 (H)   Cholesterol (LDL) Latest Ref Range: <130 mg/dL 123   Cholesterol (HDL) Latest Ref Range: >39 mg/dL 100   Non-HDL Cholesterol Latest Ref Range: 0 - 159 mg/dL 164 (H)   Cholesterol/HDL Ratio Unknown 2.6   Lipid Panel, Additional Info. Unknown (NOTE)   AST (GOT) Latest Ref Range: 9 - 38 U/L 20   ALT (GPT) Latest Ref Range: 10 - 48 U/L 19   Alkaline Phosphatase (Total) Latest Ref Range: 36 - 161 U/L 87   Bilirubin (Total) Latest Ref Range: 0.2 - 1.3 mg/dL 0.5   Protein (Total) Latest Ref Range: 6.0 - 8.2 g/dL 7.1   Albumin Latest Ref Range: 3.5 - 5.2 g/dL 4.4   WBC Latest Ref Range: 4.3 - 10.0 10*3/uL 5.77   RBC Latest Ref Range: 4.40 - 5.60 10*6/uL 4.45   Hemoglobin Latest Ref Range: 13.0 - 18.0 g/dL 15.8   Hematocrit Latest Ref Range: 38 - 50 % 47   MCV Latest Ref Range: 81 - 98 fL 107 (H)   MCH Latest Ref Range: 27.3 - 33.6 pg 35.5 (H)   MCHC Latest Ref Range: 32.2 - 36.5 g/dL 33.3   Platelet Count Latest Ref Range: 150 - 400 10*3/uL 239   RDW-CV Latest Ref Range: 11.6 - 14.4 % 13.2   % Neutrophils Latest Units: % 60   % Lymphocytes Latest Units: % 23   % Monocytes Latest Units: % 13   % Eosinophils Latest Units: % 3   % Basophils Latest Units: % 1   % Immature Granulocytes Latest Units: % 0   % Nucleated RBC Latest Units: % 0   Neutrophils Latest Ref Range: 1.80 - 7.00 10*3/uL 3.45   Absolute Lymphocyte Count Latest Ref Range: 1.00 - 4.80 10*3/uL 1.32   Monocytes Latest Ref Range: 0.00 - 0.80 10*3/uL 0.74   Absolute Eosinophil Count Latest Ref Range: 0.00 - 0.50 10*3/uL 0.16   Basophils Latest Ref Range: 0.00 - 0.20 10*3/uL 0.08   Immature Granulocytes Latest Ref Range: 0.00 - 0.05 10*3/uL 0.02       IMAGING/EKG/OTHER STUDIES REVIEWED:  EKG 12-LEAD  Order: [id number]  Status:  Final result Visible to patient:  Yes (MyChart) Next appt:  04/04/2020 at 10:00 AM in Pain Management Art Buff, MD)   Component   Ref Range & Units 02/09/19 1556     Ventricular Rate   BPM 87     Atrial Rate   BPM 87     P-R Interval   ms 162     QRS Duration   ms 70     Q-T Interval   ms  368     QTC Calculation   ms 442     P Axis   degrees 15     R Axis   degrees 31     T Axis   degrees 32     Diagnosis  Normal sinus rhythm   Normal ECG   When compared with ECG of 12-Oct-2017 10:58,   No significant change was found   Confirmed by DODGE M.D., STEPHEN [phone]) on 02/10/2019 11:29:37 AM          Specimen Collected: 02/09/19 15:56 Last Resulted: 02/10/19 11:29               PRESCRIPTION MONITORING PROGRAM RESULTS:  Medication Dispense History (from 02/27/2019 to 02/27/2020)     Dispensed Written Strength Quantity Refills Days Supply Provider Pharmacy   PREGABALIN 50 MG CAPSULE 12/21/2019 12/14/2019  270 unspecified  90 KRAFT[facility] #[id number]   PREGABALIN 100 MG CAPSULE 12/20/2019 12/14/2019  270 unspecified  90 [facility] #[id number]   PREGABALIN 100 MG CAPSULE 11/06/2019 05/23/2019  90 unspecified  30 [facility] #[id number]   PREGABALIN 100 MG CAPSULE 09/30/2019 05/23/2019  90 unspecified  30 [facility] #[id number]   PREGABALIN 50 MG CAPSULE 09/30/2019 05/23/2019  180 unspecified  60 [facility]FT[facility] #[id number]   PREGABALIN 100 MG CAPSULE 08/26/2019 05/23/2019  90 unspecified  30 [id number]   PREGABALIN 50 MG CAPSULE 07/20/2019 05/23/2019  180 unspecified  43 [facility]FT, [id number]   PREGABALIN 100 MG CAPSULE 03/16/2019 03/16/2019  180 unspecified  63 [facility]FT[id number]   PREGABALIN 50 MG CAPSULE 03/16/2019 03/16/2019  249 unspecified                SUICIDE SAFETY ASSESSMENT:  Suicide Ideation Risk Category: Low: No SI, or, only passive death wishes    Risk factors include: substance use disorder    Suicide Ideation protective factors: hope for future, responsibility to others, well engaged in treatment, high spirituality or belief that suicide is immoral, protective social  network and willingness to follow crisis plan    OVERALL SUICIDE ASSESSMENT:     At present, pt is estimated to be at low acute risk for harm to self or others (though pt's baseline risk, relative to the general population, is increased) based on the balance of the following risk/protective factors: age, gender, race/ethnicity, sexual orientation, degree of social isolation/support, current psychiatric symptom-burden, SU hx, hx of SI/self-harm, current access to means, estimated personality structure, relative severity of comorbid medical issues/pain, other acute on chronic psychosocial stressors, engagement in treatment. Pt should, however, be monitored for significant changes in contextual and intra-personal factors that could enhance risk (e.g., major losses, worsening psychiatric symptom burden, relapse/escalating SU.)    ASSESSMENT:     Pt is a married, house, retired 69 year old male with history of AUD, neurocognitive disorder with prior CVA, chronic pain and other significant medical history here for an Addiction Psychiatry appointment.  Based on available data, pt meets criteria for the SUDs, general psychiatric, and other medical conditions listed below (under diagnoses).  This includes meeting criteria for alcohol use disorder, severe, Time spent with patient and wife discussing diagnosis, etiology, potential  treatment options (psychosocial and pharmacologic).  Both were given opportunities to ask and have all questions answered. The treatment plan, below, was developed in a collaborative manner.  Ronnie Mason is, at this time, interested  in controlled drinking with assistance of naltrexone and gabapentinoid (pregabalin), which may be a synergistic combination for alcohol cessation/controlled use. In the interval period, he had experienced some success in limiting etoh to 1.5 glasses of wine nightly (his goal), though this is increased lately to 2-4+glassed/night by 02/2020. Discussed behavioral/structual  interventions as well as pharmacotherapy (instructed on proper administration of medications, including the potential side effects) and verbalized understanding of the information provided, opting to titrate NTX further while continuing pregabalin at current dose.  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, history of GLFs, essential tremor, and history of vitamin D and B12 deficiencies    PLAN:     ## Alcohol Use DO:  -No indication of need for withdrawal management (and based on history provided in terms of prior withdrawal experience and current use, would appear to be at low risk for future severe withdrawal in the setting of cessation)  - thiamine 144m qday (while alcohol use continues)  - c/w pregabalin 100 mg TID  - titrate naltrexone 50 --> 781mday  - Psychosocial (usually the more effective intervention, larger effect size than MAT for AUD): encourage participation in psychotherapy for SUDs w/Ronnie Mason, further involvement in IOP, AA/NA; residential tx may be an option if outpt treatment is ineffective.  - Pt to present to ED/hospital with emergence of any significant w/d sxs, confusion, seizure or hallucinations    # OTHER:  -Patient to continue care with other providers (including pain, PCP)  -Continue to work on reduction/mitigation of fall risk  -Consider recheck of B12, vitamin D given history of deficiencies (w/repletion, as indicated)    # SAFETY:  - no indication of acute medical concern or acute risk of harm to self or others  - patient instructed to present to  clinic/MH provider, the local Emergency Department, dial 911, and/or call the crisis line (1-866-4CRISIS, or [phone] mental health status worsen or require immediate attention.     # FOLLOW-UP:  -RTC with this provider in 4 weeks for follow-up.  -Pt to f/u with his addiction clinic team and groups, per pt's established program.  -Pt to f/u with primary care, psychiatric services, other specialty medical care, as needed.

## 2020-02-27 NOTE — Progress Notes (Signed)
[facility] Addictions Program  1:1 Session Note    Problem #: 1,2,3,4   Time: 30 Minutes  Notes: D) Patient is walking with a cane right now . Patient said that things are going well. He just had a meeting with the psychiatrist. His dose of Naltrexone was increased 50%. Patient reported that he has been drinking about 3-4 glasses of wine nightly - no hard alcohol. Patient will be working on tapering down his alcohol intake. He would ultimately like to limit his drinking to 1-2 glasses of wine. We discussed whether or not he can control his drinking. We talked about making plans to taper. We also discussed what it would look like if he failed tapering and whether he will ultimately have to quit drinking entirely. Patient stated that he will be going to [employer] this week with his family. A) Patient appeared open and honest during this session. P) Attend all appointments.              [name] M.A. CDP

## 2020-02-28 ENCOUNTER — Encounter (INDEPENDENT_AMBULATORY_CARE_PROVIDER_SITE_OTHER): Payer: Self-pay | Admitting: Internal Medicine

## 2020-02-28 LAB — PATHOLOGISTS REVIEW

## 2020-03-01 ENCOUNTER — Encounter (INDEPENDENT_AMBULATORY_CARE_PROVIDER_SITE_OTHER): Payer: Self-pay | Admitting: Internal Medicine

## 2020-03-01 ENCOUNTER — Ambulatory Visit (INDEPENDENT_AMBULATORY_CARE_PROVIDER_SITE_OTHER): Payer: Medicare PPO | Admitting: Internal Medicine

## 2020-03-01 VITALS — BP 135/80 | HR 84 | Temp 96.9°F | Resp 14 | Ht 74.0 in | Wt 234.0 lb

## 2020-03-01 DIAGNOSIS — M545 Low back pain, unspecified: Secondary | ICD-10-CM

## 2020-03-01 DIAGNOSIS — F09 Unspecified mental disorder due to known physiological condition: Secondary | ICD-10-CM

## 2020-03-01 DIAGNOSIS — M542 Cervicalgia: Secondary | ICD-10-CM

## 2020-03-01 DIAGNOSIS — D7589 Other specified diseases of blood and blood-forming organs: Secondary | ICD-10-CM

## 2020-03-01 DIAGNOSIS — G3189 Other specified degenerative diseases of nervous system: Secondary | ICD-10-CM

## 2020-03-01 DIAGNOSIS — Z0000 Encounter for general adult medical examination without abnormal findings: Secondary | ICD-10-CM

## 2020-03-01 DIAGNOSIS — F1010 Alcohol abuse, uncomplicated: Secondary | ICD-10-CM

## 2020-03-01 DIAGNOSIS — G8929 Other chronic pain: Secondary | ICD-10-CM

## 2020-03-01 DIAGNOSIS — S069XAS Unspecified intracranial injury with loss of consciousness status unknown, sequela: Secondary | ICD-10-CM

## 2020-03-01 DIAGNOSIS — S069X9S Unspecified intracranial injury with loss of consciousness of unspecified duration, sequela: Secondary | ICD-10-CM

## 2020-03-01 DIAGNOSIS — E538 Deficiency of other specified B group vitamins: Secondary | ICD-10-CM

## 2020-03-01 MED ORDER — NALTREXONE HCL 50 MG OR TABS
ORAL_TABLET | ORAL | 4 refills | Status: DC
Start: 2020-03-01 — End: 2020-07-05

## 2020-03-01 NOTE — Progress Notes (Signed)
HEALTH RISK ASSESSMENT (HRA)  03/01/2020  Ronnie Mason is 69 year old     Care Providers:  Please list care providers who are outside [facility] (including specialists, eye doctor, naturopaths, etc):  PT specialites     SELF ASSESSMENT OF HEALTH:  1)  How do you rate your overall health in the past 4 weeks?     []  Excellent   [x]  Good   []  Fair   []  Poor    2)  Can you manage your health problems?      [x]  Yes    []  No     3)  Because of any health problems, do you need the help of another person with your personal care needs such as eating, bathing, dressing or getting around the house?      []  Yes   [x]  No    4)  Do you often get the emotional support you need?     [x]  Always   []  Usually   []  Sometimes   []  Rarely   []  Never    PSYCHOSOCIAL HEALTH:  In the past 2 weeks, how often have you been bothered by the following:     5)  Feelings that caused you distress or interfered with your ability to get along socially with family or friends?   [x]  Not All   []  Several Days   []  More than half the days   []  Nearly every day    6)  Feeling stress over health, finances, relationships or work?    [x]  Not All   []  Several Days   []  More than half the days   []  Nearly every day    7)  Body pain?   []  Not All   []  Several Days   []  More than half the days   [x]  Nearly every day    8) Fatigue ?   [x]  Not All   []  Several Days   []  More than half the days   []  Nearly every day    HEALTH AND HABITS:  9)  In the past 7 days, how many days did you exercise?     []   0  []  4   [x]   1  []  5   []   2  []  6   []   3  []  7    10)  On days when you exercised, for how long did you exercise (in minutes)?    [x]   30 minutes (please provide estimate of minutes, 0-120+)   []   Does not apply    11)  How intense was your typical exercise?   [x]   Light (like stretching or slow walking)   []   Moderate (like a brisk walk)   []   Heavy (like jogging or swimming)   []   Very heavy (like fast running or stair climbing)   []   I am currently not  exercising    12) In the past 7 days, how often did you eat 3 or more servings of fruits and vegetables   []  Not All   []  Several Days   []  More than half the days   [x]  Nearly every day     13)   In the past 7 days, how often did you eat 3 or more servings high fiber or whole grain foods   []  Not All   []  Several Days   [x]  More than half the days   []  Nearly every  day    14)  How would you describe the condition of your mouth and teeth, including false teeth or dentures?      [x]  Excellent ("see dentist every 6 mo")   []  Good   []  Fair   []  Poor    15)  Do you find yourself having trouble hearing people speak?      []   Yes   [x]   No    16)  Do you wear a hearing aid/device?      []   Yes   [x]   No    17)  Do you always use your seat belt in the car?     [x]   Yes   []   No  18)  Do you have a fire extinguisher in your home?     [x]  Yes   []   No    19)  Do you have a smoke detector?    [x]   Yes   []   No    FUNCTION AND MOBILITY:  Unless otherwise noted, please check one response for each question    In your present state of health, how much difficulty do you have with the following activities:    20)  Preparing food and eating   [x]  I can do this by myself   []  I need some help to do it   []  I cannot do this; another person needs to do it for me    21)  Bathing yourself   [x]  I can do this by myself   []  I need some help to do it   []   I cannot do this; another person needs to do it for me    22)  Getting dressed   [x]  I can do this by myself   []  I need some help to do it   []  I cannot do this; another person needs to do it for me    23)  Using the toilet   [x]  I can do this by myself   []  I need some help to do it   []  I cannot do this; another person needs to do it for me    24)  Moving around from place to place    [x]  I can do this by myself    []  I need some help to do it   []  I cannot do this; another person needs to do it for me    25)  Please check any aids or devices that you usually use for any of the above  activities (check all that apply):   [x]  Cane  []  Special or built up chair   []  Walker  []  Build up or special    []  Wheelchair  []  Devices used for dressing (button hook, zipper pull, etc)   []  Crutches  []   None of the above   ("have dmv pass")  26)  In the past year have you fallen or had a near fall?   [x]  Yes (pt report twice, wife said more)   []   No    27)  Are you afraid of falling?   []  Yes   [x]   No    28)  Do you have issues with balance or feeling unsteady?     [x]   Yes   []   No    29)  Do you feel safe in your home environment?   [x]  Yes   []   No    30)  Is there anything in your home that might make you trip or slip, and fall?    [x]  Yes (many boxes from parents' home to be cleared out soon)   []   No    31)  Do you ever leak urine or stool?    []  Yes   [x]  No    32)  Do you wear a liner, pad, or special underwear because of leakage?     []  Yes   [x]  No    In your present state of health how much difficulty do you have with the following activities:    33)  Shopping   [x]  I can do this by myself   []  I need some help to do it   []  I cannot do this; another person needs to do it for me    34)  Using the telephone   [x]  I can do this by myself   []  I need some help to do it   []  I cannot do this; another person needs to do it for me    35)  Housekeeping   [x]  I can do this by myself   []  I need some help to do it   []  I cannot do this; another person needs to do it for me    36)  Laundry   [x]  I can do this by myself   []  I need some help to do it   []  I cannot do this; another person needs to do it for me    37)  Driving or using transportation   [x]  I can do this by myself   []  I need some help to do it   []  I cannot do this; another person needs to do it for me    38)  Managing your own finances   [x]  I can do this by myself   []  I need some help to do it   []  I cannot do this; another person needs to do it for me     39)  Taking your own medications   [x]  I can do this by myself   []  I need some help  to do it    []  I cannot do this; another person needs to do it for me    SIGNS OF MEMORY ISSUES:  40)  Have you experienced any memory issues or problems with thinking?   [x]  Yes ("98%")   []  No    41)  Have any concerns about your memory been raised by family members, friends, caretakers, or others?          [x]  Yes   []  No    Screening and Preventive measures done most recently outside [facility] (check all that apply & provide details on where and when):   []  Pneumococcal vaccines (e.g. Prevnar, Pneumovax)   []  Influenza vaccine   []  Hepatitis B vaccine   []  Mammogram screening (women)   []  Pap smear (women)   []  Colorectal cancer screening   []  Diabetes screening   []  Cholesterol panel   []  Bone density screening   []  Eye exam   []  Abdominal aortic aneurysm screening      Health Maintenance section updated with information from Care Everywhere    ADVANCE CARE PLANNING:  Do you currently have this in place?  42)  POLST     []  Yes   []  No   [x]  Don't Know/Don't Remember  52)  Living will   []  Yes   []  No   [x]  Don't Know/Don't Remember    44)  Durable Power of Attorney for Health Care (also called DPOA)    []  Yes   []  No   [x]  Don't Know/Don't Remember    45)  Do you want to discuss advance care planning at your wellness visit?   []  Yes   []  No   [x]  Not Sure

## 2020-03-01 NOTE — Addendum Note (Signed)
Addended by: Cecille Rubin on: 03/01/2020 11:06 AM     Modules accepted: Kipp Brood

## 2020-03-01 NOTE — Progress Notes (Signed)
Ronnie Mason is a 69 year old male       BP 135/80   Pulse 84   Temp 36.1 C (Temporal)   Resp 14   Ht 6\' 2"  (1.88 m)   Wt (!) 106.1 kg (234 lb)   SpO2 95%   BMI 30.04 kg/m   Chief Complaint   Patient presents with   . Wellness       Patient seen with wife   Here for Medicare wellness - medicare wellness form scanned or entered and reviewed with patient  Electronic medical record reviewed and updated on problem list, surgical history , allergies , soc history and habits, care team , HM     HPI  History of:   pcp , MD      History of chronic pain alcohol use  Macrocytosis    Patient Care Team:  [name], MD as PCP - General (Internal Medicine)  [name], MD (Internal Medicine)  [name], MD as Cardiologist  (Cardiology)  Ward Chatters, MD (Sports Medicine)  Geri Seminole, DPM (Podiatry)  Holli Humbles, MD (Orthopedic Surgery)  Dixon Boos, MD (Gastroenterology)  Clydene Pugh, MD (General Surgery)  Casandra Doffing, ARNP (Urology)  Reggie Pile Dong-Hyun, DPM (Podiatry)  Hilarie Fredrickson, MD as Primary Contact (Pain Management)       Today follow-up above issues:      Discussed hm due along with other issues above   Health Maintenance Topics with due status: Overdue       Topic Date Due    Medicare Annual Wellness Visit Never done    Zoster Vaccine will do shingrix at [facility]  07/17/2013     Health Maintenance Topics with due status: Due Soon       Topic Date Due    Depression Screening (PHQ-2)    PHQ2 Total Score: 0       03/15/2020           ADL/IADL  [x]  Negative without problems  []  Needs Addressed        Adv directive/polst   [x]  Done  polst  In chart         Orders Only on 02/27/20   1. CBC with Diff   Result Value Ref Range    WBC 7.12 4.3 - 10.0 10*3/uL    RBC 4.15 (L) 4.40 - 5.60 10*6/uL    Hemoglobin 14.6 13.0 - 18.0 g/dL    Hematocrit 44 38 - 50 %    MCV 105 (H) 81 - 98 fL    MCH 35.2 (H) 27.3 - 33.6 pg     MCHC 33.6 32.2 - 36.5 g/dL    Platelet Count  - 400 10*3/uL    RDW-CV 12.7 11.6 - 14.4 %    % Neutrophils 65 %    % Lymphocytes 20 %    % Monocytes 11 %    % Eosinophils 3 %    % Basophils 1 %    % Immature Granulocytes 0 %    Neutrophils 4.64 1.80 - 7.00 10*3/uL    Absolute Lymphocyte Count 1.43 1.00 - 4.80 10*3/uL    Monocytes 0.76 0.00 - 0.80 10*3/uL    Absolute Eosinophil Count 0.22 0.00 - 0.50 10*3/uL    Basophils 0.05 0.00 - 0.20 10*3/uL    Immature Granulocytes 0.02 0.00 - 0.05 10*3/uL    Nucleated RBC 0.00 0.00 10*3/uL    % Nucleated RBC  0 %   2. Pathologist Review   Result Value Ref Range    Pathologist Review Macrocytosis. SEERPT       No results found.      REVIEW OF SYSTEMS -   Not reviewed if not marked   P=Positive  N=Negative      P   N    [x]  []  Constitutional:   Chronic pain         PHYSICAL EXAM    Physical Exam  Constitutional:       Appearance: He is obese.   HENT:      Head: Normocephalic.   Neck:      Musculoskeletal: Normal range of motion.   Neurological:      Mental Status: He is alert and oriented to person, place, and time. Mental status is at baseline.      Gait: Gait normal.   Psychiatric:         Mood and Affect: Mood normal.         Behavior: Behavior normal.         Thought Content: Thought content normal.         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 [name] sac  I think you wanted it on the 09/2018 which I think would be somewhat difficult to coordinate    He is trying to minimize his alcohol intake    His lipid is controlled on medication to stay on atorvastatin    To follow up dr Reino Kent     Discussed hm due along with other issues above   Health Maintenance Topics with due status: Overdue        Health Maintenance   Topic Date Due   . Medicare Annual Wellness Visit  Never done   . Zoster Vaccine (2 of 3) he will get his Shingrix at the pharmacy 07/17/2013   . Depression Screening (PHQ-2)  03/15/2020   .  Influenza Vaccine (1) 05/03/2020   . Colorectal Cancer Screening  09/24/2020   . Lipid Disorders Screening  02/09/2021   . Diabetes Screening  02/10/2023   . DTaP, Tdap, and Td Vaccines (4 - Td) 11/29/2025   . Pneumococcal Vaccine: 65+ years  Completed   . Hepatitis C Screening  Completed   . COVID-19 Vaccine  Completed   . Hepatitis A Vaccine  Aged Out   . Hepatitis B Vaccine  Aged Out         No follow-ups on file.    Patient   express understanding of care plan/medications  Risk of taking medication properly and follow up discussed especially to call if problem    Created with the assistance of voice recognition software  dictation

## 2020-03-04 ENCOUNTER — Telehealth (INDEPENDENT_AMBULATORY_CARE_PROVIDER_SITE_OTHER): Payer: Self-pay | Admitting: Internal Medicine

## 2020-03-04 NOTE — Telephone Encounter (Addendum)
Spoke to patient in the waiting room to notify him that he would be waiting for up to 30 minutes for evaluation & that we do not have any appointments today. He gave me a card for his pain doctor who told him he could be seen here today but to make sure we called her so "we don't mess anything up". Patient confirms he has a Clinical cytogeneticist active. Advised him to call the pain clinic doctor to see if they can do a telemedicine appt for him. He will reach out to Dr Araceli Bouche & see if that is available. We will connect later this afternoon to see if he needs additional assistance or assessment.  Sue Lush, RN  He had a epidural for L4-S1 a week or 1.5 weeks ago. "it worked until it didn't". [name] did not call Dr Araceli Bouche or his PCP Dr Penelope Galas as he agreed this morning. He reports that he is on vacation for about 5 more days & has to use a cane to walk. He has pain that "goes into my groin/hip & hip flexor & the top of my leg & into the calf". He at the time of his epidural informed Dr Araceli Bouche that he would be up on [place] for about a week. She told him to check "in with the clinic" once he was up here. [name] is instructing [place] to call them & make sure we don't mess anything up.  Informed [name] I will attempt to call Dr Nathaniel Man office & that we are a primary care location & not able to do many things they are able to do in [place]. He instructs [place] to talk to [place] or [place].  Call placed to Dr Nathaniel Man office & was on hold for over 15 minutes & unable to fstay on line for >30 minutes (per the message). Sent a message

## 2020-03-04 NOTE — Telephone Encounter (Signed)
Left message for patient to call clinic back    Joany Khatib Z Aiva Miskell, RN

## 2020-03-04 NOTE — Telephone Encounter (Signed)
Right sided groin/leg pain that runs down his leg through his calf.     He can barely stand/walk and would like to speak to a nruse about this     Patient waiting in lobby    Roberto Scales, PSS/PSR

## 2020-03-08 ENCOUNTER — Other Ambulatory Visit (HOSPITAL_BASED_OUTPATIENT_CLINIC_OR_DEPARTMENT_OTHER): Payer: Self-pay | Admitting: Anesthesiology

## 2020-03-08 DIAGNOSIS — G894 Chronic pain syndrome: Secondary | ICD-10-CM

## 2020-03-08 MED ORDER — MELOXICAM 7.5 MG OR TABS
7.5000 mg | ORAL_TABLET | Freq: Every day | ORAL | 0 refills | Status: DC | PRN
Start: 2020-03-08 — End: 2020-07-05

## 2020-03-08 NOTE — Telephone Encounter (Signed)
Patient calls back today and is back in town. He would like a prescription for Meloxicam and would like that sent to the [facility] in [facility]. He says he went to his PCP, who is deferring all of his pain management to [facility].

## 2020-03-25 ENCOUNTER — Other Ambulatory Visit (HOSPITAL_BASED_OUTPATIENT_CLINIC_OR_DEPARTMENT_OTHER): Payer: Self-pay

## 2020-03-25 DIAGNOSIS — G894 Chronic pain syndrome: Secondary | ICD-10-CM

## 2020-03-25 NOTE — Telephone Encounter (Signed)
Medication: Meloxicam  Requested by: Patient    Last refill written: 03/08/2020  Written by: Dr Araceli Bouche    Last appointment: 02/22/2020 with Dr. Araceli Bouche  Scheduled follow up: 03/29/2020 with Dr. America Brown PMP Medication Dispense History (from 03/31/2019 to 03/25/2020)     Dispensed Days Supply Quantity   PREGABALIN 50 MG CAPSULE 12/21/2019 90 270 unspecified   PREGABALIN 100 MG CAPSULE 12/20/2019 90 270 unspecified   PREGABALIN 100 MG CAPSULE 11/06/2019 30 90 unspecified   PREGABALIN 100 MG CAPSULE 09/30/2019 30 90 unspecified   PREGABALIN 50 MG CAPSULE 09/30/2019 60 180 unspecified   PREGABALIN 100 MG CAPSULE 08/26/2019 30 90 unspecified   PREGABALIN 50 MG CAPSULE 07/20/2019 60 180 unspecified

## 2020-03-27 ENCOUNTER — Ambulatory Visit (HOSPITAL_BASED_OUTPATIENT_CLINIC_OR_DEPARTMENT_OTHER): Payer: Medicare Other | Admitting: Addiction (Substance Use Disorder)

## 2020-03-27 ENCOUNTER — Ambulatory Visit: Payer: Medicare Other | Attending: Psychiatry | Admitting: Psychiatry

## 2020-03-27 DIAGNOSIS — G894 Chronic pain syndrome: Secondary | ICD-10-CM | POA: Insufficient documentation

## 2020-03-27 DIAGNOSIS — F0150 Vascular dementia without behavioral disturbance: Secondary | ICD-10-CM | POA: Insufficient documentation

## 2020-03-27 DIAGNOSIS — F1020 Alcohol dependence, uncomplicated: Secondary | ICD-10-CM | POA: Insufficient documentation

## 2020-03-27 DIAGNOSIS — F01A0 Vascular dementia, mild, without behavioral disturbance, psychotic disturbance, mood disturbance, and anxiety: Secondary | ICD-10-CM

## 2020-03-27 MED ORDER — NALTREXONE 380 MG IM SUSR
380.0000 mg | INTRAMUSCULAR | Status: DC
Start: 2020-03-28 — End: 2020-06-06
  Administered 2020-04-11 – 2020-06-06 (×3): 380 mg via INTRAMUSCULAR

## 2020-03-27 NOTE — Progress Notes (Signed)
[facility] ADDICTION CLINIC NOTE    DATE: 03/27/20    LOCATION OF SERVICE: In-clinic Visit   Duration  E&M (Mins): 45 minutes   Add on Psychotherapy:   Duration (Mins): 45 minutes  Type of Psychotherapy/Interventions used: Motivational Enhancement Therapy & Harm Reduction Therapy, elements of CBT for SUDs/chronic pain.     ID: Ronnie Mason is a 69 year old male who is here today for an Addiction Psychiatry appointment.     PROBLEM LIST:  Patient Active Problem List    Diagnosis Date Noted   . Syncope    . Hypotension    . Carotid Sinus Hypersensitivity    . URI (upper respiratory infection)    . Macrocytosis without anaemia 02/12/2020   . Myofascial pain 10/20/2019   . MRSA (methicillin resistant staph aureus) culture positive 07/13/2019   . Alcoholism in remission [facility]) 03/16/2019     03/16/2019       . Closed fracture of one rib of right side 01/26/2019     See x ray 01/26/2019   Right anterior lat sub acute chronic      . Enuresis 09/06/2018   . Chronic left-sided low back pain without sciatica 06/21/2018   . Acute left-sided low back pain without sciatica 03/14/2018   . Greater trochanteric pain syndrome of right lower extremity 11/25/2017   . Complex tear of medial meniscus of left knee as current injury 09/08/2017   . Primary osteoarthritis of left knee 09/08/2017   . Right lumbar radiculitis 05/05/2017   . 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 Primary osteoarthritis of right hip 01/11/2017     Osteoarthritis of right hip x ray report - moderate to advanced radiology read 01/11/2017     . 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([place]) 08/06/2016   . Impaired mobility and ADLs 04/08/2016   . Difficulty in walking, not elsewhere classified 03/12/2016   . Vitamin D deficiency 02/13/2016   . Balance problem 01/23/2016   . Chronic pain of both shoulders 01/23/2016     X ray arthritis  02/19/2016       . 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 Intraparenchymal hemorrhage of brain ([place]) 01/06/2016    . 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 Essential hypertension 12/16/2015   . Essential tremor 12/16/2015   . Hx of spontan intraparenchymal intracran bleed assoc with hypertension 12/16/2015     [place] [facility] 4/29- 12/10/2015  Non-traumatic intraparenchymal hemorrhage, hypertensive of the left thalamus   Traumatic hemorrhage   -- falcine subdural hemorrhage, small cortical subarachnoid     . Alcohol abuse 12/16/2015   . Cerebral ventriculomegaly 10/09/2015   . 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 History of alcohol dependence ([place]) 06/26/2015   . 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 ([place]) 05/07/2015   . Idiopathic peripheral neuropathy 03/15/2015   . 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 Sensory neuropathy 01/18/2014   . Tremor 04/20/2013   . Fall 02/23/2013     BP < 100  Occasioanal tri[p and leg weakness     . Back pain, lumbosacral 02/15/2013   . Lumbar radiculopathy, chronic 02/15/2013   . Neck pain 02/15/2013   . Chronic pain 12/21/2012     Now at [facility], DO, Robbie Lis    Neck pain -burn c3-7 on left side per patient  Some trigger injection    Lumbar pain-since 80's better with exercise     .  Bee sting allergy 12/21/2012     hives     . Numbness and tingling of leg 12/21/2012     Left calf -left foot long term suspect from back-suspect L 45     . Chronic back pain 12/21/2012     Mri in mid scape 1/214  Multilevel degenerative disc disease of the lumbar spine. Circumferential disc bulge at all levels below and including L1-2. No significant central spinal canal stenosis or neuroforaminal narrowing.  2. Mild retrolisthesis of L5 on S1.           . B12 deficiency 12/21/2012     Borderline low 5 /21/2014     . Chronic renal insufficiency, stage III (moderate) 12/21/2012     5/ 2013     . Hyperlipidemia 09/26/2012   . Cervicalgia 03/11/2010     Radiofrequency ablation of c3 to c 7          CHIEF  COMPLAINT: AUD     INTERVAL HISTORY:    SUD:  -AUD: ~4-->2-3 glasses wine/night; a few instances of use of liquor in past month (will drop by store while running errands; wife considering taking over these tasks to help him avoid exposure/opportunity). States that while he appreciates risks of continued use, his goal is controlled drinking at this time. Is aware of NIAAA recommendations (given age and sex, </3drk/24hr, </=7/wk), which he still exceeds. Discussed strategies for limiting access to use (e.g., only at dinner, having only enough wine in kitchen/dining area to meet couples drinking max goals.) Has noted some possible increased sense of control after beginning NTX, struggling with quantities of pills/meds taking, wishes to switch to XR-NTX to facilitate compliance; though liquor use has significantly declined, wine use is above intended target; wife drinks as well but is helping w/structure and monitoring. No recent reported falls, no w/d symptoms.     Psychiatric: has denied any significant history of psychiatric symptoms (depression, anxiety, mania/hypomania, PD, delusional thought content, ADHD); did suffer IPH several years ago with apparent neurocognitive deficits thereafter. Duloxetine previously titrated to '120mg'$ /day (no SEs but also no perceived benefit (for pain.))    Somatic/General Medical:   -chronic pain and numbness in bilat lower extremities (no other pain in shoulders, hips, neck), with more severe pain in rt hip and LE (benefitted from inj for 4 days, then had severe pain and reached out to APS), cared for by Dr. Lisbeth Ply at the [facility] pain [facility]; history of multiple GLFs (through first half of 2021, none in interval period), sometimes in the context of drinking though he cites LE numbness and poor balance is being significant contributing factors.  Denies new or significant headaches, dizziness, vision changes, S OB, CP, abdominal pain or other GI distress, recent changes in urination, new  numbness/tingling/paresthesias (states normal sensation in his upper extremities is preserved)    Psychosocial: Stably housed with wife, has supportive children; retired without significant financial concerns. Freq trips to house on [place].    Reviewed indications, relative risks & benefits, and alternatives to treatment options as well as relevant aspects of pharmacology and reviewed key elements of and expectation regarding the addictions program. Pt was given opportunities to ask and have questions answered and agrees with the treatment plan outlined below.    SUBSTANCE USE HISTORY:       -- Opioid use history: Denies      -- Alcohol: began drinking 8th grade, progressed in frequency and quantity of through college but was not function-impairing; drink  wine throughout her adult life (usually with dinner, socialization), however several years ago this progressed to a fifth of burbon/day several years ago (patient states that when opioids for pain were rapidly tapered, he "turned to alcohol" to manage pain and has since lost control over use), which, following a significant fall and CNS injury, he switch to wine, drinking approximately 2 bottles/day about a year ago, with more recent efforts to cut down again (in the context of support/encouragement from family), leading to a 1 week period of sobriety in May 2021, with subsequent return to drinking of 2 glasses with dinner (now, increased up to 1 bottle nightly by 02/2020).  Patient indicates that has been "the extent of it" though his wife indicates that she has been finding bottles of wine/bourbon/whiskey/vodka from time to time in and around the house as recently as 01/2020 (the patient at times seems surprised are unaware of this, though also indicating that earlier in the spring when his wife was gone for family matters he would walk to the store by alcohol regularly.).  He states he is not until recently undergone any formal treatment involvement commonly  either psychosocial or pharmacologic until 6/21 (began NTX).  He states he has never struggled with withdrawal although in late spring 2021 back he did note an increase in his essential tremor and some nausea that may have been associated with cessation of drinking while he was in the [facility].      -- Sedative-Hypnotic: Denies      -- Stimulants: Denies      -- MDMA: denies      -- Inhalants: denies       -- Hallicinogens: denies      -- PCP: denies      -- Cannabis/cannabinoids: Denies      -- Tobacco: Denies    PSYCHIATRIC HISTORY:  -Diagnoses: neurocognitive disorder (likely vascular etiology)  -Hospitalizations: Denies  -ITAs: Denies  -Suicide Attempts: Denies  -Outpt Treatment Hx: Denies  -Previous Psychiatric Meds: Memantine  -Current Psychiatric Meds: Duloxetine 185m/day (for pain), pregabalin (for pain)    PAST MEDICAL HISTORY:  -Chronic shoulder/hip/back/neck pain, lumbar radiculopathy and lower extremity peripheral neuropathy, hyperlipidemia, HTN, 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 Acetaminophen 500 MG Oral Tab 2 tablets bid     . Alpha-Lipoic Acid 300 MG tablet Take 1 tablet by mouth daily.     .Marland KitchenamLODIPine 5 MG tablet Take 1 tablet (5 mg) by mouth 2 times a day. 90 tablet 3   . atorvastatin 10 MG Oral Tablet Take 1 tablet (10 mg) by mouth daily. 90 tablet 2   . BENFOTIAMINE OR Take 250 mg by mouth daily.     . Cholecalciferol (VITAMIN D3) 2000 units Oral Cap Take 1  capsule (2,000 Units) by mouth daily. For low level 1 capsule 1   . Cyanocobalamin 1000 MCG Oral Tab one per day over-the-counter started 5/21 /2012 this level borderline low 1 Tab  1   . diclofenac 1 % gel Apply 4 g topically 4 times a day. Apply to 2 gram four times daily to neck, trapezius muscle, and low back. Use a max of 32 grams a day. 300 g 4   . diclofenac 1 % gel      . DULoxetine 60 MG DR capsule Take 1 capsule (60 mg) by mouth 2 times a day. 180 capsule 1   . EPINEPHrine 0.3 MG/0.3ML auto-injector Inject as instructed per patient package insert, 0.3 mg intramuscularly or subcutaneously into the thigh, if needed to treat anaphylaxis 4 each 1   . lidocaine 5 % patch Apply 1 patch onto the skin daily. Apply to painful area for up to 12 hours in a 24 hour period. 30 patch 10   . lisinopril 20 MG tablet Take 1 tablet (20 mg) by mouth every 12 hours. 180 tablet 3   . meloxicam 7.5 MG tablet Take 1 tablet (7.5 mg) by mouth daily as needed. For pain/inflammation. Take with food. 10 tablet 0   . naltrexone 50 MG tablet Take 1.5 tablets daily. 45 tablet 4   . pregabalin 100 MG capsule TAKE ONE CAPSULE BY MOUTH THREE TIMES A DAY 270 capsule 1   . pregabalin 50 MG capsule TAKE ONE CAPSULE BY MOUTH THREE TIMES A DAY 270 capsule 1   . primidone 50 MG tablet TAKE ONE TABLET BY MOUTH TWICE DAILY 180 tablet 1   . solifenacin 5 MG tablet TAKE ONE TABLET BY MOUTH ONCE DAILY (Patient not taking: Reported on 02/22/2020) 90 tablet 0   . thiamine 100 MG tablet Take 1 tablet by mouth daily.      [name] triamcinolone 0.1 % cream As needed to rear bid x 1 week 15 g 1     No facility-administered medications prior to visit.        ALLERGIES:  Review of patient's allergies indicates:  Allergies   Allergen Reactions   . Adhesives Skin: Rash   . Bee Venom Skin: Hives, Skin: Itching and Swelling   . Gabapentin      Flu like symptoms, diarrhea, body ache upset stomach   . Methocarbamol Other     Patient's wife reports cognitive difficulties  ("goofy")     MEDICAL REVIEW OF SYMPTOMS:  Other than in the HPI, all other systems are negative.    PHYSICAL EXAM:  There were no vitals taken for this visit.    -GENERAL: in no acute distress, non-diaphoretic   -HEENT: sclera/conjunctiva clear, PERRLA, no significant bruising or excoriations  -RESPIRATORY: non-labored breathing  -EXTREMITIES: No visible deformities, edema (albeit limited physical exam)  -SKIN : No skin abscesses, lesions, rashes, ulcers on exposed skin  -NEUROLOGICAL: Bilateral UE tremor (with reported chronic essential tremor), antalgic and slightly wider set gait.      MENTAL STATUS EXAM:  General appearance: appropriately groomed and casually but neatly dressed  Behavior/activity: appropriate and reserved but cooperative  Speech: generally normal rate and prosody  Affect: constricted  Mood: euthymic and though some frustration with pain management  Thought Form: circumstantial  Thought Content: No SI/HI and No AH or VH  Orientation: person, place and situation  Attention/concentration: attentive  Memory: impaired  Insight and judgment: some insight  Additional comments/details:     LAB RESULTS:  Results for orders placed or performed in visit on 02/27/20   CBC with Diff   Result Value Ref Range    WBC 7.12 4.3 - 10.0 10*3/uL  RBC 4.15 (L) 4.40 - 5.60 10*6/uL    Hemoglobin 14.6 13.0 - 18.0 g/dL    Hematocrit 44 38 - 50 %    MCV 105 (H) 81 - 98 fL    MCH 35.2 (H) 27.3 - 33.6 pg    MCHC 33.6 32.2 - 36.5 g/dL    Platelet Count 248 150 - 400 10*3/uL    RDW-CV 12.7 11.6 - 14.4 %    % Neutrophils 65 %    % Lymphocytes 20 %    % Monocytes 11 %    % Eosinophils 3 %    % Basophils 1 %    % Immature Granulocytes 0 %    Neutrophils 4.64 1.80 - 7.00 10*3/uL    Absolute Lymphocyte Count 1.43 1.00 - 4.80 10*3/uL    Monocytes 0.76 0.00 - 0.80 10*3/uL    Absolute Eosinophil Count 0.22 0.00 - 0.50 10*3/uL    Basophils 0.05 0.00 - 0.20 10*3/uL    Immature Granulocytes 0.02 0.00 - 0.05 10*3/uL    Nucleated RBC  0.00 0.00 10*3/uL    % Nucleated RBC 0 %   Pathologist Review   Result Value Ref Range    Pathologist Review Macrocytosis. SEERPT     Results for KEVAN, PROUTY (MRN [id number]) as of 02/27/2020 11:42   Ref. Range 02/10/2020 09:12   Sodium Latest Ref Range: 135 - 145 meq/L 141   Potassium Latest Ref Range: 3.6 - 5.2 meq/L 4.8   Chloride Latest Ref Range: 98 - 108 meq/L 101   Carbon Dioxide, Total Latest Ref Range: 22 - 32 meq/L 31   Anion Gap Latest Ref Range: 4 - 12  9   Glucose Latest Ref Range: 62 - 125 mg/dL 104   Urea Nitrogen Latest Ref Range: 8 - 21 mg/dL 20   Creatinine Latest Ref Range: 0.51 - 1.18 mg/dL 0.93   eGFR, Calculated Latest Ref Range: >59 mL/min/1.73_m2 >60   GFR, Information Unknown Calculated GFR by CKD-EPI equation. Inaccurate with changing renal function. See http://depts.was...   Calcium Latest Ref Range: 8.9 - 10.2 mg/dL 9.7   Cholesterol (Total) Latest Ref Range: <200 mg/dL 264 (H)   Triglyceride Latest Ref Range: <150 mg/dL 206 (H)   Cholesterol (LDL) Latest Ref Range: <130 mg/dL 123   Cholesterol (HDL) Latest Ref Range: >39 mg/dL 100   Non-HDL Cholesterol Latest Ref Range: 0 - 159 mg/dL 164 (H)   Cholesterol/HDL Ratio Unknown 2.6   Lipid Panel, Additional Info. Unknown (NOTE)   AST (GOT) Latest Ref Range: 9 - 38 U/L 20   ALT (GPT) Latest Ref Range: 10 - 48 U/L 19   Alkaline Phosphatase (Total) Latest Ref Range: 36 - 161 U/L 87   Bilirubin (Total) Latest Ref Range: 0.2 - 1.3 mg/dL 0.5   Protein (Total) Latest Ref Range: 6.0 - 8.2 g/dL 7.1   Albumin Latest Ref Range: 3.5 - 5.2 g/dL 4.4   WBC Latest Ref Range: 4.3 - 10.0 10*3/uL 5.77   RBC Latest Ref Range: 4.40 - 5.60 10*6/uL 4.45   Hemoglobin Latest Ref Range: 13.0 - 18.0 g/dL 15.8   Hematocrit Latest Ref Range: 38 - 50 % 47   MCV Latest Ref Range: 81 - 98 fL 107 (H)   MCH Latest Ref Range: 27.3 - 33.6 pg 35.5 (H)   MCHC Latest Ref Range: 32.2 - 36.5 g/dL 33.3   Platelet Count Latest Ref Range: 150 - 400 10*3/uL 239   RDW-CV Latest Ref  Range: 11.6 -  14.4 % 13.2   % Neutrophils Latest Units: % 60   % Lymphocytes Latest Units: % 23   % Monocytes Latest Units: % 13   % Eosinophils Latest Units: % 3   % Basophils Latest Units: % 1   % Immature Granulocytes Latest Units: % 0   % Nucleated RBC Latest Units: % 0   Neutrophils Latest Ref Range: 1.80 - 7.00 10*3/uL 3.45   Absolute Lymphocyte Count Latest Ref Range: 1.00 - 4.80 10*3/uL 1.32   Monocytes Latest Ref Range: 0.00 - 0.80 10*3/uL 0.74   Absolute Eosinophil Count Latest Ref Range: 0.00 - 0.50 10*3/uL 0.16   Basophils Latest Ref Range: 0.00 - 0.20 10*3/uL 0.08   Immature Granulocytes Latest Ref Range: 0.00 - 0.05 10*3/uL 0.02       IMAGING/EKG/OTHER STUDIES REVIEWED:  EKG 12-LEAD  Order: [id number]  Status:  Final result Visible to patient:  Yes (MyChart) Next appt:  04/04/2020 at 10:00 AM in Pain Management Art Buff, MD)   Component   Ref Range & Units 02/09/19 1556    Ventricular Rate   BPM 87     Atrial Rate   BPM 87     P-R Interval   ms 162     QRS Duration   ms 70     Q-T Interval   ms 368     QTC Calculation   ms 442     P Axis   degrees 15     R Axis   degrees 31     T Axis   degrees 32     Diagnosis  Normal sinus rhythm   Normal ECG   When compared with ECG of 12-Oct-2017 10:58,   No significant change was found   Confirmed by DODGE M.D., STEPHEN [phone]) on 02/10/2019 11:29:37 AM          Specimen Collected: 02/09/19 15:56 Last Resulted: 02/10/19 11:29               PRESCRIPTION MONITORING PROGRAM RESULTS:  Medication Dispense History (from 03/28/2019 to 03/27/2020)     Dispensed Written Strength Quantity Refills Days Supply Provider Pharmacy   PREGABALIN 50 MG CAPSULE 12/21/2019 12/14/2019  270 unspecified  90 KRAFT, [facility] #[id number]   PREGABALIN 100 MG CAPSULE 12/20/2019 12/14/2019  270 unspecified  90 [facility] #[id number]   PREGABALIN 100 MG CAPSULE 11/06/2019 05/23/2019  90 unspecified  30 [facility] #[id number]   PREGABALIN 100 MG CAPSULE  09/30/2019 05/23/2019  90 unspecified  30 [facility] #[id number]   PREGABALIN 50 MG CAPSULE 09/30/2019 05/23/2019  180 unspecified  60 K[facility]FT, [facility] #[id number]   PREGABALIN 100 MG CAPSULE 08/26/2019 05/23/2019  90 unspecified  30 [facility]FT[id number]   PREGABALIN 50 MG CAPSULE 07/20/2019 05/23/2019  180 unspecified  60 KRAFT, [id number]          SUICIDE SAFETY ASSESSMENT:  Suicide Ideation Risk Category: Low: No SI, or, only passive death wishes    Risk factors include: substance use disorder    Suicide Ideation protective factors: hope for future, responsibility to others, well engaged in treatment, high spirituality or belief that suicide is immoral, protective social network and willingness to follow crisis plan    OVERALL SUICIDE ASSESSMENT:     At present, pt is estimated to be at low acute risk for harm to self or others (though pt's baseline risk, relative to the  general population, is increased) based on the balance of the following risk/protective factors: age, gender, race/ethnicity, sexual orientation, degree of social isolation/support, current psychiatric symptom-burden, SU hx, hx of SI/self-harm, current access to means, estimated personality structure, relative severity of comorbid medical issues/pain, other acute on chronic psychosocial stressors, engagement in treatment. Pt should, however, be monitored for significant changes in contextual and intra-personal factors that could enhance risk (e.g., major losses, worsening psychiatric symptom burden, relapse/escalating SU.)    ASSESSMENT:     Pt is a married, house, retired 69 year old male with history of AUD, neurocognitive disorder with prior CVA, chronic pain and other significant medical history here for an Addiction Psychiatry appointment.  Based on available data, pt meets criteria for the SUDs, general psychiatric, and other medical conditions listed below (under diagnoses).  This includes  meeting criteria for alcohol use disorder, severe, Time spent with patient and wife discussing diagnosis, etiology, potential  treatment options (psychosocial and pharmacologic).  Both were given opportunities to ask and have all questions answered. The treatment plan, below, was developed in a collaborative manner.  Mr. Meddaugh is aware of risks of continued etoh use as well as NIAAA recommended limits; at this time, however, he is interested in controlled drinking with assistance of naltrexone and gabapentinoid (pregabalin), which may be a synergistic combination for alcohol cessation/controlled use. In the interval period, he had experienced some success in decreased his average use quantity by ~1 stnd drink/day to 2-3 glasses wine nightly, moving towards his stated goal of no more than 1.5 glasses of wine nightly. Discussed behavioral/structual interventions as well as pharmacotherapy (instructed on proper administration of medications, including the potential side effects) and verbalized understanding of the information provided, opting to switch NTX from PO to IM formulations in order support compliance, while continuing pregabalin at current dose.  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, history of GLFs, essential tremor, and history of vitamin D and B12 deficiencies    PLAN:     ## Alcohol Use DO:  -No indication of need for withdrawal management (and based on history provided in terms of prior withdrawal experience and current use, would appear to be at low risk for  future severe withdrawal in the setting of cessation)  - c/w thiamine 119m qday (while alcohol use continues)  - c/w pregabalin 100 mg TID  - naltrexone: will change dose/formulation from 783mday --> XR-NTX 38044mM (04/11/20)  - Psychosocial (usually the more effective intervention, larger effect size than MAT for AUD): encourage participation in psychotherapy for SUDs w/Mike, further involvement in IOP, AA/NA; residential tx may be an option if outpt treatment is ineffective.  - Pt to present to ED/hospital with emergence of any significant w/d sxs, confusion, seizure or hallucinations    # OTHER:  -Patient to continue care with other providers (including pain, PCP)  -Continue to work on reduction/mitigation of fall risk  -Consider recheck of B12, vitamin D given history of deficiencies (w/repletion and macrocytosis -- though may be etoh-related -- as indicated)    # SAFETY:  - no indication of acute  medical concern or acute risk of harm to self or others  - patient instructed to present to clinic/MH provider, the local Emergency Department, dial 911, and/or call the crisis line ([phone], or [phone]) should mental health status worsen or require immediate attention.     # FOLLOW-UP:  -RTC from XR-NTX inj on 9/9, then with this provider in 4 weeks for follow-up.  -Pt to f/u with his addiction clinic team, is encouraged to attend groups, per pt's established program.  -Pt to f/u with primary care, psychiatric services, other specialty medical care, as needed.

## 2020-03-27 NOTE — Progress Notes (Signed)
[facility] Addictions Program  1:1 Session Note    Problem #: 1,2,3,4   Time: 30 Minutes  Notes: D) Patient just finished his appt with the psychiatrist. Patient is going to be getting the monthly shot of vivitrol instead of naltrexone. Patient reported that his drinking has decreased down to 1-2 glasses of wine per night. Patient discussed some of the strategies he and his wife have used to cut down their drinking. Patient will be going to his [place] home - possibly by himself for a few days. We discussed how he can take care of himself during this time. Patient has been dealing with a lot of physical pain lately - he has a doctor appointment later this week. Patient will call to schedule his next appt in a couple weeks. A) Patient appeared open and honest during this session.  P) Attend all meetings, groups, and appointments.              [name] M.A. CDP

## 2020-03-28 NOTE — Progress Notes (Signed)
CHIEF COMPLAINT:   Chief Complaint   Patient presents with   . Leg Pain     right   . Low Back Pain   . Foot Pain     right   Back pain    HISTORY OF PRESENT ILLNESS:  The purpose of today's visit is interval reevaluation of back pain. The patient has a pain history significant for bilateral lumbar facet arthropathy with prior positive response to RFA, but less response to more recent diagnostic injections as well as myofascial back pain. Most recently he had LESI which relieved his pain for 3 days. He also got a short dose of meloxicam which helped.     Today is concerned about the right hip pain that radiates to the groin. It is progressively getting worse.  He is participating in physical therapy which helps. He wanted to know if we could inject his right hip bursa. He has been seen by Dr [name] before but it was a while ago.    He continues to have the low back with radiation to the left leg above the knee. This is largely unchanged from previous after the 4 day relief of LESI. In addition his back feels right.     He and wife were also happy with meloxicam and wanted to  Continue.     He also states that he trying to abstain from alcohol      REVIEW OF SYSTEMS:  The review of systems documented by our clinic staff was provided by the patient. I reviewed and confirmed these details with Ronnie Mason and have no further detail to add.    PHYSICAL EXAMINATION:  BP (!) (P) 154/93   Pulse (P) 78   Temp (P) 36.3 C (Temporal)   Ht (P) 6\' 2"  (1.88 m)   Wt (!) (P) 104.3 kg (230 lb) Comment: per patient  SpO2 (P) 98%   BMI (P) 29.53 kg/m    Constitutional:Constitutional: well-appearing, well-groomed and cooperative, wife accompanying   Skin, exposed parts: color normal, temperature normal  Eyes: Negative for miosis bilaterally. Pupils appropriate for room lighting.   Neurologic: alert  Musculoskeletal: Tenderness to palpation over bilateral trochanter, R>L   Psychiatric: Judgement/insight are good. Mood is good. Affect is  appropriate.     Imaging  HIP MRI 01/2017  IMPRESSION:  1. Severe right hip osteoarthritis with extensive superior labral   tear and degeneration extending anteriorly and posterior.       IMPRESSION:   Ronnie Mason is a 69 year old male with:  1. Right hip pain, Xrays from 2018 reviewed showing severe OA. Right greater trochanteric bursitis (though this seems to be separate issue)  2. Axial low back painsecondary tofacet arthropathy at L4-L5,L5-S1 with prior positive response to RFA(last in 2017) as well as lumbar facet arthropathy. More recent diagnostic medial branch blocks have not been helpful so do not plan to proceed to RFA again.  Significant ongoing myofascial component with pain radiating from back to hips.  3. Lower back pain with radiation into bilateral lower extremities radicular in nature (multiple disc bulge on MRI). Recent LESI without long term improvement    4. Deconditioning- Ongoing PT    5.  Prior treatment failures including:Short duration of benefit forinterlaminar lumbarepidural steroids and TFESI, memantine, Naltrexone    6.  Multiple prior injections by outside providers(exluding MBB and trigger point injections):   Dr. 2018  ? 03/23/2018 -LEFT L5 TFESI with 80-85%relief for about 3-4 weeksof just axial back pain.  ?  09/15/2017 -Bilateral L5TFESI with 50-75% reliefof mixture of low back and leg pain.  ? 05/19/2017 -RIGHT L5 TFESIwith 100% relief ofRadicularsymptoms for about 3 months  ? 08/12/2016 - L5-S1 ILESI  ? 07/01/2016 - Ultrasound guided right trochanteric bursa injection   ? 05/20/2016 - Bilateral shoulder injections  ? 05/13/2016 - Right L3 L4 L5/S1 RFA   ? 02/19/2016 - Left L3, L4, L5/S1 RFA   ? 12/27/2012 - L5-S1 IL ESI  ? 08/31/2012 - Left L5 TFESI  ? 03/30/2012 - Left L4/5, L5/S1 facet inj  ? 02/10/2012 - Left C6 C7 MB RFA  ? 07/03/2011 - Left L3, L4, L5/S1 RFA  ? 05/12/2011 - Left C4, C5, C6 RFA    7. 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 Hip pain. Reviewing Xray from 2018, there was evidence of severe OA on the right hip and would recommend touching base with  Dr. [name] who has seen the patient before to evaluate if any interventions are appropriate and/or if patient needs surgical evaluation. Based on reproducibility of pain with palpation of muscle and tendons in the groin more suspcious for MSK etiology, though it is complicated as some description of symptoms sounds to be radicular. Admittedly, hip exam and pathology is not our expertise. We are able to provide bursa or intrarticular hip injections if needed, but it was not clear today that either would reduce symptoms a meaningful degree for a sustained period.    Referral placed    2. Medications management:    Increase pregabalin to 600 mg/daily. 200mg  TID a, 300 mg  at night for one week, then 250mg  AM/300 mg nightly, then300 mg  BID. If he stay at this dose could change tablet strength to 300mg  or 150mg .  Duloxetine 60mg  BID  Acetaminophen   In regards to meloxicam, because of long term effect of NSAIDs on kidney and GI track,We would recommend talking to  PCP about risks and benefits in setting of alcohol use and hx of mild renal insufficiency and HTN. NSAIDs could be beneficial if tolerated and PCP agrees and able to monitor for kidney function.    3. Rehabilitation;   Encouraged continuing PT.  Continued to encourage treatment for AUD.    4. Introduced concept of pain centralization. Resources given in the AVS.  In the future, we can discuss mindfulness coaching.     We have explained that chronic pain is typically associated with  altered central nociceptive processing, leading to pain without or with minimal nociceptive input arising from peripheral tissues.      , MD  Fellow Chronic Pain   Department of Anesthesiology and Pain Medicine [facility]

## 2020-03-29 ENCOUNTER — Ambulatory Visit: Payer: Medicare PPO | Attending: Anesthesiology | Admitting: Anesthesiology

## 2020-03-29 ENCOUNTER — Encounter (HOSPITAL_BASED_OUTPATIENT_CLINIC_OR_DEPARTMENT_OTHER): Payer: Self-pay | Admitting: Anesthesiology

## 2020-03-29 ENCOUNTER — Encounter (HOSPITAL_BASED_OUTPATIENT_CLINIC_OR_DEPARTMENT_OTHER): Payer: Medicare PPO | Admitting: Anesthesiology

## 2020-03-29 ENCOUNTER — Other Ambulatory Visit (INDEPENDENT_AMBULATORY_CARE_PROVIDER_SITE_OTHER): Payer: Self-pay | Admitting: Internal Medicine

## 2020-03-29 ENCOUNTER — Other Ambulatory Visit (HOSPITAL_BASED_OUTPATIENT_CLINIC_OR_DEPARTMENT_OTHER): Payer: Self-pay | Admitting: Physical Medicine & Rehabilitation

## 2020-03-29 DIAGNOSIS — M47816 Spondylosis without myelopathy or radiculopathy, lumbar region: Secondary | ICD-10-CM | POA: Insufficient documentation

## 2020-03-29 DIAGNOSIS — Z8679 Personal history of other diseases of the circulatory system: Secondary | ICD-10-CM

## 2020-03-29 DIAGNOSIS — M25551 Pain in right hip: Secondary | ICD-10-CM | POA: Insufficient documentation

## 2020-03-29 DIAGNOSIS — M792 Neuralgia and neuritis, unspecified: Secondary | ICD-10-CM | POA: Insufficient documentation

## 2020-03-29 DIAGNOSIS — M7061 Trochanteric bursitis, right hip: Secondary | ICD-10-CM | POA: Insufficient documentation

## 2020-03-29 DIAGNOSIS — M545 Low back pain, unspecified: Secondary | ICD-10-CM

## 2020-03-29 DIAGNOSIS — G894 Chronic pain syndrome: Secondary | ICD-10-CM | POA: Insufficient documentation

## 2020-03-29 DIAGNOSIS — G8929 Other chronic pain: Secondary | ICD-10-CM | POA: Insufficient documentation

## 2020-03-29 DIAGNOSIS — M5410 Radiculopathy, site unspecified: Secondary | ICD-10-CM | POA: Insufficient documentation

## 2020-03-29 DIAGNOSIS — I10 Essential (primary) hypertension: Secondary | ICD-10-CM

## 2020-03-29 DIAGNOSIS — M7918 Myalgia, other site: Secondary | ICD-10-CM | POA: Insufficient documentation

## 2020-03-29 DIAGNOSIS — M48061 Spinal stenosis, lumbar region without neurogenic claudication: Secondary | ICD-10-CM | POA: Insufficient documentation

## 2020-03-29 NOTE — Patient Instructions (Addendum)
1. 1 Referall  Dr. [name] for surgery evaluation of right hip    1. 2 Dr Marisa Sprinkles in regard to Meloxicam risks and benefits    2. Increase Pregabalin.    200mg  TID a, 300 mg  at night for one week, then 250mg  AM/300 mg nightly, then 300 mg  BID    3. Understanding pain is key to improvement.    Consider looking at the following sources to better understand the origins of pain and how our emotions and mood can affect our pain experience:    Videos (Available on YouTube):  "Understanding Pain in less than 5 minutes, and what to do about it!"  ( )  "TEDxAd[place] - - Why Things Hurt" (CompPlans.co.za)  "Getting a grip on pain and the brain:Lorimer Frederick Peers"  (WrestlingReporter.dk)    Consider renting this documentary about chronic pain. The movie follows several patients experiencing chronic pain and their treatment.  Alferd Apa    The film website also has links to various free resources that you may find helpful:  SelfDetection.tn    The following website has excellent explanations about causes and treatment of pain:  FurnitureCollector.no    Many patients benefit from a biopsychosocial approach to pain treatment including physical, psychological, and emotional therapies. If you may be interested in this type of treatment, please let ExShows.dk help help find what might be the best fit for you. This could include joining group sessions, referral to pain psychology for individual sessions, or helping you get started with an app such as that available through ProtectionPoker.at for those with barriers such as insurance coverage, work schedule, remote location, etc. We periodically offer group sessions through the [facility] with a physician with expertise in the mind-body connection and group Zoom sessions with our pain psychologist and [place]  physical therapists. We look forward to supporting you in your journey.

## 2020-03-29 NOTE — Telephone Encounter (Signed)
Medication has been discontinued, will cancel refill request.    Closing TE.    [name], RN, BSN  [facility] at [employer]

## 2020-03-29 NOTE — Progress Notes (Signed)
I saw and evaluated the patient with Dr. Chapman Fitch, who conducted the initial history.  I reviewed and confirmed the history in detail with Mr. Barfoot in person.   I was present for the examination and formulation portions of the encounter. I agree with the findings and the plan of care as documented in our notes. I did edit the trainee's note.    I spent a total of 60 minutes for the patient's care on the date of the service.          [name], MD  Assistant Professor  [facility]

## 2020-03-29 NOTE — Progress Notes (Signed)
Review of Systems   Constitutional: Negative.    HENT: Negative.    Eyes: Negative.    Respiratory: Negative.    Cardiovascular: Negative.    Gastrointestinal: Negative.    Endocrine: Negative.    Genitourinary: Positive for difficulty urinating, dysuria and enuresis.   Musculoskeletal: Negative.    Allergic/Immunologic: Negative.    Neurological: Positive for weakness and numbness.   Hematological: Negative.    Psychiatric/Behavioral: Negative.

## 2020-04-01 ENCOUNTER — Encounter (HOSPITAL_BASED_OUTPATIENT_CLINIC_OR_DEPARTMENT_OTHER): Payer: Self-pay

## 2020-04-01 MED ORDER — AMLODIPINE BESYLATE 5 MG OR TABS
ORAL_TABLET | ORAL | 3 refills | Status: DC
Start: 2020-04-01 — End: 2020-12-26

## 2020-04-04 ENCOUNTER — Encounter (HOSPITAL_BASED_OUTPATIENT_CLINIC_OR_DEPARTMENT_OTHER): Payer: Medicare PPO | Admitting: Anesthesiology

## 2020-04-10 NOTE — Progress Notes (Deleted)
CC: Follow up Right hip pain    HPI:  Last seen by Dr. [name] on 07/22/18 for bilateral hip pain. Has also been seen for lower back pain in the past. Returns to clinic today for hip/groin pain that is getting worse. Here to discuss injection vs surgical referral. Had MRI R hip in 2018 which showed severe OA and extensive labral tear. Has trialed greater trochanter injection in 2019 (relief?). Has also had ESI in the past as well. Last saw ortho in 2018 and decided to defer hip replacement at that time. Due to several steroid injections in the past to multiple joints, opted to try to avoid future injections if possible though fluoroscopic-guided injection to the hip was also discussed if a course of meloxicam was not effective.    Past medical, past surgical, social, family hx: Reviewed with patient and is: Unchanged since last visit    Physical Exam  Vital signs: There were no vitals taken for this visit.  HEENT: {BCLHEENT:111888}  Pulm: {BCLPULM:111889::"Non-labored respirations"}  Cardiovasc: {BCLCardiovascular:111894}  Skin: {BCLSKIN:111895}  Neuro: {BCLNEUROEXAM:111893}  MSK: ***    Imaging/Diagnostic studies:    Impression:    Discussion/Plan:

## 2020-04-11 ENCOUNTER — Ambulatory Visit (HOSPITAL_BASED_OUTPATIENT_CLINIC_OR_DEPARTMENT_OTHER): Payer: Medicare PPO | Admitting: Physical Medicine & Rehabilitation

## 2020-04-11 ENCOUNTER — Ambulatory Visit
Admission: RE | Admit: 2020-04-11 | Discharge: 2020-04-11 | Disposition: A | Discharge status: Home / Self Care | Payer: Medicare PPO | Source: Ambulatory Visit | Attending: Diagnostic Radiology | Admitting: Diagnostic Radiology

## 2020-04-11 ENCOUNTER — Ambulatory Visit: Payer: Medicare PPO | Attending: Psychiatry

## 2020-04-11 ENCOUNTER — Encounter (HOSPITAL_BASED_OUTPATIENT_CLINIC_OR_DEPARTMENT_OTHER): Payer: Self-pay | Admitting: Physical Medicine & Rehabilitation

## 2020-04-11 VITALS — BP 141/80 | HR 84 | Temp 97.7°F | Wt 239.6 lb

## 2020-04-11 DIAGNOSIS — F1010 Alcohol abuse, uncomplicated: Secondary | ICD-10-CM | POA: Insufficient documentation

## 2020-04-11 DIAGNOSIS — F1020 Alcohol dependence, uncomplicated: Secondary | ICD-10-CM | POA: Insufficient documentation

## 2020-04-11 DIAGNOSIS — M1611 Unilateral primary osteoarthritis, right hip: Secondary | ICD-10-CM | POA: Insufficient documentation

## 2020-04-11 DIAGNOSIS — M25551 Pain in right hip: Secondary | ICD-10-CM

## 2020-04-11 NOTE — Progress Notes (Signed)
04/11/20    Ronnie Mason is a 69 year old male here for his initial naltrexone XR injection.    Ronnie Mason has a hx of AUD, has been taking oral naltrexone for several months and is here to transition to naltrexone xr injections.  He has not missed any doses of his oral naltrexone, denies alcohol or opiate use.     Med education provide, [name]'s questions were answered.  He was provided with a patient safety card and instructed to carry it with him and present to medical personnel should he need treatment for pain.    380mg  Naltrexone XR administered to L ventrogluteal.  Pt tolerated well with no ill effects.  He will monitor the injection site for s/sx of infection.  will return q 4 weeks for further medication administrations.

## 2020-04-11 NOTE — Progress Notes (Signed)
CC: Follow up RIGHT hip pain.   HPI:  Ronnie Mason is a 69 year old man I last saw in clinic on 07-22-18 for right greater than left hip osteoarthritis. We started with a course of oral meloxicam as well as acetaminophen and had discussed the possibility of a fluoroscopically guided hip joint injection. We also had a very thorough discussion about his chronic pain management and I had recommended that he follow-up with Dr. Amada Jupiter given his central sensitization of pain and consolidate his interventional procedure/care for his back with one pain physician.     Since that visit, he has followed up with Dr.[name]  Dr. Dawayne Cirri has been referral to [place] today to rediscuss his right hip osteoarthritis and any potential interventions versus surgical referral.  It should be remembered, that he saw Dr. Memory Argue on 04-01-17 for his right hip and x-rays and an MRI showed advanced osteoarthritis.  Decision was made to continue to optimize nonsurgical management    Pain is in the right hip, groin and can radiate into the right anterior thigh  Increased difficulty walking  Using topical diclofenac gel and lidocaine patches.     Past medical, past surgical, social, family hx: Reviewed with patient and is: As above in HPI    Physical Exam  Vital signs: There were no vitals taken for this visit.  HEENT: Normocephalic  Pulm: Non-labored respirations  Cardiovasc: No clubbing, cyanosis, edema  Skin: No lesions in areas examined  Neuro: Strength is 5/5 lower extremities  MSK: Significant restrictions in passive and active range of motion of his right hip.  No tenderness over the greater trochanter.    Imaging/Diagnostic studies:  XR RIGHT hip 04-11-20  -Worsening narrowing of the right hip joint consistent with worsening osteoarthritis      Past procedures for the right hip area:  April 2019 performed an ultrasound-guided greater trochanteric bursa steroid injection.    Impression:  69 y/o bilateral hip pain RIGHT > LEFT c/w hip  osteoarthritis    Discussion/Plan:   - Reviewed updated XRs as above showing worsening right hip osteoarthritis.  -Clinically symptoms are consistent with OA and I do not feel that there is any major component of a "bursitis" in the lateral hip.  If anything, this might be secondary but the main driver of pain seems to be his hip arthritis  - Discussed CSI vs. Surgical referral since he has done PT   - Plan for RIGHT fluoro guided hip joint steroid injection   - Follow up 2 weeks after injection. If not better, could make formal referral back to Dr. Madaline Guthrie for consideration of hip replacement. However, since he is already and established patient of Dr. Gayla Medicus I recommended that he call their office to see if he can make an appointment in the meantime to get things rolling.    Park Liter, MD, CAQ Sports Medicine, [facility]  Clinical Assistant Professor  Attending Physician, Department of Rehabilitation Medicine  [facility]  Division of  Sports, Spine and  Orthopedic Health  [facility]

## 2020-04-18 ENCOUNTER — Encounter (HOSPITAL_BASED_OUTPATIENT_CLINIC_OR_DEPARTMENT_OTHER): Payer: Self-pay

## 2020-04-23 ENCOUNTER — Other Ambulatory Visit: Payer: Self-pay

## 2020-04-23 NOTE — Progress Notes (Signed)
ATTENDING PROCEDURE ADDENDUM / ATTESTATION  The above procedure  RIGHT hip joint injection with fluoro was performed by Ellin Goodie,  Sports Medicine Fellow. I was present for the entire procedure and directly supervised the procedure. I agree with the trainee's findings. I personally reviewed all available imaging and clinic notes prior to the procedure.       Park Liter, MD, CAQ Sports Medicine, [facility]  Attending Physician  Department of Rehabilitation Medicine  Division of Sports, Spine and Orthopedic Health  [facility] at [facility]  [phone], Option #2

## 2020-04-23 NOTE — Progress Notes (Signed)
[facility]        Specializing in sports-related injuries and non-surgical care for conditions of the spine, shoulder, elbow, wrist, hand, hip, knee, foot, and ankle.      04/23/2020    Ronnie Mason  [id number]    PROCEDURE:  Fluoroscopic guided right hip injection    DIAGNOSIS/IDENTIFICATION:    69yo M here for lateral approach right hip joint injection. Pt of Dr [name].      Allergies:  is allergic to adhesives; bee venom; gabapentin; and methocarbamol.  Allergies were reviewed with the patient. Contraindications: None    CONSENT:  Potential risks including pain, serious infection, paralysis, nerve injury, allergic reaction, and others were discussed with the patient. The patient was given and read a patient information sheet regarding injection procedures prior to today?s injection. The patient understands the benefits, risks, and alternatives of the procedure and agrees to the procedure today. Written informed consent was obtained.     PROCEDURE TECHNIQUE:  The patient was escorted to the procedure suite and positioned in the lateral decubitus position on the procedure table, with the right leg facing up away from the table. A brief pause occurred prior to procedure in which final verification was performed.  The greater trochanter was palpated to identify the region superior to the trochanter as the area of interest.  The skin was prepped and draped in the usual sterile fashion using chlorhexadine.  Under fluoroscopic guidance the right hip joint was located using superficial landmarks and fluoroscopy. The skin overlying the area was anesthetized with 1% lidocaine using a 25 gauge needle. Using intermittent fluoroscopic guidance, a 22 gauge 3 inch spinal needle was advanced into the hip joint using a lateral approach.  Precise needle placement was confirmed by injection of Omnipaque (iohexol) contrast dye using live fluoroscopy.  A normal arthrogram was obtained.  This was followed  by the injection of 2 cc of 1.0% lidocaine, 2 cc of 0.25% bupivacaine, and 1 cc of Kenalog (triamcinolone acetonide 40 mg/mL). The needle was then flushed with 1% lidocaine and removed, the skin was cleansed, and a bandage placed over the injection site.  The patient was assisted to the seated position and then escorted to the post procedure recovery area in excellent condition.  The patient tolerated the procedure well.  Vital signs remained stable both pre- and post- procedure.    COMPLICATIONS:  None.    Symptoms were 8/10 initially and 8/10 during the anesthetic phase following the procedure.    POST-INJECTION PLAN:  1.  The patient was given instructions regarding the use of ice and appropriate activity level over the next few weeks.  Pt was instructed to call the office immediately if there are any questions or problems.    2.  The patient will follow-up w Dr [name] in 2 wks.     Ellin Goodie, D.O.   Sports Medicine Fellow  [facility]   Department of Rehabilitation Medicine

## 2020-04-24 ENCOUNTER — Ambulatory Visit: Payer: Medicare PPO | Attending: Physical Medicine & Rehabilitation | Admitting: Physical Medicine & Rehabilitation

## 2020-04-24 DIAGNOSIS — M25551 Pain in right hip: Secondary | ICD-10-CM | POA: Insufficient documentation

## 2020-04-24 DIAGNOSIS — M1611 Unilateral primary osteoarthritis, right hip: Secondary | ICD-10-CM | POA: Insufficient documentation

## 2020-04-24 MED ORDER — TRIAMCINOLONE ACETONIDE 40 MG/ML IJ SUSP
INTRAMUSCULAR | Status: AC
Start: 2020-04-24 — End: ?
  Filled 2020-04-24: qty 1

## 2020-04-24 MED ORDER — DEXAMETHASONE SOD PHOSPHATE PF 10 MG/ML IJ SOLN
INTRAMUSCULAR | Status: AC
Start: 2020-04-24 — End: ?
  Filled 2020-04-24: qty 2

## 2020-04-24 NOTE — Progress Notes (Signed)
Ronnie Mason identified by name and DOB.   He confirmed he is having a Right Hip Joint Injection  He ambulated to procedure area using a cane.  He has not fallen in the last 6 months.  Fall prevention interventions: Patient provided with non-skid stockings  He denies taking any blood thinners.  He denies having a bleeding disorder.  He  Has held his medications per provider instructions.  He denies a history of fainting during medical procedures.   He has not been NPO.  He undressed self without assistance.  Is He receiving a steroid injection today? Yes  If yes, has He received the COVID-19 vaccine (either brand) within the past 2 weeks? No  OR does He plan to receive the COVID-19 vaccine (either brand) within the next 2 weeks?No  I verbally reviewed written discharge instructions and pain diary with him.   Confirmed ride home with his daughter, Ronnie Mason [phone]     He has a pre-procedure pain score of 8/10 Right low back and hip

## 2020-04-24 NOTE — Progress Notes (Signed)
Pain score prior to procedure:  8/10  Pain score after procedure:  8/10    Patient was in normal sinus rhythm during the entire procedure.  [name] met discharge criteria:  A/OX4/baseline, VSS/returned to baseline, ambulatory with steady gait. Site clean dry and intact.  Written and verbal discharge instructions, including emergency contact phone numbers, reviewed with [name]. Verbal understanding obtained from him.   [name] dressed self without assistance. He was discharged in stable condition, ambulatory with steady gait to home with all belongings and with ride/responsible person.

## 2020-04-24 NOTE — Patient Instructions (Signed)
PATIENT EDUCATION for INJECTION PROCEDURES    Post-Procedure Discharge Instructions    After the procedure you might experience:  ? Immediate and complete pain relief is rare; sometimes a mild increase in pain can occur for several days after the procedure  ? Numbness and/or weakness in the area of your body supplied by the injected nerve; these symptoms should resolve but may last up to several hours  ? Some soreness and bruising at the injection site(s)    Activities:  ? If you have any weakness or numbness caused by the injection, DO NOT DRIVE or operate machinery and limit other activity until sensation returns to normal.  ? Activity should be limited to basic levels (no exercise or other heavy activity) on the day of the procedure  ? Physical therapy and higher level exercise should be delayed for three days following the procedure    Medications:  · If you stopped taking any blood thinning medications such as Coumadin or Plavix, you may resume these tomorrow unless specified differently by the prescribing physician.    Site care:  ? You may remove the band-aid after 6 hours.  ? You may shower today. No swimming, tub baths or hot tubs for 24 hours following your procedure.  ? For the first 48 hours, apply ice packs to the injection site for 15-20 minutes hourly as needed for comfort.  Wrap a light towel or cloth around ice packs and heating pad to protect the skin.  ? After 48 hours, use a warm heating pad to the injection site for 15-20 minutes hourly as needed for comfort.    If you received steroids today:  ? Steroid medications may cause facial flushing, occasional low-grade fevers, hiccups, insomnia, headaches, water retention, increased appetite, increased heart rate, and abdominal cramping or bloating. These side effects occur in only about 5 percent of patients and commonly disappear within one to three days after the injection.   ? If you are diabetic check your blood sugar more frequently than usual as you may develop an increase in blood sugar for the next 10-14 days. Contact your diabetes physician if this occurs.    Call us if you develop any of the following symptoms in the next 7 days:  ? Fever above 100 degrees F     ? Any unusual increase in your level of pain  ? Swelling, bleeding, redness, or increased tenderness at the procedure or IV site  ? Headache not relieved by Tylenol (if you had an epidural steroid injection)    Follow-up: As instructed.    Contact Us    [facility]:  [phone], option 8 to speak to the nurse    [facility]:  [phone], option 8    [facility]:   [phone]    Thank you for allowing us to participate in the management of your medical care.  Dr. Harrast, Dr. Krabak, Dr. Liem, Dr. Johnson and Dr. Sandhu                          POST-INJECTION EVALUATION    The following information needs to be completed and returned to our offices at your follow-up appointment.  You may be sore from the needles, so when rating your pain, please concentrate on your regular pain and not any soreness from the needle injection itself.          1-HOUR after the procedure, my   pain level is (place an ‘x’ along the line below):                           No Pain                                                       Worst Pain Imaginable  0 1 2 3 4 5 6 7 8 9 10        2-HOURS after the procedure, my pain level is (place an ‘x’ along the line below):                 No Pain                                                                               Worst Pain Imaginable  0 1 2 3 4 5 6 7 8 9 10        3-HOURS after the procedure, my pain level is (place an ‘x’ along the line below):      No Pain                                                         Worst Pain Imaginable  0 1 2 3 4 5 6 7 8 9 10      4-HOURS after the procedure, my pain level is (place an ‘x’ along the line below):       No Pain              Worst Pain Imaginable  0 1 2 3 4 5 6 7 8 9 10      5-HOURS after the procedure, my pain level is (place an ‘x’ along the line below):       No Pain                                    Worst Pain Imaginable  0 1 2 3 4 5 6 7 8 9 10      24-HOURS after the procedure, my pain level is (place an ‘x’ along the line below):       No Pain               Worst Pain Imaginable  0 1 2 3 4 5 6 7 8 9 10      2-DAYS after the procedure, my pain level is (place an ‘x’ along the line below):                            No Pain                                                                                                         Worst Pain Imaginable  0 1 2 3 4 5 6 7 8 9 10    3-DAYS after the procedure, my pain level is (place an ‘x’ along the line below):                No Pain                                                 Worst Pain Imaginable  0 1 2 3 4 5 6 7 8 9 10      1-WEEK after the procedure, my pain level is (place an ‘x’ along the line below):      No Pain                                                                        Worst Pain Imaginable  0 1 2 3 4 5 6 7 8 9 10      2-WEEKS after the procedure, my pain level is (place an ‘x’ along the line below):       No Pain                                               Worst Pain Imaginable  0 1 2 3 4 5 6 7 8 9 10            Thank you for your responses.    Please return this form to the Medical Assistant or your Physician.

## 2020-05-09 ENCOUNTER — Ambulatory Visit (HOSPITAL_BASED_OUTPATIENT_CLINIC_OR_DEPARTMENT_OTHER): Payer: Medicare Other | Admitting: Nursing

## 2020-05-09 ENCOUNTER — Ambulatory Visit (HOSPITAL_BASED_OUTPATIENT_CLINIC_OR_DEPARTMENT_OTHER): Payer: Medicare Other | Admitting: Addiction (Substance Use Disorder)

## 2020-05-09 ENCOUNTER — Ambulatory Visit: Payer: Medicare Other | Attending: Psychiatry

## 2020-05-09 ENCOUNTER — Ambulatory Visit (HOSPITAL_BASED_OUTPATIENT_CLINIC_OR_DEPARTMENT_OTHER): Payer: Medicare Other | Admitting: Psychiatry

## 2020-05-09 DIAGNOSIS — G894 Chronic pain syndrome: Secondary | ICD-10-CM | POA: Insufficient documentation

## 2020-05-09 DIAGNOSIS — F0150 Vascular dementia without behavioral disturbance: Secondary | ICD-10-CM | POA: Insufficient documentation

## 2020-05-09 DIAGNOSIS — Z23 Encounter for immunization: Secondary | ICD-10-CM

## 2020-05-09 DIAGNOSIS — F1020 Alcohol dependence, uncomplicated: Secondary | ICD-10-CM

## 2020-05-09 DIAGNOSIS — F01A0 Vascular dementia, mild, without behavioral disturbance, psychotic disturbance, mood disturbance, and anxiety: Secondary | ICD-10-CM

## 2020-05-09 DIAGNOSIS — F1010 Alcohol abuse, uncomplicated: Secondary | ICD-10-CM | POA: Insufficient documentation

## 2020-05-09 MED ORDER — INFLUENZA VAC SUBUNIT QUAD 0.5 ML IM SUSY
PREFILLED_SYRINGE | INTRAMUSCULAR | Status: AC
Start: 2020-05-09 — End: ?
  Filled 2020-05-09: qty 0.5

## 2020-05-09 NOTE — Progress Notes (Signed)
[facility]  1:1 Session Note    Problem #: 1,2,3,4   Time: 30 Minutes  Notes: D) Patient said the vivitrol shot is doing him well. He reported that he is drinknig 5x week 1-2 glasses of wine per time. He said that he and his wife have agreed not to drink on Tuesdays and Thursdays. He said that he would like to further cut down his drinking eventually. We discussed strategies for that. Patient reported that he is physically feeling better but still cant get around as well as he would like. Patient is seeing the psychiatrist and getting the vivitrol shot today.  A) Patient appeared open and honest during this session. P) Attend all meetings/appointments.              [name] M.A. CDP

## 2020-05-09 NOTE — Progress Notes (Signed)
05/09/20    Ronnie Mason is a 69 year old male here for his monthly naltrexone XR injection.    380mg  Naltrexone XR administered to R ventrogluteal.  Pt tolerated well with no ill effects.  He will monitor the injection site for s/sx of infection.  will return q 4 weeks for further medication administrations.

## 2020-05-09 NOTE — Progress Notes (Signed)
[facility] ADDICTION CLINIC NOTE    DATE: 05/09/20    LOCATION OF SERVICE: In-clinic Visit   Duration  E&M (Mins): 45 minutes   Add on Psychotherapy:   Duration (Mins): 45 minutes  Type of Psychotherapy/Interventions used: Motivational Enhancement Therapy & Harm Reduction Therapy, elements of CBT for SUDs/chronic pain.     ID: Ronnie Mason is a 69 year old male who is here today for an Addiction Psychiatry appointment.     PROBLEM LIST:  Patient Active Problem List    Diagnosis Date Noted    Syncope     Hypotension     Carotid Sinus Hypersensitivity     URI (upper respiratory infection)     Macrocytosis without anaemia 02/12/2020    Myofascial pain 10/20/2019    MRSA (methicillin resistant staph aureus) culture positive 07/13/2019    Alcoholism in remission (HCC) 03/16/2019     03/16/2019        Closed fracture of one rib of right side 01/26/2019     See x ray 01/26/2019   Right anterior lat sub acute chronic       Enuresis 09/06/2018    Chronic left-sided low back pain without sciatica 06/21/2018    Acute left-sided low back pain without sciatica 03/14/2018    Greater trochanteric pain syndrome of right lower extremity 11/25/2017    Complex tear of medial meniscus of left knee as current injury 09/08/2017    Primary osteoarthritis of left knee 09/08/2017    Right lumbar radiculitis 05/05/2017    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    Primary osteoarthritis of right hip 01/11/2017     Osteoarthritis of right hip x ray report - moderate to advanced radiology read 01/11/2017      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(HCC) 08/06/2016    Impaired mobility and ADLs 04/08/2016    Difficulty in walking, not elsewhere classified 03/12/2016    Vitamin D deficiency 02/13/2016    Balance problem 01/23/2016    Chronic pain of both shoulders 01/23/2016     X ray arthritis  02/19/2016        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) 01/15/2016    Intraparenchymal hemorrhage of brain (HCC) 01/06/2016     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    Essential hypertension 12/16/2015    Essential tremor 12/16/2015    Hx of spontan intraparenchymal intracran bleed assoc with hypertension 12/16/2015     [place] 4/29- 12/10/2015  Non-traumatic intraparenchymal hemorrhage, hypertensive of the left thalamus   Traumatic hemorrhage   -- falcine subdural hemorrhage, small cortical subarachnoid      Alcohol abuse 12/16/2015    Cerebral ventriculomegaly 10/09/2015    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    History of alcohol dependence (HCC) 06/26/2015    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) 05/07/2015    Idiopathic peripheral neuropathy 03/15/2015    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 myelopathy or radiculopathy 05/28/2014    Sensory neuropathy 01/18/2014    Tremor 04/20/2013    Fall 02/23/2013     BP < 100  Occasioanal tri[p and leg weakness      Back pain, lumbosacral 02/15/2013    Lumbar radiculopathy, chronic 02/15/2013    Neck pain 02/15/2013    Chronic pain 12/21/2012     Now at [place], DO, Santiago Glad    Neck pain -burn c3-7 on left side per patient  Some trigger injection    Lumbar pain-since 80's better with exercise  Bee sting allergy 12/21/2012     hives      Numbness and tingling of leg 12/21/2012     Left calf -left foot long term suspect from back-suspect L 45      Chronic back pain 12/21/2012     Mri in mid scape 1/214  Multilevel degenerative disc disease of the lumbar spine. Circumferential disc bulge at all levels below and including L1-2. No significant central spinal canal stenosis or neuroforaminal narrowing.  2. Mild retrolisthesis of L5 on S1.            B12 deficiency 12/21/2012     Borderline low 5 /21/2014      Chronic renal insufficiency, stage III (moderate) (HCC) 12/21/2012     5/ 2013      Hyperlipidemia 09/26/2012    Cervicalgia 03/11/2010     Radiofrequency ablation of c3 to c 7          CHIEF  COMPLAINT: AUD     INTERVAL HISTORY:  Ronnie Mason presents to clinic on time, in NAD, for a scheduled follow-up appnt. He reports the following interval hx:    SUDs:  -AUD:   ~[name] and wife have agreed to not drink 2 days per week (initially one day, now adding another); other days will have 2-3 glasses wine with dinner (not more than a bottle btwn themt; reports no use of liquor in past 2-27mo. States that while he appreciates risks of continued use, his goal is controlled drinking at this time. Is aware of NIAAA recommendations (given age and sex, </3drk/24hr, </=7/wk), which he still exceeds. Reviewed strategies for limiting access to use (e.g., only at dinner, having only enough wine in kitchen/dining area to meet couples drinking max goals, designating sober days, etc.)   - RXs: switched to XR-NTX beginning 04/11/20 (2nd inj today); Ronnie Mason has titrated pregabalin to /day.  Pt has stopped liquor use and decreased wine use (though this remains above intended target); wife drinks as well but is helping w/structure and monitoring. No recent reported falls, no w/d symptoms.     Psychiatric: has denied any significant history of psychiatric symptoms (depression, anxiety, mania/hypomania, PD, delusional thought content, ADHD); did suffer IPH several years ago with apparent neurocognitive deficits thereafter. Duloxetine previously titrated to /day (no SEs but also does not report significant perceived benefit (for pain.))    Somatic/General Medical:   -chronic pain and numbness in bilat lower extremities (no other pain in shoulders, hips, neck), benefits from episodic joint injections, cared for by Ronnie Mason at the [facility] pain clinic; history of multiple GLFs (through first half of 2021, none in interval period), sometimes in the context of drinking though he cites LE numbness and poor balance is being significant contributing factors.  Denies new or significant headaches, dizziness, vision changes, S OB, CP,  abdominal pain or other GI distress, recent changes in urination, new numbness/tingling/paresthesias (states normal sensation in his upper extremities is preserved)    Psychosocial: Stably housed with wife, has supportive children; retired without significant financial concerns. Freq trips to house on [place].    Reviewed indications, relative risks & benefits, and alternatives to treatment options as well as relevant aspects of pharmacology and reviewed key elements of and expectation regarding the addictions program. Pt was given opportunities to ask and have questions answered and agrees with the treatment plan outlined below.    SUBSTANCE USE HISTORY:       -- Opioid use history: Denies      --  Alcohol: began drinking 8th grade, progressed in frequency and quantity of through college but was not function-impairing; drink wine throughout her adult life (usually with dinner, socialization), however several years ago this progressed to a fifth of burbon/day several years ago (patient states that when opioids for pain were rapidly tapered, he "turned to alcohol" to manage pain and has since lost control over use), which, following a significant fall and CNS injury, he switch to wine, drinking approximately 2 bottles/day about a year ago, with more recent efforts to cut down again (in the context of support/encouragement from family), leading to a 1 week period of sobriety in May 2021, with subsequent return to drinking of 2 glasses with dinner (now, increased up to 1 bottle nightly by 02/2020).  Patient indicates that has been "the extent of it" though his wife indicates that she has been finding bottles of wine/bourbon/whiskey/vodka from time to time in and around the house as recently as 01/2020 (the patient at times seems surprised are unaware of this, though also indicating that earlier in the spring when his wife was gone for family matters he would walk to the store by alcohol regularly.).  He states he is  not until recently undergone any formal treatment involvement commonly either psychosocial or pharmacologic until 6/21 (began NTX).  He states he has never struggled with withdrawal although in late spring 2021 back he did note an increase in his essential tremor and some nausea that may have been associated with cessation of [facility]      -- Sedative-Hypnotic: Denies      -- Stimulants: Denies      -- MDMA: denies      -- Inhalants: denies       -- Hallicinogens: denies      -- PCP: denies      -- Cannabis/cannabinoids: Denies      -- Tobacco: Denies    PSYCHIATRIC HISTORY:  -Diagnoses: neurocognitive disorder (likely vascular etiology)  -Hospitalizations: Denies  -ITAs: Denies  -Suicide Attempts: Denies  -Outpt Treatment Hx: Denies  -Previous Psychiatric Meds: Memantine  -Current Psychiatric Meds: Duloxetine 120mg /day (for pain), pregabalin (for pain)    PAST MEDICAL HISTORY:  -Chronic shoulder/hip/back/neck pain, lumbar radiculopathy and lower extremity peripheral neuropathy, hyperlipidemia, HTN, 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, history of GLFs, essential tremor, and history of vitamin D and B12 deficiencies      SOCIAL HISTORY:  Living situation: House independently but wife  Marital status: Married, adult children  Social supports: family  Education: Geographical information systems officerCollege degree  Employment history: Technical sales engineerArchitect, now retired  Armed forces operational officerLegal: No issues reported    FAMILY HISTORY:  SUDS: severe AUD in both mat/pat lines  Psych: brother with depression who died by suicide    MEDICATIONS:  Outpatient Medications Prior to Visit   Medication Sig Dispense Refill    Acetaminophen 500 MG Oral Tab 2 tablets bid      Alpha-Lipoic Acid 300 MG tablet Take 1 tablet by mouth daily.      amLODIPine 5 MG tablet TAKE ONE TABLET BY MOUTH TWICE DAILY 180 tablet 3    atorvastatin 10 MG Oral Tablet Take 1 tablet (10 mg) by mouth daily. 90 tablet 2    BENFOTIAMINE OR Take 250 mg by mouth daily.       Cholecalciferol (VITAMIN D3) 2000 units Oral Cap Take 1 capsule (2,000 Units) by mouth daily. For low level 1 capsule 1    Cyanocobalamin 1000 MCG Oral  Tab one per day over-the-counter started 5/21 /2012 this level borderline low 1 Tab 1    diclofenac 1 % gel Apply 4 g topically 4 times a day. Apply to 2 gram four times daily to neck, trapezius muscle, and low back. Use a max of 32 grams a day. 300 g 4    diclofenac 1 % gel       DULoxetine 60 MG DR capsule Take 1 capsule (60 mg) by mouth 2 times a day. 180 capsule 1    EPINEPHrine 0.3 MG/0.3ML auto-injector Inject as instructed per patient package insert, 0.3 mg intramuscularly or subcutaneously into the thigh, if needed to treat anaphylaxis 4 each 1    IBUPROFEN OR Take by mouth.      lidocaine 5 % patch Apply 1 patch onto the skin daily. Apply to painful area for up to 12 hours in a 24 hour period. 30 patch 10    lisinopril 20 MG tablet Take 1 tablet (20 mg) by mouth every 12 hours. 180 tablet 3    meloxicam 7.5 MG tablet Take 1 tablet (7.5 mg) by mouth daily as needed. For pain/inflammation. Take with food. (Patient not taking: Reported on 04/24/2020) 10 tablet 0    naltrexone 380 MG injection Inject 380 mg intramuscularly every 28 days.      naltrexone 50 MG tablet Take 1.5 tablets daily. (Patient not taking: Reported on 04/24/2020) 45 tablet 4    pregabalin 100 MG capsule TAKE ONE CAPSULE BY MOUTH THREE TIMES A DAY (Patient taking differently: 300 mg. 300 mg twice a day) 270 capsule 1    pregabalin 50 MG capsule TAKE ONE CAPSULE BY MOUTH THREE TIMES A DAY 270 capsule 1    primidone 50 MG tablet TAKE ONE TABLET BY MOUTH TWICE DAILY 180 tablet 1    solifenacin 5 MG tablet TAKE ONE TABLET BY MOUTH ONCE DAILY (Patient not taking: Reported on 02/22/2020) 90 tablet 0    thiamine 100 MG tablet Take 1 tablet by mouth daily.       triamcinolone 0.1 % cream As needed to rear bid x 1 week 15 g 1     Facility-Administered Medications Prior to Visit    Medication Dose Route Frequency Provider Last Rate Last Admin    naltrexone (Vivitrol) injection 380 mg  380 mg Intramuscular q28 days Rockne Menghini, MD   380 mg at 04/11/20 1011       ALLERGIES:  Review of patient's allergies indicates:  Allergies   Allergen Reactions    Adhesives Skin: Rash    Bee Venom Skin: Hives, Skin: Itching and Swelling    Gabapentin      Flu like symptoms, diarrhea, body ache upset stomach    Methocarbamol Other     Patient's wife reports cognitive difficulties ("goofy")     MEDICAL REVIEW OF SYMPTOMS:  Other than in the HPI, all other systems are negative.    PHYSICAL EXAM:  There were no vitals taken for this visit.    -GENERAL: in no acute distress, non-diaphoretic   -HEENT: sclera/conjunctiva clear, PERRLA, no significant bruising or excoriations  -RESPIRATORY: non-labored breathing  -EXTREMITIES: No visible deformities, edema (albeit limited physical exam)  -SKIN : No skin abscesses, lesions, rashes, ulcers on exposed skin  -NEUROLOGICAL: Bilateral UE tremor (with reported chronic essential tremor), antalgic and slightly wider set gait.      MENTAL STATUS EXAM:  General appearance: appropriately groomed and casually but neatly dressed  Behavior/activity: appropriate and reserved but  cooperative  Speech: generally normal rate and prosody  Affect: constricted  Mood: euthymic and though some frustration with pain management  Thought Form: More linear, goal-focused today  Thought Content: No SI/HI and No AH or VH  Orientation: person, place and situation  Attention/concentration: attentive  Memory: impaired  Insight and judgment: some insight  Additional comments/details:     LAB RESULTS:  Results for orders placed or performed in visit on 02/27/20   CBC with Diff   Result Value Ref Range    WBC 7.12 4.3 - 10.0 10*3/uL    RBC 4.15 (L) 4.40 - 5.60 10*6/uL    Hemoglobin 14.6 13.0 - 18.0 g/dL    Hematocrit 44 38 - 50 %    MCV 105 (H) 81 - 98 fL    MCH 35.2 (H) 27.3 -  33.6 pg    MCHC 33.6 32.2 - 36.5 g/dL    Platelet Count 161 096 - 400 10*3/uL    RDW-CV 12.7 11.6 - 14.4 %    % Neutrophils 65 %    % Lymphocytes 20 %    % Monocytes 11 %    % Eosinophils 3 %    % Basophils 1 %    % Immature Granulocytes 0 %    Neutrophils 4.64 1.80 - 7.00 10*3/uL    Absolute Lymphocyte Count 1.43 1.00 - 4.80 10*3/uL    Monocytes 0.76 0.00 - 0.80 10*3/uL    Absolute Eosinophil Count 0.22 0.00 - 0.50 10*3/uL    Basophils 0.05 0.00 - 0.20 10*3/uL    Immature Granulocytes 0.02 0.00 - 0.05 10*3/uL    Nucleated RBC 0.00 0.00 10*3/uL    % Nucleated RBC 0 %   Pathologist Review   Result Value Ref Range    Pathologist Review Macrocytosis. SEERPT       IMAGING/EKG/OTHER STUDIES REVIEWED:  EKG 12-LEAD  Order: [id number]  Status:  Final result Visible to patient:  Yes (MyChart) Next appt:  04/04/2020 at 10:00 AM in Pain Management Renea Ee, MD)   Component   Ref Range & Units 02/09/19 1556    Ventricular Rate   BPM 87     Atrial Rate   BPM 87     P-R Interval   ms 162     QRS Duration   ms 70     Q-T Interval   ms 368     QTC Calculation   ms 442     P Axis   degrees 15     R Axis   degrees 31     T Axis   degrees 32     Diagnosis  Normal sinus rhythm   Normal ECG   When compared with ECG of 12-Oct-2017 10:58,   No significant change was found   Confirmed by DODGE M.D., STEPHEN [phone]) on 02/10/2019 11:29:37 AM          Specimen Collected: 02/09/19 15:56 Last Resulted: 02/10/19 11:29               PRESCRIPTION MONITORING PROGRAM RESULTS:  Medication Dispense History (from 05/10/2019 to 05/09/2020)     Dispensed Written Strength Quantity Refills Days Supply Provider Pharmacy   PREGABALIN 100 MG CAPSULE 03/29/2020 12/14/2019  270 unspecified  90 KRAFT, [facility][id number]   PREGABALIN 50 MG CAPSULE 03/29/2020 12/14/2019  270 unspecified  90 [facility][id number]   PREGABALIN 50 MG CAPSULE 12/21/2019 12/14/2019  270 unspecified  90 [facility], [facility] [phone]  PREGABALIN 100  MG CAPSULE 12/20/2019 12/14/2019  270 unspecified  90 [facility][id number]   PREGABALIN 100 MG CAPSULE 11/06/2019 05/23/2019  90 unspecified  30 [facility] #[id number]   PREGABALIN 100 MG CAPSULE 09/30/2019 05/23/2019  90 unspecified  30 [facility], [facility] #[id number]   PREGABALIN 50 MG CAPSULE 09/30/2019 05/23/2019  180 unspecified  60 [facility], [facility] #[id number]   PREGABALIN 100 MG CAPSULE 08/26/2019 05/23/2019  90 unspecified  30 [id number]   PREGABALIN 50 MG CAPSULE 07/20/2019 05/23/2019  180 unspecified  60 KRAFT, [id number]          SUICIDE SAFETY ASSESSMENT:  Suicide Ideation Risk Category: Low: No SI, or, only passive death wishes    Risk factors include: substance use disorder    Suicide Ideation protective factors: hope for future, responsibility to others, well engaged in treatment, high spirituality or belief that suicide is immoral, protective social network and willingness to follow crisis plan    OVERALL SUICIDE ASSESSMENT:     At present, pt is estimated to be at low acute risk for harm to self or others (though pts baseline risk, relative to the general population, is increased) based on the balance of the following risk/protective factors: age, gender, race/ethnicity, sexual orientation, degree of social isolation/support, current psychiatric symptom-burden, SU hx, hx of SI/self-harm, current access to means, estimated personality structure, relative severity of comorbid medical issues/pain, other acute on chronic psychosocial stressors, engagement in treatment. Pt should, however, be monitored for significant changes in contextual and intra-personal factors that could enhance risk (e.g., major losses, worsening psychiatric symptom burden, relapse/escalating SU.)    ASSESSMENT:     Pt is a married, house, retired 69 year old male with history of AUD, neurocognitive disorder with prior CVA, chronic pain and other  significant medical history here for an Addiction Psychiatry appointment.  Based on available data, pt meets criteria for the SUDs, general psychiatric, and other medical conditions listed below (under diagnoses).  This includes meeting criteria for alcohol use disorder, severe. Time has been spent with patient (and wife) discussing diagnosis, etiology, potential  treatment options (psychosocial and pharmacologic), with opportunities to ask and have all questions answered. The treatment plan, below, was developed in a collaborative manner.  Mr. Casella is aware of risks of continued etoh use as well as NIAAA recommended limits; at this time, however, he is interested in controlled drinking with assistance of naltrexone and gabapentinoid (pregabalin), which may be a synergistic combination for alcohol cessation/controlled use. In the interval period, he had experienced some success in avoiding liquor and in decreasing his average use quantity (wine exclusively) 2-3 glasses wine 5 nights/wk (now w/structured 2/7 days abstinence), moving towards his stated goal of no more than 1.5 glasses of wine on 4-5 nights/wk. Discussed behavioral/structual interventions as well as pharmacotherapy (instructed on proper administration of medications, including the potential side effects) and verbalized understanding of the information provided, successfully switched NTX from PO to IM formulation (beginning 04/11/20) in order support compliance, while continuing pregabalin at current dose.  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, history of GLFs, essential tremor, and history of vitamin D and B12 deficiencies    PLAN:     ## Alcohol Use DO:  -No indication of need for withdrawal management (and based on history provided in terms of prior withdrawal experience and current use, would appear to be at low risk for future severe withdrawal in the setting of cessation)  - c/w thiamine  qday (while alcohol use continues)  - c/w (per Ronnie Mason) pregabalin 200 mg TID  - c/w XR-NTX  IM Q4wk (last 05/09/20)  - Psychosocial: encourage cont participation in psychotherapy for SUDs w/Mike, further involvement in IOP, AA/NA; residential tx may be an option if outpt treatment is ineffective.  - Pt to present to ED/hospital with emergence of any significant w/d sxs, confusion, seizure or hallucinations    # OTHER:  -Patient to continue care with other providers (including pain clinic, PCP)  -Continue to work on reduction/mitigation of fall risk  -Consider recheck of B12, vitamin D given history of deficiencies (w/repletion and macrocytosis -- though may be etoh-related -- as indicated)    # SAFETY:  - no indication of acute medical concern or acute risk of harm to self or others  - patient instructed to present to clinic/MH provider, the local Emergency Department, dial 911, and/or call the crisis line (1-866-4CRISIS, or [phone]) should mental health status worsen or require immediate attention.     # FOLLOW-UP:  -RTC from XR-NTX inj in Nov, then with this provider in ~8 weeks for follow-up.  -Pt to f/u with his addiction clinic team, is encouraged to attend groups, per pt's established program.  -Pt to f/u with primary care, pain clinic, other specialty medical care, as needed.

## 2020-05-10 MED ORDER — INFLUENZA VAC SUBUNIT QUAD 0.5 ML IM SUSY
0.5000 mL | PREFILLED_SYRINGE | Freq: Once | INTRAMUSCULAR | Status: AC
Start: 2020-05-09 — End: 2020-05-09
  Administered 2020-05-09: 0.5 mL via INTRAMUSCULAR

## 2020-05-10 NOTE — Progress Notes (Signed)
Vaccine Screening Questions    Interpreter: No    1. Are you allergic to Latex?   NO      2.  Have you had a serious reaction or an allergic reaction to a vaccine?  NO    3.  Currently have a moderate or severe illness, including fever?  NO    4.  Ever had a seizure or any neurological problem associated with a vaccine? (DTaP/TDaP/DTP pertinent) NO    5.  Is patient receiving any live vaccinations today? (Varicella-Chickenpox, MMR-Measles/Mumps/Rubella, Zoster-Shingles, Flumist, Yellow Fever) NOTE: oral rotavirus is exempt  NO    If YES to any of the questions above - Do NOT give vaccine.  Consult with RN or provider in clinic.  (#5 can be YES if all Live vaccine questions are answered NO)    If NO to all questions above - Patient may receive vaccine.    6. Do you need to receive the Flu vaccine today? YES - Additional Flu Questions  Flu Vaccine Screening Questions:    Ever had a serious allergic reaction to eggs?  NO    Ever had Guillain-Barre syndrome associated with a vaccine? NO    Less than 6 months old? NO    If YES to any of the Flu questions above - NO Flu Vaccine to be given.  Patient may consult provider as needed.    If NO to all questions above - Patient may receive Flu Shot (IM)    Is the patient requesting Flumist? NO    If between 6 months and 58 years of age, was flu vaccine received last year?  N/A  If NO to above question:  . Children who are receiving influenza vaccine for the first time - administer 2 doses of the current influenza vaccine (separated by at least 4 weeks).          All patients are encouraged to wait 15 minutes before leaving after receiving any vaccine.    VIS given 05/09/2020 by Bridgett Larsson, RN.    Administered in L deltoid

## 2020-05-31 ENCOUNTER — Ambulatory Visit (INDEPENDENT_AMBULATORY_CARE_PROVIDER_SITE_OTHER): Payer: Medicare PPO

## 2020-06-06 ENCOUNTER — Ambulatory Visit (HOSPITAL_COMMUNITY): Payer: Self-pay

## 2020-06-06 ENCOUNTER — Other Ambulatory Visit (HOSPITAL_BASED_OUTPATIENT_CLINIC_OR_DEPARTMENT_OTHER): Payer: Self-pay | Admitting: Psychiatry

## 2020-06-06 ENCOUNTER — Other Ambulatory Visit (HOSPITAL_BASED_OUTPATIENT_CLINIC_OR_DEPARTMENT_OTHER): Payer: Self-pay

## 2020-06-06 ENCOUNTER — Ambulatory Visit (HOSPITAL_BASED_OUTPATIENT_CLINIC_OR_DEPARTMENT_OTHER): Payer: Medicare PPO | Admitting: Addiction (Substance Use Disorder)

## 2020-06-06 ENCOUNTER — Ambulatory Visit: Payer: Medicare PPO | Attending: Psychiatry

## 2020-06-06 DIAGNOSIS — F1020 Alcohol dependence, uncomplicated: Secondary | ICD-10-CM

## 2020-06-06 DIAGNOSIS — F1010 Alcohol abuse, uncomplicated: Secondary | ICD-10-CM

## 2020-06-06 LAB — HEPATIC FUNCTION PANEL
ALT (GPT): 21 U/L (ref 10–48)
AST (GOT): 21 U/L (ref 9–38)
Albumin: 4.5 g/dL (ref 3.5–5.2)
Alkaline Phosphatase (Total): 93 U/L (ref 36–161)
Bilirubin (Direct): 0.1 mg/dL (ref 0.0–0.3)
Bilirubin (Total): 0.6 mg/dL (ref 0.2–1.3)
Protein (Total): 6.8 g/dL (ref 6.0–8.2)

## 2020-06-06 MED ORDER — NALTREXONE 380 MG IM SUSR
380.0000 mg | INTRAMUSCULAR | Status: DC
Start: 2020-06-27 — End: 2021-02-05
  Administered 2020-06-26 – 2020-12-18 (×8): 380 mg via INTRAMUSCULAR
  Filled 2020-06-06 (×5): qty 380

## 2020-06-06 NOTE — Addendum Note (Signed)
Addended by: Alford Highland on: 06/06/2020 10:24 AM     Modules accepted: Orders

## 2020-06-06 NOTE — Progress Notes (Signed)
06/06/20    Ronnie Mason is a 69 year old male here for his monthly naltrexone XR injection.    380mg  Naltrexone XR administered to L ventrogluteal.  Pt tolerated well with no ill effects.  He will monitor the injection site for s/sx of infection.      has been feeling some wearing off of his medication at the three week mark and wonders about increasing his dose.  I explained that Vivitrol only comes in one dose, but will contact Dr. [name] about increasing frequency or adding oral supplementation.      [name] will return q -3-4 weeks for further medication administrations.

## 2020-06-06 NOTE — Progress Notes (Signed)
[facility]  1:1 Session Note    Problem #: 1,2,3,4   Time: 30 Minutes  Notes: D) Patient got his vivitrol shot this morning. Patient may start taking the shot every 3 weeks - he said that his cravings increase the week before his shot. Patient stated that despite an increase in cravings over the past week, he hasnt been drinking more than before. Him and his wife are reportedly drinking according to plan - still not drinking on 2 days a week. They also just bought smaller wine glasses. Ultimately, patient would like to further reduce his drinking but he said its fine for now. Patient is feeling physically better and getting a bit more exercise.  A) Patient appeared open and honest during this session. P) Attend all appts.               [name] M.A. CDP

## 2020-06-07 ENCOUNTER — Other Ambulatory Visit (INDEPENDENT_AMBULATORY_CARE_PROVIDER_SITE_OTHER): Payer: Self-pay | Admitting: Internal Medicine

## 2020-06-07 ENCOUNTER — Telehealth (INDEPENDENT_AMBULATORY_CARE_PROVIDER_SITE_OTHER): Payer: Self-pay | Admitting: Internal Medicine

## 2020-06-07 DIAGNOSIS — G894 Chronic pain syndrome: Secondary | ICD-10-CM

## 2020-06-07 DIAGNOSIS — G609 Hereditary and idiopathic neuropathy, unspecified: Secondary | ICD-10-CM

## 2020-06-07 DIAGNOSIS — G250 Essential tremor: Secondary | ICD-10-CM

## 2020-06-07 DIAGNOSIS — Z79899 Other long term (current) drug therapy: Secondary | ICD-10-CM

## 2020-06-07 DIAGNOSIS — M542 Cervicalgia: Secondary | ICD-10-CM

## 2020-06-07 MED ORDER — PREGABALIN 50 MG OR CAPS
ORAL_CAPSULE | ORAL | 0 refills | Status: DC
Start: 2020-06-07 — End: 2020-06-12

## 2020-06-07 MED ORDER — PREGABALIN 100 MG OR CAPS
100.0000 mg | ORAL_CAPSULE | Freq: Three times a day (TID) | ORAL | 0 refills | Status: DC
Start: 2020-06-07 — End: 2020-06-12

## 2020-06-07 NOTE — Telephone Encounter (Signed)
Copied into refill TE to avoid confusion. Nothing further needed; closing encounter.

## 2020-06-07 NOTE — Telephone Encounter (Signed)
Patient came into clinic asking to speak about medication issue.     Stated he had lost medications in [place] and that he was able to get assistance from preferred pharmacy for refills.     Unfortunately, Rx of pregabalin for 100MG  and the pregabalin for 50 MG needs approval by provider to be refilled.     Patient needs 2 day supply ASAP. Patient hoping for pickup this afternoon.     Routing to [facility] for assistance/follow-up.

## 2020-06-07 NOTE — Telephone Encounter (Signed)
Nordberg-Strouse, Benay Pike, PSS/PSRPSS/PSRSigned  12:41 PM                Patient came into clinic asking to speak about medication issue.     Stated he had lost medications in [place] and that he was able to get assistance from preferred pharmacy for refills.     Unfortunately, Rx of pregabalin for 100MG  and the pregabalin for 50 MG needs approval by provider to be refilled.     Patient needs 2 day supply ASAP. Patient hoping for pickup this afternoon.     Routing to [facility] for assistance/follow-up.

## 2020-06-07 NOTE — Telephone Encounter (Signed)
Let him know important message in chart   - message being - To get drug screen today or tomorrow     Can close if he sees it

## 2020-06-08 MED ORDER — DULOXETINE HCL 60 MG OR CPEP
DELAYED_RELEASE_CAPSULE | ORAL | 2 refills | Status: DC
Start: 2020-06-08 — End: 2020-12-26

## 2020-06-08 NOTE — Telephone Encounter (Signed)
LVM to read message and informed Rx has been refilled and sent to pharmacy.

## 2020-06-10 ENCOUNTER — Other Ambulatory Visit (INDEPENDENT_AMBULATORY_CARE_PROVIDER_SITE_OTHER): Payer: Medicare PPO

## 2020-06-10 DIAGNOSIS — Z79899 Other long term (current) drug therapy: Secondary | ICD-10-CM

## 2020-06-10 DIAGNOSIS — G609 Hereditary and idiopathic neuropathy, unspecified: Secondary | ICD-10-CM

## 2020-06-10 MED ORDER — PRIMIDONE 50 MG OR TABS
50.0000 mg | ORAL_TABLET | Freq: Two times a day (BID) | ORAL | 1 refills | Status: DC
Start: 2020-06-10 — End: 2020-09-19

## 2020-06-10 NOTE — Progress Notes (Signed)
LAB

## 2020-06-10 NOTE — Telephone Encounter (Signed)
[facility] note sent to patient.  Dr. Joette Catching message below is that he needs to get a drug test which is ordered and needs to go to the lab.

## 2020-06-10 NOTE — Telephone Encounter (Addendum)
Incoming call from pt w/ spouse in the background to follow up    They were unclear on what lab they are supposed to be doing    Clarified that lab is related to pregabalin, they understood    Scheduled lab appt for today    They added that Dr. Araceli Bouche changed pregabalin to 300 mg twice a day so they will need a new RX.     Routing to PCP

## 2020-06-11 ENCOUNTER — Other Ambulatory Visit (HOSPITAL_COMMUNITY): Payer: Medicare PPO

## 2020-06-11 ENCOUNTER — Other Ambulatory Visit (HOSPITAL_BASED_OUTPATIENT_CLINIC_OR_DEPARTMENT_OTHER): Payer: Self-pay | Admitting: Anesthesiology

## 2020-06-11 ENCOUNTER — Telehealth (HOSPITAL_BASED_OUTPATIENT_CLINIC_OR_DEPARTMENT_OTHER): Payer: Self-pay

## 2020-06-11 DIAGNOSIS — G609 Hereditary and idiopathic neuropathy, unspecified: Secondary | ICD-10-CM

## 2020-06-11 DIAGNOSIS — Z79899 Other long term (current) drug therapy: Secondary | ICD-10-CM

## 2020-06-11 NOTE — Telephone Encounter (Signed)
Yes   Dr Gust Brooms to take over script

## 2020-06-11 NOTE — Progress Notes (Signed)
Chronic Pain Urine Temperature: 96 DEGREES F.    Ronnie Mason L Ivelisse Culverhouse, RT

## 2020-06-11 NOTE — Telephone Encounter (Signed)
Patient wanted to discuss with Dr. Araceli Bouche taking over his prescription for Pregabalin per Dr. Marisa Sprinkles. Patient notified that he will need to discuss prescription dosage changes with Dr. Marisa Sprinkles. Patient currently on Pregablin 150 mg, 3 times a day and discussed with Dr. Marisa Sprinkles changing to 300 mg 2 times a day.  Patient had question whether order for drug screen was still active. Notified patient order was still active.

## 2020-06-12 ENCOUNTER — Other Ambulatory Visit (HOSPITAL_BASED_OUTPATIENT_CLINIC_OR_DEPARTMENT_OTHER): Payer: Self-pay | Admitting: Anesthesiology

## 2020-06-12 ENCOUNTER — Other Ambulatory Visit: Payer: Self-pay

## 2020-06-12 DIAGNOSIS — M792 Neuralgia and neuritis, unspecified: Secondary | ICD-10-CM

## 2020-06-12 LAB — CHR PAIN DRUG RSK1 W/CNSLT, URN
Amphet/Methamphetamine Qual,URN: NEGATIVE
Barbiturate (Qual), URN: POSITIVE — AB
Benzodiazepines (Qual), URN: NEGATIVE
Buprenorphine Qualitatiave, URN: NEGATIVE
Cannabinoids (Qual), URN: NEGATIVE
Cocaine (Qual), URN: NEGATIVE
Fentanyl Qual, URN: NEGATIVE
Methadone (Qual), URN: NEGATIVE
Opiates (Qual), URN: NEGATIVE
Oxycodone (Qual), URN: NEGATIVE
Tramadol Qual, URN: NEGATIVE

## 2020-06-12 MED ORDER — PREGABALIN 300 MG OR CAPS
300.0000 mg | ORAL_CAPSULE | Freq: Two times a day (BID) | ORAL | 2 refills | Status: DC
Start: 2020-06-12 — End: 2020-09-09

## 2020-06-12 NOTE — Result Encounter Note (Signed)
Noted  

## 2020-06-14 ENCOUNTER — Ambulatory Visit (INDEPENDENT_AMBULATORY_CARE_PROVIDER_SITE_OTHER): Payer: Medicare PPO | Admitting: Podiatrist

## 2020-06-14 ENCOUNTER — Encounter (INDEPENDENT_AMBULATORY_CARE_PROVIDER_SITE_OTHER): Payer: Medicare PPO | Admitting: Podiatrist

## 2020-06-14 ENCOUNTER — Encounter (HOSPITAL_BASED_OUTPATIENT_CLINIC_OR_DEPARTMENT_OTHER): Payer: Self-pay

## 2020-06-14 ENCOUNTER — Ambulatory Visit (INDEPENDENT_AMBULATORY_CARE_PROVIDER_SITE_OTHER): Payer: Medicare PPO

## 2020-06-14 DIAGNOSIS — M205X1 Other deformities of toe(s) (acquired), right foot: Secondary | ICD-10-CM

## 2020-06-14 NOTE — Progress Notes (Signed)
Ronnie Mason is here today to see [name].  Please see [name] notes on today's visit.  While he is here today I fit him with a size 44 quick stride.  I modified these to match his old quick strides.  His complaint today is that we had to trim the other quick stride so they would fit into a narrower pair of shoes.  Today he has a much wider pair of new balance.  I was able to keep these full with today.  I added a 1/8 inch Korex piece to the right forefoot with a distal tip cut out for his toe.  I also added 1/8 inch Spenco top covers to both left and right inserts.  He will try these and will return back in 1 month for follow-up with [name].  I spent about 15 minutes in the modification process.

## 2020-06-14 NOTE — Progress Notes (Signed)
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     The current condition has existed for many years.    Patient describes the symptoms as not much.     The level the pain minimal.    The condition is worse when active.     Current treatments/recent studies include inserts.     The response to the treatment is good.     Patient's limitation is minimal.

## 2020-06-16 ENCOUNTER — Encounter (INDEPENDENT_AMBULATORY_CARE_PROVIDER_SITE_OTHER): Payer: Self-pay | Admitting: Podiatrist

## 2020-06-16 NOTE — Progress Notes (Signed)
Patient: Ronnie Mason   Patient DOB: [birth year]     DOS:  06/16/2020     Accompanied by:  patient was unaccompanied    Chief Complaint: [name] is a 69 year old year old male who is former patient presents today with new complaint(s): pain and deformity on right 2nd toe    History of present Illness:    (history is obtained by MA/RN and edited by [name])  The location of the condition is 2nd toe.  He is please with hallux and no issue   It began gradually.    The precipitating event was none.    Current symptoms are pain and deformity.    Patient denies: bleeding  and open wound.    The level the pain is mild to moderate.    The condition is is not getting better.    The affected area is made worse by ambulating.    Past treatments/studies include nothing .    The patient states no improvement with treatment.    Previous diagnostic test/evaluation:  none.      Goals: evaluate current condition    PCP:  [name], MD     PMH:   Past Medical History:   Diagnosis Date    Pain of right hip joint 01/23/2016    Intraparenchymal hemorrhage of brain (HCC) 2017    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 myelopathy or radiculopathy 05/28/2014    Carotid Sinus Hypersensitivity     Disc disorder of lumbosacral region     Hip injury     HYPERTENSION      HYPOTENSION      Syncope     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(HCC)     URI (upper respiratory infection)         Review of patient's allergies indicates:  Allergies   Allergen Reactions    Adhesives Skin: Rash    Bee Venom Skin: Hives, Skin: Itching and Swelling    Gabapentin      Flu like symptoms, diarrhea, body ache upset stomach    Methocarbamol Other     Patient's wife reports cognitive difficulties ("goofy")        Medications:   Outpatient Medications Prior to Visit   Medication Sig Dispense Refill    Acetaminophen 500 MG Oral Tab 2 tablets bid      Alpha-Lipoic Acid 300 MG tablet Take 1 tablet by mouth daily.      amLODIPine 5 MG  tablet TAKE ONE TABLET BY MOUTH TWICE DAILY 180 tablet 3    atorvastatin 10 MG Oral Tablet Take 1 tablet (10 mg) by mouth daily. 90 tablet 2    BENFOTIAMINE OR Take 250 mg by mouth daily.      Cholecalciferol (VITAMIN D3) 2000 units Oral Cap Take 1 capsule (2,000 Units) by mouth daily. For low level 1 capsule 1    Cyanocobalamin 1000 MCG Oral Tab one per day over-the-counter started 5/21 /2012 this level borderline low 1 Tab 1    diclofenac 1 % gel Apply 4 g topically 4 times a day. Apply to 2 gram four times daily to neck, trapezius muscle, and low back. Use a max of 32 grams a day. 300 g 4    diclofenac 1 % gel       DULoxetine 60 MG DR capsule TAKE 1 CAPSULE BY MOUTH TWICE DAILY 180 capsule 2    EPINEPHrine 0.3 MG/0.3ML auto-injector Inject as instructed per patient package insert, 0.3  mg intramuscularly or subcutaneously into the thigh, if needed to treat anaphylaxis 4 each 1    IBUPROFEN OR Take by mouth.      lidocaine 5 % patch Apply 1 patch onto the skin daily. Apply to painful area for up to 12 hours in a 24 hour period. 30 patch 10    lisinopril 20 MG tablet Take 1 tablet (20 mg) by mouth every 12 hours. 180 tablet 3    meloxicam 7.5 MG tablet Take 1 tablet (7.5 mg) by mouth daily as needed. For pain/inflammation. Take with food. (Patient not taking: Reported on 06/14/2020) 10 tablet 0    naltrexone 380 MG injection Inject 380 mg intramuscularly every 28 days.      naltrexone 50 MG tablet Take 1.5 tablets daily. (Patient not taking: Reported on 04/24/2020) 45 tablet 4    pregabalin 300 MG capsule Take 1 capsule (300 mg) by mouth 2 times a day. 60 capsule 2    primidone 50 MG tablet Take 1 tablet (50 mg) by mouth 2 times a day. 180 tablet 1    solifenacin 5 MG tablet TAKE ONE TABLET BY MOUTH ONCE DAILY (Patient not taking: Reported on 02/22/2020) 90 tablet 0    thiamine 100 MG tablet Take 1 tablet by mouth daily.       triamcinolone 0.1 % cream As needed to rear bid x 1 week 15 g 1      Facility-Administered Medications Prior to Visit   Medication Dose Route Frequency Provider Last Rate Last Admin    [START ON 06/27/2020] naltrexone (Vivitrol) injection 380 mg  380 mg Intramuscular q21 days Iles-Shih, Patrecia Pour, MD            PSH:   Past Surgical History:   Procedure Laterality Date    L meniciscus Left 07/2017    by Dr Marcelle Overlie , re did left menisicus     PR ANES; COLONOSCOPY  2002    repeat in 3 years    PR ANES; COLONOSCOPY & POLYPECTOMY  11/18/2007    repeat in 3 years    PR HALLUX RIGIDUS W/CHEILECTOMY 1ST MP JT W/O IMPLT Right 10/21/2017    Dr. Donn Pierini    PR UNLISTED PROCEDURE FEMUR/KNEE      PR UNLISTED PROCEDURE HANDS/FINGERS      PR UNLISTED PROCEDURE SPINE  2013    rfa to c3 to c 7     TOE SURGERY Right 02/09/2019    toe surgery  Right 01/12/2019    Dr Hilarie Fredrickson         Family History:  family history includes Colon Cancer in his father; Heart (other) in his mother; Other Family Hx in an other family member.    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  Non-smoker, 14 or more drinks/week almost exclusively wine with dinner.            Social Determinants of Health     Financial Resource Strain:     Difficulty of Paying Living Expenses: Not on file   Food Insecurity:     Worried About Programme researcher, broadcasting/film/videounning Out of Food in the Last Year: Not on file    [employer] Out of Food in the Last Year: Not on file   Transportation Needs:      Lack of Transportation (Medical): Not on file    Lack of Transportation (Non-Medical): Not on file   Physical Activity:     Days of Exercise per Week: Not on file    Minutes of Exercise per Session: Not on file   Stress:     Feeling of Stress : Not on file   Social Connections:     Frequency of Communication with Friends and Family: Not on file    Frequency of Social Gatherings with Friends and Family: Not on file    Attends Religious Services: Not on file    Active Member of Clubs or Organizations: Not on file    Attends BankerClub or Organization Meetings: Not on file    Marital Status: Not on file   Intimate Partner Violence:     Fear of Current or Ex-Partner: Not on file    Emotionally Abused: Not on file    Physically Abused: Not on file    Sexually Abused: Not on file   Housing Stability:     Unable to Pay for Housing in the Last Year: Not on file    Number of Places Lived in the Last Year: Not on file    Unstable Housing in the Last Year: Not on file        ROS is positive for musculoskeletal as mentioned in the HPI and as stated in the past medical history.  The patient's review of symptoms is otherwise unremarkable and documented in the electronic health record on 06/16/2020.    Physical Exam:   There were no vitals filed for this visit.     General: The patient is White, Well developed, appearing stated age and in no acute distress      Vascular:   RIGHT:  Dorsalis pedis pulse is palpable.  Posterior tibial pulse is palpable. Capillary filling time note to be immediate  on the distal hallux.      LEFT:  Dorsalis pedis pulse is palpable.  Posterior tibial pulse is palpable. Capillary filling time note to be immediate  on the distal hallux.      Neurologic:  RIGHT:  The sensation is intact on the foot/ankle.  Motor coordination is intact with normal muscle tone.  There is no manifestations of pathological reflexes noted.     LEFT:  The sensation is intact on the foot/ankle.  Motor coordination  is intact with normal muscle tone.  There is no manifestations of pathological reflexes noted.      Dermatological:   RIGHT:   Upon inspection, the skin note to have normal texture/tone/turgor.  There is no ulceration/open wound or dermatosis present.  Integument coloration is within normal range and temperature is within normal range.  The hair growth is normal.    LEFT:   Upon inspection, the skin note to have normal texture/tone/turgor.  There is no ulceration/open wound or dermatosis present.  Integument coloration is within normal range and temperature  is within normal range.  The hair growth is normal.      Musculoskeletal:   RIGHT:  There is rigid mallet toe deformity on second toe.  There is mild-moderate pain at dorsal DIPJ.  There is no redness/erythema noted.  There is no contracture of the 2nd MTPJ.  The 2nd MTPJ is stable.  The modify Lachman's test is negative.     LEFT:  There is no pain to palpation, the range of motion within normal limits and pain free, manual muscle testing 5/5 all extrinsic muscles to the foot.  There is no digital deformity noted. There is no forefoot deformity noted.  Patient has rectus foot.      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  Discussion included spectrum of treatments available such as continue with current care, padding, deep toe box/stable shoe, flexor tenotomy, orthotic modification and surgical correction.   After the discussion, today patient elected continue with current car.      He is interested in another pair of Quickstride insert with modification.      Quickstride insert order:  The patient was educated about the purpose of the device.  All questions were answered and no guarantee was given.  Patient was seen by Roe Coombs (pedorthist) for evaluation and dispense of Quickstride insert with  accommodation.      Return appointment: PRN or symptom worsen    [name] DPM, FACFAS  Reconstructive Ankle and Foot Surgeon  [facility]

## 2020-06-26 ENCOUNTER — Ambulatory Visit: Payer: Medicare PPO | Attending: Psychiatry

## 2020-06-26 ENCOUNTER — Ambulatory Visit (HOSPITAL_BASED_OUTPATIENT_CLINIC_OR_DEPARTMENT_OTHER): Payer: Medicare PPO | Admitting: Physician Assistant

## 2020-06-26 VITALS — BP 147/99 | HR 85 | Temp 97.3°F | Resp 16

## 2020-06-26 DIAGNOSIS — F1010 Alcohol abuse, uncomplicated: Secondary | ICD-10-CM

## 2020-06-26 DIAGNOSIS — F1020 Alcohol dependence, uncomplicated: Secondary | ICD-10-CM | POA: Insufficient documentation

## 2020-06-26 DIAGNOSIS — H60501 Unspecified acute noninfective otitis externa, right ear: Secondary | ICD-10-CM

## 2020-06-26 MED ORDER — CIPROFLOXACIN-DEXAMETHASONE 0.3-0.1 % OT SUSP
4.0000 [drp] | Freq: Two times a day (BID) | OTIC | 0 refills | Status: AC
Start: 2020-06-26 — End: 2020-07-03

## 2020-06-26 NOTE — Addendum Note (Signed)
Addended by: Jeanella Cara on: 06/26/2020 11:36 AM     Modules accepted: Level of Service

## 2020-06-26 NOTE — Progress Notes (Signed)
06/26/20    Ronnie Mason is a 69 year old male here for his monthly naltrexone XR injection.    380mg  Naltrexone XR administered to R ventrogluteal.  Pt tolerated well with no ill effects.  He will monitor the injection site for s/sx of infection.      We are now on a q 3 week schedule due to 4th week cravings, next injection 07/17/20.

## 2020-06-26 NOTE — Progress Notes (Signed)
Ronnie Mason is a 69 year old male here to discuss ear pain  An interpreter was not needed for the visit.    HPI:     Right ear pain. Pt stated  right ear pain that started on 3 days; pain is 3/10. Hurts to touch the auricle. Denied fever, chills, n/v, abdominal pain, sob, chest pain, changes of vision, no loss of hearing, no dizziness    Alcohol dependence.  The patient is on naltrexone XR, 380 mg IM on a monthly basis.  He stated still having some cravings but lesser than before.  He is currently drinking 2 glasses of wine per day max with dinner. Not sure if Vivitrol is working for cravings. Pt has not heard of acamprosate for AUD    Patient Active Problem List   Diagnosis    Syncope    Hypotension    Carotid Sinus Hypersensitivity    URI (upper respiratory infection)    Cervicalgia    Hyperlipidemia    Chronic pain    Bee sting allergy    Numbness and tingling of leg    Chronic back pain    B12 deficiency    Chronic renal insufficiency, stage III (moderate) (HCC)    Back pain, lumbosacral    Lumbar radiculopathy, chronic    Neck pain    Fall    Tremor    Sensory neuropathy    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 myelopathy or radiculopathy    Idiopathic peripheral neuropathy    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 [facility])    History of alcohol dependence (HCC)    Cerebral ventriculomegaly    Cerebellar hypoplasia (HCC)    Essential hypertension    Essential tremor    Hx of spontan intraparenchymal intracran bleed assoc with hypertension    Alcohol abuse    Intraparenchymal hemorrhage of brain (HCC)    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)    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)    Balance problem    Chronic pain of both shoulders    Vitamin D deficiency    Difficulty in walking, not elsewhere classified    Impaired mobility and ADLs    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(HCC)    Primary osteoarthritis of right hip    Mild vascular neurocognitive  disorder (HCC)    Right lumbar radiculitis    Greater trochanteric pain syndrome of right lower extremity    Complex tear of medial meniscus of left knee as current injury    Primary osteoarthritis of left knee    Acute left-sided low back pain without sciatica    Chronic left-sided low back pain without sciatica    Enuresis    Closed fracture of one rib of right side    MRSA (methicillin resistant staph aureus) culture positive    Myofascial pain    Macrocytosis without anaemia       SOCIAL HISTORY:  He reports current alcohol use.   He reports that he has never smoked. He has quit using smokeless tobacco.   He reports no history of drug use.     I reviewed the patient's recorded medical history and confirmed the medications, problem list, allergies, past medical history and social history.      Review of Systems   Constitutional: Negative for chills, fever, malaise/fatigue and weight loss.   HENT: Positive for ear pain (Right. Pain when touching auricle). Negative for congestion, ear discharge, hearing loss, nosebleeds, sinus pain, sore throat and tinnitus.  Eyes: Negative for double vision, photophobia, pain, discharge and redness.   Respiratory: Negative for cough, hemoptysis, sputum production, shortness of breath, wheezing and stridor.    Cardiovascular: Negative for chest pain and palpitations.   Gastrointestinal: Negative for abdominal pain, nausea and vomiting.   Genitourinary: Negative.    Musculoskeletal: Negative for back pain.   Skin: Negative for itching and rash.   Neurological: Negative for dizziness and headaches.   Psychiatric/Behavioral: Negative for depression and suicidal ideas.         OBJECTIVE:  BP (!) 147/99    Pulse 85    Temp 36.3 C (Skin)    Resp 16    SpO2 94%   Physical Exam  Vitals reviewed.   Constitutional:       General: He is not in acute distress.     Appearance: He is obese.   HENT:      Head: Normocephalic.      Comments: Ear canal patent. External ear canal with  erythema, tender to palpation.  Tenderness to palpation on pinna and tragus.  No tenderness to palpation on mastoid bone nor mandible nor zygomatic bone.  No lymphadenopathy. No ear discharge     Right Ear: Tympanic membrane and ear canal normal.      Left Ear: Tympanic membrane, ear canal and external ear normal.      Mouth/Throat:      Mouth: Mucous membranes are moist.      Pharynx: Oropharynx is clear. Uvula midline.   Eyes:      General: No scleral icterus.     Conjunctiva/sclera: Conjunctivae normal.   Cardiovascular:      Rate and Rhythm: Normal rate and regular rhythm.      Pulses: Normal pulses.      Heart sounds: S1 normal and S2 normal. No murmur heard.      Pulmonary:      Effort: Pulmonary effort is normal.      Breath sounds: Normal breath sounds. No wheezing, rhonchi or rales.   Abdominal:      General: Abdomen is flat. Bowel sounds are normal.      Palpations: Abdomen is soft.      Tenderness: There is no abdominal tenderness.   Musculoskeletal:         General: Normal range of motion.      Cervical back: Normal range of motion and neck supple. No rigidity or tenderness.      Right lower leg: No edema.      Left lower leg: No edema.   Lymphadenopathy:      Cervical: No cervical adenopathy.   Skin:     General: Skin is warm.   Neurological:      Mental Status: He is alert and oriented to person, place, and time.         ASSESSMENT AND PLAN:  Acute otitis externa of right ear, unspecified type  Pt's ear canal is patent.  Advised to return to clinic if no improvement or worsening of condition/pain/ear discharge or inability of the drops to get into the ear canal.   - ciprofloxacin-dexamethasone (Ciprodex) 0.3-0.1 % otic suspension; Place 4 drops into right ear 2 times a day for 7 days.  Dispense: 7.5 mL; Refill: 0   -Alternate drops with cleaning ear with a solution of 50% alcohol/white vinegar    Alcohol abuse  Consider adding acamprosate to Vivitrol shot    AVS given to patient      FOLLOW UP: 3  weeks.  Patient does not want to come earlier because in 3 weeks he already has an appointment for his Vivitrol shot.  Patient advised to come earlier to the clinic  if no improvement or worsening of condition/pain/ear discharge or inability of the drops to get into the ear canal, patient voiced understanding.  For next visit check:    Right ear pain          I spent a total of 49 minutes for the patient's care on the date of the service.            Jeanella Cara, Cordelia Poche  [facility]  [address] [place] [address]  [phone]

## 2020-07-03 ENCOUNTER — Ambulatory Visit: Payer: Medicare PPO | Attending: Addiction (Substance Use Disorder) | Admitting: Addiction (Substance Use Disorder)

## 2020-07-03 ENCOUNTER — Ambulatory Visit (HOSPITAL_BASED_OUTPATIENT_CLINIC_OR_DEPARTMENT_OTHER): Payer: Self-pay

## 2020-07-03 DIAGNOSIS — F1020 Alcohol dependence, uncomplicated: Secondary | ICD-10-CM | POA: Insufficient documentation

## 2020-07-03 NOTE — Progress Notes (Signed)
[facility] Addictions Program  1:1 Session Note    Problem #: 1,2,3,4   Time: 30 Minutes  Notes: D) Patient reported that he was in a lot of pain after getting his last Naltrexone shot. He is feeling better after physical therapy. Patient and I reviewed his drinking over the past several weeks. He stated that he mostly followed his plan but did buy a bottle of vodka once. We discussed this - patient couldn't really point to a reason for doing it. We discussed his goals with regards to drinking alcohol. He said that after the new year he will look to cut down his alcohol intake further - we talked about what this may look like and the things he can do. He will spend some time coming up with some plans to cut down his drinking before our next appt. A) Patient appeared open and honest during this session. P) Attend all appointments.              [name] M.A. CDP

## 2020-07-04 NOTE — Progress Notes (Signed)
CHIEF COMPLAINT:   Chief Complaint   Patient presents with    Back Pain    Hip Pain    Leg Pain    Foot Pain       HISTORY OF PRESENT ILLNESS:  The purpose of today's visit is interval reevaluation of chronic pain complaints, particularly right hip pain today. The patient has a pain history significant for bilateral lumbar facet arthropathy with prior positive response to RFA, but less response to more recent diagnostic injections as well as myofascial back pain.   The patient was last seen by [name], MD on 03/29/2020 at which time we recommended increase gabapentin to 200mg  TID and 300mg  bedtime and then gradual up-titration, continue duloxetine 60mg  BID, Tylenol and advised to discuss use of NSAIDs with PCP. He was also send for re-evaluation for intervention with Dr. and as per chart review, he received Hip Joint injection on 04/24/2020.    Wanted to discuss today how to reduce "pain and aggravation". Right hip joint is the main problem. Per his physical therapists, his right hip is "ready to go". They have worked on . A few weeks ago he received his third naltrexone injection in his right hip. Since then he has had increased pain of the right hip and the muscles to the side feels like tight rubber bands. Wondering if there is an injection or medications that might help with the tightness. Mentions that he was taking oxycodone once in the morning, but this was stopped by PCP. Also wondering whether surgery would be desirable or indicated. He also inquires about stem cell injections if they would be worthwhile. Not really doing home exercises given by Physical Therapy 2/2 increased pain.    Pain is described as generally dull, occasionally sharp.  Pain is constant. Heat and ice help. Made worse by twisting, turning, sitting down hard. Pain radiates down front thigh, side knee, and back of calf. Occasionally will have pain on the left side instead of the right. Has had  previous falls, and PCP is concerned about starting any muscle relaxer medications. Continues taking tylenol and ibuprofen. Did not notice much difference with change to pregabalin.      REVIEW OF SYSTEMS:  The review of systems documented by our clinic staff was provided by the patient. I reviewed and confirmed these details with Ronnie Mason and have no further detail to add.    PHYSICAL EXAMINATION:  BP (!) 156/92    Pulse 71    Temp 36.6 C (Temporal)    Wt (!) 110.9 kg (244 lb 9.6 oz)    SpO2 96%    BMI (P) 31.40 kg/m    Constitutional:Constitutional: well-appearing   Skin, exposed parts: color normal  Eyes: Negative for miosis bilaterally. Pupils appropriate for room lighting. Extraocular movements are intact bilaterally  Cardiovascular: No cyanosis, no dilation of jugular veins, no dyspnea.   Respiratory: Respirations are unlabored. No accessory muscle use noted  Gastrointestinal : Abdomen is soft, non-tender.  Neurologic: Neuro: - Strength:5/5 in all muscle groups of the upper and lower extremities  - Sensory: normal  - Reflex: present and symmetrical  Musculoskeletal: Normal bulk and tone. Broad based gate with feet pointed laterally.  Psychiatric: Judgement/insight are normal. Mood is "good". Affect is broad and appropriate. Patient is oriented to person, place and time    IMPRESSION:   Ronnie Mason is a 69 year old male with:  1. Right hip pain, Xrays from 2018 reviewed showing severe OA. Right  greater trochanteric bursitis (though this seems to be separate issue)  2. Axial low back painsecondary tofacet arthropathy at L4-L5,L5-S1 with prior positive response to RFA(last in 2017) as well as lumbar facet arthropathy. More recent diagnostic medial branch blocks have not been helpful so do not plan to proceed to RFA again.  Significant ongoing myofascial component with pain radiating from back to hips.  3. Lower back pain with radiation into bilateral lower extremities radicular in nature (multiple disc  bulge on MRI). Recent LESI without long term improvement    4. Deconditioning- Ongoing PT    5.  Prior treatment failures including:Short duration of benefit forinterlaminar lumbarepidural steroids and TFESI, memantine, Naltrexone    6.  Multiple prior injections by outside providers(exluding MBB and trigger point injections):   Dr. Gustavus Mason  ? 03/23/2018 -LEFT L5 TFESI with 80-85%relief for about 3-4 weeksof just axial back pain.  ? 09/15/2017 -Bilateral L5TFESI with 50-75% reliefof mixture of low back and leg pain.  ? 05/19/2017 -RIGHT L5 TFESIwith 100% relief ofRadicularsymptoms for about 3 months  ? 08/12/2016 - L5-S1 ILESI  ? 07/01/2016 - Ultrasound guided right trochanteric bursa injection   ? 05/20/2016 - Bilateral shoulder injections  ? 05/13/2016 - Right L3 L4 L5/S1 RFA   ? 02/19/2016 - Left L3, L4, L5/S1 RFA   ? 12/27/2012 - L5-S1 IL ESI  ? 08/31/2012 - Left L5 TFESI  ? 03/30/2012 - Left L4/5, L5/S1 facet inj  ? 02/10/2012 - Left C6 C7 MB RFA  ? 07/03/2011 - Left L3, L4, L5/S1 RFA  ? 05/12/2011 - Left C4, C5, C6 RFA    7. 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 Given the recent set back we would consider it appropriate for a short course of meloxicam. This would be a replacement for ibuprofen. We do not recommend surgery, as we do not believe he would be a good candidate. Regarding stem cell injections it does not seem that this would be harmful, but whether he would benefit is uncertain. It would be reasonable to discuss this with someone more familiar with the procedure.      PLAN:   We discussed our impressions and the following suggestions/options in detail and  provided him with information in the after visit summary.  Mr. Weida had no further questions.    Diagnostic evaluation: none    Treatment Options: Can consider Gua Sha massage. Consider outside evaluation in PRP/Stem Cell injections--This is outside Dr. Nathaniel Man expertise but not unreasonable to get another opinion    Medication suggestions: Stop taking ibuprofen and start taking meloxicam once daily for 10 days  Start Magnesium -(ascorbate, chelated, glycinate, mixed salts or citrate) - (not oxide, sulfate and not from a drug store) to bowel tolerance which means you take enough until you get 1-2 easy to pass bowel movements each day. If you have regular BMs already, then take as much Magnesium as you can without getting diarrhea. This should help with muscle spasms    Procedure suggestions: Will consider caudal epidural in future depending how symptoms progress. Do not recommend repeat surgical eval at this time.    Follow-up: 4 months  Priscella Mann MBBS  Pain Medicine Fellow  [facility]

## 2020-07-05 ENCOUNTER — Ambulatory Visit: Payer: Medicare PPO | Attending: Anesthesiology | Admitting: Anesthesiology

## 2020-07-05 ENCOUNTER — Encounter (HOSPITAL_BASED_OUTPATIENT_CLINIC_OR_DEPARTMENT_OTHER): Payer: Self-pay | Admitting: Anesthesiology

## 2020-07-05 VITALS — BP 156/92 | HR 71 | Temp 97.8°F | Wt 244.6 lb

## 2020-07-05 DIAGNOSIS — M48061 Spinal stenosis, lumbar region without neurogenic claudication: Secondary | ICD-10-CM | POA: Insufficient documentation

## 2020-07-05 MED ORDER — MELOXICAM 7.5 MG OR TABS
7.5000 mg | ORAL_TABLET | Freq: Every day | ORAL | 0 refills | Status: DC
Start: 2020-07-05 — End: 2020-07-15

## 2020-07-05 NOTE — Progress Notes (Signed)
I saw and evaluated the patient with Dr. Shirlyn Goltz and Trevor Mace, who conducted the initial history.  I reviewed and confirmed the history in detail with Mr. Kalafut in person.   I was present for the examination and formulation portions of the encounter. I agree with the findings and the plan of care as documented in our notes. I did edit the trainee's note.    I spent a total of 30 minutes for the patient's care on the date of the service.          [name], MD  Assistant Professor  [facility]

## 2020-07-05 NOTE — Progress Notes (Signed)
Review of Systems   Constitutional: Negative.    HENT: Negative.    Eyes: Negative.    Respiratory: Negative.    Cardiovascular: Negative.    Gastrointestinal: Negative.    Endocrine: Negative.    Genitourinary: Negative.    Musculoskeletal: Positive for arthralgias, back pain, gait problem, myalgias, neck pain and neck stiffness.   Skin: Negative.    Allergic/Immunologic: Negative.    Neurological: Positive for weakness and numbness.   Hematological: Negative.    Psychiatric/Behavioral: Negative.

## 2020-07-05 NOTE — Patient Instructions (Addendum)
Magnesium -(ascorbate, chelated, glycinate, mixed salts or citrate) - (not oxide, sulfate and not from a drug store) to bowel tolerance which means you take enough until you get 1-2 easy to pass bowel movements each day. If you have regular BMs already, then take as much Magnesium as you can without getting diarrhea.     Rehabilitation: Can consider Gua Sha massage.    Medication suggestions: Stop taking ibuprofen and start taking meloxicam once daily.

## 2020-07-15 ENCOUNTER — Telehealth (HOSPITAL_BASED_OUTPATIENT_CLINIC_OR_DEPARTMENT_OTHER): Payer: Self-pay

## 2020-07-15 ENCOUNTER — Other Ambulatory Visit (HOSPITAL_BASED_OUTPATIENT_CLINIC_OR_DEPARTMENT_OTHER): Payer: Self-pay | Admitting: Anesthesiology

## 2020-07-15 DIAGNOSIS — M48061 Spinal stenosis, lumbar region without neurogenic claudication: Secondary | ICD-10-CM

## 2020-07-15 MED ORDER — MELOXICAM 7.5 MG OR TABS
7.5000 mg | ORAL_TABLET | Freq: Every day | ORAL | 0 refills | Status: DC
Start: 2020-07-15 — End: 2020-07-31

## 2020-07-15 NOTE — Telephone Encounter (Signed)
Patient requesting refill for Meloxicam 7.5 mg. Dr. Araceli Bouche suggested patient stop taking Ibuprofen and start on Meloxicam 7.5 mg for 10 days. Patients last dose of medication will be 07/16/2020. Patient stated he has had good pain relief on medication. Pain went from a 7-8/10 to 2/10.

## 2020-07-16 NOTE — Telephone Encounter (Signed)
Relayed Dr. Nathaniel Man message concerning Meloxicam to patient:  Refill is for limited supply. He should use only as needed and get away from using it every day to avoid side effects. Patient reported at this time he is not having side effects and agreed to take only as needed.

## 2020-07-17 ENCOUNTER — Ambulatory Visit (HOSPITAL_BASED_OUTPATIENT_CLINIC_OR_DEPARTMENT_OTHER): Payer: Medicare PPO | Admitting: Addiction (Substance Use Disorder)

## 2020-07-17 ENCOUNTER — Ambulatory Visit: Payer: Medicare PPO | Attending: Psychiatry

## 2020-07-17 DIAGNOSIS — F1021 Alcohol dependence, in remission: Secondary | ICD-10-CM | POA: Insufficient documentation

## 2020-07-17 DIAGNOSIS — F1020 Alcohol dependence, uncomplicated: Secondary | ICD-10-CM

## 2020-07-17 NOTE — Progress Notes (Signed)
[facility] Addictions Program  1:1 Session Note    Problem #: 1,2,3,4   Time: 30 Minutes  Notes: D) Patient reported that he has been following the plan when it comes to his drinking. He said that he and his wife are splitting a bottle of wine 4-5x per week. Patient said that he will be discussing with his wife further cutting down their drinking after the holidays. He said that he spent 5 days alone at [address] house and his drinking didn't ramp up. He said there has not been any issues with hard liquor lately. Patient is about to have surgery on his toe. We discussed losing weight and getting more exercise as well.   A) Patient appeared open and honest during this session. P) Attend all appointments. Take medications as prescribed.              [name] M.A. CDP

## 2020-07-17 NOTE — Progress Notes (Signed)
07/17/20    Ronnie Mason is a 69 year old male here for his monthly naltrexone XR injection.    380mg  Naltrexone XR administered to L ventrogluteal.  Pt tolerated well with no ill effects.  He will monitor the injection site for s/sx of infection.      Next injection due 1/12.  Pt is scheduled for surgery on 1/14.  Will contact Dr. 2/14 and Dr. [name] to see if we should perhaps delay next admin until the week after surgery.

## 2020-07-31 ENCOUNTER — Other Ambulatory Visit (HOSPITAL_BASED_OUTPATIENT_CLINIC_OR_DEPARTMENT_OTHER): Payer: Self-pay | Admitting: Unknown Physician Specialty

## 2020-07-31 DIAGNOSIS — M48061 Spinal stenosis, lumbar region without neurogenic claudication: Secondary | ICD-10-CM

## 2020-07-31 MED ORDER — MELOXICAM 7.5 MG OR TABS
7.5000 mg | ORAL_TABLET | Freq: Every day | ORAL | 0 refills | Status: DC | PRN
Start: 2020-07-31 — End: 2020-08-15

## 2020-07-31 NOTE — Telephone Encounter (Signed)
Informed patient a refill for Meloxicam, 14 day supply of 7 tablets will be provided and should extend up to his follow up with pharmacy. Reminded patient this medication should not be used on a daily basis and relayed this class of medications is not good for neuropathic pain per Dr. Alford Highland.

## 2020-07-31 NOTE — Telephone Encounter (Addendum)
S/B:Ronnie Mason called and stated: "I need a refill on my meloxicam 7.5mg . Have been taking the medication as needed. Dr. Araceli Bouche prescribed me a ten day supply, I have #1 pill left, have taken them over the last 17 days as needed. I think I would feel a lot better if I could keep taking the Meloxicam on a daily basis, or as needed, whatever she thinks is best. I don't know how I can take the med or if starting/stopping affects anything. I haven't had any side effects and it seems to give me good relief for my nerve pain." Ronnie Mason pharmacy visit for education regarding Meloxicam and usage, phone visit scheduled. Forwarded to Dr. Araceli Bouche.    A: Meloxicam education and refill needed.    R: Please refill Meloxicam if appropriate.

## 2020-08-06 ENCOUNTER — Ambulatory Visit
Payer: Medicare PPO | Attending: Student in an Organized Health Care Education/Training Program | Admitting: Student in an Organized Health Care Education/Training Program

## 2020-08-06 DIAGNOSIS — Z79899 Other long term (current) drug therapy: Secondary | ICD-10-CM | POA: Insufficient documentation

## 2020-08-06 NOTE — Progress Notes (Signed)
CLINICAL PHARMACIST TELEPHONE VISIT    IDENTIFICATION/CHIEF COMPLAINT:   70 year old male with a pain history significant for bilateral lumbar facet arthropathy with prior positive response to RFA, but less response to more recent diagnostic injections as well as myofascial back pain    REFERRAL/CONSULT: Dr. Araceli Mason consulted PharmD on 07/05/2020 to provide clinical assessment and facilitate meloxicam education.     Previous Evaluation by Ronnie Mason on 12/3/20221:  We discussed the myofascial nature of the pain he has experienced. Given the recent set back we would consider it appropriate for a short course of meloxicam. This would be a replacement for ibuprofen. We do not recommend surgery, as we do not believe he would be a good candidate. Regarding stem cell injections it does not seem that this would be harmful, but whether he would benefit is uncertain. It would be reasonable to discuss this with someone more familiar with the procedure.     Stop taking ibuprofen and start taking meloxicam once daily for 10 days    Current Analgesic Regimen:   Meloxicam 7.5 mg daily PRN (no doses over the last week)  Pregabalin 300 mg BID   Duloxetine 60 mg BID   APAP 500 mg (average 6 tabs/week)  Ibuprofen 200 mg, 3 tabs BID over the last week     [facility] Prescription Monitoring Program reviewed on 08/07/2019:  [place] Medication Dispense History (from 08/12/2019 to 07/31/2020)     Dispensed Days Supply Quantity   PREGABALIN 300 MG CAPSULE 07/11/2020 30 60 unspecified   PREGABALIN 300 MG CAPSULE 06/12/2020 30 60 unspecified   PREGABALIN 100 MG CAPSULE 03/29/2020 90 270 unspecified   PREGABALIN 50 MG CAPSULE 03/29/2020 90 270 unspecified   PREGABALIN 50 MG CAPSULE 12/21/2019 90 270 unspecified   PREGABALIN 100 MG CAPSULE 12/20/2019 90 270 unspecified   PREGABALIN 100 MG CAPSULE 11/06/2019 30 90 unspecified   PREGABALIN 100 MG CAPSULE 09/30/2019 30 90 unspecified   PREGABALIN 50 MG CAPSULE 09/30/2019 60 180 unspecified    PREGABALIN 100 MG CAPSULE 08/26/2019 30 90 unspecified       SUBJECTIVE/OBJECTIVE:  The patient reports that he has not used any meloxicam over the last week has instead been using ibuprofen 600 mg BID. He reports much better pain relief of his back and right hip pain with radiation down his RLE with meloxicam.     The patient denies any side-effects from meloxicam.     Functionally, he is resting most of the day due to pain. He can perform chores around the house with the encouragement from his wife, Ronnie Mason, and can walk for about 45 minutes without pain becoming an issue.     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     eCrCl (02/10/20): >100 mL/min  LFTs (06/06/2020): WNL      ASSESSMENT/PLAN:   The patient is currently not using meloxicam but is using ibuprofen 1200 mg/day for his right hip pain with radiation down his RLE. He denies any side-effects from his current analgesic regimen. He is functionally limited by pain. Discussed NSAIDs and risks of long-term use. Advised that NSAIDs are not indicated for neuropathic pain. Recommended that he stop ibuprofen and restart meloxicam 7.5 mg daily PRN (NTE 4 doses/week). If his pain is not well controlled he can consider using APAP daily (NTE 2000 mg/day). Would advise against higher  daily doses of APAP due to history/current alcohol consumption as well as concurrent high dose duloxetine and Vivitrol injections which can both be hepatotoxic.    Pharmacist Follow-up: none    Prescriptions Written during Encounter:   No orders of the defined types were placed in this encounter.      I called and spoke to the patient over the telephone for this encounter. I spent a total time of 15 minutes on the phone with the patient, of which more than 50% was spent counseling as outlined  in this note.     Ronnie Mason, PharmD  Clinical Pain Pharmacist   [facility]

## 2020-08-14 ENCOUNTER — Telehealth (HOSPITAL_BASED_OUTPATIENT_CLINIC_OR_DEPARTMENT_OTHER): Payer: Medicare PPO

## 2020-08-14 ENCOUNTER — Ambulatory Visit: Payer: Medicare PPO | Attending: Psychiatry

## 2020-08-14 DIAGNOSIS — F1020 Alcohol dependence, uncomplicated: Secondary | ICD-10-CM | POA: Insufficient documentation

## 2020-08-14 DIAGNOSIS — F1021 Alcohol dependence, in remission: Secondary | ICD-10-CM | POA: Insufficient documentation

## 2020-08-14 NOTE — Progress Notes (Signed)
08/14/2020    Ronnie Mason is a 70 year old male here for his monthly naltrexone XR injection.    Ronnie Mason reports that the last IM injection to his R ventrogluteal resulted in hip pain and bruising, possibly related to chronic hip injury. We agree to administer today's injection to the R dorsogluteal site.     380mg  Naltrexone XR administered to R dorsogluteal.  Pt tolerated well with no ill effects.  He will monitor the injection site for s/sx of infection.      Next injection due 09/04/2020.  11/02/2020 gets Naltrexone XR every 3 weeks.

## 2020-08-15 ENCOUNTER — Telehealth (HOSPITAL_BASED_OUTPATIENT_CLINIC_OR_DEPARTMENT_OTHER): Payer: Self-pay

## 2020-08-15 ENCOUNTER — Other Ambulatory Visit (HOSPITAL_BASED_OUTPATIENT_CLINIC_OR_DEPARTMENT_OTHER): Payer: Self-pay

## 2020-08-15 DIAGNOSIS — M48061 Spinal stenosis, lumbar region without neurogenic claudication: Secondary | ICD-10-CM

## 2020-08-15 NOTE — Telephone Encounter (Signed)
Requested Medication: Meloxicam  Requested by: Patient    Last refill written: 07/31/2020  Written by: Alvester Chou    Last appointment: 08/06/2020 with Clinical Pain Pharmacist  Scheduled follow up: 11/22/2020 with Dr. Dr. America Brown PMP Medication Dispense History (from 08/21/2019 to 08/15/2020)     Dispensed Days Supply Quantity   PREGABALIN 300 MG CAPSULE 08/08/2020 30 60 unspecified   PREGABALIN 300 MG CAPSULE 07/11/2020 30 60 unspecified   PREGABALIN 300 MG CAPSULE 06/12/2020 30 60 unspecified   PREGABALIN 100 MG CAPSULE 03/29/2020 90 270 unspecified   PREGABALIN 50 MG CAPSULE 03/29/2020 90 270 unspecified   PREGABALIN 50 MG CAPSULE 12/21/2019 90 270 unspecified   PREGABALIN 100 MG CAPSULE 12/20/2019 90 270 unspecified   PREGABALIN 100 MG CAPSULE 11/06/2019 30 90 unspecified   PREGABALIN 100 MG CAPSULE 09/30/2019 30 90 unspecified   PREGABALIN 50 MG CAPSULE 09/30/2019 60 180 unspecified   PREGABALIN 100 MG CAPSULE 08/26/2019 30 90 unspecified      meloxicam 7.5 MG tablet [[id number]] EXPIRED   Order Details  Dose: 7.5 mg Route: Oral Frequency: Daily PRN for pain   Dispense Quantity: 7 tablet Refills: 0         Sig: Take 1 tablet (7.5 mg) by mouth daily as needed for pain for up to 14 days. For pain/inflammation. Take with food.        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 [place] [phone]   [address] [place], [place] [address]   Phone:  [phone] Fax:  [phone]   DEA #:  --       Orders with any of  the following pharmaceutical classes: Anti-rheumatic    Name Dose Frequency Start Date End Date Medication Warnings Interventions? Order Mode    IBUPROFEN OR       Outpatient      Warnings History    Total number of overridden warnings: 1   Full Warnings History     Pharmacist Clinical Review History    This prescription has not been clinically reviewed.     Outpatient Medication Detail    meloxicam 7.5 MG tablet        Sig: Take 1 tablet (7.5 mg) by mouth daily as needed for pain for up to 14 days. For pain/inflammation. Take with food.        Sent to pharmacy as: Meloxicam 7.5 MG Oral Tablet (Mobic)        Class: Normal        Route: Oral        E-Prescribing Status: Receipt confirmed by pharmacy (07/31/2020 12:24 PM PST)          Event History       Event History     Pharmacy    [facility] [address] [id number] [address] [place] [phone]     Tracking Links    Cosign Tracking Order  Transmittal Tracking     Relevant Medication Information    None found.     Regulatory affairs officer

## 2020-08-15 NOTE — Telephone Encounter (Signed)
S/B Patient is requesting a physical therapy referral for [facility] (fax [phone]). He says he has been going for a while and they are requesting a referral faxed to them.     A: Referral to PT    R: Please write referral, if appropriate.

## 2020-08-16 ENCOUNTER — Ambulatory Visit (INDEPENDENT_AMBULATORY_CARE_PROVIDER_SITE_OTHER): Payer: Medicare PPO | Admitting: Podiatrist

## 2020-08-16 ENCOUNTER — Other Ambulatory Visit (HOSPITAL_BASED_OUTPATIENT_CLINIC_OR_DEPARTMENT_OTHER): Payer: Self-pay | Admitting: Anesthesiology

## 2020-08-16 DIAGNOSIS — M545 Low back pain, unspecified: Secondary | ICD-10-CM

## 2020-08-16 DIAGNOSIS — G8929 Other chronic pain: Secondary | ICD-10-CM

## 2020-08-16 DIAGNOSIS — M48061 Spinal stenosis, lumbar region without neurogenic claudication: Secondary | ICD-10-CM

## 2020-08-16 MED ORDER — MELOXICAM 7.5 MG OR TABS
7.5000 mg | ORAL_TABLET | Freq: Every day | ORAL | 0 refills | Status: DC | PRN
Start: 2020-08-16 — End: 2020-12-03

## 2020-08-20 NOTE — Telephone Encounter (Signed)
Faxed referral via [facility] to [facility] [phone]

## 2020-08-21 ENCOUNTER — Ambulatory Visit (HOSPITAL_BASED_OUTPATIENT_CLINIC_OR_DEPARTMENT_OTHER): Payer: Self-pay

## 2020-08-23 ENCOUNTER — Ambulatory Visit (INDEPENDENT_AMBULATORY_CARE_PROVIDER_SITE_OTHER): Payer: Medicare PPO | Admitting: Podiatrist

## 2020-08-27 ENCOUNTER — Other Ambulatory Visit (HOSPITAL_BASED_OUTPATIENT_CLINIC_OR_DEPARTMENT_OTHER): Payer: Self-pay | Admitting: Anesthesiology

## 2020-08-27 DIAGNOSIS — M48061 Spinal stenosis, lumbar region without neurogenic claudication: Secondary | ICD-10-CM

## 2020-08-27 NOTE — Telephone Encounter (Signed)
Requested Medication: Meloxicam  Requested by: Pharmacy Interface    Last refill written: 08/16/2020  Written by: Dr. Araceli Bouche    Last appointment: 08/06/2020 with Pain Pharmacist  Scheduled follow up: 11/22/2020 with Dr. America Brown PMP Medication Dispense History (from 09/02/2019 to 08/27/2020)     Dispensed Days Supply Quantity   PREGABALIN 300 MG CAPSULE 08/08/2020 30 60 unspecified   PREGABALIN 300 MG CAPSULE 07/11/2020 30 60 unspecified   PREGABALIN 300 MG CAPSULE 06/12/2020 30 60 unspecified   PREGABALIN 100 MG CAPSULE 03/29/2020 90 270 unspecified   PREGABALIN 50 MG CAPSULE 03/29/2020 90 270 unspecified   PREGABALIN 50 MG CAPSULE 12/21/2019 90 270 unspecified   PREGABALIN 100 MG CAPSULE 12/20/2019 90 270 unspecified   PREGABALIN 100 MG CAPSULE 11/06/2019 30 90 unspecified   PREGABALIN 100 MG CAPSULE 09/30/2019 30 90 unspecified   PREGABALIN 50 MG CAPSULE 09/30/2019 60 180 unspecified      meloxicam 7.5 MG tablet [[id number]]    Order Details  Dose: 7.5 mg Route: Oral Frequency: Daily PRN for pain   Dispense Quantity: 7 tablet Refills: 0         Sig: Take 1 tablet (7.5 mg) by mouth daily as needed for pain for up to 14 days. For pain/inflammation. Take with food.        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 [place] [phone]   [address] [place] [address]   Phone:  [phone] Fax:  [phone]   DEA #:  --       Orders with any of the following pharmaceutical classes: Anti-rheumatic    Name Dose Frequency Start Date  End Date Medication Warnings Interventions? Order Mode    IBUPROFEN OR       Outpatient      Warnings History    Total number of overridden warnings: 1   Full Warnings History     Pharmacist Clinical Review History    This prescription has not been clinically reviewed.     Outpatient Medication Detail    meloxicam 7.5 MG tablet        Sig: Take 1 tablet (7.5 mg) by mouth daily as needed for pain for up to 14 days. For pain/inflammation. Take with food.        Sent to pharmacy as: Meloxicam 7.5 MG Oral Tablet (Mobic)        Class: Normal        Route: Oral        E-Prescribing Status: Receipt confirmed by pharmacy (08/16/2020 4:11 PM PST)          Event History       Event History     Pharmacy    [facility] [address] [place] [phone]     Tracking Links    Cosign Tracking Order Transmittal Tracking     Relevant Medication Information    None found.  Regulatory affairs officer

## 2020-09-04 ENCOUNTER — Ambulatory Visit (HOSPITAL_BASED_OUTPATIENT_CLINIC_OR_DEPARTMENT_OTHER): Payer: Medicare PPO | Admitting: Addiction (Substance Use Disorder)

## 2020-09-04 ENCOUNTER — Other Ambulatory Visit (HOSPITAL_BASED_OUTPATIENT_CLINIC_OR_DEPARTMENT_OTHER): Payer: Self-pay | Admitting: Anesthesiology

## 2020-09-04 ENCOUNTER — Ambulatory Visit: Payer: Medicare PPO | Attending: Psychiatry

## 2020-09-04 ENCOUNTER — Other Ambulatory Visit (INDEPENDENT_AMBULATORY_CARE_PROVIDER_SITE_OTHER): Payer: Self-pay | Admitting: Internal Medicine

## 2020-09-04 DIAGNOSIS — F1020 Alcohol dependence, uncomplicated: Secondary | ICD-10-CM

## 2020-09-04 DIAGNOSIS — M542 Cervicalgia: Secondary | ICD-10-CM

## 2020-09-04 DIAGNOSIS — I10 Essential (primary) hypertension: Secondary | ICD-10-CM

## 2020-09-04 DIAGNOSIS — M48061 Spinal stenosis, lumbar region without neurogenic claudication: Secondary | ICD-10-CM

## 2020-09-04 DIAGNOSIS — F1021 Alcohol dependence, in remission: Secondary | ICD-10-CM | POA: Insufficient documentation

## 2020-09-04 DIAGNOSIS — M5417 Radiculopathy, lumbosacral region: Secondary | ICD-10-CM

## 2020-09-04 NOTE — Progress Notes (Signed)
[facility] Addictions Program  1:1 Session Note    Problem #: 1,2,3,4   Time: 60 Minutes  Notes: D) Patient stated that he has spent a bunch of time at his house on [place]. He said that he and his wife arent drinking as much as they used to. He said that he has been feeling ok - getting more exercise. Patient has foot surgery next week - was delayed due to coronavirus. Patient and I discussed his plan to further cut down his alcohol use. He stated that he hasn't been drinking as much because he has been busy. We talked about having more things to do that dont involve drinking. A) Patient appeared open and honest during this session. P) Attend all appointments. Take medication as prescribed.              [name] M.A. CDP

## 2020-09-04 NOTE — Progress Notes (Signed)
09/04/2020    Ronnie Mason is a 70 year old male here for his naltrexone XR injection. Ronnie Mason gets Naltrexone XR every 3 weeks.     380mg  Naltrexone XR administered to L dorsogluteal.  Pt tolerated well with no ill effects.  He will monitor the injection site for s/sx of infection.      Prefers dorsogluteal injections to right side, okay with ventrogluteal or dorsogluteal to left side.     Next injection due 09/25/2020.

## 2020-09-05 MED ORDER — LIDOCAINE 5 % EX PTCH
1.0000 | MEDICATED_PATCH | Freq: Every day | CUTANEOUS | 5 refills | Status: DC
Start: 2020-09-05 — End: 2022-03-25

## 2020-09-05 MED ORDER — LISINOPRIL 20 MG OR TABS
20.0000 mg | ORAL_TABLET | Freq: Two times a day (BID) | ORAL | 1 refills | Status: DC
Start: 2020-09-05 — End: 2020-12-26

## 2020-09-08 ENCOUNTER — Other Ambulatory Visit (HOSPITAL_BASED_OUTPATIENT_CLINIC_OR_DEPARTMENT_OTHER): Payer: Self-pay | Admitting: Anesthesiology

## 2020-09-08 DIAGNOSIS — M792 Neuralgia and neuritis, unspecified: Secondary | ICD-10-CM

## 2020-09-09 MED ORDER — PREGABALIN 300 MG OR CAPS
ORAL_CAPSULE | ORAL | 2 refills | Status: DC
Start: 2020-09-09 — End: 2020-12-16

## 2020-09-09 NOTE — Telephone Encounter (Signed)
Requested Medication: pregabalin 300mg   Requested by: patient    Last refill written: 06/12/20  Written by: Dr. 13/10/21    Last appointment: 08/06/2020 with 10/04/2020 PharmD  Scheduled follow up: 11/22/2020 with Dr. 11/24/2020 PMP Medication Dispense History (from 09/15/2019 to 09/08/2020)     Dispensed Days Supply Quantity   PREGABALIN 300 MG CAPSULE 08/08/2020 30 60 unspecified   PREGABALIN 300 MG CAPSULE 07/11/2020 30 60 unspecified   PREGABALIN 300 MG CAPSULE 06/12/2020 30 60 unspecified   PREGABALIN 100 MG CAPSULE 03/29/2020 90 270 unspecified   PREGABALIN 50 MG CAPSULE 03/29/2020 90 270 unspecified   PREGABALIN 50 MG CAPSULE 12/21/2019 90 270 unspecified   PREGABALIN 100 MG CAPSULE 12/20/2019 90 270 unspecified   PREGABALIN 100 MG CAPSULE 11/06/2019 30 90 unspecified   PREGABALIN 100 MG CAPSULE 09/30/2019 30 90 unspecified   PREGABALIN 50 MG CAPSULE 09/30/2019 60 180 unspecified

## 2020-09-10 ENCOUNTER — Ambulatory Visit (INDEPENDENT_AMBULATORY_CARE_PROVIDER_SITE_OTHER): Payer: Medicare PPO | Admitting: Podiatrist

## 2020-09-10 DIAGNOSIS — M205X1 Other deformities of toe(s) (acquired), right foot: Secondary | ICD-10-CM

## 2020-09-10 NOTE — Patient Instructions (Signed)
Instructions with emphasis on keeping the dressing on and dry for 3 day and the use of open-toed shoes until the closed-toed shoes comfortable.  After 3 days, patient can take the dressing off and apply bandaid for several days if needed.  Extra-strength Tylenol recommended for discomfort.

## 2020-09-10 NOTE — Progress Notes (Signed)
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  After informed consent was obtained from the patient, a local digital block was performed.  Using 64 blade, a small percutaneous incision was made on the medial aspect of DIPJ and carefully tenotomy was perform.  The patient had assisted by dynamically contracting the digit.  The digit had good perfusion/capillary fill time was immediate.  Tourniquet was not used.  Dressing was applied using 1/2 " steri strip, 2 x 2 gauze and coban wrap.      Patient was given written and verbal post-operative instructions with emphasis on keeping the dressing on and dry for 3 day and the use of open-toed shoes until the closed-toed shoes comfortable.  After 3 days, patient can take the dressing off and apply bandaid for several days if needed.  Extra-strength Tylenol recommended for discomfort.      Follow-up will be in 1-2 weeks.  Patient was instructed call our office is there is any issues.    [name] DPM, FACFAS  Reconstructive Ankle and Foot Surgeon  [facility]

## 2020-09-13 ENCOUNTER — Encounter (HOSPITAL_BASED_OUTPATIENT_CLINIC_OR_DEPARTMENT_OTHER): Payer: Self-pay | Admitting: Anesthesiology

## 2020-09-13 ENCOUNTER — Ambulatory Visit: Payer: Medicare PPO | Attending: Anesthesiology | Admitting: Anesthesiology

## 2020-09-13 DIAGNOSIS — M47816 Spondylosis without myelopathy or radiculopathy, lumbar region: Secondary | ICD-10-CM | POA: Insufficient documentation

## 2020-09-13 DIAGNOSIS — M5410 Radiculopathy, site unspecified: Secondary | ICD-10-CM | POA: Insufficient documentation

## 2020-09-13 DIAGNOSIS — M5136 Other intervertebral disc degeneration, lumbar region: Secondary | ICD-10-CM | POA: Insufficient documentation

## 2020-09-13 DIAGNOSIS — G894 Chronic pain syndrome: Secondary | ICD-10-CM | POA: Insufficient documentation

## 2020-09-13 DIAGNOSIS — M545 Low back pain, unspecified: Secondary | ICD-10-CM | POA: Insufficient documentation

## 2020-09-13 DIAGNOSIS — M7918 Myalgia, other site: Secondary | ICD-10-CM | POA: Insufficient documentation

## 2020-09-13 DIAGNOSIS — M792 Neuralgia and neuritis, unspecified: Secondary | ICD-10-CM | POA: Insufficient documentation

## 2020-09-13 DIAGNOSIS — G8929 Other chronic pain: Secondary | ICD-10-CM | POA: Insufficient documentation

## 2020-09-13 NOTE — Progress Notes (Signed)
Review of Systems   Constitutional: Negative.    HENT: Negative.    Eyes: Negative.    Respiratory: Negative.    Cardiovascular: Negative.    Gastrointestinal: Positive for diarrhea.   Endocrine: Negative.    Genitourinary: Negative.    Musculoskeletal: Positive for arthralgias, back pain, gait problem, myalgias, neck pain and neck stiffness.   Skin: Negative.    Allergic/Immunologic: Negative.    Neurological: Positive for weakness and numbness.   Hematological: Negative.    Psychiatric/Behavioral: Positive for sleep disturbance.

## 2020-09-13 NOTE — Progress Notes (Signed)
CHIEF COMPLAINT:   Chief Complaint   Patient presents with    Low Back Pain    Leg Pain     right    Foot Pain     right    Shoulder Pain    Neck Pain       HISTORY OF PRESENT ILLNESS:  The purpose of today's visit is interval reevaluation of low back and right hip pain. The patient has a pain history significant for Right Hip OA, R greater trochanter bursitis, L4-5 L5-S1 Facet arthropathy, Radiculopathy, and Myofascial pain. . The patient was last seen by Dr. Araceli Mason on 07/05/20 at which time we recommended a short course of meloxicam (which was subsequently refilled), consideration for caudal epidural steroid injection.       Since last visit the patient reports his right hip pain has improved since his last visit. He feels this has been improved by ultrasound treatments and massage therapy. Currently rated around 0/10, only hurts with continuous pressure on that side.     He feels current worst pain is in his low back, which alternates approximately weekly between left and right being the most significant.   He rates this around 7-8/10 in severity and it is described as "stabbing." He feels this is similar in character to his prior low back pain, though more frequent. Pain is worse with twisting or turning, walking, standing (10 minutes). The pain is improved by ice, sometimes heat. He feels his overall pain level is stable or slightly worsened.     Right foot surgery 2 days prior, second digit tendon release. He reports post-operative pain since that time.     He has not currently working with PT, d/t scheduling issues and does not engage in home EP. He spends the majority of his time in a reclining chair.     He works with a Teacher, adult education currently, most recently 2 days ago. Feels this has been beneficial, pain relief lasts for a few days. He is using a TENS unit frequently, which he finds helpful. Using tylenol 4 pills (500mg ) on average, not taking ibuprofen, not taking meloxicam. Pregabalin taking  300mg  BID-possibly helpful, duloxetine 60mg  BID.    He is still participating in the EtOH treatment with Ronnie Mason. His current goal is to cut down to drinking 2 glasses of wine daily (currently 2-3 glasses daily).     Labs/Imaging Reviewed:  New new pertinent labs or imaging since last evaluation.         REVIEW OF SYSTEMS:  The review of systems documented by our clinic staff was provided by the patient. I reviewed and confirmed these details with and have no further detail to add.    PHYSICAL EXAMINATION:  BP (P) 128/79    Pulse (P) 88    Temp (P) 36.3 C (Temporal)    Ht (P) 6\' 4"  (1.93 m)    Wt (!) (P) 108.9 kg (240 lb) Comment: per pt   SpO2 (P) 95%    BMI (P) 29.21 kg/m    Constitutional:Constitutional: Adult male, NAD   Skin, exposed parts: color normal  Eyes: Negative for miosis bilaterally. Pupilsappropriate for room lighting. Extraocular movements areintact bilaterally  Cardiovascular: No cyanosis, no dilation of jugular veins, no dyspnea. Bilateral radial pulses areintact and regular   Respiratory: Respirations areunlabored. No accessory muscle use noted  Gastrointestinal : Abdomen is protuberant  MSK: Reproducible tenderness to palpation in bilateral lumbar paraspinals, pain with flexion, extension.   Neurologic: Oriented to  person place time situation. Language fluent and coherent, comprehension intact. Moves all extremities with anti-gravity strength  Psychiatric: Judgement/insight are limited. Mood is fair. Affect is congruent    IMPRESSION:   Ronnie Mason is a 70 year old male with:  1. Right hip pain, Xrays from 2018 reviewed showing severe OA.This has largely resolved currently, minimal to no pain in this area at present.   2. Axial low back painsecondary tofacet arthropathy at L4-L5,L5-S1 with prior positive response to RFA(last in 2017) as well as lumbar facet arthropathy. Continue to suspect a significant component of myofascial pain given exacerbation with movement,  reproducible tenderness to palpation. Also concern for nociplastic pain/central sensitization overlying multiple somatic pain issues given diffuse pain symptoms and probably hyperalgesia.   3. Lower back pain with radiation into bilateral lower extremities radicular in nature. Notes less radicular pain at this time, primarily axial in nature, however does have prior evidence of disc bulge on imaging.     4. Deconditioning- Recent right foot surgery. PT currently on hold.     5. Prior treatment failures including:Short duration of benefit forinterlaminar lumbarepidural steroids and TFESI, limited benefit to lumbar MBB recently, memantine, Naltrexone.     6. Multiple prior injections by outside providers(exluding MBB and trigger point injections):   Dr. Gustavus Mason  ? 03/23/2018 -LEFT L5 TFESI with 80-85%relief for about 3-4 weeksof just axial back pain.  ? 09/15/2017 -Bilateral L5TFESI with 50-75% reliefof mixture of low back and leg pain.  ? 05/19/2017 -RIGHT L5 TFESIwith 100% relief ofRadicularsymptoms for about 3 months  ? 08/12/2016 - L5-S1 ILESI  ? 07/01/2016 - Ultrasound guided right trochanteric bursa injection   ? 05/20/2016 - Bilateral shoulder injections  ? 05/13/2016 - Right L3 L4 L5/S1 RFA   ? 02/19/2016 - Left L3, L4, L5/S1 RFA   ? 12/27/2012 - L5-S1 IL ESI  ? 08/31/2012 - Left L5 TFESI  ? 03/30/2012 - Left L4/5, L5/S1 facet inj  ? 02/10/2012 - Left C6 C7 MB RFA  ? 07/03/2011 - Left L3, L4, L5/S1 RFA  ? 05/12/2011 - Left C4, C5, C6 RFA    7. 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  [name] had no further questions.    Diagnostic evaluation:     Rehabilitation:   1. Consider PT for dedicated low back pain.  2. Recommend graded return to physical activity. Discussed implementation.     Medication suggestions:   1. Consider Boswellia 800mg  TID for anti-inflammatory properties  2. Would not recommend continuous Meloxicam use from a pain perspective at this time given medical co-morbidities and risk of GIB. Would defer to PCP if they are comfortable monitoring with long term meloxicam use.   3. Agree with Acetaminophen, with max 2g/day for arthritic component to pain   4. Recommend diclofenac topical gel QID to low back    Procedure suggestions:   1. Plan for series of trigger point injections, plan to add steroid    Follow-up: For procedure    , MD  Chronic Pain Fellow

## 2020-09-13 NOTE — Patient Instructions (Addendum)
Nice to see you again.  A copy of the draft of our note:    Rehabilitation:   1. Consider PT for dedicated low back pain  2. Recommend graded return to physical activity.     Medication suggestions:   1. Consider Boswellia 800mg  TID  2. Would not recommend continuous Meloxicam use from a pain perspective at this time given medical co-morbidities and risk of GIB. Would defer to PCP (Dr. ), if they are able/comfortable with long term meloxicam use.   3. Agree with Acetaminophen, with max 2g/day for arthritic component to pain   4. Recommend diclofenac topical gel QID to low back      Procedure suggestions:   1. Plan for series of trigger point injections, plan to add steroid    Follow-up: For procedure    Please update PainTracker at [email or web address] prior to your next visit to the [facility].   For log in assistance or to reset your PainTracker, please call clinic at [phone], #[phone].

## 2020-09-13 NOTE — Progress Notes (Signed)
I saw and evaluated the patient with Dr. Alford Highland, who conducted the initial history.  I reviewed and confirmed the history in detail with Mr. Eads in person.   I was present for the examination and formulation portions of the encounter. I agree with the findings and the plan of care as documented in our notes. I did edit the trainee's note.      [name], MD  Assistant Professor  [facility]

## 2020-09-18 ENCOUNTER — Telehealth (INDEPENDENT_AMBULATORY_CARE_PROVIDER_SITE_OTHER): Payer: Self-pay | Admitting: Internal Medicine

## 2020-09-18 ENCOUNTER — Ambulatory Visit (INDEPENDENT_AMBULATORY_CARE_PROVIDER_SITE_OTHER): Payer: Medicare PPO | Admitting: Physician Assistant

## 2020-09-18 ENCOUNTER — Encounter (INDEPENDENT_AMBULATORY_CARE_PROVIDER_SITE_OTHER): Payer: Self-pay | Admitting: Physician Assistant

## 2020-09-18 VITALS — BP 130/90 | Temp 96.8°F

## 2020-09-18 DIAGNOSIS — M205X1 Other deformities of toe(s) (acquired), right foot: Secondary | ICD-10-CM

## 2020-09-18 NOTE — Telephone Encounter (Signed)
RETURN CALL: Voicemail - Detailed Message - [phone] or [phone]    SUBJECT: Medication Questions     NAME OF MEDICATION(S): something for right ear infection  CONCERNS/QUESTIONS: has been having symptoms of ear infection for about a week, would like something prescribed for it. States he had some sort of drops prescribed several years ago that worked, but he doesn't remember the name of them.  ADDITIONAL INFORMATION: If able to prescribe, please send to:    [facility] [id number] [address] [place] [phone]   [address] [place], [place] [address]   Phone:  [phone] Fax:  [phone]     Please notify once sent.

## 2020-09-18 NOTE — Progress Notes (Signed)
Post op tenotomy procedure 09/10/20  Removed dressing 09/13/20. No pain meds needed   WB with no difficulties.

## 2020-09-18 NOTE — Telephone Encounter (Signed)
Spoke with Pt, scheduled OV.     Nothing further needed, closing encounter.

## 2020-09-18 NOTE — Progress Notes (Signed)
Ronnie Mason  [id number]    POST-OP VISIT      PROCEDURES:  Percutaneous flexor digitorum longus at distal interphalangeal joint level on second toe, right    DOS: 09/10/20    LATERALITY: RIGHT    PO WEEK #: 1 week, 1 day    SURGEON: Dr. Hilarie Fredrickson, DPM    Patient is accompanied to today's appointment by: self    SUBJECTIVE:  Ronnie Mason returns today for his 1 week post-procedure evaluation. Overall, He has been feeling well since the procedure on his right second toe. He has been using a toe splint at the top and bottom of his second toe to protect the toe from rubbing the inside of his shoes.     Pain level:  0/10    Post-op pain medications & schedule:  acetaminophen for his chronic back pain   DVT prophylaxis:  none   Weight-bearing status:  full weightbearing in shoes   Falls/mishaps:  none         ROS:  Constitutional: denies fatigue, fever, chills, headache  Respiratory: denies shortness of breath  Cardiovascular: denies chest pain, chest tightness  GI: denies nausea, vomiting, constipation, diarrhea   MSK: denies falls/mishaps, denies calf pain, groin pain/tenderness  Skin: denies rashes, itching, bleeding/discharge from suture sites         OBJECTIVE:  BP 130/90    Temp 36 C (Temporal)    SpO2 97%     Habitus: Well developed. Well nourished. There is no height or weight on file to calculate BMI.  Level of comfort: sitting up on exam table, in no apparent distress, alert and oriented to person, place and time.   Psych: appropriately sociable and reciprocal, normal mood, good eye contact  LOWER EXTREMITIES:   Vascular: warm and well perfused. Pedal pulses 2+. Toes blanch with pressure, capillary refill time brisk. +mild edema right second toe   Dermatological: normal texture/turgor. Incision site healed; difficult to locate due to very small size of incision on medial side of right second toe. No visual evidence of infection.    Neurologic: no hypersensitivity to light touch throughout bilateral lower  extremities. +baseline RLE neuropathy/diminished sensation.   Musculoskeletal: no calf swelling or tenderness bilaterally.         ASSESSMENT:    1 weeks, 1 day s/p above procedure and overall doing well.     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     Weight bearing status/ activity restrictions:      Continue full weightbearing in shoes.       Physical therapy:    Not required.     DVT prophylaxis:     None; patient is ambulating.           Pain management:   May use acetaminophen/ibuprofen for pain and swelling.  For acetaminophen: avoid consuming alcohol, do not take more than 4,000 mg in 24 hours.  For ibuprofen: take with food, do not take more than 2,400 mg in 24 hours  You should not use ibuprofen if you have:  A bleeding disorder, kidney disease, GI/stomach issues, liver disease.     Return/follow-up:  Discharged; follow-up as needed.  All the patient's questions were answered. Communication was emphasized to the patient to call the office immediately if there is any worsening of their current status.      No orders of the defined types were placed in this encounter.      There are no Patient Instructions on file for this visit.          Julieanne Manson, PA-C  Teaching Associate  Department of Orthopaedics and [place] [facility] [place]

## 2020-09-19 ENCOUNTER — Other Ambulatory Visit (INDEPENDENT_AMBULATORY_CARE_PROVIDER_SITE_OTHER): Payer: Self-pay | Admitting: Internal Medicine

## 2020-09-19 ENCOUNTER — Ambulatory Visit (INDEPENDENT_AMBULATORY_CARE_PROVIDER_SITE_OTHER): Payer: Medicare PPO | Admitting: Internal Medicine

## 2020-09-19 ENCOUNTER — Other Ambulatory Visit (HOSPITAL_BASED_OUTPATIENT_CLINIC_OR_DEPARTMENT_OTHER): Payer: Self-pay | Admitting: Anesthesiology

## 2020-09-19 VITALS — BP 152/94 | HR 82 | Temp 97.5°F | Resp 14

## 2020-09-19 DIAGNOSIS — H938X3 Other specified disorders of ear, bilateral: Secondary | ICD-10-CM

## 2020-09-19 DIAGNOSIS — G250 Essential tremor: Secondary | ICD-10-CM

## 2020-09-19 DIAGNOSIS — H60501 Unspecified acute noninfective otitis externa, right ear: Secondary | ICD-10-CM

## 2020-09-19 DIAGNOSIS — M48061 Spinal stenosis, lumbar region without neurogenic claudication: Secondary | ICD-10-CM

## 2020-09-19 DIAGNOSIS — E785 Hyperlipidemia, unspecified: Secondary | ICD-10-CM

## 2020-09-19 MED ORDER — ATORVASTATIN CALCIUM 10 MG OR TABS
10.0000 mg | ORAL_TABLET | Freq: Every day | ORAL | 1 refills | Status: DC
Start: 2020-09-19 — End: 2021-03-04

## 2020-09-19 MED ORDER — OFLOXACIN 0.3 % OT SOLN
5.0000 [drp] | Freq: Two times a day (BID) | OTIC | 0 refills | Status: DC
Start: 2020-09-19 — End: 2020-12-03

## 2020-09-19 NOTE — Progress Notes (Signed)
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 and past intraparenchymal hemorrhage/ traumatic brain injury       Patient Care Team:   [name], MD as PCP - General (Internal Medicine)   [name], MD (Internal Medicine)   Ward Chatters, MD as Cardiologist  (Cardiology)   Geri Seminole, MD (Sports Medicine)   Holli Humbles, DPM (Podiatry)   Dixon Boos, MD (Orthopedic Surgery)   Clydene Pugh, MD (Gastroenterology)   Casandra Doffing, MD (General Surgery)   Reggie Pile, ARNP (Urology)   Hilarie Fredrickson Dong-Hyun, DPM (Podiatry)   Renea Ee, MD as Primary Contact (Pain Management)   Drenckpohl, Rolan Bucco, RN as Registered Nurse (Nursing)       Today follow-up above issues:     C/o right ear pain x 1 wk and muffed ears x 1 d, no F chills or sore throat or sinus congestion    Has been putting hydrocortisone creamin ear, bu the said in past he aid saw someone at [facility] in dec and they gave him ear drops not seeing thi sin chart    No known covid exposure   He said  Tried to get in to Dr Juel Burrow but no appointment so he did not make one        Orders Only on 06/11/20   1. Chr Pain Drug Rsk1 w/Cnslt, URN   Result Value Ref Range    Expected Drugs, URN SEE NOTES     Drug Screen Interp Comment, URN (NOTE)     Cocaine (Qual), URN Negative NRN    Opiates (Qual), URN Negative NRN    Amphet/Methamphetamine Qual,URN Negative NRN    Cannabinoids (Qual), URN Negative NRN     Barbiturate (Qual), URN Positive (A) NRN    Benzodiazepines (Qual), URN Negative NRN    Methadone (Qual), URN Negative NRN    Oxycodone (Qual), URN Negative NRN    Buprenorphine Qualitatiave, URN Negative NRN    Tramadol Qual, URN Negative NRN    Fentanyl Qual, URN Negative NRN    Drug Screen Test Info, URN SEE NOTES        No results found.    MEDICATION PRIOR TO VISIT  Reports   Outpatient Medications Prior to Visit   Medication Sig Dispense Refill    Acetaminophen 500 MG Oral Tab 2 tablets bid      Alpha-Lipoic Acid 300 MG tablet Take 1 tablet by mouth daily.      amLODIPine 5 MG tablet TAKE ONE TABLET BY MOUTH TWICE DAILY 180 tablet 3    BENFOTIAMINE OR Take 250 mg by mouth daily.      Cholecalciferol (VITAMIN D3) 2000 units Oral Cap Take 1 capsule (2,000  Units) by mouth daily. For low level 1 capsule 1    Cyanocobalamin 1000 MCG Oral Tab one per day over-the-counter started 5/21 /2012 this level borderline low 1 Tab 1    diclofenac 1 % gel Apply 4 g topically 4 times a day. Apply to 2 gram four times daily to neck, trapezius muscle, and low back. Use a max of 32 grams a day. 300 g 4    diclofenac 1 % gel       DULoxetine 60 MG DR capsule TAKE 1 CAPSULE BY MOUTH TWICE DAILY 180 capsule 2    EPINEPHrine 0.3 MG/0.3ML auto-injector Inject as instructed per patient package insert, 0.3 mg intramuscularly or subcutaneously into the thigh, if needed to treat anaphylaxis 4 each 1    IBUPROFEN OR Take by mouth.      lidocaine 5 % patch Apply 1 patch onto the skin daily. Apply to painful area for up to 12 hours in a 24 hour period. 30 patch 5    lisinopril 20 MG tablet Take 1 tablet (20 mg) by mouth every 12 hours. 180 tablet 1    meloxicam 7.5 MG tablet Take 1 tablet (7.5 mg) by mouth daily as needed for pain for up to 14 days. For pain/inflammation. Take with food. 7 tablet 0    naltrexone 380 MG injection Inject 380 mg intramuscularly every 28 days.      pregabalin 300 MG capsule take 1 capsule by  mouth twice a day 60 capsule 2    primidone 50 MG tablet Take 1 tablet (50 mg) by mouth 2 times a day. 180 tablet 1    thiamine 100 MG tablet Take 1 tablet by mouth daily.       triamcinolone 0.1 % cream As needed to rear bid x 1 week 15 g 1     Facility-Administered Medications Prior to Visit   Medication Dose Route Frequency Provider Last Rate Last Admin    naltrexone (Vivitrol) injection 380 mg  380 mg Intramuscular q21 days Rockne Menghini, MD   380 mg at 09/04/20 1000       REVIEW OF SYSTEMS -   Not reviewed if not marked   P=Positive  N=Negative      P   N    []  [x]  Constitutional:   []  []  Eyes:    []  []  Head ears, nose mouth throat:  []  []  Cardiovascular:    []  [x]  Respiratory:     []  [x]  Gastrointestinal:     []  []  Genitourinary:      []  []  Musculoskeletal:      []  []  Integumentary:     []  []  Neurological:     []  []  Psychiatric/Behavioral:      []  []  Endocrine:     []  []  Hematological:     []  []  Allergic/Immunologic:      []  []  Breast:   []  []  Male: GU /testicular:       Alcohol is at 2 per day       PHYSICAL Exam    BP (!) 152/94    Pulse 82    Temp 36.4 C (Temporal)    Resp 14    SpO2 98%     Physical Exam    In nad  Ear tender to lobe on right when pulled , no color change no rash  Ear lobe both sides look slightly greasy  Can not see tm well w PPE on     Nasal covid pcr obatined  No gross adrenopathy    IMPRESSION / PLAN / DISCUSSION   (H60.501) Acute otitis externa of right ear, unspecified type  (primary encounter diagnosis)  (H93.8X3) Congestion of both ears    Ronnie Mason was seen today for ear pain and medication management.    Diagnoses and all orders for this visit:    Acute otitis externa of right ear, unspecified type  -     ofloxacin 0.3 % otic solution; Place 5 drops into right ear 2 times a day. 7 days    Congestion of both ears  -     COVID-19 Coronavirus Qualitative PCR; Future    Other orders  -     ZEBRA LABELS          w contestion of ears and muffled , and ear pain ,  will check covid  No contact script pick up    Topical antibiotcs and follow up ent    Also   As for the back and DJD as it is not autoimmune arthritis clinic will not see him  He is encouraged to keep mobility     Defer pain contract and pain follow up today and will ask for telemed in 1 mo   On other health management  issues     Discussed hm due along with other issues above   Health Maintenance Topics with due status: Overdue       Topic Date Due    Zoster Vaccine 07/22/2020     Health Maintenance Topics with due status: Due Soon       Topic Date Due    Lipid Disorders Screening 02/09/2021         No follow-ups on file.    Patient   express understanding of care plan/medications  Risk of taking medication properly and follow up discussed especially to call if problem    Created with the assistance of voice recognition software dictation

## 2020-09-20 LAB — COVID-19 CORONAVIRUS QUALITATIVE PCR: COVID-19 Coronavirus Qual PCR Result: NOT DETECTED

## 2020-09-20 NOTE — Result Encounter Note (Signed)
Negative covid  If not better see Dr Levon Hedger the ear is feeling better

## 2020-09-21 MED ORDER — PRIMIDONE 50 MG OR TABS
50.0000 mg | ORAL_TABLET | Freq: Two times a day (BID) | ORAL | 0 refills | Status: DC
Start: 2020-09-21 — End: 2020-12-26

## 2020-09-25 ENCOUNTER — Ambulatory Visit: Payer: Medicare PPO | Attending: Psychiatry

## 2020-09-25 ENCOUNTER — Encounter (HOSPITAL_BASED_OUTPATIENT_CLINIC_OR_DEPARTMENT_OTHER): Payer: Self-pay

## 2020-09-25 DIAGNOSIS — F1010 Alcohol abuse, uncomplicated: Secondary | ICD-10-CM | POA: Insufficient documentation

## 2020-09-25 DIAGNOSIS — F1020 Alcohol dependence, uncomplicated: Secondary | ICD-10-CM | POA: Insufficient documentation

## 2020-09-25 NOTE — Progress Notes (Signed)
09/25/2020    Ronnie Ahr" Mason is a 70 year old male here for his naltrexone XR injection. Ronnie Mason gets Naltrexone XR every 3 weeks. He does not know if he is getting any effect from the medication. Reports his alcohol use has reduced, but he is not sure if that is attributable to the pharmacotherapy or other behavioral changes that he has made. Will schedule follow up with Dr [name] to discuss.     380mg  Naltrexone XR administered to R dorsogluteal. Ronnie Mason tolerated well with no ill effects. He will monitor the injection site for s/sx of infection.      Prefers dorsogluteal injections to right side, okay with ventrogluteal or dorsogluteal to left side.     Next injection due 10/16/2020.

## 2020-10-08 ENCOUNTER — Ambulatory Visit (INDEPENDENT_AMBULATORY_CARE_PROVIDER_SITE_OTHER): Payer: Medicare PPO | Admitting: Otolaryngology

## 2020-10-08 ENCOUNTER — Encounter (INDEPENDENT_AMBULATORY_CARE_PROVIDER_SITE_OTHER): Payer: Self-pay | Admitting: Otolaryngology

## 2020-10-08 VITALS — BP 136/88 | HR 90

## 2020-10-08 DIAGNOSIS — H608X1 Other otitis externa, right ear: Secondary | ICD-10-CM

## 2020-10-08 NOTE — Progress Notes (Signed)
OFFICE VISIT      PATIENT:  Ronnie Mason  DOB:  09/27/1950  MRN:  [id number]      CHIEF COMPLAINT/HPI:  Patient is a 70 year old male who presented with right itchy ear for 5 years, onset 2017.  Symptoms are moderate, continuous, and unchanged.  It is located lateral to the ear canal.  There is dermatologist prescribed betamethasone lotion for his scalp.  He is using OTC hydrocortisone for his ear.  He denies otalgia, otorrhea, vertigo, tinnitus, and hearing loss.  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  LV 07/2017 otitis externa.    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 :       PMHX:  Past Medical History:   Diagnosis Date    Pain of right hip joint 01/23/2016    Intraparenchymal hemorrhage of brain (HCC) 2017    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 myelopathy or radiculopathy 05/28/2014    Carotid Sinus Hypersensitivity     Disc disorder of lumbosacral region     Hip injury     HYPERTENSION      HYPOTENSION      Syncope     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(HCC)     URI (upper respiratory infection)      PSHX:  Past Surgical History:   Procedure Laterality Date    L meniciscus Left 07/2017    by Dr Marcelle Overlie , re did left menisicus     PR ANES; COLONOSCOPY  2002    repeat in 3 years    PR ANES; COLONOSCOPY & POLYPECTOMY  11/18/2007    repeat in 3 years    PR HALLUX RIGIDUS W/CHEILECTOMY 1ST MP JT W/O IMPLT Right 10/21/2017    Dr. Donn Pierini    PR UNLISTED PROCEDURE FEMUR/KNEE      PR UNLISTED PROCEDURE HANDS/FINGERS      PR UNLISTED PROCEDURE SPINE  2013    rfa to c3 to c 7     TOE SURGERY Right 02/09/2019    toe surgery  Right 01/12/2019    Dr Hilarie Fredrickson      FHX:  @HXFAMILY @  SHX:  Social History     Socioeconomic History    Marital status: Married     Spouse name: Not on file    Number of children: Not on file    Years of education: Not on file    Highest education level: Not on file   Occupational History    Not on file   Tobacco Use    Smoking status: Never Smoker    Smokeless tobacco: Former   Substance and Sexual Activity    Alcohol use: Yes     Alcohol/week: 14.0 standard drinks     Types: 14 Standard drinks or equivalent per week     Comment: drinks a night max    Drug use: Yes     Types: Marijuana     Comment: CBD and THC    Sexual activity: Not on file   Other Topics Concern    Not on file   Social History Narrative    09/06/18- married 41 years, 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  Non-smoker, 14 or more drinks/week almost exclusively wine with dinner.            Social Determinants of Health     Financial Resource Strain:     Difficulty of Paying Living Expenses: Not on file   Food Insecurity:     Worried About Programme researcher, broadcasting/film/video in the Last Year: Not on file    [employer] of Food in the Last Year: Not on file   Transportation Needs:     Lack of Transportation (Medical): Not on file    Lack of Transportation (Non-Medical): Not on file   Physical Activity:     Days of Exercise per Week: Not on file    Minutes of Exercise per Session: Not on file   Stress:     Feeling of Stress : Not on file   Social Connections:     Frequency of Communication with Friends and Family: Not on file    Frequency of Social Gatherings with Friends and Family: Not on file    Attends Religious Services: Not on file    Active Member of Clubs or Organizations: Not on file    Attends Banker Meetings: Not on file    Marital Status: Not on file   Intimate Partner Violence:     Fear of Current or Ex-Partner: Not on file    Emotionally Abused: Not on file    Physically Abused: Not on file    Sexually Abused: Not on file   Housing  Stability:     Unable to Pay for Housing in the Last Year: Not on file    Number of Places Lived in the Last Year: Not on file    Unstable Housing in the Last Year: Not on file       ALLERGIES:  is allergic to adhesives, bee venom, gabapentin, and methocarbamol.    MEDICATIONS:  has a current medication list which includes the following prescription(s): acetaminophen, alpha-lipoic acid, amlodipine, atorvastatin, benfotiamine, vitamin d3, cyanocobalamin, diclofenac, diclofenac, duloxetine, epinephrine, ibuprofen, lidocaine, lisinopril, meloxicam, naltrexone, ofloxacin, pregabalin, primidone, thiamine, and triamcinolone, and the following Facility-Administered Medications: naltrexone.      PHYSICAL EXAMINATION:  Vitals:    10/08/20 1029   BP: 136/88   BP Cuff Size: Regular   BP Site: Left Arm   BP Position: Sitting   Pulse: 90   SpO2: 95%        General: Well developed, appearing stated age and in no acute distress.  Participated in the patient interview appropriately.  Eyes: Clear conjunctive, normal lids. Pupils equal, round and reactive to light.  Extraocular movements intact.  Ears: visualized by microscopy.               Right: Pinna with mild dry flaky skin and scant blood crusts.  External ear canal without masses or skin abnormality.  No cerumen. Tympanic membrane intact and mobile without retraction or perforation. Middle ear without fluid. No hearing loss.                   Left: Pinna unremarkable.  External ear canal without masses or skin abnormality. No cerumen. Tympanic membrane intact and mobile without retraction or perforation. Middle ear without fluid. No hearing loss.      Nose:  Nasal mucosa normal with clear mucus.  Midline anterior septum with no polyps or lesions.  Mild turbinate hypertrophy.  OP: Clear without masses or ulcerations. Normal lips and gums.  Normal hard palate, soft  palate, and posterior pharynx. No parotid or submandibular gland tenderness, masses, or swelling.    HP:  Clear  without mucosal abnormalities or masses  Neck: Supple and without masses, midline trachea, normal thyroid without enlargement, tenderness, and masses.  Lymphatic: normal submandibular, cervical, and supraclavicular lymph nodes    Skin: No rashes   Neuro:  CN II-XII grossly intact (VII Movements of facial expression bilaterally intact and symmetrical)  Cardiovascular: Regular rate and rhythm, normal carotid pulses  Lungs: normal effort  Extremities: No edema                  IMPRESSION/PLAN: AD chronic otitis externa  May try betamethasone lotion on pinna for itchy ear      Doreene Nest, MD      Cc:   Judi Saa Viseskul (General[place]  [address]  [place], [place] [address]

## 2020-10-08 NOTE — Patient Instructions (Signed)
Thank you for choosing us at [facility] to be a part of your health care team!  We care about your experience here and want your journey to be as frictionless as possible.  Your feedback is very important to our practice. Should you receive a [facility] patient satisfaction survey, please let us know how we are doing.  [facility] accepts 9-10 as OK and satisfied, 1-8 as no credit.  It is our goal to serve you with the highest quality care.                                          Thank you, Sarah & Dr. Alleyah Twombly  :)        Itchy Ears    Itchy ears are a very common problem. For some people the problem is so bad that they stick various objects into the ears, causing trauma to the ear canal. The most common causes of itching are a nervous habit, fungal infection or the beginning of an infection. Other causes can be skin diseases such as psoriasis or dermatitis. Some people with allergies complain of Itchy ears.    The ear canal may be normal on examination or there may be scaling of the skin. People aggravate the problem by using things such as bobby-pins, coat hangers and tooth picks to scratch the ear. This can produce abrasions of the ear canal.  Any break in the skin can allow bacteria to enter through this protective barrier. The ear will then become infected requiring it to be treated the same as swimmer’s ear.    Itching by itself without evidence of trauma or infection can be treated with a mild steroid ear drop.  A few drops placed in the ear will help to decrease the amount of itching. Another helpful treatment is the use of 70% alcohol (rubbing alcohol) as an ear drop. CAUTION: itchy ears may be the first signal that an infection is developing. If the ear is infected the alcohol will burn.

## 2020-10-16 ENCOUNTER — Ambulatory Visit (HOSPITAL_BASED_OUTPATIENT_CLINIC_OR_DEPARTMENT_OTHER): Payer: Medicare PPO

## 2020-10-16 ENCOUNTER — Ambulatory Visit: Payer: Medicare PPO | Attending: Psychiatry | Admitting: Addiction (Substance Use Disorder)

## 2020-10-16 DIAGNOSIS — F1020 Alcohol dependence, uncomplicated: Secondary | ICD-10-CM | POA: Insufficient documentation

## 2020-10-16 DIAGNOSIS — F1010 Alcohol abuse, uncomplicated: Secondary | ICD-10-CM

## 2020-10-16 NOTE — Progress Notes (Signed)
[facility]  1:1 Session Note    Problem #: 1,2,3,4   Time: 60 Minutes  Notes: D) Patient had his vivitrol shot today. Patient said that his drinking has cut down over the past month quite a bit. He said a couple glasses of wine a few nights a week. He credits less drinking with being busy. Patient stated that he would like to eventually get off the shot- we discussed the pros/cons of this. He will discuss this with his psychiatrist at his next appt. He said that he will be returning to the gym today. Patient appears to be doing well.   A) Patient appeared open and honest. P) Attend all appointments.              [name] M.A. CDP

## 2020-10-16 NOTE — Progress Notes (Signed)
09/25/2020    Ronnie Mason is a 70 year old male here for his naltrexone XR injection. Ronnie Mason has been getting Naltrexone XR every 3 weeks. Reports his alcohol use has reduced; currently drinking 2 glasses a wine per night, most nights of the week. Denies cravings. Goal is to drink no more than 2 glasses of wine per night.     He does not know if he is getting any effect from Naltrexone, would like to try extending injections to every 4 weeks to see if he notices a change. Long term he would like to stop with the medication.      380mg  Naltrexone XR administered to L dorsogluteal. Ronnie Mason tolerated well with no ill effects. He will monitor the injection site for s/sx of infection.      Prefers dorsogluteal injections to right side, okay with ventrogluteal or dorsogluteal to left side.     Next injection due 11/13/2020, 4 weeks out

## 2020-10-18 ENCOUNTER — Ambulatory Visit: Payer: Medicare PPO | Attending: Anesthesiology | Admitting: Anesthesiology

## 2020-10-18 VITALS — BP 130/84 | HR 79 | Temp 96.6°F | Resp 16

## 2020-10-18 DIAGNOSIS — M7918 Myalgia, other site: Secondary | ICD-10-CM | POA: Insufficient documentation

## 2020-10-18 NOTE — Progress Notes (Signed)
PROCEDURE NOTE: TRIGGER POINT INJECTIONS UNDER ULTRASOUND GUIDANCE    PROCEDURE  Trigger point injection x 14 to the bilateral  Lower back  Procedure peformed by: Resident physician and Attending    PRE-PROCEDURE DIAGNOSIS  (M79.18) Myofascial pain  (primary encounter diagnosis).    POST-PROCEDURE DIAGNOSIS  (M79.18) Myofascial pain  (primary encounter diagnosis).    INDICATION FOR INJECTION  Myofascial pain of the lower back    ATTENDING PHYSICIAN:  [name], MD    FELLOW / RESIDENT:  Jake Seats    SEDATION:  None    NPO STATUS:  None    INTRAVENOUS LINE: None    Guidance: Ultrasound guidance used for this procedure, appropriate images viewed and saved to the medical record    DESCRIPTION OF THE PROCEDURE  Position: Sitting  Monitoring: Pulse oximetry    Final Verification/Time-out: Performed and documented.  Preparation: The injection area was prepped with Chloraprep.  Imaging for needle placement: Transducer placed over the  region.  Needle insertion: Out-of plane.  Imaging conditions: Good visualization of the muscle planes.  Needle used: 25g, 2 inch.  At each trigger point, 1.4 ml of an injectate consisting of Bupivicaine 0.25% was injected for a total of 20 mL.    The following muscles were injected:      [x]  bilateral quadratus lumborum   [x]  bilateral erector spinae   [x]  bilateral multifidus at spinal level L4, L5  Bilateral gluteus medius and maximum     The needle was removed uneventfully and hemostasis was maintained.     POST-PROCEDURE:  He was escorted to the recovery area in stable condition where he made an uneventful recovery.    COMPLICATIONS:  none     PLAN  Return for remaining series of trigger point injections

## 2020-10-18 NOTE — Progress Notes (Signed)
Ronnie Mason identified by name and DOB.   He confirmed he is having a bilateral lower back TPI, 1 of 4  He ambulated to procedure area with steady gait.  He has not fallen in the last 6 months.  Fall prevention interventions: Patient provided with non-skid stockings  He denies taking any blood thinners.  He denies having a bleeding disorder.  He has held his medications per provider instructions.  He has not been on antibiotics for the past two weeks.   He denies a history of fainting during medical procedures.   He has not been NPO.  He undressed self without assistance.  Is he receiving a steroid injection today?   If yes, has He received the COVID-19 vaccine (either brand) within the past 2 weeks? No  OR does He plan to receive the COVID-19 vaccine (either brand) within the next 2 weeks? No  I verbally reviewed written discharge instructions and pain diary with him.   Confirmed ride home with his wife Bonita Quin, in [place], [phone]     He has a pre-procedure pain score of 2/10 bilateral lower back

## 2020-10-18 NOTE — Patient Instructions (Signed)
[facility]  Post-Procedure Discharge Instructions      After the procedure:  • Immediate and complete pain relief is rare  • Numbness and/or weakness in the area of your body supplied by the injected nerve; these symptoms should resolve but may last up to several hours  • Some soreness and bruising at the injection site(s)    Activities:  • If you have any weakness or numbness caused by the injection, DO NOT DRIVE or operate machinery and limit other activity until sensation returns to normal.  • You may resume regular exercises/activities as tolerated.  • If you received sedation, DO NOT DRIVE or operate machinery for 24 hours.    Medications:  If you stopped taking any blood thinning medications such as Coumadin or Plavix, you may resume these tomorrow unless specified differently by the prescribing physician.    Site care:  • You may remove the band-aid after 6 hours.  • You may shower today. No swimming, tub baths or hot tubs for 24 hours following your procedure.  • For the first 48 hours, apply ice packs to the injection site for 15-20 minutes hourly as needed for comfort.  Wrap a light towel or cloth around ice packs and heating pad to protect the skin.  • After 48 hours, use a warm heating pad to the injection site for 15-20 minutes hourly as needed for comfort.    If you received steroids today:  • Steroid medications may cause facial flushing, occasional low-grade fevers, hiccups, insomnia, headaches, water retention, increased appetite, increased heart rate, and abdominal cramping or bloating. These side effects occur in only about 5 percent of patients and commonly disappear within one to three days after the injection.  • If you are diabetic check your blood sugar more frequently than usual as you may develop an increase in blood sugar for the next 10-14 days. Contact your diabetes physician if this occurs.    Call us if you develop any of the following symptoms in the next 7  days:  • Fever above 100 degrees F     • Any unusual increase in your level of pain  • Swelling, bleeding, redness, or increased tenderness at the procedure or IV site  • Headache not relieved by Tylenol (if you had an epidural steroid injection)  .    Contact us:  Anytime, 24 hours/day, 7 days/week at [phone]    [facility]  Patient Self-Administered 6 Hour Pain Diary    1.  For accuracy keep this diary with you during the 6 hour period so you can write down your pain scores as you go, at the time noted on your diary.  During the 6 hours, please document your pain scores while doing activities that provoke your pain as well as when you are not doing those activities.      2.  Within 24 hours of completing this pain score diary, please send us your pain score diary either by:  1) Scanning into [facility]  2)  Faxing to [phone] or 3)  Calling the pain score voicemail line at [phone].  Leave the spelling of your first and last name,  U#, date of birth, date of service and the 20 numbers you wrote below, including pre-procedure, immediately post procedure and % pain relief.  After that, you may leave any comments regarding your post block experience that may be helpful (e.g. was able to walk, much worse, had to take   medications).      Use this scale to rate your pain  0 1 2 3 4 5 6 7 8 9 10  No pain                                          worst pain     TIME PAIN SCORE WITH PAINFUL ACTIVITY PAIN SCORE WITHOUT PAINFUL ACTIVITY   Before injections:       Immediately after injections:       30 minutes after injections:       1 hour after injections:       2 hours after injections:       3 hours after injections:       4 hours after injections:       5 hours after injections:       6 hours after injections:       When I had the lowest pain score, my % (percentage) pain relief:          IT IS ESSENTIAL YOU CALL IN YOUR PAIN DIARY RESULTS BEFORE ANY FURTHER PROCEDURE(S) CAN BE SCHEDULED.

## 2020-10-18 NOTE — Progress Notes (Signed)
Pain score prior to procedure:  2/10  Pain score after procedure:  0/10    Mr. Paxton met discharge criteria:  A/OX4/baseline, VSS/returned to baseline, ambulatory with steady gait. Site clean dry and intact.  Written and verbal discharge instructions, including emergency contact phone numbers, reviewed with Mr. Sayres. Verbal understanding obtained from him.  Mr. Herendeen dressed self without assistance. He was discharged in stable condition, ambulatory with steady gait to home with all belongings and with ride/responsible person.

## 2020-11-06 ENCOUNTER — Ambulatory Visit (HOSPITAL_BASED_OUTPATIENT_CLINIC_OR_DEPARTMENT_OTHER): Payer: Medicare PPO

## 2020-11-13 ENCOUNTER — Ambulatory Visit (HOSPITAL_BASED_OUTPATIENT_CLINIC_OR_DEPARTMENT_OTHER): Payer: Medicare PPO | Admitting: Addiction (Substance Use Disorder)

## 2020-11-13 ENCOUNTER — Ambulatory Visit: Payer: Medicare PPO | Attending: Psychiatry

## 2020-11-13 DIAGNOSIS — F1020 Alcohol dependence, uncomplicated: Secondary | ICD-10-CM

## 2020-11-13 NOTE — Progress Notes (Signed)
11/13/2020    Ronnie Mason is a 70 year old male here for his naltrexone XR injection. Ronnie Mason has been getting Naltrexone XR every 3 weeks in the past, this is his first time getting the injection after 4 weeks. Reports he has not felt any ill effects during the last week. He is currently at his goal of drinking no more than 2 glasses of wine per night, he has been drinking 2 glasses of wine per night regularly with no significant withdrawal or cravings.     He plans to talk to counselor Ronnie Mason about stopping Naltrexone treatment after his next dose. He would like to be off of the medication.    380mg  Naltrexone XR administered to L dorsogluteal. [name] tolerated well with no ill effects. He will monitor the injection site for s/sx of infection.      Prefers dorsogluteal injections to right side, okay with ventrogluteal or dorsogluteal to left side.     Next injection due 12/11/2020, 4 weeks out

## 2020-11-14 NOTE — Progress Notes (Signed)
[facility] Addictions Program  1:1 Session Note    Problem #: 1,2,3,4   Time: 30 Minutes  Notes: D) Patient got his vivitrol shot prior to this appt. Patient is thinking of getting off the vivitrol after next months shot. We discussed the pros/cons of this. He was also given an appt with his psychiatrist to discuss this. Patient reported that he's drinking his normal amount - he stated that being busy has helped him cut down from how he used to drink. Patient stated that he has been getting regular exercise. Patient appears to be doing well.A) patient appeared open and honest during this session. P) Attend all appointments.              [name] M.A. CDP

## 2020-11-21 ENCOUNTER — Ambulatory Visit: Payer: Medicare PPO | Attending: Psychiatry | Admitting: Psychiatry

## 2020-11-21 DIAGNOSIS — F01A0 Vascular dementia, mild, without behavioral disturbance, psychotic disturbance, mood disturbance, and anxiety: Secondary | ICD-10-CM

## 2020-11-21 DIAGNOSIS — G894 Chronic pain syndrome: Secondary | ICD-10-CM | POA: Insufficient documentation

## 2020-11-21 DIAGNOSIS — F1020 Alcohol dependence, uncomplicated: Secondary | ICD-10-CM | POA: Insufficient documentation

## 2020-11-21 DIAGNOSIS — F0150 Vascular dementia without behavioral disturbance: Secondary | ICD-10-CM | POA: Insufficient documentation

## 2020-11-21 MED ORDER — NALTREXONE HCL 50 MG OR TABS
50.0000 mg | ORAL_TABLET | Freq: Every day | ORAL | 1 refills | Status: DC
Start: 2020-11-21 — End: 2021-07-04

## 2020-11-21 NOTE — Progress Notes (Signed)
[facility] ADDICTION CLINIC NOTE    DATE: 11/21/20    LOCATION OF SERVICE: In-clinic Visit   Duration  E&M (Mins): 45 minutes   Add on Psychotherapy:   Duration (Mins): 45 minutes  Type of Psychotherapy/Interventions used: Motivational Enhancement Therapy & Harm Reduction Therapy, elements of CBT for SUDs/chronic pain.     ID: Ronnie Mason is a 70 year old male who is here today for an Addiction Psychiatry appointment.     PROBLEM LIST:  Patient Active Problem List    Diagnosis Date Noted    Syncope     Hypotension     Carotid Sinus Hypersensitivity     URI (upper respiratory infection)     Macrocytosis without anaemia 02/12/2020    Myofascial pain 10/20/2019    MRSA (methicillin resistant staph aureus) culture positive 07/13/2019    Closed fracture of one rib of right side 01/26/2019     See x ray 01/26/2019   Right anterior lat sub acute chronic       Enuresis 09/06/2018    Chronic left-sided low back pain without sciatica 06/21/2018    Acute left-sided low back pain without sciatica 03/14/2018    Greater trochanteric pain syndrome of right lower extremity 11/25/2017    Complex tear of medial meniscus of left knee as current injury 09/08/2017    Primary osteoarthritis of left knee 09/08/2017    Right lumbar radiculitis 05/05/2017    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    Primary osteoarthritis of right hip 01/11/2017     Osteoarthritis of right hip x ray report - moderate to advanced radiology read 01/11/2017      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(HCC) 08/06/2016    Impaired mobility and ADLs 04/08/2016    Difficulty in walking, not elsewhere classified 03/12/2016    Vitamin D deficiency 02/13/2016    Balance problem 01/23/2016    Chronic pain of both shoulders 01/23/2016     X ray arthritis  02/19/2016        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) 01/15/2016    Intraparenchymal hemorrhage of brain (HCC) 01/06/2016    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/06/2016    Essential hypertension 12/16/2015    Essential tremor 12/16/2015    Hx of spontan intraparenchymal intracran bleed assoc with hypertension 12/16/2015     [place] - 4/29- 12/10/2015  Non-traumatic intraparenchymal hemorrhage, hypertensive of the left thalamus   Traumatic hemorrhage   -- falcine subdural hemorrhage, small cortical subarachnoid      Alcohol abuse 12/16/2015    Cerebral ventriculomegaly 10/09/2015    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    History of alcohol dependence (HCC) 06/26/2015    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) 05/07/2015    Idiopathic peripheral neuropathy 03/15/2015    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 myelopathy or radiculopathy 05/28/2014    Sensory neuropathy 01/18/2014    Tremor 04/20/2013    Fall 02/23/2013     BP < 100  Occasioanal tri[p and leg weakness      Back pain, lumbosacral 02/15/2013    Lumbar radiculopathy, chronic 02/15/2013    Neck pain 02/15/2013    Chronic pain 12/21/2012     Now at [place], DO, Santiago Glad    Neck pain -burn c3-7 on left side per patient  Some trigger injection    Lumbar pain-since 80's better with exercise      Bee sting allergy 12/21/2012     hives      Numbness and  tingling of leg 12/21/2012     Left calf -left foot long term suspect from back-suspect L 45      Chronic back pain 12/21/2012     Mri in mid scape 1/214  Multilevel degenerative disc disease of the lumbar spine. Circumferential disc bulge at all levels below and including L1-2. No significant central spinal canal stenosis or neuroforaminal narrowing.  2. Mild retrolisthesis of L5 on S1.            B12 deficiency 12/21/2012     Borderline low 5 /21/2014      Chronic renal insufficiency, stage III (moderate) (HCC) 12/21/2012     5/ 2013      Hyperlipidemia 09/26/2012    Cervicalgia 03/11/2010     Radiofrequency ablation of c3 to c 7          CHIEF COMPLAINT: AUD     INTERVAL HISTORY:  Ronnie Mason presents to clinic  on time, in NAD, for a scheduled follow-up appnt. He reports the following interval hx:    SUDs:  -AUD:   -Ronnie Mason has been drinking 1-3glasses wine QOD, which has been stable for 103mo.  (Previously, he and wife have agreed to not drink 2-3 days per week (initially one day, then adding another), now w/pt sometimes opting not to drink even if wife has a glass of wine. Reports no use of liquor in past 68mo. States that while he appreciates risks of continued use, his goal is controlled drinking at this time and feels he is approaching his goal. Is aware of NIAAA recommendations (given age and sex, </3drk/24hr, </=7/wk), which he exceeds at least by little bit now, though he plans to use this as this target. Reviewed strategies for limiting access to use (e.g., only at dinner, having only enough wine in kitchen/dining area to meet couples drinking max goals, designating sober days, mutual accountability, etc.)   - RXs: switched to XR-NTX beginning 04/11/20 (last inj on 11/13/20, intense to complete 1 more on 12/11/2020 and then discontinue); Dr. Araceli Bouche is prescribing pregabalin to 600mg /day.  Pt has stopped liquor use (last 13mo ago, one drink of bourbon with worker at his vacation home on [place]) and decreased wine use (though this remains at least slightly above intended target); wife drinks as well but is helping w/structure and monitoring.  Denies any recent falls, no w/d symptoms.     Psychiatric: Reports feeling hopeful, euthymic, without anxiety.  Has not noticed any change in memory or other cognitive function.  "I feel like I have been doing pretty well for many months now."  Has denied any significant history of psychiatric symptoms (depression, anxiety, mania/hypomania, PD, delusional thought content, ADHD); did suffer IPH several years ago with apparent neurocognitive deficits thereafter. Duloxetine previously titrated to 120mg /day (no SEs but is not convinced that he has exp significant benefit (for  pain.))    Somatic/General Medical:   -chronic pain (esp lower back) and numbness in bilat lower extremities (some mild tinglng in left shoulder, neck), benefits from episodic joint injections (likely again in next couple months), cared for by Dr. Ritaport at the [facility]; has re-joined [employer] and doing a lot of pool walking (4 days a week)   -history of multiple GLFs (through first half of 2021, none in interval period), sometimes in the context of drinking though he cites LE numbness and poor balance is being significant contributing factors.    -rx'd for HTN, HDL by PCP  -Denies new or significant  headaches, dizziness, vision changes, SOB, CP, abdominal pain or other GI distress, recent changes in urination, no new numbness/tingling/paresthesias (states normal sensation in his upper extremities is preserved)    Psychosocial: Stably housed with wife, has supportive children; retired without significant financial concerns. Freq trips to house on [place], seasonally.    Reviewed indications, relative risks & benefits, and alternatives to treatment options as well as relevant aspects of pharmacology and reviewed key elements of and expectation regarding the addictions program. Pt was given opportunities to ask and have questions answered and agrees with the treatment plan outlined below.    SUBSTANCE USE HISTORY:       -- Opioid use history: Denies      -- Alcohol: began drinking 8th grade, progressed in frequency and quantity of through college but was not function-impairing; drink wine throughout her adult life (usually with dinner, socialization), however several years ago this progressed to a fifth of burbon/day several years ago (patient states that when opioids for pain were rapidly tapered, he "turned to alcohol" to manage pain and has since lost control over use), which, following a significant fall and CNS injury, he switch to wine, drinking approximately 2 bottles/day about a year ago, with  more recent efforts to cut down again (in the context of support/encouragement from family), leading to a 1 week period of sobriety in May 2021, with subsequent return to drinking of 2 glasses with dinner (now, increased up to 1 bottle nightly by 02/2020).  Patient indicates that has been "the extent of it" though his wife indicates that she has been finding bottles of wine/bourbon/whiskey/vodka from time to time in and around the house as recently as 01/2020 (the patient at times seems surprised are unaware of this, though also indicating that earlier in the spring when his wife was gone for family matters he would walk to the store by alcohol regularly.).  He states he had not undergone any formal AUD treatment -- either psychosocial or pharmacologic -- until 6/21 (began NTX).  He states he has never struggled with withdrawal although in late spring 2021 back he did note an increase in his essential tremor and some nausea that may have been associated with cessation of drinking while he was in the [facility]. See interval hx, above, for more recent drinking patterns      -- Sedative-Hypnotic: Denies      -- Stimulants: Denies      -- MDMA: denies      -- Inhalants: denies       -- Hallicinogens: denies      -- PCP: denies      -- Cannabis/cannabinoids: Denies      -- Tobacco: Denies    PSYCHIATRIC HISTORY:  -Diagnoses: neurocognitive disorder (likely vascular etiology)  -Hospitalizations: Denies  -ITAs: Denies  -Suicide Attempts: Denies  -Outpt Treatment Hx: Denies  -Previous Psychiatric Meds: Memantine  -Current Psychiatric Meds: Duloxetine 120mg /day (for pain), pregabalin 300mg  BID (for pain)    PAST MEDICAL HISTORY:  -Chronic shoulder/hip/back/neck pain, lumbar radiculopathy and lower extremity peripheral neuropathy, hyperlipidemia, HTN, 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, history of GLFs, essential tremor, and history of vitamin D and B12 deficiencies      SOCIAL HISTORY:  Living  situation: House independently but wife  Marital status: Married, adult children  Social supports: family  Education:  Employment history: 05-23-2005, now retired  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      amLODIPine 5 MG tablet TAKE ONE TABLET BY MOUTH TWICE DAILY 180 tablet 3    atorvastatin 10 MG tablet Take 1 tablet (10 mg) by mouth daily. Restart 09/19/2020 90 tablet 1    BENFOTIAMINE OR Take 250 mg by mouth daily.      Cholecalciferol (VITAMIN D3) 2000 units Oral Cap Take 1 capsule (2,000 Units) by mouth daily. For low level 1 capsule 1    Cyanocobalamin 1000 MCG Oral Tab one per day over-the-counter started 5/21 /2012 this level borderline low 1 Tab 1    diclofenac 1 % gel Apply 4 g topically 4 times a day. Apply to 2 gram four times daily to neck, trapezius muscle, and low back. Use a max of 32 grams a day. 300 g 4    diclofenac 1 % gel       DULoxetine 60 MG DR capsule TAKE 1 CAPSULE BY MOUTH TWICE DAILY 180 capsule 2    EPINEPHrine 0.3 MG/0.3ML auto-injector Inject as instructed per patient package insert, 0.3 mg intramuscularly or subcutaneously into the thigh, if needed to treat anaphylaxis 4 each 1    IBUPROFEN OR Take by mouth.      lidocaine 5 % patch Apply 1 patch onto the skin daily. Apply to painful area for up to 12 hours in a 24 hour period. 30 patch 5    lisinopril 20 MG tablet Take 1 tablet (20 mg) by mouth every 12 hours. 180 tablet 1    meloxicam 7.5 MG tablet Take 1 tablet (7.5 mg) by mouth daily as needed for pain for up to 14 days. For pain/inflammation. Take with food. 7 tablet 0    naltrexone 380 MG injection Inject 380 mg intramuscularly every 28 days.       ofloxacin 0.3 % otic solution Place 5 drops into right ear 2 times a day. 7 days (Patient not taking: Reported on 10/18/2020) 10 mL 0    pregabalin 300 MG capsule take 1 capsule by mouth twice a day 60 capsule 2    primidone 50 MG tablet Take 1 tablet (50 mg) by mouth 2 times a day. 180 tablet 0    thiamine 100 MG tablet Take 1 tablet by mouth daily.       triamcinolone 0.1 % cream As needed to rear bid x 1 week (Patient not taking: Reported on 10/18/2020) 15 g 1     Facility-Administered Medications Prior to Visit   Medication Dose Route Frequency Provider Last Rate Last Admin    naltrexone (Vivitrol) injection 380 mg  380 mg Intramuscular q21 days Rockne MenghiniIles-Shih, Naz[place] Kirk Don-Calvert, MD   380 mg at 11/13/20 1010       ALLERGIES:  Review of patient's allergies indicates:  Allergies   Allergen Reactions    Adhesives Skin: Rash    Bee Venom Skin: Hives, Skin: Itching and Swelling    Gabapentin      Flu like symptoms, diarrhea, body ache upset stomach    Methocarbamol Other     Patient's wife reports cognitive difficulties ("goofy")     MEDICAL REVIEW OF SYMPTOMS:  Other than in the HPI, all other systems are negative.  PHYSICAL EXAM:  There were no vitals taken for this visit.    -GENERAL: in no acute distress, non-diaphoretic   -HEENT: sclera/conjunctiva clear, PERRLA, no significant bruising or excoriations  -RESPIRATORY: non-labored breathing  -EXTREMITIES: No visible deformities, edema (albeit limited physical exam)  -SKIN : No skin abscesses, lesions, rashes, ulcers on exposed skin  -NEUROLOGICAL: Bilateral UE tremor (with reported chronic essential tremor), improved antalgic gait and with slowed and slightly wider set stance.      MENTAL STATUS EXAM:  General appearance: appropriately groomed and casually but neatly dressed  Behavior/activity: appropriate and reserved but cooperative  Speech: generally normal rate and prosody  Affect: euthymic and Improved range and reactivity (though still mildly  constricted)  Mood: euthymic  Thought Form: More linear, goal-focused today  Thought Content: No SI/HI and No AH or VH  Orientation: person, place and situation  Attention/concentration: attentive  Memory: History of shorter-term memory impairments (is consistent in his telling relative to EHR and prior encounters, though no direct collateral from spouse today)  Insight and judgment: some insight  Additional comments/details:     LAB RESULTS:  Results for orders placed or performed in visit on 09/19/20   COVID-19 Coronavirus Qualitative PCR   Result Value Ref Range    COVID-19 Coronavirus Qual PCR Specimen Type Nasal swab     COVID-19 Coronavirus Qual PCR Result None detected NDET    COVID-19 Coronavirus Qual PCR Interpretation       This is a negative result. Laboratory testing alone cannot rule out infection, particularly in the presence of clinical risk factors such as symptoms or exposure history.     Results for DEIGO, ALONSO (MRN [id number]) as of 11/21/2020 09:14   Ref. Range 06/06/2020 10:20   AST (GOT) Latest Ref Range: 9 - 38 U/L 21   ALT (GPT) Latest Ref Range: 10 - 48 U/L 21   Alkaline Phosphatase (Total) Latest Ref Range: 36 - 161 U/L 93   Bilirubin (Total) Latest Ref Range: 0.2 - 1.3 mg/dL 0.6   Bilirubin (Direct) Latest Ref Range: 0.0 - 0.3 mg/dL 0.1   Protein (Total) Latest Ref Range: 6.0 - 8.2 g/dL 6.8   Albumin Latest Ref Range: 3.5 - 5.2 g/dL 4.5     Results for DRAEDEN, KELLMAN (MRN [id number])   Ref. Range 06/11/2020 13:45   Expected Drugs, URN Unknown SEE NOTES   Amphet/Methamphetamine Qual,URN Latest Ref Range: NRN  Negative   Barbiturate (Qual), URN Latest Ref Range: NRN  Positive (A)  [rx'd primidone]   Benzodiazepines (Qual), URN Latest Ref Range: NRN  Negative   Cocaine (Qual), URN Latest Ref Range: NRN  Negative   Fentanyl Qual, URN Latest Ref Range: NRN  Negative   Methadone (Qual), URN Latest Ref Range: NRN  Negative   Opiates (Qual), URN Latest Ref Range: NRN  Negative    Oxycodone (Qual), URN Latest Ref Range: NRN  Negative   Buprenorphine Qual, URN Latest Ref Range: NRN  Negative   Cannabinoids (Qual), URN Latest Ref Range: NRN  Negative   Tramadol Qual, URN Latest Ref Range: NRN  Negative   Drug Screen Test Info, URN Unknown SEE NOTES   Drug Screen Interp Comment, URN Unknown (NOTE)     IMAGING/EKG/OTHER STUDIES REVIEWED:  EKG 12-LEAD  Order: [id number]  Status:  Final result Visible to patient:  Yes (MyChart) Next appt:  04/04/2020 at 10:00 AM in Pain Management (Renea Ee, MD)   Component   Ref Range &  Units 02/09/19 1556    Ventricular Rate   BPM 87     Atrial Rate   BPM 87     P-R Interval   ms 162     QRS Duration   ms 70     Q-T Interval   ms 368     QTC Calculation   ms 442     P Axis   degrees 15     R Axis   degrees 31     T Axis   degrees 32     Diagnosis  Normal sinus rhythm   Normal ECG   When compared with ECG of 12-Oct-2017 10:58,   No significant change was found   Confirmed by DODGE M.D., STEPHEN [phone]) on 02/10/2019 11:29:37 AM          Specimen Collected: 02/09/19 15:56 Last Resulted: 02/10/19 11:29               PRESCRIPTION MONITORING PROGRAM RESULTS:  Medication Dispense History (from 11/22/2019 to 11/21/2020)     Dispensed Written Strength Quantity Refills Days Supply Provider Pharmacy   PREGABALIN 300 MG CAPSULE 11/06/2020 09/09/2020  60 unspecified  30 [name], [facility] #[id number]   PREGABALIN 300 MG CAPSULE 10/08/2020 09/09/2020  60 unspecified  30 [name], [facility] #[id number]   PREGABALIN 300 MG CAPSULE 09/09/2020 09/09/2020  60 unspecified  30 [name], [facility] #[id number]   PREGABALIN 300 MG CAPSULE 08/08/2020 06/12/2020  60 unspecified  30 [name], [facility] #[id number]   PREGABALIN 300 MG CAPSULE 07/11/2020 06/12/2020  60 unspecified  30 [name], [facility] #[id number]   PREGABALIN 300 MG CAPSULE 06/12/2020 06/12/2020  60 unspecified  30 [name], [facility] #[id number]    PREGABALIN 100 MG CAPSULE 03/29/2020 12/14/2019  270 unspecified  90 KRAFT, MD[facility] #[id number]   PREGABALIN 50 MG CAPSULE 03/29/2020 12/14/2019  270 unspecified  90 KRAFT, [facility] #[id number]   PREGABALIN 50 MG CAPSULE 12/21/2019 12/14/2019  270 unspecified  90 KRAFT, [facility] #[id number]   PREGABALIN 100 MG CAPSULE 12/20/2019 12/14/2019  270 unspecified  90 KRAFT, [facility] #[id number]          SUICIDE SAFETY ASSESSMENT:  Suicide Ideation Risk Category: Low: No SI, or, only passive death wishes    Risk factors include: substance use disorder    Suicide Ideation protective factors: hope for future, responsibility to others, well engaged in treatment, high spirituality or belief that suicide is immoral, protective social network and willingness to follow crisis plan    OVERALL SUICIDE ASSESSMENT:     At present, pt is estimated to be at low acute risk for harm to self or others (though pts baseline risk, relative to the general population, is increased) based on the balance of the following risk/protective factors: age, gender, race/ethnicity, sexual orientation, degree of social isolation/support, current psychiatric symptom-burden, SU hx, hx of SI/self-harm, current access to means, estimated personality structure, relative severity of comorbid medical issues/pain, other acute on chronic psychosocial stressors, engagement in treatment. Pt should, however, be monitored for significant changes in contextual and intra-personal factors that could enhance risk (e.g., major losses, worsening psychiatric symptom burden, relapse/escalating SU.)    ASSESSMENT:     Pt is a married, house, retired 70 year old male with history of AUD, neurocognitive disorder with prior CVA, chronic pain and other significant medical history here for an Addiction Psychiatry appointment.  Based on available data, pt meets criteria for the  SUDs, general psychiatric, and other medical conditions listed below  (under diagnoses).  This includes meeting criteria for alcohol use disorder, severe. Time has been spent with patient (and wife) discussing diagnosis, etiology, potential  treatment options (psychosocial and pharmacologic), with opportunities to ask and have all questions answered. The treatment plan, below, was developed in a collaborative manner.      Ronnie Mason is aware of risks of continued etoh use as well as NIAAA recommended limits; at this time, however, he is interested in controlled drinking with assistance of naltrexone and gabapentinoid (pregabalin), which may be a synergistic combination for alcohol cessation/controlled use. In the interval period, he had experienced some success in avoiding liquor and in decreasing his average use quantity (wine exclusively) 1-3 glasses wine 4 nights/wk (now w/structured 3/7 days abstinence), moving towards his stated goal of no more than 1.5 glasses of wine on 4-5 nights/wk. Discussed behavioral/structual interventions as well as pharmacotherapy (instructed on proper administration of medications, including the potential side effects) and verbalized understanding of the information provided, successfully switched NTX from PO to IM formulation (beginning 04/11/20) in order support compliance, while continuing pregabalin at current dose. Talis now wishes to trial dc of XR-NTX and has been counseled re relative r:b of this as well as other pharmacologic and psychosocial treatment options. 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 Araceli Bouche) pregabalin 300 mg BID  - per pt prefernce, will receive last XR-NTX  IM Q4wk on 12/11/20       -will cont to monitor use at home and if noting increase in drinking quant/freq, will restart NTX /day PO (rx written today) and return to clinic for likely re-initiation of XR-NTX.  - Psychosocial: encourage cont participation in psychotherapy for SUDs w/Mike, consider further involvement in IOP, AA/NA; residential tx not indicated at this time.      -Continued tracking daily EtOH use with wife and engaging in other mutual accountability efforts; continue to ensure no hard liquor in home, other structural/environmental strategies to assist in achieving drinking goals.  - Pt to present to ED/hospital with emergence of any significant w/d sxs, confusion, seizure or hallucinations    # OTHER:  -Patient to continue care with other providers (including pain clinic, PCP)  -Continue to work on reduction/mitigation of fall risk  -Consider recheck of B12, vitamin D given history of deficiencies (w/repletion and macrocytosis -- though may be etoh-related -- as indicated)  -Continue exercise/activity in gym/pool (scheduling daily, in possible)    # SAFETY:  - no indication of acute medical concern or acute risk of harm to self or others  - patient instructed to  present to clinic/MH provider, the local Emergency Department, dial 911, and/or call the crisis line (1-866-4CRISIS, or [phone]) should mental health status worsen or require immediate attention.     # FOLLOW-UP:  -RTC from XR-NTX inj in May (per pt  preference, last injection); encouraged to follow up with this provider PRN (in addition to continuing ongoing work with Kathlene November) .  -Pt to f/u with his addiction clinic team, is encouraged to attend groups, per pt's established program.  -Pt to f/u with primary care, pain clinic, other specialty medical care, as needed.

## 2020-11-22 ENCOUNTER — Encounter (HOSPITAL_BASED_OUTPATIENT_CLINIC_OR_DEPARTMENT_OTHER): Payer: Self-pay | Admitting: Anesthesiology

## 2020-11-22 ENCOUNTER — Ambulatory Visit: Payer: Medicare PPO | Attending: Anesthesiology | Admitting: Anesthesiology

## 2020-11-22 DIAGNOSIS — M5410 Radiculopathy, site unspecified: Secondary | ICD-10-CM | POA: Insufficient documentation

## 2020-11-22 DIAGNOSIS — G8929 Other chronic pain: Secondary | ICD-10-CM | POA: Insufficient documentation

## 2020-11-22 DIAGNOSIS — M47816 Spondylosis without myelopathy or radiculopathy, lumbar region: Secondary | ICD-10-CM | POA: Insufficient documentation

## 2020-11-22 DIAGNOSIS — M5136 Other intervertebral disc degeneration, lumbar region: Secondary | ICD-10-CM | POA: Insufficient documentation

## 2020-11-22 DIAGNOSIS — M545 Low back pain, unspecified: Secondary | ICD-10-CM | POA: Insufficient documentation

## 2020-11-22 DIAGNOSIS — M7918 Myalgia, other site: Secondary | ICD-10-CM | POA: Insufficient documentation

## 2020-11-22 DIAGNOSIS — M792 Neuralgia and neuritis, unspecified: Secondary | ICD-10-CM | POA: Insufficient documentation

## 2020-11-22 NOTE — Patient Instructions (Signed)
We will schedule you for medial branch block of left L4-5, L5-S1

## 2020-11-22 NOTE — Progress Notes (Signed)
Review of Systems   Constitutional: Negative.    HENT: Negative.     Eyes: Negative.    Respiratory: Negative.     Cardiovascular: Negative.    Gastrointestinal: Negative.    Endocrine: Negative.    Genitourinary: Negative.    Musculoskeletal:  Positive for arthralgias, back pain and gait problem.   Skin: Negative.    Allergic/Immunologic: Negative.    Hematological: Negative.    Psychiatric/Behavioral: Negative.

## 2020-11-22 NOTE — Progress Notes (Signed)
CHIEF COMPLAINT:   Chief Complaint   Patient presents with    Low Back Pain       HISTORY OF PRESENT ILLNESS:  The purpose of today's visit is interval reevaluation of interval reevaluation of low back and right hip pain. The patient has a pain history significant for Right Hip OA, R greater trochanter bursitis, L4-5 L5-S1 Facet arthropathy, radiculopathy, and myofascial pain. The patient was last seen by Dr. Araceli Bouche on 09/13/20 at which time we recommended:   graded return to physical activity, trial of boswellia, discussing meloxicam with his PCP for chronic use, acetaminophen up to 2 grams per day, diclofenac gel, and trigger point injections with steroid (performed 10/18/20).    He continues to be actively engaged with Addiction psychiatry and addiction specialist Dr. [name]. Currenlty on vivitrol injcetion but considering stopping as alcohol use has reduced.    He continues to complain of low back pain and right leg pain. He did not note any improvement from trigger point injection. Pain is generally on the left low back. Pain is generally low in 2-4/10 range unless he twists or bends the wrong. Pregabalin is very helpful. He very much notices it if her misses a dose, but he is very sure it is helping the pain. He continues to use tylenol 1000mg  BID. Aleve prn during the day is helpful for all pains. He endorses relief from duloxetine 60mg  BID.    He is also getting some tingling in the left arm. He has had this issue before and attributes it to tightness of his left arm. It does not bother him as much as the leg so he is ok with not pursuing much.    He has joined a gym with with his wife. He is doing activity in the pool with walking in the pool with some proxmal lower body strengthening exercises. He is also focusing on upper body stretches.       REVIEW OF SYSTEMS:  The review of systems documented by our clinic staff was provided by the patient. I reviewed and confirmed these details with  and have no further detail to add.    PHYSICAL EXAMINATION:  BP (!) (P) 155/93    Pulse (P) 82    Temp (P) 36.3 C (Temporal)    Ht (P) 6' 3.5" (1.918 m)    Wt (!) (P) 108.9 kg (240 lb)    SpO2 (P) 96%    BMI (P) 29.60 kg/m    Constitutional:Constitutional: well-appearing, well-groomed and in no distress   Skin, exposed parts: color normal, vascularity normal, no evidence of bleeding or bruising  Eyes: Negative for miosis bilaterally. Pupils appropriate for room lighting. Extraocular movements are intact bilaterally  Neurologic: Strength 5/5 in b/l lower extremities in proximal and distal muscles groups. Reduced sensation in upper posterior leg thigh.  Musculoskeletal: Lumbar facet loading significantly positive on left, mild on the right. Some hypertonicty of lumbar paraspinous muscles b/l      IMPRESSION:   Ronnie Mason is a 70 year old male with:  1. Right hip pain, Xrays from 2018 reviewed showing severe OA.This has largely resolved currently, minimal to no pain in this area at present.   2. Axial low back painsecondary tofacet arthropathy at L4-L5,L5-S1 with prior positive response to RFA(last in 2017) as well as lumbar facet arthropathy. Continue to suspect a significant component of myofascial pain given exacerbation with movement, reproducible tenderness to palpation. Also concern for nociplastic pain/central sensitization overlying multiple  somatic pain issues given diffuse pain symptoms and likely hyperalgesia.   3. Lower back pain with radiation into bilateral lower extremities radicular in nature. Notes less radicular pain at this time, primarily axial in nature, however does have prior evidence of disc bulge on imaging.   4. Deconditioning- Recent right foot surgery. PT currently on hold.   5. Prior treatment failures including:Short duration of benefit forinterlaminar lumbarepidural steroids and TFESI, limited benefit to lumbar MBB recently, memantine, Naltrexone.   6. Multiple prior  injections by outside providers(exluding MBB and trigger point injections):   Dr. [name]  ? 03/23/2018 -LEFT L5 TFESI with 80-85%relief for about 3-4 weeksof just axial back pain.  ? 09/15/2017 -Bilateral L5TFESI with 50-75% reliefof mixture of low back and leg pain.  ? 05/19/2017 -RIGHT L5 TFESIwith 100% relief ofRadicularsymptoms for about 3 months  ? 08/12/2016 - L5-S1 ILESI  ? 07/01/2016 - Ultrasound guided right trochanteric bursa injection   ? 05/20/2016 - Bilateral shoulder injections  ? 05/13/2016 - Right L3 L4 L5/S1 RFA   ? 02/19/2016 - Left L3, L4, L5/S1 RFA   ? 12/27/2012 - L5-S1 IL ESI  ? 08/31/2012 - Left L5 TFESI  ? 03/30/2012 - Left L4/5, L5/S1 facet inj  ? 02/10/2012 - Left C6 C7 MB RFA  ? 07/03/2011 - Left L3, L4, L5/S1 RFA  ? 05/12/2011 - Left C4, C5, C6 RFA  7. 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  [name] had no further questions.    Medication suggestions:   Continue pregabalin 300mg  BID  Continue duloxetine 60mg  BID  Continue acetaminophen up to 2 gm per day  Continue diclofenac gel    Procedure suggestions: Will plan for left medial branch block of left L4-5, L5-S1    Referral to PT as patient now starting to improve and engage with PT. May help him find more exercises to do in the pool.    Follow-up: For procedure    I spent a total of 48 minutes for the patient's care on the date of the service.

## 2020-11-26 ENCOUNTER — Encounter (HOSPITAL_BASED_OUTPATIENT_CLINIC_OR_DEPARTMENT_OTHER): Payer: Self-pay

## 2020-11-28 ENCOUNTER — Ambulatory Visit (INDEPENDENT_AMBULATORY_CARE_PROVIDER_SITE_OTHER): Payer: Self-pay

## 2020-11-28 ENCOUNTER — Telehealth (INDEPENDENT_AMBULATORY_CARE_PROVIDER_SITE_OTHER): Payer: Self-pay | Admitting: Podiatrist

## 2020-11-28 NOTE — Telephone Encounter (Signed)
Pt would like to speak with an MA or [name] right away pls.

## 2020-11-28 NOTE — Telephone Encounter (Signed)
I spoke with patient, he states he "messed up" his foot that he had surgery on 11/25/20 while gardening. He states yesterday it was sore and today he can't walk on it, has been using crutches all day. He is not sure if broke something, it is really swollen.    I consulted with Diane, [name] not in the office until 12/03/20. She said to have patient go to urgent care for xray and contact our office for follow up with [name] tomorrow.    I consulted with Dr. Laural Benes and she agreed, patient to go to urgent care for xray and contact office tomorrow for appointment with [name].  She requested patient be asked if he has a fever, open area or any signs of infection.    I relayed Dr. Henriette Combs instruction to patient, he states he has an appointment with urgent care tomorrow 11/29/20 at 1:00 pm. He relates:  Fever: None  Open area: None  Signs of infection: None  He will contact the office tomorrow to schedule a follow up with [name].

## 2020-11-28 NOTE — Telephone Encounter (Signed)
General Message:    Detailed Message: Pt would like a call as soon as possible. He thinks he injured the foot that he had surgery on a few years ago. He indicates that he injured the arch of his foot and would like a call as soon as possible.   Return Call: General message okay

## 2020-11-28 NOTE — Telephone Encounter (Addendum)
Call Type:  Triage Call    Caller Name:  Ronnie Mason    Facility Name:  [facility] [address]    Presenting Problem:  Pain-Severe Leg - hurt his leg that just had operation on this week end extreme pain    Assessment:  01. Symptoms: What are your symptoms? Having pain with swelling of right foot in arch area from base ot toes to heel x 3 days. Pain started after shoveling in garden x 3 days ago. Used that part of foot to apply pressure to the shovel.  02. Onset: When did your symptoms/concern begin? (minutes, hours, days, or months ago?) Having pain with swelling of right foot in arch area from base ot toes to heel x 3 days. Pain started after shoveling in garden x 3 days ago.  03. Action: What actions or treatments have you taken to treat your symptoms/concern? using ice packs, taken tylenol 650 mg 4x/day, last taken 20 mins ibuprofen, ibuprofen 200 mg x 2 hrs ago  04. Response: What was the response to your actions taken? does not help  05. Severity: How severe are your symptoms? (ex. interference with normal activities, pain scale, size or appearance of injury) Just now pain is 9.5/10    Triage Note:  Caller unable to bear any weight on right foot. Is having to walk with crutches only.    Guideline Title:  Ankle and Foot Injury (Adult)    Guideline Question:  [1] Can't stand (bear weight) or walk? Yes    Recommended Disposition:  GO to ED Now    Original Inclination:  NA-Urgent Call    Intended Action:  GO to ED Now    Care Advice:  Care Advice given per Foot and Ankle Injury (Adult) guideline.  Go to ED Now:  * You need to be seen in the Emergency Department.  * Go to the ED at ___________ Hospital.  Note to Triager - Driving:  * Another adult should drive.  * An Urgent Care Center can usually manage this problem, IF one is available in the caller's area.

## 2020-11-29 ENCOUNTER — Ambulatory Visit (INDEPENDENT_AMBULATORY_CARE_PROVIDER_SITE_OTHER): Payer: Medicare PPO | Admitting: Family Medicine

## 2020-11-29 VITALS — BP 145/82 | HR 91 | Temp 99.2°F | Resp 14

## 2020-11-29 DIAGNOSIS — M79671 Pain in right foot: Secondary | ICD-10-CM

## 2020-11-29 MED ORDER — KETOROLAC TROMETHAMINE 30 MG/ML IJ SOLN
30.0000 mg | Freq: Once | INTRAMUSCULAR | Status: AC
Start: 2020-11-29 — End: 2020-11-29
  Administered 2020-11-29: 30 mg via INTRAMUSCULAR

## 2020-11-29 NOTE — Progress Notes (Signed)
Patient Referred By: No ref. provider found  Patient's PCP: [name], MD  [facility]    Urgent Care Family Medicine Note    Chief Complaint    Mr. Sandford Diop is a 70 year old year old male who presents to [facility] for   Chief Complaint   Patient presents with    Foot Injury     R-foot, while gardening, odd sensation while shoveling, bruising and pain x4 days, worse since        Subjective:  Patient is a 70 year old male, here to discuss Foot Injury (R-foot, while gardening, odd sensation while shoveling, bruising and pain x4 days, worse since)        Pt with neuropathy here with R foot pain and swelling after gardening/shoveling 4d ago.     Foot Injury         Review of Systems      Objective:  Physical Exam  Musculoskeletal:        Feet:    Feet:      Right foot:      Skin integrity: Erythema present. No skin breakdown or warmth.      Comments: Very tender         Assessment and Plan:   Diagnoses and all orders for this visit:    Right foot pain  -     XR Foot 3 View Right; Future  -     CBC with Diff; Future  -     ketorolac (Toradol) injection 30 mg  -     CBC with Diff    pt sent to get xray. Has fu with podiatry on Monday.

## 2020-11-29 NOTE — Telephone Encounter (Signed)
Talked with pt today, he still can't put weight on his foot, he will keep his urgent care appt today , see if they can get him comfortable and we will follow up with him on Monday.

## 2020-11-30 ENCOUNTER — Other Ambulatory Visit (INDEPENDENT_AMBULATORY_CARE_PROVIDER_SITE_OTHER): Payer: Self-pay | Admitting: Family Medicine

## 2020-11-30 ENCOUNTER — Telehealth (INDEPENDENT_AMBULATORY_CARE_PROVIDER_SITE_OTHER): Payer: Self-pay | Admitting: Family Medicine

## 2020-11-30 DIAGNOSIS — L03116 Cellulitis of left lower limb: Secondary | ICD-10-CM

## 2020-11-30 LAB — CBC, DIFF
% Basophils: 1 %
% Eosinophils: 3 %
% Immature Granulocytes: 0 %
% Lymphocytes: 10 %
% Monocytes: 9 %
% Neutrophils: 77 %
% Nucleated RBC: 0 %
Absolute Eosinophil Count: 0.4 10*3/uL (ref 0.00–0.50)
Absolute Lymphocyte Count: 1.2 10*3/uL (ref 1.00–4.80)
Basophils: 0.07 10*3/uL (ref 0.00–0.20)
Hematocrit: 46 % (ref 38.0–50.0)
Hemoglobin: 15.3 g/dL (ref 13.0–18.0)
Immature Granulocytes: 0.05 10*3/uL (ref 0.00–0.05)
MCH: 33.5 pg (ref 27.3–33.6)
MCHC: 33.5 g/dL (ref 32.2–36.5)
MCV: 100 fL — ABNORMAL HIGH (ref 81–98)
Monocytes: 1.12 10*3/uL — ABNORMAL HIGH (ref 0.00–0.80)
Neutrophils: 9.84 10*3/uL — ABNORMAL HIGH (ref 1.80–7.00)
Nucleated RBC: 0 10*3/uL
Platelet Count: 200 10*3/uL (ref 150–400)
RBC: 4.57 10*6/uL (ref 4.40–5.60)
RDW-CV: 13.6 % (ref 11.6–14.4)
WBC: 12.68 10*3/uL — ABNORMAL HIGH (ref 4.3–10.0)

## 2020-11-30 MED ORDER — CEPHALEXIN 500 MG OR CAPS
500.0000 mg | ORAL_CAPSULE | Freq: Two times a day (BID) | ORAL | 0 refills | Status: DC
Start: 2020-11-30 — End: 2020-12-10

## 2020-11-30 NOTE — Telephone Encounter (Signed)
-----   Message from Gillis Ends, MD sent at 11/30/2020 10:48 AM PDT -----  Call pt. With elevated wbc and foot redness will add abx to treatment.

## 2020-11-30 NOTE — Result Encounter Note (Signed)
Call pt. With elevated wbc and foot redness will add abx to treatment.

## 2020-11-30 NOTE — Telephone Encounter (Signed)
LVM regarding test results and tx for patient. Advise to call us back for any questions. Closing te.

## 2020-12-02 ENCOUNTER — Encounter (HOSPITAL_BASED_OUTPATIENT_CLINIC_OR_DEPARTMENT_OTHER): Payer: Self-pay

## 2020-12-02 ENCOUNTER — Inpatient Hospital Stay
Admission: EM | Admit: 2020-12-02 | Discharge: 2020-12-10 | DRG: 574 | Disposition: A | Discharge status: Home / Self Care | Payer: Medicare PPO | Source: Ambulatory Visit | Attending: Family Medicine | Admitting: Family Medicine

## 2020-12-02 ENCOUNTER — Ambulatory Visit (INDEPENDENT_AMBULATORY_CARE_PROVIDER_SITE_OTHER): Admit: 2020-12-02 | Discharge: 2020-12-02 | Disposition: A | Discharge status: Home / Self Care | Payer: Medicare PPO

## 2020-12-02 ENCOUNTER — Inpatient Hospital Stay (HOSPITAL_COMMUNITY): Payer: Self-pay | Admitting: Podiatrist

## 2020-12-02 ENCOUNTER — Other Ambulatory Visit: Payer: Self-pay

## 2020-12-02 ENCOUNTER — Ambulatory Visit (INDEPENDENT_AMBULATORY_CARE_PROVIDER_SITE_OTHER): Payer: Medicare PPO | Admitting: Podiatrist

## 2020-12-02 DIAGNOSIS — L02619 Cutaneous abscess of unspecified foot: Secondary | ICD-10-CM | POA: Insufficient documentation

## 2020-12-02 DIAGNOSIS — M1611 Unilateral primary osteoarthritis, right hip: Secondary | ICD-10-CM | POA: Diagnosis present

## 2020-12-02 DIAGNOSIS — M65171 Other infective (teno)synovitis, right ankle and foot: Secondary | ICD-10-CM | POA: Diagnosis present

## 2020-12-02 DIAGNOSIS — L03115 Cellulitis of right lower limb: Secondary | ICD-10-CM | POA: Diagnosis present

## 2020-12-02 DIAGNOSIS — M79671 Pain in right foot: Secondary | ICD-10-CM

## 2020-12-02 DIAGNOSIS — F1020 Alcohol dependence, uncomplicated: Secondary | ICD-10-CM | POA: Diagnosis present

## 2020-12-02 DIAGNOSIS — Z8249 Family history of ischemic heart disease and other diseases of the circulatory system: Secondary | ICD-10-CM

## 2020-12-02 DIAGNOSIS — L03119 Cellulitis of unspecified part of limb: Secondary | ICD-10-CM

## 2020-12-02 DIAGNOSIS — I10 Essential (primary) hypertension: Secondary | ICD-10-CM

## 2020-12-02 DIAGNOSIS — L02611 Cutaneous abscess of right foot: Principal | ICD-10-CM | POA: Diagnosis present

## 2020-12-02 DIAGNOSIS — E785 Hyperlipidemia, unspecified: Secondary | ICD-10-CM | POA: Diagnosis present

## 2020-12-02 DIAGNOSIS — G609 Hereditary and idiopathic neuropathy, unspecified: Secondary | ICD-10-CM | POA: Diagnosis present

## 2020-12-02 DIAGNOSIS — G894 Chronic pain syndrome: Secondary | ICD-10-CM

## 2020-12-02 DIAGNOSIS — Z87891 Personal history of nicotine dependence: Secondary | ICD-10-CM

## 2020-12-02 DIAGNOSIS — Z8711 Personal history of peptic ulcer disease: Secondary | ICD-10-CM

## 2020-12-02 DIAGNOSIS — M5416 Radiculopathy, lumbar region: Secondary | ICD-10-CM | POA: Diagnosis present

## 2020-12-02 DIAGNOSIS — I129 Hypertensive chronic kidney disease with stage 1 through stage 4 chronic kidney disease, or unspecified chronic kidney disease: Secondary | ICD-10-CM | POA: Diagnosis present

## 2020-12-02 DIAGNOSIS — L0291 Cutaneous abscess, unspecified: Secondary | ICD-10-CM | POA: Diagnosis present

## 2020-12-02 DIAGNOSIS — R4189 Other symptoms and signs involving cognitive functions and awareness: Secondary | ICD-10-CM | POA: Diagnosis present

## 2020-12-02 DIAGNOSIS — A4901 Methicillin susceptible Staphylococcus aureus infection, unspecified site: Secondary | ICD-10-CM | POA: Diagnosis present

## 2020-12-02 DIAGNOSIS — Z20822 Contact with and (suspected) exposure to covid-19: Secondary | ICD-10-CM | POA: Diagnosis present

## 2020-12-02 DIAGNOSIS — M1712 Unilateral primary osteoarthritis, left knee: Secondary | ICD-10-CM | POA: Diagnosis present

## 2020-12-02 DIAGNOSIS — M47812 Spondylosis without myelopathy or radiculopathy, cervical region: Secondary | ICD-10-CM | POA: Diagnosis present

## 2020-12-02 DIAGNOSIS — Z800 Family history of malignant neoplasm of digestive organs: Secondary | ICD-10-CM

## 2020-12-02 DIAGNOSIS — I96 Gangrene, not elsewhere classified: Secondary | ICD-10-CM | POA: Diagnosis present

## 2020-12-02 DIAGNOSIS — Z888 Allergy status to other drugs, medicaments and biological substances status: Secondary | ICD-10-CM

## 2020-12-02 DIAGNOSIS — N183 Chronic kidney disease, stage 3 unspecified: Secondary | ICD-10-CM | POA: Diagnosis present

## 2020-12-02 DIAGNOSIS — Z87820 Personal history of traumatic brain injury: Secondary | ICD-10-CM

## 2020-12-02 HISTORY — DX: Cellulitis of unspecified part of limb: L03.119

## 2020-12-02 HISTORY — DX: Cutaneous abscess of unspecified foot: L02.619

## 2020-12-02 LAB — CBC, DIFF
% Basophils: 1 %
% Eosinophils: 5 %
% Immature Granulocytes: 0 %
% Lymphocytes: 13 %
% Monocytes: 11 %
% Neutrophils: 70 %
% Nucleated RBC: 0 %
Absolute Eosinophil Count: 0.54 10*3/uL — ABNORMAL HIGH (ref 0.00–0.50)
Absolute Lymphocyte Count: 1.33 10*3/uL (ref 1.00–4.80)
Basophils: 0.09 10*3/uL (ref 0.00–0.20)
Hematocrit: 43 % (ref 38.0–50.0)
Hemoglobin: 14.2 g/dL (ref 13.0–18.0)
Immature Granulocytes: 0.04 10*3/uL (ref 0.00–0.05)
MCH: 33.6 pg (ref 27.3–33.6)
MCHC: 32.9 g/dL (ref 32.2–36.5)
MCV: 102 fL — ABNORMAL HIGH (ref 81–98)
Monocytes: 1.14 10*3/uL — ABNORMAL HIGH (ref 0.00–0.80)
Neutrophils: 6.97 10*3/uL (ref 1.80–7.00)
Nucleated RBC: 0 10*3/uL
Platelet Count: 209 10*3/uL (ref 150–400)
RBC: 4.23 10*6/uL — ABNORMAL LOW (ref 4.40–5.60)
RDW-CV: 13.3 % (ref 11.6–14.4)
WBC: 10.11 10*3/uL — ABNORMAL HIGH (ref 4.3–10.0)

## 2020-12-02 LAB — COMPREHENSIVE METABOLIC PANEL
ALT (GPT): 15 U/L (ref 10–48)
AST (GOT): 20 U/L (ref 9–38)
Albumin: 3.8 g/dL (ref 3.5–5.2)
Alkaline Phosphatase (Total): 71 U/L (ref 36–161)
Anion Gap: 8 (ref 4–12)
Bilirubin (Total): 0.4 mg/dL (ref 0.2–1.3)
Calcium: 9.1 mg/dL (ref 8.9–10.2)
Carbon Dioxide, Total: 30 meq/L (ref 22–32)
Chloride: 100 meq/L (ref 98–108)
Creatinine: 1.06 mg/dL (ref 0.51–1.18)
Glucose: 100 mg/dL (ref 62–125)
Potassium: 4 meq/L (ref 3.6–5.2)
Protein (Total): 6.9 g/dL (ref 6.0–8.2)
Sodium: 138 meq/L (ref 135–145)
Urea Nitrogen: 32 mg/dL — ABNORMAL HIGH (ref 8–21)
eGFR by CKD-EPI: >60 mL/min/{1.73_m2} (ref >59)

## 2020-12-02 LAB — SARS-COV-2 (COVID-19) QUALITATIVE RAPID PCR: COVID-19 Coronavirus Qual PCR Result: NOT DETECTED

## 2020-12-02 LAB — C_REACTIVE PROTEIN: C_Reactive Protein: 215.8 mg/L — ABNORMAL HIGH (ref 0.0–10.0)

## 2020-12-02 LAB — SED RATE: Erythrocyte Sedimentation Rate: 58 mm/h — ABNORMAL HIGH (ref 0–15)

## 2020-12-02 LAB — LAB ADD ON ORDER

## 2020-12-02 MED ORDER — PRIMIDONE 50 MG OR TABS
50.0000 mg | ORAL_TABLET | Freq: Two times a day (BID) | ORAL | Status: DC
Start: 2020-12-02 — End: 2020-12-11
  Administered 2020-12-03 – 2020-12-10 (×15): 50 mg via ORAL
  Filled 2020-12-02 (×17): qty 1

## 2020-12-02 MED ORDER — LIDOCAINE 4 % EX PTCH
1.0000 | MEDICATED_PATCH | Freq: Every day | CUTANEOUS | Status: DC
Start: 2020-12-03 — End: 2020-12-11
  Administered 2020-12-03 – 2020-12-08 (×3): 1 via TRANSDERMAL
  Filled 2020-12-02 (×6): qty 1

## 2020-12-02 MED ORDER — ACETAMINOPHEN 325 MG OR TABS
650.0000 mg | ORAL_TABLET | ORAL | Status: DC | PRN
Start: 2020-12-02 — End: 2020-12-11
  Administered 2020-12-04 – 2020-12-10 (×15): 650 mg via ORAL
  Filled 2020-12-02 (×15): qty 2

## 2020-12-02 MED ORDER — AMLODIPINE BESYLATE 5 MG OR TABS
5.0000 mg | ORAL_TABLET | Freq: Two times a day (BID) | ORAL | Status: DC
Start: 2020-12-02 — End: 2020-12-11
  Administered 2020-12-03 – 2020-12-10 (×15): 5 mg via ORAL
  Filled 2020-12-02 (×17): qty 1

## 2020-12-02 MED ORDER — DEXTROSE-SODIUM CHLORIDE 5-0.45 % IV SOLN
100.0000 mL/h | INTRAVENOUS | Status: DC
Start: 2020-12-02 — End: 2020-12-07
  Administered 2020-12-03: 100 mL/h via INTRAVENOUS

## 2020-12-02 MED ORDER — NALTREXONE HCL 50 MG OR TABS
50.0000 mg | ORAL_TABLET | Freq: Every day | ORAL | Status: DC
Start: 2020-12-03 — End: 2020-12-02

## 2020-12-02 MED ORDER — DICLOFENAC SODIUM 1 % EX GEL
4.0000 g | Freq: Four times a day (QID) | CUTANEOUS | Status: DC | PRN
Start: 2020-12-02 — End: 2020-12-11
  Filled 2020-12-02: qty 100

## 2020-12-02 MED ORDER — CEFTRIAXONE 2 G IN NS 50 ML IVPB MB-PLUS (SIMPLE)
2.0000 g | INJECTION | Freq: Once | Status: AC
Start: 2020-12-02 — End: 2020-12-02
  Administered 2020-12-02: 2 g via INTRAVENOUS
  Filled 2020-12-02: qty 50

## 2020-12-02 MED ORDER — NALTREXONE 380 MG IM SUSR
380.0000 mg | INTRAMUSCULAR | Status: DC
Start: 2020-12-12 — End: 2020-12-11

## 2020-12-02 MED ORDER — ONDANSETRON HCL 4 MG OR TABS
4.0000 mg | ORAL_TABLET | Freq: Three times a day (TID) | ORAL | Status: DC | PRN
Start: 2020-12-02 — End: 2020-12-11

## 2020-12-02 MED ORDER — ONDANSETRON HCL 4 MG/2ML IJ SOLN
4.0000 mg | Freq: Once | INTRAMUSCULAR | Status: AC
Start: 2020-12-02 — End: 2020-12-02
  Administered 2020-12-02: 4 mg via INTRAVENOUS
  Filled 2020-12-02: qty 2

## 2020-12-02 MED ORDER — MORPHINE SULFATE (PF) 4 MG/ML IV/IJ SOLN WRAPPER
4.0000 mg | Freq: Once | Status: AC
Start: 2020-12-02 — End: 2020-12-02
  Administered 2020-12-02: 4 mg via INTRAVENOUS
  Filled 2020-12-02: qty 1

## 2020-12-02 MED ORDER — VANCOMYCIN HCL 500 MG IV SOLR
2.5000 g | Freq: Once | INTRAVENOUS | Status: AC
Start: 2020-12-02 — End: 2020-12-03
  Administered 2020-12-02: 2.5 g via INTRAVENOUS
  Filled 2020-12-02: qty 50

## 2020-12-02 MED ORDER — LISINOPRIL 10 MG OR TABS
20.0000 mg | ORAL_TABLET | Freq: Two times a day (BID) | ORAL | Status: DC
Start: 2020-12-02 — End: 2020-12-11
  Administered 2020-12-03 – 2020-12-10 (×15): 20 mg via ORAL
  Filled 2020-12-02: qty 1
  Filled 2020-12-02 (×2): qty 2
  Filled 2020-12-02: qty 1
  Filled 2020-12-02 (×14): qty 2

## 2020-12-02 MED ORDER — ATORVASTATIN CALCIUM 10 MG OR TABS
10.0000 mg | ORAL_TABLET | Freq: Every day | ORAL | Status: DC
Start: 2020-12-03 — End: 2020-12-11
  Administered 2020-12-03 – 2020-12-10 (×8): 10 mg via ORAL
  Filled 2020-12-02 (×10): qty 1

## 2020-12-02 MED ORDER — DULOXETINE HCL 30 MG OR CPEP
60.0000 mg | DELAYED_RELEASE_CAPSULE | Freq: Two times a day (BID) | ORAL | Status: DC
Start: 2020-12-02 — End: 2020-12-11
  Administered 2020-12-03 – 2020-12-10 (×16): 60 mg via ORAL
  Filled 2020-12-02 (×9): qty 2
  Filled 2020-12-02: qty 1
  Filled 2020-12-02: qty 2
  Filled 2020-12-02: qty 1
  Filled 2020-12-02 (×3): qty 2
  Filled 2020-12-02: qty 1
  Filled 2020-12-02 (×2): qty 2

## 2020-12-02 MED ORDER — ONDANSETRON HCL 4 MG/2ML IJ SOLN
4.0000 mg | Freq: Three times a day (TID) | INTRAMUSCULAR | Status: DC | PRN
Start: 2020-12-02 — End: 2020-12-11

## 2020-12-02 MED ORDER — SENNOSIDES 8.6 MG OR TABS
17.2000 mg | ORAL_TABLET | Freq: Two times a day (BID) | ORAL | Status: DC | PRN
Start: 2020-12-02 — End: 2020-12-11
  Administered 2020-12-05: 17.2 mg via ORAL
  Filled 2020-12-02: qty 2

## 2020-12-02 MED ORDER — PREGABALIN 150 MG OR CAPS
300.0000 mg | ORAL_CAPSULE | Freq: Two times a day (BID) | ORAL | Status: DC
Start: 2020-12-02 — End: 2020-12-11
  Administered 2020-12-03 – 2020-12-10 (×16): 300 mg via ORAL
  Filled 2020-12-02 (×16): qty 2

## 2020-12-02 MED ORDER — LACTATED RINGERS BOLUS
1000.0000 mL | Freq: Once | INTRAVENOUS | Status: AC
Start: 2020-12-02 — End: 2020-12-03
  Administered 2020-12-02: 1000 mL via INTRAVENOUS

## 2020-12-02 MED ORDER — HEPARIN SODIUM (PORCINE) 5000 UNIT/ML IJ SOLN
5000.0000 [IU] | Freq: Three times a day (TID) | INTRAMUSCULAR | Status: DC
Start: 2020-12-02 — End: 2020-12-07
  Administered 2020-12-02 – 2020-12-07 (×12): 5000 [IU] via SUBCUTANEOUS
  Filled 2020-12-02 (×12): qty 1

## 2020-12-02 MED ORDER — OXYCODONE HCL 5 MG OR TABS
10.0000 mg | ORAL_TABLET | Freq: Once | ORAL | Status: AC
Start: 2020-12-02 — End: 2020-12-02
  Administered 2020-12-02: 10 mg via ORAL
  Filled 2020-12-02: qty 2

## 2020-12-02 NOTE — ED Triage Notes (Addendum)
Pt c/o right foot infection (injury at that time), getting worse over time, seen at [facility] clinic, put on PO Abx. Went to [facility] med appt today, advised to come to ER for foot evaluation.     From call-in note: POV 70 yo male R foot abscess Podiatry [name]. Admit to INPT/podiatry will consult

## 2020-12-02 NOTE — H&P (Signed)
History and Physical     Ronnie Mason ("Ronnie Mason") - DOB: 10-16-1950 70 year old male)  Gender Identity: Male  Preferred Pronouns: he/him/his  PCP: Donnal Moat, MD   Code Status: Full Code       CHIEF CONCERN / IDENTIFICATION:  Ronnie Mason is a 70 year old male with right foot abscess        SUBJECTIVE   HISTORY OF PRESENT ILLNESS:   70 y/o male with hx of HTN, HLD, AUD and chronic low back and right hip pain who presents with right foot abscess. Patient reports 5 days ago he was working on his garden and felt a weird sensation while shoveling.  He felt like he pulled something and had pain underneath his right foot.  He was wearing work boots with thick soles.  No known trauma.  The next day he noticed there was redness on the sole of his right foot and over the course of 1 day he had a painful bump that he was trying to express.  Over the next 24 to 48 hours he noticed an open wound with thick/cottage cheese-like drainage. It was difficult for him to bear weight due to aching and throbbing pain. He was seen in urgent care 4 days after onset, thought to be a gout flare. Was not given any medications.  He then followed up with podiatry, Dr. Maudie Mercury this morning, irrigated foot with saline (patient reports he removed pieces of wood from wound), obtained foot XR and plan for I&D tomorrow 5/3.  He denies fever, chills, abdominal pain, diarrhea, constipation, chest pain, shortness of breath.  Of note he has chronic low back and right hip pain for which he sees pain clinic as well as AUD on naltrexone and is actively engaged with addiction psychiatry.    ED course:   Patient was afebrile, heart rate 80-81, RR 16 and normotensive.  SPO2 95% on room air.  CMP notable for BUN 32, otherwise normal.  CBC with white count 10.1, H/H 14.2/43.  ESR 58.  CRP to 15.8.  Blood cultures were collected.  He was given ceftriaxone and vancomycin.  He received morphine 4 mg with improvement in pain.  He  received 1 L LR and Zofran for nausea. Admitted to [facility] for right foot abscess.      Review of Systems  I have performed a complete review of systems with the patient, which is negative except as indicated per HPI.      HISTORY REVIEWED:   I have reviewed the patient's medical history, social history and surgical history with the patient.    OUTPATIENT MEDICATIONS:   Current Outpatient Medications   Medication Instructions   . acetaminophen (TYLENOL) 1,000 mg, Oral, 2 times daily   . Alpha-Lipoic Acid 300 MG tablet 1 tablet, Oral, Daily   . amLODIPine 5 MG tablet TAKE ONE TABLET BY MOUTH TWICE DAILY   . atorvastatin (LIPITOR) 10 mg, Oral, Daily, Restart 09/19/2020   . BENFOTIAMINE OR 250 mg, Oral, Daily   . cephalexin (KEFLEX) 500 mg, Oral, 2 times daily, Take until gone.   . Cyanocobalamin 1000 MCG Oral Tab one per day over-the-counter started 5/21 /2012 this level borderline low   . diclofenac (VOLTAREN) 4 g, Topical, 4 times daily PRN   . diclofenac (VOLTAREN) 4 g, Topical, 4 times daily, Apply to 2 gram four times daily to neck, trapezius muscle, and low back. Use a max of 32 grams a day.   [name]  DULoxetine 60 MG DR capsule TAKE 1 CAPSULE BY MOUTH TWICE DAILY   . EPINEPHrine 0.3 MG/0.3ML auto-injector Inject as instructed per patient package insert, 0.3 mg intramuscularly or subcutaneously into the thigh, if needed to treat anaphylaxis   . IBUPROFEN OR 200 mg, Oral, Daily PRN   . lidocaine 5 % patch 1 patch, Transdermal, Daily, Apply to painful area for up to 12 hours in a 24 hour period.   [name] lisinopril (PRINIVIL; ZESTRIL) 20 mg, Oral, Every 12 hours   . meloxicam (MOBIC) 7.5 mg, Oral, Daily PRN, For pain/inflammation. Take with food.   . naltrexone (REVIA) 50 mg, Oral, Daily   . naltrexone (VIVITROL) 380 mg, Intramuscular, Every 28 days   . ofloxacin 0.3 % otic solution 5 drops, Right Ear, 2 times daily, 7 days   . pregabalin 300 MG capsule take 1 capsule by mouth twice a day   . primidone (MYSOLINE) 50 mg, Oral, 2  times daily   . triamcinolone 0.1 % cream As needed to rear bid x 1 week   . Vitamin D3 2,000 units, Oral, Daily, For low level       ALLERGIES:   Adhesives, Bee venom, Gabapentin, and Methocarbamol        OBJECTIVE     Vitals (Arrival)      T: (!) 35.9 C (12/02/20 1500)  BP: 114/75 (12/02/20 1501)  HR: 81 (12/02/20 1500)  RR: 16 (12/02/20 1500)  SpO2: 95 % (12/02/20 1500) Room air   Vitals (Most recent in last 24 hrs)   T: 36.8 C (12/02/20 2044)  BP: (!) 146/72 (12/02/20 2044)  HR: 80 (12/02/20 2044)  RR: 16 (12/02/20 2044)  SpO2: 94 % (12/02/20 2044) Room air  T range: Temp  Min: 35.9 C  Max: 36.8 C  Wt 240 lb (108.863 kg)     (no height taken for this visit)     Body mass index is 29.6 kg/m (pended).       Date 12/02/20 0700 - 12/03/20 0659   Shift 0700-1459 1500-2259 2300-0659 24 Hour Total   INTAKE   IV Piggyback  50  50   Shift Total  50  50   OUTPUT   Shift Total       NET  50  50   Weight (kg)  108.9 108.9 108.9       Physical Exam  Vitals reviewed.   Constitutional:       General: He is not in acute distress.     Appearance: Normal appearance.   Eyes:      Extraocular Movements: Extraocular movements intact.   Cardiovascular:      Rate and Rhythm: Normal rate.      Pulses: Normal pulses.      Heart sounds: No murmur heard.  Pulmonary:      Effort: Pulmonary effort is normal. No respiratory distress.   Abdominal:      Palpations: Abdomen is soft.   Musculoskeletal:      Comments: Mid right foot: Plantar surface with approximately 3x4cm linear, open wound with exposed tissue, no active drainage noted. Diffuse surrounding erythema extending to dorsal surface. Warm to touch, painful with deep palpation. Plantarflexion and dorsiflexion with no pain. Flexion and extension of toes without pain. 2+ DP pulses. Sensation intact. Well healed incision on dorsum of foot inferior to left hallux.    Neurological:      General: No focal deficit present.      Mental Status: He is alert.  Psychiatric:         Mood and  Affect: Mood normal.           Labs (last 24 hours):   Chemistries  CBC  LFT  Gases, other   138 100 32 100   14.2   AST: 20 ALT: 15  -/-/-/-  -/-/-/-   4.0 30 1.06   10.11 >< 209  AP: 71 T bili: 0.4  Lact (a): - Lact (v): -   eGFR: >60 Ca: 9.1   43   Prot: 6.9 Alb: 3.8  Trop I: - D-dimer: -   Mg: - PO4: -  ANC: 6.97     BNP: - Anti-Xa: -     ALC: 1.33    INR: -        Data Review:      Reviewed Results? Independently visualized & interpreted? Key Findings     Lab [x]  []     Radiology []  []     EKG/Tele/Echo  []  []     Other?  []  []        Imaging: 11/22/20 (at podiatry visit)   Right foot xray   Finding:   There is no evidence of osteolysis or emphysema noted.  There is no fracture.      ASSESSMENT/PLAN      This is a 70 year old male with a history of HTN, HLD, PUD on naltrexone and chronic pain who presents with approximately 5 days of right foot pain with progression of erythema now with an open wound, s/p IV antibiotics, currently afebrile and hemodynamically stable with plan for I&D by podiatry tomorrow, 5/3.       #Right foot abscess   #Right foot cellulitis   LRINEC score 4 (CRP >150 mg/L). Afebrile and HDS. Now s/p wound irrigation, CTX and vanc with plan for surgical debridement on 5/3 by podiatry. Per their note, recommending MRI of right foot.   -NPO at [facility] for I&D 5/3 by podiatry   -f/u MRI of right foot   -f/u blood cultures   -f/u wound cultures   -mIVF while NPO    -pain control with PO analgesic as below, IV morphine for breakthrough pain       #Chronic pain   Followed by pain clinic, last seen 11/22/20. No changes in medication regimen.   -Continue pregabalin 347m BID  -Continue duloxetine 666mBID  -Continue acetaminophen up to 2 gm per day  -Continue diclofenac gel    #AUD    11/13/20: 38059maltrexone XR administered to L dorsogluteal. Prefers dorsogluteal injections to right side, okay with ventrogluteal or dorsogluteal to left side. Reports last drank about 8 months ago.    -Next injection due  12/11/2020    #HTN   #HLD   Normotensive in ED. No cardiac symptoms reported. Stable on current medication regimen.   -continue amlodipine 5mg41mID    -continue lisinopril 20mg67mD   -continue atorvastatin 10mg,25mly        Inpatient Checklist:    Fluids/Electrolytes/Nutrition: mIVF. NPO at [facility]   Prophylaxis: SQH   Lines/Drains/Airways: PIV x 1   Disposition: acute care   Code Status: FULL   Contacts: Primary Emergency Contact: ThomasLy, [place] Phone: [phone] Vara KMarthe Patch     BeatriGraciella Freer Resident Physician, PGY-2   [facility]

## 2020-12-02 NOTE — Progress Notes (Addendum)
Patient: Ronnie Mason   Patient DOB: Aug 11, 1950     DOS:  12/02/2020     Accompanied by:  patient was unaccompanied    Chief Complaint: Ronnie Mason is a 70 year old year old male who is former patient presents today with new complaint(s):   Chief Complaint   Patient presents with   . Right Foot - Injury        History of present Illness:    (history is obtained by MA/RN and edited by [name])  The location/patient point to the area: plantar right forefoot 2-3 cm proximal to metatarsal head.    The duration of current condition is 1 week.    It began acutely.    The precipitating event was digging in garden with shovel, foot pressed hard into shovel, started having pain.  His wife confess that he try to lance the blister.    Current symptoms are sore and painful, hard to walk, skin broke open and discharge of cottage cheese like substance.    Patient denies: fever, chills.    The quality of pain is aching, sharp and throbbing.    The level the pain/severity of pain is moderate.    The condition is is not getting better.    The affected area is made worse by walking on it.    Past treatments/studies include soaking, elevating.    The patient states no improvement with treatment.    Previous diagnostic test/evaluation:  urgent care, did labs also.    Patient's activity limitation is limited in walking and limited in recreation.      Prior foot/ankle surgeries: yes - s/p 1st MTPJ arthrodesis and during early post-op period he had felt fracture proximal phalanx - undergone ORIF. He had difficult time following post-op instruction.  Later we removed the hardware due to hardware pain.    Goals: evaluate current condition    PCP:  Ronnie Medal, MD     PMH:   Past Medical History:   Diagnosis Date   . Pain of right hip joint 01/23/2016   . Intraparenchymal hemorrhage of brain (HCC) 2017   . 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 Allergic rhinitis due to allergen     . Carotid Sinus Hypersensitivity    . Disc disorder of lumbosacral region    . Dizziness    . Heart murmur    . Hip injury    . HYPERTENSION     . HYPOTENSION     . Nosebleed    . Strep throat    . Stroke (HCC)    . Syncope    . 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(HCC)    . URI (upper respiratory infection)         Review of patient's allergies indicates:  Allergies   Allergen Reactions   . Adhesives Skin: Rash   . Bee Venom Skin: Hives, Skin: Itching and Swelling   . Gabapentin      Flu like symptoms, diarrhea, body ache upset stomach   . Methocarbamol Other     Patient's wife reports cognitive difficulties ("goofy")        Medications:   Facility-Administered Medications Prior to Visit   Medication Dose Route Frequency Provider Last Rate Last Admin   . naltrexone (Vivitrol) injection 380 mg  380 mg Intramuscular q21 days Rockne Menghini, MD   380 mg at 11/13/20 1010     Outpatient Medications Prior to Visit  Medication Sig Dispense Refill   . Acetaminophen 500 MG Oral Tab Take 1,000 mg by mouth 2 times a day.     . Alpha-Lipoic Acid 300 MG tablet Take 1 tablet by mouth daily.     [name] amLODIPine 5 MG tablet TAKE ONE TABLET BY MOUTH TWICE DAILY 180 tablet 3   . atorvastatin 10 MG tablet Take 1 tablet (10 mg) by mouth daily. Restart 09/19/2020 90 tablet 1   . BENFOTIAMINE OR Take 250 mg by mouth daily.     . cephalexin 500 MG capsule Take 1 capsule (500 mg) by mouth 2 times a day for 7 days. Take until gone. (Patient not taking: Reported on 12/02/2020) 14 capsule 0   . Cholecalciferol (VITAMIN D3) 2000 units Oral Cap Take 1 capsule (2,000 Units) by mouth daily. For low level 1 capsule 1   . Cyanocobalamin 1000 MCG Oral Tab one per day over-the-counter started 5/21 /2012 this level borderline low 1 Tab 1   . diclofenac 1 % gel Apply 4 g topically 4 times a day. Apply to 2 gram four times daily to neck, trapezius muscle, and low back. Use a max of 32 grams a day. 300 g 4   . diclofenac 1 % gel Apply 4 g topically 4  times a day as needed.     . DULoxetine 60 MG DR capsule TAKE 1 CAPSULE BY MOUTH TWICE DAILY 180 capsule 2   . EPINEPHrine 0.3 MG/0.3ML auto-injector Inject as instructed per patient package insert, 0.3 mg intramuscularly or subcutaneously into the thigh, if needed to treat anaphylaxis 4 each 1   . IBUPROFEN OR Take 200 mg by mouth daily as needed.     . lidocaine 5 % patch Apply 1 patch onto the skin daily. Apply to painful area for up to 12 hours in a 24 hour period. (Patient taking differently: Apply 1 patch onto the skin daily as needed. Apply to painful area for up to 12 hours in a 24 hour period.) 30 patch 5   . lisinopril 20 MG tablet Take 1 tablet (20 mg) by mouth every 12 hours. 180 tablet 1   . meloxicam 7.5 MG tablet Take 1 tablet (7.5 mg) by mouth daily as needed for pain for up to 14 days. For pain/inflammation. Take with food. (Patient not taking: Reported on 12/02/2020) 7 tablet 0   . naltrexone 380 MG injection Inject 380 mg intramuscularly every 28 days.     . naltrexone 50 MG tablet Take 1 tablet (50 mg) by mouth daily. 30 tablet 1   . ofloxacin 0.3 % otic solution Place 5 drops into right ear 2 times a day. 7 days (Patient not taking: Reported on 12/02/2020) 10 mL 0   . pregabalin 300 MG capsule take 1 capsule by mouth twice a day 60 capsule 2   . primidone 50 MG tablet Take 1 tablet (50 mg) by mouth 2 times a day. 180 tablet 0   . thiamine 100 MG tablet Take 1 tablet by mouth daily.      [name] triamcinolone 0.1 % cream As needed to rear bid x 1 week (Patient not taking: Reported on 12/02/2020) 15 g 1        PSH:   Past Surgical History:   Procedure Laterality Date   . L meniciscus Left 07/2017    by Dr Marcelle Overlie , re did left menisicus    . PR ANES; COLONOSCOPY  2002    repeat in 3 years   .  PR ANES; COLONOSCOPY & POLYPECTOMY  11/18/2007    repeat in 3 years   . PR HALLUX RIGIDUS W/CHEILECTOMY 1ST MP JT W/O IMPLT Right 10/21/2017    Dr. Donn PieriniBrian McInnes   . PR UNLISTED PROCEDURE FEMUR/KNEE     . PR UNLISTED  PROCEDURE HANDS/FINGERS     . PR UNLISTED PROCEDURE SPINE  2013    rfa to c3 to c 7    . TOE SURGERY Right 02/09/2019   . toe surgery  Right 01/12/2019    Dr Hilarie Fredricksonony Damond Borchers         Family History:  family history includes Colon Cancer in his father; Heart (other) in his mother; Other Family Hx in an other family member. There is no history of Cancer.    Social History:  Social History     Socioeconomic History   . Marital status: Married     Spouse name: Not on file   . Number of children: Not on file   . Years of education: Not on file   . Highest education level: Not on file   Occupational History   . Not on file   Tobacco Use   . Smoking status: Never Smoker   . Smokeless tobacco: Former Estate agentUser   Substance and Sexual Activity   . Alcohol use: Yes     Alcohol/week: 8.0 - 12.0 standard drinks     Types: 8 - 12 Standard drinks or equivalent per week     Comment: drinks a night max   . Drug use: Yes     Types: Marijuana     Comment: CBD and THC   . Sexual activity: Not on file   Other Topics Concern   . 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  Non-smoker, 14 or more drinks/week almost exclusively wine with dinner.            Social Determinants of Health     Financial Resource Strain: Not on file   Food Insecurity: Not on file   Transportation Needs: Not on file   Physical Activity: Not on file   Stress: Not on file   Social Connections: Not on file   Intimate Partner Violence: Not on file   Housing Stability: Not on file        ROS is positive for musculoskeletal as mentioned in the HPI and as stated in the past medical history.  The patient's review of symptoms is otherwise unremarkable and documented in the electronic health record on 12/02/2020.    Physical Exam:   There were no vitals filed for this visit.     General: The patient is White, Well developed, appearing stated age and in no acute distress      Vascular:   RIGHT:  Dorsalis  pedis pulse is palpable.  Posterior tibial pulse is palpable. Capillary filling time note to be immediate on the distal hallux.      LEFT:  Dorsalis pedis pulse is palpable.  Posterior tibial pulse is palpable. Capillary filling time note to be immediate on the distal hallux.      Neurologic:  RIGHT:  The sensation is intact on the foot/ankle.  Motor coordination is intact with normal muscle tone.  There is no manifestations of pathological reflexes noted.     LEFT:  The sensation is intact on the foot/ankle.  Motor coordination is intact with normal muscle tone.  There  is no manifestations of pathological reflexes noted.      Dermatological:   RIGHT:   There is four open round wound 2-3 cm in diameter linearly line up from medial to lateral with clear drainage on plantar forefoot proximal to metatarsal head.  There is deep red patch on entire arch without lymphangitis proximally.  There is NO odor and tophi noted.  On exam of wound - 1 cm wood splinter was removed.     LEFT:   Upon inspection, the skin note to have normal texture/tone/turgor.  There is no ulceration/open wound or dermatosis present.  Integument coloration is within normal range and temperature is within normal range.  The hair growth is normal.      Musculoskeletal:   RIGHT:  There is moderate-severe pain on palpation of the entire plantar arch.  There is mild-moderate swelling along the tendon.  There is no major deformity.       LEFT:  There is no pain to palpation, the range of motion within normal limits and pain free, manual muscle testing 5/5 all extrinsic muscles to the foot.  There is no digital deformity noted. There is no forefoot deformity noted.  Patient has rectus foot.      DIAGNOSTIC IMAGING:    Right:  AP/LAT/MO/LO foot (CPT- 14782) views     Finding:   There is no evidence of osteolysis or emphysema noted.  There is no fracture.      We have reviewed x-ray finding and detail discussion took place with the patient.        Ref Range  & Units 3 d ago   (11/29/20) 9 mo ago   (02/27/20) 9 mo ago   (02/10/20) 1 yr ago   (07/13/19) 1 yr ago   (05/05/19) 1 yr ago   (02/21/19) 2 yr ago   (08/15/18)   WBC   4.3 - 10.0 10*3/uL 12.68High  7.12  5.77  8.01  6.62  8.43  7.73    RBC   4.40 - 5.60 10*6/uL 4.57  4.15Low  4.45  4.93  5.21  4.42  4.06Low    Hemoglobin   13.0 - 18.0 g/dL 95.6  21.3  08.6  57.8  16.7  14.9  14.0    Hematocrit   38.0 - 50.0 % 46  44 R  47 R  50 R  52High R  46 R  42 R    MCV   81 - 98 fL 100High  105High  107High  101High  99High  105High  103High    MCH   27.3 - 33.6 pg 33.5  35.2High  35.5High  33.3  32.1  33.7High  34.5High    MCHC   32.2 - 36.5 g/dL 46.9  62.9  52.8  41.3  32.2  32.1Low  33.3    Platelet Count   150 - 400 10*3/uL 200  248  239  233  248  264  219    RDW-CV   11.6 - 14.4 % 13.6  12.7  13.2  14.6High  13.4  13.1  13.3    % Neutrophils   % 77  65  60  67  60  60  68    % Lymphocytes   % % Monocytes   % % Eosinophils   %  3  3  3  2  3  8  2     % Basophils   % 1  1  1  1  1  1  1     % Immature Granulocytes   % 0  0  0  0  0  1  1    Neutrophils   1.80 - 7.00 10*3/uL 9.84High  4.64  3.45  5.43  3.96  5.02  5.30    Absolute Lymphocyte Count   1.00 - 4.80 10*3/uL 1.20  1.43  1.32  1.52  1.58  1.77  1.45    Monocytes   0.00 - 0.80 10*3/uL 1.12High  0.76  0.74  0.85High  0.80  0.79  0.73    Absolute Eosinophil Count   0.00 - 0.50 10*3/uL 0.40  0.22  0.16  0.12  0.19  0.70High  0.16    Basophils   0.00 - 0.20 10*3/uL 0.07  0.05  0.08  0.06  0.07  0.11  0.05    Immature Granulocytes   0.00 - 0.05 10*3/uL 0.05  0.02  0.02  0.03  0.02  0.04  0.04    Nucleated RBC   0.00 10*3/uL 0.00  0.00  0.00  0.00  0.00  0.00  0.00    % Nucleated RBC   % 0  0  0  0  0  0  0    Resulting Agency [facility] [place] Medicine [facility]  [facility] Lab Medicine [facility] Lab Medicine [place] [facility]              Specimen Collected: 11/29/20 16:23 Last Resulted: 11/30/20 00:47             Assessment:   (L02.619) Abscess of foot, right plantar forefoot  (primary encounter diagnosis)  (L03.119,  L02.619) Cellulitis and abscess of foot    Plan:  Detailed education and discussion took place with the patient regards to abscess/cellulitis , right.  At this time, we recommend hospitalization for IV antibiotic and surgical I & D to address the abscess/infection.  Aurea PA have called the ED to informed that he is in route for admission .  All patient's questions were answered.  I recommend MRI.  He just age and we'll add him on for surgical I & D today after my clinic hour.    Prep procedure was ordered.      We have reviewed today's x-ray finding and detail discussion took place with the patient.      Following dressing was applied:  betadine paint was applied along the incision with sterile 4 x 4, ace bandage and daily dressing change instruction was given.      Return appointment: follow up in 1-2 weeks for post op check       12/01/20 DPM, FACFAS  Reconstructive Ankle and Foot Surgeon  [facility]

## 2020-12-02 NOTE — Progress Notes (Signed)
Accompanied by:  spouse    Referring provider/PCP:  Seen before, New problem   The location/patient point to the area of pain/concern is Right foot pain.    The duration of current condition is 1 week.    It began acutely.    The precipitating event was digging in garden with shovel,foot pressed hard into shovel, started having pain, if injury - mechanism -  .    Current symptoms are sore and painful, hard to walk, skin broke open and discharge of cottage cheese like substance.    Patient denies: fever, chills, .    The quality of pain is aching, sharp and throbbing.    The level the pain/severity of pain is moderate.    The condition is is not getting better.    The affected area is made worse by walking on it.    Past treatments/studies include soaking, elevating.    The patient states no improvement with treatment.    Previous diagnostic test/evaluation:  urgent care, did labs also.    Patient's activity limitation is limited in walking and limited in recreation.      Prior foot/ankle surgeries: yes

## 2020-12-03 ENCOUNTER — Encounter (HOSPITAL_COMMUNITY): Payer: Self-pay | Admitting: Anesthesiology

## 2020-12-03 ENCOUNTER — Ambulatory Visit (HOSPITAL_COMMUNITY): Payer: Medicare PPO | Admitting: Anesthesiology

## 2020-12-03 ENCOUNTER — Other Ambulatory Visit: Payer: Self-pay

## 2020-12-03 ENCOUNTER — Encounter (HOSPITAL_COMMUNITY)
Admission: EM | Disposition: A | Discharge status: Home / Self Care | Payer: Self-pay | Source: Ambulatory Visit | Attending: Family Medicine

## 2020-12-03 ENCOUNTER — Inpatient Hospital Stay (HOSPITAL_COMMUNITY): Admit: 2020-12-03 | Payer: Self-pay | Admitting: Podiatrist

## 2020-12-03 DIAGNOSIS — L03115 Cellulitis of right lower limb: Secondary | ICD-10-CM

## 2020-12-03 DIAGNOSIS — L02611 Cutaneous abscess of right foot: Secondary | ICD-10-CM

## 2020-12-03 HISTORY — PX: SURGICAL HX OTHER: 99

## 2020-12-03 LAB — BASIC METABOLIC PANEL
Anion Gap: 8 (ref 4–12)
Calcium: 8.9 mg/dL (ref 8.9–10.2)
Carbon Dioxide, Total: 30 meq/L (ref 22–32)
Chloride: 99 meq/L (ref 98–108)
Creatinine: 0.9 mg/dL (ref 0.51–1.18)
Glucose: 93 mg/dL (ref 62–125)
Potassium: 4.1 meq/L (ref 3.6–5.2)
Sodium: 137 meq/L (ref 135–145)
Urea Nitrogen: 20 mg/dL (ref 8–21)
eGFR by CKD-EPI: >60 mL/min/{1.73_m2} (ref >59)

## 2020-12-03 LAB — CBC (HEMOGRAM)
Hematocrit: 43 % (ref 38.0–50.0)
Hemoglobin: 14.1 g/dL (ref 13.0–18.0)
MCH: 33.5 pg (ref 27.3–33.6)
MCHC: 32.6 g/dL (ref 32.2–36.5)
MCV: 103 fL — ABNORMAL HIGH (ref 81–98)
Platelet Count: 203 10*3/uL (ref 150–400)
RBC: 4.21 10*6/uL — ABNORMAL LOW (ref 4.40–5.60)
RDW-CV: 13.2 % (ref 11.6–14.4)
WBC: 8.54 10*3/uL (ref 4.3–10.0)

## 2020-12-03 LAB — OR BACTERIAL C/S AEROBIC/ANAEROBIC WITH GRAM STAIN

## 2020-12-03 LAB — MAGNESIUM: Magnesium: 1.6 mg/dL — ABNORMAL LOW (ref 1.8–2.4)

## 2020-12-03 LAB — PHOSPHATE: Phosphate: 2.6 mg/dL (ref 2.5–4.5)

## 2020-12-03 LAB — FLUID C/S W/GRAM

## 2020-12-03 SURGERY — INCISION AND DRAINAGE, WOUND, EXTREMITY
Anesthesia: General | Site: Foot | Laterality: Right | Wound class: Class IV/ Dirty or Infected

## 2020-12-03 MED ORDER — NALOXONE HCL 0.4 MG/ML IJ SOLN
0.0400 mg | INTRAMUSCULAR | Status: DC | PRN
Start: 2020-12-03 — End: 2020-12-03

## 2020-12-03 MED ORDER — DEXAMETHASONE SODIUM PHOSPHATE 4 MG/ML IJ SOLN
INTRAMUSCULAR | Status: DC | PRN
Start: 2020-12-03 — End: 2020-12-03
  Administered 2020-12-03: 8 mg via INTRAVENOUS

## 2020-12-03 MED ORDER — MORPHINE SULFATE (PF) 10 MG/ML IV/IJ SOLN WRAPPER
2.0000 mg | Freq: Four times a day (QID) | Status: DC | PRN
Start: 2020-12-03 — End: 2020-12-04

## 2020-12-03 MED ORDER — ACETAMINOPHEN 10 MG/ML IV SOLN
1000.0000 mg | Freq: Once | INTRAVENOUS | Status: DC
Start: 2020-12-03 — End: 2020-12-03
  Filled 2020-12-03: qty 100

## 2020-12-03 MED ORDER — LACTATED RINGERS IV SOLN
10.0000 mL/h | INTRAVENOUS | Status: DC
Start: 2020-12-03 — End: 2020-12-07
  Administered 2020-12-03: 10 mL/h via INTRAVENOUS

## 2020-12-03 MED ORDER — LIDOCAINE HCL 1 % IJ SOLN
INTRAMUSCULAR | Status: DC | PRN
Start: 2020-12-03 — End: 2020-12-03
  Administered 2020-12-03: 15 mL via INTRAMUSCULAR

## 2020-12-03 MED ORDER — FENTANYL CITRATE (PF) 50 MCG/ML IJ SOLN WRAPPER (ANESTHESIA OSM ONLY)
INTRAMUSCULAR | Status: DC | PRN
Start: 2020-12-03 — End: 2020-12-03
  Administered 2020-12-03: 100 ug via INTRAVENOUS
  Administered 2020-12-03: 150 ug via INTRAVENOUS

## 2020-12-03 MED ORDER — HYDROMORPHONE HCL 1 MG/ML IJ SOLN
0.2000 mg | INTRAMUSCULAR | Status: DC | PRN
Start: 2020-12-03 — End: 2020-12-03

## 2020-12-03 MED ORDER — BUPIVACAINE HCL (PF) 0.25 % IJ SOLN
INTRAMUSCULAR | Status: DC | PRN
Start: 2020-12-03 — End: 2020-12-03
  Administered 2020-12-03: 5 mL via INTRAMUSCULAR

## 2020-12-03 MED ORDER — VANCOMYCIN 2 G IN NS 500 ML IVPB (PKG PREMIX)
2.0000 g | INJECTION | Freq: Once | INTRAVENOUS | Status: DC
Start: 2020-12-03 — End: 2020-12-03

## 2020-12-03 MED ORDER — LACTATED RINGERS IV SOLN
50.0000 mL/h | INTRAVENOUS | Status: DC
Start: 2020-12-03 — End: 2020-12-07
  Administered 2020-12-03: 50 mL/h via INTRAVENOUS

## 2020-12-03 MED ORDER — LORAZEPAM 2 MG/ML IJ SOLN
0.5000 mg | Freq: Once | INTRAMUSCULAR | Status: DC | PRN
Start: 2020-12-03 — End: 2020-12-03

## 2020-12-03 MED ORDER — METOCLOPRAMIDE HCL 5 MG/ML IJ SOLN
5.0000 mg | Freq: Once | INTRAMUSCULAR | Status: DC | PRN
Start: 2020-12-03 — End: 2020-12-03

## 2020-12-03 MED ORDER — HYDRALAZINE HCL 20 MG/ML IJ SOLN
5.0000 mg | INTRAMUSCULAR | Status: DC | PRN
Start: 2020-12-03 — End: 2020-12-03

## 2020-12-03 MED ORDER — OXYCODONE HCL 5 MG OR TABS
5.0000 mg | ORAL_TABLET | ORAL | Status: DC | PRN
Start: 2020-12-03 — End: 2020-12-03

## 2020-12-03 MED ORDER — MEPERIDINE HCL 25 MG/ML IJ SOLN
10.0000 mg | INTRAMUSCULAR | Status: DC | PRN
Start: 2020-12-03 — End: 2020-12-03

## 2020-12-03 MED ORDER — ONDANSETRON HCL 4 MG/2ML IJ SOLN
INTRAMUSCULAR | Status: DC | PRN
Start: 2020-12-03 — End: 2020-12-03
  Administered 2020-12-03: 4 mg via INTRAVENOUS

## 2020-12-03 MED ORDER — LABETALOL HCL 5 MG/ML IV SOLN
2.5000 mg | INTRAVENOUS | Status: DC | PRN
Start: 2020-12-03 — End: 2020-12-03

## 2020-12-03 MED ORDER — PROPOFOL 10 MG/ML IV EMUL WRAPPER (OSM ONLY)
INTRAVENOUS | Status: DC | PRN
Start: 2020-12-03 — End: 2020-12-03
  Administered 2020-12-03: 200 mg via INTRAVENOUS

## 2020-12-03 MED ORDER — CEFTRIAXONE 2 G IN NS 50 ML IVPB MB-PLUS (SIMPLE)
2.0000 g | INJECTION | Freq: Every day | Status: DC
Start: 2020-12-03 — End: 2020-12-04
  Administered 2020-12-03 – 2020-12-04 (×2): 2 g via INTRAVENOUS
  Filled 2020-12-03 (×2): qty 2

## 2020-12-03 MED ORDER — MORPHINE SULFATE (PF) 4 MG/ML IV/IJ SOLN WRAPPER
4.0000 mg | Freq: Once | Status: AC
Start: 2020-12-03 — End: 2020-12-03
  Administered 2020-12-03: 4 mg via INTRAVENOUS
  Filled 2020-12-03: qty 1

## 2020-12-03 MED ORDER — FENTANYL CITRATE (PF) 100 MCG/2ML IJ SOLN
12.5000 ug | INTRAMUSCULAR | Status: DC | PRN
Start: 2020-12-03 — End: 2020-12-03

## 2020-12-03 MED ORDER — CEFTRIAXONE 2 G IN NS 50 ML IVPB MB-PLUS (SIMPLE)
2.0000 g | INJECTION | Freq: Once | Status: DC
Start: 2020-12-03 — End: 2020-12-03

## 2020-12-03 MED ORDER — ONDANSETRON HCL 4 MG/2ML IJ SOLN
4.0000 mg | INTRAMUSCULAR | Status: DC | PRN
Start: 2020-12-03 — End: 2020-12-03

## 2020-12-03 MED ORDER — VANCOMYCIN 1.5 G IN NS 250 ML IVPB (PKG PREMIX)
1.5000 g | INJECTION | Freq: Two times a day (BID) | INTRAVENOUS | Status: DC
Start: 2020-12-03 — End: 2020-12-07
  Administered 2020-12-03 – 2020-12-06 (×8): 1.5 g via INTRAVENOUS
  Filled 2020-12-03: qty 1.5
  Filled 2020-12-03: qty 250
  Filled 2020-12-03 (×7): qty 1.5

## 2020-12-03 SURGICAL SUPPLY — 19 items
APPLICATOR PREP CHLORAPREP 26ML CLR 2% CHG (Prep) ×2 IMPLANT
BANDAGE GAUZE BULKEE II 4.1YDX4.5IN (Dressing) ×1 IMPLANT
BANDAGE MATRIX 5YDX4IN MED ELASTIC STERILE (Dressing) ×1 IMPLANT
CONTAINER SPEC 4OZ STERILE PNEUMATIC TUBE SAFE (Other) ×1 IMPLANT
COVER FLEX STERILE LF LIGHT HANDLE PLSTC DISP GRN 1EA/PK (Other) ×2 IMPLANT
DRAPE BILAT EXTREMITY PROXIMA 121INX74INX90IN STERILE (Drape) ×2 IMPLANT
GLOVE SURG 6.5 BIOGEL PI ULTRATOUCH G ×2 IMPLANT
GLOVE SURG 7 BIOGEL PI MICRO PF (Glove) ×2 IMPLANT
LINEN GOWN XL (Gown) ×2 IMPLANT
LINEN PACK SURGICAL (Other) ×1 IMPLANT
NEEDLE HYPODERMIC 18GA 1 1/2IN BD (Needle) ×2 IMPLANT
NEEDLE HYPODERMIC 25GA 5/8IN BD (Needle) ×2 IMPLANT
PACK PODIATRY (Pack) ×1 IMPLANT
PAD ABDOM 8X7.5IN CELLULOSE ABSORB NONWOVEN (Dressing) ×1 IMPLANT
PAD ESURG GRNDING UNIV PREATTACH CORD SPLIT (Other) ×1 IMPLANT
PREP SOLUTION PVP IODINE 3/4OZ POUCH (Prep) ×1 IMPLANT
SET INTERPULSE SUCTION TUBE HIGH FLOW TIP (Other) ×1 IMPLANT
SPONGE GAUZE 4INX4IN NS 12PLY (Sponge) ×1 IMPLANT
SYRINGE BD 10ML CONTROL (Syringe) ×2 IMPLANT

## 2020-12-03 NOTE — Nursing Note (Signed)
Patient left OR for I & D ,CHZ bath given. Hand off report given to Benns Church. Pt personal belonging sent with pt.

## 2020-12-03 NOTE — Progress Notes (Signed)
Physical Therapy    Patient Name: Ronnie Mason  MRN: [id number]  Today's Date: 12/03/2020    Orders received, chart reviewed. Pt to have procedure today, will need WB & activity restrictions post op. MD alerted.     Ulla Potash, PT  12/03/2020

## 2020-12-03 NOTE — Op Note (Signed)
Podiatry Operative Note   Ronnie Mason - DOB: 1950/12/24 70 year old male) MRN: [id number]  Procedure Date: 12/03/2020 Location:[place] [facility] MAIN OR       Preoperative Diagnosis:   Abscess of foot [L02.619]  Cellulitis and abscess of foot [L03.119, L02.619]    Post Operative Diagnosis:       * Abscess of foot [L02.619]     * Cellulitis and abscess of foot [L03.119, L02.619]    Procedures Performed:    RIGHT WOUND INCISION AND DRAINAGE using 15 blade/rondure where plantar fascia as well as soft tissue along the flexor distal longus tendon was excised/debrided- Right -       Surgeons:     Alphia Moh, DPM - Primary   I, Ancil Linsey, DPM, attending surgeon, was present for the entire procedure(s) and performed the entire procedure(s).  Resident was unavailable to assist.      Anesthesia:  General   EBL: 50 mL   Specimens:   ID Type Source Tests Collected by Time   A : soft tissue right foot Tissue Soft tissue, OTHER OR FUNGAL C/S Hilarie Fredrickson Dong-Hyun, DPM 12/03/2020 1713      Wound Class: Procedure(s):  RIGHT WOUND INCISION AND DRAINAGE - Wound Class: Class IV/ Dirty or Infected     Indication(s): Ronnie Mason is an 70 year old male with abscess/soft tissue necrosis on right plantar arch.  Patient states injury after shoveling at his vacation home at [facility].  His wife confess that he try to puncture the blister.  He is very elusive and does not give information freely.  Wood splinter was removed in the office.      Finding(s): A pus pocket/abscess was noted on central plantar arch through the plantar fascia communicating to the flexor digitorum longus tendon.  There is significant skin necrotic tissue in a transverse fashion on the central arch.     Procedure Details:   The patient was brought to the operating room and positioned supine setting.  All phases of SCOAP 1, 2, 3, 4 were followed for the surgery.  Perioperative antibiotic:   the patient received Vancomycin today.   Following general  anesthesia, local block was given.  Tourniquet was not used.          A transverse elliptical incision was made through the open wound excising the necrotic skin tissue as well as subcutaneous tissue.    Careful blunt and sharp tension was made.  All superficial bleeders cauterized.  The plantar fascia was exposed and there is a pus pocket noted through the plantar fascia in the mid central area.  At this point using 15 blade the portion of the plantar fascial excised and the flexor longus tendon was identified.  The tendon appears to be healthy but there is infectious tenosynovitis noted which was debrided with rongeur.  The wound is not allergic to any bony tissue.  Due to significant necrosis at this point likely use the pulse lavage.  3 L of normal saline was used for thorough irrigation of infected necrotic tissue.  Following thorough debridement no instrumentation used and any residual necrotic tissue that include the plantar fascia subcutaneous tissue and skin was excised total.  Due to infection, we will keep the wound open for local wound care and plan for delay wound closure.      The dressing entails 4 x 4 soaked on dilute betadine, dry 4 X4, web roll, Kerlix and Ace.  Patient tolerated the procedure and anesthesia well.  Patient left the OR to recovery with vital signs stable and normal.  The vascular is intact.      Plan: Patient will be inpatient to receive IV antibiotic and local wound care.  I will order wound care services and recommend applying wound wound VAC and consider delay wound closure when wound is ready (2-4 days).    Pathology: Soft tissue was sent for culture and sensitivity.    Complications: None.     Condition: stable

## 2020-12-03 NOTE — Anesthesia Preprocedure Evaluation (Signed)
Patient: Ronnie Mason    Procedure Information     Anesthesia Start Date/Time: 12/03/20 1629    Procedure: RIGHT WOUND INCISION AND DRAINAGE (Right Foot)    Location: [facility] MAIN OR 01 / [place] MAIN OR    Surgeons: Ronnie Mason, DPM        HPI:     Relevant Problems   Neuro/Psych   (+) History of alcohol dependence (Ronnie Mason)   (+) Hx of spontan intraparenchymal intracran bleed assoc with hypertension   (+) Syncope      Cardio   (+) Essential hypertension   (+) Hyperlipidemia      GI/Hepatic/Renal   (+) Chronic renal insufficiency, stage III (moderate) (Ronnie Mason)     Relevant surgical history:       Medications:     Outpatient:   Current Outpatient Medications   Medication Instructions   . acetaminophen (TYLENOL) 1,000 mg, Oral, 2 times daily   . Alpha-Lipoic Acid 300 MG tablet 1 tablet, Oral, Daily   . amLODIPine 5 MG tablet TAKE ONE TABLET BY MOUTH TWICE DAILY   . atorvastatin (LIPITOR) 10 mg, Oral, Daily, Restart 09/19/2020   . BENFOTIAMINE OR 250 mg, Oral, Daily   . cephalexin (KEFLEX) 500 mg, Oral, 2 times daily, Take until gone.   . Cyanocobalamin 1000 MCG Oral Tab one per day over-the-counter started 5/21 /2012 this level borderline low   . diclofenac (VOLTAREN) 4 g, Topical, 4 times daily, Apply to 2 gram four times daily to neck, trapezius muscle, and low back. Use a max of 32 grams a day.   . DULoxetine 60 MG DR capsule TAKE 1 CAPSULE BY MOUTH TWICE DAILY   . EPINEPHrine 0.3 MG/0.3ML auto-injector Inject as instructed per patient package insert, 0.3 mg intramuscularly or subcutaneously into the thigh, if needed to treat anaphylaxis   . ibuprofen (MOTRIN) 200 mg, Oral, Daily PRN   . lidocaine 5 % patch 1 patch, Transdermal, Daily, Apply to painful area for up to 12 hours in a 24 hour period.   [name] lisinopril (PRINIVIL; ZESTRIL) 20 mg, Oral, Every 12 hours   . naltrexone (REVIA) 50 mg, Oral, Daily   . pregabalin 300 MG capsule take 1 capsule by mouth twice a day   . primidone (MYSOLINE) 50 mg, Oral,  2 times daily   . Vitamin D3 2,000 units, Oral, Daily, For low level        Inpatient:   Scheduled   amLODIPine, 5 mg, BID  .  atorvastatin, 10 mg, Daily  .  cefTRIAXone, 2 g, Once  .  DULoxetine, 60 mg, BID  .  heparin, 5,000 units, q8h SCH  .  lidocaine, 1 patch, Daily  .  lisinopril, 20 mg, q12h  .  [Held By Provider] naltrexone, 380 mg, q21 days  .  pregabalin, 300 mg, BID  .  primidone, 50 mg, BID  .  vancomycin, 1.5 g, q12h      Continuous  dextrose 5% and sodium chloride 0.45%, 100 mL/hr, Continuous, Last Rate: 100 mL/hr (12/03/20 4097)  .  lactated ringers, 10-25 mL/hr, Continuous, Last Rate: 10 mL/hr (12/03/20 1613)      PRN  .  acetaminophen, 650 mg, q4h PRN  .  diclofenac, 4 g, QID PRN  .  ondansetron, 4 mg, q8h PRN  .  ondansetron, 4 mg, q8h PRN  .  senna, 17.2 mg, BID PRN          Review of patient's allergies indicates:  Allergies   Allergen Reactions   . Adhesives Skin: Rash   . Bee Venom Skin: Hives, Skin: Itching and Swelling   . Gabapentin      Flu like symptoms, diarrhea, body ache upset stomach   . Methocarbamol Other     Patient's wife reports cognitive difficulties ("goofy")       Social History:     Medical History and Review of Systems      Source of information: In person visit and Chart review.  History of anesthetic complications  (-) History of anesthetic complications.      Cardiovascular PMH Hypotension but negative cardiac workup    GI/Hepatic/Renal No GERD/N/V symptoms today              Physical Exam  Airway  Mallampati:  II  Upper Lip Bite Test: II  TM distance:  <6 cm  Neck ROM:  Limited(Extension)    Dental    Cardiovascular  normal      Pulmonary  normal             Labs: (last year)    BMP  CBC/Coags   Na 137 12/03/2020  Hb 14.1 12/03/2020   K 4.1 12/03/2020  HCT 43 12/03/2020   Cl 99 12/03/2020  WBC 8.54 12/03/2020   HCO3 30 12/03/2020  PLT 203 12/03/2020   BUN 20 12/03/2020  INR - -   Cr 0.90 12/03/2020  PT - -   Glu 93 12/03/2020  PTT - -       Misc   eGFR >60 12/03/2020  MCV 103 (H) 12/03/2020   A1C  - -  BNP - -       LFTs   AST 20 12/02/2020  Albumin 3.8 12/02/2020   ALT 15 12/02/2020  Protein 6.9 12/02/2020   Alk Phos 71 12/02/2020  T Bili 0.4 12/02/2020         Relevant procedures / diagnostic studies:     PAT CLINIC DISCUSSION    ANESTHESIA PLAN   Informed Consent:     Anesthesia Plan discussed with:        Patient    ASA Score:     ASA: 2  Planned Anesthetic Type:      general      Risk Calculators / Scores:     PONV: Intermediate Risk  Total Score: 2            Non-smoker     Intended opioid administration        Criteria that do not apply:    Male patient    History of PONV    History of motion sickness

## 2020-12-03 NOTE — ED Notes (Signed)
Resps easy and reg on RA, repositioned pt sitting up high fowlers. Call bell in reach, no voiced concerns. NPO for OR     Lonny Prude, RN  12/03/20 1432

## 2020-12-03 NOTE — Progress Notes (Addendum)
Vancomycin - Pharmacy Dosing  12/03/2020    Pharmacy consulted to manage vancomycin dosing for Ronnie Mason.  Vancomycin indication: Skin/Soft Tissue Infection, goal trough 10-20 mcg/ml. Current regimen:  2.5 g IV loading dose started on 5/2 in addition to ceftriaxone.    Relevant Clinical Data:  70 year old male, Ht: 75.5"  Wt:  (!) 108.9 kg (240 lb)     Creatinine (mg/dL)   Date Value   77/41/2878 1.06     Estimated CrCl: 90 mL/min w/AdjBW and CG    TMAX  36.8            WBC (10*3/uL)   Date Value   12/02/2020 10.11 (H)     Micro: blood culture: 5/2 pend, 5/2 wound culture: positive for 2+ GPC   COVID-19 Coronavirus Qual PCR: Negative  Imaging: XR foot       Assessment/Plan:  1. Prior vanc dosing regimens: none @this  hospital or elsewhere.  2. Risk factors for supratherapeutic vanco levels or kidney injury:obesity.  3. Load: 2.5g infused 5/2 2042. Continue w/vancomycin dose: 1.5g IV q12h starting 5/3 1030.  4. Level: 5/4 1000 at steady state.               5. Pharmacy will continue to follow clinical progress daily, monitoring for nephrotoxicity, and adjusting regimen as necessary.      2043, PharmD

## 2020-12-03 NOTE — ED Provider Notes (Signed)
CHIEF COMPLAINT   No chief complaint on file.           HISTORY OF PRESENT ILLNESS   Patient reports 5 days ago he began having right foot pain after resolution of all gardening.  Denies any obvious injury to the foot.  Saw primary care physician started on antibiotics worsening erythema swelling began having drainage saw podiatrist wound opened up sent to the ED for admission IV antibiotics and operative debridement.  Denies any fever chills denies any nausea vomiting.                PAST MEDICAL AND SURGICAL HISTORY   Past Medical History:   Diagnosis Date   . Pain of right hip joint 01/23/2016   . 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 Allergic rhinitis due to allergen    . Carotid Sinus Hypersensitivity    . Disc disorder of lumbosacral region    . Dizziness    . Heart murmur    . Hip injury    . HYPERTENSION     . HYPOTENSION     . Nosebleed    . Strep throat    . Stroke ([place])    . Syncope    . 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([place])    . URI (upper respiratory infection)          Past Surgical History:   Procedure Laterality Date   . L meniciscus Left 07/2017    by Dr Matthew Saras , re did left menisicus    . PR ANES; COLONOSCOPY  2002    repeat in 3 years   . PR ANES; COLONOSCOPY & POLYPECTOMY  11/18/2007    repeat in 3 years   . PR HALLUX RIGIDUS W/CHEILECTOMY 1ST MP JT W/O IMPLT Right 10/21/2017    Dr. Megan Salon   . PR UNLISTED PROCEDURE FEMUR/KNEE     . PR UNLISTED PROCEDURE HANDS/FINGERS     . PR UNLISTED PROCEDURE SPINE  2013    rfa to c3 to c 7    . TOE SURGERY Right 02/09/2019   . toe surgery  Right 01/12/2019    Dr Wilmon Arms          SOCIAL HISTORY   Social History     Tobacco Use   . Smoking status: Never Smoker   . Smokeless tobacco: Former [employer] Use Topics   . Alcohol use: Yes     Alcohol/week: 8.0 - 12.0 standard drinks     Types: 8 - 12 Standard drinks or equivalent per week     Comment: drinks a night  max   . 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  Non-smoker, 14 or more drinks/week almost exclusively wine with dinner.                  PAST FAMILY HISTORY   Family History     Problem (# of Occurrences) Relation (Name,Age of Onset)    Colon Cancer (1) Father    Heart (other) (1) Mother: MI    Other Family Hx (1) Other: myelodysplasia       Negative family history of: Cancer  ALLERGIES   Review of patient's allergies indicates:  Allergies   Allergen Reactions   . Adhesives Skin: Rash   . Bee Venom Skin: Hives, Skin: Itching and Swelling   . Gabapentin      Flu like symptoms, diarrhea, body ache upset stomach   . Methocarbamol Other     Patient's wife reports cognitive difficulties ("goofy")          REVIEW OF SYSTEMS   Review of Systems   Constitutional: Negative for fever and chills.   HENT: Negative for rhinorrhea and sore throat.    Respiratory: Negative for shortness of breath and cough.    Cardiovascular: Negative for chest pain and palpitations.   Gastrointestinal: Negative for abdominal pain and vomiting.   Genitourinary: Negative for dysuria and hematuria.   Musculoskeletal: Negative for back pain and arthralgias.   Skin: Positive for wound and color change.   Neurological: Negative for seizures and syncope.   Psychiatric/Behavioral: Negative for agitation and behavioral problems.   All other systems reviewed and are negative.             PHYSICAL EXAM   ED VITALS:     Vitals (Arrival)      T: (!) 35.9 C (12/02/20 1500)  BP: 114/75 (12/02/20 1501)  HR: 81 (12/02/20 1500)  RR: 16 (12/02/20 1500)  SpO2: 95 % (12/02/20 1500) Room air   Vitals (Most recent in last 24 hrs)   T: 36.8 C (12/02/20 2044)  BP: (!) 146/72 (12/02/20 2044)  HR: 80 (12/02/20 2044)  RR: 16 (12/02/20 2044)  SpO2: 94 % (12/02/20 2044) Room air  T range: Temp  Min: 35.9 C   Max: 36.8 C  Wt 240 lb (108.863 kg)     (no height taken for this visit)     Body mass index is 29.6 kg/m (pended).       Physical Exam  Vitals and nursing note reviewed.   Constitutional:       General: He is not in acute distress.     Appearance: He is well-developed. He is not ill-appearing or toxic-appearing.   HENT:      Head: Normocephalic and atraumatic.   Eyes:      Conjunctiva/sclera: Conjunctivae normal.   Neck:      Musculoskeletal: No neck rigidity.   Cardiovascular:      Rate and Rhythm: Normal rate and regular rhythm.      Heart sounds: No murmur heard.  Pulmonary:      Effort: Pulmonary effort is normal. No respiratory distress.      Breath sounds: Normal breath sounds.   Abdominal:      Palpations: Abdomen is soft.      Tenderness: There is no abdominal tenderness.   Musculoskeletal:         General: Swelling present.      Cervical back: Neck supple. No rigidity.      Comments: Edema erythema right forefoot plantar wound with purulent drainage no pain out of proportion to exam   Skin:     Capillary Refill: Capillary refill takes less than 2 seconds.   Neurological:      Mental Status: He is alert and oriented to person, place, and time.      Motor: No weakness.          EKG INTERPRETATION:            LABORATORY:   Labs Reviewed   CBC, DIFF - Abnormal  Result Value    WBC 10.11 (*)     RBC 4.23 (*)     Hemoglobin 14.2      Hematocrit 43      MCV 102 (*)     MCH 33.6      MCHC 32.9      Platelet Count 209      RDW-CV 13.3      % Neutrophils 70      % Lymphocytes 13      % Monocytes 11      % Eosinophils 5      % Basophils 1      % Immature Granulocytes 0      Neutrophils 6.97      Absolute Lymphocyte Count 1.33      Monocytes 1.14 (*)     Absolute Eosinophil Count 0.54 (*)     Basophils 0.09      Immature Granulocytes 0.04      Nucleated RBC 0.00      % Nucleated RBC 0     COMPREHENSIVE METABOLIC PANEL - Abnormal    Sodium 138      Potassium 4.0      Chloride 100      Carbon Dioxide, Total 30       Anion Gap 8      Glucose 100      Urea Nitrogen 32 (*)     Creatinine 1.06      Protein (Total) 6.9      Albumin 3.8      Bilirubin (Total) 0.4      Calcium 9.1      AST (GOT) 20      Alkaline Phosphatase (Total) 71      ALT (GPT) 15      eGFR by CKD-EPI >60     SED RATE - Abnormal    Erythrocyte Sedimentation Rate 58 (*)    C_REACTIVE PROTEIN - Abnormal    C_Reactive Protein 215.8 (*)    COVID-19 CORONAVIRUS QUALITATIVE PCR    COVID-19 Coronavirus Qual PCR Specimen Type Nasal swab      COVID-19 Coronavirus Qual PCR Result        COVID-19 Coronavirus Qual PCR Interpretation       SARS-COV-2 (COVID-19) QUALITATIVE RAPID PCR    COVID-19 Coronavirus Qual PCR Specimen Type Nasal swab      COVID-19 Coronavirus Qual PCR Result None detected      COVID-19 Coronavirus Qual PCR Interpretation        Value: This is a negative result. Laboratory testing alone cannot rule out infection, particularly in the presence of clinical risk factors such as symptoms or exposure history.    COVID-19 Qualitative PCR Indication Symptomatic     LAB ADD ON ORDER    Lab Test Requested ESR CRP      Specimen Type/Description Blood      Sample To Use Most recent      Test Request Status Order Processed     BASIC METABOLIC PANEL   MAGNESIUM   PHOSPHATE   CBC (HEMOGRAM)   BLOOD C/S    Special Requests Aerobic and anaerobic bottles      Culture No growth to date     BLOOD C/S    Special Requests Aerobic and anaerobic bottles      Culture No growth to date     FLUID C/S W/GRAM   WOUND C/S W/GRAM (ANAEROBIC)        IMAGING:   No orders to  display       No image results found.            MEDICATION ADMINISTERED      Medications   cefTRIAXone (Rocephin) 2 g in NS 50 mL IVPB ([facility]-Plus) (0 g Intravenous Stopped 12/02/20 1931)   vancomycin 2.5 g in sodium chloride 0.9 % 500 mL IVPB (2.5 g Intravenous New Bag 12/02/20 2042)   lactated ringers IV Bolus 1,000 mL (1,000 mL Intravenous New Bag 12/02/20 1901)   ondansetron (Zofran) injection 4 mg (4 mg Intravenous  Given 12/02/20 1913)   morphine PF injection 4 mg (4 mg Intravenous Given 12/02/20 1914)   oxyCODONE tablet 10 mg (10 mg Oral Given 12/02/20 2301)                 TRAUMA/STROKE/STEMI ACTIVATION                 SUICIDE RISK EVALUATION                 SEPSIS               ED COURSE/MEDICAL DECISION MAKING          Patient presents emergency department at the protest of his podiatrist.  He has large abscess to his right foot with surrounding cellulitis he has been on oral antibiotics reportedly for 2 days.  Discussed with his podiatrist Dr. Maudie Mercury who recommends admission IV antibiotics and take patient to surgery tomorrow.  Discussed with family medicine resident who graciously agrees with admission.  Patient also agrees with this plan.             DISPOSITION & IMPRESSION   Impression:    Clinical Impressions:   [L03.119, X83.358] Cellulitis and abscess of foot                    CRITICAL CARE - ATTENDING ONLY   Critical Care - No Critical Care                Genelle Bal, MD  12/04/20 [phone]

## 2020-12-03 NOTE — Anesthesia Postprocedure Evaluation (Signed)
Patient: Ronnie Mason    Procedure Summary     Date: 12/03/20 Room / Location: [facility]IN OR 01 / [facility] OR    Anesthesia Start: 1629 Anesthesia Stop: 1731    Procedure: RIGHT WOUND INCISION AND DRAINAGE (Right Foot) Diagnosis:       Abscess of foot      Cellulitis and abscess of foot      (Abscess of foot [L02.619])      (Cellulitis and abscess of foot [L03.119, L02.619])    Surgeons: Alphia Moh, DPM Responsible Provider: Beacher May, MD    Anesthesia Type: general ASA Status: 2        Final Anesthesia Type: general    Vitals Value Taken Time   BP 154/92 12/03/20 1800   Temp 36.5 C 12/03/20 1800   Pulse 66 12/03/20 1800   SpO2 93 % 12/03/20 1800       Place of evaluation: PACU    Patient participation: patient participated    Level of consciousness: fully conscious    Patient pain control satisfaction: patient is satisfied with level of pain control    Airway patency: patent    Cardiovascular status during assessment: stable    Respiratory status during assessment: breathing comfortably    Anesthetic complications: no    Intravascular volume status assessment: euvolemic    Nausea / vomiting: patient is not experiencing nausea      Planned post-operative disposition at time of assessment: hospital discharge

## 2020-12-03 NOTE — Anesthesia Procedure Notes (Signed)
Airway Placement    Staff:  Performing Provider: Beacher May, MD  Authorizing Provider: Beacher May, MD    Airway management:   Patient location: OR/Procedural area  Final airway type: Supraglottic airway  Intubation reason: General anesthesia    Induction:  Positioning: supine  Patient was pre-oxygenated: yes  Mask Ventilation: Grade 0 - Ventilation by mask not attempted     Intubation:    Final Attempt   Airway Type: SGA  LMA Type: Classic type  LMA Size: LMA Size:5    Number of Attempts: 1    Assessment:  Confirmation: auscultation and waveform capnography  Procedure Abandoned: no    Date / Time Airway Secured / Re-Secured:  12/03/2020 4:45 PM

## 2020-12-03 NOTE — Nursing Note (Signed)
ADT Time: 31  Transported From: PACU  To:  419  Transportation Method:  Proofreader  Accompanied By:  Hospital staff  Condition on Arrival:  Stable   SBAR Report Provided to:  This Nurse from [employer]  Location of Personal Belongings:  With patient  List Completed:    Items in [facility] or Pharmacy Returned to Patient:    Comments: Patient is A/OX3 , Declined pain ,Dressing to right foot is C/D/I . CMS is intact , pedal pulses positive , able to wiggle toes and warm to touch . Room orientation  Given . WCTM

## 2020-12-03 NOTE — Progress Notes (Signed)
Progress Note     Ronnie Mason") - DOB: Mar 11, 1951 70 year old male)  Gender Identity: Male  Preferred Pronouns: he/him/his  Admit Date: 12/02/2020  Code Status: Full Code       CHIEF CONCERN / IDENTIFICATION:  Ronnie Mason is a 70 year old male with a history of hypertension, hyperlipidemia, AUD on naltrexone, and chronic pain who presents R foot abscess, s/p IV antibiotics, currently afebrile and hemodynamically stable with plan for I&D by podiatry today, 5/3.      SUBJECTIVE   INTERVAL HISTORY:  -pain 7-8/10, improved with 1 dose of 4 mg morphine 1917 to 4-5/10, open to re-dose  -otherwise well, feels sleepy  -wonders when OR time will be    SCHEDULED MEDICATIONS:   amLODIPine, 5 mg, BID  .  atorvastatin, 10 mg, Daily  .  cefTRIAXone, 2 g, Once  .  DULoxetine, 60 mg, BID  .  heparin, 5,000 units, q8h SCH  .  lidocaine, 1 patch, Daily  .  lisinopril, 20 mg, q12h  .  [Held By Provider] naltrexone, 380 mg, q21 days  .  naltrexone, 380 mg, q21 days  .  pregabalin, 300 mg, BID  .  primidone, 50 mg, BID  .  vancomycin, 1.5 g, q12h    INFUSED MEDICATIONS:  dextrose 5% and sodium chloride 0.45%, 100 mL/hr, Continuous, Last Rate: 100 mL/hr (12/03/20 0648)     PRN MEDICATIONS:  .  acetaminophen, 650 mg, q4h PRN  .  diclofenac, 4 g, QID PRN  .  ondansetron, 4 mg, q8h PRN  .  ondansetron, 4 mg, q8h PRN  .  senna, 17.2 mg, BID PRN       OBJECTIVE     Vitals (Most recent in last 24 hrs)     T: 37.1 C (12/03/20 1446)  BP: (!) 152/86 (12/03/20 1446)  HR: 70 (12/03/20 1446)  RR: 18 (12/03/20 1446)  SpO2: 95 % (12/03/20 1446) Room air  T range: Temp  Min: 36.8 C  Max: 37.1 C  Admit weight: (!) 108.9 kg (240 lb) (12/02/20 1500)  Last weight: (!) 108.9 kg (240 lb) (12/02/20 1500)       I&Os:     Intake/Output Summary (Last 24 hours) at 12/03/2020 1544  Last data filed at 12/03/2020 1517  Intake 50 ml   Output 800 ml   Net -750 ml       Physical Exam  Constitutional:       General: He is not in acute  distress.     Appearance: Normal appearance.   Eyes:      Extraocular Movements: Extraocular movements intact.   Cardiovascular:      Rate and Rhythm: Normal rate.      Pulses: Normal pulses.      Heart sounds: No murmur heard.  Pulmonary:      Effort: Pulmonary effort is normal. No respiratory distress.   Abdominal:      Palpations: Abdomen is soft.   Musculoskeletal:      Comments: Mid right foot: Plantar surface with approximately 3 x 4 cm linear, open wound with exposed tissue, no active drainage noted. Diffuse surrounding erythema extending to dorsal surface. Warm to touch, painful with deep palpation. Plantarflexion and dorsiflexion with no pain. Flexion and extension of toes without pain. 2+ DP pulses. Sensation intact. Well healed incision on dorsum of foot inferior to left hallux.    Neurological:      General: No focal deficit  present.      Mental Status: He is alert.   Psychiatric:         Mood and Affect: Mood normal.         Labs (last 24 hours):   Chemistries  CBC  LFT  Gases, other   137 99 20 93   14.1   AST: 20 ALT: 15  -/-/-/-  -/-/-/-   4.1 30 0.90   8.54 >< 203  AP: 71 T bili: 0.4  Lact (a): - Lact (v): -   eGFR: >60 Ca: 8.9   43   Prot: 6.9 Alb: 3.8  Trop I: - D-dimer: -   Mg: 1.6 PO4: 2.6  ANC: 6.97     BNP: - Anti-Xa: -     ALC: 1.33    INR: -        MICRO:   I have personally reviewed the latest Micro results and cultures  Blood culture, peripheral, right hand:   - Aerobic and anaerobic bottles: No growth to date   Blood culture, peripheral, left arm:   - Aerobic and anaerobic bottles: No growth to date   Wound culture  - Gram smear: 2+ Polymorphonuclear cells, No squamous epithelial cells seen, 2+ Gram Positive Cocci  - Culture: 3+ Microbial growth observed     IMAGING:   I have reviewed the latest radiology results  Imaging Results:  XR Foot 3 View Right  Narrative: EXAMINATION:  XR FOOT 3 VW RIGHT    CLINICAL INDICATION:  right foot pain     COMPARISON:   Right foot radiograph  08/08/2019.    FINDINGS AND   Impression: As before, patient is status post arthrodesis of the first MTP joint. There is interval osseous modeling about the site of prior surgical hardware.    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 Remaining joint spaces are well-preserved.    Scattered vascular calcifications.    I have personally reviewed the images and agree with the report (or as edited).       Reviewed Results? Independently visualized & interpreted? Key Findings     Lab [x]  []     Radiology [x]  []     EKG/Tele/Echo  []  []     Other?  []  []             ASSESSMENT/PLAN      Ronnie Mason is a 70 year old male with a history of hypertension, hyperlipidemia, AUD on naltrexone, and chronic pain who presents R foot abscess, s/p IV antibiotics, currently afebrile and hemodynamically stable with plan for I&D by podiatry today, 5/3.     #Right foot abscess   #Right foot cellulitis   LRINEC score 4 (CRP >150 mg/L). Afebrile and HDS. Now s/p wound irrigation, CTX and vancomycin with plan for surgical debridement on 5/3 by podiatry. Per their note, recommending MRI of right foot.   -NPO at [facility] for I&D in the OR 5/3 by podiatry   -MRI of right foot declined by patient due to concern of cost  -f/u blood cultures   -f/u wound cultures    -mIVF while NPO    -pain control with PO analgesic as below, IV morphine for breakthrough pain     #Chronic pain   Followed by pain clinic, last seen 11/22/20. No changes in medication regimen.   -Continue pregabalin 300 mg BID  -Continue duloxetine 60 mg BID  -Continue acetaminophen up to 2 gm per day  -Continue diclofenac  gel    #AUD    11/13/20: 380 mg Naltrexone XR administered to L dorso-gluteal. Prefers dorsogluteal injections to right side, okay with ventro-gluteal or dorsogluteal to left side. Reports last drank about 8 months ago.    -Next injection due5/06/2021    #Hypertension  #Hyperlipidemia   Normotensive in ED. No cardiac  symptoms reported. Stable on current medication regimen.   -continue amlodipine 5 mg, BID    -continue lisinopril 20 mg, BID   -continue atorvastatin 10 mg, daily      Inpatient Checklist:  Fluids/Electrolytes/Nutrition:mIVF, NPO at [facility]   Prophylaxis:SQH   Lines/Drains/Airways:Left arm PIV x 1   Disposition:acute care   Code Status: FULL   Contacts: Primary Emergency Contact: [place], [place]", Home Phone: [phone]  PCP: Marthe Patch, MD

## 2020-12-04 ENCOUNTER — Encounter (HOSPITAL_COMMUNITY): Payer: Self-pay

## 2020-12-04 DIAGNOSIS — S91301A Unspecified open wound, right foot, initial encounter: Secondary | ICD-10-CM

## 2020-12-04 LAB — CBC (HEMOGRAM)
Hematocrit: 43 % (ref 38.0–50.0)
Hemoglobin: 14.4 g/dL (ref 13.0–18.0)
MCH: 33 pg (ref 27.3–33.6)
MCHC: 33.2 g/dL (ref 32.2–36.5)
MCV: 100 fL — ABNORMAL HIGH (ref 81–98)
Platelet Count: 274 10*3/uL (ref 150–400)
RBC: 4.36 10*6/uL — ABNORMAL LOW (ref 4.40–5.60)
RDW-CV: 12.6 % (ref 11.6–14.4)
WBC: 8.3 10*3/uL (ref 4.3–10.0)

## 2020-12-04 LAB — BASIC METABOLIC PANEL
Anion Gap: 10 (ref 4–12)
Calcium: 9.2 mg/dL (ref 8.9–10.2)
Carbon Dioxide, Total: 28 meq/L (ref 22–32)
Chloride: 97 meq/L — ABNORMAL LOW (ref 98–108)
Creatinine: 0.87 mg/dL (ref 0.51–1.18)
Glucose: 142 mg/dL — ABNORMAL HIGH (ref 62–125)
Potassium: 4.5 meq/L (ref 3.6–5.2)
Sodium: 135 meq/L (ref 135–145)
Urea Nitrogen: 18 mg/dL (ref 8–21)
eGFR by CKD-EPI: >60 mL/min/{1.73_m2} (ref >59)

## 2020-12-04 LAB — LAB ORDER, MICROBIOLOGY

## 2020-12-04 LAB — COVID-19 CORONAVIRUS QUALITATIVE PCR: COVID-19 Coronavirus Qual PCR Result: NOT DETECTED

## 2020-12-04 LAB — VANCOMYCIN, TROUGH LEVEL: Vancomycin, Trough Level: 12.6 ug/mL (ref 5.0–20.0)

## 2020-12-04 MED ORDER — MORPHINE SULFATE (PF) 2 MG/ML IV/IJ SOLN WRAPPER
2.0000 mg | Freq: Four times a day (QID) | Status: DC | PRN
Start: 2020-12-04 — End: 2020-12-10
  Administered 2020-12-04 – 2020-12-05 (×2): 2 mg via INTRAVENOUS
  Administered 2020-12-06 – 2020-12-10 (×7): 4 mg via INTRAVENOUS
  Filled 2020-12-04: qty 2
  Filled 2020-12-04: qty 1
  Filled 2020-12-04 (×3): qty 2
  Filled 2020-12-04: qty 1
  Filled 2020-12-04 (×3): qty 2

## 2020-12-04 MED ORDER — OXYCODONE HCL 5 MG OR TABS
2.5000 mg | ORAL_TABLET | ORAL | Status: DC | PRN
Start: 2020-12-04 — End: 2020-12-10
  Administered 2020-12-04 – 2020-12-09 (×19): 5 mg via ORAL
  Filled 2020-12-04 (×19): qty 1

## 2020-12-04 MED ORDER — MORPHINE SULFATE (PF) 10 MG/ML IV/IJ SOLN WRAPPER
2.0000 mg | Freq: Four times a day (QID) | Status: DC | PRN
Start: 2020-12-04 — End: 2020-12-04

## 2020-12-04 NOTE — Progress Notes (Addendum)
Progress Note     Ronnie Mason") - DOB: [birth year] 70 year old male)  Gender Identity: Male  Preferred Pronouns: he/him/his  Admit Date: 12/02/2020  Code Status: Full Code       CHIEF CONCERN / IDENTIFICATION:  Ronnie Mason is a 70 year old male with a history of hypertension, hyperlipidemia, AUD on naltrexone, and chronic pain who presents R foot abscess, s/p IV antibiotics, currently afebrile and hemodynamically stable with plan for I&D by podiatry  5/3.      SUBJECTIVE   INTERVAL HISTORY:  - Feeling well today, minimal pain on the side of his right foot which is his chronic pain.  - Asking when he can go home  - I&D in the OR done by podiatry yesterday. Continues on IV antibiotics.    SCHEDULED MEDICATIONS:   amLODIPine, 5 mg, BID  .  atorvastatin, 10 mg, Daily  .  cefTRIAXone, 2 g, Daily  .  DULoxetine, 60 mg, BID  .  heparin, 5,000 units, q8h SCH  .  lidocaine, 1 Mason, Daily  .  lisinopril, 20 mg, q12h  .  [Held By Provider] naltrexone, 380 mg, q21 days  .  pregabalin, 300 mg, BID  .  primidone, 50 mg, BID  .  vancomycin, 1.5 g, q12h    INFUSED MEDICATIONS:  dextrose 5% and sodium chloride 0.45%, 100 mL/hr, Continuous, Last Rate: 100 mL/hr (12/03/20 7425)  .  lactated ringers, 10-25 mL/hr, Continuous, Last Rate: 10 mL/hr (12/03/20 1613)  .  lactated ringers, 50 mL/hr, Continuous, Last Rate: 50 mL/hr (12/03/20 1849)     PRN MEDICATIONS:  .  acetaminophen, 650 mg, q4h PRN  .  diclofenac, 4 g, QID PRN  .  morphine, 2-4 mg, q6h PRN  .  ondansetron, 4 mg, q8h PRN  .  ondansetron, 4 mg, q8h PRN  .  oxyCODONE, 2.5-5 mg, q4h PRN  .  senna, 17.2 mg, BID PRN       OBJECTIVE     Vitals (Most recent in last 24 hrs)     T: 36.9 C (12/04/20 1223)  BP: 123/76 (12/04/20 1223)  HR: 76 (12/04/20 1223)  RR: 18 (12/04/20 1223)  SpO2: 96 % (12/04/20 1223) Room air  T range: Temp  Min: 36.2 C  Max: 37.3 C  Admit weight: (!) 108.9 kg (240 lb) (12/02/20 1500)  Last weight: (!) 108.9 kg (240 lb)  (12/02/20 1500)       I&Os:     Intake/Output Summary (Last 24 hours) at 12/04/2020 1430  Last data filed at 12/04/2020 0900  Intake 1060 ml   Output 1270 ml   Net -210 ml       Physical Exam  General:  Sitting up in bed, fully dressed, alert and oriented, pleasant and conversant.  Skin: warm and dry  CV:  RRR, no murmur  Lungs:  CTA bil  LE:  Foot with bandage in place, erythema on lower leg above bandage.    Labs (last 24 hours):   Chemistries  CBC  LFT  Gases, other   135 97 18 142   14.4   AST: - ALT: -  -/-/-/-  -/-/-/-   4.5 28 0.87   8.30 >< 274  AP: - T bili: -  Lact (a): - Lact (v): -   eGFR: >60 Ca: 9.2   43   Prot: - Alb: -  Trop I: - D-dimer: -   Mg: - PO4: -  ANC: -     BNP: - Anti-Xa: -     ALC: -    INR: -              ASSESSMENT/PLAN        Ronnie Mason is a 70 year old male with a history of hypertension, hyperlipidemia, AUD on naltrexone, and chronic pain who presents R foot abscess, s/p IV antibiotics, currently afebrile and hemodynamically stable with plan for I&D by podiatry 5/3.     #Right foot abscess  #Right foot cellulitis  LRINEC score 4(CRP >150 mg/L). Afebrile and HDS. MRI of right footdeclined by patient due to concern of cost Now s/pwound irrigation,CTX and vancomycin and s/p surgical debridement on 5/3 by podiatry.  Wound culture with 3+ Staph aureus. Per podiatry, he should continue on IV antibiotics with plans for wound vac and delayed closure.  - Continue vancomycin and ceftriaxone (will check with podiatry about just keeping vanc)  -f/u blood cultures, NGTD x 2 days  -f/u wound cultures  -pain control with PO analgesicas below and low dose oxycodone, IV morphine for breakthrough pain     #Chronic pain   Followed by pain clinic, last seen 11/22/20. No changes in medication regimen.   -Continue pregabalin 300 mg BID  -Continue duloxetine 60 mg BID  -Continue acetaminophen up to 2 gm per day  -Continue diclofenac gel    #AUD   11/13/20: 380 mg Naltrexone XR  administered to L dorso-gluteal. Prefers dorsogluteal injections to right side, okay with ventro-gluteal or dorsogluteal to left side. Reports last drank about 8 months ago.   -Next injection due5/06/2021    #Hypertension  #Hyperlipidemia   Normotensive in ED. No cardiac symptoms reported. Stable on current medication regimen.   -continueamlodipine5 mg, BID  -continuelisinopril20 mg, BID  -continue atorvastatin10 mg, daily    Inpatient Checklist:  Fluids/Electrolytes/Nutrition: regular diet  Prophylaxis:SQH   Lines/Drains/Airways:Left arm PIV x 1   Disposition:acute care   Code Status: FULL   Contacts: Primary Emergency Contact: Ronnie Mason", Home Phone: [phone]  PCP: Ronnie Patch, MD       Addendum:  Pt reports that his last tetanus vaccine was 09/2017

## 2020-12-04 NOTE — Progress Notes (Signed)
Vancomycin - Pharmacy Dosing    Pharmacy has been consulted to manage vancomycin dosing for Ronnie Mason.    Vancomycin indication: Skin/Soft Tissue Infection    Current regimen:  1.5 g IV every 12 hours started on 12/03/2020    Relevant Clinical Data:    Weight: 108.9 kg    Creatinine (mg/dL)   Date/Time Value   93/90/3009 0810 0.87   12/03/2020 0659 0.90   12/02/2020 1710 1.06       Estimated CrCl: 119  mL/min    Renal replacement therapy: n/a    Vancomycin, Trough Level (ug/mL)   Date/Time Value   12/04/2020 1037 12.6            Assessment/Plan:    Nephrotoxicity risk factor: Obesity.     Vancomycin level is therapeutic.  Vancomycin level goal: 10-20 mcg/mL    Vancomycin dose: continue current regimen- Vanco 1.5 g iv q12h  Vancomycin level will be drawn on 12/05/2020 at 2200.  Pharmacy will continue to follow clinical progress daily, monitoring for nephrotoxicity and adjusting regimen as necessary.     Jenella Craigie [place] Citizen, PharmD

## 2020-12-04 NOTE — Progress Notes (Signed)
Physical Therapy  Physical Therapy Evaluation/Treatment    Patient Name: Ronnie Mason  MRN: [id number]  Today's Date: 12/04/2020    General Visit Information  General  Documentation Type: Initial Eval  Treatment Start Time: 1558  Treatment End Time: 1636  Treatment Duration (min): 38 Mins  Family/Caregiver Present: No    Subjective  Subjective  Patient Report/Self-Assessment: Pt agreeable to work with PT.  Patient Stated Goal: To return home when ready.    Physical Therapy Diagnosis  PT Diagnosis: dysmobility r/t R foot cellulitis now s/p wound incision/drainage  PT Diagnosis (Continued): NWB R LE    Patient Active Problem List   Diagnosis   . Syncope   . Hypotension   . Carotid Sinus Hypersensitivity   . URI (upper respiratory infection)   . Cervicalgia   . Hyperlipidemia   . Chronic pain   . Bee sting allergy   . Numbness and tingling of leg   . Chronic back pain   . B12 deficiency   . Chronic renal insufficiency, stage III (moderate) (HCC)   . Back pain, lumbosacral   . Lumbar radiculopathy, chronic   . Neck pain   . Fall   . Tremor   . Sensory neuropathy   . 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 myelopathy or radiculopathy   . Idiopathic peripheral neuropathy   . 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)   . History of alcohol dependence (HCC)   . Cerebral ventriculomegaly   . Cerebellar hypoplasia (HCC)   . Essential hypertension   . Essential tremor   . Hx of spontan intraparenchymal intracran bleed assoc with hypertension   . Alcohol abuse   . Intraparenchymal hemorrhage of brain (HCC)   . 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 Balance problem   . Chronic pain of both shoulders   . Vitamin D deficiency   . Difficulty in walking, not elsewhere classified   . Impaired mobility and ADLs   . 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(HCC)   . Primary osteoarthritis of right hip   . Mild vascular neurocognitive disorder (HCC)   . Right lumbar  radiculitis   . Greater trochanteric pain syndrome of right lower extremity   . Complex tear of medial meniscus of left knee as current injury   . Primary osteoarthritis of left knee   . Acute left-sided low back pain without sciatica   . Chronic left-sided low back pain without sciatica   . Enuresis   . Closed fracture of one rib of right side   . MRSA (methicillin resistant staph aureus) culture positive   . Myofascial pain   . Macrocytosis without anaemia   . Abscess of foot   . Cellulitis and abscess of foot   . Abscess       Past Medical History:   Diagnosis Date   . Pain of right hip joint 01/23/2016   . Intraparenchymal hemorrhage of brain (HCC) 2017   . 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 Allergic rhinitis due to allergen    . Carotid Sinus Hypersensitivity    . Disc disorder of lumbosacral region    . Dizziness    . Heart murmur    . Hip injury    . HYPERTENSION     . HYPOTENSION     . Nosebleed    . Strep throat    . Stroke (HCC)    .  Syncope    . 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(HCC)    . URI (upper respiratory infection)        Past Surgical History:   Procedure Laterality Date   . L meniciscus Left 07/2017    by Dr Marcelle Overlie , re did left menisicus    . PR ANES; COLONOSCOPY  2002    repeat in 3 years   . PR ANES; COLONOSCOPY & POLYPECTOMY  11/18/2007    repeat in 3 years   . PR HALLUX RIGIDUS W/CHEILECTOMY 1ST MP JT W/O IMPLT Right 10/21/2017    Dr. Donn Pierini   . PR UNLISTED PROCEDURE FEMUR/KNEE     . PR UNLISTED PROCEDURE HANDS/FINGERS     . PR UNLISTED PROCEDURE SPINE  2013    rfa to c3 to c 7    . TOE SURGERY Right 02/09/2019   . toe surgery  Right 01/12/2019    Dr Hilarie Fredrickson        Home Living  Home Living  Type of Home: House (3 story)  Entry Stairs: 13  Entry Stairs Railings: Right railing  Indoor Stairs: 10 (main level down to basement)  Indoor Stairs Railings: Right railing  Lives With: Spouse  Mobility Equipment Owned: Single point cane;Front wheeled or  pick-up walker;Axillary crutches (could rent power scooter)  Durable Medical Equipment Owned: Grab bars in shower;Grab bars around toilet    Prior Level of Function  Prior Function  Prior Level of Function: Independent with all functional mobility and ADLs  Details on Prior Level of Function: indep with ADLs. amb with no AD.  Type of Occupation: retired Land Available at Home: 24 hour assistance      Pain  Pain Assessment  Pain Assessment: No/denies pain  Pain Score: 2  Pain Descriptor Scale: Mild  Pain Intensity: Activity  Pain Location: foot incision  Pain Interventions: Medication (See MAR)  Response to Interventions: Yes    Cognition  Cognition  Overall Cognitive Status: Impaired  Arousal/Alertness: Appropriate responses to stimuli  Orientation Level: Oriented X4  Safety Judgment: Decreased awareness of need for safety  Deficits: Decreased awareness of deficits  Memory: Decreased recall of precautions  Cognition Comments: Not able to observe precautions    Extremity Assessments  RUE Assessment  RUE Assessment: Within Functional Limits              LUE Assessment  LUE Assessment: Within Functional Limits              RLE Assessment  RLE Assessment: Exceptions to [facility]  RLE Assessment Comments: NWB R LE              LLE Assessment  LLE Assessment: Within Functional Limits                Sensation     Proprioception     Coordination     Perception     Vision/Vestibular     Anthropometric Measurements    Static Sitting Balance  Static Sitting Balance  Static Sitting-Balance Support: Bilateral upper extremity supported;No upper extremity supported;Feet supported;Trunk unsupported  Static Sitting Level of Assistance: Independent  Dynamic Sitting Balance  Dynamic Sitting Balance  Dynamic Sitting-Balance Support: Bilateral upper extremity supported;No upper extremity supported;Feet supported;Trunk unsupported  Dynamic Sitting Balance Level of Assist: Independent  Static Standing Balance  Static Standing  Balance  Static Standing Balance: Bilateral upper extremity supported  Static Standing Balance Level of Assistance: Supervision / Stand by assistance  Static Standing Balance  Comment: FWW, crutches  Dynamic Standing Balance     Activity Tolerance  Activity Tolerance  Endurance: Endurance does not limit participation in activity    Bed Mobility     Transfers  Transfers  Sit<>Stand: Supervision / Stand by assistance;Assistive device used  Sit<>Stand Assistive Device Details: Front wheeled walker;Axillary crutches  Wheelchair Assessment     Ambulation  Ambulation  Distance: 20  Assistance Needs: Supervision / Stand by assistance (intermittent CGA d/t pt being impulsive)  Assistive Device Used: Front wheeled walker;Axillary crutches  Gait quality/descriptors: Other (Comment) (non-compliant with NWB precautions, demo PWB thru R heel)  10 Meter Walk Test     15 Foot Walk Test     2 Minute Walk Test     6 Minute Walk Test     Stairs       Outcome Measures  [place] AM-PAC "6 Clicks" Basic Mobility Outcome Measure  Help needed climbing 3-5 steps with a railing?: A Lot  Help needed walking in hospital room?: A Little  Help needed moving to and from a bed to chair (including a wheelchair)?: A Little  Difficulty moving from lying on back to sitting on the side of the bed?: None  Difficulty sitting down on and standing up from a chair with arms (e.g., wheelchair, bedside commode, etc,.)?: None  Difficulty turning over in bed (including adjusting bedclothes, sheets and blankets)?: None  Score: 20  Confusion Assessment Method-ICU (CAM-ICU)  Feature 3: Altered Level of Consciousness: Negative  [place] Agitation Sedation Scale (RASS)  [place] Agitation Sedation Scale (RASS): Alert and calm    Treatment  Therapeutic Exercise     Therapeutic Activity     Neuromuscular Re-Education     Gait Training  Gait Training  Gait Training Time Entry: 15  Gait Training Activity 1: amb with FWW/ SBA bed <> toilet, cues for NWB however  pt non-compliant and demo PWB in R heel  Gait Training Activity 2: amb with crutches/ SBA (intermittent CGA as pt can be impulsive) in room, cues for sequencing and NWB however pt non-compliant and demo PWB in R heel  Gait Training Activity 3: edu on NWB precautions  Gait Training Activity 4: edu on recommendations  Other Activities       Assessment/Plan  PT Assessment Results: Decreased strength;Decreased endurance;Impaired balance;Decreased mobility  Impact on Function and Participation: Pt is a 70 y/o M admitted with Right foot abscess now s/p I&D on 12/03/2020. PMH: hypertension, hyperlipidemia, AUD on naltrexone, and chronic pain. Precautions: NWB R LE. At baseline, pt lives with spouse in a 3 story house with 13 STE and 10 stairs inside (main level down to basement but states he can stay on main level at DC). Pt was indep with all mobility and ADLs and amb with no AD. Today pt demonstrated modI bed mobility, SBA transfers with FWW/ crutches, and amb in room with FWW/ crutches SBA (CGA at times when impulsive). Pt unable to adhere to NWB precautions and tends to WB through R heel despite repeated instruction. Appears unsteady when attempting hopping or swing through gait. IP PT to continue to progress amb, determine LRAD (FWW vs crutches), and complete stair training. PT to progress/ update DC recs.  Prognosis: Good        Treatment/Interventions: Therapeutic exercise;Therapeutic functional activities;Gait/Stairs training;Patient/family training;Equipment eval/education  PT Plan: Skilled PT  PT Dosage: 5-6x/week  PT Discharge Recommendations: Follow up with PT at next level of care (to be determined by primary team)  Equipment Recommended: Front wheeled walker;Axillary crutches    Barriers to Discharge  Barriers to Discharge: stairs, NWB status      Goals  STG  Goal: Transfer with AD;Independent  Estimated Completion Date: 12/06/20  Goal Status: Progressing  STG  Goal: Amb with AD;Independent  Estimated  Completion Date: 12/06/20  Goal Status: Progressing  STG  Goal: Stairs;Set up or clean up assistance (13 stairs with 2 HR/ 1 HR)  Estimated Completion Date: 12/06/20                                                 Epimenio Foot, PT  12/04/2020

## 2020-12-04 NOTE — Consults (Signed)
Consult Note     Ronnie Mason Ronnie Henri") - DOB: 1951/01/18  Admit Date: 12/02/2020- Length of Stay: 0 day(s)  Code Status: Full Code     Inpatient Consult to Wound Therapy  Consult performed by: Nicholaus Bloom, ARNP  Consult ordered by: Elie Goody, DPM          CHIEF CONCERN / IDENTIFICATION:  Ronnie Mason is a 70 year old male with history ofhypertension,hyperlipidemia,AUDon naltrexone,and chronic pain who presents R foot abscess, s/p IV antibiotics, currently afebrile and hemodynamically stable with plan for I&D by podiatry  5/3, seen today for wound consult / vac placement as requested by Dr. Maudie Mercury       SUBJECTIVE   HISTORY OF PRESENT ILLNESS:   Hx obtained by chart review, seen and d/w RN and patient at bedside.  Pt reported an abrupt onset of foot pain when up on his feet working outside wearing boots, felt like a pop ~5-6 days ago, he previously had a foot fracture that required surgical repair.   Dr Maudie Mercury evaluated, and yesterday took to OR for formal I&D of large abscess of right plantar arch to plantar facia / tendon. Request is for vac placement prior to evaluation for delayed primary closure  Coordinated premedication for pain with RN. Pt states neuropathy is painful, he has some involuntary jerking.     Review of Systems   Constitutional: Negative for chills and fever.   Respiratory: Negative for cough and shortness of breath.    Gastrointestinal: Negative for diarrhea, nausea and vomiting.   Skin: Positive for wound.   Neurological:        Peripheral neuropathy     HISTORY   Problem List   Diagnosis   . Syncope   . Hypotension   . Carotid Sinus Hypersensitivity   . URI (upper respiratory infection)   . Cervicalgia   . Hyperlipidemia   . Chronic pain   . Bee sting allergy   . Numbness and tingling of leg   . Chronic back pain   . B12 deficiency   . Chronic renal insufficiency, stage III (moderate) (HCC)   . Back pain, lumbosacral   . Lumbar radiculopathy, chronic    . Neck pain   . Fall   . Tremor   . Sensory neuropathy   . 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 myelopathy or radiculopathy   . Idiopathic peripheral neuropathy   . 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 ([place])   . History of alcohol dependence ([place])   . Cerebral ventriculomegaly   . Cerebellar hypoplasia ([place])   . Essential hypertension   . Essential tremor   . Hx of spontan intraparenchymal intracran bleed assoc with hypertension   . Alcohol abuse   . Intraparenchymal hemorrhage of brain ([place])   . 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 Balance problem   . Chronic pain of both shoulders   . Vitamin D deficiency   . Difficulty in walking, not elsewhere classified   . Impaired mobility and ADLs   . 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([place])   . Primary osteoarthritis of right hip   . Mild vascular neurocognitive disorder ([place])   . Right lumbar radiculitis   . Greater trochanteric pain syndrome of right lower extremity   . Complex tear of medial meniscus of left knee as current injury   . Primary osteoarthritis of left knee   . Acute left-sided low back  pain without sciatica   . Chronic left-sided low back pain without sciatica   . Enuresis   . Closed fracture of one rib of right side   . MRSA (methicillin resistant staph aureus) culture positive   . Myofascial pain   . Macrocytosis without anaemia   . Abscess of foot   . Cellulitis and abscess of foot   . Abscess       Past Surgical History:   Procedure Laterality Date   . L meniciscus Left 07/2017    by Dr Matthew Saras , re did left menisicus    . PR ANES; COLONOSCOPY  2002    repeat in 3 years   . PR ANES; COLONOSCOPY & POLYPECTOMY  11/18/2007    repeat in 3 years   . PR HALLUX RIGIDUS W/CHEILECTOMY 1ST MP JT W/O IMPLT Right 10/21/2017    Dr. Megan Salon   . PR UNLISTED PROCEDURE FEMUR/KNEE     . PR UNLISTED PROCEDURE HANDS/FINGERS     . PR UNLISTED PROCEDURE SPINE  2013    rfa to c3 to c 7    . TOE  SURGERY Right 02/09/2019   . toe surgery  Right 01/12/2019    Dr Wilmon Arms        Social History     Tobacco Use   . Smoking status: Never Smoker   . Smokeless tobacco: Former Chief Strategy Officer and Sexual Activity   . Alcohol use: Yes     Alcohol/week: 8.0 - 12.0 standard drinks     Types: 8 - 12 Standard drinks or equivalent per week     Comment: drinks a night max   . 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  Non-smoker, 14 or more drinks/week almost exclusively wine with dinner.              Family History   Problem Relation Age of Onset   . Cancer No Hx Of    . Colon Cancer Father    . Heart (other) Mother         MI   . Other Family Hx Other         myelodysplasia          MEDICATIONS:   SCHEDULED MEDICATIONS:   amLODIPine, 5 mg, BID  .  atorvastatin, 10 mg, Daily  .  cefTRIAXone, 2 g, Daily  .  DULoxetine, 60 mg, BID  .  heparin, 5,000 units, q8h SCH  .  lidocaine, 1 patch, Daily  .  lisinopril, 20 mg, q12h  .  [Held By Provider] naltrexone, 380 mg, q21 days  .  pregabalin, 300 mg, BID  .  primidone, 50 mg, BID  .  vancomycin, 1.5 g, q12h    INFUSED MEDICATIONS:  dextrose 5% and sodium chloride 0.45%, 100 mL/hr, Continuous, Last Rate: 100 mL/hr (12/03/20 9509)  .  lactated ringers, 10-25 mL/hr, Continuous, Last Rate: 10 mL/hr (12/03/20 1613)  .  lactated ringers, 50 mL/hr, Continuous, Last Rate: 50 mL/hr (12/03/20 1849)     PRN MEDICATIONS:  .  acetaminophen, 650 mg, q4h PRN  .  diclofenac, 4 g, QID PRN  .  morphine, 2-4 mg, q6h PRN  .  ondansetron, 4 mg, q8h PRN  .  ondansetron, 4 mg, q8h PRN  .  oxyCODONE, 2.5-5 mg, q4h  PRN  .  senna, 17.2 mg, BID PRN      ALLERGIES:   Adhesives, Bee venom, Gabapentin, and Methocarbamol     OBJECTIVE     Vitals (Arrival)      T: (!) 35.9 C (12/02/20 1500)  BP: 114/75 (12/02/20 1501)  HR: 81 (12/02/20 1500)  RR: 16 (12/02/20 1500)  SpO2: 95 %  (12/02/20 1500)   Vitals (Most recent)   T: 36.9 C  BP: 123/76  HR: 76   RR: 18  SpO2: 96 %  T range: Temp  Min: 36.2 C  Max: 37.3 C  Wt 240 lb (108.863 kg)     (no height taken for this visit)     Body mass index is 29.6 kg/m (pended).       Physical Exam  Vitals and nursing note reviewed.   Constitutional:       Comments: Alert conversant male NAD, seen in the company of his RN   Musculoskeletal:      Comments: Trace if any LE edema, focused right foot exam, 2+ DP/PT pulses.   Skin:     Comments: See detailed wound descriptions below, high resolution wound photos in media tab          Wound Assessment:      Wound 12/03/20  Foot Right (Active)   Wound Image     12/04/20 1443   Site Assessment Red viable subq tissue and tendon visible 12/04/20 1443   Peri-Wound Assessment Erythematous 12/04/20 1443   Drainage Description Sanguineous 12/04/20 1443   Drainage Amount Moderate - venous ooze controlled with pressure 12/04/20 1443   Dressing Petrolatum gauze;NPWT black foam;Ace Wrap;Gauze;Rolled gauze 12/04/20 1443   Dressing Changed New 12/03/20 0830   Dressing Status Clean;Dry;Intact 12/04/20 0900   Wound Length (cm) 11 cm 12/04/20 1443   Wound Width (cm) 3 cm 12/04/20 1443   Wound Surface Area (cm^2) 33 cm^2 12/04/20 1443   Wound Depth (cm) 2 cm 12/04/20 1443   Wound Volume (cm^3) 66 cm^3 12/04/20 1443   Non-staged Wound Description Full thickness 12/04/20 1443   Wound Vac Setting/Mode Continuous 12/04/20 1443   Total Pieces of Foam Used 1 12/04/20 1443     Procedure:    Vac dressing application to right plantar foot wound,  33 sq cm   On approach vac surgical dressing is intact, taken down carefully with NS soaking as there was moderate bloody drainage soaking the packing and ABD pad and kerlix. Wounds irrigated with NS pressure applied to control venous oozing from freshly excised margins, exudates wiped free with gauze, peri-wound skin prepped with 60M barrier film, adaptic contact layer placed to wound bed  followed by single piece black foam dressing  cut to fill wound opening,  secured with occlusive drape, bridge placed to dorsal foot, suction hub applied, and attached to 114mHg continuous suction via the wound vac.  Pt tolerated the procedure fairly well. He preferred to watch initially, but ultimately needed to lay flat and not observe due to the wound severity / tolerance. Staff did provide additional IV push pain medication for pain control, and additional assistant was required to assist with holding leg as pt would move it wildly and unpredictably which he attributed to neuropathy pains.      Labs (last 24 hours):   Chemistries  CBC  LFT  Gases, other   135 97 18 142   14.4   AST: - ALT: -  -/-/-/-  -/-/-/-   4.5 28  0.87   8.30 >< 274  AP: - T bili: -  Lact (a): - Lact (v): -   eGFR: >60 Ca: 9.2   43   Prot: - Alb: -  Trop I: - D-dimer: -   Mg: - PO4: -  ANC: -     BNP: - Anti-Xa: -     ALC: -    INR: -      XR Foot 3 View Right  Order: [id number]   Performed 12/02/2020 13:12    Status: Final result    Visible to patient: Yes (seen)    0 Result Notes    Details    Reading Physician Reading Date Result Priority   Clifton Custard, MD  [phone] 12/03/2020 Routine   Laureen Abrahams, MD  [phone] 12/03/2020      Narrative & Impression  EXAMINATION:  XR FOOT 3 VW RIGHT    CLINICAL INDICATION:  right foot pain     COMPARISON:   Right foot radiograph 08/08/2019.    FINDINGS AND   IMPRESSION  As before, patient is status post arthrodesis of the first MTP joint. There is interval osseous modeling about the site of prior surgical hardware.    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 Remaining joint spaces are well-preserved.    Scattered vascular calcifications.    I have personally reviewed the images and agree with the report (or as edited).     Contains abnormal dataCulture Tissue, Bact w/ Stain  Order: [id number]   Collected 12/03/2020 17:13    Status:  Preliminary result    Visible to patient: No (not released)   Specimen Information: Tissue    Foot, Right~PLANTAL        0 Result Notes    Component    Special Requests Includes Anaerobic screen P    Gram Smear 3+   Polymorphonuclear cells  P      Gram Smear No organisms seen P    Culture Abnormal  2+   Staphylococcus aureus, coagulase positive   :   SENSITIVITY TO [facility] Dept of Lab Med              Specimen Collected: 12/03/20 17:13 Last Resulted: 12/04/20 12:47           5/2 Sed rate 58, CRP 215 (!)     ASSESSMENT/PLAN     Right foot abscess / cellulitis  Right foot surgical wound - tendon level s/p I&D 12/03/20, Dr. Maudie Mercury    -applied wound vac as above - adaptic contact layer, black foam, run at 61mHg cont suction due to some post-surgical oozing which was controlled with pressure prior to application. Plan will be for reevaluation with Dr. KMaudie Mercuryfor delayed primary closure    -continue antibiotics, follow cx results, med management per attending/consulting teams    -appreciate request for consult wound care will remain available as needed for care during this admit.

## 2020-12-04 NOTE — Progress Notes (Signed)
Occupational Therapy Evaluation     Patient Name: Ronnie Mason  MRN: [id number]  Today's Date: 12/04/2020    General Visit Information  General  Documentation Type: Initial Eval  Treatment Start Time: 0945  Treatment End Time: 1010  Treatment Duration (min): 25 Minutes  Family/Caregiver Present: No  OT Last Visit  OT Received On: 12/04/20  OT Diagnosis: impaired self care    Patient Active Problem List   Diagnosis   . Syncope   . Hypotension   . Carotid Sinus Hypersensitivity   . URI (upper respiratory infection)   . Cervicalgia   . Hyperlipidemia   . Chronic pain   . Bee sting allergy   . Numbness and tingling of leg   . Chronic back pain   . B12 deficiency   . Chronic renal insufficiency, stage III (moderate) (HCC)   . Back pain, lumbosacral   . Lumbar radiculopathy, chronic   . Neck pain   . Fall   . Tremor   . Sensory neuropathy   . 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 myelopathy or radiculopathy   . Idiopathic peripheral neuropathy   . 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)   . History of alcohol dependence (HCC)   . Cerebral ventriculomegaly   . Cerebellar hypoplasia (HCC)   . Essential hypertension   . Essential tremor   . Hx of spontan intraparenchymal intracran bleed assoc with hypertension   . Alcohol abuse   . Intraparenchymal hemorrhage of brain (HCC)   . 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 Balance problem   . Chronic pain of both shoulders   . Vitamin D deficiency   . Difficulty in walking, not elsewhere classified   . Impaired mobility and ADLs   . 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(HCC)   . Primary osteoarthritis of right hip   . Mild vascular neurocognitive disorder (HCC)   . Right lumbar radiculitis   . Greater trochanteric pain syndrome of right lower extremity   . Complex tear of medial meniscus of left knee as current injury   . Primary osteoarthritis of left knee   . Acute left-sided low back pain without  sciatica   . Chronic left-sided low back pain without sciatica   . Enuresis   . Closed fracture of one rib of right side   . MRSA (methicillin resistant staph aureus) culture positive   . Myofascial pain   . Macrocytosis without anaemia   . Abscess of foot   . Cellulitis and abscess of foot   . Abscess       Past Medical History:   Diagnosis Date   . Pain of right hip joint 01/23/2016   . Intraparenchymal hemorrhage of brain (HCC) 2017   . 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 Allergic rhinitis due to allergen    . Carotid Sinus Hypersensitivity    . Disc disorder of lumbosacral region    . Dizziness    . Heart murmur    . Hip injury    . HYPERTENSION     . HYPOTENSION     . Nosebleed    . Strep throat    . Stroke (HCC)    . Syncope    . 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(HCC)    . URI (upper respiratory infection)        Past Surgical History:   Procedure Laterality Date   . L  meniciscus Left 07/2017    by Dr Marcelle Overlie , re did left menisicus    . PR ANES; COLONOSCOPY  2002    repeat in 3 years   . PR ANES; COLONOSCOPY & POLYPECTOMY  11/18/2007    repeat in 3 years   . PR HALLUX RIGIDUS W/CHEILECTOMY 1ST MP JT W/O IMPLT Right 10/21/2017    Dr. Donn Pierini   . PR UNLISTED PROCEDURE FEMUR/KNEE     . PR UNLISTED PROCEDURE HANDS/FINGERS     . PR UNLISTED PROCEDURE SPINE  2013    rfa to c3 to c 7    . TOE SURGERY Right 02/09/2019   . toe surgery  Right 01/12/2019    Dr Hilarie Fredrickson        Subjective: pleasant. Poor insight in NWB status.       Home Living  Home Living  Type of Home: House  Entry Stairs: 2  Entry Stairs Railings: Right railing  Indoor Stairs: 3 story house  Indoor Stairs Railings: Right railing  Lives With: Spouse  Bathroom Shower/Tub: Walk-in Physiological scientist: ADA height  Mobility Equipment Owned: Single point Engineer, water Owned: Grab bars in shower;Grab bars around toilet    Prior Function  Prior Function  BADLs: Independent  IADLs:  Independent  Functional Mobility: Independent  Type of Occupation: retired Land Available at Home: 24 hour assistance    Pain  Pain Assessment  Pain Assessment: No/denies pain  Pain Score: 0  Pain Descriptor Scale: None  Pain Intensity: Rest  Pain Location: right foot  Pain Orientation: Right  Pain Descriptors: Aching  Pain Frequency: Constant/continuous      Extremity Assessments  RUE Assessment  RUE Assessment: Within Functional Limits                       LUE Assessment  LUE Assessment: Within Functional Limits                                                          Self-Care/Home Management  Self-Care/Home Management  Upper Body Dressing: Independent  Lower Body Dressing: Independent    Cognition  Cognition  Overall Cognitive Status: Impaired  Arousal/Alertness: Appropriate responses to stimuli  Orientation Level: Oriented X4  Safety Judgment: Decreased awareness of need for safety  Deficits: Decreased awareness of deficits  Memory: Decreased recall of precautions  Cognition Comments: Not able to observe precautions              Transfers  Transfers  Sit<>Stand: Supervision / Stand by assistance  Toilet Transfer: Supervision / Stand by assistance       Assessment/Plan  OT Assessment Results: Impaired ADL status;Impaired functional mobility;Impaired cognition;Impaired safety and/or judgement during ADL's      Pt is 70 yo male admitted for I&D of plantar arch abscess.   Precautions: NWB right foot  Pt lives c wife in 3 story house, 2 STE, 1 HR. WIS, GBS, ADA height toilet c GBS. Mod I BADLs, HLS. Amb c spc.     Today pt received sitting up on EOB. Alert, grossly oriented. B UEs WFL. Pt has been reportedly amb around room and hallways independently s AD. Verified that pt is NWB right foot, provided FWW and and instructed pt to  amb c FWW and NWB. Pt was very unsteady c hopping, and was essentially unable to observe NWB as he kept putting his foot down for balance.   Pt is limited by impaired  balance and is unable to observe precautions. Recommend PT referral for safe ambulation, otherwise pt appears to be at baseline for BADls.  IP OT will discuss pt's needs c wife, then dc OT.     Prognosis: Good  Barriers to Discharge: Inaccessible home environment       Treatment Interventions: Self-care/Home management     OT Plan: Skilled OT;PT consult     OT Frequency: 5-6x/week                Goals                                                              Additional Goal  STG Goal Details: Mod I BADls                   Genevive Bi, [place]  12/04/2020

## 2020-12-04 NOTE — Nursing Note (Signed)
GENERAL PATIENT SHIFT NOTE:    PO Intake: Good appetite     HY:IFOYDXAJ nausea .    OI:NOMVEHM   Per urinal .    Oxygen Needs:On RA  No SOB  And resp distress noted.    Vital Trends Today: stable     Activity:Prior to place wound vac patient was ambulating in room and comes to nursing station  He is non compliant Reported to MD . Seen by PT . MD is aware  No weight bearing status .     Pain: Right foot incision pain managed with prn oxycodone , once medicated with iv morphine  2 mg   during wound vac placement .    Skin: Wound vac  at 75  Continues  Placed  by wound care nurse.     PRN Meds Given:oxycodone / morphine iv.    Patient Concerns:    Other: Continues on  Iv abx  Vancomycin .

## 2020-12-04 NOTE — Nursing Note (Signed)
Patient Summary  70 y/o male admitted s/p R foot plantar I&D.     Hx: HTN    A/O x4. Pt refuses bed alarm on. Ambulates independently down the hall independently.  Dressing to right foot clean, dry, intact. Denies pain. No acute distress.

## 2020-12-05 ENCOUNTER — Inpatient Hospital Stay (HOSPITAL_COMMUNITY): Payer: Medicare PPO

## 2020-12-05 DIAGNOSIS — L03119 Cellulitis of unspecified part of limb: Secondary | ICD-10-CM

## 2020-12-05 DIAGNOSIS — L02619 Cutaneous abscess of unspecified foot: Secondary | ICD-10-CM

## 2020-12-05 DIAGNOSIS — G3184 Mild cognitive impairment, so stated: Secondary | ICD-10-CM

## 2020-12-05 DIAGNOSIS — I619 Nontraumatic intracerebral hemorrhage, unspecified: Secondary | ICD-10-CM

## 2020-12-05 LAB — CBC (HEMOGRAM)
Hematocrit: 45 % (ref 38.0–50.0)
Hemoglobin: 14.7 g/dL (ref 13.0–18.0)
MCH: 33.3 pg (ref 27.3–33.6)
MCHC: 33 g/dL (ref 32.2–36.5)
MCV: 101 fL — ABNORMAL HIGH (ref 81–98)
Platelet Count: 252 10*3/uL (ref 150–400)
RBC: 4.41 10*6/uL (ref 4.40–5.60)
RDW-CV: 12.7 % (ref 11.6–14.4)
WBC: 6.49 10*3/uL (ref 4.3–10.0)

## 2020-12-05 LAB — WOUND C/S W/GRAM (ANAEROBIC): Gram Smear: NONE SEEN

## 2020-12-05 LAB — BASIC METABOLIC PANEL
Anion Gap: 9 (ref 4–12)
Calcium: 9.4 mg/dL (ref 8.9–10.2)
Carbon Dioxide, Total: 31 meq/L (ref 22–32)
Chloride: 100 meq/L (ref 98–108)
Creatinine: 0.88 mg/dL (ref 0.51–1.18)
Glucose: 93 mg/dL (ref 62–125)
Potassium: 3.7 meq/L (ref 3.6–5.2)
Sodium: 140 meq/L (ref 135–145)
Urea Nitrogen: 18 mg/dL (ref 8–21)
eGFR by CKD-EPI: >60 mL/min/{1.73_m2} (ref >59)

## 2020-12-05 LAB — TISSUE C/S W/GRAM: Gram Smear: NONE SEEN

## 2020-12-05 LAB — VANCOMYCIN, TROUGH LEVEL: Vancomycin, Trough Level: 14.6 ug/mL (ref 5.0–20.0)

## 2020-12-05 MED ORDER — TETANUS-DIPHTHERIA TOXOIDS TD 5-2 LF/0.5ML IM SUSP
0.5000 mL | Freq: Once | INTRAMUSCULAR | Status: AC
Start: 2020-12-05 — End: 2020-12-06
  Administered 2020-12-06: 0.5 mL via INTRAMUSCULAR
  Filled 2020-12-05: qty 0.5

## 2020-12-05 MED ORDER — GADOTERIDOL 279.3 MG/ML IV SOLN
20.0000 mL | Freq: Once | INTRAVENOUS | Status: AC | PRN
Start: 2020-12-05 — End: 2020-12-05
  Administered 2020-12-05: 10 mmol via INTRAVENOUS

## 2020-12-05 NOTE — Progress Notes (Signed)
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      SUBJECTIVE   INTERVAL HISTORY:  - Feeling well today  - Asked when wound closure may occur    SCHEDULED MEDICATIONS:   amLODIPine, 5 mg, BID  .  atorvastatin, 10 mg, Daily  .  DULoxetine, 60 mg, BID  .  heparin, 5,000 units, q8h SCH  .  lidocaine, 1 Mason, Daily  .  lisinopril, 20 mg, q12h  .  [Held By Provider] naltrexone, 380 mg, q21 days  .  pregabalin, 300 mg, BID  .  primidone, 50 mg, BID  .  vancomycin, 1.5 g, q12h    INFUSED MEDICATIONS:  dextrose 5% and sodium chloride 0.45%, 100 mL/hr, Continuous, Last Rate: 100 mL/hr (12/03/20 3664)  .  lactated ringers, 10-25 mL/hr, Continuous, Last Rate: 10 mL/hr (12/03/20 1613)  .  lactated ringers, 50 mL/hr, Continuous, Last Rate: 50 mL/hr (12/03/20 1849)     PRN MEDICATIONS:  .  acetaminophen, 650 mg, q4h PRN  .  diclofenac, 4 g, QID PRN  .  morphine, 2-4 mg, q6h PRN  .  ondansetron, 4 mg, q8h PRN  .  ondansetron, 4 mg, q8h PRN  .  oxyCODONE, 2.5-5 mg, q4h PRN  .  senna, 17.2 mg, BID PRN       OBJECTIVE     Vitals (Most recent in last 24 hrs)  {Vanishing Tip  Summary  Graph  Flowsheet  :999}   T: 37.2 C (12/05/20 1200)  BP: (!) 143/90 (12/05/20 1200)  HR: 73 (12/05/20 1200)  RR: 18 (12/05/20 1200)  SpO2: 93 % (12/05/20 1200) Room air  T range: Temp  Min: 36.3 C  Max: 37.2 C  Admit weight: (!) 108.9 kg (240 lb) (12/02/20 1500)  Last weight: (!) 108.9 kg (240 lb) (12/02/20 1500)     I&Os:     Intake/Output Summary  (Last 24 hours) at 12/05/2020 1432  Last data filed at 12/05/2020 0900  Intake 700 ml   Output 1400 ml   Net -700 ml     Physical Exam  Cardiovascular:      Rate and Rhythm: Normal rate and regular rhythm.   Pulmonary:      Effort: Pulmonary effort is normal.      Breath sounds: Normal breath sounds.   Abdominal:      Palpations: Abdomen is soft.      Tenderness: There is no abdominal tenderness.   Musculoskeletal:      Comments: R foot with bandage in place, erythema on lower leg above bandage, wound vac in place.   Neurological:      Mental Status: He is alert.   Psychiatric:         Attention and Perception: Attention normal.         Mood and Affect: Mood normal.         Speech: Speech normal.         Cognition and Memory: Memory is impaired.  Comments: Intermittently does not recall that he needs to NWB for RLE after being informed by our team and PT       Labs (last 24 hours):   Chemistries  CBC  LFT  Gases, other   140 100 18 93   14.7   AST: - ALT: -  -/-/-/-  -/-/-/-   3.7 31 0.88   6.49 >< 252  AP: - T bili: -  Lact (a): - Lact (v): -   eGFR: >60 Ca: 9.4   45   Prot: - Alb: -  Trop I: - D-dimer: -   Mg: - PO4: -  ANC: -     BNP: - Anti-Xa: -     ALC: -    INR: -      MICRO  Deep tissue culture 12/03/20:   - Gram smear: 3+ Polymorphonuclear cells   - 2+ Staphylococcus aureus, coagulase positive  - Sensitivity: Oxacillin (representing methicillin) resistance indicates resistance to cephalosporins, beta lactamase inhibitors, imipenem and all other beta lactams.        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     #Right foot abscess  #Right foot cellulitis  LRINEC score 4(CRP >150 mg/L). Afebrile and HDS. MRI of right footdeclined by  patient due to concern of cost. Now s/pwound irrigation,CTX and vancomycin, and s/p surgical debridement on 5/3 by podiatry. Deep tissue culture with 2+ MRSA. Per podiatry, he should continue on IV antibiotics with plans for wound vac and delayed closure. Per query of [facility], last Tdap 11/30/2015.  -Continue IV vancomycin (5/3 - current)  -Discontinued IV ceftriaxone (5/3-5/4)  -f/u blood cultures, NGTD x 3 days  -f/u wound cultures (as above)  -pain control with PO analgesicas below and low dose oxycodone, IV morphine for breakthrough pain   -Td booster    #Chronic pain   Followed by pain clinic, last seen 11/22/20. No changes in medication regimen.   -Continue pregabalin 300 mg BID  -Continue duloxetine 60 mg BID  -Continue acetaminophen up to 2 gm per day  -Continue diclofenac gel    #AUD   11/13/20: 380 mg Naltrexone XR administered to L dorso-gluteal. Prefers dorso-gluteal injections to right side, okay with ventro-gluteal or dorso-gluteal to left side. Reports last drank about 8 months ago.   -Next injection due5/06/2021    #Hypertension  #Hyperlipidemia   Normotensive in ED. No cardiac symptoms reported. Stable on current medication regimen.   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

## 2020-12-05 NOTE — Nursing Note (Signed)
70 year old male with history ofhypertension,hyperlipidemia,AUDon naltrexone,and chronic pain who presents R foot abscess,  admitted s/p R foot plantar I&D.   AXOX3. CMS intact. Pt was asked about pain med. Per pt he wants to go without pain medication for night. Hence pain med was not given in this shift. Pt slept throughout the night. Pt not complaint with instruction. While taking nursing handoff, this RN noticed that pt had disconnected the wound vac and was washing face on sink. When informed that pt is not suppose to remove it he stated " doctor said it is fine". Pt later connected to the wound vac. Pt refused bed alarm and fall mat. Dressing has some old drainage.

## 2020-12-05 NOTE — Progress Notes (Signed)
Progress Note     Ronnie Mason") - DOB: 1951-05-28 70 year old male)  Gender Identity: Male  Preferred Pronouns: he/him/his  Admit Date: 12/02/2020  Code Status: Full Code       CHIEF CONCERN / IDENTIFICATION:  Ronnie Mason is a 70 year old male with s/p I & D right foot     SUBJECTIVE     INTERVAL HISTORY:  Patient was seen at bedside status post I&D day 2 right.  He notes of significant pain when VAC wound dressing was applied.  Currently pain is manageable.  He denies of fever and chills chest pain / shortness breath.  I was told that he was resistant to MRI but today he agreed to getting an MRI.    SCHEDULED MEDICATIONS:   amLODIPine, 5 mg, BID  .  atorvastatin, 10 mg, Daily  .  DULoxetine, 60 mg, BID  .  heparin, 5,000 units, q8h SCH  .  lidocaine, 1 patch, Daily  .  lisinopril, 20 mg, q12h  .  [Held By Provider] naltrexone, 380 mg, q21 days  .  pregabalin, 300 mg, BID  .  primidone, 50 mg, BID  .  tetanus-diphtheria toxoids Td vaccine, 0.5 mL, Once  .  vancomycin, 1.5 g, q12h    INFUSED MEDICATIONS:  dextrose 5% and sodium chloride 0.45%, 100 mL/hr, Continuous, Last Rate: 100 mL/hr (12/03/20 9604)  .  lactated ringers, 10-25 mL/hr, Continuous, Last Rate: 10 mL/hr (12/03/20 1613)  .  lactated ringers, 50 mL/hr, Continuous, Last Rate: 50 mL/hr (12/03/20 1849)     PRN MEDICATIONS:  .  acetaminophen, 650 mg, q4h PRN  .  diclofenac, 4 g, QID PRN  .  morphine, 2-4 mg, q6h PRN  .  ondansetron, 4 mg, q8h PRN  .  ondansetron, 4 mg, q8h PRN  .  oxyCODONE, 2.5-5 mg, q4h PRN  .  senna, 17.2 mg, BID PRN       OBJECTIVE     Vitals (Most recent in last 24 hrs)     T: 37.3 C (12/05/20 1602)  BP: (!) 109/54 (12/05/20 1602)  HR: 74 (12/05/20 1602)  RR: 18 (12/05/20 1602)  SpO2: 93 % (12/05/20 1602) Room air  T range: Temp  Min: 36.3 C  Max: 37.3 C  Admit weight: (!) 108.9 kg (240 lb) (12/02/20 1500)  Last weight: (!) 108.9 kg (240 lb) (12/02/20 1500)       I&Os:     Intake/Output Summary  (Last 24 hours) at 12/05/2020 1822  Last data filed at 12/05/2020 1600  Intake 500 ml   Output 1200 ml   Net -700 ml       Physical Exam  Toes are pink.  The wound VAC is intact.    Labs (last 24 hours):   Chemistries  CBC  LFT  Gases, other   140 100 18 93   14.7   AST: - ALT: -  -/-/-/-  -/-/-/-   3.7 31 0.88   6.49 >< 252  AP: - T bili: -  Lact (a): - Lact (v): -   eGFR: >60 Ca: 9.4   45   Prot: - Alb: -  Trop I: - D-dimer: -   Mg: - PO4: -  ANC: -     BNP: - Anti-Xa: -     ALC: -    INR: -        Culture Abnormal  2+   Staphylococcus aureus, coagulase positive   :   :  Oxacillin (representing methicillin) resistance indicates resistance to cephalosporins, beta lactamase inhibitors, imipenem and all other beta lactams.   - For inpatients, isolate using contact precautions per institutional policy. Contact Infection Control if you have any questions.              ASSESSMENT/PLAN      Status post a 2 I&D of right foot and day 1 of wound VAC.  Currently on vancomycin which he will continue.  Will obtain MRI.  Continue wound VAC for couple more days and consider delayed wound closure on Monday.  I will come in early Monday morning to check the wound before adding the surgery.

## 2020-12-05 NOTE — Nursing Note (Signed)
Illness Severity  Stable      Patient Summary  70 year old male with history ofhypertension,hyperlipidemia,AUDon naltrexone,and chronic pain who presents R foot abscess,  admitted s/p R foot plantar I&D on 5/3.     Hx: HTN    A&O x4. Foot wound tested POS for MRSA-on contact precautions. Gave IV Vanco. Pain controlled well with Oxy and APAP. Possible surgery in next two days to close wound-waiting to hear from podiatry. Possible MRI tomorrow on foot.      Action Items  -IV Vanco  -Wound vac at 75   -Per podiatry and [facility], NWB to right foot  pt is aware  -Oxycodone and APAP PRN  -Can be non compliant at times  -Vanco trough due on 5/5 @2200         Situational Awareness  Lives home  with family   Wife came to visit

## 2020-12-05 NOTE — Progress Notes (Signed)
Physical Therapy  Physical Therapy Treatment    Patient Name: Ronnie Mason  MRN: [id number]  Today's Date: 12/05/2020      General Visit Information  PT Last Visit  PT Received On: 12/05/20  Response to Previous Treatment: Patient with no complaints from previous session.    General  Documentation Type: Progress  Treatment Start Time: 1150  Treatment End Time: 1218  Treatment Duration (min): 28 Mins  Family/Caregiver Present: Yes (spouse)    Subjective  Subjective  Patient Report/Self-Assessment: Approached pt in bed, states he's comfortable and declining any OOB activity. Has questions regarding his post-op tx plan. Spouse present at bedside.  Patient Stated Goal: To return home when ready.    Patient Active Problem List   Diagnosis   . Syncope   . Hypotension   . Carotid Sinus Hypersensitivity   . URI (upper respiratory infection)   . Cervicalgia   . Hyperlipidemia   . Chronic pain   . Bee sting allergy   . Numbness and tingling of leg   . Chronic back pain   . B12 deficiency   . Chronic renal insufficiency, stage III (moderate) (HCC)   . Back pain, lumbosacral   . Lumbar radiculopathy, chronic   . Neck pain   . Fall   . Tremor   . Sensory neuropathy   . 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 myelopathy or radiculopathy   . Idiopathic peripheral neuropathy   . 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)   . History of alcohol dependence (HCC)   . Cerebral ventriculomegaly   . Cerebellar hypoplasia (HCC)   . Essential hypertension   . Essential tremor   . Hx of spontan intraparenchymal intracran bleed assoc with hypertension   . Alcohol abuse   . Intraparenchymal hemorrhage of brain (HCC)   . 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 Balance problem   . Chronic pain of both shoulders   . Vitamin D deficiency   . Difficulty in walking, not elsewhere classified   . Impaired mobility and ADLs   . 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(HCC)   . Primary  osteoarthritis of right hip   . Mild vascular neurocognitive disorder (HCC)   . Right lumbar radiculitis   . Greater trochanteric pain syndrome of right lower extremity   . Complex tear of medial meniscus of left knee as current injury   . Primary osteoarthritis of left knee   . Acute left-sided low back pain without sciatica   . Chronic left-sided low back pain without sciatica   . Enuresis   . Closed fracture of one rib of right side   . MRSA (methicillin resistant staph aureus) culture positive   . Myofascial pain   . Macrocytosis without anaemia   . Abscess of foot   . Cellulitis and abscess of foot   . Abscess         Pain  Pain Assessment  Pain Assessment: Pain Scale  Pain Score: Patient sleeping  Pain Descriptor Scale: Moderate  Pain Intensity: Activity  Pain Location: foot incision    Cognition  Cognition  Overall Cognitive Status: Impaired  Arousal/Alertness: Appropriate responses to stimuli  Orientation Level: Oriented X4  Safety Judgment: Decreased awareness of need for safety  Deficits: Decreased awareness of deficits  Memory: Decreased recall of precautions  Cognition Comments: Not able to observe precautions  Bed Mobility  Bed Mobility  Other (Details): Pt declined OOB activity  today.  Transfers  Transfers  Sit<>Stand: Supervision / Stand by assistance;Assistive device used  Sit<>Stand Assistive Device Details: Front wheeled walker;Axillary crutches  Wheelchair Assessment     Ambulation  Ambulation  Distance: 20  Assistance Needs: Supervision / Stand by assistance (intermittent CGA d/t pt being impulsive)  Assistive Device Used: Front wheeled walker;Axillary crutches  Gait quality/descriptors: Other (Comment) (non-compliant with NWB precautions, demo PWB thru R heel)  Stairs       Treatment  Therapeutic Exercise     Therapeutic Activity  Therapeutic Activity  Therapeutic Activity Time Entry: 28  Therapeutic Activity 1: discussion at length re POC, PT role, and recommendations with pt and  spouse  Therapeutic Activity 2: edu/ reinforcement on NWB precautions  Therapeutic Activity 3: discussion on amb bed <> toilet with FWW vs crutches while adhering to precautions; PT demo with FWW, not recommending crutches as pt states he will not be able to keep his R heel off the ground. pt agreeable to use FWW for increased stability while NWB.  Therapeutic Activity 4: PT offered to amb with pt in room to practice, but pt declined.  Neuromuscular Re-Education     Gait Training  Wheelchair Management     Orthotic Management     Other Activities           PT Assessment Results: Decreased strength;Decreased endurance;Impaired balance;Decreased mobility  Impact on Function and Participation: Pt received in bed with spouse at bedside. Today's session spent discussing at length pt's POC, PT recommendations/ goals, and importance of NWB precautions while awaiting OR. Reinforced current NWB precautions and repeat edu on keeping R foot off ground while amb. Currently pt is only amb in room bed <> toilet although infrequent, will require SBA as pt can be impulsive and a fall risk. Pt admits he will WB in his heel if amb with crutches, says he is unable to remain NWB with crutches d/t instability. PT demonstrated amb with FWW while NWB R LE, and recommending pt use walker vs crutches at this time for increased stability. Pt agreeable to use walker by end of session. Pt declined OOB activity when PT offered practicing amb. Current barrier is pt non-compliance with precautions. IP PT will continue to follow during hospitalization.  Prognosis: Good       Treatment/Interventions: Therapeutic exercise;Therapeutic functional activities;Gait/Stairs training;Patient/family training;Equipment eval/education  PT Plan: Skilled PT  PT Dosage: 5-6x/week  PT Discharge Recommendations: Follow up with PT at next level of care (to be determined by primary team)        Equipment Recommended: Front wheeled walker;Axillary  crutches      Goals  STG  Goal: Transfer with AD;Independent  Estimated Completion Date: 12/06/20  Goal Status: Progressing  STG  Goal: Amb with AD;Independent  Estimated Completion Date: 12/06/20  Goal Status: Progressing  STG  Goal: Stairs;Set up or clean up assistance (13 stairs with 2 HR/ 1 HR)  Estimated Completion Date: 12/06/20                                                 Epimenio Foot, PT  12/05/2020

## 2020-12-06 NOTE — Progress Notes (Signed)
Vancomycin - Pharmacy Dosing    Pharmacy has been consulted to manage vancomycin dosing for Ronnie Mason.    Vancomycin indication: Skin/Soft Tissue Infection    Current regimen:  1.5 g IV every 12 hours started on 12/03/2020    Relevant Clinical Data:    Weight: 108 kg    Creatinine (mg/dL)   Date/Time Value   19/50/9326 0552 0.88   12/04/2020 0810 0.87   12/03/2020 0659 0.90       Estimated CrCl: 108 mL/min    Renal replacement therapy: n/a    Vancomycin, Trough Level (ug/mL)   Date/Time Value   12/05/2020 2249 14.6            Assessment/Plan:    Nephrotoxicity risk factor: Obesity    Vancomycin level is therapeutic.  Vancomycin level goal: 10-20 mcg/mL    Vancomycin dose: continue current regimen. (Vanco 1.5 g iv q12h)  Vancomycin level will be drawn on 1000 at 12/09/2020.  Pharmacy will continue to follow clinical progress daily, monitoring for nephrotoxicity and adjusting regimen as necessary.     Ronnie Mason, PharmD

## 2020-12-06 NOTE — Progress Notes (Signed)
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      SUBJECTIVE   INTERVAL HISTORY:  - Feeling well today  - No questions    SCHEDULED MEDICATIONS:   amLODIPine, 5 mg, BID  .  atorvastatin, 10 mg, Daily  .  DULoxetine, 60 mg, BID  .  heparin, 5,000 units, q8h SCH  .  lidocaine, 1 patch, Daily  .  lisinopril, 20 mg, q12h  .  [Held By Provider] naltrexone, 380 mg, q21 days  .  pregabalin, 300 mg, BID  .  primidone, 50 mg, BID  .  tetanus-diphtheria toxoids Td vaccine, 0.5 mL, Once  .  vancomycin, 1.5 g, q12h    INFUSED MEDICATIONS:  dextrose 5% and sodium chloride 0.45%, 100 mL/hr, Continuous, Last Rate: 100 mL/hr (12/03/20 9604)  .  lactated ringers, 10-25 mL/hr, Continuous, Last Rate: 10 mL/hr (12/03/20 1613)  .  lactated ringers, 50 mL/hr, Continuous, Last Rate: 50 mL/hr (12/03/20 1849)     PRN MEDICATIONS:  .  acetaminophen, 650 mg, q4h PRN  .  diclofenac, 4 g, QID PRN  .  morphine, 2-4 mg, q6h PRN  .  ondansetron, 4 mg, q8h PRN  .  ondansetron, 4 mg, q8h PRN  .  oxyCODONE, 2.5-5 mg, q4h PRN  .  senna, 17.2 mg, BID PRN       OBJECTIVE     Vitals (Most recent in last 24 hrs)     T: 36.6 C (12/06/20 1206)  BP: (!) 125/91 (12/06/20 1206)  HR: 82 (12/06/20 1206)  RR: 17 (12/06/20 1206)  SpO2: 93 % (12/06/20 1206) Room air  T range: Temp  Min: 36.1 C  Max: 37.3 C  Admit weight: (!) 108.9 kg (240 lb) (12/02/20 1500)  Last weight: (!) 108 kg (238 lb 1.6 oz) (12/06/20 0431)     I&Os:     Intake/Output Summary (Last 24  hours) at 12/06/2020 1236  Last data filed at 12/06/2020 1017  Intake 960 ml   Output 300 ml   Net 660 ml     Physical Exam  Cardiovascular:      Rate and Rhythm: Normal rate and regular rhythm.   Pulmonary:      Effort: Pulmonary effort is normal.      Breath sounds: Normal breath sounds.   Abdominal:      Palpations: Abdomen is soft.      Tenderness: There is no abdominal tenderness.   Musculoskeletal:      Comments: R foot with bandage in place, wound vac in place.   Neurological:      Mental Status: He is alert.   Psychiatric:         Attention and Perception: Attention normal.         Mood and Affect: Mood normal.         Speech: Speech normal.         Cognition and Memory: Memory is impaired.      Comments: Intermittently does  not recall that he needs to NWB for RLE after being informed by our team and PT       Labs (last 24 hours):   Chemistries  CBC  LFT  Gases, other   - - - -   -   AST: - ALT: -  -/-/-/-  -/-/-/-   - - -   - >< -  AP: - T bili: -  Lact (a): - Lact (v): -   eGFR: - Ca: -   -   Prot: - Alb: -  Trop I: - D-dimer: -   Mg: - PO4: -  ANC: -     BNP: - Anti-Xa: -     ALC: -    INR: -      MICRO  Deep tissue culture 12/03/20:   - Gram smear: 3+ Polymorphonuclear cells   - 2+ Staphylococcus aureus, coagulase positive  - Sensitivity: Oxacillin (representing methicillin) resistance indicates resistance to cephalosporins, beta lactamase inhibitors, imipenem and all other beta lactams.        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     #Right foot abscess  #Right foot cellulitis  LRINEC score 4(CRP >150 mg/L). Afebrile and HDS. MRI of right footpreviously declined by patient due to concern of cost. Now s/pwound irrigation,CTX and  vancomycin, and s/p surgical debridement on 5/3 by podiatry. Deep tissue culture with 2+ MRSA. Per podiatry, he should continue on IV antibiotics with plans for wound vac and delayed closure. Per query of [facility], last Tdap 11/30/2015.  -Dr. Maudie Mercury with podiatry following, appreciate recommendations   -Follow-up MRI of right foot to evaluate for tenosynovitis   -Dr. Maudie Mercury plans to evaluate on Monday morning, 12/03/2020, and potentially take to the OR that day for wound closure.  -Continue IV vancomycin (5/3 - current)  -Discontinued IV ceftriaxone (5/3-5/4)  -f/u blood cultures, NGTD x 4 days  -f/u wound cultures (as above)  -pain control with PO analgesicas below and low dose oxycodone, IV morphine for breakthrough pain   -Td booster today (printed out a copy of combined immunization record, shared with patient, explained indication, and he is amenable to tetanus vaccine today)    #Chronic pain   Followed by pain clinic, last seen 11/22/20. No changes in medication regimen.   -Continue pregabalin 300 mg BID  -Continue duloxetine 60 mg BID  -Continue acetaminophen up to 2 gm per day  -Continue diclofenac gel    #AUD   11/13/20: 380 mg Naltrexone XR administered to L dorso-gluteal. Prefers dorso-gluteal injections to right side, okay with ventro-gluteal or dorso-gluteal to left side. Reports last drank about 8 months ago.   -Next injection due5/06/2021    #Hypertension  #Hyperlipidemia   Normotensive in ED. No cardiac symptoms reported. Stable on current medication regimen.   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

## 2020-12-06 NOTE — Nursing Note (Signed)
Illness Severity  Stable      Patient Summary  70 year old male with history of hypertension, hyperlipidemia, AUD on naltrexone, and chronic pain who presents R foot abscess,  admitted s/p R foot plantar I&D on 5/3.     Hx: HTN    A&O x4. WCRN cleaned wound and applied vac. PLEASE read note in orders about wound care. Gave IV Vanco. Pain controlled well with Oxy and APAP. Possible surgery on foot next Monday evening-podiatry to confirm.       Action Items  -Contact precautions-MRSA in wound bed  -IV Vanco  -Wound Vac at 75  -R foot elevated as much as possible-wound vac needs to stay attached to bed   -Per podiatry and WCN, NWB to right foot  pt is aware  -Oxycodone and APAP PRN  -Can be non compliant and argumentative-use clear and firm boundaries        Situational Awareness  Lives home  with family   Wife came to visit

## 2020-12-06 NOTE — Progress Notes (Signed)
Progress Note     Ronnie Mason Ronnie Mason") - DOB: [birth year] 70 year old male)  Gender Identity: Male  Preferred Pronouns: he/him/his  Admit Date: 12/02/2020  Code Status: Full Code         Progress Note     Ronnie Mason Ronnie Mason") - DOB: 06-04-1951  Admit Date: 12/02/2020 - Length of Stay: 2 day(s)  Code Status: Full Code     CHIEF CONCERN / IDENTIFICATION:  Ronnie Mason is a 70 year old male with history ofhypertension,hyperlipidemia,AUDon naltrexone,and chronic pain who presents R foot abscess, s/p IV antibiotics s/p I&D by podiatry, Dr. Maudie Mercury on 5/3, seen today for [facility] dressing change.      SUBJECTIVE   HISTORY OF PRESENT ILLNESS:   Hx obtained by chart review, very limited interval history from patient, and discussion with RN.  --[name] complains of constant alarming by wound vac machine, is sitting with it in his bed, silencing the alarm.    --his foot is painful.  He continues unable to hold his foot still and requests help from the nurse to steady his leg for dressing change.    Review of Systems   Constitutional: Negative for chills and fever.   Respiratory: Negative for cough and shortness of breath.    Gastrointestinal: Negative for diarrhea, nausea and vomiting.   Skin: Positive for wound.   Neurological:  Peripheral neuropathy   ALLERGIES:  Adhesives, Bee venom, Gabapentin, and Methocarbamol     SCHEDULED MEDICATIONS:   amLODIPine, 5 mg, BID  .  atorvastatin, 10 mg, Daily  .  DULoxetine, 60 mg, BID  .  heparin, 5,000 units, q8h SCH  .  lidocaine, 1 patch, Daily  .  lisinopril, 20 mg, q12h  .  [Held By Provider] naltrexone, 380 mg, q21 days  .  pregabalin, 300 mg, BID  .  primidone, 50 mg, BID  .  tetanus-diphtheria toxoids Td vaccine, 0.5 mL, Once  .  vancomycin, 1.5 g, q12h    INFUSED MEDICATIONS:  dextrose 5% and sodium chloride 0.45%, 100 mL/hr, Continuous, Last Rate: 100 mL/hr (12/03/20 4098)  .  lactated ringers, 10-25 mL/hr, Continuous, Last Rate: 10 mL/hr (12/03/20  1613)  .  lactated ringers, 50 mL/hr, Continuous, Last Rate: 50 mL/hr (12/03/20 1849)     PRN MEDICATIONS:  .  acetaminophen, 650 mg, q4h PRN  .  diclofenac, 4 g, QID PRN  .  morphine, 2-4 mg, q6h PRN  .  ondansetron, 4 mg, q8h PRN  .  ondansetron, 4 mg, q8h PRN  .  oxyCODONE, 2.5-5 mg, q4h PRN  .  senna, 17.2 mg, BID PRN       OBJECTIVE     Vitals (Most recent)     T: 36.6 C  BP: (!) 125/91  HR: 82   RR: 17  SpO2: 93 %  T range: Temp  Min: 36.1 C  Max: 37.3 C  Admit weight: (!) 108.9 kg (240 lb) (12/02/20 1500)  Last weight: (!) 108 kg (238 lb 1.6 oz) (12/06/20 0431)     Physical EXAM:  Gen: Alert, irritable man, insisting that his actions (managing wound vac alarm) is approved by doctors, RNs not helping, etc.  Resp:breathing easily on room air.  MSK: involuntary jerking of right foot and lower leg.  Skin   Wound Assessment:      Wound Image     Site Assessment Maceration;Red   Peri-Wound Assessment Maceration   Drainage Description Serous;Yellow   Drainage  Amount Small   Dressing Changed New   Wound Length (cm) 11 cm   Wound Width (cm) 3.5 cm   Wound Surface Area (cm^2) 38.5 cm^2   Wound Depth (cm) 2 cm   Wound Volume (cm^3) 77 cm^3   Non-staged Wound Description Full thickness   Wound Vac Setting/Mode Continuous at 68mHg   Total Pieces of Foam Used Adaptic to wound Base, then 1 piece of black foam and bridge to dorsal foot.   Wound Vac Output (mL) 5 mL     Procedure:  Vac dressing changed to right plantar foot wound,  33sq cm  On approach vac dressing over the wound is not functioning, suction hub dislodged.  Approximately 585m in canister.  See skin exam for wound description.  Adaptic contact layer applied to cover exposed  vessels  Black foam dressing cut to fit and applied to wound bed, secured with occlusive drape,  Bridge placed to dorsal right foot, suction hub applied and attached to 12558m continuous suction via the wound vac.   Difficult application due to patient inability to hold his foot quiet.       Labs (last 24 hours):   Chemistries  CBC  LFT  Gases, other   - - - -   -   AST: - ALT: -  -/-/-/-  -/-/-/-   - - -   - >< -  AP: - T bili: -  Lact (a): - Lact (v): -   eGFR: - Ca: -   -   Prot: - Alb: -  Trop I: - D-dimer: -   Mg: - PO4: -  ANC: -     BNP: - Anti-Xa: -     ALC: -    INR: -      ASSESSMENT/PLAN     Right plantar foot abscess, now a wound deep to muscle, s/p I&D by Dr. KimMaudie Mercury  Wound VAC was place with much difficulty on 5/4, RN reports patient has been out of bed without assistance, pulling on the VAC tubing, fussing with his dressing.  Overnight alarm constant, suction hub was disconnected.    Replaced the VAC dressing this morning.    Due to patient behavior issues, [facility], [facility]am[facility] Med team, and patient:    Instruction:  NO SHOWER till Monday morning if VAC continues intact and functioning over the weekend.  If seal fails or any other problem with function that cannot be addressed, then RN to remove ENTIRE VAC dressing, fill wound Cavity w NS moistened kerlix, cover w ABD/medipore tape and change BID till Monday's planned surgery.   Wound team will  See him if needed, KimMaudie Mercuryy close wound on Monday May 9.  MarForestine NaRNP

## 2020-12-06 NOTE — Nursing Note (Addendum)
70 year old male withhistory ofhypertension,hyperlipidemia,AUDon naltrexone,and chronic pain who presents R foot abscess,  admitted s/p R foot plantar I&D.   AXOX3. CMS intact. Pain well controlled with oxycodone. Wound vac started to beep this early morning. Pt been manipulating wound vac even when this RN told not to do. Pt had MRI last night. Cooperative with care most of the time. Voiding well. Independent in room . Refused bed alarm and fall mat. Pt wants to take Tdap once he discuss with doctor in the morning as per pt he took all his shots during his grandchild birth.

## 2020-12-07 LAB — COVID-19 CORONAVIRUS QUALITATIVE PCR: COVID-19 Coronavirus Qual PCR Result: NOT DETECTED

## 2020-12-07 LAB — BLOOD C/S
Culture: NO GROWTH
Culture: NO GROWTH

## 2020-12-07 MED ORDER — DOXYCYCLINE HYCLATE 100 MG OR CAPS
100.0000 mg | ORAL_CAPSULE | Freq: Two times a day (BID) | ORAL | Status: DC
Start: 2020-12-07 — End: 2020-12-11
  Administered 2020-12-07 – 2020-12-10 (×7): 100 mg via ORAL
  Filled 2020-12-07 (×9): qty 1

## 2020-12-07 MED ORDER — ENOXAPARIN SODIUM 40 MG/0.4ML IJ SOSY
40.0000 mg | PREFILLED_SYRINGE | Freq: Every day | INTRAMUSCULAR | Status: DC
Start: 2020-12-07 — End: 2020-12-11
  Administered 2020-12-07 – 2020-12-10 (×4): 40 mg via SUBCUTANEOUS
  Filled 2020-12-07 (×4): qty 0.4

## 2020-12-07 NOTE — Progress Notes (Signed)
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      SUBJECTIVE   INTERVAL HISTORY:  - Slept well overnight and did not remove wound vac  - Feeling well today  - No questions    SCHEDULED MEDICATIONS:   amLODIPine, 5 mg, BID  .  atorvastatin, 10 mg, Daily  .  doxycycline hyclate, 100 mg, BID  .  DULoxetine, 60 mg, BID  .  heparin, 5,000 units, q8h SCH  .  lidocaine, 1 patch, Daily  .  lisinopril, 20 mg, q12h  .  [Held By Provider] naltrexone, 380 mg, q21 days  .  pregabalin, 300 mg, BID  .  primidone, 50 mg, BID    INFUSED MEDICATIONS:       PRN MEDICATIONS:  .  acetaminophen, 650 mg, q4h PRN  .  diclofenac, 4 g, QID PRN  .  morphine, 2-4 mg, q6h PRN  .  ondansetron, 4 mg, q8h PRN  .  ondansetron, 4 mg, q8h PRN  .  oxyCODONE, 2.5-5 mg, q4h PRN  .  senna, 17.2 mg, BID PRN       OBJECTIVE     Vitals (Most recent in last 24 hrs)     T: 36.3 C (12/07/20 0948)  BP: 139/78 (12/07/20 0948)  HR: 65 (12/07/20 0948)  RR: 18 (12/07/20 0948)  SpO2: 96 % (12/07/20 0948) Room air  T range: Temp  Min: 36.3 C  Max: 37.1 C  Admit weight: (!) 108.9 kg (240 lb) (12/02/20 1500)  Last weight: (!) 108 kg (238 lb 1.6 oz) (12/06/20 0431)     I&Os:     Intake/Output Summary (Last 24 hours) at 12/07/2020 1055  Last data filed at 12/07/2020 0900  Intake 2000 ml   Output 1505 ml   Net 495 ml     Physical Exam  Constitutional:       Appearance: He is not ill-appearing.      Comments: Lying comfortably in bed.    Cardiovascular:      Rate and Rhythm: Normal rate and regular rhythm.   Pulmonary:      Effort: Pulmonary effort is normal.      Breath sounds: Normal breath sounds.   Abdominal:      Palpations: Abdomen is soft.      Tenderness: There is no abdominal tenderness.   Musculoskeletal:      Comments: R foot with bandage in place, wound vac in place.   Neurological:      Mental Status: He is alert.   Psychiatric:         Attention and Perception: Attention normal.         Mood and Affect: Mood normal.         Speech: Speech normal.         Cognition and Memory: Memory is impaired.      Comments: Intermittently does not recall that he needs to NWB for  RLE after being informed by our team and PT       Labs (last 24 hours):   Chemistries  CBC  LFT  Gases, other   - - - -   -   AST: - ALT: -  -/-/-/-  -/-/-/-   - - -   - >< -  AP: - T bili: -  Lact (a): - Lact (v): -   eGFR: - Ca: -   -   Prot: - Alb: -  Trop I: - D-dimer: -   Mg: - PO4: -  ANC: -     BNP: - Anti-Xa: -     ALC: -    INR: -      MICRO  Deep tissue culture 12/03/20:   - Gram smear: 3+ Polymorphonuclear cells   - 2+ Staphylococcus aureus, coagulase positive  - Sensitivity: Oxacillin (representing methicillin) resistance indicates resistance to cephalosporins, beta lactamase inhibitors, imipenem and all other beta lactams.     IMAGING  Imaging Results:  MRI Foot w wo Contrast Right  Narrative: EXAMINATION:  MRI FOOT WO/W CONTRAST RIGHT    CLINICAL INDICATION:  R foot abcess s/p I&D on 5/3 on IV abx, evaluate for infectious tenosynovitis of flexor tendon  Cellulitis and abscess of foot    TECHNIQUE:  MSK MR 54 Tumor - Osteomyelitis.  No contraindications to MRI scanning were identified by the departmental screening protocol. Multi-planar, multi-sequence MR imaging was performed before and after the intravenous administration of gadolinium-based contrast.    CONTRAST:    GADOTERIDOL 279.3 MG/ML IV SOLN,10 mmol Intravenous,12/05/2020 2212    COMPARISON:  MRI right  ankle dated 09/21/2018.    FINDINGS:  Moderately motion degraded examination.    *  Postsurgical changes of recent incision and drainage for a reported foot abscess including an obliquely orientated soft tissue defect extending across the distal plantar midfoot and forefoot, roughly extending along a superficial line subjacent to the 1st Ronnie Mason joint to the 4th metatarsal head.     *  There is enhancement and T2/STIR hyperintensity about the soft tissue defect that extends to involve the superficial muscular compartment. Small volume fluid within the flexor hallucis longus tendon sheath with a normal tendon signal and caliber (1201/28).     *  No visualized measurable fluid collection to suggest residual abscess.    *  1st MTP arthrodesis. Scattered midfoot OA.  Impression: Limited examination secondary to moderate motion degradation.    1.  Postsurgical changes of recent plantar foot wound incision and drainage. Minimal fluid within the flexor hallucis longus tendon sheath, likely related to recent surgical debridement. Normal tendon caliber and signal.    2.  No evident residual abscess.    I have personally reviewed the images and agree with the report (or as edited).         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     #Right foot abscess  #Right foot cellulitis  LRINEC score 4(CRP >150 mg/L). Afebrile and HDS. MRI of right footpreviously declined by patient due to concern of cost. Now s/pwound irrigation,CTX and vancomycin, and s/p surgical debridement on 5/3 by podiatry.  Deep tissue culture with 2+ MRSA. Per podiatry, plan he for wound vac and delayed closure, potentially on  5/9.   -Start doxycyline 100 mg twice daily  -Discontinue IV vancomycin (5/3 - 5/7)  -Dr. Maudie Mercury with podiatry following, appreciate recommendations   -MRI of right foot negative for tenosynovitis   -Dr. Maudie Mercury plans to evaluate on Monday morning, 12/09/2020, and potentially take to the OR that day for wound closure.  -Wound care evaluation 12/06/20   -NO SHOWER till Monday morning if VAC continues intact and functioning over the weekend.     -If seal fails or any other problem with function that cannot be addressed, then RN to remove ENTIRE VAC dressing, fill wound cavity with NS moistened Kerlix, cover with ABD/medipore tape and change BID until Monday's planned surgery.   -Discontinued IV ceftriaxone (5/3-5/4)  -f/u blood cultures, NGTD x 5 days  -f/u wound cultures (as above)  -Pain control with PO analgesicas below and low dose oxycodone, IV morphine for breakthrough pain   -Td booster 5/6 (Per query of [facility], last Tdap 11/30/2015, printed out a copy of combined immunization record)    #Chronic pain   Followed by pain clinic, last seen 11/22/20. No changes in medication regimen.   -Continue pregabalin 300 mg BID  -Continue duloxetine 60 mg BID  -Continue acetaminophen up to 2 gm per day  -Continue diclofenac gel    #AUD   11/13/20: 380 mg Naltrexone XR administered to L dorso-gluteal. Prefers dorso-gluteal injections to right side, okay with ventro-gluteal or dorso-gluteal to left side. Reports last drank about 8 months ago.   -Next injection due5/06/2021    #Hypertension  #Hyperlipidemia   Normotensive in ED. No cardiac symptoms reported. Stable on current medication regimen.   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

## 2020-12-07 NOTE — Nursing Note (Signed)
GENERAL PATIENT SHIFT NOTE:    PO Intake: good po intake     GI: multiple BMs today     GU: voiding normally     Oxygen Needs: none     Vital Trends Today: VSS     Activity: independent in room, noncompliant with nonweightbearing status on R foot, does comply with heel only transfers/walking     Pain: pain is well controlled on current regimen      Skin: wound vac dressing is still intact.     PRN Meds Given: pain meds only, see MAR     Patient Concerns: somewhat anxious     Other: plan surgery Monday. IV replaced per pt request. Switched to PO ABX today

## 2020-12-07 NOTE — Nursing Note (Signed)
Patient Summary    A&Ox4. Ambulated to bathroom and voided via urinal. Right foot dressing CDI with wound vac intact with very little output. Oxycodone 5 mg PRN and routine Lyrica given for right foot pain with good effects. VSS. Afebrile. RA. Patient tolerated IV vanco well. No major events happened for this shift. Patient appeared comfortable and slept well last night. CTM.      Illness Severity  Stable

## 2020-12-08 NOTE — Hospital Course (Signed)
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 Pt medically stable at time of discharge on ***.      #Right foot abscess   #Right foot cellulitis   LRINEC score 4 (CRP >150 mg/L). Afebrile and HDS. MRI of right foot previously declined by patient due to concern of cost. Now s/p wound irrigation, CTX and vancomycin, and s/p surgical debridement on 5/3 by podiatry. Deep tissue culture with 2+ MRSA. Per podiatry, plan he for wound vac and delayed closure, potentially on 5/9 with [name].   -Doxycyline 100 mg twice daily (start 5/7-5/17)  -Discontinued IV ceftriaxone (5/3-5/4)  -Discontinue IV vancomycin (5/3 - 5/7)  -pt should be NWB to RLE, would benefit from R knee scooter, advised pt of this    #Chronic pain   Followed by pain clinic, last seen 11/22/20. No changes in medication regimen.   -Continue pregabalin 300 mg BID  -Continue duloxetine 60 mg BID  -Continue acetaminophen up to 2 gm per day  -Continue diclofenac gel     #AUD    11/13/20: 380 mg Naltrexone XR administered to L dorso-gluteal. Prefers dorso-gluteal injections to right side, okay with ventro-gluteal or dorso-gluteal to left side. Reports last drank about 8 months ago.    -Next injection due 12/11/2020     #Hypertension  #Hyperlipidemia   Normotensive in ED. No cardiac symptoms reported. Stable on current medication regimen.   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

## 2020-12-08 NOTE — Progress Notes (Signed)
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      SUBJECTIVE   INTERVAL HISTORY:  - No acute events overnight. Vital signs stable.   - Feels well today. Asking if he can be seen by a neurologist for his neuropathy, preferably during the surgery tomorrow.   - Otherwise, no acute complains or concerns.     SCHEDULED MEDICATIONS:   amLODIPine, 5 mg, BID  .  atorvastatin, 10 mg, Daily  .  doxycycline hyclate, 100 mg, BID  .  DULoxetine, 60 mg, BID  .  enoxaparin, 40 mg, Daily  .  lidocaine, 1 patch, Daily  .  lisinopril, 20 mg, q12h  .  [Held By Provider] naltrexone, 380 mg, q21 days  .  pregabalin, 300 mg, BID  .  primidone, 50 mg, BID    INFUSED MEDICATIONS:       PRN MEDICATIONS:  .  acetaminophen, 650 mg, q4h PRN  .  diclofenac, 4 g, QID PRN  .  morphine, 2-4 mg, q6h PRN  .  ondansetron, 4 mg, q8h PRN  .  ondansetron, 4 mg, q8h PRN  .  oxyCODONE, 2.5-5 mg, q4h PRN  .  senna, 17.2 mg, BID PRN       OBJECTIVE     Vitals (Most recent in last 24 hrs)     T: 36.8 C (12/08/20 0900)  BP: (!) 154/83 (12/08/20 0900)  HR: 78 (12/08/20 0900)  RR: 18 (12/08/20 0900)  SpO2: 94 % (12/08/20 0900) Room air  T range: Temp  Min: 36.4 C  Max: 37.2 C  Admit weight: (!) 108.9 kg (240 lb) (12/02/20 1500)  Last weight: (!) 108 kg (238 lb 1.6 oz) (12/06/20 0431)     I&Os:     Intake/Output Summary (Last 24 hours) at 12/08/2020 1219  Last data filed at 12/08/2020 1000  Intake 1280 ml   Output 250 ml   Net  1030 ml     Physical Exam:   General: sitting up comfortably in bed, eating breakfast  CV: normal rate and regular rhythm, no murmurs/rubs/gallops  Pulmonary: breathing comfortably on room air, lungs clear to auscultation bilaterally  Abdomen: non-distended  MSK: R foot with wound vac in place and covered with clean, dry, intact dressing  Neuro: alert and oriented  Psych: limited historian, affect congruent to mood    Labs (last 24 hours):   Chemistries  CBC  LFT  Gases, other   - - - -   -   AST: - ALT: -  -/-/-/-  -/-/-/-   - - -   - >< -  AP: - T bili: -  Lact (a): - Lact (v): -   eGFR: - Ca: -   -   Prot: - Alb: -  Trop I: - D-dimer: -   Mg: - PO4: -  ANC: -     BNP: - Anti-Xa: -     ALC: -  INR: -         ASSESSMENT/PLAN      Ronnie Mason is a 70 year old male with a history of hypertension, hyperlipidemia, AUD on naltrexone, chronic pain, and 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     #Right foot abscess  #Right foot cellulitis  LRINEC score 4(CRP >150 mg/L). Afebrile and HDS. MRI of right footpreviously declined by patient due to concern of cost. Now s/pwound irrigation,CTX and vancomycin, and s/p surgical debridement on 5/3 by podiatry. Deep tissue culture with 2+ MRSA. Per podiatry, plan he for wound vac and delayed closure, potentially on 5/9.   -Doxycyline 100 mg twice daily (start 5/7)  -Dr. Maudie Mercury with podiatry following, appreciate recommendations   -MRI of right foot negative for tenosynovitis   -Dr. Maudie Mercury plans to evaluate on Monday morning, 12/09/2020, with plan for wound closure around 1700; NPO 0800 5/9    -Discuss non-weight bearing plan with PT   -NO SHOWER until wound closure instructions (after 5/9)   -If seal fails or any other problem with function that cannot be addressed, then RN to remove  ENTIRE VAC dressing, fill wound cavity with NS moistened Kerlix, cover with ABD/medipore tape and change BID until Monday's planned surgery.   -Discontinued IV ceftriaxone (5/3-5/4)  -Discontinue IV vancomycin (5/3 - 5/7)  -f/u blood cultures, NGTD  -f/u wound cultures (as above)  -Pain control with PO analgesicas below and low dose oxycodone, IV morphine for breakthrough pain   -Td booster 5/6 (Per query of [facility], last Tdap 11/30/2015, printed out a copy of combined immunization record)    #Chronic pain   Followed by pain clinic, last seen 11/22/20. No changes in medication regimen.   -Continue pregabalin 300 mg BID  -Continue duloxetine 60 mg BID  -Continue acetaminophen up to 2 gm per day  -Continue diclofenac gel    #AUD   11/13/20: 380 mg Naltrexone XR administered to L dorso-gluteal. Prefers dorso-gluteal injections to right side, okay with ventro-gluteal or dorso-gluteal to left side. Reports last drank about 8 months ago.   -Next injection due5/06/2021    #Hypertension  #Hyperlipidemia   Normotensive in ED. No cardiac symptoms reported. Stable on current medication regimen.   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

## 2020-12-08 NOTE — Nursing Note (Signed)
GENERAL PATIENT SHIFT NOTE:    PO Intake: good po intake of food and fluids     GI: bm within the last day     GU: voiding normally     Oxygen Needs: none     Vital Trends Today: VSS     Activity: transfers self, verbalizes understanding of NWB status on R foot but still does heel weight bearing     Pain: pain is well controlled on current regimen     Skin: no new issues, wound vac still on and working     PRN Meds Given: pain meds only, see MAR     Patient Concerns: ready for surgery tomorrow     Other: plan OR at 1700?

## 2020-12-08 NOTE — Nursing Note (Addendum)
Patient Summary    Independent in room. Voided. Oxycodone 5 PRN, Tylenol, and routine Lyrica given for right foot pain with good effects. Wound vac intact with small drainage. VSS. Afebrile. RA. Patient reported chest pain this AM due to pulling himself up without any cardiac issue related. Make needs known. No major events happened for this shift. Patient slept well last night. CTM.    Illness Severity  Stable

## 2020-12-09 ENCOUNTER — Encounter (HOSPITAL_COMMUNITY)
Admission: EM | Disposition: A | Discharge status: Home / Self Care | Payer: Self-pay | Source: Ambulatory Visit | Attending: Family Medicine

## 2020-12-09 ENCOUNTER — Inpatient Hospital Stay (HOSPITAL_COMMUNITY): Payer: Medicare PPO | Admitting: Anesthesiology

## 2020-12-09 ENCOUNTER — Encounter (INDEPENDENT_AMBULATORY_CARE_PROVIDER_SITE_OTHER): Payer: Self-pay | Admitting: Podiatrist

## 2020-12-09 ENCOUNTER — Encounter (HOSPITAL_COMMUNITY): Payer: Self-pay | Admitting: Anesthesiology

## 2020-12-09 DIAGNOSIS — L02619 Cutaneous abscess of unspecified foot: Secondary | ICD-10-CM

## 2020-12-09 DIAGNOSIS — L02611 Cutaneous abscess of right foot: Secondary | ICD-10-CM

## 2020-12-09 SURGERY — CLOSURE, WOUND, FOOT, DELAYED, PRIMARY
Anesthesia: General | Site: Foot | Laterality: Right | Wound class: Class IV/ Dirty or Infected

## 2020-12-09 MED ORDER — LIDOCAINE HCL 2 % IJ SOLN
INTRAMUSCULAR | Status: DC | PRN
Start: 2020-12-09 — End: 2020-12-09
  Administered 2020-12-09: 8 mL via INTRAMUSCULAR

## 2020-12-09 MED ORDER — MEPERIDINE HCL 25 MG/ML IJ SOLN
10.0000 mg | INTRAMUSCULAR | Status: DC | PRN
Start: 2020-12-09 — End: 2020-12-09

## 2020-12-09 MED ORDER — PROPOFOL 10 MG/ML IV EMUL WRAPPER (OSM ONLY)
INTRAVENOUS | Status: DC | PRN
Start: 2020-12-09 — End: 2020-12-09
  Administered 2020-12-09: 50 mg via INTRAVENOUS

## 2020-12-09 MED ORDER — HYDROMORPHONE HCL 1 MG/ML IJ SOLN
INTRAMUSCULAR | Status: AC
Start: 2020-12-09 — End: 2020-12-09
  Administered 2020-12-09: 0.2 mg via INTRAVENOUS
  Filled 2020-12-09: qty 1

## 2020-12-09 MED ORDER — FENTANYL CITRATE (PF) 100 MCG/2ML IJ SOLN
12.5000 ug | INTRAMUSCULAR | Status: DC | PRN
Start: 2020-12-09 — End: 2020-12-09

## 2020-12-09 MED ORDER — LACTATED RINGERS IV SOLN
100.0000 mL/h | INTRAVENOUS | Status: DC
Start: 2020-12-09 — End: 2020-12-11

## 2020-12-09 MED ORDER — NALOXONE HCL 0.4 MG/ML IJ SOLN
0.0400 mg | INTRAMUSCULAR | Status: DC | PRN
Start: 2020-12-09 — End: 2020-12-09

## 2020-12-09 MED ORDER — FENTANYL CITRATE (PF) 100 MCG/2ML IJ SOLN
INTRAMUSCULAR | Status: AC
Start: 2020-12-09 — End: 2020-12-09
  Administered 2020-12-09: 50 ug via INTRAVENOUS
  Filled 2020-12-09: qty 2

## 2020-12-09 MED ORDER — LACTATED RINGERS BOLUS
500.0000 mL | Freq: Once | INTRAVENOUS | Status: DC | PRN
Start: 2020-12-09 — End: 2020-12-09

## 2020-12-09 MED ORDER — ONDANSETRON HCL 4 MG/2ML IJ SOLN
INTRAMUSCULAR | Status: DC | PRN
Start: 2020-12-09 — End: 2020-12-09
  Administered 2020-12-09: 4 mg via INTRAVENOUS

## 2020-12-09 MED ORDER — ALBUTEROL SULFATE (2.5 MG/3ML) 0.083% IN NEBU
2.5000 mg | INHALATION_SOLUTION | Freq: Once | RESPIRATORY_TRACT | Status: DC
Start: 2020-12-09 — End: 2020-12-09

## 2020-12-09 MED ORDER — ONDANSETRON HCL 4 MG/2ML IJ SOLN
4.0000 mg | INTRAMUSCULAR | Status: DC | PRN
Start: 2020-12-09 — End: 2020-12-09

## 2020-12-09 MED ORDER — PHENYLEPHRINE HCL-NACL 1-0.9 MG/10ML-% IV SOSY
PREFILLED_SYRINGE | INTRAVENOUS | Status: DC | PRN
Start: 2020-12-09 — End: 2020-12-09
  Administered 2020-12-09 (×5): 100 ug via INTRAVENOUS

## 2020-12-09 MED ORDER — MIDAZOLAM HCL (PF) 1 MG/ML IJ SOLN WRAPPER (ANESTHESIA OSM ONLY)
INTRAMUSCULAR | Status: DC | PRN
Start: 2020-12-09 — End: 2020-12-09
  Administered 2020-12-09: 2 mg via INTRAVENOUS

## 2020-12-09 MED ORDER — METOCLOPRAMIDE HCL 5 MG/ML IJ SOLN
5.0000 mg | Freq: Once | INTRAMUSCULAR | Status: DC | PRN
Start: 2020-12-09 — End: 2020-12-09

## 2020-12-09 MED ORDER — OXYCODONE HCL 5 MG OR TABS
5.0000 mg | ORAL_TABLET | ORAL | Status: DC | PRN
Start: 2020-12-09 — End: 2020-12-11
  Administered 2020-12-09 – 2020-12-10 (×5): 10 mg via ORAL
  Filled 2020-12-09 (×5): qty 2

## 2020-12-09 MED ORDER — LACTATED RINGERS IV SOLN
INTRAVENOUS | Status: DC | PRN
Start: 2020-12-09 — End: 2020-12-09

## 2020-12-09 MED ORDER — HYDROMORPHONE HCL 1 MG/ML IJ SOLN
0.2000 mg | INTRAMUSCULAR | Status: DC | PRN
Start: 2020-12-09 — End: 2020-12-09
  Administered 2020-12-09: 0.4 mg via INTRAVENOUS

## 2020-12-09 MED ORDER — SODIUM CHLORIDE 0.9% IV BOLUS
500.0000 mL | Freq: Once | INTRAVENOUS | Status: DC | PRN
Start: 2020-12-09 — End: 2020-12-09

## 2020-12-09 MED ORDER — EPHEDRINE SULFATE (PRESSORS) 50 MG/ML IV SOLN
INTRAVENOUS | Status: DC | PRN
Start: 2020-12-09 — End: 2020-12-09
  Administered 2020-12-09 (×2): 10 mg via INTRAVENOUS
  Administered 2020-12-09: 20 mg via INTRAVENOUS
  Administered 2020-12-09: 10 mg via INTRAVENOUS

## 2020-12-09 MED ORDER — FENTANYL CITRATE (PF) 50 MCG/ML IJ SOLN WRAPPER (ANESTHESIA OSM ONLY)
INTRAMUSCULAR | Status: DC | PRN
Start: 2020-12-09 — End: 2020-12-09
  Administered 2020-12-09: 50 ug via INTRAVENOUS

## 2020-12-09 MED ORDER — SODIUM CHLORIDE 0.9 % IV SOLN
75.0000 mL/h | INTRAVENOUS | Status: DC
Start: 2020-12-09 — End: 2020-12-10

## 2020-12-09 MED ORDER — PROPOFOL 10 MG/ML IV EMUL WRAPPER (OSM ONLY)
INTRAVENOUS | Status: DC | PRN
Start: 2020-12-09 — End: 2020-12-09
  Administered 2020-12-09: 150 ug/kg/min via INTRAVENOUS

## 2020-12-09 MED ORDER — LIDOCAINE HCL PF 2% IV/IJ SOSY/SOLN WRAPPER (ANESTHESIA OSM ONLY)
INTRAVENOUS | Status: DC | PRN
Start: 2020-12-09 — End: 2020-12-09
  Administered 2020-12-09 (×2): 60 mg via INTRAVENOUS

## 2020-12-09 SURGICAL SUPPLY — 31 items
APPLICATOR PREP CHLORAPREP 26ML CLR 2% CHG (Prep) ×4 IMPLANT
BANDAGE ELASTIC 4IN  NON-STERILE (Dressing) ×2 IMPLANT
BANDAGE ELASTIC 6IN  NON-STERILE (Dressing) IMPLANT
BANDAGE GAUZE BULKEE II 4.1YDX4.5IN (Dressing) ×2 IMPLANT
BLADE SURG BARD-PARKER SS 15 CONVENTIONAL (Blade) ×6 IMPLANT
DRESSING PETROLATUM ADAPTIC CELLULOSE ACETATE FABRIC 3INX3IN (Dressing) ×2 IMPLANT
ELECTRODE ULTRACLEAN 1IN BLADE EXTEND INSULATE (Cautery) ×2 IMPLANT
GLOVE SURG BIOGEL 7 ULTRATOUCH PF (Glove) ×2 IMPLANT
GLOVE SURG BIOGEL UNDERGLOVE 7 1/2 INDICATOR PF (Glove) ×2 IMPLANT
GOWN AURORA AAMI LEVEL 3 XL BLUE (Gown) ×2 IMPLANT
LINEN PACK SURGICAL (Other) ×2 IMPLANT
NEEDLE HYPODERMIC SAFETY 25GA 1-1/2IN MAGELLAN (Needle) ×2 IMPLANT
PACK PODIATRY (Pack) ×2 IMPLANT
PAD ESURG GRNDING UNIV PREATTACH CORD SPLIT (Other) ×2 IMPLANT
PADDING CAST WEBRIL COTTON 4YDX4IN UNDERCAST NS (Dressing) IMPLANT
PADDING CAST WEBRIL COTTON 4YDX4IN UNDERCAST STERILE (Dressing) ×2 IMPLANT
PREP SOLUTION PVP IODINE 3/4OZ POUCH (Prep) ×2 IMPLANT
SKIN CLOSURE STERI-STRIP 4INX1/2IN (Dressing) IMPLANT
SKIN CLOSURE STERI-STRIP 5INX1IN (Dressing) ×2 IMPLANT
SLEEVE PNEUMATIC KNEE MEDIUM (Other) ×2 IMPLANT
SPLINT SPECIALIST 30INX5IN EXTRA FAST SET (Other) IMPLANT
SUTURE 5-0 VICRYL (Suture) ×2 IMPLANT
SUTURE FIBERTAPE 2 TAPER 7INX2MM BLUE (Suture) IMPLANT
SUTURE POLYSORB 3-0 V-20 30IN UNDYED (Suture) ×2 IMPLANT
SUTURE POLYSORB 5-0 C-13 30IN UNDYED (Suture) ×2 IMPLANT
SUTURE PROLENE 3-0 FS-2 18IN BLUE (Suture) ×2 IMPLANT
SUTURE PROLENE 4-0 FS-2 18IN MONOFILAMENT BLUE (Suture) ×2 IMPLANT
SUTURE SURGIPRO II 2-0 GS-21 30IN BLUE (Suture) ×6 IMPLANT
SUTURE SURGIPRO II 3-0 KV-7 36IN BLUE (Suture) ×2 IMPLANT
SUTURE VICRYL PLUS 3-0 FS-2 27IN UNDYED (Suture) ×2 IMPLANT
SYRINGE BD 10ML CONTROL (Syringe) ×2 IMPLANT

## 2020-12-09 NOTE — Progress Notes (Signed)
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      SUBJECTIVE   INTERVAL HISTORY:  - No acute events overnight. Vital signs stable.   - Feels well today.  - Otherwise, no acute complains or concerns.   - Ordered three coffees for today before being made NPO at 8 AM.    SCHEDULED MEDICATIONS:   amLODIPine, 5 mg, BID  .  atorvastatin, 10 mg, Daily  .  doxycycline hyclate, 100 mg, BID  .  DULoxetine, 60 mg, BID  .  enoxaparin, 40 mg, Daily  .  lidocaine, 1 Mason, Daily  .  lisinopril, 20 mg, q12h  .  [Held By Provider] naltrexone, 380 mg, q21 days  .  pregabalin, 300 mg, BID  .  primidone, 50 mg, BID    INFUSED MEDICATIONS:       PRN MEDICATIONS:  .  acetaminophen, 650 mg, q4h PRN  .  diclofenac, 4 g, QID PRN  .  morphine, 2-4 mg, q6h PRN  .  ondansetron, 4 mg, q8h PRN  .  ondansetron, 4 mg, q8h PRN  .  oxyCODONE, 2.5-5 mg, q4h PRN  .  senna, 17.2 mg, BID PRN       OBJECTIVE     Vitals (Most recent in last 24 hrs)     T: 36.8 C (12/08/20 1700)  BP: (!) 115/49 (12/09/20 0657)  HR: 76 (12/09/20 0657)  RR: 20 (12/09/20 0657)  SpO2: 97 % (12/09/20 0657) Room air  T range: Temp  Min: 36.8 C  Max: 36.8 C  Admit weight: (!) 108.9 kg (240 lb) (12/02/20 1500)  Last weight: (!) 108 kg (238 lb 1.6 oz) (12/06/20 0431)     I&Os:     Intake/Output Summary (Last 24 hours) at 12/09/2020 0732  Last data filed at 12/08/2020 1000  Intake 480 ml   Output --   Net 480 ml     Physical Exam:   General: sitting up  comfortably in bed  CV: normal rate and regular rhythm, no murmurs/rubs/gallops  Pulmonary: breathing comfortably on room air, lungs clear to auscultation bilaterally  Abdomen: non-tender  MSK: R foot with wound vac in place and covered with clean, dry, intact dressing  Neuro: alert and oriented  Psych: limited historian, affect congruent to mood    Labs (last 24 hours):   Chemistries  CBC  LFT  Gases, other   - - - -   -   AST: - ALT: -  -/-/-/-  -/-/-/-   - - -   - >< -  AP: - T bili: -  Lact (a): - Lact (v): -   eGFR: - Ca: -   -   Prot: - Alb: -  Trop I: - D-dimer: -   Mg: - PO4: -  ANC: -     BNP: - Anti-Xa: -     ALC: -    INR: -  ASSESSMENT/PLAN      Ronnie Mason is a 70 year old male with a history of hypertension, hyperlipidemia, AUD on naltrexone, chronic pain, and 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     #Right foot abscess  #Right foot cellulitis  LRINEC score 4(CRP >150 mg/L). Afebrile and HDS. MRI of right footpreviously declined by patient due to concern of cost. Now s/pwound irrigation,CTX and vancomycin, and s/p surgical debridement on 5/3 by podiatry. Deep tissue culture with 2+ MRSA. Per podiatry, plan he for wound vac and delayed closure, potentially on 5/9.   -Doxycyline 100 mg twice daily (started 5/7), per Dr. Maudie Mason he should continue oral antibiotics for another 10 days  -Dr. Maudie Mason with podiatry following, appreciate recommendations   -MRI of right foot negative for tenosynovitis   -Dr. Maudie Mason plans to evaluate on Monday morning, 12/09/2020, with plan for wound closure around 1700; NPO 0800 5/9    -Per Dr. Maudie Mason, he was instructed nonweightbearing for at least 3 to 4 weeks for the incision to heal.     Ronnie Mason plans to drive his car and possibly his tractor, and we are discouraging this for the next 3  to 4 weeks to facilitate wound healing.  -Prior to discharge, plan teaching with caregiver regarding nonweightbearing status, return precautions, and antibiotics. Daughter Ronnie Mason [phone]) will be picking him up.  -Discontinued IV ceftriaxone (5/3-5/4)  -Discontinue IV vancomycin (5/3 - 5/7)  -f/u blood cultures, NGTD  -f/u wound cultures (as above)  -Pain control with PO analgesicas below and low dose oxycodone, IV morphine for breakthrough pain   -Td booster 5/6 (Per query of [facility], last Tdap 11/30/2015, printed out a copy of combined immunization record)    #Chronic pain   Followed by pain clinic, last seen 11/22/20. No changes in medication regimen.   -Continue pregabalin 300 mg BID  -Continue duloxetine 60 mg BID  -Continue acetaminophen up to 2 gm per day  -Continue diclofenac gel    #AUD   11/13/20: 380 mg Naltrexone XR administered to L dorso-gluteal. Prefers dorso-gluteal injections to right side, okay with ventro-gluteal or dorso-gluteal to left side. Reports last drank about 8 months ago.   -Next injection due5/06/2021    #Hypertension  #Hyperlipidemia   Normotensive in ED. No cardiac symptoms reported. Stable on current medication regimen.   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

## 2020-12-09 NOTE — Nursing Note (Signed)
Patient Summary  70 year old male who presents R foot abscess. Admitted s/p R foot plantar I&D by podiatry, Dr.Kim on  5/3      PMHx: HTN, HLD, AUD on naltrexone, chronic pain, TBI        A&O x4. Sleeping well. Wnd vac 75. Small amt of drg. CMS ok. Pain controlled well with Oxy and APAP. Will be NPO at 0800 for OR 1700. Up in room self. Reminded of NWB status.  Edited by: Tawni Carnes at 12/09/2020 0332    Illness Severity  Stable  Edited by:

## 2020-12-09 NOTE — Progress Notes (Signed)
Social Work & Care Coordination Initial Assessment    Patient Summary:      Juergen Hardenbrook is a 70 year old male who was admitted for R foot abscess, s/p IV antibiotics, I&D by podiatry 5/3, and wound vacuum in place, with plan for wound closure by podiatry on 5/9.     RNCC spoke with Mr Buffalo and discussed discharge planning.  Probable DC tomorrow if remains stable post R foot wound closure.  Patient stated his daughter will pick him up and care for him upon DC.  He stated he will self purchase a knee scooter if he feels he needs one.  No Case Management needs noted at this time.       Home Living  Home Living  Type of Home: House (3 story)  Entry Stairs: 13  Entry Stairs Railings: Right railing  Indoor Stairs: 10 (main level down to basement)  Indoor Stairs Railings: Right railing  Lives With: Spouse  Mobility Equipment Owned: Single point cane;Front wheeled or pick-up walker;Axillary crutches (could rent power scooter)  Durable Medical Equipment Owned: Grab bars in shower;Grab bars around toilet    Prior Level of Function  Prior Function  Prior Level of Function: Independent with all functional mobility and ADLs  Details on Prior Level of Function: indep with ADLs. amb with no AD.  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 No      Assessment/Anticipated discharge needs: Home  Patient's goal for discharge:   Home    Offered choice regarding discharge options and facilities: yes        Vulnerability Screening  Functional Limitations Prior to Admission:      Living Situation prior to admission: Home  Support Systems: Spouse/significant other  Access to healthcare prior to admission: No/minimal barrier to healthcare  Interpreter status: Yes   Language: English      Providers/community agencies:  PCP: [name], MD  Patient Care Team: Patient Care Team:  [name], MD as PCP - General (Internal Medicine)  [name], MD (Internal  Medicine)  Ward Chatters, MD as Cardiologist  (Cardiology)  Geri Seminole, MD (Sports Medicine)  Holli Humbles, DPM (Podiatry)  Dixon Boos, MD (Orthopedic Surgery)  Clydene Pugh, MD (Gastroenterology)  Casandra Doffing, MD (General Surgery)  Reggie Pile, ARNP (Urology)  Alphia Moh, DPM (Podiatry)  Renea Ee, MD as Primary Contact (Pain Management)  Drenckpohl, Rolan Bucco, RN as Registered Nurse (Nursing)  Doreene Nest, MD as Surgeon (Otolaryngology)  Preferred Pharmacy: [facility] [phone] [address] [place] [phone]    [facility] [phone] [address] [place] [phone]           Primary contact information:  Extended Emergency Contact Information  Primary Emergency Contact: Ned Grace"  Address: [address]           [place], [place] [address] [place]  Home Phone: [phone]  Mobile Phone: [phone]  Relation: Spouse  Secondary Emergency Contact: [facility] [place]  Home Phone: [phone]  Mobile Phone: [phone]  Relation: Daughter    Financial Screening  Current funding:   Insurance Information                [employer] PPO Phone: [phone]    Subscriber:  Ritchie, Klee Subscriber#: [id number]    Group#: [id number] Precert#: --            Health Care Decision Making:  Code status: Full Code  POLST: <no information>  Does the patient have a Healthcare DPOA? No   If so, is the paperwork scanned into the chart?  No    Elliot Dally, RN

## 2020-12-09 NOTE — Nursing Note (Signed)
GENERAL PATIENT SHIFT NOTE:    PO Intake: NPO all day    GI: bm recently     GU: voiding normally     Oxygen Needs: none     Vital Trends Today: VSS     Activity: NWB R foot, still noncompliant and ambulating in room, attempting heel weight bearing     Pain:  Pain is well controlled on current regimen     Skin: wound vac dressing came off, replaced with a wet to dry dressing instructions per woundcare ARNP     PRN Meds Given: pain meds     Patient Concerns: none    Other: went to OR late, at 76. Dinner ordered for pt to consume when he returns from OR

## 2020-12-09 NOTE — Nursing Note (Signed)
Patient Summary  70 year old male who presents R foot abscess. Admitted s/p R foot plantar I&D by podiatry, Dr.Kim on  5/3      PMHx: HTN, HLD, AUD on naltrexone, chronic pain, TBI        A&O x4. Up from PACU per strecher. Vss.  Pain controlled well with Oxy and APAP. Cast RLE D/I. CMS ok.  Edited by: Tawni Carnes at 12/09/2020 0332    Illness Severity  Stable  Edited by:

## 2020-12-09 NOTE — Op Note (Signed)
Podiatry Operative Note   Ronnie Mason - DOB: 12/22/1950 70 year old male) MRN: [id number]  Procedure Date: 12/09/2020 [facility] OR       Preoperative Diagnosis:   Abscess of foot [L02.619]    Post Operative Diagnosis:       * Abscess of foot [L02.619]    Procedures Performed:    CLOSURE, WOUND, FOOT, DELAYED    V TO Y SKIN ADVANCEMENT - Right      Surgeons:     * Elie Goody, DPM - Primary   I, Sena Hitch, DPM, attending surgeon, was present for the entire procedure(s) and performed the entire procedure(s).  Resident was unavailable to assist.      Anesthesia:  General and Local M.A.C.   EBL: 50 mL   Specimens: None   Wound Class: Procedure(s):  CLOSURE, WOUND, FOOT, DELAYED, PRIMARY, V TO Y SKIN ADVANCEMENT - Wound Class: Class IV/ Dirty or Infected     Indication(s): Ronnie Mason is an 70 year old male with open wound s/p I & D and wound VAC/Doxycycline. Recent MRI reveal no residual abscess.      Finding(s): There is good granulation tissue noted with minimal residual necrotic tissue.  Following mild debridement wound, it is apparent that there is wide central plantar forefoot open that require skin procedure such as sliight rotation skin flap and V-Y skin advancement on proximal plantar heel to reduce skin tension and help close the wound.     Procedure Details:   The patient was brought to the operating room and positioned supine setting.  All phases of SCOAP 1, 2, 3, 4 were followed for the surgery.  Preoperatively, the patient received 100 mg doxycycline IV.   Following MAC anesthesia, local block was given.  A tourniquet was not used to visualize skin perfusion.      Delayed wound closure was performed following fashion on right plantar arch/forefoot: The wound was irrigated with normal saline and carefully assessed.  Any residual necrotic/nonviable tissue as well overgrown granulation tissue were excised/debrided.  Skin edges were freshened in preparation for skin  closure.  All superficial bleeders cauterized.   There is wide open in plantar central forefoot in transverse fashion. The incision lengthening medially in curve fashion where proximal skin is rotated laterally.  The surgical site was then thoroughly irrigated normal saline.   The central plantar was able to close with multiple vertical mattress technique 2-0 and 3.0 Surgipro.  However the central skin longitudinally is tight causes tension on the central distal closure.  At this point, I elected to performed V-Y skin advancement/lengthening proximally just distal the heel.      Approximally 6 cm V incision was made on anterior plantar heel.  The plantar fascia was release as well.  This provide decrease in tension to the distal closure.  The surgical site was then thoroughly irrigated normal saline  The skin was closed with 2-0 and 3-0 Surgipro.      The dressing entails Steri-Strip, Adaptic soaked in Betadine, multiple 4 x 4, web roll, ABD, Kerlix and Ace.  Jones splint was apply to prevent accidental forefoot loading.      Patient tolerated the procedure and anesthesia well.  Patient left the OR to recovery with vital signs stable and normal.  The vascular is intact by noting good capillary filling time on the skin edges.  Tourniquet was not used. .    Plan: Patient was instructed to non weight-bear  approximately 3 weeks using Jones splint.  Patient follow approximately 1-2 for postop check.  He can be discharge to home tomorrow.  I do recommend at 10 days of oral antibiotic.      Prognosis: Good    Complications: None.       Condition: stable

## 2020-12-09 NOTE — Nursing Note (Signed)
ADT Time:   Transported From: pacu  To:  419  Transportation Method:  stretcher  Accompanied By:  pacu rn  Condition on Arrival:  Alert and oriented x4  SBAR Report Provided to:    Location of Personal Belongings:    List Completed:    Items in [facility] or Pharmacy Returned to Patient:    Comments:  Patient was able to scoot from the stretcher to the bed.  Right foot is in a splint.

## 2020-12-09 NOTE — Anesthesia Postprocedure Evaluation (Signed)
Patient: Ronnie Mason    Procedure Summary     Date: 12/09/20 Room / Location: [facility] OR 08 / [facility]    Anesthesia Start: 1909 Anesthesia Stop: 2041    Procedure: CLOSURE, WOUND, FOOT, DELAYED, PRIMARY, V TO Y SKIN ADVANCEMENT (Right Foot) Diagnosis:       Abscess of foot      (Abscess of foot [L02.619])    Surgeons: Alphia Moh, DPM Responsible Provider: Ronda Fairly, MD    Anesthesia Type: general ASA Status: 2        Final Anesthesia Type: general        Place of evaluation: PACU    Patient participation: patient participated    Level of consciousness: fully conscious    Patient pain control satisfaction: patient is satisfied with level of pain control    Airway patency: patent    Cardiovascular status during assessment: stable    Respiratory status during assessment: breathing comfortably    Anesthetic complications: no    Intravascular volume status assessment: euvolemic    Nausea / vomiting: patient is not experiencing nausea      Planned post-operative disposition at time of assessment: ward care

## 2020-12-09 NOTE — Progress Notes (Signed)
Progress Note     Ronnie Mason") - DOB: 1951/02/16 70 year old male)  Gender Identity: Male  Preferred Pronouns: he/him/his  Admit Date: 12/02/2020  Code Status: Full Code       CHIEF CONCERN / IDENTIFICATION:  Ronnie Mason is a 70 year old male with open wound status post I&D right foot.     SUBJECTIVE       INTERVAL HISTORY:  Patient was seen at bedside this morning today.  Overall he stated no complaint.  Pain is well controlled.  He denies of fever and chill/nausea vomiting.    SCHEDULED MEDICATIONS:   amLODIPine, 5 mg, BID  .  atorvastatin, 10 mg, Daily  .  doxycycline hyclate, 100 mg, BID  .  DULoxetine, 60 mg, BID  .  enoxaparin, 40 mg, Daily  .  lidocaine, 1 patch, Daily  .  lisinopril, 20 mg, q12h  .  [Held By Provider] naltrexone, 380 mg, q21 days  .  pregabalin, 300 mg, BID  .  primidone, 50 mg, BID    INFUSED MEDICATIONS:       PRN MEDICATIONS:  .  acetaminophen, 650 mg, q4h PRN  .  diclofenac, 4 g, QID PRN  .  morphine, 2-4 mg, q6h PRN  .  ondansetron, 4 mg, q8h PRN  .  ondansetron, 4 mg, q8h PRN  .  oxyCODONE, 2.5-5 mg, q4h PRN  .  senna, 17.2 mg, BID PRN       OBJECTIVE     Vitals (Most recent in last 24 hrs)     T: 36.8 C (12/08/20 1700)  BP: (!) 115/49 (12/09/20 0657)  HR: 76 (12/09/20 0657)  RR: 20 (12/09/20 0657)  SpO2: 97 % (12/09/20 0657) Room air  T range: Temp  Min: 36.8 C  Max: 36.8 C  Admit weight: (!) 108.9 kg (240 lb) (12/02/20 1500)  Last weight: (!) 108 kg (238 lb 1.6 oz) (12/06/20 0431)       I&Os:     Intake/Output Summary (Last 24 hours) at 12/09/2020 0747  Last data filed at 12/08/2020 1000  Intake 480 ml   Output --   Net 480 ml       Physical Exam  The wound vacs are intact.  All the digits are pink and good.  Capillary filling time noted.    The wound VAC was removed the wound was assessed.  There is evidence of good granulation tissue without major drainage and no erythema noted.    Labs (last 24 hours):   Chemistries  CBC  LFT  Gases, other   - -  - -   -   AST: - ALT: -  -/-/-/-  -/-/-/-   - - -   - >< -  AP: - T bili: -  Lact (a): - Lact (v): -   eGFR: - Ca: -   -   Prot: - Alb: -  Trop I: - D-dimer: -   Mg: - PO4: -  ANC: -     BNP: - Anti-Xa: -     ALC: -    INR: -        MICRO:   I have personally reviewed the latest Micro results and cultures           ASSESSMENT/PLAN      Ronnie Mason is 70 year old male status post I&D right foot with wound VAC and currently is on doxycycline 100 mg twice daily.  Based  on exam today the wound is ready to be closed today.  I have placed an order for wound closure this evening after my clinic hours.  He was instructed nonweightbearing for at least 3 to 4 weeks for the incision to heal.  I suspect the level be able to close the wound but he was also informed of possible some section may need wound care or secondary wound healing later.  If all goes well he may be discharged tomorrow.  Additionally I recommend that he should continue oral antibiotics for another 10 days for precaution measure.  We should consider possible ID consult for appropriate oral antibiotics.    He was instructed to n.p.o. after 8 AM.

## 2020-12-09 NOTE — Anesthesia Preprocedure Evaluation (Addendum)
Patient: Ronnie Mason    Procedure Information     Date/Time: 12/09/20 1825    Procedure: CLOSURE, WOUND, FOOT, DELAYED, PRIMARY (Right Foot)    Location: [facility] MAIN OR 08 / [facility] OR    Surgeons: Wilmon Arms Dong-Hyun, DPM        HPI:   S/p right foot I&D 12/03/20, here today for delayed wound closure.    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 acetaminophen (TYLENOL) 1,000 mg, Oral, 2 times daily   . Alpha-Lipoic Acid 300 MG tablet 1 tablet, Oral, Daily   . amLODIPine 5 MG tablet TAKE ONE TABLET BY MOUTH TWICE DAILY   . atorvastatin (LIPITOR) 10 mg, Oral, Daily, Restart 09/19/2020   . BENFOTIAMINE OR 250 mg, Oral, Daily   . Cyanocobalamin 1000 MCG Oral Tab one per day over-the-counter started 5/21 /2012 this level borderline low   . diclofenac (VOLTAREN) 4 g, Topical, 4 times daily, Apply to 2 gram four times daily to neck, trapezius muscle, and low back. Use a max of 32 grams a day.   . DULoxetine 60 MG DR capsule TAKE 1 CAPSULE BY MOUTH TWICE DAILY   . EPINEPHrine 0.3 MG/0.3ML auto-injector Inject as instructed per patient package insert, 0.3 mg intramuscularly or subcutaneously into the thigh, if needed to treat anaphylaxis   . ibuprofen (MOTRIN) 200 mg, Oral, Daily PRN   . lidocaine 5 % patch 1 patch, Transdermal, Daily, Apply to painful area for up to 12 hours in a 24 hour period.   [name] lisinopril (PRINIVIL; ZESTRIL) 20 mg, Oral, Every 12 hours   . naltrexone (REVIA) 50 mg, Oral, Daily   . pregabalin 300 MG capsule take 1 capsule by mouth twice a day   . primidone (MYSOLINE) 50 mg, Oral, 2 times daily    . Vitamin D3 2,000 units, Oral, Daily, For low level                Review of patient's allergies indicates:  Allergies   Allergen Reactions   . Adhesives Skin: Rash   . Bee Venom Skin: Hives, Skin: Itching and Swelling   . Gabapentin      Flu like symptoms, diarrhea, body ache upset stomach   . Methocarbamol Other     Patient's wife reports cognitive difficulties ("goofy")       Social History:     Medical History and Review of Systems      Source of information: In person visit and Chart review.  Previous anesthesia: Yes (general)    History of anesthetic complications  (-) History of anesthetic complications.      Pulmonary   (-) asthma  (-) shortness of breath  (-) sleep apnea  (-) wheezing    Neuro/Psych   (-) seizures  (-) CVA    Cardiovascular PMH Hypotension but negative cardiac workup  ECG reviewed  (+) hypertension  (+) hyperlipidemia  (-) past MI  (-) CAD    Musculoskeletal   (-) cervical  spine disease  GI/Hepatic/Renal No GERD/N/V symptoms today  (+) renal disease, CKD  (-) GERD    Endo/Immunology   (-) diabetes  (-) hypothyroidism            Physical Exam  Airway  Mallampati:  II  Upper Lip Bite Test: II  TM distance:  <6 cm  Neck ROM:  Limited(Extension)    Dental       Cardiovascular  normal      Pulmonary  normal             Labs: (last year)    BMP  CBC/Coags   Na 140 12/05/2020  Hb 14.7 12/05/2020   K 3.7 12/05/2020  HCT 45 12/05/2020   Cl 100 12/05/2020  WBC 6.49 12/05/2020   HCO3 31 12/05/2020  PLT 252 12/05/2020   BUN 18 12/05/2020  INR - -   Cr 0.88 12/05/2020  PT - -   Glu 93 12/05/2020  PTT - -       Misc   eGFR >60 12/05/2020  MCV 101 (H) 12/05/2020   A1C - -  BNP - -       LFTs   AST 20 12/02/2020  Albumin 3.8 12/02/2020   ALT 15 12/02/2020  Protein 6.9 12/02/2020   Alk Phos 71 12/02/2020  T Bili 0.4 12/02/2020         Relevant procedures / diagnostic studies:     PAT CLINIC DISCUSSION      ANESTHESIA PLAN   Informed Consent:     Anesthesia Plan discussed with:        Patient    ASA Score:     ASA: 2  Planned Anesthetic Type:       general  Supervising Provider Comments:      TIVA      Risk Calculators / Scores:     PONV: Intermediate Risk  Total Score: 2            Non-smoker     Intended opioid administration        Criteria that do not apply:    Male patient    History of PONV    History of motion sickness

## 2020-12-10 ENCOUNTER — Telehealth (INDEPENDENT_AMBULATORY_CARE_PROVIDER_SITE_OTHER): Payer: Self-pay | Admitting: Podiatrist

## 2020-12-10 DIAGNOSIS — L02611 Cutaneous abscess of right foot: Secondary | ICD-10-CM | POA: Diagnosis present

## 2020-12-10 MED ORDER — DOXYCYCLINE HYCLATE 100 MG OR CAPS
100.0000 mg | ORAL_CAPSULE | Freq: Two times a day (BID) | ORAL | 0 refills | Status: AC
Start: 2020-12-10 — End: 2020-12-20

## 2020-12-10 MED ORDER — OXYCODONE HCL 5 MG OR TABS
5.0000 mg | ORAL_TABLET | ORAL | 0 refills | Status: AC | PRN
Start: 2020-12-10 — End: 2020-12-13

## 2020-12-10 NOTE — Discharge Summary (Signed)
Discharge Summary     Ronnie Mason") - DOB: [birth year] 70 year old male)  Gender Identity: Male  Preferred Pronouns: he/him/his  PCP: Donnal Moat, MD   Code Status: Full Code        DATE OF ADMISSION: 12/02/2020  DATE OF DISCHARGE: 12/10/20  DISCHARGE TEAM & ATTENDING: [facility], Carolina Cellar, MD     ADMISSION DIAGNOSIS:   Right foot abscess   Right foot cellulitis     DISCHARGE DIAGNOSIS:   Right foot - Delayed wound closure     PROBLEMS ADDRESSED DURING THIS HOSPITALIZATION:   Principal Problem (Resolved):    Abscess  Active Problems:    Cellulitis and abscess of foot  Resolved Problems:    Abscess of foot    Abscess of right foot      DISCHARGE FOLLOW-UP VISITS/APPOINTMENTS:    Upcoming appointments at [facility]:  Future Appointments   Date Time Provider [place]   12/20/2020  1:00 PM Art Buff, MD [facility] PAIN CL   01/03/2021  2:30 PM Art Buff, MD [facility] PAIN CL        Additional follow-up:  Donnal Moat, MD  [address] [place] [address]  [phone]    Follow up in 1 week(s)      Wilmon Arms [place], [address]  [place] [address]  [phone]    Follow up in 2 week(s)        PENDING RESULTS THAT REQUIRE FOLLOW-UP (as of this summary):  Pending Labs     Order Current Status    Culture Wound, Fungal w/ Stain Preliminary result          DIAGNOSTIC STUDIES RECOMMENDED:  None    INCIDENTAL FINDINGS THAT REQUIRE FOLLOW-UP:   None      THERAPEUTIC RECOMMENDATIONS:   Complete course oral antibiotics (doxycycline twice a day until 5/17)  Non-weight bearing on right foot for 3 weeks using Jones splint; scooter to remain NWB  Follow up in 1-2 weeks with podiatry for post op check     ALLERGIES:  Adhesives, Bee venom, Gabapentin, and Methocarbamol      DISCHARGE MEDICATIONS:   Current Discharge Medication List      START taking these medications    Details   doxycycline hyclate 100 MG  capsule Take 1 capsule (100 mg) by mouth 2 times a day for 10 days.  Qty: 20 capsule, Refills: 0      oxyCODONE 5 MG tablet Take 1-2 tablets (5-10 mg) by mouth every 4 hours as needed for moderate pain or severe pain for up to 3 days. Up to 2 tabs every 4 hours on day 1; decrease to 1 tab every 4 hours by day 2.  Qty: 30 tablet, Refills: 0         CONTINUE these medications which have NOT CHANGED    Details   Acetaminophen 500 MG Oral Tab Take 1,000 mg by mouth 2 times a day.    Associated Diagnoses: Pain      Alpha-Lipoic Acid 300 MG tablet Take 1 tablet by mouth daily.      amLODIPine 5 MG tablet TAKE ONE TABLET BY MOUTH TWICE DAILY  Qty: 180 tablet, Refills: 3    Associated Diagnoses: H/O cerebral parenchymal hemorrhage; Essential hypertension      atorvastatin 10 MG tablet Take 1 tablet (10 mg) by mouth daily.  Restart 09/19/2020  Qty: 90 tablet, Refills: 1    Associated Diagnoses: Hyperlipidemia, unspecified hyperlipidemia type      BENFOTIAMINE OR Take 250 mg by mouth daily.      Cholecalciferol (VITAMIN D3) 2000 units Oral Cap Take 1 capsule (2,000 Units) by mouth daily. For low level  Qty: 1 capsule, Refills: 1    Associated Diagnoses: Vitamin D deficiency      Cyanocobalamin 1000 MCG Oral Tab one per day over-the-counter started 5/21 /2012 this level borderline low  Qty: 1 Tab, Refills: 1    Associated Diagnoses: B12 deficiency      diclofenac 1 % gel Apply 4 g topically 4 times a day. Apply to 2 gram four times daily to neck, trapezius muscle, and low back. Use a max of 32 grams a day.  Qty: 300 g, Refills: 4    Associated Diagnoses: Neck pain      DULoxetine 60 MG DR capsule TAKE 1 CAPSULE BY MOUTH TWICE DAILY  Qty: 180 capsule, Refills: 2    Associated Diagnoses: Chronic pain syndrome; Cervicalgia      EPINEPHrine 0.3 MG/0.3ML auto-injector Inject as instructed per patient package insert, 0.3 mg intramuscularly or subcutaneously into the thigh, if needed to treat anaphylaxis  Qty: 4 each, Refills: 1     Comments: H/o bee allergy  Associated Diagnoses: Bee sting allergy      ibuprofen 200 MG tablet Take 200 mg by mouth daily as needed (pain).      lidocaine 5 % patch Apply 1 patch onto the skin daily. Apply to painful area for up to 12 hours in a 24 hour period.  Qty: 30 patch, Refills: 5    Associated Diagnoses: Cervicalgia; Lumbosacral radiculopathy at S1      lisinopril 20 MG tablet Take 1 tablet (20 mg) by mouth every 12 hours.  Qty: 180 tablet, Refills: 1    Associated Diagnoses: Essential hypertension      naltrexone 50 MG tablet Take 1 tablet (50 mg) by mouth daily.  Qty: 30 tablet, Refills: 1    Associated Diagnoses: Alcohol use disorder, severe, dependence (HCC)      pregabalin 300 MG capsule take 1 capsule by mouth twice a day  Qty: 60 capsule, Refills: 2    Associated Diagnoses: Neuropathic pain      primidone 50 MG tablet Take 1 tablet (50 mg) by mouth 2 times a day.  Qty: 180 tablet, Refills: 0    Associated Diagnoses: Essential tremor         STOP taking these medications       cephalexin 500 MG capsule Comments:   Reason for Stopping:         diclofenac 1 % gel Comments:   Reason for Stopping:               BRIEF ADMISSION HISTORY:   Per HPI written on 12/02/20  "70 y/o male with hx of HTN, HLD, AUD and chronic low back and right hip pain who presents with right foot abscess. Patient reports 5 days ago he was working on his garden and felt a weird sensation while shoveling.  He felt like he pulled something and had pain underneath his right foot.  He was wearing work boots with thick soles.  No known trauma.  The next day he noticed there was redness on the sole of his right foot and over the course of 1 day he had a painful bump that he was trying to express.  Over the next 24 to 48 hours he noticed an open wound with thick/cottage cheese-like drainage. It was difficult for him to bear weight due to aching and throbbing pain. He was seen in urgent care 4 days after onset, thought to be a gout flare.  Was not given any medications.  He then followed up with podiatry, Dr. Maudie Mercury this morning, irrigated foot with saline (patient reports he removed pieces of wood from wound), obtained foot XR and plan for I&D tomorrow 5/3.  He denies fever, chills, abdominal pain, diarrhea, constipation, chest pain, shortness of breath.  Of note he has chronic low back and right hip pain for which he sees pain clinic as well as AUD on naltrexone and is actively engaged with addiction psychiatry.    ED course:   Patient was afebrile, heart rate 80-81, RR 16 and normotensive.  SPO2 95% on room air.  CMP notable for BUN 32, otherwise normal.  CBC with white count 10.1, H/H 14.2/43.  ESR 58.  CRP to 15.8.  Blood cultures were collected.  He was given ceftriaxone and vancomycin.  He received morphine 4 mg with improvement in pain.  He received 1 L LR and Zofran for nausea. 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     He is being discharged with a course of doxycycline BID to stop 5/17. He is being discharged with 3 days worth of oxycodone for pain management. He should remain non-weight bearing to the RLE for 3-4 weeks and follow up with Podiatry in 2-3 weeks. He will receive a prescription for a scooter and is informed that he must not drive with his foot in this condition.     More detail, by problem:     #Right foot abscess   #Right foot cellulitis   LRINEC score 4 (CRP >150 mg/L). Afebrile and HDS. MRI of right foot previously declined by patient due to concern of cost. Now s/p wound irrigation, CTX and vancomycin, and s/p surgical debridement on 5/3 by podiatry. Deep tissue culture with 2+  MRSA. Delayed wound closure on 5/9 with Dr. Maudie Mercury.   -Doxycyline 100 mg twice daily (start 5/7, stop 5/17)  -Discontinued IV ceftriaxone (5/3-5/4)  -Discontinue IV vancomycin (5/3 - 5/7)  -pt should be NWB to RLE, would benefit from R knee scooter, advised pt of this    #Chronic pain   Followed by pain clinic, last seen 11/22/20. No changes in medication regimen.   -Continue pregabalin 300 mg BID  -Continue duloxetine 60 mg BID  -Continue acetaminophen up to 2 gm per day  -Continue diclofenac gel     #AUD    11/13/20: 380 mg Naltrexone XR administered to L dorso-gluteal. Prefers dorso-gluteal injections to right side, okay with ventro-gluteal or dorso-gluteal to left side. No symptoms of alcohol withdrawal reported/identified.   -Next injection due 12/11/2020     #Hypertension  #Hyperlipidemia   Blood pressure within normal range throughout hospitalization. No cardiac symptoms reported. Continued home medication regimen.   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)   #Cognitive impairment  Per chart, "mild cognitive difficulty as a consequence of his cerebral microvascular disease and his prior thalamic hemorrhage and mild traumatic brain injury."       DISPOSITION:    01 HOME/SELF CARE [01]    CONDITION: good     CONSULTS COMPLETED:    ED GENERAL CONSULT  ED GENERAL CONSULT  INPATIENT CONSULT TO WOUND THERAPY     OPERATIONS/PROCEDURES:  Surgical/Procedural Cases on this Admission     Case IDs Date Procedure Surgeon Location Status    [phone] 12/03/20 RIGHT WOUND INCISION AND DRAINAGE Wilmon Arms Dong-Hyun, DPM [facility] MAIN OR Comp    [id number] 12/09/20 CLOSURE, WOUND, FOOT, DELAYED, PRIMARY, V TO Y SKIN ADVANCEMENT Wilmon Arms Dong-Hyun, DPM [facility] MAIN OR Comp          Additional procedures:  None     PRINCIPAL DIAGNOSTIC STUDIES/RESULTS:      MRI Foot 12/05/2020  Limited examination secondary to moderate motion  degradation.  1.  Postsurgical changes of recent plantar foot wound incision and drainage. Minimal fluid within the flexor hallucis longus tendon sheath, likely related to recent surgical debridement. Normal tendon caliber and signal.  2.  No evident residual abscess.    XR R Foot 12/02/2020  As before, patient is status post arthrodesis of the first MTP joint. There is interval osseous modeling about the site of prior surgical hardware.  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 Remaining joint spaces are well-preserved.  Scattered vascular calcifications.    Discharge Orders   Ambulatory Assistive Device       DISCHARGE PHYSICAL EXAM:   Vitals (Most recent in last 24 hrs)     T: 36.6 C (12/10/20 0801)  BP: 133/77 (12/10/20 0801)  HR: 77 (12/10/20 0801)  RR: 18 (12/10/20 0801)  SpO2: 94 % (12/10/20 0801) Room air  T range: Temp  Min: 36.2 C  Max: 36.6 C  Admit weight: (!) 108.9 kg (240 lb) (12/02/20 1500)  Last weight: (!) 108 kg (238 lb 1.6 oz) (12/06/20 0431)       Physical Exam  Per Dr. Marikay Alar 12/10/20  General: sitting up comfortably in bed, no acute distress  CV: normal rate and regular rhythm, no murmurs/rubs/gallops  Pulmonary: breathing comfortably on room air, lungs clear to auscultation bilaterally  Abdomen: non-tender  MSK: R foot with cast on, clean/dry   Neuro: alert and oriented  Psych: mood congruent with affect    [facility] physicians mentioned in this note can be reached by calling [employer] at [phone]. If any part of this transcript is missing or to request other transcripts for this patient call [phone]. For online access to patient records enroll in [employer] at [email or web address].

## 2020-12-10 NOTE — Significant Event (Signed)
Post Fall Vitals  Temp: 36.6 C  Temp src: Oral  Resp: 18  SpO2: 94 %  BP: 133/77  Pulse: 77    Post Fall Notifications  Name of Provider Notified: Dr Adine Madura  Time Provider Notified: 0740  Name of Person Who Notified Provider: Baird Lyons  Family Notified of Fall: No    Post Fall Assessment  Neurological  Neuro [facility]): Within Defined Limits  Level of Consciousness: Alert  Orientation Level: Oriented X4  Cognition: Impulsive, Poor safety awareness, Poor judgement  Speech: Clear  Swallow: Able to swallow solids and liquids without difficulty  Pupil Assessment: No  Hand Grasp/Motor Function/Sensation Assessment: Motor strength  R Hand Grasp: Strong  L Hand Grasp: Strong  RUE Motor Response: Responds to commands  RUE Sensation: Full sensation  RUE Motor Strength: 5-Full strength  LUE Motor Response: Responds to commands, Normal extension  LUE Sensation: Full sensation, No pain  LUE Motor Strength: 5-Full strength  RLE Motor Response: Responds to commands, Normal extension, Normal flexion, Other (Comment) (Non Wht bering)  RLE Sensation: Decreased, Numbness  RLE Motor Strength: 4-Can move/lift against some resistance, not full strength  LLE Motor Response: Responds to commands, Normal extension, Normal flexion  LLE Sensation: Decreased, Numbness  LLE Motor Strength: 5-Full strength  Neuro Symptoms: Irritable  Glasgow Coma Scale  Best Eye Response: Spontaneous  Best Verbal Response: Oriented  Best Motor Response: Follows commands  Glasgow Coma Scale Score: 15  Hypoglycemia Protocol/Treatment  Is the patient experiencing symptoms of hypoglycemia?: No  Hypoglycemia treatment administered per protocol?: No    Post Fall Information  Circumstances Leading to Fall  Environmental risks: Toliting needs  Other risks specific to the patient: Non compliant with call light, bed alm, mat.  Fall Documentation  Date of fall: 12/10/20  Time of fall: 0705  What patient said about the fall: I sliped  How was patient discovered: sitting on  floor  Event Location: Patient Room  Type of Fall: Loss of Balance, Toileting Related  Witnessed or assisted fall?: Unwitnessed fall  Anyone else involved?: No  Did injury occur?: No  Patient transferred to higher level of care due to fall?: No

## 2020-12-10 NOTE — Progress Notes (Signed)
ADT Time: 1940  Transported From: 30  To:  Home  Transportation Method: wheelchair  Accompanied By:  Transporter (CNA)    All belongings and discharge instructions with patient. PIV D/C by day shift nurse.

## 2020-12-10 NOTE — Telephone Encounter (Signed)
General Message:    Detailed Message: Rousey (RN Case Manager) called to set pt up for post op after surgery with [name]. Should pt be scheduled with [facility] for post op? If so, pls advise when he should be scheduled.   Return Call: General message okay

## 2020-12-10 NOTE — Progress Notes (Signed)
Social Work and Care Coordination/Navigator Discharge Note    Anticipated Discharge Date: Today    Funding: Payor: Multimedia programmer / Plan: Advertising copywriter [employer] PPO / Product Type: Medicare    Discharge Plan: Living situation post discharge: Home        Planned disacharge today, Patients spouse to pick him up.    RNCC called podiatrist office of Dr [name] and they stated they will call patient themselves to set up surgical follow up appointment.      Patient will be staying in [place] on discharge and his wife is available to help him with needs.

## 2020-12-10 NOTE — Discharge Instructions (Addendum)
[name], Ronnie Mason were admitted to [facility] for a right foot abscess. You were placed on IV antibiotics and transitioned to oral antibiotics and will need to continue this until 12/20/20. Podiatry evaluated you and performed and incision and drainage of the wound, placed a wound vac then performed a delayed wound closure. Per their recommendations you are to be non-weight bearing on the right foot, and this includes no driving for 3-4 weeks. You will follow up with podiatry in 1-2 weeks for a post-op wound check.    Please return to the ED if you experience any of the following:   Fever, chills, chest pain, shortness of breath, increased foot swelling or redness.     You will be on an opioid pain medication [oxycodone], which can cause constipation. We recommend you stay hydrated to promote bowel movements. You can also use over the counter medication like Senna or Miralax if staying hydrated is not enough. Your goal is to have at least one bowel movement per day.    It was a pleasure taking care of you.     Family Medicine Team   [facility]

## 2020-12-10 NOTE — Nursing Note (Signed)
ORTHOPEDIC PATIENT NOTE       Pain Control: pain is well controlled on current regimen     PO Intake: good po intake     Therapy Activities Today: cleared by PT    BM/UO:  bm today, voiding normally     Dressing:  CDI cast     Other: plan DC this evening, pt stated his ride would not be available until 1900

## 2020-12-10 NOTE — Progress Notes (Signed)
Physical Therapy  Physical Therapy Treatment    Patient Name: Ronnie Mason  MRN: [id number]  Today's Date: 12/10/2020      General Visit Information  PT Last Visit  PT Received On: 12/10/20  Response to Previous Treatment: Patient with no complaints from previous session.    General  Documentation Type: Progress  Treatment Start Time: 1224  Treatment End Time: 1324  Treatment Duration (min): 60 Mins  Family/Caregiver Present: No    Subjective  Subjective  Patient Report/Self-Assessment: Doing fine, foot is more painful. Willing to work with PT to go home.  Patient Stated Goal: To go home today.    Patient Active Problem List   Diagnosis   . Syncope   . Hypotension   . Carotid Sinus Hypersensitivity   . URI (upper respiratory infection)   . Cervicalgia   . Hyperlipidemia   . Chronic pain   . Bee sting allergy   . Numbness and tingling of leg   . Chronic back pain   . B12 deficiency   . Chronic renal insufficiency, stage III (moderate) (HCC)   . Back pain, lumbosacral   . Lumbar radiculopathy, chronic   . Neck pain   . Fall   . Tremor   . Sensory neuropathy   . 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 myelopathy or radiculopathy   . Idiopathic peripheral neuropathy   . 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)   . History of alcohol dependence (HCC)   . Cerebral ventriculomegaly   . Cerebellar hypoplasia (HCC)   . Essential hypertension   . Essential tremor   . Hx of spontan intraparenchymal intracran bleed assoc with hypertension   . Alcohol abuse   . Intraparenchymal hemorrhage of brain (HCC)   . 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 Balance problem   . Chronic pain of both shoulders   . Vitamin D deficiency   . Difficulty in walking, not elsewhere classified   . Impaired mobility and ADLs   . 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(HCC)   . Primary osteoarthritis of right hip   . Mild vascular neurocognitive disorder (HCC)   . Right lumbar  radiculitis   . Greater trochanteric pain syndrome of right lower extremity   . Complex tear of medial meniscus of left knee as current injury   . Primary osteoarthritis of left knee   . Acute left-sided low back pain without sciatica   . Chronic left-sided low back pain without sciatica   . Enuresis   . Closed fracture of one rib of right side   . MRSA (methicillin resistant staph aureus) culture positive   . Myofascial pain   . Macrocytosis without anaemia   . Cellulitis and abscess of foot         Pain  Pain Assessment  Pain Assessment: Pain Scale  Pain Score: 7  Pain Descriptor Scale: None  Pain Intensity: Rest  Pain Location: foot  Pain Orientation: Right  Pain Frequency: Constant/continuous  Pain Interventions: Medication (See MAR)  Response to Interventions: Yes  Pain Relief Scale: Moderate    Cognition  Cognition  Orientation Level: Oriented X4  Safety Judgment: Decreased awareness of need for safety  Cognition Comments: Not able to observe precautions  Bed Mobility  Bed Mobility  Supine>Sit: Independent  Sit>Supine: Independent  Transfers  Transfers  Sit<>Stand: Supervision / Stand by [employer] device used  Merchandiser, retail Details: Automotive engineer  Assessment     Ambulation     Stairs       Treatment  Therapeutic Exercise     Therapeutic Activity  Therapeutic Activity  Therapeutic Activity Time Entry: 35  Therapeutic Activity 1: discussion at length re POC, PT role, and DC recommendations  Therapeutic Activity 2: edu/ reinforcement on NWB precautions  Therapeutic Activity 3: transfer training sit <> stand with FWW  Therapeutic Activity 4: phone call at end of session with spouse re POC and DC recommendations  Neuromuscular Re-Education     Gait Training  Gait Training  Gait Training Time Entry: 25  Gait Training Activity 1: pt attempted amb in room with FWW and heel touch but unable to tolerate d/t increased pain once standing  Gait Training Activity 2: edu on  recommendations using MWC for HH mobility and scooter for community mobility  Gait Training Activity 3: PT demo stair training using sitting method. utilized stool at top of stairs to ease transition from floor to standing  Wheelchair Management     Orthotic Management     Other Activities           PT Assessment Results: Decreased strength;Decreased endurance;Impaired balance;Decreased mobility  Impact on Function and Participation: Pt received in bed and willing to work with PT. Today pt's OOB mobility limited by pain. Demonstrated SBA transfers sit <> stand from EOB with FWW, and then pt reported significant foot pain once standing and unable to tolerate taking steps to amb. Relief once sitting and elevating foot on bed. Reinforced current NWB precautions and repeat edu on keeping R foot off ground during amb and amb stairs, and pt more agreeable this session. Recommending MWC for mobility in [facility] and MWC and/or knee scooter for community mobility at this time, given his current pain level will likely inhibit safe amb with either FWW or crutches. PT demonstrated stair amb using sitting method while maintaining R LE NWB, and pt stated he's familiar with this and has done this for past injury. Suggested placing a stool at top of stairs to ease transition from floor to standing, and pt agreeable. Spoke with spouse at end of session to discuss POC and DC recs. Pt states they will pick up scooter rental tomorrow morning, and will call dtr to coordinate renting MWC. Anticipate pt will be able to DC home with spouse assist once medically ready. No further IP PT needed but will continue to follow while inpatient.  Prognosis: Good          PT Plan: No skilled PT/Discharge PT     PT Discharge Recommendations: No follow up PT indicated        Equipment Recommended: Lightweight manual wheelchair;Knee scooter      Goals  STG  Goal: Transfer with AD;Independent  Estimated Completion Date: 12/10/20  Goal Status: Adequate for  discharge  STG  Goal: Amb with AD;Independent  Estimated Completion Date: 12/10/20  Goal Status: Updated (pt plans to use MWC and knee scooter for amb)  STG  Goal: Stairs;Set up or clean up assistance (2 STE with 1 HR; 10 stairs inside with 1 HR)  Estimated Completion Date: 12/10/20  Goal Status: Updated (pt agreeable to utilize sitting method to amb stairs)  Epimenio Foot, PT  12/10/2020

## 2020-12-10 NOTE — Nursing Note (Signed)
Patient Summary  70 year old male who presents R foot abscess. Admitted s/p R foot plantar I&D by podiatry, Dr.Kim on  5/3      PMHx: HTN, HLD, AUD on naltrexone, chronic pain, TBI        A&O x4. Non compliant at times. Pain controlled with Iv MS/Oxy and APAP. Sleeping on/off. Vss. Cast RLE D/I. CMS aprs good. Void per urinal.  Edited by: Tawni Carnes at 12/10/2020 0223    Illness Severity  Stable  Edited by:

## 2020-12-11 ENCOUNTER — Telehealth (INDEPENDENT_AMBULATORY_CARE_PROVIDER_SITE_OTHER): Payer: Self-pay | Admitting: Internal Medicine

## 2020-12-11 ENCOUNTER — Ambulatory Visit (HOSPITAL_BASED_OUTPATIENT_CLINIC_OR_DEPARTMENT_OTHER): Payer: Medicare PPO

## 2020-12-11 ENCOUNTER — Encounter (HOSPITAL_BASED_OUTPATIENT_CLINIC_OR_DEPARTMENT_OTHER): Payer: Medicare PPO | Admitting: Addiction (Substance Use Disorder)

## 2020-12-11 ENCOUNTER — Telehealth (INDEPENDENT_AMBULATORY_CARE_PROVIDER_SITE_OTHER): Payer: Self-pay

## 2020-12-11 NOTE — Telephone Encounter (Signed)
Spoke with someone Bonita Quin, patient has been scheduled for discharge visit.    No further action needed.    Closing Encounter.

## 2020-12-11 NOTE — Telephone Encounter (Signed)
RETURN CALL: Voicemail - Detailed Message      SUBJECT:  Appointment Request     REASON FOR VISIT: Hospital discharge & 1 week post op follow up  PREFERRED DATE/TIME: Monday 5/16 anytime,5/17 anytime,or 5/19 noon  ADDITIONAL INFORMATION: none

## 2020-12-11 NOTE — Telephone Encounter (Signed)
Transitional Care Management (TCM) - Post-Discharge Communication within 2 business days    Hospital Name:  [facility]  DATE OF ADMISSION: 12/02/2020  DATE OF DISCHARGE: 12/10/20    DISCHARGE TEAM & ATTENDING: [facility], Theressa Stamps, MD   ADMISSION DIAGNOSIS: Right foot abscess, Right foot cellulitis   DISCHARGE DIAGNOSIS: Right foot - Delayed wound closure     Contact within two business days of discharge date:  YES   Reached patient: Spoke with spouse [name]  Contact type:  Telephone    Patient scheduled for visit with provider (within 7 days high complexity or within 14th days for moderate complexity) YES .   Appointment Date:  5/26 at 1:40pm with Dr Marisa Sprinkles at [facility]. Offered sooner appointment with covering provider, but pt declined.     Other Follow Up Appointments:   * Wed 5/18 at 9:30am with Dr [name], Podiatry  * C[facility]en Essex [facility]    Referrals, home health or community services planned on discharge:  NO.      Medications on discharge from hospital:      START taking these medications    Details   doxycycline hyclate 100 MG capsule Take 1 capsule (100 mg) by mouth 2 times a day for 10 days.  Qty: 20 capsule, Refills: 0      oxyCODONE 5 MG tablet Take 1-2 tablets (5-10 mg) by mouth every 4 hours as needed for moderate pain or severe pain for up to 3 days. Up to 2 tabs every 4 hours on day 1; decrease to 1 tab every 4 hours by day 2.  Qty: 30 tablet, Refills: 0         CONTINUE these medications which have NOT CHANGED    Details   Acetaminophen 500 MG Oral Tab Take 1,000 mg by mouth 2 times a day.    Associated Diagnoses: Pain      Alpha-Lipoic Acid 300 MG tablet Take 1 tablet by mouth daily.      amLODIPine 5 MG tablet TAKE ONE TABLET BY MOUTH TWICE DAILY  Qty: 180 tablet, Refills: 3    Associated Diagnoses: H/O cerebral parenchymal hemorrhage; Essential hypertension      atorvastatin 10 MG tablet Take 1 tablet (10 mg) by mouth daily. Restart 09/19/2020  Qty: 90 tablet, Refills: 1     Associated Diagnoses: Hyperlipidemia, unspecified hyperlipidemia type      BENFOTIAMINE OR Take 250 mg by mouth daily.      Cholecalciferol (VITAMIN D3) 2000 units Oral Cap Take 1 capsule (2,000 Units) by mouth daily. For low level  Qty: 1 capsule, Refills: 1    Associated Diagnoses: Vitamin D deficiency      Cyanocobalamin 1000 MCG Oral Tab one per day over-the-counter started 5/21 /2012 this level borderline low  Qty: 1 Tab, Refills: 1    Associated Diagnoses: B12 deficiency      diclofenac 1 % gel Apply 4 g topically 4 times a day. Apply to 2 gram four times daily to neck, trapezius muscle, and low back. Use a max of 32 grams a day.  Qty: 300 g, Refills: 4    Associated Diagnoses: Neck pain      DULoxetine 60 MG DR capsule TAKE 1 CAPSULE BY MOUTH TWICE DAILY  Qty: 180 capsule, Refills: 2    Associated Diagnoses: Chronic pain syndrome; Cervicalgia      EPINEPHrine 0.3 MG/0.3ML auto-injector Inject as instructed per patient package insert, 0.3 mg intramuscularly or subcutaneously into the  thigh, if needed to treat anaphylaxis  Qty: 4 each, Refills: 1    Comments: H/o bee allergy  Associated Diagnoses: Bee sting allergy      ibuprofen 200 MG tablet Take 200 mg by mouth daily as needed (pain).      lidocaine 5 % patch Apply 1 patch onto the skin daily. Apply to painful area for up to 12 hours in a 24 hour period.  Qty: 30 patch, Refills: 5    Associated Diagnoses: Cervicalgia; Lumbosacral radiculopathy at S1      lisinopril 20 MG tablet Take 1 tablet (20 mg) by mouth every 12 hours.  Qty: 180 tablet, Refills: 1    Associated Diagnoses: Essential hypertension      naltrexone 50 MG tablet Take 1 tablet (50 mg) by mouth daily.  Qty: 30 tablet, Refills: 1    Associated Diagnoses: Alcohol use disorder, severe, dependence (HCC)      pregabalin 300 MG capsule take 1 capsule by mouth twice a day  Qty: 60 capsule, Refills: 2    Associated Diagnoses: Neuropathic pain      primidone 50 MG tablet Take 1  tablet (50 mg) by mouth 2 times a day.  Qty: 180 tablet, Refills: 0    Associated Diagnoses: Essential tremor         STOP taking these medications       cephalexin 500 MG capsule Comments:   Reason for Stopping:         diclofenac 1 % gel Comments:   Reason for Stopping:             Review of Discharge Instructions: No  Patient/Caregiver Understands instructions: YES    Taking medications as prescribed:  YES  Any side effects of medication reported:  NO    Patient reports he/she is able to care for self:  NO  Caregiver involved: YES    Referrals, home health or community services received or scheduled:  N/A     Patient reports concerns:  NO    Caregiver was advised to call the clinic if any concerns noted prior to follow-up appointment with provider.    RN Care Manager  [facility]

## 2020-12-12 NOTE — Telephone Encounter (Signed)
Talked with pt, post op appts made

## 2020-12-14 ENCOUNTER — Other Ambulatory Visit (HOSPITAL_BASED_OUTPATIENT_CLINIC_OR_DEPARTMENT_OTHER): Payer: Self-pay | Admitting: Anesthesiology

## 2020-12-14 DIAGNOSIS — M792 Neuralgia and neuritis, unspecified: Secondary | ICD-10-CM

## 2020-12-16 MED ORDER — PREGABALIN 300 MG OR CAPS
ORAL_CAPSULE | ORAL | 5 refills | Status: DC
Start: 2020-12-16 — End: 2020-12-26

## 2020-12-16 NOTE — Telephone Encounter (Signed)
Medication: Pregabalin  Requested by: Pharmacy    Last refill written: 09/09/20  Written by: Araceli Bouche    Last appointment: 11/22/2020 with Dr. Araceli Bouche  Scheduled follow up: 12/20/2020 with Dr. America Brown PMP Medication Dispense History (from 12/22/2019 to 12/14/2020)     Dispensed Days Supply Quantity   OXYCODONE HCL (IR) 5 MG TABLET 12/10/2020 4 30 unspecified   PREGABALIN 300 MG CAPSULE 11/06/2020 30 60 unspecified   PREGABALIN 300 MG CAPSULE 10/08/2020 30 60 unspecified   PREGABALIN 300 MG CAPSULE 09/09/2020 30 60 unspecified   PREGABALIN 300 MG CAPSULE 08/08/2020 30 60 unspecified   PREGABALIN 300 MG CAPSULE 07/11/2020 30 60 unspecified   PREGABALIN 300 MG CAPSULE 06/12/2020 30 60 unspecified   PREGABALIN 100 MG CAPSULE 03/29/2020 90 270 unspecified   PREGABALIN 50 MG CAPSULE 03/29/2020 90 270 unspecified

## 2020-12-17 ENCOUNTER — Telehealth (INDEPENDENT_AMBULATORY_CARE_PROVIDER_SITE_OTHER): Payer: Self-pay | Admitting: Internal Medicine

## 2020-12-17 NOTE — Telephone Encounter (Signed)
Patient called with questions regarding his medications, he will like a call (A detail VM is ok) Dr.Kraft RN tomorrow 12/17/2020 ASAP.   Please advise, thank you.

## 2020-12-18 ENCOUNTER — Ambulatory Visit (HOSPITAL_BASED_OUTPATIENT_CLINIC_OR_DEPARTMENT_OTHER): Payer: Medicare PPO

## 2020-12-18 ENCOUNTER — Ambulatory Visit: Payer: Medicare PPO | Attending: Psychiatry | Admitting: Addiction (Substance Use Disorder)

## 2020-12-18 ENCOUNTER — Ambulatory Visit (INDEPENDENT_AMBULATORY_CARE_PROVIDER_SITE_OTHER): Payer: Medicare PPO | Admitting: Podiatrist

## 2020-12-18 DIAGNOSIS — F1010 Alcohol abuse, uncomplicated: Secondary | ICD-10-CM | POA: Insufficient documentation

## 2020-12-18 DIAGNOSIS — F1020 Alcohol dependence, uncomplicated: Secondary | ICD-10-CM | POA: Insufficient documentation

## 2020-12-18 DIAGNOSIS — L02619 Cutaneous abscess of unspecified foot: Secondary | ICD-10-CM

## 2020-12-18 DIAGNOSIS — L03119 Cellulitis of unspecified part of limb: Secondary | ICD-10-CM

## 2020-12-18 MED ORDER — NALTREXONE 380 MG IM SUSR
INTRAMUSCULAR | Status: AC
Start: 2020-12-18 — End: ?
  Filled 2020-12-18: qty 380

## 2020-12-18 NOTE — Progress Notes (Signed)
12/18/2020    Ronnie Mason is a 70 year old male here for naltrexone 380mg  injection. Last dose was 11/13/2020, was scheduled last week for follow up injection but was unable to come due to being hospitalized for an infection in his foot. Ronnie Mason reports that he has spoken with his provider and with his addiction counselor Ronnie Hua and they have decided that this will be his last naltrexone injection for now. He feels that he has reached his treatment goal and no longer wishes to continue with the medication. He will contact provider if he would like to restart treatment in the future.    Ronnie Mason reports that while he was in the hospital he was prescribed oxycodone 5mg  Q4H PRN, he has been taking about 1 5mg  tablet daily for the last week, last dose was Monday morning. RN discussed risk of giving naltrexone injection today with Dr.Merrill. Per Dr.Merrill patient is not at risk for withdrawal at this point due to him being naive to opiates and last dose 48 hours earlier.     Naltrexone 380mg  administered IM to right dorsogluteal. Patient tolerated well.

## 2020-12-18 NOTE — Progress Notes (Signed)
Post-Surgery Follow-Up SOAP Note    Patient: Ronnie Mason   Patient DOB: 02/20/1951     DOS:  12/18/2020     Accompanied by:  Spouse.    Subjective:   (history is obtained by MA/RN and edited by [name])  Procedure:    RIGHT foot surgery   PO week #:  2 week post op.  Date of surgery:  12/09/20    Dereke Neumann is a 70 year old year old male return today for routine post operative check.    This is 1st post operative visit.    The patient states improving with minor discomfort.    Patient relates has many questions, regarding pain medication, pain.       The patient states of mild and moderate pain.    The patient is compliant with post-operative instructions.    Currently instructed post-op weightbearing status is non weightbearing.    The patient denies fever/chill, SOB/CP and calf pain.    Patient denies of side effects of the prescribed medication.    Current post-op pain medication is Tylenol.    The pain need to be better control.  4-3 pain scale, gets worse when standing on it which he should not be standing.   DVT prophylaxis:  unknown.      He is on Naltrexone and oxycodone is not effective but the pain still manage per his wife who manages the pain.     PMH:   Past Medical History:   Diagnosis Date   . Pain of right hip joint 01/23/2016   . Intraparenchymal hemorrhage of brain (HCC) 2017   . 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 Allergic rhinitis due to allergen    . Carotid Sinus Hypersensitivity    . Disc disorder of lumbosacral region    . Dizziness    . Heart murmur    . Hip injury    . HYPERTENSION     . HYPOTENSION     . Nosebleed    . Strep throat    . Stroke (HCC)    . Syncope    . 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(HCC)    . URI (upper respiratory infection)         Review of patient's allergies indicates:  Allergies   Allergen Reactions   . Adhesives Skin: Rash   . Bee Venom Skin: Hives, Skin: Itching and Swelling   . Gabapentin      Flu like symptoms,  diarrhea, body ache upset stomach   . Methocarbamol Other     Patient's wife reports cognitive difficulties ("goofy")        Medications:   Outpatient Medications Prior to Visit   Medication Sig Dispense Refill   . Acetaminophen 500 MG Oral Tab Take 1,000 mg by mouth 2 times a day.     . Alpha-Lipoic Acid 300 MG tablet Take 1 tablet by mouth daily.     [name] amLODIPine 5 MG tablet TAKE ONE TABLET BY MOUTH TWICE DAILY 180 tablet 3   . atorvastatin 10 MG tablet Take 1 tablet (10 mg) by mouth daily. Restart 09/19/2020 90 tablet 1   . BENFOTIAMINE OR Take 250 mg by mouth daily.     . Cholecalciferol (VITAMIN D3) 2000 units Oral Cap Take 1 capsule (2,000 Units) by mouth daily. For low level 1 capsule 1   . Cyanocobalamin 1000 MCG Oral Tab one per day over-the-counter started 5/21 /2012 this level borderline  low 1 Tab 1   . diclofenac 1 % gel Apply 4 g topically 4 times a day. Apply to 2 gram four times daily to neck, trapezius muscle, and low back. Use a max of 32 grams a day. 300 g 4   . doxycycline hyclate 100 MG capsule Take 1 capsule (100 mg) by mouth 2 times a day for 10 days. 20 capsule 0   . DULoxetine 60 MG DR capsule TAKE 1 CAPSULE BY MOUTH TWICE DAILY 180 capsule 2   . EPINEPHrine 0.3 MG/0.3ML auto-injector Inject as instructed per patient package insert, 0.3 mg intramuscularly or subcutaneously into the thigh, if needed to treat anaphylaxis 4 each 1   . ibuprofen 200 MG tablet Take 200 mg by mouth daily as needed (pain).     [name] lidocaine 5 % patch Apply 1 patch onto the skin daily. Apply to painful area for up to 12 hours in a 24 hour period. (Patient taking differently: Apply 1 patch onto the skin daily as needed. Apply to painful area for up to 12 hours in a 24 hour period.) 30 patch 5   . lisinopril 20 MG tablet Take 1 tablet (20 mg) by mouth every 12 hours. 180 tablet 1   . naltrexone 50 MG tablet Take 1 tablet (50 mg) by mouth daily. 30 tablet 1   . pregabalin 300 MG capsule take 1 capsule by mouth twice a day  60 capsule 5   . primidone 50 MG tablet Take 1 tablet (50 mg) by mouth 2 times a day. 180 tablet 0     Facility-Administered Medications Prior to Visit   Medication Dose Route Frequency Provider Last Rate Last Admin   . naltrexone (Vivitrol) injection 380 mg  380 mg Intramuscular q21 days Rockne Menghini, MD   380 mg at 11/13/20 1010        Objective:   There were no vitals filed for this visit.   The skin splint complete broken for his noncompliance of NWB instruction.   The dressings are clean, dry and intact with mild dry blood.  The surgical sites were examined and the incision(s) has/have well approximate with no dehiscence and no erythema and no erythema.  There is mild pain on surgical site.  There is minimal swelling and is no erythema noted.  There is no signs of DVT i.e. no calf pain, swelling and redness.  There is no evidence of allodynia.          Assessment:   (L02.619) Abscess of foot  (primary encounter diagnosis)  (L03.119,  L02.619) Cellulitis and abscess of foot    The patient is making steady progress post-operatively. The patient appears to be NON COMPLIANT with Non weight bearing  regarding post-operative instruction.    Plan:      A discussion took place regarding post-operative progress.  Patient was instructed to call our office if they notice any significant change is post-operative progress and experience severe pain, swelling, fever/chill, calf pain, SOB.  Currently sutures are not ready to take off today and Steri strip was applied, sterile dressings: 4 x 4, Kerlix, and ace bandage.  Precaution were stressed today regards to level of activity/elevation/icing.   He was instructed to finish the oral abx as prescribed.     Surgical finding:  intraoperative findings were reviewed.      Ambulatory status:  The patient was instructed to continue non weight bearing using short leg cast and crutches and/or knee scooter.  DVT prophylaxis:  81 Aspirin twice a day (bid) and  TAPER OFF as patient start ambulating or regular ASPIRIN daily (qd) and TAPER OFF as patient start ambulating.      Patient instructed not drive a car.       Pain management:  The patient was instructed to use rest, ice, elevation, compression and NSAID as primary care for pain and swelling.  At this point we agree to use:  Tylenol and Ibuprofen.      Physical therapy:  none.        Return/Follow-up:  follow up in 1-2 weeks for post op check with our PA    [name] DPM, FACFAS  Reconstructive Ankle and Foot Surgeon  [facility]

## 2020-12-18 NOTE — Progress Notes (Signed)
Accompanied by:  patient was unaccompanied    Procedure:    RIGHT foot surgery   PO week #:  2 week post op.  Date of surgery:  12/09/20    Ronnie Mason is a 70 year old year old male return today for routine post operative check.    This is 1st post operative visit.    The patient states improving with minor discomfort.    Patient relates has many questions , regarding pain medication , pain.       The patient states of mild and moderate pain.    The patient is compliant with post-operative instructions.    Currently instructed post-op weightbearing status is non weightbearing.    The patient denies fever/chill, SOB/CP and calf pain.    Patient denies of side effects of the prescribed medication.    Current post-op pain medication is Tylenol.    The pain need to be better control.  4-3 pain scale, gets worse when standing on it  DVT prophylaxis:  unknown.

## 2020-12-18 NOTE — Progress Notes (Signed)
[facility]  1:1 Session Note    Problem #: 1,2,3,4   Time: 30 Minutes  Notes: D) Patient recently got MRSA in his foot. He's getting around on a scooter.He said that hes barely had a glass of wine since this occurred. This is likely to be my last appt with the patient. He is going to get his last vivitrol shot after this appt. We discussed staying as active as he can despite his foot issue - staying busy has helped him to drink less. Patient has oral naltrexone should urges to drink return. He was encouraged to give me a call should any issues arise. A) Patient appeared open and honest. P) Take meds as prescribed.              [name] M.A. CDP

## 2020-12-18 NOTE — Telephone Encounter (Signed)
Spoke with Ronnie Mason who has concern regarding medication interaction.     Ronnie Mason was prescribed oxyCODONE 5 MG tablet while also taking naltrexone 50 MG tablet     Ronnie Mason states they did not receive any pain relief and researched that there is a drug interaction between the two medications.     Ronnie Mason has been taking tyleneol and ibuprofen but not helping much.     Inquiring if PCP can provide a Rx for something that would not interact with naltrexone 50 MG tablet     Pended pharmacy.    Routing to PCP   DVM okay

## 2020-12-19 ENCOUNTER — Encounter (HOSPITAL_BASED_OUTPATIENT_CLINIC_OR_DEPARTMENT_OTHER): Payer: Self-pay

## 2020-12-19 ENCOUNTER — Telehealth (INDEPENDENT_AMBULATORY_CARE_PROVIDER_SITE_OTHER): Payer: Self-pay | Admitting: Podiatrist

## 2020-12-19 NOTE — Telephone Encounter (Signed)
Willmer called in wanting to talk to [name] team about the need for pain medication. He also cancelled some upcoming appointments and wants to discuss this with Diane.

## 2020-12-20 ENCOUNTER — Other Ambulatory Visit (INDEPENDENT_AMBULATORY_CARE_PROVIDER_SITE_OTHER): Payer: Self-pay | Admitting: Podiatrist

## 2020-12-20 ENCOUNTER — Ambulatory Visit (HOSPITAL_BASED_OUTPATIENT_CLINIC_OR_DEPARTMENT_OTHER): Payer: Medicare PPO | Admitting: Anesthesiology

## 2020-12-20 DIAGNOSIS — L02619 Cutaneous abscess of unspecified foot: Secondary | ICD-10-CM

## 2020-12-20 MED ORDER — MELOXICAM 15 MG OR TABS
15.0000 mg | ORAL_TABLET | Freq: Every day | ORAL | 1 refills | Status: DC
Start: 2020-12-20 — End: 2021-01-24

## 2020-12-20 NOTE — Telephone Encounter (Signed)
Sent meds to pharmacy  

## 2020-12-26 ENCOUNTER — Ambulatory Visit (INDEPENDENT_AMBULATORY_CARE_PROVIDER_SITE_OTHER): Payer: Medicare PPO | Admitting: Internal Medicine

## 2020-12-26 ENCOUNTER — Encounter (INDEPENDENT_AMBULATORY_CARE_PROVIDER_SITE_OTHER): Payer: Self-pay | Admitting: Internal Medicine

## 2020-12-26 VITALS — BP 128/82 | HR 75 | Temp 95.1°F

## 2020-12-26 DIAGNOSIS — E785 Hyperlipidemia, unspecified: Secondary | ICD-10-CM

## 2020-12-26 DIAGNOSIS — I10 Essential (primary) hypertension: Secondary | ICD-10-CM

## 2020-12-26 DIAGNOSIS — M792 Neuralgia and neuritis, unspecified: Secondary | ICD-10-CM

## 2020-12-26 DIAGNOSIS — Z1211 Encounter for screening for malignant neoplasm of colon: Secondary | ICD-10-CM

## 2020-12-26 DIAGNOSIS — F1010 Alcohol abuse, uncomplicated: Secondary | ICD-10-CM

## 2020-12-26 DIAGNOSIS — Z8679 Personal history of other diseases of the circulatory system: Secondary | ICD-10-CM

## 2020-12-26 DIAGNOSIS — Z1212 Encounter for screening for malignant neoplasm of rectum: Secondary | ICD-10-CM

## 2020-12-26 DIAGNOSIS — L02611 Cutaneous abscess of right foot: Secondary | ICD-10-CM

## 2020-12-26 DIAGNOSIS — G894 Chronic pain syndrome: Secondary | ICD-10-CM

## 2020-12-26 DIAGNOSIS — Z23 Encounter for immunization: Secondary | ICD-10-CM

## 2020-12-26 DIAGNOSIS — G250 Essential tremor: Secondary | ICD-10-CM

## 2020-12-26 DIAGNOSIS — Z13220 Encounter for screening for lipoid disorders: Secondary | ICD-10-CM

## 2020-12-26 DIAGNOSIS — M542 Cervicalgia: Secondary | ICD-10-CM

## 2020-12-26 MED ORDER — PREGABALIN 300 MG OR CAPS
300.0000 mg | ORAL_CAPSULE | Freq: Two times a day (BID) | ORAL | 5 refills | Status: DC
Start: 2020-12-26 — End: 2021-04-01

## 2020-12-26 MED ORDER — LISINOPRIL 20 MG OR TABS
20.0000 mg | ORAL_TABLET | Freq: Two times a day (BID) | ORAL | 1 refills | Status: DC
Start: 2020-12-26 — End: 2021-07-23

## 2020-12-26 MED ORDER — AMLODIPINE BESYLATE 5 MG OR TABS
5.0000 mg | ORAL_TABLET | Freq: Two times a day (BID) | ORAL | 3 refills | Status: DC
Start: 2020-12-26 — End: 2021-07-23

## 2020-12-26 MED ORDER — DULOXETINE HCL 60 MG OR CPEP
60.0000 mg | DELAYED_RELEASE_CAPSULE | Freq: Two times a day (BID) | ORAL | 2 refills | Status: DC
Start: 2020-12-26 — End: 2021-07-23

## 2020-12-26 MED ORDER — PRIMIDONE 50 MG OR TABS
50.0000 mg | ORAL_TABLET | Freq: Two times a day (BID) | ORAL | 4 refills | Status: DC
Start: 2020-12-26 — End: 2021-07-23

## 2020-12-26 NOTE — Patient Instructions (Signed)
Shingrix at pharmacy.

## 2020-12-26 NOTE — Progress Notes (Signed)
Ronnie Mason is a 70 year old male       BP 128/82   Pulse 75   Temp (!) 35.1 C   SpO2 96%   Chief Complaint   Patient presents with   . Hospital Follow-up       Patient seen with   Wife     He had surgery w DR Vista Lawman foot Mrsa   12/02/2020 amit    tcm  DATE OF ADMISSION: 12/02/2020  DATE OF DISCHARGE: 12/10/20  abcess - right foot     Phone 5/11 /2022    HPI  History of:    pcp Donnal Moat, MD          Patient Care Team:  Donnal Moat, MD as PCP - General (Internal Medicine)  Donnal Moat, MD (Internal Medicine)  Cleotis Nipper, MD as Cardiologist  (Cardiology)  Dennison Bulla, MD (Sports Medicine)  Fabiola Backer, DPM (Podiatry)  Temple Pacini, MD (Orthopedic Surgery)  Lavell Islam, MD (Gastroenterology)  Gerrianne Scale, MD (General Surgery)  Gayleen Orem, ARNP (Urology)  Wilmon Arms Dong-Hyun, DPM (Podiatry)  Art Buff, MD as Primary Contact (Pain Management)  Drenckpohl, Rica Mast, RN as Registered Nurse (Nursing)  Kirke Shaggy, MD as Surgeon (Otolaryngology)       Today follow-up above issues:     Patient is with his wife currently taking meloxicam for pain control by Dr. Maudie Mercury when he relies that the opioids were not working because he is taking naltrexone for his alcohol addiction   His wife notes that pregabalin is working for him as if he forgets he is not good the next day he is also okay with the duloxetine noted the same   Alcohol use he and his wife will share a bottle of wine now and then   He said that this foot started to become inflamed about 5 days prior to presentation when he bruised it shoveling dirt and it per got progressively worse   Although he denied poking at the wound his wife said he did   Antibiotics are done discomfort appears to be tolerable   Denies GI distress from the meloxicam      Hospital Encounter on 12/02/20   1. CBC with Diff   Result Value Ref Range    WBC 10.11 (H) 4.3 - 10.0 10*3/uL     RBC 4.23 (L) 4.40 - 5.60 10*6/uL    Hemoglobin 14.2 13.0 - 18.0 g/dL    Hematocrit 43 38.0 - 50.0 %    MCV 102 (H) 81 - 98 fL    MCH 33.6 27.3 - 33.6 pg    MCHC 32.9 32.2 - 36.5 g/dL    Platelet Count 209 150 - 400 10*3/uL    RDW-CV 13.3 11.6 - 14.4 %    % Neutrophils 70 %    % Lymphocytes 13 %    % Monocytes 11 %    % Eosinophils 5 %    % Basophils 1 %    % Immature Granulocytes 0 %    Neutrophils 6.97 1.80 - 7.00 10*3/uL    Absolute Lymphocyte Count 1.33 1.00 - 4.80 10*3/uL    Monocytes 1.14 (H) 0.00 - 0.80 10*3/uL    Absolute Eosinophil Count 0.54 (H) 0.00 - 0.50 10*3/uL    Basophils 0.09 0.00 - 0.20 10*3/uL    Immature Granulocytes 0.04 0.00 - 0.05 10*3/uL    Nucleated RBC 0.00 0.00 10*3/uL    %  Nucleated RBC 0 %   2. Comprehensive Metabolic Panel   Result Value Ref Range    Sodium 138 135 - 145 meq/L    Potassium 4.0 3.6 - 5.2 meq/L    Chloride 100 98 - 108 meq/L    Carbon Dioxide, Total 30 22 - 32 meq/L    Anion Gap 8 4 - 12    Glucose 100 62 - 125 mg/dL    Urea Nitrogen 32 (H) 8 - 21 mg/dL    Creatinine 1.06 0.51 - 1.18 mg/dL    Protein (Total) 6.9 6.0 - 8.2 g/dL    Albumin 3.8 3.5 - 5.2 g/dL    Bilirubin (Total) 0.4 0.2 - 1.3 mg/dL    Calcium 9.1 8.9 - 10.2 mg/dL    AST (GOT) 20 9 - 38 U/L    Alkaline Phosphatase (Total) 71 36 - 161 U/L    ALT (GPT) 15 10 - 48 U/L    eGFR by CKD-EPI >60 >59 mL/min/[1.73_m2]   3. SARS-CoV-2 (COVID-19) Qualitative Rapid PCR   Result Value Ref Range    COVID-19 Coronavirus Qual PCR Specimen Type Nasal swab     COVID-19 Coronavirus Qual PCR Result None detected NDET    COVID-19 Coronavirus Qual PCR Interpretation       This is a negative result. Laboratory testing alone cannot rule out infection, particularly in the presence of clinical risk factors such as symptoms or exposure history.    COVID-19 Qualitative PCR Indication Symptomatic    4. Lab Add On Order   Result Value Ref Range    Lab Test Requested ESR CRP     Specimen Type/Description Blood     Sample To Use Most recent      Test Request Status Order Processed    5. Erythrocyte Sedimentation Rate   Result Value Ref Range    Erythrocyte Sedimentation Rate 58 (H) 0 - 15 mm/h   6. C-Reactive Protein   Result Value Ref Range    C_Reactive Protein 215.8 (H) 0.0 - 10.0 mg/L   7. Basic Metabolic Panel   Result Value Ref Range    Sodium 137 135 - 145 meq/L    Potassium 4.1 3.6 - 5.2 meq/L    Chloride 99 98 - 108 meq/L    Carbon Dioxide, Total 30 22 - 32 meq/L    Anion Gap 8 4 - 12    Glucose 93 62 - 125 mg/dL    Urea Nitrogen 20 8 - 21 mg/dL    Creatinine 0.90 0.51 - 1.18 mg/dL    Calcium 8.9 8.9 - 10.2 mg/dL    eGFR by CKD-EPI >60 >59 mL/min/[1.73_m2]   8. Magnesium   Result Value Ref Range    Magnesium 1.6 (L) 1.8 - 2.4 mg/dL   9. Phosphate   Result Value Ref Range    Phosphate 2.6 2.5 - 4.5 mg/dL   10. CBC   Result Value Ref Range    WBC 8.54 4.3 - 10.0 10*3/uL    RBC 4.21 (L) 4.40 - 5.60 10*6/uL    Hemoglobin 14.1 13.0 - 18.0 g/dL    Hematocrit 43 38.0 - 50.0 %    MCV 103 (H) 81 - 98 fL    MCH 33.5 27.3 - 33.6 pg    MCHC 32.6 32.2 - 36.5 g/dL    Platelet Count 203 150 - 400 10*3/uL    RDW-CV 13.2 11.6 - 14.4 %   11. COVID-19 Coronavirus Qualitative PCR   Result Value Ref  Range    COVID-19 Coronavirus Qual PCR Specimen Type Nasal swab     COVID-19 Coronavirus Qual PCR Result None detected NDET    COVID-19 Coronavirus Qual PCR Interpretation       This is a negative result. Laboratory testing alone cannot rule out infection, particularly in the presence of clinical risk factors such as symptoms or exposure history.   12. Vancomycin, Trough Level   Result Value Ref Range    Vancomycin, Trough Level 12.6 5.0 - 20.0 ug/mL   13. CBC   Result Value Ref Range    WBC 8.30 4.3 - 10.0 10*3/uL    RBC 4.36 (L) 4.40 - 5.60 10*6/uL    Hemoglobin 14.4 13.0 - 18.0 g/dL    Hematocrit 43 38.0 - 50.0 %    MCV 100 (H) 81 - 98 fL    MCH 33.0 27.3 - 33.6 pg    MCHC 33.2 32.2 - 36.5 g/dL    Platelet Count 274 150 - 400 10*3/uL    RDW-CV 12.6 11.6 - 14.4 %   14.  Basic Metabolic Panel   Result Value Ref Range    Sodium 135 135 - 145 meq/L    Potassium 4.5 3.6 - 5.2 meq/L    Chloride 97 (L) 98 - 108 meq/L    Carbon Dioxide, Total 28 22 - 32 meq/L    Anion Gap 10 4 - 12    Glucose 142 (H) 62 - 125 mg/dL    Urea Nitrogen 18 8 - 21 mg/dL    Creatinine 0.87 0.51 - 1.18 mg/dL    Calcium 9.2 8.9 - 10.2 mg/dL    eGFR by CKD-EPI >60 >59 mL/min/[1.73_m2]   15. Lab  Add On Order, Microbiology   Result Value Ref Range    Lab Test Requested Fungal culture     Specimen Type/Description Wound     Sample To Use Right foot abscess, 05/04     Test Request Status Order Processed    16. CBC   Result Value Ref Range    WBC 6.49 4.3 - 10.0 10*3/uL    RBC 4.41 4.40 - 5.60 10*6/uL    Hemoglobin 14.7 13.0 - 18.0 g/dL    Hematocrit 45 38.0 - 50.0 %    MCV 101 (H) 81 - 98 fL    MCH 33.3 27.3 - 33.6 pg    MCHC 33.0 32.2 - 36.5 g/dL    Platelet Count 252 150 - 400 10*3/uL    RDW-CV 12.7 11.6 - 14.4 %   17. Basic Metabolic Panel   Result Value Ref Range    Sodium 140 135 - 145 meq/L    Potassium 3.7 3.6 - 5.2 meq/L    Chloride 100 98 - 108 meq/L    Carbon Dioxide, Total 31 22 - 32 meq/L    Anion Gap 9 4 - 12    Glucose 93 62 - 125 mg/dL    Urea Nitrogen 18 8 - 21 mg/dL    Creatinine 0.88 0.51 - 1.18 mg/dL    Calcium 9.4 8.9 - 10.2 mg/dL    eGFR by CKD-EPI >60 >59 mL/min/[1.73_m2]   18. Vancomycin, Trough Level   Result Value Ref Range    Vancomycin, Trough Level 14.6 5.0 - 20.0 ug/mL   19. COVID-19 Coronavirus Qualitative PCR   Result Value Ref Range    COVID-19 Coronavirus Qual PCR Specimen Type Nasal swab     COVID-19 Coronavirus Qual PCR Result None detected NDET  COVID-19 Coronavirus Qual PCR Interpretation       This is a negative result. Laboratory testing alone cannot rule out infection, particularly in the presence of clinical risk factors such as symptoms or exposure history.   20. Culture Body Fluid, Bacterial w/ Stain    Specimen: Foot, Right; Wound    Foot, Right~Abscess~eSwab   Result  Value Ref Range    Special Requests None     Microbiology Comment Specimen tracking    21. Blood Culture, Peripheral    Specimen: Blood    Arm, Left   Result Value Ref Range    Special Requests Aerobic and anaerobic bottles     Culture No growth 5 days    22. Blood Culture, Peripheral    Specimen: Blood    Hand, Right   Result Value Ref Range    Special Requests Aerobic and anaerobic bottles     Culture No growth 5 days    23. Wound C/S w/Gram (Anaerobic)    Specimen: Wound    Foot, Right~Abscess~eSwab   Result Value Ref Range    Special Requests Includes Aerobic screen     Gram Smear 2+  Polymorphonuclear cells       Gram Smear No squamous epithelial cells seen     Gram Smear 2+  Gram Positive Cocci   (A)     Culture (A)      3+  Staphylococcus aureus, coagulase positive  :  : Oxacillin (representing methicillin) resistance indicates resistance to cephalosporins, beta lactamase inhibitors, imipenem and all other beta lactams.  - For inpatients, isolate using contact precautions per institutional policy.  Contact Infection Control if you have any questions.      Culture       Telephoned with read back to:  RN Percell Locus K(4S) at [phone] on 12/05/20      Culture       Providers, see link for important guidelines regarding the interpretation of susceptibility reports: http://tests.labmed.http://www.walker-hill.info/       Susceptibility    Staphylococcus aureus, coagulase positive - MICROTITER MIC (MCG/ML)-SELECT     Clindamycin <=0.5 Susceptible      Daptomycin <=0.5 Susceptible      Erythromycin >4 Resistant      Levofloxacin >4 Resistant      Moxifloxacin 2 Resistant      Oxacillin >2 Resistant      Tetracycline <=2 Susceptible      Trimeth_Sulfamethoxazole >4 Resistant      Vancomycin 1 Susceptible    24. OR Bacterial C/S Aerobic/Anaerobic with Gram Stain    Specimen: Soft tissue, OTHER    Foot, Right~PLANTAL   Result Value Ref Range    Special Requests None     Microbiology Comment Specimen tracking    25. Culture Tissue, Bact w/  Stain    Specimen: Tissue    Foot, Right~PLANTAL   Result Value Ref Range    Special Requests Includes Anaerobic screen     Gram Smear 3+  Polymorphonuclear cells       Gram Smear No organisms seen     Culture (A)      2+  Staphylococcus aureus, coagulase positive  :  : Oxacillin (representing methicillin) resistance indicates resistance to cephalosporins, beta lactamase inhibitors, imipenem and all other beta lactams.  - For inpatients, isolate using contact precautions per institutional policy.  Contact Infection Control if you have any questions.      Culture - Critical/alert result previously called.     Culture  Providers, see link for important guidelines regarding the interpretation of susceptibility reports: http://tests.labmed.http://www.[facility].info/       Susceptibility    Staphylococcus aureus, coagulase positive - MICROTITER MIC (MCG/ML)-SELECT     Clindamycin <=0.5 Susceptible      Daptomycin <=0.5 Susceptible      Erythromycin >4 Resistant      Levofloxacin >4 Resistant      Moxifloxacin 2 Resistant      Oxacillin >2 Resistant      Tetracycline <=2 Susceptible      Trimeth_Sulfamethoxazole >4 Resistant      Vancomycin 1 Susceptible    26. Culture Wound, Fungal w/ Stain    Specimen: Wound    Foot, Right~Abscess~eSwab   Result Value Ref Range    Special Requests No special requests     Stain For Fungus No fungal elements seen     Culture No fungus isolated in 22 days        MRI Foot w wo Contrast Right    Result Date: 12/06/2020  EXAMINATION: MRI FOOT WO/W CONTRAST RIGHT CLINICAL INDICATION: R foot abcess s/p I&D on 5/3 on IV abx, evaluate for infectious tenosynovitis of flexor tendon Cellulitis and abscess of foot TECHNIQUE: MSK MR 61 Tumor - Osteomyelitis. No contraindications to MRI scanning were identified by the departmental screening protocol. Multi-planar, multi-sequence MR imaging was performed before and after the intravenous administration of gadolinium-based contrast. CONTRAST:  GADOTERIDOL  279.3 MG/ML IV SOLN,10 mmol Intravenous,12/05/2020 2212 COMPARISON: MRI right ankle dated 09/21/2018. FINDINGS: Moderately motion degraded examination. *  Postsurgical changes of recent incision and drainage for a reported foot abscess including an obliquely orientated soft tissue defect extending across the distal plantar midfoot and forefoot, roughly extending along a superficial line subjacent to the 1st [facility] joint to the 4th metatarsal head. *  There is enhancement and T2/STIR hyperintensity about the soft tissue defect that extends to involve the superficial muscular compartment. Small volume fluid within the flexor hallucis longus tendon sheath with a normal tendon signal and caliber (1201/28). *  No visualized measurable fluid collection to suggest residual abscess. *  1st MTP arthrodesis. Scattered midfoot OA.     Limited examination secondary to moderate motion degradation. 1.  Postsurgical changes of recent plantar foot wound incision and drainage. Minimal fluid within the flexor hallucis longus tendon sheath, likely related to recent surgical debridement. Normal tendon caliber and signal. 2.  No evident residual abscess. I have personally reviewed the images and agree with the report (or as edited).    XR Foot 3 View Right    Result Date: 12/03/2020  EXAMINATION: XR FOOT 3 VW RIGHT CLINICAL INDICATION: right foot pain COMPARISON: Right foot radiograph 08/08/2019. FINDINGS AND     As before, patient is status post arthrodesis of the first MTP joint. There is interval osseous modeling about the site of prior surgical hardware. 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 Remaining joint spaces are well-preserved. Scattered vascular calcifications. I have personally reviewed the images and agree with the report (or as edited).      MEDICATION PRIOR TO VISIT  Reports   Outpatient Medications Prior to Visit   Medication Sig Dispense Refill   . Acetaminophen 500 MG Oral Tab  Take 1,000 mg by mouth 2 times a day.     . Alpha-Lipoic Acid 300 MG tablet Take 1 tablet by mouth daily.     [name] amLODIPine 5 MG tablet TAKE ONE TABLET BY MOUTH TWICE DAILY 180  tablet 3   . atorvastatin 10 MG tablet Take 1 tablet (10 mg) by mouth daily. Restart 09/19/2020 90 tablet 1   . BENFOTIAMINE OR Take 250 mg by mouth daily.     . Cholecalciferol (VITAMIN D3) 2000 units Oral Cap Take 1 capsule (2,000 Units) by mouth daily. For low level 1 capsule 1   . Cyanocobalamin 1000 MCG Oral Tab one per day over-the-counter started 5/21 /2012 this level borderline low 1 Tab 1   . diclofenac 1 % gel Apply 4 g topically 4 times a day. Apply to 2 gram four times daily to neck, trapezius muscle, and low back. Use a max of 32 grams a day. 300 g 4   . DULoxetine 60 MG DR capsule TAKE 1 CAPSULE BY MOUTH TWICE DAILY 180 capsule 2   . EPINEPHrine 0.3 MG/0.3ML auto-injector Inject as instructed per patient package insert, 0.3 mg intramuscularly or subcutaneously into the thigh, if needed to treat anaphylaxis 4 each 1   . ibuprofen 200 MG tablet Take 200 mg by mouth daily as needed (pain).     [name] lidocaine 5 % patch Apply 1 patch onto the skin daily. Apply to painful area for up to 12 hours in a 24 hour period. (Patient taking differently: Apply 1 patch onto the skin daily as needed. Apply to painful area for up to 12 hours in a 24 hour period.) 30 patch 5   . lisinopril 20 MG tablet Take 1 tablet (20 mg) by mouth every 12 hours. 180 tablet 1   . meloxicam 15 MG tablet Take 1 tablet (15 mg) by mouth daily. 20 tablet 1   . pregabalin 300 MG capsule take 1 capsule by mouth twice a day 60 capsule 5   . primidone 50 MG tablet Take 1 tablet (50 mg) by mouth 2 times a day. 180 tablet 0     Facility-Administered Medications Prior to Visit   Medication Dose Route Frequency Provider Last Rate Last Admin   . 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    Diagnoses and all orders for this visit:    Neuropathic pain  -     pregabalin 300 MG capsule; Take 1 capsule (300 mg) by mouth 2 times a day.    Need for zoster vaccination    Screening for colorectal cancer    Chronic pain syndrome  -     DULoxetine 60 MG DR capsule; Take 1 capsule (60 mg) by mouth 2 times a day.    Cervicalgia  -     DULoxetine 60 MG DR capsule; Take 1 capsule (60  mg) by mouth 2 times a day.    H/O cerebral parenchymal hemorrhage  -     amLODIPine 5 MG tablet; Take 1 tablet (5 mg) by mouth 2 times a day.    Essential hypertension  -     amLODIPine 5 MG tablet; Take 1 tablet (5 mg) by mouth 2 times a day.  -     lisinopril 20 MG tablet; Take 1 tablet (20 mg) by mouth every 12 hours.    Essential tremor  -     primidone 50 MG tablet; Take 1 tablet (50 mg) by mouth 2 times a day.    Foot abscess, right    Hyperlipidemia, unspecified hyperlipidemia type  -     LIPID PANEL; Future          Patient has a history of neuropathic pain for which she is to remain on gabapentin and duloxetine and history of alcohol abuse for which she is on naltrexone    I believe that he has had 9 lives and he almost lost his foot when he ignored the foot swelling and he should never poke his foot with any type of substance or any part of his body again to drain any type  of infection    He has a history of renal insufficiency current kidney function test is normal 504    He is on currently meloxicam for pain control and I have no plans to put him on narcotics pain management at this point is being managed with anti-inflammatories      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 may be the alcohol I wonder does not help and in this particular  Instance he should have seek help much sooner as he could have lost his foot and almost died    rtc tin about  months as he will be under care of podiatry and addiction and pain clinic     Long term med for tremor continue     Antibiotics just ended for the right foot     Discussed hm due along with other issues above   Health Maintenance Topics with due status: Overdue       Topic Date Due    Zoster Vaccine because of Medicare he will get this at the pharmacy 07/22/2020    Colorectal Cancer Screening with recent events he is putting off his colonoscopy 09/24/2020    COVID-19 Vaccine he said he will asked Dr. Maudie Mercury if it is okay to proceed right now 11/09/2020     Health Maintenance Topics with due status: Due Soon       Topic Date Due    Lipid Disorders Screening 02/09/2021         No follow-ups on file.    Patient   express understanding of care plan/medications  Risk of taking medication properly and follow up discussed especially to call if problem    Created with the assistance of voice recognition software dictation          I spent a total of 40 minutes for the patient's care on the date of the service.

## 2021-01-01 ENCOUNTER — Ambulatory Visit (INDEPENDENT_AMBULATORY_CARE_PROVIDER_SITE_OTHER): Admit: 2021-01-01 | Discharge: 2021-01-01 | Disposition: A | Discharge status: Home / Self Care | Payer: Medicare PPO

## 2021-01-01 ENCOUNTER — Ambulatory Visit (INDEPENDENT_AMBULATORY_CARE_PROVIDER_SITE_OTHER): Payer: Medicare PPO | Admitting: Physician Assistant

## 2021-01-01 VITALS — BP 136/96 | HR 94

## 2021-01-01 DIAGNOSIS — Z981 Arthrodesis status: Secondary | ICD-10-CM

## 2021-01-01 DIAGNOSIS — L02619 Cutaneous abscess of unspecified foot: Secondary | ICD-10-CM

## 2021-01-01 NOTE — Progress Notes (Signed)
[name]  [id number]    POST-OP VISIT      PROCEDURES:  Closure, wound, foot, delayed V to Y skin advancement    DOS: 12/09/20    LATERALITY: RIGHT    PO WEEK #: 3 weeks, 2 days    SURGEON: Dr. Hilarie Fredrickson, DPM    Patient is accompanied to today's appointment by: spouse      SUBJECTIVE:  Ronnie Mason returns today for: suture removal. Overall, he has been feeling well since surgery.    Pain level:  2/10    Post-op pain medications & schedule: Acetaminophen prn   DVT prophylaxis:  none; has not started on asa   Weight-bearing status:  nwb in fiberglass cast; fiberglass cast integrity has been compromised. Bottom of cast is unstable and broken through.    Falls/mishaps:  none       ROS:  Constitutional: denies fatigue, fever, chills, headache  Respiratory: denies shortness of breath  Cardiovascular: denies chest pain, chest tightness  GI: denies nausea, vomiting, constipation, diarrhea   MSK: denies falls/mishaps, denies calf pain, groin pain/tenderness  Skin: denies rashes, itching, bleeding/discharge from suture sites       OBJECTIVE:  BP (!) 136/96    Pulse 94     Habitus: Well developed. Well nourished. There is no height or weight on file to calculate BMI.  Level of comfort: sitting up on exam table, in no apparent distress, alert and oriented to person, place and time.   Psych: appropriately sociable and reciprocal, normal mood, good eye contact  LOWER EXTREMITIES:   Vascular: warm and well perfused. Pedal pulses 2+. Toes blanch with pressure, capillary refill time brisk. +mild edema    Dermatological: normal texture/turgor. Incision sites are clear, dry and intact. There is no visual evidence of infection, wound dehiscence or drainage. +very small white drainage near distal heel incision. Incision has not yet achieved enough skin tension for suture removal. +mild abrasion over dorsal hallux   Neurologic: no hypersensitivity to light touch throughout bilateral lower extremities. No weakness with ankle ROM  in all planes. No pathological reflexes noted, negative for fasciculations. +baseline diminished sensation right lateral foot.   Musculoskeletal: no calf swelling or tenderness bilaterally. +Deconditioned muscle status present as expected.        ASSESSMENT:    3 weeks, 2 days s/p above procedure and overall doing well.     (L02.619) Abscess of foot  (primary encounter diagnosis)  Plan: XR Foot 3 View Right        PLAN:       Done today:      X-rays of right foot, 4 views: ap, lat, mo, lo.   Suture removal postponed for 2 more weeks.   Discussion on non-weightbearing restrictions.       Weight bearing status/ activity restrictions:     Weightbearing progression: nonweightbearing --> partial weighbearing in boot + crutches --> full weightbearing in boot --> full weightbearing in shoes      Surgical extremity placed in a fiberglass cast by Roe Coombs (pedorthist)     Physical therapy:    Not needed.      DVT prophylaxis:       Continue with lower extremity straight leg lifts, knee bends.   Continue to monitor for DVT symptoms: calf pain or swelling.         Pain management:    Continue to elevate foot at heart level and above if pain begins to increase.    Continue  to elevate as often as you can.   Continue to elevate even after you have transitioned into regular shoes. Swelling may be a cause of increased pain.  May use acetaminophen/ibuprofen for pain and swelling.  For acetaminophen: avoid consuming alcohol, do not take more than 4,000 mg in 24 hours.  For ibuprofen: take with food, do not take more than 2,400 mg in 24 hours  You should not use ibuprofen if you have:  A bleeding disorder, kidney disease, GI/stomach issues, liver disease.     Return/follow-up:  With me/[name] in 2 weeks for suture removal and transition into a boot.       All the patient's questions were answered. Communication was emphasized to the patient to call the office immediately if there is any worsening of their current  status.      Orders Placed This Encounter    XR Foot 3 View Right     4 views please: AP, LAT, MO, LO    PROCEDURES:  Closure, wound, foot, delayed V to Y skin advancement    DOS: 12/09/20     Standing Status:   Future     Number of Occurrences:   1     Standing Expiration Date:   01/31/2021     Order Specific Question:   Reason for exam (include signs, symptoms, diagnosis, and medical history)?     Answer:   3 weeks s/p right foot surgery       There are no Patient Instructions on file for this visit.        Oneta Rack, PA-C  Teaching Associate  Department of Orthopaedics and Sports Medicine  [facility]

## 2021-01-01 NOTE — Progress Notes (Signed)
Post op Right foot DOS   Discontinued ASA. NWB in cast        Triple antibiotic ointment applied over sutures prior to new cast placement.

## 2021-01-01 NOTE — Progress Notes (Deleted)
Recheck right foot pain DOS 12/02/20

## 2021-01-03 ENCOUNTER — Ambulatory Visit (HOSPITAL_BASED_OUTPATIENT_CLINIC_OR_DEPARTMENT_OTHER): Payer: Medicare PPO | Admitting: Anesthesiology

## 2021-01-06 LAB — WOUND FUNGAL W/DIRECT EXAM: Stain For Fungus: NONE SEEN

## 2021-01-08 ENCOUNTER — Ambulatory Visit (INDEPENDENT_AMBULATORY_CARE_PROVIDER_SITE_OTHER): Payer: Medicare PPO | Admitting: Podiatrist

## 2021-01-08 ENCOUNTER — Ambulatory Visit (INDEPENDENT_AMBULATORY_CARE_PROVIDER_SITE_OTHER): Payer: Medicare PPO

## 2021-01-08 DIAGNOSIS — L02619 Cutaneous abscess of unspecified foot: Secondary | ICD-10-CM

## 2021-01-08 NOTE — Progress Notes (Signed)
Ronnie Mason is here today to have his cast removed.  Please see [name] notes on this patient.  I removed his cast.  After that he was then seen by [name].  He will follow back with [name] on an as-needed basis.

## 2021-01-08 NOTE — Progress Notes (Signed)
Ronnie Mason      Post-Surgery Follow-Up SOAP Note    Patient: Ronnie Mason   Patient DOB: 07/09/1951     DOS:  01/08/2021     Accompanied by:  patient was unaccompanied    Subjective:   (history is obtained by MA/RN and edited by Dr. Selena Mason)  Procedure:  I &D followed by [facility] with rotation flap and V-Y skin advancement RIGHT  PO week #:  4.  Date of surgery:  12/09/20    Ronnie Mason is a 70 year old year old male return today for unscheduled visit - patient request for cast to be removed since it is bothering him..    The patient states discomfort related to the cast.    Patient relates no new problems/complaints.       The patient states of moderate pain.    The patient is NON-COMPLIANT with NWB.    Currently instructed post-op weightbearing status is non weightbearing.    The patient denies fever/chill, SOB/CP and calf pain.    Patient denies of side effects of the prescribed medication.    Current post-op pain medication is Tylenol and ibuprofen.    DVT prophylaxis:  ASA.      PMH:   Past Medical History:   Diagnosis Date   . Pain of right hip joint 01/23/2016   . Intraparenchymal hemorrhage of brain (HCC) 2017   . 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 Allergic rhinitis due to allergen    . Carotid Sinus Hypersensitivity    . Disc disorder of lumbosacral region    . Dizziness    . Heart murmur    . Hip injury    . HYPERTENSION     . HYPOTENSION     . Nosebleed    . Strep throat    . Stroke (HCC)    . Syncope    . 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(HCC)    . URI (upper respiratory infection)         Review of patient's allergies indicates:  Allergies   Allergen Reactions   . Adhesives Skin: Rash   . Bee Venom Skin: Hives, Skin: Itching and Swelling   . Gabapentin      Flu like symptoms, diarrhea, body ache upset stomach   . Methocarbamol Other     Patient's wife reports cognitive difficulties ("goofy")        Medications:   Outpatient Medications Prior to Visit   Medication Sig Dispense  Refill   . Acetaminophen 500 MG Oral Tab Take 1,000 mg by mouth 2 times a day.     . Alpha-Lipoic Acid 300 MG tablet Take 1 tablet by mouth daily.     [name] amLODIPine 5 MG tablet Take 1 tablet (5 mg) by mouth 2 times a day. 180 tablet 3   . atorvastatin 10 MG tablet Take 1 tablet (10 mg) by mouth daily. Restart 09/19/2020 90 tablet 1   . BENFOTIAMINE OR Take 250 mg by mouth daily.     . Cholecalciferol (VITAMIN D3) 2000 units Oral Cap Take 1 capsule (2,000 Units) by mouth daily. For low level 1 capsule 1   . Cyanocobalamin 1000 MCG Oral Tab one per day over-the-counter started 5/21 /2012 this level borderline low 1 Tab 1   . diclofenac 1 % gel Apply 4 g topically 4 times a day. Apply to 2 gram four times daily to neck, trapezius muscle, and low back. Use a max  of 32 grams a day. 300 g 4   . DULoxetine 60 MG DR capsule Take 1 capsule (60 mg) by mouth 2 times a day. 180 capsule 2   . EPINEPHrine 0.3 MG/0.3ML auto-injector Inject as instructed per patient package insert, 0.3 mg intramuscularly or subcutaneously into the thigh, if needed to treat anaphylaxis 4 each 1   . ibuprofen 200 MG tablet Take 200 mg by mouth daily as needed (pain).     [name] lidocaine 5 % patch Apply 1 patch onto the skin daily. Apply to painful area for up to 12 hours in a 24 hour period. (Patient taking differently: Apply 1 patch onto the skin daily as needed. Apply to painful area for up to 12 hours in a 24 hour period.) 30 patch 5   . lisinopril 20 MG tablet Take 1 tablet (20 mg) by mouth every 12 hours. 180 tablet 1   . meloxicam 15 MG tablet Take 1 tablet (15 mg) by mouth daily. 20 tablet 1   . pregabalin 300 MG capsule Take 1 capsule (300 mg) by mouth 2 times a day. 60 capsule 5   . primidone 50 MG tablet Take 1 tablet (50 mg) by mouth 2 times a day. 180 tablet 4     Facility-Administered Medications Prior to Visit   Medication Dose Route Frequency Provider Last Rate Last Admin   . naltrexone (Vivitrol) injection 380 mg  380 mg Intramuscular q21  days Rockne Menghini, MD   380 mg at 12/18/20 1450        Objective:   There were no vitals filed for this visit.     The surgical sites were examined and the incision(s) has/have well approximate with no dehiscence and no erythema.  There is mild-moderate pain on incision on lateral region of forefoot.  There is mild swelling noted.     There is no signs of DVT i.e. no calf pain, swelling and redness.  The fiber glass cast is fracture on entire plantar perimeter.      Assessment:    Post op 4 weeks to address (L02.619) Abscess of foot  (primary encounter diagnosis)    The patient is making steady progress post-operatively. The patient appears to be NON COMPLIANT with Non weight bearing  regarding post-operative instruction.    Plan:    A discussion took place regarding post-operative progress.  Patient was instructed to call our office if they notice any significant change is post-operative progress and experience severe pain, swelling, fever/chill, calf pain, SOB.  I decided to not remove the sutures today for more precautionary measure since he is noncompliant with NWB and abx ointment with 4 x 4 and ace bandage was applied and recommend daily dressing till next visit.  Precaution were stressed today regards to level of activity/elevation/icing.      Ambulatory status:  The patient was instructed to protective weight bearing using camboot/cast boot.       DVT prophylaxis:  At this point patient was instructed to TAPER OFF the ASA since patient is ambulatory/active.     Pain management:  The patient was instructed to use rest, ice, elevation, compression and NSAID as primary care for pain and swelling.  At this point we agree to use:  Tylenol and Ibuprofen.     Return/Follow-up:  follow up in 1-2 weeks for post op check and follow up for incision check/suture removal    Ronnie Mason DPM, FACFAS  Reconstructive Ankle and Foot Surgeon  [place]

## 2021-01-13 NOTE — Patient Instructions (Signed)
Your sutures (stitches) were removed today and replaced with Steri-strips.    Wound Care After Suture Removal  Sutures or stitches are typically removed after the surgical wound site has closed. Although your wound site has closed, it is still in the process of healing.    Wait another 3 days before showering.  Do not remove your steri-strips. Keep them on as long as you can.  Shower with the Steri-strips on. You do not need to remove or cover them. If they fall off after your shower replace them with new steri-strips.  Thoroughly dry your wound area after each shower.  Do not submerge your wound for 2 more weeks (no pools, lakes, baths, hot tubs, pedicures etc.)   Your incision site may have been covered in scabs or too swollen; to prevent you extra pain from digging around your incision site, your sutures were cut. Remove the suture remnants (blue thread) with tweezers after you have showered and can see the area more clearly.      When to call the clinic:    Please call us at: [phone] if you notice any of the following signs:  Increased soreness, pain, or tenderness at the operative lower extremity.  A red streak, increased redness, or puffiness near the wound.  White, yellowish, or bad smelling discharge from the wound.  Bleeding that can't be stopped by applying pressure.  Fever of 100.19F (38.0C) or higher.      Showering guidelines:     Most falls occur in the bathroom. Shower areas/the bathroom is an unsafe place for patients who are limited/restricted in their weightbearing abilities. When taking a shower:  If you have a walk-in/stand-in shower, place a plastic chair in the shower stall and shower while sitting in the chair.            Done today:       Prescribed amoxicillin-clavulanate 875-125 mg, take 1 tablet 2 times a day for 10 days for superficial incision infection.   Take until all gone.  Consider taking probiotics with antibiotics.  CBC with differential for concern of infection regarding  arch redness  Suture removal  Discussion on surgical site care.  May soak foot in 2 weeks.  Apply polysporin or petroleum jelly over incision sites.           Weight bearing status/ activity restrictions:    Weightbearing progression: nonweightbearing --> partial weighbearing in boot + crutches --> full weightbearing in boot --> full weightbearing in shoes   Starting today, gradually begin to wean out of cast boot into stable shoes with arch supports/orthotics.   Each day, slowly wean out of/spend less and less time in your boot.  Try putting your full weight in your shoes for small amounts of time (15-30 minutes) throughout the day. Increase your time weight-bearing in your shoes each day until you are able to go about your daily routine in your shoes.  Avoid using unstable shoes: flip-flops, slippers.  Do not walk barefoot. Use slippers with a supportive sole when walking around at home.  Take a break from orthotics due to pressure area on arch.  Continue gentle ankle ROM exercises (ankle alphabets)       Physical therapy:   Not needed.      DVT prophylaxis:      None; patient is ambulating.           Pain management:   Continue to elevate foot at heart level and above if pain begins  to increase.   Continue to elevate as often as you can.  Continue to elevate even after you have transitioned into regular shoes. Swelling may be a cause of increased pain.  May use acetaminophen/ibuprofen for pain and swelling.  For acetaminophen: avoid consuming alcohol, do not take more than 4,000 mg in 24 hours.  For ibuprofen: take with food, do not take more than 2,400 mg in 24 hours  You should not use ibuprofen if you have:  A bleeding disorder, kidney disease, GI/stomach issues, liver disease.     Return/follow-up:  With me/[name] in 2-3 weeks.    Send photo updates to [place] via mychart.

## 2021-01-13 NOTE — Progress Notes (Addendum)
[name]  [id number]    POST-OP VISIT      PROCEDURES:  Closure, wound, foot, delayed V to Y skin advancement    DOS: 12/09/20    LATERALITY: RIGHT    PO WEEK #: 5 weeks, 1 day    SURGEON: Dr. Hilarie Fredrickson, DPM    Patient is accompanied to today's appointment by: spouse      SUBJECTIVE:  Ronnie Mason returns today for: suture removal. Overall, he has been feeling well since surgery.    Pain level:  minimal to none   Post-op pain medications & schedule: Acetaminophen prn   DVT prophylaxis:  none; has not started on asa   Weight-bearing status: Protected wb in cast boot   Falls/mishaps:  none       ROS:  Constitutional: denies fatigue, fever, chills, headache  Respiratory: denies shortness of breath  Cardiovascular: denies chest pain, chest tightness  GI: denies nausea, vomiting, constipation, diarrhea   MSK: denies falls/mishaps, denies calf pain, groin pain/tenderness  Skin: denies rashes, itching, bleeding/discharge from suture sites       OBJECTIVE:  There were no vitals taken for this visit.    Habitus: Well developed. Well nourished. There is no height or weight on file to calculate BMI.  Level of comfort: sitting up on exam table, in no apparent distress, alert and oriented to person, place and time.   Psych: appropriately sociable and reciprocal, normal mood, good eye contact  LOWER EXTREMITIES:   Vascular: warm and well perfused. Pedal pulses 2+. Toes blanch with pressure, capillary refill time brisk. +mild edema    Dermatological: normal texture/turgor. Incision sites are clear, dry and intact. There is no visual evidence of infection, wound dehiscence or drainage. +circumferential erythema at right arch; no warmth, bleeding or drainage. +mild abrasion over dorsal hallux   Neurologic: no hypersensitivity to light touch throughout bilateral lower extremities. No weakness with ankle ROM in all planes. No pathological reflexes noted, negative for fasciculations. +baseline diminished sensation right  lateral foot.   Musculoskeletal: no calf swelling or tenderness bilaterally. +Deconditioned muscle status present as expected.        ASSESSMENT:    5 weeks, 1 day s/p above procedure and overall doing well.     (L02.619) Abscess of foot  (primary encounter diagnosis)        PLAN:       Done today:       Prescribed amoxicillin-clavulanate 875-125 mg, take 1 tablet 2 times a day for 10 days for superficial incision infection.   Take until all gone.  Consider taking probiotics with antibiotics.  CBC with differential and CRP for concern of infection regarding arch redness  Suture removal  Discussion on surgical site care.  May soak foot in 2 weeks.  Apply polysporin or petroleum jelly over incision sites.           Weight bearing status/ activity restrictions:    . Weightbearing progression: nonweightbearing --> partial weighbearing in boot + crutches --> full weightbearing in boot --> full weightbearing in shoes   Starting today, gradually begin to wean out of cast boot into stable shoes; take a break from orthotics.  Each day, slowly wean out of/spend less and less time in your boot.  Try putting your full weight in your shoes for small amounts of time (15-30 minutes) throughout the day. Increase your time weight-bearing in your shoes each day until you are able to go about your daily routine in your  shoes.  Avoid using unstable shoes: flip-flops, slippers.  Do not walk barefoot. Use slippers with a supportive sole when walking around at home.  Take a break from orthotics due to pressure area on arch.  Continue gentle ankle ROM exercises (ankle alphabets)       Physical therapy:   . Not needed.      DVT prophylaxis:      . None; patient is ambulating.           Pain management:   . Continue to elevate foot at heart level and above if pain begins to increase.   . Continue to elevate as often as you can.  . Continue to elevate even after you have transitioned into regular shoes. Swelling may be a cause of increased  pain.  May use acetaminophen/ibuprofen for pain and swelling.  For acetaminophen: avoid consuming alcohol, do not take more than 4,000 mg in 24 hours.  For ibuprofen: take with food, do not take more than 2,400 mg in 24 hours  You should not use ibuprofen if you have:  A bleeding disorder, kidney disease, GI/stomach issues, liver disease.     Return/follow-up:  With me/[name] in 2-3 weeks.    Send photo updates to [place] via mychart.       All the patient's questions were answered. Communication was emphasized to the patient to call the office immediately if there is any worsening of their current status.      No orders of the defined types were placed in this encounter.      There are no Patient Instructions on file for this visit.        Oneta Rack, PA-C  Teaching Associate  Department of Orthopaedics and Sports Medicine  [facility]

## 2021-01-15 ENCOUNTER — Ambulatory Visit (INDEPENDENT_AMBULATORY_CARE_PROVIDER_SITE_OTHER): Payer: Medicare PPO | Admitting: Physician Assistant

## 2021-01-15 ENCOUNTER — Other Ambulatory Visit (INDEPENDENT_AMBULATORY_CARE_PROVIDER_SITE_OTHER): Payer: Self-pay | Admitting: Internal Medicine

## 2021-01-15 ENCOUNTER — Ambulatory Visit: Payer: Medicare PPO | Attending: Physician Assistant

## 2021-01-15 ENCOUNTER — Other Ambulatory Visit (INDEPENDENT_AMBULATORY_CARE_PROVIDER_SITE_OTHER): Payer: Self-pay | Admitting: Physician Assistant

## 2021-01-15 VITALS — BP 136/90 | HR 85

## 2021-01-15 DIAGNOSIS — L02619 Cutaneous abscess of unspecified foot: Secondary | ICD-10-CM

## 2021-01-15 DIAGNOSIS — E785 Hyperlipidemia, unspecified: Secondary | ICD-10-CM

## 2021-01-15 LAB — CBC, DIFF
% Basophils: 1 %
% Eosinophils: 7 %
% Immature Granulocytes: 0 %
% Lymphocytes: 18 %
% Monocytes: 9 %
% Neutrophils: 65 %
% Nucleated RBC: 0 %
Absolute Eosinophil Count: 0.52 10*3/uL — ABNORMAL HIGH (ref 0.00–0.50)
Absolute Lymphocyte Count: 1.46 10*3/uL (ref 1.00–4.80)
Basophils: 0.06 10*3/uL (ref 0.00–0.20)
Hematocrit: 47 % (ref 38.0–50.0)
Hemoglobin: 15.1 g/dL (ref 13.0–18.0)
Immature Granulocytes: 0.02 10*3/uL (ref 0.00–0.05)
MCH: 32.8 pg (ref 27.3–33.6)
MCHC: 32 g/dL — ABNORMAL LOW (ref 32.2–36.5)
MCV: 103 fL — ABNORMAL HIGH (ref 81–98)
Monocytes: 0.72 10*3/uL (ref 0.00–0.80)
Neutrophils: 5.23 10*3/uL (ref 1.80–7.00)
Nucleated RBC: 0 10*3/uL
Platelet Count: 274 10*3/uL (ref 150–400)
RBC: 4.6 10*6/uL (ref 4.40–5.60)
RDW-CV: 13.6 % (ref 11.6–14.4)
WBC: 8.01 10*3/uL (ref 4.3–10.0)

## 2021-01-15 LAB — LIPID PANEL
Cholesterol (LDL): 68 mg/dL (ref <130)
Cholesterol/HDL Ratio: 2
HDL Cholesterol: 103 mg/dL (ref >39)
Non-HDL Cholesterol: 99 mg/dL (ref 0–159)
Total Cholesterol: 202 mg/dL — ABNORMAL HIGH (ref <200)
Triglyceride: 154 mg/dL — ABNORMAL HIGH (ref <150)

## 2021-01-15 LAB — C_REACTIVE PROTEIN: C_Reactive Protein: 13.5 mg/L — ABNORMAL HIGH (ref 0.0–10.0)

## 2021-01-15 MED ORDER — AMOXICILLIN-POT CLAVULANATE 875-125 MG OR TABS
875.0000 mg | ORAL_TABLET | Freq: Two times a day (BID) | ORAL | 0 refills | Status: DC
Start: 2021-01-15 — End: 2021-07-23

## 2021-01-15 MED ORDER — IOHEXOL 240 MG/ML IJ SOLN
10.0000 mL | Freq: Once | INTRAMUSCULAR | Status: DC
Start: 2021-01-10 — End: 2021-10-22

## 2021-01-15 NOTE — Progress Notes (Signed)
Post op R foot.   WB tolerated in boot. Will have some discomfort with pressure.

## 2021-01-15 NOTE — Progress Notes (Unsigned)
DIAGNOSTIC NERVE BLOCK OF LUMBAR FACET JOINTS #1    PROCEDURE  Lumbar Medial Branch blocks targeting the left L4-5, L5-S1 lumbar facet joints     PRE-PROCEDURE DIAGNOSIS  left lumbar facet arthropathy    POST-PROCEDURE DIAGNOSIS  left lumbar facet arthropathy    INDICATION FOR INJECTION  [name] has moderate to severe axial pain which impacts his  function and quality of life and has been present for more than 3 months.  He has had reasonable conservative treatment efforts including physical therapy and medication management as outlined in Dr. Nathaniel Man note dated 11/22/20.  His physical examination is consistent with a facet source for his pain.   If two injections on two different sessions lead to substantial pain relief and improvement in function, we would conclude that the tested joints are likely a relevant source of his pain. In this case, radiofrequency ablation of the nerves could be offered as a treatment.    ATTENDING PHYSICIAN: [name], MD    FELLOW / RESIDENT: Alvester Chou, MD        SEDATION:  {.:106422::"NO"}    NPO STATUS:  {.:106422::"NO"}    IMAGING TECHNIQUE: Multiplanar fluoroscopic guidance was used for this procedure, appropriate images were saved.     TIME OUT/FINAL VERIFICATION: performed and documented.    POSITION:  Prone    PREP:  ChloraPrep and sterile drapes.  Sterile technique used throughout.     DESCRIPTION OF PROCEDURE    {RIGHT/LEFT:104821} L4, L5 (medial branch L3, L4)  Imaging for needle placement: An anterior-posterior fluoroscopic image allowed symmetric visualization of the upper and lower endplates; then the beam was tilted oblique to visualize the junction between transverse process and articular process at L4 and L5. Lidocaine 1% was used to anesthetize the skin at each level using a 25 G 2.5 inch needle.   Procedure needle used: ***25G, ***3.5 inch.  Needle insertion: Down the beam.  Imaging conditions: ***Good visualization of the target bony  structures.  Procedure needle positioned inferior and lateral to the intersection of the transverse process and pedicle at L4 & L5.  Injection of ***ml of {KAP LOCAL:112480} at each level.  ***Uneventful removal of the needle.    {RIGHT/LEFT:104821}  S1 (dorsal ramus L5)  Imaging for needle placement: An anterior-posterior fluoroscopic image allowed symmetric visualization of the upper and lower endplates; then the beam was tilted oblique to visualize the sacral alar Jerzi Tigert. Lidocaine 1% was used to anesthetize the skin at the injection site using a 25G 2.5 inch needle.  Procedure needle used: ***25 G, 3.5 inch.  Needle insertion: Down the beam.  Imaging conditions: ***Good visualization of the target bony structures  Procedure needle positioned over sacral alar Aleyah Balik.  Injection of ***ml of {KAP LOCAL:112480} at each level.  ***Uneventful removal of the needle.    COMPLICATIONS:   ***None.    FLUOROSCOPY TIME: ***sec    POST-PROCEDURE:    He was escorted to the recovery area in stable condition where he  made an ***uneventful recovery.    [name] was reassessed after the procedure.  The pain score was  {Numbers 0-10:107815}/10 before the procedure and {Numbers 0-10:107815}/10 prior to discharge (0 = no pain, 10 = worst pain imaginable).  [name] ***did note initial improvement in pain, including pain with movement.     PLAN  [name]  was instructed to keep his  pain diary and report the results of this procedure.  Subsequent care will be based on the results  of today's procedure.

## 2021-01-15 NOTE — Result Encounter Note (Signed)
The cholesterol looks much better and acceptable on atorvastatin stay on this medication recheck cholesterol yearly

## 2021-01-17 ENCOUNTER — Ambulatory Visit: Payer: Medicare PPO | Attending: Anesthesiology | Admitting: Anesthesiology

## 2021-01-17 DIAGNOSIS — G8929 Other chronic pain: Secondary | ICD-10-CM

## 2021-01-17 DIAGNOSIS — M545 Low back pain, unspecified: Secondary | ICD-10-CM

## 2021-01-17 DIAGNOSIS — M47816 Spondylosis without myelopathy or radiculopathy, lumbar region: Secondary | ICD-10-CM

## 2021-01-17 NOTE — Progress Notes (Signed)
This encounter was opened in error.    Patient scheduled for procedure but not performing procedure due to recent history of MRSA infection. Will reschedule to be last case of day.

## 2021-01-22 ENCOUNTER — Telehealth (HOSPITAL_BASED_OUTPATIENT_CLINIC_OR_DEPARTMENT_OTHER): Payer: Self-pay

## 2021-01-22 NOTE — Telephone Encounter (Signed)
Pt returned call.  He states he is taking his current Augmentin 10 day prescription and should finish on 6/25 or 6/26.  Notified him that Dr. Araceli Bouche will cancel the procedure on 01/24/21 and have him reschedule.  Transferred pt to [facility] for rescheduling his procedure Lumbar MBB.

## 2021-01-22 NOTE — Progress Notes (Deleted)
DIAGNOSTIC NERVE BLOCK OF LUMBAR FACET JOINTS #1    PROCEDURE  Lumbar Medial Branch blocks targeting the left L4-L5, L5-S1 lumbar facet joints     PRE-PROCEDURE DIAGNOSIS  left lumbar facet arthropathy    POST-PROCEDURE DIAGNOSIS  left lumbar facet arthropathy    INDICATION FOR INJECTION  [name] has moderate to severe axial pain which impacts his  function and quality of life and has been present for more than 3 months.  He has had reasonable conservative treatment efforts including physical therapy and medication management.  His imaging does not reveal another source of pain and is consistent with a facet source for his pain. His physical examination is  consistent with a facet source for his pain.   If two injections on two different sessions lead to substantial pain relief and improvement in function, we would conclude that the tested joints are likely a relevant source of his pain. In this case, radiofrequency ablation of the nerves could be offered as a treatment.    ATTENDING PHYSICIAN: [name], MD    FELLOW / RESIDENT: ***        SEDATION:  NO    NPO STATUS:  NO    IMAGING TECHNIQUE: Multiplanar fluoroscopic guidance was used for this procedure, appropriate images were saved.     TIME OUT/FINAL VERIFICATION: performed and documented.    POSITION:  Prone    PREP:  ChloraPrep and sterile drapes.  Sterile technique used throughout.     DESCRIPTION OF PROCEDURE    left L4, L5 (medial branch L3, L4)  Imaging for needle placement: An anterior-posterior fluoroscopic image allowed symmetric visualization of the upper and lower endplates; then the beam was tilted oblique to visualize the junction between transverse process and articular process at L4 and L5. Lidocaine 1% was used to anesthetize the skin at each level using a 25 G 2.5 inch needle.   Procedure needle used: ***25G, ***3.5 inch.  Needle insertion: Down the beam.  Imaging conditions: ***Good visualization of the target bony  structures.  Procedure needle positioned inferior and lateral to the intersection of the transverse process and pedicle at L4 & L5.  Injection of ***ml of Bupivicaine 0.5% at each level.  ***Uneventful removal of the needle.    left  S1 (dorsal ramus L5)  Imaging for needle placement: An anterior-posterior fluoroscopic image allowed symmetric visualization of the upper and lower endplates; then the beam was tilted oblique to visualize the sacral alar notch. Lidocaine 1% was used to anesthetize the skin at the injection site using a 25G 2.5 inch needle.  Procedure needle used: ***25 G, 3.5 inch.  Needle insertion: Down the beam.  Imaging conditions: ***Good visualization of the target bony structures  Procedure needle positioned over sacral alar notch.  Injection of ***ml of Bupivicaine 0.5% at each level.  ***Uneventful removal of the needle.    COMPLICATIONS:   ***None.    FLUOROSCOPY TIME: ***sec    POST-PROCEDURE:    He was escorted to the recovery area in stable condition where he  made an ***uneventful recovery.    [name] was reassessed after the procedure.  The pain score was  {Numbers 0-10:107815}/10 before the procedure and {Numbers 0-10:107815}/10 prior to discharge (0 = no pain, 10 = worst pain imaginable).  [name] ***did note initial improvement in pain, including pain with movement.     PLAN  [name]  was instructed to keep his  pain diary and report the results of this  procedure.  Subsequent care will be based on the results of today's procedure.

## 2021-01-22 NOTE — Telephone Encounter (Signed)
Called Ronnie Mason and LVM to return our call as Dr. Araceli Bouche would like to know what is antibiotic plan is as it looks like he was prescribed amoxicillin-clavulanate 875-125 mg, take 1 tablet 2 times a day for 10 days for superficial incision infection by Ernestina Patches Magbalot PA-C.  Ronnie Mason is currently scheduled for Lumbar MBB on 01/24/21 and if Ronnie Mason is on ABX on 6/24 which we anticipate he will be then we will need to cancel his procedure on 6/24 and try to reschedule for the following week.

## 2021-01-23 ENCOUNTER — Other Ambulatory Visit (INDEPENDENT_AMBULATORY_CARE_PROVIDER_SITE_OTHER): Payer: Self-pay | Admitting: Podiatrist

## 2021-01-23 DIAGNOSIS — L02619 Cutaneous abscess of unspecified foot: Secondary | ICD-10-CM

## 2021-01-24 ENCOUNTER — Encounter (HOSPITAL_BASED_OUTPATIENT_CLINIC_OR_DEPARTMENT_OTHER): Payer: Self-pay

## 2021-01-24 ENCOUNTER — Ambulatory Visit (HOSPITAL_BASED_OUTPATIENT_CLINIC_OR_DEPARTMENT_OTHER): Payer: Medicare PPO | Admitting: Anesthesiology

## 2021-01-24 MED ORDER — MELOXICAM 15 MG OR TABS
ORAL_TABLET | ORAL | 1 refills | Status: DC
Start: 2021-01-24 — End: 2021-07-23

## 2021-02-05 ENCOUNTER — Ambulatory Visit (INDEPENDENT_AMBULATORY_CARE_PROVIDER_SITE_OTHER): Admit: 2021-02-05 | Discharge: 2021-02-05 | Disposition: A | Discharge status: Home / Self Care | Payer: Medicare PPO

## 2021-02-05 ENCOUNTER — Ambulatory Visit (INDEPENDENT_AMBULATORY_CARE_PROVIDER_SITE_OTHER): Payer: Medicare PPO | Admitting: Family Medicine

## 2021-02-05 ENCOUNTER — Encounter (HOSPITAL_BASED_OUTPATIENT_CLINIC_OR_DEPARTMENT_OTHER): Payer: Self-pay | Admitting: Anesthesiology

## 2021-02-05 ENCOUNTER — Telehealth (INDEPENDENT_AMBULATORY_CARE_PROVIDER_SITE_OTHER): Payer: Self-pay

## 2021-02-05 VITALS — BP 148/80 | HR 58 | Temp 97.8°F | Resp 18 | Ht 76.0 in | Wt 230.0 lb

## 2021-02-05 DIAGNOSIS — M545 Low back pain, unspecified: Secondary | ICD-10-CM

## 2021-02-05 DIAGNOSIS — W19XXXA Unspecified fall, initial encounter: Secondary | ICD-10-CM

## 2021-02-05 DIAGNOSIS — G8929 Other chronic pain: Secondary | ICD-10-CM

## 2021-02-05 DIAGNOSIS — R109 Unspecified abdominal pain: Secondary | ICD-10-CM

## 2021-02-05 DIAGNOSIS — R0781 Pleurodynia: Secondary | ICD-10-CM

## 2021-02-05 MED ORDER — HYDROCODONE-ACETAMINOPHEN 5-325 MG OR TABS
1.0000 | ORAL_TABLET | Freq: Three times a day (TID) | ORAL | 0 refills | Status: DC | PRN
Start: 2021-02-05 — End: 2021-02-07

## 2021-02-05 NOTE — Progress Notes (Signed)
Mariel Aloe, MD  [facility]  Family Medicine  [address]  [place], [place]  [address]    [facility]  OUTPATIENT OFFICE VISIT  02/05/2021    SUBJECTIVE:     Ronnie Mason is a 70 year old male who presents today along with his wife with the following concerns:   Chief Complaint   Patient presents with    Genia Hotter 02/03/21 in shower w/ R hip/back & rib/flank pain & bruising       Pt c/o bruising and rib pain after a fall the morning of 02/03/21. Did not hit his head and no LOC.     He describes showering, using conditioner and turning to rinse while holding on to a grab bar. He was on a shower mat, but slipped with both his feet going out from under him, his left arm sliding down from the grab bar and resulting in a bruise on the underside of his arm. He hit the bench with his right low back and has a large deep bruise to mid way up his side. His ribs are hurting on the right side with deep breaths.     He has bruising on his back and L upper arm.     Pain with coughing and breathing. Rib fx's?     He has chronic back pain. He sees [facility] for chronic pain. He has been overall sore and having difficulty moving from sit to a stand. He is sleeping on his left side mostly comfortably. He has been taking tylenol and ibuprofen    Review of Systems - no fevers or hematuria.    ALLERGIES  Review of patient's allergies indicates:  Allergies   Allergen Reactions    Adhesives Skin: Rash     Glue on lido patches    Bee Venom Skin: Hives, Skin: Itching and Swelling    Gabapentin Other     Flu like symptoms, diarrhea, body ache upset stomach    Methocarbamol Other     Patient's wife reports cognitive difficulties ("goofy")       PROBLEM LIST  Patient Active Problem List   Diagnosis    Syncope    Hypotension    Carotid Sinus Hypersensitivity    URI (upper respiratory infection)    Cervicalgia    Hyperlipidemia    Chronic pain    Bee sting allergy    Numbness and tingling of leg    Chronic back pain     B12 deficiency    Chronic renal insufficiency, stage III (moderate) (HCC)    Back pain, lumbosacral    Lumbar radiculopathy, chronic    Neck pain    Fall    Tremor    Sensory neuropathy    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 myelopathy or radiculopathy    Idiopathic peripheral neuropathy    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)    History of alcohol dependence (HCC)    Cerebral ventriculomegaly    Cerebellar hypoplasia (HCC)    Essential hypertension    Essential tremor    Hx of spontan intraparenchymal intracran bleed assoc with hypertension    Alcohol abuse    Intraparenchymal hemorrhage of brain (HCC)    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)    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)    Balance problem    Chronic pain of both shoulders    Vitamin D deficiency    Difficulty in walking, not  elsewhere classified    Impaired mobility and ADLs    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)    Primary osteoarthritis of right hip    Mild vascular neurocognitive disorder (HCC)    Right lumbar radiculitis    Greater trochanteric pain syndrome of right lower extremity    Complex tear of medial meniscus of left knee as current injury    Primary osteoarthritis of left knee    Acute left-sided low back pain without sciatica    Chronic left-sided low back pain without sciatica    Enuresis    Closed fracture of one rib of right side    MRSA (methicillin resistant staph aureus) culture positive    Myofascial pain    Macrocytosis without anaemia    Cellulitis and abscess of foot        CURRENT MEDICATIONS    Current Outpatient Medications   Medication Sig Dispense Refill    Acetaminophen 500 MG Oral Tab Take 1,000 mg by mouth 2 times a day.      Alpha-Lipoic Acid 300 MG tablet Take 1 tablet by mouth daily.      amLODIPine 5 MG tablet Take 1 tablet (5 mg) by mouth 2 times a day. 180 tablet 3    amoxicillin-clavulanate 875-125 MG tablet Take 1 tablet (875 mg) by  mouth 2 times a day. Take 1 tablet (875 mg) by mouth 2 times a day for 10 days. 20 tablet 0    atorvastatin 10 MG tablet Take 1 tablet (10 mg) by mouth daily. Restart 09/19/2020 90 tablet 1    BENFOTIAMINE OR Take 250 mg by mouth daily.      Cholecalciferol (VITAMIN D3) 2000 units Oral Cap Take 1 capsule (2,000 Units) by mouth daily. For low level 1 capsule 1    Cyanocobalamin 1000 MCG Oral Tab one per day over-the-counter started 5/21 /2012 this level borderline low 1 Tab 1    diclofenac 1 % gel Apply 4 g topically 4 times a day. Apply to 2 gram four times daily to neck, trapezius muscle, and low back. Use a max of 32 grams a day. 300 g 4    DULoxetine 60 MG DR capsule Take 1 capsule (60 mg) by mouth 2 times a day. 180 capsule 2    EPINEPHrine 0.3 MG/0.3ML auto-injector Inject as instructed per patient package insert, 0.3 mg intramuscularly or subcutaneously into the thigh, if needed to treat anaphylaxis 4 each 1    ibuprofen 200 MG tablet Take 200 mg by mouth daily as needed (pain).      lidocaine 5 % patch Apply 1 patch onto the skin daily. Apply to painful area for up to 12 hours in a 24 hour period. (Patient taking differently: Apply 1 patch onto the skin daily as needed. Apply to painful area for up to 12 hours in a 24 hour period.) 30 patch 5    lisinopril 20 MG tablet Take 1 tablet (20 mg) by mouth every 12 hours. 180 tablet 1    meloxicam 15 MG tablet take 1 tablet by mouth once daily 20 tablet 1    Omega-3 Fatty Acids (OMEGA 3 OR) Take 1 tablet by mouth daily.      pregabalin 300 MG capsule Take 1 capsule (300 mg) by mouth 2 times a day. 60 capsule 5    primidone 50 MG tablet Take 1 tablet (50 mg) by mouth 2 times a day. 180 tablet 4     Current Facility-Administered Medications   Medication  Dose Route Frequency Provider Last Rate Last Admin    iohexol (Omnipaque) 240 MG/ML contrast injection 10 mL  10 mL See Admin Instructions Once Alvester Chou, MD        naltrexone (Vivitrol) injection 380  mg  380 mg Intramuscular q21 days Rockne Menghini, MD   380 mg at 12/18/20 1450        Past Medical History:   Diagnosis Date    Pain of right hip joint 01/23/2016    Intraparenchymal hemorrhage of brain (HCC) 2017    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 myelopathy or radiculopathy 05/28/2014    Allergic rhinitis due to allergen     Carotid Sinus Hypersensitivity     Disc disorder of lumbosacral region     Dizziness     Heart murmur     Hip injury     HYPERTENSION      HYPOTENSION      Nosebleed     Strep throat     Stroke [facility])     Syncope     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(HCC)     URI (upper respiratory infection)        Past Surgical History:   Procedure Laterality Date    L meniciscus Left 07/2017    by Dr Marcelle Overlie , re did left menisicus     PR ANES; COLONOSCOPY  2002    repeat in 3 years    PR ANES; COLONOSCOPY & POLYPECTOMY  11/18/2007    repeat in 3 years    PR HALLUX RIGIDUS W/CHEILECTOMY 1ST MP JT W/O IMPLT Right 10/21/2017    Dr. Donn Pierini    PR UNLISTED PROCEDURE FEMUR/KNEE      PR UNLISTED PROCEDURE HANDS/FINGERS      PR UNLISTED PROCEDURE SPINE  2013    rfa to c3 to c 7     TOE SURGERY Right 02/09/2019    toe surgery  Right 01/12/2019    Dr Hilarie Fredrickson        Social History     Socioeconomic History    Marital status: Married   Tobacco Use    Smoking status: Never Smoker    Smokeless tobacco: Former Neurosurgeon   Substance and Sexual Activity    Alcohol use: Yes     Alcohol/week: 8.0 - 12.0 standard drinks     Types: 8 - 12 Standard drinks or equivalent per week     Comment: drinks a night max    Drug use: Yes     Types: Marijuana     Comment: CBD and THC   Social History Narrative    09/06/18- married 41 years, 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  Non-smoker, 14 or more drinks/week almost exclusively wine with dinner.              OBJECTIVE:   BP (!) 148/80    Pulse (!) 58    Temp 36.6 C (Temporal)    Resp 18    Ht 6\' 4"  (1.93 m)    Wt (!) 104.3  kg (230 lb)    SpO2 99%    BMI 28.00 kg/m   General Appearance:alert, cooperative and in acute distress   Skin: extensive ecchymoses flanks and lower back and LUE. No open wounds.   HEENT: anicteric sclera, pink conjunctiva, PERRL, EOMI  Lungs: clear to auscultation, no wheezes, some pain with deep inspirations  Chest: no bony deformities visible  Cardiovascular: regular rate and rhythm   Abdomen: soft, obese, non  distended. BS normal.   Musculoskeletal: antalgic unsteady gait.   Psych: mood and affect full and appropriate. Speech normal in content and pattern.       ASSESSMENT/PLAN: 1. Fall, initial encounter    - XR L Spine 2-3 View; Future  - XR Ribs Unilat 2 View Right; Future  - HYDROcodone-acetaminophen 5-325 MG tablet; Take 1 tablet by mouth every 8 hours as needed for severe pain.  Dispense: 10 tablet; Refill: 0    2. Chronic midline low back pain, unspecified whether sciatica present    - XR L Spine 2-3 View; Future  - XR Ribs Unilat 2 View Right; Future  - HYDROcodone-acetaminophen 5-325 MG tablet; Take 1 tablet by mouth every 8 hours as needed for severe pain.  Dispense: 10 tablet; Refill: 0    3. Flank pain  - HYDROcodone-acetaminophen 5-325 MG tablet; Take 1 tablet by mouth every 8 hours as needed for severe pain.  Dispense: 10 tablet; Refill: 0        The patient indicates understanding of these issues and agrees with the plan. I reviewed the patient's medical information, the past medical, family, and social history sections including the medications and allergies listed in the above medical record.     Mariel Aloe, MD      Providers of Care:   Please note that Dragon speech recognition was used in creating this dictation. A reasonable proofreading attempt has been made; however, misrecognition errors may occur.

## 2021-02-05 NOTE — Telephone Encounter (Signed)
Walk in  4th of July morning he fell in the shower.  He is bruised and would like to be seen.  Please Francee Piccolo  Thank you

## 2021-02-05 NOTE — Telephone Encounter (Signed)
Ronnie Mason walked into the clinic this morning with bruising and rib pain after a fall the morning of 02/03/21.     He describes showering, using conditioner and turning to rinse while holding on to a grab bar. He was on a shower mat, but slipped with both his feet going out from under him, his left arm sliding down from the grab bar and resulting in a bruise on the underside of his arm. He hit the bench with his right low back and has a large deep bruise to mid way up his side. His ribs are hurting on the right side with deep breaths.     He has been overall sore and having difficulty moving from sit to a stand. He is sleeping on his left side mostly comfortably. He has been taking tylenol and ibuprofen.     He would like to be seen. He has a history of surgery on his right foot and has follow up appointments for post op and also for back pain at [facility] on Friday this week.  He wants to know if it makes sense to present for his back intervention if he is bruised and sore from this fall. Of note he is listed to have MRSA contact precautions.     I scheduled him at 11:40 with Dr.Dougan for head to toe evaluation. He is also new to our clinic.     Adah Perl, RN

## 2021-02-06 ENCOUNTER — Telehealth (HOSPITAL_BASED_OUTPATIENT_CLINIC_OR_DEPARTMENT_OTHER): Payer: Self-pay

## 2021-02-06 NOTE — Telephone Encounter (Signed)
Called patient and relayed Dr. Nathaniel Man message:  "With such extensive bruising (and likely big fall) I don't think it's the right time to do diagnostic MBB. Too many muscles and other things could be aggravated. I would recommend deferring the procedure a month"  Notified patient our scheduler will call and reschedule appointments

## 2021-02-06 NOTE — Result Encounter Note (Signed)
Please call pt and let him know he has several rib fractures seen by the radiologist. The ribs are all on the right 10th, 11th, and 12th ribs and possible fracture of the ninth rib. He does not need surgery but this is why he is having the pain.     Mariel Aloe, MD

## 2021-02-06 NOTE — Telephone Encounter (Signed)
Needs to cancel 1st series of 2 MMB appointment on 02/07/2021 and reschedule in 2nd series appointment on 02/21/2021 then reschedule 2nd series appointment.

## 2021-02-07 ENCOUNTER — Telehealth (INDEPENDENT_AMBULATORY_CARE_PROVIDER_SITE_OTHER): Payer: Self-pay

## 2021-02-07 ENCOUNTER — Ambulatory Visit (HOSPITAL_BASED_OUTPATIENT_CLINIC_OR_DEPARTMENT_OTHER): Payer: Self-pay | Admitting: Anesthesiology

## 2021-02-07 ENCOUNTER — Ambulatory Visit (HOSPITAL_BASED_OUTPATIENT_CLINIC_OR_DEPARTMENT_OTHER): Payer: Medicare PPO | Admitting: Anesthesiology

## 2021-02-07 ENCOUNTER — Encounter (HOSPITAL_BASED_OUTPATIENT_CLINIC_OR_DEPARTMENT_OTHER): Payer: Self-pay

## 2021-02-07 ENCOUNTER — Encounter (INDEPENDENT_AMBULATORY_CARE_PROVIDER_SITE_OTHER): Payer: Medicare PPO | Admitting: Podiatrist

## 2021-02-07 DIAGNOSIS — G8929 Other chronic pain: Secondary | ICD-10-CM

## 2021-02-07 DIAGNOSIS — W19XXXA Unspecified fall, initial encounter: Secondary | ICD-10-CM

## 2021-02-07 DIAGNOSIS — S2241XA Multiple fractures of ribs, right side, initial encounter for closed fracture: Secondary | ICD-10-CM

## 2021-02-07 DIAGNOSIS — M545 Low back pain, unspecified: Secondary | ICD-10-CM

## 2021-02-07 DIAGNOSIS — R109 Unspecified abdominal pain: Secondary | ICD-10-CM

## 2021-02-07 MED ORDER — HYDROCODONE-ACETAMINOPHEN 5-325 MG OR TABS
1.0000 | ORAL_TABLET | Freq: Three times a day (TID) | ORAL | 0 refills | Status: DC | PRN
Start: 2021-02-07 — End: 2021-04-01

## 2021-02-07 NOTE — Telephone Encounter (Signed)
-----   Message from Mariel Aloe, MD sent at 02/06/2021  5:32 PM PDT -----  Please call pt and let him know he has several rib fractures seen by the radiologist. The ribs are all on the right 10th, 11th, and 12th ribs and possible fracture of the ninth rib. He does not need surgery but this is why he is having the pain.     Mariel Aloe, MD

## 2021-02-07 NOTE — Telephone Encounter (Signed)
Call placed to patient to notify him of his rib fractures. He said he saw the report & was aware. He has been taking the pain meds every 8 hours or so & only has two left. Requesting a refill to a pharmacy down in [place]. Pended the refill for Dr Royetta Crochet to authorize.   Also gave education on supporting/using a pillow to his side when coughing. Encouraged deep breathing & to start a stool softener. He explained that stooling is difficult & painful. Suggested senna or benefiber also to the stool softener & to hydrate well.   Suggested that he also start to wean off of the medication by using the hydrocodone/acetaminophen every >8 hours (ie:9,10,11, etc). He will start to do this also.  Sue Lush, RN

## 2021-02-09 ENCOUNTER — Encounter (INDEPENDENT_AMBULATORY_CARE_PROVIDER_SITE_OTHER): Payer: Self-pay

## 2021-02-10 ENCOUNTER — Ambulatory Visit (INDEPENDENT_AMBULATORY_CARE_PROVIDER_SITE_OTHER): Payer: Medicare PPO | Admitting: Podiatrist

## 2021-02-10 DIAGNOSIS — L905 Scar conditions and fibrosis of skin: Secondary | ICD-10-CM

## 2021-02-10 DIAGNOSIS — L02619 Cutaneous abscess of unspecified foot: Secondary | ICD-10-CM

## 2021-02-10 NOTE — Progress Notes (Signed)
Procedure:  Closure, wound, foot, delayed V to Y skin advancement  RIGHT.  PO week #:  8 week post op  , Date of surgery:  12/09/20    Ronnie Mason is a 70 year old year old male return today for routine post operative check.    Patient relates current issues: no new problems/complaints.     The patient states no major issue.    The patient states of minimal pain.    The patient is compliant with post-operative instructions.    Current instructed post-op weightbearing status with cane, but did fall in shower on 02/03/21 and bruised 4 ribs and broke one. So moving slow    The patient denies fever/chill, SOB/CP and calf pain.    Patient denies of side effects of the prescribed medication.    Current post-op pain medication is none.    The pain is well controlled.    Current therapy:  no physical therapy at this point.    DVT prophylaxis:  none - patient is ambulating.

## 2021-02-10 NOTE — Progress Notes (Signed)
Post-Surgery Follow-Up SOAP Note    Patient: Ronnie Mason   Patient DOB: Apr 08, 1951     DOS:  02/10/2021     Accompanied by:  patient was unaccompanied    Subjective:   (history is obtained by MA/RN and edited by [name])  Procedure:  Closure, wound, foot, delayed V to Y skin advancement  RIGHT.  PO week #:  8 week post op  , Date of surgery:  12/09/20    Ronnie Mason is a 70 year old year old male return today for routine post operative check.    Patient relates current issues: no new problems/complaints.     The patient states no major issue.    The patient states of minimal pain.    The patient is compliant with post-operative instructions.    Current instructed post-op weightbearing status with cane, but did fall in shower on 02/03/21 and bruised 4 ribs and broke one. So moving slow    The patient denies fever/chill, SOB/CP and calf pain.    Patient denies of side effects of the prescribed medication.    Current post-op pain medication is none.    The pain is well controlled.    Current therapy:  no physical therapy at this point.    DVT prophylaxis:  none - patient is ambulating.      PMH:   Past Medical History:   Diagnosis Date   . Pain of right hip joint 01/23/2016   . Intraparenchymal hemorrhage of brain (HCC) 2017   . 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 Allergic rhinitis due to allergen    . Carotid Sinus Hypersensitivity    . Disc disorder of lumbosacral region    . Dizziness    . Heart murmur    . Hip injury    . HYPERTENSION     . HYPOTENSION     . Nosebleed    . Strep throat    . Stroke (HCC)    . Syncope    . 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(HCC)    . URI (upper respiratory infection)         Review of patient's allergies indicates:  Allergies   Allergen Reactions   . Adhesives Skin: Rash     Glue on lido patches   . Bee Venom Skin: Hives, Skin: Itching and Swelling   . Gabapentin Other     Flu like symptoms, diarrhea, body ache upset stomach   .  Methocarbamol Other     Patient's wife reports cognitive difficulties ("goofy")        Medications:   Outpatient Medications Prior to Visit   Medication Sig Dispense Refill   . Acetaminophen 500 MG Oral Tab Take 1,000 mg by mouth 2 times a day.     . Alpha-Lipoic Acid 300 MG tablet Take 1 tablet by mouth daily.     [name] amLODIPine 5 MG tablet Take 1 tablet (5 mg) by mouth 2 times a day. 180 tablet 3   . amoxicillin-clavulanate 875-125 MG tablet Take 1 tablet (875 mg) by mouth 2 times a day. Take 1 tablet (875 mg) by mouth 2 times a day for 10 days. 20 tablet 0   . atorvastatin 10 MG tablet Take 1 tablet (10 mg) by mouth daily. Restart 09/19/2020 90 tablet 1   . BENFOTIAMINE OR Take 250 mg by mouth daily.     . Cholecalciferol (VITAMIN D3) 2000 units Oral Cap Take 1 capsule (  2,000 Units) by mouth daily. For low level 1 capsule 1   . Cyanocobalamin 1000 MCG Oral Tab one per day over-the-counter started 5/21 /2012 this level borderline low 1 Tab 1   . diclofenac 1 % gel Apply 4 g topically 4 times a day. Apply to 2 gram four times daily to neck, trapezius muscle, and low back. Use a max of 32 grams a day. 300 g 4   . DULoxetine 60 MG DR capsule Take 1 capsule (60 mg) by mouth 2 times a day. 180 capsule 2   . EPINEPHrine 0.3 MG/0.3ML auto-injector Inject as instructed per patient package insert, 0.3 mg intramuscularly or subcutaneously into the thigh, if needed to treat anaphylaxis 4 each 1   . HYDROcodone-acetaminophen 5-325 MG tablet Take 1 tablet by mouth every 8 hours as needed for severe pain. 20 tablet 0   . ibuprofen 200 MG tablet Take 200 mg by mouth daily as needed (pain).     [name] lidocaine 5 % patch Apply 1 patch onto the skin daily. Apply to painful area for up to 12 hours in a 24 hour period. (Patient taking differently: Apply 1 patch onto the skin daily as needed. Apply to painful area for up to 12 hours in a 24 hour period.) 30 patch 5   . lisinopril 20 MG tablet Take 1 tablet (20 mg) by mouth every 12 hours.  180 tablet 1   . meloxicam 15 MG tablet take 1 tablet by mouth once daily 20 tablet 1   . Omega-3 Fatty Acids (OMEGA 3 OR) Take 1 tablet by mouth daily.     . pregabalin 300 MG capsule Take 1 capsule (300 mg) by mouth 2 times a day. 60 capsule 5   . primidone 50 MG tablet Take 1 tablet (50 mg) by mouth 2 times a day. 180 tablet 4     Facility-Administered Medications Prior to Visit   Medication Dose Route Frequency Provider Last Rate Last Admin   . iohexol (Omnipaque) 240 MG/ML contrast injection 10 mL  10 mL See Admin Instructions Once Alvester Chou, MD            Objective:   There were no vitals filed for this visit.     The surgical sites were examined and the incision(s) has/have healed and hypertrophic.  There is no pain on surgical site.  There is minimal swelling noted.  There is no signs of DVT i.e. no calf pain, swelling and redness.     Assessment:   Post op 8 weeks to address (L90.5) Scar tissue, right  (primary encounter diagnosis)  (L02.619) Abscess of foot    The patient is making good progress post-operatively.     Plan:    A discussion took place regarding post-operative progress.  Patient was instructed to call our office if they notice any significant change is post-operative progress and experience severe pain, swelling, fever/chill, calf pain, SOB.  Precaution were stressed today regards to level of activity/elevation/icing.      Ambulatory status:  The patient was instructed to return to regular activity as tolerated using stable shoes and with Quickstride.       Physical therapy:  referral to physical therapy.  - scar tissue mobilization.      Return/Follow-up:  PRN or symptom worsen    [name] DPM, FACFAS  Reconstructive Ankle and Foot Surgeon  [facility]

## 2021-02-21 ENCOUNTER — Ambulatory Visit (HOSPITAL_BASED_OUTPATIENT_CLINIC_OR_DEPARTMENT_OTHER): Payer: Medicare PPO | Admitting: Anesthesiology

## 2021-02-23 ENCOUNTER — Encounter (INDEPENDENT_AMBULATORY_CARE_PROVIDER_SITE_OTHER): Payer: Self-pay | Admitting: Podiatrist

## 2021-03-01 ENCOUNTER — Other Ambulatory Visit (INDEPENDENT_AMBULATORY_CARE_PROVIDER_SITE_OTHER): Payer: Self-pay | Admitting: Internal Medicine

## 2021-03-01 DIAGNOSIS — E785 Hyperlipidemia, unspecified: Secondary | ICD-10-CM

## 2021-03-04 MED ORDER — ATORVASTATIN CALCIUM 10 MG OR TABS
ORAL_TABLET | ORAL | 3 refills | Status: DC
Start: 2021-03-04 — End: 2021-07-23

## 2021-03-07 ENCOUNTER — Other Ambulatory Visit: Payer: Self-pay

## 2021-03-13 NOTE — Progress Notes (Signed)
DIAGNOSTIC NERVE BLOCK OF LUMBAR FACET JOINTS (Left L4-L5, L5-S1)    PROCEDURE  Lumbar Medial Branch blocks targeting the left L4-L5, L5-S1 lumbar facet joints     PRE-PROCEDURE DIAGNOSIS  left lumbar facet arthropathy    POST-PROCEDURE DIAGNOSIS  left lumbar facet arthropathy    INDICATION FOR INJECTION  Ronnie Mason has moderate to severe axial pain which impacts his  function and quality of life and has been present for more than 3 months.  He has had reasonable conservative treatment efforts including physical therapy and medication management as outlined in my note dated 11/22/20.  His imaging does not reveal another source of pain and is consistent with a facet source for his pain. His physical examination is consistent with a facet source for his pain.     If two injections on two different sessions lead to substantial pain relief and improvement in function, we would conclude that the tested joints are likely a relevant source of his pain. In this case, radiofrequency ablation of the nerves could be offered as a treatment.    ATTENDING PHYSICIAN: [name], MD    FELLOW / RESIDENT: Kathrin Greathouse, MD         SEDATION:  NO    NPO STATUS:  NO    IMAGING TECHNIQUE: Multiplanar fluoroscopic guidance was used for this procedure, appropriate images were saved.     TIME OUT/FINAL VERIFICATION: performed and documented.    POSITION:  Prone    PREP:  ChloraPrep and sterile drapes.  Sterile technique used throughout.     DESCRIPTION OF PROCEDURE    left L4, L5 pedicles (medial branch L3, L4, L5)   Imaging for needle placement: An anterior-posterior fluoroscopic image allowed symmetric visualization of the upper and lower endplates; then the beam was tilted oblique to visualize the junction between transverse process and articular process at L4 and L5. Lidocaine 1% was used to anesthetize the skin at each level using a 25 G 2.5 inch needle.   Procedure needle used: 22G, 3.5 inch.  Needle insertion: Down the  beam.  Imaging conditions: Good visualization of the target bony structures.  Procedure needle positioned inferior and lateral to the intersection of the transverse process and pedicle at L4 & L5.  Injection of 0.64ml of Bupivicaine 0.25% at each level.  Uneventful removal of the needle.    left S1 pedicle (dorsal ramus L5)  Imaging for needle placement: An anterior-posterior fluoroscopic image allowed symmetric visualization of the upper and lower endplates; then the beam was tilted oblique to visualize the sacral alar notch. Lidocaine 1% was used to anesthetize the skin at the injection site using a 25G 2.5 inch needle.  Procedure needle used: 22 G, 3.5 inch.  Needle insertion: Down the beam.  Imaging conditions: Good visualization of the target bony structures  Procedure needle positioned over sacral alar notch.  Injection of 0.68ml of Bupivicaine 0.25% at each level.  Uneventful removal of the needle.        COMPLICATIONS:   None.    POST-PROCEDURE:    He was escorted to the recovery area in stable condition where he  made an uneventful recovery.    Mr. Adrian was reassessed after the procedure.  The pain score was  9/10 before the procedure and 2/10 prior to discharge (0 = no pain, 10 = worst pain imaginable).  Mr. Bossier did note initial improvement in pain, including pain with movement.     PLAN  Mr. Walgren  was instructed  to keep his  pain diary and report the results of this procedure.  Subsequent care will be based on the results of today's procedure.    Kathrin Greathouse, MD  Pain Fellow

## 2021-03-14 ENCOUNTER — Ambulatory Visit: Payer: Medicare PPO | Attending: Anesthesiology | Admitting: Anesthesiology

## 2021-03-14 VITALS — BP 134/81 | HR 86 | Temp 97.3°F | Resp 14

## 2021-03-14 DIAGNOSIS — M47816 Spondylosis without myelopathy or radiculopathy, lumbar region: Secondary | ICD-10-CM | POA: Insufficient documentation

## 2021-03-14 NOTE — Patient Instructions (Signed)
Post-Procedure Instructions with Pain Diary      Post-procedure care:  . Today, you underwent a procedure that may or may not treat your pain, but should help us identify the source of your pain.  . Continue taking your scheduled medications.  . If you experience increasing pain, take your regular short-acting medications.    Site care:  . You may remove the band-aid after a few hours.  . You may shower today. No swimming, tub baths, or hot tubs for 24 hours following your procedure.    Activity recommended during the 1st 6 hours after the procedure:  . Continue normal activities, including those that cause the pain.     . Do not lie down and rest after the procedure.    . Do not drive or operate machinery for 24 hours after your procedure if you were given sedation    Medications:  If you stopped taking any blood thinning medications such as Coumadin or Plavix, you may resume these tomorrow unless specified differently by the prescribing physician.    Follow-up:   You are being sent home with the Patient Self Administered 6 Hour Pain Diary to record your pain scores during the 1st 6 hours after your procedure.  After you have completed your 6 hour pain diary, find the time when you had the lowest pain score meaning the least amount of pain.  Then answer "When I had the lowest pain score= least amount of pain, my pain was relieved by _____%".    If you have pain relief longer than 6 hours after your procedure, please make note of this on the diary every 4-5 hours until the pain returns.  You do not need to stay up and active during this time.     Please call the Pain Score Reporting voice mail at [phone] to report your 6 hour pain diary results.  Your report will be sent to provider to review and make further recommendations.  You may not be scheduled for a follow-up appointment until you have completed this call.    Call us if you develop any of the following symptoms in the next 7 days:  . Fever above 100  degrees F     . Any unusual increase in your level of pain  . Swelling, bleeding, redness, or increased tenderness at the procedure or IV site    Contact us:  Anytime, 24 hours/day, 7 days/week at [phone]          Patient Self-Administered 6 hour Pain Diary    1.  For accuracy, keep this diary with you during the 6 hour period so you can write down your pain scores as you go, at the time noted on your diary.  During the 6 hours, please your pain scores while doing activities that provoke your pain as well as when you are not doing those activities.      2.  Within 24 hours of completing this pain score diary, please send us your pain score diary either by:  1)  Scanning into [facility]  2)  Faxing to [phone] or 3)  Calling the pain score voicemail line at [phone].  Leave the spelling of your first and last name,  U#, date of birth, date of service and just the 20 numbers you wrote below, including pre-procedure, immediately post procedure and % pain relief.  After that, you may leave any comments regarding your post block experience that may be helpful (e.g. was able to   walk, much worse, had to take medications).      Use this scale to rate your pain  0 1 2 3 4 5 6 7 8 9 10  No pain                                          worst pain     TIME PAIN SCORE WITH PAINFUL ACTIVITY PAIN SCORE WITHOUT PAINFUL ACTIVITY   Before injections:       Immediately after injections:       30 minutes after injections:       1 hour after injections:       2 hours after injections:       3 hours after injections:       4 hours after injections:       5 hours after injections:       6 hours after injections:       When I had the lowest pain score, my % (percentage) pain relief:          IT IS ESSENTIAL YOU CALL IN YOUR PAIN DIARY RESULTS BEFORE ANY FURTHER PROCEDURE(S) CAN BE SCHEDULED.

## 2021-03-14 NOTE — Progress Notes (Signed)
Mr. Marietta identified by name and DOB.   He confirmed he is having a Left L4-L5 and L5-S1 MBB (1/2)  He ambulated to procedure area with steady gait.  He has  fallen in the last 6 months.  Fall prevention interventions: Patient provided with non-skid stockings  He denies taking any blood thinners.  He denies having a bleeding disorder.  He  Has held his medications per provider instructions.  He has not been on antibiotics for the past two weeks.   He denies a history of fainting during medical procedures.   He has not been NPO.  He undressed self without assistance.  Is He receiving a steroid injection today? no  If yes, has He received the COVID-19 vaccine (either brand) within the past 2 weeks? no  OR does He plan to receive the COVID-19 vaccine (either brand) within the next 2 weeks?no  I verbally reviewed written discharge instructions and pain diary with him.   Confirmed ride home with his wife, Eduardo Osier in [place]     He has a pre-procedure pain score of 9 low back    MANEUVERS PAIN ASSESSMENT FOR LUMBAR MEDIAL BRANCH BLOCK    TIME Pain Score at rest: Pain Score:  Bend forward   Pain Score:   Bend backward   Pain Score:  Back Bend;twist right Pain Score:  Back Bend; twist left     Pre-Procedure       3 3-4 8 9 9      MANEUVERS PAIN ASSESSMENT FOR LUMBAR MEDIAL BRANCH BLOCK    TIME Pain Score at rest: Pain Score:  Bend forward   Pain Score:   Bend backward   Pain Score:  Back Bend;twist right Pain Score:  Back Bend; twist left     30 minutes post procedure   3 2 7 7 2      Pain score prior to procedure:  9/10  Pain score after procedure:  2/10    Mr. Dillin met discharge criteria:  A/OX4/baseline, VSS/returned to baseline, ambulatory with steady gait. Site clean dry and intact.  Written and verbal discharge instructions, including emergency contact phone numbers, reviewed with Mr. Reece. Verbal understanding obtained from him.    Mr. Grose dressed self without assistance. He was discharged in stable condition,  ambulatory with steady gait to home with all belongings and with ride/responsible person.

## 2021-03-14 NOTE — Progress Notes (Signed)
I was present for the entire procedure (left medial branch block of L4-5, L5-S1) which was performed under my direct personal supervision.   I reviewed the documentation of the other providers and concur with Dr. Madilyn Fireman findings. I edited the procedure note.      [name], MD  Assistant Professor  Department of Anesthesiology and Pain Medicine  [facility]

## 2021-03-20 ENCOUNTER — Encounter (HOSPITAL_BASED_OUTPATIENT_CLINIC_OR_DEPARTMENT_OTHER): Payer: Self-pay | Admitting: Anesthesiology

## 2021-03-20 NOTE — Telephone Encounter (Signed)
Called pt to discuss appointment for tomorrow and ask if pt wants procedure.     Pt states will see Dr. Araceli Bouche for follow up, will not proceed with MBB #2

## 2021-03-20 NOTE — Telephone Encounter (Signed)
Procedure MD:  Dr. Araceli Bouche  Procedure:  left medial branch block of L4-5, L5-S1  Date of Procedure:  03/14/2021    Pain Scores: Patient scanned. Please see link under 'Encounter Images' or EPIC Media    Follow Up: Next Procedure appointment with Dr. Araceli Bouche on 03/21/2021  Mr. Corning's Comments:  See E-Care message

## 2021-03-20 NOTE — Progress Notes (Signed)
Ronnie Mason;  MRN: [id number];   DOB: [birth year]    CHIEF COMPLAINT:   Low back pain, worse to left side.    HISTORY OF PRESENT ILLNESS:  The patient presents to today for interval reevaluation of interval reevaluation oflow back and right hip pain. The patient has a pain history significant forRight Hip OA, R greater trochanter bursitis, L4-5 L5-S1 Facet arthropathy, radiculopathy, and myofascial pain. The patient was last seen byDr. Araceli Bouche on 03/14/21 for lumbar medial branch block.     Last office visit on 11/22/20 recommendations:  Medication suggestions:   Continue pregabalin 300mg  BID  Continue duloxetine 60mg  BID  Continue acetaminophen up to 2 gm per day  Continue diclofenac gel    Procedure suggestions: Will plan for left medial branch block of left L4-5, L5-S1     Referral to PT as patient now starting to improve and engage with PT. May help him find more exercises to do in the pool.  Last MBB on 03/14/21 gave no significant relief. He was increased for several days after the procedure. Just started feel better 2 days ago.  Today the patient's pain is rated as 3-4/10 in severity on the verbal numeric rating scale.     The pain is located lower left lumbar. But does tend to switch back and forth.   He describes his pain as stabbing. He feels the pain is  Unchanged. Pain in back is constant. Leg pain is intermittent, occurring a couple times a month.  The pain is made better by sitting.  The pain is made worse by standing, walking, twisting, bending.  Mood: feels pretty good.  Currently glass or two of wine in the evening.    Current medications:  pregabalin 300mg  BID  duloxetine 60mg  BID  acetaminophen up to 2 gm per day  diclofenac gel   Naproxen 2/day  Meloxicam     Currently in PT 1-2x/wk: causes some increased but overall feels beneficial.    Other than that as stated above the patient denies any changes in past medical history, medications, or allergies since the last office visit.    REVIEW OF  SYSTEMS:  The review of systems documented by our clinic staff was provided by the patient. I reviewed and confirmed these details with 05/14/21 and have no further detail to add.    PHYSICAL EXAMINATION:  BP (!) 145/84    Pulse 88    Temp 36.6 C (Tympanic)    Resp 16    Ht 6\' 4"  (1.93 m)    Wt (!) 104.3 kg (230 lb)    SpO2 95%    BMI 28.00 kg/m    Constitutional: He is Constitutional: well-appearing, in no distress and cooperative and exhibits no clear pain behavior  Skin, exposed parts: color normal  Eyes: Negative for miosis bilaterally. Pupils appropriate for room lighting. Extraocular movements are intact bilaterally  Neurologic: deferred today.  Musculoskeletal: deferred today  Psychiatric: Judgement/insight are good. Mood is good. Affect is normal/appropriate. Patient is oriented to situation      IMPRESSION:   Ronnie Mason is a 70 year old male with:  1. Right hip pain, Xrays from 2018 reviewed showing severe OA.This has largely resolved currently, minimal to no pain in this area at present.  2. Axial low back painsecondary tofacet arthropathy at L4-L5,L5-S1 with prior positive response to RFA(last in 2017) as well as lumbar facet arthropathy.Continue to suspect a significant component of myofascial pain given exacerbation with movement, reproducible tenderness  to palpation. 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  3. Lower back pain with radiation into bilateral lower extremities radicular in nature. Notes less radicular pain at this time, primarily axial in nature, however does have prior evidence of disc bulge on imaging.  4. Deconditioning-Recent right foot surgery. PT currently on hold.  5. Prior treatment failures including:Short duration of benefit forinterlaminar lumbarepidural steroids and TFESI, limited benefit to lumbar MBB recently, memantine,  Naltrexone.  6. Multiple prior injections by outside providers( trigger point injections):   Dr. [name]  ? 03/23/2018 -LEFT L5 TFESI with 80-85%relief for about 3-4 weeksof just axial back pain.  ? 09/15/2017 -Bilateral L5TFESI with 50-75% reliefof mixture of low back and leg pain.  ? 05/19/2017 -RIGHT L5 TFESIwith 100% relief ofRadicularsymptoms for about 3 months  ? 08/12/2016 - L5-S1 ILESI  ? 07/01/2016 - Ultrasound guided right trochanteric bursa injection   ? 05/20/2016 - Bilateral shoulder injections  ? 05/13/2016 - Right L3 L4 L5/S1 RFA   ? 02/19/2016 - Left L3, L4, L5/S1 RFA   ? 12/27/2012 - L5-S1 IL ESI  ? 08/31/2012 - Left L5 TFESI  ? 03/30/2012 - Left L4/5, L5/S1 facet inj  ? 02/10/2012 - Left C6 C7 MB RFA  ? 07/03/2011 - Left L3, L4, L5/S1 RFA  ? 05/12/2011 - Left C4, C5, C6 RFA  7. 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     --Continue PT and pool workouts/walking  --Referral to pain psych class.  --Medications unchanged.  --Discussed the use of myofascial release balls (ie tennis balls)  --Follow up with Dr. Araceli Bouche in 6 months.    Cheri Fowler spent 40 minutes on the patient's care.

## 2021-03-21 ENCOUNTER — Ambulatory Visit: Payer: Medicare PPO | Attending: Anesthesiology | Admitting: Anesthesiology

## 2021-03-21 ENCOUNTER — Encounter (HOSPITAL_BASED_OUTPATIENT_CLINIC_OR_DEPARTMENT_OTHER): Payer: Self-pay | Admitting: Anesthesiology

## 2021-03-21 VITALS — BP 145/84 | HR 88 | Temp 97.9°F | Resp 16 | Ht 76.0 in | Wt 230.0 lb

## 2021-03-21 DIAGNOSIS — M545 Low back pain, unspecified: Secondary | ICD-10-CM | POA: Insufficient documentation

## 2021-03-21 DIAGNOSIS — M48061 Spinal stenosis, lumbar region without neurogenic claudication: Secondary | ICD-10-CM | POA: Insufficient documentation

## 2021-03-21 DIAGNOSIS — G894 Chronic pain syndrome: Secondary | ICD-10-CM | POA: Insufficient documentation

## 2021-03-21 DIAGNOSIS — M7918 Myalgia, other site: Secondary | ICD-10-CM | POA: Insufficient documentation

## 2021-03-21 DIAGNOSIS — M47816 Spondylosis without myelopathy or radiculopathy, lumbar region: Secondary | ICD-10-CM | POA: Insufficient documentation

## 2021-03-21 DIAGNOSIS — G8929 Other chronic pain: Secondary | ICD-10-CM | POA: Insufficient documentation

## 2021-03-21 DIAGNOSIS — Z79899 Other long term (current) drug therapy: Secondary | ICD-10-CM | POA: Insufficient documentation

## 2021-03-21 DIAGNOSIS — M792 Neuralgia and neuritis, unspecified: Secondary | ICD-10-CM | POA: Insufficient documentation

## 2021-03-21 NOTE — Patient Instructions (Addendum)
PLAN:    My impressions and treatment recommendations were discussed in detail with the patient who had no further questions before leaving the office today.     --Continue PT and pool workouts/walking  --Referral to pain psych class.  --Medications unchanged.  --Discussed the use of myofascial release balls (ie tennis balls)  --Follow up with Dr. Araceli Bouche in 6 months.

## 2021-03-21 NOTE — Progress Notes (Signed)
Review of Systems   Constitutional: Negative.    HENT: Negative.    Eyes: Negative.    Respiratory: Negative.    Cardiovascular: Negative.    Gastrointestinal: Negative.    Endocrine: Negative.    Genitourinary: Negative.    Musculoskeletal: Positive for back pain and gait problem.   Skin: Negative.    Allergic/Immunologic: Negative.    Hematological: Negative.    Psychiatric/Behavioral: Negative.

## 2021-03-21 NOTE — Progress Notes (Signed)
I saw and evaluated the patient with Cheri Fowler, ARNP, who conducted the initial history.  I reviewed and confirmed the history in detail with Ronnie Mason in person.   I was present for the examination and formulation portions of the encounter. I agree with the findings and the plan of care as documented in our notes prepared by Dr. Sherlon Handing (did not see patient) and completed by Cheri Fowler. I did edit the trainee's note.      Ronnie Sartorius, MD  Assistant Professor  [facility]

## 2021-03-24 ENCOUNTER — Encounter (INDEPENDENT_AMBULATORY_CARE_PROVIDER_SITE_OTHER): Payer: Self-pay

## 2021-03-31 ENCOUNTER — Ambulatory Visit: Payer: Medicare PPO | Attending: Social Worker | Admitting: Social Worker

## 2021-03-31 DIAGNOSIS — M545 Low back pain, unspecified: Secondary | ICD-10-CM | POA: Insufficient documentation

## 2021-03-31 DIAGNOSIS — G8929 Other chronic pain: Secondary | ICD-10-CM | POA: Insufficient documentation

## 2021-03-31 DIAGNOSIS — F54 Psychological and behavioral factors associated with disorders or diseases classified elsewhere: Secondary | ICD-10-CM | POA: Insufficient documentation

## 2021-03-31 NOTE — Patient Instructions (Addendum)
As we discussed, I will be placing a referral to our next Chronic Pain Coping Skills and Gentle Movement Class.  It's 8 weeks in length and meets for about 3 hours each week.  The first 2 hours are focused on chronic pain coping skills with Dr. Gweneth Dimitri (pain psychologist), and last hour is spent practicing gentle movement with Arne Cleveland (physical therapist).    Below is a week by week overview of the content covered by Dr. Javier Docker:     Introduction and orientation to the group, review of goals for group and topics covered, begin relaxation training.  First group is an overview of topics covered, goals of pain psychology, introduction of cognitive-behavioral theory, gate control theory, the role of relaxation and stress response, and participants are asked to identify individual goals for group. Guided meditation is practiced at the end of every session.     Overview of pain neuroscience, immune system and pain, myofascial and muscle pain, relaxation practice.  In the second group, we go over pain neuroscience education in more detail, including an overview of the pain neuromatrix, modulation of pain perception, the immune system's role in sensitization, and the role of emotion and appraisal in sensitization. The role of movement is also briefly reviewed and covered in more detail during the gentle movement portion of group.     Movement and pain, desensitization training, review of time-based activity pacing, relaxation practice.  In the third group we review time-based activity pacing for desensitization and increasing activity, including a review of the chronic pain cycle and explanation of the protect by pain threshold. The role of movement in addressing pain sensitization is also briefly reviewed.     Addressing unhelpful thinking styles, reducing rumination/worry, thoughts and behavior, relaxation practice.  In the fourth group we review cognitive tools and rational for addressing unhelpful thinking  styles, including thought challenging, thought defusion, and automatic negative thoughts. The role of attention, appraisal, and behavior is reviewed with regard to pain sensitization and distress.     Goal setting and achievement, connecting with core values, increasing self-efficacy, sleep hygiene and behaviors that affect sleep, relaxation practice.   The fifth group is focused on goal setting and strategies to support goal pursuit and behavior change. We discuss goal pursuit as a function of value and self-efficacy. Review SMART goal setting and values-based goal setting. Review importance of improving sleep quality with chronic pain. Review sleep hygiene and sleep promoting behaviors/tools.      Practicing acceptance, clean vs. dirty pain, somatic tracking practice.   In the sixth group we get into acceptance. We review metaphors that illustrate the practice of acceptance and mindfulness, how acceptance may help to reduce distress associated with chronic pain, the concepts of clean vs. dirty pain, and emotion processing. We practice a somatic tracking exercise.      Control agenda cost/benefit exercise, creative hopelessness, mindfulness practice, review of concepts, relaxation practice.  For the 7th group we discuss cost/benefit analysis of control strategies and relate this back to acceptance and mindfulness. We discuss the concepts of creative hopelessness and assertive communication. We review the 7 key factors of mindfulness and the core concepts in acceptance and commitment therapy and relate them back to skills learned so far.      Review of concepts learned and goals of group, maintaining behavior going forward, additional resources, relaxation practice.  In the final group, we review skills learned so far, review goals set at the beginning of group, discuss creating a  coping skills toolkit, and discuss using tools/skills to maintain gains going forward, and various online resources for pain and mood  management.   ____    Explain Pain videos    Understanding Pain: What to do about it in less than five minutes:   [email or web address]    Dr. Frederick Peers and Dr. Cathleen Corti - Pain: Do You Get It?  https://youtu.be/[email or web address]     Dr. Frederick Peers - 7 Amazing Pain Facts that Could Change Your Life   [email or web address]  _____    Curable    Curable is a subscription-based online pain psychology app that educates individuals about the stress-pain connection, and provides evidence-based tools for behavioral coping skills, mindfulness and meditation.    Modern research shows that psychological and emotional elements can play a major role in chronic pain. These non-physical components can help the brain learn to be in pain, re-wiring the bodys neural circuitry to perpetuate the sensation of pain.    With proper therapeutic attention, the brain can "unlearn" pain, paving the way to physical pain relief. Research shows that methods like education, writing, meditation, visualization, and cognitive behavioral therapy can successfully help the brain to stop this recurring pain cycle.    The Curable recovery program incorporates this modern research into easily accessible education and exercises that can help people break the pain cycle and find relief from chronic pain.    Info: http://www.clayton.com/    Free 6 week trial: http://curable.com/connect-qr/6460486975750144  _____    [employer] acupuncture is the practice of treating patients together in a large room. This differs from traditional acupuncture where treatment is administered in a private room setting.  Many clinics across the country, including ours, have chosen to adopt the community acupuncture model approach because it makes getting treatment more accessible and affordable.     Plus, treating patients in a community setting has many benefits--its easy for friends and family  members to come in for treatment together; many patients find it comforting; and a Scientist, water quality field becomes established which actually makes individual treatments more powerful.     In addition to these benefits, one of the things we love about the community acupuncture model is that it mirrors how traditional acupuncture is practiced in many Asian countries. The community model hearkens back to how acupuncture was originally meant to be practiced. Plus, it demystifies the process by bringing it out into the open.    Community Acupuncture Options in the [place] Area:     [facility]:   [email or web address]  [address] [place], [place] [address]  [phone]     [facility]  [email or web address]  [address]   [place], [place] [address]  [phone]    [facility]uncture  [email or web address]  [address] [place], [address]  [place], [place] [address]  [phone]    [facility]   [email or web address]  [address] [place], [place] [address]  [phone]    [facility]  [address] [place], [place], [address]  [phone]    [facility]  [email or web address]  [address]  [place], [place] [address]  [phone]    [facility]uncture  [email or web address]  [address]  [place], [place]  [address]  [phone]    [facility] [place]  [address]  [place] [address]   [phone]    [facility]  [facility]   [email or web address]  [address]   [place]   [phone]    [facility]  [email or web address]  [address]   [place], [place] [address]  [phone]  _____    Jon Billings M.D., L.Ac.  Medical Specialties  Primary care  Family medicine  Integrated holistic  medicine  Acupuncture  Telehealth services  [email or web address]

## 2021-04-01 ENCOUNTER — Encounter (INDEPENDENT_AMBULATORY_CARE_PROVIDER_SITE_OTHER): Payer: Self-pay | Admitting: Internal Medicine

## 2021-04-01 ENCOUNTER — Ambulatory Visit (INDEPENDENT_AMBULATORY_CARE_PROVIDER_SITE_OTHER): Payer: Medicare PPO

## 2021-04-01 ENCOUNTER — Ambulatory Visit (INDEPENDENT_AMBULATORY_CARE_PROVIDER_SITE_OTHER): Payer: Medicare PPO | Admitting: Internal Medicine

## 2021-04-01 VITALS — BP 166/92 | HR 80 | Temp 98.1°F

## 2021-04-01 DIAGNOSIS — L309 Dermatitis, unspecified: Secondary | ICD-10-CM

## 2021-04-01 DIAGNOSIS — M792 Neuralgia and neuritis, unspecified: Secondary | ICD-10-CM

## 2021-04-01 DIAGNOSIS — Z23 Encounter for immunization: Secondary | ICD-10-CM

## 2021-04-01 DIAGNOSIS — Z79899 Other long term (current) drug therapy: Secondary | ICD-10-CM

## 2021-04-01 DIAGNOSIS — Z1211 Encounter for screening for malignant neoplasm of colon: Secondary | ICD-10-CM

## 2021-04-01 DIAGNOSIS — Z0000 Encounter for general adult medical examination without abnormal findings: Secondary | ICD-10-CM

## 2021-04-01 DIAGNOSIS — Z1212 Encounter for screening for malignant neoplasm of rectum: Secondary | ICD-10-CM

## 2021-04-01 DIAGNOSIS — I10 Essential (primary) hypertension: Secondary | ICD-10-CM

## 2021-04-01 MED ORDER — PREGABALIN 300 MG OR CAPS
300.0000 mg | ORAL_CAPSULE | Freq: Two times a day (BID) | ORAL | 1 refills | Status: DC
Start: 2021-04-01 — End: 2021-07-23

## 2021-04-01 MED ORDER — COVID-19 MRNA MONOVALENT VACCINE (PFIZER) 30 MCG/0.3ML IM (WRAPPER)
30.0000 ug | Freq: Once | INTRAMUSCULAR | Status: AC
Start: 2021-04-01 — End: 2021-04-01
  Administered 2021-04-01: 30 ug via INTRAMUSCULAR

## 2021-04-01 MED ORDER — BETAMETHASONE DIPROPIONATE 0.05 % EX LOTN
TOPICAL_LOTION | Freq: Two times a day (BID) | CUTANEOUS | 11 refills | Status: DC
Start: 2021-04-01 — End: 2021-07-23

## 2021-04-01 NOTE — Patient Instructions (Signed)
Will get second shingrix at pharmacy

## 2021-04-01 NOTE — Progress Notes (Addendum)
ANNUAL WELLNESS VISIT    Ronnie Mason is a 70 year old male who presents for an Annual Wellness Visit.   []  Initial   [x]  Subsequent        INFORMATION GATHERING:   The following areas were confirmed with patient/caregiver and/or updated in Epic at this clinic visit      Past Medical History   Past Surgical History   Family History   Social History    Current medications and supplements (including vitamins and calcium)   Allergies  [x]  All of the above components have been reviewed and updated.     Opioid Use:    []  Reviewed    The information below is up to date at the end of this visit:   Review of functional ability, hearing, fall risk and safety, diet, physical activity, and health habits  (via HRA or direct review with patient or caregiver)     The Health Risk Assessment (HRA) for today's visit was completed:    []  as Patient-Entered Questionnaire via eCare     []  as paper HRA form, completed by or with the patient or caregiver      [x]  in HRA template documentation, completed by staff or provider with patient or caregiver at visit, pt declined to answer several questions      List of current providers and suppliers:     [x]   See Care Team Section   [x]   See EHR Encounters for [facility] Medicine Providers involved in care   []   Other providers and suppliers outside [facility]    Depression screening:    PHQ-2:  pt declined due to time  PHQ-9 (if done):       EXAM: See MD note today for vitals     GAIT:    [x]  Normal, stable, independent    []  Abnormal (describe):    As assessed by:     [x]  Direct Observation     []  Timed Up and Go     []  Other:  COGNITION:    [x]  Intact     []  Abnormal (describe):     As assessed by:      [x]  Direct Observation     []  Brief Cognitive Screen          ADVANCE CARE PLANNING (ACP)     Advance care planning:   []  Patient Accepted   [x]  Patient Declined     Check all that apply:  []  Advance directives are on file in his chart  []  Explained & discussed advance directives at this  visit  []  Completed advance care planning form(s) at this visit  []  Other Advance Care Planning discussion at this visit (describe):       Time Spent on ACP: [x]  None       []  1-15 minutes      []  16-30 minutes      []  >30 minutes      ASSESSMENT:       Ronnie Mason was seen for his Annual Wellness Visit, including identification of risk factors & conditions that may affect his health and function in the future.            Actions at this visit:         Establishing or updating a written schedule of screening and prevention measures recommended and appropriate for Ronnie Mason for the next 5-10 years   Establishing or updating a list of his risk factors and conditions  for which lifestyle or medical interventions are recommended or underway, including mental health risks and conditions, and including risks/benefits of treatment   Furnishing personalized health advice and, as appropriate, referrals to health education or preventive counseling services or programs (such as fall prevention, tobacco cessation, physical activity, nutrition, weight loss)    [x]  All of the above components have been reviewed and updated.     COUNSELING:      Based on todays evaluation, I recommend the following ways to improve your health or functioning.    You have the following risk factors and/or medical conditions for which there are recommended ways (included in this list) to help you stay as healthy as possible:  1. Mild vascular neurocognitive disorder  2. Traumatic Brain Injury  3. Chronic renal insufficiency  4. History of cellulitis and abscess of foot  5. Essential hypertension  6. Chronic pain     Recommendations  1. Continue taking medications as prescribed  2. Follow up with pain medicine specialist  3. Follow up with Chronic Pain Coping Skills an dGentle Movement Class   4. Obtain flu vaccine annually  5. Obtain shingles vaccine at your local pharmacy  6. Obtain routine dental exam and cleaning  7. Obtain  routine eye exam  8. Maintain a healthy diet with three meals a day and adequate sources of protein  9. Continue to get regular physical activity, pacing yourself as necessary  10. Continue following up with Physical Therapy   11. Discuss colorectal cancer screening with Dr at next visit  12. Please plan to have a Subsequent Annual Wellness Visit in 1 year.    Here are screening & prevention measures recommended for you:  Health Maintenance   Topic Date Due    Zoster Vaccine (3 of 3) 07/22/2020    Colorectal Cancer Screening  09/24/2020    COVID-19 Vaccine (4 - Booster for Pfizer series) 11/09/2020    Medicare Annual Wellness Visit  03/01/2021    Influenza Vaccine (1) 05/03/2021    Lipid Disorders Screening  01/15/2022    Depression Screening (PHQ-2)  02/05/2022    Diabetes Screening  12/06/2023    DTaP, Tdap, and Td Vaccines (5 - Td or Tdap) 12/07/2030    Pneumococcal Vaccine: 65+ years  Completed    Hepatitis C Screening  Completed    Hepatitis A Vaccine  Aged Out    Hepatitis B Vaccine  Aged Out     Pt declined printed AWV, will review via MyChart

## 2021-04-01 NOTE — Patient Instructions (Signed)
Based on todays evaluation, I recommend the following ways to improve your health or functioning.    You have the following risk factors and/or medical conditions for which there are recommended ways (included in this list) to help you stay as healthy as possible:  Mild vascular neurocognitive disorder  Traumatic Brain Injury  Chronic renal insufficiency  History of cellulitis and abscess of foot  Essential hypertension  Chronic pain     Recommendations  Continue taking medications as prescribed  Follow up with pain medicine specialist  Follow up with Chronic Pain Coping Skills an dGentle Movement Class   Obtain flu vaccine annually  Obtain shingles vaccine at your local pharmacy  Obtain routine dental exam and cleaning  Obtain routine eye exam  Maintain a healthy diet with three meals a day and adequate sources of protein  Continue to get regular physical activity, pacing yourself as necessary  Continue following up with Physical Therapy   Discuss colorectal cancer screening with Dr Marisa Sprinkles at next visit  Please plan to have a Subsequent Annual Wellness Visit in 1 year.    Here are screening & prevention measures recommended for you:  Health Maintenance   Topic Date Due    Zoster Vaccine (3 of 3) 07/22/2020    Colorectal Cancer Screening  09/24/2020    COVID-19 Vaccine (4 - Booster for Pfizer series) 11/09/2020    Medicare Annual Wellness Visit  03/01/2021    Influenza Vaccine (1) 05/03/2021    Lipid Disorders Screening  01/15/2022    Depression Screening (PHQ-2)  02/05/2022    Diabetes Screening  12/06/2023    DTaP, Tdap, and Td Vaccines (5 - Td or Tdap) 12/07/2030    Pneumococcal Vaccine: 65+ years  Completed    Hepatitis C Screening  Completed    Hepatitis A Vaccine  Aged Out    Hepatitis B Vaccine  Aged Out

## 2021-04-01 NOTE — Progress Notes (Signed)
COVID-19 Vaccine Intake Documentation      Pre-Vaccination Screening Questions:         1.  Are you feeling sick today?       NO       2. Have you ever received a dose of COVID-19 vaccine?     YES         If yes, which vaccine product?   Pfizer         3.  Have you  ever had a severe allergic reaction    (e.g., anaphylaxis) to something?  For example, a reaction for    which you were treated with epinephrine or Epi Pen  or    for which you had to go to the hospital?     If yes, please review questions below:       NO        Was the severe allergic reaction after receiving a COVID-19 vaccine?      NO      Was the severe allergic reaction after receiving another vaccine or another injectable medication?     NO      4.  Does the patient weigh 66 pounds or less?   No   5. For pediatric patients, has your child ever been diagnosed with MIS-C (multisystem inflammatory disease) related to Covid-19? No       If so, have they recovered completely AND has it been more than 90 days since their diagnosis?   No     Per orders of Dr. Marisa Sprinkles, injection of Pfizer given by Lafe Garin, CMA. Patient instructed to remain in clinic for 20 minutes afterwards, and to report any adverse reaction to me immediately.      Medication Administrations This Visit       COVID-19 mRNA vaccine (Pfizer) injection 30 mcg Admin Date  04/01/2021  10:28 Action  Given Dose  30 mcg Route  Intramuscular Site  Left Deltoid Administered By  Lafe Garin, CMA    Ordering Provider: [name], MD    NDC: [phone]    Lot#: [id number]          Vaccine given today without initial adverse effect. YES    Lafe Garin, CMA

## 2021-04-01 NOTE — Progress Notes (Signed)
Ronnie Mason is a 70 year old male       BP (!) 166/92    Pulse 80    Temp 36.7 C (Temporal)    SpO2 97%   Chief Complaint   Patient presents with    Follow-Up      3 month follow up on MRSA infection in foot. Visited [facility] yesterday.       Patient seen By self     HPI  History of:    pcp Ronnie Mason    History of neuropathy  History of back pain  History of right foot infection subsequent   Past alcohol consumption    Patient Care Team:  Ronnie Mason as PCP - General (Internal Medicine)  Ronnie Mason as Cardiologist  (Cardiology)  Ronnie Mason (Sports Medicine)  Ronnie Mason (Gastroenterology)  Ronnie Mason (General Surgery)  Ronnie Mason (Urology)  Ronnie Mason (Podiatry)  Ronnie Mason as Primary Contact (Pain Management)  Ronnie Mason as Surgeon (Otolaryngology)       Today follow-up above issues:     Doing well on lyrica no side effect   He reports he is being followed with some counseling psychotherapy type treatment because of his pain he is excited about this   History of rather terrible foot infection on the right side of his foot when he used his foot on a shovel with his boot on and it got infected   Has alcohol he said he is now drinking 2 glasses of drink at the most at night and not every night        Orders Only on 01/15/21   1. LIPID PANEL   Result Value Ref Range    Cholesterol (Total) 202 (H) <200 mg/dL    Triglyceride 751 (H) <150 mg/dL    Cholesterol (HDL) 025 >39 mg/dL    Cholesterol (LDL) 68 <130 mg/dL    Non-HDL Cholesterol 99 0 - 159 mg/dL    Cholesterol/HDL Ratio 2.0     Lipid Panel, Additional Info. (NOTE)        No results found.    MEDICATION PRIOR TO VISIT  Reports   Outpatient Medications Prior to Visit   Medication Sig Dispense Refill    Acetaminophen 500 MG Oral Tab Take 1,000 mg by mouth 2 times a day.      Alpha-Lipoic Acid 300 MG tablet Take 1 tablet by  mouth daily.      amLODIPine 5 MG tablet Take 1 tablet (5 mg) by mouth 2 times a day. 180 tablet 3    amoxicillin-clavulanate 875-125 MG tablet Take 1 tablet (875 mg) by mouth 2 times a day. Take 1 tablet (875 mg) by mouth 2 times a day for 10 days. 20 tablet 0    atorvastatin 10 MG tablet take 1 tablet by mouth once daily 90 tablet 3    BENFOTIAMINE OR Take 250 mg by mouth daily.      Cholecalciferol (VITAMIN D3) 2000 units Oral Cap Take 1 capsule (2,000 Units) by mouth daily. For low level 1 capsule 1    Cyanocobalamin 1000 MCG Oral Tab one per day over-the-counter started 5/21 /2012 this level borderline low 1 Tab 1    diclofenac 1 % gel Apply 4 g topically 4 times a day. Apply to 2 gram four times daily to neck, trapezius muscle, and low back. Use a max  of 32 grams a day. 300 g 4    DULoxetine 60 MG DR capsule Take 1 capsule (60 mg) by mouth 2 times a day. 180 capsule 2    EPINEPHrine 0.3 MG/0.3ML auto-injector Inject as instructed per patient package insert, 0.3 mg intramuscularly or subcutaneously into the thigh, if needed to treat anaphylaxis 4 each 1    HYDROcodone-acetaminophen 5-325 MG tablet Take 1 tablet by mouth every 8 hours as needed for severe pain. 20 tablet 0    ibuprofen 200 MG tablet Take 200 mg by mouth daily as needed (pain).      lidocaine 5 % patch Apply 1 patch onto the skin daily. Apply to painful area for up to 12 hours in a 24 hour period. (Patient taking differently: Apply 1 patch onto the skin daily as needed. Apply to painful area for up to 12 hours in a 24 hour period.) 30 patch 5    lisinopril 20 MG tablet Take 1 tablet (20 mg) by mouth every 12 hours. 180 tablet 1    meloxicam 15 MG tablet take 1 tablet by mouth once daily 20 tablet 1    Omega-3 Fatty Acids (OMEGA 3 OR) Take 1 tablet by mouth daily.      pregabalin 300 MG capsule Take 1 capsule (300 mg) by mouth 2 times a day. 60 capsule 5    primidone 50 MG tablet Take 1 tablet (50 mg) by mouth 2 times a day. 180  tablet 4     Facility-Administered Medications Prior to Visit   Medication Dose Route Frequency Provider Last Rate Last Admin    iohexol (Omnipaque) 240 MG/ML contrast injection 10 mL  10 mL See Admin Instructions Once Ronnie Mason             []  []  Male: GU /testicular:         PHYSICAL Exam    BP (!) 166/92    Pulse 80    Temp 36.7 C (Temporal)    SpO2 97%     Physical Exam   is not in acute distress  His blood pressure however is elevated  Heart regular rate and rhythm no murmurs  Lungs clear to auscultation  No leg edema scar at the bottom of his right foot      IMPRESSION / PLAN / DISCUSSION   (M79.2) Neuropathic pain  (primary encounter diagnosis)  (Z23) Need for zoster vaccination  (Z12.11,  Z12.12) Screening for colorectal cancer  (Z23) Need for COVID-19 vaccine  (L30.9) Dermatitis  (Z79.899) Controlled substance agreement signed  (I10) Essential hypertension    Ronnie Mason was seen today for follow-up .    Diagnoses and all orders for this visit:    Neuropathic pain  -     pregabalin 300 MG capsule; Take 1 capsule (300 mg) by mouth 2 times a day.  -     Chronic Pain Drug Risk 1 w/Cnslt, Urine; Future    Need for zoster vaccination    Screening for colorectal cancer  -     Referral to Gastroenterology-Clinic; Future    Need for COVID-19 vaccine  -     COVID-19 mRNA vaccine (Pfizer) injection 30 mcg    Dermatitis  -     betamethasone dipropionate 0.05 % lotion; Apply topically 2 times a day. Apply to scalp and ears as needed for itching    Controlled substance agreement signed    Essential hypertension    Other orders  -  ZEBRA LABELS      Chronic back pain and he said that the branch block did not help on the left and hence he will be seeing psychiatric pain clinic for a an 8-week session    Refill his Lyrica-he notes that this is quite helpful but as it is controlled substance pain contract  I doubt he is doing any drugs his medication has always been alcohol problem and he said that this is in much  better control    Blood pressure is an issue  Repeated readings are the same we will asked that he follow-up with home blood pressure readings on telehealth in 1 month    AW visit with my nurse     I told him that part of his problem has always been not making good choices  And in the future he should not be using his foot in such a harsh manner especially when he has neuropathy    His answer to this was if he does not do it the nobody woud  My answer to this is that if he keeps on doing it he will not have a foot  And that 1 can only push the body so far    I refilled his steroid cream he uses it for his scalp and ear dermatitis which seems to work for him  Nothing is visualized today        Discussed hm due along with other issues above   Health Maintenance Topics with due status: Overdue       Topic Date Due    Chronic Opioid: Initial Evaluation Note Never done    Zoster Vaccine 07/22/2020    Chronic Opioid: Controlled Substances Agreement 07/26/2020    Chronic Opioid: Urine Drug Screen 09/11/2020    Colorectal Cancer Screening 09/24/2020    COVID-19 Vaccine 11/09/2020    Medicare Annual Wellness Visit 03/01/2021         No follow-ups on file.    Patient   express understanding of care plan/medications  Risk of taking medication properly and follow up discussed especially to call if problem    Created with the assistance of voice recognition software dictation

## 2021-04-03 NOTE — Progress Notes (Signed)
[facility] [facility]  [facility]    BEHAVIORAL HEALTH ASSESSMENT:    Ronnie Mason is a 69 year old year old male who was referred by Dr [name] and ARNP Wilmhoff for the following reason:    "REASON FOR REFERRAL: Ronnie Mason is a 69 year old male with chronic pain. Is he candidate for pain psych class."    Per referring provider visit note:    "Ronnie Mason is a 70 year old male with:  1. Right hip pain, Xrays from 2018 reviewed showing severe OA. This has largely resolved currently, minimal to no pain in this area at present.   2. Axial low back pain secondary to facet arthropathy at L4-L5, L5-S1 with prior positive response to RFA (last in 2017) as well as lumbar facet arthropathy. Continue to suspect a significant component of myofascial pain given exacerbation with movement, reproducible tenderness to palpation. Also concern for nociplastic pain/central sensitization overlying multiple somatic pain issues given diffuse pain symptoms and likely hyperalgesia.   3. Lower back pain with radiation into bilateral lower extremities radicular in nature. Notes less radicular pain at this time, primarily axial in nature, however does have prior evidence of disc bulge on imaging.   4. Deconditioning- Recent right foot surgery. PT currently on hold.   5. Prior treatment failures including: Short duration of benefit for interlaminar lumbar epidural steroids and TFESI, limited benefit to lumbar MBB recently, memantine, Naltrexone.   6.  Multiple prior injections by outside providers ( trigger point injections):  · Dr. Liem  ? 03/23/2018 - LEFT L5 TFESI with 80-85% relief for about 3-4 weeks of just axial back pain.   ? 09/15/2017 - Bilateral L5 TFESI with 50-75% relief of mixture of low back and leg pain.   ? 05/19/2017 - RIGHT L5 TFESI with 100% relief of Radicular symptoms for about 3 months  ? 08/12/2016 - L5-S1 ILESI  ? 07/01/2016 - Ultrasound guided right trochanteric bursa  injection   ? 05/20/2016 - Bilateral shoulder injections  ? 05/13/2016 - Right L3 L4 L5/S1 RFA   ? 02/19/2016 -  Left L3, L4, L5/S1 RFA   ? 12/27/2012 - L5-S1 IL ESI  ? 08/31/2012 - Left L5 TFESI   ? 03/30/2012 - Left L4/5, L5/S1 facet inj  ? 02/10/2012 - Left C6 C7 MB RFA  ? 07/03/2011 - Left L3, L4, L5/S1 RFA  ? 05/12/2011 - Left C4, C5, C6 RFA  7. Medical comorbidities significant for multiple pain syndromes (neck pain, shoulder pain previously, groin/hip pain, prior greater trochanteric pain syndrome, R foot fracture, R lateral malleolus fracture), social hx positive for alcohol use disorder recently relapsed and in treatment, hx of possible NPH, hx of spontan intraparenchymal intracran bleed (including thalamus), mild TBI, among others     PLAN:    My impressions and treatment recommendations were discussed in detail with the patient who had no further questions before leaving the office today.     --Continue PT and pool workouts/walking  --Referral to pain psych class.  --Medications unchanged.  --Discussed the use of myofascial release balls (ie tennis balls)  --Follow up with Dr. [name] in 6 months.     Joseph Wilmhoff spent 40 minutes on the patient's care."     Confidentiality and limitations of confidentiality were reviewed and an opportunity to ask questions was provided. The patient was informed that the results of the clinical interview would be written as a report that is available through [facility] [employer] [facility]’s EPIC medical records system.  The patient was notified that any provider who would normally have access to her medical records would have access to this report. They expressed their understanding   of these facts and received a copy of writer's Counselor Disclosure Statement via [place].       Service Time (minutes): 50  Seen in Clinic: YES  Other Location: N/A       HISTORY OF PRESENTING PROBLEM:  Patient reports onset on LBP in 1985 when he was weight lifting for racquetball.   Pain previously treated with opioids.  Reports pattern of higher risk drinking when opioids were stopped.  Alcohol use treated with natrexone/harm reduction approach in 2022.  Patient of [facility] since 2014 to address axial LBP (lumbar) with bilateral radiation.  Additionally has right hip pain related to OA and bursitis.  Currently engaged in PT.  Reports being in PT "for years" to assist with back and foot due to R lateral malleolus fracture.  Denies history of depression, anxiety, or major mental health condition.  No history of counseling.  Reports some weight game over the past two year that has negatively impacted pain experience.            EXACERBATING FACTORS:  Exacerbating factors for the patients pain include turning/twisting/bending movements, standing straight, and walking extended distances    ALLEVIATING FACTORS:   The patient reports that they have sought to alleviate pain through heat/ice, duloxetine, and gabapentin     PAIN BELIEFS  The patient attributes their pain to "daily life"  The patient attributes their pain to a specific injury or accident.  The patient denies concerns about their pain signaling a dangerous or undiagnosed underlying pathology.    The patient has concerns about increased risks of physical injury or permanent worsening of her pain condition through continued physical exertion.  The patient does not believes medical treatments for his lower back have been effective.  The patient does not believe the pain originated during a stressful period of their life.    SOCIAL HISTORY:   Marital status: married  Children and dependents: 2 adult children, 3 grandchildren  Family functioning: supportive  Living situation: stable  Employment: retired, previously worked as an Radio broadcast assistant status: basic needs met  Education: Marine scientist: no  Religion and culture factors: denies  Avocations/recreation: pool walking  Childhood history: grew up in [place] within cultural  norms  Trauma History: denies    MENTAL HEALTH HISTORY:   Current mood symptoms of concern: Anger/irritability and Chronic pain  Mania/Hypomania:  Patient denies/none observed  Psychotic Symptoms (Paranoia, A/V Hallucinations, Delusions): Patient denies/none observed  OCD: Patient denies/none observed  Previous outpatient treatment: NO.   Hospitalizations: None  Past suicide attempts: None   Suicidal/Homicidal Ideation: patient does not endorse current or recent active/passive suicidal ideation, intent, means, or plan. Patient does not endorse current or recent active/passive homicidal ideation; does not endorse plan, means, target, or intent.    ALCOHOL & DRUG HISTORY:   Tobacco: denies  Alcohol use: started drinking in 8th grade, developed higher risk alcohol use when opioids tapered.  Recently stopped naltrexone.  Currently drinking 2-3 glasses of wine per night  Other substance use: Denies   Currently in treatment? NO.   History of SUD treatment? medication assisted alcohol treatment in 2022.        MENTAL STATUS:   General appearance: casually dressed  Behavior/Activity: calm and cooperative  Speech: clear, coherent, fluent.  Affect: normal affect  Mood: normal mood.    Thought form: normal: logical, coherent, goal-directed.  Thought content: normal  Attention/concentration: alert   Memory: age appropriate  Insight/Judgment: good insight and judgment  ?   ?  ASSESSMENT:    ?   Patient Active Problem List   Diagnosis   • Syncope   • Hypotension   • Carotid Sinus Hypersensitivity   • URI (upper respiratory infection)   • Cervicalgia   • Hyperlipidemia   • Chronic pain   • Bee sting allergy   • Numbness and tingling of leg   • Chronic back pain   • B12 deficiency   • Chronic renal insufficiency, stage III (moderate) (HCC)   • Back pain, lumbosacral   • Lumbar radiculopathy, chronic   • Neck pain   • Fall   • Tremor   • Sensory neuropathy   • 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 myelopathy or radiculopathy   •  Idiopathic peripheral neuropathy   • 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)   • History of alcohol dependence (HCC)   • Cerebral ventriculomegaly   • Cerebellar hypoplasia (HCC)   • Essential hypertension   • Essential tremor   • Hx of spontan intraparenchymal intracran bleed assoc with hypertension   • Alcohol abuse   • Intraparenchymal hemorrhage of brain (HCC)   • 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)   • 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)   • Balance problem   • Chronic pain of both shoulders   • Vitamin D deficiency   • Difficulty in walking, not elsewhere classified   • Impaired mobility and ADLs   • 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(HCC)   • Primary osteoarthritis of right hip   • Mild vascular neurocognitive disorder (HCC)   • Right lumbar radiculitis   • Greater trochanteric pain syndrome of right lower extremity   • Complex tear of medial meniscus of left knee as current injury   • Primary osteoarthritis of left knee   • Acute left-sided low back pain without sciatica   • Chronic left-sided low back pain without sciatica   • Enuresis   • Closed fracture of one rib of right side   • MRSA (methicillin resistant staph aureus) culture positive   • Myofascial pain   • Macrocytosis without anaemia   • Cellulitis and abscess of foot     ?   Outpatient Medications Prior to Visit   Medication Sig Dispense Refill   • Acetaminophen 500 MG Oral Tab Take 1,000 mg by mouth 2 times a day.     • Alpha-Lipoic Acid 300 MG tablet Take 1 tablet by mouth daily.     • amLODIPine 5 MG tablet Take 1 tablet (5 mg) by mouth 2 times a day. 180 tablet 3   • amoxicillin-clavulanate 875-125 MG tablet Take 1 tablet (875 mg) by mouth 2 times a day. Take 1 tablet (875 mg) by mouth 2 times a day for 10 days. 20 tablet 0   • atorvastatin 10 MG tablet take 1 tablet by mouth once daily 90 tablet 3   • BENFOTIAMINE OR Take 250 mg by mouth daily.     • Cholecalciferol (VITAMIN D3) 2000 units Oral Cap Take 1  capsule (2,000 Units) by mouth daily. For low level 1 capsule 1   • Cyanocobalamin 1000 MCG Oral Tab one per day over-the-counter started 5/21 /2012 this level borderline low 1 Tab 1   • diclofenac 1 % gel Apply 4 g topically 4 times a day. Apply to 2 gram four times daily to neck, trapezius muscle, and low back. Use a max of 32 grams a day. 300 g 4   •   DULoxetine 60 MG DR capsule Take 1 capsule (60 mg) by mouth 2 times a day. 180 capsule 2    EPINEPHrine 0.3 MG/0.3ML auto-injector Inject as instructed per patient package insert, 0.3 mg intramuscularly or subcutaneously into the thigh, if needed to treat anaphylaxis 4 each 1    HYDROcodone-acetaminophen 5-325 MG tablet Take 1 tablet by mouth every 8 hours as needed for severe pain. 20 tablet 0    ibuprofen 200 MG tablet Take 200 mg by mouth daily as needed (pain).      lidocaine 5 % patch Apply 1 patch onto the skin daily. Apply to painful area for up to 12 hours in a 24 hour period. (Patient taking differently: Apply 1 patch onto the skin daily as needed. Apply to painful area for up to 12 hours in a 24 hour period.) 30 patch 5    lisinopril 20 MG tablet Take 1 tablet (20 mg) by mouth every 12 hours. 180 tablet 1    meloxicam 15 MG tablet take 1 tablet by mouth once daily 20 tablet 1    Omega-3 Fatty Acids (OMEGA 3 OR) Take 1 tablet by mouth daily.      pregabalin 300 MG capsule Take 1 capsule (300 mg) by mouth 2 times a day. 60 capsule 5    primidone 50 MG tablet Take 1 tablet (50 mg) by mouth 2 times a day. 180 tablet 4     Facility-Administered Medications Prior to Visit   Medication Dose Route Frequency Provider Last Rate Last Admin    iohexol (Omnipaque) 240 MG/ML contrast injection 10 mL  10 mL See Admin Instructions Once Vic Blackbird, MD         ?   Patient maintains limited pain coping skills and would benefit from the Chronic Pain Coping Skills Class that starts in September at [facility]  This class will cover an overview of pain neuroscience, immune  system and pain, myofascial and muscle pain, movement and pain, desensitization training, review of time-based activity pacing, relaxation practice, addressing unhelpful thinking styles, reducing rumination/worry, pain related thoughts and behavior, goal setting and achievement, connecting with core values, increasing self-efficacy, sleep hygiene and behaviors that affect sleep, practicing acceptance, clean vs. dirty pain, somatic tracking practice, cost/benefit exercise, creative hopelessness, and mindfulness practice.  Patient open to group modality and presents as suitable for class.    VISIT DIAGNOSISES  (F54) Psychological factors affecting medical condition  (primary encounter diagnosis)  (M54.50,  G89.29) Chronic bilateral low back pain without sciatica      PLAN:   Harless Molinari will schedule with the Chronic Pain Coping Skills and Gentle Movement class      Louellen Molder, MSW, [place] [facility]  [phone]

## 2021-04-12 ENCOUNTER — Encounter (INDEPENDENT_AMBULATORY_CARE_PROVIDER_SITE_OTHER): Payer: Self-pay | Admitting: Podiatrist

## 2021-04-14 NOTE — Telephone Encounter (Signed)
appt made

## 2021-04-16 ENCOUNTER — Ambulatory Visit (INDEPENDENT_AMBULATORY_CARE_PROVIDER_SITE_OTHER): Admit: 2021-04-16 | Discharge: 2021-04-16 | Disposition: A | Discharge status: Home / Self Care | Payer: Medicare PPO

## 2021-04-16 ENCOUNTER — Ambulatory Visit (INDEPENDENT_AMBULATORY_CARE_PROVIDER_SITE_OTHER): Payer: Medicare PPO | Admitting: Podiatrist

## 2021-04-16 ENCOUNTER — Other Ambulatory Visit (INDEPENDENT_AMBULATORY_CARE_PROVIDER_SITE_OTHER): Payer: Self-pay | Admitting: Internal Medicine

## 2021-04-16 ENCOUNTER — Encounter (INDEPENDENT_AMBULATORY_CARE_PROVIDER_SITE_OTHER): Payer: Self-pay

## 2021-04-16 DIAGNOSIS — M19079 Primary osteoarthritis, unspecified ankle and foot: Secondary | ICD-10-CM

## 2021-04-16 DIAGNOSIS — M722 Plantar fascial fibromatosis: Secondary | ICD-10-CM

## 2021-04-16 DIAGNOSIS — Z91030 Bee allergy status: Secondary | ICD-10-CM

## 2021-04-16 DIAGNOSIS — M205X1 Other deformities of toe(s) (acquired), right foot: Secondary | ICD-10-CM

## 2021-04-16 DIAGNOSIS — L905 Scar conditions and fibrosis of skin: Secondary | ICD-10-CM

## 2021-04-16 NOTE — Progress Notes (Signed)
Patient: Ronnie Mason   Patient DOB: [birth year]     DOS:  04/16/2021     Accompanied by:  spouse    Chief Complaint: [name] is a 70 year old year old male who is former patient presents today with new complaint(s):  Lump on right plantar arch.     History of present Illness:    (history is obtained by MA/RN and edited by [name])  Ronnie Mason is a 70 year old year old male who presents today for re-evaluation of Right foot, 4 months s/p wound closure, dos:12/09/20.    The patient states recent swelling and pain at times, mostly scar tissue in arch getting bigger    The current condition has existed for long time .    Patient describes the symptoms as swelling.     The level the pain moderate.    The condition is worse when walking.     Current treatments/recent studies include ice, elevation, PT  The response to the treatment is none.     Patient's activity limitation is limited in walking.       Goals: evaluate current condition    PCP:  [name], MD     PMH:   Past Medical History:   Diagnosis Date    Pain of right hip joint 01/23/2016    Intraparenchymal hemorrhage of brain (HCC) 2017    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 myelopathy or radiculopathy 05/28/2014    Allergic rhinitis due to allergen     Carotid Sinus Hypersensitivity     Disc disorder of lumbosacral region     Dizziness     Heart murmur     Hip injury     HYPERTENSION      HYPOTENSION      Nosebleed     Strep throat     Stroke [facility])     Syncope     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(HCC)     URI (upper respiratory infection)         Review of patient's allergies indicates:  Allergies   Allergen Reactions    Adhesives Skin: Rash     Glue on lido patches    Bee Venom Skin: Hives, Skin: Itching and Swelling    Gabapentin Other     Flu like symptoms, diarrhea, body ache upset stomach    Methocarbamol Other     Patient's wife reports cognitive difficulties ("goofy")        Medications:   Outpatient  Medications Prior to Visit   Medication Sig Dispense Refill    Acetaminophen 500 MG Oral Tab Take 1,000 mg by mouth 2 times a day.      Alpha-Lipoic Acid 300 MG tablet Take 1 tablet by mouth daily.      amLODIPine 5 MG tablet Take 1 tablet (5 mg) by mouth 2 times a day. 180 tablet 3    amoxicillin-clavulanate 875-125 MG tablet Take 1 tablet (875 mg) by mouth 2 times a day. Take 1 tablet (875 mg) by mouth 2 times a day for 10 days. 20 tablet 0    atorvastatin 10 MG tablet take 1 tablet by mouth once daily 90 tablet 3    BENFOTIAMINE OR Take 250 mg by mouth daily.      betamethasone dipropionate 0.05 % lotion Apply topically 2 times a day. Apply to scalp and ears as needed for itching 60 mL 11    Cholecalciferol (VITAMIN D3) 2000 units Oral Cap  Take 1 capsule (2,000 Units) by mouth daily. For low level 1 capsule 1    Cyanocobalamin 1000 MCG Oral Tab one per day over-the-counter started 5/21 /2012 this level borderline low 1 Tab 1    diclofenac 1 % gel Apply 4 g topically 4 times a day. Apply to 2 gram four times daily to neck, trapezius muscle, and low back. Use a max of 32 grams a day. 300 g 4    DULoxetine 60 MG DR capsule Take 1 capsule (60 mg) by mouth 2 times a day. 180 capsule 2    EPINEPHrine 0.3 MG/0.3ML auto-injector Inject as instructed per patient package insert, 0.3 mg intramuscularly or subcutaneously into the thigh, if needed to treat anaphylaxis 4 each 1    ibuprofen 200 MG tablet Take 200 mg by mouth daily as needed (pain).      lidocaine 5 % patch Apply 1 patch onto the skin daily. Apply to painful area for up to 12 hours in a 24 hour period. (Patient taking differently: Apply 1 patch onto the skin daily as needed. Apply to painful area for up to 12 hours in a 24 hour period.) 30 patch 5    lisinopril 20 MG tablet Take 1 tablet (20 mg) by mouth every 12 hours. 180 tablet 1    meloxicam 15 MG tablet take 1 tablet by mouth once daily 20 tablet 1    Omega-3 Fatty Acids (OMEGA 3 OR) Take  1 tablet by mouth daily.      pregabalin 300 MG capsule Take 1 capsule (300 mg) by mouth 2 times a day. 180 capsule 1    primidone 50 MG tablet Take 1 tablet (50 mg) by mouth 2 times a day. 180 tablet 4     Facility-Administered Medications Prior to Visit   Medication Dose Route Frequency Provider Last Rate Last Admin    iohexol (Omnipaque) 240 MG/ML contrast injection 10 mL  10 mL See Admin Instructions Once Alvester Chou, MD            PSH:   Past Surgical History:   Procedure Laterality Date    L meniciscus Left 07/2017    by Dr Marcelle Overlie , re did left menisicus     PR ANES; COLONOSCOPY  2002    repeat in 3 years    PR ANES; COLONOSCOPY & POLYPECTOMY  11/18/2007    repeat in 3 years    PR HALLUX RIGIDUS W/CHEILECTOMY 1ST MP JT W/O IMPLT Right 10/21/2017    Dr. Donn Pierini    PR UNLISTED PROCEDURE FEMUR/KNEE      PR UNLISTED PROCEDURE HANDS/FINGERS      PR UNLISTED PROCEDURE SPINE  2013    rfa to c3 to c 7     TOE SURGERY Right 02/09/2019    toe surgery  Right 01/12/2019    Dr Hilarie Fredrickson         Family History:  family history includes Colon Cancer in his father; Heart (other) in his mother; Other Family Hx in an other family member. There is no history of Cancer.    Social History:  Social History     Socioeconomic History    Marital status: Married     Spouse name: Not on file    Number of children: Not on file    Years of education: Not on file    Highest education level: Not on file   Occupational History    Not on file   Tobacco Use  Smoking status: Never Smoker    Smokeless tobacco: Former Estate agent and Sexual Activity    Alcohol use: Yes     Alcohol/week: 14.0 standard drinks     Types: 14 Glasses of wine per week     Comment: goal to get down to 2 per day    Drug use: Yes     Types: Marijuana     Comment: CBD and THC    Sexual activity: Not on file   Other Topics Concern    Not on file   Social History Narrative    09/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  Non-smoker, 14 or more drinks/week almost exclusively wine with dinner.            Social Determinants of Health     Financial Resource Strain: Not on file   Food Insecurity: Not on file   Transportation Needs: Not on file   Physical Activity: Not on file   Stress: Not on file   Social Connections: Not on file   Intimate Partner Violence: Not on file   Housing Stability: Not on file        ROS is positive for musculoskeletal as mentioned in the HPI and as stated in the past medical history.  The patient's review of symptoms is otherwise unremarkable and documented in the electronic health record on 04/16/2021.    Physical Exam:   There were no vitals filed for this visit.     General: The patient is 70 year old year old male White  with constitutional symptom: appears age stated.    Psychiatric/mood: normal mood and affect.    Neurological: alert and oriented x3.    Vascular:   RIGHT:  Dorsalis pedis pulse is palpable.  Posterior tibial pulse is palpable. Capillary filling time note to be immediate on the distal hallux.      LEFT:  Dorsalis pedis pulse is palpable.  Posterior tibial pulse is palpable. Capillary filling time note to be immediate on the distal hallux.      Neurologic:  RIGHT:  The sensation is intact on the foot/ankle.  Motor coordination is intact with normal muscle tone.  There is no manifestations of pathological reflexes noted.     LEFT:  The sensation is intact on the foot/ankle.  Motor coordination is intact with normal muscle tone.  There is no manifestations of pathological reflexes noted.      Dermatological:   RIGHT:   The surgical scar(s) along the dorsal 1st MTPJ and plantar arch are healed with minimal scar.      LEFT:   Upon inspection, the skin note to have normal texture/tone/turgor.  There is no ulceration/open wound or dermatosis present.  Integument coloration is within normal range and temperature is within normal range.  The hair growth is normal.       Musculoskeletal:   RIGHT:  There is approximately 2 cm.soft tissue nodule along the plantar fascia at midarch under prior incision site which there is no pain on palpation.   There is mild hallux mallet toe with stiffness.    LEFT:  There is no pain to palpation, the range of motion within normal limits and pain free, manual muscle testing 5/5 all extrinsic muscles to the foot.  There is no digital deformity noted. There is no forefoot deformity noted.  Patient has rectus foot.      Past radiographic exam (X-RAY) result from Epic imaging.  XR Foot 3 View Right    Result Date: 04/16/2021  EXAMINATION: XR FOOT 3 VW RIGHT CLINICAL INDICATION: right foot pain COMPARISON: Radiographs of the right foot dated January 01, 2021. FINDINGS AND     As before, patient is status post 1st MTP joint arthrodesis, with prior removal of the hardware. There is appropriate progress towards healing without significant change in position or alignment. No complication seen. No fracture. Vascular calcifications are present.    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  We discussed following treatment option i.e. continue current care, steroid injection therapy, surgical excision, current insert modification, custom orthotic and diagnostic test:  MRI.  Answered all of patient's questions.  After the discussion, the patient elected continue current care with Quiktstride modification.      OTC insert MODIFICATION:  Quickstride insert modification was perform by Roe Coombs - Pedorthist which entails plantar fascia groove.      Detailed education and discussion took place with the patient regards to mallet toe deformity of hallux, right.  Discussion included  spectrum of treatments available such as continue with current care, deep toe box/stable shoe and surgical correction.   After the discussion, today patient elected deep toe box/stable shoe.       Following deep toe box shoe was recommended -  New Balance 990, New Balance Fresh Foam More and Brooks Addiction and where to purchase them.        Return appointment: PRN or symptom worsen      [name] DPM, FACFAS  Reconstructive Ankle and Foot Surgeon  [facility]

## 2021-04-16 NOTE — Progress Notes (Signed)
Accompanied by:  wife    Ronnie Mason is a 70 year old year old male who presents today for re-evaluation of Right foot, 4 months s/p wound closure, dos:12/09/20.    The patient states recent swelling and pain at times, mostly scar tissue in arch getting bigger    The current condition has existed for long time .    Patient describes the symptoms as swelling.     The level the pain moderate.    The condition is worse when walking.     Current treatments/recent studies include ice, elevation, PT  The response to the treatment is none.     Patient's activity limitation is limited in walking.

## 2021-04-16 NOTE — Progress Notes (Signed)
Ronnie Mason is here today to see [name].  Please see [name] notes on today's visit.  While he is here today I modified his right orthotic as per [name].  Today I added a fascial accommodation to the right insert.  This was done mainly to the marked area.  I then covered this with a new one 16th inch Spenco top cover.  He will try these over the next 4 to 6 weeks and will follow back with [name] on an as-needed basis.  I spent about 15 minutes in the modification process.

## 2021-04-17 MED ORDER — EPINEPHRINE 0.3 MG/0.3ML IJ SOAJ
INTRAMUSCULAR | 3 refills | Status: DC
Start: 2021-04-17 — End: 2021-07-23

## 2021-04-18 ENCOUNTER — Encounter (HOSPITAL_BASED_OUTPATIENT_CLINIC_OR_DEPARTMENT_OTHER): Payer: Self-pay | Admitting: Psychologist

## 2021-04-18 ENCOUNTER — Other Ambulatory Visit (HOSPITAL_BASED_OUTPATIENT_CLINIC_OR_DEPARTMENT_OTHER): Payer: Self-pay | Admitting: Psychologist

## 2021-04-18 DIAGNOSIS — G8929 Other chronic pain: Secondary | ICD-10-CM

## 2021-04-18 DIAGNOSIS — F54 Psychological and behavioral factors associated with disorders or diseases classified elsewhere: Secondary | ICD-10-CM

## 2021-04-18 DIAGNOSIS — M545 Low back pain, unspecified: Secondary | ICD-10-CM

## 2021-04-21 ENCOUNTER — Encounter (HOSPITAL_BASED_OUTPATIENT_CLINIC_OR_DEPARTMENT_OTHER): Payer: Self-pay

## 2021-04-23 ENCOUNTER — Encounter (HOSPITAL_BASED_OUTPATIENT_CLINIC_OR_DEPARTMENT_OTHER): Payer: Self-pay | Admitting: Psychologist

## 2021-04-24 ENCOUNTER — Encounter (HOSPITAL_BASED_OUTPATIENT_CLINIC_OR_DEPARTMENT_OTHER): Payer: Self-pay

## 2021-04-24 ENCOUNTER — Ambulatory Visit: Payer: Medicare PPO | Attending: Psychologist | Admitting: Rehabilitative and Restorative Service Providers"

## 2021-04-24 DIAGNOSIS — M25512 Pain in left shoulder: Secondary | ICD-10-CM | POA: Insufficient documentation

## 2021-04-24 DIAGNOSIS — M5489 Other dorsalgia: Secondary | ICD-10-CM | POA: Insufficient documentation

## 2021-04-24 DIAGNOSIS — Z8679 Personal history of other diseases of the circulatory system: Secondary | ICD-10-CM | POA: Insufficient documentation

## 2021-04-24 DIAGNOSIS — M25551 Pain in right hip: Secondary | ICD-10-CM | POA: Insufficient documentation

## 2021-04-24 DIAGNOSIS — M545 Low back pain, unspecified: Secondary | ICD-10-CM | POA: Insufficient documentation

## 2021-04-24 DIAGNOSIS — G8929 Other chronic pain: Secondary | ICD-10-CM | POA: Insufficient documentation

## 2021-04-24 DIAGNOSIS — F54 Psychological and behavioral factors associated with disorders or diseases classified elsewhere: Secondary | ICD-10-CM | POA: Insufficient documentation

## 2021-04-24 DIAGNOSIS — I619 Nontraumatic intracerebral hemorrhage, unspecified: Secondary | ICD-10-CM | POA: Insufficient documentation

## 2021-04-24 DIAGNOSIS — R262 Difficulty in walking, not elsewhere classified: Secondary | ICD-10-CM | POA: Insufficient documentation

## 2021-04-24 DIAGNOSIS — M25511 Pain in right shoulder: Secondary | ICD-10-CM | POA: Insufficient documentation

## 2021-04-24 DIAGNOSIS — R2689 Other abnormalities of gait and mobility: Secondary | ICD-10-CM | POA: Insufficient documentation

## 2021-04-24 NOTE — Progress Notes (Signed)
PHYSICAL THERAPY INITIAL EVALUATION    This note serves as a CMS evaluation form that requires the referring provider to certify the need for therapy services furnished under this Plan of Treatment. The signing provider is certifying the plan of care.    GENERAL VISIT INFO    Encounter Diagnoses   Name Primary?    Chronic bilateral low back pain without sciatica     Psychological factors affecting medical condition      Referring Provider: Bethann Punches, PhD  Referring Provider NPI: [id number]  Reason for referral/Medical necessity:to minimize pain and it's functional impairment; to optimize movement and sense of safety in physical activity; participate in movement class and movement exercise activity x3/week.    VISIT COUNT   1    EVALUATION       Based on screening, patient's fall risk is normal.         Past Medical History:   Diagnosis Date    Pain of right hip joint 01/23/2016    Intraparenchymal hemorrhage of brain (HCC) 2017    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 myelopathy or radiculopathy 05/28/2014    Allergic rhinitis due to allergen     Carotid Sinus Hypersensitivity     Disc disorder of lumbosacral region     Dizziness     Heart murmur     Hip injury     HYPERTENSION      HYPOTENSION      Nosebleed     Strep throat     Stroke Ronnie Mason)     Syncope     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(HCC)     URI (upper respiratory infection)        Past Surgical History:   Procedure Laterality Date    L meniciscus Left 07/2017    by Dr Marcelle Overlie , re did left menisicus     PR ANES; COLONOSCOPY  2002    repeat in 3 years    PR ANES; COLONOSCOPY & POLYPECTOMY  11/18/2007    repeat in 3 years    PR HALLUX RIGIDUS W/CHEILECTOMY 1ST MP JT W/O IMPLT Right 10/21/2017    Dr. 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  Non-smoker, 14 or more drinks/week almost exclusively wine with dinner.            [facility] EVALUATION    SUBJECTIVE EXAM    History of Presenting Illness/Injury/Exacerbation:Ronnie Mason notes he hurt his back in 1985 L4-S1 and trying to get it fixed ever since, notes has had injections @ [facility], [place] for years. & they recently sent him to the pain clinic who have recommended this group PT session. Has had regular PT in the community - US/massage weekly & notes it provides temp relief. Randel Pigg to explore this new information & strategy to make a difference to pain. Notes he has some exposure to yoga.    Screening of other regions:deferred    OBJECTIVE EXAM- deferred        ASSESSMENT      Ronnie Mason is  a great candidate for this group movement class, he is an Chief Strategy Officer - weekly for 8 weeks starting Sept 27.    TREATMENT AND PLAN       Plan of Care:attend Coping & Movement class (with Pain CLinic & PT) for 8 weeks (movement class is 60 mins x1/week)    Long-term functional goals: Patient able to :to decrease pain and it's functional impairment by at least 25%; to optimize movement and sense of safety in physical activity; participate in movement class and movement exercise activity x3/week.      Interventions/Treatment provided:  Reviewed the key components of class, discussed the participation/class needs (chair &comfortable clothes, reviewed the logistics of the class format - zoom, shared co-host; provided my contact info &noted email for zoom link will come from Dr Chaney Born group about 1 week prior to start of class on Sept 27.      Interventions for next therapy session:commence group movement class starting Sept 27 (weekly for 8 weeks)      Ronnie Mason, Ronnie Mason

## 2021-04-29 ENCOUNTER — Encounter (HOSPITAL_BASED_OUTPATIENT_CLINIC_OR_DEPARTMENT_OTHER): Payer: Self-pay | Admitting: Psychologist

## 2021-04-29 ENCOUNTER — Ambulatory Visit (HOSPITAL_BASED_OUTPATIENT_CLINIC_OR_DEPARTMENT_OTHER): Payer: Medicare PPO | Admitting: Rehabilitative and Restorative Service Providers"

## 2021-04-29 ENCOUNTER — Ambulatory Visit: Payer: Medicare Other | Attending: Psychologist | Admitting: Psychologist

## 2021-04-29 DIAGNOSIS — M545 Low back pain, unspecified: Secondary | ICD-10-CM

## 2021-04-29 DIAGNOSIS — G8929 Other chronic pain: Secondary | ICD-10-CM | POA: Insufficient documentation

## 2021-04-29 DIAGNOSIS — F54 Psychological and behavioral factors associated with disorders or diseases classified elsewhere: Secondary | ICD-10-CM | POA: Insufficient documentation

## 2021-04-29 NOTE — Progress Notes (Signed)
Group session 1    Date: 04/29/21     Duration: 120 minutes    Number of participants: 12    Facilitator: Karstyn Birkey, PhD    The patient attended the first 2-hour group session of an 8-week cognitive-behavioral therapy-based, self-management course on chronic pain. The patient was provided an overview of the confidentiality of personal information shared during the class and expressed agreement not to divulge clinical information shared by other participants during the pain management course.      SUMMARY OF SESSION CONTENT:  Today’s group session focused on introduction to psychoeducational material and theory regarding the role of psychology in chronic pain, the distinction between acute and chronic pain, and the overlap of neural circuits/body systems for chronic pain, cognitions, and negative emotion. The role of the relaxation/deep breathing in pain management, attention control, and stress response was reviewed. The patient was then guided through breath- and pain-focused meditations.    Homework: The patient was asked to complete a worksheet on gate control theory and practice PMR    Behavioral observations: The patient arrived on time for group session today. They were appropriately dressed and appeared well groomed.  Mood was euthymic with broad affect. They appeared appropriately engaged with the facilitator, group members, and materials throughout the group session.  The patient did not express any suicidal or homicidal ideation, intent, or plans.

## 2021-04-29 NOTE — Progress Notes (Addendum)
PHYSICAL THERAPY TREATMENT NOTE    Distant Site Telemedicine Encounter      I conducted this encounter via secure, live, face-to-face video conference with the patient. I reviewed the risks and benefits of telemedicine as pertinent to this visit and the patient agreed to proceed.    Provider Location: On-site location (clinic, hospital, on-site office)  Patient Location: At home  Present with patient: No one else present       GENERAL VISIT INFO    Encounter Diagnoses   Name Primary?    Psychological factors affecting medical condition Yes    Chronic bilateral low back pain without sciatica      Referring Provider: Bethann Punches, PhD  Referring Provider NPI: [id number]  Reason for referral/Medical necessity:to minimize pain and it's functional impairment; to optimize movement and sense of safety in physical activity; participate in movement class and movement exercise activity x3/week.    VISIT COUNT   2    EVALUATION       Based on screening, patient's fall risk is normal.         Past Medical History:   Diagnosis Date    Pain of right hip joint 01/23/2016    Intraparenchymal hemorrhage of brain (HCC) 2017    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 myelopathy or radiculopathy 05/28/2014    Allergic rhinitis due to allergen     Carotid Sinus Hypersensitivity     Disc disorder of lumbosacral region     Dizziness     Heart murmur     Hip injury     HYPERTENSION      HYPOTENSION      Nosebleed     Strep throat     Stroke [facility])     Syncope     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(HCC)     URI (upper respiratory infection)        Past Surgical History:   Procedure Laterality Date    L meniciscus Left 07/2017    by Dr Marcelle Overlie , re did left menisicus     PR ANES; COLONOSCOPY  2002    repeat in 3 years    PR ANES; COLONOSCOPY & POLYPECTOMY  11/18/2007    repeat in 3 years    PR HALLUX RIGIDUS W/CHEILECTOMY 1ST MP JT W/O IMPLT Right 10/21/2017    Dr. 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            MSK EVALUATION    SUBJECTIVE EXAM    Ronnie Mason was attentive & engaged in our initial group session this afternoon.    ASSESSMENT    Ronnie Mason is a great candidate for this group movement class - weekly for 8 weeks starting Sept 27.    TREATMENT AND PLAN           Plan of Care: attend Coping & Movement class (with Pain CLinic & PT) for 8 weeks (movement class is 60 mins x1/week)  Long-term functional goals: Patient able to : to decrease pain and it's functional impairment by at least 25%; to optimize movement and sense of safety in physical activity; participate in movement class and movement exercise activity x3/week.        Interventions/Treatment provided:  reviewed pain science and biological tissue healing & responses to exercise - see group class materials in attachment below.      Interventions for next therapy session: attend group movement class 2, yoga movement session 1 next week    Ronnie Mason W

## 2021-05-02 ENCOUNTER — Encounter (HOSPITAL_BASED_OUTPATIENT_CLINIC_OR_DEPARTMENT_OTHER): Payer: Self-pay | Admitting: Psychologist

## 2021-05-05 ENCOUNTER — Encounter (INDEPENDENT_AMBULATORY_CARE_PROVIDER_SITE_OTHER): Payer: Self-pay

## 2021-05-06 ENCOUNTER — Ambulatory Visit: Payer: Medicare PPO | Attending: Psychologist | Admitting: Psychologist

## 2021-05-06 ENCOUNTER — Ambulatory Visit: Payer: Medicare PPO | Attending: Psychologist | Admitting: Rehabilitative and Restorative Service Providers"

## 2021-05-06 ENCOUNTER — Encounter (HOSPITAL_BASED_OUTPATIENT_CLINIC_OR_DEPARTMENT_OTHER): Payer: Self-pay | Admitting: Psychologist

## 2021-05-06 DIAGNOSIS — R262 Difficulty in walking, not elsewhere classified: Secondary | ICD-10-CM | POA: Insufficient documentation

## 2021-05-06 DIAGNOSIS — F54 Psychological and behavioral factors associated with disorders or diseases classified elsewhere: Secondary | ICD-10-CM | POA: Insufficient documentation

## 2021-05-06 DIAGNOSIS — M25512 Pain in left shoulder: Secondary | ICD-10-CM | POA: Insufficient documentation

## 2021-05-06 DIAGNOSIS — Z8679 Personal history of other diseases of the circulatory system: Secondary | ICD-10-CM | POA: Insufficient documentation

## 2021-05-06 DIAGNOSIS — M545 Low back pain, unspecified: Secondary | ICD-10-CM | POA: Insufficient documentation

## 2021-05-06 DIAGNOSIS — G8929 Other chronic pain: Secondary | ICD-10-CM | POA: Insufficient documentation

## 2021-05-06 DIAGNOSIS — M5489 Other dorsalgia: Secondary | ICD-10-CM | POA: Insufficient documentation

## 2021-05-06 DIAGNOSIS — I619 Nontraumatic intracerebral hemorrhage, unspecified: Secondary | ICD-10-CM | POA: Insufficient documentation

## 2021-05-06 DIAGNOSIS — M25511 Pain in right shoulder: Secondary | ICD-10-CM | POA: Insufficient documentation

## 2021-05-06 DIAGNOSIS — M25551 Pain in right hip: Secondary | ICD-10-CM | POA: Insufficient documentation

## 2021-05-06 DIAGNOSIS — R2689 Other abnormalities of gait and mobility: Secondary | ICD-10-CM | POA: Insufficient documentation

## 2021-05-06 NOTE — Progress Notes (Signed)
Group session 2    Date: 05/06/21     Duration: 120 minutes    Number of participants: 11    Facilitator: Avary Eichenberger, PhD    The patient attended the 2nd 2-hour group session of an 8-week cognitive-behavioral therapy-based, self-management course on chronic pain. The patient was previously provided an overview of the confidentiality of personal information shared during the class and expressed agreement not to divulge clinical information shared by other participants during the pain management course.      SUMMARY OF SESSION CONTENT:  Today’s group session focused on introduction to psychoeducational material related to the mechanisms behind the neuroscience of chronic pain, the immune systems role in sensitization at peripheral and central nervous system levels, and myofascial pain. The role of the pain neuromatrix in pain perception, modulation of pain perception, and the role of emotion and appraisal in sensitization were reviewed. The role of movement in improving blood flow and addressing pain sensitization was briefly reviewed. The patient was then guided through breath- and pain-focused meditations (body scan).    Homework: The patient was asked to practice guided meditation/diaphragmatic breathing daily, watch Lorimer Moseley’s Ted Talk “Why Things Hurt,” and bring awareness to elements that related to sensitization vs desensitization over the next week    Behavioral observations: The patient arrived on time for group session today. They were appropriately dressed and appeared well groomed.  Mood was euthymic with broad affect. They appeared appropriately engaged with the facilitator, group members, and materials throughout the group session.  The patient did not express any suicidal or homicidal ideation, intent, or plans.

## 2021-05-06 NOTE — Progress Notes (Signed)
PHYSICAL THERAPY TREATMENT NOTE    Distant Site Telemedicine Encounter      I conducted this encounter via secure, live, face-to-face video conference with the patient. I reviewed the risks and benefits of telemedicine as pertinent to this visit and the patient agreed to proceed.    Provider Location: On-site location (clinic, hospital, on-site office)  Patient Location: At home  Present with patient: No one else present       GENERAL VISIT INFO    Encounter Diagnoses   Name Primary?    Psychological factors affecting medical condition     Chronic bilateral low back pain without sciatica Yes     Referring Provider: Bethann Punches, PhD  Referring Provider NPI: [id number]  Reason for referral/Medical necessity:to minimize pain and it's functional impairment; to optimize movement and sense of safety in physical activity; participate in movement class and movement exercise activity x3/week.    VISIT COUNT   2    EVALUATION       Based on screening, patient's fall risk is normal.         Past Medical History:   Diagnosis Date    Pain of right hip joint 01/23/2016    Intraparenchymal hemorrhage of brain (HCC) 2017    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 myelopathy or radiculopathy 05/28/2014    Allergic rhinitis due to allergen     Carotid Sinus Hypersensitivity     Disc disorder of lumbosacral region     Dizziness     Heart murmur     Hip injury     HYPERTENSION      HYPOTENSION      Nosebleed     Strep throat     Stroke [facility])     Syncope     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(HCC)     URI (upper respiratory infection)        Past Surgical History:   Procedure Laterality Date    L meniciscus Left 07/2017    by Dr Marcelle Overlie , re did left menisicus     PR ANES; COLONOSCOPY  2002    repeat in 3 years    PR ANES; COLONOSCOPY & POLYPECTOMY  11/18/2007    repeat in 3 years    PR HALLUX RIGIDUS W/CHEILECTOMY 1ST MP JT W/O IMPLT Right 10/21/2017    Dr. 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            [facility]K EVALUATION    SUBJECTIVE EXAM    Ronnie Mason was attentive & engaged in our movement group session this afternoon.    ASSESSMENT    [facility] is a great candidate for this group movement class - weekly for 8 weeks starting Sept 27.    TREATMENT AND PLAN           Plan of Care: attend Coping & Movement class (with Pain CLinic & PT) for 8 weeks (movement class is 60 mins x1/week)  Long-term functional goals: Patient able to : to decrease pain and it's functional impairment by at least 25%; to optimize movement and sense of safety in physical activity; participate in movement class and movement exercise activity x3/week.        Interventions/Treatment provided:  commenced yoga class session one - see group class materials in attachment below.      Interventions for next therapy session: attend group movement class 3, yoga movement session 2 next week    Rayelle Armor W

## 2021-05-13 ENCOUNTER — Ambulatory Visit: Payer: Medicare PPO | Attending: Psychologist | Admitting: Psychologist

## 2021-05-13 ENCOUNTER — Ambulatory Visit (HOSPITAL_BASED_OUTPATIENT_CLINIC_OR_DEPARTMENT_OTHER): Payer: Medicare PPO | Admitting: Rehabilitative and Restorative Service Providers"

## 2021-05-13 ENCOUNTER — Encounter (HOSPITAL_BASED_OUTPATIENT_CLINIC_OR_DEPARTMENT_OTHER): Payer: Self-pay | Admitting: Psychologist

## 2021-05-13 DIAGNOSIS — G8929 Other chronic pain: Secondary | ICD-10-CM

## 2021-05-13 DIAGNOSIS — M545 Low back pain, unspecified: Secondary | ICD-10-CM

## 2021-05-13 DIAGNOSIS — F54 Psychological and behavioral factors associated with disorders or diseases classified elsewhere: Secondary | ICD-10-CM

## 2021-05-13 NOTE — Progress Notes (Signed)
Group session 3    Date: 05/13/21     Duration: 120 minutes    Number of participants: 12    Facilitator: Zephyra Bernardi, PhD    The patient attended the 3rd 2-hour group session of an 8-week cognitive-behavioral therapy-based, self-management course on chronic pain. The patient was previously provided an overview of the confidentiality of personal information shared during the class and expressed agreement not to divulge clinical information shared by other participants during the pain management course.      SUMMARY OF SESSION CONTENT:  Today’s group session focused on introduction to psychoeducational material related to time-based activity pacing for desensitization and increasing activity. The chronic pain cycle was presented and discussed. The “protect by pain threshold” was discussed in the context of sensitization. The benefits of graded exposure to activity, pacing activity with rest/relaxation, and gradual increases over time were discussed. The role of movement in addressing pain sensitization was briefly reviewed. The patient was then guided through breath- and pain-focused meditations.    Homework: The patient was asked to practice time-based activity pacing with one activity this week and continue practicing guided meditation, in combination with pacing    Behavioral observations: The patient arrived on time for group session today. They were appropriately dressed and appeared well groomed.  Mood was euthymic with broad affect. They appeared appropriately engaged with the facilitator, group members, and materials throughout the group session.  The patient did not express any suicidal or homicidal ideation, intent, or plans.

## 2021-05-14 ENCOUNTER — Telehealth (INDEPENDENT_AMBULATORY_CARE_PROVIDER_SITE_OTHER): Payer: Medicare PPO | Admitting: Internal Medicine

## 2021-05-14 NOTE — Progress Notes (Signed)
PHYSICAL THERAPY TREATMENT NOTE    Distant Site Telemedicine Encounter      I conducted this encounter via secure, live, face-to-face video conference with the patient. I reviewed the risks and benefits of telemedicine as pertinent to this visit and the patient agreed to proceed.    Provider Location: On-site location (clinic, hospital, on-site office)  Patient Location: At home  Present with patient: No one else present       GENERAL VISIT INFO    Encounter Diagnoses   Name Primary?    Psychological factors affecting medical condition Yes    Chronic bilateral low back pain without sciatica      Referring Provider: Bethann Punches, PhD  Referring Provider NPI: [id number]  Reason for referral/Medical necessity:to minimize pain and it's functional impairment; to optimize movement and sense of safety in physical activity; participate in movement class and movement exercise activity x3/week.    VISIT COUNT   2    EVALUATION       Based on screening, patient's fall risk is normal.         Past Medical History:   Diagnosis Date    Pain of right hip joint 01/23/2016    Intraparenchymal hemorrhage of brain (HCC) 2017    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 myelopathy or radiculopathy 05/28/2014    Allergic rhinitis due to allergen     Carotid Sinus Hypersensitivity     Disc disorder of lumbosacral region     Dizziness     Heart murmur     Hip injury     HYPERTENSION      HYPOTENSION      Nosebleed     Strep throat     Stroke [facility])     Syncope     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(HCC)     URI (upper respiratory infection)        Past Surgical History:   Procedure Laterality Date    L meniciscus Left 07/2017    by Dr Marcelle Overlie , re did left menisicus     PR ANES; COLONOSCOPY  2002    repeat in 3 years    PR ANES; COLONOSCOPY & POLYPECTOMY  11/18/2007    repeat in 3 years    PR HALLUX RIGIDUS W/CHEILECTOMY 1ST MP JT W/O IMPLT Right 10/21/2017    Dr. 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            MSK EVALUATION    SUBJECTIVE EXAM    Ronnie Mason continues to be attentive & engaged in our group movement session this afternoon.       ASSESSMENT    [facility] is a great candidate for this group movement class - weekly for 8 weeks starting Sept 27.    TREATMENT AND PLAN           Plan of Care:attend Coping & Movement class (with Pain CLinic & PT) for 8 weeks (movement class is  60 mins x1/week)    Long-term functional goals: Patient able to :to decrease pain and it's functional impairment by at least 25%; to optimize movement and sense of safety in physical activity; participate in movement class and movement exercise activity x3/week.      Interventions/Treatment provided:  continued with yoga class (session two) - see group class materials in attachment below.      Interventions for next therapy session:attend group movement class 4, yoga movement session 3 next week    Ronnie Mason W

## 2021-05-19 ENCOUNTER — Encounter (HOSPITAL_BASED_OUTPATIENT_CLINIC_OR_DEPARTMENT_OTHER): Payer: Self-pay | Admitting: Psychologist

## 2021-05-20 ENCOUNTER — Ambulatory Visit (HOSPITAL_BASED_OUTPATIENT_CLINIC_OR_DEPARTMENT_OTHER): Payer: Medicare PPO | Admitting: Rehabilitative and Restorative Service Providers"

## 2021-05-20 ENCOUNTER — Encounter (HOSPITAL_BASED_OUTPATIENT_CLINIC_OR_DEPARTMENT_OTHER): Payer: Self-pay | Admitting: Psychologist

## 2021-05-20 ENCOUNTER — Ambulatory Visit: Payer: Medicare PPO | Attending: Psychologist | Admitting: Psychologist

## 2021-05-20 DIAGNOSIS — M545 Low back pain, unspecified: Secondary | ICD-10-CM

## 2021-05-20 DIAGNOSIS — F54 Psychological and behavioral factors associated with disorders or diseases classified elsewhere: Secondary | ICD-10-CM | POA: Insufficient documentation

## 2021-05-20 DIAGNOSIS — G8929 Other chronic pain: Secondary | ICD-10-CM

## 2021-05-20 NOTE — Progress Notes (Signed)
PHYSICAL THERAPY TREATMENT NOTE    Distant Site Telemedicine Encounter      I conducted this encounter via secure, live, face-to-face video conference with the patient. I reviewed the risks and benefits of telemedicine as pertinent to this visit and the patient agreed to proceed.    Provider Location: On-site location (clinic, hospital, on-site office)  Patient Location: At home  Present with patient: No one else present       GENERAL VISIT INFO    Encounter Diagnoses   Name Primary?    Psychological factors affecting medical condition     Chronic bilateral low back pain without sciatica Yes     Referring Provider: Bethann Punches, PhD  Referring Provider NPI: [id number]  Reason for referral/Medical necessity:to minimize pain and it's functional impairment; to optimize movement and sense of safety in physical activity; participate in movement class and movement exercise activity x3/week.    VISIT COUNT   2    EVALUATION       Based on screening, patient's fall risk is normal.         Past Medical History:   Diagnosis Date    Pain of right hip joint 01/23/2016    Intraparenchymal hemorrhage of brain (HCC) 2017    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 myelopathy or radiculopathy 05/28/2014    Allergic rhinitis due to allergen     Carotid Sinus Hypersensitivity     Disc disorder of lumbosacral region     Dizziness     Heart murmur     Hip injury     HYPERTENSION      HYPOTENSION      Nosebleed     Strep throat     Stroke [facility])     Syncope     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(HCC)     URI (upper respiratory infection)        Past Surgical History:   Procedure Laterality Date    L meniciscus Left 07/2017    by Dr Marcelle Overlie , re did left menisicus     PR ANES; COLONOSCOPY  2002    repeat in 3 years    PR ANES; COLONOSCOPY & POLYPECTOMY  11/18/2007    repeat in 3 years    PR HALLUX RIGIDUS Mason/CHEILECTOMY 1ST MP JT Mason/O IMPLT Right 10/21/2017    Dr. 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            MSK EVALUATION    SUBJECTIVE EXAM    Ronnie Mason continues to be attentive & engaged in our group movement session this afternoon.       ASSESSMENT    Ronnie Mason is a great candidate for this group movement class - weekly for 8 weeks starting Sept 27.    TREATMENT AND PLAN           Plan of Care:attend Coping & Movement class (with Pain CLinic & PT) for 8 weeks (movement class is  60 mins x1/week)    Long-term functional goals: Patient able to :to decrease pain and it's functional impairment by at least 25%; to optimize movement and sense of safety in physical activity; participate in movement class and movement exercise activity x3/week.      Interventions/Treatment provided:  continued with yoga class (session three) - see group class materials in attachment below.      Interventions for next therapy session:attend group movement class 5, yoga movement session 4 next week    Ronnie Mason

## 2021-05-20 NOTE — Progress Notes (Signed)
Group session 4    Date: 05/20/21     Duration: 120 minutes    Number of participants: 13    Facilitator: Dessie Tatem, PhD    The patient attended the 4th 2-hour group session of an 8-week cognitive-behavioral therapy-based, self-management course on chronic pain. The patient was previously provided an overview of the confidentiality of personal information shared during the class and expressed agreement not to divulge clinical information shared by other participants during the pain management course.      SUMMARY OF SESSION CONTENT:  Today’s group session focused on introduction to psychoeducational material related to thought challenging, thought defusion, unhelpful thinking styles, and automatic negative thoughts. The role of attention, appraisal, and behavior was discussed with regard to pain sensitization and coping. The benefit of practicing cognitive skills, practicing observing internal experiences, and developing coping statements was discussed. The patient was then guided through breath- and pain-focused meditations.    Homework: Continue meditation and pacing practice; practice thought challenging and defusion, develop cooping statements    Behavioral observations: The patient arrived on time for group session today. They were appropriately dressed and appeared well groomed.  Mood was euthymic with broad affect. They appeared appropriately engaged with the facilitator, group members, and materials throughout the group session.  The patient did not express any suicidal or homicidal ideation, intent, or plans.

## 2021-05-23 ENCOUNTER — Encounter (HOSPITAL_BASED_OUTPATIENT_CLINIC_OR_DEPARTMENT_OTHER): Payer: Self-pay | Admitting: Psychologist

## 2021-05-27 ENCOUNTER — Encounter (HOSPITAL_BASED_OUTPATIENT_CLINIC_OR_DEPARTMENT_OTHER): Payer: Self-pay | Admitting: Psychologist

## 2021-05-27 ENCOUNTER — Ambulatory Visit (HOSPITAL_BASED_OUTPATIENT_CLINIC_OR_DEPARTMENT_OTHER): Payer: Medicare PPO | Admitting: Rehabilitative and Restorative Service Providers"

## 2021-05-27 ENCOUNTER — Ambulatory Visit: Payer: Medicare PPO | Attending: Psychologist | Admitting: Psychologist

## 2021-05-27 DIAGNOSIS — G8929 Other chronic pain: Secondary | ICD-10-CM | POA: Insufficient documentation

## 2021-05-27 DIAGNOSIS — F54 Psychological and behavioral factors associated with disorders or diseases classified elsewhere: Secondary | ICD-10-CM | POA: Insufficient documentation

## 2021-05-27 DIAGNOSIS — M545 Low back pain, unspecified: Secondary | ICD-10-CM

## 2021-05-27 NOTE — Progress Notes (Signed)
Group session 5    Date: 05/27/21     Duration: 120 minutes    Number of participants: 11    Facilitator: Georga Stys, PhD    The patient attended the 5th 2-hour group session of an 8-week cognitive-behavioral therapy-based, self-management course on chronic pain. The patient was previously provided an overview of the confidentiality of personal information shared during the class and expressed agreement not to divulge clinical information shared by other participants during the pain management course.      SUMMARY OF SESSION CONTENT:  Today’s group session focused on introduction to psychoeducational material related to self-efficacy, exploring core values, and setting values-based goals. Reviewed materials from last session and discussed how awareness of internal dialogue can be applied to goal setting and self-efficacy. Presented psychoeducational material related to sleep guidelines, sleep hygiene, and the correlation between sleep and pain sensitivity. The patient was then guided through breath- and pain-focused meditations.    Homework: Continue meditation and pacing practice; complete values-based goals exercise; address sleep hygiene     Behavioral observations: The patient arrived on time for group session today. They were appropriately dressed and appeared well groomed.  Mood was euthymic with broad affect. They appeared appropriately engaged with the facilitator, group members, and materials throughout the group session.  The patient did not express any suicidal or homicidal ideation, intent, or plans.

## 2021-05-28 NOTE — Progress Notes (Signed)
PHYSICAL THERAPY TREATMENT NOTE    Distant Site Telemedicine Encounter      I conducted this encounter via secure, live, face-to-face video conference with the patient. I reviewed the risks and benefits of telemedicine as pertinent to this visit and the patient agreed to proceed.    Provider Location: On-site location (clinic, hospital, on-site office)  Patient Location: At home  Present with patient: No one else present       GENERAL VISIT INFO    Encounter Diagnoses   Name Primary?    Psychological factors affecting medical condition Yes    Chronic bilateral low back pain without sciatica      Referring Provider: Bethann Punches, PhD  Referring Provider NPI: [id number]  Reason for referral/Medical necessity:to minimize pain and it's functional impairment; to optimize movement and sense of safety in physical activity; participate in movement class and movement exercise activity x3/week.    VISIT COUNT   5    EVALUATION       Based on screening, patient's fall risk is normal.         Past Medical History:   Diagnosis Date    Pain of right hip joint 01/23/2016    Intraparenchymal hemorrhage of brain (HCC) 2017    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 myelopathy or radiculopathy 05/28/2014    Allergic rhinitis due to allergen     Carotid Sinus Hypersensitivity     Disc disorder of lumbosacral region     Dizziness     Heart murmur     Hip injury     HYPERTENSION      HYPOTENSION      Nosebleed     Strep throat     Stroke [facility])     Syncope     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(HCC)     URI (upper respiratory infection)        Past Surgical History:   Procedure Laterality Date    L meniciscus Left 07/2017    by Dr Marcelle Overlie , re did left menisicus     PR ANES; COLONOSCOPY  2002    repeat in 3 years    PR ANES; COLONOSCOPY & POLYPECTOMY  11/18/2007    repeat in 3 years    PR HALLUX RIGIDUS W/CHEILECTOMY 1ST MP JT W/O IMPLT Right 10/21/2017    Dr. 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            MSK EVALUATION    SUBJECTIVE EXAM    Dastan continues to be attentive & engaged in our group movement session this afternoon.       ASSESSMENT    [facility] is a great candidate for this group movement class - weekly for 8 weeks starting Sept 27.    TREATMENT AND PLAN           Plan of Care:attend Coping & Movement class (with Pain CLinic & PT) for 8 weeks (movement class is  60 mins x1/week)    Long-term functional goals: Patient able to :to decrease pain and it's functional impairment by at least 25%; to optimize movement and sense of safety in physical activity; participate in movement class and movement exercise activity x3/week.      Interventions/Treatment provided:  continued with yoga class (session four) - see group class materials in attachment below.      Interventions for next therapy session:attend group movement class 6, yoga movement session 5 next week    Shamarion Coots W

## 2021-05-30 ENCOUNTER — Encounter (HOSPITAL_BASED_OUTPATIENT_CLINIC_OR_DEPARTMENT_OTHER): Payer: Self-pay | Admitting: Psychologist

## 2021-06-03 ENCOUNTER — Encounter (HOSPITAL_BASED_OUTPATIENT_CLINIC_OR_DEPARTMENT_OTHER): Payer: Self-pay | Admitting: Psychologist

## 2021-06-03 ENCOUNTER — Ambulatory Visit: Payer: Medicare PPO | Attending: Psychologist | Admitting: Psychologist

## 2021-06-03 ENCOUNTER — Ambulatory Visit (HOSPITAL_BASED_OUTPATIENT_CLINIC_OR_DEPARTMENT_OTHER): Payer: Medicare PPO | Admitting: Rehabilitative and Restorative Service Providers"

## 2021-06-03 DIAGNOSIS — F54 Psychological and behavioral factors associated with disorders or diseases classified elsewhere: Secondary | ICD-10-CM | POA: Insufficient documentation

## 2021-06-03 DIAGNOSIS — G8929 Other chronic pain: Secondary | ICD-10-CM | POA: Insufficient documentation

## 2021-06-03 DIAGNOSIS — M545 Low back pain, unspecified: Secondary | ICD-10-CM | POA: Insufficient documentation

## 2021-06-03 NOTE — Progress Notes (Signed)
Group session 6    Date: 06/03/21     Duration: 51 minutes    Number of participants: 10    Facilitator: Ned Card, PhD    The patient attended the 6th 2-hour group session of an 8-week cognitive-behavioral therapy-based, self-management course on chronic pain. The patient was previously provided an overview of the confidentiality of personal information shared during the class and expressed agreement not to divulge clinical information shared by other participants during the pain management course.      SUMMARY OF SESSION CONTENT:  Todays group session focused on introduction to psychoeducational material related to acceptance vs control strategies, clean vs dirty pain, and emotion processing. Reviewed drop the rope metaphor. Reviewed materials from last session and discussed identifying values and using awareness of values to help with goal setting and goal flexibility. Reviewed desensitization and somatic tracking as a tool for mindfulness and pain management. The patient was then guided through breath- and pain-focused meditations.    Homework: complete clean vs dirty pain exercise, practice somatic tracking      Behavioral observations: The patient arrived on time for group session today. They were appropriately dressed and appeared well groomed.  Mood was euthymic with broad affect. They appeared appropriately engaged with the facilitator, group members, and materials throughout the first half group session. However, he dropped out of the group at 1:51 and did not rejoin the session. The patient did not express any suicidal or homicidal ideation, intent, or plans.

## 2021-06-04 ENCOUNTER — Other Ambulatory Visit: Payer: Self-pay

## 2021-06-04 NOTE — Progress Notes (Signed)
No show, no treatment provided - class notes sent to [name]

## 2021-06-06 ENCOUNTER — Encounter (HOSPITAL_BASED_OUTPATIENT_CLINIC_OR_DEPARTMENT_OTHER): Payer: Self-pay | Admitting: Psychologist

## 2021-06-10 ENCOUNTER — Ambulatory Visit: Payer: Medicare PPO | Attending: Psychologist | Admitting: Psychologist

## 2021-06-10 ENCOUNTER — Encounter (HOSPITAL_BASED_OUTPATIENT_CLINIC_OR_DEPARTMENT_OTHER): Payer: Self-pay | Admitting: Psychologist

## 2021-06-10 DIAGNOSIS — F54 Psychological and behavioral factors associated with disorders or diseases classified elsewhere: Secondary | ICD-10-CM | POA: Insufficient documentation

## 2021-06-10 DIAGNOSIS — G8929 Other chronic pain: Secondary | ICD-10-CM | POA: Insufficient documentation

## 2021-06-10 DIAGNOSIS — M545 Low back pain, unspecified: Secondary | ICD-10-CM | POA: Insufficient documentation

## 2021-06-10 NOTE — Progress Notes (Signed)
Group session 7    Date: 06/10/21     Duration: 120 minutes    Number of participants: 11    Facilitator: Mariell Nester, PhD    The patient attended the 7th 2-hour group session of an 8-week cognitive-behavioral therapy-based, self-management course on chronic pain. The patient was previously provided an overview of the confidentiality of personal information shared during the class and expressed agreement not to divulge clinical information shared by other participants during the pain management course.      SUMMARY OF SESSION CONTENT:  Today’s group session focused on introduction to psychoeducational material related to cost/benefit analysis of control strategies, mindfulness practice, and bringing concepts together in ACT. Reviewed materials from last session and discussed how awareness of clean vs dirty pain and dropping the rope. Presented psychoeducational material related to mindfulness practice, ACT, and creative hopelessness. Reviewed assertive communication skills. The patient was then guided through breath- and pain-focused meditations.    Homework: complete cost/benefit analysis of control strategies, practice mindfulness with daily tasks.     Behavioral observations: The patient arrived on time for group session today. They were appropriately dressed and appeared well groomed.  Mood was euthymic with broad affect. They appeared appropriately engaged with the facilitator, group members, and materials throughout the group session.  The patient did not express any suicidal or homicidal ideation, intent, or plans.

## 2021-06-12 ENCOUNTER — Telehealth (INDEPENDENT_AMBULATORY_CARE_PROVIDER_SITE_OTHER): Payer: Self-pay | Admitting: Internal Medicine

## 2021-06-12 ENCOUNTER — Encounter (HOSPITAL_BASED_OUTPATIENT_CLINIC_OR_DEPARTMENT_OTHER): Payer: Self-pay | Admitting: Psychologist

## 2021-06-12 NOTE — Telephone Encounter (Signed)
Patient walked-in to the clinic, would like to change pharmacy.  Please send medication to     [facility]   [address] [place], [address]  [phone]

## 2021-06-14 NOTE — Telephone Encounter (Signed)
Pharmacy updated. Nothing further needed. Closing encounter. -DI

## 2021-06-17 ENCOUNTER — Encounter (HOSPITAL_BASED_OUTPATIENT_CLINIC_OR_DEPARTMENT_OTHER): Payer: Self-pay | Admitting: Psychologist

## 2021-06-17 ENCOUNTER — Ambulatory Visit (HOSPITAL_BASED_OUTPATIENT_CLINIC_OR_DEPARTMENT_OTHER): Payer: Medicare PPO | Admitting: Rehabilitative and Restorative Service Providers"

## 2021-06-17 ENCOUNTER — Ambulatory Visit: Payer: Medicare PPO | Attending: Psychologist | Admitting: Psychologist

## 2021-06-17 DIAGNOSIS — G8929 Other chronic pain: Secondary | ICD-10-CM | POA: Insufficient documentation

## 2021-06-17 DIAGNOSIS — M545 Low back pain, unspecified: Secondary | ICD-10-CM | POA: Insufficient documentation

## 2021-06-17 DIAGNOSIS — F54 Psychological and behavioral factors associated with disorders or diseases classified elsewhere: Secondary | ICD-10-CM | POA: Insufficient documentation

## 2021-06-17 NOTE — Progress Notes (Signed)
Group session 8    Date: 06/17/21     Duration: 120 minutes    Number of participants: 12    Facilitator: Ashlei Chinchilla, PhD    The patient attended the 8th 2-hour group session of an 8-week cognitive-behavioral therapy-based, self-management course on chronic pain. The patient was previously provided an overview of the confidentiality of personal information shared during the class and expressed agreement not to divulge clinical information shared by other participants during the pain management course.      SUMMARY OF SESSION CONTENT:  Today’s group session focused on review of material related to cost/benefit analysis of control strategies, mindfulness practice, and bringing concepts together in ACT. Reviewed materials from last session and discussed increasing insight into behaviors. Reviewed skills learned so far in group and planning for future goals and maintenance of health behaviors. The patient was then guided through breath- and pain-focused meditations.    Homework: n/a    Behavioral observations: The patient arrived on time for group session today. They were appropriately dressed and appeared well groomed.  Mood was euthymic with broad affect. They appeared appropriately engaged with the facilitator, group members, and materials throughout the group session.  The patient did not express any suicidal or homicidal ideation, intent, or plans.

## 2021-07-03 ENCOUNTER — Other Ambulatory Visit (HOSPITAL_BASED_OUTPATIENT_CLINIC_OR_DEPARTMENT_OTHER): Payer: Self-pay | Admitting: Psychiatry

## 2021-07-03 DIAGNOSIS — F1020 Alcohol dependence, uncomplicated: Secondary | ICD-10-CM

## 2021-07-04 MED ORDER — NALTREXONE HCL 50 MG OR TABS
ORAL_TABLET | ORAL | 1 refills | Status: DC
Start: 2021-07-04 — End: 2021-09-04

## 2021-07-17 ENCOUNTER — Ambulatory Visit (INDEPENDENT_AMBULATORY_CARE_PROVIDER_SITE_OTHER): Payer: Medicare PPO

## 2021-07-17 DIAGNOSIS — M722 Plantar fascial fibromatosis: Secondary | ICD-10-CM

## 2021-07-17 DIAGNOSIS — M205X1 Other deformities of toe(s) (acquired), right foot: Secondary | ICD-10-CM

## 2021-07-17 NOTE — Progress Notes (Signed)
Dimitris present today to get 2 more pairs of Quikstrides. Please see Dr. Julianne Rice last visit note.  I modified the two pairs as before.  Removed the thin top cover of the Quikstrides made a fibroid modification on his right devices .  1/8" Spenco top cover bilateral  1/8"Eva right to toes with a 1st met distal tip cutout and back filled with 1/16" PPT on the backside forefoot.  1/8" EVA left side cut just past sulcus and tapered on the backside forefoot.

## 2021-07-23 ENCOUNTER — Ambulatory Visit (INDEPENDENT_AMBULATORY_CARE_PROVIDER_SITE_OTHER): Payer: Medicare PPO | Admitting: Internal Medicine

## 2021-07-23 ENCOUNTER — Encounter (INDEPENDENT_AMBULATORY_CARE_PROVIDER_SITE_OTHER): Payer: Self-pay | Admitting: Internal Medicine

## 2021-07-23 ENCOUNTER — Ambulatory Visit (INDEPENDENT_AMBULATORY_CARE_PROVIDER_SITE_OTHER): Admit: 2021-07-23 | Discharge: 2021-07-23 | Disposition: A | Discharge status: Home / Self Care | Payer: Medicare PPO

## 2021-07-23 VITALS — BP 130/80 | HR 88 | Temp 98.0°F | Resp 16 | Wt 235.0 lb

## 2021-07-23 DIAGNOSIS — M542 Cervicalgia: Secondary | ICD-10-CM

## 2021-07-23 DIAGNOSIS — Z91030 Bee allergy status: Secondary | ICD-10-CM

## 2021-07-23 DIAGNOSIS — L309 Dermatitis, unspecified: Secondary | ICD-10-CM

## 2021-07-23 DIAGNOSIS — E785 Hyperlipidemia, unspecified: Secondary | ICD-10-CM

## 2021-07-23 DIAGNOSIS — G250 Essential tremor: Secondary | ICD-10-CM

## 2021-07-23 DIAGNOSIS — M4722 Other spondylosis with radiculopathy, cervical region: Secondary | ICD-10-CM

## 2021-07-23 DIAGNOSIS — M792 Neuralgia and neuritis, unspecified: Secondary | ICD-10-CM

## 2021-07-23 DIAGNOSIS — I10 Essential (primary) hypertension: Secondary | ICD-10-CM

## 2021-07-23 DIAGNOSIS — Z8679 Personal history of other diseases of the circulatory system: Secondary | ICD-10-CM

## 2021-07-23 DIAGNOSIS — G894 Chronic pain syndrome: Secondary | ICD-10-CM

## 2021-07-23 MED ORDER — ATORVASTATIN CALCIUM 10 MG OR TABS
10.0000 mg | ORAL_TABLET | Freq: Every day | ORAL | 3 refills | Status: DC
Start: 2021-07-23 — End: 2022-01-07

## 2021-07-23 MED ORDER — PREGABALIN 300 MG OR CAPS
300.0000 mg | ORAL_CAPSULE | Freq: Two times a day (BID) | ORAL | 1 refills | Status: DC
Start: 2021-07-23 — End: 2022-01-07

## 2021-07-23 MED ORDER — PRIMIDONE 50 MG OR TABS
50.0000 mg | ORAL_TABLET | Freq: Two times a day (BID) | ORAL | 4 refills | Status: DC
Start: 2021-07-23 — End: 2022-01-07

## 2021-07-23 MED ORDER — EPINEPHRINE 0.3 MG/0.3ML IJ SOAJ
INTRAMUSCULAR | 3 refills | Status: DC
Start: 2021-07-23 — End: 2022-10-19

## 2021-07-23 MED ORDER — LISINOPRIL 20 MG OR TABS
20.0000 mg | ORAL_TABLET | Freq: Two times a day (BID) | ORAL | 1 refills | Status: DC
Start: 2021-07-23 — End: 2022-01-23

## 2021-07-23 MED ORDER — BETAMETHASONE DIPROPIONATE 0.05 % EX LOTN
TOPICAL_LOTION | Freq: Two times a day (BID) | CUTANEOUS | 11 refills | Status: DC
Start: 2021-07-23 — End: 2022-08-14

## 2021-07-23 MED ORDER — AMLODIPINE BESYLATE 5 MG OR TABS
5.0000 mg | ORAL_TABLET | Freq: Two times a day (BID) | ORAL | 3 refills | Status: DC
Start: 2021-07-23 — End: 2022-01-07

## 2021-07-23 MED ORDER — DULOXETINE HCL 60 MG OR CPEP
60.0000 mg | DELAYED_RELEASE_CAPSULE | Freq: Two times a day (BID) | ORAL | 2 refills | Status: DC
Start: 2021-07-23 — End: 2022-01-07

## 2021-07-23 NOTE — Progress Notes (Signed)
Chronic pain temperature 94 degrees

## 2021-07-23 NOTE — Progress Notes (Signed)
Ronnie Mason is a 70 year old male       BP 130/80    Pulse 88    Temp 36.7 C (Temporal)    Resp 16    Wt (!) 106.6 kg (235 lb)    SpO2 96%    BMI 28.61 kg/m   Chief Complaint   Patient presents with    Follow-Up      Follow up on neuropathic pain     Past alcohol abuse maintained on naltrexone chronic Lyrica for chronic pain past history of toe amputation  Chronic low back pain    Wants all of his medication refilled at [facility] switching pharmacy      HPI  History of:    pcp [name], MD         Patient Care Team:  [name], MD as PCP - General (Internal Medicine)  Ward Chatters, MD as Cardiologist  (Cardiology)  Geri Seminole, MD (Sports Medicine)  Clydene Pugh, MD (Gastroenterology)  Casandra Doffing, MD (General Surgery)  Reggie Pile, ARNP (Urology)  Hilarie Fredrickson Dong-Hyun, DPM (Podiatry)  Renea Ee, MD as Primary Contact (Pain Management)  Doreene Nest, MD as Surgeon (Otolaryngology)       Today follow-up above issues:     Finished the class w Dr Karn Cassis    Has not seen Dr Karn Cassis need to see her for the neck , back is good    Mind body connection is helping    Feels neck so sore can not turn head , doing pt and helps , [place] massage, stinger on left side    In past he report had injection 20 yrs ago , goes down to both elbows, has K tapes    Main issue is pain not weakness   strength th in legs better, pool exercises, hot tub nelps     Most of time pain worst day time so bad than hands shake- elbow, 8-9 /10    Not weak    If lay down in certain position , fetal position , ok     Last xray 2015 djd cervical spine       He said exercises makes It worst     Has been doing [place] to neck and given exercises , very light resistance only started one week ago       Alcohol is only one glass per day  Taking the naltrexone   No longer w addiction class w [facility]       Monitors bp at home but not recently       Orders Only on 01/15/21   1. LIPID PANEL    Result Value Ref Range    Total Cholesterol 202 (H) <200 mg/dL    Triglyceride 458 (H) <150 mg/dL    HDL Cholesterol 099 >39 mg/dL    Cholesterol (LDL) 68 <130 mg/dL    Non-HDL Cholesterol 99 0 - 159 mg/dL    Cholesterol/HDL Ratio 2.0     Lipid Panel, Additional Info. (NOTE)      *Note: Due to a large number of results and/or encounters for the requested time period, some results have not been displayed. A complete set of results can be found in Results Review.       No results found.    MEDICATION PRIOR TO VISIT  Reports   Outpatient Medications Prior to Visit   Medication Sig Dispense Refill    Acetaminophen 500 MG Oral Tab  Take 1,000 mg by mouth 2 times a day.      Alpha-Lipoic Acid 300 MG tablet Take 1 tablet by mouth daily.      amLODIPine 5 MG tablet Take 1 tablet (5 mg) by mouth 2 times a day. 180 tablet 3    amoxicillin-clavulanate 875-125 MG tablet Take 1 tablet (875 mg) by mouth 2 times a day. Take 1 tablet (875 mg) by mouth 2 times a day for 10 days. 20 tablet 0    atorvastatin 10 MG tablet take 1 tablet by mouth once daily 90 tablet 3    BENFOTIAMINE OR Take 250 mg by mouth daily.      betamethasone dipropionate 0.05 % lotion Apply topically 2 times a day. Apply to scalp and ears as needed for itching 60 mL 11    Cholecalciferol (VITAMIN D3) 2000 units Oral Cap Take 1 capsule (2,000 Units) by mouth daily. For low level 1 capsule 1    Cyanocobalamin 1000 MCG Oral Tab one per day over-the-counter started 5/21 /2012 this level borderline low 1 Tab 1    diclofenac 1 % gel Apply 4 g topically 4 times a day. Apply to 2 gram four times daily to neck, trapezius muscle, and low back. Use a max of 32 grams a day. 300 g 4    DULoxetine 60 MG DR capsule Take 1 capsule (60 mg) by mouth 2 times a day. 180 capsule 2    EPINEPHrine 0.3 MG/0.3ML auto-injector INJECT INTRAMUSCULARLY ONCE AS NEEDED FOR SEVERE ALLERGIC REACTION 4 each 3    ibuprofen 200 MG tablet Take 200 mg by mouth daily as needed (pain).       lidocaine 5 % patch Apply 1 patch onto the skin daily. Apply to painful area for up to 12 hours in a 24 hour period. (Patient taking differently: Apply 1 patch onto the skin daily as needed. Apply to painful area for up to 12 hours in a 24 hour period.) 30 patch 5    lisinopril 20 MG tablet Take 1 tablet (20 mg) by mouth every 12 hours. 180 tablet 1    meloxicam 15 MG tablet take 1 tablet by mouth once daily 20 tablet 1    naltrexone 50 MG tablet take 1 tablet by mouth once daily 30 tablet 1    Omega-3 Fatty Acids (OMEGA 3 OR) Take 1 tablet by mouth daily.      pregabalin 300 MG capsule Take 1 capsule (300 mg) by mouth 2 times a day. 180 capsule 1    primidone 50 MG tablet Take 1 tablet (50 mg) by mouth 2 times a day. 180 tablet 4     Facility-Administered Medications Prior to Visit   Medication Dose Route Frequency Provider Last Rate Last Admin    iohexol (Omnipaque) 240 MG/ML contrast injection 10 mL  10 mL See Admin Instructions Once Notch, Derek, MD           REVIEW OF SYSTEMS -   Not reviewed if not marked   P=Positive  N=Negative      P   N    []  []  Constitutional:   []  []  Eyes:    []  []  Head ears, nose mouth throat:  []  [x]  Cardiovascular:    []  [x]  Respiratory:     []  []  Gastrointestinal:     []  []  Genitourinary:      []  []  Musculoskeletal:      []  []  Integumentary:     []  []  Neurological:     []  []   Psychiatric/Behavioral:        Endocrine:       Hematological:       Allergic/Immunologic:        Breast:     Male: GU /testicular:         PHYSICAL Exam    BP 130/80    Pulse 88    Temp 36.7 C (Temporal)    Resp 16    Wt (!) 106.6 kg (235 lb)    SpO2 96%    BMI 28.61 kg/m     Physical Exam    He is pleasant in no acute distress  No palpable tenderness to cervical spine declines to rotate his neck  He has no weakness in his hands or  Arms    Heart RRR no m  Lungs cta   No leg edema     IMPRESSION / PLAN / DISCUSSION   (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    Diagnoses and all orders for this visit:    Osteoarthritis of spine with radiculopathy, cervical region  -     XR C Spine 4-5 Vw; Future    Neuropathic pain  -     pregabalin 300 MG capsule; Take 1 capsule (300 mg) by mouth 2 times a day.    Essential tremor  -     primidone 50 MG tablet; Take 1 tablet (50 mg) by mouth 2 times a day.    Dermatitis  -     betamethasone dipropionate 0.05 % lotion; Apply topically 2 times a day. Apply to scalp and ears as needed for itching    H/O cerebral parenchymal hemorrhage  -     amLODIPine 5 MG tablet; Take 1 tablet (5 mg) by mouth 2 times a day.    Essential hypertension  -     amLODIPine 5 MG tablet; Take 1 tablet (5 mg) by mouth 2 times a day.  -     lisinopril 20 MG tablet; Take 1 tablet (20 mg) by mouth every 12 hours.    Hyperlipidemia, unspecified hyperlipidemia type  -     atorvastatin 10 MG tablet; Take 1 tablet (10 mg) by mouth daily.    Chronic pain syndrome  -     DULoxetine 60 MG DR capsule; Take 1 capsule (60 mg) by mouth 2 times a day.  -     Chronic Pain Drug Risk 1 w/Cnslt, Urine; Future    Cervicalgia  -     DULoxetine 60 MG DR capsule; Take 1 capsule (60 mg) by mouth 2 times a day.    Bee sting allergy  -     EPINEPHrine 0.3 MG/0.3ML auto-injector; INJECT INTRAMUSCULARLY ONCE AS NEEDED FOR SEVERE ALLERGIC REACTION        H/o  djd cervical spine  And past x ray 2015  Re x ray to look for major lesions or listhesis that would warrant further mri  For now cointue w pt  going/electrostim and new rx plan w pt that was just started last wek    Consider mri if weak     To see rehab med already scheduled  The classes have helped w understanding the back pain and making it more bearable  Hope will help w neck also    continue lyrica    bp controlled and continue med       Controlling the alcohol use still w naltrexone   Agrees to drug testing on lyrical     Tremor controlled on med w/o side effect , continue    bp controlled on med and no changes needed     Discussed hm due along with other issues above   Health Maintenance Topics with due status: Overdue       Topic Date Due    Zoster Vaccine 07/22/2020     Health Maintenance Topics with due status: Due Soon       Topic Date Due    Lipid Disorders Screening 01/15/2022         No follow-ups on file.    Patient   express understanding of care plan/medications  Risk of taking medication properly and follow up discussed especially to call if problem    Created with the assistance of voice recognition software dictation

## 2021-07-23 NOTE — Patient Instructions (Signed)
Complete the shingrix at pharmacy

## 2021-07-24 LAB — CHR PAIN DRUG RSK1 W/CNSLT, URN
Amphet/Methamphetamine Qual,URN: NEGATIVE
Barbiturate (Qual), URN: POSITIVE — AB
Benzodiazepines (Qual), URN: NEGATIVE
Buprenorphine Qualitatiave, URN: NEGATIVE
Cannabinoids (Qual), URN: NEGATIVE
Cocaine (Qual), URN: NEGATIVE
Fentanyl Qual, URN: NEGATIVE
Methadone (Qual), URN: NEGATIVE
Opiates (Qual), URN: NEGATIVE
Oxycodone (Qual), URN: NEGATIVE
Tramadol Qual, URN: NEGATIVE

## 2021-07-29 NOTE — Result Encounter Note (Signed)
[facility] physical therapy is working , see rehab med on this please

## 2021-08-06 NOTE — Result Encounter Note (Signed)
As expected

## 2021-08-07 ENCOUNTER — Encounter (HOSPITAL_BASED_OUTPATIENT_CLINIC_OR_DEPARTMENT_OTHER): Payer: Self-pay | Admitting: Anesthesiology

## 2021-08-08 ENCOUNTER — Other Ambulatory Visit (HOSPITAL_BASED_OUTPATIENT_CLINIC_OR_DEPARTMENT_OTHER): Payer: Self-pay | Admitting: Anesthesiology

## 2021-08-08 DIAGNOSIS — M5412 Radiculopathy, cervical region: Secondary | ICD-10-CM

## 2021-08-29 ENCOUNTER — Ambulatory Visit: Payer: Medicare PPO | Attending: Anesthesiology | Admitting: Anesthesiology

## 2021-08-29 ENCOUNTER — Encounter (HOSPITAL_BASED_OUTPATIENT_CLINIC_OR_DEPARTMENT_OTHER): Payer: Self-pay | Admitting: Anesthesiology

## 2021-08-29 VITALS — BP 158/98 | HR 79 | Temp 97.5°F | Ht 76.0 in | Wt 225.0 lb

## 2021-08-29 DIAGNOSIS — G8929 Other chronic pain: Secondary | ICD-10-CM | POA: Insufficient documentation

## 2021-08-29 DIAGNOSIS — M792 Neuralgia and neuritis, unspecified: Secondary | ICD-10-CM | POA: Insufficient documentation

## 2021-08-29 DIAGNOSIS — M542 Cervicalgia: Secondary | ICD-10-CM | POA: Insufficient documentation

## 2021-08-29 DIAGNOSIS — M47816 Spondylosis without myelopathy or radiculopathy, lumbar region: Secondary | ICD-10-CM | POA: Insufficient documentation

## 2021-08-29 DIAGNOSIS — M7918 Myalgia, other site: Secondary | ICD-10-CM | POA: Insufficient documentation

## 2021-08-29 DIAGNOSIS — M545 Low back pain, unspecified: Secondary | ICD-10-CM | POA: Insufficient documentation

## 2021-08-29 DIAGNOSIS — M48061 Spinal stenosis, lumbar region without neurogenic claudication: Secondary | ICD-10-CM | POA: Insufficient documentation

## 2021-08-29 DIAGNOSIS — M5412 Radiculopathy, cervical region: Secondary | ICD-10-CM | POA: Insufficient documentation

## 2021-08-29 MED ORDER — METHYLPREDNISOLONE 4 MG OR TBPK
ORAL_TABLET | ORAL | 0 refills | Status: AC
Start: 2021-08-29 — End: 2021-09-04

## 2021-08-29 NOTE — Progress Notes (Signed)
Review of Systems   Constitutional: Negative.    HENT: Negative.    Eyes: Negative.    Respiratory: Negative.    Cardiovascular: Negative.    Gastrointestinal: Negative.    Endocrine: Negative.    Genitourinary: Negative.    Musculoskeletal: Positive for neck pain and neck stiffness.   Skin: Negative.    Allergic/Immunologic: Negative.    Neurological: Positive for tremors, weakness and numbness.   Hematological: Negative.    Psychiatric/Behavioral: Negative.

## 2021-08-29 NOTE — Patient Instructions (Addendum)
Cervical spine MRI as scheduled on 09/04/21  Prescribed Medrol dose pack today  After MRI results, we will consider next steps which may include cervical epidural vs medial branch block, medication management, or other conservative management

## 2021-08-29 NOTE — Progress Notes (Signed)
CHIEF COMPLAINT:   Chief Complaint   Patient presents with    Follow-Up     Neck Pain     Pinch nerve on Left side. Been bothering him for about 6 months.        HISTORY OF PRESENT ILLNESS:  The purpose of today's visit is interval reevaluation of neck and back pain.  The patient has a pain history significant for right Hip OA, R greater trochanter bursitis, L4-5 L5-S1 Facet arthropathy,radiculopathy, andmyofascial pain. . The patient was last seen by Ronnie Mason/Ronnie Mason on 03/21/21 at which time we recommended continuing PT, referral to pain psych classes, use of myofascial release balls..     Interval History:  - History of neck pain 20 years ago which resolved with RFA at the time  - Neck pain returned 6 months ago and worsened in the last several weeks.  Neck pain located left side with radiation into left shoulder and left lateral elbow. Has associated paresthesia in similar distribution.  - Noticing bilateral shoulder tightness and soreness as well.  No history of shoulder issues.  - Had a fall in July after slipping in the shower   - Denies bowel/bladder incontinence    Current pain regimen:  Tylenol 1000mg  BID  Aleve 2 tabs BID  Duloxetine 60mg  daily  Pregabalin 300mg  BID  Lidocaine patches 5% daily to low back, neck, shoulders  Ktape shoulder and neck  Heat and ice packs  TENS unit  Pain psychology    Prior injections:  Multiple prior injections by outside providers( trigger point injections):  ? 03/23/2018 -LEFT L5 TFESI with 80-85%relief for about 3-4 weeksof just axial back pain.  ? 09/15/2017 -Bilateral L5TFESI with 50-75% reliefof mixture of low back and leg pain.  ? 05/19/2017 -RIGHT L5 TFESIwith 100% relief ofRadicularsymptoms for about 3 months  ? 08/12/2016 - L5-S1 ILESI  ? 07/01/2016 - Ultrasound guided right trochanteric bursa injection   ? 05/20/2016 - Bilateral shoulder injections  ? 05/13/2016 - Right L3 L4 L5/S1 RFA   ? 02/19/2016 - Left L3, L4, L5/S1 RFA   ? 12/27/2012 -  L5-S1 IL ESI  ? 08/31/2012 - Left L5 TFESI  ? 03/30/2012 - Left L4/5, L5/S1 facet inj  ? 02/10/2012 - Left C6 C7 MB RFA  ? 07/03/2011 - Left L3, L4, L5/S1 RFA  ? 05/12/2011 - Left C4, C5, C6 RFA    Labs/Imaging Reviewed:  MRI C-spine scheduled for 09/04/21    XR C-spine 07/23/21  Mild anterolisthesis of C4 on C5. There is 2 mm of increased anterolisthesis with flexion.  No acute fracture or traumatic subluxation. Mild disc height loss at C5-C6 with a posterior disc osteophyte complex.  No prevertebral soft tissue swelling.    REVIEW OF SYSTEMS:  The review of systems documented by our clinic staff was provided by the patient. I did review the ROS and have no further detail to add.    PHYSICAL EXAMINATION:  BP (!) 158/98    Pulse 79    Temp 36.4 C (Tympanic)    Ht 6\' 4"  (1.93 m)    Wt (!) 102.1 kg (225 lb) Comment: Pt reported   SpO2 96%    BMI 27.39 kg/m    Constitutional:Constitutional: well-appearing, well-groomed, in no distress and cooperative   Skin, exposed parts: color normal, vascularity normal, no evidence of bleeding or bruising  Eyes: Negative for miosis bilaterally. Pupils appropriate for room lighting. Extraocular movements are intact bilaterally  Cardiovascular: No cyanosis, no dilation of jugular  veins, no dyspnea.   Respiratory: Respirations are unlabored. No accessory muscle use noted  Neurologic: Gait intact.4+ left shoulder abduction. Otherwise upper extremities 5/5 in proximal and distal muscle groups. Reduced sensation to light touch left C6 throughout forearm. Lower extremities 5/5 in proximal distal muscles groups bilaterally. Reflexes 1+ in bilateral biceps, triceps, brachioradialis.Patellar reflex 2+ bilateral, achilles 1+ bilaterally. Reduced sensation to light touch through stocking distribution in lower extremities (right worse than left)                                         Musculoskeletal: Focal pain to palpation in left neck, diffuse pain in trapezius, left AC joint with hypertonicity.  Cervical ROM WNL though leftward gaze causes pain.Neck extension produces dizziness. Spurling's negative. Cervical facet loading positive on left.     IMPRESSION:   Ronnie Mason is a 71 year old male with   1. Progressive left neck pain with radiation to left lateral upper arm.  Exam suggestive of possible C5 or C6 radiculopathy with slight weakness on shoulder abduction and decreased sensation in left C6 dermatome.  Additional component of left facet arthropathy as indicated on examination as well. Hoffman's negative today with no other presenting red flag symptoms.  XR remarkable for C4-5 mild anterolisthesis and osteophyte complex along C5-6. He has a C-spine MRI w/o contrast scheduled for 09/04/21 which will provide [place] with further information regarding possible nerve root impingement vs facet arthropathy as the primary driving etiology of his pain.   Depending on MRI finding will proceed with next steps which may include cervical epidural vs medial branch block, medication management, or other conservative management.  2. Axial low back pain 2/2 facet arthropathy with improvement after L4-5, L5-S1 RFA. Additional components of myofascial pain syndrome and central sensitization, stable  3. 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  2. Prior treatment failures including Short duration of benefit forinterlaminar lumbarepidural steroids and TFESI, MBB, memantine, Naltrexone.  3. 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  Ronnie Mason had no further questions.    Diagnostic evaluation: proceed to C-spine MRI as scheduled on 09/04/21    Rehabilitation: continue PT/HEP as directed, remain active    Medication suggestions: continue current medication except stop NSAIDs for now (Voltaren gel okay).    Start Medrol dose pack today with taper.    Procedure suggestions: none today.  Depending on MRI results, consider cervical epidural vs medial branch block with progression to RFA depending on findings.    Other: Discussed acupuncture which may be helpful for myofascial pain.     Continue follow up with pain psychology.    Follow-up:     The patient was seen and evaluated with the attending physician who assisted in the formulation of the above assessment and plan.    Rafael Bihari, DO  Pain Medicine Fellow

## 2021-08-29 NOTE — Progress Notes (Signed)
I saw and evaluated the patient with Dr. Richardo Priest, who conducted the initial history.  I reviewed and confirmed the history in detail with Mr. Schleifer in person.   I was present for the examination and formulation portions of the encounter. I agree with the findings and the plan of care as documented in our notes. I did edit the trainee's note.    I spent a total of 32 minutes for the patient's care on the date of the service.        Marion Downer, MD  Assistant Professor  [facility]

## 2021-09-03 ENCOUNTER — Ambulatory Visit
Admission: RE | Admit: 2021-09-03 | Discharge: 2021-09-03 | Disposition: A | Discharge status: Home / Self Care | Payer: Medicare PPO | Attending: Diagnostic Radiology | Admitting: Diagnostic Radiology

## 2021-09-03 DIAGNOSIS — M5412 Radiculopathy, cervical region: Secondary | ICD-10-CM | POA: Insufficient documentation

## 2021-09-04 ENCOUNTER — Telehealth (HOSPITAL_BASED_OUTPATIENT_CLINIC_OR_DEPARTMENT_OTHER): Payer: Self-pay

## 2021-09-04 ENCOUNTER — Other Ambulatory Visit (HOSPITAL_BASED_OUTPATIENT_CLINIC_OR_DEPARTMENT_OTHER): Payer: Self-pay | Admitting: Psychiatry

## 2021-09-04 ENCOUNTER — Other Ambulatory Visit (HOSPITAL_BASED_OUTPATIENT_CLINIC_OR_DEPARTMENT_OTHER): Payer: Self-pay | Admitting: Anesthesiology

## 2021-09-04 ENCOUNTER — Other Ambulatory Visit (HOSPITAL_COMMUNITY): Payer: Medicare PPO

## 2021-09-04 DIAGNOSIS — F1020 Alcohol dependence, uncomplicated: Secondary | ICD-10-CM

## 2021-09-04 DIAGNOSIS — M5412 Radiculopathy, cervical region: Secondary | ICD-10-CM

## 2021-09-04 DIAGNOSIS — M4802 Spinal stenosis, cervical region: Secondary | ICD-10-CM

## 2021-09-04 MED ORDER — NALTREXONE HCL 50 MG OR TABS
ORAL_TABLET | ORAL | 1 refills | Status: DC
Start: 2021-09-04 — End: 2021-11-05

## 2021-09-04 NOTE — Telephone Encounter (Signed)
I called Ronnie Mason per Dr. Nathaniel Man message:     "Please let patient know I reviewed his MRI. Based on the results I do think a cervical epidural makes sense. I have ordered it and we will call him to schedule."    He states understanding and has no questions or concerns at this time.

## 2021-09-16 ENCOUNTER — Encounter (HOSPITAL_BASED_OUTPATIENT_CLINIC_OR_DEPARTMENT_OTHER): Payer: Self-pay

## 2021-09-18 ENCOUNTER — Encounter (HOSPITAL_BASED_OUTPATIENT_CLINIC_OR_DEPARTMENT_OTHER): Payer: Medicare PPO | Admitting: Anesthesiology

## 2021-09-30 ENCOUNTER — Encounter (INDEPENDENT_AMBULATORY_CARE_PROVIDER_SITE_OTHER): Payer: Self-pay

## 2021-10-21 NOTE — Progress Notes (Unsigned)
PROCEDURE NOTE CERVICOTHORACIC INTERLAMINAR EPIDURAL INJECTION  10/24/2021    PROCEDURE  Interlaminar cervical epidural steroid injection, needle insertion T1-T2, catheter advanced to {Spine Segments:500251829}    PRE-PROCEDURE DIAGNOSIS  No diagnosis found.[name]    POST-PROCEDURE DIAGNOSIS  No diagnosis found..    INDICATION  Mr. Falkowitz has clinical and imaging findings consistent with possible cervical radiculopathy and has failed efforts at conservative care.  ***.    ATTENDING PHYSICIAN:  Roger Kill. Erline Levine, MD    FELLOW / RESIDENT ***    SEDATION  {Yes No (Default) :109033::"No"}    NPO STATUS  {Yes No (Default) :109033::"No"}    INTRAVENOUS LINE  {Yes No (Default) :109033::"No"}    OPERATIVE PROCEDURE  Final verification was performed prior to beginning the procedure.  Mr. Foringer was brought to the procedure room and placed on the exam table in a prone position, with the head flexed. The sterile field was prepped in a sterile fashion with ChloraPrep.  Sterile drapes applied.   Fluoroscopic guidance was used for this procedure.  Appropriate images were reviewed and saved.  T1-T2 interspace was visualized by anteroposterior fluoroscopic image.   Local anesthesia was provided by infiltration of 1% lidocaine {BICARB:100029}. An 18-gauge 3.5 inch epidural  needle was placed below the superior laminar edge for a {RIGHT/LEFTLW:166} paramedian approach. Using fluoroscopic guidance, the needle was advanced into the intralaminar window. Repeated multiplanar images were done to prevent excessively deep insertion of the needle. The loss of resistance to saline technique was used as the initial method of proper needle placement. Attempted aspiration yielded {NO/YES ONSET:102948} blood or cerebrospinal fluid.  A flexible styleted catheter was advanced to the area of interest, C***.  Once in position  Omnipaque-240 (total volume <1 ml) was injected under live fluoroscopy demonstrating epidural spread and no vascular uptake. ***  Contralateral oblique view confirmed proper epidural spread.  Injectate: BETAMETHASONE SOD PHOS & ACET (CELESTONE SOLUSPAN)  {NUMBERS 3:100061::"0"} mg and  Bupivacaine {NUMBERS QUART:100538}% {NUMBERS 3:100061::"0"} ml were injected into the epidural space. The catheter was withdrawn uneventfully.  The needle was withdrawn, additional local anesthetic was injected along the track as it was removed.       POST-PROCEDURE  Mr. Grubert  was transported to the recovery room for observation where he {DID:100046} make an uneventful recovery.     COMPLICATIONS  [id number]    PLAN  Pain diary.  Next steps in his care depend on the response to today's procedure.  Follow up with ***.    Tommie Raymond, DO

## 2021-10-22 MED ORDER — BETAMETHASONE SOD PHOS & ACET 6 (3-3) MG/ML IJ SUSP
12.0000 mg | Freq: Once | INTRAMUSCULAR | Status: AC
Start: 2021-10-23 — End: 2021-10-24
  Administered 2021-10-24: 12 mg

## 2021-10-22 MED ORDER — IOHEXOL 240 MG/ML IJ SOLN
1.0000 mL | Freq: Once | INTRAMUSCULAR | Status: AC
Start: 2021-10-22 — End: 2021-10-24
  Administered 2021-10-24: 1 mL via EPIDURAL

## 2021-10-24 ENCOUNTER — Ambulatory Visit: Payer: Medicare PPO | Attending: Anesthesiology | Admitting: Anesthesiology

## 2021-10-24 VITALS — BP 134/89 | HR 84 | Temp 96.3°F | Resp 16

## 2021-10-24 DIAGNOSIS — M4802 Spinal stenosis, cervical region: Secondary | ICD-10-CM | POA: Insufficient documentation

## 2021-10-24 DIAGNOSIS — M5412 Radiculopathy, cervical region: Secondary | ICD-10-CM | POA: Insufficient documentation

## 2021-10-24 MED ORDER — BETAMETHASONE SOD PHOS & ACET 6 (3-3) MG/ML IJ SUSP
INTRAMUSCULAR | Status: AC
Start: 2021-10-24 — End: 2021-10-24
  Filled 2021-10-24: qty 5

## 2021-10-24 NOTE — Progress Notes (Signed)
I was present for the entire procedure (cervical epidural steroid injection) which was performed under my direct personal supervision.   I reviewed the documentation of the other providers and concur with Dr. Everlean Alstrom findings. I edited the procedure note.      [name], MD  Medical Director   [facility]  [facility]   Department of Anesthesiology and Pain Medicine

## 2021-10-24 NOTE — Patient Instructions (Signed)
[facility]  Post-Procedure Discharge Instructions      After the procedure:  Immediate and complete pain relief is rare  Numbness and/or weakness in the area of your body supplied by the injected nerve; these symptoms should resolve but may last up to several hours  Some soreness and bruising at the injection site(s)    Activities:  If you have any weakness or numbness caused by the injection, DO NOT DRIVE or operate machinery and limit other activity until sensation returns to normal.  You may resume regular exercises/activities as tolerated.  If you received sedation, DO NOT DRIVE or operate machinery for 24 hours.    Medications:  If you stopped taking any blood thinning medications such as Coumadin or Plavix, you may resume these tomorrow unless specified differently by the prescribing physician.    Site care:  You may remove the band-aid after 6 hours.  You may shower today. No swimming, tub baths or hot tubs for 24 hours following your procedure.  For the first 48 hours, apply ice packs to the injection site for 15-20 minutes hourly as needed for comfort.  Wrap a light towel or cloth around ice packs and heating pad to protect the skin.  After 48 hours, use a warm heating pad to the injection site for 15-20 minutes hourly as needed for comfort.    If you received steroids today:  Steroid medications may cause facial flushing, occasional low-grade fevers, hiccups, insomnia, headaches, water retention, increased appetite, increased heart rate, and abdominal cramping or bloating. These side effects occur in only about 5 percent of patients and commonly disappear within one to three days after the injection.  If you are diabetic check your blood sugar more frequently than usual as you may develop an increase in blood sugar for the next 10-14 days. Contact your diabetes physician if this occurs.    Call us if you develop any of the following symptoms in the next 7 days:  Fever above 100 degrees  F     Any unusual increase in your level of pain  Swelling, bleeding, redness, or increased tenderness at the procedure or IV site  Headache not relieved by Tylenol (if you had an epidural steroid injection)  .    Contact us:  Anytime, 24 hours/day, 7 days/week at [phone]      [facility]  Patient Self-Administered Pain Diary  1 month    11/24/2021      The procedure you just had was done in hopes of providing long term pain relief. Depending on the type of procedure you had, you will need to answer the questions below 1 month after your procedure.  Accurate completion and timely reporting of your pain diary(s) enables your Pain MD to review the results and make further treatment recommendations.    1 month after your procedure, write your answers to the 4 questions below and send your pain score diary to [place] either by:      1)  Sending a picture via [employer]    2)  Faxing to [phone]  3)  Calling the pain score voicemail line at [phone].  Leave the spelling of your first and last name, U#, date of birth and date of procedure and the answers to all of the 4 questions below.             Use this scale to rate your pain  0 1 2 3 4 5 6 7 8  9  10  No pain                      worst pain        Procedure Date: 10/24/21    1.  Pain score immediately before procedure: __________    2. 1 month after your procedure, do you feel better?  YES/NO    3.  If you answered YES to the above, by what percentage__________% (0-100%) has your pain been relieved.    4.  Pain Score now: _________ (at time of telephone call)              [facility]  Patient Self-Administered Pain Diary  3 months    01/24/2022      The procedure you just had was done in hopes of providing long term pain relief. Depending on the type of procedure you had, you will need to answer the questions below 3 months after your procedure.  Accurate completion and timely reporting of your pain diary(s) enables your Pain MD to review the  results and make further treatment recommendations.    3 months after your procedure, write your answers to the 4 questions below and send your pain score diary to us either by:      1)  Sending a picture via [employer]    2)  Faxing to [phone]  3)  Calling the pain score voicemail line at [phone].  Leave the spelling of your first and last name, U#, date of birth and date of procedure and the answers to all of the 4 questions below.             Use this scale to rate your pain  0 1 2 3 4 5 6 7 8 9 10  No pain                      worst pain        Procedure Date: 10/24/21    1.  Pain score immediately before procedure: __________    2. 3 months after your procedure, do you feel better?  YES/NO    3.  If you answered YES to the above, by what percentage__________% (0-100%) has your pain been relieved.    4.  Pain Score now: _________ (at time of telephone call)

## 2021-10-24 NOTE — Progress Notes (Signed)
Pain score prior to procedure:  5/10  Pain score after procedure:  1/10    [name] met discharge criteria:  A/OX4/baseline, VSS/returned to baseline, ambulatory with steady gait. Site clean dry and intact.  Written and verbal discharge instructions, including emergency contact phone numbers, reviewed with [name]. Verbal understanding obtained from him.  [name] dressed self without assistance. He was discharged in stable condition, ambulatory with steady gait to home with all belongings and with ride/responsible person.

## 2021-10-24 NOTE — Progress Notes (Signed)
Ronnie Mason identified by name and DOB.   He confirmed he is having a Midline C6-7 ILESI  He ambulated to procedure area with steady gait.  He has not fallen in the last 6 months.  Fall prevention interventions: Patient provided with non-skid stockings  He denies taking any blood thinners.  He denies having a bleeding disorder.  He  Has held his medications per provider instructions.  He has not been on antibiotics for the past two weeks.   He denies a history of fainting during medical procedures.   He has not been NPO.  He undressed self without assistance.  Is He receiving a steroid injection today? yes  If yes, has He received the COVID-19 vaccine (either brand) within the past 2 weeks? No   OR does He plan to receive the COVID-19 vaccine (either brand) within the next 2 weeks? no  I verbally reviewed written discharge instructions and pain diary with him.   New consent on chart  Confirmed ride home with his wife, Ronnie Mason [phone]     He has a pre-procedure pain score of 5/10 (has been up to an 8) rad outside bl arm to forearm

## 2021-11-05 ENCOUNTER — Other Ambulatory Visit (HOSPITAL_BASED_OUTPATIENT_CLINIC_OR_DEPARTMENT_OTHER): Payer: Self-pay | Admitting: Psychiatry

## 2021-11-05 DIAGNOSIS — F1020 Alcohol dependence, uncomplicated: Secondary | ICD-10-CM

## 2021-11-05 MED ORDER — NALTREXONE HCL 50 MG OR TABS
ORAL_TABLET | ORAL | 1 refills | Status: DC
Start: 2021-11-05 — End: 2022-01-09

## 2021-11-23 NOTE — Progress Notes (Unsigned)
CHIEF COMPLAINT:   No chief complaint on file.      HISTORY OF PRESENT ILLNESS:  The purpose of today's visit is interval reevaluation of ***. The patient has a pain history significant for ***.     Previous evaluation by Dr. Marland Kitchenon ***:   ***    Previous evaluation by Dr. Marland Kitchenon ***:   ***    Previous evaluation by Dr. Marland Kitchenon ***:  ***    Previous evaluation by Dr. Marland Kitchenon ***:  ***      Interval History:  - ***   - ***   - ***   - ***   - ***   - ***     The pain can be described as follows:    Main source of pain: ***     Onset: ***   Radiation: ***   Character: ***   Intensity: ***/10 at its worst, ***/10 at its best, and ***/10 on average on the NRS   Worst pain: During the {TIME OF DAY:100139}  Duration/frequency: Persistent present daily. Episodic flares occurring *** times per month lasting *** per episode   Exacerbating factors: ***   Relieving factors: ***   Associated factors: ***   The impact of the pain includes: ***   Pain overview: Overall, the pain has been {course:17}. Treatment of pain {HAS/HAS NOT TRAVEL (HH):104826::"has not"} improved pain and function.      Other source(s) of pain: ***     Mood: *** Denies anxiety, depression  Sleep: {is/ is not:103980::"is"} disturbed by the pain. ***   Routine physical activity consists of: *** miles per day *** week    Patient has not yet tried: *** graded aerobic exercise and/or resistance training, acupunture, deep breathing, tai chi, mindfulnees meditation, CBT for insomnia, pain psychology, pain reprocessing therapy, TENS, ketamine infusions,  lidocaine infusions, PT, lidocaine patches, integrative medicine, or psychiatry referral     Current pertinent treatments    ***     Past pertinent treatments:  Procedures    ***     Surgeries    ***     Therapies/CAM   ***     Medications   Opioids: ***    Antiepileptics: ***    Calcium channel blockers: ***    Muscle relaxants: ***    Psychiatric: ***    Hypnotics/sedatives: ***    NSAIDS: ***   Topicals:  ***    Other: ***    Miscellaneous     ***     He feels that the most effective treatments are or have been ***    Treatments not tried:   CAM/alternative treatments: ***TPIs, acupuncture, chiropractic, massage, integrative medicine, PRP injections, prolotherapy    Neuromodulation: ***TENS, SCS, DRG stimulator, ReActiv8   Prescription medications/infusions: ***Ketamine infusions, lidocaine infusions, gabapentin, pregabalin, duloxetine, methocarbamol, metaxolone, cyclobenzaprine   Opioids: ***Morphine, hydrocodone, oxycodone, tramadol, butrans patch, suboxone, methadone, tapentadol, codeine, hydromorphone, oxymorphone, fentanyl (buccal/sublingual/patch)   OTC agents/supplements: ***Lidocaine patches, lidocaine cream, capsaicin cream, topical diclofenac gel, topical biofreeze/menthol cream, topical CBD, oral CBD, magnesium, riboflavin, Co-Q10   Therapies: ***Graded aerobic exercise and/or resistance training, PT, deep breathing, tai chi, mindfulnees meditation, CBT for insomnia, pain psychology, pain reprocessing therapy, psychiatry/behavioral health referral     Labs/Imaging Reviewed:  Lab Results   Component Value Date    WBC 8.01 01/15/2021    MCV 103 (H) 01/15/2021      Lab Results   Component Value Date    CREATININE 0.88 12/05/2020  Lab Results   Component Value Date    ALT 15 12/02/2020    AST 20 12/02/2020     Last urine drug screen results:   Results for orders placed or performed in visit on 07/23/21   Chronic Pain Drug Risk 1 w/Cnslt, Urine   Result Value Ref Range    Expected Drugs, URN SEE NOTES     Drug Screen Interp Comment, URN (NOTE)     Cocaine (Qual), URN Negative NRN    Opiates (Qual), URN Negative NRN    Amphet/Methamphetamine Qual,URN Negative NRN    Cannabinoids (Qual), URN Negative NRN    Barbiturate (Qual), URN Positive (A) NRN    Benzodiazepines (Qual), URN Negative NRN    Methadone (Qual), URN Negative NRN    Oxycodone (Qual), URN Negative NRN    Buprenorphine Qualitatiave, URN  Negative NRN    Tramadol Qual, URN Negative NRN    Fentanyl Qual, URN Negative NRN    Drug Screen Test Info, URN SEE NOTES         Last UDS findings as expected? {Yes / No ***:64::"YES"}  UDS ordered today? {Yes / No / ***:104994::"Yes"}  Opioid Risk Tool: {Tip  Required for any patient on chronic opioids (>12 weeks). I Click to input ORT. Then refresh (Ctrl+F11) to show score.  Frequency of periodic review and UDS is based on ORT - Quarterly for high risk (8 or above), twice a year for moderate risk (4-7), annually for low risk (0-3).   :999}  No ORT score found     {Labs  Imaging:999}  ***None    Past Medical History:   Diagnosis Date   . Pain of right hip joint 01/23/2016   . 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 Allergic rhinitis due to allergen    . Carotid Sinus Hypersensitivity    . Disc disorder of lumbosacral region    . Dizziness    . Heart murmur    . Hip injury    . HYPERTENSION     . HYPOTENSION     . Nosebleed    . Strep throat    . Stroke ([place])    . Syncope    . 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. URI (upper respiratory infection)        Past Surgical History:   Procedure Laterality Date   . L meniciscus Left 07/2017    by Dr Matthew Saras , re did left menisicus    . PR ANES; COLONOSCOPY  2002    repeat in 3 years   . PR ANES; COLONOSCOPY & POLYPECTOMY  11/18/2007    repeat in 3 years   . PR HALLUX RIGIDUS W/CHEILECTOMY 1ST MP JT W/O IMPLT Right 10/21/2017    Dr. Megan Salon   . PR UNLISTED PROCEDURE FEMUR/KNEE     . PR UNLISTED PROCEDURE HANDS/FINGERS     . PR UNLISTED PROCEDURE SPINE  2013    rfa to c3 to c 7    . TOE SURGERY Right 02/09/2019   . toe surgery  Right 01/12/2019    Dr Wilmon Arms        Current Outpatient Medications   Medication Sig Dispense Refill   . Acetaminophen 500 MG Oral Tab Take 2 tablets (1,000 mg) by mouth 2 times a day.     . Alpha-Lipoic Acid 300 MG tablet Take 1 tablet by mouth daily.     [name]  amLODIPine 5 MG tablet Take 1 tablet (5 mg) by mouth 2 times a day. 180 tablet 3   . atorvastatin 10 MG tablet Take 1 tablet (10 mg) by mouth daily. 90 tablet 3   . BENFOTIAMINE OR Take 250 mg by mouth daily.     . betamethasone dipropionate 0.05 % lotion Apply topically 2 times a day. Apply to scalp and ears as needed for itching 60 mL 11   . Cholecalciferol (VITAMIN D3) 2000 units Oral Cap Take 1 capsule (2,000 Units) by mouth daily. For low level 1 capsule 1   . CLINDAMYCIN HCL OR Take by mouth.     . Cyanocobalamin 1000 MCG Oral Tab one per day over-the-counter started 5/21 /2012 this level borderline low 1 Tab 1   . diclofenac 1 % gel Apply 4 g topically 4 times a day. Apply to 2 gram four times daily to neck, trapezius muscle, and low back. Use a max of 32 grams a day. 300 g 4   . DOXYCYCLINE OR Take by mouth.     . DULoxetine 60 MG DR capsule Take 1 capsule (60 mg) by mouth 2 times a day. 180 capsule 2   . EPINEPHrine 0.3 MG/0.3ML auto-injector INJECT INTRAMUSCULARLY ONCE AS NEEDED FOR SEVERE ALLERGIC REACTION 4 each 3   . ibuprofen 200 MG tablet Take 1 tablet (200 mg) by mouth daily as needed (pain).     [name] lidocaine 5 % patch Apply 1 patch onto the skin daily. Apply to painful area for up to 12 hours in a 24 hour period. (Patient taking differently: Apply 1 patch onto the skin daily as needed. Apply to painful area for up to 12 hours in a 24 hour period.) 30 patch 5   . lisinopril 20 MG tablet Take 1 tablet (20 mg) by mouth every 12 hours. 180 tablet 1   . naltrexone 50 MG tablet take 1 tablet by mouth once daily 30 tablet 1   . Omega-3 Fatty Acids (OMEGA 3 OR) Take 1 tablet by mouth daily.     . pregabalin 300 MG capsule Take 1 capsule (300 mg) by mouth 2 times a day. 180 capsule 1   . primidone 50 MG tablet Take 1 tablet (50 mg) by mouth 2 times a day. 180 tablet 4     No current facility-administered medications for this visit.       Review of patient's allergies indicates:  Allergies   Allergen Reactions    . Adhesives Skin: Rash     Glue on lido patches   . Bee Venom Skin: Hives, Skin: Itching and Swelling   . Gabapentin Other     Flu like symptoms, diarrhea, body ache upset stomach   . Methocarbamol Other     Patient's wife reports cognitive difficulties ("goofy")       Social History     Tobacco Use   . Smoking status: Never   . Smokeless tobacco: Former   Substance Use Topics   . Alcohol use: Yes     Alcohol/week: 14.0 standard drinks     Types: 14 Glasses of wine per week     Comment: goal to get down to 2 per day   . Drug use: Yes     Frequency: 3.0 times per week     Types: Marijuana     Comment: CBD (Occassionally)       Family History     Problem (# of Occurrences) Relation (Name,Age of Onset)  Heart (other) (1) Mother: MI    Other Family Hx (1) Other: myelodysplasia    Colon Cancer (1) Father       Negative family history of: Cancer        {Substance & Sexual Hx:999}    REVIEW OF SYSTEMS:  The review of systems documented by our clinic staff was provided by the patient. I reviewed and confirmed these details with Ronnie Mason and {pain clinic ROS comments:112481}    PHYSICAL EXAMINATION:  There were no vitals taken for this visit.   General: Well developed, no acute distress   Eyes: Pupils symmetric, sclerae non-icteric  Neck: Supple, no cervical or supraclavicular lymphadenopathy  CV: RRR, no murmurs, rubs, or gallops auscultated.     Respiratory: Nonlabored respirations, lungs CTA BL, no wheezing, no crackles  Integument: No sclerodactyly, telangiectasias or digital pits/ulcers  Nails:  No nail pitting, onycholysis, or clubbing  Back:  Spine normal without deformity or tenderness, no spinous or sacroiliac tenderness  Extremities: No LE edema, no cyanosis or clubbing  Psychiatric:  Good judgment and insight, normal mood and affect.   Neuro:  Sensory examination: {sensory :111185::"normal to light touch, pinprick, and vibration sensation"} in all extremities {EXCEPTIONS:102922::"except for  ***."}  Allodynia: {ABSENT/PRESENT:102485}    Hyperalgesia:{ABSENT/PRESENT:102485}  Dysesthesia: {ABSENT/PRESENT:102485}  Motor strength: {.:108682::"5/5 in all muscle groups of the upper and lower extremities"} {EXCEPTIONS:102922::"except for ***."} {EXCEPTIONS:102922::"except for ***."}   MSK:   GAIT & STATION:  {.:111187::"normal gait, can toe, heel, and tandem walk","Romberg intact"}  Unipedal stance: {NRML/BORDERLINE/ABNL/IMPAIRED:100766::"Normal"}   {can/cannot:109993} squat and rise  SPINE:  {SPINE APPEARANCE:101481}  Lumbar curvature: ***  Lumbar active range of motion:   Forward flexion: fingers to {brslumbarromsmartlist:111189::"toes without pain"}; Extension:  ***; {PAIN/NOPAIN:102487}  Right rotation + extension: {PAIN/NOPAIN:102487}, Left rotation + extension: {PAIN/NOPAIN:102487}  Cervical spine:   Curvature:  ***  Cervical active range of motion:  Flexion: ***; {PAIN/NOPAIN:102487}, Extension:  ***; {PAIN/NOPAIN:102487}  Right rotation + extension: {PAIN/NOPAIN:102487}, Left rotation + extension: {PAIN/NOPAIN:102487}  Spurling's: Left: {POSITIVE/NEGATIVE Neg is defaulted:14::"negative"} Right: {POSITIVE/NEGATIVE Neg is defaulted:14::"negative"}  Shoulders: {RANGE OF MOTION:100560}   Hips: {RANGE OF MOTION:100560}    Straight Leg Raise (estimated degrees):  Right: {STRAIGHT LEG RAISE:340234} at {DEGREE 0-60:102504}   Left: {STRAIGHT LEG RAISE:340234} at {DEGREE 0-60:102504}   Lumbopelvic examination: FABER: {Positive/Negative/Wildcard:109442}  Myofascial tenderness: ***    Visit History:    [place] Prescription Monitoring Program Review:  [place] Medication Dispense History (from 11/28/2020 to 11/05/2021)     Dispensed Days Supply Quantity   PREGABALIN 300 MG CAPSULE 08/27/2021 90 180 unspecified   PREGABALIN 300 MG CAPSULE 06/25/2021 60 120 unspecified   PREGABALIN 300 MG CAPSULE 05/19/2021 30 60 unspecified   PREGABALIN 300 MG CAPSULE 04/18/2021 30 60 unspecified   PREGABALIN 300 MG CAPSULE 03/20/2021  30 60 unspecified   PREGABALIN 300 MG CAPSULE 02/19/2021 30 60 unspecified   HYDROCODONE-ACETAMIN 5-325 MG 02/08/2021 6 20 unspecified   HYDROCODONE-ACETAMIN 5-325 MG 02/05/2021 4 10 unspecified   PREGABALIN 300 MG CAPSULE 01/18/2021 30 60 unspecified   PREGABALIN 300 MG CAPSULE 12/16/2020 30 60 unspecified   OXYCODONE HCL (IR) 5 MG TABLET 12/10/2020 4 30 unspecified      Current Daily Morphine Equivalent Dose:    11/28/2021: ***     Patient *** endorses significant improvement in pain and physical function from his current opioid regimen at the aforementioned daily morphine equivalent dose with***out uncontrolled side effects.    Past Visits:  ***:    IMPRESSION:   Ronnie Mason is a 71 year old male with   1. ***   2. Prior treatment failures including ***  3. Significant medical comorbidities significant for ***  Patient Active Problem List    Diagnosis Date Noted   . Syncope [R55]    . Hypotension [I95.9]    . 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 URI (upper respiratory infection) [J06.9]    . Chronic bilateral low back pain without sciatica [M54.50, G89.29] 04/29/2021   . Psychological factors affecting medical condition [F54] 04/24/2021   . Cellulitis and abscess of foot [L03.119, L02.619] 12/02/2020     Added automatically from request for surgery [id number]     . Macrocytosis without anaemia [D75.89] 02/12/2020   . Myofascial pain [M79.18] 10/20/2019   . MRSA (methicillin resistant staph aureus) culture positive [Z22.322] 07/13/2019   . Closed fracture of one rib of right side [S22.31XA] 01/26/2019     See x ray 01/26/2019   Right anterior lat sub acute chronic      . Enuresis [R32] 09/06/2018   . Chronic left-sided low back pain without sciatica [M54.50, G89.29] 06/21/2018   . Acute left-sided low back pain without sciatica [M54.50] 03/14/2018   . Greater trochanteric pain syndrome of right lower extremity [M25.551] 11/25/2017   . Complex tear of medial meniscus of left knee as current injury  [S83.232A] 09/08/2017   . Primary osteoarthritis of left knee [M17.12] 09/08/2017   . Right lumbar radiculitis [M54.16] 05/05/2017   . 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] 01/11/2017     Osteoarthritis of right hip x ray report - moderate to advanced radiology read 01/11/2017     . 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([place]) [S06.9XAA] 08/06/2016   . Impaired mobility and ADLs [Z74.09, Z78.9] 04/08/2016   . Difficulty in walking, not elsewhere classified [R26.2] 03/12/2016   . Vitamin D deficiency [E55.9] 02/13/2016   . Balance problem [R26.89] 01/23/2016   . Chronic pain of both shoulders [M25.511, G89.29, M25.512] 01/23/2016     X ray arthritis  02/19/2016       . 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 Intraparenchymal hemorrhage of brain ([place]) [I61.9] 01/06/2016   . 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 Essential hypertension [I10] 12/16/2015   . Essential tremor [G25.0] 12/16/2015   . Hx of spontan intraparenchymal intracran bleed assoc with hypertension [Z86.79] 12/16/2015     Gantt -[place] 4/29- 12/10/2015  Non-traumatic intraparenchymal hemorrhage, hypertensive of the left thalamus   Traumatic hemorrhage   -- falcine subdural hemorrhage, small cortical subarachnoid     . Alcohol abuse [F10.10] 12/16/2015   . 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 History of alcohol dependence ([place]) [F10.21] 06/26/2015   . 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 ([place]) [G37.9] 05/07/2015   . Idiopathic peripheral neuropathy [G60.9] 03/15/2015   . 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 Tremor [R25.1] 04/20/2013   . Fall [W19.XXXA] 02/23/2013     BP < 100  Occasioanal tri[p and leg weakness     . Back pain, lumbosacral  [M54.50] 02/15/2013   . Lumbar radiculopathy, chronic [M54.16] 02/15/2013   . Neck pain [M54.2] 02/15/2013   . Chronic pain [G89.29] 12/21/2012     Now at [facility], DO, Robbie Lis  Neck pain -burn c3-7 on left side per patient  Some trigger injection    Lumbar pain-since 80's better with exercise     . Bee sting allergy [Z91.030] 12/21/2012     hives     . Numbness and tingling of leg [R20.0, R20.2] 12/21/2012     Left calf -left foot long term suspect from back-suspect L 45     . Chronic back pain [M54.9, G89.29] 12/21/2012     Mri in mid scape 1/214  Multilevel degenerative disc disease of the lumbar spine. Circumferential disc bulge at all levels below and including L1-2. No significant central spinal canal stenosis or neuroforaminal narrowing.  2. Mild retrolisthesis of L5 on S1.           . B12 deficiency [E53.8] 12/21/2012     Borderline low 5 /21/2014     . Chronic renal insufficiency, stage III (moderate) ([place]) [N18.30] 12/21/2012     5/ 2013     . Hyperlipidemia [E78.5] 09/26/2012   . Cervicalgia [M54.2] 03/11/2010     Radiofrequency ablation of c3 to c 7         PLAN:   {Problem List  Meds & Orders  LOS Calculator:999}  We discussed our impressions and the following suggestions/options in detail and  provided him with information in the after visit summary.  Mr. Antunes had no further questions.      Test     Diagnostic workup   ***     Medication    ***       Rehabilitation   ***    Start physical therapy can reduce pain and improve functional status.     We discussed a physical therapy approach focused on gradual, progressive stretching and strengthening and developing an independent home exercise program     Mental health   ***    Consider mindfulness meditation app (e.g. "Calm" app)   Consider biofeedback training (e.g. HeartMath device vs cell phone app)       Complimentary and alternative medicine   ***    Consider trial of OTC Magnesium (e.g. tricomplex formulation: Mg  L-threonate, Mg Glycinate, and Mg Taurate)     Consider acupuncture   [aapaindiet]   [aabooks]    Interventional treatments   ***     Coordination of care   Mr. Champoux is recommended to follow up with his PCP, Langley Adie, MD and other involved health care providers to coordinate the above care plan    Next follow up: ***

## 2021-11-28 ENCOUNTER — Encounter (HOSPITAL_BASED_OUTPATIENT_CLINIC_OR_DEPARTMENT_OTHER): Payer: Self-pay | Admitting: Anesthesiology

## 2021-11-28 ENCOUNTER — Ambulatory Visit: Payer: Medicare PPO | Attending: Anesthesiology | Admitting: Anesthesiology

## 2021-11-28 VITALS — BP 133/87 | HR 73 | Temp 97.5°F | Ht 76.0 in | Wt 225.0 lb

## 2021-11-28 DIAGNOSIS — M5412 Radiculopathy, cervical region: Secondary | ICD-10-CM | POA: Insufficient documentation

## 2021-11-28 DIAGNOSIS — M545 Low back pain, unspecified: Secondary | ICD-10-CM | POA: Insufficient documentation

## 2021-11-28 DIAGNOSIS — M542 Cervicalgia: Secondary | ICD-10-CM | POA: Insufficient documentation

## 2021-11-28 DIAGNOSIS — M7918 Myalgia, other site: Secondary | ICD-10-CM | POA: Insufficient documentation

## 2021-11-28 DIAGNOSIS — G8929 Other chronic pain: Secondary | ICD-10-CM | POA: Insufficient documentation

## 2021-11-28 NOTE — Progress Notes (Signed)
Review of Systems   Constitutional: Negative.    HENT: Negative.    Eyes: Negative.    Respiratory: Negative.    Cardiovascular: Negative.    Gastrointestinal: Negative.    Endocrine: Negative.    Genitourinary: Negative.    Musculoskeletal: Positive for arthralgias, back pain, gait problem, neck pain and neck stiffness.   Skin: Negative.    Allergic/Immunologic: Negative.    Hematological: Negative.    Psychiatric/Behavioral: Negative.

## 2021-11-28 NOTE — Progress Notes (Signed)
I saw and evaluated the patient with Dr. Sinclair Grooms, who conducted the initial history.  I reviewed and confirmed the history in detail with Mr. Schinke in person.   I was present for the examination and formulation portions of the encounter. I agree with the findings and the plan of care as documented in our notes. I did edit the trainee's note.      [name], MD  Medical Director   [facility]  [facility]   Department of Anesthesiology and Pain Medicine

## 2021-12-17 ENCOUNTER — Telehealth (HOSPITAL_BASED_OUTPATIENT_CLINIC_OR_DEPARTMENT_OTHER): Payer: Self-pay | Admitting: Neurology

## 2021-12-17 NOTE — Telephone Encounter (Signed)
RETURN CALL: Voicemail - Detailed Message      SUBJECT:  Appointment Request     REASON FOR VISIT: Follow up with Dr.Williams and needs updated testing  PREFERRED DATE/TIME: Open   ADDITIONAL INFORMATION:Patient was seen over 3 years ago and spouse requesting call back for scheduling. [facility] unable to connect with clinic per s/m. Please call

## 2021-12-18 ENCOUNTER — Ambulatory Visit (INDEPENDENT_AMBULATORY_CARE_PROVIDER_SITE_OTHER): Payer: Medicare PPO | Admitting: Naturopathic

## 2021-12-18 ENCOUNTER — Encounter (INDEPENDENT_AMBULATORY_CARE_PROVIDER_SITE_OTHER): Payer: Self-pay | Admitting: Naturopathic

## 2021-12-18 DIAGNOSIS — G8929 Other chronic pain: Secondary | ICD-10-CM

## 2021-12-18 DIAGNOSIS — M5412 Radiculopathy, cervical region: Secondary | ICD-10-CM

## 2021-12-18 DIAGNOSIS — M542 Cervicalgia: Secondary | ICD-10-CM

## 2021-12-18 DIAGNOSIS — M545 Low back pain, unspecified: Secondary | ICD-10-CM

## 2021-12-18 NOTE — Progress Notes (Signed)
[facility]    Acupuncture Consult    Referred by: Art Buff, MD     PCP: Donnal Moat, MD    HPI: Ronnie Mason was referred to acupuncture and [facility] for consultation and treatment of    Chief Complaint   Patient presents with    Consultation     Acupuncture for bilateral low back pain without sciatica, cervicalgia, cervical radiculopathy - sxs present for 20+ years. Low back originally injured 54. Pt reports having previous acupuncture treatment for his back in 2000.     CC1: Neck Pain   - C3-6 multiple epidural 20 years ago. Less said the cause was also unknown and atraumatic. Pain did not bother him again until last 6 months without any traumatic injury. 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   - Two months ago, had epidural in C6-7 was the recent epidural steroids, which took away half of the pain; before that he couldn't turn and drive; will get another one in 2-3 weeks (June 9th).   - Strength of the left hand is weak and shakes a lot at rest or trying to grab something; has to grab/hold his arm until it stops; only able to hold about a can of soup. Right arm also feels weaker but not shaky.   - Has not impacted sleep   - Radiation/numbness: from top of shoulder down to the elbow. None on the right.   - No radiating pain on the right   - Uses KT tape for shoulder and back.     CC2: Chronic Back Pain   - Was doing PT but had to pause it due to running of benefits. Will resume first of August. PT has been helping.   - Muscles on the back has gotten really tight centrally (feels like hard rocks)   - Use ice/heat and TENS unit as needed   - July 21 will get trigger point injections for low back pain   - Better with movement in general and working out in the pool also felt better. Has a recent approval from his foot doctor to return to pool work  outs.    - Does not bother sleep or driving   - Pain when stepping out of the car; has to be careful going from seated to standing   - Can probably walk 3-4 blocks right now     Functional Limitations:  1) Arm/hand shaking on the left and only able to hold a can of soup.  2) Pain getting out of the car   3) Difficulty from seated to standing   4) Only able to walk 3-4 blocks in distance     Contraindications to Care: currently there are no red flags or contraindications to the conservative care outlined in this treatment plan.  - Not on an anticoagulant medication   - No personal or family history of bleeding or clotting disorder   - Is not currently on immunosuppressive therapy, chemo, or radiation   - Does not have metal implants (contraindicated for electroacupuncture nearby) - may have a plate on the right foot (big toe)   - Does not have a pacemaker (contraindicated for electroacupuncture nearby)     Ronnie Mason has received acupuncture before and found it beneficial for low back pain in the past.   He has never received cupping.  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    Allergic rhinitis due to allergen     Carotid Sinus Hypersensitivity     Disc disorder of lumbosacral region     Dizziness     Heart murmur     Hip injury     HYPERTENSION      HYPOTENSION      Nosebleed     Strep throat     Stroke [facility](HCC)     Syncope     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(HCC)     URI (upper respiratory infection)         Surgical History:  Past Surgical History:   Procedure Laterality Date    L meniciscus Left 07/2017    by Dr Marcelle OverlieHolland , re did left menisicus     PR ANES; COLONOSCOPY  2002    repeat in 3 years    PR ANES; COLONOSCOPY & POLYPECTOMY  11/18/2007    repeat in 3 years    PR HALLUX RIGIDUS W/CHEILECTOMY 1ST MP JT W/O IMPLT Right 10/21/2017     Dr. Donn PieriniBrian McInnes    PR UNLISTED PROCEDURE FEMUR/KNEE      PR UNLISTED PROCEDURE HANDS/FINGERS      PR UNLISTED PROCEDURE SPINE  2013    rfa to c3 to c 7     TOE SURGERY Right 02/09/2019    toe surgery  Right 01/12/2019    Dr Hilarie Fredricksonony Kim         Medications:  Outpatient Medications Prior to Visit   Medication Sig Dispense Refill    Acetaminophen 500 MG Oral Tab Take 2 tablets (1,000 mg) by mouth 2 times a day.      Alpha-Lipoic Acid 300 MG tablet Take 1 tablet by mouth daily.      amLODIPine 5 MG tablet Take 1 tablet (5 mg) by mouth 2 times a day. 180 tablet 3    atorvastatin 10 MG tablet Take 1 tablet (10 mg) by mouth daily. 90 tablet 3    BENFOTIAMINE OR Take 250 mg by mouth daily.      betamethasone dipropionate 0.05 % lotion Apply topically 2 times a day. Apply to scalp and ears as needed for itching 60 mL 11    Cholecalciferol (VITAMIN D3) 2000 units Oral Cap Take 1 capsule (2,000 Units) by mouth daily. For low level 1 capsule 1    CLINDAMYCIN HCL OR Take by mouth.      Cyanocobalamin 1000 MCG Oral Tab one per day over-the-counter started 5/21 /2012 this level borderline low 1 Tab 1    diclofenac 1 % gel Apply 4 g topically 4 times a day. Apply to 2 gram four times daily to neck, trapezius muscle, and low back. Use a max of 32 grams a day. 300 g 4    DOXYCYCLINE OR Take by mouth.      DULoxetine 60 MG DR capsule Take 1 capsule (60 mg) by mouth 2 times a day. 180 capsule 2    EPINEPHrine 0.3 MG/0.3ML auto-injector INJECT INTRAMUSCULARLY ONCE AS NEEDED FOR SEVERE ALLERGIC REACTION 4 each 3    ibuprofen 200 MG tablet Take 1 tablet (200 mg) by mouth daily as needed (pain).      lidocaine 5 % patch Apply 1 patch onto the skin daily. Apply  to painful area for up to 12 hours in a 24 hour period. (Patient taking differently: Apply 1 patch onto the skin daily as needed. Apply to painful area for up to 12 hours in a 24 hour period.) 30 patch 5    lisinopril 20 MG tablet Take 1 tablet (20 mg) by mouth every 12 hours. 180  tablet 1    naltrexone 50 MG tablet take 1 tablet by mouth once daily 30 tablet 1    Omega-3 Fatty Acids (OMEGA 3 OR) Take 1 tablet by mouth daily.      pregabalin 300 MG capsule Take 1 capsule (300 mg) by mouth 2 times a day. 180 capsule 1    primidone 50 MG tablet Take 1 tablet (50 mg) by mouth 2 times a day. 180 tablet 4     No facility-administered medications prior to visit.        Allergies:  Review of patient's allergies indicates:  Allergies   Allergen Reactions    Adhesives Skin: Rash     Glue on lido patches    Bee Venom Skin: Hives, Skin: Itching and Swelling    Gabapentin Other     Flu like symptoms, diarrhea, body ache upset stomach    Methocarbamol Other     Patient's wife reports cognitive difficulties ("goofy")        Family/Social history:  family history includes Colon Cancer in his father; Heart (other) in his mother; Other Family Hx in an other family member. There is no history of Cancer.       OBJECTIVE:  Vitals:  There were no vitals taken for this visit.  Pulse: wiry, choppy, slippery   Tongue: Deferred. Patient wearing a face covering.    MSK: gait and station intact  AROM:   - C Spine: limited rotation and severely limited side-bending and extension   - L Spine: severely limited in extension, not full rotation and side-bending due to discomfort   - TTP in lower thoracic and upper lumbar facets; increased tension of paraspinals in thoracic and lumbar spine     Imaging/Lab Reviewed:     09/03/2021 MRI C Spine wo Contrast   IMPRESSION  1. Multilevel lumbar spondylosis as detailed above more prominence at C4-C5 where there is mild to moderate central and moderate bilateral foraminal narrowing.  2. Multilevel moderate to severe neural foraminal stenosis at other levels as detailed above.    07/23/2021 XR C Spine 4-5 Vw  FINDINGS AND IMPRESSION  Mild anterolisthesis of C4 on C5. There is 2 mm of increased anterolisthesis with flexion.  No acute fracture or traumatic subluxation. Mild disc height  loss at C5-C6 with a posterior disc osteophyte complex.  No prevertebral soft tissue swelling.    02/05/2021 XR L Spine 2-3 Vw  FINDINGS/IMPRESSION:  Please note that the lateral radiograph is suboptimal in quality. There is a convex curvature of the lumbar spine measuring 12 degrees (measured from T12 to L3), with mild bilateral listhesis of L3 on L4. AP Alignment is grossly anatomic.  No acute fracture or traumatic subluxation.  Vertebral body heights are grossly preserved.  Multilevel degenerative changes are present including loss of disc height, associated endplate sclerosis, and osteophytosis.  Facet arthropathy is most significant at L5-S1.      Assessment/Plan:    Following an evaluation including the history of present illness/s, medical history and examination, the patient's clinical presentation suggest probable:    (M54.50,  G89.29) Chronic bilateral low back pain without sciatica    (  M54.2) Cervicalgia    (M54.12) Cervical radiculopathy    Diagnosis According to [place] Medicine:  Qi stagnation and Blood stasis in UB Channel     Treatment Principle/Goals:   Short-term: reduce muscle hypertonicity, decrease discomfort/pain, improve circulation and increase range of motion  Long-term: avoid the development of chronic pain syndrome and minimize probability of future exacerbations.    I introduced acupuncture and [place] medicine to the patient and discussed the potential benefits, limitations and risks associated with the treatments. Patient received notification of the provider's scope of practice and common side effects that may be associated with treatment. Patient understands that the services and techniques the Acupuncture and [place] medicine practitioner is authorized to provide will not resolve their underlying potentially serious disorder(s). Patient acknowledged that failure to pursue treatment from a primary health care provider for their underlying disorder may involve serious risks. Patient  verbalized understanding, had pertinent questions answered by me and agreed to proceed with acupuncture/AEMP treatment.    Possible Side Effects: The following side effects may occur and are not limited to the following: some pain, numbness, tingling; minor bleeding; minor bruising; blistering, skin discoloration; aggravation of symptoms prior to acupuncture treatment; minor burns; infection; needle sickness: feeling faint or dizzy; fainting, vomiting.  Unusual risks include, but are not limited to: broken needle, spontaneous miscarriage, nerve damage, and organ puncture, including pneumothorax.    Patient has been instructed to call and check in with their insurance regarding acupuncture benefits, including conditions covered as well as number of visits per year.     Discussed the mechanism of action of cupping.     Asked patient to ask practitioner who will be doing the epidural and trigger point injections to see if it conflicts with acupuncture treatments of if there are particular things to avoid doing after these procedures and for how long.       Patient tolerated this procedure well and without complications.     Time spent for face-to-face for evaluation and management was 31 minutes.     Total time spent for today's care, including review of previous medical records and creation of care plan: 45 minutes.       Prognosis / Future Plan / Recommendations:     The following factors may necessitate an increase in frequency or total duration of treatment:   - Due to the chronicity and the structural changes underneath, acupuncture may be beneficial as adjunctive treatment to improve pain and increase function but will not change the underlying structures.     Prognosis: the patient's progress will be closely monitored and if no improvement with this series of treatment, a referral will be recommended.    Based on clinical presentation, acupuncture and [place] medicine could be beneficial for this patient.  I  recommend acupuncture on a weekly basis, and re-evaluate after 8 treatments.                Wu-Hsun Hermenia Bers, ND, LAc  Licensed Acupuncturist, Teaching Associate  [facility]  Department of [facility] medicine  [place] [facility] [place]

## 2021-12-22 ENCOUNTER — Telehealth (INDEPENDENT_AMBULATORY_CARE_PROVIDER_SITE_OTHER): Payer: Self-pay | Admitting: Internal Medicine

## 2021-12-22 DIAGNOSIS — G8929 Other chronic pain: Secondary | ICD-10-CM

## 2021-12-22 DIAGNOSIS — M542 Cervicalgia: Secondary | ICD-10-CM

## 2021-12-22 NOTE — Telephone Encounter (Signed)
RETURN CALL: Voicemail - General Message      SUBJECT:  Referral Request/Questions      REASON FOR REFERRAL: NPA - thinks things are changing  NAME OF CLINIC/SPECIALTY: [facility]  PROVIDER:Dr. Fransisca Connors    ADDITIONAL INFORMATION:    They are requesting a new referral because it has been over 4 years.     Please call patients wife once this has been done.

## 2021-12-22 NOTE — Telephone Encounter (Signed)
Patient has reached out to [place] by Telephone requesting a new Neurosurgery referral for this patient's NPA - thinks things are changing  to be sent to [facility].      Patient has been seen for this previously.  They were last seen in this clinic on 07/23/2021.      Referral is pended routing to provider.      Please send response back to [facility] REFERRAL POOL [[phone]]

## 2021-12-23 NOTE — Telephone Encounter (Signed)
Done

## 2021-12-24 ENCOUNTER — Telehealth (INDEPENDENT_AMBULATORY_CARE_PROVIDER_SITE_OTHER): Payer: Self-pay | Admitting: Internal Medicine

## 2021-12-24 NOTE — Telephone Encounter (Signed)
Could you please give him an appointment with one of [place]   As he wanted referral for neurosurgery and further eval is needed

## 2021-12-24 NOTE — Telephone Encounter (Signed)
12/23/2021 Referral to Neurosurgery forwarded to [facility].   Phone number to call and schedule: [phone]    Patient's wife notified via Text         Closing TE

## 2021-12-25 ENCOUNTER — Ambulatory Visit (INDEPENDENT_AMBULATORY_CARE_PROVIDER_SITE_OTHER): Payer: Medicare PPO | Admitting: Acupuncturist

## 2021-12-25 VITALS — BP 132/82 | HR 73 | Resp 16

## 2021-12-25 DIAGNOSIS — G8929 Other chronic pain: Secondary | ICD-10-CM

## 2021-12-25 DIAGNOSIS — M545 Low back pain, unspecified: Secondary | ICD-10-CM

## 2021-12-25 NOTE — Progress Notes (Signed)
[facility]    Acupuncture Consult    Referred by: No ref. provider found, MD     PCP: Shann MedalKraft, Vara Viseskul, MD    HPI: Ronnie GranaDavid Paul Mason was referred to acupuncture and [place] medicine for consultation and treatment of choric lower back pain.     Chief Complaint   Patient presents with    Acupuncture     Bilateral low back pain without sciatica       *After the intake, Ronnie Mason got ready for a prone position for the treatment. When he lay on his stomach, he noted he started having nose bleeding. Bleeding was significant and I suggested to Ronnie Mason to seat and wait for the bleeding to stop.  We assess his blood pressure and SpO2. Ronnie Mason noted he has been prone position for massage treatment several time before but never had nose bleeding like this before and noting caught him by surprise.  After applying pressure (pinching his nose and looking down), the bleeding has stopped.   BP was 132/82, SpO2 was around 91-92% then went down to 87%.  Ronnie Mason noted he is feeling fine, but I suggested not to do the acupuncture today and advised him to get a medical assessment at the Urgent Care. Our MA Ronnie Mason accompanied him to Urgent Care downstairs, and noted SpO2 went up to 92%.       Intake Note  CC1: Chronic Back Pain (We reviewed the initial intake note together)   - Was doing PT but had to pause it due to running of benefits. Will resume first of August. PT has been helping.   - Muscles on the back has gotten really tight centrally (feels like hard rocks)   - Use ice/heat and TENS unit as needed   - July 21 will get trigger point injections for low back pain   - Pain when stepping out of the car; has to be careful going from seated to standing   - Today's pain scale: 4-6/10 wit 10 = the worst pain.   - Aggravating factor  - Twist body, could cause left lower back spasm  - Heating pad helps, uses morning   Related MSK issue: Neck and shoulder pain    Functional Limitations:  1)  Arm/hand shaking on the left and only able to hold a can of soup.  2) Pain getting out of the car   3) Difficulty from seated to standing   4) Only able to walk 3-4 blocks in distance     Contraindications to Care: currently there are no red flags or contraindications to the conservative care outlined in this treatment plan.  - Not on an anticoagulant medication   - No personal or family history of bleeding or clotting disorder   - Is not currently on immunosuppressive therapy, chemo, or radiation   - Does not have metal implants (contraindicated for electroacupuncture nearby) - may have a plate on the right foot (big toe)   - Does not have a pacemaker (contraindicated for electroacupuncture nearby)     Ronnie Mason has received acupuncture before and found it beneficial for low back pain in the past.   He has never received cupping.     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  Allergic rhinitis due to allergen     Carotid Sinus Hypersensitivity     Disc disorder of lumbosacral region     Dizziness     Heart murmur     Hip injury     HYPERTENSION      HYPOTENSION      Nosebleed     Strep throat     Stroke [facility])     Syncope     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(HCC)     URI (upper respiratory infection)         Surgical History:  Past Surgical History:   Procedure Laterality Date    L meniciscus Left 07/2017    by Dr Marcelle Overlie , re did left menisicus     PR ANES; COLONOSCOPY  2002    repeat in 3 years    PR ANES; COLONOSCOPY & POLYPECTOMY  11/18/2007    repeat in 3 years    PR HALLUX RIGIDUS W/CHEILECTOMY 1ST MP JT W/O IMPLT Right 10/21/2017    Dr. Donn Pierini    PR UNLISTED PROCEDURE FEMUR/KNEE      PR UNLISTED PROCEDURE HANDS/FINGERS      PR UNLISTED PROCEDURE SPINE  2013    rfa to c3 to c 7     TOE SURGERY Right 02/09/2019    toe surgery   Right 01/12/2019    Dr Hilarie Fredrickson         Medications:  Outpatient Medications Prior to Visit   Medication Sig Dispense Refill    Acetaminophen 500 MG Oral Tab Take 2 tablets (1,000 mg) by mouth 2 times a day.      Alpha-Lipoic Acid 300 MG tablet Take 1 tablet by mouth daily.      amLODIPine 5 MG tablet Take 1 tablet (5 mg) by mouth 2 times a day. 180 tablet 3    atorvastatin 10 MG tablet Take 1 tablet (10 mg) by mouth daily. 90 tablet 3    BENFOTIAMINE OR Take 250 mg by mouth daily.      betamethasone dipropionate 0.05 % lotion Apply topically 2 times a day. Apply to scalp and ears as needed for itching 60 mL 11    Cholecalciferol (VITAMIN D3) 2000 units Oral Cap Take 1 capsule (2,000 Units) by mouth daily. For low level 1 capsule 1    CLINDAMYCIN HCL OR Take by mouth.      Cyanocobalamin 1000 MCG Oral Tab one per day over-the-counter started 5/21 /2012 this level borderline low 1 Tab 1    diclofenac 1 % gel Apply 4 g topically 4 times a day. Apply to 2 gram four times daily to neck, trapezius muscle, and low back. Use a max of 32 grams a day. 300 g 4    DOXYCYCLINE OR Take by mouth.      DULoxetine 60 MG DR capsule Take 1 capsule (60 mg) by mouth 2 times a day. 180 capsule 2    EPINEPHrine 0.3 MG/0.3ML auto-injector INJECT INTRAMUSCULARLY ONCE AS NEEDED FOR SEVERE ALLERGIC REACTION 4 each 3    ibuprofen 200 MG tablet Take 1 tablet (200 mg) by mouth daily as needed (pain).      lidocaine 5 % patch Apply 1 patch onto the skin daily. Apply to painful area for up to 12 hours in a 24 hour period. (Patient taking differently: Apply 1 patch onto the skin daily as needed. Apply to painful area for up to 12 hours in a 24 hour period.) 30 patch 5  lisinopril 20 MG tablet Take 1 tablet (20 mg) by mouth every 12 hours. 180 tablet 1    naltrexone 50 MG tablet take 1 tablet by mouth once daily 30 tablet 1    Omega-3 Fatty Acids (OMEGA 3 OR) Take 1 tablet by mouth daily.      pregabalin 300 MG capsule Take 1 capsule (300 mg) by  mouth 2 times a day. 180 capsule 1    primidone 50 MG tablet Take 1 tablet (50 mg) by mouth 2 times a day. 180 tablet 4     No facility-administered medications prior to visit.        Allergies:  Review of patient's allergies indicates:  Allergies   Allergen Reactions    Adhesives Skin: Rash     Glue on lido patches    Bee Venom Skin: Hives, Skin: Itching and Swelling    Gabapentin Other     Flu like symptoms, diarrhea, body ache upset stomach    Methocarbamol Other     Patient's wife reports cognitive difficulties ("goofy")        Family/Social history:  family history includes Colon Cancer in his father; Heart (other) in his mother; Other Family Hx in an other family member. There is no history of Cancer.       OBJECTIVE:  Vitals:  BP 132/82   Pulse 73   Resp 16   SpO2 (!) 87%   Pulse: wiry, choppy, slippery   Tongue: Deferred. Patient wearing a face covering.    MSK: gait and station intact  ROM: Limited ROM on L shoulder at adduction       Assessment/Plan:    Following an evaluation including the history of present illness/s, medical history and examination, the patient's clinical presentation suggest probable:    (M54.50,  G89.29) Chronic bilateral low back pain without sciatica  (primary encounter diagnosis)      Diagnosis According to [place] Medicine:  Qi stagnation and Blood stasis in UB Channel     Treatment Principle/Goals:   Short-term: reduce muscle hypertonicity, decrease discomfort/pain, improve circulation and increase range of motion  Long-term: avoid the development of chronic pain syndrome and minimize probability of future exacerbations.    Time spent for face-to-face for evaluation and management was 29 minutes.   Total time spent for today's care, including review of previous medical records and creation of care plan: 45 minutes.       Prognosis / Future Plan / Recommendations:   I suggested Ronnie Mason to have a medical assessment for the sudden nose bleeding and low SpO2 level. We decided not to  have an acupucnture treatment.   Matin noted it is ok Augusto Gamble  Lac. to reach out his PCP Dr.Kraft and let Dr. Marisa Sprinkles know about today's visit.   If there is no urgent medical concern, we will resume the acupuncture treatment next week.       Augusto Gamble, AEMP, DACM, LAc  Licensed Acupuncturist, Paramedic for [employer]  Department of Family medicine  [place] [facility] [place]

## 2021-12-26 ENCOUNTER — Encounter (HOSPITAL_COMMUNITY): Payer: Self-pay

## 2021-12-26 NOTE — Telephone Encounter (Signed)
Called and LDVM regarding scheduling an appt for a neurosurgery referral.     Postponing 48hrs for second attempt or pt's response.

## 2021-12-30 NOTE — Telephone Encounter (Signed)
Pt confirmed appt.     Nothing further needed.     Closing TE.

## 2022-01-01 ENCOUNTER — Ambulatory Visit (INDEPENDENT_AMBULATORY_CARE_PROVIDER_SITE_OTHER): Payer: Medicare PPO | Admitting: Naturopathic

## 2022-01-01 DIAGNOSIS — M542 Cervicalgia: Secondary | ICD-10-CM

## 2022-01-01 DIAGNOSIS — M5459 Other low back pain: Secondary | ICD-10-CM

## 2022-01-01 DIAGNOSIS — M545 Low back pain, unspecified: Secondary | ICD-10-CM

## 2022-01-01 DIAGNOSIS — G8929 Other chronic pain: Secondary | ICD-10-CM

## 2022-01-01 NOTE — Progress Notes (Signed)
[facility] of [facility]    Acupuncture Treatment: 1    Referred by: Drexel Iha, MD     PCP: [name], MD    HPI: Ronnie Mason was referred to acupuncture and [place] medicine for consultation and treatment of    Chief Complaint   Patient presents with    Acupuncture     Bilateral low back pain without sciatica     Medication for pain management: no more ibuprofen, takes 6 Tylenols a day now.   No more nosebleed episode but also has not been evaluated since last time he was in office.     CC1: Neck Pain   - Shaking of left arm has not gotten worse  - Left arm and hand still weak   - Right side got really sore over the weekend (may have slept on it wrong) and couldn't turn his head; hard to drive   - Lasted about 3 days  - Numbness on the left shoulder is still present     CC2: Chronic Back Pain   - About the same   - Has not done much physical activity besides stretches in the morning and heat helps loosen it up   - Has not incapacitated him   - Still careful from seated to standing and getting out of the car   - Left side is always worse than the right but does fluctuates back and forth     Functional Limitations:  1) Arm/hand shaking on the left and only able to hold a can of soup.  2) Pain getting out of the car   3) Difficulty from seated to standing   4) Only able to walk 3-4 blocks in distance     Ronnie Mason has received acupuncture before and found it beneficial for low back pain in the past.     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    Allergic rhinitis due to allergen     Carotid Sinus Hypersensitivity     Disc disorder of lumbosacral region     Dizziness     Heart murmur     Hip injury     HYPERTENSION      HYPOTENSION      Nosebleed     Strep throat     Stroke  [facility])     Syncope     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(HCC)     URI (upper respiratory infection)         Surgical History:  Past Surgical History:   Procedure Laterality Date    L meniciscus Left 07/2017    by Dr Marcelle Overlie , re did left menisicus     PR ANES; COLONOSCOPY  2002    repeat in 3 years    PR ANES; COLONOSCOPY & POLYPECTOMY  11/18/2007    repeat in 3 years    PR HALLUX RIGIDUS W/CHEILECTOMY 1ST MP JT W/O IMPLT Right 10/21/2017    Dr. Donn Pierini    PR UNLISTED PROCEDURE FEMUR/KNEE      PR UNLISTED PROCEDURE HANDS/FINGERS      PR UNLISTED PROCEDURE SPINE  2013    rfa to c3 to c 7     TOE SURGERY Right 02/09/2019    toe surgery  Right  01/12/2019    Dr Hilarie Fredrickson         Medications:  Outpatient Medications Prior to Visit   Medication Sig Dispense Refill    Acetaminophen 500 MG Oral Tab Take 2 tablets (1,000 mg) by mouth 2 times a day.      Alpha-Lipoic Acid 300 MG tablet Take 1 tablet by mouth daily.      amLODIPine 5 MG tablet Take 1 tablet (5 mg) by mouth 2 times a day. 180 tablet 3    atorvastatin 10 MG tablet Take 1 tablet (10 mg) by mouth daily. 90 tablet 3    BENFOTIAMINE OR Take 250 mg by mouth daily.      betamethasone dipropionate 0.05 % lotion Apply topically 2 times a day. Apply to scalp and ears as needed for itching 60 mL 11    Cholecalciferol (VITAMIN D3) 2000 units Oral Cap Take 1 capsule (2,000 Units) by mouth daily. For low level 1 capsule 1    CLINDAMYCIN HCL OR Take by mouth.      Cyanocobalamin 1000 MCG Oral Tab one per day over-the-counter started 5/21 /2012 this level borderline low 1 Tab 1    diclofenac 1 % gel Apply 4 g topically 4 times a day. Apply to 2 gram four times daily to neck, trapezius muscle, and low back. Use a max of 32 grams a day. 300 g 4    DOXYCYCLINE OR Take by mouth.      DULoxetine 60 MG DR capsule Take 1 capsule (60 mg) by mouth 2 times a day. 180 capsule 2    EPINEPHrine 0.3 MG/0.3ML auto-injector INJECT INTRAMUSCULARLY ONCE AS NEEDED FOR SEVERE ALLERGIC REACTION 4 each 3     ibuprofen 200 MG tablet Take 1 tablet (200 mg) by mouth daily as needed (pain).      lidocaine 5 % patch Apply 1 patch onto the skin daily. Apply to painful area for up to 12 hours in a 24 hour period. (Patient taking differently: Apply 1 patch onto the skin daily as needed. Apply to painful area for up to 12 hours in a 24 hour period.) 30 patch 5    lisinopril 20 MG tablet Take 1 tablet (20 mg) by mouth every 12 hours. 180 tablet 1    naltrexone 50 MG tablet take 1 tablet by mouth once daily 30 tablet 1    Omega-3 Fatty Acids (OMEGA 3 OR) Take 1 tablet by mouth daily.      pregabalin 300 MG capsule Take 1 capsule (300 mg) by mouth 2 times a day. 180 capsule 1    primidone 50 MG tablet Take 1 tablet (50 mg) by mouth 2 times a day. 180 tablet 4     No facility-administered medications prior to visit.        Allergies:  Review of patient's allergies indicates:  Allergies   Allergen Reactions    Adhesives Skin: Rash     Glue on lido patches    Bee Venom Skin: Hives, Skin: Itching and Swelling    Gabapentin Other     Flu like symptoms, diarrhea, body ache upset stomach    Methocarbamol Other     Patient's wife reports cognitive difficulties ("goofy")        Family/Social history:  family history includes Colon Cancer in his father; Heart (other) in his mother; Other Family Hx in an other family member. There is no history of Cancer.       OBJECTIVE:  Vitals:  There  were no vitals taken for this visit.  Pulse: wiry, choppy, slippery   Tongue: Deferred. Patient wearing a face covering.    MSK: gait and station intact    Assessment/Plan:    Following an evaluation including the history of present illness/s, medical history and examination, the patient's clinical presentation suggest probable:    No diagnosis found.    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    First 15 Minutes: seated, face down   (BL) UB 10, SI 15, SI 14   (R) HTJJ C3-6  (L) 2 ashi on paracervicals   Needles in: 11  Needles out: 11    Second 15 Minutes: prone  (BL) UB 23, UB 25, Yao Yan, UB 28, UB 29   Needles in: 10   Needles out: 10     Patient tolerated this procedure well and without complications.     Time spent for face-to-face for acupuncture: 23 minutes     Total time spent for today's care, including review of previous medical records and creation of care plan: 45 minutes.     Encouraged patient to be evaluated if another nosebleed episode or low oxygenation occurs again as he has not done so even after the last acupuncturist recommended him to get those checked out.             Wu-Hsun Hermenia Bersom Ether Wolters, ND, LAc  Licensed Acupuncturist, Teaching Associate  [facility]  [facility]  [facility]

## 2022-01-02 ENCOUNTER — Ambulatory Visit (INDEPENDENT_AMBULATORY_CARE_PROVIDER_SITE_OTHER): Payer: Medicare PPO | Admitting: Podiatrist

## 2022-01-02 DIAGNOSIS — M205X1 Other deformities of toe(s) (acquired), right foot: Secondary | ICD-10-CM

## 2022-01-02 DIAGNOSIS — M722 Plantar fascial fibromatosis: Secondary | ICD-10-CM

## 2022-01-02 DIAGNOSIS — L905 Scar conditions and fibrosis of skin: Secondary | ICD-10-CM

## 2022-01-02 DIAGNOSIS — M7671 Peroneal tendinitis, right leg: Secondary | ICD-10-CM

## 2022-01-02 MED ORDER — TRIAMCINOLONE ACETONIDE 40 MG/ML IJ SUSP
20.0000 mg | Freq: Once | INTRAMUSCULAR | Status: AC
Start: 2022-01-02 — End: 2022-01-02
  Administered 2022-01-02: 20 mg via INTRALESIONAL

## 2022-01-02 NOTE — Progress Notes (Signed)
Patient: Ronnie Mason   Patient DOB: [birth year]     DOS:  01/02/2022     Accompanied by:  spouse    Subjective:  (history is obtained by MA/RN and edited by [name])  Ronnie Mason is a 71 year old year old male who presents today for re-evaluation of right foot.    The patient states doing ok but having issues also thru arch   The current condition has existed for long time.    Patient describes the symptoms as pain all the time.     The level the pain/severity of pain is moderate.   The condition is worse when walking.     Current treatments/recent studies include customize Quickstride insert, shoes, strap.     The response to the treatment is not working.     Patient's activity limitation is limited in walking.     Goals: evaluate current condition    PCP:  [name], MD     PMH:   Past Medical History:   Diagnosis Date    Pain of right hip joint 01/23/2016    Intraparenchymal hemorrhage of brain (HCC) 2017    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 myelopathy or radiculopathy 05/28/2014    Allergic rhinitis due to allergen     Carotid Sinus Hypersensitivity     Disc disorder of lumbosacral region     Dizziness     Heart murmur     Hip injury     HYPERTENSION      HYPOTENSION      Nosebleed     Strep throat     Stroke [facility])     Syncope     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(HCC)     URI (upper respiratory infection)         Review of patient's allergies indicates:  Allergies   Allergen Reactions    Adhesives Skin: Rash     Glue on lido patches    Bee Venom Skin: Hives, Skin: Itching and Swelling    Gabapentin Other     Flu like symptoms, diarrhea, body ache upset stomach    Methocarbamol Other     Patient's wife reports cognitive difficulties ("goofy")        Medications:   Outpatient Medications Prior to Visit   Medication Sig Dispense Refill    Acetaminophen 500 MG Oral Tab Take 2 tablets (1,000 mg) by mouth 2 times a day.      Alpha-Lipoic Acid 300 MG tablet Take 1 tablet by mouth daily.      amLODIPine 5  MG tablet Take 1 tablet (5 mg) by mouth 2 times a day. 180 tablet 3    atorvastatin 10 MG tablet Take 1 tablet (10 mg) by mouth daily. 90 tablet 3    BENFOTIAMINE OR Take 250 mg by mouth daily.      betamethasone dipropionate 0.05 % lotion Apply topically 2 times a day. Apply to scalp and ears as needed for itching 60 mL 11    Cholecalciferol (VITAMIN D3) 2000 units Oral Cap Take 1 capsule (2,000 Units) by mouth daily. For low level 1 capsule 1    CLINDAMYCIN HCL OR Take by mouth.      Cyanocobalamin 1000 MCG Oral Tab one per day over-the-counter started 5/21 /2012 this level borderline low 1 Tab 1    diclofenac 1 % gel Apply 4 g topically 4 times a day. Apply to 2 gram four times daily to neck, trapezius muscle, and  low back. Use a max of 32 grams a day. 300 g 4    DOXYCYCLINE OR Take by mouth.      DULoxetine 60 MG DR capsule Take 1 capsule (60 mg) by mouth 2 times a day. 180 capsule 2    EPINEPHrine 0.3 MG/0.3ML auto-injector INJECT INTRAMUSCULARLY ONCE AS NEEDED FOR SEVERE ALLERGIC REACTION 4 each 3    ibuprofen 200 MG tablet Take 1 tablet (200 mg) by mouth daily as needed (pain).      lidocaine 5 % patch Apply 1 patch onto the skin daily. Apply to painful area for up to 12 hours in a 24 hour period. (Patient taking differently: Apply 1 patch onto the skin daily as needed. Apply to painful area for up to 12 hours in a 24 hour period.) 30 patch 5    lisinopril 20 MG tablet Take 1 tablet (20 mg) by mouth every 12 hours. 180 tablet 1    naltrexone 50 MG tablet take 1 tablet by mouth once daily 30 tablet 1    Omega-3 Fatty Acids (OMEGA 3 OR) Take 1 tablet by mouth daily.      pregabalin 300 MG capsule Take 1 capsule (300 mg) by mouth 2 times a day. 180 capsule 1    primidone 50 MG tablet Take 1 tablet (50 mg) by mouth 2 times a day. 180 tablet 4     No facility-administered medications prior to visit.        PSH:   Past Surgical History:   Procedure Laterality Date    L meniciscus Left 07/2017    by Dr Marcelle Overlie , re  did left menisicus     PR ANES; COLONOSCOPY  2002    repeat in 3 years    PR ANES; COLONOSCOPY & POLYPECTOMY  11/18/2007    repeat in 3 years    PR HALLUX RIGIDUS W/CHEILECTOMY 1ST MP JT W/O IMPLT Right 10/21/2017    Dr. Donn Pierini    PR UNLISTED PROCEDURE FEMUR/KNEE      PR UNLISTED PROCEDURE HANDS/FINGERS      PR UNLISTED PROCEDURE SPINE  2013    rfa to c3 to c 7     TOE SURGERY Right 02/09/2019    toe surgery  Right 01/12/2019    Dr Hilarie Fredrickson         Family History:  family history includes Colon Cancer in his father; Heart (other) in his mother; Other Family Hx in an other family member. There is no history of Cancer.    Social History:  Social History     Socioeconomic History    Marital status: Married     Spouse name: Not on file    Number of children: Not on file    Years of education: Not on file    Highest education level: Not on file   Occupational History    Not on file   Tobacco Use    Smoking status: Never    Smokeless tobacco: Current     Types: Chew    Tobacco comments:     Sometimes chews, but is trying to quit. 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  Non-smoker, 14 or more drinks/week almost exclusively wine with dinner.            Social Determinants of Health     Financial Resource Strain: Not on file   Food Insecurity: Not on file   Transportation Needs: Not on file   Physical Activity: Not on file   Stress: Not on file   Social Connections: Not on file   Intimate Partner Violence: Not on file   Housing Stability: Not on file        Physical Exam:    There were no vitals filed for this visit.     General: The patient is 71 year old year old male White  with constitutional symptom: appears age stated.    Psychiatric/mood: normal mood and affect.    Neurological: alert and oriented x3.    Vascular:   RIGHT:  Dorsalis pedis pulse is palpable.  Posterior tibial pulse is palpable. Capillary filling time note to be immediate on the distal hallux.      Neurologic:  RIGHT:  The sensation is intact on the foot and ankle.   Motor coordination is intact with normal muscle tone.  There is no manifestations of pathological reflexes noted.      Dermatological:   RIGHT:  The skin surgical scar(s) along the plantar arch from I & D is healed without hyperkeratosis.        Musculoskeletal:   RIGHT:  There is approximately 2 cm soft tissue nodule along the plantar fascia at midarch which there is moderate pain on palpation.   The plantar fascia is normal with Hubscher maneuver.  There is rigid mallet toe deformity on 2nd toe and 3rd toe.  There is moderate pain at dorsal DIPJ.  There is abrasion on DIPJ noted 2nd toe.  There is no contracture of the 2nd and 3rd MTPJ.  The 2nd/3rd MTPJ is stable.  The modify Lachman's test is negative.    There is moderate pain on palpation of the peroneus longus tendon on the lateral cuboid region.  There is no swelling along the tendon.  There is no weakness and defect/gap.        OVER COUNT INSERT EVALUATION:  Quickstride revealed has gapping on the arch.  The OTC insert fit well in the shoes.       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  We discussed following treatment option i.e. continue current care, steroid injection therapy, orthotics - functional and  accommodative, and surgical excision.  Answered all of patient's questions.  After the discussion, the patient elected steroid injection therapy.       INJECTION:  Plantar fibroma intra lesional injection performed today. Prepped arch with alcohol prep.  Sprayed area with Ethyl Chloride at initial injection.  A mixture of 2% Lidocaine plain with Kenalog(10mg ) was administered intralesionally to reduce symptoms.  The patient was cautioned regarding hypopigmentation, fat atrophy and steroid flare.       Detailed  education and discussion took place with the patient regards to mallet toe deformity of 2nd toe and 3rd toe, right.  Discussion included spectrum of treatments available such as continue with current care, deep toe box/stable shoe, silicone tube and surgical correction.   After the discussion, today patient elected silicone tube and deep toe box/stable shoe.        Detailed education and discussion took place with the patient regards to peroneus longus tendinitis, right.  We discuss possible etiology such as unknown.  Following treatment options were recommended - continue with current care orthotic.  The patient elected to try -  continue with current care.       Return appointment: follow up in 3-4 weeks if symptom worsen or fail to improve    TIME SPENT:  I spent a total of 30 minutes for the patient's care on the date of the service, and it is separate from time spent performing following procedure:  injection to fibroma.      Randa Spikeony D.H. Sharaya Boruff DPM, FACFAS  Reconstructive Ankle and Foot Surgeon  [facility]

## 2022-01-02 NOTE — Patient Instructions (Signed)
Choose appropriate shoes/sandal/insert as marked by provider.

## 2022-01-02 NOTE — Progress Notes (Signed)
Accompanied by:  wife    Ronnie Mason is a 71 year old year old male who presents today for re-evaluation of right foot.    The patient states doing ok but having issues also thru arch   The current condition has existed for long time.    Patient describes the symptoms as pain all the time.     The level the pain/severity of pain is moderate.   The condition is worse when walking.     Current treatments/recent studies include customize Quickstride, shoes, strap.     The response to the treatment is not working.     Patient's activity limitation is limited in walking.

## 2022-01-06 MED ORDER — BETAMETHASONE SOD PHOS & ACET 6 (3-3) MG/ML IJ SUSP
12.0000 mg | Freq: Once | INTRAMUSCULAR | Status: AC
Start: 2022-01-09 — End: 2022-01-09
  Administered 2022-01-09: 12 mg

## 2022-01-06 MED ORDER — IOHEXOL 240 MG/ML IJ SOLN
2.0000 mL | Freq: Once | INTRAMUSCULAR | Status: AC
Start: 2022-01-09 — End: 2022-01-09
  Administered 2022-01-09: 2 mL via EPIDURAL

## 2022-01-06 NOTE — Progress Notes (Signed)
PROCEDURE NOTE   CERVICOTHORACIC INTERLAMINAR EPIDURAL INJECTION      PROCEDURE  Interlaminar cervical epidural steroid injection, level C6-C7    PRE-PROCEDURE DIAGNOSIS  (M54.12) Cervical radiculopathy, chronic  (primary encounter diagnosis).    POST-PROCEDURE DIAGNOSIS  (M54.12) Cervical radiculopathy, chronic  (primary encounter diagnosis).    INDICATION  Ronnie Mason has clinical and imaging findings consistent with possible cervical radiculopathy and has failed efforts at conservative care.      ATTENDING PHYSICIAN:  [name], MD    FELLOW / RESIDENT  None    SEDATION  No    NPO STATUS  No    INTRAVENOUS LINE  Yes    OPERATIVE PROCEDURE  Final verification was performed prior to beginning the procedure.  Ronnie Mason was brought to the procedure room and placed on the exam table in a prone position, with the head flexed. The sterile field was prepped in a sterile fashion with Chloroprep.  Sterile drapes applied. The C7-T1 interspace was visualized by anteroposterior fluoroscopic image.   Local anesthesia was provided by infiltration of 1% lidocaine x 3 mL. An 18-gauge 3.5 inch Touhy needle was placed below the superior laminar edge. Using fluoroscopic guidance, the needle was advanced into the intralaminar window. Repeated multiplanar images were done to prevent excessively deep insertion of the needle. The loss of resistance to saline technique was used as the initial method of proper needle placement. Attempted aspiration yielded no blood or cerebrospinal fluid. Placement was confirmed with hanging column technique with saline. Omnipaque 240 x 1 mL was injected. Fluoroscopy showed the typical pattern of the epidural space and no vascular uptake. Contralateral oblique view confirmed proper epidural spread.    Injectate:  Betamethasone 6 mg/ml 2 ml and saline 2 ml were injected into the epidural space. The needle and catheter were then withdrawn uneventfully.    Fluoroscopic guidance was used for this  procedure.  Appropriate images were saved.      POST-PROCEDURE  Ronnie Mason  was transported to the recovery room for observation where he made an uneventful recovery. .    COMPLICATIONS  None.     PLAN  Pain diary.  Next steps in his care depend on the response to today's procedure.  Follow up with Dr. Araceli Bouche.

## 2022-01-07 ENCOUNTER — Encounter (INDEPENDENT_AMBULATORY_CARE_PROVIDER_SITE_OTHER): Payer: Self-pay | Admitting: Internal Medicine

## 2022-01-07 ENCOUNTER — Ambulatory Visit (INDEPENDENT_AMBULATORY_CARE_PROVIDER_SITE_OTHER): Payer: Medicare PPO | Admitting: Internal Medicine

## 2022-01-07 VITALS — BP 112/72 | HR 83 | Temp 98.0°F | Resp 16 | Wt 230.2 lb

## 2022-01-07 DIAGNOSIS — R419 Unspecified symptoms and signs involving cognitive functions and awareness: Secondary | ICD-10-CM

## 2022-01-07 DIAGNOSIS — R040 Epistaxis: Secondary | ICD-10-CM

## 2022-01-07 DIAGNOSIS — I10 Essential (primary) hypertension: Secondary | ICD-10-CM

## 2022-01-07 DIAGNOSIS — F1090 Alcohol use, unspecified, uncomplicated: Secondary | ICD-10-CM

## 2022-01-07 DIAGNOSIS — M792 Neuralgia and neuritis, unspecified: Secondary | ICD-10-CM

## 2022-01-07 DIAGNOSIS — G9389 Other specified disorders of brain: Secondary | ICD-10-CM

## 2022-01-07 DIAGNOSIS — G8929 Other chronic pain: Secondary | ICD-10-CM

## 2022-01-07 DIAGNOSIS — E785 Hyperlipidemia, unspecified: Secondary | ICD-10-CM

## 2022-01-07 DIAGNOSIS — Z8679 Personal history of other diseases of the circulatory system: Secondary | ICD-10-CM

## 2022-01-07 DIAGNOSIS — M549 Dorsalgia, unspecified: Secondary | ICD-10-CM

## 2022-01-07 DIAGNOSIS — M542 Cervicalgia: Secondary | ICD-10-CM

## 2022-01-07 DIAGNOSIS — G894 Chronic pain syndrome: Secondary | ICD-10-CM

## 2022-01-07 DIAGNOSIS — G250 Essential tremor: Secondary | ICD-10-CM

## 2022-01-07 MED ORDER — AMLODIPINE BESYLATE 5 MG OR TABS
5.0000 mg | ORAL_TABLET | Freq: Two times a day (BID) | ORAL | 3 refills | Status: DC
Start: 2022-01-07 — End: 2022-12-22

## 2022-01-07 MED ORDER — PREGABALIN 300 MG OR CAPS
300.0000 mg | ORAL_CAPSULE | Freq: Two times a day (BID) | ORAL | 1 refills | Status: DC
Start: 2022-01-07 — End: 2022-08-25

## 2022-01-07 MED ORDER — DULOXETINE HCL 60 MG OR CPEP
60.0000 mg | DELAYED_RELEASE_CAPSULE | Freq: Two times a day (BID) | ORAL | 2 refills | Status: DC
Start: 2022-01-07 — End: 2023-02-26

## 2022-01-07 MED ORDER — PRIMIDONE 50 MG OR TABS
50.0000 mg | ORAL_TABLET | Freq: Two times a day (BID) | ORAL | 4 refills | Status: DC
Start: 2022-01-07 — End: 2023-03-10

## 2022-01-07 MED ORDER — ATORVASTATIN CALCIUM 10 MG OR TABS
10.0000 mg | ORAL_TABLET | Freq: Every day | ORAL | 3 refills | Status: DC
Start: 2022-01-07 — End: 2023-03-23

## 2022-01-07 NOTE — Progress Notes (Signed)
Ronnie Mason is a 71 year old male       BP 112/72   Pulse 83   Temp 36.7 C (Temporal)   Resp 16   Wt (!) 104.4 kg (230 lb 3.2 oz)   SpO2 95%   BMI 28.02 kg/m   Chief Complaint   Patient presents with    Referral     Referral to neurosurgery, Dr. Betsey Holiday at [facility] or another provider at that clinic       Patient seen with   Wife    HPI  History of:   pcp [name], MD        Patient Care Team:  [name], MD as PCP - General (Internal Medicine)  Ward Chatters, MD as Cardiologist  (Cardiology)  Geri Seminole, MD (Sports Medicine)  Clydene Pugh, MD (Gastroenterology)  Casandra Doffing, MD (General Surgery)  Reggie Pile, ARNP (Urology)  Hilarie Fredrickson Dong-Hyun, DPM (Podiatry)  Renea Ee, MD as Primary Contact (Pain Management)  Doreene Nest, MD as Surgeon (Otolaryngology)      Today follow-up above issues:      Patient is in today to request a referral to Dr. Fransisca Connors who has seen him in the past for ventriculomegaly  Patient has a history of bleed in his brain    His wife has noted some progression of his cognition which he does not agree with    Patient also reports that he is being followed closely by the pain clinic pending injections trigger point injections in his low back pending epidural neck injections for the pain down his left arm    Also followed by [facility] for alcohol consumption on medication        Office Visit on 07/23/21   1. Chronic Pain Drug Risk 1 w/Cnslt, Urine   Result Value Ref Range    Expected Drugs, URN SEE NOTES     Drug Screen Interp Comment, URN (NOTE)     Cocaine (Qual), URN Negative NRN    Opiates (Qual), URN Negative NRN    Amphet/Methamphetamine Qual,URN Negative NRN    Cannabinoids (Qual), URN Negative NRN    Barbiturate (Qual), URN Positive (A) NRN    Benzodiazepines (Qual), URN Negative NRN    Methadone (Qual), URN Negative NRN    Oxycodone (Qual), URN Negative NRN    Buprenorphine  Qualitatiave, URN Negative NRN    Tramadol Qual, URN Negative NRN    Fentanyl Qual, URN Negative NRN    Drug Screen Test Info, URN SEE NOTES      *Note: Due to a large number of results and/or encounters for the requested time period, some results have not been displayed. A complete set of results can be found in Results Review.       No results found.    MEDICATION PRIOR TO VISIT  Reports   Outpatient Medications Prior to Visit   Medication Sig Dispense Refill    Acetaminophen 500 MG Oral Tab Take 2 tablets (1,000 mg) by mouth 2 times a day.      Alpha-Lipoic Acid 300 MG tablet Take 1 tablet by mouth daily.      amLODIPine 5 MG tablet Take 1 tablet (5 mg) by mouth 2 times a day. 180 tablet 3    atorvastatin 10 MG tablet Take 1 tablet (10 mg) by mouth daily. 90 tablet 3    BENFOTIAMINE OR Take 250 mg by mouth daily.  betamethasone dipropionate 0.05 % lotion Apply topically 2 times a day. Apply to scalp and ears as needed for itching 60 mL 11    Cholecalciferol (VITAMIN D3) 2000 units Oral Cap Take 1 capsule (2,000 Units) by mouth daily. For low level 1 capsule 1    CLINDAMYCIN HCL OR Take by mouth.      Cyanocobalamin 1000 MCG Oral Tab one per day over-the-counter started 5/21 /2012 this level borderline low 1 Tab 1    diclofenac 1 % gel Apply 4 g topically 4 times a day. Apply to 2 gram four times daily to neck, trapezius muscle, and low back. Use a max of 32 grams a day. 300 g 4    DOXYCYCLINE OR Take by mouth.      DULoxetine 60 MG DR capsule Take 1 capsule (60 mg) by mouth 2 times a day. 180 capsule 2    EPINEPHrine 0.3 MG/0.3ML auto-injector INJECT INTRAMUSCULARLY ONCE AS NEEDED FOR SEVERE ALLERGIC REACTION 4 each 3    ibuprofen 200 MG tablet Take 1 tablet (200 mg) by mouth daily as needed (pain).      lidocaine 5 % patch Apply 1 patch onto the skin daily. Apply to painful area for up to 12 hours in a 24 hour period. (Patient taking differently: Apply 1 patch onto the skin daily as needed. Apply to painful  area for up to 12 hours in a 24 hour period.) 30 patch 5    lisinopril 20 MG tablet Take 1 tablet (20 mg) by mouth every 12 hours. 180 tablet 1    naltrexone 50 MG tablet take 1 tablet by mouth once daily 30 tablet 1    Omega-3 Fatty Acids (OMEGA 3 OR) Take 1 tablet by mouth daily.      pregabalin 300 MG capsule Take 1 capsule (300 mg) by mouth 2 times a day. 180 capsule 1    primidone 50 MG tablet Take 1 tablet (50 mg) by mouth 2 times a day. 180 tablet 4     No facility-administered medications prior to visit.             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    Diagnoses and all orders for this visit:    Cerebral ventriculomegaly  -     Referral to Neurosurgery; Future    Chronic alcohol use    Bleeding from the nose    Chronic back pain, unspecified back location, unspecified back pain laterality    Hyperlipidemia, unspecified hyperlipidemia  type  -     Lipid Panel; Future  -     Comprehensive Metabolic Panel; Future  -     atorvastatin 10 MG tablet; Take 1 tablet (10 mg) by mouth daily.    Primary hypertension  -     CBC with Diff; Future    Neuropathic pain  -     pregabalin 300 MG  capsule; Take 1 capsule (300 mg) by mouth 2 times a day.    Essential tremor  -     primidone 50 MG tablet; Take 1 tablet (50 mg) by mouth 2 times a day.    Essential hypertension  -     amLODIPine 5 MG tablet; Take 1 tablet (5 mg) by mouth 2 times a day.    H/O cerebral parenchymal hemorrhage  -     amLODIPine 5 MG tablet; Take 1 tablet (5 mg) by mouth 2 times a day.    Cervicalgia  -     DULoxetine 60 MG DR capsule; Take 1 capsule (60 mg) by mouth 2 times a day.    Chronic pain syndrome  -     DULoxetine 60 MG DR capsule; Take 1 capsule (60 mg) by mouth 2 times a day.      H/o of not making good choices and wife is concerned progressing    Patient is requesting a referral back to Dr. Fransisca ConnorsMichael Williams to really assess his brain function as there has been some concern with him forgetting I am not sure how much of this is further progression of his baseline I have a feeling that is what it is or if there is something new his wife has noted changes for which husband does not agree    I do think that he should stop alcohol altogether for which he does not agree    He also has been having recurrent nosebleeds he really should go back to ENT and I do not think this has been addressed by ENT in the past    Chronic back pain neck pain shoulder pain are being addressed by the pain clinic    He was I do think that he does not make good choices in general     Chase down shingrix 08/20/21 [address] [place]   First one 05/27/2020 [facility]       Continue chronic pain med lyrica and follow up pain clnic     Check labs on statin   Continue med for tremor     See ent for nose bleed     Discussed hm due along with other issues above   Health Maintenance Topics with due status: Overdue       Topic Date Due    Zoster Vaccine wife thinks had both  07/22/2020     Health Maintenance Topics with due status: Due Soon       Topic Date Due    Lipid Disorders Screening 01/15/2022    Depression Screening (PHQ-2) 02/05/2022          No follow-ups on file.    Patient   express understanding of care plan/medications  Risk of taking medication properly and follow up discussed especially to call if problem    Created with the assistance of voice recognition software dictation      I spent a total of 49 minutes for the patient's care on the date of the service.

## 2022-01-08 ENCOUNTER — Ambulatory Visit (INDEPENDENT_AMBULATORY_CARE_PROVIDER_SITE_OTHER): Payer: Medicare PPO | Admitting: Naturopathic

## 2022-01-08 DIAGNOSIS — M542 Cervicalgia: Secondary | ICD-10-CM

## 2022-01-08 DIAGNOSIS — G8929 Other chronic pain: Secondary | ICD-10-CM

## 2022-01-08 DIAGNOSIS — M5459 Other low back pain: Secondary | ICD-10-CM

## 2022-01-08 NOTE — Progress Notes (Signed)
[place] [facility]    Acupuncture Treatment: 2    Referred by: William Dalton, MD     PCP: Donnal Moat, MD    HPI: Ronnie Mason was referred to acupuncture and [place] for consultation and treatment of    Chief Complaint   Patient presents with    Acupuncture     Bilateral low back pain without sciatica, cervicalgia       CC1: Neck Pain   - Felt great for about 2 days after the last treatment   - Some of the tightness went away after treatment; tightness worse in the morning and better with stretching and working outside, usually grabbing sensation starts in the afternoon   - No change to weakness or strength of the left arm   - More on the left still and just a bit on the right     CC2: Chronic Back Pain   - Left side of the low back is the worse   - Did not notice much improvement in the low back   - 3-4 time a month it would grab him and makes him drop him to the ground, usually when doing physical labors     Functional Limitations:  1) Arm/hand shaking on the left and only able to hold a can of soup.  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    Allergic rhinitis due to allergen     Carotid Sinus Hypersensitivity     Disc disorder of lumbosacral region     Dizziness     Heart murmur     Hip injury     HYPERTENSION      HYPOTENSION      Nosebleed     Strep throat     Stroke [facility])     Syncope     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([place])     URI (upper respiratory infection)         Surgical History:  Past Surgical History:   Procedure Laterality Date    L  meniciscus Left 07/2017    by Dr Matthew Saras , re did left menisicus     PR ANES; COLONOSCOPY  2002    repeat in 3 years    PR ANES; COLONOSCOPY & POLYPECTOMY  11/18/2007    repeat in 3 years    PR HALLUX RIGIDUS W/CHEILECTOMY 1ST MP JT W/O IMPLT Right 10/21/2017    Dr. Megan Salon    PR UNLISTED PROCEDURE FEMUR/KNEE      PR UNLISTED PROCEDURE HANDS/FINGERS      PR UNLISTED PROCEDURE SPINE  2013    rfa to c3 to c 7     TOE SURGERY Right 02/09/2019    toe surgery  Right 01/12/2019    Dr Wilmon Arms         Medications:  Outpatient Medications Prior to Visit   Medication Sig Dispense Refill    Acetaminophen 500 MG Oral Tab Take  2 tablets (1,000 mg) by mouth 2 times a day.      Alpha-Lipoic Acid 300 MG tablet Take 1 tablet by mouth daily.      amLODIPine 5 MG tablet Take 1 tablet (5 mg) by mouth 2 times a day. 180 tablet 3    atorvastatin 10 MG tablet Take 1 tablet (10 mg) by mouth daily. 90 tablet 3    BENFOTIAMINE OR Take 250 mg by mouth daily.      betamethasone dipropionate 0.05 % lotion Apply topically 2 times a day. Apply to scalp and ears as needed for itching 60 mL 11    Cholecalciferol (VITAMIN D3) 2000 units Oral Cap Take 1 capsule (2,000 Units) by mouth daily. For low level 1 capsule 1    CLINDAMYCIN HCL OR Take by mouth.      Cyanocobalamin 1000 MCG Oral Tab one per day over-the-counter started 5/21 /2012 this level borderline low 1 Tab 1    diclofenac 1 % gel Apply 4 g topically 4 times a day. Apply to 2 gram four times daily to neck, trapezius muscle, and low back. Use a max of 32 grams a day. 300 g 4    DOXYCYCLINE OR Take by mouth.      DULoxetine 60 MG DR capsule Take 1 capsule (60 mg) by mouth 2 times a day. 180 capsule 2    EPINEPHrine 0.3 MG/0.3ML auto-injector INJECT INTRAMUSCULARLY ONCE AS NEEDED FOR SEVERE ALLERGIC REACTION 4 each 3    ibuprofen 200 MG tablet Take 1 tablet (200 mg) by mouth daily as needed (pain).      lidocaine 5 % patch Apply 1 patch onto the skin daily. Apply to painful area for up  to 12 hours in a 24 hour period. (Patient taking differently: Apply 1 patch onto the skin daily as needed. Apply to painful area for up to 12 hours in a 24 hour period.) 30 patch 5    lisinopril 20 MG tablet Take 1 tablet (20 mg) by mouth every 12 hours. 180 tablet 1    naltrexone 50 MG tablet take 1 tablet by mouth once daily 30 tablet 1    Omega-3 Fatty Acids (OMEGA 3 OR) Take 1 tablet by mouth daily.      pregabalin 300 MG capsule Take 1 capsule (300 mg) by mouth 2 times a day. 180 capsule 1    primidone 50 MG tablet Take 1 tablet (50 mg) by mouth 2 times a day. 180 tablet 4     No facility-administered medications prior to visit.        Allergies:  Review of patient's allergies indicates:  Allergies   Allergen Reactions    Adhesives Skin: Rash     Glue on lido patches    Bee Venom Skin: Hives, Skin: Itching and Swelling    Gabapentin Other     Flu like symptoms, diarrhea, body ache upset stomach    Methocarbamol Other     Patient's wife reports cognitive difficulties ("goofy")        Family/Social history:  family history includes Colon Cancer in his father; Heart (other) in his mother; Other Family Hx in an other family member. There is no history of Cancer.       OBJECTIVE:  Vitals:  There were no vitals taken for this visit.  Pulse: wiry, choppy, slippery   Tongue: Deferred. Patient wearing a face covering.    MSK: gait and station intact    Assessment/Plan:    Following  an evaluation including the history of present illness/s, medical history and examination, the patient's clinical presentation suggest probable:    (M54.50,  G89.29) Chronic bilateral low back pain without sciatica  (primary encounter diagnosis)    (M54.2) Cervicalgia      Diagnosis According to [place]:  Qi stagnation and Blood stasis in UB Channel     Treatment Principle/Goals:   Short-term: reduce muscle hypertonicity, decrease discomfort/pain, improve circulation and increase range of motion  Long-term: avoid the development of  chronic pain syndrome and minimize probability of future exacerbations.    First 15 Minutes: prone   (BL) UB 10, Bai Lao, SI 14, GB 21  (L) HT JJ 5-6, 1.5 cun lateral, SI 11, SI 10   Needles in: 12  Needles out: 12    Second 15 Minutes: prone  (BL) Lumbosacral junction ashi, Genia Plants, UB 23, UB 28, UB 29   (R) LI 15   Needles in: 11  Needles out: 11     Patient tolerated this procedure well and without complications.     Time spent for face-to-face for acupuncture: 23 minutes     Total time spent for today's care, including review of previous medical records and creation of care plan: 45 minutes.               Wu-Hsun Collier Bullock, ND, LAc  Licensed Acupuncturist, Teaching Associate  [facility] [place] [facility] [place] [facility] [place]

## 2022-01-09 ENCOUNTER — Other Ambulatory Visit (HOSPITAL_BASED_OUTPATIENT_CLINIC_OR_DEPARTMENT_OTHER): Payer: Self-pay | Admitting: Psychiatry

## 2022-01-09 ENCOUNTER — Ambulatory Visit: Payer: Medicare PPO | Attending: Anesthesiology | Admitting: Anesthesiology

## 2022-01-09 VITALS — BP 124/77 | HR 86 | Temp 96.7°F | Resp 14

## 2022-01-09 DIAGNOSIS — F1020 Alcohol dependence, uncomplicated: Secondary | ICD-10-CM

## 2022-01-09 DIAGNOSIS — M5412 Radiculopathy, cervical region: Secondary | ICD-10-CM | POA: Insufficient documentation

## 2022-01-09 MED ORDER — BETAMETHASONE SOD PHOS & ACET 6 (3-3) MG/ML IJ SUSP
INTRAMUSCULAR | Status: AC
Start: 2022-01-09 — End: 2022-01-09
  Filled 2022-01-09: qty 5

## 2022-01-09 MED ORDER — NALTREXONE HCL 50 MG OR TABS
ORAL_TABLET | ORAL | 1 refills | Status: DC
Start: 2022-01-09 — End: 2022-03-16

## 2022-01-09 NOTE — Progress Notes (Signed)
Pain score prior to procedure:  4-5/10  Pain score after procedure:  <4-5/10    Ronnie Mason met discharge criteria:  A/OX4/baseline, VSS/returned to baseline, ambulatory with steady gait. Site clean dry and intact.  Written and verbal discharge instructions, including emergency contact phone numbers, reviewed with Ronnie Mason. Verbal understanding obtained from him.  PIV removed, catheter intact, no redness or swelling at site.   Ronnie Mason dressed self without assistance. He was discharged in stable condition, ambulatory with steady gait to home with all belongings and with ride/responsible person.

## 2022-01-09 NOTE — Patient Instructions (Signed)
[facility]  Post-Procedure Discharge Instructions      After the procedure:  Immediate and complete pain relief is rare  Numbness and/or weakness in the area of your body supplied by the injected nerve; these symptoms should resolve but may last up to several hours  Some soreness and bruising at the injection site(s)    Activities:  If you have any weakness or numbness caused by the injection, DO NOT DRIVE or operate machinery and limit other activity until sensation returns to normal.  You may resume regular exercises/activities as tolerated.  If you received sedation, DO NOT DRIVE or operate machinery for 24 hours.    Medications:  If you stopped taking any blood thinning medications such as Coumadin or Plavix, you may resume these tomorrow unless specified differently by the prescribing physician.    Site care:  You may remove the band-aid after 6 hours.  You may shower today. No swimming, tub baths or hot tubs for 24 hours following your procedure.  For the first 48 hours, apply ice packs to the injection site for 15-20 minutes hourly as needed for comfort.  Wrap a light towel or cloth around ice packs and heating pad to protect the skin.  After 48 hours, use a warm heating pad to the injection site for 15-20 minutes hourly as needed for comfort.    If you received steroids today:  Steroid medications may cause facial flushing, occasional low-grade fevers, hiccups, insomnia, headaches, water retention, increased appetite, increased heart rate, and abdominal cramping or bloating. These side effects occur in only about 5 percent of patients and commonly disappear within one to three days after the injection.  If you are diabetic check your blood sugar more frequently than usual as you may develop an increase in blood sugar for the next 10-14 days. Contact your diabetes physician if this occurs.    Call us if you develop any of the following symptoms in the next 7 days:  Fever above 100 degrees  F     Any unusual increase in your level of pain  Swelling, bleeding, redness, or increased tenderness at the procedure or IV site  Headache not relieved by Tylenol (if you had an epidural steroid injection)  .    Contact us:  Anytime, 24 hours/day, 7 days/week at [phone]      [facility]  Patient Self-Administered Pain Diary  1 month    02/08/2022      The procedure you just had was done in hopes of providing long term pain relief. Depending on the type of procedure you had, you will need to answer the questions below 1 month after your procedure.  Accurate completion and timely reporting of your pain diary(s) enables your Pain MD to review the results and make further treatment recommendations.    1 month after your procedure, write your answers to the 4 questions below and send your pain score diary to us either by:      1)  Sending a picture via eCare    2)  Faxing to [phone]  3)  Calling the pain score voicemail line at [phone].  Leave the spelling of your first and last name, U#, date of birth and date of procedure and the answers to all of the 4 questions below.             Use this scale to rate your pain  0 1 2 3 4 5 6 7 8 9   10  No pain                      worst pain        Procedure Date: 01/09/22    1.  Pain score immediately before procedure: __________    2. 1 month after your procedure, do you feel better?  YES/NO    3.  If you answered YES to the above, by what percentage__________% (0-100%) has your pain been relieved.    4.  Pain Score now: _________ (at time of telephone call)              [facility]  Patient Self-Administered Pain Diary  3 months    04/11/2022      The procedure you just had was done in hopes of providing long term pain relief. Depending on the type of procedure you had, you will need to answer the questions below 3 months after your procedure.  Accurate completion and timely reporting of your pain diary(s) enables your Pain MD to review the  results and make further treatment recommendations.    3 months after your procedure, write your answers to the 4 questions below and send your pain score diary to us either by:      1)  Sending a picture via eCare    2)  Faxing to [phone]  3)  Calling the pain score voicemail line at [phone].  Leave the spelling of your first and last name, U#, date of birth and date of procedure and the answers to all of the 4 questions below.             Use this scale to rate your pain  0 1 2 3 4 5 6 7 8 9 10  No pain                      worst pain        Procedure Date: 01/09/22    1.  Pain score immediately before procedure: __________    2. 3 months after your procedure, do you feel better?  YES/NO    3.  If you answered YES to the above, by what percentage__________% (0-100%) has your pain been relieved.    4.  Pain Score now: _________ (at time of telephone call)

## 2022-01-09 NOTE — Progress Notes (Signed)
Ronnie Mason identified by name and DOB.  He confirmed he is having a Midline C6-7 ILESI   He ambulated to procedure area with steady gait.  He has not fallen in the last 6 months.  Fall prevention interventions: Patient provided with non-skid stockings  He denies taking any blood thinners.  He denies having a bleeding disorder.  He  Has held his medications per provider instructions.  He has not been on antibiotics for the past two weeks.   He denies a history of fainting during medical procedures.   He has not been NPO.  He undressed self without assistance.  Is He receiving a steroid injection today? Yes  If yes, has He received the COVID-19 vaccine (either brand) within the past 2 weeks? No  OR does He plan to receive the COVID-19 vaccine (either brand) within the next 2 weeks?No  PIV inserted   I verbally reviewed written discharge instructions and pain diary with him.   New consent on chart  Confirmed ride home with his [name]     He has a pre-procedure pain score of 4-5/10 bilateral posterior neck L>R, radiating tingling down left arm

## 2022-01-13 ENCOUNTER — Other Ambulatory Visit (HOSPITAL_BASED_OUTPATIENT_CLINIC_OR_DEPARTMENT_OTHER): Payer: Self-pay | Admitting: Neurology

## 2022-01-13 ENCOUNTER — Encounter (HOSPITAL_BASED_OUTPATIENT_CLINIC_OR_DEPARTMENT_OTHER): Payer: Self-pay | Admitting: Neurology

## 2022-01-13 DIAGNOSIS — G3184 Mild cognitive impairment, so stated: Secondary | ICD-10-CM

## 2022-01-13 NOTE — Telephone Encounter (Signed)
error 

## 2022-01-14 ENCOUNTER — Other Ambulatory Visit: Payer: Self-pay

## 2022-01-15 ENCOUNTER — Other Ambulatory Visit (HOSPITAL_BASED_OUTPATIENT_CLINIC_OR_DEPARTMENT_OTHER): Payer: Self-pay | Admitting: Neurology

## 2022-01-15 ENCOUNTER — Ambulatory Visit (INDEPENDENT_AMBULATORY_CARE_PROVIDER_SITE_OTHER): Payer: Medicare PPO | Admitting: Naturopathic

## 2022-01-15 DIAGNOSIS — G8929 Other chronic pain: Secondary | ICD-10-CM

## 2022-01-15 DIAGNOSIS — M542 Cervicalgia: Secondary | ICD-10-CM

## 2022-01-15 DIAGNOSIS — M5459 Other low back pain: Secondary | ICD-10-CM

## 2022-01-15 NOTE — Progress Notes (Signed)
[place] [facility] [place] Treatment: 3    Referred by: William Dalton, MD     PCP: Donnal Moat, MD    HPI: Ronnie Mason was referred to acupuncture and [place] for consultation and treatment of    Chief Complaint   Patient presents with    Acupuncture     Chronic bilateral low back pain without sciatica, cervicalgia       CC1: Neck Pain   - Has been sore but the last treatment worked and reduced pain significantly for 2 days   - More on the right today but localized without radiation   - Still tingles down the left and left arm still shakes when holding things up   - Severity: 5/10 (10 = worst)     CC2: Chronic Back Pain   - Left side of the low back is the worse   - Not much relief after the last treatment   - PT is on hold until August, and that's part of the issue of low back not improving     Functional Limitations:  1) Arm/hand shaking on the left and only able to hold a can of soup.  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/2014    Allergic rhinitis due to allergen     Carotid Sinus Hypersensitivity     Disc disorder of lumbosacral region     Dizziness     Heart murmur     Hip injury     HYPERTENSION      HYPOTENSION      Nosebleed     Strep throat     Stroke [facility])     Syncope     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([place])     URI (upper respiratory infection)         Surgical History:  Past Surgical History:   Procedure Laterality Date    L meniciscus Left 07/2017    by Dr Matthew Saras , re did left menisicus     PR ANES; COLONOSCOPY  2002    repeat in 3 years    PR ANES;  COLONOSCOPY & POLYPECTOMY  11/18/2007    repeat in 3 years    PR HALLUX RIGIDUS W/CHEILECTOMY 1ST MP JT W/O IMPLT Right 10/21/2017    Dr. Megan Salon    PR UNLISTED PROCEDURE FEMUR/KNEE      PR UNLISTED PROCEDURE HANDS/FINGERS      PR UNLISTED PROCEDURE SPINE  2013    rfa to c3 to c 7     TOE SURGERY Right 02/09/2019    toe surgery  Right 01/12/2019    Dr Wilmon Arms         Medications:  Outpatient Medications Prior to Visit   Medication Sig Dispense Refill    Acetaminophen 500 MG Oral Tab Take 2 tablets (1,000 mg) by mouth 2 times a day.      Alpha-Lipoic Acid 300 MG tablet Take 1 tablet by mouth  daily.      amLODIPine 5 MG tablet Take 1 tablet (5 mg) by mouth 2 times a day. 180 tablet 3    atorvastatin 10 MG tablet Take 1 tablet (10 mg) by mouth daily. 90 tablet 3    BENFOTIAMINE OR Take 250 mg by mouth daily.      betamethasone dipropionate 0.05 % lotion Apply topically 2 times a day. Apply to scalp and ears as needed for itching 60 mL 11    Cholecalciferol (VITAMIN D3) 2000 units Oral Cap Take 1 capsule (2,000 Units) by mouth daily. For low level 1 capsule 1    CLINDAMYCIN HCL OR Take by mouth. CLINDAMYCIN CREAM      Cyanocobalamin 1000 MCG Oral Tab one per day over-the-counter started 5/21 /2012 this level borderline low 1 Tab 1    diclofenac 1 % gel Apply 4 g topically 4 times a day. Apply to 2 gram four times daily to neck, trapezius muscle, and low back. Use a max of 32 grams a day. 300 g 4    DULoxetine 60 MG DR capsule Take 1 capsule (60 mg) by mouth 2 times a day. 180 capsule 2    EPINEPHrine 0.3 MG/0.3ML auto-injector INJECT INTRAMUSCULARLY ONCE AS NEEDED FOR SEVERE ALLERGIC REACTION 4 each 3    ibuprofen 200 MG tablet Take 1 tablet (200 mg) by mouth daily as needed (pain).      lidocaine 5 % patch Apply 1 patch onto the skin daily. Apply to painful area for up to 12 hours in a 24 hour period. (Patient taking differently: Apply 1 patch onto the skin daily as needed. Apply to painful area for up to 12  hours in a 24 hour period.) 30 patch 5    lisinopril 20 MG tablet Take 1 tablet (20 mg) by mouth every 12 hours. 180 tablet 1    naltrexone 50 MG tablet take 1 tablet by mouth once daily with food 30 tablet 1    Omega-3 Fatty Acids (OMEGA 3 OR) Take 1 tablet by mouth daily.      pregabalin 300 MG capsule Take 1 capsule (300 mg) by mouth 2 times a day. 180 capsule 1    primidone 50 MG tablet Take 1 tablet (50 mg) by mouth 2 times a day. 180 tablet 4     No facility-administered medications prior to visit.        Allergies:  Review of patient's allergies indicates:  Allergies   Allergen Reactions    Adhesives Skin: Rash     Glue on lido patches    Bee Venom Skin: Hives, Skin: Itching and Swelling    Gabapentin Other     Flu like symptoms, diarrhea, body ache upset stomach    Methocarbamol Other     Patient's wife reports cognitive difficulties ("goofy")        Family/Social history:  family history includes Colon Cancer in his father; Heart (other) in his mother; Other Family Hx in an other family member. There is no history of Cancer.       OBJECTIVE:  Vitals:  There were no vitals taken for this visit.  Pulse: Deferred.  Tongue: Deferred. Patient wearing a face covering.    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    First 15 Minutes: prone   (BL) UB 10, Bai Lao, ashi 1.5 cun lateral to HTJJ C6-7, SI 13, GB 21   Needles in: 10  Needles out: 10    Second 15 Minutes: prone  (BL) UB 60, UB 53, Yao Yan, UB 23, UB 28  (L) P3, SI 9, SI 10, HTJJ C4-5  Needles in: 14  Needles out: 14     Patient tolerated this procedure well and without complications.     Time spent for face-to-face for acupuncture: 23 minutes     Total time spent for today's care, including review of previous medical records and creation of care plan: 45 minutes.               Wu-Hsun Hermenia Bers, ND, LAc  Licensed Acupuncturist, Teaching Associate  [facility]  Department of [facility] medicine  [place] [facility] [place]

## 2022-01-21 ENCOUNTER — Other Ambulatory Visit (INDEPENDENT_AMBULATORY_CARE_PROVIDER_SITE_OTHER): Payer: Self-pay | Admitting: Internal Medicine

## 2022-01-21 DIAGNOSIS — I10 Essential (primary) hypertension: Secondary | ICD-10-CM

## 2022-01-23 MED ORDER — LISINOPRIL 20 MG OR TABS
20.0000 mg | ORAL_TABLET | Freq: Two times a day (BID) | ORAL | 0 refills | Status: DC
Start: 2022-01-23 — End: 2022-04-27

## 2022-01-29 ENCOUNTER — Telehealth (HOSPITAL_BASED_OUTPATIENT_CLINIC_OR_DEPARTMENT_OTHER): Payer: Self-pay

## 2022-01-29 NOTE — Telephone Encounter (Signed)
Pt  wife Hilarie Fredrickson reports that the pt is refusing to get the MRI Brain, which is scheduled for 02/23/22 at [facility]. She reports that he wants to know why he needs to get this scan. She reports he needs some convincing to get this done for review at his 04/07/22 scan. She is not sure why he does not like getting this MRI. She reports that she is not able to get him to provide any details as to why he doesn't want to get this scan.

## 2022-01-30 ENCOUNTER — Ambulatory Visit (INDEPENDENT_AMBULATORY_CARE_PROVIDER_SITE_OTHER): Payer: Medicare PPO | Admitting: Acupuncturist

## 2022-01-30 DIAGNOSIS — G8929 Other chronic pain: Secondary | ICD-10-CM

## 2022-01-30 DIAGNOSIS — M5459 Other low back pain: Secondary | ICD-10-CM

## 2022-01-30 DIAGNOSIS — M545 Low back pain, unspecified: Secondary | ICD-10-CM

## 2022-01-30 NOTE — Progress Notes (Signed)
[place] of [facility]    Acupuncture Treatment: 4    Referred by: William Dalton, MD     PCP: Donnal Moat, MD    HPI: Roley Ode was referred to acupuncture and [place] for consultation and treatment of    Chief Complaint   Patient presents with    Acupuncture     Bilateral low back pain without sciatica, cervicalgia       CC1: Neck Pain   - No change but not getting worse  - Noticed a bump around the needles site but it went away since then. There was no pain  - Left side of neck radiates down to mid upper back and shoulder  - Numbing and tingling sensation on his left upper back and shoulder    CC2: Chronic Back Pain   - No change but not getting worse  - Noting he has the pain for long years.   - Left side at the lower back, there is knots  - Not much relief after the last treatment     Functional Limitations:  1) Arm/hand shaking on the left and only able to hold a can of soup.  2) Pain getting out of the car   3) Difficulty from seated to standing   4) Only able to walk 3-4 blocks in distance     ROS:  ENT: no other episodes of nosebleed  Allergies: Noting he has bee venom allergies and carrying around EpiPen.     Note: He will be spending time away over the holiday time off and noting he will make sure to bring EpiPen as he will be spending time in the nature.       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    Spondylosis of cervical region without myelopathy or radiculopathy 05/28/2014    Allergic rhinitis due to allergen     Carotid Sinus Hypersensitivity     Disc disorder of lumbosacral region     Dizziness     Heart murmur     Hip injury     HYPERTENSION      HYPOTENSION      Nosebleed     Strep throat     Stroke [facility])     Syncope     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([place])     URI (upper respiratory infection)         Surgical History:  Past  Surgical History:   Procedure Laterality Date    L meniciscus Left 07/2017    by Dr Matthew Saras , re did left menisicus     PR ANES; COLONOSCOPY  2002    repeat in 3 years    PR ANES; COLONOSCOPY & POLYPECTOMY  11/18/2007    repeat in 3 years    PR HALLUX RIGIDUS W/CHEILECTOMY 1ST MP JT W/O IMPLT Right 10/21/2017    Dr. Megan Salon    PR UNLISTED PROCEDURE FEMUR/KNEE      PR UNLISTED PROCEDURE HANDS/FINGERS      PR UNLISTED PROCEDURE SPINE  2013    rfa to c3 to c 7     TOE SURGERY Right 02/09/2019    toe surgery  Right 01/12/2019    Dr Wilmon Arms         Medications:  Outpatient Medications Prior to Visit   Medication  Sig Dispense Refill    Acetaminophen 500 MG Oral Tab Take 2 tablets (1,000 mg) by mouth 2 times a day.      Alpha-Lipoic Acid 300 MG tablet Take 1 tablet by mouth daily.      amLODIPine 5 MG tablet Take 1 tablet (5 mg) by mouth 2 times a day. 180 tablet 3    atorvastatin 10 MG tablet Take 1 tablet (10 mg) by mouth daily. 90 tablet 3    BENFOTIAMINE OR Take 250 mg by mouth daily.      betamethasone dipropionate 0.05 % lotion Apply topically 2 times a day. Apply to scalp and ears as needed for itching 60 mL 11    Cholecalciferol (VITAMIN D3) 2000 units Oral Cap Take 1 capsule (2,000 Units) by mouth daily. For low level 1 capsule 1    CLINDAMYCIN HCL OR Take by mouth. CLINDAMYCIN CREAM      Cyanocobalamin 1000 MCG Oral Tab one per day over-the-counter started 5/21 /2012 this level borderline low 1 Tab 1    diclofenac 1 % gel Apply 4 g topically 4 times a day. Apply to 2 gram four times daily to neck, trapezius muscle, and low back. Use a max of 32 grams a day. 300 g 4    DULoxetine 60 MG DR capsule Take 1 capsule (60 mg) by mouth 2 times a day. 180 capsule 2    EPINEPHrine 0.3 MG/0.3ML auto-injector INJECT INTRAMUSCULARLY ONCE AS NEEDED FOR SEVERE ALLERGIC REACTION 4 each 3    ibuprofen 200 MG tablet Take 1 tablet (200 mg) by mouth daily as needed (pain).      lidocaine 5 % patch Apply 1 patch onto the skin  daily. Apply to painful area for up to 12 hours in a 24 hour period. (Patient taking differently: Apply 1 patch onto the skin daily as needed. Apply to painful area for up to 12 hours in a 24 hour period.) 30 patch 5    lisinopril 20 MG tablet Take 1 tablet (20 mg) by mouth every 12 hours. Please complete labs 180 tablet 0    naltrexone 50 MG tablet take 1 tablet by mouth once daily with food 30 tablet 1    Omega-3 Fatty Acids (OMEGA 3 OR) Take 1 tablet by mouth daily.      pregabalin 300 MG capsule Take 1 capsule (300 mg) by mouth 2 times a day. 180 capsule 1    primidone 50 MG tablet Take 1 tablet (50 mg) by mouth 2 times a day. 180 tablet 4     No facility-administered medications prior to visit.        Allergies:  Review of patient's allergies indicates:  Allergies   Allergen Reactions    Adhesives Skin: Rash     Glue on lido patches    Bee Venom Skin: Hives, Skin: Itching and Swelling    Gabapentin Other     Flu like symptoms, diarrhea, body ache upset stomach    Methocarbamol Other     Patient's wife reports cognitive difficulties ("goofy")        Family/Social history:  family history includes Colon Cancer in his father; Heart (other) in his mother; Other Family Hx in an other family member. There is no history of Cancer.       OBJECTIVE:  Vitals:  There were no vitals taken for this visit.  Pulse: Deferred.  Tongue: Deferred. Patient wearing a face covering.    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    First 15 Minutes: Supine   (BL) YT, Du24, Yaotong xue, GB31  R: Jian Zhong, Si Ma  L: GB34    Needles in: 14  Needles in: 14    Second 9 Minutes: Supine  R: ST38  L: YNSA D, Lumbar    Needles in: 3  Needles out: 3    Pyonex (0.24mm) were placed at L - SJ15, Allayne Gitelman, [place]. I advised to keep them upto 5 days and take them off. However, if there is any irritability or discomfort felt, then remove them sooner. I also advised to discard the used one safely.      Post Treatment Note:   Davis Ambrosini tolerated the all the treatment well without any complications.      Time Spent:   - Time spent for face-to-face for evaluation and management: 5 minutes.   - Time spent for face-to-face for acupuncture: 24 minutes.  Total time spent for patient's care, including chart review, relevant lab/imaging review, and creation of care plan: 55 minutes.      Augusto Gamble, AEMP, DACM, LAc  Licensed Acupuncturist, Paramedic for [employer]  Department of Family medicine  [place] [facility] [place]

## 2022-01-30 NOTE — Telephone Encounter (Addendum)
Called and spoke to [place] on her mobile after no answer on home phone. She is not with Ronnie Mason. He is not home at this time but will call him later today. She says he is refusing MRI brain. He had recent MRI and she did not note any concerns with that imaging. She has mentioned medication to him for the imaging and he still refused.     1409: Called and spoke with Ronnie Mason. He refused to have an MRI brain since he had one 6 years ago and doesn't need it repeated. He says he does not like it but denies claustrophobia or anxiety. He said he can see him in person and that should be enough. I explained that there are changes over time and we absolutely need the MRI.He says if he has to get that imaging, then he will not see Dr Mayford Knife.

## 2022-02-06 ENCOUNTER — Ambulatory Visit (INDEPENDENT_AMBULATORY_CARE_PROVIDER_SITE_OTHER): Payer: Medicare PPO | Admitting: Acupuncturist

## 2022-02-13 ENCOUNTER — Telehealth (HOSPITAL_BASED_OUTPATIENT_CLINIC_OR_DEPARTMENT_OTHER): Payer: Self-pay

## 2022-02-13 ENCOUNTER — Ambulatory Visit (INDEPENDENT_AMBULATORY_CARE_PROVIDER_SITE_OTHER): Payer: Medicare PPO | Admitting: Acupuncturist

## 2022-02-13 DIAGNOSIS — M545 Low back pain, unspecified: Secondary | ICD-10-CM

## 2022-02-13 DIAGNOSIS — G8929 Other chronic pain: Secondary | ICD-10-CM

## 2022-02-13 NOTE — Progress Notes (Signed)
[place] of [facility] for [facility]    Acupuncture Treatment: 5    Referred by: Drexel Iha, MD     PCP: [name], MD    HPI: Zaine Elsass was referred to acupuncture and [place] medicine for consultation and treatment of    Chief Complaint   Patient presents with    Acupuncture     Bilateral low back pain without sciatica, cervicalgia     Alexandro noted that last treatment was helpful but still lower back pain and neck pain is there. He noted he had a dental procedure yesterday which lasted 3 hours. Seating at the dentist chair longer hours aggravated his neck and lower back pain noticeably. He is experiencing the higher level of the pain today.     CC2: Chronic Back Pain   - Aggravation after seating on the dentist chair for 3 hours  - Noting he has the pain for long years.   - Left side at the lower back, there is knots  - Slight relief after the last treatment but today feeling more after seating on the dentist chair for long hours    Related issues   Neck and shoulder pain  - Feeling very tight and sore  - Left side of neck radiates down to mid upper back and shoulder  - Numbing and tingling sensation on his left upper back and shoulder    ROS:  ENT: no other episodes of nosebleed  Mouth: Had a dental procedure yesterday and feeling sore at the front teeth and gum.       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    Allergic rhinitis due to allergen     Carotid Sinus Hypersensitivity     Disc disorder of lumbosacral region     Dizziness     Heart murmur     Hip injury     HYPERTENSION      HYPOTENSION      Nosebleed     Strep throat     Stroke [facility])     Syncope     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([facility])     URI (upper respiratory infection)         Surgical History:  Past Surgical History:    Procedure Laterality Date    L meniciscus Left 07/2017    by Dr Marcelle Overlie , re did left menisicus     PR ANES; COLONOSCOPY  2002    repeat in 3 years    PR ANES; COLONOSCOPY & POLYPECTOMY  11/18/2007    repeat in 3 years    PR HALLUX RIGIDUS W/CHEILECTOMY 1ST MP JT W/O IMPLT Right 10/21/2017    Dr. Donn Pierini    PR UNLISTED PROCEDURE FEMUR/KNEE      PR UNLISTED PROCEDURE HANDS/FINGERS      PR UNLISTED PROCEDURE SPINE  2013    rfa to c3 to c 7     TOE SURGERY Right 02/09/2019    toe surgery  Right 01/12/2019    Dr Hilarie Fredrickson         Medications:  Outpatient Medications Prior to Visit   Medication Sig Dispense Refill    Acetaminophen 500 MG Oral Tab Take 2 tablets (1,000 mg) by mouth 2 times a day.  Alpha-Lipoic Acid 300 MG tablet Take 1 tablet by mouth daily.      amLODIPine 5 MG tablet Take 1 tablet (5 mg) by mouth 2 times a day. 180 tablet 3    atorvastatin 10 MG tablet Take 1 tablet (10 mg) by mouth daily. 90 tablet 3    BENFOTIAMINE OR Take 250 mg by mouth daily.      betamethasone dipropionate 0.05 % lotion Apply topically 2 times a day. Apply to scalp and ears as needed for itching 60 mL 11    Cholecalciferol (VITAMIN D3) 2000 units Oral Cap Take 1 capsule (2,000 Units) by mouth daily. For low level 1 capsule 1    CLINDAMYCIN HCL OR Take by mouth. CLINDAMYCIN CREAM      Cyanocobalamin 1000 MCG Oral Tab one per day over-the-counter started 5/21 /2012 this level borderline low 1 Tab 1    diclofenac 1 % gel Apply 4 g topically 4 times a day. Apply to 2 gram four times daily to neck, trapezius muscle, and low back. Use a max of 32 grams a day. 300 g 4    DULoxetine 60 MG DR capsule Take 1 capsule (60 mg) by mouth 2 times a day. 180 capsule 2    EPINEPHrine 0.3 MG/0.3ML auto-injector INJECT INTRAMUSCULARLY ONCE AS NEEDED FOR SEVERE ALLERGIC REACTION 4 each 3    ibuprofen 200 MG tablet Take 1 tablet (200 mg) by mouth daily as needed (pain).      lidocaine 5 % patch Apply 1 patch onto the skin daily. Apply to  painful area for up to 12 hours in a 24 hour period. (Patient taking differently: Apply 1 patch onto the skin daily as needed. Apply to painful area for up to 12 hours in a 24 hour period.) 30 patch 5    lisinopril 20 MG tablet Take 1 tablet (20 mg) by mouth every 12 hours. Please complete labs 180 tablet 0    naltrexone 50 MG tablet take 1 tablet by mouth once daily with food 30 tablet 1    Omega-3 Fatty Acids (OMEGA 3 OR) Take 1 tablet by mouth daily.      pregabalin 300 MG capsule Take 1 capsule (300 mg) by mouth 2 times a day. 180 capsule 1    primidone 50 MG tablet Take 1 tablet (50 mg) by mouth 2 times a day. 180 tablet 4     No facility-administered medications prior to visit.        Allergies:  Review of patient's allergies indicates:  Allergies   Allergen Reactions    Adhesives Skin: Rash     Glue on lido patches    Bee Venom Skin: Hives, Skin: Itching and Swelling    Gabapentin Other     Flu like symptoms, diarrhea, body ache upset stomach    Methocarbamol Other     Patient's wife reports cognitive difficulties ("goofy")        Family/Social history:  family history includes Colon Cancer in his father; Heart (other) in his mother; Other Family Hx in an other family member. There is no history of Cancer.       OBJECTIVE:  Vitals:  There were no vitals taken for this visit.  Pulse: Slightly slippery.  Tongue: Deferred. Patient wearing a face covering.      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    First 15 Minutes: Supine   (BL) YT, Du24, Ziabg Zgibg  R:  Si Ma Zhong, ST38  L: LI4, Si Ma Zhong, Xi Ma Sheng, GB34    Needles in: 14  Needles in: 14    Second 10 Minutes: Seating position  Mid: Du14 (just tap in), HTJJ - C7 (just tap in)  R: ST38, San Jian, SI3  Bridgett Larsson Shui appiled on Left shoulder (soked in tissue) for 10 mins    Needles in: 3  Needles out: 3    Pyonex (0.78mm) were placed at L - GB12, GB21, Allayne Gitelman, [place],  YY50. I advised to keep them upto 5 days and take them off. However, if there is any irritability or discomfort felt, then remove them sooner. I also advised to discard the used one safely.      I noted Tyrin that he has small open wound looks red on his left back (near the center), and advised him to monitor it and apply ointment to avoid infection.     Post Treatment Note:   Sony Schlarb tolerated the all the treatment well without any complications.      Time Spent:   - Time spent for face-to-face for evaluation and management: 5 minutes.   - Time spent for face-to-face for acupuncture: 25 minutes.  Total time spent for patient's care, including chart review, relevant lab/imaging review, and creation of care plan: 55 minutes.      Augusto Gamble, AEMP, DACM, LAc  Licensed Acupuncturist, Paramedic for [employer]  Department of Family medicine  [place] [facility] [place]

## 2022-02-13 NOTE — Telephone Encounter (Signed)
LVM for the pt advised that, per Dr Mayford Knife, he does not need the MRI Brain prior to his 04/07/2022 appointment with Dr Murtis Sink. Advised the MRI has been canceled but his appointment for 9/5 9:30am is still on the books. He should come to this appointment as scheduled and discuss the need for an MRi with the doctor.

## 2022-02-17 MED ORDER — METHYLPREDNISOLONE ACETATE 40 MG/ML IJ SUSP
40.0000 mg | Freq: Once | INTRAMUSCULAR | Status: AC
Start: 2022-02-20 — End: 2022-02-20
  Administered 2022-02-20: 40 mg via INTRAMUSCULAR

## 2022-02-17 NOTE — Progress Notes (Signed)
PROCEDURE NOTE: TRIGGER POINT INJECTIONS UNDER ULTRASOUND GUIDANCE    PROCEDURE  Trigger point injection x 11  to the left  lower back  Procedure peformed by: Attending physician with resident present    PRE-PROCEDURE DIAGNOSIS  (M79.18) Myofascial pain  (primary encounter diagnosis).    POST-PROCEDURE DIAGNOSIS  (M79.18) Myofascial pain  (primary encounter diagnosis).    INDICATION FOR INJECTION  Myofascial pain of the lower back    ATTENDING PHYSICIAN:  [name], MD    FELLOW / RESIDENT:  Wayland Salinas    SEDATION:  None    NPO STATUS:  None    INTRAVENOUS LINE: None    Guidance: Ultrasound guidance used for this procedure, appropriate images viewed and saved to the medical record    DESCRIPTION OF THE PROCEDURE  Position: Prone  Monitoring: Pulse oximetry    Final Verification/Time-out: Performed and documented.  Preparation: The injection area was prepped with Chloraprep.  Imaging for needle placement: Transducer placed over the lower back region.  Needle insertion: Out-of plane.  Imaging conditions: Good visualization of the muscle planes.  Needle used: 25g, 2 inch.  At each trigger point, 1-2 ml of an injectate consisting of Bupivicaine 0.25% x 9 mL and methylprednisolone 40 mg/mL x  1 mL was injected for a total of 10 mL in 6 trigger points. An additional 1-2 ml of an injectate consisting of Bupivicaine 0.25% in 5 trigger points for a total of 7 mL    The following muscles were injected:    [x]  left latissimus dorsi   [x]  bilateral quadratus lumborum   [x]  bilateral erector spinae   [x]  bilateral multifidus at spinal level L5  Bilateral gluteus medius     The needle was removed uneventfully and hemostasis was maintained.     POST-PROCEDURE:  He was escorted to the recovery area in stable condition where he made an uneventful recovery.    COMPLICATIONS:  none     PLAN  Follow up in clinic

## 2022-02-19 ENCOUNTER — Ambulatory Visit (INDEPENDENT_AMBULATORY_CARE_PROVIDER_SITE_OTHER): Payer: Medicare PPO | Admitting: Naturopathic

## 2022-02-19 DIAGNOSIS — M542 Cervicalgia: Secondary | ICD-10-CM

## 2022-02-19 DIAGNOSIS — G8929 Other chronic pain: Secondary | ICD-10-CM

## 2022-02-19 DIAGNOSIS — M545 Low back pain, unspecified: Secondary | ICD-10-CM

## 2022-02-19 NOTE — Progress Notes (Signed)
[place] [facility]    Acupuncture Treatment: 6    Referred by: Drexel Iha, MD     PCP: [name], MD    HPI: Ronnie Mason was referred to acupuncture and [place] medicine for consultation and treatment of    Chief Complaint   Patient presents with    Acupuncture     Bilateral low back pain without sciatica     CC1: Left Neck and Shoulder Pain    - The last treatment was one of the best he has had, provided relief for 4 days   - Left side is a bit worse today (almost always on the left)   - Left shoulder can do a bit more abduction, but past 90 degrees then pulls on the front of the shoulder   - Hope to regain strength so he can resume weight lifting     CC2: Chronic Back Pain   - Will get trigger point treatment tomorrow for the back   - Not much change from acupuncture but also knows that it has been chronic     Functional Limitations:  1) Arm/hand shaking on the left and only able to hold a can of soup.  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    Allergic rhinitis due to allergen     Carotid Sinus Hypersensitivity     Disc disorder of lumbosacral region     Dizziness     Heart murmur     Hip injury     HYPERTENSION      HYPOTENSION      Nosebleed     Strep throat     Stroke [facility])     Syncope     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(HCC)     URI (upper respiratory infection)         Surgical History:  Past Surgical History:   Procedure Laterality Date    L meniciscus Left 07/2017    by Dr Marcelle Overlie , re did left menisicus     PR ANES; COLONOSCOPY  2002    repeat in 3 years    PR ANES; COLONOSCOPY & POLYPECTOMY  11/18/2007    repeat in 3 years    PR HALLUX  RIGIDUS W/CHEILECTOMY 1ST MP JT W/O IMPLT Right 10/21/2017    Dr. Donn Pierini    PR UNLISTED PROCEDURE FEMUR/KNEE      PR UNLISTED PROCEDURE HANDS/FINGERS      PR UNLISTED PROCEDURE SPINE  2013    rfa to c3 to c 7     TOE SURGERY Right 02/09/2019    toe surgery  Right 01/12/2019    Dr Hilarie Fredrickson         Medications:  Outpatient Medications Prior to Visit   Medication Sig Dispense Refill    Acetaminophen 500 MG Oral Tab Take 2 tablets (1,000 mg) by mouth 2 times a day.      Alpha-Lipoic Acid 300 MG tablet Take 1 tablet by mouth daily.      amLODIPine 5 MG tablet Take 1 tablet (5 mg) by  mouth 2 times a day. 180 tablet 3    atorvastatin 10 MG tablet Take 1 tablet (10 mg) by mouth daily. 90 tablet 3    BENFOTIAMINE OR Take 250 mg by mouth daily.      betamethasone dipropionate 0.05 % lotion Apply topically 2 times a day. Apply to scalp and ears as needed for itching 60 mL 11    Cholecalciferol (VITAMIN D3) 2000 units Oral Cap Take 1 capsule (2,000 Units) by mouth daily. For low level 1 capsule 1    CLINDAMYCIN HCL OR Take by mouth. CLINDAMYCIN CREAM      Cyanocobalamin 1000 MCG Oral Tab one per day over-the-counter started 5/21 /2012 this level borderline low 1 Tab 1    diclofenac 1 % gel Apply 4 g topically 4 times a day. Apply to 2 gram four times daily to neck, trapezius muscle, and low back. Use a max of 32 grams a day. 300 g 4    DULoxetine 60 MG DR capsule Take 1 capsule (60 mg) by mouth 2 times a day. 180 capsule 2    EPINEPHrine 0.3 MG/0.3ML auto-injector INJECT INTRAMUSCULARLY ONCE AS NEEDED FOR SEVERE ALLERGIC REACTION 4 each 3    ibuprofen 200 MG tablet Take 1 tablet (200 mg) by mouth daily as needed (pain).      lidocaine 5 % patch Apply 1 patch onto the skin daily. Apply to painful area for up to 12 hours in a 24 hour period. (Patient taking differently: Apply 1 patch onto the skin daily as needed. Apply to painful area for up to 12 hours in a 24 hour period.) 30 patch 5    lisinopril 20 MG tablet Take 1  tablet (20 mg) by mouth every 12 hours. Please complete labs 180 tablet 0    naltrexone 50 MG tablet take 1 tablet by mouth once daily with food 30 tablet 1    Omega-3 Fatty Acids (OMEGA 3 OR) Take 1 tablet by mouth daily.      pregabalin 300 MG capsule Take 1 capsule (300 mg) by mouth 2 times a day. 180 capsule 1    primidone 50 MG tablet Take 1 tablet (50 mg) by mouth 2 times a day. 180 tablet 4     No facility-administered medications prior to visit.        Allergies:  Review of patient's allergies indicates:  Allergies   Allergen Reactions    Adhesives Skin: Rash     Glue on lido patches    Bee Venom Skin: Hives, Skin: Itching and Swelling    Gabapentin Other     Flu like symptoms, diarrhea, body ache upset stomach    Methocarbamol Other     Patient's wife reports cognitive difficulties ("goofy")        Family/Social history:  family history includes Colon Cancer in his father; Heart (other) in his mother; Other Family Hx in an other family member. There is no history of Cancer.       OBJECTIVE:  Vitals:  There were no vitals taken for this visit.  Pulse: wiry, choppy   Tongue: Deferred. Patient wearing a face covering.    MSK: gait and station intact  - Increased tension in (L) upper trap, paracervicals, pecs, lats     Assessment/Plan:    Following an evaluation including the history of present illness/s, medical history and examination, the patient's clinical presentation suggest probable:    (M54.50,  G89.29) Chronic bilateral low back pain without sciatica  (primary  encounter diagnosis)    (M54.2) Cervicalgia      Diagnosis According to [place] Medicine:  Qi stagnation and Blood stasis in UB Channel     Treatment Principle/Goals:   Short-term: reduce muscle hypertonicity, decrease discomfort/pain, improve circulation and increase range of motion  Long-term: avoid the development of chronic pain syndrome and minimize probability of future exacerbations.    First 15 Minutes: supine   (L) ST 38, UB 62, LI 4,  Jian Zhong, LU 2, LI 15, SJ 14   (R) ST 40, SI 3, LIV 3  DU 24, YT  Needles in: 10  Needles out: 10    Second 15 Minutes: seated   (R) San Jian (3) with shoulder mobilization   DU 4  (BL) HTJJ C6-7  (L) SI 9 + 1 ashi, LI 14  Needles in: 9  Needles out: 9  + Zheng Gu Shui applied to (L) pecs and (L) lats/teres - preformed Gua Sha to loosen up the tension.     Immediately after treatment, Eliud noticed increased ability to abduction without as much pulling at the front of the shoulder and less pain at the top of the shoulder.     Patient tolerated this procedure well and without complications.     Time spent for face-to-face for acupuncture: 30 minutes     Total time spent for today's care, including review of previous medical records and creation of care plan: 45 minutes.               Wu-Hsun Hermenia Bers, ND, LAc  Licensed Acupuncturist, Teaching Associate  [facility]  Department of [facility] medicine  [place] [facility] [place]

## 2022-02-20 ENCOUNTER — Ambulatory Visit: Payer: Medicare PPO | Attending: Anesthesiology | Admitting: Anesthesiology

## 2022-02-20 VITALS — BP 127/80 | HR 81 | Temp 97.4°F | Resp 16

## 2022-02-20 DIAGNOSIS — G894 Chronic pain syndrome: Secondary | ICD-10-CM | POA: Insufficient documentation

## 2022-02-20 DIAGNOSIS — G8929 Other chronic pain: Secondary | ICD-10-CM | POA: Insufficient documentation

## 2022-02-20 DIAGNOSIS — M5412 Radiculopathy, cervical region: Secondary | ICD-10-CM | POA: Insufficient documentation

## 2022-02-20 DIAGNOSIS — M7918 Myalgia, other site: Secondary | ICD-10-CM | POA: Insufficient documentation

## 2022-02-20 DIAGNOSIS — M545 Low back pain, unspecified: Secondary | ICD-10-CM | POA: Insufficient documentation

## 2022-02-20 MED ORDER — METHYLPREDNISOLONE ACETATE 40 MG/ML IJ SUSP
INTRAMUSCULAR | Status: AC
Start: 2022-02-20 — End: 2022-02-20
  Filled 2022-02-20: qty 1

## 2022-02-20 NOTE — Progress Notes (Signed)
Mr. Thede identified by name and DOB.   He confirmed he is having a [place] Low back TPI   He ambulated to procedure area with steady gait.  He has not fallen in the last 6 months.  Fall prevention interventions: Patient provided with non-skid stockings  He denies taking any blood thinners.  He denies having a bleeding disorder.  He  Has held his medications per provider instructions.  He has not been on antibiotics for the past two weeks.   He denies a history of fainting during medical procedures.   He has not been NPO.  He undressed self without assistance.  Is He receiving a steroid injection today? Yes   If yes, has He received the COVID-19 vaccine (either brand) within the past 2 weeks? No  OR does He plan to receive the COVID-19 vaccine (either brand) within the next 2 weeks? No  I verbally reviewed written discharge instructions and pain diary with him.   New consent on chart  Confirmed ride home with his spouse [name]     He has a pre-procedure pain score of 4-5/10 left low back

## 2022-02-20 NOTE — Progress Notes (Signed)
Pain score prior to procedure:  5/10  Pain score after procedure:  0/10    Ronnie Mason met discharge criteria:  A/OX4/baseline, VSS/returned to baseline, ambulatory with steady gait. Site clean dry and intact.  Written and verbal discharge instructions, including emergency contact phone numbers, reviewed with Ronnie Mason. Verbal understanding obtained from him.  Ronnie Mason dressed self without assistance. He was discharged in stable condition, ambulatory with steady gait to home with all belongings and with ride/responsible person.

## 2022-02-20 NOTE — Patient Instructions (Signed)
[facility]  Post-Procedure Discharge Instructions      After the procedure:  • Immediate and complete pain relief is rare  • Numbness and/or weakness in the area of your body supplied by the injected nerve; these symptoms should resolve but may last up to several hours  • Some soreness and bruising at the injection site(s)    Activities:  • If you have any weakness or numbness caused by the injection, DO NOT DRIVE or operate machinery and limit other activity until sensation returns to normal.  • You may resume regular exercises/activities as tolerated.  • If you received sedation, DO NOT DRIVE or operate machinery for 24 hours.    Medications:  If you stopped taking any blood thinning medications such as Coumadin or Plavix, you may resume these tomorrow unless specified differently by the prescribing physician.    Site care:  • You may remove the band-aid after 6 hours.  • You may shower today. No swimming, tub baths or hot tubs for 24 hours following your procedure.  • For the first 48 hours, apply ice packs to the injection site for 15-20 minutes hourly as needed for comfort.  Wrap a light towel or cloth around ice packs and heating pad to protect the skin.  • After 48 hours, use a warm heating pad to the injection site for 15-20 minutes hourly as needed for comfort.    If you received steroids today:  • Steroid medications may cause facial flushing, occasional low-grade fevers, hiccups, insomnia, headaches, water retention, increased appetite, increased heart rate, and abdominal cramping or bloating. These side effects occur in only about 5 percent of patients and commonly disappear within one to three days after the injection.  • If you are diabetic check your blood sugar more frequently than usual as you may develop an increase in blood sugar for the next 10-14 days. Contact your diabetes physician if this occurs.    Call us if you develop any of the following symptoms in the next 7  days:  • Fever above 100 degrees F     • Any unusual increase in your level of pain  • Swelling, bleeding, redness, or increased tenderness at the procedure or IV site  • Headache not relieved by Tylenol (if you had an epidural steroid injection)  .    Contact us:  Anytime, 24 hours/day, 7 days/week at [phone]    [facility]  Patient Self-Administered 6 Hour Pain Diary    1.  For accuracy keep this diary with you during the 6 hour period so you can write down your pain scores as you go, at the time noted on your diary.  During the 6 hours, please document your pain scores while doing activities that provoke your pain as well as when you are not doing those activities.      2.  Within 24 hours of completing this pain score diary, please send us your pain score diary either by:  1) Scanning into [facility]  2)  Faxing to [phone] or 3)  Calling the pain score voicemail line at [phone].  Leave the spelling of your first and last name,  U#, date of birth, date of service and the 20 numbers you wrote below, including pre-procedure, immediately post procedure and % pain relief.  After that, you may leave any comments regarding your post block experience that may be helpful (e.g. was able to walk, much worse, had to take   medications).      Use this scale to rate your pain  0 1 2 3 4 5 6 7 8 9 10  No pain                                          worst pain     TIME PAIN SCORE WITH PAINFUL ACTIVITY PAIN SCORE WITHOUT PAINFUL ACTIVITY   Before injections:       Immediately after injections:       30 minutes after injections:       1 hour after injections:       2 hours after injections:       3 hours after injections:       4 hours after injections:       5 hours after injections:       6 hours after injections:       When I had the lowest pain score, my % (percentage) pain relief:          IT IS ESSENTIAL YOU CALL IN YOUR PAIN DIARY RESULTS BEFORE ANY FURTHER PROCEDURE(S) CAN BE SCHEDULED.

## 2022-02-23 ENCOUNTER — Ambulatory Visit (HOSPITAL_COMMUNITY): Payer: Medicare PPO

## 2022-03-05 ENCOUNTER — Encounter (HOSPITAL_COMMUNITY): Payer: Self-pay

## 2022-03-12 ENCOUNTER — Ambulatory Visit (INDEPENDENT_AMBULATORY_CARE_PROVIDER_SITE_OTHER): Payer: Medicare PPO | Admitting: Acupuncturist

## 2022-03-12 DIAGNOSIS — M7918 Myalgia, other site: Secondary | ICD-10-CM

## 2022-03-12 DIAGNOSIS — M5459 Other low back pain: Secondary | ICD-10-CM

## 2022-03-12 DIAGNOSIS — M545 Low back pain, unspecified: Secondary | ICD-10-CM

## 2022-03-12 DIAGNOSIS — G894 Chronic pain syndrome: Secondary | ICD-10-CM

## 2022-03-12 DIAGNOSIS — M5412 Radiculopathy, cervical region: Secondary | ICD-10-CM

## 2022-03-12 NOTE — Progress Notes (Signed)
[place] [facility]    Acupuncture Treatment: 5    Referred by: Ronnie Iha, MD     PCP: Ronnie Medal, MD    HPI: Ronnie Mason was referred to acupuncture and [place] medicine for consultation and treatment of    Chief Complaint   Patient presents with    Acupuncture     Chronic bilateral low back pain without sciatica, cervicalgia       CC2: Chronic Back Pain   - Lower back pain has been feeling less lately  - Pain scale 2-3/10 feeling better    Related issues   Neck and shoulder pain  - Left shoulder and neck are very sore, and feeling tingling sensation upto left side elbow.   - Neck feeling very tight, PT helped had a good massage yesterday  - Pain scale: 5-6/10  - Limited ROM: 70 degree of Abduction  - Feeling very tight and sore  - Left side of neck radiates down to mid upper back and shoulder  - Numbing and tingling sensation on his left upper back and shoulder    ROS:  ENT: no other episodes of nosebleed  Mouth: Had a dental procedure yesterday and feeling sore at the front teeth and gum.   Skin: Ronnie Mason noted that his PT pointed out that he has a large bruise on his mid-lower back. He dose not remember getting bruise.       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    Allergic rhinitis due to allergen     Carotid Sinus Hypersensitivity     Disc disorder of lumbosacral region     Dizziness     Heart murmur     Hip injury     HYPERTENSION      HYPOTENSION      Nosebleed     Strep throat     Stroke [facility])     Syncope     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(HCC)     URI (upper respiratory infection)         Surgical History:  Past Surgical History:   Procedure Laterality Date    L meniciscus Left 07/2017    by Dr Ronnie Mason , re did left menisicus     PR ANES; COLONOSCOPY  2002     repeat in 3 years    PR ANES; COLONOSCOPY & POLYPECTOMY  11/18/2007    repeat in 3 years    PR HALLUX RIGIDUS W/CHEILECTOMY 1ST MP JT W/O IMPLT Right 10/21/2017    Dr. Donn Mason    PR UNLISTED PROCEDURE FEMUR/KNEE      PR UNLISTED PROCEDURE HANDS/FINGERS      PR UNLISTED PROCEDURE SPINE  2013    rfa to c3 to c 7     TOE SURGERY Right 02/09/2019    toe surgery  Right 01/12/2019    Dr Ronnie Mason         Medications:  Outpatient Medications Prior to Visit   Medication Sig Dispense Refill    Acetaminophen 500 MG Oral Tab Take 2 tablets (1,000 mg) by mouth 2 times a day.      Alpha-Lipoic Acid 300 MG tablet Take 1 tablet by mouth daily.      amLODIPine 5 MG tablet  Take 1 tablet (5 mg) by mouth 2 times a day. 180 tablet 3    atorvastatin 10 MG tablet Take 1 tablet (10 mg) by mouth daily. 90 tablet 3    BENFOTIAMINE OR Take 250 mg by mouth daily.      betamethasone dipropionate 0.05 % lotion Apply topically 2 times a day. Apply to scalp and ears as needed for itching 60 mL 11    Cholecalciferol (VITAMIN D3) 2000 units Oral Cap Take 1 capsule (2,000 Units) by mouth daily. For low level 1 capsule 1    CLINDAMYCIN HCL OR Take by mouth. CLINDAMYCIN CREAM      Cyanocobalamin 1000 MCG Oral Tab one per day over-the-counter started 5/21 /2012 this level borderline low 1 Tab 1    diclofenac 1 % gel Apply 4 g topically 4 times a day. Apply to 2 gram four times daily to neck, trapezius muscle, and low back. Use a max of 32 grams a day. 300 g 4    DULoxetine 60 MG DR capsule Take 1 capsule (60 mg) by mouth 2 times a day. 180 capsule 2    EPINEPHrine 0.3 MG/0.3ML auto-injector INJECT INTRAMUSCULARLY ONCE AS NEEDED FOR SEVERE ALLERGIC REACTION 4 each 3    ibuprofen 200 MG tablet Take 1 tablet (200 mg) by mouth daily as needed (pain).      lidocaine 5 % patch Apply 1 patch onto the skin daily. Apply to painful area for up to 12 hours in a 24 hour period. (Patient taking differently: Apply 1 patch onto the skin daily as needed. Apply to  painful area for up to 12 hours in a 24 hour period.) 30 patch 5    lisinopril 20 MG tablet Take 1 tablet (20 mg) by mouth every 12 hours. Please complete labs 180 tablet 0    naltrexone 50 MG tablet take 1 tablet by mouth once daily with food 30 tablet 1    Omega-3 Fatty Acids (OMEGA 3 OR) Take 1 tablet by mouth daily.      pregabalin 300 MG capsule Take 1 capsule (300 mg) by mouth 2 times a day. 180 capsule 1    primidone 50 MG tablet Take 1 tablet (50 mg) by mouth 2 times a day. 180 tablet 4     No facility-administered medications prior to visit.        Allergies:  Review of patient's allergies indicates:  Allergies   Allergen Reactions    Adhesives Skin: Rash     Glue on lido patches    Bee Venom Skin: Hives, Skin: Itching and Swelling    Gabapentin Other     Flu like symptoms, diarrhea, body ache upset stomach    Methocarbamol Other     Patient's wife reports cognitive difficulties ("goofy")        Family/Social history:  family history includes Colon Cancer in his father; Heart (other) in his mother; Other Family Hx in an other family member. There is no history of Cancer.       OBJECTIVE:  Vitals:  There were no vitals taken for this visit.  Pulse: Slightly slippery.  Tongue: Deferred. Patient wearing a face covering.      Assessment/Plan:    Following an evaluation including the history of present illness/s, medical history and examination, the patient's clinical presentation suggest probable:    (M79.18) Myofascial pain    (M54.12) Cervical radiculopathy, chronic    (M54.50,  G89.29) Chronic bilateral low back pain without sciatica    (  G89.4) Chronic pain syndrome      Diagnosis According to [place] Medicine:  Qi stagnation and Blood stasis in UB Channel     Treatment Principle/Goals:   Short-term: reduce muscle hypertonicity, decrease discomfort/pain, improve circulation and increase range of motion  Long-term: avoid the development of chronic pain syndrome and minimize probability of future  exacerbations.    First 15 Minutes: Supine   (BL) YT,  R: SI3, [address] (2 points),  [employer] (2 points), Debria Garret, [name]  L:  OQ94, SJ14, KD3,   Bridgett Larsson Shui appiled on Left shoulder (soked in tissue) for 10 mins    Needles in: 11  Needles in: 11    Second 8 Minutes: Seating position  Mid: Du14 (just tap in), HTJJ - C7 (just tap in)  BL: GB20, Du14  R: ST38, San Jian,   L: GB12, Cervical and Point Zero, SI11,       Needles in: 9  Needles out: 9    Pyonex (0.86mm) were placed at L - LI10 Allayne Gitelman, [place],  [phone]. I advised to keep them upto 5 days and take them off. However, if there is any irritability or discomfort felt, then remove them sooner. I also advised to discard the used one safely.      I noted Chaynce that he has small open wound looks red on his left back (near the center), and advised him to monitor it and apply ointment to avoid infection.     Post Treatment Note:   Marquarius Lofton tolerated the all the treatment well without any complications.      Time Spent:   - Time spent for face-to-face for evaluation and management: 5 minutes.   - Time spent for face-to-face for acupuncture: 23 minutes.  Total time spent for patient's care, including chart review, relevant lab/imaging review, and creation of care plan: 55 minutes.      Augusto Gamble, AEMP, DACM, LAc  Licensed Acupuncturist, Paramedic for [employer]  Department of Family medicine  [place] [facility] [place]

## 2022-03-15 ENCOUNTER — Other Ambulatory Visit (HOSPITAL_BASED_OUTPATIENT_CLINIC_OR_DEPARTMENT_OTHER): Payer: Self-pay | Admitting: Psychiatry

## 2022-03-15 DIAGNOSIS — F1020 Alcohol dependence, uncomplicated: Secondary | ICD-10-CM

## 2022-03-16 MED ORDER — NALTREXONE HCL 50 MG OR TABS
ORAL_TABLET | ORAL | 0 refills | Status: DC
Start: 2022-03-16 — End: 2022-04-13

## 2022-03-18 NOTE — Progress Notes (Signed)
[facility] Follow Up Visit  03/19/2022   Ronnie Mason;  MRN: [id number];   DOB: 12/09/1950    Chief Complaint   Patient presents with    Neck Pain       History of Present Illness:  [name] was  last seen by Dr. Araceli Bouche for a follow-up.  From that visit:   "PLAN:      We discussed our impressions and the following suggestions/options in detail and  provided him with information in the after visit summary.  [name] had no further questions.     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 His last cervical ESI gave him ~50% relief. Continues to have cervical radicular pain radiating to LUE and has developed more paresthesias and numbness over the last 2-3 months. Discussed referral to neurosurgery for consideration of neuroforaminal decompression. Also complains of low back pain. Options include repeat MBB vs repeat lumbar TPIs w/ depomedrol vs repeat lumbar ESI. Discussed prioritizing steroids for repeat cervical ESI for the time being given the amount of relief it gave him. Also discussed augmenting with acupuncture as an adjunct.     Medication   No changes       Rehabilitation  Continue physical therapy, kinesiotaping, TENS and sonophoresis  We discussed a physical therapy approach focused on gradual, progressive stretching and strengthening and developing an independent home exercise program      Mental health  Consider mindfulness meditation app (e.g. "Calm" app)     Complimentary and alternative medicine  Consider trial of OTC Magnesium (e.g. tricomplex formulation: Mg L-threonate, Mg Glycinate, and Mg Taurate)    Start acupuncture, referral placed to Dr. Manuela Neptune Majd       Interventional treatments  Repeat cervical epidural steroid injection to see if can improve left C6 radicular symptoms further.              Patient has not seen surgeon previously for his neck symptoms--would be worthwhile to refer for evaluation to know options if symptoms do not continue to  improve following epidural  Repeat lumbar TPIs w/ depomedrol to left lower back. Discussed with patient limiting steroid by spacing injections to reduce chance of side effects.      Coordination of care  Ronnie Mason is recommended to follow up with his PCP, Macario Carls, MD and other involved health care providers to coordinate the above care plan"       He is here today for a follow up visit.      Issues that Ronnie Mason would like to address today include:  neck apin    Interval/new treatment and medical events (including diagnostic testing) since his last visit includes:   New treatment has not been helpful.      The pain is located primarily in the neck and is constant.  Other pain locations include back.  He describes his neck pain as dull and left arm pain is burning. His other back pains are described as aching.  He feels the overall pain is  gradually worsening.  The pain is made better by acupuncture and PT.  The pain is made worse by turning head, driving  The impacts of the pain include taking breaks a lot. doesn't wake me up from sleep.    Hasn't done tpi for neck. Only for back and reports 3 days of relief 01/2022  Cervical epidural 2/2 had 7 days of relief in 01/2022. Most relief was in the  left arm.    Current medications related to pain and related conditions include:  Lyrica 300 mg bid  Duloxetine 60 mg po qd  Ibuprofen  Tylenol  Lidocine patch    Other current/recent treatments include:  Acupuncture and PT for neck    Problem list:   Patient Active Problem List   Diagnosis    Syncope    Hypotension    Carotid Sinus Hypersensitivity    URI (upper respiratory infection)    Cervicalgia    Hyperlipidemia    Chronic pain    Bee sting allergy    Numbness and tingling of leg    Chronic back pain    B12 deficiency    Chronic renal insufficiency, stage III (moderate) (HCC)    Back pain, lumbosacral    Lumbar radiculopathy, chronic    Neck pain    Fall    Tremor    Sensory neuropathy    Spondylosis of cervical  region without myelopathy or radiculopathy    Idiopathic peripheral neuropathy    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)    History of alcohol dependence (HCC)    Cerebral ventriculomegaly    Cerebellar hypoplasia (HCC)    Essential hypertension    Essential tremor    Hx of spontan intraparenchymal intracran bleed assoc with hypertension    Alcohol abuse    Intraparenchymal hemorrhage of brain (HCC)    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)    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)    Balance problem    Chronic pain of both shoulders    Vitamin D deficiency    Difficulty in walking, not elsewhere classified    Impaired mobility and ADLs    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(HCC)    Primary osteoarthritis of right hip    Mild vascular neurocognitive disorder    Right lumbar radiculitis    Greater trochanteric pain syndrome of right lower extremity    Complex tear of medial meniscus of left knee as current injury    Primary osteoarthritis of left knee    Acute left-sided low back pain without sciatica    Chronic left-sided low back pain without sciatica    Enuresis    Closed fracture of one rib of right side    MRSA (methicillin resistant staph aureus) culture positive    Myofascial pain    Macrocytosis without anaemia    Cellulitis and abscess of foot    Psychological factors affecting medical condition    Chronic bilateral low back pain without sciatica         Past Medical History:   Diagnosis Date    Pain of right hip joint 01/23/2016    Intraparenchymal hemorrhage of brain (HCC) 2017    Spondylosis of cervical region without myelopathy or radiculopathy 05/28/2014    Allergic rhinitis due to allergen     Carotid Sinus Hypersensitivity     Disc disorder of lumbosacral region     Dizziness     Heart murmur     Hip injury     HYPERTENSION      HYPOTENSION      Nosebleed     Strep throat     Stroke [facility])     Syncope     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(HCC)     URI (upper respiratory infection)       Past Surgical History:   Procedure Laterality Date    L meniciscus Left 07/2017    by Dr Marcelle Overlie , re  did left menisicus     PR ANES; COLONOSCOPY  2002    repeat in 3 years    PR ANES; COLONOSCOPY & POLYPECTOMY  11/18/2007    repeat in 3 years    PR HALLUX RIGIDUS W/CHEILECTOMY 1ST MP JT W/O IMPLT Right 10/21/2017    Dr. Donn Pierini    PR UNLISTED PROCEDURE FEMUR/KNEE      PR UNLISTED PROCEDURE HANDS/FINGERS      PR UNLISTED PROCEDURE SPINE  2013    rfa to c3 to c 7     TOE SURGERY Right 02/09/2019    toe surgery  Right 01/12/2019    Dr Hilarie Fredrickson      Family History       Problem (# of Occurrences) Relation (Name,Age of Onset)    Heart (other) (1) Mother: MI    Other Family Hx (1) Other: myelodysplasia    Colon Cancer (1) Father           Negative family history of: Cancer          Social History     Tobacco Use    Smoking status: Never    Smokeless tobacco: Current     Types: Chew    Tobacco comments:     Sometimes chews, but is trying to quit. 11/28/21   Substance Use Topics    Alcohol use: Yes     Alcohol/week: 7.0 standard drinks     Types: 7 Standard drinks or equivalent per week     Comment: goal to get down to 2 per day     Past medical, surgical, family, and social history was  reviewed and updated personally with Ronnie Mason.  He reports no other change in past medical history, past surgical history, family history, or social history since his  last visit except as noted above.  Medications and allergies were also reviewed and confirmed personally.  Current Outpatient Medications   Medication Sig Dispense Refill    Acetaminophen 500 MG Oral Tab Take 2 tablets (1,000 mg) by mouth 2 times a day.      Alpha-Lipoic Acid 300 MG tablet Take 1 tablet by mouth daily.      amLODIPine 5 MG tablet Take 1 tablet (5 mg) by mouth 2 times a day. 180 tablet 3    atorvastatin 10 MG tablet Take 1 tablet (10 mg) by mouth daily. 90 tablet 3    BENFOTIAMINE OR Take 250 mg by mouth daily.      betamethasone dipropionate 0.05 %  lotion Apply topically 2 times a day. Apply to scalp and ears as needed for itching 60 mL 11    Cholecalciferol (VITAMIN D3) 2000 units Oral Cap Take 1 capsule (2,000 Units) by mouth daily. For low level 1 capsule 1    CLINDAMYCIN HCL OR Take by mouth. CLINDAMYCIN CREAM      Cyanocobalamin 1000 MCG Oral Tab one per day over-the-counter started 5/21 /2012 this level borderline low 1 Tab 1    diclofenac 1 % gel Apply 4 g topically 4 times a day. Apply to 2 gram four times daily to neck, trapezius muscle, and low back. Use a max of 32 grams a day. 300 g 4    DULoxetine 60 MG DR capsule Take 1 capsule (60 mg) by mouth 2 times a day. 180 capsule 2    EPINEPHrine 0.3 MG/0.3ML auto-injector INJECT INTRAMUSCULARLY ONCE AS NEEDED FOR SEVERE ALLERGIC REACTION 4 each 3    ibuprofen 200 MG tablet  Take 1 tablet (200 mg) by mouth daily as needed (pain).      lidocaine 5 % patch Apply 1 patch onto the skin daily. Apply to painful area for up to 12 hours in a 24 hour period. (Patient taking differently: Apply 1 patch onto the skin daily as needed. Apply to painful area for up to 12 hours in a 24 hour period.) 30 patch 5    lisinopril 20 MG tablet Take 1 tablet (20 mg) by mouth every 12 hours. Please complete labs 180 tablet 0    naltrexone 50 MG tablet TAKE 1 TABLET BY MOUTH DAILY WITH FOOD 30 tablet 0    Omega-3 Fatty Acids (OMEGA 3 OR) Take 1 tablet by mouth daily.      pregabalin 300 MG capsule Take 1 capsule (300 mg) by mouth 2 times a day. 180 capsule 1    primidone 50 MG tablet Take 1 tablet (50 mg) by mouth 2 times a day. 180 tablet 4     No current facility-administered medications for this visit.       Review Of Systems  The Review of Systems was provided by Ronnie Mason and entered in the [place] note associated with this encounter.  I did personally reviewed and confirmed the ROS information with Ronnie Mason and have no further detail to add.    Physical Examination: BP (!) 141/87   Pulse 84   Temp (!) 35.7 C (Temporal)   Ht  6' 3.5" (1.918 m)   Wt (!) 102.1 kg (225 lb) Comment: per patient  SpO2 94%   BMI 27.75 kg/m    General: healthy, alert, no distress  Psychiatric  Judgement/insight: Normal;  Orientation: Normal..Oriented , interactive.   Spine:  The spine is straight, nontender, without kyphosis or scoliosis.  Good flexion. Pain on extension, limited rom on extension.  Limited ROM on left gaze. Tenderness in paracervical area.  Gait: normal.  MSK: limited rom of left shoulder past 90 degrees 2/2 pain in neck. 5/5 grip strength bilaterally  Neuro: no parasthesias in forearm and hand. Decreased sensation to pinprick in upper left arm only.     Assessment/Discussion  (M54.12) Cervical radiculopathy, chronic  (primary encounter diagnosis)    Ronnie Mason is a 71 year old male with  cervical stenosis with left radiculopathy, multilevel neural foraminal stenosis, multilevel lumbar spondylosis wanting neurosurgery evaluation for cervical radiculopathy. Has failed conservative therapy. Has had 2 cervicothoracic epidural injections with 1 week of relief. On exam, he has myofascial tenderness. We recommend cervical TPI at this time. If still have continued similar focal pain in right lower neck after TPI then we can consider cervical facet medial branch block in hopes that this avoids surgical options. Patient would like to meet with neurosurgeon in regards to cervical spine as he would like a "permanent fix." But also open to exploring options.      Plan:    Referred patient to neurosurgery to discuss options  2. Referral to weight loss clinic as patient would like to consider medication assisted weight loss which may help with back pain  3. Medications to discuss with his PCP, Macario Carls, MD and/or other involved providers:   None at this time  4. Follow-up: for trigger point injection      Steva Colder, MD

## 2022-03-19 ENCOUNTER — Encounter (HOSPITAL_BASED_OUTPATIENT_CLINIC_OR_DEPARTMENT_OTHER): Payer: Self-pay | Admitting: Anesthesiology

## 2022-03-19 ENCOUNTER — Ambulatory Visit: Payer: Medicare PPO | Attending: Anesthesiology | Admitting: Anesthesiology

## 2022-03-19 ENCOUNTER — Ambulatory Visit (INDEPENDENT_AMBULATORY_CARE_PROVIDER_SITE_OTHER): Payer: Medicare PPO | Admitting: Acupuncturist

## 2022-03-19 VITALS — BP 141/87 | HR 84 | Temp 96.3°F | Ht 75.5 in | Wt 225.0 lb

## 2022-03-19 DIAGNOSIS — M4802 Spinal stenosis, cervical region: Secondary | ICD-10-CM | POA: Insufficient documentation

## 2022-03-19 DIAGNOSIS — R635 Abnormal weight gain: Secondary | ICD-10-CM | POA: Insufficient documentation

## 2022-03-19 DIAGNOSIS — M47812 Spondylosis without myelopathy or radiculopathy, cervical region: Secondary | ICD-10-CM | POA: Insufficient documentation

## 2022-03-19 DIAGNOSIS — M5412 Radiculopathy, cervical region: Secondary | ICD-10-CM | POA: Insufficient documentation

## 2022-03-19 DIAGNOSIS — M7918 Myalgia, other site: Secondary | ICD-10-CM | POA: Insufficient documentation

## 2022-03-19 NOTE — Progress Notes (Signed)
Review of Systems   Constitutional: Negative.    HENT: Negative.     Eyes: Negative.    Respiratory: Negative.     Cardiovascular: Negative.    Gastrointestinal: Negative.    Endocrine: Negative.    Genitourinary: Negative.    Musculoskeletal:  Positive for back pain, gait problem, neck pain and neck stiffness.   Skin: Negative.    Allergic/Immunologic: Negative.    Neurological:  Positive for numbness.   Hematological: Negative.    Psychiatric/Behavioral: Negative.

## 2022-03-19 NOTE — Progress Notes (Signed)
I saw and evaluated the patient with Dr. Mirian Mo, who conducted the initial history.  I reviewed and confirmed the history in detail with Mr. Scholz in person.   I was present for the examination and formulation portions of the encounter. I agree with the findings and the plan of care as documented in our notes. I did edit the trainee's note.    I spent a total of 31 minutes for the patient's care on the date of the service.            [name], MD  Medical Director   [facility]  [facility]   Department of Anesthesiology and Pain Medicine

## 2022-03-19 NOTE — Patient Instructions (Signed)
Nice to see you today.    We have placed referral for neurosurgery and weight loss clinic.    We recommend cervical trigger point injections at this time. We can consider another procedure targeting cervical facet nerve branches depending on response to trigger point injections.

## 2022-03-24 ENCOUNTER — Other Ambulatory Visit (INDEPENDENT_AMBULATORY_CARE_PROVIDER_SITE_OTHER): Payer: Medicare PPO

## 2022-03-24 ENCOUNTER — Ambulatory Visit: Payer: Medicare PPO | Attending: Internal Medicine

## 2022-03-24 ENCOUNTER — Other Ambulatory Visit (INDEPENDENT_AMBULATORY_CARE_PROVIDER_SITE_OTHER): Payer: Self-pay | Admitting: Internal Medicine

## 2022-03-24 DIAGNOSIS — E785 Hyperlipidemia, unspecified: Secondary | ICD-10-CM

## 2022-03-24 DIAGNOSIS — R419 Unspecified symptoms and signs involving cognitive functions and awareness: Secondary | ICD-10-CM

## 2022-03-24 DIAGNOSIS — M792 Neuralgia and neuritis, unspecified: Secondary | ICD-10-CM

## 2022-03-24 DIAGNOSIS — I10 Essential (primary) hypertension: Secondary | ICD-10-CM

## 2022-03-24 LAB — CBC, DIFF
% Basophils: 1 %
% Eosinophils: 2 %
% Immature Granulocytes: 0 %
% Lymphocytes: 21 %
% Monocytes: 10 %
% Neutrophils: 66 %
% Nucleated RBC: 0 %
Absolute Eosinophil Count: 0.12 10*3/uL (ref 0.00–0.50)
Absolute Lymphocyte Count: 1.1 10*3/uL (ref 1.00–4.80)
Basophils: 0.05 10*3/uL (ref 0.00–0.20)
Hematocrit: 47 % (ref 38.0–50.0)
Hemoglobin: 15.4 g/dL (ref 13.0–18.0)
Immature Granulocytes: 0.02 10*3/uL (ref 0.00–0.05)
MCH: 34.1 pg — ABNORMAL HIGH (ref 27.3–33.6)
MCHC: 32.8 g/dL (ref 32.2–36.5)
MCV: 104 fL — ABNORMAL HIGH (ref 81–98)
Monocytes: 0.54 10*3/uL (ref 0.00–0.80)
Neutrophils: 3.38 10*3/uL (ref 1.80–7.00)
Nucleated RBC: 0 10*3/uL
Platelet Count: 161 10*3/uL (ref 150–400)
RBC: 4.51 10*6/uL (ref 4.40–5.60)
RDW-CV: 13.4 % (ref 11.0–14.5)
WBC: 5.21 10*3/uL (ref 4.3–10.0)

## 2022-03-24 LAB — T3 (FREE): T3 (Free): 3.5 pg/mL (ref 2.4–4.1)

## 2022-03-24 LAB — LIPID PANEL
Cholesterol/HDL Ratio: 1.8
HDL Cholesterol: 139 mg/dL (ref >39)
LDL Cholesterol, NIH Equation: 91 mg/dL (ref <130)
Non-HDL Cholesterol: 105 mg/dL (ref 0–159)
Total Cholesterol: 244 mg/dL — ABNORMAL HIGH (ref <200)
Triglyceride: 87 mg/dL (ref <150)

## 2022-03-24 LAB — COMPREHENSIVE METABOLIC PANEL
ALT (GPT): 32 U/L (ref 10–48)
AST (GOT): 52 U/L — ABNORMAL HIGH (ref 9–38)
Albumin: 4.4 g/dL (ref 3.5–5.2)
Alkaline Phosphatase (Total): 76 U/L (ref 36–161)
Anion Gap: 12 (ref 4–12)
Bilirubin (Total): 0.6 mg/dL (ref 0.2–1.3)
Calcium: 9.3 mg/dL (ref 8.9–10.2)
Carbon Dioxide, Total: 27 meq/L (ref 22–32)
Chloride: 100 meq/L (ref 98–108)
Creatinine: 0.96 mg/dL (ref 0.51–1.18)
Glucose: 92 mg/dL (ref 62–125)
Potassium: 4.7 meq/L (ref 3.6–5.2)
Protein (Total): 6.9 g/dL (ref 6.0–8.2)
Sodium: 139 meq/L (ref 135–145)
Urea Nitrogen: 22 mg/dL — ABNORMAL HIGH (ref 8–21)
eGFR by CKD-EPI 2021: >60 mL/min/{1.73_m2} (ref >59)

## 2022-03-24 LAB — THYROID STIMULATING HORMONE: Thyroid Stimulating Hormone: 1.161 u[IU]/mL (ref 0.400–5.000)

## 2022-03-24 LAB — FOLATE & VITAMIN B12
Folate, SRM: >22 ng/mL (ref >5.8)
Vitamin B12 (Cobalamin): 694 pg/mL (ref 180–914)

## 2022-03-24 LAB — T4, FREE: Thyroxine (Free): 0.7 ng/dL (ref 0.6–1.2)

## 2022-03-24 NOTE — Result Encounter Note (Signed)
Your liver enzyme is elevated and this can be a product of drinking alcohol along with elevated MCV count  Thyroid lab is fine    Please think about not drinking and we can repeat this again in about a month if you wish  Or at one of our visits if this continues I would recommend you see hepatology as it can be a risk to liver failure

## 2022-03-24 NOTE — Progress Notes (Signed)
Urine temperature 96 degrees

## 2022-03-25 ENCOUNTER — Other Ambulatory Visit (INDEPENDENT_AMBULATORY_CARE_PROVIDER_SITE_OTHER): Payer: Self-pay | Admitting: Internal Medicine

## 2022-03-25 ENCOUNTER — Encounter (HOSPITAL_BASED_OUTPATIENT_CLINIC_OR_DEPARTMENT_OTHER): Payer: Self-pay

## 2022-03-25 DIAGNOSIS — M5417 Radiculopathy, lumbosacral region: Secondary | ICD-10-CM

## 2022-03-25 DIAGNOSIS — M542 Cervicalgia: Secondary | ICD-10-CM

## 2022-03-25 LAB — CHR PAIN DRUG RSK1 W/CNSLT, URN
Amphet/Methamphetamine Qual,URN: NEGATIVE
Barbiturate (Qual), URN: POSITIVE — AB
Benzodiazepines (Qual), URN: NEGATIVE
Buprenorphine Qualitatiave, URN: NEGATIVE
Cannabinoids (Qual), URN: NEGATIVE
Cocaine (Qual), URN: NEGATIVE
Fentanyl Qual, URN: NEGATIVE
Methadone (Qual), URN: NEGATIVE
Opiates (Qual), URN: NEGATIVE
Oxycodone (Qual), URN: NEGATIVE
Tramadol Qual, URN: NEGATIVE

## 2022-03-26 NOTE — Result Encounter Note (Signed)
Lab test consistent

## 2022-03-27 ENCOUNTER — Ambulatory Visit (INDEPENDENT_AMBULATORY_CARE_PROVIDER_SITE_OTHER): Payer: Medicare PPO | Admitting: Acupuncturist

## 2022-03-27 DIAGNOSIS — M545 Low back pain, unspecified: Secondary | ICD-10-CM

## 2022-03-27 DIAGNOSIS — M542 Cervicalgia: Secondary | ICD-10-CM

## 2022-03-27 MED ORDER — LIDOCAINE 5 % EX PTCH
1.0000 | MEDICATED_PATCH | Freq: Every day | CUTANEOUS | 2 refills | Status: DC
Start: 2022-03-27 — End: 2023-03-17

## 2022-03-27 NOTE — Progress Notes (Signed)
[place] [facility]    Acupuncture Treatment: 6    Referred by: Drexel Iha, MD     PCP: [name], MD    HPI: Ronnie Mason was referred to acupuncture and [place] medicine for consultation and treatment of    Chief Complaint   Patient presents with    Acupuncture     Cervical radiculopathy, bilateral low back pain without sciatica, chronic pain       CC2: Chronic Back Pain   - Lower back pain has been better and feeling less lately  - Pain scale today  2-3/10 at lower back  - Experiencing tingling and pins and needles at his feet & noted it is coming from back  - This weekend expected to do more yard work on his properties, and wondering how it goes with the lower back.     Related issues   Neck and shoulder pain  - Left shoulder and neck are sore  - Had acute right neck & shoulder pain. PT visit and taping has helped.     - Neck feeling tight  - Today's pain scale: 2-3/10 at this moment. Usually it gets worse as day goes  - Limited ROM: 80 degree of Abduction  - Feeling tight and sore  - Going to have trigger point Injections next week    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    Allergic rhinitis due to allergen     Carotid Sinus Hypersensitivity     Disc disorder of lumbosacral region     Dizziness     Heart murmur     Hip injury     HYPERTENSION      HYPOTENSION      Nosebleed     Strep throat     Stroke [facility])     Syncope     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(HCC)     URI (upper respiratory infection)         Surgical History:  Past Surgical History:   Procedure Laterality Date    L meniciscus Left 07/2017    by Dr Marcelle Overlie , re did left menisicus     PR ANES; COLONOSCOPY  2002    repeat in 3 years    PR ANES; COLONOSCOPY & POLYPECTOMY  11/18/2007    repeat in 3 years     PR HALLUX RIGIDUS W/CHEILECTOMY 1ST MP JT W/O IMPLT Right 10/21/2017    Dr. Donn Pierini    PR UNLISTED PROCEDURE FEMUR/KNEE      PR UNLISTED PROCEDURE HANDS/FINGERS      PR UNLISTED PROCEDURE SPINE  2013    rfa to c3 to c 7     TOE SURGERY Right 02/09/2019    toe surgery  Right 01/12/2019    Dr Hilarie Fredrickson         Medications:  Outpatient Medications Prior to Visit   Medication Sig Dispense Refill    Acetaminophen 500 MG Oral Tab Take 2 tablets (1,000 mg) by mouth 2 times a day.      Alpha-Lipoic Acid 300 MG tablet Take 1 tablet by mouth daily.      amLODIPine 5 MG tablet Take 1 tablet (5 mg) by mouth 2 times a day. 180 tablet 3  atorvastatin 10 MG tablet Take 1 tablet (10 mg) by mouth daily. 90 tablet 3    BENFOTIAMINE OR Take 250 mg by mouth daily.      betamethasone dipropionate 0.05 % lotion Apply topically 2 times a day. Apply to scalp and ears as needed for itching 60 mL 11    Cholecalciferol (VITAMIN D3) 2000 units Oral Cap Take 1 capsule (2,000 Units) by mouth daily. For low level 1 capsule 1    CLINDAMYCIN HCL OR Take by mouth. CLINDAMYCIN CREAM      Cyanocobalamin 1000 MCG Oral Tab one per day over-the-counter started 5/21 /2012 this level borderline low 1 Tab 1    diclofenac 1 % gel Apply 4 g topically 4 times a day. Apply to 2 gram four times daily to neck, trapezius muscle, and low back. Use a max of 32 grams a day. 300 g 4    DULoxetine 60 MG DR capsule Take 1 capsule (60 mg) by mouth 2 times a day. 180 capsule 2    EPINEPHrine 0.3 MG/0.3ML auto-injector INJECT INTRAMUSCULARLY ONCE AS NEEDED FOR SEVERE ALLERGIC REACTION 4 each 3    ibuprofen 200 MG tablet Take 1 tablet (200 mg) by mouth daily as needed (pain).      lidocaine 5 % patch Apply 1 patch onto the skin daily. Apply to painful area for up to 12 hours in a 24 hour period. (Patient taking differently: Apply 1 patch onto the skin daily as needed. Apply to painful area for up to 12 hours in a 24 hour period.) 30 patch 5    lisinopril 20 MG tablet  Take 1 tablet (20 mg) by mouth every 12 hours. Please complete labs 180 tablet 0    naltrexone 50 MG tablet TAKE 1 TABLET BY MOUTH DAILY WITH FOOD 30 tablet 0    Omega-3 Fatty Acids (OMEGA 3 OR) Take 1 tablet by mouth daily.      pregabalin 300 MG capsule Take 1 capsule (300 mg) by mouth 2 times a day. 180 capsule 1    primidone 50 MG tablet Take 1 tablet (50 mg) by mouth 2 times a day. 180 tablet 4     No facility-administered medications prior to visit.        Allergies:  Review of patient's allergies indicates:  Allergies   Allergen Reactions    Adhesives Skin: Rash     Glue on lido patches    Bee Venom Skin: Hives, Skin: Itching and Swelling    Gabapentin Other     Flu like symptoms, diarrhea, body ache upset stomach    Methocarbamol Other     Patient's wife reports cognitive difficulties ("goofy")        Family/Social history:  family history includes Colon Cancer in his father; Heart (other) in his mother; Other Family Hx in an other family member. There is no history of Cancer.       OBJECTIVE:  Vitals:  There were no vitals taken for this visit.  Pulse: Slightly slippery.  Tongue: Deferred. Patient wearing a face covering.      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    First 15 Minutes: Supine   (BL) YT,  R: [address] (1 points),  [employer] (2 points), Debria Garret, SP9  L:  Shoulder LI15, [phone], Lorayne Bender, Arbie Cookey  Shui appiled on Left shoulder (soked in tissue) for 10 mins    Needles in: 11  Needles in: 11    Second 8 Minutes: Supine   BL: GB31. GB39    Needles in: 4  Needles out: 4    Pyonex (0.2mm) were placed at L - LI10 Allayne Gitelman, [place],  [phone]. I advised to keep them upto 5 days and take them off. However, if there is any irritability or discomfort felt, then remove them sooner. I also advised to discard the used one safely.      Post Treatment Note:   Jaquane Boughner tolerated the all the treatment well without any complications.      Time Spent:   - Time spent for face-to-face for evaluation and management: 5 minutes.   - Time spent for face-to-face for acupuncture: 23 minutes.  Total time spent for patient's care, including chart review, relevant lab/imaging review, and creation of care plan: 50 minutes.      Augusto Gamble, AEMP, DACM, LAc  Licensed Acupuncturist, Paramedic for [employer]  Department of Family medicine  [place] [facility] [place]

## 2022-04-03 ENCOUNTER — Ambulatory Visit (INDEPENDENT_AMBULATORY_CARE_PROVIDER_SITE_OTHER): Payer: Medicare PPO | Admitting: Acupuncturist

## 2022-04-03 DIAGNOSIS — M545 Low back pain, unspecified: Secondary | ICD-10-CM

## 2022-04-03 NOTE — Progress Notes (Signed)
[place] [facility]    Acupuncture Treatment: 7 for Lower Back Pain treatment    Referred by: Drexel Iha, MD     PCP: [name], MD    HPI: Ronnie Mason was referred to acupuncture and [place] medicine for consultation and treatment of    Chief Complaint   Patient presents with    Acupuncture     Chronic bilateral low back pain without sciatica, cervicalgia     Progress note: Ronnie Mason noted that acupuncture treatments have been helpful to reduce his lower back pain and prevent the aggravation of the pain.   Lately, his pain scale has been lower 2-3/10 compared to beginning of the acupuncture treatment.      CC2: Chronic Back Pain   - Lower back pain has been better and feeling less lately  - Pain scale today  2-3/10 at lower back  - Experiencing tingling and pins and needles at his feet & noted it is coming from back  - The last treatment was help to reduce the lower back pain & no aggravation since the last treatment  - PT has been helpful to reduce the pain. Working on the strengthen the core.   - Right foot balance has been difficult.     Related issues   Neck and shoulder pain  - Left shoulder is less sore and ROM has improved - able to do the flexion and abduction more than 120 degree.   - Had acute right neck & shoulder pain. PT visit and taping has helped.     - Neck feeling less tight  - Today's pain scale: 3-4/10  at L, at this moment.   - Feeling tight and sore    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    Allergic rhinitis due to allergen     Carotid Sinus Hypersensitivity     Disc disorder of lumbosacral region     Dizziness     Heart murmur     Hip injury     HYPERTENSION      HYPOTENSION      Nosebleed     Strep throat     Stroke [facility])     Syncope      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(HCC)     URI (upper respiratory infection)         Surgical History:  Past Surgical History:   Procedure Laterality Date    L meniciscus Left 07/2017    by Dr Marcelle Overlie , re did left menisicus     PR ANES; COLONOSCOPY  2002    repeat in 3 years    PR ANES; COLONOSCOPY & POLYPECTOMY  11/18/2007    repeat in 3 years    PR HALLUX RIGIDUS W/CHEILECTOMY 1ST MP JT W/O IMPLT Right 10/21/2017    Dr. Donn Pierini    PR UNLISTED PROCEDURE FEMUR/KNEE      PR UNLISTED PROCEDURE HANDS/FINGERS      PR UNLISTED PROCEDURE SPINE  2013    rfa to c3 to c 7     TOE SURGERY Right 02/09/2019    toe surgery  Right 01/12/2019    Dr Hilarie Fredrickson         Medications:  Outpatient Medications Prior to Visit  Medication Sig Dispense Refill    Acetaminophen 500 MG Oral Tab Take 2 tablets (1,000 mg) by mouth 2 times a day.      Alpha-Lipoic Acid 300 MG tablet Take 1 tablet by mouth daily.      amLODIPine 5 MG tablet Take 1 tablet (5 mg) by mouth 2 times a day. 180 tablet 3    atorvastatin 10 MG tablet Take 1 tablet (10 mg) by mouth daily. 90 tablet 3    BENFOTIAMINE OR Take 250 mg by mouth daily.      betamethasone dipropionate 0.05 % lotion Apply topically 2 times a day. Apply to scalp and ears as needed for itching 60 mL 11    Cholecalciferol (VITAMIN D3) 2000 units Oral Cap Take 1 capsule (2,000 Units) by mouth daily. For low level 1 capsule 1    CLINDAMYCIN HCL OR Take by mouth. CLINDAMYCIN CREAM      Cyanocobalamin 1000 MCG Oral Tab one per day over-the-counter started 5/21 /2012 this level borderline low 1 Tab 1    diclofenac 1 % gel Apply 4 g topically 4 times a day. Apply to 2 gram four times daily to neck, trapezius muscle, and low back. Use a max of 32 grams a day. 300 g 4    DULoxetine 60 MG DR capsule Take 1 capsule (60 mg) by mouth 2 times a day. 180 capsule 2    EPINEPHrine 0.3 MG/0.3ML auto-injector INJECT INTRAMUSCULARLY ONCE AS NEEDED FOR SEVERE ALLERGIC REACTION 4 each 3    ibuprofen 200 MG tablet Take 1  tablet (200 mg) by mouth daily as needed (pain).      lidocaine 5 % patch Apply 1 patch onto the skin daily. Apply to painful area for up to 12 hours in a 24 hour period. 30 patch 2    lisinopril 20 MG tablet Take 1 tablet (20 mg) by mouth every 12 hours. Please complete labs 180 tablet 0    naltrexone 50 MG tablet TAKE 1 TABLET BY MOUTH DAILY WITH FOOD 30 tablet 0    Omega-3 Fatty Acids (OMEGA 3 OR) Take 1 tablet by mouth daily.      pregabalin 300 MG capsule Take 1 capsule (300 mg) by mouth 2 times a day. 180 capsule 1    primidone 50 MG tablet Take 1 tablet (50 mg) by mouth 2 times a day. 180 tablet 4     No facility-administered medications prior to visit.        Allergies:  Review of patient's allergies indicates:  Allergies   Allergen Reactions    Adhesives Skin: Rash     Glue on lido patches    Bee Venom Skin: Hives, Skin: Itching and Swelling    Gabapentin Other     Flu like symptoms, diarrhea, body ache upset stomach    Methocarbamol Other     Patient's wife reports cognitive difficulties ("goofy")        Family/Social history:  family history includes Colon Cancer in his father; Heart (other) in his mother; Other Family Hx in an other family member. There is no history of Cancer.       OBJECTIVE:  Vitals:  There were no vitals taken for this visit.  Pulse: Slightly slippery.  Tongue: center crack with black coating in the middle. Red small tongue body.       Assessment/Plan:    Following an evaluation including the history of present illness/s, medical history and examination, the patient's clinical presentation suggest probable:    (  M54.50,  G89.29) Chronic bilateral low back pain, unspecified whether sciatica present  (primary encounter diagnosis)          Diagnosis According to [place] Medicine:  Qi stagnation and Blood stasis in UB Channel     Treatment Principle/Goals:   Short-term: reduce muscle hypertonicity, decrease discomfort/pain, improve circulation and increase range of motion  Long-term:  avoid the development of chronic pain syndrome and minimize probability of future exacerbations.    First 15 Minutes: Supine   (BL) YT,  R: [address] (2 points), [address]   L:  Shoulder LI15, SJ14, Lorayne Bender, Jerilynn Mages, [address], SP9  Bridgett Larsson Shui appiled on Left shoulder (soked in tissue) for 10 mins    Needles in: 11  Needles in: 11    Second 8 Minutes: Prone  BL: Du14, GB20, SI11, Yaoyan, UB40, UB60  Adelene Idler applied at lower back (soaked in the small piece of tissue paper)     Needles in: 11  Needles out: 11    Pyonex (0.7mm) were placed at [facility] bilaterally, L - SI11, Jianqian, SJ5. I advised to keep them upto 5 days and take them off. However, if there is any irritability or discomfort felt, then remove them sooner. I also advised to discard the used one safely.      Post Treatment Note:   Tennessee Perra tolerated the all the treatment well without any complications.      Time Spent:   - Time spent for face-to-face for evaluation and management: 5 minutes.   - Time spent for face-to-face for acupuncture: 23 minutes.    Leonardo Makris has shown improvement of the chief complaints and I believe that continuous acupuncture treatments would be beneficial for [name] & suggest another 4 sessions of treatment, and reassess at the 4th session.       Total time spent for patient's care, including chart review, relevant lab/imaging review, and creation of care plan: 50 minutes.  Augusto Gamble, AEMP, DACM, LAc  Licensed Acupuncturist, Paramedic for [employer]  Department of Family medicine  [place] [facility] [place]

## 2022-04-07 ENCOUNTER — Encounter (HOSPITAL_BASED_OUTPATIENT_CLINIC_OR_DEPARTMENT_OTHER): Payer: Self-pay | Admitting: Neurology

## 2022-04-07 ENCOUNTER — Ambulatory Visit: Payer: Medicare PPO | Attending: Neurology | Admitting: Neurology

## 2022-04-07 VITALS — BP 122/82 | HR 81 | Ht 76.0 in | Wt 225.0 lb

## 2022-04-07 DIAGNOSIS — M545 Low back pain, unspecified: Secondary | ICD-10-CM | POA: Insufficient documentation

## 2022-04-07 DIAGNOSIS — G9389 Other specified disorders of brain: Secondary | ICD-10-CM | POA: Insufficient documentation

## 2022-04-07 DIAGNOSIS — G609 Hereditary and idiopathic neuropathy, unspecified: Secondary | ICD-10-CM | POA: Insufficient documentation

## 2022-04-07 DIAGNOSIS — R269 Unspecified abnormalities of gait and mobility: Secondary | ICD-10-CM

## 2022-04-07 DIAGNOSIS — I6789 Other cerebrovascular disease: Secondary | ICD-10-CM

## 2022-04-07 DIAGNOSIS — N3946 Mixed incontinence: Secondary | ICD-10-CM | POA: Insufficient documentation

## 2022-04-07 NOTE — Progress Notes (Signed)
Note: Parts of this document have been created with voice recognition software. Although I have proofread the note, transcription errors may still exist.     Adult and Transitional Hydrocephalus and CSF Disorders    Reason for Visit:  Ronnie Mason is a 71 year old male who is referred for neurological consultation for hydrocephalus. He is accompanied by his wife.  They both provide the history. Additionally, I have reviewed approximately 20 pages of prior medical records.  His previous MRI and CT scans of the brain are not available for review.  I previously saw him in 2017 and 2018, but because more than 3 years has gone by, this is considered a new patient visit.    History of Present Illness: The first visit with me was in July 2017.  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  He had also had a mild to moderate traumatic brain injury secondary to a fall.  Brain imaging had shown enlargement of the ventricles.  At that time, he had longstanding issues with his gait.  He was having trouble with his thinking and memory that, over the course of the next year was shown to improve.  He also had neuropsychological testing by Dr. Evorn Gong that concluded that most of the difficulty seen were likely due to the thalamic hemorrhage.  He has not seen me since 2018 and they are now back because of his wife's concerns for his thinking and memory.    Gait: He says that the main trouble with his walking and balance is due to the combination of low back pain, right foot pain and previous surgery, and longstanding neuropathy.  The back pain is not radiating.  He notes that if he is walking outside over uneven terrain, he has more difficulty.  He uses a cane outside the house at times, and inside the house at times.    Urinary Control: He has new bladder control issues since our last visit.  He has urgency and difficulty inhibiting bladder emptying at times  during the day.  He has the same at night, but also has a pattern of sometimes waking up wet (enuresis).  He was seen in urology in 2020 just before the start of the pandemic and has not had any follow-up.    Thinking and Memory: He and his wife have different perspectives on his memory.  His wife is concerned that he forgets things, that she needs to repeat statements to him, and that he misplaces things.  He feels that he does not have any difficulty.  Based on the responses to the [employer], he appears independent.    Educational and Employment History:  He has completed 16 years of education and his highest education level attained is Dietitian.  His current employment status is  retired for 13 years . His previous work experience includes work as an Arboriculturist.    Headache / Head Fullness: None    Previous Testing or Treatment: Only the previous MRI scans and neuropsychology testing    Review of risk factors and family history for hydrocephalus reveals traumatic brain injury.    Copies of the patient's complete medical history form and other documentation that I have reviewed, annotated, or created for this visit have been scanned into the electronic medical record.     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    Past Surgical History  He  has a past surgical history that includes pr unlisted procedure hands/fingers; pr unlisted procedure femur/knee; pr anes; colonoscopy (2002); pr anes; colonoscopy & polypectomy (11/18/2007); pr unlisted procedure spine (2013); L meniciscus (Left, 07/2017); pr hallux rigidus w/cheilectomy 1st mp jt w/o implt (Right, 10/21/2017); toe surgery  (Right, 01/12/2019); and Toe Surgery (Right, 02/09/2019).    Family History  His family history includes Colon Cancer in his father; Heart (other) in his mother; Other Family Hx in an other family member. There is no history of Cancer.    Social History  He  reports that he has never smoked. His smokeless tobacco use includes chew. He reports that he does not currently use alcohol after a past usage of about 7.0 standard drinks per week. He reports current drug use. Frequency: 3.00 times per week. Drug: Marijuana.    Allergies  Adhesives, Bee venom, Gabapentin, and Methocarbamol    Medications  Outpatient Medications Marked as Taking for the 04/07/22 encounter (Office Visit) with Tillman Abide, MD   Medication Sig Dispense Refill    Acetaminophen 500 MG Oral Tab Take 2 tablets (1,000 mg) by mouth 2 times a day.      Alpha-Lipoic Acid 300 MG tablet Take 1 tablet by mouth daily.      amLODIPine 5 MG tablet Take 1 tablet (5 mg) by mouth 2 times a day. 180 tablet 3    atorvastatin 10 MG tablet Take 1 tablet (10 mg) by mouth daily. 90 tablet 3    BENFOTIAMINE OR Take 250 mg by mouth daily.      betamethasone dipropionate 0.05 % lotion Apply topically 2 times a day. Apply to scalp and ears as needed for itching 60 mL 11    Cholecalciferol (VITAMIN D3) 2000 units Oral Cap Take 1 capsule (2,000 Units) by mouth daily. For low level 1  capsule 1    CLINDAMYCIN HCL OR Take by mouth. CLINDAMYCIN CREAM      Cyanocobalamin 1000 MCG Oral Tab one per day over-the-counter started 5/21 /2012 this level borderline low 1 Tab 1    diclofenac 1 % gel Apply 4 g topically 4 times a day. Apply to 2 gram four times daily to neck, trapezius muscle, and low back. Use a max of 32 grams a day. 300 g 4    DULoxetine 60 MG DR capsule Take 1 capsule (60 mg) by mouth 2 times a day. 180 capsule 2    EPINEPHrine 0.3 MG/0.3ML auto-injector INJECT INTRAMUSCULARLY ONCE AS NEEDED FOR SEVERE ALLERGIC REACTION 4 each 3    ibuprofen 200 MG tablet Take 1 tablet (200 mg) by mouth daily as needed (pain).      lidocaine 5 % patch Apply 1 patch onto the skin daily. Apply to painful area for up to 12 hours in a 24 hour period. 30 patch 2    lisinopril 20 MG tablet Take 1 tablet (20 mg)  by mouth every 12 hours. Please complete labs 180 tablet 0    naltrexone 50 MG tablet TAKE 1 TABLET BY MOUTH DAILY WITH FOOD 30 tablet 0    Omega-3 Fatty Acids (OMEGA 3 OR) Take 1 tablet by mouth daily.      pregabalin 300 MG capsule Take 1 capsule (300 mg) by mouth 2 times a day. 180 capsule 1    primidone 50 MG tablet Take 1 tablet (50 mg) by mouth 2 times a day. 180 tablet 4     Major Findings  On examination today, this is a pleasant 71 year old-year-old male.      Vital signs:  BP 122/82   Pulse 81   Ht 6\' 4"  (1.93 m)   Wt (!) 102.1 kg (225 lb)   SpO2 97%   BMI 27.39 kg/m     Head:  The head is normocephalic.  The head circumference is 61.5 cm, which is the 90-97th percentile for an adult male whose height is 76 inches or 193 centimeters.     Spine:  The spine is straight and not tender.  I can palpate the spinous processes.  There is no scoliosis.    Skin:  The skin has no neurocutaneous stigmata.    Mental Status:  Assessed for orientation, recent and remote memory, attention, concentration, language, and fund of knowledge. The MoCA version 7.3 score is 24/30.  This is down by 2 points since  2018.  He had no trouble with visual-spatial or executive tasks.  He named 2 of 3 animals.  He registered 5 of 5 words for immediate repetition and with delay recalled 2 of them.  He got 2 of the remaining words with either category cueing or multiple-choice, but missed the last word.  He did not have trouble with forward or backward digit span, go/no-go, serial 7 subtractions, sentence repetition, phonemic fluency, but did have slight difficulty with abstraction and missed the date by 1. The tester is certified to administer and score the MoCA. The MoCA form is scanned into the electronic medical record.     Cranial Nerve Examination:  The extraocular movements are full including vertical gaze without nystagmus.  The cover/uncover test is normal.  Convergence is intact.  The muscles of facial expression are intact as are facial sensation, hearing, head turning, cough, tongue, and palate movement.    Motor Exam:  Normal bulk, strength, and tone without atrophy, fasciculations, or drift.  There is a fine, fast resting tremor that is seen with drawing and handwriting, and with the outstretched arms. The right hand is the patient's dominant hand.     Reflexes: The reflexes are 0 and symmetric in the biceps, triceps, brachioradialis, and 0 and symmetric at the knees and ankles.     Sensory Exam:  Is intact to light touch.  A Romberg sign is present.    Cerebellar Exam:   Intact for finger-to-nose, and rapid alternating movements, including hand patting and finger tapping of each finger individually to the thumb.  There is no dysarthria.    Gait:  Assessed using the Tinetti assessment tool that shows a balance score of 11/16 and a gait score of 11/12 for a total score of 22/28.  This is a bit worse than before but the features are more consistent with the back pain and neuropathy and I do not see features of a higher level neurologic gait disorder as would be expected with hydrocephalus.    Assistive device: None  Conditions:  Carpet, Shoes   For the 10 meter timed gait:   Trial 1 he required 19 steps in 9.81 seconds, for a velocity of 1.02 m/s at a brisk pace.   Trial 2 he required 18 steps in 9.13 seconds, for a velocity of 1.10 m/s at a brisk pace.   The 180-degree turn required 4-5 steps.   Normal gait velocity at a brisk pace in the elderly is 1 m/s or higher.     Review of Imaging: Although we requested it prior to the visit, he declined getting a new brain MRI scan.  The previous brain MRI is not available from archives for my review.  My description of the brain MRI from 04/02/2016 indicates that the ventricles were enlarged with a frontal horn span of 5.7 cm and an Evans ratio of 0.40.  The third ventricle span was 13 mm.  The convexity profile was not consistent with DESH or the [place] high/tight criteria.  I indicated that atrophy was possibly noted over the convexity.  T2 signal abnormalities were seen throughout the corona radiata.  The residual of the small left thalamic hemorrhage was seen.      Assessment:  I explained my thoughts to him and his wife.  Crispin Coughran is here for reevaluation of hydrocephalus.  The concern on the part of his wife is his cognition.  On examination today, we find essentially a normal exam, although it is 2 points worse than the last test, but still better than the first test which was following his thalamic hemorrhage.  Fortunately, we have previous neuropsychology testing with Dr. Adolphus Birchwood.  His gait pattern today is more consistent with neuropathy than with hydrocephalus.  The incontinence is new and this may very likely be the result of mixed causes, such as his neuropathy and possible prostatic enlargement.    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 Adolphus Birchwood.  I would also like him to have a new brain MRI scan.  I understand from talking to him that he has other health issues that he would like to address first, but I would like him to try to get these done within the next several months.    He definitely needs another evaluation by urology, as his symptoms are worse and I suspect that they are not related to the hydrocephalus.  He would like to be seen by Dr. Joice Lofts again for his neuropathy.    His next visit with me will be following the neuropsychology testing or the brain MRI, or both.    Casimiro Needle A. Mayford Knife, MD  Neurologist  Professor of Neurology and Neurological Surgery  Director, Adult and Transitional Hydrocephalus and CSF Disorders    Billing Attestation  I spent a total of 125 minutes for the patient's care on the date of the service.

## 2022-04-07 NOTE — Patient Instructions (Signed)
I am glad that we were able to see you again today.  As a brief reminder, when you first came to see me in 2017, it was following the small brain hemorrhage that you had had and was also for the question of hydrocephalus.  Indeed, at that time you had some cognitive difficulty and we saw improvement in it over time.  You had more detailed formal neuropsychological evaluation in 2018 by Dr. Adolphus Birchwood.  At that time, we felt that most of the difficulty that you had was referable to the previous small brain hemorrhage and we did not see any other cause of this.    There have been some changes in your thinking and memory since we saw you in 2018.  The real question is to determine the range and the extent of any changes.  As we discussed earlier in our visit, I think that an MRI of the brain is in order.  I have placed an order for one that can be done at [facility].  Additionally, I have put in a referral for you to be seen by Dr. Adolphus Birchwood again.  Both of these are necessary for [place] to understand whether there has been any significant change in your thinking and your memory.    The walking and balance difficulty that you have appears referable to the back pain and neuropathy, based on my examination today.  I do not see features of the gait pattern that would normally be expected with hydrocephalus.    For your neuropathy, I have put in another referral for you to be seen by Dr. Joice Lofts.  For your incontinence, which is new since I saw you in 2017, I have put in another referral to the urology department.    I recognize that the timing of these tests and consultations will be dependent on the surgery and other treatments that you are going to have in the near future.  Nonetheless, I hope that he will follow through with them so that we can come to a better understanding of whether there were any significant changes.    You can contact my office by sending a message via MyChart to set up your next appointment or call  my coordinator at the number below. For some of our patients, a return appointment may be with our Nurse Practitioner.     Sending a message via MyChart is similar to sending an e-mail and often saves patients from the hassle of navigating the [facility] telephone system or waiting on hold. I encourage patients to send messages to schedule appointments or for correspondence that is routine and not urgent.     To reach Dr. Mayford Knife' coordinator to make or change an appointment, please call [phone].    To reach a nurse in Dr. Mayford Knife' office for medical questions or concerns, please call [phone] and select option 2.

## 2022-04-12 ENCOUNTER — Other Ambulatory Visit (HOSPITAL_BASED_OUTPATIENT_CLINIC_OR_DEPARTMENT_OTHER): Payer: Self-pay | Admitting: Psychiatry

## 2022-04-12 DIAGNOSIS — F1020 Alcohol dependence, uncomplicated: Secondary | ICD-10-CM

## 2022-04-13 ENCOUNTER — Ambulatory Visit (INDEPENDENT_AMBULATORY_CARE_PROVIDER_SITE_OTHER): Admit: 2022-04-13 | Discharge: 2022-04-13 | Disposition: A | Discharge status: Home / Self Care | Payer: Self-pay

## 2022-04-13 ENCOUNTER — Ambulatory Visit (INDEPENDENT_AMBULATORY_CARE_PROVIDER_SITE_OTHER): Payer: Medicare PPO | Admitting: Podiatrist

## 2022-04-13 ENCOUNTER — Ambulatory Visit (INDEPENDENT_AMBULATORY_CARE_PROVIDER_SITE_OTHER): Admit: 2022-04-13 | Discharge: 2022-04-13 | Disposition: A | Discharge status: Home / Self Care | Payer: Medicare PPO

## 2022-04-13 DIAGNOSIS — M216X9 Other acquired deformities of unspecified foot: Secondary | ICD-10-CM

## 2022-04-13 DIAGNOSIS — M205X1 Other deformities of toe(s) (acquired), right foot: Secondary | ICD-10-CM

## 2022-04-13 DIAGNOSIS — G609 Hereditary and idiopathic neuropathy, unspecified: Secondary | ICD-10-CM

## 2022-04-13 DIAGNOSIS — M79674 Pain in right toe(s): Secondary | ICD-10-CM

## 2022-04-13 MED ORDER — NALTREXONE HCL 50 MG OR TABS
ORAL_TABLET | ORAL | 0 refills | Status: DC
Start: 2022-04-13 — End: 2022-05-08

## 2022-04-13 NOTE — Progress Notes (Signed)
Patient: Ronnie Mason   Patient DOB: 09/02/1950     DOS:  04/13/2022     Accompanied by:  patient was unaccompanied    Subjective:  (history is obtained by MA/RN and edited by [name])  Ronnie Mason is a 71 year old year old male who presents today for re-evaluation of right foot.    The patient states not better.     The current condition has existed for long time.    Patient describes the symptoms as sore toes, 2nd and 3rd , bump on top, bleeds.     The level the pain/severity of pain is moderate.   The condition is worse when walking .     Current treatments/recent studies include none.     The response to the treatment is not helping.     Patient's activity limitation is limited in walking.       His PT felt that he has LLD.  He is wear Quickstride insert.      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    Essential tremor 12/16/2015    Essential hypertension 12/16/2015    Intraparenchymal hemorrhage of brain (HCC) 2017    Idiopathic peripheral neuropathy 03/15/2015    Spondylosis of cervical region without myelopathy or radiculopathy 05/28/2014    Back pain, lumbosacral 02/15/2013    B12 deficiency 12/21/2012    Borderline low 5 /21/2014    Chronic renal insufficiency, stage III (moderate) (HCC) 12/21/2012    5/ 2013    Hyperlipidemia 09/26/2012    Cervicalgia 03/11/2010    Radiofrequency ablation of c3 to c 7    Allergic rhinitis due to allergen     Carotid Sinus Hypersensitivity     Disc disorder of lumbosacral region     Dizziness     Heart murmur     Hip injury     HYPERTENSION      Nosebleed     Stroke [facility])     Syncope     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(HCC)          Review of patient's allergies indicates:  Allergies   Allergen Reactions    Adhesives Skin: Rash     Glue on lido patches    Bee Venom Skin: Hives, Skin: Itching and Swelling    Gabapentin Other     Flu like symptoms, diarrhea, body ache upset stomach    Methocarbamol Other     Patient's wife reports cognitive difficulties ("goofy")        Medications:   Outpatient Medications Prior to Visit   Medication Sig Dispense Refill    Acetaminophen 500 MG Oral Tab Take 2 tablets (1,000 mg) by mouth 2 times a day.      Alpha-Lipoic Acid 300 MG tablet Take 1 tablet by mouth daily.      amLODIPine 5 MG tablet Take 1 tablet (5 mg) by mouth 2 times a day. 180 tablet 3    atorvastatin 10 MG tablet Take 1 tablet (10 mg) by mouth daily. 90 tablet 3    BENFOTIAMINE  OR Take 250 mg by mouth daily.      betamethasone dipropionate 0.05 % lotion Apply topically 2 times a day. Apply to scalp and ears as needed for itching 60 mL 11    Cholecalciferol (VITAMIN D3) 2000 units Oral Cap Take 1 capsule (2,000 Units) by mouth daily. For low level 1 capsule 1    CLINDAMYCIN HCL OR Take by mouth. CLINDAMYCIN CREAM      Cyanocobalamin 1000 MCG Oral Tab one per day over-the-counter started 5/21 /2012 this level borderline low 1 Tab 1    diclofenac 1 % gel Apply 4 g topically 4 times a day. Apply to 2 gram four times daily to neck, trapezius muscle, and low back. Use a max of 32 grams a day. 300 g 4    DULoxetine 60 MG DR capsule Take 1 capsule (60 mg) by mouth 2 times a day. 180 capsule 2    EPINEPHrine 0.3 MG/0.3ML auto-injector INJECT INTRAMUSCULARLY ONCE AS NEEDED FOR SEVERE ALLERGIC REACTION 4 each 3    ibuprofen 200 MG tablet Take 1 tablet (200 mg) by mouth daily as needed (pain).      lidocaine 5 % patch Apply 1 patch onto the skin daily. Apply to painful area for up to 12 hours in a 24 hour period. 30 patch 2    lisinopril 20 MG tablet Take 1 tablet (20 mg) by mouth every 12 hours. Please complete labs 180 tablet 0    naltrexone 50 MG  tablet TAKE 1 TABLET BY MOUTH DAILY WITH FOOD 30 tablet 0    Omega-3 Fatty Acids (OMEGA 3 OR) Take 1 tablet by mouth daily.      pregabalin 300 MG capsule Take 1 capsule (300 mg) by mouth 2 times a day. 180 capsule 1    primidone 50 MG tablet Take 1 tablet (50 mg) by mouth 2 times a day. 180 tablet 4     No facility-administered medications prior to visit.        PSH:   Past Surgical History:   Procedure Laterality Date    L meniciscus Left 07/2017    by Dr Marcelle Overlie , re did left menisicus     PR ANES; COLONOSCOPY  2002    repeat in 3 years    PR ANES; COLONOSCOPY & POLYPECTOMY  11/18/2007    repeat in 3 years    PR HALLUX RIGIDUS W/CHEILECTOMY 1ST MP JT W/O IMPLT Right 10/21/2017    Dr. Donn Pierini    PR UNLISTED PROCEDURE FEMUR/KNEE      PR UNLISTED PROCEDURE HANDS/FINGERS      PR UNLISTED PROCEDURE SPINE  2013    rfa to c3 to c 7     TOE SURGERY Right 02/09/2019    toe surgery  Right 01/12/2019    Dr Hilarie Fredrickson         Family History:  family history includes Colon Cancer in his father; Heart (other) in his mother; Other Family Hx in an other family member. There is no history of Cancer.    Social History:  Social History     Socioeconomic History    Marital status: Married     Spouse name: Not on file    Number of children: Not on file    Years of education: Not on file    Highest education level: Not on file   Occupational History    Not on file   Tobacco Use    Smoking status: Never  Smokeless tobacco: Current     Types: Chew    Tobacco comments:     Sometimes chews, but is trying to quit. 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  Non-smoker, 14 or more drinks/week almost exclusively wine with dinner.            Social Determinants of Health     Financial Resource Strain: Not on file   Food Insecurity: Not on file   Transportation Needs: Not on file   Physical Activity: Not on file   Stress: Not on file   Social Connections: Not on file   Intimate Partner Violence: Not on file   Housing Stability: Not on file        Physical Exam:   There were no vitals filed for this visit.     General: The patient is 71 year old year old male White  with constitutional symptom: appears age stated.    Psychiatric/mood: normal mood and affect.    Neurological: alert and oriented x3.    Vascular:   RIGHT:  Dorsalis pedis pulse is palpable.  Posterior tibial pulse is palpable. Capillary filling time note to be immediate on the distal hallux.      Neurologic:  RIGHT:  The sensation is diminished on the toes.   Motor coordination is intact with normal muscle tone.  There is no manifestations of pathological reflexes noted.      Dermatological:   RIGHT:  Upon inspection, the skin note to have normal texture/tone/turgor and superficial irritation at dorsal DIPJ .  There is no ulceration/open wound or dermatosis present.  Integument coloration is within normal range and temperature is within normal range.  The hair growth is decrease.      Musculoskeletal:   RIGHT:  There is rigid mallet toe deformity on 2nd toe and semi-rigid mallet toe deformity on 3rd toe.  There is mild-moderate pain at dorsal DIPJ.  There is redness/erythema noted.  There is no contracture of the 2nd and 3rd MTPJ.  The 2nd and 3rd MTPJ is stable.  The modify Lachman's test is negative.   S/P 1st MTPJ and hallux IPJ arthrodesis with good alignment.     Limb length evaluation was performed.  First both knees were extended and leg length was assessed.  Second, the knees were flexed and foot/ankle was placed in equal setting and knee position was  assess where it is piston proximally or distally.  The exam reveals both lower limbs are structurally equal with possible functional LLD.     XR Foot 3 View Right    Result Date: 04/13/2022  EXAMINATION: XR FOOT 3 VW RIGHT CLINICAL INDICATION: pain at DIPJ of 2nd/3rd toe COMPARISON:  04/16/21 FINDINGS: S/P fusion of first MTP joint, similar to the prior exam. No complications are noted. There is appropriate progress towards healing without significant change in position or alignment.  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  Discussion included spectrum of treatments available such as continue with current care and surgical correction.   After the discussion, today patient elected surgical correction.        He may tried right heel lift on the right for hip pain relief - 1/8" first and follow by 1/4" left if 1/8" is not helpful.      Patient inquire about surgery.  Following procedure(s) was/were discussed: Mallet toe correction(28285), 2nd toe, and 3rd toe, RIGHT .  Weight bearing status (protective weight bearing).  [facility] perform at: [facility].  Approximate time needed for this surgery is 1 hours.  Pre-operative H & P will be perform by PCP.      We have reviewed today's x-ray finding and detail discussion took place with the patient.      Return appointment: follow up for surgical discussion       [name] DPM, FACFAS  Reconstructive Ankle and Foot Surgeon  [facility]

## 2022-04-13 NOTE — Progress Notes (Signed)
Accompanied by:  patient was unaccompanied    Ronnie Mason is a 71 year old year old male who presents today for re-evaluation of right foot.    The patient states not better.     The current condition has existed for long time.    Patient describes the symptoms as sore toes, 2nd and 3rd , bump on top, bleeds.     The level the pain/severity of pain is moderate.   The condition is worse when walking .     Current treatments/recent studies include none.     The response to the treatment is not helping.     Patient's activity limitation is limited in walking.      His PT felt that he has LLD.  He is wear Quickstride insert.

## 2022-04-13 NOTE — Patient Instructions (Signed)
Choose appropriate shoes/sandal/insert as marked by provider.

## 2022-04-14 ENCOUNTER — Other Ambulatory Visit (HOSPITAL_BASED_OUTPATIENT_CLINIC_OR_DEPARTMENT_OTHER): Payer: Self-pay | Admitting: Physician Assistant

## 2022-04-14 ENCOUNTER — Encounter (HOSPITAL_BASED_OUTPATIENT_CLINIC_OR_DEPARTMENT_OTHER): Payer: Self-pay | Admitting: Student in an Organized Health Care Education/Training Program

## 2022-04-14 ENCOUNTER — Ambulatory Visit
Admission: RE | Admit: 2022-04-14 | Discharge: 2022-04-14 | Disposition: A | Discharge status: Home / Self Care | Payer: Medicare PPO | Attending: Neuroradiology | Admitting: Neuroradiology

## 2022-04-14 ENCOUNTER — Ambulatory Visit (HOSPITAL_BASED_OUTPATIENT_CLINIC_OR_DEPARTMENT_OTHER): Payer: Medicare PPO | Admitting: Student in an Organized Health Care Education/Training Program

## 2022-04-14 VITALS — BP 135/90 | HR 80 | Temp 98.3°F | Resp 16 | Ht 76.0 in | Wt 232.0 lb

## 2022-04-14 DIAGNOSIS — M5412 Radiculopathy, cervical region: Secondary | ICD-10-CM

## 2022-04-14 DIAGNOSIS — M4802 Spinal stenosis, cervical region: Secondary | ICD-10-CM

## 2022-04-14 NOTE — Progress Notes (Signed)
NEW PATIENT CONSULT  MRN: [id number]    CHIEF COMPLAINT:  Neck pain to LUE     REFERRING PROVIDER:   Renea Ee, MD  [id number]  [place],  [place] [address]    HISTORY OF PRESENT ILLNESS:   Ronnie Mason is a 71 year old male referred by Dr. Araceli Bouche in the [facility] for chronic LUE radiculopathy for surgical opinion after failing 2 cervicothoracic ESI with ?1 week of relief. The first injection was 10/24/21 and was a C7-T1 interlaminar injection and the second was an interlaminar injection at the left C6-C7 on 01/09/22.     The patient is here with wife reporting that first injection was more effective than the second. Neck pain began >10 years and history of MBB and RFA with  Pain provider Dr. Quincy Simmonds and Dr. Amada Jupiter. He reports that RFA helped for >10 years but has been struggling with increased neck pain to the left arm in the last two years. Pain impacts bil neck, at base, and radiates over shoulder/blade down lateral arm to the elbow and stops. It does not go past the elbow, does not affect fingers or grip. Denies numbness or tingling in these areas. He has no RUE symptoms.  Neck pain is worse than the arm pain. Aggravating factors include driving, turning head side to side, extending his neck. Relieving factors include use of TENS unit and sitting down to rest. Currently working with PT and tries acupuncture. Takes Lyrica and Duloxetine per Pain Clinic. Also takes APAP daily and sometimes IBU/Naproxen.      ACTIVE PROBLEM LIST:  Patient Active Problem List   Diagnosis    Syncope    Hypotension    Carotid Sinus Hypersensitivity    URI (upper respiratory infection)    Cervicalgia    Hyperlipidemia    Chronic pain    Bee sting allergy    Numbness and tingling of leg    Chronic back pain    B12 deficiency    Chronic renal insufficiency, stage III (moderate) (HCC)    Back pain, lumbosacral    Lumbar radiculopathy, chronic    Neck pain    Fall    Tremor    Sensory neuropathy    Spondylosis of  cervical region without myelopathy or radiculopathy    Idiopathic peripheral neuropathy    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)    Balance problem    Chronic pain of both shoulders    Vitamin D deficiency    Difficulty in walking, not elsewhere classified    Impaired mobility and ADLs    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(HCC)    Primary osteoarthritis of right hip    Mild vascular neurocognitive disorder    Right lumbar radiculitis    Greater trochanteric pain syndrome of right lower extremity    Complex tear of medial meniscus of left knee as current injury    Primary osteoarthritis of left knee    Acute left-sided low back pain without sciatica    Chronic left-sided low back pain without sciatica    Enuresis    Closed fracture of one rib of right side  MRSA (methicillin resistant staph aureus) culture positive    Myofascial pain    Macrocytosis without anaemia    Psychological factors affecting medical condition    Chronic bilateral low back pain without sciatica     PAST MEDICAL HISTORY:  Past Medical History:   Diagnosis Date    Cellulitis and abscess of foot 12/02/2020    Added automatically from request for surgery [id number]    Enuresis 09/06/2018    Vitamin D deficiency 02/13/2016    Pain of right hip joint 01/23/2016    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    Essential tremor 12/16/2015    Essential hypertension 12/16/2015    Intraparenchymal hemorrhage of brain (HCC) 2017    Idiopathic peripheral neuropathy 03/15/2015    Spondylosis of cervical region without myelopathy or radiculopathy 05/28/2014    Back pain, lumbosacral 02/15/2013    B12 deficiency 12/21/2012     Borderline low 5 /21/2014    Chronic renal insufficiency, stage III (moderate) (HCC) 12/21/2012    5/ 2013    Hyperlipidemia 09/26/2012    Cervicalgia 03/11/2010    Radiofrequency ablation of c3 to c 7    Allergic rhinitis due to allergen     Carotid Sinus Hypersensitivity     Disc disorder of lumbosacral region     Dizziness     Heart murmur     Hip injury     HYPERTENSION      Nosebleed     Stroke [facility])     Syncope     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[facility])      SURGICAL HISTORY:  Past Surgical History:   Procedure Laterality Date    L meniciscus Left 07/2017    by Dr Marcelle Overlie , re did left menisicus     PR ANES; COLONOSCOPY  2002    repeat in 3 years    PR ANES; COLONOSCOPY & POLYPECTOMY  11/18/2007    repeat in 3 years    PR HALLUX RIGIDUS W/CHEILECTOMY 1ST MP JT W/O IMPLT Right 10/21/2017    Dr. Donn Pierini    PR UNLISTED PROCEDURE FEMUR/KNEE      PR UNLISTED PROCEDURE HANDS/FINGERS      PR UNLISTED PROCEDURE SPINE  2013    rfa to c3 to c 7     TOE SURGERY Right 02/09/2019    toe surgery  Right 01/12/2019    Dr Hilarie Fredrickson      SOCIAL HISTORY:  Social History     Tobacco Use    Smoking status: Never    Smokeless tobacco: Current     Types: Chew    Tobacco comments:     Sometimes chews, but is trying to quit. 11/28/21   Substance Use Topics    Alcohol use: Not Currently     Alcohol/week: 7.0 standard drinks     Types: 7 Glasses of wine per week     Comment: goal to get down to 2 per day     FAMILY HISTORY:  Family History       Problem (# of Occurrences) Relation (Name,Age of Onset)    Heart (other) (1) Mother: MI    Other Family Hx (1) Other: myelodysplasia    Colon Cancer (1) Father           Negative family history of: Cancer          ALLERGIES:  Review of patient's allergies indicates:  Allergies   Allergen Reactions    Adhesives Skin:  Rash     Glue on lido patches    Bee Venom Skin: Hives, Skin: Itching and Swelling    Gabapentin Other     Flu like symptoms, diarrhea, body ache upset stomach    Methocarbamol Other      Patient's wife reports cognitive difficulties ("goofy")       PHYSICAL EXAM:  BP 135/90   Pulse 80   Temp 36.8 C (Temporal)   Resp 16   Ht 6\' 4"  (1.93 m) Comment: patient reported  Wt (!) 105.2 kg (232 lb)   SpO2 96%   BMI 28.24 kg/m   General: healthy, alert, no distress  Body Habitus: normal build   Respiratory:  Inspection:  normal respiratory effort and chest wall movement with respiration.  Neurologic:   Motor:    Tone: normal    Atrophy/Fasciculations: absent   Strength Testing:    Right Left   Shoulder Abduction  Deltoid (C5, C6) 5/5 5/5   Elbow Flexion  Biceps (C5, C6) 5/5 5/5   Elbow Extension  Triceps (C6-C8) 5/5 5/5   Handgrip  3rd FDP (C8) 5/5 5/5   Interossei  ADM (T1) 5/5 5/5       Right Left   Hip Flexion  Iliopsoas (L2, L3) 5/5 5/5   Knee Extension  Quadriceps (L4) 5/5 5/5   Ankle Flexion  Tibialis Anterior (L4, L5) 5/5 5/5   Plantar Extension  Gastrocnemius (S1) 5/5 5/5   Sensory:  Light Touch Right Left   Lateral arm (C5) Normal Normal   Lateral forearm/hand (C6) Normal Normal   3rd digit (C7) Normal Normal   Medial hand (C8) Normal Normal   Medial forearm (T1) Normal Normal   Reflexes: Hoffman's negative bil     IMAGING:  XR C Spine today shows C4 over C5 anterolisthesis, stable on flex/ex  MRI C Spine 09/2021 with C4-C5 disc herniation    DIAGNOSIS:  Patient Active Problem List   Name Primary?    Cervical stenosis of spine Yes    Cervical radiculopathy, chronic      PLAN:  Ronnie Mason is a 71 year old male here for chronic neck pain and LUE radiculopathy without sustained benefit after ESI. Imaging notable for findings above. Discussion with patient was completed by Dr. 78 independently. See his note for details. Final impression is suspicion for C5 vs C6 radiculopathy and would like EMG with RTC after.     Orders Placed This Encounter    XR C Spine 4-5 Vw    EMG/Nerve Conduction Study       Dr. Welton Flakes and I spent a total of 35 minutes for the patient's care on the date of the  service.          Welton Flakes, MSPAP, PA-C  Department of Neurological Surgery   [facility]

## 2022-04-22 ENCOUNTER — Other Ambulatory Visit (INDEPENDENT_AMBULATORY_CARE_PROVIDER_SITE_OTHER): Payer: Self-pay | Admitting: Internal Medicine

## 2022-04-22 DIAGNOSIS — I10 Essential (primary) hypertension: Secondary | ICD-10-CM

## 2022-04-23 ENCOUNTER — Ambulatory Visit (INDEPENDENT_AMBULATORY_CARE_PROVIDER_SITE_OTHER): Payer: Medicare PPO | Admitting: Internal Medicine

## 2022-04-23 ENCOUNTER — Encounter (INDEPENDENT_AMBULATORY_CARE_PROVIDER_SITE_OTHER): Payer: Self-pay | Admitting: Internal Medicine

## 2022-04-23 ENCOUNTER — Encounter (INDEPENDENT_AMBULATORY_CARE_PROVIDER_SITE_OTHER): Payer: Self-pay | Admitting: Podiatrist

## 2022-04-23 VITALS — BP 151/87 | HR 81 | Temp 98.4°F | Wt 225.0 lb

## 2022-04-23 DIAGNOSIS — M205X1 Other deformities of toe(s) (acquired), right foot: Secondary | ICD-10-CM

## 2022-04-23 DIAGNOSIS — Z01818 Encounter for other preprocedural examination: Secondary | ICD-10-CM

## 2022-04-23 DIAGNOSIS — F09 Unspecified mental disorder due to known physiological condition: Secondary | ICD-10-CM

## 2022-04-23 DIAGNOSIS — G379 Demyelinating disease of central nervous system, unspecified: Secondary | ICD-10-CM

## 2022-04-23 DIAGNOSIS — S069XAS Unspecified intracranial injury with loss of consciousness status unknown, sequela: Secondary | ICD-10-CM

## 2022-04-23 DIAGNOSIS — Q043 Other reduction deformities of brain: Secondary | ICD-10-CM

## 2022-04-23 DIAGNOSIS — F01A0 Vascular dementia, mild, without behavioral disturbance, psychotic disturbance, mood disturbance, and anxiety: Secondary | ICD-10-CM

## 2022-04-23 DIAGNOSIS — G3189 Other specified degenerative diseases of nervous system: Secondary | ICD-10-CM

## 2022-04-23 DIAGNOSIS — N1831 Chronic kidney disease, stage 3a: Secondary | ICD-10-CM

## 2022-04-23 DIAGNOSIS — F1020 Alcohol dependence, uncomplicated: Secondary | ICD-10-CM

## 2022-04-23 DIAGNOSIS — L97501 Non-pressure chronic ulcer of other part of unspecified foot limited to breakdown of skin: Secondary | ICD-10-CM

## 2022-04-23 NOTE — Progress Notes (Signed)
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, traumatic brain injury   Had been having trouble with his memory  Has significantly difficult abnormal gait and balance history of right toe fusion    Distant renal insufficiency but kidney function test have been normal for the last few years    Severe neck and back pain seeing the pain clinic     Patient Care Team:  Donnal Moat, MD as PCP - General (Internal Medicine)  Cleotis Nipper, MD as Cardiologist  (Cardiology)  Dennison Bulla, MD (Sports Medicine)  Lavell Islam, MD (Gastroenterology)  Gerrianne Scale, MD (General Surgery)  Gayleen Orem, [place] (Urology)  Wilmon Arms Dong-Hyun, DPM (Podiatry)  Art Buff, MD as Primary Contact (Pain Management)  Kirke Shaggy, MD as Surgeon (Otolaryngology)      Today follow-up above issues:    Right toe pending with Dr. Maudie Mercury second and third toe they are starting to curl  Pending surgery 9/28  Patient is also seeing Dr. Humphrey Rolls who saw the patient [phone] with the complaint of the left arm pain and neck pain with limited benefit from conservative treatment and they are looking at treating the multiple level foraminal stenosis on the left and some listhesis-Dr. Yancey Flemings wanted an EMG according to his note to help discern between C5 and 6 radiculopathy    There is no shortness of breath no chest pain  Patient is doing pool walking exercise does well there is no shortness of breath or chest discomfort or lightheaded with this      One of the hurdles he  has had in the past was excessive alcohol consumption he is no longer doing that and has been really good about keeping down to 1 glass of wine at the most at the most 4 days a week      He wished that there was some pain medication I could give him to make it go away    Orders Only on 03/24/22   1. Chronic Pain Drug Risk 1 w/Cnslt, Urine   Result Value Ref Range    Expected Drugs, URN SEE NOTES     Drug Screen Interp Comment, URN (NOTE)     Cocaine (Qual), URN Negative NRN    Opiates (Qual), URN Negative NRN    Amphet/Methamphetamine Qual,URN Negative NRN    Cannabinoids (Qual), URN Negative NRN    Barbiturate (Qual), URN Positive (A) NRN    Benzodiazepines (Qual), URN Negative NRN    Methadone (Qual), URN Negative NRN    Oxycodone (Qual), URN Negative NRN    Buprenorphine Qualitatiave, URN Negative NRN    Tramadol Qual, URN Negative NRN    Fentanyl Qual, URN Negative NRN    Drug Screen Test Info, URN SEE NOTES      *Note: Due to  a large number of results and/or encounters for the requested time period, some results have not been displayed. A complete set of results can be found in Results Review.       XR Foot 3 View Right    Result Date: 04/13/2022  EXAMINATION: XR FOOT 3 VW RIGHT CLINICAL INDICATION: pain at DIPJ of 2nd/3rd toe COMPARISON:  04/16/21 FINDINGS: S/P fusion of first MTP joint, similar to the prior exam. No complications are noted. There is appropriate progress towards healing without significant change in position or alignment.     XR C Spine 4-5 Vw    Result Date: 04/14/2022  EXAMINATION: XR C SPINE 4-5 VW CLINICAL INDICATION: standing, please include flex ex COMPARISON: July 23, 2021. MRI from September 03, 2021. FINDINGS AND    In the neutral position, a grade 1-2 anterolisthesis at the C4-5 level is similar to December 2022. No other spinal malalignment. There is good range of motion with neck flexion. Limited range of motion with neck extension. The C4-5 anterolisthesis is unchanged in both  flexion and extension. No dynamic instability demonstrated. No acute fracture is seen. Multilevel degenerative disc disease. Degenerative changes are also noted in the facet joints at multiple levels bilaterally. No abnormal prevertebral soft tissue swelling.       MEDICATION PRIOR TO VISIT  Reports   Outpatient Medications Prior to Visit   Medication Sig Dispense Refill    Acetaminophen 500 MG Oral Tab Take 2 tablets (1,000 mg) by mouth 2 times a day.      Alpha-Lipoic Acid 300 MG tablet Take 1 tablet by mouth daily.      amLODIPine 5 MG tablet Take 1 tablet (5 mg) by mouth 2 times a day. 180 tablet 3    atorvastatin 10 MG tablet Take 1 tablet (10 mg) by mouth daily. 90 tablet 3    BENFOTIAMINE OR Take 250 mg by mouth daily.      betamethasone dipropionate 0.05 % lotion Apply topically 2 times a day. Apply to scalp and ears as needed for itching 60 mL 11    Cholecalciferol (VITAMIN D3) 2000 units Oral Cap Take 1 capsule (2,000 Units) by mouth daily. For low level 1 capsule 1    CLINDAMYCIN HCL OR Take by mouth. CLINDAMYCIN CREAM      Cyanocobalamin 1000 MCG Oral Tab one per day over-the-counter started 5/21 /2012 this level borderline low 1 Tab 1    diclofenac 1 % gel Apply 4 g topically 4 times a day. Apply to 2 gram four times daily to neck, trapezius muscle, and low back. Use a max of 32 grams a day. 300 g 4    DULoxetine 60 MG DR capsule Take 1 capsule (60 mg) by mouth 2 times a day. 180 capsule 2    EPINEPHrine 0.3 MG/0.3ML auto-injector INJECT INTRAMUSCULARLY ONCE AS NEEDED FOR SEVERE ALLERGIC REACTION 4 each 3    ibuprofen 200 MG tablet Take 1 tablet (200 mg) by mouth daily as needed (pain).      lidocaine 5 % patch Apply 1 patch onto the skin daily. Apply to painful area for up to 12 hours in a 24 hour period. 30 patch 2    lisinopril 20 MG tablet Take 1 tablet (20 mg) by mouth every 12 hours. Please complete labs 180 tablet 0    naltrexone 50 MG tablet TAKE 1 TABLET BY MOUTH DAILY WITH FOOD 30 tablet 0     Omega-3 Fatty Acids (OMEGA 3 OR) Take 1  tablet by mouth daily.      pregabalin 300 MG capsule Take 1 capsule (300 mg) by mouth 2 times a day. 180 capsule 1    primidone 50 MG tablet Take 1 tablet (50 mg) by mouth 2 times a day. 180 tablet 4     No facility-administered medications prior to visit.       REVIEW OF SYSTEMS -   Not reviewed if not marked   P=Positive  N=Negative      P   N    []  [x]  Constitutional:   []  []  Eyes:    []  []  Head ears, nose mouth throat:  []  [x]  Cardiovascular:    []  [x]  Respiratory:     []  [x]  Gastrointestinal:     [x]  []  Genitourinary:    Only at night does he have urinary function loss  []  []  Musculoskeletal:      []  []  Integumentary:     []  []  Neurological:     []  []  Psychiatric/Behavioral:      []  []  Endocrine:     []  []  Hematological:     []  []  Allergic/Immunologic:      []  []  Breast:   []  []  Male: GU /testicular:         PHYSICAL Exam    BP (P) 132/68   Pulse (P) 95   Temp 36.9 C (Temporal)   Wt (!) 102.1 kg (225 lb) Comment: pt states wt  SpO2 97%   BMI 27.39 kg/m     Physical Exam    Pleasant  In nad    Patient is not in acute distress he seems much more calm and conversational and relaxed    Thyroid is not enlarged heart regular rate and rhythm no murmurs lungs clear to auscultation there is no leg edema neck 2+ carotid pulses no bruits negative Romberg      Patient appears to be much more remarkably more calm than he has been in the past    IMPRESSION / PLAN / DISCUSSION   (Z01.818) Pre-op exam  (primary encounter diagnosis)  (L97.501) Ulcer of foot, limited to breakdown of skin, unspecified laterality (HCC)  (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 Maudie Mercury    At some point he will also probably get some type of cervical spine surgery also     As for the toe surgery I do not see any major contraindication to this    He  has gone through surgeries in the past without major complications  The complications came postop when he was not following directions for post wound care and he promises to not be doing things that he should not be doing to open up the wound    He has no new cardiac symptoms    His blood pressure is a little elevated today and we will repeat and normal on repeat , no chanes needed     Patient has had some question of cognitive impairment whether this is from the prior bleed or something else like demyelination or alcohol use or head trauma , its not clear I do not think but today he is pretty much down to very minimal alcohol and I wonder if this makes him a little bit more clearheaded no doubt I think it is and he is congratulated on his hard work on this    I am not worried about his kidney function this is a distant problem but need to keep in mind for anesthesia     Further work-up with neurosurgery is pending he had been ordered an EMG and I do not know if surgery is going to happen    No contraindication to foot surgery at this point      Patient declines blood draw as  it was done a month ago but if podiatry wants it podiatry can order      We do not have the COVID-vaccine or the flu shot yet  If you want the flu shot you can also get it at the pharmacy they have it but do not get any vaccination for about 3 days prior to surgery      Will be seeing Dr Rudolpho Sevin neurology next wk     Bp is up today he said in pain  can monitor at home       Discussed hm due along with other issues above   Health Maintenance Topics with due status: Overdue       Topic Date Due    COVID-19 Vaccine 10/14/2021     Health Maintenance Topics with due status: Due On       Topic Date Due    Medicare Annual Wellness Visit  can do with Camila Li on telehealth  04/01/2022         No follow-ups on file.    Patient   express understanding of care plan/medications  Risk of taking medication properly and follow up discussed especially to call if problem    Created with the assistance of voice recognition software dictation

## 2022-04-23 NOTE — Telephone Encounter (Signed)
Patient has an upcoming appointment. Defer to provider complete at that time.

## 2022-04-24 ENCOUNTER — Encounter (INDEPENDENT_AMBULATORY_CARE_PROVIDER_SITE_OTHER): Payer: Self-pay

## 2022-04-24 LAB — EKG 12 LEAD
Atrial Rate: 72 {beats}/min
P Axis: 50 degrees
P-R Interval: 168 ms
Q-T Interval: 390 ms
QRS Duration: 78 ms
QTC Calculation: 427 ms
R Axis: 45 degrees
T Axis: 50 degrees
Ventricular Rate: 72 {beats}/min

## 2022-04-27 MED ORDER — LISINOPRIL 20 MG OR TABS
20.0000 mg | ORAL_TABLET | Freq: Two times a day (BID) | ORAL | 3 refills | Status: DC
Start: 2022-04-27 — End: 2023-04-20

## 2022-04-28 ENCOUNTER — Telehealth (HOSPITAL_BASED_OUTPATIENT_CLINIC_OR_DEPARTMENT_OTHER): Payer: Self-pay

## 2022-04-28 NOTE — Telephone Encounter (Signed)
Pt is having really bad pain in his neck, he states that it is "semi" broken and he wants to be seen sooner than 10/6 w/Dr. Lisbeth Ply.   I did add pt to the waitlist.   Also tx to [facility] the procedure scheduler.    Derrek Puff PSS

## 2022-04-29 ENCOUNTER — Encounter (HOSPITAL_COMMUNITY): Payer: Self-pay

## 2022-04-30 ENCOUNTER — Ambulatory Visit: Payer: Medicare PPO | Attending: Podiatrist

## 2022-04-30 ENCOUNTER — Other Ambulatory Visit (INDEPENDENT_AMBULATORY_CARE_PROVIDER_SITE_OTHER): Payer: Self-pay | Admitting: Podiatrist

## 2022-04-30 ENCOUNTER — Ambulatory Visit (INDEPENDENT_AMBULATORY_CARE_PROVIDER_SITE_OTHER): Payer: Medicare PPO | Admitting: Podiatrist

## 2022-04-30 DIAGNOSIS — M205X1 Other deformities of toe(s) (acquired), right foot: Secondary | ICD-10-CM | POA: Insufficient documentation

## 2022-04-30 MED ORDER — OXYCODONE HCL 5 MG OR TABS
5.0000 mg | ORAL_TABLET | Freq: Four times a day (QID) | ORAL | 0 refills | Status: DC | PRN
Start: 2022-04-30 — End: 2022-06-24

## 2022-04-30 NOTE — H&P (View-Only) (Signed)
Patient: Ronnie Mason   Patient DOB: [birth year]     DOS:  04/30/2022     Accompanied by:  patient was unaccompanied    Chief Complaint: [name] is a 71 year old year old male who presents today for evaluation of painful mallet toe on right 2nd/3rd toe.  After discussion, patient elected for surgical care, RIGHT.       History of present Illness:    (history is obtained by MA/RN and edited by Dr. Maudie Mason)  The patient states that the condition has existed for many, year(s) and began gradually.    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    The precipitating event was none.    Patient describes the symptoms as  sore and painful  .    Patient denies  injury .    The quality of pain aching, sharp, and throbbing.    The level the pain moderate.    The condition is not getting better.    The affected area is made worse by  activity .    Past treatments/studies include nothing .    The patient states no improvement with treatment.    Previous diagnostic test/evaluation:  x-ray.    Patient's limitation is activities are limited in walking and limited in recreation.       Goals: walk without pain.      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    Idiopathic peripheral neuropathy 03/15/2015    Spondylosis of cervical region without myelopathy or radiculopathy 05/28/2014    Back pain, lumbosacral 02/15/2013    B12 deficiency 12/21/2012     Borderline low 5 /21/2014    Chronic renal insufficiency, stage III (moderate) ([place]) 12/21/2012    5/ 2013    Hyperlipidemia 09/26/2012    Cervicalgia 03/11/2010    Radiofrequency ablation of c3 to c 7    Allergic rhinitis due to allergen     Carotid Sinus Hypersensitivity     Disc disorder of lumbosacral region     Dizziness     Heart murmur     Hip injury     HYPERTENSION      Nosebleed     Stroke [facility])     Syncope     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([place])         Review of patient's allergies indicates:  Allergies   Allergen Reactions    Adhesives Skin: Rash     Glue on lido patches    Bee Venom Skin: Hives, Skin: Itching and Swelling    Gabapentin Other     Flu like symptoms, diarrhea, body ache upset stomach    Methocarbamol Other     Patient's wife reports cognitive difficulties ("goofy")        Medications:  Outpatient Medications Prior to Visit   Medication Sig Dispense Refill    Acetaminophen 500 MG Oral Tab Take 2 tablets (1,000 mg) by mouth 2 times a day.      Alpha-Lipoic Acid 300 MG tablet Take 1 tablet by mouth daily.      amLODIPine 5 MG tablet Take 1 tablet (5 mg) by mouth 2 times a day. 180 tablet 3    atorvastatin 10 MG tablet Take 1 tablet (10 mg) by mouth daily. 90 tablet 3    BENFOTIAMINE OR Take 250 mg by mouth daily.      betamethasone dipropionate 0.05 % lotion Apply topically 2 times a day. Apply to scalp and ears as needed for itching 60 mL 11    Cholecalciferol (VITAMIN D3) 2000 units Oral Cap Take 1 capsule (2,000 Units) by mouth daily. For low level 1 capsule 1    CLINDAMYCIN HCL OR Take by mouth. CLINDAMYCIN CREAM      Cyanocobalamin 1000 MCG Oral Tab one per day over-the-counter started 5/21 /2012 this level borderline low 1 Tab 1    diclofenac 1 % gel Apply 4 g topically 4 times a day. Apply to 2 gram four times daily to neck, trapezius muscle, and low back. Use a max of 32 grams a day. 300 g 4    DULoxetine 60 MG DR capsule Take 1 capsule (60 mg) by mouth 2 times a day. 180 capsule 2     EPINEPHrine 0.3 MG/0.3ML auto-injector INJECT INTRAMUSCULARLY ONCE AS NEEDED FOR SEVERE ALLERGIC REACTION 4 each 3    ibuprofen 200 MG tablet Take 1 tablet (200 mg) by mouth daily as needed (pain).      lidocaine 5 % patch Apply 1 patch onto the skin daily. Apply to painful area for up to 12 hours in a 24 hour period. 30 patch 2    lisinopril 20 MG tablet Take 1 tablet (20 mg) by mouth 2 times a day. 180 tablet 3    naltrexone 50 MG tablet TAKE 1 TABLET BY MOUTH DAILY WITH FOOD 30 tablet 0    Omega-3 Fatty Acids (OMEGA 3 OR) Take 1 tablet by mouth daily.      pregabalin 300 MG capsule Take 1 capsule (300 mg) by mouth 2 times a day. 180 capsule 1    primidone 50 MG tablet Take 1 tablet (50 mg) by mouth 2 times a day. 180 tablet 4     No facility-administered medications prior to visit.        PSH:   Past Surgical History:   Procedure Laterality Date    L meniciscus Left 07/2017    by Dr Ronnie Mason , re did left menisicus     PR ANES; COLONOSCOPY  2002    repeat in 3 years    PR ANES; COLONOSCOPY & POLYPECTOMY  11/18/2007    repeat in 3 years    PR HALLUX RIGIDUS W/CHEILECTOMY 1ST MP JT W/O IMPLT Right 10/21/2017    Dr. Megan Mason    PR UNLISTED PROCEDURE FEMUR/KNEE      PR UNLISTED PROCEDURE HANDS/FINGERS      PR UNLISTED PROCEDURE SPINE  2013    rfa to c3 to c 7     TOE SURGERY Right 02/09/2019    toe surgery  Right 01/12/2019    Dr Ronnie Mason         Family History:  family history includes Colon Cancer in his father; Heart (other) in his mother; Other Family Hx  in an other family member. There is no history of Cancer.    Social History:  Social History     Socioeconomic History    Marital status: Married     Spouse name: Not on file    Number of children: Not on file    Years of education: Not on file    Highest education level: Not on file   Occupational History    Not on file   Tobacco Use    Smoking status: Never    Smokeless tobacco: Current     Types: Chew    Tobacco comments:     Sometimes chews, but is trying  to quit. 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  Non-smoker, 14 or more drinks/week almost exclusively wine with dinner.            Social Determinants of Health     Financial Resource Strain: Not on file   Food Insecurity: Not on file   Transportation Needs: Not on file   Physical Activity: Not on file   Stress: Not on file   Social Connections: Not on file   Intimate Partner Violence: Not on file   Housing Stability: Not on file        Review of Systems   Constitutional: Negative    Eyes: Deferred   Ears, Nose, Mouth, Throat: Deferred   Cardiovascular: Negative    Respiratory: Negative    Gastrointestinal: Negative   Genitourinary: Deferred   Musculoskeletal: As noted in HPI above   Skin: Negative    Neurological: Negative    Psychiatric: Deferred   Endocrine: Negative    Hematologic/Lymphatic: Negative   Allergic/Immunologic: Deferred     Physical Exam:   There were no vitals filed for this visit.     General: The patient is 71 year old year old male White  with constitutional symptom: appears age stated.    Psychiatric/mood: normal mood and affect.    Neurological: alert and oriented x3.    Head/face:    normocephalic, atraumatic, no lesions    Eyes:  PERRL/EOMI    Ears, Nose, Mouth and Throat:   Hearing:  normal. Mouth:  Lips, gums, tongue and oral mucosa are normal.  Teeth appear healthy..    Neck and Thyroid:  normal,supple,nontender to palpation,no adenopathy     Heart:  regular rate and rhythm, no murmurs, rubs or gallops    Respiratory:   clear to ausculation nl resp  effort    Abdomen:  soft, normoactive bowel sounds, and nontender    LOWER EXTREMITIES EXAM:  Vascular:   RIGHT:  Dorsalis pedis pulse is palpable.  Posterior tibial pulse is palpable. Capillary filling time note to be immediate on the distal hallux.      Neurologic:  RIGHT:  The sensation is intact on the foot/ankle.  Motor coordination is intact with normal muscle tone.  There  is no manifestations of pathological reflexes noted.      Dermatological:   RIGHT:   Upon inspection, the skin note to have normal texture/tone/turgor.  There is no ulceration/open wound or dermatosis present.  Integument coloration is within normal range and temperature is within normal range.  The hair growth is normal.       Musculoskeletal:   RIGHT:   There is rigid mallet toe deformity on 2nd toe and 3rd toe.  There is mild pain at dorsal DIPJ.  There is redness/erythema noted.  There is no contracture of the 2nd and 3rd MTPJ.             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 The patient elected to schedule for surgery.  We'll plan for the following procedure(s):      Procedure(s):                           Mallet toe correction(28285), 2nd toe RIGHT .   Mallet toe correction(28285) 3rd toe, RIGHT .      Instrumentation:  [employer] Synthes - headless screw 2.5 CCHS small titanium.     Detailed pre & post-operative instructions and recommendation to given.      The weight bearing status will be: protective weight bearing with long cast boot/camwalker using following modality to assist ambulation: cane.     Is patient able to use crutches:  Yes.  Is patient stable on non-surgical foot:  Yes.    Patient was instructed not to drive if operated foot is driving foot.   Patient was educated about  elevating and icing the surgical foot to control pain and swelling.  And patient was instructed to keep the dressing dry and not to remove the dressing.  Preoperative Hibiclens bathing instructions and soap was given.      Rx dispensed: Oxycodone 5 mg.   Patient was instructed not to drive while on opioid until affect was realize.      DME: A long camwalker / cast boot - has at home    Anesthesia: Monitored anesthesia care(MAC) however final determination will be referred to anesthesiologist.  Patient position:  supine    Consent:  The patient has been advised of the approximate disability involved for these procedures.  In addition, the patient has been advised as to the alternatives of care, including continued conservative care as well as surgical procedures.  The patient understands that if surgical procedures are performed, there are risks and complications that could occur, including but not limited to: hematoma formation, development of a DVT or phlebitis, infection, painful scar tissue formation, limited motion, delayed-union, non-union, mal-union, reaction to implanted biomaterials, over-correction, under-correction with recurrence of the deformities, CRPS, continued pain, and the possibility that future surgery may need to be performed.  The patient was given the opportunity to ask questions which were answered to the best of my ability.  The patient voiced no concerns, will consider all these options, and schedule accordingly.  No guarantee given.     Pre-op medical clearance:  PCP.    Pre-operative decision:   1.  DVT risk factors are minimal.  The preventive measure  for DVT are regular leg/ankle exercises with compression sock was ordered and early mobilization with/without weight bearing and none - patient is ambulating.    2.  Prophylaxis for cardiac events with perioperative beta-blockers:  No.    3.  Pre-op labs:  Patient's age: 71 year old yo, BMI: 23 (Age 15-74 - ECG, Age > 63 - CBC/BMP, BMI >40 &  poor exercise tolerance - ECG, Diuretic ACEI or ARB - BMP, DM - ECG, BMP, A1c): No.    4.  Prophylaxis for infection:  4% CHG E-Z scrub surgical scrub bottle was dispensed prepping the surgical extremity where patient was instructed scrub the extremity night before and morning of.   Additionally, the surgical extremity will be scrub at the hospital twice at [facility] and in OR room just before surgery.  Patient will also receive pre-operative IV antibiotic and possibly 2nd dose of IV antibiotic if needed.      5.  Change in medication regimen before surgery:  Discontinue NSAIDS/ASA and supplements 7 days prior to surgery.       6.  Post-op steroid coverage:  No.     7.  Metabolic/bone healing risk factors:  No.  Current he is taking Vit D 2000 u daily.     8.  If patient diabetic:  No,     Return appointment: follow up in 1-2 weeks for post op check    Vira Browns DPM, FACFAS  Reconstructive Ankle and Foot Surgeon  [facility]

## 2022-04-30 NOTE — Progress Notes (Signed)
Accompanied by:  wife     The patient states that the condition has existed for many, year(s) and began gradually.    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    The precipitating event was none.    Patient describes the symptoms as  sore and painful  .    Patient denies  injury .    The quality of pain aching, sharp, and throbbing.    The level the pain moderate.    The condition is not getting better.    The affected area is made worse by  activity .    Past treatments/studies include nothing .    The patient states no improvement with treatment.    Previous diagnostic test/evaluation:  x-ray.    Patient's limitation is activities are limited in walking and limited in recreation.      Goals: walk without pain.

## 2022-04-30 NOTE — Patient Instructions (Signed)
PREOPERATIVE DISCUSSION:    Planned procedure(s):    Mallet toe correction 2nd toe RIGHT .   Mallet toe correction 3rd toe, RIGHT .     The purpose of these procedure(s): address the mallet toe.      Alternative treatments: live with current condition.    Before surgery:    STOP taking aspirin or any anti-inflammatory (i.e. Advil (ibuprofen), Aleve, Excedrin, Orudis, naproxen, etc), two weeks prior to your surgery.  These medications can cause increased bleeding at the time of surgery and increase the time it takes to perform the surgery.  This can also lead to increased scar tissue post-operatively.  Tylenol is ok if you need to take something for aches and pains prior to surgery.  If you are on coumadin (blood thinner), typically this should be stopped approximately 3 to 5 days prior to surgery.  Check with your Primary Care Physician.     Pre-operative leg scrub instructions:  The scrubs are to help prevent infection.  Apply the Hibiclens with a washcloth.   The evening before and morning of your surgery use Hibiclens soap/washcloth to scrub your foot and lower leg from the knee down.  We will typically provide you with a Betadine / Hibiclens soap.  For the morning scrub use the same remaining soap.  Pay particular attention to the toenails.  Make sure they are trimmed and clean.  Clean well between your toes.  Rinse with water and towel dry, especially between the toes.  (If you are allergic to Iodine make sure to inform your doctor and use Hibiclens instead of the iodine.  This should be available at your local drug store.  The scrubs are to help prevent infection.    Do not eat after midnight the night before your surgery.  You may drink small amount water (only) up to four hours before you are supposed to come in for surgery.    You should take your normal morning medications with a sip of water with the following exceptions:  1) Do not take your insulin or oral diabetic medications unless otherwise  instructed by your Primary Care Physician or by the Nurse that calls you the day or so prior to the surgery from the hospital or clinic.  If you have asthma or uses any inhalers, please bring your inhalers to the hospital or surgical center.    Herbs and "natural" medicine such as ginkgo biloba, fish oil, feverfew, garlic, ginger, ginseng, Flax-seed oil, dong quai (Angelica sinensis),) and danshen (Salvia miltiorrhiza.),[place], Primrose oil, Chondroitin glucosamine and villains (valerian, kava kava and St. John's wort) should be stopped two weeks prior to surgery since these will affect bleeding and anesthesia. The following medications are uncommon but should be discontinued two weeks prior to your surgery:  Nardil (Phenelzine), Parnate (Tranylcyperonine).  Exceptions can be made but this should be discussed with your Primary Care Physician.      A Nurse from the hospital, our surgical center or clinic (depending on where you are having your surgery) will call a day or so prior to your surgery and inform you what time to be at the facility.  She / He will also be asking other questions regarding medications, allergies, etc.    Dietary Note:  You should avoid using additional salt on your food prior to your surgery for one week.  Salt can cause increased swelling post-operatively and this can lead to increased discomfort.  It is also important to avoid additional salt during  your recovery period for the same reason.    No pedicures 3 weeks prior to surgery.  If you do get a pedicure prior to 3 weeks, watch for redness and signs of infection.  Let our office know immediately if you see any signs of infection.      Weight bearing status:  protective weight bearing using following modality to assist ambulation: camboot/cast boot.   Do not to drive if operated foot is driving foot.      Dressing:  Keep the dressing dry and not to remove the dressing until 1st post-op visit.      Pain management:  -  Primary -  Rest, ice, elevation - 8-10 inches above the heart (every 1 hour elevation/5 minutes of down time for couple weeks during acute inflammatory phase), compression as primary care for pain and swelling.    -  Rx dispensed: Oxycodone 5 mg.    -  To avoid constipation:  take extra fluid/water, add fruit/vegetables to diet, eat prunes, and consider laxative or stool softener (docusate sodium -colace) if persist call your doctor.   -  Consider extra strength Tylenol for pain.  Maximum in 24 hours is 4000mg  (8 Extra Strength tablets or 12 Regular Strengthtablets)    Anesthesia:  General anesthesia     POSSIBLE RISKS INHERENT TO ANY SURGERY:  Infection  development of a DVT(blood clot) or phlebitis  Excessive bleeding - hematoma formation and swelling  painful scar tissue formation  Numbness and nerve entrapment  limited motion  delayed-union , non-union and/or mal-union  reaction to implanted biomaterials  over-correction, under-correction with recurrence of the deformities  CRPS (severe nerve pain)  continued pain  possibility that future surgery may need to be performed  Amputation/death  General reaction to anesthesia/death  No guarantee given.     Pre-op medical clearance:  PCP.    Pre-operative risk factors:   1.  A blood clot on your leg (DVT) or a clot travel to your lung (PE) is a serious potential complication during your recovery.   Although the risk is low for DVT/PE (Blood clot) for foot and ankle surgery, we recommend preventive measures.   Based on our assessment, the preventive measure for DVT are regular leg/ankle exercises with compression sock and early mobilization with/without weight bearing and none - patient is ambulating.    2. Prophylaxis for infection:  4% CHG E-Z scrub surgical scrub bottle is given for prepping the surgical extremity where patient was instructed scrub the extremity night before and morning of.   Additionally, the surgical extremity will be scrub at the hospital twice at [facility] and in  OR room just before surgery.  Patient will also receive pre-operative IV antibiotic and possibly 2nd dose of IV antibiotic if needed.      3.  Changes in medication regimen before surgery:  Discontinue NSAIDS/ASA and supplements 7 days prior to surgery.     Preventing Deep Vein Thrombosis After Surgery  In the days and weeks after surgery, you have a higher chance of developing a deep vein thrombosis (DVT). This is a condition in which a blood clot or thrombus develops in a deep vein. They are most common in the leg. But, a DVT may develop in an arm, or another deep vein in the body. A piece of the clot, called an embolus, can separate from the vein and travel to the lungs. A blood clot in the lungs is called a pulmonary embolus (PE). This can cut off  the flow of blood to the lungs. It is a medical emergency and may cause death.    Healthcare providers use the term venous thromboembolism (VTE) to describe both DVT and PE. They use the term VTE because the two conditions are very closely related. And, because their prevention and treatment are closely related.      A patient with a sequential compression device.     Prevention in the hospital or other facility  Your healthcare provider will usually prescribe one or more of the following to prevent blood clots:  Anticoagulant. This is medicine that prevents blood clots. You take it by mouth, by injection, or through an IV. Commonly used anticoagulants include warfarin and heparin. Newer anticoagulants may also be used, including rivaroxaban, apixaban, dabigatran and enoxaparin. Sometimes your healthcare provider may not give you an anticoagulant medicine. It is important that he or she discuss the risks and benefits with you, and document them.   Compression stockings. These are elastic stockings that fit tightly around your legs. They help keep blood flowing toward your heart by the pressure they apply. They prevent blood from pooling and forming blood clots. When  you first put them on, the stockings may be uncomfortable. But after a while, you should get used to them.   Exercises. Simple exercises while you are resting in bed or sitting in a chair can help prevent blood clots. Move your feet in a circle or up and down. Do this 10 times an hour to improve circulation.  Ambulation (getting out of bed and walking). After surgery, a nurse will help you out of bed, as soon as you are able. Moving around improves circulation and helps prevent blood clots.  Sequential compression device (SCD) or intermittent pneumatic compression (IPC). Plastic sleeves are wrapped around your legs and connected to a pump that inflates and deflates the sleeves. This applies gentle pressure to promote blood flow in the legs and prevent blood clots. Remove the sleeves so that you do not trip or fall when you are walking. For example, when you use the bathroom or shower. If you need help removing the sleeves, ask for help.    Prevention at home     Ankle exercises can help keep blood flowing in the veins.     Deep vein thrombosis can happen even after you go home. Follow all instructions from your healthcare provider. The following are some general guidelines about DVT prevention:   Anticoagulant medicine. If an anticoagulant was prescribed, make sure you follow all directions about taking it. Be sure you know what foods and  medicines may interact. Also, ask your healthcare provider what to do if you forget to take a dose.  Compression stockings. Your healthcare provider will tell you how often to wear and remove the stockings. Follow all instructions closely. Each time you remove your stockings, check your legs and feet for reddened areas or sores. If you see any changes, call your healthcare provider right away.  Returning to activity. Follow all instructions about returning to activities. Be as active as you can. This improves blood flow and helps prevent a clot from forming. When  in bed or in a  chair, continue with the ankle exercises you did in the hospital.  Sequential compression device (SCD) or intermittent pneumatic compression (IPC). In certain situations, this device may be recommended at home. If you are using this device at home, make sure you closely follow all instructions from your healthcare provider. You will  be instructed on how often and for how long to use the device. Again, remove the sleeves if you are up and walking.    When to get help  You may have signs or symptoms of a blood clot. Or, you may have signs or symptoms of bleeding from medicines to prevent clots.  Call 911 if you have the following:  Chest pain  Shortness of breath  Fast heartbeat  Excessive sweating  Fainting  Coughing (may cough up blood)  Heavy or uncontrolled bleeding    Call your healthcare provider if you have the following:  Pain, swelling, or redness in the leg, arm, or other area  Blood in the urine or stool  Very dark or tar-like stool  Vomiting with blood  Bleeding from the nose  Bleeding from the gums  A cut that will not stop bleeding  Bleeding from the vagina     Instructions: Using Crutches (Non-Weight-Bearing)  Your healthcare provider has prescribed crutches for you. A healthy leg can support your body weight, but when you have an injured leg or foot, you need to keep weight off it. The "swing to" method of walking, sometimes called gait, is easy to learn and takes less arm strength and balance. The "swing through" gait takes more practice, but it moves you farther with each step and is less tiring overall. Start with "swing to" and progress to "swing through" when instructed.      Before you use crutches  Be prepared:  Remove throw rugs, electrical cords, and anything else that may cause you to fall.  Arrange your household to keep the items you need handy. Keep everything else out of the way.  Find a backpack, fanny pack, or apron, or use pockets to carry things. This will help you keep your hands  free.  Standing with crutches  Use the balanced standing (tripod) position when you start or end a movement. Also use it whenever you're standing for a length of time.  Move your crutches in front of you about 12 inches.  Hold the injured (or weaker) foot off the floor.  Find your balance.  Be sure not to rest your armpits on the pads of the crutches.  Walking with crutches  Tips include the following:   Start in a balanced standing (tripod) position.  Squeeze the crutch pads against the sides of your chest.  The bottom tips of the crutches should be wide enough apart for you to move easily between them.  Support your weight on your hands, not on your armpits.  Swing to gait  With your crutches in front of you, press down on the handgrips.  Lift your good (stronger) foot and swing your body up to the crutches.  Land on your good foot, between your crutches.  Keep the knee of your injured or weaker foot slightly bent.  Reach forward and out with the crutches to begin the next step.  Swing through gait  With your crutches in front of you, press down on the handgrips.  Lift your good (stronger) foot and swing your body through the crutches.  Land on your good foot, about 12 inches in front of the crutches.  Keep the knee of your injured leg slightly bent.  Reach forward and out with the crutches to begin the next step.     PLEASE CALL WITH ANY QUESTIONS:  [phone]

## 2022-04-30 NOTE — Progress Notes (Signed)
Patient: Ronnie Mason   Patient DOB: Jun 30, 1951     DOS:  04/30/2022     Accompanied by:  patient was unaccompanied    Chief Complaint: Ronnie Mason is a 71 year old year old male who presents today for evaluation of painful mallet toe on right 2nd/3rd toe.  After discussion, patient elected for surgical care, RIGHT.       History of present Illness:    (history is obtained by MA/RN and edited by Dr. Maudie Mercury)  The patient states that the condition has existed for many, year(s) and began gradually.    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    The precipitating event was none.    Patient describes the symptoms as  sore and painful  .    Patient denies  injury .    The quality of pain aching, sharp, and throbbing.    The level the pain moderate.    The condition is not getting better.    The affected area is made worse by  activity .    Past treatments/studies include nothing .    The patient states no improvement with treatment.    Previous diagnostic test/evaluation:  x-ray.    Patient's limitation is activities are limited in walking and limited in recreation.       Goals: walk without pain.      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    Idiopathic peripheral neuropathy 03/15/2015    Spondylosis of cervical region without myelopathy or radiculopathy 05/28/2014    Back pain, lumbosacral 02/15/2013    B12 deficiency 12/21/2012     Borderline low 5 /21/2014    Chronic renal insufficiency, stage III (moderate) ([place]) 12/21/2012    5/ 2013    Hyperlipidemia 09/26/2012    Cervicalgia 03/11/2010    Radiofrequency ablation of c3 to c 7    Allergic rhinitis due to allergen     Carotid Sinus Hypersensitivity     Disc disorder of lumbosacral region     Dizziness     Heart murmur     Hip injury     HYPERTENSION      Nosebleed     Stroke [facility])     Syncope     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([place])         Review of patient's allergies indicates:  Allergies   Allergen Reactions    Adhesives Skin: Rash     Glue on lido patches    Bee Venom Skin: Hives, Skin: Itching and Swelling    Gabapentin Other     Flu like symptoms, diarrhea, body ache upset stomach    Methocarbamol Other     Patient's wife reports cognitive difficulties ("goofy")        Medications:  Outpatient Medications Prior to Visit   Medication Sig Dispense Refill    Acetaminophen 500 MG Oral Tab Take 2 tablets (1,000 mg) by mouth 2 times a day.      Alpha-Lipoic Acid 300 MG tablet Take 1 tablet by mouth daily.      amLODIPine 5 MG tablet Take 1 tablet (5 mg) by mouth 2 times a day. 180 tablet 3    atorvastatin 10 MG tablet Take 1 tablet (10 mg) by mouth daily. 90 tablet 3    BENFOTIAMINE OR Take 250 mg by mouth daily.      betamethasone dipropionate 0.05 % lotion Apply topically 2 times a day. Apply to scalp and ears as needed for itching 60 mL 11    Cholecalciferol (VITAMIN D3) 2000 units Oral Cap Take 1 capsule (2,000 Units) by mouth daily. For low level 1 capsule 1    CLINDAMYCIN HCL OR Take by mouth. CLINDAMYCIN CREAM      Cyanocobalamin 1000 MCG Oral Tab one per day over-the-counter started 5/21 /2012 this level borderline low 1 Tab 1    diclofenac 1 % gel Apply 4 g topically 4 times a day. Apply to 2 gram four times daily to neck, trapezius muscle, and low back. Use a max of 32 grams a day. 300 g 4    DULoxetine 60 MG DR capsule Take 1 capsule (60 mg) by mouth 2 times a day. 180 capsule 2     EPINEPHrine 0.3 MG/0.3ML auto-injector INJECT INTRAMUSCULARLY ONCE AS NEEDED FOR SEVERE ALLERGIC REACTION 4 each 3    ibuprofen 200 MG tablet Take 1 tablet (200 mg) by mouth daily as needed (pain).      lidocaine 5 % patch Apply 1 patch onto the skin daily. Apply to painful area for up to 12 hours in a 24 hour period. 30 patch 2    lisinopril 20 MG tablet Take 1 tablet (20 mg) by mouth 2 times a day. 180 tablet 3    naltrexone 50 MG tablet TAKE 1 TABLET BY MOUTH DAILY WITH FOOD 30 tablet 0    Omega-3 Fatty Acids (OMEGA 3 OR) Take 1 tablet by mouth daily.      pregabalin 300 MG capsule Take 1 capsule (300 mg) by mouth 2 times a day. 180 capsule 1    primidone 50 MG tablet Take 1 tablet (50 mg) by mouth 2 times a day. 180 tablet 4     No facility-administered medications prior to visit.        PSH:   Past Surgical History:   Procedure Laterality Date    L meniciscus Left 07/2017    by Dr Matthew Saras , re did left menisicus     PR ANES; COLONOSCOPY  2002    repeat in 3 years    PR ANES; COLONOSCOPY & POLYPECTOMY  11/18/2007    repeat in 3 years    PR HALLUX RIGIDUS W/CHEILECTOMY 1ST MP JT W/O IMPLT Right 10/21/2017    Dr. Megan Salon    PR UNLISTED PROCEDURE FEMUR/KNEE      PR UNLISTED PROCEDURE HANDS/FINGERS      PR UNLISTED PROCEDURE SPINE  2013    rfa to c3 to c 7     TOE SURGERY Right 02/09/2019    toe surgery  Right 01/12/2019    Dr Wilmon Arms         Family History:  family history includes Colon Cancer in his father; Heart (other) in his mother; Other Family Hx  in an other family member. There is no history of Cancer.    Social History:  Social History     Socioeconomic History    Marital status: Married     Spouse name: Not on file    Number of children: Not on file    Years of education: Not on file    Highest education level: Not on file   Occupational History    Not on file   Tobacco Use    Smoking status: Never    Smokeless tobacco: Current     Types: Chew    Tobacco comments:     Sometimes chews, but is trying  to quit. 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  Non-smoker, 14 or more drinks/week almost exclusively wine with dinner.            Social Determinants of Health     Financial Resource Strain: Not on file   Food Insecurity: Not on file   Transportation Needs: Not on file   Physical Activity: Not on file   Stress: Not on file   Social Connections: Not on file   Intimate Partner Violence: Not on file   Housing Stability: Not on file        Review of Systems   Constitutional: Negative    Eyes: Deferred   Ears, Nose, Mouth, Throat: Deferred   Cardiovascular: Negative    Respiratory: Negative    Gastrointestinal: Negative   Genitourinary: Deferred   Musculoskeletal: As noted in HPI above   Skin: Negative    Neurological: Negative    Psychiatric: Deferred   Endocrine: Negative    Hematologic/Lymphatic: Negative   Allergic/Immunologic: Deferred     Physical Exam:   There were no vitals filed for this visit.     General: The patient is 71 year old year old male White  with constitutional symptom: appears age stated.    Psychiatric/mood: normal mood and affect.    Neurological: alert and oriented x3.    Head/face:    normocephalic, atraumatic, no lesions    Eyes:  PERRL/EOMI    Ears, Nose, Mouth and Throat:   Hearing:  normal. Mouth:  Lips, gums, tongue and oral mucosa are normal.  Teeth appear healthy..    Neck and Thyroid:  normal,supple,nontender to palpation,no adenopathy     Heart:  regular rate and rhythm, no murmurs, rubs or gallops    Respiratory:   clear to ausculation nl resp  effort    Abdomen:  soft, normoactive bowel sounds, and nontender    LOWER EXTREMITIES EXAM:  Vascular:   RIGHT:  Dorsalis pedis pulse is palpable.  Posterior tibial pulse is palpable. Capillary filling time note to be immediate on the distal hallux.      Neurologic:  RIGHT:  The sensation is intact on the foot/ankle.  Motor coordination is intact with normal muscle tone.  There  is no manifestations of pathological reflexes noted.      Dermatological:   RIGHT:   Upon inspection, the skin note to have normal texture/tone/turgor.  There is no ulceration/open wound or dermatosis present.  Integument coloration is within normal range and temperature is within normal range.  The hair growth is normal.       Musculoskeletal:   RIGHT:   There is rigid mallet toe deformity on 2nd toe and 3rd toe.  There is mild pain at dorsal DIPJ.  There is redness/erythema noted.  There is no contracture of the 2nd and 3rd MTPJ.             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 The patient elected to schedule for surgery.  We'll plan for the following procedure(s):      Procedure(s):                           Mallet toe correction(28285), 2nd toe RIGHT .   Mallet toe correction(28285) 3rd toe, RIGHT .      Instrumentation:  [employer] Synthes - headless screw 2.5 CCHS small titanium.     Detailed pre & post-operative instructions and recommendation to given.      The weight bearing status will be: protective weight bearing with long cast boot/camwalker using following modality to assist ambulation: cane.     Is patient able to use crutches:  Yes.  Is patient stable on non-surgical foot:  Yes.    Patient was instructed not to drive if operated foot is driving foot.   Patient was educated about  elevating and icing the surgical foot to control pain and swelling.  And patient was instructed to keep the dressing dry and not to remove the dressing.  Preoperative Hibiclens bathing instructions and soap was given.      Rx dispensed: Oxycodone 5 mg.   Patient was instructed not to drive while on opioid until affect was realize.      DME: A long camwalker / cast boot - has at home    Anesthesia: Monitored anesthesia care(MAC) however final determination will be referred to anesthesiologist.  Patient position:  supine    Consent:  The patient has been advised of the approximate disability involved for these procedures.  In addition, the patient has been advised as to the alternatives of care, including continued conservative care as well as surgical procedures.  The patient understands that if surgical procedures are performed, there are risks and complications that could occur, including but not limited to: hematoma formation, development of a DVT or phlebitis, infection, painful scar tissue formation, limited motion, delayed-union, non-union, mal-union, reaction to implanted biomaterials, over-correction, under-correction with recurrence of the deformities, CRPS, continued pain, and the possibility that future surgery may need to be performed.  The patient was given the opportunity to ask questions which were answered to the best of my ability.  The patient voiced no concerns, will consider all these options, and schedule accordingly.  No guarantee given.     Pre-op medical clearance:  PCP.    Pre-operative decision:   1.  DVT risk factors are minimal.  The preventive measure  for DVT are regular leg/ankle exercises with compression sock was ordered and early mobilization with/without weight bearing and none - patient is ambulating.    2.  Prophylaxis for cardiac events with perioperative beta-blockers:  No.    3.  Pre-op labs:  Patient's age: 71 year old yo, BMI: 10 (Age 29-74 - ECG, Age > 32 - CBC/BMP, BMI >40 &  poor exercise tolerance - ECG, Diuretic ACEI or ARB - BMP, DM - ECG, BMP, A1c): No.    4.  Prophylaxis for infection:  4% CHG E-Z scrub surgical scrub bottle was dispensed prepping the surgical extremity where patient was instructed scrub the extremity night before and morning of.   Additionally, the surgical extremity will be scrub at the hospital twice at [facility] and in OR room just before surgery.  Patient will also receive pre-operative IV antibiotic and possibly 2nd dose of IV antibiotic if needed.      5.  Change in medication regimen before surgery:  Discontinue NSAIDS/ASA and supplements 7 days prior to surgery.       6.  Post-op steroid coverage:  No.     7.  Metabolic/bone healing risk factors:  No.  Current he is taking Vit D 2000 u daily.     8.  If patient diabetic:  No,     Return appointment: follow up in 1-2 weeks for post op check    Vira Browns DPM, FACFAS  Reconstructive Ankle and Foot Surgeon  [facility]

## 2022-05-02 LAB — R/O MRSA

## 2022-05-04 ENCOUNTER — Encounter (INDEPENDENT_AMBULATORY_CARE_PROVIDER_SITE_OTHER): Payer: Medicare PPO | Admitting: Podiatrist

## 2022-05-05 ENCOUNTER — Telehealth (INDEPENDENT_AMBULATORY_CARE_PROVIDER_SITE_OTHER): Payer: Self-pay

## 2022-05-05 NOTE — Telephone Encounter (Signed)
Patient is due for Medicare AWV.    Mychart message sent to schedule AWV.    Please contact patient to schedule AWV.    Thanks!

## 2022-05-06 ENCOUNTER — Encounter (HOSPITAL_COMMUNITY): Payer: Self-pay

## 2022-05-06 ENCOUNTER — Encounter (INDEPENDENT_AMBULATORY_CARE_PROVIDER_SITE_OTHER): Payer: Self-pay

## 2022-05-07 ENCOUNTER — Encounter (HOSPITAL_COMMUNITY): Payer: Self-pay

## 2022-05-07 ENCOUNTER — Other Ambulatory Visit (HOSPITAL_BASED_OUTPATIENT_CLINIC_OR_DEPARTMENT_OTHER): Payer: Self-pay | Admitting: Psychiatry

## 2022-05-07 ENCOUNTER — Other Ambulatory Visit: Payer: Self-pay

## 2022-05-07 ENCOUNTER — Ambulatory Visit: Payer: Medicare PPO

## 2022-05-07 DIAGNOSIS — F1020 Alcohol dependence, uncomplicated: Secondary | ICD-10-CM

## 2022-05-07 NOTE — Anesthesia Preprocedure Evaluation (Addendum)
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  Detailed education and discussion took place with the patient regards to mallet toe deformity of 2nd toe and 3rd toe, right.  Discussion included spectrum of treatments available such as continue with current care and surgical correction.   After the discussion, today patient elected surgical correction.      Relevant Problems   Neuro/Psych   (+) Syncope      Cardio   (+) Essential hypertension   (+) Hyperlipidemia      GI/Hepatic/Renal   (+) Chronic renal insufficiency, stage III (moderate) (HCC)      Vascular Surgery   (+) Carotid Sinus Hypersensitivity     Relevant surgical history:   Past Surgical History:   Procedure Laterality Date    L meniciscus Left 07/2017    by Dr Matthew Saras , re did left menisicus     PR ANES; COLONOSCOPY  2002    repeat in 3 years    PR ANES; COLONOSCOPY & POLYPECTOMY  11/18/2007    repeat in 3 years    PR HALLUX RIGIDUS W/CHEILECTOMY 1ST MP JT W/O IMPLT Right 10/21/2017    Dr. Megan Salon    PR UNLISTED PROCEDURE FEMUR/KNEE      PR UNLISTED PROCEDURE HANDS/FINGERS      PR UNLISTED PROCEDURE SPINE  2013    rfa to c3 to c 7     SURGICAL HX OTHER Right 12/03/2020    RIGHT foot WOUND INCISION AND DRAINAGE    TOE SURGERY Right 02/09/2019    toe surgery  Right 01/12/2019    Dr Wilmon Arms          Medications:     Outpatient:   Current Outpatient Medications   Medication Instructions    acetaminophen (TYLENOL) 1,000 mg, Oral, 2 times daily    Alpha-Lipoic Acid 300 MG tablet 1 tablet, Oral, Every morning, Benfotiamine- Alpha Lipoic acid     amLODIPine (NORVASC) 5 mg, Oral, 2 times daily    atorvastatin  (LIPITOR) 10 mg, Oral, Daily    BENFOTIAMINE OR 250 mg, Oral, Every morning    betamethasone dipropionate 0.05 % lotion Topical, 2 times daily, Apply to scalp and ears as needed for itching    clindamycin 1 % gel 1 application , Topical, 2 times daily PRN, Apply to back.    CLINDAMYCIN HCL OR Oral, CLINDAMYCIN CREAM    Cyanocobalamin 1000 MCG Oral Tab one per day over-the-counter started 5/21 /2012 this level borderline low    diclofenac (VOLTAREN) 4 g, Topical, 4 times daily, Apply to 2 gram four times daily to neck, trapezius muscle, and low back. Use a max of 32 grams a day.    DULoxetine (CYMBALTA) 60 mg, Oral, 2 times daily    EPINEPHrine 0.3 MG/0.3ML auto-injector INJECT INTRAMUSCULARLY ONCE AS NEEDED FOR SEVERE ALLERGIC REACTION    ibuprofen (MOTRIN) 200 mg, Oral, Daily PRN    lidocaine 5 % patch 1 patch, Transdermal, Daily, Apply to painful area for up to  12 hours in a 24 hour period.    lisinopril (PRINIVIL; ZESTRIL) 20 mg, Oral, 2 times daily    naltrexone 50 MG tablet TAKE 1 TABLET BY MOUTH DAILY WITH FOOD    Omega-3 Fatty Acids (OMEGA 3 OR) 1 tablet, Oral, Every morning    oxyCODONE 5 mg, Oral, Every 6 hours PRN    pregabalin (LYRICA) 300 mg, Oral, 2 times daily    primidone (MYSOLINE) 50 mg, Oral, 2 times daily    Vitamin D3 2,000 units, Oral, Daily, For low level                Review of patient's allergies indicates:  Allergies   Allergen Reactions    Adhesives Skin: Rash     Glue on lido patches    Bee Venom Skin: Hives, Skin: Itching and Swelling    Gabapentin Other     Flu like symptoms, diarrhea, body ache upset stomach    Methocarbamol Other     Patient's wife reports cognitive difficulties ("goofy")       Social History:   Social History     Tobacco Use    Smoking status: Never    Smokeless tobacco: Never    Tobacco comments:     Sometimes chews, but is trying to quit. 11/28/21   Substance Use Topics    Alcohol use: Yes     Alcohol/week: 7.0 standard drinks     Types: 7 Glasses of wine per week      Comment: goal to get down to 2 per day    Drug use: Yes     Frequency: 3.0 times per week     Types: Marijuana     Comment: CBD (Occassionally)       Medical History and Review of Systems      Source of information: Phone evaluation.  Previous anesthesia: Yes   History of anesthetic complications  (-) History of anesthetic complications.  (-) family history of anesthetic complications.      Functional Status   Able to climb 2 flights of stairs or more without stopping, able to lay flat and still for 30 minutes and able to walk 1 city block (200 yards).       Pulmonary Had Covid 19 last Dec.2022 w/ mild symptoms .    Neuro/Psych Essential tremor.    (+) neuromuscular disease (Peripheral neuropathy.), peripheral neuropathy  (+) CVA (d/t intraparenchymal hemorrhage of 2017, denies any residual symptoms.)    Cardiovascular   (+) hypertension  (+) hyperlipidemia  (+) DOE    Musculoskeletal Chronic pain LUE; osteoarthritis .    Skin   negative skin ROS    Hematology   negative hematology ROS  Oncology   negative hematology/oncology ROS            Physical Exam  Airway  Mallampati:  II  TM distance:  >6 cm  Neck ROM:  Limited(Extension and Side to Side)  Mouth Opening:  Normal  Facial Hair:  Mustache    Dental  normal      Cardiovascular  normal      Pulmonary  normal           Labs: (last year)    BMP  CBC/Coags   Na 139 03/24/2022  Hb 15.4 03/24/2022   K 4.7 03/24/2022  HCT 47 03/24/2022   Cl 100 03/24/2022  WBC 5.21 03/24/2022   HCO3 27 03/24/2022  PLT 161 03/24/2022   BUN 22 (H) 03/24/2022  INR - -  Cr 0.96 03/24/2022  PT - -   Glu 92 03/24/2022  PTT - -       Misc   eGFR >60 03/24/2022  MCV 104 (H) 03/24/2022   A1C - -  BNP - -       LFTs   AST 52 (H) 03/24/2022  Albumin 4.4 03/24/2022   ALT 32 03/24/2022  Protein 6.9 03/24/2022   Alk Phos 76 03/24/2022  T Bili 0.6 03/24/2022         Relevant procedures / diagnostic studies:   Results  EKG 12-Lead (Order [id number])  EKG 12-Lead  Order: [id number]  Status: Final result       Visible  to patient: Yes (seen)       Next appt: 05/08/2022 at 02:45 PM in Pain Management Art Buff, MD)       Dx: Pre-op exam    0 Result Notes         Component  Ref Range & Units 04/23/22 1544    Ventricular Rate  BPM 72    Atrial Rate  BPM 72    P-R Interval  ms 168    QRS Duration  ms 78    Q-T Interval  ms 390    QTC Calculation  ms 427    P Axis  degrees 50    R Axis  degrees 45    T Axis  degrees 50           Narrative & Impression    NORMAL SINUS RHYTHM  NORMAL ECG  WHEN COMPARED WITH ECG OF 09-Feb-2019 15:56,  NO SIGNIFICANT CHANGE WAS FOUND  Confirmed by Jamse Arn,  M.D., NEAL ([id number]) on 04/24/2022 9:05:53 AM      Specimen Collected: 04/23/22 15:44 Last Resulted: 04/24/22 09:05           Risk Calculators / Scores:                 PAT CLINIC DISCUSSION    ANESTHESIA PLAN   Informed Consent:     Anesthesia Plan discussed with:        Patient    ASA Score:     ASA: 3  Planned Anesthetic Type:      general

## 2022-05-07 NOTE — Preprocedure Instructions (Addendum)
Pre-Surgery Instructions:   Medication Instructions    Acetaminophen 500 MG Oral Tab Take on the morning of surgery with sips of water ONLY.     Alpha-Lipoic Acid 300 MG tablet Hold starting now,Oct.5,2023.    amLODIPine 5 MG tablet Take on the morning of surgery with sips of water ONLY.     atorvastatin 10 MG tablet Take on the morning of surgery with sips of water ONLY.     BENFOTIAMINE OR Hold starting now,Oct.5,2023.    betamethasone dipropionate 0.05 % lotion Hold on the morning of surgery.     Cholecalciferol (VITAMIN D3) 2000 units Oral Cap Hold on the morning of surgery.     CLINDAMYCIN cream . Hold on the morning of surgery.     Cyanocobalamin 1000 MCG Oral Tab Hold starting now,Oct.5,2023.    diclofenac 1 % gel Hold starting now,Oct.5,2023.    DULoxetine 60 MG DR capsule Take on the morning of surgery with sips of water ONLY.     EPINEPHrine 0.3 MG/0.3ML auto-injector Take AS NEEDED on the morning of surgery  .     ibuprofen 200 MG tablet Hold starting now,Oct.5,2023.    lidocaine 5 % patch If needed may apply on the morning of surgery.    lisinopril 20 MG tablet Hold on the morning of surgery.     naltrexone 50 MG tablet Hold 3 days PRIOR to surgery.    Omega-3 Fatty Acids (OMEGA 3 OR) Hold on the morning of surgery.     pregabalin 300 MG capsule Take on the morning of surgery with sips of water ONLY.     primidone 50 MG tablet Take on the morning of surgery with sips of water ONLY.            Arrive on Oct.10,2023 at [facility] ([place], [address],[place] [id number] ) at 11:00 am  for 12:30 pm  surgery.      Your surgery length is approximately 105 minutes , plus time in Recovery Room . Make sure escort driver is readily available prior to discharge.     Eating/Drinking Guidelines: Unless otherwise instructed by surgeon*.  Note if your surgery time changes, you must adjust eating/drinking to fit to below timelines, otherwise your surgery may be cancelled.    *Patients  with diabetes who have delayed gastric emptying or gastroparesis may require longer fasting times.  Patients needing bowel prep may have different instructions.    Up to 8 Hours prior to surgery check-in. Light meal only. Meal must be completed 8 hours before surgery check in/ARRIVAL !    Up to 2 Hours prior to surgery check-in. Clear Liquids only: water, apple juice, cranberry juice, Gatorade, black coffee (not espresso) or tea are allowed; no dairy,creamer. no sugar no sugar substitute . Last 8 ounces (1 cup) must be completed 2 hours before check in/ARRIVAL !      No gum, mints, candy, cough drops.  Hold alcohol,nicotine & any recreational drug including Marijuana at least 48 hrs prior to surgery.    Shower the evening before and the morning of surgery. Use antibacterial soap like Dyna-Hex,Chlorhexidine, Dial, Safeguard & or Lever 2000- focus scrubbing from neck down to toes, front back, side to side .  After you shower on the morning of surgery, please do not put any products on the skin or hair including; lotions, powders, deodorant, antiperspirant, aftershave, fragrances, face cream, makeup, conditioners, gel, mousse or hairspray.  Do not shave the surgical site  for 48 hours before surgery.    You may brush your teeth and spit, but do not have gum, candy, mints or cough drops.    Wear loose comfortable clothing, you may wish to wear a button style shirt if your surgery is above the waist.     Please leave all jewelry at home, including wedding rings, watches, necklaces and all piercings.  Plastic spacers are okay-except in mouth,lips, nostrils or in surgical area.      Please bring eyeglass case if you have one.  You may not wear contact lenses during surgery.    Bring your insurance card and photo identification.    If you have questions about anesthesia or day of surgery please call [phone].    If you have questions about surgery or aftercare please contact the surgeon    If you are running late the day  of surgery, please call [phone].    Parking is $10 per day.  In/out of campus under 30 minutes is free.

## 2022-05-07 NOTE — Progress Notes (Signed)
PROCEDURE NOTE: TRIGGER POINT INJECTIONS UNDER ULTRASOUND GUIDANCE    PROCEDURE  Trigger point injection x 14 to the bilateral  neck and shoulders (primarily on right)  Procedure peformed by: Attending physician with Resident physician present    PRE-PROCEDURE DIAGNOSIS  (M79.18) Myofascial pain  (primary encounter diagnosis).    POST-PROCEDURE DIAGNOSIS  (M79.18) Myofascial pain  (primary encounter diagnosis).    INDICATION FOR INJECTION  Myofascial pain of the neck and shoulders    ATTENDING PHYSICIAN:  Marion Downer, MD    FELLOW / RESIDENT:  Rhona Raider, MD (observed)    SEDATION:  None    NPO STATUS:  None    INTRAVENOUS LINE: None    Guidance: Ultrasound guidance used for this procedure, appropriate images viewed and saved to the medical record    DESCRIPTION OF THE PROCEDURE  Position: Prone  Monitoring: Pulse oximetry    Final Verification/Time-out: Performed and documented.  Preparation: The injection area was prepped with Chloraprep.  Imaging for needle placement: Transducer placed over the neck and shoulder region.  Needle insertion: Out-of plane.  Imaging conditions: Good visualization of the muscle planes.  Needle used: 25g, 2 inch.  At each trigger point, 1-2 ml of an injectate consisting of Bupivicaine 0.25% was injected for a total of 15 mL.    The following muscles were injected:    [x]  bilateral trapezius   [x]  bilateral levator scapulae   [x]  bilateral splenius capitis   [x]  right semispinalis capitis   [x]  right supraspinatus       The needle was removed uneventfully and hemostasis was maintained.     POST-PROCEDURE:  He was escorted to the recovery area in stable condition where he made an uneventful recovery.    COMPLICATIONS:  none     PLAN  Follow up in clinic

## 2022-05-08 ENCOUNTER — Ambulatory Visit: Payer: Medicare PPO | Attending: Anesthesiology | Admitting: Anesthesiology

## 2022-05-08 ENCOUNTER — Encounter (HOSPITAL_BASED_OUTPATIENT_CLINIC_OR_DEPARTMENT_OTHER): Payer: Self-pay

## 2022-05-08 VITALS — BP 157/89 | HR 77 | Temp 97.0°F | Resp 16

## 2022-05-08 DIAGNOSIS — M7918 Myalgia, other site: Secondary | ICD-10-CM | POA: Insufficient documentation

## 2022-05-08 MED ORDER — NALTREXONE HCL 50 MG OR TABS
ORAL_TABLET | ORAL | 0 refills | Status: DC
Start: 2022-05-08 — End: 2022-08-24

## 2022-05-08 NOTE — Patient Instructions (Signed)
[facility]  Post-Procedure Discharge Instructions      After the procedure:  • Immediate and complete pain relief is rare  • Numbness and/or weakness in the area of your body supplied by the injected nerve; these symptoms should resolve but may last up to several hours  • Some soreness and bruising at the injection site(s)    Activities:  • If you have any weakness or numbness caused by the injection, DO NOT DRIVE or operate machinery and limit other activity until sensation returns to normal.  • You may resume regular exercises/activities as tolerated.  • If you received sedation, DO NOT DRIVE or operate machinery for 24 hours.    Medications:  If you stopped taking any blood thinning medications such as Coumadin or Plavix, you may resume these tomorrow unless specified differently by the prescribing physician.    Site care:  • You may remove the band-aid after 6 hours.  • You may shower today. No swimming, tub baths or hot tubs for 24 hours following your procedure.  • For the first 48 hours, apply ice packs to the injection site for 15-20 minutes hourly as needed for comfort.  Wrap a light towel or cloth around ice packs and heating pad to protect the skin.  • After 48 hours, use a warm heating pad to the injection site for 15-20 minutes hourly as needed for comfort.    If you received steroids today:  • Steroid medications may cause facial flushing, occasional low-grade fevers, hiccups, insomnia, headaches, water retention, increased appetite, increased heart rate, and abdominal cramping or bloating. These side effects occur in only about 5 percent of patients and commonly disappear within one to three days after the injection.  • If you are diabetic check your blood sugar more frequently than usual as you may develop an increase in blood sugar for the next 10-14 days. Contact your diabetes physician if this occurs.    Call us if you develop any of the following symptoms in the next 7  days:  • Fever above 100 degrees F     • Any unusual increase in your level of pain  • Swelling, bleeding, redness, or increased tenderness at the procedure or IV site  • Headache not relieved by Tylenol (if you had an epidural steroid injection)  .    Contact us:  Anytime, 24 hours/day, 7 days/week at [phone]    [facility]  Patient Self-Administered 6 Hour Pain Diary    1.  For accuracy keep this diary with you during the 6 hour period so you can write down your pain scores as you go, at the time noted on your diary.  During the 6 hours, please document your pain scores while doing activities that provoke your pain as well as when you are not doing those activities.      2.  Within 24 hours of completing this pain score diary, please send us your pain score diary either by:  1) Scanning into [facility]  2)  Faxing to [phone] or 3)  Calling the pain score voicemail line at [phone].  Leave the spelling of your first and last name,  U#, date of birth, date of service and the 20 numbers you wrote below, including pre-procedure, immediately post procedure and % pain relief.  After that, you may leave any comments regarding your post block experience that may be helpful (e.g. was able to walk, much worse, had to take   medications).      Use this scale to rate your pain  0 1 2 3 4 5 6 7 8 9 10  No pain                                          worst pain     TIME PAIN SCORE WITH PAINFUL ACTIVITY PAIN SCORE WITHOUT PAINFUL ACTIVITY   Before injections:       Immediately after injections:       30 minutes after injections:       1 hour after injections:       2 hours after injections:       3 hours after injections:       4 hours after injections:       5 hours after injections:       6 hours after injections:       When I had the lowest pain score, my % (percentage) pain relief:          IT IS ESSENTIAL YOU CALL IN YOUR PAIN DIARY RESULTS BEFORE ANY FURTHER PROCEDURE(S) CAN BE SCHEDULED.

## 2022-05-08 NOTE — Progress Notes (Signed)
Ronnie Mason identified by name and DOB.   He confirmed he is having a Right Neck TPI  He ambulated to procedure area using a cane.  He has not fallen in the last 6 months.  Fall prevention interventions: Patient provided with non-skid stockings  He denies taking any blood thinners.  He denies having a bleeding disorder.  He  Has held his medications per provider instructions.  He has not been on antibiotics for the past two weeks.   He denies a history of fainting during medical procedures.   He has not been NPO.  He undressed self without assistance.  Is He receiving a steroid injection today? No  If yes, has He received the COVID-19 vaccine (either brand) within the past 2 weeks? NA  OR does He plan to receive the COVID-19 vaccine (either brand) within the next 2 weeks?NA  I verbally reviewed written discharge instructions and pain diary with him.   Signed consent for series not older than 90 days is scanned in the patient's chart: No      Confirmed ride home with his family at bedside     He has a pre-procedure pain score of 5/10 Bilateral Neck

## 2022-05-08 NOTE — Progress Notes (Signed)
Pain score prior to procedure:  5/10  Pain score after procedure:  4/10    Ronnie Mason met discharge criteria:  A/OX4/baseline, VSS/returned to baseline, ambulatory with cane. Site clean dry and intact.  Written and verbal discharge instructions, including emergency contact phone numbers, reviewed with Ronnie Mason. Verbal understanding obtained from him.  Ronnie Mason dressed self without assistance. He was discharged in stable condition, ambulatory with cane to home with all belongings and with ride/responsible person.

## 2022-05-12 DIAGNOSIS — M205X1 Other deformities of toe(s) (acquired), right foot: Secondary | ICD-10-CM | POA: Insufficient documentation

## 2022-05-13 ENCOUNTER — Ambulatory Visit (HOSPITAL_BASED_OUTPATIENT_CLINIC_OR_DEPARTMENT_OTHER): Payer: Self-pay | Admitting: Podiatrist

## 2022-05-13 ENCOUNTER — Encounter (HOSPITAL_BASED_OUTPATIENT_CLINIC_OR_DEPARTMENT_OTHER): Payer: Self-pay | Admitting: Podiatrist

## 2022-05-13 ENCOUNTER — Ambulatory Visit (HOSPITAL_BASED_OUTPATIENT_CLINIC_OR_DEPARTMENT_OTHER): Payer: Medicare PPO

## 2022-05-13 ENCOUNTER — Ambulatory Visit (HOSPITAL_BASED_OUTPATIENT_CLINIC_OR_DEPARTMENT_OTHER): Payer: Medicare PPO | Admitting: Certified Registered"

## 2022-05-13 ENCOUNTER — Encounter (HOSPITAL_BASED_OUTPATIENT_CLINIC_OR_DEPARTMENT_OTHER)
Admission: RE | Disposition: A | Discharge status: Home / Self Care | Payer: Self-pay | Source: Home / Self Care | Attending: Podiatrist

## 2022-05-13 ENCOUNTER — Ambulatory Visit
Admission: RE | Admit: 2022-05-13 | Discharge: 2022-05-13 | Disposition: A | Discharge status: Home / Self Care | Payer: Medicare PPO | Attending: Podiatrist | Admitting: Podiatrist

## 2022-05-13 DIAGNOSIS — M1990 Unspecified osteoarthritis, unspecified site: Secondary | ICD-10-CM | POA: Insufficient documentation

## 2022-05-13 DIAGNOSIS — G8929 Other chronic pain: Secondary | ICD-10-CM | POA: Insufficient documentation

## 2022-05-13 DIAGNOSIS — M205X1 Other deformities of toe(s) (acquired), right foot: Secondary | ICD-10-CM

## 2022-05-13 DIAGNOSIS — Z888 Allergy status to other drugs, medicaments and biological substances status: Secondary | ICD-10-CM | POA: Insufficient documentation

## 2022-05-13 DIAGNOSIS — G9001 Carotid sinus syncope: Secondary | ICD-10-CM | POA: Insufficient documentation

## 2022-05-13 DIAGNOSIS — N183 Chronic kidney disease, stage 3 unspecified: Secondary | ICD-10-CM | POA: Insufficient documentation

## 2022-05-13 DIAGNOSIS — Z8673 Personal history of transient ischemic attack (TIA), and cerebral infarction without residual deficits: Secondary | ICD-10-CM | POA: Insufficient documentation

## 2022-05-13 DIAGNOSIS — E785 Hyperlipidemia, unspecified: Secondary | ICD-10-CM | POA: Insufficient documentation

## 2022-05-13 DIAGNOSIS — G250 Essential tremor: Secondary | ICD-10-CM | POA: Insufficient documentation

## 2022-05-13 DIAGNOSIS — I129 Hypertensive chronic kidney disease with stage 1 through stage 4 chronic kidney disease, or unspecified chronic kidney disease: Secondary | ICD-10-CM | POA: Insufficient documentation

## 2022-05-13 DIAGNOSIS — G629 Polyneuropathy, unspecified: Secondary | ICD-10-CM | POA: Insufficient documentation

## 2022-05-13 DIAGNOSIS — Z79899 Other long term (current) drug therapy: Secondary | ICD-10-CM | POA: Insufficient documentation

## 2022-05-13 DIAGNOSIS — Z8616 Personal history of COVID-19: Secondary | ICD-10-CM | POA: Insufficient documentation

## 2022-05-13 HISTORY — PX: PR CORRECTION HAMMERTOE: 28285

## 2022-05-13 SURGERY — CORRECTION, HAMMER TOE
Anesthesia: General | Site: Toes | Laterality: Right | Wound class: Class I/ Clean

## 2022-05-13 MED ORDER — PROPOFOL 1000 MG/100ML IV EMUL
INTRAVENOUS | Status: AC
Start: 2022-05-13 — End: 2022-05-13
  Filled 2022-05-13: qty 100

## 2022-05-13 MED ORDER — PROPOFOL 10 MG/ML IV EMUL WRAPPER (OSM ONLY)
INTRAVENOUS | Status: DC | PRN
Start: 2022-05-13 — End: 2022-05-13
  Administered 2022-05-13: 200 mg via INTRAVENOUS

## 2022-05-13 MED ORDER — FENTANYL CITRATE (PF) 50 MCG/ML IJ SOLN WRAPPER (ANESTHESIA OSM ONLY)
INTRAMUSCULAR | Status: DC | PRN
Start: 2022-05-13 — End: 2022-05-13
  Administered 2022-05-13: 100 ug via INTRAVENOUS

## 2022-05-13 MED ORDER — METOCLOPRAMIDE HCL 5 MG/ML IJ SOLN
5.0000 mg | Freq: Once | INTRAMUSCULAR | Status: DC | PRN
Start: 2022-05-13 — End: 2022-05-13

## 2022-05-13 MED ORDER — SODIUM CHLORIDE 0.9 % IR SOLN
Status: DC | PRN
Start: 2022-05-13 — End: 2022-05-13
  Administered 2022-05-13: 500 mL

## 2022-05-13 MED ORDER — GLYCOPYRROLATE 0.2 MG/ML IJ SOLN
0.2000 mg | INTRAMUSCULAR | Status: DC | PRN
Start: 2022-05-13 — End: 2022-05-13

## 2022-05-13 MED ORDER — ONDANSETRON HCL 4 MG/2ML IJ SOLN
INTRAMUSCULAR | Status: AC
Start: 2022-05-13 — End: 2022-05-13
  Filled 2022-05-13: qty 2

## 2022-05-13 MED ORDER — CEFAZOLIN SODIUM-DEXTROSE 2-4 GM/100ML-% IV SOLN
INTRAVENOUS | Status: AC
Start: 2022-05-13 — End: 2022-05-13
  Filled 2022-05-13: qty 100

## 2022-05-13 MED ORDER — ONDANSETRON HCL 4 MG/2ML IJ SOLN
4.0000 mg | INTRAMUSCULAR | Status: DC | PRN
Start: 2022-05-13 — End: 2022-05-13

## 2022-05-13 MED ORDER — LACTATED RINGERS IV SOLN
100.0000 mL/h | INTRAVENOUS | Status: DC
Start: 2022-05-13 — End: 2022-05-13

## 2022-05-13 MED ORDER — LIDOCAINE HCL 1 % IJ SOLN
INTRAMUSCULAR | Status: AC
Start: 2022-05-13 — End: 2022-05-13
  Filled 2022-05-13: qty 20

## 2022-05-13 MED ORDER — LACTATED RINGERS IV SOLN
10.0000 mL/h | INTRAVENOUS | Status: DC
Start: 2022-05-13 — End: 2022-05-13
  Administered 2022-05-13: 10 mL/h via INTRAVENOUS

## 2022-05-13 MED ORDER — EPHEDRINE SULFATE (PRESSORS) 50 MG/ML IV SOLN
INTRAVENOUS | Status: DC | PRN
Start: 2022-05-13 — End: 2022-05-13
  Administered 2022-05-13: 5 mg via INTRAVENOUS
  Administered 2022-05-13 (×2): 10 mg via INTRAVENOUS

## 2022-05-13 MED ORDER — LIDOCAINE HCL (PF) 1 % IJ SOLN
INTRAMUSCULAR | Status: DC | PRN
Start: 2022-05-13 — End: 2022-05-13
  Administered 2022-05-13: 2 mL via INTRAVENOUS

## 2022-05-13 MED ORDER — ACETAMINOPHEN 500 MG OR TABS
1000.0000 mg | ORAL_TABLET | ORAL | Status: DC
Start: 2022-05-13 — End: 2022-05-13

## 2022-05-13 MED ORDER — FENTANYL CITRATE (PF) 100 MCG/2ML IJ SOLN
12.5000 ug | INTRAMUSCULAR | Status: DC | PRN
Start: 2022-05-13 — End: 2022-05-13

## 2022-05-13 MED ORDER — PROPOFOL 10 MG/ML IV EMUL WRAPPER (OSM ONLY)
INTRAVENOUS | Status: DC | PRN
Start: 2022-05-13 — End: 2022-05-13
  Administered 2022-05-13: 50 ug/kg/min via INTRAVENOUS

## 2022-05-13 MED ORDER — DEXAMETHASONE SODIUM PHOSPHATE 10 MG/ML IJ SOLN
INTRAMUSCULAR | Status: DC | PRN
Start: 2022-05-13 — End: 2022-05-13
  Administered 2022-05-13: 10 mg

## 2022-05-13 MED ORDER — HYDRALAZINE HCL 20 MG/ML IJ SOLN
5.0000 mg | INTRAMUSCULAR | Status: DC | PRN
Start: 2022-05-13 — End: 2022-05-13

## 2022-05-13 MED ORDER — FENTANYL CITRATE (PF) 100 MCG/2ML IJ SOLN
INTRAMUSCULAR | Status: AC
Start: 2022-05-13 — End: 2022-05-13
  Filled 2022-05-13: qty 2

## 2022-05-13 MED ORDER — PHENYLEPHRINE HCL-NACL 1-0.9 MG/10ML-% IV SOSY
PREFILLED_SYRINGE | INTRAVENOUS | Status: AC
Start: 2022-05-13 — End: 2022-05-13
  Filled 2022-05-13: qty 10

## 2022-05-13 MED ORDER — PROPOFOL 200 MG/20ML IV EMUL
INTRAVENOUS | Status: AC
Start: 2022-05-13 — End: 2022-05-13
  Filled 2022-05-13: qty 20

## 2022-05-13 MED ORDER — BUPIVACAINE HCL (PF) 0.25 % IJ SOLN
INTRAMUSCULAR | Status: AC
Start: 2022-05-13 — End: 2022-05-13
  Filled 2022-05-13: qty 30

## 2022-05-13 MED ORDER — LACTATED RINGERS IV SOLN
10.0000 mL/h | INTRAVENOUS | Status: DC
Start: 2022-05-13 — End: 2022-05-13

## 2022-05-13 MED ORDER — LABETALOL HCL 5 MG/ML IV SOLN
2.5000 mg | INTRAVENOUS | Status: DC | PRN
Start: 2022-05-13 — End: 2022-05-13

## 2022-05-13 MED ORDER — ACETAMINOPHEN 10 MG/ML IV SOLN
1000.0000 mg | Freq: Once | INTRAVENOUS | Status: AC
Start: 2022-05-13 — End: 2022-05-13
  Administered 2022-05-13: 1000 mg via INTRAVENOUS

## 2022-05-13 MED ORDER — DEXAMETHASONE SODIUM PHOSPHATE 4 MG/ML IJ SOLN
INTRAMUSCULAR | Status: AC
Start: 2022-05-13 — End: 2022-05-13
  Filled 2022-05-13: qty 1

## 2022-05-13 MED ORDER — PHENYLEPHRINE HCL-NACL 1-0.9 MG/10ML-% IV SOSY
PREFILLED_SYRINGE | INTRAVENOUS | Status: DC | PRN
Start: 2022-05-13 — End: 2022-05-13
  Administered 2022-05-13: 150 ug via INTRAVENOUS
  Administered 2022-05-13: 100 ug via INTRAVENOUS
  Administered 2022-05-13: 150 ug via INTRAVENOUS

## 2022-05-13 MED ORDER — ACETAMINOPHEN 500 MG OR TABS
ORAL_TABLET | ORAL | Status: AC
Start: 2022-05-13 — End: 2022-05-13
  Filled 2022-05-13: qty 2

## 2022-05-13 MED ORDER — DEXAMETHASONE SOD PHOSPHATE PF 10 MG/ML IJ SOLN
INTRAMUSCULAR | Status: AC
Start: 2022-05-13 — End: 2022-05-13
  Filled 2022-05-13: qty 1

## 2022-05-13 MED ORDER — EPHEDRINE SULFATE (PRESSORS) 25 MG/5ML IV SOSY
PREFILLED_SYRINGE | INTRAVENOUS | Status: AC
Start: 2022-05-13 — End: 2022-05-13
  Filled 2022-05-13: qty 5

## 2022-05-13 MED ORDER — OXYCODONE HCL 5 MG OR TABS
5.0000 mg | ORAL_TABLET | ORAL | Status: DC | PRN
Start: 2022-05-13 — End: 2022-05-13

## 2022-05-13 MED ORDER — ONDANSETRON HCL 4 MG/2ML IJ SOLN
4.0000 mg | Freq: Once | INTRAMUSCULAR | Status: DC
Start: 2022-05-13 — End: 2022-05-13

## 2022-05-13 MED ORDER — ACETAMINOPHEN 10 MG/ML IV SOLN
INTRAVENOUS | Status: AC
Start: 2022-05-13 — End: 2022-05-13
  Filled 2022-05-13: qty 100

## 2022-05-13 MED ORDER — ONDANSETRON HCL 4 MG/2ML IJ SOLN
INTRAMUSCULAR | Status: DC | PRN
Start: 2022-05-13 — End: 2022-05-13
  Administered 2022-05-13: 4 mg via INTRAVENOUS

## 2022-05-13 MED ORDER — CEFAZOLIN SODIUM-DEXTROSE 2-4 GM/100ML-% IV SOLN
2.0000 g | INTRAVENOUS | Status: AC
Start: 2022-05-13 — End: 2022-05-13
  Administered 2022-05-13: 2 g via INTRAVENOUS

## 2022-05-13 MED ORDER — BUPIVACAINE HCL (PF) 0.25 % IJ SOLN
INTRAMUSCULAR | Status: DC | PRN
Start: 2022-05-13 — End: 2022-05-13
  Administered 2022-05-13: 5 mL via INTRAMUSCULAR
  Administered 2022-05-13: 10 mL via INTRAMUSCULAR
  Administered 2022-05-13: 3 mL via INTRAMUSCULAR

## 2022-05-13 MED ORDER — HYDROMORPHONE HCL 1 MG/ML IJ SOLN
0.2000 mg | INTRAMUSCULAR | Status: DC | PRN
Start: 2022-05-13 — End: 2022-05-13

## 2022-05-13 MED ORDER — DEXAMETHASONE SODIUM PHOSPHATE 4 MG/ML IJ SOLN
INTRAMUSCULAR | Status: DC | PRN
Start: 2022-05-13 — End: 2022-05-13
  Administered 2022-05-13: 4 mg via INTRAVENOUS

## 2022-05-13 SURGICAL SUPPLY — 31 items
APPLICATOR PREP CHLORAPREP 26ML HI-LITE ORANGE 2% CHG (Prep) ×2 IMPLANT
BANDAGE ELASTIC 4IN  NON-STERILE (Dressing) ×1 IMPLANT
BANDAGE GAUZE BULKEE II 4.1YDX4.5IN (Dressing) ×1 IMPLANT
BLADE SAW .7INX.21IN THK.01IN OSCILLATE SAGITTAL (Blade) IMPLANT
BLADE SAW 31MMX9MM MED OSCILLATE SAGITTAL (Blade) ×1 IMPLANT
BLADE SAW 9MM THK .38MM OSCILLATE SAGITTAL (Blade) IMPLANT
BURR 4MM MED OVAL CARBIDE (Bur) IMPLANT
COVER SURGEON CONTROL STERILE LF LIGHT HANDLE (Other) ×4 IMPLANT
CUFF TOURNIQUET 30IN SINGLE PORT SINGLE BLADDER DISPOSABLE (Other) ×1 IMPLANT
DRAPE MINI HD TOUCHSCREEN TILT AND SWIVEL 1 PRESS (Drape) ×1 IMPLANT
ELECTRODE ULTRACLEAN 1IN BLADE EXTEND INSULATE (Cautery) ×1 IMPLANT
GLOVE SURG 7 BIOGEL PI MICRO PF (Glove) ×1 IMPLANT
GLOVE SURG BIOGEL 7 ULTRATOUCH PF (Glove) ×1 IMPLANT
GLOVE SURG BIOGEL UNDERGLOVE 7 INDICATOR PF (Glove) ×1 IMPLANT
GOWN AURORA AAMI LEVEL 3 XL BLUE (Gown) ×1 IMPLANT
GUIDEWIRE ACUTRAK 2 .045IN PARALLEL (Guidewire) IMPLANT
LINEN GOWN XL (Gown) ×1 IMPLANT
LINEN PACK SURGICAL (Other) ×1 IMPLANT
NEEDLE HYPODERMIC SAFETY 19GA 1IN MAGELLAN (Needle) ×1 IMPLANT
PACK PODIATRY (Pack) ×1 IMPLANT
PAD ESURG GRNDING UNIV PREATTACH CORD SPLIT (Other) ×1 IMPLANT
PADDING CAST WEBRIL COTTON 4YDX4IN UNDERCAST STERILE (Dressing) ×1 IMPLANT
SCREW ACUTRAK 2 3.5-3.6MM 16MM COMPRESS AT2-M16 IMPLANT
SKIN CLOSURE STERI-STRIP 4INX1/2IN (Dressing) IMPLANT
SKIN CLOSURE STERI-STRIP 4INX1/4IN (Dressing) IMPLANT
SLEEVE PNEUMATIC KNEE MEDIUM (Other) ×1 IMPLANT
SUTURE POLYSORB 3-0 C-13 30IN UNDYED (Suture) ×1 IMPLANT
SUTURE POLYSORB 4-0 C-13 30IN UNDYED 36/BX (Suture) IMPLANT
SUTURE POLYSORB 5-0 PC-11 18IN UNDYED (Suture) ×1 IMPLANT
SUTURE PROLENE 4-0 FS-2 18IN MONOFILAMENT BLUE (Suture) ×1 IMPLANT
SYRINGE TUBERCULIN 1ML (Syringe) ×1 IMPLANT

## 2022-05-13 NOTE — Anesthesia Postprocedure Evaluation (Signed)
Patient: Ronnie Mason    Procedure Summary:   Date: 05/13/2022  Room/Location: [place] [facility] [place] [facility] / [place] [facility] [place] OR    Anesthesia Start: 10:03 AM  Anesthesia Stop: 11:23 AM    Procedure(s):  CORRECTION, RIGHT HAMMER TOE, 2nd and 3rd TOE  Post-op Diagnosis     * Mallet toe, right [M20.5X1]    Responsible Provider:   Maryruth Hancock, MD  ASA Status: 3         Place of evaluation: PACU    Patient participation: patient cannot participate; patient unconscious (sedation)    Level of consciousness: sedated and responsive to voice    Patient pain control satisfaction: patient is satisfied with level of pain control    Airway patency: patent    Cardiovascular status during assessment: stable    Respiratory status during assessment: breathing comfortably    Anesthetic complications: no    Intravascular volume status assessment: euvolemic    Nausea / vomiting: patient is not experiencing nausea      Planned post-operative disposition at time of assessment: hospital discharge

## 2022-05-13 NOTE — Discharge Instructions (Addendum)
GENERAL PODIATRIC DISCHARGE INSTRUCTIONS    A surgical procedure has just been performed on your foot.  The following information, instructions and suggestions will help in obtaining the desired results by minimizing swelling and insuring good healing.    Recommended activity level:  Do not drive or perform any strenuous activity for a few days.  Go directly home & rest, relax and elevate your foot/feet above your heart, even though you may feel normal.  Limit your activity to 5-10 minutes per hour during the first week, while non weight-bearing if applies.  Activity level will vary depending on the type of surgery.  Avoid periods of standing & sitting without elevating legs and avoid crossing legs for 4-6 weeks. This will increase swelling.    Weight bearing status:  protective weight bearing using post-op shoe    Diet:   Start drinking liquids and then begin regular foods as you feel able.  If you cannot drink any liquid by the morning after your procedure, call your doctor.  Avoid drinking any alcohol for the next twenty-four(24) hours.    Safety:   You should have an adult stay with you the rest of the day and through the night. Because the sedative drugs you may have received may affect your judgment, you should not sign any important papers or make any important decisions for the next twenty four (24) hours     General instructions:  Dressing:  Must keep dry and do not remove until you see your doctor.  May take a sponge bath or take a bath and hang your foot out.    Take your temperature at 8:00 a.m. & 4:00 p.m. the first 3 days after surgery and write the results on a piece of paper.  Notify our office if temp is above 99 on two consecutive readings and corresponds with increased and constant pain.  An ice pack can help keep post-op swelling and pain down.  If you don't have a commercial ice pack, place ice cubes in l gallon double Ziploc freezer bags (one bag inside the other) and wrap in a thin dish towel  and apply when you get home on the surgical site and/or behind the knee.  Ace bandage will help hold the ice pack on your foot/feet.  Use intermittently 2 hours on 1 hour off during 1st week.  Take off while sleeping during the night. Continue to apply ice packs 3 times a days for 2-3 weeks.  If you find the pressure of your bed sheets uncomfortable, construct a tent for your feet by placing a cardboard box of appropriate size at the foot of the bed to protect your foot from the covers.  Use crutches/walker/Roll-About if directed to do so to take weight off the foot/ankle when walking or standing.  Wear the post-op shoe / surgical boot if dispensed for protection and stability.  An oversized cotton or wool sock worn over the dressing will be helpful when one is outside to keep dressing clean and the toes warm.  Exercise your legs frequently by bending knees and/or ankles to stimulate circulation, healing and prevent DVT (blood clot).  Additionally, we may recommend use of Aspirin 81 mg or 325 mg (once or twice a day) or other blood thinner to help prevent DVT/blood clot per doctor's recommendation.  Avoid cigarettes and marijuana which will impede blood flow and can lead to delayed or non-healing.    Post-Op Management of pain & medications:  If your procedure and recovery  go as planned, most patients find the healing period an inconvenience rather than a painful experience.  Remember that the local anesthetic block administered at the time of surgery will keep the foot numb for approximately 6 to 24 hours and on occasion up to 72 hours.  Take the post-op pain medication as needed.  Some patients will require very little--some more to control pain.  Anticipate each post-op day requiring less and less medication.  Most patients will no longer need prescription pain pills beyond 72 hours except perhaps to go to sleep.    Call our office if:  Medications cause headache, rash or other abnormal reaction.  Medications do  not control discomfort adequately.  The bandages become overly stained.  Some bleeding is expected.   You develop a fever.  You injured or fell on your surgical foot that caused ongoing pain    The office and after hours number is [phone].    If you are traveling from out-lying areas please think about how you will travel home.  We would like your foot/feet to be elevated above your heart (i.e. sitting in the backseat of the car with the foot elevated).  Also you should plan ahead for lunch on the road.  We do not recommend dangling your foot down while at a restaurant.     LONG FLIGHT:  We recommend avoiding long airplane flights after the surgery (approx. 3 months).  One may have greater risk of blood clot (DVT/PE).        POST ANESTHESIA GUIDELINES      You have received sedation or an anesthetic today for your surgery. Therefore there are some guidelines we ask that you follow for your safety and well-being:  You must have a responsible adult escort you home or the the place where you will stay while you recover from surgery.   You cannot drive yourself.  You cannot take a taxi or bus by yourself.   Your escort must help you get into your home or place of recovery and  help you settle in.     The First 24 Hours  We strongly advise that a responsible adult stay with you for 24 hours after discharge. This adult should be able to help take care of you at home or in your place of recovery. This is for you safety, in case you have any problems and need extra care after your surgery today. If you choose not to follow this advice, [facility] is not responsible for your safety.   You can expect to have some pain and maybe some nausea after surgery. You may also be sleepy for the rest of the afternoon.   For 24 hours after having anesthesia, DO NOT:   Drive  Drink alcohol / No Smoking / No Vaping / No Marijuana Products or Recreational drugs of any kind.  Travel alone  Use machinery  Sign any legal papers  Be  responsible for children, pets, or an adult who needs care

## 2022-05-13 NOTE — Op Note (Signed)
Podiatry Operative Note   Ronnie Mason - DOB: [birth year] 71 year old male) MRN: [id number]  Procedure Date: 05/13/2022 Location:[place] [facility] OR       Preoperative Diagnosis:   Mallet toe 2nd and 3rd, right [M20.5X1]    Post Operative Diagnosis:    Mallet toe 2nd and 3rd, right [M20.5X1]    Procedures Performed:  CORRECTION, RIGHT MALLET TOE, 2nd TOE, RIGHT  CORRECTION, RIGHT MALLET TOE, 3rd TOE, RIGHT    I, Ancil Linsey, DPM, attending surgeon, was present for the entire procedure(s) and performed the entire procedure(s).    Resident was unavailable to assist.       Surgeons:  Primary: Hilarie Fredrickson Dong-Hyun, DPM    Anesthesia:  General  EBL: minimal   Specimens: None   Wound Class: Procedure(s):  CORRECTION, RIGHT HAMMER TOE, 2nd and 3rd TOE - Wound Class: Class I/ Clean     Indication(s): Ronnie Mason is an 71 year old male WITH PAINFUL MALLET TOE ON 2ND AND 3RD TOE, RIGHT    Finding(s): RIGID MALLET TOE 2ND AND SEMIRIGID MALLET TOE 3RD TOE     Procedure Details:     The patient was brought to the operating room and positioned supine setting.  SCOAP protocol 1 and 2 was performed.  Following general anesthesia, a local block was given using 0.25 % Marcaine plain.  Preoperatively, the patient received 2 grams/mg of cefazolin IV.   Tourniquet was not used.      PROCEDURE:  Mallet toe correction of 2nd toe was performed in following fashion:   Approximate  1 cm transverse incision was made the dorsal aspect of toe.  Careful blunt and sharp dissection was made and all superficial bleeders cauterized.  Using a 15 blade a tenotomy was performed on the extensor digitorum longus.  Careful dissection was made to expose the joint.  Using a sagittal saw the head of the intermediate phalanx as well as base of the distal phalanx were resected in preparation for arthrodesis.  To address rigid contracture, more resection of intermediate phalanx was needed. Next, blunt dissection was made plantarly, and plantar  ligament/flexor tenotomy was performed.   Toes have good perfusion.    At this point surgical site was thoroughly irrigated with normal saline.  A guidepin was placed from the base of the distal phalanx and run distally.  Once the toe is placed in good rectus setting with slight plantarflexion position the pin was retrograded to the intermediate phalanx.  The guidepin was measured and headless screw was engaged from distal to the shaft of the intermediate phalanx without intruding into PIPJ.  This fixation provide good compression and stability at this site.  Of note that the toes slightly curved which will allowed good toe purchase. The toe was fixated with mini Accumet headless screw.   The extensor tendon was repair with 3-0 Polysorb.  And skin is closed 4-0 Surgipro.    PROCEDURE:  Mallet toe correction of 3rd toe was performed in following fashion:   Approximate  1 cm transverse incision was made the dorsal aspect of toe.  Careful blunt and sharp dissection was made and all superficial bleeders cauterized.  Using a 15 blade a tenotomy was performed on the extensor digitorum longus.  Careful dissection was made to expose the joint.  Using a sagittal saw the head of the intermediate phalanx as well as base of the distal phalanx were resected in preparation for arthrodesis.  Blunt dissection was made plantarly,  and plantar ligament/flexor tenotomy was performed.   Toes have good perfusion.    At this point surgical site was thoroughly irrigated with normal saline.  A guidepin was placed from the base of the distal phalanx and run distally.  Once the toe is placed in good rectus setting with slight plantarflexion position the pin was retrograded to the intermediate phalanx.  The guidepin was measured and headless screw was engaged from distal to the shaft of the intermediate phalanx without intruding into PIPJ.  This fixation provide good compression and stability at this site.  Of note that the toes slightly  curved which will allowed good toe purchase. The toe was fixated with mini Accumet headless screw.   The extensor tendon was repair with 3-0 Polysorb.  And skin is closed 4-0 Surgipro.    Intraoperative fluoroscopy exam revealed: Good bone to bone apposition of the fusion site as well as good position of the fixations.    Post op injection: 5 ml of 9:1 mix of .25% marcaine and dexamethasone.      The dressing entails Steri-Strip, Adaptic soaked in Betadine, multiple 4 x 4, web roll, ABD, Kerlix and Ace, and postop boot was applied.    Patient tolerated the procedure and anesthesia well.  Patient left the OR to recovery with vital signs stable and normal.  The vascular is intact by noting immediate cap refill all digits.      Plan: Patient was instructed to protective weight-bear with post-op boot.  Patient follow approximately 1-2 for postop check.      Prognosis: Good     Complications: None.     Condition: stable

## 2022-05-13 NOTE — Procedure Nursing Note (Signed)
Patient made several statements of his plan not to be compliant with instructions such as not driving for two weeks.  I made it very clear to both the patient and the wife that it was imperative not to drive for the instructed time.  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

## 2022-05-13 NOTE — Anesthesia Procedure Notes (Signed)
Airway Placement    Staff:  Performing Provider: Maryruth Hancock, MD  Authorizing Provider: Maryruth Hancock, MD    Airway management:   Patient location: OR/Procedural area  Final airway type: Supraglottic airway  Intubation reason: General anesthesia/planned procedure    Induction:  Positioning: supine  Preoxygenation: Yes  Mask Ventilation: Grade 0 - Ventilation by mask not attempted     Intubation:    Number of Attempts: 1    Final Attempt   Airway Type: SGA  LMA Type: i-gel  LMA Size:5    Assessment:  Confirmation: waveform capnography  Difficult Airway: no  Procedure Abandoned: no    Date / Time Airway Secured / Re-Secured:  05/13/2022 10:10 AM

## 2022-05-13 NOTE — Interval H&P Note (Signed)
The linked note is still valid and up to date; no changes are required.

## 2022-05-14 ENCOUNTER — Encounter (INDEPENDENT_AMBULATORY_CARE_PROVIDER_SITE_OTHER): Payer: Self-pay | Admitting: Physician Assistant

## 2022-05-14 ENCOUNTER — Telehealth (INDEPENDENT_AMBULATORY_CARE_PROVIDER_SITE_OTHER): Payer: Self-pay | Admitting: Physician Assistant

## 2022-05-14 DIAGNOSIS — M205X1 Other deformities of toe(s) (acquired), right foot: Secondary | ICD-10-CM

## 2022-05-14 NOTE — Telephone Encounter (Signed)
PROCEDURES:  CORRECTION, RIGHT MALLET TOE, 2nd TOE, RIGHT  CORRECTION, RIGHT MALLET TOE, 3rd TOE, RIGHT    DOS: 05/13/22    LATERALITY: RIGHT    PO WEEK #: 1 day    SURGEON: Dr. Wilmon Arms, DPM        Nerve blocks:  none      Patient was placed in a:  []   Jones compression cast  [x]   Cast boot  []   Soft podiatric boot  []   Post-operative shoe  []   None    [x]   Non-weightbearing  []   Toe-touch weightbearing  []   Protected weightbearing as tolerated  []   Weightbearing as tolerated      DIAGNOSIS:  (M20.5X1) Mallet toe of right 2nd and 3rd toe  (primary encounter diagnosis)           Purpose of my call: day #1 post-operative telephone follow-up on: patient status, pain level, review of systems, activity level, education on dressings, follow-up appointment confirmation.     Patient's telephone rang to voicemail. I left a message with the clinic phone number [phone]) for the patient to call with any questions or concerns     Julieanne Manson, PA-C  Teaching Associate  Department of Orthopaedics and [place] [facility] [place]

## 2022-05-18 NOTE — Progress Notes (Unsigned)
[facility] Follow Up Visit  05/21/2022   Pauline Trainer;  MRN: [id number];   DOB: 12-17-1950    CHIEF COMPLAINT:   No chief complaint on file.      HISTORY OF PRESENT ILLNESS:    The purpose of today's visit is interval reevaluation of bilateral neck and shoulder pain s/p trigger point injections on 05/08/2022. The patient has a pain history significant for cervical radiculopathy, cervical stenosis, multilevel neuroforaminal stenosis, and myofascial pain. The patient was last seen by Dr. Araceli Bouche on 03/19/2022 and 4 trigger point injections on 05/08/2022, at which time we recommended   " Assessment/Discussion  (M54.12) Cervical radiculopathy, chronic  (primary encounter diagnosis)     Chancey Ringel is a 71 year old male with  cervical stenosis with left radiculopathy, multilevel neural foraminal stenosis, multilevel lumbar spondylosis wanting neurosurgery evaluation for cervical radiculopathy. Has failed conservative therapy. Has had 2 cervicothoracic epidural injections with 1 week of relief. On exam, he has myofascial tenderness. We recommend cervical TPI at this time. If still have continued similar focal pain in right lower neck after TPI then we can consider cervical facet medial branch block in hopes that this avoids surgical options. Patient would like to meet with neurosurgeon in regards to cervical spine as he would like a "permanent fix." But also open to exploring options.        Plan:    Referred patient to neurosurgery to discuss options  2. Referral to weight loss clinic as patient would like to consider medication assisted weight loss which may help with back pain  3. Medications to discuss with his PCP, Macario Carls, MD and/or other involved providers:   None at this time  4. Follow-up: for trigger point injection"    ***  Since his trigger point injections, he has reported of ***% pain relief in the neck and shoulder region.    Since his last visit, he has also undergone second and  third right hammertoe surgery with his podiatrist [name] on 05/13/2022.        The pain is located primarily in the neck and is constant.  Other pain locations include back.  He describes his neck pain as dull and left arm pain is burning. His other back pains are described as aching.  He feels the overall pain is  gradually worsening.  The pain is made better by acupuncture and PT.  The pain is made worse by turning head, driving  The impacts of the pain include taking breaks a lot. doesn't wake me up from sleep.     Hasn't done tpi for neck. Only for back and reports 3 days of relief 01/2022  Cervical epidural 2/2 had 7 days of relief in 01/2022. Most relief was in the left arm.    Labs/Imaging Reviewed:  {Labs  Imaging:999}  04/14/2022 XR C SPINE 4-5 VW:  FINDINGS AND  IMPRESSION  In the neutral position, a grade 1-2 anterolisthesis at the C4-5 level is similar to December 2022. No other spinal malalignment.     There is good range of motion with neck flexion. Limited range of motion with neck extension. The C4-5 anterolisthesis is unchanged in both flexion and extension. No dynamic instability demonstrated.     No acute fracture is seen.     Multilevel degenerative disc disease. Degenerative changes are also noted in the facet joints at multiple levels bilaterally.     No abnormal prevertebral soft tissue swelling.  09/03/2021 MRI C SPINE W/O CONTRAST:  IMPRESSION  1. Multilevel lumbar spondylosis as detailed above more prominence at C4-C5 where there is mild to moderate central and moderate bilateral foraminal narrowing.  2. Multilevel moderate to severe neural foraminal stenosis at other levels as detailed above.    {Problem List  Medical Hx  Surgical Hx  Family Hx  Substance & Sexual Hx  Social Documentation:999}    REVIEW OF SYSTEMS:  The review of systems documented by our clinic staff was provided by the patient. I {DID/DID NOT:27244::"did"} review the ROS and {pain clinic ROS  comments:112481}    PHYSICAL EXAMINATION:  There were no vitals taken for this visit.   General:  {General:129929::"healthy-appearing"}  {Nourished:129930::"well-nourished"}  {No/Mild/Moderate:129931::"no"} acute distress  Mental Status:  {Active/lethargic:129932::"active"}  {Mood:129933::"normal"} mood  {affect:129934::"normal"} affect  Alert and oriented {Alert/Oriented:129935::"x1"}  Head:  {Head:129936::"normal appearing"}  {No/min/mild/mod:129937} tenderness over the nuchal ridge   Shoulders:  {No/min/mild/mod:129937} pain with flexion  {No/min/mild/mod:129937} pain with extension  {No/min/mild/mod:129937} pain with external rotation  {No/min/mild/mod:129937} pain with internal rotation  {No/min/mild/mod:129937} tenderness over the {left right bilateral:109974} acromioclavicular joint  {No/min/mild/mod:129937} tenderness over the {left right bilateral:109974} glenohumeral joint  {Neg/Pos:129938::"negative"} empty can test {L/R/Bilaterally:129939}   Extremities:  {No/min/mild/mod:129937} pain with flexion of the {left right bilateral:109974} {elbow/ankle:129940}   {No/min/mild/mod:129937} pain with extension of the {left right bilateral:109974} {elbow/ankle:129940}  {No/min/mild/mod:129937} tenderness over the {left right bilateral:109974} of the {left right bilateral:109974} {elbow/ankle:129940}  {No/min/mild/mod:129937} tenderness over the {left right bilateral:109974} of the {left right bilateral:109974} {elbow/ankle:129940}  Cervical spine:   {No/min/mild/mod:129937} pain with flexion  {No/min/mild/mod:129937} pain with extension  {No/min/mild/mod:129937} tenderness over the {Upper/mid/lower:129944} facet joints and paraspinous muscles to palpation  {No/min/mild/mod:129937} pain with extension and {LEFT RIGHT:105248} facet loading  {Neg/Pos:129938::"negative"} Spurling's test {L/R/Bilaterally:129939}  Thoracic spine:   {No/min/mild/mod:129937} pain with flexion  {No/min/mild/mod:129937} pain with  extension  {No/min/mild/mod:129937} pain with lateral bending  {No/min/mild/mod:129937} tenderness over the {Upper/mid/lower:129944} facet joints and paraspinous muscles to palpation  {No/min/mild/mod:129937} pain with extension and {LEFT RIGHT:105248} facet loading  Lumbar spine:   {No/min/mild/mod:129937} pain with flexion  {No/min/mild/mod:129937} pain with extension  {No/min/mild/mod:129937} tenderness over the {Upper/mid/lower:129944} facet joints and paraspinous muscles to palpation  {No/min/mild/mod:129937} pain with extension and {LEFT RIGHT:105248} facet loading  {Neg/Pos:129938::"negative"} straight leg raise (SLR) {L/R/Bilaterally:129939} at {Degrees:129946} degrees  Hips:   {Neg/Pos:129938::"negative"} FABER test {L/R/Bilaterally:129939}  {Neg/Pos:129938::"negative"} SI compression test {L/R/Bilaterally:129939}   {Neg/Pos:129938::"negative"} Yeoman's {L/R/Bilaterally:129939}  {tenderness:129948::"nontender"} to palpation over the {left right bilateral:109974} sacroiliac joints  {Neg/Pos:129938::"negative"} FAIR test {L/R/Bilaterally:129939}  {tenderness:129948::"nontender"} to palpation over the {left right bilateral:109974} buttocks  {tenderness:129948::"nontender"} to palpation over the {left right bilateral:109974}  greater trochanter bursa  Knees:  {No/  **:31982} bony abnormalities {L/R/Bilaterally:129939}   {varus/valgus:129958::"no"} alignment deformity  {No/min/mild/mod:129937} erythema  {L/R/Bilaterally:129939}   {No/min/mild/mod:129937} effusion  {L/R/Bilaterally:129939}   {No/min/mild/mod:129937} tenderness over no/medial/lateral joint lines   {Neg/Pos:129938::"negative"} anterior drawer test  {L/R/Bilaterally:129939}   {Neg/Pos:129938::"negative"} posterior drawer test  {L/R/Bilaterally:129939}   {Neg/Pos:129938::"negative"} Lachman's test  {L/R/Bilaterally:129939}   Gait:  {gait:129959::"normal"} gait  {normal/forward:129960::"normal"} station  Neuro:  Cranial nerves 2-12 grossly  {intact:129961::"intact"}  Motor function {intact/w weakness/w tremor:129962::"intact"}  DTRs {DTR:129963::"2+"} over the triceps, biceps, brachioradialis, patellar, and Achilles tendons  Sensation:  {Intact/dec/inc:129964::"intact"} to light touch {grossly/over:129965::"grossly"} the *** dermatomes  {Intact/dec/inc:129964::"intact"} to pinprick {grossly/over:129965::"grossly"} the *** dermatomes  {No/min/mild/mod:129937} hyperesthesia  {No/min/mild/mod:129937} allodynia  Skin:  No obvious rashes, lesions, or erythema seen ***    IMPRESSION:   Hulet Ehrmann is a 71 year old male with left-sided cervical radiculopathy likely secondary  to cervical stenosis and multilevel neuroforaminal stenosis, myofascial pain.  He has failed conservative therapies including x2 with cervical thoracic epidural steroid injections (1 week relief) and cervical trigger point injections***.    The initial plan was that if the patient continued to have similar focal pain in the right lower neck after trigger point injections, and cervical facet medial branch blocks could be considered.  The patient would like to avoid a surgical option if possible.        PLAN:   {Problem List  Meds & Orders  LOS Calculator:999}  We discussed our impressions and the following suggestions/options in detail and  provided him with information in the after visit summary.  Mr. Schmid had no further questions.    Diagnostic evaluation: ***    Rehabilitation: ***    Medication suggestions: ***    Procedure suggestions: ***    Follow-up:   -Keep visit with neurology (06/18/2022).      Jonni Sanger, PA-C  Physician Assistant - Faculty Teaching Associate  [facility]  Department of Anesthesiology & Pain Medicine

## 2022-05-21 ENCOUNTER — Ambulatory Visit (HOSPITAL_BASED_OUTPATIENT_CLINIC_OR_DEPARTMENT_OTHER): Payer: Medicare PPO | Admitting: Anesthesiology

## 2022-05-21 ENCOUNTER — Other Ambulatory Visit: Payer: Self-pay

## 2022-05-21 ENCOUNTER — Ambulatory Visit: Payer: Medicare PPO | Attending: Medical | Admitting: Medical

## 2022-05-21 VITALS — BP 121/77 | HR 86 | Wt 225.0 lb

## 2022-05-21 DIAGNOSIS — M79602 Pain in left arm: Secondary | ICD-10-CM

## 2022-05-21 DIAGNOSIS — R208 Other disturbances of skin sensation: Secondary | ICD-10-CM

## 2022-05-21 DIAGNOSIS — M542 Cervicalgia: Secondary | ICD-10-CM | POA: Insufficient documentation

## 2022-05-21 DIAGNOSIS — M25512 Pain in left shoulder: Secondary | ICD-10-CM

## 2022-05-21 DIAGNOSIS — M7918 Myalgia, other site: Secondary | ICD-10-CM | POA: Insufficient documentation

## 2022-05-21 DIAGNOSIS — G8929 Other chronic pain: Secondary | ICD-10-CM | POA: Insufficient documentation

## 2022-05-21 DIAGNOSIS — M5412 Radiculopathy, cervical region: Secondary | ICD-10-CM | POA: Insufficient documentation

## 2022-05-21 DIAGNOSIS — M545 Low back pain, unspecified: Secondary | ICD-10-CM | POA: Insufficient documentation

## 2022-05-21 NOTE — Patient Instructions (Signed)
PLAN:     Diagnostic evaluation: None.    Rehabilitation: PT after boot removal.    Medication suggestions: None.    Procedure suggestions: Trigger point injections to the left neck and shoulder region.    Follow-up: Keep visit with neurosurgery (06/09/2022).

## 2022-05-21 NOTE — Progress Notes (Signed)
Review of Systems   Musculoskeletal:  Positive for back pain, gait problem and neck pain.   All other systems reviewed and are negative.

## 2022-05-25 ENCOUNTER — Ambulatory Visit (HOSPITAL_BASED_OUTPATIENT_CLINIC_OR_DEPARTMENT_OTHER): Payer: Medicare PPO | Admitting: Neurology

## 2022-05-27 ENCOUNTER — Ambulatory Visit (INDEPENDENT_AMBULATORY_CARE_PROVIDER_SITE_OTHER): Admit: 2022-05-27 | Discharge: 2022-05-27 | Disposition: A | Discharge status: Home / Self Care | Payer: Medicare PPO

## 2022-05-27 ENCOUNTER — Ambulatory Visit (INDEPENDENT_AMBULATORY_CARE_PROVIDER_SITE_OTHER): Payer: Medicare PPO | Admitting: Podiatrist

## 2022-05-27 DIAGNOSIS — M205X1 Other deformities of toe(s) (acquired), right foot: Secondary | ICD-10-CM

## 2022-05-27 DIAGNOSIS — Z981 Arthrodesis status: Secondary | ICD-10-CM

## 2022-05-27 NOTE — Progress Notes (Signed)
Accompanied by:  spouse    Procedure:  Mallet toe correction(28285), 2nd toe RIGHT .   Mallet toe correction(28285) 3rd toe, RIGHT .   2 weeks .  Date of surgery:  05/13/22    Ronnie Mason is a 71 year old year old male return today for routine post operative check.    This is 1st post operative visit.    The patient states please with the progress.    Patient relates no new problems/complaints.       The patient states of minimal pain.    The patient is compliant with post-operative instructions.    Currently instructed post-op weightbearing status is partial weightbearing.    The patient denies fever/chill, SOB/CP and calf pain.    Patient denies of side effects of the prescribed medication.    Current post-op pain medication is oxycodone 5mg .    The pain is well controlled.    DVT prophylaxis:  none - patient is ambulating.

## 2022-05-27 NOTE — Progress Notes (Signed)
Post-Surgery Follow-Up SOAP Note    Patient: Ronnie Mason   Patient DOB: 1951-03-04     DOS:  05/27/2022     Accompanied by:  spouse    Subjective:   (history is obtained by MA/RN and edited by Dr. Maudie Mercury)  Procedure:  Mallet toe correction(28285), 2nd toe, Mallet toe correction(28285) 3rd toe, 2 weeks .  Date of surgery:  05/13/22     Elden Brucato is a 71 year old year old male return today for routine post operative check.  He took off the original dressing and apply their new dressings.   This is 1st post operative visit.    The patient states please with the progress.    Patient relates no new problems/complaints.       The patient states of minimal pain.    The patient is compliant with post-operative instructions.    Currently instructed post-op weightbearing status is partial weightbearing.    The patient denies fever/chill, SOB/CP and calf pain.    Patient denies of side effects of the prescribed medication.    Current post-op pain medication is oxycodone 4m.    The pain is well controlled.    DVT prophylaxis:  none - patient is ambulating.      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    Idiopathic peripheral neuropathy 03/15/2015    Spondylosis of cervical region without myelopathy or radiculopathy 05/28/2014    Back pain, lumbosacral 02/15/2013    B12 deficiency 12/21/2012    Borderline low 5 /21/2014    Chronic renal insufficiency, stage III (moderate) ([facility] 12/21/2012    5/ 2013    Hyperlipidemia 09/26/2012    Cervicalgia 03/11/2010    Radiofrequency ablation of c3 to c 7     Allergic rhinitis due to allergen     Carotid Sinus Hypersensitivity     Chronic pain     chronic LUE pain had injection.    Disc disorder of lumbosacral region     Hip injury     HYPERTENSION      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([facility]         Review of patient's allergies indicates:  Allergies   Allergen Reactions    Adhesives Skin: Rash     Glue on lido patches    Bee Venom Skin: Hives, Skin: Itching and Swelling    Gabapentin Other     Flu like symptoms, diarrhea, body ache upset stomach    Methocarbamol Other     Patient's wife reports cognitive difficulties ("goofy")        Medications:   Outpatient Medications Prior to Visit   Medication Sig Dispense Refill    Acetaminophen 500 MG Oral Tab Take 2 tablets (1,000 mg) by mouth 2 times a day.      Alpha-Lipoic Acid 300 MG tablet Take 1 tablet by mouth every morning. Benfotiamine- Alpha Lipoic acid      amLODIPine 5 MG tablet Take 1 tablet (5 mg) by mouth 2 times a day. 180 tablet 3  atorvastatin 10 MG tablet Take 1 tablet (10 mg) by mouth daily. (Patient taking differently: Take 1 tablet (10 mg) by mouth every morning.) 90 tablet 3    BENFOTIAMINE OR Take 250 mg by mouth every morning.      betamethasone dipropionate 0.05 % lotion Apply topically 2 times a day. Apply to scalp and ears as needed for itching (Patient taking differently: Apply 1 application  topically 2 times a day as needed. Apply to scalp and ears as needed for itching) 60 mL 11    Cholecalciferol (VITAMIN D3) 2000 units Oral Cap Take 1 capsule (2,000 Units) by mouth daily. For low level (Patient taking differently: Take 1 capsule (2,000 units) by mouth every morning. For low level) 1 capsule 1    clindamycin 1 % gel Apply 1 application  topically 2 times a day as needed. Apply to back.      CLINDAMYCIN HCL OR Take by mouth. CLINDAMYCIN CREAM      Cyanocobalamin 1000 MCG Oral Tab one per day over-the-counter started 5/21 /2012 this level borderline low (Patient taking differently: Take 1 tablet (1,000  mcg) by mouth every morning.) 1 Tab 1    diclofenac 1 % gel Apply 4 g topically 4 times a day. Apply to 2 gram four times daily to neck, trapezius muscle, and low back. Use a max of 32 grams a day. (Patient taking differently: Apply 4 g topically 4 times a day as needed. Apply to 2 gram four times daily to neck, trapezius muscle, and low back. Use a max of 32 grams a day.) 300 g 4    DULoxetine 60 MG DR capsule Take 1 capsule (60 mg) by mouth 2 times a day. 180 capsule 2    EPINEPHrine 0.3 MG/0.3ML auto-injector INJECT INTRAMUSCULARLY ONCE AS NEEDED FOR SEVERE ALLERGIC REACTION 4 each 3    ibuprofen 200 MG tablet Take 1 tablet (200 mg) by mouth daily as needed (pain).      lidocaine 5 % patch Apply 1 patch onto the skin daily. Apply to painful area for up to 12 hours in a 24 hour period. (Patient taking differently: Apply 1 patch onto the skin daily as needed. Apply to painful area for up to 12 hours in a 24 hour period.) 30 patch 2    lisinopril 20 MG tablet Take 1 tablet (20 mg) by mouth 2 times a day. 180 tablet 3    naltrexone 50 MG tablet TAKE 1 TABLET BY MOUTH DAILY WITH FOOD 30 tablet 0    Omega-3 Fatty Acids (OMEGA 3 OR) Take 1 tablet by mouth every morning.      oxyCODONE 5 MG tablet Take 1 tablet (5 mg) by mouth every 6 hours as needed for severe pain. 30 tablet 0    pregabalin 300 MG capsule Take 1 capsule (300 mg) by mouth 2 times a day. 180 capsule 1    primidone 50 MG tablet Take 1 tablet (50 mg) by mouth 2 times a day. 180 tablet 4     No facility-administered medications prior to visit.        Objective:   There were no vitals filed for this visit.     The dressings are clean, dry and intact with mild dry blood.  The surgical sites were examined and the incision(s) has/have well approximate with no dehiscence and no erythema.  There is minimal pain on surgical site.  There is mild swelling and is no erythema noted.  There is no  signs of DVT i.e. no calf pain, swelling and redness.  Clinically, the  post-operative alignments is good.   There is no evidence of allodynia.              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 The patient appears to be NON COMPLIANT regarding post-operative instruction.    Plan:      A discussion took place regarding post-operative progress.  Patient was instructed to call our office if they notice any significant change is post-operative progress and experience severe pain, swelling, fever/chill, calf pain, SOB.  Using sterile suture kit, sutures were removed without complication by Dr. Maudie Mercury and Steri strip was applied.  Precaution were stressed today regards to level of activity/elevation/icing.   We have reviewed today's x-ray finding and detail discussion took place with the patient.       Ambulatory status:  The patient was instructed to protective weight bearing using post-op shoe.        A post op shoes was fitted and dispensed today by Shauna Hugh MA.  Appropriate use and instruction was given.       DVT prophylaxis:  none - patient is ambulating.      Patient allowed activities: shower starting tomorrow.       Pain management:  The patient was instructed to use rest, ice, elevation, compression and NSAID as primary care for pain and swelling.  At this point we agree to use:  Tylenol.      Physical therapy:  none.        Return/Follow-up:  follow up in 4-5 weeks for post op check and x-ray AP/LAT/MO views foot    Vira Browns DPM, FACFAS  Reconstructive Ankle and Foot Surgeon  [facility]

## 2022-05-28 ENCOUNTER — Encounter (INDEPENDENT_AMBULATORY_CARE_PROVIDER_SITE_OTHER): Payer: Self-pay | Admitting: Podiatrist

## 2022-06-03 ENCOUNTER — Encounter (INDEPENDENT_AMBULATORY_CARE_PROVIDER_SITE_OTHER): Payer: Medicare PPO | Admitting: Physical Medicine & Rehabilitation

## 2022-06-04 NOTE — Progress Notes (Unsigned)
PROCEDURE NOTE  TRIGGER POINT INJECTIONS UNDER ULTRASOUND GUIDANCE, # 1 of 1  06/08/2022    PRE-PROCEDURE DIAGNOSIS  (M79.18) Myofascial pain  (primary encounter diagnosis).    POST-PROCEDURE DIAGNOSIS  (M79.18) Myofascial pain  (primary encounter diagnosis).    INDICATION FOR INJECTION  Chronic left neck pain with myofascial components.  I described the disorder in terms that were understandable to the patient. I reviewed the risks, benefits, and alternative techniques for treatment as well as the option of no treatment. Patient was encouraged to ask questions and expressed understanding and acceptance.    PERFORMING PROVIDER: Rock Nephew, PA-C    SEDATION: NO    NPO STATUS:  NO    Guidance:  Ultrasound guidance used for this procedure, appropriate images viewed and saved to the medical record.    DESCRIPTION OF THE PROCEDURE  Position: {pt. position:107797}  Monitoring: standard    Preparation: The injection area was prepped with Chloraprep.  Imaging for needle placement: Linear transducer placed over the painful areas.  Needle used: 25g, 1.5 inch.  Needle insertion: Out-of plane.  Imaging conditions: Good visualization of the muscle planes.    Ultrasound exam identifies the target muscles and fascia layers to be injected, with special attention paid to nearby neurovascular structures.    A twitch response was elicited at each site prior to injection. After negative aspiration, a solution containing ***mL of 0.25% Bupivacaine without epinephrine was distributed evenly between {#:10011} targets under live ultrasound visualization in the following muscle regions:   ***    Uneventful removal of the needle. Ultrasound images during the guided injections were saved.    POST-PROCEDURE:  He tolerated the procedure well and was monitored to be in stable condition where he made an uneventful recovery.    Mr. Lacher was reassessed after the procedure.  The pain score was  {Numbers 0-10:107815}/10 before the procedure and  {Numbers 0-10:107815}/10 prior to discharge (0 = no pain, 10 = worst pain imaginable).  Mr. Speckman did note initial improvement in pain, including pain with movement.     COMPLICATIONS:  {EPPI:951884}    PLAN  Mr. Camino  was instructed to follow up in 2-3 weeks to review results of this procedure and for further treatment planning.      Rock Nephew, PA-C  Physician [place] [facility] [place] [facility]  Department of Anesthesiology & Pain Medicine

## 2022-06-08 ENCOUNTER — Ambulatory Visit: Payer: Medicare PPO | Attending: Medical | Admitting: Medical

## 2022-06-08 VITALS — BP 155/93 | HR 80 | Temp 98.4°F | Wt 225.0 lb

## 2022-06-08 DIAGNOSIS — M542 Cervicalgia: Secondary | ICD-10-CM | POA: Insufficient documentation

## 2022-06-08 DIAGNOSIS — M7918 Myalgia, other site: Secondary | ICD-10-CM | POA: Insufficient documentation

## 2022-06-08 DIAGNOSIS — M47812 Spondylosis without myelopathy or radiculopathy, cervical region: Secondary | ICD-10-CM | POA: Insufficient documentation

## 2022-06-08 NOTE — Patient Instructions (Signed)
It was my pleasure to help take part in your care today.  Thank you for coming to visit us at the [facility]. Please contact our clinic if you have any questions or concerns.    Here is a copy of a draft of our suggestions and other pain information.     You underwent trigger point injections today. Over the next day or so, it may feel uncomfortable/sore at the procedure sites, but this should resolve over the next day or two. If the discomfort is bothersome, I recommend applying lidocaine 1% gel to the procedure site or taking an additional dose of acetaminophen (Tylenol), ibuprofen, or naproxen (Aleve).  If any other issues occur, please contact the clinic for further guidance.    Otherwise, hopefully the procedure helped, and we will plan to see you back in 2 to 3 weeks for a postprocedure follow-up to review the results and for further treatment planning.    Please update PainTracker at [email or web address] prior to your next visit to the [facility].   For log in assistance or to reset your PainTracker, please call clinic at [phone], #[phone].

## 2022-06-08 NOTE — Progress Notes (Signed)
Review of Systems   Constitutional: Negative.    HENT: Negative.    Eyes: Negative.    Respiratory: Negative.    Cardiovascular: Negative.    Gastrointestinal: Negative.    Endocrine: Negative.    Genitourinary: Negative.    Musculoskeletal: Positive for arthralgias, back pain, gait problem, neck pain and neck stiffness.   Skin: Negative.    Allergic/Immunologic: Negative.    Hematological: Negative.    Psychiatric/Behavioral: Negative.

## 2022-06-09 ENCOUNTER — Ambulatory Visit (HOSPITAL_BASED_OUTPATIENT_CLINIC_OR_DEPARTMENT_OTHER): Payer: Medicare PPO | Admitting: Student in an Organized Health Care Education/Training Program

## 2022-06-17 NOTE — Progress Notes (Signed)
[facility] Follow Up Visit  06/22/2022    Ronnie Mason;  MRN: [id number];   DOB: 1951-07-24    CHIEF COMPLAINT:   Chief Complaint   Patient presents with    Follow-Up     Neck Pain    Shoulder Pain     Shoulder pain is better since last injection       HISTORY OF PRESENT ILLNESS:    The purpose of today's visit is interval reevaluation of bilateral neck and shoulder pain s/p trigger point injections on 06/08/2022. The patient has a pain history significant for cervical radiculopathy, cervical stenosis, multilevel neuroforaminal stenosis, and myofascial pain. The patient was last seen by myself on 06/08/2022.    The patient has had 90-100% pain relief in the lower left neck and shoulder since the recent procedure (cervical and upper back trigger point injections).  The relief has been sustained.    Today, he mostly complains of lower back pain, which he feels is similar to his recently treated left neck and upper shoulder pain.    Of note, he is no longer wearing a boot from his recent surgery.    Labs/Imaging Reviewed:  02/05/2021 XR L SPINE 2-3 VW:  IMPRESSION  FINDINGS/IMPRESSION:  Please note that the lateral radiograph is suboptimal in quality. There is a convex curvature of the lumbar spine measuring 12 degrees (measured from T12 to L3), with mild bilateral listhesis of L3 on L4. AP Alignment is grossly anatomic.     No acute fracture or traumatic subluxation.  Vertebral body heights are grossly preserved.     Multilevel degenerative changes are present including loss of disc height, associated endplate sclerosis, and  osteophytosis.  Facet arthropathy is most significant at L5-S1.    04/14/2022 XR C SPINE 4-5 VW:  FINDINGS AND  IMPRESSION  In the neutral position, a grade 1-2 anterolisthesis at the C4-5 level is similar to December 2022. No other spinal malalignment.     There is good range of motion with neck flexion. Limited range of motion with neck extension. The C4-5 anterolisthesis is  unchanged in both flexion and extension. No dynamic instability demonstrated.     No acute fracture is seen.     Multilevel degenerative disc disease. Degenerative changes are also noted in the facet joints at multiple levels bilaterally.     No abnormal prevertebral soft tissue swelling.    09/03/2021 MRI C SPINE W/O CONTRAST:  IMPRESSION  1. Multilevel lumbar spondylosis as detailed above more prominence at C4-C5 where there is mild to moderate central and moderate bilateral foraminal narrowing.  2. Multilevel moderate to severe neural foraminal stenosis at other levels as detailed above.      REVIEW OF SYSTEMS:  The review of systems documented by our clinic staff was provided by the patient. I did review the ROS and have no further detail to add.    PHYSICAL EXAMINATION:  BP (!) 141/86   Pulse 86   Temp 36.6 C   Ht 6\' 4"  (1.93 m) Comment: pt reported  Wt (!) 102.1 kg (225 lb) Comment: pt reported  SpO2 (P) 93%   BMI 27.39 kg/m    General:  healthy-appearing  well-nourished  no acute distress  Mental Status:  active  normal mood  normal affect  Alert and oriented x3  Head:  normal appearing  Shoulders:  mild pain with flexion  mild pain with extension  mild pain with external rotation  mild pain with internal  rotation  mild tenderness over the left acromioclavicular joint  mild tenderness over the left glenohumeral joint  negative empty can test bilaterally   Cervical spine:   no pain with flexion  mild pain with extension  mild tenderness over the lower facet joints and paraspinous muscles to palpation  mild pain with extension and left facet loading  positive Spurling's test left  Lumbar spine:   mild pain with flexion  moderate pain with extension  moderate tenderness over the lower facet joints and paraspinous muscles to palpation  moderate pain with extension and right facet loading  negative straight leg raise (SLR) bilaterally  Multiple muscle bands palpated over the left paraspinals  Gait:  antalgic  gait  forward station  Currently in walking boot on right foot  Neuro:  Cranial nerves 2-12 grossly intact  Skin:  No obvious rashes, lesions, or erythema seen     IMPRESSION:   Ronnie Mason is a 71 year old male with left-sided cervical radiculopathy likely secondary to cervical stenosis and multilevel neuroforaminal stenosis and cervical myofascial pain.  His clinical presentation is also consistent with lumbar myofascial pain.    The patient appears to have done well from his recent cervical and left shoulder trigger point injections. He most recently underwent an interlaminar cervical epidural steroid injection in June 2023, approximately 4 months ago.  As of now, he reports that his neck pain is manageable.    We discussed that his low back clinical presentation could be consistent with lumbar myofascial pain versus lumbar facet joint pain, especially with facet arthropathy most significant at L5-S1 as found on his lumbar spine x-ray from 02/05/2021.  Various treatment options were discussed with the patient including trigger point injections versus MBB/RFA.  At this time, he would like to proceed with a more conservative treatment option with TPI's first.    PLAN:     We discussed our impressions and the following suggestions/options in detail and  provided him with information in the after visit summary.  Mr. Steppe had no further questions.    Diagnostic evaluation: None.    Rehabilitation: Physical therapy once his pain is better controlled.  Medication suggestions: None.    Procedure suggestions: Trigger point injections to the left lower back region.  Series of 3, 2 weeks apart.    Follow-up: For procedure.      Jonni Sanger, PA-C  Physician Assistant - Faculty Teaching Associate  [facility] - [facility]  Department of Anesthesiology & Pain Medicine

## 2022-06-18 ENCOUNTER — Ambulatory Visit (HOSPITAL_BASED_OUTPATIENT_CLINIC_OR_DEPARTMENT_OTHER): Payer: Medicare PPO | Admitting: Neurology

## 2022-06-22 ENCOUNTER — Ambulatory Visit: Payer: Medicare PPO | Attending: Medical | Admitting: Medical

## 2022-06-22 VITALS — BP 141/86 | HR 86 | Temp 97.8°F | Ht 76.0 in | Wt 225.0 lb

## 2022-06-22 DIAGNOSIS — M7918 Myalgia, other site: Secondary | ICD-10-CM | POA: Insufficient documentation

## 2022-06-22 DIAGNOSIS — M545 Low back pain, unspecified: Secondary | ICD-10-CM | POA: Insufficient documentation

## 2022-06-22 DIAGNOSIS — M47816 Spondylosis without myelopathy or radiculopathy, lumbar region: Secondary | ICD-10-CM | POA: Insufficient documentation

## 2022-06-22 DIAGNOSIS — M542 Cervicalgia: Secondary | ICD-10-CM | POA: Insufficient documentation

## 2022-06-22 DIAGNOSIS — G8929 Other chronic pain: Secondary | ICD-10-CM | POA: Insufficient documentation

## 2022-06-22 NOTE — Progress Notes (Signed)
Review of Systems   Musculoskeletal:  Positive for back pain, gait problem, myalgias, neck pain and neck stiffness.

## 2022-06-23 NOTE — Patient Instructions (Addendum)
Recommend use of Tuli's hammer toe straightener for 4 months from surgery date.  Use by pulling down on hammertoe and securing the straps with the toe in the "down" position.  Purchase from [email or web address].  May use tape as an alternative.                Done today:     X-rays of right foot, 4 views: ap, lat, mo, lo.  Continue to tape down 2nd toe for ~4 months from surgery date to prevent dorsal contracture.             Weight bearing status/ activity restrictions:    Weightbearing progression: nonweightbearing --> partial weighbearing in boot + assistive device--> full weightbearing in boot --> full weightbearing in shoes     Continue full weightbearing in stable shoes.  Avoid using unstable shoes: flip-flops, flimsy slippers.  Do not walk barefoot. Use slippers with a supportive sole when walking around at home.  Use inserts/custom orthotics in shoes.           Physical therapy:   Not needed.     DVT prophylaxis:    None; patient is ambulating.           Pain management:     Continue to elevate foot at heart level and above if pain begins to increase.   Continue to elevate as often as you can.  Continue to elevate even after you have transitioned into regular shoes. Swelling may be a cause of increased pain.  Recommend use of ace-wrap to decrease swelling.  May use acetaminophen/ibuprofen for pain and swelling.  For acetaminophen: avoid consuming alcohol, do not take more than 4,000 mg in 24 hours.  For ibuprofen: take with food, do not take more than 2,400 mg in 24 hours  You should not use ibuprofen if you have:  A bleeding disorder, kidney disease, GI/stomach issues, liver disease.       Return/follow-up:  With [name] on 08/05/21 for follow-up.

## 2022-06-23 NOTE — Progress Notes (Unsigned)
Ronnie Mason  [id number]    POST-OP VISIT    PROCEDURES:  CORRECTION, RIGHT MALLET TOE, 2nd TOE, RIGHT  CORRECTION, RIGHT MALLET TOE, 3rd TOE, RIGHT     DOS: 05/13/22     LATERALITY: RIGHT     PO WEEK #: 6 weeks, 1 day     SURGEON: Dr. Wilmon Arms, DPM       Patient is accompanied to today's appointment by: spouse      SUBJECTIVE:  Ronnie Mason returns today for: his 6 weeks post-op follow-up.    Overall, he has been feeling well since surgery with no specific concerns or issues to discuss today.       Pain level:  Toes have been sore constantly.    Post-op pain medications & schedule:  Acetaminophen and ibuprofen prn for back and neck   DVT prophylaxis:  None; patient is ambulating.     Weight-bearing status:  Protected wb in tennis shoes. Transitioned into tennis shoes 1 week after 05/27/22.   Falls/mishaps:  none         OBJECTIVE:  There were no vitals taken for this visit.    Habitus: Well developed. Well nourished. There is no height or weight on file to calculate BMI.  Level of comfort: sitting up on exam table, in no apparent distress, alert and oriented to person, place and time.   Psych: appropriately sociable and reciprocal, normal mood, good eye contact  LOWER EXTREMITIES:   Vascular: warm and well perfused. Pedal pulses 2+. Toes blanch with pressure, capillary refill time brisk. +mild edema    Dermatological: normal texture/turgor.  Surgical sites are well healed with no signs of infection.    Neurologic: no hypersensitivity to light touch throughout bilateral lower extremities. No weakness with ankle ROM in all planes. No pathological reflexes noted, negative for fasciculations.    Musculoskeletal: no calf swelling or tenderness bilaterally. +Deconditioned muscle status present as expected.  +mild dorsal contracture right 2nd toe.      ASSESSMENT:    6 weeks, 1 day s/p above procedure and overall doing well.     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  Continue to tape down 2nd toe for ~4 months from surgery date to prevent dorsal contracture.             Weight bearing status/ activity restrictions:    Weightbearing progression: nonweightbearing --> partial weighbearing in boot + assistive device--> full weightbearing in boot --> full weightbearing in shoes     Continue full weightbearing in stable shoes.  Avoid using unstable shoes: flip-flops, flimsy slippers.  Do not walk barefoot. Use slippers with a supportive sole when walking around at home.  Use inserts/custom orthotics in shoes.           Physical therapy:   Not needed.     DVT prophylaxis:    None; patient is ambulating.           Pain management:     Continue to elevate foot at heart level and above if pain begins to increase.   Continue to elevate as often as you can.  Continue to elevate even after you have transitioned into regular shoes. Swelling may be a cause of increased pain.  Recommend  use of ace-wrap to decrease swelling.  May use acetaminophen/ibuprofen for pain and swelling.  For acetaminophen: avoid consuming alcohol, do not take more than 4,000 mg in 24 hours.  For ibuprofen: take with food, do not take more than 2,400 mg in 24 hours  You should not use ibuprofen if you have:  A bleeding disorder, kidney disease, GI/stomach issues, liver disease.       Return/follow-up:  With [name] on 08/05/21 for follow-up.       All the patient's questions were answered. Communication was emphasized to the patient to call the office immediately if there is any worsening of their current status.      No orders of the defined types were placed in this encounter.      There are no Patient Instructions on file for this visit.          Oneta Rack, PA-C  Teaching Associate  Department of Orthopaedics and Sports Medicine  [facility]

## 2022-06-24 ENCOUNTER — Telehealth (INDEPENDENT_AMBULATORY_CARE_PROVIDER_SITE_OTHER): Payer: Self-pay

## 2022-06-24 ENCOUNTER — Encounter (INDEPENDENT_AMBULATORY_CARE_PROVIDER_SITE_OTHER): Payer: Self-pay | Admitting: Physician Assistant

## 2022-06-24 ENCOUNTER — Ambulatory Visit (INDEPENDENT_AMBULATORY_CARE_PROVIDER_SITE_OTHER): Payer: Medicare PPO | Admitting: Physician Assistant

## 2022-06-24 ENCOUNTER — Inpatient Hospital Stay (INDEPENDENT_AMBULATORY_CARE_PROVIDER_SITE_OTHER): Admit: 2022-06-24 | Discharge: 2022-06-24 | Disposition: A | Discharge status: Home / Self Care | Payer: Medicare PPO

## 2022-06-24 DIAGNOSIS — M205X1 Other deformities of toe(s) (acquired), right foot: Secondary | ICD-10-CM

## 2022-06-24 DIAGNOSIS — Z9889 Other specified postprocedural states: Secondary | ICD-10-CM

## 2022-06-24 NOTE — Telephone Encounter (Signed)
Patient is due for Medicare AWV.    Mychart message sent to schedule AWV.    Please contact patient to schedule AWV.    Thanks!

## 2022-06-24 NOTE — Progress Notes (Signed)
Post op right foot DOS 05/12/22  Has occasional soreness. Taking tyl and ibu   WB in regular shoes

## 2022-06-29 ENCOUNTER — Encounter (HOSPITAL_BASED_OUTPATIENT_CLINIC_OR_DEPARTMENT_OTHER): Payer: Self-pay | Admitting: Medical

## 2022-06-29 ENCOUNTER — Ambulatory Visit: Payer: Medicare PPO | Attending: Medical | Admitting: Medical

## 2022-06-29 DIAGNOSIS — G8929 Other chronic pain: Secondary | ICD-10-CM | POA: Insufficient documentation

## 2022-06-29 DIAGNOSIS — M545 Low back pain, unspecified: Secondary | ICD-10-CM | POA: Insufficient documentation

## 2022-06-29 DIAGNOSIS — M7918 Myalgia, other site: Secondary | ICD-10-CM | POA: Insufficient documentation

## 2022-06-29 NOTE — Progress Notes (Signed)
Review of Systems   Constitutional: Negative.    HENT: Negative.     Eyes: Negative.    Respiratory: Negative.     Cardiovascular: Negative.    Gastrointestinal: Negative.    Endocrine: Negative.    Genitourinary: Negative.    Musculoskeletal:  Positive for back pain and myalgias.   Skin: Negative.    Allergic/Immunologic: Negative.    Neurological: Negative.    Hematological: Negative.    Psychiatric/Behavioral: Negative.

## 2022-06-29 NOTE — Patient Instructions (Signed)
It was my pleasure to help take part in your care today.  Thank you for coming to visit us at the [facility]. Please contact our clinic if you have any questions or concerns.    Here is a copy of a draft of our suggestions and other pain information.     You underwent trigger point injections today. Over the next day or so, it may feel uncomfortable/sore at the procedure sites, but this should resolve over the next day or two. If the discomfort is bothersome, I recommend applying lidocaine 1% gel to the procedure site or taking an additional dose of acetaminophen (Tylenol), ibuprofen, or naproxen (Aleve).  If any other issues occur, please contact the clinic for further guidance.    Otherwise, hopefully the procedure helped, and we will plan to see you back in 2 to 3 weeks for your next procedure.    Please update PainTracker at [email or web address] prior to your next visit to the  [facility].   For log in assistance or to reset your PainTracker, please call clinic at [phone], #[phone].

## 2022-06-29 NOTE — Progress Notes (Signed)
PROCEDURE NOTE  TRIGGER POINT INJECTIONS UNDER ULTRASOUND GUIDANCE, # 1 of 3  06/29/2022    PRE-PROCEDURE DIAGNOSIS  (M79.18) Myofascial pain  (M54.50,  G89.29) Chronic bilateral low back pain without sciatica.    POST-PROCEDURE DIAGNOSIS  (M79.18) Myofascial pain  (M54.50,  G89.29) Chronic bilateral low back pain without sciatica.    INDICATION FOR INJECTION  Chronic low back pain with myofascial components.  I described the disorder in terms that were understandable to the patient. I reviewed the risks, benefits, and alternative techniques for treatment as well as the option of no treatment. Patient was encouraged to ask questions and expressed understanding and acceptance.    PERFORMING PROVIDER: Jonni Sanger, PA-C    SEDATION: NO    NPO STATUS:  NO    Guidance:  Ultrasound guidance used for this procedure, appropriate images viewed and saved to the medical record.    DESCRIPTION OF THE PROCEDURE  Position: prone  Monitoring: standard    Preparation: The injection area was prepped with Chloraprep.  Imaging for needle placement: Linear transducer placed over the painful areas.  Needle used: 25g, 1.5 inch.  Needle insertion: Out-of plane.  Imaging conditions: Good visualization of the muscle planes.    Ultrasound exam identifies the target muscles and fascia layers to be injected, with special attention paid to nearby neurovascular structures.    A twitch response was elicited at each site prior to injection. After negative aspiration, a solution containing 10 mL of 0.25% Bupivacaine without epinephrine was distributed evenly between 10 targets under live ultrasound visualization in the following muscle regions:   Left latissimus dorsi  Left lumbar erector spinae  Left serratus posterior inferior  Left internal oblique  Left external oblique    Uneventful removal of the needle. Ultrasound images during the guided injections were saved.    POST-PROCEDURE:  He tolerated the procedure well and was monitored to be in  stable condition where he made an uneventful recovery.    Mr. Degante was reassessed after the procedure.  The pain score was  6/10 before the procedure and 4/10 prior to discharge (0 = no pain, 10 = worst pain imaginable).  Mr. Skeels did note initial improvement in pain, including pain with movement.     COMPLICATIONS:  none     ASSESSMENT/PLAN:  Mr. Vitug was instructed to follow up in 2-3 weeks for the next scheduled procedure.      Jonni Sanger, PA-C  Physician Assistant - Faculty Teaching Associate  [facility] - [facility]  Department of Anesthesiology & Pain Medicine

## 2022-07-06 NOTE — Telephone Encounter (Signed)
Scheduled patient for AWV on 08/27/2022.

## 2022-07-08 NOTE — Progress Notes (Signed)
PROCEDURE NOTE  TRIGGER POINT INJECTIONS UNDER ULTRASOUND GUIDANCE, #2 of 3  07/14/2022    PRE-PROCEDURE DIAGNOSIS  (M79.18) Myofascial pain  (primary encounter diagnosis)  (M54.50,  G89.29) Chronic bilateral low back pain without sciatica  (G89.4) Chronic pain syndrome.    POST-PROCEDURE DIAGNOSIS  (M79.18) Myofascial pain  (primary encounter diagnosis)  (M54.50,  G89.29) Chronic bilateral low back pain without sciatica  (G89.4) Chronic pain syndrome.    INDICATION FOR INJECTION  Chronic low back pain with myofascial components.  I described the disorder in terms that were understandable to the patient. I reviewed the risks, benefits, and alternative techniques for treatment as well as the option of no treatment. Patient was encouraged to ask questions and expressed understanding and acceptance.    PERFORMING PROVIDER: Jonni Sanger, PA-C    SEDATION: NO    NPO STATUS:  NO    Guidance:  Ultrasound guidance used for this procedure, appropriate images viewed and saved to the medical record.    DESCRIPTION OF THE PROCEDURE  Position: prone  Monitoring: standard    Preparation: The injection area was prepped with Chloraprep.  Imaging for needle placement: Linear transducer placed over the painful areas.  Needle used: 25g, 1.5 inch.  Needle insertion: Out-of plane.  Imaging conditions: Good visualization of the muscle planes.    Ultrasound exam identifies the target muscles and fascia layers to be injected, with special attention paid to nearby neurovascular structures.    A twitch response was elicited at each site prior to injection. After negative aspiration, a solution containing 11 mL of 0.25% Bupivacaine without epinephrine was distributed evenly between 10 targets under live ultrasound visualization in the following muscle regions:   Left latissimus dorsi  Left lumbar erector spinae  Left serratus posterior inferior  Left internal oblique  Left external oblique    Uneventful removal of the needle. Ultrasound  images during the guided injections were saved.    POST-PROCEDURE:  He tolerated the procedure well and was monitored to be in stable condition where he made an uneventful recovery.    Mr. Konigsberg was reassessed after the procedure.  The pain score was 7/10 before the procedure and 5-6/10 prior to discharge (0 = no pain, 10 = worst pain imaginable).  Mr. Palin did note initial improvement in pain, including pain with movement.     COMPLICATIONS:  none     ASSESSMENT/PLAN:  Mr. Viernes was instructed to follow up in 2-3 weeks for the next scheduled procedure.    He reports that he received good relief over a few days after his last TPIs. He also reports that he is undergoing an EMG with his neurologist next week.      Jonni Sanger, PA-C  Physician Assistant - Faculty Teaching Associate  [facility] - [facility]  Department of Anesthesiology & Pain Medicine

## 2022-07-10 ENCOUNTER — Ambulatory Visit (INDEPENDENT_AMBULATORY_CARE_PROVIDER_SITE_OTHER): Payer: Medicare PPO | Admitting: Acupuncturist

## 2022-07-10 ENCOUNTER — Telehealth (INDEPENDENT_AMBULATORY_CARE_PROVIDER_SITE_OTHER): Payer: Self-pay | Admitting: Acupuncturist

## 2022-07-10 NOTE — Telephone Encounter (Signed)
Pt was no show today's acupuncture appointment with Dr. Augusto Gamble @ 2 pm. I called pt's preferred phone number and left message to inform pt missed his acupuncture appointment today @ 2 pm. Pt also has another acupuncture appointment with Dr. Fredric Mare on Thursday, 07/16/22 @ 1 pm, so he should show up 15 minuted prior to the appointment time at upcoming acupuncture appointment.

## 2022-07-13 ENCOUNTER — Ambulatory Visit: Payer: Medicare PPO | Attending: Clinical & Laboratory Immunology | Admitting: Neurology

## 2022-07-13 VITALS — BP 130/88 | HR 74 | Ht 76.0 in | Wt 225.0 lb

## 2022-07-13 DIAGNOSIS — G609 Hereditary and idiopathic neuropathy, unspecified: Secondary | ICD-10-CM | POA: Insufficient documentation

## 2022-07-13 LAB — LAB ADD ON ORDER

## 2022-07-13 NOTE — Progress Notes (Signed)
Active Ambulatory Problems     Diagnosis Date Noted    Syncope     Hypotension     Carotid Sinus Hypersensitivity     URI (upper respiratory infection)     Cervicalgia 03/11/2010    Hyperlipidemia 09/26/2012    Chronic pain 12/21/2012    Bee sting allergy 12/21/2012    Numbness and tingling of leg 12/21/2012    Chronic back pain 12/21/2012    B12 deficiency 12/21/2012    Chronic renal insufficiency, stage III (moderate) (HCC) 12/21/2012    Back pain, lumbosacral 02/15/2013    Lumbar radiculopathy, chronic 02/15/2013    Neck pain 02/15/2013    Fall 02/23/2013    Tremor 04/20/2013    Sensory neuropathy 01/18/2014    Spondylosis of cervical region without myelopathy or radiculopathy 05/28/2014    Idiopathic peripheral neuropathy 03/15/2015    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 [facility]) 05/07/2015    History of alcohol dependence (HCC) 06/26/2015    Cerebral ventriculomegaly 10/09/2015    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    Essential hypertension 12/16/2015    Essential tremor 12/16/2015    Hx of spontan intraparenchymal intracran bleed assoc with hypertension 12/16/2015    Alcohol abuse 12/16/2015    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    Balance problem 01/23/2016    Chronic pain of both shoulders 01/23/2016    Vitamin D deficiency 02/13/2016    Difficulty in walking, not elsewhere classified 03/12/2016    Impaired mobility and ADLs 04/08/2016    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(HCC) 08/06/2016    Primary osteoarthritis of right hip 01/11/2017    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    Right lumbar radiculitis 05/05/2017    Greater trochanteric pain syndrome of right lower extremity 11/25/2017    Complex tear of medial meniscus of left knee as current injury 09/08/2017    Primary osteoarthritis of left knee 09/08/2017    Acute left-sided low back pain without sciatica 03/14/2018    Chronic left-sided low back pain without sciatica 06/21/2018    Enuresis 09/06/2018     Closed fracture of one rib of right side 01/26/2019    MRSA (methicillin resistant staph aureus) culture positive 07/13/2019    Myofascial pain 10/20/2019    Macrocytosis without anaemia 02/12/2020    Psychological factors affecting medical condition 04/24/2021    Chronic bilateral low back pain without sciatica 04/29/2021    Mallet toe, right 04/23/2022     Resolved Ambulatory Problems     Diagnosis Date Noted    Lumbosacral radiculopathy at S1 03/22/2013    Intraparenchymal hemorrhage of brain (HCC) 01/06/2016    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) 01/15/2016    Pain of right hip joint 01/23/2016    Alcoholism in remission (HCC) 03/16/2019    Abscess of foot 12/02/2020    Cellulitis and abscess of foot 12/02/2020    Abscess 12/02/2020    Abscess of right foot 12/10/2020     Past Medical History:   Diagnosis Date    Allergic rhinitis due to allergen     Disc disorder of lumbosacral region     Hip injury     HYPERTENSION      Stroke (HCC) 2017       Current Outpatient Medications:     Acetaminophen 500 MG Oral Tab, Take 2 tablets (1,000 mg) by mouth 2 times a day., Disp: , Rfl:     Alpha-Lipoic Acid 300 MG tablet, Take 1 tablet by mouth every morning.  Benfotiamine- Alpha Lipoic acid, Disp: , Rfl:     amLODIPine 5 MG tablet, Take 1 tablet (5 mg) by mouth 2 times a day., Disp: 180 tablet, Rfl: 3    atorvastatin 10 MG tablet, Take 1 tablet (10 mg) by mouth daily. (Patient taking differently: Take 1 tablet (10 mg) by mouth every morning.), Disp: 90 tablet, Rfl: 3    BENFOTIAMINE OR, Take 250 mg by mouth every morning., Disp: , Rfl:     betamethasone dipropionate 0.05 % lotion, Apply topically 2 times a day. Apply to scalp and ears as needed for itching (Patient taking differently: Apply 1 Application  topically 2 times a day as needed. Apply to scalp and ears as needed for itching), Disp: 60 mL, Rfl: 11    Cholecalciferol (VITAMIN D3) 2000 units Oral Cap, Take 1 capsule (2,000 Units) by mouth daily. For low  level (Patient taking differently: Take 1 capsule (2,000 units) by mouth every morning. For low level), Disp: 1 capsule, Rfl: 1    clindamycin 1 % gel, Apply 1 application  topically 2 times a day as needed. Apply to back., Disp: , Rfl:     CLINDAMYCIN HCL OR, Take by mouth. CLINDAMYCIN CREAM (Patient not taking: Reported on 07/13/2022), Disp: , Rfl:     Cyanocobalamin 1000 MCG Oral Tab, one per day over-the-counter started 5/21 /2012 this level borderline low (Patient taking differently: Take 1 tablet (1,000 mcg) by mouth every morning.), Disp: 1 Tab, Rfl: 1    diclofenac 1 % gel, Apply 4 g topically 4 times a day. Apply to 2 gram four times daily to neck, trapezius muscle, and low back. Use a max of 32 grams a day. (Patient taking differently: Apply 4 g topically 4 times a day as needed. Apply to 2 gram four times daily to neck, trapezius muscle, and low back. Use a max of 32 grams a day.), Disp: 300 g, Rfl: 4    DULoxetine 60 MG DR capsule, Take 1 capsule (60 mg) by mouth 2 times a day., Disp: 180 capsule, Rfl: 2    EPINEPHrine 0.3 MG/0.3ML auto-injector, INJECT INTRAMUSCULARLY ONCE AS NEEDED FOR SEVERE ALLERGIC REACTION, Disp: 4 each, Rfl: 3    ibuprofen 200 MG tablet, Take 1 tablet (200 mg) by mouth daily as needed (pain)., Disp: , Rfl:     lidocaine 5 % patch, Apply 1 patch onto the skin daily. Apply to painful area for up to 12 hours in a 24 hour period. (Patient taking differently: Apply 1 patch onto the skin daily as needed. Apply to painful area for up to 12 hours in a 24 hour period.), Disp: 30 patch, Rfl: 2    lisinopril 20 MG tablet, Take 1 tablet (20 mg) by mouth 2 times a day., Disp: 180 tablet, Rfl: 3    naltrexone 50 MG tablet, TAKE 1 TABLET BY MOUTH DAILY WITH FOOD, Disp: 30 tablet, Rfl: 0    Omega-3 Fatty Acids (OMEGA 3 OR), Take 1 tablet by mouth every morning., Disp: , Rfl:     pregabalin 300 MG capsule, Take 1 capsule (300 mg) by mouth 2 times a day., Disp: 180 capsule, Rfl: 1    primidone 50  MG tablet, Take 1 tablet (50 mg) by mouth 2 times a day., Disp: 180 tablet, Rfl: 4    Dictation #1  [id number]  [id number]

## 2022-07-14 ENCOUNTER — Ambulatory Visit: Payer: Medicare PPO | Attending: Medical | Admitting: Medical

## 2022-07-14 VITALS — BP 155/92 | HR 79 | Temp 97.2°F

## 2022-07-14 DIAGNOSIS — G8929 Other chronic pain: Secondary | ICD-10-CM | POA: Insufficient documentation

## 2022-07-14 DIAGNOSIS — M7918 Myalgia, other site: Secondary | ICD-10-CM | POA: Insufficient documentation

## 2022-07-14 DIAGNOSIS — G894 Chronic pain syndrome: Secondary | ICD-10-CM | POA: Insufficient documentation

## 2022-07-14 DIAGNOSIS — M545 Low back pain, unspecified: Secondary | ICD-10-CM | POA: Insufficient documentation

## 2022-07-14 NOTE — Progress Notes (Signed)
Review of Systems   Constitutional: Negative.    HENT: Negative.     Eyes: Negative.    Respiratory: Negative.     Cardiovascular: Negative.    Gastrointestinal: Negative.    Endocrine: Negative.    Genitourinary: Negative.    Musculoskeletal:  Positive for arthralgias, back pain, gait problem, myalgias and neck pain.   Skin: Negative.    Allergic/Immunologic: Negative.    Neurological:  Negative for dizziness, tremors, seizures, syncope, facial asymmetry, speech difficulty, weakness, light-headedness, numbness and headaches.   Hematological: Negative.    Psychiatric/Behavioral: Negative.

## 2022-07-14 NOTE — Patient Instructions (Signed)
It was my pleasure to help take part in your care today.  Thank you for coming to visit us at the [facility]. Please contact our clinic if you have any questions or concerns.    Here is a copy of a draft of our suggestions and other pain information.     You underwent trigger point injections today. Over the next day or so, it may feel uncomfortable/sore at the procedure sites, but this should resolve over the next day or two. If the discomfort is bothersome, I recommend the following:   Applying ice to the affected area for 15 minutes, 4 times per day until pain resolves   Applying lidocaine 1% gel to the procedure site   Applying over the counter lidocaine 4% patches to the procedure site  Taking an additional dose of acetaminophen (Tylenol), ibuprofen, or naproxen (Aleve).      If any other issues occur, please contact the clinic for further guidance.    Otherwise, hopefully the procedure helped, and we will plan to see you back in 2 to 3 weeks for your next scheduled procedure.

## 2022-07-14 NOTE — Progress Notes (Signed)
Progress Note     Patient Name: Ronnie Mason, Ronnie P.  Date of Service: July 13, 2022    Patient ID: [id number] Date of Birth: [birth year]    Clinician: Tommi Rumps, MD Facility: [facility]   Location: [facility]     Referring Clinician: Milinda Antis, MD           Dear Casimiro Needle,      Thank you for sending Mr. Kolasinski back for neuromuscular consultation for his lower legs and feet.       As you know, he is a 71 year old right-hand-dominant gentleman with neuropathy that has been present for at least 10 years for ascending gradually to his knees, worse on the lateral calves.  He has not noticed any change in his balance.  He uses a cane once in a while for walking, but not today as he was unable to find it.  His back affects his balance more than his feet. He is to undergo 10 trigger point injections tomorrow.       He has not had any falls in the past year.  He is worse since his prior visit with me in 2018.  He has undergone right foot surgery involving the toes without benefit.  If he pushes on his feet or his lateral shins, that is uncomfortable.  He notes also that he has to take small steps.  He has been on B12 for years.  He notes tremor in his hands.  If he misses his medication he does note that his pain is worse.      EMG nerve conduction study with Dr. Pryor Montes was done  03/13/2015.  Sensory responses in the legs with borderline right sural and normal amplitude motor studies, and mildly prolonged bilateral lower extremity F-wave latencies.  EMG nerve conduction study showed abnormalities on the left side only involving predominantly S1 innervated muscles.  There was a change in F-wave latencies, which raised concern for demyelination, as this had changed since 2015.      On 04/22/2017, EMG nerve conduction study showed normal motor responses on the right and absent right sural and reduced left and no abnormalities in TA gastroc or EDB.     Past laboratory studies include a normal folate, B12 694.   SGOT 52, SGPT 32, normal TSH. In 2019, normal A1c at 5.3%. In 2018, cryoglobulins ANA, HCV antibody, HPV panel, CK, free light chains.  In 2016, CSF protein 61, IgG index normal.  *** ACE, cell count with elevated RBC and normal WBC.  Normal HIV immunofixation serum protein electrophoresis in 2016, rheumatoid factor, ANA immunofixation serum protein electrophoresis in 2015.  In 2014, his B12 was 197.     MRI of the cervical spine with mild-to-moderate central and moderate bilateral foraminal narrowing at C4-C5, severe left C3-C4 and moderate-to-severe left C3-C4, C5-C6, and C6-C7; severe right C6-C7 neural foraminal narrowing.       MRI of the lumbosacral spine 2020 with progression of multilevel degenerative change and epidural lipomatosis, worsened moderate canal stenosis at 3-4 and 5-1 and crowding of the cauda equina nerve roots.  Neural foraminal narrowing is moderate at most severe degree.     MEDICATION ALLERGIES:  REPORTED TO GABAPENTIN.     SOCIAL HISTORY:    He is married and accompanied on this visit by his wife.  He is an Technical sales engineer.  He has never smoked.  He uses two to four alcoholic beverages per week.       REVIEW OF  SYSTEMS:  A full system review detailed in the patient intake questionnaire with leg pain with walking, nocturia, joint pain.       PHYSICAL EXAMINATION:  VITAL SIGNS:  Weight is 102 kilograms/225 pounds.  BMI 27, blood pressure 130/88, pulse 74.    HEAD AND NECK:  Neck flexion and extension strength is full  EXTREMITIES:  No edema or trophic changes in the skin.  Scarring over the right toes and evaluation deferred for the first three.   GENERAL AND MENTAL STATUS:  The patient was alert and cooperative during the interview.  Orientation was normal to self and situation.  Speech was fluent, without dysarthria or paraphasic errors.  CRANIAL NERVES:  Possible mild left esophoria.   MOTOR  FUNCTION:  Normal bulk and tone throughout.  There were no involuntary movements seen.  No contractures  were noted.  Strength:  (Right/Left) Shoulder abduction:  5/5.  Elbow flexors:  5/5.  Elbow extensors:  5/5.  Digit extensors:  5/5.  Digit flexors:  5/5.  Digit abduction:  5/5.  Abductor pollicis:  5/5.  Hip flexors:  5/5.  Knee flexors:  5/5.  Knee extensors:  5/5.  Ankle dorsiflexors:  5/5.  Ankle plantar flexors:  5/5.  Toe extensors:  -/5.  Toe flexors:  4+/4+.  Deep tendon reflexes:  (Right; left): Biceps:  trace; trace.  Triceps:  2+;2+.  Brachioradialis:  trace;trace.  Knees:  2+; 2+.  Ankles: 1+; 0.  Babinski:  0; 0.  Coordination:  There is a mild intermittent high-frequency low amplitude tremor of the extended hands.    GAIT:  Gait is mildly wide based, with a slow steps and steady.  He is able to get up on heels and toes.   SENSORY:  Romberg is negative.  There is decreased vibratory sense at the great toes.  Vibratory sense was diminished to 5 seconds in the great toes, 12 seconds at the ankles.  There is decreased sensation to pin over the entire right lateral leg, the medial foot about halfway up the medial shin and then the right posterior leg to the knee and the left lateral foot and shin about 3/4 of the way up.     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  Sensory findings to pinprick are atypical for length dependent sensorimotor polyneuropathy.  On the left they may reflect S1 and on the right, not clear, but could overlap with L5.  Laboratory testing to date has not identified a cause for his neuropathy.  He does have a significant alcohol use history.  It has been some time since he has had studies done so will repeat ANA, A1c and immunofixation today.  EMG nerve conduction study can evaluate for progression of disease and consider L5 and S1 radiculopathies in the differential.  Thiamine is added on.  He has been supplementing this for long time, but I never saw a level.     We discussed the potential mild neuropathy and  how this can progress over time.  For optimal outcome, I would recommend complete cessation of alcohol use, although overall his exam seems rather stable over time.  I would advocate for continuing whatever B12 supplement he is taking because that keeps him in the normal range.  Tommi Rumps, MD      Date Dictated: 07/13/2022 11:09:13 PM    Date Transcribed: 07/14/2022    BJD/kf   Job #: [id number]      cc: Milinda Antis, MD

## 2022-07-15 ENCOUNTER — Other Ambulatory Visit (HOSPITAL_BASED_OUTPATIENT_CLINIC_OR_DEPARTMENT_OTHER): Payer: Medicare PPO | Admitting: Neurology

## 2022-07-15 ENCOUNTER — Ambulatory Visit: Payer: Medicare PPO | Attending: Neurology

## 2022-07-15 DIAGNOSIS — G609 Hereditary and idiopathic neuropathy, unspecified: Secondary | ICD-10-CM

## 2022-07-16 ENCOUNTER — Telehealth (HOSPITAL_BASED_OUTPATIENT_CLINIC_OR_DEPARTMENT_OTHER): Payer: Self-pay

## 2022-07-16 ENCOUNTER — Ambulatory Visit (INDEPENDENT_AMBULATORY_CARE_PROVIDER_SITE_OTHER): Payer: Medicare PPO | Admitting: Acupuncturist

## 2022-07-16 DIAGNOSIS — F419 Anxiety disorder, unspecified: Secondary | ICD-10-CM

## 2022-07-16 DIAGNOSIS — Z9289 Personal history of other medical treatment: Secondary | ICD-10-CM

## 2022-07-16 DIAGNOSIS — M5459 Other low back pain: Secondary | ICD-10-CM

## 2022-07-16 DIAGNOSIS — G8929 Other chronic pain: Secondary | ICD-10-CM

## 2022-07-16 LAB — ANA REFLEX COMPREHENSIVE PANEL
Ana Interpretation Comment 1: NEGATIVE
Ana Screen By Multiplex: NEGATIVE
Antibodies to Nuclear Ags by IF (ANA): NEGATIVE
Hep2 Cytoplasmic Pattern: NEGATIVE

## 2022-07-16 LAB — PROTEIN ELECTR. REFLX PNL
Albumin: 4.6 g/dL (ref 3.5–4.9)
Alpha 1: 0.2 g/dL (ref 0.1–0.3)
Alpha 2: 0.8 g/dL (ref 0.3–0.8)
Beta: 0.7 g/dL (ref 0.6–1.0)
Electrophoresis Interp:: NORMAL
Gamma: 0.7 g/dL (ref 0.4–1.4)
Protein (Total): 6.9 g/dL (ref 6.0–8.2)

## 2022-07-16 LAB — IMMUNOFIXATION

## 2022-07-16 LAB — HEMOGLOBIN A1C, HPLC: Hemoglobin A1C: 5.2 % (ref 4.0–6.0)

## 2022-07-16 NOTE — Telephone Encounter (Signed)
Patient has an up coming MRI 08/22/21 and would like an anti-anxiety prescription for the MRI.

## 2022-07-16 NOTE — Progress Notes (Signed)
[place] [facility]    Acupuncture Treatment and Re-evaluatin:  Lower Back Pain   Referred by: Drexel Iha, MD     PCP: [name], MD    HPI: Ronnie Mason was referred to acupuncture and [place] medicine for consultation and treatment of    Chief Complaint   Patient presents with    Acupuncture     Chronic bilateral low back pain without sciatica, cervicalgia     Re-eval:  - Last visit (04/03/2022)  Ronnie Mason noted he has been experiencing elevated level of lower back pain lately. Today, pain level is 7-8/10. Pain does not wake him up in the night, walking and lifting things could easily aggravates his left lower back pain. Lately experiencing more radiating pain on his left side, feeling weakness on his left leg. Seating and laying on stomach would be the best position.     CC2: Chronic Back Pain   - Leflt lower back   - Pain scale today  7-8/10 at lower back  - Two days ago trigger point injection around QL at left.   - Radiates down to foot from the radial part of foot, and ball of the foot.   - Dose not wake up in the night.   - To manage the pain, Ronnie Mason takes Tylenol 1000mg  twice daily. Also taking Lyrica   - Experince weakness on his left leg especially in the morning  - Stretches helps   - Lifting and warlking aggravate the pain  - PT has been helpful to reduce the pain. Working on the strengthen the core.     Related issues   Neck and shoulder pain  - Left shoulder is less sore, and noted feeling better lately  - Since the last visit, no aggravation has experienced  - Today's pain scale: 2-3/10  at L, at this moment.   - Feeling mostly tight and sore  - Next week, EKG will be done for the shoulder to just investigate.    ROS  - Night time urination: twice per night  - Waking up refreshed most of the time  - Energy good  - Emotion fine.     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    Idiopathic peripheral neuropathy 03/15/2015    Spondylosis of cervical region without myelopathy or radiculopathy 05/28/2014    Back pain, lumbosacral 02/15/2013    B12 deficiency 12/21/2012    Borderline low 5 /21/2014    Chronic renal insufficiency, stage III (moderate) (HCC) 12/21/2012    5/ 2013    Hyperlipidemia 09/26/2012    Cervicalgia 03/11/2010    Radiofrequency ablation of c3 to c 7    Allergic rhinitis due to allergen     Carotid Sinus Hypersensitivity     Chronic pain     chronic LUE pain had injection.    Disc disorder of lumbosacral region     Hip injury     HYPERTENSION      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         Surgical History:  Past Surgical History:   Procedure Laterality Date    L meniciscus Left 07/2017    by Dr Marcelle OverlieHolland , re did left menisicus     PR ANES; COLONOSCOPY  2002    repeat in 3 years    PR ANES; COLONOSCOPY & POLYPECTOMY  11/18/2007    repeat in 3 years    PR CORRECTION HAMMERTOE Right 05/13/2022    Dr. Hilarie Fredricksonony Kim, DPM    PR HALLUX RIGIDUS W/CHEILECTOMY 1ST MP JT W/O IMPLT Right 10/21/2017    Dr. Donn PieriniBrian McInnes    PR UNLISTED PROCEDURE FEMUR/KNEE      PR UNLISTED PROCEDURE HANDS/FINGERS      PR UNLISTED PROCEDURE SPINE  2013    rfa to c3 to c 7     SURGICAL HX OTHER Right 12/03/2020    RIGHT foot WOUND INCISION AND DRAINAGE    TOE SURGERY Right 02/09/2019    toe surgery  Right 01/12/2019    Dr Hilarie Fredricksonony Kim         Medications:  Outpatient Medications Prior to Visit   Medication Sig Dispense Refill    Acetaminophen 500 MG Oral Tab Take 2 tablets (1,000 mg) by mouth 2 times a day.      Alpha-Lipoic Acid 300 MG tablet Take 1 tablet  by mouth every morning. Benfotiamine- Alpha Lipoic acid      amLODIPine 5 MG tablet Take 1 tablet (5 mg) by mouth 2 times a day. 180 tablet 3    atorvastatin 10 MG tablet Take 1 tablet (10 mg) by mouth daily. (Patient taking differently: Take 1 tablet (10 mg) by mouth every morning.) 90 tablet 3    BENFOTIAMINE OR Take 250 mg by mouth every morning.      betamethasone dipropionate 0.05 % lotion Apply topically 2 times a day. Apply to scalp and ears as needed for itching (Patient taking differently: Apply 1 Application  topically 2 times a day as needed. Apply to scalp and ears as needed for itching) 60 mL 11    Cholecalciferol (VITAMIN D3) 2000 units Oral Cap Take 1 capsule (2,000 Units) by mouth daily. For low level (Patient taking differently: Take 1 capsule (2,000 units) by mouth every morning. For low level) 1 capsule 1    clindamycin 1 % gel Apply 1 application  topically 2 times a day as needed. Apply to back.      CLINDAMYCIN HCL OR Take by mouth. CLINDAMYCIN CREAM (Patient not taking: Reported on 07/13/2022)      Cyanocobalamin 1000 MCG Oral Tab one per day over-the-counter started 5/21 /2012 this level borderline low (Patient taking differently: Take 1 tablet (1,000 mcg) by mouth every morning.) 1 Tab 1    diclofenac 1 % gel Apply 4 g topically 4 times a day. Apply to 2 gram four times daily to neck, trapezius muscle, and low back. Use a max of 32 grams a day. (Patient taking differently: Apply 4 g topically 4 times a day as needed. Apply to 2 gram four times daily to neck, trapezius muscle, and low back. Use a max of 32 grams a day.) 300 g 4    DULoxetine 60 MG DR capsule Take 1 capsule (60 mg) by mouth 2 times a day. 180 capsule 2    EPINEPHrine 0.3 MG/0.3ML auto-injector INJECT INTRAMUSCULARLY ONCE AS NEEDED FOR SEVERE ALLERGIC REACTION 4 each 3    ibuprofen 200 MG tablet Take 1 tablet (  200 mg) by mouth daily as needed (pain).      lidocaine 5 % patch Apply 1 patch onto the skin daily. Apply to painful  area for up to 12 hours in a 24 hour period. (Patient taking differently: Apply 1 patch onto the skin daily as needed. Apply to painful area for up to 12 hours in a 24 hour period.) 30 patch 2    lisinopril 20 MG tablet Take 1 tablet (20 mg) by mouth 2 times a day. 180 tablet 3    naltrexone 50 MG tablet TAKE 1 TABLET BY MOUTH DAILY WITH FOOD 30 tablet 0    Omega-3 Fatty Acids (OMEGA 3 OR) Take 1 tablet by mouth every morning.      pregabalin 300 MG capsule Take 1 capsule (300 mg) by mouth 2 times a day. 180 capsule 1    primidone 50 MG tablet Take 1 tablet (50 mg) by mouth 2 times a day. 180 tablet 4     No facility-administered medications prior to visit.        Allergies:  Review of patient's allergies indicates:  Allergies   Allergen Reactions    Adhesives Skin: Rash     Glue on lido patches    Bee Venom Skin: Hives, Skin: Itching and Swelling    Gabapentin Other     Flu like symptoms, diarrhea, body ache upset stomach    Methocarbamol Other     Patient's wife reports cognitive difficulties ("goofy")        Family/Social history:  family history includes Colon Cancer in his father; Heart (other) in his mother; Other Family Hx in an other family member. There is no history of Cancer.       OBJECTIVE:  Vitals:  There were no vitals taken for this visit.  Pulse: Slightly slippery.  Tongue: center crack with black coating in the middle. Red small tongue body.  Lips are dark purple color      Observation: Left mid back - marks from the trigger point injection (approximately 7 location concentrating QL area).     Assessment/Plan:    Following an evaluation including the history of present illness/s, medical history and examination, the patient's clinical presentation suggest probable:    (M54.50,  G89.29) Chronic bilateral low back pain, unspecified whether sciatica present  (primary encounter diagnosis)      Diagnosis According to [place] Medicine:  Qi stagnation and Blood stasis in [facility]     Treatment  Principle/Goals:   Short-term: reduce muscle hypertonicity, decrease discomfort/pain, improve circulation and increase range of motion  Long-term: avoid the development of chronic pain syndrome and minimize probability of future exacerbations.      Acupuncture Treatment.     Ronnie Mason noted the most comforatble position is seating or face down.   First 15 Minutes: Seating position (treatment table with back up position)    (BL)   R: YNSA D1, D2, Zong bai, Jess Barters, Shou Wu Stephan, Men Kingsley,   L: YNSA Cerebrum, [address] (3), GB31,     [place] in: 12  Needles in: 12    Second 8 Minutes: Seating position  L: Lumbar, Shoulder, Ronnie Mason, [place] D1  R: Ronnie Mason     Needles in: 5  Needles out: 5    Pyonex (0.60mm) were placed at GB31 bilaterally, and Ronnie Mason and SJ15 at left. I advised to keep them upto 5 days and take them off. However, if there is any irritability or  discomfort felt, then remove them sooner. I also advised to discard the used one safely.      Post Treatment Note:   Ronnie Mason tolerated the all the treatment well without any complications.      Time Spent:   - Time spent for face-to-face for evaluation and management: 15 minutes.   - Time spent for face-to-face for acupuncture: 20 minutes.  Total time spent for patient's care, including chart review, relevant lab/imaging review, and creation of care plan: 40 minutes.    Ronnie Mason has shown improvement of the chief complaints and I believe that continuous acupuncture treatments would be beneficial for [name] & suggest another 4 sessions of treatment, and reassess at 4th visit or in 2 months.       Ronnie Mason, AEMP, DACM, LAc  Licensed Acupuncturist, Paramedic for [employer]  Department of Family medicine  [place]

## 2022-07-17 ENCOUNTER — Encounter (HOSPITAL_BASED_OUTPATIENT_CLINIC_OR_DEPARTMENT_OTHER): Payer: Self-pay | Admitting: Urology

## 2022-07-17 ENCOUNTER — Ambulatory Visit: Payer: Medicare PPO | Attending: Urology | Admitting: Urology

## 2022-07-17 VITALS — BP 107/71 | HR 89

## 2022-07-17 DIAGNOSIS — N401 Enlarged prostate with lower urinary tract symptoms: Secondary | ICD-10-CM | POA: Insufficient documentation

## 2022-07-17 DIAGNOSIS — N3941 Urge incontinence: Secondary | ICD-10-CM | POA: Insufficient documentation

## 2022-07-17 DIAGNOSIS — N3946 Mixed incontinence: Secondary | ICD-10-CM | POA: Insufficient documentation

## 2022-07-17 DIAGNOSIS — N138 Other obstructive and reflux uropathy: Secondary | ICD-10-CM | POA: Insufficient documentation

## 2022-07-17 DIAGNOSIS — N319 Neuromuscular dysfunction of bladder, unspecified: Secondary | ICD-10-CM | POA: Insufficient documentation

## 2022-07-17 DIAGNOSIS — R351 Nocturia: Secondary | ICD-10-CM | POA: Insufficient documentation

## 2022-07-17 LAB — U/A NONAUTO W/MICRO, ONSITE
Glucose, Urine: NEGATIVE mg/dL
Ketones, URN: 5 mg/dL
Leukocytes: NEGATIVE
Nitrite, URN: NEGATIVE
Occult Blood, URN: NEGATIVE
RBC, URN: 0 /HPF (ref ?–2)
Urobilinogen, URN: 0.2 E.U./dL (ref 0.2–1.0)
WBC, URN: 0 /HPF (ref ?–5)
pH, URN (UWNC): 5 (ref 5.0–8.0)

## 2022-07-17 LAB — UROFLOWMETRY: Urine Volume: 0

## 2022-07-17 MED ORDER — DIAZEPAM 2 MG OR TABS
2.0000 mg | ORAL_TABLET | Freq: Every day | ORAL | 0 refills | Status: DC | PRN
Start: 2022-07-17 — End: 2022-10-01

## 2022-07-17 MED ORDER — MIRABEGRON ER 25 MG OR TB24
25.0000 mg | EXTENDED_RELEASE_TABLET | Freq: Every day | ORAL | 12 refills | Status: DC
Start: 2022-07-17 — End: 2022-08-28

## 2022-07-17 NOTE — Patient Instructions (Addendum)
1.  Urinalysis does not show evidence of bladder infection    2.  Bladder empties well    3.  Recommend a trial of mirabegron for leaking urine with urgency    4.  Prescription sent to her pharmacy    5.  Follow-up in about 6 weeks for reevaluation of urine leaking on mirabegron

## 2022-07-17 NOTE — Telephone Encounter (Signed)
Pt has MRI scheduled for 08/22/2022 and would like some antianxiety medication prescribed.  Order for MRI done by DR. Fransisca Connors.  Will request from team.

## 2022-07-17 NOTE — Telephone Encounter (Signed)
Called pt to let him know that the prescription was sent to the pharmacy.  They had no further questions.

## 2022-07-17 NOTE — Progress Notes (Signed)
07/17/2022    Post Void Residual    Patient was asked to empty their bladder prior to PVR testing.    This was completed through portable ultrasound with the bladder scanner.    Patient's PVR is 0 mL's.      Paxtyn Boyar, CMA

## 2022-07-17 NOTE — Telephone Encounter (Signed)
Placed prescription for patient to pharmacy on file with instructions for use. Patient will need driver when using this medication.

## 2022-07-17 NOTE — Progress Notes (Signed)
Patient: Ronnie Mason  DOB: 09/05/1950 (71 year old)  Date of Visit: 07/17/2022    CC:  Incontinence    SUBJECTIVE:  71 year old male with traumatic brain injury, hydrocephalus, hypertension, chronic renal insufficiency, sent for evaluation of incontinence    Review of records:  -Review of med list shows no specific bladder medications  - Labs 24 Mar 2022: BUN 22, creatinine 0.96    Patient is here with his wife, Ronnie Mason who provides for some of his history and is present for the interview  - Symptoms have been present for about 3 to 4 years, somewhat worsening recently  - Large episode of urge incontinence up to 3 times weekly  - Especially noticeable when standing up from sitting for a long time  - Minimal daytime frequency  - Has significant enuresis  - Also notes nocturia x 2  - Minimal obstructive symptoms    - In 2020, tried Solifenacin with Reggie Pile, ARNP at [facility]  - Does not recall it being particularly helpful, also was concerned about mental health status changes with the medication    -No family history of prostate cancer  - Reports PSAs were checked by primary care physician in the past but have not been recently          I reviewed the patient's recorded medical history, and confirmed the medications, problem list, allergies, past medical history, past surgical history, social history, and family history with the patient.       OBJECTIVE:  Vitals: BP 107/71   Pulse 89   SpO2 95%   [place]:   Declined  General: healthy, alert, no distress  DRE:  Anal tone normal. Masses were not appreciated. There was no blood. There were no hemorrhoids.    Prostate Exam:  Prostate smooth, moderately enlarged, and with normal consistency. There was no nodularity. Non-tender.    IPSS 17 July 2022: Patient scored 11 indicating moderate BPH symptoms.  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

## 2022-07-20 NOTE — Addendum Note (Signed)
Addended by: Tommi Rumps on: 07/20/2022 06:24 PM     Modules accepted: Orders

## 2022-07-21 ENCOUNTER — Encounter (INDEPENDENT_AMBULATORY_CARE_PROVIDER_SITE_OTHER): Payer: Medicare PPO | Admitting: Physical Medicine & Rehabilitation

## 2022-07-29 NOTE — Progress Notes (Deleted)
PROCEDURE NOTE  TRIGGER POINT INJECTIONS UNDER ULTRASOUND GUIDANCE, #3 of 3  08/04/2022    PRE-PROCEDURE DIAGNOSIS  (M79.18) Myofascial pain  (primary encounter diagnosis)  (M54.50,  G89.29) Chronic bilateral low back pain without sciatica  (G89.4) Chronic pain syndrome  (M47.816) Lumbar facet arthropathy.    POST-PROCEDURE DIAGNOSIS  (M79.18) Myofascial pain  (primary encounter diagnosis)  (M54.50,  G89.29) Chronic bilateral low back pain without sciatica  (G89.4) Chronic pain syndrome  (M47.816) Lumbar facet arthropathy.    INDICATION FOR INJECTION  Chronic low back pain with myofascial components.  I described the disorder in terms that were understandable to the patient. I reviewed the risks, benefits, and alternative techniques for treatment as well as the option of no treatment. Patient was encouraged to ask questions and expressed understanding and acceptance.    PERFORMING PROVIDER: Jonni Sanger, PA-C    SEDATION: NO    NPO STATUS:  NO    Guidance:  Ultrasound guidance used for this procedure, appropriate images viewed and saved to the medical record.    DESCRIPTION OF THE PROCEDURE  Position: prone***  Monitoring: standard    Preparation: The injection area was prepped with Chloraprep.  Imaging for needle placement: Linear transducer placed over the painful areas.  Needle used: 25g, 1.5 inch.  Needle insertion: Out-of plane.  Imaging conditions: Good visualization of the muscle planes.    Ultrasound exam identifies the target muscles and fascia layers to be injected, with special attention paid to nearby neurovascular structures.    A twitch response was elicited at each site prior to injection. After negative aspiration, a solution containing ***11 mL of 0.25% Bupivacaine without epinephrine was distributed evenly between ***10 targets under live ultrasound visualization in the following muscle regions:   ***  Left latissimus dorsi  Left lumbar erector spinae  Left serratus posterior inferior  Left internal  oblique  Left external oblique    Uneventful removal of the needle. Ultrasound images during the guided injections were saved.    POST-PROCEDURE:  He tolerated the procedure well and was monitored to be in stable condition where he made an uneventful recovery.    Mr. Remo was reassessed after the procedure.  The pain score was 7/10 before the procedure and 5-6/10 prior to discharge (0 = no pain, 10 = worst pain imaginable).  Mr. Stidd did note initial improvement in pain, including pain with movement.     COMPLICATIONS:  none     ASSESSMENT/PLAN:  Mr. Rosenboom was instructed to follow up in 2-3 weeks for the next scheduled procedure.    He reports that he received good relief over a few days after his last TPIs. He also reports that he is undergoing an EMG with his neurologist next week.      Jonni Sanger, PA-C  Physician Assistant - Faculty Teaching Associate  [facility]  Department of Anesthesiology & Pain Medicine

## 2022-07-31 ENCOUNTER — Ambulatory Visit (INDEPENDENT_AMBULATORY_CARE_PROVIDER_SITE_OTHER): Payer: Medicare PPO | Admitting: Acupuncturist

## 2022-07-31 DIAGNOSIS — M5459 Other low back pain: Secondary | ICD-10-CM

## 2022-07-31 DIAGNOSIS — G8929 Other chronic pain: Secondary | ICD-10-CM

## 2022-07-31 DIAGNOSIS — M545 Low back pain, unspecified: Secondary | ICD-10-CM

## 2022-07-31 NOTE — Progress Notes (Signed)
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   - Radiates down to foot from the radial part of foot, and ball of the foot.   - Dose not wake up in the night.   - To manage the pain, Ronnie Mason takes Tylenol 1000mg  twice daily. Also taking Lyrica   - Experince weakness on his left leg especially in the morning  - Stretches helps   - Lifting and warlking aggravate the pain  - Today Little noted not able to lay down neither face down nor up due to pain due to pain    Related issues   Neck and shoulder pain  - Left shoulder is feeling very sore today  - Since the last visit, no aggravation has experienced  - Today's pain scale: 8-9/10  at L, at this moment.   - Feeling mostly tight and sore  - Next week, EKG will be done for the shoulder to just investigate.    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    Idiopathic peripheral neuropathy 03/15/2015    Spondylosis of cervical region without myelopathy or radiculopathy 05/28/2014    Back pain, lumbosacral 02/15/2013    B12 deficiency 12/21/2012    Borderline low 5 /21/2014    Chronic renal insufficiency, stage III (moderate) (HCC) 12/21/2012    5/ 2013    Hyperlipidemia 09/26/2012    Cervicalgia 03/11/2010    Radiofrequency ablation of c3 to c 7    Allergic rhinitis due to allergen     Carotid Sinus Hypersensitivity     Chronic pain     chronic LUE pain had injection.    Disc disorder of lumbosacral region     Hip injury     HYPERTENSION      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[facility])         Surgical History:  Past Surgical History:   Procedure Laterality Date    L meniciscus Left 07/2017    by Dr 08/2017 , re did left menisicus     PR ANES; COLONOSCOPY  2002    repeat in 3 years    PR ANES; COLONOSCOPY & POLYPECTOMY  11/18/2007    repeat in 3 years    PR CORRECTION HAMMERTOE Right 05/13/2022    Dr. Hilarie Fredrickson, DPM    PR HALLUX RIGIDUS W/CHEILECTOMY 1ST MP JT W/O IMPLT Right 10/21/2017    Dr. Donn Pierini    PR UNLISTED PROCEDURE FEMUR/KNEE      PR UNLISTED PROCEDURE HANDS/FINGERS      PR UNLISTED PROCEDURE SPINE  2013    rfa to c3 to c 7     SURGICAL HX OTHER Right 12/03/2020    RIGHT foot WOUND INCISION AND DRAINAGE    TOE SURGERY Right 02/09/2019    toe surgery  Right 01/12/2019    Dr Hilarie Fredrickson         Medications:  Outpatient Medications Prior to Visit   Medication Sig Dispense Refill    Acetaminophen 500 MG Oral Tab Take 2 tablets (1,000 mg) by mouth 2 times a day.      Alpha-Lipoic Acid 300 MG tablet Take 1 tablet by mouth every morning. Benfotiamine- Alpha Lipoic acid      amLODIPine 5 MG tablet Take 1 tablet (5 mg) by mouth 2 times a day. 180 tablet 3    atorvastatin 10 MG tablet Take 1 tablet (10 mg) by mouth daily. (Patient taking differently: Take 1 tablet (10 mg) by mouth every morning.) 90 tablet 3    BENFOTIAMINE OR Take 250 mg by mouth every  morning.      betamethasone dipropionate 0.05 % lotion Apply topically 2 times a day. Apply to scalp and ears as needed for itching (Patient taking differently: Apply 1 Application  topically 2 times a day as needed. Apply to scalp and ears as needed for itching) 60 mL 11    Cholecalciferol (VITAMIN D3) 2000 units Oral Cap Take 1 capsule (2,000 Units) by mouth daily. For low level (Patient taking differently: Take 1 capsule (2,000 units) by mouth every morning. For low level) 1 capsule 1    clindamycin 1 % gel Apply 1 application  topically 2 times a day as needed. Apply to back.      CLINDAMYCIN HCL OR Take by mouth. CLINDAMYCIN CREAM (Patient not taking: Reported on 07/17/2022)      Cyanocobalamin 1000 MCG Oral Tab one per day over-the-counter started 5/21 /2012 this level borderline low (Patient taking differently: Take 1 tablet (1,000 mcg) by mouth every morning.) 1 Tab 1    diazePAM 2 MG tablet Take 1 tablet (2 mg) by mouth daily as needed for anxiety. Take tab 30 minutes before check in. Repeat dose if needed at check in 2 tablet 0    diclofenac 1 % gel Apply 4 g topically 4 times a day. Apply to 2 gram four times daily to neck, trapezius muscle, and low back. Use a max of 32 grams a day. (Patient taking differently: Apply 4 g topically 4 times a day as needed. Apply to 2 gram four times daily to neck, trapezius muscle, and low back. Use a max of 32 grams a day.) 300 g 4    DULoxetine 60 MG DR capsule Take 1 capsule (60 mg) by mouth 2 times a day. 180 capsule 2    EPINEPHrine 0.3 MG/0.3ML auto-injector INJECT INTRAMUSCULARLY ONCE AS NEEDED FOR SEVERE ALLERGIC REACTION 4 each 3    ibuprofen 200 MG tablet Take 1 tablet (200 mg) by mouth daily as needed (pain).      lidocaine 5 % patch Apply 1 patch onto the skin daily. Apply to painful  area for up to 12 hours in a 24 hour period. (Patient taking differently: Apply 1 patch onto the skin daily as needed. Apply to painful area for up to 12 hours in a 24 hour  period.) 30 patch 2    lisinopril 20 MG tablet Take 1 tablet (20 mg) by mouth 2 times a day. 180 tablet 3    mirabegron ER 25 MG 24 hr tablet Take 1 tablet (25 mg) by mouth daily. 30 tablet 12    naltrexone 50 MG tablet TAKE 1 TABLET BY MOUTH DAILY WITH FOOD 30 tablet 0    Omega-3 Fatty Acids (OMEGA 3 OR) Take 1 tablet by mouth every morning.      pregabalin 300 MG capsule Take 1 capsule (300 mg) by mouth 2 times a day. 180 capsule 1    primidone 50 MG tablet Take 1 tablet (50 mg) by mouth 2 times a day. 180 tablet 4     No facility-administered medications prior to visit.        Allergies:  Review of patient's allergies indicates:  Allergies   Allergen Reactions    Adhesives Skin: Rash     Glue on lido patches    Bee Venom Skin: Hives, Skin: Itching and Swelling    Gabapentin Other     Flu like symptoms, diarrhea, body ache upset stomach    Methocarbamol Other     Patient's wife reports cognitive difficulties ("goofy")        Family/Social history:  family history includes Colon Cancer in his father; Heart (other) in his mother; Other Family Hx in an other family member. There is no history of Cancer.       OBJECTIVE:  Vitals:  There were no vitals taken for this visit.  Pulse: Slightly slippery.  Tongue: Differed    Observation: Tremors on his hands and legs - noting he has taken medication for tremors.     Assessment/Plan:    Following an evaluation including the history of present illness/s, medical history and examination, the patient's clinical presentation suggest probable:    (M54.50,  G89.29) Chronic bilateral low back pain without sciatica  (primary encounter diagnosis)        Diagnosis According to [place] [facility]:  Qi stagnation and Blood stasis in UB Channel     Treatment Principle/Goals:   Short-term: reduce muscle hypertonicity, decrease discomfort/pain, improve circulation and increase range of motion  Long-term: avoid the development of chronic pain syndrome and minimize probability of future  exacerbations.      Acupuncture Treatment.   First 15 Minutes: Seating position on a chair with 3 pillows on his lap  L: [place], Shoulder, YNSA D1, YNSA C  R: Tanna Savoy applied to neck and left shoulder.     Needles in: 6  Needles in: 6    Second 8 Minutes: Seating position on a chair with 3 pillows on his lap  R: HT7, SI3  L: Luo Zhen    Needles in: 3  Needles out: 3    Pyonex (0.47mm) were placed at [place], GB15, R - Zheng Jin, Zheng Zhong, ashi LI5, SI5 I advised to keep them upto 5 days and take them off. However, if there is any irritability or discomfort felt, then remove them sooner. I also advised to discard the used one safely.      Post Treatment Note:   Ronnie Mason tolerated the all the treatment well without any complications.  Time Spent:   - Time spent for face-to-face for acupuncture: 23 minutes.    Ronnie Mason, [place], [place], [place]  Licensed Acupuncturist, Scientist, physiological for [place] [facility] [place] [facility] [place]

## 2022-08-04 ENCOUNTER — Encounter (HOSPITAL_BASED_OUTPATIENT_CLINIC_OR_DEPARTMENT_OTHER): Payer: Medicare PPO | Admitting: Medical

## 2022-08-04 ENCOUNTER — Ambulatory Visit (HOSPITAL_BASED_OUTPATIENT_CLINIC_OR_DEPARTMENT_OTHER): Payer: Medicare PPO | Admitting: Student in an Organized Health Care Education/Training Program

## 2022-08-04 DIAGNOSIS — M7918 Myalgia, other site: Secondary | ICD-10-CM

## 2022-08-04 DIAGNOSIS — G894 Chronic pain syndrome: Secondary | ICD-10-CM

## 2022-08-04 DIAGNOSIS — G8929 Other chronic pain: Secondary | ICD-10-CM

## 2022-08-04 DIAGNOSIS — M47816 Spondylosis without myelopathy or radiculopathy, lumbar region: Secondary | ICD-10-CM

## 2022-08-05 ENCOUNTER — Inpatient Hospital Stay (INDEPENDENT_AMBULATORY_CARE_PROVIDER_SITE_OTHER): Admit: 2022-08-05 | Discharge: 2022-08-05 | Disposition: A | Discharge status: Home / Self Care | Payer: Medicare PPO

## 2022-08-05 ENCOUNTER — Ambulatory Visit (INDEPENDENT_AMBULATORY_CARE_PROVIDER_SITE_OTHER): Payer: Medicare PPO | Admitting: Podiatrist

## 2022-08-05 VITALS — BP 139/65 | HR 93 | Temp 98.0°F

## 2022-08-05 DIAGNOSIS — M205X1 Other deformities of toe(s) (acquired), right foot: Secondary | ICD-10-CM

## 2022-08-05 DIAGNOSIS — Z9889 Other specified postprocedural states: Secondary | ICD-10-CM

## 2022-08-05 NOTE — Progress Notes (Signed)
Procedure:  CORRECTION, RIGHT MALLET TOE, 2nd TOE, RIGHT  CORRECTION, RIGHT MALLET TOE, 3rd TOE, RIGHT   POD:  12 weeks.  Date of surgery:  05/13/22    Ronnie Mason is a 72 year old year old male return today for routine post operative check.    Patient relates current issues: no new problems/complaints.     The patient states please with the progress.    The patient states of 3/10 and 4/10 pain.    The patient is compliant with post-operative instructions.    Current instructed post-op weightbearing status is full weightbearing.    The patient denies  none .    Patient denies of side effects of the prescribed medication.    Current post-op pain medication is Tylenol.    The pain is well controlled.    Current therapy:  no physical therapy at this point.    DVT prophylaxis:  none - patient is ambulating.

## 2022-08-05 NOTE — Progress Notes (Signed)
Post-Surgery Follow-Up SOAP Note    Patient: Ronnie Mason   Patient DOB: 1950-10-29     DOS:  08/05/2022     Accompanied by:  patient was unaccompanied    Subjective:  (history is obtained by MA/RN and edited by [name])  Procedure:  CORRECTION, RIGHT MALLET TOE, 2nd TOE, CORRECTION, MALLET TOE, 3rd TOE, RIGHT   POD:  12 weeks.  Date of surgery:  05/13/22     Ronnie Mason is a 72 year old year old male return today for routine post operative check.    Patient relates current issues: no new problems/complaints.     The patient states please with the progress.    The patient states of 3/10 and 4/10 pain.    The patient is compliant with post-operative instructions.    Current instructed post-op weightbearing status is full weightbearing.    The patient denies  none .    Patient denies of side effects of the prescribed medication.    Current post-op pain medication is Tylenol.    The pain is well controlled.    Current therapy:  no physical therapy at this point.    DVT prophylaxis:  none - patient is ambulating.      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    Idiopathic peripheral neuropathy 03/15/2015    Spondylosis of cervical region without myelopathy or radiculopathy 05/28/2014    Back pain, lumbosacral 02/15/2013    B12 deficiency 12/21/2012    Borderline low 5 /21/2014    Chronic renal insufficiency, stage III (moderate) (HCC) 12/21/2012    5/ 2013    Hyperlipidemia 09/26/2012    Cervicalgia 03/11/2010    Radiofrequency ablation of c3 to c 7    Allergic rhinitis due to allergen     Carotid  Sinus Hypersensitivity     Chronic pain     chronic LUE pain had injection.    Disc disorder of lumbosacral region     Hip injury     HYPERTENSION      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[facility])         Review of patient's allergies indicates:  Allergies   Allergen Reactions    Adhesives Skin: Rash     Glue on lido patches    Bee Venom Skin: Hives, Skin: Itching and Swelling    Gabapentin Other     Flu like symptoms, diarrhea, body ache upset stomach    Methocarbamol Other     Patient's wife reports cognitive difficulties ("goofy")        Medications:   Outpatient Medications Prior to Visit   Medication Sig Dispense Refill    Acetaminophen 500 MG Oral Tab Take 2 tablets (1,000 mg) by mouth 2 times a day.      Alpha-Lipoic Acid 300 MG tablet Take 1 tablet by mouth every morning. Benfotiamine- Alpha Lipoic acid      amLODIPine 5 MG tablet Take 1 tablet (5 mg) by mouth 2 times a day. 180 tablet 3    atorvastatin 10  MG tablet Take 1 tablet (10 mg) by mouth daily. (Patient taking differently: Take 1 tablet (10 mg) by mouth every morning.) 90 tablet 3    BENFOTIAMINE OR Take 250 mg by mouth every morning.      betamethasone dipropionate 0.05 % lotion Apply topically 2 times a day. Apply to scalp and ears as needed for itching (Patient taking differently: Apply 1 Application  topically 2 times a day as needed. Apply to scalp and ears as needed for itching) 60 mL 11    Cholecalciferol (VITAMIN D3) 2000 units Oral Cap Take 1 capsule (2,000 Units) by mouth daily. For low level (Patient taking differently: Take 1 capsule (2,000 units) by mouth every morning. For low level) 1 capsule 1    clindamycin 1 % gel Apply 1 application  topically 2 times a day as needed. Apply to back.      CLINDAMYCIN HCL OR Take by mouth. CLINDAMYCIN CREAM (Patient not taking: Reported on 07/17/2022)      Cyanocobalamin 1000 MCG Oral Tab one per day over-the-counter started 5/21 /2012 this level borderline low (Patient taking differently: Take 1 tablet (1,000 mcg)  by mouth every morning.) 1 Tab 1    diazePAM 2 MG tablet Take 1 tablet (2 mg) by mouth daily as needed for anxiety. Take tab 30 minutes before check in. Repeat dose if needed at check in 2 tablet 0    diclofenac 1 % gel Apply 4 g topically 4 times a day. Apply to 2 gram four times daily to neck, trapezius muscle, and low back. Use a max of 32 grams a day. (Patient taking differently: Apply 4 g topically 4 times a day as needed. Apply to 2 gram four times daily to neck, trapezius muscle, and low back. Use a max of 32 grams a day.) 300 g 4    DULoxetine 60 MG DR capsule Take 1 capsule (60 mg) by mouth 2 times a day. 180 capsule 2    EPINEPHrine 0.3 MG/0.3ML auto-injector INJECT INTRAMUSCULARLY ONCE AS NEEDED FOR SEVERE ALLERGIC REACTION 4 each 3    ibuprofen 200 MG tablet Take 1 tablet (200 mg) by mouth daily as needed (pain).      lidocaine 5 % patch Apply 1 patch onto the skin daily. Apply to painful area for up to 12 hours in a 24 hour period. (Patient taking differently: Apply 1 patch onto the skin daily as needed. Apply to painful area for up to 12 hours in a 24 hour period.) 30 patch 2    lisinopril 20 MG tablet Take 1 tablet (20 mg) by mouth 2 times a day. 180 tablet 3    mirabegron ER 25 MG 24 hr tablet Take 1 tablet (25 mg) by mouth daily. 30 tablet 12    naltrexone 50 MG tablet TAKE 1 TABLET BY MOUTH DAILY WITH FOOD 30 tablet 0    Omega-3 Fatty Acids (OMEGA 3 OR) Take 1 tablet by mouth every morning.      pregabalin 300 MG capsule Take 1 capsule (300 mg) by mouth 2 times a day. 180 capsule 1    primidone 50 MG tablet Take 1 tablet (50 mg) by mouth 2 times a day. 180 tablet 4     No facility-administered medications prior to visit.        Objective:   Vitals:    08/05/22 1414   Temp: 36.7 C   Pulse: 93   BP: 139/65   SpO2: 95%  The surgical sites were examined and the incision(s) has/have healed with minimal scar.  There is no pain on surgical site.  There is minimal swelling noted.  Clinically, the  post-operative alignments is good.   There is no signs of DVT i.e. no calf pain, swelling and redness.         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 The patient appears to be compliant  regarding post-operative instruction.    Plan:    A discussion took place regarding post-operative progress.  Patient was instructed to call our office if they notice any significant change is post-operative progress.  Precaution were stressed today regards to level of activity based on discomfort.    We have reviewed today's x-ray finding and detail discussion took place with the patient.       Ambulatory status:  The patient was instructed to gradually increase the activity (walk/jog/run program) using stable shoes.        Physical therapy:  none.       Return/Follow-up:  PRN or symptom worsen    Vira Browns DPM, FACFAS  Reconstructive Ankle and Foot Surgeon  [facility]

## 2022-08-07 ENCOUNTER — Ambulatory Visit (INDEPENDENT_AMBULATORY_CARE_PROVIDER_SITE_OTHER): Payer: Medicare PPO | Admitting: Acupuncturist

## 2022-08-07 DIAGNOSIS — G8929 Other chronic pain: Secondary | ICD-10-CM

## 2022-08-07 DIAGNOSIS — M5459 Other low back pain: Secondary | ICD-10-CM

## 2022-08-07 NOTE — Progress Notes (Signed)
[facility]    Acupuncture Treatment: Lower Back Pain   Referred by: Drexel Iha, MD     PCP: [name], MD    HPI: Zachari Alberta was referred to acupuncture and [place] medicine for consultation and treatment of    Chief Complaint   Patient presents with    Acupuncture      Chronic Back Pain     Since the last visit, Aswad noted that decrease of the lower back pain. Today, pain is felt 4/10 and noted feeling better.     CC2: Chronic Back Pain   - location: Today, feeling more at the lower back. Knots are now felt more lateral side of the lowe back     - Pain scale today  4/10 at lower back. Today, hasn't done much and pain level,   - During past 6/10 was highest due working   - Dose not wake up in the night.   - To manage the pain, Samnang takes Tylenol 1000mg  twice daily. Also taking Lyrica   - Experince weakness on his left leg especially in the morning  - Stretches helps, acupuncture helps    Related issues   Neck and shoulder pain  - Left shoulder is feeling less sore today  - Since the last visit, no aggravation has experienced  - Today's pain scale: 2/10  - Feeling mostly tight and sore    ROS  - Daivd had a post-operative visit with Dr. 4/10 yesterday and noting it went well. He was cleared from exercise.   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    Idiopathic peripheral neuropathy 03/15/2015    Spondylosis of cervical region  without myelopathy or radiculopathy 05/28/2014    Back pain, lumbosacral 02/15/2013    B12 deficiency 12/21/2012    Borderline low 5 /21/2014    Chronic renal insufficiency, stage III (moderate) (HCC) 12/21/2012    5/ 2013    Hyperlipidemia 09/26/2012    Cervicalgia 03/11/2010    Radiofrequency ablation of c3 to c 7    Allergic rhinitis due to allergen     Carotid Sinus Hypersensitivity     Chronic pain     chronic LUE pain had injection.    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 07/13/2022, DPM  PR HALLUX RIGIDUS W/CHEILECTOMY 1ST MP JT W/O IMPLT Right 10/21/2017    Dr. Donn Pierini    PR UNLISTED PROCEDURE FEMUR/KNEE      PR UNLISTED PROCEDURE HANDS/FINGERS      PR UNLISTED PROCEDURE SPINE  2013    rfa to c3 to c 7     SURGICAL HX OTHER Right 12/03/2020    RIGHT foot WOUND INCISION AND DRAINAGE    TOE SURGERY Right 02/09/2019    toe surgery  Right 01/12/2019    Dr Hilarie Fredrickson         Medications:  Outpatient Medications Prior to Visit   Medication Sig Dispense Refill    Acetaminophen 500 MG Oral Tab Take 2 tablets (1,000 mg) by mouth 2 times a day.      Alpha-Lipoic Acid 300 MG tablet Take 1 tablet by mouth every morning. Benfotiamine- Alpha Lipoic acid      amLODIPine 5 MG tablet Take 1 tablet (5 mg) by mouth 2 times a day. 180 tablet 3    atorvastatin 10 MG tablet Take 1 tablet (10 mg) by mouth daily. (Patient taking differently: Take 1 tablet (10 mg) by mouth every morning.) 90 tablet 3    BENFOTIAMINE OR Take 250 mg by mouth every morning.      betamethasone dipropionate 0.05 % lotion Apply topically 2 times a day. Apply to scalp and  ears as needed for itching (Patient taking differently: Apply 1 Application  topically 2 times a day as needed. Apply to scalp and ears as needed for itching) 60 mL 11    Cholecalciferol (VITAMIN D3) 2000 units Oral Cap Take 1 capsule (2,000 Units) by mouth daily. For low level (Patient taking differently: Take 1 capsule (2,000 units) by mouth every morning. For low level) 1 capsule 1    clindamycin 1 % gel Apply 1 application  topically 2 times a day as needed. Apply to back.      CLINDAMYCIN HCL OR Take by mouth. CLINDAMYCIN CREAM (Patient not taking: Reported on 07/17/2022)      Cyanocobalamin 1000 MCG Oral Tab one per day over-the-counter started 5/21 /2012 this level borderline low (Patient taking differently: Take 1 tablet (1,000 mcg) by mouth every morning.) 1 Tab 1    diazePAM 2 MG tablet Take 1 tablet (2 mg) by mouth daily as needed for anxiety. Take tab 30 minutes before check in. Repeat dose if needed at check in 2 tablet 0    diclofenac 1 % gel Apply 4 g topically 4 times a day. Apply to 2 gram four times daily to neck, trapezius muscle, and low back. Use a max of 32 grams a day. (Patient taking differently: Apply 4 g topically 4 times a day as needed. Apply to 2 gram four times daily to neck, trapezius muscle, and low back. Use a max of 32 grams a day.) 300 g 4    DULoxetine 60 MG DR capsule Take 1 capsule (60 mg) by mouth 2 times a day. 180 capsule 2    EPINEPHrine 0.3 MG/0.3ML auto-injector INJECT INTRAMUSCULARLY ONCE AS NEEDED FOR SEVERE ALLERGIC REACTION 4 each 3    ibuprofen 200 MG tablet Take 1 tablet (200 mg) by mouth daily as needed (pain).      lidocaine 5 % patch Apply 1 patch onto the skin daily. Apply to painful area for up to 12 hours in a 24 hour period. (Patient taking differently: Apply 1 patch onto the skin daily as needed. Apply to painful  area for up to 12 hours in a 24 hour period.) 30 patch 2    lisinopril 20 MG tablet Take 1 tablet (20 mg) by mouth 2 times a day. 180 tablet 3     mirabegron ER 25 MG 24 hr tablet Take 1 tablet (25 mg) by mouth daily. 30 tablet 12    naltrexone 50 MG tablet TAKE 1 TABLET BY MOUTH DAILY WITH FOOD 30 tablet 0    Omega-3 Fatty Acids (OMEGA 3 OR) Take 1 tablet by mouth every morning.      pregabalin 300 MG capsule Take 1 capsule (300 mg) by mouth 2 times a day. 180 capsule 1    primidone 50 MG tablet Take 1 tablet (50 mg) by mouth 2 times a day. 180 tablet 4     No facility-administered medications prior to visit.        Allergies:  Review of patient's allergies indicates:  Allergies   Allergen Reactions    Adhesives Skin: Rash     Glue on lido patches    Bee Venom Skin: Hives, Skin: Itching and Swelling    Gabapentin Other     Flu like symptoms, diarrhea, body ache upset stomach    Methocarbamol Other     Patient's wife reports cognitive difficulties ("goofy")        Family/Social history:  family history includes Colon Cancer in his father; Heart (other) in his mother; Other Family Hx in an other family member. There is no history of Cancer.       OBJECTIVE:  Vitals:  There were no vitals taken for this visit.  Pulse: Slightly slippery.  Tongue: Differed    Observation: Tremors on his hands and legs - noting he has taken medication for tremors.     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      Acupuncture Treatment.   First 15 Minutes: Seating position on a the  treatment table with 3 pillows on his lap    R: YNSA D1, D2, Shoulder Triangle (3), GB31,   L: Luvenia Heller, Lign Gu Da Donley Redder applied to neck and left shoulder.     Needles in: 6  Needles in: 6    Second 8 Minutes: Seating position on a the treatment table with 3 pillows on his lap  Bilateral: HT7,  L: Luo Zhen    Needles in: 3  Needles out: 3    Pyonex (0.84mm) were placed at Du14, GB15, GB31, Jian Zhong  I advised to keep them upto 5 days and take them off. However, if there is any irritability or discomfort felt, then remove them sooner. I also advised to discard the used one safely.      Post Treatment Note:   Devinn Hurwitz tolerated the all the treatment well without any complications.      Time Spent:   - Time spent for face-to-face for acupuncture:  23 minutes.    Cahlil Sattar has shown improvement of the chief complaint. Since Micael started the acupuncture treatment, he noted lower back pain issue showed some positive improvements, and I believe that continuous acupuncture treatments would be beneficial for [place] suggest another 6 sessions of treatment, and reassess at the 4th session.       Nehemiah Settle, [place], [place], [place]  Licensed Acupuncturist, Scientist, physiological for [place] [facility] [place] [facility] [place]

## 2022-08-10 ENCOUNTER — Ambulatory Visit: Payer: Medicare PPO | Attending: Medical | Admitting: Medical

## 2022-08-10 ENCOUNTER — Encounter (HOSPITAL_BASED_OUTPATIENT_CLINIC_OR_DEPARTMENT_OTHER): Payer: Self-pay | Admitting: Medical

## 2022-08-10 ENCOUNTER — Telehealth (INDEPENDENT_AMBULATORY_CARE_PROVIDER_SITE_OTHER): Payer: Self-pay | Admitting: Acupuncturist

## 2022-08-10 VITALS — BP 153/88 | HR 82 | Temp 97.6°F | Ht 76.0 in | Wt 225.0 lb

## 2022-08-10 DIAGNOSIS — G894 Chronic pain syndrome: Secondary | ICD-10-CM

## 2022-08-10 DIAGNOSIS — M7918 Myalgia, other site: Secondary | ICD-10-CM

## 2022-08-10 DIAGNOSIS — G8929 Other chronic pain: Secondary | ICD-10-CM

## 2022-08-10 DIAGNOSIS — M545 Low back pain, unspecified: Secondary | ICD-10-CM | POA: Insufficient documentation

## 2022-08-10 NOTE — Progress Notes (Signed)
Review of Systems   Constitutional: Negative.    HENT: Negative.     Eyes: Negative.    Respiratory: Negative.     Cardiovascular: Negative.    Gastrointestinal: Negative.    Endocrine: Negative.    Genitourinary: Negative.    Musculoskeletal:  Positive for arthralgias, back pain, gait problem, myalgias, neck pain and neck stiffness.   Skin: Negative.    Allergic/Immunologic: Negative.    Neurological:  Positive for tremors, weakness and numbness.   Hematological: Negative.    Psychiatric/Behavioral: Negative.

## 2022-08-10 NOTE — Telephone Encounter (Signed)
Pt's name was in acupuncture waiting list and Dr. Mel Almond has two cancellation on Wednesday, 08/12/22 @ 2 pm and Thursday, 08/13/22 @ 1:30 pm. I called pt's preferred phone number and left message to inform these cancellation information. Also, sent Mychart message to pt.

## 2022-08-10 NOTE — Patient Instructions (Signed)
It was my pleasure to help take part in your care today.  Thank you for coming to visit us at the [facility]. Please contact our clinic if you have any questions or concerns.    Here is a copy of a draft of our suggestions and other pain information.     You underwent trigger point injections today. Over the next day or so, it may feel uncomfortable/sore at the procedure sites, but this should resolve over the next day or two. If the discomfort is bothersome, I recommend the following:   Applying ice to the affected area for 15 minutes, 4 times per day until pain resolves   Applying lidocaine 1% gel to the procedure site   Applying over the counter lidocaine 4% patches to the procedure site  Taking an additional dose of acetaminophen (Tylenol), ibuprofen, or naproxen (Aleve).      If any other issues occur, please contact the clinic for further guidance.    Otherwise, hopefully the procedure helped, and we will plan to see you back in 2 to 3 weeks for a postprocedure follow-up to review the results and for further treatment planning.

## 2022-08-10 NOTE — Progress Notes (Signed)
PROCEDURE NOTE  TRIGGER POINT INJECTIONS UNDER ULTRASOUND GUIDANCE, #3 of 3  08/10/2022    PRE-PROCEDURE DIAGNOSIS  (M79.18) Myofascial pain  (primary encounter diagnosis)  (M54.50,  G89.29) Chronic bilateral low back pain without sciatica  (G89.4) Chronic pain syndrome.    POST-PROCEDURE DIAGNOSIS  (M79.18) Myofascial pain  (primary encounter diagnosis)  (M54.50,  G89.29) Chronic bilateral low back pain without sciatica  (G89.4) Chronic pain syndrome.    INDICATION FOR INJECTION  Chronic low back pain with myofascial components.  I described the disorder in terms that were understandable to the patient. I reviewed the risks, benefits, and alternative techniques for treatment as well as the option of no treatment. Patient was encouraged to ask questions and expressed understanding and acceptance.    PERFORMING PROVIDER: Rock Nephew, PA-C    SEDATION: NO    NPO STATUS:  NO    Guidance:  Ultrasound guidance used for this procedure, appropriate images viewed and saved to the medical record.    DESCRIPTION OF THE PROCEDURE  Position: prone  Monitoring: standard    Preparation: The injection area was prepped with Chloraprep.  Imaging for needle placement: Linear transducer placed over the painful areas.  Needle used: 25g, 1.5 inch.  Needle insertion: Out-of plane.  Imaging conditions: Good visualization of the muscle planes.    Ultrasound exam identifies the target muscles and fascia layers to be injected, with special attention paid to nearby neurovascular structures.    A twitch response was elicited at each site prior to injection. After negative aspiration, a solution containing 11 mL of 0.25% Bupivacaine without epinephrine was distributed evenly between 10 targets under live ultrasound visualization in the following muscle regions:   Left latissimus dorsi  Left lumbar erector spinae  Left serratus posterior inferior  Left internal oblique  Left external oblique    Uneventful removal of the needle. Ultrasound images  during the guided injections were saved.    POST-PROCEDURE:  He tolerated the procedure well and was monitored to be in stable condition where he made an uneventful recovery.    Mr. Hlavac was reassessed after the procedure.  The pain score was 7/10 before the procedure and 5-6/10 prior to discharge (0 = no pain, 10 = worst pain imaginable).  Mr. Genest did note initial improvement in pain, including pain with movement.     COMPLICATIONS:  none     ASSESSMENT/PLAN:  Mr. Moncus was instructed to follow up in 2-3 weeks for the next scheduled procedure.    He reports his last TPIs lasted longer than before, even until today.      Rock Nephew, PA-C  Physician [place] for [facility]  Department of Anesthesiology & Pain Medicine

## 2022-08-12 ENCOUNTER — Telehealth (INDEPENDENT_AMBULATORY_CARE_PROVIDER_SITE_OTHER): Payer: Self-pay | Admitting: Acupuncturist

## 2022-08-12 NOTE — Telephone Encounter (Signed)
Pt's name was in acupuncture waiting list and pt was looking for week of 1/22nd. Dr. Mel Almond has a cancellation on Thursday, 08/27/22 @ 3 pm, so I called pt's preferred phone number and spoke to pt. Pt scheduled his acupuncture appointment on Thursday, 08/27/22 @ 3 pm.

## 2022-08-13 ENCOUNTER — Other Ambulatory Visit (INDEPENDENT_AMBULATORY_CARE_PROVIDER_SITE_OTHER): Payer: Self-pay | Admitting: Internal Medicine

## 2022-08-13 DIAGNOSIS — L309 Dermatitis, unspecified: Secondary | ICD-10-CM

## 2022-08-14 MED ORDER — BETAMETHASONE DIPROPIONATE 0.05 % EX LOTN
TOPICAL_LOTION | CUTANEOUS | 5 refills | Status: DC
Start: 2022-08-14 — End: 2023-06-16

## 2022-08-14 NOTE — Telephone Encounter (Signed)
Next visit 08/31/22    Last prescribed by provider with 11 refills

## 2022-08-18 ENCOUNTER — Encounter (HOSPITAL_BASED_OUTPATIENT_CLINIC_OR_DEPARTMENT_OTHER): Payer: Medicare PPO | Admitting: Medical

## 2022-08-18 NOTE — Progress Notes (Signed)
[facility] Follow Up Visit  08/25/2022    Ronnie Mason;    MRN: [id number];   DOB: 04-01-1951    Chief Complaint   Patient presents with    Pain     History of Present Illness:  Ronnie Mason was last seen by myself on 08/10/2022 for TPIs.    He is here today for a follow up visit.      The patient has had 30% pain relief in the low back since the recent procedure (TPIs).  The relief has been sustained.    He reports that when he was walking through the snow and uneven ground, his low back pain was exacerbated. His pain radiates down his left posterior leg constantly, especially more when he is standing or stretching.     Past pain treatments/diagnostic testing include (copied forward and updated from the [facility] note dated 04/14/2019):   Dr. [name]  03/23/2018 - LEFT L5 TFESI with 80-85% relief for about 3-4 weeks of just axial back pain.   09/15/2017 - Bilateral L5 TFESI with 50-75% relief of mixture of low back and leg pain.   05/19/2017 - RIGHT L5 TFESI with 100% relief of Radicular symptoms for about 3 months  Dr. Amada Jupiter  Injections in our clinic have included (exluding MBB and trigger point injections):  08/12/2016 - L5-S1 ILESI  07/01/2016 - Ultrasound guided right trochanteric bursa injection   05/20/2016 - Bilateral shoulder injections  05/13/2016 - Right L3 L4 L5/S1 RFA  02/19/2016 -  Left L3, L4, L5/S1 RFA  12/27/2012 - L5-S1 IL ESI  08/31/2012 - Left L5 TFESI  03/30/2012 - Left L4/5, L5/S1 facet inj  02/10/2012 - Left C6 C7 MB RFA  07/03/2011 - Left L3, L4, L5/S1 RFA  05/12/2011 - Left C4, C5, C6 RFA    [facility] with the following problem list:    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]     URI (upper respiratory infection) [J06.9]     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] 07/17/2022    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, N13.8] 07/17/2022     Urge incontinence [N39.41] 07/17/2022    Mallet toe, right [M20.5X1] 04/23/2022     Added automatically from request for surgery [id number]      Chronic bilateral low back pain without sciatica [M54.50, G89.29] 04/29/2021    Psychological factors affecting medical condition [F54] 04/24/2021    Macrocytosis without anaemia [D75.89] 02/12/2020    Myofascial pain [M79.18] 10/20/2019    MRSA (methicillin resistant staph aureus) culture positive [Z22.322] 07/13/2019    Closed fracture of one rib of right side [S22.31XA] 01/26/2019     See x ray 01/26/2019   Right anterior lat sub acute chronic       Enuresis [R32] 09/06/2018    Chronic left-sided low back pain without sciatica [M54.50, G89.29] 06/21/2018    Acute left-sided low back pain without sciatica [M54.50] 03/14/2018    Greater trochanteric pain syndrome of right lower extremity [M25.551] 11/25/2017    Complex tear of medial meniscus of left knee as current injury [S83.232A] 09/08/2017    Primary osteoarthritis of left knee [M17.12] 09/08/2017    Right lumbar radiculitis [M54.16] 05/05/2017    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, 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] 04/13/2017    Primary osteoarthritis of right  hip [M16.11] 01/11/2017     Osteoarthritis of right hip x ray report - moderate to advanced radiology read 01/11/2017      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79] 12/16/2015     [place] 4/29- 12/10/2015  Non-traumatic intraparenchymal hemorrhage, hypertensive of the left thalamus   Traumatic hemorrhage   -- falcine subdural hemorrhage, small cortical subarachnoid      Alcohol abuse [F10.10] 12/16/2015    Cerebral ventriculomegaly [G93.89] 10/09/2015    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    History of alcohol dependence (HCC) [F10.21] 06/26/2015    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) [G37.9] 05/07/2015    Idiopathic peripheral neuropathy [G60.9] 03/15/2015    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] 05/28/2014    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] 01/18/2014    Tremor [R25.1] 04/20/2013    Fall [W19.XXXA] 02/23/2013     BP < 100  Occasioanal tri[p and leg weakness      Back pain, lumbosacral [M54.50] 02/15/2013    Lumbar radiculopathy, chronic [M54.16] 02/15/2013    Neck pain [M54.2] 02/15/2013    Chronic pain [G89.29] 12/21/2012     Now at [facility], DO, Santiago Glad    Neck pain -burn c3-7 on left side per patient  Some trigger injection    Lumbar pain-since 80's better with exercise      Bee sting allergy [Z91.030] 12/21/2012     hives      Numbness and tingling of leg [R20.0, R20.2] 12/21/2012     Left calf -left foot long term suspect from back-suspect L 45      Chronic back pain [M54.9, G89.29] 12/21/2012     Mri in mid scape 1/214  Multilevel degenerative disc disease of the lumbar spine. Circumferential disc bulge at all levels below and including L1-2. No significant central spinal canal stenosis or neuroforaminal narrowing.  2. Mild retrolisthesis of L5 on S1.            B12 deficiency [E53.8] 12/21/2012     Borderline low 5 /21/2014      Chronic renal insufficiency, stage III (moderate) (HCC) [N18.30] 12/21/2012     5/ 2013      Hyperlipidemia [E78.5] 09/26/2012    Cervicalgia [M54.2] 03/11/2010     Radiofrequency ablation of c3 to c 7        Pain Tracker Scores - reviewed.    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    Idiopathic  peripheral neuropathy 03/15/2015    Spondylosis of cervical region without myelopathy or radiculopathy 05/28/2014    Back pain, lumbosacral 02/15/2013    B12 deficiency 12/21/2012    Borderline low 5 /21/2014    Chronic renal insufficiency, stage III (moderate) (HCC) 12/21/2012    5/ 2013    Hyperlipidemia 09/26/2012    Cervicalgia 03/11/2010    Radiofrequency ablation of c3 to c 7    Allergic rhinitis due to allergen     Carotid Sinus Hypersensitivity     Chronic pain     chronic LUE pain had injection.    Disc disorder of lumbosacral region     Hip injury     HYPERTENSION      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[facility](HCC)      Past Surgical History:   Procedure Laterality Date    L meniciscus Left 07/2017    by Dr Marcelle OverlieHolland , re did left menisicus     PR ANES; COLONOSCOPY  2002    repeat in 3 years    PR ANES; COLONOSCOPY & POLYPECTOMY  11/18/2007    repeat in 3 years    PR CORRECTION HAMMERTOE Right 05/13/2022    Dr. Hilarie Fredricksonony Kim, DPM    PR HALLUX RIGIDUS W/CHEILECTOMY 1ST MP JT W/O IMPLT Right 10/21/2017    Dr. Donn PieriniBrian McInnes    PR UNLISTED PROCEDURE FEMUR/KNEE      PR UNLISTED PROCEDURE HANDS/FINGERS      PR UNLISTED PROCEDURE SPINE  2013    rfa to c3 to c 7     SURGICAL HX OTHER Right 12/03/2020    RIGHT foot WOUND INCISION AND DRAINAGE    TOE SURGERY Right 02/09/2019    toe surgery  Right 01/12/2019    Dr Hilarie Fredricksonony Kim      Family History       Problem (# of Occurrences) Relation (Name,Age of Onset)    Heart (other) (1) Mother: MI    Other Family Hx (1) Other: myelodysplasia    Colon Cancer (1) Father           Negative family  history of: Cancer          Social History     Tobacco Use    Smoking status: Never    Smokeless tobacco: Never    Tobacco comments:     Sometimes chews, but is trying to quit. 11/28/21   Substance Use Topics    Alcohol use: Yes     Alcohol/week: 7.0 standard drinks of alcohol     Types: 7 Glasses of wine per week     Comment: goal to get down to 2 per day     Current Outpatient Medications   Medication Sig Dispense Refill    Acetaminophen 500 MG Oral Tab Take 2 tablets (1,000 mg) by mouth 2 times a day.      Alpha-Lipoic Acid 300 MG tablet Take 1 tablet by mouth every morning. Benfotiamine- Alpha Lipoic acid      amLODIPine 5 MG tablet Take 1 tablet (5 mg) by mouth 2 times a day. 180 tablet 3    atorvastatin 10 MG tablet Take 1 tablet (10 mg) by mouth daily. (Patient taking differently: Take 1 tablet (10 mg) by mouth every morning.) 90 tablet 3    BENFOTIAMINE OR Take 250 mg by mouth every morning.      betamethasone dipropionate 0.05 % lotion APPLY TO SCALP AND EARS TOPICALLY 2 TIMES A DAY AS NEEDED FOR ITCHING. Notify MD of  any signs of infection or skin thinning 60 mL 5    Cholecalciferol (VITAMIN D3) 2000 units Oral Cap Take 1 capsule (2,000 Units) by mouth daily. For low level (Patient taking differently: Take 1 capsule (2,000 units) by mouth every morning. For low level) 1 capsule 1    clindamycin 1 % gel Apply 1 application  topically 2 times a day as needed. Apply to back.      CLINDAMYCIN HCL OR Take by mouth. CLINDAMYCIN CREAM      Cyanocobalamin 1000 MCG Oral Tab one per day over-the-counter started 5/21 /2012 this level borderline low (Patient taking differently: Take 1 tablet (1,000 mcg) by mouth every morning.) 1 Tab 1    diazePAM 2 MG tablet Take 1 tablet (2 mg) by mouth daily as needed for anxiety. Take tab 30 minutes before check in. Repeat dose if needed at check in 2 tablet 0    diclofenac 1 % gel Apply 4 g topically 4 times a day. Apply to 2 gram four times daily to neck, trapezius muscle, and  low back. Use a max of 32 grams a day. (Patient taking differently: Apply 4 g topically 4 times a day as needed. Apply to 2 gram four times daily to neck, trapezius muscle, and low back. Use a max of 32 grams a day.) 300 g 4    DULoxetine 60 MG DR capsule Take 1 capsule (60 mg) by mouth 2 times a day. 180 capsule 2    EPINEPHrine 0.3 MG/0.3ML auto-injector INJECT INTRAMUSCULARLY ONCE AS NEEDED FOR SEVERE ALLERGIC REACTION 4 each 3    ibuprofen 200 MG tablet Take 1 tablet (200 mg) by mouth daily as needed (pain).      lidocaine 5 % patch Apply 1 patch onto the skin daily. Apply to painful area for up to 12 hours in a 24 hour period. (Patient taking differently: Apply 1 patch onto the skin daily as needed. Apply to painful area for up to 12 hours in a 24 hour period.) 30 patch 2    lisinopril 20 MG tablet Take 1 tablet (20 mg) by mouth 2 times a day. 180 tablet 3    mirabegron ER 25 MG 24 hr tablet Take 1 tablet (25 mg) by mouth daily. 30 tablet 12    naltrexone 50 MG tablet TAKE 1 TABLET BY MOUTH DAILY WITH FOOD 30 tablet 11    Omega-3 Fatty Acids (OMEGA 3 OR) Take 1 tablet by mouth every morning.      pregabalin 300 MG capsule TAKE 1 CAPSULE BY MOUTH TWICE DAILY 180 capsule 0    primidone 50 MG tablet Take 1 tablet (50 mg) by mouth 2 times a day. 180 tablet 4     No current facility-administered medications for this visit.      Review of patient's allergies indicates:  Allergies   Allergen Reactions    Adhesives Skin: Rash     Glue on lido patches    Bee Venom Skin: Hives, Skin: Itching and Swelling    Gabapentin Other     Flu like symptoms, diarrhea, body ache upset stomach    Methocarbamol Other     Patient's wife reports cognitive difficulties ("goofy")       The past medical, surgical, family, and social histories were reviewed and updated personally with Mr. Portnoy.  Medications and allergies were also reviewed and confirmed personally.  He reports no other change in past medical history, past surgical history,  family history, or social history  since last visit except as noted above.      Review of Systems:  The Review of Systems was provided by Mr. Englander and entered in the [place] note associated with this encounter.  I did personally reviewed and confirmed the ROS information with Mr. Baisley and have the following additional comments:   NONE.    Physical Examination:   Vitals:    08/25/22 0828   BP: (!) 146/84   Pulse: 79   SpO2: 94%     General:  healthy-appearing  well-nourished  no acute distress  Mental Status:  active  normal mood  normal affect  Alert and oriented x3  Head:  normal appearing  Cervical spine:   no pain with flexion  moderate pain with extension  moderate tenderness over the lower facet joints and paraspinous muscles to palpation  moderate pain with extension and bilateral facet loading  positive Spurling's test   Lumbar spine:   no pain with flexion  moderate pain with extension  moderate tenderness over the lower facet joints and paraspinous muscles to palpation  moderate pain with extension and bilateral facet loading  positive straight leg raise (SLR) left   Gait:  normal gait  normal station  Neuro:  Cranial nerves 2-12 grossly intact  Motor function intact  Sensation:  decreased to light touch over the left L5 and S1 dermatomes  Skin:  No obvious rashes, lesions, or erythema seen     Assessment/Discussion  (M79.18) Myofascial pain  (primary encounter diagnosis)  (M54.50,  G89.29) Chronic bilateral low back pain without sciatica  (M54.16) Chronic left-sided lumbar radiculopathy  (M47.816) Lumbar facet arthropathy  (M54.2) Cervicalgia  (M54.12) Cervical radiculopathy, chronic  (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  He has failed conservative therapies  including x2 with cervical thoracic epidural steroid injections (1 week relief) in the past. His most recent right sided cervical trigger point injections have been significantly helpful for only his neck and not his radiating arm pain.     The patient's clinical presentation appears consistent with left-sided cervical radiculopathy and lumbar radiculopathy with component of lumbar facet joint syndrome.    Regarding his cervical pain, he is scheduled for an EMG of his upper extremities later today and for a neurosurgery reevaluation next week. We discussed that if he ends up not being a good surgical candidate, then he may be a good candidate for a cervical spinal cord stimulator trial. He seems to be somewhat interested in this option.    For his lumbar pain, this appears to be multifactorial from his facet arthropathy and spinal stenosis. He cannot recall undergoing a lumbar epidural steroid injection before, so this may be a good first interventional treatment based on his results of his imaging exams.    I recommended that he undergo all of his diagnostic testing and then return to help formulate a plan of treatment moving forward.    Plan:      Diagnostic evaluation:   - Obtain updated lumbar spine XR and MRI to evaluate facet arthropathy and lumbar spinal stenosis.  - Obtain EMG as previously scheduled by Dr. Welton Flakes (neurosurgery).    Rehabilitation:   - Continue home exercises.    Medication suggestions:   - Continue current medications as prescribed.  Procedure suggestions:   - Consider cervical SCS for cervical radiculopathy in the future.  - Consider lumbar MBBs/RFAs in the future once imaging results come back.    Follow-up:   - 2-3 weeks after neurosurgery consultation on 09/01/2022, EMG, XR, and MRI are complete.    I spent a total of 45 minutes for the patient's care on the date of the service.          Ronnie Nephew, Ronnie Mason  Physician Assistant - [place] for [place] of  [facility]  Anesthesiology & Pain Medicine

## 2022-08-22 ENCOUNTER — Ambulatory Visit
Admission: RE | Admit: 2022-08-22 | Discharge: 2022-08-22 | Disposition: A | Discharge status: Home / Self Care | Payer: Medicare PPO | Attending: Unknown Physician Specialty | Admitting: Unknown Physician Specialty

## 2022-08-22 DIAGNOSIS — I6789 Other cerebrovascular disease: Secondary | ICD-10-CM | POA: Insufficient documentation

## 2022-08-22 DIAGNOSIS — G9389 Other specified disorders of brain: Secondary | ICD-10-CM | POA: Insufficient documentation

## 2022-08-24 ENCOUNTER — Other Ambulatory Visit (INDEPENDENT_AMBULATORY_CARE_PROVIDER_SITE_OTHER): Payer: Self-pay | Admitting: Internal Medicine

## 2022-08-24 ENCOUNTER — Other Ambulatory Visit (HOSPITAL_BASED_OUTPATIENT_CLINIC_OR_DEPARTMENT_OTHER): Payer: Self-pay | Admitting: Psychiatry

## 2022-08-24 DIAGNOSIS — M792 Neuralgia and neuritis, unspecified: Secondary | ICD-10-CM

## 2022-08-24 DIAGNOSIS — F1020 Alcohol dependence, uncomplicated: Secondary | ICD-10-CM

## 2022-08-24 MED ORDER — NALTREXONE HCL 50 MG OR TABS
ORAL_TABLET | ORAL | 11 refills | Status: DC
Start: 2022-08-24 — End: 2023-09-09

## 2022-08-25 ENCOUNTER — Ambulatory Visit (HOSPITAL_BASED_OUTPATIENT_CLINIC_OR_DEPARTMENT_OTHER): Payer: Medicare PPO | Admitting: Medical

## 2022-08-25 ENCOUNTER — Telehealth (HOSPITAL_BASED_OUTPATIENT_CLINIC_OR_DEPARTMENT_OTHER): Payer: Self-pay

## 2022-08-25 ENCOUNTER — Ambulatory Visit (INDEPENDENT_AMBULATORY_CARE_PROVIDER_SITE_OTHER): Payer: Medicare PPO | Admitting: Physical Medicine & Rehabilitation

## 2022-08-25 ENCOUNTER — Ambulatory Visit
Admission: RE | Admit: 2022-08-25 | Discharge: 2022-08-25 | Disposition: A | Discharge status: Home / Self Care | Payer: Medicare PPO | Attending: Unknown Physician Specialty | Admitting: Unknown Physician Specialty

## 2022-08-25 VITALS — BP 146/84 | HR 79 | Wt 225.0 lb

## 2022-08-25 DIAGNOSIS — G894 Chronic pain syndrome: Secondary | ICD-10-CM | POA: Insufficient documentation

## 2022-08-25 DIAGNOSIS — M542 Cervicalgia: Secondary | ICD-10-CM | POA: Insufficient documentation

## 2022-08-25 DIAGNOSIS — M5412 Radiculopathy, cervical region: Secondary | ICD-10-CM

## 2022-08-25 DIAGNOSIS — M5416 Radiculopathy, lumbar region: Secondary | ICD-10-CM

## 2022-08-25 DIAGNOSIS — M47816 Spondylosis without myelopathy or radiculopathy, lumbar region: Secondary | ICD-10-CM

## 2022-08-25 DIAGNOSIS — R202 Paresthesia of skin: Secondary | ICD-10-CM

## 2022-08-25 DIAGNOSIS — R94131 Abnormal electromyogram [EMG]: Secondary | ICD-10-CM

## 2022-08-25 DIAGNOSIS — M4802 Spinal stenosis, cervical region: Secondary | ICD-10-CM

## 2022-08-25 DIAGNOSIS — M47812 Spondylosis without myelopathy or radiculopathy, cervical region: Secondary | ICD-10-CM

## 2022-08-25 DIAGNOSIS — M7918 Myalgia, other site: Secondary | ICD-10-CM

## 2022-08-25 DIAGNOSIS — G8929 Other chronic pain: Secondary | ICD-10-CM | POA: Insufficient documentation

## 2022-08-25 DIAGNOSIS — M545 Low back pain, unspecified: Secondary | ICD-10-CM | POA: Insufficient documentation

## 2022-08-25 MED ORDER — PREGABALIN 300 MG OR CAPS
300.0000 mg | ORAL_CAPSULE | Freq: Two times a day (BID) | ORAL | 0 refills | Status: DC
Start: 2022-08-25 — End: 2022-12-01

## 2022-08-25 NOTE — Telephone Encounter (Signed)
LVM to change next f/u appt to 60 minutes per Ronnie Mason. Can offer 2/7 or next avail 60 min slot.    CCR may reschedule

## 2022-08-25 NOTE — Progress Notes (Signed)
EMG/NCS    Test Date:  08/25/2022      Patient ID: [id number] DOB: 16-Jan-1951 Ref Phys: Dimas Aguas, MD   Name: Ronnie Mason   Physician: Jacklynn Barnacle, MD   DOB: [birth year] Weight: 225 lbs. Height: 6\' 4"        CC: Patient is a 72 year old  referred for chief complaint of L arm and neck pain .     HPI: Sx started over 10 yrs ago and have been ongoing. He has had tx including L cervical RFA and interlaminar ESIs in the C spine. Sx radiate into the L upper lateral arm to elbow. No numbness or tingling, just pain.       Exam:  There were no vitals taken for this visit.  General-Healthy appearing,   Resp-Comfortable on room air  Cardio-No LE edema  Skin-No rashes or wounds visible.  MSK/Neuro  Inspection: No hand intrinsic atrophy, no scapular winging  Strength: 5/5 bilateral shoulder abduction, elbow flexion, elbow extension, wrist extension and flexion, finger flexion (DIP and PIP), finger abduction, finger extension, thumb abduction  Sensation: Intact to light touch in C5-T1 dermatomes  Special:   Negative' Carpal Compression  Negative Hoffman's bilaterally  Positive spurlings    Consent: The study was described to the patient including risks, benefits, alternatives which include but are not limited to bleeding, pain, infection, nerve injury, and the patient agreed to proceed. Consent form was signed by patient and provider and will be scanned into the chart.  Timeout was performed to identify correct procedure, patient, side of procedure, allergies.    NCS Results:  Left Median motor to abductor pollicis brevis with prolonged onset latency, normal amplitude and conduction velocity across the forearm  Left Ulnar motor to abductor digiti minimi is normal  Left Median sensory studies with prolonged peak latencies and normal amplitudes  The Combined Sensory Index (CSI) is 2.4 (normal is <1.0). The CSI is calculated as Thumbdiff (median minus radial latency to thumb) + Ringdiff (median minus ulnar latency to ring finger) +  Palmdiff (median minus ulnar latency across the palm).**  Left Ulnar and radial sensory studies are normal     EMG Results:  The EMG studies noted signs of muscle membrane instability (eg positive sharp waves or fibrillation potentials) only in the mid and lower cervical paraspinals. 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 The findings were chronic in nature in the extremity. There was acute denervation seen only in the cervical paraspinals in the mid and lower region.   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   There is NO electrodiagnostic evidence of left ulnar neuropathy at the elbow.     The preliminary diagnostic impression was reviewed with the patient, and I attempted to answer all questions relating to this preliminary impression.      I provided brief education on carpal tunnel syndrome, risk factors, and activity modification.    I provided brief education on cervical motor radiculopathy and natural history.    RECOMMENDATIONS:  -F/u with ordering provider      Jacklynn Barnacle, MD  Physical Medicine and Rehabilitation, [facility]      Nerve Conduction  Studies  Motor Sites      Onset (ms) O-P Amp Neg. Dur Neg Area Segment Delta-O Distance CV Temp   Site (ms) (mV) ms ms*mV  ms cm m/s C   Left Median (APB) Motor   Wrist *4.6 5.2 4.7 13.6     27.4   Elbow 9.9 4.2 5.1 12.0 Elbow-Wrist 5.3 27 51 27.5   Left Ulnar (ADM) Motor   Wrist 3.2 7.0 5.2 22.9     27.6   Bel Elbow 7.3 7.1 5.3 21.5 Bel Elbow-Wrist 4.1 23 56 27.8   Ab Elbow 9.6 7.1 5.8 23.8 Ab Elbow-Bel Elbow 2.3 11 48 28.7     Sensory Sites      Onset Peak O-P Amp Temp  Distance CV   Site (ms) (ms) V Norm C cm m/s Norm   Left Median-Radial (Dig I) Sensory        Median   Wrist-Dig I 2.7 *3.1 9 - 28.8 10 37 -        Radial   Wrist-Dig I 2.1 *2.8 10 - 28.5 10 48 -   Left Median-Ulnar (Dig IV) Sensory        Median   Wrist-Dig IV 3.5 *4.3 6 - 28.2 14 40 -        Ulnar   Wrist-Dig IV 2.7 3.3 4 - 28.4 14 52 -   Left Median-Ulnar Palmar Sensory        Median   Palm-Wrist 2.0 *2.6 27 - 29.3 8 40 -        Ulnar   Palm-Wrist 1.03 1.50 11 - 29.4 - - -     Electromyography     Side Muscle Nerve Root Ins Act FibsPsw Fascic Amp Dur Poly Recrt Int Fraser Din Comment   Left Deltoid Axillary C5-C6 Nml Nml Nml Nml *Incr *Incr *MildRed *75%    Left Biceps Musculocut C5-C6 Nml Nml Nml *MildIncr *Incr *Incr Nml Nml    Left Triceps Radial C6-C8 Nml Nml Nml Nml Nml Nml Nml Nml    Left Brachiorad Radial C5-C6 Nml Nml Nml Nml Nml Nml Nml Nml    Left FDI Ulnar C8-T1 Nml Nml Nml Nml Nml Nml Nml Nml    Left Cervical PSP (lower) Rami C7-C8 *Incr *1+ *1+         Left Cervical PSP (mid) Rami C4-C6 *Incr *1+ *1+         Left Cervical PSP (upper) Rami C1-C3 Nml Nml Nml           Waveforms:    Motor         Sensory

## 2022-08-25 NOTE — Progress Notes (Signed)
Review of Systems   Musculoskeletal:  Positive for arthralgias, back pain, gait problem, myalgias and neck pain.   All other systems reviewed and are negative.

## 2022-08-25 NOTE — Patient Instructions (Signed)
It was my pleasure to help take part in your care today.  Thank you for coming to visit [place] at the [facility]. Please contact our clinic if you have any questions or concerns.    Here is a copy of a draft of our suggestions and other pain information.     Plan:      Diagnostic evaluation:   - Obtain updated lumbar spine XR and MRI to evaluate facet arthropathy and lumbar spinal stenosis.  - Obtain EMG as previously scheduled by Dr. Humphrey Rolls (neurosurgery).    Rehabilitation:   - Continue home exercises.    Medication suggestions:   - Continue current medications as prescribed.    Procedure suggestions:   - Consider cervical SCS for cervical radiculopathy in the future.  - Consider lumbar MBBs/RFAs in the future once imaging results come back.    Follow-up:   - 2-3 weeks after neurosurgery consultation on 09/01/2022, EMG, XR, and MRI are complete.    Please update PainTracker at [email or web address] prior to your next visit to the [facility].   For log in assistance or to reset your PainTracker, please call clinic at [phone], #[phone].

## 2022-08-26 ENCOUNTER — Ambulatory Visit (HOSPITAL_BASED_OUTPATIENT_CLINIC_OR_DEPARTMENT_OTHER): Payer: Medicare PPO | Admitting: Laboratory Services

## 2022-08-26 ENCOUNTER — Ambulatory Visit: Payer: Medicare PPO | Attending: Clinical Neuropsychologist | Admitting: Clinical Neuropsychologist

## 2022-08-26 DIAGNOSIS — I6789 Other cerebrovascular disease: Secondary | ICD-10-CM

## 2022-08-26 DIAGNOSIS — G9389 Other specified disorders of brain: Secondary | ICD-10-CM | POA: Insufficient documentation

## 2022-08-26 NOTE — Progress Notes (Signed)
Interview completed with the patient who was accompanied by his wife. Neuropsychological tests were chosen and will administered by Erline Levine under my direction. Feedback is scheduled for 10/01/2022.    Distant Site Telemedicine Encounter  I conducted this encounter via secure, live, face-to-face video conference with the patient. I reviewed the risks and benefits of telemedicine as pertinent to this visit and the patient agreed to proceed.    Provider Location: Off-site location (home, non-[place] location)  Patient Location: At home  Present with patient: Family member

## 2022-08-26 NOTE — Progress Notes (Signed)
Neuropsychology Testing Service, Rehabilitation Medicine  Administered neuropsychological tests to patients under the direct supervision of Nickolas Dasher Ph.D., ABPP-CN  Morning Session: 10:40am - 11:45am  Afternoon Session: 12:40pm - 2:30pm   Scoring: 3:00pm - 4:30pm   Total billing is 4.5 hours.   Tests scored and forwarded to Dr. Evorn Gong

## 2022-08-27 ENCOUNTER — Ambulatory Visit
Admission: RE | Admit: 2022-08-27 | Discharge: 2022-08-27 | Disposition: A | Discharge status: Home / Self Care | Payer: Medicare PPO | Attending: Neurology | Admitting: Neurology

## 2022-08-27 ENCOUNTER — Encounter (INDEPENDENT_AMBULATORY_CARE_PROVIDER_SITE_OTHER): Payer: Medicare PPO | Admitting: Internal Medicine

## 2022-08-27 ENCOUNTER — Ambulatory Visit (INDEPENDENT_AMBULATORY_CARE_PROVIDER_SITE_OTHER): Payer: Medicare PPO | Admitting: Acupuncturist

## 2022-08-27 DIAGNOSIS — G609 Hereditary and idiopathic neuropathy, unspecified: Secondary | ICD-10-CM | POA: Insufficient documentation

## 2022-08-27 NOTE — Progress Notes (Signed)
Patient: Ronnie Mason  DOB: 1950-11-13 (72 year old)  Date of Visit: 08/28/2022    CC:  Incontinence    SUBJECTIVE:  17 July 2022: Initial evaluation    72 year old male with traumatic brain injury, hydrocephalus, hypertension, chronic renal insufficiency, sent for evaluation of incontinence     Review of records:  -Review of med list shows no specific bladder medications  - Labs 24 Mar 2022: BUN 22, creatinine 0.96     Patient is here with his wife, Ronnie Mason who provides for some of his history and is present for the interview  - Symptoms have been present for about 3 to 4 years, somewhat worsening recently  - Large episode of urge incontinence up to 3 times weekly  - Especially noticeable when standing up from sitting for a long time  - Minimal daytime frequency  - Has significant enuresis  - Also notes nocturia x 2  - Minimal obstructive symptoms     - In 2020, tried Solifenacin with Gayleen Orem, ARNP at [facility]  - Does not recall it being particularly helpful, also was concerned about mental health status changes with the medication     -No family history of prostate cancer  - Reports PSAs were checked by primary care physician in the past but have not been recently    - UA benign  - Bladder emptied well  - Prostate exam benign  - Minimal obstructive symptoms on IPSS    - Rx'd mirabegron for urge incontinence    Patient is here for review of urge incontinence, nocturia and enuresis on mirabegron    Now:  - Patient is here with his wife who takes notes  - No side effects on mirabegron  - No longer has enuresis  - Continues to have nocturia x 2-3  - Decreased urgency during the day  - Continued frequency  - Would consider further medication for the symptoms          I reviewed the patient's recorded medical history, and updated as appropriate the medications, problem list, allergies, and past medical history with the patient.     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

## 2022-08-28 ENCOUNTER — Ambulatory Visit: Payer: Medicare PPO | Attending: Urology | Admitting: Urology

## 2022-08-28 VITALS — BP 115/73 | HR 79

## 2022-08-28 DIAGNOSIS — N401 Enlarged prostate with lower urinary tract symptoms: Secondary | ICD-10-CM | POA: Insufficient documentation

## 2022-08-28 DIAGNOSIS — N3941 Urge incontinence: Secondary | ICD-10-CM | POA: Insufficient documentation

## 2022-08-28 DIAGNOSIS — N138 Other obstructive and reflux uropathy: Secondary | ICD-10-CM | POA: Insufficient documentation

## 2022-08-28 MED ORDER — MIRABEGRON ER 50 MG OR TB24
50.0000 mg | EXTENDED_RELEASE_TABLET | Freq: Every day | ORAL | 12 refills | Status: DC
Start: 2022-08-28 — End: 2022-10-20

## 2022-08-28 NOTE — Patient Instructions (Signed)
1.  New prescription for mirabegron 50 mg sent to pharmacy for urinary leaking, frequency and nighttime urination    2.  Follow-up in 6 weeks for reevaluation of the higher dose of mirabegron

## 2022-08-31 ENCOUNTER — Encounter (INDEPENDENT_AMBULATORY_CARE_PROVIDER_SITE_OTHER): Payer: Self-pay | Admitting: Internal Medicine

## 2022-08-31 ENCOUNTER — Ambulatory Visit (INDEPENDENT_AMBULATORY_CARE_PROVIDER_SITE_OTHER): Payer: Medicare PPO | Admitting: Internal Medicine

## 2022-08-31 ENCOUNTER — Encounter (INDEPENDENT_AMBULATORY_CARE_PROVIDER_SITE_OTHER): Payer: Self-pay

## 2022-08-31 VITALS — BP 136/83 | HR 86 | Temp 98.1°F | Ht 76.0 in | Wt 225.0 lb

## 2022-08-31 DIAGNOSIS — F01A0 Vascular dementia, mild, without behavioral disturbance, psychotic disturbance, mood disturbance, and anxiety: Secondary | ICD-10-CM

## 2022-08-31 DIAGNOSIS — N1831 Chronic kidney disease, stage 3a: Secondary | ICD-10-CM

## 2022-08-31 DIAGNOSIS — L97501 Non-pressure chronic ulcer of other part of unspecified foot limited to breakdown of skin: Secondary | ICD-10-CM

## 2022-08-31 DIAGNOSIS — S069XAS Unspecified intracranial injury with loss of consciousness status unknown, sequela: Secondary | ICD-10-CM

## 2022-08-31 DIAGNOSIS — Z0000 Encounter for general adult medical examination without abnormal findings: Secondary | ICD-10-CM

## 2022-08-31 DIAGNOSIS — Z87820 Personal history of traumatic brain injury: Secondary | ICD-10-CM

## 2022-08-31 DIAGNOSIS — F09 Unspecified mental disorder due to known physiological condition: Secondary | ICD-10-CM

## 2022-08-31 DIAGNOSIS — F1021 Alcohol dependence, in remission: Secondary | ICD-10-CM

## 2022-08-31 DIAGNOSIS — G379 Demyelinating disease of central nervous system, unspecified: Secondary | ICD-10-CM

## 2022-08-31 DIAGNOSIS — Q043 Other reduction deformities of brain: Secondary | ICD-10-CM

## 2022-08-31 DIAGNOSIS — G3189 Other specified degenerative diseases of nervous system: Secondary | ICD-10-CM

## 2022-08-31 NOTE — Progress Notes (Signed)
ANNUAL WELLNESS VISIT    Sees Dr Arnold Long eye   Continue to see pain clinic Dr Deborah Chalk  Sees podiatry Dr Maudie Mercury  and urology and neurology [place]   Dr Kirke Shaggy ent  Rsv vac was at [facility] Humphrey Rolls this wk patient said to see next wk for neck   Seeing acupuncture Dr Mel Almond       Alcohol 1-2 glass at night 2-3 times per wk   Seeing Dr Gwyndolyn Saxon also           Ronnie Mason is a 72 year old male who presents for a subsequent Annual Wellness Visit.    Is this a Telemedicine or a Phone visit?  No       INFORMATION GATHERING:      The following areas were confirmed with patient/caregiver and/or updated in Epic at this visit:   Past Medical, Surgical, Family, and Social History  Current medications and supplements  Allergies  All of the above components have been reviewed and updated: yes    Opioid Use:   Patient not taking opioids       I have not reviewed the patient's risk factors for other substance use disorders.  If appropriate, I've referred the patient for treatment.      Please see today's HRA for review of patient's functional ability and level of safety. Concerns are addressed below in the Personalized Prevention Plan section.    For list of current providers and suppliers, see Care Team Section, EHR encounters, and/or HRA questionnaire for [facility] Medicine providers involved in care    Other providers and suppliers outside [place]:   Identified outside providers not included in records above (please specify): immunization       Depression screening:    PHQ-2: 0  PHQ-9 (if done): 0     EXAM:  BP 136/83   Pulse 86   Temp 36.7 C (Temporal)   Ht 6\' 4"  (1.93 m)   Wt (!) 102.1 kg (225 lb)   SpO2 96%   BMI 27.39 kg/m         GAIT:   Abnormal as assessed by direct observation (describe): waddle and duck like , he said right foot pain and continues to see Dr Maudie Mercury     COGNITION:     Abnormal as assessed by direct observation (describe): moca25/30 with poor recall           [place] (ACP)        Advance care planning:  Offered, patient declined    Advance directives are on file in patient's chart    ASSESSMENT:       [name] was seen for his Annual Wellness Visit, including identification of risk factors & conditions that may affect his health and function in the future.         There are no diagnoses linked to this encounter.    Actions at this visit:      Established or updated a written schedule of screening and prevention measures recommended and appropriate for Randol Kern for the next 5-10 years  Established or updated a list of his risk factors and conditions for which lifestyle or medical interventions are recommended or underway, including mental health risks and conditions, and including risks/benefits of treatment  Furnished personalized health advice and, as appropriate, referrals to health education or preventive counseling services or programs (such as fall prevention, tobacco cessation, physical activity, nutrition, cognition, weight loss)  All of the above components have been reviewed and updated. yes    Personalized Prevention Plan:      Based on today's evaluation, I recommend the following ways to improve your health or functioning.    You have the following risk factors and/or medical conditions for which there are recommended ways (included in this list) to help you stay as healthy as possible:  To follow up w pain clinic neurosurgery and podiatry  You can not be on both gabapentn and lyrica   Good you are keeping that alcohol down - because it does not your memory and choices   I think the traumatic brain injury adds to your cognition and mood     No blood or labs today       Here are screening & prevention measures recommended for you:  Health Maintenance   Topic Date Due    Medicare Annual Wellness Visit  04/01/2022    Depression Screening (PHQ-2)  01/08/2023    Lipid Disorders Screening  03/25/2023    Diabetes Screening  07/15/2025    Colorectal Cancer  Screening  09/30/2026    DTaP, Tdap and Td Vaccines (6 - Td or Tdap) 04/10/2032    Pneumococcal Vaccine: 65+ years  Completed    Zoster Vaccine  Completed    Influenza Vaccine  Completed    Hepatitis C Screening  Completed    COVID-19 Vaccine  Completed    Hepatitis A Vaccine  Aged Out    Hepatitis B Vaccine  Aged Out    HPV Vaccine  Aged Out       Please plan to have a Subsequent Annual Wellness Visit in 1 year.

## 2022-09-01 ENCOUNTER — Ambulatory Visit
Payer: Medicare PPO | Attending: Student in an Organized Health Care Education/Training Program | Admitting: Student in an Organized Health Care Education/Training Program

## 2022-09-01 ENCOUNTER — Encounter (HOSPITAL_BASED_OUTPATIENT_CLINIC_OR_DEPARTMENT_OTHER): Payer: Self-pay | Admitting: Student in an Organized Health Care Education/Training Program

## 2022-09-01 VITALS — BP 116/78 | HR 80 | Temp 97.4°F | Resp 16

## 2022-09-01 DIAGNOSIS — M5412 Radiculopathy, cervical region: Secondary | ICD-10-CM

## 2022-09-01 DIAGNOSIS — M4802 Spinal stenosis, cervical region: Secondary | ICD-10-CM

## 2022-09-01 NOTE — Progress Notes (Signed)
Neurosurgery Clinic Note     Patient Name: Ronnie Mason  Date of Service: 09/01/22     Patient ID: [id number]  Date of Birth: .09-26-1950    Clinician: Adolm Joseph, MBBS  Facility: [facility]   Location: [place] Referring Provider: No ref. provider found     This is a 71 year old gentleman who I have been following for neck and right shoulder and arm pain. He returns to discuss his EMG results which show left sided C5 and C6 radiculopathy. His symptoms are unchanged and still bothersome. Neck hurts more than arm, and he's not able to raise his left arm higher than shoulder level due to pain. He does attest to some gait issues and uses a cane at his home. He does not attest to any fine motor issues but notes that his hand writing has gotten worse over the past few years.     He also has longstanding lower back pain and radicular pain that shoots down the lateral aspect of both calves. This is made worse with activity and limits how long he can walk. He is pending an MRI of his lumbar spine tomorrow.     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 Based on his symptoms its possible that he is developing early signs of myelopathy with gait issues and hand writing problems. He is a candidate for cervical decompression and fusion (likely C4-6 ACDF) but he would like to exhaust all conservative measures before considering surgery. I think that is reasonable and he should continue monitoring his symptoms. For his lower back, if he would like a surgical opinion, I can assess his lumbar MRI when it's done. The patient and his wife are happy with the plan and will get back in touch if/when they would want to talk about surgery.

## 2022-09-02 ENCOUNTER — Ambulatory Visit
Admission: RE | Admit: 2022-09-02 | Discharge: 2022-09-02 | Disposition: A | Discharge status: Home / Self Care | Payer: Medicare PPO | Attending: Unknown Physician Specialty | Admitting: Unknown Physician Specialty

## 2022-09-02 DIAGNOSIS — M48061 Spinal stenosis, lumbar region without neurogenic claudication: Secondary | ICD-10-CM | POA: Insufficient documentation

## 2022-09-02 DIAGNOSIS — M5416 Radiculopathy, lumbar region: Secondary | ICD-10-CM

## 2022-09-08 ENCOUNTER — Ambulatory Visit (HOSPITAL_BASED_OUTPATIENT_CLINIC_OR_DEPARTMENT_OTHER): Payer: Medicare PPO | Admitting: Medical

## 2022-09-08 NOTE — Progress Notes (Deleted)
[facility] Follow Up Visit  09/09/2022    Ronnie Mason;    MRN: [id number];   DOB: 1951/05/19    No chief complaint on file.    History of Present Illness:  Ronnie Mason was last seen by myself on 08/25/2022 for a follow up. 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  Ronnie Mason has failed conservative therapies including x2 with cervical thoracic epidural steroid injections (1 week relief) in Ronnie past. His most recent right sided cervical trigger point injections have been significantly helpful for only his neck and not his radiating arm pain.     Ronnie Mason's clinical presentation appears consistent with left-sided cervical radiculopathy and lumbar radiculopathy with component of lumbar facet joint syndrome.     Regarding his cervical pain, Ronnie Mason is scheduled for an EMG of his upper extremities later today and for a neurosurgery reevaluation next week. We discussed that if Ronnie Mason ends up not being a good surgical candidate, then Ronnie Mason may be a good candidate for a cervical spinal cord stimulator trial. Ronnie Mason seems to be somewhat interested in this option.     For his lumbar pain, this appears to be multifactorial from his facet arthropathy and spinal stenosis. Ronnie Mason cannot recall undergoing a lumbar epidural steroid injection before, so this may be a good first interventional treatment based on his results of his imaging exams.     I recommended that Ronnie Mason undergo all of his diagnostic testing and then return to help formulate a plan of treatment moving forward.     Plan:       Diagnostic evaluation:   - Obtain updated lumbar spine XR and MRI to evaluate facet arthropathy and lumbar spinal stenosis.  - Obtain EMG as previously scheduled by Dr. Humphrey Rolls (neurosurgery).     Rehabilitation:   - Continue home exercises.     Medication suggestions:   - Continue current medications as prescribed.      Procedure suggestions:   - Consider cervical SCS for cervical radiculopathy in Ronnie future.  - Consider lumbar MBBs/RFAs in Ronnie future once imaging results come back.     Follow-up:   - 2-3 weeks after neurosurgery consultation on 09/01/2022, EMG, XR, and MRI are complete."    Interval medical history:  08/25/2022 sports medicine: Underwent EMG. Evidence of left cervical C5-C6 motor radiculopathy.  Ronnie Mason is here today for a follow up visit.      ***    Ronnie Mason reports that when Ronnie Mason was walking through Ronnie snow and uneven ground, his low back pain was exacerbated. His pain radiates down his left posterior leg constantly, especially more when Ronnie Mason is standing or stretching.     Past pain treatments/diagnostic testing include (copied forward and updated from [facility] note dated 04/14/2019):   Dr. Hinda Kehr  03/23/2018 - LEFT L5 TFESI with 80-85% relief for about 3-4 weeks of just axial back pain.   09/15/2017 - Bilateral L5 TFESI with 50-75% relief of mixture of low back and leg pain.   05/19/2017 - RIGHT L5 TFESI with 100% relief of Radicular symptoms for about 3 months  Dr. Quita Skye  Injections in our clinic have included (exluding MBB and trigger point injections):  08/12/2016 - L5-S1 ILESI  07/01/2016 - Ultrasound guided right trochanteric bursa injection   05/20/2016 - Bilateral shoulder injections  05/13/2016 - Right L3 L4  L5/S1 RFA  02/19/2016 -  Left L3, L4, L5/S1 RFA  12/27/2012 - L5-S1 IL ESI  08/31/2012 - Left L5 TFESI  03/30/2012 - Left L4/5, L5/S1 facet inj  02/10/2012 - Left C6 C7 MB RFA  07/03/2011 - Left L3, L4, L5/S1 RFA  05/12/2011 - Left C4, C5, C6 RFA    DIAGNOSTIC TESTING:  08/27/2022 EMG/Nerve Conduction Study  IMPRESSION:  Abnormal study  There is electrodiagnostic evidence of left cervical motor radiculopathy in a C5/ C6 distribution. Ronnie findings were chronic in nature in Ronnie extremity. There was acute denervation seen only in Ronnie cervical paraspinals in Ronnie mid and lower region.   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   There is NO electrodiagnostic evidence of left ulnar neuropathy at Ronnie elbow.      09/02/2022 MRI L SPINE W/O CONTRAST  IMPRESSION  Compared to Ronnie MRI of 07/11/2019:  1.  Progression of multilevel degenerative change resulting in moderate L3-L4 spinal canal stenosis with crowding of Ronnie cauda equina nerve roots.  2.  Moderate left L3-L4 and right L5-S1 foraminal narrowing.    08/25/2022 XR L SPINE 4+ VW  FINDINGS AND IMPRESSION  Straightening with mild dextrocurvature.     Vertebral body heights are maintained.     Similar appearance of moderate to advanced multilevel degenerative change most significant at L3-L4 through L5-S1 as evidenced by disc space narrowing, endplate spurring and facet degeneration.     Overlying soft tissues are grossly unremarkable.    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, N13.8] 07/17/2022    Urge incontinence [N39.41] 07/17/2022    Mallet toe, right [M20.5X1] 04/23/2022     Added automatically from request for surgery [id number]      Chronic bilateral low back pain without sciatica [M54.50, G89.29] 04/29/2021    Psychological factors affecting medical condition [F54] 04/24/2021    Macrocytosis without anaemia [D75.89] 02/12/2020    Myofascial pain [M79.18] 10/20/2019    MRSA (methicillin resistant staph aureus) culture positive [Z22.322] 07/13/2019    Closed fracture of one rib of right side [S22.31XA] 01/26/2019     See x ray 01/26/2019   Right anterior lat sub acute chronic       Enuresis [R32] 09/06/2018    Chronic left-sided low  back pain without sciatica [M54.50, G89.29] 06/21/2018    Acute left-sided low back pain without sciatica [M54.50] 03/14/2018    Greater trochanteric pain syndrome of right lower extremity [M25.551] 11/25/2017    Complex tear of medial meniscus of left knee as current injury [S83.232A] 09/08/2017    Primary osteoarthritis of left knee [M17.12] 09/08/2017    Right lumbar radiculitis [M54.16] 05/05/2017    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, 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] 04/13/2017    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] 01/11/2017     Osteoarthritis of right hip x ray report - moderate to advanced radiology read 01/11/2017      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] 08/06/2016    Impaired mobility and ADLs [Z74.09, Z78.9] 04/08/2016    Difficulty in  walking, not elsewhere classified [R26.2] 03/12/2016    Vitamin D deficiency [E55.9] 02/13/2016    Balance problem [R26.89] 01/23/2016    Chronic pain of both shoulders [M25.511, G89.29, M25.512] 01/23/2016     X ray arthritis  02/19/2016        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, 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, S06.9XAS] 01/06/2016    Essential hypertension [I10] 12/16/2015    Essential tremor [G25.0] 12/16/2015    Hx of spontan intraparenchymal intracran bleed assoc with hypertension [Z86.79] 12/16/2015     [place] 4/29- 12/10/2015  Non-traumatic intraparenchymal hemorrhage, hypertensive of Ronnie left thalamus   Traumatic hemorrhage   -- falcine subdural hemorrhage, small cortical subarachnoid      Alcohol abuse [F10.10] 12/16/2015    Cerebral ventriculomegaly [G93.89] 10/09/2015    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    History of alcohol dependence ([place]) [F10.21] 06/26/2015    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 ([place]) [G37.9] 05/07/2015    Idiopathic peripheral neuropathy [G60.9] 03/15/2015    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] 05/28/2014    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] 01/18/2014    Tremor  [R25.1] 04/20/2013    Fall [W19.XXXA] 02/23/2013     BP < 100  Occasioanal tri[p and leg weakness      Back pain, lumbosacral [M54.50] 02/15/2013    Lumbar radiculopathy, chronic [M54.16] 02/15/2013    Neck pain [M54.2] 02/15/2013    Chronic pain [G89.29] 12/21/2012     Now at [facility], DO, Robbie Lis    Neck pain -burn c3-7 on left side per Mason  Some trigger injection    Lumbar pain-since 80's better with exercise      Bee sting allergy [Z91.030] 12/21/2012     hives      Numbness and tingling of leg [R20.0, R20.2] 12/21/2012     Left calf -left foot long term suspect from back-suspect L 45      Chronic back pain [M54.9, G89.29] 12/21/2012     Mri in mid scape 1/214  Multilevel degenerative disc disease of Ronnie lumbar spine. Circumferential disc bulge at all levels below and including L1-2. No significant central spinal canal stenosis or neuroforaminal narrowing.  2. Mild retrolisthesis of L5 on S1.            B12 deficiency [E53.8] 12/21/2012     Borderline low 5 /21/2014      Chronic renal insufficiency, stage III (moderate) ([place]) [N18.30] 12/21/2012     5/ 2013      Hyperlipidemia [E78.5] 09/26/2012    Cervicalgia [M54.2] 03/11/2010     Radiofrequency ablation of c3 to c 7        Pain Tracker Scores - reviewed.    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    Idiopathic peripheral neuropathy 03/15/2015    Spondylosis of cervical region without myelopathy or radiculopathy 05/28/2014    Back pain, lumbosacral 02/15/2013    B12 deficiency 12/21/2012    Borderline low 5 /21/2014    Chronic renal  insufficiency, stage III (moderate) ([place]) 12/21/2012  5/ 2013    Hyperlipidemia 09/26/2012    Cervicalgia 03/11/2010    Radiofrequency ablation of c3 to c 7    Allergic rhinitis due to allergen     Carotid Sinus Hypersensitivity     Chronic pain     chronic LUE pain had injection.    Disc disorder of lumbosacral region     Hip injury     HYPERTENSION      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[facility])      Past Surgical History:   Procedure Laterality Date    L meniciscus Left 07/2017    by Dr Matthew Saras , re did left menisicus     PR ANES; COLONOSCOPY  2002    repeat in 3 years    PR ANES; COLONOSCOPY & POLYPECTOMY  11/18/2007    repeat in 3 years    PR CORRECTION HAMMERTOE Right 05/13/2022    Dr. Wilmon Arms, DPM    PR HALLUX RIGIDUS W/CHEILECTOMY 1ST MP JT W/O IMPLT Right 10/21/2017    Dr. Megan Salon    PR UNLISTED PROCEDURE FEMUR/KNEE      PR UNLISTED PROCEDURE HANDS/FINGERS      PR UNLISTED PROCEDURE SPINE  2013    rfa to c3 to c 7     SURGICAL HX OTHER Right 12/03/2020    RIGHT foot WOUND INCISION AND DRAINAGE    TOE SURGERY Right 02/09/2019    toe surgery  Right 01/12/2019    Dr Wilmon Arms      Family History       Problem (# of Occurrences) Relation (Name,Age of Onset)    Heart (other) (1) Mother: MI    Other Family Hx (1) Other: myelodysplasia    Colon Cancer (1) Father    Suicide Attempt (1) Brother: died of suicide           Negative family history of: Cancer          Social History     Tobacco Use    Smoking status: Never    Smokeless tobacco: Never    Tobacco comments:     Sometimes chews, but is trying to quit. 11/28/21   Substance Use Topics    Alcohol use: Yes     Alcohol/week: 7.0 standard drinks of alcohol     Types: 7 Glasses of wine per week     Comment: goal to get down to 2 per day     Current Outpatient Medications   Medication Sig Dispense Refill    Acetaminophen 500 MG Oral Tab Take 2 tablets (1,000 mg) by mouth 2 times a day.      Alpha-Lipoic Acid 300 MG tablet Take 1 tablet by mouth every morning.  Benfotiamine- Alpha Lipoic acid      amLODIPine 5 MG tablet Take 1 tablet (5 mg) by mouth 2 times a day. 180 tablet 3    atorvastatin 10 MG tablet Take 1 tablet (10 mg) by mouth daily. (Mason taking differently: Take 1 tablet (10 mg) by mouth every morning.) 90 tablet 3    BENFOTIAMINE OR Take 250 mg by mouth every morning.      betamethasone dipropionate 0.05 % lotion APPLY TO SCALP AND EARS TOPICALLY 2 TIMES A DAY AS NEEDED FOR ITCHING. Notify MD of any signs of infection or skin thinning 60 mL 5    Cholecalciferol (VITAMIN D3) 2000 units Oral Cap Take 1 capsule (2,000 Units) by mouth daily. For low level (Mason taking differently: Take 1 capsule (2,000 units)  by mouth every morning. For low level) 1 capsule 1    clindamycin 1 % gel Apply 1 application  topically 2 times a day as needed. Apply to back.      CLINDAMYCIN HCL OR Take by mouth. CLINDAMYCIN CREAM      Cyanocobalamin 1000 MCG Oral Tab one per day over-Ronnie-counter started 5/21 /2012 this level borderline low (Mason taking differently: Take 1 tablet (1,000 mcg) by mouth every morning.) 1 Tab 1    diazePAM 2 MG tablet Take 1 tablet (2 mg) by mouth daily as needed for anxiety. Take tab 30 minutes before check in. Repeat dose if needed at check in 2 tablet 0    diclofenac 1 % gel Apply 4 g topically 4 times a day. Apply to 2 gram four times daily to neck, trapezius muscle, and low back. Use a max of 32 grams a day. (Mason taking differently: Apply 4 g topically 4 times a day as needed. Apply to 2 gram four times daily to neck, trapezius muscle, and low back. Use a max of 32 grams a day.) 300 g 4    DULoxetine 60 MG DR capsule Take 1 capsule (60 mg) by mouth 2 times a day. 180 capsule 2    EPINEPHrine 0.3 MG/0.3ML auto-injector INJECT INTRAMUSCULARLY ONCE AS NEEDED FOR SEVERE ALLERGIC REACTION 4 each 3    ibuprofen 200 MG tablet Take 1 tablet (200 mg) by mouth daily as needed (pain).      lidocaine 5 % patch Apply 1 patch onto Ronnie skin daily. Apply to  painful area for up to 12 hours in a 24 hour period. (Mason taking differently: Apply 1 patch onto Ronnie skin daily as needed. Apply to painful area for up to 12 hours in a 24 hour period.) 30 patch 2    lisinopril 20 MG tablet Take 1 tablet (20 mg) by mouth 2 times a day. 180 tablet 3    mirabegron ER (Myrbetriq) 50 MG 24 hr tablet Take 1 tablet (50 mg) by mouth daily. 30 tablet 12    naltrexone 50 MG tablet TAKE 1 TABLET BY MOUTH DAILY WITH FOOD 30 tablet 11    Omega-3 Fatty Acids (OMEGA 3 OR) Take 1 tablet by mouth every morning.      pregabalin 300 MG capsule TAKE 1 CAPSULE BY MOUTH TWICE DAILY 180 capsule 0    primidone 50 MG tablet Take 1 tablet (50 mg) by mouth 2 times a day. 180 tablet 4     No current facility-administered medications for this visit.      Review of Mason's allergies indicates:  Allergies   Allergen Reactions    Adhesives Skin: Rash     Glue on lido patches    Bee Venom Skin: Hives, Skin: Itching and Swelling    Gabapentin Other     Flu like symptoms, diarrhea, body ache upset stomach    Methocarbamol Other     Mason's wife reports cognitive difficulties ("goofy")       Ronnie past medical, surgical, family, and social histories were reviewed and updated personally with Ronnie Mason.  Medications and allergies were also reviewed and confirmed personally.  Ronnie Mason reports no other change in past medical history, past surgical history, family history, or social history since last visit except as noted above.      Review of Systems:  Ronnie Review of Systems was provided by Ronnie Mason and entered in Ronnie [place] note associated with this encounter.  I did personally  reviewed and confirmed Ronnie ROS information with Ronnie Mason and have Ronnie following additional comments:   NONE.    Physical Examination:   There were no vitals filed for this visit.    General:  healthy-appearing  well-nourished  no acute distress  Mental Status:  active  normal mood  normal affect  Alert and oriented x3  Head:  normal  appearing  Cervical spine:   no pain with flexion  moderate pain with extension  moderate tenderness over Ronnie lower facet joints and paraspinous muscles to palpation  moderate pain with extension and bilateral facet loading  positive Spurling's test   Lumbar spine:   no pain with flexion  moderate pain with extension  moderate tenderness over Ronnie lower facet joints and paraspinous muscles to palpation  moderate pain with extension and bilateral facet loading  positive straight leg raise (SLR) left   Gait:  normal gait  normal station  Neuro:  Cranial nerves 2-12 grossly intact  Motor function intact  Sensation:  decreased to light touch over Ronnie left L5 and S1 dermatomes  Skin:  No obvious rashes, lesions, or erythema seen     Assessment/Discussion  No diagnosis found.    Ronnie Mason is a 72 year old Mason with left-sided cervical radiculopathy likely secondary to cervical stenosis and multilevel neuroforaminal stenosis.  Ronnie Mason has failed conservative therapies including x2 with cervical thoracic epidural steroid injections (1 week relief) in Ronnie past. His most recent right sided cervical trigger point injections have been significantly helpful for only his neck and not his radiating arm pain.     Ronnie Mason's clinical presentation appears consistent with left-sided cervical radiculopathy and lumbar radiculopathy with component of lumbar facet joint syndrome.    Regarding his cervical pain, Ronnie Mason is scheduled for an EMG of his upper extremities later today and for a neurosurgery reevaluation next week. We discussed that if Ronnie Mason ends up not being a good surgical candidate, then Ronnie Mason may be a good candidate for a cervical spinal cord stimulator trial. Ronnie Mason seems to be somewhat interested in this option.    For his lumbar pain, this appears to be multifactorial from his facet arthropathy and spinal stenosis. Ronnie Mason cannot recall undergoing a lumbar epidural steroid injection before, so this may be a good first interventional  treatment based on his results of his imaging exams.    I recommended that Ronnie Mason undergo all of his diagnostic testing and then return to help formulate a plan of treatment moving forward.    Plan:      Diagnostic evaluation:   - Obtain updated lumbar spine XR and MRI to evaluate facet arthropathy and lumbar spinal stenosis.  - Obtain EMG as previously scheduled by Dr. Humphrey Rolls (neurosurgery).    Rehabilitation:   - Continue home exercises.    Medication suggestions:   - Continue current medications as prescribed.    Procedure suggestions:   - Consider cervical SCS for cervical radiculopathy in Ronnie future.  - Consider lumbar MBBs/RFAs in Ronnie future once imaging results come back.    Follow-up:   - 2-3 weeks after neurosurgery consultation on 09/01/2022, EMG, XR, and MRI are complete.    I spent a total of 45 minutes for Ronnie Mason's care on Ronnie date of Ronnie service.  {Vanishing Tip  When performed by provider on date of visit, these count toward total time  Chart review   History taking  Physical exam  Counseling, educating Mason/family/caregiver  Orders   Referrals  and communication with other providers (not separately reported)  Documentation  Care coordination (not separately reported)  Independent interpretation of results (not separately reported) and communicating results to Mason/family/caregiver  :[phone]}        Ronnie Nephew, PA-C  Physician Assistant - [place] [facility] [place] [facility]  Anesthesiology & Pain Medicine

## 2022-09-09 ENCOUNTER — Ambulatory Visit (HOSPITAL_BASED_OUTPATIENT_CLINIC_OR_DEPARTMENT_OTHER): Payer: Medicare PPO | Admitting: Medical

## 2022-09-10 ENCOUNTER — Telehealth (INDEPENDENT_AMBULATORY_CARE_PROVIDER_SITE_OTHER): Payer: Self-pay | Admitting: Acupuncturist

## 2022-09-10 NOTE — Telephone Encounter (Signed)
Pt's name was in acupuncture waiting list and looking for appointment with Dr. Mel Almond. Dr. Mel Almond has a couple cancellation in her schedule, so I called pt's preferred phone number and left message to inform Friday, 09/11/22 @ 2 pm and 09/18/22 @ 2 pm openings. I did not hold those slots for him.

## 2022-09-11 ENCOUNTER — Ambulatory Visit: Payer: Medicare PPO | Attending: Anesthesiology | Admitting: Anesthesiology

## 2022-09-11 VITALS — BP 117/69 | HR 89 | Wt 225.0 lb

## 2022-09-11 DIAGNOSIS — M545 Low back pain, unspecified: Secondary | ICD-10-CM | POA: Insufficient documentation

## 2022-09-11 DIAGNOSIS — G8929 Other chronic pain: Secondary | ICD-10-CM | POA: Insufficient documentation

## 2022-09-11 DIAGNOSIS — M5412 Radiculopathy, cervical region: Secondary | ICD-10-CM | POA: Insufficient documentation

## 2022-09-11 DIAGNOSIS — M542 Cervicalgia: Secondary | ICD-10-CM | POA: Insufficient documentation

## 2022-09-11 DIAGNOSIS — M7918 Myalgia, other site: Secondary | ICD-10-CM | POA: Insufficient documentation

## 2022-09-11 NOTE — Progress Notes (Signed)
Review of Systems   Constitutional: Negative.    HENT: Negative.    Eyes: Negative.    Respiratory: Negative.    Cardiovascular: Negative.    Gastrointestinal: Negative.    Endocrine: Negative.    Genitourinary: Negative.    Musculoskeletal: Positive for arthralgias, back pain, gait problem, myalgias, neck pain and neck stiffness.   Skin: Negative.    Allergic/Immunologic: Negative.    Hematological: Negative.    Psychiatric/Behavioral: Negative.

## 2022-09-11 NOTE — Progress Notes (Signed)
I saw and evaluated the patient with Lilia Pro, who conducted the initial history.  I reviewed and confirmed the history in detail with Ronnie Mason in person.   I was present for the examination and formulation portions of the encounter. I agree with the findings and the plan of care as documented in our notes. I did edit the trainee's note.    I discussed the patient with Dr. Barbette Reichmann who also reviewed imaging. If we are not successful with pain relief with repeat CESI can consider transforaminal C4-5 under CT guidance. Cervical SCS may be a challenge due to anatomy as well as insurance coverage. If we cannot get good relief with conservative measures patient may need to consider surgery. Subsequently lumbar spinal cord stimulation may be an option.      I spent a total of 50 minutes for the patient's care on the date of the service.          Ronnie Mason, [facility]  [facility] Medicine   Department of Anesthesiology and Pain Medicine

## 2022-09-11 NOTE — Progress Notes (Signed)
CHIEF COMPLAINT:   Chief Complaint   Patient presents with    Back Pain       HISTORY OF PRESENT ILLNESS:    The purpose of today's visit is interval reevaluation of left neck pain with radiation to left lateral upper arm. 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  - Obtain EMG as previously scheduled by Dr. Humphrey Rolls (neurosurgery)..     Mr. Diantonio is here for other options for his neck and low back pain. He reports that his pain remains unchanged. Per Dr Laurelyn Sickle note on 09/01/22 (neurosurgery), patient is a candidate for cervical decompression and fusion (likely C4-6 ACDF).    Low back is priority - constant sharp dull pain in left L4-S1. He feels the pain/"knot" has extended to his left hip. He endorses numbness and tingling in bilateral lateral calves and tops of feet. He rates it 4-5/10 today. Pain is worse with walking, other activities. Pain is better with using cane, stretching, pool/hot tub.    Neck pain started just over a year ago - intermittent sharp stabbing pain, bilateral but left greater than right and radiates to left elbow. He can't lift his left arm higher than 90 degrees due to pain.     He reports neither pain bothers him at night, he takes some time to get comfortable but when he sleeps, he is able to sleep well.     Started going back to the pool, doing some water walking, hot tub. He feels like it does help his pain but only temporarily.    Past cervical RFAs lasted a  long time. Cervical TPIs last 3-4 days, except the final TPI which lasted an extra week with poolwork. Low back RFAs lasted a week max.    Currently taking:  Pregabalin 342m BID  Duloxetine 668mBID  Tylenol 150063mID    Past pain treatments/diagnostic testing include (copied forward and updated from [facility] dated 04/14/2019):   Dr. LieHinda Kehr/21/2019 - LEFT L5 TFESI with 80-85% relief for about 3-4 weeks of just axial back pain.   09/15/2017 - Bilateral L5 TFESI with 50-75% relief of mixture of low back and leg pain.   05/19/2017 - RIGHT L5 TFESI with 100% relief of Radicular symptoms for about 3 months  Dr. DalQuita Skyenjections in our clinic have included (exluding MBB and trigger point injections):  08/12/2016 - L5-S1 ILESI  07/01/2016 - Ultrasound guided right trochanteric bursa injection   05/20/2016 - Bilateral shoulder injections  05/13/2016 - Right L3 L4 L5/S1 RFA  02/19/2016 -  Left L3, L4, L5/S1 RFA  12/27/2012 - L5-S1 IL ESI  08/31/2012 - Left L5 TFESI  03/30/2012 - Left L4/5, L5/S1 facet inj  02/10/2012 - Left C6 C7 MB RFA  07/03/2011 - Left L3, L4, L5/S1 RFA  05/12/2011 - Left C4, C5, C6 RFA    Labs/Imaging Reviewed:    MRI L Spine wo Contrast 09/02/22  IMPRESSION  Compared to the MRI of 07/11/2019:  1.  Progression of multilevel degenerative change resulting in moderate L3-L4 spinal canal stenosis with crowding of the cauda equina nerve roots.  2.  Moderate left L3-L4 and right L5-S1 foraminal narrowing.MRI L Spine wo Contrast 09/02/22    XR L Spine 4+ View 08/25/22  FINDINGS AND  IMPRESSION  Straightening with mild dextrocurvature.     Vertebral body heights are maintained.     Similar appearance of moderate to advanced multilevel degenerative change most significant at L3-L4 through L5-S1 as evidenced by disc space narrowing, endplate spurring and facet degeneration.     Overlying soft tissues are grossly unremarkable.    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 The findings were chronic in nature in the extremity. There was acute denervation seen only in the cervical paraspinals in the mid and lower region.   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   There is NO electrodiagnostic evidence of left ulnar neuropathy at the elbow. "           REVIEW OF SYSTEMS:  The review of systems documented by our clinic staff was provided by the patient. I did review the ROS and have no further detail to add.    PHYSICAL EXAMINATION:  BP 117/69   Pulse 89   Wt (!) 102.1 kg (225 lb)   SpO2 95%   BMI 27.39 kg/m    Constitutional:Constitutional: well-appearing, in no distress, and cooperative   Skin, exposed parts: color normal, vascularity normal, no evidence of bleeding or bruising  Eyes: Negative for miosis bilaterally. Pupils appropriate for room lighting. Extraocular movements are intact bilaterally  Cardiovascular: No cyanosis, no dilation of jugular veins, no dyspnea. Bilateral radial pulses are intact and regular   Respiratory: Respirations are unlabored. No accessory muscle use noted  Gastrointestinal : Abdomen is soft, non-tender.  Neurologic: awake, alert. No focal deficits on gross exam  Musculoskeletal: strength 5/5 in bilateral upper extremities. Limited abduction of left arm to ~90 degrees  Psychiatric: Judgement/insight are normal. Mood is normal. Affect is normal. Patient is oriented to person, place and time    IMPRESSION:   Ronnie Mason is a 72 year old male with   1. Progressive left neck pain with radiation to left lateral upper arm.  Exam and EMG consistent with C5 and C6 radiculopathy. Evaluated by Dr. Humphrey Rolls on 09/01/22 and determined to be likely candidate for C4-6 ACDF   2. Axial low back pain 2/2 facet arthropathy with improvement after L4-5, L5-S1 RFA. Additional  components of myofascial pain syndrome and central sensitization, stable  3. 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  4. Prior treatment failures including Short duration of benefit for interlaminar lumbar epidural steroids and TFESI, MBB, memantine, Naltrexone.   5. 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  Mr. Drennon had no further questions.  Rehabilitation: Continue to stay active in the pool    Medication suggestions:   Continue pregabalin 378m BID  Continue duloxetine 665mBID  Continue lidocaine patch    Procedure suggestions:   Plan for interlaminar epidural steroid injection at C6-C7 with dexamethasone  If not successful can consider CT-guided C4-5 transforaminal     Caudal epidural may be appropriate in the future for lower back and leg symptoms    Will discuss possibility of spinal cord stimulation with colleagues    Continue with acupuncture    Follow-up: For procedure

## 2022-09-28 ENCOUNTER — Telehealth (HOSPITAL_BASED_OUTPATIENT_CLINIC_OR_DEPARTMENT_OTHER): Payer: Self-pay

## 2022-09-28 NOTE — Telephone Encounter (Signed)
Patient called clinic to inquire about having injections for pain in his left hip which is also causing pain to left leg. He wants to be seen for this asap, he is scheduled to see Dr. Lisbeth Ply in may for something else. Please advise.      Ronnie Mason,PSS  Routing to Nursing

## 2022-09-28 NOTE — Telephone Encounter (Signed)
I called pt to offer appt for 3/7 w/Dr. Lisbeth Ply. Slot is on hold for 1PM

## 2022-10-01 ENCOUNTER — Ambulatory Visit: Payer: Medicare PPO | Attending: Neurology | Admitting: Neurology

## 2022-10-01 ENCOUNTER — Encounter (HOSPITAL_BASED_OUTPATIENT_CLINIC_OR_DEPARTMENT_OTHER): Payer: Self-pay

## 2022-10-01 ENCOUNTER — Ambulatory Visit: Payer: Medicare PPO | Attending: Clinical Neuropsychologist | Admitting: Clinical Neuropsychologist

## 2022-10-01 ENCOUNTER — Encounter (HOSPITAL_BASED_OUTPATIENT_CLINIC_OR_DEPARTMENT_OTHER): Payer: Self-pay | Admitting: Neurology

## 2022-10-01 VITALS — BP 111/81 | HR 83 | Temp 98.4°F | Resp 18 | Wt 223.0 lb

## 2022-10-01 DIAGNOSIS — G609 Hereditary and idiopathic neuropathy, unspecified: Secondary | ICD-10-CM | POA: Insufficient documentation

## 2022-10-01 DIAGNOSIS — M545 Low back pain, unspecified: Secondary | ICD-10-CM | POA: Insufficient documentation

## 2022-10-01 DIAGNOSIS — G8929 Other chronic pain: Secondary | ICD-10-CM

## 2022-10-01 DIAGNOSIS — I6789 Other cerebrovascular disease: Secondary | ICD-10-CM | POA: Insufficient documentation

## 2022-10-01 DIAGNOSIS — G910 Communicating hydrocephalus: Secondary | ICD-10-CM | POA: Insufficient documentation

## 2022-10-01 DIAGNOSIS — G9389 Other specified disorders of brain: Secondary | ICD-10-CM | POA: Insufficient documentation

## 2022-10-01 DIAGNOSIS — N3946 Mixed incontinence: Secondary | ICD-10-CM | POA: Insufficient documentation

## 2022-10-01 DIAGNOSIS — I6930 Unspecified sequelae of cerebral infarction: Secondary | ICD-10-CM

## 2022-10-01 DIAGNOSIS — G3184 Mild cognitive impairment, so stated: Secondary | ICD-10-CM | POA: Insufficient documentation

## 2022-10-01 DIAGNOSIS — I999 Unspecified disorder of circulatory system: Secondary | ICD-10-CM

## 2022-10-01 DIAGNOSIS — F0670 Mild neurocognitive disorder due to known physiological condition without behavioral disturbance: Secondary | ICD-10-CM

## 2022-10-01 NOTE — Progress Notes (Signed)
Note: Parts of this document have been created with voice recognition software. Although I have proofread the note, transcription errors may still exist.     Adult and Transitional Hydrocephalus and CSF Disorders    Reason for Visit:  Ronnie Mason is a 72 year old male who is here for a follow up visit for the purpose of reviewing his neuropsychology testing and his MRI scans.  I last saw this patient on 04/07/2022.  He is here with his wife.  I have reviewed the prior chart notes on this patient.  Prior to the visit, Dr. Evorn Gong and I reviewed the results of the testing in the context of the previous testing, imaging, and history to formulate our interpretation and recommendations.    History of Present Illness: He first came to see me in July 2017.  He had been hospitalized 3 months earlier for a spontaneous left thalamic intraparenchymal hemorrhage.  He had also fallen and had a mild to moderate traumatic brain injury.  Enlargement of the ventricles was seen.  He had longstanding issues with his gait.  We had neuropsychology testing done by Dr. Evorn Gong and we concluded that most of the difficulty was due to the thalamic hemorrhage rather than from hydrocephalus.  He did not see me between 2018 and 2023.    He came back to see me in September 2023.  His gait and balance difficulty was mainly from low back pain, foot pain, and longstanding neuropathy.  He had no bladder control issues.  His wife was concerned about his cognition although he felt that he was not having any difficulty.  On exam we noted that the MoCA score was 24/30, which was 2 points lower than the score in 2018.  The gait had modest impairment with a Tinetti score 22/28 but I felt it was more consistent with his back pain and neuropathy and did not have features of a higher level neurologic gait disorder as would be expected from hydrocephalus.  His gait velocity was between 1 and 1.1 m/s.  We recommended a new MRI and neuropsychology testing.  He has  had these done.    Interval changes since the last visit:      Gait: He is having a significant amount of pain that is affecting his walking and balance.    Bladder: He is bladder he is better compared to the last visit.  He has been seen by a urologist.    Cognition: There to them still have some differences of opinion regarding his cognition.  Based on the responses to the [facility], he is independent.  He is not on any narcotics.  On the other hand, he is using alcohol and sometimes uses marijuana.    I personally reviewed and confirmed the past medical history and past surgical history in the record with the patient today.  He tells [place] that his pain doctors are giving consideration to a spinal cord stimulator.    Copies of the patient's complete medical history form and other documentation that I have reviewed, annotated, or created for this visit have been scanned into the electronic medical record.    Medications  Outpatient Medications Marked as Taking for the 10/01/22 encounter (Office Visit) with Michae Kava, MD   Medication Sig Dispense Refill    Acetaminophen 500 MG Oral Tab Take 2 tablets (1,000 mg) by mouth 2 times a day.      Alpha-Lipoic Acid 300 MG tablet Take 1 tablet by mouth  every morning. Benfotiamine- Alpha Lipoic acid      amLODIPine 5 MG tablet Take 1 tablet (5 mg) by mouth 2 times a day. 180 tablet 3    atorvastatin 10 MG tablet Take 1 tablet (10 mg) by mouth daily. (Patient taking differently: Take 1 tablet (10 mg) by mouth every morning.) 90 tablet 3    BENFOTIAMINE OR Take 250 mg by mouth every morning.      betamethasone dipropionate 0.05 % lotion APPLY TO SCALP AND EARS TOPICALLY 2 TIMES A DAY AS NEEDED FOR ITCHING. Notify MD of any signs of infection or skin thinning 60 mL 5    Cholecalciferol (VITAMIN D3) 2000 units Oral Cap Take 1 capsule (2,000 Units) by mouth daily. For low level (Patient taking differently: Take 1 capsule (2,000 units) by mouth every  morning. For low level) 1 capsule 1    CLINDAMYCIN HCL OR Take by mouth. CLINDAMYCIN CREAM      Cyanocobalamin 1000 MCG Oral Tab one per day over-the-counter started 5/21 /2012 this level borderline low (Patient taking differently: Take 1 tablet (1,000 mcg) by mouth every morning.) 1 Tab 1    diclofenac 1 % gel Apply 4 g topically 4 times a day. Apply to 2 gram four times daily to neck, trapezius muscle, and low back. Use a max of 32 grams a day. (Patient taking differently: Apply 4 g topically 4 times a day as needed. Apply to 2 gram four times daily to neck, trapezius muscle, and low back. Use a max of 32 grams a day.) 300 g 4    DULoxetine 60 MG DR capsule Take 1 capsule (60 mg) by mouth 2 times a day. 180 capsule 2    EPINEPHrine 0.3 MG/0.3ML auto-injector INJECT INTRAMUSCULARLY ONCE AS NEEDED FOR SEVERE ALLERGIC REACTION 4 each 3    ibuprofen 200 MG tablet Take 1 tablet (200 mg) by mouth daily as needed (pain).      lidocaine 5 % patch Apply 1 patch onto the skin daily. Apply to painful area for up to 12 hours in a 24 hour period. (Patient taking differently: Apply 1 patch onto the skin daily as needed. Apply to painful area for up to 12 hours in a 24 hour period.) 30 patch 2    lisinopril 20 MG tablet Take 1 tablet (20 mg) by mouth 2 times a day. 180 tablet 3    mirabegron ER (Myrbetriq) 50 MG 24 hr tablet Take 1 tablet (50 mg) by mouth daily. 30 tablet 12    naltrexone 50 MG tablet TAKE 1 TABLET BY MOUTH DAILY WITH FOOD 30 tablet 11    Omega-3 Fatty Acids (OMEGA 3 OR) Take 1 tablet by mouth every morning.      pregabalin 300 MG capsule TAKE 1 CAPSULE BY MOUTH TWICE DAILY 180 capsule 0    primidone 50 MG tablet Take 1 tablet (50 mg) by mouth 2 times a day. 180 tablet 4     Allergies  Adhesives, Bee venom, Gabapentin, and Methocarbamol    Major Findings:  On examination today, this is a pleasant 72 year old-year-old male.      Vital signs:  BP 111/81   Pulse 83   Temp 36.9 C (Temporal)   Resp 18   Wt (!)  101.2 kg (223 lb)   SpO2 93%   BMI 27.14 kg/m     All of the visit today was spent in review of the neuropsychology test results and counseling.  Please see Dr.  Dasher's note for more details.  Briefly, the performance on the battery is mostly stable in comparison to the testing 6 years ago.  From this, we can conclude that there is nothing to suggest progressive dementia and certainly nothing to suggest Alzheimer dementia.  On the other hand, he had some areas of impairment that were stable.  He has difficulty with list-learning memory and contextual memory, and benefits from cueing.  This is a frontal subcortical pattern.  He has slow performance on both letter fluency and category fluency.  He had more difficulty on timed tasks than untimed tasks.  He performed better on trails B.    Assessment: Dr. Evorn Gong and I explained our thoughts to him and his wife.  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  We find that the neuropsychology test performance is overall stable compared to 6 years ago, as described above.  We had discussions about brain health.  We encouraged him to be mentally, physically, and socially active to the extent that he can.  We showed him the brain imaging and describes that there are changes to the brain representing the cumulative effects of cardiovascular risk factors on the brain, and these can contribute to cognitive impairment.  When we consider controllable or reversible factors that could be impairing his cognition, we identified alcohol use and marijuana use.  These clearly will make him worse.  He is not on narcotics.  We also identified that having chronic severe pain will affect his performance on neurocognitive testing as well as his memory and cognition in the home environment.    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  Part of our hope is that by this time his pain will be under better control.  He asked about spinal cord stimulation.  I told him that this can be quite effective, and if he is a candidate for this treatment, I would recommend that he follow through with it.  Depending on how he feels and how he looks on exam next year, we will consider whether we want to repeat the neuropsychology testing in another year after that.     Legrand Como A. Jimmye Norman, MD  Neurologist  Professor of Neurology and Neurological Surgery  Director, Adult and Transitional Hydrocephalus and CSF Disorders    Billing Attestation  I spent a total of 85 minutes for the patient's care on the date of the service.

## 2022-10-02 NOTE — Progress Notes (Signed)
[facility]  Department of Rehabilitation Medicine   Neuropsychological Evaluation Summary Report       Patient Ronnie Mason, Ronnie Mason Referred By Dulcy Fanny, MD   Age 72 MRN [id number]   Date of Birth [birth year] Handedness Right   Education 14 Sex Male     Dates of Evaluation:  08/26/2022 - Interview  08/26/2022 - Psychometric Testing & Scoring (Billed separately by technician under CPT Codes [phone] & [phone])  10/30/2022 - Feedback    Services Provided Code Duration (Hours) Units   Clinical Interview:  Neurobehavioral Status Examination [id number] (95) 1 1   Evaluation Services:  Integration of patient data, interpretation of standardized test results and clinical data, clinical decision making, treatment planning, report, and feedback. [id number] 1 1    [id number] 6 6     REASON FOR REFERRAL:  The patient is a 72 year old, right-handed, married male with 16 years of education. He was referred by Dr. Jimmye Norman for a neuropsychological re-evaluation due to reported cognitive declines since his 2018 evaluation in the context of a history of complicated brain injury (TBI), left thalamic stroke, and hydrocephalus. The information in this report was obtained from a clinical interview with the patient and his wife. His available medical records were also reviewed.     HISTORY OF PRESENTING CONDITION:  The patient was in his usual state of health until 11/30/2015, when he was found down outside of his house on his driveway. He was taken to [facility] ED with symptoms of right-sided paralysis, which resolved shortly after arrival. He underwent neuroimaging that revealed enlarged ventricles, a left thalamic intraparenchymal hemorrhage, and a right parafalcine subdural hematoma, the latter of which was the result of his fall. 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 He was admitted to [facility] for inpatient rehabilitation (IPR)  and discharged home on 12/10/15. He then received intermittent supervision from his wife and started home PT, OT, and SLP. Has participated well with the therapies, and then transitioned to outpatient therapies at [facility], which were completed in September 2017.     Given the incidental discovery of his enlarged ventricles, he was subsequently referred to Dr. Jimmye Norman in the [facility], who saw him 02/21/16 and 04/02/16. At the time of those appointments, the patient denied any urinary symptoms as well as minimal gait problems (with his relative difficulty walking determined to be due to low back problems. His memory difficulties following the stroke and the head injury were also thought to be getting better. His neurological examination was notable for an improvement in his MoCA score since July 2017 (from 20 to 26/30). Dr. 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 However, given his risk factors for cognitive impairment (i.e., cerebral microvascular disease and hydrocephalus), he was referred for baseline neuropsychological testing, which was completed by me on 02/24/2017.     Results of testing indicated his intellectual abilities were in the average range, concordant to his premorbid estimate, and across domains of basic attention, visuospatial ability, and visual memory, he performed to this expectation. However, he exhibited a neuropsychological profile indicative of a lateralized pattern of deficits across more language-oriented tasks (e.g., verbal learning and memory, confrontation naming/recall, and several tasks of executive functioning), with relatively better performances in areas of spatial sequencing and visual reasoning. He also exhibited weaknesses on tests of complex and sustained  attention, but again, more predominantly when attending to auditory information versus visual  stimuli. His memory profile also indicated that his general ability to retain new information was largely intact, but he exhibited relatively better encoding efficiency for visual versus verbal content. So together, I determined that his neuropsychological profile suggested that the primary etiology of his cognitive difficulties was the result of his left thalamic stroke, as evidenced by his lateralized cognitive and motor abilities. However, the comorbid effects of hydrocephalus and cerebrovascular disease also could not be entirely ruled out. 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    INTERVAL HISTORY:  Since his 2018 neuropsychological evaluation, the patient continued to follow-up with providers largely for pain management, via behavioral medicine with Dr. Javier Docker and PT in 2021 and 2022. Unfortunately, he continued to struggle with pain the point of self-medicating with more excessive use of alcohol for pain control (with his highest being 784m hard alcohol per day). He eventually established psychiatric care with Dr. INash Mantisfor pain-focused CBT and treatment of substance use disorder (SUD) and has reduced his drinking significantly since that time but still struggled with pain control. He has since undergone several trigger point injections for myofascial pain, chronic low back pain, and chronic pain syndrome. Review of his most recent follow-[facility] on 08/25/2022 indicated the following as he considers further treatment options:    The patient has failed conservative therapies including x2 with cervical thoracic epidural steroid injections (1 week relief) in the past. His most recent right sided cervical trigger point injections have been significantly helpful for only his neck and not his radiating arm pain. The patient's clinical presentation appears consistent with left-sided cervical radiculopathy and lumbar radiculopathy with component of  lumbar facet joint syndrome. Regarding his cervical pain, he is scheduled for an EMG of his upper extremities later today and for a neurosurgery reevaluation next week. We discussed that if he ends up not being a good surgical candidate, then he may be a good candidate for a cervical spinal cord stimulator trial. He seems to be somewhat interested in this option. For his lumbar pain, this appears to be multifactorial from his facet arthropathy and spinal stenosis. He cannot recall undergoing a lumbar epidural steroid injection before, so this may be a good first interventional treatment based on the results of his imaging exams.    While the patient has been active in receiving medical care of his pain, he was unfortunately largely lost to follow-up from a neurological standpoint of his hydrocephalus and cognition until he follow-up with Dr. WJimmye Normanagain on 04/27/2022. Review of that appointment indicated that his gait and balance were his main complaints due to the combination of low back pain, right foot pain, and longstanding neuropathy. He also reported new bladder control issues since 2018 with difficulty inhibiting bladder emptying at times during the day. He also occasional enuresis. However, while he and his wife agreed on his physical symptoms, they disagreed markedly about his cognitive health. His wife expressed concern that his memory was worse and that he was frequently forgetting recent conversations (noting she often needed to repeat statements to him) and that he was also frequently misplacing things. In contrast, he did not feel he was having any notable problems with memory and he remained functionally independent as well. He feels that he does not have any difficulty. Dr. WJimmye Normancompleted an updated neurological examination, which was essentially normal as his gait pattern appeared more  consistent with neuropathy than hydrocephalus. He added that the new incontinent symptoms are also likely the  result of mixed causes, such as his neuropathy and possible prostatic enlargement. As such, to better understand whether there has been any change in his cognition, particularly as it may relate to hydrocephalus or other factors (such as his remote stroke and cerebrovascular disease), he was referred for updated neuroimaging and a neuropsychological re-evaluation to assist with this differential. He also urged the patient to follow-up further with urology.    NEUROPSYCHOLOGICAL INTAKE:   During his neuropsychological intake (08/26/2022) the patient was accompanied by his wife who assisted with a portion of the interview. Both confirmed the sequalae of symptoms noted to Dr. Jimmye Norman at their last appointment (see above). The patient continued to feel that his cognitive abilities are largely fine. However, his wife reported that over the past couple of years, she has noticed he has become increasingly forgetful of recent conversations, repeats himself and questions often (which she has found especially frustrating), is frequently losing things around the house, that his thinking speed, "is slower than molasses." She noted that these changes have been gradually worsening without any cause as his cognition had initially been improving after his stroke until these changes started occurring over the past 2 years.     Regarding his physical symptoms, the patient stated that he is constantly in pain, particularly in his back, neck, and lower extremities. When his pain is particularly bad, it can impact his balance and make him feel more unsteady, but he denied any recent falls. He will often ambulate with a cane around the house as the pain makes it very difficult for him to walk on uneven ground. He noted that he still experienced some right sided weakness since his stroke but has comparatively recovered significantly since his last evaluation. Functionally, he and his wife stated that he can manage his ADLs and most IADLs  but his wife manages the family finances and he continues to not drive. He described his general activity level as poor given how much pain he is in. When he feels okay, he tries to exercise in the pool but even this can cause significant neck pain. He finds it very difficult to ride a stationary bike or lift weights because of his pain and associated balance issues.      Regarding his recent emotional symptoms, he stated that he can be easily frustrated and is often very irritable depending on how much pain he is in. His frustration also stems from how functionally limiting his pain has been on his life as he is unable to enjoy activities like he used to. He otherwise denied generalized anxiety, mania, psychosis, or thoughts of suicide or self-harm. He has not engaged in any therapy since CBT for pain in 2022 but continues to manage his frustration medically via duloxetine. Regarding sleep, he described his sleep as "pretty" and gets on average 8-9 daily hours of sleep total, though he variably experiences some issues with sleep maintenance due to pain or needing to use the bathroom. He will experience occasional muscle spasms that interfere with sleep maintenance. He was scheduled to undergo an at home sleep study in 2018 but never followed-up after the initial visit. His wife noted that he does occasionally snore but she has not observed any unusual movements in his sleep.      REMAINING MEDICAL HISTORY:  For more comprehensive information, please refer to his medical chart but to summarize, the patient's  medical history also includes mild neurocognitive disorder, communicating hydrocephalus, chronic pain, chronic lumbar radiculopathy, idiopathic peripheral neuropathy, hyperlipidemia, hypertension, and stage 3 chronic renal insufficiency. He otherwise denied any history of seizures, head injuries prior to or since his 2017 event (see above) or known exposure to toxins. His family history is negative for  neurodegenerative disease but includes myocardial infarction in his mother, a blood disorder in his father, and a completed suicide by his brother.      RECENT NEUROIMAGING (per chart):  Brain MRI (08/22/2022)  Slight interval enlargement of moderate ventriculomegaly affecting the lateral, third and fourth ventricles in keeping with history of hydrocephalus. Narrowing of the proximal cerebral aqueduct as before which remains patent.  Mild progression of cerebral volume loss and chronic small vessel disease.  No acute infarct, hemorrhage, or mass effect.    CURRENT MEDICATIONS (per chart):  Myrbetriq '50mg'$   Pregabalin '300mg'$   Naltrexone '50mg'$   Lisinopril '20mg'$   Primidone '50mg'$   Amlodipine '5mg'$   Atorvastatin '10mg'$   Duloxetine '60mg'$   Diclofenac 1% Gel  Lidocaine 5% Patch    PSYCHIATRIC & SUBSTANCE USE HISTORY:  See Clinical Intake for current mood. In addition to his more recent symptoms, prior to his 2018 evaluation and the manifestation of much of pain, he alluded to some more long-standing emotional difficulties but would not elaborate further, noting that they have been managed. Aside from therapy for his pain, he denied any history of psychotherapy as well as any remote thoughts of suicide or self-harm. Regarding substance use, he has a diagnosis of substance use disorder (SUD). While he abstained from drinking immediately after his head injury, before and years after, he would drink on average 1-2 740m of hard alcohol per week and at one point, was up to a 7587mbottle of hard alcohol per day. He stated that it was largely for pain management but after working with psychiatry, his use reduced and he is now on average drinking 3-4 bottles of wine per week. Over the past few years, he has also started using CBD/THC about twice per month for pain management, with his last use being over 3 weeks ago. He otherwise denied any history of tobacco or illicit substance use.      PERTINANT PSYCHOSOCIAL & DEVELOPMENTAL HISTORY:  The  [place] is married [place]. [place], who has since deceased. He denied any complications surrounding his birth and has far as he knows reached all his developmental milestones appropriately. The patient's highest level of education is a bachelor's degree of the [employer] described himself as a "straight A" student and he denied any notable history of learning difficulties or disability. He is retired but previously worked as an arArboriculturist    THE FOLLOWING PROCEDURES WERE ADMINISTERED:    [employer] (ACS) - Test of Premorbid Functioning, Brief Test of Attention (BTA), CaWisconsinerbal Learning Test - 3rd Edition (CVLT-3), Controlled Oral Word Association Test (COWAT), Delis-Kaplan Executive Functioning System (D-KEFS) - Tower, GrAcupuncturistNeuropsychological Assessment Battery (NAB) - Naming Test, Patient Health Questionnaire (PHQ-9), Ruff 2&7 Selective Attention Test ([employer]&7), Test of Memory Malingering (TOMM), Trailmaking Test (Parts A & B), Wechsler Adult Intelligence Scale - 4th Edition (WAIS-IV), Wechsler Memory Scale - 4th Edition (WMS-IV) - Logical Memory, Visual Reproduction, & Symbol Span, [place] Card Sorting Test (WCST), Clinical Interview.    BEHAVIORAL OBSERVATIONS:  Neuropsychologist Observations During Intake: The  patient was on time and was accompanied by his wife. He was casually and appropriately dressed to the climate. His vision and hearing were adequate for testing purposes. His gait was somewhat slow but not shuffled or wide based, and his posture and gross motor skills were unremarkable based on casual observation. His conversational speech patterns were normal in volume, tone, and prosody. He denied any active psychosis during the intake and his affect was otherwise variable and congruent to his apparent mood and the topic at hand.      Psychometrist Observations During Testing: The patient was alert and oriented to person, time, place, and purpose of the evaluation. He appeared to put forth full effort throughout the day. At the the beginning of the day, he appeared to give up more easily on challenging tasks but was responsive to encouragement and after a lunch break, he appeared to put forth full effort without needing much additional prompts. He understood and followed directions without difficulty. He initially presented as somewhat short/irritable explaining, "I had a bad day yesterday and today will be no better." After the lunch break, however, his mood greatly improved and rapport was established and maintained. It should be noted that he did not have breakfast before coming to appointment, had only about 3 hours of sleep the night before, "I was busy. it was not due to pain. I had 4 doctor's appointments the day before, which were not easy and really wore me down." The examiner offered to bring him back for a second day of testing but he refused and persevered through the battery. Standardized administration procedures were followed on all tests, though he refused 2 remaining tasks on problem solving at the end of the day due to significant fatigue.      Test Validity: He performed adequately across all measures of performance validity.     TEST RESULTS: (see attached appendix)  ATTENTION: The patient's performance on a test of basic auditory attention and working memory was in the low average-to-average range. He also performed in the low average range on a test of visual working memory. On a test of sustained and divided auditory attention, he performed within normal limits. Regarding his visual attention, he performed in the below average range regarding scanning speed across different tasks, though made several more inattentive errors when required to sustain his focus to details that required more cognitive control (extremely low  range)     EXECUTIVE FUNCTIONING: Regarding his executive functioning, he performed in the extremely low range on a test of response inhibition, both in speed and accuracy. On tests of mental flexibility, he performed variably in the below-to-low average range based on his response accuracy (e.g., reduced speed preserved more accuracy). On untimed tasks of verbal, abstract reasoning, and deductive visuospatial reasoning, he performed respectively in the low average and high average range. Planned tests of novel problem solving and spatial planning/sequencing were omitted due to patient fatigue at the end of testing.       LEARNING AND MEMORY: On an unorganized, repeatedly presented wordlist, he performed in the low average range regarding learning efficiency, and in the below average range on long delayed free recall, recalling 3 of 7 initially learned words. His pattern of recall was notable for a substantial number of intrusion errors (extremely low range of performance). When provided with recognition cues, he performed in the below average range. On a second, organized and contextual story task, his learning efficiency and delayed recall performances were respectively in  the below average and extremely low range. He was able to freely recall 5 of 14 initially learned components of the stories, and when provided recognition cues, he performed below normal limits.      Lastly, on a test of visual memory with stimuli presented only once, his encoding and delayed free recall performances were respectively in the average and low average range. He was able to freely recall 12 of 34 initially learned visual details and when provided with recognition cues, he performed within normal limits. On a second visual memory task that required him to draw/passively encode a complex figure, he performed in the low average range on both immediate and delayed free recall trials. However, he recalled more details (10 from 8.5) after  delay, which suggested he benefitted from time to consolidate the information to memory. When provided with visual components from the original figure amidst similar appearing distractors, his recognition discriminability fell in the lower end of normal limits.     LANGUAGE SKILLS: On a test of confrontation naming, he performed in the below average range. He was able to correctly name 1 of 3 the items he missed with the aid of phonemic cues. His performance across tests of verbal fluency (semantic and letter) both fell the below average range.     VISUAL SPATIAL SKILLS: The patient's visuospatial and integrated visuoconstructional abilities were intact, in the average range.      MOTOR FUNCTIONING: On a test of fine motor dexterity, the patient demonstrated performed in the low average range with both his dominant (right) and non-dominant hand. There was no evidence of gross apraxia but he dropped the pegs twice when using his dominant hand.     EMOTIONAL FUNCTIONING: On a questionnaire of emotional distress, the patient denied experiencing any recent depressive symptoms.      SUMMARY AND IMPRESSIONS:  Ronnie Mason is a 72 year old, right-handed, married male who was referred by Dr. Jimmye Norman for a neuropsychological re-evaluation due to reported cognitive declines since his 2018 evaluation in the context of a history of complicated brain injury, left thalamic stroke, and hydrocephalus. Compared to his 2018 evaluation, Ronnie Mason's cognitive status remained largely stable in most domains, including simple attention, working memory, divided attention, visuospatial reasoning, and expressive language/naming. He also continues to exhibit more preserved visuospatial abilities compared to weaker verbal-mediated tasks, consistent with the effects of his left thalamic stroke prior to his first exam. This visual vs. verbal performance discrepancy also remained across tasks of memory with relatively weaker efficiency for verbal  tasks, though the degree of discrepancy was comparatively worse as his encoding efficiency for a large amount of verbal content at once was weaker (but was able to still consolidate/retain the limited content over time). Other comparative differences in his cognitive status were also slower with less accuracy in domains of selective, speeded attention and pace of verbal fluency (across all letter and semantic cues). He also continues to exhibit stable, but predominant deficits across timed tasks of executive functioning assessing cognitive flexibility, set-shifting/multi-tasking, and self-monitoring. Repeated tests of upper motor functioning indicate a lesser degree of lateralization, which suggests improved fine motor dexterity since his stroke. The patient also continues to deny any significant degree of depressive symptoms or emotional distress that would be consistent with a neurocognitive disorder.         Taken together, Ronnie Mason continues to meet criteria for a mild neurocognitive disorder. From an etiological standpoint, the more prominent causes remain residual effects of his left thalamic stroke, as  evidenced by his relatively more lateralized (verbal > visual) abilities, as well as the cumulative effects of cerebrovascular disease. The impact of hydrocephalus remains more speculative. While his pattern of persisting executive deficits, particularly mental flexibility, are consistent with this cause, they also overlap with that often seen with cardiovascular disease and his type of gait difficulties are more consistent with the effects of his chronic pain rather than hydrocephalus. It also reasonable to suspect that impact of his chronic pain, the use of alcohol as a analgesic, and the negative impact it has on his sleep are also notable, secondary contributors to his cognitive difficulties. Fortunately, his profile does not indicate any prominent evidence of neurodegenerative disease, such as Alzheimer's.      These results were discussed with the patient and his wife in coordination with Dr. Jimmye Norman, with the focus predominant on the importance of brain health. He was encouraged to be more mentally, physically, and socially active to the extent that he can (see section below for further details), and his serial neuroimaging of his brain were reviewed to show the changes to his brain representing the cumulative effects of cardiovascular risk factors, and how these can contribute to cognitive decline. He was also encouraged to try and reduce his use of alcohol, given both its effects as a neurotoxin on cognition and how increasing age makes one more susceptible to its effects as well as interfere with sleep. The relationship between chronic severe pain and cognitive performance, particularly given the distracting nature of it and its secondary impact on sleep, was also reviewed as well as ways to incorporate more behavioral means to mitigate it (see below). So together, the patient was encouraged to follow-up with Dr. Jimmye Norman in approximately 1 year for re-evaluation to monitor his hydrocephalus, with the hope that by that point, his pain will be under better control. If so, he would be encouraged to undergo a neuropsychological re-evaluation to further gauge the impact of his stroke, cerebrovascular disease, and hydrocephalus independent of the confounding impact of chronic pain. I offer the following recommendations in more detail below.    ADDITIONAL RECOMMENDATIONS:    In addition to continuing to work with Dr. Blenda Nicely with medical treatment of his pain, Ronnie Mason may also find it helpful to implement a consistent daily habit in which he incorporates relaxation and mindfulness strategies to keep his baseline emotional arousal levels lower, which can help to minimize the impact of pain. Mindfulness is a scientifically supported technique that enhances top-down inhibitory control of emotions and involves focusing  attention on the present moment without judgement. As such, he may benefit from reviewing and practicing the following relaxation/mindfulness tips and strategies.    Relaxation is about achieving complete rest of the body and mind. If you feel that your brain or body is being stimulated, you are not achieving true relaxation. It can take some time to learn to 'switch off' both physically and mentally. Some people find it very difficult to relax properly and feel guilty if they are not busy or doing something 'useful.'    Make room for relaxation. Set aside a time and place to relax. You do not need to go to bed to relax and in fact it can be best to save your bed for nighttime sleep. Where you choose will depend on your home circumstances, but you need to find a place where you will not be disturbed. Switch off the phone and let those around you know that you do not want to  be interrupted. Get yourself comfortable, either lying down on a mat, or sitting in a chair with your neck, feet and arms well supported. Make sure you are warm enough.    Practice good breathing. Learn techniques for good breathing, and remembering to put them into practice, is important. When you are feeling stressed, anxious, or worried, your breathing can be shallow and quick. This is called hyperventilation. When you hyperventilate you use only the upper part of your chest, whereas good breathing uses your whole chest and lung area. A lot of people are unaware that they are hyperventilating, and it can become a habit. It alters the blood chemistry and causes symptoms such as pins and needles, dizziness, palpitations, breathlessness, and chest pain, and heightens anxiety and panic. Naturally, these symptoms can cause further worry and anxiety and a vicious circle is created. To combat this, you can practice abdominal breathing, which involves two main components:    Breathing Control  Find a quiet, comfortable spot where you will not be  disturbed.  Sit in a comfortable chair or lie down if breathing from the diaphragm is difficult for you.  Breathe slowly, from the diaphragm (belly breathing).  Breathe in a smooth continuous motion. Do not hold your breath prior to exhaling. Allow your breath in to be smoothly followed by your breath out. If you become dizzy or feel faint, just stop for a little while and then try again.     Concentration  Imagine your breath as a full circle (see below). Imagine your breath in and out to be a smooth, circular process without becoming stuck or jagged (holding patterns).  Repeat the following in your mind. Inhale: "In full body." Exhale: "Out letting go."  If you notice yourself becoming distracted, gently bring your attention back to your breathing. Allow the distracting thought to pass away.  Once you start to feel comfortable with counting, you can start to slow the number of breaths.    Release muscle tension in your body. When under stress, the muscles will tense up; chronic or overly activated emotional states can lead to excess muscle tension that takes energy, contributes to fatigue, and can even lead to chronic pain. Try the following simple exercise to identify feelings of tension and relax them in the body:    Progressive Muscle Relaxation  Take a deep inhale, make a tight fist with your right hand, and hold it for a count of three. On an exhale, think the word "relax" and release all the tension in that right hand. Take a moment or two to focus on the difference in the sensations of a tense muscle versus that of a relaxed muscle.  Using the same technique, one by one, tense and relax the muscles in your face. Add tension on the inhale, then think the word "relax" as you exhale and let the muscle go slack and loose (for example, focusing on your forehead, then eyes, jaw, and cheeks)  Use this technique throughout different areas of your body, focusing on one area at a time, including your shoulders, chest  muscles, abdomen, different muscles in your arms, legs, and feet.  If tightening any area is painful, you can instead simply focus on the relaxing and loosening of tension in that area, without having to tense it up beforehand.    Alternatively, when feeling overwhelmed or aroused, try focusing your attention on something neutral in the environment that engages your senses (sight, sound, smell, taste, touch). One quick and  easy way to do this is tapping into the biological salience of temperature such as warmth or cold. For example, squeeze an ice cube in your hand or place a cold pack somewhere on the body where it is not painful or uncomfortable, and focus on the feeling of the cold and melting ice on your skin; or use a heating pad (careful not to cause any burn or damage) which can also help relax muscle tension.    Given Ronnie Mason's variable processing speed inefficiencies, he may also benefit from the use of some of the following strategies to improve his focus and to conserve his cognitive energy:   Schedule activities that require your attention at times when you feel at your best. To increase reading comprehension, try to read when you feel at your most attentive.  Minimize distractions in your environment. Reduce noisy distractions such as TV, radio or other people talking.   Allow plenty of time to complete tasks. Consider breaking work into smaller, more manageable tasks. Set time periods for tasks and schedule time for frequent breaks before continuing. For example, download a loop timer application on your cell phone to schedule 20 minutes of work, then a 5-minute break, and repeat. Research has shown that taking frequent breaks increases work efficiency, particularly in individuals with attentional difficulties.  Focus on one task at a time. Trying to multi-task may decrease efficiency.      As noted above, Ronnie Mason has several vascular factors, including hypertension, hyperlipidemia, and history of stroke,  which place him at increased risk of further cognitive decline due to cerebrovascular disease. To ensure that he does everything possible to minimize the impact on his cognitive status over time, it is recommended that he engage health-promoting behaviors such as following a healthy diet, medication adherence, good sleep hygiene, reduced alcohol use, and mental and social engagement. That said, the appropriate exercise that can protect and improve cognitive health can vary, from a brisk 30-minute walk, stretching (i.e., yoga), strength training, and jogging to swimming. The primary goal is to do something active that increases her heart rate for at least 20-30 minutes (ideally getting her heart rate to 80-90% aerobic capacity periodically) to improve stamina, cerebrovascular health, and strength. Of note, is important that Ronnie Mason not engage in any new form of exercise or activity until he is cleared by his providers first given his severe pain and understandably, more intense exercise will have to be deferred until this is properly treated. In the meantime, he is encouraged to remain as active as he can tolerate and below are also a few articles on how exercise improves cognitive health.    Effects of Physical Exercise on Cognitive Functioning and Wellbeing: Biological and Psychological Benefits  [email or web address]-carson.[email or web address]    Exercise Can Boost Your Thinking and Memory Skills: [place]  [email or web address].tn    In addition to exercise, [place] may also be interested in the texts below on improving/maximizing brain and overall cognitive health.   Dotson, Vonetta (2022) Keep Your Wits About You: The Science of Brain Maintenance as You Age.  [employer] Life [employer].  Arnetha Massy (1999) The Memory Manual: 10 Simple Things You Can Do to Improve Your Memory After 50. [employer]: [place], [place]  [place], [employer] Meda Klinefelter [phone]) Improving Your Memory: How to Remember What You're Starting to Forget. [employer] [employer]: [place], [place]  Gilchrist (2001) Total Memory Workout:  8 Easy Steps to [employer]   [place]: [place].  Leisa Lenz (1996) Your Memory: How It Works and How to Improve It. (2nd edition), [place]: [place].  Wandalee Ferdinand (2005) The [facility] Guide to Achieving Optimal Memory.  Redge Gainer:  [place].  Jamelle Rushing. (2019).  The Brain Health Book:  Using the Power of Neuroscience to Improve Your Life.  [place].  Renita Papa (2002) The Memory Bible: An Innovative Strategy for Keeping Your Brain Young.  [place]: [place].    Neuropsychological results indicated that his learning efficiency and retention was best when information was presented to him in a repeated, structured fashion and if feasible, with visual cues. Therefore, whenever faced with new information, he is encouraged to take more time to rehearse and associate it within a broader and meaningful context and when being told new information by another individual, he is encouraged to then paraphrase the information back to ensure it is not only heard correctly, but to also enhance consolidation.     Below are also several short-term prospective memory strategies that he is encouraged to incorporate into his daily routine.  One of the easiest strategies is to write a reminder on your hand so you are sure to see it.  Examples: "Call [place]," "Mail taxes," "Get milk".  Leave yourself a message on your voicemail or answering machine, so you will hear it later.  Send yourself an email with the reminder in the subject line.  Leave it "unread" so it will stand out when you look at your inbox later.  Use visual imagery.  The weirder, funnier, and more elaborate, the better.  Example: You need to call your friend Violet when you get home. Visualize a pay phone attached to your front door, with a  vase of violets on top of it. You open the front door and the violets fall, spilling dirt all over the floor. When you get home and see your front door, you are more likely to remember to call your friend Violet.  Cannot miss reminders are sort of like automatic places. They are reminders that you cannot miss seeing.  Items you need to take with you when you leave the house can be on your front doorknob or hanging in a bag on the front doorknob. You cannot turn the doorknob without seeing the bag.  A sticky note on your cell phone screen is hard to miss.  Talk to yourself about the consequences of forgetting to do something.  Examples:   "If I don't call Violet when I get home, she will think I forgot her birthday, and I would feel awful if that happened."   "If I don't mail that bill when I leave, my payment might be late and I might get a late charge."   "If I don't get the laundry out of the dryer right away, it will sit there and get wrinkled, and then I'll have to iron the clothes, and I hate ironing!"    His results suggest that his experienced word-finding difficulties are more likely due to difficulty accessing information, but not because of any loss of semantic knowledge. From a rehabilitation standpoint, he may benefit from incorporating varying linguistic strategies to help him retrieve words and information more effectively (see below).  Go Through the Alphabet:  Go through the alphabet and say the sounds of each letter and think about whether the word may start with that sound.  Visualize a Letter Association:  To remember names, associate the first letter  with the object person or place.  For example, after meeting a woman named Vanita Ingles, you took notice that she was wearing a V-necked shirt.  Whenever you see her later, remember her wearing that shirt and it triggered her name.  Use Word Associations: Associate an idea or quality with the object.  The way to remember the name of the flower impatiens is  to remember how impatient you get when trying to think of the name.   Associate a [employer]: Use a rhyming word with the object.  To remember the flower's name geranium, think of cranium - geranium.    Visual Associations:  Associate a visual when using rhyming words as combining strategies can help to assure future recall.  In the example above, cranium - geranium, one may notice and then visualize that a full geranium blossom resembles the shape of a cranium.  Use Visual Hooking Strategies - Using visual hints that lie within the name or word that one wishes to quickly recall.  A visual hooking strategy for the name Delfino Lovett might be the recognition that the word rich is in [place].  One could then visualize Richard as being very rich.   Use Auditory Hooking Strategies - Using auditory hints that lie within the name or word that one wishes to quickly recall.  An auditory hooking strategy for the vocabulary word benevolent might be that the word sounds like be not violent.  Then one can think, be not violent - be kind, and benevolent means kind in spirit.  Visualize the Word: When you cannot recall a word, use your mind's eye to see the word written on paper.      Ronnie Mason is encouraged to continue with his regular follow-ups with Dr. 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    The current neuropsychological evaluation will serve as a baseline for which any future evaluations can be compared.     Thank you for this referral and the opportunity to be involved in this wonderful man's care.     Sincerely,    Sui Kasparek A. Cleve Paolillo, Ph.D., [place]    Board Certified Clinical Neuropsychologist [phone])  Associate Professor  Department of [place] [facility]    Appendix    PROCEDURES Score Score Norms    02/24/2017 08/26/2022    BASELINE INTELLECTUAL ABILITY         ACS - Test of Premorbid Functioning 108 - (M=100, SD=15)           INTELLECTUAL ABILITY:         WAIS-IV            Index Scores             Full Scale IQ 92 - (M=100, SD=15)         General Ability 97 - (M=100, SD=15)       Verbal Comprehension Index 85 - (M=100, SD=15)         Similarities 6 7 (M=10, SD=3)         Vocabulary 9 - (M=10, SD=3)         Information 7 - (M=10, SD=3)       Perceptual Reasoning Index 109 104 (M=100, SD=15)         Block Design 11 9 (M=10, SD=3)         Matrix Reasoning 14 14 (M=10, SD=3)         Visual Puzzles 10 10 (M=10, SD=3)  Working Memory Index 92 97 (M=100, SD=15)         Digit Span 13 12 (M=10, SD=3)         Arithmetic 4 7 (M=10, SD=3)       Processing Speed Index 86 92 (M=100, SD=15)         Coding 8 8 (M=10, SD=3)         Symbol Search 7 9 (M=10, SD=3)           ATTENTION AND EXECUTIVE FUNCTIONING        WMS-IV Symbol Span 16 16 Percentile     BTA (5,8) 10-24 25-74 Percentile     Ruff 2 & 7           Auto Speed 21 7 Percentile       Auto Accuracy 19 8 Percentile       Control Speed 25 4 Percentile       Control Accuracy 7 <1 Percentile       Total Speed 27 7 Percentile       Total Accuracy 12 1 Percentile     WCST-128           Total Categories (2) 6-10 - Percentile       Trials to First Category (31) 2-5 - Percentile       Perseverative Errors (29) 13 - Percentile       Non-Perseverative Errors (26) 5 - Percentile       Failure to Maintain Set (3) 11-16 - Percentile     D-KEFS           Tower Test             Total Achievement 50 - Percentile         Time Per Move 50 - Percentile         Move Accuracy 37 - Percentile       Color-Word Interference             Combined Color-Word Reading - 25 Percentile         Inhibition (25 errors) - <1 Percentile         Inhibition/Switching (7 errors) - 25 Percentile     Trailmaking Test           Part A (35", 0 errors) 30 39 Percentile       Part B (154", 0 errors) <1 10 Percentile           LEARNING AND MEMORY         CVLT-3           Trials 1-5 (3,6,5,7,7) 19 10 Percentile       Trial B (5) 1 63 Percentile       Short Delay Free Recall (1) 5 1  Percentile       Long Delay Free Recall (3) 5 5 Percentile       Intrusions (35) 2 <1 Percentile       Repetitions (4) 63 50 Percentile       Recognition Discriminability (13 Hits; 14 FP) 9 5 Percentile     WMS-IV           Logical Memory I (14) 25 5 Percentile       Logical Memory II (5) 2 1 Percentile       Logical Memory Recognition (19/30) 26-50 3-9 Percentile       Visual Reproduction I (34) 37 63 Percentile       Visual Reproduction II (  12) 16 16 Percentile       Visual Reproduction Recognition (6/7) 26-50 >75 Percentile     RCFT          Immediate Recall (8.5) - 14 Percentile       Delayed Recall (10) - 21 Percentile       Recognition Discriminability (7 Hits; 1 FP) - 12 Percentile           LANGUAGE         NAB Naming (28) 3 7 Percentile       Semantic Cues (0/3) - - -       Phonemic Cues (1/3) - - -     COWAT (FAS) 54 5 Percentile     Semantic Fluency - Animals 19 4 Percentile           VISUOSPATIAL SKILLS         Visual Reproduction Copy (42) >75 26-50 Percentile     WAIS-IV            Block Design 63 37 Percentile         Visual Puzzles 50 50 Percentile           MOTOR FUNCTIONING         Grooved Pegboard           Dominant (Right, 99", 2 Drops) 2 19 Percentile       Non-Dominant (115", 1 Drop) 21 10 Percentile           MOOD/PERSONALITY         PHQ-9 (0) None None Descriptor   __________________________________________________________________________________________________________________________________________________________________________________________  FP = False Positive; WNL = Within Normal Limits; DC = Discontinued; ( ) = Raw Scores  *Scores converted so higher value indicates better performance    Distant Site Telemedicine Encounter (1/24 Intake Only)  I conducted this encounter via secure, live, face-to-face video conference with the patient. I reviewed the risks and benefits of telemedicine as pertinent to this visit and the patient agreed to proceed.    Provider Location: Off-site  location (home, non-[place] location)  Patient Location: At hospital room  Present with patient: Partner

## 2022-10-08 ENCOUNTER — Ambulatory Visit: Payer: Medicare PPO | Attending: Anesthesiology | Admitting: Anesthesiology

## 2022-10-08 ENCOUNTER — Encounter (HOSPITAL_BASED_OUTPATIENT_CLINIC_OR_DEPARTMENT_OTHER): Payer: Self-pay | Admitting: Anesthesiology

## 2022-10-08 VITALS — BP 136/84 | HR 83 | Temp 97.5°F | Ht 76.0 in | Wt 225.0 lb

## 2022-10-08 DIAGNOSIS — G8929 Other chronic pain: Secondary | ICD-10-CM | POA: Insufficient documentation

## 2022-10-08 DIAGNOSIS — M5412 Radiculopathy, cervical region: Secondary | ICD-10-CM | POA: Insufficient documentation

## 2022-10-08 DIAGNOSIS — M542 Cervicalgia: Secondary | ICD-10-CM | POA: Insufficient documentation

## 2022-10-08 DIAGNOSIS — M545 Low back pain, unspecified: Secondary | ICD-10-CM | POA: Insufficient documentation

## 2022-10-08 DIAGNOSIS — M47816 Spondylosis without myelopathy or radiculopathy, lumbar region: Secondary | ICD-10-CM | POA: Insufficient documentation

## 2022-10-08 DIAGNOSIS — M7632 Iliotibial band syndrome, left leg: Secondary | ICD-10-CM | POA: Insufficient documentation

## 2022-10-08 DIAGNOSIS — M4802 Spinal stenosis, cervical region: Secondary | ICD-10-CM | POA: Insufficient documentation

## 2022-10-08 MED ORDER — DICLOFENAC SODIUM 50 MG OR TBEC
50.0000 mg | DELAYED_RELEASE_TABLET | Freq: Two times a day (BID) | ORAL | 0 refills | Status: DC
Start: 2022-10-08 — End: 2022-10-13

## 2022-10-08 MED ORDER — CYCLOBENZAPRINE HCL 5 MG OR TABS
5.0000 mg | ORAL_TABLET | Freq: Three times a day (TID) | ORAL | 0 refills | Status: DC | PRN
Start: 2022-10-08 — End: 2022-10-13

## 2022-10-08 NOTE — Progress Notes (Signed)
CHIEF COMPLAINT:   Chief Complaint   Patient presents with    Neck Pain     right    Hip Pain     left    Low Back Pain     left       HISTORY OF PRESENT ILLNESS:    The purpose of today's visit is interval reevaluation of multiple pain complaints including hip pain. Recently he has been struggling with severe neck and back pain and is a candidate for cervical decompression and fusion (likely C4-6 ACDF). 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 The patient was last seen by Dr. Lisbeth Mason 09/11/22 at which time we recommended continuation of his chronic medications and planned for interlaminar epidural steroid injection at C6-C7 with dexamethasone.    Pt reports L sided hip pain and worsening neck pain    L sided Hip pain started about 2 weeks out of blue, "was not doing much," denies trauma or intense exercise regimen. Pain was sharp, constant initially 8-9/10, radiating laterally to the knee. Pain worsen over time, but has been dull in the last few days. Pt cannot walk due to the pain for 2.5 weeks before today, walks with cane now.  Pain worsen if he lays on the left side. Pt takes usual meds with tylenol and motrin without significant improvement. Heating pad, ice packs, lidocaine patches help, but he is not functional. Denies recent new shoe wear    Neck pain started a long ago, stabbing, worse than usual. Still radiating to the left arm, but intensity was about the same. Touching helps.     Pt reports still taking   pregabalin '300mg'$  BID  duloxetine '60mg'$  BID  lidocaine patch  Naltrexone       Pt also asked if it is safe for him to start on either tramadol or Flexeril for  his pain. Also started drinking alcohol more recently to help with the pain. He is still taking naltrexone    Labs/Imaging Reviewed:    None        REVIEW OF SYSTEMS:  The review of systems documented by our clinic staff was provided by the patient. I did review the ROS and have no further detail to add.    PHYSICAL EXAMINATION:  BP 136/84   Pulse 83   Temp 36.4 C (Temporal)   Ht '6\' 4"'$  (1.93 m)   Wt (!) 102.1 kg (225 lb) Comment: per patient  SpO2 95%   BMI 27.39 kg/m    Constitutional:Constitutional: well-appearing and well-nourished   Skin, exposed parts: color normal  Eyes: Negative for miosis bilaterally. Pupils appropriate for room lighting. Extraocular movements are intact bilaterally  Cardiovascular: No cyanosis, no dilation of jugular veins, no dyspnea. Bilateral radial pulses are intact and regular   Respiratory: Respirations are unlabored. No accessory muscle use noted  Neurologic: AOx3  Musculoskeletal: TTP over lateral left hip  Psychiatric: Judgement/insight are good. Mood is . Affect is normal. Patient is oriented to person, place and time    IMPRESSION:   Ronnie Mason is a 72 year old male with   1. Progressive left neck pain with radiation to left lateral upper arm.  Exam and EMG consistent with C5  and C6 radiculopathy. Evaluated by Dr. Humphrey Mason on 09/01/22 and determined to be likely candidate for C4-6 ACDF. Worsening   2. Axial low back pain 2/2 facet arthropathy with improvement after L4-5, L5-S1 RFA. Additional components of myofascial pain syndrome and central sensitization, stable  3. Right hip pain 2/2 severe OA  4. Prior treatment failures including Short duration of benefit for interlaminar lumbar epidural steroids and TFESI, MBB, memantine, Naltrexone.   5. Acute left IT band syndrome. Will start oral diclofenac   6. 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  Ronnie Mason had no further questions.    Rehabilitation:  Counseled patient about alcohol use     Medication suggestions:   Continue pregabalin '300mg'$  BID  Continue duloxetine '60mg'$  BID  Continue lidocaine patch    Take diclofenac BID a day for week for IT band - Stop ibuprofen and naproxen    Trial of cyclobenzaprine '5mg'$  daily prn for severe pain flare (counseled about side effects)    Procedure suggestions:   Plan for interlaminar epidural steroid injection at C6-C7 with dexamethasone  If not successful can consider CT-guided C4-5 transforaminal      Caudal epidural may be appropriate in the future for lower back and leg symptoms     Can consider spinal cord stimulation for lower back and leg symptoms after cervical surgery (assuming CESI or TFESI not sufficient to delay surgery further).    Continue with acupuncture     Follow-up: For procedure    I spent a total of 37 minutes for the patient's care on the date of the service.    I saw and evaluated the patient with Barbette Merino, who conducted the initial history.  I reviewed and confirmed the history in detail with Ronnie Mason in person.   I was present for the examination and formulation portions of the encounter. We completed the note jointly.      Marion Downer, [place] [facility]  [facility]   Department of Anesthesiology and Pain Medicine

## 2022-10-08 NOTE — Patient Instructions (Signed)
Take diclofenac twice a day for week - Stop ibuprofen and naproxen

## 2022-10-08 NOTE — Progress Notes (Signed)
Review of Systems   Constitutional: Negative.    HENT: Negative.    Eyes: Negative.    Respiratory: Negative.    Cardiovascular: Negative.    Gastrointestinal: Negative.    Endocrine: Negative.    Genitourinary: Negative.    Musculoskeletal: Positive for arthralgias, back pain, gait problem, myalgias, neck pain and neck stiffness.   Skin: Negative.    Allergic/Immunologic: Negative.    Hematological: Negative.    Psychiatric/Behavioral: Negative.

## 2022-10-09 ENCOUNTER — Ambulatory Visit (INDEPENDENT_AMBULATORY_CARE_PROVIDER_SITE_OTHER): Payer: Medicare PPO | Admitting: Acupuncturist

## 2022-10-09 ENCOUNTER — Other Ambulatory Visit (HOSPITAL_BASED_OUTPATIENT_CLINIC_OR_DEPARTMENT_OTHER): Payer: Self-pay | Admitting: Anesthesiology

## 2022-10-09 DIAGNOSIS — M5412 Radiculopathy, cervical region: Secondary | ICD-10-CM

## 2022-10-09 DIAGNOSIS — G8929 Other chronic pain: Secondary | ICD-10-CM

## 2022-10-09 NOTE — Progress Notes (Signed)
[place] [facility] for [place] Visit (#2 in 2024)      Referred by: No ref. provider found     PCP: Donnal Moat, MD    HPI: Ronnie Mason was referred to acupuncture and [place] for consultation and treatment of    Chief Complaint   Patient presents with    Acupuncture     Chronic Back Pain     Ronnie Mason is here for the acupuncture treatment for his chronic lower back pain and related issues (hip and neck pain). I discussed with Bre[place]on is current conditions and changes since his last visit on 08/07/2022 (two months ago).     CC1: Chronic Lower back pain  - Location: Bilateral lower back,  - Pain scale today: for LBP 4-5/10, L hip pain: 2/10  - Noted Lower back was doing slightly better until three weeks ago.   - His left hip went out three weeks ago and he was in a severer pain.   - He was not able to walk for 4 days  - Today is doing better  - He has been  using a cane since then.     Related issue: Neck pain  - Pain scale:  8/10, constat, and  has gone up to 8-9/10 during the past two weeks  - Any neck movement can trigger pain (extension, flexion, and rotation)   - Noting he has "max-ed out" the daily dosage of Tylenol and was prescribed other pain medications in addition to daily Tylenol (pregabalin '300mg'$  and duloxetine '60mg'$ )  - If Alika uses neck support pillow, he can sleep without waking up with pain  - Neck support helps for even driving.   - His doctor noted first try the epidural injection at his neck, and if not helping, then neck surgery.     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    Idiopathic peripheral neuropathy 03/15/2015    Spondylosis of cervical region without myelopathy or radiculopathy 05/28/2014    Back pain, lumbosacral 02/15/2013    B12 deficiency 12/21/2012    Borderline low 5 /21/2014    Chronic renal insufficiency, stage III (moderate) ([place]) 12/21/2012    5/ 2013    Hyperlipidemia 09/26/2012    Cervicalgia 03/11/2010    Radiofrequency ablation of c3 to c 7    Allergic rhinitis due to allergen     Carotid Sinus Hypersensitivity     Chronic pain     chronic LUE pain had injection.    Disc disorder of lumbosacral region     Hip injury     HYPERTENSION      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[facility])         Surgical History:  Past Surgical History:   Procedure Laterality Date    L meniciscus Left 07/2017    by Dr Matthew Saras , re did left menisicus     PR ANES; COLONOSCOPY  2002    repeat in 3 years    PR  ANES; COLONOSCOPY & POLYPECTOMY  11/18/2007    repeat in 3 years    PR CORRECTION HAMMERTOE Right 05/13/2022    Dr. Wilmon Arms, DPM    PR HALLUX RIGIDUS W/CHEILECTOMY 1ST MP JT W/O IMPLT Right 10/21/2017    Dr. Megan Salon    PR UNLISTED PROCEDURE FEMUR/KNEE      PR UNLISTED PROCEDURE HANDS/FINGERS      PR UNLISTED PROCEDURE SPINE  2013    rfa to c3 to c 7     SURGICAL HX OTHER Right 12/03/2020    RIGHT foot WOUND INCISION AND DRAINAGE    TOE SURGERY Right 02/09/2019    toe surgery  Right 01/12/2019    Dr Wilmon Arms         Medications:  Outpatient Medications Prior to Visit   Medication Sig Dispense Refill    Acetaminophen 500 MG Oral Tab Take 2 tablets (1,000 mg) by mouth 2 times a day.      Alpha-Lipoic Acid 300 MG tablet Take 1 tablet by mouth every morning. Benfotiamine- Alpha Lipoic acid      amLODIPine 5 MG tablet Take 1 tablet (5 mg) by mouth 2 times a day. 180 tablet 3    atorvastatin 10 MG tablet Take 1 tablet (10 mg) by mouth daily. (Patient taking differently: Take 1 tablet (10 mg) by mouth every morning.) 90  tablet 3    BENFOTIAMINE OR Take 250 mg by mouth every morning.      betamethasone dipropionate 0.05 % lotion APPLY TO SCALP AND EARS TOPICALLY 2 TIMES A DAY AS NEEDED FOR ITCHING. Notify MD of any signs of infection or skin thinning 60 mL 5    Cholecalciferol (VITAMIN D3) 2000 units Oral Cap Take 1 capsule (2,000 Units) by mouth daily. For low level (Patient taking differently: Take 1 capsule (2,000 units) by mouth every morning. For low level) 1 capsule 1    clindamycin 1 % gel Apply 1 application  topically 2 times a day as needed. Apply to back. (Patient not taking: Reported on 10/01/2022)      CLINDAMYCIN HCL OR Take by mouth. CLINDAMYCIN CREAM      Cyanocobalamin 1000 MCG Oral Tab one per day over-the-counter started 5/21 /2012 this level borderline low (Patient taking differently: Take 1 tablet (1,000 mcg) by mouth every morning.) 1 Tab 1    cyclobenzaprine 5 MG tablet Take 1 tablet (5 mg) by mouth 3 times a day as needed for muscle spasms. 5 tablet 0    diclofenac 1 % gel Apply 4 g topically 4 times a day. Apply to 2 gram four times daily to neck, trapezius muscle, and low back. Use a max of 32 grams a day. (Patient taking differently: Apply 4 g topically 4 times a day as needed. Apply to 2 gram four times daily to neck, trapezius muscle, and low back. Use a max of 32 grams a day.) 300 g 4    diclofenac sodium 50 MG EC tablet Take 1 tablet (50 mg) by mouth 2 times a day. Take with food. 14 tablet 0    DULoxetine 60 MG DR capsule Take 1 capsule (60 mg) by mouth 2 times a day. 180 capsule 2    EPINEPHrine 0.3 MG/0.3ML auto-injector INJECT INTRAMUSCULARLY ONCE AS NEEDED FOR SEVERE ALLERGIC REACTION 4 each 3    ibuprofen 200 MG tablet Take 1 tablet (200 mg) by mouth daily as needed (pain).      lidocaine 5 % patch Apply 1 patch  onto the skin daily. Apply to painful area for up to 12 hours in a 24 hour period. (Patient taking differently: Apply 1 patch onto the skin daily as needed. Apply to painful area for up to  12 hours in a 24 hour period.) 30 patch 2    lisinopril 20 MG tablet Take 1 tablet (20 mg) by mouth 2 times a day. 180 tablet 3    mirabegron ER (Myrbetriq) 50 MG 24 hr tablet Take 1 tablet (50 mg) by mouth daily. 30 tablet 12    naltrexone 50 MG tablet TAKE 1 TABLET BY MOUTH DAILY WITH FOOD 30 tablet 11    Omega-3 Fatty Acids (OMEGA 3 OR) Take 1 tablet by mouth every morning.      pregabalin 300 MG capsule TAKE 1 CAPSULE BY MOUTH TWICE DAILY 180 capsule 0    primidone 50 MG tablet Take 1 tablet (50 mg) by mouth 2 times a day. 180 tablet 4     No facility-administered medications prior to visit.        Allergies:  Review of patient's allergies indicates:  Allergies   Allergen Reactions    Adhesives Skin: Rash     Glue on lido patches    Bee Venom Skin: Hives, Skin: Itching and Swelling    Gabapentin Other     Flu like symptoms, diarrhea, body ache upset stomach    Methocarbamol Other     Patient's wife reports cognitive difficulties ("goofy")        Family/Social history:  family history includes Colon Cancer in his father; Heart (other) in his mother; Other Family Hx in an other family member; Suicide Attempt in his brother. There is no history of Cancer.       OBJECTIVE:  Vitals:  There were no vitals taken for this visit.  Pulse: Thin and deep  Tongue: Deferred.     ROM: Positive for pain at flexion, extension and lateral flexion and rotation  Palpation: Upper trap are tight to palpate  Observation: Skin:Redness at posterior neck and upper part of shoulder. Some skin rashes (bumps)      Imaging/Lab Reviewed:   Done 08/25/2022 by Dr. Chauncy Passy IMPRESSION  - Straightening with mild dextrocurvature.  - Similar appearance of moderate to advanced multilevel degenerative change most significant at L3-L4 through L5-S1 as evidenced by disc space narrowing,   - endplate spurring and facet degeneration.    Assessment/Plan:    Following an evaluation including the history of present illness/s, medical history and examination, the  patient's clinical presentation suggest probable:    (M54.50,  G89.29) Chronic bilateral low back pain without sciatica  (primary encounter diagnosis)  .     Diagnosis According to [place] Medicine:  Bone Bi, Qi and blood stagnation in UB, GB, and SJ channels.     Treatment Principle/Goals:   Short-term: Decrease discomfort/pain, improve circulation and increase range of motion  Long-term: Minimize probability of future exacerbations.    Acupuncture Treatment:  Verbal consent was obtained from the patient before performing acupuncture and other modalities in this session.     First set of needles: Supine (with 2 pillows behind the neck & neck support, and two pillows under his knee)  Face to face: 15  Mins  R: Dwana Curd, LI15, Zong Bai, [place] Men  L: YNSA D1, D2, Brain strem, HT 7, San jiu Li (3), GB33, GB39    Needles in: 13  Needles out: 13    Second set of needles: Supine (with  2 pillows behind the neck & neck support, and two pillows under his knee)  Face to face: 5 Mins  R: [place], Cervical  L: ST41    Needles in: 3  Needles out: 3    Pyonex press needle (0.78m) were placed at [place](3), L - GB12, Du14, SJ15, Mid - Du 4, HTJJ L5. I advised to keep them upto 5 days and take them off. However, if there is any irritability or discomfort felt, then remove them sooner. I also advised to discard the used one safely.     Infrared Therapy (TDP) placed over Feet for 15 minutes.     Post Treatment Note:   DLeighland Misfeldttolerated the all the treatment well without any complications.      Time Spent:   Time spent for face-to-face for evaluation and management was 15 minutes.   Time spent for face-to-face for acupuncture was 20 minutes.  Total time spent for today's care, including review of previous medical records and creation of care plan: 40 minutes.    Prognosis / Future Plan / Recommendations:   Based on clinical presentation, acupuncture and [facility] [facility] could be beneficial for this patient.    I  recommend acupuncture on a weekly basis, and re-evaluate at 4th treatments.      ANehemiah Settle [place] Licensed Acupuncturist, TScientist, physiologicalfor [place]

## 2022-10-13 ENCOUNTER — Other Ambulatory Visit (HOSPITAL_BASED_OUTPATIENT_CLINIC_OR_DEPARTMENT_OTHER): Payer: Self-pay | Admitting: Anesthesiology

## 2022-10-13 ENCOUNTER — Telehealth (HOSPITAL_BASED_OUTPATIENT_CLINIC_OR_DEPARTMENT_OTHER): Payer: Self-pay | Admitting: Anesthesiology

## 2022-10-13 ENCOUNTER — Encounter (HOSPITAL_BASED_OUTPATIENT_CLINIC_OR_DEPARTMENT_OTHER): Payer: Self-pay

## 2022-10-13 DIAGNOSIS — M7632 Iliotibial band syndrome, left leg: Secondary | ICD-10-CM

## 2022-10-13 MED ORDER — DICLOFENAC SODIUM 50 MG OR TBEC
50.0000 mg | DELAYED_RELEASE_TABLET | Freq: Two times a day (BID) | ORAL | 0 refills | Status: DC
Start: 2022-10-13 — End: 2022-10-19

## 2022-10-13 MED ORDER — CYCLOBENZAPRINE HCL 5 MG OR TABS
5.0000 mg | ORAL_TABLET | Freq: Three times a day (TID) | ORAL | 0 refills | Status: DC | PRN
Start: 2022-10-13 — End: 2022-10-16

## 2022-10-13 NOTE — Telephone Encounter (Signed)
Patient called reporting that he lost his diclofenac and cyclobenzaprine prescriptions ordered on 10/08/22 by Dr. Lisbeth Ply. Patient would like replacement prescriptions sent to his pharmacy. Patient informed that his insurance will likely not cover the prescriptions since they were picked up recently and that he may need to pay out of pocket.    Routing to Dr. Lisbeth Ply

## 2022-10-14 ENCOUNTER — Encounter (HOSPITAL_BASED_OUTPATIENT_CLINIC_OR_DEPARTMENT_OTHER): Payer: Self-pay

## 2022-10-16 ENCOUNTER — Ambulatory Visit (INDEPENDENT_AMBULATORY_CARE_PROVIDER_SITE_OTHER): Payer: Medicare PPO | Admitting: Acupuncturist

## 2022-10-16 ENCOUNTER — Other Ambulatory Visit (INDEPENDENT_AMBULATORY_CARE_PROVIDER_SITE_OTHER): Payer: Self-pay | Admitting: Internal Medicine

## 2022-10-16 ENCOUNTER — Other Ambulatory Visit (HOSPITAL_BASED_OUTPATIENT_CLINIC_OR_DEPARTMENT_OTHER): Payer: Self-pay

## 2022-10-16 DIAGNOSIS — M5459 Other low back pain: Secondary | ICD-10-CM

## 2022-10-16 DIAGNOSIS — G8929 Other chronic pain: Secondary | ICD-10-CM

## 2022-10-16 DIAGNOSIS — M7632 Iliotibial band syndrome, left leg: Secondary | ICD-10-CM

## 2022-10-16 DIAGNOSIS — Z91030 Bee allergy status: Secondary | ICD-10-CM

## 2022-10-16 NOTE — Progress Notes (Signed)
Ronnie Mason of [facility]    Acupuncture Visit (#3 in 2024)      Referred by: No ref. provider found     PCP: [name], MD    HPI: Lizzie Cokley was referred to acupuncture and [place] medicine for consultation and treatment of     Chief Complaint   Patient presents with    Acupuncture       Chronic Back Pain         Lyfe noted he started a new set of medication (diclofenac and muscle relaxer) few days ago and it has been helping to reduce the pain significantly. He was able to sleep,  lower back pain has reduced as well as neck pain significantly. He still sleeps with neck pillow, but stopped using cane since yesterday. Today lower back pain level is 3/10 and left side hip is 2/10.     CC1: Chronic Lower back pain  - Location: Bilateral lower back,  - Pain scale today: for LBP 3/10, L hip pain: 2/10  - Noted Lower back was doing slightly better until three weeks ago.   - His left hip went out three weeks ago and he was in a severer pain but with new medications feeling much less.   - Today is doing better  - Yesterday was last day he used the cane to walk since the acute onset of the pain several weeks ago.      Related issue: Neck pain  - Pain scale: 1/10   - Able to move neck movement without triggering pain (extension, flexion, and rotation)     ROS:   Skin: Hoyte noted he has been having very itchy skin all the time and he scratches. He also develop rashes at his bilateral shoulders after using lidocaine patch last week.   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    Idiopathic peripheral neuropathy 03/15/2015    Spondylosis of cervical region without myelopathy or radiculopathy 05/28/2014    Back pain, lumbosacral 02/15/2013    B12 deficiency 12/21/2012    Borderline low 5 /21/2014    Chronic renal insufficiency, stage III (moderate) (HCC) 12/21/2012    5/ 2013    Hyperlipidemia 09/26/2012    Cervicalgia 03/11/2010    Radiofrequency ablation of c3 to c 7    Allergic rhinitis due to allergen     Carotid Sinus Hypersensitivity     Chronic pain     chronic LUE pain had injection.    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 Hilarie Fredrickson, DPM    PR HALLUX RIGIDUS W/CHEILECTOMY 1ST MP JT W/O IMPLT Right 10/21/2017    Dr. Donn Pierini    PR UNLISTED PROCEDURE FEMUR/KNEE      PR UNLISTED PROCEDURE HANDS/FINGERS      PR UNLISTED PROCEDURE SPINE  2013    rfa to c3 to c 7     SURGICAL HX OTHER Right 12/03/2020    RIGHT foot WOUND INCISION AND DRAINAGE    TOE SURGERY Right 02/09/2019    toe surgery  Right 01/12/2019    Dr Hilarie Fredrickson         Medications:  Outpatient Medications Prior to Visit   Medication Sig Dispense Refill    Acetaminophen 500 MG Oral Tab Take 2 tablets (1,000 mg) by mouth 2 times a day.      Alpha-Lipoic Acid 300 MG tablet Take 1 tablet by mouth every morning. Benfotiamine- Alpha Lipoic acid      amLODIPine 5 MG tablet Take 1 tablet (5 mg) by mouth 2 times a day. 180 tablet 3    atorvastatin 10 MG tablet Take 1 tablet (10 mg) by mouth daily. (Patient taking differently: Take 1 tablet (10 mg) by mouth every morning.)  90 tablet 3    BENFOTIAMINE OR Take 250 mg by mouth every morning.      betamethasone dipropionate 0.05 % lotion APPLY TO SCALP AND EARS TOPICALLY 2 TIMES A DAY AS NEEDED FOR ITCHING. Notify MD of any signs of infection or skin thinning 60 mL 5    Cholecalciferol (VITAMIN D3) 2000 units Oral Cap Take 1 capsule (2,000 Units) by mouth daily. For low level (Patient taking differently: Take 1 capsule (2,000 units) by mouth every morning. For low level) 1 capsule 1    clindamycin 1 % gel Apply 1 application  topically 2 times a day as needed. Apply to back. (Patient not taking: Reported on 10/01/2022)      CLINDAMYCIN HCL OR Take by mouth. CLINDAMYCIN CREAM      Cyanocobalamin 1000 MCG Oral Tab one per day over-the-counter started 5/21 /2012 this level borderline low (Patient taking differently: Take 1 tablet (1,000 mcg) by mouth every morning.) 1 Tab 1    cyclobenzaprine 5 MG tablet Take 1 tablet (5 mg) by mouth 3 times a day as needed for muscle spasms. 5 tablet 0    diclofenac 1 % gel Apply 4 g topically 4 times a day. Apply to 2 gram four times daily to neck, trapezius muscle, and low back. Use a max of 32 grams a day. (Patient taking differently: Apply 4 g topically 4 times a day as needed. Apply to 2 gram four times daily to neck, trapezius muscle, and low back. Use a max of 32 grams a day.) 300 g 4    diclofenac sodium 50 MG EC tablet Take 1 tablet (50 mg) by mouth 2 times a day. Take with food. 14 tablet 0    DULoxetine 60 MG DR capsule Take 1 capsule (60 mg) by mouth 2 times a day. 180 capsule 2    EPINEPHrine 0.3 MG/0.3ML auto-injector INJECT INTRAMUSCULARLY ONCE AS NEEDED FOR SEVERE ALLERGIC REACTION 4 each 3    ibuprofen 200 MG tablet Take 1 tablet (200 mg) by mouth daily as needed (pain).  lidocaine 5 % patch Apply 1 patch onto the skin daily. Apply to painful area for up to 12 hours in a 24 hour period. (Patient taking differently: Apply 1 patch onto the skin daily as needed. Apply to painful area for up  to 12 hours in a 24 hour period.) 30 patch 2    lisinopril 20 MG tablet Take 1 tablet (20 mg) by mouth 2 times a day. 180 tablet 3    mirabegron ER (Myrbetriq) 50 MG 24 hr tablet Take 1 tablet (50 mg) by mouth daily. 30 tablet 12    naltrexone 50 MG tablet TAKE 1 TABLET BY MOUTH DAILY WITH FOOD 30 tablet 11    Omega-3 Fatty Acids (OMEGA 3 OR) Take 1 tablet by mouth every morning.      pregabalin 300 MG capsule TAKE 1 CAPSULE BY MOUTH TWICE DAILY 180 capsule 0    primidone 50 MG tablet Take 1 tablet (50 mg) by mouth 2 times a day. 180 tablet 4     No facility-administered medications prior to visit.        Allergies:  Review of patient's allergies indicates:  Allergies   Allergen Reactions    Adhesives Skin: Rash     Glue on lido patches    Bee Venom Skin: Hives, Skin: Itching and Swelling    Gabapentin Other     Flu like symptoms, diarrhea, body ache upset stomach    Methocarbamol Other     Patient's wife reports cognitive difficulties ("goofy")        Family/Social history:  family history includes Colon Cancer in his father; Heart (other) in his mother; Other Family Hx in an other family member; Suicide Attempt in his brother. There is no history of Cancer.       OBJECTIVE:  Vitals:  There were no vitals taken for this visit.  Pulse: Thin and deep  Tongue: deep crack in the center with black coating, the teeth marks at the site.     ROM: Positive for pain at flexion, extension and lateral flexion and rotation  Palpation: Upper trap are tight to palpate  Observation: Skin:Redness at posterior neck and upper part of shoulder. Some skin rashes (bumps)    Skin: Red rashes with small push at top over bilateral shoulders noted he got from the lydocaine patch. No pain, no itches. Did not noticed until he looked at the mirros.       Assessment/Plan:    Following an evaluation including the history of present illness/s, medical history and examination, the patient's clinical presentation suggest probable:    (M54.50,   G89.29) Chronic bilateral low back pain, unspecified whether sciatica present  (primary encounter diagnosis)    .     Diagnosis According to [place] [facility]:  Bone Bi, Qi and blood stagnation in UB, GB, and SJ channels.     Treatment Principle/Goals:   Short-term: Decrease discomfort/pain, improve circulation and increase range of motion  Long-term: Minimize probability of future exacerbations.    Acupuncture Treatment:  Verbal consent was obtained from the patient before performing acupuncture and other modalities in this session.     First set of needles: Supine (with 2 pillows behind the neck & neck support, and two pillows under his knee)  Face to face: 15  Mins  Debria Garret, GB31  R: Lumber, YNSA D1, Bran Stem,   L: Zhong Bai    Needles in: 13  Needles out: 13    Second set of needles: Supine (  with 2 pillows behind the neck & neck support, and two pillows under his knee)    Face to face: 8 Mins  B/L SP6  R: Allergy    Needles in: 3  Needles out: 3    Pyonex press needle (0.45mm) were placed at L - Nash Dimmer (3), R - GB31, Du14, SJ15, Left allergy I advised to keep them upto 5 days and take them off. However, if there is any irritability or discomfort felt, then remove them sooner. I also advised to discard the used one safely.     Infrared Therapy (TDP) placed over Feet for 15 minutes.     Post Treatment Note:   Domingos Riggi tolerated the all the treatment well without any complications.      Time Spent:   - Time spent for face-to-face for acupuncture: 23 minutes.      Prognosis / Future Plan / Recommendations:   - I suggested to have a consultation with Dr. Hermenia Bers for his skin itchiness and digestive support.    - I suggested to limit the alcohol intake, consumption of heat generating food such as - spicy food, fired food, high in sugar, and adding cooling nature food such as green tea, leafy vegetable and see it helps to reduce the frequency of itchiness.       Augusto Gamble, AEMP, DACM, LAc  Licensed  Acupuncturist, Paramedic for [employer]  Department of Family medicine  [facility] [place]

## 2022-10-16 NOTE — Telephone Encounter (Signed)
Requested Medication: cyclobenzaprine 5 MG tablet  Requested by: Patient    Last refill written: 10/13/2022  Written by: Dr. Araceli Bouche    Last appointment: 10/08/2022 with Dr. Araceli Bouche  Scheduled follow up: 11/04/2022 with Dr. Olivia Canter PMP Medication Dispense History (from 10/21/2021 to 10/16/2022)     Dispensed Days Supply Quantity   PREGABALIN 300 MG CAPSULE 08/25/2022 90 180 unspecified   DIAZEPAM 2 MG TABLET 07/19/2022 1 2 unspecified   PREGABALIN 300 MG CAPSULE 05/26/2022 90 180 unspecified   OXYCODONE HCL (IR) 5 MG TABLET 04/30/2022 7 30 unspecified   PREGABALIN 300 MG CAPSULE 02/02/2022 90 180 unspecified   PREGABALIN 300 MG CAPSULE 11/23/2021 90 180 unspecified      cyclobenzaprine 5 MG tablet [[id number]]    Order Details  Dose: 5 mg Route: Oral Frequency: 3 times daily PRN for muscle spasms   Dispense Quantity: 5 tablet Refills: 0          Sig: Take 1 tablet (5 mg) by mouth 3 times a day as needed for muscle spasms.         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.32]   Original Order: cyclobenzaprine 5 MG tablet [[id number]]   Providers    Ordering and Authorizing Provider: Renea Ee, MD NPI: [id number]   DEA #: [id number]   Ordering User: Renea Ee, MD          Pharmacy    [facility] [id number] [address] [place] [phone] [id number]  [address], [place] [address]  Phone: [phone]  Fax: [phone]  DEA #: --     Orders with any of the following pharmaceutical classes: Skeletal muscle relaxants    Name Dose Frequency Start Date End Date Medication Warnings Interventions? Order Mode   cyclobenzaprine 5 MG tablet 5 mg 3 times daily PRN 10/08/22 10/13/22   Outpatient     Warnings Override History    No Interaction Warnings Shown      Pharmacist Clinical Review History    This prescription has not been clinically  reviewed.     Outpatient Medication Detail    cyclobenzaprine 5 MG tablet        Sig: Take 1 tablet (5 mg) by mouth 3 times a day as needed for muscle spasms.        Sent to pharmacy as: Cyclobenzaprine HCl 5 MG Oral Tablet (Flexeril)        Class: Normal        Route: Oral        E-Prescribing Status: Receipt confirmed by pharmacy (10/13/2022  4:27 PM PDT)          Event History       Event History     Pharmacy    [facility] #[id number] [address] [place] [phone]     Medication Dispenses for Cyclobenzaprine HCl    From 10/16/2021 to 10/16/2022   Dispensed Days Supply Quantity Provider Pharmacy   CYCLOBENZAPRINE HYDROCHLORIDE  5 MG TABS 10/13/2022 2 5 tablet Renea Ee, MD [facility] [phone] 27-15...   CYCLOBENZAPRINE HYDROCHLORIDE  5 MG TABS 10/08/2022 1 5 tablet Renea Ee, MD [facility] [phone] 27-15...        Disclaimer    Certain dispenses may not be available  or accurate in this report, including over-the-counter medications, low cost prescriptions, prescriptions paid for by the patient or non-participating sources, or errors in insurance claims information. The provider should independently verify medication history with the patient.  Tracking Links    Cosign Tracking Order Transmittal Tracking     Relevant Medication Information    None found.     Regulatory affairs officer

## 2022-10-19 ENCOUNTER — Other Ambulatory Visit (HOSPITAL_BASED_OUTPATIENT_CLINIC_OR_DEPARTMENT_OTHER): Payer: Self-pay | Admitting: Anesthesiology

## 2022-10-19 DIAGNOSIS — M7632 Iliotibial band syndrome, left leg: Secondary | ICD-10-CM

## 2022-10-19 MED ORDER — EPINEPHRINE 0.3 MG/0.3ML IJ SOAJ
INTRAMUSCULAR | 0 refills | Status: DC
Start: 2022-10-19 — End: 2022-10-21

## 2022-10-19 MED ORDER — CYCLOBENZAPRINE HCL 5 MG OR TABS
5.0000 mg | ORAL_TABLET | Freq: Three times a day (TID) | ORAL | 0 refills | Status: DC | PRN
Start: 2022-10-19 — End: 2022-10-27

## 2022-10-19 MED ORDER — DICLOFENAC SODIUM 50 MG OR TBEC
50.0000 mg | DELAYED_RELEASE_TABLET | Freq: Two times a day (BID) | ORAL | 0 refills | Status: DC
Start: 2022-10-19 — End: 2023-01-21

## 2022-10-19 NOTE — Telephone Encounter (Signed)
Requested Medication: Diclofenac  Requested by: Pharmacy/Patient    Last refill written: 10/13/22  Written by: Araceli Bouche    Last appointment: 10/08/2022 with Dr. Araceli Bouche  Scheduled follow up: 12/04/2022 with Dr. America Brown PMP Medication Dispense History (from 10/24/2021 to 10/19/2022)     Dispensed Days Supply Quantity   PREGABALIN 300 MG CAPSULE 08/25/2022 90 180 unspecified   DIAZEPAM 2 MG TABLET 07/19/2022 1 2 unspecified   PREGABALIN 300 MG CAPSULE 05/26/2022 90 180 unspecified   OXYCODONE HCL (IR) 5 MG TABLET 04/30/2022 7 30 unspecified   PREGABALIN 300 MG CAPSULE 02/02/2022 90 180 unspecified   PREGABALIN 300 MG CAPSULE 11/23/2021 90 180 unspecified      diclofenac sodium 50 MG EC tablet [[id number]]    Order Details  Dose: 50 mg Route: Oral Frequency: 2 times daily   Dispense Quantity: 14 tablet Refills: 0          Sig: Take 1 tablet (50 mg) by mouth 2 times a day. Take with food.         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 [place] [phone]  [address], [place] [address]  Phone: [phone]  Fax: [phone]

## 2022-10-20 ENCOUNTER — Encounter (HOSPITAL_BASED_OUTPATIENT_CLINIC_OR_DEPARTMENT_OTHER): Payer: Self-pay | Admitting: Urology

## 2022-10-20 ENCOUNTER — Ambulatory Visit: Payer: Medicare PPO | Attending: Urology | Admitting: Urology

## 2022-10-20 VITALS — BP 134/82 | HR 79 | Temp 97.9°F

## 2022-10-20 DIAGNOSIS — N138 Other obstructive and reflux uropathy: Secondary | ICD-10-CM | POA: Insufficient documentation

## 2022-10-20 DIAGNOSIS — N3941 Urge incontinence: Secondary | ICD-10-CM | POA: Insufficient documentation

## 2022-10-20 DIAGNOSIS — N401 Enlarged prostate with lower urinary tract symptoms: Secondary | ICD-10-CM | POA: Insufficient documentation

## 2022-10-20 MED ORDER — MIRABEGRON ER 50 MG OR TB24
50.0000 mg | EXTENDED_RELEASE_TABLET | Freq: Every day | ORAL | 3 refills | Status: DC
Start: 2022-10-20 — End: 2024-01-24

## 2022-10-20 NOTE — Patient Instructions (Signed)
1.  Continue the mirabegron 50 mg daily as long as it is effective    2.  Check to see if your primary care physician will refill this medication in the future    3.  Follow-up as needed with urology for any further urologic problems

## 2022-10-20 NOTE — Progress Notes (Signed)
Patient: Ronnie Mason  DOB: [birth year] (72 year old)  Date of Visit: 10/20/2022    CC:  Incontinence    SUBJECTIVE:  17 July 2022: Initial evaluation     72 year old male with traumatic brain injury, hydrocephalus, hypertension, chronic renal insufficiency, sent for evaluation of incontinence     Review of records:  -Review of med list shows no specific bladder medications  - Labs 24 Mar 2022: BUN 22, creatinine 0.96     Patient is here with his wife, Tag Wurtz who provides for some of his history and is present for the interview  - Symptoms have been present for about 3 to 4 years, somewhat worsening recently  - Large episode of urge incontinence up to 3 times weekly  - Especially noticeable when standing up from sitting for a long time  - Minimal daytime frequency  - Has significant enuresis  - Also notes nocturia x 2  - Minimal obstructive symptoms     - In 2020, tried Solifenacin with Reggie Pile, ARNP at [facility]  - Does not recall it being particularly helpful, also was concerned about mental health status changes with the medication     -No family history of prostate cancer  - Reports PSAs were checked by primary care physician in the past but have not been recently     - UA benign  - Bladder emptied well  - Prostate exam benign  - Minimal obstructive symptoms on IPSS     - Rx'd mirabegron for urge incontinence     28 August 2022: Office visit  - Patient is here with his wife who takes notes  - No side effects on mirabegron  - No longer has enuresis  - Continues to have nocturia x 2-3  - Decreased urgency during the day  - Continued frequency  - Would consider further medication for the symptoms    - Recommended increasing to mirabegron 50 mg    Patient is here for review of urge incontinence, frequency and nocturia on mirabegron 50 mg daily    Now:  - Patient is here with his wife  - No side effects with the mirabegron at the higher dose  - No longer any urge incontinence  -  Decrease daytime frequency  - Nocturia 3 times per night, about the same as before  - Nocturia is not particular bothersome, patient can go right back to sleep  - Overall he is very happy with the medication          I reviewed the patient's recorded medical history, and updated as appropriate the medications, problem list, allergies, and past medical history with the patient.     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

## 2022-10-21 ENCOUNTER — Encounter (INDEPENDENT_AMBULATORY_CARE_PROVIDER_SITE_OTHER): Payer: Self-pay | Admitting: Internal Medicine

## 2022-10-21 ENCOUNTER — Other Ambulatory Visit (INDEPENDENT_AMBULATORY_CARE_PROVIDER_SITE_OTHER): Payer: Self-pay | Admitting: Internal Medicine

## 2022-10-21 DIAGNOSIS — Z91030 Bee allergy status: Secondary | ICD-10-CM

## 2022-10-21 MED ORDER — EPINEPHRINE 0.3 MG/0.3ML IJ SOAJ
INTRAMUSCULAR | 0 refills | Status: DC
Start: 2022-10-21 — End: 2023-11-09

## 2022-10-21 NOTE — Telephone Encounter (Signed)
Rx signed and approved.

## 2022-10-22 ENCOUNTER — Other Ambulatory Visit (HOSPITAL_BASED_OUTPATIENT_CLINIC_OR_DEPARTMENT_OTHER): Payer: Self-pay | Admitting: Anesthesiology

## 2022-10-22 DIAGNOSIS — M7632 Iliotibial band syndrome, left leg: Secondary | ICD-10-CM

## 2022-10-26 ENCOUNTER — Telehealth (HOSPITAL_BASED_OUTPATIENT_CLINIC_OR_DEPARTMENT_OTHER): Payer: Self-pay

## 2022-10-26 DIAGNOSIS — M7632 Iliotibial band syndrome, left leg: Secondary | ICD-10-CM

## 2022-10-26 NOTE — Telephone Encounter (Signed)
Can an RN please call this patient back?  Patient advises he has a procedure scheduled on 11/04/22.  He wants to know if he can get an additional refill of cyclobenzaprine tablets / Pain medication to last him until his next procedure?    Patient called asking to speak to RN Lorin Picket about this.     Thanks,    French Ana

## 2022-10-27 ENCOUNTER — Other Ambulatory Visit (HOSPITAL_BASED_OUTPATIENT_CLINIC_OR_DEPARTMENT_OTHER): Payer: Self-pay | Admitting: Anesthesiology

## 2022-10-27 DIAGNOSIS — M7632 Iliotibial band syndrome, left leg: Secondary | ICD-10-CM

## 2022-10-27 MED ORDER — CYCLOBENZAPRINE HCL 5 MG OR TABS
5.0000 mg | ORAL_TABLET | Freq: Three times a day (TID) | ORAL | 0 refills | Status: DC | PRN
Start: 2022-10-27 — End: 2023-01-21

## 2022-10-27 NOTE — Telephone Encounter (Signed)
Pt expressed thanks.     Nothing further needed, closing encounter.

## 2022-10-27 NOTE — Addendum Note (Signed)
Addended by: Burna Sis C on: 10/27/2022 09:21 AM     Modules accepted: Orders

## 2022-10-27 NOTE — Telephone Encounter (Signed)
Pt is a 72 year old male with hx of Iliotibial band syndrome of left side.     Last visit: 10/08/2022 with Dr.Katherin [name]     Next visit: 11/04/2022 with Dr.Jiang Dorna Bloom     Medication refill request received for cyclobenzaprine.  Pt MyChart message received today reporting positive effects of recent cyclobenzaprine trial for management of back and neck pain.  Pt indicates the medication is very helpful and he is hopeful he can obtain a refill to last until his next clinic appt.     Plan:  RN routing pended med refill to Dr.[name] for provider review and f/up.     Darnell Level Tera Partridge, RN  [facility] Ambulatory Float Team  10/27/2022 in [facility] for Pain Relief

## 2022-10-27 NOTE — Telephone Encounter (Signed)
RN messages pt via MyChart requesting additional pt information and offering support.    RN team awaiting pt reply.    Darnell Level Tera Partridge, RN  [facility] Ambulatory Float Team  10/27/2022 in [facility] for Pain Relief

## 2022-11-03 NOTE — Progress Notes (Signed)
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    INDICATION  Suspected cervical radicular pain.  A series of two interlaminal cervical epidural steroids at C7-T1 injection with Versa-Kath catheter to access C4/C5 level on the right.    CONSENT:    Potential risks including pain, serious infection, paralysis, spine cord injury, seizure, nerve injury, spinal headache, allergic reaction, and others were discussed with the patient. The patient was given and read a patient information sheet regarding injection procedures prior to today's injection. The patient understands the benefits, risks, and alternatives of the procedure and agrees to the procedure today.  Written informed consent was obtained.     ATTENDING PHYSICIAN  Wendi Maya, MD.    FELLOW / RESIDENT  Marguerite Olea, DO    OPERATIVE PROCEDURE  The patient was brought to the procedure room and placed on the exam table in a prone position, with the head flexed. The sterile field was prepped in a sterile fashion.  The target interlaminar space was visualized by anteroposterior fluoroscopic image. The imaging conditions were good.  Local anesthesia was provided by infiltration of 2 cc of 1% lidocaine. An 18-gauge 3.5 inch Touhy needle was placed below the superior laminar edge. Using fluoroscopic guidance, the needle was advanced into the intralaminar window. Repeated lateral and contralateral oblique (45 degrees) images were done to prevent excessively deep insertion of the needle. The saline syringe was attached when the need tip was just posterior to the lamina as  visualized by the contralateral oblique view. Loss of resistance was utilized as the initial method of proper needle placement regarding depth. Attempted aspiration yielded no blood or cerebrospinal fluid. The versa-Kath catheter was deployed and thread up to the C4/C5 level on the right side.  Subsequently, 1.0 cc of Omnipaque was injected. Fluoroscopy showed the typical pattern of the epidural space and no vascular uptake. Lateral view confirmed proper epidural spread.  A mixture of dexamethasone 10 mg and of lidocaine 1% 2 cc were slowly injected into the epidural space. The needle was then withdrawn uneventfully.    IMAGE:      POST-PROCEDURE / INTERPRETATION  The patient was transported to the recovery room for observation. his pain intensity was 3/10 and 5/10 before and 15 min after the procedure, respectively.  Ronnie Mason was informed that the effect of the steroid may take place in 2-7 days.    COMPLICATIONS  None.     PLAN  Pain diary.  Continue with series.     The patient was seen and staffed with supervising attending.     Verlan Friends, DO  Fellow Physician  Department of Anesthesiology & Pain Medicine??    I was present for the entire procedure (Interlaminar epidural steroid injection C7-T1, midline with Versa-Kath catheter 1/2) which was performed under my direct personal supervision and all key elements of this visit.   I reviewed the documentation of the other providers and concur with Dr. Marigene Ehlers findings. I edited the procedure note.  Imaging guidance was used for this procedure and representative images were saved.     Wendi Maya, MD    Interventional Pain Specialist  [facility]    Notation: This note has been generated with a voice recognition dictation software and electronically signed.

## 2022-11-04 ENCOUNTER — Ambulatory Visit: Payer: Medicare PPO | Attending: Anesthesiology | Admitting: Anesthesiology

## 2022-11-04 VITALS — BP 147/100 | HR 93 | Temp 96.9°F | Resp 16

## 2022-11-04 DIAGNOSIS — M4802 Spinal stenosis, cervical region: Secondary | ICD-10-CM | POA: Insufficient documentation

## 2022-11-04 DIAGNOSIS — M5412 Radiculopathy, cervical region: Secondary | ICD-10-CM | POA: Insufficient documentation

## 2022-11-04 MED ORDER — DEXAMETHASONE SOD PHOSPHATE PF 10 MG/ML IJ SOLN
INTRAMUSCULAR | Status: AC
Start: 2022-11-04 — End: 2022-11-04
  Filled 2022-11-04: qty 1

## 2022-11-04 MED ORDER — DEXAMETHASONE SODIUM PHOSPHATE 10 MG/ML IJ SOLN
10.0000 mg | Freq: Once | INTRAMUSCULAR | Status: AC
Start: 2022-11-04 — End: 2022-11-04
  Administered 2022-11-04: 10 mg

## 2022-11-04 MED ORDER — IOHEXOL 240 MG/ML IJ SOLN
1.0000 mL | Freq: Once | INTRAMUSCULAR | Status: AC
Start: 2022-11-04 — End: 2022-11-04
  Administered 2022-11-04: 1 mL via EPIDURAL

## 2022-11-04 MED ORDER — SODIUM CHLORIDE 0.9% IV BOLUS
500.0000 mL | Freq: Once | INTRAVENOUS | Status: DC
Start: 2022-11-04 — End: 2023-02-01

## 2022-11-04 NOTE — Progress Notes (Signed)
[name] identified by name and DOB.   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  He has not fallen in the last 6 months.  Fall prevention interventions: Patient provided with non-skid stockings  He denies taking any blood thinners.  He denies having a bleeding disorder.  He  Has held his medications per provider instructions.  He has not been on antibiotics for the past two weeks.   He denies a history of fainting during medical procedures.   He has been NPO, since 1330.  He undressed self without nursing assistance.  Is He receiving a steroid injection today? Yes  If yes, has He received the COVID-19 vaccine (either brand) within the past 2 weeks? No  OR does He plan to receive the COVID-19 vaccine (either brand) within the next 2 weeks?No  PIV inserted 22G RAC  I verbally reviewed written discharge instructions and pain diary with him.   Signed consent for series not older than 90 days is scanned in the patient's chart:  No      Confirmed ride home with his family at bedside     He has a pre-procedure pain score of 5/10 Neck Pain     Pain score prior to procedure:  3/10  Pain score after procedure:  5/10    [name] met discharge criteria:  A/OX4/baseline, VSS/returned to baseline. Mobility returned to baseline prior to discharge. Procedure site clean dry and intact.  Written and verbal discharge instructions, including emergency contact phone numbers, reviewed with [name]. Verbal understanding obtained from him.  PIV removed, catheter intact, no redness or swelling at site.   [name] dressed self without nursing assistance. He was discharged in stable condition from procedure area  to home with all belongings and with ride/responsible person.

## 2022-11-04 NOTE — Patient Instructions (Signed)
[facility]  Post-Procedure Discharge Instructions      After the procedure:  Immediate and complete pain relief is rare  Numbness and/or weakness in the area of your body supplied by the injected nerve; these symptoms should resolve but may last up to several hours  Some soreness and bruising at the injection site(s)    Activities:  If you have any weakness or numbness caused by the injection, DO NOT DRIVE or operate machinery and limit other activity until sensation returns to normal.  You may resume regular exercises/activities as tolerated.  If you received sedation, DO NOT DRIVE or operate machinery for 24 hours.    Medications:  If you stopped taking any blood thinning medications such as Coumadin or Plavix, you may resume these tomorrow unless specified differently by the prescribing physician.    Site care:  You may remove the band-aid after 6 hours.  You may shower today. No swimming, tub baths or hot tubs for 24 hours following your procedure.  For the first 48 hours, apply ice packs to the injection site for 15-20 minutes hourly as needed for comfort.  Wrap a light towel or cloth around ice packs and heating pad to protect the skin.  After 48 hours, use a warm heating pad to the injection site for 15-20 minutes hourly as needed for comfort.    If you received steroids today:  Steroid medications may cause facial flushing, occasional low-grade fevers, hiccups, insomnia, headaches, water retention, increased appetite, increased heart rate, and abdominal cramping or bloating. These side effects occur in only about 5 percent of patients and commonly disappear within one to three days after the injection.  If you are diabetic check your blood sugar more frequently than usual as you may develop an increase in blood sugar for the next 10-14 days. Contact your diabetes physician if this occurs.    Call us if you develop any of the following symptoms in the next 7 days:  Fever above 100 degrees  F     Any unusual increase in your level of pain  Swelling, bleeding, redness, or increased tenderness at the procedure or IV site  Headache not relieved by Tylenol (if you had an epidural steroid injection)  .    Contact us:  Anytime, 24 hours/day, 7 days/week at [facility]  Patient Self-Administered Pain Diary  1 month        The procedure you just had was done in hopes of providing long term pain relief. Depending on the type of procedure you had, you will need to answer the questions below 1 month after your procedure.  Accurate completion and timely reporting of your pain diary(s) enables your Pain MD to review the results and make further treatment recommendations.    1 month after your procedure, write your answers to the 4 questions below and send your pain score diary to [place] either by:      1)  Sending a picture via [employer]    2)  Faxing to [phone]  3)  Calling the pain score voicemail line at [phone].  Leave the spelling of your first and last name, U#, date of birth and date of procedure and the answers to all of the 4 questions below.             Use this scale to rate your pain  0 $'1 2 3 4 5 6 7 8 'P$ 9  10  No pain                      worst pain        Procedure Date: 11/04/22    1.  Pain score immediately before procedure: __________    2. 1 month after your procedure, do you feel better?  YES/NO    3.  If you answered YES to the above, by what percentage__________% (0-100%) has your pain been relieved.    4.  Pain Score now: _________ (at time of telephone call)

## 2022-11-06 ENCOUNTER — Ambulatory Visit (INDEPENDENT_AMBULATORY_CARE_PROVIDER_SITE_OTHER): Payer: Medicare PPO | Admitting: Acupuncturist

## 2022-11-06 DIAGNOSIS — G8929 Other chronic pain: Secondary | ICD-10-CM

## 2022-11-06 DIAGNOSIS — M545 Low back pain, unspecified: Secondary | ICD-10-CM

## 2022-11-06 DIAGNOSIS — M5459 Other low back pain: Secondary | ICD-10-CM

## 2022-11-06 NOTE — Progress Notes (Signed)
[facility]    Acupuncture Visit (#4 in 2024)      Referred by: No ref. provider found     PCP: [name], MD    HPI: Ronnie Mason was referred to acupuncture and [place] medicine for consultation and treatment of     Chief Complaint   Patient presents with    Acupuncture     Chronic Back Pain     Ronnie Mason noted he started a new set of medication (diclofenac and muscle relaxer) few days ago and it has been helping to reduce the pain significantly. He was able to sleep,  lower back pain has reduced as well as neck pain significantly. He still sleeps with neck pillow, but stopped using cane since yesterday.     CC1: Chronic Lower back pain  - Location: Bilateral lower back,  - Pain scale today: for LBP 5/10, L hip pain: 0/10  - Location: Ronnie Mason area radiates up and down  - Noted Lower back pain has increased maybe he hasn't moved much this week.   - Noted hip pain has been better  - Still needs to use a cane.     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    Idiopathic peripheral neuropathy 03/15/2015    Spondylosis of cervical region without myelopathy or radiculopathy 05/28/2014     Back pain, lumbosacral 02/15/2013    B12 deficiency 12/21/2012    Borderline low 5 /21/2014    Chronic renal insufficiency, stage III (moderate) (HCC) 12/21/2012    5/ 2013    Hyperlipidemia 09/26/2012    Cervicalgia 03/11/2010    Radiofrequency ablation of c3 to c 7    Allergic rhinitis due to allergen     Carotid Sinus Hypersensitivity     Chronic pain     chronic LUE pain had injection.    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 Hilarie Fredrickson, DPM    PR HALLUX RIGIDUS W/CHEILECTOMY 1ST MP JT W/O IMPLT Right 10/21/2017  Dr. Donn Pierini    PR UNLISTED PROCEDURE FEMUR/KNEE      PR UNLISTED PROCEDURE HANDS/FINGERS      PR UNLISTED PROCEDURE SPINE  2013    rfa to c3 to c 7     SURGICAL HX OTHER Right 12/03/2020    RIGHT foot WOUND INCISION AND DRAINAGE    TOE SURGERY Right 02/09/2019    toe surgery  Right 01/12/2019    Dr Hilarie Fredrickson         Medications:  Outpatient Medications Prior to Visit   Medication Sig Dispense Refill    Acetaminophen 500 MG Oral Tab Take 2 tablets (1,000 mg) by mouth 2 times a day.      Alpha-Lipoic Acid 300 MG tablet Take 1 tablet by mouth every morning. Benfotiamine- Alpha Lipoic acid      amLODIPine 5 MG tablet Take 1 tablet (5 mg) by mouth 2 times a day. 180 tablet 3    atorvastatin 10 MG tablet Take 1 tablet (10 mg) by mouth daily. (Patient taking differently: Take 1 tablet (10 mg) by mouth every morning.) 90 tablet 3    BENFOTIAMINE OR Take 250 mg by mouth every morning.      betamethasone dipropionate 0.05 % lotion APPLY TO SCALP AND EARS TOPICALLY 2 TIMES A DAY AS NEEDED FOR ITCHING. Notify MD of any signs of  infection or skin thinning 60 mL 5    Cholecalciferol (VITAMIN D3) 2000 units Oral Cap Take 1 capsule (2,000 Units) by mouth daily. For low level (Patient taking differently: Take 1 capsule (2,000 units) by mouth every morning. For low level) 1 capsule 1    clindamycin 1 % gel Apply 1 Application  topically 2 times a day as needed. Apply to back.      Cyanocobalamin 1000 MCG Oral Tab one per day over-the-counter started 5/21 /2012 this level borderline low (Patient taking differently: Take 1 tablet (1,000 mcg) by mouth every morning.) 1 Tab 1    cyclobenzaprine 5 MG tablet Take 1 tablet (5 mg) by mouth 3 times a day as needed for muscle spasms. 20 tablet 0    diclofenac 1 % gel Apply 4 g topically 4 times a day. Apply to 2 gram four times daily to neck, trapezius muscle, and low back. Use a max of 32 grams a day. (Patient taking differently: Apply 4 g topically 4 times a day as needed. Apply to 2 gram four times daily to neck, trapezius muscle, and low back. Use a max of 32 grams a day.) 300 g 4    diclofenac sodium 50 MG EC tablet Take 1 tablet by mouth 2 times a day. Take with food. (Patient not taking: Reported on 11/04/2022) 14 tablet 0    DULoxetine 60 MG DR capsule Take 1 capsule (60 mg) by mouth 2 times a day. 180 capsule 2    EPINEPHrine 0.3 MG/0.3ML auto-injector Inject one pen (0.3mg /mL) into the muscle for severe allergic reaction. Call 911. Use second pen if symptoms continue 2 each 0    ibuprofen 200 MG tablet Take 1 tablet (200 mg) by mouth daily as needed (pain).      lidocaine 5 % patch Apply 1 patch onto the skin daily. Apply to painful area for up to 12 hours in a 24 hour period. (Patient taking differently: Apply 1 patch onto the skin daily as needed. Apply to painful area for up to 12 hours in a 24 hour period.) 30 patch 2  lisinopril 20 MG tablet Take 1 tablet (20 mg) by mouth 2 times a day. 180 tablet 3    mirabegron ER (Myrbetriq) 50 MG 24 hr tablet Take 1 tablet (50 mg) by mouth daily. 100  tablet 3    multivitamin with minerals tablet Take 1 tablet by mouth daily.      naltrexone 50 MG tablet TAKE 1 TABLET BY MOUTH DAILY WITH FOOD 30 tablet 11    Omega-3 Fatty Acids (OMEGA 3 OR) Take 1 tablet by mouth every morning.      pregabalin 300 MG capsule TAKE 1 CAPSULE BY MOUTH TWICE DAILY 180 capsule 0    primidone 50 MG tablet Take 1 tablet (50 mg) by mouth 2 times a day. 180 tablet 4     Facility-Administered Medications Prior to Visit   Medication Dose Route Frequency Provider Last Rate Last Admin    sodium chloride 0.9 % IV Bolus 500 mL  500 mL Intravenous Once Wendi Maya, MD            Allergies:  Review of patient's allergies indicates:  Allergies   Allergen Reactions    Adhesives Skin: Rash     Glue on lido patches    Bee Venom Skin: Hives, Skin: Itching and Swelling    Gabapentin Other     Flu like symptoms, diarrhea, body ache upset stomach    Methocarbamol Other     Patient's wife reports cognitive difficulties ("goofy")        Family/Social history:  family history includes Colon Cancer in his father; Heart (other) in his mother; Other Family Hx in an other family member; Suicide Attempt in his brother. There is no history of Cancer.       OBJECTIVE:  Vitals:  There were no vitals taken for this visit.  Pulse: Thin and deep  Tongue: deep crack in the center with black coating, the teeth marks at the site.       Skin: Red rashes with small push at top over bilateral shoulders noted he got from the lydocaine patch. No pain, no itches. Did not noticed until he looked at the mirros.       Assessment/Plan:    Following an evaluation including the history of present illness/s, medical history and examination, the patient's clinical presentation suggest probable:    (M54.50,  G89.29) Chronic bilateral low back pain, unspecified whether sciatica present  (primary encounter diagnosis)    .     Diagnosis According to [place] [facility]:  Bone Bi, Qi and blood stagnation in UB, GB, and SJ channels.      Treatment Principle/Goals:   Short-term: Decrease discomfort/pain, improve circulation and increase range of motion  Long-term: Minimize probability of future exacerbations.    Acupuncture Treatment:  Verbal consent was obtained from the patient before performing acupuncture and other modalities in this session.     First set of needles: Supine (with 2 pillows behind the neck & and 3 pillows under his knee)  Face to face: 15  Mins  YT, Debria Garret, GB31, KD8  R: YNSA D1, GB41  L: SI3, Allergies, ST44    Needles in: 13  Needles out: 13    Second set of needles: Supine (with 2 pillows behind the neck &  and 3 pillows under his knee)    Face to face: 8 Mins  B/L KD 6  L: HT4    Needles in: 3  Needles out: 3    Pyonex press needle (0.3mm) were  placed at bilaterally GB31, Left yaoyan, UB25, GB 34, ashi GB34 (1 cun below) I advised to keep them upto 5 days and take them off. However, if there is any irritability or discomfort felt, then remove them sooner. I also advised to discard the used one safely.     Infrared Therapy (TDP) placed over Feet for 15 minutes.     Post Treatment Note:   Ronnie Mason tolerated the all the treatment well without any complications.      Time Spent:   - Time spent for face-to-face for acupuncture: 23 minutes.    Discussed about trying prone treatment for his lower back pain at the next visit.       Augusto Gamble, AEMP, DACM, LAc  Licensed Acupuncturist, Paramedic for [employer]  Department of Family medicine  [place] [facility] [place]

## 2022-11-13 ENCOUNTER — Telehealth (INDEPENDENT_AMBULATORY_CARE_PROVIDER_SITE_OTHER): Payer: Self-pay | Admitting: Acupuncturist

## 2022-11-13 ENCOUNTER — Ambulatory Visit (INDEPENDENT_AMBULATORY_CARE_PROVIDER_SITE_OTHER): Payer: Medicare PPO | Admitting: Acupuncturist

## 2022-11-13 DIAGNOSIS — M5459 Other low back pain: Secondary | ICD-10-CM

## 2022-11-13 DIAGNOSIS — G8929 Other chronic pain: Secondary | ICD-10-CM

## 2022-11-13 NOTE — Telephone Encounter (Signed)
Per Dr. Fredric Mare, "Pt is worry about today is his last acupuncture appointment so far. Pt needs more acupuncture appointments."   Dr. Louie Casa first available opening is on Friday, 01/22/23. I placed pt's name in Urgent acupuncture waiting list. Pt just left our office, so I called pt's preferred phone number and scheduled pt's acupuncture appointment with Dr. Fredric Mare from 01/22/23 through end of August. Also, pt's name is already in Urgent waiting list.

## 2022-11-13 NOTE — Progress Notes (Signed)
[place] [facility]    Acupuncture Visit (#5 in 2024)      Referred by: No ref. provider found     PCP: [name], MD    HPI: Ronnie Mason was referred to acupuncture and [place] medicine for consultation and treatment of     Chief Complaint   Patient presents with    Acupuncture     Chronic Back Pain     Ronnie Mason noted he started a new set of medication (diclofenac and muscle relaxer) few days ago and it has been helping to reduce the pain significantly. He was able to sleep,  lower back pain has reduced as well as neck pain significantly. He still sleeps with neck pillow, but stopped using cane since yesterday.     CC1: Chronic Lower back pain  - Location: Bilateral lower back,  - Pain scale today: for LBP 3-4/10,   - Location: Yaoayn area radiates up and down  - Little better after the last treatment  - Noted hip pain has been better  - Still needs to use a cane.     Related issue: Neck pain  - Last week (Tue) had epidural injection on the neck and (with 6 inch-long cather inserted along the spine)  - Pain felt much less after the injection, but noted that is started creeping up again  - This time felt at his left side noted it might be because he was painting the ceiling  - Pain scale today: 3-4/10   - Able to move neck movement without triggering pain (extension, flexion, and rotation)   - 1000 mg x 2Tylenol     ROS:   - Feeling less itchy lately  - Noting some of medications might be helping to reduce the itchness     Past Medical History:  Past Medical History:   Diagnosis Date    Cellulitis and abscess of foot 12/02/2020    Added automatically from request for surgery [id number]    Vitamin D deficiency 02/13/2016    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    Idiopathic peripheral neuropathy 03/15/2015    Spondylosis of cervical region without myelopathy or radiculopathy 05/28/2014    Back pain, lumbosacral 02/15/2013    B12 deficiency 12/21/2012    Borderline low 5 /21/2014    Chronic renal insufficiency, stage III (moderate) (HCC) 12/21/2012    5/ 2013    Hyperlipidemia 09/26/2012    Cervicalgia 03/11/2010    Radiofrequency ablation of c3 to c 7    Allergic rhinitis due to allergen     Carotid Sinus Hypersensitivity     Chronic pain     chronic LUE pain had injection.    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 Hilarie Fredrickson, DPM    PR HALLUX RIGIDUS W/CHEILECTOMY 1ST MP JT W/O IMPLT Right 10/21/2017    Dr. Donn Pierini    PR UNLISTED PROCEDURE FEMUR/KNEE      PR UNLISTED PROCEDURE HANDS/FINGERS      PR UNLISTED PROCEDURE SPINE  2013    rfa to c3 to c 7     SURGICAL HX OTHER Right 12/03/2020    RIGHT foot WOUND INCISION AND DRAINAGE    TOE SURGERY Right 02/09/2019    toe surgery  Right 01/12/2019    Dr Hilarie Fredrickson         Medications:  Outpatient Medications Prior to Visit   Medication Sig Dispense Refill    Acetaminophen 500 MG Oral Tab Take 2 tablets (1,000 mg) by mouth 2 times a day.      Alpha-Lipoic Acid 300 MG tablet Take 1 tablet by mouth every morning. Benfotiamine- Alpha Lipoic acid      amLODIPine 5 MG tablet Take 1 tablet (5 mg) by mouth 2 times a day. 180 tablet 3    atorvastatin 10 MG tablet Take 1 tablet (10 mg) by mouth daily. (Patient taking differently: Take 1 tablet (10 mg) by mouth every morning.) 90 tablet 3    BENFOTIAMINE OR Take 250 mg by  mouth every morning.      betamethasone dipropionate 0.05 % lotion APPLY TO SCALP AND EARS TOPICALLY 2 TIMES A DAY AS NEEDED FOR ITCHING. Notify MD of any signs of infection or skin thinning 60 mL 5    Cholecalciferol (VITAMIN D3) 2000 units Oral Cap Take 1 capsule (2,000 Units) by mouth daily. For low level (Patient taking differently: Take 1 capsule (2,000 units) by mouth every morning. For low level) 1 capsule 1    clindamycin 1 % gel Apply 1 Application  topically 2 times a day as needed. Apply to back.      Cyanocobalamin 1000 MCG Oral Tab one per day over-the-counter started 5/21 /2012 this level borderline low (Patient taking differently: Take 1 tablet (1,000 mcg) by mouth every morning.) 1 Tab 1    cyclobenzaprine 5 MG tablet Take 1 tablet (5 mg) by mouth 3 times a day as needed for muscle spasms. 20 tablet 0    diclofenac 1 % gel Apply 4 g topically 4 times a day. Apply to 2 gram four times daily to neck, trapezius muscle, and low back. Use a max of 32 grams a day. (Patient taking differently: Apply 4 g topically 4 times a day as needed. Apply to 2 gram four times daily to neck, trapezius muscle, and low back. Use a max of 32 grams a day.) 300 g 4    diclofenac sodium 50 MG EC tablet Take 1 tablet by mouth 2 times a day. Take with food. (Patient not taking: Reported on 11/04/2022) 14 tablet 0    DULoxetine 60 MG DR capsule Take 1 capsule (60 mg) by mouth 2 times a day. 180 capsule 2    EPINEPHrine 0.3 MG/0.3ML auto-injector Inject one pen (0.3mg /mL) into the muscle for severe allergic reaction. Call 911. Use second pen if symptoms continue 2 each 0    ibuprofen 200 MG tablet Take 1 tablet (200 mg) by mouth daily as needed (pain).      lidocaine 5 % patch Apply 1 patch onto the skin daily. Apply to painful area for up to 12 hours in  a 24 hour period. (Patient taking differently: Apply 1 patch onto the skin daily as needed. Apply to painful area for up to 12 hours in a 24 hour period.) 30 patch 2    lisinopril  20 MG tablet Take 1 tablet (20 mg) by mouth 2 times a day. 180 tablet 3    mirabegron ER (Myrbetriq) 50 MG 24 hr tablet Take 1 tablet (50 mg) by mouth daily. 100 tablet 3    multivitamin with minerals tablet Take 1 tablet by mouth daily.      naltrexone 50 MG tablet TAKE 1 TABLET BY MOUTH DAILY WITH FOOD 30 tablet 11    Omega-3 Fatty Acids (OMEGA 3 OR) Take 1 tablet by mouth every morning.      pregabalin 300 MG capsule TAKE 1 CAPSULE BY MOUTH TWICE DAILY 180 capsule 0    primidone 50 MG tablet Take 1 tablet (50 mg) by mouth 2 times a day. 180 tablet 4     Facility-Administered Medications Prior to Visit   Medication Dose Route Frequency Provider Last Rate Last Admin    sodium chloride 0.9 % IV Bolus 500 mL  500 mL Intravenous Once Wendi Maya, MD            Allergies:  Review of patient's allergies indicates:  Allergies   Allergen Reactions    Adhesives Skin: Rash     Glue on lido patches    Bee Venom Skin: Hives, Skin: Itching and Swelling    Gabapentin Other     Flu like symptoms, diarrhea, body ache upset stomach    Methocarbamol Other     Patient's wife reports cognitive difficulties ("goofy")        Family/Social history:  family history includes Colon Cancer in his father; Heart (other) in his mother; Other Family Hx in an other family member; Suicide Attempt in his brother. There is no history of Cancer.       OBJECTIVE:  Vitals:  There were no vitals taken for this visit.  Pulse: Thin and deep  Tongue: Deferred      Skin: Red rashes with small push at top over bilateral shoulders noted he got from the lydocaine patch. No pain, no itches. Did not noticed until he looked at the mirros.       Assessment/Plan:    Following an evaluation including the history of present illness/s, medical history and examination, the patient's clinical presentation suggest probable:    (M54.50,  G89.29) Chronic bilateral low back pain without sciatica  (primary encounter diagnosis)      .     Diagnosis According to [place]  Medicine:  Bone Bi, Qi and blood stagnation in UB, GB, and SJ channels.     Treatment Principle/Goals:   Short-term: Decrease discomfort/pain, improve circulation and increase range of motion  Long-term: Minimize probability of future exacerbations.    Acupuncture Treatment:  Verbal consent was obtained from the patient before performing acupuncture and other modalities in this session.     First set of needles: Prone  Face to face: 15  Mins  GB20, DU14, SI11, UB40, UB60  L: UB25, Yaoyan - electro with 2 hz continuous     Needles in: 13  Needles out: 13    Second set of needles: Supine (with 2 pillows behind the neck &  and 3 pillows under his knee)  Face to face: 8 Mins  Mid: YT, YNSA A  RL ST41, HT7, KD3    Needles in: 6  Needles out: 6    Pyonex press needle (0.45mm) were placed at Du 14 and UB 25 bilaterally  Left yaoyan, UB25 (1 cun below) I advised to keep them upto 5 days and take them off. However, if there is any irritability or discomfort felt, then remove them sooner. I also advised to discard the used one safely.     Infrared Therapy (TDP) placed over Feet for 15 minutes.     Post Treatment Note:   Ronnie Mason tolerated the all the treatment well without any complications.      Time Spent:   - Time spent for face-to-face for acupuncture: 23 minutes.      Augusto Gamble, AEMP, DACM, LAc  Licensed Acupuncturist, Paramedic for [employer]  Department of Family medicine  [facility] [place]

## 2022-11-17 ENCOUNTER — Telehealth (INDEPENDENT_AMBULATORY_CARE_PROVIDER_SITE_OTHER): Payer: Self-pay | Admitting: Acupuncturist

## 2022-11-17 NOTE — Telephone Encounter (Signed)
Sent Dr. Louie Casa cancellation information to pt via Mychart message. Also, I called pt's preferred phone number to spoke to pt. Pt has schedule conflict and could not make it.

## 2022-11-20 ENCOUNTER — Ambulatory Visit (HOSPITAL_BASED_OUTPATIENT_CLINIC_OR_DEPARTMENT_OTHER): Payer: Medicare PPO | Admitting: Anesthesiology

## 2022-11-26 ENCOUNTER — Telehealth (INDEPENDENT_AMBULATORY_CARE_PROVIDER_SITE_OTHER): Payer: Self-pay | Admitting: Acupuncturist

## 2022-11-26 ENCOUNTER — Ambulatory Visit (INDEPENDENT_AMBULATORY_CARE_PROVIDER_SITE_OTHER): Payer: Medicare PPO | Admitting: Acupuncturist

## 2022-11-26 DIAGNOSIS — M545 Low back pain, unspecified: Secondary | ICD-10-CM

## 2022-11-26 DIAGNOSIS — G8929 Other chronic pain: Secondary | ICD-10-CM

## 2022-11-26 DIAGNOSIS — M5459 Other low back pain: Secondary | ICD-10-CM

## 2022-11-26 NOTE — Telephone Encounter (Addendum)
Dr. Fredric Mare has a cancellation today @ 1:30 pm, so I called pt's preferred phone number and left message to inform this cancellation information.

## 2022-11-26 NOTE — Telephone Encounter (Signed)
Dr. Fredric Mare has anther cancellation on Thursday, 12/03/22 @ 4 pm, so I sent Mychart message to pt.

## 2022-11-26 NOTE — Progress Notes (Addendum)
[place] [facility]    Acupuncture Visit (#6 in 2024)      Referred by: Drexel Iha, MD     PCP: [name], MD    HPI: Ronnie Mason was referred to acupuncture and [place] medicine for consultation and treatment of     Chief Complaint   Patient presents with    Acupuncture     Chronic Lower back pain     Ronnie Mason noted he started a new set of medication (diclofenac and muscle relaxer) and it has been helping to reduce the pain significantly.     CC1: Chronic Lower back pain  - Location: Bilateral lower back,  - Pain scale today: for LBP 3-4/10,   - Location: Yaoayn area radiates up and down on his back  - Last two weeks he was able walk around without experiencing much pain  - Had one day with back spasm during and after doing some yard work on his property. The spasm resolved over night.   - No spam today, but every other day still experience leg spasm.   - Going to see a doctor specialize the back pain next Friday  - Using tends unit at home and seems to be helping.     Related issue: Neck pain  - Doing well  - Pain scale today: 2-3/10   - Still able to move neck movement without triggering pain (extension, flexion, and rotation)   - 1000 mg x 2 Tylenol along with other pain medication and muscle relaxer.       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    Idiopathic peripheral neuropathy 03/15/2015    Spondylosis of cervical region without myelopathy or radiculopathy 05/28/2014    Back pain, lumbosacral 02/15/2013    B12  deficiency 12/21/2012    Borderline low 5 /21/2014    Chronic renal insufficiency, stage III (moderate) (HCC) 12/21/2012    5/ 2013    Hyperlipidemia 09/26/2012    Cervicalgia 03/11/2010    Radiofrequency ablation of c3 to c 7    Allergic rhinitis due to allergen     Carotid Sinus Hypersensitivity     Chronic pain     chronic LUE pain had injection.    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 Hilarie Fredrickson, DPM    PR HALLUX RIGIDUS W/CHEILECTOMY 1ST MP JT W/O IMPLT Right 10/21/2017    Dr. Donn Pierini  PR UNLISTED PROCEDURE FEMUR/KNEE      PR UNLISTED PROCEDURE HANDS/FINGERS      PR UNLISTED PROCEDURE SPINE  2013    rfa to c3 to c 7     SURGICAL HX OTHER Right 12/03/2020    RIGHT foot WOUND INCISION AND DRAINAGE    TOE SURGERY Right 02/09/2019    toe surgery  Right 01/12/2019    Dr Hilarie Fredrickson         Medications:  Outpatient Medications Prior to Visit   Medication Sig Dispense Refill    Acetaminophen 500 MG Oral Tab Take 2 tablets (1,000 mg) by mouth 2 times a day.      Alpha-Lipoic Acid 300 MG tablet Take 1 tablet by mouth every morning. Benfotiamine- Alpha Lipoic acid      amLODIPine 5 MG tablet Take 1 tablet (5 mg) by mouth 2 times a day. 180 tablet 3    atorvastatin 10 MG tablet Take 1 tablet (10 mg) by mouth daily. (Patient taking differently: Take 1 tablet (10 mg) by mouth every morning.) 90 tablet 3    BENFOTIAMINE OR Take 250 mg by mouth every morning.      betamethasone dipropionate 0.05 % lotion APPLY TO SCALP AND EARS TOPICALLY 2 TIMES A DAY AS NEEDED FOR ITCHING. Notify MD of any signs of infection or skin thinning 60 mL 5     Cholecalciferol (VITAMIN D3) 2000 units Oral Cap Take 1 capsule (2,000 Units) by mouth daily. For low level (Patient taking differently: Take 1 capsule (2,000 units) by mouth every morning. For low level) 1 capsule 1    clindamycin 1 % gel Apply 1 Application  topically 2 times a day as needed. Apply to back.      Cyanocobalamin 1000 MCG Oral Tab one per day over-the-counter started 5/21 /2012 this level borderline low (Patient taking differently: Take 1 tablet (1,000 mcg) by mouth every morning.) 1 Tab 1    cyclobenzaprine 5 MG tablet Take 1 tablet (5 mg) by mouth 3 times a day as needed for muscle spasms. 20 tablet 0    diclofenac 1 % gel Apply 4 g topically 4 times a day. Apply to 2 gram four times daily to neck, trapezius muscle, and low back. Use a max of 32 grams a day. (Patient taking differently: Apply 4 g topically 4 times a day as needed. Apply to 2 gram four times daily to neck, trapezius muscle, and low back. Use a max of 32 grams a day.) 300 g 4    diclofenac sodium 50 MG EC tablet Take 1 tablet by mouth 2 times a day. Take with food. (Patient not taking: Reported on 11/04/2022) 14 tablet 0    DULoxetine 60 MG DR capsule Take 1 capsule (60 mg) by mouth 2 times a day. 180 capsule 2    EPINEPHrine 0.3 MG/0.3ML auto-injector Inject one pen (0.3mg /mL) into the muscle for severe allergic reaction. Call 911. Use second pen if symptoms continue 2 each 0    ibuprofen 200 MG tablet Take 1 tablet (200 mg) by mouth daily as needed (pain).      lidocaine 5 % patch Apply 1 patch onto the skin daily. Apply to painful area for up to 12 hours in a 24 hour period. (Patient taking differently: Apply 1 patch onto the skin daily as needed. Apply to painful area for up to 12 hours in a 24 hour period.) 30 patch 2    lisinopril 20 MG tablet Take 1  tablet (20 mg) by mouth 2 times a day. 180 tablet 3    mirabegron ER (Myrbetriq) 50 MG 24 hr tablet Take 1 tablet (50 mg) by mouth daily. 100 tablet 3    multivitamin with minerals  tablet Take 1 tablet by mouth daily.      naltrexone 50 MG tablet TAKE 1 TABLET BY MOUTH DAILY WITH FOOD 30 tablet 11    Omega-3 Fatty Acids (OMEGA 3 OR) Take 1 tablet by mouth every morning.      pregabalin 300 MG capsule TAKE 1 CAPSULE BY MOUTH TWICE DAILY 180 capsule 0    primidone 50 MG tablet Take 1 tablet (50 mg) by mouth 2 times a day. 180 tablet 4     Facility-Administered Medications Prior to Visit   Medication Dose Route Frequency Provider Last Rate Last Admin    sodium chloride 0.9 % IV Bolus 500 mL  500 mL Intravenous Once Wendi Maya, MD            Allergies:  Review of patient's allergies indicates:  Allergies   Allergen Reactions    Adhesives Skin: Rash     Glue on lido patches    Bee Venom Skin: Hives, Skin: Itching and Swelling    Gabapentin Other     Flu like symptoms, diarrhea, body ache upset stomach    Methocarbamol Other     Patient's wife reports cognitive difficulties ("goofy")        Family/Social history:  family history includes Colon Cancer in his father; Heart (other) in his mother; Other Family Hx in an other family member; Suicide Attempt in his brother. There is no history of Cancer.       OBJECTIVE:  Vitals:  There were no vitals taken for this visit.  Pulse: Thin and deep  Tongue: Red tongue body with back coating in the middle jiao region    Palpation: tight to palpate at L1-L5 paraspinal region.    Assessment/Plan:    Following an evaluation including the history of present illness/S, medical history and examination, the patient's clinical presentation suggest probable:    (M54.50,  G89.29) Chronic bilateral low back pain without sciatica  (primary encounter diagnosis)    .     Diagnosis According to [place] Medicine:  Bone Bi, Qid and blood stagnation in KUB, GB, and S channels.     Treatment Principle/Goals:   Short-term: Decrease discomfort/pain, improve circulation and increase range of motion  Long-term: Minimize probability of future exacerbations.    Acupuncture  Treatment:  Verbal consent was obtained from the patient before performing acupuncture and other modalities in this session.     Second set of needles: Supine (with 2 pillows behind the neck &  and 3 pillows under his knee)  Face to face: 15 Mins  Mid: YT, YNSA D1, DU20, DU21  BL: GB34, GB41  L: HT7, UB40 form the side    Needles in: 10  Needles out: 10    First set of needles: Seating position  Face to face: 8 Mins  DU14,  BL: UB25, Yaoyan - electro with 2 hz continuous     Needles in: 5  Needles out: 5    Pyonex press needle (0.10mm) were placed at Du 14 and  bilaterally HTJJ L2, L4, L5 I advised to keep them upto 5 days and take them off. However, if there is any irritability or discomfort felt, then remove them sooner. I also advised to discard the used one safely.  Post Treatment Note:   Ronnie Mason tolerated the all the treatment well without any complications.      Time Spent:   - Time spent for face-to-face for acupuncture: 23 minutes.    Note:   I shared the information about the Dr. Algis Greenhouse and Dr. Bevelyn Buckles Chronic Pain Management Skill Class. Ronnie Mason noted he would be interested in taking the class.  I let Ronnie Mason know that I will let them know that he is interested in joining.       Augusto Gamble, AEMP, DACM, LAc  Licensed Acupuncturist, Paramedic for [employer]  Department of Family medicine  [place] [facility] [place]

## 2022-11-27 ENCOUNTER — Encounter (INDEPENDENT_AMBULATORY_CARE_PROVIDER_SITE_OTHER): Payer: Self-pay | Admitting: Clinical

## 2022-11-27 MED ORDER — DEXAMETHASONE SODIUM PHOSPHATE 10 MG/ML IJ SOLN
10.0000 mg | Freq: Once | INTRAMUSCULAR | Status: DC
Start: 2022-11-27 — End: 2022-12-04

## 2022-11-27 MED ORDER — IOHEXOL 240 MG/ML IJ SOLN
10.0000 mL | Freq: Once | INTRAMUSCULAR | Status: DC
Start: 2022-11-27 — End: 2022-12-04

## 2022-11-27 NOTE — Progress Notes (Signed)
PROCEDURE NOTE CERVICOTHORACIC INTERLAMINAR EPIDURAL INJECTION  12/04/2022    PROCEDURE  Interlaminar cervical epidural steroid injection, C7-T1    PRE-PROCEDURE DIAGNOSIS  (M54.12) Cervical radiculopathy, chronic  (primary encounter diagnosis).    POST-PROCEDURE DIAGNOSIS  (M54.12) Cervical radiculopathy, chronic  (primary encounter diagnosis).    INDICATION  Ronnie Mason has clinical and imaging findings consistent with possible cervical radiculopathy and has failed efforts at conservative care.  .    ATTENDING PHYSICIAN:  [name] MD    FELLOW / RESIDENT Ronnie Melter MD    SEDATION  Yes    NPO STATUS  No    INTRAVENOUS LINE  No    OPERATIVE PROCEDURE  Final verification was performed prior to beginning the procedure.  Ronnie Mason was brought to the procedure room and placed on the exam table in a prone position, with the head flexed. The sterile field was prepped in a sterile fashion with ChloraPrep.  Sterile drapes applied.   Fluoroscopic guidance was used for this procedure.  Appropriate images were reviewed and saved.  C7-T1  interspace was visualized by anteroposterior fluoroscopic image.   Local anesthesia was provided by infiltration of 1% lidocaine. An 18-gauge 3.5 inch epidural  needle was placed below the superior laminar edge for a Right paramedian approach. Using fluoroscopic guidance, the needle was advanced into the intralaminar window. Repeated multiplanar images were done to prevent excessively deep insertion of the needle. The loss of resistance to saline technique was used as the initial method of proper needle placement. Attempted aspiration yielded no blood or cerebrospinal fluid.     Once in position  Omnipaque-240 (total volume <2 ml) was injected under live fluoroscopy demonstrating epidural spread and no vascular uptake.  Contralateral oblique view confirmed proper epidural spread.Once the epidural space was accessed, Versacath catheter was deployed and threaded upwards towards C5.  Patient felt paresthesias prompting movement and catheter was positioned towards left rather than right so catheter was removed and replaced. With attempted redirection paresthesias into right shoulder occurred again prompting movement. Catheter was retracted. Contrast media was injected to confirm placement of needle still in epidural space which is was confirmed to be. At that point chose to not re-introduce catheter out of concern of provoking wet tap with continued patient movement and medication (steroid and saline) was injected.     Of note, when catheter was withdrawn at the end it was noted the that metal coil of the catheter had separated from the plastic catheter tip which was concerning for risk of shearing. No evidence of retained catheter as it appear the entire catheter and wire was removed, though not intact. Our suspicious is that movement of the patient while the catheter was deployed may have moved the needle slightly and damaged the catheter    Injectate: Dexamethasone 10 mg with preservative free normal saline x 2 mL were injected into the epidural space. The catheter was withdrawn uneventfully.  The needle was withdrawn, additional local anesthetic was injected along the track as it was removed.     POST-PROCEDURE  Ronnie Mason  was transported to the recovery room for observation where he did make an uneventful recovery.     COMPLICATIONS  None. However, there was significant patient movement throughout the procedure causing [place] to nearly abort the procedure multiple times. Future procedures may need to be done with IV sedation for safety.    PLAN  Pain diary.  Next steps in his care depend on the response to today's procedure.  Follow  up with Dr. Araceli Bouche .    Ronnie Melter, MD

## 2022-11-30 ENCOUNTER — Other Ambulatory Visit (INDEPENDENT_AMBULATORY_CARE_PROVIDER_SITE_OTHER): Payer: Self-pay | Admitting: Internal Medicine

## 2022-11-30 DIAGNOSIS — M792 Neuralgia and neuritis, unspecified: Secondary | ICD-10-CM

## 2022-12-01 MED ORDER — PREGABALIN 300 MG OR CAPS
300.0000 mg | ORAL_CAPSULE | Freq: Two times a day (BID) | ORAL | 0 refills | Status: DC
Start: 2022-12-01 — End: 2023-02-27

## 2022-12-01 NOTE — Telephone Encounter (Signed)
Let patient know script filled  Please do telehealth prior to next refill

## 2022-12-02 NOTE — Telephone Encounter (Signed)
Routing to [employer], Please assist with informing Pt refill and to schedule per providers note

## 2022-12-02 NOTE — Telephone Encounter (Signed)
Sent message via MyChart informing Pt of refill and that prior to the next fill, telemed is due.      Scheduling Ticket Sent.     CCRs please assist with scheduling. Thank you.    Postponing 48 hours to allow patient time to read and respond and for a 2nd attempt

## 2022-12-03 ENCOUNTER — Ambulatory Visit (INDEPENDENT_AMBULATORY_CARE_PROVIDER_SITE_OTHER): Payer: Medicare PPO | Admitting: Acupuncturist

## 2022-12-03 ENCOUNTER — Telehealth (INDEPENDENT_AMBULATORY_CARE_PROVIDER_SITE_OTHER): Payer: Self-pay | Admitting: Acupuncturist

## 2022-12-03 DIAGNOSIS — G8929 Other chronic pain: Secondary | ICD-10-CM

## 2022-12-03 DIAGNOSIS — M545 Low back pain, unspecified: Secondary | ICD-10-CM

## 2022-12-03 DIAGNOSIS — M5459 Other low back pain: Secondary | ICD-10-CM

## 2022-12-03 NOTE — Progress Notes (Signed)
[place] [facility]    Acupuncture Visit (#7 in 2024)      Referred by: Drexel Iha, MD     PCP: [name], MD    HPI: Ronnie Mason was referred to acupuncture and [place] medicine for consultation and treatment of     Chief Complaint   Patient presents with    Acupuncture     Chronic Lower back pain     Ronnie Mason noted he started a new set of medication (diclofenac and muscle relaxer) and it has been helping to reduce the pain significantly.     CC1: Chronic Lower back pain  - Location: Bilateral lower back,  - Pain scale today: for LBP 2-3/10  - Hasn't used cane for few days but carry with him just in case  - Location: [place] area radiates up and down on his back, but less  - No spam today, but every other day still experience leg spasm.   - Going to see a doctor specialize the back pain tomorrow for the epidural procedure.   - Using tends unit at home and seems to be helping.     Related issue: Neck pain  - Pain scale today: 7-8/10  - Started hurting when he turned the head.   - Does not wakes him up if neck is stiff  - Twisting the neck either side, when drive needs to move whole body around  - Paining the bathroom with arms up and looking up was too painful and needed to stop.  - Taking higher dosage of Tylenol  (1300 mg per day) which is Tylenol for arthritis for last few weeks.     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    Idiopathic peripheral neuropathy 03/15/2015     Spondylosis of cervical region without myelopathy or radiculopathy 05/28/2014    Back pain, lumbosacral 02/15/2013    B12 deficiency 12/21/2012    Borderline low 5 /21/2014    Chronic renal insufficiency, stage III (moderate) (HCC) 12/21/2012    5/ 2013    Hyperlipidemia 09/26/2012    Cervicalgia 03/11/2010    Radiofrequency ablation of c3 to c 7    Allergic rhinitis due to allergen     Carotid Sinus Hypersensitivity     Chronic pain     chronic LUE pain had injection.    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 Hilarie Fredrickson, DPM    PR HALLUX RIGIDUS W/CHEILECTOMY 1ST MP JT W/O IMPLT Right 10/21/2017    Dr. Donn Pierini    PR UNLISTED PROCEDURE FEMUR/KNEE      PR UNLISTED PROCEDURE HANDS/FINGERS      PR UNLISTED PROCEDURE SPINE  2013    rfa to c3 to c 7     SURGICAL HX OTHER Right 12/03/2020    RIGHT foot WOUND INCISION AND DRAINAGE    TOE SURGERY Right 02/09/2019    toe surgery  Right 01/12/2019    Dr Hilarie Fredrickson         Medications:  Outpatient Medications Prior to Visit   Medication Sig Dispense Refill    Acetaminophen 500 MG Oral Tab Take 2 tablets (1,000 mg) by mouth 2 times a day.      Alpha-Lipoic Acid 300 MG tablet Take 1 tablet by mouth every morning. Benfotiamine- Alpha Lipoic acid      amLODIPine 5 MG tablet Take 1 tablet (5 mg) by mouth 2 times a day. 180 tablet 3    atorvastatin 10 MG tablet Take 1 tablet (10 mg) by mouth daily. (Patient taking differently: Take 1 tablet (10 mg) by mouth every morning.) 90 tablet 3    BENFOTIAMINE OR Take 250 mg by mouth every morning.      betamethasone dipropionate 0.05 % lotion APPLY TO SCALP AND  EARS TOPICALLY 2 TIMES A DAY AS NEEDED FOR ITCHING. Notify MD of any signs of infection or skin thinning 60 mL 5    Cholecalciferol (VITAMIN D3) 2000 units Oral Cap Take 1 capsule (2,000 Units) by mouth daily. For low level (Patient taking differently: Take 1 capsule (2,000 units) by mouth every morning. For low level) 1 capsule 1    clindamycin 1 % gel Apply 1 Application  topically 2 times a day as needed. Apply to back.      Cyanocobalamin 1000 MCG Oral Tab one per day over-the-counter started 5/21 /2012 this level borderline low (Patient taking differently: Take 1 tablet (1,000 mcg) by mouth every morning.) 1 Tab 1    cyclobenzaprine 5 MG tablet Take 1 tablet (5 mg) by mouth 3 times a day as needed for muscle spasms. 20 tablet 0    diclofenac 1 % gel Apply 4 g topically 4 times a day. Apply to 2 gram four times daily to neck, trapezius muscle, and low back. Use a max of 32 grams a day. (Patient taking differently: Apply 4 g topically 4 times a day as needed. Apply to 2 gram four times daily to neck, trapezius muscle, and low back. Use a max of 32 grams a day.) 300 g 4    diclofenac sodium 50 MG EC tablet Take 1 tablet by mouth 2 times a day. Take with food. (Patient not taking: Reported on 11/04/2022) 14 tablet 0    DULoxetine 60 MG DR capsule Take 1 capsule (60 mg) by mouth 2 times a day. 180 capsule 2    EPINEPHrine 0.3 MG/0.3ML auto-injector Inject one pen (0.3mg /mL) into the muscle for severe allergic reaction. Call 911. Use second pen if symptoms continue 2 each 0    ibuprofen 200 MG tablet Take 1 tablet (200 mg) by mouth daily as needed (pain).      lidocaine 5 % patch Apply 1 patch onto the skin daily. Apply to painful area for up to 12 hours in a 24 hour period. (Patient taking differently:  Apply 1 patch onto the skin daily as needed. Apply to painful area for up to 12 hours in a 24 hour period.) 30 patch 2    lisinopril 20 MG tablet Take 1 tablet (20 mg) by mouth 2 times a day. 180 tablet 3    mirabegron  ER (Myrbetriq) 50 MG 24 hr tablet Take 1 tablet (50 mg) by mouth daily. 100 tablet 3    multivitamin with minerals tablet Take 1 tablet by mouth daily.      naltrexone 50 MG tablet TAKE 1 TABLET BY MOUTH DAILY WITH FOOD 30 tablet 11    Omega-3 Fatty Acids (OMEGA 3 OR) Take 1 tablet by mouth every morning.      pregabalin 300 MG capsule TAKE 1 CAPSULE BY MOUTH TWICE DAILY 180 capsule 0    primidone 50 MG tablet Take 1 tablet (50 mg) by mouth 2 times a day. 180 tablet 4     Facility-Administered Medications Prior to Visit   Medication Dose Route Frequency Provider Last Rate Last Admin    sodium chloride 0.9 % IV Bolus 500 mL  500 mL Intravenous Once Wendi Maya, MD            Allergies:  Review of patient's allergies indicates:  Allergies   Allergen Reactions    Adhesives Skin: Rash     Glue on lido patches    Bee Venom Skin: Hives, Skin: Itching and Swelling    Gabapentin Other     Flu like symptoms, diarrhea, body ache upset stomach    Methocarbamol Other     Patient's wife reports cognitive difficulties ("goofy")        Family/Social history:  family history includes Colon Cancer in his father; Heart (other) in his mother; Other Family Hx in an other family member; Suicide Attempt in his brother. There is no history of Cancer.       OBJECTIVE:  Vitals:  There were no vitals taken for this visit.  Pulse: Thin and deep  Tongue: Red tongue body with back coating in the middle jiao region    Palpation: tight to palpate at L1-L5 paraspinal region.    Assessment/Plan:    Following an evaluation including the history of present illness/S, medical history and examination, the patient's clinical presentation suggest probable:    (M54.50,  G89.29) Chronic bilateral low back pain, unspecified whether sciatica present  (primary encounter diagnosis)      .     Diagnosis According to [place] [facility]:  Bone Bi, Qid and blood stagnation in KUB, GB, and S channels.     Treatment Principle/Goals:   Short-term: Decrease  discomfort/pain, improve circulation and increase range of motion  Long-term: Minimize probability of future exacerbations.    Acupuncture Treatment:  Verbal consent was obtained from the patient before performing acupuncture and other modalities in this session.     Second set of needles: Supine (with 2 pillows behind the neck &  and 3 pillows under his knee)  Face to face: 15 Mins  Mid: KD3, KD7, GB39 GB34  DU24  R: SJ8,   L: HT7,     Needles in: 10  Needles out: 10    First set of needles: Side position  Face to face: 8 Mins  DU14,  R: GB12  BL: UB25, Yaoyan - electro with 2 hz continuous     Needles in: 6  Needles out: 6    Pyonex press needle (0.20mm) were placed at Du 14 and  bilaterally  SJ15, HTJJ L1, L4,L5 I advised to keep them upto 5 days and take them off. However, if there is any irritability or discomfort felt, then remove them sooner. I also advised to discard the used one safely.     Post Treatment Note:   Ronnie Mason tolerated the all the treatment well without any complications.      Time Spent:   - Time spent for face-to-face for acupuncture: 23 minutes.      Augusto Gamble, AEMP, DACM, LAc  Licensed Acupuncturist, Paramedic for [employer]  Department of Family medicine  [place] [facility] [place]

## 2022-12-03 NOTE — Telephone Encounter (Signed)
Dr. Fredric Mare is not available on 04/02/23, so I called pt's preferred phone number and spoke to pt. I informed this information and asked pt to reschedule to week after 04/02/23. Pt agreed and rescheduled on Friday, 04/09/23 @ 11 am.

## 2022-12-04 ENCOUNTER — Ambulatory Visit: Payer: Medicare PPO | Attending: Anesthesiology | Admitting: Anesthesiology

## 2022-12-04 ENCOUNTER — Ambulatory Visit (HOSPITAL_BASED_OUTPATIENT_CLINIC_OR_DEPARTMENT_OTHER): Payer: Medicare PPO | Admitting: Anesthesiology

## 2022-12-04 VITALS — BP 119/79 | HR 78 | Temp 97.1°F | Resp 16

## 2022-12-04 DIAGNOSIS — M5412 Radiculopathy, cervical region: Secondary | ICD-10-CM | POA: Insufficient documentation

## 2022-12-04 MED ORDER — DEXAMETHASONE SODIUM PHOSPHATE 10 MG/ML IJ SOLN
10.0000 mg | Freq: Once | INTRAMUSCULAR | Status: AC
Start: 2022-12-04 — End: 2022-12-04
  Administered 2022-12-04: 10 mg

## 2022-12-04 MED ORDER — DEXAMETHASONE SOD PHOSPHATE PF 10 MG/ML IJ SOLN
INTRAMUSCULAR | Status: AC
Start: 2022-12-04 — End: 2022-12-04
  Filled 2022-12-04: qty 1

## 2022-12-04 MED ORDER — IOHEXOL 240 MG/ML IJ SOLN
10.0000 mL | Freq: Once | INTRAMUSCULAR | Status: AC
Start: 2022-12-04 — End: 2022-12-04
  Administered 2022-12-04: 10 mL via EPIDURAL

## 2022-12-04 NOTE — Telephone Encounter (Signed)
PT read message on 12/03/2022. Nothing Further Needed    Closing Encounter

## 2022-12-04 NOTE — Progress Notes (Signed)
I was present for the entire procedure (cervical epidural steroid injection) which was performed under my direct personal supervision.   I reviewed the documentation of the other providers and concur with Dr. Larina Bras findings. I edited the procedure note.      Referrals to PT and acupuncture placed.    [name], MD  Medical Director   [facility]  [facility]   Department of Anesthesiology and Pain Medicine

## 2022-12-04 NOTE — Progress Notes (Signed)
[name] identified by name and DOB.   He confirmed he is having a C7-T1 ILESI  He was admitted to procedure area with steady gait.  He has not fallen in the last 6 months.  Fall prevention interventions: Patient provided with non-skid stockings  He denies taking any blood thinners.  He denies having a bleeding disorder.  He  Has held his medications per provider instructions.  He has not been on antibiotics for the past two weeks.   He denies a history of fainting during medical procedures.   He has not been NPO.  He undressed self without nursing assistance.  Is He receiving a steroid injection today? yes  If yes, has He received the COVID-19 vaccine (either brand) within the past 2 weeks? no  OR does He plan to receive the COVID-19 vaccine (either brand) within the next 2 weeks?no  PIV inserted   I verbally reviewed written discharge instructions and pain diary with him.   New consent on chart  Confirmed ride home with his spouse, Ronnie Mason, at bedside     He has a pre-procedure pain score of 7, radiates bl

## 2022-12-04 NOTE — Progress Notes (Signed)
Pain score prior to procedure:  7/10  Pain score after procedure:  5/10    Mr. Finkel met discharge criteria:  A/OX4/baseline, VSS/returned to baseline. Mobility returned to baseline prior to discharge. Procedure site clean dry and intact.  Written and verbal discharge instructions, including emergency contact phone numbers, reviewed with Mr. Oser. Verbal understanding obtained from him.  PIV removed, catheter intact, no redness or swelling at site.   Mr. Barnhard dressed self without nursing assistance. He was discharged in stable condition from procedure area with steady gait. to home with all belongings and with ride/responsible person.

## 2022-12-04 NOTE — Patient Instructions (Signed)
[facility]  Post-Procedure Discharge Instructions      After the procedure:  Immediate and complete pain relief is rare  Numbness and/or weakness in the area of your body supplied by the injected nerve; these symptoms should resolve but may last up to several hours  Some soreness and bruising at the injection site(s)    Activities:  If you have any weakness or numbness caused by the injection, DO NOT DRIVE or operate machinery and limit other activity until sensation returns to normal.  You may resume regular exercises/activities as tolerated.  If you received sedation, DO NOT DRIVE or operate machinery for 24 hours.    Medications:  If you stopped taking any blood thinning medications such as Coumadin or Plavix, you may resume these tomorrow unless specified differently by the prescribing physician.    Site care:  You may remove the band-aid after 6 hours.  You may shower today. No swimming, tub baths or hot tubs for 24 hours following your procedure.  For the first 48 hours, apply ice packs to the injection site for 15-20 minutes hourly as needed for comfort.  Wrap a light towel or cloth around ice packs and heating pad to protect the skin.  After 48 hours, use a warm heating pad to the injection site for 15-20 minutes hourly as needed for comfort.    If you received steroids today:  Steroid medications may cause facial flushing, occasional low-grade fevers, hiccups, insomnia, headaches, water retention, increased appetite, increased heart rate, and abdominal cramping or bloating. These side effects occur in only about 5 percent of patients and commonly disappear within one to three days after the injection.  If you are diabetic check your blood sugar more frequently than usual as you may develop an increase in blood sugar for the next 10-14 days. Contact your diabetes physician if this occurs.    Call us if you develop any of the following symptoms in the next 7 days:  Fever above 100 degrees  F     Any unusual increase in your level of pain  Swelling, bleeding, redness, or increased tenderness at the procedure or IV site  Headache not relieved by Tylenol (if you had an epidural steroid injection)  .    Contact us:  Anytime, 24 hours/day, 7 days/week at [facility]  Patient Self-Administered Pain Diary  1 month        The procedure you just had was done in hopes of providing long term pain relief. Depending on the type of procedure you had, you will need to answer the questions below 1 month after your procedure.  Accurate completion and timely reporting of your pain diary(s) enables your Pain MD to review the results and make further treatment recommendations.    1 month after your procedure, write your answers to the 4 questions below and send your pain score diary to [place] either by:      1)  Sending a picture via eCare    2)  Faxing to [phone]  3)  Calling the pain score voicemail line at [phone].  Leave the spelling of your first and last name, U#, date of birth and date of procedure and the answers to all of the 4 questions below.             Use this scale to rate your pain  0 $'1 2 3 4 5 6 7 8 'P$ 9  10  No pain                      worst pain        Procedure Date: 12/04/22    1.  Pain score immediately before procedure: __________    2. 1 month after your procedure, do you feel better?  YES/NO    3.  If you answered YES to the above, by what percentage__________% (0-100%) has your pain been relieved.    4.  Pain Score now: _________ (at time of telephone call)

## 2022-12-08 ENCOUNTER — Telehealth (INDEPENDENT_AMBULATORY_CARE_PROVIDER_SITE_OTHER): Payer: Self-pay | Admitting: Acupuncturist

## 2022-12-08 NOTE — Telephone Encounter (Addendum)
Sent Dr. Louie Casa cancellation information to pt via Mychart message. I called pt's preferred phone number and spoke to pt. Dr. Fredric Mare has a cancellation on this Thursday 12/10/22 @ 1 pm, but pt has schedule conflict, so he could not make it.

## 2022-12-14 ENCOUNTER — Telehealth (INDEPENDENT_AMBULATORY_CARE_PROVIDER_SITE_OTHER): Payer: Self-pay | Admitting: Acupuncturist

## 2022-12-14 NOTE — Telephone Encounter (Signed)
Dr. Fredric Mare has a couple cancellations, so I called pt's preferred phone number and spoke to pt. Pt scheduled this Friday, 12/18/22 @ 1:30 pm acupuncture appointment. Also, we have more cancellation, so pt will check his calendar to call us back tomorrow.

## 2022-12-17 ENCOUNTER — Encounter (INDEPENDENT_AMBULATORY_CARE_PROVIDER_SITE_OTHER): Payer: Self-pay | Admitting: Clinical

## 2022-12-18 ENCOUNTER — Ambulatory Visit (INDEPENDENT_AMBULATORY_CARE_PROVIDER_SITE_OTHER): Payer: Medicare PPO | Admitting: Acupuncturist

## 2022-12-18 ENCOUNTER — Ambulatory Visit (INDEPENDENT_AMBULATORY_CARE_PROVIDER_SITE_OTHER): Payer: Medicare PPO | Admitting: Clinical

## 2022-12-20 ENCOUNTER — Other Ambulatory Visit (INDEPENDENT_AMBULATORY_CARE_PROVIDER_SITE_OTHER): Payer: Self-pay | Admitting: Internal Medicine

## 2022-12-20 ENCOUNTER — Ambulatory Visit (INDEPENDENT_AMBULATORY_CARE_PROVIDER_SITE_OTHER): Payer: Medicare PPO | Admitting: Family Practice

## 2022-12-20 ENCOUNTER — Inpatient Hospital Stay (INDEPENDENT_AMBULATORY_CARE_PROVIDER_SITE_OTHER): Admit: 2022-12-20 | Payer: Medicare PPO

## 2022-12-20 VITALS — BP 149/84 | HR 79 | Temp 97.7°F | Resp 16

## 2022-12-20 DIAGNOSIS — Z8679 Personal history of other diseases of the circulatory system: Secondary | ICD-10-CM

## 2022-12-20 DIAGNOSIS — I10 Essential (primary) hypertension: Secondary | ICD-10-CM

## 2022-12-20 DIAGNOSIS — J40 Bronchitis, not specified as acute or chronic: Secondary | ICD-10-CM

## 2022-12-20 DIAGNOSIS — J988 Other specified respiratory disorders: Secondary | ICD-10-CM

## 2022-12-20 DIAGNOSIS — R051 Acute cough: Secondary | ICD-10-CM

## 2022-12-20 DIAGNOSIS — R062 Wheezing: Secondary | ICD-10-CM

## 2022-12-20 MED ORDER — DOXYCYCLINE MONOHYDRATE 100 MG OR CAPS
100.0000 mg | ORAL_CAPSULE | Freq: Two times a day (BID) | ORAL | 0 refills | Status: AC
Start: 2022-12-20 — End: 2022-12-27

## 2022-12-20 MED ORDER — ALBUTEROL SULFATE (2.5 MG/3ML) 0.083% IN NEBU
2.5000 mg | INHALATION_SOLUTION | Freq: Once | RESPIRATORY_TRACT | Status: AC
Start: 2022-12-20 — End: 2022-12-20
  Administered 2022-12-20: 2.5 mg via RESPIRATORY_TRACT

## 2022-12-20 MED ORDER — ALBUTEROL SULFATE HFA 108 (90 BASE) MCG/ACT IN AERS
2.0000 | INHALATION_SPRAY | RESPIRATORY_TRACT | 0 refills | Status: DC | PRN
Start: 2022-12-20 — End: 2023-04-07

## 2022-12-20 NOTE — Patient Instructions (Addendum)
1) Bronchitis / wheezing associated respiratory illness  - doxycycline 100mg  by mouth every 12 hours with food for 7 days  - albuterol 2 puffs via spacer (1 puf every 4 hours scheduled for next 3 days, then every 4 hours as needed for cough, wheezing shortness of breath, tightness  - return to Urgent Care if not improving somewhat in next 3 days or if not resolved in next 7 days, sooner anytime if worsening    Thurston Hole, MD    How to use an inhaler - with spacer    Metered-dose inhaler (MDI) administration - with spacer    Getting Ready    If you have not used the inhaler in a while, you may need to prime it. See the instructions that came with your inhaler for how to do this.  Take the cap off the inhaler and spacer.  Shake the inhaler hard 10 to 15 times before each use.  Attach the spacer to the inhaler.  Breathe out gently to empty your lungs. Try to push out as much air as you can.    Breathe in Slowly    Put the spacer between your teeth and close your lips tightly around it.  Keep your chin up.  Start breathing in slowly through your mouth.  Spray one puff into the spacer by pressing down on the inhaler.  Keep breathing in slowly. Breathe as deeply as you can.    Hold Your Breath    Take the spacer out of your mouth.  Hold your breath as you count to 10, if you can. This lets the medicine reach deep into your lungs.  Pucker your lips and slowly breathe out through your mouth.  If you are using inhaled, quick-relief medicine (beta-agonists), wait about 1 to 2 minutes before you take your next puff. You do not need to wait between puffs for other medicines.    Put the caps back on the inhaler and spacer.  After using your inhaler, rinse your mouth with water, gargle, and spit. Do not swallow the water. This helps reduce side effects from your medicine.

## 2022-12-20 NOTE — Result Encounter Note (Signed)
Patient being treated for bronchitis.  Findings were discussed during clinic visit

## 2022-12-20 NOTE — Progress Notes (Signed)
Nebulizer Therapy     While in clinic, patient received nebulized dose of albuterol. See MAR for details.    Post-treatment evaluation: Mostly improved global wheezing and rhonchi

## 2022-12-20 NOTE — Progress Notes (Signed)
Medication Administrations This Visit         albuterol (2.5 MG/3ML) 0.083% nebulizer solution 2.5 mg Admin Date  12/20/2022  13:14 Action  Given Dose  2.5 mg Route  Nebulization Site   Documented By  Devin Going, CMA    Ordering Provider: Thurston Hole, MD    NDC: [phone]    Lot#: [id number]

## 2022-12-20 NOTE — Progress Notes (Signed)
Subjective:   Ronnie Mason is a 72 year old male presenting for   Chief Complaint   Patient presents with    Cough     Productive cough x 1 week  SOB   Able to walk about 10 feet then has to rest   Poss fever  Sweaty   COVID test 1 week ago NEG        Patient notes cough productive of thick yellow sputum x 1 week.  Similar feeling to when he had pneumonia previously notes shortness of breath with exertion.  Feels feverish, sweaty with chills.  COVID test 1 week ago was negative.  No history of asthma or smoking or COPD.    Review of Systems:   Review of Systems   Constitutional:  Positive for fatigue and fever.   Respiratory:  Positive for cough and shortness of breath.    Cardiovascular:  Negative for chest pain.       Patient Active Problem List   Diagnosis    Syncope    Hypotension    Carotid Sinus Hypersensitivity    URI (upper respiratory infection)    Cervicalgia    Hyperlipidemia    Chronic pain    Bee sting allergy    Numbness and tingling of leg    Chronic back pain    B12 deficiency    Chronic renal insufficiency, stage III (moderate) (HCC)    Back pain, lumbosacral    Lumbar radiculopathy, chronic    Neck pain    Fall    Tremor    Sensory neuropathy    Spondylosis of cervical region without myelopathy or radiculopathy    Idiopathic peripheral neuropathy    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 [facility])    History of alcohol dependence (HCC)    Cerebral ventriculomegaly    Cerebellar hypoplasia (HCC)    Essential hypertension    Essential tremor    Hx of spontan intraparenchymal intracran bleed assoc with hypertension    Alcohol abuse    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)    Balance problem    Chronic pain of both shoulders    Vitamin D deficiency    Difficulty in walking, not elsewhere classified    Impaired mobility and ADLs    History of traumatic brain injury    Primary osteoarthritis of right hip    Mild vascular neurocognitive disorder    Right lumbar radiculitis     Greater trochanteric pain syndrome of right lower extremity    Complex tear of medial meniscus of left knee as current injury    Primary osteoarthritis of left knee    Acute left-sided low back pain without sciatica    Chronic left-sided low back pain without sciatica    Enuresis    Closed fracture of one rib of right side    MRSA (methicillin resistant staph aureus) culture positive    Myofascial pain    Macrocytosis without anaemia    Psychological factors affecting medical condition    Chronic bilateral low back pain without sciatica    Mallet toe, right    Neurogenic bladder    BPH with obstruction/lower urinary tract symptoms    Urge incontinence    Cerebral microvascular disease    Mixed incontinence       Past Medical History:   Diagnosis Date    Cellulitis and abscess of foot 12/02/2020    Added automatically from request for surgery [id number]    Vitamin D deficiency  02/13/2016    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    Idiopathic peripheral neuropathy 03/15/2015    Spondylosis of cervical region without myelopathy or radiculopathy 05/28/2014    Back pain, lumbosacral 02/15/2013    B12 deficiency 12/21/2012    Borderline low 5 /21/2014    Chronic renal insufficiency, stage III (moderate) (HCC) 12/21/2012    5/ 2013    Hyperlipidemia 09/26/2012    Cervicalgia 03/11/2010    Radiofrequency ablation of c3 to c 7    Allergic rhinitis due to allergen     Carotid Sinus Hypersensitivity     Chronic pain     chronic LUE pain had injection.    Disc disorder of lumbosacral region     Hip injury     HYPERTENSION      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[facility])       Past Surgical History:   Procedure Laterality Date    L meniciscus Left 07/2017    by Dr Marcelle Overlie , re did left menisicus     PR ANES;  COLONOSCOPY  2002    repeat in 3 years    PR ANES; COLONOSCOPY & POLYPECTOMY  11/18/2007    repeat in 3 years    PR CORRECTION HAMMERTOE Right 05/13/2022    Dr. Hilarie Fredrickson, DPM    PR HALLUX RIGIDUS W/CHEILECTOMY 1ST MP JT W/O IMPLT Right 10/21/2017    Dr. Donn Pierini    PR UNLISTED PROCEDURE FEMUR/KNEE      PR UNLISTED PROCEDURE HANDS/FINGERS      PR UNLISTED PROCEDURE SPINE  2013    rfa to c3 to c 7     SURGICAL HX OTHER Right 12/03/2020    RIGHT foot WOUND INCISION AND DRAINAGE    TOE SURGERY Right 02/09/2019    toe surgery  Right 01/12/2019    Dr Hilarie Fredrickson      Outpatient Medications Prior to Visit   Medication Sig Dispense Refill    Acetaminophen 500 MG Oral Tab Take 2 tablets (1,000 mg) by mouth 2 times a day.      Alpha-Lipoic Acid 300 MG tablet Take 1 tablet by mouth every morning. Benfotiamine- Alpha Lipoic acid      amLODIPine 5 MG tablet Take 1 tablet (5 mg) by mouth 2 times a day. 180 tablet 3    atorvastatin 10 MG tablet Take 1 tablet (10 mg) by mouth daily. (Patient taking differently: Take 1 tablet (10 mg) by mouth every morning.) 90 tablet 3    BENFOTIAMINE OR Take 250 mg by mouth every morning.      betamethasone dipropionate 0.05 % lotion APPLY TO SCALP AND EARS TOPICALLY 2 TIMES A DAY AS NEEDED FOR ITCHING. Notify MD of any signs of infection or skin thinning 60 mL 5    Cholecalciferol (VITAMIN D3) 2000 units Oral Cap Take 1 capsule (2,000 Units) by mouth daily. For low level (Patient taking differently: Take 1 capsule (2,000 units) by mouth every morning. For low level) 1 capsule 1    clindamycin 1 % gel Apply 1 Application  topically 2 times a day as needed. Apply to back.      Cyanocobalamin 1000 MCG Oral Tab one per day over-the-counter started 5/21 /2012 this level borderline low (Patient taking  differently: Take 1 tablet (1,000 mcg) by mouth every morning.) 1 Tab 1    cyclobenzaprine 5 MG tablet Take 1 tablet (5 mg) by mouth 3 times a day as needed for muscle spasms. 20 tablet 0    diclofenac  1 % gel Apply 4 g topically 4 times a day. Apply to 2 gram four times daily to neck, trapezius muscle, and low back. Use a max of 32 grams a day. (Patient taking differently: Apply 4 g topically 4 times a day as needed. Apply to 2 gram four times daily to neck, trapezius muscle, and low back. Use a max of 32 grams a day.) 300 g 4    diclofenac sodium 50 MG EC tablet Take 1 tablet by mouth 2 times a day. Take with food. (Patient not taking: Reported on 11/04/2022) 14 tablet 0    DULoxetine 60 MG DR capsule Take 1 capsule (60 mg) by mouth 2 times a day. 180 capsule 2    EPINEPHrine 0.3 MG/0.3ML auto-injector Inject one pen (0.3mg /mL) into the muscle for severe allergic reaction. Call 911. Use second pen if symptoms continue 2 each 0    ibuprofen 200 MG tablet Take 1 tablet (200 mg) by mouth daily as needed (pain).      lidocaine 5 % patch Apply 1 patch onto the skin daily. Apply to painful area for up to 12 hours in a 24 hour period. (Patient taking differently: Apply 1 patch onto the skin daily as needed. Apply to painful area for up to 12 hours in a 24 hour period.) 30 patch 2    lisinopril 20 MG tablet Take 1 tablet (20 mg) by mouth 2 times a day. 180 tablet 3    mirabegron ER (Myrbetriq) 50 MG 24 hr tablet Take 1 tablet (50 mg) by mouth daily. 100 tablet 3    multivitamin with minerals tablet Take 1 tablet by mouth daily.      naltrexone 50 MG tablet TAKE 1 TABLET BY MOUTH DAILY WITH FOOD 30 tablet 11    Omega-3 Fatty Acids (OMEGA 3 OR) Take 1 tablet by mouth every morning.      pregabalin 300 MG capsule TAKE 1 CAPSULE BY MOUTH TWICE DAILY 180 capsule 0    primidone 50 MG tablet Take 1 tablet (50 mg) by mouth 2 times a day. 180 tablet 4     Facility-Administered Medications Prior to Visit   Medication Dose Route Frequency Provider Last Rate Last Admin    sodium chloride 0.9 % IV Bolus 500 mL  500 mL Intravenous Once Wendi Maya, MD         Review of patient's allergies indicates:  Allergies   Allergen Reactions     Adhesives Skin: Rash     Glue on lido patches    Bee Venom Skin: Hives, Skin: Itching and Swelling    Gabapentin Other     Flu like symptoms, diarrhea, body ache upset stomach    Methocarbamol Other     Patient's wife reports cognitive difficulties ("goofy")     Social History     Tobacco Use    Smoking status: Never    Smokeless tobacco: Never    Tobacco comments:     Sometimes chews, but is trying to quit. 11/28/21   Substance Use Topics    Alcohol use: Yes     Alcohol/week: 3.0 standard drinks of alcohol     Types: 3 Glasses of wine per week     Comment: goal  to get down to 2 per day    Drug use: Yes     Frequency: 3.0 times per week     Types: Marijuana     Comment: CBD (Occassionally)         Objective:   BP (!) 149/84   Pulse 79   Temp 36.5 C (Oral)   Resp 16   SpO2 94%     Physical Exam  Constitutional:       General: He is not in acute distress.     Appearance: Normal appearance. He is not ill-appearing or toxic-appearing.   HENT:      Nose: Nose normal.      Mouth/Throat:      Mouth: Mucous membranes are moist.      Pharynx: Oropharynx is clear.   Eyes:      Extraocular Movements: Extraocular movements intact.      Conjunctiva/sclera: Conjunctivae normal.      Pupils: Pupils are equal, round, and reactive to light.   Cardiovascular:      Rate and Rhythm: Normal rate and regular rhythm.      Heart sounds: Normal heart sounds.   Pulmonary:      Effort: Pulmonary effort is normal. No respiratory distress.      Breath sounds: Wheezing and rhonchi present.      Comments: Wheezes and rhonchi throughout all lung fields mostly resolved with albuterol  Neurological:      Mental Status: He is alert.             I have reviewed and independently interpreted the following imaging studies during this visit. My interpretation is as follows: clear chest, unchanged elevation of L hemidiaphragm, radiology over-read shows      XR Chest 2 View  Narrative: EXAMINATION:  XR CHEST 2 VW    CLINICAL INDICATION:   cough x 7  days, sob with exertion    COMPARISON:    Dec 02, 2019    FINDINGS AND   Impression: Lungs: Clear.    Pleura: No effusion. No pneumothorax.    Heart and mediastinum: Heart size is normal.      Bones: No acute or suspicious abnormality.      Assessment / Plan       ICD-10-CM    1. Bronchitis  J40 doxycycline monohydrate 100 MG capsule     albuterol HFA (ProAir HFA) 108 (90 Base) MCG/ACT inhaler      2. Acute cough  R05.1 XR Chest 2 View      3. Wheezing-associated respiratory infection (WARI)  J98.8 albuterol (2.5 MG/3ML) 0.083% nebulizer solution 2.5 mg     albuterol HFA (ProAir HFA) 108 (90 Base) MCG/ACT inhaler          No LOS data to display      Patient Instructions   1) Bronchitis / wheezing associated respiratory illness  - doxycycline 100mg  by mouth every 12 hours with food for 7 days  - albuterol 2 puffs via spacer (1 puf every 4 hours scheduled for next 3 days, then every 4 hours as needed for cough, wheezing shortness of breath, tightness  - return to Urgent Care if not improving somewhat in next 3 days or if not resolved in next 7 days, sooner anytime if worsening    Thurston Hole, MD

## 2022-12-22 MED ORDER — AMLODIPINE BESYLATE 5 MG OR TABS
5.0000 mg | ORAL_TABLET | Freq: Two times a day (BID) | ORAL | 1 refills | Status: DC
Start: 2022-12-22 — End: 2023-06-20

## 2022-12-25 ENCOUNTER — Encounter (INDEPENDENT_AMBULATORY_CARE_PROVIDER_SITE_OTHER): Payer: Medicare PPO | Admitting: Clinical

## 2022-12-25 ENCOUNTER — Encounter (INDEPENDENT_AMBULATORY_CARE_PROVIDER_SITE_OTHER): Payer: Self-pay

## 2022-12-29 NOTE — Progress Notes (Signed)
Ronnie Mason did not cancel and was not present for a scheduled appointment today.  Disposition: pt has missed two sessions of ACT for chronic pain and will be dropped from the group; sent pt message inviting him to the next offering of the group, in June

## 2023-01-01 ENCOUNTER — Ambulatory Visit (INDEPENDENT_AMBULATORY_CARE_PROVIDER_SITE_OTHER): Payer: Medicare PPO | Admitting: Clinical

## 2023-01-07 ENCOUNTER — Telehealth (INDEPENDENT_AMBULATORY_CARE_PROVIDER_SITE_OTHER): Payer: Self-pay | Admitting: Acupuncturist

## 2023-01-07 NOTE — Telephone Encounter (Signed)
Sent Dr. Beiley's cancellation information to pt via Mychart message.

## 2023-01-08 ENCOUNTER — Ambulatory Visit (INDEPENDENT_AMBULATORY_CARE_PROVIDER_SITE_OTHER): Payer: Medicare PPO | Admitting: Clinical

## 2023-01-08 ENCOUNTER — Ambulatory Visit (HOSPITAL_BASED_OUTPATIENT_CLINIC_OR_DEPARTMENT_OTHER): Payer: Medicare PPO | Admitting: Anesthesiology

## 2023-01-15 ENCOUNTER — Ambulatory Visit (INDEPENDENT_AMBULATORY_CARE_PROVIDER_SITE_OTHER): Payer: Medicare PPO | Admitting: Clinical

## 2023-01-19 NOTE — Progress Notes (Unsigned)
[facility] Follow Up Visit  01/21/2023    Karlton Batson;    MRN: [id number];   DOB: [birth year]    No chief complaint on file.    History of Present Illness:  Mr. Ferson was  last seen by Dr. Araceli Bouche on 12/04/2022 for C7-T1 cervical epidural steroid injection.    He is here today for a follow up visit.      The patient has had ***% pain relief in the neck since the recent procedure (C7-T1 cervical epidural steroid injection).  The relief has been sustained / lasted for ***.    Issues that Mr. Kint would like to address today include ***.    Interval/new treatment and medical events since his last visit includes: {NOTHING:103925}.  New treatment {HAS/HAS NOT TRAVEL (HH):104826} been helpful.    Current medications related to pain and related conditions include:  ***  Other current/recent treatments:  ***    The pain is located primarily in the {LOCATION ON BODY:52}.  Other pain locations include {LOCATION ON BODY:52}.  He describes his *** pain as {PAINDESCRIPTIONS (ORTHO/SSP):105744}. His other pains are described as {PAINDESCRIPTIONS (ORTHO/SSP):105744}.  He feels the overall pain is  {course:17}.  The pain is made better by ***.  The pain is made worse by ***.  The impacts of the pain include ***.    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, N13.8] 07/17/2022    Urge incontinence [N39.41] 07/17/2022    Mallet toe, right [M20.5X1] 04/23/2022      Added automatically from request for surgery [id number]      Chronic bilateral low back pain without sciatica [M54.50, G89.29] 04/29/2021    Psychological factors affecting medical condition [F54] 04/24/2021    Macrocytosis without anaemia [D75.89] 02/12/2020    Myofascial pain [M79.18] 10/20/2019    MRSA (methicillin resistant staph aureus) culture positive [Z22.322] 07/13/2019    Closed fracture of one rib of right side [S22.31XA] 01/26/2019     See x ray 01/26/2019   Right anterior lat sub acute chronic       Enuresis [R32] 09/06/2018    Chronic left-sided low back pain without sciatica [M54.50, G89.29] 06/21/2018    Acute left-sided low back pain without sciatica [M54.50] 03/14/2018    Greater trochanteric pain syndrome of right lower extremity [M25.551] 11/25/2017    Complex tear of medial meniscus of left knee as current injury [S83.232A] 09/08/2017    Primary osteoarthritis of left knee [M17.12] 09/08/2017    Right lumbar radiculitis [M54.16] 05/05/2017    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, 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] 04/13/2017    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] 01/11/2017     Osteoarthritis of right hip x ray report - moderate to advanced radiology read 01/11/2017      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] 08/06/2016    Impaired mobility and ADLs [Z74.09, Z78.9] 04/08/2016  Difficulty in walking, not elsewhere classified [R26.2] 03/12/2016    Vitamin D deficiency [E55.9] 02/13/2016    Balance problem [R26.89] 01/23/2016    Chronic pain of both shoulders [M25.511, G89.29, M25.512] 01/23/2016     X ray arthritis  02/19/2016        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, 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, 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] 01/06/2016    Essential hypertension [I10] 12/16/2015    Essential tremor [G25.0] 12/16/2015    Hx of spontan intraparenchymal intracran bleed assoc with hypertension [Z86.79] 12/16/2015     [place] 4/29- 12/10/2015  Non-traumatic intraparenchymal hemorrhage, hypertensive  of the left thalamus   Traumatic hemorrhage   -- falcine subdural hemorrhage, small cortical subarachnoid      Alcohol abuse [F10.10] 12/16/2015    Cerebral ventriculomegaly [G93.89] 10/09/2015    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    History of alcohol dependence (HCC) [F10.21] 06/26/2015    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) [G37.9] 05/07/2015    Idiopathic peripheral neuropathy [G60.9] 03/15/2015    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] 05/28/2014    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] 01/18/2014    Tremor [R25.1] 04/20/2013    Fall [W19.XXXA] 02/23/2013     BP < 100  Occasioanal tri[p and leg weakness      Back pain, lumbosacral [M54.50] 02/15/2013    Lumbar radiculopathy, chronic [M54.16] 02/15/2013    Neck pain [M54.2] 02/15/2013    Chronic pain [G89.29] 12/21/2012     Now at [facility], DO, Santiago Glad    Neck pain -burn c3-7 on left side per patient  Some trigger injection    Lumbar pain-since 80's better with exercise      Bee sting allergy [Z91.030] 12/21/2012     hives      Numbness and tingling of leg [R20.0, R20.2] 12/21/2012     Left calf -left foot long term suspect from back-suspect L 45      Chronic back pain [M54.9, G89.29] 12/21/2012     Mri in mid scape 1/214  Multilevel degenerative disc disease of the lumbar spine. Circumferential disc bulge at all levels below and including L1-2. No significant central spinal canal stenosis or neuroforaminal narrowing.  2. Mild retrolisthesis of L5 on S1.            B12 deficiency [E53.8] 12/21/2012     Borderline low 5 /21/2014      Chronic renal insufficiency, stage III (moderate) (HCC) [N18.30] 12/21/2012     5/ 2013      Hyperlipidemia [E78.5] 09/26/2012    Cervicalgia [M54.2] 03/11/2010     Radiofrequency ablation of c3 to c 7          Pain Tracker Scores  {ORT Score -  ***/26  AUDIT-C Score-  ***/12  PHQ-9 Score - ***/27  SI Score - ***/3  PTSD Score - ***/4  STOP Score - ***/4    Pain intensity &  Interference (lower is better)  Pain intensity  ***/10   Pain Interference ***/10    GAD-7:  ***/21    Sleep (lower is better)  Sleep Initiation ***/10  Sleep Mainteneace ***/10    ODI & Important Activity Difficulty (lower is better)  ODI   ***/100  Activity Difficulty ***/10    QOL & Satisfaction   Quality of Life      ***/10  Treatment Satisfaction   ***/10    Days With Excess Meds (Last Month):  Important Activity:    Most Bothersome Side Effect:}  Past Medical History:   Diagnosis Date    Cellulitis and abscess of foot 12/02/2020    Added automatically from request for surgery [id number]    Vitamin D deficiency 02/13/2016    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    Idiopathic peripheral neuropathy 03/15/2015    Spondylosis of cervical region without myelopathy or radiculopathy 05/28/2014    Back pain, lumbosacral 02/15/2013    B12 deficiency 12/21/2012    Borderline low 5 /21/2014    Chronic renal insufficiency, stage III (moderate) (HCC) 12/21/2012    5/ 2013    Hyperlipidemia 09/26/2012    Cervicalgia 03/11/2010    Radiofrequency ablation of c3 to c 7    Allergic rhinitis due to allergen     Carotid Sinus Hypersensitivity     Chronic pain     chronic LUE pain had injection.    Disc disorder of lumbosacral region     Hip injury     HYPERTENSION      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[facility])      Past Surgical History:   Procedure Laterality Date    L meniciscus Left 07/2017    by Dr Marcelle Overlie , re did left menisicus     PR ANES; COLONOSCOPY  2002    repeat in 3 years    PR ANES; COLONOSCOPY & POLYPECTOMY  11/18/2007    repeat in 3 years    PR CORRECTION HAMMERTOE Right 05/13/2022    Dr. Hilarie Fredrickson, DPM    PR HALLUX RIGIDUS W/CHEILECTOMY 1ST MP JT W/O IMPLT Right 10/21/2017    Dr. Donn Pierini    PR UNLISTED PROCEDURE FEMUR/KNEE      PR UNLISTED PROCEDURE HANDS/FINGERS      PR UNLISTED PROCEDURE SPINE  2013    rfa to c3 to c 7     SURGICAL HX OTHER Right 12/03/2020    RIGHT foot WOUND INCISION AND DRAINAGE    TOE SURGERY Right 02/09/2019    toe surgery  Right 01/12/2019    Dr Hilarie Fredrickson      Family History       Problem (# of Occurrences) Relation (Name,Age of Onset)    Heart (other) (1) Mother: MI    Other Family Hx (1) Other: myelodysplasia    Colon Cancer (1) Father    Suicide Attempt (1) Brother: died of suicide           Negative family history of: Cancer          Social History     Tobacco Use    Smoking status: Never    Smokeless tobacco: Never    Tobacco comments:     Sometimes chews, but is trying to quit. 11/28/21   Substance Use Topics    Alcohol use: Yes     Alcohol/week: 3.0 standard drinks of alcohol     Types: 3 Glasses of wine per week     Comment: goal to get down to 2 per day     Current Outpatient Medications   Medication Sig Dispense Refill    Acetaminophen 500 MG Oral Tab Take 2 tablets (1,000 mg) by mouth 2 times a day.      albuterol HFA (ProAir HFA) 108 (90 Base) MCG/ACT inhaler Inhale 2 puffs  by mouth every 4 hours as needed for shortness of breath/wheezing for up to 10 days. Dispense with spacer 18 g 0    Alpha-Lipoic Acid 300 MG tablet Take 1 tablet by mouth every morning. Benfotiamine- Alpha Lipoic acid      amLODIPine 5 MG tablet Take 1 tablet (5 mg) by mouth 2 times a day. 180 tablet 1    atorvastatin 10 MG tablet Take 1 tablet (10 mg) by mouth daily. (Patient taking differently: Take 1 tablet (10 mg) by mouth every morning.) 90 tablet 3    BENFOTIAMINE OR Take 250 mg by mouth every morning.      betamethasone dipropionate 0.05 % lotion APPLY TO SCALP AND EARS TOPICALLY 2 TIMES A DAY AS NEEDED FOR ITCHING. Notify MD of any signs of infection or skin thinning 60 mL 5    Cholecalciferol (VITAMIN D3) 2000 units Oral Cap Take 1 capsule (2,000 Units) by mouth daily. For low  level (Patient taking differently: Take 1 capsule (2,000 units) by mouth every morning. For low level) 1 capsule 1    clindamycin 1 % gel Apply 1 Application  topically 2 times a day as needed. Apply to back.      Cyanocobalamin 1000 MCG Oral Tab one per day over-the-counter started 5/21 /2012 this level borderline low (Patient taking differently: Take 1 tablet (1,000 mcg) by mouth every morning.) 1 Tab 1    cyclobenzaprine 5 MG tablet Take 1 tablet (5 mg) by mouth 3 times a day as needed for muscle spasms. 20 tablet 0    diclofenac 1 % gel Apply 4 g topically 4 times a day. Apply to 2 gram four times daily to neck, trapezius muscle, and low back. Use a max of 32 grams a day. (Patient taking differently: Apply 4 g topically 4 times a day as needed. Apply to 2 gram four times daily to neck, trapezius muscle, and low back. Use a max of 32 grams a day.) 300 g 4    diclofenac sodium 50 MG EC tablet Take 1 tablet by mouth 2 times a day. Take with food. (Patient not taking: Reported on 11/04/2022) 14 tablet 0    DULoxetine 60 MG DR capsule Take 1 capsule (60 mg) by mouth 2 times a day. 180 capsule 2    EPINEPHrine 0.3 MG/0.3ML auto-injector Inject one pen (0.3mg /mL) into the muscle for severe allergic reaction. Call 911. Use second pen if symptoms continue 2 each 0    ibuprofen 200 MG tablet Take 1 tablet (200 mg) by mouth daily as needed (pain).      lidocaine 5 % patch Apply 1 patch onto the skin daily. Apply to painful area for up to 12 hours in a 24 hour period. (Patient taking differently: Apply 1 patch onto the skin daily as needed. Apply to painful area for up to 12 hours in a 24 hour period.) 30 patch 2    lisinopril 20 MG tablet Take 1 tablet (20 mg) by mouth 2 times a day. 180 tablet 3    mirabegron ER (Myrbetriq) 50 MG 24 hr tablet Take 1 tablet (50 mg) by mouth daily. 100 tablet 3    multivitamin with minerals tablet Take 1 tablet by mouth daily.      naltrexone 50 MG tablet TAKE 1 TABLET BY MOUTH DAILY WITH  FOOD 30 tablet 11    Omega-3 Fatty Acids (OMEGA 3 OR) Take 1 tablet by mouth every morning.      pregabalin 300 MG  capsule TAKE 1 CAPSULE BY MOUTH TWICE DAILY 180 capsule 0    primidone 50 MG tablet Take 1 tablet (50 mg) by mouth 2 times a day. 180 tablet 4     Current Facility-Administered Medications   Medication Dose Route Frequency Provider Last Rate Last Admin    sodium chloride 0.9 % IV Bolus 500 mL  500 mL Intravenous Once Wendi Maya, MD          Review of patient's allergies indicates:  Allergies   Allergen Reactions    Adhesives Skin: Rash     Glue on lido patches    Bee Venom Skin: Hives, Skin: Itching and Swelling    Gabapentin Other     Flu like symptoms, diarrhea, body ache upset stomach    Methocarbamol Other     Patient's wife reports cognitive difficulties ("goofy")       The past medical, surgical, family, and social histories {.:100037} reviewed and updated personally with Mr. Hafford.  Medications and allergies {.:100037} also reviewed and confirmed personally.  He reports no other change in past medical history, past surgical history, family history, or social history since last visit except as noted above.      Review of Systems:  The Review of Systems {was/was not:110543::"was"} provided by Mr. Johnston and entered in the [place] note associated with this encounter.  I {DID/DID NOT:27244::"did"} personally reviewed and confirmed the ROS information with Mr. Meaker and have the following additional comments:   {.:108187}.    Physical Examination: via video  General: {GENERAL APPEARANCE:50::"healthy","alert","no distress"}  Psychiatric  Judgement/insight: {PSYCHIATRIC - JUDGEMENT/INSIGHT:103731::"Normal"};  Mood/affect: {PSYCHIATRIC - MOOD/AFFECT:103732::"Normal."};  Orientation: {PSYCHIATRIC - ORIENTATION:103733::"Normal."}.Oriented , interactive.       Assessment/Discussion  No diagnosis found.    Qadry Belmonte is a 72 year old male with   1. Progressive left neck pain with radiation to left lateral  upper arm.  Exam and EMG consistent with C5 and C6 radiculopathy. Evaluated by Dr. Welton Flakes on 09/01/22 and determined to be likely candidate for C4-6 ACDF. Worsening   2. Axial low back pain 2/2 facet arthropathy with improvement after L4-5, L5-S1 RFA. Additional components of myofascial pain syndrome and central sensitization, stable  3. Right hip pain 2/2 severe OA  4. Prior treatment failures including Short duration of benefit for interlaminar lumbar epidural steroids and TFESI, MBB, memantine, Naltrexone.   5. Acute left IT band syndrome. Will start oral diclofenac ***  6. Significant medical comorbidities significant for multiple pain syndromes (neck pain, shoulder pain previously, groin/hip pain, prior greater trochanteric pain syndrome, R foot fracture, R lateral malleolus fracture), social hx positive for alcohol use disorder recently relapsed and in treatment, hx of possible NPH, hx of spontaneous intraparenchymal intracran bleed (including thalamus), mild TBI, among others    Plan:      Diagnostic evaluation: ***    Rehabilitation: ***    Medication suggestions: ***    Procedure suggestions: ***    Follow-up: ***      Jonni Sanger, PA-C  Physician Assistant - Teaching Associate  [facility] [place] [facility]  Anesthesiology & Pain Medicine

## 2023-01-21 ENCOUNTER — Ambulatory Visit: Payer: Medicare PPO | Attending: Medical | Admitting: Medical

## 2023-01-21 VITALS — BP 123/79 | HR 73 | Temp 98.4°F | Ht 76.0 in | Wt 225.0 lb

## 2023-01-21 DIAGNOSIS — M7918 Myalgia, other site: Secondary | ICD-10-CM | POA: Insufficient documentation

## 2023-01-21 DIAGNOSIS — M4802 Spinal stenosis, cervical region: Secondary | ICD-10-CM | POA: Insufficient documentation

## 2023-01-21 DIAGNOSIS — M542 Cervicalgia: Secondary | ICD-10-CM | POA: Insufficient documentation

## 2023-01-21 DIAGNOSIS — M5416 Radiculopathy, lumbar region: Secondary | ICD-10-CM | POA: Insufficient documentation

## 2023-01-21 DIAGNOSIS — M5417 Radiculopathy, lumbosacral region: Secondary | ICD-10-CM | POA: Insufficient documentation

## 2023-01-21 DIAGNOSIS — M47812 Spondylosis without myelopathy or radiculopathy, cervical region: Secondary | ICD-10-CM | POA: Insufficient documentation

## 2023-01-21 DIAGNOSIS — M5412 Radiculopathy, cervical region: Secondary | ICD-10-CM | POA: Insufficient documentation

## 2023-01-21 MED ORDER — TIZANIDINE HCL 4 MG OR TABS
4.0000 mg | ORAL_TABLET | Freq: Every evening | ORAL | 0 refills | Status: DC
Start: 2023-01-21 — End: 2023-03-08

## 2023-01-21 NOTE — Progress Notes (Signed)
Review of Systems   Constitutional: Negative.    HENT: Negative.    Eyes: Negative.    Respiratory: Negative.    Cardiovascular: Negative.    Gastrointestinal: Negative.    Endocrine: Negative.    Genitourinary: Negative.    Musculoskeletal: Positive for arthralgias, back pain, gait problem, myalgias, neck pain and neck stiffness.   Skin: Negative.    Allergic/Immunologic: Negative.    Hematological: Negative.    Psychiatric/Behavioral: Negative.

## 2023-01-21 NOTE — Patient Instructions (Signed)
It was my pleasure to help take part in your care today.  Thank you for coming to visit [place] at the [facility]. Please contact our clinic if you have any questions or concerns.    Here is a copy of a draft of our suggestions and other pain information.     Plan:      Diagnostic evaluation:   - None at this time.    Rehabilitation:   - Start daily stretches every morning.    Medication suggestions:   - Cyclobenzaprine worked well but was sedating. Switch to tizanidine 4 mg QHS. May refill if works for pain relief.  - Diclofenac did not help, so OK to discontinue.    Procedure suggestions:   - Return for caudal epidural steroid injection.  - Cervical pain currently controlled. Can consider repeat CESI or CT-guided C4-5 transforaminal epidural steroid injection in the future.  - Can consider spinal cord stimulation for lower back and leg symptoms after cervical surgery (assuming CESI or TFESI not sufficient to delay surgery further).    Follow-up:   - For Procedure    Please update PainTracker at [email or web address] prior to your next visit to the [facility].   For log in assistance or to reset your PainTracker, please call clinic at [phone], #[phone].

## 2023-01-22 ENCOUNTER — Ambulatory Visit (INDEPENDENT_AMBULATORY_CARE_PROVIDER_SITE_OTHER): Payer: Medicare PPO | Admitting: Acupuncturist

## 2023-01-22 ENCOUNTER — Ambulatory Visit (INDEPENDENT_AMBULATORY_CARE_PROVIDER_SITE_OTHER): Payer: Medicare PPO | Admitting: Clinical

## 2023-01-22 DIAGNOSIS — G8929 Other chronic pain: Secondary | ICD-10-CM

## 2023-01-22 DIAGNOSIS — M5459 Other low back pain: Secondary | ICD-10-CM

## 2023-01-22 NOTE — Progress Notes (Signed)
[place]    Acupuncture Visit (#8 in 2024)      Referred by: Drexel Iha, MD     PCP: [name], MD    HPI: Linsey Hirota was referred to acupuncture and [place] medicine for consultation and treatment of     Chief Complaint   Patient presents with    Acupuncture     Chronic Lower back pain     CC1: Chronic Lower back pain  - Location: Bilateral lower back,  - Pain scale today: for LBP 4-5/10, last week went up 8-9/10.   - After did some work on his properties for 2 days. 3rd day couldn't stand due to the pain  - bilateral lower back and left hip are painful today.   - His doctor indicated that his MRI indicates that lower back has deteriorited a lot.   - Planning to have one last epidoral injection sometime near future, and might need to have neurosurgent consultaiton.     - Using tends unit at home everyday.   - takes 3000mg  of Tylenol daily for the pain aside from other prescriptions.     Neck has been doing well.       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    Idiopathic peripheral neuropathy 03/15/2015    Spondylosis of cervical region without myelopathy or radiculopathy 05/28/2014    Back pain, lumbosacral 02/15/2013    B12 deficiency 12/21/2012    Borderline low 5 /21/2014    Chronic renal insufficiency, stage III (moderate) (HCC) 12/21/2012    5/ 2013    Hyperlipidemia 09/26/2012    Cervicalgia 03/11/2010    Radiofrequency ablation of c3 to c 7    Allergic rhinitis due to allergen     Carotid Sinus  Hypersensitivity     Chronic pain     chronic LUE pain had injection.    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 Hilarie Fredrickson, DPM    PR HALLUX RIGIDUS W/CHEILECTOMY 1ST MP JT W/O IMPLT Right 10/21/2017    Dr. Donn Pierini    PR UNLISTED PROCEDURE FEMUR/KNEE      PR UNLISTED PROCEDURE HANDS/FINGERS      PR UNLISTED PROCEDURE SPINE  2013    rfa to c3 to c 7     SURGICAL HX OTHER Right 12/03/2020    RIGHT foot WOUND INCISION AND DRAINAGE    TOE  SURGERY Right 02/09/2019    toe surgery  Right 01/12/2019    Dr Hilarie Fredrickson         Medications:  Outpatient Medications Prior to Visit   Medication Sig Dispense Refill    Acetaminophen 500 MG Oral Tab Take 2 tablets (1,000 mg) by mouth 2 times a day.      albuterol HFA (ProAir HFA) 108 (90 Base) MCG/ACT inhaler Inhale 2 puffs by mouth every 4 hours as needed for shortness of breath/wheezing for up to 10 days. Dispense with spacer 18 g 0    Alpha-Lipoic Acid 300 MG tablet Take 1 tablet by mouth every morning. Benfotiamine- Alpha Lipoic acid      amLODIPine 5 MG tablet Take 1 tablet (5 mg) by mouth 2 times a day. 180 tablet 1    atorvastatin 10 MG tablet Take 1 tablet (10 mg) by mouth daily. (Patient taking differently: Take 1 tablet (10 mg) by mouth every morning.) 90 tablet 3    BENFOTIAMINE OR Take 250 mg by mouth every morning.      betamethasone dipropionate 0.05 % lotion APPLY TO SCALP AND EARS TOPICALLY 2 TIMES A DAY AS NEEDED FOR ITCHING. Notify MD of any signs of infection or skin thinning 60 mL 5    Cholecalciferol (VITAMIN D3) 2000 units Oral Cap Take 1 capsule (2,000 Units) by mouth daily. For  low level (Patient taking differently: Take 1 capsule (2,000 units) by mouth every morning. For low level) 1 capsule 1    clindamycin 1 % gel Apply 1 Application  topically 2 times a day as needed. Apply to back.      Cyanocobalamin 1000 MCG Oral Tab one per day over-the-counter started 5/21 /2012 this level borderline low (Patient taking differently: Take 1 tablet (1,000 mcg) by mouth every morning.) 1 Tab 1    diclofenac 1 % gel Apply 4 g topically 4 times a day. Apply to 2 gram four times daily to neck, trapezius muscle, and low back. Use a max of 32 grams a day. (Patient taking differently: Apply 4 g topically 4 times a day as needed. Apply to 2 gram four times daily to neck, trapezius muscle, and low back. Use a max of 32 grams a day.) 300 g 4    DULoxetine 60 MG DR capsule Take 1 capsule (60 mg) by mouth 2 times a day. 180 capsule 2    EPINEPHrine 0.3 MG/0.3ML auto-injector Inject one pen (0.3mg /mL) into the muscle for severe allergic reaction. Call 911. Use second pen if symptoms continue 2 each 0    ibuprofen 200 MG tablet Take 1 tablet (200 mg) by mouth daily as needed (pain).      lidocaine 5 % patch Apply 1 patch onto the skin daily. Apply to painful area for up to 12 hours in a 24 hour period. (Patient taking differently: Apply 1 patch onto the skin daily as needed. Apply to painful area for up to 12 hours in a 24 hour period.) 30 patch 2    lisinopril 20 MG tablet Take 1 tablet (20 mg) by mouth 2 times a day. 180 tablet 3    MAGNESIUM CITRATE OR Take by mouth at bedtime.      mirabegron ER (Myrbetriq) 50 MG 24 hr tablet Take 1 tablet (50 mg) by mouth daily. 100 tablet 3    multivitamin with minerals tablet Take 1 tablet by mouth daily.      naltrexone 50 MG tablet TAKE 1 TABLET BY MOUTH  DAILY WITH FOOD 30 tablet 11    Omega-3 Fatty Acids (OMEGA 3 OR) Take 1 tablet by mouth every morning.      pregabalin 300 MG capsule TAKE 1 CAPSULE BY MOUTH TWICE DAILY 180 capsule 0    primidone 50 MG tablet Take 1  tablet (50 mg) by mouth 2 times a day. 180 tablet 4    tiZANidine 4 MG tablet Take 1 tablet (4 mg) by mouth at bedtime. 30 tablet 0     Facility-Administered Medications Prior to Visit   Medication Dose Route Frequency Provider Last Rate Last Admin    sodium chloride 0.9 % IV Bolus 500 mL  500 mL Intravenous Once Wendi Maya, MD            Allergies:  Review of patient's allergies indicates:  Allergies   Allergen Reactions    Adhesives Skin: Rash     Glue on lido patches    Bee Venom Skin: Hives, Skin: Itching and Swelling    Gabapentin Other     Flu like symptoms, diarrhea, body ache upset stomach    Methocarbamol Other     Patient's wife reports cognitive difficulties ("goofy")        Family/Social history:  family history includes Colon Cancer in his father; Heart (other) in his mother; Other Family Hx in an other family member; Suicide Attempt in his brother. There is no history of Cancer.       OBJECTIVE:  Vitals:  There were no vitals taken for this visit.  Pulse: Thin and deep  Tongue: Red tongue body with back coating in the middle jiao region    Palpation: Discoloration of skin at the lower mid back - Shaun noted he is using the tens unit around the area.     Assessment/Plan:    Following an evaluation including the history of present illness/S, medical history and examination, the patient's clinical presentation suggest probable:    (M54.50,  G89.29) Chronic bilateral low back pain, unspecified whether sciatica present  (primary encounter diagnosis)    .     Diagnosis According to [place] [facility]:  Bone Bi, Qid and blood stagnation in KUB, GB, and S channels.     Treatment Principle/Goals:   Short-term: Decrease discomfort/pain, improve circulation and increase range of motion  Long-term: Minimize probability of future exacerbations.    Acupuncture Treatment:  Verbal consent was obtained from the patient before performing acupuncture and other modalities in this session.     Second set of needles:   Prone  Face to face: 15 Mins  Mid: UB24, GB30, UB40, UB60, Du3  L: Tun Zhong    Electro between  [employer] to [employer] with 2 hz continuous   [place] Jetty applied at the lower back    Needles in: 11  Needles out: 11    First set of needles: Supine  Face to face: 8 Mins  Yintang, GB39  R: Lumber, Zong Bai, Xia Bai,   L: Sympathetic, shenmen, Ling Gu    Needles in: 6  Needles out: 6    Pyonex press needle (0.55mm) were placed at Du 14 and D3, bilateral GB31, and GB29, advised to keep them upto 5 days and take them off. However, if there is any irritability or discomfort felt, then remove them sooner. I also advised to discard the used one safely.     Post Treatment Note:   Nimesh tolerated the all the treatment well without any complications.  Time Spent:   - Time spent for face-to-face for acupuncture: 23 minutes.      Augusto Gamble, AEMP, DACM, LAc  Licensed Acupuncturist, Paramedic for [employer]  Department of Family medicine  [place] [facility] [place]

## 2023-01-29 ENCOUNTER — Ambulatory Visit (INDEPENDENT_AMBULATORY_CARE_PROVIDER_SITE_OTHER): Payer: Medicare PPO | Admitting: Acupuncturist

## 2023-01-29 DIAGNOSIS — M5459 Other low back pain: Secondary | ICD-10-CM

## 2023-01-29 DIAGNOSIS — G8929 Other chronic pain: Secondary | ICD-10-CM

## 2023-01-29 DIAGNOSIS — M545 Low back pain, unspecified: Secondary | ICD-10-CM

## 2023-01-29 NOTE — Progress Notes (Addendum)
[facility]    Acupuncture Visit (#9 in 2024)      Referred by: Drexel Iha, MD     PCP: [name], MD    HPI: Mendel Romeo was referred to acupuncture and [place] medicine for consultation and treatment of     Chief Complaint   Patient presents with    Acupuncture     Chronic Lower back pain     CC1: Chronic Lower back pain  - Location: Bilateral lower back,  - Lower back 6/10 noting this week is not great even though Dilpreet took a whole week easy. Avoided strenuous activities  - Pain is more focus on the paraspinal from below scapula level to all the way to insertion of erelctorspnea groups.   - Noting there are bilateral knots at above iliac crest/hip region. L>R  - Using tends unit at home everyday.   - Still takes 3000mg  of Tylenol daily for the pain aside from other prescriptions.     Other MSK noted  Neck has been doing ok, but left side had tightness that radiates to the shoulder.     From recent medical doctor visit: His doctor indicated that his MRI indicates that lower back has deteriorited a lot.     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    Idiopathic peripheral neuropathy 03/15/2015    Spondylosis of cervical region without myelopathy or radiculopathy 05/28/2014    Back pain, lumbosacral 02/15/2013    B12 deficiency 12/21/2012    Borderline low 5 /21/2014    Chronic renal insufficiency, stage III (moderate) (HCC) 12/21/2012    5/ 2013    Hyperlipidemia 09/26/2012    Cervicalgia 03/11/2010     Radiofrequency ablation of c3 to c 7    Allergic rhinitis due to allergen     Carotid Sinus Hypersensitivity     Chronic pain     chronic LUE pain had injection.    Disc disorder of lumbosacral region     Hip injury     HYPERTENSION      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[facility])         Surgical History:  Past Surgical History:   Procedure Laterality Date    L meniciscus Left 07/2017    by Dr Marcelle Overlie , re did left menisicus     PR ANES; COLONOSCOPY  2002    repeat in 3 years    PR ANES; COLONOSCOPY & POLYPECTOMY  11/18/2007    repeat in 3 years    PR CORRECTION HAMMERTOE Right 05/13/2022    Dr. Hilarie Fredrickson, DPM    PR HALLUX RIGIDUS W/CHEILECTOMY 1ST MP JT W/O IMPLT Right 10/21/2017    Dr. Donn Pierini    PR UNLISTED PROCEDURE FEMUR/KNEE      PR UNLISTED PROCEDURE HANDS/FINGERS      PR UNLISTED PROCEDURE SPINE  2013    rfa to c3 to c 7     SURGICAL HX OTHER Right 12/03/2020    RIGHT  foot WOUND INCISION AND DRAINAGE    TOE SURGERY Right 02/09/2019    toe surgery  Right 01/12/2019    Dr Hilarie Fredrickson         Medications:  Outpatient Medications Prior to Visit   Medication Sig Dispense Refill    Acetaminophen 500 MG Oral Tab Take 2 tablets (1,000 mg) by mouth 2 times a day.      albuterol HFA (ProAir HFA) 108 (90 Base) MCG/ACT inhaler Inhale 2 puffs by mouth every 4 hours as needed for shortness of breath/wheezing for up to 10 days. Dispense with spacer 18 g 0    Alpha-Lipoic Acid 300 MG tablet Take 1 tablet by mouth every morning. Benfotiamine- Alpha Lipoic acid      amLODIPine 5 MG tablet Take 1 tablet (5 mg) by mouth 2 times a day. 180 tablet 1    atorvastatin 10 MG tablet Take 1 tablet (10 mg) by mouth daily. (Patient taking differently: Take 1 tablet (10 mg) by mouth every morning.) 90 tablet 3    BENFOTIAMINE OR Take 250 mg by mouth every morning.      betamethasone dipropionate 0.05 % lotion APPLY TO SCALP AND EARS TOPICALLY 2 TIMES A DAY AS NEEDED FOR ITCHING. Notify MD of any signs of infection or skin thinning 60 mL 5     Cholecalciferol (VITAMIN D3) 2000 units Oral Cap Take 1 capsule (2,000 Units) by mouth daily. For low level (Patient taking differently: Take 1 capsule (2,000 units) by mouth every morning. For low level) 1 capsule 1    clindamycin 1 % gel Apply 1 Application  topically 2 times a day as needed. Apply to back.      Cyanocobalamin 1000 MCG Oral Tab one per day over-the-counter started 5/21 /2012 this level borderline low (Patient taking differently: Take 1 tablet (1,000 mcg) by mouth every morning.) 1 Tab 1    diclofenac 1 % gel Apply 4 g topically 4 times a day. Apply to 2 gram four times daily to neck, trapezius muscle, and low back. Use a max of 32 grams a day. (Patient taking differently: Apply 4 g topically 4 times a day as needed. Apply to 2 gram four times daily to neck, trapezius muscle, and low back. Use a max of 32 grams a day.) 300 g 4    DULoxetine 60 MG DR capsule Take 1 capsule (60 mg) by mouth 2 times a day. 180 capsule 2    EPINEPHrine 0.3 MG/0.3ML auto-injector Inject one pen (0.3mg /mL) into the muscle for severe allergic reaction. Call 911. Use second pen if symptoms continue 2 each 0    ibuprofen 200 MG tablet Take 1 tablet (200 mg) by mouth daily as needed (pain).      lidocaine 5 % patch Apply 1 patch onto the skin daily. Apply to painful area for up to 12 hours in a 24 hour period. (Patient taking differently: Apply 1 patch onto the skin daily as needed. Apply to painful area for up to 12 hours in a 24 hour period.) 30 patch 2    lisinopril 20 MG tablet Take 1 tablet (20 mg) by mouth 2 times a day. 180 tablet 3    MAGNESIUM CITRATE OR Take by mouth at bedtime.      mirabegron ER (Myrbetriq) 50 MG 24 hr tablet Take 1 tablet (50 mg) by mouth daily. 100 tablet 3    multivitamin with minerals tablet Take 1 tablet by mouth daily.  naltrexone 50 MG tablet TAKE 1 TABLET BY MOUTH DAILY WITH FOOD 30 tablet 11    Omega-3 Fatty Acids (OMEGA 3 OR) Take 1 tablet by mouth every morning.      pregabalin 300  MG capsule TAKE 1 CAPSULE BY MOUTH TWICE DAILY 180 capsule 0    primidone 50 MG tablet Take 1 tablet (50 mg) by mouth 2 times a day. 180 tablet 4    tiZANidine 4 MG tablet Take 1 tablet (4 mg) by mouth at bedtime. 30 tablet 0     Facility-Administered Medications Prior to Visit   Medication Dose Route Frequency Provider Last Rate Last Admin    sodium chloride 0.9 % IV Bolus 500 mL  500 mL Intravenous Once Wendi Maya, MD            Allergies:  Review of patient's allergies indicates:  Allergies   Allergen Reactions    Adhesives Skin: Rash     Glue on lido patches    Bee Venom Skin: Hives, Skin: Itching and Swelling    Gabapentin Other     Flu like symptoms, diarrhea, body ache upset stomach    Methocarbamol Other     Patient's wife reports cognitive difficulties ("goofy")        Family/Social history:  family history includes Colon Cancer in his father; Heart (other) in his mother; Other Family Hx in an other family member; Suicide Attempt in his brother. There is no history of Cancer.       OBJECTIVE:  Vitals:  There were no vitals taken for this visit.  Pulse: Thin and deep  Tongue: Red tongue body with back coating in the middle jiao region    Palpation: Discoloration of skin at the lower mid back - Cagney noted he is using the tens unit around the area.     Assessment/Plan:    Following an evaluation including the history of present illness/S, medical history and examination, the patient's clinical presentation suggest probable:    (M54.50,  G89.29) Chronic bilateral low back pain, unspecified whether sciatica present  (primary encounter diagnosis)    .     Diagnosis According to [place] Medicine:  Bone Bi, Qid and blood stagnation in UB, GB, and SI channels.     Treatment Principle/Goals:   Short-term: Decrease discomfort/pain, improve circulation and increase range of motion  Long-term: Minimize probability of future exacerbations.    Acupuncture Treatment:  Verbal consent was obtained from the patient before  performing acupuncture and other modalities in this session.     Second set of needles:  Supine  Mid: YNSA D1, YNSA E, Debria Garret, KD3,  R - [place] in: 11  Needles out: 11    First set of needles: Seating position  BL: GB12, Du14, SI11, UB23, Yaoyan    Needles in:9  Needles out: 9    Bodily Work: Gentle Guasha on the shoulder, mid back and lower back with massage oil applied. I avoided any skin regions (several pimple on his back).  Pyonex press needle (0.10mm) were placed at bilateral KD8, yaoyan, UB23, Right SI11, GB21, advised to keep them upto 5 days and take them off. However, if there is any irritability or discomfort felt, then remove them sooner. I also advised to discard the used one safely.     I suggested to follow-up with his PCP regarding long term usage of high dose acetaminophen, and liver function assessment.  Post Treatment Note:   Khrystian tolerated the all the treatment well without any complications.      Time Spent:   - First set of treatment - Face to face time: 15 mins  - Second set of treatment - Face to face time: 8 mins  Total: Time spent for face-to-face for acupuncture: 23 minutes.      Augusto Gamble, AEMP, DACM, LAc  Licensed Acupuncturist, Paramedic for [employer]  Department of Family medicine  [place] [facility] [place]

## 2023-01-31 ENCOUNTER — Emergency Department (EMERGENCY_DEPARTMENT_HOSPITAL): Payer: Medicare PPO

## 2023-01-31 ENCOUNTER — Other Ambulatory Visit: Payer: Self-pay

## 2023-01-31 ENCOUNTER — Observation Stay (HOSPITAL_COMMUNITY): Payer: Self-pay | Admitting: Unknown Physician Specialty

## 2023-01-31 ENCOUNTER — Observation Stay
Admission: EM | Admit: 2023-01-31 | Discharge: 2023-02-01 | Disposition: A | Discharge status: Home / Self Care | Payer: Medicare PPO | Attending: Student in an Organized Health Care Education/Training Program | Admitting: Student in an Organized Health Care Education/Training Program

## 2023-01-31 DIAGNOSIS — R55 Syncope and collapse: Secondary | ICD-10-CM | POA: Insufficient documentation

## 2023-01-31 DIAGNOSIS — J9811 Atelectasis: Secondary | ICD-10-CM

## 2023-01-31 DIAGNOSIS — M542 Cervicalgia: Secondary | ICD-10-CM | POA: Insufficient documentation

## 2023-01-31 DIAGNOSIS — T428X4A Poisoning by antiparkinsonism drugs and other central muscle-tone depressants, undetermined, initial encounter: Secondary | ICD-10-CM | POA: Insufficient documentation

## 2023-01-31 DIAGNOSIS — I672 Cerebral atherosclerosis: Secondary | ICD-10-CM | POA: Insufficient documentation

## 2023-01-31 DIAGNOSIS — R9431 Abnormal electrocardiogram [ECG] [EKG]: Secondary | ICD-10-CM | POA: Insufficient documentation

## 2023-01-31 DIAGNOSIS — R7402 Elevation of levels of lactic acid dehydrogenase (LDH): Secondary | ICD-10-CM | POA: Insufficient documentation

## 2023-01-31 DIAGNOSIS — G8929 Other chronic pain: Secondary | ICD-10-CM | POA: Insufficient documentation

## 2023-01-31 DIAGNOSIS — K5730 Diverticulosis of large intestine without perforation or abscess without bleeding: Secondary | ICD-10-CM | POA: Insufficient documentation

## 2023-01-31 DIAGNOSIS — F10929 Alcohol use, unspecified with intoxication, unspecified: Secondary | ICD-10-CM | POA: Insufficient documentation

## 2023-01-31 DIAGNOSIS — S6992XA Unspecified injury of left wrist, hand and finger(s), initial encounter: Secondary | ICD-10-CM

## 2023-01-31 DIAGNOSIS — X58XXXA Exposure to other specified factors, initial encounter: Secondary | ICD-10-CM | POA: Insufficient documentation

## 2023-01-31 DIAGNOSIS — T444X1A Poisoning by predominantly alpha-adrenoreceptor agonists, accidental (unintentional), initial encounter: Secondary | ICD-10-CM | POA: Insufficient documentation

## 2023-01-31 DIAGNOSIS — R7889 Finding of other specified substances, not normally found in blood: Secondary | ICD-10-CM | POA: Insufficient documentation

## 2023-01-31 DIAGNOSIS — F1090 Alcohol use, unspecified, uncomplicated: Secondary | ICD-10-CM | POA: Insufficient documentation

## 2023-01-31 DIAGNOSIS — N179 Acute kidney failure, unspecified: Secondary | ICD-10-CM | POA: Insufficient documentation

## 2023-01-31 DIAGNOSIS — M5417 Radiculopathy, lumbosacral region: Secondary | ICD-10-CM | POA: Insufficient documentation

## 2023-01-31 DIAGNOSIS — I959 Hypotension, unspecified: Principal | ICD-10-CM | POA: Insufficient documentation

## 2023-01-31 DIAGNOSIS — Z8673 Personal history of transient ischemic attack (TIA), and cerebral infarction without residual deficits: Secondary | ICD-10-CM | POA: Insufficient documentation

## 2023-01-31 LAB — CBC, DIFF
% Basophils: 1 %
% Eosinophils: 3 %
% Immature Granulocytes: 0 %
% Lymphocytes: 31 %
% Monocytes: 12 %
% Neutrophils: 53 %
% Nucleated RBC: 0 %
Absolute Eosinophil Count: 0.19 10*3/uL (ref 0.00–0.50)
Absolute Lymphocyte Count: 2.03 10*3/uL (ref 1.00–4.80)
Basophils: 0.07 10*3/uL (ref 0.00–0.20)
Hematocrit: 41 % (ref 38.0–50.0)
Hemoglobin: 13.4 g/dL (ref 13.0–18.0)
Immature Granulocytes: 0.01 10*3/uL (ref 0.00–0.05)
MCH: 33.9 pg — ABNORMAL HIGH (ref 27.3–33.6)
MCHC: 32.7 g/dL (ref 32.2–36.5)
MCV: 104 fL — ABNORMAL HIGH (ref 81–98)
Monocytes: 0.76 10*3/uL (ref 0.00–0.80)
Neutrophils: 3.43 10*3/uL (ref 1.80–7.00)
Nucleated RBC: 0 10*3/uL
Platelet Count: 220 10*3/uL (ref 150–400)
RBC: 3.95 10*6/uL — ABNORMAL LOW (ref 4.40–5.60)
RDW-CV: 13.7 % (ref 11.0–14.5)
WBC: 6.49 10*3/uL (ref 4.3–10.0)

## 2023-01-31 LAB — URINALYSIS WITH REFLEX CULTURE
Bacteria, URN: NONE SEEN
Bilirubin (Qual), URN: NEGATIVE
Epith Cells_Renal/Trans,URN: NEGATIVE /HPF
Epith Cells_Squamous, URN: NEGATIVE /LPF
Glucose Qual, URN: NEGATIVE
Ketones, URN: NEGATIVE
Leukocyte Esterase, URN: NEGATIVE
Nitrite, URN: NEGATIVE
Occult Blood, URN: NEGATIVE
Specific Gravity, URN: 1.04 g/mL — ABNORMAL HIGH (ref 1.006–1.027)
WBC, URN: NEGATIVE /HPF
pH, URN: 6.5 (ref 5.0–8.0)

## 2023-01-31 LAB — STANDARD DRUG SCREEN, URN
Acetaminophen Qualitative, URN: POSITIVE — AB
Alcohol (Ethyl), URN: 120 mg/dL — AB
Amphet/Methamphetamine Qual,URN: NEGATIVE
Barbiturate (Qual), URN: POSITIVE — AB
Benzodiazepines (Qual), URN: NEGATIVE
Cannabinoids (Qual), URN: NEGATIVE
Cocaine (Qual), URN: NEGATIVE
Fentanyl Qual, URN: NEGATIVE
Methadone (Qual), URN: NEGATIVE
Opiates (Qual), URN: NEGATIVE
Phencyclidine (Qual), URN: NEGATIVE
Tricyclic Antidepressants, URN: NEGATIVE

## 2023-01-31 LAB — COMPREHENSIVE METABOLIC PANEL
ALT (GPT): 28 U/L (ref 10–48)
AST (GOT): 33 U/L (ref 9–38)
Albumin: 3.8 g/dL (ref 3.5–5.2)
Alkaline Phosphatase (Total): 64 U/L (ref 36–161)
Anion Gap: 10 (ref 4–12)
Bilirubin (Total): 0.2 mg/dL (ref 0.2–1.3)
Calcium: 8.6 mg/dL — ABNORMAL LOW (ref 8.9–10.2)
Carbon Dioxide, Total: 26 meq/L (ref 22–32)
Chloride: 104 meq/L (ref 98–108)
Creatinine: 1.48 mg/dL — ABNORMAL HIGH (ref 0.51–1.18)
Glucose: 117 mg/dL (ref 62–125)
Potassium: 4.6 meq/L (ref 3.6–5.2)
Protein (Total): 6 g/dL (ref 6.0–8.2)
Sodium: 140 meq/L (ref 135–145)
Urea Nitrogen: 23 mg/dL — ABNORMAL HIGH (ref 8–21)
eGFR by CKD-EPI 2021: 51 mL/min/{1.73_m2} — ABNORMAL LOW (ref >59)

## 2023-01-31 LAB — EKG 12 LEAD
Atrial Rate: 61 {beats}/min
P-R Interval: 170 ms
QRS Duration: 80 ms
T Axis: 80 degrees
Ventricular Rate: 61 {beats}/min

## 2023-01-31 LAB — ALCOHOL (ETHYL): Alcohol (Ethyl): 161 mg/dL — AB

## 2023-01-31 LAB — LIPASE: Lipase: 46 U/L (ref <70)

## 2023-01-31 LAB — TROPONIN_I
Troponin_I Interpretation: NORMAL
Troponin_I: <0.03 ng/mL (ref <0.04)

## 2023-01-31 LAB — 1ST EXTRA GOLD TOP

## 2023-01-31 LAB — PROTHROMBIN TIME
Prothrombin INR: 0.9 (ref 0.8–1.3)
Prothrombin Time Patient: 12.8 s (ref 10.7–15.6)

## 2023-01-31 LAB — L LACTATE, VENOUS WB (TO U/H LAB WITHIN 30 MIN)
L Lactate (Direct), Venous Whole Blood: 3 mmol/L — ABNORMAL HIGH (ref 0.6–1.9)
L Lactate (Direct), Venous Whole Blood: 1.9 mmol/L (ref 0.6–1.9)

## 2023-01-31 LAB — PARTIAL THROMBOPLASTIN TIME: Partial Thromboplastin Time: 28 s (ref 22–35)

## 2023-01-31 LAB — GLUCOSE, FINGERSTICK POC: Glucose, Finger Stick POC: 109 mg/dL (ref 62–125)

## 2023-01-31 LAB — B_TYPE NATRIURETIC PEPTIDE: B_Type Natriuretic Peptide: 85 pg/mL (ref <101)

## 2023-01-31 MED ORDER — LACTATED RINGERS BOLUS
1000.0000 mL | Freq: Once | INTRAVENOUS | Status: AC
Start: 2023-01-31 — End: 2023-01-31
  Administered 2023-01-31: 1000 mL via INTRAVENOUS

## 2023-01-31 MED ORDER — CEFTRIAXONE 2 G IN NS 50 ML IVPB MB-PLUS (SIMPLE)
2.0000 g | INJECTION | Freq: Once | Status: AC
Start: 2023-01-31 — End: 2023-01-31
  Administered 2023-01-31: 2 g via INTRAVENOUS
  Filled 2023-01-31: qty 50

## 2023-01-31 MED ORDER — IOHEXOL 350 MG/ML IV SOLN
100.0000 mL | Freq: Once | INTRAVENOUS | Status: AC
Start: 2023-01-31 — End: 2023-01-31
  Administered 2023-01-31: 100 mL via INTRAVENOUS

## 2023-01-31 NOTE — H&P (Signed)
History and Physical     Ronnie Mason") - DOB: [birth year] 72 year old male)  Gender Identity: Male  Pronouns: he/him/his, patient's name  PCP: Ronnie Medal, MD {Vanishing Tip  Care Team :999}  Code Status: Needs Review       CHIEF CONCERN / IDENTIFICATION:     Ronnie Mason is a 72 year old male with ***     SUBJECTIVE   HISTORY OF PRESENT ILLNESS:   ***    Lab workup is notable for macrocytosis,, normal INR, normal troponin, normal BNP, normal lipase, BAC 161, CMP with creatinie 1.48, urine drug screen positive for barbiturates    HISTORY {Vanishing Tip  Prob List  Medical Hx  Surgical Hx  Family Hx  Subst & Sexual Hx  Social Doc :999}  Past Medical History:  ***    Past Surgical History:  ***    Social History:  ***    Family History:  ***    OUTPATIENT MEDICATIONS: {Vanishing Tip  Home Meds  MAR Summary :999}  Current Outpatient Medications   Medication Instructions    acetaminophen (TYLENOL) 1,000 mg, Oral, 2 times daily    albuterol HFA (ProAir HFA) 108 (90 Base) MCG/ACT inhaler 2 puffs, Inhalation, Every 4 hours PRN, Dispense with spacer    Alpha-Lipoic Acid 300 MG tablet 1 tablet, Oral, Every morning, Benfotiamine- Alpha Lipoic acid     amLODIPine (NORVASC) 5 mg, Oral, 2 times daily    atorvastatin (LIPITOR) 10 mg, Oral, Daily    BENFOTIAMINE OR 250 mg, Oral, Every morning    betamethasone dipropionate 0.05 % lotion APPLY TO SCALP AND EARS TOPICALLY 2 TIMES A DAY AS NEEDED FOR ITCHING. Notify MD of any signs of infection or skin thinning    clindamycin 1 % gel 1 Application , Topical, 2 times daily PRN, Apply to back.    Cyanocobalamin 1000 MCG Oral Tab one per day over-the-counter started 5/21 /2012 this level borderline low    diclofenac (VOLTAREN) 4 g, Topical, 4 times daily, Apply to 2 gram four times daily to neck, trapezius muscle, and low back. Use a max of 32 grams a day.    DULoxetine (CYMBALTA) 60 mg, Oral, 2 times daily    EPINEPHrine 0.3 MG/0.3ML auto-injector  Inject one pen (0.3mg /mL) into the muscle for severe allergic reaction. Call 911. Use second pen if symptoms continue    ibuprofen (MOTRIN) 200 mg, Oral, Daily PRN    lidocaine 5 % patch 1 patch, Transdermal, Daily, Apply to painful area for up to 12 hours in a 24 hour period.    lisinopril (PRINIVIL; ZESTRIL) 20 mg, Oral, 2 times daily    MAGNESIUM CITRATE OR Oral, Nightly    mirabegron ER (MYRBETRIQ) 50 mg, Oral, Daily    multivitamin with minerals tablet 1 tablet, Oral, Daily    naltrexone 50 MG tablet TAKE 1 TABLET BY MOUTH DAILY WITH FOOD    Omega-3 Fatty Acids (OMEGA 3 OR) 1 tablet, Oral, Every morning    pregabalin (LYRICA) 300 mg, Oral, 2 times daily    primidone (MYSOLINE) 50 mg, Oral, 2 times daily    tiZANidine (ZANAFLEX) 4 mg, Oral, Nightly    Vitamin D3 2,000 units, Oral, Daily, For low level       ALLERGIES: {Vanishing Tip  Update/review allergies :999}  Adhesives, Bee venom, Gabapentin, and Methocarbamol      OBJECTIVE     {Vanishing Tip  Summary  Graph  Flowsheet  :999}  T: 36.3 C (01/31/23 2042)  BP: (!) 81/56 (01/31/23 2033)  HR: 63 (01/31/23 2033)  RR: 18 (01/31/23 2033)  SpO2: 92 % (01/31/23 2033) Nasal cannula 2 L/min     Vitals (Most recent in last 24 hrs)   T: 36.3 C (01/31/23 2042)  BP: 124/80 (01/31/23 2300)  HR: 70 (01/31/23 2300)  RR: 18 (01/31/23 2300)  SpO2: 97 % (01/31/23 2300) Nasal cannula 2 L/min    T range: Temp  Min: 36.3 C  Max: 36.3 C  Wt 236 lb 5.3 oz (107.2 kg)     Ht 6\' 4"  (1.93 m)     Body mass index is 28.77 kg/m.       Physical Exam  ***    Labs (last 24 hours): {Vanishing Tip  Labs Since Admission  Glycemic Control  :999}  Chemistries  CBC  LFT  Gases, other   140 104 23 117   13.4   AST: 33 ALT: 28  -/-/-/-  -/-/-/-   4.6 26 1.48   6.49 >< 220  AP: 64 T bili: 0.2  Lact (a): - Lact (v): 3.0   eGFR: 51 Ca: 8.6   41   Prot: 6.0 Alb: 3.8  Trop I: <0.03 D-dimer: -   Mg: - PO4: -  ANC: 3.43     BNP: 85 Anti-Xa: -     ALC: 2.03    INR: 0.9        {Data Review  (.DATAREVIEWIP)  :114283}        ASSESSMENT/PLAN    {Vanishing Tip  Problem List  Hospital Course  To add hospital problem list, type .HPROBL :999}  ***    #Macrocytosis    #AKI    #positive barbituate       {Inpatient Checklist (Optional):114660}      {VT  E&M Level of Service is now determined by either total time or complexity of Medical Decision Making (MDM).    [facility]'s MDM Guide (Tool for E&M code prediction; does not document or place charges)     [facility]'s MDM tip sheet (Guide to 2023 time thresholds or MDM elements)   Type .TOTALTIME to document by time  Type .DATAREVIEWTABLE to indicate data reviewed) :999}

## 2023-01-31 NOTE — ED Provider Notes (Signed)
CHIEF COMPLAINT   Chief Complaint   Patient presents with   . Syncope        HISTORY OF PRESENT ILLNESS AND REVIEW OF SYSTEMS     72 year old male with history of hypertension, CVA, alcohol use disorder, renal impairment, chart history of syncope, presenting with syncope.  History is obtained from the patient and from paramedics.  Reportedly was eating pizza this evening when he suddenly lost consciousness.  No apparent choking activity.  Paramedics arrived to find the patient hypotensive and bradycardic.  No CPR was performed.  Patient was started on IV fluids.  Reports that he was drinking beer and vodka today which she does daily.  Also recently was started on tizanidine and took 2-3 doses of tizanidine today.  At this time he denies any head or neck pain, chest pain, shortness of breath, abdominal pain, bloody stool or other medical complaints.    Review of systems as noted above in HPI otherwise other systems reviewed and found to be negative or noncontributory.      PAST MEDICAL AND SURGICAL HISTORY   Past Medical History:   Diagnosis Date   . Cellulitis and abscess of foot 12/02/2020    Added automatically from request for surgery [id number]   . Vitamin D deficiency 02/13/2016   . 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 Essential tremor 12/16/2015   . Essential hypertension 12/16/2015   . Intraparenchymal hemorrhage of brain (HCC) 2017   . Stroke [facility]) 2017    intraparenchymal hemorrhage , no residual symptoms .   . Idiopathic peripheral neuropathy 03/15/2015   . 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 Back pain, lumbosacral 02/15/2013   . B12 deficiency 12/21/2012    Borderline low 5 /21/2014   . Chronic renal insufficiency, stage III (moderate) (HCC) 12/21/2012    5/ 2013   . Hyperlipidemia 09/26/2012   . Cervicalgia 03/11/2010    Radiofrequency ablation of c3 to c 7   . Allergic rhinitis due to allergen    . Carotid Sinus  Hypersensitivity    . Chronic pain     chronic LUE pain had injection.   . Disc disorder of lumbosacral region    . Hip injury    . HYPERTENSION     . 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[facility])       Patient Active Problem List   Diagnosis   . Syncope   . Hypotension   . Carotid Sinus Hypersensitivity   . URI (upper respiratory infection)   . Cervicalgia   . Hyperlipidemia   . Chronic pain   . Bee sting allergy   . Numbness and tingling of leg   . Chronic back pain   . B12 deficiency   . Chronic renal insufficiency, stage III (moderate) (HCC)   . Back pain, lumbosacral   . Lumbar radiculopathy, chronic   . Neck pain   . Fall   . Tremor   . Sensory neuropathy   . Spondylosis of cervical region without myelopathy or radiculopathy   . Idiopathic peripheral neuropathy   . 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)   . History of alcohol dependence (HCC)   . Cerebral ventriculomegaly   . Cerebellar hypoplasia (HCC)   . Essential hypertension   . Essential tremor   . Hx of spontan intraparenchymal intracran bleed assoc with hypertension   . Alcohol abuse   . 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  Balance problem   . Chronic pain of both shoulders   . Vitamin D deficiency   . Difficulty in walking, not elsewhere classified   . Impaired mobility and ADLs   . History of traumatic brain injury   . Primary osteoarthritis of right hip   . Mild vascular neurocognitive disorder   . Right lumbar radiculitis   . Greater trochanteric pain syndrome of right lower extremity   . Complex tear of medial meniscus of left knee as current injury   . Primary osteoarthritis of left knee   . Acute left-sided low back pain without sciatica   . Chronic left-sided low back pain without sciatica   . Enuresis   . Closed fracture of one rib of right side   . MRSA (methicillin resistant staph aureus) culture positive   . Myofascial pain   . Macrocytosis without anaemia   . Psychological factors affecting medical condition   . Chronic  bilateral low back pain without sciatica   . Mallet toe, right   . Neurogenic bladder   . BPH with obstruction/lower urinary tract symptoms   . Urge incontinence   . Cerebral microvascular disease   . Mixed incontinence     Past Surgical History:   Procedure Laterality Date   . L meniciscus Left 07/2017    by Dr Marcelle Overlie , re did left menisicus    . PR ANES; COLONOSCOPY  2002    repeat in 3 years   . PR ANES; COLONOSCOPY & POLYPECTOMY  11/18/2007    repeat in 3 years   . PR CORRECTION HAMMERTOE Right 05/13/2022    Dr. Hilarie Fredrickson, DPM   . PR HALLUX RIGIDUS W/CHEILECTOMY 1ST MP JT W/O IMPLT Right 10/21/2017    Dr. Donn Pierini   . PR UNLISTED PROCEDURE FEMUR/KNEE     . PR UNLISTED PROCEDURE HANDS/FINGERS     . PR UNLISTED PROCEDURE SPINE  2013    rfa to c3 to c 7    . SURGICAL HX OTHER Right 12/03/2020    RIGHT foot WOUND INCISION AND DRAINAGE   . TOE SURGERY Right 02/09/2019   . toe surgery  Right 01/12/2019    Dr Hilarie Fredrickson          MEDICATIONS AND ALLERGIES   Current Outpatient Medications   Medication Instructions   . acetaminophen (TYLENOL) 1,000 mg, Oral, 2 times daily   . albuterol HFA (ProAir HFA) 108 (90 Base) MCG/ACT inhaler 2 puffs, Inhalation, Every 4 hours PRN, Dispense with spacer   . Alpha-Lipoic Acid 300 MG tablet 1 tablet, Oral, Every morning, Benfotiamine- Alpha Lipoic acid    . amLODIPine (NORVASC) 5 mg, Oral, 2 times daily   . atorvastatin (LIPITOR) 10 mg, Oral, Daily   . BENFOTIAMINE OR 250 mg, Oral, Every morning   . betamethasone dipropionate 0.05 % lotion APPLY TO SCALP AND EARS TOPICALLY 2 TIMES A DAY AS NEEDED FOR ITCHING. Notify MD of any signs of infection or skin thinning   . clindamycin 1 % gel 1 Application , Topical, 2 times daily PRN, Apply to back.   . Cyanocobalamin 1000 MCG Oral Tab one per day over-the-counter started 5/21 /2012 this level borderline low   . diclofenac (VOLTAREN) 4 g, Topical, 4 times daily, Apply to 2 gram four times daily to neck, trapezius muscle, and low back. Use  a max of 32 grams a day.   . DULoxetine (CYMBALTA) 60 mg, Oral, 2 times daily   . EPINEPHrine 0.3 MG/0.3ML  auto-injector Inject one pen (0.3mg /mL) into the muscle for severe allergic reaction. Call 911. Use second pen if symptoms continue   . ibuprofen (MOTRIN) 200 mg, Oral, Daily PRN   . lidocaine 5 % patch 1 patch, Transdermal, Daily, Apply to painful area for up to 12 hours in a 24 hour period.   [name] lisinopril (PRINIVIL; ZESTRIL) 20 mg, Oral, 2 times daily   . MAGNESIUM CITRATE OR Oral, Nightly   . mirabegron ER (MYRBETRIQ) 50 mg, Oral, Daily   . multivitamin with minerals tablet 1 tablet, Oral, Daily   . naltrexone 50 MG tablet TAKE 1 TABLET BY MOUTH DAILY WITH FOOD   . Omega-3 Fatty Acids (OMEGA 3 OR) 1 tablet, Oral, Every morning   . pregabalin (LYRICA) 300 mg, Oral, 2 times daily   . primidone (MYSOLINE) 50 mg, Oral, 2 times daily   . tiZANidine (ZANAFLEX) 4 mg, Oral, Nightly   . Vitamin D3 2,000 units, Oral, Daily, For low level       Review of patient's allergies indicates:  Allergies   Allergen Reactions   . Adhesives Skin: Rash     Glue on lido patches   . Bee Venom Skin: Hives, Skin: Itching and Swelling   . Gabapentin Other     Flu like symptoms, diarrhea, body ache upset stomach   . Methocarbamol Other     Patient's wife reports cognitive difficulties ("goofy")          SOCIAL HISTORY AND FAMILY HISTORY   Social History     Tobacco Use   . Smoking status: Never   . Smokeless tobacco: Never   . Tobacco comments:     Sometimes chews, but is trying to quit. 11/28/21   Substance Use Topics   . Alcohol use: Yes     Alcohol/week: 3.0 standard drinks of alcohol     Types: 3 Glasses of wine per week     Comment: goal to get down to 2 per day   . Drug use: Yes     Frequency: 3.0 times per week     Types: Marijuana     Comment: CBD (Occassionally)     Lives in [place]    Family History       Problem (# of Occurrences) Relation (Name,Age of Onset)    Heart (other) (1) Mother: MI    Other Family Hx (1) Other:  myelodysplasia    Colon Cancer (1) Father    Suicide Attempt (1) Brother: died of suicide           Negative family history of: Cancer               PHYSICAL EXAM   ED VITALS:  Vitals (Arrival)   {Vanishing Tip  Summary  Graph  Flowsheet  :999}   T: (not recorded)  BP: (!) 81/56 (01/31/23 2033)  HR: 63 (01/31/23 2033)  RR: 18 (01/31/23 2033)  SpO2: 92 % (01/31/23 2033)     Vitals (Most recent in last 24 hrs)   T: (not recorded)  BP: (!) 81/56 (01/31/23 2033)  HR: 63 (01/31/23 2033)  RR: 18 (01/31/23 2033)  SpO2: 94 % (01/31/23 2036) Room air  T range: No data recorded  (no weight taken for this visit)     (no height taken for this visit)     There is no height or weight on file to calculate BMI.     General- Lying in bed, tired appearing, slightly slurred speech, no respiratory distress  Eyes - EOMI, PERRL  HEENT - Clear oropharynx, neck supple without meningismus, abrasion on the forehead, no spine tenderness or step-off  Cardiovascular - RRR, warm and well perfused   Lungs - Breathing comfortably on room air, no audible stridor  Abdomen - Abdomen soft and nontender, no rebound or guarding, no abrasions or ecchymosis  Back - No midline tenderness  Extremities - Radial pulses 2+  Skin - Warm and dry, bruising noted to the dorsum of the bilateral hands  Musculoskeletal - No deformity, no swollen or erythematous joints.   Neurological -somnolent but arousable, face symmetric, grossly intact strength in the upper and lower extremities         LABORATORY:   Labs Reviewed   CBC, DIFF   PROTHROMBIN TIME   PARTIAL THROMBOPLASTIN TIME   COMPREHENSIVE METABOLIC PANEL   TROPONIN_I   B_TYPE NATRIURETIC PEPTIDE   L LACTATE, VENOUS WB (TO U/H LAB WITHIN 30 MIN)   LIPASE   URINALYSIS WITH REFLEX CULTURE   ALCOHOL (ETHYL)   STANDARD DRUG SCREEN, URN   BLOOD C/S   BLOOD C/S         IMAGING:     ED Wet Read -   CTA Head and Neck Angio    (Results Pending)   CTA Chest Abdomen Pelvis Angio    (Results Pending)   CT C Spine wo  Contrast    (Results Pending)       Radiology Final Result -   No image results found.            ECG DOCUMENTATION     My interpretation as follows:    Sinus rhythm rate 61 bpm, right axis, QRS 80 ms, QTc 436 ms, no STEMI or malignant arrhythmia       SUICIDE RISK EVALUATION    {SI - For a patient that screens positive on the CSRSS (Optional):160000124}       SEPSIS    {Sepsis (Optional):160000130}       ED COURSE / MEDICAL DECISION MAKING     Aylan Partington is a 72 year old year-old male presenting with syncope in the setting of drinking alcohol and taking muscle relaxing medication. Upon arrival the patient is borderline hypotensive, afebrile, exam as above.    Differential diagnosis includes ACS, heart failure exacerbation, cardiogenic shock, PE, aortic dissection, dehydration, hemorrhagic shock, sepsis, neurogenic shock, intoxication, and others.    Labs, EKG, CT imaging ordered.  IV fluids administered.    Prior external records reviewed including: Recent pain medicine clinic note    History included independent historians: Paramedic    Labs reviewed including: ***    My interpretation of radiographic findings: ***    Social determinants of health that affected patient's care today include: ***    Patient care and management discussed with: ***     Decision regarding hospitalization: ***    {Was an Trauma/Stroke/STEMI Activation performed? (Optional):160000123}    ED Course as of 01/31/23 2327   Wynelle Link Jan 31, 2023   2113 Given hypotension with a lactate of 3, patient receiving second liter of IV fluids after receiving 300 mL by paramedics and also given broad-spectrum antibiotics, ceftriaxone, after blood cultures were obtained for sepsis although suspect this is more likely related to tizanidine given bradycardia, hypotension, refractory to fluids [ZW]   2327 L Lactate (Direct), Venous Whole Blood: 1.9  Repeat lactate has normalized after IV fluid administration. [ZW]      ED Course User Index  [ZW]  Delight Ovens, MD       In the ED, patient was given:    Medications   lactated ringers IV Bolus 1,000 mL (has no administration in time range)            CLINICAL IMPRESSION / DISPOSITION     Clinical Impressions:   None       Follow-up with:  No follow-up provider specified.    New Medication Prescriptions:  New Prescriptions    No medications on file       Findings for PCP Follow-up:  ***    Disposition: {Emergency Department Dispotion:118497}    Lin Givens, MD       CRITICAL CARE     Critical Care - {Critical Care Yes/No:160000121}

## 2023-01-31 NOTE — ED Notes (Signed)
Care of plan reviewed with ER MD at bedside. Per ER MD, ok to waive labs and go to CT once pt blood pressures improve.  Pt endorses taking tizanipine x2. 1 each       Kathaleen Bury, RN  01/31/23 2048

## 2023-01-31 NOTE — ED Triage Notes (Addendum)
Pt [facility] from home. Pt wife fed pizza, slept on couch and went unconscious. Medics   Found pt on the floor. Scene vitals: 72/p, HR 48, BG 85. Only responsive to pain.  Meics administered LR. Pt BP 104/46 and more alert.     In triage, pt does not recall event. Pt endorses ETOH use. Bruising/swelling noted to L hand and to RUE.    Hx alcohol abuse disorder, hypertension    Per Medics, Pt new prescription for tizanipine, unclear of dosage they took this AM.

## 2023-02-01 DIAGNOSIS — R9431 Abnormal electrocardiogram [ECG] [EKG]: Secondary | ICD-10-CM

## 2023-02-01 DIAGNOSIS — R7402 Elevation of levels of lactic acid dehydrogenase (LDH): Secondary | ICD-10-CM

## 2023-02-01 LAB — BASIC METABOLIC PANEL
Anion Gap: 9 (ref 4–12)
Calcium: 9.5 mg/dL (ref 8.9–10.2)
Carbon Dioxide, Total: 30 meq/L (ref 22–32)
Chloride: 101 meq/L (ref 98–108)
Creatinine: 0.94 mg/dL (ref 0.51–1.18)
Glucose: 81 mg/dL (ref 62–125)
Potassium: 4.2 meq/L (ref 3.6–5.2)
Sodium: 140 meq/L (ref 135–145)
Urea Nitrogen: 18 mg/dL (ref 8–21)
eGFR by CKD-EPI 2021: >60 mL/min/{1.73_m2} (ref >59)

## 2023-02-01 LAB — CBC (HEMOGRAM)
Hematocrit: 45 % (ref 38.0–50.0)
Hemoglobin: 14.8 g/dL (ref 13.0–18.0)
MCH: 33.9 pg — ABNORMAL HIGH (ref 27.3–33.6)
MCHC: 32.7 g/dL (ref 32.2–36.5)
MCV: 104 fL — ABNORMAL HIGH (ref 81–98)
Platelet Count: 201 10*3/uL (ref 150–400)
RBC: 4.37 10*6/uL — ABNORMAL LOW (ref 4.40–5.60)
RDW-CV: 13.7 % (ref 11.0–14.5)
WBC: 4.66 10*3/uL (ref 4.3–10.0)

## 2023-02-01 LAB — EKG 12 LEAD
P Axis: 35 degrees
Q-T Interval: 434 ms
QTC Calculation: 436 ms
R Axis: 92 degrees

## 2023-02-01 LAB — BLOOD C/S: Culture: NO GROWTH

## 2023-02-01 LAB — MAGNESIUM: Magnesium: 1.6 mg/dL — ABNORMAL LOW (ref 1.8–2.4)

## 2023-02-01 MED ORDER — NALTREXONE HCL 50 MG OR TABS
50.0000 mg | ORAL_TABLET | Freq: Every day | ORAL | Status: DC
Start: 2023-02-01 — End: 2023-02-01
  Administered 2023-02-01: 50 mg via ORAL
  Filled 2023-02-01 (×2): qty 1

## 2023-02-01 MED ORDER — PREGABALIN 150 MG OR CAPS
300.0000 mg | ORAL_CAPSULE | Freq: Two times a day (BID) | ORAL | Status: DC
Start: 2023-02-01 — End: 2023-02-01

## 2023-02-01 MED ORDER — ACETAMINOPHEN 325 MG OR TABS
650.0000 mg | ORAL_TABLET | ORAL | Status: DC | PRN
Start: 2023-02-01 — End: 2023-02-01
  Administered 2023-02-01: 650 mg via ORAL
  Filled 2023-02-01: qty 2

## 2023-02-01 MED ORDER — ONDANSETRON HCL 4 MG/2ML IJ SOLN
4.0000 mg | Freq: Three times a day (TID) | INTRAMUSCULAR | Status: DC | PRN
Start: 2023-02-01 — End: 2023-02-01

## 2023-02-01 MED ORDER — HEPARIN SODIUM (PORCINE) 5000 UNIT/ML IJ SOLN
5000.0000 [IU] | Freq: Two times a day (BID) | INTRAMUSCULAR | Status: DC
Start: 2023-02-01 — End: 2023-02-01
  Administered 2023-02-01: 5000 [IU] via SUBCUTANEOUS
  Filled 2023-02-01: qty 1

## 2023-02-01 MED ORDER — LISINOPRIL 10 MG OR TABS
20.0000 mg | ORAL_TABLET | Freq: Two times a day (BID) | ORAL | Status: DC
Start: 2023-02-01 — End: 2023-02-01

## 2023-02-01 MED ORDER — ONDANSETRON HCL 4 MG OR TABS
4.0000 mg | ORAL_TABLET | Freq: Three times a day (TID) | ORAL | Status: DC | PRN
Start: 2023-02-01 — End: 2023-02-01

## 2023-02-01 MED ORDER — MELATONIN 3 MG OR TABS
3.0000 mg | ORAL_TABLET | Freq: Every evening | ORAL | Status: DC | PRN
Start: 2023-02-01 — End: 2023-02-01

## 2023-02-01 MED ORDER — PRIMIDONE 50 MG OR TABS
50.0000 mg | ORAL_TABLET | Freq: Two times a day (BID) | ORAL | Status: DC
Start: 2023-02-01 — End: 2023-02-01
  Administered 2023-02-01: 50 mg via ORAL
  Filled 2023-02-01: qty 1

## 2023-02-01 MED ORDER — SENNOSIDES 8.6 MG OR TABS
17.2000 mg | ORAL_TABLET | Freq: Two times a day (BID) | ORAL | Status: DC | PRN
Start: 2023-02-01 — End: 2023-02-01

## 2023-02-01 MED ORDER — POLYETHYLENE GLYCOL 3350 17 G OR PACK
17.0000 g | PACK | Freq: Every day | ORAL | Status: DC | PRN
Start: 2023-02-01 — End: 2023-02-01

## 2023-02-01 MED ORDER — DULOXETINE HCL 60 MG OR CPEP
60.0000 mg | DELAYED_RELEASE_CAPSULE | Freq: Two times a day (BID) | ORAL | Status: DC
Start: 2023-02-01 — End: 2023-02-01
  Administered 2023-02-01: 60 mg via ORAL
  Filled 2023-02-01 (×2): qty 1

## 2023-02-01 MED ORDER — MIRABEGRON ER 25 MG OR TB24
50.0000 mg | EXTENDED_RELEASE_TABLET | Freq: Every day | ORAL | Status: DC
Start: 2023-02-01 — End: 2023-02-01
  Administered 2023-02-01: 50 mg via ORAL
  Filled 2023-02-01: qty 2

## 2023-02-01 MED ORDER — MAGNESIUM SULFATE 4 GM/50ML IV SOLN
4.0000 g | Freq: Once | INTRAVENOUS | Status: AC
Start: 2023-02-01 — End: 2023-02-01
  Administered 2023-02-01: 4 g via INTRAVENOUS
  Filled 2023-02-01: qty 50

## 2023-02-01 MED ORDER — DICLOFENAC SODIUM 1 % EX GEL
4.0000 g | Freq: Four times a day (QID) | CUTANEOUS | Status: DC | PRN
Start: 2023-02-01 — End: 2023-02-01

## 2023-02-01 MED ORDER — AMLODIPINE BESYLATE 5 MG OR TABS
5.0000 mg | ORAL_TABLET | Freq: Two times a day (BID) | ORAL | Status: DC
Start: 2023-02-01 — End: 2023-02-01

## 2023-02-01 MED ORDER — THIAMINE HCL 100 MG/ML IJ SOLN
500.0000 mg | Freq: Every day | INTRAVENOUS | Status: DC
Start: 2023-02-01 — End: 2023-02-01
  Administered 2023-02-01: 500 mg via INTRAVENOUS
  Filled 2023-02-01: qty 5

## 2023-02-01 MED ORDER — MULTIVITAMIN-MINERALS OR TAB
1.0000 | ORAL_TABLET | Freq: Every day | ORAL | Status: DC
Start: 2023-02-01 — End: 2023-02-01
  Administered 2023-02-01: 1 via ORAL
  Filled 2023-02-01: qty 1

## 2023-02-01 MED ORDER — ATORVASTATIN CALCIUM 10 MG OR TABS
10.0000 mg | ORAL_TABLET | Freq: Every day | ORAL | Status: DC
Start: 2023-02-01 — End: 2023-02-01
  Administered 2023-02-01: 10 mg via ORAL
  Filled 2023-02-01: qty 1

## 2023-02-01 NOTE — Nursing Note (Signed)
Pt AOX4, Elevated BP 154/95, RA, on Tele , impulsive at times, cooperate w/care.Independent in rm. CIWA score 1, abrasion on L forehead OTA. Slight neck pain. PRN tylenol given. IV thiamine and Mg given. Ongoing POC.

## 2023-02-01 NOTE — Progress Notes (Addendum)
Illness Severity  Watch-ED Admit      Patient Summary  71 y.o. male ED Admitted at 0130am for experiencing a syncopy episode at home. Pt was brought in by EMS. According to his wife, pt had been drinking beer and vodka, and took Tizanidine. He had eaten pizza, then fell asleep on the   couch, rolled off the couch and fell to the floor. He was unresponsive, so she called EMS.    PMH: ETOH, TBI, CVA, HTN, HLD, hypotension, Chronic pain, Spondylosis of cervical region, Demyelinating changes of brain, MRSA (2020), OA of R Hip, bee sting allergy, BPH, Idiopathic peripheral neuropathy, essential tremor, mallet   toe R foot, Renal insufficiency Stage 3, Impaired mobility & ADLs (2017), enuresis....    Pt is A&Ox4, anxious and impulsive at times. SBP & DBP elevated 145/91, HR 78, Afebrile, satting 98% on RA. Remote Tele NSR w/ HR in 70s. Pt's gait, while steady, he appeared to shuffle his feet. CIWA scale ordered. 2am was 4. Pt   denied pain. Pt denied SOB. Pt denied nausea. Pt voiding. Abdomen distended and rounded, but soft. Hypoactive B.T.s. No edema BLE. Good pedal pulses. Skin warm and dry. An abrasion on L forehead. Pt denied headache. Pt denied any chest pain. Pt had   equal strength w/ grips on both hands. Full sensation. Bed alarm set. Pt educated to call for staff assistance, to not get out of bed on his own. Fall mat in place.      Action Items  -PIVs: 18g R AC & 20g R FA   -High fall risk  -CIWA scale        Situational Awareness  From home. Lives w/ wife.

## 2023-02-01 NOTE — Discharge Instructions (Addendum)
Dear Mr. Demlow,    You were admitted after you experienced a syncopal event. We suspect this may have been related to over taking your Tizanidine along with drinking alcohol.  Please be cautious when taking this medication and you can hold off until you follow up with your prescribing provider    We would recommend you continue following up with your PCP and spine/pain doctor for ongoing care not only regarding your alcohol use but also for your chronic cervical spine changes    Please follow up with your primary care doctor regarding your physical capability and on going follow up with your spine team whether you will need surgery.    Should you experience any worsening symptoms such as chest pain, shortness of breath, or worsening dizziness/lightheadedness we would recommend you seek medical evaluation as soon as possible. Please continue working with physical therapy when you can

## 2023-02-01 NOTE — Discharge Summary (Signed)
Discharge Summary     Ronnie Mason [name]") - DOB: Ronnie Mason 72 year old male)  Gender Identity: Male  Pronouns: he/him/his, patient's name  PCP: [name], MD   Code Status: Full Code        DATE OF ADMISSION: 01/31/2023  DATE OF DISCHARGE: 02/01/2023  DISCHARGE TEAM & ATTENDING: Internal Medicine & Zannie Kehr, MD     ADMISSION DIAGNOSIS: Syncope  DISCHARGE DIAGNOSIS: Presumed tizanidine overdose    PROBLEMS ADDRESSED DURING THIS HOSPITALIZATION:   Presumed tizanidine overdose  #Polypharmacy  #Elevated lactate (resolved)  #Hypotension  #Syncope  #Chronic back pain  #positive barbituate (primidone)  #AKI  #Alcohol use disorder  #Macrocytosis  #HTN:  #Depression  #HLD  #Overactive bladder  #Essential tremor  #CAD    DISCHARGE FOLLOW-UP VISITS/APPOINTMENTS:    Upcoming appointments at [facility]:  Future Appointments   Date Time Provider Department Center   02/12/2023 11:00 AM Augusto Gamble, Einar [place] [facility]   02/19/2023 11:00 AM Augusto Gamble, Einar [facility]heasant [facility]   02/26/2023 11:00 AM Augusto Gamble, Einar [facility]heasant [facility]   03/05/2023 11:00 AM Augusto Gamble, Einar [facility]   03/12/2023 11:00 AM Augusto Gamble, LAc [facility]   03/19/2023 11:00 AM Lebron Quam [facility]   03/26/2023 11:00 AM Augusto Gamble, Einar [place] [facility]   04/01/2023  3:00 PM Araceli Bouche, Lyla Son, MD [facility]   04/09/2023 11:00 AM Augusto Gamble, LAc [facility]        Additional follow-up:  No follow-up provider specified.    PENDING RESULTS THAT REQUIRE FOLLOW-UP (as of this summary):  Pending Labs       Order Current Status    Blood Culture, Peripheral #1 Preliminary result    Blood Culture, Peripheral #2 Preliminary result            DIAGNOSTIC STUDIES RECOMMENDED:  - follow up with PCP regarding cardiac health and timing of further stress testing    INCIDENTAL FINDINGS THAT REQUIRE FOLLOW-UP:       Issue: coronary calcification and decreased exercise capacity  Follow-up plan:  Follow up with PCP whether stress testing indicated in the future     Issue: Cervical spine degenerative changes  Follow-up plan: Patient to follow up with his specialists to determine whether surgery required      THERAPEUTIC RECOMMENDATIONS:   - would agree to hold off on tizanidine or at least be more cautious in the amount taken  - follow up with PCP for ongoing physical therapy     ALLERGIES:  Adhesives, Bee venom, Gabapentin, and Methocarbamol      DISCHARGE MEDICATIONS:   Discharge Medication List as of 02/01/2023 11:53 AM        CONTINUE these medications which have NOT CHANGED    Details   Acetaminophen 500 MG Oral Tab Take 3 tablets (1,500 mg) by mouth 2 times a day.Historical      albuterol HFA (ProAir HFA) 108 (90 Base) MCG/ACT inhaler Inhale 2 puffs by mouth every 4 hours as needed for shortness of breath/wheezing for up to 10 days. Dispense with spacerDisp-18 g, R-0, Normal      Alpha-Lipoic Acid 300 MG tablet Take 300 mg by mouth 2 times a day.Historical Med      amLODIPine 5 MG tablet Take 1 tablet (5 mg) by mouth 2 times a day.Disp-180 tablet, R-1, Normal      atorvastatin 10 MG tablet Take 1 tablet (10 mg) by mouth daily.Disp-90 tablet, R-3, Normal  BENFOTIAMINE OR Take 250 mg by mouth every morning.Historical Med      betamethasone dipropionate 0.05 % lotion APPLY TO SCALP AND EARS TOPICALLY 2 TIMES A DAY AS NEEDED FOR ITCHING. Notify MD of any signs of infection or skin thinningDisp-60 mL, R-5, Normal      Cholecalciferol (VITAMIN D3) 2000 units Oral Cap Take 1 capsule (2,000 Units) by mouth daily. For low level, Disp-1 capsule, R-1, Not Printed      clindamycin 1 % gel Apply 1 Application  topically 2 times a day as needed. Apply to back.Historical Med      cyanocobalamin (Vitamin B-12) 1000 MCG tablet Take 1 tablet (1,000 mcg) by mouth daily.Historical Med      diclofenac 1 % gel Apply 4 g topically 4 times a day. Apply to 2 gram four times daily to neck, trapezius muscle, and low back. Use a  max of 32 grams a day.Disp-300 g, R-4, Normal      DULoxetine 60 MG DR capsule Take 1 capsule (60 mg) by mouth 2 times a day.Disp-180 capsule, R-2, Normal      EPINEPHrine 0.3 MG/0.3ML auto-injector Inject one pen (0.3mg /mL) into the muscle for severe allergic reaction. Call 911. Use second pen if symptoms continueDisp-2 each, R-0, NormalSecond dispense is due to patient having second location      ibuprofen 200 MG tablet Take 2 tablets (400 mg) by mouth every 8 hours as needed for pain.Historical Med      lidocaine 5 % patch Apply 1 patch onto the skin daily. Apply to painful area for up to 12 hours in a 24 hour period.Disp-30 patch, R-2, Normal      lisinopril 20 MG tablet Take 1 tablet (20 mg) by mouth 2 times a day.Disp-180 tablet, R-3, Normal      MAGNESIUM OR Take 2 tablets by mouth at bedtime.Historical Med      mirabegron ER (Myrbetriq) 50 MG 24 hr tablet Take 1 tablet (50 mg) by mouth daily.Disp-100 tablet, R-3, Normal      multivitamin with minerals tablet Take 1 tablet by mouth daily.Historical Med      naltrexone 50 MG tablet TAKE 1 TABLET BY MOUTH DAILY WITH FOODDisp-30 tablet, R-11, Normal      Omega-3 Fatty Acids (OMEGA 3 OR) Take 1 tablet by mouth every morning.Historical Med      pregabalin 300 MG capsule TAKE 1 CAPSULE BY MOUTH TWICE DAILYDisp-180 capsule, R-0, Normal      primidone 50 MG tablet Take 1 tablet (50 mg) by mouth 2 times a day.Disp-180 tablet, R-4, Normal      tiZANidine 4 MG tablet Take 1 tablet (4 mg) by mouth at bedtime.Disp-30 tablet, R-0, Normal             BRIEF ADMISSION HISTORY:   Ronnie Mason is a 72 year old male with HTN, significant alcohol use, chronic pain on multiple sedating medications, presenting with syncope in the setting of drinking and taking tizanidine     HOSPITAL COURSE:     Patient states she was generally feeling in his normal state of health earlier in the day.  He said he had several beers and some vodka throughout the day and had taken his morning  tizanidine when he found another tizanidine on the floor.  Thinking he Ronnie have forgotten his morning dose, he subsequently took this tizanidine.  He was started on tizanidine only a few days prior to this.  Later in the evening, he was seated at  dinner when he syncopized.  He has no recollection of the event.  He reportedly did not have any choking or seizure-like activity.  When paramedics arrived on the scene, the patient was quite hypotensive 78/60 with heart rate 48, another blood pressure 60/40.  EKG with sinus rhythm without evidence of ischemia.  No CPR was performed.  He received a liter of fluid while in the ambulance.     In the emergency department, initial vital signs notable for blood pressure as low as 72/45, afebrile, heart rate 63, respiratory rate 18, saturating 94% on room air. Lab workup is notable for macrocytosis, normal INR, normal troponin, normal BNP, normal lipase, BAC 161, CMP with creatinie 1.48, urine drug screen positive for barbiturates, lactate initially 3 down to 1.9 after fluids.  CTA head and neck did not demonstrate acute infarct/hemorrhage but did demonstrate chronic small vessel disease and volume loss.  CT chest abdomen pelvis without acute findings.  CT C-spine and x-ray of hand were negative for acute fracture.     He was given 2 additional liters of fluid and ceftriaxone prior to admission to medicine.     When I saw the patient, he was asymptomatic.  He specifically denies chest pain, shortness of breath, confusion, worsening pain, fevers, chills, nausea/vomiting, or other symptoms.      #Presumed tizanidine overdose  #Polypharmacy  #Elevated lactate (resolved)  #Hypotension  #Syncope  #Chronic back pain  #positive barbituate (primidone)  Patient presenting with syncope in the setting of drinking, taking multiple doses of tizanidine while on several other sedating medications.  Presenting with severe hypotension that has improved with fluids.  Lower suspicion for  arrhythmia, structural cardiac etiology of syncope, or TIA/stroke.  Patient is rather unbothered by this episode and wants to discharge home from the ED but was able to be convinced to stay. The following morning he felt his chronic cervical neck pain was exacerbated but likely from the fall he experienced. His blood pressure normalized and if anything become more hypertensive while off his medication. He declined any further testing including echo nor physical therapy. He felt comfortable being able to pursue any further work up with his PCP. His main focus was to be able to return to enjoy time with his wife during this holiday week. He felt comfortable that his event was a result of overmedication along with his concurrent alcohol use. Resources provided and he felt comfortable that if he should experience any worsening symptoms he knew where to seek immediate medical attention but will also follow up with PCP. Agreed with patient that he should discontinue his tiaznidine and follow up with his prescribing pain provider.      #AKI  Presumably prerenal in the setting of dehydration, alcohol use  Normalized the following morning.     #Alcohol use disorder  Drinks 3 to 5 glasses of wine or the equivalent roughly 4 nights a week which does meet criteria for alcohol use disorder.  Denies any history of alcohol withdrawal. Was given empiric IV thiamine on admission. Recommend he continue naltrexone at      #Macrocytosis  Presumably related to alcohol use. Recommended alcohol cessation as above     #HTN: held home amlodipine 5 and lisinopril 20 but can resume on discharge  #Depression: Duloxetine 60  #HLD: Atorvastatin 10  #Overactive bladder: Mirabegron  #Essential tremor: Continued home primidone 50 twice daily (although would be reasonable to discontinue if possible)     #CAD  Moderate coronary artery calcifications  noted on admission CTA chest  -Could consider formal coronary calcium or stress testing in the  outpatient setting but patient will follow this up with PCP       DISPOSITION:    01 HOME/SELF CARE [01]    CONDITION: fair     CONSULTS COMPLETED:    ED GENERAL CONSULT     OPERATIONS/PROCEDURES:  No surgeries this admission    Additional procedures:  None     PRINCIPAL DIAGNOSTIC STUDIES/RESULTS:    Imaging Results:  XR Hand 3+ View Left  Narrative: RADIOGRAPHIC EXAMINATION:   XR HAND 3+ VW LEFT    CLINICAL INDICATION:  left hand injury    COMPARISON:  None.    FINDINGS AND   Impression: No fracture detected.  Alignment is normal.   CTA Chest Abdomen Pelvis Angio  Narrative: EXAMINATION:   CTA CHEST ABDOMEN PELVIS ANGIO:  CTA OF THE ABDOMINAL AORTA    CLINICAL INDICATION:  Syncope, eval for dissection, aneurysm, PE, other acute    TECHNIQUE:   CVA 04 CTA Abdomen and Pelvis  CTA through the abdomen and pelvis following intravenous contrast. Volume rendered, MRP and/or MIP reconstructions were created on an independent workstation to better visualize and evaluate the abdominal aorta and branch vessels.    CVA 12 CTA Thoracic Aorta.    CTA of thoracic aorta was performed through the chest with intravenous contrast.  Axial, sagittal and coronal reconstructions were performed.  Volume rendered, MRP and/or MIP reconstructions were created on an independent workstation to better visualize and evaluate the thoracic aorta and  branch vessels.    CONTRAST:  IOHEXOL 350 MG/ML IV SOLN,100 mL Intravenous,01/31/2023 2156    COMPARISON:  None.    FINDINGS:  Aorta:  No aortic dissection or aneurysm. The arch vessel origins are patent. The celiac trunk, SMA, IMA and renal arteries are patent. Minimal atherosclerotic calcifications.    No evidence of acute pulmonary embolism.    Thyroid: Incidental small coarsely calcified left thyroid nodule.  Thoracic lymph nodes: No enlarged supraclavicular, mediastinal, hilar or axillary lymph nodes.  Mediastinum and esophagus: Unremarkable.  Heart: Moderate coronary arterial  calcifications  Lungs: Subsegmental atelectasis and scarring in the left lung base accompanied by elevation of the left hemidiaphragm. Mild dependent hypoventilatory changes and scarring in the right base.   Pleura: No pneumothorax or pleural effusion. Small calcification along the left posterior costophrenic angle.  Chest wall: Unremarkable.    Liver: Appears fatty.      Gallbladder: Unremarkable.  Bile ducts: Unremarkable.    Spleen: Unremarkable.    Pancreas: Unremarkable.  Adrenal glands: Unremarkable.  Kidneys and ureters: Unremarkable.  Lymph Nodes: Mildly prominent porta hepatis and portacaval nodes, likely reactive  Other vasculature: Unremarkable.  Bowel: Few scattered colonic diverticula without evidence of acute diverticulitis. No evidence of mechanical bowel obstruction.  Peritoneum: Unremarkable.  Abdominal wall: Tiny fat-containing inguinal hernias  Bladder: Unremarkable.  Reproductive organs: Unremarkable.    Bones: No acute findings. Degenerative changes  Impression: No aortic dissection or any erosion.    No evidence of acute pulmonary embolism.    No acute intrathoracic, intra-abdominal or intrapelvic abnormality.    Chronic findings as above.  CT C Spine wo Contrast  Narrative: EXAMINATION:   CT C SPINE W/O CONTRAST    CLINICAL INDICATION:  Syncope    TECHNIQUE:  CT Cervical Spine without contrast: Retro (N-[id number])    Contiguous axial sections of the cervical spine were retrospectively reconstructed from the occiput through [T3].  Sagittal and  coronal reformatted images were performed.     Inferior Extent:  T4    COMPARISON:  September 16, 2014    FINDINGS:  No acute cervical spinal fracture. Mild degenerative anterolisthesis at C4/5. Extensive multilevel degenerative changes which have progressed since February 2016. The bones appear osteopenic.  Impression: No acute cervical spinal fracture. Interval progression of degenerative changes since February 2016.  CTA Head and Neck Angio  Narrative:  EXAMINATION:   CTA HEAD AND NECK ANGIO    CLINICAL INDICATION:  Syncope, eval for ICH, dissection, other acute    TECHNIQUE:  CTA Head and Neck Stroke (N-48)    1. Non-contrast head: Contiguous axial  sections were obtained through the head without contrast.    2. CTA Head/Neck: Contiguous axial  sections were obtained  from the aortic arch to the vertex during bolus infusion of contrast.   Slab MIP reformatted images of extracranial and intracranial vessels were obtainedmultiple planes.   3. Post-contrast head:  Contiguous axial  sections were obtained through the head after contrast injection.   Patient age specific scan parameters were used for radiation exposure.    CONTRAST:    IOHEXOL 350 MG/ML IV SOLN,100 mL Intravenous,01/31/2023 2156    COMPARISON:  September 16, 2014    FINDINGS:    NON-CONTRAST HEAD CT:   MASS EFFECT & VENTRICLES: No shift. The lateral ventricles are symmetric. No hydrocephalus. Ventricular and sulcal prominence related to volume loss which has progressed since February 2016.  BRAIN:  Volume loss and chronic small vessel ischemic changes have progressed since February 2016. No acute infarct, hemorrhage or mass lesion is seen.    VASCULAR:  Atherosclerotic calcifications.  EXTRA-AXIAL:  Extra-axial spaces are normal.   EXTRA-CRANIAL:   Skull and facial bones are normal. Sinuses and mastoids are clear. Orbits are normal.     POST-CONTRAST HEAD CT:   ENHANCEMENT: Normal intracranial enhancement.    HEAD CTA:  ARTERIES:  Normal intracranial arteries. No aneurysm, AVM, stenosis or occlusion.   VARIANTS: Circle of Anne Hahn is normally developed. No other vascular anatomic variants.  VEINS:  Dural sinuses and deep venous structures are normal.      NECK CTA:  AORTIC ARCH:  Aortic arch and great vessel origins are normal.  CAROTIDS: Atherosclerotic calcification involving the right carotid bulb. No hemodynamically significant carotid stenoses bilaterally by NASCET criteria.  VERTEBRALS:  Poor  visualization of the right V1 segment due to artifacts. The right V2 and V3 segments are unremarkable. The left vertebral artery is unremarkable.   Impression: No acute infarct, hemorrhage or mass effect. Chronic small vessel disease and volume loss have progressed since February 2016.    No intracranial large vessel occlusion.    No hemodynamically significant carotid stenoses in the neck. Unremarkable left vertebral artery. Poor visualization of the proximal right vertebral artery due to artifacts, otherwise unremarkable right vertebral artery.    Internal carotid artery origin stenosis by NASCET criteria:  No significant stenosis by NASCET criteria.        Discharge Orders   Regular     Diet type: Regular        DISCHARGE PHYSICAL EXAM:   Vitals (Most recent in last 24 hrs)     T: 37 C (02/01/23 0944)  BP: (!) 154/95 (02/01/23 0944)  HR: 78 (02/01/23 0126)  RR: 18 (02/01/23 0944)  SpO2: 94 % (02/01/23 0944) Room air  T range: Temp  Min: 36.3 C  Max: 37 C  Admit weight: (!) 107.2 kg (  236 lb 5.3 oz) (01/31/23 2100)  Last weight: (!) 105.1 kg (231 lb 11.3 oz) (02/01/23 0505)       Physical Exam  General:  Comfortable appearing, breathing easily, no acute distress.  Eyes:  Anicteric, no conjunctival injection.  ENT:  Oropharynx clear without lesions or exudates.  Chest:  Equal breath sounds bilaterally, no wheezes or crackles.  CV:  Regular rate and rhythm, no murmurs.  Abdomen:  Soft, non distended, no masses, non tender.  Extremities:  No erythema of large or small joints;  no lower extremity edema.  DP pulses palpable bilaterally.  Neuro:  Awake/alert, follows commands, answers questions appropriately.  Fluent speech.  CNII-XII intact.  Moving all 4 extremities without difficulty.  Psych:  Calm and cooperative.  Skin:  Left forehead irritation likely from syncopal event and fall      ATTENDING TIME STATEMENT:   I spent more than 30 minutes on hospital discharge day management.    [facility] Medicine physicians  mentioned in this note can be reached by calling the [facility] Medicine Paging Operator at [phone]. If any part of this transcript is missing or to request other transcripts for this patient call [phone]. For online access to patient records enroll in [facility] [employer] at [email or web address].

## 2023-02-01 NOTE — Progress Notes (Signed)
Telemetry Progress Note    Day Shift Cardiac Summary      Active Cardiac Monitor: Yes    Cardiac Rhythm: SR    Current Heart Rate: 71    Rate Range: 60 - 100    Events:     Additional Information:

## 2023-02-01 NOTE — Progress Notes (Signed)
Discharge Planning Assessment    Visit Information:    Patient is a 40 yrs male admitted on 02/01/2023 for Syncope and collapse.   Discharge Planning Assessment Type:    Is an interpreter used::    Is this a planned admission:  ,    Did pt/family decline dc planning evaluation/assistance?      MSW reviewed information in pt's chart and attended Multidisciplinary rounds. MSW met with pt at bedside introduced self and role of MSW and completed assessment. Pt verified information in chart to be accurate. Pt reports he lives at home with wife. Pt was independent prior hospitalization. Per MD, Pt is discharging home today. MSW offered Alcohol resource, Pt decline to get the resources. Pt's wife will be providing transportation at the DC.     Pt is discahrging home with wife at 1:00pm today.    Living Situation Prior to Admit:  Living Situation Prior to Admission: Home with family/support     Pt reports that they felt their needs were met in living situation:    Home Accessibility Concerns:    Primary Caregiver:    Support System:    Physical/Cognitive/Functional History: No Limitations  Access to Healthcare Prior to Admit:    Activities of Daily Living Prior to Admission:  PTA-Independent with ADLs?: Yes  PTA-Memory Adequate to Safely Complete Daily Activities: Yes  PTA- Adequate self-medication management: Yes  PTA-Patient Able to Express Needs/Desires: Yes  PTA-Dressing: Independent  PTA-Feeding: Independent  PTA-Bathing: Independent  PTA-Toileting: Independent  PTA-In/Out Bed: Independent  PTA-Walks in Home: Independent  PTA-Assistive Devices: None    Verifications:   Home Address Verified with Patient/LNOKPhill Myron [address]  [place] [address]   Coverage Verified with Patient/LNOK: Primary verified   Insurance Information                  [place] MED[employer] MEDICARE ADVANTAGE [place] Phone: [phone]    Subscriber: Abubaker, Errington Subscriber#: [id number]    Group#: [id number] Precert#: --             Financial Concerns/Information: No financial issues  PCP Verified with Patient/LNOK: Kathrine Haddock, MD   Preferred Pharmacy Verified with Patient/LNOKElizebeth Koller [phone] [address] [place] [phone]      Emergency Contact Verified with Patient/LNOK: Yes   Extended Emergency Contact Information  Primary Emergency Contact: Maradiaga,Linda "[name]"  Address: [address]           [place], [place] [address] [place]  Home Phone: [phone]  Mobile Phone: [phone]  Relation: Spouse  Secondary Emergency Contact: [facility] [place]  Home Phone: [phone]  Mobile Phone: [phone]  Relation: Daughter    Health Care Decision Making:  Code status: Full Code  POLST: <no information>  Does the patient have a Healthcare DPOA? No   If so, is the paperwork scanned into the chart?  No    Discharge Plan:  EDD:02/01/2023  Patient Discharge Goals were/are: Discussed wi/ patient  Patient expects to be discharged to: Home with wife  Post-discharge facility and service information was:    Anticipated living situation post discharge:    Anticipated Assistance Needed at discharge: Wife will be here  Anticipated Resources/Services Needed at discharge:    Post Discharge Caregiver Confirmation: Not applicable          Discharge Transportation:  Does the patient need dc transportation arranged? No  Transportation Type: Private vehicle    Potential Barriers to Discharge:    Discharge Complexity Level:   Initial Discharge Planning Assessment Complete: Yes      Paralee Cancel, MSW

## 2023-02-01 NOTE — Nursing Note (Addendum)
Illness Severity  Watch-ED Admit      Patient Summary  72 y.o. male ED Admitted at 0130am for experiencing a syncopy episode at home. Pt was brought in by EMS. According to his wife, pt had been drinking beer and vodka, and took Tizanidine. He had eaten pizza, then fell asleep on the   couch, rolled off the couch and fell to the floor. He was unresponsive, so she called EMS.    PMH: ETOH, TBI, CVA, HTN, HLD, hypotension, Chronic pain, Spondylosis of cervical region, Demyelinating changes of brain, MRSA (2020), OA of R Hip, bee sting allergy, BPH, Idiopathic peripheral neuropathy, essential tremor, mallet   toe R foot, Renal insufficiency Stage 3, Impaired mobility & ADLs (2017), enuresis....    Pt is A&Ox4, anxious and impulsive at times. SBP & DBP elevated 145/91, HR 78, Afebrile, satting 98% on RA. Remote Tele NSR w/ HR in 70s. Pt's gait, while steady, he appeared to shuffle his feet. CIWA scale ordered. 2am was 4. Pt   denied pain. Pt denied SOB. Pt denied nausea. Pt voiding. Abdomen distended and rounded, but soft. Hypoactive B.T.s. No edema BLE. Good pedal pulses. Skin warm and dry. An abrasion on L forehead. Pt denied headache. Pt denied any chest pain. Pt had   equal strength w/ grips on both hands. Full sensation. Bed alarm set. Pt educated to call for staff assistance, to not get out of bed on his own. Fall mat in place.      Action Items  -PIVs: 18g R AC & 20g R FA   -High fall risk  -CIWA scale        Situational Awareness  From home. Lives w/ wife.

## 2023-02-01 NOTE — Nursing Note (Signed)
Patient discharged today at 12:30 , picked up by wife, IV discontinued, Discharge instructions given and questions answered.  Belongings gathered and returned.  No acute events before discharge.

## 2023-02-02 ENCOUNTER — Encounter (HOSPITAL_BASED_OUTPATIENT_CLINIC_OR_DEPARTMENT_OTHER): Payer: Self-pay

## 2023-02-02 ENCOUNTER — Telehealth (INDEPENDENT_AMBULATORY_CARE_PROVIDER_SITE_OTHER): Payer: Self-pay | Admitting: Acupuncturist

## 2023-02-02 LAB — BLOOD C/S

## 2023-02-02 NOTE — Telephone Encounter (Signed)
Sent Mychart message to pt.

## 2023-02-03 ENCOUNTER — Ambulatory Visit (HOSPITAL_BASED_OUTPATIENT_CLINIC_OR_DEPARTMENT_OTHER): Payer: Medicare PPO

## 2023-02-04 LAB — BLOOD C/S: Culture: NO GROWTH

## 2023-02-11 ENCOUNTER — Telehealth (INDEPENDENT_AMBULATORY_CARE_PROVIDER_SITE_OTHER): Payer: Self-pay | Admitting: Acupuncturist

## 2023-02-11 NOTE — Telephone Encounter (Signed)
Sent Mychart message to pt.

## 2023-02-12 ENCOUNTER — Ambulatory Visit (INDEPENDENT_AMBULATORY_CARE_PROVIDER_SITE_OTHER): Payer: Medicare PPO | Admitting: Acupuncturist

## 2023-02-12 DIAGNOSIS — G8929 Other chronic pain: Secondary | ICD-10-CM

## 2023-02-12 DIAGNOSIS — M5459 Other low back pain: Secondary | ICD-10-CM

## 2023-02-12 DIAGNOSIS — M545 Low back pain, unspecified: Secondary | ICD-10-CM

## 2023-02-12 NOTE — Progress Notes (Signed)
[facility]    Acupuncture Visit (#9 in 2024)      Referred by: Drexel Iha, MD     PCP: [name], MD    HPI: Ronnie Mason was referred to acupuncture and [place] medicine for consultation and treatment of     Chief Complaint   Patient presents with    Acupuncture     Chronic Lower back pain     CC1: Chronic Lower back pain  - Location: Bilateral lower back,  - During the past week, lower back has been feeling better, and not experiencing much pain.  - Noted no change in medication or pattern of diet   - Using tends unit here and there but not everyday.   - Still takes 3000mg  of Tylenol daily for the pain aside from other prescriptions.     Other MSK noted  Neck and shoulder is also feeling better. No radiating pain    From recent medical doctor visit: His doctor indicated that his MRI indicates that lower back has deteriorited a lot.     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    Idiopathic peripheral neuropathy 03/15/2015    Spondylosis of cervical region without myelopathy or radiculopathy 05/28/2014    Back pain, lumbosacral 02/15/2013    B12 deficiency 12/21/2012    Borderline low 5 /21/2014    Chronic renal insufficiency, stage III (moderate) (HCC) 12/21/2012    5/ 2013    Hyperlipidemia 09/26/2012    Cervicalgia 03/11/2010    Radiofrequency ablation of c3 to c 7    Allergic rhinitis due to allergen     Carotid Sinus Hypersensitivity     Chronic pain     chronic LUE pain had injection.    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 Hilarie Fredrickson, DPM    PR HALLUX RIGIDUS W/CHEILECTOMY 1ST MP JT W/O IMPLT Right 10/21/2017    Dr. Donn Pierini    PR UNLISTED PROCEDURE FEMUR/KNEE      PR UNLISTED PROCEDURE HANDS/FINGERS      PR UNLISTED PROCEDURE SPINE  2013    rfa to c3 to c 7     SURGICAL HX OTHER Right 12/03/2020    RIGHT foot WOUND INCISION AND DRAINAGE    TOE SURGERY Right 02/09/2019    toe surgery  Right 01/12/2019    Dr Hilarie Fredrickson  Medications:  Outpatient Medications Prior to Visit   Medication Sig Dispense Refill    Acetaminophen 500 MG Oral Tab Take 3 tablets (1,500 mg) by mouth 2 times a day.      albuterol HFA (ProAir HFA) 108 (90 Base) MCG/ACT inhaler Inhale 2 puffs by mouth every 4 hours as needed for shortness of breath/wheezing for up to 10 days. Dispense with spacer 18 g 0    Alpha-Lipoic Acid 300 MG tablet Take 300 mg by mouth 2 times a day.      amLODIPine 5 MG tablet Take 1 tablet (5 mg) by mouth 2 times a day. 180 tablet 1    atorvastatin 10 MG tablet Take 1 tablet (10 mg) by mouth daily. 90 tablet 3    BENFOTIAMINE OR Take 250 mg by mouth every morning.      betamethasone dipropionate 0.05 % lotion APPLY TO SCALP AND EARS TOPICALLY 2 TIMES A DAY AS NEEDED FOR ITCHING. Notify MD of any signs of infection or skin thinning 60 mL 5    Cholecalciferol (VITAMIN D3) 2000 units Oral Cap Take 1 capsule (2,000 Units) by mouth daily. For low level 1 capsule 1    clindamycin 1 % gel Apply 1 Application  topically 2 times a day as needed. Apply to back.      cyanocobalamin (Vitamin B-12) 1000 MCG tablet Take 1 tablet (1,000 mcg) by mouth  daily.      diclofenac 1 % gel Apply 4 g topically 4 times a day. Apply to 2 gram four times daily to neck, trapezius muscle, and low back. Use a max of 32 grams a day. (Patient taking differently: Apply 2-4 g topically 4 times a day as needed (pain).) 300 g 4    DULoxetine 60 MG DR capsule Take 1 capsule (60 mg) by mouth 2 times a day. 180 capsule 2    EPINEPHrine 0.3 MG/0.3ML auto-injector Inject one pen (0.3mg /mL) into the muscle for severe allergic reaction. Call 911. Use second pen if symptoms continue 2 each 0    ibuprofen 200 MG tablet Take 2 tablets (400 mg) by mouth every 8 hours as needed for pain.      lidocaine 5 % patch Apply 1 patch onto the skin daily. Apply to painful area for up to 12 hours in a 24 hour period. (Patient taking differently: Apply 1 patch onto the skin daily as needed. Apply to painful area for up to 12 hours in a 24 hour period.) 30 patch 2    lisinopril 20 MG tablet Take 1 tablet (20 mg) by mouth 2 times a day. 180 tablet 3    MAGNESIUM OR Take 2 tablets by mouth at bedtime.      mirabegron ER (Myrbetriq) 50 MG 24 hr tablet Take 1 tablet (50 mg) by mouth daily. 100 tablet 3    multivitamin with minerals tablet Take 1 tablet by mouth daily.      naltrexone 50 MG tablet TAKE 1 TABLET BY MOUTH DAILY WITH FOOD 30 tablet 11    Omega-3 Fatty Acids (OMEGA 3 OR) Take 1 tablet by mouth every morning.      pregabalin 300 MG capsule TAKE 1 CAPSULE BY MOUTH TWICE DAILY 180 capsule 0    primidone 50 MG tablet Take 1 tablet (50 mg) by mouth 2 times a day. 180 tablet 4    tiZANidine 4 MG tablet Take 1 tablet (4 mg) by mouth at bedtime. 30 tablet 0     No  facility-administered medications prior to visit.        Allergies:  Review of patient's allergies indicates:  Allergies   Allergen Reactions    Adhesives Skin: Rash     Glue on lido patches    Bee Venom Skin: Hives, Skin: Itching and Swelling    Gabapentin Other     Flu like symptoms, diarrhea, body ache upset stomach    Methocarbamol Other      Patient's wife reports cognitive difficulties ("goofy")        Family/Social history:  family history includes Colon Cancer in his father; Heart (other) in his mother; Other Family Hx in an other family member; Suicide Attempt in his brother. There is no history of Cancer.       OBJECTIVE:  Vitals:  There were no vitals taken for this visit.  Pulse: Thin and deep  Tongue: Red tongue body with back coating in the middle jiao region    Palpation: Discoloration of skin at the lower mid back - Jeffrie noted he is using the tens unit around the area.     Assessment/Plan:    Following an evaluation including the history of present illness/S, medical history and examination, the patient's clinical presentation suggest probable:    No diagnosis found.    .     Diagnosis According to [place] [facility]:  Bone Bi, Qid and blood stagnation in UB, GB, and SI channels.     Treatment Principle/Goals:   Short-term: Decrease discomfort/pain, improve circulation and increase range of motion  Long-term: Minimize probability of future exacerbations.    Acupuncture Treatment:  Verbal consent was obtained from the patient before performing acupuncture and other modalities in this session.     Second set of needles:  Supine  Mid: YNSA D1, KD8  R: YNSA D2, - San Cha San  L: Chong Men, Gan Men     Needles in: 8  Needles out: 8    First set of needles: Seating position  L: Marquette Saa  R: LV3    Needles in:2  Needles out: 2    Bodily Work: Gentle Guasha on the shoulder, mid back and lower back with massage oil applied. I avoided any skin regions.  Pyonex press needle (0.56mm) were placed at Du14 bilateral KD8, yaoyan, UB23, Right  GB21, advised to keep them upto 5 days and take them off. However, if there is any irritability or discomfort felt, then remove them sooner. I also advised to discard the used one safely.     I suggested once again to follow-up with his PCP regarding long term usage of high dose acetaminophen, and liver function  assessment.     Post Treatment Note:   Efton tolerated the all the treatment well without any complications.      Time Spent:   - First set of treatment - Face to face time: 15 mins  - Second set of treatment - Face to face time: 8 mins  Total: Time spent for face-to-face for acupuncture: 23 minutes.    Augusto Gamble, AEMP, DACM, LAc  Licensed Acupuncturist, Paramedic for [employer]  Department of Family medicine  [facility] [place]

## 2023-02-16 ENCOUNTER — Telehealth (INDEPENDENT_AMBULATORY_CARE_PROVIDER_SITE_OTHER): Payer: Self-pay | Admitting: Internal Medicine

## 2023-02-16 DIAGNOSIS — M205X1 Other deformities of toe(s) (acquired), right foot: Secondary | ICD-10-CM

## 2023-02-16 DIAGNOSIS — M545 Low back pain, unspecified: Secondary | ICD-10-CM

## 2023-02-16 DIAGNOSIS — Z89429 Acquired absence of other toe(s), unspecified side: Secondary | ICD-10-CM

## 2023-02-16 NOTE — Telephone Encounter (Signed)
Patient walked in and dropped of disabled parking permit for provider completion. Requested call back when done so they could come pick it up from clinic.    Forms placed in BVC bin.

## 2023-02-18 ENCOUNTER — Encounter (HOSPITAL_BASED_OUTPATIENT_CLINIC_OR_DEPARTMENT_OTHER): Payer: Self-pay

## 2023-02-18 ENCOUNTER — Telehealth (INDEPENDENT_AMBULATORY_CARE_PROVIDER_SITE_OTHER): Payer: Self-pay | Admitting: Acupuncturist

## 2023-02-18 NOTE — Telephone Encounter (Signed)
Sent Mychart message to pt.

## 2023-02-18 NOTE — Telephone Encounter (Signed)
Not in office until next Monday and filled disabled parking Monday

## 2023-02-19 ENCOUNTER — Ambulatory Visit (INDEPENDENT_AMBULATORY_CARE_PROVIDER_SITE_OTHER): Payer: Medicare PPO | Admitting: Acupuncturist

## 2023-02-19 NOTE — Telephone Encounter (Signed)
Form placed in PCP basket for completion.     Routing to provider.

## 2023-02-22 NOTE — Addendum Note (Signed)
Addended by: [name] on: 02/22/2023 07:50 AM     Modules accepted: Orders

## 2023-02-22 NOTE — Telephone Encounter (Signed)
Located completed form.     LVM forms ready for pick up     Leaving open for pick up please scan into chart per PCP

## 2023-02-22 NOTE — Telephone Encounter (Signed)
Done , please make copy for chart

## 2023-02-23 ENCOUNTER — Encounter (HOSPITAL_BASED_OUTPATIENT_CLINIC_OR_DEPARTMENT_OTHER): Payer: Self-pay | Admitting: Medical

## 2023-02-23 NOTE — Telephone Encounter (Signed)
Pt walked in. Forms given.     Nothing Further Needed    Closing Encounter

## 2023-02-24 ENCOUNTER — Telehealth (INDEPENDENT_AMBULATORY_CARE_PROVIDER_SITE_OTHER): Payer: Self-pay | Admitting: Acupuncturist

## 2023-02-24 ENCOUNTER — Other Ambulatory Visit (INDEPENDENT_AMBULATORY_CARE_PROVIDER_SITE_OTHER): Payer: Self-pay | Admitting: Internal Medicine

## 2023-02-24 DIAGNOSIS — M542 Cervicalgia: Secondary | ICD-10-CM

## 2023-02-24 DIAGNOSIS — G894 Chronic pain syndrome: Secondary | ICD-10-CM

## 2023-02-24 DIAGNOSIS — M792 Neuralgia and neuritis, unspecified: Secondary | ICD-10-CM

## 2023-02-24 NOTE — Telephone Encounter (Signed)
Received eCare message from patient asking to speak to Jonni Sanger PA-C re neck pain issues.    Replied to patient informing him that Lorin Mercy is out of the clinic until 03/02/23.    Refer to Jonni Sanger PA-C asking for feedback if patient needs to schedule appt.

## 2023-02-24 NOTE — Telephone Encounter (Signed)
Dr. Fredric Mare is not available this week, so I need to cancel pt's Friday, 02/26/23 @ 11 am acupuncture appointment. I called pt's preferred phone number and spoke to pt. I gave this information to pt, and pt stated it's OK to cancel the appointment. I cancelled pt's appointment and placed him on our urgent waiting list.

## 2023-02-26 ENCOUNTER — Ambulatory Visit (INDEPENDENT_AMBULATORY_CARE_PROVIDER_SITE_OTHER): Payer: Medicare PPO | Admitting: Acupuncturist

## 2023-02-26 MED ORDER — DULOXETINE HCL 60 MG OR CPEP
60.0000 mg | DELAYED_RELEASE_CAPSULE | Freq: Two times a day (BID) | ORAL | 1 refills | Status: DC
Start: 2023-02-26 — End: 2023-03-10

## 2023-02-27 MED ORDER — PREGABALIN 300 MG OR CAPS
300.0000 mg | ORAL_CAPSULE | Freq: Two times a day (BID) | ORAL | 0 refills | Status: DC
Start: 2023-02-27 — End: 2023-03-10

## 2023-03-01 ENCOUNTER — Encounter (HOSPITAL_BASED_OUTPATIENT_CLINIC_OR_DEPARTMENT_OTHER): Payer: Self-pay

## 2023-03-02 ENCOUNTER — Encounter (HOSPITAL_BASED_OUTPATIENT_CLINIC_OR_DEPARTMENT_OTHER): Payer: Self-pay | Admitting: Anesthesiology

## 2023-03-03 NOTE — Telephone Encounter (Signed)
Received eCare message from spouse [name] stating [place] heatcher needs a referral for acupuncture and PT every 3 months.     Replied to patient and spouse via eCare with copy of PT referral and Acupuncture referral. Asked spouse if she want them faxed to where she is getting treatment, to provide name and fax number.

## 2023-03-04 MED ORDER — IOHEXOL 240 MG/ML IJ SOLN
1.0000 mL | Freq: Once | INTRAMUSCULAR | Status: AC
Start: 2023-03-05 — End: 2023-03-05
  Administered 2023-03-05: 1 mL via EPIDURAL

## 2023-03-04 MED ORDER — BETAMETHASONE SOD PHOS & ACET 6 (3-3) MG/ML IJ SUSP
12.0000 mg | Freq: Once | INTRAMUSCULAR | Status: AC
Start: 2023-03-05 — End: 2023-03-05
  Administered 2023-03-05: 12 mg

## 2023-03-04 NOTE — Progress Notes (Signed)
[facility] Follow Up Visit  03/08/2023    Ronnie Mason;    MRN: [id number];   DOB: July 01, 1951    Chief Complaint   Patient presents with    Pain    Neck Pain     History of Present Illness:  Ronnie Mason was last seen by myself on 01/21/2023 where we recommended:  "Ronnie Mason is a 71 year old male with   Progressive left neck pain with radiation to left lateral upper arm.  Evaluated by Dr. Welton Flakes on 09/01/22 and determined to be likely candidate for C4-6 ACDF. Responded well to CESI on 12/04/2022.  Low back pain 2/2 facet arthropathy with improvement after L4-5, L5-S1 RFA. Additional components of myofascial pain syndrome, central sensitization, and lumbosacral radiculopathy.  Right hip pain 2/2 severe OA.  Prior treatment failures including short duration of benefit for interlaminar lumbar epidural steroids and TFESI, MBB, memantine, Naltrexone.   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    Rehabilitation:   - Start daily stretches every morning.    Medication suggestions:   - Cyclobenzaprine worked well but was sedating. Switch to tizanidine 4 mg QHS. May refill if works for pain relief.  - Diclofenac did not help, so OK to discontinue.    Procedure suggestions:   - Return for caudal epidural steroid injection. Prior LESI was done at L4-L5, but with new MRI findings of crowding of the cauda equina nerve roots, it was discussed that a caudal ESI would be a safer approach.  - Cervical pain currently controlled. Can consider repeat CESI or CT-guided C4-5  transforaminal epidural steroid injection in the future.  - Can consider spinal cord stimulation for lower back and leg symptoms after cervical surgery (assuming CESI or TFESI not sufficient to delay surgery further).    Follow-up:   - For Procedure"    He is here today for a follow up visit to discuss his neck pain.      He reports that his pain relief in the lower back is significantly improved since his caudal ESI several days ago.     His neck pain started to return in similar fashion as in the past, approximately 1 month ago. Turning the head left and right and looking up are giving his neck pain the most issues. He denies numbness, tingling, and pain radiating down his arms from his neck.    Per Dr Milta Deiters note on 09/01/22 (neurosurgery), the patient is a candidate for cervical decompression and fusion (likely C4-6 ACDF) but would like to exhaust conservative treatment options first.    Of note, he was seen in the ER on July 1 after ingesting tizanidine with wine, causing him to be oversedated. Tizanidine was discontinued at that time.     Past pain treatments/diagnostic testing include (copied forward and updated from the [facility] note dated 01/21/2023):   Dr. [name]  03/23/2018 - LEFT L5 TFESI with 80-85% relief for about 3-4 weeks of just axial back pain.   09/15/2017 - Bilateral L5 TFESI with 50-75%  relief of mixture of low back and leg pain.   05/19/2017 - RIGHT L5 TFESI with 100% relief of Radicular symptoms for about 3 months  Dr. Amada Jupiter  Injections in our clinic have included (exluding MBB and trigger point injections):  08/12/2016 - L5-S1 ILESI  07/01/2016 - Ultrasound guided right trochanteric bursa injection   05/20/2016 - Bilateral shoulder injections  05/13/2016 - Right L3 L4 L5/S1 RFA  02/19/2016 -  Left L3, L4, L5/S1 RFA  12/27/2012 - L5-S1 IL ESI  08/31/2012 - Left L5 TFESI  03/30/2012 - Left L4/5, L5/S1 facet inj  02/10/2012 - Left C6 C7 MB RFA  07/03/2011 - Left L3, L4, L5/S1 RFA  05/12/2011  - Left C4, C5, C6 RFA     Labs/Imaging Reviewed:  MRI C Spine wo Contrast 09/03/21  AXIAL DISCS, DURAL COMPRESSION & FORAMINA:  C2-3:  Mild left facet and uncovertebral joint osteoarthritis resulting in mild left foraminal stenosis.   C3-4:  Mild diffuse disc bulge with effacement of anterior thecal sac without significant central stenosis. Bilateral facet and uncovertebral joint osteoarthritis resulting in moderate to severe right and severe left foraminal stenosis   C4-5:  Mild diffuse disc bulge and hypertrophy of ligamentum flavum resulting in mild to moderate central stenosis. Bilateral facet and uncovertebral joint osteoarthritis resulting in moderate bilateral foraminal narrowing.   C5-6:  Mild diffuse disc bulge. Mild effacement of anterior thecal sac without significant central stenosis. Bilateral facet and uncovertebral joint osteoarthritis resulting in moderate right and moderate to severe left foraminal stenosis.   C6-7:  Mild diffuse disc bulge. No significant central stenosis. Bilateral facet and uncovertebral joint osteoarthritis resulting in severe right and moderate to severe left foraminal stenosis.   C7-T1:  No central or foraminal stenosis.  Facets are normal.      IMPRESSION  1. Multilevel lumbar spondylosis as detailed above more prominence at C4-C5 where there is mild to moderate central and moderate bilateral foraminal narrowing.  2. Multilevel moderate to severe neural foraminal stenosis at other levels as detailed above.    MRI L Spine wo Contrast 09/02/22  IMPRESSION  Compared to the MRI of 07/11/2019:     1.  Progression of multilevel degenerative change resulting in moderate L3-L4 spinal canal stenosis with crowding of the cauda equina nerve roots.  2.  Moderate left L3-L4 and right L5-S1 foraminal narrowing.MRI L Spine wo Contrast 09/02/22     XR L Spine 4+ View 08/25/22  FINDINGS AND IMPRESSION  Straightening with mild dextrocurvature.     Vertebral body heights are maintained.     Similar  appearance of moderate to advanced multilevel degenerative change most significant at L3-L4 through L5-S1 as evidenced by disc space narrowing, endplate spurring and facet degeneration.     Overlying soft tissues are grossly unremarkable.     EMG 08/25/22:  "IMPRESSION:     Abnormal study     There is electrodiagnostic evidence of left cervical motor radiculopathy in a C5/ C6 distribution. The findings were chronic in nature in the extremity. There was acute denervation seen only in the cervical paraspinals in the mid and lower region.   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   There is NO electrodiagnostic evidence of left ulnar neuropathy at the elbow. 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, N13.8] 07/17/2022    Urge incontinence [N39.41] 07/17/2022    Mallet toe, right [M20.5X1] 04/23/2022     Added automatically from request for surgery [id number]      Chronic bilateral low back pain without sciatica [M54.50, G89.29] 04/29/2021    Psychological factors affecting medical condition [F54] 04/24/2021    Macrocytosis without anaemia [D75.89] 02/12/2020    Myofascial pain [M79.18] 10/20/2019    MRSA (methicillin resistant staph aureus) culture positive [Z22.322] 07/13/2019    Closed fracture of one rib of right side [S22.31XA] 01/26/2019     See x ray 01/26/2019   Right anterior lat sub acute chronic       Enuresis [R32] 09/06/2018    Chronic left-sided low back pain without sciatica [M54.50,  G89.29] 06/21/2018    Acute left-sided low back pain without sciatica [M54.50] 03/14/2018    Greater trochanteric pain syndrome of right lower extremity [M25.551] 11/25/2017    Complex tear of medial meniscus of left knee as current injury [S83.232A] 09/08/2017    Primary osteoarthritis of left knee [M17.12] 09/08/2017    Right lumbar radiculitis [M54.16] 05/05/2017    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, 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] 04/13/2017    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] 01/11/2017     Osteoarthritis of right hip x ray report - moderate to advanced radiology read 01/11/2017      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79] 12/16/2015     [place] 4/29- 12/10/2015  Non-traumatic intraparenchymal hemorrhage, hypertensive of the left thalamus   Traumatic hemorrhage   -- falcine subdural hemorrhage, small cortical subarachnoid      Alcohol abuse [F10.10] 12/16/2015    Cerebral ventriculomegaly [G93.89] 10/09/2015    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    History of alcohol dependence (HCC) [F10.21] 06/26/2015    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) [G37.9] 05/07/2015    Idiopathic peripheral neuropathy [G60.9] 03/15/2015    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] 05/28/2014    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] 01/18/2014    Tremor [R25.1] 04/20/2013    Fall [W19.XXXA]  02/23/2013     BP < 100  Occasioanal tri[p and leg weakness      Back pain, lumbosacral [M54.50] 02/15/2013    Lumbar radiculopathy, chronic [M54.16] 02/15/2013    Neck pain [M54.2] 02/15/2013    Chronic pain [G89.29] 12/21/2012     Now at [place], DO, Santiago Glad    Neck pain -burn c3-7 on left side per patient  Some trigger injection    Lumbar pain-since  80's better with exercise      Bee sting allergy [Z91.030] 12/21/2012     hives      Numbness and tingling of leg [R20.0, R20.2] 12/21/2012     Left calf -left foot long term suspect from back-suspect L 45      Chronic back pain [M54.9, G89.29] 12/21/2012     Mri in mid scape 1/214  Multilevel degenerative disc disease of the lumbar spine. Circumferential disc bulge at all levels below and including L1-2. No significant central spinal canal stenosis or neuroforaminal narrowing.  2. Mild retrolisthesis of L5 on S1.            B12 deficiency [E53.8] 12/21/2012     Borderline low 5 /21/2014      Chronic renal insufficiency, stage III (moderate) (HCC) [N18.30] 12/21/2012     5/ 2013      Hyperlipidemia [E78.5] 09/26/2012    Cervicalgia [M54.2] 03/11/2010     Radiofrequency ablation of c3 to c 7          Pain Tracker Scores - reviewed.     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    Idiopathic peripheral neuropathy 03/15/2015    Spondylosis of cervical region without myelopathy or radiculopathy 05/28/2014    Back pain, lumbosacral 02/15/2013    B12 deficiency 12/21/2012    Borderline low 5 /21/2014    Chronic renal insufficiency, stage III (moderate)  (HCC) 12/21/2012    5/ 2013    Hyperlipidemia 09/26/2012    Cervicalgia 03/11/2010    Radiofrequency ablation of c3 to c 7    Allergic rhinitis due to allergen     Carotid Sinus Hypersensitivity     Chronic pain     chronic LUE pain had injection.    Disc disorder of lumbosacral region     Hip injury     HYPERTENSION      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[facility])      Past Surgical History:   Procedure Laterality Date    L meniciscus Left 07/2017    by Dr Marcelle Overlie , re did left menisicus     PR ANES; COLONOSCOPY  2002    repeat in 3 years    PR ANES; COLONOSCOPY & POLYPECTOMY  11/18/2007    repeat in 3 years    PR CORRECTION HAMMERTOE Right 05/13/2022    Dr. Hilarie Fredrickson, DPM    PR HALLUX RIGIDUS W/CHEILECTOMY 1ST MP JT W/O IMPLT Right 10/21/2017    Dr. Donn Pierini    PR UNLISTED PROCEDURE FEMUR/KNEE      PR UNLISTED PROCEDURE HANDS/FINGERS      PR UNLISTED PROCEDURE SPINE  2013    rfa to c3 to c 7     SURGICAL HX OTHER Right 12/03/2020    RIGHT foot WOUND INCISION AND DRAINAGE    TOE SURGERY Right 02/09/2019    toe surgery  Right 01/12/2019    Dr Hilarie Fredrickson      Family History       Problem (# of Occurrences) Relation (Name,Age of Onset)  Heart (other) (1) Mother: MI    Other Family Hx (1) Other: myelodysplasia    Colon Cancer (1) Father    Suicide Attempt (1) Brother: died of suicide           Negative family history of: Cancer          Social History     Tobacco Use    Smoking status: Never    Smokeless tobacco: Never    Tobacco comments:     Sometimes chews, but is trying to quit. 11/28/21   Substance Use Topics    Alcohol use: Yes     Alcohol/week: 3.0 standard drinks of alcohol     Types: 3 Glasses of wine per week     Comment: goal to get down to 2 per day     Current Outpatient Medications   Medication Sig Dispense Refill    Acetaminophen 500 MG Oral Tab Take 3 tablets (1,500 mg) by mouth 2 times a day.      albuterol HFA (ProAir HFA) 108 (90 Base) MCG/ACT inhaler Inhale 2 puffs by mouth every 4 hours as needed for shortness  of breath/wheezing for up to 10 days. Dispense with spacer 18 g 0    Alpha-Lipoic Acid 300 MG tablet Take 300 mg by mouth 2 times a day.      amLODIPine 5 MG tablet Take 1 tablet (5 mg) by mouth 2 times a day. 180 tablet 1    atorvastatin 10 MG tablet Take 1 tablet (10 mg) by mouth daily. 90 tablet 3    BENFOTIAMINE OR Take 250 mg by mouth every morning.      betamethasone dipropionate 0.05 % lotion APPLY TO SCALP AND EARS TOPICALLY 2 TIMES A DAY AS NEEDED FOR ITCHING. Notify MD of any signs of infection or skin thinning 60 mL 5    Cholecalciferol (VITAMIN D3) 2000 units Oral Cap Take 1 capsule (2,000 Units) by mouth daily. For low level 1 capsule 1    clindamycin 1 % gel Apply 1 Application  topically 2 times a day as needed. Apply to back.      cyanocobalamin (Vitamin B-12) 1000 MCG tablet Take 1 tablet (1,000 mcg) by mouth daily.      diclofenac 1 % gel Apply 4 g topically 4 times a day. Apply to 2 gram four times daily to neck, trapezius muscle, and low back. Use a max of 32 grams a day. (Patient taking differently: Apply 2-4 g topically 4 times a day as needed (pain).) 300 g 4    DULoxetine 60 MG DR capsule Take 1 capsule (60 mg) by mouth 2 times a day. 180 capsule 1    EPINEPHrine 0.3 MG/0.3ML auto-injector Inject one pen (0.3mg /mL) into the muscle for severe allergic reaction. Call 911. Use second pen if symptoms continue 2 each 0    ibuprofen 200 MG tablet Take 2 tablets (400 mg) by mouth every 8 hours as needed for pain.      lidocaine 5 % patch Apply 1 patch onto the skin daily. Apply to painful area for up to 12 hours in a 24 hour period. (Patient taking differently: Apply 1 patch onto the skin daily as needed. Apply to painful area for up to 12 hours in a 24 hour period.) 30 patch 2    lisinopril 20 MG tablet Take 1 tablet (20 mg) by mouth 2 times a day. 180 tablet 3    MAGNESIUM OR Take 2 tablets by mouth at bedtime.  mirabegron ER (Myrbetriq) 50 MG 24 hr tablet Take 1 tablet (50 mg) by mouth daily.  100 tablet 3    multivitamin with minerals tablet Take 1 tablet by mouth daily.      naltrexone 50 MG tablet TAKE 1 TABLET BY MOUTH DAILY WITH FOOD 30 tablet 11    Omega-3 Fatty Acids (OMEGA 3 OR) Take 1 tablet by mouth every morning.      pregabalin 300 MG capsule TAKE 1 CAPSULE BY MOUTH TWICE DAILY 180 capsule 0    primidone 50 MG tablet Take 1 tablet (50 mg) by mouth 2 times a day. 180 tablet 4     No current facility-administered medications for this visit.      Review of patient's allergies indicates:  Allergies   Allergen Reactions    Adhesives Skin: Rash     Glue on lido patches    Bee Venom Skin: Hives, Skin: Itching and Swelling    Gabapentin Other     Flu like symptoms, diarrhea, body ache upset stomach    Methocarbamol Other     Patient's wife reports cognitive difficulties ("goofy")       The past medical, surgical, family, and social histories were reviewed and updated personally with Ronnie Mason.  Medications and allergies were also reviewed and confirmed personally.  He reports no other change in past medical history, past surgical history, family history, or social history since last visit except as noted above.      Review of Systems:  The Review of Systems was provided by Ronnie Mason and entered in the [place] note associated with this encounter.  I did personally reviewed and confirmed the ROS information with Ronnie Mason and have the following additional comments:   NONE.    Physical Examination:   General:  healthy-appearing  well-nourished  no acute distress  Mental Status:  Active  normal mood  normal affect  Alert and oriented x3  Head:  normal appearing  Cervical spine:   no pain with flexion  moderate pain with extension  moderate tenderness over the lower facet joints and paraspinous muscles to palpation  marked pain with extension and bilateral facet loading  negative Spurling's test   Gait:  normal gait  normal station  Neuro:  Cranial nerves 2-12 grossly intact  Motor function  intact  Sensation:  Intact to light tough over the C5-C8 dermatomes  decreased to light touch over the left L5 and S1 dermatomes  Skin:  No obvious rashes, lesions, or erythema seen     Assessment/Discussion  (M47.812) Cervical facet joint syndrome  (primary encounter diagnosis)  (M54.2) Cervicalgia  (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 Evaluated by Dr. Welton Flakes on 09/01/22 and determined to be likely candidate for C4-6 ACDF. Responded well to CESI on 12/04/2022 but due to lack of radiculitis, not indicated at this time.  Low back pain 2/2 facet arthropathy with improvement after L4-5, L5-S1 RFA and lumbosacral radiculopathy with improvement after recent caudal ESI. Additional components of myofascial pain syndrome, central sensitization.  Right hip pain 2/2 severe OA.  Prior treatment failures including short duration of benefit for interlaminar lumbar epidural steroids and TFESI, MBB, memantine, Naltrexone.   Left IT  band syndrome  Significant medical comorbidities significant for multiple pain syndromes (neck pain, shoulder pain previously, groin/hip pain, prior greater trochanteric pain syndrome, R foot fracture, R lateral malleolus fracture), social hx positive for alcohol use disorder recently relapsed and in treatment, hx of possible NPH, hx of spontaneous intraparenchymal intracran bleed (including thalamus), mild TBI, among others    Plan:      Diagnostic evaluation:   - None at this time.    Rehabilitation:   - Start daily stretches every morning.    Medication suggestions:   - Discontinue tizanidine 4 mg QHS due to oversedation on July 1.     Procedure suggestions:   - Return for bilateral C5-C6, C6-C7 facet  joint medial branch blocks.   - We discussed facet pain and the role of diagnostic medial branch blocks (MBBs) in its diagnosis using an anatomical model.  The facet joints are a common source of back and neck pain.  They are performed using fluoroscopic guidance, radiographic contrast, and a small volume of local anesthetic.  The block specifically and temporarily anesthetizes the targeted facet joints.  A pain diary is maintained afterwards to determine the effect of the procedure.   If substantial pain relief is noted, particularly if accompanied by improvement in range of motion and function on both initial diagnostic blocks and second confirmatory blocks,  radiofrequency ablation procedure may be indicated.   IF a radiofrequency ablation is pursued: we plan to employ a traditional thermal non-pulsed radiofrequency technique:  temperature of ? 80 degrees centigrade, lesion duration of ? 90 seconds, and more than one lesion at each nerve location.    Ronnie Mason has chronic, ongoing axial spine pain, the goal of a medial branch block is to determine if the facet joints are the primary source of his pain.  For Ronnie Mason, the procedure is indicated for the following reasons: 1) moderate to severe pain has been present for more than 3 months with functional impairment; 2) he did not respond to conservative treatment efforts including physical therapy and medications for longer than 3 months; 3) Ronnie Mason pain is characteristic of facet pain and is primarily axial in nature without clear radicular symptoms or other neurological components; and 4) his imaging does not reveal another source of pain.    - Return for next scheduled caudal epidural steroid injection. A different approach will be attempted due to pressure during injection.     Follow-up:   - For Procedure      Jonni Sanger, PA-C  Physician Assistant - Teaching Associate  [facility]  Anesthesiology &  Pain Medicine

## 2023-03-04 NOTE — Progress Notes (Signed)
PROCEDURE NOTE    CAUDAL EPIDURAL STEROID INJECTION    PRE-PROCEDURE DIAGNOSIS  (M54.17) Lumbosacral radiculopathy  (primary encounter diagnosis).    ATTENDING PHYSICIAN     [name], MD.    Ernestine Mcmurray / RESIDENT  Orpha Bur     NPO STATUS  No.    CONSCIOUS SEDATION     None.    IV LINE  None.    MONITORING  Pulse oximetry and non-invasive blood pressure.    TIME-OUT  Performed.     DESCRIPTION OF PROCEDURE  Position: Prone.  Imaging technique: Fluoroscopy.  Monitoring: Pulse oxymetry.  Time-out: Performed.  Preparation: The injection area was prepped and draped in a sterile fashion. Needle entry site was anesthestized using 1% lidocaine and 25 G 1.5 inch needle.  Imaging for needle placement: anterior-posterior fluoroscopic image allowed symmetric visualization of the sacrum; then the beam was tilted to lateral to visualize the inferior portion of the sacral hiatus  Needle used: 22 G, 3.5 inch spinal needle  Needle insertion: under lateral view until inserted into sacral hiatus.   Control of a correct needle tip positioning with a lateral and anterior-posterior image.  Injection of 0.5 ml of Omnipaque 240 with correct spread into the epidural space  Injection of 2 ml of betamethasone 6mg /mL with 2 ml of saline - smaller volume used do to pressure within the space  Uneventful removal of the needle.    COMPLICATIONS  NONE. As noted above, smaller volume used due to stenosis    POST-PROCEDURE  Ronnie Mason was transported to the recovery area in stable condition for observation.  On re-examination more than 15 minutes after the procedure:  Ronnie Mason had no evidence of motor block.  The pain score was  4/10 prior to the procedure and 3/10 after the procedure (NRS 0-10, with 0 = no pain, 10 = worst pain imaginable).    PLAN  The pain intensity will be monitored by 1 month pain diary.  Plan for injection series. Will review imaging again to determine if best to continue with caudal versus alternative epidural  technique given pressure with caudal epidural. Prior LESI was in 2021.     The procedure was observed by the resident.

## 2023-03-05 ENCOUNTER — Ambulatory Visit (INDEPENDENT_AMBULATORY_CARE_PROVIDER_SITE_OTHER): Payer: Medicare PPO | Admitting: Acupuncturist

## 2023-03-05 ENCOUNTER — Ambulatory Visit: Payer: Medicare PPO | Attending: Anesthesiology | Admitting: Anesthesiology

## 2023-03-05 VITALS — BP 139/81 | HR 82 | Temp 97.7°F | Resp 18

## 2023-03-05 DIAGNOSIS — M5417 Radiculopathy, lumbosacral region: Secondary | ICD-10-CM | POA: Insufficient documentation

## 2023-03-05 MED ORDER — BETAMETHASONE SOD PHOS & ACET 6 (3-3) MG/ML IJ SUSP
INTRAMUSCULAR | Status: AC
Start: 2023-03-05 — End: 2023-03-05
  Filled 2023-03-05: qty 5

## 2023-03-05 NOTE — Progress Notes (Signed)
Ronnie Mason identified by name and DOB.  He confirmed he is having a midline caudal epidural steroid injection.  He was admitted to procedure area with steady gait.  He has not fallen in the last 6 months.  Fall prevention interventions: Patient provided with non-skid stockings  He denies taking any blood thinners.  He denies having a bleeding disorder.  He has held his medications per provider instructions.  He has not been on antibiotics for the past two weeks.  He denies a history of fainting during medical procedures.   He has not been NPO.  He undressed self without nursing assistance.  Is He receiving a steroid injection today? Yes  If yes, has He received the COVID-19 vaccine (either brand) within the past 2 weeks? No  OR does He plan to receive the COVID-19 vaccine (either brand) within the next 2 weeks? No  I verbally reviewed written discharge instructions and pain diar with him.   Signed consent for series not older than 90 days is scanned in the patient's chart: No  Confirmed ride home with [name], at beside.     He has a pre-procedure pain score of 4/10 L lower back.

## 2023-03-05 NOTE — Progress Notes (Signed)
Pain score prior to procedure:  4/10  Pain score after procedure:  3.5/10    Ronnie Mason met discharge criteria:  A/OX4/baseline, VSS/returned to baseline. Mobility returned to baseline prior to discharge. Procedure site clean dry and intact.  Written and verbal discharge instructions, including emergency contact phone numbers, reviewed with [name]. Verbal understanding obtained from him.  [name] dressed self without nursing assistance. He was discharged in stable condition from procedure area with steady gait. to home with all belongings and with ride/responsible person.

## 2023-03-05 NOTE — Patient Instructions (Signed)
[facility]  Post-Procedure Discharge Instructions      After the procedure:  Immediate and complete pain relief is rare  Numbness and/or weakness in the area of your body supplied by the injected nerve; these symptoms should resolve but may last up to several hours  Some soreness and bruising at the injection site(s)    Activities:  If you have any weakness or numbness caused by the injection, DO NOT DRIVE or operate machinery and limit other activity until sensation returns to normal.  You may resume regular exercises/activities as tolerated.  If you received sedation, DO NOT DRIVE or operate machinery for 24 hours.    Medications:  If you stopped taking any blood thinning medications such as Coumadin or Plavix, you may resume these tomorrow unless specified differently by the prescribing physician.    Site care:  You may remove the band-aid after 6 hours.  You may shower today. No swimming, tub baths or hot tubs for 24 hours following your procedure.  For the first 48 hours, apply ice packs to the injection site for 15-20 minutes hourly as needed for comfort.  Wrap a light towel or cloth around ice packs and heating pad to protect the skin.  After 48 hours, use a warm heating pad to the injection site for 15-20 minutes hourly as needed for comfort.    If you received steroids today:  Steroid medications may cause facial flushing, occasional low-grade fevers, hiccups, insomnia, headaches, water retention, increased appetite, increased heart rate, and abdominal cramping or bloating. These side effects occur in only about 5 percent of patients and commonly disappear within one to three days after the injection.  If you are diabetic check your blood sugar more frequently than usual as you may develop an increase in blood sugar for the next 10-14 days. Contact your diabetes physician if this occurs.    Call us if you develop any of the following symptoms in the next 7 days:  Fever above 100 degrees  F     Any unusual increase in your level of pain  Swelling, bleeding, redness, or increased tenderness at the procedure or IV site  Headache not relieved by Tylenol (if you had an epidural steroid injection)  .    Contact us:  Anytime, 24 hours/day, 7 days/week at [phone], option 2, option 2.     [facility]  Patient Self-Administered Pain Diary  1 week        The procedure you just had was done in hopes of providing long term pain relief. Depending on the type of procedure you had, you will need to answer the questions below 1 week after your procedure.  Accurate completion and timely reporting of your pain diary(s) enables your Pain MD to review the results and make further treatment recommendations.    1 week after your procedure, write your answers to the 4 questions below and send your pain score diary to [place] either by:      1)  Sending a picture via e[employer]    2)  Faxing to [phone]  3)  Calling the pain score voicemail line at [phone].  Leave the spelling of your first and last name, U#, date of birth and date of procedure and the answers to all of the 4 questions below.             Use this scale to rate your pain  0 1 2 3 4 5 6 7  8  9 10  No pain                      worst pain      Ronnie Mason   Procedure Date: 03/05/23  Procedure Name: Midline sacral caudal ESI  Procedure Attending: [name]    1.  Pain score immediately before procedure: __________    2. 1 week after your procedure, do you feel better?  YES/NO    3.  If you answered YES to the above, by what percentage__________% (0-100%) has your pain been relieved.    4.  Pain Score now: _________ (at time of telephone call)

## 2023-03-08 ENCOUNTER — Encounter (HOSPITAL_BASED_OUTPATIENT_CLINIC_OR_DEPARTMENT_OTHER): Payer: Self-pay | Admitting: Medical

## 2023-03-08 ENCOUNTER — Ambulatory Visit: Payer: Medicare PPO | Admitting: Medical

## 2023-03-08 VITALS — BP 153/92 | HR 77 | Temp 98.1°F

## 2023-03-08 DIAGNOSIS — M542 Cervicalgia: Secondary | ICD-10-CM | POA: Insufficient documentation

## 2023-03-08 DIAGNOSIS — M4802 Spinal stenosis, cervical region: Secondary | ICD-10-CM | POA: Insufficient documentation

## 2023-03-08 DIAGNOSIS — M47812 Spondylosis without myelopathy or radiculopathy, cervical region: Secondary | ICD-10-CM | POA: Insufficient documentation

## 2023-03-08 DIAGNOSIS — M7918 Myalgia, other site: Secondary | ICD-10-CM | POA: Insufficient documentation

## 2023-03-08 NOTE — Patient Instructions (Addendum)
Plan:      Diagnostic evaluation:   - None at this time.    Rehabilitation:   - Start daily stretches every morning.    Medication suggestions:   - Discontinue tizanidine 4 mg QHS due to oversedation on July 1.     Procedure suggestions:   - Return for bilateral C5-C6, C6-C7 facet joint medial branch blocks.   - We discussed facet pain and the role of diagnostic medial branch blocks (MBBs) in its diagnosis using an anatomical model.  The facet joints are a common source of back and neck pain.  They are performed using fluoroscopic guidance, radiographic contrast, and a small volume of local anesthetic.  The block specifically and temporarily anesthetizes the targeted facet joints.  A pain diary is maintained afterwards to determine the effect of the procedure.   If substantial pain relief is noted, particularly if accompanied by improvement in range of motion and function on both initial diagnostic blocks and second confirmatory blocks,  radiofrequency ablation procedure may be indicated.   IF a radiofrequency ablation is pursued: we plan to employ a traditional thermal non-pulsed radiofrequency technique:  temperature of ? 80 degrees centigrade, lesion duration of ? 90 seconds, and more than one lesion at each nerve location.    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  For [name], the procedure is indicated for the following reasons: 1) moderate to severe pain has been present for more than 3 months with functional impairment; 2) he did not respond to conservative treatment efforts including physical therapy and medications for longer than 3 months; 3) [name] pain is characteristic of facet pain and is primarily axial in nature without clear radicular symptoms or other neurological components; and 4) his imaging does not reveal another source of pain.    - Return for next scheduled caudal  epidural steroid injection. A different approach will be attempted due to pressure during injection.     Follow-up:   - For Procedure

## 2023-03-08 NOTE — Progress Notes (Signed)
Review of Systems   Constitutional: Negative.    HENT: Negative.     Eyes: Negative.    Respiratory: Negative.     Cardiovascular: Negative.    Gastrointestinal: Negative.    Endocrine: Negative.    Genitourinary: Negative.    Musculoskeletal:  Positive for arthralgias, back pain, gait problem and myalgias.   Skin: Negative.    Allergic/Immunologic: Negative.    Neurological:         Negative   Hematological: Negative.    Psychiatric/Behavioral: Negative.

## 2023-03-10 ENCOUNTER — Encounter (INDEPENDENT_AMBULATORY_CARE_PROVIDER_SITE_OTHER): Payer: Self-pay | Admitting: Internal Medicine

## 2023-03-10 ENCOUNTER — Ambulatory Visit (INDEPENDENT_AMBULATORY_CARE_PROVIDER_SITE_OTHER): Payer: Medicare PPO | Admitting: Internal Medicine

## 2023-03-10 VITALS — BP 127/81 | HR 81 | Temp 96.9°F | Resp 16

## 2023-03-10 DIAGNOSIS — M542 Cervicalgia: Secondary | ICD-10-CM

## 2023-03-10 DIAGNOSIS — M792 Neuralgia and neuritis, unspecified: Secondary | ICD-10-CM

## 2023-03-10 DIAGNOSIS — E785 Hyperlipidemia, unspecified: Secondary | ICD-10-CM

## 2023-03-10 DIAGNOSIS — G250 Essential tremor: Secondary | ICD-10-CM

## 2023-03-10 DIAGNOSIS — G894 Chronic pain syndrome: Secondary | ICD-10-CM

## 2023-03-10 DIAGNOSIS — F1010 Alcohol abuse, uncomplicated: Secondary | ICD-10-CM

## 2023-03-10 DIAGNOSIS — I10 Essential (primary) hypertension: Secondary | ICD-10-CM

## 2023-03-10 DIAGNOSIS — R790 Abnormal level of blood mineral: Secondary | ICD-10-CM

## 2023-03-10 MED ORDER — PRIMIDONE 50 MG OR TABS: 50.0000 mg | ORAL_TABLET | Freq: Two times a day (BID) | ORAL | 4 refills | Status: DC

## 2023-03-10 MED ORDER — DULOXETINE HCL 60 MG OR CPEP
60.0000 mg | DELAYED_RELEASE_CAPSULE | Freq: Two times a day (BID) | ORAL | 1 refills | Status: DC
Start: 2023-03-10 — End: 2024-02-23

## 2023-03-10 MED ORDER — PREGABALIN 300 MG OR CAPS
300.0000 mg | ORAL_CAPSULE | Freq: Two times a day (BID) | ORAL | 0 refills | Status: DC
Start: 2023-03-10 — End: 2023-09-07

## 2023-03-10 NOTE — Progress Notes (Signed)
Ronnie Mason is a 72 year old male       BP 127/81   Pulse 81   Temp 36.1 C (Temporal)   Resp 16   SpO2 94%   Chief Complaint   Patient presents with    Follow-Up      Medications follow up, follow up on low back problem         HPI  History of:   pcp [name], MD       Patient Care Team:  [name], MD as PCP - General (Internal Medicine)  Geri Seminole, MD (Sports Medicine)  Clydene Pugh, MD (Gastroenterology)  Casandra Doffing, MD (General Surgery)  Reggie Pile, ARNP (Urology)  Hilarie Fredrickson Dong-Hyun, DPM (Podiatry)  Renea Ee, MD as Primary Contact (Pain Management)  Doreene Nest, MD as Surgeon (Otolaryngology)  Durwin Nora, MD as Primary Contact (Urology)      Today follow-up above issues:    Neck and back injections w pain clinic Dr Karn Cassis and Dr Shayne Alken - brach blocks and rfa ablation if well after   Otherwise well   Admits to being in er as syncopal episode as overused med and had alcohol   No longer doing this - no longer on muscle relaxant and has cut alcohol way down   Insurance wants to come out for [facility] Encounter on 01/31/23   1. CBC with Diff   Result Value Ref Range    WBC 6.49 4.3 - 10.0 10*3/uL    RBC 3.95 (L) 4.40 - 5.60 10*6/uL    Hemoglobin 13.4 13.0 - 18.0 g/dL    Hematocrit 41 45.4 - 50.0 %    MCV 104 (H) 81 - 98 fL    MCH 33.9 (H) 27.3 - 33.6 pg    MCHC 32.7 32.2 - 36.5 g/dL    Platelet Count 098 119 - 400 10*3/uL    RDW-CV 13.7 11.0 - 14.5 %    % Neutrophils 53 %    % Lymphocytes 31 %    % Monocytes 12 %    % Eosinophils 3 %    % Basophils 1 %    % Immature Granulocytes 0 %    Neutrophils 3.43 1.80 - 7.00 10*3/uL    Absolute Lymphocyte Count 2.03 1.00 - 4.80 10*3/uL    Monocytes 0.76 0.00 - 0.80 10*3/uL    Absolute Eosinophil Count 0.19 0.00 - 0.50 10*3/uL    Basophils 0.07 0.00 - 0.20 10*3/uL    Immature Granulocytes 0.01 0.00 - 0.05 10*3/uL    Nucleated RBC 0.00 0.00 10*3/uL    % Nucleated RBC 0 %   2. Prothrombin  Time   Result Value Ref Range    Prothrombin Time Patient 12.8 10.7 - 15.6 s    Prothrombin INR 0.9 0.8 - 1.3   3. Partial Thromboplastin Time   Result Value Ref Range    Partial Thromboplastin Time 28 22 - 35 s    Partial Thromboplastin X Mean       To calculate the PTT X Mean divide PTT value by 29.   4. Comprehensive Metabolic Panel   Result Value Ref Range    Sodium 140 135 - 145 meq/L    Potassium 4.6 3.6 - 5.2 meq/L    Chloride 104 98 - 108 meq/L    Carbon Dioxide, Total 26 22 - 32 meq/L    Anion Gap 10 4 -  12    Glucose 117 62 - 125 mg/dL    Urea Nitrogen 23 (H) 8 - 21 mg/dL    Creatinine 1.91 (H) 0.51 - 1.18 mg/dL    Protein (Total) 6.0 6.0 - 8.2 g/dL    Albumin 3.8 3.5 - 5.2 g/dL    Bilirubin (Total) 0.2 0.2 - 1.3 mg/dL    Calcium 8.6 (L) 8.9 - 10.2 mg/dL    AST (GOT) 33 9 - 38 U/L    Alkaline Phosphatase (Total) 64 36 - 161 U/L    ALT (GPT) 28 10 - 48 U/L    eGFR by CKD-EPI 2021 51 (L) >59 mL/min/1.73_m2   5. Troponin-I   Result Value Ref Range    Troponin_I <0.03 <0.04 ng/mL    Troponin_I Interpretation Normal    6. B-Type Natriuretic Peptide   Result Value Ref Range    B_Type Natriuretic Peptide 85 <101 pg/mL   7. L Lactate, Venous WB (To U/H Lab < 30 Min)   Result Value Ref Range    L Lactate (Direct), Venous Whole Blood 3.0 (H) 0.6 - 1.9 mmol/L   8. Lipase   Result Value Ref Range    Lipase 46 <70 U/L   9. Urinalysis with Reflex Culture    Specimen: Urine   Result Value Ref Range    Color, URN Yellow     Clarity, URN Clear     Specific Gravity, URN 1.040 (H) 1.006 - 1.027 g/mL    pH, URN 6.5 5.0 - 8.0    Protein (Alb Semiquant), URN Trace (A) NRN    Glucose Qual, URN Negative NRN    Ketones, URN Negative NRN    Bilirubin (Qual), URN Negative NRN    Occult Blood, URN Negative NRN    Nitrite, URN Negative NRN    Leukocyte Esterase, URN Negative NRN    Urobilinogen, URN 0.1-1.9 URONML [Ehrlich'U]    Comments for Macroscopic, URN None     Collection Method Urine     WBC, URN 0-5(NEG) Z5NEG /[HPF]    RBC,  URN 3-5 (1+) (A) Z2NEG /[HPF]    Bacteria, URN Not Seen NOSEEN    Epith Cells_Squamous, URN 0-5(NEG) LT6 /[LPF]    Epith Cells_Renal/Trans,URN <3(NEG) YNWG9 /[HPF]    Comments For Microscopic, URN None NONE    Mucus, URN Present (A) NOSEEN /[LPF]    Cast_Hyaline, URN 6-10(2+) (A) NOSEEN /[LPF]    1st Extra Urine [employer] Additional collection tube    10. Alcohol, Ethyl, Blood   Result Value Ref Range    Alcohol (Ethyl) 161 (A) NRN mg/dL   11. Drug Screen, Standard, Urine   Result Value Ref Range    Amphet/Methamphetamine Qual,URN Negative NRN    Barbiturate (Qual), URN Positive (A) NRN    Benzodiazepines (Qual), URN Negative NRN    Cocaine (Qual), URN Negative NRN    Alcohol (Ethyl), URN 120 (A) NRN mg/dL    Methadone (Qual), URN Negative NRN    Opiates (Qual), URN Negative NRN    Phencyclidine (Qual), URN Negative NRN    Cannabinoids (Qual), URN Negative NRN    Tricyclic Antidepressants, URN Negative NRN    Acetaminophen Qualitative, URN Positive (A) NRN    Fentanyl Qual, URN Negative NRN    Drug Screen Test Info, URN SEE NOTES    12. 1st Extra Gold Top   Result Value Ref Range    1st Extra Gold Top Additional collection tube    13. L Lactate, Venous WB (To  U/H Lab < 30 Min)   Result Value Ref Range    L Lactate (Direct), Venous Whole Blood 1.9 0.6 - 1.9 mmol/L   14. Basic Metabolic Panel   Result Value Ref Range    Sodium 140 135 - 145 meq/L    Potassium 4.2 3.6 - 5.2 meq/L    Chloride 101 98 - 108 meq/L    Carbon Dioxide, Total 30 22 - 32 meq/L    Anion Gap 9 4 - 12    Glucose 81 62 - 125 mg/dL    Urea Nitrogen 18 8 - 21 mg/dL    Creatinine 1.61 0.96 - 1.18 mg/dL    Calcium 9.5 8.9 - 04.5 mg/dL    eGFR by CKD-EPI 4098 >60 >59 mL/min/1.73_m2   15. CBC   Result Value Ref Range    WBC 4.66 4.3 - 10.0 10*3/uL    RBC 4.37 (L) 4.40 - 5.60 10*6/uL    Hemoglobin 14.8 13.0 - 18.0 g/dL    Hematocrit 45 11.9 - 50.0 %    MCV 104 (H) 81 - 98 fL    MCH 33.9 (H) 27.3 - 33.6 pg    MCHC 32.7 32.2 - 36.5 g/dL    Platelet Count 147  150 - 400 10*3/uL    RDW-CV 13.7 11.0 - 14.5 %   16. Magnesium   Result Value Ref Range    Magnesium 1.6 (L) 1.8 - 2.4 mg/dL   17. Blood Culture, Peripheral #1    Specimen: Blood    Blood~Line Collect Blood   Result Value Ref Range    Special Requests Aerobic and anaerobic bottles     Culture No growth 5 days    18. Blood Culture, Peripheral #2    Specimen: Blood    Blood~Hand, Left   Result Value Ref Range    Special Requests Aerobic and anaerobic bottles     Culture No growth 5 days    19. POC Glucose, Whole Blood - [facility]   Result Value Ref Range    Glucose, Finger Stick POC 109 62 - 125 mg/dL     *Note: Due to a large number of results and/or encounters for the requested time period, some results have not been displayed. A complete set of results can be found in Results Review.       No results found.    MEDICATION PRIOR TO VISIT  Reports   Outpatient Medications Prior to Visit   Medication Sig Dispense Refill    Acetaminophen 500 MG Oral Tab Take 3 tablets (1,500 mg) by mouth 2 times a day.      albuterol HFA (ProAir HFA) 108 (90 Base) MCG/ACT inhaler Inhale 2 puffs by mouth every 4 hours as needed for shortness of breath/wheezing for up to 10 days. Dispense with spacer 18 g 0    Alpha-Lipoic Acid 300 MG tablet Take 300 mg by mouth 2 times a day.      amLODIPine 5 MG tablet Take 1 tablet (5 mg) by mouth 2 times a day. 180 tablet 1    atorvastatin 10 MG tablet Take 1 tablet (10 mg) by mouth daily. 90 tablet 3    BENFOTIAMINE OR Take 250 mg by mouth every morning.      betamethasone dipropionate 0.05 % lotion APPLY TO SCALP AND EARS TOPICALLY 2 TIMES A DAY AS NEEDED FOR ITCHING. Notify MD of any signs of infection or skin thinning 60 mL 5    Cholecalciferol (VITAMIN D3) 2000 units  Oral Cap Take 1 capsule (2,000 Units) by mouth daily. For low level 1 capsule 1    clindamycin 1 % gel Apply 1 Application  topically 2 times a day as needed. Apply to back.      cyanocobalamin (Vitamin B-12) 1000 MCG tablet  Take 1 tablet (1,000 mcg) by mouth daily.      diclofenac 1 % gel Apply 4 g topically 4 times a day. Apply to 2 gram four times daily to neck, trapezius muscle, and low back. Use a max of 32 grams a day. (Patient taking differently: Apply 2-4 g topically 4 times a day as needed (pain).) 300 g 4    DULoxetine 60 MG DR capsule Take 1 capsule (60 mg) by mouth 2 times a day. 180 capsule 1    EPINEPHrine 0.3 MG/0.3ML auto-injector Inject one pen (0.3mg /mL) into the muscle for severe allergic reaction. Call 911. Use second pen if symptoms continue 2 each 0    ibuprofen 200 MG tablet Take 2 tablets (400 mg) by mouth every 8 hours as needed for pain.      lidocaine 5 % patch Apply 1 patch onto the skin daily. Apply to painful area for up to 12 hours in a 24 hour period. (Patient taking differently: Apply 1 patch onto the skin daily as needed. Apply to painful area for up to 12 hours in a 24 hour period.) 30 patch 2    lisinopril 20 MG tablet Take 1 tablet (20 mg) by mouth 2 times a day. 180 tablet 3    MAGNESIUM OR Take 2 tablets by mouth at bedtime.      mirabegron ER (Myrbetriq) 50 MG 24 hr tablet Take 1 tablet (50 mg) by mouth daily. 100 tablet 3    multivitamin with minerals tablet Take 1 tablet by mouth daily.      naltrexone 50 MG tablet TAKE 1 TABLET BY MOUTH DAILY WITH FOOD 30 tablet 11    Omega-3 Fatty Acids (OMEGA 3 OR) Take 1 tablet by mouth every morning.      pregabalin 300 MG capsule TAKE 1 CAPSULE BY MOUTH TWICE DAILY 180 capsule 0    primidone 50 MG tablet Take 1 tablet (50 mg) by mouth 2 times a day. 180 tablet 4     No facility-administered medications prior to visit.       REVIEW OF SYSTEMS -   Not reviewed if not marked   P=Positive  N=Negative      P   N    []  []  Constitutional:   []  []  Eyes:    []  []  Head ears, nose mouth throat:  []  []  Cardiovascular:    []  []  Respiratory:     []  [x]  Gastrointestinal:     []  [x]  Genitourinary:      [x]  []  Musculoskeletal:      []  []  Integumentary:     []  []   Neurological:     []  []  Psychiatric/Behavioral:      []  []  Endocrine:     []  []  Hematological:     []  []  Allergic/Immunologic:      []  []  Breast:   []  []  Male: GU /testicular:         PHYSICAL Exam    BP 127/81   Pulse 81   Temp 36.1 C (Temporal)   Resp 16   SpO2 94%     Physical Exam    Calm   In no acute distress  Heart RR no m  Lungs  cta  Legs are thin    IMPRESSION / PLAN / DISCUSSION   (I10) Essential hypertension  (primary encounter diagnosis)  (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    Diagnoses and all orders for this visit:    Essential hypertension  -     CBC with Diff; Future    Neuropathic pain  -     pregabalin 300 MG capsule; Take 1 capsule (300 mg) by mouth 2 times a day.    Essential tremor  -     primidone 50 MG tablet; Take 1 tablet (50 mg) by mouth 2 times a day.  -     Primidone and Phenobarbital; Future    Cervicalgia  -     DULoxetine 60 MG DR capsule; Take 1 capsule (60 mg) by mouth 2 times a day.    Chronic pain syndrome  -     DULoxetine 60 MG DR capsule; Take 1 capsule (60 mg) by mouth 2 times a day.    Low magnesium level  -     Comprehensive Metabolic Panel; Future  -     Magnesium; Future    Alcohol abuse    Hyperlipidemia, unspecified hyperlipidemia type  -     Lipid Panel; Future          Ask bartells if had the second shingrix    Insurance wants to come out for well, let them    Admission 6/30 over took med and syncope in setting of alcohol - he said he is not drinking much anymore   Report last drink Friday one glass of wine w dinner     Still taking pregabalin   No problem w this and needs this for long term     No longer having muscle relaxants     Will no longer take them   And will limit alcohol 1 glass of wine per week     As you are on a cholesterol pill as we are  drawing blood we will go ahead and order a lipid panel for you along with the primidone    See if you can dr Parke Poisson in person for 3 mo follow up and establish care     In past has made bad choices and unintentionally ignored his health including overusing medications / alcohol and he will try better - this is also ignoring the foot that lead to infection surgery     PHQ2 Total Score: 0      Working w pain clinic               No follow-ups on file.    Patient   express understanding of care plan/medications  Risk of taking medication properly and follow up discussed especially to call if problem    Created with the assistance of voice recognition software dictation

## 2023-03-11 ENCOUNTER — Encounter (HOSPITAL_BASED_OUTPATIENT_CLINIC_OR_DEPARTMENT_OTHER): Payer: Self-pay

## 2023-03-11 LAB — CYTOPATHOLOGY NON-GYN

## 2023-03-12 ENCOUNTER — Ambulatory Visit (INDEPENDENT_AMBULATORY_CARE_PROVIDER_SITE_OTHER): Payer: Medicare PPO | Admitting: Acupuncturist

## 2023-03-12 DIAGNOSIS — G8929 Other chronic pain: Secondary | ICD-10-CM

## 2023-03-12 DIAGNOSIS — M5459 Other low back pain: Secondary | ICD-10-CM

## 2023-03-12 NOTE — Progress Notes (Signed)
[place] [facility]    Acupuncture Follow-Up Visit    Treatment: # 10    Referred by: Renea Ee, MD     PCP: [name], MD    Primary Concern:   Chief Complaint   Patient presents with    Follow Up Visit     Acupuncture treatment for the chronic lower back pain       CC1: Chronic Lower back pain (Context from previous visit)  - Location: Bilateral lower back  - Onset; Chronic      Other related MSK issues  Neck and shoulder tension    Since the last visit  Bilateral Lower back   - Had epidural injection (03/05/2023) and it was helpful, and another injections planned in two weeks  Taking 2000 mg  (dropped the dosage by 1000 mg since August 2nd), Lyrica and Cymbalta  Today's pain level: 2-3/10    Related issue  - Neck pain   - Met with a neck doctor, and planning to have epidural injection on the neck, and hoping to avoid surgery.    - 4-5/10   - the pain bothers more while driving  - cannot look up for long time (e.g., cannot pain the ceiling)      No red flags present.   Contraindications to Care: Currently there are no red flags or contraindications to the conservative care outlined in this treatment plan.  Caution: None    Medications:  Outpatient Medications Prior to Visit   Medication Sig Dispense Refill    Acetaminophen 500 MG Oral Tab Take 3 tablets (1,500 mg) by mouth 2 times a day.      albuterol HFA (ProAir HFA) 108 (90 Base) MCG/ACT inhaler Inhale 2 puffs by mouth every 4 hours as needed for shortness of breath/wheezing for up to 10 days. Dispense with spacer 18 g 0    Alpha-Lipoic Acid 300 MG tablet Take 300 mg by mouth 2 times a day.      amLODIPine 5 MG tablet Take 1 tablet (5 mg) by mouth 2 times a day. 180 tablet 1    atorvastatin 10 MG tablet Take 1 tablet (10 mg) by mouth daily. 90 tablet 3    BENFOTIAMINE OR Take 250 mg by mouth every morning.      betamethasone dipropionate 0.05 % lotion APPLY TO SCALP AND EARS TOPICALLY 2 TIMES A DAY  AS NEEDED FOR ITCHING. Notify MD of any signs of infection or skin thinning 60 mL 5    Cholecalciferol (VITAMIN D3) 2000 units Oral Cap Take 1 capsule (2,000 Units) by mouth daily. For low level 1 capsule 1    clindamycin 1 % gel Apply 1 Application  topically 2 times a day as needed. Apply to back.      cyanocobalamin (Vitamin B-12) 1000 MCG tablet Take 1 tablet (1,000 mcg) by mouth daily.      diclofenac 1 % gel Apply 4 g topically 4 times a day. Apply to 2 gram four times daily to neck, trapezius muscle, and low back. Use a max of 32 grams a day. (Patient taking differently: Apply 2-4 g topically 4 times a day as needed (pain).) 300 g 4    DULoxetine 60 MG DR capsule Take 1 capsule (60 mg) by mouth 2 times a day. 180 capsule 1    EPINEPHrine 0.3 MG/0.3ML auto-injector Inject one pen (0.3mg /mL) into the muscle for severe allergic reaction. Call 911. Use second pen  if symptoms continue 2 each 0    ibuprofen 200 MG tablet Take 2 tablets (400 mg) by mouth every 8 hours as needed for pain.      lidocaine 5 % patch Apply 1 patch onto the skin daily. Apply to painful area for up to 12 hours in a 24 hour period. (Patient taking differently: Apply 1 patch onto the skin daily as needed. Apply to painful area for up to 12 hours in a 24 hour period.) 30 patch 2    lisinopril 20 MG tablet Take 1 tablet (20 mg) by mouth 2 times a day. 180 tablet 3    MAGNESIUM OR Take 2 tablets by mouth at bedtime.      mirabegron ER (Myrbetriq) 50 MG 24 hr tablet Take 1 tablet (50 mg) by mouth daily. 100 tablet 3    multivitamin with minerals tablet Take 1 tablet by mouth daily.      naltrexone 50 MG tablet TAKE 1 TABLET BY MOUTH DAILY WITH FOOD 30 tablet 11    Omega-3 Fatty Acids (OMEGA 3 OR) Take 1 tablet by mouth every morning.      pregabalin 300 MG capsule Take 1 capsule (300 mg) by mouth 2 times a day. 180 capsule 0    primidone 50 MG tablet Take 1 tablet (50 mg) by mouth 2 times a day. 180 tablet 4     No facility-administered  medications prior to visit.        Allergies:  Review of patient's allergies indicates:  Allergies   Allergen Reactions    Adhesives Skin: Rash     Glue on lido patches    Bee Venom Skin: Hives, Skin: Itching and Swelling    Gabapentin Other     Flu like symptoms, diarrhea, body ache upset stomach    Methocarbamol Other     Patient's wife reports cognitive difficulties ("goofy")          Objective:  Pulse  - slightly rapid, soft and deeper at the KD position  Tongue -  dusky red tongue with dry coating white and back at the midline, teeth marks.       Assessment/Plan:    Ronnie Mason  is following up with acupuncture and [place] medicine for consultation and treatment of:    (M54.50,  G89.29) Chronic bilateral low back pain without sciatica  (primary encounter diagnosis)  .     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     First set of needles: Supine  YT, DU20, SI3, UB62,     R: Shenmen, Brain,   L: cervical, Shen guan, KD2  Needles in: 11  Needles out: 11    Second set of needles: Supine   L: GB12, KD3  Needles in: 2  Needles out: 2    Manual Work: Gentle Gua sha on the neck and lower back with massage oil mixed with San Jetty applied (4 mins)   Pyonex press needle (0.62mm) were placed at Du14, [place], GB21, and SI11 bilaterally. I advised to keep them upto 5 days and take them off. However, if there is any irritability or discomfort felt, then  remove them sooner. I also advised to discard the used one safely.     Post Treatment Note:   Ronnie Mason tolerated the all the treatment well without any complications.      Time Spent:   - First set of treatment - Face to face  time: 15 mins  - Second set of treatment - Face to face time: 8 mins  Total: Time spent for face-to-face for acupuncture: 23 minutes.    Prognosis: the patient's progress will be closely monitored and if no improvement with this series of treatment, a referral will be recommended.    Augusto Gamble, AEMP, LAc   Licensed Acupuncturist, Paramedic for [employer]  Department of Family medicine  [place] [facility] [place]

## 2023-03-13 ENCOUNTER — Other Ambulatory Visit (INDEPENDENT_AMBULATORY_CARE_PROVIDER_SITE_OTHER): Payer: Self-pay | Admitting: Internal Medicine

## 2023-03-13 DIAGNOSIS — M5417 Radiculopathy, lumbosacral region: Secondary | ICD-10-CM

## 2023-03-13 DIAGNOSIS — M542 Cervicalgia: Secondary | ICD-10-CM

## 2023-03-15 ENCOUNTER — Ambulatory Visit (HOSPITAL_BASED_OUTPATIENT_CLINIC_OR_DEPARTMENT_OTHER): Payer: Medicare PPO | Admitting: Anesthesiology

## 2023-03-16 NOTE — Telephone Encounter (Signed)
Patient reported allergy to adhesive in lidocaine patch.    Defer to PCP - okay to continue?

## 2023-03-17 MED ORDER — LIDOCAINE 5 % EX PTCH
1.0000 | MEDICATED_PATCH | Freq: Every day | CUTANEOUS | 3 refills | Status: DC | PRN
Start: 2023-03-17 — End: 2024-06-21

## 2023-03-19 ENCOUNTER — Telehealth (INDEPENDENT_AMBULATORY_CARE_PROVIDER_SITE_OTHER): Payer: Self-pay | Admitting: Acupuncturist

## 2023-03-19 ENCOUNTER — Ambulatory Visit (INDEPENDENT_AMBULATORY_CARE_PROVIDER_SITE_OTHER): Payer: Medicare PPO | Admitting: Acupuncturist

## 2023-03-19 DIAGNOSIS — M5459 Other low back pain: Secondary | ICD-10-CM

## 2023-03-19 DIAGNOSIS — G8929 Other chronic pain: Secondary | ICD-10-CM

## 2023-03-19 DIAGNOSIS — M545 Low back pain, unspecified: Secondary | ICD-10-CM

## 2023-03-19 NOTE — Progress Notes (Addendum)
[place] [facility]    Acupuncture Follow-Up Visit    Treatment: # 11    Referred by: No ref. provider found     PCP: [name], MD    Primary Concern:   Chief Complaint   Patient presents with    Acupuncture     chronic lower back pain       CC1: Chronic Lower back pain (Context from previous visit)  - Location: Bilateral lower back  - Onset; Chronic      Other related MSK issues  Neck and shoulder tension     Since the last visit  - Both neck and back are both doing better  - Back pain level 3/10, morning upon waking up is the most painful time. Takes a couple hours to loosen up.   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   - His doctor noted he can go back to PT - was taking break after the lower back procedure.     Contraindications to Care: Currently there are no red flags or contraindications to the conservative care outlined in this treatment plan.      Medications:  Outpatient Medications Prior to Visit   Medication Sig Dispense Refill    Acetaminophen 500 MG Oral Tab Take 3 tablets (1,500 mg) by mouth 2 times a day.      albuterol HFA (ProAir HFA) 108 (90 Base) MCG/ACT inhaler Inhale 2 puffs by mouth every 4 hours as needed for shortness of breath/wheezing for up to 10 days. Dispense with spacer 18 g 0    Alpha-Lipoic Acid 300 MG tablet Take 300 mg by mouth 2 times a day.      amLODIPine 5 MG tablet Take 1 tablet (5 mg) by mouth 2 times a day. 180 tablet 1    atorvastatin 10 MG tablet Take 1 tablet (10 mg) by mouth daily. 90 tablet 3    BENFOTIAMINE OR Take 250 mg by mouth every morning.      betamethasone dipropionate 0.05 % lotion  APPLY TO SCALP AND EARS TOPICALLY 2 TIMES A DAY AS NEEDED FOR ITCHING. Notify MD of any signs of infection or skin thinning 60 mL 5    Cholecalciferol (VITAMIN D3) 2000 units Oral Cap Take 1 capsule (2,000 Units) by mouth daily. For low level 1 capsule 1    clindamycin 1 % gel Apply 1 Application  topically 2 times a day as needed. Apply to back.      cyanocobalamin (Vitamin B-12) 1000 MCG tablet Take 1 tablet (1,000 mcg) by mouth daily.      diclofenac 1 % gel Apply 4 g topically 4 times a day. Apply to 2 gram four times daily to neck, trapezius muscle, and low back. Use a max of 32 grams a day. (Patient taking differently: Apply 2-4 g topically 4 times a day as needed (pain).) 300 g 4    DULoxetine 60 MG DR capsule Take 1 capsule (60 mg) by mouth 2 times a day. 180 capsule 1    EPINEPHrine 0.3 MG/0.3ML auto-injector Inject one pen (0.3mg /mL)  into the muscle for severe allergic reaction. Call 911. Use second pen if symptoms continue 2 each 0    ibuprofen 200 MG tablet Take 2 tablets (400 mg) by mouth every 8 hours as needed for pain.      lidocaine 5 % patch Apply 1 patch onto the skin daily as needed. Apply to painful area for up to 12 hours in a 24 hour period. 30 patch 3    lisinopril 20 MG tablet Take 1 tablet (20 mg) by mouth 2 times a day. 180 tablet 3    MAGNESIUM OR Take 2 tablets by mouth at bedtime.      mirabegron ER (Myrbetriq) 50 MG 24 hr tablet Take 1 tablet (50 mg) by mouth daily. 100 tablet 3    multivitamin with minerals tablet Take 1 tablet by mouth daily.      naltrexone 50 MG tablet TAKE 1 TABLET BY MOUTH DAILY WITH FOOD 30 tablet 11    Omega-3 Fatty Acids (OMEGA 3 OR) Take 1 tablet by mouth every morning.      pregabalin 300 MG capsule Take 1 capsule (300 mg) by mouth 2 times a day. 180 capsule 0    primidone 50 MG tablet Take 1 tablet (50 mg) by mouth 2 times a day. 180 tablet 4     No facility-administered medications prior to visit.        Allergies:  Review of patient's allergies  indicates:  Allergies   Allergen Reactions    Adhesives Skin: Rash     Glue on lido patches    Bee Venom Skin: Hives, Skin: Itching and Swelling    Gabapentin Other     Flu like symptoms, diarrhea, body ache upset stomach    Methocarbamol Other     Patient's wife reports cognitive difficulties ("goofy")          Objective:  Pulse  - Think  Tongue - Deferred. Patient wearing a face covering.    Assessment/Plan:    [place]  is following up with acupuncture and [place] medicine for consultation and treatment of:    (M54.50,  G89.29) Chronic bilateral low back pain without sciatica  (primary encounter diagnosis)  .     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     First set of needles: Supine  Mid/Bilateral: YT, DU24, UB62  R: Shenmen, Sympathtic, cervial, [address], [place], [place]  L: KD2    Needles in: 9  Needles out: 9    Second set of needles:Supine  Mid/Bilateral: Ba Feng (between 2nd and 3rd, 4th and 5th) - 2    Needles in: 4  Needles out: 4    Gentle Guasha on lower back and neck with massage oil applied.    Pyonex press needle (0.18mm) were placed at Du14, Bilateral SI11, [place], L - GB31, GB32, GB34. I advised to keep them upto 5 days and take them off. However, if there is any irritability or discomfort felt, then remove them sooner. I also advised to discard the used one safely.       Post Treatment Note:   Jorden tolerated the all the  treatment well without any complications.      Time Spent:   - First set of treatment - Face to face time: 15 mins  - Second set of treatment - Face to face time: 8 mins  Total: Time spent for face-to-face for acupuncture: 23  minutes.      Augusto Gamble, AEMP, LAc   Licensed Acupuncturist, Paramedic for [employer]  Department of Family medicine  [place] [facility] [place]

## 2023-03-19 NOTE — Telephone Encounter (Signed)
Sent Mychart message to pt.

## 2023-03-20 ENCOUNTER — Other Ambulatory Visit (INDEPENDENT_AMBULATORY_CARE_PROVIDER_SITE_OTHER): Payer: Self-pay | Admitting: Internal Medicine

## 2023-03-20 DIAGNOSIS — E785 Hyperlipidemia, unspecified: Secondary | ICD-10-CM

## 2023-03-23 MED ORDER — ATORVASTATIN CALCIUM 10 MG OR TABS
10.0000 mg | ORAL_TABLET | Freq: Every day | ORAL | 0 refills | Status: DC
Start: 2023-03-23 — End: 2023-06-20

## 2023-03-23 NOTE — Telephone Encounter (Signed)
NV 05/10/23

## 2023-03-26 ENCOUNTER — Ambulatory Visit (INDEPENDENT_AMBULATORY_CARE_PROVIDER_SITE_OTHER): Payer: Medicare PPO | Admitting: Acupuncturist

## 2023-03-26 DIAGNOSIS — G8929 Other chronic pain: Secondary | ICD-10-CM

## 2023-03-26 DIAGNOSIS — M545 Low back pain, unspecified: Secondary | ICD-10-CM

## 2023-03-26 DIAGNOSIS — M5459 Other low back pain: Secondary | ICD-10-CM

## 2023-03-26 NOTE — Progress Notes (Signed)
[place] [facility]    Acupuncture Follow-Up Visit  Referred by: Drexel Iha, MD   PCP: [name], MD    Primary Concern:   Chief Complaint   Patient presents with    Acupuncture     chronic lower back pain       CC1: Chronic Lower back pain (Context from previous visit)  - Location: Bilateral lower back  - Onset; Chronic      Other related MSK issues  Neck and shoulder tension     Since the last visit  - Lower back pain started bothering on Sunday morning.   - The lower back felt locked up and diffcutly moving - was not able to do anything for three days.   - Today is not great, but can move round   - Back pain level 6-7/10, morning upon waking up is the most painful time.   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 In the past there was no issue, but note lately gets skin irritation after few days of     Medications:  Outpatient Medications Prior to Visit   Medication Sig Dispense Refill    Acetaminophen 500 MG Oral Tab Take 3 tablets (1,500 mg) by mouth 2 times a day.      albuterol HFA (ProAir HFA) 108 (90 Base) MCG/ACT inhaler Inhale 2 puffs by mouth every 4 hours as needed for shortness of breath/wheezing for up to 10 days. Dispense with spacer 18 g 0    Alpha-Lipoic Acid 300 MG tablet Take 300 mg by mouth 2 times a day.      amLODIPine 5 MG tablet Take 1 tablet (5 mg) by mouth 2 times a day. 180 tablet 1    atorvastatin 10 MG tablet Take 1 tablet (10 mg) by mouth daily. 90 tablet 0    BENFOTIAMINE OR Take 250 mg by mouth every morning.      betamethasone dipropionate 0.05 % lotion APPLY TO SCALP AND EARS TOPICALLY 2 TIMES A DAY AS NEEDED FOR ITCHING. Notify MD of any signs of infection or skin thinning 60 mL 5     Cholecalciferol (VITAMIN D3) 2000 units Oral Cap Take 1 capsule (2,000 Units) by mouth daily. For low level 1 capsule 1    clindamycin 1 % gel Apply 1 Application  topically 2 times a day as needed. Apply to back.      cyanocobalamin (Vitamin B-12) 1000 MCG tablet Take 1 tablet (1,000 mcg) by mouth daily.      diclofenac 1 % gel Apply 4 g topically 4 times a day. Apply to 2 gram four times daily to neck, trapezius muscle, and low back. Use a max of 32 grams a day. (Patient taking differently: Apply 2-4 g topically 4 times a day as needed (pain).) 300 g 4    DULoxetine 60 MG DR capsule Take 1 capsule (60 mg) by mouth 2 times a day. 180 capsule 1    EPINEPHrine 0.3 MG/0.3ML auto-injector Inject one pen (0.3mg /mL) into the muscle for severe allergic reaction. Call 911. Use second pen if symptoms continue 2 each 0    ibuprofen 200 MG tablet Take 2  tablets (400 mg) by mouth every 8 hours as needed for pain.      lidocaine 5 % patch Apply 1 patch onto the skin daily as needed. Apply to painful area for up to 12 hours in a 24 hour period. 30 patch 3    lisinopril 20 MG tablet Take 1 tablet (20 mg) by mouth 2 times a day. 180 tablet 3    MAGNESIUM OR Take 2 tablets by mouth at bedtime.      mirabegron ER (Myrbetriq) 50 MG 24 hr tablet Take 1 tablet (50 mg) by mouth daily. 100 tablet 3    multivitamin with minerals tablet Take 1 tablet by mouth daily.      naltrexone 50 MG tablet TAKE 1 TABLET BY MOUTH DAILY WITH FOOD 30 tablet 11    Omega-3 Fatty Acids (OMEGA 3 OR) Take 1 tablet by mouth every morning.      pregabalin 300 MG capsule Take 1 capsule (300 mg) by mouth 2 times a day. 180 capsule 0    primidone 50 MG tablet Take 1 tablet (50 mg) by mouth 2 times a day. 180 tablet 4     No facility-administered medications prior to visit.        Allergies:  Review of patient's allergies indicates:  Allergies   Allergen Reactions    Adhesives Skin: Rash     Glue on lido patches    Bee Venom Skin: Hives, Skin: Itching and Swelling     Gabapentin Other     Flu like symptoms, diarrhea, body ache upset stomach    Methocarbamol Other     Patient's wife reports cognitive difficulties ("goofy")          Objective:  Pulse  - slightly rapid and thin  Tongue - Deferred. Patient wearing a face covering.        Assessment/Plan:    [place]  is following up with acupuncture and [place] medicine for consultation and treatment of:    (M54.50,  G89.29) Chronic bilateral low back pain without sciatica  (primary encounter diagnosis)  .     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     First set of needles: Supine  Mid/Bilateral: YT, GB31  R: [place], [place], Zong bai, xia Bai  L: YNSA D1, [place]y [place]ager, Da bai, [place]     Needles in: 11  Needles out: 11    Second set of needles:Supine  Mid/Bilateral: Ba xie (3)     Needles in: 6  Needles out: 6    Pyonex press needle (0.30mm) were placed at Bilateral HTJJ C7, GB12, L4, GB31, and GB39, L - GB12, GB21, SI11. I advised to keep them upto 5 days and take them off. However, if there is any irritability or discomfort felt, then remove them sooner. I also advised to discard the used one safely.       Post Treatment Note:   Shaphan tolerated the all the treatment well without any complications.      Time Spent:   - First set of treatment - Face to face time: 15 mins  - Second set of treatment - Face to face time: 8 mins  Total: Time spent for face-to-face  for acupuncture: 23 minutes.    Prognosis: the patient's progress will be closely monitored and if no improvement with this series of treatment, a referral will be recommended.    Augusto Gamble, AEMP, LAc   Licensed Acupuncturist, Paramedic for [employer]  Department of Family medicine  [place] [facility] [place]

## 2023-03-30 ENCOUNTER — Telehealth (INDEPENDENT_AMBULATORY_CARE_PROVIDER_SITE_OTHER): Payer: Self-pay | Admitting: Acupuncturist

## 2023-03-30 NOTE — Telephone Encounter (Signed)
Sent Mychart message to pt.

## 2023-04-01 ENCOUNTER — Telehealth (INDEPENDENT_AMBULATORY_CARE_PROVIDER_SITE_OTHER): Payer: Self-pay | Admitting: Acupuncturist

## 2023-04-01 ENCOUNTER — Ambulatory Visit: Payer: Medicare PPO | Attending: Anesthesiology | Admitting: Anesthesiology

## 2023-04-01 VITALS — BP 142/83 | HR 86 | Temp 97.5°F

## 2023-04-01 DIAGNOSIS — M5417 Radiculopathy, lumbosacral region: Secondary | ICD-10-CM | POA: Insufficient documentation

## 2023-04-01 DIAGNOSIS — M47812 Spondylosis without myelopathy or radiculopathy, cervical region: Secondary | ICD-10-CM | POA: Insufficient documentation

## 2023-04-01 DIAGNOSIS — M545 Low back pain, unspecified: Secondary | ICD-10-CM | POA: Insufficient documentation

## 2023-04-01 DIAGNOSIS — G894 Chronic pain syndrome: Secondary | ICD-10-CM | POA: Insufficient documentation

## 2023-04-01 DIAGNOSIS — G8929 Other chronic pain: Secondary | ICD-10-CM

## 2023-04-01 MED ORDER — IOHEXOL 240 MG/ML IJ SOLN
5.0000 mL | Freq: Once | INTRAMUSCULAR | Status: AC
Start: 2023-04-01 — End: 2023-04-07
  Administered 2023-04-07: 5 mL via EPIDURAL

## 2023-04-01 NOTE — Telephone Encounter (Signed)
I called back to pt's preferred phone number and spoke to pt. I informed Dr. Louie Casa cancellation information to pt, and pt stated those available slots were too early, so he could not make any appointment. I'll continue to keep an eye on our cancellation and when we have any cancellation slots, I'll let him know that.

## 2023-04-01 NOTE — Telephone Encounter (Signed)
RETURN CALL: Voicemail - Detailed Message      SUBJECT:  Appointment Request     REASON FOR VISIT: accupuncture    PREFERRED DATE/TIME: first available    REASON UNABLE TO APPOINT: per return call SM instructions    Patient calling back due to clinic calling to schedule appointment but didn't leave any dates that were available.     Patient hasn't been able to connect with clinic and would like clinic to call him back to discuss options.

## 2023-04-01 NOTE — Telephone Encounter (Signed)
LVM and sent My chart message to pt

## 2023-04-01 NOTE — Progress Notes (Signed)
CHIEF COMPLAINT:   Chief Complaint   Patient presents with    Pain       HISTORY OF PRESENT ILLNESS:    The purpose of today's visit is interval reevaluation of multiple pain complaints. 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 He is also a candidate for cervical decompression and fusion (likely C4-6 ACDF). The patient was last seen by Ronnie Mason on 03/08/23 at which time we recommended bilateral C5-6, C6-7 medial branch blocks (scheduled with Dr. Dorna Bloom 9/4 and 10/4).     Caudal ESI (03/05/23) provided relief for about one week. Prior to procedure, pain was 6/10 in severity. Pain was reduced to 3/10 for one week then gradually returned to pre-procedure levels. Pain is worse in the mornings but will improve during day. Patient is using cane more often now and having difficulty navigating uneven grounds. Walking in pool and acupuncture have been slightly beneficial for pain.     Cervical interlaminar ESI (C7-T1) from 11/04/22 continue to provide improvements in neck pain but patient reports the pain is starting to worsen again. He is excited for upcoming MBB next week.    Labs/Imaging Reviewed:    Reviewed Lumbar MRI with patient        REVIEW OF SYSTEMS:  The review of systems documented by our clinic staff was provided by the patient. I did review the ROS and have no further detail to add.    PHYSICAL EXAMINATION:  BP (!) 142/83   Pulse 86   Temp 36.4 C (Temporal)   SpO2 94%    Constitutional:Constitutional: well-appearing, well-groomed, and cooperative   Skin, exposed parts: color normal  Eyes: Negative for miosis bilaterally. Pupils appropriate for room lighting.  Extraocular movements are intact bilaterally  Cardiovascular: No cyanosis, no dilation of jugular veins, no dyspnea. Bilateral radial pulses are  intact and regular   Respiratory: Respirations are unlabored. No accessory muscle use noted      IMPRESSION:   Ronnie Mason is a 72 year old male with   Progressive left neck pain without radiation to left lateral upper armconsistent with cervical facet joint syndrome, particularly at the bilateral C5-6, C6-7 levels. Evaluated by Dr. Welton Flakes on 09/01/22 and determined to be likely candidate for C4-6 ACDF. Responded well to CESI on 12/04/2022 but due to lack of radiculitis, not indicated at this time. Scheduled for diagnostic MBB with Dr. Dorna Bloom  Low back pain 2/2 facet arthropathy with improvement after L4-5, L5-S1 RFA and lumbosacral radiculopathy with improvement after recent caudal ESI. Additional components of myofascial pain syndrome, central sensitization.  Right hip pain 2/2 severe OA.  Prior treatment failures including short duration of benefit for interlaminar lumbar epidural steroids and TFESI, MBB, memantine, Naltrexone, tizanidine (sedation)  Left IT band syndrome  Significant medical comorbidities significant for multiple pain syndromes (neck pain, shoulder pain previously, groin/hip pain, prior greater trochanteric pain syndrome, R foot fracture, R lateral malleolus fracture), social hx positive for alcohol use disorder recently relapsed and in treatment, hx of possible NPH, hx of spontaneous intraparenchymal intracranial bleed (including thalamus), mild TBI, among others    PLAN:     We discussed our impressions and the following suggestions/options in detail and  provided him  with information in the after visit summary.  [name] had no further questions.    Diagnostic evaluation:   Patient with minimal improvement following caudal ESI, not candidate for repeat caudal ESI. Patient interested in pursuing any intervention that will delay lumbar spine surgery.  Upon review of MRI L Spine (09/02/22), suboptimal anatomy for both transforaminal ESI and L4-5 ESI. Patient desires to pursue a LESI at a more cephalad level with attempt to place catheter for caudal injection.     Further options of lumbar spine surgery and lumbar SCS were discussed with patient. He was relatively uninterested at this time but willing to reestablish with Dr. Welton Flakes (neurosurgery) for discussion of surgical options for lumbar spine.     Rehabilitation:   Continue acupuncture    Medication suggestions:   Continue pregabalin 300mg  BID  Continue duloxetine 60mg  BID  Continue lidocaine patch    Procedure suggestions:   Plan for diagnostic MBB at bilateral C5-6, C6-7 levels    Plan to proceed with L3-4 ESI with catheter to direct to lower level (will be unable to enter lower and prior caudal not successful)    Depending on progression of symptoms can consider CT-guided C4-5 transforaminal as previously suggested    Will contact Dr. Milta Deiters office for discussion of surgical options for lumbar spine    Lumbar SCS could be considered in future after neck symptoms addressed (likely would not make sense until after surgery). Unclear if lumbar surgery will be an option.    In general, we have been working hard to keep patient out of operating room as long as possible. Unfortunately procedures have been of mixed effect    Follow-up: For procedure

## 2023-04-01 NOTE — Progress Notes (Unsigned)
DIAGNOSTIC NERVE BLOCK OF THE CERVICAL MEDIAL BRANCHES # 1    04/07/2023  PROCEDURE:  Cervical medial branch block  bilateral, Levels: C5-6, C6-7    PRE-PROCEDURE DIAGNOSIS  (Z61.096) Cervical facet joint syndrome  (primary encounter diagnosis).    POST-PROCEDURE DIAGNOSIS  (M47.812) Cervical facet joint syndrome  (primary encounter diagnosis).    INDICATION FOR INJECTION  Mr. Coccaro has chronic, ongoing axial spine pain, the goal of today's diagnostic, local anesthetic procedure is to determine if the cervical facet joints are the primary source of his pain.  For Mr. Dios, the procedure is indicated for the following reasons: 1) moderate to severe pain has been present for more than 3 months with functional impairment; 2) he did not respond to conservative treatment efforts including physical therapy and medications including  for longer than 3 months; 3) Mr. Beaver pain is characteristic of facet pain and is primarily axial in nature without clear radicular symptoms or other neurological components; and 4) his imaging does not reveal another source of pain (please see CPR  note dated 03/08/23 for the image summary; new imaging: NONE).    We have discussed benefits, risks, and alternatives for the diagnostic blocks, and the potential for future radiofrequency ablation. Mr. Bazan would like to undergo the procedures and signed the informed consent.      ATTENDING PHYSICIAN:  Wendi Maya MD    FELLOW / Hampton Abbot, DO    SEDATION:  NO    NPO STATUS: NO    INTRAVENOUS LINE:  NO    DESCRIPTION OF PROCEDURE  Position: Prone with the head resting in a positioning device.  Imaging technique: Multiplanar fluoroscopic guidance, appropriate images reviewed and saved.  Monitoring: Heart rate, pulse oximetry, blood pressure.  Time-out/final verification: Performed and documented in the nursing note.  The injection area was prepped with ChloraPrep and draped in a sterile fashion; sterile technique used for the  entire procedure.    An anterior-posterior fluoroscopic image allowed symmetric visualization of the upper and lower endplates at the targeted levels (bilateral C5, C6, C7).  An entry point caudal and lateral to each target was identified and and the skin was anesthetized with 1% lidocaine  without added sodium bicarbonate.  {GAUGE NEEDLE:101563} gauge needles were inserted under fluoroscopic control (total of {1-6:106751} needles bilaterally). Multiplanar images confirmed position of the needle tips at the midportion of the articular pillar at each location. Omnipaque 240 {NUMBERS STEVE:100624} ml was injected at each site under live fluoroscopy confirming appropriate spread without vascular uptake.  Subsequently, {NUMBERS STEVE:100624} ml of bupivacaine 0.5% was injected at each site and the needles were removed uneventfully.     POST-PROCEDURE  Mr. Zech was transported to the recovery area ***in stable condition for observation.  On re-examination more than 15 minutes after the procedure:  Mr. Strough {DID:100046} note reduction in pain.  Mr. Roughton {DID:100046} demonstrate improvement in range of motion.   Mr. Kostas had no evidence of motor block.  The pain score was  {NUMBERS 0-10:102198}/10 prior to the procedure and {NUMBERS 0-10:102198}/10 after the procedure (NRS 0-10, with 0 = no pain, 10 = worst pain imaginable).    INTERPRETATION  The 6h pain recording will be completed in order to fully evaluate the result of this diagnostic procedure.  Based on the initial results, the tested facet joints {ARE:200059} a likely pain source for Mr. Freeze.     COMPLICATIONS  {.:108187}.    PLAN  The pain intensity will be monitored  by 6h pain diary.  Subsequent care is dependent on the results of this procedure.   ***  Rosalyn Gess, DO

## 2023-04-01 NOTE — Progress Notes (Deleted)
CHIEF COMPLAINT:   No chief complaint on file.      HISTORY OF PRESENT ILLNESS:    The purpose of today's visit is interval reevaluation of multiple pain complaints. 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 He is also a candidate for cervical decompression and fusion (likely C4-6 ACDF). The patient was last seen by Ronnie Mason on 03/08/23 at which time we recommended bilateral C5-6, C6-7 medial branch blocks (scheduled with Dr. Dorna Mason 9/4 and 10/4).     ***    Labs/Imaging Reviewed:  {Labs  Imaging:999}  ***None    {Problem List  Medical Hx  Surgical Hx  Family Hx  Substance & Sexual Hx  Social Documentation:999}    REVIEW OF SYSTEMS:  The review of systems documented by our clinic staff was provided by the patient. I {DID/DID NOT:27244::"did"} review the ROS and {pain clinic ROS comments:112481}    PHYSICAL EXAMINATION:  There were no vitals taken for this visit.   Constitutional:{Constitutional:110805}   Skin, exposed parts: {:109455}  Eyes: ***Negative for miosis bilaterally. Pupils ***appropriate for room lighting. Extraocular movements are ***intact bilaterally  Cardiovascular: ***No cyanosis, no dilation of jugular veins, no dyspnea. Bilateral radial pulses are ***intact and regular   Respiratory: Respirations are ***unlabored. ***No accessory muscle use noted  Gastrointestinal : Abdomen is ***soft, ***non-tender.  Neurologic: ***  Musculoskeletal: ***  Psychiatric: Judgement/insight are ***. Mood is ***. Affect is ***. Patient is oriented to {Orientation:111934}    IMPRESSION:   Ronnie Mason is a 72 year old male with   Progressive left neck pain without  radiation to left lateral upper armconsistent with cervical facet joint syndrome, particularly at the bilateral C5-6, C6-7 levels. Evaluated by Dr. Welton Mason on 09/01/22 and determined to be likely candidate for C4-6 ACDF. Responded well to CESI on 12/04/2022 but due to lack of radiculitis, not indicated at this time. Scheduled for diagnostic MBB with Dr. Dorna Mason  Low back pain 2/2 facet arthropathy with improvement after L4-5, L5-S1 RFA and lumbosacral radiculopathy with improvement after recent caudal ESI. Additional components of myofascial pain syndrome, central sensitization.  Right hip pain 2/2 severe OA.  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  Ronnie Mason had no further questions.    Diagnostic evaluation: ***    Rehabilitation:   ***Continue acupuncture    Medication suggestions:   ***Continue pregabalin 300mg  BID  ***Continue duloxetine 60mg  BID  ***Continue lidocaine patch    Procedure suggestions:   ***Plan for diagnostic MBB at bilateral C5-6, C6-7 levels. Explained expectations of procedure    ***Plan to proceed with  repeat caudal     Depending on progression of symptoms can consider CT-guided C4-5 transforaminal as previously suggested    Lumbar SCS could be  considered in future after neck symptoms addressed (likely would not make sense until after surgery)    Follow-up: ***

## 2023-04-01 NOTE — Progress Notes (Signed)
I saw and evaluated the patient with Nancee Liter, who conducted the initial history.  I reviewed and confirmed the history in detail with Mr. Monaco in person.   I was present for the examination and formulation portions of the encounter. I agree with the findings and the plan of care as documented in our notes. I did edit the trainee's note.    I spent a total of 35 minutes for the patient's care on the date of the service.          [name], MD  Medical Director   [facility]  [facility]   Department of Anesthesiology and Pain Medicine

## 2023-04-02 ENCOUNTER — Ambulatory Visit (INDEPENDENT_AMBULATORY_CARE_PROVIDER_SITE_OTHER): Payer: Medicare PPO | Admitting: Acupuncturist

## 2023-04-02 ENCOUNTER — Encounter (HOSPITAL_BASED_OUTPATIENT_CLINIC_OR_DEPARTMENT_OTHER): Payer: Self-pay

## 2023-04-06 NOTE — Progress Notes (Signed)
 [facility] FOLLOW UP VISIT    CHIEF COMPLAINT:   Chief Complaint   Patient presents with    Procedure     C5-C6 and C6-C7 MBB     HISTORY OF PRESENT ILLNESS:  Last seen by Ronnie Sartorius, MD on 04/01/23:  From that visit:  "Ronnie Mason is a 72 year old male with   Progressive left neck pain without radiation to left lateral upper armconsistent with cervical facet joint syndrome, particularly at the bilateral C5-6, C6-7 levels. Evaluated by Dr. Welton Flakes on 09/01/22 and determined to be likely candidate for C4-6 ACDF. Responded well to CESI on 12/04/2022 but due to lack of radiculitis, not indicated at this time. Scheduled for diagnostic MBB with Dr. Dorna Bloom  Low back pain 2/2 facet arthropathy with improvement after L4-5, L5-S1 RFA and lumbosacral radiculopathy with improvement after recent caudal ESI. Additional components of myofascial pain syndrome, central sensitization.  Right hip pain 2/2 severe OA.  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  [name] had no further questions.     Diagnostic evaluation:   Patient with minimal improvement following caudal ESI, not candidate for repeat caudal ESI. Patient interested in pursuing any intervention that will delay lumbar spine surgery. Upon review of  MRI L Spine (09/02/22), suboptimal anatomy for both transforaminal ESI and L4-5 ESI. Patient desires to pursue a LESI at a more cephalad level with attempt to place catheter for caudal injection.      Further options of lumbar spine surgery and lumbar SCS were discussed with patient. He was relatively uninterested at this time but willing to reestablish with Dr. Welton Flakes (neurosurgery) for discussion of surgical options for lumbar spine.      Rehabilitation:   Continue acupuncture     Medication suggestions:   Continue pregabalin 300mg  BID  Continue duloxetine 60mg  BID  Continue lidocaine patch     Procedure suggestions:   Plan for diagnostic MBB at bilateral C5-6, C6-7 levels     Plan to proceed with L3-4 ESI with catheter to direct to lower level (will be unable to enter lower and prior caudal not successful)     Depending on progression of symptoms can consider CT-guided C4-5 transforaminal as previously suggested     Will contact Dr. Milta Deiters office for discussion of surgical options for lumbar spine     Lumbar SCS could be considered in future after neck symptoms addressed (likely would not make sense until after surgery). Unclear if lumbar surgery will be an option.     In general, we have been working hard to keep patient out of operating room as long as possible. Unfortunately procedures have been of mixed effect"     Today he would like to discuss his axial neck pain prior to planned medial branch blocks.  He complains of bilateral constant axial lower neck pain worse with extension  without radicular pain.  He has referred discomfort to bilateral posterior shoulders and trapezii.  He denies referred pain to lower scapular border.  He will follow-up with Dr. Araceli Bouche in mid-September.    Current medications related to pain and related conditions include:  Pregabalin 300mg  bid  Duloxetine 60mg  bid  Lidocaine 5% patch    Past pain treatments/diagnostic testing include (copied forward and updated from the [facility] note dated 03/08/2023):   Dr. [name]  03/23/2018 - LEFT L5 TFESI with 80-85% relief for about 3-4 weeks of just axial back pain.   09/15/2017 - Bilateral L5 TFESI with 50-75% relief of mixture of low back and leg pain.   05/19/2017 - RIGHT L5 TFESI with 100% relief of Radicular symptoms for about 3 months  Dr. Amada Jupiter  Injections in our clinic have included (exluding MBB and trigger point injections):  08/12/2016 - L5-S1 ILESI  07/01/2016 - Ultrasound guided right trochanteric bursa injection   05/20/2016 - Bilateral shoulder injections  05/13/2016 - Right L3 L4 L5/S1 RFA  02/19/2016 -  Left L3, L4, L5/S1 RFA  12/27/2012 - L5-S1 IL ESI  08/31/2012 - Left L5 TFESI  03/30/2012 - Left L4/5, L5/S1 facet inj  02/10/2012 - Left C6 C7 MB RFA  07/03/2011 - Left L3, L4, L5/S1 RFA  05/12/2011 - Left C4, C5, C6 RFA     Labs/Imaging Reviewed:  MRI C Spine wo Contrast 09/03/21  AXIAL DISCS, DURAL COMPRESSION & FORAMINA:  C2-3:  Mild left facet and uncovertebral joint osteoarthritis resulting in mild left foraminal stenosis.   C3-4:  Mild diffuse disc bulge with effacement of anterior thecal sac without significant central stenosis. Bilateral facet and uncovertebral joint osteoarthritis resulting in moderate to severe right and severe left foraminal stenosis   C4-5:  Mild diffuse disc bulge and hypertrophy of ligamentum flavum resulting in mild to moderate central stenosis. Bilateral facet and uncovertebral joint osteoarthritis resulting in moderate bilateral foraminal narrowing.   C5-6:  Mild diffuse disc bulge. Mild effacement of anterior thecal sac without significant central stenosis. Bilateral facet and uncovertebral joint osteoarthritis resulting in moderate right and moderate to severe left foraminal stenosis.   C6-7:  Mild diffuse disc bulge. No significant central stenosis. Bilateral facet and uncovertebral joint osteoarthritis resulting in severe right and moderate to severe left foraminal stenosis.   C7-T1:  No central  or foraminal stenosis.  Facets are normal.      IMPRESSION  1. Multilevel lumbar spondylosis as detailed above more prominence at C4-C5 where there is mild to moderate central and moderate bilateral foraminal narrowing.  2. Multilevel moderate to severe neural foraminal stenosis at other levels as detailed above.     MRI L Spine wo Contrast 09/02/22  1.  Progression of multilevel degenerative change resulting in moderate L3-L4 spinal canal stenosis with crowding of the cauda equina nerve roots.  2.  Moderate left L3-L4 and right L5-S1 foraminal narrowing.MRI L Spine wo Contrast 09/02/22     XR L Spine 4+ View 08/25/22  FINDINGS AND IMPRESSION  Straightening with mild dextrocurvature.     Vertebral body heights are maintained.     Similar appearance of moderate to advanced multilevel degenerative change most significant at L3-L4 through L5-S1 as evidenced by disc space narrowing, endplate spurring and facet degeneration.     Overlying soft tissues are grossly unremarkable.     EMG 08/25/22:  "IMPRESSION:     Abnormal study     There is electrodiagnostic evidence of  left cervical motor radiculopathy in a C5/ C6 distribution. The findings were chronic in nature in the extremity. There was acute denervation seen only in the cervical paraspinals in the mid and lower region.   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   There is NO electrodiagnostic evidence of left ulnar neuropathy at the elbow. "    Current Outpatient Medications   Medication Sig Dispense Refill    Acetaminophen 500 MG Oral Tab Take 3 tablets (1,500 mg) by mouth 2 times a day.      albuterol HFA (ProAir HFA) 108 (90 Base) MCG/ACT inhaler Inhale 2 puffs by mouth every 4 hours as needed for shortness of breath/wheezing for up to 10 days. Dispense with spacer 18 g 0    Alpha-Lipoic Acid 300 MG tablet Take 300 mg by mouth 2 times a day.      amLODIPine 5 MG  tablet Take 1 tablet (5 mg) by mouth 2 times a day. 180 tablet 1    atorvastatin 10 MG tablet Take 1 tablet (10 mg) by mouth daily. 90 tablet 0    BENFOTIAMINE OR Take 250 mg by mouth every morning.      betamethasone dipropionate 0.05 % lotion APPLY TO SCALP AND EARS TOPICALLY 2 TIMES A DAY AS NEEDED FOR ITCHING. Notify MD of any signs of infection or skin thinning 60 mL 5    Cholecalciferol (VITAMIN D3) 2000 units Oral Cap Take 1 capsule (2,000 Units) by mouth daily. For low level 1 capsule 1    clindamycin 1 % gel Apply 1 Application  topically 2 times a day as needed. Apply to back.      cyanocobalamin (Vitamin B-12) 1000 MCG tablet Take 1 tablet (1,000 mcg) by mouth daily.      diclofenac 1 % gel Apply 4 g topically 4 times a day. Apply to 2 gram four times daily to neck, trapezius muscle, and low back. Use a max of 32 grams a day. (Patient taking differently: Apply 2-4 g topically 4 times a day as needed (pain).) 300 g 4    DULoxetine 60 MG DR capsule Take 1 capsule (60 mg) by mouth 2 times a day. 180 capsule 1    EPINEPHrine 0.3 MG/0.3ML auto-injector Inject one pen (0.3mg /mL) into the muscle for severe allergic reaction. Call 911. Use second pen if symptoms continue 2 each 0    ibuprofen 200 MG tablet Take 2 tablets (400 mg) by mouth every 8 hours as needed for pain.      lidocaine 5 % patch Apply 1 patch onto the skin daily as needed. Apply to painful area for up to 12 hours in a 24 hour period. 30 patch 3    lisinopril 20 MG tablet Take 1 tablet (20 mg) by mouth 2 times a day. 180 tablet 3    MAGNESIUM OR Take 2 tablets by mouth at bedtime.      mirabegron ER (Myrbetriq) 50 MG 24 hr tablet Take 1 tablet (50 mg) by mouth daily. 100 tablet 3    multivitamin with minerals tablet Take 1 tablet by mouth daily.      naltrexone 50 MG tablet TAKE 1 TABLET BY MOUTH DAILY WITH FOOD 30 tablet 11    Omega-3 Fatty Acids (OMEGA 3 OR) Take 1 tablet by mouth every morning.      pregabalin 300 MG capsule Take 1 capsule  (300 mg) by mouth 2 times  a day. 180 capsule 0    primidone 50 MG tablet Take 1 tablet (50 mg) by mouth 2 times a day. 180 tablet 4     No current facility-administered medications for this visit.     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    Idiopathic peripheral neuropathy 03/15/2015    Spondylosis of cervical region without myelopathy or radiculopathy 05/28/2014    Back pain, lumbosacral 02/15/2013    B12 deficiency 12/21/2012    Borderline low 5 /21/2014    Chronic renal insufficiency, stage III (moderate) (HCC) 12/21/2012    5/ 2013    Hyperlipidemia 09/26/2012    Cervicalgia 03/11/2010    Radiofrequency ablation of c3 to c 7    Allergic rhinitis due to allergen     Carotid Sinus Hypersensitivity     Chronic pain     chronic LUE pain had injection.    Disc disorder of lumbosacral region     Hip injury     HYPERTENSION      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[facility])      Past Surgical History:   Procedure Laterality Date    L meniciscus Left 07/2017    by Dr Marcelle Overlie , re did left menisicus     PR ANES; COLONOSCOPY  2002    repeat in 3 years    PR ANES; COLONOSCOPY & POLYPECTOMY  11/18/2007    repeat in 3 years    PR CORRECTION HAMMERTOE Right 05/13/2022    Dr. Hilarie Fredrickson, DPM    PR HALLUX RIGIDUS W/CHEILECTOMY 1ST MP JT W/O IMPLT Right 10/21/2017    Dr. Donn Pierini    PR UNLISTED PROCEDURE FEMUR/KNEE      PR UNLISTED PROCEDURE HANDS/FINGERS      PR UNLISTED PROCEDURE SPINE  2013    rfa to c3 to c 7     SURGICAL HX OTHER Right 12/03/2020     RIGHT foot WOUND INCISION AND DRAINAGE    TOE SURGERY Right 02/09/2019    toe surgery  Right 01/12/2019    Dr Hilarie Fredrickson      Family History       Problem (# of Occurrences) Relation (Name,Age of Onset)    Heart (other) (1) Mother: MI    Other Family Hx (1) Other: myelodysplasia    Colon Cancer (1) Father    Suicide Attempt (1) Brother: died of suicide           Negative family history of: Cancer          Social History     Social History Narrative    09/06/18- married 41 years, 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  Non-smoker, 14 or more drinks/week almost exclusively wine with dinner.      2024 still retired             Past medical, surgical, family, and social history as above was reviewed and updated personally with Mr. Ishman.  Medications and allergies were also  reviewed and confirmed personally.    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, N13.8] 07/17/2022    Urge incontinence [N39.41] 07/17/2022    Mallet toe, right [M20.5X1] 04/23/2022     Added automatically from request for surgery [id number]      Chronic bilateral low back pain without sciatica [M54.50, G89.29] 04/29/2021    Psychological factors affecting medical condition [F54] 04/24/2021    Macrocytosis without anaemia [D75.89] 02/12/2020    Myofascial pain [M79.18] 10/20/2019    MRSA (methicillin resistant staph aureus) culture positive [Z22.322] 07/13/2019    Closed fracture of one rib of right side [S22.31XA] 01/26/2019     See x ray 01/26/2019   Right anterior lat sub acute chronic       Enuresis [R32] 09/06/2018    Chronic left-sided low back pain without sciatica [M54.50, G89.29]  06/21/2018    Acute left-sided low back pain without sciatica [M54.50] 03/14/2018    Greater trochanteric pain syndrome of right lower extremity [M25.551] 11/25/2017    Complex tear of medial meniscus of left knee as current injury [S83.232A] 09/08/2017    Primary osteoarthritis of left knee [M17.12] 09/08/2017    Right lumbar radiculitis [M54.16] 05/05/2017    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, 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] 04/13/2017    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] 01/11/2017     Osteoarthritis of right hip x ray report - moderate to advanced radiology read 01/11/2017      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79] 12/16/2015     [place] 4/29- 12/10/2015  Non-traumatic intraparenchymal hemorrhage, hypertensive of the left thalamus   Traumatic hemorrhage   -- falcine subdural hemorrhage, small cortical subarachnoid      Alcohol abuse [F10.10] 12/16/2015    Cerebral ventriculomegaly [G93.89] 10/09/2015    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    History of alcohol dependence (HCC) [F10.21] 06/26/2015    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) [G37.9] 05/07/2015    Idiopathic peripheral neuropathy [G60.9] 03/15/2015    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] 05/28/2014    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] 01/18/2014    Tremor [R25.1] 04/20/2013    Fall [W19.XXXA] 02/23/2013      BP < 100  Occasioanal tri[p and leg weakness      Back pain, lumbosacral [M54.50] 02/15/2013    Lumbar radiculopathy, chronic [M54.16] 02/15/2013    Neck pain [M54.2] 02/15/2013    Chronic pain [G89.29] 12/21/2012     Now at [facility], DO, Santiago Glad    Neck pain -  burn c3-7 on left side per patient  Some trigger injection    Lumbar pain-since 80's better with exercise      Bee sting allergy [Z91.030] 12/21/2012     hives      Numbness and tingling of leg [R20.0, R20.2] 12/21/2012     Left calf -left foot long term suspect from back-suspect L 45      Chronic back pain [M54.9, G89.29] 12/21/2012     Mri in mid scape 1/214  Multilevel degenerative disc disease of the lumbar spine. Circumferential disc bulge at all levels below and including L1-2. No significant central spinal canal stenosis or neuroforaminal narrowing.  2. Mild retrolisthesis of L5 on S1.            B12 deficiency [E53.8] 12/21/2012     Borderline low 5 /21/2014      Chronic renal insufficiency, stage III (moderate) (HCC) [N18.30] 12/21/2012     5/ 2013      Hyperlipidemia [E78.5] 09/26/2012    Cervicalgia [M54.2] 03/11/2010     Radiofrequency ablation of c3 to c 7         [place] Prescription Monitoring Program Review:   [place] Medication Dispense History (from 04/12/2022 to 04/07/2023)     Dispensed Days Supply Quantity   PREGABALIN 300 MG CAPSULE 02/27/2023 90 180 unspecified   PREGABALIN 300 MG CAPSULE 12/01/2022 90 180 unspecified   PREGABALIN 300 MG CAPSULE 08/25/2022 90 180 unspecified   DIAZEPAM 2 MG TABLET 07/19/2022 1 2 unspecified   PREGABALIN 300 MG CAPSULE 05/26/2022 90 180 unspecified   OXYCODONE HCL (IR) 5 MG TABLET 04/30/2022 7 30 unspecified     REVIEW OF SYSTEMS:  The Review of Systems was provided by Mr. Vallez and entered in the [place] note associated with this encounter.  I personally reviewed and confirmed the ROS information with Mr. Schanbacher and have the following additional comments:  none.    PHYSICAL EXAMINATION:  BP  (!) 151/88   Pulse 86   Temp 36.1 C (Temporal)   Resp 16   SpO2 95%     General: Well-Developed and Well Nourished. They are alert and oriented.    Psych:  No acute distress. Mood euthymic. Normal affect.    HEENT: Normal cephalic, atraumatic. Non-interic sclera. Moist mucous membranes     Resp:  Non-labored breathing    CV: No cyanosis, clubbing or edema. Intact peripheral pulses.    Skin: No atrophy, rashes or open wounds involving the areas examined.   Strength is 5/5 in upper extremities bilaterally including SAB, EF, WE, EE, FF, FDI, APB.  Gait: Non-antalgic, equal stride length, non-ataxic.  Inspection: protracted shoulders  Cervical ROM: very limited neck extension which precipitates usual neck pain into supraspinatus, trapezius, rhomboids  Palpation: moderate TTP in lower cervical paraspinals and trapezius b/l    Visit Specific Diagnoses:  (Z61.096) Arthropathy of cervical facet joint  (M54.2) Cervicalgia  (E45.409) Cervical facet joint syndrome    IMPRESSION/DISCUSSION:   Lenford Addy is a 72 year old male with chronic axial neck pain who is possibly a C4-6 ACDF candidate in future.  From exam, his cervical facet referral pattern is most consistent with C4-5 and C5-6 cervical facet disease in the context of L4 on L5 antherolisthesis.  We also reviewed his 2023 cervical MRI which shows evidence of cervical stenosis as well as facet arthropathy.  We therefore we will move forward with bilateral C4-5 and C5-6 medial branch blocks rather than C5-6 and C6-7 blocks.  We also discussed with him that attempting C7 MBB level would be technically challenging due to his protracted shoulders. He was in agreement with plan. Please see same day procedure note.    We discussed our impressions and the following suggestions/options in detail with Mr. Partsch and  provided him with additional information in the after visit summary.  Mr. Cinnamon had no further questions.    Recommendations:  Imaging: reviewed cervical  MRI with him    Rehabilitation: Recommend that they remain active using pain as guide with goals to maintain activities that provide quality-of-life    Interventions: plan for b/l C4-5 and C5-6 MBBs today    Follow-up with Dr. Araceli Bouche in mid-September; if MBBs helpful, will repeat to see if he is RFA candidate    Coordination of care:    We suggested that Mr. Maisie Fus discuss our consultation findings with his  PCP, Macario Carls, MD and other involved health care providers.    Rosalyn Gess, DO PGY-5  Pain Medicine    FOR PATIENTS READING THEIR MEDICAL RECORD NUMBER The primary purpose of this note is documentation of information for your care team that facilitates a comprehensive understanding of your health. It may include non-standard sentence structure, medical abbreviations, and inherently personal aspects of your social history and lifestyle. Please be advised that this information is confidential, protected, and used only to better serve your needs as our patient.    DIAGNOSTIC NERVE BLOCK OF THE CERVICAL FACET JOINTS    PROCEDURE  Nerve block of the facet joint C4-5 and C5-6; side: bilateral.    PRE-PROCEDURE DIAGNOSIS  (N56.213) Arthropathy of cervical facet joint  (M54.2) Cervicalgia  (M47.812) Cervical facet joint syndrome.    POST-PROCEDURE DIAGNOSIS  (M47.812) Arthropathy of cervical facet joint  (M54.2) Cervicalgia  (M47.812) Cervical facet joint syndrome.    INDICATION FOR INJECTION  Chronic neck pain of suspected facet origin; we would like to find out whether the facet joints are a main source of pain.    Mr. Schartner has chronic, ongoing axial spine pain, the goal of a medial branch block is to determine if the facet joints are the primary source of his pain.  For Mr. Helfer, the procedure is indicated for the following reasons: 1) moderate to severe pain has been present for more than 3 months with functional impairment; 2) he did not respond to conservative treatment efforts including physical therapy  and medications for longer than 3 months; 3) Mr. Lanyon pain is characteristic of facet pain and is primarily axial in nature without clear radicular symptoms or other neurological components; and 4) his imaging does not reveal another source of pain     CONSENT:    Potential risks including pain, serious infection, paralysis, seizure, vessel or nerve injury, spinal anesthesia, allergic reaction, hypertensive crisis, vasovagal reaction and others were discussed with the patient. The patient was given and read a patient information sheet regarding injection procedures prior to today's injection. The patient understands the benefits, risks, and alternatives of the procedure and agrees to the procedure today.  Written informed consent was obtained.     ATTENDING PHYSICIAN  Wendi Maya, MD.    FELLOW / RESIDENT  Rosalyn Gess, DO    SEDATION  None.    NPO STATUS  None.    INTRAVENOUS LINE  No.    DESCRIPTION OF PROCEDURE  Position: Lateral.  Imaging technique: Fluoroscopy.  Monitoring: None.  Time-out: Performed.  Preparation: The injection area was prepped and draped  in a sterile fashion.  Imaging for needle placement: Lateral fluoroscopic image to superimpose the right with the left facet joints.  Needle used: 25G, 2 inch.  Needle insertion: Under fluoroscopic tunnel view.  Imaging conditions: Good visualization of the target bony structures.    Injection of the C4 medial branch  Injection of 0.25 ml of Omnipaque 300 at the middle of the articular process, which confirmed adequate spread. Then injection of 0.3 ml of bupivacaine 0.5%. Uneventful removal of the needle. This was performed on right and left.    Injection of the C5 medial branch  Injection of 0.25 ml of Omnipaque 300 at the middle of the articular process, which confirmed adequate spread. Then injection of 0.3 ml of bupivacaine 0.5%. Uneventful removal of the needle. This was performed on right and left.    Injection of the C6 medial branch  Injection of  0.25 ml of Omnipaque 300 at the middle of the articular process, which confirmed adequate spread. Then injection of 0.3 ml of bupivacaine 0.5%. Uneventful removal of the needle. This was performed on right and left.          POST-PROCEDURE  Mr. Dearmon was transported to the recovery area in stable condition for observation.  On re-examination more than 15 minutes after the procedure:  Mr. Frieders did note reduction in pain.  Mr. Jaecks did demonstrate improvement in range of motion.   Mr. Strenger had no evidence of motor block.  The pain score was  10/10 prior to the procedure and 4/10 after the procedure (NRS 0-10, with 0 = no pain, 10 = worst pain imaginable).    INTERPRETATION  The 6h pain recording will be completed in order to fully evaluate the result of this diagnostic procedure.      COMPLICATIONS  NONE.    PLAN  The pain intensity will be monitored by 6h pain diary.  Subsequent care is dependent on the results of this procedure.  He will follow up in October for repeat MBB if first is helpful.    Rosalyn Gess MD  Chronic pain management fellow    I was present for the entire procedure (Nerve block of the facet joint C4-5 and C5-6; side: bilateral.) which was performed under my direct personal supervision and all key elements of this visit.   I reviewed the documentation of the other providers and concur with Dr. Glorianne Manchester findings. I edited the procedure note.  Imaging guidance was used for this procedure and representative images were saved.     Wendi Maya, MD    Interventional Pain Specialist  [facility]    Notation: This note has been generated with a voice recognition dictation software and electronically signed.

## 2023-04-07 ENCOUNTER — Ambulatory Visit (HOSPITAL_BASED_OUTPATIENT_CLINIC_OR_DEPARTMENT_OTHER): Payer: Medicare PPO | Admitting: Anesthesiology

## 2023-04-07 ENCOUNTER — Ambulatory Visit: Payer: Medicare PPO | Attending: Anesthesiology | Admitting: Anesthesiology

## 2023-04-07 DIAGNOSIS — M47812 Spondylosis without myelopathy or radiculopathy, cervical region: Secondary | ICD-10-CM

## 2023-04-07 DIAGNOSIS — M542 Cervicalgia: Secondary | ICD-10-CM | POA: Insufficient documentation

## 2023-04-07 NOTE — Progress Notes (Signed)
Pain score prior to procedure:  10/10  Pain score after procedure:  4/10    Ronnie Mason met discharge criteria:  A/OX4/baseline, VSS/returned to baseline. Mobility returned to baseline prior to discharge. Procedure site clean dry and intact.  Written and verbal discharge instructions, including emergency contact phone numbers, reviewed with Ronnie Mason. Verbal understanding obtained from him.  Ronnie Mason dressed self without nursing assistance. He was discharged in stable condition from procedure area with steady gait. to home with all belongings and with ride/responsible person.

## 2023-04-07 NOTE — Patient Instructions (Signed)
[facility]  Post-Procedure Diagnostic Block Instructions with Pain Diary    Post-procedure care:  Today, you underwent a procedure that may or may not treat your pain, but should help [place] identify the source of your pain.  Continue taking your scheduled medications.  If you experience increasing pain, take your regular short-acting medications.    Site care:  You may remove the band-aid after a few hours.  You may shower today. No swimming, tub baths, or hot tubs for 24 hours following your procedure.    Activity recommended during the 1st 6 hours after the procedure:  Continue normal activities, including those that cause the pain.     Do not lie down and rest after the procedure.    Do not drive or operate machinery for 24 hours after your procedure if you were given sedation    Medications:  If you stopped taking any blood thinning medications such as Coumadin or Plavix, you may resume these tomorrow unless specified differently by the prescribing physician.    Follow-up:   You are being sent home with the Patient Self Administered 6 Hour Pain Diary to record your pain scores during the 1st 6 hours after your procedure.  After you have completed your 6 hour pain diary, find the time when you had the lowest pain score meaning the least amount of pain.  Then answer "When I had the lowest pain score= least amount of pain, my pain was relieved by _____%".    If you have pain relief longer than 6 hours after your procedure, please make note of this on the diary every 4-5 hours until the pain returns.  You do not need to stay up and active during this time.     Call us if you develop any of the following symptoms in the next 7 days:  Fever above 100 degrees F     Any unusual increase in your level of pain  Swelling, bleeding, redness, or increased tenderness at the procedure or IV site    Contact us:  Anytime, 24 hours/day, 7 days/week at [phone], option 2, option 2.        Patient Self-Administered 6  hour Pain Diary    1.  For accuracy, keep this diary with you during the 6 hour period so you can write down your pain scores as you go, at the time noted on your diary.  During the 6 hours, please your pain scores while doing activities that provoke your pain as well as when you are not doing those activities.      2.  Within 24 hours of completing this pain score diary, please send [place] your pain score diary either by:  1)  Scanning into eCare  2)  Faxing to [phone] or 3)  Calling the pain score voicemail line at [phone].  Leave the spelling of your first and last name,  U#, date of birth, date of service and the 20 numbers you wrote below, including pre-procedure, immediately post procedure and % pain relief.  After that, you may leave any comments regarding your post block experience that may be helpful (e.g. was able to walk, much worse, had to take medications).      Ronnie Mason   Procedure Date: 04/07/23  Procedure Name: C5-C6 and C6-C7 MBB  Procedure Attending: Dorna Bloom    Use this scale to rate your pain  0 1 2 3 4 5 6 7 8 9  10  No pain  worst pain     TIME PAIN SCORE WITH PAINFUL ACTIVITY PAIN SCORE WITHOUT PAINFUL ACTIVITY   Before injections:       Immediately after injections:       30 minutes after injections:       1 hour after injections:       2 hours after injections:       3 hours after injections:       4 hours after injections:       5 hours after injections:       6 hours after injections:       When I had the lowest pain score, my % (percentage) pain relief:          IT IS ESSENTIAL YOU CALL IN YOUR PAIN DIARY RESULTS BEFORE ANY FURTHER PROCEDURE(S) CAN BE SCHED

## 2023-04-07 NOTE — Progress Notes (Signed)
[name] identified by name and DOB.  He confirmed he is having a C5-C6 and C6-C7 MBB.  He was admitted to procedure area with steady gait.  He has not fallen in the last 6 months.  Fall prevention interventions: Patient provided with non-skid stockings  He denies taking any blood thinners.  He denies having a bleeding disorder.  He has held his medications per provider instructions.  He has not been on antibiotics for the past two weeks.  He denies a history of fainting during medical procedures.   He has not been NPO.  He undressed self without nursing assistance.  Is He receiving a steroid injection today? No  I verbally reviewed written discharge instructions and pain diary with him.   Signed consent for series not older than 90 days is scanned in the patient's chart: No  Confirmed ride home with his wife, Ronnie Mason at bedside.     He has a pre-procedure pain score of 10 neck.    TIME Pain Score at rest: Pain Score:  Bend forward   Pain Score:   Bend backward   Pain Score:  Neck Bend;twist right Pain Score:  Neck Bend; twist left     Pre-Procedure 7 2 10 10  10

## 2023-04-09 ENCOUNTER — Ambulatory Visit (INDEPENDENT_AMBULATORY_CARE_PROVIDER_SITE_OTHER): Payer: Medicare PPO | Admitting: Acupuncturist

## 2023-04-12 ENCOUNTER — Telehealth (INDEPENDENT_AMBULATORY_CARE_PROVIDER_SITE_OTHER): Payer: Self-pay | Admitting: Acupuncturist

## 2023-04-12 NOTE — Telephone Encounter (Signed)
Dr. Fredric Mare will be unavailable on Friday, 09/10/23 for training, so I called pt's preferred phone number and informed this information to pt. Pt rescheduled her appointment from 09/10/23 @ 11 am to 09/09/23 @ 2 pm. Pt requested to receive updated appointments lists, so I sent it via Mychart message.

## 2023-04-13 ENCOUNTER — Telehealth (HOSPITAL_BASED_OUTPATIENT_CLINIC_OR_DEPARTMENT_OTHER): Payer: Self-pay | Admitting: Anesthesiology

## 2023-04-13 NOTE — Telephone Encounter (Addendum)
Per request of Dr.Katherin [name], RN messages pt via MyChart with the following information:        Ronnie Gustin, RN  [facility]  04/13/2023 in [facility]

## 2023-04-15 ENCOUNTER — Telehealth (INDEPENDENT_AMBULATORY_CARE_PROVIDER_SITE_OTHER): Payer: Self-pay | Admitting: Acupuncturist

## 2023-04-15 NOTE — Telephone Encounter (Signed)
Sent Mychart message to pt.

## 2023-04-18 ENCOUNTER — Other Ambulatory Visit (INDEPENDENT_AMBULATORY_CARE_PROVIDER_SITE_OTHER): Payer: Self-pay | Admitting: Internal Medicine

## 2023-04-18 DIAGNOSIS — I10 Essential (primary) hypertension: Secondary | ICD-10-CM

## 2023-04-20 MED ORDER — LISINOPRIL 20 MG OR TABS
20.0000 mg | ORAL_TABLET | Freq: Two times a day (BID) | ORAL | 0 refills | Status: DC
Start: 2023-04-20 — End: 2023-07-29

## 2023-04-20 NOTE — Telephone Encounter (Signed)
[place] 05/10/23 for TOC

## 2023-04-22 ENCOUNTER — Ambulatory Visit: Payer: Medicare PPO | Attending: Anesthesiology | Admitting: Anesthesiology

## 2023-04-22 VITALS — BP 113/95 | HR 85 | Temp 97.8°F | Resp 16

## 2023-04-22 DIAGNOSIS — M5417 Radiculopathy, lumbosacral region: Secondary | ICD-10-CM | POA: Insufficient documentation

## 2023-04-22 MED ORDER — IOHEXOL 240 MG/ML IJ SOLN
2.0000 mL | Freq: Once | INTRAMUSCULAR | Status: AC
Start: 2023-04-22 — End: 2023-04-22
  Administered 2023-04-22: 2 mL via EPIDURAL

## 2023-04-22 MED ORDER — DEXAMETHASONE SOD PHOSPHATE PF 10 MG/ML IJ SOLN
INTRAMUSCULAR | Status: AC
Start: 2023-04-22 — End: 2023-04-22
  Filled 2023-04-22: qty 1

## 2023-04-22 MED ORDER — DEXAMETHASONE SODIUM PHOSPHATE 10 MG/ML IJ SOLN
10.0000 mg | Freq: Once | INTRAMUSCULAR | Status: AC
Start: 2023-04-22 — End: 2023-04-22
  Administered 2023-04-22: 10 mg

## 2023-04-22 NOTE — Progress Notes (Signed)
PROCEDURE NOTE       Lumbosacral Interlaminar Epidural Steroid Injection    PROCEDURE  Interlaminar epidural steroid injection midline; L1-2.    PRE-PROCEDURE DIAGNOSIS  (M54.17) Lumbosacral radiculopathy  (primary encounter diagnosis)    POST-PROCEDURE DIAGNOSIS  (M54.17) Lumbosacral radiculopathy  (primary encounter diagnosis)     ATTENDING PHYSICIAN  [name], MD    FELLOW / RESIDENT  Andree Moro, MD    SEDATION  No    NPO STATUS  No    INTRAVENOUS LINE  No    OPERATIVE PROCEDURE  Prior to the procedure a final verification was performed.   Ronnie Mason  was brought to the procedure room and placed on the exam table in a comfortable prone position. The sterile field was prepped in a sterile fashion with Chloroprep and sterile drapes were placed.   The target interlaminar space was visualized by anteroposterior fluoroscopic image. Local anesthesia was provided by infiltration of  1% lidocaine. An 18-gauge 3.5 inch Touhy needle was placed below the superior laminar edge. Using fluoroscopic guidance, the needle was advanced into the intralaminar window under multiplanar fluoroscopic control to prevent excessively deep insertion of the needle.  After engagement of the ligamentum flavum, the loss of resistance to saline technique was used to identify the epidural space. Attempted aspiration yielded no blood or cerebrospinal fluid. Subsequently, Omnipaque-240 was injected. Fluoroscopy showed the typical pattern of the epidural space and no vascular uptake. Lateral view confirmed proper epidural spread. A versa-cath was attempted to be inserted but did not easily leave end of needle so was aborted for concern of dural puncture.  Injectate:  A total of dexamethasone 10 mg (1 ml) and 8ml of saline were injected into the epidural space. The needle was then withdrawn uneventfully.    POST-PROCEDURE  Ronnie Mason  was transported to the recovery room for observation. Where he  made an uneventful recovery.  [name] was given detailed discharge instructions.     COMPLICATIONS  None.     PLAN  Pain diary.  Subsequent care will depend on the results of today's procedure.   Follow up with Dr. Araceli Bouche.    Andree Moro MD, MPH  Fellow Physician  [place] Department of Anesthesiology and Pain Medicine  [facility]

## 2023-04-22 NOTE — Progress Notes (Signed)
Pain score prior to procedure:  3-4/10, 7-8/10 with activity.   Pain score after procedure:  0/10    [name] met discharge criteria:  A/OX4/baseline, VSS/returned to baseline. Mobility returned to baseline prior to discharge. Procedure site clean dry and intact.  Written and verbal discharge instructions, including emergency contact phone numbers, reviewed with Ronnie Mason. Verbal understanding obtained from him.  [name] dressed self without nursing assistance. He was discharged in stable condition from procedure area with steady gait. to home with all belongings and with ride/responsible person.

## 2023-04-22 NOTE — Patient Instructions (Signed)
[facility]  Post-Procedure Discharge Instructions      After the procedure:  Immediate and complete pain relief is rare  Numbness and/or weakness in the area of your body supplied by the injected nerve; these symptoms should resolve but may last up to several hours  Some soreness and bruising at the injection site(s)    Activities:  If you have any weakness or numbness caused by the injection, DO NOT DRIVE or operate machinery and limit other activity until sensation returns to normal.  You may resume regular exercises/activities as tolerated.  If you received sedation, DO NOT DRIVE or operate machinery for 24 hours.    Medications:  If you stopped taking any blood thinning medications such as Coumadin or Plavix, you may resume these tomorrow unless specified differently by the prescribing physician.    Site care:  You may remove the band-aid after 6 hours.  You may shower today. No swimming, tub baths or hot tubs for 24 hours following your procedure.  For the first 48 hours, apply ice packs to the injection site for 15-20 minutes hourly as needed for comfort.  Wrap a light towel or cloth around ice packs and heating pad to protect the skin.  After 48 hours, use a warm heating pad to the injection site for 15-20 minutes hourly as needed for comfort.    If you received steroids today:  Steroid medications may cause facial flushing, occasional low-grade fevers, hiccups, insomnia, headaches, water retention, increased appetite, increased heart rate, and abdominal cramping or bloating. These side effects occur in only about 5 percent of patients and commonly disappear within one to three days after the injection.  If you are diabetic check your blood sugar more frequently than usual as you may develop an increase in blood sugar for the next 10-14 days. Contact your diabetes physician if this occurs.    Call us if you develop any of the following symptoms in the next 7 days:  Fever above 100 degrees  F     Any unusual increase in your level of pain  Swelling, bleeding, redness, or increased tenderness at the procedure or IV site  Headache not relieved by Tylenol (if you had an epidural steroid injection)  .    Contact us:  Anytime, 24 hours/day, 7 days/week at [phone], option 2, option 2.          [facility]  Patient Self-Administered Pain Diary  1 month        The procedure you just had was done in hopes of providing long term pain relief. Depending on the type of procedure you had, you will need to answer the questions below 1 month after your procedure.  Accurate completion and timely reporting of your pain diary(s) enables your Pain MD to review the results and make further treatment recommendations.    1 month after your procedure, write your answers to the 4 questions below and send your pain score diary to [place] either by:      1)  Sending a picture via eCare    2)  Faxing to [phone]  3)  Calling the pain score voicemail line at [phone].  Leave the spelling of your first and last name, U#, date of birth and date of procedure and the answers to all of the 4 questions below.             Use this scale to rate your pain  0 1 2 3  4 5 6 7 8 9 10   No pain                      worst pain      Ronnie Mason   Procedure Date: 04/22/23  Procedure Name: Caudal epidural steroid injection  Procedure Attending: Dr Araceli Bouche    1.  Pain score immediately before procedure: __________    2. 1 month after your procedure, do you feel better?  YES/NO    3.  If you answered YES to the above, by what percentage__________% (0-100%) has your pain been relieved.    4.  Pain Score now: _________ (at time of telephone call)         [facility]  Patient Self-Administered Pain Diary  3 months        The procedure you just had was done in hopes of providing long term pain relief. Depending on the type of procedure you had, you will need to answer the questions below 3 months after your procedure.   Accurate completion and timely reporting of your pain diary(s) enables your Pain MD to review the results and make further treatment recommendations.    3 months after your procedure, write your answers to the 4 questions below and send your pain score diary to [place] either by:      1)  Sending a picture via eCare    2)  Faxing to [phone]  3)  Calling the pain score voicemail line at [phone].  Leave the spelling of your first and last name, U#, date of birth and date of procedure and the answers to all of the 4 questions below.             Use this scale to rate your pain  0 1 2 3 4 5 6 7 8 9  10  No pain                      worst pain      Ronnie Mason   Procedure Date: 04/22/23  Procedure Name: Caudal epidural steroid injection  Procedure Attending: Dr Araceli Bouche    1.  Pain score immediately before procedure: __________    2. 3 months after your procedure, do you feel better?  YES/NO    3.  If you answered YES to the above, by what percentage__________% (0-100%) has your pain been relieved.    4.  Pain Score now: _________ (at time of telephone call)

## 2023-04-22 NOTE — Progress Notes (Signed)
Ronnie Mason identified by name and DOB.   He confirmed he is having a Caudal epidural steroid injection  He was admitted to procedure area with steady gait.  He has not fallen in the last 6 months.  Fall prevention interventions: Patient provided with non-skid stockings  He denies taking any blood thinners.  He denies having a bleeding disorder.  He has held his medications per provider instructions.  He has not been on antibiotics for the past two weeks.   He denies a history of fainting during medical procedures.   He has not been NPO.  He undressed self without nursing assistance.  Is He receiving a steroid injection today? Yes  If yes, has He received the COVID-19 vaccine (either brand) within the past 2 weeks? No  OR does He plan to receive the COVID-19 vaccine (either brand) within the next 2 weeks?   I verbally reviewed written discharge instructions and pain diary with him.   Signed consent for series not older than 90 days is scanned in the patient's chart:  Yes    Confirmed ride home with his Ronnie Mason in waiting room  He has a pre-procedure pain score of 3-4/10 in lower back, 7-8/10 with activity.

## 2023-04-22 NOTE — Progress Notes (Signed)
I was present for the entire procedure (LESI) which was performed under my direct personal supervision.   I reviewed the documentation of the other providers and concur with Dr. Deanna Artis 's findings. I edited the procedure note.      Please see progress note from 04/01/23 regarding rationale for higher level. Upon fluoroscopic imaging the most safe/open level was L2-3 prompting attempt at that level.    [name], MD  Medical Director   [facility]  [facility]   Department of Anesthesiology and Pain Medicine

## 2023-04-23 ENCOUNTER — Ambulatory Visit (INDEPENDENT_AMBULATORY_CARE_PROVIDER_SITE_OTHER): Payer: Medicare PPO | Admitting: Acupuncturist

## 2023-04-23 ENCOUNTER — Telehealth (INDEPENDENT_AMBULATORY_CARE_PROVIDER_SITE_OTHER): Payer: Self-pay | Admitting: Acupuncturist

## 2023-04-23 DIAGNOSIS — M5459 Other low back pain: Secondary | ICD-10-CM

## 2023-04-23 DIAGNOSIS — M545 Other chronic pain: Secondary | ICD-10-CM

## 2023-04-23 DIAGNOSIS — G8929 Other chronic pain: Secondary | ICD-10-CM

## 2023-04-23 NOTE — Telephone Encounter (Signed)
Dr. Fredric Mare has a cancellation today, 04/23/23 @ 1;30 pm, so I called pt's preferred phone number and spoke to pt. Pt was happy to schedule his acupuncture appointment today, @ 1:30 pm.

## 2023-04-23 NOTE — Progress Notes (Signed)
[place] [facility]    Acupuncture Follow-Up Visit  Referred by: Drexel Iha, MD     PCP: [name], MD    Primary Concern: No chief complaint on file.      CC1: Chronic Lower back pain (Context from previous visit)  - Location: Bilateral lower back  - Onset; Chronic      Other related MSK issues  Neck and shoulder tension     Since the last visit  - Just had a procedure (Interlaminar epidural steroid injection midline; L1-2) yesterday at the back, and feeling much better today.   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 Will be start doing PT to improving the foot balance.      Related issue MSK: Neck pain   - Pain scale 3/10  - rest of foot during the sleep. Julien note that during the night, his feet moves constantly.     Medications:  Outpatient Medications Prior to Visit   Medication Sig Dispense Refill    Acetaminophen 500 MG Oral Tab Take 3 tablets (1,500 mg) by mouth 2 times a day.      Alpha-Lipoic Acid 300 MG tablet Take 300 mg by mouth 2 times a day.      amLODIPine 5 MG tablet Take 1 tablet (5 mg) by mouth 2 times a day. 180 tablet 1    atorvastatin 10 MG tablet Take 1 tablet (10 mg) by mouth daily. 90 tablet 0    BENFOTIAMINE OR Take 250 mg by mouth every morning.      betamethasone dipropionate 0.05 % lotion APPLY TO SCALP AND EARS TOPICALLY 2 TIMES A DAY AS NEEDED FOR ITCHING. Notify MD of any signs of infection or skin thinning 60 mL 5    Cholecalciferol (VITAMIN D3) 2000 units Oral Cap Take 1 capsule (2,000 Units) by mouth daily. For low level 1 capsule 1    clindamycin 1 % gel Apply 1 Application  topically 2 times a day as needed. Apply to back.      cyanocobalamin (Vitamin B-12) 1000  MCG tablet Take 1 tablet (1,000 mcg) by mouth daily.      diclofenac 1 % gel Apply 4 g topically 4 times a day. Apply to 2 gram four times daily to neck, trapezius muscle, and low back. Use a max of 32 grams a day. (Patient not taking: Reported on 04/22/2023) 300 g 4    DULoxetine 60 MG DR capsule Take 1 capsule (60 mg) by mouth 2 times a day. 180 capsule 1    EPINEPHrine 0.3 MG/0.3ML auto-injector Inject one pen (0.3mg /mL) into the muscle for severe allergic reaction. Call 911. Use second pen if symptoms continue 2 each 0    ibuprofen 200 MG tablet Take 2 tablets (400 mg) by mouth every 8 hours as needed for pain. (Patient not taking: Reported on 04/22/2023)      lidocaine 5 % patch Apply 1 patch onto the skin daily as needed. Apply to painful area for up to 12 hours in a 24 hour period. 30 patch 3    lisinopril 20 MG tablet Take 1 tablet (20 mg) by mouth 2 times a day. 200 tablet  0    MAGNESIUM OR Take 2 tablets by mouth at bedtime.      mirabegron ER (Myrbetriq) 50 MG 24 hr tablet Take 1 tablet (50 mg) by mouth daily. 100 tablet 3    multivitamin with minerals tablet Take 1 tablet by mouth daily.      naltrexone 50 MG tablet TAKE 1 TABLET BY MOUTH DAILY WITH FOOD 30 tablet 11    Omega-3 Fatty Acids (OMEGA 3 OR) Take 1 tablet by mouth every morning.      pregabalin 300 MG capsule Take 1 capsule (300 mg) by mouth 2 times a day. 180 capsule 0    primidone 50 MG tablet Take 1 tablet (50 mg) by mouth 2 times a day. 180 tablet 4     No facility-administered medications prior to visit.        Allergies:  Review of patient's allergies indicates:  Allergies   Allergen Reactions    Adhesives Skin: Rash     Glue on lido patches    Bee Venom Skin: Hives, Skin: Itching and Swelling    Gabapentin Other     Flu like symptoms, diarrhea, body ache upset stomach    Methocarbamol Other     Patient's wife reports cognitive difficulties ("goofy")          Objective:  Pulse  - Soft  Tongue - Deferred.     Assessment/Plan:    Jeanette  is  following up with acupuncture and [place] medicine for consultation and treatment of:    (M54.50,  G89.29) Chronic bilateral low back pain without sciatica  (primary encounter diagnosis)  .     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     First set of needles: Seating position at the treatment table  Mid/Bilateral: lumbar, Debria Garret, Vonna Kotyk (3)   R: YNSA D  L: [place], Zero.     Needles in: 10  Needles out: 10    Second set of needles: Reclined position at the treatment table  Mid/Bilateral: GB32, YT    Needles in: 3  Needles out: 3    Pyonex press needle (0.61mm) were placed at Du14, [id number], GB31, KD8 bilaterally and L - Cervical. I advised to keep them upto 5 days and take them off. However, if there is any irritability or discomfort felt, then remove them sooner. I also advised to discard the used one safely.     Post Treatment Note:   Derien tolerated the all the treatment well without any complications.      Time Spent:   - First set of treatment - Face to face time: 15 mins  - Second set of treatment - Face to face time: 8 mins  Total: Time spent for face-to-face for acupuncture: 23 minutes.    Augusto Gamble, AEMP, LAc   Licensed Acupuncturist, Paramedic for [employer]  Department of Family medicine  [facility]

## 2023-04-28 ENCOUNTER — Telehealth (INDEPENDENT_AMBULATORY_CARE_PROVIDER_SITE_OTHER): Payer: Self-pay | Admitting: Acupuncturist

## 2023-04-28 NOTE — Telephone Encounter (Signed)
LVM and sent My chart message to pt

## 2023-05-06 ENCOUNTER — Telehealth (INDEPENDENT_AMBULATORY_CARE_PROVIDER_SITE_OTHER): Payer: Self-pay | Admitting: Acupuncturist

## 2023-05-06 NOTE — Telephone Encounter (Signed)
Dr. Fredric Mare has two cancellation slots, so I called pt's preferred phone number and spoke to pt. Pt scheduled his acupuncture appointment on Friday, 05/14/23 @ 10:30 am.

## 2023-05-07 ENCOUNTER — Ambulatory Visit (HOSPITAL_BASED_OUTPATIENT_CLINIC_OR_DEPARTMENT_OTHER): Payer: Medicare PPO | Admitting: Anesthesiology

## 2023-05-10 ENCOUNTER — Encounter (INDEPENDENT_AMBULATORY_CARE_PROVIDER_SITE_OTHER): Payer: Medicare PPO | Admitting: Rheumatology

## 2023-05-11 ENCOUNTER — Encounter (HOSPITAL_BASED_OUTPATIENT_CLINIC_OR_DEPARTMENT_OTHER): Payer: Self-pay | Admitting: Anesthesiology

## 2023-05-11 NOTE — Progress Notes (Incomplete)
DIAGNOSTIC NERVE BLOCK OF THE CERVICAL MEDIAL BRANCHES # 1    05/12/2023  PROCEDURE:  Cervical medial branch block  bilateral, Levels: C5-6, C6-7    PRE-PROCEDURE DIAGNOSIS  No diagnosis found.Ronnie Mason    POST-PROCEDURE DIAGNOSIS  No diagnosis found..    INDICATION FOR INJECTION  Ronnie Mason has chronic, ongoing axial spine pain, the goal of today's diagnostic, local anesthetic procedure is to determine if the cervical facet joints are the primary source of his pain.  For Ronnie Mason, the procedure is indicated for the following reasons: 1) moderate to severe pain has been present for more than 3 months with functional impairment; 2) he did not respond to conservative treatment efforts including physical therapy and medications including  for longer than 3 months; 3) Ronnie Mason pain is characteristic of facet pain and is primarily axial in nature without clear radicular symptoms or other neurological components; and 4) his imaging does not reveal another source of pain (please see CPR  note dated 03/08/23 for the image summary; new imaging: NONE).    We have discussed benefits, risks, and alternatives for the diagnostic blocks, and the potential for future radiofrequency ablation. Ronnie Mason would like to undergo the procedures and signed the informed consent.      ATTENDING PHYSICIAN:  Wendi Maya MD    FELLOW / Hampton Abbot, DO    SEDATION:  NO    NPO STATUS: NO    INTRAVENOUS LINE:  NO    DESCRIPTION OF PROCEDURE  Position: Prone with the head resting in a positioning device.  Imaging technique: Multiplanar fluoroscopic guidance, appropriate images reviewed and saved.  Monitoring: Heart rate, pulse oximetry, blood pressure.  Time-out/final verification: Performed and documented in the nursing note.  The injection area was prepped with ChloraPrep and draped in a sterile fashion; sterile technique used for the entire procedure.    An anterior-posterior fluoroscopic image allowed symmetric visualization of the  upper and lower endplates at the targeted levels (bilateral C5, C6, C7).  An entry point caudal and lateral to each target was identified and and the skin was anesthetized with 1% lidocaine  without added sodium bicarbonate.  gauge needles were inserted under fluoroscopic control (total of  needles bilaterally). Multiplanar images confirmed position of the needle tips at the midportion of the articular pillar at each location. Omnipaque 240  ml was injected at each site under live fluoroscopy confirming appropriate spread without vascular uptake.  Subsequently,  ml of bupivacaine 0.5% was injected at each site and the needles were removed uneventfully.     POST-PROCEDURE  Ronnie Mason was transported to the recovery area ***in stable condition for observation.  On re-examination more than 15 minutes after the procedure:  Ronnie Mason  note reduction in pain.  Ronnie Mason  demonstrate improvement in range of motion.   Ronnie Mason had no evidence of motor block.  The pain score was  /10 prior to the procedure and /10 after the procedure (NRS 0-10, with 0 = no pain, 10 = worst pain imaginable).    INTERPRETATION  The 6h pain recording will be completed in order to fully evaluate the result of this diagnostic procedure.  Based on the initial results, the tested facet joints  a likely pain source for Ronnie Mason.     COMPLICATIONS  .    PLAN  The pain intensity will be monitored by 6h pain diary.  Subsequent care is dependent on the results of this procedure.   ***  Rosalyn Gess, DO

## 2023-05-12 ENCOUNTER — Ambulatory Visit: Payer: Medicare PPO | Attending: Anesthesiology | Admitting: Anesthesiology

## 2023-05-12 VITALS — BP 146/83 | HR 84 | Temp 97.0°F | Resp 16

## 2023-05-12 DIAGNOSIS — I159 Secondary hypertension, unspecified: Secondary | ICD-10-CM | POA: Insufficient documentation

## 2023-05-12 DIAGNOSIS — R040 Epistaxis: Secondary | ICD-10-CM | POA: Insufficient documentation

## 2023-05-12 DIAGNOSIS — Z538 Procedure and treatment not carried out for other reasons: Secondary | ICD-10-CM

## 2023-05-12 DIAGNOSIS — M542 Cervicalgia: Secondary | ICD-10-CM | POA: Insufficient documentation

## 2023-05-12 DIAGNOSIS — M47812 Spondylosis without myelopathy or radiculopathy, cervical region: Secondary | ICD-10-CM | POA: Insufficient documentation

## 2023-05-12 MED ORDER — IOHEXOL 240 MG/ML IJ SOLN
10.0000 mL | Freq: Once | INTRAMUSCULAR | Status: DC
Start: 2023-05-12 — End: 2023-09-08

## 2023-05-12 NOTE — Progress Notes (Signed)
Pain score prior to procedure:  1/10 at rest  Pain score after procedure:  1/10    Mr. Ellegood met discharge criteria:  A/OX4/baseline, VSS/returned to baseline. Procedure cancelled in procedure room due to spontaneous nose bleed.  Bleeding stopped once sitting up in recovery bay.    Dr. Dorna Bloom spoke to patient and advised that if he has further nose bleeds which are hard to stop, he should go to ED to be evaluated.  Pt will be rescheduled with Dr. Dorna Bloom.    Wife confirmed pt has nose bleeds but hasn't had one for awhile

## 2023-05-12 NOTE — Patient Instructions (Addendum)
Today's 2nd diagnostic cervical MBBs was NOT performed.  The patient will be rescheduled for cervical medial branch block # 2 in series of 2 blocks.   Subsequent care is dependent on the results of this procedure.  Anti-HTN therapy is encouraged and he is on three different medications to control his BP.  He is encouraged to go to ER should his HTN persists and his epistasis couldn't stop.  He was escorted by his wife to home.

## 2023-05-12 NOTE — Progress Notes (Signed)
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    INDICATION FOR INJECTION was confirmed with Ronnie Mason.  Ronnie Mason has chronic, ongoing axial spine pain, the goal of today's diagnostic, local anesthetic procedure is to determine if the cervical facet joints are the primary source of his pain. For Ronnie Mason, the procedure is indicated for the following reasons: 1) moderate to severe pain has been present for more than 3 months with functional impairment; 2) he did not respond to conservative treatment efforts including physical therapy and medications including  for longer than 3 months; 3) Ronnie Mason pain is characteristic of facet pain and is primarily axial in nature without clear radicular symptoms or other neurological components; and 4) his imaging does not reveal another source of pain (please see CPR  note dated 04/07/23 for the image summary; new imaging: NONE).    From the first injection, he reports significant pain reduction from 8/10 down to 2/10 at the baseline. He is excitied that the block is therapeutic and believe the factogenic pain is real. For longer term pain relief, he wants to repeat the 2nd injection for the insurance approval of RFA. We discussed that his baseline pain is mild, and today's injection could be postponed to later time. But he would like to proceed.    We have discussed benefits, risks, and alternatives for the diagnostic blocks, and the potential for future radiofrequency ablation. Ronnie Mason would like to undergo the procedures and signed the informed consent.      ATTENDING PHYSICIAN:  Wendi Maya MD    FELLOW: Lyda Perone, MD    SEDATION:  NO    NPO STATUS: NO    INTRAVENOUS LINE:  NO    DESCRIPTION OF PROCEDURE  Position: Prone with the head resting in a positioning device.  Imaging technique: Multiplanar fluoroscopic guidance, appropriate images reviewed and saved.  Monitoring: Heart rate, pulse oximetry, blood pressure.  Time-out/final verification: Performed and documented in the nursing note.  The injection area was prepped with ChloraPrep and draped in a sterile fashion; sterile technique used for the entire procedure.    While optimal imaging was being obtained in AP and lateral views, the patient began to experience epistaxis in prone position. The patient was given gauze to attempt to apply external pressure to both nasal passages and stop the bleeding; however, the procedure was ultimately terminated as the patient continued to have bleeding from the nose in prone position and the team agreed it would be very difficult to stop with nasal tamponade unless the patient was reclined in seated position. Additionally, the patient's intra-procedure blood pressure cuff reading increased from pre-procedure baseline (130s / 70s) to 170s / 100s and it was decided that the patient needed to be reassessed in procedure recovery bay. On review of systems, the patient denied lightheadedness, dizziness, nausea, or chest pain. He again reiterated he was not on anticoagulant medication.     POST-PROCEDURE:  In the procedure recovery area, the patient was given paper towel and was able to stop the bleeding after several minutes. We reassured the patient that, if he still wished to have this diagnostic block performed in  the future, that we would work with him and his spouse to coordinate a rescheduled procedure slot. Of note, the patient and spouse reported the patient has in the past had nose bleeds periodically, although he was unclear how recently this last had occurred.     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  The patient will be rescheduled for cervical medial branch block # 2 in series of 2 blocks.   Will consider anxiolytic medication (xanax) should he feels anxiety is the trigger of HTN.  Subsequent care is dependent on the results of this procedure.  Anti-HTN therapy is encouraged and he is on three different medications to control his BP.  He is encouraged to go to ER should his HTN persists and his epistasis couldn't stop.  He was escorted by his wife to home.    Lyda Perone, MD  Pain Medicine Fellow  05/12/23    I saw and evaluated the patient with Dr. Delories Heinz, who conducted the initial history.  I reviewed and confirmed the history in detail with Ronnie Mason in person.   I was present for the examination and formulation portions of the encounter. I agree with the findings and the plan of care as documented in our notes. I did edit the trainee's note.     Wendi Maya, MD    Interventional Pain Specialist  [facility]    Notation: This note has been generated with a voice recognition dictation software and electronically signed.  5:02 PM  05/12/2023

## 2023-05-12 NOTE — Progress Notes (Signed)
Ronnie Mason identified by name and DOB.   He confirmed he is having a Bilateral C5-C6 and C6-C7 cervical MBB  He was admitted to procedure area using a cane.  He has not fallen in the last 6 months.  Fall prevention interventions: Patient provided with non-skid stockings  He denies taking any blood thinners.  He denies having a bleeding disorder.  He has held his medications per provider instructions.  He has not been on antibiotics for the past two weeks.   He denies a history of fainting during medical procedures.   He has not been NPO.  He undressed self without nursing assistance.  Is He receiving a steroid injection today? No  I verbally reviewed written discharge instructions and pain diary with him.   Signed consent for series not older than 90 days is scanned in the patient's chart: Yes  Confirmed ride home with his Wife     He has a pre-procedure pain score of 1/10 in neck    TIME Pain Score at rest: Pain Score:  Bend forward   Pain Score:   Bend backward   Pain Score:  Neck Bend;twist right Pain Score:  Neck Bend; twist left     Pre-Procedure 3:30 PM   1/10 1/10 8/10 8/10 8/10

## 2023-05-13 ENCOUNTER — Ambulatory Visit (HOSPITAL_BASED_OUTPATIENT_CLINIC_OR_DEPARTMENT_OTHER): Payer: Medicare PPO | Admitting: Anesthesiology

## 2023-05-14 ENCOUNTER — Ambulatory Visit (INDEPENDENT_AMBULATORY_CARE_PROVIDER_SITE_OTHER): Payer: Medicare PPO | Admitting: Acupuncturist

## 2023-05-14 DIAGNOSIS — M5459 Other low back pain: Secondary | ICD-10-CM

## 2023-05-14 DIAGNOSIS — G8929 Other chronic pain: Secondary | ICD-10-CM

## 2023-05-14 NOTE — Progress Notes (Signed)
[facility]    Acupuncture Follow-Up Visit  Referred by: Drexel Iha, MD     PCP: [name], MD    Primary Concern:   Chief Complaint   Patient presents with    Acupuncture     Chronic low back pain     CC1: Chronic Lower back pain  - Location: Bilateral lower back  - Onset; Chronic   - It has been like there around 5-6/10  - 7-8/10 pain three times a week, with spans   - The elevated level of pain is triggered by the turning body  - Pain concentrates at left lower back.   - Tylenol is 1000 mg per day in addition to other pain prescriptions.     He was told that his back has several advanced issue (such as degeneration and arthritidis), and was recommend to consider about a back surgery. Jye will be talking to a neurosurgeon (who he has talked to before for his neck) regarding his lower back and possible surgery on his back.   This Tuesday was about to have a procedure to the neck, however, after he was in prone position, he started having nasal bleeding, and the procedure was postponed.     Other related MSK issues  Neck and shoulder tension - Today, neck is doing better.     Caution: No prone treatment Demonta Wombles recently (05/12/2023) experienced epistaxis during the neck procedure and procedure was postpone due to the epistaxis.   He noted piror to the procedure, the BP was normal range 130's/70's and after the prone position, BP went up 170's/100's.   He was strongly recommended to see his PCP to be fully assessed. [name] noted today's BP is ok, and no dizziness or headache experienced.   Acelin had similar epistaxis last year while he was in the prone position during the acupuncture treatment.     Allergies:  Review of patient's allergies indicates:  Allergies   Allergen Reactions    Adhesives Skin: Rash     Glue on lido patches    Bee Venom Skin: Hives, Skin: Itching and Swelling    Gabapentin Other     Flu like symptoms, diarrhea, body ache upset  stomach    Methocarbamol Other     Patient's wife reports cognitive difficulties ("goofy")        Objective:  Pulse  - Deferred.  Tongue - Deferred.    Assessment/Plan:    Amahri  is following up with acupuncture and [place] medicine for consultation and treatment of:    (M54.50,  G89.29) Chronic bilateral low back pain without sciatica  (primary encounter diagnosis)  .     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     First set of needles: Seating Position  Mid/BilateralL Du22, YT   R: SI3, Shenmen, Lumbar, Debria Garret. SP9   L: YNSA D1, GB31, UB62    Needles in: 10  Needles out: 10    Second set of needles: Seating Position  Mid/Bilateral: Justin Mend  (2)   L:  YNSA D2  R: Zong Bai    Needles in: 4  Needles out: 4    Post Treatment Note:   Duwan tolerated the  all the treatment well without any complications. There was not concerning or red flag was seen during today's visit, however, I strongly advised him to reach out his PCP/clinic at his earliest occasion and have an assessment for the nasal bleeding that he experienced on 05/11/2023.     Time Spent:   - First set of treatment - Face to face time: 15 mins  - Second set of treatment - Face to face time: 8 mins  Total: Time spent for face-to-face for acupuncture: 23 minutes.      Augusto Gamble, AEMP, LAc   Licensed Acupuncturist, Paramedic for [employer]  Department of Family medicine  [facility]

## 2023-05-17 ENCOUNTER — Telehealth (INDEPENDENT_AMBULATORY_CARE_PROVIDER_SITE_OTHER): Payer: Self-pay | Admitting: Acupuncturist

## 2023-05-17 ENCOUNTER — Other Ambulatory Visit: Payer: Self-pay

## 2023-05-17 NOTE — Telephone Encounter (Signed)
Sent Mychart message to pt.

## 2023-05-18 ENCOUNTER — Telehealth (HOSPITAL_BASED_OUTPATIENT_CLINIC_OR_DEPARTMENT_OTHER): Payer: Self-pay

## 2023-05-18 NOTE — Telephone Encounter (Signed)
-----   Message from [place] sent at 05/18/2023  3:03 PM PDT -----  Regarding: FW: follow-up  Please see below.     Stephannie Li  ----- Message -----  From: Madaline Savage, MBBS  Sent: 04/02/2023   2:07 PM PDT  To: Angelena Form, Coordinator  Subject: follow-up                                        Hi Jan,     Could you please get in touch with this patient and offer him a follow-up appointment to discuss his lower back? Non-urgent. I last saw him in January of 2024.     Thanks,  IK

## 2023-05-18 NOTE — Telephone Encounter (Signed)
Left message for patient asking him to call [phone] to call to schedule follow up with Dr. Welton Flakes per providers request.

## 2023-05-21 NOTE — Telephone Encounter (Signed)
Visit with Dr. Welton Flakes on 09/01/22. Routing to [employer] as Lorain Childes

## 2023-05-26 ENCOUNTER — Encounter (HOSPITAL_BASED_OUTPATIENT_CLINIC_OR_DEPARTMENT_OTHER): Payer: Self-pay

## 2023-05-26 NOTE — Progress Notes (Unsigned)
I saw and evaluated the patient with Dr. Ambrose Finland, who conducted the initial history.  I reviewed and confirmed the history in detail with Mr. Derienzo in person.   I was present for the examination and formulation portions of the encounter. I agree with the findings and the plan of care as documented in our notes. I did edit the trainee's note.    I spent a total of 30 minutes for the patient's care on the date of the service.          [name], MD  Medical Director   [facility]  [facility]   Department of Anesthesiology and Pain Medicine

## 2023-05-27 ENCOUNTER — Telehealth (HOSPITAL_BASED_OUTPATIENT_CLINIC_OR_DEPARTMENT_OTHER): Payer: Self-pay

## 2023-05-27 ENCOUNTER — Encounter (HOSPITAL_BASED_OUTPATIENT_CLINIC_OR_DEPARTMENT_OTHER): Payer: Self-pay | Admitting: Anesthesiology

## 2023-05-27 ENCOUNTER — Ambulatory Visit: Payer: Medicare PPO | Attending: Anesthesiology | Admitting: Anesthesiology

## 2023-05-27 VITALS — BP 129/83 | HR 85 | Temp 97.6°F | Ht 76.0 in | Wt 225.0 lb

## 2023-05-27 DIAGNOSIS — M47812 Spondylosis without myelopathy or radiculopathy, cervical region: Secondary | ICD-10-CM | POA: Insufficient documentation

## 2023-05-27 DIAGNOSIS — M5417 Radiculopathy, lumbosacral region: Secondary | ICD-10-CM | POA: Insufficient documentation

## 2023-05-27 DIAGNOSIS — M47816 Spondylosis without myelopathy or radiculopathy, lumbar region: Secondary | ICD-10-CM | POA: Insufficient documentation

## 2023-05-27 DIAGNOSIS — M5410 Radiculopathy, site unspecified: Secondary | ICD-10-CM | POA: Insufficient documentation

## 2023-05-27 DIAGNOSIS — G8929 Other chronic pain: Secondary | ICD-10-CM | POA: Insufficient documentation

## 2023-05-27 NOTE — Telephone Encounter (Signed)
-----   Message from [place] sent at 05/18/2023  3:03 PM PDT -----  Regarding: FW: follow-up  Please see below.     Stephannie Li  ----- Message -----  From: Madaline Savage, MBBS  Sent: 04/02/2023   2:07 PM PDT  To: Angelena Form, Coordinator  Subject: follow-up                                        Hi Jan,     Could you please get in touch with this patient and offer him a follow-up appointment to discuss his lower back? Non-urgent. I last saw him in January of 2024.     Thanks,  IK

## 2023-05-27 NOTE — Telephone Encounter (Signed)
Left message for patient to call [phone] to schedule clinic follow up with Dr. Welton Flakes.

## 2023-05-27 NOTE — Telephone Encounter (Signed)
Dr. Fredric Mare has a cancellation today, 05/27/23 @ 1:30 pm, so I called pt's preferred phone number and spoke to pt. Pt has another doctor's appointment, so he could not make it.

## 2023-05-27 NOTE — Progress Notes (Signed)
CHIEF COMPLAINT:   Chief Complaint   Patient presents with    Neck Pain    Back Pain    Low Back Pain       HISTORY OF PRESENT ILLNESS:    The purpose of today's visit is interval reevaluation of multiple pain complaints (both severe neck and lower back). The patient is a candidate for cervical decompression and fusion (likely C4-6 ACDF). 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 The patient was last seen by Dr. Dorna Bloom on 05/12/23 at which time we attempted diagnostic mbb of bilateral C5-6, C-7 facet joints which was stopped due to continued epistaxis in the prone position. This was rescheduled.    Neck: Overall feeling better. MBB of neck worked well with some relief. Able to turn head better. Hurts on the R posterior occiput more than L with acute pain flares  2-3/10. Constant aching with intermittent sharp pains. Better with ice and resting. Worse with straining and painting the ceiling of his basement    Lower back:  Injections in the sacral region, which did help.   R sided, localized to specific point ~L3/4 facet. Spastic feeling which is better with sitting, worse with turning and twisting and he gets shooting sensatino down his legs. 5-6/10 today. Denies numbness and tingling down the legs. Using a cane for walking.  Occasionally a very severe pain down the legs into the feet.    Labs/Imaging Reviewed:    None        REVIEW OF SYSTEMS:  The review of systems documented by our clinic staff was provided by the patient. I did review the ROS and have no further detail to add.    PHYSICAL EXAMINATION:  BP 129/83   Pulse 85   Temp 36.4 C (Temporal)   Ht 6\' 4"  (1.93 m)    Wt (!) 102.1 kg (225 lb) Comment: per patient  SpO2 97%   BMI 27.39 kg/m    Constitutional:Constitutional: well-appearing, in no distress, well-nourished, and cooperative   Skin, exposed parts: color normal, no evidence of bleeding or bruising, no edema  Eyes: Negative for miosis bilaterally. Pupils appropriate for room lighting. Extraocular movements are intact bilaterally  Cardiovascular: No cyanosis, no dilation of jugular veins, no dyspnea. Bilateral radial pulses are intact and regular   Respiratory: Respirations are unlabored. No accessory muscle use noted  Neurologic: CN II-XII grossly intact  Musculoskeletal: Normal ROM and strength grossly  Psychiatric: Judgement/insight are intact. Mood is good. Affect is congruent. Patient is oriented to person, place and time and situation    IMPRESSION:   Ronnie Mason is a 72 year old male with   1. Progressive left neck pain with radiation to left lateral upper arm.  Exam and EMG consistent with C5 and C6 radiculopathy. Evaluated by Dr. Welton Flakes on 09/01/22 and determined to be likely candidate for C4-6 ACDF. Undergoing diagnostic MBB at b/l C5-6,C6-7 in effort to delay surgery.  Patient to delay surgery. . Now currently stable, continuing with MBB pathway.  2. Lower back pain multifactorial including lumbar facet arthropathy no longer responsive to mbb pathway, spinal stenosis, DDD. Pain significantly worsening an impacting function.   3. Right hip pain 2/2 severe OA  4.  Prior treatment failures including Short duration of benefit for interlaminar lumbar epidural steroids and TFESI, MBB, memantine, Naltrexone.   5. 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  Mr. Dente had no further questions.    Medication suggestions:   Continue pregabalin 300mg  BID  Continue duloxetine 60mg  BID  Continue lidocaine patch    Procedure suggestions:   Continue with diagnostic MBB #2 to b/l C5-6, C6-7 facet joints as planned    Can consider spinal cord stimulation for lower back and leg symptoms depending on surgical options     Continue with acupuncture     Follow-up: Follow up with Dr. Welton Flakes regarding lower back

## 2023-05-27 NOTE — Progress Notes (Signed)
Review of Systems   Constitutional: Negative.    HENT: Negative.    Eyes: Negative.    Respiratory: Negative.    Cardiovascular: Negative.    Gastrointestinal: Negative.    Endocrine: Negative.    Genitourinary: Negative.    Musculoskeletal: Positive for arthralgias, back pain, gait problem, myalgias, neck pain and neck stiffness.   Skin: Negative.    Allergic/Immunologic: Negative.    Hematological: Negative.    Psychiatric/Behavioral: Negative.

## 2023-06-04 ENCOUNTER — Ambulatory Visit: Payer: Medicare PPO | Attending: Anesthesiology | Admitting: Anesthesiology

## 2023-06-04 VITALS — BP 131/82 | HR 80 | Temp 98.1°F | Resp 16

## 2023-06-04 DIAGNOSIS — M542 Cervicalgia: Secondary | ICD-10-CM | POA: Insufficient documentation

## 2023-06-04 DIAGNOSIS — M47812 Spondylosis without myelopathy or radiculopathy, cervical region: Secondary | ICD-10-CM | POA: Insufficient documentation

## 2023-06-04 NOTE — Patient Instructions (Signed)
 [facility]  Post-Procedure Diagnostic Block Instructions with Pain Diary    Post-procedure care:  Today, you underwent a procedure that may or may not treat your pain, but should help [place] identify the source of your pain.  Continue taking your scheduled medications.  If you experience increasing pain, take your regular short-acting medications.    Site care:  You may remove the band-aid after a few hours.  You may shower today. No swimming, tub baths, or hot tubs for 24 hours following your procedure.    Activity recommended during the 1st 6 hours after the procedure:  Continue normal activities, including those that cause the pain.     Do not lie down and rest after the procedure.    Do not drive or operate machinery for 24 hours after your procedure if you were given sedation    Medications:  If you stopped taking any blood thinning medications such as Coumadin or Plavix, you may resume these tomorrow unless specified differently by the prescribing physician.    Follow-up:   You are being sent home with the Patient Self Administered 6 Hour Pain Diary to record your pain scores during the 1st 6 hours after your procedure.  After you have completed your 6 hour pain diary, find the time when you had the lowest pain score meaning the least amount of pain.  Then answer "When I had the lowest pain score= least amount of pain, my pain was relieved by _____%".    If you have pain relief longer than 6 hours after your procedure, please make note of this on the diary every 4-5 hours until the pain returns.  You do not need to stay up and active during this time.     Call us if you develop any of the following symptoms in the next 7 days:  Fever above 100 degrees F     Any unusual increase in your level of pain  Swelling, bleeding, redness, or increased tenderness at the procedure or IV site    Contact us:  Anytime, 24 hours/day, 7 days/week at [phone], option 2, option 2.        Patient Self-Administered 6  hour Pain Diary    1.  For accuracy, keep this diary with you during the 6 hour period so you can write down your pain scores as you go, at the time noted on your diary.  During the 6 hours, please your pain scores while doing activities that provoke your pain as well as when you are not doing those activities.      2.  Within 24 hours of completing this pain score diary, please send [place] your pain score diary either by:  1)  Scanning into [facility]  2)  Faxing to [phone] or 3)  Calling the pain score voicemail line at [phone].  Leave the spelling of your first and last name,  U#, date of birth, date of service and the 20 numbers you wrote below, including pre-procedure, immediately post procedure and % pain relief.  After that, you may leave any comments regarding your post block experience that may be helpful (e.g. was able to walk, much worse, had to take medications).      [name]   Procedure Date: 06/04/23  Procedure Name: Bilateral C5-C6 and C6-C7 MBB, 2 of 2  Procedure Attending: Araceli Bouche    Use this scale to rate your pain  0 1 2 3 4 5 6 7 8  9  10  No pain                                                          worst pain     TIME PAIN SCORE WITH PAINFUL ACTIVITY PAIN SCORE WITHOUT PAINFUL ACTIVITY   Before injections:       Immediately after injections:       30 minutes after injections:       1 hour after injections:       2 hours after injections:       3 hours after injections:       4 hours after injections:       5 hours after injections:       6 hours after injections:       When I had the lowest pain score, my % (percentage) pain relief:          IT IS ESSENTIAL YOU CALL IN YOUR PAIN DIARY RESULTS BEFORE ANY FURTHER PROCEDURE(S) CAN BE SCHEDULED.

## 2023-06-04 NOTE — Progress Notes (Signed)
 DIAGNOSTIC NERVE BLOCK OF THE CERVICAL FACET JOINTS, Diagnostic #2      PROCEDURE  Nerve block of the facet joint C5-6, C6-7; side: bilateral.    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    POST-PROCEDURE DIAGNOSIS  (M47.812) Arthropathy of cervical facet joint  (primary encounter diagnosis).    INDICATION FOR INJECTION  Chronic neck pain of suspected facet origin    ATTENDING PHYSICIAN     [name], MD.    FELLOW / RESIDENT  Laurena Bering, MD    NPO STATUS  No.    CONSCIOUS SEDATION     None.    IV LINE  None.    MONITORING  Pulse oximetry and non-invasive blood pressure.    TIME-OUT  Performed.    DESCRIPTION OF PROCEDURE  Position: Supine  Imaging technique: Fluoroscopy.  Preparation: The injection area was prepped and draped in a sterile fashion.  Imaging for needle placement: Lateral fluoroscopic image to superimpose the right with the left facet joints.  Needle used: 25G, 3.5 inch.  Needle insertion: Under fluoroscopic tunnel view.  Imaging conditions: Poor visualization of the target bony structures at C6 and C7, but achievable.      Injection of the C5 medial branch: Right  Needle positioned in center of the cuboid mass. Then injection of 0.5 ml of Lidocaine 2%. Uneventful removal of the needle.    Injection of the C6 medial branch:Right  Needle positioned in center of the cuboid mass. Then injection of 0.5 ml of 0.5 ml of Lidocaine 2%. Uneventful removal of the needle.    Injection of the C7 medial branch: Right  Needle positioned in center of the cuboid mass. Then injection of 0.5 ml of 0.5 ml of Lidocaine 2%. Uneventful removal of the needle.    Injection of the C5 medial branch: Left  Needle positioned in center of the cuboid mass. Then injection of 0.5 ml of Lidocaine 2%. Uneventful removal of the needle.    Injection of the C6 medial branch:Left  Needle positioned in center of the cuboid mass. Then injection of 0.5 ml of 0.5 ml of  Lidocaine 2%. Uneventful removal of the needle.    Injection of the C7 medial branch: Left  Needle positioned in center of the cuboid mass. Then injection of 0.5 ml of 0.5 ml of Lidocaine 2%. Uneventful removal of the needle.      COMPLICATIONS  NONE.    POST-PROCEDURE  Mr. Neels was transported to the recovery area in stable condition for observation.  On re-examination more than 15 minutes after the procedure:  Mr. Fearnow did note reduction in pain.  Mr. Dennie did demonstrate improvement in range of motion.   The pain score was  4-5/10 prior to the procedure and 0/10 after the procedure (NRS 0-10, with 0 = no pain, 10 = worst pain imaginable).    PLAN  The pain intensity will be monitored by 6h pain diary.  Subsequent care is dependent on the results of this procedure.    I performed the procedure with observation by the resident.

## 2023-06-04 NOTE — Progress Notes (Signed)
 Pain score prior to procedure:  4-5/10  Pain score after procedure:  2/10    [name] met discharge criteria:  A/OX4/baseline, VSS/returned to baseline. Mobility returned to baseline prior to discharge. Procedure site clean dry and intact.  Written and verbal discharge instructions, including emergency contact phone numbers, reviewed with [name]. Verbal understanding obtained from him.  [name] dressed self without nursing assistance. He was discharged in stable condition from procedure area using a cane. to home with all belongings and with ride/responsible person.     TIME Pain Score at rest: Pain Score:  Bend forward   Pain Score:   Bend backward   Pain Score:  Neck Bend;twist right Pain Score:  Neck Bend; twist left     30 minutes post procedure 2 2 Declines 0 0

## 2023-06-04 NOTE — Progress Notes (Signed)
 Ronnie Mason identified by name and DOB.   He confirmed he is having a Bilateral C5-C6 and C6-C7 MBB, 2 of 2  He was admitted to procedure area using a cane.    He has not fallen in the last 6 months and feels unsteady on his feet.  Will be a stretcher transfer for procedure.  Fall prevention interventions: Patient provided with non-skid stockings  He denies taking any blood thinners.  He denies having a bleeding disorder.  He has held his medications per provider instructions.  He has not been on antibiotics for the past two weeks.   He denies a history of fainting during medical procedures.   He has not been NPO.  He undressed self without nursing assistance.  Is He receiving a steroid injection today? no  If yes, has He received the COVID-19 vaccine (either brand) within the past 2 weeks? no  OR does He plan to receive the COVID-19 vaccine (either brand) within the next 2 weeks? Yes  I verbally reviewed written discharge instructions and pain diary with him.   Signed consent for series not older than 90 days is scanned in the patient's chart:      Not applicable  Confirmed ride home with his wife, Ronnie Mason   at bedside  He has a pre-procedure pain score of 4-5    TIME Pain Score at rest: Pain Score:  Bend forward   Pain Score:   Bend backward   Pain Score:  Neck Bend;twist right Pain Score:  Neck Bend; twist left     Pre-Procedure   2 2 Declines 4-5 4-5

## 2023-06-08 ENCOUNTER — Other Ambulatory Visit (HOSPITAL_BASED_OUTPATIENT_CLINIC_OR_DEPARTMENT_OTHER): Payer: Self-pay | Admitting: Anesthesiology

## 2023-06-08 DIAGNOSIS — M47812 Spondylosis without myelopathy or radiculopathy, cervical region: Secondary | ICD-10-CM

## 2023-06-10 ENCOUNTER — Telehealth (INDEPENDENT_AMBULATORY_CARE_PROVIDER_SITE_OTHER): Payer: Self-pay | Admitting: Acupuncturist

## 2023-06-10 NOTE — Telephone Encounter (Signed)
Sent Mychart message to pt.

## 2023-06-13 ENCOUNTER — Other Ambulatory Visit (INDEPENDENT_AMBULATORY_CARE_PROVIDER_SITE_OTHER): Payer: Self-pay | Admitting: Internal Medicine

## 2023-06-13 DIAGNOSIS — L309 Dermatitis, unspecified: Secondary | ICD-10-CM

## 2023-06-16 MED ORDER — BETAMETHASONE DIPROPIONATE 0.05 % EX LOTN
TOPICAL_LOTION | CUTANEOUS | 1 refills | Status: DC
Start: 2023-06-16 — End: 2023-09-08

## 2023-06-17 ENCOUNTER — Other Ambulatory Visit (INDEPENDENT_AMBULATORY_CARE_PROVIDER_SITE_OTHER): Payer: Self-pay | Admitting: Internal Medicine

## 2023-06-17 DIAGNOSIS — I10 Essential (primary) hypertension: Secondary | ICD-10-CM

## 2023-06-17 DIAGNOSIS — E785 Hyperlipidemia, unspecified: Secondary | ICD-10-CM

## 2023-06-17 DIAGNOSIS — Z8679 Personal history of other diseases of the circulatory system: Secondary | ICD-10-CM

## 2023-06-18 ENCOUNTER — Encounter (HOSPITAL_BASED_OUTPATIENT_CLINIC_OR_DEPARTMENT_OTHER): Payer: Self-pay | Admitting: Student in an Organized Health Care Education/Training Program

## 2023-06-18 ENCOUNTER — Ambulatory Visit
Payer: Medicare PPO | Attending: Student in an Organized Health Care Education/Training Program | Admitting: Student in an Organized Health Care Education/Training Program

## 2023-06-18 VITALS — BP 122/71 | HR 76 | Temp 97.5°F | Ht 76.0 in | Wt 225.0 lb

## 2023-06-18 DIAGNOSIS — M48061 Spinal stenosis, lumbar region without neurogenic claudication: Secondary | ICD-10-CM | POA: Insufficient documentation

## 2023-06-18 NOTE — Progress Notes (Signed)
RETURN VISIT  MRN: [id number]    Chief Complaint:  Lower back pain     History of Present Illness:   Ronnie Mason is a 72 year old male with complaints of neck pain and lower back pain, presenting for a follow-up. Patient is accompanied by his wife at today's visit. Patient reports ongoing neck pain radiating down to his shoulders bilaterally (L>R). He has been working with Drs. Pepperzak and Wu. Recently underwent 2 MBBs and plan for RFAs in February. Denies radicular arm pain, numbness or weakness.     Patient reports ongoing lower back pain, spanning across both sides. He will have intermittent spasms along the left side of his lower back. He will seldom have pain radiating down his left leg. Reports history of neuropathy in both feet, which has not changed but continues to be bothersome. Reports feeling weak and unsteady, relies on a cane to ambulate. Denies recent falls or urinary/bowel incontinence. Patient primarily relies on diclofenac, tylenol PRN, and ice/heat for pain management.     Patient underwent a caudal ESI on 03/05/2023 and a L1/2 ESI on 04/22/2023 with Dr. Aron Baba with significant benefit.     Social History:  Social History     Tobacco Use    Smoking status: Never    Smokeless tobacco: Never    Tobacco comments:     Sometimes chews, but is trying to quit. 11/28/21   Substance Use Topics    Alcohol use: Yes     Alcohol/week: 3.0 standard drinks of alcohol     Types: 3 Glasses of wine per week     Comment: goal to get down to 2 per day    Drug use: Yes     Frequency: 1.0 times per week     Types: Marijuana     Comment: CBD (Occassionally)         Allergies:  Review of patient's allergies indicates:  Allergies   Allergen Reactions    Adhesives Skin: Rash     Glue on lido patches    Bee Venom Skin: Hives, Skin: Itching and Swelling    Gabapentin Other     Flu like symptoms, diarrhea, body ache upset stomach    Methocarbamol Other     Patient's wife reports cognitive difficulties ("goofy")        Medications:  Current Outpatient Medications   Medication Sig Dispense Refill    Acetaminophen 500 MG Oral Tab Take 3 tablets (1,500 mg) by mouth 2 times a day.      Alpha-Lipoic Acid 300 MG tablet Take 300 mg by mouth 2 times a day.      amLODIPine 5 MG tablet Take 1 tablet (5 mg) by mouth 2 times a day. 180 tablet 1    atorvastatin 10 MG tablet Take 1 tablet (10 mg) by mouth daily. 90 tablet 0    BENFOTIAMINE OR Take 250 mg by mouth every morning.      betamethasone dipropionate 0.05 % lotion APPLY TO SCALP AND EARS TOPICALLY 2 TIMES A DAY AS NEEDED FOR ITCHING. Notify MD of any signs of infection or skin thinning 60 mL 1    Cholecalciferol (VITAMIN D3) 2000 units Oral Cap Take 1 capsule (2,000 Units) by mouth daily. For low level 1 capsule 1    clindamycin 1 % gel Apply 1 Application  topically 2 times a day as needed. Apply to back.      cyanocobalamin (Vitamin B-12) 1000 MCG tablet Take 1 tablet (1,000 mcg)  by mouth daily.      diclofenac 1 % gel Apply 4 g topically 4 times a day. Apply to 2 gram four times daily to neck, trapezius muscle, and low back. Use a max of 32 grams a day. 300 g 4    DULoxetine 60 MG DR capsule Take 1 capsule (60 mg) by mouth 2 times a day. 180 capsule 1    EPINEPHrine 0.3 MG/0.3ML auto-injector Inject one pen (0.3mg /mL) into the muscle for severe allergic reaction. Call 911. Use second pen if symptoms continue 2 each 0    ibuprofen 200 MG tablet Take 2 tablets (400 mg) by mouth every 8 hours as needed for pain.      lidocaine 5 % patch Apply 1 patch onto the skin daily as needed. Apply to painful area for up to 12 hours in a 24 hour period. 30 patch 3    lisinopril 20 MG tablet Take 1 tablet (20 mg) by mouth 2 times a day. 200 tablet 0    MAGNESIUM OR Take 2 tablets by mouth at bedtime.      mirabegron ER (Myrbetriq) 50 MG 24 hr tablet Take 1 tablet (50 mg) by mouth daily. 100 tablet 3    multivitamin with minerals tablet Take 1 tablet by mouth daily.      naltrexone 50 MG  tablet TAKE 1 TABLET BY MOUTH DAILY WITH FOOD 30 tablet 11    Omega-3 Fatty Acids (OMEGA 3 OR) Take 1 tablet by mouth every morning.      pregabalin 300 MG capsule Take 1 capsule (300 mg) by mouth 2 times a day. 180 capsule 0    primidone 50 MG tablet Take 1 tablet (50 mg) by mouth 2 times a day. 180 tablet 4    Thiamine HCl (VITAMIN B-1 OR) 100 mg.       Current Facility-Administered Medications   Medication Dose Route Frequency Provider Last Rate Last Admin    iohexol (Omnipaque) 240 MG/ML contrast 10 mL  10 mL See Admin Instructions Once Lyda Perone, MD             PHYSICAL EXAM:  BP 122/71   Pulse 76   Temp 36.4 C (Temporal)   Ht 6\' 4"  (1.93 m)   Wt (!) 102.1 kg (225 lb) Comment: pt reported  SpO2 96%   BMI 27.39 kg/m   General: healthy, alert, no distress  Body Habitus: normal build   Neurologic:   Mental Status: alert & oriented X 3  Motor:    Tone: normal    Atrophy/Fasiculations: absent    STRENGTH RIGHT LEFT   Shoulder Abduction 5/5 4+/5   Elbow Flexion 5/5 5/5   Elbow Extension 5/5 5/5   Wrist Extension 5/5 5/5   Grip 5/5 4+/5   Finger Abduction 5/5 5/5          STRENGTH RIGHT LEFT   Hip Flexion  5/5 4+/5   Knee extension 5/5 5/5   Knee flexion  5/5 5/5   Dorsiflexion 5/5 5/5   Plantar flexion 5/5 5/5   EHL 5/5 5/5   Gluteus medius  5/5 4+/5     Sensory:  LIGHT TOUCH RIGHT LEFT   Lateral arm (C5) Normal Normal   Lateral forearm/hand (C6) Normal Normal   3rd digit (C7) Normal Normal   Medial hand (C8) Normal Normal   Medial forearm (T1) Normal Normal     LIGHT TOUCH RIGHT LEFT   Anteromedial thigh (L1, L2) Normal Normal  Medial Knee (L3) Normal Normal   Medial Malleolus/Calf (L4) Normal Normal   Great toe/Lateral calf (L5) Normal Normal   Lateral Foot (S1) Altered (tingling) Altered (tingling)     Reflexes:  REFLEXES RIGHT LEFT   Bicpes (C5) 1+/4 1+/4   Brachioradialis (C6) 1+/4 1+/4   Triceps (C7) 1+/4 1+/4   Patellar (L4) 1+/4 1+/4   Achilles (S1) 1+/4 1+/4   Hoffman's Absent Absent   Clonus  Absent Absent   Babinski Absent Absent       Station and Gait:  shuffled gait, stable       IMAGING:  No new imaging to review at today's visit     DIAGNOSIS:  Encounter Diagnosis   Name Primary?    Spinal stenosis of lumbar region without neurogenic claudication Yes         PLAN:  Ronnie Mason is a 72 year old male with complaints of neck pain and lower back pain, presenting for a follow-up. Patient was seen by both Dr. Welton Flakes and myself. Please refer to his note for a detailed assessment and plan.    Plan:  - Ref to PT (external referral provided to patient)  - F/up after RFAs       Earl Lites  Nurse Practitioner  Department of Neurological Surgery   [facility]

## 2023-06-20 MED ORDER — ATORVASTATIN CALCIUM 10 MG OR TABS
10.0000 mg | ORAL_TABLET | Freq: Every day | ORAL | 1 refills | Status: DC
Start: 2023-06-20 — End: 2024-01-11

## 2023-06-20 MED ORDER — AMLODIPINE BESYLATE 5 MG OR TABS
5.0000 mg | ORAL_TABLET | Freq: Two times a day (BID) | ORAL | 1 refills | Status: DC
Start: 2023-06-20 — End: 2024-02-26

## 2023-06-20 NOTE — Telephone Encounter (Signed)
NV 11/05/23

## 2023-07-05 ENCOUNTER — Telehealth (HOSPITAL_BASED_OUTPATIENT_CLINIC_OR_DEPARTMENT_OTHER): Payer: Self-pay

## 2023-07-05 NOTE — Telephone Encounter (Signed)
 Patient calling bc he is in tremendous pain and does no feel like he is able to wait until February to have the RFA done... which is not yet scheduled but slots are on hod pending insurance auth in 2025.    He is asking for Jonni Sanger help him get relief by either another shot in the neck or pills...Marland Kitchenhe ddi not specify what pills    He did ask to speak with Christiane Ha but I explained that we must send a message and that RN's will follow up with him.    Wren Gallaga,PSS  Routing to Nursing

## 2023-07-06 ENCOUNTER — Encounter (HOSPITAL_BASED_OUTPATIENT_CLINIC_OR_DEPARTMENT_OTHER): Payer: Self-pay | Admitting: Medical

## 2023-07-06 ENCOUNTER — Other Ambulatory Visit (HOSPITAL_BASED_OUTPATIENT_CLINIC_OR_DEPARTMENT_OTHER): Payer: Self-pay | Admitting: Anesthesiology

## 2023-07-06 ENCOUNTER — Encounter (HOSPITAL_BASED_OUTPATIENT_CLINIC_OR_DEPARTMENT_OTHER): Payer: Self-pay | Admitting: Anesthesiology

## 2023-07-06 ENCOUNTER — Ambulatory Visit: Payer: Medicare PPO | Attending: Medical | Admitting: Medical

## 2023-07-06 VITALS — BP 113/72 | HR 77 | Temp 97.1°F | Ht 76.0 in | Wt 225.0 lb

## 2023-07-06 DIAGNOSIS — M47812 Spondylosis without myelopathy or radiculopathy, cervical region: Secondary | ICD-10-CM | POA: Insufficient documentation

## 2023-07-06 MED ORDER — DICLOFENAC SODIUM 75 MG OR TBEC
75.0000 mg | DELAYED_RELEASE_TABLET | Freq: Two times a day (BID) | ORAL | 1 refills | Status: DC
Start: 2023-07-06 — End: 2023-08-30

## 2023-07-06 NOTE — Patient Instructions (Signed)
 It was my pleasure to help take part in your care today.  Thank you for coming to visit [place] at the [facility]. Please contact our clinic if you have any questions or concerns.    Here is a copy of a draft of our suggestions and other pain information.     PLAN:   We discussed our impressions and the following suggestions/options in detail and  provided him with information in the after visit summary.  Mr. Ladewig had no further questions.    Diagnostic evaluation:   - None at this time.    Rehabilitation:   - Continue kinesiology tape use.  - Continue home exercise program.    Medication suggestions:   - Start diclofenac 75 mg 2x/day. Stop ibuprofen and naproxen. Check in with pain pharmacist in 2-3 weeks.  - If ineffective, consider a prednisone taper x10 days or memantine trial.   - Memantine:  consider a trial of this NMDA antagonist.  Recent reports of a potential benefit in CRPS, neuropathic pain, and headache patients (Sinis N; Birbaumer N; Gustin S; Schwarz A; Bredanger S; [place] S; Unertl K; Schaller H; Woodruff M. Adapted from Clin J Pain. 2007 Mar-Apr;23(3):237-43, Shanthanna H. Memantine: Features and application in the management of chronic pain. InTreatments, Mechanisms, and Adverse Reactions of Anesthetics and Analgesics 2022 Jan 1 (pp. 121-130). Academic Press).  This drug is an NMDA antagonist and has recent evidence to support its use in neuropathic and other pain pain conditions.  Starting dose is 5 mg at night, titrate to 5-10 mg twice a day, typical maximum dose of 30 mg/day.    Procedure suggestions:   - Return for cervical RFA as scheduled.    Follow-up:   - 1 month with me  - 2-3 weeks with pain pharmacist    Please update PainTracker at [email or web address] prior to your next visit to the [facility].   For log in assistance or to reset your PainTracker, please call clinic at [phone], #[phone].

## 2023-07-06 NOTE — Progress Notes (Signed)
 [facility] Follow Up Visit  07/06/2023    Ronnie Mason;  MRN: [id number];   DOB: 1951-06-19        CHIEF COMPLAINT:   Chief Complaint   Patient presents with    Neck Pain       HISTORY OF PRESENT ILLNESS:    The purpose of today's visit is interval reevaluation of cervical neuropathic pain management. The patient has a pain history significant for:  1. Progressive left neck pain with radiation to left lateral upper arm.  Exam and EMG consistent with C5 and C6 radiculopathy. Evaluated by Dr. Welton Flakes on 09/01/22 and determined to be likely candidate for C4-6 ACDF. Undergoing diagnostic MBB at b/l C5-6,C6-7 in effort to delay surgery.  Patient to delay surgery. . Now currently stable, continuing with MBB pathway.  2. Lower back pain multifactorial including lumbar facet arthropathy no longer responsive to mbb pathway, spinal stenosis, DDD. Pain significantly worsening an impacting function.   3. Right hip pain 2/2 severe OA  4. 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     The patient was last seen by Dr. Araceli Bouche on 05/27/2023 at which time we recommended:    IMPRESSION:   Ronnie Mason is a 72 year old male with   1. Progressive left neck pain with radiation to left lateral upper arm.  Exam and EMG consistent with C5 and C6 radiculopathy. Evaluated by Dr. Welton Flakes on 09/01/22 and determined to be likely candidate for C4-6 ACDF. Undergoing diagnostic MBB at b/l C5-6,C6-7 in effort to delay surgery.  Patient to delay surgery. . Now currently stable, continuing with MBB pathway.  2. Lower back pain multifactorial including lumbar facet  arthropathy no longer responsive to mbb pathway, spinal stenosis, DDD. Pain significantly worsening an impacting function.   3. Right hip pain 2/2 severe OA  4. Prior treatment failures including Short duration of benefit for interlaminar lumbar epidural steroids and TFESI, MBB, memantine, Naltrexone.   5. 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  Ronnie Mason had no further questions.     Medication suggestions:   Continue pregabalin 300mg  BID  Continue duloxetine 60mg  BID  Continue lidocaine patch     Procedure suggestions:   Continue with diagnostic MBB #2 to b/l C5-6, C6-7 facet joints as planned     Can consider spinal cord stimulation for lower back and leg symptoms depending on surgical options     Continue with acupuncture      Follow-up: Follow up with Dr. Welton Flakes regarding lower back    Today he is here for a last minute visit to address a flare up in his existing pain. The pain is located primarily in the neck, around C5-C6. He describes his neck pain  as aching, dull, and continuous. He states he "feels a lump and swelling" when he feels the back of his neck but he's not tender to palpation. He is able to massage and use heat/ice the pain away. He says the major impacts are with driving due to reduced mobility, he has to compensate by using the entire torso to move his head. He states looking straight up is strenuous, he can only keep his head at eye level. His wife in the room adds to the  conversation and states he says the pain is "biting him." She says "He was able to pressure wash last week but sice then non-functional, anything beyond the basic self care is not an option and he's worthless around the house."     Sleep is interrupted sometimes due to increased pain with movement.     He feels the overall pain is worsening.    LABS/IMAGING:    None        Current Outpatient Medications   Medication Sig Dispense Refill    Acetaminophen 500 MG Oral Tab Take 3 tablets (1,500 mg) by mouth 2 times a day.      Alpha-Lipoic Acid 300 MG tablet Take 300 mg by mouth 2 times a day.      amLODIPine 5 MG tablet Take 1 tablet (5 mg) by mouth 2 times a day. 180 tablet 1    atorvastatin 10 MG tablet Take 1 tablet (10 mg) by mouth daily. 90 tablet 1    BENFOTIAMINE OR Take 250 mg by mouth every morning.      betamethasone dipropionate 0.05 % lotion APPLY TO SCALP AND EARS TOPICALLY 2 TIMES A DAY AS NEEDED FOR ITCHING. Notify MD of any signs of infection or skin thinning 60 mL 1    Cholecalciferol (VITAMIN D3) 2000 units Oral Cap Take 1 capsule (2,000 Units) by mouth daily. For low level 1 capsule 1    clindamycin 1 % gel Apply 1 Application  topically 2 times a day as needed. Apply to back.      cyanocobalamin (Vitamin B-12) 1000 MCG tablet Take 1 tablet (1,000 mcg) by mouth daily.      diclofenac 1 % gel Apply 4 g topically 4 times a day. Apply to 2 gram four times daily to neck, trapezius muscle, and low back. Use a max of 32 grams a day. 300 g 4    diclofenac sodium 75 MG EC tablet Take 1 tablet (75 mg) by mouth 2 times a day. Take with food. 60 tablet 1    DULoxetine 60 MG DR capsule Take 1 capsule (60 mg) by mouth 2 times a day. 180 capsule 1    EPINEPHrine 0.3 MG/0.3ML auto-injector Inject one pen (0.3mg /mL) into the muscle for severe allergic reaction. Call 911. Use second pen if symptoms continue 2 each 0    lidocaine 5 % patch Apply 1 patch onto the skin daily as needed. Apply to painful area for up to 12  hours in a 24 hour period. 30 patch 3    lisinopril 20 MG tablet Take 1 tablet (20 mg) by mouth 2 times a day. 200 tablet 0    MAGNESIUM OR Take 2 tablets by mouth at bedtime.      mirabegron ER (Myrbetriq) 50 MG 24 hr tablet Take 1 tablet (50 mg) by mouth daily. 100 tablet 3    multivitamin with minerals tablet Take 1 tablet by mouth daily.      naltrexone 50 MG  tablet TAKE 1 TABLET BY MOUTH DAILY WITH FOOD 30 tablet 11    Omega-3 Fatty Acids (OMEGA 3 OR) Take 1 tablet by mouth every morning.      pregabalin 300 MG capsule Take 1 capsule (300 mg) by mouth 2 times a day. 180 capsule 0    primidone 50 MG tablet Take 1 tablet (50 mg) by mouth 2 times a day. 180 tablet 4    Thiamine HCl (VITAMIN B-1 OR) 100 mg.       Current Facility-Administered Medications   Medication Dose Route Frequency Provider Last Rate Last Admin    iohexol (Omnipaque) 240 MG/ML contrast 10 mL  10 mL See Admin Instructions Once Lyda Perone, MD           REVIEW OF SYSTEMS:  The review of systems documented by our clinic staff was provided by the patient. I did review the ROS and have no further detail to add.    PHYSICAL EXAMINATION:  BP 113/72   Pulse 77   Temp 36.2 C (Temporal)   Ht 6\' 4"  (1.93 m)   Wt (!) 102.1 kg (225 lb) Comment: per patient  SpO2 95%   BMI 27.39 kg/m    General:  healthy-appearing  well-nourished  no acute distress  Mental Status:  active  normal mood  normal affect  Alert and oriented x3  Head:  normal appearing  no or minimal tenderness over the nuchal ridge     Cervical spine:   mild pain with flexion  moderate pain with extension  moderate tenderness over the upper facet joints and paraspinous muscles to palpation  Moderate pain with extension       IMPRESSION:   Encounter Diagnoses   Name Primary?    Arthropathy of cervical facet joint     Cervical facet joint syndrome Yes      Ronnie Mason is a 72 year old male with Progressive left neck pain with radiation to left lateral upper arm.  Exam and EMG  consistent with C5 and C6 radiculopathy. Evaluated by Dr. Welton Flakes on 09/01/22 and determined to be likely candidate for C4-6 ACDF. Undergoing diagnostic MBB at b/l C5-6,C6-7 in effort to delay surgery.  Patient to delay surgery. . Now currently stable, continuing with MBB pathway.     He is here today for a last minute appointment due to an increase in his neuropathic cervical pain. He has a RFA scheduled in February with Dr. Dorna Bloom but he needs help managing the pain until then so he can participate in daily activities. He is currently limited with driving and any chores around the house. He's noticed changes to his normal sleep pattern due to the increase in pain. He is currently on the maximum doses of Pregabalin and Duloxetine so the we presented a couple options to cover his pain prior to his procedure. The first option being diclofenac 75 mg BID to take everyday for 2 weeks and then proceed with either every day until the procedure or as needed if the pain is reduced. The second option is a prednisone taper to assist in reducing the inflammation for a flare up. The third option is Memantine which is a medication used specifically for neuropathic pain. Based on his needs to get good pain coverage until his procedure, we recommended starting with diclofenac. He did mention he has taken Diclofenac previously and may have had bruising as a side effect. We scheduled a follow up with the pain pharmacist in 2 weeks  to address any adjustments if any adverse effects come up. If the diclofenac doesn't end up working for any reason, we informed him the prednisone taper or Memantine are  a good back up plan. The patient was educated to stop his ibuprofen and naproxen when starting the diclofenac. A follow up with Christiane Ha in January will address any further needs about his pain management plan prior to his surgery.      PLAN:     We discussed our impressions and the following suggestions/options in detail and  provided him with  information in the after visit summary.  Ronnie Mason had no further questions.    Diagnostic evaluation:   - None at this time.    Rehabilitation:   - Continue kinesiology tape use.  - Continue home exercise program.    Medication suggestions:   - Start diclofenac 75 mg 2x/day. Stop ibuprofen and naproxen. Check in with pain pharmacist in 2-3 weeks.  - If ineffective, consider a prednisone taper x10 days or memantine trial.   - Memantine:  consider a trial of this NMDA antagonist.  Recent reports of a potential benefit in CRPS, neuropathic pain, and headache patients (Sinis N; Birbaumer N; Gustin S; Schwarz A; Bredanger S; Wolcott S; Unertl K; Schaller H; [place] M. Adapted from Clin J Pain. 2007 Mar-Apr;23(3):237-43, Shanthanna H. Memantine: Features and application in the management of chronic pain. InTreatments, Mechanisms, and Adverse Reactions of Anesthetics and Analgesics 2022 Jan 1 (pp. 121-130). Academic Press).  This drug is an NMDA antagonist and has recent evidence to support its use in neuropathic and other pain pain conditions.  Starting dose is 5 mg at night, titrate to 5-10 mg twice a day, typical maximum dose of 30 mg/day.   - A short burst prednisone taper is sometimes indicated for intense flare-ups due to inflammation. A 7 day course is usually recommended as follows:    Days 1-3: Prednisone 20 mg 2x/day    Days 4-6: Prednisone 20 mg 1x/day    Day 7: Prednisone 10 mg 1x/day, then discontinue.    Procedure suggestions:   - Return for cervical RFA as scheduled.    Follow-up:   - 1 month with me  - 2-3 weeks with pain pharmacist      Leland Her, PA-S

## 2023-07-06 NOTE — Progress Notes (Signed)
 I saw and evaluated the patient with Leland Her, PA-S, who conducted the initial history.  I reviewed and confirmed the history in detail with Mr. Maggs in person.   I was present for the examination and formulation portions of the encounter. I agree with the findings and the plan of care as documented in our notes. I did edit the trainee's note.      Jonni Sanger, PA-C  Physician Assistant - Faculty Teaching Associate  [facility] - [facility]  Department of Anesthesiology & Pain Medicine

## 2023-07-06 NOTE — Telephone Encounter (Signed)
 I called pt, no answer. Ldvm. Can this appt be telemedicine or does he need to come in-person? Please advise.

## 2023-07-06 NOTE — Progress Notes (Signed)
Review of Systems   Constitutional: Negative.    HENT: Negative.    Eyes: Negative.    Respiratory: Negative.    Cardiovascular: Negative.    Gastrointestinal: Negative.    Endocrine: Negative.    Genitourinary: Negative.    Musculoskeletal: Positive for arthralgias, back pain, gait problem, myalgias, neck pain and neck stiffness.   Skin: Negative.    Allergic/Immunologic: Negative.    Hematological: Negative.    Psychiatric/Behavioral: Negative.

## 2023-07-16 ENCOUNTER — Ambulatory Visit (INDEPENDENT_AMBULATORY_CARE_PROVIDER_SITE_OTHER): Payer: Medicare PPO | Admitting: Acupuncturist

## 2023-07-16 DIAGNOSIS — G8929 Other chronic pain: Secondary | ICD-10-CM

## 2023-07-16 DIAGNOSIS — M5459 Other low back pain: Secondary | ICD-10-CM

## 2023-07-16 NOTE — Progress Notes (Signed)
 [facility]    Acupuncture Follow-Up Visit  Referred ZO:XWRU, Unk Pinto, MD     PCP: [name], MD    Primary Concern:   Chief Complaint   Patient presents with    Acupuncture     Chronic low back pain     CC1: Chronic Lower back pain  - Location: Bilateral lower back  - Onset; Chronic     Since the last visit  - Now prescribed diclofenac sodium 75 MG EC tablet twice a day, and It has been helpful with both neck and lower back. Noted much less pain  - Noted will have a procedure (at cervical) in February in 2025 to reduce the pain on his spine   - Lower Back Pain 2/10  - Lifting can twisting can still aggravate the pain.  [name] tried to fix the faucet and affected LBP and couldn't move for two days. Pain scale was 8/10 for two days.   - Currently Tylenol is 2000 mg per day (1000 mg morning and 1000 mg in the night) in addition to other pain prescriptions.      Other related MSK issues  Neck and shoulder tension - Today, neck is doing better.   - Neck pain scale: 2/10    Caution: No prone treatment - Korion (05/12/2023) experienced epistaxis during the neck procedure and procedure was postpone due to the epistaxis.   He noted piror to the procedure, the BP was normal range 130's/70's and after the prone position, BP went up 170's/100's.   Adonus had similar epistaxis last year (2023) while he was in the prone position during the acupuncture treatment.     Allergies:  Review of patient's allergies indicates:  Allergies   Allergen Reactions    Adhesives Skin: Rash     Glue on lido patches    Bee Venom Skin: Hives, Skin: Itching and Swelling    Gabapentin Other     Flu like symptoms, diarrhea, body ache upset stomach    Methocarbamol Other     Patient's wife reports cognitive difficulties ("goofy")        Objective:  Pulse  -   Tongue - Red dry tongue with black coating in the center with deep long center crack.     Assessment/Plan:    Hillman  is following up  with acupuncture and [place] medicine for consultation and treatment of:    (M54.50,  G89.29) Chronic bilateral low back pain without sciatica  (primary encounter diagnosis)  .     Diagnosis According to [place] [facility]:  Bone Bi    Treatment Principle/Goals:   Reduce the frequency and severity of current complaint    Acupuncture Treatment:  Verbal consent was obtained from the patient before performing acupuncture and other modalities in this session.     First set of needles: Reclining position on the treatment chair  Mid/Bilateral: Du20, Du24, YT  R: SP6 - taken off soon after due to the pain, Andrei pain subsided after removing the pint  L:  Lumbar, Zero, YNSA D1, GB31, SP9, GB39     Needles in: 10  Needles out: 10    Second set of needles:  L: Lumber, Sympathetic    Needles in: 2  Needles out: 2    Pyonex press needle (0.52mm) were placed at Du14, SJ15 (Ashi), HTJJ L4, L - GB31, GB39. I advised to keep them upto 5 days and take them off. However, if  there is any irritability or discomfort felt, then remove them sooner. I also advised to discard the used one safely.       Post Treatment Note:   Brandonmichael tolerated the all the treatment well without any complications.      Time Spent:   - First set of treatment - Face to face time: 15 mins  - Second set of treatment - Face to face time: 8 mins  Total: Time spent for face-to-face for acupuncture: 23 minutes.    Augusto Gamble, AEMP, LAc   Licensed Acupuncturist, Paramedic for [employer]  Department of Family medicine  [facility]

## 2023-07-20 ENCOUNTER — Encounter (INDEPENDENT_AMBULATORY_CARE_PROVIDER_SITE_OTHER): Payer: Self-pay

## 2023-07-22 NOTE — Progress Notes (Signed)
 CLINICAL PAIN MANAGEMENT PHARMACIST TELEPHONE VISIT    This visit is being conducted over the telephone at the patient's request: Yes  Patient gives verbal consent to proceed and knows there may be a copay/deductible: Yes    Time spent with patient/guardian on this telephone visit: 5 minutes    I am providing medical care to this patient via a telephone visit in place of an in person visit at the request of the patient.    IDENTIFICATION/CHIEF COMPLAINT:   72 year old with progressive left neck pain with radiation to left lateral UE. Exam and EMG consistent with C5 and C6 radiculopathy. Evaluated by Dr. Welton Flakes on 09/01/22 and determined to be likely candidate for C4-6 ACDF. Undergoing diagnostic MBB at b/l C5-6,C6-7 in effort to delay surgery.      REFERRAL/CONSULT:  Jonni Sanger, PA-c  consulted PharmD on 07/06/23 to provide clinical assessment and facilitate  diclofenac  follow up for /indication of  C5 and C6 Radiculopathy     Previous Evaluation by Jonni Sanger, PA-c  on 07/06/23  He has a RFA scheduled in February with Dr. Dorna Bloom but he needs help managing the pain until then so he can participate in daily activities. He is currently limited with driving and any chores around the house. He's noticed changes to his normal sleep pattern due to the increase in pain. He is currently on the maximum doses of Pregabalin and Duloxetine so the we presented a couple options to cover his pain prior to his procedure. The first option being diclofenac 75 mg BID to take everyday for 2 weeks and then proceed with either every day until the procedure or as needed if the pain is reduced. The second option is a prednisone taper to assist in reducing the inflammation for a flare up. The third option is Memantine which is a medication used specifically for neuropathic pain. Based on his needs to get good pain coverage until his procedure, we recommended starting with diclofenac. He did mention he has taken Diclofenac previously and may  have had bruising as a side effect. We scheduled a follow up with the pain pharmacist in 2 weeks to address any adjustments if any adverse effects come up. If the diclofenac doesn't end up working for any reason, we informed him the prednisone taper or Memantine are a good back up plan. The patient was educated to stop his ibuprofen and naproxen when starting the diclofenac.     Current Analgesic Regimen:   Diclofenac 75 mg BID, started 07/08/23    ----  Duloxetine 60 mg BID   Pregabalin 300 mg BID     Prior Medication Trial(s)  Max Dose (Duration) Timeline Pain Efficacy (Y/N/Unknown) Side Effects Reason for discontinuation                             [place] PMP Medication Dispense History (from 07/27/2022 to 07/16/2023)     Dispensed Days Supply Quantity   PREGABALIN 300 MG CAPSULE 05/31/2023 90 180 unspecified   PREGABALIN 300 MG CAPSULE 02/27/2023 90 180 unspecified   PREGABALIN 300 MG CAPSULE 12/01/2022 90 180 unspecified   PREGABALIN 300 MG CAPSULE 08/25/2022 90 180 unspecified     I reviewed the [place] state prescription drug monitoring program and it is consistent with Pain Clinic prescriptions and known controlled substance prescriptions.     SUBJECTIVE:  Patient reports starting diclofenac 75 mg BID. Pain nearly resolved after 48 hours on diclofenac.  Patient denies any GI side effects and headaches. Per patient plan is to continue diclofenac until he is able to have RFAs in 09/2023.    In terms of function able to complete ADLs and household chores.    OBJECTIVE:    Labs: (last year)    BMP  CBC/Coags   Na 140 02/01/2023  Hb 14.8 02/01/2023   K 4.2 02/01/2023  HCT 45 02/01/2023   Cl 101 02/01/2023  WBC 4.66 02/01/2023   HCO3 30 02/01/2023  PLT 201 02/01/2023   BUN 18 02/01/2023  INR 0.9 01/31/2023   Cr 0.94 02/01/2023  PT 12.8 01/31/2023   Glu 81 02/01/2023  PTT 28 01/31/2023       Misc   eGFR >60 02/01/2023  MCV 104 (H) 02/01/2023   A1C - -  BNP 85 01/31/2023       LFTs   AST 33 01/31/2023  Albumin 3.8 01/31/2023   ALT 28 01/31/2023  Protein 6.0  01/31/2023   Alk Phos 64 01/31/2023  T Bili 0.2 01/31/2023       ASSESSMENT/PLAN:  The patient is currently taking  diclofenac  for indication of  C5 and C6 Radiculopathy . Pain is adequately controlled, without side effects. As pain has nearly resolved with BID dosing, I would recommend patient trialing 75 mg daily. If pain and function are compromised patient can increase diclofenac back to 75 mg BID. We discussed the intention of diclofenac as short term until RFA, patient confirmed plan for RFA in 09/2023.     Pharmacist Follow-up: as needed  Next Provider Follow-up: Shayne Alken, [place] 08/18/23    Prescriptions Written during Encounter:   No orders of the defined types were placed in this encounter.       I called and spoke to the patient over the telephone for this encounter. I spent a total of 5 minutes for the patient's care on the date of the service.

## 2023-07-23 ENCOUNTER — Ambulatory Visit: Payer: Medicare PPO | Admitting: Pharmacist

## 2023-07-23 ENCOUNTER — Ambulatory Visit (INDEPENDENT_AMBULATORY_CARE_PROVIDER_SITE_OTHER): Payer: Medicare PPO | Admitting: Acupuncturist

## 2023-07-23 DIAGNOSIS — G8929 Other chronic pain: Secondary | ICD-10-CM

## 2023-07-23 DIAGNOSIS — Z79899 Other long term (current) drug therapy: Secondary | ICD-10-CM

## 2023-07-23 DIAGNOSIS — M5459 Other low back pain: Secondary | ICD-10-CM

## 2023-07-23 DIAGNOSIS — M545 Low back pain, unspecified: Secondary | ICD-10-CM

## 2023-07-23 NOTE — Progress Notes (Signed)
 Ronnie Mason    Acupuncture Follow-Up Visit    Referred by: Referred ZO:XWRU, Ronnie Pinto, MD     PCP: Ronnie Medal, MD    Primary Concern:   Chief Complaint   Patient presents with    Acupuncture     Chronic Lower back pain     CC1: Chronic Lower back pain  - Location: Bilateral lower back  - Onset; Chronic     Since the last visit:   - Noted back feeling better since he started diclofenac.   - Today, he will have a phone call with his pharmacy at 2 pm regarding the diclofef and will discuss how he is doing  - Lower back pain 2/10, hasn't had any flare since last week and noted he try avoid doing the fixing of the home which could trigger his lower back pain   - Today, Ronnie Mason is carrying a cane just incase, and no issue walking around.  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       Other MSK  - Neck continues to feel better (2/10 pain scale today)    Note from previous visit  - Now prescribed diclofenac sodium 75 MG EC tablet twice a day, and It has been helpful with both neck and lower back. Noted much less pain  - Noted will have a procedure (at cervical) in February in 2025 to reduce the pain on his spine   - Lower Back Pain 2/10  - Lifting can twisting can still aggravate the pain.  Ronnie Mason tried to fix the faucet and affected LBP and couldn't move for two days. Pain scale was 8/10 for two days.   - Currently Tylenol is 2000 mg per day (1000 mg morning and 1000 mg in the night) in addition to other pain prescriptions.       Caution: No prone treatment Ronnie Mason (05/12/2023) experienced epistaxis during the neck procedure and procedure was postpone due to the epistaxis.   He noted piror to the procedure, the BP was normal range 130's/70's and after the prone position, BP went up 170's/100's.   Ronnie Mason had similar epistaxis last year (2023) while he was in the prone position during the acupuncture treatment.         Allergies:  Review of patient's  allergies indicates:  Allergies   Allergen Reactions    Adhesives Skin: Rash     Glue on lido patches    Bee Venom Skin: Hives, Skin: Itching and Swelling    Gabapentin Other     Flu like symptoms, diarrhea, body ache upset stomach    Methocarbamol Other     Patient's wife reports cognitive difficulties ("goofy")      Objective:  Pulse  -  Deferred.  Tongue - Deferred.    Assessment/Plan:    Ronnie Mason  is following up with acupuncture and Ronnie Mason medicine for consultation and treatment of:    (M54.50,  G89.29) Chronic bilateral low back pain without sciatica  (primary encounter diagnosis)  .     Diagnosis According to Ronnie Mason [facility]:  Bone bi, and yangming heat      Treatment Principle/Goals:   Reduce the frequency and severity of current complaint and Minimize the risk of progression of current complaint    Acupuncture Treatment:  Verbal consent was obtained from the patient before performing acupuncture and other modalities in this session.     First set of needles: Reclining position with 2  pillows behind the neck and 2 pillows, 1 pillow bellow the knee  Mid/Bilateral: Du20, YT(removed soon after due to the discomfort)  R: shemne, Lumber, neck, san cha asn   L: GB31, G34, YNSA D1    Needles in: 9  Needles out: 9    Second set of needles:Reclining position with 2 pillows behind the neck and 2 pillows, 1 pillow bellow the knee  Mid/Bilateral: Ba Feng (2), GB39  R: YNSA D1    Needles in: 5  Needles out: 5    I explained the possible side effect with     Post Treatment Note:   Ronnie Mason tolerated the all the treatment well without any complications.      Time Spent:   - First set of treatment - Face to face time: 15 mins  - Second set of treatment - Face to face time: 8 mins  Total: Time spent for face-to-face for acupuncture: 23 minutes.    Ronnie Mason, AEMP, LAc   Licensed Acupuncturist, Paramedic for [employer]  Department of Family medicine  [facility] [place]

## 2023-07-27 ENCOUNTER — Other Ambulatory Visit (INDEPENDENT_AMBULATORY_CARE_PROVIDER_SITE_OTHER): Payer: Self-pay | Admitting: Internal Medicine

## 2023-07-27 DIAGNOSIS — I10 Essential (primary) hypertension: Secondary | ICD-10-CM

## 2023-07-29 MED ORDER — LISINOPRIL 20 MG OR TABS
20.0000 mg | ORAL_TABLET | Freq: Two times a day (BID) | ORAL | 1 refills | Status: DC
Start: 2023-07-29 — End: 2024-02-15

## 2023-07-30 ENCOUNTER — Ambulatory Visit (INDEPENDENT_AMBULATORY_CARE_PROVIDER_SITE_OTHER): Payer: Medicare PPO | Admitting: Acupuncturist

## 2023-07-30 DIAGNOSIS — G8929 Other chronic pain: Secondary | ICD-10-CM

## 2023-07-30 DIAGNOSIS — M5459 Other low back pain: Secondary | ICD-10-CM

## 2023-07-30 NOTE — Progress Notes (Signed)
 [facility]    Acupuncture Follow-Up Visit    Referred by: Drexel Iha, MD     PCP: [name], MD    Primary Concern: No chief complaint on file.    CC1: Chronic Lower back pain  - Location: Bilateral lower back  - Onset; Chronic      Note from the last visit:   - Noted back feeling better since he started diclofenac. They suggested to cut back to pills to one per day instead of 2 pills. However, after two days later cutting back, the lower back pain came back quickly and needed to go back 2 pills a day .  - After going back to 2 pills, pain is less again, pain is under the control and noted he is in good shape today  - Pain scale 2/10 for both lower back and neck pain   - During the past week, no flare asides for the time with lower dosage of ddiclofenac sodium  - During the Christmas shopping, Yeicob noticed that he can not able to walk more than a half mile without lower back starts tightening up and need to rest.   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       Other MSK  - Neck pain(2/10 pain scale today)     Note from previous visit  - Now prescribed diclofenac sodium 75 MG EC tablet twice a day, and It has been helpful with both neck and lower back. Noted much less pain  - Noted will have a procedure (at cervical) in February in 2025 to reduce the pain on his spine   - Lower Back Pain 2/10  - Lifting can twisting can still aggravate the pain.  [name] tried to fix the faucet and affected LBP and couldn't move for two days. Pain scale was 8/10 for two days.   - Currently Tylenol is 2000 mg per day (1000 mg morning and 1000 mg in the night) in addition to other pain prescriptions.       Caution: No prone treatment Arnette Lanoux (05/12/2023) experienced epistaxis during the neck procedure and procedure was postpone due to the epistaxis.   He noted piror to the procedure, the BP was normal range 130's/70's and after the prone position, BP went up  170's/100's.   Yonic had similar epistaxis last year (2023) while he was in the prone position during the acupuncture treatment.     Allergies:  Review of patient's allergies indicates:  Allergies   Allergen Reactions    Adhesives Skin: Rash     Glue on lido patches    Bee Venom Skin: Hives, Skin: Itching and Swelling    Gabapentin Other     Flu like symptoms, diarrhea, body ache upset stomach    Methocarbamol Other     Patient's wife reports cognitive difficulties ("goofy")          Objective:  Pulse  - Deffered  Tongue - Red small tongue body with black coating in the center     Assessment/Plan:    Dominyc  is following up with acupuncture and [place] medicine for consultation and treatment of:    (M54.50,  G89.29) Chronic bilateral low back pain without sciatica  (primary encounter diagnosis)  .     Diagnosis According to [place] [facility]:  Bone Bi    Treatment Principle/Goals:   Reduce the frequency and severity of current complaint and Minimize the risk of  progression of current complaint    Acupuncture Treatment:  Verbal consent was obtained from the patient before performing acupuncture and other modalities in this session.     First set of needles: Seating position on the treatment table  Mid/Bilateral: Zu Jiu Li (2), Ba Feng (2),   R: Shenmen, Sympathetic, LU, LV, Zhu Jiu Li (1)     Needles in: 13  Needles out: 13    Second set of needles: Seating position on the treatment table  L: YNSA D1, D2    Needles in: 2  Needles out: 2    Pyonex press needle (0.6mm) were placed at Bilateral GB31, Du14, UB23, L - GB12, GB20, Lumbar, Neck. I advised to keep them upto 5 days and take them off. However, if there is any irritability or discomfort felt, then remove them sooner. I also advised to discard the used one safely.       Post Treatment Note:   Adahir tolerated the all the treatment well without any complications.      I provided the information sheet on the affordable acupuncture treatment information, along with  private clinics around the [facility] area.    Time Spent:   - First set of treatment - Face to face time: 15 mins  - Second set of treatment - Face to face time: 8 mins  Total: Time spent for face-to-face for acupuncture: 23 minutes.    Augusto Gamble, AEMP, LAc   Licensed Acupuncturist, Paramedic for [employer]  Department of Family medicine  [facility] [facility] [place]

## 2023-08-06 ENCOUNTER — Ambulatory Visit (INDEPENDENT_AMBULATORY_CARE_PROVIDER_SITE_OTHER): Payer: Medicare PPO | Admitting: Acupuncturist

## 2023-08-06 ENCOUNTER — Encounter (INDEPENDENT_AMBULATORY_CARE_PROVIDER_SITE_OTHER): Payer: Self-pay

## 2023-08-09 ENCOUNTER — Encounter (HOSPITAL_BASED_OUTPATIENT_CLINIC_OR_DEPARTMENT_OTHER): Payer: Self-pay

## 2023-08-13 ENCOUNTER — Ambulatory Visit (INDEPENDENT_AMBULATORY_CARE_PROVIDER_SITE_OTHER): Payer: Medicare PPO | Admitting: Acupuncturist

## 2023-08-17 NOTE — Progress Notes (Signed)
 [facility] Follow Up Visit  08/18/2023    Ronnie Mason;  MRN: [id number];   DOB: [birth year]    Chief Complaint   Patient presents with    Follow-Up      1 month        History of Present Illness:  Ronnie Mason was last seen by Dorn Fox on 07/06/23 for a follow-up visit.  From that visit:   Ronnie Mason is a 73 year old male with Progressive left neck pain with radiation to left lateral upper arm.  Exam and EMG consistent with C5 and C6 radiculopathy. Evaluated by Dr. Fernand on 09/01/22 and determined to be likely candidate for C4-6 ACDF. Undergoing diagnostic MBB at b/l C5-6,C6-7 in effort to delay surgery.  Patient to delay surgery. . Now currently stable, continuing with MBB pathway.      He is here today for a last minute appointment due to an increase in his neuropathic cervical pain. He has a RFA scheduled in February with Dr. Alena but he needs help managing the pain until then so he can participate in daily activities. He is currently limited with driving and any chores around the house. He's noticed changes to his normal sleep pattern due to the increase in pain. He is currently on the maximum doses of Pregabalin  and Duloxetine  so the we presented a couple options to cover his pain prior to his procedure. The first option being diclofenac  75 mg BID to take everyday for 2 weeks and then proceed with either every day until the procedure or as needed if the pain is reduced. The second option is a prednisone  taper to assist in reducing the inflammation for a flare up. The third option is Memantine  which is a medication used specifically for neuropathic pain. Based on his needs to get good pain coverage until his procedure, we recommended starting with diclofenac . He did mention he has taken Diclofenac  previously and may have had bruising as a side effect. We scheduled a follow up with the pain pharmacist in 2 weeks to address any adjustments if any adverse effects come up. If the diclofenac   doesn't end up working for any reason, we informed him the prednisone  taper or Memantine  are  a good back up plan. The patient was educated to stop his ibuprofen and naproxen  when starting the diclofenac . A follow up with Dorn in January will address any further needs about his pain management plan prior to his surgery.        PLAN:      We discussed our impressions and the following suggestions/options in detail and  provided him with information in the after visit summary.  Ronnie Mason had no further questions.     Diagnostic evaluation:   - None at this time.     Rehabilitation:   - Continue kinesiology tape use.  - Continue home exercise program.     Medication suggestions:   - Start diclofenac  75 mg 2x/day. Stop ibuprofen and naproxen . Check in with pain pharmacist in 2-3 weeks.  - If ineffective, consider a prednisone  taper x10 days or memantine  trial.              - Memantine :  consider a trial of this NMDA antagonist.   This drug is an NMDA antagonist and has recent evidence to support its use in neuropathic and other pain pain conditions.  Starting dose is 5 mg at night, titrate to 5-10 mg twice a day, typical  maximum dose of 30 mg/day.              - A short burst prednisone  taper is sometimes indicated for intense flare-ups due to inflammation. A 7 day course is usually recommended as follows:                          Days 1-3: Prednisone  20 mg 2x/day                          Days 4-6: Prednisone  20 mg 1x/day                          Day 7: Prednisone  10 mg 1x/day, then discontinue.     Procedure suggestions:   - Return for cervical RFA as scheduled.     Follow-up:   - 1 month with me  - 2-3 weeks with pain pharmacist    Phone visit with pain pharmacist 07/23/23:  ASSESSMENT/PLAN:  The patient is currently taking  diclofenac   for indication of  C5 and C6 Radiculopathy . Pain is adequately controlled, without side effects. As pain has nearly resolved with BID dosing, I would recommend patient trialing  75 mg daily. If pain and function are compromised patient can increase diclofenac  back to 75 mg BID. We discussed the intention of diclofenac  as short term until RFA, patient confirmed plan for RFA in 09/2023.       He is here today for a follow up visit.      Issues that Ronnie Mason would like to address today include:    Pain med follow-up   Diclofenac  seems be working well    The pain is located primarily in the neck.  Other pain locations include back.  He feels the overall pain is  gradually improving.  The pain is made better by diclofenac .      Current medications related to pain and related conditions include:  Diclofenac  bid    Problem list:   Patient Active Problem List   Diagnosis    Syncope    Hypotension    Carotid Sinus Hypersensitivity    URI (upper respiratory infection)    Cervicalgia    Hyperlipidemia    Chronic pain    Bee sting allergy    Numbness and tingling of leg    Chronic back pain    B12 deficiency    Chronic renal insufficiency, stage III (moderate) (HCC)    Back pain, lumbosacral    Lumbar radiculopathy, chronic    Neck pain    Fall    Tremor    Sensory neuropathy    Cervical facet joint syndrome    Idiopathic peripheral neuropathy    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)    History of alcohol  dependence (HCC)    Cerebral ventriculomegaly    Cerebellar hypoplasia (HCC)    Essential hypertension    Essential tremor    Hx of spontan intraparenchymal intracran bleed assoc with hypertension    Alcohol  abuse    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)    Balance problem    Chronic pain of both shoulders    Vitamin D  deficiency    Difficulty in walking, not elsewhere classified    Impaired mobility and ADLs    History of traumatic brain injury    Primary osteoarthritis of right hip    Mild vascular neurocognitive  disorder    Right lumbar radiculitis    Greater trochanteric pain syndrome of right lower extremity    Complex tear of medial meniscus of left knee as  current injury    Primary osteoarthritis of left knee    Acute left-sided low back pain without sciatica    Chronic left-sided low back pain without sciatica    Enuresis    Closed fracture of one rib of right side    MRSA (methicillin resistant staph aureus) culture positive    Myofascial pain    Macrocytosis without anaemia    Psychological factors affecting medical condition    Chronic bilateral low back pain without sciatica    Mallet toe, right    Neurogenic bladder    BPH with obstruction/lower urinary tract symptoms    Urge incontinence    Cerebral microvascular disease    Mixed incontinence    Nasal bleeding    Secondary hypertension        Past Medical History:   Diagnosis Date    Cellulitis and abscess of foot 12/02/2020    Added automatically from request for surgery [id number]    Vitamin D  deficiency 02/13/2016    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    Idiopathic peripheral neuropathy 03/15/2015    Spondylosis of cervical region without myelopathy or radiculopathy 05/28/2014    Back pain, lumbosacral 02/15/2013    B12 deficiency 12/21/2012    Borderline low 5 /21/2014    Chronic renal insufficiency, stage III (moderate) (HCC) 12/21/2012    5/ 2013    Hyperlipidemia 09/26/2012    Cervicalgia 03/11/2010    Radiofrequency ablation of c3 to c 7    Allergic rhinitis due to allergen     Carotid Sinus Hypersensitivity     Chronic pain     chronic LUE pain had injection.    Disc disorder of lumbosacral region     Hip injury     HYPERTENSION      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[facility])      Past Surgical History:   Procedure Laterality Date    L meniciscus Left 07/2017    by Dr Johnnye , re did left menisicus     PR ANES; COLONOSCOPY  2002    repeat in 3 years    PR ANES; COLONOSCOPY &  POLYPECTOMY  11/18/2007    repeat in 3 years    PR CORRECTION HAMMERTOE Right 05/13/2022    Dr. Koren Cramp, DPM    PR HALLUX RIGIDUS W/CHEILECTOMY 1ST MP JT W/O IMPLT Right 10/21/2017    Dr. Redell Rhein    PR UNLISTED PROCEDURE FEMUR/KNEE      PR UNLISTED PROCEDURE HANDS/FINGERS      PR UNLISTED PROCEDURE SPINE  2013    rfa to c3 to c 7     SURGICAL HX OTHER Right 12/03/2020    RIGHT foot WOUND INCISION AND DRAINAGE    TOE SURGERY Right 02/09/2019    toe surgery  Right 01/12/2019    Dr Koren Cramp      Family History       Problem (# of Occurrences) Relation (Name,Age of Onset)    Heart (other) (1) Mother: MI    Other Family Hx (1) Other: myelodysplasia    Colon  Cancer (1) Father    Suicide Attempt (1) Brother: died of suicide           Negative family history of: Cancer          Social History     Tobacco Use    Smoking status: Never    Smokeless tobacco: Never    Tobacco comments:     Sometimes chews, but is trying to quit. 11/28/21   Substance Use Topics    Alcohol  use: Yes     Alcohol /week: 3.0 standard drinks of alcohol      Types: 3 Glasses of wine per week     Comment: goal to get down to 2 per day     Past medical, surgical, family, and social history was  reviewed and updated personally with Mr. Hillis.  He reports no other change in past medical history, past surgical history, family history, or social history since his  last visit except as noted above.  Medications and allergies were also reviewed and confirmed personally.  Current Outpatient Medications   Medication Sig Dispense Refill    Acetaminophen  500 MG Oral Tab Take 3 tablets (1,500 mg) by mouth 2 times a day.      Alpha-Lipoic Acid 300 MG tablet Take 300 mg by mouth 2 times a day.      amLODIPine  5 MG tablet Take 1 tablet (5 mg) by mouth 2 times a day. 180 tablet 1    atorvastatin  10 MG tablet Take 1 tablet (10 mg) by mouth daily. 90 tablet 1    BENFOTIAMINE OR Take 250 mg by mouth every morning.      betamethasone  dipropionate 0.05 % lotion APPLY TO  SCALP AND EARS TOPICALLY 2 TIMES A DAY AS NEEDED FOR ITCHING. Notify MD of any signs of infection or skin thinning 60 mL 1    Cholecalciferol (VITAMIN D3) 2000 units Oral Cap Take 1 capsule (2,000 Units) by mouth daily. For low level 1 capsule 1    clindamycin 1 % gel Apply 1 Application  topically 2 times a day as needed. Apply to back.      cyanocobalamin  (Vitamin B-12) 1000 MCG tablet Take 1 tablet (1,000 mcg) by mouth daily.      diclofenac  1 % gel Apply 4 g topically 4 times a day. Apply to 2 gram four times daily to neck, trapezius muscle, and low back. Use a max of 32 grams a day. 300 g 4    diclofenac  sodium 75 MG EC tablet Take 1 tablet (75 mg) by mouth 2 times a day. Take with food. 60 tablet 1    DULoxetine  60 MG DR capsule Take 1 capsule (60 mg) by mouth 2 times a day. 180 capsule 1    EPINEPHrine  0.3 MG/0.3ML auto-injector Inject one pen (0.3mg /mL) into the muscle for severe allergic reaction. Call 911. Use second pen if symptoms continue 2 each 0    lidocaine  5 % patch Apply 1 patch onto the skin daily as needed. Apply to painful area for up to 12 hours in a 24 hour period. 30 patch 3    lisinopril  20 MG tablet TAKE 1 TABLET BY MOUTH TWICE DAILY 200 tablet 1    MAGNESIUM  OR Take 2 tablets by mouth at bedtime.      mirabegron  ER (Myrbetriq ) 50 MG 24 hr tablet Take 1 tablet (50 mg) by mouth daily. 100 tablet 3    multivitamin with minerals tablet Take 1 tablet by mouth daily.  naltrexone  50 MG tablet TAKE 1 TABLET BY MOUTH DAILY WITH FOOD 30 tablet 11    Omega-3 Fatty Acids (OMEGA 3 OR) Take 1 tablet by mouth every morning.      pregabalin  300 MG capsule Take 1 capsule (300 mg) by mouth 2 times a day. 180 capsule 0    primidone  50 MG tablet Take 1 tablet (50 mg) by mouth 2 times a day. 180 tablet 4    Thiamine  HCl (VITAMIN B-1 OR) 100 mg.       Current Facility-Administered Medications   Medication Dose Route Frequency Provider Last Rate Last Admin    iohexol  (Omnipaque ) 240 MG/ML contrast 10 mL  10  mL See Admin Instructions Once Erminio Albright, MD           Review Of Systems  The Review of Systems was provided by Ronnie Mason and entered in the [place] note associated with this encounter.  I did personally reviewed and confirmed the ROS information with Ronnie Mason and have no further detail to add.    Physical Examination: BP (!) 142/83   Pulse 91   Temp 36 C (Tympanic)   Ht 6' 4 (1.93 m)   Wt (!) 102.1 kg (225 lb)   SpO2 95%   BMI 27.39 kg/m    General: healthy, alert, no distress  Psychiatric  Judgement/insight: Normal;  Orientation: Normal..Oriented , interactive.   Spine:  The spine is straight, nontender, without kyphosis or scoliosis.  Normal range of motion.  Gait: normal.    Assessment/Discussion  (Z79.899) Medication management  (primary encounter diagnosis)  (F52.187) Cervical facet joint syndrome  (M54.2) Cervicalgia    Ronnie Mason is a 73 year old male with progressive left neck pain. He was evaluated by Dr. Fernand on 09/01/22 and determined to be likely candidate for C4-6 ACDF.    He is scheduled for RFA of bilateral C5-6, C6-7 facet joints (left side first) on 09/08/2023 and 09/15/2023 with Dr. Alena. Diclofenac  has been helpful for keeping his pain down at a manageable level. We will plan for him to continue this until his procedure and reevaluate for needed continuation.    Plan:      Medication suggestions:   - Continue diclofenac  75 mg BID.    Procedure suggestions:   - Return for bilateral C5-6, C6-7 facet joints cooled RFA.    Follow-up:   - As scheduled in February for procedures.      Therisa Seip PA-S

## 2023-08-18 ENCOUNTER — Ambulatory Visit: Payer: Medicare PPO | Attending: Medical | Admitting: Medical

## 2023-08-18 VITALS — BP 142/83 | HR 91 | Temp 96.8°F | Ht 76.0 in | Wt 225.0 lb

## 2023-08-18 DIAGNOSIS — Z79899 Other long term (current) drug therapy: Secondary | ICD-10-CM | POA: Insufficient documentation

## 2023-08-18 DIAGNOSIS — M47812 Spondylosis without myelopathy or radiculopathy, cervical region: Secondary | ICD-10-CM | POA: Insufficient documentation

## 2023-08-18 DIAGNOSIS — M542 Cervicalgia: Secondary | ICD-10-CM | POA: Insufficient documentation

## 2023-08-18 NOTE — Progress Notes (Signed)
 I saw and evaluated the patient with Gaetano Net, PA-S, who conducted the initial history.  I reviewed and confirmed the history in detail with Mr. Aguayo in person.   I was present for the examination and formulation portions of the encounter. I agree with the findings and the plan of care as documented in our notes. I did edit the trainee's note.    I spent a total of 15 minutes for the patient's care on the date of the service.          Jonni Sanger, PA-C  Physician Assistant - Faculty Teaching Associate  [facility]  Department of Anesthesiology & Pain Medicine

## 2023-08-18 NOTE — Progress Notes (Signed)
Review of Systems   Constitutional: Negative.    HENT: Negative.    Eyes: Negative.    Respiratory: Negative.    Cardiovascular: Negative.    Gastrointestinal: Negative.    Endocrine: Negative.    Genitourinary: Negative.    Musculoskeletal: Positive for arthralgias, back pain, gait problem, myalgias, neck pain and neck stiffness.   Skin: Negative.    Allergic/Immunologic: Negative.    Hematological: Negative.    Psychiatric/Behavioral: Negative.

## 2023-08-20 ENCOUNTER — Ambulatory Visit (INDEPENDENT_AMBULATORY_CARE_PROVIDER_SITE_OTHER): Payer: Medicare PPO | Admitting: Acupuncturist

## 2023-08-27 ENCOUNTER — Ambulatory Visit (INDEPENDENT_AMBULATORY_CARE_PROVIDER_SITE_OTHER): Payer: Medicare PPO | Admitting: Acupuncturist

## 2023-08-29 ENCOUNTER — Other Ambulatory Visit (HOSPITAL_BASED_OUTPATIENT_CLINIC_OR_DEPARTMENT_OTHER): Payer: Self-pay | Admitting: Medical

## 2023-08-29 DIAGNOSIS — M47812 Spondylosis without myelopathy or radiculopathy, cervical region: Secondary | ICD-10-CM

## 2023-08-30 MED ORDER — DICLOFENAC SODIUM 75 MG OR TBEC
75.0000 mg | DELAYED_RELEASE_TABLET | Freq: Two times a day (BID) | ORAL | 11 refills | Status: DC
Start: 2023-08-30 — End: 2024-05-18

## 2023-08-30 NOTE — Telephone Encounter (Signed)
Requested Medication: diclofenac sodium 75 MG EC tablet   Requested by: pharmacy    Last refill written: 07/06/23  Written by: Shayne Alken    Last appointment: 08/18/2023 with Dr. Shayne Alken  Scheduled follow up: 09/08/2023 with Dr. Olivia Canter PMP Medication Dispense History (from 09/04/2022 to 08/29/2023)     Dispensed Days Supply Quantity   PREGABALIN 300 MG CAPSULE 05/31/2023 90 180 unspecified   PREGABALIN 300 MG CAPSULE 02/27/2023 90 180 unspecified   PREGABALIN 300 MG CAPSULE 12/01/2022 90 180 unspecified      diclofenac sodium 75 MG EC tablet [[id number]]    Order Details  Dose: 75 mg Route: Oral Frequency: 2 times daily   Dispense Quantity: 60 tablet Refills: 1          Sig: Take 1 tablet (75 mg) by mouth 2 times a day. Take with food.         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

## 2023-09-03 ENCOUNTER — Ambulatory Visit (INDEPENDENT_AMBULATORY_CARE_PROVIDER_SITE_OTHER): Payer: Medicare PPO | Admitting: Acupuncturist

## 2023-09-03 ENCOUNTER — Ambulatory Visit (INDEPENDENT_AMBULATORY_CARE_PROVIDER_SITE_OTHER): Payer: Medicare PPO | Admitting: Podiatrist

## 2023-09-03 DIAGNOSIS — M722 Plantar fascial fibromatosis: Secondary | ICD-10-CM

## 2023-09-03 MED ORDER — TRIAMCINOLONE ACETONIDE 10 MG/ML IJ SUSP
2.5000 mg | Freq: Once | INTRAMUSCULAR | Status: AC
Start: 2023-09-03 — End: 2023-09-03
  Administered 2023-09-03: 2.5 mg via INTRA_ARTICULAR

## 2023-09-03 NOTE — Progress Notes (Signed)
Ronnie Mason is a 73 year old year old male who presents today for re-evaluation of Mallet toe, right .    The patient states of improvement, he is reports soreness on the incision site   The current condition has existed for 4 year(s).    Patient describes the symptoms as pain with activity, pain with long standing, pain with pressure, and stiffness.     The level the pain/severity of pain is 6/10.   The condition is worse when walking.     Current treatments/recent studies include  tylenol .     The response to the treatment is temporary relief.     Patient's activity limitation is limited in walking, limited in recreation, and limits activities of daily living.   Goals: evaluate current condition.

## 2023-09-03 NOTE — Progress Notes (Signed)
Patient: Ronnie Mason   Patient DOB: January 11, 1951     DOS:  09/03/2023     Accompanied by:  patient was unaccompanied    Chief Complaint: Ronnie Mason is a 73 year old year old male who is former patient presents today with new complaint(s): right arch pain    History of present Illness:    (history is obtained by MA/RN and edited by [name])  Ronnie Mason is a 73 year old year old male who presents s/p h/o I & D of abscess on right plantar arch 12/2020.  He is reports soreness on the incision site   The current condition has existed for 4 year(s).    Patient describes the symptoms as pain with activity, pain with long standing, pain with pressure, and stiffness.     The level the pain/severity of pain is 6/10.   The condition is worse when walking.     Current treatments/recent studies include  tylenol, wears Quickstride insert with plantar fascia groove   The response to the treatment is temporary relief.     Patient's activity limitation is limited in walking, limited in recreation, and limits activities of daily living.   Goals: evaluate current condition.       PCP:  [name], MD     PMH:   Active Ambulatory Problems     Diagnosis Date Noted    Syncope     Hypotension     Carotid Sinus Hypersensitivity     URI (upper respiratory infection)     Cervicalgia 03/11/2010    Hyperlipidemia 09/26/2012    Chronic pain 12/21/2012    Bee sting allergy 12/21/2012    Numbness and tingling of leg 12/21/2012    Chronic back pain 12/21/2012    B12 deficiency 12/21/2012    Chronic renal insufficiency, stage III (moderate) (HCC) 12/21/2012    Back pain, lumbosacral 02/15/2013    Lumbar radiculopathy, chronic 02/15/2013    Neck pain 02/15/2013    Fall 02/23/2013    Tremor 04/20/2013    Sensory neuropathy 01/18/2014    Cervical facet joint syndrome 05/28/2014    Idiopathic peripheral neuropathy 03/15/2015    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 [facility]) 05/07/2015    History of alcohol dependence (HCC) 06/26/2015     Cerebral ventriculomegaly 10/09/2015    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    Essential hypertension 12/16/2015    Essential tremor 12/16/2015    Hx of spontan intraparenchymal intracran bleed assoc with hypertension 12/16/2015    Alcohol abuse 12/16/2015    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    Balance problem 01/23/2016    Chronic pain of both shoulders 01/23/2016    Vitamin D deficiency 02/13/2016    Difficulty in walking, not elsewhere classified 03/12/2016    Impaired mobility and ADLs 04/08/2016    History of traumatic brain injury 08/06/2016    Primary osteoarthritis of right hip 01/11/2017    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    Right lumbar radiculitis 05/05/2017    Greater trochanteric pain syndrome of right lower extremity 11/25/2017    Complex tear of medial meniscus of left knee as current injury 09/08/2017    Primary osteoarthritis of left knee 09/08/2017    Acute left-sided low back pain without sciatica 03/14/2018    Chronic left-sided low back pain without sciatica 06/21/2018    Enuresis 09/06/2018    Closed fracture of one rib of right side 01/26/2019    MRSA (  methicillin resistant staph aureus) culture positive 07/13/2019    Myofascial pain 10/20/2019    Macrocytosis without anaemia 02/12/2020    Psychological factors affecting medical condition 04/24/2021    Chronic bilateral low back pain without sciatica 04/29/2021    Mallet toe, right 04/23/2022    Neurogenic bladder 07/17/2022    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    Urge incontinence 07/17/2022    Cerebral microvascular disease 10/01/2022    Mixed incontinence 10/01/2022    Nasal bleeding 05/12/2023    Secondary hypertension 05/12/2023     Resolved Ambulatory Problems     Diagnosis Date Noted    Lumbosacral radiculopathy at S1 03/22/2013    Intraparenchymal hemorrhage of brain (HCC) 01/06/2016    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) 01/15/2016     Pain of right hip joint 01/23/2016    Alcoholism in remission (HCC) 03/16/2019    Abscess of foot 12/02/2020    Cellulitis and abscess of foot 12/02/2020    Abscess 12/02/2020    Abscess of right foot 12/10/2020     Past Medical History:   Diagnosis Date    Allergic rhinitis due to allergen     Disc disorder of lumbosacral region     Hip injury     HYPERTENSION      Spondylosis of cervical region without myelopathy or radiculopathy 05/28/2014    Stroke [facility]) 2017    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[facility])         Review of patient's allergies indicates:  Allergies   Allergen Reactions    Adhesives Skin: Rash     Glue on lido patches    Bee Venom Skin: Hives, Skin: Itching and Swelling    Gabapentin Other     Flu like symptoms, diarrhea, body ache upset stomach    Methocarbamol Other     Patient's wife reports cognitive difficulties ("goofy")        Medications:   Outpatient Medications Prior to Visit   Medication Sig Dispense Refill    Acetaminophen 500 MG Oral Tab Take 3 tablets (1,500 mg) by mouth 2 times a day.      Alpha-Lipoic Acid 300 MG tablet Take 300 mg by mouth 2 times a day.      amLODIPine 5 MG tablet Take 1 tablet (5 mg) by mouth 2 times a day. 180 tablet 1    atorvastatin 10 MG tablet Take 1 tablet (10 mg) by mouth daily. 90 tablet 1    BENFOTIAMINE OR Take 250 mg by mouth every morning.      betamethasone dipropionate 0.05 % lotion APPLY TO SCALP AND EARS TOPICALLY 2 TIMES A DAY AS NEEDED FOR ITCHING. Notify MD of any signs of infection or skin thinning 60 mL 1    Cholecalciferol (VITAMIN D3) 2000 units Oral Cap Take 1 capsule (2,000 Units) by mouth daily. For low level 1 capsule 1    clindamycin 1 % gel Apply 1 Application  topically 2 times a day as needed. Apply to back.      cyanocobalamin (Vitamin B-12) 1000 MCG tablet Take 1 tablet (1,000 mcg) by mouth daily.      diclofenac 1 % gel Apply 4 g topically 4 times a day. Apply to 2 gram four times daily to neck, trapezius muscle, and low back. Use a max of  32 grams a day. 300 g 4    diclofenac sodium 75 MG EC tablet Take 1 tablet (75 mg) by mouth 2 times a day. Take  with food 60 tablet 11    DULoxetine 60 MG DR capsule Take 1 capsule (60 mg) by mouth 2 times a day. 180 capsule 1    EPINEPHrine 0.3 MG/0.3ML auto-injector Inject one pen (0.3mg /mL) into the muscle for severe allergic reaction. Call 911. Use second pen if symptoms continue 2 each 0    lidocaine 5 % patch Apply 1 patch onto the skin daily as needed. Apply to painful area for up to 12 hours in a 24 hour period. 30 patch 3    lisinopril 20 MG tablet TAKE 1 TABLET BY MOUTH TWICE DAILY 200 tablet 1    MAGNESIUM OR Take 2 tablets by mouth at bedtime.      mirabegron ER (Myrbetriq) 50 MG 24 hr tablet Take 1 tablet (50 mg) by mouth daily. 100 tablet 3    multivitamin with minerals tablet Take 1 tablet by mouth daily.      Omega-3 Fatty Acids (OMEGA 3 OR) Take 1 tablet by mouth every morning.      pregabalin 300 MG capsule Take 1 capsule (300 mg) by mouth 2 times a day. 180 capsule 0    primidone 50 MG tablet Take 1 tablet (50 mg) by mouth 2 times a day. 180 tablet 4    Thiamine HCl (VITAMIN B-1 OR) 100 mg.       Facility-Administered Medications Prior to Visit   Medication Dose Route Frequency Provider Last Rate Last Admin    iohexol (Omnipaque) 240 MG/ML contrast 10 mL  10 mL See Admin Instructions Once Lyda Perone, MD            PSH:   Past Surgical History:   Procedure Laterality Date    L meniciscus Left 07/2017    by Dr Marcelle Overlie , re did left menisicus     PR ANES; COLONOSCOPY  2002    repeat in 3 years    PR ANES; COLONOSCOPY & POLYPECTOMY  11/18/2007    repeat in 3 years    PR CORRECTION HAMMERTOE Right 05/13/2022    Dr. Hilarie Fredrickson, DPM    PR HALLUX RIGIDUS W/CHEILECTOMY 1ST MP JT W/O IMPLT Right 10/21/2017    Dr. Donn Pierini    PR UNLISTED PROCEDURE FEMUR/KNEE      PR UNLISTED PROCEDURE HANDS/FINGERS      PR UNLISTED PROCEDURE SPINE  2013    rfa to c3 to c 7     SURGICAL HX OTHER Right 12/03/2020    RIGHT  foot WOUND INCISION AND DRAINAGE    TOE SURGERY Right 02/09/2019    toe surgery  Right 01/12/2019    Dr Hilarie Fredrickson         Family History:  family history includes Colon Cancer in his father; Heart (other) in his mother; Other Family Hx in an other family member; Suicide Attempt in his brother. There is no history of Cancer.    Social History:  Social History     Socioeconomic History    Marital status: Married     Spouse name: [name]    Number of children: 2    Years of education: Not on file    Highest education level: Not on file   Occupational History    Not on file   Tobacco Use    Smoking status: Never    Smokeless tobacco: Never    Tobacco comments:     Sometimes chews, but is trying to quit. 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  Non-smoker, 14 or more drinks/week almost exclusively wine with dinner.      2024 still retired           Chief Executive Officer [employer] of Psychologist, prison and probation services Strain: Not on [employer] Insecurity: Not on file   Transportation Needs: Not on file   Physical Activity: Not on file   Stress: Not on file   Social Connections: Not on file   Intimate Partner Violence: Not on file   Housing Stability: Not on file        ROS is positive for musculoskeletal as mentioned in the HPI and as stated in the past medical history.  The patient's review of symptoms is otherwise unremarkable and documented in the electronic health record on 09/03/2023.    Physical Exam:   There were no vitals filed for this visit.     General: The patient is 73 year old year old  male White  with constitutional symptom: appears age stated.    Psychiatric/mood: normal mood and affect.    Neurological: alert and oriented x3.    Vascular:   RIGHT:  Dorsalis pedis pulse is palpable.  Posterior tibial pulse is palpable. Capillary filling time note to be immediate on the distal hallux.      LEFT:  Dorsalis pedis pulse is palpable.  Posterior tibial pulse is palpable. Capillary filling time note to be immediate on the distal hallux.      Neurologic:  RIGHT:  The sensation is intact on the foot and ankle.  Motor coordination is intact with normal muscle tone.  There is no manifestations of pathological reflexes noted.     LEFT:  The sensation is intact on the foot and ankle.  Motor coordination is intact with normal muscle tone.  There is no manifestations of pathological reflexes noted.      Dermatological:   RIGHT:  The incision scar(s) along the plantar arch is healed with minimal scar.       LEFT:   Upon inspection, the skin note to have normal texture/tone/turgor.  There is no ulceration/open wound or dermatosis present.  Integument coloration is within normal range and temperature is within normal range.  The hair growth is normal.      Musculoskeletal:   RIGHT:  There is moderate tenderness on the plantar fascia at plantar medial calcaneal tubercle and plantar fascia at plantar mid arch region.  The heel is not warmth to touch.  There is nodule along the plantar fascia at near insertion site.  The plantar fascia is normal with Hubscher maneuver.      LEFT:  There is no pain to palpation, the range of motion within normal limits and pain free, manual muscle testing 5/5 all extrinsic muscles to the foot.   Patient has rectus foot.       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 Dmario Russom is a 73 year old year old male where detailed education and discussion took place regards to plantar fibromatosis/fasciosis, right.   We discussed following treatment option i.e. continue current care, steroid injection therapy, Quickstride insert modification, and orthotics - functional and accommodative.  Answered all of patient's questions.  After the discussion, the patient elected steroid injection therapy and Quickstride insert modification.      OTC insert MODIFICATION:  Quickstride insert modification was perform by [name] which entails reinforce the arch on the bottom of the device with felt.       INJECTION:  Plantar fibroma intra lesional injection performed today - at midarch and near insertion site. Prepped arch with alcohol prep.  Sprayed area with Ethyl Chloride at initial injection.  A mixture of 2% Lidocaine plain with Kenalog(10mg ) was administered intralesionally to reduce symptoms and wasted 40 mg.  The patient was cautioned regarding hypopigmentation, fat atrophy and steroid flare.        Return appointment: follow up in 3-4 weeks if symptom worsen or fail to improve    [name] DPM, FACFAS  Reconstructive Ankle and Foot Surgeon  [facility]

## 2023-09-04 ENCOUNTER — Encounter (INDEPENDENT_AMBULATORY_CARE_PROVIDER_SITE_OTHER): Payer: Self-pay | Admitting: Family Medicine

## 2023-09-04 DIAGNOSIS — L309 Dermatitis, unspecified: Secondary | ICD-10-CM

## 2023-09-06 ENCOUNTER — Encounter (HOSPITAL_BASED_OUTPATIENT_CLINIC_OR_DEPARTMENT_OTHER): Payer: Self-pay | Admitting: Anesthesiology

## 2023-09-07 ENCOUNTER — Other Ambulatory Visit (INDEPENDENT_AMBULATORY_CARE_PROVIDER_SITE_OTHER): Payer: Self-pay | Admitting: Internal Medicine

## 2023-09-07 DIAGNOSIS — M792 Neuralgia and neuritis, unspecified: Secondary | ICD-10-CM

## 2023-09-07 MED ORDER — TRIAMCINOLONE ACETONIDE 40 MG/ML IJ SUSP
40.0000 mg | Freq: Once | INTRAMUSCULAR | Status: AC
Start: 2023-09-07 — End: 2023-09-08
  Administered 2023-09-08: 40 mg via INTRAMUSCULAR

## 2023-09-07 NOTE — Progress Notes (Unsigned)
PROCEDURE NOTE  Cervical Facet Medial Branch Nerve Radiofrequency Neurotomy  C5, C6 and C7 medial branches Radiofrequency ablation under fluoroscopy guidance; left.   (Aborted)    PRE-PROCEDURE DIAGNOSIS  (R60.454) Arthropathy of cervical facet joint  (primary encounter diagnosis)  (U98.119) Cervical facet joint syndrome  (M54.2) Cervicalgia.    POST-PROCEDURE DIAGNOSIS  (M47.812) Arthropathy of cervical facet joint  (primary encounter diagnosis)  (J47.829) Cervical facet joint syndrome  (M54.2) Cervicalgia.    INDICATION FOR PROCEDURE  Chronic axial neck pain,   Resistant to conservative treatment, PT, medication, and massage therapy.   Significant impairment of her daily functionality (driving, exercise), and quality of life.   Excellent and positive previous diagnostic nerve blocks.  After full disclosure of benefit/risks associated with procedure, patient agreed with procedure, and signed IC.     CONSENT:    Potential risks including pain, serious infection, paralysis, nerve injury, seizure, allergic reaction, and others were discussed with the patient. The patient was given and read a patient information sheet regarding injection procedures prior to today's injection. The patient understands the benefits, risks, and alternatives of the procedure and agrees to the procedure today.  Written informed consent was obtained.     ATTENDING PHYSICIAN  Wendi Maya, M.D.    FELLOW / RESIDENT  Roswell Nickel, MBBS    SEDATION:  YES, 4mg  midazolam, fentanyl    NPO STATUS:  YES    INTRAVENOUS LINE  YES    IMAGING TECHNIQUE: Fluoroscopic guidance was used for this procedure, appropriate images were viewed and saved.     TIME OUT/FINAL VERIFICATION: performed and documented.    POSITION:  Prone    PREP/DRAPE:  Chloroprep and sterile drapes.  Sterile technique used throughout.     MONITORING: Pulse oxymetry.    TIME-OUT: Performed.    DESCRIPTION OF THE PROCEDURE  Preparation  Position: Patient in prone with the head and  neck fully supported with pillow with his cervical spine straight and neutral.  Imaging technique: Multiplanar fluoroscopic guidance, appropriate images reviewed and saved.  Monitoring: Heart rate, pulse oximetry, blood pressure.  Time-out/final verification: Performed.  The injection area was prepped with Chloroprep and draped in a sterile fashion; sterile technique used for the entire procedure.     The C5 and C6 medial branches Radiofrequency ablation under fluoroscopy guidance; Left.     An true AP fluoroscopic image allowed visualization of target lateral mass (left).   An entry point to centroid of lateral mass at C5 and C6 was identified and and the skin was anesthetized with 1% lidocaine without added sodium bicarbonate. NimbusTrocar with stylet was first inserted intermittent to contact edge of lateral mass with coaxial technique under fluoroscopic guidance.  Intermittent lateral images were used to check for depth.     However, due to inadequate length of RFA needle ( 90mm in length), we had difficulty landing the RF cannula to the center of C5/C6 rhomboids. Additional pressure was applied to the end of hubs to add the needle depth. However, the electrodes were difficult inserting into RF probe. Upon inspection, the cannula were slightly bent along the hub restricting the entry of the RF electrodes.  We slightly withdrew the cannulae and gently straighten the cannulae and repositioned them to the C5 and C6 targets under multiplanar confirmatory images as shown below. To relieve the needle irritation, each level was anesthetized with 2 cc of 0.5% bupivacaine.   The RF electrode was then gently inserted into the C5 trocar and radiofrequency  lesioning was performed for 90 seconds at 80 degrees. Upon completion of lesioning, the RF electrode was removed and we injected 1 cc of 0.5% bupivacaine and 20mg  kenalog for procedural pain control. Cannula at C5 was removed without any  complications.        However, upon insertion of the RF electrode into C6 trocar, the following image was obtained, which shown that the electrode is inserted outside of cannula.  We decided not to carry on with the lesioning of C6 and attempted to remove both cannulae.        However, when we remove the cannulae at C6, we noticed that the black outer sheath of the cannulae was broken and retained in the patient.  Initially, we attempted multiple times to try removing the black outer sheath from the injection site with tissue forceps without success.  We decided to use a scalpel and made a small opening (5 mm in length) and used sterile hemostat to try to reach for the sheath to no avail.  We had to extend the incision to 1.5 cm so that Dr. Dorna Bloom could put his finger in and feel for the cannulae and managed to remove this sheath in whole with a hemostat.  After which, we took repeat fluoroscopy images which showed the following.  Reassuring that the radiopaque cannulae sheath was removed entirely.        The surgical incision was then cleaned with sterile saline, and we used a 3.0 Monocryl suture to close the surgical incision.  We put in 5 interrupted stitches, covered incision site with Dermabond, then Steri-Strips, gauze and then finally a Tegaderm.  Throughout the procedure, hemostasis was maintained with sterile gauze.    Estimated blood loss: 2ml    The patient was taken to the Post-block Recovery Area for further observation.     COMPLICATIONS  Successful lesion of only C5 medial branch, due to reasons mentioned above, we decided not to proceed with C6 and C7 radial frequency ablation.  Small incision made to remove indwelling trocar outer sheath  5 sutures were placed to close procedure incision  Indwelling trocar outer sheath was successfully removed, confirmed by repeated fluoroscopy images.    POST-PROCEDURE / INTERPRETATION  Mr. Eich was escorted to the recovery area in stable condition where him made an  uneventful recovery.    Mr. Troy was reassessed after the procedure.  The pain score was 5/10 before the procedure and 8/10 prior to discharge (0 = no pain, 10 = worst pain imaginable).  Mr. Obryant denies any neurological deficit and there is no particular concerns during immediate post-procedural recovery period.     PLAN  We apologize to patient and wife and they were debriefed in detail about what happened during the procedure.  We gave them postprocedural care instructions, to look out for signs of skin infection.  They will follow-up on 09/15/2023 with Dr. Dorna Bloom for possible suture removal and wound care  Right-sided C5, C6, C7 RFA will be delayed to next month to ensure that we have the 150 mm Nimbus trocar in stock  A patient safety report will be filed.  Pain diary in one week and follow-up with Dr. Dorna Bloom.    Roswell Nickel, MBBS  Pain Fellow  Department of Anesthesiology and Pain Medicine  [facility]    I was present for the entire procedure (Cervical Facet Medial Branch Nerve Radiofrequency Neurotomy: C5, C6 and C7 medial branches Radiofrequency ablation under fluoroscopy guidance; left. (Aborted))  and performed the most key elements of this visit.   I reviewed the documentation of the other providers and concur with Dr. Seward Carol findings. I edited the procedure note.  Imaging guidance was used for this procedure and representative images were saved.     This RFA part of this procedure won't be billed due to the procedural abortion.    I was present when Mrs. Zada Girt administered moderate sedation, verified adequate sedation, and continuously observed until face-to-face monitoring was no longer needed for a total of 82 minutes. This time excludes pre- and post-procedure activities which I have reviewed. (CPT:99152/99153)     Wendi Maya, MD    Interventional Pain Specialist  [facility]    Notation: This note has been generated with a voice recognition dictation software and  electronically signed.

## 2023-09-07 NOTE — Telephone Encounter (Signed)
 We are contacting you because the provider, Dr. Marisa Sprinkles, Willaim Sheng , is no longer at the clinic and patient has not established care with a new provider.  This falls outside of the Refill Authorization Center's protocols.  Please have another provider sign off on the medication(s) until the patient can be seen by their new provider.

## 2023-09-08 ENCOUNTER — Encounter (HOSPITAL_BASED_OUTPATIENT_CLINIC_OR_DEPARTMENT_OTHER): Payer: Self-pay | Admitting: Anesthesiology

## 2023-09-08 ENCOUNTER — Ambulatory Visit: Payer: Medicare PPO | Attending: Anesthesiology | Admitting: Anesthesiology

## 2023-09-08 ENCOUNTER — Other Ambulatory Visit (HOSPITAL_BASED_OUTPATIENT_CLINIC_OR_DEPARTMENT_OTHER): Payer: Self-pay | Admitting: Psychiatry

## 2023-09-08 VITALS — BP 136/85 | HR 80 | Temp 97.9°F | Resp 18 | Ht 76.0 in | Wt 225.0 lb

## 2023-09-08 DIAGNOSIS — M47812 Spondylosis without myelopathy or radiculopathy, cervical region: Secondary | ICD-10-CM | POA: Insufficient documentation

## 2023-09-08 DIAGNOSIS — F1020 Alcohol dependence, uncomplicated: Secondary | ICD-10-CM

## 2023-09-08 DIAGNOSIS — M542 Cervicalgia: Secondary | ICD-10-CM | POA: Insufficient documentation

## 2023-09-08 MED ORDER — FLUMAZENIL 0.5 MG/5ML IV SOLN
0.2000 mg | INTRAVENOUS | Status: DC | PRN
Start: 2023-09-08 — End: 2023-09-08

## 2023-09-08 MED ORDER — NALOXONE HCL 0.4 MG/ML IJ SOLN
0.0400 mg | INTRAMUSCULAR | Status: DC | PRN
Start: 2023-09-08 — End: 2023-09-08

## 2023-09-08 MED ORDER — FENTANYL CITRATE (PF) 250 MCG/5ML IJ SOLN
25.0000 ug | INTRAMUSCULAR | Status: DC | PRN
Start: 2023-09-08 — End: 2023-09-08
  Administered 2023-09-08: 50 ug via INTRAVENOUS

## 2023-09-08 MED ORDER — MIDAZOLAM HCL (PF) 2 MG/2ML IJ SOLN
INTRAMUSCULAR | Status: AC
Start: 2023-09-08 — End: 2023-09-08
  Filled 2023-09-08: qty 4

## 2023-09-08 MED ORDER — PREGABALIN 300 MG OR CAPS
300.0000 mg | ORAL_CAPSULE | Freq: Two times a day (BID) | ORAL | 0 refills | Status: DC
Start: 2023-09-08 — End: 2023-12-11

## 2023-09-08 MED ORDER — ONDANSETRON HCL 4 MG/2ML IJ SOLN
4.0000 mg | INTRAMUSCULAR | Status: DC | PRN
Start: 2023-09-08 — End: 2023-09-08

## 2023-09-08 MED ORDER — SODIUM CHLORIDE 0.9 % IV SOLN
50.0000 mL/h | INTRAVENOUS | Status: DC
Start: 2023-09-08 — End: 2024-05-18

## 2023-09-08 MED ORDER — TRIAMCINOLONE ACETONIDE 40 MG/ML IJ SUSP
INTRAMUSCULAR | Status: AC
Start: 2023-09-08 — End: 2023-09-08
  Filled 2023-09-08: qty 1

## 2023-09-08 MED ORDER — MIDAZOLAM HCL (PF) 2 MG/2ML IJ SOLN
0.5000 mg | INTRAMUSCULAR | Status: DC | PRN
Start: 2023-09-08 — End: 2023-09-08
  Administered 2023-09-08 (×4): 1 mg via INTRAVENOUS

## 2023-09-08 MED ORDER — FENTANYL CITRATE (PF) 100 MCG/2ML IJ SOLN
INTRAMUSCULAR | Status: AC
Start: 2023-09-08 — End: 2023-09-08
  Filled 2023-09-08: qty 4

## 2023-09-08 MED ORDER — BETAMETHASONE DIPROPIONATE 0.05 % EX LOTN
TOPICAL_LOTION | CUTANEOUS | 1 refills | Status: DC
Start: 2023-09-08 — End: 2023-11-05

## 2023-09-08 NOTE — Patient Instructions (Signed)
[facility]  Post-Procedure Discharge Instructions      After the procedure:  Immediate and complete pain relief is rare  Numbness and/or weakness in the area of your body supplied by the injected nerve; these symptoms should resolve but may last up to several hours  Some soreness and bruising at the injection site(s)    Activities:  If you have any weakness or numbness caused by the injection, DO NOT DRIVE or operate machinery and limit other activity until sensation returns to normal.  You may resume regular exercises/activities as tolerated.  If you received sedation, DO NOT DRIVE or operate machinery for 24 hours.    Medications:  If you stopped taking any blood thinning medications such as Coumadin or Plavix, you may resume these tomorrow unless specified differently by the prescribing physician.    Site care:  You may remove the band-aid after 6 hours.  You may shower today. No swimming, tub baths or hot tubs for 24 hours following your procedure.  For the first 48 hours, apply ice packs to the injection site for 15-20 minutes hourly as needed for comfort.  Wrap a light towel or cloth around ice packs and heating pad to protect the skin.  After 48 hours, use a warm heating pad to the injection site for 15-20 minutes hourly as needed for comfort.    If you received steroids today:  Steroid medications may cause facial flushing, occasional low-grade fevers, hiccups, insomnia, headaches, water retention, increased appetite, increased heart rate, and abdominal cramping or bloating. These side effects occur in only about 5 percent of patients and commonly disappear within one to three days after the injection.  If you are diabetic check your blood sugar more frequently than usual as you may develop an increase in blood sugar for the next 10-14 days. Contact your diabetes physician if this occurs.    Call us if you develop any of the following symptoms in the next 7 days:  Fever above 100 degrees  F     Any unusual increase in your level of pain  Swelling, bleeding, redness, or increased tenderness at the procedure or IV site  Headache not relieved by Tylenol (if you had an epidural steroid injection)  .    Contact us:  Anytime, 24 hours/day, 7 days/week at [phone], option 2, option 2.         [facility]  Patient Self-Administered Pain Diary  1 month        The procedure you just had was done in hopes of providing long term pain relief. Depending on the type of procedure you had, you will need to answer the questions below 1 month after your procedure.  Accurate completion and timely reporting of your pain diary(s) enables your Pain MD to review the results and make further treatment recommendations.    1 month after your procedure, write your answers to the 4 questions below and send your pain score diary to [place] either by:      1)  Sending a picture via MyChart   2)  Faxing to [phone]  3)  Calling the pain score voicemail line at [phone].  Leave the spelling of your first and last name, U#, date of birth and date of procedure and the answers to all of the 4 questions below.             Use this scale to rate your pain  0 1 2 3 4  5  6 7 8 9 10   No pain                      worst pain      Ronnie Mason   Procedure Date: 09/08/23  Procedure Name: Left Cooled C5-6 and C6-7 RFA   Procedure Attending: Dr. Dorna Bloom    1.  Pain score immediately before procedure: __________    2. 1 month after your procedure, do you feel better?  YES/NO    3.  If you answered YES to the above, by what percentage__________% (0-100%) has your pain been relieved.    4.  Pain Score now: _________ (at time of telephone call)        [facility]  Patient Self-Administered Pain Diary  3 months        The procedure you just had was done in hopes of providing long term pain relief. Depending on the type of procedure you had, you will need to answer the questions below 3 months after your procedure.  Accurate  completion and timely reporting of your pain diary(s) enables your Pain MD to review the results and make further treatment recommendations.    3 months after your procedure, write your answers to the 4 questions below and send your pain score diary to [place] either by:      1)  Sending a picture via MyChart   2)  Faxing to [phone]  3)  Calling the pain score voicemail line at [phone].  Leave the spelling of your first and last name, U#, date of birth and date of procedure and the answers to all of the 4 questions below.             Use this scale to rate your pain  0 1 2 3 4 5 6 7 8 9  10  No pain                      worst pain      Ronnie Mason   Procedure Date: 09/08/23  Procedure Name: Left Cooled C5-6 and C6-7 RFA   Procedure Attending: Dr. Dorna Bloom    1.  Pain score immediately before procedure: __________    2. 3 months after your procedure, do you feel better?  YES/NO    3.  If you answered YES to the above, by what percentage__________% (0-100%) has your pain been relieved.    4.  Pain Score now: _________ (at time of telephone call)

## 2023-09-08 NOTE — Telephone Encounter (Signed)
Pt requesting betamethasone dipropionate and pregabalin. Pregabalin RX is duplicate as there is a separate refill request from pharmacy.     Last refill of betamethasone was 06/16/2023 for 60 mL with 1 refill.   Last OV was 03/10/2023 with Dr. Marisa Sprinkles.     Routing to PCP, pharmacy pended.

## 2023-09-08 NOTE — Telephone Encounter (Signed)
 rx refilled. Routed to clinical staff pool.

## 2023-09-08 NOTE — Telephone Encounter (Signed)
Received refill request from pharmacy/patient for pregabalin    Last fill of pregabalin was on 03/10/2023 for #180 with 0 refills  Last OV was on 03/10/2023 with Dr. Marisa Sprinkles.    Pharmacy and medication loaded below. Ok to refill?

## 2023-09-08 NOTE — Progress Notes (Signed)
MODERATE SEDATION PRE-PROCEDURE ASSESSMENT/H&P    Vital signs:  Temp: 36.6 C  Pulse: 75  BP: (!) 148/91  Resp: 18  SpO2: 97 %                    Height: 6\' 4"  (193 cm)  Weight: (!) 102.1 kg (225 lb)  Body mass index is 27.39 kg/m.    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 (09/08/23 1232) Fall Interventions  Have you fallen in the past year?: No (09/08/23 1238)  Do you worry about falling?: No (09/08/23 1238)  Do you feel unsteady when standing or walking?: Yes (09/08/23 1238)  Assistive Device: Cane (09/08/23 1238)  Fall prevention intervention(s): Fall Risk bracelet placed, Call light within reach, Proper footwear discussed, Dangled at edge of bed and not dizzy (09/08/23 1238) STOP-Bang Assessment  Do you Snore Loudly (loud enough to be heard through closed doors or your bed-partner elbows you for snoring at night)?: No (09/08/23 1237)  Do you often feel Tired, Fatigued, or Sleepy during the daytime (such as falling asleep during driving or talking to someone)?: No (09/08/23 1237)  Has anyone Observed you Stop Breathing or Choking/Gasping during your sleep ?: No (09/08/23 1237)  Do you have or are being treated for High Blood Pressure ?: Yes (09/08/23 1237)  Recent BMI (Calculated): 27.5 (09/08/23 1237)  Is BMI greater than 35 kg/m2?: 0=No (09/08/23 1237)  Age older than 72 years old?: 1=Yes (09/08/23 1237)  Is your neck circumference greater than 17 inches (Male) or 16 inches (Male)?: Yes (09/08/23 1237)  Gender - Male: 1=Yes (09/08/23 1237)  STOP-Bang Total Score: 4 (09/08/23 1237)  Cardiovascular  Pulse: 80 (09/08/23 1525)  BP: 136/85 (09/08/23 1525)       Modified Aldrete  Activity: Able to move 4 extremities voluntarily or on command (09/08/23 1524)  Respiration: Able to breathe deeply and cough freely (09/08/23 1524)  Circulation: Blood  pressure within 20% of pre-anesthetic level (09/08/23 1524)  Consciousness: Fully awake (09/08/23 1524)  Oxygen Saturation: Able to maintain oxygen saturation greater than 92% on room air (09/08/23 1524)  Modified Aldrete Score: 10 (09/08/23 1524)Oxygenation Data  SpO2: 94 % (09/08/23 1525)  Oxygen Therapy: None (Room air) (09/08/23 1525)  O2 Delivery Method: Nasal cannula (09/08/23 1502)  O2 Flow Rate (L/min): 3 L/min (09/08/23 1502)Vitals  Temp: 36.6 C (09/08/23 1226)  Temp src: Temporal (09/08/23 1226)  Pulse: 80 (09/08/23 1525)  Heart Rate Source: Monitor (09/08/23 1525)  Resp: 18 (09/08/23 1525)  BP: 136/85 (09/08/23 1525)  MAP (mmHg): 92 (09/08/23 1525)  BP Site: Left Arm (09/08/23 1525)  BP Method: Automatic (09/08/23 1525)  Patient Position: Sitting (09/08/23 1510)  etCO2: 36 mmHg (09/08/23 1502)  Vital Sign Reason: Post-procedure (09/08/23 1525)     Pre-procedure sedation provider statement: I concur with the nurse's pre-procedure assessment above.    Procedure  Left C5-6 and C6-7 RFA     Physical Exam   Airway:  Intubation: Anticipate easy intubation  Mallampati: Class 3: Soft and hard palate and base of the uvula are visible    Mouth opening: Normal (greater or equal to 5 cm)  Prominent incisors: No   Neck mobility: Limited extension  Dentition: Normal    Cardiovascular: Rate: Tachycardia and Normal S1 and S2    Respiratory: Clear lung  fields without crackles, wheezing or expiratory prolongation    Abdominal: soft, protuberant    Neurological/Neuromuscular: Normal    Additional findings: None    Relevant Results  Lab Results   Component Value Date    WBC 4.66 02/01/2023    HEMOGLOBIN 14.8 02/01/2023    HEMATOCRIT 45 02/01/2023    PLATELET 201 02/01/2023    SODIUM 140 02/01/2023    POTASSIUM 4.2 02/01/2023    BUN 18 02/01/2023    CREATININE 0.94 02/01/2023    GLUCOSE 81 02/01/2023    INR 0.9 01/31/2023    PROTIME 12.8 01/31/2023    PTT 28 01/31/2023    AST 33 01/31/2023    ALT 28 01/31/2023    ALK 64  01/31/2023    ALBUMIN 3.8 01/31/2023    BILIRUBN 0.2 01/31/2023         Risk Factors  Status: Low:  ASA II.  Patient with mild systemic disease    Assessment: Okay for sedation    Plan/Recommendation  Plan for sedation: Moderate sedation    Attestation  I am an [employer] fellow or resident. I attest to having certification for moderate sedation (and having had direct supervision for the required number of procedures under moderate sedation). I'm now being indirectly supervised by Dr Jamas Lav of record] who has privileges for moderate sedation.    Assessment Update  Procedure more than 1 hour from above assessment: No changes

## 2023-09-08 NOTE — Progress Notes (Signed)
Ronnie Mason identified by name and DOB.   He confirmed he is having a Left Cooled C5-6 and C6-7 RFA   He ambulated to procedure area using a cane.    Is He receiving a steroid injection today?   If yes, has He received the COVID-19 vaccine (either brand) within the past 2 weeks? No  OR does He plan to receive the COVID-19 vaccine (either brand) within the next 2 weeks? No        Medications:  Antibiotics: NO  NSAIDS/ASA: YES held diclofenac 4 days   Anticoagulation:NO    NPO  Solids: 2100 yesterday  Liquids: 1110 this am    Post Procedure Discharge Escort  Spouse Ronnie Mason    Procedural Sedation  Start: 1344  Stop:  1506  Total time: 82  Total midazolam administered: 4  mg  Total fentanyl administered: 50 mcg    LAST DOSE: 1452    Pain score prior to procedure:  5/10  Pain score after procedure:  8/10    ------------------------------------------------------------------------------  Post-procedure Note    Patient was in normal sinus rhythm during the entire procedure.  Ronnie Mason met discharge criteria:  A/OX4/baseline, VSS/returned to baseline. Site clean dry and intact.  Written and verbal discharge instructions, including emergency contact phone numbers, reviewed with Ronnie Mason. Verbal understanding obtained from him.  Ronnie Mason dressed self without assistance. He was discharged greater than 30 minutes after last dose of sedation in stable condition, ambulatory with steady gait to home with all belongings and with ride/responsible person.     Discharge time: 1545

## 2023-09-09 ENCOUNTER — Telehealth (HOSPITAL_BASED_OUTPATIENT_CLINIC_OR_DEPARTMENT_OTHER): Payer: Self-pay

## 2023-09-09 ENCOUNTER — Ambulatory Visit (INDEPENDENT_AMBULATORY_CARE_PROVIDER_SITE_OTHER): Payer: Medicare PPO | Admitting: Acupuncturist

## 2023-09-09 ENCOUNTER — Encounter (HOSPITAL_BASED_OUTPATIENT_CLINIC_OR_DEPARTMENT_OTHER): Payer: Self-pay

## 2023-09-09 MED ORDER — NALTREXONE HCL 50 MG OR TABS
ORAL_TABLET | ORAL | 0 refills | Status: DC
Start: 2023-09-09 — End: 2024-01-24

## 2023-09-09 NOTE — Telephone Encounter (Signed)
Post procedure follow up call:    Procedure: C5-C7 RFA on 09/08/23 by Dr. Dorna Bloom    Reminded him of pain diary.   Follow up: Next appointment with Dr. Dorna Bloom on 09/15/23    [name]'s comments: Everything is going okay for now. I will remove the bandage in 2 days per Dr. Haskell Riling instruction.

## 2023-09-10 ENCOUNTER — Ambulatory Visit (INDEPENDENT_AMBULATORY_CARE_PROVIDER_SITE_OTHER): Payer: Medicare PPO | Admitting: Acupuncturist

## 2023-09-10 NOTE — Telephone Encounter (Signed)
Patient notified through MyChart. No further actions needed. Closing encounter.

## 2023-09-15 ENCOUNTER — Ambulatory Visit: Payer: Medicare PPO | Attending: Anesthesiology | Admitting: Anesthesiology

## 2023-09-15 ENCOUNTER — Ambulatory Visit: Payer: Medicare PPO | Admitting: Anesthesiology

## 2023-09-15 ENCOUNTER — Encounter (HOSPITAL_BASED_OUTPATIENT_CLINIC_OR_DEPARTMENT_OTHER): Payer: Self-pay | Admitting: Anesthesiology

## 2023-09-15 VITALS — BP 149/94 | HR 80 | Temp 97.2°F | Ht 76.0 in | Wt 225.0 lb

## 2023-09-15 DIAGNOSIS — L7682 Other postprocedural complications of skin and subcutaneous tissue: Secondary | ICD-10-CM | POA: Insufficient documentation

## 2023-09-15 DIAGNOSIS — M542 Cervicalgia: Secondary | ICD-10-CM | POA: Insufficient documentation

## 2023-09-15 DIAGNOSIS — M47812 Spondylosis without myelopathy or radiculopathy, cervical region: Secondary | ICD-10-CM | POA: Insufficient documentation

## 2023-09-15 NOTE — Progress Notes (Signed)
Review of Systems   Constitutional: Negative.    HENT: Negative.    Eyes: Negative.    Respiratory: Negative.    Cardiovascular: Negative.    Gastrointestinal: Negative.    Endocrine: Negative.    Genitourinary: Negative.    Musculoskeletal: Positive for arthralgias, back pain, gait problem and neck pain.   Skin: Negative.    Allergic/Immunologic: Negative.    Hematological: Negative.    Psychiatric/Behavioral: Negative.

## 2023-09-15 NOTE — Progress Notes (Deleted)
HPI:     Patient was last seen by Dr. Dorna Bloom on 09/08/2023 for C5, C6 and C7 medial branches RFA under fluoroscopy guidance and IV sedation; left arm. However, procedure was aborted due to inadequate length of RFA needle.     Since last visit, patient reports no significant events. Currently, they do not have any pain at the ablation site or the cervical region. Patient reported fatigue for two days after the procedure, but returned to baseline. The patient's wife is providing wound care, and manages the area with aquafor. Patient denies any bleeding or draining at the surgical site and denies any fevers, chills.     Patient reports no cervical pain at rest and improvement in neck ROM since the procedure. Patient is working with a physical therapy for ROM.      Murphy's main concern today planning for the second cervical RFA and removal of the stiches from the surgical site.       Physical Exam    Skin: Non-erythematous 3 cm linear incision with 3 dissolvable stiches present, lateral of C5-C6 on the left-side. No drainage, blood, or pus at incision site.

## 2023-09-15 NOTE — Patient Instructions (Signed)
1.            The wound check-up is completed and looks appropriate to the normal healing process, no concerns of local infection.     2.            The full disclosure was offered:  2.1 The reason of technical failure and aborted procedure.   2.2 The difficult nature of cervical procedure.  2.3  the update to the referring physician (Dr. Gust Brooms)  2.4 The "No billing for procedural part" given aborted procedure.  2.5 the option for different proceduralist was offered if there is any concern with the received care.  2.6 The future plan to continue RFA with Dr. Dorna Bloom, if there is further concern.  Both he and his wife wants to proceed with the RFA with Dr. Dorna Bloom and expressed no concerns at this time.      3.            Continue would care- keep local clean and dry   Using alcohol pads to daily clean the wound until the dissolvable suture is falling off)     4.            Follow-up: with me on the day of procedure.

## 2023-09-15 NOTE — Progress Notes (Signed)
09/15/2023   Ronnie Mason;    MRN: [id number];   DOB: 10/05/1950     Chief Complaint   Patient presents with    Neck Pain     History of Present Illness:  Ronnie Mason was referred by Dr. Gust Brooms for the cervical RFA to manage his bilateral axial neck pain.  Last seen by [name], MD on 04/01/23: From that visit:    Diagnostic evaluation:   Patient with minimal improvement following caudal ESI, not candidate for repeat caudal ESI. Patient interested in pursuing any intervention that will delay lumbar spine surgery. Upon review of MRI L Spine (09/02/22), suboptimal anatomy for both transforaminal ESI and L4-5 ESI. Patient desires to pursue a LESI at a more cephalad level with attempt to place catheter for caudal injection.      Further options of lumbar spine surgery and lumbar SCS were discussed with patient. He was relatively uninterested at this time but willing to reestablish with Dr. Welton Flakes (neurosurgery) for discussion of surgical options for lumbar spine.      Rehabilitation:   Continue acupuncture     Medication suggestions:   Continue pregabalin 300mg  BID  Continue duloxetine 60mg  BID  Continue lidocaine patch     Procedure suggestions:   Plan for diagnostic MBB at bilateral C5-6, C6-7 levels    Current medications related to pain and related conditions include:  Pregabalin 300mg  bid  Duloxetine 60mg  bid  Lidocaine 5% patch    Past pain treatments/diagnostic testing include (copied forward and updated from the [facility] note dated 03/08/2023):   Dr. [name]  03/23/2018 - LEFT L5 TFESI with 80-85% relief for about 3-4 weeks of just axial back pain.   09/15/2017 - Bilateral L5 TFESI with 50-75% relief of mixture of low back and leg pain.   05/19/2017 - RIGHT L5 TFESI with 100% relief of Radicular symptoms for about 3 months  Dr. Amada Jupiter  Injections in our clinic have included (exluding MBB and trigger point injections):  08/12/2016 - L5-S1 ILESI  07/01/2016 - Ultrasound guided right trochanteric bursa  injection   05/20/2016 - Bilateral shoulder injections  05/13/2016 - Right L3 L4 L5/S1 RFA  02/19/2016 -  Left L3, L4, L5/S1 RFA  12/27/2012 - L5-S1 IL ESI  08/31/2012 - Left L5 TFESI  03/30/2012 - Left L4/5, L5/S1 facet inj  02/10/2012 - Left C6 C7 MB RFA  07/03/2011 - Left L3, L4, L5/S1 RFA  05/12/2011 - Left C4, C5, C6 RFA     Interval medical history:  Mr. [name] was  last seen by Dr. Dorna Bloom on 09/08/2023 for C5, C6 and C7 medial branches Radiofrequency ablation under fluoroscopy guidance and IV sedation; left. However, it was aborted due to inadequate length of RFA needle (see the detail of procedural note).    In brief, during the procedure on 09/08/2023:  Successful lesion of only C5 medial branch, due to reasons mentioned above, we decided not to proceed with C6 and C7 radial frequency ablation.  Small incision made to remove indwelling trocar outer sheath  5 sutures were placed to close procedure incision  Indwelling trocar outer sheath was successfully removed, confirmed by repeated fluoroscopy images.    In the previous procedural day on 05/12/2023:  The first diagnostic cervical MBBs were performed on 04/07/2023. The 2nd diagnostic cervical MBBs was NOT performed due to nasal bleeding in the context of hypertension and high anxiety.     He is here today for a follow up visit with  his wife.    Since last visit, patient reports no significant events. Currently, they do not have any pain at the ablation site or the cervical region. Patient reported fatigue for two days after the procedure, but returned to baseline. The patient's wife is providing wound care, and manages the area with aquafor. Patient denies any bleeding or draining at the surgical site and denies any fevers, chills.     Patient reports no cervical pain at rest and improvement in neck ROM since the procedure. Patient is working with a physical therapy for ROM.      [name]'s main concern today planning for the second cervical RFA and removal of the stiches  from the surgical site.     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, N13.8] 07/17/2022    Urge incontinence [N39.41] 07/17/2022    Mallet toe, right [M20.5X1] 04/23/2022     Added automatically from request for surgery [id number]      Chronic bilateral low back pain without sciatica [M54.50, G89.29] 04/29/2021    Psychological factors affecting medical condition [F54] 04/24/2021    Macrocytosis without anaemia [D75.89] 02/12/2020    Myofascial pain [M79.18] 10/20/2019    MRSA (methicillin resistant staph aureus) culture positive [Z22.322] 07/13/2019    Closed fracture of one rib of right side [S22.31XA] 01/26/2019     See x ray 01/26/2019   Right anterior lat sub acute chronic       Enuresis [R32] 09/06/2018    Chronic left-sided low back pain without sciatica [M54.50, G89.29] 06/21/2018    Acute left-sided low back pain without sciatica [M54.50] 03/14/2018    Greater trochanteric pain syndrome of right lower extremity [M25.551] 11/25/2017    Complex tear of medial meniscus of left knee as current injury [S83.232A] 09/08/2017    Primary osteoarthritis of left knee [M17.12] 09/08/2017    Right lumbar radiculitis [M54.16] 05/05/2017    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, 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] 04/13/2017    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] 01/11/2017     Osteoarthritis of right hip x ray report - moderate to advanced radiology read 01/11/2017      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] 08/06/2016    Impaired mobility and ADLs [Z74.09, Z78.9]  04/08/2016    Difficulty in walking, not elsewhere classified [R26.2] 03/12/2016    Vitamin D deficiency [E55.9] 02/13/2016    Balance problem [R26.89] 01/23/2016    Chronic pain of both shoulders [M25.511, G89.29, M25.512] 01/23/2016     X ray arthritis  02/19/2016        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, 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, 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] 01/06/2016    Essential hypertension [I10] 12/16/2015    Essential tremor [G25.0] 12/16/2015    Hx of spontan intraparenchymal intracran bleed assoc with hypertension [Z86.79] 12/16/2015     [place] 4/29- 12/10/2015  Non-traumatic intraparenchymal hemorrhage, hypertensive of the left thalamus   Traumatic hemorrhage   -- falcine subdural hemorrhage, small cortical subarachnoid  Alcohol abuse [F10.10] 12/16/2015    Cerebral ventriculomegaly [G93.89] 10/09/2015    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    History of alcohol dependence (HCC) [F10.21] 06/26/2015    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) [G37.9] 05/07/2015    Idiopathic peripheral neuropathy [G60.9] 03/15/2015    Cervical facet joint syndrome [M47.812] 05/28/2014    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] 01/18/2014    Tremor [R25.1] 04/20/2013    Fall [W19.XXXA] 02/23/2013     BP < 100  Occasioanal tri[p and leg weakness      Back pain, lumbosacral [M54.50] 02/15/2013    Lumbar radiculopathy, chronic [M54.16] 02/15/2013    Neck pain [M54.2] 02/15/2013    Chronic pain [G89.29] 12/21/2012     Now at [facility], DO, Santiago Glad    Neck pain -burn c3-7 on left side per patient  Some trigger injection    Lumbar pain-since 80's better with exercise      Bee sting allergy [Z91.030] 12/21/2012     hives      Numbness and tingling of leg [R20.0, R20.2] 12/21/2012     Left calf -left foot long term suspect from back-suspect L 45      Chronic back pain [M54.9, G89.29] 12/21/2012     Mri in mid scape 1/214  Multilevel degenerative disc disease of the lumbar spine. Circumferential disc  bulge at all levels below and including L1-2. No significant central spinal canal stenosis or neuroforaminal narrowing.  2. Mild retrolisthesis of L5 on S1.            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    Idiopathic peripheral neuropathy 03/15/2015    Spondylosis of cervical region without myelopathy or radiculopathy 05/28/2014    Back pain, lumbosacral 02/15/2013    B12 deficiency 12/21/2012    Borderline low 5 /21/2014    Chronic renal insufficiency, stage III (moderate) (HCC) 12/21/2012    5/ 2013    Hyperlipidemia 09/26/2012    Cervicalgia 03/11/2010    Radiofrequency ablation of c3 to c 7    Allergic rhinitis due to allergen     Carotid Sinus Hypersensitivity     Chronic pain     chronic LUE pain had injection.    Disc disorder of lumbosacral region     Hip injury     HYPERTENSION      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[facility])      Past Surgical History:   Procedure Laterality Date    L meniciscus Left 07/2017    by Dr Marcelle Overlie , re did left menisicus     PR ANES; COLONOSCOPY  2002    repeat in 3 years    PR ANES; COLONOSCOPY & POLYPECTOMY  11/18/2007    repeat in 3 years    PR CORRECTION HAMMERTOE Right 05/13/2022    Dr. Hilarie Fredrickson, DPM    PR HALLUX RIGIDUS W/CHEILECTOMY 1ST MP JT W/O IMPLT Right 10/21/2017    Dr. Donn Pierini    PR  UNLISTED PROCEDURE FEMUR/KNEE      PR UNLISTED PROCEDURE HANDS/FINGERS      PR UNLISTED PROCEDURE SPINE  2013    rfa to c3 to c 7     SURGICAL HX OTHER Right 12/03/2020    RIGHT foot WOUND INCISION AND DRAINAGE    TOE SURGERY Right 02/09/2019    toe surgery  Right 01/12/2019    Dr Hilarie Fredrickson      Family History       Problem (# of Occurrences) Relation (Name,Age of Onset)    Heart (other) (1) Mother: MI    Other Family Hx (1) Other: myelodysplasia    Colon Cancer (1) Father    Suicide Attempt (1) Brother: died of suicide           Negative family history of: Cancer          Social History     Tobacco Use    Smoking status: Never    Smokeless tobacco: Never    Tobacco comments:     Sometimes chews, but is trying to quit. 11/28/21   Substance Use Topics    Alcohol use: Yes     Alcohol/week: 3.0 standard drinks of alcohol     Types: 3 Glasses of wine per week     Comment: goal to get down to 2 per day     Past medical, surgical, family, and social history was  reviewed and updated personally with Mr. Belkin.  He reports no other change in past medical history, past surgical history, family history, or social history since his  last visit except as noted above.  Medications and allergies were also reviewed and confirmed personally.  Current Outpatient Medications   Medication Sig Dispense Refill    Acetaminophen 500 MG Oral Tab Take 3 tablets (1,500 mg) by mouth 2 times a day.      Alpha-Lipoic Acid 300 MG tablet Take 300 mg by mouth 2 times a day.      amLODIPine 5 MG tablet Take 1 tablet (5 mg) by mouth 2 times a day. 180 tablet 1    atorvastatin 10 MG tablet Take 1 tablet (10 mg) by mouth daily. 90 tablet 1    BENFOTIAMINE OR Take 250 mg by mouth every morning.      betamethasone dipropionate 0.05 % lotion APPLY TO SCALP AND EARS TOPICALLY 2 TIMES A DAY AS NEEDED FOR ITCHING. Notify MD of any signs of infection or skin thinning 60 mL 1    Cholecalciferol (VITAMIN D3) 2000 units Oral Cap Take 1 capsule (2,000 Units) by  mouth daily. For low level 1 capsule 1    clindamycin 1 % gel Apply 1 Application  topically 2 times a day as needed. Apply to back.      cyanocobalamin (Vitamin B-12) 1000 MCG tablet Take 1 tablet (1,000 mcg) by mouth daily.      diclofenac 1 % gel Apply 4 g topically 4 times a day. Apply to 2 gram four times daily to neck, trapezius muscle, and low back. Use a max of 32 grams a day. 300 g 4    diclofenac sodium 75 MG EC tablet Take 1 tablet (75 mg) by mouth 2 times  a day. Take with food 60 tablet 11    DULoxetine 60 MG DR capsule Take 1 capsule (60 mg) by mouth 2 times a day. 180 capsule 1    EPINEPHrine 0.3 MG/0.3ML auto-injector Inject one pen (0.3mg /mL) into the muscle for severe allergic reaction. Call 911. Use second pen if symptoms continue 2 each 0    lidocaine 5 % patch Apply 1 patch onto the skin daily as needed. Apply to painful area for up to 12 hours in a 24 hour period. 30 patch 3    lisinopril 20 MG tablet TAKE 1 TABLET BY MOUTH TWICE DAILY 200 tablet 1    MAGNESIUM OR Take 2 tablets by mouth at bedtime.      mirabegron ER (Myrbetriq) 50 MG 24 hr tablet Take 1 tablet (50 mg) by mouth daily. 100 tablet 3    multivitamin with minerals tablet Take 1 tablet by mouth daily.      naltrexone 50 MG tablet TAKE 1 TABLET BY MOUTH DAILY WITH FOOD 30 tablet 0    Omega-3 Fatty Acids (OMEGA 3 OR) Take 1 tablet by mouth every morning.      pregabalin 300 MG capsule Take 1 capsule (300 mg) by mouth 2 times a day. 180 capsule 0    primidone 50 MG tablet Take 1 tablet (50 mg) by mouth 2 times a day. 180 tablet 4    Thiamine HCl (VITAMIN B-1 OR) 100 mg.       Current Facility-Administered Medications   Medication Dose Route Frequency Provider Last Rate Last Admin    sodium chloride 0.9 % infusion  50 mL/hr Intravenous Continuous [place] Nickel, MBBS         Review of patient's allergies indicates:  Allergies   Allergen Reactions    Adhesives Skin: Rash     Glue on lido patches    Bee Venom Skin: Hives, Skin: Itching  and Swelling    Gabapentin Other     Flu like symptoms, diarrhea, body ache upset stomach    Methocarbamol Other     Patient's wife reports cognitive difficulties ("goofy")     Review of Systems:  The review of systems documented by our clinic staff was provided by the patient. I did review the ROS and have no further detail to add..    Physical Examination: BP (!) 149/94   Pulse 80   Temp 36.2 C (Temporal)   Ht 6\' 4"  (1.93 m)   Wt (!) 102.1 kg (225 lb) Comment: per patient  SpO2 97%   BMI 27.39 kg/m    Skin: Non-erythematous 3 cm linear incision with 3 dissolvable stiches present, lateral of C5-C6 on the left-side. No drainage, blood, or pus at incision site.          Assessment/Discussion  (L76.82) Pain at surgical incision  (primary encounter diagnosis)  (Z61.096) Arthropathy of cervical facet joint  (E45.409) Cervical facet joint syndrome  (M54.2) Cervicalgia     Ronnie Mason is a 73 year old male with a past medical history of Progressive left neck pain without radiation to left lateral upper armconsistent with cervical facet joint syndrome, particularly at the bilateral C5-6, C6-7 levels. Evaluated by Dr. Welton Flakes on 09/01/22 and determined to be likely candidate for C4-6 ACDF. Responded well to CESI on 12/04/2022 but due to lack of radiculitis, not indicated at this time. Now s/p the diagnostic cervical MBBs, but s/p aborted cervical RFA with Dr. Dorna Bloom due to technical complications as the result of inadequate  length of RFA needles.     His other medical co morbidities:  Low back pain 2/2 facet arthropathy with improvement after L4-5, L5-S1 RFA and lumbosacral radiculopathy with improvement after recent caudal ESI. Additional components of myofascial pain syndrome, central sensitization.  Right hip pain 2/2 severe OA.  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  Patient was encouraged to discuss my recommendations with primary care provider. Mr. Karaffa had no further questions.      Recommendations:  Our approach to every chronic pain patient is multimodal and multidisciplinary.    1. The wound check-up is completed and looks appropriate to the normal healing process, no concerns of local infection.    2. The full disclosure was offered:  2.1 The reason of technical failure and aborted procedure.   2.2 The difficult nature of cervical procedure.  2.3  the update to the referring physician (Dr. Gust Brooms)  2.4 The "No billing for procedural part" given aborted procedure.  2.5 the option for different proceduralist was offered if there is any concern with the received care.  2.6 The future plan to continue RFA with Dr. Dorna Bloom, if there is further concern.  Both he and his wife wants to proceed with the RFA with Dr. Dorna Bloom and expressed no concerns at this time.     3. Continue would care- keep local clean and dry   Using alcohol pads to daily clean the wound until the dissolvable suture is falling off)    4.  Follow-up: with me on the day of procedure.    Coordination of care:    We suggested that Mr. Maisie Fus discuss our consultation findings with his  PCP, [name], MD (General) and other involved health care providers.     By clinic policy we do not prescribe and are therefore grateful  if further medication management is done with the patient's PCP.    Sabino Dick - MS    The Teaching Physician was present while Sabino Dick - the medical student performs the history of present illness, the physical exam and medical decision making.  The Teaching Physician re-performed the exam and medical decision making and verify the history of present illness.  The teaching physician verified the medical student's documentation is true and accurate with editing as needed.    Wendi Maya, MD    Interventional Pain Specialist  [facility]    Notation: This note has been generated with a voice recognition dictation software and electronically signed.   2:08 PM  09/15/2023

## 2023-09-17 ENCOUNTER — Ambulatory Visit (INDEPENDENT_AMBULATORY_CARE_PROVIDER_SITE_OTHER): Payer: Medicare PPO | Admitting: Acupuncturist

## 2023-09-24 ENCOUNTER — Ambulatory Visit (INDEPENDENT_AMBULATORY_CARE_PROVIDER_SITE_OTHER): Payer: Medicare PPO | Admitting: Acupuncturist

## 2023-10-01 ENCOUNTER — Ambulatory Visit (INDEPENDENT_AMBULATORY_CARE_PROVIDER_SITE_OTHER): Payer: Medicare PPO | Admitting: Acupuncturist

## 2023-10-01 ENCOUNTER — Other Ambulatory Visit (HOSPITAL_BASED_OUTPATIENT_CLINIC_OR_DEPARTMENT_OTHER): Payer: Self-pay

## 2023-10-01 DIAGNOSIS — L7682 Other postprocedural complications of skin and subcutaneous tissue: Secondary | ICD-10-CM

## 2023-10-01 DIAGNOSIS — T148XXA Other injury of unspecified body region, initial encounter: Secondary | ICD-10-CM

## 2023-10-01 MED ORDER — AMOXICILLIN-POT CLAVULANATE 875-125 MG OR TABS
875.0000 mg | ORAL_TABLET | Freq: Two times a day (BID) | ORAL | 0 refills | Status: AC
Start: 2023-10-01 — End: 2023-10-06

## 2023-10-01 NOTE — Progress Notes (Unsigned)
 Brief Note    S: Ronnie Mason is 73 year old male s/p C%-C7 RFA last seen by Dr Dorna Bloom on 09/15/2023 for a wound check after RFA cannula broke. His prior wound check had no concerns. Dj presents with increased drainage, redness and pain around the sutures.     O: Partially healed wound with partially dissolvable sutures present on upper right back. Evidence of drainage, swelling and erythema and warmth. TTP        A/P Wound had drainage, swelling, erythema, and warmth. Tender to palpation. Patient had picture from day prior showing slightly larger area than today. Denied any systemic fever, chills or new symptom besides pain and swelling in the area. Site appears infected as local cellulitis no sign of abscess. Discussed case telephonically with Dr Dorna Bloom   Cleansed wound, Removed sutures, cleaned wound with Betadine, Covered with bandages.    Will rx augmentin 875 bid for 5 days. Patient will follow up with Dr Dorna Bloom on 10/06/23

## 2023-10-04 ENCOUNTER — Encounter (INDEPENDENT_AMBULATORY_CARE_PROVIDER_SITE_OTHER): Payer: Self-pay | Admitting: Family Medicine

## 2023-10-04 ENCOUNTER — Ambulatory Visit (INDEPENDENT_AMBULATORY_CARE_PROVIDER_SITE_OTHER): Payer: Medicare PPO | Admitting: Family Medicine

## 2023-10-04 VITALS — BP 135/86 | HR 92 | Temp 97.1°F | Ht 75.98 in | Wt 231.2 lb

## 2023-10-04 DIAGNOSIS — M542 Cervicalgia: Secondary | ICD-10-CM

## 2023-10-04 DIAGNOSIS — I10 Essential (primary) hypertension: Secondary | ICD-10-CM

## 2023-10-04 DIAGNOSIS — Z23 Encounter for immunization: Secondary | ICD-10-CM

## 2023-10-04 DIAGNOSIS — F1021 Alcohol dependence, in remission: Secondary | ICD-10-CM

## 2023-10-04 DIAGNOSIS — E785 Hyperlipidemia, unspecified: Secondary | ICD-10-CM

## 2023-10-04 DIAGNOSIS — G250 Essential tremor: Secondary | ICD-10-CM

## 2023-10-04 DIAGNOSIS — Q043 Other reduction deformities of brain: Secondary | ICD-10-CM

## 2023-10-04 DIAGNOSIS — N1831 Chronic kidney disease, stage 3a: Secondary | ICD-10-CM

## 2023-10-04 DIAGNOSIS — L97501 Non-pressure chronic ulcer of other part of unspecified foot limited to breakdown of skin: Secondary | ICD-10-CM

## 2023-10-04 DIAGNOSIS — G379 Demyelinating disease of central nervous system, unspecified: Secondary | ICD-10-CM

## 2023-10-04 DIAGNOSIS — M5417 Radiculopathy, lumbosacral region: Secondary | ICD-10-CM

## 2023-10-04 DIAGNOSIS — G910 Communicating hydrocephalus: Secondary | ICD-10-CM

## 2023-10-04 DIAGNOSIS — Z0000 Encounter for general adult medical examination without abnormal findings: Secondary | ICD-10-CM

## 2023-10-04 DIAGNOSIS — R790 Abnormal level of blood mineral: Secondary | ICD-10-CM

## 2023-10-04 DIAGNOSIS — R0609 Other forms of dyspnea: Secondary | ICD-10-CM

## 2023-10-04 LAB — CBC, DIFF
% Basophils: 0 %
% Eosinophils: 4 %
% Immature Granulocytes: 0 %
% Lymphocytes: 15 %
% Monocytes: 10 %
% Neutrophils: 71 %
% Nucleated RBC: 0 %
Absolute Eosinophil Count: 0.26 10*3/uL (ref 0.00–0.50)
Absolute Lymphocyte Count: 1.06 10*3/uL (ref 1.00–4.80)
Basophils: 0.03 10*3/uL (ref 0.00–0.20)
Hematocrit: 47 % (ref 38.0–50.0)
Hemoglobin: 15.9 g/dL (ref 13.0–18.0)
Immature Granulocytes: 0.03 10*3/uL (ref 0.00–0.05)
MCH: 34.7 pg — ABNORMAL HIGH (ref 27.3–33.6)
MCHC: 34.1 g/dL (ref 32.2–36.5)
MCV: 102 fL — ABNORMAL HIGH (ref 81–98)
Monocytes: 0.72 10*3/uL (ref 0.00–0.80)
Neutrophils: 5.15 10*3/uL (ref 1.80–7.00)
Nucleated RBC: 0 10*3/uL
Platelet Count: 191 10*3/uL (ref 150–400)
RBC: 4.58 10*6/uL (ref 4.40–5.60)
RDW-CV: 13.8 % (ref 11.0–14.5)
WBC: 7.25 10*3/uL (ref 4.3–10.0)

## 2023-10-04 LAB — LIPID PANEL
Cholesterol/HDL Ratio: 1.5
HDL Cholesterol: 155 mg/dL (ref >39)
LDL Cholesterol, NIH Equation: 66 mg/dL (ref <130)
Non-HDL Cholesterol: 85 mg/dL (ref 0–159)
Total Cholesterol: 240 mg/dL — ABNORMAL HIGH (ref <200)
Triglyceride: 123 mg/dL (ref <150)

## 2023-10-04 LAB — COMPREHENSIVE METABOLIC PANEL
ALT (GPT): 34 U/L (ref 10–48)
AST (GOT): 28 U/L (ref 9–38)
Albumin: 4.4 g/dL (ref 3.5–5.2)
Alkaline Phosphatase (Total): 83 U/L (ref 36–161)
Anion Gap: 13 — ABNORMAL HIGH (ref 4–12)
Bilirubin (Total): 0.5 mg/dL (ref 0.2–1.3)
Calcium: 9.4 mg/dL (ref 8.9–10.2)
Carbon Dioxide, Total: 26 meq/L (ref 22–32)
Chloride: 102 meq/L (ref 98–108)
Creatinine: 0.84 mg/dL (ref 0.51–1.18)
Glucose: 93 mg/dL (ref 62–125)
Potassium: 4.8 meq/L (ref 3.6–5.2)
Protein (Total): 6.8 g/dL (ref 6.0–8.2)
Sodium: 141 meq/L (ref 135–145)
Urea Nitrogen: 23 mg/dL — ABNORMAL HIGH (ref 8–21)
eGFR by CKD-EPI 2021: >60 mL/min/{1.73_m2} (ref >59)

## 2023-10-04 LAB — MAGNESIUM: Magnesium: 1.5 mg/dL — ABNORMAL LOW (ref 1.8–2.4)

## 2023-10-04 NOTE — Patient Instructions (Signed)
 Leading a Healthy Life  Six tips to help improve your health and wellness     This explains how these 6 basic guidelines may improve your health and wellness:   Eat well to give your body the energy it needs.   Stay or get active.   A healthy mind is part of a healthy body.   Practice safe living habits.   Keep your mind and body free of harmful drugs and alcohol.   Get regular health care.     Tip #1: Eat well to give your body the energy it needs.   Your body needs nutritious foods to stay strong and healthy.   Here are some general eating guidelines:   Have 2 servings of fish or other seafood 2 times a week (1 serving = 4 ounces).   If you eat dairy products, choose low-fat (1%) or nonfat ones.   If you eat meat, cut down on the amount. Replace it with plant-based foods such as beans, whole grains, fruits and vegetables, and nuts and seeds.   Have less than 1,500 mg of sodium (salt) a day.   Cut down on "junk food" like alcohol, fatty foods, chips, candy, and other sweets.     Tip #2: Stay or get active.   Exercise for at least 30 minutes at a time, 3 times a week. Regular physical activity can help you:   Live longer and feel better   Be stronger and more flexible   Build strong bones   Prevent depression   Strengthen your immune system   Maintain a healthy body weight     Tip #3: Remember: A healthy mind is part of a healthy body.   A good state of mind can help you make healthy choices. Here are a few tips for keeping your mind healthy:   Reduce stress in your life.   Make some time every day for things that are fun.   Get enough sleep. Lack of sleep reduces how well you can concentrate, increases mood swings, and raises your risk of having a car accident.   Ask your health care provider for help if you feel depressed or anxious for more than several days in a row.     Tip #4: Practice safe living habits.   Accidents and Injuries     Accidents and injuries are the 5th leading cause of death in the [place]    Accidents in the home cause thousands of permanent injuries every year.     The most common accidents are fires, falls, and drowning. To help yourself and your family stay safe:   Install smoke detectors on each floor of your home.   Make sure everyone in your family knows how to swim  Stay safe on the road:  Wear a seatbelt.   Do not ride with someone who has been drinking or taking drugs.   Do not speak on a cell phone or send, read, or write text messages while you are driving.   Wear a helmet when you ride a bicycle or motorcycle.   Get enough sleep at night, and do not drive when you are tired.     Hand Hygiene   Protect yourself from germs by washing your hands often. Always wash your hands:   After you change a diaper or use the toilet   Before you start and after you finish preparing food     Tip #5: Keep your mind and body free of  harmful drugs and alcohol.   Tobacco causes more health problems than any other substance. These problems include lung disease, heart disease, and many types of cancer. The nicotine in tobacco is the most addictive and widely used drug.   Too much alcohol can cause damage to your liver, heart, brain, bones, and other body tissues. Being under the influence of alcohol also increases your chance of being injured in an accident.    Alcohol can cause fetal alcohol syndrome in your children if you drink regularly when you are pregnant.   Street drugs, like marijuana, cocaine, methamphetamine, heroin, or pain pills not prescribed by your doctor can harm your health. They may be mixed with harmful substances, and using them can cause people to put themselves in dangerous situations.     Tip #6: Get regular health care.   Many people think they need to see the doctor only when they are sick. But, health care providers can also help you stay healthy.   Find a health care provider who works with you to manage your health.   Ask your health care provider what diseases you are at risk for.  Learn what you can do to prevent or control them.   Get yourself and your family immunized against life-threatening diseases.

## 2023-10-04 NOTE — Progress Notes (Signed)
 10/04/23 12:18   Please list care providers that are OUTSIDE of [facility].  [facility]   Over the past 4 weeks, how do you rate your overall health? Excellent   Feeling stress over health, finances, relationships or work? Not at all   Body pain? More than half the days   Feeling anger? Not at all   Feeling lonely or socially isolated? Not at all   During the last 4 weeks has your health affected your social activities? Not at all   In the past week, how many days did you exercise?  4   How intense was your typical exercise? Light (like stretching or slow walking)   Do you engage in social activities? Yes   How do you rate your nutrition? Good   How would you describe the condition of your mouth and teeth, including false teeth or dentures? Excellent   Do you find yourself having trouble hearing people speak?  No   Do you always use your seat belt in the car?  Yes   Do you have a fire extinguisher in your home?  Yes   Do you have a smoke detector? Yes   Preparing food and eating I can do this myself   Bathing yourself I can do this myself   Getting dressed I can do this myself   Using the toilet I can do this myself   Moving around from place to place I can do this myself   Shopping I can do this myself   Using the telephone I can do this myself   Housekeeping I can do this myself   Laundry I can do this myself   Driving or using transportation I can do this myself   Managing own finances I can do this myself   Taking your own medications I can do this myself   (page 6 of 7)  Do you use any devices? Cane   In the past year, have you fallen or had a near fall? No   Are you afraid of falling?  No   Do you ever leak urine or stool? No   Are you sexually active? Yes   Do you have any sexual health concerns? No   Have you experienced any memory issues or problems with thinking? No   Have any concerns about your memory been raised by family members, friends, caretakers, or others?  No   POLST form (Physician  orders for life-sustaining treatment) Yes   Living will (documents that make your health care wishes know, also called Advance Directive) Yes   Durable Power of Attorney for Health Care (someone to make medical decisions for you in the event that you are unable to) Yes   Do you want to discuss advance care planning at your wellness visit?  Not sure

## 2023-10-04 NOTE — Progress Notes (Unsigned)
 ANNUAL WELLNESS VISIT    {Tip  Patient questionnaire (HRA) is REQUIRED.  Check for patient-entered responses in Synopsis, or ensure scanned form in Media tab. :999}    Ronnie Mason is a 73 year old male who presents for a subsequent Annual Wellness Visit.    Is this a face-to-face video or an audio-only visit?  No       INFORMATION GATHERING:   {Tip  REQUIRED; update each of the following and then check box below.  Start Review  Problem List  Medical Hx  Surgical Hx  Family Hx  Social Hx  Reconcile Meds (including outside medications)  Allergies :999}   The following areas were confirmed with patient/caregiver and/or updated in Epic at this visit:   Past Medical, Surgical, Family, and Social History  Current medications and supplements  Allergies  All of the above components have been reviewed and updated: yes    Opioid Use: {Tip  Required components for opioid screening  Review PDMP  If receiving opioids, fill out ORT  Previous ORT (if available) No ORT score found :999}  Patient not taking opioids    {Tip  It is REQUIRED to review substance use disorder risks during an AWV or IPPE encounter :999}   I have reviewed the patient's risk factors for other substance use disorders.  If appropriate, I've referred the patient for treatment.    {Tip  HRA viewable in Synopsis or scanned form in Media tab :999}  Please see today's HRA for review of patient's functional ability and level of safety. Concerns are addressed below in the Personalized Prevention Plan section.    For list of current providers and suppliers, see Care Team Section, EHR encounters, and/or HRA questionnaire for [facility] providers involved in care    Other providers and suppliers outside [facility]:   No outside providers identified      Depression screening: {Tip  REQUIRED  Click to input PHQ-9. Then press Ctrl+F5 to refresh note content. :999}   PHQ-9:       EXAM:     BP 135/86   Pulse 92   Temp 36.2 C (Temporal)   Ht 6'  3.98" (1.93 m)   Wt (!) 104.9 kg (231 lb 3.2 oz)   SpO2 94%   BMI 28.15 kg/m   {Tip  If current vitals don't display, press Ctrl+F11 to refresh.  Review Vitals:999}    General: no acute distress, pleasant  Head: Normocephalic. No masses, lesions, tenderness or abnormalities  Eyes: Lids/periorbital skin normal, Conjunctivae/corneas clear, PERRL, EOM's intact  Ears: External ears normal. Canals clear. TM's normal.  Oropharynx: normal mucosa, no uvular deviation  Neck: supple. No adenopathy. Thyroid symmetric, normal size, without nodules.   Lungs: clear to auscultation b/l, no wheezing  Heart: normal rate, regular rhythm and no murmurs, clicks, or gallops  Ext: no LE edema or skin discoloration  Skin: Skin color, texture, turgor normal. No rashes or concerning lesions  Psych: normal mood, normal thought process.    GAIT:   Normal, stable, independent    COGNITION: {Tip  REQUIRED for AWV  Consider Cognition Smartset  (contains screening tools such as Mini-Cog (.minicog), cognitive checklist (.cognitivechecklist), and MoCA (here, enter results here for tracking))  :999}      Intact    {Tip  Review Labs  Imaging :999}      ADVANCE CARE PLANNING (ACP)  {Tip  Optional. :999}     Advance care planning:  Offered, patient declined  Advance directives are on file in patient's chart    ASSESSMENT:  {Tip  Problem List  Visit Dx  Refill/DC Meds  HM Tracker  HM Orders  Follow-up  Patient Instructions :999}     Ronnie Mason was seen for his Annual Wellness Visit, including identification of risk factors & conditions that may affect his health and function in the future.     {Vanishing Tip  Ctrl+F11 to refresh :999}    [name] was seen today for wellness.    Need for vaccination    Encounter for Medicare annual wellness exam        Actions at this visit: {Tip  REQUIRED, confirm each of the following below. :999}     Established or updated a written schedule of screening and prevention measures recommended  and appropriate for Ronnie Mason for the next 5-10 years  Established or updated a list of his risk factors and conditions for which lifestyle or medical interventions are recommended or underway, including mental health risks and conditions, and including risks/benefits of treatment  Furnished personalized health advice and, as appropriate, referrals to health education or preventive counseling services or programs (such as fall prevention, tobacco cessation, physical activity, nutrition, cognition, weight loss)    All of the above components have been reviewed and updated. yes    Personalized Prevention Plan: {Tip  REQUIRED; personalize for this patient and copy into Patient Instructions :999}     Based on today's evaluation, I recommend the following ways to improve your health or functioning.    You have the following risk factors and/or medical conditions for which there are recommended ways (included in this list) to help you stay as healthy as possible:    Encounter for Medicare annual wellness exam  Eat a healthy diet (the mediterranean diet is one example of a good dietary plan), regular cardiovascular exercise, maintain a normal body weight, avoid cigarettes, keep alcohol consumption moderate, take measures to reduce the risk of falls, engage in social activities, take medications/supplements as directed, see a dental provider regularly, and use sunscreen & skin protection when outdoors to prevent skin cancer.      Need for shingles vaccine  The Shingrix vaccine for herpes zoster (shingles) is recommended, and you can contact your local pharmacy to schedule this vaccine series. For more information please see the following CDC link:   TextRun.co.nz.html    Ulcer of foot, limited to breakdown of skin, unspecified laterality (HCC)  Followed by Podiatry.     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     History of alcohol dependence (HCC)  Social EtOH.     Chronic renal impairment, stage 3a (HCC)  Stable, check labs.     Essential tremor  On Primidone 50 mg BID.     Cervicalgia  Lumbosacral radiculopathy at S1  Followed by Pain Management clinic.       Here are screening & prevention measures recommended for you:  Health Maintenance   Topic Date Due    Zoster Vaccine (3 of 3) 07/22/2020    Lipid Disorders Screening  03/25/2023    Depression Screening (PHQ-2)  09/01/2023    Medicare Annual Wellness Visit  09/01/2023    Colorectal Cancer Screening  09/30/2026    DTaP, Tdap and Td Vaccines (6 - Td or Tdap) 04/10/2032    Pneumococcal Vaccine: 50+ Years  Completed    Influenza Vaccine  Completed  Hepatitis C Screening  Completed    COVID-19 Vaccine  Completed    Hepatitis A Vaccine  Aged Out    Hepatitis B Vaccine  Aged Out    Meningococcal B Vaccine  Aged Out    HPV Vaccine  Aged Out       Please plan to have a Subsequent Annual Wellness Visit in 1 year.  {Vanishing Tip   ADDITIONAL BILLING GUIDANCE   If you wish to bill for additional services--   In the Level of Service activity, click  next to "Additional E/M codes"  Add applicable codes  16109 + 25 modifier - Advanced Care Planning (first 30 minutes)  U0454 + 25 modifier - Lung cancer screening counseling  G0101 + 25 modifier - Screening pelvic/clinical breast exam  G0403 + 25 modifier - Screening EKG (IPPE only)  99213 + 25 modifier (or level 4, level 5, etc) - Same-day problem visit for condition(s) beyond the scope of preventive care or refills of routine stable medications   :999}    -----------------------------------------------      HPI: Jahfari Ambers  is a 73 year old male who presents for:    1. Reports DOE x 1.5 years.   Shortness of breath when walking up stairs or hills.   Exercising less due to LE orthopedic issues.   Denies shortness of breath at rest.   Cardiology referral 08/24/23, has  appointment this week.  Denies CP, palpitations, wheezing, lightheadedness, syncope.       Problem list and medications reviewed and updated today.    PE:  BP 135/86   Pulse 92   Temp 36.2 C (Temporal)   Ht 6' 3.98" (1.93 m)   Wt (!) 104.9 kg (231 lb 3.2 oz)   SpO2 94%   BMI 28.15 kg/m   General: no acute distress, pleasant  Head: Normocephalic. No masses, lesions, tenderness or abnormalities  Eyes: Lids/periorbital skin normal, Conjunctivae/corneas clear, PERRL, EOM's intact  Ears: External ears normal. Canals clear. TM's normal.  Oropharynx: normal mucosa, no uvular deviation  Neck: supple. No adenopathy. Thyroid symmetric, normal size, without nodules.   Lungs: clear to auscultation b/l, no wheezing  Heart: normal rate, regular rhythm and no murmurs, clicks, or gallops  Ext: no LE edema or skin discoloration  Skin: Skin color, texture, turgor normal. No rashes or concerning lesions  Psych: normal mood, normal thought process.    ECG: NSR, normal.      A/P: 73 year old male who presents with:    Dyspnea on exertion  ***  Cardiology f/u.   - EKG 12-Lead; Future    Follow up as needed, or if symptoms worsen or progress.

## 2023-10-04 NOTE — Progress Notes (Signed)
 Consultation Note    Primary Care Provider: [name], MD    Referring Provider: No ref. provider found    DOB:  Feb 16, 1951    CHIEF COMPLAINT: HTN    HISTORY OF PRESENT ILLNESS:      73 year old male with PMHx of HTN, essential tremor, Lumbosacral radiculopathy, CKD stage III who presents for HTN.      Saw Cardiology on 10/2017 for Htn and preop eval. Was doing well.     Per patient, he is working on being active. He is trying to walk in the swimming pool.  Denies chest pain. But does endorse some SOB when he exerts himself, he notes that has been occurring as he works on conditioning himself. States that it is minimal.       MEDICATIONS:  Current Outpatient Medications   Medication Sig Dispense Refill    Acetaminophen 500 MG Oral Tab Take 3 tablets (1,500 mg) by mouth 2 times a day.      Alpha-Lipoic Acid 300 MG tablet Take 300 mg by mouth 2 times a day.      amLODIPine 5 MG tablet Take 1 tablet (5 mg) by mouth 2 times a day. 180 tablet 1    amoxicillin-clavulanate 875-125 MG tablet Take 1 tablet (875 mg) by mouth 2 times a day for 5 days. 10 tablet 0    atorvastatin 10 MG tablet Take 1 tablet (10 mg) by mouth daily. 90 tablet 1    BENFOTIAMINE OR Take 250 mg by mouth every morning.      betamethasone dipropionate 0.05 % lotion APPLY TO SCALP AND EARS TOPICALLY 2 TIMES A DAY AS NEEDED FOR ITCHING. Notify MD of any signs of infection or skin thinning 60 mL 1    Cholecalciferol (VITAMIN D3) 2000 units Oral Cap Take 1 capsule (2,000 Units) by mouth daily. For low level 1 capsule 1    clindamycin 1 % gel Apply 1 Application  topically 2 times a day as needed. Apply to back.      cyanocobalamin (Vitamin B-12) 1000 MCG tablet Take 1 tablet (1,000 mcg) by mouth daily.      diclofenac 1 % gel Apply 4 g topically 4 times a day. Apply to 2 gram four times daily to neck, trapezius muscle, and low back. Use a max of 32 grams a day. 300 g 4    diclofenac sodium 75 MG EC tablet Take 1 tablet (75 mg) by mouth 2 times a  day. Take with food 60 tablet 11    DULoxetine 60 MG DR capsule Take 1 capsule (60 mg) by mouth 2 times a day. 180 capsule 1    EPINEPHrine 0.3 MG/0.3ML auto-injector Inject one pen (0.3mg /mL) into the muscle for severe allergic reaction. Call 911. Use second pen if symptoms continue 2 each 0    lidocaine 5 % patch Apply 1 patch onto the skin daily as needed. Apply to painful area for up to 12 hours in a 24 hour period. 30 patch 3    lisinopril 20 MG tablet TAKE 1 TABLET BY MOUTH TWICE DAILY 200 tablet 1    MAGNESIUM OR Take 2 tablets by mouth at bedtime.      mirabegron ER (Myrbetriq) 50 MG 24 hr tablet Take 1 tablet (50 mg) by mouth daily. 100 tablet 3    multivitamin with minerals tablet Take 1 tablet by mouth daily.      naltrexone 50 MG tablet TAKE 1 TABLET BY MOUTH DAILY WITH  FOOD 30 tablet 0    Omega-3 Fatty Acids (OMEGA 3 OR) Take 1 tablet by mouth every morning.      pregabalin 300 MG capsule Take 1 capsule (300 mg) by mouth 2 times a day. 180 capsule 0    primidone 50 MG tablet Take 1 tablet (50 mg) by mouth 2 times a day. 180 tablet 4    Thiamine HCl (VITAMIN B-1 OR) 100 mg.       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, N13.8] 07/17/2022    Urge incontinence  [N39.41] 07/17/2022    Mallet toe, right [M20.5X1] 04/23/2022     Added automatically from request for surgery [id number]      Chronic bilateral low back pain without sciatica [M54.50, G89.29] 04/29/2021    Psychological factors affecting medical condition [F54] 04/24/2021    Macrocytosis without anaemia [D75.89] 02/12/2020    Myofascial pain [M79.18] 10/20/2019    MRSA (methicillin resistant staph aureus) culture positive [Z22.322] 07/13/2019    Closed fracture of one rib of right side [S22.31XA] 01/26/2019     See x ray 01/26/2019   Right anterior lat sub acute chronic       Enuresis [R32] 09/06/2018    Chronic left-sided low back pain without sciatica [M54.50, G89.29] 06/21/2018    Acute left-sided low back pain without sciatica [M54.50] 03/14/2018    Greater trochanteric pain syndrome of right lower extremity [M25.551] 11/25/2017    Complex tear of medial meniscus of left knee as current injury [S83.232A] 09/08/2017    Primary osteoarthritis of left knee [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] 09/08/2017    Right lumbar radiculitis [M54.16] 05/05/2017    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, 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] 04/13/2017    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] 01/11/2017     Osteoarthritis of right hip x ray report - moderate to advanced radiology read 01/11/2017      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21] 06/26/2015    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) [G37.9] 05/07/2015    Idiopathic peripheral neuropathy [G60.9] 03/15/2015    Cervical facet joint syndrome [M47.812] 05/28/2014    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] 01/18/2014    Tremor [R25.1] 04/20/2013    Fall [W19.XXXA] 02/23/2013     BP < 100  Occasioanal tri[p and leg weakness      Back pain, lumbosacral [M54.50] 02/15/2013    Lumbar radiculopathy, chronic [M54.16] 02/15/2013    Neck pain [M54.2] 02/15/2013    Chronic pain [G89.29] 12/21/2012     Now at [facility], DO, Santiago Glad    Neck pain -burn c3-7 on left side per patient  Some trigger injection    Lumbar pain-since 80's better with exercise      Bee sting allergy [Z91.030] 12/21/2012     hives      Numbness and tingling of leg [R20.0, R20.2] 12/21/2012     Left calf -left foot long term suspect from back-suspect L 45      Chronic back pain [M54.9, G89.29] 12/21/2012     Mri in mid scape 1/214  Multilevel degenerative disc disease of the lumbar spine. Circumferential disc bulge at all levels below and including L1-2. No significant central spinal canal stenosis or neuroforaminal narrowing.  2. Mild retrolisthesis of L5 on S1.            B12 deficiency [E53.8] 12/21/2012     Borderline low 5 /21/2014      Chronic renal insufficiency, stage III (moderate) (HCC) [N18.30] 12/21/2012     5/ 2013      Hyperlipidemia [E78.5] 09/26/2012    Cervicalgia [M54.2] 03/11/2010     Radiofrequency ablation of c3 to c 7         FAMILY HISTORY: None    SOCIAL HISTORY:   Smoke: None  Alcohol: 7 drinks per week  Illicit: None     REVIEW OF SYSTEMS: Review of systems has  been obtained and is negative except per HPI.    PHYSICAL EXAM    CONSTITUTIONAL: This is a well-nourished male in no distress.   BP 130/70   Pulse 96   Ht 6\' 4"  (1.93 m)   Wt (!) 106.5 kg (234 lb 12.8 oz)   SpO2 98% Comment: RA  BMI 28.58 kg/m   NEURO/PSYCH:  Oriented x 3 with normal affect. NECK:  Jugular venous pressure normal.  Thyroid is not enlarged.  RESPIRATORY:  Respiratory effort is normal.  Lungs are clear to percussion/auscultation.  CARDIOVASCULAR:  PMI at 5th ICS/MCL.  regular rhythm.  S1, S2 normal.  No S3, S4, no m/r/g.  Carotid upstrokes are brisk, without bruits.  Abdominal aorta normal to palpation, no bruit.  Femoral pulses normal, no bruits.  Pedal pulses normal bilaterally.  Extremities no pitting edema. EXTREMITIES:  No clubbing or cyanosis. SKIN:  Warm and dry, no lesions.    IMPRESSION:     #HTN  #HLD  #SOB    Presents today with mild SOB. Likely from weight gain and decodioning, however will obtain a TTE to assess LVEF. I want patient to continue exercising, if symptoms improve, then this will add further credence that this was deconditioning, however if symptoms worsen, will refer for pharm NMST.    Regarding HTN, he is  at goal today. Continue anti-HTN treatment.    For HLD, LDL and TAG is at goal.     - TTE  - Lisinopril 20mg  qday  - Amlodipine 5mg  qday  - Lipitor 10mg  qday    RTC 4 months     Freda Munro, MD

## 2023-10-05 ENCOUNTER — Ambulatory Visit (INDEPENDENT_AMBULATORY_CARE_PROVIDER_SITE_OTHER): Payer: Medicare PPO | Admitting: Podiatrist

## 2023-10-05 DIAGNOSIS — G629 Polyneuropathy, unspecified: Secondary | ICD-10-CM

## 2023-10-05 DIAGNOSIS — R0602 Shortness of breath: Secondary | ICD-10-CM

## 2023-10-05 LAB — EKG 12 LEAD
Atrial Rate: 74 {beats}/min
P Axis: 15 degrees
P-R Interval: 158 ms
Q-T Interval: 380 ms
QRS Duration: 72 ms
QTC Calculation: 421 ms
R Axis: 47 degrees
T Axis: 42 degrees
Ventricular Rate: 74 {beats}/min

## 2023-10-05 MED ORDER — GABAPENTIN 100 MG OR CAPS
100.0000 mg | ORAL_CAPSULE | Freq: Every evening | ORAL | 1 refills | Status: DC
Start: 2023-10-05 — End: 2023-10-18

## 2023-10-05 NOTE — Progress Notes (Signed)
 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   INJECTION:  Plantar fibroma intra lesional injection performed today - at midarch and near insertion site. 09/03/23- no improvment   The patient states of worsening.Patient reports pain is now 24/7  The current condition has existed for 4 year(s).    Patient describes the symptoms as numbness, pain with activity, pain with long standing, pain with pressure, stiffness, tender, and tingling.     The level the pain/severity of pain is 5/10 and 6/10.   The condition is worse when walking.     Current treatments/recent studies include   tylenol, wears Quickstride insert with plantar fascia groove , cortisone injection, and shoe - supportive.     The response to the treatment is no improvement.     Patient's activity limitation is limited in walking.

## 2023-10-05 NOTE — Progress Notes (Signed)
 Patient: Ronnie Mason   Patient DOB: 25-Nov-1950     DOS:  10/05/2023     Accompanied by:  patient was unaccompanied    Subjective:  (history is obtained by MA/RN and edited by 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   The prior injection did not help but does not recall the visit or injection.    The patient states of worsening.Patient reports pain is now 24/7  The current condition has existed for 4 year(s).    Patient describes the symptoms as numbness, pain with activity, pain with long standing, pain with pressure, stiffness, tender, and tingling.     The level the pain/severity of pain is 5/10 and 6/10.   The condition is worse when walking.     Current treatments/recent studies include   tylenol, wears Quickstride insert with plantar fascia groove , cortisone injection, and shoe - supportive.     The response to the treatment is no improvement.     Patient's activity limitation is limited in walking.      Goals: evaluate current condition    PCP:  [name], MD     PMH:   Active Ambulatory Problems     Diagnosis Date Noted    Syncope     Hypotension     Carotid Sinus Hypersensitivity     URI (upper respiratory infection)     Cervicalgia 03/11/2010    Hyperlipidemia 09/26/2012    Chronic pain 12/21/2012    Bee sting allergy 12/21/2012    Numbness and tingling of leg 12/21/2012    Chronic back pain 12/21/2012    B12 deficiency 12/21/2012    Chronic renal insufficiency, stage III (moderate) (HCC) 12/21/2012    Back pain, lumbosacral 02/15/2013    Lumbar radiculopathy, chronic 02/15/2013    Neck pain 02/15/2013    Fall 02/23/2013    Tremor 04/20/2013    Sensory neuropathy 01/18/2014    Cervical facet joint syndrome 05/28/2014    Idiopathic peripheral neuropathy 03/15/2015    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 [facility]) 05/07/2015    History of alcohol dependence (HCC) 06/26/2015    Cerebral ventriculomegaly  10/09/2015    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    Essential hypertension 12/16/2015    Essential tremor 12/16/2015    Hx of spontan intraparenchymal intracran bleed assoc with hypertension 12/16/2015    Alcohol abuse 12/16/2015    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    Balance problem 01/23/2016    Chronic pain of both shoulders 01/23/2016    Vitamin D deficiency 02/13/2016    Difficulty in walking, not elsewhere classified 03/12/2016    Impaired mobility and ADLs 04/08/2016    History of traumatic brain injury 08/06/2016    Primary osteoarthritis of right hip 01/11/2017    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    Right lumbar radiculitis 05/05/2017    Greater trochanteric pain syndrome of right lower extremity 11/25/2017    Complex tear of medial meniscus of left knee as current injury 09/08/2017    Primary osteoarthritis of left knee 09/08/2017    Acute left-sided low back pain without sciatica 03/14/2018    Chronic left-sided low back pain without sciatica 06/21/2018    Enuresis 09/06/2018    Closed fracture of one rib of right side 01/26/2019    MRSA (methicillin resistant staph aureus) culture positive 07/13/2019    Myofascial pain  10/20/2019    Macrocytosis without anaemia 02/12/2020    Psychological factors affecting medical condition 04/24/2021    Chronic bilateral low back pain without sciatica 04/29/2021    Mallet toe, right 04/23/2022    Neurogenic bladder 07/17/2022    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    Urge incontinence 07/17/2022    Cerebral microvascular disease 10/01/2022    Mixed incontinence 10/01/2022    Nasal bleeding 05/12/2023    Secondary hypertension 05/12/2023    Pain at surgical incision 09/15/2023    Ulcer of foot, limited to breakdown of skin, unspecified laterality (HCC) 10/04/2023    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     Resolved Ambulatory Problems     Diagnosis Date Noted     Lumbosacral radiculopathy at S1 03/22/2013    Intraparenchymal hemorrhage of brain (HCC) 01/06/2016    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) 01/15/2016    Pain of right hip joint 01/23/2016    Alcoholism in remission (HCC) 03/16/2019    Abscess of foot 12/02/2020    Cellulitis and abscess of foot 12/02/2020    Abscess 12/02/2020    Abscess of right foot 12/10/2020     Past Medical History:   Diagnosis Date    Allergic rhinitis due to allergen     Disc disorder of lumbosacral region     Hip injury     HYPERTENSION      Spondylosis of cervical region without myelopathy or radiculopathy 05/28/2014    Stroke [facility]) 2017    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[facility])         Review of patient's allergies indicates:  Allergies   Allergen Reactions    Adhesives Skin: Rash     Glue on lido patches    Bee Venom Skin: Hives, Skin: Itching and Swelling    Gabapentin Other     Flu like symptoms, diarrhea, body ache upset stomach    Methocarbamol Other     Patient's wife reports cognitive difficulties ("goofy")        Medications:   Outpatient Medications Prior to Visit   Medication Sig Dispense Refill    Acetaminophen 500 MG Oral Tab Take 3 tablets (1,500 mg) by mouth 2 times a day.      Alpha-Lipoic Acid 300 MG tablet Take 300 mg by mouth 2 times a day.      amLODIPine 5 MG tablet Take 1 tablet (5 mg) by mouth 2 times a day. 180 tablet 1    amoxicillin-clavulanate 875-125 MG tablet Take 1 tablet (875 mg) by mouth 2 times a day for 5 days. 10 tablet 0    atorvastatin 10 MG tablet Take 1 tablet (10 mg) by mouth daily. 90 tablet 1    BENFOTIAMINE OR Take 250 mg by mouth every morning.      betamethasone dipropionate 0.05 % lotion APPLY TO SCALP AND EARS TOPICALLY 2 TIMES A DAY AS NEEDED FOR ITCHING. Notify MD of any signs of infection or skin thinning 60 mL 1    Cholecalciferol (VITAMIN D3) 2000 units Oral Cap Take 1 capsule (2,000 Units) by mouth daily. For low level 1 capsule 1    clindamycin 1 % gel Apply 1 Application  topically 2  times a day as needed. Apply to back.      cyanocobalamin (Vitamin B-12) 1000 MCG tablet Take 1 tablet (1,000 mcg) by mouth daily.      diclofenac 1 % gel Apply 4 g topically 4 times a day. Apply to 2 gram  four times daily to neck, trapezius muscle, and low back. Use a max of 32 grams a day. 300 g 4    diclofenac sodium 75 MG EC tablet Take 1 tablet (75 mg) by mouth 2 times a day. Take with food 60 tablet 11    DULoxetine 60 MG DR capsule Take 1 capsule (60 mg) by mouth 2 times a day. 180 capsule 1    EPINEPHrine 0.3 MG/0.3ML auto-injector Inject one pen (0.3mg /mL) into the muscle for severe allergic reaction. Call 911. Use second pen if symptoms continue 2 each 0    lidocaine 5 % patch Apply 1 patch onto the skin daily as needed. Apply to painful area for up to 12 hours in a 24 hour period. 30 patch 3    lisinopril 20 MG tablet TAKE 1 TABLET BY MOUTH TWICE DAILY 200 tablet 1    MAGNESIUM OR Take 2 tablets by mouth at bedtime.      mirabegron ER (Myrbetriq) 50 MG 24 hr tablet Take 1 tablet (50 mg) by mouth daily. 100 tablet 3    multivitamin with minerals tablet Take 1 tablet by mouth daily.      naltrexone 50 MG tablet TAKE 1 TABLET BY MOUTH DAILY WITH FOOD 30 tablet 0    Omega-3 Fatty Acids (OMEGA 3 OR) Take 1 tablet by mouth every morning.      pregabalin 300 MG capsule Take 1 capsule (300 mg) by mouth 2 times a day. 180 capsule 0    primidone 50 MG tablet Take 1 tablet (50 mg) by mouth 2 times a day. 180 tablet 4    Thiamine HCl (VITAMIN B-1 OR) 100 mg.       Facility-Administered Medications Prior to Visit   Medication Dose Route Frequency Provider Last Rate Last Admin    sodium chloride 0.9 % infusion  50 mL/hr Intravenous Continuous [place] Nickel, MBBS            PSH:   Past Surgical History:   Procedure Laterality Date    L meniciscus Left 07/2017    by Dr Marcelle Overlie , re did left menisicus     PR ANES; COLONOSCOPY  2002    repeat in 3 years    PR ANES; COLONOSCOPY & POLYPECTOMY  11/18/2007    repeat in 3  years    PR CORRECTION HAMMERTOE Right 05/13/2022    Dr. Hilarie Fredrickson, DPM    PR HALLUX RIGIDUS W/CHEILECTOMY 1ST MP JT W/O IMPLT Right 10/21/2017    Dr. Donn Pierini    PR UNLISTED PROCEDURE FEMUR/KNEE      PR UNLISTED PROCEDURE HANDS/FINGERS      PR UNLISTED PROCEDURE SPINE  2013    rfa to c3 to c 7     SURGICAL HX OTHER Right 12/03/2020    RIGHT foot WOUND INCISION AND DRAINAGE    TOE SURGERY Right 02/09/2019    toe surgery  Right 01/12/2019    Dr Hilarie Fredrickson         Family History:  family history includes Colon Cancer in his father; Heart (other) in his mother; Other Family Hx in an other family member; Suicide Attempt in his brother. There is no history of Cancer.    Social History:  Social History     Socioeconomic History    Marital status: Married     Spouse name: [name]    Number of children: 2    Years of education: Not on file    Highest education  level: Not on file   Occupational History    Not on file   Tobacco Use    Smoking status: Never    Smokeless tobacco: Never    Tobacco comments:     Sometimes chews, but is trying to quit. 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  Non-smoker, 14 or more drinks/week almost exclusively wine with dinner.      2024 still retired           Haematologist Insecurity: Not on Occupational hygienist Needs: Not on file   Intimate Partner Violence: Not on file   Housing Stability: Not on file        Physical Exam:   There were no vitals filed for this visit.     General: The patient is 73 year old year old male White   with constitutional symptom: appears age stated.    Psychiatric/mood: normal mood and affect.    Neurological: alert and oriented x3.    Vascular:   RIGHT:  Dorsalis pedis pulse is palpable.  Posterior tibial pulse is palpable. Capillary filling time note to be immediate on the distal hallux.      Neurologic:  RIGHT:  The sensation is diminished on the foot and ankle.   Motor coordination is intact with normal muscle tone.      Dermatological:   RIGHT:  Upon inspection, the skin note to have normal texture/tone/turgor.  There is no ulceration/open wound or dermatosis present.  Integument coloration is within normal range and temperature is within normal range.      Musculoskeletal:   RIGHT:  There is mild-moderate and general pain on palpation of the plantar midfoot and plantar forefoot. There is no mild swelling noted on the hallux IPJ related arthritis.  There is no warmth to touch and ecchymosis on the affected area.       Assessment:   (G62.9) Neuropathy  (primary encounter diagnosis)    Plan:  Detailed education and discussion took place with the patient regards to neuropathy, bilateral.  Discussion included possible causes such as (i.e. diabetes, Drug/toxin, autoimmune diseases-lupus, RA, exposure to poison, vitamin deficiencies(B1, B12) inherited/genetic - Fabry's disease, infections (HIV), tumor related - paraneoplastic).  The diagnostic work up entails lab test (thyroid, blood sugar, vit. B etc), nerve biopsy, skin biopsy or neurology consultation. After discussing the options, patient elected to: Capsaicin cream(avoid rubbing the eye) and Neurontin/gabapentin.       He has tried gabapentin which caused GI symptom.  He elected to try low dose 100 mg of gabapenin.      Following prescription was given:  Neurontin/gabapentin 100 mg  qhs.          Return appointment: follow up in 2 months    [name] DPM, FACFAS  Reconstructive Ankle and Foot  Surgeon  [facility]

## 2023-10-05 NOTE — Patient Instructions (Signed)
Apply with glove  Wash hands after  Avoid rubbing the eyes

## 2023-10-06 ENCOUNTER — Ambulatory Visit: Admitting: Anesthesiology

## 2023-10-06 ENCOUNTER — Encounter (HOSPITAL_BASED_OUTPATIENT_CLINIC_OR_DEPARTMENT_OTHER): Payer: Self-pay | Admitting: Anesthesiology

## 2023-10-06 VITALS — BP 105/74 | HR 91 | Temp 97.6°F | Ht 76.0 in | Wt 229.0 lb

## 2023-10-06 DIAGNOSIS — Z5309 Procedure and treatment not carried out because of other contraindication: Secondary | ICD-10-CM

## 2023-10-06 DIAGNOSIS — M47812 Spondylosis without myelopathy or radiculopathy, cervical region: Secondary | ICD-10-CM

## 2023-10-06 DIAGNOSIS — M542 Cervicalgia: Secondary | ICD-10-CM

## 2023-10-06 LAB — PRIMIDONE AND PHENOBARBITAL
Phenobarbital: <2.4 ug/mL — ABNORMAL LOW (ref 10.0–40.0)
Primidone (Sendout): 3.7 ug/mL — ABNORMAL LOW (ref 5.0–12.0)

## 2023-10-06 NOTE — Progress Notes (Signed)
 Brief note:    In summary: Mr. Shurley was  last seen by Dr. Dorna Bloom on 09/08/2023 for C5, C6 and C7 medial branches Radiofrequency ablation under fluoroscopy guidance and IV sedation; left. However, it was aborted due to inadequate length of RFA needle (see the detail of procedural note).     In brief, during the procedure on 09/08/2023:  Successful lesion of only C5 medial branch, due to reasons mentioned above, we decided not to proceed with C6 and C7 radial frequency ablation.  Small incision made to remove indwelling trocar outer sheath  5 sutures were placed to close procedure incision  Indwelling trocar outer sheath was successfully removed, confirmed by repeated fluoroscopy images.     Interval medical history:  Today, it is 1 month since his aborted RFA. Last week, he visited here to check his incision due to some redness around the wound, secretion and retained dissolvable suture. The wound was cleaned and the suture was removed by Simon Rhein during that visit.    Today, he visited [place] again to check the wound, which seem well healed and clean. See picture here:      Given re-assured physical exam, there is no concerns of wound infection.  With this opportunity, we answered all his questions and explained the procedural plan for a better procedural experience. He was satisfactory with the disclosure, and express the willingness to continue the planned RFA.    Due to minimal medical intervention during today's visit. It counts as non-billing.    Wendi Maya, MD    Interventional Pain Specialist  [facility]    Notation: This note has been generated with a voice recognition dictation software and electronically signed.

## 2023-10-08 ENCOUNTER — Ambulatory Visit (INDEPENDENT_AMBULATORY_CARE_PROVIDER_SITE_OTHER): Payer: Medicare PPO | Admitting: Acupuncturist

## 2023-10-08 ENCOUNTER — Ambulatory Visit (INDEPENDENT_AMBULATORY_CARE_PROVIDER_SITE_OTHER): Payer: Medicare PPO | Admitting: Cardiovascular Disease

## 2023-10-08 VITALS — BP 130/70 | HR 96 | Ht 76.0 in | Wt 234.8 lb

## 2023-10-08 DIAGNOSIS — E782 Mixed hyperlipidemia: Secondary | ICD-10-CM

## 2023-10-08 DIAGNOSIS — E663 Overweight: Secondary | ICD-10-CM

## 2023-10-08 DIAGNOSIS — I10 Essential (primary) hypertension: Secondary | ICD-10-CM

## 2023-10-08 DIAGNOSIS — R0602 Shortness of breath: Secondary | ICD-10-CM

## 2023-10-15 ENCOUNTER — Ambulatory Visit (INDEPENDENT_AMBULATORY_CARE_PROVIDER_SITE_OTHER): Payer: Medicare PPO | Admitting: Acupuncturist

## 2023-10-18 ENCOUNTER — Other Ambulatory Visit (INDEPENDENT_AMBULATORY_CARE_PROVIDER_SITE_OTHER): Payer: Self-pay | Admitting: Podiatrist

## 2023-10-18 ENCOUNTER — Other Ambulatory Visit (INDEPENDENT_AMBULATORY_CARE_PROVIDER_SITE_OTHER): Payer: Self-pay

## 2023-10-18 DIAGNOSIS — G629 Polyneuropathy, unspecified: Secondary | ICD-10-CM

## 2023-10-18 MED ORDER — GABAPENTIN 100 MG OR CAPS
100.0000 mg | ORAL_CAPSULE | Freq: Every evening | ORAL | 1 refills | Status: DC
Start: 2023-10-18 — End: 2023-10-18

## 2023-10-18 NOTE — Telephone Encounter (Signed)
 error

## 2023-10-18 NOTE — Addendum Note (Signed)
 Addended by: Debarah Crape on: 10/18/2023 03:16 PM     Modules accepted: Orders

## 2023-10-18 NOTE — Telephone Encounter (Signed)
 Patient called and would like refill of gabapentin. He reports no side affects    Medication Requested: gabapentin, by patient  Diagnosis: (G62.9) Neuropathy (primary encounter diagnosis)   Last fill: 10/05/23  Last UA Drug Screen (if applicable): NA  Last signed pain contract (if applicable): NA  Last surgery/last seen (as applicable): 10/05/23  Last visit Telemedicine?: No

## 2023-10-19 MED ORDER — GABAPENTIN 100 MG OR CAPS
100.0000 mg | ORAL_CAPSULE | Freq: Two times a day (BID) | ORAL | 1 refills | Status: DC
Start: 2023-10-19 — End: 2023-11-15

## 2023-10-22 ENCOUNTER — Ambulatory Visit (INDEPENDENT_AMBULATORY_CARE_PROVIDER_SITE_OTHER): Payer: Medicare PPO | Admitting: Acupuncturist

## 2023-10-29 ENCOUNTER — Ambulatory Visit (INDEPENDENT_AMBULATORY_CARE_PROVIDER_SITE_OTHER): Payer: Medicare PPO | Admitting: Acupuncturist

## 2023-11-04 ENCOUNTER — Other Ambulatory Visit (INDEPENDENT_AMBULATORY_CARE_PROVIDER_SITE_OTHER): Payer: Self-pay | Admitting: Family Medicine

## 2023-11-04 ENCOUNTER — Encounter (INDEPENDENT_AMBULATORY_CARE_PROVIDER_SITE_OTHER): Payer: Self-pay | Admitting: Family Medicine

## 2023-11-04 DIAGNOSIS — Z91030 Bee allergy status: Secondary | ICD-10-CM

## 2023-11-04 DIAGNOSIS — L309 Dermatitis, unspecified: Secondary | ICD-10-CM

## 2023-11-05 ENCOUNTER — Encounter (INDEPENDENT_AMBULATORY_CARE_PROVIDER_SITE_OTHER): Payer: Medicare PPO | Admitting: Internal Medicine

## 2023-11-05 MED ORDER — TRIAMCINOLONE ACETONIDE 40 MG/ML IJ SUSP
40.0000 mg | Freq: Once | INTRAMUSCULAR | Status: DC
Start: 2023-11-05 — End: 2024-05-18

## 2023-11-05 MED ORDER — BETAMETHASONE DIPROPIONATE 0.05 % EX LOTN
TOPICAL_LOTION | CUTANEOUS | 1 refills | Status: DC
Start: 2023-11-05 — End: 2024-01-15

## 2023-11-05 NOTE — Progress Notes (Unsigned)
 PROCEDURE NOTE  Cervical Facet Medial Branch Nerve Radiofrequency Neurotomy  C5 and C6 and C7 medial branches Radiofrequency ablation under fluoroscopy guidance; right.     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  Significant impairment of her daily functionality (driving, exercise), and quality of life.  Excellent and positive previous diagnostic nerve blocks.  After full disclosure of benefit/risks associated with procedure, patient agreed with procedure, and signed IC.    CONSENT:    Potential risks including pain, serious infection, paralysis, nerve injury, seizure, allergic reaction, and others were discussed with the patient. The patient was given and read a patient information sheet regarding injection procedures prior to today's injection. The patient understands the benefits, risks, and alternatives of the procedure and agrees to the procedure today.  Written informed consent was obtained.     ATTENDING PHYSICIAN  Wendi Maya, M.D.    FELLOW / RESIDENT  Velinda Wrobel, DO    SEDATION:  YES    NPO STATUS:  YES    INTRAVENOUS LINE  YES    IMAGING TECHNIQUE: Fluoroscopic guidance was used for this procedure, appropriate images were viewed and saved.     TIME OUT/FINAL VERIFICATION: performed and documented.    POSITION:  Prone    PREP/DRAPE:  Chloroprep and sterile drapes.  Sterile technique used throughout.     MONITORING: Pulse oxymetry.    TIME-OUT: Performed.    DESCRIPTION OF THE PROCEDURE  Preparation  Position: Patient in right lateral decubitus with the head and neck fully supported with pillow with her cervical spine straight and neutral.  Imaging technique: Multiplanar fluoroscopic guidance, appropriate images reviewed and saved.  Monitoring: Heart rate, pulse oximetry, blood pressure.  Time-out/final verification: Performed.  The injection area was prepped with Chloroprep and draped in a  sterile fashion; sterile technique used for the entire procedure.    The C5 and C6 medial branches Radiofrequency ablation under fluoroscopy guidance;  right.    An true lateral fluoroscopic image allowed symmetric visualization of target C5-C6 facet joint line (right).  An entry point to midpoint of C5-C6 facet joint line was identified and and the skin was anesthetized with 1% lidocaine without added sodium bicarbonate. Trocar with stylet was first inserted intermittent to contact the joint with coaxial technique under fluoroscopic guidance. After the Trocar was stabilized and maintained depth and orientation, the stylet was removed. A (2mm active tip) cervicool RF probe 17 gauge were inserted. Multiplanar images confirmed position of the needle tips just across the C5-C6 facet joint space without entering the joint space.     Negative aspiration for blood or CSF was confirmed. Sensory stimulation at 50Hz  below 0.5V was achieved. Motor stimulation at 2Hz  up to 1.5V did not cause any radicular symptoms. Additional oblique and A-P view confirmed the correct probe location with probe tip 2 mm away from bone surface on AP view and away from important structures. The target area was anesthetized with 1 cc of 0.5% bupivacaine and ***mg kenalog for procedure pain control.  Radiofrequency lesioning was performed for 150 seconds (2.5 min) at 60 degrees centrally, and projecting >80 degrees peripherally for larger lesion.  The trocar and cervicool probe were removed and bleeding was nil.       The C7 medial branches Radiofrequency ablation under fluoroscopy guidance; right.    An true lateral fluoroscopic image allowed symmetric visualization of target lateral mass (right).  An entry point to centroid of lateral mass at C7 was identified and and the skin was anesthetized with 1% lidocaine without added sodium bicarbonate. Trocar with stylet was first inserted intermittent to contact centroid with coaxial technique  under fluoroscopic guidance. After the Trocar was stabilized and maintained depth and orientation, the stylet was removed. A (2mm active tip) cervicool RF probe 17 gauge were inserted. Multiplanar images confirmed position of the needle tips just above the centroid of the lateral mass of C7.       Negative aspiration for blood or CSF was confirmed. Sensory stimulation at 50Hz  below 0.5V was achieved at every level. Motor stimulation at 2Hz  up to 1.5V did not cause any radicular symptoms at any level. Additional oblique and A-P view confirmed the correct probe location with probe tip 2 mm away from bone surface on AP view and away from important structures. Each level was anesthetized with 1 cc of 0.5% bupivacaine and ***mg kenolog for procedure pain control.  Radiofrequency lesioning was performed for 150 seconds (2.5 min) at 60 degrees centrally, and projecting >80 degrees peripherally for larger lesion.  The trocar and cervicool probe were removed and bleeding was nil.  A sterile dressing was applied. No specimens collected.The patient was taken to the Post-block Recovery Area for further observation.    Stimulation Results:  TON   = Sensory positive @ 0.5    , Motor negative @ 1.5  C3   = Sensory positive @ 0.5    , Motor negative @ 1.5  C4   = Sensory positive @ 0.5   , Motor negative @ 1.5    COMPLICATIONS  None.    POST-PROCEDURE / INTERPRETATION  Mr. Poffenberger was escorted to the recovery area in stable condition where him made an uneventful recovery.    Mr. Mccormac was reassessed after the procedure.  The pain score was ***/10 before the procedure and ***/10 prior to discharge (0 = no pain, 10 = worst pain imaginable). M@ Stellmach denies any neurological deficit and there is no particular concerns during immediate post-procedural recovery period.  The effect of the treatment cannot be typically evaluated early.    PLAN  The patient was informed that it may take 4-6 weeks until the treatment works.  A short  scripts of oxycodone 5mg  for total of 10 tabs for post-procedural pain were handed over to him.  Post-procedural pain was instructed to managed first with ice pads for the first 24 hrs, then heating pad for next few day.  The first line will be OTC NSAIDs, +/- tylenol, could take oxycodone 5mg  q6 prn for BTP if necessary.  Warning signs of potential complications were explained to patient, but very unlikely given normal immediate post-procedure assessment.  Pain diary in one week and follow-up with Dr. Dorna Bloom.     Fellow/resident: Amya Hlad, DO  Title: PGY5 Chronic Pain Fellow

## 2023-11-09 MED ORDER — EPINEPHRINE 0.3 MG/0.3ML IJ SOAJ
INTRAMUSCULAR | 2 refills | Status: AC
Start: 2023-11-09 — End: ?

## 2023-11-09 NOTE — Telephone Encounter (Signed)
 Pt sent MyChart message requesting refill.    Last fill of   EPINEPHrine 0.3 MG/0.3ML auto-injector  was on 10/21/2022 for #2 with 0 refills  Last OV was on 10/04/2023 with Dr. Zigmund Daniel.    Pt requesting 2 sets of 2.    Pharmacy and medication loaded below. Ok to refill?

## 2023-11-10 ENCOUNTER — Ambulatory Visit (HOSPITAL_BASED_OUTPATIENT_CLINIC_OR_DEPARTMENT_OTHER): Payer: Medicare PPO | Admitting: Anesthesiology

## 2023-11-10 DIAGNOSIS — M47812 Spondylosis without myelopathy or radiculopathy, cervical region: Secondary | ICD-10-CM

## 2023-11-10 DIAGNOSIS — M542 Cervicalgia: Secondary | ICD-10-CM

## 2023-11-14 NOTE — Telephone Encounter (Signed)
 Informed Pt of refill.    Nothing Further Needed    Closing Encounter

## 2023-11-15 ENCOUNTER — Telehealth (INDEPENDENT_AMBULATORY_CARE_PROVIDER_SITE_OTHER): Payer: Self-pay | Admitting: Podiatrist

## 2023-11-15 DIAGNOSIS — G629 Polyneuropathy, unspecified: Secondary | ICD-10-CM

## 2023-11-15 MED ORDER — GABAPENTIN 100 MG OR CAPS
100.0000 mg | ORAL_CAPSULE | Freq: Two times a day (BID) | ORAL | 1 refills | Status: DC
Start: 2023-11-15 — End: 2024-05-18

## 2023-11-15 NOTE — Telephone Encounter (Signed)
 Refill Request:    Medication and dosage: gabapentin 100 MG capsule [[id number]]   Pick up location: Pharmacy  Pharmacy information:   [facility] #[id number] [place] [phone] [id number]  [address] [place], [place] [address]  Phone: [phone]  Fax: [phone]  DEA #: --     Additional information: Pt would like to pick up refills ASAP. He is currently out. He would like a call when it is sent.  Return Call: Detailed message on voicemail

## 2023-11-24 ENCOUNTER — Encounter (INDEPENDENT_AMBULATORY_CARE_PROVIDER_SITE_OTHER): Admitting: Podiatrist

## 2023-12-02 ENCOUNTER — Encounter (HOSPITAL_BASED_OUTPATIENT_CLINIC_OR_DEPARTMENT_OTHER): Payer: Self-pay | Admitting: Anesthesiology

## 2023-12-02 ENCOUNTER — Ambulatory Visit (HOSPITAL_BASED_OUTPATIENT_CLINIC_OR_DEPARTMENT_OTHER): Admitting: Anesthesiology

## 2023-12-02 ENCOUNTER — Encounter (HOSPITAL_BASED_OUTPATIENT_CLINIC_OR_DEPARTMENT_OTHER): Payer: Self-pay

## 2023-12-02 ENCOUNTER — Ambulatory Visit: Attending: Anesthesiology | Admitting: Anesthesiology

## 2023-12-02 VITALS — BP 147/98 | HR 78 | Temp 97.0°F | Ht 76.0 in | Wt 225.0 lb

## 2023-12-02 DIAGNOSIS — M47816 Spondylosis without myelopathy or radiculopathy, lumbar region: Secondary | ICD-10-CM | POA: Insufficient documentation

## 2023-12-02 DIAGNOSIS — M542 Cervicalgia: Secondary | ICD-10-CM | POA: Insufficient documentation

## 2023-12-02 DIAGNOSIS — M5417 Radiculopathy, lumbosacral region: Secondary | ICD-10-CM | POA: Insufficient documentation

## 2023-12-02 NOTE — Progress Notes (Signed)
 I saw and evaluated the patient with Dr. Dois, who conducted the initial history.  I reviewed and confirmed the history in detail with [name] in person.   I was present for the examination and formulation portions of the encounter. I agree with the findings and the plan of care as documented in our notes. I did edit the trainee's note.      I spent a total of 47 minutes for the patient's care on the date of the service.         Dwanda Tufano, MD  Medical Director   [facility]  [facility]   Department of Anesthesiology and Pain Medicine

## 2023-12-02 NOTE — Progress Notes (Deleted)
 CHIEF COMPLAINT:   No chief complaint on file.      Last seen 10/06/2023 Ronnie Islam, MD for R Cervical Facet Medial Branch Nerve Radiofrequency Neurotomy  C5 and C6 and C7.   Was last seen by Dr. Maryln Sober  04/01/23: From that visit:     Diagnostic evaluation:   Patient with minimal improvement following caudal ESI, not candidate for repeat caudal ESI. Patient interested in pursuing any intervention that will delay lumbar spine surgery. Upon review of MRI L Spine (09/02/22), suboptimal anatomy for both transforaminal ESI and L4-5 ESI. Patient desires to pursue a LESI at a more cephalad level with attempt to place catheter for caudal injection.      Further options of lumbar spine surgery and lumbar SCS were discussed with patient. He was relatively uninterested at this time but willing to reestablish with Dr. Meredeth Stallion (neurosurgery) for discussion of surgical options for lumbar spine.      Rehabilitation:   Continue acupuncture     Medication suggestions:   Continue pregabalin  300mg  BID  Continue duloxetine  60mg  BID  Continue lidocaine  patch     Procedure suggestions:   Plan for diagnostic MBB at bilateral C5-6, C6-7 levels    Interval history:  Neck: Since last seeing the patient they have undergone cervical RFA with *** benefit.   RFA aborted due to inadequate length of RFA needle (see the detail of procedural note) however they were able to lesion C5 mbb  Mbb1 successful  Mbb2 aborted 2/2 epistaxis    Low back:   The patient reports intermittent spasms localized to the left lower paraspinal region. He infrequently experiences radiation of pain into the left lower extremity. He endorses a chronic history of bilateral peripheral neuropathy, which remains stable but persistently symptomatic. He also reports generalized weakness and impaired balance, necessitating the use of a cane for ambulation.  Saw Dr. Phyllis Breeze 06/18/23: "gait and fine motor issues may signify worsening myelopathy and may need surgical management. He will get  his RFAs and follow-up with me afterwards to discuss more. I also recommended PT for his lumbar spine and provided him with a referral."     Accupuncture helpful, letter of necessity provided    Current medications related to pain and related conditions include:  Pregabalin  300mg  bid  Duloxetine  60mg  bid  Lidocaine  5% patch    Lower back:  Injections in the sacral region, which did help.   R sided, localized to specific point ~L3/4 facet. Spastic feeling which is better with sitting, worse with turning and twisting and he gets shooting sensatino down his legs. 5-6/10 today. Denies numbness and tingling down the legs. Using a cane for walking.  Occasionally a very severe pain down the legs into the feet.    Past pain treatments/diagnostic testing include (copied forward and updated from the [facility] note dated 03/08/2023):   Dr. Liem  03/23/2018 - LEFT L5 TFESI with 80-85% relief for about 3-4 weeks of just axial back pain.   09/15/2017 - Bilateral L5 TFESI with 50-75% relief of mixture of low back and leg pain.   05/19/2017 - RIGHT L5 TFESI with 100% relief of Radicular symptoms for about 3 months  Dr. Eddie Good  Injections in our clinic have included (exluding MBB and trigger point injections):  08/12/2016 - L5-S1 ILESI  07/01/2016 - Ultrasound guided right trochanteric bursa injection   05/20/2016 - Bilateral shoulder injections  05/13/2016 - Right L3 L4 L5/S1 RFA  02/19/2016 -  Left L3,  L4, L5/S1 RFA  12/27/2012 - L5-S1 IL ESI  08/31/2012 - Left L5 TFESI  03/30/2012 - Left L4/5, L5/S1 facet inj  02/10/2012 - Left C6 C7 MB RFA  07/03/2011 - Left L3, L4, L5/S1 RFA  05/12/2011 - Left C4, C5, C6 RFA  Labs/Imaging Reviewed:  None    REVIEW OF SYSTEMS:  The review of systems documented by our clinic staff was provided by the patient. I did review the ROS and have no further detail to add.    PHYSICAL EXAMINATION:  There were no vitals taken for this visit.   Constitutional:Constitutional: well-appearing, in no  distress, well-nourished, and cooperative   Skin, exposed parts: color normal, no evidence of bleeding or bruising, no edema  Eyes: Negative for miosis bilaterally. Pupils appropriate for room lighting. Extraocular movements are intact bilaterally  Cardiovascular: No cyanosis, no dilation of jugular veins, no dyspnea. Bilateral radial pulses are intact and regular   Respiratory: Respirations are unlabored. No accessory muscle use noted  Neurologic: CN II-XII grossly intact  Musculoskeletal: Normal ROM and strength grossly  Psychiatric: Judgement/insight are intact. Mood is good. Affect is congruent. Patient is oriented to person, place and time and situation ***    IMPRESSION:   Ronnie Mason is a 73 year old male with   1. Progressive left neck pain with radiation to left lateral upper arm.  Exam and EMG consistent with C5 and C6 radiculopathy. Evaluated by Dr. Meredeth Stallion on 09/01/22 and determined to be likely candidate for C4-6 ACDF. Undergoing diagnostic MBB at b/l C5-6,C6-7 in effort to delay surgery.  Patient to delay surgery. . Now currently stable, continuing with MBB pathway.  2. Lower back pain multifactorial including lumbar facet arthropathy no longer responsive to mbb pathway, spinal stenosis, DDD. Pain significantly worsening an impacting function.   3. Right hip pain 2/2 severe OA  4. Prior treatment failures including Short duration of benefit for interlaminar lumbar epidural steroids and TFESI, MBB, memantine , Naltrexone .   5. 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  Mr. Coutts had no further questions.    Medication suggestions:   Continue pregabalin  300mg  BID  Continue duloxetine  60mg  BID  Continue lidocaine  patch    Procedure suggestions:   Can consider spinal cord stimulation for lower back and leg symptoms depending on surgical options     Continue with acupuncture     Follow-up: Follow up with Dr. Meredeth Stallion regarding lower back

## 2023-12-02 NOTE — Telephone Encounter (Signed)
 RETURN CALL: Voicemail - General Message      SUBJECT:  General Message     MESSAGE: Patient returned the call and advise that he will take the 9am appointment for 12/03/2023. Please contact the patient to confirm.

## 2023-12-02 NOTE — Progress Notes (Signed)
 Review of Systems   Musculoskeletal:  Positive for arthralgias, back pain, gait problem and myalgias.   Neurological:  Positive for tremors and weakness.   All other systems reviewed and are negative.

## 2023-12-02 NOTE — Telephone Encounter (Signed)
 Ronnie Mason return call to patient and rescheduled appointment to today, 5/1 @ 1 PM

## 2023-12-02 NOTE — Progress Notes (Signed)
 CHIEF COMPLAINT:   Chief Complaint   Patient presents with    Pain     Increase in pain and interested in new procedures       Last seen 10/06/2023 Alena Agent, MD for R Cervical Facet Medial Branch Nerve Radiofrequency Neurotomy  C5 and C6 and C7.   Was last seen by Dr. Gwinda  04/01/23: From that visit:     Diagnostic evaluation:   Patient with minimal improvement following caudal ESI, not candidate for repeat caudal ESI. Patient interested in pursuing any intervention that will delay lumbar spine surgery. Upon review of MRI L Spine (09/02/22), suboptimal anatomy for both transforaminal ESI and L4-5 ESI. Patient desires to pursue a LESI at a more cephalad level with attempt to place catheter for caudal injection.      Further options of lumbar spine surgery and lumbar SCS were discussed with patient. He was relatively uninterested at this time but willing to reestablish with Dr. Fernand (neurosurgery) for discussion of surgical options for lumbar spine.      Rehabilitation:   Continue acupuncture     Medication suggestions:   Continue pregabalin  300mg  BID  Continue duloxetine  60mg  BID  Continue lidocaine  patch     Procedure suggestions:   Plan for diagnostic MBB at bilateral C5-6, C6-7 levels    Interval history:  Has not had any further interventions since the RFA of his cervical spine. Feels like his pain is much more manageable and he is not interested in interventions at this time.    Low back:   Has had low back pain since 1985 when he was lifting weights, twisted something at that time. Pain has gotten much worse in the past month and a half which is affecting his walking, now requires a cane. Pain is super dull and pain moves down the right leg to the knee as well as shooting up his back up to his shoulder blades. Right foot is quite painful, previously had multiple surgeries. Denies any new weakness,    Felt like he got some relief from the previous ESI's back in August and April of 2024.  Wants to get off  gabapentin , which was started by Dr. Luke (podiatrist), makes him feel stupid. Has previously tried tizanidine  and it was unhelpful (possibly due to effects with drinking). Tried acupuncture for at least a year, most recently in December. Most recent physical therapy    Current medications related to pain and related conditions include:  Gabapentin  100 mg BID  Pregabalin  300mg  bid  Naltrexone  50 mg  Diclofenac  tablet 75 mg BID   Duloxetine  60mg  bid  Lidocaine  5% patch  Tylenol  PRN    Past pain treatments/diagnostic testing include (copied forward and updated from the [facility] note dated 03/08/2023):   Dr. Alena  09/08/23 - attempted RFA to Left  C5, C6, C7. Only C5 completed due to complication of broken canula    Dr. Gwinda  06/04/23 - Nerve block of the facet joint C5-6, C6-7 #2; side: bilateral   04/22/23 - LESI L1-2  03/04/23 - Caudal epidural steroid injection (required small volume)  12/04/22 - Cervical epidural steroid injection, C7-T1  05/08/22 - Trigger point injection to neck and shoulders  02/17/22 - Trigger point injection to lower back  01/09/22 - Cervical epidural steroid injection, C6-C7  10/04/21  - Cervical epidural steroid injection, C7-T1  03/14/21 - Medial branch blocks targeting the left L4-L5, L5-S1 lumbar facet joints (insufficient benefit)  10/18/20 - Trigger  point injections to lower back  02/22/20 - LESI L4-5  11/03/19 - Trigger point injections to lower back and thoracic back  10/20/19 - Trigger point injections to lower back and thoracic back  10/06/19 - Trigger point injections to lower back and gluteals  05/25/19 - Medial branch blocks to the bilateral L4-L5, L5-S1 facet joints (did not proceed to RFA due to onset of radicular symptoms and worsening pain)   05/11/19 - Medial branch blocks to the bilateral L4-L5, L5-S1 facet joints  Dr. Lafrances  03/23/2018 - LEFT L5 TFESI with 80-85% relief for about 3-4 weeks of just axial back pain.   09/15/2017 - Bilateral L5 TFESI with 50-75% relief of mixture of  low back and leg pain.   05/19/2017 - RIGHT L5 TFESI with 100% relief of Radicular symptoms for about 3 months  Dr. Cheryl  Injections in our clinic have included (exluding MBB and trigger point injections):  08/12/2016 - L5-S1 ILESI  07/01/2016 - Ultrasound guided right trochanteric bursa injection   05/20/2016 - Bilateral shoulder injections  05/13/2016 - Right L3 L4 L5/S1 RFA  02/19/2016 -  Left L3, L4, L5/S1 RFA  12/27/2012 - L5-S1 IL ESI  08/31/2012 - Left L5 TFESI  03/30/2012 - Left L4/5, L5/S1 facet inj  02/10/2012 - Left C6 C7 MB RFA  07/03/2011 - Left L3, L4, L5/S1 RFA  05/12/2011 - Left C4, C5, C6 RFA  Labs/Imaging Reviewed:  None    REVIEW OF SYSTEMS:  The review of systems documented by our clinic staff was provided by the patient. I did review the ROS and have no further detail to add.    PHYSICAL EXAMINATION:  BP (!) 147/98   Pulse 78   Temp 36.1 C (Temporal)   Ht 6' 4 (1.93 m)   Wt (!) 102.1 kg (225 lb) Comment: pt reported  SpO2 96%   BMI 27.39 kg/m    Vitals and nursing note reviewed.   Constitutional:       Appearance: Normal appearance.   Pulmonary:      Effort: Pulmonary effort is normal.   Musculoskeletal:      Comments:   -Small red rash noted around right lateral lower back  -Tenderness to palpation along L4/S1 midline, radiation of pain to upper back  -5/5 strength in bilateral lower extremities and feet in proximal and distal muscle groups  -Myofascial bands around paraspinus muscles particularly in right thoracic and lumbar region  -Positive lumbar facet loading bilaterally  Skin:     General: Skin is warm and dry.   Neurological:      Mental Status: He is alert and oriented to person, place, and time. Mental status is at baseline.   Psychiatric:         Mood and Affect: Mood normal.         Behavior: Behavior normal.      IMPRESSION:   Ronnie Mason is a 73 year old male with   Progressive left neck pain without radiation to left lateral upper armconsistent with cervical facet joint  syndrome, particularly at the bilateral C5-6, C6-7 levels. Responded well to CESI on 12/04/2022 but due to lack of radiculitis, not indicated at this time. Responded to MBB with Dr. Alena despite post-procedural complication and infection (see procedural note by Dr. Alena.)  Low back pain 2/2 facet arthropathy with improvement after L4-5, L5-S1 RFA and lumbosacral radiculopathy with improvement after caudal ESI. Symptoms currently worsening and likely includes components of each diagnosis. Additional components of  myofascial pain syndrome, central sensitization.  Right hip pain 2/2 severe OA.  Prior treatment failures including short duration of benefit for interlaminar lumbar epidural steroids and TFESI, MBB, memantine , Naltrexone , tizanidine  (sedation).  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 Particular emphasis on gait training.  Can plan for another epidural steroid injection in the lower back. Will place referral for the injection. Approach will depend on imaging - LESI versus caudal  Can consider another RFA pathway treatment with goal of delaying OR. Would likely plan bilateral  MBB L4-5, L5-S1.   If the above are not helpful, possible next step would be surgery.    We discussed our impressions and the following suggestions/options in detail and  provided him with information in the after visit summary.  Explained to Mr. Zappone that it would be okay to receive steroid injections from the pain clinic and  his podiatrist, Dr. Luke, as long as they spaced about two weeks apart.       Medication suggestions:   Stop Gabapentin  100 mg BID given redundant mechanism with pregabalin   Consider stopping Diclofenac  tablet 75 mg BID (or trial ibuprofen or naproxen )    Continue medications below:  Pregabalin  300mg  bid  Naltrexone  50 mg  Duloxetine  60mg  bid  Lidocaine  5% patch  Tylenol  PRN  Diclofenac  gel prn     Follow-up: For procedure

## 2023-12-03 ENCOUNTER — Encounter (HOSPITAL_BASED_OUTPATIENT_CLINIC_OR_DEPARTMENT_OTHER): Payer: Self-pay

## 2023-12-07 ENCOUNTER — Encounter (HOSPITAL_BASED_OUTPATIENT_CLINIC_OR_DEPARTMENT_OTHER): Payer: Self-pay

## 2023-12-10 ENCOUNTER — Other Ambulatory Visit (INDEPENDENT_AMBULATORY_CARE_PROVIDER_SITE_OTHER): Payer: Self-pay | Admitting: Family Medicine

## 2023-12-10 DIAGNOSIS — M792 Neuralgia and neuritis, unspecified: Secondary | ICD-10-CM

## 2023-12-11 MED ORDER — PREGABALIN 300 MG OR CAPS
300.0000 mg | ORAL_CAPSULE | Freq: Two times a day (BID) | ORAL | 0 refills | Status: DC
Start: 2023-12-11 — End: 2024-03-14

## 2023-12-11 NOTE — Telephone Encounter (Signed)
 rx refilled. Routed to clinical staff pool.

## 2023-12-13 NOTE — Telephone Encounter (Signed)
 Sent mychart message informing PT of approved refill. Postponing for patient read.

## 2023-12-14 ENCOUNTER — Ambulatory Visit (INDEPENDENT_AMBULATORY_CARE_PROVIDER_SITE_OTHER): Admitting: Podiatrist

## 2023-12-14 DIAGNOSIS — M25871 Other specified joint disorders, right ankle and foot: Secondary | ICD-10-CM

## 2023-12-14 DIAGNOSIS — G629 Polyneuropathy, unspecified: Secondary | ICD-10-CM

## 2023-12-14 DIAGNOSIS — M7671 Peroneal tendinitis, right leg: Secondary | ICD-10-CM

## 2023-12-14 NOTE — Progress Notes (Signed)
 Patient: Ronnie Mason   Patient DOB: June 25, 1951     DOS:  12/14/2023     Accompanied by:  patient was unaccompanied    Subjective:  (history is obtained by MA/RN and edited by Dr. Burdette Carolin)  Ronnie Mason is a 73 year old year old male who presents today for re-evaluation of neuropathy, bilateral and pain on sub 1st metatarsal and 5th metatarsal base.    The patient states of worsening. He reports right foot is worse and has been flared up the last 4 month  The current condition has existed for 4 year(s).    Patient describes the symptoms as numbness, pain with activity, pain with long standing, pain with pressure, stiffness, swelling, tender, and tingling.     The level the pain/severity of pain is 6/10 and 7/10.   The condition is worse when standing and walking.     Current treatments/recent studies include  gabapentin - bad reaction, cortisone injection, shoe - supportive, and Quikstride insert.     The response to the treatment is slight improvement.     Patient's activity limitation is limited in walking, limited in recreation, and limits activities of daily living.       Goals: evaluate current condition    PCP:  Claven Cumming, MD     PMH:   Active Ambulatory Problems     Diagnosis Date Noted    Syncope     Hypotension     Carotid Sinus Hypersensitivity     URI (upper respiratory infection)     Cervicalgia 03/11/2010    Hyperlipidemia 09/26/2012    Chronic pain 12/21/2012    Bee sting allergy 12/21/2012    Numbness and tingling of leg 12/21/2012    Chronic back pain 12/21/2012    B12 deficiency 12/21/2012    Chronic renal insufficiency, stage III (moderate) (HCC) 12/21/2012    Back pain, lumbosacral 02/15/2013    Lumbar radiculopathy, chronic 02/15/2013    Neck pain 02/15/2013    Fall 02/23/2013    Tremor 04/20/2013    Sensory neuropathy 01/18/2014    Cervical facet joint syndrome 05/28/2014    Idiopathic peripheral neuropathy 03/15/2015    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) 05/07/2015    History of  alcohol  dependence (HCC) 06/26/2015    Cerebral ventriculomegaly 10/09/2015    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    Essential hypertension 12/16/2015    Essential tremor 12/16/2015    Hx of spontan intraparenchymal intracran bleed assoc with hypertension 12/16/2015    Alcohol  abuse 12/16/2015    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    Balance problem 01/23/2016    Chronic pain of both shoulders 01/23/2016    Vitamin D  deficiency 02/13/2016    Difficulty in walking, not elsewhere classified 03/12/2016    Impaired mobility and ADLs 04/08/2016    History of traumatic brain injury 08/06/2016    Primary osteoarthritis of right hip 01/11/2017    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    Right lumbar radiculitis 05/05/2017    Greater trochanteric pain syndrome of right lower extremity 11/25/2017    Complex tear of medial meniscus of left knee as current injury 09/08/2017    Primary osteoarthritis of left knee 09/08/2017    Acute left-sided low back pain without sciatica 03/14/2018    Chronic left-sided low back pain without sciatica 06/21/2018    Enuresis 09/06/2018    Closed fracture of one rib of right side 01/26/2019    MRSA (methicillin resistant staph  aureus) culture positive 07/13/2019    Myofascial pain 10/20/2019    Macrocytosis without anaemia 02/12/2020    Psychological factors affecting medical condition 04/24/2021    Chronic bilateral low back pain without sciatica 04/29/2021    Mallet toe, right 04/23/2022    Neurogenic bladder 07/17/2022    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    Urge incontinence 07/17/2022    Cerebral microvascular disease 10/01/2022    Mixed incontinence 10/01/2022    Nasal bleeding 05/12/2023    Secondary hypertension 05/12/2023    Pain at surgical incision 09/15/2023    Ulcer of foot, limited to breakdown of skin, unspecified laterality (HCC) 10/04/2023    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      Resolved Ambulatory Problems     Diagnosis Date Noted    Lumbosacral radiculopathy at S1 03/22/2013    Intraparenchymal hemorrhage of brain (HCC) 01/06/2016    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) 01/15/2016    Pain of right hip joint 01/23/2016    Alcoholism in remission (HCC) 03/16/2019    Abscess of foot 12/02/2020    Cellulitis and abscess of foot 12/02/2020    Abscess 12/02/2020    Abscess of right foot 12/10/2020     Past Medical History:   Diagnosis Date    Allergic rhinitis due to allergen     Disc disorder of lumbosacral region     Hip injury     HYPERTENSION      Spondylosis of cervical region without myelopathy or radiculopathy 05/28/2014    Stroke [facility]) 2017    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[facility])         Review of patient's allergies indicates:  Allergies   Allergen Reactions    Adhesives Skin: Rash     Glue on lido patches    Bee Venom Skin: Hives, Skin: Itching and Swelling    Gabapentin  Other and GN:FAOZHY/QMVHQION     Flu like symptoms, diarrhea, body ache upset stomach    Other (See Comments) Skin: Hives    Methocarbamol  Other     Patient's wife reports cognitive difficulties ("goofy")        Medications:   Outpatient Medications Prior to Visit   Medication Sig Dispense Refill    Acetaminophen  500 MG Oral Tab Take 3 tablets (1,500 mg) by mouth 2 times a day.      Alpha-Lipoic Acid 300 MG tablet Take 300 mg by mouth 2 times a day.      amLODIPine  5 MG tablet Take 1 tablet (5 mg) by mouth 2 times a day. 180 tablet 1    atorvastatin  10 MG tablet Take 1 tablet (10 mg) by mouth daily. 90 tablet 1    BENFOTIAMINE OR Take 250 mg by mouth every morning.      betamethasone  dipropionate 0.05 % lotion Apply to scalp and ears topically 2 times a day as needed for itching. notify md of any signs of infection or skin thinning. 60 mL 1    Cholecalciferol (VITAMIN D3) 2000 units Oral Cap Take 1 capsule (2,000 Units) by mouth daily. For low level 1 capsule 1    clindamycin 1 % gel Apply 1 Application  topically  2 times a day as needed. Apply to back.      cyanocobalamin  (Vitamin B-12) 1000 MCG tablet Take 1 tablet (1,000 mcg) by mouth daily.      diclofenac  1 % gel Apply 4 g topically 4 times a day. Apply to 2 gram four times daily to neck,  trapezius muscle, and low back. Use a max of 32 grams a day. 300 g 4    diclofenac  sodium 75 MG EC tablet Take 1 tablet (75 mg) by mouth 2 times a day. Take with food 60 tablet 11    DULoxetine  60 MG DR capsule Take 1 capsule (60 mg) by mouth 2 times a day. 180 capsule 1    EPINEPHrine  0.3 MG/0.3ML auto-injector Inject one pen (0.3mg /mL) into the muscle for severe allergic reaction. Call 911. Use second pen if symptoms continue 4 each 2    gabapentin  100 MG capsule Take 1 capsule (100 mg) by mouth 2 times a day. 30 capsule 1    lidocaine  5 % patch Apply 1 patch onto the skin daily as needed. Apply to painful area for up to 12 hours in a 24 hour period. 30 patch 3    lisinopril  20 MG tablet TAKE 1 TABLET BY MOUTH TWICE DAILY 200 tablet 1    MAGNESIUM  OR Take 2 tablets by mouth at bedtime.      mirabegron  ER (Myrbetriq ) 50 MG 24 hr tablet Take 1 tablet (50 mg) by mouth daily. 100 tablet 3    multivitamin with minerals tablet Take 1 tablet by mouth daily.      naltrexone  50 MG tablet TAKE 1 TABLET BY MOUTH DAILY WITH FOOD 30 tablet 0    Omega-3 Fatty Acids (OMEGA 3 OR) Take 1 tablet by mouth every morning.      pregabalin  300 MG capsule TAKE 1 CAPSULE BY MOUTH TWICE DAILY 180 capsule 0    primidone  50 MG tablet Take 1 tablet (50 mg) by mouth 2 times a day. 180 tablet 4    Thiamine  HCl (VITAMIN B-1 OR) 100 mg.       Facility-Administered Medications Prior to Visit   Medication Dose Route Frequency Provider Last Rate Last Admin    sodium chloride  0.9 % infusion  50 mL/hr Intravenous Continuous Cassandria Clever, MBBS        triamcinolone  acetonide (Kenalog -40) injection 40 mg  40 mg Intralesional Once Bhatt, Mohit, DO            PSH:   Past Surgical History:   Procedure Laterality Date    L  meniciscus Left 07/2017    by Dr Steve El , re did left menisicus     PR ANES; COLONOSCOPY  2002    repeat in 3 years    PR ANES; COLONOSCOPY & POLYPECTOMY  11/18/2007    repeat in 3 years    PR CORRECTION HAMMERTOE Right 05/13/2022    Dr. Consuelo Denmark, DPM    PR HALLUX RIGIDUS W/CHEILECTOMY 1ST MP JT W/O IMPLT Right 10/21/2017    Dr. Clementeen Custard    PR UNLISTED PROCEDURE FEMUR/KNEE      PR UNLISTED PROCEDURE HANDS/FINGERS      PR UNLISTED PROCEDURE SPINE  2013    rfa to c3 to c 7     SURGICAL HX OTHER Right 12/03/2020    RIGHT foot WOUND INCISION AND DRAINAGE    TOE SURGERY Right 02/09/2019    toe surgery  Right 01/12/2019    Dr Consuelo Denmark         Family History:  family history includes Colon Cancer in his father; Heart (other) in his mother; Other Family Hx in an other family member; Suicide Attempt in his brother. There is no history of Cancer.    Social History:  Social History     Socioeconomic History  Marital status: Married     Spouse name: [name]    Number of children: 2    Years of education: Not on file    Highest education level: Not on file   Occupational History    Not on file   Tobacco Use    Smoking status: Never    Smokeless tobacco: Current     Types: Cassie Click     Last attempt to quit: 11/28/2021    Tobacco comments:     Sometimes chews, but is trying to quit. 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  Non-smoker, 14 or more drinks/week almost exclusively wine with dinner.      2024 still retired           Social research officer, government Insecurity: Not on Occupational hygienist Needs: Not on file   Intimate Partner Violence: Not on file   Housing Stability: Not on file        Physical Exam:   There were no vitals filed for this visit.     General: The patient is 73 year old year old male White  with constitutional symptom: appears age stated.    Psychiatric/mood: normal mood and affect.    Neurological: alert and oriented x3.    Vascular:   RIGHT:  Dorsalis pedis pulse is palpable.  Posterior tibial pulse is palpable. Capillary filling time note to be immediate on the distal hallux.      Neurologic:  RIGHT:   The sensation is diminished on the foot and ankle.   Motor coordination is intact with normal muscle tone.      Dermatological:   RIGHT:  Upon inspection, the skin note to have normal texture/tone/turgor.  There is no ulceration/open wound or dermatosis present.  Integument coloration is within normal range and temperature is within normal range.  The hair growth is normal.      Musculoskeletal:   RIGHT:  There is mild-moderate pain on palpation at the 5th metatarsal styloid process and tibial sesamoid.  There is mild  swelling noted over the fibular sesamoid.  There is no warmth to touch and ecchymosis of the affected area.       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  After discussing the options, patient elected to: Capsaicin cream(avoid rubbing the eye) - apply entire plantar feet since he note of improvement .when apply on the arch region.      Education and discussion took place regards to tibial sesamoiditis and peroneus brevis insertional strain/tendinitis, right.  We have explained to patient that many factors can  contribute to this condition such as biomechanical abnormality. We have discussed  multiple treatment options i.e.- continue current care, insert modification, orthotics, and injections.   The patient elected to try Quickstride insert and insert modification.         OTC insert MODIFICATION:  Quickstride insert modification was perform by Dr. Burdette Carolin which entails 1/8" valgus wedge on plastic portion only on the right.  .     Return appointment: follow up in 2-3 weeks    Alben Hue DPM, FACFAS  Reconstructive Ankle and Foot Surgeon  [facility]

## 2023-12-14 NOTE — Progress Notes (Signed)
 Ronnie Mason is a 73 year old year old male who presents today for re-evaluation of neuropathy, bilateral. .    The patient states of worsening. He reports right foot is worse and has been flared up the last 4 month  The current condition has existed for 4 year(s).    Patient describes the symptoms as numbness, pain with activity, pain with long standing, pain with pressure, stiffness, swelling, tender, and tingling.     The level the pain/severity of pain is 6/10 and 7/10.   The condition is worse when standing and walking.     Current treatments/recent studies include  gabapentin - bad reaction, cortisone injection, shoe - supportive, and Quikstride insert.     The response to the treatment is slight improvement.     Patient's activity limitation is limited in walking, limited in recreation, and limits activities of daily living.

## 2023-12-15 NOTE — Telephone Encounter (Signed)
 Pt read message    Nothing Further Needed    Closing Encounter

## 2024-01-05 ENCOUNTER — Telehealth (INDEPENDENT_AMBULATORY_CARE_PROVIDER_SITE_OTHER): Payer: Self-pay

## 2024-01-05 NOTE — Telephone Encounter (Signed)
 RETURN CALL: Voicemail - Detailed Message      SUBJECT:  Cancellation/Reschedule Request     APPOINTMENT DATE TO CANCEL: appointment on 6/5 at 1pm  REASON FOR CANCELLATION: Patient cannot make it  RESCHEDULE PREFERRED DATE/TIME: none at this time  ADDITIONAL INFORMATION: please cancel appointment per wife's request.

## 2024-01-05 NOTE — Telephone Encounter (Signed)
 LVM asking Pt to call back to reschedule. I did cancel the appointment for now.

## 2024-01-06 ENCOUNTER — Ambulatory Visit (INDEPENDENT_AMBULATORY_CARE_PROVIDER_SITE_OTHER)

## 2024-01-07 ENCOUNTER — Encounter (INDEPENDENT_AMBULATORY_CARE_PROVIDER_SITE_OTHER): Payer: Self-pay | Admitting: Family Medicine

## 2024-01-07 DIAGNOSIS — E785 Hyperlipidemia, unspecified: Secondary | ICD-10-CM

## 2024-01-11 ENCOUNTER — Telehealth (INDEPENDENT_AMBULATORY_CARE_PROVIDER_SITE_OTHER): Payer: Self-pay | Admitting: Family Medicine

## 2024-01-11 ENCOUNTER — Emergency Department
Admission: EM | Admit: 2024-01-11 | Discharge: 2024-01-11 | Disposition: A | Discharge status: Home / Self Care | Source: Ambulatory Visit

## 2024-01-11 ENCOUNTER — Emergency Department (HOSPITAL_COMMUNITY)

## 2024-01-11 ENCOUNTER — Other Ambulatory Visit: Payer: Self-pay

## 2024-01-11 ENCOUNTER — Emergency Department (EMERGENCY_DEPARTMENT_HOSPITAL)

## 2024-01-11 DIAGNOSIS — H4922 Sixth [abducent] nerve palsy, left eye: Secondary | ICD-10-CM

## 2024-01-11 DIAGNOSIS — R269 Unspecified abnormalities of gait and mobility: Secondary | ICD-10-CM

## 2024-01-11 DIAGNOSIS — F17220 Nicotine dependence, chewing tobacco, uncomplicated: Secondary | ICD-10-CM | POA: Insufficient documentation

## 2024-01-11 DIAGNOSIS — Z79899 Other long term (current) drug therapy: Secondary | ICD-10-CM | POA: Insufficient documentation

## 2024-01-11 DIAGNOSIS — R2689 Other abnormalities of gait and mobility: Secondary | ICD-10-CM

## 2024-01-11 DIAGNOSIS — R410 Disorientation, unspecified: Secondary | ICD-10-CM

## 2024-01-11 LAB — COMPREHENSIVE METABOLIC PANEL
ALT (GPT): 20 U/L (ref 10–48)
AST (GOT): 19 U/L (ref 9–38)
Albumin: 4.1 g/dL (ref 3.5–5.2)
Alkaline Phosphatase (Total): 68 U/L (ref 36–161)
Anion Gap: 6 (ref 4–12)
Bilirubin (Total): 0.3 mg/dL (ref 0.2–1.3)
Calcium: 9.1 mg/dL (ref 8.9–10.2)
Carbon Dioxide, Total: 29 meq/L (ref 22–32)
Chloride: 105 meq/L (ref 98–108)
Creatinine: 1.12 mg/dL (ref 0.51–1.18)
Glucose: 120 mg/dL (ref 62–125)
Potassium: 4.7 meq/L (ref 3.6–5.2)
Protein (Total): 6.6 g/dL (ref 6.0–8.2)
Sodium: 140 meq/L (ref 135–145)
Urea Nitrogen: 40 mg/dL — ABNORMAL HIGH (ref 8–21)
eGFR by CKD-EPI 2021: >60 mL/min/{1.73_m2} (ref >59)

## 2024-01-11 LAB — URINALYSIS WITH REFLEX CULTURE
Bacteria, URN: NONE SEEN
Bilirubin (Qual), URN: NEGATIVE
Epith Cells_Renal/Trans,URN: NEGATIVE /HPF
Epith Cells_Squamous, URN: NEGATIVE /LPF
Glucose Qual, URN: NEGATIVE
Ketones, URN: NEGATIVE
Leukocyte Esterase, URN: NEGATIVE
Nitrite, URN: NEGATIVE
Occult Blood, URN: NEGATIVE
RBC, URN: NEGATIVE /HPF
Specific Gravity, URN: 1.024 g/mL (ref 1.006–1.027)
WBC, URN: NEGATIVE /HPF

## 2024-01-11 LAB — CBC, DIFF
% Basophils: 1 %
% Eosinophils: 3 %
% Immature Granulocytes: 0 %
% Lymphocytes: 24 %
% Monocytes: 15 %
% Neutrophils: 57 %
% Nucleated RBC: 0 %
Absolute Eosinophil Count: 0.18 10*3/uL (ref 0.00–0.50)
Absolute Lymphocyte Count: 1.53 10*3/uL (ref 1.00–4.80)
Basophils: 0.06 10*3/uL (ref 0.00–0.20)
Hematocrit: 41 % (ref 38.0–50.0)
Hemoglobin: 13.6 g/dL (ref 13.0–18.0)
Immature Granulocytes: 0.02 10*3/uL (ref 0.00–0.05)
MCH: 34.3 pg — ABNORMAL HIGH (ref 27.3–33.6)
MCHC: 33.3 g/dL (ref 32.2–36.5)
MCV: 103 fL — ABNORMAL HIGH (ref 81–98)
Monocytes: 0.93 10*3/uL — ABNORMAL HIGH (ref 0.00–0.80)
Neutrophils: 3.69 10*3/uL (ref 1.80–7.00)
Nucleated RBC: 0 10*3/uL
Platelet Count: 204 10*3/uL (ref 150–400)
RBC: 3.96 10*6/uL — ABNORMAL LOW (ref 4.40–5.60)
RDW-CV: 13.2 % (ref 11.0–14.5)
WBC: 6.41 10*3/uL (ref 4.3–10.0)

## 2024-01-11 LAB — LAB ADD ON ORDER

## 2024-01-11 LAB — EKG 12 LEAD
Atrial Rate: 75 {beats}/min
P Axis: 9 degrees
P-R Interval: 160 ms
Q-T Interval: 376 ms
QRS Duration: 74 ms
QTC Calculation: 419 ms
R Axis: -3 degrees
T Axis: -2 degrees
Ventricular Rate: 75 {beats}/min

## 2024-01-11 LAB — TSH WITH REFLEXIVE FREE T4: Thyroid Stimulating Hormone: 1.211 u[IU]/mL (ref 0.400–5.000)

## 2024-01-11 LAB — GLUCOSE, FINGERSTICK POC: Glucose, Finger Stick POC: 129 mg/dL — ABNORMAL HIGH (ref 62–125)

## 2024-01-11 LAB — 1ST EXTRA BLUE TOP

## 2024-01-11 LAB — 1ST EXTRA GOLD TOP

## 2024-01-11 MED ORDER — GADOTERIDOL 279.3 MG/ML IV SOLN
10.0000 mmol | Freq: Once | INTRAVENOUS | Status: AC | PRN
Start: 2024-01-11 — End: 2024-01-11
  Administered 2024-01-11: 10 mmol via INTRAVENOUS

## 2024-01-11 MED ORDER — ATORVASTATIN CALCIUM 10 MG OR TABS: 10.0000 mg | ORAL_TABLET | Freq: Every day | ORAL | 1 refills | Status: DC

## 2024-01-11 NOTE — Telephone Encounter (Signed)
 Pt advised by front desk staff to get imaging done at [facility] or [facility]. Pt left [facility] for the hospital. Nothing Further Needed    Closing Encounter

## 2024-01-11 NOTE — Discharge Instructions (Signed)
-   return to emergency room for uncontrolled pain, uncontrolled bleeding, passing out, new weakness/numbness in a particular extremity, chest pain, respiratory distress, other concerning symptoms  - avoid driving until symptoms improve or you follow-up with your primary neurologist  - if symptoms do not improve in 1 week follow-up with your primary care doctor

## 2024-01-11 NOTE — Telephone Encounter (Signed)
 Pt's outside optometrist called and said pt had appt with them just now. They like to sending him to get urgent testing done, because they suspect patient is having double vision due to a left side palsy.     Pt needs urgent order for CBC and an MRI. Patient heading to [facility] currently, may try urgent care or lab depending on what we advise when he gets here. Routing to PCP, please lab order ASAP.

## 2024-01-11 NOTE — ED Triage Notes (Addendum)
 Pt ambulatory to triage for bilateral double vision onset 3 weeks ago. Worsening today while driving on freeway.    Denies headache.     Spouse reports seeing eye doctor today who referred to ED for MRI. Spouse reports ongoing balance issues and cognitive decline (confusion), worsening the last several days.     Hx flu     BG 129 in triage.

## 2024-01-11 NOTE — ED Notes (Signed)
 AVS provided to pt. DC education complete and all questions addressed. No IV in place. VSS. GCS 15. Ambulatory with steady gait.Pt refused DC vitals.      Vania Belvie BRAVO, RN  01/11/24 [phone]

## 2024-01-11 NOTE — ED Provider Notes (Signed)
 CHIEF COMPLAINT   Chief Complaint   Patient presents with    Eye Problem    Altered Mental Status            HISTORY OF PRESENT ILLNESS AND REVIEW OF SYSTEMS      72yoM PMH HTN, essential tremor, CKD, CVA/microvascular cognitive changes, prior IPH p/w worsening double vision. Patient reports he was driving today and had double vision while attempting to change lanes. Notes these symptoms have been present for ~2weeks but with worsening over the past 2-3days. Wife also reports several months of worsening gait (previously ambulatory with cane only outside the home, now requiring at home) as well as worsening cognitive issues over similar period of time. Seen in ophthalmology clinic today with normal retinal evaluation, IOP but with concern for L abducens nerve palsy so sent to ED for further evaluation. Denies focal weakness/numbness/tingling (beyond chronic lower extremity paraesthesias which as unchanged), dysuria, fevers/chills, chest pain, SOB, abd pain. Stopped gabapentin  several weeks prior, otherwise no new medications.    Review of systems as noted above in HPI otherwise other systems reviewed and found to be negative or noncontributory.          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    Idiopathic peripheral neuropathy 03/15/2015    Spondylosis of cervical region without myelopathy or radiculopathy 05/28/2014    Back pain, lumbosacral 02/15/2013    B12 deficiency 12/21/2012    Borderline low 5 /21/2014    Chronic renal insufficiency, stage III (moderate) (HCC) 12/21/2012     5/ 2013    Hyperlipidemia 09/26/2012    Cervicalgia 03/11/2010    Radiofrequency ablation of c3 to c 7    Allergic rhinitis due to allergen     Carotid Sinus Hypersensitivity     Chronic pain     chronic LUE pain had injection.    Disc disorder of lumbosacral region     Hip injury     HYPERTENSION      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[facility])        Past Surgical History:   Procedure Laterality Date    L meniciscus Left 07/2017    by Dr Johnnye , re did left menisicus     PR ANES; COLONOSCOPY  2002    repeat in 3 years    PR ANES; COLONOSCOPY & POLYPECTOMY  11/18/2007    repeat in 3 years    PR CORRECTION HAMMERTOE Right 05/13/2022    Dr. Koren Cramp, DPM    PR HALLUX RIGIDUS W/CHEILECTOMY 1ST MP JT W/O IMPLT Right 10/21/2017    Dr. Redell Rhein    PR UNLISTED PROCEDURE FEMUR/KNEE      PR UNLISTED PROCEDURE HANDS/FINGERS      PR UNLISTED PROCEDURE SPINE  2013    rfa to c3 to c 7     SURGICAL HX OTHER Right 12/03/2020    RIGHT foot WOUND INCISION AND DRAINAGE    TOE SURGERY Right 02/09/2019    toe surgery  Right 01/12/2019  Dr Koren Cramp           MEDICATIONS AND ALLERGIES     OUTPATIENT MEDICATIONS:   Current Outpatient Medications   Medication Instructions    acetaminophen  (TYLENOL ) 1,500 mg, 2 times daily    Alpha-Lipoic Acid 300 mg, 2 times daily    amLODIPine  (NORVASC ) 5 mg, Oral, 2 times daily    atorvastatin  (LIPITOR) 10 mg, Oral, Daily    BENFOTIAMINE OR 250 mg, Every morning    betamethasone  dipropionate 0.05 % lotion Apply to scalp and ears topically 2 times a day as needed for itching. notify md of any signs of infection or skin thinning.    clindamycin 1 % gel 2 times daily PRN    cyanocobalamin  (VITAMIN B-12) 1,000 mcg, Daily    diclofenac  (VOLTAREN ) 4 g, Topical, 4 times daily, Apply to 2 gram four times daily to neck, trapezius muscle, and low back. Use a max of 32 grams a day.    diclofenac  sodium (VOLTAREN ) 75 mg, Oral, 2 times daily, Take with food    DULoxetine  (CYMBALTA ) 60 mg, Oral, 2 times daily     EPINEPHrine  0.3 MG/0.3ML auto-injector Inject one pen (0.3mg /mL) into the muscle for severe allergic reaction. Call 911. Use second pen if symptoms continue    gabapentin  (NEURONTIN ) 100 mg, Oral, 2 times daily    lidocaine  5 % patch 1 patch, Transdermal, Daily PRN, Apply to painful area for up to 12 hours in a 24 hour period.    lisinopril  (PRINIVIL ; ZESTRIL ) 20 mg, Oral, 2 times daily    MAGNESIUM  OR 2 tablets, Nightly    mirabegron  ER (MYRBETRIQ ) 50 mg, Oral, Daily    multivitamin with minerals tablet 1 tablet, Daily    naltrexone  50 MG tablet TAKE 1 TABLET BY MOUTH DAILY WITH FOOD    Omega-3 Fatty Acids (OMEGA 3 OR) 1 tablet, Every morning    pregabalin  (LYRICA ) 300 mg, Oral, 2 times daily    primidone  (MYSOLINE ) 50 mg, Oral, 2 times daily    Thiamine  HCl (VITAMIN B-1 OR) 100 mg    Vitamin D3 2,000 units, Oral, Daily, For low level       ALLERGIES:   Adhesives, Bee venom, Gabapentin , Other (see comments), and Methocarbamol               SOCIAL HISTORY AND FAMILY HISTORY   Social History     Tobacco Use    Smoking status: Never    Smokeless tobacco: Current     Types: Chew     Last attempt to quit: 11/28/2021    Tobacco comments:     Sometimes chews, but is trying to quit. 11/28/21   Substance Use Topics    Alcohol  use: Yes     Alcohol /week: 6.0 - 24.0 standard drinks of alcohol      Types: 3 Glasses of wine, 3 - 21 Standard drinks or equivalent per week     Comment: goal to get down to 2 per day    Drug use: Yes     Frequency: 1.0 times per week     Types: Marijuana     Comment: CBD (Occassionally)       Family History       Problem (# of Occurrences) Relation (Name,Age of Onset)    Heart (other) (1) Mother: MI    Other Family Hx (1) Other: myelodysplasia    Colon Cancer (1) Father    Suicide Attempt (1) Brother: died of suicide  Negative family history of: Cancer                  PHYSICAL EXAM   ED VITALS:  Vitals (Arrival)      T: 36.2 C (01/11/24 1643)  BP: (!) 152/77 (01/11/24 1640)  HR: 85 (01/11/24  1640)  RR: 18 (01/11/24 1640)  SpO2: 98 % (01/11/24 1640)     Vitals (Most recent in last 24 hrs)   T: 36.2 C (01/11/24 1643)  BP: (!) 172/103 (01/11/24 1935)  HR: 67 (01/11/24 1935)  RR: 20 (01/11/24 1935)  SpO2: 97 % (01/11/24 1935) Room air  T range: Temp  Min: 36.2 C  Max: 36.2 C  Wt 225 lb (102.059 kg)     Ht 6' 4 (1.93 m)     Body mass index is 27.39 kg/m.       CONST: Alert  HEAD: Atraumatic  NECK: Supple  HEENT: EOMI, PERRLA  IOP OD/OS 4/6  CV: RRR  RESP: CTAB  ABD: Soft, NTND  MSK: No pitting edema bilaterally  SKIN: Warm  NEURO: Normal speech, Strength 5/5, sensation grossly intact (beyond as above paraesthesias in feet which are unchanged), CN II-XII intact bilaterally beyond very subtle weakness to L lateral gaze (with inducible double vision looking laterally more L >R), and FNF intact bilaterally, ambulatory without issues, mildly wider based gait          LABORATORY:   Labs Reviewed   CBC, DIFF - Abnormal       Result Value    WBC 6.41      RBC 3.96 (*)     Hemoglobin 13.6      Hematocrit 41      MCV 103 (*)     MCH 34.3 (*)     MCHC 33.3      Platelet Count 204      RDW-CV 13.2      % Neutrophils 57      % Lymphocytes 24      % Monocytes 15      % Eosinophils 3      % Basophils 1      % Immature Granulocytes 0      Neutrophils 3.69      Absolute Lymphocyte Count 1.53      Monocytes 0.93 (*)     Absolute Eosinophil Count 0.18      Basophils 0.06      Immature Granulocytes 0.02      Nucleated RBC 0.00      % Nucleated RBC 0     COMPREHENSIVE METABOLIC PANEL - Abnormal    Sodium 140      Potassium 4.7      Chloride 105      Carbon Dioxide, Total 29      Anion Gap 6      Glucose 120      Urea Nitrogen 40 (*)     Creatinine 1.12      Protein (Total) 6.6      Albumin 4.1      Bilirubin (Total) 0.3      Calcium  9.1      AST (GOT) 19      Alkaline Phosphatase (Total) 68      ALT (GPT) 20      eGFR by CKD-EPI 2021 >60     URINALYSIS WITH REFLEX CULTURE - Abnormal    Color, URN Yellow      Clarity, URN  Clear      Specific  Gravity, URN 1.024      pH, URN < or =5.0      Protein (Alb Semiquant), URN Trace (*)     Glucose Qual, URN Negative      Ketones, URN Negative      Bilirubin (Qual), URN Negative      Occult Blood, URN Negative      Nitrite, URN Negative      Leukocyte Esterase, URN Negative      Urobilinogen, URN 0.1-1.9      Comments for Macroscopic, URN None      Collection Method Urine      WBC, URN 0-5(NEG)      RBC, URN 0-2(NEG)      Bacteria, URN Not Seen      Epith Cells_Squamous, URN 0-5(NEG)      Epith Cells_Renal/Trans,URN <3(NEG)      Comments For Microscopic, URN None      Mucus, URN Present (*)     1st Extra Urine [employer] Additional collection tube     GLUCOSE, FINGERSTICK POC - Abnormal    Glucose, Finger Stick POC 129 (*)    LAB ADD ON ORDER    Lab Test Requested TSH with reflex T4      Specimen Type/Description Blood      Sample To Use Most recent      Test Request Status Order Processed     TSH WITH REFLEXIVE FREE T4    Thyroid  Stimulating Hormone 1.211           IMAGING:     ED Wet Read -   MRI Brain Tumor/Metastasis   Preliminary Result      1.  No acute intracranial abnormality.    2.  Compared to 08/22/2022 and earlier exams, similar moderate diffuse ventriculomegaly.   3.  Mild intracranial volume loss with moderate degree of white matter findings typical for chronic microvascular ischemic change, similar to prior.       --------------------------------------------------------------   This is a preliminary report dictated by Venetia Dose MD, PGY-6 Neuroradiology Fellow      Unless a comment appears below, this exam has not been reviewed by an attending radiologist.      --------------------------------------------------------------         CT Head wo Contrast   Final Result   1. No acute intracranial abnormality.      2. Similar to minimally increased moderate diffuse cerebral volume loss similar to January 31, 2023. 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           Radiology Final Result -   No image results found.              EKG DOCUMENTATION                 SUICIDE RISK EVALUATION             SEPSIS               ED COURSE/MEDICAL DECISION MAKING      72yoM PMH HTN, essential tremor, CKD, CVA/microvascular cognitive changes, prior IPH p/w worsening double vision, very subtle L eye lateral gaze palsy and other minor more indolent cognitive changes with otherwise benign neuro exam. Considered broad differential both ocular and central nervous. No leukocytosis, preceding infectious symptoms, eye pain or swelling, benign ocular exam with ophthalmologist immediately prior (and benign exam in ED) makes primary ocular pathology (glaucoma, exophthalmos/thyroid  associated disease, orbital/preseptal cellulitis, retinal/vitreous detachment, inflammatory or infectious etiology of  the orbit suggest as uveitis or syphilis). MRI of orbits also able to assess for abnormalities in the musculature (which would suggest other inflammatory processes-  idiopathic orbital inflammation, other autoimmune process. Also considered other primary neurological etiology- stroke, mass (MRI benign). Considered recrudescence bu again no infectious symptoms, UA neg. 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 Consulted neurology who do not feel this requires acute intervention and already established with [facility] specialist. Will d/c home with return precautions.     ED Course as of 01/12/24 0522   Tue Jan 11, 2024   2221 MRI Brain Tumor/Metastasis  MRI without acute abnormality. Still with minor abducens nerve palsy. Discussed with neurology who do not suggest any additional interventions but follow-up with NPH specialist.  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  Discharge Medication List as of 01/11/2024 10:54 PM        START taking these medications    Details   atorvastatin  10 MG tablet Take 1 tablet (10 mg) by mouth daily.Disp-90 tablet, R-1, Normal                Disposition: Discharge        CRITICAL CARE/ADDITIONAL INFORMATION REVIEWED ATTENDING ONLY   Critical Care - No Critical Care          Trudy Cree Lucie Crank, MD  01/13/24 2339

## 2024-01-11 NOTE — Telephone Encounter (Signed)
 Pt requested Refill Rx.     Pt's last visit was on 10/04/23 for AWV.    Order pended and routing to PCP

## 2024-01-12 ENCOUNTER — Telehealth (HOSPITAL_BASED_OUTPATIENT_CLINIC_OR_DEPARTMENT_OTHER): Payer: Self-pay | Admitting: Neurology

## 2024-01-12 ENCOUNTER — Telehealth (HOSPITAL_BASED_OUTPATIENT_CLINIC_OR_DEPARTMENT_OTHER): Payer: Self-pay

## 2024-01-12 ENCOUNTER — Other Ambulatory Visit (INDEPENDENT_AMBULATORY_CARE_PROVIDER_SITE_OTHER): Payer: Self-pay | Admitting: Family Medicine

## 2024-01-12 DIAGNOSIS — L309 Dermatitis, unspecified: Secondary | ICD-10-CM

## 2024-01-12 NOTE — Telephone Encounter (Signed)
 RETURN CALL: Voicemail - Detailed Message      SUBJECT:  Appointment Request     REASON FOR VISIT: ER follow up. [facility].  PREFERRED DATE/TIME: next available.  REASON UNABLE TO APPOINT: Per SM to send TE for this visit type.

## 2024-01-12 NOTE — Telephone Encounter (Signed)
 Situation/Background: Pt is scheduled for a Lumbar ESI with Dr. Gwinda for 01/14/24.  Wife called and states Pt had ED visit yesterday for worsening double vision which he has had for 2 weeks and 1 week ago N/V.  [facility] did labs and imaging and he was discharged to home.  Vision improving today. Wife states they did not find anything remarkable causing visual symptoms. Pt will f/u with PCP in a couple of weeks.    Assessment: New onset of double vision with improvement today. No fever or N/V.    Recommendation/Follow up: Routing to Dr. Gwinda to see if ok to move forward with procedure or if patient should reschedule. Please advise.

## 2024-01-14 ENCOUNTER — Ambulatory Visit: Attending: Anesthesiology | Admitting: Anesthesiology

## 2024-01-14 VITALS — BP 110/70 | HR 75 | Temp 97.6°F | Resp 15

## 2024-01-14 DIAGNOSIS — M5417 Radiculopathy, lumbosacral region: Secondary | ICD-10-CM | POA: Insufficient documentation

## 2024-01-14 MED ORDER — DEXAMETHASONE SODIUM PHOSPHATE 10 MG/ML IJ SOLN
10.0000 mg | Freq: Once | INTRAMUSCULAR | Status: AC
Start: 2024-01-14 — End: 2024-01-14
  Administered 2024-01-14: 10 mg

## 2024-01-14 MED ORDER — IOHEXOL 240 MG/ML IJ SOLN
1.0000 mL | Freq: Once | INTRAMUSCULAR | Status: AC
Start: 2024-01-14 — End: 2024-01-14
  Administered 2024-01-14: 1 mL via EPIDURAL

## 2024-01-14 MED ORDER — DEXAMETHASONE SOD PHOSPHATE PF 10 MG/ML IJ SOLN
INTRAMUSCULAR | Status: AC
Start: 2024-01-14 — End: 2024-01-14
  Filled 2024-01-14: qty 1

## 2024-01-14 NOTE — Progress Notes (Signed)
 PROCEDURE NOTE       Lumbosacral Interlaminar Epidural Steroid Injection    PROCEDURE  Interlaminar epidural steroid injection L5-1    PRE-PROCEDURE DIAGNOSIS  (M54.17) Lumbosacral radiculopathy  (primary encounter diagnosis)    POST-PROCEDURE DIAGNOSIS  (M54.17) Lumbosacral radiculopathy  (primary encounter diagnosis)      ATTENDING PHYSICIAN  Suzzette Barrette, MD    FELLOW / RESIDENT  Dr. Lennie (observed)    SEDATION  No    NPO STATUS  No    INTRAVENOUS LINE  No    OPERATIVE PROCEDURE  Prior to the procedure a final verification was performed.   Ronnie Mason  was brought to the procedure room and placed on the exam table in a comfortable prone position. The sterile field was prepped in a sterile fashion with Chloroprep and sterile drapes were placed.   The target interlaminar space was visualized by anteroposterior fluoroscopic image. Local anesthesia was provided by infiltration of  1% lidocaine . An 18-gauge 3.5 inch Touhy needle was placed below the superior laminar edge. Using fluoroscopic guidance, the needle was advanced into the intralaminar window under multiplanar fluoroscopic control to prevent excessively deep insertion of the needle.  After engagement of the ligamentum flavum, the loss of resistance to saline technique was used to identify the epidural space. Attempted aspiration yielded no blood or cerebrospinal fluid. Subsequently, Omnipaque -240 was injected. Fluoroscopy showed the typical pattern of the epidural space and no vascular uptake. Lateral view confirmed proper epidural spread.  Injectate:  A total of dexamethasone  10 mg (1 ml) and 2 ml preservative free saline were injected into the epidural space. The needle was then withdrawn uneventfully.    POST-PROCEDURE  Ronnie Mason  was transported to the recovery room for observation. Where he  made an uneventful recovery.  Ronnie Mason was given detailed discharge instructions.     COMPLICATIONS  None.     PLAN  Pain diary.  Subsequent care will  depend on the results of today's procedure.         ATTENDING STATEMENT  I personally performed the procedure

## 2024-01-14 NOTE — Patient Instructions (Signed)
 [facility]  Post-Procedure Discharge Instructions      After the procedure:  Immediate and complete pain relief is rare  Numbness and/or weakness in the area of your body supplied by the injected nerve; these symptoms should resolve but may last up to several hours  Some soreness and bruising at the injection site(s)    Activities:  If you have any weakness or numbness caused by the injection, DO NOT DRIVE or operate machinery and limit other activity until sensation returns to normal.  You may resume regular exercises/activities as tolerated.  If you received sedation, DO NOT DRIVE or operate machinery for 24 hours.    Medications:  If you stopped taking any blood thinning medications such as Coumadin or Plavix, you may resume these tomorrow unless specified differently by the prescribing physician.    Site care:  You may remove the band-aid after 6 hours.  You may shower today. No swimming, tub baths or hot tubs for 24 hours following your procedure.  For the first 48 hours, apply ice packs to the injection site for 15-20 minutes hourly as needed for comfort.  Wrap a light towel or cloth around ice packs and heating pad to protect the skin.  After 48 hours, use a warm heating pad to the injection site for 15-20 minutes hourly as needed for comfort.    If you received steroids today:  Steroid medications may cause facial flushing, occasional low-grade fevers, hiccups, insomnia, headaches, water retention, increased appetite, increased heart rate, and abdominal cramping or bloating. These side effects occur in only about 5 percent of patients and commonly disappear within one to three days after the injection.  If you are diabetic check your blood sugar more frequently than usual as you may develop an increase in blood sugar for the next 10-14 days. Contact your diabetes physician if this occurs.    Call us  if you develop any of the following symptoms in the next 7 days:  Fever above 100 degrees  F     Any unusual increase in your level of pain  Swelling, bleeding, redness, or increased tenderness at the procedure or IV site  Headache not relieved by Tylenol  (if you had an epidural steroid injection)  .    Contact us :  Anytime, 24 hours/day, 7 days/week at [phone], option 2, option 2.          [facility]  Patient Self-Administered 6 Hour Pain Diary  Ronnie Mason   Procedure Date: 01/14/24  Procedure Name: Lumbar ILESI or possible Caudal ESI   Procedure Attending: [name]    1.  For accuracy keep this diary with you during the 6 hour period so you can write down your pain scores as you go, at the time noted on your diary.  During the 6 hours, please document your pain scores while doing activities that provoke your pain as well as when you are not doing those activities.      2.  Within 24 hours of completing this pain score diary, please send us  your pain score diary either by:  1) Scanning into MyChart  2)  Faxing to [phone] or 3)  Calling the pain score voicemail line at [phone].  Leave the spelling of your first and last name,  U#, date of birth, date of service and the 20 numbers you wrote below, including pre-procedure, immediately post procedure and % pain relief.  After that, you may leave any comments regarding your  post block experience that may be helpful (e.g. was able to walk, much worse, had to take medications).      Use this scale to rate your pain  0 1 2 3 4 5 6 7 8 9  10  No pain                                          worst pain     TIME PAIN SCORE WITH PAINFUL ACTIVITY PAIN SCORE WITHOUT PAINFUL ACTIVITY   Before injections:       Immediately after injections:       30 minutes after injections:       1 hour after injections:       2 hours after injections:       3 hours after injections:       4 hours after injections:       5 hours after injections:       6 hours after injections:       When I had the lowest pain score, my % (percentage) pain relief:                         %                 %       IT IS ESSENTIAL YOU CALL IN YOUR PAIN DIARY RESULTS BEFORE ANY FURTHER PROCEDURE(S) CAN BE SCHEDULED.         [facility]  Patient Self-Administered Pain Diary  1 month 02/13/24        The procedure you just had was done in hopes of providing long term pain relief. Depending on the type of procedure you had, you will need to answer the questions below 1 month after your procedure.  Accurate completion and timely reporting of your pain diary(s) enables your Pain MD to review the results and make further treatment recommendations.    1 month after your procedure, write your answers to the 4 questions below and send your pain score diary to us  either by:      1)  Sending a picture via MyChart   2)  Faxing to [phone]  3)  Calling the pain score voicemail line at [phone].  Leave the spelling of your first and last name, U#, date of birth and date of procedure and the answers to all of the 4 questions below.             Use this scale to rate your pain  0 1 2 3 4 5 6 7 8 9  10  No pain                      worst pain      Ronnie Mason   Procedure Date: 01/14/24  Procedure Name: Lumbar ILESI or possible Caudal ESI   Procedure Attending: [name]    1.  Pain score immediately before procedure: __________    2. 1 month after your procedure, do you feel better?  YES/NO    3.  If you answered YES to the above, by what percentage__________% (0-100%) has your pain been relieved.    4.  Pain Score now: _________ (at time of telephone call)         [facility]  Patient Self-Administered  Pain Diary  3 months 04/15/24        The procedure you just had was done in hopes of providing long term pain relief. Depending on the type of procedure you had, you will need to answer the questions below 3 months after your procedure.  Accurate completion and timely reporting of your pain diary(s) enables your Pain MD to review the results and make further treatment  recommendations.    3 months after your procedure, write your answers to the 4 questions below and send your pain score diary to us  either by:      1)  Sending a picture via MyChart   2)  Faxing to [phone]  3)  Calling the pain score voicemail line at [phone].  Leave the spelling of your first and last name, U#, date of birth and date of procedure and the answers to all of the 4 questions below.             Use this scale to rate your pain  0 1 2 3 4 5 6 7 8 9  10  No pain                      worst pain      Lawarence Meek   Procedure Date: 01/14/24  Procedure Name: Lumbar ILESI or possible Caudal ESI   Procedure Attending: [name]    1.  Pain score immediately before procedure: __________    2. 3 months after your procedure, do you feel better?  YES/NO    3.  If you answered YES to the above, by what percentage__________% (0-100%) has your pain been relieved.    4.  Pain Score now: _________ (at time of telephone call)

## 2024-01-14 NOTE — Progress Notes (Signed)
 Pain score prior to procedure:  3/10  Pain score after procedure:  2/10    [name] met discharge criteria:  A/OX4/baseline, VSS/returned to baseline. Mobility returned to baseline prior to discharge. Procedure site clean dry and intact. Written and verbal discharge instructions, including emergency contact phone numbers, reviewed with [name]. Verbal understanding obtained from him. [name] dressed self without nursing assistance. He was discharged in stable condition from procedure area with steady gait. to home with all belongings and with ride/responsible person.

## 2024-01-14 NOTE — Progress Notes (Signed)
 [name] identified by name and DOB.   He confirmed he is having a Lumbar ILESI or possible Caudal ESI   He was admitted to procedure area with steady gait.  He has not fallen in the last 6 months.  Fall prevention interventions: Patient provided with non-skid stockings  He denies taking any blood thinners.  He has held his medications per provider instructions.  He denies having a bleeding disorder.  He has not been on antibiotics for the past two weeks.   He denies a history of fainting during medical procedures.   He has not been NPO.  He undressed self without nursing assistance.  Is the patient receiving a steroid injection today? Yes; Has the patient received the COVID-19 vaccine (either brand) within the past 2 weeks? no OR does the patient plan to receive the COVID-19 vaccine (either brand) within the next 2 weeks? no   PIV not indicated for procedure.  I verbally reviewed written discharge instructions and pain diary with him.   New consent placed on chart  Confirmed ride home with lindy at bedside  He has a pre-procedure pain score of 3/10 lower back

## 2024-01-15 MED ORDER — BETAMETHASONE DIPROPIONATE 0.05 % EX LOTN
TOPICAL_LOTION | CUTANEOUS | 8 refills | Status: AC
Start: 2024-01-15 — End: ?

## 2024-01-19 ENCOUNTER — Encounter (INDEPENDENT_AMBULATORY_CARE_PROVIDER_SITE_OTHER): Admitting: Podiatrist

## 2024-01-19 NOTE — Telephone Encounter (Signed)
 Patient spouse, Ortencia,  is frustrated that she has been calling for a week for a follow up appointment for her husband. He is an established patient of Dr. Trudy and needs an ER follow up visit. Please call [name] to schedule.

## 2024-01-19 NOTE — Telephone Encounter (Signed)
 Spouse calling back to schedule. CCR unable to appoint per SM. Patient was seen in the ED 01/11/24 at [facility]. Please review and assist.

## 2024-01-24 ENCOUNTER — Ambulatory Visit (INDEPENDENT_AMBULATORY_CARE_PROVIDER_SITE_OTHER): Admitting: Family Medicine

## 2024-01-24 ENCOUNTER — Telehealth (INDEPENDENT_AMBULATORY_CARE_PROVIDER_SITE_OTHER): Payer: Self-pay | Admitting: Family Medicine

## 2024-01-24 DIAGNOSIS — Q043 Other reduction deformities of brain: Secondary | ICD-10-CM

## 2024-01-24 DIAGNOSIS — G910 Communicating hydrocephalus: Secondary | ICD-10-CM

## 2024-01-24 DIAGNOSIS — G9389 Other specified disorders of brain: Secondary | ICD-10-CM

## 2024-01-24 DIAGNOSIS — N3941 Urge incontinence: Secondary | ICD-10-CM

## 2024-01-24 DIAGNOSIS — F0150 Vascular dementia without behavioral disturbance: Secondary | ICD-10-CM

## 2024-01-24 DIAGNOSIS — G379 Demyelinating disease of central nervous system, unspecified: Secondary | ICD-10-CM

## 2024-01-24 DIAGNOSIS — F1020 Alcohol dependence, uncomplicated: Secondary | ICD-10-CM

## 2024-01-24 MED ORDER — MIRABEGRON ER 50 MG OR TB24
50.0000 mg | EXTENDED_RELEASE_TABLET | Freq: Every day | ORAL | 3 refills | Status: DC
Start: 2024-01-24 — End: 2024-05-30

## 2024-01-24 MED ORDER — NALTREXONE HCL 50 MG OR TABS
50.0000 mg | ORAL_TABLET | Freq: Every day | ORAL | 2 refills | Status: DC
Start: 2024-01-24 — End: 2024-04-23

## 2024-01-24 NOTE — Progress Notes (Signed)
 HPI: Ronnie Mason  is a 73 year old male who presents for:    1. ER f/u:  Was seen in ER 01/11/24 for double vision x 2-3 weeks and L eye lateral gaze palsy.   See ER documentation for details.   Since d/c home reports double vision resolved.  He is back to prior baseline.   D/c'ed home with plan for follow-up with Neurology, but has not yet been able to schedule visit.    Prior to ER visit had vomiting and diarrhea x 4 days.   Believes he may have been dehydrated the day of ER visit.   Has been drinking alcohol , ~20 drinks a week.   Was counseled by Neurologist on EtOH cessation.   Was previously on Naltrexone  but has not been taking it recently.   Denies other symptoms or concerns today.     Problem list and medications reviewed and updated today.    PE:  BP (P) 130/86   Pulse (P) 79   Temp (P) 36.8 C (Temporal)   Resp (P) 18   Wt (!) (P) 105.2 kg (232 lb)   SpO2 (P) 95%   BMI (P) 28.24 kg/m   General: no acute distress, pleasant  Head: Normocephalic. No masses, lesions, or abnormalities  Eyes: Conjunctivae/corneas clear, PERRL, EOM's intact  Oropharynx: normal, no lesions or uvular deviation.  Neck: supple. No adenopathy or thyromegaly.  Lungs: clear to auscultation b/l.  Heart: normal rate, regular rhythm and no murmurs, rubs, or gallops.  Ext: Normal, without deformities or edema.   Neuro: Grossly normal to observation, gait normal.  Skin: Skin color normal. No rashes or concerning lesions  Psych: normal thought process, no depressed mood or SI.    A/P: 73 year old male who presents with:    Cerebral ventriculomegaly  Demyelinating disease of central nervous system, unspecified (HCC)  Other reduction deformities of brain (HCC)  Communicating hydrocephalus (HCC)  Vascular dementia without behavioral disturbance, psychotic disturbance, mood disturbance, or anxiety, unspecified dementia severity [facility])  Neurology f/u discussed.   Return precautions discussed.   - CBC, Future  - COMP Panel,  Future    Alcohol  use disorder, severe, dependence (HCC)  EtOH cessation encouraged. Discussed alcohol  free household with partner.   - naltrexone  50 MG tablet; Take 1 tablet (50 mg) by mouth daily. Take with food.  Dispense: 30 tablet; Refill: 2      Follow up 1-2 months, or if symptoms worsen or progress.      ER documentation reviewed and discussed with patient.

## 2024-01-24 NOTE — Telephone Encounter (Signed)
 PA Request Received  Does this medication appear on the patient's med list? Yes  Request came from a Fax from Same pharmacy as listed on current Rx    Requested [facility] Workflow Exemptions: Approval notification    I have no additional information for this PA request.

## 2024-01-25 LAB — CBC (HEMOGRAM)
Hematocrit: 44 % (ref 38.0–50.0)
Hemoglobin: 14.2 g/dL (ref 13.0–18.0)
MCH: 33.5 pg (ref 27.3–33.6)
MCHC: 32.6 g/dL (ref 32.2–36.5)
MCV: 103 fL — ABNORMAL HIGH (ref 81–98)
Platelet Count: 224 10*3/uL (ref 150–400)
RBC: 4.24 10*6/uL — ABNORMAL LOW (ref 4.40–5.60)
RDW-CV: 13.1 % (ref 11.0–14.5)
WBC: 6.22 10*3/uL (ref 4.3–10.0)

## 2024-01-25 LAB — COMPREHENSIVE METABOLIC PANEL
ALT (GPT): 23 U/L (ref 10–48)
AST (GOT): 23 U/L (ref 9–38)
Albumin: 4.3 g/dL (ref 3.5–5.2)
Alkaline Phosphatase (Total): 81 U/L (ref 36–161)
Anion Gap: 9 (ref 4–12)
Bilirubin (Total): 0.4 mg/dL (ref 0.2–1.3)
Calcium: 8.9 mg/dL (ref 8.9–10.2)
Carbon Dioxide, Total: 30 meq/L (ref 22–32)
Chloride: 102 meq/L (ref 98–108)
Creatinine: 0.81 mg/dL (ref 0.51–1.18)
Glucose: 100 mg/dL (ref 62–125)
Potassium: 4.6 meq/L (ref 3.6–5.2)
Protein (Total): 6.7 g/dL (ref 6.0–8.2)
Sodium: 141 meq/L (ref 135–145)
Urea Nitrogen: 24 mg/dL — ABNORMAL HIGH (ref 8–21)
eGFR by CKD-EPI 2021: >60 mL/min/{1.73_m2} (ref >59)

## 2024-01-26 NOTE — Result Encounter Note (Signed)
 See eCare

## 2024-01-31 ENCOUNTER — Encounter (HOSPITAL_COMMUNITY): Payer: Self-pay

## 2024-01-31 ENCOUNTER — Ambulatory Visit (INDEPENDENT_AMBULATORY_CARE_PROVIDER_SITE_OTHER)

## 2024-01-31 NOTE — Progress Notes (Signed)
 Ronnie Mason presents today for an adjustment to his Quikstrides. His right side needed  little more valgus, so I added 1/8 Korex to the existing 1/8 Korex.     He will return for another adjustment or for me to make another pair of Quikstrides with modifications.    No Charge visit. Less than 10 minutes with patient. No Material charge.

## 2024-02-15 ENCOUNTER — Ambulatory Visit (INDEPENDENT_AMBULATORY_CARE_PROVIDER_SITE_OTHER): Admitting: Podiatrist

## 2024-02-15 ENCOUNTER — Other Ambulatory Visit (INDEPENDENT_AMBULATORY_CARE_PROVIDER_SITE_OTHER): Payer: Self-pay | Admitting: Family Medicine

## 2024-02-15 DIAGNOSIS — M21622 Bunionette of left foot: Secondary | ICD-10-CM

## 2024-02-15 DIAGNOSIS — M25871 Other specified joint disorders, right ankle and foot: Secondary | ICD-10-CM

## 2024-02-15 DIAGNOSIS — M21621 Bunionette of right foot: Secondary | ICD-10-CM

## 2024-02-15 DIAGNOSIS — I10 Essential (primary) hypertension: Secondary | ICD-10-CM

## 2024-02-15 DIAGNOSIS — G629 Polyneuropathy, unspecified: Secondary | ICD-10-CM

## 2024-02-15 NOTE — Progress Notes (Signed)
 Ronnie Mason is a 73 year old year old male who presents today for reevaluation of tibial sesamoiditis and peroneus brevis insertional strain/tendinitis, right.   neuropathy, bilateral.   The patient states of worsening  The current condition has existed for 4 year(s).    Patient describes the symptoms as numbness, pain with activity, pain with long standing, pain with pressure, tender, and tingling.     The level the pain/severity of pain is 5/10.   The condition is worse when walking.     Current treatments/recent studies include cortisone injection, shoe - supportive, and Quikstride insert.     The response to the treatment is temporary relief.     Patient's activity limitation is limited in walking and limited in recreation.    Goals: evaluate current condition.

## 2024-02-15 NOTE — Progress Notes (Signed)
 Patient: Ronnie Mason   Patient DOB: 07-Sep-1950     DOS:  02/15/2024     Accompanied by:  patient was unaccompanied    Subjective:  (history is obtained by MA/RN and edited by Dr. Luke)  Ronnie Mason is a 73 year old year old male who presents today for reevaluation of tibial sesamoiditis and peroneus brevis insertional strain/tendinitis, right.   neuropathy, bilateral.   The patient states of worsening  The current condition has existed for 4 year(s).    Patient describes the symptoms as numbness, pain with activity, pain with long standing, pain with pressure, tender, and tingling.     The level the pain/severity of pain is 5/10.   The condition is worse when walking.     Current treatments/recent studies include cortisone injection, shoe - supportive, and Quikstride insert.     The response to the treatment is temporary relief.     Patient's activity limitation is limited in walking and limited in recreation.    Goals: evaluate current condition.    PCP:  Tonette Stephannie RAMAN, MD     PMH:   Active Ambulatory Problems     Diagnosis Date Noted    Syncope     Hypotension     Carotid Sinus Hypersensitivity     URI (upper respiratory infection)     Cervicalgia 03/11/2010    Hyperlipidemia 09/26/2012    Chronic pain 12/21/2012    Bee sting allergy 12/21/2012    Numbness and tingling of leg 12/21/2012    Chronic back pain 12/21/2012    B12 deficiency 12/21/2012    Chronic renal insufficiency, stage III (moderate) (HCC) 12/21/2012    Back pain, lumbosacral 02/15/2013    Lumbar radiculopathy, chronic 02/15/2013    Neck pain 02/15/2013    Fall 02/23/2013    Tremor 04/20/2013    Sensory neuropathy 01/18/2014    Cervical facet joint syndrome 05/28/2014    Idiopathic peripheral neuropathy 03/15/2015    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) 05/07/2015    History of alcohol  dependence (HCC) 06/26/2015    Cerebral ventriculomegaly 10/09/2015    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    Essential hypertension 12/16/2015     Essential tremor 12/16/2015    Hx of spontan intraparenchymal intracran bleed assoc with hypertension 12/16/2015    Alcohol  abuse 12/16/2015    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    Balance problem 01/23/2016    Chronic pain of both shoulders 01/23/2016    Vitamin D  deficiency 02/13/2016    Difficulty in walking, not elsewhere classified 03/12/2016    Impaired mobility and ADLs 04/08/2016    History of traumatic brain injury 08/06/2016    Primary osteoarthritis of right hip 01/11/2017    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    Right lumbar radiculitis 05/05/2017    Greater trochanteric pain syndrome of right lower extremity 11/25/2017    Complex tear of medial meniscus of left knee as current injury 09/08/2017    Primary osteoarthritis of left knee 09/08/2017    Acute left-sided low back pain without sciatica 03/14/2018    Chronic left-sided low back pain without sciatica 06/21/2018    Enuresis 09/06/2018    Closed fracture of one rib of right side 01/26/2019    MRSA (methicillin resistant staph aureus) culture positive 07/13/2019    Myofascial pain 10/20/2019    Macrocytosis without anaemia 02/12/2020    Psychological factors affecting medical condition 04/24/2021    Chronic bilateral low back pain  without sciatica 04/29/2021    Mallet toe, right 04/23/2022    Neurogenic bladder 07/17/2022    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    Urge incontinence 07/17/2022    Cerebral microvascular disease 10/01/2022    Mixed incontinence 10/01/2022    Nasal bleeding 05/12/2023    Secondary hypertension 05/12/2023    Pain at surgical incision 09/15/2023    Ulcer of foot, limited to breakdown of skin, unspecified laterality (HCC) 10/04/2023    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     Resolved Ambulatory Problems     Diagnosis Date Noted    Lumbosacral radiculopathy at S1 03/22/2013    Intraparenchymal hemorrhage of brain (HCC) 01/06/2016     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) 01/15/2016    Pain of right hip joint 01/23/2016    Alcoholism in remission (HCC) 03/16/2019    Abscess of foot 12/02/2020    Cellulitis and abscess of foot 12/02/2020    Abscess 12/02/2020    Abscess of right foot 12/10/2020     Past Medical History:   Diagnosis Date    Allergic rhinitis due to allergen     Disc disorder of lumbosacral region     Hip injury     HYPERTENSION      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 myelopathy or radiculopathy 05/28/2014    Stroke [facility]) 2017    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[facility])         Review of patient's allergies indicates:  Allergies   Allergen Reactions    Adhesives Skin: Rash     Glue on lido patches    Bee Venom Skin: Hives, Skin: Itching and Swelling    Gabapentin  Other and HP:[id number]/[id number]     Flu like symptoms, diarrhea, body ache upset stomach    Other (See Comments) Skin: Hives    Methocarbamol  Other     Patient's wife reports cognitive difficulties (goofy)        Medications:   Outpatient Medications Prior to Visit   Medication Sig Dispense Refill    Acetaminophen  500 MG Oral Tab Take 3 tablets (1,500 mg) by mouth 2 times a day. (Patient taking differently: Take 3 tablets (1,500 mg) by mouth 2 times a day.)      Alpha-Lipoic Acid 300 MG tablet Take 300 mg by mouth 2 times a day.      amLODIPine  5 MG tablet Take 1 tablet (5 mg) by mouth 2 times a day. 180 tablet 1    atorvastatin  10 MG tablet Take 1 tablet (10 mg) by mouth daily. 90 tablet 1    BENFOTIAMINE OR Take 250 mg by mouth every morning.      betamethasone  dipropionate 0.05 % lotion Apply to scalp and ears topically 2 times a day as needed for itching. Notify your provider of any signs of infection or skin thinning. 60 mL 8    Cholecalciferol (VITAMIN D3) 2000 units Oral Cap Take 1 capsule (2,000 Units) by mouth daily. For low level 1 capsule 1    clindamycin 1 % gel Apply 1 Application  topically 2 times a day as needed. Apply to back.      cyanocobalamin  (Vitamin  B-12) 1000 MCG tablet Take 1 tablet (1,000 mcg) by mouth daily.      diclofenac  1 % gel Apply 4 g topically 4 times a day. Apply to 2 gram four times daily to neck, trapezius muscle, and low back. Use a max of 32 grams a day. 300 g 4  diclofenac  sodium 75 MG EC tablet Take 1 tablet (75 mg) by mouth 2 times a day. Take with food (Patient not taking: Reported on 01/24/2024) 60 tablet 11    DULoxetine  60 MG DR capsule Take 1 capsule (60 mg) by mouth 2 times a day. 180 capsule 1    EPINEPHrine  0.3 MG/0.3ML auto-injector Inject one pen (0.3mg /mL) into the muscle for severe allergic reaction. Call 911. Use second pen if symptoms continue 4 each 2    gabapentin  100 MG capsule Take 1 capsule (100 mg) by mouth 2 times a day. (Patient not taking: Reported on 01/24/2024) 30 capsule 1    lidocaine  5 % patch Apply 1 patch onto the skin daily as needed. Apply to painful area for up to 12 hours in a 24 hour period. 30 patch 3    lisinopril  20 MG tablet TAKE 1 TABLET BY MOUTH TWICE DAILY 200 tablet 1    MAGNESIUM  OR Take 2 tablets by mouth at bedtime.      mirabegron  ER (Myrbetriq ) 50 MG 24 hr tablet Take 1 tablet (50 mg) by mouth daily. 100 tablet 3    multivitamin with minerals tablet Take 1 tablet by mouth daily. (Patient not taking: Reported on 01/24/2024)      naltrexone  50 MG tablet Take 1 tablet (50 mg) by mouth daily. Take with food. 30 tablet 2    Omega-3 Fatty Acids (OMEGA 3 OR) Take 1 tablet by mouth every morning.      pregabalin  300 MG capsule TAKE 1 CAPSULE BY MOUTH TWICE DAILY 180 capsule 0    primidone  50 MG tablet Take 1 tablet (50 mg) by mouth 2 times a day. 180 tablet 4    Thiamine  HCl (VITAMIN B-1 OR) 100 mg.       Facility-Administered Medications Prior to Visit   Medication Dose Route Frequency Provider Last Rate Last Admin    sodium chloride  0.9 % infusion  50 mL/hr Intravenous Continuous Macario Kent, MBBS        triamcinolone  acetonide (Kenalog -40) injection 40 mg  40 mg Intralesional Once Bhatt, Mohit, DO             PSH:   Past Surgical History:   Procedure Laterality Date    L meniciscus Left 07/2017    by Dr Johnnye , re did left menisicus     PR ANES; COLONOSCOPY  2002    repeat in 3 years    PR ANES; COLONOSCOPY & POLYPECTOMY  11/18/2007    repeat in 3 years    PR CORRECTION HAMMERTOE Right 05/13/2022    Dr. Koren Cramp, DPM    PR HALLUX RIGIDUS W/CHEILECTOMY 1ST MP JT W/O IMPLT Right 10/21/2017    Dr. Redell Rhein    PR UNLISTED PROCEDURE FEMUR/KNEE      PR UNLISTED PROCEDURE HANDS/FINGERS      PR UNLISTED PROCEDURE SPINE  2013    rfa to c3 to c 7     SURGICAL HX OTHER Right 12/03/2020    RIGHT foot WOUND INCISION AND DRAINAGE    TOE SURGERY Right 02/09/2019    toe surgery  Right 01/12/2019    Dr Koren Cramp         Family History:  family history includes Colon Cancer in his father; Heart (other) in his mother; Other Family Hx in an other family member; Suicide Attempt in his brother. There is no history of Cancer.    Social History:  Social History     Socioeconomic  History    Marital status: Married     Spouse name: [name]    Number of children: 2    Years of education: Not on file    Highest education level: Not on file   Occupational History    Not on file   Tobacco Use    Smoking status: Never    Smokeless tobacco: Current     Types: [place]     Last attempt to quit: 11/28/2021    Tobacco comments:     Sometimes chews, but is trying to quit. 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  Non-smoker, 14 or more drinks/week almost  exclusively wine with dinner.      2024 still retired           Haematologist Insecurity: Not on Occupational hygienist Needs: Not on file   Intimate Partner Violence: Not on file   Housing Stability: Not on file        Physical Exam:   There were no vitals filed for this visit.     General: The patient is 73 year old year old male White  with constitutional symptom: appears age stated.    Psychiatric/mood: normal mood and affect.    Neurological: alert and oriented x3.    Vascular:   RIGHT:  Dorsalis pedis pulse is palpable.  Posterior tibial pulse is palpable. Capillary filling time note to be immediate on the distal hallux.      Neurologic:  RIGHT:  The sensation is diminished on the foot and ankle. Motor coordination is intact with normal muscle tone.     Dermatological:   RIGHT:  The skin note to have normal texture/tone/turgor.  There is no ulceration/open wound or dermatosis present.  Integument coloration is within normal range and temperature is within normal range.      Musculoskeletal:   RIGHT:  There is moderate pain on palpation on the tibial sesamoid.  There is no swelling noted over the affect region.  There is no warmth to touch of the affected area.   There is no fat pad atrophy noted.  S/P 1st MTPJ/IPJ fusion. There is NO on palpation at the 5th metatarsal styloid process.   There is mild-moderate tailor's bunion deformity noted.  There is no pain with palpation on the lateral eminence.  There is no redness over the deformity.       LEFT:    There is moderate tailor's bunion deformity noted.  There is no pain with palpation on the lateral eminence.  There is redness over the deformity.       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  We have explained to the patient  the possible contributing factor to this condition - biomechanical abnormality.  We have discussed multiple treatment options i.e.- continue current care, custom orthotics, and corticosteroid injection.  The patient elected to try corticosteroid injection.   He is not incline to custom orthotic.      INJECTION:  A corticosteroid injection was administered surrounding the sub tibial sesamoid.  The injection site was prepped with ChloraPrep.   Sprayed area with Ethyl Chloride temporarily due initial injection.  A mixture of 2% Lidocaine  with DepoMedrol (20 mg) was administered.   The purpose of the injection is to reduce symptoms.  Caution was discussed regards to injecting cortisone along the tendons which entails possible tear or rupture of the tendon.  Therefore I recommend rest and immobilization for 2 to 3 weeks and avoiding all impact activity.  I believe that injecting dexamethasone  is low risk to tendon injury.     Education and discussion took place regards to tailor's bunion/bunionette, left > right.  Discussion included spectrum of treatments available for this condition:  continue current care, wide toe box/stable shoe, and surgery.   After the discussion, the patient elected  wide toe box/stable shoe.       Return appointment: PRN or symptom worsen    TIME SPENT:  I spent a total of 30 minutes for the patient's care on the date of the service, and it is separate from time spent performing following procedure:  injection to sub tibial sesamoid (diagnostic and therapeutic).    Koren WEBER Cramp DPM, FACFAS  Reconstructive Ankle and Foot Surgeon  [facility]

## 2024-02-18 ENCOUNTER — Ambulatory Visit (HOSPITAL_BASED_OUTPATIENT_CLINIC_OR_DEPARTMENT_OTHER): Admitting: Anesthesiology

## 2024-02-18 MED ORDER — LISINOPRIL 20 MG OR TABS: 20.0000 mg | ORAL_TABLET | Freq: Two times a day (BID) | ORAL | 1 refills | Status: DC

## 2024-02-23 ENCOUNTER — Other Ambulatory Visit (INDEPENDENT_AMBULATORY_CARE_PROVIDER_SITE_OTHER): Payer: Self-pay | Admitting: Family Medicine

## 2024-02-23 ENCOUNTER — Encounter (INDEPENDENT_AMBULATORY_CARE_PROVIDER_SITE_OTHER): Payer: Self-pay | Admitting: Family Medicine

## 2024-02-23 DIAGNOSIS — G894 Chronic pain syndrome: Secondary | ICD-10-CM

## 2024-02-23 DIAGNOSIS — I10 Essential (primary) hypertension: Secondary | ICD-10-CM

## 2024-02-23 DIAGNOSIS — Z8679 Personal history of other diseases of the circulatory system: Secondary | ICD-10-CM

## 2024-02-23 DIAGNOSIS — M542 Cervicalgia: Secondary | ICD-10-CM

## 2024-02-26 ENCOUNTER — Encounter (INDEPENDENT_AMBULATORY_CARE_PROVIDER_SITE_OTHER): Payer: Self-pay | Admitting: Family Medicine

## 2024-02-26 DIAGNOSIS — M542 Cervicalgia: Secondary | ICD-10-CM

## 2024-02-26 DIAGNOSIS — Z8679 Personal history of other diseases of the circulatory system: Secondary | ICD-10-CM

## 2024-02-26 DIAGNOSIS — I10 Essential (primary) hypertension: Secondary | ICD-10-CM

## 2024-02-26 DIAGNOSIS — G894 Chronic pain syndrome: Secondary | ICD-10-CM

## 2024-02-26 MED ORDER — DULOXETINE HCL 60 MG OR CPEP
60.0000 mg | DELAYED_RELEASE_CAPSULE | Freq: Two times a day (BID) | ORAL | 1 refills | Status: AC
Start: 2024-02-26 — End: ?

## 2024-02-26 MED ORDER — AMLODIPINE BESYLATE 5 MG OR TABS
5.0000 mg | ORAL_TABLET | Freq: Two times a day (BID) | ORAL | 1 refills | Status: AC
Start: 2024-02-26 — End: ?

## 2024-02-29 NOTE — Telephone Encounter (Signed)
 LVM to let pt knows rx is ready to be pick up at his pharmacy.     Nothing further needed.    Closing encounter

## 2024-02-29 NOTE — Telephone Encounter (Signed)
 PCP signed for amlodipine  and duloxetine  on 7/26

## 2024-02-29 NOTE — Telephone Encounter (Signed)
 Pt is requesting rx refill of AMLODIPINE  AND DULOXETINE  Orders pended.    Routing to [facility], PCP, for next steps.

## 2024-03-14 ENCOUNTER — Other Ambulatory Visit (INDEPENDENT_AMBULATORY_CARE_PROVIDER_SITE_OTHER): Payer: Self-pay | Admitting: Family Medicine

## 2024-03-14 DIAGNOSIS — M792 Neuralgia and neuritis, unspecified: Secondary | ICD-10-CM

## 2024-03-14 MED ORDER — PREGABALIN 300 MG OR CAPS
300.0000 mg | ORAL_CAPSULE | Freq: Two times a day (BID) | ORAL | 0 refills | Status: DC
Start: 2024-03-14 — End: 2024-06-22

## 2024-03-14 NOTE — Telephone Encounter (Signed)
 rx refilled. Routed to clinical staff pool.

## 2024-03-15 NOTE — Telephone Encounter (Signed)
 Rx was sent to pt pharmacy. Pt was informed of refill via [facility].    PP 48 hrs to check for reply/message read/2nd attempt then OK to close.

## 2024-03-17 NOTE — Telephone Encounter (Signed)
 Ecare message marked as seen by patient.     No further questions or concerns. Closing TE.

## 2024-04-12 NOTE — Telephone Encounter (Signed)
 Prior Authorization Pending    Medication:   mirabegron  ER (Myrbetriq ) 50 MG 24 hr tablet   Quantity/Day supply:   100 / 100  Prescription Insurance:   [employer]  PAC Workflow Exemption Request:   None  Pharmacy:   [facility]   [address] [place] [address]  Phone: [phone]  Fax: [phone]    Date Submitted:   04/12/2024   Type of Request:  [x]  Cover My Meds Key: [id number]    After The Prior Authorization Center has changed the status of the request to pending, please allow a minimum of 3 to 5 business days for the insurance to respond. (some insurances may take longer).

## 2024-04-12 NOTE — Telephone Encounter (Signed)
 Prior Authorization Canceled    the [facility] Prescription Prior Auth Team has canceled this prior authorization request for mirabegron  ER (Myrbetriq ) 50 MG 24 hr tablet.      After speaking with the pharmacy,     Contacted patient's pharmacy and spoke to [place]. Delon advised they were able to get the Rx to go through for the generic. Patient paid 140. No PA needed at this time.    No further action taken/needed by the [facility] Prescription Prior Auth Team at this time

## 2024-04-12 NOTE — Telephone Encounter (Signed)
 Called pharmacy to clarify if it needs PA down the road  Pharmacy confirmed medication went through Medicare Part D. Their copay will be $150 for every three month supply  Nothing Further Needed    Closing Encounter

## 2024-04-21 ENCOUNTER — Other Ambulatory Visit (INDEPENDENT_AMBULATORY_CARE_PROVIDER_SITE_OTHER): Payer: Self-pay | Admitting: Family Medicine

## 2024-04-21 DIAGNOSIS — F1020 Alcohol dependence, uncomplicated: Secondary | ICD-10-CM

## 2024-04-23 MED ORDER — NALTREXONE HCL 50 MG OR TABS
50.0000 mg | ORAL_TABLET | Freq: Every day | ORAL | 0 refills | Status: DC
Start: 2024-04-23 — End: 2024-05-22

## 2024-04-23 NOTE — Telephone Encounter (Signed)
 Patient is due for quarterly follow up appointment per provider.  One refill authorized.  Please schedule follow up visit.

## 2024-04-24 NOTE — Telephone Encounter (Signed)
 Sent scheduling ticket and postponed for 2nd attempt

## 2024-04-26 NOTE — Telephone Encounter (Signed)
 Called pt, LVM Nothing Further Needed    Closing Encounter

## 2024-05-01 ENCOUNTER — Encounter (INDEPENDENT_AMBULATORY_CARE_PROVIDER_SITE_OTHER): Payer: Self-pay | Admitting: Family Medicine

## 2024-05-03 ENCOUNTER — Encounter (HOSPITAL_BASED_OUTPATIENT_CLINIC_OR_DEPARTMENT_OTHER): Payer: Self-pay | Admitting: Anesthesiology

## 2024-05-03 NOTE — Telephone Encounter (Signed)
 Situation/Background:   Pt asking for another ILESI.    Assessment:   01/14/24 OV:  L5-S1 ILESI    Recommendation/Follow up:   Routing to Dr. Gwinda

## 2024-05-17 NOTE — Progress Notes (Unsigned)
 [facility] Follow Up Visit  05/18/2024    Ronnie Mason;  MRN: [id number];   DOB: 08/31/1950      CHIEF COMPLAINT:   Back pain      HISTORY OF PRESENT ILLNESS:  The purpose of today's visit is interval reevaluation of low back pain. The patient was last seen by Ronnie Mason on 12/02/23 at which time we recommended:  IMPRESSION:   Ronnie Mason is a 73 year old male with   Progressive left neck pain without radiation to left lateral upper armconsistent with cervical facet joint syndrome, particularly at the bilateral C5-6, C6-7 levels. Responded well to CESI on 12/04/2022 but due to lack of radiculitis, not indicated at this time. Responded to MBB with Ronnie Mason despite post-procedural complication and infection (see procedural note by Ronnie Mason.)  Low back pain 2/2 facet arthropathy with improvement after L4-5, L5-S1 RFA and lumbosacral radiculopathy with improvement after caudal ESI. Symptoms currently worsening and likely includes components of each diagnosis. Additional components of myofascial pain syndrome, central sensitization.  Right hip pain 2/2 severe OA.  Prior treatment failures including short duration of benefit for interlaminar lumbar epidural steroids and TFESI, MBB, memantine , Naltrexone , tizanidine  (sedation).  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 Particular emphasis on gait training.  Can plan for another epidural steroid injection in the lower back. Will place referral  for the injection. Approach will depend on imaging - LESI versus caudal  Can consider another RFA pathway treatment with goal of delaying OR. Would likely plan bilateral  MBB L4-5, L5-S1.   If the above are not helpful, possible next step would be surgery.     We discussed our impressions and the following suggestions/options in detail and  provided him with information in the after visit summary.  Explained to Ronnie Mason that it would be okay to receive steroid injections from the pain clinic and his podiatrist, Dr. Luke, as long as they spaced about two weeks apart.        Medication suggestions:   Stop Gabapentin  100 mg BID given redundant mechanism with pregabalin   Consider stopping Diclofenac  tablet 75 mg BID (or trial ibuprofen or naproxen )     Continue medications below:  Pregabalin  300mg  bid  Naltrexone  50 mg  Duloxetine  60mg  bid  Lidocaine  5% patch  Tylenol  PRN  Diclofenac  gel prn    The patient reports low back pain that has been gradually worsening over the past 4-6 months. The pain is described as a constant ache that occasionally feels like a "bite." It is primarily localized to the low back but becomes more severe with movement, at which point it can spasm and radiate down the left leg. Associated symptoms include left leg weakness, tingling, numbness, and neuropathy in both feet.    Pain severity is typically 4-5/10 at rest, increasing to 7-8/10 on bad days or with activity. Sitting causes localized low back pain, whereas bending over and returning to upright significantly worsens the pain.    Symptoms are relieved by rest, medications, and use  of a heating pad in the morning. Symptoms are aggravated by activity.    The patient received an interlaminar epidural steroid injection on 04/22/23.    He is curious about any other medications to help with his pain. He takes tylenol  twice daily and would be interested in a NSAID.    Past pain treatments/diagnostic testing include (copied forward and updated  from the [facility] note dated 12/02/23):   Ronnie Mason  09/08/23 - attempted RFA to Left  C5, C6, C7. Only C5 completed due to complication of broken canula     Ronnie Mason  06/04/23 - Nerve block of the facet joint C5-6, C6-7 #2; side: bilateral   04/22/23 - LESI L1-2  03/04/23 - Caudal epidural steroid injection (required small volume)  12/04/22 - Cervical epidural steroid injection, C7-T1  05/08/22 - Trigger point injection to neck and shoulders  02/17/22 - Trigger point injection to lower back  01/09/22 - Cervical epidural steroid injection, C6-C7  10/04/21  - Cervical epidural steroid injection, C7-T1  03/14/21 - Medial branch blocks targeting the left L4-L5, L5-S1 lumbar facet joints (insufficient benefit)  10/18/20 - Trigger point injections to lower back  02/22/20 - LESI L4-5  11/03/19 - Trigger point injections to lower back and thoracic back  10/20/19 - Trigger point injections to lower back and thoracic back  10/06/19 - Trigger point injections to lower back and gluteals  05/25/19 - Medial branch blocks to the bilateral L4-L5, L5-S1 facet joints (did not proceed to RFA due to onset of radicular symptoms and worsening pain)   05/11/19 - Medial branch blocks to the bilateral L4-L5, L5-S1 facet joints  Ronnie Mason  03/23/2018 - LEFT L5 TFESI with 80-85% relief for about 3-4 weeks of just axial back pain.   09/15/2017 - Bilateral L5 TFESI with 50-75% relief of mixture of low back and leg pain.   05/19/2017 - RIGHT L5 TFESI with 100% relief of Radicular symptoms for about 3 months  Ronnie Mason  Injections in our clinic have included (exluding MBB and trigger point injections):  08/12/2016 - L5-S1 ILESI  07/01/2016 - Ultrasound guided right trochanteric bursa injection   05/20/2016 - Bilateral shoulder injections  05/13/2016 - Right L3 L4 L5/S1 RFA  02/19/2016 -  Left L3, L4, L5/S1 RFA  12/27/2012 - L5-S1 IL ESI  08/31/2012 - Left L5 TFESI  03/30/2012 - Left L4/5, L5/S1 facet inj  02/10/2012 - Left C6 C7 MB RFA  07/03/2011 - Left L3, L4,  L5/S1 RFA  05/12/2011 - Left C4, C5, C6 RFA    LABS/IMAGING:  None      Current Outpatient Medications   Medication Sig Dispense Refill    Acetaminophen  500 MG Oral Tab Take 3 tablets (1,500 mg) by mouth 2 times a day. (Patient taking differently: Take 3 tablets (1,500 mg) by mouth 2 times a day.)      Alpha-Lipoic Acid 300 MG tablet Take 300 mg by mouth 2 times a day.      amLODIPine  5 MG tablet Take 1 tablet (5 mg) by mouth 2 times a day. 200 tablet 1    atorvastatin  10 MG tablet Take 1 tablet (10 mg) by mouth daily. 90 tablet 1    BENFOTIAMINE OR Take 250 mg by mouth every morning.      betamethasone  dipropionate 0.05 % lotion Apply to scalp and ears topically 2 times a day as needed for itching. Notify your provider of any signs of infection or skin thinning.  60 mL 8    Cholecalciferol (VITAMIN D3) 2000 units Oral Cap Take 1 capsule (2,000 Units) by mouth daily. For low level 1 capsule 1    clindamycin 1 % gel Apply 1 Application  topically 2 times a day as needed. Apply to back.      cyanocobalamin  (Vitamin B-12) 1000 MCG tablet Take 1 tablet (1,000 mcg) by mouth daily.      diclofenac  1 % gel Apply 4 g topically 4 times a day. Apply to 2 gram four times daily to neck, trapezius muscle, and low back. Use a max of 32 grams a day. 300 g 4    diclofenac  sodium 75 MG EC tablet Take 1 tablet (75 mg) by mouth 2 times a day. Take with food (Patient not taking: Reported on 01/24/2024) 60 tablet 11    DULoxetine  60 MG DR capsule Take 1 capsule (60 mg) by mouth 2 times a day. 200 capsule 1    EPINEPHrine  0.3 MG/0.3ML auto-injector Inject one pen (0.3mg /mL) into the muscle for severe allergic reaction. Call 911. Use second pen if symptoms continue 4 each 2    gabapentin  100 MG capsule Take 1 capsule (100 mg) by mouth 2 times a day. (Patient not taking: Reported on 01/24/2024) 30 capsule 1    lidocaine  5 % patch Apply 1 patch onto the skin daily as needed. Apply to painful area for up to 12 hours in a 24 hour period. 30 patch  3    lisinopril  20 MG tablet Take 1 tablet (20 mg) by mouth 2 times a day. 200 tablet 1    MAGNESIUM  OR Take 2 tablets by mouth at bedtime.      mirabegron  ER (Myrbetriq ) 50 MG 24 hr tablet Take 1 tablet (50 mg) by mouth daily. 100 tablet 3    multivitamin with minerals tablet Take 1 tablet by mouth daily. (Patient not taking: Reported on 01/24/2024)      naltrexone  50 MG tablet Take 1 tablet (50 mg) by mouth daily. Take with food. 30 tablet 0    Omega-3 Fatty Acids (OMEGA 3 OR) Take 1 tablet by mouth every morning.      pregabalin  300 MG capsule TAKE 1 CAPSULE BY MOUTH TWICE DAILY 180 capsule 0    primidone  50 MG tablet Take 1 tablet (50 mg) by mouth 2 times a day. 180 tablet 4    Thiamine  HCl (VITAMIN B-1 OR) 100 mg.       Current Facility-Administered Medications   Medication Dose Route Frequency Provider Last Rate Last Admin    sodium chloride  0.9 % infusion  50 mL/hr Intravenous Continuous Macario Vinie POUR, MBBS        triamcinolone  acetonide (Kenalog -40) injection 40 mg  40 mg Intralesional Once Bhatt, Mohit, DO           REVIEW OF SYSTEMS:  The review of systems documented by our clinic staff was provided by the patient. I did review the ROS and have no further detail to add.    PHYSICAL EXAMINATION:  Blood pressure 126/82, pulse 77, SpO2 94%.   General:  healthy-appearing  well-nourished  no acute distress  Mental Status:  active  normal mood  normal affect  Alert and oriented x1 or x3   Lumbar spine:   mild pain with flexion  moderate pain with extension    positive straight leg raise (SLR) left at 30 degrees  Sensation:    decreased to pinprick over the L4- L5 S1 dermatomes  Skin:  No obvious rashes, lesions, or erythema seen       IMPRESSION:   Encounter Diagnoses   Name Primary?    Arthropathy of cervical facet joint     Cervical facet joint syndrome     Lumbosacral radiculopathy Yes       Arland Usery is a 73 year old male with chronic low back pain with radicular symptoms into the left leg,  associated with tingling, numbness, and leg weakness, consistent with lumbar radiculopathy. Symptoms have been gradually worsening over the past 4-6 months, and pain is exacerbated by activity, particularly with bending and returning upright, while improved with rest, medications, and heat. Given the chronicity and severity of symptoms, further diagnostic evaluation is recommended, including follow-up with Dr. Fernand (Neurosurgery) to discuss surgical options such as microdiscectomy versus a spinal cord stimulator (SCS) trial.    For rehabilitation, the patient should continue with daily stretching and home exercise routines. From a pharmacologic standpoint, recommend restarting diclofenac  75 mg BID as needed for pain management. Procedurally, the patient is advised to return for a lumbar interlaminar epidural steroid injection at L5-S1. An SCS trial remains a possible next step depending on response to conservative measures and the upcoming injection.    Follow-up is planned for the next procedure, and it is acceptable to schedule with Dr. Lenton if Ronnie Mason is unavailable.    PLAN:   We discussed our impressions and the following suggestions/options in detail and  provided him with information in the after visit summary.  Mr. Leverette had no further questions.    Diagnostic evaluation:   - Recommend following up with Dr. Fernand (NSGY) to discuss possible microdiscectomy vs SCS trial.    Rehabilitation:   - Continue daily stretching and home exercises.    Medication suggestions:   - Restart diclofenac  75 mg BID PRN.    Procedure suggestions:   - Return for L5-S1 Interlaminar epidural steroid injection.   - Lumbar epidural steroid injection.  For the lumbar approach we typically enter the epidural space at L5-S1 under fluoroscopic guidance. Prior to injecting local anesthetic or steroid, we inject contrast to confirm epidural spread covering the target area without vascular uptake. The medication can reduce inflammation  of irritated spinal nerves.    This video shows a great overview of the procedure: [email or web address]    We had a detailed discussion of the procedure, risks, and alternatives. The patient was encouraged to ask questions. All questions and concerns were answered to the patient's satisfaction.       - Could consider SCS trial.    Follow-up:   - For procedure. OK to schedule with Dr. Lenton if Ronnie Mason is unavailable.      Omega Gab, PA-S

## 2024-05-18 ENCOUNTER — Ambulatory Visit: Attending: Medical | Admitting: Medical

## 2024-05-18 VITALS — BP 126/82 | HR 77

## 2024-05-18 DIAGNOSIS — M47812 Spondylosis without myelopathy or radiculopathy, cervical region: Secondary | ICD-10-CM

## 2024-05-18 DIAGNOSIS — M5417 Radiculopathy, lumbosacral region: Secondary | ICD-10-CM | POA: Insufficient documentation

## 2024-05-18 MED ORDER — DICLOFENAC SODIUM 75 MG OR TBEC
75.0000 mg | DELAYED_RELEASE_TABLET | Freq: Two times a day (BID) | ORAL | 11 refills | Status: DC
Start: 2024-05-18 — End: 2024-06-19

## 2024-05-18 NOTE — Patient Instructions (Signed)
 It was my pleasure to help take part in your care today.  Thank you for coming to visit us  at the [facility]. Please contact our clinic if you have any questions or concerns.    Here is a copy of a draft of our suggestions and other pain information.     PLAN:   We discussed our impressions and the following suggestions/options in detail and  provided him with information in the after visit summary.  Ronnie Mason had no further questions.    Diagnostic evaluation:   - Recommend following up with Dr. Fernand (NSGY) to discuss possible microdiscectomy vs SCS trial.    Rehabilitation:   - Continue daily stretching and home exercises.    Medication suggestions:   - Restart diclofenac  75 mg BID PRN.    Procedure suggestions:   - Return for L5-S1 Interlaminar epidural steroid injection.   - Lumbar epidural steroid injection.  For the lumbar approach we typically enter the epidural space at L5-S1 under fluoroscopic guidance. Prior to injecting local anesthetic or steroid, we inject contrast to confirm epidural spread covering the target area without vascular uptake. The medication can reduce inflammation of irritated spinal nerves.    This video shows a great overview of the procedure: [email or web address]    We had a detailed discussion of the procedure, risks, and alternatives. The patient was encouraged to ask questions. All questions and concerns were answered to the patient's satisfaction.       - Could consider SCS trial.    Follow-up:   - For procedure. OK to schedule with Dr. Lenton if Dr. Gwinda is unavailable.    Please update PainTracker at [email or web address] prior to your next visit to the [facility].   For log in assistance or to reset your PainTracker, please call clinic at [phone], #[phone].

## 2024-05-18 NOTE — Progress Notes (Signed)
 I saw and evaluated the patient with Omega Gab, PA-S , who conducted the initial history.  I reviewed and confirmed the history in detail with Mr. Mula in person.   I was present for the examination and formulation portions of the encounter. I agree with the findings and the plan of care as documented in our notes. I did edit the trainee's note.    I spent a total of 38 minutes for the patient's care on the date of the service.          Dorn Fox, PA-C  Physician Assistant - Faculty Teaching Associate  [facility]  [facility]  Department of Anesthesiology & Pain Medicine

## 2024-05-20 ENCOUNTER — Other Ambulatory Visit (INDEPENDENT_AMBULATORY_CARE_PROVIDER_SITE_OTHER): Payer: Self-pay | Admitting: Family Medicine

## 2024-05-20 DIAGNOSIS — F1020 Alcohol dependence, uncomplicated: Secondary | ICD-10-CM

## 2024-05-22 MED ORDER — NALTREXONE HCL 50 MG OR TABS
50.0000 mg | ORAL_TABLET | Freq: Every day | ORAL | 0 refills | Status: DC
Start: 2024-05-22 — End: 2024-06-19

## 2024-05-22 NOTE — Telephone Encounter (Signed)
 Sent message via MyChart informing Pt of refill and that prior to the next fill, needs follow up.      Scheduling Ticket Sent.     CCRs please assist with scheduling. Thank you.    Postponing 48 hours to allow patient time to read and respond and for a 2nd attempt

## 2024-05-22 NOTE — Telephone Encounter (Signed)
 Last visit on 01/24/24, RTC in n about 3 months (around 04/25/2024).      Please schedule appointment for patient.     Thank you for your assistance.

## 2024-05-25 ENCOUNTER — Encounter (INDEPENDENT_AMBULATORY_CARE_PROVIDER_SITE_OTHER): Payer: Self-pay | Admitting: Family Medicine

## 2024-05-25 DIAGNOSIS — N3941 Urge incontinence: Secondary | ICD-10-CM

## 2024-05-25 NOTE — Telephone Encounter (Signed)
 Patient notified and WCB to scheduled. Nothing Further Needed    Closing Encounter

## 2024-05-26 ENCOUNTER — Encounter (HOSPITAL_BASED_OUTPATIENT_CLINIC_OR_DEPARTMENT_OTHER): Payer: Self-pay

## 2024-05-26 ENCOUNTER — Telehealth (INDEPENDENT_AMBULATORY_CARE_PROVIDER_SITE_OTHER): Payer: Self-pay | Admitting: Family Medicine

## 2024-05-26 NOTE — Telephone Encounter (Signed)
 Key: [id number]

## 2024-05-30 MED ORDER — OXYBUTYNIN CHLORIDE ER 5 MG OR TB24: 5.0000 mg | EXTENDED_RELEASE_TABLET | Freq: Every day | ORAL | 0 refills | Status: DC

## 2024-05-30 NOTE — Telephone Encounter (Signed)
 Patient following up on rx mirabegron  ER (Myrbetriq ) 50 MG 24 hr tablet. PA request received on 05/26/24.    PA previously cancelled in 01/24/24 PA TE, patient was able to fill with $150 copay.     Patient did provide information on covered alternative: Oxybutynin ER.     Routing to PCP to review - is it best to continue with Myrbetriq , or okay to prescribe Oxybutynin? If okay for Mybretriq, [facility] can follow up with pharmacy/patient.

## 2024-05-30 NOTE — Telephone Encounter (Signed)
 Urge incontinence  Alternative sent in, miragebron not covered/preferred by insurance  - oxyBUTYnin ER 5 MG 24 hr tablet; Take 1 tablet (5 mg) by mouth daily.  Dispense: 90 tablet; Refill: 0  Ronnald Killian, ARNP

## 2024-06-14 NOTE — Progress Notes (Unsigned)
 PROCEDURE NOTE       Lumbosacral Interlaminar Epidural Steroid Injection    PROCEDURE  Interlaminar epidural steroid injection L5-S1; side: ***.    PRE-PROCEDURE DIAGNOSIS  No diagnosis found.    POST-PROCEDURE DIAGNOSIS  No diagnosis found.    INDICATION  [name] has ***    ATTENDING PHYSICIAN  Derenda Picking, MD    FELLOW / RESIDENT  ***    SEDATION  {Yes No (Default) :109033}    NPO STATUS  {Yes No (Default) :109033}    INTRAVENOUS LINE  {Yes No (Default) :109033}    OPERATIVE PROCEDURE  Prior to the procedure a final verification was performed.   Mr. Gunnoe  was brought to the procedure room and placed on the exam table in a comfortable prone position. The sterile field was prepped in a sterile fashion with Chloroprep and sterile drapes were placed.   The target interlaminar space was visualized by anteroposterior fluoroscopic image. Local anesthesia was provided by infiltration of  1% lidocaine . An 18-gauge 3.5 inch Touhy needle was placed below the superior laminar edge. Using fluoroscopic guidance, the needle was advanced into the intralaminar window under multiplanar fluoroscopic control to prevent excessively deep insertion of the needle.  After engagement of the ligamentum flavum, the loss of resistance to saline technique was used to identify the epidural space. Attempted aspiration yielded no blood or cerebrospinal fluid. Subsequently, Omnipaque -240 was injected. Fluoroscopy showed ***the typical pattern of the epidural space and no vascular uptake. ***Lateral view confirmed proper epidural spread.  Injectate:  A total of dexamethasone  10 mg (1 ml) and {NUMBERS 3:100061} ml of 0.25% bupivacaine  were injected into the epidural space. The needle was then withdrawn uneventfully.    POST-PROCEDURE  Mr. Leder  was transported to the recovery room for observation. Where he  made an uneventful recovery.  Mr. Dragoo was given detailed discharge instructions.     COMPLICATIONS  ***None.     PLAN  Pain  diary.  Subsequent care will depend on the results of today's procedure.   Follow up in the clinic as scheduled.      ***ATTENDING STATEMENT  I personally performed the procedure

## 2024-06-16 ENCOUNTER — Other Ambulatory Visit (INDEPENDENT_AMBULATORY_CARE_PROVIDER_SITE_OTHER): Payer: Self-pay | Admitting: Family Medicine

## 2024-06-16 DIAGNOSIS — F1020 Alcohol dependence, uncomplicated: Secondary | ICD-10-CM

## 2024-06-19 ENCOUNTER — Ambulatory Visit

## 2024-06-19 ENCOUNTER — Encounter (HOSPITAL_BASED_OUTPATIENT_CLINIC_OR_DEPARTMENT_OTHER): Payer: Self-pay

## 2024-06-19 VITALS — BP 141/108 | HR 78 | Temp 97.6°F | Resp 16

## 2024-06-19 DIAGNOSIS — M5417 Radiculopathy, lumbosacral region: Secondary | ICD-10-CM | POA: Insufficient documentation

## 2024-06-19 MED ORDER — DEXAMETHASONE SOD PHOSPHATE PF 10 MG/ML IJ SOLN
INTRAMUSCULAR | Status: AC
Start: 2024-06-19 — End: 2024-06-19
  Filled 2024-06-19: qty 1

## 2024-06-19 MED ORDER — IOHEXOL 240 MG/ML IJ SOLN
10.0000 mL | Freq: Once | INTRAMUSCULAR | Status: AC
Start: 2024-06-19 — End: 2024-06-19
  Administered 2024-06-19: 10 mL via EPIDURAL

## 2024-06-19 MED ORDER — NALTREXONE HCL 50 MG OR TABS: 50.0000 mg | ORAL_TABLET | Freq: Every day | ORAL | 0 refills | Status: DC

## 2024-06-19 MED ORDER — DEXAMETHASONE SODIUM PHOSPHATE 10 MG/ML IJ SOLN
10.0000 mg | Freq: Once | INTRAMUSCULAR | Status: AC
Start: 2024-06-19 — End: 2024-06-19
  Administered 2024-06-19: 10 mg

## 2024-06-19 NOTE — Patient Instructions (Signed)
 [facility]  Post-Procedure Discharge Instructions      After the procedure:  Immediate and complete pain relief is rare  Numbness and/or weakness in the area of your body supplied by the injected nerve; these symptoms should resolve but may last up to several hours  Some soreness and bruising at the injection site(s)    Activities:  If you have any weakness or numbness caused by the injection, DO NOT DRIVE or operate machinery and limit other activity until sensation returns to normal.  You may resume regular exercises/activities as tolerated.  If you received sedation, DO NOT DRIVE or operate machinery for 24 hours.    Medications:  If you stopped taking any blood thinning medications such as Coumadin or Plavix, you may resume these tomorrow unless specified differently by the prescribing physician.    Site care:  You may remove the band-aid after 6 hours.  You may shower today. No swimming, tub baths or hot tubs for 24 hours following your procedure.  For the first 48 hours, apply ice packs to the injection site for 15-20 minutes hourly as needed for comfort.  Wrap a light towel or cloth around ice packs and heating pad to protect the skin.  After 48 hours, use a warm heating pad to the injection site for 15-20 minutes hourly as needed for comfort.    If you received steroids today:  Steroid medications may cause facial flushing, occasional low-grade fevers, hiccups, insomnia, headaches, water retention, increased appetite, increased heart rate, and abdominal cramping or bloating. These side effects occur in only about 5 percent of patients and commonly disappear within one to three days after the injection.  If you are diabetic check your blood sugar more frequently than usual as you may develop an increase in blood sugar for the next 10-14 days. Contact your diabetes physician if this occurs.    Call us  if you develop any of the following symptoms in the next 7 days:  Fever above 100 degrees  F     Any unusual increase in your level of pain  Swelling, bleeding, redness, or increased tenderness at the procedure or IV site  Headache not relieved by Tylenol  (if you had an epidural steroid injection)  .    Contact us :  Anytime, 24 hours/day, 7 days/week at [phone], option 2, option 2.      [facility]  Post-Procedure Discharge Instructions      After the procedure:  Immediate and complete pain relief is rare  Numbness and/or weakness in the area of your body supplied by the injected nerve; these symptoms should resolve but may last up to several hours  Some soreness and bruising at the injection site(s)    Activities:  If you have any weakness or numbness caused by the injection, DO NOT DRIVE or operate machinery and limit other activity until sensation returns to normal.  You may resume regular exercises/activities as tolerated.  If you received sedation, DO NOT DRIVE or operate machinery for 24 hours.    Medications:  If you stopped taking any blood thinning medications such as Coumadin or Plavix, you may resume these tomorrow unless specified differently by the prescribing physician.    Site care:  You may remove the band-aid after 6 hours.  You may shower today. No swimming, tub baths or hot tubs for 24 hours following your procedure.  For the first 48 hours, apply ice packs to the injection site for 15-20 minutes hourly  as needed for comfort.  Wrap a light towel or cloth around ice packs and heating pad to protect the skin.  After 48 hours, use a warm heating pad to the injection site for 15-20 minutes hourly as needed for comfort.    If you received steroids today:  Steroid medications may cause facial flushing, occasional low-grade fevers, hiccups, insomnia, headaches, water retention, increased appetite, increased heart rate, and abdominal cramping or bloating. These side effects occur in only about 5 percent of patients and commonly disappear within one to three days after the  injection.  If you are diabetic check your blood sugar more frequently than usual as you may develop an increase in blood sugar for the next 10-14 days. Contact your diabetes physician if this occurs.    Call us  if you develop any of the following symptoms in the next 7 days:  Fever above 100 degrees F     Any unusual increase in your level of pain  Swelling, bleeding, redness, or increased tenderness at the procedure or IV site  Headache not relieved by Tylenol  (if you had an epidural steroid injection)  .    Contact us :  Anytime, 24 hours/day, 7 days/week at [phone], option 2, option 2.      [facility]  Patient Self-Administered 6 Hour Pain Diary  Ronnie Mason   Procedure Date: 06/19/24  Procedure Name: L5-S1 ILESI  Procedure Attending: Cho    1.  For accuracy keep this diary with you during the 6 hour period so you can write down your pain scores as you go, at the time noted on your diary.  During the 6 hours, please document your pain scores while doing activities that provoke your pain as well as when you are not doing those activities.      2.  Within 24 hours of completing this pain score diary, please send us  your pain score diary either by:  1) Scanning into MyChart  2)  Faxing to [phone] or 3)  Calling the pain score voicemail line at [phone].  Leave the spelling of your first and last name,  U#, date of birth, date of service and the 20 numbers you wrote below, including pre-procedure, immediately post procedure and % pain relief.  After that, you may leave any comments regarding your post block experience that may be helpful (e.g. was able to walk, much worse, had to take medications).      Use this scale to rate your pain  0 1 2 3 4 5 6 7 8 9  10  No pain                                          worst pain     TIME PAIN SCORE WITH PAINFUL ACTIVITY PAIN SCORE WITHOUT PAINFUL ACTIVITY   Before injections:       Immediately after injections:       30 minutes after  injections:       1 hour after injections:       2 hours after injections:       3 hours after injections:       4 hours after injections:       5 hours after injections:       6 hours after injections:       When I had the lowest pain score,  my % (percentage) pain relief:                        %                 %       IT IS ESSENTIAL YOU CALL IN YOUR PAIN DIARY RESULTS BEFORE ANY FURTHER PROCEDURE(S) CAN BE SCHEDULED.         [facility]  Patient Self-Administered Pain Diary  1 month 07/19/24        The procedure you just had was done in hopes of providing long term pain relief. Depending on the type of procedure you had, you will need to answer the questions below 1 month after your procedure.  Accurate completion and timely reporting of your pain diary(s) enables your Pain MD to review the results and make further treatment recommendations.    1 month after your procedure, write your answers to the 4 questions below and send your pain score diary to us  either by:      1)  Sending a picture via MyChart   2)  Faxing to [phone]  3)  Calling the pain score voicemail line at [phone].  Leave the spelling of your first and last name, U#, date of birth and date of procedure and the answers to all of the 4 questions below.             Use this scale to rate your pain  0 1 2 3 4 5 6 7 8 9  10  No pain                      worst pain      Ronnie Mason   Procedure Date: 06/19/24  Procedure Name: L5-S1 ILESI  Procedure Attending: Cho    1.  Pain score immediately before procedure: __________    2. 1 month after your procedure, do you feel better?  YES/NO    3.  If you answered YES to the above, by what percentage__________% (0-100%) has your pain been relieved.    4.  Pain Score now: _________ (at time of telephone call)           [facility]  Patient Self-Administered Pain Diary  3 months 09/20/23        The procedure you just had was done in hopes of providing long term pain relief.  Depending on the type of procedure you had, you will need to answer the questions below 3 months after your procedure.  Accurate completion and timely reporting of your pain diary(s) enables your Pain MD to review the results and make further treatment recommendations.    3 months after your procedure, write your answers to the 4 questions below and send your pain score diary to us  either by:      1)  Sending a picture via MyChart   2)  Faxing to [phone]  3)  Calling the pain score voicemail line at [phone].  Leave the spelling of your first and last name, U#, date of birth and date of procedure and the answers to all of the 4 questions below.             Use this scale to rate your pain  0 1 2 3 4 5 6 7 8 9  10  No pain  worst pain      Ronnie Mason   Procedure Date: 06/19/24  Procedure Name: L5-S1 ILESI  Procedure Attending: Cho    1.  Pain score immediately before procedure: __________    2. 3 months after your procedure, do you feel better?  YES/NO    3.  If you answered YES to the above, by what percentage__________% (0-100%) has your pain been relieved.    4.  Pain Score now: _________ (at time of telephone call)

## 2024-06-19 NOTE — Progress Notes (Signed)
 Ronnie Mason identified by name and DOB.   He confirmed he is having a L5-S1 ILESI  He was admitted to procedure area using a cane  He has not fallen in the last 6 months.  Low fall risk.  Fall prevention interventions: Patient wearing shoes  He denies taking any blood thinners.  He has held his medications per provider instructions.  He denies having a bleeding disorder.  He has not been on antibiotics for the past two weeks.   He denies a history of fainting during medical procedures.   He has not been NPO.  He undressed self without nursing assistance.  PIV not indicated for procedure.  I verbally reviewed written discharge instructions and pain diary with him.   New consent placed on chart  Confirmed ride home with WIFE, [name] at bedside  He has a pre-procedure pain score of 6 Low back

## 2024-06-19 NOTE — Telephone Encounter (Signed)
 Patient last seen on 01/24/24 and was to return in 3 months.  One refill authorized.  Please schedule follow up visit.

## 2024-06-19 NOTE — Progress Notes (Signed)
 Pain score prior to procedure:  6/10  Pain score after procedure:  5/10    Mr. Cuellar met discharge criteria:  A/OX4/baseline, VSS/returned to baseline. Mobility returned to baseline prior to discharge. Procedure site clean dry and intact. Written and verbal discharge instructions, including emergency contact phone numbers, reviewed with Mr. Fischler. Verbal understanding obtained from him. Mr. Vines dressed self without nursing assistance. He was discharged in stable condition from procedure area with cane to home with all belongings and with ride/responsible person.

## 2024-06-19 NOTE — Progress Notes (Signed)
 I was present for the entire procedure (lumbar epidural steroid injection) which was performed under my direct personal supervision.   I reviewed the documentation of the other providers and concur with Dr. Claretta Even MD's findings. I edited the procedure note.      Imaging guidance was used for this procedure and representative images were saved. I personally viewed these images.     Derenda Picking, MD  [facility]  [facility]   Department of Anesthesiology and Pain Medicine

## 2024-06-19 NOTE — Telephone Encounter (Signed)
 Relayed rx approval via [facility]   PP 48 hrs to confirm msg read

## 2024-06-20 ENCOUNTER — Other Ambulatory Visit (INDEPENDENT_AMBULATORY_CARE_PROVIDER_SITE_OTHER): Payer: Self-pay | Admitting: Family Medicine

## 2024-06-20 DIAGNOSIS — M542 Cervicalgia: Secondary | ICD-10-CM

## 2024-06-20 DIAGNOSIS — M5417 Radiculopathy, lumbosacral region: Secondary | ICD-10-CM

## 2024-06-20 DIAGNOSIS — M792 Neuralgia and neuritis, unspecified: Secondary | ICD-10-CM

## 2024-06-21 MED ORDER — LIDOCAINE 5 % EX PTCH
1.0000 | MEDICATED_PATCH | Freq: Every day | CUTANEOUS | 0 refills | Status: AC | PRN
Start: 2024-06-21 — End: ?

## 2024-06-21 NOTE — Telephone Encounter (Signed)
 Sent scheduling ticket and postponed for 2nd attempt

## 2024-06-21 NOTE — Telephone Encounter (Signed)
 Patient is due for 3 month follow-up appointment.  One refill authorized.  Please schedule follow up visit.

## 2024-06-22 MED ORDER — PREGABALIN 300 MG OR CAPS
300.0000 mg | ORAL_CAPSULE | Freq: Two times a day (BID) | ORAL | 0 refills | Status: AC
Start: 2024-06-22 — End: ?

## 2024-06-22 NOTE — Telephone Encounter (Signed)
 2nd attempt - left voicemail for patient informing of refill and asking them to schedule.     Nothing Further Needed    Closing Encounter

## 2024-06-22 NOTE — Telephone Encounter (Signed)
 Relayed rx approval via [facility]   Leaving open to confirm msg read

## 2024-06-22 NOTE — Telephone Encounter (Signed)
 Rx approved. Please let patient know. Thanks!

## 2024-06-23 NOTE — Telephone Encounter (Signed)
 Pt read MyChart message with scheduling ticket.     Nothing Further Needed    Closing Encounter

## 2024-06-26 NOTE — Telephone Encounter (Signed)
Notified pt. Nothing further needed.    Closing encounter

## 2024-07-05 ENCOUNTER — Other Ambulatory Visit (INDEPENDENT_AMBULATORY_CARE_PROVIDER_SITE_OTHER): Payer: Self-pay | Admitting: Family

## 2024-07-05 ENCOUNTER — Telehealth (INDEPENDENT_AMBULATORY_CARE_PROVIDER_SITE_OTHER): Payer: Self-pay | Admitting: Podiatrist

## 2024-07-05 DIAGNOSIS — E785 Hyperlipidemia, unspecified: Secondary | ICD-10-CM

## 2024-07-05 NOTE — Telephone Encounter (Signed)
 General Message:    Detailed Message: Pt indicates that he had surgery about 2 yrs ago. His surgery site had completely healed. Within the last 24hrs, the site where he had sutures opened up and bled. No known injury. Pt called and is requesting an appt with Dr. Luke next week. There are no openings at this time. Please advise how pt should proceed for care and treatment given this concerning symptom.   Return Call: General message okay

## 2024-07-05 NOTE — Telephone Encounter (Signed)
 Patient called and said he stepped on nails this morning and had alittle bit of bleeding at the heel and big toe joint. He stated he was not concerned but wanted to follow up with Dr.Kim. I reccommended he go to urgent care he stated he will not go to urgent care, we scheduled appointment with Dr.Kim on 12/5 at 9:30am . After reviewing with Dr.Kim called patient back and confirmed nail was new and his tetanus is up to date, he again refused to go to the Er or urgent care

## 2024-07-07 ENCOUNTER — Ambulatory Visit (INDEPENDENT_AMBULATORY_CARE_PROVIDER_SITE_OTHER): Admitting: Podiatrist

## 2024-07-07 DIAGNOSIS — S91131A Puncture wound without foreign body of right great toe without damage to nail, initial encounter: Secondary | ICD-10-CM

## 2024-07-07 DIAGNOSIS — M722 Plantar fascial fibromatosis: Secondary | ICD-10-CM

## 2024-07-07 MED ORDER — ATORVASTATIN CALCIUM 10 MG OR TABS: 10.0000 mg | ORAL_TABLET | Freq: Every day | ORAL | 0 refills | Status: AC

## 2024-07-07 NOTE — Telephone Encounter (Signed)
 Visit request already sent in another encounter.

## 2024-07-07 NOTE — Progress Notes (Signed)
 Accompanied by:  patient was unaccompanied    Referring provider/PCP:  Habekoss, Stephannie RAMAN, MD  The patient states to the area of pain/concern is right foot. Heel area.  The duration of current condition is 2 day(s). DOI 07/05/24  It began suddenly.    The precipitating event/mechanism of injury was stepped on nails.    Current symptoms are pain with activity, pain with pressure, and tender.    Patient denies: nothing.    The quality of pain is aching and sharp.    The level the pain/severity of pain is minimal.    The condition is is slightly improved.    The affected area is made worse by pressure.    Past treatments/studies include nothing .    The patient states slight improvement with treatment.    Previous diagnostic test/evaluation:  none.    Patient's activity limitation is limited in walking and limited in recreation.
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
## 2024-07-11 ENCOUNTER — Encounter (INDEPENDENT_AMBULATORY_CARE_PROVIDER_SITE_OTHER): Payer: Self-pay | Admitting: Family Medicine

## 2024-07-11 ENCOUNTER — Other Ambulatory Visit (INDEPENDENT_AMBULATORY_CARE_PROVIDER_SITE_OTHER): Payer: Self-pay | Admitting: Family Medicine

## 2024-07-11 DIAGNOSIS — G250 Essential tremor: Secondary | ICD-10-CM

## 2024-07-13 MED ORDER — PRIMIDONE 50 MG OR TABS: 50.0000 mg | ORAL_TABLET | Freq: Two times a day (BID) | ORAL | 0 refills | Status: AC

## 2024-07-13 NOTE — Telephone Encounter (Signed)
 Patient sent f/u [facility] on 07/11/24, reporting he is almost out of medication:       Darsh Vandevoort to P [facility] Clinical Staff Pool (supporting Stephannie GORMAN Lace, MD)       07/11/24  9:48 AM  Hello. Could you please order a new prescription of Primidone  50mg  TAB OXFO?  I didnt realize I was almost out & have only enough for today. I contacted [facility] , [address], and they can give me 3 days today. They should be faxing you a request also.   Thank you,   Ronnell Clinger 08/21/1950    Last fill of primidone  50 MG tablet  was on 03/10/23 for #180 with 4 refills  Last OV was on 01/24/24 with PCP.    Pharmacy and medication loaded below. Ok to refill?

## 2024-07-13 NOTE — Telephone Encounter (Signed)
 Patient following up on 07/11/24 refill TE re: Primidone  50MG      Will route request to PCP 12/9 TE.     Nothing Further Needed    Closing Encounter

## 2024-07-18 NOTE — Telephone Encounter (Signed)
 Routing to provider for advisement    Does the request meet the following criteria:     the physician believes that waiting for a decision under the standard time frame could place the enrollee's life, health, ability  to regain maximum function in serious jeopardy, or urgent traveling?    If yes - please explain clinical rationale for urgency. Please cite labs or visit notes with   supporting documentation. And route high priority to [facility] Prescription Prior Authorization Pool.

## 2024-07-18 NOTE — Telephone Encounter (Signed)
 Prior Authorization Canceled    the [facility] Prescription Prior Auth Team has canceled this prior authorization request for mirabegron  ER (Myrbetriq ) 50 MG 24 hr tablet .      After reviewing the patient chart,   Discontinued: the prescription was discontinued in the med-list on 05/30/2024.    No further action taken/needed by the [facility] Prescription Prior Auth Team at this time

## 2024-07-19 ENCOUNTER — Other Ambulatory Visit (INDEPENDENT_AMBULATORY_CARE_PROVIDER_SITE_OTHER): Payer: Self-pay | Admitting: Family Medicine

## 2024-07-19 DIAGNOSIS — F1020 Alcohol dependence, uncomplicated: Secondary | ICD-10-CM

## 2024-07-20 MED ORDER — NALTREXONE HCL 50 MG OR TABS: 50.0000 mg | ORAL_TABLET | Freq: Every day | ORAL | 0 refills | Status: DC

## 2024-07-26 ENCOUNTER — Telehealth (HOSPITAL_BASED_OUTPATIENT_CLINIC_OR_DEPARTMENT_OTHER): Payer: Self-pay

## 2024-07-26 ENCOUNTER — Other Ambulatory Visit (HOSPITAL_BASED_OUTPATIENT_CLINIC_OR_DEPARTMENT_OTHER): Payer: Self-pay | Admitting: Anesthesiology

## 2024-07-26 DIAGNOSIS — M47812 Spondylosis without myelopathy or radiculopathy, cervical region: Secondary | ICD-10-CM

## 2024-07-26 DIAGNOSIS — M542 Cervicalgia: Secondary | ICD-10-CM

## 2024-07-26 DIAGNOSIS — M5412 Radiculopathy, cervical region: Secondary | ICD-10-CM

## 2024-07-26 NOTE — Telephone Encounter (Signed)
 Received a call from Latricia at [facility].  Dr. Fernand is asking that Ronnie Mason write an order for an MRI of the cervical spine and is asking that the MRI be completed by the time of this patient's appointment with Dr. Fernand on 08/04/24.

## 2024-07-26 NOTE — Telephone Encounter (Signed)
 Situation/Background: Dr. Fernand from neurosurgery is requesting Ronnie Mason order an MRI    OV with Ronnie Fox PA on 05/18/24  Ronnie Mason is a 73 year old male with chronic low back pain with radicular symptoms into the left leg, associated with tingling, numbness, and leg weakness, consistent with lumbar radiculopathy. Symptoms have been gradually worsening over the past 4-6 months, and pain is exacerbated by activity, particularly with bending and returning upright, while improved with rest, medications, and heat. Given the chronicity and severity of symptoms, further diagnostic evaluation is recommended, including follow-up with Dr. Fernand (Neurosurgery) to discuss surgical options such as microdiscectomy versus a spinal cord stimulator (SCS) trial.     OV with Dr. Gwinda on 12/02/23    IMPRESSION:   Ronnie Mason is a 73 year old male with   Progressive left neck pain without radiation to left lateral upper armconsistent with cervical facet joint syndrome, particularly at the bilateral C5-6, C6-7 levels. Responded well to CESI on 12/04/2022 but due to lack of radiculitis, not indicated at this time. Responded to MBB with Dr. Alena despite post-procedural complication and infection (see procedural note by Dr. Alena.)    Assessment: Pt has appt with Dr. Fernand on 08/04/24 [facility]    Recommendation/Follow up: Ronnie Mason OOO, routing to Dr. [name] for MRI orders per Dr. Kathern request to be done prior to 1/2/6

## 2024-07-28 ENCOUNTER — Encounter (INDEPENDENT_AMBULATORY_CARE_PROVIDER_SITE_OTHER): Payer: Self-pay | Admitting: Nurse Practitioner

## 2024-07-28 ENCOUNTER — Telehealth (HOSPITAL_BASED_OUTPATIENT_CLINIC_OR_DEPARTMENT_OTHER): Payer: Self-pay

## 2024-07-28 ENCOUNTER — Ambulatory Visit (INDEPENDENT_AMBULATORY_CARE_PROVIDER_SITE_OTHER): Admitting: Nurse Practitioner

## 2024-07-28 VITALS — BP 130/73 | HR 80 | Temp 97.3°F | Wt 236.0 lb

## 2024-07-28 DIAGNOSIS — G250 Essential tremor: Secondary | ICD-10-CM

## 2024-07-28 DIAGNOSIS — Z789 Other specified health status: Secondary | ICD-10-CM

## 2024-07-28 DIAGNOSIS — F1020 Alcohol dependence, uncomplicated: Secondary | ICD-10-CM

## 2024-07-28 DIAGNOSIS — E785 Hyperlipidemia, unspecified: Secondary | ICD-10-CM

## 2024-07-28 DIAGNOSIS — R635 Abnormal weight gain: Secondary | ICD-10-CM

## 2024-07-28 DIAGNOSIS — Z7409 Other reduced mobility: Secondary | ICD-10-CM

## 2024-07-28 NOTE — Telephone Encounter (Signed)
 Please see TE dated 07/26/24 for continued documentation. Reaching out to Dr. Kathern team for clarification. Closing duplicate encounter.

## 2024-07-28 NOTE — Progress Notes (Unsigned)
{  Vanishing Tip  [id number] E&M codes are now billed on either MDM or total time. You can document only what is medically necessary. :999}   Chief Complaint   Patient presents with    Follow-Up      Reviewing medications and would like to discuss weight loss medication.         Subjective: {VT  Prob List  Medical Hx  Surgical Hx  Family Hx  Substance & Sexual Hx  Social Documentation  Obstetric Hx   Birth Hx :999}    Ronnie Mason is a 73 year old male who presents on 07/28/2024.    Weight loss medications  Shortness of breath - has been happening over the last couple of years; partially related to weight gain, also due to low back and foot pain; with movement/walking short distances    1 bottle of wine or more split between him and his wife       Objective: {VT  Vitals  Vitals Flowsheet  Labs  Imaging :999}   Vitals: BP 130/73   Pulse 80   Temp 36.3 C (Temporal)   Wt (!) 107 kg (236 lb)   SpO2 96%   BMI 28.73 kg/m   Physical Exam   ***       Assessment and Plan: {VT  Meds  HM  Care Gaps :999}   {VT  LOS Calculator  Job aid  AMA's MDM grid :999}  {Assessment & Plan:115017}    Increase naltrexone  from 50 mg to 100 mg

## 2024-07-28 NOTE — Telephone Encounter (Signed)
 9:41 AM Incoming call from Rock Ned (spouse).    Last Office Visit: 05/18/2024 with Lam, PA-C  Next Office Visit:  10/13/2024 with Dr. Gwinda    Chief complaint: MRI order for lower back instead of neck    Discussion: Rock calls to request new MRI order for lower back from Dr. Gwinda. Patient is scheduled for MRI on 08/11/24 and with Dr. Fernand at Neurosurgery on 08/04/24.    Follow up:  Rock prefers [facility] [facility] for follow up.  Verified with Rock that [phone] is the preferred phone number to reach her for follow up as needed.  Will route message to Pain Clinical Support Staff RN Pool for review and completion.    Patient verbalized understanding.

## 2024-07-29 LAB — CBC, DIFF
% Basophils: 2 %
% Eosinophils: 3 %
% Immature Granulocytes: 1 %
% Lymphocytes: 21 %
% Monocytes: 12 %
% Neutrophils: 61 %
% Nucleated RBC: 0 %
Absolute Eosinophil Count: 0.15 10*3/uL (ref 0.00–0.50)
Absolute Lymphocyte Count: 1.19 10*3/uL (ref 1.00–4.80)
Basophils: 0.09 10*3/uL (ref 0.00–0.20)
Hematocrit: 44 % (ref 38.0–50.0)
Hemoglobin: 14.7 g/dL (ref 13.0–18.0)
Immature Granulocytes: 0.03 10*3/uL (ref 0.00–0.05)
MCH: 34.3 pg — ABNORMAL HIGH (ref 27.3–33.6)
MCHC: 33.6 g/dL (ref 32.2–36.5)
MCV: 102 fL — ABNORMAL HIGH (ref 81–98)
Monocytes: 0.67 10*3/uL (ref 0.00–0.80)
Neutrophils: 3.51 10*3/uL (ref 1.80–7.00)
Nucleated RBC: 0 10*3/uL
Platelet Count: 198 10*3/uL (ref 150–400)
RBC: 4.29 10*6/uL — ABNORMAL LOW (ref 4.40–5.60)
RDW-CV: 14.2 % (ref 11.0–14.5)
WBC: 5.64 10*3/uL (ref 4.3–10.0)

## 2024-07-29 LAB — COMPREHENSIVE METABOLIC PANEL
ALT (GPT): 26 U/L (ref 10–48)
AST (GOT): 31 U/L (ref 9–38)
Albumin: 4.4 g/dL (ref 3.5–5.2)
Alkaline Phosphatase (Total): 81 U/L (ref 36–161)
Anion Gap: 6 (ref 4–12)
Bilirubin (Total): 0.5 mg/dL (ref 0.2–1.3)
Calcium: 8.9 mg/dL (ref 8.9–10.2)
Carbon Dioxide, Total: 27 meq/L (ref 22–32)
Chloride: 104 meq/L (ref 98–108)
Creatinine: 1.09 mg/dL (ref 0.51–1.18)
Glucose: 104 mg/dL (ref 62–125)
Potassium: 4.7 meq/L (ref 3.6–5.2)
Protein (Total): 6.8 g/dL (ref 6.0–8.2)
Sodium: 137 meq/L (ref 135–145)
Urea Nitrogen: 24 mg/dL — ABNORMAL HIGH (ref 8–21)
eGFR by CKD-EPI 2021: >60 mL/min/1.73_m2 (ref >59)

## 2024-07-29 LAB — HEMOGLOBIN A1C, HPLC: Hemoglobin A1C: 5.2 % (ref 4.0–5.6)

## 2024-07-29 LAB — LIPID PANEL
Cholesterol/HDL Ratio: 1.7
HDL Cholesterol: 135 mg/dL (ref >39)
LDL Cholesterol, NIH Equation: 60 mg/dL (ref <130)
Non-HDL Cholesterol: 95 mg/dL (ref 0–159)
Total Cholesterol: 230 mg/dL — ABNORMAL HIGH (ref <200)
Triglyceride: 229 mg/dL — ABNORMAL HIGH (ref <150)

## 2024-07-29 LAB — TSH WITH REFLEXIVE FREE T4: Thyroid Stimulating Hormone: 1.067 u[IU]/mL (ref 0.400–5.000)

## 2024-07-31 ENCOUNTER — Other Ambulatory Visit (HOSPITAL_BASED_OUTPATIENT_CLINIC_OR_DEPARTMENT_OTHER): Payer: Self-pay | Admitting: Anesthesiology

## 2024-07-31 ENCOUNTER — Ambulatory Visit (INDEPENDENT_AMBULATORY_CARE_PROVIDER_SITE_OTHER): Payer: Self-pay | Admitting: Nurse Practitioner

## 2024-07-31 DIAGNOSIS — M5416 Radiculopathy, lumbar region: Secondary | ICD-10-CM

## 2024-07-31 LAB — PRIMIDONE AND PHENOBARBITAL
Phenobarbital: <2.4 ug/mL — ABNORMAL LOW (ref 10.0–40.0)
Primidone (Sendout): 5.5 ug/mL (ref 5.0–12.0)

## 2024-08-04 ENCOUNTER — Ambulatory Visit: Admitting: Student in an Organized Health Care Education/Training Program

## 2024-08-04 VITALS — BP 128/79 | HR 75 | Temp 97.1°F | Resp 18 | Ht 76.0 in | Wt 235.0 lb

## 2024-08-04 DIAGNOSIS — G8929 Other chronic pain: Secondary | ICD-10-CM

## 2024-08-04 DIAGNOSIS — M5416 Radiculopathy, lumbar region: Secondary | ICD-10-CM | POA: Insufficient documentation

## 2024-08-04 DIAGNOSIS — M5417 Radiculopathy, lumbosacral region: Secondary | ICD-10-CM | POA: Insufficient documentation

## 2024-08-04 DIAGNOSIS — M542 Cervicalgia: Secondary | ICD-10-CM

## 2024-08-04 DIAGNOSIS — M545 Low back pain, unspecified: Secondary | ICD-10-CM

## 2024-08-04 NOTE — Progress Notes (Signed)
 Ronnie Mason is a 74 year old male presenting with complaints of chronic neck pain (L>R) and chronic lower back pain (L>R). Patient is accompanied by his wife at today's visit. Patient was last seen in 2024 and reports ongoing L>R neck pain radiating into his left trapezius and into his shoulder. Pain can occasionally radiate into his LUE terminating at the elbow. Denies RUE pain. He also notes ongoing lower back pain (L>R) which radiates down his posterior left leg, terminating at his calf. Persistent numbness and tingling in a similar distribution. Denies changes in leg strength or gait, but does feel ongoing gait instability. He tried a series of lumbar injections (L5/S1 ILESI on 01/2024 and 06/2024 with minimal relief). He discussed a possible SCS trial, but was referred back to our clinic for discussion of possible surgical options.    I had a thorough conversation with the patient and his wife. At this time, patient does NOT want to pursue surgical interventions and would prefer to proceed with SCS trial. He was under the impression that this was the goal of today's visit. I explained that our office would primarily focus on surgical interventions and would need to see him back after updated imaging has been complete. Given that patient does not want to pursue surgery, I offered to refer him back to the pain clinic to proceed with the SCS trial. I will confirm if the pain clinic needs him to complete the MRI C and L spine scheduled on 1/9, and if not, will have the office cancel. In the case patient wants to pursue surgery in the future, he was advised to follow-up with updated imaging. Both he and his wife expressed understanding.     Plan:  - Patient does NOT want surgery, would like to proceed with SCS trial. Will refer back to pain clinic.   - If patient would like surgery, can follow-up with updated MRI C and L spine imaging.       Jori Grills  Nurse Practitioner  Department of Neurological Surgery    [facility]

## 2024-08-11 ENCOUNTER — Ambulatory Visit
Admit: 2024-08-11 | Discharge: 2024-08-11 | Disposition: A | Discharge status: Home / Self Care | Attending: Anesthesiology | Admitting: Anesthesiology

## 2024-08-11 ENCOUNTER — Encounter (INDEPENDENT_AMBULATORY_CARE_PROVIDER_SITE_OTHER): Payer: Self-pay | Admitting: Nurse Practitioner

## 2024-08-11 DIAGNOSIS — E785 Hyperlipidemia, unspecified: Secondary | ICD-10-CM

## 2024-08-11 DIAGNOSIS — Z6828 Body mass index (BMI) 28.0-28.9, adult: Secondary | ICD-10-CM

## 2024-08-11 DIAGNOSIS — F1020 Alcohol dependence, uncomplicated: Secondary | ICD-10-CM

## 2024-08-11 DIAGNOSIS — R635 Abnormal weight gain: Secondary | ICD-10-CM

## 2024-08-11 DIAGNOSIS — M47812 Spondylosis without myelopathy or radiculopathy, cervical region: Secondary | ICD-10-CM

## 2024-08-11 DIAGNOSIS — M5412 Radiculopathy, cervical region: Secondary | ICD-10-CM

## 2024-08-11 NOTE — Telephone Encounter (Signed)
 Patient stopped by clinic asking to speak with RN. Patient is requesting to speak with RN.    Patient is asking for MRI for neck to be submitted.

## 2024-08-11 NOTE — Telephone Encounter (Signed)
 Per Dr. Gwinda, orders are in for both Cervical and Lumbar MRI's and pt can get both of those done.  Called pt and relayed this message to him and his wife.  He was scheduled for his neck MRI today and is scheduled for his lumbar MRI on 09/16/24.  Pt and wife seemed confused about getting neck MRI and advised them if pt didn't get neck MRI today, he can reschedule it. They stated understanding had had no further questions.

## 2024-08-16 ENCOUNTER — Other Ambulatory Visit (INDEPENDENT_AMBULATORY_CARE_PROVIDER_SITE_OTHER): Payer: Self-pay | Admitting: Family Medicine

## 2024-08-16 DIAGNOSIS — F1020 Alcohol dependence, uncomplicated: Secondary | ICD-10-CM

## 2024-08-17 MED ORDER — NALTREXONE HCL 50 MG OR TABS: 50.0000 mg | ORAL_TABLET | Freq: Every day | ORAL | 0 refills | Status: DC

## 2024-08-18 MED ORDER — SEMAGLUTIDE 3 MG OR TABS: 3.0000 mg | ORAL_TABLET | Freq: Every morning | ORAL | 0 refills | Status: AC

## 2024-08-18 MED ORDER — NALTREXONE HCL 50 MG OR TABS: 100.0000 mg | ORAL_TABLET | Freq: Every day | ORAL | 0 refills | Status: AC

## 2024-08-18 MED ORDER — SEMAGLUTIDE 3 MG OR TABS: 3.0000 mg | ORAL_TABLET | Freq: Every morning | ORAL | 0 refills | Status: DC

## 2024-08-18 NOTE — Telephone Encounter (Signed)
 Incoming call from Ronnie Mason and Ronnie Mason , Has been taking 100 mg naltrexone , no side effects going well.     Requesting 100 mg to pharmacy as well as Rybelsus .     12/26 chart note:   Increase naltrexone  from 50 mg to 100 mg, Luka will reach out to us  regarding how he feels on it - has never felt any nausea or side effects with naltrexone  50 mg or injections in the past  Will prescribe GLP-1 Rybelsus  in the new year (08/04/2024) after trial of increased naltrexone ; discussed risks and benefits and also other forms including injections    Pharmacy pended.     FYI please cancel 50 MG that was sent in yesterday    Routing to provider

## 2024-08-18 NOTE — Telephone Encounter (Signed)
 Hyperlipidemia, unspecified hyperlipidemia type  Alcohol  use disorder, severe, dependence (HCC)  Abnormal weight gain  BMI 28.0-28.9,adult  Sent in naltrexone  100 mg daily as discussed during last visit, canceled 50 mg rx  Sent in Rybelsus  3 mg tablet - will await any information from insurance if PA is needed  - semaglutide  (Rybelsus ) 3 MG tablet; Take 1 tablet (3 mg) by mouth every morning. Take on an empty stomach, 30 minutes before the first meal, drink, or other oral medications.  Dispense: 30 tablet; Refill: 0  - naltrexone  50 MG tablet; Take 2 tablets (100 mg) by mouth daily.  Dispense: 180 tablet; Refill: 0  Ronnald Killian, ARNP

## 2024-08-19 ENCOUNTER — Telehealth (INDEPENDENT_AMBULATORY_CARE_PROVIDER_SITE_OTHER): Payer: Self-pay | Admitting: Nurse Practitioner

## 2024-08-22 NOTE — Telephone Encounter (Signed)
 Routing to front desk team    Please reschedule appointment on 1/27 - unfortunately I will not be in clinic. Okay for telemedicine

## 2024-08-24 ENCOUNTER — Other Ambulatory Visit (INDEPENDENT_AMBULATORY_CARE_PROVIDER_SITE_OTHER): Payer: Self-pay | Admitting: Nurse Practitioner

## 2024-08-24 DIAGNOSIS — N3941 Urge incontinence: Secondary | ICD-10-CM

## 2024-08-24 NOTE — Telephone Encounter (Signed)
 Spoke to pt and rescheduled appt    Passed on message sent via mychart    Pt expressed understanding    Nothing Further Needed    Closing Encounter

## 2024-08-25 ENCOUNTER — Other Ambulatory Visit (INDEPENDENT_AMBULATORY_CARE_PROVIDER_SITE_OTHER): Payer: Self-pay | Admitting: Family Medicine

## 2024-08-25 DIAGNOSIS — I10 Essential (primary) hypertension: Secondary | ICD-10-CM

## 2024-08-28 MED ORDER — LISINOPRIL 20 MG OR TABS: 20.0000 mg | ORAL_TABLET | Freq: Two times a day (BID) | ORAL | 1 refills | Status: AC

## 2024-08-28 MED ORDER — OXYBUTYNIN CHLORIDE ER 5 MG OR TB24: 5.0000 mg | EXTENDED_RELEASE_TABLET | Freq: Every day | ORAL | 0 refills | Status: AC

## 2024-08-29 ENCOUNTER — Encounter (INDEPENDENT_AMBULATORY_CARE_PROVIDER_SITE_OTHER): Admitting: Nurse Practitioner

## 2024-09-01 ENCOUNTER — Ambulatory Visit (INDEPENDENT_AMBULATORY_CARE_PROVIDER_SITE_OTHER): Admitting: Nurse Practitioner

## 2024-09-01 ENCOUNTER — Encounter (INDEPENDENT_AMBULATORY_CARE_PROVIDER_SITE_OTHER): Payer: Self-pay | Admitting: Nurse Practitioner

## 2024-09-01 VITALS — BP 104/65 | HR 76 | Temp 97.2°F | Ht 73.0 in | Wt 232.8 lb

## 2024-09-01 DIAGNOSIS — Z6828 Body mass index (BMI) 28.0-28.9, adult: Secondary | ICD-10-CM

## 2024-09-01 DIAGNOSIS — R635 Abnormal weight gain: Secondary | ICD-10-CM

## 2024-09-01 DIAGNOSIS — E785 Hyperlipidemia, unspecified: Secondary | ICD-10-CM

## 2024-09-01 DIAGNOSIS — F1020 Alcohol dependence, uncomplicated: Secondary | ICD-10-CM

## 2024-09-01 MED ORDER — ZEPBOUND 2.5 MG/0.5ML SC SOLN: 2.5000 mg | SUBCUTANEOUS | 0 refills | Status: AC

## 2024-09-16 ENCOUNTER — Ambulatory Visit

## 2024-09-27 ENCOUNTER — Ambulatory Visit: Admitting: Medical

## 2024-10-13 ENCOUNTER — Ambulatory Visit: Admitting: Anesthesiology
